# Patient Record
Sex: Male | Born: 1960
Health system: Southern US, Community
[De-identification: ages and names within clinical notes are randomized; demographics above are authoritative.]

## PROBLEM LIST (undated history)

## (undated) DIAGNOSIS — G4733 Obstructive sleep apnea (adult) (pediatric): Secondary | ICD-10-CM

## (undated) DIAGNOSIS — Z9889 Other specified postprocedural states: Secondary | ICD-10-CM

## (undated) DIAGNOSIS — L97509 Non-pressure chronic ulcer of other part of unspecified foot with unspecified severity: Secondary | ICD-10-CM

## (undated) DIAGNOSIS — I10 Essential (primary) hypertension: Secondary | ICD-10-CM

## (undated) DIAGNOSIS — M2040 Other hammer toe(s) (acquired), unspecified foot: Secondary | ICD-10-CM

## (undated) DIAGNOSIS — E119 Type 2 diabetes mellitus without complications: Secondary | ICD-10-CM

## (undated) DIAGNOSIS — I509 Heart failure, unspecified: Secondary | ICD-10-CM

## (undated) DIAGNOSIS — F1210 Cannabis abuse, uncomplicated: Secondary | ICD-10-CM

## (undated) DIAGNOSIS — R0609 Other forms of dyspnea: Secondary | ICD-10-CM

## (undated) DIAGNOSIS — G5603 Carpal tunnel syndrome, bilateral upper limbs: Secondary | ICD-10-CM

## (undated) DIAGNOSIS — K5792 Diverticulitis of intestine, part unspecified, without perforation or abscess without bleeding: Secondary | ICD-10-CM

## (undated) DIAGNOSIS — R748 Abnormal levels of other serum enzymes: Secondary | ICD-10-CM

## (undated) DIAGNOSIS — K219 Gastro-esophageal reflux disease without esophagitis: Secondary | ICD-10-CM

## (undated) DIAGNOSIS — F10930 Alcohol use, unspecified with withdrawal, uncomplicated: Secondary | ICD-10-CM

## (undated) DIAGNOSIS — R0600 Dyspnea, unspecified: Secondary | ICD-10-CM

## (undated) DIAGNOSIS — K760 Fatty (change of) liver, not elsewhere classified: Secondary | ICD-10-CM

## (undated) DIAGNOSIS — E785 Hyperlipidemia, unspecified: Secondary | ICD-10-CM

## (undated) DIAGNOSIS — M1990 Unspecified osteoarthritis, unspecified site: Secondary | ICD-10-CM

## (undated) DIAGNOSIS — M25559 Pain in unspecified hip: Secondary | ICD-10-CM

## (undated) DIAGNOSIS — R112 Nausea with vomiting, unspecified: Secondary | ICD-10-CM

## (undated) DIAGNOSIS — R600 Localized edema: Secondary | ICD-10-CM

## (undated) DIAGNOSIS — F1020 Alcohol dependence, uncomplicated: Secondary | ICD-10-CM

## (undated) DIAGNOSIS — G709 Myoneural disorder, unspecified: Secondary | ICD-10-CM

## (undated) DIAGNOSIS — Z8719 Personal history of other diseases of the digestive system: Secondary | ICD-10-CM

## (undated) HISTORY — DX: Essential (primary) hypertension: I10

## (undated) HISTORY — PX: COLONOSCOPY: SHX174

## (undated) HISTORY — DX: Other specified postprocedural states: Z98.890

## (undated) HISTORY — DX: Personal history of other diseases of the digestive system: Z87.19

## (undated) HISTORY — DX: Diverticulitis of intestine, part unspecified, without perforation or abscess without bleeding: K57.92

## (undated) HISTORY — PX: TOTAL KNEE ARTHROPLASTY: SHX125

## (undated) HISTORY — DX: Unspecified osteoarthritis, unspecified site: M19.90

## (undated) HISTORY — PX: JOINT REPLACEMENT: SHX530

---

## 2012-10-18 HISTORY — PX: BICEPS TENDON REPAIR: SHX566

## 2013-09-17 HISTORY — PX: TOE AMPUTATION: SHX809

## 2013-11-11 DIAGNOSIS — Q6410 Exstrophy of urinary bladder, unspecified: Secondary | ICD-10-CM | POA: Insufficient documentation

## 2013-11-11 DIAGNOSIS — M2040 Other hammer toe(s) (acquired), unspecified foot: Secondary | ICD-10-CM | POA: Insufficient documentation

## 2014-10-18 DIAGNOSIS — Z9889 Other specified postprocedural states: Secondary | ICD-10-CM

## 2014-10-18 DIAGNOSIS — Z8719 Personal history of other diseases of the digestive system: Secondary | ICD-10-CM

## 2014-10-18 HISTORY — DX: Personal history of other diseases of the digestive system: Z98.890

## 2014-10-18 HISTORY — DX: Personal history of other diseases of the digestive system: Z87.19

## 2014-10-18 HISTORY — PX: HERNIA REPAIR: SHX51

## 2016-02-06 DIAGNOSIS — M79642 Pain in left hand: Secondary | ICD-10-CM | POA: Diagnosis not present

## 2016-05-21 DIAGNOSIS — M792 Neuralgia and neuritis, unspecified: Secondary | ICD-10-CM | POA: Diagnosis not present

## 2016-05-21 DIAGNOSIS — L97512 Non-pressure chronic ulcer of other part of right foot with fat layer exposed: Secondary | ICD-10-CM | POA: Diagnosis not present

## 2016-05-21 DIAGNOSIS — M2011 Hallux valgus (acquired), right foot: Secondary | ICD-10-CM | POA: Diagnosis not present

## 2016-05-26 DIAGNOSIS — L97519 Non-pressure chronic ulcer of other part of right foot with unspecified severity: Secondary | ICD-10-CM | POA: Diagnosis not present

## 2016-06-17 DIAGNOSIS — M2011 Hallux valgus (acquired), right foot: Secondary | ICD-10-CM | POA: Diagnosis not present

## 2016-06-17 DIAGNOSIS — L97512 Non-pressure chronic ulcer of other part of right foot with fat layer exposed: Secondary | ICD-10-CM | POA: Diagnosis not present

## 2016-07-05 DIAGNOSIS — L97511 Non-pressure chronic ulcer of other part of right foot limited to breakdown of skin: Secondary | ICD-10-CM | POA: Diagnosis not present

## 2016-11-23 ENCOUNTER — Encounter: Payer: Self-pay | Admitting: Internal Medicine

## 2016-11-23 ENCOUNTER — Ambulatory Visit (INDEPENDENT_AMBULATORY_CARE_PROVIDER_SITE_OTHER): Payer: BLUE CROSS/BLUE SHIELD | Admitting: Internal Medicine

## 2016-11-23 ENCOUNTER — Other Ambulatory Visit (INDEPENDENT_AMBULATORY_CARE_PROVIDER_SITE_OTHER): Payer: BLUE CROSS/BLUE SHIELD

## 2016-11-23 VITALS — BP 158/104 | HR 77 | Temp 98.0°F | Resp 18 | Ht 73.0 in | Wt 304.0 lb

## 2016-11-23 DIAGNOSIS — IMO0002 Reserved for concepts with insufficient information to code with codable children: Secondary | ICD-10-CM

## 2016-11-23 DIAGNOSIS — G629 Polyneuropathy, unspecified: Secondary | ICD-10-CM | POA: Diagnosis not present

## 2016-11-23 DIAGNOSIS — R200 Anesthesia of skin: Secondary | ICD-10-CM

## 2016-11-23 DIAGNOSIS — Z89421 Acquired absence of other right toe(s): Secondary | ICD-10-CM | POA: Diagnosis not present

## 2016-11-23 DIAGNOSIS — Z23 Encounter for immunization: Secondary | ICD-10-CM

## 2016-11-23 DIAGNOSIS — R202 Paresthesia of skin: Secondary | ICD-10-CM

## 2016-11-23 DIAGNOSIS — G4733 Obstructive sleep apnea (adult) (pediatric): Secondary | ICD-10-CM

## 2016-11-23 DIAGNOSIS — I10 Essential (primary) hypertension: Secondary | ICD-10-CM

## 2016-11-23 DIAGNOSIS — Z8739 Personal history of other diseases of the musculoskeletal system and connective tissue: Secondary | ICD-10-CM | POA: Insufficient documentation

## 2016-11-23 LAB — COMPREHENSIVE METABOLIC PANEL
ALBUMIN: 4.2 g/dL (ref 3.5–5.2)
ALT: 126 U/L — ABNORMAL HIGH (ref 0–53)
AST: 126 U/L — ABNORMAL HIGH (ref 0–37)
Alkaline Phosphatase: 106 U/L (ref 39–117)
BUN: 10 mg/dL (ref 6–23)
CALCIUM: 10 mg/dL (ref 8.4–10.5)
CO2: 28 mEq/L (ref 19–32)
Chloride: 104 mEq/L (ref 96–112)
Creatinine, Ser: 0.86 mg/dL (ref 0.40–1.50)
GFR: 98.01 mL/min (ref >60.00)
Glucose, Bld: 143 mg/dL — ABNORMAL HIGH (ref 70–99)
POTASSIUM: 4.1 meq/L (ref 3.5–5.1)
Sodium: 138 mEq/L (ref 135–145)
TOTAL PROTEIN: 8.2 g/dL (ref 6.0–8.3)
Total Bilirubin: 1 mg/dL (ref 0.2–1.2)

## 2016-11-23 LAB — CBC WITH DIFFERENTIAL/PLATELET
Basophils Absolute: 0.1 10*3/uL (ref 0.0–0.1)
Basophils Relative: 0.9 % (ref 0.0–3.0)
EOS PCT: 1.2 % (ref 0.0–5.0)
Eosinophils Absolute: 0.1 10*3/uL (ref 0.0–0.7)
HCT: 48.5 % (ref 39.0–52.0)
HEMOGLOBIN: 16.8 g/dL (ref 13.0–17.0)
Lymphocytes Relative: 20.5 % (ref 12.0–46.0)
Lymphs Abs: 1.6 10*3/uL (ref 0.7–4.0)
MCHC: 34.7 g/dL (ref 30.0–36.0)
MCV: 105.3 fl — AB (ref 78.0–100.0)
MONOS PCT: 10.4 % (ref 3.0–12.0)
Monocytes Absolute: 0.8 10*3/uL (ref 0.1–1.0)
Neutro Abs: 5.3 10*3/uL (ref 1.4–7.7)
Neutrophils Relative %: 67 % (ref 43.0–77.0)
Platelets: 281 10*3/uL (ref 150.0–400.0)
RBC: 4.61 Mil/uL (ref 4.22–5.81)
RDW: 13.9 % (ref 11.5–15.5)
WBC: 7.9 10*3/uL (ref 4.0–10.5)

## 2016-11-23 LAB — VITAMIN D 25 HYDROXY (VIT D DEFICIENCY, FRACTURES): VITD: 16.46 ng/mL — AB (ref 30.00–100.00)

## 2016-11-23 LAB — LIPID PANEL
Cholesterol: 255 mg/dL — ABNORMAL HIGH (ref 0–200)
HDL: 49.7 mg/dL (ref >39.00)
LDL Cholesterol: 177 mg/dL — ABNORMAL HIGH (ref 0–99)
NONHDL: 204.93
TRIGLYCERIDES: 142 mg/dL (ref 0.0–149.0)
Total CHOL/HDL Ratio: 5
VLDL: 28.4 mg/dL (ref 0.0–40.0)

## 2016-11-23 LAB — HEMOGLOBIN A1C: HEMOGLOBIN A1C: 6.4 % (ref 4.6–6.5)

## 2016-11-23 LAB — VITAMIN B12: Vitamin B-12: 356 pg/mL (ref 211–911)

## 2016-11-23 LAB — TSH: TSH: 3.28 u[IU]/mL (ref 0.35–4.50)

## 2016-11-23 NOTE — Assessment & Plan Note (Addendum)
Stressed regular exercise and weight loss Check a1c and basic labs

## 2016-11-23 NOTE — Progress Notes (Signed)
Pre visit review using our clinic review tool, if applicable. No additional management support is needed unless otherwise documented below in the visit note. 

## 2016-11-23 NOTE — Progress Notes (Signed)
Subjective:    Patient ID: [name], male    DOB: 10/02/1961, 56 y.o.   MRN: [id number]  HPI He is here to establish with a new pcp.    Numbness/tingling in left hand:  It started two years ago.  His prior pcp thought it was carpal tunnel.  He has weakness in his first two first fingers.  The numbness has worsened and now goes up his arm. He did have an EMG 1-2 years ago diagnosed with CTS, but he thinks it is coming from his neck.  If he lays down at night it is worse.    Weight gain, obese:  Since July he has gained 50 lbs.  He is less active and has not been eating well.   Hypertension: He is taking his medication daily. He is compliant with a low sodium diet.  He has had leg edema for two months.  He denies chest pain, palpitations, shortness of breath and regular headaches. He is not exercising regularly.  He does not monitor his blood pressure at home.     Medications and allergies reviewed with patient and updated if appropriate.  Patient Active Problem List   Diagnosis Date Noted  . Hypertension 11/23/2016  . Neuropathy (HCC) 11/23/2016  . Toe amputation status, right (HCC) 11/23/2016    No current outpatient prescriptions on file prior to visit.   No current facility-administered medications on file prior to visit.     Past Medical History:  Diagnosis Date  . Arthritis   . Diverticulitis   . Hypertension     Past Surgical History:  Procedure Laterality Date  . HERNIA REPAIR    . JOINT REPLACEMENT      Social History   Social History  . Marital status: Divorced    Spouse name: N/A  . Number of children: N/A  . Years of education: N/A   Social History Main Topics  . Smoking status: Former Games developermoker  . Smokeless tobacco: Never Used  . Alcohol use Yes  . Drug use: No  . Sexual activity: Not Asked   Other Topics Concern  . None   Social History Narrative  . None    History reviewed. No pertinent family history.  Review of Systems    Constitutional: Negative for chills and fever.  HENT: Positive for postnasal drip (in morning).   Eyes: Negative for visual disturbance.  Respiratory: Positive for cough (in morning from PND). Negative for shortness of breath and wheezing.   Cardiovascular: Positive for leg swelling (daily for a couple of months). Negative for chest pain and palpitations.  Gastrointestinal: Negative for abdominal pain, blood in stool, constipation, diarrhea and nausea.       Occ gerd  Endocrine: Negative for polydipsia and polyuria.  Genitourinary: Negative for dysuria and hematuria.  Musculoskeletal: Positive for back pain.  Neurological: Positive for weakness (left hand) and numbness (b/l feet, left hand). Negative for dizziness, light-headedness and headaches.  Psychiatric/Behavioral: Positive for dysphoric mood (from not working). The patient is not nervous/anxious.        Objective:   Vitals:   11/23/16 1028  BP: (!) 158/104  Pulse: 77  Resp: 18  Temp: 98 F (36.7 C)   Filed Weights   11/23/16 1028  Weight: (!) 304 lb (137.9 kg)   Body mass index is 40.11 kg/m.  Wt Readings from Last 3 Encounters:  11/23/16 (!) 304 lb (137.9 kg)     Physical Exam Constitutional: He appears well-developed and well-nourished.  No distress.  HENT:  Head: Normocephalic and atraumatic.  Right Ear: External ear normal.  Left Ear: External ear normal.  Mouth/Throat: Oropharynx is clear and moist.  Normal ear canals and TM b/l  Eyes: Conjunctivae and EOM are normal.  Neck: Neck supple. No tracheal deviation present. No thyromegaly present.  No carotid bruit  Cardiovascular: Normal rate, regular rhythm, normal heart sounds and intact distal pulses.   No murmur heard. Pulmonary/Chest: Effort normal and breath sounds normal. No respiratory distress. He has no wheezes. He has no rales.  Abdominal: Soft. Obese, ventral hernia. He exhibits no distension. There is no tenderness.  Genitourinary: deferred   Musculoskeletal: He exhibits 1+ pitting b/l le edema.  Lymphadenopathy:    He has no cervical adenopathy.  Skin: Skin is warm and dry. He is not diaphoretic.  Psychiatric: He has a normal mood and affect. His behavior is normal.         Assessment & Plan:   Colonoscopy 2016  See Problem List for Assessment and Plan of chronic medical problems.

## 2016-11-23 NOTE — Assessment & Plan Note (Signed)
Stressed weight loss Discussed concerns of untreated OSA

## 2016-11-23 NOTE — Assessment & Plan Note (Signed)
Elevated today - he does not check at home  ? Controlled Start monitoring at home Work on increasing exercise, weight loss Low sodium diet Check labs

## 2016-11-23 NOTE — Assessment & Plan Note (Signed)
Check basic labs and B12 Work on weight loss

## 2016-11-23 NOTE — Patient Instructions (Addendum)
  Test(s) ordered today. Your results will be released to MyChart (or called to you) after review, usually within 72hours after test completion. If any changes need to be made, you will be notified at that same time.   Tetanus immunization administered today.   Medications reviewed and updated.  No changes recommended at this time.   A referral was ordered for orthopedics  Please followup in 1 months

## 2016-11-23 NOTE — Assessment & Plan Note (Signed)
Has been diagnosed with CTS by EMG - he feels it is likely from his neck  Will refer to ortho - most likely he has CTS, ? Some degree of cervical radiculopathy

## 2016-11-25 ENCOUNTER — Other Ambulatory Visit: Payer: Self-pay | Admitting: Internal Medicine

## 2016-11-25 DIAGNOSIS — E7800 Pure hypercholesterolemia, unspecified: Secondary | ICD-10-CM

## 2016-11-25 DIAGNOSIS — R748 Abnormal levels of other serum enzymes: Secondary | ICD-10-CM | POA: Insufficient documentation

## 2016-11-25 DIAGNOSIS — E785 Hyperlipidemia, unspecified: Secondary | ICD-10-CM | POA: Insufficient documentation

## 2016-11-25 DIAGNOSIS — R7303 Prediabetes: Secondary | ICD-10-CM | POA: Insufficient documentation

## 2016-11-25 DIAGNOSIS — E119 Type 2 diabetes mellitus without complications: Secondary | ICD-10-CM

## 2016-12-02 ENCOUNTER — Ambulatory Visit (INDEPENDENT_AMBULATORY_CARE_PROVIDER_SITE_OTHER): Payer: BLUE CROSS/BLUE SHIELD | Admitting: Orthopaedic Surgery

## 2016-12-02 ENCOUNTER — Encounter (INDEPENDENT_AMBULATORY_CARE_PROVIDER_SITE_OTHER): Payer: Self-pay | Admitting: Orthopaedic Surgery

## 2016-12-02 ENCOUNTER — Ambulatory Visit (INDEPENDENT_AMBULATORY_CARE_PROVIDER_SITE_OTHER): Payer: BLUE CROSS/BLUE SHIELD

## 2016-12-02 ENCOUNTER — Ambulatory Visit (INDEPENDENT_AMBULATORY_CARE_PROVIDER_SITE_OTHER): Payer: Self-pay

## 2016-12-02 DIAGNOSIS — M79641 Pain in right hand: Secondary | ICD-10-CM | POA: Diagnosis not present

## 2016-12-02 DIAGNOSIS — M79642 Pain in left hand: Secondary | ICD-10-CM | POA: Diagnosis not present

## 2016-12-02 NOTE — Progress Notes (Signed)
Office Visit Note   Patient: [name]           Date of Birth: 07/07/1961           MRN: [id number] Visit Date: 12/02/2016              Requested by: Pincus SanesStacy J Burns, MD [address] [place], [place] [id number] PCP: Pincus SanesStacy J Burns, MD   Assessment & Plan: Visit Diagnoses:  1. Bilateral hand pain     Plan: Nerve conduction study with Dr. Alvester MorinNewton to assess severity. We will have him follow-up afterwards to discuss treatment options.  Follow-Up Instructions: Return in about 7 days (around 12/09/2016).   Orders:  Orders Placed This Encounter  Procedures  . XR Hand Complete Left  . XR Hand Complete Right  . Ambulatory referral to Physical Medicine Rehab   No orders of the defined types were placed in this encounter.     Procedures: No procedures performed   Clinical Data: No additional findings.   Subjective: Chief Complaint  Patient presents with  . Left Hand - Pain, Tingling, Numbness  . Right Hand - Pain, Numbness, Tingling    Patient is a 56 year old gentleman with 2 year history of bilateral hand pain and numbness tingling burning that has been previously diagnosed with carpal tunnel syndrome by nerve conduction studies. He has had injections with good relief. He has not gotten any relief from wearing braces at night. The pain is getting worse. He starting to get numbness in his left hand and decreased protective sensation. The pain radiates up the arm. Symptoms are worse with repetitive use of the hand. He is a Investment banker, operationalchef.    Review of Systems  Constitutional: Negative.   All other systems reviewed and are negative.    Objective: Vital Signs: There were no vitals taken for this visit.  Physical Exam  Constitutional: He is oriented to person, place, and time. He appears well-developed and well-nourished.  HENT:  Head: Normocephalic and atraumatic.  Eyes: Pupils are equal, round, and reactive to light.  Neck: Neck supple.  Pulmonary/Chest: Effort normal.    Abdominal: Soft.  Musculoskeletal: Normal range of motion.  Neurological: He is alert and oriented to person, place, and time.  Skin: Skin is warm.  Psychiatric: He has a normal mood and affect. His behavior is normal. Judgment and thought content normal.  Nursing note and vitals reviewed.   Ortho Exam Exam by hand shows no muscular atrophy. He does have positive carpal tunnel tunnel compressive signs. He does have a burn to his left index finger which she said he has decreased sensation with. Specialty Comments:  No specialty comments available.  Imaging: Xr Hand Complete Left  Result Date: 12/02/2016 No acute findings.  Mild OA of thumb CMC joint  Xr Hand Complete Right  Result Date: 12/02/2016 No acute findings.  Mild OA of thumb CMC joint    PMFS History: Patient Active Problem List   Diagnosis Date Noted  . Hyperlipidemia 11/25/2016  . Elevated liver enzymes 11/25/2016  . Prediabetes 11/25/2016  . Hypertension 11/23/2016  . Neuropathy (HCC) 11/23/2016  . Toe amputation status, right (HCC) 11/23/2016  . Morbid obesity (HCC) 11/23/2016  . Numbness and tingling in left hand 11/23/2016  . OSA (obstructive sleep apnea) 11/23/2016   Past Medical History:  Diagnosis Date  . Arthritis   . Diverticulitis    portion of colon removed  . History of ventral hernia repair 2016  . Hypertension  Family History  Problem Relation Age of Onset  . Adopted: Yes  . Family history unknown: Yes    Past Surgical History:  Procedure Laterality Date  . HERNIA REPAIR  2016   ventral  . JOINT REPLACEMENT     b/l knees    Social History   Occupational History  . Not on file.   Social History Main Topics  . Smoking status: Former Games developer  . Smokeless tobacco: Never Used  . Alcohol use Yes     Comment: 10 / week  . Drug use: No  . Sexual activity: Not on file

## 2016-12-06 ENCOUNTER — Ambulatory Visit (INDEPENDENT_AMBULATORY_CARE_PROVIDER_SITE_OTHER): Payer: Self-pay | Admitting: Orthopaedic Surgery

## 2016-12-07 ENCOUNTER — Encounter: Payer: Self-pay | Admitting: Internal Medicine

## 2016-12-08 ENCOUNTER — Telehealth (INDEPENDENT_AMBULATORY_CARE_PROVIDER_SITE_OTHER): Payer: Self-pay | Admitting: Physical Medicine and Rehabilitation

## 2016-12-08 ENCOUNTER — Encounter (INDEPENDENT_AMBULATORY_CARE_PROVIDER_SITE_OTHER): Payer: BLUE CROSS/BLUE SHIELD | Admitting: Physical Medicine and Rehabilitation

## 2016-12-08 NOTE — Telephone Encounter (Signed)
Patient called and said he forgot his appointment for today at 3:15. He was wondering if he could schedule another appointment in the next few days. [phone]

## 2016-12-09 NOTE — Telephone Encounter (Signed)
Rescheduled for 2/26 at 1430.

## 2016-12-09 NOTE — Telephone Encounter (Signed)
Called patient and left a message for him to call back to reschedule.

## 2016-12-13 ENCOUNTER — Encounter (INDEPENDENT_AMBULATORY_CARE_PROVIDER_SITE_OTHER): Payer: Self-pay | Admitting: Physical Medicine and Rehabilitation

## 2016-12-13 ENCOUNTER — Ambulatory Visit (INDEPENDENT_AMBULATORY_CARE_PROVIDER_SITE_OTHER): Payer: BLUE CROSS/BLUE SHIELD | Admitting: Orthopaedic Surgery

## 2016-12-13 ENCOUNTER — Ambulatory Visit (INDEPENDENT_AMBULATORY_CARE_PROVIDER_SITE_OTHER): Payer: BLUE CROSS/BLUE SHIELD | Admitting: Physical Medicine and Rehabilitation

## 2016-12-13 DIAGNOSIS — R202 Paresthesia of skin: Secondary | ICD-10-CM

## 2016-12-13 NOTE — Progress Notes (Signed)
[name] - 56 y.o. male MRN [id number]  Date of birth: Feb 20, 1961  Office Visit Note: Visit Date: 12/13/2016 PCP: [name], MD Referred by: [name], MD  Subjective: Chief Complaint  Patient presents with  . Right Hand - Numbness  . Left Hand - Numbness   HPI: Mr. Khim is a 56 year old left-hand dominant woman with chronic worsening persistent pain numbness and tingling in both hands. He gets a lot of numbness and pain that will go up from the hand to the elbow particularly on the left. His left thumb and index finger is constantly numb at this point. He has difficulty picking up small objects and manipulating buttons etc. he states his symptoms probably started about a year ago and has been getting progressively worse. He has tried hand splints before and anti-inflammatories and pain medication without relief. He feels like he did have prior electrodiagnostic studies several years ago but does not have those studies on hand. He does carry a diagnosis of poly-peripheral neuropathy but does not have diabetes. He actually reports having to have a toe amputated because of nonhealing ulcer.   ROS Otherwise per HPI.  Assessment & Plan: Visit Diagnoses:  1. Paresthesia of skin     Plan: No additional findings.  Impression: The above electrodiagnostic study is ABNORMAL and reveals evidence of:  1.  A severe left median nerve entrapment at the wrist (carpal tunnel syndrome) affecting sensory and motor components. The lesion is characterized by sensory and motor demyelination with evidence of significant axonal injury. **Even with appropriate decompression residual symptoms may persist.  2.  A moderate to severe right median nerve entrapment at the wrist (carpal tunnel syndrome) affecting sensory and motor components.There is no significant electrodiagnostic evidence of any other focal nerve entrapment, brachial plexopathy or cervical radiculopathy.    Recommendations: 1.   Follow-up with referring physician. 2.  Continue current management of symptoms. 3.  Suggest surgical evaluation.   Meds & Orders: No orders of the defined types were placed in this encounter.   Orders Placed This Encounter  Procedures  . NCV with EMG (electromyography)    Follow-up: Return for Scheduled follow up with Dr. Roda Shutters.   Procedures: No procedures performed  EMG & NCV Findings: Evaluation of the left median motor nerve showed prolonged distal onset latency (17.3 ms) and reduced amplitude (0.4 mV).  The right median motor nerve showed prolonged distal onset latency (9.0 ms) and decreased conduction velocity (Elbow-Wrist, 44 m/s).  The left median (across palm) sensory nerve showed no response (Wrist) and prolonged distal peak latency (Palm, 7.5 ms).  The right median (across palm) sensory nerve showed prolonged distal peak latency (Wrist, 9.8 ms), reduced amplitude (1.5 V), and prolonged distal peak latency (Palm, 5.1 ms).  The left ulnar sensory and the right ulnar sensory nerves showed reduced amplitude (L11.1, R5.6 V).  All remaining nerves (as indicated in the following tables) were within normal limits.  Left vs. Right side comparison data for the median motor nerve indicates abnormal L-R latency difference (8.3 ms) and abnormal L-R amplitude difference (94.4 %).  The ulnar motor nerve indicates abnormal L-R amplitude difference (31.4 %) and abnormal L-R velocity difference (A Elbow-B Elbow, 22 m/s).  All remaining left vs. right side differences were within normal limits.    Needle evaluation of the left abductor pollicis brevis muscle showed decreased insertional activity, moderately increased spontaneous activity, decreased motor unit amplitude, and diminished recruitment.  All remaining muscles (as indicated in  the following table) showed no evidence of electrical instability.    Impression: The above electrodiagnostic study is ABNORMAL and reveals evidence of:  1.  A severe  left median nerve entrapment at the wrist (carpal tunnel syndrome) affecting sensory and motor components. The lesion is characterized by sensory and motor demyelination with evidence of significant axonal injury. **Even with appropriate decompression residual symptoms may persist.  2.  A moderate to severe right median nerve entrapment at the wrist (carpal tunnel syndrome) affecting sensory and motor components.There is no significant electrodiagnostic evidence of any other focal nerve entrapment, brachial plexopathy or cervical radiculopathy.    Recommendations: 1.  Follow-up with referring physician. 2.  Continue current management of symptoms. 3.  Suggest surgical evaluation.   Nerve Conduction Studies Anti Sensory Summary Table   Stim Site NR Peak (ms) Norm Peak (ms) P-T Amp (V) Norm P-T Amp Site1 Site2 Delta-P (ms) Dist (cm) Vel (m/s) Norm Vel (m/s)  Left Median Acr Palm Anti Sensory (2nd Digit)  32.6C  Wrist *NR  <3.6  >10 Wrist Palm  0.0    Palm    *7.5 <2.0 18.3         Right Median Acr Palm Anti Sensory (2nd Digit)  31.7C  Wrist    *9.8 <3.6 *1.5 >10 Wrist Palm 4.7 0.0    Palm    *5.1 <2.0 23.7         Left Radial Anti Sensory (Base 1st Digit)  32.1C  Wrist    2.1 <3.1 16.7  Wrist Base 1st Digit 2.1 0.0    Right Radial Anti Sensory (Base 1st Digit)  32.4C  Wrist    2.2 <3.1 15.5  Wrist Base 1st Digit 2.2 0.0    Left Ulnar Anti Sensory (5th Digit)  32.6C  Wrist    3.4 <3.7 *11.1 >15.0 Wrist 5th Digit 3.4 14.0 41 >38  Right Ulnar Anti Sensory (5th Digit)  31.8C  Wrist    3.3 <3.7 *5.6 >15.0 Wrist 5th Digit 3.3 14.0 42 >38   Motor Summary Table   Stim Site NR Onset (ms) Norm Onset (ms) O-P Amp (mV) Norm O-P Amp Site1 Site2 Delta-0 (ms) Dist (cm) Vel (m/s) Norm Vel (m/s)  Left Median Motor (Abd Poll Brev)  32C  Wrist    *17.3 <4.2 *0.4 >5 Elbow Wrist 7.4 0.0  >50  Elbow    24.7  0.2         Right Median Motor (Abd Poll Brev)  32.3C  Wrist    *9.0 <4.2 7.2 >5 Elbow  Wrist 5.4 24.0 *44 >50  Elbow    14.4  4.6         Left Ulnar Motor (Abd Dig Min)  31.9C  Wrist    2.8 <4.2 8.6 >3 B Elbow Wrist 4.3 25.0 58 >53  B Elbow    7.1  8.2  A Elbow B Elbow 1.7 12.0 71 >53  A Elbow    8.8  8.0         Right Ulnar Motor (Abd Dig Min)  32.1C  Wrist    3.4 <4.2 5.9 >3 B Elbow Wrist 4.5 26.0 58 >53  B Elbow    7.9  6.6  A Elbow B Elbow 1.5 14.0 93 >53  A Elbow    9.4  8.1          EMG   Side Muscle Nerve Root Ins Act Fibs Psw Amp Dur Poly Recrt Int [name] Bible Comment  Left Abd [place]  Brev Median C8-T1 *Decr *2+ *2+ *Decr Nml 0 *Reduced Nml   Left 1stDorInt Ulnar C8-T1 Nml Nml Nml Nml Nml 0 Nml Nml   Left PronatorTeres Median C6-7 Nml Nml Nml Nml Nml 0 Nml Nml   Left Biceps Musculocut C5-6 Nml Nml Nml Nml Nml 0 Nml Nml   Left Deltoid Axillary C5-6 Nml Nml Nml Nml Nml 0 Nml Nml     Nerve Conduction Studies Anti Sensory Left/Right Comparison   Stim Site L Lat (ms) R Lat (ms) L-R Lat (ms) L Amp (V) R Amp (V) L-R Amp (%) Site1 Site2 L Vel (m/s) R Vel (m/s) L-R Vel (m/s)  Median Acr Palm Anti Sensory (2nd Digit)  32.6C  Wrist  *9.8   *1.5  Wrist Palm     Palm *7.5 *5.1 2.4 18.3 23.7 22.8       Radial Anti Sensory (Base 1st Digit)  32.1C  Wrist 2.1 2.2 0.1 16.7 15.5 7.2 Wrist Base 1st Digit     Ulnar Anti Sensory (5th Digit)  32.6C  Wrist 3.4 3.3 0.1 *11.1 *5.6 49.5 Wrist 5th Digit 41 42 1   Motor Left/Right Comparison   Stim Site L Lat (ms) R Lat (ms) L-R Lat (ms) L Amp (mV) R Amp (mV) L-R Amp (%) Site1 Site2 L Vel (m/s) R Vel (m/s) L-R Vel (m/s)  Median Motor (Abd Poll Brev)  32C  Wrist *17.3 *9.0 *8.3 *0.4 7.2 *94.4 Elbow Wrist  *44   Elbow 24.7 14.4 10.3 0.2 4.6 95.7       Ulnar Motor (Abd Dig Min)  31.9C  Wrist 2.8 3.4 0.6 8.6 5.9 *31.4 B Elbow Wrist 58 58 0  B Elbow 7.1 7.9 0.8 8.2 6.6 19.5 A Elbow B Elbow 71 93 *22  A Elbow 8.8 9.4 0.6 8.0 8.1 1.2             Clinical History: No specialty comments available.  He reports that he has quit  smoking. He has never used smokeless tobacco.   Recent Labs  11/23/16 1118  HGBA1C 6.4    Objective:  VS:  HT:    WT:   BMI:     BP:   HR: bpm  TEMP: ( )  RESP:  Physical Exam  Musculoskeletal:  Morbidly obese large individual with observed osteoarthritic changes in both hands as well as mild flattening of the right APB with atrophy of the left APB without atrophy of the FDI or hand intrinsics. There is no swelling, color changes, allodynia or dystrophic changes. There is 5 out of 5 strength in the bilateral wrist extension, finger abduction and long finger flexion. There is decreased sensation to light touch in a median nerve distribution left more than right. There is a negative Hoffmann's test bilaterally.    Ortho Exam Imaging: [place] Abdomen Limited Ruq  Result Date: 12/14/2016 CLINICAL DATA:  Elevated liver enzymes EXAM: US ABDOMEN LIMITED - RIGHT UPPER QUADRANT COMPARISON:  None. FINDINGS: Gallbladder: No gallstones or wall thickening visualized. No pericholecystic fluid. No sonographic Murphy sign noted by sonographer. Common bile duct: Diameter: 4 mm. No intrahepatic or extrahepatic biliary duct dilatation evident. Liver: No focal lesion identified. Liver echogenicity is increased diffusely. IMPRESSION: Diffuse increase in liver echogenicity, a finding most likely due to hepatic steatosis. While no focal liver lesions are evident, it must be cautioned that sensitivity of ultrasound for detection of focal liver lesions is diminished in this circumstance. Study otherwise unremarkable. Electronically Signed   By: Bretta BangWilliam  Woodruff  III M.D.   On: 12/14/2016 11:05    Past Medical/Family/Surgical/Social History: Medications & Allergies reviewed per EMR Patient Active Problem List   Diagnosis Date Noted  . Hyperlipidemia 11/25/2016  . Elevated liver enzymes 11/25/2016  . Prediabetes 11/25/2016  . Hypertension 11/23/2016  . Neuropathy (HCC) 11/23/2016  . Toe amputation status, right  (HCC) 11/23/2016  . Morbid obesity (HCC) 11/23/2016  . Numbness and tingling in left hand 11/23/2016  . OSA (obstructive sleep apnea) 11/23/2016   Past Medical History:  Diagnosis Date  . Arthritis   . Diverticulitis    portion of colon removed  . History of ventral hernia repair 2016  . Hypertension    Family History  Problem Relation Age of Onset  . Adopted: Yes  . Family history unknown: Yes   Past Surgical History:  Procedure Laterality Date  . HERNIA REPAIR  2016   ventral  . JOINT REPLACEMENT     b/l knees    Social History   Occupational History  . Not on file.   Social History Main Topics  . Smoking status: Former Games developer  . Smokeless tobacco: Never Used  . Alcohol use Yes     Comment: 10 / week  . Drug use: No  . Sexual activity: Not on file

## 2016-12-14 ENCOUNTER — Ambulatory Visit
Admission: RE | Admit: 2016-12-14 | Discharge: 2016-12-14 | Disposition: A | Discharge status: Home / Self Care | Payer: BLUE CROSS/BLUE SHIELD | Source: Ambulatory Visit | Attending: Internal Medicine | Admitting: Internal Medicine

## 2016-12-14 ENCOUNTER — Encounter (INDEPENDENT_AMBULATORY_CARE_PROVIDER_SITE_OTHER): Payer: BLUE CROSS/BLUE SHIELD | Admitting: Physical Medicine and Rehabilitation

## 2016-12-14 ENCOUNTER — Encounter: Payer: Self-pay | Admitting: Internal Medicine

## 2016-12-14 DIAGNOSIS — R748 Abnormal levels of other serum enzymes: Secondary | ICD-10-CM

## 2016-12-14 DIAGNOSIS — K760 Fatty (change of) liver, not elsewhere classified: Secondary | ICD-10-CM | POA: Insufficient documentation

## 2016-12-14 DIAGNOSIS — R945 Abnormal results of liver function studies: Secondary | ICD-10-CM | POA: Diagnosis not present

## 2016-12-14 NOTE — Procedures (Signed)
EMG & NCV Findings: Evaluation of the left median motor nerve showed prolonged distal onset latency (17.3 ms) and reduced amplitude (0.4 mV).  The right median motor nerve showed prolonged distal onset latency (9.0 ms) and decreased conduction velocity (Elbow-Wrist, 44 m/s).  The left median (across palm) sensory nerve showed no response (Wrist) and prolonged distal peak latency (Palm, 7.5 ms).  The right median (across palm) sensory nerve showed prolonged distal peak latency (Wrist, 9.8 ms), reduced amplitude (1.5 V), and prolonged distal peak latency (Palm, 5.1 ms).  The left ulnar sensory and the right ulnar sensory nerves showed reduced amplitude (L11.1, R5.6 V).  All remaining nerves (as indicated in the following tables) were within normal limits.  Left vs. Right side comparison data for the median motor nerve indicates abnormal L-R latency difference (8.3 ms) and abnormal L-R amplitude difference (94.4 %).  The ulnar motor nerve indicates abnormal L-R amplitude difference (31.4 %) and abnormal L-R velocity difference (A Elbow-B Elbow, 22 m/s).  All remaining left vs. right side differences were within normal limits.    Needle evaluation of the left abductor pollicis brevis muscle showed decreased insertional activity, moderately increased spontaneous activity, decreased motor unit amplitude, and diminished recruitment.  All remaining muscles (as indicated in the following table) showed no evidence of electrical instability.    Impression: The above electrodiagnostic study is ABNORMAL and reveals evidence of:  1.  A severe left median nerve entrapment at the wrist (carpal tunnel syndrome) affecting sensory and motor components. The lesion is characterized by sensory and motor demyelination with evidence of significant axonal injury. **Even with appropriate decompression residual symptoms may persist.  2.  A moderate to severe right median nerve entrapment at the wrist (carpal tunnel syndrome)  affecting sensory and motor components.There is no significant electrodiagnostic evidence of any other focal nerve entrapment, brachial plexopathy or cervical radiculopathy.    Recommendations: 1.  Follow-up with referring physician. 2.  Continue current management of symptoms. 3.  Suggest surgical evaluation.   Nerve Conduction Studies Anti Sensory Summary Table   Stim Site NR Peak (ms) Norm Peak (ms) P-T Amp (V) Norm P-T Amp Site1 Site2 Delta-P (ms) Dist (cm) Vel (m/s) Norm Vel (m/s)  Left Median Acr Palm Anti Sensory (2nd Digit)  32.6C  Wrist *NR  <3.6  >10 Wrist Palm  0.0    Palm    *7.5 <2.0 18.3         Right Median Acr Palm Anti Sensory (2nd Digit)  31.7C  Wrist    *9.8 <3.6 *1.5 >10 Wrist Palm 4.7 0.0    Palm    *5.1 <2.0 23.7         Left Radial Anti Sensory (Base 1st Digit)  32.1C  Wrist    2.1 <3.1 16.7  Wrist Base 1st Digit 2.1 0.0    Right Radial Anti Sensory (Base 1st Digit)  32.4C  Wrist    2.2 <3.1 15.5  Wrist Base 1st Digit 2.2 0.0    Left Ulnar Anti Sensory (5th Digit)  32.6C  Wrist    3.4 <3.7 *11.1 >15.0 Wrist 5th Digit 3.4 14.0 41 >38  Right Ulnar Anti Sensory (5th Digit)  31.8C  Wrist    3.3 <3.7 *5.6 >15.0 Wrist 5th Digit 3.3 14.0 42 >38   Motor Summary Table   Stim Site NR Onset (ms) Norm Onset (ms) O-P Amp (mV) Norm O-P Amp Site1 Site2 Delta-0 (ms) Dist (cm) Vel (m/s) Norm Vel (m/s)  Left Median Motor (Abd  Poll Brev)  32C  Wrist    *17.3 <4.2 *0.4 >5 Elbow Wrist 7.4 0.0  >50  Elbow    24.7  0.2         Right Median Motor (Abd Poll Brev)  32.3C  Wrist    *9.0 <4.2 7.2 >5 Elbow Wrist 5.4 24.0 *44 >50  Elbow    14.4  4.6         Left Ulnar Motor (Abd Dig Min)  31.9C  Wrist    2.8 <4.2 8.6 >3 B Elbow Wrist 4.3 25.0 58 >53  B Elbow    7.1  8.2  A Elbow B Elbow 1.7 12.0 71 >53  A Elbow    8.8  8.0         Right Ulnar Motor (Abd Dig Min)  32.1C  Wrist    3.4 <4.2 5.9 >3 B Elbow Wrist 4.5 26.0 58 >53  B Elbow    7.9  6.6  A Elbow B Elbow 1.5 14.0  93 >53  A Elbow    9.4  8.1          EMG   Side Muscle Nerve Root Ins Act Fibs Psw Amp Dur Poly Recrt Int [name] BiblePat Comment  Left Abd Poll Brev Median C8-T1 *Decr *2+ *2+ *Decr Nml 0 *Reduced Nml   Left 1stDorInt Ulnar C8-T1 Nml Nml Nml Nml Nml 0 Nml Nml   Left PronatorTeres Median C6-7 Nml Nml Nml Nml Nml 0 Nml Nml   Left Biceps Musculocut C5-6 Nml Nml Nml Nml Nml 0 Nml Nml   Left Deltoid Axillary C5-6 Nml Nml Nml Nml Nml 0 Nml Nml     Nerve Conduction Studies Anti Sensory Left/Right Comparison   Stim Site L Lat (ms) R Lat (ms) L-R Lat (ms) L Amp (V) R Amp (V) L-R Amp (%) Site1 Site2 L Vel (m/s) R Vel (m/s) L-R Vel (m/s)  Median Acr Palm Anti Sensory (2nd Digit)  32.6C  Wrist  *9.8   *1.5  Wrist Palm     Palm *7.5 *5.1 2.4 18.3 23.7 22.8       Radial Anti Sensory (Base 1st Digit)  32.1C  Wrist 2.1 2.2 0.1 16.7 15.5 7.2 Wrist Base 1st Digit     Ulnar Anti Sensory (5th Digit)  32.6C  Wrist 3.4 3.3 0.1 *11.1 *5.6 49.5 Wrist 5th Digit 41 42 1   Motor Left/Right Comparison   Stim Site L Lat (ms) R Lat (ms) L-R Lat (ms) L Amp (mV) R Amp (mV) L-R Amp (%) Site1 Site2 L Vel (m/s) R Vel (m/s) L-R Vel (m/s)  Median Motor (Abd Poll Brev)  32C  Wrist *17.3 *9.0 *8.3 *0.4 7.2 *94.4 Elbow Wrist  *44   Elbow 24.7 14.4 10.3 0.2 4.6 95.7       Ulnar Motor (Abd Dig Min)  31.9C  Wrist 2.8 3.4 0.6 8.6 5.9 *31.4 B Elbow Wrist 58 58 0  B Elbow 7.1 7.9 0.8 8.2 6.6 19.5 A Elbow B Elbow 71 93 *22  A Elbow 8.8 9.4 0.6 8.0 8.1 1.2

## 2016-12-15 ENCOUNTER — Encounter: Payer: Self-pay | Admitting: Internal Medicine

## 2016-12-15 DIAGNOSIS — G5603 Carpal tunnel syndrome, bilateral upper limbs: Secondary | ICD-10-CM | POA: Insufficient documentation

## 2016-12-16 ENCOUNTER — Encounter (INDEPENDENT_AMBULATORY_CARE_PROVIDER_SITE_OTHER): Payer: BLUE CROSS/BLUE SHIELD | Admitting: Physical Medicine and Rehabilitation

## 2016-12-20 ENCOUNTER — Ambulatory Visit (INDEPENDENT_AMBULATORY_CARE_PROVIDER_SITE_OTHER): Payer: BLUE CROSS/BLUE SHIELD | Admitting: Orthopaedic Surgery

## 2016-12-20 ENCOUNTER — Encounter (INDEPENDENT_AMBULATORY_CARE_PROVIDER_SITE_OTHER): Payer: Self-pay | Admitting: Orthopaedic Surgery

## 2016-12-20 DIAGNOSIS — G5601 Carpal tunnel syndrome, right upper limb: Secondary | ICD-10-CM

## 2016-12-20 DIAGNOSIS — G5602 Carpal tunnel syndrome, left upper limb: Secondary | ICD-10-CM

## 2016-12-20 NOTE — Progress Notes (Signed)
Office Visit Note   Patient: [name]           Date of Birth: 12/15/1960           MRN: [id number] Visit Date: 12/20/2016              Requested by: Pincus SanesStacy J Burns, MD [address] [place], [place] [id number] PCP: Pincus SanesStacy J Burns, MD   Assessment & Plan: Visit Diagnoses: No diagnosis found.  Plan: Patient had bilateral severe carpal tunnel syndrome with clinical manifestation. At this point I recommended surgical release. We discussed risks benefits alternatives to surgery and he wished to proceed. He'll give us a call. I do want his foot infection to be completely treated before we can proceed with surgery. he understands this and he will give us a call.  Follow-Up Instructions: No Follow-up on file.   Orders:  No orders of the defined types were placed in this encounter.  No orders of the defined types were placed in this encounter.     Procedures: No procedures performed   Clinical Data: No additional findings.   Subjective: Chief Complaint  Patient presents with  . Right Hand - Pain  . Left Hand - Pain    Patient comes back today for review of his nerve conduction studies. He is currently unemployed. His left hand is worse. Nerve conduction studies showed bilateral severe carpal tunnel syndrome. He currently has a blister on his foot. He does have peripheral neuropathy. But he does not have diabetes.    Review of Systems  Constitutional: Negative.   All other systems reviewed and are negative.    Objective: Vital Signs: There were no vitals taken for this visit.  Physical Exam  Constitutional: He is oriented to person, place, and time. He appears well-developed and well-nourished.  Pulmonary/Chest: Effort normal.  Abdominal: Soft.  Neurological: He is alert and oriented to person, place, and time.  Skin: Skin is warm.  Psychiatric: He has a normal mood and affect. His behavior is normal. Judgment and thought content normal.  Nursing note and vitals  reviewed.   Ortho Exam Exam of bilateral hand shows mild thenar flattening Specialty Comments:  No specialty comments available.  Imaging: No results found.   PMFS History: Patient Active Problem List   Diagnosis Date Noted  . Bilateral carpal tunnel syndrome 12/15/2016  . Fatty liver 12/14/2016  . Hyperlipidemia 11/25/2016  . Elevated liver enzymes 11/25/2016  . Prediabetes 11/25/2016  . Hypertension 11/23/2016  . Neuropathy (HCC) 11/23/2016  . Toe amputation status, right (HCC) 11/23/2016  . Morbid obesity (HCC) 11/23/2016  . Numbness and tingling in left hand 11/23/2016  . OSA (obstructive sleep apnea) 11/23/2016   Past Medical History:  Diagnosis Date  . Arthritis   . Diverticulitis    portion of colon removed  . History of ventral hernia repair 2016  . Hypertension     Family History  Problem Relation Age of Onset  . Adopted: Yes  . Family history unknown: Yes    Past Surgical History:  Procedure Laterality Date  . HERNIA REPAIR  2016   ventral  . JOINT REPLACEMENT     b/l knees    Social History   Occupational History  . Not on file.   Social History Main Topics  . Smoking status: Former Games developermoker  . Smokeless tobacco: Never Used  . Alcohol use Yes     Comment: 10 / week  . Drug use: No  . Sexual  activity: Not on file

## 2016-12-21 ENCOUNTER — Encounter: Payer: Self-pay | Admitting: Internal Medicine

## 2016-12-21 ENCOUNTER — Ambulatory Visit (INDEPENDENT_AMBULATORY_CARE_PROVIDER_SITE_OTHER): Payer: BLUE CROSS/BLUE SHIELD | Admitting: Internal Medicine

## 2016-12-21 VITALS — BP 148/88 | HR 78 | Temp 98.0°F | Resp 16 | Wt 302.0 lb

## 2016-12-21 DIAGNOSIS — G5603 Carpal tunnel syndrome, bilateral upper limbs: Secondary | ICD-10-CM

## 2016-12-21 DIAGNOSIS — R7303 Prediabetes: Secondary | ICD-10-CM

## 2016-12-21 DIAGNOSIS — I10 Essential (primary) hypertension: Secondary | ICD-10-CM

## 2016-12-21 DIAGNOSIS — L97519 Non-pressure chronic ulcer of other part of right foot with unspecified severity: Secondary | ICD-10-CM | POA: Diagnosis not present

## 2016-12-21 DIAGNOSIS — E7800 Pure hypercholesterolemia, unspecified: Secondary | ICD-10-CM | POA: Diagnosis not present

## 2016-12-21 DIAGNOSIS — L97429 Non-pressure chronic ulcer of left heel and midfoot with unspecified severity: Secondary | ICD-10-CM | POA: Insufficient documentation

## 2016-12-21 DIAGNOSIS — K760 Fatty (change of) liver, not elsewhere classified: Secondary | ICD-10-CM

## 2016-12-21 DIAGNOSIS — R748 Abnormal levels of other serum enzymes: Secondary | ICD-10-CM

## 2016-12-21 DIAGNOSIS — L97509 Non-pressure chronic ulcer of other part of unspecified foot with unspecified severity: Secondary | ICD-10-CM

## 2016-12-21 DIAGNOSIS — M25571 Pain in right ankle and joints of right foot: Secondary | ICD-10-CM | POA: Diagnosis not present

## 2016-12-21 DIAGNOSIS — L97512 Non-pressure chronic ulcer of other part of right foot with fat layer exposed: Secondary | ICD-10-CM | POA: Diagnosis not present

## 2016-12-21 MED ORDER — METOPROLOL TARTRATE 100 MG PO TABS: 100.0000 mg | ORAL_TABLET | Freq: Two times a day (BID) | ORAL | 3 refills | Status: DC

## 2016-12-21 MED ORDER — VITAMIN D 50 MCG (2000 UT) PO CAPS: 2000.0000 [IU] | ORAL_CAPSULE | Freq: Every day | ORAL | Status: DC

## 2016-12-21 NOTE — Assessment & Plan Note (Signed)
He will have surgery - will see orthopedics

## 2016-12-21 NOTE — Patient Instructions (Addendum)
Start exercise, work on weight loss, change your diet.   Medications reviewed and updated.  Changes include increasing metoprolol to 100 mg twice daily.   Your prescription(s) have been submitted to your pharmacy. Please take as directed and contact our office if you believe you are having problem(s) with the medication(s).   Please followup in 3 months

## 2016-12-21 NOTE — Assessment & Plan Note (Signed)
a1c 6.4% Stressed lifestyle changes, weight loss Follow up in 3 months

## 2016-12-21 NOTE — Assessment & Plan Note (Signed)
Stressed better diet and weight loss Will hold off on statin given elevated LFTs

## 2016-12-21 NOTE — Progress Notes (Signed)
Pre visit review using our clinic review tool, if applicable. No additional management support is needed unless otherwise documented below in the visit note. 

## 2016-12-21 NOTE — Progress Notes (Signed)
Subjective:    Patient ID: [name], male    DOB: 1961-06-21, 56 y.o.   MRN: [id number]  HPI The patient is here for follow up.  Hypertension: He is taking his medication daily. He is not compliant with a low sodium diet.  He denies chest pain, palpitations, shortness of breath and regular headaches. He is not exercising regularly.  He does not monitor his blood pressure at home.    elevated liver enzymes:  An Ultrasound showed fatty liver.  He is taking neutrogenics as a supplement.  He drinks 2-3 drinks a day.  He is not exercising.   Prediabetes:  He is not compliant with a low sugar/carbohydrate diet.  He is not exercising regularly.    Toe ulcer, right foot:  He was in [place] recently and was walking a lot.  He developed a blister on his right first toe and it is now an ulcer.   It looks like it is infected.  He denies pain due to his neuropathy in his legs, which is severe.  He has one of his toes amputated in the past due to infection.  He has an appointment at the foot center today.   Medications and allergies reviewed with patient and updated if appropriate.  Patient Active Problem List   Diagnosis Date Noted  . Bilateral carpal tunnel syndrome 12/15/2016  . Fatty liver 12/14/2016  . Hyperlipidemia 11/25/2016  . Elevated liver enzymes 11/25/2016  . Prediabetes 11/25/2016  . Hypertension 11/23/2016  . Neuropathy (HCC) 11/23/2016  . Toe amputation status, right (HCC) 11/23/2016  . Morbid obesity (HCC) 11/23/2016  . Numbness and tingling in left hand 11/23/2016  . OSA (obstructive sleep apnea) 11/23/2016    Current Outpatient Prescriptions on File Prior to Visit  Medication Sig Dispense Refill  . metoprolol (LOPRESSOR) 50 MG tablet Take 50 mg by mouth 2 (two) times daily.    . naproxen (NAPROSYN) 250 MG tablet Take 250 mg by mouth 2 (two) times daily with a meal.     No current facility-administered medications on file prior to visit.     Past Medical  History:  Diagnosis Date  . Arthritis   . Diverticulitis    portion of colon removed  . History of ventral hernia repair 2016  . Hypertension     Past Surgical History:  Procedure Laterality Date  . HERNIA REPAIR  2016   ventral  . JOINT REPLACEMENT     b/l knees     Social History   Social History  . Marital status: Divorced    Spouse name: N/A  . Number of children: N/A  . Years of education: N/A   Social History Main Topics  . Smoking status: Former Games developer  . Smokeless tobacco: Never Used  . Alcohol use Yes     Comment: 10 / week  . Drug use: No  . Sexual activity: Not Asked   Other Topics Concern  . None   Social History Narrative  . None    Family History  Problem Relation Age of Onset  . Adopted: Yes  . Family history unknown: Yes    Review of Systems  Constitutional: Negative for fever.  HENT: Positive for congestion (allergies) and postnasal drip (allergies).   Respiratory: Positive for cough (in am from allergies). Negative for shortness of breath and wheezing.   Cardiovascular: Positive for leg swelling (RLE). Negative for chest pain and palpitations.  Gastrointestinal: Negative for abdominal pain, blood in stool, constipation,  diarrhea and nausea.       No gerd  Neurological: Negative for light-headedness and headaches.       Objective:   Vitals:   12/21/16 1438  BP: (!) 148/88  Pulse: 78  Resp: 16  Temp: 98 F (36.7 C)   Wt Readings from Last 3 Encounters:  12/21/16 (!) 302 lb (137 kg)  11/23/16 (!) 304 lb (137.9 kg)   Body mass index is 39.84 kg/m.   Physical Exam    Constitutional: Appears well-developed and well-nourished. No distress.  HENT:  Head: Normocephalic and atraumatic.  Neck: Neck supple. No tracheal deviation present. No thyromegaly present.  No cervical lymphadenopathy Cardiovascular: Normal rate, regular rhythm and normal heart sounds.   No murmur heard. No carotid bruit .  1+ RLE edema,  Pulmonary/Chest:  Effort normal and breath sounds normal. No respiratory distress. No has no wheezes. No rales.  Skin: Skin is warm and dry. Not diaphoretic. right foot wrapped - will not look at it since he has an appt today at the foot center Psychiatric: Normal mood and affect. Behavior is normal.      Assessment & Plan:    See Problem List for Assessment and Plan of chronic medical problems.

## 2016-12-21 NOTE — Assessment & Plan Note (Addendum)
Moderately elevated LFTs Stressed weight loss: Exercise , Decrease portions Stop neutrogenics Decreased alcohol intake  Discussed risk of cirrhosis Recheck lfts in 2-4 weeks

## 2016-12-21 NOTE — Assessment & Plan Note (Addendum)
Stop neugenics Work on weight loss Decrease alcohol intake Recheck in 2-3 weeks

## 2016-12-21 NOTE — Assessment & Plan Note (Signed)
Stressed weight loss - discussed at length

## 2016-12-21 NOTE — Assessment & Plan Note (Addendum)
Walked a lot and got a blister No pain related to decreased sensation He has been soaking it twice a day or wrapping it twice a day Bloody discharge, foul smell, likely infected Has appt with foot center today H/o prior toe amputation due to infection

## 2016-12-21 NOTE — Assessment & Plan Note (Signed)
BP elevated today Increase metoprolol to 100 mg twice daily Follow up in 3 months Monitor BP at home

## 2016-12-28 DIAGNOSIS — L97512 Non-pressure chronic ulcer of other part of right foot with fat layer exposed: Secondary | ICD-10-CM | POA: Diagnosis not present

## 2017-02-04 DIAGNOSIS — G5601 Carpal tunnel syndrome, right upper limb: Secondary | ICD-10-CM | POA: Diagnosis not present

## 2017-02-04 DIAGNOSIS — G5602 Carpal tunnel syndrome, left upper limb: Secondary | ICD-10-CM | POA: Diagnosis not present

## 2017-02-18 ENCOUNTER — Ambulatory Visit (INDEPENDENT_AMBULATORY_CARE_PROVIDER_SITE_OTHER): Payer: BLUE CROSS/BLUE SHIELD | Admitting: Orthopaedic Surgery

## 2017-02-18 DIAGNOSIS — G5602 Carpal tunnel syndrome, left upper limb: Secondary | ICD-10-CM

## 2017-02-18 NOTE — Progress Notes (Signed)
Patient is 2 weeks status post left carpal tunnel release. He is doing well. He still has a little bit of pain. He incision is healed pretty much. The sutures were removed. I did want him to put Neosporin on the incision with a Band-Aid daily until the incisions completely healed over. He does endorse some numbness in his thumb and index finger. The pain is significantly improved. I did tell him that because he had severe carpal tunnel syndrome with axonal injury and demyelination he may not get full recovery and the numbness may take months before it resolves or could even be permanent. I will like to check back with him again in about 4 weeks. Follow-up with me at that time.

## 2017-03-18 ENCOUNTER — Ambulatory Visit (INDEPENDENT_AMBULATORY_CARE_PROVIDER_SITE_OTHER): Payer: BLUE CROSS/BLUE SHIELD | Admitting: Orthopaedic Surgery

## 2017-03-23 ENCOUNTER — Ambulatory Visit: Payer: BLUE CROSS/BLUE SHIELD | Admitting: Internal Medicine

## 2017-05-06 DIAGNOSIS — G5601 Carpal tunnel syndrome, right upper limb: Secondary | ICD-10-CM | POA: Diagnosis not present

## 2017-05-17 ENCOUNTER — Ambulatory Visit (INDEPENDENT_AMBULATORY_CARE_PROVIDER_SITE_OTHER): Payer: BLUE CROSS/BLUE SHIELD | Admitting: Orthopaedic Surgery

## 2017-05-17 DIAGNOSIS — G5601 Carpal tunnel syndrome, right upper limb: Secondary | ICD-10-CM

## 2017-05-17 NOTE — Progress Notes (Signed)
[name] is 2 weeks status post right carpal tunnel release. He is doing well. No complaints. Incision is healed. Sutures were removed. Neosporin to the incision until completely healed. No heavy lifting for 2 more weeks. Follow-up in 4 weeks for recheck.

## 2017-06-03 DIAGNOSIS — M2011 Hallux valgus (acquired), right foot: Secondary | ICD-10-CM | POA: Diagnosis not present

## 2017-06-03 DIAGNOSIS — M89371 Hypertrophy of bone, right ankle and foot: Secondary | ICD-10-CM | POA: Diagnosis not present

## 2017-06-03 DIAGNOSIS — L97519 Non-pressure chronic ulcer of other part of right foot with unspecified severity: Secondary | ICD-10-CM | POA: Diagnosis not present

## 2017-06-03 DIAGNOSIS — L97512 Non-pressure chronic ulcer of other part of right foot with fat layer exposed: Secondary | ICD-10-CM | POA: Diagnosis not present

## 2017-06-03 DIAGNOSIS — M24571 Contracture, right ankle: Secondary | ICD-10-CM | POA: Diagnosis not present

## 2017-06-09 DIAGNOSIS — L97512 Non-pressure chronic ulcer of other part of right foot with fat layer exposed: Secondary | ICD-10-CM | POA: Diagnosis not present

## 2017-06-14 ENCOUNTER — Ambulatory Visit (INDEPENDENT_AMBULATORY_CARE_PROVIDER_SITE_OTHER): Payer: BLUE CROSS/BLUE SHIELD | Admitting: Orthopaedic Surgery

## 2017-06-14 ENCOUNTER — Institutional Professional Consult (permissible substitution): Payer: BLUE CROSS/BLUE SHIELD | Admitting: Internal Medicine

## 2017-07-08 DIAGNOSIS — M2011 Hallux valgus (acquired), right foot: Secondary | ICD-10-CM | POA: Diagnosis not present

## 2017-07-08 DIAGNOSIS — L97512 Non-pressure chronic ulcer of other part of right foot with fat layer exposed: Secondary | ICD-10-CM | POA: Diagnosis not present

## 2017-07-25 DIAGNOSIS — Z79899 Other long term (current) drug therapy: Secondary | ICD-10-CM | POA: Diagnosis not present

## 2017-07-25 DIAGNOSIS — E119 Type 2 diabetes mellitus without complications: Secondary | ICD-10-CM | POA: Diagnosis not present

## 2017-07-25 DIAGNOSIS — L97512 Non-pressure chronic ulcer of other part of right foot with fat layer exposed: Secondary | ICD-10-CM | POA: Diagnosis not present

## 2017-08-04 ENCOUNTER — Ambulatory Visit (INDEPENDENT_AMBULATORY_CARE_PROVIDER_SITE_OTHER): Payer: BLUE CROSS/BLUE SHIELD

## 2017-08-04 ENCOUNTER — Ambulatory Visit (INDEPENDENT_AMBULATORY_CARE_PROVIDER_SITE_OTHER): Payer: BLUE CROSS/BLUE SHIELD | Admitting: Orthopaedic Surgery

## 2017-08-04 ENCOUNTER — Encounter (INDEPENDENT_AMBULATORY_CARE_PROVIDER_SITE_OTHER): Payer: Self-pay | Admitting: Orthopaedic Surgery

## 2017-08-04 DIAGNOSIS — M25561 Pain in right knee: Secondary | ICD-10-CM | POA: Diagnosis not present

## 2017-08-04 DIAGNOSIS — G8929 Other chronic pain: Secondary | ICD-10-CM

## 2017-08-04 DIAGNOSIS — M25551 Pain in right hip: Secondary | ICD-10-CM

## 2017-08-04 DIAGNOSIS — Z96651 Presence of right artificial knee joint: Secondary | ICD-10-CM | POA: Diagnosis not present

## 2017-08-04 NOTE — Progress Notes (Signed)
Office Visit Note   Patient: [name]           Date of Birth: 04/19/1961           MRN: [id number] Visit Date: 08/04/2017              Requested by: [name], [name] [address] [place], [place] [address] PCP: [name], [name]   Assessment & Plan: Visit Diagnoses:  1. Chronic pain of right knee   2. Pain in right hip   3. Presence of right artificial knee joint     Plan: We will obtain a bone scan of the right knee to rule out loosening. X-rays look okay from my standpoint. For his right hip he does have end-stage bone-on-bone degenerative joint disease. He would like to try a cortisone injection since he has had relief from a previous one for his left hip in the past. I will see him back in a couple weeks. Total face to face encounter time was greater than 25 minutes and over half of this time was spent in counseling and/or coordination of care.  Follow-Up Instructions: Return in about 2 weeks (around 08/18/2017).   Orders:  Orders Placed This Encounter  Procedures  . XR KNEE 3 VIEW RIGHT  . XR HIP UNILAT W OR W/O PELVIS 2-3 VIEWS RIGHT  . NM Bone Scan 3 Phase Lower Extremity  . Ambulatory referral to Physical Medicine Rehab   No orders of the defined types were placed in this encounter.     Procedures: No procedures performed   Clinical Data: No additional findings.   Subjective: Chief Complaint  Patient presents with  . Right Knee - Pain  . Right Hip - Pain    [name] comes in today with a new complaint of right knee and hip pain. He is status post right total knee replacement. He has had a couple months this is with the knee felt like it buckles. He's also complaining of right groin and thigh pain with radiation into the medial knee. This is worse with activity and worse at night. Denies any numbness and tingling or radicular symptoms.    Review of Systems  Constitutional: Negative.   All other systems reviewed and are  negative.    Objective: Vital Signs: There were no vitals taken for this visit.  Physical Exam  Constitutional: He is oriented to person, place, and time. He appears well-developed and well-nourished.  Pulmonary/Chest: Effort normal.  Abdominal: Soft.  Neurological: He is alert and oriented to person, place, and time.  Skin: Skin is warm.  Psychiatric: He has a normal mood and affect. His behavior is normal. Judgment and thought content normal.  Nursing note and vitals reviewed.   Ortho Exam Right knee exam shows a fully healed surgical scar without any evidence of infection. There is no swelling. There is no joint effusion. Patellar tracking is normal. Collaterals are grossly intact.  Right hip exam shows mild discomfort with rotation of the hip. Positive Stinchfield sign. Lateral hip is nontender. Specialty Comments:  No specialty comments available.  Imaging: Xr Hip Unilat W Or W/o Pelvis 2-3 Views Right  Result Date: 08/04/2017 End-stage degenerative joint disease of bilateral hips  Xr Knee 3 View Right  Result Date: 08/04/2017 Stable bilateral total knee replacements    PMFS History: Patient Active Problem List   Diagnosis Date Noted  . Foot ulcer (HCC) 12/21/2016  . Bilateral carpal tunnel syndrome 12/15/2016  . Fatty  liver 12/14/2016  . Hyperlipidemia 11/25/2016  . Elevated liver enzymes 11/25/2016  . Prediabetes 11/25/2016  . Hypertension 11/23/2016  . Neuropathy 11/23/2016  . Toe amputation status, right (HCC) 11/23/2016  . Morbid obesity (HCC) 11/23/2016  . OSA (obstructive sleep apnea) 11/23/2016   Past Medical History:  Diagnosis Date  . Arthritis   . Diverticulitis    portion of colon removed  . History of ventral hernia repair 2016  . Hypertension     Family History  Problem Relation Age of Onset  . Adopted: Yes  . Family history unknown: Yes    Past Surgical History:  Procedure Laterality Date  . HERNIA REPAIR  2016   ventral  .  JOINT REPLACEMENT     b/l knees    Social History   Occupational History  . Not on file.   Social History Main Topics  . Smoking status: Former Games developer  . Smokeless tobacco: Never Used  . Alcohol use Yes     Comment: 2-3 a night, some night nothing  . Drug use: No  . Sexual activity: Not on file

## 2017-08-05 ENCOUNTER — Telehealth (INDEPENDENT_AMBULATORY_CARE_PROVIDER_SITE_OTHER): Payer: Self-pay | Admitting: Orthopaedic Surgery

## 2017-08-05 NOTE — Telephone Encounter (Signed)
Patient called stating that he has been taking ibuprofen for his hip pain and he was wondering if he could be prescribed something stronger to help with the pain. CB # [phone]

## 2017-08-08 ENCOUNTER — Telehealth (INDEPENDENT_AMBULATORY_CARE_PROVIDER_SITE_OTHER): Payer: Self-pay | Admitting: *Deleted

## 2017-08-08 ENCOUNTER — Ambulatory Visit (INDEPENDENT_AMBULATORY_CARE_PROVIDER_SITE_OTHER): Payer: BLUE CROSS/BLUE SHIELD | Admitting: Orthopaedic Surgery

## 2017-08-08 MED ORDER — DICLOFENAC SODIUM 75 MG PO TBEC: DELAYED_RELEASE_TABLET | ORAL | 0 refills | Status: DC

## 2017-08-08 NOTE — Telephone Encounter (Signed)
Diclofenac 75 mg twice daily as needed #60

## 2017-08-08 NOTE — Telephone Encounter (Signed)
Pt has [facility]t [facility] BONE SCAN on Mon Oct 29 at 11am for injection and 2pm for the scan. I called and left message on pt vm to return my call for appt information.

## 2017-08-08 NOTE — Telephone Encounter (Signed)
Please advise 

## 2017-08-08 NOTE — Telephone Encounter (Signed)
Called patient to let him know Rx sent to [facility].

## 2017-08-08 NOTE — Addendum Note (Signed)
Addended by: Albertina ParrGARCIA, Ailsa Mireles on: 08/08/2017 04:28 PM   Modules accepted: Orders

## 2017-08-09 ENCOUNTER — Telehealth (INDEPENDENT_AMBULATORY_CARE_PROVIDER_SITE_OTHER): Payer: Self-pay | Admitting: *Deleted

## 2017-08-09 NOTE — Telephone Encounter (Signed)
Scheduled for Friday at 1000

## 2017-08-09 NOTE — Telephone Encounter (Signed)
Pt called inquiring about his cortizone injection that was mentioned at last ov, he states that he was under the impression that he was going to be scheduled to have the cortizone injection for his hip done by [facility] as well as having a bone scan (already scheduled). Pt is suppose to be going to a concert this Saturday and its a standing room only and was hoping that he could get the injection before Friday. I informed him that Dr. Alvester MorinNewton is booked out for 2 weeks and that it would be impossible to get scheduled by Friday this week. Pt would like a call back as to what the plans are.

## 2017-08-09 NOTE — Telephone Encounter (Signed)
Ok sure, let's see if [facility] can fit him in.  Thanks.

## 2017-08-09 NOTE — Telephone Encounter (Signed)
thanks

## 2017-08-09 NOTE — Telephone Encounter (Signed)
See message below °

## 2017-08-12 ENCOUNTER — Encounter (INDEPENDENT_AMBULATORY_CARE_PROVIDER_SITE_OTHER): Payer: Self-pay | Admitting: Physical Medicine and Rehabilitation

## 2017-08-12 ENCOUNTER — Ambulatory Visit (INDEPENDENT_AMBULATORY_CARE_PROVIDER_SITE_OTHER): Payer: Self-pay

## 2017-08-12 ENCOUNTER — Ambulatory Visit (INDEPENDENT_AMBULATORY_CARE_PROVIDER_SITE_OTHER): Payer: BLUE CROSS/BLUE SHIELD | Admitting: Physical Medicine and Rehabilitation

## 2017-08-12 DIAGNOSIS — M25551 Pain in right hip: Secondary | ICD-10-CM

## 2017-08-12 DIAGNOSIS — M25552 Pain in left hip: Secondary | ICD-10-CM | POA: Diagnosis not present

## 2017-08-12 MED ORDER — TRIAMCINOLONE ACETONIDE 40 MG/ML IJ SUSP
80.0000 mg | INTRAMUSCULAR | Status: AC | PRN
  Administered 2017-08-12: 80 mg via INTRA_ARTICULAR

## 2017-08-12 MED ORDER — BUPIVACAINE HCL 0.5 % IJ SOLN
3.0000 mL | INTRAMUSCULAR | Status: AC | PRN
  Administered 2017-08-12: 3 mL via INTRA_ARTICULAR

## 2017-08-12 NOTE — Progress Notes (Signed)
[name] - 56 y.o. male MRN [id number]  Date of birth: 07/21/1961  Office Visit Note: Visit Date: 08/12/2017 PCP: [name], MD Referred by: [name], MD  Subjective: Chief Complaint  Patient presents with  . Right Hip - Pain   HPI: [name] is a 56 year old gentleman that I saw in February electrodiagnostic studies of his hands.  He comes in today for diagnostic and hopefully therapeutic right anesthetic hip arthrogram.  Dr. Roda Shutters has been following him of the patient does have significant end-stage arthritis of both hips.  He has right hip and groin pain particularly with walking.    ROS Otherwise per HPI.  Assessment & Plan: Visit Diagnoses:  1. Pain in right hip   2. Pain in left hip     Plan: Findings:  Diagnostic and hopefully therapeutic anesthetic hip arthrogram.  Patient did get relief during the anesthetic phase of the injection.    Meds & Orders: No orders of the defined types were placed in this encounter.   Orders Placed This Encounter  Procedures  . Large Joint Injection/Arthrocentesis  . XR C-ARM NO REPORT    Follow-up: Return in about 2 weeks (around 08/26/2017) for Dr. Roda Shutters.   Procedures: Large Joint Inj Date/Time: 08/12/2017 10:36 AM Performed by: Tyrell Antonio Authorized by: Tyrell Antonio   Consent Given by:  Patient Site marked: the procedure site was marked   Timeout: prior to procedure the correct patient, procedure, and site was verified   Indications:  Pain and diagnostic evaluation Location:  Hip Site:  R hip joint Prep: patient was prepped and draped in usual sterile fashion   Needle Size:  22 G Approach:  Anterior Ultrasound Guidance: No   Fluoroscopic Guidance: No   Arthrogram: Yes   Medications:  80 mg triamcinolone acetonide 40 MG/ML; 3 mL bupivacaine 0.5 % Aspiration Attempted: Yes   Patient tolerance:  Patient tolerated the procedure well with no immediate complications  Arthrogram demonstrated excellent flow  of contrast throughout the joint surface without extravasation or obvious defect.  The patient had relief of symptoms during the anesthetic phase of the injection.     No notes on file   Clinical History: XR HIP UNILAT W OR W/O PELVIS 2-3 VIEWS RIGHT 08/04/2017 11:31 AM [id number]  Read By: Tarry Kos, MD   End-stage degenerative joint disease of bilateral hips Signed By:  Tarry Kos, MD on 08/04/2017 12:01 PM  He reports that he has quit smoking. He has never used smokeless tobacco.   Recent Labs  11/23/16 1118  HGBA1C 6.4    Objective:  VS:  HT:    WT:   BMI:     BP:   HR: bpm  TEMP: ( )  RESP:  Physical Exam  Musculoskeletal:  Patient ambulates without aid.  He does have pain with hip rotation at end ranges.    Ortho Exam Imaging: Xr C-arm No Report  Result Date: 08/12/2017 Please see Notes or Procedures tab for imaging impression.   Past Medical/Family/Surgical/Social History: Medications & Allergies reviewed per EMR Patient Active Problem List   Diagnosis Date Noted  . Foot ulcer (HCC) 12/21/2016  . Bilateral carpal tunnel syndrome 12/15/2016  . Fatty liver 12/14/2016  . Hyperlipidemia 11/25/2016  . Elevated liver enzymes 11/25/2016  . Prediabetes 11/25/2016  . Hypertension 11/23/2016  . Neuropathy 11/23/2016  . Toe amputation status, right (HCC) 11/23/2016  . Morbid obesity (HCC) 11/23/2016  . OSA (obstructive sleep apnea) 11/23/2016  Past Medical History:  Diagnosis Date  . Arthritis   . Diverticulitis    portion of colon removed  . History of ventral hernia repair 2016  . Hypertension    Family History  Problem Relation Age of Onset  . Adopted: Yes  . Family history unknown: Yes   Past Surgical History:  Procedure Laterality Date  . HERNIA REPAIR  2016   ventral  . JOINT REPLACEMENT     b/l knees    Social History   Occupational History  . Not on file.   Social History Main Topics  . Smoking status: Former Games developermoker  .  Smokeless tobacco: Never Used  . Alcohol use Yes     Comment: 2-3 a night, some night nothing  . Drug use: No  . Sexual activity: Not on file

## 2017-08-12 NOTE — Progress Notes (Deleted)
Right hip pain. Pain is worse with walking. Pain is in the groin and upper thigh.

## 2017-08-12 NOTE — Patient Instructions (Signed)

## 2017-08-15 ENCOUNTER — Encounter (HOSPITAL_COMMUNITY): Payer: BLUE CROSS/BLUE SHIELD

## 2017-08-22 ENCOUNTER — Encounter (HOSPITAL_COMMUNITY)
Admission: RE | Admit: 2017-08-22 | Discharge: 2017-08-22 | Disposition: A | Discharge status: Home / Self Care | Payer: BLUE CROSS/BLUE SHIELD | Source: Ambulatory Visit | Attending: Orthopaedic Surgery | Admitting: Orthopaedic Surgery

## 2017-08-22 DIAGNOSIS — G8929 Other chronic pain: Secondary | ICD-10-CM | POA: Diagnosis not present

## 2017-08-22 DIAGNOSIS — M25561 Pain in right knee: Secondary | ICD-10-CM | POA: Insufficient documentation

## 2017-08-22 DIAGNOSIS — Z96651 Presence of right artificial knee joint: Secondary | ICD-10-CM | POA: Diagnosis not present

## 2017-08-22 DIAGNOSIS — R948 Abnormal results of function studies of other organs and systems: Secondary | ICD-10-CM | POA: Diagnosis not present

## 2017-08-22 MED ORDER — TECHNETIUM TC 99M MEDRONATE IV KIT
18.8000 | PACK | Freq: Once | INTRAVENOUS | Status: AC | PRN
  Administered 2017-08-22: 18.8 via INTRAVENOUS

## 2017-08-25 ENCOUNTER — Ambulatory Visit (INDEPENDENT_AMBULATORY_CARE_PROVIDER_SITE_OTHER): Payer: BLUE CROSS/BLUE SHIELD | Admitting: Orthopaedic Surgery

## 2017-08-25 DIAGNOSIS — Z96651 Presence of right artificial knee joint: Secondary | ICD-10-CM | POA: Diagnosis not present

## 2017-08-25 DIAGNOSIS — M1611 Unilateral primary osteoarthritis, right hip: Secondary | ICD-10-CM

## 2017-08-25 NOTE — Progress Notes (Signed)
Office Visit Note   Patient: [name]           Date of Birth: 01/24/1961           MRN: [id number] Visit Date: 08/25/2017              Requested by: Pincus SanesBurns, Stacy J, MD [address] [place], [place] [address] PCP: Pincus SanesBurns, Stacy J, MD   Assessment & Plan: Visit Diagnoses:  1. Presence of right artificial knee joint   2. Primary osteoarthritis of right hip     Plan: We discussed in terms of his hips that he will likely need a hip replacement however he currently has an open wound of his right foot that a podiatrist is treating.  He may get injections every 3-4 months in his right hip if he continues to get relief from them.  In terms of the right knee the bone scan was normal and did not show any evidence of loosening.  I think the clicking is more from the patellar component and the lateral osteophytes that are impinging.  This does not seem to be bothering him too badly is more of an annoyance.  I will see him back as needed.  He will let me know if he wants us to schedule another injection in a couple months.Total face to face encounter time was greater than 25 minutes and over half of this time was spent in counseling and/or coordination of care.   Follow-Up Instructions: Return if symptoms worsen or fail to improve.   Orders:  No orders of the defined types were placed in this encounter.  No orders of the defined types were placed in this encounter.     Procedures: No procedures performed   Clinical Data: No additional findings.   Subjective: No chief complaint on file.   [name] is here to follow-up on his bone scan.  He denies any changes in medical history.  He did have a right hip injection which helped about 60%.  He does have bone-on-bone arthritis of his right hip.    Review of Systems  Constitutional: Negative.   All other systems reviewed and are negative.    Objective: Vital Signs: There were no vitals taken for this visit.  Physical Exam    Constitutional: He is oriented to person, place, and time. He appears well-developed and well-nourished.  Pulmonary/Chest: Effort normal.  Abdominal: Soft.  Neurological: He is alert and oriented to person, place, and time.  Skin: Skin is warm.  Psychiatric: He has a normal mood and affect. His behavior is normal. Judgment and thought content normal.  Nursing note and vitals reviewed.   Ortho Exam Right hip exam is stable.  Right knee exam shows a fully healed surgical scar.  He does have clicking of the patellar component with flexion of the knee.  There is no subluxation of the patella. Specialty Comments:  No specialty comments available.  Imaging: No results found.   PMFS History: Patient Active Problem List   Diagnosis Date Noted  . Foot ulcer (HCC) 12/21/2016  . Bilateral carpal tunnel syndrome 12/15/2016  . Fatty liver 12/14/2016  . Hyperlipidemia 11/25/2016  . Elevated liver enzymes 11/25/2016  . Prediabetes 11/25/2016  . Hypertension 11/23/2016  . Neuropathy 11/23/2016  . Toe amputation status, right (HCC) 11/23/2016  . Morbid obesity (HCC) 11/23/2016  . OSA (obstructive sleep apnea) 11/23/2016   Past Medical History:  Diagnosis Date  . Arthritis   . Diverticulitis  portion of colon removed  . History of ventral hernia repair 2016  . Hypertension     Family History  Adopted: Yes  Family history unknown: Yes    Past Surgical History:  Procedure Laterality Date  . HERNIA REPAIR  2016   ventral  . JOINT REPLACEMENT     b/l knees    Social History   Occupational History  . Not on file  Tobacco Use  . Smoking status: Former Games developermoker  . Smokeless tobacco: Never Used  Substance and Sexual Activity  . Alcohol use: Yes    Comment: 2-3 a night, some night nothing  . Drug use: No  . Sexual activity: Not on file

## 2017-08-31 DIAGNOSIS — L97512 Non-pressure chronic ulcer of other part of right foot with fat layer exposed: Secondary | ICD-10-CM | POA: Diagnosis not present

## 2017-08-31 DIAGNOSIS — T8189XA Other complications of procedures, not elsewhere classified, initial encounter: Secondary | ICD-10-CM | POA: Diagnosis not present

## 2017-09-13 ENCOUNTER — Telehealth (INDEPENDENT_AMBULATORY_CARE_PROVIDER_SITE_OTHER): Payer: Self-pay | Admitting: Orthopaedic Surgery

## 2017-09-13 NOTE — Telephone Encounter (Signed)
Patient called needing Rx refilled (diclofenac) The number to contact patient is [phone]

## 2017-09-14 NOTE — Telephone Encounter (Signed)
Is this okay if so, how many?

## 2017-09-14 NOTE — Telephone Encounter (Signed)
Yes 60 tablets

## 2017-09-15 ENCOUNTER — Other Ambulatory Visit (INDEPENDENT_AMBULATORY_CARE_PROVIDER_SITE_OTHER): Payer: Self-pay

## 2017-09-15 ENCOUNTER — Telehealth (INDEPENDENT_AMBULATORY_CARE_PROVIDER_SITE_OTHER): Payer: Self-pay | Admitting: Orthopaedic Surgery

## 2017-09-15 DIAGNOSIS — M21611 Bunion of right foot: Secondary | ICD-10-CM | POA: Diagnosis not present

## 2017-09-15 DIAGNOSIS — L97512 Non-pressure chronic ulcer of other part of right foot with fat layer exposed: Secondary | ICD-10-CM | POA: Diagnosis not present

## 2017-09-15 MED ORDER — DICLOFENAC SODIUM 75 MG PO TBEC: DELAYED_RELEASE_TABLET | ORAL | 0 refills | Status: DC

## 2017-09-15 NOTE — Telephone Encounter (Signed)
Sent to the pharm 

## 2017-10-05 DIAGNOSIS — L97512 Non-pressure chronic ulcer of other part of right foot with fat layer exposed: Secondary | ICD-10-CM | POA: Diagnosis not present

## 2017-10-21 ENCOUNTER — Telehealth (INDEPENDENT_AMBULATORY_CARE_PROVIDER_SITE_OTHER): Payer: Self-pay | Admitting: Orthopaedic Surgery

## 2017-10-21 NOTE — Telephone Encounter (Signed)
Patient called wanting refill on Diclofenac 75mg  and something for pain. Having problems w/hips and feeling pretty bad.  Please call patient no name given for pain meds.requesting

## 2017-10-24 ENCOUNTER — Other Ambulatory Visit (INDEPENDENT_AMBULATORY_CARE_PROVIDER_SITE_OTHER): Payer: Self-pay

## 2017-10-24 MED ORDER — DICLOFENAC SODIUM 75 MG PO TBEC: DELAYED_RELEASE_TABLET | ORAL | 0 refills | Status: DC

## 2017-10-24 NOTE — Telephone Encounter (Signed)
Diclofenac sent to pharm

## 2017-10-24 NOTE — Telephone Encounter (Signed)
See message below °

## 2017-10-24 NOTE — Telephone Encounter (Signed)
Yes #30

## 2017-10-25 ENCOUNTER — Ambulatory Visit (INDEPENDENT_AMBULATORY_CARE_PROVIDER_SITE_OTHER): Payer: BLUE CROSS/BLUE SHIELD | Admitting: Orthopaedic Surgery

## 2017-10-31 ENCOUNTER — Ambulatory Visit (INDEPENDENT_AMBULATORY_CARE_PROVIDER_SITE_OTHER): Payer: BLUE CROSS/BLUE SHIELD | Admitting: Orthopaedic Surgery

## 2017-10-31 DIAGNOSIS — M1611 Unilateral primary osteoarthritis, right hip: Secondary | ICD-10-CM

## 2017-10-31 DIAGNOSIS — M1612 Unilateral primary osteoarthritis, left hip: Secondary | ICD-10-CM

## 2017-10-31 MED ORDER — HYDROCODONE-ACETAMINOPHEN 5-325 MG PO TABS: 1.0000 | ORAL_TABLET | Freq: Every day | ORAL | 0 refills | Status: DC | PRN

## 2017-10-31 NOTE — Progress Notes (Signed)
   Office Visit Note   Patient: [name]           Date of Birth: 05/21/1961           MRN: [id number] Visit Date: 10/31/2017              Requested by: Pincus SanesBurns, Stacy J, MD [address] [place], [place] [address] PCP: Pincus SanesBurns, Stacy J, MD   Assessment & Plan: Visit Diagnoses:  1. Primary osteoarthritis of right hip   2. Primary osteoarthritis of left hip     Plan: Impression is 57 year old gentleman with end-stage degenerative joint disease bilateral hips.  We will set him up with another cortisone injection.  Small prescription for hydrocodone was prescribed today.  Follow-up as needed.  He states that his foot wound is healing well.  He understands that this would have to be fully healed before we can proceed with total hip replacement.  Follow-Up Instructions: Return if symptoms worsen or fail to improve.   Orders:  No orders of the defined types were placed in this encounter.  Meds ordered this encounter  Medications  . HYDROcodone-acetaminophen (NORCO) 5-325 MG tablet    Sig: Take 1 tablet by mouth daily as needed.    Dispense:  14 tablet    Refill:  0      Procedures: No procedures performed   Clinical Data: No additional findings.   Subjective: No chief complaint on file.   Patient follows up today for bilateral hip osteoarthritis.  He would like another injection.  He is currently not hip replacement.  He is trying to lose some weight first.    Review of Systems   Objective: Vital Signs: There were no vitals taken for this visit.  Physical Exam  Ortho Exam Bilateral hip exam is stable. Specialty Comments:  No specialty comments available.  Imaging: No results found.   PMFS History: Patient Active Problem List   Diagnosis Date Noted  . Foot ulcer (HCC) 12/21/2016  . Bilateral carpal tunnel syndrome 12/15/2016  . Fatty liver 12/14/2016  . Hyperlipidemia 11/25/2016  . Elevated liver enzymes 11/25/2016  . Prediabetes 11/25/2016  .  Hypertension 11/23/2016  . Neuropathy 11/23/2016  . Toe amputation status, right (HCC) 11/23/2016  . Morbid obesity (HCC) 11/23/2016  . OSA (obstructive sleep apnea) 11/23/2016   Past Medical History:  Diagnosis Date  . Arthritis   . Diverticulitis    portion of colon removed  . History of ventral hernia repair 2016  . Hypertension     Family History  Adopted: Yes  Family history unknown: Yes    Past Surgical History:  Procedure Laterality Date  . HERNIA REPAIR  2016   ventral  . JOINT REPLACEMENT     b/l knees    Social History   Occupational History  . Not on file  Tobacco Use  . Smoking status: Former Games developermoker  . Smokeless tobacco: Never Used  Substance and Sexual Activity  . Alcohol use: Yes    Comment: 2-3 a night, some night nothing  . Drug use: No  . Sexual activity: Not on file

## 2017-11-04 ENCOUNTER — Ambulatory Visit (INDEPENDENT_AMBULATORY_CARE_PROVIDER_SITE_OTHER): Payer: BLUE CROSS/BLUE SHIELD | Admitting: Physical Medicine and Rehabilitation

## 2017-11-04 ENCOUNTER — Encounter (INDEPENDENT_AMBULATORY_CARE_PROVIDER_SITE_OTHER): Payer: Self-pay | Admitting: Physical Medicine and Rehabilitation

## 2017-11-04 ENCOUNTER — Ambulatory Visit (INDEPENDENT_AMBULATORY_CARE_PROVIDER_SITE_OTHER): Payer: BLUE CROSS/BLUE SHIELD

## 2017-11-04 DIAGNOSIS — M25551 Pain in right hip: Secondary | ICD-10-CM

## 2017-11-04 NOTE — Progress Notes (Signed)
[name] - 57 y.o. male MRN [id number]  Date of birth: 02/12/1961  Office Visit Note: Visit Date: 11/04/2017 PCP: Pincus SanesBurns, Stacy J, MD Referred by: Pincus SanesBurns, Stacy J, MD  Subjective: Chief Complaint  Patient presents with  . Right Hip - Pain   HPI: [name] is a 57 year old gentleman followed by Dr. Roda ShuttersXu for his bilateral hip osteoarthritis right more than left.  Prior hip injection did give him good relief and so we are going to repeat this today.  He is looking at some point getting this replaced.  He does have painful range of motion of the right hip with pain in the hip and groin.    ROS Otherwise per HPI.  Assessment & Plan: Visit Diagnoses:  1. Pain in right hip     Plan: Findings:  Right hip intra-articular injection with fluoroscopic guidance.  Patient did have relief during the anesthetic phase of the injection.    Meds & Orders: No orders of the defined types were placed in this encounter.   Orders Placed This Encounter  Procedures  . Large Joint Inj: R hip joint  . XR C-ARM NO REPORT    Follow-up: Return if symptoms worsen or fail to improve.   Procedures: Large Joint Inj: R hip joint on 11/04/2017 8:53 AM Indications: diagnostic evaluation and pain Details: 22 G 3.5 in needle, fluoroscopy-guided anterior approach  Arthrogram: No  Medications: 80 mg triamcinolone acetonide 40 MG/ML; 3 mL bupivacaine 0.5 % Outcome: tolerated well, no immediate complications  There was excellent flow of contrast producing a partial arthrogram of the hip. The patient did have relief of symptoms during the anesthetic phase of the injection. Procedure, treatment alternatives, risks and benefits explained, specific risks discussed. Consent was given by the patient. Immediately prior to procedure a time out was called to verify the correct patient, procedure, equipment, support staff and site/side marked as required. Patient was prepped and draped in the usual sterile fashion.      No notes on file   Clinical History: XR HIP UNILAT W OR W/O PELVIS 2-3 VIEWS RIGHT 08/04/2017 11:31 AM [phone]  Read By: Tarry KosXu, Naiping M, MD   End-stage degenerative joint disease of bilateral hips Signed By:  Tarry KosXu, Naiping M, MD on 08/04/2017 12:01 PM  He reports that he has quit smoking. he has never used smokeless tobacco.  Recent Labs    11/23/16 1118  HGBA1C 6.4    Objective:  VS:  HT:    WT:   BMI:     BP:   HR: bpm  TEMP: ( )  RESP:  Physical Exam  Ortho Exam Imaging: No results found.  Past Medical/Family/Surgical/Social History: Medications & Allergies reviewed per EMR Patient Active Problem List   Diagnosis Date Noted  . Foot ulcer (HCC) 12/21/2016  . Bilateral carpal tunnel syndrome 12/15/2016  . Fatty liver 12/14/2016  . Hyperlipidemia 11/25/2016  . Elevated liver enzymes 11/25/2016  . Prediabetes 11/25/2016  . Hypertension 11/23/2016  . Neuropathy 11/23/2016  . Toe amputation status, right (HCC) 11/23/2016  . Morbid obesity (HCC) 11/23/2016  . OSA (obstructive sleep apnea) 11/23/2016   Past Medical History:  Diagnosis Date  . Arthritis   . Diverticulitis    portion of colon removed  . History of ventral hernia repair 2016  . Hypertension    Family History  Adopted: Yes  Family history unknown: Yes   Past Surgical History:  Procedure Laterality Date  . HERNIA REPAIR  2016   ventral  .  JOINT REPLACEMENT     b/l knees    Social History   Occupational History  . Not on file  Tobacco Use  . Smoking status: Former Games developer  . Smokeless tobacco: Never Used  Substance and Sexual Activity  . Alcohol use: Yes    Comment: 2-3 a night, some night nothing  . Drug use: No  . Sexual activity: Not on file

## 2017-11-04 NOTE — Progress Notes (Deleted)
Bilateral hip pain. Right is worse today. Groin pain. Pain is there with "general life." starts hurting with any movement, gets better through the day.

## 2017-11-04 NOTE — Patient Instructions (Signed)

## 2017-11-10 ENCOUNTER — Telehealth (INDEPENDENT_AMBULATORY_CARE_PROVIDER_SITE_OTHER): Payer: Self-pay | Admitting: Orthopaedic Surgery

## 2017-11-10 NOTE — Telephone Encounter (Signed)
Patient called needing Rx refilled (Diclofenac)75mg . Patient also need Rx refilled (Hydrocodone)  The number to contact patient is [phone].

## 2017-11-10 NOTE — Telephone Encounter (Signed)
Refill #30 of diclofenac.  Can't do hydrocodone though.

## 2017-11-10 NOTE — Telephone Encounter (Signed)
Please advise 

## 2017-11-11 ENCOUNTER — Telehealth (INDEPENDENT_AMBULATORY_CARE_PROVIDER_SITE_OTHER): Payer: Self-pay

## 2017-11-11 MED ORDER — DICLOFENAC SODIUM 75 MG PO TBEC: DELAYED_RELEASE_TABLET | ORAL | 0 refills | Status: DC

## 2017-11-11 NOTE — Telephone Encounter (Signed)
See previous message I sent you as well, please advise?

## 2017-11-11 NOTE — Telephone Encounter (Signed)
Patient would like a call concerning why Rx for Hydrocodone could not be refilled.  Cb# is [phone].  Please advise.  Thank you.

## 2017-11-11 NOTE — Telephone Encounter (Signed)
I'm confused.  Didn't I approve the Rx in the previous message?

## 2017-11-11 NOTE — Telephone Encounter (Signed)
Ok #15

## 2017-11-11 NOTE — Telephone Encounter (Signed)
Patient states that the right hip injection did not help at all, pain got worse.  Has injection left hip Monday 11/14/17.  He is to followup with you on 11/15/17.  He just wanted a few pills to get him through.  He asked the reason for denial of refill? He says last Tuesday he could not even walk, just wanted something to get him through the weekend.

## 2017-11-11 NOTE — Telephone Encounter (Signed)
IC patient and advised per Dr Roda ShuttersXu.  I sent Diclofenac to pharm for patient.

## 2017-11-14 ENCOUNTER — Ambulatory Visit (INDEPENDENT_AMBULATORY_CARE_PROVIDER_SITE_OTHER): Payer: BLUE CROSS/BLUE SHIELD

## 2017-11-14 ENCOUNTER — Ambulatory Visit (INDEPENDENT_AMBULATORY_CARE_PROVIDER_SITE_OTHER): Payer: BLUE CROSS/BLUE SHIELD | Admitting: Physical Medicine and Rehabilitation

## 2017-11-14 ENCOUNTER — Encounter (INDEPENDENT_AMBULATORY_CARE_PROVIDER_SITE_OTHER): Payer: Self-pay | Admitting: Physical Medicine and Rehabilitation

## 2017-11-14 DIAGNOSIS — M25551 Pain in right hip: Secondary | ICD-10-CM

## 2017-11-14 DIAGNOSIS — M25552 Pain in left hip: Secondary | ICD-10-CM

## 2017-11-14 DIAGNOSIS — M1612 Unilateral primary osteoarthritis, left hip: Secondary | ICD-10-CM

## 2017-11-14 DIAGNOSIS — M1611 Unilateral primary osteoarthritis, right hip: Secondary | ICD-10-CM

## 2017-11-14 MED ORDER — HYDROCODONE-ACETAMINOPHEN 5-325 MG PO TABS: 1.0000 | ORAL_TABLET | Freq: Every day | ORAL | 0 refills | Status: DC | PRN

## 2017-11-14 NOTE — Progress Notes (Deleted)
Patient is here today for left hip pain. Groin pain. Some pain down leg to knee. States right side has always been worse. Did not have a lot of relief with right side hip injection.

## 2017-11-14 NOTE — Patient Instructions (Signed)

## 2017-11-14 NOTE — Telephone Encounter (Signed)
IC patient and LM that Rx ready at front desk for pickup

## 2017-11-14 NOTE — Progress Notes (Signed)
[name] - 57 y.o. male MRN [id number]  Date of birth: 11-11-1960  Office Visit Note: Visit Date: 11/14/2017 PCP: [name], MD Referred by: [name], MD  Subjective: No chief complaint on file.  HPI: [name] is a 57 year old gentleman that comes in today for planned possible left intra-articular hip injection with fluoroscopic guidance.  He reports that the right sided injection performed on 11/04/2017 helped during the anesthetic phase and may be for a few days and then really has not helped very much at all.  He is very frustrated at this point because of the hip pain.  He reports concurrent ulcer on his right foot which seems not to want to heal.  He is seeing a podiatrist.  He does have a follow up with Dr. Roda Shutters tomorrow.  We discussed at length his condition.  He really is not having that much in the way of left hip pain or if he is it is really mass by his severe pain on the right.  His pain is right hip and groin worse with movement and ambulating.  He still ambulates without a cane.  He denies any radicular complaints or new trauma.    Review of Systems  Musculoskeletal: Positive for joint pain.  All other systems reviewed and are negative.  Otherwise per HPI.  Assessment & Plan: Visit Diagnoses:  1. Pain in left hip   2. Pain in right hip   3. Unilateral primary osteoarthritis, left hip   4. Unilateral primary osteoarthritis, right hip     Plan: Findings:  Patient has bilateral hip osteoarthritis more severe on the right and more pain on the right.  Last hip injection did not seem to help for very long.  Prior injections were more beneficial.  I did review the fluoroscopic images and it does show good flow of contrast into the hip joint.  He did get relief during the anesthetic phase.  He is going to talk to Dr. Roda Shutters tomorrow.  I told him I would discuss his case with Dr. Roda Shutters.  One avenue may be to have Dr. Lajoyce Corners evaluate his foot or Dr. Roda Shutters to evaluate his foot for  this healing ulcer if that is what standing in the way of having hip replacement.  Obviously they will discuss this.  We decided not to complete the left-sided hip injection today.  Depending on their plan we could repeat the right hip injection at some point down the road and just see if he gets better relief as an interim.  Unfortunately is no really other avenue of injection treatment.  Radiofrequency ablation of the hip joint is something that is being done but I have not far taken on the task to do that.  I think the data is somewhat limited.    Meds & Orders: No orders of the defined types were placed in this encounter.   No orders of the defined types were placed in this encounter.   Follow-up: Return for Dr. Roda Shutters as scheduled.   Procedures: No procedures performed  No notes on file   Clinical History: XR HIP UNILAT W OR W/O PELVIS 2-3 VIEWS RIGHT 08/04/2017 11:31 AM [id number]  Read By: Tarry Kos, MD   End-stage degenerative joint disease of bilateral hips Signed By:  Tarry Kos, MD on 08/04/2017 12:01 PM  He reports that he has quit smoking. he has never used smokeless tobacco.  Recent Labs    11/23/16 1118  HGBA1C  6.4    Objective:  VS:  HT:    WT:   BMI:     BP:   HR: bpm  TEMP: ( )  RESP:  Physical Exam  Musculoskeletal:  Patient ambulates without aid with an antalgic gait to the left.  He has significant pain with hip rotation on the right.    Ortho Exam Imaging: No results found.  Past Medical/Family/Surgical/Social History: Medications & Allergies reviewed per EMR Patient Active Problem List   Diagnosis Date Noted  . Foot ulcer (HCC) 12/21/2016  . Bilateral carpal tunnel syndrome 12/15/2016  . Fatty liver 12/14/2016  . Hyperlipidemia 11/25/2016  . Elevated liver enzymes 11/25/2016  . Prediabetes 11/25/2016  . Hypertension 11/23/2016  . Neuropathy 11/23/2016  . Toe amputation status, right (HCC) 11/23/2016  . Morbid obesity (HCC)  11/23/2016  . OSA (obstructive sleep apnea) 11/23/2016   Past Medical History:  Diagnosis Date  . Arthritis   . Diverticulitis    portion of colon removed  . History of ventral hernia repair 2016  . Hypertension    Family History  Adopted: Yes  Family history unknown: Yes   Past Surgical History:  Procedure Laterality Date  . HERNIA REPAIR  2016   ventral  . JOINT REPLACEMENT     b/l knees    Social History   Occupational History  . Not on file  Tobacco Use  . Smoking status: Former Games developermoker  . Smokeless tobacco: Never Used  Substance and Sexual Activity  . Alcohol use: Yes    Comment: 2-3 a night, some night nothing  . Drug use: No  . Sexual activity: Not on file

## 2017-11-15 ENCOUNTER — Telehealth (INDEPENDENT_AMBULATORY_CARE_PROVIDER_SITE_OTHER): Payer: Self-pay | Admitting: Orthopaedic Surgery

## 2017-11-15 ENCOUNTER — Encounter (INDEPENDENT_AMBULATORY_CARE_PROVIDER_SITE_OTHER): Payer: Self-pay | Admitting: Physical Medicine and Rehabilitation

## 2017-11-15 ENCOUNTER — Ambulatory Visit (INDEPENDENT_AMBULATORY_CARE_PROVIDER_SITE_OTHER): Payer: BLUE CROSS/BLUE SHIELD | Admitting: Orthopaedic Surgery

## 2017-11-15 ENCOUNTER — Ambulatory Visit (INDEPENDENT_AMBULATORY_CARE_PROVIDER_SITE_OTHER): Payer: BLUE CROSS/BLUE SHIELD

## 2017-11-15 DIAGNOSIS — M898X5 Other specified disorders of bone, thigh: Secondary | ICD-10-CM | POA: Diagnosis not present

## 2017-11-15 DIAGNOSIS — M1611 Unilateral primary osteoarthritis, right hip: Secondary | ICD-10-CM

## 2017-11-15 MED ORDER — DULOXETINE HCL 20 MG PO CPEP: 20.0000 mg | ORAL_CAPSULE | Freq: Every day | ORAL | 3 refills | Status: DC

## 2017-11-15 MED ORDER — TRIAMCINOLONE ACETONIDE 40 MG/ML IJ SUSP
80.0000 mg | INTRAMUSCULAR | Status: AC | PRN
  Administered 2017-11-04: 80 mg via INTRA_ARTICULAR

## 2017-11-15 MED ORDER — BUPIVACAINE HCL 0.5 % IJ SOLN
3.0000 mL | INTRAMUSCULAR | Status: AC | PRN
  Administered 2017-11-04: 3 mL via INTRA_ARTICULAR

## 2017-11-15 NOTE — Telephone Encounter (Signed)
Ok to do temporary for 2 months

## 2017-11-15 NOTE — Progress Notes (Signed)
Office Visit Note   Patient: [name]           Date of Birth: 08-Sep-1961           MRN: [id number] Visit Date: 11/15/2017              Requested by: [name], MD [address] [place], [place] [address] PCP: [name], MD   Assessment & Plan: Visit Diagnoses:  1. Pain in right femur   2. Primary osteoarthritis of right hip     Plan: Impression is a 57 year old gentleman with advanced degenerative joint disease right hip and dystrophic pain with resultant depression.  I prescribed low-dose Cymbalta to see if this will help with his pain.  Handicap placard was given today.  Patient is scheduled to see Dr. Lajoyce Corners tomorrow regarding his foot ulcer.  He will call Dr. Alvester Morin back when he is ready for another cortisone injection.  Follow-Up Instructions: Return if symptoms worsen or fail to improve.   Orders:  Orders Placed This Encounter  Procedures  . XR FEMUR, MIN 2 VIEWS RIGHT   Meds ordered this encounter  Medications  . DULoxetine (CYMBALTA) 20 MG capsule    Sig: Take 1 capsule (20 mg total) by mouth daily.    Dispense:  30 capsule    Refill:  3      Procedures: No procedures performed   Clinical Data: No additional findings.   Subjective: Chief Complaint  Patient presents with  . Right Leg - Injury, Pain    S/p fall 11/14/2017     Patient comes in today status post fall yesterday.  He has right leg pain.  He is also complaining of severe right hip pain.  He is ambulating with crutches.    Review of Systems  Constitutional: Negative.   All other systems reviewed and are negative.    Objective: Vital Signs: There were no vitals taken for this visit.  Physical Exam  Constitutional: He is oriented to person, place, and time. He appears well-developed and well-nourished.  Pulmonary/Chest: Effort normal.  Abdominal: Soft.  Neurological: He is alert and oriented to person, place, and time.  Skin: Skin is warm.  Psychiatric: He has a normal  mood and affect. His behavior is normal. Judgment and thought content normal.  Nursing note and vitals reviewed.   Ortho Exam Right lower extremity exam shows no focal tenderness.  He has painful range of motion of the hip. Specialty Comments:  No specialty comments available.  Imaging: Xr Femur, Min 2 Views Right  Result Date: 11/15/2017 No acute or structural abnormalities.  Advanced degenerative joint disease right hip    PMFS History: Patient Active Problem List   Diagnosis Date Noted  . Foot ulcer (HCC) 12/21/2016  . Bilateral carpal tunnel syndrome 12/15/2016  . Fatty liver 12/14/2016  . Hyperlipidemia 11/25/2016  . Elevated liver enzymes 11/25/2016  . Prediabetes 11/25/2016  . Hypertension 11/23/2016  . Neuropathy 11/23/2016  . Toe amputation status, right (HCC) 11/23/2016  . Morbid obesity (HCC) 11/23/2016  . OSA (obstructive sleep apnea) 11/23/2016   Past Medical History:  Diagnosis Date  . Arthritis   . Diverticulitis    portion of colon removed  . History of ventral hernia repair 2016  . Hypertension     Family History  Adopted: Yes  Family history unknown: Yes    Past Surgical History:  Procedure Laterality Date  . HERNIA REPAIR  2016   ventral  . JOINT REPLACEMENT  b/l knees    Social History   Occupational History  . Not on file  Tobacco Use  . Smoking status: Former Games developermoker  . Smokeless tobacco: Never Used  Substance and Sexual Activity  . Alcohol use: Yes    Comment: 2-3 a night, some night nothing  . Drug use: No  . Sexual activity: Not on file

## 2017-11-15 NOTE — Telephone Encounter (Signed)
Handicap form ready for pick up here in our office. Called patient to advise . States he has scheduled appt tomorrow with Dr Lajoyce Cornersuda and will pick up then.

## 2017-11-15 NOTE — Telephone Encounter (Signed)
Is this okay?

## 2017-11-15 NOTE — Telephone Encounter (Signed)
Patient called back stating that he is needing a handicap plaque and also he is wanting to know what exercises does he need to do for his hip until he is scheduled for his surgery.  CB#[phone].  Thank you.

## 2017-11-16 ENCOUNTER — Encounter (INDEPENDENT_AMBULATORY_CARE_PROVIDER_SITE_OTHER): Payer: Self-pay | Admitting: Orthopedic Surgery

## 2017-11-16 ENCOUNTER — Ambulatory Visit (INDEPENDENT_AMBULATORY_CARE_PROVIDER_SITE_OTHER): Payer: Self-pay

## 2017-11-16 ENCOUNTER — Ambulatory Visit (INDEPENDENT_AMBULATORY_CARE_PROVIDER_SITE_OTHER): Payer: BLUE CROSS/BLUE SHIELD | Admitting: Family

## 2017-11-16 DIAGNOSIS — E11621 Type 2 diabetes mellitus with foot ulcer: Secondary | ICD-10-CM | POA: Diagnosis not present

## 2017-11-16 DIAGNOSIS — L97413 Non-pressure chronic ulcer of right heel and midfoot with necrosis of muscle: Secondary | ICD-10-CM | POA: Diagnosis not present

## 2017-11-17 NOTE — Progress Notes (Signed)
Office Visit Note   Patient: [name]           Date of Birth: 01/11/1961           MRN: [id number] Visit Date: 11/16/2017              Requested by: Pincus SanesBurns, Stacy J, MD [address] [place], [place] [address] PCP: Pincus SanesBurns, Stacy J, MD  No chief complaint on file.     HPI: The patient is a 57 year old gentleman seen today in referral from Dr. Roda ShuttersXu for evaluation of diabetic foot ulcer on right foot. This has been on going for a couple years. States has been following with Podiatry on [address]. Has been having serial debridements. Last MRI of foot was over a year ago.   Does daily Santyl dressings. Does have offloading shoe. Is not wearing today.   Feels the foot has an odor similar to that of previous osteomyelitis, has had previous toe amputations.  Assessment & Plan: Visit Diagnoses:  1. Diabetic ulcer of right midfoot associated with type 2 diabetes mellitus, with necrosis of muscle (HCC)     Plan: due to chronic presence and nonhealing. of ulceration concerned for osteomyelitis. Radiographs of right foot were inconclusive for osteomyelitis. Will proceed with MRI of right foot. Requested he resume his offloading Darco shoe. Pack the wound open with antibacterial ointment and gauze. Stop the santyl as has no exudative or fibrinous tissue.   Follow-Up Instructions: No Follow-up on file.   Ortho Exam  Patient is alert, oriented, no adenopathy, well-dressed, normal affect, normal respiratory effort. On examination of right foot has ulcer beneath 1st MT head has open ulcer with surrounding maceration and callus. Ulcer is 6 mm in diameter and 8 mm deep. Does not probe to bone. Bleeding granulation. Have pared with 10 blade knife back to viable tissue. No surrounding erythema or odor. No sign of infection.   Imaging: No results found. No images are attached to the encounter.  Labs: Lab Results  Component Value Date   HGBA1C 6.4 11/23/2016     @LABSALLVALUES (HGBA1)@  There is no height or weight on file to calculate BMI.  Orders:  Orders Placed This Encounter  Procedures  . XR Foot 2 Views Right  . MR Foot Right w/o contrast   No orders of the defined types were placed in this encounter.    Procedures: No procedures performed  Clinical Data: No additional findings.  ROS:  All other systems negative, except as noted in the HPI. Review of Systems  Constitutional: Negative for chills and fever.  Skin: Positive for wound. Negative for color change.    Objective: Vital Signs: There were no vitals taken for this visit.  Specialty Comments:  No specialty comments available.  PMFS History: Patient Active Problem List   Diagnosis Date Noted  . Diabetic ulcer of right midfoot associated with type 2 diabetes mellitus, with necrosis of muscle (HCC) 11/16/2017  . Foot ulcer (HCC) 12/21/2016  . Bilateral carpal tunnel syndrome 12/15/2016  . Fatty liver 12/14/2016  . Hyperlipidemia 11/25/2016  . Elevated liver enzymes 11/25/2016  . Prediabetes 11/25/2016  . Hypertension 11/23/2016  . Neuropathy 11/23/2016  . Toe amputation status, right (HCC) 11/23/2016  . Morbid obesity (HCC) 11/23/2016  . OSA (obstructive sleep apnea) 11/23/2016   Past Medical History:  Diagnosis Date  . Arthritis   . Diverticulitis    portion of colon removed  . History of ventral hernia repair 2016  .  Hypertension     Family History  Adopted: Yes  Family history unknown: Yes    Past Surgical History:  Procedure Laterality Date  . HERNIA REPAIR  2016   ventral  . JOINT REPLACEMENT     b/l knees    Social History   Occupational History  . Not on file  Tobacco Use  . Smoking status: Former Games developer  . Smokeless tobacco: Never Used  Substance and Sexual Activity  . Alcohol use: Yes    Comment: 2-3 a night, some night nothing  . Drug use: No  . Sexual activity: Not on file

## 2017-11-24 NOTE — Telephone Encounter (Signed)
Pt called wanting to know what exercises does he need to complete pertaining  to his hip

## 2017-11-24 NOTE — Telephone Encounter (Signed)
Please give him the hip conditioning exercises.  Thanks.

## 2017-11-25 ENCOUNTER — Other Ambulatory Visit (INDEPENDENT_AMBULATORY_CARE_PROVIDER_SITE_OTHER): Payer: Self-pay | Admitting: Family

## 2017-11-25 ENCOUNTER — Telehealth (INDEPENDENT_AMBULATORY_CARE_PROVIDER_SITE_OTHER): Payer: Self-pay | Admitting: Orthopaedic Surgery

## 2017-11-25 MED ORDER — DICLOFENAC SODIUM 75 MG PO TBEC: DELAYED_RELEASE_TABLET | ORAL | 0 refills | Status: DC

## 2017-11-25 NOTE — Telephone Encounter (Signed)
Patient come into the clinic to check on his refill on his Diclofenac.  I did not see a request.  Patient would like a refill on Diclofenac.  Patient uses [facility] on [address]  CB#[phone].

## 2017-11-25 NOTE — Telephone Encounter (Signed)
Xu patient, can you advise on refill?

## 2017-11-25 NOTE — Telephone Encounter (Signed)
Home exercises are ready for pick up at the front desk.  Called patient no answer.  LMOM with details.

## 2017-11-30 ENCOUNTER — Other Ambulatory Visit: Payer: BLUE CROSS/BLUE SHIELD

## 2017-12-02 ENCOUNTER — Telehealth (INDEPENDENT_AMBULATORY_CARE_PROVIDER_SITE_OTHER): Payer: Self-pay | Admitting: Orthopaedic Surgery

## 2017-12-02 NOTE — Telephone Encounter (Signed)
Patient checking status of disability form left for Dr. Roda ShuttersXu. Please advise # [phone]

## 2017-12-05 DIAGNOSIS — L97509 Non-pressure chronic ulcer of other part of unspecified foot with unspecified severity: Secondary | ICD-10-CM | POA: Diagnosis not present

## 2017-12-05 DIAGNOSIS — G589 Mononeuropathy, unspecified: Secondary | ICD-10-CM | POA: Diagnosis not present

## 2017-12-05 DIAGNOSIS — M79671 Pain in right foot: Secondary | ICD-10-CM | POA: Diagnosis not present

## 2017-12-05 NOTE — Telephone Encounter (Signed)
Tried to call  Patient no answer. LMOM to return call.  Form is not ready. Dr Roda ShuttersXu is out of the office and will be back on Thursday but do not guarantee that it will be ready Thursday.  Forms usually have to be sent to [facility] to be filled out.

## 2017-12-05 NOTE — Telephone Encounter (Signed)
We also need to know for what body part are we filling this form out for.

## 2017-12-06 NOTE — Telephone Encounter (Signed)
I called patient again. Advised him idk if Dr Roda ShuttersXu will be able to fill out forms since he has not had Surgery. I advised him he will be back on Thursday and will ask him then.    He also had other questions/concerns.  -Patient will be having surgery in his foot next week. (ulcer in foot) and will possibly delay having Hip SU.  -He would like to get Rf's on Hydrocodone & Cymbalta. -Would like to know if its okay for him to get (R) Hip injection with Dr Alvester MorinNewton prior to foot surgery?

## 2017-12-06 NOTE — Telephone Encounter (Signed)
Ok for right hip injection as long as it's 6 weeks or longer from hip surgery.  We will need to check with xu on the hydrocodone and cymbalta

## 2017-12-07 NOTE — Telephone Encounter (Signed)
He can have refill of both #20 of norco but let him know that this has to be the last narcotic pain med Rx.  He should be able to get it from [facility] after his foot surgery.  Her can have cymbalta #30 with 3 refills.

## 2017-12-08 ENCOUNTER — Telehealth (INDEPENDENT_AMBULATORY_CARE_PROVIDER_SITE_OTHER): Payer: Self-pay

## 2017-12-08 ENCOUNTER — Other Ambulatory Visit (INDEPENDENT_AMBULATORY_CARE_PROVIDER_SITE_OTHER): Payer: Self-pay

## 2017-12-08 MED ORDER — DULOXETINE HCL 20 MG PO CPEP: 20.0000 mg | ORAL_CAPSULE | Freq: Every day | ORAL | 3 refills | Status: DC

## 2017-12-08 MED ORDER — HYDROCODONE-ACETAMINOPHEN 5-325 MG PO TABS: 1.0000 | ORAL_TABLET | Freq: Every day | ORAL | 0 refills | Status: DC | PRN

## 2017-12-08 NOTE — Telephone Encounter (Signed)
Rx and forms ready for pick up at the front desk.

## 2017-12-08 NOTE — Telephone Encounter (Signed)
See below

## 2017-12-08 NOTE — Telephone Encounter (Signed)
Please advise. We have no open appointments. Not sure who is doing his foot surgery.

## 2017-12-08 NOTE — Telephone Encounter (Signed)
Patient would like right hip injection. He is having foot surgery on Monday. Wanted to come in before his SU Date.  Last right hip injection was given 11/04/17 by Dr Alvester MorinNewton. Okay to get another inj? Per Mardella LaymanLindsey its okay to get injection---"Ok for right hip injection as long as it's 6 weeks or longer from hip surgery."

## 2017-12-08 NOTE — Telephone Encounter (Signed)
When would it be appropriate to get injection so that I can let patient know.

## 2017-12-08 NOTE — Telephone Encounter (Signed)
I don't think it is wise to do injection that close to foot surgery

## 2017-12-12 ENCOUNTER — Ambulatory Visit (INDEPENDENT_AMBULATORY_CARE_PROVIDER_SITE_OTHER): Payer: BLUE CROSS/BLUE SHIELD | Admitting: Orthopaedic Surgery

## 2017-12-12 DIAGNOSIS — G4733 Obstructive sleep apnea (adult) (pediatric): Secondary | ICD-10-CM | POA: Diagnosis not present

## 2017-12-12 DIAGNOSIS — I10 Essential (primary) hypertension: Secondary | ICD-10-CM | POA: Diagnosis not present

## 2017-12-12 DIAGNOSIS — M2041 Other hammer toe(s) (acquired), right foot: Secondary | ICD-10-CM | POA: Diagnosis not present

## 2017-12-12 DIAGNOSIS — M79671 Pain in right foot: Secondary | ICD-10-CM | POA: Diagnosis not present

## 2017-12-12 DIAGNOSIS — Z8614 Personal history of Methicillin resistant Staphylococcus aureus infection: Secondary | ICD-10-CM | POA: Diagnosis not present

## 2017-12-12 DIAGNOSIS — L97519 Non-pressure chronic ulcer of other part of right foot with unspecified severity: Secondary | ICD-10-CM | POA: Diagnosis not present

## 2017-12-12 DIAGNOSIS — Z888 Allergy status to other drugs, medicaments and biological substances status: Secondary | ICD-10-CM | POA: Diagnosis not present

## 2017-12-12 DIAGNOSIS — Z89429 Acquired absence of other toe(s), unspecified side: Secondary | ICD-10-CM | POA: Diagnosis not present

## 2017-12-12 DIAGNOSIS — Z96653 Presence of artificial knee joint, bilateral: Secondary | ICD-10-CM | POA: Diagnosis not present

## 2017-12-12 DIAGNOSIS — Q6410 Exstrophy of urinary bladder, unspecified: Secondary | ICD-10-CM | POA: Diagnosis not present

## 2017-12-12 DIAGNOSIS — E669 Obesity, unspecified: Secondary | ICD-10-CM | POA: Diagnosis not present

## 2017-12-12 DIAGNOSIS — Z87891 Personal history of nicotine dependence: Secondary | ICD-10-CM | POA: Diagnosis not present

## 2017-12-12 DIAGNOSIS — G589 Mononeuropathy, unspecified: Secondary | ICD-10-CM | POA: Diagnosis not present

## 2017-12-12 DIAGNOSIS — Z79899 Other long term (current) drug therapy: Secondary | ICD-10-CM | POA: Diagnosis not present

## 2017-12-12 DIAGNOSIS — Z9049 Acquired absence of other specified parts of digestive tract: Secondary | ICD-10-CM | POA: Diagnosis not present

## 2017-12-12 DIAGNOSIS — L97509 Non-pressure chronic ulcer of other part of unspecified foot with unspecified severity: Secondary | ICD-10-CM | POA: Diagnosis not present

## 2017-12-12 DIAGNOSIS — Z6838 Body mass index (BMI) 38.0-38.9, adult: Secondary | ICD-10-CM | POA: Diagnosis not present

## 2017-12-14 ENCOUNTER — Other Ambulatory Visit: Payer: BLUE CROSS/BLUE SHIELD

## 2017-12-23 ENCOUNTER — Other Ambulatory Visit (INDEPENDENT_AMBULATORY_CARE_PROVIDER_SITE_OTHER): Payer: Self-pay | Admitting: Family

## 2017-12-23 DIAGNOSIS — M79671 Pain in right foot: Secondary | ICD-10-CM | POA: Diagnosis not present

## 2017-12-26 NOTE — Telephone Encounter (Signed)
This is a [name] pt.  

## 2017-12-27 ENCOUNTER — Other Ambulatory Visit (INDEPENDENT_AMBULATORY_CARE_PROVIDER_SITE_OTHER): Payer: Self-pay | Admitting: Family

## 2017-12-28 NOTE — Telephone Encounter (Signed)
xu pt 

## 2018-01-04 ENCOUNTER — Telehealth (INDEPENDENT_AMBULATORY_CARE_PROVIDER_SITE_OTHER): Payer: Self-pay | Admitting: Orthopaedic Surgery

## 2018-01-04 NOTE — Telephone Encounter (Signed)
1year

## 2018-01-04 NOTE — Telephone Encounter (Signed)
Handicap parking application pt needs new form

## 2018-01-04 NOTE — Telephone Encounter (Signed)
Please advise. If so, for how long?  

## 2018-01-04 NOTE — Telephone Encounter (Signed)
Called patient to let him know form ready for pick up. No answer LMOM

## 2018-01-17 ENCOUNTER — Ambulatory Visit (INDEPENDENT_AMBULATORY_CARE_PROVIDER_SITE_OTHER): Payer: BLUE CROSS/BLUE SHIELD | Admitting: Orthopaedic Surgery

## 2018-01-17 ENCOUNTER — Ambulatory Visit (INDEPENDENT_AMBULATORY_CARE_PROVIDER_SITE_OTHER): Payer: BLUE CROSS/BLUE SHIELD | Admitting: Physical Medicine and Rehabilitation

## 2018-01-17 DIAGNOSIS — M25551 Pain in right hip: Secondary | ICD-10-CM

## 2018-01-17 MED ORDER — HYDROCODONE-ACETAMINOPHEN 5-325 MG PO TABS: 1.0000 | ORAL_TABLET | Freq: Two times a day (BID) | ORAL | 0 refills | Status: DC | PRN

## 2018-01-17 NOTE — Progress Notes (Signed)
Office Visit Note   Patient: [name]           Date of Birth: [birth year]           MRN: [id number] Visit Date: 01/17/2018              Requested by: [name], MD [address] [place], [place] [address] PCP: [name], MD   Assessment & Plan: Visit Diagnoses:  1. Pain in right hip     Plan: Impression is severe right hip degenerative joint disease in the setting of chronic pain syndrome and depression.  Prescription for Norco was provided today.  I told the patient that I cannot do chronic pain management and therefore we will refer him to a chronic pain management clinic.  I think once he is recovered from his foot surgery and he is ambulating better we can go ahead and schedule hip replacement.  He is having significant difficulties with ADLs and chronic pain related to the right hip.  He is unsafe to ambulate with crutches therefore I gave him a prescription for a Rollator. Total face to face encounter time was greater than 25 minutes and over half of this time was spent in counseling and/or coordination of care.  Follow-Up Instructions: Return if symptoms worsen or fail to improve.   Orders:  Orders Placed This Encounter  Procedures  . Ambulatory referral to Pain Clinic   Meds ordered this encounter  Medications  . HYDROcodone-acetaminophen (NORCO) 5-325 MG tablet    Sig: Take 1-2 tablets by mouth 2 (two) times daily as needed.    Dispense:  30 tablet    Refill:  0      Procedures: No procedures performed   Clinical Data: No additional findings.   Subjective: Chief Complaint  Patient presents with  . Right Hip - Pain    Patient is a 57 year old gentleman comes in with severe right hip pain.  He was scheduled for a right hip injection with Dr. Alvester Morin but he was unable to make this appointment he recently had left foot surgery.  He is having a lot of generalized pain also.  He is also having issues with depression.  He is having a lot of difficulty  with ambulation.  He is falling frequently.   Review of Systems  Constitutional: Negative.   All other systems reviewed and are negative.    Objective: Vital Signs: There were no vitals taken for this visit.  Physical Exam  Constitutional: He is oriented to person, place, and time. He appears well-developed and well-nourished.  Pulmonary/Chest: Effort normal.  Abdominal: Soft.  Neurological: He is alert and oriented to person, place, and time.  Skin: Skin is warm.  Psychiatric: He has a normal mood and affect. His behavior is normal. Judgment and thought content normal.  Nursing note and vitals reviewed.   Ortho Exam Right hip exam is stable. Specialty Comments:  No specialty comments available.  Imaging: No results found.   PMFS History: Patient Active Problem List   Diagnosis Date Noted  . Diabetic ulcer of right midfoot associated with type 2 diabetes mellitus, with necrosis of muscle (HCC) 11/16/2017  . Foot ulcer (HCC) 12/21/2016  . Bilateral carpal tunnel syndrome 12/15/2016  . Fatty liver 12/14/2016  . Hyperlipidemia 11/25/2016  . Elevated liver enzymes 11/25/2016  . Prediabetes 11/25/2016  . Hypertension 11/23/2016  . Neuropathy 11/23/2016  . Toe amputation status, right (HCC) 11/23/2016  . Morbid obesity (HCC) 11/23/2016  .  OSA (obstructive sleep apnea) 11/23/2016   Past Medical History:  Diagnosis Date  . Arthritis   . Diverticulitis    portion of colon removed  . History of ventral hernia repair 2016  . Hypertension     Family History  Adopted: Yes  Family history unknown: Yes    Past Surgical History:  Procedure Laterality Date  . HERNIA REPAIR  2016   ventral  . JOINT REPLACEMENT     b/l knees    Social History   Occupational History  . Not on file  Tobacco Use  . Smoking status: Former Games developermoker  . Smokeless tobacco: Never Used  Substance and Sexual Activity  . Alcohol use: Yes    Comment: 2-3 a night, some night nothing  . Drug  use: No  . Sexual activity: Not on file

## 2018-01-18 ENCOUNTER — Telehealth (INDEPENDENT_AMBULATORY_CARE_PROVIDER_SITE_OTHER): Payer: Self-pay | Admitting: Orthopaedic Surgery

## 2018-01-18 DIAGNOSIS — Z89421 Acquired absence of other right toe(s): Secondary | ICD-10-CM | POA: Diagnosis not present

## 2018-01-18 DIAGNOSIS — Z96653 Presence of artificial knee joint, bilateral: Secondary | ICD-10-CM | POA: Diagnosis not present

## 2018-01-18 DIAGNOSIS — M1991 Primary osteoarthritis, unspecified site: Secondary | ICD-10-CM | POA: Diagnosis not present

## 2018-01-18 DIAGNOSIS — I10 Essential (primary) hypertension: Secondary | ICD-10-CM | POA: Diagnosis not present

## 2018-01-18 DIAGNOSIS — E1140 Type 2 diabetes mellitus with diabetic neuropathy, unspecified: Secondary | ICD-10-CM | POA: Diagnosis not present

## 2018-01-18 DIAGNOSIS — Z4781 Encounter for orthopedic aftercare following surgical amputation: Secondary | ICD-10-CM | POA: Diagnosis not present

## 2018-01-18 DIAGNOSIS — G4733 Obstructive sleep apnea (adult) (pediatric): Secondary | ICD-10-CM | POA: Diagnosis not present

## 2018-01-18 DIAGNOSIS — G5603 Carpal tunnel syndrome, bilateral upper limbs: Secondary | ICD-10-CM | POA: Diagnosis not present

## 2018-01-18 DIAGNOSIS — Z6836 Body mass index (BMI) 36.0-36.9, adult: Secondary | ICD-10-CM | POA: Diagnosis not present

## 2018-01-18 DIAGNOSIS — Z9181 History of falling: Secondary | ICD-10-CM | POA: Diagnosis not present

## 2018-01-18 DIAGNOSIS — Z87891 Personal history of nicotine dependence: Secondary | ICD-10-CM | POA: Diagnosis not present

## 2018-01-18 NOTE — Telephone Encounter (Signed)
Erik-PT/[facility] Requesting PT 2 times a week for 4 weeks. Need  Verbal ok.  Please call him to advise

## 2018-01-19 NOTE — Telephone Encounter (Signed)
Do you have the CB NUMBER?

## 2018-01-19 NOTE — Telephone Encounter (Signed)
Okay on orders. 

## 2018-01-19 NOTE — Telephone Encounter (Signed)
yes

## 2018-01-20 ENCOUNTER — Telehealth (INDEPENDENT_AMBULATORY_CARE_PROVIDER_SITE_OTHER): Payer: Self-pay | Admitting: Orthopaedic Surgery

## 2018-01-20 NOTE — Telephone Encounter (Signed)
yes

## 2018-01-20 NOTE — Telephone Encounter (Signed)
Rolm GalaErik, PT with [facility] called to [facility]  [facility] a week for 4 weeks for PT.  He also needs an OT evaluation.  CB#[phone].  Thank you.

## 2018-01-20 NOTE — Telephone Encounter (Signed)
Is this ok?

## 2018-01-20 NOTE — Telephone Encounter (Signed)
IC verbal given.  

## 2018-01-23 DIAGNOSIS — L97509 Non-pressure chronic ulcer of other part of unspecified foot with unspecified severity: Secondary | ICD-10-CM | POA: Diagnosis not present

## 2018-01-23 DIAGNOSIS — G589 Mononeuropathy, unspecified: Secondary | ICD-10-CM | POA: Diagnosis not present

## 2018-01-23 DIAGNOSIS — L97309 Non-pressure chronic ulcer of unspecified ankle with unspecified severity: Secondary | ICD-10-CM | POA: Diagnosis not present

## 2018-01-23 DIAGNOSIS — Z4789 Encounter for other orthopedic aftercare: Secondary | ICD-10-CM | POA: Diagnosis not present

## 2018-01-26 DIAGNOSIS — G5603 Carpal tunnel syndrome, bilateral upper limbs: Secondary | ICD-10-CM | POA: Diagnosis not present

## 2018-01-26 DIAGNOSIS — Z96653 Presence of artificial knee joint, bilateral: Secondary | ICD-10-CM | POA: Diagnosis not present

## 2018-01-26 DIAGNOSIS — M1991 Primary osteoarthritis, unspecified site: Secondary | ICD-10-CM | POA: Diagnosis not present

## 2018-01-26 DIAGNOSIS — G4733 Obstructive sleep apnea (adult) (pediatric): Secondary | ICD-10-CM | POA: Diagnosis not present

## 2018-01-26 DIAGNOSIS — Z9181 History of falling: Secondary | ICD-10-CM | POA: Diagnosis not present

## 2018-01-26 DIAGNOSIS — I10 Essential (primary) hypertension: Secondary | ICD-10-CM | POA: Diagnosis not present

## 2018-01-26 DIAGNOSIS — Z87891 Personal history of nicotine dependence: Secondary | ICD-10-CM | POA: Diagnosis not present

## 2018-01-26 DIAGNOSIS — Z6836 Body mass index (BMI) 36.0-36.9, adult: Secondary | ICD-10-CM | POA: Diagnosis not present

## 2018-01-26 DIAGNOSIS — E1140 Type 2 diabetes mellitus with diabetic neuropathy, unspecified: Secondary | ICD-10-CM | POA: Diagnosis not present

## 2018-01-26 DIAGNOSIS — Z4781 Encounter for orthopedic aftercare following surgical amputation: Secondary | ICD-10-CM | POA: Diagnosis not present

## 2018-01-26 DIAGNOSIS — Z89421 Acquired absence of other right toe(s): Secondary | ICD-10-CM | POA: Diagnosis not present

## 2018-01-27 DIAGNOSIS — Z96653 Presence of artificial knee joint, bilateral: Secondary | ICD-10-CM | POA: Diagnosis not present

## 2018-01-27 DIAGNOSIS — E1140 Type 2 diabetes mellitus with diabetic neuropathy, unspecified: Secondary | ICD-10-CM | POA: Diagnosis not present

## 2018-01-27 DIAGNOSIS — Z9181 History of falling: Secondary | ICD-10-CM | POA: Diagnosis not present

## 2018-01-27 DIAGNOSIS — G5603 Carpal tunnel syndrome, bilateral upper limbs: Secondary | ICD-10-CM | POA: Diagnosis not present

## 2018-01-27 DIAGNOSIS — Z6836 Body mass index (BMI) 36.0-36.9, adult: Secondary | ICD-10-CM | POA: Diagnosis not present

## 2018-01-27 DIAGNOSIS — G4733 Obstructive sleep apnea (adult) (pediatric): Secondary | ICD-10-CM | POA: Diagnosis not present

## 2018-01-27 DIAGNOSIS — Z87891 Personal history of nicotine dependence: Secondary | ICD-10-CM | POA: Diagnosis not present

## 2018-01-27 DIAGNOSIS — Z89421 Acquired absence of other right toe(s): Secondary | ICD-10-CM | POA: Diagnosis not present

## 2018-01-27 DIAGNOSIS — I10 Essential (primary) hypertension: Secondary | ICD-10-CM | POA: Diagnosis not present

## 2018-01-27 DIAGNOSIS — M1991 Primary osteoarthritis, unspecified site: Secondary | ICD-10-CM | POA: Diagnosis not present

## 2018-01-27 DIAGNOSIS — Z4781 Encounter for orthopedic aftercare following surgical amputation: Secondary | ICD-10-CM | POA: Diagnosis not present

## 2018-01-28 DIAGNOSIS — M1612 Unilateral primary osteoarthritis, left hip: Secondary | ICD-10-CM | POA: Diagnosis not present

## 2018-01-28 DIAGNOSIS — M1611 Unilateral primary osteoarthritis, right hip: Secondary | ICD-10-CM | POA: Diagnosis not present

## 2018-01-30 NOTE — Progress Notes (Signed)
Subjective:    Patient ID: [name], male    DOB: 10-Mar-1961, 57 y.o.   MRN: [id number]  HPI  Medications and allergies reviewed with patient and updated if appropriate.  Patient Active Problem List   Diagnosis Date Noted  . Diabetic ulcer of right midfoot associated with type 2 diabetes mellitus, with necrosis of muscle (HCC) 11/16/2017  . Foot ulcer (HCC) 12/21/2016  . Bilateral carpal tunnel syndrome 12/15/2016  . Fatty liver 12/14/2016  . Hyperlipidemia 11/25/2016  . Elevated liver enzymes 11/25/2016  . Prediabetes 11/25/2016  . Hypertension 11/23/2016  . Neuropathy 11/23/2016  . Toe amputation status, right (HCC) 11/23/2016  . Morbid obesity (HCC) 11/23/2016  . OSA (obstructive sleep apnea) 11/23/2016    Current Outpatient Medications on File Prior to Visit  Medication Sig Dispense Refill  . Cholecalciferol (VITAMIN D) 2000 units CAPS Take 1 capsule (2,000 Units total) by mouth daily. 30 capsule   . diclofenac (VOLTAREN) 75 MG EC tablet 1 TAB PO twice daily as needed 30 tablet 0  . diclofenac (VOLTAREN) 75 MG EC tablet 1 TAB PO twice daily as needed 60 tablet 0  . diclofenac (VOLTAREN) 75 MG EC tablet TAKE 1 TABLET BY MOUTH TWICE A DAY AS NEEDED 60 tablet 0  . diclofenac (VOLTAREN) 75 MG EC tablet TAKE 1 TABLET BY MOUTH TWICE A DAY AS NEEDED 60 tablet 0  . DULoxetine (CYMBALTA) 20 MG capsule Take 1 capsule (20 mg total) by mouth daily. 30 capsule 3  . HYDROcodone-acetaminophen (NORCO) 5-325 MG tablet Take 1 tablet by mouth daily as needed. 20 tablet 0  . HYDROcodone-acetaminophen (NORCO) 5-325 MG tablet Take 1-2 tablets by mouth 2 (two) times daily as needed. 30 tablet 0  . metoprolol (LOPRESSOR) 100 MG tablet Take 1 tablet (100 mg total) by mouth 2 (two) times daily. 180 tablet 3  . naproxen (NAPROSYN) 250 MG tablet Take 250 mg by mouth 2 (two) times daily with a meal.     No current facility-administered medications on file prior to visit.     Past Medical  History:  Diagnosis Date  . Arthritis   . Diverticulitis    portion of colon removed  . History of ventral hernia repair 2016  . Hypertension     Past Surgical History:  Procedure Laterality Date  . HERNIA REPAIR  2016   ventral  . JOINT REPLACEMENT     b/l knees     Social History   Socioeconomic History  . Marital status: Divorced    Spouse name: Not on file  . Number of children: Not on file  . Years of education: Not on file  . Highest education level: Not on file  Occupational History  . Not on file  Social Needs  . Financial resource strain: Not on file  . Food insecurity:    Worry: Not on file    Inability: Not on file  . Transportation needs:    Medical: Not on file    Non-medical: Not on file  Tobacco Use  . Smoking status: Former Games developer  . Smokeless tobacco: Never Used  Substance and Sexual Activity  . Alcohol use: Yes    Comment: 2-3 a night, some night nothing  . Drug use: No  . Sexual activity: Not on file  Lifestyle  . Physical activity:    Days per week: Not on file    Minutes per session: Not on file  . Stress: Not on file  Relationships  .  Social connections:    Talks on phone: Not on file    Gets together: Not on file    Attends religious service: Not on file    Active member of club or organization: Not on file    Attends meetings of clubs or organizations: Not on file    Relationship status: Not on file  Other Topics Concern  . Not on file  Social History Narrative  . Not on file    Family History  Adopted: Yes  Family history unknown: Yes    Review of Systems     Objective:  There were no vitals filed for this visit. BP Readings from Last 3 Encounters:  12/21/16 (!) 148/88  11/23/16 (!) 158/104   Wt Readings from Last 3 Encounters:  12/21/16 (!) 302 lb (137 kg)  11/23/16 (!) 304 lb (137.9 kg)   There is no height or weight on file to calculate BMI.   Physical Exam         Assessment & Plan:    See Problem  List for Assessment and Plan of chronic medical problems.   This encounter was created in error - please disregard.

## 2018-01-31 ENCOUNTER — Encounter: Payer: BLUE CROSS/BLUE SHIELD | Admitting: Internal Medicine

## 2018-01-31 DIAGNOSIS — Z96653 Presence of artificial knee joint, bilateral: Secondary | ICD-10-CM | POA: Diagnosis not present

## 2018-01-31 DIAGNOSIS — Z6836 Body mass index (BMI) 36.0-36.9, adult: Secondary | ICD-10-CM | POA: Diagnosis not present

## 2018-01-31 DIAGNOSIS — Z87891 Personal history of nicotine dependence: Secondary | ICD-10-CM | POA: Diagnosis not present

## 2018-01-31 DIAGNOSIS — Z89421 Acquired absence of other right toe(s): Secondary | ICD-10-CM | POA: Diagnosis not present

## 2018-01-31 DIAGNOSIS — Z4781 Encounter for orthopedic aftercare following surgical amputation: Secondary | ICD-10-CM | POA: Diagnosis not present

## 2018-01-31 DIAGNOSIS — G4733 Obstructive sleep apnea (adult) (pediatric): Secondary | ICD-10-CM | POA: Diagnosis not present

## 2018-01-31 DIAGNOSIS — Z9181 History of falling: Secondary | ICD-10-CM | POA: Diagnosis not present

## 2018-01-31 DIAGNOSIS — E1140 Type 2 diabetes mellitus with diabetic neuropathy, unspecified: Secondary | ICD-10-CM | POA: Diagnosis not present

## 2018-01-31 DIAGNOSIS — G5603 Carpal tunnel syndrome, bilateral upper limbs: Secondary | ICD-10-CM | POA: Diagnosis not present

## 2018-01-31 DIAGNOSIS — I10 Essential (primary) hypertension: Secondary | ICD-10-CM | POA: Diagnosis not present

## 2018-01-31 DIAGNOSIS — M1991 Primary osteoarthritis, unspecified site: Secondary | ICD-10-CM | POA: Diagnosis not present

## 2018-02-01 ENCOUNTER — Encounter: Payer: Self-pay | Admitting: Family

## 2018-02-01 ENCOUNTER — Ambulatory Visit: Payer: BLUE CROSS/BLUE SHIELD | Admitting: Family

## 2018-02-01 ENCOUNTER — Other Ambulatory Visit (INDEPENDENT_AMBULATORY_CARE_PROVIDER_SITE_OTHER): Payer: BLUE CROSS/BLUE SHIELD

## 2018-02-01 ENCOUNTER — Other Ambulatory Visit: Payer: Self-pay | Admitting: Family

## 2018-02-01 VITALS — BP 140/86 | HR 83 | Temp 98.5°F | Ht 73.0 in | Wt 298.0 lb

## 2018-02-01 DIAGNOSIS — Z13220 Encounter for screening for lipoid disorders: Secondary | ICD-10-CM

## 2018-02-01 DIAGNOSIS — Z125 Encounter for screening for malignant neoplasm of prostate: Secondary | ICD-10-CM

## 2018-02-01 DIAGNOSIS — M1611 Unilateral primary osteoarthritis, right hip: Secondary | ICD-10-CM

## 2018-02-01 DIAGNOSIS — R7303 Prediabetes: Secondary | ICD-10-CM

## 2018-02-01 DIAGNOSIS — R899 Unspecified abnormal finding in specimens from other organs, systems and tissues: Secondary | ICD-10-CM

## 2018-02-01 DIAGNOSIS — E559 Vitamin D deficiency, unspecified: Secondary | ICD-10-CM | POA: Diagnosis not present

## 2018-02-01 LAB — COMPREHENSIVE METABOLIC PANEL
ALT: 54 U/L — AB (ref 0–53)
AST: 65 U/L — AB (ref 0–37)
Albumin: 3.7 g/dL (ref 3.5–5.2)
Alkaline Phosphatase: 135 U/L — ABNORMAL HIGH (ref 39–117)
BUN: 11 mg/dL (ref 6–23)
CO2: 24 meq/L (ref 19–32)
CREATININE: 0.66 mg/dL (ref 0.40–1.50)
Calcium: 9.2 mg/dL (ref 8.4–10.5)
Chloride: 102 mEq/L (ref 96–112)
GFR: 132.45 mL/min (ref >60.00)
GLUCOSE: 130 mg/dL — AB (ref 70–99)
Potassium: 4 mEq/L (ref 3.5–5.1)
Sodium: 137 mEq/L (ref 135–145)
Total Bilirubin: 1.2 mg/dL (ref 0.2–1.2)
Total Protein: 6.8 g/dL (ref 6.0–8.3)

## 2018-02-01 LAB — CBC WITH DIFFERENTIAL/PLATELET
BASOS PCT: 0.8 % (ref 0.0–3.0)
Basophils Absolute: 0.1 10*3/uL (ref 0.0–0.1)
EOS PCT: 0.9 % (ref 0.0–5.0)
Eosinophils Absolute: 0.1 10*3/uL (ref 0.0–0.7)
HCT: 39.6 % (ref 39.0–52.0)
HEMOGLOBIN: 13.9 g/dL (ref 13.0–17.0)
Lymphocytes Relative: 17.4 % (ref 12.0–46.0)
Lymphs Abs: 1.1 10*3/uL (ref 0.7–4.0)
MCHC: 35.1 g/dL (ref 30.0–36.0)
MCV: 108.5 fl — AB (ref 78.0–100.0)
Monocytes Absolute: 0.6 10*3/uL (ref 0.1–1.0)
Monocytes Relative: 9.2 % (ref 3.0–12.0)
Neutro Abs: 4.7 10*3/uL (ref 1.4–7.7)
Neutrophils Relative %: 71.7 % (ref 43.0–77.0)
Platelets: 258 10*3/uL (ref 150.0–400.0)
RBC: 3.65 Mil/uL — ABNORMAL LOW (ref 4.22–5.81)
RDW: 15.3 % (ref 11.5–15.5)
WBC: 6.6 10*3/uL (ref 4.0–10.5)

## 2018-02-01 LAB — LIPID PANEL
CHOL/HDL RATIO: 5
Cholesterol: 197 mg/dL (ref 0–200)
HDL: 36.9 mg/dL — AB (ref >39.00)
LDL Cholesterol: 133 mg/dL — ABNORMAL HIGH (ref 0–99)
NonHDL: 160.03
TRIGLYCERIDES: 135 mg/dL (ref 0.0–149.0)
VLDL: 27 mg/dL (ref 0.0–40.0)

## 2018-02-01 LAB — C-REACTIVE PROTEIN: CRP: 1.7 mg/dL (ref 0.5–20.0)

## 2018-02-01 LAB — SEDIMENTATION RATE: Sed Rate: 37 mm/hr — ABNORMAL HIGH (ref 0–20)

## 2018-02-01 LAB — HEMOGLOBIN A1C: Hgb A1c MFr Bld: 5.8 % (ref 4.6–6.5)

## 2018-02-01 LAB — PSA: PSA: 0.21 ng/mL (ref 0.10–4.00)

## 2018-02-01 LAB — VITAMIN D 25 HYDROXY (VIT D DEFICIENCY, FRACTURES): VITD: 13.08 ng/mL — ABNORMAL LOW (ref 30.00–100.00)

## 2018-02-01 MED ORDER — VITAMIN D (ERGOCALCIFEROL) 1.25 MG (50000 UNIT) PO CAPS: 50000.0000 [IU] | ORAL_CAPSULE | ORAL | 0 refills | Status: DC

## 2018-02-01 NOTE — Progress Notes (Signed)
[name] is a 57 y.o. male with the following history as recorded in EpicCare:  Patient Active Problem List   Diagnosis Date Noted  . Diabetic ulcer of right midfoot associated with type 2 diabetes mellitus, with necrosis of muscle ([place]) 11/16/2017  . Foot ulcer ([place]) 12/21/2016  . Bilateral carpal tunnel syndrome 12/15/2016  . Fatty liver 12/14/2016  . Hyperlipidemia 11/25/2016  . Elevated liver enzymes 11/25/2016  . Prediabetes 11/25/2016  . Hypertension 11/23/2016  . Neuropathy 11/23/2016  . Toe amputation status, right ([place]) 11/23/2016  . Morbid obesity ([place]) 11/23/2016  . OSA (obstructive sleep apnea) 11/23/2016    Current Outpatient Medications  Medication Sig Dispense Refill  . DULoxetine (CYMBALTA) 20 MG capsule Take 1 capsule (20 mg total) by mouth daily. 30 capsule 3  . HYDROcodone-acetaminophen (NORCO) 10-325 MG tablet TAKE 1 TO 2 TABLETS BY MOUTH TWICE A DAY AS NEEDED  0  . metoprolol (LOPRESSOR) 100 MG tablet Take 1 tablet (100 mg total) by mouth 2 (two) times daily. 180 tablet 3  . naproxen (NAPROSYN) 250 MG tablet Take 250 mg by mouth 2 (two) times daily with a meal.     No current facility-administered medications for this visit.     Allergies: Claritin [loratadine]  Past Medical History:  Diagnosis Date  . Arthritis   . Diverticulitis    portion of colon removed  . History of ventral hernia repair 2016  . Hypertension     Past Surgical History:  Procedure Laterality Date  . HERNIA REPAIR  2016   ventral  . JOINT REPLACEMENT     b/l knees     Family History  Adopted: Yes  Family history unknown: Yes    Social History   Tobacco Use  . Smoking status: Former Research scientist (life sciences)  . Smokeless tobacco: Never Used  Substance Use Topics  . Alcohol use: Yes    Comment: 2-3 a night, some night nothing    Subjective:  Patient is requesting labs to get clearance for upcoming right hip replacement; orthopedist is requesting evaluation of his blood sugar/ last  Hgba1c before surgery can be scheduled; overdue for yearly CPE with PCP- will get that re-scheduled soon; notes that pain in right hip is very severe- just ready to get his surgery scheduled; Denies any chest pain, shortness of breath, blurred vision or headache.     Objective:  Vitals:   02/01/18 0818  BP: 140/86  Pulse: 83  Temp: 98.5 F (36.9 C)  TempSrc: Oral  SpO2: 95%  Weight: 298 lb (135.2 kg)  Height: 6' 1" (1.854 m)    General: Well developed, well nourished, in no acute distress  Skin : Warm and dry.  Head: Normocephalic and atraumatic  Lungs: Respirations unlabored; clear to auscultation bilaterally without wheeze, rales, rhonchi  CVS exam: normal rate and regular rhythm.  Neurologic: Alert and oriented; speech intact; face symmetrical; in wheelchair; CNII-XII intact without focal deficit  Assessment:  1. Primary osteoarthritis of right hip   2. Pre-diabetes   3. Vitamin D deficiency   4. Lipid screening   5. Prostate cancer screening     Plan:  Labs updated as requested; will forward to Dr. Gladstone Lighter at [facility] at [phone]; schedule yearly follow-up with his PCP as well.   No follow-ups on file.  Orders Placed This Encounter  Procedures  . CBC w/Diff    Standing Status:   Future    Standing Expiration Date:   02/01/2019  . Sed Rate (ESR)  Standing Status:   Future    Standing Expiration Date:   02/01/2019  . C-reactive protein    Standing Status:   Future    Standing Expiration Date:   02/01/2019  . Comp Met (CMET)    Standing Status:   Future    Standing Expiration Date:   02/01/2019  . HgB A1c    Standing Status:   Future    Standing Expiration Date:   02/01/2019  . Vitamin D (25 hydroxy)    Standing Status:   Future    Standing Expiration Date:   02/01/2019  . Lipid panel    Standing Status:   Future    Standing Expiration Date:   02/02/2019  . PSA    Standing Status:   Future    Standing Expiration Date:   02/01/2019    Requested  Prescriptions    No prescriptions requested or ordered in this encounter

## 2018-02-04 DIAGNOSIS — M25551 Pain in right hip: Secondary | ICD-10-CM | POA: Diagnosis not present

## 2018-02-10 DIAGNOSIS — M1612 Unilateral primary osteoarthritis, left hip: Secondary | ICD-10-CM | POA: Diagnosis not present

## 2018-02-13 DIAGNOSIS — M25551 Pain in right hip: Secondary | ICD-10-CM | POA: Diagnosis not present

## 2018-02-13 DIAGNOSIS — L97528 Non-pressure chronic ulcer of other part of left foot with other specified severity: Secondary | ICD-10-CM | POA: Diagnosis not present

## 2018-02-13 DIAGNOSIS — M1611 Unilateral primary osteoarthritis, right hip: Secondary | ICD-10-CM | POA: Diagnosis not present

## 2018-02-16 ENCOUNTER — Other Ambulatory Visit: Payer: Self-pay

## 2018-02-16 ENCOUNTER — Inpatient Hospital Stay (HOSPITAL_COMMUNITY)
Admission: EM | Admit: 2018-02-16 | Discharge: 2018-02-20 | DRG: 920 | Disposition: A | Discharge status: Home / Self Care | Payer: BLUE CROSS/BLUE SHIELD | Attending: Internal Medicine | Admitting: Internal Medicine

## 2018-02-16 ENCOUNTER — Emergency Department (HOSPITAL_COMMUNITY): Payer: BLUE CROSS/BLUE SHIELD

## 2018-02-16 ENCOUNTER — Encounter (HOSPITAL_COMMUNITY): Payer: Self-pay

## 2018-02-16 DIAGNOSIS — Z79899 Other long term (current) drug therapy: Secondary | ICD-10-CM

## 2018-02-16 DIAGNOSIS — L03119 Cellulitis of unspecified part of limb: Secondary | ICD-10-CM

## 2018-02-16 DIAGNOSIS — R197 Diarrhea, unspecified: Secondary | ICD-10-CM | POA: Diagnosis present

## 2018-02-16 DIAGNOSIS — M19071 Primary osteoarthritis, right ankle and foot: Secondary | ICD-10-CM | POA: Diagnosis present

## 2018-02-16 DIAGNOSIS — S99921A Unspecified injury of right foot, initial encounter: Secondary | ICD-10-CM | POA: Diagnosis not present

## 2018-02-16 DIAGNOSIS — L089 Local infection of the skin and subcutaneous tissue, unspecified: Secondary | ICD-10-CM | POA: Diagnosis not present

## 2018-02-16 DIAGNOSIS — L97529 Non-pressure chronic ulcer of other part of left foot with unspecified severity: Secondary | ICD-10-CM | POA: Diagnosis not present

## 2018-02-16 DIAGNOSIS — R7303 Prediabetes: Secondary | ICD-10-CM | POA: Diagnosis present

## 2018-02-16 DIAGNOSIS — T8131XA Disruption of external operation (surgical) wound, not elsewhere classified, initial encounter: Principal | ICD-10-CM | POA: Diagnosis present

## 2018-02-16 DIAGNOSIS — E872 Acidosis, unspecified: Secondary | ICD-10-CM

## 2018-02-16 DIAGNOSIS — Z87891 Personal history of nicotine dependence: Secondary | ICD-10-CM

## 2018-02-16 DIAGNOSIS — M87851 Other osteonecrosis, right femur: Secondary | ICD-10-CM | POA: Diagnosis present

## 2018-02-16 DIAGNOSIS — L03116 Cellulitis of left lower limb: Secondary | ICD-10-CM | POA: Diagnosis not present

## 2018-02-16 DIAGNOSIS — Z89421 Acquired absence of other right toe(s): Secondary | ICD-10-CM | POA: Diagnosis not present

## 2018-02-16 DIAGNOSIS — E11628 Type 2 diabetes mellitus with other skin complications: Secondary | ICD-10-CM | POA: Diagnosis present

## 2018-02-16 DIAGNOSIS — M659 Synovitis and tenosynovitis, unspecified: Secondary | ICD-10-CM | POA: Diagnosis not present

## 2018-02-16 DIAGNOSIS — Z96653 Presence of artificial knee joint, bilateral: Secondary | ICD-10-CM | POA: Diagnosis present

## 2018-02-16 DIAGNOSIS — G4733 Obstructive sleep apnea (adult) (pediatric): Secondary | ICD-10-CM | POA: Diagnosis present

## 2018-02-16 DIAGNOSIS — R740 Nonspecific elevation of levels of transaminase and lactic acid dehydrogenase [LDH]: Secondary | ICD-10-CM | POA: Diagnosis present

## 2018-02-16 DIAGNOSIS — L303 Infective dermatitis: Secondary | ICD-10-CM | POA: Diagnosis present

## 2018-02-16 DIAGNOSIS — I10 Essential (primary) hypertension: Secondary | ICD-10-CM | POA: Diagnosis present

## 2018-02-16 DIAGNOSIS — E119 Type 2 diabetes mellitus without complications: Secondary | ICD-10-CM

## 2018-02-16 DIAGNOSIS — W06XXXA Fall from bed, initial encounter: Secondary | ICD-10-CM | POA: Diagnosis present

## 2018-02-16 DIAGNOSIS — S91114A Laceration without foreign body of right lesser toe(s) without damage to nail, initial encounter: Secondary | ICD-10-CM | POA: Diagnosis not present

## 2018-02-16 DIAGNOSIS — I878 Other specified disorders of veins: Secondary | ICD-10-CM | POA: Diagnosis present

## 2018-02-16 DIAGNOSIS — E11621 Type 2 diabetes mellitus with foot ulcer: Secondary | ICD-10-CM | POA: Diagnosis not present

## 2018-02-16 DIAGNOSIS — Z6841 Body Mass Index (BMI) 40.0 and over, adult: Secondary | ICD-10-CM | POA: Diagnosis not present

## 2018-02-16 DIAGNOSIS — F1029 Alcohol dependence with unspecified alcohol-induced disorder: Secondary | ICD-10-CM | POA: Diagnosis not present

## 2018-02-16 DIAGNOSIS — F1020 Alcohol dependence, uncomplicated: Secondary | ICD-10-CM | POA: Diagnosis not present

## 2018-02-16 DIAGNOSIS — F1210 Cannabis abuse, uncomplicated: Secondary | ICD-10-CM | POA: Diagnosis not present

## 2018-02-16 DIAGNOSIS — E1142 Type 2 diabetes mellitus with diabetic polyneuropathy: Secondary | ICD-10-CM | POA: Diagnosis present

## 2018-02-16 DIAGNOSIS — L03031 Cellulitis of right toe: Secondary | ICD-10-CM | POA: Diagnosis present

## 2018-02-16 DIAGNOSIS — L03115 Cellulitis of right lower limb: Secondary | ICD-10-CM | POA: Diagnosis not present

## 2018-02-16 DIAGNOSIS — S91301A Unspecified open wound, right foot, initial encounter: Secondary | ICD-10-CM | POA: Diagnosis not present

## 2018-02-16 DIAGNOSIS — L0390 Cellulitis, unspecified: Secondary | ICD-10-CM | POA: Diagnosis not present

## 2018-02-16 DIAGNOSIS — R600 Localized edema: Secondary | ICD-10-CM | POA: Diagnosis not present

## 2018-02-16 HISTORY — DX: Pain in unspecified hip: M25.559

## 2018-02-16 LAB — CBC WITH DIFFERENTIAL/PLATELET
BASOS PCT: 0 %
Basophils Absolute: 0 10*3/uL (ref 0.0–0.1)
Eosinophils Absolute: 0 10*3/uL (ref 0.0–0.7)
Eosinophils Relative: 0 %
HEMATOCRIT: 37.4 % — AB (ref 39.0–52.0)
HEMOGLOBIN: 13.1 g/dL (ref 13.0–17.0)
LYMPHS PCT: 21 %
Lymphs Abs: 1.3 10*3/uL (ref 0.7–4.0)
MCH: 37.4 pg — ABNORMAL HIGH (ref 26.0–34.0)
MCHC: 35 g/dL (ref 30.0–36.0)
MCV: 106.9 fL — AB (ref 78.0–100.0)
MONO ABS: 0.6 10*3/uL (ref 0.1–1.0)
MONOS PCT: 10 %
NEUTROS ABS: 4.2 10*3/uL (ref 1.7–7.7)
NEUTROS PCT: 69 %
Platelets: 241 10*3/uL (ref 150–400)
RBC: 3.5 MIL/uL — ABNORMAL LOW (ref 4.22–5.81)
RDW: 14.1 % (ref 11.5–15.5)
WBC: 6.1 10*3/uL (ref 4.0–10.5)

## 2018-02-16 LAB — COMPREHENSIVE METABOLIC PANEL
ALBUMIN: 3.4 g/dL — AB (ref 3.5–5.0)
ALK PHOS: 127 U/L — AB (ref 38–126)
ALT: 52 U/L (ref 17–63)
AST: 66 U/L — ABNORMAL HIGH (ref 15–41)
Anion gap: 13 (ref 5–15)
BILIRUBIN TOTAL: 0.8 mg/dL (ref 0.3–1.2)
BUN: 7 mg/dL (ref 6–20)
CALCIUM: 9.8 mg/dL (ref 8.9–10.3)
CO2: 23 mmol/L (ref 22–32)
Chloride: 104 mmol/L (ref 101–111)
Creatinine, Ser: 0.65 mg/dL (ref 0.61–1.24)
GFR calc Af Amer: >60 mL/min (ref >60)
GLUCOSE: 127 mg/dL — AB (ref 65–99)
POTASSIUM: 4.1 mmol/L (ref 3.5–5.1)
Sodium: 140 mmol/L (ref 135–145)
TOTAL PROTEIN: 7.5 g/dL (ref 6.5–8.1)

## 2018-02-16 LAB — I-STAT CG4 LACTIC ACID, ED
LACTIC ACID, VENOUS: 3.44 mmol/L — AB (ref 0.5–1.9)
Lactic Acid, Venous: 2.11 mmol/L (ref 0.5–1.9)

## 2018-02-16 MED ORDER — SODIUM CHLORIDE 0.9 % IV BOLUS
1000.0000 mL | Freq: Once | INTRAVENOUS | Status: AC
  Administered 2018-02-16: 1000 mL via INTRAVENOUS

## 2018-02-16 MED ORDER — VANCOMYCIN HCL 10 G IV SOLR
2250.0000 mg | Freq: Once | INTRAVENOUS | Status: AC
  Administered 2018-02-16: 2250 mg via INTRAVENOUS
  Filled 2018-02-16: qty 2250

## 2018-02-16 MED ORDER — LIDOCAINE HCL (PF) 1 % IJ SOLN
5.0000 mL | Freq: Once | INTRAMUSCULAR | Status: AC
  Administered 2018-02-16: 5 mL via INTRADERMAL
  Filled 2018-02-16: qty 30

## 2018-02-16 MED ORDER — OXYCODONE HCL 5 MG PO TABS
10.0000 mg | ORAL_TABLET | Freq: Once | ORAL | Status: AC
  Administered 2018-02-16: 10 mg via ORAL
  Filled 2018-02-16: qty 2

## 2018-02-16 MED ORDER — PIPERACILLIN-TAZOBACTAM 3.375 G IVPB 30 MIN
3.3750 g | Freq: Once | INTRAVENOUS | Status: AC
  Administered 2018-02-16: 3.375 g via INTRAVENOUS
  Filled 2018-02-16: qty 50

## 2018-02-16 NOTE — ED Triage Notes (Signed)
Pt reports that he had surgery on a R sided hammertoe about a month ago. Today he fell out of bed and now the wound has reopened and is bleeding. He also reports a fall yesterday. He denies head injury or LOC with either. He reports chronic bilateral hip pain, but states that the neuropathy in his feet has decreased the pain there. A&Ox4. Ambulatory with maximum assistance.

## 2018-02-16 NOTE — ED Notes (Signed)
Patient transported to X-ray 

## 2018-02-16 NOTE — ED Notes (Signed)
Notified EDP,Schlossman,MD. Pt. I-stat CG$ Lactic acid results 2.11 and RN,Jake made aware.

## 2018-02-16 NOTE — H&P (Addendum)
History and Physical    [name] ZOX:[id number] DOB: 01/11/1961 DOA: 02/16/2018  PCP: [name], MD Consultants:  Darrelyn Hillock - orthopedics Patient coming from:  Home - lives with girlfriend; NOK: girlfriend, [phone]  Chief Complaint:  Foot wound  HPI: [name] is a 57 y.o. male with medical history significant of HTN, diverticulitis, DM, and OA presenting with wound dehiscence from remote foot surgery.   Patient was sitting on the edge of his bed after not sleeping the night prior.  He fell asleep and hit the floor and his toe started bleeding.  This happened about  1240 pm today.  He didn't sleep because of OSA not on CPAP and bad hip pain.  His B LE are swollen and red for about 3-4 days.  No fevers, + chills.  No sick contacts.  He has been going to Dr. Roda Shutters for his feet and then changed back to Dr. Frutoso Schatz with [facility] in [place] - he did the last operation, right foot second mallet toe correction and lateral sesamoidectomy on 2/28.  It has been healing well until now.  They also removed on ulcer on the ball of his foot and this has healed really nicely.  He had his third toe amputated in the past as well.  He was seen in clinic by Dr. Veda Canning earlier this week.  He has avascular necrosis of the right hip and a left diabetic foot ulcer.  He will need right THR but the left foot wound has to be healed completely first.  He was referred to wound care.   ED Course:  Concern for sepsis secondary to cellulitis.  Larey Seat out of bed, laceration to foot.  Frequent falls, generalized weakness.  Increasing redness of B LE x 3 days with increasing swelling with chills.  Afebrile.  Lactate 3.4, diabetic foot infection protocol.    Review of Systems: As per HPI; otherwise review of systems reviewed and negative.   Ambulatory Status:  Ambulates with a walker  Past Medical History:  Diagnosis Date  . Arthritis   . Diabetes (HCC)    recently diagnosed, "borderline"  . Diverticulitis      portion of colon removed  . Hip pain   . History of ventral hernia repair 2016  . Hypertension     Past Surgical History:  Procedure Laterality Date  . HERNIA REPAIR  2016   ventral  . JOINT REPLACEMENT     b/l knees     Social History   Socioeconomic History  . Marital status: Divorced    Spouse name: Not on file  . Number of children: Not on file  . Years of education: Not on file  . Highest education level: Not on file  Occupational History  . Occupation: unemployed, Press photographer for disability  Social Needs  . Financial resource strain: Not on file  . Food insecurity:    Worry: Not on file    Inability: Not on file  . Transportation needs:    Medical: Not on file    Non-medical: Not on file  Tobacco Use  . Smoking status: Former Games developer  . Smokeless tobacco: Never Used  Substance and Sexual Activity  . Alcohol use: Yes    Comment: 2-3 a night,  the most ever 6+ in a night - he is down from 1/2 gallon every 2 days, now 1/2 gallon every 4 days  . Drug use: Yes    Frequency: 3.0 times per week    Types: Marijuana  .  Sexual activity: Not on file  Lifestyle  . Physical activity:    Days per week: Not on file    Minutes per session: Not on file  . Stress: Not on file  Relationships  . Social connections:    Talks on phone: Not on file    Gets together: Not on file    Attends religious service: Not on file    Active member of club or organization: Not on file    Attends meetings of clubs or organizations: Not on file    Relationship status: Not on file  . Intimate partner violence:    Fear of current or ex partner: Not on file    Emotionally abused: Not on file    Physically abused: Not on file    Forced sexual activity: Not on file  Other Topics Concern  . Not on file  Social History Narrative  . Not on file    Allergies  Allergen Reactions  . Claritin [Loratadine]     SOB    Family History  Adopted: Yes  Family history unknown: Yes    Prior to  Admission medications   Medication Sig Start Date End Date Taking? Authorizing Provider  Docusate Sodium (STOOL SOFTENER) 100 MG capsule Take 100 mg by mouth 2 (two) times daily as needed for constipation.   Yes [provider]  DULoxetine (CYMBALTA) 20 MG capsule Take 1 capsule (20 mg total) by mouth daily. 12/08/17  Yes Tarry Kos, MD  HYDROcodone-acetaminophen (NORCO) 10-325 MG tablet TAKE 1 TABLET BY MOUTH TWICE A DAY AS NEEDED FOR PAIN 01/17/18  Yes [provider]  metoprolol (LOPRESSOR) 100 MG tablet Take 1 tablet (100 mg total) by mouth 2 (two) times daily. 12/21/16  Yes Burns, Bobette Mo, MD  naproxen (NAPROSYN) 250 MG tablet Take 250 mg by mouth 2 (two) times daily with a meal.   Yes [provider]  Vitamin D, Ergocalciferol, (DRISDOL) 50000 units CAPS capsule Take 1 capsule (50,000 Units total) by mouth every 7 (seven) days for 12 doses. Patient not taking: Reported on 02/16/2018 02/01/18 04/20/18  Olive Bass, FNP    Physical Exam: Vitals:   02/16/18 1817 02/16/18 2028 02/16/18 2100 02/16/18 2200  BP: (!) 156/83 (!) 156/66 (!) 147/65 (!) 152/79  Pulse: (!) 50 79 81 81  Resp: 20 (!) 22  (!) 21  Temp:  98.1 F (36.7 C)    TempSrc:  Oral    SpO2: 91% 96% 97% 93%     General:  Appears calm and comfortable and is NAD; somewhat disheveled Eyes:  PERRL, EOMI, normal lids, iris ENT:  grossly normal hearing, lips & tongue, mmm Neck:  no LAD, masses or thyromegaly Cardiovascular:  RRR, no m/r/g. No LE edema.  Respiratory:   CTA bilaterally with no wheezes/rales/rhonchi.  Normal respiratory effort. Abdomen:  soft, NT, ND, NABS Skin: He has chronic skin changes consistent with venous stasis with dermatitis of the LE.  He does not have clear evidence of cellulitis although his B feet are quite erythematous (it is just unusual to have B LE cellulitis).  He does have a left distal 3rd toe ulceration as well as apparent fungal changes on his primarily right  great toe.  There is a laceration on the undersurface of his right 2nd toe - his toe is not actively bleeding but it also does not appear to be sutured closed in this difficult position.          Musculoskeletal:  grossly normal tone BUE/BLE, good ROM, no bony abnormality Psychiatric:  grossly normal mood and affect, speech fluent and appropriate, AOx3 Neurologic:  CN 2-12 grossly intact, moves all extremities in coordinated fashion, sensation intact    Radiological Exams on Admission: Dg Foot Complete Right  Result Date: 02/16/2018 CLINICAL DATA:  Toe surgery with open wound and bleeding, status post fall EXAM: RIGHT FOOT COMPLETE - 3+ VIEW COMPARISON:  11/16/2017 FINDINGS: Possible small intra-articular fracture lateral base of the first distal phalanx. Marked hallux valgus deformity at the first MTP with bony proliferative changes at the head of the first metatarsal and associated marked arthritis at the first MTP joint. Prior resection of the third digit at the level of the mid metatarsal. Chronic appearing deformity at the second proximal phalanx and head of the metatarsal. Small erosion at the head of the fifth proximal phalanx but similar appearance compared to prior radiograph. Large plantar calcaneal spur. Diffuse soft tissue swelling. IMPRESSION: 1. Possible small intra-articular fracture lateral base of the first distal phalanx 2. Chronic appearing deformities of the second proximal phalanx and the distal metatarsal with evidence of prior third digit resection at the level of the mid metatarsal. 3. Marked hallux valgus deformity at the first MTP joint with associated marked arthritis. Electronically Signed   By: Jasmine Pang M.D.   On: 02/16/2018 19:20    EKG: not done   Labs on Admission: I have personally reviewed the available labs and imaging studies at the time of the admission.  Pertinent labs:   Glucose 127 AP 127 Lactate 3.44 WBC 6.0 A1c 5.8 on  4/17  Assessment/Plan Principal Problem:   Diabetic foot infection (HCC) Active Problems:   Hypertension   OSA (obstructive sleep apnea)   Prediabetes   Alcohol dependence syndrome (HCC)   Marijuana abuse   Lower extremity wound infection -While initially called as cellulitis, this appears to be more consistent with venous stasis with dermatitis, tinea pedis overlay, and poor wound healing -The ulceration on his left toe was not as concerning as the remodeling changes and the wound dehiscence on the right and this raises concern for osteomyelitis -He appears to be at increased long-term risk for need for amputation - I have discussed this with the patient -Will admit, Med Surg -Antibiotics with Rocephin, Flagyl per diabetic foot ulcer order set; he had MSSA growth in 2/19 and so will not add Vanc at this time -With negative X-ray, will need MRI to assess for osteomyelitis -Likely needs consult by orthopedics or podiatry tomorrow but will wait for MRI results -Diabetic foot infection order set utilized, including orders for CM, SW, DM coordinator, wound care, and nutrition consult. -Goal would be for glucose <150 to facilitate wound healing. -Patient should be on bed rest, non-weight bearing. -Excellent BP control is needed  -He does not appear to have sepsis criteria and so the elevated lactate is not thought to be related to sepsis.  It is improved and we will continue to trend it.  DM -Patient with reasonably good recent A1c, but he has peripheral neuropathy and he has the habitus of a diabetic -He is not on medications at this time -Will check accuchecks and cover with moderate-scale SSI (no nighttime coverage)  HTN -Continue Lopressor -Suboptimal BP control thus far in the ER - would consider addition of an ACE if ongoing need arises  Alcohol dependence -Patient with chronic ETOH dependence -Denies h/o withdrawal including seizures but he appears to be at increased risk for  complications of withdrawal including seizures, DTs -CIWA protocol -Elevated LFTs are likely related to alcoholism  Marijuana abuse -Cessation encouraged; this should be encouraged on an ongoing basis -UDS ordered  OSA -He was previously diagnosed with OSA and unable to tolerate a full face mask -Will try nasal prongs CPAP and use autopap for now   DVT prophylaxis:   Lovenox  Code Status:  Full - confirmed with patient/family Family Communication:  Girlfriend present throughout evaluation  Disposition Plan:  Home once clinically improved Consults called: CM/SW/Nutrition/DM coordinator/wound care  Admission status: Admit - It is my clinical opinion that admission to INPATIENT is reasonable and necessary because of the expectation that this patient will require hospital care that crosses at least 2 midnights to treat this condition based on the medical complexity of the problems presented.  Given the aforementioned information, the predictability of an adverse outcome is felt to be significant.    Jonah Blue MD [facility]  If note is complete, please contact covering daytime or nighttime physician. [email or web address] Password [facility]  02/16/2018, 10:42 PM

## 2018-02-16 NOTE — ED Notes (Signed)
Notified EDP,Schlossman,MD., pt. I-stat CG4 Lactic acid 3.44 and RN,[place] made aware.

## 2018-02-16 NOTE — Progress Notes (Addendum)
Pharmacy note  A consult was received from an ED physician for vancomycin and Zosyn per pharmacy dosing.   The patient's profile has been reviewed for ht/wt/allergies/indication/available labs.    A one time order has been placed for vancomycin 2250 mg IV x1 and Zosyn 3.375 gr IV x1.  Further antibiotics/pharmacy consults should be ordered by admitting physician if indicated.                       Thank you,   Adalberto Cole, PharmD, BCPS Pager [phone] 02/16/2018 7:18 PM

## 2018-02-16 NOTE — ED Provider Notes (Signed)
[facility]-EMERGENCY DEPT Provider Note   CSN: [id number] Arrival date & time: 02/16/18  1552     History   Chief Complaint Chief Complaint  Patient presents with  . Wound Dehiscence  . Fall    HPI [name] is a 57 y.o. male.  HPI  57 year old male with a history of diabetes, hypertension, obesity and OSA presents with concern for fall, foot laceration, increasing erythema of the bilateral lower extremities, and chills.  Wife reports that he has been falling more.  Today he was sleeping in bed, rolled out of bed hitting his right foot.  He previously had surgery on that foot, but sustained a laceration to the right is totally different location.  He denies hitting his head, headache, nausea, vomiting.  Reports for the last 3 days or so he has had increasing redness in his bilateral lower extremities and swelling.  He previously been admitted for cellulitis for similar symptoms.  He has had chills over the last few days.  No known fever.  Denies any cough, urinary symptoms or other concerns.  Past Medical History:  Diagnosis Date  . Arthritis   . Diabetes (HCC)    recently diagnosed, "borderline"  . Diverticulitis    portion of colon removed  . Hip pain   . History of ventral hernia repair 2016  . Hypertension     Patient Active Problem List   Diagnosis Date Noted  . Diabetic foot infection (HCC) 02/16/2018  . Alcohol dependence syndrome (HCC) 02/16/2018  . Marijuana abuse 02/16/2018  . Diabetic ulcer of right midfoot associated with type 2 diabetes mellitus, with necrosis of muscle (HCC) 11/16/2017  . Foot ulcer (HCC) 12/21/2016  . Bilateral carpal tunnel syndrome 12/15/2016  . Fatty liver 12/14/2016  . Hyperlipidemia 11/25/2016  . Elevated liver enzymes 11/25/2016  . Prediabetes 11/25/2016  . Hypertension 11/23/2016  . Neuropathy 11/23/2016  . Toe amputation status, right (HCC) 11/23/2016  . Morbid obesity (HCC) 11/23/2016  . OSA  (obstructive sleep apnea) 11/23/2016    Past Surgical History:  Procedure Laterality Date  . HERNIA REPAIR  2016   ventral  . JOINT REPLACEMENT     b/l knees         Home Medications    Prior to Admission medications   Medication Sig Start Date End Date Taking? Authorizing Provider  Docusate Sodium (STOOL SOFTENER) 100 MG capsule Take 100 mg by mouth 2 (two) times daily as needed for constipation.   Yes [provider]  DULoxetine (CYMBALTA) 20 MG capsule Take 1 capsule (20 mg total) by mouth daily. 12/08/17  Yes Tarry Kos, MD  HYDROcodone-acetaminophen (NORCO) 10-325 MG tablet TAKE 1 TABLET BY MOUTH TWICE A DAY AS NEEDED FOR PAIN 01/17/18  Yes [provider]  metoprolol (LOPRESSOR) 100 MG tablet Take 1 tablet (100 mg total) by mouth 2 (two) times daily. 12/21/16  Yes Burns, Bobette Mo, MD  naproxen (NAPROSYN) 250 MG tablet Take 250 mg by mouth 2 (two) times daily with a meal.   Yes [provider]    Family History Family History  Adopted: Yes  Family history unknown: Yes    Social History Social History   Tobacco Use  . Smoking status: Former Games developer  . Smokeless tobacco: Never Used  Substance Use Topics  . Alcohol use: Yes    Comment: 2-3 a night,  the most ever 6+ in a night - he is down from 1/2 gallon every 2 days, now 1/2  gallon every 4 days  . Drug use: Yes    Frequency: 3.0 times per week    Types: Marijuana     Allergies   Claritin [loratadine]   Review of Systems Review of Systems  Constitutional: Positive for chills and fatigue. Negative for fever.  HENT: Negative for sore throat.   Eyes: Negative for visual disturbance.  Respiratory: Negative for shortness of breath.   Cardiovascular: Negative for chest pain.  Gastrointestinal: Negative for abdominal pain, nausea and vomiting.  Genitourinary: Negative for difficulty urinating.  Musculoskeletal: Positive for arthralgias. Negative for back pain and neck stiffness.  Skin:  Positive for rash.  Neurological: Negative for syncope and headaches.     Physical Exam Updated Vital Signs BP (!) 153/91 (BP Location: Left Arm)   Pulse 73   Temp 98 F (36.7 C) (Oral)   Resp 20   Ht 6\' 1"  (1.854 m)   Wt (!) 147.4 kg (324 lb 15.3 oz)   SpO2 96%   BMI 42.87 kg/m   Physical Exam  Constitutional: He is oriented to person, place, and time. He appears well-developed and well-nourished. No distress.  HENT:  Head: Normocephalic and atraumatic.  Eyes: Conjunctivae and EOM are normal.  Neck: Normal range of motion.  Cardiovascular: Normal rate, regular rhythm, normal heart sounds and intact distal pulses. Exam reveals no gallop and no friction rub.  No murmur heard. Pulmonary/Chest: Effort normal and breath sounds normal. No respiratory distress. He has no wheezes. He has no rales.  Abdominal: Soft. He exhibits no distension. There is no tenderness. There is no guarding.  Musculoskeletal: He exhibits no edema.  Neurological: He is alert and oriented to person, place, and time.  Skin: Skin is warm and dry. He is not diaphoretic.  Bilateral lower ext edema and erythema Laceration plantar side right foot. 4th toe  Nursing note and vitals reviewed.    ED Treatments / Results  Labs (all labs ordered are listed, but only abnormal results are displayed) Labs Reviewed  CBC WITH DIFFERENTIAL/PLATELET - Abnormal; Notable for the following components:      Result Value   RBC 3.50 (*)    HCT 37.4 (*)    MCV 106.9 (*)    MCH 37.4 (*)    All other components within normal limits  COMPREHENSIVE METABOLIC PANEL - Abnormal; Notable for the following components:   Glucose, Bld 127 (*)    Albumin 3.4 (*)    AST 66 (*)    Alkaline Phosphatase 127 (*)    All other components within normal limits  LACTIC ACID, PLASMA - Abnormal; Notable for the following components:   Lactic Acid, Venous 2.4 (*)    All other components within normal limits  LACTIC ACID, PLASMA - Abnormal;  Notable for the following components:   Lactic Acid, Venous 2.1 (*)    All other components within normal limits  SEDIMENTATION RATE - Abnormal; Notable for the following components:   Sed Rate 29 (*)    All other components within normal limits  BASIC METABOLIC PANEL - Abnormal; Notable for the following components:   Glucose, Bld 123 (*)    All other components within normal limits  CBC - Abnormal; Notable for the following components:   RBC 3.41 (*)    Hemoglobin 12.5 (*)    HCT 36.8 (*)    MCV 107.9 (*)    MCH 36.7 (*)    All other components within normal limits  RAPID URINE DRUG SCREEN, HOSP PERFORMED -  Abnormal; Notable for the following components:   Opiates POSITIVE (*)    Tetrahydrocannabinol POSITIVE (*)    All other components within normal limits  GLUCOSE, CAPILLARY - Abnormal; Notable for the following components:   Glucose-Capillary 127 (*)    All other components within normal limits  GLUCOSE, CAPILLARY - Abnormal; Notable for the following components:   Glucose-Capillary 119 (*)    All other components within normal limits  GLUCOSE, CAPILLARY - Abnormal; Notable for the following components:   Glucose-Capillary 110 (*)    All other components within normal limits  GLUCOSE, CAPILLARY - Abnormal; Notable for the following components:   Glucose-Capillary 121 (*)    All other components within normal limits  GLUCOSE, CAPILLARY - Abnormal; Notable for the following components:   Glucose-Capillary 108 (*)    All other components within normal limits  GLUCOSE, CAPILLARY - Abnormal; Notable for the following components:   Glucose-Capillary 121 (*)    All other components within normal limits  I-STAT CG4 LACTIC ACID, ED - Abnormal; Notable for the following components:   Lactic Acid, Venous 3.44 (*)    All other components within normal limits  I-STAT CG4 LACTIC ACID, ED - Abnormal; Notable for the following components:   Lactic Acid, Venous 2.11 (*)    All other  components within normal limits  CULTURE, BLOOD (ROUTINE X 2)  CULTURE, BLOOD (ROUTINE X 2)  C-REACTIVE PROTEIN  PREALBUMIN  HIV ANTIBODY (ROUTINE TESTING)  URIC ACID    EKG None  Radiology Mri Right Foot With And Without Contrast  Result Date: 02/17/2018 CLINICAL DATA:  Bilateral lower extremity swelling. History of diabetes. Evaluate for osteomyelitis. EXAM: MRI OF THE RIGHT FOREFOOT WITHOUT AND WITH CONTRAST TECHNIQUE: Multiplanar, multisequence MR imaging of the right forefoot was performed before and after the administration of intravenous contrast. CONTRAST:  20mL MULTIHANCE GADOBENATE DIMEGLUMINE 529 MG/ML IV SOLN COMPARISON:  Right foot x-rays from yesterday. FINDINGS: Bones/Joint/Cartilage No acute fracture or dislocation. Severe joint space narrowing and cartilage loss at the first MTP joint and first metatarsal sesamoidal joints. Small juxtacortical erosions of the first metatarsal head. Mild edema within the first metatarsal head. Prominent synovial enhancement of the first MTP joint. No joint effusion. Severe hallux valgus deformity. Prior amputation of the third toe to the level of the mid metatarsal. Chronic fracture deformity of the distal second metatarsal with secondary moderate osteoarthritis at the second MTP joint. Ligaments The toe collateral ligaments are intact. The Lisfranc ligament is intact. Muscles and Tendons Severe atrophy of the intrinsic muscles of the forefoot, consistent with diabetic muscle changes. Thickening and increased intermediate signal of the mid to distal flexor hallucis longus tendon with mild tendon sheath enhancement. Soft tissues Prominent soft tissue swelling over the dorsal foot without enhancement. No drainable fluid collection. IMPRESSION: 1. No evidence of osteomyelitis. 2. Small juxtacortical erosions of the first metatarsal head with mild first MTP joint synovial enhancement and tendinosis and tenosynovitis of the mid to distal flexor hallucis  longus tendon. Findings are nonspecific, but suspicious for gout or other inflammatory arthropathy. Infection is thought to be less likely given lack of joint effusion and surrounding soft tissue inflammatory changes. 3. Severe first MTP joint osteoarthritis and hallux valgus deformity. 4. Moderate nonenhancing soft tissue swelling over the dorsal foot, favored to reflect venous insufficiency. Electronically Signed   By: Obie Dredge M.D.   On: 02/17/2018 21:27   Dg Foot Complete Right  Result Date: 02/16/2018 CLINICAL DATA:  Toe surgery with open  wound and bleeding, status post fall EXAM: RIGHT FOOT COMPLETE - 3+ VIEW COMPARISON:  11/16/2017 FINDINGS: Possible small intra-articular fracture lateral base of the first distal phalanx. Marked hallux valgus deformity at the first MTP with bony proliferative changes at the head of the first metatarsal and associated marked arthritis at the first MTP joint. Prior resection of the third digit at the level of the mid metatarsal. Chronic appearing deformity at the second proximal phalanx and head of the metatarsal. Small erosion at the head of the fifth proximal phalanx but similar appearance compared to prior radiograph. Large plantar calcaneal spur. Diffuse soft tissue swelling. IMPRESSION: 1. Possible small intra-articular fracture lateral base of the first distal phalanx 2. Chronic appearing deformities of the second proximal phalanx and the distal metatarsal with evidence of prior third digit resection at the level of the mid metatarsal. 3. Marked hallux valgus deformity at the first MTP joint with associated marked arthritis. Electronically Signed   By: Jasmine Pang M.D.   On: 02/16/2018 19:20    Procedures Procedures (including critical care time)  Medications Ordered in ED Medications  DULoxetine (CYMBALTA) DR capsule 20 mg (20 mg Oral Given 02/18/18 1016)  HYDROcodone-acetaminophen (NORCO) 10-325 MG per tablet 1 tablet (1 tablet Oral Given 02/18/18 0750)   metoprolol tartrate (LOPRESSOR) tablet 100 mg (100 mg Oral Given 02/18/18 1015)  naproxen (NAPROSYN) tablet 250 mg (250 mg Oral Given 02/18/18 0750)  enoxaparin (LOVENOX) injection 40 mg (40 mg Subcutaneous Given 02/18/18 1016)  acetaminophen (TYLENOL) tablet 650 mg (has no administration in time range)    Or  acetaminophen (TYLENOL) suppository 650 mg (has no administration in time range)  docusate sodium (COLACE) capsule 100 mg (100 mg Oral Given 02/18/18 1016)  ondansetron (ZOFRAN) tablet 4 mg (has no administration in time range)    Or  ondansetron (ZOFRAN) injection 4 mg (has no administration in time range)  insulin aspart (novoLOG) injection 0-15 Units (0 Units Subcutaneous Not Given 02/17/18 1610)  cefTRIAXone (ROCEPHIN) 2 g in sodium chloride 0.9 % 100 mL IVPB (0 g Intravenous Stopped 02/18/18 0558)    And  metroNIDAZOLE (FLAGYL) IVPB 500 mg (0 mg Intravenous Stopped 02/18/18 0628)  LORazepam (ATIVAN) tablet 1 mg (1 mg Oral Given 02/17/18 2115)    Or  LORazepam (ATIVAN) injection 1 mg ( Intravenous See Alternative 02/17/18 2115)  thiamine (VITAMIN B-1) tablet 100 mg (100 mg Oral Given 02/18/18 1016)    Or  thiamine (B-1) injection 100 mg ( Intravenous See Alternative 02/18/18 1016)  folic acid (FOLVITE) tablet 1 mg (1 mg Oral Given 02/18/18 1016)  multivitamin with minerals tablet 1 tablet (1 tablet Oral Given 02/18/18 1015)  0.9 %  sodium chloride infusion ( Intravenous New Bag/Given 02/18/18 0128)  magnesium hydroxide (MILK OF MAGNESIA) suspension 15 mL (has no administration in time range)  collagenase (SANTYL) ointment ( Topical Given 02/17/18 1556)  nutrition supplement (JUVEN) (JUVEN) powder packet 1 packet (1 packet Oral Given 02/18/18 1015)  colchicine tablet 0.6 mg (has no administration in time range)  sodium chloride 0.9 % bolus 1,000 mL (0 mLs Intravenous Stopped 02/16/18 2129)  vancomycin (VANCOCIN) 2,250 mg in sodium chloride 0.9 % 500 mL IVPB (0 mg Intravenous Stopped 02/16/18 2225)  lidocaine  (PF) (XYLOCAINE) 1 % injection 5 mL (5 mLs Intradermal Given 02/16/18 1950)  sodium chloride 0.9 % bolus 1,000 mL (0 mLs Intravenous Stopped 02/16/18 2129)  piperacillin-tazobactam (ZOSYN) IVPB 3.375 g (0 g Intravenous Stopped 02/16/18 2255)  oxyCODONE (Oxy IR/ROXICODONE) immediate  release tablet 10 mg (10 mg Oral Given 02/16/18 2222)  gadobenate dimeglumine (MULTIHANCE) injection 20 mL (20 mLs Intravenous Contrast Given 02/17/18 1955)     Initial Impression / Assessment and Plan / ED Course  I have reviewed the triage vital signs and the nursing notes.  Pertinent labs & imaging results that were available during my care of the patient were reviewed by me and considered in my medical decision making (see chart for details).      57 year old male with a history of diabetes, hypertension, obesity and OSA presents with concern for fall, foot laceration, increasing erythema of the bilateral lower extremities, and chills. Fall with foot injury and no other signs of injury. Lac repair limited by patient's edema, and repaired per PA note.  XR shows possible first distal phalanx fracture.    Labs show elevated lactic acid, in setting of cellulitis and chills, concern for possible sepsis.  Given IV fluids, vanc, zosyn and will admit for further care.   Final Clinical Impressions(s) / ED Diagnoses   Final diagnoses:  Cellulitis of lower extremity, unspecified laterality  Lactic acidosis    ED Discharge Orders    None       Alvira Monday, MD 02/18/18 1155

## 2018-02-16 NOTE — ED Notes (Signed)
Patient coming from [facility]-states recent foot surgery-states he fell out of bed this am-states wound has dehisced

## 2018-02-16 NOTE — ED Provider Notes (Signed)
LACERATION REPAIR Performed by: Langston Masker Authorized by: Langston Masker Consent: Verbal consent obtained. Risks and benefits: risks, benefits and alternatives were discussed Consent given by: patient Patient identity confirmed: provided demographic data Prepped and Draped in normal sterile fashion Wound explored  Laceration Location: 2nd toe crease Laceration Length: 1cm  No Foreign Bodies seen or palpated  Anesthesia: local infiltration  Local anesthetic: lidocaine  Anesthetic total: 2 ml  Irrigation method: syringe Amount of cleaning: standard  Skin closure: simple  Number of sutures: 3  Technique: interupted  Patient tolerance: Patient tolerated the procedure well with no immediate complications.   [name] 02/16/18 2009    Alvira Monday, MD 02/18/18 1155

## 2018-02-17 ENCOUNTER — Inpatient Hospital Stay (HOSPITAL_COMMUNITY): Payer: BLUE CROSS/BLUE SHIELD

## 2018-02-17 ENCOUNTER — Ambulatory Visit: Payer: BLUE CROSS/BLUE SHIELD | Admitting: Physician Assistant

## 2018-02-17 DIAGNOSIS — F1029 Alcohol dependence with unspecified alcohol-induced disorder: Secondary | ICD-10-CM

## 2018-02-17 DIAGNOSIS — I10 Essential (primary) hypertension: Secondary | ICD-10-CM

## 2018-02-17 DIAGNOSIS — E11628 Type 2 diabetes mellitus with other skin complications: Secondary | ICD-10-CM

## 2018-02-17 DIAGNOSIS — G4733 Obstructive sleep apnea (adult) (pediatric): Secondary | ICD-10-CM

## 2018-02-17 DIAGNOSIS — L089 Local infection of the skin and subcutaneous tissue, unspecified: Secondary | ICD-10-CM

## 2018-02-17 DIAGNOSIS — L0390 Cellulitis, unspecified: Secondary | ICD-10-CM

## 2018-02-17 DIAGNOSIS — R7303 Prediabetes: Secondary | ICD-10-CM

## 2018-02-17 DIAGNOSIS — F1210 Cannabis abuse, uncomplicated: Secondary | ICD-10-CM

## 2018-02-17 LAB — BASIC METABOLIC PANEL
Anion gap: 11 (ref 5–15)
BUN: 8 mg/dL (ref 6–20)
CALCIUM: 9 mg/dL (ref 8.9–10.3)
CO2: 24 mmol/L (ref 22–32)
CREATININE: 0.69 mg/dL (ref 0.61–1.24)
Chloride: 103 mmol/L (ref 101–111)
GFR calc Af Amer: >60 mL/min (ref >60)
GFR calc non Af Amer: >60 mL/min (ref >60)
GLUCOSE: 123 mg/dL — AB (ref 65–99)
POTASSIUM: 3.9 mmol/L (ref 3.5–5.1)
Sodium: 138 mmol/L (ref 135–145)

## 2018-02-17 LAB — RAPID URINE DRUG SCREEN, HOSP PERFORMED
AMPHETAMINES: NOT DETECTED
BARBITURATES: NOT DETECTED
Benzodiazepines: NOT DETECTED
COCAINE: NOT DETECTED
OPIATES: POSITIVE — AB
Tetrahydrocannabinol: POSITIVE — AB

## 2018-02-17 LAB — GLUCOSE, CAPILLARY
GLUCOSE-CAPILLARY: 110 mg/dL — AB (ref 65–99)
GLUCOSE-CAPILLARY: 121 mg/dL — AB (ref 65–99)
Glucose-Capillary: 127 mg/dL — ABNORMAL HIGH (ref 65–99)
Glucose-Capillary: 119 mg/dL — ABNORMAL HIGH (ref 65–99)

## 2018-02-17 LAB — CBC
HEMATOCRIT: 36.8 % — AB (ref 39.0–52.0)
Hemoglobin: 12.5 g/dL — ABNORMAL LOW (ref 13.0–17.0)
MCH: 36.7 pg — ABNORMAL HIGH (ref 26.0–34.0)
MCHC: 34 g/dL (ref 30.0–36.0)
MCV: 107.9 fL — ABNORMAL HIGH (ref 78.0–100.0)
PLATELETS: 212 10*3/uL (ref 150–400)
RBC: 3.41 MIL/uL — ABNORMAL LOW (ref 4.22–5.81)
RDW: 14.3 % (ref 11.5–15.5)
WBC: 5.7 10*3/uL (ref 4.0–10.5)

## 2018-02-17 LAB — C-REACTIVE PROTEIN: CRP: 0.9 mg/dL (ref <1.0)

## 2018-02-17 LAB — HIV ANTIBODY (ROUTINE TESTING W REFLEX): HIV SCREEN 4TH GENERATION: NONREACTIVE

## 2018-02-17 LAB — LACTIC ACID, PLASMA
Lactic Acid, Venous: 2.4 mmol/L (ref 0.5–1.9)
Lactic Acid, Venous: 2.1 mmol/L (ref 0.5–1.9)

## 2018-02-17 LAB — PREALBUMIN: Prealbumin: 19.9 mg/dL (ref 18–38)

## 2018-02-17 LAB — SEDIMENTATION RATE: SED RATE: 29 mm/h — AB (ref 0–16)

## 2018-02-17 MED ORDER — ENOXAPARIN SODIUM 40 MG/0.4ML ~~LOC~~ SOLN
40.0000 mg | SUBCUTANEOUS | Status: DC
  Administered 2018-02-17 – 2018-02-20 (×4): 40 mg via SUBCUTANEOUS
  Filled 2018-02-17 (×4): qty 0.4

## 2018-02-17 MED ORDER — ONDANSETRON HCL 4 MG PO TABS: 4.0000 mg | ORAL_TABLET | Freq: Four times a day (QID) | ORAL | Status: DC | PRN

## 2018-02-17 MED ORDER — JUVEN PO PACK
1.0000 | PACK | Freq: Two times a day (BID) | ORAL | Status: DC
  Administered 2018-02-17 – 2018-02-20 (×4): 1 via ORAL
  Filled 2018-02-17 (×7): qty 1

## 2018-02-17 MED ORDER — ONDANSETRON HCL 4 MG/2ML IJ SOLN
4.0000 mg | Freq: Four times a day (QID) | INTRAMUSCULAR | Status: DC | PRN
  Administered 2018-02-18 – 2018-02-19 (×3): 4 mg via INTRAVENOUS
  Filled 2018-02-17 (×3): qty 2

## 2018-02-17 MED ORDER — METOPROLOL TARTRATE 50 MG PO TABS
100.0000 mg | ORAL_TABLET | Freq: Two times a day (BID) | ORAL | Status: DC
  Administered 2018-02-17 – 2018-02-20 (×8): 100 mg via ORAL
  Filled 2018-02-17 (×3): qty 2
  Filled 2018-02-17: qty 4
  Filled 2018-02-17 (×2): qty 2
  Filled 2018-02-17: qty 4
  Filled 2018-02-17: qty 2

## 2018-02-17 MED ORDER — INSULIN ASPART 100 UNIT/ML ~~LOC~~ SOLN
0.0000 [IU] | Freq: Three times a day (TID) | SUBCUTANEOUS | Status: DC
  Administered 2018-02-17 – 2018-02-18 (×2): 2 [IU] via SUBCUTANEOUS

## 2018-02-17 MED ORDER — NAPROXEN 500 MG PO TABS
250.0000 mg | ORAL_TABLET | Freq: Two times a day (BID) | ORAL | Status: DC
  Administered 2018-02-17 – 2018-02-20 (×7): 250 mg via ORAL
  Filled 2018-02-17 (×7): qty 1

## 2018-02-17 MED ORDER — GADOBENATE DIMEGLUMINE 529 MG/ML IV SOLN
20.0000 mL | Freq: Once | INTRAVENOUS | Status: AC | PRN
  Administered 2018-02-17: 20 mL via INTRAVENOUS

## 2018-02-17 MED ORDER — MAGNESIUM HYDROXIDE 400 MG/5ML PO SUSP
15.0000 mL | Freq: Every day | ORAL | Status: DC | PRN
  Filled 2018-02-17: qty 30

## 2018-02-17 MED ORDER — SODIUM CHLORIDE 0.9 % IV SOLN
INTRAVENOUS | Status: DC
  Administered 2018-02-17 – 2018-02-19 (×3): via INTRAVENOUS

## 2018-02-17 MED ORDER — ADULT MULTIVITAMIN W/MINERALS CH
1.0000 | ORAL_TABLET | Freq: Every day | ORAL | Status: DC
  Administered 2018-02-17 – 2018-02-20 (×4): 1 via ORAL
  Filled 2018-02-17 (×4): qty 1

## 2018-02-17 MED ORDER — DOCUSATE SODIUM 100 MG PO CAPS
100.0000 mg | ORAL_CAPSULE | Freq: Two times a day (BID) | ORAL | Status: DC
  Administered 2018-02-17 – 2018-02-20 (×6): 100 mg via ORAL
  Filled 2018-02-17 (×6): qty 1

## 2018-02-17 MED ORDER — ACETAMINOPHEN 650 MG RE SUPP: 650.0000 mg | Freq: Four times a day (QID) | RECTAL | Status: DC | PRN

## 2018-02-17 MED ORDER — DULOXETINE HCL 20 MG PO CPEP
20.0000 mg | ORAL_CAPSULE | Freq: Every day | ORAL | Status: DC
  Administered 2018-02-17 – 2018-02-20 (×4): 20 mg via ORAL
  Filled 2018-02-17 (×4): qty 1

## 2018-02-17 MED ORDER — HYDROCODONE-ACETAMINOPHEN 10-325 MG PO TABS
1.0000 | ORAL_TABLET | Freq: Four times a day (QID) | ORAL | Status: DC | PRN
  Administered 2018-02-17 – 2018-02-20 (×13): 1 via ORAL
  Filled 2018-02-17 (×13): qty 1

## 2018-02-17 MED ORDER — ACETAMINOPHEN 325 MG PO TABS: 650.0000 mg | ORAL_TABLET | Freq: Four times a day (QID) | ORAL | Status: DC | PRN

## 2018-02-17 MED ORDER — LORAZEPAM 1 MG PO TABS
1.0000 mg | ORAL_TABLET | Freq: Four times a day (QID) | ORAL | Status: AC | PRN
  Administered 2018-02-17: 1 mg via ORAL
  Filled 2018-02-17 (×2): qty 1

## 2018-02-17 MED ORDER — SODIUM CHLORIDE 0.9 % IV SOLN
2.0000 g | Freq: Every day | INTRAVENOUS | Status: DC
  Administered 2018-02-17 – 2018-02-19 (×3): 2 g via INTRAVENOUS
  Filled 2018-02-17 (×3): qty 2

## 2018-02-17 MED ORDER — VITAMIN B-1 100 MG PO TABS
100.0000 mg | ORAL_TABLET | Freq: Every day | ORAL | Status: DC
  Administered 2018-02-17 – 2018-02-20 (×4): 100 mg via ORAL
  Filled 2018-02-17 (×4): qty 1

## 2018-02-17 MED ORDER — METRONIDAZOLE IN NACL 5-0.79 MG/ML-% IV SOLN
500.0000 mg | Freq: Three times a day (TID) | INTRAVENOUS | Status: DC
  Administered 2018-02-17 – 2018-02-19 (×7): 500 mg via INTRAVENOUS
  Filled 2018-02-17 (×7): qty 100

## 2018-02-17 MED ORDER — THIAMINE HCL 100 MG/ML IJ SOLN: 100.0000 mg | Freq: Every day | INTRAMUSCULAR | Status: DC

## 2018-02-17 MED ORDER — LACTATED RINGERS IV SOLN: INTRAVENOUS | Status: DC

## 2018-02-17 MED ORDER — COLLAGENASE 250 UNIT/GM EX OINT
TOPICAL_OINTMENT | Freq: Every day | CUTANEOUS | Status: DC
Start: 2018-02-17 — End: 2018-02-20
  Administered 2018-02-17 – 2018-02-19 (×3): via TOPICAL
  Filled 2018-02-17: qty 90

## 2018-02-17 MED ORDER — FOLIC ACID 1 MG PO TABS
1.0000 mg | ORAL_TABLET | Freq: Every day | ORAL | Status: DC
  Administered 2018-02-17 – 2018-02-20 (×4): 1 mg via ORAL
  Filled 2018-02-17 (×4): qty 1

## 2018-02-17 MED ORDER — LORAZEPAM 2 MG/ML IJ SOLN: 1.0000 mg | Freq: Four times a day (QID) | INTRAMUSCULAR | Status: AC | PRN

## 2018-02-17 NOTE — Progress Notes (Signed)
PROGRESS NOTE    [name]  NWG:[id number] DOB: Mar 03, 1961 DOA: 02/16/2018 PCP: Binnie Rail, MD   Brief Narrative:  HPI on 02/16/2018 by Dr. Karmen Bongo [name] is a 57 y.o. male with medical history significant of HTN, diverticulitis, DM, and OA presenting with wound dehiscence from remote foot surgery.   Patient was sitting on the edge of his bed after not sleeping the night prior.  He fell asleep and hit the floor and his toe started bleeding.  This happened about  1240 pm today.  He didn't sleep because of OSA not on CPAP and bad hip pain.  His B LE are swollen and red for about 3-4 days.  No fevers, + chills.  No sick contacts.  He has been going to Dr. Erlinda Hong for his feet and then changed back to Dr. Derrek Gu with [facility] in [place] - he did the last operation, right foot second mallet toe correction and lateral sesamoidectomy on 2/28.  It has been healing well until now.  They also removed on ulcer on the ball of his foot and this has healed really nicely.  He had his third toe amputated in the past as well.  He was seen in clinic by Dr. Delfino Lovett earlier this week.  He has avascular necrosis of the right hip and a left diabetic foot ulcer.  He will need right THR but the left foot wound has to be healed completely first.  He was referred to wound care.  Assessment & Plan   Lower extremity wound infection -Initially referred to as cellulitis however appears to be bilateral and consistent with more venous stasis with dermatitis -Patient has held with orthopedics in the past for his right hip -Xray of the right foot showed possible small intra-articular fracture lateral base of the first distal phalanx.  Chronic appearing deformities of the second proximal phalanx of distant metatarsal with evidence of prior third digit resection.  Arthritis of the first MTP joint -CRP 0.9, ESR 29 -Pending blood cultures -Pending ABI -Pending MRI to assess for osteomyelitis -Placed on Rocephin  and Flagyl (in February 2019, patient was noted to have MSSA growth)  Diabetes mellitus, type II -Hemoglobin A1c 5.8 on 02/01/2018 -currently on no home medications -Continue insulin sliding scale CBG monitoring  Essential hypertension -Continue metoprolol  Alcohol dependence -Continue CIWA protocol -Monitor for withdrawals  Elevated AST -Suspect secondary to alcoholism -Continue to monitor  Marijuana abuse -Counseled  Obstructive sleep apnea -Continue CPAP nightly  DVT Prophylaxis  lovenox  Code Status: Full  Family Communication: None at bedside  Disposition Plan: admitted, pending workup  Consultants None  Procedures  ABI  Antibiotics   Anti-infectives (From admission, onward)   Start     Dose/Rate Route Frequency Ordered Stop   02/17/18 0400  metroNIDAZOLE (FLAGYL) IVPB 500 mg     500 mg 100 mL/hr over 60 Minutes Intravenous Every 8 hours 02/17/18 0227     02/17/18 0300  cefTRIAXone (ROCEPHIN) 2 g in sodium chloride 0.9 % 100 mL IVPB     2 g 200 mL/hr over 30 Minutes Intravenous Daily 02/17/18 0227     02/16/18 2100  piperacillin-tazobactam (ZOSYN) IVPB 3.375 g     3.375 g 100 mL/hr over 30 Minutes Intravenous  Once 02/16/18 2049 02/16/18 2255   02/16/18 1900  vancomycin (VANCOCIN) 2,250 mg in sodium chloride 0.9 % 500 mL IVPB     2,250 mg 250 mL/hr over 120 Minutes Intravenous  Once 02/16/18 1849 02/16/18  2225      Subjective:   [name] seen and examined today. Feels pain has improved. Feels redness has also improved. Feels he needs to have a bowel movement, but would like to use the commode. Denies current chest pain, shortness of breath, abdominal pain, N/V/d/C.   Objective:   Vitals:   02/17/18 0201 02/17/18 0233 02/17/18 0512 02/17/18 1045  BP:  (!) 156/79 (!) 150/88   Pulse:  76 73   Resp:  (!) 22 20   Temp:  98 F (36.7 C) 98.2 F (36.8 C)   TempSrc:  Oral Oral   SpO2: 95% 98% 96%   Weight:  (!) 146.5 kg (322 lb 15.6 oz)  (!)  147.4 kg (324 lb 15.3 oz)  Height:  _0  (1.854 m)      Intake/Output Summary (Last 24 hours) at 02/17/2018 1301 Last data filed at 02/17/2018 0900 Gross per 24 hour  Intake 483.33 ml  Output 900 ml  Net -416.67 ml   Filed Weights   02/17/18 0233 02/17/18 1045  Weight: (!) 146.5 kg (322 lb 15.6 oz) (!) 147.4 kg (324 lb 15.3 oz)    Exam  General: Well developed, well nourished, NAD, appears stated age  HEENT: NCAT, mucous membranes moist.   Neck: Supple  Cardiovascular: S1 S2 auscultated, no rubs, murmurs or gallops. Regular rate and rhythm.  Respiratory: Clear to auscultation bilaterally with equal chest rise  Abdomen: Soft, obese, nontender, nondistended, + bowel sounds  Extremities: warm dry without cyanosis clubbing. Erythema on LE B/L with edema, poor healing, venous stasis skin changes. Left r3rd toe ulceration, laceration of the right 2nd toe  Neuro: AAOx3, nonfocal  Psych: Appropriate mood and affect   Data Reviewed: I have personally reviewed following labs and imaging studies  CBC: Recent Labs  Lab 02/16/18 1958 02/17/18 0304  WBC 6.1 5.7  NEUTROABS 4.2  --   HGB 13.1 12.5*  HCT 37.4* 36.8*  MCV 106.9* 107.9*  PLT 241 710   Basic Metabolic Panel: Recent Labs  Lab 02/16/18 1958 02/17/18 0304  NA 140 138  K 4.1 3.9  CL 104 103  CO2 23 24  GLUCOSE 127* 123*  BUN 7 8  CREATININE 0.65 0.69  CALCIUM 9.8 9.0   GFR: Estimated Creatinine Clearance: 155.9 mL/min (by C-G formula based on SCr of 0.69 mg/dL). Liver Function Tests: Recent Labs  Lab 02/16/18 1958  AST 66*  ALT 52  ALKPHOS 127*  BILITOT 0.8  PROT 7.5  ALBUMIN 3.4*   No results for input(s): LIPASE, AMYLASE in the last 168 hours. No results for input(s): AMMONIA in the last 168 hours. Coagulation Profile: No results for input(s): INR, PROTIME in the last 168 hours. Cardiac Enzymes: No results for input(s): CKTOTAL, CKMB, CKMBINDEX, TROPONINI in the last 168 hours. BNP (last 3  results) No results for input(s): PROBNP in the last 8760 hours. HbA1C: No results for input(s): HGBA1C in the last 72 hours. CBG: Recent Labs  Lab 02/17/18 0741 02/17/18 1152  GLUCAP 127* 119*   Lipid Profile: No results for input(s): CHOL, HDL, LDLCALC, TRIG, CHOLHDL, LDLDIRECT in the last 72 hours. Thyroid Function Tests: No results for input(s): TSH, T4TOTAL, FREET4, T3FREE, THYROIDAB in the last 72 hours. Anemia Panel: No results for input(s): VITAMINB12, FOLATE, FERRITIN, TIBC, IRON, RETICCTPCT in the last 72 hours. Urine analysis: No results found for: COLORURINE, APPEARANCEUR, LABSPEC, PHURINE, GLUCOSEU, HGBUR, BILIRUBINUR, KETONESUR, PROTEINUR, UROBILINOGEN, NITRITE, LEUKOCYTESUR Sepsis Labs: _1 (procalcitonin:4,lacticidven:4)  )No results found for  this or any previous visit (from the past 240 hour(s)).    Radiology Studies: Dg Foot Complete Right  Result Date: 02/16/2018 CLINICAL DATA:  Toe surgery with open wound and bleeding, status post fall EXAM: RIGHT FOOT COMPLETE - 3+ VIEW COMPARISON:  11/16/2017 FINDINGS: Possible small intra-articular fracture lateral base of the first distal phalanx. Marked hallux valgus deformity at the first MTP with bony proliferative changes at the head of the first metatarsal and associated marked arthritis at the first MTP joint. Prior resection of the third digit at the level of the mid metatarsal. Chronic appearing deformity at the second proximal phalanx and head of the metatarsal. Small erosion at the head of the fifth proximal phalanx but similar appearance compared to prior radiograph. Large plantar calcaneal spur. Diffuse soft tissue swelling. IMPRESSION: 1. Possible small intra-articular fracture lateral base of the first distal phalanx 2. Chronic appearing deformities of the second proximal phalanx and the distal metatarsal with evidence of prior third digit resection at the level of the mid metatarsal. 3. Marked hallux valgus  deformity at the first MTP joint with associated marked arthritis. Electronically Signed   By: Donavan Foil M.D.   On: 02/16/2018 19:20     Scheduled Meds: . collagenase   Topical Daily  . docusate sodium  100 mg Oral BID  . DULoxetine  20 mg Oral Daily  . enoxaparin (LOVENOX) injection  40 mg Subcutaneous Q24H  . folic acid  1 mg Oral Daily  . insulin aspart  0-15 Units Subcutaneous TID WC  . metoprolol tartrate  100 mg Oral BID  . multivitamin with minerals  1 tablet Oral Daily  . naproxen  250 mg Oral BID WC  . nutrition supplement (JUVEN)  1 packet Oral BID BM  . thiamine  100 mg Oral Daily   Or  . thiamine  100 mg Intravenous Daily   Continuous Infusions: . sodium chloride 50 mL/hr at 02/17/18 0600  . cefTRIAXone (ROCEPHIN)  IV Stopped (02/17/18 0555)   And  . metronidazole Stopped (02/17/18 0431)     LOS: 1 day   Time Spent in minutes   45 minutes  Camrynn Mcclintic D.O. on 02/17/2018 at 1:01 PM  Between 7am to 7pm - Pager - [phone]  After 7pm go to [email or web address] - password [email or web address]  And look for the night coverage person covering for me after hours  [facility] Office  [phone]

## 2018-02-17 NOTE — Progress Notes (Signed)
Bilateral ABIs and TBIs completed - Preliminary results - Bilateral ABIs indicate normal arteial flow with normal triphasic waveforms throughout. Bilateral TBIs are normal. [employer], RVS 02/17/2018 8:49 AM

## 2018-02-17 NOTE — Consult Note (Signed)
WOC Nurse wound consult note Reason for Consult: Dehiscence of surgical wound to right foot second mallet toe correction.  Opened after a stumble from bed.   Chronic neuropathic wounds to left foot.  Will continue enzymatic debridement  Wound type:surgical and neuropathic Pressure Injury POA: NA Measurement: Right second toe- horizontal incision with 3 sutures.  Moist and weeping serosanguinous effluent.  Will add Xeroform gauze.  LEft dorsal foot:  1 cm x 1.5cm scabbed lesion LEft third toe is edematous and erythematous with 0.5 cm denuded skin in center.  Wound bed: Ruddy red to left foot wounds Drainage (amount, consistency, odor) minimal serosanguinous Periwound:edema erythema Dressing procedure/placement/frequency: Cleanse wound to left dorsal foot and third toe with NS.  Apply Santyl to wound bed.  Cover with dry dressing and tape.  Right second toe dehiscence"  Cleanse all toes with soap and water and pat dry.  Apply Xeroform gauze to suture line.  Cover with dry dressing.  CHange daily.  Will not follow at this time.  Please re-consult if needed.  Maple Hudson RN BSN CWON Pager [phone]

## 2018-02-17 NOTE — Progress Notes (Signed)
Results for SLYVESTER, LATONA (MRN [id number]) as of 02/17/2018 13:35  Ref. Range 02/17/2018 07:41 02/17/2018 11:52  Glucose-Capillary Latest Ref Range: 65 - 99 mg/dL 409 (H) 811 (H)  Spoke with patient about his HgbA1C of 5.8%.  States that he had been diagnosed with prediabetes. Not on any medications for diabetes. States that he does not eat much during the day.  He does drink a lot of Gatorade, some water, but no sodas. Suggested that he cut back on drinking the Gatorade and trying to drink more water. If he needs another drink to try using sugar free drinks. Watch CHO intake and amounts of food eaten, increase foods with fiber.  Given general information on general diabetes care. States that he does have neuropathy in both feet. Reviewed foot care. Follow up with PCP at discharge.   Will continue to monitor blood sugars while in the hospital.   Smith Mince RN BSN CDE Diabetes Coordinator Pager: [phone]  8am-5pm

## 2018-02-17 NOTE — Care Management Note (Signed)
Case Management Note  Patient Details  Name: [name] MRN: [id number] Date of Birth: April 15, 1961  Subjective/Objective:       57 yo admitted with Diabetic foot infection             Action/Plan: Pt from home with significant other. He states he has been dressing his foot with his girlfriend's help for awhile now and he doesn't think he will need a home health RN when he discharges. Pt also states that with his insurance he doesn't have any trouble getting his medications.  Expected Discharge Date:                  Expected Discharge Plan:  Home/Self Care  In-House Referral:     Discharge planning Services  CM Consult  Post Acute Care Choice:    Choice offered to:  Patient  DME Arranged:    DME Agency:     HH Arranged:    HH Agency:     Status of Service:  In process, will continue to follow  If discussed at Long Length of Stay Meetings, dates discussed:    Additional CommentsBartholome Bill, RN 02/17/2018, 1:19 PM  [phone]

## 2018-02-17 NOTE — Progress Notes (Signed)
LCSW consulted for access to meds.   RNCM can assist with access to meds. RNCM is consulted for patient.   LCSW notified RNCM of patient's needs.   LCSW signing off.  Beulah Gandy Appleton Long CSW [phone]

## 2018-02-17 NOTE — ED Notes (Signed)
ED TO INPATIENT HANDOFF REPORT  Name/Age/Gender [name] 57 y.o. male  Code Status   Home/SNF/Other Home  Chief Complaint fall/wound dehis  Level of Care/Admitting Diagnosis ED Disposition    ED Disposition Condition [facility] Area: [place] [[id number]]  Level of Care: Med-Surg [16]  Diagnosis: Diabetic foot infection [facility]) [[id number]]  Admitting Physician: Karmen Bongo [[id number]]  Attending Physician: Karmen Bongo [[id number]]  Estimated length of stay: 3 - 4 days  Certification:: I certify this patient will need inpatient services for at least 2 midnights  PT Class (Do Not Modify): Inpatient [101]  PT Acc Code (Do Not Modify): Private [1]       Medical History Past Medical History:  Diagnosis Date  . Arthritis   . Diabetes ([place])    recently diagnosed, "borderline"  . Diverticulitis    portion of colon removed  . Hip pain   . History of ventral hernia repair 2016  . Hypertension     Allergies Allergies  Allergen Reactions  . Claritin [Loratadine]     SOB    IV Location/Drains/Wounds Patient Lines/Drains/Airways Status   Active Line/Drains/Airways    None          Labs/Imaging Results for orders placed or performed during the hospital encounter of 02/16/18 (from the past 48 hour(s))  CBC with Differential     Status: Abnormal   Collection Time: 02/16/18  7:58 PM  Result Value Ref Range   WBC 6.1 4.0 - 10.5 K/uL   RBC 3.50 (L) 4.22 - 5.81 MIL/uL   Hemoglobin 13.1 13.0 - 17.0 g/dL   HCT 37.4 (L) 39.0 - 52.0 %   MCV 106.9 (H) 78.0 - 100.0 fL   MCH 37.4 (H) 26.0 - 34.0 pg   MCHC 35.0 30.0 - 36.0 g/dL   RDW 14.1 11.5 - 15.5 %   Platelets 241 150 - 400 K/uL   Neutrophils Relative % 69 %   Neutro Abs 4.2 1.7 - 7.7 K/uL   Lymphocytes Relative 21 %   Lymphs Abs 1.3 0.7 - 4.0 K/uL   Monocytes Relative 10 %   Monocytes Absolute 0.6 0.1 - 1.0 K/uL   Eosinophils Relative 0 %   Eosinophils Absolute 0.0 0.0 - 0.7 K/uL    Basophils Relative 0 %   Basophils Absolute 0.0 0.0 - 0.1 K/uL    Comment: Performed at [facility], [place] [address] [place], [place] [address]  Comprehensive metabolic panel     Status: Abnormal   Collection Time: 02/16/18  7:58 PM  Result Value Ref Range   Sodium 140 135 - 145 mmol/L   Potassium 4.1 3.5 - 5.1 mmol/L   Chloride 104 101 - 111 mmol/L   CO2 23 22 - 32 mmol/L   Glucose, Bld 127 (H) 65 - 99 mg/dL   BUN 7 6 - 20 mg/dL   Creatinine, Ser 0.65 0.61 - 1.24 mg/dL   Calcium 9.8 8.9 - 10.3 mg/dL   Total Protein 7.5 6.5 - 8.1 g/dL   Albumin 3.4 (L) 3.5 - 5.0 g/dL   AST 66 (H) 15 - 41 U/L   ALT 52 17 - 63 U/L   Alkaline Phosphatase 127 (H) 38 - 126 U/L   Total Bilirubin 0.8 0.3 - 1.2 mg/dL   GFR calc non Af Amer >60 >60 mL/min   GFR calc Af Amer >60 >60 mL/min    Comment: (NOTE) The eGFR has been calculated using the CKD EPI  equation. This calculation has not been validated in all clinical situations. eGFR's persistently <60 mL/min signify possible Chronic Kidney Disease.    Anion gap 13 5 - 15    Comment: Performed at [facility], [place] [address], [place], [place] [address]  I-Stat CG4 Lactic Acid, ED     Status: Abnormal   Collection Time: 02/16/18  8:07 PM  Result Value Ref Range   Lactic Acid, Venous 3.44 (HH) 0.5 - 1.9 mmol/L   Comment NOTIFIED PHYSICIAN   I-Stat CG4 Lactic Acid, ED     Status: Abnormal   Collection Time: 02/16/18 10:21 PM  Result Value Ref Range   Lactic Acid, Venous 2.11 (HH) 0.5 - 1.9 mmol/L   Comment NOTIFIED PHYSICIAN    Dg Foot Complete Right  Result Date: 02/16/2018 CLINICAL DATA:  Toe surgery with open wound and bleeding, status post fall EXAM: RIGHT FOOT COMPLETE - 3+ VIEW COMPARISON:  11/16/2017 FINDINGS: Possible small intra-articular fracture lateral base of the first distal phalanx. Marked hallux valgus deformity at the first MTP with bony proliferative changes at the head of the first metatarsal  and associated marked arthritis at the first MTP joint. Prior resection of the third digit at the level of the mid metatarsal. Chronic appearing deformity at the second proximal phalanx and head of the metatarsal. Small erosion at the head of the fifth proximal phalanx but similar appearance compared to prior radiograph. Large plantar calcaneal spur. Diffuse soft tissue swelling. IMPRESSION: 1. Possible small intra-articular fracture lateral base of the first distal phalanx 2. Chronic appearing deformities of the second proximal phalanx and the distal metatarsal with evidence of prior third digit resection at the level of the mid metatarsal. 3. Marked hallux valgus deformity at the first MTP joint with associated marked arthritis. Electronically Signed   By: Donavan Foil M.D.   On: 02/16/2018 19:20    Pending Labs Unresulted Labs (From admission, onward)   Start     Ordered   02/16/18 2238  Lactic acid, plasma  STAT Now then every 3 hours,   R     02/16/18 2249   02/16/18 1843  Blood culture (routine x 2)  BLOOD CULTURE X 2,   STAT     02/16/18 1844   Signed and Held  Sedimentation rate  Once,   R     Signed and Held   Signed and Held  C-reactive protein  Once,   R     Signed and Held   Signed and Held  Prealbumin  Once,   R     Signed and Held   Signed and Held  Creatinine, serum  (enoxaparin (LOVENOX)    CrCl >/= 30 ml/min)  Weekly,   R    Comments:  while on enoxaparin therapy    Signed and Held   Signed and Held  HIV antibody (Routine Testing)  Once,   R     Signed and Held   Signed and Occupational hygienist morning,   R     Signed and Held   Signed and Held  CBC  Tomorrow morning,   R     Signed and Held   Signed and Held  Urine rapid drug screen (hosp performed)  STAT,   R     Signed and Held      Vitals/Pain Today's Vitals   02/16/18 2200 02/17/18 0030 02/17/18 0100 02/17/18 0118  BP: (!) 152/79 (!) 162/99 (!) 171/75  Pulse: 81 79 78   Resp: (!) 21  (!) 21    Temp:      TempSrc:      SpO2: 93% 97% 94%   PainSc:    6     Isolation Precautions No active isolations  Medications Medications  metoprolol tartrate (LOPRESSOR) tablet 100 mg (has no administration in time range)  sodium chloride 0.9 % bolus 1,000 mL (0 mLs Intravenous Stopped 02/16/18 2129)  vancomycin (VANCOCIN) 2,250 mg in sodium chloride 0.9 % 500 mL IVPB (0 mg Intravenous Stopped 02/16/18 2225)  lidocaine (PF) (XYLOCAINE) 1 % injection 5 mL (5 mLs Intradermal Given 02/16/18 1950)  sodium chloride 0.9 % bolus 1,000 mL (0 mLs Intravenous Stopped 02/16/18 2129)  piperacillin-tazobactam (ZOSYN) IVPB 3.375 g (0 g Intravenous Stopped 02/16/18 2255)  oxyCODONE (Oxy IR/ROXICODONE) immediate release tablet 10 mg (10 mg Oral Given 02/16/18 2222)    Mobility non-ambulatory

## 2018-02-17 NOTE — Progress Notes (Signed)
CRITICAL VALUE STICKER  CRITICAL VALUE: Lactic Acid 2.4  RECEIVER (on-site recipient of call): [name]   DATE & TIME NOTIFIED: 02/17/18 0357  MESSENGER (representative from lab): Jory Sims  MD NOTIFIED: Rana Snare, MD  TIME OF NOTIFICATION: 0400  RESPONSE: no new orders

## 2018-02-17 NOTE — Progress Notes (Signed)
Initial Nutrition Assessment  DOCUMENTATION CODES:   Morbid obesity  INTERVENTION:   Juven Fruit Punch BID, each serving provides 95kcal and 2.5g of protein (amino acids glutamine and arginine)  NUTRITION DIAGNOSIS:   Increased nutrient needs related to wound healing as evidenced by estimated needs.  GOAL:   Patient will meet greater than or equal to 90% of their needs  MONITOR:   PO intake, Supplement acceptance, Weight trends, Labs, Skin  REASON FOR ASSESSMENT:   Consult Wound healing  ASSESSMENT:   Patient with PMH significant for HTN, diverticulitis, DM, s/p third toe amputation, and OA presenting with left toe wound dehiscence from remote foot surgery.     Spoke with pt at bedside. Reports having loss in appetite starting three weeks ago due to ongoing constipation. States he was afraid to eat because of the pain he felt. During this time period he would eat one meal that consisted of high protein food items like chicken, fish, and beef. Pt states he continues to drink vodka daily. Typically he would consume one 1/2 gallon of vodka every two days which has now decreased to a 1/2 gallon every 4-5 days. Pt also smokes cigarettes off and on. Discussed the impact alcohol has on nutrition status and how smoking impacts wound healing. Discussed the importance of protein intake for wound healing. Pt amendable to Juven this stay.   Pt endorses a UBW of 298 lb, the last time being at that weight 2 weeks ago at the doctors office. Records indicate pt weighed 298 lb 02/01/18 and 324 lb this admission. Nutrition-Focused physical exam completed. Fluid accumulation may be masking dry weight changes.   Medications reviewed and include: folic acid, MVI with minerals, thiamine, IV abx Labs reviewed.   NUTRITION - FOCUSED PHYSICAL EXAM:    Most Recent Value  Orbital Region  No depletion  Upper Arm Region  No depletion  Thoracic and Lumbar Region  Unable to assess  Buccal Region  No  depletion  Temple Region  No depletion  Clavicle Bone Region  No depletion  Clavicle and Acromion Bone Region  No depletion  Scapular Bone Region  Unable to assess  Dorsal Hand  No depletion  Patellar Region  No depletion  Anterior Thigh Region  No depletion  Posterior Calf Region  No depletion  Edema (RD Assessment)  Moderate     Diet Order:   Diet Order           Diet Carb Modified Fluid consistency: Thin; Room service appropriate? Yes  Diet effective now          EDUCATION NEEDS:   Education needs have been addressed  Skin:  Skin Assessment: Skin Integrity Issues: Skin Integrity Issues:: Diabetic Ulcer Diabetic Ulcer: left foot  Last BM:  02/16/18  Height:   Ht Readings from Last 1 Encounters:  02/17/18  (1.854 m)    Weight:   Wt Readings from Last 1 Encounters:  02/17/18 (!) 324 lb 15.3 oz (147.4 kg)    Ideal Body Weight:  83.6 kg  BMI:  Body mass index is 42.87 kg/m.  Estimated Nutritional Needs:   Kcal:  2350-2550 kcal  Protein:  120-130 g/day  Fluid:  >2.3 L/day    Vanessa Kick RD, LDN Clinical Nutrition Pager # - [phone]

## 2018-02-18 LAB — GLUCOSE, CAPILLARY
GLUCOSE-CAPILLARY: 108 mg/dL — AB (ref 65–99)
Glucose-Capillary: 121 mg/dL — ABNORMAL HIGH (ref 65–99)
Glucose-Capillary: 93 mg/dL (ref 65–99)
Glucose-Capillary: 92 mg/dL (ref 65–99)

## 2018-02-18 LAB — URIC ACID: Uric Acid, Serum: 7.2 mg/dL (ref 4.4–7.6)

## 2018-02-18 MED ORDER — COLCHICINE 0.6 MG PO TABS
1.2000 mg | ORAL_TABLET | Freq: Once | ORAL | Status: AC
  Administered 2018-02-18: 1.2 mg via ORAL
  Filled 2018-02-18: qty 2

## 2018-02-18 MED ORDER — COLCHICINE 0.6 MG PO TABS
0.6000 mg | ORAL_TABLET | Freq: Once | ORAL | Status: AC
  Administered 2018-02-18: 0.6 mg via ORAL
  Filled 2018-02-18: qty 1

## 2018-02-18 MED ORDER — COLCHICINE 0.6 MG PO TABS: 0.6000 mg | ORAL_TABLET | Freq: Two times a day (BID) | ORAL | Status: DC

## 2018-02-18 NOTE — Progress Notes (Signed)
Spoke to patient and he stated that he might try CPAP tonight with a nasal mask.  He stated that if/when he got ready to wear machine he will call this Clinical research associate.

## 2018-02-18 NOTE — Progress Notes (Signed)
PROGRESS NOTE    Montague Corella  KVQ:[id number] DOB: 25-Aug-1961 DOA: 02/16/2018 PCP: Binnie Rail, MD   Brief Narrative:  HPI on 02/16/2018 by Dr. Karmen Bongo Kirin Pastorino is a 57 y.o. male with medical history significant of HTN, diverticulitis, DM, and OA presenting with wound dehiscence from remote foot surgery.   Patient was sitting on the edge of his bed after not sleeping the night prior.  He fell asleep and hit the floor and his toe started bleeding.  This happened about  1240 pm today.  He didn't sleep because of OSA not on CPAP and bad hip pain.  His B LE are swollen and red for about 3-4 days.  No fevers, + chills.  No sick contacts.  He has been going to Dr. Erlinda Hong for his feet and then changed back to Dr. Derrek Gu with [facility] in [place] - he did the last operation, right foot second mallet toe correction and lateral sesamoidectomy on 2/28.  It has been healing well until now.  They also removed on ulcer on the ball of his foot and this has healed really nicely.  He had his third toe amputated in the past as well.  He was seen in clinic by Dr. Delfino Lovett earlier this week.  He has avascular necrosis of the right hip and a left diabetic foot ulcer.  He will need right THR but the left foot wound has to be healed completely first.  He was referred to wound care.  Assessment & Plan   Lower extremity wound infection -Initially referred to as cellulitis however appears to be bilateral and consistent with more venous stasis with dermatitis -Patient has held with orthopedics in the past for his right hip -Xray of the right foot showed possible small intra-articular fracture lateral base of the first distal phalanx.  Chronic appearing deformities of the second proximal phalanx of distant metatarsal with evidence of prior third digit resection.  Arthritis of the first MTP joint -CRP 0.9, ESR 29 -Pending blood cultures -ABI R 1.04, L 1.13 -MRI right foot: No evidence of osteomyelitis.  Small  juxtacortical erosions of the first metatarsal head with first mild MTP joint synovial enhancement tendinosis, tenosynovitis of the mid to distal flexor hallux longus tendon.  Suspicious for gout or other inflammatory arthropathy.  Severe first MTP joint osteoarthritis.  Severe first MTP joint osteoarthritis and hallux valgus deformity -Placed on Rocephin and Flagyl (in February 2019, patient was noted to have MSSA growth) -will obtain uric acid level and give dose of colchicine -discuss with ortho- agreed with colchicine and outpt follow up  Diabetes mellitus, type II -Hemoglobin A1c 5.8 on 02/01/2018 -currently on no home medications -Continue insulin sliding scale CBG monitoring -CBGs appear to be well controlled  Essential hypertension -Continue metoprolol  Alcohol dependence -Continue CIWA protocol -Monitor for withdrawals  Elevated AST -Suspect secondary to alcoholism -Continue to monitor  Marijuana abuse -Counseled  Obstructive sleep apnea -Continue CPAP nightly  DVT Prophylaxis  lovenox  Code Status: Full  Family Communication: None at bedside  Disposition Plan: admitted, [facility] home when pain improved  Consultants None  Procedures  ABI  Antibiotics   Anti-infectives (From admission, onward)   Start     Dose/Rate Route Frequency Ordered Stop   02/17/18 0400  metroNIDAZOLE (FLAGYL) IVPB 500 mg     500 mg 100 mL/hr over 60 Minutes Intravenous Every 8 hours 02/17/18 0227     02/17/18 0300  cefTRIAXone (ROCEPHIN) 2 g in sodium chloride  0.9 % 100 mL IVPB     2 g 200 mL/hr over 30 Minutes Intravenous Daily 02/17/18 0227     02/16/18 2100  piperacillin-tazobactam (ZOSYN) IVPB 3.375 g     3.375 g 100 mL/hr over 30 Minutes Intravenous  Once 02/16/18 2049 02/16/18 2255   02/16/18 1900  vancomycin (VANCOCIN) 2,250 mg in sodium chloride 0.9 % 500 mL IVPB     2,250 mg 250 mL/hr over 120 Minutes Intravenous  Once 02/16/18 1849 02/16/18 2225      Subjective:    [name] seen and examined today.  Feels pain and redness have improved.  Denies current chest pain, shortness of breath, abdominal pain, nausea vomiting, diarrhea constipation.  Does complain of right hip pain.  Objective:   Vitals:   02/17/18 1045 02/17/18 1434 02/17/18 2123 02/18/18 0529  BP:  (!) 164/82 (!) 154/78 (!) 153/91  Pulse:  66 71 73  Resp:  18 20 20   Temp:  97.8 F (36.6 C) 97.8 F (36.6 C) 98 F (36.7 C)  TempSrc:  Oral Oral Oral  SpO2:  95% 94% 96%  Weight: (!) 147.4 kg (324 lb 15.3 oz)     Height:        Intake/Output Summary (Last 24 hours) at 02/18/2018 1216 Last data filed at 02/18/2018 4680 Gross per 24 hour  Intake 2223.33 ml  Output 2150 ml  Net 73.33 ml   Filed Weights   02/17/18 0233 02/17/18 1045  Weight: (!) 146.5 kg (322 lb 15.6 oz) (!) 147.4 kg (324 lb 15.3 oz)   Exam  General: Well developed, well nourished, NAD, appears stated age  HEENT: NCAT, mucous membranes moist.   Neck: Supple  Cardiovascular: S1 S2 auscultated, RRR, no murmur  Respiratory: Clear to auscultation bilaterally with equal chest rise  Abdomen: Soft, nontender, nondistended, + bowel sounds  Extremities: warm dry without cyanosis clubbing.  Venous stasis skin changes on lower extremities.  Left third toe ulcerations.  Laceration on the right second toe.  Mild erythema noted on right lower extremity, improving.  Neuro: AAOx3, nonfocal  Psych: appropriate mood and affect, pleasant   Data Reviewed: I have personally reviewed following labs and imaging studies  CBC: Recent Labs  Lab 02/16/18 1958 02/17/18 0304  WBC 6.1 5.7  NEUTROABS 4.2  --   HGB 13.1 12.5*  HCT 37.4* 36.8*  MCV 106.9* 107.9*  PLT 241 321   Basic Metabolic Panel: Recent Labs  Lab 02/16/18 1958 02/17/18 0304  NA 140 138  K 4.1 3.9  CL 104 103  CO2 23 24  GLUCOSE 127* 123*  BUN 7 8  CREATININE 0.65 0.69  CALCIUM 9.8 9.0   GFR: Estimated Creatinine Clearance: 155.9 mL/min (by  C-G formula based on SCr of 0.69 mg/dL). Liver Function Tests: Recent Labs  Lab 02/16/18 1958  AST 66*  ALT 52  ALKPHOS 127*  BILITOT 0.8  PROT 7.5  ALBUMIN 3.4*   No results for input(s): LIPASE, AMYLASE in the last 168 hours. No results for input(s): AMMONIA in the last 168 hours. Coagulation Profile: No results for input(s): INR, PROTIME in the last 168 hours. Cardiac Enzymes: No results for input(s): CKTOTAL, CKMB, CKMBINDEX, TROPONINI in the last 168 hours. BNP (last 3 results) No results for input(s): PROBNP in the last 8760 hours. HbA1C: No results for input(s): HGBA1C in the last 72 hours. CBG: Recent Labs  Lab 02/17/18 1152 02/17/18 1605 02/17/18 2126 02/18/18 0730 02/18/18 1142  GLUCAP 119* 110* 121* 108*  121*   Lipid Profile: No results for input(s): CHOL, HDL, LDLCALC, TRIG, CHOLHDL, LDLDIRECT in the last 72 hours. Thyroid Function Tests: No results for input(s): TSH, T4TOTAL, FREET4, T3FREE, THYROIDAB in the last 72 hours. Anemia Panel: No results for input(s): VITAMINB12, FOLATE, FERRITIN, TIBC, IRON, RETICCTPCT in the last 72 hours. Urine analysis: No results found for: COLORURINE, APPEARANCEUR, LABSPEC, PHURINE, GLUCOSEU, HGBUR, BILIRUBINUR, KETONESUR, PROTEINUR, UROBILINOGEN, NITRITE, LEUKOCYTESUR Sepsis Labs: @LABRCNTIP (procalcitonin:4,lacticidven:4)  ) Recent Results (from the past 240 hour(s))  Blood culture (routine x 2)     Status: None (Preliminary result)   Collection Time: 02/16/18  8:01 PM  Result Value Ref Range Status   Specimen Description BLOOD RIGHT ANTECUBITAL  Final   Special Requests   Final    BOTTLES DRAWN AEROBIC AND ANAEROBIC Blood Culture adequate volume   Culture   Final    NO GROWTH < 24 HOURS Performed at [facility], [address] [place], [place] [address]    Report Status PENDING  Incomplete      Radiology Studies: Mri Right Foot With And Without Contrast  Result Date: 02/17/2018 CLINICAL DATA:   Bilateral lower extremity swelling. History of diabetes. Evaluate for osteomyelitis. EXAM: MRI OF THE RIGHT FOREFOOT WITHOUT AND WITH CONTRAST TECHNIQUE: Multiplanar, multisequence MR imaging of the right forefoot was performed before and after the administration of intravenous contrast. CONTRAST:  72m MULTIHANCE GADOBENATE DIMEGLUMINE 529 MG/ML IV SOLN COMPARISON:  Right foot x-rays from yesterday. FINDINGS: Bones/Joint/Cartilage No acute fracture or dislocation. Severe joint space narrowing and cartilage loss at the first MTP joint and first metatarsal sesamoidal joints. Small juxtacortical erosions of the first metatarsal head. Mild edema within the first metatarsal head. Prominent synovial enhancement of the first MTP joint. No joint effusion. Severe hallux valgus deformity. Prior amputation of the third toe to the level of the mid metatarsal. Chronic fracture deformity of the distal second metatarsal with secondary moderate osteoarthritis at the second MTP joint. Ligaments The toe collateral ligaments are intact. The Lisfranc ligament is intact. Muscles and Tendons Severe atrophy of the intrinsic muscles of the forefoot, consistent with diabetic muscle changes. Thickening and increased intermediate signal of the mid to distal flexor hallucis longus tendon with mild tendon sheath enhancement. Soft tissues Prominent soft tissue swelling over the dorsal foot without enhancement. No drainable fluid collection. IMPRESSION: 1. No evidence of osteomyelitis. 2. Small juxtacortical erosions of the first metatarsal head with mild first MTP joint synovial enhancement and tendinosis and tenosynovitis of the mid to distal flexor hallucis longus tendon. Findings are nonspecific, but suspicious for gout or other inflammatory arthropathy. Infection is thought to be less likely given lack of joint effusion and surrounding soft tissue inflammatory changes. 3. Severe first MTP joint osteoarthritis and hallux valgus deformity. 4.  Moderate nonenhancing soft tissue swelling over the dorsal foot, favored to reflect venous insufficiency. Electronically Signed   By: WTitus DubinM.D.   On: 02/17/2018 21:27   Dg Foot Complete Right  Result Date: 02/16/2018 CLINICAL DATA:  Toe surgery with open wound and bleeding, status post fall EXAM: RIGHT FOOT COMPLETE - 3+ VIEW COMPARISON:  11/16/2017 FINDINGS: Possible small intra-articular fracture lateral base of the first distal phalanx. Marked hallux valgus deformity at the first MTP with bony proliferative changes at the head of the first metatarsal and associated marked arthritis at the first MTP joint. Prior resection of the third digit at the level of the mid metatarsal. Chronic appearing deformity at the second proximal phalanx and  head of the metatarsal. Small erosion at the head of the fifth proximal phalanx but similar appearance compared to prior radiograph. Large plantar calcaneal spur. Diffuse soft tissue swelling. IMPRESSION: 1. Possible small intra-articular fracture lateral base of the first distal phalanx 2. Chronic appearing deformities of the second proximal phalanx and the distal metatarsal with evidence of prior third digit resection at the level of the mid metatarsal. 3. Marked hallux valgus deformity at the first MTP joint with associated marked arthritis. Electronically Signed   By: Donavan Foil M.D.   On: 02/16/2018 19:20     Scheduled Meds: . colchicine  0.6 mg Oral BID  . collagenase   Topical Daily  . docusate sodium  100 mg Oral BID  . DULoxetine  20 mg Oral Daily  . enoxaparin (LOVENOX) injection  40 mg Subcutaneous Q24H  . folic acid  1 mg Oral Daily  . insulin aspart  0-15 Units Subcutaneous TID WC  . metoprolol tartrate  100 mg Oral BID  . multivitamin with minerals  1 tablet Oral Daily  . naproxen  250 mg Oral BID WC  . nutrition supplement (JUVEN)  1 packet Oral BID BM  . thiamine  100 mg Oral Daily   Or  . thiamine  100 mg Intravenous Daily    Continuous Infusions: . sodium chloride 50 mL/hr at 02/18/18 0128  . cefTRIAXone (ROCEPHIN)  IV Stopped (02/18/18 0052)   And  . metronidazole Stopped (02/18/18 5910)     LOS: 2 days   Time Spent in minutes   45 minutes  Leaman Abe D.O. on 02/18/2018 at 12:16 PM  Between 7am to 7pm - Pager - [phone]  After 7pm go to [email or web address] - password [email or web address]  And look for the night coverage person covering for me after hours  [facility] Office  [phone]

## 2018-02-18 NOTE — Progress Notes (Signed)
Offered CPAP to patient and he stated that he could not tolerate it.  Requested patient to call if he changes his mind.

## 2018-02-19 LAB — GLUCOSE, CAPILLARY
GLUCOSE-CAPILLARY: 113 mg/dL — AB (ref 65–99)
GLUCOSE-CAPILLARY: 108 mg/dL — AB (ref 65–99)
Glucose-Capillary: 116 mg/dL — ABNORMAL HIGH (ref 65–99)
Glucose-Capillary: 110 mg/dL — ABNORMAL HIGH (ref 65–99)

## 2018-02-19 MED ORDER — LOPERAMIDE HCL 2 MG PO CAPS
2.0000 mg | ORAL_CAPSULE | ORAL | Status: DC | PRN
  Administered 2018-02-19 (×3): 2 mg via ORAL
  Filled 2018-02-19 (×3): qty 1

## 2018-02-19 MED ORDER — SACCHAROMYCES BOULARDII 250 MG PO CAPS
250.0000 mg | ORAL_CAPSULE | Freq: Two times a day (BID) | ORAL | Status: DC
  Administered 2018-02-19 – 2018-02-20 (×3): 250 mg via ORAL
  Filled 2018-02-19 (×3): qty 1

## 2018-02-19 MED ORDER — AMOXICILLIN-POT CLAVULANATE 875-125 MG PO TABS
1.0000 | ORAL_TABLET | Freq: Two times a day (BID) | ORAL | Status: DC
  Administered 2018-02-19 – 2018-02-20 (×3): 1 via ORAL
  Filled 2018-02-19 (×3): qty 1

## 2018-02-19 NOTE — Progress Notes (Signed)
Pt has declined use of CPAP QHS and requested RT to remove machine from room.  Pt states that he's tried it before and just can't tolerate it.  RT to monitor and assess as needed.

## 2018-02-19 NOTE — Progress Notes (Addendum)
PROGRESS NOTE    [name]  WUX:[id number] DOB: 02-20-1961 DOA: 02/16/2018 PCP: Binnie Rail, MD   Brief Narrative:  HPI on 02/16/2018 by Dr. Karmen Bongo Dionisios [name] is a 57 y.o. male with medical history significant of HTN, diverticulitis, DM, and OA presenting with wound dehiscence from remote foot surgery.   Patient was sitting on the edge of his bed after not sleeping the night prior.  He fell asleep and hit the floor and his toe started bleeding.  This happened about  1240 pm today.  He didn't sleep because of OSA not on CPAP and bad hip pain.  His B LE are swollen and red for about 3-4 days.  No fevers, + chills.  No sick contacts.  He has been going to Dr. Erlinda Hong for his feet and then changed back to Dr. Derrek Gu with [facility] in [place] - he did the last operation, right foot second mallet toe correction and lateral sesamoidectomy on 2/28.  It has been healing well until now.  They also removed on ulcer on the ball of his foot and this has healed really nicely.  He had his third toe amputated in the past as well.  He was seen in clinic by Dr. Delfino Lovett earlier this week.  He has avascular necrosis of the right hip and a left diabetic foot ulcer.  He will need right THR but the left foot wound has to be healed completely first.  He was referred to wound care.  Interim history  Admitted for LE infection. MRI showed possible gout. Given colchicine, and currently on antibiotics. Slowly improving.   Assessment & Plan   Lower extremity wound infection -Initially referred to as cellulitis however appears to be bilateral and consistent with more venous stasis with dermatitis -Patient has held with orthopedics in the past for his right hip -Xray of the right foot showed possible small intra-articular fracture lateral base of the first distal phalanx.  Chronic appearing deformities of the second proximal phalanx of distant metatarsal with evidence of prior third digit resection.  Arthritis  of the first MTP joint -CRP 0.9, ESR 29 -Blood cultures show no growth -ABI R 1.04, L 1.13 -MRI right foot: No evidence of osteomyelitis.  Small juxtacortical erosions of the first metatarsal head with first mild MTP joint synovial enhancement tendinosis, tenosynovitis of the mid to distal flexor hallux longus tendon.  Suspicious for gout or other inflammatory arthropathy.  Severe first MTP joint osteoarthritis.  Severe first MTP joint osteoarthritis and hallux valgus deformity -Placed on Rocephin and Flagyl (in February 2019, patient was noted to have MSSA growth) -discuss with ortho- agreed with colchicine and outpt follow up -Uric acid 7.2 -given colchicine 1.44m followed by 0.680mone hour later -pain and erythema in RLE improving -Currently on ceftriaxone and flagyl, will transition patient to oral antibiotics- augmentin  Diabetes mellitus, type II -Hemoglobin A1c 5.8 on 02/01/2018 -currently on no home medications -Continue insulin sliding scale CBG monitoring -CBGs appear to be well controlled  Essential hypertension -Continue metoprolol  Alcohol dependence -Continue CIWA protocol -Monitor for withdrawals  Elevated AST -Suspect secondary to alcoholism -Continue to monitor  Marijuana abuse -Counseled  Obstructive sleep apnea -Continue CPAP nightly  Diarrhea -suspect secondary to antibiotics vs colchicine, will give imodium and start on probiotic -currently no leukocytosis and afebrile  DVT Prophylaxis  lovenox  Code Status: Full  Family Communication: None at bedside  Disposition Plan: admitted, [facility] when pain improved- suspect on 5/6  Consultants  None  Procedures  ABI  Antibiotics   Anti-infectives (From admission, onward)   Start     Dose/Rate Route Frequency Ordered Stop   02/17/18 0400  metroNIDAZOLE (FLAGYL) IVPB 500 mg     500 mg 100 mL/hr over 60 Minutes Intravenous Every 8 hours 02/17/18 0227     02/17/18 0300  cefTRIAXone (ROCEPHIN) 2 g in  sodium chloride 0.9 % 100 mL IVPB     2 g 200 mL/hr over 30 Minutes Intravenous Daily 02/17/18 0227     02/16/18 2100  piperacillin-tazobactam (ZOSYN) IVPB 3.375 g     3.375 g 100 mL/hr over 30 Minutes Intravenous  Once 02/16/18 2049 02/16/18 2255   02/16/18 1900  vancomycin (VANCOCIN) 2,250 mg in sodium chloride 0.9 % 500 mL IVPB     2,250 mg 250 mL/hr over 120 Minutes Intravenous  Once 02/16/18 1849 02/16/18 2225      Subjective:   [name] seen and examined today.  Feels pain is improved as well as redness in the right lower extremity.  Currently experiencing some diarrhea.  Complains of hip pain.  Denies current chest pain, shortness of breath, abdominal pain, nausea or vomiting, dizziness or headache.  Objective:   Vitals:   02/18/18 2005 02/18/18 2348 02/19/18 0503 02/19/18 0553  BP:  (!) 157/74 (!) 156/130 140/85  Pulse:  74 67   Resp:   17   Temp:   97.7 F (36.5 C)   TempSrc:   Oral   SpO2: 92%  92%   Weight:      Height:        Intake/Output Summary (Last 24 hours) at 02/19/2018 1106 Last data filed at 02/19/2018 0713 Gross per 24 hour  Intake 480 ml  Output 1900 ml  Net -1420 ml   Filed Weights   02/17/18 0233 02/17/18 1045  Weight: (!) 146.5 kg (322 lb 15.6 oz) (!) 147.4 kg (324 lb 15.3 oz)   Exam  General: Well developed, well nourished, NAD, appears stated age  HEENT: NCAT, mucous membranes moist.   Neck: Supple  Cardiovascular: S1 S2 auscultated, no rubs, murmurs or gallops. Regular rate and rhythm.  Respiratory: Clear to auscultation bilaterally with equal chest rise  Abdomen: Soft, obese, nontender, nondistended, + bowel sounds  Extremities: warm dry without cyanosis clubbing.  Venous stasis skin changes on lower extremities.  Left third toe ulceration, laceration on right second toe.  Mild erythema on the right lower extremity, currently improving  Neuro: AAOx3, nonfocal  Psych: Normal affect and demeanor with intact judgement and  insight    Data Reviewed: I have personally reviewed following labs and imaging studies  CBC: Recent Labs  Lab 02/16/18 1958 02/17/18 0304  WBC 6.1 5.7  NEUTROABS 4.2  --   HGB 13.1 12.5*  HCT 37.4* 36.8*  MCV 106.9* 107.9*  PLT 241 790   Basic Metabolic Panel: Recent Labs  Lab 02/16/18 1958 02/17/18 0304  NA 140 138  K 4.1 3.9  CL 104 103  CO2 23 24  GLUCOSE 127* 123*  BUN 7 8  CREATININE 0.65 0.69  CALCIUM 9.8 9.0   GFR: Estimated Creatinine Clearance: 155.9 mL/min (by C-G formula based on SCr of 0.69 mg/dL). Liver Function Tests: Recent Labs  Lab 02/16/18 1958  AST 66*  ALT 52  ALKPHOS 127*  BILITOT 0.8  PROT 7.5  ALBUMIN 3.4*   No results for input(s): LIPASE, AMYLASE in the last 168 hours. No results for input(s): AMMONIA in the last  168 hours. Coagulation Profile: No results for input(s): INR, PROTIME in the last 168 hours. Cardiac Enzymes: No results for input(s): CKTOTAL, CKMB, CKMBINDEX, TROPONINI in the last 168 hours. BNP (last 3 results) No results for input(s): PROBNP in the last 8760 hours. HbA1C: No results for input(s): HGBA1C in the last 72 hours. CBG: Recent Labs  Lab 02/18/18 0730 02/18/18 1142 02/18/18 1705 02/18/18 2010 02/19/18 0738  GLUCAP 108* 121* 93 92 116*   Lipid Profile: No results for input(s): CHOL, HDL, LDLCALC, TRIG, CHOLHDL, LDLDIRECT in the last 72 hours. Thyroid Function Tests: No results for input(s): TSH, T4TOTAL, FREET4, T3FREE, THYROIDAB in the last 72 hours. Anemia Panel: No results for input(s): VITAMINB12, FOLATE, FERRITIN, TIBC, IRON, RETICCTPCT in the last 72 hours. Urine analysis: No results found for: COLORURINE, APPEARANCEUR, [place], [place], GLUCOSEU, HGBUR, BILIRUBINUR, KETONESUR, PROTEINUR, UROBILINOGEN, NITRITE, LEUKOCYTESUR Sepsis Labs: _0 (procalcitonin:4,lacticidven:4)  ) Recent Results (from the past 240 hour(s))  Blood culture (routine x 2)     Status: None (Preliminary  result)   Collection Time: 02/16/18  8:01 PM  Result Value Ref Range Status   Specimen Description BLOOD RIGHT ANTECUBITAL  Final   Special Requests   Final    BOTTLES DRAWN AEROBIC AND ANAEROBIC Blood Culture adequate volume   Culture   Final    NO GROWTH 2 DAYS Performed at [facility], [address], [place], [place] [address]    Report Status PENDING  Incomplete  Blood culture (routine x 2)     Status: None (Preliminary result)   Collection Time: 02/16/18 10:14 PM  Result Value Ref Range Status   Specimen Description   Final    BLOOD RIGHT HAND Performed at [facility] [address] [place], [place] [address]    Special Requests   Final    BOTTLES DRAWN AEROBIC AND ANAEROBIC Blood Culture adequate volume Performed at [facility] [address], [place],  [address]    Culture   Final    NO GROWTH 1 DAY Performed at [facility], [place] [address], [place],  [address]    Report Status PENDING  Incomplete      Radiology Studies: Mri Right Foot With And Without Contrast  Result Date: 02/17/2018 CLINICAL DATA:  Bilateral lower extremity swelling. History of diabetes. Evaluate for osteomyelitis. EXAM: MRI OF THE RIGHT FOREFOOT WITHOUT AND WITH CONTRAST TECHNIQUE: Multiplanar, multisequence MR imaging of the right forefoot was performed before and after the administration of intravenous contrast. CONTRAST:  26m MULTIHANCE GADOBENATE DIMEGLUMINE 529 MG/ML IV SOLN COMPARISON:  Right foot x-rays from yesterday. FINDINGS: Bones/Joint/Cartilage No acute fracture or dislocation. Severe joint space narrowing and cartilage loss at the first MTP joint and first metatarsal sesamoidal joints. Small juxtacortical erosions of the first metatarsal head. Mild edema within the first metatarsal head. Prominent synovial enhancement of the first MTP joint. No joint effusion. Severe hallux valgus deformity. Prior amputation of the third  toe to the level of the mid metatarsal. Chronic fracture deformity of the distal second metatarsal with secondary moderate osteoarthritis at the second MTP joint. Ligaments The toe collateral ligaments are intact. The Lisfranc ligament is intact. Muscles and Tendons Severe atrophy of the intrinsic muscles of the forefoot, consistent with diabetic muscle changes. Thickening and increased intermediate signal of the mid to distal flexor hallucis longus tendon with mild tendon sheath enhancement. Soft tissues Prominent soft tissue swelling over the dorsal foot without enhancement. No drainable fluid collection. IMPRESSION: 1.  No evidence of osteomyelitis. 2. Small juxtacortical erosions of the first metatarsal head with mild first MTP joint synovial enhancement and tendinosis and tenosynovitis of the mid to distal flexor hallucis longus tendon. Findings are nonspecific, but suspicious for gout or other inflammatory arthropathy. Infection is thought to be less likely given lack of joint effusion and surrounding soft tissue inflammatory changes. 3. Severe first MTP joint osteoarthritis and hallux valgus deformity. 4. Moderate nonenhancing soft tissue swelling over the dorsal foot, favored to reflect venous insufficiency. Electronically Signed   By: Titus Dubin M.D.   On: 02/17/2018 21:27     Scheduled Meds: . collagenase   Topical Daily  . docusate sodium  100 mg Oral BID  . DULoxetine  20 mg Oral Daily  . enoxaparin (LOVENOX) injection  40 mg Subcutaneous Q24H  . folic acid  1 mg Oral Daily  . insulin aspart  0-15 Units Subcutaneous TID WC  . metoprolol tartrate  100 mg Oral BID  . multivitamin with minerals  1 tablet Oral Daily  . naproxen  250 mg Oral BID WC  . nutrition supplement (JUVEN)  1 packet Oral BID BM  . thiamine  100 mg Oral Daily   Or  . thiamine  100 mg Intravenous Daily   Continuous Infusions: . sodium chloride 50 mL/hr at 02/18/18 0128  . cefTRIAXone (ROCEPHIN)  IV Stopped  (02/19/18 2119)   And  . metronidazole Stopped (02/19/18 0719)     LOS: 3 days   Time Spent in minutes   30 minutes  Rosamaria Donn D.O. on 02/19/2018 at 11:06 AM  Between 7am to 7pm - Pager - [phone]  After 7pm go to [email or web address] - password [email or web address]  And look for the night coverage person covering for me after hours  [facility] Office  [phone]

## 2018-02-20 LAB — BASIC METABOLIC PANEL
Anion gap: 12 (ref 5–15)
BUN: 9 mg/dL (ref 6–20)
CHLORIDE: 102 mmol/L (ref 101–111)
CO2: 23 mmol/L (ref 22–32)
CREATININE: 0.75 mg/dL (ref 0.61–1.24)
Calcium: 9.1 mg/dL (ref 8.9–10.3)
GFR calc non Af Amer: >60 mL/min (ref >60)
Glucose, Bld: 97 mg/dL (ref 65–99)
Potassium: 3.6 mmol/L (ref 3.5–5.1)
Sodium: 137 mmol/L (ref 135–145)

## 2018-02-20 LAB — GLUCOSE, CAPILLARY
Glucose-Capillary: 87 mg/dL (ref 65–99)
Glucose-Capillary: 86 mg/dL (ref 65–99)

## 2018-02-20 MED ORDER — HYDROCODONE-ACETAMINOPHEN 10-325 MG PO TABS: 1.0000 | ORAL_TABLET | Freq: Four times a day (QID) | ORAL | 0 refills | Status: DC | PRN

## 2018-02-20 MED ORDER — FOLIC ACID 1 MG PO TABS: 1.0000 mg | ORAL_TABLET | Freq: Every day | ORAL | 0 refills | Status: DC

## 2018-02-20 MED ORDER — ADULT MULTIVITAMIN W/MINERALS CH: 1.0000 | ORAL_TABLET | Freq: Every day | ORAL | 0 refills | Status: DC

## 2018-02-20 MED ORDER — THIAMINE HCL 100 MG PO TABS: 100.0000 mg | ORAL_TABLET | Freq: Every day | ORAL | 0 refills | Status: DC

## 2018-02-20 MED ORDER — SACCHAROMYCES BOULARDII 250 MG PO CAPS: 250.0000 mg | ORAL_CAPSULE | Freq: Two times a day (BID) | ORAL | 0 refills | Status: DC

## 2018-02-20 MED ORDER — AMOXICILLIN-POT CLAVULANATE 875-125 MG PO TABS: 1.0000 | ORAL_TABLET | Freq: Two times a day (BID) | ORAL | 0 refills | Status: DC

## 2018-02-20 MED ORDER — COLLAGENASE 250 UNIT/GM EX OINT: TOPICAL_OINTMENT | Freq: Every day | CUTANEOUS | 0 refills | Status: DC

## 2018-02-20 MED ORDER — JUVEN PO PACK: 1.0000 | PACK | Freq: Two times a day (BID) | ORAL | 0 refills | Status: DC

## 2018-02-20 MED ORDER — LOPERAMIDE HCL 2 MG PO CAPS: 2.0000 mg | ORAL_CAPSULE | ORAL | 0 refills | Status: DC | PRN

## 2018-02-20 NOTE — Progress Notes (Addendum)
Discharge instructions explained to patient and his girlfriend. Prescription to patient, Patient states understanding instructions. Discharge via wheelchair. Girlfriend taking him home. No diarrhea today.

## 2018-02-20 NOTE — Discharge Summary (Signed)
Physician Discharge Summary  [name] YPP:[id number] DOB: 04-24-1961 DOA: 02/16/2018  PCP: Binnie Rail, MD  Admit date: 02/16/2018 Discharge date: 02/20/2018  Time spent: 45 minutes  Recommendations for Outpatient Follow-up:  Patient will be discharged to home.  Patient will need to follow up with primary care provider within one week of discharge, repeat CMP.  Follow up with orthopedics in one week. Patient should continue medications as prescribed.  Patient should follow a heart healthy/carb modified diet.   Discharge Diagnoses:  Lower extremity wound infection Diabetes mellitus, type II Essential hypertension Alcohol dependence Elevated AST Marijuana abuse Obstructive sleep apnea Diarrhea  Discharge Condition: stable  Diet recommendation: heart healthy/carb modified  Filed Weights   02/17/18 0233 02/17/18 1045  Weight: (!) 146.5 kg (322 lb 15.6 oz) (!) 147.4 kg (324 lb 15.3 oz)    History of present illness:  on 02/16/2018 by Dr. Ruben Reason Milleris a 57 y.o.malewith medical history significant ofHTN, diverticulitis, DM, and OA presenting with wound dehiscencefrom remote foot surgery.Patient was sitting onthe edge of his bed after not sleeping the night prior. He fell asleep and hit the floor and his toe started bleeding. This happened about 1240 pmtoday. He didn't sleep because of OSA not on CPAP and bad hip pain. His B LE are swollen and red for about 3-4 days. No fevers, + chills. No sick contacts. He has been going to Dr. Erlinda Hong for his feet and then changed back to Dr. Barrington [place] in [place] - he did the last operation, right foot second mallet toe correction and lateral sesamoidectomyon 2/28. It has been healing well until now. They also removed on ulcer on the ball of his foot and this has healed really nicely.He had his third toe amputated in the past as well.  He was seen in clinic by Dr. Delfino Lovett earlier this week. He has  avascular necrosis of the right hip and a left diabetic foot ulcer. He will need right THR but the left foot wound has to be healed completely first. He was referred to wound care.  Hospital Course:  Lower extremity wound infection -Initially referred to as cellulitis however appears to be bilateral and consistent with more venous stasis with dermatitis -Patient has held with orthopedics in the past for his right hip -Xray of the right foot showed possible small intra-articular fracture lateral base of the first distal phalanx.  Chronic appearing deformities of the second proximal phalanx of distant metatarsal with evidence of prior third digit resection.  Arthritis of the first MTP joint -CRP 0.9, ESR 29 -Blood cultures show no growth -ABI R 1.04, L 1.13 -MRI right foot: No evidence of osteomyelitis.  Small juxtacortical erosions of the first metatarsal head with first mild MTP joint synovial enhancement tendinosis, tenosynovitis of the mid to distal flexor hallux longus tendon.  Suspicious for gout or other inflammatory arthropathy.  Severe first MTP joint osteoarthritis.  Severe first MTP joint osteoarthritis and hallux valgus deformity -Placed on Rocephin and Flagyl (in February 2019, patient was noted to have MSSA growth) -discuss with ortho- agreed with colchicine and outpt follow up -Uric acid 7.2 -given colchicine 1.29m followed by 0.671mone hour later (if this is truly gout, patient may need long term proph with Allopurinol- to be discussed with PCP or ortho) -pain and erythema in RLE improving -Was on ceftriaxone and flagyl, transitioned to Augmentin -Follow up orthopedics   Diabetes mellitus, type II -Hemoglobin A1c 5.8 on 02/01/2018 -currently on no home medications  Essential hypertension -Continue metoprolol  Alcohol dependence -Continue CIWA protocol -no withdrawal symptoms  Elevated AST -Suspect secondary to alcoholism, 66 -repeat CMP in one week  Marijuana  abuse -Counseled  Obstructive sleep apnea -Continue CPAP nightly  Diarrhea -suspect secondary to antibiotics vs colchicine, will give imodium and start on probiotic -currently no leukocytosis and afebrile  Procedures: ABI  Consultations: Ortho, via phone  Discharge Exam: Vitals:   02/19/18 1943 02/20/18 0449  BP: (!) 148/80 (!) 148/87  Pulse: 70 69  Resp: 20 16  Temp: 97.7 F (36.5 C) 97.6 F (36.4 C)  SpO2: 91% 92%     General: Well developed, well nourished, NAD, appears stated age  HEENT: NCAT, mucous membranes moist.  Cardiovascular: S1 S2 auscultated, no rubs, murmurs or gallops. Regular rate and rhythm.  Respiratory: Clear to auscultation bilaterally with equal chest rise  Abdomen: Soft, obese, nontender, nondistended, + bowel sounds  Extremities: warm dry without cyanosis clubbing.  Venous stasis skin changes on lower extremity.  Dressings in place on B/L feet  Mild erythema on right lower extremity improving  Neuro: AAOx3, nonfocal  Psych: Normal affect and demeanor  Discharge Instructions Discharge Instructions    Discharge instructions   Complete by:  As directed    Patient will be discharged to home.  Patient will need to follow up with primary care provider within one week of discharge, repeat CMP.  Follow up with orthopedics in one week. Patient should continue medications as prescribed.  Patient should follow a heart healthy/carb modified diet.     Allergies as of 02/20/2018      Reactions   Claritin [loratadine]    SOB      Medication List    TAKE these medications   amoxicillin-clavulanate 875-125 MG tablet Commonly known as:  AUGMENTIN Take 1 tablet by mouth every 12 (twelve) hours for 7 days.   collagenase ointment Commonly known as:  SANTYL Apply topically daily. Cleanse wound to left dorsal foot and third toe with NS.  Apply Santyl to wound bed.  Cover with dry dressing and tape.  Right second toe dehiscence"  Cleanse all toes  with soap and water and pat dry.  Apply Xeroform gauze to suture line.  Cover with dry dressing.  CHange daily   DULoxetine 20 MG capsule Commonly known as:  CYMBALTA Take 1 capsule (20 mg total) by mouth daily.   folic acid 1 MG tablet Commonly known as:  FOLVITE Take 1 tablet (1 mg total) by mouth daily.   HYDROcodone-acetaminophen 10-325 MG tablet Commonly known as:  NORCO Take 1 tablet by mouth every 6 (six) hours as needed for severe pain. What changed:    when to take this  reasons to take this  additional instructions   loperamide 2 MG capsule Commonly known as:  IMODIUM Take 1 capsule (2 mg total) by mouth as needed for diarrhea or loose stools.   metoprolol tartrate 100 MG tablet Commonly known as:  LOPRESSOR Take 1 tablet (100 mg total) by mouth 2 (two) times daily.   multivitamin with minerals Tabs tablet Take 1 tablet by mouth daily.   naproxen 250 MG tablet Commonly known as:  NAPROSYN Take 250 mg by mouth 2 (two) times daily with a meal.   nutrition supplement (JUVEN) Pack Take 1 packet by mouth 2 (two) times daily between meals.   saccharomyces boulardii 250 MG capsule Commonly known as:  FLORASTOR Take 1 capsule (250 mg total) by mouth 2 (two) times daily.  STOOL SOFTENER 100 MG capsule Generic drug:  Docusate Sodium Take 100 mg by mouth 2 (two) times daily as needed for constipation.   thiamine 100 MG tablet Take 1 tablet (100 mg total) by mouth daily.      Allergies  Allergen Reactions  . Claritin [Loratadine]     SOB   Follow-up Information    Binnie Rail, MD. Schedule an appointment as soon as possible for a visit in 1 week(s).   Specialty:  Internal Medicine Why:  Hospital follow up Contact information: [place] [address] [phone]        Rod Can, MD. Schedule an appointment as soon as possible for a visit in 1 week(s).   Specialty:  Orthopedic Surgery Why:  Hospital follow up, hip pain Contact  information: [address] [place] [address] [phone]            The results of significant diagnostics from this hospitalization (including imaging, microbiology, ancillary and laboratory) are listed below for reference.    Significant Diagnostic Studies: Mri Right Foot With And Without Contrast  Result Date: 02/17/2018 CLINICAL DATA:  Bilateral lower extremity swelling. History of diabetes. Evaluate for osteomyelitis. EXAM: MRI OF THE RIGHT FOREFOOT WITHOUT AND WITH CONTRAST TECHNIQUE: Multiplanar, multisequence MR imaging of the right forefoot was performed before and after the administration of intravenous contrast. CONTRAST:  20m MULTIHANCE GADOBENATE DIMEGLUMINE 529 MG/ML IV SOLN COMPARISON:  Right foot x-rays from yesterday. FINDINGS: Bones/Joint/Cartilage No acute fracture or dislocation. Severe joint space narrowing and cartilage loss at the first MTP joint and first metatarsal sesamoidal joints. Small juxtacortical erosions of the first metatarsal head. Mild edema within the first metatarsal head. Prominent synovial enhancement of the first MTP joint. No joint effusion. Severe hallux valgus deformity. Prior amputation of the third toe to the level of the mid metatarsal. Chronic fracture deformity of the distal second metatarsal with secondary moderate osteoarthritis at the second MTP joint. Ligaments The toe collateral ligaments are intact. The Lisfranc ligament is intact. Muscles and Tendons Severe atrophy of the intrinsic muscles of the forefoot, consistent with diabetic muscle changes. Thickening and increased intermediate signal of the mid to distal flexor hallucis longus tendon with mild tendon sheath enhancement. Soft tissues Prominent soft tissue swelling over the dorsal foot without enhancement. No drainable fluid collection. IMPRESSION: 1. No evidence of osteomyelitis. 2. Small juxtacortical erosions of the first metatarsal head with mild first MTP joint  synovial enhancement and tendinosis and tenosynovitis of the mid to distal flexor hallucis longus tendon. Findings are nonspecific, but suspicious for gout or other inflammatory arthropathy. Infection is thought to be less likely given lack of joint effusion and surrounding soft tissue inflammatory changes. 3. Severe first MTP joint osteoarthritis and hallux valgus deformity. 4. Moderate nonenhancing soft tissue swelling over the dorsal foot, favored to reflect venous insufficiency. Electronically Signed   By: WTitus DubinM.D.   On: 02/17/2018 21:27   Dg Foot Complete Right  Result Date: 02/16/2018 CLINICAL DATA:  Toe surgery with open wound and bleeding, status post fall EXAM: RIGHT FOOT COMPLETE - 3+ VIEW COMPARISON:  11/16/2017 FINDINGS: Possible small intra-articular fracture lateral base of the first distal phalanx. Marked hallux valgus deformity at the first MTP with bony proliferative changes at the head of the first metatarsal and associated marked arthritis at the first MTP joint. Prior resection of the third digit at the level of the mid metatarsal. Chronic appearing deformity at the second proximal  phalanx and head of the metatarsal. Small erosion at the head of the fifth proximal phalanx but similar appearance compared to prior radiograph. Large plantar calcaneal spur. Diffuse soft tissue swelling. IMPRESSION: 1. Possible small intra-articular fracture lateral base of the first distal phalanx 2. Chronic appearing deformities of the second proximal phalanx and the distal metatarsal with evidence of prior third digit resection at the level of the mid metatarsal. 3. Marked hallux valgus deformity at the first MTP joint with associated marked arthritis. Electronically Signed   By: Donavan Foil M.D.   On: 02/16/2018 19:20    Microbiology: Recent Results (from the past 240 hour(s))  Blood culture (routine x 2)     Status: None (Preliminary result)   Collection Time: 02/16/18  8:01 PM  Result  Value Ref Range Status   Specimen Description BLOOD RIGHT ANTECUBITAL  Final   Special Requests   Final    BOTTLES DRAWN AEROBIC AND ANAEROBIC Blood Culture adequate volume   Culture   Final    NO GROWTH 3 DAYS Performed at [facility], [address] [place], [place] [address]    Report Status PENDING  Incomplete  Blood culture (routine x 2)     Status: None (Preliminary result)   Collection Time: 02/16/18 10:14 PM  Result Value Ref Range Status   Specimen Description   Final    BLOOD RIGHT HAND Performed at [facility] [address] [place], [place] [address]    Special Requests   Final    BOTTLES DRAWN AEROBIC AND ANAEROBIC Blood Culture adequate volume Performed at [facility] [address], [place], [place] [address]    Culture   Final    NO GROWTH 2 DAYS Performed at [facility] [address] [place], [place] [address]    Report Status PENDING  Incomplete     Labs: Basic Metabolic Panel: Recent Labs  Lab 02/16/18 1958 02/17/18 0304 02/20/18 0424  NA 140 138 137  K 4.1 3.9 3.6  CL 104 103 102  CO2 23 24 23   GLUCOSE 127* 123* 97  BUN 7 8 9   CREATININE 0.65 0.69 0.75  CALCIUM 9.8 9.0 9.1   Liver Function Tests: Recent Labs  Lab 02/16/18 1958  AST 66*  ALT 52  ALKPHOS 127*  BILITOT 0.8  PROT 7.5  ALBUMIN 3.4*   No results for input(s): LIPASE, AMYLASE in the last 168 hours. No results for input(s): AMMONIA in the last 168 hours. CBC: Recent Labs  Lab 02/16/18 1958 02/17/18 0304  WBC 6.1 5.7  NEUTROABS 4.2  --   HGB 13.1 12.5*  HCT 37.4* 36.8*  MCV 106.9* 107.9*  PLT 241 212   Cardiac Enzymes: No results for input(s): CKTOTAL, CKMB, CKMBINDEX, TROPONINI in the last 168 hours. BNP: BNP (last 3 results) No results for input(s): BNP in the last 8760 hours.  ProBNP (last 3 results) No results for input(s): PROBNP in the last 8760 hours.  CBG: Recent Labs  Lab 02/19/18 0738  02/19/18 1154 02/19/18 1709 02/19/18 2150 02/20/18 0729  GLUCAP 116* 110* 113* 108* 87       Signed:  Hazeline Charnley  [facility] 02/20/2018, 10:43 AM

## 2018-02-20 NOTE — Discharge Instructions (Signed)

## 2018-02-21 LAB — CULTURE, BLOOD (ROUTINE X 2)
CULTURE: NO GROWTH
Special Requests: ADEQUATE

## 2018-02-22 LAB — CULTURE, BLOOD (ROUTINE X 2)
CULTURE: NO GROWTH
Special Requests: ADEQUATE

## 2018-02-26 NOTE — Progress Notes (Signed)
Subjective:    Patient ID: [name], male    DOB: 04-14-1961, 57 y.o.   MRN: [id number]  HPI The patient is here for follow up.  Hospital 5/2-5/6.  He was admitted for wound dehiscence from remote foot surgery.  He hit the toe and it started bleeding.  He was on the edge of the bed fell asleep and hit the floor - he did not sleep that night because he is not on cpap and has OSA and had hip pain.   His legs had been swollen and red for a few days. The right leg was worse than the left leg. He denies fevers, but had chills.  He had foot surgery on 2/28 (second mallet toe correction and lateral sesamoidectomy) and it had been healing well.  He will being having a right THR for avascular necrosis, but needs the foot to heal first.    Lower extremity wound infection:  Initially thought to be cellulitis but likely chronic venous statis with dermatitis MRI of foot showed no osteo, but there was osteoarthritis, tenosynovitis and possible gout. Initially on rocephin, flagyl Given colchicine for possible gout - received 2 doses Pain and redness in RLE improved Transitioned to Augmentin, which he completed Since being home he has seen mild swelling in right lower leg, no erythema He is unable to see the wound and is unsure how it looks - 3 sutures were placed 10 days ago  Left third toe ulcer; He also has an ulcer on the third toe on his left toe -it looks better, but has not healed  Diabetes a1c 5.8 Diet controlled  Hypertension: Continued on metoprolol  Alcohol dependence: No complications/withdrawl symptoms  Elevated AST Possibly secondary to alcoholism Repeat cmp  OSA: Intolerant of cpap  Will refer to pulm   Diarrhea: Thought to be related to antibiotics and/ or colchicine No leukocytosis, afebrile Given imodium Started on a probiotic No diarrhea at home - starting to developing constipation  Leg edema: He is unsure why he has leg edema - it has gotten worse since  being home He is sedentary and has not elevated his legs    Medications and allergies reviewed with patient and updated if appropriate.  Patient Active Problem List   Diagnosis Date Noted  . Diabetic foot infection (HCC) 02/16/2018  . Alcohol dependence syndrome (HCC) 02/16/2018  . Marijuana abuse 02/16/2018  . Diabetic ulcer of right midfoot associated with type 2 diabetes mellitus, with necrosis of muscle (HCC) 11/16/2017  . Foot ulcer (HCC) 12/21/2016  . Bilateral carpal tunnel syndrome 12/15/2016  . Fatty liver 12/14/2016  . Hyperlipidemia 11/25/2016  . Elevated liver enzymes 11/25/2016  . Prediabetes 11/25/2016  . Hypertension 11/23/2016  . Neuropathy 11/23/2016  . Toe amputation status, right (HCC) 11/23/2016  . Morbid obesity (HCC) 11/23/2016  . OSA (obstructive sleep apnea) 11/23/2016    Current Outpatient Medications on File Prior to Visit  Medication Sig Dispense Refill  . collagenase (SANTYL) ointment Apply topically daily. Cleanse wound to left dorsal foot and third toe with NS.  Apply Santyl to wound bed.  Cover with dry dressing and tape.  Right second toe dehiscence"  Cleanse all toes with soap and water and pat dry.  Apply Xeroform gauze to suture line.  Cover with dry dressing.  CHange daily 15 g 0  . Docusate Sodium (STOOL SOFTENER) 100 MG capsule Take 100 mg by mouth 2 (two) times daily as needed for constipation.    . DULoxetine (  CYMBALTA) 20 MG capsule Take 1 capsule (20 mg total) by mouth daily. 30 capsule 3  . folic acid (FOLVITE) 1 MG tablet Take 1 tablet (1 mg total) by mouth daily. 30 tablet 0  . HYDROcodone-acetaminophen (NORCO) 10-325 MG tablet Take 1 tablet by mouth every 6 (six) hours as needed for severe pain. 20 tablet 0  . loperamide (IMODIUM) 2 MG capsule Take 1 capsule (2 mg total) by mouth as needed for diarrhea or loose stools. 30 capsule 0  . metoprolol (LOPRESSOR) 100 MG tablet Take 1 tablet (100 mg total) by mouth 2 (two) times daily. 180  tablet 3  . Multiple Vitamin (MULTIVITAMIN WITH MINERALS) TABS tablet Take 1 tablet by mouth daily. 30 tablet 0  . naproxen (NAPROSYN) 250 MG tablet Take 250 mg by mouth 2 (two) times daily with a meal.    . nutrition supplement, JUVEN, (JUVEN) PACK Take 1 packet by mouth 2 (two) times daily between meals. 60 packet 0  . saccharomyces boulardii (FLORASTOR) 250 MG capsule Take 1 capsule (250 mg total) by mouth 2 (two) times daily. 30 capsule 0  . thiamine 100 MG tablet Take 1 tablet (100 mg total) by mouth daily. 30 tablet 0   No current facility-administered medications on file prior to visit.     Past Medical History:  Diagnosis Date  . Arthritis   . Diabetes (HCC)    recently diagnosed, "borderline"  . Diverticulitis    portion of colon removed  . Hip pain   . History of ventral hernia repair 2016  . Hypertension     Past Surgical History:  Procedure Laterality Date  . HERNIA REPAIR  2016   ventral  . JOINT REPLACEMENT     b/l knees     Social History   Socioeconomic History  . Marital status: Divorced    Spouse name: Not on file  . Number of children: Not on file  . Years of education: Not on file  . Highest education level: Not on file  Occupational History  . Occupation: unemployed, Press photographer for disability  Social Needs  . Financial resource strain: Not on file  . Food insecurity:    Worry: Not on file    Inability: Not on file  . Transportation needs:    Medical: Not on file    Non-medical: Not on file  Tobacco Use  . Smoking status: Former Games developer  . Smokeless tobacco: Never Used  Substance and Sexual Activity  . Alcohol use: Yes    Comment: 2-3 a night,  the most ever 6+ in a night - he is down from 1/2 gallon every 2 days, now 1/2 gallon every 4 days  . Drug use: Yes    Frequency: 3.0 times per week    Types: Marijuana  . Sexual activity: Not on file  Lifestyle  . Physical activity:    Days per week: Not on file    Minutes per session: Not on file    . Stress: Not on file  Relationships  . Social connections:    Talks on phone: Not on file    Gets together: Not on file    Attends religious service: Not on file    Active member of club or organization: Not on file    Attends meetings of clubs or organizations: Not on file    Relationship status: Not on file  Other Topics Concern  . Not on file  Social History Narrative  . Not on file  Family History  Adopted: Yes  Family history unknown: Yes    Review of Systems  Constitutional: Negative for chills and fever.  Respiratory: Positive for cough (in am - allergies). Negative for shortness of breath and wheezing.   Cardiovascular: Positive for leg swelling (mild in ankles). Negative for chest pain and palpitations.  Gastrointestinal: Negative for abdominal pain, diarrhea and nausea.  Skin: Positive for wound. Negative for color change.  Neurological: Negative for light-headedness and headaches.       Objective:   Vitals:   02/27/18 1357  BP: (!) 144/90  Pulse: 69  Resp: 16  Temp: 97.9 F (36.6 C)  SpO2: 93%   BP Readings from Last 3 Encounters:  02/27/18 (!) 144/90  02/20/18 (!) 173/77  02/01/18 140/86   Wt Readings from Last 3 Encounters:  02/27/18 299 lb (135.6 kg)  02/17/18 (!) 324 lb 15.3 oz (147.4 kg)  02/01/18 298 lb (135.2 kg)   Body mass index is 39.45 kg/m.   Physical Exam    Constitutional: Appears well-developed and well-nourished. No distress.  HENT:  Head: Normocephalic and atraumatic.  Neck: Neck supple. No tracheal deviation present. No thyromegaly present.  No cervical lymphadenopathy Cardiovascular: Normal rate, regular rhythm and normal heart sounds.   No murmur heard. No carotid bruit .  2 + pitting edema b/l LE Pulmonary/Chest: Effort normal and breath sounds normal. No respiratory distress. No has no wheezes. No rales.  Skin: Skin is warm and dry. Not diaphoretic. No LE erythema.  Bluish discoloration in b/l feet R > L; right  second toe with opening - three sutures removed, no discharge - partially healed; b/l onychomycosis; distal left third toe ulcer - no active discharge Psychiatric: Normal mood and affect. Behavior is normal.      Assessment & Plan:    See Problem List for Assessment and Plan of chronic medical problems.

## 2018-02-27 ENCOUNTER — Encounter (HOSPITAL_BASED_OUTPATIENT_CLINIC_OR_DEPARTMENT_OTHER): Payer: BLUE CROSS/BLUE SHIELD

## 2018-02-27 ENCOUNTER — Ambulatory Visit: Payer: BLUE CROSS/BLUE SHIELD | Admitting: Internal Medicine

## 2018-02-27 ENCOUNTER — Encounter: Payer: Self-pay | Admitting: Internal Medicine

## 2018-02-27 VITALS — BP 144/90 | HR 69 | Temp 97.9°F | Resp 16 | Wt 299.0 lb

## 2018-02-27 DIAGNOSIS — R600 Localized edema: Secondary | ICD-10-CM | POA: Diagnosis not present

## 2018-02-27 DIAGNOSIS — L089 Local infection of the skin and subcutaneous tissue, unspecified: Secondary | ICD-10-CM

## 2018-02-27 DIAGNOSIS — L97519 Non-pressure chronic ulcer of other part of right foot with unspecified severity: Secondary | ICD-10-CM | POA: Diagnosis not present

## 2018-02-27 DIAGNOSIS — I10 Essential (primary) hypertension: Secondary | ICD-10-CM | POA: Diagnosis not present

## 2018-02-27 DIAGNOSIS — G4733 Obstructive sleep apnea (adult) (pediatric): Secondary | ICD-10-CM | POA: Diagnosis not present

## 2018-02-27 DIAGNOSIS — G629 Polyneuropathy, unspecified: Secondary | ICD-10-CM

## 2018-02-27 DIAGNOSIS — E11628 Type 2 diabetes mellitus with other skin complications: Secondary | ICD-10-CM

## 2018-02-27 DIAGNOSIS — R748 Abnormal levels of other serum enzymes: Secondary | ICD-10-CM

## 2018-02-27 MED ORDER — FUROSEMIDE 20 MG PO TABS: 20.0000 mg | ORAL_TABLET | Freq: Every day | ORAL | 3 refills | Status: DC

## 2018-02-27 NOTE — Assessment & Plan Note (Addendum)
Intolerant of cpap Not using anything Refer to pulmonary for options

## 2018-02-27 NOTE — Assessment & Plan Note (Signed)
Likely multifactorial Start lasix 20 mg daily F/u in one week Will check cmp then

## 2018-02-27 NOTE — Patient Instructions (Addendum)
   Medications reviewed and updated.  Lasix 20 mg daily for leg swelling.     A referral was ordered for pulmonary and podiatry.   Please followup in 1 week

## 2018-02-27 NOTE — Assessment & Plan Note (Signed)
B/l feet -  Right second plantar surface - sutures removed.  Not completed healed, no obvious infection Left third toe ulcer Refer to podiatry

## 2018-02-27 NOTE — Assessment & Plan Note (Signed)
Significant decreased sensation in b/l feet Referred to podiatry - needs routine care

## 2018-02-27 NOTE — Assessment & Plan Note (Addendum)
Possibly related to alcohol use, obesity cmp next week

## 2018-02-27 NOTE — Assessment & Plan Note (Signed)
Continue metoprolol Start lasix for leg edema F/u in one week

## 2018-02-27 NOTE — Assessment & Plan Note (Signed)
B/l feet -  Right second plantar surface - sutures removed.  Not completed healed, no obvious infection Left third toe ulcer Refer to podiatry  Follow up in one week

## 2018-03-06 ENCOUNTER — Telehealth: Payer: Self-pay | Admitting: Internal Medicine

## 2018-03-06 NOTE — Telephone Encounter (Signed)
Copied from CRM [id number]. Topic: Quick Communication - See Telephone Encounter >> Mar 06, 2018  1:06 PM Arlyss Gandy, NT wrote: CRM for notification. See Telephone encounter for: 03/06/18. Pt wants to see if he can have something called in for his bilateral hip pain that he has seen Dr. Lawerance Bach for? [id number] [place], [place] - [address] [phone] (Phone) [phone] (Fax). If not able to pt would like instructions on what he could do for the pain.

## 2018-03-06 NOTE — Telephone Encounter (Signed)
Spoke with pt to inform.  

## 2018-03-06 NOTE — Telephone Encounter (Signed)
He is seeing orthopedics for this and should ask them for pain medication.

## 2018-03-07 ENCOUNTER — Ambulatory Visit: Payer: BLUE CROSS/BLUE SHIELD | Admitting: Internal Medicine

## 2018-03-14 ENCOUNTER — Encounter: Payer: Self-pay | Admitting: Podiatry

## 2018-03-14 ENCOUNTER — Ambulatory Visit (INDEPENDENT_AMBULATORY_CARE_PROVIDER_SITE_OTHER): Payer: BLUE CROSS/BLUE SHIELD

## 2018-03-14 ENCOUNTER — Ambulatory Visit: Payer: BLUE CROSS/BLUE SHIELD | Admitting: Podiatry

## 2018-03-14 VITALS — BP 153/87 | HR 76 | Temp 98.1°F | Resp 18

## 2018-03-14 DIAGNOSIS — L97529 Non-pressure chronic ulcer of other part of left foot with unspecified severity: Secondary | ICD-10-CM | POA: Diagnosis not present

## 2018-03-14 DIAGNOSIS — M2041 Other hammer toe(s) (acquired), right foot: Secondary | ICD-10-CM | POA: Diagnosis not present

## 2018-03-14 DIAGNOSIS — L84 Corns and callosities: Secondary | ICD-10-CM | POA: Diagnosis not present

## 2018-03-14 DIAGNOSIS — M2042 Other hammer toe(s) (acquired), left foot: Secondary | ICD-10-CM | POA: Diagnosis not present

## 2018-03-14 DIAGNOSIS — G629 Polyneuropathy, unspecified: Secondary | ICD-10-CM | POA: Diagnosis not present

## 2018-03-14 MED ORDER — AMOXICILLIN-POT CLAVULANATE 875-125 MG PO TABS: 1.0000 | ORAL_TABLET | Freq: Two times a day (BID) | ORAL | 0 refills | Status: DC

## 2018-03-14 MED ORDER — MUPIROCIN 2 % EX OINT: 1.0000 "application " | TOPICAL_OINTMENT | Freq: Two times a day (BID) | CUTANEOUS | 2 refills | Status: DC

## 2018-03-14 NOTE — Progress Notes (Signed)
Subjective:    Patient ID: [name], male    DOB: 1961-02-15, 57 y.o.   MRN: [id number]  HPI  [name] presents to the office today for concerns of wounds to both of his feet. He has a history of seeing other doctors for wounds to his feet. Recently he was admitted to the hospital for a wound on the right foot. He states he also has a wound on the left foot which has been present for some time. He has noticed the toe on the left (third toe) has been swollen and red. He is currently not on any antibiotics. He does not relate any purulence from the ulcerations. He is trying to be scheduled for hip surgery but unfortunately he cannot do this until the wounds have healed. He states the wounds are not healing as quickly as he would like. He does get intermittent swelling to his legs. He is currently concerned on the wounds.  Review of Systems  All other systems reviewed and are negative.  Past Medical History:  Diagnosis Date  . Arthritis   . Diabetes (HCC)    recently diagnosed, "borderline"  . Diverticulitis    portion of colon removed  . Hip pain   . History of ventral hernia repair 2016  . Hypertension     Past Surgical History:  Procedure Laterality Date  . HERNIA REPAIR  2016   ventral  . JOINT REPLACEMENT     b/l knees      Current Outpatient Medications:  .  amoxicillin-clavulanate (AUGMENTIN) 875-125 MG tablet, Take 1 tablet by mouth 2 (two) times daily., Disp: 20 tablet, Rfl: 0 .  collagenase (SANTYL) ointment, Apply topically daily. Cleanse wound to left dorsal foot and third toe with NS.  Apply Santyl to wound bed.  Cover with dry dressing and tape.  Right second toe dehiscence"  Cleanse all toes with soap and water and pat dry.  Apply Xeroform gauze to suture line.  Cover with dry dressing.  CHange daily, Disp: 15 g, Rfl: 0 .  Docusate Sodium (STOOL SOFTENER) 100 MG capsule, Take 100 mg by mouth 2 (two) times daily as needed for constipation., Disp: , Rfl:  .   DULoxetine (CYMBALTA) 20 MG capsule, Take 1 capsule (20 mg total) by mouth daily., Disp: 30 capsule, Rfl: 3 .  folic acid (FOLVITE) 1 MG tablet, Take 1 tablet (1 mg total) by mouth daily., Disp: 30 tablet, Rfl: 0 .  furosemide (LASIX) 20 MG tablet, Take 1 tablet (20 mg total) by mouth daily., Disp: 30 tablet, Rfl: 3 .  loperamide (IMODIUM) 2 MG capsule, Take 1 capsule (2 mg total) by mouth as needed for diarrhea or loose stools., Disp: 30 capsule, Rfl: 0 .  metoprolol (LOPRESSOR) 100 MG tablet, Take 1 tablet (100 mg total) by mouth 2 (two) times daily., Disp: 180 tablet, Rfl: 3 .  Multiple Vitamin (MULTIVITAMIN WITH MINERALS) TABS tablet, Take 1 tablet by mouth daily., Disp: 30 tablet, Rfl: 0 .  mupirocin ointment (BACTROBAN) 2 %, Apply 1 application topically 2 (two) times daily., Disp: 30 g, Rfl: 2 .  naproxen (NAPROSYN) 250 MG tablet, Take 250 mg by mouth 2 (two) times daily with a meal., Disp: , Rfl:  .  nutrition supplement, JUVEN, (JUVEN) PACK, Take 1 packet by mouth 2 (two) times daily between meals., Disp: 60 packet, Rfl: 0 .  saccharomyces boulardii (FLORASTOR) 250 MG capsule, Take 1 capsule (250 mg total) by mouth 2 (two) times daily., Disp: 30  capsule, Rfl: 0 .  thiamine 100 MG tablet, Take 1 tablet (100 mg total) by mouth daily., Disp: 30 tablet, Rfl: 0 .  traMADol (ULTRAM) 50 MG tablet, tramadol 50 mg tablet  Take 1 tablet(s) EVERY 6 HOURS as needed for hip pain., Disp: , Rfl:   Allergies  Allergen Reactions  . Claritin [Loratadine]     SOB         Objective:   Physical Exam  General: AAO x3, NAD  Dermatological: On the right foot submetatarsal one is a hyperkeratotic lesion.  Upon debridement there is no underlying ulceration or any signs of infection at this area.  On the plantar aspect the right third toe on the sulcus of the PIPJ is a skin fissure with a granular wound base.  There is no probing to bone there is no exposed tendon.  There is minimal edema to the toe there  is minimal surrounding erythema but there is no ascending cellulitis.  There is no fluctuation or crepitation or any malodor.  The left third toe there is edema and erythema to the entire toe to the level of the MPJ but there is no ascending cellulitis.  No fluctuation or crepitation.  There is a granular wound base over the distal aspect of the toe without any probing, undermining or tunneling.  Vascular: Dorsalis Pedis artery and Posterior Tibial artery pedal pulses are 2/4 bilateral with immedate capillary fill time.  Chronic venous insufficiency is present bilaterally there is no pain with calf compression, swelling, warmth, erythema.   Neruologic: Sensation decreased with Dorann Ou monofilament.  Musculoskeletal: Hammertoe contractures present.  Muscular strength 5/5 in all groups tested bilateral.     Assessment & Plan:  57 year old male with bilateral ulcerations, left third toe cellulitis; hyperkeratotic lesion right foot -Treatment options discussed including all alternatives, risks, and complications -Etiology of symptoms were discussed -X-rays were obtained and reviewed with the patient.  X-rays were obtained of left foot.  No definitive evidence of acute fracture or stress fracture there is no evidence of osteomyelitis.  No soft tissue emphysema is present. -Arterial studies were performed in the hospital. -I debrided the hyperkeratotic lesion right foot without any complications or bleeding.  Submetatarsal 1 ulceration appears to be healed. -Ulceration continues to the plantar aspect of the right toe as well as the distal portion left third toe. Continue Santyl.  In regards to the left foot we do need to offload this morning to take the pressure off of it.  Surgical shoe with offloading pad was dispensed.  Given the cellulitis in the left third toe will restart Augmentin.  He has appoint with wound care center but unfortunately had to cancel this.  The wounds are healing are made  improvements I see him will refer back to the wound care center. -Monitor for any clinical signs or symptoms of infection and directed to call the office immediately should any occur or go to the ER. -RTC 1 week or sooner if needed  Vivi Barrack DPM

## 2018-03-14 NOTE — Progress Notes (Signed)
Subjective:    Patient ID: [name], male    DOB: May 13, 1961, 57 y.o.   MRN: [id number]  HPI The patient is here for follow up.       Hypertension: He is taking his medication daily. He is compliant with a low sodium diet.  He denies chest pain, palpitations and regular headaches. He is not exercising regularly.  He does not monitor his blood pressure at home.    Hyperlipidemia: He is not on any medication. He is compliant with a low fat/cholesterol diet. He is not exercising regularly. He denies myalgias.   Leg edema:  We started lasix 20 mg daily 2 weeks ago.  He is urinating a lot, but also drinks a lot.  He thinks the swelling may be a little better but he still has a lot of swelling.   Toe ulcers:  He saw podiatry.  He was placed on augmentin.  The left toe ulcer was debrided.  He has a follow up in one week. He is wearing foot boot to offset the pressure on his foot.  He denies fever/chills.   Medications and allergies reviewed with patient and updated if appropriate.  Patient Active Problem List   Diagnosis Date Noted  . Bilateral leg edema 02/27/2018  . Diabetic foot infection (HCC) 02/16/2018  . Alcohol dependence syndrome (HCC) 02/16/2018  . Marijuana abuse 02/16/2018  . Diabetic ulcer of right midfoot associated with type 2 diabetes mellitus, with necrosis of muscle (HCC) 11/16/2017  . Foot ulcer (HCC) 12/21/2016  . Bilateral carpal tunnel syndrome 12/15/2016  . Fatty liver 12/14/2016  . Hyperlipidemia 11/25/2016  . Elevated liver enzymes 11/25/2016  . Prediabetes 11/25/2016  . Hypertension 11/23/2016  . Neuropathy 11/23/2016  . Toe amputation status, right (HCC) 11/23/2016  . Morbid obesity (HCC) 11/23/2016  . OSA (obstructive sleep apnea) 11/23/2016  . Other hammer toe (acquired) 11/11/2013  . Exstrophy of bladder 11/11/2013    Current Outpatient Medications on File Prior to Visit  Medication Sig Dispense Refill  . amoxicillin-clavulanate (AUGMENTIN)  875-125 MG tablet Take 1 tablet by mouth 2 (two) times daily. 20 tablet 0  . collagenase (SANTYL) ointment Apply topically daily. Cleanse wound to left dorsal foot and third toe with NS.  Apply Santyl to wound bed.  Cover with dry dressing and tape.  Right second toe dehiscence"  Cleanse all toes with soap and water and pat dry.  Apply Xeroform gauze to suture line.  Cover with dry dressing.  CHange daily 15 g 0  . Docusate Sodium (STOOL SOFTENER) 100 MG capsule Take 100 mg by mouth 2 (two) times daily as needed for constipation.    . DULoxetine (CYMBALTA) 20 MG capsule Take 1 capsule (20 mg total) by mouth daily. 30 capsule 3  . folic acid (FOLVITE) 1 MG tablet Take 1 tablet (1 mg total) by mouth daily. 30 tablet 0  . furosemide (LASIX) 20 MG tablet Take 1 tablet (20 mg total) by mouth daily. 30 tablet 3  . loperamide (IMODIUM) 2 MG capsule Take 1 capsule (2 mg total) by mouth as needed for diarrhea or loose stools. 30 capsule 0  . metoprolol (LOPRESSOR) 100 MG tablet Take 1 tablet (100 mg total) by mouth 2 (two) times daily. 180 tablet 3  . Multiple Vitamin (MULTIVITAMIN WITH MINERALS) TABS tablet Take 1 tablet by mouth daily. 30 tablet 0  . mupirocin ointment (BACTROBAN) 2 % Apply 1 application topically 2 (two) times daily. 30 g 2  .  naproxen (NAPROSYN) 250 MG tablet Take 250 mg by mouth 2 (two) times daily with a meal.    . nutrition supplement, JUVEN, (JUVEN) PACK Take 1 packet by mouth 2 (two) times daily between meals. 60 packet 0  . saccharomyces boulardii (FLORASTOR) 250 MG capsule Take 1 capsule (250 mg total) by mouth 2 (two) times daily. 30 capsule 0  . thiamine 100 MG tablet Take 1 tablet (100 mg total) by mouth daily. 30 tablet 0  . traMADol (ULTRAM) 50 MG tablet tramadol 50 mg tablet  Take 1 tablet(s) EVERY 6 HOURS as needed for hip pain.     No current facility-administered medications on file prior to visit.     Past Medical History:  Diagnosis Date  . Arthritis   . Diabetes  (HCC)    recently diagnosed, "borderline"  . Diverticulitis    portion of colon removed  . Hip pain   . History of ventral hernia repair 2016  . Hypertension     Past Surgical History:  Procedure Laterality Date  . HERNIA REPAIR  2016   ventral  . JOINT REPLACEMENT     b/l knees     Social History   Socioeconomic History  . Marital status: Divorced    Spouse name: Not on file  . Number of children: Not on file  . Years of education: Not on file  . Highest education level: Not on file  Occupational History  . Occupation: unemployed, Press photographer for disability  Social Needs  . Financial resource strain: Not on file  . Food insecurity:    Worry: Not on file    Inability: Not on file  . Transportation needs:    Medical: Not on file    Non-medical: Not on file  Tobacco Use  . Smoking status: Former Games developer  . Smokeless tobacco: Never Used  Substance and Sexual Activity  . Alcohol use: Yes    Comment: 2-3 a night,  the most ever 6+ in a night - he is down from 1/2 gallon every 2 days, now 1/2 gallon every 4 days  . Drug use: Yes    Frequency: 3.0 times per week    Types: Marijuana  . Sexual activity: Not on file  Lifestyle  . Physical activity:    Days per week: Not on file    Minutes per session: Not on file  . Stress: Not on file  Relationships  . Social connections:    Talks on phone: Not on file    Gets together: Not on file    Attends religious service: Not on file    Active member of club or organization: Not on file    Attends meetings of clubs or organizations: Not on file    Relationship status: Not on file  Other Topics Concern  . Not on file  Social History Narrative  . Not on file    Family History  Adopted: Yes  Family history unknown: Yes    Review of Systems  Constitutional: Negative for chills and fever.  Respiratory: Positive for shortness of breath (sometimes with exertion). Negative for cough and wheezing.   Cardiovascular: Positive for  leg swelling. Negative for chest pain and palpitations.  Musculoskeletal: Positive for arthralgias.  Neurological: Negative for light-headedness and headaches.       Objective:   Vitals:   03/15/18 1519  BP: (!) 140/92  Pulse: 73  Resp: 16  Temp: 98.1 F (36.7 C)  SpO2: 97%   BP Readings from  Last 3 Encounters:  03/15/18 (!) 140/92  03/14/18 (!) 153/87  02/27/18 (!) 144/90   Wt Readings from Last 3 Encounters:  02/27/18 299 lb (135.6 kg)  02/17/18 (!) 324 lb 15.3 oz (147.4 kg)  02/01/18 298 lb (135.2 kg)   There is no height or weight on file to calculate BMI.   Physical Exam    Constitutional: Appears well-developed and well-nourished. No distress.  HENT:  Head: Normocephalic and atraumatic.  Neck: Neck supple. No tracheal deviation present. No thyromegaly present.  No cervical lymphadenopathy Cardiovascular: Normal rate, regular rhythm and normal heart sounds.   No murmur heard. No carotid bruit .  1 + b/l LE edema Pulmonary/Chest: Effort normal and breath sounds normal. No respiratory distress. No has no wheezes. No rales.  Skin: Skin is warm and dry. Not diaphoretic.  Psychiatric: Normal mood and affect. Behavior is normal.      Assessment & Plan:    See Problem List for Assessment and Plan of chronic medical problems.

## 2018-03-14 NOTE — Patient Instructions (Addendum)
  Test(s) ordered today. Your results will be released to MyChart (or called to you) after review, usually within 72hours after test completion. If any changes need to be made, you will be notified at that same time.   Medications reviewed and updated.  Changes include increasing lasix to 40 mg daily.  Your prescription(s) have been submitted to your pharmacy. Please take as directed and contact our office if you believe you are having problem(s) with the medication(s).   Please followup in 4 months

## 2018-03-15 ENCOUNTER — Encounter: Payer: Self-pay | Admitting: Internal Medicine

## 2018-03-15 ENCOUNTER — Other Ambulatory Visit (INDEPENDENT_AMBULATORY_CARE_PROVIDER_SITE_OTHER): Payer: BLUE CROSS/BLUE SHIELD

## 2018-03-15 ENCOUNTER — Ambulatory Visit: Payer: BLUE CROSS/BLUE SHIELD | Admitting: Internal Medicine

## 2018-03-15 VITALS — BP 140/92 | HR 73 | Temp 98.1°F | Resp 16

## 2018-03-15 DIAGNOSIS — I10 Essential (primary) hypertension: Secondary | ICD-10-CM | POA: Diagnosis not present

## 2018-03-15 DIAGNOSIS — E782 Mixed hyperlipidemia: Secondary | ICD-10-CM | POA: Diagnosis not present

## 2018-03-15 DIAGNOSIS — R600 Localized edema: Secondary | ICD-10-CM

## 2018-03-15 DIAGNOSIS — R7303 Prediabetes: Secondary | ICD-10-CM

## 2018-03-15 LAB — COMPREHENSIVE METABOLIC PANEL
ALBUMIN: 4 g/dL (ref 3.5–5.2)
ALK PHOS: 108 U/L (ref 39–117)
ALT: 36 U/L (ref 0–53)
AST: 34 U/L (ref 0–37)
BUN: 15 mg/dL (ref 6–23)
CALCIUM: 10.2 mg/dL (ref 8.4–10.5)
CHLORIDE: 98 meq/L (ref 96–112)
CO2: 27 mEq/L (ref 19–32)
CREATININE: 0.7 mg/dL (ref 0.40–1.50)
GFR: 123.7 mL/min (ref >60.00)
Glucose, Bld: 148 mg/dL — ABNORMAL HIGH (ref 70–99)
POTASSIUM: 3.7 meq/L (ref 3.5–5.1)
SODIUM: 137 meq/L (ref 135–145)
Total Bilirubin: 1.2 mg/dL (ref 0.2–1.2)
Total Protein: 8 g/dL (ref 6.0–8.3)

## 2018-03-15 LAB — HEMOGLOBIN A1C: HEMOGLOBIN A1C: 5.6 % (ref 4.6–6.5)

## 2018-03-15 MED ORDER — FUROSEMIDE 20 MG PO TABS: 40.0000 mg | ORAL_TABLET | Freq: Every day | ORAL | 3 refills | Status: DC

## 2018-03-15 NOTE — Assessment & Plan Note (Signed)
Check lipid panel  Need to consider statin Regular exercise and healthy diet encouraged

## 2018-03-15 NOTE — Assessment & Plan Note (Addendum)
Still with significant edema - improved Legs currently wrapped which is helping He is elevating his legs when able Low sodium diet Will increase lasix to 40 mg daily cmp today Discussed getting an echo - he would like to hold off for now

## 2018-03-15 NOTE — Assessment & Plan Note (Signed)
Sugars in prediabetic range Low sugar/carb diet Unable to exercise Ideally lose weight - but that probably will not happen until after hip surgery Check a1c

## 2018-03-15 NOTE — Assessment & Plan Note (Signed)
Not ideally controlled Will be increasing lasix If still elevated may need to add low dose ACE-I or ARB cmp

## 2018-03-16 ENCOUNTER — Encounter: Payer: Self-pay | Admitting: Internal Medicine

## 2018-03-20 ENCOUNTER — Other Ambulatory Visit: Payer: Self-pay | Admitting: Internal Medicine

## 2018-03-23 ENCOUNTER — Ambulatory Visit: Payer: BLUE CROSS/BLUE SHIELD | Admitting: Podiatry

## 2018-03-23 DIAGNOSIS — L97529 Non-pressure chronic ulcer of other part of left foot with unspecified severity: Secondary | ICD-10-CM | POA: Diagnosis not present

## 2018-03-23 DIAGNOSIS — L97511 Non-pressure chronic ulcer of other part of right foot limited to breakdown of skin: Secondary | ICD-10-CM | POA: Diagnosis not present

## 2018-03-23 MED ORDER — AMOXICILLIN-POT CLAVULANATE 875-125 MG PO TABS: 1.0000 | ORAL_TABLET | Freq: Two times a day (BID) | ORAL | 0 refills | Status: DC

## 2018-03-27 NOTE — Progress Notes (Signed)
Subjective: 57 year old male presents the office today for follow-up evaluation of wounds to both of his feet.  He is still on the Augmentin.  He did not start the medication for a day or so after last saw him.  He has been putting Santyl ointment on the wounds daily.  He has been trying to offload the areas much as possible.  Denies any increase in redness, drainage or swelling redness of the feet looks better. Denies any systemic complaints such as fevers, chills, nausea, vomiting. No acute changes since last appointment, and no other complaints at this time.   Objective: AAO x3, NAD-presents today with his wife. DP/PT pulses palpable bilaterally, CRT less than 3 seconds Protective sensation decreased with Simms Weinstein monofilament On the right foot the sulcus of the third toe the fissure appears to be almost completely healed at this time there is no edema, erythema, drainage or pus there is no clinical signs of infection. The distal portion of the left third toe continues to be a granular with small amount of fibrotic tissue ulceration but appears to be more superficial and almost starting to have a scab overlying the area.  There is decrease edema and erythema to the toe.  There is no fluctuation or crepitation is no malodor.  No ascending cellulitis. No other open lesions or pre-ulcerative lesions.  No pain with calf compression, swelling, warmth, erythema  Assessment: Resolving infection left third toe with healing ulcerations bilaterally  Plan: -All treatment options discussed with the patient including all alternatives, risks, complications.  -Although the infection appears to be improving, continue antibiotics.  Refilled Augmentin today.  Continue with daily dressing changes as well as offloading at all times.  The right foot ulceration patient was completely healed the left side is doing better.  We will continue to monitor closely. -Monitor for any clinical signs or symptoms of  infection and directed to call the office immediately should any occur or go to the ER. -Patient encouraged to call the office with any questions, concerns, change in symptoms.   [name] DPM

## 2018-04-02 ENCOUNTER — Emergency Department (HOSPITAL_COMMUNITY)
Admission: EM | Admit: 2018-04-02 | Discharge: 2018-04-02 | Disposition: A | Discharge status: Home / Self Care | Payer: BLUE CROSS/BLUE SHIELD | Attending: Emergency Medicine | Admitting: Emergency Medicine

## 2018-04-02 ENCOUNTER — Other Ambulatory Visit: Payer: Self-pay

## 2018-04-02 ENCOUNTER — Encounter (HOSPITAL_COMMUNITY): Payer: Self-pay | Admitting: Emergency Medicine

## 2018-04-02 DIAGNOSIS — W109XXA Fall (on) (from) unspecified stairs and steps, initial encounter: Secondary | ICD-10-CM | POA: Insufficient documentation

## 2018-04-02 DIAGNOSIS — W19XXXA Unspecified fall, initial encounter: Secondary | ICD-10-CM | POA: Diagnosis not present

## 2018-04-02 DIAGNOSIS — S0121XA Laceration without foreign body of nose, initial encounter: Secondary | ICD-10-CM | POA: Diagnosis not present

## 2018-04-02 DIAGNOSIS — F1210 Cannabis abuse, uncomplicated: Secondary | ICD-10-CM | POA: Insufficient documentation

## 2018-04-02 DIAGNOSIS — Y999 Unspecified external cause status: Secondary | ICD-10-CM | POA: Diagnosis not present

## 2018-04-02 DIAGNOSIS — Y9301 Activity, walking, marching and hiking: Secondary | ICD-10-CM | POA: Diagnosis not present

## 2018-04-02 DIAGNOSIS — M25552 Pain in left hip: Secondary | ICD-10-CM | POA: Insufficient documentation

## 2018-04-02 DIAGNOSIS — I10 Essential (primary) hypertension: Secondary | ICD-10-CM | POA: Diagnosis not present

## 2018-04-02 DIAGNOSIS — Z79899 Other long term (current) drug therapy: Secondary | ICD-10-CM | POA: Diagnosis not present

## 2018-04-02 DIAGNOSIS — M25551 Pain in right hip: Secondary | ICD-10-CM | POA: Diagnosis not present

## 2018-04-02 DIAGNOSIS — R58 Hemorrhage, not elsewhere classified: Secondary | ICD-10-CM | POA: Diagnosis not present

## 2018-04-02 DIAGNOSIS — Y92009 Unspecified place in unspecified non-institutional (private) residence as the place of occurrence of the external cause: Secondary | ICD-10-CM | POA: Diagnosis not present

## 2018-04-02 MED ORDER — LIDOCAINE-EPINEPHRINE (PF) 2 %-1:200000 IJ SOLN
INTRAMUSCULAR | Status: AC
  Administered 2018-04-02: 20 mL
  Filled 2018-04-02: qty 20

## 2018-04-02 MED ORDER — LIDOCAINE-EPINEPHRINE 1 %-1:100000 IJ SOLN
20.0000 mL | Freq: Once | INTRAMUSCULAR | Status: DC
  Filled 2018-04-02: qty 20

## 2018-04-02 MED ORDER — LIDOCAINE-EPINEPHRINE 2 %-1:100000 IJ SOLN
20.0000 mL | Freq: Once | INTRAMUSCULAR | Status: DC
  Filled 2018-04-02: qty 20

## 2018-04-02 NOTE — ED Triage Notes (Addendum)
1.5cm laceration on bridge of nose. Pt stated that he fell backward into the glass door. He was walking down the stairs at his front door and missed the second step. The back of his head struck the glass panel. Glass broke and struck his face, cutting there bridge of his nose. . Pt denies LOC. No swelling or injury to back of head. Pt is alert, oriented and cooperative. Wife at bedside. Pt reports bilateral hip pain.Currently has ulcers on his feet. Is concerned about possible injury to feet or hips due to neuropathy.Must use assistive devises to walk at all time. Pt stated that he has a shot of liquor this morning

## 2018-04-02 NOTE — ED Provider Notes (Signed)
[facility]-EMERGENCY DEPT Provider Note   CSN: [id number] Arrival date & time: 04/02/18  1233     History   Chief Complaint Chief Complaint  Patient presents with  . Fall  . Laceration    HPI Jaleil Renwick is a 57 y.o. male.  57 yo M with a chief complaint of a fall.  The patient has bilateral foot ulcers that are thought to be diabetic.  He has been walking with a walker in size but uses crutches outside because he can get his walker out the door.  As he was going down the front steps he lost his balance and fell to the ground.  Struck the back of his head onto the glass part of the door.  Denies loss of consciousness denies vomiting denies confusion denies alcohol use.  Is a lack to the front of his nose.  Has some worsening pain some bilateral hips.  Struck his tailbone.  The history is provided by the patient.  Fall  This is a new problem. The current episode started 2 days ago. The problem occurs constantly. The problem has not changed since onset.Pertinent negatives include no chest pain, no abdominal pain, no headaches and no shortness of breath. Nothing aggravates the symptoms. Nothing relieves the symptoms. He has tried nothing for the symptoms. The treatment provided no relief.  Laceration      Past Medical History:  Diagnosis Date  . Arthritis   . Diabetes (HCC)    recently diagnosed, "borderline"  . Diverticulitis    portion of colon removed  . Hip pain   . History of ventral hernia repair 2016  . Hypertension     Patient Active Problem List   Diagnosis Date Noted  . Bilateral leg edema 02/27/2018  . Diabetic foot infection (HCC) 02/16/2018  . Alcohol dependence syndrome (HCC) 02/16/2018  . Marijuana abuse 02/16/2018  . Diabetic ulcer of right midfoot associated with type 2 diabetes mellitus, with necrosis of muscle (HCC) 11/16/2017  . Foot ulcer (HCC) 12/21/2016  . Bilateral carpal tunnel syndrome 12/15/2016  . Fatty liver 12/14/2016    . Hyperlipidemia 11/25/2016  . Elevated liver enzymes 11/25/2016  . Prediabetes 11/25/2016  . Hypertension 11/23/2016  . Neuropathy 11/23/2016  . Toe amputation status, right (HCC) 11/23/2016  . Morbid obesity (HCC) 11/23/2016  . OSA (obstructive sleep apnea) 11/23/2016  . Other hammer toe (acquired) 11/11/2013  . Exstrophy of bladder 11/11/2013    Past Surgical History:  Procedure Laterality Date  . HERNIA REPAIR  2016   ventral  . JOINT REPLACEMENT     b/l knees         Home Medications    Prior to Admission medications   Medication Sig Start Date End Date Taking? Authorizing Provider  amoxicillin-clavulanate (AUGMENTIN) 875-125 MG tablet Take 1 tablet by mouth 2 (two) times daily. 03/23/18  Yes Vivi Barrack, DPM  collagenase (SANTYL) ointment Apply topically daily. Cleanse wound to left dorsal foot and third toe with NS.  Apply Santyl to wound bed.  Cover with dry dressing and tape.  Right second toe dehiscence"  Cleanse all toes with soap and water and pat dry.  Apply Xeroform gauze to suture line.  Cover with dry dressing.  CHange daily 02/20/18  Yes Mikhail, [place], DO  DULoxetine (CYMBALTA) 20 MG capsule Take 1 capsule (20 mg total) by mouth daily. 12/08/17  Yes Tarry Kos, MD  folic acid (FOLVITE) 1 MG tablet Take 1 tablet (1 mg total)  by mouth daily. 02/20/18  Yes Mikhail, Maryann, DO  furosemide (LASIX) 20 MG tablet Take 2 tablets (40 mg total) by mouth daily. 03/15/18  Yes Burns, Bobette MoStacy J, MD  metoprolol tartrate (LOPRESSOR) 100 MG tablet TAKE 1 TABLET (100 MG TOTAL) BY MOUTH 2 (TWO) TIMES DAILY. 03/20/18  Yes Burns, Bobette MoStacy J, MD  Multiple Vitamin (MULTIVITAMIN WITH MINERALS) TABS tablet Take 1 tablet by mouth daily. 02/20/18  Yes Mikhail, Maryann, DO  naproxen (NAPROSYN) 250 MG tablet Take 250 mg by mouth 2 (two) times daily with a meal.   Yes [provider]  nutrition supplement, JUVEN, (JUVEN) PACK Take 1 packet by mouth 2 (two) times daily between meals.  02/20/18  Yes Mikhail, [place], DO  saccharomyces boulardii (FLORASTOR) 250 MG capsule Take 1 capsule (250 mg total) by mouth 2 (two) times daily. 02/20/18  Yes Mikhail, [place], DO  thiamine 100 MG tablet Take 1 tablet (100 mg total) by mouth daily. 02/20/18  Yes Mikhail, Nita SellsMaryann, DO  traMADol (ULTRAM) 50 MG tablet tramadol 50 mg tablet  Take 1 tablet(s) EVERY 6 HOURS as needed for hip pain.   Yes [provider]  Docusate Sodium (STOOL SOFTENER) 100 MG capsule Take 100 mg by mouth 2 (two) times daily as needed for constipation.    [provider]  loperamide (IMODIUM) 2 MG capsule Take 1 capsule (2 mg total) by mouth as needed for diarrhea or loose stools. 02/20/18   Mikhail, Nita SellsMaryann, DO  mupirocin ointment (BACTROBAN) 2 % Apply 1 application topically 2 (two) times daily. Patient not taking: Reported on 04/02/2018 03/14/18   Vivi BarrackWagoner, Matthew R, DPM    Family History Family History  Adopted: Yes  Family history unknown: Yes    Social History Social History   Tobacco Use  . Smoking status: Former Games developermoker  . Smokeless tobacco: Never Used  Substance Use Topics  . Alcohol use: Yes    Comment: 1-2 a night  . Drug use: Yes    Frequency: 3.0 times per week    Types: Marijuana     Allergies   Claritin [loratadine]   Review of Systems Review of Systems  Constitutional: Negative for chills and fever.  HENT: Negative for congestion and facial swelling.   Eyes: Negative for discharge and visual disturbance.  Respiratory: Negative for shortness of breath.   Cardiovascular: Negative for chest pain and palpitations.  Gastrointestinal: Negative for abdominal pain, diarrhea and vomiting.  Musculoskeletal: Positive for arthralgias and myalgias.  Skin: Positive for wound. Negative for color change and rash.  Neurological: Negative for tremors, syncope and headaches.  Psychiatric/Behavioral: Negative for confusion and dysphoric mood.     Physical Exam Updated Vital Signs BP  131/77 (BP Location: Left Arm)   Pulse 72   Temp 97.9 F (36.6 C) (Oral)   Resp 20   Ht 6\' 1"  (1.854 m)   Wt 131.5 kg (290 lb)   SpO2 98%   BMI 38.26 kg/m   Physical Exam  Constitutional: He is oriented to person, place, and time. He appears well-developed and well-nourished.  HENT:  Head: Normocephalic.  Lac to bridge of nose with capillary bleeding.  No noted blood to the naris bilaterally.    Eyes: Pupils are equal, round, and reactive to light. EOM are normal.  Neck: Normal range of motion. Neck supple. No JVD present.  Cardiovascular: Normal rate and regular rhythm. Exam reveals no gallop and no friction rub.  No murmur heard. Pulmonary/Chest: No respiratory distress. He has no wheezes.  Abdominal: He exhibits no distension and no mass. There is no tenderness. There is no rebound and no guarding.  Musculoskeletal: Normal range of motion.  Neurological: He is alert and oriented to person, place, and time.  Skin: No rash noted. No pallor.  Psychiatric: He has a normal mood and affect. His behavior is normal.  Nursing note and vitals reviewed.    ED Treatments / Results  Labs (all labs ordered are listed, but only abnormal results are displayed) Labs Reviewed - No data to display  EKG None  Radiology No results found.  Procedures .Marland KitchenLaceration Repair Date/Time: 04/02/2018 3:25 PM Performed by: Melene Plan, DO Authorized by: Melene Plan, DO   Consent:    Consent obtained:  Verbal   Consent given by:  Patient and parent   Risks discussed:  Pain, poor cosmetic result and poor wound healing   Alternatives discussed:  No treatment, delayed treatment and observation Anesthesia (see MAR for exact dosages):    Anesthesia method:  Local infiltration   Local anesthetic:  Lidocaine 2% WITH epi Laceration details:    Location:  Face   Face location:  Nose   Length (cm):  2 Repair type:    Repair type:  Complex Pre-procedure details:    Preparation:  Patient was prepped  and draped in usual sterile fashion Exploration:    Limited defect created (wound extended): yes     Hemostasis achieved with:  Epinephrine and direct pressure   Wound exploration: entire depth of wound probed and visualized     Contaminated: no   Treatment:    Area cleansed with:  Saline   Amount of cleaning:  Standard   Irrigation solution:  Sterile saline   Irrigation volume:  5   Irrigation method:  Syringe   Visualized foreign bodies/material removed: no     Debridement:  None   Undermining:  Minimal   Scar revision: no   Skin repair:    Repair method:  Sutures   Suture size:  5-0   Suture material:  Fast-absorbing gut   Suture technique:  Simple interrupted   Number of sutures:  3 Approximation:    Approximation:  Close Post-procedure details:    Dressing:  Open (no dressing)   Patient tolerance of procedure:  Tolerated well, no immediate complications   (including critical care time)  Medications Ordered in ED Medications  lidocaine-EPINEPHrine (XYLOCAINE W/EPI) 2 %-1:100000 (with pres) injection 20 mL (has no administration in time range)     Initial Impression / Assessment and Plan / ED Course  I have reviewed the triage vital signs and the nursing notes.  Pertinent labs & imaging results that were available during my care of the patient were reviewed by me and considered in my medical decision making (see chart for details).     57 yo M with a chief complaint of a fall.  Patient lost his balance.  [place] head CT negative.  He does have a laceration to the bridge of the nose which I repaired at bedside.  Complaining of worsening pain to bilateral hips but no significant tenderness on my exam.  Offered imaging which he is declined.  Tetanus last updated February of last year.  Sutured.  D/c home.   3:24 PM:  I have discussed the diagnosis/risks/treatment options with the patient and family and believe the pt to be eligible for discharge home to follow-up with  PCP. We also discussed returning to the ED immediately if new or worsening sx occur.  We discussed the sx which are most concerning (e.g., sudden worsening pain, fever, inability to tolerate by mouth) that necessitate immediate return. Medications administered to the patient during their visit and any new prescriptions provided to the patient are listed below.  Medications given during this visit Medications  lidocaine-EPINEPHrine (XYLOCAINE W/EPI) 2 %-1:100000 (with pres) injection 20 mL ( Infiltration Canceled Entry 04/02/18 1447)  lidocaine-EPINEPHrine (XYLOCAINE W/EPI) 2 %-1:200000 (PF) injection (20 mLs  Given 04/02/18 1459)      The patient appears reasonably screen and/or stabilized for discharge and I doubt any other medical condition or other [facility] requiring further screening, evaluation, or treatment in the ED at this time prior to discharge.    Final Clinical Impressions(s) / ED Diagnoses   Final diagnoses:  None    ED Discharge Orders    None       Melene Plan, DO 04/02/18 1526

## 2018-04-02 NOTE — ED Provider Notes (Signed)
MSE was initiated and I personally evaluated the patient and placed orders (if any) at  1:34 PM on April 02, 2018. [name] is a 57 y.o. obese male with hx of arthritis, DM, Diverticulitis, Bilateral hip pain with plans for bilateral hip replacement after ulcers on feet heal and HTN  who presents to the ED via EMS with multiple contusions and laceration. Pt staes that he fell backward into a glass door. He was walking down the stairs at his front door and missed the second step. The back of his head struck the glass panel. The glass broke and fell on his face, cutting there bridge of his nose. . Pt denies LOC. No swelling or injury to back of head. Pt is alert, oriented and cooperative. Wife at bedside. Pt reports bilateral hip pain that has worsened with the fall, bilateral foot pain after his feet got caught and turned under him during the fall. Is concerned about possible injury to feet, hips hips and nose. Patient has neuropathy and has difficulty knowing when his feet are badly injured. Patient reports that he must use assistive devises to walk at all time.  ROS: skin: laceration to bridge of nose  M/S: bilateral hip pain, bilateral foot pain  Physical Exam:  BP 131/77 (BP Location: Left Arm)   Pulse 72   Temp 97.9 F (36.6 C) (Oral)   Resp 20   Ht 6\' 1"  (1.854 m)   Wt 131.5 kg (290 lb)   SpO2 98%   BMI 38.26 kg/m    Gen: obese w/m No distress, appears uncomfortable   Neuro: Awake and Alert  Skin: laceration nose that is bleeding   HENT: PERL, good occular movement,facial laceration, TM's normal, no dental injury, throat normal. Nasal mucosa right side swollen and erythema noted, small amount of blood right nares.   M/S: patient unable to stand due to pain bilateral hips and feet.     Will move patient to Acute Room where he can be undressed and evaluated for multiple complaints s/p fall.   Initiation of care has begun. The patient has been counseled on the process, plan, and  necessity for staying for the completion/evaluation, and the remainder of the medical screening examination    The patient appears stable so that the remainder of the MSE may be completed by another provider.   [name], [name], [place]NP 04/02/18 1348    [name], Anthony, MD 04/03/18 [phone]

## 2018-04-02 NOTE — ED Triage Notes (Signed)
Per [facility] pt from home where had mechanical fall into glass door that broke and has laceration to nose. Vitals: 172/90, 64HR, CBg 115

## 2018-04-02 NOTE — Discharge Instructions (Signed)
Return for redness, drainage fever.  Return for vomiting, confusion, one sided weakness.  Otherwise follow up with your PCP in about a week for recheck.

## 2018-04-06 ENCOUNTER — Ambulatory Visit: Payer: BLUE CROSS/BLUE SHIELD | Admitting: Podiatry

## 2018-04-06 ENCOUNTER — Encounter: Payer: Self-pay | Admitting: Podiatry

## 2018-04-06 DIAGNOSIS — M2041 Other hammer toe(s) (acquired), right foot: Secondary | ICD-10-CM

## 2018-04-06 DIAGNOSIS — L03032 Cellulitis of left toe: Secondary | ICD-10-CM

## 2018-04-06 DIAGNOSIS — M2042 Other hammer toe(s) (acquired), left foot: Secondary | ICD-10-CM

## 2018-04-06 DIAGNOSIS — G629 Polyneuropathy, unspecified: Secondary | ICD-10-CM

## 2018-04-06 DIAGNOSIS — L97529 Non-pressure chronic ulcer of other part of left foot with unspecified severity: Secondary | ICD-10-CM

## 2018-04-06 MED ORDER — CLINDAMYCIN HCL 300 MG PO CAPS: 300.0000 mg | ORAL_CAPSULE | Freq: Three times a day (TID) | ORAL | 0 refills | Status: DC

## 2018-04-07 MED ORDER — MEDIHONEY WOUND/BURN DRESSING EX GEL: 1.0000 "application " | Freq: Every day | CUTANEOUS | 0 refills | Status: DC

## 2018-04-07 NOTE — Progress Notes (Signed)
Subjective: 57 year old male presents the office today for follow-up evaluation of wounds to both of his feet.  He states he is scheduled to finish Augmentin tomorrow.  He is eager to be cleared in order to get his hip surgery.  The right foot is done well and his wife states that the area is healed.  Still has a wound on the left foot on the third toe but his wife thinks that the wound is looking much better.  Did not change the bandage in 2 days.  Still some swelling or redness to the distal portion of the toes of the report.  Denies any drainage or pus.  No red streaks.  He denies any fevers, chills, nausea, vomiting.  Denies any calf pain, chest pain, shortness of breath.  Objective: AAO x3, NAD-presents today with his wife. DP/PT pulses palpable bilaterally, CRT less than 3 seconds Protective sensation decreased with Simms Weinstein monofilament On the right foot the sulcus of the third toe the fissure which appears to be healed.  There is no drainage or pus there is no edema, erythema, increase in warmth. On the left third toe distally is a hyperkeratotic lesion with a central granular wound.  After debridement today the wound measures 0.5 x 0.  3 cm.  Is superficial and starting to scab over.  There is no probing, undermining or tunneling.  There is still some edema erythema to the distal portion of the toe.  There is no ascending cellulitis.  There is no fluctuation or crepitation.  There is no increase in warmth to the foot.  No other open lesions or pre-ulcerative lesions.  No pain with calf compression, swelling, warmth, erythema  Assessment: Resolving infection left third toe with healing ulcerations bilaterally  Plan: -All treatment options discussed with the patient including all alternatives, risks, complications.  -Sharply debrided the wound in the left third toe without any complications or bleeding to remove all nonviable tissue down to healthy, granular tissue.  The wound is more  superficial and starting to scab over.  I applied MediHony to the wound today and also order this.  Continue offloading.  Is causing some irritation with a surgical shoe to the anterior ankle but he has a surgical boot at home and I would like for him to wear this.  He has been on Augmentin for some time and there is still some erythema to the toe.  And switch and put him on clindamycin which she had before the any issues. -We will see him back in the next 2 weeks.  He is eager to be cleared for hip surgery.  Unfortunately I will not be in the office at that time saw him follow-up with one the other physicians for wound evaluation.  If no signs of infection the wound is healed he can be cleared for hip surgery.  Return in about 10 days (around 04/16/2018).  Vivi BarrackMatthew R Tyja Gortney DPM

## 2018-04-10 ENCOUNTER — Telehealth: Payer: Self-pay | Admitting: Internal Medicine

## 2018-04-10 NOTE — Telephone Encounter (Signed)
Copied from CRM [id number]. Topic: Inquiry >> Apr 10, 2018  1:29 PM Windy KalataMichael, Taylor L, NT wrote: Reason for CRM: patient states his girlfriend lives with him. He states they both got diarrhea and stomach aches on 04/08/18. He states that his son is a retired Librarian, academicdoctor and he is afraid it could be cdiff. He would like to know should he be concerned and come in? He feels they have both got better. He states he has not had a BM today. His last BM was yesterday 04/09/18 evening. Patient would like a call back from Dr. Lawerance BachBurns or her nurse.

## 2018-04-10 NOTE — Telephone Encounter (Signed)
Spoke with pt to inform to stay on a bland diet, plenty of fluids, and probiotic. Pt states he is feeling better with minor symptoms right now. Advised to call back if symptoms got worse

## 2018-04-19 ENCOUNTER — Ambulatory Visit (INDEPENDENT_AMBULATORY_CARE_PROVIDER_SITE_OTHER): Payer: BLUE CROSS/BLUE SHIELD | Admitting: Podiatry

## 2018-04-19 DIAGNOSIS — L97511 Non-pressure chronic ulcer of other part of right foot limited to breakdown of skin: Secondary | ICD-10-CM

## 2018-04-21 ENCOUNTER — Telehealth: Payer: Self-pay

## 2018-04-21 NOTE — Telephone Encounter (Signed)
Call patient and let him know he needs to come in and have his Vitamin D 2 rechecked before refill can be given to see what his V2 is after being on supplement for 12 weeks.

## 2018-04-24 ENCOUNTER — Institutional Professional Consult (permissible substitution): Payer: BLUE CROSS/BLUE SHIELD | Admitting: Pulmonary Disease

## 2018-05-01 ENCOUNTER — Encounter: Payer: Self-pay | Admitting: Podiatry

## 2018-05-01 ENCOUNTER — Ambulatory Visit (INDEPENDENT_AMBULATORY_CARE_PROVIDER_SITE_OTHER): Payer: BLUE CROSS/BLUE SHIELD | Admitting: Podiatry

## 2018-05-01 DIAGNOSIS — M2041 Other hammer toe(s) (acquired), right foot: Secondary | ICD-10-CM

## 2018-05-01 DIAGNOSIS — M2042 Other hammer toe(s) (acquired), left foot: Secondary | ICD-10-CM

## 2018-05-01 DIAGNOSIS — L97529 Non-pressure chronic ulcer of other part of left foot with unspecified severity: Secondary | ICD-10-CM | POA: Diagnosis not present

## 2018-05-02 NOTE — Progress Notes (Signed)
Subjective: 57 year old male presents the office today for follow-up evaluation of wounds to both of his feet.  His wife states that the wounds are done well and they feel that both wounds of healed completely.  Denies any drainage or pus or any redness or swelling.  He states he has been off his feet more.  He has no new concerns today.  He denies any systemic complaints including fevers, chills, nausea, vomiting. No calf pain, chest pain,shortness of breath.    Objective: AAO x3, NAD-presents today with his wife. DP/PT pulses palpable bilaterally, CRT less than 3 seconds Protective sensation decreased with Simms Weinstein monofilament On the right foot the sulcus of the third toe the fissure which appears to be healed.  There is no drainage or pus there is no edema, erythema, increase in warmth. On the left third toe distally is a hyperkeratotic lesion and apparent debridement appears that the wound is healed only a small scab remains.  There is no drainage or pus and there is decrease edema there is no erythema to the toe.  There is no fluctuation or crepitation or any malodor.  Overall the wound appears to healed at this time.  No other open lesion identified. No other open lesions or pre-ulcerative lesions.  No pain with calf compression, swelling, warmth, erythema      THESE PICTURES WERE BEFORE DEBRIDEMENT     Assessment: Heel ulcerations bilaterally  Plan: -All treatment options discussed with the patient including all alternatives, risks, complications.  -I debrided the hyperkeratotic tissue of the left toe without any complication appears that the wound is healed.  There is no wound on the right foot.  I will continue to monitor the area very closely at this time there is no signs of infection the wounds appear to be healed to continue to monitor for any recurrence.  At this point we will see him back as needed.  Encouraged to call any questions or concerns any changes of the  symptoms.  Vivi BarrackMatthew R Jeydan Barner DPM

## 2018-05-07 NOTE — Progress Notes (Signed)
  Subjective:  Patient ID: [name], male    DOB: 06/25/1961,  MRN: [id number]  No chief complaint on file.  [age] y.o. male returns for wound care. Believes the wound to be looking much better.  Denies drainage.  Denies swelling denies redness.  Denies nausea vomiting fever chills.  Has been using clindamycin medihoney and tramadol.. Denies N/V/F/Ch.  Objective:  There were no vitals filed for this visit. General AA&O x3. Normal mood and affect.  Vascular Foot warm to touch.  Neurologic Sensation grossly diminished.  Dermatologic (Wound) Wound Location: Left third toe Wound Measurement: 0.2x0.2 Wound Base: Granular/Healthy Peri-wound: Calloused Exudate: None: wound tissue dry  Wound progress: Improved since last check.  Orthopedic: No pain to palpation either foot.   Assessment & Plan:  Patient was evaluated and treated and all questions answered.  Ulcer left third toe -Debridement as below. -Dressed with medihoney, DSD. -Follow-up with Dr. Ardelle AntonWagoner in 2 weeks for recheck  Return in about 2 weeks (around 05/03/2018) for Wound Care.

## 2018-05-09 DIAGNOSIS — M87051 Idiopathic aseptic necrosis of right femur: Secondary | ICD-10-CM | POA: Diagnosis not present

## 2018-05-09 DIAGNOSIS — M79672 Pain in left foot: Secondary | ICD-10-CM | POA: Diagnosis not present

## 2018-05-09 DIAGNOSIS — L97528 Non-pressure chronic ulcer of other part of left foot with other specified severity: Secondary | ICD-10-CM | POA: Diagnosis not present

## 2018-05-22 DIAGNOSIS — L97524 Non-pressure chronic ulcer of other part of left foot with necrosis of bone: Secondary | ICD-10-CM | POA: Diagnosis not present

## 2018-05-22 DIAGNOSIS — E11621 Type 2 diabetes mellitus with foot ulcer: Secondary | ICD-10-CM | POA: Diagnosis not present

## 2018-05-23 ENCOUNTER — Other Ambulatory Visit: Payer: Self-pay

## 2018-05-23 ENCOUNTER — Other Ambulatory Visit (HOSPITAL_COMMUNITY): Payer: Self-pay | Admitting: Orthopedic Surgery

## 2018-05-23 ENCOUNTER — Encounter (HOSPITAL_BASED_OUTPATIENT_CLINIC_OR_DEPARTMENT_OTHER): Payer: Self-pay | Admitting: *Deleted

## 2018-05-23 NOTE — Progress Notes (Signed)
   05/23/18 1641  OBSTRUCTIVE SLEEP APNEA  Have you ever been diagnosed with sleep apnea through a sleep study? Yes  If yes, do you have and use a CPAP or BPAP machine every night? 0  Do you snore loudly (loud enough to be heard through closed doors)?  1  Do you often feel tired, fatigued, or sleepy during the daytime (such as falling asleep during driving or talking to someone)? 0  Has anyone observed you stop breathing during your sleep? 0  Do you have, or are you being treated for high blood pressure? 1  BMI more than 35 kg/m2? 1  Age > 50 (1-yes) 1  Neck circumference greater than:Male 16 inches or larger, Male 17inches or larger? 0  Male Gender (Yes=1) 1  Obstructive Sleep Apnea Score 5  Score 5 or greater  Results sent to PCP (dr Kennyth Arnoldstacy burns)

## 2018-05-25 ENCOUNTER — Ambulatory Visit (HOSPITAL_BASED_OUTPATIENT_CLINIC_OR_DEPARTMENT_OTHER): Payer: BLUE CROSS/BLUE SHIELD | Admitting: Certified Registered"

## 2018-05-25 ENCOUNTER — Encounter (HOSPITAL_BASED_OUTPATIENT_CLINIC_OR_DEPARTMENT_OTHER)
Admission: RE | Disposition: A | Discharge status: Home / Self Care | Payer: Self-pay | Source: Ambulatory Visit | Attending: Orthopedic Surgery

## 2018-05-25 ENCOUNTER — Encounter (HOSPITAL_BASED_OUTPATIENT_CLINIC_OR_DEPARTMENT_OTHER): Payer: Self-pay | Admitting: Certified Registered"

## 2018-05-25 ENCOUNTER — Ambulatory Visit (HOSPITAL_BASED_OUTPATIENT_CLINIC_OR_DEPARTMENT_OTHER)
Admission: RE | Admit: 2018-05-25 | Discharge: 2018-05-25 | Disposition: A | Discharge status: Home / Self Care | Payer: BLUE CROSS/BLUE SHIELD | Source: Ambulatory Visit | Attending: Orthopedic Surgery | Admitting: Orthopedic Surgery

## 2018-05-25 DIAGNOSIS — G4733 Obstructive sleep apnea (adult) (pediatric): Secondary | ICD-10-CM | POA: Diagnosis not present

## 2018-05-25 DIAGNOSIS — M869 Osteomyelitis, unspecified: Secondary | ICD-10-CM | POA: Diagnosis not present

## 2018-05-25 DIAGNOSIS — M86672 Other chronic osteomyelitis, left ankle and foot: Secondary | ICD-10-CM | POA: Diagnosis not present

## 2018-05-25 DIAGNOSIS — M160 Bilateral primary osteoarthritis of hip: Secondary | ICD-10-CM | POA: Diagnosis not present

## 2018-05-25 DIAGNOSIS — Z888 Allergy status to other drugs, medicaments and biological substances status: Secondary | ICD-10-CM | POA: Diagnosis not present

## 2018-05-25 DIAGNOSIS — E785 Hyperlipidemia, unspecified: Secondary | ICD-10-CM | POA: Diagnosis not present

## 2018-05-25 DIAGNOSIS — I10 Essential (primary) hypertension: Secondary | ICD-10-CM | POA: Insufficient documentation

## 2018-05-25 DIAGNOSIS — M868X7 Other osteomyelitis, ankle and foot: Secondary | ICD-10-CM | POA: Insufficient documentation

## 2018-05-25 DIAGNOSIS — L97529 Non-pressure chronic ulcer of other part of left foot with unspecified severity: Secondary | ICD-10-CM | POA: Insufficient documentation

## 2018-05-25 DIAGNOSIS — E1169 Type 2 diabetes mellitus with other specified complication: Secondary | ICD-10-CM | POA: Insufficient documentation

## 2018-05-25 DIAGNOSIS — E11621 Type 2 diabetes mellitus with foot ulcer: Secondary | ICD-10-CM | POA: Insufficient documentation

## 2018-05-25 DIAGNOSIS — M19042 Primary osteoarthritis, left hand: Secondary | ICD-10-CM | POA: Diagnosis not present

## 2018-05-25 DIAGNOSIS — Z87891 Personal history of nicotine dependence: Secondary | ICD-10-CM | POA: Diagnosis not present

## 2018-05-25 DIAGNOSIS — Z79899 Other long term (current) drug therapy: Secondary | ICD-10-CM | POA: Insufficient documentation

## 2018-05-25 DIAGNOSIS — M19041 Primary osteoarthritis, right hand: Secondary | ICD-10-CM | POA: Insufficient documentation

## 2018-05-25 DIAGNOSIS — Z8719 Personal history of other diseases of the digestive system: Secondary | ICD-10-CM | POA: Insufficient documentation

## 2018-05-25 DIAGNOSIS — Z96653 Presence of artificial knee joint, bilateral: Secondary | ICD-10-CM | POA: Insufficient documentation

## 2018-05-25 DIAGNOSIS — E1142 Type 2 diabetes mellitus with diabetic polyneuropathy: Secondary | ICD-10-CM | POA: Diagnosis not present

## 2018-05-25 DIAGNOSIS — L97524 Non-pressure chronic ulcer of other part of left foot with necrosis of bone: Secondary | ICD-10-CM

## 2018-05-25 HISTORY — PX: AMPUTATION TOE: SHX6595

## 2018-05-25 HISTORY — DX: Myoneural disorder, unspecified: G70.9

## 2018-05-25 LAB — POCT I-STAT, CHEM 8
BUN: 15 mg/dL (ref 6–20)
CHLORIDE: 98 mmol/L (ref 98–111)
Calcium, Ion: 1.06 mmol/L — ABNORMAL LOW (ref 1.15–1.40)
Creatinine, Ser: 0.8 mg/dL (ref 0.61–1.24)
GLUCOSE: 131 mg/dL — AB (ref 70–99)
HEMATOCRIT: 41 % (ref 39.0–52.0)
HEMOGLOBIN: 13.9 g/dL (ref 13.0–17.0)
Potassium: 3.8 mmol/L (ref 3.5–5.1)
SODIUM: 135 mmol/L (ref 135–145)
TCO2: 24 mmol/L (ref 22–32)

## 2018-05-25 SURGERY — AMPUTATION, TOE
Anesthesia: Monitor Anesthesia Care | Site: Foot | Laterality: Left

## 2018-05-25 MED ORDER — PROPOFOL 500 MG/50ML IV EMUL
INTRAVENOUS | Status: DC | PRN
  Administered 2018-05-25: 10075 ug/kg/min via INTRAVENOUS

## 2018-05-25 MED ORDER — OXYCODONE HCL 5 MG PO TABS: 5.0000 mg | ORAL_TABLET | Freq: Once | ORAL | Status: DC | PRN

## 2018-05-25 MED ORDER — BUPIVACAINE-EPINEPHRINE 0.5% -1:200000 IJ SOLN
INTRAMUSCULAR | Status: DC | PRN
  Administered 2018-05-25: 10 mL

## 2018-05-25 MED ORDER — FENTANYL CITRATE (PF) 100 MCG/2ML IJ SOLN
50.0000 ug | INTRAMUSCULAR | Status: DC | PRN
  Administered 2018-05-25: 100 ug via INTRAVENOUS

## 2018-05-25 MED ORDER — MIDAZOLAM HCL 2 MG/2ML IJ SOLN
1.0000 mg | INTRAMUSCULAR | Status: DC | PRN
  Administered 2018-05-25: 2 mg via INTRAVENOUS

## 2018-05-25 MED ORDER — ONDANSETRON HCL 4 MG/2ML IJ SOLN
INTRAMUSCULAR | Status: DC | PRN
  Administered 2018-05-25: 4 mg via INTRAVENOUS

## 2018-05-25 MED ORDER — OXYCODONE HCL 5 MG/5ML PO SOLN: 5.0000 mg | Freq: Once | ORAL | Status: DC | PRN

## 2018-05-25 MED ORDER — MIDAZOLAM HCL 2 MG/2ML IJ SOLN
INTRAMUSCULAR | Status: AC
  Filled 2018-05-25: qty 2

## 2018-05-25 MED ORDER — LACTATED RINGERS IV SOLN
INTRAVENOUS | Status: DC
  Administered 2018-05-25: 14:00:00 via INTRAVENOUS

## 2018-05-25 MED ORDER — CEFAZOLIN SODIUM-DEXTROSE 2-4 GM/100ML-% IV SOLN
2.0000 g | INTRAVENOUS | Status: AC
  Administered 2018-05-25: 3 g via INTRAVENOUS

## 2018-05-25 MED ORDER — CEFAZOLIN SODIUM-DEXTROSE 2-4 GM/100ML-% IV SOLN
INTRAVENOUS | Status: AC
  Filled 2018-05-25: qty 200

## 2018-05-25 MED ORDER — FENTANYL CITRATE (PF) 100 MCG/2ML IJ SOLN: 25.0000 ug | INTRAMUSCULAR | Status: DC | PRN

## 2018-05-25 MED ORDER — SCOPOLAMINE 1 MG/3DAYS TD PT72: 1.0000 | MEDICATED_PATCH | Freq: Once | TRANSDERMAL | Status: DC | PRN

## 2018-05-25 MED ORDER — CHLORHEXIDINE GLUCONATE 4 % EX LIQD: 60.0000 mL | Freq: Once | CUTANEOUS | Status: DC

## 2018-05-25 MED ORDER — SODIUM CHLORIDE 0.9 % IV SOLN: INTRAVENOUS | Status: DC

## 2018-05-25 MED ORDER — FENTANYL CITRATE (PF) 100 MCG/2ML IJ SOLN
INTRAMUSCULAR | Status: AC
  Filled 2018-05-25: qty 2

## 2018-05-25 SURGICAL SUPPLY — 56 items
BLADE AVERAGE 25X9 (BLADE) IMPLANT
BLADE OSC/SAG .038X5.5 CUT EDG (BLADE) IMPLANT
BLADE SURG 10 STRL SS (BLADE) IMPLANT
BLADE SURG 15 STRL LF DISP TIS (BLADE) ×1 IMPLANT
BLADE SURG 15 STRL SS (BLADE) ×1
BNDG COHESIVE 4X5 TAN STRL (GAUZE/BANDAGES/DRESSINGS) ×2 IMPLANT
BNDG CONFORM 3 STRL LF (GAUZE/BANDAGES/DRESSINGS) IMPLANT
BNDG ESMARK 4X9 LF (GAUZE/BANDAGES/DRESSINGS) ×2 IMPLANT
CHLORAPREP W/TINT 26ML (MISCELLANEOUS) ×2 IMPLANT
COVER BACK TABLE 60X90IN (DRAPES) ×2 IMPLANT
CUFF TOURNIQUET SINGLE 24IN (TOURNIQUET CUFF) IMPLANT
DECANTER SPIKE VIAL GLASS SM (MISCELLANEOUS) IMPLANT
DRAPE EXTREMITY T 121X128X90 (DRAPE) ×2 IMPLANT
DRAPE SURG 17X23 STRL (DRAPES) ×2 IMPLANT
DRAPE U-SHAPE 47X51 STRL (DRAPES) ×2 IMPLANT
DRSG EMULSION OIL 3X3 NADH (GAUZE/BANDAGES/DRESSINGS) IMPLANT
DRSG MEPITEL 4X7.2 (GAUZE/BANDAGES/DRESSINGS) ×2 IMPLANT
DRSG PAD ABDOMINAL 8X10 ST (GAUZE/BANDAGES/DRESSINGS) IMPLANT
ELECT REM PT RETURN 9FT ADLT (ELECTROSURGICAL) ×2
ELECTRODE REM PT RTRN 9FT ADLT (ELECTROSURGICAL) ×1 IMPLANT
GAUZE SPONGE 4X4 12PLY STRL (GAUZE/BANDAGES/DRESSINGS) ×2 IMPLANT
GLOVE BIO SURGEON STRL SZ8 (GLOVE) ×2 IMPLANT
GLOVE BIOGEL PI IND STRL 8 (GLOVE) ×2 IMPLANT
GLOVE BIOGEL PI INDICATOR 8 (GLOVE) ×2
GLOVE ECLIPSE 8.0 STRL XLNG CF (GLOVE) ×2 IMPLANT
GOWN STRL REUS W/ TWL LRG LVL3 (GOWN DISPOSABLE) ×1 IMPLANT
GOWN STRL REUS W/ TWL XL LVL3 (GOWN DISPOSABLE) ×2 IMPLANT
GOWN STRL REUS W/TWL LRG LVL3 (GOWN DISPOSABLE) ×1
GOWN STRL REUS W/TWL XL LVL3 (GOWN DISPOSABLE) ×2
NDL SAFETY ECLIPSE 18X1.5 (NEEDLE) IMPLANT
NEEDLE HYPO 18GX1.5 SHARP (NEEDLE)
NEEDLE HYPO 25X1 1.5 SAFETY (NEEDLE) IMPLANT
NS IRRIG 1000ML POUR BTL (IV SOLUTION) ×2 IMPLANT
PACK BASIN DAY SURGERY FS (CUSTOM PROCEDURE TRAY) ×2 IMPLANT
PAD CAST 4YDX4 CTTN HI CHSV (CAST SUPPLIES) ×1 IMPLANT
PADDING CAST COTTON 4X4 STRL (CAST SUPPLIES) ×1
PENCIL BUTTON HOLSTER BLD 10FT (ELECTRODE) ×2 IMPLANT
SANITIZER HAND PURELL 535ML FO (MISCELLANEOUS) ×2 IMPLANT
SHEET MEDIUM DRAPE 40X70 STRL (DRAPES) ×2 IMPLANT
SLEEVE SCD COMPRESS KNEE MED (MISCELLANEOUS) ×2 IMPLANT
SPONGE LAP 18X18 RF (DISPOSABLE) ×2 IMPLANT
STOCKINETTE 6  STRL (DRAPES) ×1
STOCKINETTE 6 STRL (DRAPES) ×1 IMPLANT
SUCTION FRAZIER HANDLE 10FR (MISCELLANEOUS)
SUCTION TUBE FRAZIER 10FR DISP (MISCELLANEOUS) IMPLANT
SUT ETHILON 2 0 FS 18 (SUTURE) IMPLANT
SUT ETHILON 2 0 FSLX (SUTURE) IMPLANT
SUT ETHILON 3 0 PS 1 (SUTURE) IMPLANT
SUT MNCRL AB 3-0 PS2 18 (SUTURE) IMPLANT
SWAB COLLECTION DEVICE MRSA (MISCELLANEOUS) IMPLANT
SWAB CULTURE ESWAB REG 1ML (MISCELLANEOUS) IMPLANT
SYR BULB 3OZ (MISCELLANEOUS) ×2 IMPLANT
SYR CONTROL 10ML LL (SYRINGE) IMPLANT
TOWEL GREEN STERILE FF (TOWEL DISPOSABLE) ×2 IMPLANT
TUBE CONNECTING 20X1/4 (TUBING) IMPLANT
UNDERPAD 30X30 (UNDERPADS AND DIAPERS) ×2 IMPLANT

## 2018-05-25 NOTE — Anesthesia Preprocedure Evaluation (Addendum)
Anesthesia Evaluation  Patient identified by MRN, date of birth, ID band Patient awake    Reviewed: Allergy & Precautions, NPO status , Patient's Chart, lab work & pertinent test results  Airway Mallampati: II  TM Distance: >3 FB Neck ROM: Full    Dental no notable dental hx.    Pulmonary sleep apnea , former smoker,    Pulmonary exam normal breath sounds clear to auscultation       Cardiovascular hypertension, Pt. on home beta blockers and Pt. on medications Normal cardiovascular exam Rhythm:Regular Rate:Normal     Neuro/Psych PSYCHIATRIC DISORDERS  Neuromuscular disease    GI/Hepatic negative GI ROS, Neg liver ROS,   Endo/Other  diabetes  Renal/GU negative Renal ROS     Musculoskeletal negative musculoskeletal ROS (+) Arthritis ,   Abdominal   Peds  Hematology negative hematology ROS (+)   Anesthesia Other Findings   Reproductive/Obstetrics negative OB ROS                             Anesthesia Physical Anesthesia Plan  ASA: II  Anesthesia Plan: MAC   Post-op Pain Management:  Regional for Post-op pain   Induction: Intravenous  PONV Risk Score and Plan:   Airway Management Planned: Simple Face Mask  Additional Equipment:   Intra-op Plan:   Post-operative Plan:   Informed Consent: I have reviewed the patients History and Physical, chart, labs and discussed the procedure including the risks, benefits and alternatives for the proposed anesthesia with the patient or authorized representative who has indicated his/her understanding and acceptance.   Dental advisory given  Plan Discussed with: CRNA  Anesthesia Plan Comments:         Anesthesia Quick Evaluation

## 2018-05-25 NOTE — H&P (Signed)
[name] is an 57 y.o. male.   Chief Complaint: Left third toe infection HPI: The patient is a 57 year old male with a history of diabetes.  He has a diabetic ulcer at the tip of his third toe for the last several months.  An MRI reveals osteomyelitis at the distal phalanx.  He presents now for amputation of the third toe.  Past Medical History:  Diagnosis Date  . Arthritis    hips, hands  . Diverticulitis    portion of colon removed  . Hip pain   . History of ventral hernia repair 2016  . Hypertension   . Neuromuscular disorder (HCC)    peripheral neuropathy  . Pre-diabetes   . Sleep apnea    has OSA-not used CPAP 2-3 yrs    Past Surgical History:  Procedure Laterality Date  . HERNIA REPAIR  2016   ventral  . JOINT REPLACEMENT     b/l knees     Family History  Adopted: Yes  Family history unknown: Yes   Social History:  reports that he has quit smoking. He has never used smokeless tobacco. He reports that he drinks alcohol. He reports that he has current or past drug history. Drug: Marijuana. Frequency: 3.00 times per week.  Allergies:  Allergies  Allergen Reactions  . Claritin [Loratadine]     SOB    Medications Prior to Admission  Medication Sig Dispense Refill  . DULoxetine (CYMBALTA) 20 MG capsule Take 1 capsule (20 mg total) by mouth daily. 30 capsule 3  . folic acid (FOLVITE) 1 MG tablet Take 1 tablet (1 mg total) by mouth daily. 30 tablet 0  . furosemide (LASIX) 20 MG tablet Take 2 tablets (40 mg total) by mouth daily. 60 tablet 3  . metoprolol tartrate (LOPRESSOR) 100 MG tablet TAKE 1 TABLET (100 MG TOTAL) BY MOUTH 2 (TWO) TIMES DAILY. 180 tablet 3  . Multiple Vitamin (MULTIVITAMIN WITH MINERALS) TABS tablet Take 1 tablet by mouth daily. 30 tablet 0  . saccharomyces boulardii (FLORASTOR) 250 MG capsule Take 1 capsule (250 mg total) by mouth 2 (two) times daily. 30 capsule 0  . thiamine 100 MG tablet Take 1 tablet (100 mg total) by mouth daily. 30 tablet 0   . traMADol (ULTRAM) 50 MG tablet tramadol 50 mg tablet  Take 1 tablet(s) EVERY 6 HOURS as needed for hip pain.    . Wound Dressings (MEDIHONEY WOUND/BURN DRESSING) GEL Apply 1 application topically daily. 1 Tube 0  . collagenase (SANTYL) ointment Apply topically daily. Cleanse wound to left dorsal foot and third toe with NS.  Apply Santyl to wound bed.  Cover with dry dressing and tape.  Right second toe dehiscence"  Cleanse all toes with soap and water and pat dry.  Apply Xeroform gauze to suture line.  Cover with dry dressing.  CHange daily 15 g 0  . Docusate Sodium (STOOL SOFTENER) 100 MG capsule Take 100 mg by mouth 2 (two) times daily as needed for constipation.      Results for orders placed or performed during the hospital encounter of 05/25/18 (from the past 48 hour(s))  I-STAT, chem 8     Status: Abnormal   Collection Time: 05/25/18  1:12 PM  Result Value Ref Range   Sodium 135 135 - 145 mmol/L   Potassium 3.8 3.5 - 5.1 mmol/L   Chloride 98 98 - 111 mmol/L   BUN 15 6 - 20 mg/dL   Creatinine, Ser 1.61 0.61 - 1.24 mg/dL  Glucose, Bld 131 (H) 70 - 99 mg/dL   Calcium, Ion 1.611.06 (L) 1.15 - 1.40 mmol/L   TCO2 24 22 - 32 mmol/L   Hemoglobin 13.9 13.0 - 17.0 g/dL   HCT 09.641.0 04.539.0 - 40.952.0 %   No results found.  ROS no recent fever, chills, nausea, vomiting or changes in his appetite  Pulse (!) 59, temperature (!) 97.5 F (36.4 C), temperature source Oral, height 6\' 1"  (1.854 m), weight 127 kg. Physical Exam  Well-nourished well-developed man in no apparent distress.  Alert and oriented x4.  Mood and affect are normal.  Extraocular motions are intact.  Respirations are unlabored.  Gait is antalgic bilaterally.  The left foot third toe has an ulcer at the tip.  Skin is otherwise healthy and intact.  Diminished sensibility to light touch at the forefoot.  No lymphadenopathy.  Brisk capillary refill of the toes.  5 out of 5 strength in plantar flexion and dorsi flexion of the ankle and  toes.  Assessment/Plan Left third toe chronic diabetic ulcer with osteomyelitis -to the operating room for left third toe amputation.  The risks and benefits of the alternative treatment options have been discussed in detail.  The patient wishes to proceed with surgery and specifically understands risks of bleeding, infection, nerve damage, blood clots, need for additional surgery, amputation and death.   Toni ArthursHEWITT, [name] Chaudoin, MD 05/25/2018, 1:52 PM

## 2018-05-25 NOTE — Discharge Instructions (Addendum)
Toni ArthursJohn Hewitt, MD [facility]  Please read the following information regarding your care after surgery.  Medications  You only need a prescription for the narcotic pain medicine (ex. oxycodone, Percocet, Norco).  All of the other medicines listed below are available over the counter. X Aleve 2 pills twice a day for the first 3 days after surgery. X acetominophen (Tylenol) 650 mg every 4-6 hours as you need for minor to moderate pain  Weight Bearing X Bear weight only on your operated foot in the post-op shoe.  Cast / Splint / Dressing X Keep your splint, cast or dressing clean and dry.  Dont put anything (coat hanger, pencil, etc) down inside of it.  If it gets damp, use a hair dryer on the cool setting to dry it.  If it gets soaked, call the office to schedule an appointment for a cast change. After your dressing, cast or splint is removed; you may shower, but do not soak or scrub the wound.  Allow the water to run over it, and then gently pat it dry.  Swelling It is normal for you to have swelling where you had surgery.  To reduce swelling and pain, keep your toes above your nose for at least 3 days after surgery.  It may be necessary to keep your foot or leg elevated for several weeks.  If it hurts, it should be elevated.  Follow Up Call my office at [phone] when you are discharged from the hospital or surgery center to schedule an appointment to be seen two weeks after surgery.  Call my office at [phone] if you develop a fever >101.5 F, nausea, vomiting, bleeding from the surgical site or severe pain.     Monitored Anesthesia Care, Care After These instructions provide you with information about caring for yourself after your procedure. Your health care provider may also give you more specific instructions. Your treatment has been planned according to current medical practices, but problems sometimes occur. Call your health care provider if you have any problems  or questions after your procedure. What can I expect after the procedure? After your procedure, it is common to:  Feel sleepy for several hours.  Feel clumsy and have poor balance for several hours.  Feel forgetful about what happened after the procedure.  Have poor judgment for several hours.  Feel nauseous or vomit.  Have a sore throat if you had a breathing tube during the procedure.  Follow these instructions at home: For at least 24 hours after the procedure:   Do not: ? Participate in activities in which you could fall or become injured. ? Drive. ? Use heavy machinery. ? Drink alcohol. ? Take sleeping pills or medicines that cause drowsiness. ? Make important decisions or sign legal documents. ? Take care of children on your own.  Rest. Eating and drinking  Follow the diet that is recommended by your health care provider.  If you vomit, drink water, juice, or soup when you can drink without vomiting.  Make sure you have little or no nausea before eating solid foods. General instructions  Have a responsible adult stay with you until you are awake and alert.  Take over-the-counter and prescription medicines only as told by your health care provider.  If you smoke, do not smoke without supervision.  Keep all follow-up visits as told by your health care provider. This is important. Contact a health care provider if:  You keep feeling nauseous or you keep vomiting.  You  feel light-headed.  You develop a rash.  You have a fever. Get help right away if:  You have trouble breathing. This information is not intended to replace advice given to you by your health care provider. Make sure you discuss any questions you have with your health care provider. Document Released: 01/25/2016 Document Revised: 05/26/2016 Document Reviewed: 01/25/2016 Elsevier Interactive Patient Education  [employer].

## 2018-05-25 NOTE — Anesthesia Postprocedure Evaluation (Addendum)
Anesthesia Post Note  Patient: [name]  Procedure(s) Performed: AMPUTATION TOE left 3rd (Left Foot)     Patient location during evaluation: PACU Anesthesia Type: MAC Level of consciousness: awake and alert Pain management: pain level controlled Vital Signs Assessment: post-procedure vital signs reviewed and stable Respiratory status: spontaneous breathing Cardiovascular status: stable Anesthetic complications: no    Last Vitals:  Vitals:   05/25/18 1506 05/25/18 1522  BP:  (!) 170/97  Pulse:  65  Resp:  16  Temp:  36.7 C  SpO2: 98% 100%    Last Pain:  Vitals:   05/25/18 1522  TempSrc:   PainSc: 0-No pain                 Nolon Nations

## 2018-05-25 NOTE — Op Note (Signed)
05/25/2018  2:39 PM  PATIENT:  [name]  57 y.o. male  PRE-OPERATIVE DIAGNOSIS:  left 3rd toe osteomyelitis  POST-OPERATIVE DIAGNOSIS:  left 3rd toe osteomyelitis  Procedure(s): Left third toe amputation through the PIP joint  SURGEON:  Wylene Simmer, MD  ASSISTANT: None  ANESTHESIA:   Local, MAC  EBL:  minimal   TOURNIQUET: Approximately 10 minutes with an ankle Esmarch  COMPLICATIONS:  None apparent  DISPOSITION:  Extubated, awake and stable to recovery.  INDICATION FOR PROCEDURE: The patient is a 57 year old male with past medical history significant for diabetes.  He has a diabetic ulcer at the third toe with osteomyelitis at the distal phalanx.  He presents now for amputation of his left third toe.  The risks and benefits of the alternative treatment options have been discussed in detail.  The patient wishes to proceed with surgery and specifically understands risks of bleeding, infection, nerve damage, blood clots, need for additional surgery, amputation and death.  PROCEDURE IN DETAIL:  After pre operative consent was obtained, and the correct operative site was identified, the patient was brought to the operating room and placed supine on the OR table.  Anesthesia was administered.  Pre-operative antibiotics were administered.  A surgical timeout was taken.  A metatarsal block was performed with half percent Marcaine with epinephrine.  The left foot was then prepped and draped in standard sterile fashion.  The foot was exsanguinated and a 4 inch Esmarch tourniquet wrapped around the ankle.  A fishmouth incision was marked on the toe proximal from the area of the wound.  The incision was made and dissection was carried down to the level of the bone.  Subperiosteal dissection was then carried proximally to the level of the PIP joint.  The toe was disarticulated through the PIP joint.  Wound was irrigated copiously.  The neurovascular bundles were cauterized.  The incision was closed  with horizontal mattress sutures of 3-0 nylon.  Sterile dressings were applied followed by compression wrap.  Tourniquet was released after application of the dressings.  The patient was awakened from anesthesia and transported to the recovery room in stable condition.  FOLLOW UP PLAN: Weightbearing as tolerated in a flat postop shoe.  Follow-up in the office in 2 weeks for suture removal.

## 2018-05-25 NOTE — Transfer of Care (Signed)
Immediate Anesthesia Transfer of Care Note  Patient: [name]  Procedure(s) Performed: AMPUTATION TOE left 3rd (Left Foot)  Patient Location: PACU  Anesthesia Type:MAC  Level of Consciousness: awake, alert , oriented and patient cooperative  Airway & Oxygen Therapy: Patient Spontanous Breathing and Patient connected to face mask oxygen  Post-op Assessment: Report given to RN and Post -op Vital signs reviewed and stable  Post vital signs: Reviewed and stable  Last Vitals:  Vitals Value Taken Time  BP    Temp    Pulse 63 05/25/2018  2:43 PM  Resp    SpO2 99 % 05/25/2018  2:43 PM  Vitals shown include unvalidated device data.  Last Pain:  Vitals:   05/25/18 1319  TempSrc: Oral  PainSc: 8          Complications: No apparent anesthesia complications

## 2018-05-25 NOTE — Anesthesia Procedure Notes (Signed)
Procedure Name: MAC Date/Time: 05/25/2018 2:12 PM Performed by: Signe Colt, CRNA Pre-anesthesia Checklist: Patient identified, Emergency Drugs available, Suction available, Patient being monitored and Timeout performed Patient Re-evaluated:Patient Re-evaluated prior to induction Oxygen Delivery Method: Simple face mask

## 2018-05-26 ENCOUNTER — Encounter (HOSPITAL_BASED_OUTPATIENT_CLINIC_OR_DEPARTMENT_OTHER): Payer: Self-pay | Admitting: Orthopedic Surgery

## 2018-06-22 ENCOUNTER — Telehealth: Payer: Self-pay | Admitting: Internal Medicine

## 2018-06-22 NOTE — Telephone Encounter (Signed)
[name] dropped off Preoperative Risk Eval form from [facility] for completion.   Please fax completed form to [facility] Attn: Halford Decamp at [phone].  Form placed in Dr. Lawerance Bach box for pick up & completion.

## 2018-06-23 NOTE — Telephone Encounter (Signed)
LVM for pt to call back to set up a CPE with Dr. Lawerance Bach to have his pre op formed completed.

## 2018-06-25 ENCOUNTER — Other Ambulatory Visit (INDEPENDENT_AMBULATORY_CARE_PROVIDER_SITE_OTHER): Payer: Self-pay | Admitting: Orthopaedic Surgery

## 2018-06-26 ENCOUNTER — Encounter (HOSPITAL_BASED_OUTPATIENT_CLINIC_OR_DEPARTMENT_OTHER): Payer: Self-pay | Admitting: *Deleted

## 2018-06-26 ENCOUNTER — Other Ambulatory Visit (HOSPITAL_COMMUNITY): Payer: Self-pay | Admitting: Orthopedic Surgery

## 2018-06-26 ENCOUNTER — Other Ambulatory Visit: Payer: Self-pay

## 2018-06-28 ENCOUNTER — Encounter (HOSPITAL_BASED_OUTPATIENT_CLINIC_OR_DEPARTMENT_OTHER)
Admission: RE | Disposition: A | Discharge status: Home / Self Care | Payer: Self-pay | Source: Ambulatory Visit | Attending: Orthopedic Surgery

## 2018-06-28 ENCOUNTER — Ambulatory Visit (HOSPITAL_BASED_OUTPATIENT_CLINIC_OR_DEPARTMENT_OTHER): Payer: BLUE CROSS/BLUE SHIELD | Admitting: Anesthesiology

## 2018-06-28 ENCOUNTER — Ambulatory Visit (HOSPITAL_BASED_OUTPATIENT_CLINIC_OR_DEPARTMENT_OTHER)
Admission: RE | Admit: 2018-06-28 | Discharge: 2018-06-28 | Disposition: A | Discharge status: Home / Self Care | Payer: BLUE CROSS/BLUE SHIELD | Source: Ambulatory Visit | Attending: Orthopedic Surgery | Admitting: Orthopedic Surgery

## 2018-06-28 DIAGNOSIS — Z888 Allergy status to other drugs, medicaments and biological substances status: Secondary | ICD-10-CM | POA: Insufficient documentation

## 2018-06-28 DIAGNOSIS — Z79899 Other long term (current) drug therapy: Secondary | ICD-10-CM | POA: Insufficient documentation

## 2018-06-28 DIAGNOSIS — Y835 Amputation of limb(s) as the cause of abnormal reaction of the patient, or of later complication, without mention of misadventure at the time of the procedure: Secondary | ICD-10-CM | POA: Diagnosis not present

## 2018-06-28 DIAGNOSIS — G4733 Obstructive sleep apnea (adult) (pediatric): Secondary | ICD-10-CM | POA: Diagnosis not present

## 2018-06-28 DIAGNOSIS — M869 Osteomyelitis, unspecified: Secondary | ICD-10-CM | POA: Diagnosis not present

## 2018-06-28 DIAGNOSIS — Z87891 Personal history of nicotine dependence: Secondary | ICD-10-CM | POA: Diagnosis not present

## 2018-06-28 DIAGNOSIS — E1140 Type 2 diabetes mellitus with diabetic neuropathy, unspecified: Secondary | ICD-10-CM | POA: Insufficient documentation

## 2018-06-28 DIAGNOSIS — M19041 Primary osteoarthritis, right hand: Secondary | ICD-10-CM | POA: Insufficient documentation

## 2018-06-28 DIAGNOSIS — M19042 Primary osteoarthritis, left hand: Secondary | ICD-10-CM | POA: Insufficient documentation

## 2018-06-28 DIAGNOSIS — T8130XA Disruption of wound, unspecified, initial encounter: Secondary | ICD-10-CM | POA: Diagnosis not present

## 2018-06-28 DIAGNOSIS — I10 Essential (primary) hypertension: Secondary | ICD-10-CM | POA: Diagnosis not present

## 2018-06-28 DIAGNOSIS — T8781 Dehiscence of amputation stump: Secondary | ICD-10-CM | POA: Insufficient documentation

## 2018-06-28 DIAGNOSIS — E1169 Type 2 diabetes mellitus with other specified complication: Secondary | ICD-10-CM | POA: Diagnosis not present

## 2018-06-28 DIAGNOSIS — M160 Bilateral primary osteoarthritis of hip: Secondary | ICD-10-CM | POA: Insufficient documentation

## 2018-06-28 DIAGNOSIS — T8131XA Disruption of external operation (surgical) wound, not elsewhere classified, initial encounter: Secondary | ICD-10-CM | POA: Diagnosis not present

## 2018-06-28 DIAGNOSIS — T8131XD Disruption of external operation (surgical) wound, not elsewhere classified, subsequent encounter: Secondary | ICD-10-CM

## 2018-06-28 HISTORY — PX: AMPUTATION TOE: SHX6595

## 2018-06-28 LAB — POCT I-STAT, CHEM 8
BUN: 5 mg/dL — AB (ref 6–20)
CREATININE: 0.7 mg/dL (ref 0.61–1.24)
Calcium, Ion: 1.01 mmol/L — ABNORMAL LOW (ref 1.15–1.40)
Chloride: 97 mmol/L — ABNORMAL LOW (ref 98–111)
Glucose, Bld: 152 mg/dL — ABNORMAL HIGH (ref 70–99)
HEMATOCRIT: 39 % (ref 39.0–52.0)
Hemoglobin: 13.3 g/dL (ref 13.0–17.0)
POTASSIUM: 3.5 mmol/L (ref 3.5–5.1)
Sodium: 136 mmol/L (ref 135–145)
TCO2: 24 mmol/L (ref 22–32)

## 2018-06-28 SURGERY — AMPUTATION, TOE
Anesthesia: Monitor Anesthesia Care | Site: Foot | Laterality: Left

## 2018-06-28 MED ORDER — METOCLOPRAMIDE HCL 5 MG/ML IJ SOLN: 10.0000 mg | Freq: Once | INTRAMUSCULAR | Status: DC | PRN

## 2018-06-28 MED ORDER — LIDOCAINE 2% (20 MG/ML) 5 ML SYRINGE
INTRAMUSCULAR | Status: DC | PRN
  Administered 2018-06-28: 100 mg via INTRAVENOUS

## 2018-06-28 MED ORDER — SCOPOLAMINE 1 MG/3DAYS TD PT72: 1.0000 | MEDICATED_PATCH | Freq: Once | TRANSDERMAL | Status: DC | PRN

## 2018-06-28 MED ORDER — FENTANYL CITRATE (PF) 100 MCG/2ML IJ SOLN: 25.0000 ug | INTRAMUSCULAR | Status: DC | PRN

## 2018-06-28 MED ORDER — LACTATED RINGERS IV SOLN
INTRAVENOUS | Status: DC
  Administered 2018-06-28: 13:00:00 via INTRAVENOUS

## 2018-06-28 MED ORDER — ONDANSETRON HCL 4 MG/2ML IJ SOLN
INTRAMUSCULAR | Status: DC | PRN
  Administered 2018-06-28: 4 mg via INTRAVENOUS

## 2018-06-28 MED ORDER — CEFAZOLIN SODIUM-DEXTROSE 2-4 GM/100ML-% IV SOLN
INTRAVENOUS | Status: AC
  Filled 2018-06-28: qty 200

## 2018-06-28 MED ORDER — MEPERIDINE HCL 25 MG/ML IJ SOLN: 6.2500 mg | INTRAMUSCULAR | Status: DC | PRN

## 2018-06-28 MED ORDER — FENTANYL CITRATE (PF) 100 MCG/2ML IJ SOLN
INTRAMUSCULAR | Status: AC
  Filled 2018-06-28: qty 2

## 2018-06-28 MED ORDER — ONDANSETRON HCL 4 MG/2ML IJ SOLN
INTRAMUSCULAR | Status: AC
  Filled 2018-06-28: qty 2

## 2018-06-28 MED ORDER — PROPOFOL 10 MG/ML IV BOLUS
INTRAVENOUS | Status: AC
  Filled 2018-06-28: qty 20

## 2018-06-28 MED ORDER — LACTATED RINGERS IV SOLN: INTRAVENOUS | Status: DC

## 2018-06-28 MED ORDER — SODIUM CHLORIDE 0.9 % IV SOLN: INTRAVENOUS | Status: DC

## 2018-06-28 MED ORDER — FENTANYL CITRATE (PF) 100 MCG/2ML IJ SOLN
50.0000 ug | INTRAMUSCULAR | Status: DC | PRN
  Administered 2018-06-28: 100 ug via INTRAVENOUS

## 2018-06-28 MED ORDER — BUPIVACAINE-EPINEPHRINE 0.5% -1:200000 IJ SOLN
INTRAMUSCULAR | Status: DC | PRN
  Administered 2018-06-28: 16 mL

## 2018-06-28 MED ORDER — PROPOFOL 500 MG/50ML IV EMUL
INTRAVENOUS | Status: DC | PRN
  Administered 2018-06-28: 100 ug/kg/min via INTRAVENOUS

## 2018-06-28 MED ORDER — HYDROCODONE-ACETAMINOPHEN 5-325 MG PO TABS: 1.0000 | ORAL_TABLET | Freq: Four times a day (QID) | ORAL | 0 refills | Status: DC | PRN

## 2018-06-28 MED ORDER — DEXAMETHASONE SODIUM PHOSPHATE 10 MG/ML IJ SOLN
INTRAMUSCULAR | Status: AC
  Filled 2018-06-28: qty 1

## 2018-06-28 MED ORDER — MIDAZOLAM HCL 2 MG/2ML IJ SOLN
INTRAMUSCULAR | Status: AC
  Filled 2018-06-28: qty 2

## 2018-06-28 MED ORDER — MIDAZOLAM HCL 2 MG/2ML IJ SOLN
1.0000 mg | INTRAMUSCULAR | Status: DC | PRN
  Administered 2018-06-28: 2 mg via INTRAVENOUS

## 2018-06-28 MED ORDER — CEFAZOLIN SODIUM-DEXTROSE 2-4 GM/100ML-% IV SOLN
2.0000 g | INTRAVENOUS | Status: AC
  Administered 2018-06-28: 3 g via INTRAVENOUS

## 2018-06-28 MED ORDER — LIDOCAINE 2% (20 MG/ML) 5 ML SYRINGE
INTRAMUSCULAR | Status: AC
  Filled 2018-06-28: qty 5

## 2018-06-28 MED ORDER — VANCOMYCIN HCL 500 MG IV SOLR
INTRAVENOUS | Status: DC | PRN
  Administered 2018-06-28: 500 mg via TOPICAL

## 2018-06-28 MED ORDER — CHLORHEXIDINE GLUCONATE 4 % EX LIQD: 60.0000 mL | Freq: Once | CUTANEOUS | Status: DC

## 2018-06-28 SURGICAL SUPPLY — 56 items
BLADE AVERAGE 25X9 (BLADE) IMPLANT
BLADE OSC/SAG .038X5.5 CUT EDG (BLADE) IMPLANT
BLADE SURG 10 STRL SS (BLADE) IMPLANT
BLADE SURG 15 STRL LF DISP TIS (BLADE) ×1 IMPLANT
BLADE SURG 15 STRL SS (BLADE) ×1
BNDG COHESIVE 4X5 TAN STRL (GAUZE/BANDAGES/DRESSINGS) ×2 IMPLANT
BNDG CONFORM 3 STRL LF (GAUZE/BANDAGES/DRESSINGS) IMPLANT
BNDG ESMARK 4X9 LF (GAUZE/BANDAGES/DRESSINGS) ×2 IMPLANT
CHLORAPREP W/TINT 26ML (MISCELLANEOUS) ×2 IMPLANT
COVER BACK TABLE 60X90IN (DRAPES) ×2 IMPLANT
CUFF TOURNIQUET SINGLE 24IN (TOURNIQUET CUFF) IMPLANT
DECANTER SPIKE VIAL GLASS SM (MISCELLANEOUS) IMPLANT
DRAPE EXTREMITY T 121X128X90 (DRAPE) ×2 IMPLANT
DRAPE SURG 17X23 STRL (DRAPES) ×2 IMPLANT
DRAPE U-SHAPE 47X51 STRL (DRAPES) ×2 IMPLANT
DRSG EMULSION OIL 3X3 NADH (GAUZE/BANDAGES/DRESSINGS) IMPLANT
DRSG MEPITEL 4X7.2 (GAUZE/BANDAGES/DRESSINGS) ×2 IMPLANT
DRSG PAD ABDOMINAL 8X10 ST (GAUZE/BANDAGES/DRESSINGS) IMPLANT
ELECT REM PT RETURN 9FT ADLT (ELECTROSURGICAL) ×2
ELECTRODE REM PT RTRN 9FT ADLT (ELECTROSURGICAL) ×1 IMPLANT
GAUZE SPONGE 4X4 12PLY STRL (GAUZE/BANDAGES/DRESSINGS) ×2 IMPLANT
GLOVE BIO SURGEON STRL SZ8 (GLOVE) ×2 IMPLANT
GLOVE BIOGEL PI IND STRL 8 (GLOVE) ×2 IMPLANT
GLOVE BIOGEL PI INDICATOR 8 (GLOVE) ×2
GLOVE ECLIPSE 8.0 STRL XLNG CF (GLOVE) ×2 IMPLANT
GOWN STRL REUS W/ TWL LRG LVL3 (GOWN DISPOSABLE) ×1 IMPLANT
GOWN STRL REUS W/ TWL XL LVL3 (GOWN DISPOSABLE) ×2 IMPLANT
GOWN STRL REUS W/TWL LRG LVL3 (GOWN DISPOSABLE) ×1
GOWN STRL REUS W/TWL XL LVL3 (GOWN DISPOSABLE) ×2
NDL SAFETY ECLIPSE 18X1.5 (NEEDLE) IMPLANT
NEEDLE HYPO 18GX1.5 SHARP (NEEDLE)
NEEDLE HYPO 25X1 1.5 SAFETY (NEEDLE) IMPLANT
NS IRRIG 1000ML POUR BTL (IV SOLUTION) ×2 IMPLANT
PACK BASIN DAY SURGERY FS (CUSTOM PROCEDURE TRAY) ×2 IMPLANT
PAD CAST 4YDX4 CTTN HI CHSV (CAST SUPPLIES) ×1 IMPLANT
PADDING CAST COTTON 4X4 STRL (CAST SUPPLIES) ×1
PENCIL BUTTON HOLSTER BLD 10FT (ELECTRODE) ×2 IMPLANT
SANITIZER HAND PURELL 535ML FO (MISCELLANEOUS) ×2 IMPLANT
SHEET MEDIUM DRAPE 40X70 STRL (DRAPES) ×2 IMPLANT
SLEEVE SCD COMPRESS KNEE MED (MISCELLANEOUS) ×2 IMPLANT
SPONGE LAP 18X18 RF (DISPOSABLE) ×2 IMPLANT
STOCKINETTE 6  STRL (DRAPES) ×1
STOCKINETTE 6 STRL (DRAPES) ×1 IMPLANT
SUCTION FRAZIER HANDLE 10FR (MISCELLANEOUS)
SUCTION TUBE FRAZIER 10FR DISP (MISCELLANEOUS) IMPLANT
SUT ETHILON 2 0 FS 18 (SUTURE) IMPLANT
SUT ETHILON 2 0 FSLX (SUTURE) IMPLANT
SUT ETHILON 3 0 PS 1 (SUTURE) IMPLANT
SUT MNCRL AB 3-0 PS2 18 (SUTURE) IMPLANT
SWAB COLLECTION DEVICE MRSA (MISCELLANEOUS) IMPLANT
SWAB CULTURE ESWAB REG 1ML (MISCELLANEOUS) IMPLANT
SYR BULB 3OZ (MISCELLANEOUS) ×2 IMPLANT
SYR CONTROL 10ML LL (SYRINGE) IMPLANT
TOWEL GREEN STERILE FF (TOWEL DISPOSABLE) ×2 IMPLANT
TUBE CONNECTING 20X1/4 (TUBING) IMPLANT
UNDERPAD 30X30 (UNDERPADS AND DIAPERS) ×2 IMPLANT

## 2018-06-28 NOTE — H&P (Signed)
[name] is an 57 y.o. male.   Chief Complaint: Left foot wound dehiscence HPI: The patient is a 57 year old male with a past medical history significant for diabetes.  He underwent third toe amputation at the PIP joint approximately 4 weeks ago.  He presented to my office 2 days ago.  He was found to have dehiscence of the wound with exposed bone.  He presents now for revision amputation.  Past Medical History:  Diagnosis Date  . Arthritis    hips, hands  . Diverticulitis    portion of colon removed  . Hip pain   . History of ventral hernia repair 2016  . Hypertension   . Neuromuscular disorder (HCC)    peripheral neuropathy  . Pre-diabetes   . Sleep apnea    has OSA-not used CPAP 2-3 yrs    Past Surgical History:  Procedure Laterality Date  . AMPUTATION TOE Left 05/25/2018   Procedure: AMPUTATION TOE left 3rd;  Surgeon: Toni Arthurs, MD;  Location: [facility];  Service: Orthopedics;  Laterality: Left;  , to follow  . HERNIA REPAIR  2016   ventral  . JOINT REPLACEMENT     b/l knees     Family History  Adopted: Yes  Family history unknown: Yes   Social History:  reports that he has quit smoking. He has never used smokeless tobacco. He reports that he drinks alcohol. He reports that he has current or past drug history. Drug: Marijuana. Frequency: 3.00 times per week.  Allergies:  Allergies  Allergen Reactions  . Claritin [Loratadine]     SOB    Medications Prior to Admission  Medication Sig Dispense Refill  . folic acid (FOLVITE) 1 MG tablet Take 1 tablet (1 mg total) by mouth daily. 30 tablet 0  . furosemide (LASIX) 20 MG tablet Take 2 tablets (40 mg total) by mouth daily. 60 tablet 3  . metoprolol tartrate (LOPRESSOR) 100 MG tablet TAKE 1 TABLET (100 MG TOTAL) BY MOUTH 2 (TWO) TIMES DAILY. 180 tablet 3  . Multiple Vitamin (MULTIVITAMIN WITH MINERALS) TABS tablet Take 1 tablet by mouth daily. 30 tablet 0  . saccharomyces boulardii (FLORASTOR) 250  MG capsule Take 1 capsule (250 mg total) by mouth 2 (two) times daily. 30 capsule 0  . thiamine 100 MG tablet Take 1 tablet (100 mg total) by mouth daily. 30 tablet 0  . traMADol (ULTRAM) 50 MG tablet tramadol 50 mg tablet  Take 1 tablet(s) EVERY 6 HOURS as needed for hip pain.    . Wound Dressings (MEDIHONEY WOUND/BURN DRESSING) GEL Apply 1 application topically daily. 1 Tube 0  . Docusate Sodium (STOOL SOFTENER) 100 MG capsule Take 100 mg by mouth 2 (two) times daily as needed for constipation.    . DULoxetine (CYMBALTA) 20 MG capsule TAKE ONE CAPSULE BY MOUTH EVERY DAY 30 capsule 3    Results for orders placed or performed during the hospital encounter of 06/28/18 (from the past 48 hour(s))  I-STAT, chem 8     Status: Abnormal   Collection Time: 06/28/18 11:23 AM  Result Value Ref Range   Sodium 136 135 - 145 mmol/L   Potassium 3.5 3.5 - 5.1 mmol/L   Chloride 97 (L) 98 - 111 mmol/L   BUN 5 (L) 6 - 20 mg/dL   Creatinine, Ser 1.61 0.61 - 1.24 mg/dL   Glucose, Bld 096 (H) 70 - 99 mg/dL   Calcium, Ion 0.45 (L) 1.15 - 1.40 mmol/L   TCO2 24  22 - 32 mmol/L   Hemoglobin 13.3 13.0 - 17.0 g/dL   HCT 63.8 17.7 - 11.6 %   No results found.  ROS no recent fever, chills, nausea, vomiting or changes in his appetite  Blood pressure 135/69, pulse 74, temperature 98.4 F (36.9 C), temperature source Oral, height 6\' 1"  (1.854 m), weight 124.7 kg. Physical Exam  Well-nourished well-developed overweight man in no apparent distress.  Alert and oriented x4.  Mood and affect are normal.  Extraocular motions are intact.  Respirations are unlabored.  Gait is flatfoot flatfoot on the left in a postop shoe with antalgia bilaterally.  Third toe amputation site is open.  Exposed proximal phalanx is noted.  Mild erythema around the toe.  Brisk capillary refill at the toes.  5 out of 5 strength in plantar flexion and dorsiflexion of the ankle.  No lymphadenopathy or lymphangitis.  No drainage at the  wound.  Assessment/Plan Left third toe wound dehiscence status post amputation -to the operating room today for revision to a ray amputation at the third metatarsal.  The risks and benefits of the alternative treatment options have been discussed in detail.  The patient wishes to proceed with surgery and specifically understands risks of bleeding, infection, nerve damage, blood clots, need for additional surgery, amputation and death.   Toni Arthurs, MD 2018/07/10, 12:36 PM

## 2018-06-28 NOTE — Anesthesia Postprocedure Evaluation (Signed)
Anesthesia Post Note  Patient: [name]  Procedure(s) Performed: Left foot revision 3rd toe amputation including 3rd metatarsal (Left Foot)     Patient location during evaluation: PACU Anesthesia Type: MAC Level of consciousness: awake and alert Pain management: pain level controlled Vital Signs Assessment: post-procedure vital signs reviewed and stable Respiratory status: spontaneous breathing, nonlabored ventilation, respiratory function stable and patient connected to nasal cannula oxygen Cardiovascular status: stable and blood pressure returned to baseline Postop Assessment: no apparent nausea or vomiting Anesthetic complications: no    Last Vitals:  Vitals:   06/28/18 1445 06/28/18 1536  BP: 125/78 (!) 155/74  Pulse: 72 81  Resp: 13 16  Temp:  36.8 C  SpO2: 97% 99%    Last Pain:  Vitals:   06/28/18 1536  TempSrc:   PainSc: 0-No pain                 Montez Hageman

## 2018-06-28 NOTE — Anesthesia Preprocedure Evaluation (Signed)
Anesthesia Evaluation  Patient identified by MRN, date of birth, ID band Patient awake    Reviewed: Allergy & Precautions, NPO status , Patient's Chart, lab work & pertinent test results  Airway Mallampati: II  TM Distance: >3 FB Neck ROM: Full    Dental no notable dental hx.    Pulmonary sleep apnea , former smoker,    Pulmonary exam normal breath sounds clear to auscultation       Cardiovascular hypertension, Pt. on medications Normal cardiovascular exam Rhythm:Regular Rate:Normal     Neuro/Psych neuropathy negative psych ROS   GI/Hepatic negative GI ROS, Neg liver ROS,   Endo/Other  diabetes  Renal/GU negative Renal ROS  negative genitourinary   Musculoskeletal negative musculoskeletal ROS (+)   Abdominal   Peds negative pediatric ROS (+)  Hematology negative hematology ROS (+)   Anesthesia Other Findings   Reproductive/Obstetrics negative OB ROS                             Anesthesia Physical Anesthesia Plan  ASA: III  Anesthesia Plan: MAC   Post-op Pain Management:    Induction: Intravenous  PONV Risk Score and Plan: 1 and Ondansetron and Treatment may vary due to age or medical condition  Airway Management Planned: Simple Face Mask  Additional Equipment:   Intra-op Plan:   Post-operative Plan:   Informed Consent: I have reviewed the patients History and Physical, chart, labs and discussed the procedure including the risks, benefits and alternatives for the proposed anesthesia with the patient or authorized representative who has indicated his/her understanding and acceptance.   Dental advisory given  Plan Discussed with: CRNA  Anesthesia Plan Comments:         Anesthesia Quick Evaluation

## 2018-06-28 NOTE — Transfer of Care (Signed)
Immediate Anesthesia Transfer of Care Note  Patient: [name]  Procedure(s) Performed: Left foot revision 3rd toe amputation including 3rd metatarsal (Left Foot)  Patient Location: PACU  Anesthesia Type:MAC  Level of Consciousness: awake, alert  and oriented  Airway & Oxygen Therapy: Patient Spontanous Breathing and Patient connected to face mask oxygen  Post-op Assessment: Report given to RN and Post -op Vital signs reviewed and stable  Post vital signs: Reviewed and stable  Last Vitals:  Vitals Value Taken Time  BP 137/71 06/28/2018  1:30 PM  Temp    Pulse 73 06/28/2018  1:31 PM  Resp 24 06/28/2018  1:31 PM  SpO2 100 % 06/28/2018  1:31 PM  Vitals shown include unvalidated device data.  Last Pain:  Vitals:   06/28/18 1109  TempSrc: Oral  PainSc: 10-Worst pain ever         Complications: No apparent anesthesia complications

## 2018-06-28 NOTE — Op Note (Signed)
06/28/2018  1:29 PM  PATIENT:  [name]  57 y.o. male  PRE-OPERATIVE DIAGNOSIS:  Dehiscence of surgical wound left foot 3rd toe and osteomylitis  POST-OPERATIVE DIAGNOSIS:  same  Procedure(s):  Left foot 3rd ray amputation  SURGEON:  Wylene Simmer, MD  ASSISTANT: none  ANESTHESIA:   Local, mac  EBL:  minimal   TOURNIQUET:  approx 15 min with ankle esmarch  COMPLICATIONS:  None apparent  DISPOSITION:  Extubated, awake and stable to recovery.  INDICATION FOR PROCEDURE: The patient is a 57 year old male with a past medical history significant for diabetes.  He is 4 weeks out now from left third toe amputation for a chronic ulcer.  2 days ago he presented to clinic with dehiscence of the wound and exposed proximal phalanx.  He presents now for revision of the toe amputation to a third ray amputation.  The risks and benefits of the alternative treatment options have been discussed in detail.  The patient wishes to proceed with surgery and specifically understands risks of bleeding, infection, nerve damage, blood clots, need for additional surgery, amputation and death.  PROCEDURE IN DETAIL:  After pre operative consent was obtained, and the correct operative site was identified, the patient was brought to the operating room and placed supine on the OR table.  Anesthesia was administered.  Pre-operative antibiotics were administered.  A surgical timeout was taken.  The left lower externally was prepped and draped in standard sterile fashion.  A metatarsal block was performed with half percent Marcaine with epinephrine.  The foot was exsanguinated and a 4 inch Esmarch tourniquet wrapped around the ankle.  A racquet style incision was made around the base of the toe.  Dissection was carried down through the subcutaneous tissues.  All of the necrotic soft tissue at the amputation site was excised.  The toe was disarticulated through the MTP joint and passed off the field.  The metatarsal was  exposed.  An oscillating saw was used to cut through the metatarsal shaft.  The distal portion of the metatarsal was removed.  Neurovascular bundles were cauterized.  The wound was irrigated copiously.  All of the remaining soft tissue appeared healthy with no evidence of infection.  Vancomycin powder was sprinkled in the wound.  2-0 nylon sutures were used to close the skin incision.  Sterile dressings were applied followed by a compression wrap.  The tourniquet was released after application of the dressings.  The patient was awakened from anesthesia and transported to the recovery room in stable condition.   FOLLOW UP PLAN: Weightbearing as tolerated in a flat postop shoe.  Follow-up in the office in 2 weeks for wound check.  Suture removal will likely be delayed until 6 weeks postop.

## 2018-06-28 NOTE — Discharge Instructions (Addendum)
Toni Arthurs, MD [facility]  Please read the following information regarding your care after surgery.  Medications  You only need a prescription for the narcotic pain medicine (ex. oxycodone, Percocet, Norco).  All of the other medicines listed below are available over the counter. X Aleve 2 pills twice a day for the first 3 days after surgery. X Norco as prescribed for severe pain  Narcotic pain medicine (ex. oxycodone, Percocet, Vicodin) will cause constipation.  To prevent this problem, take the following medicines while you are taking any pain medicine. X docusate sodium (Colace) 100 mg twice a day X senna (Senokot) 2 tablets twice a day  Weight Bearing X Bear weight only on your operated foot in the post-op shoe.   Cast / Splint / Dressing X Keep your splint, cast or dressing clean and dry.  Dont put anything (coat hanger, pencil, etc) down inside of it.  If it gets damp, use a hair dryer on the cool setting to dry it.  If it gets soaked, call the office to schedule an appointment for a cast change.  After your dressing, cast or splint is removed; you may shower, but do not soak or scrub the wound.  Allow the water to run over it, and then gently pat it dry.  Swelling It is normal for you to have swelling where you had surgery.  To reduce swelling and pain, keep your toes above your nose for at least 3 days after surgery.  It may be necessary to keep your foot or leg elevated for several weeks.  If it hurts, it should be elevated.  Follow Up Call my office at [phone] when you are discharged from the hospital or surgery center to schedule an appointment to be seen two weeks after surgery.  Call my office at [phone] if you develop a fever >101.5 F, nausea, vomiting, bleeding from the surgical site or severe pain.        Post Anesthesia Home Care Instructions  Activity: Get plenty of rest for the remainder of the day. A responsible individual must stay  with you for 24 hours following the procedure.  For the next 24 hours, DO NOT: -Drive a car -Advertising copywriter -Drink alcoholic beverages -Take any medication unless instructed by your physician -Make any legal decisions or sign important papers.  Meals: Start with liquid foods such as gelatin or soup. Progress to regular foods as tolerated. Avoid greasy, spicy, heavy foods. If nausea and/or vomiting occur, drink only clear liquids until the nausea and/or vomiting subsides. Call your physician if vomiting continues.  Special Instructions/Symptoms: Your throat may feel dry or sore from the anesthesia or the breathing tube placed in your throat during surgery. If this causes discomfort, gargle with warm salt water. The discomfort should disappear within 24 hours.  If you had a scopolamine patch placed behind your ear for the management of post- operative nausea and/or vomiting:  1. The medication in the patch is effective for 72 hours, after which it should be removed.  Wrap patch in a tissue and discard in the trash. Wash hands thoroughly with soap and water. 2. You may remove the patch earlier than 72 hours if you experience unpleasant side effects which may include dry mouth, dizziness or visual disturbances. 3. Avoid touching the patch. Wash your hands with soap and water after contact with the patch.

## 2018-06-29 ENCOUNTER — Encounter (HOSPITAL_BASED_OUTPATIENT_CLINIC_OR_DEPARTMENT_OTHER): Payer: Self-pay | Admitting: Orthopedic Surgery

## 2018-06-29 NOTE — Progress Notes (Signed)
Subjective:    Patient ID: [name], male    DOB: 07-23-1961, 57 y.o.   MRN: [id number]  HPI The patient is here for follow up and for pre-op visit.  He is here on the request of Dr. Linna Caprice with the [facility] for preoperative risk evaluation for right total hip replacement planned for 07/13/2018.  He will have spinal anesthesia.  He is failed conservative treatments and surgery is the only option.  He has had surgery recently and surgeries in the past and has not had any issues with anesthesia.  He does not have a history of bleeding problems or blood clots.  Hypertension: He is taking his medication daily.  On occasion he has taken to metoprolol.  He does not monitor his blood pressure at home, but has been elevated on occasion at other doctor's offices.  He is compliant with a low sodium diet.  He occasionally has some shortness of breath with exertion, but at this time because of the hip pain and his foot issues is not able to exercise.  He uses a walker at home.  He does have chronic leg edema that is controlled.  He denies chest pain, palpitations,and regular headaches.     Prediabetes:  He is compliant with a low sugar/carbohydrate diet.  He is not currently able to exercise because of his chronic hip pain and foot issues/surgeries.   Leg edema:  He is taking 20-40 mg of lasix daily.  He occasionally skips a day of Lasix because of the difficulty going to the bathroom so much.  Toe ulcer, left foot:  He had a third toe amputation at the PIP joint one month ago.  He had dehiscence of the wound with exposed bone and had a revision of the amputation two days ago - ray amputation at the thrid metatarsal.     Medications and allergies reviewed with patient and updated if appropriate.  Patient Active Problem List   Diagnosis Date Noted  . Bilateral leg edema 02/27/2018  . Diabetic foot infection (HCC) 02/16/2018  . Alcohol dependence syndrome (HCC) 02/16/2018  . Marijuana abuse  02/16/2018  . Diabetic ulcer of right midfoot associated with type 2 diabetes mellitus, with necrosis of muscle (HCC) 11/16/2017  . Foot ulcer (HCC) 12/21/2016  . Bilateral carpal tunnel syndrome 12/15/2016  . Fatty liver 12/14/2016  . Hyperlipidemia 11/25/2016  . Elevated liver enzymes 11/25/2016  . Prediabetes 11/25/2016  . Hypertension 11/23/2016  . Neuropathy 11/23/2016  . Toe amputation status, right (HCC) 11/23/2016  . Morbid obesity (HCC) 11/23/2016  . OSA (obstructive sleep apnea) 11/23/2016  . Other hammer toe (acquired) 11/11/2013  . Exstrophy of bladder 11/11/2013    Current Outpatient Medications on File Prior to Visit  Medication Sig Dispense Refill  . cephALEXin (KEFLEX) 500 MG capsule Take 500 mg by mouth 4 (four) times daily.    Tery Sanfilippo Sodium (STOOL SOFTENER) 100 MG capsule Take 100 mg by mouth 2 (two) times daily as needed for constipation.    . DULoxetine (CYMBALTA) 20 MG capsule TAKE ONE CAPSULE BY MOUTH EVERY DAY 30 capsule 3  . folic acid (FOLVITE) 1 MG tablet Take 1 tablet (1 mg total) by mouth daily. 30 tablet 0  . furosemide (LASIX) 20 MG tablet Take 2 tablets (40 mg total) by mouth daily. 60 tablet 3  . HYDROcodone-acetaminophen (NORCO/VICODIN) 5-325 MG tablet Take 1 tablet by mouth every 6 (six) hours as needed for severe pain. 10 tablet 0  .  loperamide (IMODIUM) 2 MG capsule Take by mouth as needed for diarrhea or loose stools.    . metoprolol tartrate (LOPRESSOR) 100 MG tablet TAKE 1 TABLET (100 MG TOTAL) BY MOUTH 2 (TWO) TIMES DAILY. 180 tablet 3  . Multiple Vitamin (MULTIVITAMIN WITH MINERALS) TABS tablet Take 1 tablet by mouth daily. 30 tablet 0  . saccharomyces boulardii (FLORASTOR) 250 MG capsule Take 1 capsule (250 mg total) by mouth 2 (two) times daily. 30 capsule 0  . thiamine 100 MG tablet Take 1 tablet (100 mg total) by mouth daily. 30 tablet 0  . traMADol (ULTRAM) 50 MG tablet tramadol 50 mg tablet  Take 1 tablet(s) EVERY 6 HOURS as needed for  hip pain.     No current facility-administered medications on file prior to visit.     Past Medical History:  Diagnosis Date  . Arthritis    hips, hands  . Diverticulitis    portion of colon removed  . Hip pain   . History of ventral hernia repair 2016  . Hypertension   . Neuromuscular disorder (HCC)    peripheral neuropathy  . Pre-diabetes   . Sleep apnea    has OSA-not used CPAP 2-3 yrs    Past Surgical History:  Procedure Laterality Date  . AMPUTATION TOE Left 05/25/2018   Procedure: AMPUTATION TOE left 3rd;  Surgeon: Toni ArthursHewitt, John, MD;  Location: [facility];  Service: Orthopedics;  Laterality: Left;  60min, to follow  . AMPUTATION TOE Left 06/28/2018   Procedure: Left foot revision 3rd toe amputation including 3rd metatarsal;  Surgeon: Toni ArthursHewitt, John, MD;  Location: [facility];  Service: Orthopedics;  Laterality: Left;  60min  . HERNIA REPAIR  2016   ventral  . JOINT REPLACEMENT     b/l knees     Social History   Socioeconomic History  . Marital status: Divorced    Spouse name: Not on file  . Number of children: Not on file  . Years of education: Not on file  . Highest education level: Not on file  Occupational History  . Occupation: unemployed, Press photographerfiling for disability  Social Needs  . Financial resource strain: Not on file  . Food insecurity:    Worry: Not on file    Inability: Not on file  . Transportation needs:    Medical: Not on file    Non-medical: Not on file  Tobacco Use  . Smoking status: Former Games developermoker  . Smokeless tobacco: Never Used  Substance and Sexual Activity  . Alcohol use: Yes    Comment: 2 liquor drinks /night  . Drug use: Yes    Frequency: 3.0 times per week    Types: Marijuana    Comment: occ last smoked 3-4 mos ago  . Sexual activity: Not on file  Lifestyle  . Physical activity:    Days per week: Not on file    Minutes per session: Not on file  . Stress: Not on file  Relationships  . Social connections:     Talks on phone: Not on file    Gets together: Not on file    Attends religious service: Not on file    Active member of club or organization: Not on file    Attends meetings of clubs or organizations: Not on file    Relationship status: Not on file  Other Topics Concern  . Not on file  Social History Narrative  . Not on file    Family History  Adopted:  Yes  Family history unknown: Yes    Review of Systems  Constitutional: Positive for chills (couple of days). Negative for fever.  Eyes: Positive for visual disturbance (blurry vision when he first wakes up - the goes away).  Respiratory: Positive for cough (in morning only - chronic) and shortness of breath (At times with exertion). Negative for wheezing.   Cardiovascular: Positive for leg swelling. Negative for chest pain and palpitations.  Gastrointestinal: Positive for constipation (occ) and diarrhea (occ). Negative for abdominal pain, blood in stool and nausea.  Genitourinary: Negative for difficulty urinating, dysuria and hematuria.  Neurological: Positive for light-headedness (after sitting for a while and gets up - transient). Negative for headaches.       Objective:   Vitals:   06/30/18 1541  BP: 134/82  Pulse: 73  Resp: 18  Temp: 99 F (37.2 C)  SpO2: 95%   BP Readings from Last 3 Encounters:  06/30/18 134/82  06/28/18 (!) 155/74  05/25/18 (!) 170/97   Wt Readings from Last 3 Encounters:  06/28/18 275 lb (124.7 kg)  05/25/18 280 lb (127 kg)  04/02/18 290 lb (131.5 kg)   Body mass index is 36.28 kg/m.   Physical Exam    Constitutional: Appears well-developed and well-nourished. No distress.  HENT:  Head: Normocephalic and atraumatic.  Neck: Neck supple. No tracheal deviation present. No thyromegaly present.  No cervical lymphadenopathy Cardiovascular: Normal rate, regular rhythm and normal heart sounds.   No murmur heard. No carotid bruit .  Both legs wrapped and unable to evaluate for  edema Pulmonary/Chest: Effort normal and breath sounds normal. No respiratory distress. No has no wheezes. No rales. Abdomen: Soft, nontender, nondistended, obese Skin: Skin is warm and dry. Not diaphoretic.  Psychiatric: Normal mood and affect. Behavior is normal.    EKG: 05/25/2018: Normal sinus rhythm at 65 bpm, normal EKG  Assessment & Plan:    See Problem List for Assessment and Plan of chronic medical problems.

## 2018-06-30 ENCOUNTER — Other Ambulatory Visit (INDEPENDENT_AMBULATORY_CARE_PROVIDER_SITE_OTHER): Payer: BLUE CROSS/BLUE SHIELD

## 2018-06-30 ENCOUNTER — Ambulatory Visit: Payer: BLUE CROSS/BLUE SHIELD | Admitting: Internal Medicine

## 2018-06-30 ENCOUNTER — Encounter: Payer: Self-pay | Admitting: Internal Medicine

## 2018-06-30 VITALS — BP 134/82 | HR 73 | Temp 99.0°F | Resp 18 | Ht 73.0 in

## 2018-06-30 DIAGNOSIS — I10 Essential (primary) hypertension: Secondary | ICD-10-CM | POA: Diagnosis not present

## 2018-06-30 DIAGNOSIS — S98131A Complete traumatic amputation of one right lesser toe, initial encounter: Secondary | ICD-10-CM

## 2018-06-30 DIAGNOSIS — R600 Localized edema: Secondary | ICD-10-CM | POA: Diagnosis not present

## 2018-06-30 DIAGNOSIS — Z01818 Encounter for other preprocedural examination: Secondary | ICD-10-CM | POA: Diagnosis not present

## 2018-06-30 DIAGNOSIS — IMO0002 Reserved for concepts with insufficient information to code with codable children: Secondary | ICD-10-CM

## 2018-06-30 DIAGNOSIS — R7303 Prediabetes: Secondary | ICD-10-CM | POA: Diagnosis not present

## 2018-06-30 LAB — COMPREHENSIVE METABOLIC PANEL
ALT: 61 U/L — AB (ref 0–53)
AST: 66 U/L — AB (ref 0–37)
Albumin: 3.7 g/dL (ref 3.5–5.2)
Alkaline Phosphatase: 121 U/L — ABNORMAL HIGH (ref 39–117)
BILIRUBIN TOTAL: 1 mg/dL (ref 0.2–1.2)
BUN: 8 mg/dL (ref 6–23)
CHLORIDE: 97 meq/L (ref 96–112)
CO2: 24 meq/L (ref 19–32)
CREATININE: 0.64 mg/dL (ref 0.40–1.50)
Calcium: 8.8 mg/dL (ref 8.4–10.5)
GFR: 137.03 mL/min (ref >60.00)
GLUCOSE: 113 mg/dL — AB (ref 70–99)
Potassium: 3.1 mEq/L — ABNORMAL LOW (ref 3.5–5.1)
SODIUM: 137 meq/L (ref 135–145)
Total Protein: 7.3 g/dL (ref 6.0–8.3)

## 2018-06-30 LAB — HEMOGLOBIN A1C: HEMOGLOBIN A1C: 5.8 % (ref 4.6–6.5)

## 2018-06-30 MED ORDER — LOSARTAN POTASSIUM 50 MG PO TABS: 50.0000 mg | ORAL_TABLET | Freq: Every day | ORAL | 3 refills | Status: DC

## 2018-06-30 MED ORDER — METFORMIN HCL 500 MG PO TABS: 500.0000 mg | ORAL_TABLET | Freq: Two times a day (BID) | ORAL | 3 refills | Status: DC

## 2018-06-30 NOTE — Assessment & Plan Note (Signed)
His A1c here has never been in the diabetic range so I still classify him as a prediabetic He does have insulin resistance Will start metformin preventatively Will check A1c, CMP today

## 2018-06-30 NOTE — Patient Instructions (Addendum)
  Test(s) ordered today. Your results will be released to MyChart (or called to you) after review, usually within 72hours after test completion. If any changes need to be made, you will be notified at that same time.   Medications reviewed and updated.  Changes include starting metformin 500 mg twice a day.  We will also start losartan for your BP.   Monitor your BP at home.   Your prescription(s) have been submitted to your pharmacy. Please take as directed and contact our office if you believe you are having problem(s) with the medication(s).    Please followup in 6 months

## 2018-06-30 NOTE — Assessment & Plan Note (Signed)
At the request of Dr. Linna CapriceSwinteck from the [facility] for proposed right hip replacements with spinal anesthesia, 07/13/2018 He does not have any concerning symptoms of cardiac disease or pulmonary disease.  He does have some shortness of breath with exertion, which is related to deconditioning and sedentary lifestyle Normal EKG last month Consider him low risk

## 2018-06-30 NOTE — Assessment & Plan Note (Signed)
Blood pressure variable He will get a cuff and start monitoring at home Continue metoprolol 100 mg twice daily Start losartan 50 mg daily Also on Lasix for leg edema CMP today Follow-up in 6 months, sooner if needed

## 2018-06-30 NOTE — Assessment & Plan Note (Signed)
Continue Lasix 20-40 mg daily, okay to give occasionally skip a day CMP

## 2018-07-01 ENCOUNTER — Encounter: Payer: Self-pay | Admitting: Internal Medicine

## 2018-07-08 ENCOUNTER — Other Ambulatory Visit: Payer: Self-pay | Admitting: Internal Medicine

## 2018-07-26 ENCOUNTER — Other Ambulatory Visit: Payer: Self-pay | Admitting: Internal Medicine

## 2018-08-08 ENCOUNTER — Ambulatory Visit: Payer: Self-pay | Admitting: Orthopedic Surgery

## 2018-08-08 DIAGNOSIS — M87051 Idiopathic aseptic necrosis of right femur: Secondary | ICD-10-CM | POA: Diagnosis not present

## 2018-08-08 NOTE — H&P (Signed)
TOTAL HIP ADMISSION H&P  Patient is admitted for right total hip arthroplasty.  Subjective:  Chief Complaint: right hip pain  HPI: [name], 57 y.o. male, has a history of pain and functional disability in the right hip(s) due to AVN and patient has failed non-surgical conservative treatments for greater than 12 weeks to include NSAID's and/or analgesics, flexibility and strengthening excercises, use of assistive devices, weight reduction as appropriate and activity modification.  Onset of symptoms was abrupt starting 3 years ago with rapidlly worsening course since that time.The patient noted no past surgery on the right hip(s).  Patient currently rates pain in the right hip at 10 out of 10 with activity. Patient has night pain, worsening of pain with activity and weight bearing, trendelenberg gait, pain that interfers with activities of daily living, pain with passive range of motion and crepitus. Patient has evidence of subchondral cysts, subchondral sclerosis, periarticular osteophytes, joint subluxation and joint space narrowing by imaging studies. This condition presents safety issues increasing the risk of falls.  There is no current active infection.  Patient Active Problem List   Diagnosis Date Noted  . Preoperative examination 06/30/2018  . Bilateral leg edema 02/27/2018  . Diabetic foot infection (HCC) 02/16/2018  . Alcohol dependence syndrome (HCC) 02/16/2018  . Marijuana abuse 02/16/2018  . Diabetic ulcer of right midfoot associated with type 2 diabetes mellitus, with necrosis of muscle (HCC) 11/16/2017  . Foot ulcer (HCC) 12/21/2016  . Bilateral carpal tunnel syndrome 12/15/2016  . Fatty liver 12/14/2016  . Hyperlipidemia 11/25/2016  . Elevated liver enzymes 11/25/2016  . Prediabetes 11/25/2016  . Hypertension 11/23/2016  . Neuropathy 11/23/2016  . Toe amputation status, right 11/23/2016  . Morbid obesity (HCC) 11/23/2016  . OSA (obstructive sleep apnea) 11/23/2016  .  Other hammer toe (acquired) 11/11/2013  . Exstrophy of bladder 11/11/2013   Past Medical History:  Diagnosis Date  . Arthritis    hips, hands  . Diverticulitis    portion of colon removed  . Hip pain   . History of ventral hernia repair 2016  . Hypertension   . Neuromuscular disorder (HCC)    peripheral neuropathy  . Pre-diabetes   . Sleep apnea    has OSA-not used CPAP 2-3 yrs    Past Surgical History:  Procedure Laterality Date  . AMPUTATION TOE Left 05/25/2018   Procedure: AMPUTATION TOE left 3rd;  Surgeon: Hewitt, John, MD;  Location: [facility];  Service: Orthopedics;  Laterality: Left;  60min, to follow  . AMPUTATION TOE Left 06/28/2018   Procedure: Left foot revision 3rd toe amputation including 3rd metatarsal;  Surgeon: Hewitt, John, MD;  Location: [facility];  Service: Orthopedics;  Laterality: Left;  60min  . HERNIA REPAIR  2016   ventral  . JOINT REPLACEMENT     b/l knees     Current Outpatient Medications  Medication Sig Dispense Refill Last Dose  . acetaminophen (TYLENOL) 650 MG CR tablet Take 1,300-1,950 mg by mouth every 8 (eight) hours as needed for pain.     . calcium carbonate (TUMS - DOSED IN MG ELEMENTAL CALCIUM) 500 MG chewable tablet Chew 2 tablets by mouth daily as needed for indigestion or heartburn.     . diphenhydrAMINE (BENADRYL) 25 MG tablet Take 25 mg by mouth at bedtime as needed for sleep.     . Docusate Sodium (STOOL SOFTENER) 100 MG capsule Take 100 mg by mouth 2 (two) times daily as needed for constipation.   Taking  .   DULoxetine (CYMBALTA) 20 MG capsule TAKE ONE CAPSULE BY MOUTH EVERY DAY (Patient not taking: No sig reported) 30 capsule 3 Not Taking at Unknown time  . folic acid (FOLVITE) 1 MG tablet Take 1 tablet (1 mg total) by mouth daily. (Patient not taking: Reported on 08/07/2018) 30 tablet 0 Not Taking at Unknown time  . furosemide (LASIX) 20 MG tablet Take 2 tablets (40 mg total) by mouth daily. 180 tablet 1    . HYDROcodone-acetaminophen (NORCO/VICODIN) 5-325 MG tablet Take 1 tablet by mouth every 6 (six) hours as needed for severe pain. (Patient not taking: Reported on 08/07/2018) 10 tablet 0 Completed Course at Unknown time  . losartan (COZAAR) 50 MG tablet Take 1 tablet (50 mg total) by mouth daily. 90 tablet 3   . Menthol, Topical Analgesic, (BLUE-EMU MAXIMUM STRENGTH EX) Apply 1 application topically 2 (two) times daily as needed (for pain).     . metFORMIN (GLUCOPHAGE) 500 MG tablet Take 1 tablet (500 mg total) by mouth 2 (two) times daily with a meal. 180 tablet 3   . metoprolol tartrate (LOPRESSOR) 100 MG tablet TAKE 1 TABLET (100 MG TOTAL) BY MOUTH 2 (TWO) TIMES DAILY. 180 tablet 3 Taking  . Multiple Vitamin (MULTIVITAMIN WITH MINERALS) TABS tablet Take 1 tablet by mouth daily. (Patient not taking: Reported on 08/07/2018) 30 tablet 0 Not Taking at Unknown time  . OVER THE COUNTER MEDICATION Apply 1 application topically daily. Hemp Extract Cream     . saccharomyces boulardii (FLORASTOR) 250 MG capsule Take 1 capsule (250 mg total) by mouth 2 (two) times daily. (Patient not taking: Reported on 08/07/2018) 30 capsule 0 Not Taking at Unknown time  . thiamine 100 MG tablet Take 1 tablet (100 mg total) by mouth daily. (Patient not taking: Reported on 08/07/2018) 30 tablet 0 Not Taking at Unknown time  . traMADol (ULTRAM) 50 MG tablet Take 50 mg by mouth every 6 (six) hours as needed for moderate pain.    Taking   No current facility-administered medications for this visit.    Allergies  Allergen Reactions  . Claritin [Loratadine] Shortness Of Breath    Social History   Tobacco Use  . Smoking status: Former Smoker  . Smokeless tobacco: Never Used  Substance Use Topics  . Alcohol use: Yes    Comment: 2 liquor drinks /night    Family History  Adopted: Yes  Family history unknown: Yes     Review of Systems  Constitutional: Positive for chills.  HENT: Negative.   Eyes: Negative.    Respiratory: Negative.   Cardiovascular: Negative.   Gastrointestinal: Negative.   Genitourinary: Negative.   Musculoskeletal: Negative.   Skin: Negative.   Neurological: Positive for dizziness.  Endo/Heme/Allergies: Negative.   Psychiatric/Behavioral: Negative.     Objective:  Physical Exam  Vitals reviewed. Constitutional: He is oriented to person, place, and time. He appears well-developed.  HENT:  Head: Normocephalic and atraumatic.  Eyes: Pupils are equal, round, and reactive to light. Conjunctivae and EOM are normal.  Neck: Normal range of motion. Neck supple.  Cardiovascular: Normal rate, regular rhythm and intact distal pulses.  Respiratory: Effort normal. No respiratory distress.  GI: Soft. He exhibits no distension.  Genitourinary:  Genitourinary Comments: deferred  Musculoskeletal:       Right hip: He exhibits decreased range of motion, decreased strength, bony tenderness and crepitus.  Neurological: He is alert and oriented to person, place, and time. He has normal reflexes.  Skin: Skin is warm and dry.    Psychiatric: He has a normal mood and affect. His behavior is normal. Judgment and thought content normal.    Vital signs in last 24 hours: @VSRANGES@  Labs:   Estimated body mass index is 36.28 kg/m as calculated from the following:   Height as of 06/30/18: 6' 1" (1.854 m).   Weight as of 06/28/18: 124.7 kg.   Imaging Review Plain radiographs demonstrate severe degenerative joint disease of the right hip(s). The bone quality appears to be poor for age and reported activity level.    Preoperative templating of the joint replacement has been completed, documented, and submitted to the Operating Room personnel in order to optimize intra-operative equipment management.     Assessment/Plan:  AVN with collapse, right hip(s)  The patient history, physical examination, clinical judgement of the provider and imaging studies are consistent with end stage  degenerative joint disease of the right hip(s) and total hip arthroplasty is deemed medically necessary. The treatment options including medical management, injection therapy, arthroscopy and arthroplasty were discussed at length. The risks and benefits of total hip arthroplasty were presented and reviewed. The risks due to aseptic loosening, infection, stiffness, dislocation/subluxation,  thromboembolic complications and other imponderables were discussed.  The patient acknowledged the explanation, agreed to proceed with the plan and consent was signed. Patient is being admitted for inpatient treatment for surgery, pain control, PT, OT, prophylactic antibiotics, VTE prophylaxis, progressive ambulation and ADL's and discharge planning.The patient is planning to be discharged home with home health services 

## 2018-08-08 NOTE — H&P (View-Only) (Signed)
TOTAL HIP ADMISSION H&P  Patient is admitted for right total hip arthroplasty.  Subjective:  Chief Complaint: right hip pain  HPI: [name], 57 y.o. male, has a history of pain and functional disability in the right hip(s) due to AVN and patient has failed non-surgical conservative treatments for greater than 12 weeks to include NSAID's and/or analgesics, flexibility and strengthening excercises, use of assistive devices, weight reduction as appropriate and activity modification.  Onset of symptoms was abrupt starting 3 years ago with rapidlly worsening course since that time.The patient noted no past surgery on the right hip(s).  Patient currently rates pain in the right hip at 10 out of 10 with activity. Patient has night pain, worsening of pain with activity and weight bearing, trendelenberg gait, pain that interfers with activities of daily living, pain with passive range of motion and crepitus. Patient has evidence of subchondral cysts, subchondral sclerosis, periarticular osteophytes, joint subluxation and joint space narrowing by imaging studies. This condition presents safety issues increasing the risk of falls.  There is no current active infection.  Patient Active Problem List   Diagnosis Date Noted  . Preoperative examination 06/30/2018  . Bilateral leg edema 02/27/2018  . Diabetic foot infection (HCC) 02/16/2018  . Alcohol dependence syndrome (HCC) 02/16/2018  . Marijuana abuse 02/16/2018  . Diabetic ulcer of right midfoot associated with type 2 diabetes mellitus, with necrosis of muscle (HCC) 11/16/2017  . Foot ulcer (HCC) 12/21/2016  . Bilateral carpal tunnel syndrome 12/15/2016  . Fatty liver 12/14/2016  . Hyperlipidemia 11/25/2016  . Elevated liver enzymes 11/25/2016  . Prediabetes 11/25/2016  . Hypertension 11/23/2016  . Neuropathy 11/23/2016  . Toe amputation status, right 11/23/2016  . Morbid obesity (HCC) 11/23/2016  . OSA (obstructive sleep apnea) 11/23/2016  .  Other hammer toe (acquired) 11/11/2013  . Exstrophy of bladder 11/11/2013   Past Medical History:  Diagnosis Date  . Arthritis    hips, hands  . Diverticulitis    portion of colon removed  . Hip pain   . History of ventral hernia repair 2016  . Hypertension   . Neuromuscular disorder (HCC)    peripheral neuropathy  . Pre-diabetes   . Sleep apnea    has OSA-not used CPAP 2-3 yrs    Past Surgical History:  Procedure Laterality Date  . AMPUTATION TOE Left 05/25/2018   Procedure: AMPUTATION TOE left 3rd;  Surgeon: Toni Arthurs, MD;  Location: [facility];  Service: Orthopedics;  Laterality: Left;  , to follow  . AMPUTATION TOE Left 06/28/2018   Procedure: Left foot revision 3rd toe amputation including 3rd metatarsal;  Surgeon: Toni Arthurs, MD;  Location:  SURGERY CENTER;  Service: Orthopedics;  Laterality: Left;   . HERNIA REPAIR  2016   ventral  . JOINT REPLACEMENT     b/l knees     Current Outpatient Medications  Medication Sig Dispense Refill Last Dose  . acetaminophen (TYLENOL) 650 MG CR tablet Take 1,300-1,950 mg by mouth every 8 (eight) hours as needed for pain.     . calcium carbonate (TUMS - DOSED IN MG ELEMENTAL CALCIUM) 500 MG chewable tablet Chew 2 tablets by mouth daily as needed for indigestion or heartburn.     . diphenhydrAMINE (BENADRYL) 25 MG tablet Take 25 mg by mouth at bedtime as needed for sleep.     Tery Sanfilippo Sodium (STOOL SOFTENER) 100 MG capsule Take 100 mg by mouth 2 (two) times daily as needed for constipation.   Taking  .  DULoxetine (CYMBALTA) 20 MG capsule TAKE ONE CAPSULE BY MOUTH EVERY DAY (Patient not taking: No sig reported) 30 capsule 3 Not Taking at Unknown time  . folic acid (FOLVITE) 1 MG tablet Take 1 tablet (1 mg total) by mouth daily. (Patient not taking: Reported on 08/07/2018) 30 tablet 0 Not Taking at Unknown time  . furosemide (LASIX) 20 MG tablet Take 2 tablets (40 mg total) by mouth daily. 180 tablet 1    . HYDROcodone-acetaminophen (NORCO/VICODIN) 5-325 MG tablet Take 1 tablet by mouth every 6 (six) hours as needed for severe pain. (Patient not taking: Reported on 08/07/2018) 10 tablet 0 Completed Course at Unknown time  . losartan (COZAAR) 50 MG tablet Take 1 tablet (50 mg total) by mouth daily. 90 tablet 3   . Menthol, Topical Analgesic, (BLUE-EMU MAXIMUM STRENGTH EX) Apply 1 application topically 2 (two) times daily as needed (for pain).     . metFORMIN (GLUCOPHAGE) 500 MG tablet Take 1 tablet (500 mg total) by mouth 2 (two) times daily with a meal. 180 tablet 3   . metoprolol tartrate (LOPRESSOR) 100 MG tablet TAKE 1 TABLET (100 MG TOTAL) BY MOUTH 2 (TWO) TIMES DAILY. 180 tablet 3 Taking  . Multiple Vitamin (MULTIVITAMIN WITH MINERALS) TABS tablet Take 1 tablet by mouth daily. (Patient not taking: Reported on 08/07/2018) 30 tablet 0 Not Taking at Unknown time  . OVER THE COUNTER MEDICATION Apply 1 application topically daily. Hemp Extract Cream     . saccharomyces boulardii (FLORASTOR) 250 MG capsule Take 1 capsule (250 mg total) by mouth 2 (two) times daily. (Patient not taking: Reported on 08/07/2018) 30 capsule 0 Not Taking at Unknown time  . thiamine 100 MG tablet Take 1 tablet (100 mg total) by mouth daily. (Patient not taking: Reported on 08/07/2018) 30 tablet 0 Not Taking at Unknown time  . traMADol (ULTRAM) 50 MG tablet Take 50 mg by mouth every 6 (six) hours as needed for moderate pain.    Taking   No current facility-administered medications for this visit.    Allergies  Allergen Reactions  . Claritin [Loratadine] Shortness Of Breath    Social History   Tobacco Use  . Smoking status: Former Games developer  . Smokeless tobacco: Never Used  Substance Use Topics  . Alcohol use: Yes    Comment: 2 liquor drinks /night    Family History  Adopted: Yes  Family history unknown: Yes     Review of Systems  Constitutional: Positive for chills.  HENT: Negative.   Eyes: Negative.    Respiratory: Negative.   Cardiovascular: Negative.   Gastrointestinal: Negative.   Genitourinary: Negative.   Musculoskeletal: Negative.   Skin: Negative.   Neurological: Positive for dizziness.  Endo/Heme/Allergies: Negative.   Psychiatric/Behavioral: Negative.     Objective:  Physical Exam  Vitals reviewed. Constitutional: He is oriented to person, place, and time. He appears well-developed.  HENT:  Head: Normocephalic and atraumatic.  Eyes: Pupils are equal, round, and reactive to light. Conjunctivae and EOM are normal.  Neck: Normal range of motion. Neck supple.  Cardiovascular: Normal rate, regular rhythm and intact distal pulses.  Respiratory: Effort normal. No respiratory distress.  GI: Soft. He exhibits no distension.  Genitourinary:  Genitourinary Comments: deferred  Musculoskeletal:       Right hip: He exhibits decreased range of motion, decreased strength, bony tenderness and crepitus.  Neurological: He is alert and oriented to person, place, and time. He has normal reflexes.  Skin: Skin is warm and dry.  Psychiatric: He has a normal mood and affect. His behavior is normal. Judgment and thought content normal.    Vital signs in last 24 hours: @VSRANGES @  Labs:   Estimated body mass index is 36.28 kg/m as calculated from the following:   Height as of 06/30/18: 6\' 1"  (1.854 m).   Weight as of 06/28/18: 124.7 kg.   Imaging Review Plain radiographs demonstrate severe degenerative joint disease of the right hip(s). The bone quality appears to be poor for age and reported activity level.    Preoperative templating of the joint replacement has been completed, documented, and submitted to the Operating Room personnel in order to optimize intra-operative equipment management.     Assessment/Plan:  AVN with collapse, right hip(s)  The patient history, physical examination, clinical judgement of the provider and imaging studies are consistent with end stage  degenerative joint disease of the right hip(s) and total hip arthroplasty is deemed medically necessary. The treatment options including medical management, injection therapy, arthroscopy and arthroplasty were discussed at length. The risks and benefits of total hip arthroplasty were presented and reviewed. The risks due to aseptic loosening, infection, stiffness, dislocation/subluxation,  thromboembolic complications and other imponderables were discussed.  The patient acknowledged the explanation, agreed to proceed with the plan and consent was signed. Patient is being admitted for inpatient treatment for surgery, pain control, PT, OT, prophylactic antibiotics, VTE prophylaxis, progressive ambulation and ADL's and discharge planning.The patient is planning to be discharged home with home health services

## 2018-08-10 ENCOUNTER — Encounter (HOSPITAL_COMMUNITY): Payer: Self-pay

## 2018-08-10 NOTE — Pre-Procedure Instructions (Signed)
The following are in epic and chart: Last office visit note and clearance dr. Lawerance Bach 06/30/2018 EKG 05/25/2018 Hgb A1C 06/30/2018

## 2018-08-10 NOTE — Patient Instructions (Signed)
Your procedure is scheduled on: Wednesday, Oct. 30, 2019   Surgery Time:  1:30PM-4:03PM   Report to [facility] Main  Entrance    Report to admitting at 11:00 AM   Call this number if you have problems the morning of surgery [phone]   Do not eat food or drink liquids :After Midnight.   Brush your teeth the morning of surgery.   Do NOT smoke after Midnight   Take these medicines the morning of surgery with A SIP OF WATER: Metoprolol  DO NOT TAKE ANY DIABETIC MEDICATIONS DAY OF YOUR SURGERY                               You may not have any metal on your body including jewelry, and body piercings             Do not wear lotions, powders, perfumes/cologne, or deodorant                           Men may shave face and neck.   Do not bring valuables to the hospital.  IS NOT             RESPONSIBLE   FOR VALUABLES.   Contacts, dentures or bridgework may not be worn into surgery.   Leave suitcase in the car. After surgery it may be brought to your room.   Special Instructions: Bring a copy of your healthcare power of attorney and living will documents         the day of surgery if you haven't scanned them in before.              Please read over the following fact sheets you were given:  [facility] - Preparing for Surgery Before surgery, you can play an important role.  Because skin is not sterile, your skin needs to be as free of germs as possible.  You can reduce the number of germs on your skin by washing with CHG (chlorahexidine gluconate) soap before surgery.  CHG is an antiseptic cleaner which kills germs and bonds with the skin to continue killing germs even after washing. Please DO NOT use if you have an allergy to CHG or antibacterial soaps.  If your skin becomes reddened/irritated stop using the CHG and inform your nurse when you arrive at Short Stay. Do not shave (including legs and underarms) for at least 48 hours prior to the first CHG  shower.  You may shave your face/neck.  Please follow these instructions carefully:  1.  Shower with CHG Soap the night before surgery and the  morning of surgery.  2.  If you choose to wash your hair, wash your hair first as usual with your normal  shampoo.  3.  After you shampoo, rinse your hair and body thoroughly to remove the shampoo.                             4.  Use CHG as you would any other liquid soap.  You can apply chg directly to the skin and wash.  Gently with a scrungie or clean washcloth.  5.  Apply the CHG Soap to your body ONLY FROM THE NECK DOWN.   Do   not use on face/ open  Wound or open sores. Avoid contact with eyes, ears mouth and   genitals (private parts).                       Wash face,  Genitals (private parts) with your normal soap.             6.  Wash thoroughly, paying special attention to the area where your    surgery  will be performed.  7.  Thoroughly rinse your body with warm water from the neck down.  8.  DO NOT shower/wash with your normal soap after using and rinsing off the CHG Soap.                9.  Pat yourself dry with a clean towel.            10.  Wear clean pajamas.            11.  Place clean sheets on your bed the night of your first shower and do not  sleep with pets. Day of Surgery : Do not apply any lotions/deodorants the morning of surgery.  Please wear clean clothes to the hospital/surgery center.  FAILURE TO FOLLOW THESE INSTRUCTIONS MAY RESULT IN THE CANCELLATION OF YOUR SURGERY  PATIENT SIGNATURE_________________________________  NURSE SIGNATURE__________________________________  ________________________________________________________________________   [name]  An incentive spirometer is a tool that can help keep your lungs clear and active. This tool measures how well you are filling your lungs with each breath. Taking long deep breaths may help reverse or decrease the chance of  developing breathing (pulmonary) problems (especially infection) following:  A long period of time when you are unable to move or be active. BEFORE THE PROCEDURE   If the spirometer includes an indicator to show your best effort, your nurse or respiratory therapist will set it to a desired goal.  If possible, sit up straight or lean slightly forward. Try not to slouch.  Hold the incentive spirometer in an upright position. INSTRUCTIONS FOR USE  1. Sit on the edge of your bed if possible, or sit up as far as you can in bed or on a chair. 2. Hold the incentive spirometer in an upright position. 3. Breathe out normally. 4. Place the mouthpiece in your mouth and seal your lips tightly around it. 5. Breathe in slowly and as deeply as possible, raising the piston or the ball toward the top of the column. 6. Hold your breath for 3-5 seconds or for as long as possible. Allow the piston or ball to fall to the bottom of the column. 7. Remove the mouthpiece from your mouth and breathe out normally. 8. Rest for a few seconds and repeat Steps 1 through 7 at least 10 times every 1-2 hours when you are awake. Take your time and take a few normal breaths between deep breaths. 9. The spirometer may include an indicator to show your best effort. Use the indicator as a goal to work toward during each repetition. 10. After each set of 10 deep breaths, practice coughing to be sure your lungs are clear. If you have an incision (the cut made at the time of surgery), support your incision when coughing by placing a pillow or rolled up towels firmly against it. Once you are able to get out of bed, walk around indoors and cough well. You may stop using the incentive spirometer when instructed by your caregiver.  RISKS AND COMPLICATIONS  Take your time  so you do not get dizzy or light-headed.  If you are in pain, you may need to take or ask for pain medication before doing incentive spirometry. It is harder to take a  deep breath if you are having pain. AFTER USE  Rest and breathe slowly and easily.  It can be helpful to keep track of a log of your progress. Your caregiver can provide you with a simple table to help with this. If you are using the spirometer at home, follow these instructions: [place] IF:   You are having difficultly using the spirometer.  You have trouble using the spirometer as often as instructed.  Your pain medication is not giving enough relief while using the spirometer.  You develop fever of 100.5 F (38.1 C) or higher. SEEK IMMEDIATE MEDICAL CARE IF:   You cough up bloody sputum that had not been present before.  You develop fever of 102 F (38.9 C) or greater.  You develop worsening pain at or near the incision site. MAKE SURE YOU:   Understand these instructions.  Will watch your condition.  Will get help right away if you are not doing well or get worse. Document Released: 02/14/2007 Document Revised: 12/27/2011 Document Reviewed: 04/17/2007 Exit[employer] Patient Information 2014 ExitCare, [place].   ________________________________________________________________________  WHAT IS A BLOOD TRANSFUSION? Blood Transfusion Information  A transfusion is the replacement of blood or some of its parts. Blood is made up of multiple cells which provide different functions.  Red blood cells carry oxygen and are used for blood loss replacement.  White blood cells fight against infection.  Platelets control bleeding.  Plasma helps clot blood.  Other blood products are available for specialized needs, such as hemophilia or other clotting disorders. BEFORE THE TRANSFUSION  Who gives blood for transfusions?   Healthy volunteers who are fully evaluated to make sure their blood is safe. This is blood bank blood. Transfusion therapy is the safest it has ever been in the practice of medicine. Before blood is taken from a donor, a complete history is taken to make  sure that person has no history of diseases nor engages in risky social behavior (examples are intravenous drug use or sexual activity with multiple partners). The donor's travel history is screened to minimize risk of transmitting infections, such as malaria. The donated blood is tested for signs of infectious diseases, such as HIV and hepatitis. The blood is then tested to be sure it is compatible with you in order to minimize the chance of a transfusion reaction. If you or a relative donates blood, this is often done in anticipation of surgery and is not appropriate for emergency situations. It takes many days to process the donated blood. RISKS AND COMPLICATIONS Although transfusion therapy is very safe and saves many lives, the main dangers of transfusion include:   Getting an infectious disease.  Developing a transfusion reaction. This is an allergic reaction to something in the blood you were given. Every precaution is taken to prevent this. The decision to have a blood transfusion has been considered carefully by your caregiver before blood is given. Blood is not given unless the benefits outweigh the risks. AFTER THE TRANSFUSION  Right after receiving a blood transfusion, you will usually feel much better and more energetic. This is especially true if your red blood cells have gotten low (anemic). The transfusion raises the level of the red blood cells which carry oxygen, and this usually causes an energy increase.  The  nurse administering the transfusion will monitor you carefully for complications. HOME CARE INSTRUCTIONS  No special instructions are needed after a transfusion. You may find your energy is better. Speak with your caregiver about any limitations on activity for underlying diseases you may have. SEEK MEDICAL CARE IF:   Your condition is not improving after your transfusion.  You develop redness or irritation at the intravenous (IV) site. SEEK IMMEDIATE MEDICAL CARE IF:   Any of the following symptoms occur over the next 12 hours:  Shaking chills.  You have a temperature by mouth above 102 F (38.9 C), not controlled by medicine.  Chest, back, or muscle pain.  People around you feel you are not acting correctly or are confused.  Shortness of breath or difficulty breathing.  Dizziness and fainting.  You get a rash or develop hives.  You have a decrease in urine output.  Your urine turns a dark color or changes to pink, red, or brown. Any of the following symptoms occur over the next 10 days:  You have a temperature by mouth above 102 F (38.9 C), not controlled by medicine.  Shortness of breath.  Weakness after normal activity.  The white part of the eye turns yellow (jaundice).  You have a decrease in the amount of urine or are urinating less often.  Your urine turns a dark color or changes to pink, red, or brown. Document Released: 10/01/2000 Document Revised: 12/27/2011 Document Reviewed: 05/20/2008 [employer] Patient Information 2014 [place], [place].  _______________________________________________________________________

## 2018-08-11 ENCOUNTER — Encounter (HOSPITAL_COMMUNITY): Payer: Self-pay

## 2018-08-11 ENCOUNTER — Other Ambulatory Visit: Payer: Self-pay

## 2018-08-11 ENCOUNTER — Encounter (HOSPITAL_COMMUNITY)
Admission: RE | Admit: 2018-08-11 | Discharge: 2018-08-11 | Disposition: A | Discharge status: Home / Self Care | Payer: BLUE CROSS/BLUE SHIELD | Source: Ambulatory Visit | Attending: Orthopedic Surgery | Admitting: Orthopedic Surgery

## 2018-08-11 DIAGNOSIS — Z01812 Encounter for preprocedural laboratory examination: Secondary | ICD-10-CM | POA: Diagnosis not present

## 2018-08-11 DIAGNOSIS — M879 Osteonecrosis, unspecified: Secondary | ICD-10-CM | POA: Insufficient documentation

## 2018-08-11 HISTORY — DX: Cannabis abuse, uncomplicated: F12.10

## 2018-08-11 HISTORY — DX: Morbid (severe) obesity due to excess calories: E66.01

## 2018-08-11 HISTORY — DX: Fatty (change of) liver, not elsewhere classified: K76.0

## 2018-08-11 HISTORY — DX: Other hammer toe(s) (acquired), unspecified foot: M20.40

## 2018-08-11 HISTORY — DX: Abnormal levels of other serum enzymes: R74.8

## 2018-08-11 HISTORY — DX: Other forms of dyspnea: R06.09

## 2018-08-11 HISTORY — DX: Gastro-esophageal reflux disease without esophagitis: K21.9

## 2018-08-11 HISTORY — DX: Hyperlipidemia, unspecified: E78.5

## 2018-08-11 HISTORY — DX: Non-pressure chronic ulcer of other part of unspecified foot with unspecified severity: L97.509

## 2018-08-11 HISTORY — DX: Carpal tunnel syndrome, bilateral upper limbs: G56.03

## 2018-08-11 HISTORY — DX: Obstructive sleep apnea (adult) (pediatric): G47.33

## 2018-08-11 HISTORY — DX: Nausea with vomiting, unspecified: R11.2

## 2018-08-11 HISTORY — DX: Other specified postprocedural states: Z98.890

## 2018-08-11 HISTORY — DX: Alcohol dependence, uncomplicated: F10.20

## 2018-08-11 HISTORY — DX: Dyspnea, unspecified: R06.00

## 2018-08-11 HISTORY — DX: Localized edema: R60.0

## 2018-08-11 LAB — CBC
HCT: 35.8 % — ABNORMAL LOW (ref 39.0–52.0)
Hemoglobin: 12.1 g/dL — ABNORMAL LOW (ref 13.0–17.0)
MCH: 34.9 pg — ABNORMAL HIGH (ref 26.0–34.0)
MCHC: 33.8 g/dL (ref 30.0–36.0)
MCV: 103.2 fL — AB (ref 80.0–100.0)
Platelets: 302 10*3/uL (ref 150–400)
RBC: 3.47 MIL/uL — ABNORMAL LOW (ref 4.22–5.81)
RDW: 13.1 % (ref 11.5–15.5)
WBC: 6 10*3/uL (ref 4.0–10.5)
nRBC: 0 % (ref 0.0–0.2)

## 2018-08-11 LAB — BASIC METABOLIC PANEL
Anion gap: 12 (ref 5–15)
BUN: 9 mg/dL (ref 6–20)
CALCIUM: 9.1 mg/dL (ref 8.9–10.3)
CO2: 24 mmol/L (ref 22–32)
CREATININE: 0.8 mg/dL (ref 0.61–1.24)
Chloride: 101 mmol/L (ref 98–111)
GFR calc Af Amer: >60 mL/min (ref >60)
GFR calc non Af Amer: >60 mL/min (ref >60)
GLUCOSE: 155 mg/dL — AB (ref 70–99)
Potassium: 3.9 mmol/L (ref 3.5–5.1)
Sodium: 137 mmol/L (ref 135–145)

## 2018-08-11 LAB — SURGICAL PCR SCREEN
MRSA, PCR: NEGATIVE
Staphylococcus aureus: NEGATIVE

## 2018-08-11 LAB — GLUCOSE, CAPILLARY: Glucose-Capillary: 169 mg/dL — ABNORMAL HIGH (ref 70–99)

## 2018-08-12 LAB — ABO/RH: ABO/RH(D): B POS

## 2018-08-15 MED ORDER — DEXTROSE 5 % IV SOLN
3.0000 g | INTRAVENOUS | Status: AC
  Administered 2018-08-16: 3 g via INTRAVENOUS
  Filled 2018-08-15: qty 3

## 2018-08-15 NOTE — Pre-Procedure Instructions (Signed)
CBC and BMP results 1025/19 sent to Dr. Linna Caprice via epic.

## 2018-08-16 ENCOUNTER — Encounter (HOSPITAL_COMMUNITY): Payer: Self-pay | Admitting: *Deleted

## 2018-08-16 ENCOUNTER — Inpatient Hospital Stay (HOSPITAL_COMMUNITY): Payer: BLUE CROSS/BLUE SHIELD

## 2018-08-16 ENCOUNTER — Other Ambulatory Visit: Payer: Self-pay

## 2018-08-16 ENCOUNTER — Inpatient Hospital Stay (HOSPITAL_COMMUNITY)
Admission: RE | Admit: 2018-08-16 | Discharge: 2018-08-18 | DRG: 470 | Disposition: A | Discharge status: Home Health | Payer: BLUE CROSS/BLUE SHIELD | Attending: Orthopedic Surgery | Admitting: Orthopedic Surgery

## 2018-08-16 ENCOUNTER — Encounter (HOSPITAL_COMMUNITY)
Admission: RE | Disposition: A | Discharge status: Home Health | Payer: Self-pay | Source: Home / Self Care | Attending: Orthopedic Surgery

## 2018-08-16 ENCOUNTER — Inpatient Hospital Stay (HOSPITAL_COMMUNITY): Payer: BLUE CROSS/BLUE SHIELD | Admitting: Anesthesiology

## 2018-08-16 DIAGNOSIS — Z87891 Personal history of nicotine dependence: Secondary | ICD-10-CM | POA: Diagnosis not present

## 2018-08-16 DIAGNOSIS — Z888 Allergy status to other drugs, medicaments and biological substances status: Secondary | ICD-10-CM | POA: Diagnosis not present

## 2018-08-16 DIAGNOSIS — G4733 Obstructive sleep apnea (adult) (pediatric): Secondary | ICD-10-CM | POA: Diagnosis present

## 2018-08-16 DIAGNOSIS — M87851 Other osteonecrosis, right femur: Secondary | ICD-10-CM | POA: Diagnosis not present

## 2018-08-16 DIAGNOSIS — K7460 Unspecified cirrhosis of liver: Secondary | ICD-10-CM | POA: Diagnosis present

## 2018-08-16 DIAGNOSIS — Z96641 Presence of right artificial hip joint: Secondary | ICD-10-CM | POA: Diagnosis not present

## 2018-08-16 DIAGNOSIS — Z419 Encounter for procedure for purposes other than remedying health state, unspecified: Secondary | ICD-10-CM

## 2018-08-16 DIAGNOSIS — M19041 Primary osteoarthritis, right hand: Secondary | ICD-10-CM | POA: Diagnosis not present

## 2018-08-16 DIAGNOSIS — Z6836 Body mass index (BMI) 36.0-36.9, adult: Secondary | ICD-10-CM

## 2018-08-16 DIAGNOSIS — M25551 Pain in right hip: Secondary | ICD-10-CM | POA: Diagnosis present

## 2018-08-16 DIAGNOSIS — K760 Fatty (change of) liver, not elsewhere classified: Secondary | ICD-10-CM | POA: Diagnosis not present

## 2018-08-16 DIAGNOSIS — M87051 Idiopathic aseptic necrosis of right femur: Secondary | ICD-10-CM

## 2018-08-16 DIAGNOSIS — E119 Type 2 diabetes mellitus without complications: Secondary | ICD-10-CM | POA: Diagnosis not present

## 2018-08-16 DIAGNOSIS — Z79899 Other long term (current) drug therapy: Secondary | ICD-10-CM

## 2018-08-16 DIAGNOSIS — I10 Essential (primary) hypertension: Secondary | ICD-10-CM | POA: Diagnosis present

## 2018-08-16 DIAGNOSIS — Z79891 Long term (current) use of opiate analgesic: Secondary | ICD-10-CM | POA: Diagnosis not present

## 2018-08-16 DIAGNOSIS — Z471 Aftercare following joint replacement surgery: Secondary | ICD-10-CM | POA: Diagnosis not present

## 2018-08-16 DIAGNOSIS — Z89422 Acquired absence of other left toe(s): Secondary | ICD-10-CM

## 2018-08-16 DIAGNOSIS — M19042 Primary osteoarthritis, left hand: Secondary | ICD-10-CM | POA: Diagnosis not present

## 2018-08-16 DIAGNOSIS — E785 Hyperlipidemia, unspecified: Secondary | ICD-10-CM | POA: Diagnosis present

## 2018-08-16 DIAGNOSIS — K219 Gastro-esophageal reflux disease without esophagitis: Secondary | ICD-10-CM | POA: Diagnosis not present

## 2018-08-16 DIAGNOSIS — Z7984 Long term (current) use of oral hypoglycemic drugs: Secondary | ICD-10-CM | POA: Diagnosis not present

## 2018-08-16 DIAGNOSIS — M160 Bilateral primary osteoarthritis of hip: Secondary | ICD-10-CM | POA: Diagnosis not present

## 2018-08-16 DIAGNOSIS — Z09 Encounter for follow-up examination after completed treatment for conditions other than malignant neoplasm: Secondary | ICD-10-CM

## 2018-08-16 HISTORY — PX: TOTAL HIP ARTHROPLASTY: SHX124

## 2018-08-16 LAB — TYPE AND SCREEN
ABO/RH(D): B POS
Antibody Screen: NEGATIVE

## 2018-08-16 LAB — GLUCOSE, CAPILLARY
GLUCOSE-CAPILLARY: 116 mg/dL — AB (ref 70–99)
GLUCOSE-CAPILLARY: 98 mg/dL (ref 70–99)
Glucose-Capillary: 101 mg/dL — ABNORMAL HIGH (ref 70–99)

## 2018-08-16 SURGERY — ARTHROPLASTY, HIP, TOTAL, ANTERIOR APPROACH
Anesthesia: Spinal | Laterality: Right

## 2018-08-16 MED ORDER — CHLORHEXIDINE GLUCONATE 4 % EX LIQD: 60.0000 mL | Freq: Once | CUTANEOUS | Status: DC

## 2018-08-16 MED ORDER — FENTANYL CITRATE (PF) 100 MCG/2ML IJ SOLN
INTRAMUSCULAR | Status: AC
  Filled 2018-08-16: qty 4

## 2018-08-16 MED ORDER — METOPROLOL TARTRATE 50 MG PO TABS
100.0000 mg | ORAL_TABLET | Freq: Two times a day (BID) | ORAL | Status: DC
  Administered 2018-08-17 – 2018-08-18 (×3): 100 mg via ORAL
  Filled 2018-08-16 (×4): qty 2

## 2018-08-16 MED ORDER — DIPHENHYDRAMINE HCL 12.5 MG/5ML PO ELIX: 12.5000 mg | ORAL_SOLUTION | ORAL | Status: DC | PRN

## 2018-08-16 MED ORDER — FENTANYL CITRATE (PF) 100 MCG/2ML IJ SOLN
INTRAMUSCULAR | Status: DC | PRN
  Administered 2018-08-16: 25 ug via INTRAVENOUS
  Administered 2018-08-16: 50 ug via INTRAVENOUS
  Administered 2018-08-16 (×3): 25 ug via INTRAVENOUS

## 2018-08-16 MED ORDER — KETOROLAC TROMETHAMINE 30 MG/ML IJ SOLN
INTRAMUSCULAR | Status: AC
  Filled 2018-08-16: qty 1

## 2018-08-16 MED ORDER — KETOROLAC TROMETHAMINE 15 MG/ML IJ SOLN
15.0000 mg | Freq: Four times a day (QID) | INTRAMUSCULAR | Status: AC
  Administered 2018-08-17 (×4): 15 mg via INTRAVENOUS
  Filled 2018-08-16 (×4): qty 1

## 2018-08-16 MED ORDER — POVIDONE-IODINE 10 % EX SWAB
2.0000 "application " | Freq: Once | CUTANEOUS | Status: AC
  Administered 2018-08-16: 2 via TOPICAL

## 2018-08-16 MED ORDER — MIDAZOLAM HCL 5 MG/5ML IJ SOLN
INTRAMUSCULAR | Status: DC | PRN
  Administered 2018-08-16: .5 mg via INTRAVENOUS
  Administered 2018-08-16: .25 mg via INTRAVENOUS
  Administered 2018-08-16 (×2): 1 mg via INTRAVENOUS
  Administered 2018-08-16 (×5): .25 mg via INTRAVENOUS

## 2018-08-16 MED ORDER — SODIUM CHLORIDE 0.9 % IJ SOLN
INTRAMUSCULAR | Status: AC
  Filled 2018-08-16: qty 50

## 2018-08-16 MED ORDER — DEXAMETHASONE SODIUM PHOSPHATE 10 MG/ML IJ SOLN
10.0000 mg | Freq: Once | INTRAMUSCULAR | Status: AC
  Administered 2018-08-17: 10 mg via INTRAVENOUS
  Filled 2018-08-16: qty 1

## 2018-08-16 MED ORDER — SODIUM CHLORIDE 0.9 % IV SOLN
INTRAVENOUS | Status: DC | PRN
  Administered 2018-08-16: 160 ug/min via INTRAVENOUS

## 2018-08-16 MED ORDER — ASPIRIN 81 MG PO CHEW
81.0000 mg | CHEWABLE_TABLET | Freq: Two times a day (BID) | ORAL | Status: DC
  Administered 2018-08-16 – 2018-08-18 (×4): 81 mg via ORAL
  Filled 2018-08-16 (×4): qty 1

## 2018-08-16 MED ORDER — ALBUMIN HUMAN 5 % IV SOLN
INTRAVENOUS | Status: DC | PRN
  Administered 2018-08-16 (×2): via INTRAVENOUS

## 2018-08-16 MED ORDER — FENTANYL CITRATE (PF) 100 MCG/2ML IJ SOLN
25.0000 ug | INTRAMUSCULAR | Status: DC | PRN
  Administered 2018-08-16: 50 ug via INTRAVENOUS

## 2018-08-16 MED ORDER — ISOPROPYL ALCOHOL 70 % SOLN
Status: DC | PRN
  Administered 2018-08-16: 1 via TOPICAL

## 2018-08-16 MED ORDER — HYDROCODONE-ACETAMINOPHEN 5-325 MG PO TABS
1.0000 | ORAL_TABLET | ORAL | Status: DC | PRN
  Administered 2018-08-17 – 2018-08-18 (×7): 2 via ORAL
  Filled 2018-08-16 (×7): qty 2

## 2018-08-16 MED ORDER — KETOROLAC TROMETHAMINE 30 MG/ML IJ SOLN
INTRAMUSCULAR | Status: DC | PRN
  Administered 2018-08-16: 30 mg via INTRA_ARTICULAR

## 2018-08-16 MED ORDER — SODIUM CHLORIDE 0.9 % IV SOLN: INTRAVENOUS | Status: DC

## 2018-08-16 MED ORDER — BUPIVACAINE IN DEXTROSE 0.75-8.25 % IT SOLN
INTRATHECAL | Status: DC | PRN
  Administered 2018-08-16: 1.6 mL via INTRATHECAL

## 2018-08-16 MED ORDER — ONDANSETRON HCL 4 MG/2ML IJ SOLN
INTRAMUSCULAR | Status: DC | PRN
  Administered 2018-08-16: 4 mg via INTRAVENOUS

## 2018-08-16 MED ORDER — METOPROLOL TARTRATE 50 MG PO TABS
100.0000 mg | ORAL_TABLET | Freq: Once | ORAL | Status: AC
  Administered 2018-08-16: 100 mg via ORAL
  Filled 2018-08-16: qty 2

## 2018-08-16 MED ORDER — PROPOFOL 10 MG/ML IV BOLUS
INTRAVENOUS | Status: AC
  Filled 2018-08-16: qty 60

## 2018-08-16 MED ORDER — BUPIVACAINE-EPINEPHRINE 0.25% -1:200000 IJ SOLN
INTRAMUSCULAR | Status: DC | PRN
  Administered 2018-08-16: 30 mL

## 2018-08-16 MED ORDER — METHOCARBAMOL 500 MG IVPB - SIMPLE MED
INTRAVENOUS | Status: AC
  Administered 2018-08-16: 500 mg
  Filled 2018-08-16: qty 50

## 2018-08-16 MED ORDER — PROPOFOL 500 MG/50ML IV EMUL
INTRAVENOUS | Status: DC | PRN
  Administered 2018-08-16: 150 ug/kg/min via INTRAVENOUS

## 2018-08-16 MED ORDER — BUPIVACAINE HCL (PF) 0.25 % IJ SOLN
INTRAMUSCULAR | Status: AC
  Filled 2018-08-16: qty 30

## 2018-08-16 MED ORDER — CALCIUM CARBONATE ANTACID 500 MG PO CHEW: 2.0000 | CHEWABLE_TABLET | Freq: Every day | ORAL | Status: DC | PRN

## 2018-08-16 MED ORDER — CEFAZOLIN SODIUM-DEXTROSE 2-4 GM/100ML-% IV SOLN
2.0000 g | Freq: Four times a day (QID) | INTRAVENOUS | Status: AC
  Administered 2018-08-16 – 2018-08-17 (×2): 2 g via INTRAVENOUS
  Filled 2018-08-16 (×2): qty 100

## 2018-08-16 MED ORDER — METFORMIN HCL 500 MG PO TABS
500.0000 mg | ORAL_TABLET | Freq: Two times a day (BID) | ORAL | Status: DC
  Administered 2018-08-17 – 2018-08-18 (×3): 500 mg via ORAL
  Filled 2018-08-16 (×3): qty 1

## 2018-08-16 MED ORDER — MORPHINE SULFATE (PF) 2 MG/ML IV SOLN: 0.5000 mg | INTRAVENOUS | Status: DC | PRN

## 2018-08-16 MED ORDER — FENTANYL CITRATE (PF) 100 MCG/2ML IJ SOLN
INTRAMUSCULAR | Status: AC
  Filled 2018-08-16: qty 2

## 2018-08-16 MED ORDER — ACETAMINOPHEN 325 MG PO TABS: 325.0000 mg | ORAL_TABLET | Freq: Four times a day (QID) | ORAL | Status: DC | PRN

## 2018-08-16 MED ORDER — HYDROCODONE-ACETAMINOPHEN 7.5-325 MG PO TABS
1.0000 | ORAL_TABLET | ORAL | Status: DC | PRN
  Administered 2018-08-16 – 2018-08-18 (×2): 2 via ORAL
  Filled 2018-08-16 (×2): qty 2

## 2018-08-16 MED ORDER — ALBUMIN HUMAN 5 % IV SOLN
INTRAVENOUS | Status: AC
  Filled 2018-08-16: qty 500

## 2018-08-16 MED ORDER — SODIUM CHLORIDE 0.9 % IV SOLN
INTRAVENOUS | Status: DC
  Administered 2018-08-16: 19:00:00 via INTRAVENOUS

## 2018-08-16 MED ORDER — SENNA 8.6 MG PO TABS
1.0000 | ORAL_TABLET | Freq: Two times a day (BID) | ORAL | Status: DC
  Administered 2018-08-16 – 2018-08-18 (×4): 8.6 mg via ORAL
  Filled 2018-08-16 (×4): qty 1

## 2018-08-16 MED ORDER — ACETAMINOPHEN 10 MG/ML IV SOLN
1000.0000 mg | INTRAVENOUS | Status: AC
  Administered 2018-08-16: 1000 mg via INTRAVENOUS
  Filled 2018-08-16: qty 100

## 2018-08-16 MED ORDER — POLYETHYLENE GLYCOL 3350 17 G PO PACK: 17.0000 g | PACK | Freq: Every day | ORAL | Status: DC | PRN

## 2018-08-16 MED ORDER — LOSARTAN POTASSIUM 50 MG PO TABS
50.0000 mg | ORAL_TABLET | Freq: Every day | ORAL | Status: DC
  Administered 2018-08-17 – 2018-08-18 (×2): 50 mg via ORAL
  Filled 2018-08-16 (×2): qty 1

## 2018-08-16 MED ORDER — PHENYLEPHRINE HCL 10 MG/ML IJ SOLN
INTRAMUSCULAR | Status: AC
  Filled 2018-08-16: qty 1

## 2018-08-16 MED ORDER — LACTATED RINGERS IV SOLN
INTRAVENOUS | Status: DC
  Administered 2018-08-16 (×3): via INTRAVENOUS

## 2018-08-16 MED ORDER — MENTHOL 3 MG MT LOZG: 1.0000 | LOZENGE | OROMUCOSAL | Status: DC | PRN

## 2018-08-16 MED ORDER — FUROSEMIDE 40 MG PO TABS
40.0000 mg | ORAL_TABLET | Freq: Every day | ORAL | Status: DC
  Administered 2018-08-17 – 2018-08-18 (×2): 40 mg via ORAL
  Filled 2018-08-16 (×2): qty 1

## 2018-08-16 MED ORDER — INSULIN ASPART 100 UNIT/ML ~~LOC~~ SOLN
0.0000 [IU] | Freq: Three times a day (TID) | SUBCUTANEOUS | Status: DC
  Administered 2018-08-17 (×2): 5 [IU] via SUBCUTANEOUS
  Administered 2018-08-18: 2 [IU] via SUBCUTANEOUS

## 2018-08-16 MED ORDER — PHENOL 1.4 % MT LIQD
1.0000 | OROMUCOSAL | Status: DC | PRN
  Filled 2018-08-16: qty 177

## 2018-08-16 MED ORDER — MIDAZOLAM HCL 2 MG/2ML IJ SOLN
INTRAMUSCULAR | Status: AC
  Filled 2018-08-16: qty 2

## 2018-08-16 MED ORDER — DEXMEDETOMIDINE HCL IN NACL 200 MCG/50ML IV SOLN
INTRAVENOUS | Status: DC | PRN
  Administered 2018-08-16 (×9): 4 ug via INTRAVENOUS

## 2018-08-16 MED ORDER — ALUM & MAG HYDROXIDE-SIMETH 200-200-20 MG/5ML PO SUSP: 30.0000 mL | ORAL | Status: DC | PRN

## 2018-08-16 MED ORDER — SODIUM CHLORIDE 0.9 % IR SOLN
Status: DC | PRN
  Administered 2018-08-16: 4000 mL

## 2018-08-16 MED ORDER — METHOCARBAMOL 500 MG PO TABS
500.0000 mg | ORAL_TABLET | Freq: Four times a day (QID) | ORAL | Status: DC | PRN
  Administered 2018-08-17 (×2): 500 mg via ORAL
  Filled 2018-08-16 (×2): qty 1

## 2018-08-16 MED ORDER — METHOCARBAMOL 500 MG IVPB - SIMPLE MED
500.0000 mg | Freq: Four times a day (QID) | INTRAVENOUS | Status: DC | PRN
  Administered 2018-08-16: 500 mg via INTRAVENOUS
  Filled 2018-08-16: qty 50

## 2018-08-16 MED ORDER — ONDANSETRON HCL 4 MG/2ML IJ SOLN: 4.0000 mg | Freq: Four times a day (QID) | INTRAMUSCULAR | Status: DC | PRN

## 2018-08-16 MED ORDER — TRANEXAMIC ACID-NACL 1000-0.7 MG/100ML-% IV SOLN
1000.0000 mg | INTRAVENOUS | Status: AC
  Administered 2018-08-16: 1000 mg via INTRAVENOUS
  Filled 2018-08-16: qty 100

## 2018-08-16 MED ORDER — METOCLOPRAMIDE HCL 5 MG/ML IJ SOLN: 5.0000 mg | Freq: Three times a day (TID) | INTRAMUSCULAR | Status: DC | PRN

## 2018-08-16 MED ORDER — DIPHENHYDRAMINE HCL 25 MG PO CAPS
25.0000 mg | ORAL_CAPSULE | Freq: Every evening | ORAL | Status: DC | PRN
  Administered 2018-08-18: 25 mg via ORAL
  Filled 2018-08-16: qty 1

## 2018-08-16 MED ORDER — ONDANSETRON HCL 4 MG PO TABS: 4.0000 mg | ORAL_TABLET | Freq: Four times a day (QID) | ORAL | Status: DC | PRN

## 2018-08-16 MED ORDER — METOCLOPRAMIDE HCL 5 MG PO TABS: 5.0000 mg | ORAL_TABLET | Freq: Three times a day (TID) | ORAL | Status: DC | PRN

## 2018-08-16 MED ORDER — SODIUM CHLORIDE 0.9 % IJ SOLN
INTRAMUSCULAR | Status: DC | PRN
  Administered 2018-08-16: 30 mL

## 2018-08-16 MED ORDER — PHENYLEPHRINE HCL 10 MG/ML IJ SOLN
INTRAMUSCULAR | Status: DC | PRN
  Administered 2018-08-16: 40 ug via INTRAVENOUS

## 2018-08-16 MED ORDER — PROPOFOL 10 MG/ML IV BOLUS
INTRAVENOUS | Status: AC
  Filled 2018-08-16: qty 80

## 2018-08-16 MED ORDER — DOCUSATE SODIUM 100 MG PO CAPS
100.0000 mg | ORAL_CAPSULE | Freq: Two times a day (BID) | ORAL | Status: DC
  Administered 2018-08-16 – 2018-08-18 (×4): 100 mg via ORAL
  Filled 2018-08-16 (×4): qty 1

## 2018-08-16 SURGICAL SUPPLY — 49 items
BAG DECANTER FOR FLEXI CONT (MISCELLANEOUS) IMPLANT
BAG ZIPLOCK 12X15 (MISCELLANEOUS) IMPLANT
CHLORAPREP W/TINT 26ML (MISCELLANEOUS) ×2 IMPLANT
CLOTH BEACON ORANGE TIMEOUT ST (SAFETY) ×2 IMPLANT
COVER PERINEAL POST (MISCELLANEOUS) ×2 IMPLANT
COVER SURGICAL LIGHT HANDLE (MISCELLANEOUS) ×2 IMPLANT
COVER WAND RF STERILE (DRAPES) ×2 IMPLANT
CUP ACETAB 58MM ×2 IMPLANT
DECANTER SPIKE VIAL GLASS SM (MISCELLANEOUS) ×2 IMPLANT
DERMABOND ADVANCED (GAUZE/BANDAGES/DRESSINGS) ×2
DERMABOND ADVANCED .7 DNX12 (GAUZE/BANDAGES/DRESSINGS) ×2 IMPLANT
DRAPE SHEET LG 3/4 BI-LAMINATE (DRAPES) ×6 IMPLANT
DRAPE STERI IOBAN 125X83 (DRAPES) ×2 IMPLANT
DRAPE U-SHAPE 47X51 STRL (DRAPES) ×4 IMPLANT
DRSG AQUACEL AG ADV 3.5X10 (GAUZE/BANDAGES/DRESSINGS) ×2 IMPLANT
ELECT PENCIL ROCKER SW 15FT (MISCELLANEOUS) ×2 IMPLANT
ELECT REM PT RETURN 15FT ADLT (MISCELLANEOUS) ×2 IMPLANT
GAUZE SPONGE 4X4 12PLY STRL (GAUZE/BANDAGES/DRESSINGS) ×2 IMPLANT
GLOVE BIO SURGEON STRL SZ8.5 (GLOVE) ×20 IMPLANT
GLOVE BIOGEL PI IND STRL 8.5 (GLOVE) ×1 IMPLANT
GLOVE BIOGEL PI INDICATOR 8.5 (GLOVE) ×1
GOWN SPEC L3 XXLG W/TWL (GOWN DISPOSABLE) ×8 IMPLANT
HANDPIECE INTERPULSE COAX TIP (DISPOSABLE) ×1
HEAD CERAMIC 36 PLUS 8.5 12 14 (Hips) ×2 IMPLANT
HEAD CERAMIC 36 PLUS5 (Hips) IMPLANT
HOLDER FOLEY CATH W/STRAP (MISCELLANEOUS) ×2 IMPLANT
HOOD PEEL AWAY FLYTE STAYCOOL (MISCELLANEOUS) ×8 IMPLANT
LINER NEUTRAL 36X58 PLUS4 ×2 IMPLANT
MARKER SKIN DUAL TIP RULER LAB (MISCELLANEOUS) ×2 IMPLANT
NEEDLE SPNL 18GX3.5 QUINCKE PK (NEEDLE) ×2 IMPLANT
PACK ANTERIOR HIP CUSTOM (KITS) ×2 IMPLANT
SAW OSC TIP CART 19.5X105X1.3 (SAW) ×2 IMPLANT
SCREW 6.5MMX30MM (Screw) ×4 IMPLANT
SEALER BIPOLAR AQUA 6.0 (INSTRUMENTS) ×2 IMPLANT
SET HNDPC FAN SPRY TIP SCT (DISPOSABLE) ×1 IMPLANT
STEM TRI LOC BPS SZ8 W GRIPTON ×1 IMPLANT
SUT ETHIBOND NAB CT1 #1 30IN (SUTURE) ×4 IMPLANT
SUT MNCRL AB 3-0 PS2 18 (SUTURE) ×2 IMPLANT
SUT MON AB 2-0 CT1 36 (SUTURE) ×2 IMPLANT
SUT STRATAFIX PDO 1 14 VIOLET (SUTURE) ×1
SUT STRATFX PDO 1 14 VIOLET (SUTURE) ×1
SUT VIC AB 2-0 CT1 27 (SUTURE) ×2
SUT VIC AB 2-0 CT1 TAPERPNT 27 (SUTURE) ×2 IMPLANT
SUTURE STRATFX PDO 1 14 VIOLET (SUTURE) ×1 IMPLANT
SYR 50ML LL SCALE MARK (SYRINGE) ×2 IMPLANT
TRAY FOLEY MTR SLVR 16FR STAT (SET/KITS/TRAYS/PACK) IMPLANT
TRI LOC BPS SZ8 W GRIPTON ×2 IMPLANT
WATER STERILE IRR 1000ML POUR (IV SOLUTION) ×2 IMPLANT
YANKAUER SUCT BULB TIP 10FT TU (MISCELLANEOUS) ×2 IMPLANT

## 2018-08-16 NOTE — Plan of Care (Signed)
Plan of care discussed.   

## 2018-08-16 NOTE — Anesthesia Preprocedure Evaluation (Addendum)
Anesthesia Evaluation  Patient identified by MRN, date of birth, ID band Patient awake    Reviewed: Allergy & Precautions, NPO status , Patient's Chart, lab work & pertinent test results, reviewed documented beta blocker date and time   History of Anesthesia Complications (+) PONV  Airway Mallampati: I  TM Distance: >3 FB Neck ROM: Full    Dental no notable dental hx. (+) Teeth Intact, Dental Advisory Given   Pulmonary sleep apnea (does not use CPAP) , former smoker,    Pulmonary exam normal breath sounds clear to auscultation       Cardiovascular hypertension, Pt. on medications and Pt. on home beta blockers negative cardio ROS Normal cardiovascular exam Rhythm:Regular Rate:Normal     Neuro/Psych negative neurological ROS  negative psych ROS   GI/Hepatic GERD  ,(+) Cirrhosis       ,   Endo/Other  diabetes, Type 2, Oral Hypoglycemic Agents  Renal/GU negative Renal ROS  negative genitourinary   Musculoskeletal  (+) Arthritis , Osteoarthritis,    Abdominal   Peds negative pediatric ROS (+)  Hematology negative hematology ROS (+)   Anesthesia Other Findings Avascular necrosis right hip  Reproductive/Obstetrics negative OB ROS                            Anesthesia Physical Anesthesia Plan  ASA: III  Anesthesia Plan: Spinal   Post-op Pain Management:    Induction: Intravenous  PONV Risk Score and Plan: 2 and Ondansetron and Midazolam  Airway Management Planned: Natural Airway and Simple Face Mask  Additional Equipment:   Intra-op Plan:   Post-operative Plan:   Informed Consent: I have reviewed the patients History and Physical, chart, labs and discussed the procedure including the risks, benefits and alternatives for the proposed anesthesia with the patient or authorized representative who has indicated his/her understanding and acceptance.   Dental advisory given  Plan  Discussed with: CRNA  Anesthesia Plan Comments:         Anesthesia Quick Evaluation

## 2018-08-16 NOTE — Anesthesia Procedure Notes (Signed)
Spinal  Patient location during procedure: OR Start time: 08/16/2018 2:44 PM End time: 08/16/2018 2:54 PM Staffing Anesthesiologist: Elmer Picker, MD Performed: anesthesiologist  Preanesthetic Checklist Completed: patient identified, surgical consent, pre-op evaluation, timeout performed, IV checked, risks and benefits discussed and monitors and equipment checked Spinal Block Patient position: sitting Prep: site prepped and draped and DuraPrep Patient monitoring: cardiac monitor, continuous pulse ox and blood pressure Approach: midline Location: L3-4 Injection technique: single-shot Needle Needle type: Pencan  Needle gauge: 22 G Needle length: 9 cm Assessment Sensory level: T6 Additional Notes Functioning IV was confirmed and monitors were applied. Sterile prep and drape, including hand hygiene and sterile gloves were used. The patient was positioned and the spine was prepped. The skin was anesthetized with lidocaine.  Free flow of clear CSF was obtained prior to injecting local anesthetic into the CSF.  The spinal needle aspirated freely following injection.  The needle was carefully withdrawn.  The patient tolerated the procedure well.

## 2018-08-16 NOTE — Anesthesia Postprocedure Evaluation (Signed)
Anesthesia Post Note  Patient: [name]  Procedure(s) Performed: TOTAL HIP ARTHROPLASTY ANTERIOR APPROACH (Right )     Patient location during evaluation: PACU Anesthesia Type: Spinal Level of consciousness: oriented and awake and alert Pain management: pain level controlled Vital Signs Assessment: post-procedure vital signs reviewed and stable Respiratory status: spontaneous breathing, respiratory function stable and patient connected to nasal cannula oxygen Cardiovascular status: blood pressure returned to baseline and stable Postop Assessment: no headache, no backache, no apparent nausea or vomiting and spinal receding Anesthetic complications: no    Last Vitals:  Vitals:   08/16/18 1845 08/16/18 1858  BP: 108/61 111/67  Pulse: 64 62  Resp: 15   Temp: (!) 36.3 C 36.5 C  SpO2: 100% 100%    Last Pain:  Vitals:   08/16/18 1858  TempSrc: Oral  PainSc:                  Ryan P Ellender

## 2018-08-16 NOTE — Discharge Instructions (Signed)
°Dr. Marcellus Pulliam °Joint Replacement Specialist °[facility] °[address] °[place], [place] [address] °[phone] ° ° °TOTAL HIP REPLACEMENT POSTOPERATIVE DIRECTIONS ° ° ° °Hip Rehabilitation, Guidelines Following Surgery  ° °WEIGHT BEARING °Weight bearing as tolerated with assist device (walker, cane, etc) as directed, use it as long as suggested by your surgeon or therapist, typically at least 4-6 weeks. ° °The results of a hip operation are greatly improved after range of motion and muscle strengthening exercises. Follow all safety measures which are given to protect your hip. If any of these exercises cause increased pain or swelling in your joint, decrease the amount until you are comfortable again. Then slowly increase the exercises. Call your caregiver if you have problems or questions.  ° °HOME CARE INSTRUCTIONS  °Most of the following instructions are designed to prevent the dislocation of your new hip.  °Remove items at home which could result in a fall. This includes throw rugs or furniture in walking pathways.  °Continue medications as instructed at time of discharge. °· You may have some home medications which will be placed on hold until you complete the course of blood thinner medication. °· You may start showering once you are discharged home. Do not remove your dressing. °Do not put on socks or shoes without following the instructions of your caregivers.   °Sit on chairs with arms. Use the chair arms to help push yourself up when arising.  °Arrange for the use of a toilet seat elevator so you are not sitting low.  °· Walk with walker as instructed.  °You may resume a sexual relationship in one month or when given the OK by your caregiver.  °Use walker as long as suggested by your caregivers.  °You may put full weight on your legs and walk as much as is comfortable. °Avoid periods of inactivity such as sitting longer than an hour when not asleep. This helps prevent  blood clots.  °You may return to work once you are cleared by your surgeon.  °Do not drive a car for 6 weeks or until released by your surgeon.  °Do not drive while taking narcotics.  °Wear elastic stockings for two weeks following surgery during the day but you may remove then at night.  °Make sure you keep all of your appointments after your operation with all of your doctors and caregivers. You should call the office at the above phone number and make an appointment for approximately two weeks after the date of your surgery. °Please pick up a stool softener and laxative for home use as long as you are requiring pain medications. °· ICE to the affected hip every three hours for 30 minutes at a time and then as needed for pain and swelling. Continue to use ice on the hip for pain and swelling from surgery. You may notice swelling that will progress down to the foot and ankle.  This is normal after surgery.  Elevate the leg when you are not up walking on it.   °It is important for you to complete the blood thinner medication as prescribed by your doctor. °· Continue to use the breathing machine which will help keep your temperature down.  It is common for your temperature to cycle up and down following surgery, especially at night when you are not up moving around and exerting yourself.  The breathing machine keeps your lungs expanded and your temperature down. ° °RANGE OF MOTION AND STRENGTHENING EXERCISES  °These exercises are   designed to help you keep full movement of your hip joint. Follow your caregiver's or physical therapist's instructions. Perform all exercises about fifteen times, three times per day or as directed. Exercise both hips, even if you have had only one joint replacement. These exercises can be done on a training (exercise) mat, on the floor, on a table or on a bed. Use whatever works the best and is most comfortable for you. Use music or television while you are exercising so that the exercises  are a pleasant break in your day. This will make your life better with the exercises acting as a break in routine you can look forward to.  °Lying on your back, slowly slide your foot toward your buttocks, raising your knee up off the floor. Then slowly slide your foot back down until your leg is straight again.  °Lying on your back spread your legs as far apart as you can without causing discomfort.  °Lying on your side, raise your upper leg and foot straight up from the floor as far as is comfortable. Slowly lower the leg and repeat.  °Lying on your back, tighten up the muscle in the front of your thigh (quadriceps muscles). You can do this by keeping your leg straight and trying to raise your heel off the floor. This helps strengthen the largest muscle supporting your knee.  °Lying on your back, tighten up the muscles of your buttocks both with the legs straight and with the knee bent at a comfortable angle while keeping your heel on the floor.  ° °SKILLED REHAB INSTRUCTIONS: °If the patient is transferred to a skilled rehab facility following release from the hospital, a list of the current medications will be sent to the facility for the patient to continue.  When discharged from the skilled rehab facility, please have the facility set up the patient's Home Health Physical Therapy prior to being released. Also, the skilled facility will be responsible for providing the patient with their medications at time of release from the facility to include their pain medication and their blood thinner medication. If the patient is still at the rehab facility at time of the two week follow up appointment, the skilled rehab facility will also need to assist the patient in arranging follow up appointment in our office and any transportation needs. ° °MAKE SURE YOU:  °Understand these instructions.  °Will watch your condition.  °Will get help right away if you are not doing well or get worse. ° °Pick up stool softner and  laxative for home use following surgery while on pain medications. °Do not remove your dressing. °The dressing is waterproof--it is OK to take showers. °Continue to use ice for pain and swelling after surgery. °Do not use any lotions or creams on the incision until instructed by your surgeon. °Total Hip Protocol. ° ° °

## 2018-08-16 NOTE — Op Note (Signed)
OPERATIVE REPORT  SURGEON: Samson Frederic, MD   ASSISTANT: [name], PA-C. Lynford Citizen, RNFA.  PREOPERATIVE DIAGNOSIS: Right hip avascular necrosis with femoral head collapse and acetabular bone loss.   POSTOPERATIVE DIAGNOSIS: Right hip avascular necrosis with femoral head collapse and acetabular bone loss.  PROCEDURE: Right total hip arthroplasty, anterior approach.   IMPLANTS: DePuy Tri Lock stem, size 8, hi offset. DePuy Pinnacle Cup, size 58 mm. DePuy Altrx liner, size 36 by 58 mm, +4 neutral. DePuy Biolox ceramic head ball, size 36 + 8.5 mm. 6.5 mm cancellous bone screw x2.  ANESTHESIA:  Spinal  ESTIMATED BLOOD LOSS:-700 mL    ANTIBIOTICS: 3 g Ancef.  DRAINS: None.  COMPLICATIONS: None.   CONDITION: PACU - hemodynamically stable.   BRIEF CLINICAL NOTE: [name] is a 57 y.o. male with a long-standing history of Right hip avascular necrosis. After failing conservative management, the patient was indicated for total hip arthroplasty. The risks, benefits, and alternatives to the procedure were explained, and the patient elected to proceed.  PROCEDURE IN DETAIL: Surgical site was marked by myself in the pre-op holding area. Once inside the operating room, spinal anesthesia was obtained, and a foley catheter was inserted. The patient was then positioned on the Hana table. All bony prominences were well padded. The hip was prepped and draped in the normal sterile surgical fashion. A time-out was called verifying side and site of surgery. The patient received IV antibiotics within 60 minutes of beginning the procedure.  The direct anterior approach to the hip was performed through the Hueter interval. Lateral femoral circumflex vessels were treated with the Auqumantys. The anterior capsule was exposed and an inverted T capsulotomy was made.The femoral neck cut was made to the level of the templated cut. A corkscrew was placed into the head and the head was  removed. The femoral head was found to be collapsed. The head was passed to the back table and was measured.  Acetabular exposure was achieved, and the pulvinar and labrum were excised. The acetabulum was filled with scar tissue, which I debulked with electrocautery. Sequential reaming of the acetabulum was then performed up to a size 57 mm reamer. A 58 mm cup was then opened and impacted into place at approximately 40 degrees of abduction and 20 degrees of anteversion. I elected to augment the already acceptable press fit fixation with 6.5 mm cancellous bone screws x2. The final polyethylene liner was impacted into place and acetabular osteophytes were removed.   I then gained femoral exposure taking care to protect the abductors and greater trochanter. This was performed using standard external rotation, extension, and adduction. The capsule was peeled off the inner aspect of the greater trochanter, taking care to preserve the short external rotators. A cookie cutter was used to enter the femoral canal, and then the femoral canal finder was placed. Sequential broaching was performed up to a size 8. Calcar planer was used on the femoral neck remnant. I placed a hi offset neck and a trial head ball. The hip was reduced. Leg lengths and offset were checked fluoroscopically. The hip was dislocated and trial components were removed. The final implants were placed, and the hip was reduced.  Fluoroscopy was used to confirm component position and leg lengths. At 90 degrees of external rotation and full extension, the hip was stable to an anterior directed force.  The wound was copiously irrigated with normal saline using pulse lavage. Marcaine solution was injected into the periarticular soft tissue. The wound  was closed in layers using #1 Vicryl and V-Loc for the fascia, 2-0 Vicryl for the subcutaneous fat, 2-0 Monocryl for the deep dermal layer, and staples and Dermabond for the skin. Once the  glue was fully dried, an Aquacell Ag dressing was applied. The patient was transported to the recovery room in stable condition. Sponge, needle, and instrument counts were correct at the end of the case x2. The patient tolerated the procedure well and there were no known complications.  Please note that a surgical assistant was a medical necessity for this procedure to perform it in a safe and expeditious manner. Assistant was necessary to provide appropriate retraction of vital neurovascular structures, to prevent femoral fracture, and to allow for anatomic placement of the prosthesis.

## 2018-08-16 NOTE — Transfer of Care (Signed)
Immediate Anesthesia Transfer of Care Note  Patient: [name]  Procedure(s) Performed: TOTAL HIP ARTHROPLASTY ANTERIOR APPROACH (Right )  Patient Location: PACU  Anesthesia Type:MAC and Spinal  Level of Consciousness: awake, alert , oriented and patient cooperative  Airway & Oxygen Therapy: Patient Spontanous Breathing and Patient connected to face mask oxygen  Post-op Assessment: Report given to RN and Post -op Vital signs reviewed and stable  Post vital signs: Reviewed and stable  Last Vitals:  Vitals Value Taken Time  BP 115/73 08/16/2018  6:09 PM  Temp    Pulse 65 08/16/2018  6:12 PM  Resp 19 08/16/2018  6:12 PM  SpO2 100 % 08/16/2018  6:12 PM  Vitals shown include unvalidated device data.  Last Pain:  Vitals:   08/16/18 1119  TempSrc: Oral  PainSc: 3       Patients Stated Pain Goal: 4 (10/19/70 5366)  Complications: No apparent anesthesia complications

## 2018-08-16 NOTE — Interval H&P Note (Signed)
History and Physical Interval Note:  08/16/2018 1:28 PM  [name]  has presented today for surgery, with the diagnosis of Avascular necrosis right hip  The various methods of treatment have been discussed with the patient and family. After consideration of risks, benefits and other options for treatment, the patient has consented to  Procedure(s): TOTAL HIP ARTHROPLASTY ANTERIOR APPROACH (Right) as a surgical intervention .  The patient's history has been reviewed, patient examined, no change in status, stable for surgery.  I have reviewed the patient's chart and labs.  Questions were answered to the patient's satisfaction.     [name]

## 2018-08-17 LAB — CBC
HCT: 27.3 % — ABNORMAL LOW (ref 39.0–52.0)
HEMOGLOBIN: 8.7 g/dL — AB (ref 13.0–17.0)
MCH: 33.9 pg (ref 26.0–34.0)
MCHC: 31.9 g/dL (ref 30.0–36.0)
MCV: 106.2 fL — ABNORMAL HIGH (ref 80.0–100.0)
NRBC: 0 % (ref 0.0–0.2)
PLATELETS: 224 10*3/uL (ref 150–400)
RBC: 2.57 MIL/uL — AB (ref 4.22–5.81)
RDW: 13.2 % (ref 11.5–15.5)
WBC: 6.3 10*3/uL (ref 4.0–10.5)

## 2018-08-17 LAB — BASIC METABOLIC PANEL
Anion gap: 10 (ref 5–15)
BUN: 10 mg/dL (ref 6–20)
CHLORIDE: 104 mmol/L (ref 98–111)
CO2: 22 mmol/L (ref 22–32)
Calcium: 8 mg/dL — ABNORMAL LOW (ref 8.9–10.3)
Creatinine, Ser: 0.78 mg/dL (ref 0.61–1.24)
Glucose, Bld: 104 mg/dL — ABNORMAL HIGH (ref 70–99)
POTASSIUM: 4.1 mmol/L (ref 3.5–5.1)
SODIUM: 136 mmol/L (ref 135–145)

## 2018-08-17 LAB — GLUCOSE, CAPILLARY
GLUCOSE-CAPILLARY: 105 mg/dL — AB (ref 70–99)
GLUCOSE-CAPILLARY: 169 mg/dL — AB (ref 70–99)
Glucose-Capillary: 212 mg/dL — ABNORMAL HIGH (ref 70–99)
Glucose-Capillary: 212 mg/dL — ABNORMAL HIGH (ref 70–99)

## 2018-08-17 MED ORDER — HYDROCODONE-ACETAMINOPHEN 5-325 MG PO TABS: 1.0000 | ORAL_TABLET | Freq: Four times a day (QID) | ORAL | 0 refills | Status: DC | PRN

## 2018-08-17 MED ORDER — SENNA 8.6 MG PO TABS: 1.0000 | ORAL_TABLET | Freq: Two times a day (BID) | ORAL | 0 refills | Status: DC

## 2018-08-17 MED ORDER — ONDANSETRON HCL 4 MG PO TABS: 4.0000 mg | ORAL_TABLET | Freq: Four times a day (QID) | ORAL | 0 refills | Status: DC | PRN

## 2018-08-17 MED ORDER — ASPIRIN 81 MG PO CHEW: 81.0000 mg | CHEWABLE_TABLET | Freq: Two times a day (BID) | ORAL | 1 refills | Status: DC

## 2018-08-17 MED ORDER — CALCIUM CARBONATE ANTACID 500 MG PO CHEW: 2.0000 | CHEWABLE_TABLET | Freq: Every day | ORAL | Status: DC | PRN

## 2018-08-17 NOTE — Evaluation (Signed)
Physical Therapy Evaluation Patient Details Name: [name] MRN: [id number] DOB: 22-Jul-1961 Today's Date: 08/17/2018   History of Present Illness  Pt s/p R THR and with hx of Bil TKR, multiple toe amputations and OA changes non-op hip  Clinical Impression  Pt s/p R THR and presents with decreased R LE strength/ROM, post op pain, limited ROM L hip and obesity limiting functional mobility.  Pt should progress to dc home with family assist.    Follow Up Recommendations Follow surgeon's recommendation for DC plan and follow-up therapies    Equipment Recommendations  Rolling walker with 5" wheels    Recommendations for Other Services OT consult     Precautions / Restrictions Precautions Precautions: Fall Restrictions Weight Bearing Restrictions: No Other Position/Activity Restrictions: WBAT      Mobility  Bed Mobility Overal bed mobility: Needs Assistance Bed Mobility: Supine to Sit     Supine to sit: +2 for physical assistance;+2 for safety/equipment;Min assist;Mod assist     General bed mobility comments: Increased time with cues for sequence and assist to manage R LE and trunk  Transfers Overall transfer level: Needs assistance   Transfers: Sit to/from Stand Sit to Stand: Min assist;Mod assist;+2 physical assistance;+2 safety/equipment;From elevated surface         General transfer comment: cues for LE management and use of UEs to self assist  Ambulation/Gait Ambulation/Gait assistance: Min assist;+2 safety/equipment Gait Distance (Feet): 20 Feet Assistive device: Rolling walker (2 wheeled) Gait Pattern/deviations: Step-to pattern;Shuffle;Trunk flexed;Decreased step length - right;Decreased step length - left Gait velocity: decr   General Gait Details: Cues for sequence, posture and position from [employer]            Wheelchair Mobility    Modified Rankin (Stroke Patients Only)       Balance Overall balance assessment: Mild deficits  observed, not formally tested                                           Pertinent Vitals/Pain Pain Assessment: 0-10 Pain Score: 3  Pain Location: R hip Pain Descriptors / Indicators: Aching;Sore Pain Intervention(s): Limited activity within patient's tolerance;Monitored during session;Premedicated before session;Ice applied    Home Living Family/patient expects to be discharged to:: Private residence Living Arrangements: Spouse/significant other Available Help at Discharge: Family Type of Home: House Home Access: Stairs to enter Entrance Stairs-Rails: Right Entrance Stairs-Number of Steps: 2 Home Layout: One level Home Equipment: Walker - 4 wheels;Cane - single point;Crutches;Bedside commode      Prior Function Level of Independence: Needs assistance   Gait / Transfers Assistance Needed: RW vs cane vs crutches  ADL's / Homemaking Assistance Needed: assist with lower body dressing        Hand Dominance        Extremity/Trunk Assessment   Upper Extremity Assessment Upper Extremity Assessment: Overall WFL for tasks assessed    Lower Extremity Assessment Lower Extremity Assessment: RLE deficits/detail;LLE deficits/detail RLE Deficits / Details: Strength at hip 2/5 with AAROM at hip to 75 flex and 10 abd LLE Deficits / Details: Arthritic changes at hip with ROM limited all planes     Cervical / Trunk Assessment Cervical / Trunk Assessment: Normal  Communication   Communication: No difficulties  Cognition Arousal/Alertness: Awake/alert Behavior During Therapy: WFL for tasks assessed/performed Overall Cognitive Status: Within Functional Limits for tasks assessed  General Comments      Exercises Total Joint Exercises Ankle Circles/Pumps: AROM;Both;20 reps;Supine Quad Sets: AROM;Both;10 reps;Supine Heel Slides: AAROM;Right;20 reps;Supine Hip ABduction/ADduction: AAROM;Right;15 reps;Supine    Assessment/Plan    PT Assessment Patient needs continued PT services  PT Problem List Decreased strength;Decreased range of motion;Decreased activity tolerance;Decreased mobility;Decreased knowledge of use of DME;Pain;Obesity       PT Treatment Interventions DME instruction;Gait training;Stair training;Functional mobility training;Therapeutic activities;Therapeutic exercise;Patient/family education    PT Goals (Current goals can be found in the Care Plan section)  Acute Rehab PT Goals Patient Stated Goal: Regain IND PT Goal Formulation: With patient Time For Goal Achievement: 08/24/18 Potential to Achieve Goals: Good    Frequency 7X/week   Barriers to discharge        Co-evaluation               AM-PAC PT "6 Clicks" Daily Activity  Outcome Measure Difficulty turning over in bed (including adjusting bedclothes, sheets and blankets)?: Unable Difficulty moving from lying on back to sitting on the side of the bed? : Unable Difficulty sitting down on and standing up from a chair with arms (e.g., wheelchair, bedside commode, etc,.)?: Unable Help needed moving to and from a bed to chair (including a wheelchair)?: A Lot Help needed walking in hospital room?: A Little Help needed climbing 3-5 steps with a railing? : Total 6 Click Score: 9    End of Session Equipment Utilized During Treatment: Gait belt Activity Tolerance: Patient tolerated treatment well Patient left: in chair;with call bell/phone within reach;with family/visitor present Nurse Communication: Mobility status PT Visit Diagnosis: Difficulty in walking, not elsewhere classified (R26.2)    Time: 1610-9604 PT Time Calculation (min) (ACUTE ONLY): 30 min   Charges:   PT Evaluation $PT Eval Low Complexity: 1 Low PT Treatments $Therapeutic Exercise: 8-22 mins        [name] PT Acute Rehabilitation Services Pager [phone] Office [phone]   Lititia Sen 08/17/2018, 1:39 PM

## 2018-08-17 NOTE — Discharge Summary (Signed)
Physician Discharge Summary  Patient ID: [name] MRN: [id number] DOB/AGE: Jun 20, 1961 57 y.o.  Admit date: 08/16/2018 Discharge date: 08/18/2018  Admission Diagnoses:  Avascular necrosis of hip, right [facility])  Discharge Diagnoses:  Principal Problem:   Avascular necrosis of hip, right [facility])   Past Medical History:  Diagnosis Date  . Alcohol dependence (HCC)   . Arthritis    hips, hands  . Bilateral carpal tunnel syndrome   . Bilateral leg edema    Chronic  . Diverticulitis    portion of colon removed  . DOE (dyspnea on exertion)    occ  . Elevated liver enzymes   . Exstrophy of bladder 10/2013   pt unaware of this diagnosis  . Fatty liver   . GERD (gastroesophageal reflux disease)    occ  . Hammer toe   . Hip pain   . History of ventral hernia repair 2016   x2  . Hyperlipidemia    pt unsure  . Hypertension   . Marijuana abuse   . Morbid obesity (HCC)   . Neuromuscular disorder (HCC)    peripheral neuropathy  . OSA (obstructive sleep apnea)    has OSA-not used CPAP 2-3 yrs  . PONV (postoperative nausea and vomiting)   . Pre-diabetes   . Toe ulcer (HCC)    left    Surgeries: Procedure(s): TOTAL HIP ARTHROPLASTY ANTERIOR APPROACH on 08/16/2018   Consultants (if any):   Discharged Condition: Improved  Hospital Course: [name] is an 57 y.o. male who was admitted 08/16/2018 with a diagnosis of Avascular necrosis of hip, right (HCC) and went to the operating room on 08/16/2018 and underwent the above named procedures.    He was given perioperative antibiotics:  Anti-infectives (From admission, onward)   Start     Dose/Rate Route Frequency Ordered Stop   08/16/18 2000  ceFAZolin (ANCEF) IVPB 2g/100 mL premix     2 g 200 mL/hr over 30 Minutes Intravenous Every 6 hours 08/16/18 1854 08/17/18 0300   08/16/18 0600  ceFAZolin (ANCEF) 3 g in dextrose 5 % 50 mL IVPB     3 g 100 mL/hr over 30 Minutes Intravenous 30 min pre-op 08/15/18 1405 08/16/18 1446     .  He was given sequential compression devices, early ambulation, and ASA for DVT prophylaxis.  He benefited maximally from the hospital stay and there were no complications.    Recent vital signs:  Vitals:   08/18/18 0822 08/18/18 1234  BP: 128/74 (!) 113/95  Pulse: 71 81  Resp:  16  Temp:  98.1 F (36.7 C)  SpO2:  97%    Recent laboratory studies:  Lab Results  Component Value Date   HGB 7.6 (L) 08/18/2018   HGB 8.7 (L) 08/17/2018   HGB 12.1 (L) 08/11/2018   Lab Results  Component Value Date   WBC 8.9 08/18/2018   PLT 214 08/18/2018   No results found for: INR Lab Results  Component Value Date   NA 136 08/17/2018   K 4.1 08/17/2018   CL 104 08/17/2018   CO2 22 08/17/2018   BUN 10 08/17/2018   CREATININE 0.78 08/17/2018   GLUCOSE 104 (H) 08/17/2018    Discharge Medications:   Allergies as of 08/18/2018      Reactions   Claritin [loratadine] Shortness Of Breath      Medication List    STOP taking these medications   acetaminophen 650 MG CR tablet Commonly known as:  TYLENOL   BLUE-EMU MAXIMUM STRENGTH  EX   traMADol 50 MG tablet Commonly known as:  ULTRAM     TAKE these medications   aspirin 81 MG chewable tablet Chew 1 tablet (81 mg total) by mouth 2 (two) times daily.   calcium carbonate 500 MG chewable tablet Commonly known as:  TUMS - dosed in mg elemental calcium Chew 2 tablets by mouth daily as needed for indigestion or heartburn.   diphenhydrAMINE 25 MG tablet Commonly known as:  BENADRYL Take 25 mg by mouth at bedtime as needed for sleep.   DULoxetine 20 MG capsule Commonly known as:  CYMBALTA TAKE ONE CAPSULE BY MOUTH EVERY DAY   folic acid 1 MG tablet Commonly known as:  FOLVITE Take 1 tablet (1 mg total) by mouth daily.   furosemide 20 MG tablet Commonly known as:  LASIX Take 2 tablets (40 mg total) by mouth daily.   HYDROcodone-acetaminophen 5-325 MG tablet Commonly known as:  NORCO/VICODIN Take 1-2 tablets by mouth  every 6 (six) hours as needed for moderate pain. What changed:    how much to take  reasons to take this   losartan 50 MG tablet Commonly known as:  COZAAR Take 1 tablet (50 mg total) by mouth daily.   metFORMIN 500 MG tablet Commonly known as:  GLUCOPHAGE Take 1 tablet (500 mg total) by mouth 2 (two) times daily with a meal.   metoprolol tartrate 100 MG tablet Commonly known as:  LOPRESSOR TAKE 1 TABLET (100 MG TOTAL) BY MOUTH 2 (TWO) TIMES DAILY.   multivitamin with minerals Tabs tablet Take 1 tablet by mouth daily.   ondansetron 4 MG tablet Commonly known as:  ZOFRAN Take 1 tablet (4 mg total) by mouth every 6 (six) hours as needed for nausea.   OVER THE COUNTER MEDICATION Apply 1 application topically daily. Hemp Extract Cream   saccharomyces boulardii 250 MG capsule Commonly known as:  FLORASTOR Take 1 capsule (250 mg total) by mouth 2 (two) times daily.   senna 8.6 MG Tabs tablet Commonly known as:  SENOKOT Take 1 tablet (8.6 mg total) by mouth 2 (two) times daily.   STOOL SOFTENER 100 MG capsule Generic drug:  Docusate Sodium Take 100 mg by mouth 2 (two) times daily as needed for constipation.   thiamine 100 MG tablet Take 1 tablet (100 mg total) by mouth daily.            Durable Medical Equipment  (From admission, onward)         Start     Ordered   08/17/18 1120  For home use only DME Walker wide  Once    Question:  Patient needs a walker to treat with the following condition  Answer:  History of orthopedic surgery   08/17/18 1120          Diagnostic Studies: Dg Pelvis Portable  Result Date: 08/16/2018 CLINICAL DATA:  Postop right anterior approach hip replacement. EXAM: PORTABLE PELVIS 1-2 VIEWS COMPARISON:  Intraoperative study from earlier on the same day. FINDINGS: Right uncemented total hip arthroplasty with postop soft tissue emphysema. No malalignment or hardware failure. Marked flattening of the weight-bearing portion left femoral  head consistent with advanced changes of AVN. Surgical tacks from prior hernia repair noted projecting the left lower quadrant. IMPRESSION: No complicating features status post right hip arthroplasty. AVN of the left femoral head with flattening. Electronically Signed   By: Tollie Eth M.D.   On: 08/16/2018 18:54   Dg C-arm 1-60 Min-no Report  Result  Date: 08/16/2018 Fluoroscopy was utilized by the requesting physician.  No radiographic interpretation.   Dg Hip Operative Unilat W Or W/o Pelvis Right  Result Date: 08/16/2018 CLINICAL DATA:  Right hip replacement EXAM: OPERATIVE RIGHT HIP (WITH PELVIS IF PERFORMED) 12 VIEWS TECHNIQUE: Fluoroscopic spot image(s) were submitted for interpretation post-operatively. COMPARISON:  11/15/2017 radiographs FINDINGS: 18 seconds of fluoroscopic time and 4.61 mGy of radiation dosage utilized for intraoperative images acquired during right hip arthroplasty. A total uncemented right hip arthroplasty is noted at the conclusion of this study without any injury is identified. Flattened appearance of the native left femoral head consistent advanced changes of avascular necrosis are also included on some of the images. IMPRESSION: 1. Fluoroscopic time utilized for right hip arthroplasty. No immediate intraoperative complications noted. 2. AVN of the native left hip with marked flattening of weight-bearing portion left femoral head. Electronically Signed   By: Tollie Eth M.D.   On: 08/16/2018 18:56    Disposition: Discharge disposition: 01-Home or Self Care       Discharge Instructions    Call MD / Call 911   Complete by:  As directed    If you experience chest pain or shortness of breath, CALL 911 and be transported to the hospital emergency room.  If you develope a fever above 101 F, pus (white drainage) or increased drainage or redness at the wound, or calf pain, call your surgeon's office.   Constipation Prevention   Complete by:  As directed    Drink  plenty of fluids.  Prune juice may be helpful.  You may use a stool softener, such as Colace (over the counter) 100 mg twice a day.  Use MiraLax (over the counter) for constipation as needed.   Diet - low sodium heart healthy   Complete by:  As directed    Driving restrictions   Complete by:  As directed    No driving for 6 weeks   Increase activity slowly as tolerated   Complete by:  As directed    Lifting restrictions   Complete by:  As directed    No lifting for 6 weeks   TED hose   Complete by:  As directed    Use stockings (TED hose) for 2 weeks on both leg(s).  You may remove them at night for sleeping.      Follow-up Information    Merna Baldi, Arlys John, MD. Schedule an appointment as soon as possible for a visit in 2 weeks.   Specialty:  Orthopedic Surgery Why:  For wound re-check, For suture removal Contact information: [address] [place] [address] [phone]        [facility] Follow up.   Specialty:  Home Health Services Why:  physical therapy Contact information: [address] [place] [address] [phone]            Signed: Iline Oven [place] Skalicky 08/18/2018, 1:11 PM

## 2018-08-17 NOTE — Care Management Note (Signed)
Case Management Note  Patient Details  Name: [name] MRN: [id number] Date of Birth: 08/18/1961  Subjective/Objective:       Discharge planning, spoke with patient and spouse at bedside. [facility] chosen [facility] for [name] PT, evaluate and treat.   Action/Plan: Contacted [facility] for referral. They have accepted. Needs wide RW, contacted [facility] to deliver to room. [phone]             Expected Discharge Date:  08/17/18               Expected Discharge Plan:  Home w Home Health Services  In-House Referral:  NA  Discharge planning Services  CM Consult  Post Acute Care Choice:  Home Health, Durable Medical Equipment Choice offered to:  Patient  DME Arranged:  [name] wide DME Agency:  [facility]  HH Arranged:  PT [facility] Agency:     Status of Service:  Completed, signed off  If discussed at Long Length of Stay Meetings, dates discussed:    Additional Comments:  Alexis Goodell, RN 08/17/2018, 11:18 AM

## 2018-08-17 NOTE — Progress Notes (Signed)
Physical Therapy Treatment Patient Details Name: [name] MRN: [id number] DOB: September 18, 1961 Today's Date: 08/17/2018    History of Present Illness Pt s/p R THR and with hx of Bil TKR, multiple toe amputations and OA changes non-op hip    PT Comments    Pt progressing well with mobility including in/out of bed and increased ambulation in halls.   Follow Up Recommendations  Follow surgeon's recommendation for DC plan and follow-up therapies     Equipment Recommendations  Rolling walker with 5" wheels(wide RW please)    Recommendations for Other Services OT consult     Precautions / Restrictions Precautions Precautions: Fall Restrictions Weight Bearing Restrictions: No Other Position/Activity Restrictions: WBAT    Mobility  Bed Mobility Overal bed mobility: Needs Assistance Bed Mobility: Supine to Sit;Sit to Supine     Supine to sit: Min assist Sit to supine: Min assist;Mod assist   General bed mobility comments: Increased time with cues for sequence and assist to manage R LE   Transfers Overall transfer level: Needs assistance Equipment used: Rolling walker (2 wheeled) Transfers: Sit to/from Stand Sit to Stand: Min assist;Mod assist;From elevated surface         General transfer comment: cues for LE management and use of UEs to self assist  Ambulation/Gait Ambulation/Gait assistance: Min assist Gait Distance (Feet): 74 Feet Assistive device: Rolling walker (2 wheeled) Gait Pattern/deviations: Step-to pattern;Shuffle;Trunk flexed;Decreased step length - right;Decreased step length - left Gait velocity: decr   General Gait Details: Cues for sequence, posture and position from [employer]             Wheelchair Mobility    Modified Rankin (Stroke Patients Only)       Balance Overall balance assessment: Mild deficits observed, not formally tested                                          Cognition Arousal/Alertness:  Awake/alert Behavior During Therapy: WFL for tasks assessed/performed Overall Cognitive Status: Within Functional Limits for tasks assessed                                        Exercises Total Joint Exercises Ankle Circles/Pumps: AROM;Both;20 reps;Supine Quad Sets: AROM;Both;10 reps;Supine Heel Slides: AAROM;Right;20 reps;Supine Hip ABduction/ADduction: AAROM;Right;15 reps;Supine    General Comments        Pertinent Vitals/Pain Pain Assessment: 0-10 Pain Score: 4  Pain Location: R hip Pain Descriptors / Indicators: Aching;Sore Pain Intervention(s): Limited activity within patient's tolerance;Monitored during session;Premedicated before session;Ice applied    Home Living Family/patient expects to be discharged to:: Private residence Living Arrangements: Spouse/significant other Available Help at Discharge: Family Type of Home: House Home Access: Stairs to enter Entrance Stairs-Rails: Right Home Layout: One level Home Equipment: Environmental consultant - 4 wheels;Cane - single point;Crutches;Bedside commode      Prior Function Level of Independence: Needs assistance  Gait / Transfers Assistance Needed: RW vs cane vs crutches ADL's / Homemaking Assistance Needed: assist with lower body dressing     PT Goals (current goals can now be found in the care plan section) Acute Rehab PT Goals Patient Stated Goal: Regain IND PT Goal Formulation: With patient Time For Goal Achievement: 08/24/18 Potential to Achieve Goals: Good Progress towards PT goals: Progressing toward goals  Frequency    7X/week      PT Plan Current plan remains appropriate    Co-evaluation              AM-PAC PT "6 Clicks" Daily Activity  Outcome Measure  Difficulty turning over in bed (including adjusting bedclothes, sheets and blankets)?: Unable Difficulty moving from lying on back to sitting on the side of the bed? : Unable Difficulty sitting down on and standing up from a chair  with arms (e.g., wheelchair, bedside commode, etc,.)?: Unable Help needed moving to and from a bed to chair (including a wheelchair)?: A Little Help needed walking in hospital room?: A Little Help needed climbing 3-5 steps with a railing? : Total 6 Click Score: 10    End of Session Equipment Utilized During Treatment: Gait belt Activity Tolerance: Patient tolerated treatment well Patient left: with call bell/phone within reach;with family/visitor present;in bed Nurse Communication: Mobility status PT Visit Diagnosis: Difficulty in walking, not elsewhere classified (R26.2)     Time: 1430-1500 PT Time Calculation (min) (ACUTE ONLY): 30 min  Charges:  $Gait Training: 23-37 mins $Therapeutic Exercise: 8-22 mins                     [name] PT Acute Rehabilitation Services Pager [phone] Office [phone]     [name] 08/17/2018, 3:43 PM

## 2018-08-17 NOTE — Progress Notes (Addendum)
    Subjective:  Patient reports pain as mild to moderate.  Denies N/V/CP/SOB. No c/o.  Objective:   VITALS:   Vitals:   08/16/18 2202 08/17/18 0134 08/17/18 0550 08/17/18 0934  BP: 92/63 (!) 151/69 126/69 138/80  Pulse: 71 79 76 86  Resp: 16 18 16 18   Temp: 97.9 F (36.6 C) 98.1 F (36.7 C) 97.6 F (36.4 C) 97.9 F (36.6 C)  TempSrc: Axillary Oral Oral Oral  SpO2: 98% 100% 100% 97%  Weight:      Height:        NAD ABD soft Sensation intact distally Intact pulses distally Dorsiflexion/Plantar flexion intact Incision: dressing C/D/I Compartment soft   Lab Results  Component Value Date   WBC 6.3 08/17/2018   HGB 8.7 (L) 08/17/2018   HCT 27.3 (L) 08/17/2018   MCV 106.2 (H) 08/17/2018   PLT 224 08/17/2018   BMET    Component Value Date/Time   NA 136 08/17/2018 0514   K 4.1 08/17/2018 0514   CL 104 08/17/2018 0514   CO2 22 08/17/2018 0514   GLUCOSE 104 (H) 08/17/2018 0514   BUN 10 08/17/2018 0514   CREATININE 0.78 08/17/2018 0514   CALCIUM 8.0 (L) 08/17/2018 0514   GFRNONAA >60 08/17/2018 0514   GFRAA >60 08/17/2018 0514   Anticipated LOS equal to or greater than 2 midnights due to - Age 57 and older with one or more of the following:  - Obesity  - Expected need for hospital services (PT, OT, Nursing) required for safe  discharge  - Anticipated need for postoperative skilled nursing care or inpatient rehab  - Active co-morbidities: Diabetes OR   - Unanticipated findings during/Post Surgery: None  - Patient is a high risk of re-admission due to: None    Assessment/Plan: 1 Day Post-Op   Principal Problem:   Avascular necrosis of hip, right (HCC)   WBAT with walker DVT ppx: Aspirin, SCDs, TEDS PO pain control PT/OT Dispo: D/C home tomorrow with HEP   Iline Oven Arel Tippen 08/17/2018, 10:30 AM   Samson Frederic, MD Cell: [phone] [facility] is now [facility]  [place] [address], [address], [place], [place]  [address] Phone: [phone] [email or web address] Facebook  [employer]

## 2018-08-18 ENCOUNTER — Encounter (HOSPITAL_COMMUNITY): Payer: Self-pay | Admitting: Orthopedic Surgery

## 2018-08-18 LAB — CBC
HCT: 23.7 % — ABNORMAL LOW (ref 39.0–52.0)
Hemoglobin: 7.6 g/dL — ABNORMAL LOW (ref 13.0–17.0)
MCH: 33.6 pg (ref 26.0–34.0)
MCHC: 32.1 g/dL (ref 30.0–36.0)
MCV: 104.9 fL — ABNORMAL HIGH (ref 80.0–100.0)
NRBC: 0 % (ref 0.0–0.2)
Platelets: 214 10*3/uL (ref 150–400)
RBC: 2.26 MIL/uL — ABNORMAL LOW (ref 4.22–5.81)
RDW: 13.2 % (ref 11.5–15.5)
WBC: 8.9 10*3/uL (ref 4.0–10.5)

## 2018-08-18 LAB — GLUCOSE, CAPILLARY
GLUCOSE-CAPILLARY: 139 mg/dL — AB (ref 70–99)
Glucose-Capillary: 111 mg/dL — ABNORMAL HIGH (ref 70–99)

## 2018-08-18 NOTE — Evaluation (Signed)
Occupational Therapy Evaluation Patient Details Name: [name] MRN: [id number] DOB: 1960/12/11 Today's Date: 08/18/2018    History of Present Illness Pt s/p R THR and with hx of Bil TKR, multiple toe amputations and OA changes non-op hip   Clinical Impression   This 57 year old man was admitted for the above sx.  All education was completed this session. No further OT is needed at this time    Follow Up Recommendations  Supervision/Assistance - 24 hour    Equipment Recommendations  None recommended by OT    Recommendations for Other Services       Precautions / Restrictions Precautions Precautions: Fall Restrictions Weight Bearing Restrictions: No Other Position/Activity Restrictions: WBAT      Mobility Bed Mobility         Supine to sit: Min guard Sit to supine: Min assist   General bed mobility comments: light assist for leg  Transfers   Equipment used: Rolling walker (2 wheeled)   Sit to Stand: Min guard;From elevated surface         General transfer comment: for safety    Balance                                           ADL either performed or assessed with clinical judgement   ADL Overall ADL's : Needs assistance/impaired Eating/Feeding: Independent   Grooming: Wash/dry hands;Wash/dry face;Supervision/safety;Standing   Upper Body Bathing: Supervision/ safety;Standing   Lower Body Bathing: Moderate assistance;Sit to/from stand   Upper Body Dressing : Minimal assistance;Standing   Lower Body Dressing: Maximal assistance;Sit to/from stand   Toilet Transfer: Min guard;Ambulation;RW(back to bed)   Toileting- Clothing Manipulation and Hygiene: Minimal assistance;Sit to/from stand         General ADL Comments: ambulated to bathroom, stood at sink and completed adl. Educated on shower sequence. He has been washing outside of shower with girlfriend's assistance. Educated on sidestepping through tight spaces, safety:   bathmat and rubber backed rug outside of shower     Vision         Perception     Praxis      Pertinent Vitals/Pain Pain Score: 4  Pain Location: R hip Pain Descriptors / Indicators: Aching;Sore Pain Intervention(s): Limited activity within patient's tolerance;Monitored during session;Premedicated before session;Repositioned     Hand Dominance     Extremity/Trunk Assessment             Communication Communication Communication: No difficulties   Cognition Arousal/Alertness: Awake/alert Behavior During Therapy: WFL for tasks assessed/performed Overall Cognitive Status: Within Functional Limits for tasks assessed                                     General Comments       Exercises     Shoulder Instructions      Home Living Family/patient expects to be discharged to:: Private residence Living Arrangements: Spouse/significant other Available Help at Discharge: Family               Bathroom Shower/Tub: Walk-in Psychologist, prison and probation services: Standard     Home Equipment: Environmental consultant - 4 wheels;Cane - single point;Crutches;Bedside commode(AE kit)          Prior Functioning/Environment      ADL's / Homemaking Assistance Needed: assist with bathing/LE dressing:  girlfriend   Has AE            OT Problem List:        OT Treatment/Interventions:      OT Goals(Current goals can be found in the care plan section) Acute Rehab OT Goals Patient Stated Goal: Regain IND OT Goal Formulation: All assessment and education complete, DC therapy  OT Frequency:     Barriers to D/C:            Co-evaluation              AM-PAC PT "6 Clicks" Daily Activity     Outcome Measure Help from another person eating meals?: None Help from another person taking care of personal grooming?: A Little Help from another person toileting, which includes using toliet, bedpan, or urinal?: A Little Help from another person bathing (including washing,  rinsing, drying)?: A Lot Help from another person to put on and taking off regular upper body clothing?: A Little Help from another person to put on and taking off regular lower body clothing?: A Lot 6 Click Score: 17   End of Session    Activity Tolerance: Patient tolerated treatment well Patient left: in bed;with call bell/phone within reach  OT Visit Diagnosis: Pain Pain - Right/Left: Right Pain - part of body: Hip                Time: 3094-0768 OT Time Calculation (min): 29 min Charges:  OT General Charges $OT Visit: 1 Visit OT Evaluation $OT Eval Low Complexity: 1 Low OT Treatments $Self Care/Home Management : 8-22 mins  Lesle Chris, OTR/L Acute Rehabilitation Services [phone] [facility] pager [phone] office 08/18/2018  [place] 08/18/2018, 10:31 AM

## 2018-08-18 NOTE — Progress Notes (Signed)
Physical Therapy Treatment Patient Details Name: [name] MRN: [id number] DOB: 1961-04-19 Today's Date: 08/18/2018    History of Present Illness Pt s/p R THR and with hx of Bil TKR, multiple toe amputations and OA changes non-op hip    PT Comments    Pt progressing steadily with mobility.  Will consider stairs this pm.   Follow Up Recommendations  Follow surgeon's recommendation for DC plan and follow-up therapies     Equipment Recommendations  Rolling walker with 5" wheels    Recommendations for Other Services OT consult     Precautions / Restrictions Precautions Precautions: Fall Restrictions Weight Bearing Restrictions: No Other Position/Activity Restrictions: WBAT    Mobility  Bed Mobility Overal bed mobility: Needs Assistance Bed Mobility: Supine to Sit     Supine to sit: Supervision Sit to supine: Min assist   General bed mobility comments: increased time but no physical assist required  Transfers Overall transfer level: Needs assistance Equipment used: Rolling walker (2 wheeled) Transfers: Sit to/from Stand Sit to Stand: Min guard;From elevated surface         General transfer comment: for safety  Ambulation/Gait Ambulation/Gait assistance: Min guard Gait Distance (Feet): 111 Feet Assistive device: Rolling walker (2 wheeled) Gait Pattern/deviations: Shuffle;Trunk flexed;Decreased step length - right;Decreased step length - left;Step-to pattern;Step-through pattern Gait velocity: decr   General Gait Details: Cues for sequence, posture and position from [employer] Mobility    Modified Rankin (Stroke Patients Only)       Balance Overall balance assessment: Mild deficits observed, not formally tested                                          Cognition Arousal/Alertness: Awake/alert Behavior During Therapy: WFL for tasks assessed/performed Overall Cognitive Status: Within Functional  Limits for tasks assessed                                        Exercises Total Joint Exercises Ankle Circles/Pumps: AROM;Both;20 reps;Supine Quad Sets: AROM;Both;10 reps;Supine Heel Slides: AAROM;Right;20 reps;Supine Hip ABduction/ADduction: AAROM;Right;15 reps;Supine    General Comments        Pertinent Vitals/Pain Pain Assessment: 0-10 Pain Score: 4  Pain Location: R hip Pain Descriptors / Indicators: Aching;Sore Pain Intervention(s): Limited activity within patient's tolerance;Monitored during session;Premedicated before session;Ice applied    Home Living Family/patient expects to be discharged to:: Private residence Living Arrangements: Spouse/significant other Available Help at Discharge: Family         Home Equipment: [name] - 4 wheels;Cane - single point;Crutches;Bedside commode(AE kit)      Prior Function      ADL's / Homemaking Assistance Needed: assist with bathing/LE dressing:  girlfriend   Has AE     PT Goals (current goals can now be found in the care plan section) Acute Rehab PT Goals Patient Stated Goal: Regain IND PT Goal Formulation: With patient Time For Goal Achievement: 08/24/18 Potential to Achieve Goals: Good Progress towards PT goals: Progressing toward goals    Frequency    7X/week      PT Plan Current plan remains appropriate    Co-evaluation              AM-PAC PT "6 Clicks"  Daily Activity  Outcome Measure  Difficulty turning over in bed (including adjusting bedclothes, sheets and blankets)?: A Lot Difficulty moving from lying on back to sitting on the side of the bed? : A Lot Difficulty sitting down on and standing up from a chair with arms (e.g., wheelchair, bedside commode, etc,.)?: A Lot Help needed moving to and from a bed to chair (including a wheelchair)?: A Little Help needed walking in hospital room?: A Little Help needed climbing 3-5 steps with a railing? : A Lot 6 Click Score: 14    End  of Session Equipment Utilized During Treatment: Gait belt Activity Tolerance: Patient tolerated treatment well Patient left: with call bell/phone within reach;in chair Nurse Communication: Mobility status PT Visit Diagnosis: Difficulty in walking, not elsewhere classified (R26.2)     Time: 0762-2633 PT Time Calculation (min) (ACUTE ONLY): 31 min  Charges:  $Gait Training: 8-22 mins $Therapeutic Exercise: 8-22 mins                     [place] Pager [phone] Office [phone]    Navid Lenzen 08/18/2018, 12:27 PM

## 2018-08-18 NOTE — Progress Notes (Signed)
The pt was provided with d/c instructions and prescriptions. After discussing the pt's plan of care upon d/c home, the pt reported no further questions or concerns.  

## 2018-08-18 NOTE — Progress Notes (Signed)
Physical Therapy Treatment Patient Details Name: [name] MRN: [id number] DOB: 12-16-1960 Today's Date: 08/18/2018    History of Present Illness Pt s/p R THR and with hx of Bil TKR, multiple toe amputations and OA changes non-op hip    PT Comments    Pt reviewed car transfers and stair and stoop with written instruction provided.     Follow Up Recommendations  Follow surgeon's recommendation for DC plan and follow-up therapies     Equipment Recommendations  Rolling walker with 5" wheels    Recommendations for Other Services OT consult     Precautions / Restrictions Precautions Precautions: Fall Restrictions Weight Bearing Restrictions: No Other Position/Activity Restrictions: WBAT    Mobility  Bed Mobility Overal bed mobility: Needs Assistance Bed Mobility: Supine to Sit;Sit to Supine     Supine to sit: Supervision Sit to supine: Min guard   General bed mobility comments: increased time but no physical assist required  Transfers Overall transfer level: Needs assistance Equipment used: Rolling walker (2 wheeled) Transfers: Sit to/from Stand Sit to Stand: Supervision;From elevated surface         General transfer comment: for safety  Ambulation/Gait Ambulation/Gait assistance: Min guard;Supervision Gait Distance (Feet): 60 Feet Assistive device: Rolling walker (2 wheeled) Gait Pattern/deviations: Shuffle;Trunk flexed;Decreased step length - right;Decreased step length - left;Step-to pattern;Step-through pattern Gait velocity: decr   General Gait Details: min cues for sequence, posture and position from [employer] Stairs: Yes Stairs assistance: Min assist Stair Management: One rail Right;No rails;Step to pattern;Forwards;With walker;With crutches Number of Stairs: 3 General stair comments: 2 stairs with rail and crutch; single step with RW.  Cues for sequence and foot/crutch/RW placement   Wheelchair Mobility    Modified Rankin (Stroke  Patients Only)       Balance Overall balance assessment: Mild deficits observed, not formally tested                                          Cognition Arousal/Alertness: Awake/alert Behavior During Therapy: WFL for tasks assessed/performed Overall Cognitive Status: Within Functional Limits for tasks assessed                                        Exercises Total Joint Exercises Ankle Circles/Pumps: AROM;Both;20 reps;Supine Quad Sets: AROM;Both;10 reps;Supine Heel Slides: AAROM;Right;20 reps;Supine Hip ABduction/ADduction: AAROM;Right;15 reps;Supine    General Comments        Pertinent Vitals/Pain Pain Assessment: 0-10 Pain Score: 4  Pain Location: R hip Pain Descriptors / Indicators: Aching;Sore Pain Intervention(s): Limited activity within patient's tolerance;Monitored during session;Premedicated before session    Home Living                      Prior Function            PT Goals (current goals can now be found in the care plan section) Acute Rehab PT Goals Patient Stated Goal: Regain IND PT Goal Formulation: With patient Time For Goal Achievement: 08/24/18 Potential to Achieve Goals: Good Progress towards PT goals: Progressing toward goals    Frequency    7X/week      PT Plan Current plan remains appropriate    Co-evaluation              AM-PAC PT "6  Clicks" Daily Activity  Outcome Measure  Difficulty turning over in bed (including adjusting bedclothes, sheets and blankets)?: A Lot Difficulty moving from lying on back to sitting on the side of the bed? : A Lot Difficulty sitting down on and standing up from a chair with arms (e.g., wheelchair, bedside commode, etc,.)?: A Lot Help needed moving to and from a bed to chair (including a wheelchair)?: A Little Help needed walking in hospital room?: A Little Help needed climbing 3-5 steps with a railing? : A Little 6 Click Score: 15    End of Session  Equipment Utilized During Treatment: Gait belt Activity Tolerance: Patient tolerated treatment well Patient left: with call bell/phone within reach;in bed Nurse Communication: Mobility status PT Visit Diagnosis: Difficulty in walking, not elsewhere classified (R26.2)     Time: 1610-9604 PT Time Calculation (min) (ACUTE ONLY): 35 min  Charges:  $Gait Training: 8-22 mins $Therapeutic Exercise: 8-22 mins $Therapeutic Activity: 8-22 mins                     [name] PT Acute Rehabilitation Services Pager [phone] Office [phone]    Krisi Azua 08/18/2018, 2:12 PM

## 2018-08-18 NOTE — Progress Notes (Signed)
    Subjective:  Patient reports pain as mild to moderate.  Denies N/V/CP/SOB. No c/o.  Objective:   VITALS:   Vitals:   08/18/18 0215 08/18/18 0623 08/18/18 0822 08/18/18 1234  BP: 128/76 134/77 128/74 (!) 113/95  Pulse: 74 67 71 81  Resp: 16 20  16   Temp: 98.2 F (36.8 C) 97.6 F (36.4 C)  98.1 F (36.7 C)  TempSrc: Oral Oral  Oral  SpO2: 96% 96%  97%  Weight:      Height:        NAD ABD soft Sensation intact distally Intact pulses distally Dorsiflexion/Plantar flexion intact Incision: dressing C/D/I Compartment soft   Lab Results  Component Value Date   WBC 8.9 08/18/2018   HGB 7.6 (L) 08/18/2018   HCT 23.7 (L) 08/18/2018   MCV 104.9 (H) 08/18/2018   PLT 214 08/18/2018   BMET    Component Value Date/Time   NA 136 08/17/2018 0514   K 4.1 08/17/2018 0514   CL 104 08/17/2018 0514   CO2 22 08/17/2018 0514   GLUCOSE 104 (H) 08/17/2018 0514   BUN 10 08/17/2018 0514   CREATININE 0.78 08/17/2018 0514   CALCIUM 8.0 (L) 08/17/2018 0514   GFRNONAA >60 08/17/2018 0514   GFRAA >60 08/17/2018 0514   Anticipated LOS equal to or greater than 2 midnights due to - Age 58 and older with one or more of the following:  - Obesity  - Expected need for hospital services (PT, OT, Nursing) required for safe  discharge  - Anticipated need for postoperative skilled nursing care or inpatient rehab  - Active co-morbidities: Diabetes OR   - Unanticipated findings during/Post Surgery: None  - Patient is a high risk of re-admission due to: None    Assessment/Plan: 2 Days Post-Op   Principal Problem:   Avascular necrosis of hip, right (HCC)   WBAT with walker DVT ppx: Aspirin, SCDs, TEDS PO pain control PT/OT Dispo: D/C home with HEP   Iline Oven Sharol Croghan 08/18/2018, 1:09 PM   Samson Frederic, MD Cell: [phone] [facility] is now [facility]  [place] [address], [address], [place], [place] [address] Phone:  [phone] [email or web address] Facebook  [employer]

## 2018-08-21 DIAGNOSIS — G629 Polyneuropathy, unspecified: Secondary | ICD-10-CM | POA: Diagnosis not present

## 2018-08-21 DIAGNOSIS — Z87891 Personal history of nicotine dependence: Secondary | ICD-10-CM | POA: Diagnosis not present

## 2018-08-21 DIAGNOSIS — F1210 Cannabis abuse, uncomplicated: Secondary | ICD-10-CM | POA: Diagnosis not present

## 2018-08-21 DIAGNOSIS — Z96641 Presence of right artificial hip joint: Secondary | ICD-10-CM | POA: Diagnosis not present

## 2018-08-21 DIAGNOSIS — I10 Essential (primary) hypertension: Secondary | ICD-10-CM | POA: Diagnosis not present

## 2018-08-21 DIAGNOSIS — Z471 Aftercare following joint replacement surgery: Secondary | ICD-10-CM | POA: Diagnosis not present

## 2018-08-21 DIAGNOSIS — Z6836 Body mass index (BMI) 36.0-36.9, adult: Secondary | ICD-10-CM | POA: Diagnosis not present

## 2018-08-21 DIAGNOSIS — Z89421 Acquired absence of other right toe(s): Secondary | ICD-10-CM | POA: Diagnosis not present

## 2018-08-21 DIAGNOSIS — E785 Hyperlipidemia, unspecified: Secondary | ICD-10-CM | POA: Diagnosis not present

## 2018-08-21 DIAGNOSIS — Z96653 Presence of artificial knee joint, bilateral: Secondary | ICD-10-CM | POA: Diagnosis not present

## 2018-08-21 DIAGNOSIS — K760 Fatty (change of) liver, not elsewhere classified: Secondary | ICD-10-CM | POA: Diagnosis not present

## 2018-08-24 DIAGNOSIS — Z6836 Body mass index (BMI) 36.0-36.9, adult: Secondary | ICD-10-CM | POA: Diagnosis not present

## 2018-08-24 DIAGNOSIS — Z471 Aftercare following joint replacement surgery: Secondary | ICD-10-CM | POA: Diagnosis not present

## 2018-08-24 DIAGNOSIS — E785 Hyperlipidemia, unspecified: Secondary | ICD-10-CM | POA: Diagnosis not present

## 2018-08-24 DIAGNOSIS — Z96641 Presence of right artificial hip joint: Secondary | ICD-10-CM | POA: Diagnosis not present

## 2018-08-24 DIAGNOSIS — Z89421 Acquired absence of other right toe(s): Secondary | ICD-10-CM | POA: Diagnosis not present

## 2018-08-24 DIAGNOSIS — G629 Polyneuropathy, unspecified: Secondary | ICD-10-CM | POA: Diagnosis not present

## 2018-08-24 DIAGNOSIS — Z96653 Presence of artificial knee joint, bilateral: Secondary | ICD-10-CM | POA: Diagnosis not present

## 2018-08-24 DIAGNOSIS — Z87891 Personal history of nicotine dependence: Secondary | ICD-10-CM | POA: Diagnosis not present

## 2018-08-24 DIAGNOSIS — F1210 Cannabis abuse, uncomplicated: Secondary | ICD-10-CM | POA: Diagnosis not present

## 2018-08-24 DIAGNOSIS — I10 Essential (primary) hypertension: Secondary | ICD-10-CM | POA: Diagnosis not present

## 2018-08-24 DIAGNOSIS — K760 Fatty (change of) liver, not elsewhere classified: Secondary | ICD-10-CM | POA: Diagnosis not present

## 2018-08-25 DIAGNOSIS — Z87891 Personal history of nicotine dependence: Secondary | ICD-10-CM | POA: Diagnosis not present

## 2018-08-25 DIAGNOSIS — F1210 Cannabis abuse, uncomplicated: Secondary | ICD-10-CM | POA: Diagnosis not present

## 2018-08-25 DIAGNOSIS — E785 Hyperlipidemia, unspecified: Secondary | ICD-10-CM | POA: Diagnosis not present

## 2018-08-25 DIAGNOSIS — Z96641 Presence of right artificial hip joint: Secondary | ICD-10-CM | POA: Diagnosis not present

## 2018-08-25 DIAGNOSIS — Z471 Aftercare following joint replacement surgery: Secondary | ICD-10-CM | POA: Diagnosis not present

## 2018-08-25 DIAGNOSIS — K760 Fatty (change of) liver, not elsewhere classified: Secondary | ICD-10-CM | POA: Diagnosis not present

## 2018-08-25 DIAGNOSIS — G629 Polyneuropathy, unspecified: Secondary | ICD-10-CM | POA: Diagnosis not present

## 2018-08-25 DIAGNOSIS — I10 Essential (primary) hypertension: Secondary | ICD-10-CM | POA: Diagnosis not present

## 2018-08-25 DIAGNOSIS — Z96653 Presence of artificial knee joint, bilateral: Secondary | ICD-10-CM | POA: Diagnosis not present

## 2018-08-25 DIAGNOSIS — Z6836 Body mass index (BMI) 36.0-36.9, adult: Secondary | ICD-10-CM | POA: Diagnosis not present

## 2018-08-25 DIAGNOSIS — Z89421 Acquired absence of other right toe(s): Secondary | ICD-10-CM | POA: Diagnosis not present

## 2018-08-30 DIAGNOSIS — K760 Fatty (change of) liver, not elsewhere classified: Secondary | ICD-10-CM | POA: Diagnosis not present

## 2018-08-30 DIAGNOSIS — Z87891 Personal history of nicotine dependence: Secondary | ICD-10-CM | POA: Diagnosis not present

## 2018-08-30 DIAGNOSIS — I10 Essential (primary) hypertension: Secondary | ICD-10-CM | POA: Diagnosis not present

## 2018-08-30 DIAGNOSIS — G629 Polyneuropathy, unspecified: Secondary | ICD-10-CM | POA: Diagnosis not present

## 2018-08-30 DIAGNOSIS — Z471 Aftercare following joint replacement surgery: Secondary | ICD-10-CM | POA: Diagnosis not present

## 2018-08-30 DIAGNOSIS — Z6836 Body mass index (BMI) 36.0-36.9, adult: Secondary | ICD-10-CM | POA: Diagnosis not present

## 2018-08-30 DIAGNOSIS — Z89421 Acquired absence of other right toe(s): Secondary | ICD-10-CM | POA: Diagnosis not present

## 2018-08-30 DIAGNOSIS — E785 Hyperlipidemia, unspecified: Secondary | ICD-10-CM | POA: Diagnosis not present

## 2018-08-30 DIAGNOSIS — Z96653 Presence of artificial knee joint, bilateral: Secondary | ICD-10-CM | POA: Diagnosis not present

## 2018-08-30 DIAGNOSIS — Z96641 Presence of right artificial hip joint: Secondary | ICD-10-CM | POA: Diagnosis not present

## 2018-08-30 DIAGNOSIS — F1210 Cannabis abuse, uncomplicated: Secondary | ICD-10-CM | POA: Diagnosis not present

## 2018-09-01 ENCOUNTER — Ambulatory Visit (HOSPITAL_COMMUNITY): Payer: BLUE CROSS/BLUE SHIELD

## 2018-09-01 ENCOUNTER — Encounter (HOSPITAL_COMMUNITY): Payer: Self-pay | Admitting: *Deleted

## 2018-09-01 ENCOUNTER — Ambulatory Visit (HOSPITAL_COMMUNITY): Payer: BLUE CROSS/BLUE SHIELD | Admitting: Anesthesiology

## 2018-09-01 ENCOUNTER — Encounter (HOSPITAL_COMMUNITY)
Admission: RE | Disposition: A | Discharge status: Home / Self Care | Payer: Self-pay | Source: Other Acute Inpatient Hospital | Attending: Orthopedic Surgery

## 2018-09-01 ENCOUNTER — Other Ambulatory Visit: Payer: Self-pay

## 2018-09-01 ENCOUNTER — Ambulatory Visit (HOSPITAL_COMMUNITY)
Admission: RE | Admit: 2018-09-01 | Discharge: 2018-09-01 | Disposition: A | Discharge status: Home / Self Care | Payer: BLUE CROSS/BLUE SHIELD | Source: Other Acute Inpatient Hospital | Attending: Orthopedic Surgery | Admitting: Orthopedic Surgery

## 2018-09-01 DIAGNOSIS — Z96653 Presence of artificial knee joint, bilateral: Secondary | ICD-10-CM | POA: Diagnosis not present

## 2018-09-01 DIAGNOSIS — Z7984 Long term (current) use of oral hypoglycemic drugs: Secondary | ICD-10-CM | POA: Diagnosis not present

## 2018-09-01 DIAGNOSIS — G5603 Carpal tunnel syndrome, bilateral upper limbs: Secondary | ICD-10-CM | POA: Diagnosis not present

## 2018-09-01 DIAGNOSIS — Z79899 Other long term (current) drug therapy: Secondary | ICD-10-CM | POA: Diagnosis not present

## 2018-09-01 DIAGNOSIS — Z89422 Acquired absence of other left toe(s): Secondary | ICD-10-CM | POA: Diagnosis not present

## 2018-09-01 DIAGNOSIS — T84020A Dislocation of internal right hip prosthesis, initial encounter: Secondary | ICD-10-CM | POA: Insufficient documentation

## 2018-09-01 DIAGNOSIS — M87051 Idiopathic aseptic necrosis of right femur: Secondary | ICD-10-CM | POA: Diagnosis not present

## 2018-09-01 DIAGNOSIS — K219 Gastro-esophageal reflux disease without esophagitis: Secondary | ICD-10-CM | POA: Insufficient documentation

## 2018-09-01 DIAGNOSIS — Z96641 Presence of right artificial hip joint: Secondary | ICD-10-CM | POA: Diagnosis not present

## 2018-09-01 DIAGNOSIS — K760 Fatty (change of) liver, not elsewhere classified: Secondary | ICD-10-CM | POA: Insufficient documentation

## 2018-09-01 DIAGNOSIS — Z419 Encounter for procedure for purposes other than remedying health state, unspecified: Secondary | ICD-10-CM

## 2018-09-01 DIAGNOSIS — Z9049 Acquired absence of other specified parts of digestive tract: Secondary | ICD-10-CM | POA: Diagnosis not present

## 2018-09-01 DIAGNOSIS — E785 Hyperlipidemia, unspecified: Secondary | ICD-10-CM | POA: Diagnosis not present

## 2018-09-01 DIAGNOSIS — M1990 Unspecified osteoarthritis, unspecified site: Secondary | ICD-10-CM | POA: Insufficient documentation

## 2018-09-01 DIAGNOSIS — Z7982 Long term (current) use of aspirin: Secondary | ICD-10-CM | POA: Insufficient documentation

## 2018-09-01 DIAGNOSIS — E1140 Type 2 diabetes mellitus with diabetic neuropathy, unspecified: Secondary | ICD-10-CM | POA: Diagnosis not present

## 2018-09-01 DIAGNOSIS — I10 Essential (primary) hypertension: Secondary | ICD-10-CM | POA: Diagnosis not present

## 2018-09-01 DIAGNOSIS — G4733 Obstructive sleep apnea (adult) (pediatric): Secondary | ICD-10-CM | POA: Insufficient documentation

## 2018-09-01 DIAGNOSIS — D649 Anemia, unspecified: Secondary | ICD-10-CM | POA: Insufficient documentation

## 2018-09-01 DIAGNOSIS — Z87891 Personal history of nicotine dependence: Secondary | ICD-10-CM | POA: Insufficient documentation

## 2018-09-01 DIAGNOSIS — X58XXXA Exposure to other specified factors, initial encounter: Secondary | ICD-10-CM | POA: Diagnosis not present

## 2018-09-01 DIAGNOSIS — Z471 Aftercare following joint replacement surgery: Secondary | ICD-10-CM | POA: Diagnosis not present

## 2018-09-01 HISTORY — DX: Type 2 diabetes mellitus without complications: E11.9

## 2018-09-01 HISTORY — PX: HIP CLOSED REDUCTION: SHX983

## 2018-09-01 LAB — CBC
HCT: 29 % — ABNORMAL LOW (ref 39.0–52.0)
Hemoglobin: 9.2 g/dL — ABNORMAL LOW (ref 13.0–17.0)
MCH: 33.1 pg (ref 26.0–34.0)
MCHC: 31.7 g/dL (ref 30.0–36.0)
MCV: 104.3 fL — AB (ref 80.0–100.0)
Platelets: 321 10*3/uL (ref 150–400)
RBC: 2.78 MIL/uL — ABNORMAL LOW (ref 4.22–5.81)
RDW: 14.9 % (ref 11.5–15.5)
WBC: 5 10*3/uL (ref 4.0–10.5)
nRBC: 0 % (ref 0.0–0.2)

## 2018-09-01 LAB — BASIC METABOLIC PANEL
Anion gap: 11 (ref 5–15)
BUN: 9 mg/dL (ref 6–20)
CO2: 27 mmol/L (ref 22–32)
Calcium: 9 mg/dL (ref 8.9–10.3)
Chloride: 99 mmol/L (ref 98–111)
Creatinine, Ser: 0.92 mg/dL (ref 0.61–1.24)
GFR calc Af Amer: >60 mL/min (ref >60)
Glucose, Bld: 146 mg/dL — ABNORMAL HIGH (ref 70–99)
POTASSIUM: 3.5 mmol/L (ref 3.5–5.1)
Sodium: 137 mmol/L (ref 135–145)

## 2018-09-01 LAB — GLUCOSE, CAPILLARY
GLUCOSE-CAPILLARY: 147 mg/dL — AB (ref 70–99)
Glucose-Capillary: 120 mg/dL — ABNORMAL HIGH (ref 70–99)

## 2018-09-01 SURGERY — CLOSED REDUCTION, HIP
Anesthesia: General | Site: Hip | Laterality: Right

## 2018-09-01 MED ORDER — ACETAMINOPHEN 10 MG/ML IV SOLN: 1000.0000 mg | Freq: Once | INTRAVENOUS | Status: DC | PRN

## 2018-09-01 MED ORDER — POVIDONE-IODINE 10 % EX SWAB
2.0000 "application " | Freq: Once | CUTANEOUS | Status: AC
  Administered 2018-09-01: 2 via TOPICAL

## 2018-09-01 MED ORDER — MIDAZOLAM HCL 2 MG/2ML IJ SOLN
INTRAMUSCULAR | Status: AC
  Filled 2018-09-01: qty 2

## 2018-09-01 MED ORDER — LACTATED RINGERS IV SOLN
INTRAVENOUS | Status: DC
  Administered 2018-09-01: 11:00:00 via INTRAVENOUS

## 2018-09-01 MED ORDER — FENTANYL CITRATE (PF) 100 MCG/2ML IJ SOLN
INTRAMUSCULAR | Status: AC
  Filled 2018-09-01: qty 2

## 2018-09-01 MED ORDER — PROPOFOL 10 MG/ML IV BOLUS
INTRAVENOUS | Status: AC
  Filled 2018-09-01: qty 20

## 2018-09-01 MED ORDER — FENTANYL CITRATE (PF) 100 MCG/2ML IJ SOLN
50.0000 ug | INTRAMUSCULAR | Status: DC | PRN
  Administered 2018-09-01: 50 ug via INTRAVENOUS
  Administered 2018-09-01: 100 ug via INTRAVENOUS
  Filled 2018-09-01: qty 2

## 2018-09-01 MED ORDER — HYDROCODONE-ACETAMINOPHEN 7.5-325 MG PO TABS: 1.0000 | ORAL_TABLET | Freq: Once | ORAL | Status: DC | PRN

## 2018-09-01 MED ORDER — LIDOCAINE 2% (20 MG/ML) 5 ML SYRINGE
INTRAMUSCULAR | Status: AC
  Filled 2018-09-01: qty 5

## 2018-09-01 MED ORDER — MIDAZOLAM HCL 5 MG/5ML IJ SOLN
INTRAMUSCULAR | Status: DC | PRN
  Administered 2018-09-01: 2 mg via INTRAVENOUS

## 2018-09-01 MED ORDER — ONDANSETRON HCL 4 MG/2ML IJ SOLN
INTRAMUSCULAR | Status: AC
  Filled 2018-09-01: qty 2

## 2018-09-01 MED ORDER — LIDOCAINE 2% (20 MG/ML) 5 ML SYRINGE
INTRAMUSCULAR | Status: DC | PRN
  Administered 2018-09-01: 100 mg via INTRAVENOUS

## 2018-09-01 MED ORDER — MEPERIDINE HCL 50 MG/ML IJ SOLN: 6.2500 mg | INTRAMUSCULAR | Status: DC | PRN

## 2018-09-01 MED ORDER — ONDANSETRON HCL 4 MG/2ML IJ SOLN
INTRAMUSCULAR | Status: DC | PRN
  Administered 2018-09-01: 4 mg via INTRAVENOUS

## 2018-09-01 MED ORDER — PROMETHAZINE HCL 25 MG/ML IJ SOLN: 6.2500 mg | INTRAMUSCULAR | Status: DC | PRN

## 2018-09-01 MED ORDER — SUCCINYLCHOLINE CHLORIDE 20 MG/ML IJ SOLN
INTRAMUSCULAR | Status: DC | PRN
  Administered 2018-09-01 (×2): 100 mg via INTRAVENOUS

## 2018-09-01 MED ORDER — LACTATED RINGERS IV SOLN
INTRAVENOUS | Status: DC
  Administered 2018-09-01: 10000 mL via INTRAVENOUS

## 2018-09-01 MED ORDER — HYDROMORPHONE HCL 1 MG/ML IJ SOLN: 0.2500 mg | INTRAMUSCULAR | Status: DC | PRN

## 2018-09-01 MED ORDER — FENTANYL CITRATE (PF) 100 MCG/2ML IJ SOLN
INTRAMUSCULAR | Status: DC | PRN
  Administered 2018-09-01 (×3): 50 ug via INTRAVENOUS

## 2018-09-01 MED ORDER — PROPOFOL 10 MG/ML IV BOLUS
INTRAVENOUS | Status: DC | PRN
  Administered 2018-09-01: 200 mg via INTRAVENOUS

## 2018-09-01 MED ORDER — CHLORHEXIDINE GLUCONATE 4 % EX LIQD: 60.0000 mL | Freq: Once | CUTANEOUS | Status: DC

## 2018-09-01 SURGICAL SUPPLY — 2 items
PAD ABD 8X10 STRL (GAUZE/BANDAGES/DRESSINGS) ×2 IMPLANT
TAPE CLOTH SURG 6X10 WHT LF (GAUZE/BANDAGES/DRESSINGS) ×2 IMPLANT

## 2018-09-01 NOTE — H&P (Signed)
ORTHOPAEDIC H&P  REQUESTING PHYSICIAN: Yolonda Kida, MD  PCP:  [name], MD  Chief Complaint: Right hip pain  HPI: [name] is a 57 y.o. male who complains of right hip pain and inability to bear weight following acute onset of that pain 4 days prior to today.  He states that he noted that the right leg was also externally rotated.  He presented to my office today to see his surgeon Dr. Linna Caprice.  They noted on radiographs that he did have an anterior dislocation of his total hip arthroplasty.  He is now 2 weeks status post that surgery.  He denies any fevers, night sweats or chills.  Does endorse a little bit of paresthesia in the right leg but does have peripheral neuropathy at baseline.  Past Medical History:  Diagnosis Date  . Alcohol dependence (HCC)   . Arthritis    hips, hands  . Bilateral carpal tunnel syndrome   . Bilateral leg edema    Chronic  . Diabetes mellitus without complication (HCC)   . Diverticulitis    portion of colon removed  . DOE (dyspnea on exertion)    occ  . Elevated liver enzymes   . Exstrophy of bladder 10/2013   pt unaware of this diagnosis  . Fatty liver   . GERD (gastroesophageal reflux disease)    occ  . Hammer toe   . Hip pain   . History of ventral hernia repair 2016   x2  . Hyperlipidemia    pt unsure  . Hypertension   . Marijuana abuse   . Morbid obesity (HCC)   . Neuromuscular disorder (HCC)    peripheral neuropathy  . OSA (obstructive sleep apnea)    has OSA-not used CPAP 2-3 yrs  . PONV (postoperative nausea and vomiting)   . Pre-diabetes   . Toe ulcer (HCC)    left   Past Surgical History:  Procedure Laterality Date  . AMPUTATION TOE Left 05/25/2018   Procedure: AMPUTATION TOE left 3rd;  Surgeon: Toni Arthurs, MD;  Location: [facility];  Service: Orthopedics;  Laterality: Left;  , to follow  . AMPUTATION TOE Left 06/28/2018   Procedure: Left foot revision 3rd toe amputation including  3rd metatarsal;  Surgeon: Toni Arthurs, MD;  Location: [facility];  Service: Orthopedics;  Laterality: Left;   . BICEPS TENDON REPAIR Left 2014   Partial  . COLONOSCOPY    . HERNIA REPAIR  2016   ventral  . JOINT REPLACEMENT     b/l knees   . TOE AMPUTATION Right 09/2013  . TOTAL HIP ARTHROPLASTY Right 08/16/2018   Procedure: TOTAL HIP ARTHROPLASTY ANTERIOR APPROACH;  Surgeon: Samson Frederic, MD;  Location: [facility];  Service: Orthopedics;  Laterality: Right;  . TOTAL KNEE ARTHROPLASTY     bilat   Social History   Socioeconomic History  . Marital status: Divorced    Spouse name: Not on file  . Number of children: Not on file  . Years of education: Not on file  . Highest education level: Not on file  Occupational History  . Occupation: unemployed, Press photographer for disability  Social Needs  . Financial resource strain: Not on file  . Food insecurity:    Worry: Not on file    Inability: Not on file  . Transportation needs:    Medical: Not on file    Non-medical: Not on file  Tobacco Use  . Smoking status: Former Games developer  .  Smokeless tobacco: Never Used  Substance and Sexual Activity  . Alcohol use: Yes    Comment: 2 liquor drinks /night  . Drug use: Yes    Frequency: 3.0 times per week    Types: Marijuana    Comment: couple times per week  . Sexual activity: Not on file  Lifestyle  . Physical activity:    Days per week: Not on file    Minutes per session: Not on file  . Stress: Not on file  Relationships  . Social connections:    Talks on phone: Not on file    Gets together: Not on file    Attends religious service: Not on file    Active member of club or organization: Not on file    Attends meetings of clubs or organizations: Not on file    Relationship status: Not on file  Other Topics Concern  . Not on file  Social History Narrative  . Not on file   Family History  Adopted: Yes  Family history unknown: Yes   Allergies  Allergen Reactions    . Claritin [Loratadine] Shortness Of Breath   Prior to Admission medications   Medication Sig Start Date End Date Taking? Authorizing Provider  aspirin 81 MG chewable tablet Chew 1 tablet (81 mg total) by mouth 2 (two) times daily. 08/17/18  Yes Swinteck, Arlys John, MD  calcium carbonate (TUMS - DOSED IN MG ELEMENTAL CALCIUM) 500 MG chewable tablet Chew 2 tablets by mouth daily as needed for indigestion or heartburn.   Yes [provider]  diphenhydrAMINE (BENADRYL) 25 MG tablet Take 25 mg by mouth at bedtime as needed for sleep.   Yes [provider]  Docusate Sodium (STOOL SOFTENER) 100 MG capsule Take 100 mg by mouth 2 (two) times daily as needed for constipation.   Yes [provider]  furosemide (LASIX) 20 MG tablet Take 2 tablets (40 mg total) by mouth daily. 07/10/18  Yes Burns, Bobette Mo, MD  HYDROcodone-acetaminophen (NORCO/VICODIN) 5-325 MG tablet Take 1-2 tablets by mouth every 6 (six) hours as needed for moderate pain. 08/17/18  Yes Swinteck, Arlys John, MD  losartan (COZAAR) 50 MG tablet Take 1 tablet (50 mg total) by mouth daily. 06/30/18  Yes Burns, Bobette Mo, MD  metFORMIN (GLUCOPHAGE) 500 MG tablet Take 1 tablet (500 mg total) by mouth 2 (two) times daily with a meal. 06/30/18  Yes Burns, Bobette Mo, MD  metoprolol tartrate (LOPRESSOR) 100 MG tablet TAKE 1 TABLET (100 MG TOTAL) BY MOUTH 2 (TWO) TIMES DAILY. 03/20/18  Yes Burns, Bobette Mo, MD  OVER THE COUNTER MEDICATION Apply 1 application topically daily. Hemp Extract Cream   Yes [provider]  DULoxetine (CYMBALTA) 20 MG capsule TAKE ONE CAPSULE BY MOUTH EVERY DAY Patient not taking: No sig reported 06/26/18   Tarry Kos, MD  folic acid (FOLVITE) 1 MG tablet Take 1 tablet (1 mg total) by mouth daily. Patient not taking: Reported on 08/07/2018 02/20/18   Edsel Petrin, DO  Multiple Vitamin (MULTIVITAMIN WITH MINERALS) TABS tablet Take 1 tablet by mouth daily. Patient not taking: Reported on 08/07/2018 02/20/18    Edsel Petrin, DO  ondansetron (ZOFRAN) 4 MG tablet Take 1 tablet (4 mg total) by mouth every 6 (six) hours as needed for nausea. Patient not taking: Reported on 09/01/2018 08/17/18   Samson Frederic, MD  saccharomyces boulardii (FLORASTOR) 250 MG capsule Take 1 capsule (250 mg total) by mouth 2 (two) times daily. Patient not taking: Reported on 08/07/2018  02/20/18   Mikhail, Nita SellsMaryann, DO  senna (SENOKOT) 8.6 MG TABS tablet Take 1 tablet (8.6 mg total) by mouth 2 (two) times daily. Patient not taking: Reported on 09/01/2018 08/17/18   Samson FredericSwinteck, Brian, MD  thiamine 100 MG tablet Take 1 tablet (100 mg total) by mouth daily. Patient not taking: Reported on 08/07/2018 02/20/18   Edsel PetrinMikhail, Maryann, DO   No results found.  Positive ROS: All other systems have been reviewed and were otherwise negative with the exception of those mentioned in the HPI and as above.  Physical Exam: General: Alert, no acute distress Cardiovascular: No pedal edema Respiratory: No cyanosis, no use of accessory musculature GI: No organomegaly, abdomen is soft and non-tender Skin: No lesions in the area of chief complaint Neurologic: Sensation intact distally Psychiatric: Patient is competent for consent with normal mood and affect Lymphatic: No axillary or cervical lymphadenopathy    Assessment: Right total hip arthroplasty dislocation, closed.  Plan: -Plan for closed reduction in the operating theater with general anesthetic and muscle relaxation. -We discussed the risk, benefits, and indications this procedure he has provided informed consent to proceed.    Yolonda KidaJason Patrick , MD Cell [phone]    09/01/2018 1:32 PM

## 2018-09-01 NOTE — Op Note (Signed)
Date of Surgery: 09/01/2018  INDICATIONS: [name] is a 57 y.o.-year-old male with a right closed dislocation of total hip arthroplasty.;  The patient did consent to the procedure after discussion of the risks and benefits.  PREOPERATIVE DIAGNOSIS: Right total hip arthroplasty dislocation  POSTOPERATIVE DIAGNOSIS: Same.  PROCEDURE: Closed reduction of right total hip with general anesthesia  SURGEON: Maryan RuedJason P , M.D.  ASSIST: None.  ANESTHESIA:  general  IV FLUIDS AND URINE: See anesthesia.  ESTIMATED BLOOD LOSS: 0 mL.  IMPLANTS: None  DRAINS: None  COMPLICATIONS: None.  DESCRIPTION OF PROCEDURE: The patient was brought to the operating room and placed supine on the operating table.  The patient had been signed prior to the procedure and this was documented. The patient had the anesthesia placed by the anesthesiologist.  A time-out was performed to confirm that this was the correct patient, site, side and location.  Preoperative antibiotics were not given given the closed procedure.  Likewise no sterile prep and drape was performed.  Once the patient had general anesthesia placed by the anesthesiologist we began a closed reduction maneuver.  Utilizing in-line traction as well as flexion and internal rotation the hip was able to be pulled into place with a palpable reduction clunk.  Utilizing portable x-ray we obtained an AP and lateral radiography of the right hip which was independently interpreted by myself.  This confirmed concentric reduction of the right total hip arthroplasty.  We did range the hip with full flexion extension internal rotation and external rotation with no instability noted.  Patient maintained palpable pulses throughout the procedure.  Patient was awakened from his anesthetic with no complications.  There were no procedural complications.  POSTOPERATIVE PLAN:  [name] will be discharged home from PACU.  He will be weightbearing as tolerated with hip  precautions reiterated.  He will follow-up with Dr. Linna CapriceSwinteck in 3 to 4 weeks with new x-rays.  1

## 2018-09-01 NOTE — Brief Op Note (Signed)
09/01/2018  2:18 PM  PATIENT:  [name]  57 y.o. male  PRE-OPERATIVE DIAGNOSIS:  Right total hip dislocation  POST-OPERATIVE DIAGNOSIS:  Right total hip dislocation  PROCEDURE:  Procedure(s): CLOSED REDUCTION HIP (Right)  SURGEON:  Surgeon(s) and Role:    * Yolonda Kidaogers, Jason Patrick, MD - Primary    * Kathryne HitchBlackman, Christopher Y, MD - Assisting  PHYSICIAN ASSISTANT:   ASSISTANTS: none   ANESTHESIA:   general  EBL:  0  BLOOD ADMINISTERED:none  DRAINS: none   LOCAL MEDICATIONS USED:  NONE  SPECIMEN:  No Specimen  DISPOSITION OF SPECIMEN:  N/A  COUNTS:  YES  TOURNIQUET:  * No tourniquets in log *  DICTATION: .Note written in EPIC  PLAN OF CARE: Discharge to home after PACU  PATIENT DISPOSITION:  PACU - hemodynamically stable.   Delay start of Pharmacological VTE agent (>24hrs) due to surgical blood loss or risk of bleeding: not applicable

## 2018-09-01 NOTE — Transfer of Care (Signed)
Immediate Anesthesia Transfer of Care Note  Patient: [name]  Procedure(s) Performed: CLOSED REDUCTION HIP (Right Hip)  Patient Location: PACU  Anesthesia Type:General  Level of Consciousness: awake, alert  and oriented  Airway & Oxygen Therapy: Patient Spontanous Breathing and Patient connected to face mask oxygen  Post-op Assessment: Report given to RN and Post -op Vital signs reviewed and stable  Post vital signs: Reviewed and stable  Last Vitals:  Vitals Value Taken Time  BP 146/89 09/01/2018  2:15 PM  Temp    Pulse 84 09/01/2018  2:17 PM  Resp 19 09/01/2018  2:17 PM  SpO2 100 % 09/01/2018  2:17 PM  Vitals shown include unvalidated device data.  Last Pain:  Vitals:   09/01/18 1321  TempSrc:   PainSc: 5       Patients Stated Pain Goal: 4 (09/01/18 1321)  Complications: No apparent anesthesia complications

## 2018-09-01 NOTE — Anesthesia Procedure Notes (Signed)
Procedure Name: LMA Insertion Date/Time: 09/01/2018 1:44 PM Performed by: Orest DikesPeters, Augustina Braddock J, CRNA Pre-anesthesia Checklist: Patient identified, Emergency Drugs available, Suction available and Patient being monitored Patient Re-evaluated:Patient Re-evaluated prior to induction Oxygen Delivery Method: Circle system utilized Preoxygenation: Pre-oxygenation with 100% oxygen Induction Type: IV induction Ventilation: Oral airway inserted - appropriate to patient size LMA: LMA inserted LMA Size: 4.0 Number of attempts: 1 Placement Confirmation: positive ETCO2 and breath sounds checked- equal and bilateral Tube secured with: Tape Dental Injury: Teeth and Oropharynx as per pre-operative assessment

## 2018-09-01 NOTE — Anesthesia Preprocedure Evaluation (Addendum)
Anesthesia Evaluation  Patient identified by MRN, date of birth, ID band Patient awake    Reviewed: Allergy & Precautions, NPO status , Patient's Chart, lab work & pertinent test results  History of Anesthesia Complications (+) PONV  Airway Mallampati: II  TM Distance: >3 FB Neck ROM: Full    Dental no notable dental hx. (+) Teeth Intact, Dental Advisory Given   Pulmonary sleep apnea , former smoker,    Pulmonary exam normal breath sounds clear to auscultation       Cardiovascular hypertension, Pt. on medications and Pt. on home beta blockers + DOE  Normal cardiovascular exam Rhythm:Regular Rate:Normal  05/25/18 EKG- SR   Neuro/Psych  Neuromuscular disease    GI/Hepatic GERD  ,(+) Cirrhosis       ,   Endo/Other  diabetes, Type 2  Renal/GU      Musculoskeletal  (+) Arthritis , AVN   Abdominal (+) + obese,   Peds  Hematology  (+) anemia ,   Anesthesia Other Findings   Reproductive/Obstetrics                            Lab Results  Component Value Date   WBC 8.9 08/18/2018   HGB 7.6 (L) 08/18/2018   HCT 23.7 (L) 08/18/2018   MCV 104.9 (H) 08/18/2018   PLT 214 08/18/2018    Anesthesia Physical Anesthesia Plan  ASA: IV  Anesthesia Plan: General   Post-op Pain Management:    Induction: Intravenous  PONV Risk Score and Plan: 3 and TIVA, Ondansetron, Dexamethasone, Treatment may vary due to age or medical condition and Scopolamine patch - Pre-op  Airway Management Planned: Mask and LMA  Additional Equipment:   Intra-op Plan:   Post-operative Plan:   Informed Consent: I have reviewed the patients History and Physical, chart, labs and discussed the procedure including the risks, benefits and alternatives for the proposed anesthesia with the patient or authorized representative who has indicated his/her understanding and acceptance.   Dental advisory given  Plan Discussed  with: CRNA  Anesthesia Plan Comments:         Anesthesia Quick Evaluation

## 2018-09-01 NOTE — Discharge Instructions (Signed)
General Anesthesia, Adult, Care After °These instructions provide you with information about caring for yourself after your procedure. Your health care provider may also give you more specific instructions. Your treatment has been planned according to current medical practices, but problems sometimes occur. Call your health care provider if you have any problems or questions after your procedure. °What can I expect after the procedure? °After the procedure, it is common to have: °· Vomiting. °· A sore throat. °· Mental slowness. ° °It is common to feel: °· Nauseous. °· Cold or shivery. °· Sleepy. °· Tired. °· Sore or achy, even in parts of your body where you did not have surgery. ° °Follow these instructions at home: °For at least 24 hours after the procedure: °· Do not: °? Participate in activities where you could fall or become injured. °? Drive. °? Use heavy machinery. °? Drink alcohol. °? Take sleeping pills or medicines that cause drowsiness. °? Make important decisions or sign legal documents. °? Take care of children on your own. °· Rest. °Eating and drinking °· If you vomit, drink water, juice, or soup when you can drink without vomiting. °· Drink enough fluid to keep your urine clear or pale yellow. °· Make sure you have little or no nausea before eating solid foods. °· Follow the diet recommended by your health care provider. °General instructions °· Have a responsible adult stay with you until you are awake and alert. °· Return to your normal activities as told by your health care provider. Ask your health care provider what activities are safe for you. °· Take over-the-counter and prescription medicines only as told by your health care provider. °· If you smoke, do not smoke without supervision. °· Keep all follow-up visits as told by your health care provider. This is important. °Contact a health care provider if: °· You continue to have nausea or vomiting at home, and medicines are not helpful. °· You  cannot drink fluids or start eating again. °· You cannot urinate after 8-12 hours. °· You develop a skin rash. °· You have fever. °· You have increasing redness at the site of your procedure. °Get help right away if: °· You have difficulty breathing. °· You have chest pain. °· You have unexpected bleeding. °· You feel that you are having a life-threatening or urgent problem. °This information is not intended to replace advice given to you by your health care provider. Make sure you discuss any questions you have with your health care provider. °Document Released: 01/10/2001 Document Revised: 03/08/2016 Document Reviewed: 09/18/2015 °Elsevier Interactive Patient Education © 2018 [employer] ° °

## 2018-09-04 ENCOUNTER — Encounter (HOSPITAL_COMMUNITY): Payer: Self-pay | Admitting: Orthopedic Surgery

## 2018-09-04 NOTE — Anesthesia Postprocedure Evaluation (Signed)
Anesthesia Post Note  Patient: [name]  Procedure(s) Performed: CLOSED REDUCTION HIP (Right Hip)     Patient location during evaluation: PACU Anesthesia Type: General Level of consciousness: awake and alert Pain management: pain level controlled Vital Signs Assessment: post-procedure vital signs reviewed and stable Respiratory status: spontaneous breathing, nonlabored ventilation, respiratory function stable and patient connected to nasal cannula oxygen Cardiovascular status: blood pressure returned to baseline and stable Postop Assessment: no apparent nausea or vomiting Anesthetic complications: no    Last Vitals:  Vitals:   09/01/18 1506 09/01/18 1520  BP: 140/79 131/89  Pulse: 93 96  Resp: 18 16  Temp: 36.9 C 36.7 C  SpO2: 98% 99%    Last Pain:  Vitals:   09/01/18 1520  TempSrc:   PainSc: 0-No pain                 Trevor IhaStephen A Kyrra Prada

## 2018-09-08 DIAGNOSIS — Z471 Aftercare following joint replacement surgery: Secondary | ICD-10-CM | POA: Diagnosis not present

## 2018-09-08 DIAGNOSIS — Z96641 Presence of right artificial hip joint: Secondary | ICD-10-CM | POA: Diagnosis not present

## 2018-10-02 ENCOUNTER — Encounter (HOSPITAL_BASED_OUTPATIENT_CLINIC_OR_DEPARTMENT_OTHER): Payer: BLUE CROSS/BLUE SHIELD | Attending: Internal Medicine

## 2018-10-13 ENCOUNTER — Ambulatory Visit: Payer: Self-pay | Admitting: Orthopedic Surgery

## 2018-10-17 ENCOUNTER — Ambulatory Visit: Payer: Self-pay | Admitting: Orthopedic Surgery

## 2018-10-19 ENCOUNTER — Telehealth: Payer: Self-pay

## 2018-10-19 NOTE — Telephone Encounter (Signed)
Ortho requires that pt come in to have medical clearance. Certain labs have to be updated. Please have him set up appointment before surgical clearance form can be completed.

## 2018-10-19 NOTE — Telephone Encounter (Signed)
Copied from CRM [id number]. Topic: Appointment Scheduling - Scheduling Inquiry for Clinic >> Oct 17, 2018 11:54 AM Morphies, Hermine Messick wrote: Called patient and left message for patient to call back to schedule a surgical clearance appointment so that [facility] form can be completed prior to January 8th surgery. Patient will need to be worked in. Please transfer call to the office. Form has been given to [employer] >> Oct 17, 2018  2:07 PM Crist Infante wrote: Pt states his surgery was moved to 11/02/2018. Pt had surgery 10/30 on right hip, and now having surgery on left hip.  Does not understand why he needs clearance Pt calling back at 2:08 pm and office is closed.

## 2018-10-19 NOTE — Telephone Encounter (Signed)
Called patient back and left message with below information. Patient needs to schedule an appointment with Dr Lawerance Bach so that this form can be completed.

## 2018-10-20 ENCOUNTER — Ambulatory Visit: Payer: Self-pay | Admitting: Orthopedic Surgery

## 2018-10-20 NOTE — H&P (Signed)
TOTAL HIP ADMISSION H&P  Patient is admitted for left total hip arthroplasty.  Subjective:  Chief Complaint: left hip pain  HPI: [name], 58 y.o. male, has a history of pain and functional disability in the left hip(s) due to AVN and patient has failed non-surgical conservative treatments for greater than 12 weeks to include NSAID's and/or analgesics, flexibility and strengthening excercises, use of assistive devices, weight reduction as appropriate and activity modification.  Onset of symptoms was gradual starting 5 years ago with gradually worsening course since that time.The patient noted no past surgery on the left hip(s).  Patient currently rates pain in the left hip at 10 out of 10 with activity. Patient has night pain, worsening of pain with activity and weight bearing, trendelenberg gait, pain that interfers with activities of daily living, pain with passive range of motion and crepitus. Patient has evidence of subchondral cysts, subchondral sclerosis, periarticular osteophytes, joint subluxation and joint space narrowing by imaging studies. This condition presents safety issues increasing the risk of falls. This patient has had avascular necrosis of the hip.  There is no current active infection.  Patient Active Problem List   Diagnosis Date Noted  . Avascular necrosis of hip, right (HCC) 08/16/2018  . Preoperative examination 06/30/2018  . Bilateral leg edema 02/27/2018  . Diabetic foot infection (HCC) 02/16/2018  . Alcohol dependence syndrome (HCC) 02/16/2018  . Marijuana abuse 02/16/2018  . Diabetic ulcer of right midfoot associated with type 2 diabetes mellitus, with necrosis of muscle (HCC) 11/16/2017  . Foot ulcer (HCC) 12/21/2016  . Bilateral carpal tunnel syndrome 12/15/2016  . Fatty liver 12/14/2016  . Hyperlipidemia 11/25/2016  . Elevated liver enzymes 11/25/2016  . Prediabetes 11/25/2016  . Hypertension 11/23/2016  . Neuropathy 11/23/2016  . Toe amputation status,  right 11/23/2016  . Morbid obesity (HCC) 11/23/2016  . OSA (obstructive sleep apnea) 11/23/2016  . Other hammer toe (acquired) 11/11/2013  . Exstrophy of bladder 11/11/2013   Past Medical History:  Diagnosis Date  . Alcohol dependence (HCC)   . Arthritis    hips, hands  . Bilateral carpal tunnel syndrome   . Bilateral leg edema    Chronic  . Diabetes mellitus without complication (HCC)   . Diverticulitis    portion of colon removed  . DOE (dyspnea on exertion)    occ  . Elevated liver enzymes   . Exstrophy of bladder 10/2013   pt unaware of this diagnosis  . Fatty liver   . GERD (gastroesophageal reflux disease)    occ  . Hammer toe   . Hip pain   . History of ventral hernia repair 2016   x2  . Hyperlipidemia    pt unsure  . Hypertension   . Marijuana abuse   . Morbid obesity (HCC)   . Neuromuscular disorder (HCC)    peripheral neuropathy  . OSA (obstructive sleep apnea)    has OSA-not used CPAP 2-3 yrs  . PONV (postoperative nausea and vomiting)   . Pre-diabetes   . Toe ulcer (HCC)    left    Past Surgical History:  Procedure Laterality Date  . AMPUTATION TOE Left 05/25/2018   Procedure: AMPUTATION TOE left 3rd;  Surgeon: Toni Arthurs, MD;  Location: [facility];  Service: Orthopedics;  Laterality: Left;  , to follow  . AMPUTATION TOE Left 06/28/2018   Procedure: Left foot revision 3rd toe amputation including 3rd metatarsal;  Surgeon: Toni Arthurs, MD;  Location: [facility];  Service: Orthopedics;  Laterality: Left;  60min  . BICEPS TENDON REPAIR Left 2014   Partial  . COLONOSCOPY    . HERNIA REPAIR  2016   ventral  . HIP CLOSED REDUCTION Right 09/01/2018   Procedure: CLOSED REDUCTION HIP;  Surgeon: Yolonda Kidaogers, Jason Patrick, MD;  Location: [facility];  Service: Orthopedics;  Laterality: Right;  . JOINT REPLACEMENT     b/l knees   . TOE AMPUTATION Right 09/2013  . TOTAL HIP ARTHROPLASTY Right 08/16/2018   Procedure: TOTAL HIP  ARTHROPLASTY ANTERIOR APPROACH;  Surgeon: Samson FredericSwinteck, Joshoa Shawler, MD;  Location: [facility];  Service: Orthopedics;  Laterality: Right;  . TOTAL KNEE ARTHROPLASTY     bilat    Current Outpatient Medications  Medication Sig Dispense Refill Last Dose  . aspirin 81 MG chewable tablet Chew 1 tablet (81 mg total) by mouth 2 (two) times daily. 60 tablet 1 08/31/2018 at Unknown time  . calcium carbonate (TUMS - DOSED IN MG ELEMENTAL CALCIUM) 500 MG chewable tablet Chew 2 tablets by mouth daily as needed for indigestion or heartburn.   08/31/2018 at Unknown time  . diphenhydrAMINE (BENADRYL) 25 MG tablet Take 25 mg by mouth at bedtime as needed for sleep.   08/31/2018 at Unknown time  . Docusate Sodium (STOOL SOFTENER) 100 MG capsule Take 100 mg by mouth 2 (two) times daily as needed for constipation.   Past Month at Unknown time  . DULoxetine (CYMBALTA) 20 MG capsule TAKE ONE CAPSULE BY MOUTH EVERY DAY (Patient not taking: No sig reported) 30 capsule 3 Not Taking at Unknown time  . folic acid (FOLVITE) 1 MG tablet Take 1 tablet (1 mg total) by mouth daily. (Patient not taking: Reported on 08/07/2018) 30 tablet 0 Not Taking at Unknown time  . furosemide (LASIX) 20 MG tablet Take 2 tablets (40 mg total) by mouth daily. 180 tablet 1 08/31/2018 at Unknown time  . HYDROcodone-acetaminophen (NORCO/VICODIN) 5-325 MG tablet Take 1-2 tablets by mouth every 6 (six) hours as needed for moderate pain. 50 tablet 0 09/01/2018 at 0800  . losartan (COZAAR) 50 MG tablet Take 1 tablet (50 mg total) by mouth daily. 90 tablet 3 08/31/2018 at Unknown time  . metFORMIN (GLUCOPHAGE) 500 MG tablet Take 1 tablet (500 mg total) by mouth 2 (two) times daily with a meal. 180 tablet 3 08/31/2018 at Unknown time  . metoprolol tartrate (LOPRESSOR) 100 MG tablet TAKE 1 TABLET (100 MG TOTAL) BY MOUTH 2 (TWO) TIMES DAILY. 180 tablet 3 08/31/2018 at 1900  . Multiple Vitamin (MULTIVITAMIN WITH MINERALS) TABS tablet Take 1 tablet by mouth daily.  (Patient not taking: Reported on 08/07/2018) 30 tablet 0 Not Taking at Unknown time  . ondansetron (ZOFRAN) 4 MG tablet Take 1 tablet (4 mg total) by mouth every 6 (six) hours as needed for nausea. (Patient not taking: Reported on 09/01/2018) 20 tablet 0 Not Taking at Unknown time  . OVER THE COUNTER MEDICATION Apply 1 application topically daily. Hemp Extract Cream   Past Month at Unknown time  . saccharomyces boulardii (FLORASTOR) 250 MG capsule Take 1 capsule (250 mg total) by mouth 2 (two) times daily. (Patient not taking: Reported on 08/07/2018) 30 capsule 0 Not Taking at Unknown time  . senna (SENOKOT) 8.6 MG TABS tablet Take 1 tablet (8.6 mg total) by mouth 2 (two) times daily. (Patient not taking: Reported on 09/01/2018) 120 each 0 Not Taking at Unknown time  . thiamine 100 MG tablet Take 1 tablet (100 mg total) by mouth daily. (Patient not  taking: Reported on 08/07/2018) 30 tablet 0 Not Taking at Unknown time   No current facility-administered medications for this visit.    Allergies  Allergen Reactions  . Claritin [Loratadine] Shortness Of Breath    Social History   Tobacco Use  . Smoking status: Former Games developer  . Smokeless tobacco: Never Used  Substance Use Topics  . Alcohol use: Yes    Comment: 2 liquor drinks /night    Family History  Adopted: Yes  Family history unknown: Yes     Review of Systems  Constitutional: Positive for chills.  HENT: Negative.   Eyes: Negative.   Respiratory: Negative.   Cardiovascular: Negative.   Gastrointestinal: Negative.   Genitourinary: Negative.   Musculoskeletal: Positive for joint pain.  Skin: Negative.   Neurological: Negative.   Endo/Heme/Allergies: Negative.   Psychiatric/Behavioral: Negative.     Objective:  Physical Exam  Vitals reviewed. Constitutional: He is oriented to person, place, and time. He appears well-developed and well-nourished.  HENT:  Head: Normocephalic and atraumatic.  Eyes: Pupils are equal, round,  and reactive to light. Conjunctivae and EOM are normal.  Neck: Normal range of motion. Neck supple.  Cardiovascular: Normal rate, regular rhythm and intact distal pulses.  Respiratory: Effort normal. No respiratory distress.  GI: Soft. He exhibits no distension.  Genitourinary:    Genitourinary Comments: deferred   Musculoskeletal:     Left hip: He exhibits decreased range of motion and bony tenderness.       Legs:  Neurological: He is alert and oriented to person, place, and time. He has normal reflexes.  Skin: Skin is dry.  Psychiatric: He has a normal mood and affect. His behavior is normal. Judgment and thought content normal.    Vital signs in last 24 hours: @VSRANGES @  Labs:   Estimated body mass index is 36.94 kg/m as calculated from the following:   Height as of 08/16/18: 6\' 1"  (1.854 m).   Weight as of 08/16/18: 127 kg.   Imaging Review Plain radiographs demonstrate severe degenerative joint disease of the left hip(s). The bone quality appears to be adequate for age and reported activity level.    Preoperative templating of the joint replacement has been completed, documented, and submitted to the Operating Room personnel in order to optimize intra-operative equipment management.     Assessment/Plan:  AVN, left hip(s)  The patient history, physical examination, clinical judgement of the provider and imaging studies are consistent with end stage degenerative joint disease of the left hip(s) and total hip arthroplasty is deemed medically necessary. The treatment options including medical management, injection therapy, arthroscopy and arthroplasty were discussed at length. The risks and benefits of total hip arthroplasty were presented and reviewed. The risks due to aseptic loosening, infection, stiffness, dislocation/subluxation,  thromboembolic complications and other imponderables were discussed.  The patient acknowledged the explanation, agreed to proceed with the  plan and consent was signed. Patient is being admitted for inpatient treatment for surgery, pain control, PT, OT, prophylactic antibiotics, VTE prophylaxis, progressive ambulation and ADL's and discharge planning.The patient is planning to be discharged home with HEP

## 2018-10-20 NOTE — H&P (View-Only) (Signed)
TOTAL HIP ADMISSION H&P  Patient is admitted for left total hip arthroplasty.  Subjective:  Chief Complaint: left hip pain  HPI: [name], 57 y.o. male, has a history of pain and functional disability in the left hip(s) due to AVN and patient has failed non-surgical conservative treatments for greater than 12 weeks to include NSAID's and/or analgesics, flexibility and strengthening excercises, use of assistive devices, weight reduction as appropriate and activity modification.  Onset of symptoms was gradual starting 5 years ago with gradually worsening course since that time.The patient noted no past surgery on the left hip(s).  Patient currently rates pain in the left hip at 10 out of 10 with activity. Patient has night pain, worsening of pain with activity and weight bearing, trendelenberg gait, pain that interfers with activities of daily living, pain with passive range of motion and crepitus. Patient has evidence of subchondral cysts, subchondral sclerosis, periarticular osteophytes, joint subluxation and joint space narrowing by imaging studies. This condition presents safety issues increasing the risk of falls. This patient has had avascular necrosis of the hip.  There is no current active infection.  Patient Active Problem List   Diagnosis Date Noted  . Avascular necrosis of hip, right (HCC) 08/16/2018  . Preoperative examination 06/30/2018  . Bilateral leg edema 02/27/2018  . Diabetic foot infection (HCC) 02/16/2018  . Alcohol dependence syndrome (HCC) 02/16/2018  . Marijuana abuse 02/16/2018  . Diabetic ulcer of right midfoot associated with type 2 diabetes mellitus, with necrosis of muscle (HCC) 11/16/2017  . Foot ulcer (HCC) 12/21/2016  . Bilateral carpal tunnel syndrome 12/15/2016  . Fatty liver 12/14/2016  . Hyperlipidemia 11/25/2016  . Elevated liver enzymes 11/25/2016  . Prediabetes 11/25/2016  . Hypertension 11/23/2016  . Neuropathy 11/23/2016  . Toe amputation status,  right 11/23/2016  . Morbid obesity (HCC) 11/23/2016  . OSA (obstructive sleep apnea) 11/23/2016  . Other hammer toe (acquired) 11/11/2013  . Exstrophy of bladder 11/11/2013   Past Medical History:  Diagnosis Date  . Alcohol dependence (HCC)   . Arthritis    hips, hands  . Bilateral carpal tunnel syndrome   . Bilateral leg edema    Chronic  . Diabetes mellitus without complication (HCC)   . Diverticulitis    portion of colon removed  . DOE (dyspnea on exertion)    occ  . Elevated liver enzymes   . Exstrophy of bladder 10/2013   pt unaware of this diagnosis  . Fatty liver   . GERD (gastroesophageal reflux disease)    occ  . Hammer toe   . Hip pain   . History of ventral hernia repair 2016   x2  . Hyperlipidemia    pt unsure  . Hypertension   . Marijuana abuse   . Morbid obesity (HCC)   . Neuromuscular disorder (HCC)    peripheral neuropathy  . OSA (obstructive sleep apnea)    has OSA-not used CPAP 2-3 yrs  . PONV (postoperative nausea and vomiting)   . Pre-diabetes   . Toe ulcer (HCC)    left    Past Surgical History:  Procedure Laterality Date  . AMPUTATION TOE Left 05/25/2018   Procedure: AMPUTATION TOE left 3rd;  Surgeon: Hewitt, John, MD;  Location: [facility];  Service: Orthopedics;  Laterality: Left;  60min, to follow  . AMPUTATION TOE Left 06/28/2018   Procedure: Left foot revision 3rd toe amputation including 3rd metatarsal;  Surgeon: Hewitt, John, MD;  Location: [facility];  Service: Orthopedics;    Laterality: Left;  60min  . BICEPS TENDON REPAIR Left 2014   Partial  . COLONOSCOPY    . HERNIA REPAIR  2016   ventral  . HIP CLOSED REDUCTION Right 09/01/2018   Procedure: CLOSED REDUCTION HIP;  Surgeon: Rogers, Jason Patrick, MD;  Location: [facility];  Service: Orthopedics;  Laterality: Right;  . JOINT REPLACEMENT     b/l knees   . TOE AMPUTATION Right 09/2013  . TOTAL HIP ARTHROPLASTY Right 08/16/2018   Procedure: TOTAL HIP  ARTHROPLASTY ANTERIOR APPROACH;  Surgeon: Chelli Yerkes, MD;  Location: [facility];  Service: Orthopedics;  Laterality: Right;  . TOTAL KNEE ARTHROPLASTY     bilat    Current Outpatient Medications  Medication Sig Dispense Refill Last Dose  . aspirin 81 MG chewable tablet Chew 1 tablet (81 mg total) by mouth 2 (two) times daily. 60 tablet 1 08/31/2018 at Unknown time  . calcium carbonate (TUMS - DOSED IN MG ELEMENTAL CALCIUM) 500 MG chewable tablet Chew 2 tablets by mouth daily as needed for indigestion or heartburn.   08/31/2018 at Unknown time  . diphenhydrAMINE (BENADRYL) 25 MG tablet Take 25 mg by mouth at bedtime as needed for sleep.   08/31/2018 at Unknown time  . Docusate Sodium (STOOL SOFTENER) 100 MG capsule Take 100 mg by mouth 2 (two) times daily as needed for constipation.   Past Month at Unknown time  . DULoxetine (CYMBALTA) 20 MG capsule TAKE ONE CAPSULE BY MOUTH EVERY DAY (Patient not taking: No sig reported) 30 capsule 3 Not Taking at Unknown time  . folic acid (FOLVITE) 1 MG tablet Take 1 tablet (1 mg total) by mouth daily. (Patient not taking: Reported on 08/07/2018) 30 tablet 0 Not Taking at Unknown time  . furosemide (LASIX) 20 MG tablet Take 2 tablets (40 mg total) by mouth daily. 180 tablet 1 08/31/2018 at Unknown time  . HYDROcodone-acetaminophen (NORCO/VICODIN) 5-325 MG tablet Take 1-2 tablets by mouth every 6 (six) hours as needed for moderate pain. 50 tablet 0 09/01/2018 at 0800  . losartan (COZAAR) 50 MG tablet Take 1 tablet (50 mg total) by mouth daily. 90 tablet 3 08/31/2018 at Unknown time  . metFORMIN (GLUCOPHAGE) 500 MG tablet Take 1 tablet (500 mg total) by mouth 2 (two) times daily with a meal. 180 tablet 3 08/31/2018 at Unknown time  . metoprolol tartrate (LOPRESSOR) 100 MG tablet TAKE 1 TABLET (100 MG TOTAL) BY MOUTH 2 (TWO) TIMES DAILY. 180 tablet 3 08/31/2018 at 1900  . Multiple Vitamin (MULTIVITAMIN WITH MINERALS) TABS tablet Take 1 tablet by mouth daily.  (Patient not taking: Reported on 08/07/2018) 30 tablet 0 Not Taking at Unknown time  . ondansetron (ZOFRAN) 4 MG tablet Take 1 tablet (4 mg total) by mouth every 6 (six) hours as needed for nausea. (Patient not taking: Reported on 09/01/2018) 20 tablet 0 Not Taking at Unknown time  . OVER THE COUNTER MEDICATION Apply 1 application topically daily. Hemp Extract Cream   Past Month at Unknown time  . saccharomyces boulardii (FLORASTOR) 250 MG capsule Take 1 capsule (250 mg total) by mouth 2 (two) times daily. (Patient not taking: Reported on 08/07/2018) 30 capsule 0 Not Taking at Unknown time  . senna (SENOKOT) 8.6 MG TABS tablet Take 1 tablet (8.6 mg total) by mouth 2 (two) times daily. (Patient not taking: Reported on 09/01/2018) 120 each 0 Not Taking at Unknown time  . thiamine 100 MG tablet Take 1 tablet (100 mg total) by mouth daily. (Patient not   taking: Reported on 08/07/2018) 30 tablet 0 Not Taking at Unknown time   No current facility-administered medications for this visit.    Allergies  Allergen Reactions  . Claritin [Loratadine] Shortness Of Breath    Social History   Tobacco Use  . Smoking status: Former Games developer  . Smokeless tobacco: Never Used  Substance Use Topics  . Alcohol use: Yes    Comment: 2 liquor drinks /night    Family History  Adopted: Yes  Family history unknown: Yes     Review of Systems  Constitutional: Positive for chills.  HENT: Negative.   Eyes: Negative.   Respiratory: Negative.   Cardiovascular: Negative.   Gastrointestinal: Negative.   Genitourinary: Negative.   Musculoskeletal: Positive for joint pain.  Skin: Negative.   Neurological: Negative.   Endo/Heme/Allergies: Negative.   Psychiatric/Behavioral: Negative.     Objective:  Physical Exam  Vitals reviewed. Constitutional: He is oriented to person, place, and time. He appears well-developed and well-nourished.  HENT:  Head: Normocephalic and atraumatic.  Eyes: Pupils are equal, round,  and reactive to light. Conjunctivae and EOM are normal.  Neck: Normal range of motion. Neck supple.  Cardiovascular: Normal rate, regular rhythm and intact distal pulses.  Respiratory: Effort normal. No respiratory distress.  GI: Soft. He exhibits no distension.  Genitourinary:    Genitourinary Comments: deferred   Musculoskeletal:     Left hip: He exhibits decreased range of motion and bony tenderness.       Legs:  Neurological: He is alert and oriented to person, place, and time. He has normal reflexes.  Skin: Skin is dry.  Psychiatric: He has a normal mood and affect. His behavior is normal. Judgment and thought content normal.    Vital signs in last 24 hours: @VSRANGES @  Labs:   Estimated body mass index is 36.94 kg/m as calculated from the following:   Height as of 08/16/18: 6\' 1"  (1.854 m).   Weight as of 08/16/18: 127 kg.   Imaging Review Plain radiographs demonstrate severe degenerative joint disease of the left hip(s). The bone quality appears to be adequate for age and reported activity level.    Preoperative templating of the joint replacement has been completed, documented, and submitted to the Operating Room personnel in order to optimize intra-operative equipment management.     Assessment/Plan:  AVN, left hip(s)  The patient history, physical examination, clinical judgement of the provider and imaging studies are consistent with end stage degenerative joint disease of the left hip(s) and total hip arthroplasty is deemed medically necessary. The treatment options including medical management, injection therapy, arthroscopy and arthroplasty were discussed at length. The risks and benefits of total hip arthroplasty were presented and reviewed. The risks due to aseptic loosening, infection, stiffness, dislocation/subluxation,  thromboembolic complications and other imponderables were discussed.  The patient acknowledged the explanation, agreed to proceed with the  plan and consent was signed. Patient is being admitted for inpatient treatment for surgery, pain control, PT, OT, prophylactic antibiotics, VTE prophylaxis, progressive ambulation and ADL's and discharge planning.The patient is planning to be discharged home with HEP

## 2018-10-27 ENCOUNTER — Other Ambulatory Visit: Payer: Self-pay

## 2018-10-27 ENCOUNTER — Encounter (HOSPITAL_COMMUNITY)
Admission: RE | Admit: 2018-10-27 | Discharge: 2018-10-27 | Disposition: A | Discharge status: Home / Self Care | Payer: BLUE CROSS/BLUE SHIELD | Source: Ambulatory Visit | Attending: Orthopedic Surgery | Admitting: Orthopedic Surgery

## 2018-10-27 ENCOUNTER — Encounter (HOSPITAL_COMMUNITY): Payer: Self-pay

## 2018-10-27 DIAGNOSIS — M87052 Idiopathic aseptic necrosis of left femur: Secondary | ICD-10-CM | POA: Diagnosis not present

## 2018-10-27 DIAGNOSIS — Z7984 Long term (current) use of oral hypoglycemic drugs: Secondary | ICD-10-CM | POA: Diagnosis not present

## 2018-10-27 DIAGNOSIS — Z87891 Personal history of nicotine dependence: Secondary | ICD-10-CM | POA: Diagnosis not present

## 2018-10-27 DIAGNOSIS — Z79899 Other long term (current) drug therapy: Secondary | ICD-10-CM | POA: Diagnosis not present

## 2018-10-27 DIAGNOSIS — Z01812 Encounter for preprocedural laboratory examination: Secondary | ICD-10-CM | POA: Diagnosis not present

## 2018-10-27 DIAGNOSIS — K219 Gastro-esophageal reflux disease without esophagitis: Secondary | ICD-10-CM | POA: Diagnosis not present

## 2018-10-27 DIAGNOSIS — E1142 Type 2 diabetes mellitus with diabetic polyneuropathy: Secondary | ICD-10-CM | POA: Insufficient documentation

## 2018-10-27 DIAGNOSIS — I10 Essential (primary) hypertension: Secondary | ICD-10-CM | POA: Diagnosis not present

## 2018-10-27 DIAGNOSIS — G4733 Obstructive sleep apnea (adult) (pediatric): Secondary | ICD-10-CM | POA: Diagnosis not present

## 2018-10-27 LAB — CBC
HCT: 35.8 % — ABNORMAL LOW (ref 39.0–52.0)
Hemoglobin: 11.9 g/dL — ABNORMAL LOW (ref 13.0–17.0)
MCH: 34.4 pg — ABNORMAL HIGH (ref 26.0–34.0)
MCHC: 33.2 g/dL (ref 30.0–36.0)
MCV: 103.5 fL — ABNORMAL HIGH (ref 80.0–100.0)
PLATELETS: 220 10*3/uL (ref 150–400)
RBC: 3.46 MIL/uL — ABNORMAL LOW (ref 4.22–5.81)
RDW: 14.3 % (ref 11.5–15.5)
WBC: 7.4 10*3/uL (ref 4.0–10.5)
nRBC: 0 % (ref 0.0–0.2)

## 2018-10-27 LAB — HEMOGLOBIN A1C
Hgb A1c MFr Bld: 5.6 % (ref 4.8–5.6)
Mean Plasma Glucose: 114.02 mg/dL

## 2018-10-27 LAB — SURGICAL PCR SCREEN
MRSA, PCR: NEGATIVE
Staphylococcus aureus: NEGATIVE

## 2018-10-27 LAB — COMPREHENSIVE METABOLIC PANEL
ALK PHOS: 98 U/L (ref 38–126)
ALT: 37 U/L (ref 0–44)
AST: 56 U/L — ABNORMAL HIGH (ref 15–41)
Albumin: 3.9 g/dL (ref 3.5–5.0)
Anion gap: 18 — ABNORMAL HIGH (ref 5–15)
BUN: 25 mg/dL — ABNORMAL HIGH (ref 6–20)
CO2: 28 mmol/L (ref 22–32)
CREATININE: 1.93 mg/dL — AB (ref 0.61–1.24)
Calcium: 9.4 mg/dL (ref 8.9–10.3)
Chloride: 86 mmol/L — ABNORMAL LOW (ref 98–111)
GFR calc Af Amer: 44 mL/min — ABNORMAL LOW (ref >60)
GFR calc non Af Amer: 38 mL/min — ABNORMAL LOW (ref >60)
Glucose, Bld: 126 mg/dL — ABNORMAL HIGH (ref 70–99)
Potassium: 3.3 mmol/L — ABNORMAL LOW (ref 3.5–5.1)
Sodium: 132 mmol/L — ABNORMAL LOW (ref 135–145)
TOTAL PROTEIN: 7.6 g/dL (ref 6.5–8.1)
Total Bilirubin: 1.4 mg/dL — ABNORMAL HIGH (ref 0.3–1.2)

## 2018-10-27 LAB — GLUCOSE, CAPILLARY: Glucose-Capillary: 113 mg/dL — ABNORMAL HIGH (ref 70–99)

## 2018-10-27 NOTE — Patient Instructions (Signed)
[name]  10/27/2018   Your procedure is scheduled on: 11-02-18  [facility]  Report to admitting at 530 AM    Call this number if you have problems the morning of surgery [phone]   Remember: Do not eat food or drink liquids :After Midnight. BRUSH YOUR TEETH MORNING OF SURGERY AND RINSE YOUR MOUTH OUT, NO CHEWING GUM CANDY OR MINTS.  How to Manage Your Diabetes Before and After Surgery  Why is it important to control my blood sugar before and after surgery? . Improving blood sugar levels before and after surgery helps healing and can limit problems. . A way of improving blood sugar control is eating a healthy diet by: o  Eating less sugar and carbohydrates o  Increasing activity/exercise o  Talking with your doctor about reaching your blood sugar goals . High blood sugars (greater than 180 mg/dL) can raise your risk of infections and slow your recovery, so you will need to focus on controlling your diabetes during the weeks before surgery. . Make sure that the doctor who takes care of your diabetes knows about your planned surgery including the date and location.  How do I manage my blood sugar before surgery? . Check your blood sugar at least 4 times a day, starting 2 days before surgery, to make sure that the level is not too high or low. o Check your blood sugar the morning of your surgery when you wake up and every 2 hours until you get to the Short Stay unit. . If your blood sugar is less than 70 mg/dL, you will need to treat for low blood sugar: o Do not take insulin. o Treat a low blood sugar (less than 70 mg/dL) with  cup of clear juice (cranberry or apple), 4 glucose tablets, OR glucose gel. o Recheck blood sugar in 15 minutes after treatment (to make sure it is greater than 70 mg/dL). If your blood sugar is not greater than 70 mg/dL on recheck, call [phone] for further instructions. . Report your blood sugar to the  short stay nurse when you get to Short Stay.  . If you are admitted to the hospital after surgery: o Your blood sugar will be checked by the staff and you will probably be given insulin after surgery (instead of oral diabetes medicines) to make sure you have good blood sugar levels. o The goal for blood sugar control after surgery is 80-180 mg/dL.   WHAT DO I DO ABOUT MY DIABETES MEDICATION?  Marland Kitchen. Do not take oral diabetes medicines (pills) the morning of surgery. .  .  the day before  BEFORE SURGERY, take your metformin as usual.       THE MORNING OF SURGERY do not take metformin     Reviewed and Endorsed by [facility] Patient Education Committee, August 2015   Take these medicines the morning of surgery with A SIP OF WATER: metoprolol tartrate DO NOT TAKE ANY DIABETIC MEDICATIONS DAY OF YOUR SURGERY                               You may not have any metal on your body including hair pins and              piercings  Do not wear jewelry, make-up, lotions, powders or perfumes, deodorant  Do not wear nail polish.  Do not shave  48 hours prior to surgery.              Men may shave face and neck.   Do not bring valuables to the hospital. [name] IS NOT             RESPONSIBLE   FOR VALUABLES.  Contacts, dentures or bridgework may not be worn into surgery.  Leave suitcase in the car. After surgery it may be brought to your room.                 Please read over the following fact sheets you were given: _____________________________________________________________________             [facility] - Preparing for Surgery Before surgery, you can play an important role.  Because skin is not sterile, your skin needs to be as free of germs as possible.  You can reduce the number of germs on your skin by washing with CHG (chlorahexidine gluconate) soap before surgery.  CHG is an antiseptic cleaner which kills germs and bonds with the skin to continue killing germs even after  washing. Please DO NOT use if you have an allergy to CHG or antibacterial soaps.  If your skin becomes reddened/irritated stop using the CHG and inform your nurse when you arrive at Short Stay. Do not shave (including legs and underarms) for at least 48 hours prior to the first CHG shower.  You may shave your face/neck. Please follow these instructions carefully:  1.  Shower with CHG Soap the night before surgery and the  morning of Surgery.  2.  If you choose to wash your hair, wash your hair first as usual with your  normal  shampoo.  3.  After you shampoo, rinse your hair and body thoroughly to remove the  shampoo.                           4.  Use CHG as you would any other liquid soap.  You can apply chg directly  to the skin and wash                       Gently with a scrungie or clean washcloth.  5.  Apply the CHG Soap to your body ONLY FROM THE NECK DOWN.   Do not use on face/ open                           Wound or open sores. Avoid contact with eyes, ears mouth and genitals (private parts).                       Wash face,  Genitals (private parts) with your normal soap.             6.  Wash thoroughly, paying special attention to the area where your surgery  will be performed.  7.  Thoroughly rinse your body with warm water from the neck down.  8.  DO NOT shower/wash with your normal soap after using and rinsing off  the CHG Soap.                9.  Pat yourself dry with a clean towel.            10.  Wear clean pajamas.  11.  Place clean sheets on your bed the night of your first shower and do not  sleep with pets. Day of Surgery : Do not apply any lotions/deodorants the morning of surgery.  Please wear clean clothes to the hospital/surgery center.  FAILURE TO FOLLOW THESE INSTRUCTIONS MAY RESULT IN THE CANCELLATION OF YOUR SURGERY PATIENT SIGNATURE_________________________________  NURSE  SIGNATURE__________________________________  ________________________________________________________________________   [name]  An incentive spirometer is a tool that can help keep your lungs clear and active. This tool measures how well you are filling your lungs with each breath. Taking long deep breaths may help reverse or decrease the chance of developing breathing (pulmonary) problems (especially infection) following:  A long period of time when you are unable to move or be active. BEFORE THE PROCEDURE   If the spirometer includes an indicator to show your [place] effort, your nurse or respiratory therapist will set it to a desired goal.  If possible, sit up straight or lean slightly forward. Try not to slouch.  Hold the incentive spirometer in an upright position. INSTRUCTIONS FOR USE  1. Sit on the edge of your bed if possible, or sit up as far as you can in bed or on a chair. 2. Hold the incentive spirometer in an upright position. 3. Breathe out normally. 4. Place the mouthpiece in your mouth and seal your lips tightly around it. 5. Breathe in slowly and as deeply as possible, raising the piston or the ball toward the top of the column. 6. Hold your breath for 3-5 seconds or for as long as possible. Allow the piston or ball to fall to the bottom of the column. 7. Remove the mouthpiece from your mouth and breathe out normally. 8. Rest for a few seconds and repeat Steps 1 through 7 at least 10 times every 1-2 hours when you are awake. Take your time and take a few normal breaths between deep breaths. 9. The spirometer may include an indicator to show your [place] effort. Use the indicator as a goal to work toward during each repetition. 10. After each set of 10 deep breaths, practice coughing to be sure your lungs are clear. If you have an incision (the cut made at the time of surgery), support your incision when coughing by placing a pillow or rolled up towels firmly  against it. Once you are able to get out of bed, walk around indoors and cough well. You may stop using the incentive spirometer when instructed by your caregiver.  RISKS AND COMPLICATIONS  Take your time so you do not get dizzy or light-headed.  If you are in pain, you may need to take or ask for pain medication before doing incentive spirometry. It is harder to take a deep breath if you are having pain. AFTER USE  Rest and breathe slowly and easily.  It can be helpful to keep track of a log of your progress. Your caregiver can provide you with a simple table to help with this. If you are using the spirometer at home, follow these instructions: [place] IF:   You are having difficultly using the spirometer.  You have trouble using the spirometer as often as instructed.  Your pain medication is not giving enough relief while using the spirometer.  You develop fever of 100.5 F (38.1 C) or higher. SEEK IMMEDIATE MEDICAL CARE IF:   You cough up bloody sputum that had not been present before.  You develop fever of 102 F (38.9 C)  or greater.  You develop worsening pain at or near the incision site. MAKE SURE YOU:   Understand these instructions.  Will watch your condition.  Will get help right away if you are not doing well or get worse. Document Released: 02/14/2007 Document Revised: 12/27/2011 Document Reviewed: 04/17/2007 ExitCare Patient Information 2014 Exit[employer], [place].   ________________________________________________________________________  WHAT IS A BLOOD TRANSFUSION? Blood Transfusion Information  A transfusion is the replacement of blood or some of its parts. Blood is made up of multiple cells which provide different functions.  Red blood cells carry oxygen and are used for blood loss replacement.  White blood cells fight against infection.  Platelets control bleeding.  Plasma helps clot blood.  Other blood products are available for  specialized needs, such as hemophilia or other clotting disorders. BEFORE THE TRANSFUSION  Who gives blood for transfusions?   Healthy volunteers who are fully evaluated to make sure their blood is safe. This is blood bank blood. Transfusion therapy is the safest it has ever been in the practice of medicine. Before blood is taken from a donor, a complete history is taken to make sure that person has no history of diseases nor engages in risky social behavior (examples are intravenous drug use or sexual activity with multiple partners). The donor's travel history is screened to minimize risk of transmitting infections, such as malaria. The donated blood is tested for signs of infectious diseases, such as HIV and hepatitis. The blood is then tested to be sure it is compatible with you in order to minimize the chance of a transfusion reaction. If you or a relative donates blood, this is often done in anticipation of surgery and is not appropriate for emergency situations. It takes many days to process the donated blood. RISKS AND COMPLICATIONS Although transfusion therapy is very safe and saves many lives, the main dangers of transfusion include:   Getting an infectious disease.  Developing a transfusion reaction. This is an allergic reaction to something in the blood you were given. Every precaution is taken to prevent this. The decision to have a blood transfusion has been considered carefully by your caregiver before blood is given. Blood is not given unless the benefits outweigh the risks. AFTER THE TRANSFUSION  Right after receiving a blood transfusion, you will usually feel much better and more energetic. This is especially true if your red blood cells have gotten low (anemic). The transfusion raises the level of the red blood cells which carry oxygen, and this usually causes an energy increase.  The nurse administering the transfusion will monitor you carefully for complications. HOME CARE  INSTRUCTIONS  No special instructions are needed after a transfusion. You may find your energy is better. Speak with your caregiver about any limitations on activity for underlying diseases you may have. SEEK MEDICAL CARE IF:   Your condition is not improving after your transfusion.  You develop redness or irritation at the intravenous (IV) site. SEEK IMMEDIATE MEDICAL CARE IF:  Any of the following symptoms occur over the next 12 hours:  Shaking chills.  You have a temperature by mouth above 102 F (38.9 C), not controlled by medicine.  Chest, back, or muscle pain.  People around you feel you are not acting correctly or are confused.  Shortness of breath or difficulty breathing.  Dizziness and fainting.  You get a rash or develop hives.  You have a decrease in urine output.  Your urine turns a dark color or changes to pink, red,  or brown. Any of the following symptoms occur over the next 10 days:  You have a temperature by mouth above 102 F (38.9 C), not controlled by medicine.  Shortness of breath.  Weakness after normal activity.  The white part of the eye turns yellow (jaundice).  You have a decrease in the amount of urine or are urinating less often.  Your urine turns a dark color or changes to pink, red, or brown. Document Released: 10/01/2000 Document Revised: 12/27/2011 Document Reviewed: 05/20/2008 [facility] Patient Information 2014 [place], [place].  _______________________________________________________________________

## 2018-10-27 NOTE — Progress Notes (Addendum)
ekg 8-8--19 epic

## 2018-10-29 NOTE — Progress Notes (Signed)
Subjective:    Patient ID: [name] BienCraig Dominik, male    DOB: 01/26/1961, 58 y.o.   MRN: [id number]  HPI He is here for pre-operative clearance at the request of Dr Linna CapriceSwinteck for left total hip arthroplasty scheduled for 11/02/2018. He had his right hip replaced last fall and did well.  He has failed conservative treatment of the left hip osteoarthritis and is eager to have surgery.    He denies any personal or family history of problems with anesthesia or bleeding/blood clot problems.    He is not exercising regularly due to his hip pain and recent right hip replacement dislocation.  With his daily activities he denies chest pain, palpitations, SOB and lightheadedness.    Leg edema; when I last saw him he was taking 20-40 mg of lasix daily. In the past few months he increased his dose - he takes two lasix in the  morning and 1 at night.  Sometimes he takes one twice daily.    He feels like his ears are clogged and everything is muffled.  His girlfriend washed his hair the other day and he got water in his ears and since then they have been muffled, clogged and he can not hear as well.  He denies fever, chills, sinus pain and dizziness.      Medications and allergies reviewed with patient and updated if appropriate.  Patient Active Problem List   Diagnosis Date Noted  . Preop examination 10/30/2018  . Avascular necrosis of hip, right (HCC) 08/16/2018  . Bilateral leg edema 02/27/2018  . Diabetic foot infection (HCC) 02/16/2018  . Alcohol dependence syndrome (HCC) 02/16/2018  . Marijuana abuse 02/16/2018  . Diabetic ulcer of right midfoot associated with type 2 diabetes mellitus, with necrosis of muscle (HCC) 11/16/2017  . Foot ulcer (HCC) 12/21/2016  . Bilateral carpal tunnel syndrome 12/15/2016  . Fatty liver 12/14/2016  . Hyperlipidemia 11/25/2016  . Elevated liver enzymes 11/25/2016  . Diabetes (HCC) 11/25/2016  . Hypertension 11/23/2016  . Neuropathy 11/23/2016  . Toe amputation  status, right 11/23/2016  . Morbid obesity (HCC) 11/23/2016  . OSA (obstructive sleep apnea) 11/23/2016  . Other hammer toe (acquired) 11/11/2013  . Exstrophy of bladder 11/11/2013    Current Outpatient Medications on File Prior to Visit  Medication Sig Dispense Refill  . aspirin 81 MG chewable tablet Chew 1 tablet (81 mg total) by mouth 2 (two) times daily. 60 tablet 1  . calcium carbonate (TUMS - DOSED IN MG ELEMENTAL CALCIUM) 500 MG chewable tablet Chew 2 tablets by mouth daily as needed for indigestion or heartburn.    . diphenhydrAMINE (BENADRYL) 25 MG tablet Take 25 mg by mouth at bedtime as needed for allergies or sleep.     . folic acid (FOLVITE) 1 MG tablet Take 1 tablet (1 mg total) by mouth daily. 30 tablet 0  . furosemide (LASIX) 20 MG tablet Take 2 tablets (40 mg total) by mouth daily. 180 tablet 1  . losartan (COZAAR) 50 MG tablet Take 1 tablet (50 mg total) by mouth daily. 90 tablet 3  . metFORMIN (GLUCOPHAGE) 500 MG tablet Take 1 tablet (500 mg total) by mouth 2 (two) times daily with a meal. 180 tablet 3  . metoprolol tartrate (LOPRESSOR) 100 MG tablet TAKE 1 TABLET (100 MG TOTAL) BY MOUTH 2 (TWO) TIMES DAILY. 180 tablet 3  . Multiple Vitamin (MULTIVITAMIN WITH MINERALS) TABS tablet Take 1 tablet by mouth daily. 30 tablet 0  . senna (SENOKOT) 8.6  MG TABS tablet Take 1 tablet (8.6 mg total) by mouth 2 (two) times daily. 120 each 0  . thiamine 100 MG tablet Take 1 tablet (100 mg total) by mouth daily. 30 tablet 0  . traMADol (ULTRAM) 50 MG tablet Take 50 mg by mouth 2 (two) times daily.     No current facility-administered medications on file prior to visit.     Past Medical History:  Diagnosis Date  . Alcohol dependence (HCC)   . Arthritis    hips, hands  . Bilateral carpal tunnel syndrome   . Bilateral leg edema    Chronic  . Diabetes mellitus without complication (HCC)    type 2  . Diverticulitis    portion of colon removed  . DOE (dyspnea on exertion)    occ    . Elevated liver enzymes   . Fatty liver   . GERD (gastroesophageal reflux disease)    occ  . Hammer toe   . Hip pain   . History of ventral hernia repair 2016   x2  . Hyperlipidemia    pt unsure  . Hypertension   . Marijuana abuse   . Morbid obesity (HCC)   . Neuromuscular disorder (HCC)    peripheral neuropathy feet and few fingers  . OSA (obstructive sleep apnea)    has OSA-not used CPAP 2-3 yrs could not tolerate cpap  . PONV (postoperative nausea and vomiting)   . Toe ulcer (HCC)    left healed    Past Surgical History:  Procedure Laterality Date  . AMPUTATION TOE Left 05/25/2018   Procedure: AMPUTATION TOE left 3rd;  Surgeon: Toni Arthurs, MD;  Location: [facility];  Service: Orthopedics;  Laterality: Left;  , to follow  . AMPUTATION TOE Left 06/28/2018   Procedure: Left foot revision 3rd toe amputation including 3rd metatarsal;  Surgeon: Toni Arthurs, MD;  Location: [facility];  Service: Orthopedics;  Laterality: Left;   . BICEPS TENDON REPAIR Left 2014   Partial  . COLONOSCOPY    . HERNIA REPAIR  2016   ventral  . HIP CLOSED REDUCTION Right 09/01/2018   Procedure: CLOSED REDUCTION HIP;  Surgeon: Yolonda Kida, MD;  Location: [facility];  Service: Orthopedics;  Laterality: Right;  . JOINT REPLACEMENT     b/l knees   . TOE AMPUTATION Right 09/2013  . TOTAL HIP ARTHROPLASTY Right 08/16/2018   Procedure: TOTAL HIP ARTHROPLASTY ANTERIOR APPROACH;  Surgeon: Samson Frederic, MD;  Location: [facility];  Service: Orthopedics;  Laterality: Right;  . TOTAL KNEE ARTHROPLASTY     bilat    Social History   Socioeconomic History  . Marital status: Divorced    Spouse name: Not on file  . Number of children: Not on file  . Years of education: Not on file  . Highest education level: Not on file  Occupational History  . Occupation: unemployed, Press photographer for disability  Social Needs  . Financial resource strain: Not on file  . Food  insecurity:    Worry: Not on file    Inability: Not on file  . Transportation needs:    Medical: Not on file    Non-medical: Not on file  Tobacco Use  . Smoking status: Former Smoker    Packs/day: 0.25    Years: 10.00    Pack years: 2.50    Types: Cigarettes  . Smokeless tobacco: Never Used  . Tobacco comment: quit 2018  Substance and Sexual Activity  . Alcohol  use: Yes    Comment: 2 liquor drinks /night  . Drug use: Yes    Frequency: 3.0 times per week    Types: Marijuana    Comment: couple times per week  . Sexual activity: Not on file  Lifestyle  . Physical activity:    Days per week: Not on file    Minutes per session: Not on file  . Stress: Not on file  Relationships  . Social connections:    Talks on phone: Not on file    Gets together: Not on file    Attends religious service: Not on file    Active member of club or organization: Not on file    Attends meetings of clubs or organizations: Not on file    Relationship status: Not on file  Other Topics Concern  . Not on file  Social History Narrative  . Not on file    Family History  Adopted: Yes  Family history unknown: Yes    Review of Systems  Constitutional: Negative for chills and fever.  HENT: Positive for hearing loss. Negative for congestion, ear pain (clogged, muffled), sinus pain and sore throat.   Eyes: Negative for visual disturbance.  Respiratory: Negative for cough, shortness of breath and wheezing.   Cardiovascular: Positive for leg swelling. Negative for chest pain and palpitations.  Gastrointestinal: Negative for abdominal pain, blood in stool, constipation, diarrhea and nausea.       Occ GERD  Genitourinary: Negative for dysuria and hematuria.  Neurological: Negative for dizziness, light-headedness and headaches.  Psychiatric/Behavioral: Positive for dysphoric mood. The patient is not nervous/anxious.        Objective:   Vitals:   10/30/18 0827  BP: 112/62  Pulse: 80  Resp: 16    Temp: 98.4 F (36.9 C)  SpO2: 96%   Filed Weights   Body mass index is 36.94 kg/m.  Wt Readings from Last 3 Encounters:  08/16/18 280 lb (127 kg)  06/28/18 275 lb (124.7 kg)  05/25/18 280 lb (127 kg)     Physical Exam Constitutional: He appears well-developed and well-nourished. No distress.  HENT:  Head: Normocephalic and atraumatic.  Right Ear: External ear normal.  Left Ear: External ear normal.  Mouth/Throat: Oropharynx is clear and moist.  Eyes: Conjunctivae and EOM are normal.  Neck: Neck supple. No tracheal deviation present. No thyromegaly present.  No carotid bruit  Cardiovascular: Normal rate, regular rhythm, normal heart sounds and intact distal pulses.  No murmur heard.  2+ pitting edema B/L lower extremities with chronic skin changes  Pulmonary/Chest: Effort normal and breath sounds normal. No respiratory distress. He has no wheezes. He has no rales.  Abdominal: Soft. He exhibits no distension. There is no tenderness.  Lymphadenopathy:   He has no cervical adenopathy.  Skin: Skin is warm and dry. He is not diaphoretic.  Psychiatric: He has a normal mood and affect. His behavior is normal.    PRE-PROCEDURE EXAM: Left TM cannot be visualized due to total occlusion/impaction of the ear canal. PROCEDURE INDICATION: remove wax to visualize ear drum, relieve discomfort/clogged sensation and improve hearing CONSENT:  Verbal  PROCEDURE NOTE:   LEFT EAR:  The CMA used a metal wax curette under direct vision with an otoscope to free the wax bolus from the ear wall and then successfully removed a small bit of wax. The ear was then irrigated with warm water to remove most of the wax. POST- PROCEDURE EXAM: TM partially visualized and found to have no  erythema.  Some wax remained in the canal but he did not want to continue with the procedure due to discomfort.  His hearing was improved.  otc ear wax removal drops were recommended.        Assessment & Plan:     See  Problem List for Assessment and Plan of chronic medical problems.

## 2018-10-30 ENCOUNTER — Encounter: Payer: Self-pay | Admitting: Internal Medicine

## 2018-10-30 ENCOUNTER — Telehealth: Payer: Self-pay | Admitting: Internal Medicine

## 2018-10-30 ENCOUNTER — Other Ambulatory Visit (INDEPENDENT_AMBULATORY_CARE_PROVIDER_SITE_OTHER): Payer: Medicare Other

## 2018-10-30 ENCOUNTER — Ambulatory Visit (INDEPENDENT_AMBULATORY_CARE_PROVIDER_SITE_OTHER): Payer: Medicare Other | Admitting: Internal Medicine

## 2018-10-30 VITALS — BP 112/62 | HR 80 | Temp 98.4°F | Resp 16 | Ht 73.0 in

## 2018-10-30 DIAGNOSIS — I10 Essential (primary) hypertension: Secondary | ICD-10-CM

## 2018-10-30 DIAGNOSIS — R944 Abnormal results of kidney function studies: Secondary | ICD-10-CM

## 2018-10-30 DIAGNOSIS — H9193 Unspecified hearing loss, bilateral: Secondary | ICD-10-CM | POA: Diagnosis not present

## 2018-10-30 DIAGNOSIS — E1142 Type 2 diabetes mellitus with diabetic polyneuropathy: Secondary | ICD-10-CM

## 2018-10-30 DIAGNOSIS — Z01818 Encounter for other preprocedural examination: Secondary | ICD-10-CM

## 2018-10-30 LAB — BASIC METABOLIC PANEL
BUN: 23 mg/dL (ref 6–23)
CALCIUM: 10 mg/dL (ref 8.4–10.5)
CO2: 27 mEq/L (ref 19–32)
Chloride: 87 mEq/L — ABNORMAL LOW (ref 96–112)
Creatinine, Ser: 2.26 mg/dL — ABNORMAL HIGH (ref 0.40–1.50)
GFR: 31.92 mL/min — ABNORMAL LOW (ref >60.00)
Glucose, Bld: 118 mg/dL — ABNORMAL HIGH (ref 70–99)
Potassium: 3.1 mEq/L — ABNORMAL LOW (ref 3.5–5.1)
Sodium: 131 mEq/L — ABNORMAL LOW (ref 135–145)

## 2018-10-30 MED ORDER — POTASSIUM CHLORIDE CRYS ER 20 MEQ PO TBCR: 20.0000 meq | EXTENDED_RELEASE_TABLET | Freq: Every day | ORAL | 3 refills | Status: DC

## 2018-10-30 MED ORDER — DULOXETINE HCL 20 MG PO CPEP: 20.0000 mg | ORAL_CAPSULE | Freq: Every day | ORAL | 3 refills | Status: DC

## 2018-10-30 MED ORDER — AMLODIPINE BESYLATE 5 MG PO TABS: 5.0000 mg | ORAL_TABLET | Freq: Every day | ORAL | 5 refills | Status: DC

## 2018-10-30 NOTE — Patient Instructions (Addendum)
  Tests ordered today. Your results will be released to MyChart (or called to you) after review, usually within 72hours after test completion. If any changes need to be made, you will be notified at that same time.   Medications reviewed and updated.  Changes include :   Restart the cymbalta.  Hold metformin until after surgery.  Take lasix (water pill) 40 mg in the morning - do not take any in the afternoon.    Your prescription(s) have been submitted to your pharmacy. Please take as directed and contact our office if you believe you are having problem(s) with the medication(s).   Please followup in  6 months

## 2018-10-30 NOTE — Progress Notes (Signed)
Anesthesia Chart Review   Case:  [id number] Date/Time:  11/02/18 0715   Procedure:  TOTAL HIP ARTHROPLASTY ANTERIOR APPROACH (Left )   Anesthesia type:  Spinal   Pre-op diagnosis:  Avascular necrosis left hip   Location:  [facility]   Surgeon:  Samson Frederic, MD      DISCUSSION: 57 yo former smoker (2.5 pack years) with h/o PONV, HTN, alcohol dependence, marijuana abuse, GERD, DM II, HLD, OSA (cannot tolerate CPAP), peripheral neuropathy scheduled for above surgery on 11/02/18 with Dr. Samson Frederic.   He was last seen by PCP, Dr. [name], 10/30/2018.  PCP reports HTN well controlled with medication.  Per Dr. Lawerance Bach note labs from PST visit on 10/27/18 reviewed.  She reports elevated creatinine and GFR most likely due to the patient increasing his lasix dose on his own over the last few months.  Instructions given to decrease dose.  She also reports pt has not been drinking as much fluids recently. Will recheck DOS. Dr. Lawerance Bach reports, "Ok to proceed with surgery, may need BMP repeated, but will not hold up surgery-will send note to Dr. Linna Caprice."  Pt can proceed with planned procedure barring acute status change.  VS: BP (!) 103/55   Pulse 66   Temp (!) 36.3 C (Oral)   Ht 6\' 1"  (1.854 m) Comment: weight day of surgery unable to stand  SpO2 94%   BMI 36.94 kg/m   PROVIDERS: [name], MD is PCP   LABS: Labs reviewed: Repeat Istat 8 DOS (all labs ordered are listed, but only abnormal results are displayed)  Labs Reviewed  CBC - Abnormal; Notable for the following components:      Result Value   RBC 3.46 (*)    Hemoglobin 11.9 (*)    HCT 35.8 (*)    MCV 103.5 (*)    MCH 34.4 (*)    All other components within normal limits  GLUCOSE, CAPILLARY - Abnormal; Notable for the following components:   Glucose-Capillary 113 (*)    All other components within normal limits  COMPREHENSIVE METABOLIC PANEL - Abnormal; Notable for the following components:   Sodium 132 (*)     Potassium 3.3 (*)    Chloride 86 (*)    Glucose, Bld 126 (*)    BUN 25 (*)    Creatinine, Ser 1.93 (*)    AST 56 (*)    Total Bilirubin 1.4 (*)    GFR calc non Af Amer 38 (*)    GFR calc Af Amer 44 (*)    Anion gap 18 (*)    All other components within normal limits  SURGICAL PCR SCREEN  HEMOGLOBIN A1C  TYPE AND SCREEN     IMAGES: Portable Pelvis 08/16/18 FINDINGS: Right uncemented total hip arthroplasty with postop soft tissue emphysema. No malalignment or hardware failure. Marked flattening of the weight-bearing portion left femoral head consistent with advanced changes of AVN. Surgical tacks from prior hernia repair noted projecting the left lower quadrant.  IMPRESSION: No complicating features status post right hip arthroplasty. AVN of the left femoral head with flattening.  EKG: 05/25/18 Rate 65 bpm Normal sinus rhythm Normal ECG  CV:  Past Medical History:  Diagnosis Date  . Alcohol dependence (HCC)   . Arthritis    hips, hands  . Bilateral carpal tunnel syndrome   . Bilateral leg edema    Chronic  . Diabetes mellitus without complication (HCC)    type 2  . Diverticulitis  portion of colon removed  . DOE (dyspnea on exertion)    occ  . Elevated liver enzymes   . Fatty liver   . GERD (gastroesophageal reflux disease)    occ  . Hammer toe   . Hip pain   . History of ventral hernia repair 2016   x2  . Hyperlipidemia    pt unsure  . Hypertension   . Marijuana abuse   . Morbid obesity (HCC)   . Neuromuscular disorder (HCC)    peripheral neuropathy feet and few fingers  . OSA (obstructive sleep apnea)    has OSA-not used CPAP 2-3 yrs could not tolerate cpap  . PONV (postoperative nausea and vomiting)   . Toe ulcer (HCC)    left healed    Past Surgical History:  Procedure Laterality Date  . AMPUTATION TOE Left 05/25/2018   Procedure: AMPUTATION TOE left 3rd;  Surgeon: Toni Arthurs, MD;  Location: [facility];  Service:  Orthopedics;  Laterality: Left;  , to follow  . AMPUTATION TOE Left 06/28/2018   Procedure: Left foot revision 3rd toe amputation including 3rd metatarsal;  Surgeon: Toni Arthurs, MD;  Location: [facility];  Service: Orthopedics;  Laterality: Left;   . BICEPS TENDON REPAIR Left 2014   Partial  . COLONOSCOPY    . HERNIA REPAIR  2016   ventral  . HIP CLOSED REDUCTION Right 09/01/2018   Procedure: CLOSED REDUCTION HIP;  Surgeon: Yolonda Kida, MD;  Location: [facility];  Service: Orthopedics;  Laterality: Right;  . JOINT REPLACEMENT     b/l knees   . TOE AMPUTATION Right 09/2013  . TOTAL HIP ARTHROPLASTY Right 08/16/2018   Procedure: TOTAL HIP ARTHROPLASTY ANTERIOR APPROACH;  Surgeon: Samson Frederic, MD;  Location: [facility];  Service: Orthopedics;  Laterality: Right;  . TOTAL KNEE ARTHROPLASTY     bilat    MEDICATIONS: . aspirin 81 MG chewable tablet  . calcium carbonate (TUMS - DOSED IN MG ELEMENTAL CALCIUM) 500 MG chewable tablet  . diphenhydrAMINE (BENADRYL) 25 MG tablet  . DULoxetine (CYMBALTA) 20 MG capsule  . folic acid (FOLVITE) 1 MG tablet  . furosemide (LASIX) 20 MG tablet  . losartan (COZAAR) 50 MG tablet  . metFORMIN (GLUCOPHAGE) 500 MG tablet  . metoprolol tartrate (LOPRESSOR) 100 MG tablet  . Multiple Vitamin (MULTIVITAMIN WITH MINERALS) TABS tablet  . senna (SENOKOT) 8.6 MG TABS tablet  . thiamine 100 MG tablet  . traMADol (ULTRAM) 50 MG tablet   No current facility-administered medications for this encounter.      Janey Genta [facility] Pre-Surgical Testing [phone] 10/30/18 10:35 AM

## 2018-10-30 NOTE — Assessment & Plan Note (Signed)
On metformin - a1c 5.6% Will hold metformin for now given dec GFR - will restart after surgery

## 2018-10-30 NOTE — Assessment & Plan Note (Signed)
Reviewed pre-op labs - decreased GFR - likely related to increased lasix which he did on his own at some point over the past few months - decrease lasix to 40 mg daily, bmp today, stressed low sodium diet, elevate legs Hold metformin for a few days - restart after surgery Increase fluids - he has not been drinking as much

## 2018-10-30 NOTE — Anesthesia Preprocedure Evaluation (Addendum)
Anesthesia Evaluation  Patient identified by MRN, date of birth, ID band Patient awake    Reviewed: Allergy & Precautions, NPO status , Patient's Chart, lab work & pertinent test results, reviewed documented beta blocker date and time   History of Anesthesia Complications (+) PONV and history of anesthetic complications  Airway Mallampati: III  TM Distance: >3 FB Neck ROM: Full    Dental  (+) Chipped,    Pulmonary sleep apnea , former smoker,    Pulmonary exam normal breath sounds clear to auscultation       Cardiovascular hypertension, Pt. on medications and Pt. on home beta blockers Normal cardiovascular exam Rhythm:Regular Rate:Normal  ECG: NSR, rate 65   Neuro/Psych PSYCHIATRIC DISORDERS negative neurological ROS     GI/Hepatic (+)     substance abuse  ,   Endo/Other  diabetes, Oral Hypoglycemic Agents  Renal/GU CRFRenal disease     Musculoskeletal  (+) Arthritis ,   Abdominal   Peds  Hematology  (+) anemia , HLD   Anesthesia Other Findings Avascular necrosis left hip  Reproductive/Obstetrics                          Anesthesia Physical Anesthesia Plan  ASA: III  Anesthesia Plan: Spinal   Post-op Pain Management:    Induction:   PONV Risk Score and Plan: 2 and Ondansetron, Propofol infusion and Treatment may vary due to age or medical condition  Airway Management Planned: Natural Airway  Additional Equipment:   Intra-op Plan:   Post-operative Plan:   Informed Consent: I have reviewed the patients History and Physical, chart, labs and discussed the procedure including the risks, benefits and alternatives for the proposed anesthesia with the patient or authorized representative who has indicated his/her understanding and acceptance.     Dental advisory given  Plan Discussed with: CRNA  Anesthesia Plan Comments: (Reviewed PST note 10/27/2018, Jodell Cipro, PA-C.   Istat 8 DOS  Multi-modal: spinal and acetaminophen )      Anesthesia Quick Evaluation

## 2018-10-30 NOTE — Assessment & Plan Note (Signed)
Left ear with impacted cerumen causing clogged sensation and decreased hearing Right ear clear - possibly some ETD  Ear lavage of left ear - see procedure note

## 2018-10-30 NOTE — Assessment & Plan Note (Addendum)
Pre - op for [facility] scheduled for 1/16 Reviewed pre-op labs - decreased GFR - likely related to increased lasix which he did on his own at some point over the past few months - decrease lasix to 40 mg daily, bmp today, stressed low sodium diet, elevate legs Hold metformin for a few days - restart after surgery Increase fluids - he has not been drinking as much  Ok to proceed with surgery - may need BMP repeated, but will not hold up surgery - will send note to Dr Linna Caprice

## 2018-10-30 NOTE — Telephone Encounter (Signed)
Spoke with pt and let him know response below. Expressed understanding. Did state that he will try to get here on wed for repeat lab draw but may not have transportation.

## 2018-10-30 NOTE — Assessment & Plan Note (Signed)
BP well controlled Current regimen effective and well tolerated Continue current medications at current doses bmp 

## 2018-10-30 NOTE — Telephone Encounter (Signed)
Kidney function slightly worse than Friday   Hold losartan until after surgery.    Start amlodipine 5 mg daily ( to keep BP controlled)  Start potassium 20 meq daily.      Repeat bmp on Wednesday.      New medications sent and BMP ordered

## 2018-10-31 ENCOUNTER — Telehealth: Payer: Self-pay

## 2018-10-31 NOTE — Telephone Encounter (Signed)
Copied from CRM [id number]. Topic: General - Other >> Oct 31, 2018  3:39 PM Elliot Gault wrote: Relation to pt: self  Call back number: [phone]   Reason for call:  Patient has upcoming surgery and was last seen 10/30/2018 by Dr. Lawerance Bach, patient states he's a little confused regarding what medication were d/c and what new medications should he be starting, please advise

## 2018-10-31 NOTE — Telephone Encounter (Signed)
Spoke with pt to clarify directions with BP medications.

## 2018-11-01 MED ORDER — DEXTROSE 5 % IV SOLN
3.0000 g | INTRAVENOUS | Status: DC
  Filled 2018-11-01: qty 3000

## 2018-11-01 MED ORDER — DEXTROSE 5 % IV SOLN
3.0000 g | INTRAVENOUS | Status: AC
  Administered 2018-11-02: 3 g via INTRAVENOUS
  Filled 2018-11-01: qty 3
  Filled 2018-11-01: qty 3000

## 2018-11-02 ENCOUNTER — Inpatient Hospital Stay (HOSPITAL_COMMUNITY): Payer: BLUE CROSS/BLUE SHIELD

## 2018-11-02 ENCOUNTER — Encounter (HOSPITAL_COMMUNITY)
Admission: RE | Disposition: A | Discharge status: Home / Self Care | Payer: Self-pay | Source: Home / Self Care | Attending: Orthopedic Surgery

## 2018-11-02 ENCOUNTER — Encounter (HOSPITAL_COMMUNITY): Payer: Self-pay | Admitting: Emergency Medicine

## 2018-11-02 ENCOUNTER — Inpatient Hospital Stay (HOSPITAL_COMMUNITY): Payer: BLUE CROSS/BLUE SHIELD | Admitting: Physician Assistant

## 2018-11-02 ENCOUNTER — Other Ambulatory Visit: Payer: Self-pay

## 2018-11-02 ENCOUNTER — Inpatient Hospital Stay (HOSPITAL_COMMUNITY)
Admission: RE | Admit: 2018-11-02 | Discharge: 2018-11-03 | DRG: 470 | Disposition: A | Discharge status: Home / Self Care | Payer: BLUE CROSS/BLUE SHIELD | Attending: Orthopedic Surgery | Admitting: Orthopedic Surgery

## 2018-11-02 DIAGNOSIS — M25752 Osteophyte, left hip: Secondary | ICD-10-CM | POA: Diagnosis present

## 2018-11-02 DIAGNOSIS — Z96642 Presence of left artificial hip joint: Secondary | ICD-10-CM | POA: Diagnosis present

## 2018-11-02 DIAGNOSIS — I10 Essential (primary) hypertension: Secondary | ICD-10-CM | POA: Diagnosis not present

## 2018-11-02 DIAGNOSIS — Z96653 Presence of artificial knee joint, bilateral: Secondary | ICD-10-CM | POA: Diagnosis present

## 2018-11-02 DIAGNOSIS — Z79899 Other long term (current) drug therapy: Secondary | ICD-10-CM | POA: Diagnosis not present

## 2018-11-02 DIAGNOSIS — Z888 Allergy status to other drugs, medicaments and biological substances status: Secondary | ICD-10-CM | POA: Diagnosis not present

## 2018-11-02 DIAGNOSIS — G4733 Obstructive sleep apnea (adult) (pediatric): Secondary | ICD-10-CM | POA: Diagnosis present

## 2018-11-02 DIAGNOSIS — Z9049 Acquired absence of other specified parts of digestive tract: Secondary | ICD-10-CM | POA: Diagnosis not present

## 2018-11-02 DIAGNOSIS — M87852 Other osteonecrosis, left femur: Secondary | ICD-10-CM | POA: Diagnosis not present

## 2018-11-02 DIAGNOSIS — K760 Fatty (change of) liver, not elsewhere classified: Secondary | ICD-10-CM | POA: Diagnosis not present

## 2018-11-02 DIAGNOSIS — Z7984 Long term (current) use of oral hypoglycemic drugs: Secondary | ICD-10-CM

## 2018-11-02 DIAGNOSIS — M1612 Unilateral primary osteoarthritis, left hip: Secondary | ICD-10-CM | POA: Diagnosis not present

## 2018-11-02 DIAGNOSIS — E11628 Type 2 diabetes mellitus with other skin complications: Secondary | ICD-10-CM | POA: Diagnosis not present

## 2018-11-02 DIAGNOSIS — E785 Hyperlipidemia, unspecified: Secondary | ICD-10-CM | POA: Diagnosis not present

## 2018-11-02 DIAGNOSIS — M87052 Idiopathic aseptic necrosis of left femur: Secondary | ICD-10-CM | POA: Diagnosis not present

## 2018-11-02 DIAGNOSIS — Z471 Aftercare following joint replacement surgery: Secondary | ICD-10-CM | POA: Diagnosis not present

## 2018-11-02 DIAGNOSIS — Z7982 Long term (current) use of aspirin: Secondary | ICD-10-CM

## 2018-11-02 DIAGNOSIS — Z87891 Personal history of nicotine dependence: Secondary | ICD-10-CM

## 2018-11-02 DIAGNOSIS — Z89422 Acquired absence of other left toe(s): Secondary | ICD-10-CM

## 2018-11-02 DIAGNOSIS — E1142 Type 2 diabetes mellitus with diabetic polyneuropathy: Secondary | ICD-10-CM | POA: Diagnosis present

## 2018-11-02 DIAGNOSIS — Z09 Encounter for follow-up examination after completed treatment for conditions other than malignant neoplasm: Secondary | ICD-10-CM

## 2018-11-02 DIAGNOSIS — Z419 Encounter for procedure for purposes other than remedying health state, unspecified: Secondary | ICD-10-CM

## 2018-11-02 HISTORY — PX: TOTAL HIP ARTHROPLASTY: SHX124

## 2018-11-02 LAB — GLUCOSE, CAPILLARY
GLUCOSE-CAPILLARY: 284 mg/dL — AB (ref 70–99)
GLUCOSE-CAPILLARY: 152 mg/dL — AB (ref 70–99)
Glucose-Capillary: 133 mg/dL — ABNORMAL HIGH (ref 70–99)
Glucose-Capillary: 154 mg/dL — ABNORMAL HIGH (ref 70–99)

## 2018-11-02 LAB — POCT I-STAT 4, (NA,K, GLUC, HGB,HCT)
Glucose, Bld: 149 mg/dL — ABNORMAL HIGH (ref 70–99)
HCT: 32 % — ABNORMAL LOW (ref 39.0–52.0)
Hemoglobin: 10.9 g/dL — ABNORMAL LOW (ref 13.0–17.0)
Potassium: 3.3 mmol/L — ABNORMAL LOW (ref 3.5–5.1)
Sodium: 134 mmol/L — ABNORMAL LOW (ref 135–145)

## 2018-11-02 LAB — TYPE AND SCREEN
ABO/RH(D): B POS
Antibody Screen: NEGATIVE

## 2018-11-02 SURGERY — ARTHROPLASTY, HIP, TOTAL, ANTERIOR APPROACH
Anesthesia: Spinal | Laterality: Left

## 2018-11-02 MED ORDER — VITAMIN B-1 100 MG PO TABS
100.0000 mg | ORAL_TABLET | Freq: Every day | ORAL | Status: DC
  Administered 2018-11-02 – 2018-11-03 (×2): 100 mg via ORAL
  Filled 2018-11-02 (×2): qty 1

## 2018-11-02 MED ORDER — INSULIN ASPART 100 UNIT/ML ~~LOC~~ SOLN: 0.0000 [IU] | Freq: Every day | SUBCUTANEOUS | Status: DC

## 2018-11-02 MED ORDER — HYDROCODONE-ACETAMINOPHEN 5-325 MG PO TABS
1.0000 | ORAL_TABLET | ORAL | Status: DC | PRN
  Administered 2018-11-02: 1 via ORAL
  Filled 2018-11-02: qty 1

## 2018-11-02 MED ORDER — KETOROLAC TROMETHAMINE 30 MG/ML IJ SOLN
INTRAMUSCULAR | Status: DC | PRN
  Administered 2018-11-02: 30 mg

## 2018-11-02 MED ORDER — ACETAMINOPHEN 10 MG/ML IV SOLN: 1000.0000 mg | INTRAVENOUS | Status: DC

## 2018-11-02 MED ORDER — 0.9 % SODIUM CHLORIDE (POUR BTL) OPTIME
TOPICAL | Status: DC | PRN
  Administered 2018-11-02: 1000 mL

## 2018-11-02 MED ORDER — LACTATED RINGERS IV SOLN
INTRAVENOUS | Status: DC | PRN
  Administered 2018-11-02 (×3): via INTRAVENOUS

## 2018-11-02 MED ORDER — CHLORHEXIDINE GLUCONATE 4 % EX LIQD: 60.0000 mL | Freq: Once | CUTANEOUS | Status: DC

## 2018-11-02 MED ORDER — SODIUM CHLORIDE (PF) 0.9 % IJ SOLN
INTRAMUSCULAR | Status: DC | PRN
  Administered 2018-11-02: 30 mL

## 2018-11-02 MED ORDER — PROPOFOL 10 MG/ML IV BOLUS
INTRAVENOUS | Status: AC
  Filled 2018-11-02: qty 60

## 2018-11-02 MED ORDER — BUPIVACAINE-EPINEPHRINE 0.25% -1:200000 IJ SOLN
INTRAMUSCULAR | Status: DC | PRN
  Administered 2018-11-02: 30 mL

## 2018-11-02 MED ORDER — WATER FOR IRRIGATION, STERILE IR SOLN
Status: DC | PRN
  Administered 2018-11-02: 1000 mL

## 2018-11-02 MED ORDER — MIDAZOLAM HCL 2 MG/2ML IJ SOLN
INTRAMUSCULAR | Status: AC
  Filled 2018-11-02: qty 2

## 2018-11-02 MED ORDER — DEXAMETHASONE SODIUM PHOSPHATE 10 MG/ML IJ SOLN
INTRAMUSCULAR | Status: DC | PRN
  Administered 2018-11-02: 4 mg via INTRAVENOUS

## 2018-11-02 MED ORDER — LORAZEPAM 1 MG PO TABS: 1.0000 mg | ORAL_TABLET | Freq: Four times a day (QID) | ORAL | Status: DC | PRN

## 2018-11-02 MED ORDER — ISOPROPYL ALCOHOL 70 % SOLN
Status: DC | PRN
  Administered 2018-11-02: 1 via TOPICAL

## 2018-11-02 MED ORDER — PHENYLEPHRINE HCL-NACL 10-0.9 MG/250ML-% IV SOLN
INTRAVENOUS | Status: AC
  Filled 2018-11-02: qty 250

## 2018-11-02 MED ORDER — PHENYLEPHRINE 40 MCG/ML (10ML) SYRINGE FOR IV PUSH (FOR BLOOD PRESSURE SUPPORT)
PREFILLED_SYRINGE | INTRAVENOUS | Status: DC | PRN
  Administered 2018-11-02: 80 ug via INTRAVENOUS

## 2018-11-02 MED ORDER — ACETAMINOPHEN 500 MG PO TABS
ORAL_TABLET | ORAL | Status: AC
  Filled 2018-11-02: qty 2

## 2018-11-02 MED ORDER — PROPOFOL 500 MG/50ML IV EMUL
INTRAVENOUS | Status: DC | PRN
  Administered 2018-11-02: 150 ug/kg/min via INTRAVENOUS

## 2018-11-02 MED ORDER — LORAZEPAM 1 MG PO TABS: 0.0000 mg | ORAL_TABLET | Freq: Two times a day (BID) | ORAL | Status: DC

## 2018-11-02 MED ORDER — METHOCARBAMOL 500 MG IVPB - SIMPLE MED
500.0000 mg | Freq: Four times a day (QID) | INTRAVENOUS | Status: DC | PRN
  Filled 2018-11-02: qty 50

## 2018-11-02 MED ORDER — SODIUM CHLORIDE 0.9 % IR SOLN
Status: DC | PRN
  Administered 2018-11-02: 1000 mL

## 2018-11-02 MED ORDER — BUPIVACAINE-EPINEPHRINE (PF) 0.25% -1:200000 IJ SOLN
INTRAMUSCULAR | Status: AC
  Filled 2018-11-02: qty 30

## 2018-11-02 MED ORDER — POVIDONE-IODINE 10 % EX SWAB
2.0000 "application " | Freq: Once | CUTANEOUS | Status: AC
  Administered 2018-11-02: 2 via TOPICAL

## 2018-11-02 MED ORDER — MENTHOL 3 MG MT LOZG: 1.0000 | LOZENGE | OROMUCOSAL | Status: DC | PRN

## 2018-11-02 MED ORDER — POTASSIUM CHLORIDE CRYS ER 20 MEQ PO TBCR
20.0000 meq | EXTENDED_RELEASE_TABLET | Freq: Every day | ORAL | Status: DC
  Administered 2018-11-02 – 2018-11-03 (×2): 20 meq via ORAL
  Filled 2018-11-02 (×2): qty 1

## 2018-11-02 MED ORDER — LORAZEPAM 1 MG PO TABS: 0.0000 mg | ORAL_TABLET | Freq: Four times a day (QID) | ORAL | Status: DC

## 2018-11-02 MED ORDER — INSULIN ASPART 100 UNIT/ML ~~LOC~~ SOLN
0.0000 [IU] | Freq: Three times a day (TID) | SUBCUTANEOUS | Status: DC
  Administered 2018-11-02: 3 [IU] via SUBCUTANEOUS
  Administered 2018-11-02: 8 [IU] via SUBCUTANEOUS
  Administered 2018-11-03: 2 [IU] via SUBCUTANEOUS

## 2018-11-02 MED ORDER — ROPIVACAINE HCL 7.5 MG/ML IJ SOLN: INTRAMUSCULAR | Status: DC | PRN

## 2018-11-02 MED ORDER — KETOROLAC TROMETHAMINE 30 MG/ML IJ SOLN
INTRAMUSCULAR | Status: AC
  Filled 2018-11-02: qty 1

## 2018-11-02 MED ORDER — LIDOCAINE HCL (CARDIAC) PF 100 MG/5ML IV SOSY
PREFILLED_SYRINGE | INTRAVENOUS | Status: DC | PRN
  Administered 2018-11-02: 60 mg via INTRAVENOUS

## 2018-11-02 MED ORDER — ONDANSETRON HCL 4 MG/2ML IJ SOLN
INTRAMUSCULAR | Status: DC | PRN
  Administered 2018-11-02: 4 mg via INTRAVENOUS

## 2018-11-02 MED ORDER — MORPHINE SULFATE (PF) 2 MG/ML IV SOLN: 0.5000 mg | INTRAVENOUS | Status: DC | PRN

## 2018-11-02 MED ORDER — TRANEXAMIC ACID-NACL 1000-0.7 MG/100ML-% IV SOLN
1000.0000 mg | INTRAVENOUS | Status: AC
  Administered 2018-11-02: 1000 mg via INTRAVENOUS
  Filled 2018-11-02: qty 100

## 2018-11-02 MED ORDER — FENTANYL CITRATE (PF) 100 MCG/2ML IJ SOLN
INTRAMUSCULAR | Status: AC
  Filled 2018-11-02: qty 2

## 2018-11-02 MED ORDER — ALUM & MAG HYDROXIDE-SIMETH 200-200-20 MG/5ML PO SUSP: 30.0000 mL | ORAL | Status: DC | PRN

## 2018-11-02 MED ORDER — FENTANYL CITRATE (PF) 100 MCG/2ML IJ SOLN
INTRAMUSCULAR | Status: DC | PRN
  Administered 2018-11-02: 100 ug via INTRAVENOUS

## 2018-11-02 MED ORDER — SODIUM CHLORIDE 0.9 % IV SOLN
INTRAVENOUS | Status: DC
  Administered 2018-11-02: 06:00:00 via INTRAVENOUS

## 2018-11-02 MED ORDER — DOCUSATE SODIUM 100 MG PO CAPS
100.0000 mg | ORAL_CAPSULE | Freq: Two times a day (BID) | ORAL | Status: DC
  Administered 2018-11-02 – 2018-11-03 (×2): 100 mg via ORAL
  Filled 2018-11-02 (×2): qty 1

## 2018-11-02 MED ORDER — SODIUM CHLORIDE 0.9 % IR SOLN
Status: DC | PRN
  Administered 2018-11-02: 3000 mL

## 2018-11-02 MED ORDER — CEFAZOLIN SODIUM-DEXTROSE 2-4 GM/100ML-% IV SOLN
2.0000 g | Freq: Four times a day (QID) | INTRAVENOUS | Status: AC
  Administered 2018-11-02 (×2): 2 g via INTRAVENOUS
  Filled 2018-11-02 (×2): qty 100

## 2018-11-02 MED ORDER — ASPIRIN 81 MG PO CHEW
81.0000 mg | CHEWABLE_TABLET | Freq: Two times a day (BID) | ORAL | Status: DC
  Administered 2018-11-02 – 2018-11-03 (×2): 81 mg via ORAL
  Filled 2018-11-02 (×2): qty 1

## 2018-11-02 MED ORDER — METOPROLOL TARTRATE 50 MG PO TABS
100.0000 mg | ORAL_TABLET | Freq: Once | ORAL | Status: AC
  Administered 2018-11-02: 100 mg via ORAL
  Filled 2018-11-02: qty 2

## 2018-11-02 MED ORDER — ONDANSETRON HCL 4 MG PO TABS: 4.0000 mg | ORAL_TABLET | Freq: Four times a day (QID) | ORAL | Status: DC | PRN

## 2018-11-02 MED ORDER — PHENOL 1.4 % MT LIQD: 1.0000 | OROMUCOSAL | Status: DC | PRN

## 2018-11-02 MED ORDER — SODIUM CHLORIDE (PF) 0.9 % IJ SOLN
INTRAMUSCULAR | Status: AC
  Filled 2018-11-02: qty 50

## 2018-11-02 MED ORDER — FUROSEMIDE 40 MG PO TABS
40.0000 mg | ORAL_TABLET | Freq: Every day | ORAL | Status: DC
  Administered 2018-11-03: 40 mg via ORAL
  Filled 2018-11-02 (×2): qty 1

## 2018-11-02 MED ORDER — POLYETHYLENE GLYCOL 3350 17 G PO PACK: 17.0000 g | PACK | Freq: Every day | ORAL | Status: DC | PRN

## 2018-11-02 MED ORDER — METOCLOPRAMIDE HCL 5 MG/ML IJ SOLN: 5.0000 mg | Freq: Three times a day (TID) | INTRAMUSCULAR | Status: DC | PRN

## 2018-11-02 MED ORDER — PROPOFOL 10 MG/ML IV BOLUS
INTRAVENOUS | Status: AC
  Filled 2018-11-02: qty 80

## 2018-11-02 MED ORDER — ONDANSETRON HCL 4 MG/2ML IJ SOLN: 4.0000 mg | Freq: Once | INTRAMUSCULAR | Status: DC | PRN

## 2018-11-02 MED ORDER — ADULT MULTIVITAMIN W/MINERALS CH
1.0000 | ORAL_TABLET | Freq: Every day | ORAL | Status: DC
  Administered 2018-11-02 – 2018-11-03 (×2): 1 via ORAL
  Filled 2018-11-02 (×2): qty 1

## 2018-11-02 MED ORDER — ONDANSETRON HCL 4 MG/2ML IJ SOLN: 4.0000 mg | Freq: Four times a day (QID) | INTRAMUSCULAR | Status: DC | PRN

## 2018-11-02 MED ORDER — DULOXETINE HCL 20 MG PO CPEP
20.0000 mg | ORAL_CAPSULE | Freq: Every day | ORAL | Status: DC
  Administered 2018-11-02 – 2018-11-03 (×2): 20 mg via ORAL
  Filled 2018-11-02 (×2): qty 1

## 2018-11-02 MED ORDER — SODIUM CHLORIDE 0.9 % IV SOLN
INTRAVENOUS | Status: DC | PRN
  Administered 2018-11-02: 50 ug/min via INTRAVENOUS

## 2018-11-02 MED ORDER — DEXAMETHASONE SODIUM PHOSPHATE 10 MG/ML IJ SOLN
10.0000 mg | Freq: Once | INTRAMUSCULAR | Status: AC
  Administered 2018-11-03: 10 mg via INTRAVENOUS
  Filled 2018-11-02: qty 1

## 2018-11-02 MED ORDER — SENNA 8.6 MG PO TABS
1.0000 | ORAL_TABLET | Freq: Two times a day (BID) | ORAL | Status: DC
  Administered 2018-11-03: 8.6 mg via ORAL
  Filled 2018-11-02: qty 1

## 2018-11-02 MED ORDER — CALCIUM CARBONATE ANTACID 500 MG PO CHEW: 2.0000 | CHEWABLE_TABLET | Freq: Every day | ORAL | Status: DC | PRN

## 2018-11-02 MED ORDER — METOCLOPRAMIDE HCL 5 MG PO TABS: 5.0000 mg | ORAL_TABLET | Freq: Three times a day (TID) | ORAL | Status: DC | PRN

## 2018-11-02 MED ORDER — BUPIVACAINE IN DEXTROSE 0.75-8.25 % IT SOLN
INTRATHECAL | Status: DC | PRN
  Administered 2018-11-02: 2 mL via INTRATHECAL

## 2018-11-02 MED ORDER — ACETAMINOPHEN 10 MG/ML IV SOLN
1000.0000 mg | INTRAVENOUS | Status: AC
  Administered 2018-11-02: 1000 mg via INTRAVENOUS
  Filled 2018-11-02: qty 100

## 2018-11-02 MED ORDER — AMLODIPINE BESYLATE 5 MG PO TABS
5.0000 mg | ORAL_TABLET | Freq: Every day | ORAL | Status: DC
  Administered 2018-11-03: 5 mg via ORAL
  Filled 2018-11-02 (×2): qty 1

## 2018-11-02 MED ORDER — ENSURE ENLIVE PO LIQD: 237.0000 mL | Freq: Two times a day (BID) | ORAL | Status: DC

## 2018-11-02 MED ORDER — SODIUM CHLORIDE 0.9 % IV SOLN
INTRAVENOUS | Status: DC
  Administered 2018-11-02 – 2018-11-03 (×2): via INTRAVENOUS

## 2018-11-02 MED ORDER — HYDROCODONE-ACETAMINOPHEN 7.5-325 MG PO TABS
1.0000 | ORAL_TABLET | ORAL | Status: DC | PRN
  Administered 2018-11-02: 1 via ORAL
  Administered 2018-11-02 – 2018-11-03 (×4): 2 via ORAL
  Filled 2018-11-02 (×3): qty 2
  Filled 2018-11-02: qty 1
  Filled 2018-11-02: qty 2

## 2018-11-02 MED ORDER — LORAZEPAM 2 MG/ML IJ SOLN: 1.0000 mg | Freq: Four times a day (QID) | INTRAMUSCULAR | Status: DC | PRN

## 2018-11-02 MED ORDER — MIDAZOLAM HCL 5 MG/5ML IJ SOLN
INTRAMUSCULAR | Status: DC | PRN
  Administered 2018-11-02: 2 mg via INTRAVENOUS

## 2018-11-02 MED ORDER — METFORMIN HCL 500 MG PO TABS: 500.0000 mg | ORAL_TABLET | Freq: Two times a day (BID) | ORAL | Status: DC

## 2018-11-02 MED ORDER — METHOCARBAMOL 500 MG PO TABS
500.0000 mg | ORAL_TABLET | Freq: Four times a day (QID) | ORAL | Status: DC | PRN
  Administered 2018-11-02 – 2018-11-03 (×2): 500 mg via ORAL
  Filled 2018-11-02 (×2): qty 1

## 2018-11-02 MED ORDER — FENTANYL CITRATE (PF) 100 MCG/2ML IJ SOLN: 25.0000 ug | INTRAMUSCULAR | Status: DC | PRN

## 2018-11-02 MED ORDER — PROPOFOL 10 MG/ML IV BOLUS
INTRAVENOUS | Status: DC | PRN
  Administered 2018-11-02: 40 mg via INTRAVENOUS

## 2018-11-02 MED ORDER — FOLIC ACID 1 MG PO TABS
1.0000 mg | ORAL_TABLET | Freq: Every day | ORAL | Status: DC
  Administered 2018-11-02 – 2018-11-03 (×2): 1 mg via ORAL
  Filled 2018-11-02 (×2): qty 1

## 2018-11-02 MED ORDER — ACETAMINOPHEN 500 MG PO TABS: 1000.0000 mg | ORAL_TABLET | Freq: Once | ORAL | Status: DC

## 2018-11-02 MED ORDER — ACETAMINOPHEN 325 MG PO TABS: 325.0000 mg | ORAL_TABLET | Freq: Four times a day (QID) | ORAL | Status: DC | PRN

## 2018-11-02 MED ORDER — METOPROLOL TARTRATE 50 MG PO TABS
100.0000 mg | ORAL_TABLET | Freq: Two times a day (BID) | ORAL | Status: DC
  Administered 2018-11-02 – 2018-11-03 (×2): 100 mg via ORAL
  Filled 2018-11-02 (×3): qty 2

## 2018-11-02 MED ORDER — THIAMINE HCL 100 MG/ML IJ SOLN
100.0000 mg | Freq: Every day | INTRAMUSCULAR | Status: DC
  Filled 2018-11-02 (×2): qty 1

## 2018-11-02 MED ORDER — DIPHENHYDRAMINE HCL 12.5 MG/5ML PO ELIX: 12.5000 mg | ORAL_SOLUTION | ORAL | Status: DC | PRN

## 2018-11-02 SURGICAL SUPPLY — 52 items
BAG DECANTER FOR FLEXI CONT (MISCELLANEOUS) IMPLANT
BAG ZIPLOCK 12X15 (MISCELLANEOUS) IMPLANT
BLADE SURG SZ10 CARB STEEL (BLADE) ×4 IMPLANT
CHLORAPREP W/TINT 26ML (MISCELLANEOUS) ×2 IMPLANT
CLOTH BEACON ORANGE TIMEOUT ST (SAFETY) ×2 IMPLANT
COVER PERINEAL POST (MISCELLANEOUS) ×2 IMPLANT
COVER SURGICAL LIGHT HANDLE (MISCELLANEOUS) ×2 IMPLANT
COVER WAND RF STERILE (DRAPES) ×1 IMPLANT
CUP ACET PNNCL SECTR W/GRIP 56 (Hips) IMPLANT
DECANTER SPIKE VIAL GLASS SM (MISCELLANEOUS) ×2 IMPLANT
DERMABOND ADVANCED (GAUZE/BANDAGES/DRESSINGS) ×2
DERMABOND ADVANCED .7 DNX12 (GAUZE/BANDAGES/DRESSINGS) ×2 IMPLANT
DRAPE IMP U-DRAPE 54X76 (DRAPES) ×2 IMPLANT
DRAPE SHEET LG 3/4 BI-LAMINATE (DRAPES) ×6 IMPLANT
DRAPE STERI IOBAN 125X83 (DRAPES) ×2 IMPLANT
DRAPE U-SHAPE 47X51 STRL (DRAPES) ×4 IMPLANT
DRSG AQUACEL AG ADV 3.5X10 (GAUZE/BANDAGES/DRESSINGS) ×2 IMPLANT
ELECT PENCIL ROCKER SW 15FT (MISCELLANEOUS) ×2 IMPLANT
ELECT REM PT RETURN 15FT ADLT (MISCELLANEOUS) ×2 IMPLANT
GAUZE SPONGE 4X4 12PLY STRL (GAUZE/BANDAGES/DRESSINGS) ×2 IMPLANT
GLOVE BIO SURGEON STRL SZ8.5 (GLOVE) ×4 IMPLANT
GLOVE BIOGEL PI IND STRL 8.5 (GLOVE) ×1 IMPLANT
GLOVE BIOGEL PI INDICATOR 8.5 (GLOVE) ×1
GOWN SPEC L3 XXLG W/TWL (GOWN DISPOSABLE) ×2 IMPLANT
HANDPIECE INTERPULSE COAX TIP (DISPOSABLE) ×1
HEAD CERAMIC 36 PLUS5 (Hips) ×1 IMPLANT
HOLDER FOLEY CATH W/STRAP (MISCELLANEOUS) ×2 IMPLANT
HOOD PEEL AWAY FLYTE STAYCOOL (MISCELLANEOUS) ×8 IMPLANT
MARKER SKIN DUAL TIP RULER LAB (MISCELLANEOUS) ×2 IMPLANT
NDL SPNL 18GX3.5 QUINCKE PK (NEEDLE) ×1 IMPLANT
NEEDLE SPNL 18GX3.5 QUINCKE PK (NEEDLE) ×2 IMPLANT
PACK ANTERIOR HIP CUSTOM (KITS) ×2 IMPLANT
PINN SECTOR W/GRIP ACE CUP 56 (Hips) ×2 IMPLANT
PINNACLE ALTRX PLUS 4 N 36X56 (Hips) ×1 IMPLANT
SAW OSC TIP CART 19.5X105X1.3 (SAW) ×2 IMPLANT
SEALER BIPOLAR AQUA 6.0 (INSTRUMENTS) ×2 IMPLANT
SET HNDPC FAN SPRY TIP SCT (DISPOSABLE) ×1 IMPLANT
STEM TRI LOC BPS SZ7 W GRIPTON (Hips) IMPLANT
SUT ETHIBOND NAB CT1 #1 30IN (SUTURE) ×4 IMPLANT
SUT MNCRL AB 3-0 PS2 18 (SUTURE) ×2 IMPLANT
SUT MNCRL AB 4-0 PS2 18 (SUTURE) ×2 IMPLANT
SUT MON AB 2-0 CT1 36 (SUTURE) ×4 IMPLANT
SUT STRATAFIX PDO 1 14 VIOLET (SUTURE) ×1
SUT STRATFX PDO 1 14 VIOLET (SUTURE) ×1
SUT VIC AB 2-0 CT1 27 (SUTURE) ×1
SUT VIC AB 2-0 CT1 TAPERPNT 27 (SUTURE) ×1 IMPLANT
SUTURE STRATFX PDO 1 14 VIOLET (SUTURE) ×1 IMPLANT
SYR 50ML LL SCALE MARK (SYRINGE) ×2 IMPLANT
TRAY FOLEY MTR SLVR 16FR STAT (SET/KITS/TRAYS/PACK) ×1 IMPLANT
TRI LOC BPS SZ 7 W GRIPTON (Hips) ×2 IMPLANT
WATER STERILE IRR 1000ML POUR (IV SOLUTION) ×2 IMPLANT
YANKAUER SUCT BULB TIP 10FT TU (MISCELLANEOUS) ×2 IMPLANT

## 2018-11-02 NOTE — Discharge Instructions (Signed)
°Dr. Acea Yagi °Joint Replacement Specialist °[facility] °[address] °[place], [place] [address] °[phone] ° ° °TOTAL HIP REPLACEMENT POSTOPERATIVE DIRECTIONS ° ° ° °Hip Rehabilitation, Guidelines Following Surgery  ° °WEIGHT BEARING °Weight bearing as tolerated with assist device (walker, cane, etc) as directed, use it as long as suggested by your surgeon or therapist, typically at least 4-6 weeks. ° °The results of a hip operation are greatly improved after range of motion and muscle strengthening exercises. Follow all safety measures which are given to protect your hip. If any of these exercises cause increased pain or swelling in your joint, decrease the amount until you are comfortable again. Then slowly increase the exercises. Call your caregiver if you have problems or questions.  ° °HOME CARE INSTRUCTIONS  °Most of the following instructions are designed to prevent the dislocation of your new hip.  °Remove items at home which could result in a fall. This includes throw rugs or furniture in walking pathways.  °Continue medications as instructed at time of discharge. °· You may have some home medications which will be placed on hold until you complete the course of blood thinner medication. °· You may start showering once you are discharged home. Do not remove your dressing. °Do not put on socks or shoes without following the instructions of your caregivers.   °Sit on chairs with arms. Use the chair arms to help push yourself up when arising.  °Arrange for the use of a toilet seat elevator so you are not sitting low.  °· Walk with walker as instructed.  °You may resume a sexual relationship in one month or when given the OK by your caregiver.  °Use walker as long as suggested by your caregivers.  °You may put full weight on your legs and walk as much as is comfortable. °Avoid periods of inactivity such as sitting longer than an hour when not asleep. This helps prevent  blood clots.  °You may return to work once you are cleared by your surgeon.  °Do not drive a car for 6 weeks or until released by your surgeon.  °Do not drive while taking narcotics.  °Wear elastic stockings for two weeks following surgery during the day but you may remove then at night.  °Make sure you keep all of your appointments after your operation with all of your doctors and caregivers. You should call the office at the above phone number and make an appointment for approximately two weeks after the date of your surgery. °Please pick up a stool softener and laxative for home use as long as you are requiring pain medications. °· ICE to the affected hip every three hours for 30 minutes at a time and then as needed for pain and swelling. Continue to use ice on the hip for pain and swelling from surgery. You may notice swelling that will progress down to the foot and ankle.  This is normal after surgery.  Elevate the leg when you are not up walking on it.   °It is important for you to complete the blood thinner medication as prescribed by your doctor. °· Continue to use the breathing machine which will help keep your temperature down.  It is common for your temperature to cycle up and down following surgery, especially at night when you are not up moving around and exerting yourself.  The breathing machine keeps your lungs expanded and your temperature down. ° °RANGE OF MOTION AND STRENGTHENING EXERCISES  °These exercises are   designed to help you keep full movement of your hip joint. Follow your caregiver's or physical therapist's instructions. Perform all exercises about fifteen times, three times per day or as directed. Exercise both hips, even if you have had only one joint replacement. These exercises can be done on a training (exercise) mat, on the floor, on a table or on a bed. Use whatever works the best and is most comfortable for you. Use music or television while you are exercising so that the exercises  are a pleasant break in your day. This will make your life better with the exercises acting as a break in routine you can look forward to.  °Lying on your back, slowly slide your foot toward your buttocks, raising your knee up off the floor. Then slowly slide your foot back down until your leg is straight again.  °Lying on your back spread your legs as far apart as you can without causing discomfort.  °Lying on your side, raise your upper leg and foot straight up from the floor as far as is comfortable. Slowly lower the leg and repeat.  °Lying on your back, tighten up the muscle in the front of your thigh (quadriceps muscles). You can do this by keeping your leg straight and trying to raise your heel off the floor. This helps strengthen the largest muscle supporting your knee.  °Lying on your back, tighten up the muscles of your buttocks both with the legs straight and with the knee bent at a comfortable angle while keeping your heel on the floor.  ° °SKILLED REHAB INSTRUCTIONS: °If the patient is transferred to a skilled rehab facility following release from the hospital, a list of the current medications will be sent to the facility for the patient to continue.  When discharged from the skilled rehab facility, please have the facility set up the patient's Home Health Physical Therapy prior to being released. Also, the skilled facility will be responsible for providing the patient with their medications at time of release from the facility to include their pain medication and their blood thinner medication. If the patient is still at the rehab facility at time of the two week follow up appointment, the skilled rehab facility will also need to assist the patient in arranging follow up appointment in our office and any transportation needs. ° °MAKE SURE YOU:  °Understand these instructions.  °Will watch your condition.  °Will get help right away if you are not doing well or get worse. ° °Pick up stool softner and  laxative for home use following surgery while on pain medications. °Do not remove your dressing. °The dressing is waterproof--it is OK to take showers. °Continue to use ice for pain and swelling after surgery. °Do not use any lotions or creams on the incision until instructed by your surgeon. °Total Hip Protocol. ° ° °

## 2018-11-02 NOTE — Plan of Care (Signed)

## 2018-11-02 NOTE — Anesthesia Procedure Notes (Signed)
Spinal  Patient location during procedure: OR Start time: 11/02/2018 7:35 AM End time: 11/02/2018 7:45 AM Staffing Anesthesiologist: Leonides Grills, MD Performed: anesthesiologist  Preanesthetic Checklist Completed: patient identified, surgical consent, pre-op evaluation, timeout performed, IV checked, risks and benefits discussed and monitors and equipment checked Spinal Block Patient position: sitting Prep: DuraPrep Patient monitoring: cardiac monitor, continuous pulse ox and blood pressure Approach: midline Location: L4-5 Injection technique: single-shot Needle Needle type: Pencan  Needle gauge: 24 G Needle length: 9 cm Assessment Sensory level: T10 Additional Notes Functioning IV was confirmed and monitors were applied. Sterile prep and drape, including hand hygiene and sterile gloves were used. The patient was positioned and the spine was prepped. The skin was anesthetized with lidocaine.  Free flow of clear CSF was obtained prior to injecting local anesthetic into the CSF.  The spinal needle aspirated freely following injection.  The needle was carefully withdrawn.  The patient tolerated the procedure well.

## 2018-11-02 NOTE — Evaluation (Signed)
Physical Therapy Evaluation Patient Details Name: [name] MRN: [id number] DOB: 12-03-1960 Today's Date: 11/02/2018   History of Present Illness  58 yo male s/p L DA-THA on 11/02/18. Pt with R DA-THA in October 2019, with subsequent dislocation and closed reduction November 2019. PMH includes avascular necrosis of bilateral hips, LE edema, DMII with multiple toe amputations and neuropathy, alcohol dependence with fatty liver, marijuana abuse, obesity, bil TKR.  Clinical Impression   Pt presents with L hip pain, LE weakness, increased time and effort to perform mobility tasks, and decreased tolerance for ambulation due to antalgic gait. Pt to benefit from acute PT to address deficits. Pt ambulated 65 ft with RW with min guard assist, verbal cuing provided throughout. Pt educated on ankle pumps (20/hour) to perform this afternoon/evening to increase circulation, to pt's tolerance and limited by pain. PT to progress mobility as tolerated, and will continue to follow acutely.        Follow Up Recommendations Follow surgeon's recommendation for DC plan and follow-up therapies;Supervision for mobility/OOB(HEP; pt expressing interest in OPPT after a few weeks to "build muscles back up in my legs")    Equipment Recommendations  None recommended by PT    Recommendations for Other Services       Precautions / Restrictions Precautions Precautions: Fall Restrictions Weight Bearing Restrictions: No Other Position/Activity Restrictions: WBAT       Mobility  Bed Mobility Overal bed mobility: Needs Assistance Bed Mobility: Supine to Sit     Supine to sit: Min guard;HOB elevated     General bed mobility comments: Min guard for safety. Verbal cuing for sequencing to EOB, increased time.   Transfers Overall transfer level: Needs assistance Equipment used: Rolling walker (2 wheeled) Transfers: Sit to/from Stand Sit to Stand: Min guard;From elevated surface         General transfer  comment: Min guard for safety. Verbal cuing for hand placement.   Ambulation/Gait Ambulation/Gait assistance: Min guard;+2 safety/equipment(chair follow) Gait Distance (Feet): 65 Feet Assistive device: Rolling walker (2 wheeled) Gait Pattern/deviations: Step-to pattern;Decreased stance time - left;Decreased weight shift to left;Antalgic Gait velocity: decr    General Gait Details: Min guard for safety. Verbal cuing for placement in RW, turning, sequencing. Increasing antalgic gait with further distance walked.   Stairs            Wheelchair Mobility    Modified Rankin (Stroke Patients Only)       Balance Overall balance assessment: Mild deficits observed, not formally tested                                           Pertinent Vitals/Pain Pain Assessment: 0-10 Pain Score: 2  Pain Location: L hip  Pain Descriptors / Indicators: Aching Pain Intervention(s): Limited activity within patient's tolerance;Repositioned;Ice applied;Monitored during session    Home Living Family/patient expects to be discharged to:: Private residence Living Arrangements: Spouse/significant other Available Help at Discharge: Family;Available PRN/intermittently Type of Home: House Home Access: Stairs to enter Entrance Stairs-Rails: Doctor, general practice of Steps: 2 Home Layout: One level Home Equipment: Walker - 4 wheels;Crutches;Bedside commode;Walker - standard;Walker - 2 wheels      Prior Function Level of Independence: Needs assistance   Gait / Transfers Assistance Needed: used crutches to do steps   ADL's / Homemaking Assistance Needed: Pt was doing ADLs "for the most part", getting assist with socks and shoes  from girlfriend        Hand Dominance   Dominant Hand: Left    Extremity/Trunk Assessment   Upper Extremity Assessment Upper Extremity Assessment: Overall WFL for tasks assessed    Lower Extremity Assessment Lower Extremity Assessment:  Generalized weakness;LLE deficits/detail(Pt reports he has not been moving/ambulating much since R hip dislocation and reduction because MD told him to "take it easy") LLE Deficits / Details: suspected post-surgical hip weakness; able to perform ankle pumps, quad set, heel slide with assist     Cervical / Trunk Assessment Cervical / Trunk Assessment: Normal  Communication   Communication: No difficulties  Cognition Arousal/Alertness: Awake/alert Behavior During Therapy: WFL for tasks assessed/performed Overall Cognitive Status: Within Functional Limits for tasks assessed                                        General Comments      Exercises     Assessment/Plan    PT Assessment Patient needs continued PT services  PT Problem List Decreased strength;Pain;Decreased activity tolerance;Decreased knowledge of use of DME;Decreased balance;Decreased mobility       PT Treatment Interventions DME instruction;Therapeutic activities;Gait training;Therapeutic exercise;Patient/family education;Stair training;Balance training;Functional mobility training    PT Goals (Current goals can be found in the Care Plan section)  Acute Rehab PT Goals Patient Stated Goal: walk without pain  PT Goal Formulation: With patient Time For Goal Achievement: 11/09/18 Potential to Achieve Goals: Good    Frequency 7X/week   Barriers to discharge        Co-evaluation               AM-PAC PT "6 Clicks" Mobility  Outcome Measure Help needed turning from your back to your side while in a flat bed without using bedrails?: A Little Help needed moving from lying on your back to sitting on the side of a flat bed without using bedrails?: A Little Help needed moving to and from a bed to a chair (including a wheelchair)?: A Little Help needed standing up from a chair using your arms (e.g., wheelchair or bedside chair)?: A Little Help needed to walk in hospital room?: A Little Help needed  climbing 3-5 steps with a railing? : A Lot 6 Click Score: 17    End of Session Equipment Utilized During Treatment: Gait belt Activity Tolerance: Patient tolerated treatment well;Patient limited by pain Patient left: in chair;with chair alarm set;with call bell/phone within reach;with family/visitor present;with SCD's reapplied Nurse Communication: Other (comment)(RN in another pt's room ) PT Visit Diagnosis: Difficulty in walking, not elsewhere classified (R26.2);Other abnormalities of gait and mobility (R26.89)    Time: 2035-5974 PT Time Calculation (min) (ACUTE ONLY): 22 min   Charges:   PT Evaluation $PT Eval Low Complexity: 1 Low         Sakari Alkhatib Terrial Rhodes, PT Acute Rehabilitation Services Pager [phone]  Office [phone]   Areli Jowett D Despina Hidden 11/02/2018, 5:41 PM

## 2018-11-02 NOTE — Transfer of Care (Signed)
Immediate Anesthesia Transfer of Care Note  Patient: [name]  Procedure(s) Performed: TOTAL HIP ARTHROPLASTY ANTERIOR APPROACH (Left )  Patient Location: PACU  Anesthesia Type:Spinal  Level of Consciousness: awake, alert , oriented and patient cooperative  Airway & Oxygen Therapy: Patient Spontanous Breathing and Patient connected to face mask oxygen  Post-op Assessment: Report given to RN, Post -op Vital signs reviewed and stable and Patient moving all extremities  Post vital signs: Reviewed and stable  Last Vitals:  Vitals Value Taken Time  BP 99/73 11/02/2018 10:25 AM  Temp    Pulse 64 11/02/2018 10:30 AM  Resp 19 11/02/2018 10:30 AM  SpO2 100 % 11/02/2018 10:30 AM  Vitals shown include unvalidated device data.  Last Pain:  Vitals:   11/02/18 0621  TempSrc:   PainSc: 0-No pain      Patients Stated Pain Goal: 4 (11/02/18 [id number])  Complications: No apparent anesthesia complications

## 2018-11-02 NOTE — Interval H&P Note (Signed)
History and Physical Interval Note:  11/02/2018 7:12 AM  [name]  has presented today for surgery, with the diagnosis of Avascular necrosis left hip  The various methods of treatment have been discussed with the patient and family. After consideration of risks, benefits and other options for treatment, the patient has consented to  Procedure(s): TOTAL HIP ARTHROPLASTY ANTERIOR APPROACH (Left) as a surgical intervention .  The patient's history has been reviewed, patient examined, no change in status, stable for surgery.  I have reviewed the patient's chart and labs.  Questions were answered to the patient's satisfaction.     Iline Oven Taler Kushner

## 2018-11-02 NOTE — Anesthesia Postprocedure Evaluation (Signed)
Anesthesia Post Note  Patient: Duwan Blaney  Procedure(s) Performed: TOTAL HIP ARTHROPLASTY ANTERIOR APPROACH (Left )     Patient location during evaluation: PACU Anesthesia Type: Spinal Level of consciousness: oriented and awake and alert Pain management: pain level controlled Vital Signs Assessment: post-procedure vital signs reviewed and stable Respiratory status: spontaneous breathing, respiratory function stable and patient connected to nasal cannula oxygen Cardiovascular status: blood pressure returned to baseline and stable Postop Assessment: no headache, no backache, no apparent nausea or vomiting and spinal receding Anesthetic complications: no    Last Vitals:  Vitals:   11/02/18 1337 11/02/18 1432  BP: 124/73 (!) 145/90  Pulse: 68 72  Resp: 16   Temp: (!) 36.3 C (!) 36.3 C  SpO2: 99% 98%    Last Pain:  Vitals:   11/02/18 1432  TempSrc: Oral  PainSc:                  Ryan P Ellender

## 2018-11-02 NOTE — Op Note (Signed)
OPERATIVE REPORT  SURGEON: Rod Can, MD   ASSISTANT: Nehemiah Massed, PA-C.  PREOPERATIVE DIAGNOSIS: Left hip avascular necrosis.   POSTOPERATIVE DIAGNOSIS: Left hip avascular necrosis.   PROCEDURE: Left total hip arthroplasty, anterior approach.   IMPLANTS: DePuy Tri Lock stem, size 7, hi offset. DePuy Pinnacle Cup, size 56 mm. DePuy Altrx liner, size 36 by 56 mm, +4 neutral. DePuy Biolox ceramic head ball, size 36 + 5 mm.  ANESTHESIA:  MAC and Spinal  ESTIMATED BLOOD LOSS:-300 mL    ANTIBIOTICS: 3 g Ancef.  DRAINS: None.  COMPLICATIONS: None.   CONDITION: PACU - hemodynamically stable.   BRIEF CLINICAL NOTE: [name] is a 58 y.o. male with a long-standing history of Left hip arthritis. After failing conservative management, the patient was indicated for total hip arthroplasty. The risks, benefits, and alternatives to the procedure were explained, and the patient elected to proceed.  PROCEDURE IN DETAIL: Surgical site was marked by myself in the pre-op holding area. Once inside the operating room, spinal anesthesia was obtained, and a foley catheter was inserted. The patient was then positioned on the Hana table. All bony prominences were well padded. The hip was prepped and draped in the normal sterile surgical fashion. A time-out was called verifying side and site of surgery. The patient received IV antibiotics within 60 minutes of beginning the procedure.  The direct anterior approach to the hip was performed through the Hueter interval. Lateral femoral circumflex vessels were treated with the Auqumantys. The anterior capsule was exposed and an inverted T capsulotomy was made.The femoral neck cut was made to the level of the templated cut. A corkscrew was placed into the head and the head was removed. The femoral head was found to have eburnated bone. The head was passed to the back table and was measured.  Acetabular exposure was achieved, and the  pulvinar and labrum were excised. Sequential reaming of the acetabulum was then performed up to a size 55 mm reamer. A 56 mm cup was then opened and impacted into place at approximately 40 degrees of abduction and 20 degrees of anteversion. The final polyethylene liner was impacted into place and acetabular osteophytes were removed.   I then gained femoral exposure taking care to protect the abductors and greater trochanter. This was performed using standard external rotation, extension, and adduction. The capsule was peeled off the inner aspect of the greater trochanter, taking care to preserve the short external rotators. A cookie cutter was used to enter the femoral canal, and then the femoral canal finder was placed. Sequential broaching was performed up to a size 7. Calcar planer was used on the femoral neck remnant. I placed a hi offset neck and a trial head ball. The hip was reduced. Leg lengths and offset were checked fluoroscopically. The hip was dislocated and trial components were removed. The final implants were placed, and the hip was reduced.  Fluoroscopy was used to confirm component position and leg lengths. At 90 degrees of external rotation and full extension, the hip was stable to an anterior directed force.  The wound was copiously irrigated with normal saline using pulse lavage. Marcaine solution was injected into the periarticular soft tissue. The wound was closed in layers using #1 Vicryl and V-Loc for the fascia, 2-0 Vicryl for the subcutaneous fat, 2-0 Monocryl for the deep dermal layer, and staples plus Dermabond for the skin. Once the glue was fully dried, an Aquacell Ag dressing was applied. The patient was transported to the recovery  room in stable condition. Sponge, needle, and instrument counts were correct at the end of the case x2. The patient tolerated the procedure well and there were no known complications.  Please note that a surgical assistant was a  medical necessity for this procedure to perform it in a safe and expeditious manner. Assistant was necessary to provide appropriate retraction of vital neurovascular structures, to prevent femoral fracture, and to allow for anatomic placement of the prosthesis.

## 2018-11-03 ENCOUNTER — Encounter (HOSPITAL_COMMUNITY): Payer: Self-pay | Admitting: Orthopedic Surgery

## 2018-11-03 ENCOUNTER — Other Ambulatory Visit: Payer: Self-pay

## 2018-11-03 LAB — BASIC METABOLIC PANEL
Anion gap: 11 (ref 5–15)
BUN: 16 mg/dL (ref 6–20)
CALCIUM: 8.6 mg/dL — AB (ref 8.9–10.3)
CO2: 26 mmol/L (ref 22–32)
Chloride: 98 mmol/L (ref 98–111)
Creatinine, Ser: 1.28 mg/dL — ABNORMAL HIGH (ref 0.61–1.24)
GFR calc Af Amer: >60 mL/min (ref >60)
GFR calc non Af Amer: >60 mL/min (ref >60)
GLUCOSE: 154 mg/dL — AB (ref 70–99)
Potassium: 4.2 mmol/L (ref 3.5–5.1)
Sodium: 135 mmol/L (ref 135–145)

## 2018-11-03 LAB — CBC
HCT: 26.8 % — ABNORMAL LOW (ref 39.0–52.0)
Hemoglobin: 8.8 g/dL — ABNORMAL LOW (ref 13.0–17.0)
MCH: 34.5 pg — ABNORMAL HIGH (ref 26.0–34.0)
MCHC: 32.8 g/dL (ref 30.0–36.0)
MCV: 105.1 fL — ABNORMAL HIGH (ref 80.0–100.0)
Platelets: 179 10*3/uL (ref 150–400)
RBC: 2.55 MIL/uL — ABNORMAL LOW (ref 4.22–5.81)
RDW: 14 % (ref 11.5–15.5)
WBC: 7.4 10*3/uL (ref 4.0–10.5)
nRBC: 0 % (ref 0.0–0.2)

## 2018-11-03 LAB — GLUCOSE, CAPILLARY
GLUCOSE-CAPILLARY: 141 mg/dL — AB (ref 70–99)
Glucose-Capillary: 143 mg/dL — ABNORMAL HIGH (ref 70–99)

## 2018-11-03 MED ORDER — DOCUSATE SODIUM 100 MG PO CAPS: 100.0000 mg | ORAL_CAPSULE | Freq: Two times a day (BID) | ORAL | 1 refills | Status: DC

## 2018-11-03 MED ORDER — ONDANSETRON HCL 4 MG PO TABS: 4.0000 mg | ORAL_TABLET | Freq: Four times a day (QID) | ORAL | 0 refills | Status: DC | PRN

## 2018-11-03 MED ORDER — ASPIRIN 81 MG PO CHEW: 81.0000 mg | CHEWABLE_TABLET | Freq: Two times a day (BID) | ORAL | 1 refills | Status: DC

## 2018-11-03 MED ORDER — HYDROCODONE-ACETAMINOPHEN 5-325 MG PO TABS: 1.0000 | ORAL_TABLET | Freq: Four times a day (QID) | ORAL | 0 refills | Status: DC | PRN

## 2018-11-03 NOTE — Progress Notes (Signed)
    Subjective:  Patient reports pain as mild to moderate.  Denies N/V/CP/SOB. No c/o.  Objective:   VITALS:   Vitals:   11/02/18 1944 11/02/18 2114 11/03/18 0152 11/03/18 0553  BP: (!) 141/89 122/70 (!) 143/80 134/73  Pulse: 86 85 72 66  Resp:  16 14 15   Temp:  97.9 F (36.6 C) 97.7 F (36.5 C) 98 F (36.7 C)  TempSrc:  Oral Oral Oral  SpO2: 94% 97% 97% 97%  Weight:      Height:        NAD ABD soft Sensation intact distally Intact pulses distally Dorsiflexion/Plantar flexion intact Incision: dressing C/D/I Compartment soft   Lab Results  Component Value Date   WBC 7.4 11/03/2018   HGB 8.8 (L) 11/03/2018   HCT 26.8 (L) 11/03/2018   MCV 105.1 (H) 11/03/2018   PLT 179 11/03/2018   BMET    Component Value Date/Time   NA 135 11/03/2018 0342   K 4.2 11/03/2018 0342   CL 98 11/03/2018 0342   CO2 26 11/03/2018 0342   GLUCOSE 154 (H) 11/03/2018 0342   BUN 16 11/03/2018 0342   CREATININE 1.28 (H) 11/03/2018 0342   CALCIUM 8.6 (L) 11/03/2018 0342   GFRNONAA >60 11/03/2018 0342   GFRAA >60 11/03/2018 0342     Assessment/Plan: 1 Day Post-Op   Principal Problem:   Avascular necrosis of hip, left (HCC)   WBAT with walker DVT ppx: Aspirin, SCDs, TEDS PO pain control PT/OT Dispo: D/C home today with HEP   Iline Oven Kiela Shisler 11/03/2018, 7:45 AM   Samson Frederic, MD Cell: [phone] [facility] is now [facility]  [place] [address], [place], [place] [address] Phone: [phone] [email or web address] Facebook  [employer]

## 2018-11-03 NOTE — Care Management Note (Signed)
Case Management Note  Patient Details  Name: [name] MRN: [id number] Date of Birth: 23-Sep-1961  Subjective/Objective: AVN L hip. From home. D/c home w/HEP. No further CM needs.                   Action/Plan:d/c home.   Expected Discharge Date:  11/03/18               Expected Discharge Plan:  Home/Self Care  In-House Referral:     Discharge planning Services  CM Consult  Post Acute Care Choice:    Choice offered to:     DME Arranged:    DME Agency:     HH Arranged:    HH Agency:     Status of Service:  Completed, signed off  If discussed at [employer] of Stay Meetings, dates discussed:    Additional Comments:  Lanier Clam, RN 11/03/2018, 9:59 AM

## 2018-11-03 NOTE — Progress Notes (Signed)
Physical Therapy Treatment Patient Details Name: [name] MRN: [id number] DOB: 08-17-1961 Today's Date: 11/03/2018    History of Present Illness 58 yo male s/p L DA-THA on 11/02/18. Pt with R DA-THA in October 2019, with subsequent dislocation and closed reduction November 2019. PMH includes avascular necrosis of bilateral hips, LE edema, DMII with multiple toe amputations and neuropathy, alcohol dependence with fatty liver, marijuana abuse, obesity, bil TKR.    PT Comments    Pt progressing well with mobility and remains familiar with limitations and follow up home therex from recent R THR.  Pt reviewed car transfers, stairs and home therex program.  Pt states he has all handouts from R THR last October.   Follow Up Recommendations  Follow surgeon's recommendation for DC plan and follow-up therapies;Supervision for mobility/OOB     Equipment Recommendations  None recommended by PT    Recommendations for Other Services       Precautions / Restrictions Precautions Precautions: Fall Restrictions Weight Bearing Restrictions: No Other Position/Activity Restrictions: WBAT     Mobility  Bed Mobility Overal bed mobility: Needs Assistance Bed Mobility: Supine to Sit;Sit to Supine     Supine to sit: Min guard Sit to supine: Min guard   General bed mobility comments: Min guard for safety. Verbal cuing for sequencing to EOB, increased time.   Transfers Overall transfer level: Needs assistance Equipment used: Rolling walker (2 wheeled) Transfers: Sit to/from Stand Sit to Stand: Min guard;Supervision         General transfer comment: Min guard for safety. Verbal cuing for hand placement.   Ambulation/Gait Ambulation/Gait assistance: Min guard;Supervision Gait Distance (Feet): 140 Feet Assistive device: Rolling walker (2 wheeled) Gait Pattern/deviations: Decreased stance time - left;Decreased weight shift to left;Antalgic;Step-to pattern;Step-through pattern Gait  velocity: decr    General Gait Details: cues for posture, position from RW and initial sequence   Stairs Stairs: Yes Stairs assistance: Min guard Stair Management: One rail Right;Step to pattern;Forwards;With crutches Number of Stairs: 5 General stair comments: min cues for sequence and foot/crutch placement   Wheelchair Mobility    Modified Rankin (Stroke Patients Only)       Balance Overall balance assessment: Mild deficits observed, not formally tested                                          Cognition Arousal/Alertness: Awake/alert Behavior During Therapy: WFL for tasks assessed/performed Overall Cognitive Status: Within Functional Limits for tasks assessed                                        Exercises Total Joint Exercises Ankle Circles/Pumps: AROM;Both;20 reps;Supine Quad Sets: AROM;Both;10 reps;Supine Heel Slides: AAROM;Left;20 reps;Supine Hip ABduction/ADduction: AAROM;Left;15 reps;Supine Long Arc Quad: AROM;Left;10 reps;Seated    General Comments        Pertinent Vitals/Pain Pain Assessment: 0-10 Pain Score: 4  Pain Location: L hip  Pain Descriptors / Indicators: Aching Pain Intervention(s): Limited activity within patient's tolerance;Monitored during session;Premedicated before session;Ice applied    Home Living                      Prior Function            PT Goals (current goals can now be found in the care plan section) Acute  Rehab PT Goals Patient Stated Goal: walk without pain  PT Goal Formulation: With patient Time For Goal Achievement: 11/09/18 Potential to Achieve Goals: Good Progress towards PT goals: Progressing toward goals    Frequency    7X/week      PT Plan Current plan remains appropriate    Co-evaluation              AM-PAC PT "6 Clicks" Mobility   Outcome Measure  Help needed turning from your back to your side while in a flat bed without using bedrails?: A  Little Help needed moving from lying on your back to sitting on the side of a flat bed without using bedrails?: A Little Help needed moving to and from a bed to a chair (including a wheelchair)?: A Little Help needed standing up from a chair using your arms (e.g., wheelchair or bedside chair)?: A Little Help needed to walk in hospital room?: A Little Help needed climbing 3-5 steps with a railing? : A Little 6 Click Score: 18    End of Session Equipment Utilized During Treatment: Gait belt Activity Tolerance: Patient tolerated treatment well;Patient limited by pain Patient left: in bed;with call bell/phone within reach;with bed alarm set Nurse Communication: Mobility status PT Visit Diagnosis: Difficulty in walking, not elsewhere classified (R26.2);Other abnormalities of gait and mobility (R26.89)     Time: 9892-1194 PT Time Calculation (min) (ACUTE ONLY): 43 min  Charges:  $Gait Training: 8-22 mins $Therapeutic Exercise: 8-22 mins $Therapeutic Activity: 8-22 mins                     [name] PT Acute Rehabilitation Services Pager [phone] Office [phone]    [name] 11/03/2018, 12:58 PM

## 2018-11-03 NOTE — Plan of Care (Signed)
  Problem: Education: Goal: Knowledge of General Education information will improve Description: Including pain rating scale, medication(s)/side effects and non-pharmacologic comfort measures Outcome: Progressing   Problem: Health Behavior/Discharge Planning: Goal: Ability to manage health-related needs will improve Outcome: Progressing   Problem: Clinical Measurements: Goal: Ability to maintain clinical measurements within normal limits will improve Outcome: Progressing Goal: Will remain free from infection Outcome: Progressing Goal: Diagnostic test results will improve Outcome: Progressing Goal: Respiratory complications will improve Outcome: Progressing Goal: Cardiovascular complication will be avoided Outcome: Progressing   Problem: Activity: Goal: Risk for activity intolerance will decrease Outcome: Progressing   Problem: Nutrition: Goal: Adequate nutrition will be maintained Outcome: Progressing   Problem: Coping: Goal: Level of anxiety will decrease Outcome: Progressing   Problem: Elimination: Goal: Will not experience complications related to bowel motility Outcome: Progressing Goal: Will not experience complications related to urinary retention Outcome: Progressing   Problem: Pain Managment: Goal: General experience of comfort will improve Outcome: Progressing   Problem: Safety: Goal: Ability to remain free from injury will improve Outcome: Progressing   Problem: Skin Integrity: Goal: Risk for impaired skin integrity will decrease Outcome: Progressing   Problem: Education: Goal: Knowledge of the prescribed therapeutic regimen will improve Outcome: Progressing Goal: Understanding of discharge needs will improve Outcome: Progressing Goal: Individualized Educational Video(s) Outcome: Progressing   Problem: Activity: Goal: Ability to avoid complications of mobility impairment will improve Outcome: Progressing Goal: Ability to tolerate increased  activity will improve Outcome: Progressing   Problem: Clinical Measurements: Goal: Postoperative complications will be avoided or minimized Outcome: Progressing   Problem: Pain Management: Goal: Pain level will decrease with appropriate interventions Outcome: Progressing   Problem: Skin Integrity: Goal: Will show signs of wound healing Outcome: Progressing   

## 2018-11-03 NOTE — Discharge Summary (Signed)
Physician Discharge Summary  Patient ID: [name] MRN: [id number] DOB/AGE: December 13, 1960 58 y.o.  Admit date: 11/02/2018 Discharge date: 11/03/2018  Admission Diagnoses:  Avascular necrosis of hip, left [facility])  Discharge Diagnoses:  Principal Problem:   Avascular necrosis of hip, left [facility])   Past Medical History:  Diagnosis Date  . Alcohol dependence (HCC)   . Arthritis    hips, hands  . Bilateral carpal tunnel syndrome   . Bilateral leg edema    Chronic  . Diabetes mellitus without complication (HCC)    type 2  . Diverticulitis    portion of colon removed  . DOE (dyspnea on exertion)    occ  . Elevated liver enzymes   . Fatty liver   . GERD (gastroesophageal reflux disease)    occ  . Hammer toe   . Hip pain   . History of ventral hernia repair 2016   x2  . Hyperlipidemia    pt unsure  . Hypertension   . Marijuana abuse   . Morbid obesity (HCC)   . Neuromuscular disorder (HCC)    peripheral neuropathy feet and few fingers  . OSA (obstructive sleep apnea)    has OSA-not used CPAP 2-3 yrs could not tolerate cpap  . PONV (postoperative nausea and vomiting)   . Toe ulcer (HCC)    left healed    Surgeries: Procedure(s): TOTAL HIP ARTHROPLASTY ANTERIOR APPROACH on 11/02/2018   Consultants (if any):   Discharged Condition: Improved  Hospital Course: [name] is an 58 y.o. male who was admitted 11/02/2018 with a diagnosis of Avascular necrosis of hip, left (HCC) and went to the operating room on 11/02/2018 and underwent the above named procedures.    He was given perioperative antibiotics:  Anti-infectives (From admission, onward)   Start     Dose/Rate Route Frequency Ordered Stop   11/02/18 1400  ceFAZolin (ANCEF) IVPB 2g/100 mL premix     2 g 200 mL/hr over 30 Minutes Intravenous Every 6 hours 11/02/18 1154 11/02/18 2154   11/02/18 0600  ceFAZolin (ANCEF) 3 g in dextrose 5 % 50 mL IVPB  Status:  Discontinued     3 g 100 mL/hr over 30 Minutes Intravenous  On call to O.R. 11/01/18 0272 11/01/18 0729   11/02/18 0600  ceFAZolin (ANCEF) 3 g in dextrose 5 % 50 mL IVPB     3 g 100 mL/hr over 30 Minutes Intravenous On call to O.R. 11/01/18 5366 11/02/18 0752   11/01/18 0730  ceFAZolin (ANCEF) 3 g in dextrose 5 % 50 mL IVPB  Status:  Discontinued     3 g 100 mL/hr over 30 Minutes Intravenous On call to O.R. 11/01/18 4403 11/01/18 4742    .  He was given sequential compression devices, early ambulation, and ASA for DVT prophylaxis.  He benefited maximally from the hospital stay and there were no complications.    Recent vital signs:  Vitals:   11/03/18 0553 11/03/18 0922  BP: 134/73 120/73  Pulse: 66 74  Resp: 15 18  Temp: 98 F (36.7 C) 98 F (36.7 C)  SpO2: 97% 92%    Recent laboratory studies:  Lab Results  Component Value Date   HGB 8.8 (L) 11/03/2018   HGB 10.9 (L) 11/02/2018   HGB 11.9 (L) 10/27/2018   Lab Results  Component Value Date   WBC 7.4 11/03/2018   PLT 179 11/03/2018   No results found for: INR Lab Results  Component Value Date   NA 135  11/03/2018   K 4.2 11/03/2018   CL 98 11/03/2018   CO2 26 11/03/2018   BUN 16 11/03/2018   CREATININE 1.28 (H) 11/03/2018   GLUCOSE 154 (H) 11/03/2018    Discharge Medications:   Allergies as of 11/03/2018      Reactions   Claritin [loratadine] Shortness Of Breath, Anxiety      Medication List    STOP taking these medications   traMADol 50 MG tablet Commonly known as:  ULTRAM   TYLENOL PO     TAKE these medications   amLODipine 5 MG tablet Commonly known as:  NORVASC Take 1 tablet (5 mg total) by mouth daily.   aspirin 81 MG chewable tablet Chew 1 tablet (81 mg total) by mouth 2 (two) times daily.   calcium carbonate 500 MG chewable tablet Commonly known as:  TUMS - dosed in mg elemental calcium Chew 2 tablets by mouth daily as needed for indigestion or heartburn.   diphenhydrAMINE 25 MG tablet Commonly known as:  BENADRYL Take 25 mg by mouth at  bedtime as needed for allergies or sleep.   docusate sodium 100 MG capsule Commonly known as:  COLACE Take 1 capsule (100 mg total) by mouth 2 (two) times daily.   DULoxetine 20 MG capsule Commonly known as:  CYMBALTA Take 1 capsule (20 mg total) by mouth daily.   folic acid 1 MG tablet Commonly known as:  FOLVITE Take 1 tablet (1 mg total) by mouth daily.   furosemide 20 MG tablet Commonly known as:  LASIX Take 2 tablets (40 mg total) by mouth daily.   HYDROcodone-acetaminophen 5-325 MG tablet Commonly known as:  NORCO/VICODIN Take 1-2 tablets by mouth every 6 (six) hours as needed for moderate pain (pain score 4-6).   metFORMIN 500 MG tablet Commonly known as:  GLUCOPHAGE Take 1 tablet (500 mg total) by mouth 2 (two) times daily with a meal.   metoprolol tartrate 100 MG tablet Commonly known as:  LOPRESSOR TAKE 1 TABLET (100 MG TOTAL) BY MOUTH 2 (TWO) TIMES DAILY.   multivitamin with minerals Tabs tablet Take 1 tablet by mouth daily.   ondansetron 4 MG tablet Commonly known as:  ZOFRAN Take 1 tablet (4 mg total) by mouth every 6 (six) hours as needed for nausea.   potassium chloride SA 20 MEQ tablet Commonly known as:  K-DUR,KLOR-CON Take 1 tablet (20 mEq total) by mouth daily.   senna 8.6 MG Tabs tablet Commonly known as:  SENOKOT Take 1 tablet (8.6 mg total) by mouth 2 (two) times daily.   thiamine 100 MG tablet Take 1 tablet (100 mg total) by mouth daily.       Diagnostic Studies: Dg Pelvis Portable  Result Date: 11/02/2018 CLINICAL DATA:  Postop left hip replacement EXAM: PORTABLE PELVIS 1-2 VIEWS COMPARISON:  Pelvis radiography from earlier today FINDINGS: Located bilateral total hip arthroplasty. No evidence of periprosthetic fracture. IMPRESSION: Unremarkable bilateral total hip arthroplasty. Electronically Signed   By: Marnee SpringJonathon  Watts M.D.   On: 11/02/2018 11:08   Dg C-arm 1-60 Min-no Report  Result Date: 11/02/2018 Fluoroscopy was utilized by the  requesting physician.  No radiographic interpretation.   Dg Hip Operative Unilat With Pelvis Left  Result Date: 11/02/2018 CLINICAL DATA:  Left hip replacement EXAM: OPERATIVE LEFT HIP (WITH PELVIS IF PERFORMED) four VIEWS TECHNIQUE: Fluoroscopic spot image(s) were submitted for interpretation post-operatively. Fourteen seconds of fluoroscopic time COMPARISON:  None. FINDINGS: There is been interval placement of the left hip replacement without malalignment.  IMPRESSION: Interval placement left hip replacement without malalignment. Electronically Signed   By: Sherian ReinWei-Chen  Lin M.D.   On: 11/02/2018 09:49    Disposition:    Discharge Instructions    Call MD / Call 911   Complete by:  As directed    If you experience chest pain or shortness of breath, CALL 911 and be transported to the hospital emergency room.  If you develope a fever above 101 F, pus (white drainage) or increased drainage or redness at the wound, or calf pain, call your surgeon's office.   Constipation Prevention   Complete by:  As directed    Drink plenty of fluids.  Prune juice may be helpful.  You may use a stool softener, such as Colace (over the counter) 100 mg twice a day.  Use MiraLax (over the counter) for constipation as needed.   Diet - low sodium heart healthy   Complete by:  As directed    Driving restrictions   Complete by:  As directed    No driving for 6 weeks   Increase activity slowly as tolerated   Complete by:  As directed    Lifting restrictions   Complete by:  As directed    No lifting for 6 weeks   TED hose   Complete by:  As directed    Use stockings (TED hose) for 2 weeks on both leg(s).  You may remove them at night for sleeping.      Follow-up Information    Josalyn Dettmann, Arlys JohnBrian, MD. Schedule an appointment as soon as possible for a visit in 2 weeks.   Specialty:  Orthopedic Surgery Why:  For wound re-check Contact information: [address] [place]  [id number] [phone]            Signed: Iline OvenBrian J Issiac Jamar 11/04/2018, 9:41 PM

## 2018-11-17 DIAGNOSIS — Z471 Aftercare following joint replacement surgery: Secondary | ICD-10-CM | POA: Diagnosis not present

## 2018-11-17 DIAGNOSIS — Z96642 Presence of left artificial hip joint: Secondary | ICD-10-CM | POA: Diagnosis not present

## 2018-12-15 DIAGNOSIS — Z96642 Presence of left artificial hip joint: Secondary | ICD-10-CM | POA: Diagnosis not present

## 2018-12-15 DIAGNOSIS — Z471 Aftercare following joint replacement surgery: Secondary | ICD-10-CM | POA: Diagnosis not present

## 2018-12-25 ENCOUNTER — Other Ambulatory Visit: Payer: Self-pay | Admitting: Internal Medicine

## 2018-12-29 ENCOUNTER — Ambulatory Visit: Payer: BLUE CROSS/BLUE SHIELD | Admitting: Internal Medicine

## 2019-01-12 ENCOUNTER — Telehealth: Payer: Medicare Other | Admitting: Nurse Practitioner

## 2019-01-12 DIAGNOSIS — L03119 Cellulitis of unspecified part of limb: Secondary | ICD-10-CM | POA: Diagnosis not present

## 2019-01-12 MED ORDER — SULFAMETHOXAZOLE-TRIMETHOPRIM 800-160 MG PO TABS: 1.0000 | ORAL_TABLET | Freq: Two times a day (BID) | ORAL | 0 refills | Status: DC

## 2019-01-12 NOTE — Progress Notes (Signed)
E Visit for Cellulitis  We are sorry that you are not feeling well. Here is how we plan to help!  Based on what you shared with me it looks like you have cellulitis.  Cellulitis looks like areas of skin redness, swelling, and warmth; it develops as a result of bacteria entering under the skin. Little red spots and/or bleeding can be seen in skin, and tiny surface sacs containing fluid can occur. Fever can be present. Cellulitis is almost always on one side of a body, and the lower limbs are the most common site of involvement.   I have prescribed:  Bactrim DS 1 tablet by mouth BID for 7 days  HOME CARE:  . Take your medications as ordered and take all of them, even if the skin irritation appears to be healing.   GET HELP RIGHT AWAY IF:  . Symptoms that don't begin to go away within 48 hours. . Severe redness persists or worsens . If the area turns color, spreads or swells. . If it blisters and opens, develops yellow-brown crust or bleeds. . You develop a fever or chills. . If the pain increases or becomes unbearable.  . Are unable to keep fluids and food down.  MAKE SURE YOU    Understand these instructions.  Will watch your condition.  Will get help right away if you are not doing well or get worse.  Thank you for choosing an e-visit. Your e-visit answers were reviewed by a board certified advanced clinical practitioner to complete your personal care plan. Depending upon the condition, your plan could have included both over the counter or prescription medications. Please review your pharmacy choice. Make sure the pharmacy is open so you can pick up prescription now. If there is a problem, you may contact your provider through MyChart messaging and have the prescription routed to another pharmacy. Your safety is important to us. If you have drug allergies check your prescription carefully.  For the next 24 hours you can use MyChart to ask questions about today's visit, request a  non-urgent call back, or ask for a work or school excuse. You will get an email in the next two days asking about your experience. I hope that your e-visit has been valuable and will speed your recovery.  5 minutes spent reviewing and documenting in chart.  

## 2019-01-22 ENCOUNTER — Other Ambulatory Visit: Payer: Self-pay | Admitting: Internal Medicine

## 2019-01-23 ENCOUNTER — Other Ambulatory Visit: Payer: Self-pay

## 2019-01-24 ENCOUNTER — Telehealth: Payer: Self-pay | Admitting: Nurse Practitioner

## 2019-01-24 ENCOUNTER — Encounter: Payer: Self-pay | Admitting: Internal Medicine

## 2019-01-24 ENCOUNTER — Ambulatory Visit (INDEPENDENT_AMBULATORY_CARE_PROVIDER_SITE_OTHER): Payer: BLUE CROSS/BLUE SHIELD | Admitting: Internal Medicine

## 2019-01-24 DIAGNOSIS — L03119 Cellulitis of unspecified part of limb: Secondary | ICD-10-CM

## 2019-01-24 DIAGNOSIS — R600 Localized edema: Secondary | ICD-10-CM

## 2019-01-24 DIAGNOSIS — E1142 Type 2 diabetes mellitus with diabetic polyneuropathy: Secondary | ICD-10-CM | POA: Diagnosis not present

## 2019-01-24 DIAGNOSIS — L0390 Cellulitis, unspecified: Secondary | ICD-10-CM | POA: Insufficient documentation

## 2019-01-24 MED ORDER — DOXYCYCLINE HYCLATE 100 MG PO TABS: 100.0000 mg | ORAL_TABLET | Freq: Two times a day (BID) | ORAL | 0 refills | Status: DC

## 2019-01-24 NOTE — Progress Notes (Addendum)
Virtual Visit via Video Note  I connected with [name] on 01/24/19 at 12:00 PM EDT by a video enabled telemedicine application and verified that I am speaking with the correct person using two identifiers.   I discussed the limitations of evaluation and management by telemedicine and the availability of in person appointments. The patient expressed understanding and agreed to proceed.  The patient is currently at home and I am in the office.  His girlfriend is with him and helps provide some of the history.  No referring provider.    History of Present Illness: This is an acute visit for unresolved leg swelling/cellulitis.  He did an ED visit on 01/12/2019 and was diagnosed with cellulitis and prescribed Bactrim DS twice daily for 7 days.  His symptoms had started about 7 days prior.  After finishing the antibiotic he still had swelling and redness in the thighs and swelling and more significant redness in the calfs.  The Bactrim did seem to help.  The redness in the thighs is not consistent it tends to more come and go.  He does feel that his thighs are approximately one third larger than usual.  He initially had some weeping in the lower legs, but that has stopped after taking the Bactrim and they have scabbed over.Marland Kitchen.  He still has significant swelling in the calves.  He denies any fevers or chills.  He does have a dry cough, which he feels is from allergies.  He denies wheezing, shortness of breath, chest pain and palpitations.  He feels he has been compliant with a low-sodium diet.  He is sitting in a recliner and elevates his legs when sitting.  He states he is probably sitting 90% of the day and is not very active.  A couple of weeks ago he did decrease his furosemide from 40 mg daily to 20 mg daily.  He states he did this because he was concerned it was negatively affecting his kidney function.  Last night he did take 40 mg, but he does not think that the swelling changed much.  He  has not checked his sugars recently.   Social History   Socioeconomic History  . Marital status: Divorced    Spouse name: Not on file  . Number of children: Not on file  . Years of education: Not on file  . Highest education level: Not on file  Occupational History  . Occupation: unemployed, Press photographerfiling for disability  Social Needs  . Financial resource strain: Not on file  . Food insecurity:    Worry: Not on file    Inability: Not on file  . Transportation needs:    Medical: Not on file    Non-medical: Not on file  Tobacco Use  . Smoking status: Former Smoker    Packs/day: 0.25    Years: 10.00    Pack years: 2.50    Types: Cigarettes  . Smokeless tobacco: Never Used  . Tobacco comment: quit 2018  Substance and Sexual Activity  . Alcohol use: Yes    Comment: 2 liquor drinks /night  . Drug use: Yes    Frequency: 3.0 times per week    Types: Marijuana    Comment: couple times per week  . Sexual activity: Not on file  Lifestyle  . Physical activity:    Days per week: Not on file    Minutes per session: Not on file  . Stress: Not on file  Relationships  . Social connections:  Talks on phone: Not on file    Gets together: Not on file    Attends religious service: Not on file    Active member of club or organization: Not on file    Attends meetings of clubs or organizations: Not on file    Relationship status: Not on file  Other Topics Concern  . Not on file  Social History Narrative  . Not on file      Observations/Objective: Appears well-in no acute distress.  His girlfriend showed me his legs.  Her thumbprint did indent in his posterior mid left upper leg.  There is some mild erythema in his posterior upper legs.  His calves are swollen, there is a deeper shade of erythema, which she states does feel warm. There is scabbing skin and dry skin in the bilateral lower extremities.  There are a couple of open areas that are very difficult to distinguish via video, but  appear to be very shallow ulcers without discharge.   Assessment and Plan:  See Problem List for Assessment and Plan of chronic medical problems.   Follow Up Instructions:    I discussed the assessment and treatment plan with the patient. The patient was provided an opportunity to ask questions and all were answered. The patient agreed with the plan and demonstrated an understanding of the instructions.   The patient was advised to call back or seek an in-person evaluation if the symptoms worsen or if the condition fails to improve as anticipated.   He will update me early next week.  [name], MD

## 2019-01-24 NOTE — Assessment & Plan Note (Signed)
He does appear to have cellulitis-it is difficult to see clearly through the video, but this may involve both legs Discussed that part of his erythema is likely related to the increased swelling as well Discussed the importance of taking the antibiotic as well is increasing his furosemide dose back up to 40 mg daily Start doxycycline twice daily x10 days For the couple of areas that are open/shallow ulcers they can apply an antibacterial ointment and keep them covered  They will update me early next week, sooner if there is no improvement or any worsening

## 2019-01-24 NOTE — Assessment & Plan Note (Signed)
Increased bilateral lower extremity edema He has increased swelling in his legs This may correlate with him self decreasing his furosemide dose from 40 mg daily to 20 mg daily because of concern regarding his kidney function Advised that he needs 40 mg daily and he needs to take that every morning.  Ideally probably needs a higher dose, but he is not for that Continue low-sodium diet Continue elevating legs when sitting Encouraged him to move around more during the day He will let me know if the swelling does not improve

## 2019-01-24 NOTE — Telephone Encounter (Signed)
Patient will cintact PCP

## 2019-01-24 NOTE — Assessment & Plan Note (Signed)
Lab Results  Component Value Date   HGBA1C 5.6 10/27/2018   His A1c was 5.6% a couple of months ago and his sugars have been controlled, but stressed the importance of monitoring his sugars at home He will continue his current medications and start checking his sugars

## 2019-01-25 ENCOUNTER — Ambulatory Visit: Payer: BLUE CROSS/BLUE SHIELD | Admitting: Internal Medicine

## 2019-01-29 ENCOUNTER — Emergency Department (HOSPITAL_BASED_OUTPATIENT_CLINIC_OR_DEPARTMENT_OTHER): Payer: BLUE CROSS/BLUE SHIELD

## 2019-01-29 ENCOUNTER — Encounter (HOSPITAL_COMMUNITY): Payer: Self-pay | Admitting: Emergency Medicine

## 2019-01-29 ENCOUNTER — Inpatient Hospital Stay (HOSPITAL_COMMUNITY)
Admission: EM | Admit: 2019-01-29 | Discharge: 2019-02-02 | DRG: 292 | Disposition: A | Discharge status: Home / Self Care | Payer: BLUE CROSS/BLUE SHIELD | Attending: Internal Medicine | Admitting: Internal Medicine

## 2019-01-29 ENCOUNTER — Other Ambulatory Visit: Payer: Self-pay

## 2019-01-29 DIAGNOSIS — E876 Hypokalemia: Secondary | ICD-10-CM | POA: Diagnosis not present

## 2019-01-29 DIAGNOSIS — L03119 Cellulitis of unspecified part of limb: Secondary | ICD-10-CM

## 2019-01-29 DIAGNOSIS — E1169 Type 2 diabetes mellitus with other specified complication: Secondary | ICD-10-CM | POA: Diagnosis present

## 2019-01-29 DIAGNOSIS — E785 Hyperlipidemia, unspecified: Secondary | ICD-10-CM

## 2019-01-29 DIAGNOSIS — I10 Essential (primary) hypertension: Secondary | ICD-10-CM | POA: Diagnosis not present

## 2019-01-29 DIAGNOSIS — T461X6A Underdosing of calcium-channel blockers, initial encounter: Secondary | ICD-10-CM | POA: Diagnosis present

## 2019-01-29 DIAGNOSIS — I878 Other specified disorders of veins: Secondary | ICD-10-CM | POA: Diagnosis present

## 2019-01-29 DIAGNOSIS — K219 Gastro-esophageal reflux disease without esophagitis: Secondary | ICD-10-CM | POA: Diagnosis present

## 2019-01-29 DIAGNOSIS — L538 Other specified erythematous conditions: Secondary | ICD-10-CM | POA: Diagnosis not present

## 2019-01-29 DIAGNOSIS — R609 Edema, unspecified: Secondary | ICD-10-CM

## 2019-01-29 DIAGNOSIS — G4733 Obstructive sleep apnea (adult) (pediatric): Secondary | ICD-10-CM | POA: Diagnosis present

## 2019-01-29 DIAGNOSIS — Z79899 Other long term (current) drug therapy: Secondary | ICD-10-CM

## 2019-01-29 DIAGNOSIS — L97321 Non-pressure chronic ulcer of left ankle limited to breakdown of skin: Secondary | ICD-10-CM | POA: Diagnosis present

## 2019-01-29 DIAGNOSIS — T447X6A Underdosing of beta-adrenoreceptor antagonists, initial encounter: Secondary | ICD-10-CM | POA: Diagnosis present

## 2019-01-29 DIAGNOSIS — Z7289 Other problems related to lifestyle: Secondary | ICD-10-CM

## 2019-01-29 DIAGNOSIS — I5033 Acute on chronic diastolic (congestive) heart failure: Secondary | ICD-10-CM | POA: Diagnosis not present

## 2019-01-29 DIAGNOSIS — Z91128 Patient's intentional underdosing of medication regimen for other reason: Secondary | ICD-10-CM | POA: Diagnosis not present

## 2019-01-29 DIAGNOSIS — Z6836 Body mass index (BMI) 36.0-36.9, adult: Secondary | ICD-10-CM | POA: Diagnosis not present

## 2019-01-29 DIAGNOSIS — F329 Major depressive disorder, single episode, unspecified: Secondary | ICD-10-CM | POA: Diagnosis not present

## 2019-01-29 DIAGNOSIS — Z7982 Long term (current) use of aspirin: Secondary | ICD-10-CM

## 2019-01-29 DIAGNOSIS — R197 Diarrhea, unspecified: Secondary | ICD-10-CM | POA: Diagnosis not present

## 2019-01-29 DIAGNOSIS — E1142 Type 2 diabetes mellitus with diabetic polyneuropathy: Secondary | ICD-10-CM | POA: Diagnosis present

## 2019-01-29 DIAGNOSIS — L03116 Cellulitis of left lower limb: Secondary | ICD-10-CM | POA: Diagnosis not present

## 2019-01-29 DIAGNOSIS — Z8614 Personal history of Methicillin resistant Staphylococcus aureus infection: Secondary | ICD-10-CM | POA: Diagnosis not present

## 2019-01-29 DIAGNOSIS — Z8639 Personal history of other endocrine, nutritional and metabolic disease: Secondary | ICD-10-CM

## 2019-01-29 DIAGNOSIS — T383X6A Underdosing of insulin and oral hypoglycemic [antidiabetic] drugs, initial encounter: Secondary | ICD-10-CM | POA: Diagnosis not present

## 2019-01-29 DIAGNOSIS — E11622 Type 2 diabetes mellitus with other skin ulcer: Secondary | ICD-10-CM | POA: Diagnosis present

## 2019-01-29 DIAGNOSIS — F419 Anxiety disorder, unspecified: Secondary | ICD-10-CM | POA: Diagnosis present

## 2019-01-29 DIAGNOSIS — L97911 Non-pressure chronic ulcer of unspecified part of right lower leg limited to breakdown of skin: Secondary | ICD-10-CM | POA: Diagnosis present

## 2019-01-29 DIAGNOSIS — L98491 Non-pressure chronic ulcer of skin of other sites limited to breakdown of skin: Secondary | ICD-10-CM | POA: Diagnosis not present

## 2019-01-29 DIAGNOSIS — Z9049 Acquired absence of other specified parts of digestive tract: Secondary | ICD-10-CM

## 2019-01-29 DIAGNOSIS — Z96653 Presence of artificial knee joint, bilateral: Secondary | ICD-10-CM | POA: Diagnosis present

## 2019-01-29 DIAGNOSIS — R740 Nonspecific elevation of levels of transaminase and lactic acid dehydrogenase [LDH]: Secondary | ICD-10-CM | POA: Diagnosis present

## 2019-01-29 DIAGNOSIS — A419 Sepsis, unspecified organism: Secondary | ICD-10-CM

## 2019-01-29 DIAGNOSIS — I872 Venous insufficiency (chronic) (peripheral): Secondary | ICD-10-CM | POA: Diagnosis not present

## 2019-01-29 DIAGNOSIS — R231 Pallor: Secondary | ICD-10-CM | POA: Diagnosis present

## 2019-01-29 DIAGNOSIS — I110 Hypertensive heart disease with heart failure: Secondary | ICD-10-CM | POA: Diagnosis not present

## 2019-01-29 DIAGNOSIS — Z8719 Personal history of other diseases of the digestive system: Secondary | ICD-10-CM

## 2019-01-29 DIAGNOSIS — L0390 Cellulitis, unspecified: Secondary | ICD-10-CM | POA: Diagnosis present

## 2019-01-29 DIAGNOSIS — Z87891 Personal history of nicotine dependence: Secondary | ICD-10-CM

## 2019-01-29 DIAGNOSIS — Z7984 Long term (current) use of oral hypoglycemic drugs: Secondary | ICD-10-CM

## 2019-01-29 DIAGNOSIS — Z96643 Presence of artificial hip joint, bilateral: Secondary | ICD-10-CM | POA: Diagnosis present

## 2019-01-29 DIAGNOSIS — Z89422 Acquired absence of other left toe(s): Secondary | ICD-10-CM

## 2019-01-29 DIAGNOSIS — Z888 Allergy status to other drugs, medicaments and biological substances status: Secondary | ICD-10-CM

## 2019-01-29 LAB — BASIC METABOLIC PANEL
Anion gap: 18 — ABNORMAL HIGH (ref 5–15)
BUN: <5 mg/dL — ABNORMAL LOW (ref 6–20)
CO2: 21 mmol/L — ABNORMAL LOW (ref 22–32)
Calcium: 7.8 mg/dL — ABNORMAL LOW (ref 8.9–10.3)
Chloride: 96 mmol/L — ABNORMAL LOW (ref 98–111)
Creatinine, Ser: 0.76 mg/dL (ref 0.61–1.24)
GFR calc Af Amer: >60 mL/min (ref >60)
GFR calc non Af Amer: >60 mL/min (ref >60)
Glucose, Bld: 137 mg/dL — ABNORMAL HIGH (ref 70–99)
Potassium: 2.4 mmol/L — CL (ref 3.5–5.1)
Sodium: 135 mmol/L (ref 135–145)

## 2019-01-29 LAB — CBC WITH DIFFERENTIAL/PLATELET
Abs Immature Granulocytes: 0.07 10*3/uL (ref 0.00–0.07)
Basophils Absolute: 0.1 10*3/uL (ref 0.0–0.1)
Basophils Relative: 1 %
Eosinophils Absolute: 0 10*3/uL (ref 0.0–0.5)
Eosinophils Relative: 1 %
HCT: 31.6 % — ABNORMAL LOW (ref 39.0–52.0)
Hemoglobin: 10.8 g/dL — ABNORMAL LOW (ref 13.0–17.0)
Immature Granulocytes: 1 %
Lymphocytes Relative: 16 %
Lymphs Abs: 1.3 10*3/uL (ref 0.7–4.0)
MCH: 39.6 pg — ABNORMAL HIGH (ref 26.0–34.0)
MCHC: 34.2 g/dL (ref 30.0–36.0)
MCV: 115.8 fL — ABNORMAL HIGH (ref 80.0–100.0)
Monocytes Absolute: 0.9 10*3/uL (ref 0.1–1.0)
Monocytes Relative: 10 %
Neutro Abs: 5.8 10*3/uL (ref 1.7–7.7)
Neutrophils Relative %: 71 %
Platelets: 207 10*3/uL (ref 150–400)
RBC: 2.73 MIL/uL — ABNORMAL LOW (ref 4.22–5.81)
RDW: 16.3 % — ABNORMAL HIGH (ref 11.5–15.5)
WBC: 8.2 10*3/uL (ref 4.0–10.5)
nRBC: 0 % (ref 0.0–0.2)

## 2019-01-29 LAB — LACTIC ACID, PLASMA
Lactic Acid, Venous: 7.2 mmol/L (ref 0.5–1.9)
Lactic Acid, Venous: 7.5 mmol/L (ref 0.5–1.9)
Lactic Acid, Venous: 6.8 mmol/L (ref 0.5–1.9)

## 2019-01-29 MED ORDER — METOPROLOL TARTRATE 50 MG PO TABS
100.0000 mg | ORAL_TABLET | Freq: Two times a day (BID) | ORAL | Status: DC
  Administered 2019-01-29 – 2019-02-02 (×8): 100 mg via ORAL
  Filled 2019-01-29 (×8): qty 2

## 2019-01-29 MED ORDER — AMLODIPINE BESYLATE 5 MG PO TABS
5.0000 mg | ORAL_TABLET | Freq: Every day | ORAL | Status: DC
  Administered 2019-01-29: 5 mg via ORAL
  Filled 2019-01-29 (×2): qty 1

## 2019-01-29 MED ORDER — POTASSIUM CHLORIDE CRYS ER 20 MEQ PO TBCR
40.0000 meq | EXTENDED_RELEASE_TABLET | Freq: Once | ORAL | Status: AC
  Administered 2019-01-29: 40 meq via ORAL
  Filled 2019-01-29: qty 2

## 2019-01-29 MED ORDER — PIPERACILLIN-TAZOBACTAM 3.375 G IVPB 30 MIN
3.3750 g | Freq: Once | INTRAVENOUS | Status: AC
  Administered 2019-01-29: 3.375 g via INTRAVENOUS
  Filled 2019-01-29: qty 50

## 2019-01-29 MED ORDER — ADULT MULTIVITAMIN W/MINERALS CH
1.0000 | ORAL_TABLET | Freq: Every day | ORAL | Status: DC
  Administered 2019-01-29 – 2019-02-02 (×5): 1 via ORAL
  Filled 2019-01-29 (×5): qty 1

## 2019-01-29 MED ORDER — ONDANSETRON HCL 4 MG/2ML IJ SOLN: 4.0000 mg | Freq: Four times a day (QID) | INTRAMUSCULAR | Status: DC | PRN

## 2019-01-29 MED ORDER — ZOLPIDEM TARTRATE 5 MG PO TABS
5.0000 mg | ORAL_TABLET | Freq: Every evening | ORAL | Status: DC | PRN
  Administered 2019-01-29 – 2019-01-31 (×3): 5 mg via ORAL
  Filled 2019-01-29 (×3): qty 1

## 2019-01-29 MED ORDER — SODIUM CHLORIDE 0.9 % IV BOLUS (SEPSIS)
2000.0000 mL | Freq: Once | INTRAVENOUS | Status: AC
  Administered 2019-01-29: 2000 mL via INTRAVENOUS

## 2019-01-29 MED ORDER — SENNA 8.6 MG PO TABS
1.0000 | ORAL_TABLET | Freq: Two times a day (BID) | ORAL | Status: DC
  Administered 2019-01-30 – 2019-02-01 (×2): 8.6 mg via ORAL
  Filled 2019-01-29 (×6): qty 1

## 2019-01-29 MED ORDER — POTASSIUM CHLORIDE CRYS ER 20 MEQ PO TBCR
40.0000 meq | EXTENDED_RELEASE_TABLET | ORAL | Status: AC
  Administered 2019-01-29 – 2019-01-30 (×2): 40 meq via ORAL
  Filled 2019-01-29 (×2): qty 2

## 2019-01-29 MED ORDER — SODIUM CHLORIDE 0.9 % IV BOLUS (SEPSIS)
500.0000 mL | Freq: Once | INTRAVENOUS | Status: AC
  Administered 2019-01-29: 500 mL via INTRAVENOUS

## 2019-01-29 MED ORDER — VANCOMYCIN HCL IN DEXTROSE 1-5 GM/200ML-% IV SOLN: 1000.0000 mg | Freq: Once | INTRAVENOUS | Status: DC

## 2019-01-29 MED ORDER — VANCOMYCIN HCL 10 G IV SOLR
1500.0000 mg | Freq: Once | INTRAVENOUS | Status: AC
  Administered 2019-01-29: 1500 mg via INTRAVENOUS
  Filled 2019-01-29: qty 1500

## 2019-01-29 MED ORDER — FUROSEMIDE 10 MG/ML IJ SOLN
60.0000 mg | Freq: Two times a day (BID) | INTRAMUSCULAR | Status: DC
  Administered 2019-01-29 – 2019-02-02 (×8): 60 mg via INTRAVENOUS
  Filled 2019-01-29 (×8): qty 6

## 2019-01-29 MED ORDER — DULOXETINE HCL 20 MG PO CPEP
20.0000 mg | ORAL_CAPSULE | ORAL | Status: DC
  Administered 2019-01-29 – 2019-02-02 (×3): 20 mg via ORAL
  Filled 2019-01-29 (×3): qty 1

## 2019-01-29 MED ORDER — DOCUSATE SODIUM 100 MG PO CAPS: 100.0000 mg | ORAL_CAPSULE | Freq: Every day | ORAL | Status: DC | PRN

## 2019-01-29 MED ORDER — DIPHENHYDRAMINE HCL 25 MG PO CAPS
25.0000 mg | ORAL_CAPSULE | Freq: Every day | ORAL | Status: DC
  Administered 2019-01-29 – 2019-02-02 (×5): 25 mg via ORAL
  Filled 2019-01-29 (×5): qty 1

## 2019-01-29 MED ORDER — ASPIRIN 81 MG PO CHEW
81.0000 mg | CHEWABLE_TABLET | Freq: Two times a day (BID) | ORAL | Status: DC
  Administered 2019-01-29 – 2019-02-02 (×8): 81 mg via ORAL
  Filled 2019-01-29 (×8): qty 1

## 2019-01-29 MED ORDER — SODIUM CHLORIDE 0.9 % IV BOLUS (SEPSIS)
1800.0000 mL | Freq: Once | INTRAVENOUS | Status: AC
  Administered 2019-01-29: 1800 mL via INTRAVENOUS

## 2019-01-29 MED ORDER — SODIUM CHLORIDE 0.9 % IV BOLUS (SEPSIS): 1000.0000 mL | Freq: Once | INTRAVENOUS | Status: DC

## 2019-01-29 MED ORDER — ACETAMINOPHEN 325 MG PO TABS: 650.0000 mg | ORAL_TABLET | Freq: Four times a day (QID) | ORAL | Status: DC | PRN

## 2019-01-29 MED ORDER — ONDANSETRON HCL 4 MG PO TABS: 4.0000 mg | ORAL_TABLET | Freq: Four times a day (QID) | ORAL | Status: DC | PRN

## 2019-01-29 MED ORDER — VANCOMYCIN HCL 10 G IV SOLR
1500.0000 mg | Freq: Two times a day (BID) | INTRAVENOUS | Status: DC
  Administered 2019-01-30: 1500 mg via INTRAVENOUS
  Filled 2019-01-29: qty 1500

## 2019-01-29 MED ORDER — ENOXAPARIN SODIUM 60 MG/0.6ML ~~LOC~~ SOLN
60.0000 mg | SUBCUTANEOUS | Status: DC
  Administered 2019-01-29 – 2019-02-01 (×4): 60 mg via SUBCUTANEOUS
  Filled 2019-01-29 (×4): qty 0.6

## 2019-01-29 MED ORDER — ACETAMINOPHEN 650 MG RE SUPP: 650.0000 mg | Freq: Four times a day (QID) | RECTAL | Status: DC | PRN

## 2019-01-29 NOTE — ED Notes (Signed)
ED TO INPATIENT HANDOFF REPORT  Name/Age/Gender [name] 58 y.o. male  Code Status Code Status History    Date Active Date Inactive Code Status Order ID Comments User Context   11/02/2018 1154 11/03/2018 1749 Full Code [id number]  Samson Frederic, MD Inpatient   08/16/2018 1854 08/18/2018 1827 Full Code [id number]  Samson Frederic, MD Inpatient   02/17/2018 0227 02/20/2018 1717 Full Code [id number]  Jonah Blue, MD Inpatient      Home/SNF/Other Home  Chief Complaint Swelling in leg  Level of Care/Admitting Diagnosis ED Disposition    ED Disposition Condition Comment   Admit  Hospital Area: [facility] [[id number]]  Level of Care: Med-Surg [16]  Diagnosis: Cellulitis [[id number]]  Admitting Physician: Coralie Keens [[id number]]  Attending Physician: Coralie Keens [[id number]]  Estimated length of stay: 3 - 4 days  Certification:: I certify this patient will need inpatient services for at least 2 midnights  PT Class (Do Not Modify): Inpatient [101]  PT Acc Code (Do Not Modify): Private [1]       Medical History Past Medical History:  Diagnosis Date  . Alcohol dependence (HCC)   . Arthritis    hips, hands  . Bilateral carpal tunnel syndrome   . Bilateral leg edema    Chronic  . Diabetes mellitus without complication (HCC)    type 2  . Diverticulitis    portion of colon removed  . DOE (dyspnea on exertion)    occ  . Elevated liver enzymes   . Fatty liver   . GERD (gastroesophageal reflux disease)    occ  . Hammer toe   . Hip pain   . History of ventral hernia repair 2016   x2  . Hyperlipidemia    pt unsure  . Hypertension   . Marijuana abuse   . Morbid obesity (HCC)   . Neuromuscular disorder (HCC)    peripheral neuropathy feet and few fingers  . OSA (obstructive sleep apnea)    has OSA-not used CPAP 2-3 yrs could not tolerate cpap  . PONV (postoperative nausea and vomiting)   . Toe ulcer (HCC)    left healed     Allergies Allergies  Allergen Reactions  . Claritin [Loratadine] Shortness Of Breath and Anxiety    IV Location/Drains/Wounds Patient Lines/Drains/Airways Status   Active Line/Drains/Airways    Name:   Placement date:   Placement time:   Site:   Days:   Peripheral IV 01/29/19 Right Antecubital   01/29/19    1628    Antecubital   less than 1   Incision (Closed) 11/02/18 Hip Left   11/02/18    0938     88          Labs/Imaging Results for orders placed or performed during the hospital encounter of 01/29/19 (from the past 48 hour(s))  Lactic acid, plasma     Status: Abnormal   Collection Time: 01/29/19  4:14 PM  Result Value Ref Range   Lactic Acid, Venous 7.2 (HH) 0.5 - 1.9 mmol/L    Comment: CRITICAL RESULT CALLED TO, READ BACK BY AND VERIFIED WITH: JESSEE,B. RN  ON 04.13.2020 BY COHEN,K Performed at [facility], [address], [place], [place] [address]   CBC with Differential     Status: Abnormal   Collection Time: 01/29/19  4:14 PM  Result Value Ref Range   WBC 8.2 4.0 - 10.5 K/uL   RBC 2.73 (L) 4.22 - 5.81 MIL/uL   Hemoglobin 10.8 (L)  13.0 - 17.0 g/dL   HCT 64.3 (L) 83.8 - 18.4 %   MCV 115.8 (H) 80.0 - 100.0 fL   MCH 39.6 (H) 26.0 - 34.0 pg   MCHC 34.2 30.0 - 36.0 g/dL   RDW 03.7 (H) 54.3 - 60.6 %   Platelets 207 150 - 400 K/uL   nRBC 0.0 0.0 - 0.2 %   Neutrophils Relative % 71 %   Neutro Abs 5.8 1.7 - 7.7 K/uL   Lymphocytes Relative 16 %   Lymphs Abs 1.3 0.7 - 4.0 K/uL   Monocytes Relative 10 %   Monocytes Absolute 0.9 0.1 - 1.0 K/uL   Eosinophils Relative 1 %   Eosinophils Absolute 0.0 0.0 - 0.5 K/uL   Basophils Relative 1 %   Basophils Absolute 0.1 0.0 - 0.1 K/uL   RBC Morphology MORPHOLOGY UNREMARKABLE    Immature Granulocytes 1 %   Abs Immature Granulocytes 0.07 0.00 - 0.07 K/uL    Comment: Performed at [facility], [address], [place], [place] [address]  Basic metabolic panel     Status: Abnormal    Collection Time: 01/29/19  4:14 PM  Result Value Ref Range   Sodium 135 135 - 145 mmol/L   Potassium 2.4 (LL) 3.5 - 5.1 mmol/L    Comment: CRITICAL RESULT CALLED TO, READ BACK BY AND VERIFIED WITH: JESSEE,B. RN @1701  ON 04.13.2020 BY COHEN,K    Chloride 96 (L) 98 - 111 mmol/L   CO2 21 (L) 22 - 32 mmol/L   Glucose, Bld 137 (H) 70 - 99 mg/dL   BUN <5 (L) 6 - 20 mg/dL   Creatinine, Ser 0.35 0.61 - 1.24 mg/dL   Calcium 7.8 (L) 8.9 - 10.3 mg/dL   GFR calc non Af Amer >60 >60 mL/min   GFR calc Af Amer >60 >60 mL/min   Anion gap 18 (H) 5 - 15    Comment: Performed at [facility], [address] [place], [place] [address]   Vas [place] Lower Extremity Venous (dvt) (only [facility])  Result Date: 01/29/2019  Lower Venous Study Indications: Edema, Erythema, and Open wounds, weeping.  Limitations: Body habitus, open wound and edema, skin texture. Comparison Study: No prior LEV on file Performing Technologist: Sherren Kerns RVS  Examination Guidelines: A complete evaluation includes B-mode imaging, spectral Doppler, color Doppler, and power Doppler as needed of all accessible portions of each vessel. Bilateral testing is considered an integral part of a complete examination. Limited examinations for reoccurring indications may be performed as noted.  Right Venous Findings: +---------+---------------+---------+-----------+----------+--------------+          CompressibilityPhasicitySpontaneityPropertiesSummary        +---------+---------------+---------+-----------+----------+--------------+ CFV      Full                                                        +---------+---------------+---------+-----------+----------+--------------+ SFJ      Full                                                        +---------+---------------+---------+-----------+----------+--------------+ FV Prox  Full                                                         +---------+---------------+---------+-----------+----------+--------------+  FV Mid   Full                                                        +---------+---------------+---------+-----------+----------+--------------+ FV DistalFull                                                        +---------+---------------+---------+-----------+----------+--------------+ PFV      Full                                                        +---------+---------------+---------+-----------+----------+--------------+ POP      Full                                                        +---------+---------------+---------+-----------+----------+--------------+ PTV                                                   Not visualized +---------+---------------+---------+-----------+----------+--------------+ PERO                                                  Not visualized +---------+---------------+---------+-----------+----------+--------------+ GSV      Full                                                        +---------+---------------+---------+-----------+----------+--------------+  Right Technical Findings: Not visualized segments include Posterior tibial and peroneal veins.  Left Venous Findings: +---------+---------------+---------+-----------+----------+--------------+          CompressibilityPhasicitySpontaneityPropertiesSummary        +---------+---------------+---------+-----------+----------+--------------+ CFV      Full           Yes      Yes                                 +---------+---------------+---------+-----------+----------+--------------+ SFJ      Full                                                        +---------+---------------+---------+-----------+----------+--------------+ FV Prox  Full                                                        +---------+---------------+---------+-----------+----------+--------------+  FV  Mid                  Yes      Yes                  visualized     +---------+---------------+---------+-----------+----------+--------------+ FV Distal               Yes      Yes                  visualized     +---------+---------------+---------+-----------+----------+--------------+ PFV      Full                                                        +---------+---------------+---------+-----------+----------+--------------+ POP      Full                                                        +---------+---------------+---------+-----------+----------+--------------+ PTV                                                   Not visualized +---------+---------------+---------+-----------+----------+--------------+ PERO                                                  Not visualized +---------+---------------+---------+-----------+----------+--------------+ GSV      Full                                                        +---------+---------------+---------+-----------+----------+--------------+  Left Technical Findings: Not visualized segments include posterior tibial and peroneal veins.   Summary: Right: There is no evidence of deep vein thrombosis in the lower extremity. However, portions of this examination were limited- see technologist comments above. Left: There is no evidence of deep vein thrombosis in the lower extremity. However, portions of this examination were limited- see technologist comments above.  *See table(s) above for measurements and observations. Electronically signed by Sherald Hess MD on 01/29/2019 at 5:18:51 PM.    Final     Pending Labs Unresulted Labs (From admission, onward)    Start     Ordered   01/29/19 1550  Culture, blood (routine x 2)  BLOOD CULTURE X 2,   STAT     01/29/19 1550   01/29/19 1550  Lactic acid, plasma  Now then every 2 hours,   STAT     01/29/19 1550   Signed and Held  CBC  (enoxaparin (LOVENOX)    CrCl  >/= 30 ml/min)  Once,   R    Comments:  Baseline for enoxaparin therapy IF NOT ALREADY DRAWN.  Notify MD if PLT < 100 K.    Signed and Held   Signed and  Held  Creatinine, serum  (enoxaparin (LOVENOX)    CrCl >/= 30 ml/min)  Once,   R    Comments:  Baseline for enoxaparin therapy IF NOT ALREADY DRAWN.    Signed and Held   Signed and Held  Creatinine, serum  (enoxaparin (LOVENOX)    CrCl >/= 30 ml/min)  Weekly,   R    Comments:  while on enoxaparin therapy    Signed and Held   Signed and Held  Basic metabolic panel  Tomorrow morning,   R     Signed and Held   Signed and Held  CBC  Tomorrow morning,   R     Signed and Held          Vitals/Pain Today's Vitals   01/29/19 1540 01/29/19 1541 01/29/19 1542  BP:   (!) 144/67  Pulse:   84  Resp:   18  Temp:   97.9 F (36.6 C)  TempSrc:   Oral  SpO2:   98%  Weight:  280 lb (127 kg)   Height:  6\' 1"  (1.854 m)   PainSc: 3       Isolation Precautions No active isolations  Medications Medications  vancomycin (VANCOCIN) 1,500 mg in sodium chloride 0.9 % 500 mL IVPB (1,500 mg Intravenous New Bag/Given 01/29/19 1818)  piperacillin-tazobactam (ZOSYN) IVPB 3.375 g (0 g Intravenous Stopped 01/29/19 1806)  potassium chloride SA (K-DUR,KLOR-CON) CR tablet 40 mEq (40 mEq Oral Given 01/29/19 1725)  sodium chloride 0.9 % bolus 2,000 mL (2,000 mLs Intravenous New Bag/Given 01/29/19 1721)  sodium chloride 0.9 % bolus 1,800 mL (1,800 mLs Intravenous New Bag/Given 01/29/19 1809)    Mobility walks with person assist

## 2019-01-29 NOTE — ED Provider Notes (Addendum)
[facility]-EMERGENCY DEPT Provider Note   CSN: [id number] Arrival date & time: 01/29/19  1533    History   Chief Complaint Chief Complaint  Patient presents with   Leg Swelling   open wounds    HPI [name] is a 58 y.o. male.     58 year old obese male with past medical history of diabetes, hypertension, hyperlipidemia who presents emergency department bilateral lower extremity edema.  Patient reports that this is been going on for several months.  However, reports that the last 2 weeks or so he has had worsening symptoms.  Reports that he has been on a course of Bactrim followed by a course of doxycycline.  Reports that he is on about day 5 out of day 10 of doxycycline.  Reports that the redness in the lower extremities "comes and goes".  Denies any fevers or chills.  Reports a history of venous stasis.  Reports that he was supposed be on 40 mg of Lasix but cut himself down to 20 mg after he found out his kidney function was mildly elevated sometime back.  Reports that the last 5 days he has increased back up to 40 mg a day.  Patient reports that his father is a retired Development worker, communityphysician and was concerned for sepsis so he told him to come to the emergency department.  Patient admits to pharmacy staff that he has noncompliant with his medication and takes his diabetes medications every other day and his blood pressure medications every other day in attempt to protect his kidneys.     Past Medical History:  Diagnosis Date   Alcohol dependence (HCC)    Arthritis    hips, hands   Bilateral carpal tunnel syndrome    Bilateral leg edema    Chronic   Diabetes mellitus without complication (HCC)    type 2   Diverticulitis    portion of colon removed   DOE (dyspnea on exertion)    occ   Elevated liver enzymes    Fatty liver    GERD (gastroesophageal reflux disease)    occ   Hammer toe    Hip pain    History of ventral hernia repair 2016   x2    Hyperlipidemia    pt unsure   Hypertension    Marijuana abuse    Morbid obesity (HCC)    Neuromuscular disorder (HCC)    peripheral neuropathy feet and few fingers   OSA (obstructive sleep apnea)    has OSA-not used CPAP 2-3 yrs could not tolerate cpap   PONV (postoperative nausea and vomiting)    Toe ulcer (HCC)    left healed    Patient Active Problem List   Diagnosis Date Noted   Type 2 diabetes mellitus with hyperlipidemia (HCC) 01/29/2019   Cellulitis 01/24/2019   Avascular necrosis of hip, left (HCC) 11/02/2018   Decreased hearing of both ears 10/30/2018   Decreased GFR 10/30/2018   Avascular necrosis of hip, right (HCC) 08/16/2018   Bilateral leg edema 02/27/2018   Diabetic foot infection (HCC) 02/16/2018   Alcohol dependence syndrome (HCC) 02/16/2018   Marijuana abuse 02/16/2018   Diabetic ulcer of right midfoot associated with type 2 diabetes mellitus, with necrosis of muscle (HCC) 11/16/2017   Foot ulcer (HCC) 12/21/2016   Bilateral carpal tunnel syndrome 12/15/2016   Fatty liver 12/14/2016   Hyperlipidemia 11/25/2016   Elevated liver enzymes 11/25/2016   Diabetes (HCC) 11/25/2016   Hypertension 11/23/2016   Neuropathy 11/23/2016   Toe  amputation status, right 11/23/2016   Morbid obesity (HCC) 11/23/2016   OSA (obstructive sleep apnea) 11/23/2016   Other hammer toe (acquired) 11/11/2013   Exstrophy of bladder 11/11/2013    Past Surgical History:  Procedure Laterality Date   AMPUTATION TOE Left 05/25/2018   Procedure: AMPUTATION TOE left 3rd;  Surgeon: Toni Arthurs, MD;  Location: [facility];  Service: Orthopedics;  Laterality: Left;  , to follow   AMPUTATION TOE Left 06/28/2018   Procedure: Left foot revision 3rd toe amputation including 3rd metatarsal;  Surgeon: Toni Arthurs, MD;  Location:  SURGERY CENTER;  Service: Orthopedics;  Laterality: Left;    BICEPS TENDON REPAIR Left 2014   Partial    COLONOSCOPY     HERNIA REPAIR  2016   ventral   HIP CLOSED REDUCTION Right 09/01/2018   Procedure: CLOSED REDUCTION HIP;  Surgeon: Yolonda Kida, MD;  Location: [facility];  Service: Orthopedics;  Laterality: Right;   JOINT REPLACEMENT     b/l knees    TOE AMPUTATION Right 09/2013   TOTAL HIP ARTHROPLASTY Right 08/16/2018   Procedure: TOTAL HIP ARTHROPLASTY ANTERIOR APPROACH;  Surgeon: Samson Frederic, MD;  Location: [facility];  Service: Orthopedics;  Laterality: Right;   TOTAL HIP ARTHROPLASTY Left 11/02/2018   Procedure: TOTAL HIP ARTHROPLASTY ANTERIOR APPROACH;  Surgeon: Samson Frederic, MD;  Location: [facility];  Service: Orthopedics;  Laterality: Left;   TOTAL KNEE ARTHROPLASTY     bilat        Home Medications    Prior to Admission medications   Medication Sig Start Date End Date Taking? Authorizing Provider  amLODipine (NORVASC) 5 MG tablet Take 1 tablet (5 mg total) by mouth daily. 10/30/18  Yes Burns, Bobette Mo, MD  aspirin 81 MG chewable tablet Chew 1 tablet (81 mg total) by mouth 2 (two) times daily. 11/03/18  Yes Swinteck, Arlys John, MD  calcium carbonate (TUMS - DOSED IN MG ELEMENTAL CALCIUM) 500 MG chewable tablet Chew 2 tablets by mouth daily as needed for indigestion or heartburn.   Yes [provider]  diphenhydrAMINE (BENADRYL ALLERGY) 25 MG tablet Take 25 mg by mouth daily.   Yes [provider]  docusate sodium (COLACE) 100 MG capsule Take 1 capsule (100 mg total) by mouth 2 (two) times daily. Patient taking differently: Take 100 mg by mouth daily as needed for mild constipation.  11/03/18  Yes Swinteck, Arlys John, MD  Docusate Sodium 100 MG capsule Take 100 mg by mouth daily. 11/03/18  Yes [provider]  doxycycline (VIBRA-TABS) 100 MG tablet Take 1 tablet (100 mg total) by mouth 2 (two) times daily. 01/24/19  Yes Burns, Bobette Mo, MD  DULoxetine (CYMBALTA) 20 MG capsule Take 1 capsule (20 mg total) by mouth daily. Patient taking differently:  Take 20 mg by mouth every other day.  10/30/18  Yes Burns, Bobette Mo, MD  furosemide (LASIX) 20 MG tablet TAKE 2 TABLETS BY MOUTH EVERY DAY 12/25/18  Yes Burns, Bobette Mo, MD  KLOR-CON M20 20 MEQ tablet TAKE 1 TABLET BY MOUTH EVERY DAY 01/22/19  Yes Burns, Bobette Mo, MD  metFORMIN (GLUCOPHAGE) 500 MG tablet Take 1 tablet (500 mg total) by mouth 2 (two) times daily with a meal. 06/30/18  Yes Burns, Bobette Mo, MD  metoprolol tartrate (LOPRESSOR) 100 MG tablet TAKE 1 TABLET (100 MG TOTAL) BY MOUTH 2 (TWO) TIMES DAILY. Patient taking differently: Take 100 mg by mouth See admin instructions. "twice daily every other day" 03/20/18  Yes  [name], MD  Multiple Vitamin (MULTIVITAMIN WITH MINERALS) TABS tablet Take 1 tablet by mouth daily. 02/20/18  Yes Mikhail, Nita Sells, DO  senna (SENOKOT) 8.6 MG TABS tablet Take 1 tablet (8.6 mg total) by mouth 2 (two) times daily. 08/17/18  Yes Swinteck, Arlys John, MD  folic acid (FOLVITE) 1 MG tablet Take 1 tablet (1 mg total) by mouth daily. Patient not taking: Reported on 01/29/2019 02/20/18   Edsel Petrin, DO  ondansetron (ZOFRAN) 4 MG tablet Take 1 tablet (4 mg total) by mouth every 6 (six) hours as needed for nausea. Patient not taking: Reported on 01/29/2019 11/03/18   Samson Frederic, MD  thiamine 100 MG tablet Take 1 tablet (100 mg total) by mouth daily. Patient not taking: Reported on 01/29/2019 02/20/18   Edsel Petrin, DO    Family History Family History  Adopted: Yes  Family history unknown: Yes    Social History Social History   Tobacco Use   Smoking status: Former Smoker    Packs/day: 0.25    Years: 10.00    Pack years: 2.50    Types: Cigarettes   Smokeless tobacco: Never Used   Tobacco comment: quit 2018  Substance Use Topics   Alcohol use: Yes    Comment: 2-3 drinks 4-5 nights a week, liquor   Drug use: Yes    Frequency: 1.0 times per week    Types: Marijuana    Comment: "4 times a month, maybe"     Allergies   Claritin  [loratadine]   Review of Systems Review of Systems  Constitutional: Negative for chills and fever.  HENT: Negative for ear pain and sore throat.   Eyes: Negative for pain and visual disturbance.  Respiratory: Negative for cough and shortness of breath.   Cardiovascular: Positive for leg swelling. Negative for chest pain and palpitations.  Gastrointestinal: Negative for abdominal pain, nausea and vomiting.  Genitourinary: Negative for dysuria and hematuria.  Musculoskeletal: Negative for arthralgias and back pain.  Skin: Negative for color change and rash.  Neurological: Negative for seizures and syncope.  All other systems reviewed and are negative.    Physical Exam Updated Vital Signs BP 139/78 (BP Location: Left Arm)    Pulse 85    Temp 98.8 F (37.1 C) (Oral)    Resp 20    Ht 6\' 1"  (1.854 m)    Wt 127 kg    SpO2 97%    BMI 36.94 kg/m   Physical Exam Vitals signs and nursing note reviewed.  Constitutional:      Appearance: He is well-developed.  HENT:     Head: Normocephalic and atraumatic.     Mouth/Throat:     Pharynx: Oropharynx is clear.  Eyes:     Conjunctiva/sclera: Conjunctivae normal.  Neck:     Musculoskeletal: Neck supple.  Cardiovascular:     Rate and Rhythm: Normal rate and regular rhythm.     Heart sounds: No murmur.  Pulmonary:     Effort: Pulmonary effort is normal. No respiratory distress.     Breath sounds: Normal breath sounds.  Abdominal:     Palpations: Abdomen is soft.     Tenderness: There is no abdominal tenderness.  Musculoskeletal:     Right lower leg: Edema present.     Left lower leg: Edema present.     Comments: Bilateral lower extremity edema 4+ pitting equal.  Patient has erythema throughout bilateral lower extremities with scaling of the skin and some superficial ulcers.  Skin:  General: Skin is warm and dry.  Neurological:     Mental Status: He is alert.  Psychiatric:        Mood and Affect: Mood normal.           ED  Treatments / Results  Labs (all labs ordered are listed, but only abnormal results are displayed) Labs Reviewed  LACTIC ACID, PLASMA - Abnormal; Notable for the following components:      Result Value   Lactic Acid, Venous 7.2 (*)    All other components within normal limits  LACTIC ACID, PLASMA - Abnormal; Notable for the following components:   Lactic Acid, Venous 7.5 (*)    All other components within normal limits  CBC WITH DIFFERENTIAL/PLATELET - Abnormal; Notable for the following components:   RBC 2.73 (*)    Hemoglobin 10.8 (*)    HCT 31.6 (*)    MCV 115.8 (*)    MCH 39.6 (*)    RDW 16.3 (*)    All other components within normal limits  BASIC METABOLIC PANEL - Abnormal; Notable for the following components:   Potassium 2.4 (*)    Chloride 96 (*)    CO2 21 (*)    Glucose, Bld 137 (*)    BUN <5 (*)    Calcium 7.8 (*)    Anion gap 18 (*)    All other components within normal limits  CULTURE, BLOOD (ROUTINE X 2)  CULTURE, BLOOD (ROUTINE X 2)  BASIC METABOLIC PANEL  CBC  LACTIC ACID, PLASMA    EKG None  Radiology Vas [place] Lower Extremity Venous (dvt) (only [facility])  Result Date: 01/29/2019  Lower Venous Study Indications: Edema, Erythema, and Open wounds, weeping.  Limitations: Body habitus, open wound and edema, skin texture. Comparison Study: No prior LEV on file Performing Technologist: Sherren Kerns RVS  Examination Guidelines: A complete evaluation includes B-mode imaging, spectral Doppler, color Doppler, and power Doppler as needed of all accessible portions of each vessel. Bilateral testing is considered an integral part of a complete examination. Limited examinations for reoccurring indications may be performed as noted.  Right Venous Findings: +---------+---------------+---------+-----------+----------+--------------+            Compressibility Phasicity Spontaneity Properties Summary          +---------+---------------+---------+-----------+----------+--------------+  CFV       Full                                                             +---------+---------------+---------+-----------+----------+--------------+  SFJ       Full                                                             +---------+---------------+---------+-----------+----------+--------------+  FV Prox   Full                                                             +---------+---------------+---------+-----------+----------+--------------+  FV Mid    Full                                                             +---------+---------------+---------+-----------+----------+--------------+  FV Distal Full                                                             +---------+---------------+---------+-----------+----------+--------------+  PFV       Full                                                             +---------+---------------+---------+-----------+----------+--------------+  POP       Full                                                             +---------+---------------+---------+-----------+----------+--------------+  PTV                                                        Not visualized  +---------+---------------+---------+-----------+----------+--------------+  PERO                                                       Not visualized  +---------+---------------+---------+-----------+----------+--------------+  GSV       Full                                                             +---------+---------------+---------+-----------+----------+--------------+  Right Technical Findings: Not visualized segments include Posterior tibial and peroneal veins.  Left Venous Findings: +---------+---------------+---------+-----------+----------+--------------+            Compressibility Phasicity Spontaneity Properties Summary         +---------+---------------+---------+-----------+----------+--------------+  CFV        Full            Yes       Yes                                    +---------+---------------+---------+-----------+----------+--------------+  SFJ       Full                                                             +---------+---------------+---------+-----------+----------+--------------+  FV Prox   Full                                                             +---------+---------------+---------+-----------+----------+--------------+  FV Mid                    Yes       Yes                    visualized      +---------+---------------+---------+-----------+----------+--------------+  FV Distal                 Yes       Yes                    visualized      +---------+---------------+---------+-----------+----------+--------------+  PFV       Full                                                             +---------+---------------+---------+-----------+----------+--------------+  POP       Full                                                             +---------+---------------+---------+-----------+----------+--------------+  PTV                                                        Not visualized  +---------+---------------+---------+-----------+----------+--------------+  PERO                                                       Not visualized  +---------+---------------+---------+-----------+----------+--------------+  GSV       Full                                                             +---------+---------------+---------+-----------+----------+--------------+  Left Technical Findings: Not visualized segments include posterior tibial and peroneal veins.   Summary: Right: There is no evidence of deep vein thrombosis in the lower extremity. However, portions of this examination were limited- see technologist comments above. Left: There is no evidence of deep vein thrombosis in the lower extremity. However, portions of this examination were limited- see technologist comments above.  *See  table(s) above for measurements and observations. Electronically signed by Sherald Hess MD on 01/29/2019 at 5:18:51 PM.    Final     Procedures Procedures (  including critical care time)  Medications Ordered in ED Medications  aspirin chewable tablet 81 mg (81 mg Oral Given 01/29/19 2108)  amLODipine (NORVASC) tablet 5 mg (5 mg Oral Given 01/29/19 2108)  metoprolol tartrate (LOPRESSOR) tablet 100 mg (100 mg Oral Given 01/29/19 2108)  DULoxetine (CYMBALTA) DR capsule 20 mg (20 mg Oral Given 01/29/19 2112)  docusate sodium (COLACE) capsule 100 mg (has no administration in time range)  senna (SENOKOT) tablet 8.6 mg (8.6 mg Oral Not Given 01/29/19 2108)  multivitamin with minerals tablet 1 tablet (1 tablet Oral Given 01/29/19 2108)  diphenhydrAMINE (BENADRYL) capsule 25 mg (25 mg Oral Given 01/29/19 2108)  enoxaparin (LOVENOX) injection 60 mg (60 mg Subcutaneous Given 01/29/19 2109)  acetaminophen (TYLENOL) tablet 650 mg (has no administration in time range)    Or  acetaminophen (TYLENOL) suppository 650 mg (has no administration in time range)  ondansetron (ZOFRAN) tablet 4 mg (has no administration in time range)    Or  ondansetron (ZOFRAN) injection 4 mg (has no administration in time range)  potassium chloride SA (K-DUR,KLOR-CON) CR tablet 40 mEq (40 mEq Oral Given 01/29/19 2108)  furosemide (LASIX) injection 60 mg (60 mg Intravenous Given 01/29/19 2108)  sodium chloride 0.9 % bolus 500 mL (500 mLs Intravenous New Bag/Given 01/29/19 2113)  vancomycin (VANCOCIN) 1,500 mg in sodium chloride 0.9 % 500 mL IVPB (has no administration in time range)  zolpidem (AMBIEN) tablet 5 mg (5 mg Oral Given 01/29/19 2146)  vancomycin (VANCOCIN) 1,500 mg in sodium chloride 0.9 % 500 mL IVPB (1,500 mg Intravenous New Bag/Given 01/29/19 1818)  piperacillin-tazobactam (ZOSYN) IVPB 3.375 g (0 g Intravenous Stopped 01/29/19 1806)  potassium chloride SA (K-DUR,KLOR-CON) CR tablet 40 mEq (40 mEq Oral Given 01/29/19 1725)   sodium chloride 0.9 % bolus 2,000 mL (2,000 mLs Intravenous New Bag/Given 01/29/19 1721)  sodium chloride 0.9 % bolus 1,800 mL (1,800 mLs Intravenous New Bag/Given 01/29/19 1809)     Initial Impression / Assessment and Plan / ED Course  I have reviewed the triage vital signs and the nursing notes.  Pertinent labs & imaging results that were available during my care of the patient were reviewed by me and considered in my medical decision making (see chart for details).  Clinical Course as of Jan 28 2210  Mon Jan 29, 2019  1703 Patient lactic acid 7.2.  Initiate sepsis protocol.   [KM]  1726 Patient to be admitted for sepsis due to BLE cellulitis.    [KM]    Clinical Course User Index [KM] Arlyn Dunning, PA-C      CRITICAL CARE Performed by: Arlyn Dunning   Total critical care time:  minutes  Critical care time was exclusive of separately billable procedures and treating other patients.  Critical care was necessary to treat or prevent imminent or life-threatening deterioration.  Critical care was time spent personally by me on the following activities: development of treatment plan with patient and/or surrogate as well as nursing, discussions with consultants, evaluation of patient's response to treatment, examination of patient, obtaining history from patient or surrogate, ordering and performing treatments and interventions, ordering and review of laboratory studies, ordering and review of radiographic studies, pulse oximetry and re-evaluation of patient's condition.    Final Clinical Impressions(s) / ED Diagnoses   Final diagnoses:  Sepsis without acute organ dysfunction, due to unspecified organism [facility])  Cellulitis of lower extremity, unspecified laterality    ED Discharge Orders    None  Jeral Pinch 01/29/19 2211    [name], MD 02/01/19 0959    Arlyn Dunning, PA-C 02/02/19 1610    [name], MD 02/02/19 [phone]

## 2019-01-29 NOTE — ED Triage Notes (Signed)
Patient c/o bilateral leg swelling ( upper and lower ) x 2 weeks. Patient has redness on the lower extremities and has open wounds that are weeping. Patient denies any fever.

## 2019-01-29 NOTE — H&P (Signed)
History and Physical    [name] SKA:[id number] DOB: 1961/05/04 DOA: 01/29/2019  PCP: [name] Sanes, MD   Patient coming from: Home  Chief Complaint: Lower extremity erythema and edema.   HPI: [name] is a 58 y.o. male with medical history significant of hypertension, type 2 diabetes mellitus, and obesity, who presents with bilateral lower extremity painful edema.  He reports 3 weeks of worsening lower extremity edema, moderate to severe in intensity, worse while standing, improved while laying or elevating his legs, associated with bilateral erythema and local tenderness, a long with local oozing blisters.  He was seen by his primary care provider who prescribed antibiotic therapy March 27 he completed 10 days therapy with no improvement, was advised to double his furosemide dose and his antibiotic was changed. He has followed this recommendations for the last 48 hours, with no further improvement of his symptoms.  Unfortunately due to coronavirus pandemic he was unable to see his primary care provider in person and he was referred to the emergency room for further evaluation.  Patient denies any fever, or dyspnea.  No chest pain or angina.  He had MRSA skin infection in the past in his feet and resulted in toes amputations.   ED Course: Patient was noted to have bilateral lower extremity erythema, he was diagnosed with bilateral cellulitis, received IV antibiotic therapy and referred for admission for evaluation.  Patient failed outpatient therapy.  Review of Systems:  1. General: No fevers, no chills, no weight gain or weight loss 2. ENT: No runny nose or sore throat, no hearing disturbances 3. Pulmonary: No dyspnea, cough, wheezing, or hemoptysis 4. Cardiovascular: No angina, claudication, positive lower extremity edema.  5. Gastrointestinal: No nausea or vomiting, positive diarrhea. 6. Hematology: No easy bruisability or frequent infections 7. Urology: No dysuria, hematuria or  increased urinary frequency 8. Dermatology: lower extremity rashes, tender.  9. Neurology: No seizures or paresthesias 10. Musculoskeletal: No joint pain or deformities  Past Medical History:  Diagnosis Date   Alcohol dependence (HCC)    Arthritis    hips, hands   Bilateral carpal tunnel syndrome    Bilateral leg edema    Chronic   Diabetes mellitus without complication (HCC)    type 2   Diverticulitis    portion of colon removed   DOE (dyspnea on exertion)    occ   Elevated liver enzymes    Fatty liver    GERD (gastroesophageal reflux disease)    occ   Hammer toe    Hip pain    History of ventral hernia repair 2016   x2   Hyperlipidemia    pt unsure   Hypertension    Marijuana abuse    Morbid obesity (HCC)    Neuromuscular disorder (HCC)    peripheral neuropathy feet and few fingers   OSA (obstructive sleep apnea)    has OSA-not used CPAP 2-3 yrs could not tolerate cpap   PONV (postoperative nausea and vomiting)    Toe ulcer (HCC)    left healed    Past Surgical History:  Procedure Laterality Date   AMPUTATION TOE Left 05/25/2018   Procedure: AMPUTATION TOE left 3rd;  Surgeon: [name] Arthurs, MD;  Location: [facility];  Service: Orthopedics;  Laterality: Left;  , to follow   AMPUTATION TOE Left 06/28/2018   Procedure: Left foot revision 3rd toe amputation including 3rd metatarsal;  Surgeon: [name] Arthurs, MD;  Location: [facility];  Service: Orthopedics;  Laterality:  Left;    BICEPS TENDON REPAIR Left 2014   Partial   COLONOSCOPY     HERNIA REPAIR  2016   ventral   HIP CLOSED REDUCTION Right 09/01/2018   Procedure: CLOSED REDUCTION HIP;  Surgeon: [name] Kida, MD;  Location: [facility];  Service: Orthopedics;  Laterality: Right;   JOINT REPLACEMENT     b/l knees    TOE AMPUTATION Right 09/2013   TOTAL HIP ARTHROPLASTY Right 08/16/2018   Procedure: TOTAL HIP ARTHROPLASTY ANTERIOR APPROACH;   Surgeon: [name] Frederic, MD;  Location: [facility];  Service: Orthopedics;  Laterality: Right;   TOTAL HIP ARTHROPLASTY Left 11/02/2018   Procedure: TOTAL HIP ARTHROPLASTY ANTERIOR APPROACH;  Surgeon: [name] Frederic, MD;  Location: [facility];  Service: Orthopedics;  Laterality: Left;   TOTAL KNEE ARTHROPLASTY     bilat     reports that he has quit smoking. His smoking use included cigarettes. He has a 2.50 pack-year smoking history. He has never used smokeless tobacco. He reports current alcohol use. He reports current drug use. Frequency: 1.00 time per week. Drug: Marijuana.  Allergies  Allergen Reactions   Claritin [Loratadine] Shortness Of Breath and Anxiety    Family History  Adopted: Yes  Family history unknown: Yes     Prior to Admission medications   Medication Sig Start Date End Date Taking? Authorizing Provider  amLODipine (NORVASC) 5 MG tablet Take 1 tablet (5 mg total) by mouth daily. 10/30/18  Yes [name], [name] Mo, MD  aspirin 81 MG chewable tablet Chew 1 tablet (81 mg total) by mouth 2 (two) times daily. 11/03/18  Yes [name], [name] John, MD  calcium carbonate (TUMS - DOSED IN MG ELEMENTAL CALCIUM) 500 MG chewable tablet Chew 2 tablets by mouth daily as needed for indigestion or heartburn.   Yes [provider]  diphenhydrAMINE (BENADRYL ALLERGY) 25 MG tablet Take 25 mg by mouth daily.   Yes [provider]  docusate sodium (COLACE) 100 MG capsule Take 1 capsule (100 mg total) by mouth 2 (two) times daily. Patient taking differently: Take 100 mg by mouth daily as needed for mild constipation.  11/03/18  Yes [name], [name] John, MD  Docusate Sodium 100 MG capsule Take 100 mg by mouth daily. 11/03/18  Yes [provider]  doxycycline (VIBRA-TABS) 100 MG tablet Take 1 tablet (100 mg total) by mouth 2 (two) times daily. 01/24/19  Yes [name], [name] Mo, MD  DULoxetine (CYMBALTA) 20 MG capsule Take 1 capsule (20 mg total) by mouth daily. Patient taking differently: Take 20  mg by mouth every other day.  10/30/18  Yes [name], [name] Mo, MD  furosemide (LASIX) 20 MG tablet TAKE 2 TABLETS BY MOUTH EVERY DAY 12/25/18  Yes [name], [name] Mo, MD  KLOR-CON M20 20 MEQ tablet TAKE 1 TABLET BY MOUTH EVERY DAY 01/22/19  Yes [name], [name] Mo, MD  metFORMIN (GLUCOPHAGE) 500 MG tablet Take 1 tablet (500 mg total) by mouth 2 (two) times daily with a meal. 06/30/18  Yes [name], [name] Mo, MD  metoprolol tartrate (LOPRESSOR) 100 MG tablet TAKE 1 TABLET (100 MG TOTAL) BY MOUTH 2 (TWO) TIMES DAILY. Patient taking differently: Take 100 mg by mouth See admin instructions. "twice daily every other day" 03/20/18  Yes [name], [name] Mo, MD  Multiple Vitamin (MULTIVITAMIN WITH MINERALS) TABS tablet Take 1 tablet by mouth daily. 02/20/18  Yes Mikhail, Nita Sells, DO  senna (SENOKOT) 8.6 MG TABS tablet Take 1 tablet (8.6 mg total) by mouth 2 (two) times daily. 08/17/18  Yes [name], [name] John, MD  folic acid (FOLVITE) 1 MG tablet Take 1 tablet (1 mg total) by mouth daily. Patient not taking: Reported on 01/29/2019 02/20/18   Edsel Petrin, DO  ondansetron (ZOFRAN) 4 MG tablet Take 1 tablet (4 mg total) by mouth every 6 (six) hours as needed for nausea. Patient not taking: Reported on 01/29/2019 11/03/18   [name] Frederic, MD  thiamine 100 MG tablet Take 1 tablet (100 mg total) by mouth daily. Patient not taking: Reported on 01/29/2019 02/20/18   Edsel Petrin, DO    Physical Exam: Vitals:   01/29/19 1541 01/29/19 1542  BP:  (!) 144/67  Pulse:  84  Resp:  18  Temp:  97.9 F (36.6 C)  TempSrc:  Oral  SpO2:  98%  Weight: 127 kg   Height:  (1.854 m)     Vitals:   01/29/19 1541 01/29/19 1542  BP:  (!) 144/67  Pulse:  84  Resp:  18  Temp:  97.9 F (36.6 C)  TempSrc:  Oral  SpO2:  98%  Weight: 127 kg   Height:  (1.854 m)    General:  Deconditioned  Neurology: Awake and alert, non focal Head and Neck. Head normocephalic. Neck supple with no adenopathy or thyromegaly.   E ENT: mild pallor, no  icterus, oral mucosa moist Cardiovascular: No JVD. S1-S2 present, rhythmic, no gallops, rubs, or murmurs. Positive non pitting lower extremity edema ++++. Pulmonary: positive breath sounds bilaterally, adequate air movement, no wheezing, rhonchi or rales. Gastrointestinal. Abdomen protuberant with no organomegaly, non tender, no rebound or guarding Skin. Bilateral lower extremity blanching erythema, no frank skin ulcerations, no purulence, some ruptured blisters.  Musculoskeletal: some toes missing.     Right leg    Left leg.   Labs on Admission: I have personally reviewed following labs and imaging studies  CBC: Recent Labs  Lab 01/29/19 1614  WBC 8.2  NEUTROABS PENDING  HGB 10.8*  HCT 31.6*  MCV 115.8*  PLT 207   Basic Metabolic Panel: Recent Labs  Lab 01/29/19 1614  NA 135  K 2.4*  CL 96*  CO2 21*  GLUCOSE 137*  BUN <5*  CREATININE 0.76  CALCIUM 7.8*   GFR: Estimated Creatinine Clearance: 142.2 mL/min (by C-G formula based on SCr of 0.76 mg/dL). Liver Function Tests: No results for input(s): AST, ALT, ALKPHOS, BILITOT, PROT, ALBUMIN in the last 168 hours. No results for input(s): LIPASE, AMYLASE in the last 168 hours. No results for input(s): AMMONIA in the last 168 hours. Coagulation Profile: No results for input(s): INR, PROTIME in the last 168 hours. Cardiac Enzymes: No results for input(s): CKTOTAL, CKMB, CKMBINDEX, TROPONINI in the last 168 hours. BNP (last 3 results) No results for input(s): PROBNP in the last 8760 hours. HbA1C: No results for input(s): HGBA1C in the last 72 hours. CBG: No results for input(s): GLUCAP in the last 168 hours. Lipid Profile: No results for input(s): CHOL, HDL, LDLCALC, TRIG, CHOLHDL, LDLDIRECT in the last 72 hours. Thyroid Function Tests: No results for input(s): TSH, T4TOTAL, FREET4, T3FREE, THYROIDAB in the last 72 hours. Anemia Panel: No results for input(s): VITAMINB12, FOLATE, FERRITIN, TIBC, IRON, RETICCTPCT in  the last 72 hours. Urine analysis: No results found for: COLORURINE, APPEARANCEUR, LABSPEC, PHURINE, GLUCOSEU, HGBUR, BILIRUBINUR, KETONESUR, PROTEINUR, UROBILINOGEN, NITRITE, LEUKOCYTESUR  Radiological Exams on Admission: Vas [place] Lower Extremity Venous (dvt) (only [facility])  Result Date: 01/29/2019  Lower Venous Study Indications: Edema, Erythema, and Open wounds, weeping.  Limitations: Body habitus, open wound and edema, skin texture. Comparison Study: No prior LEV on file Performing Technologist: Sherren Kerns RVS  Examination Guidelines: A complete evaluation includes B-mode imaging, spectral Doppler, color Doppler, and power Doppler as needed of all accessible portions of each vessel. Bilateral testing is considered an integral part of a complete examination. Limited examinations for reoccurring indications may be performed as noted.  Right Venous Findings: +---------+---------------+---------+-----------+----------+--------------+            Compressibility Phasicity Spontaneity Properties Summary         +---------+---------------+---------+-----------+----------+--------------+  CFV       Full                                                             +---------+---------------+---------+-----------+----------+--------------+  SFJ       Full                                                             +---------+---------------+---------+-----------+----------+--------------+  FV Prox   Full                                                             +---------+---------------+---------+-----------+----------+--------------+  FV Mid    Full                                                             +---------+---------------+---------+-----------+----------+--------------+  FV Distal Full                                                             +---------+---------------+---------+-----------+----------+--------------+  PFV       Full                                                              +---------+---------------+---------+-----------+----------+--------------+  POP       Full                                                             +---------+---------------+---------+-----------+----------+--------------+  PTV  Not visualized  +---------+---------------+---------+-----------+----------+--------------+  PERO                                                       Not visualized  +---------+---------------+---------+-----------+----------+--------------+  GSV       Full                                                             +---------+---------------+---------+-----------+----------+--------------+  Right Technical Findings: Not visualized segments include Posterior tibial and peroneal veins.  Left Venous Findings: +---------+---------------+---------+-----------+----------+--------------+            Compressibility Phasicity Spontaneity Properties Summary         +---------+---------------+---------+-----------+----------+--------------+  CFV       Full            Yes       Yes                                    +---------+---------------+---------+-----------+----------+--------------+  SFJ       Full                                                             +---------+---------------+---------+-----------+----------+--------------+  FV Prox   Full                                                             +---------+---------------+---------+-----------+----------+--------------+  FV Mid                    Yes       Yes                    visualized      +---------+---------------+---------+-----------+----------+--------------+  FV Distal                 Yes       Yes                    visualized      +---------+---------------+---------+-----------+----------+--------------+  PFV       Full                                                             +---------+---------------+---------+-----------+----------+--------------+  POP        Full                                                             +---------+---------------+---------+-----------+----------+--------------+  PTV                                                        Not visualized  +---------+---------------+---------+-----------+----------+--------------+  PERO                                                       Not visualized  +---------+---------------+---------+-----------+----------+--------------+  GSV       Full                                                             +---------+---------------+---------+-----------+----------+--------------+  Left Technical Findings: Not visualized segments include posterior tibial and peroneal veins.   Summary: Right: There is no evidence of deep vein thrombosis in the lower extremity. However, portions of this examination were limited- see technologist comments above. Left: There is no evidence of deep vein thrombosis in the lower extremity. However, portions of this examination were limited- see technologist comments above.  *See table(s) above for measurements and observations. Electronically signed by Sherald Hess MD on 01/29/2019 at 5:18:51 PM.    Final     EKG: Independently reviewed.  82 bpm, normal PR, normal QRS, normal axis, sinus rhythm, long QTc 571, poor R wave progression, no significant ST segment or T wave changes  Assessment/Plan Active Problems:   Hypertension   Morbid obesity (HCC)   Cellulitis   Type 2 diabetes mellitus with hyperlipidemia (HCC)  58 year old male who has hypertension, type 2 diabetes mellitus and obesity who presents with 3 weeks of worsening lower extremity erythematous edema, it has been refractive to outpatient therapy to an increased dose of furosemide and oral antibiotics.  Symptoms tend to be worse when standing, patient denied any fevers, angina or dyspnea.  On his initial physical examination his blood pressure is 144/67, temperature 97.9, heart rate 84, respirate 18,  oxygen saturation 98% on room, he has mild pallor, his lungs are clear to auscultation bilaterally, heart S1-S2 present rhythmic, abdomen protuberant nontender, +4+ lower extremity edema, positive blanching erythema bilaterally, no purulence.  No increased local temperature.  Sodium 135, potassium 2.4, chloride 96, bicarb 21, glucose 137, BUN less than 5, creatinine 0.76, white count 8.2, hemoglobin 10.8, hematocrit 31.6, platelets 207.  Ultrasonography of the lower extremities negative for deep vein thrombosis.  Patient will be admitted to the hospital with working diagnosis of worsening bilateral extremity edema, rule out persistent cellulitis, not vascular insufficiency, heart failure.   1.  Lower extremity edema/ rule out bilateral cellulitis.  Patient will be admitted to the medical ward, will continue antibiotic therapy with IV vancomycin for now, he does have symptoms that may suggest venous insufficiency, due to blanching erythema.  He also does have risk factors for heart failure.  Will add diuretic therapy with IV furosemide, 60 mg intravenously twice daily, follow-up response.  If his edema and erythema improves with diuresis, will encourage to stop antibiotics.  Check echocardiogram.  2.  Hypokalemia.  Continue K correction with  potassium chloride orally, patient had 40 mEq in the emergency department while at another 80 mEq divided into doses.  Follow-up kidney function in the morning.  3.  Hypertension.  Continue blood pressure control with amlodipine and metoprolol.  4.  Depression.  Continue duloxetine.  5.  Obesity.  Will get patient's weight calculate his BMI.   DVT prophylaxis:  Enoxaparin  Code Status: full  Family Communication: no family at the bedside   Disposition Plan: Med-Surg   Consults called: none   Admission status: Inpatient.     Verbena Boeding Annett Gulaaniel Jessenya Berdan MD [facility]   01/29/2019, 5:29 PM

## 2019-01-29 NOTE — ED Notes (Signed)
Date and time results received: 01/29/19 1704 (use smartphrase ".now" to insert current time)  Test: K+ 2.4 Critical Value: Lactic acid 7.2  Name of Provider Notified: Ronnie Doss  Orders Received? Or Actions Taken?: reported K+ 2.4, lactic acid 7.2 to [employer].

## 2019-01-29 NOTE — ED Notes (Signed)
US at bedside

## 2019-01-29 NOTE — Progress Notes (Signed)
Pharmacy Antibiotic Note  [name] is a 58 y.o. male admitted on 01/29/2019 with with medical history significant of hypertension, type 2 diabetes mellitus, and obesity, who presents with bilateral lower extremity painful edema.  Pharmacy has been consulted for vancomycin dosing for cellulitis.  Plan: vanc 1500mg  IV x 1 in ED then 1500mg  q12h (AUC 472.6, Scr 0.8, Cmin 11.8) Follow renal function and clinical course vanc peak and trough as indicated  Height: 6\' 1"  (185.4 cm) Weight: 280 lb (127 kg) IBW/kg (Calculated) : 79.9  Temp (24hrs), Avg:98.4 F (36.9 C), Min:97.9 F (36.6 C), Max:98.8 F (37.1 C)  Recent Labs  Lab 01/29/19 1614 01/29/19 1919  WBC 8.2  --   CREATININE 0.76  --   LATICACIDVEN 7.2* 7.5*    Estimated Creatinine Clearance: 142.2 mL/min (by C-G formula based on SCr of 0.76 mg/dL).    Allergies  Allergen Reactions  . Claritin [Loratadine] Shortness Of Breath and Anxiety    Antimicrobials this admission: 4/13 zosyn >> 4/13 vanc >>  Dose adjustments this admission:   Microbiology results: 4/13 BCx:    Thank you for allowing pharmacy to be a part of this patient's care.  Arley Phenix RPh 01/29/2019, 8:34 PM Pager [phone]

## 2019-01-29 NOTE — ED Notes (Signed)
RN on the floor will call back after report

## 2019-01-29 NOTE — ED Notes (Signed)
Pt called out for restroom assistance for a bowel movement. Provided bedside commode. Call light is within reach and pt is advised to call out when he is finished.

## 2019-01-29 NOTE — Progress Notes (Signed)
VASCULAR LAB PRELIMINARY  PRELIMINARY  PRELIMINARY  PRELIMINARY  Bilateral lower extremity venous duplex completed.    Preliminary report:  See CV Proc for results.  [name] Alto, PA, and Dr. Criss Alvine report  [name], [name], RVT 01/29/2019, 4:46 PM    \

## 2019-01-30 ENCOUNTER — Other Ambulatory Visit: Payer: Self-pay

## 2019-01-30 ENCOUNTER — Inpatient Hospital Stay (HOSPITAL_COMMUNITY): Payer: BLUE CROSS/BLUE SHIELD

## 2019-01-30 DIAGNOSIS — I5033 Acute on chronic diastolic (congestive) heart failure: Secondary | ICD-10-CM | POA: Diagnosis present

## 2019-01-30 LAB — LACTIC ACID, PLASMA: Lactic Acid, Venous: 3 mmol/L (ref 0.5–1.9)

## 2019-01-30 LAB — ECHOCARDIOGRAM COMPLETE
Height: 73 in
Weight: 4480 oz

## 2019-01-30 LAB — CBC
HCT: 30.9 % — ABNORMAL LOW (ref 39.0–52.0)
Hemoglobin: 10.2 g/dL — ABNORMAL LOW (ref 13.0–17.0)
MCH: 38.1 pg — ABNORMAL HIGH (ref 26.0–34.0)
MCHC: 33 g/dL (ref 30.0–36.0)
MCV: 115.3 fL — ABNORMAL HIGH (ref 80.0–100.0)
Platelets: 179 10*3/uL (ref 150–400)
RBC: 2.68 MIL/uL — ABNORMAL LOW (ref 4.22–5.81)
RDW: 16.5 % — ABNORMAL HIGH (ref 11.5–15.5)
WBC: 6.8 10*3/uL (ref 4.0–10.5)
nRBC: 0 % (ref 0.0–0.2)

## 2019-01-30 LAB — GLUCOSE, CAPILLARY
Glucose-Capillary: 114 mg/dL — ABNORMAL HIGH (ref 70–99)
Glucose-Capillary: 132 mg/dL — ABNORMAL HIGH (ref 70–99)
Glucose-Capillary: 127 mg/dL — ABNORMAL HIGH (ref 70–99)
Glucose-Capillary: 114 mg/dL — ABNORMAL HIGH (ref 70–99)
Glucose-Capillary: 114 mg/dL — ABNORMAL HIGH (ref 70–99)

## 2019-01-30 LAB — BASIC METABOLIC PANEL
Anion gap: 13 (ref 5–15)
BUN: <5 mg/dL — ABNORMAL LOW (ref 6–20)
CO2: 23 mmol/L (ref 22–32)
Calcium: 7.2 mg/dL — ABNORMAL LOW (ref 8.9–10.3)
Chloride: 101 mmol/L (ref 98–111)
Creatinine, Ser: 0.68 mg/dL (ref 0.61–1.24)
GFR calc Af Amer: >60 mL/min (ref >60)
GFR calc non Af Amer: >60 mL/min (ref >60)
Glucose, Bld: 124 mg/dL — ABNORMAL HIGH (ref 70–99)
Potassium: 2.4 mmol/L — CL (ref 3.5–5.1)
Sodium: 137 mmol/L (ref 135–145)

## 2019-01-30 LAB — MAGNESIUM: Magnesium: 1.1 mg/dL — ABNORMAL LOW (ref 1.7–2.4)

## 2019-01-30 MED ORDER — LORAZEPAM 0.5 MG PO TABS
0.5000 mg | ORAL_TABLET | Freq: Once | ORAL | Status: AC
  Administered 2019-01-30: 0.5 mg via ORAL
  Filled 2019-01-30: qty 1

## 2019-01-30 MED ORDER — INSULIN ASPART 100 UNIT/ML ~~LOC~~ SOLN
0.0000 [IU] | Freq: Three times a day (TID) | SUBCUTANEOUS | Status: DC
  Administered 2019-01-31: 1 [IU] via SUBCUTANEOUS

## 2019-01-30 MED ORDER — HYDROCERIN EX CREA
TOPICAL_CREAM | Freq: Every day | CUTANEOUS | Status: DC
  Administered 2019-01-30 – 2019-02-02 (×4): via TOPICAL
  Filled 2019-01-30: qty 113

## 2019-01-30 MED ORDER — MAGNESIUM SULFATE 2 GM/50ML IV SOLN
2.0000 g | Freq: Once | INTRAVENOUS | Status: AC
  Administered 2019-01-30: 13:00:00 2 g via INTRAVENOUS
  Filled 2019-01-30: qty 50

## 2019-01-30 MED ORDER — ALPRAZOLAM 0.5 MG PO TABS
0.5000 mg | ORAL_TABLET | Freq: Three times a day (TID) | ORAL | Status: DC | PRN
  Administered 2019-01-30 – 2019-02-01 (×3): 0.5 mg via ORAL
  Filled 2019-01-30 (×3): qty 1

## 2019-01-30 MED ORDER — POTASSIUM CHLORIDE CRYS ER 20 MEQ PO TBCR
40.0000 meq | EXTENDED_RELEASE_TABLET | ORAL | Status: AC
  Administered 2019-01-30 (×3): 40 meq via ORAL
  Filled 2019-01-30 (×3): qty 2

## 2019-01-30 MED ORDER — LOPERAMIDE HCL 2 MG PO CAPS
2.0000 mg | ORAL_CAPSULE | Freq: Once | ORAL | Status: AC
  Administered 2019-01-30: 22:00:00 2 mg via ORAL
  Filled 2019-01-30: qty 1

## 2019-01-30 MED ORDER — SODIUM CHLORIDE 0.9 % IV BOLUS (SEPSIS)
500.0000 mL | Freq: Once | INTRAVENOUS | Status: AC
  Administered 2019-01-30: 500 mL via INTRAVENOUS

## 2019-01-30 NOTE — Progress Notes (Addendum)
PROGRESS NOTE    [name]  PPI:[id number] DOB: 21-Jan-1961 DOA: 01/29/2019 PCP: [name], MD    Brief Narrative:  58 year old male who has hypertension, type 2 diabetes mellitus and obesity who presents with 3 weeks of worsening lower extremity erythematous edema, it has been refractive to outpatient therapy to an increased dose of furosemide and oral antibiotics.  Symptoms tend to be worse when standing, patient denied any fevers, angina or dyspnea.  On his initial physical examination his blood pressure is 144/67, temperature 97.9, heart rate 84, respirate 18, oxygen saturation 98% on room, he has mild pallor, his lungs are clear to auscultation bilaterally, heart S1-S2 present rhythmic, abdomen protuberant nontender, +4+ lower extremity edema, positive blanching erythema bilaterally, no purulence.  No increased local temperature.  Sodium 135, potassium 2.4, chloride 96, bicarb 21, glucose 137, BUN less than 5, creatinine 0.76, white count 8.2, hemoglobin 10.8, hematocrit 31.6, platelets 207.  Ultrasonography of the lower extremities negative for deep vein thrombosis.  Patient will be admitted to the hospital with working diagnosis of worsening bilateral extremity edema, rule out persistent cellulitis, not vascular insufficiency, heart failure   Assessment & Plan:   Active Problems:   Hypertension   Morbid obesity (HCC)   Cellulitis   Type 2 diabetes mellitus with hyperlipidemia (HCC)   Acute on chronic diastolic CHF (congestive heart failure) (HCC)  1. Suspected diastolic heart failure/ acute on chronic. His symptoms have improved with diuresis but not back to normal, urine output over last 24 H 2,200 ml, will continue aggressive diuresis with furosemide 60 mg IV q12. Follow on echocardiogram. Blood pressure controlled with metoprolol 100 mg bid.   2.  Hypokalemia and hypomagnesemia.  Persistent hypokalemia, K down to 2,4, will continue aggressive K correction with po Kcl 120  meq in 3 divided doses. Renal function preserved with serum cr at 0.68. Serum bicarbonate at 23.   3.  Hypertension.  Will continue blood pressure control with metoprolol. Will discontinue amlodipine for now, that can exacerbate lower extremity edema.   4.  Depression. On duloxetine, will add as needed alprazolam for anxiety and ambien at night for sleep.   5.  Obesity.  BMI is 36,9, will need outpatient follow up.   6. Bilateral lower extremity wounds/ no deep cellulitis. Clinically predominately superficial, will discontinue IV antibiotic therapy for now and will consult the wound care team for further recommendations. Noted elevated lactic acid, likely B lactic acid. No clinical signs of sepsis. Will monitor patient off IV antibiotic therapy. He did have oral antibiotic therapy with MRSA coverage as outpatient with no change in his symptoms.   7. T2DM. Will continue glucose cover and monitoring with insulin sliding scale, hold on metformin for now. Fasting glucose this am 124.    DVT prophylaxis: enoxaparin   Code Status:  full Family Communication: no family at the bedside Disposition Plan/ discharge barriers: pending clinical improvement.   Body mass index is 36.94 kg/m. Malnutrition Type:      Malnutrition Characteristics:      Nutrition Interventions:     RN Pressure Injury Documentation:     Consultants:     Procedures:     Antimicrobials:       Subjective: Patient feeling better, improved lower extremity edema, but still not yet back to baseline. Positive diarrhea, no nausea or vomiting. No dyspnea.   Objective: Vitals:   01/29/19 1541 01/29/19 1542 01/29/19 2023 01/30/19 0428  BP:  (!) 144/67 139/78 110/71  Pulse:  84 85 85  Resp:  18 20 20   Temp:  97.9 F (36.6 C) 98.8 F (37.1 C) 99.4 F (37.4 C)  TempSrc:  Oral Oral Oral  SpO2:  98% 97% 97%  Weight: 127 kg     Height: 6\' 1"  (1.854 m)       Intake/Output Summary (Last 24 hours) at  01/30/2019 1042 Last data filed at 01/30/2019 1014 Gross per 24 hour  Intake 240 ml  Output 2850 ml  Net -2610 ml   Filed Weights   01/29/19 1541  Weight: 127 kg    Examination:   General: deconditioned  Neurology: Awake and alert, non focal  E ENT: mild pallor, no icterus, oral mucosa moist Cardiovascular: No JVD. S1-S2 present, rhythmic, no gallops, rubs, or murmurs. ++++ pitting lower extremity edema. Pulmonary: vesicular breath sounds bilaterally, adequate air movement, no wheezing, rhonchi or rales. Gastrointestinal. Abdomen protuberant with no organomegaly, non tender, no rebound or guarding Skin. Blanching erythema, no purulence.  Musculoskeletal: no joint deformities     Data Reviewed: I have personally reviewed following labs and imaging studies  CBC: Recent Labs  Lab 01/29/19 1614 01/30/19 0419  WBC 8.2 6.8  NEUTROABS 5.8  --   HGB 10.8* 10.2*  HCT 31.6* 30.9*  MCV 115.8* 115.3*  PLT 207 179   Basic Metabolic Panel: Recent Labs  Lab 01/29/19 1614 01/30/19 0419  NA 135 137  K 2.4* 2.4*  CL 96* 101  CO2 21* 23  GLUCOSE 137* 124*  BUN <5* <5*  CREATININE 0.76 0.68  CALCIUM 7.8* 7.2*  MG  --  1.1*   GFR: Estimated Creatinine Clearance: 142.2 mL/min (by C-G formula based on SCr of 0.68 mg/dL). Liver Function Tests: No results for input(s): AST, ALT, ALKPHOS, BILITOT, PROT, ALBUMIN in the last 168 hours. No results for input(s): LIPASE, AMYLASE in the last 168 hours. No results for input(s): AMMONIA in the last 168 hours. Coagulation Profile: No results for input(s): INR, PROTIME in the last 168 hours. Cardiac Enzymes: No results for input(s): CKTOTAL, CKMB, CKMBINDEX, TROPONINI in the last 168 hours. BNP (last 3 results) No results for input(s): PROBNP in the last 8760 hours. HbA1C: No results for input(s): HGBA1C in the last 72 hours. CBG: Recent Labs  Lab 01/30/19 0736  GLUCAP 132*   Lipid Profile: No results for input(s): CHOL, HDL,  LDLCALC, TRIG, CHOLHDL, LDLDIRECT in the last 72 hours. Thyroid Function Tests: No results for input(s): TSH, T4TOTAL, FREET4, T3FREE, THYROIDAB in the last 72 hours. Anemia Panel: No results for input(s): VITAMINB12, FOLATE, FERRITIN, TIBC, IRON, RETICCTPCT in the last 72 hours.    Radiology Studies: I have reviewed all of the imaging during this hospital visit personally     Scheduled Meds: . amLODipine  5 mg Oral Daily  . aspirin  81 mg Oral BID  . diphenhydrAMINE  25 mg Oral Daily  . DULoxetine  20 mg Oral QODAY  . enoxaparin (LOVENOX) injection  60 mg Subcutaneous Q24H  . furosemide  60 mg Intravenous Q12H  . metoprolol tartrate  100 mg Oral BID  . multivitamin with minerals  1 tablet Oral Daily  . potassium chloride  40 mEq Oral Q4H  . senna  1 tablet Oral BID   Continuous Infusions: . vancomycin 1,500 mg (01/30/19 0523)     LOS: 1 day        Bronc Brosseau Annett Gulaaniel Maalle Starrett, MD

## 2019-01-30 NOTE — Progress Notes (Signed)
  Echocardiogram 2D Echocardiogram has been performed.  Leta Jungling M 01/30/2019, 9:43 AM

## 2019-01-30 NOTE — Progress Notes (Signed)
Nutrition Brief Note  Patient identified on the Malnutrition Screening Tool (MST) Report  Patient's weight fluctuations most likely related to fluid, takes Lasix at home. Pt is eating. No wounds, just cellulitis per WOC note.  Wt Readings from Last 15 Encounters:  01/29/19 127 kg  11/02/18 118.8 kg  08/16/18 127 kg  06/28/18 124.7 kg  05/25/18 127 kg  04/02/18 131.5 kg  02/27/18 135.6 kg  02/17/18 (!) 147.4 kg  02/01/18 135.2 kg  12/21/16 (!) 137 kg  11/23/16 (!) 137.9 kg    Body mass index is 36.94 kg/m. Patient meets criteria for obesity based on current BMI.   Current diet order is Heart Healthy/CHO modified . Labs and medications reviewed.   No nutrition interventions warranted at this time. If nutrition issues arise, please consult RD.   Tilda Franco, MS, RD, LDN [facility] Inpatient Clinical Dietitian Pager: [phone] After Hours Pager: [phone]

## 2019-01-30 NOTE — Consult Note (Addendum)
WOC Nurse wound consult note Reason for Consult: Consult requested for bilat leg cellulitis.  Reviewed the progress notes and photos in the EMR.  Pt has generalized edema and erythremia, and dry scabbed partial thickness patchy areas of wounds to left outer ankle. Pt is on systemic antibiotics to treat cellulitis. Dressing procedure/placement/frequency: Eucerin cream to promote moist healing of dry skin.  Xeroform gauze to promote healing of scabbed area to left outer ankle. Topical treatment orders provided for staff nurses to perform daily. Please re-consult if further assistance is needed.  Thank-you,  Cammie Mcgee MSN, RN, CWOCN, [place], CNS [phone]

## 2019-01-30 NOTE — Progress Notes (Signed)
CRITICAL VALUE ALERT  Critical Value:  Lactic acid 3.0  Date & Time Notied:  01/30/19 ,0900  Provider Notified: Dr Ella Jubilee  Orders Received/Actions taken: yes

## 2019-01-31 LAB — BASIC METABOLIC PANEL
Anion gap: 12 (ref 5–15)
BUN: <5 mg/dL — ABNORMAL LOW (ref 6–20)
CO2: 24 mmol/L (ref 22–32)
Calcium: 7.1 mg/dL — ABNORMAL LOW (ref 8.9–10.3)
Chloride: 102 mmol/L (ref 98–111)
Creatinine, Ser: 0.61 mg/dL (ref 0.61–1.24)
GFR calc Af Amer: >60 mL/min (ref >60)
GFR calc non Af Amer: >60 mL/min (ref >60)
Glucose, Bld: 101 mg/dL — ABNORMAL HIGH (ref 70–99)
Potassium: 3 mmol/L — ABNORMAL LOW (ref 3.5–5.1)
Sodium: 138 mmol/L (ref 135–145)

## 2019-01-31 LAB — CBC WITH DIFFERENTIAL/PLATELET
Abs Immature Granulocytes: 0.06 10*3/uL (ref 0.00–0.07)
Basophils Absolute: 0 10*3/uL (ref 0.0–0.1)
Basophils Relative: 1 %
Eosinophils Absolute: 0 10*3/uL (ref 0.0–0.5)
Eosinophils Relative: 1 %
HCT: 31.7 % — ABNORMAL LOW (ref 39.0–52.0)
Hemoglobin: 10.3 g/dL — ABNORMAL LOW (ref 13.0–17.0)
Immature Granulocytes: 1 %
Lymphocytes Relative: 24 %
Lymphs Abs: 1.5 10*3/uL (ref 0.7–4.0)
MCH: 39 pg — ABNORMAL HIGH (ref 26.0–34.0)
MCHC: 32.5 g/dL (ref 30.0–36.0)
MCV: 120.1 fL — ABNORMAL HIGH (ref 80.0–100.0)
Monocytes Absolute: 0.6 10*3/uL (ref 0.1–1.0)
Monocytes Relative: 10 %
Neutro Abs: 4.2 10*3/uL (ref 1.7–7.7)
Neutrophils Relative %: 63 %
Platelets: 126 10*3/uL — ABNORMAL LOW (ref 150–400)
RBC: 2.64 MIL/uL — ABNORMAL LOW (ref 4.22–5.81)
RDW: 17 % — ABNORMAL HIGH (ref 11.5–15.5)
WBC: 6.5 10*3/uL (ref 4.0–10.5)
nRBC: 0.3 % — ABNORMAL HIGH (ref 0.0–0.2)

## 2019-01-31 LAB — GLUCOSE, CAPILLARY
Glucose-Capillary: 92 mg/dL (ref 70–99)
Glucose-Capillary: 135 mg/dL — ABNORMAL HIGH (ref 70–99)
Glucose-Capillary: 105 mg/dL — ABNORMAL HIGH (ref 70–99)
Glucose-Capillary: 120 mg/dL — ABNORMAL HIGH (ref 70–99)

## 2019-01-31 LAB — MAGNESIUM: Magnesium: 1.3 mg/dL — ABNORMAL LOW (ref 1.7–2.4)

## 2019-01-31 MED ORDER — POTASSIUM CHLORIDE CRYS ER 20 MEQ PO TBCR
40.0000 meq | EXTENDED_RELEASE_TABLET | ORAL | Status: AC
  Administered 2019-01-31 (×3): 40 meq via ORAL
  Filled 2019-01-31 (×3): qty 2

## 2019-01-31 MED ORDER — MAGNESIUM SULFATE 2 GM/50ML IV SOLN
2.0000 g | Freq: Once | INTRAVENOUS | Status: AC
  Administered 2019-01-31: 2 g via INTRAVENOUS
  Filled 2019-01-31: qty 50

## 2019-01-31 MED ORDER — SODIUM CHLORIDE 0.9 % IV SOLN
INTRAVENOUS | Status: DC | PRN
  Administered 2019-01-31: 250 mL via INTRAVENOUS

## 2019-01-31 NOTE — Progress Notes (Signed)
PROGRESS NOTE    [name]  WGN:[id number] DOB: 07-Dec-1960 DOA: 01/29/2019 PCP: [name], MD    Brief Narrative:   58 year old male who has hypertension, type 2 diabetes mellitus and obesity who presents with 3 weeks of worsening lower extremity erythematous edema, it has been refractive to outpatient therapyto anincreased dose of furosemide and oralantibiotics.Symptoms tend to be worse when standing,patient denied any fevers, angina or dyspnea.   On his initial physical examination his blood pressure is 144/67, temperature 97.9, heart rate 84, respirate 18, oxygen saturation 98% on room,he has mild pallor, his lungs are clear to auscultation bilaterally, heart S1-S2 present rhythmic, abdomen protuberant nontender, +4+ lower extremity edema, positive blanching erythema bilaterally, no purulence. No increased local temperature. Sodium 135, potassium 2.4, chloride 96, bicarb 21, glucose 137, BUN less than 5, creatinine 0.76, white count 8.2, hemoglobin 10.8, hematocrit 31.6, platelets 207.Ultrasonography of the lower extremities negative for deep vein thrombosis.  Patient will be admitted to the hospital with working diagnosis of worsening bilateral extremity edema,rule out persistent cellulitis, not vascular insufficiency, heart failure  Assessment & Plan:   Active Problems:   Hypertension   Morbid obesity (HCC)   Cellulitis   Type 2 diabetes mellitus with hyperlipidemia (HCC)   Acute on chronic diastolic CHF (congestive heart failure) (HCC)   Acute on chronic diastolic congestive heart failure Patient presenting with progressive lower extremity edema with bilateral erythematous changes thought to be initially secondary to cellulitis.  Patient has been progressing despite increased dose of furosemide and oral antibiotics outpatient.  Echocardiogram 01/30/2019 notable for EF 60-65% with diastolic dysfunction.  Bilateral lower extremity duplex ultrasound negative for DVT.   Patient is afebrile without leukocytosis. --net negative 1.295L past 24hrs; net negative 3.4 L during hospitalization --Continue metoprolol 100 mg p.o. twice daily --Continue diuresis with furosemide 60 mg IV every 12 hours --2 g sodium restricted diet --Daily weights, strict I's and O's  Hypokalemia and hypomagnesemia. Persistent hypokalemia, K down to 3.0; likely from continued IV diuresis  --continue aggressive potassium replacement with 120 meq in 3 divided doses.  --Daily BMP with magnesium  Hypertension. --Amlodipine discontinued as can exacerbate lower extremity edema --Continue metoprolol 100 mg p.o. twice daily; BP well controlled --Continue to monitor BP closely  Depression --Continue duloxetine, --added as needed alprazolam for anxiety and ambien at night for sleep.   Obesity BMI is 36,9, will need outpatient follow up.   Bilateral lower extremity wounds/ no deep cellulitis.  Clinically predominately superficial, discontinued IV antibiotic therapy. Noted elevated lactic acid, likely B lactic acid. No clinical signs of sepsis. --Wound care consulted for further recommendations, appreciate assistance --continue to monitor patient off IV antibiotic therapy; as he did have oral antibiotic therapy with MRSA coverage as outpatient with no change in his symptoms.   T2DM On metformin outpatient.  --Hold oral hypoglycemics while inpatient --Continue insulin sliding scale for coverage   DVT prophylaxis: Lovenox Code Status: Full code Family Communication: None Disposition Plan: Anticipate discharge home in 1-2 days; PT eval pending for discharge needs   Consultants:   None  Procedures:   Bilateral venous duplex ultrasound: Summary: Right: There is no evidence of deep vein thrombosis in the lower extremity. However, portions of this examination were limited- see technologist comments above. Left: There is no evidence of deep vein thrombosis in the lower  extremity. However, portions of this examination were limited- see technologist comments above.  Transthoracic echocardiogram 01/30/2019: IMPRESSIONS 1. The left ventricle has normal systolic function with  an ejection fraction of 60-65%. The cavity size was normal. Left ventricular diastolic Doppler parameters are consistent with impaired relaxation.  2. The right ventricle has normal systolic function. The cavity was normal.  3. The mitral valve is grossly normal.  4. The tricuspid valve is grossly normal.  5. The aortic valve was not well visualized. Mild thickening of the aortic valve.  6. Extremely limited; pt declined definity; normal LV systolic function; cannot R/O wall motion abnormality; mild diastolic dysfunction.    Antimicrobials:   Vancomycin 4/13 - 4/14  Zosyn 4/13 -4/14   Subjective: Patient seen and examined at bedside, lower extremity edema improving.  Continues with difficulty on ambulation.  No other complaints at this time.  Denies headache, no visual changes, no chest pain, no palpitations, no nausea/vomiting/diarrhea, no fever/chills/night sweats, no abdominal pain, no issues with bowel/bladder function, no paresthesias.  No acute events overnight per nursing staff.  Objective: Vitals:   01/30/19 0428 01/30/19 1537 01/30/19 2118 01/31/19 0606  BP: 110/71 124/69 122/79 116/66  Pulse: 85 75 87 77  Resp: 20 18 18 18   Temp: 99.4 F (37.4 C) 98.1 F (36.7 C) 98 F (36.7 C) 98.1 F (36.7 C)  TempSrc: Oral Oral Oral Oral  SpO2: 97% 93% 96% 92%  Weight:      Height:        Intake/Output Summary (Last 24 hours) at 01/31/2019 1259 Last data filed at 01/31/2019 0953 Gross per 24 hour  Intake 355 ml  Output 500 ml  Net -145 ml   Filed Weights   01/29/19 1541  Weight: 127 kg    Examination:  General exam: Appears calm and comfortable  Respiratory system: Clear to auscultation. Respiratory effort normal. Cardiovascular system: S1 & S2 heard, RRR. No JVD,  murmurs, rubs, gallops or clicks. No pedal edema. Gastrointestinal system: Abdomen is nondistended, soft and nontender. No organomegaly or masses felt. Normal bowel sounds heard. Central nervous system: Alert and oriented. No focal neurological deficits. Extremities: Symmetric 5 x 5 power. Skin: 2+ pitting edema bilateral lower extremities up to knee, with surrounding circumferential erythema and mild weeping Psychiatry: Judgement and insight appear normal. Mood & affect appropriate.     Data Reviewed: I have personally reviewed following labs and imaging studies  CBC: Recent Labs  Lab 01/29/19 1614 01/30/19 0419 01/31/19 0623  WBC 8.2 6.8 6.5  NEUTROABS 5.8  --  4.2  HGB 10.8* 10.2* 10.3*  HCT 31.6* 30.9* 31.7*  MCV 115.8* 115.3* 120.1*  PLT 207 179 126*   Basic Metabolic Panel: Recent Labs  Lab 01/29/19 1614 01/30/19 0419 01/31/19 0623  NA 135 137 138  K 2.4* 2.4* 3.0*  CL 96* 101 102  CO2 21* 23 24  GLUCOSE 137* 124* 101*  BUN <5* <5* <5*  CREATININE 0.76 0.68 0.61  CALCIUM 7.8* 7.2* 7.1*  MG  --  1.1* 1.3*   GFR: Estimated Creatinine Clearance: 142.2 mL/min (by C-G formula based on SCr of 0.61 mg/dL). Liver Function Tests: No results for input(s): AST, ALT, ALKPHOS, BILITOT, PROT, ALBUMIN in the last 168 hours. No results for input(s): LIPASE, AMYLASE in the last 168 hours. No results for input(s): AMMONIA in the last 168 hours. Coagulation Profile: No results for input(s): INR, PROTIME in the last 168 hours. Cardiac Enzymes: No results for input(s): CKTOTAL, CKMB, CKMBINDEX, TROPONINI in the last 168 hours. BNP (last 3 results) No results for input(s): PROBNP in the last 8760 hours. HbA1C: No results for input(s): HGBA1C in the  last 72 hours. CBG: Recent Labs  Lab 01/30/19 1151 01/30/19 1803 01/30/19 2115 01/31/19 0807 01/31/19 1141  GLUCAP 127* 114* 114* 92 135*   Lipid Profile: No results for input(s): CHOL, HDL, LDLCALC, TRIG, CHOLHDL,  LDLDIRECT in the last 72 hours. Thyroid Function Tests: No results for input(s): TSH, T4TOTAL, FREET4, T3FREE, THYROIDAB in the last 72 hours. Anemia Panel: No results for input(s): VITAMINB12, FOLATE, FERRITIN, TIBC, IRON, RETICCTPCT in the last 72 hours. Sepsis Labs: Recent Labs  Lab 01/29/19 1614 01/29/19 1919 01/29/19 2321 01/30/19 0757  LATICACIDVEN 7.2* 7.5* 6.8* 3.0*    Recent Results (from the past 240 hour(s))  Culture, blood (routine x 2)     Status: None (Preliminary result)   Collection Time: 01/29/19  4:14 PM  Result Value Ref Range Status   Specimen Description   Final    BLOOD RIGHT HAND Performed at [facility], [address] [place], [place] [address]    Special Requests   Final    BOTTLES DRAWN AEROBIC AND ANAEROBIC Blood Culture adequate volume Performed at [facility], [address] [place], [place] [address]    Culture   Final    NO GROWTH 2 DAYS Performed at [facility], [address] [place], [place] [address]    Report Status PENDING  Incomplete  Culture, blood (routine x 2)     Status: None (Preliminary result)   Collection Time: 01/29/19  4:14 PM  Result Value Ref Range Status   Specimen Description   Final    RIGHT ANTECUBITAL Performed at [facility], [address] [place], [place] [address]    Special Requests   Final    BOTTLES DRAWN AEROBIC AND ANAEROBIC Blood Culture adequate volume Performed at [facility], [address], [place], [place] [address]    Culture   Final    NO GROWTH 2 DAYS Performed at [facility], [address] [place], [place] [address]    Report Status PENDING  Incomplete         Radiology Studies: Vas [place] Lower Extremity Venous (dvt) (only [facility])  Result Date: 01/29/2019  Lower Venous Study Indications: Edema, Erythema, and Open wounds, weeping.  Limitations: Body habitus, open wound and edema, skin  texture. Comparison Study: No prior LEV on file Performing Technologist: Sherren Kerns RVS  Examination Guidelines: A complete evaluation includes B-mode imaging, spectral Doppler, color Doppler, and power Doppler as needed of all accessible portions of each vessel. Bilateral testing is considered an integral part of a complete examination. Limited examinations for reoccurring indications may be performed as noted.  Right Venous Findings: +---------+---------------+---------+-----------+----------+--------------+            Compressibility Phasicity Spontaneity Properties Summary         +---------+---------------+---------+-----------+----------+--------------+  CFV       Full                                                             +---------+---------------+---------+-----------+----------+--------------+  SFJ       Full                                                             +---------+---------------+---------+-----------+----------+--------------+  FV Prox   Full                                                             +---------+---------------+---------+-----------+----------+--------------+  FV Mid    Full                                                             +---------+---------------+---------+-----------+----------+--------------+  FV Distal Full                                                             +---------+---------------+---------+-----------+----------+--------------+  PFV       Full                                                             +---------+---------------+---------+-----------+----------+--------------+  POP       Full                                                             +---------+---------------+---------+-----------+----------+--------------+  PTV                                                        Not visualized  +---------+---------------+---------+-----------+----------+--------------+  PERO                                                        Not visualized  +---------+---------------+---------+-----------+----------+--------------+  GSV       Full                                                             +---------+---------------+---------+-----------+----------+--------------+  Right Technical Findings: Not visualized segments include Posterior tibial and peroneal veins.  Left Venous Findings: +---------+---------------+---------+-----------+----------+--------------+            Compressibility Phasicity Spontaneity Properties Summary         +---------+---------------+---------+-----------+----------+--------------+  CFV       Full            Yes  Yes                                    +---------+---------------+---------+-----------+----------+--------------+  SFJ       Full                                                             +---------+---------------+---------+-----------+----------+--------------+  FV Prox   Full                                                             +---------+---------------+---------+-----------+----------+--------------+  FV Mid                    Yes       Yes                    visualized      +---------+---------------+---------+-----------+----------+--------------+  FV Distal                 Yes       Yes                    visualized      +---------+---------------+---------+-----------+----------+--------------+  PFV       Full                                                             +---------+---------------+---------+-----------+----------+--------------+  POP       Full                                                             +---------+---------------+---------+-----------+----------+--------------+  PTV                                                        Not visualized  +---------+---------------+---------+-----------+----------+--------------+  PERO                                                       Not visualized   +---------+---------------+---------+-----------+----------+--------------+  GSV       Full                                                             +---------+---------------+---------+-----------+----------+--------------+  Left Technical Findings: Not visualized segments include posterior tibial and peroneal veins.   Summary: Right: There is no evidence of deep vein thrombosis in the lower extremity. However, portions of this examination were limited- see technologist comments above. Left: There is no evidence of deep vein thrombosis in the lower extremity. However, portions of this examination were limited- see technologist comments above.  *See table(s) above for measurements and observations. Electronically signed by Sherald Hess MD on 01/29/2019 at 5:18:51 PM.    Final         Scheduled Meds:  aspirin  81 mg Oral BID   diphenhydrAMINE  25 mg Oral Daily   DULoxetine  20 mg Oral QODAY   enoxaparin (LOVENOX) injection  60 mg Subcutaneous Q24H   furosemide  60 mg Intravenous Q12H   hydrocerin   Topical Daily   insulin aspart  0-9 Units Subcutaneous TID WC   metoprolol tartrate  100 mg Oral BID   multivitamin with minerals  1 tablet Oral Daily   potassium chloride  40 mEq Oral Q4H   senna  1 tablet Oral BID   Continuous Infusions:  sodium chloride 250 mL (01/31/19 0836)     LOS: 2 days    Time spent: 30 minutes    Alvira Philips [place], DO [facility] Pager [phone]  If 7PM-7AM, please contact night-coverage [email or web address] Password [facility] 01/31/2019, 12:59 PM

## 2019-02-01 LAB — BASIC METABOLIC PANEL
Anion gap: 11 (ref 5–15)
BUN: 5 mg/dL — ABNORMAL LOW (ref 6–20)
CO2: 27 mmol/L (ref 22–32)
Calcium: 7.1 mg/dL — ABNORMAL LOW (ref 8.9–10.3)
Chloride: 100 mmol/L (ref 98–111)
Creatinine, Ser: 0.7 mg/dL (ref 0.61–1.24)
GFR calc Af Amer: >60 mL/min (ref >60)
GFR calc non Af Amer: >60 mL/min (ref >60)
Glucose, Bld: 90 mg/dL (ref 70–99)
Potassium: 2.8 mmol/L — ABNORMAL LOW (ref 3.5–5.1)
Sodium: 138 mmol/L (ref 135–145)

## 2019-02-01 LAB — GLUCOSE, CAPILLARY
Glucose-Capillary: 123 mg/dL — ABNORMAL HIGH (ref 70–99)
Glucose-Capillary: 97 mg/dL (ref 70–99)
Glucose-Capillary: 106 mg/dL — ABNORMAL HIGH (ref 70–99)
Glucose-Capillary: 100 mg/dL — ABNORMAL HIGH (ref 70–99)

## 2019-02-01 LAB — MAGNESIUM: Magnesium: 1.4 mg/dL — ABNORMAL LOW (ref 1.7–2.4)

## 2019-02-01 MED ORDER — TEMAZEPAM 7.5 MG PO CAPS
7.5000 mg | ORAL_CAPSULE | Freq: Every evening | ORAL | Status: DC | PRN
  Administered 2019-02-01: 7.5 mg via ORAL
  Filled 2019-02-01: qty 1

## 2019-02-01 MED ORDER — POTASSIUM CHLORIDE CRYS ER 20 MEQ PO TBCR
40.0000 meq | EXTENDED_RELEASE_TABLET | ORAL | Status: AC
  Administered 2019-02-01 (×4): 40 meq via ORAL
  Filled 2019-02-01 (×4): qty 2

## 2019-02-01 MED ORDER — MAGNESIUM SULFATE 2 GM/50ML IV SOLN
2.0000 g | INTRAVENOUS | Status: AC
  Administered 2019-02-01 (×2): 2 g via INTRAVENOUS
  Filled 2019-02-01 (×2): qty 50

## 2019-02-01 NOTE — Evaluation (Signed)
Physical Therapy Evaluation Patient Details Name: [name] MRN: [id number] DOB: February 07, 1961 Today's Date: 02/01/2019   History of Present Illness  Pt admitted with SOB and LE edema 2* acute on chronic CHF.  Pt with hx of bilat TKR and bilat THR and DM  Clinical Impression  Pt admitted as above and presenting with functional mobility limitations 2* limited endurance, generalized weakness and mild ambulatory balance deficits.  Pt should progress to dc home with assist of family.    Follow Up Recommendations No PT follow up    Equipment Recommendations  None recommended by PT    Recommendations for Other Services       Precautions / Restrictions Restrictions Weight Bearing Restrictions: No      Mobility  Bed Mobility Overal bed mobility: Modified Independent Bed Mobility: Supine to Sit     Supine to sit: Modified independent (Device/Increase time)     General bed mobility comments: Pt unassisted to sit EOB  Transfers Overall transfer level: Needs assistance Equipment used: Rolling walker (2 wheeled) Transfers: Sit to/from Stand Sit to Stand: Min guard         General transfer comment: cues for transition position and use of UEs  Ambulation/Gait Ambulation/Gait assistance: Min guard Gait Distance (Feet): 200 Feet Assistive device: Rolling walker (2 wheeled) Gait Pattern/deviations: Step-through pattern;Decreased step length - right;Decreased step length - left;Shuffle;Trunk flexed     General Gait Details: min cues for posture, pace and position from [employer]            Wheelchair Mobility    Modified Rankin (Stroke Patients Only)       Balance Overall balance assessment: Mild deficits observed, not formally tested                                           Pertinent Vitals/Pain Pain Assessment: No/denies pain    Home Living Family/patient expects to be discharged to:: Private residence Living Arrangements:  Spouse/significant other Available Help at Discharge: Family;Available PRN/intermittently Type of Home: House Home Access: Stairs to enter Entrance Stairs-Rails: Right;Left Entrance Stairs-Number of Steps: 2 Home Layout: Able to live on main level with bedroom/bathroom;Two level Home Equipment: Walker - 4 wheels;Crutches;Bedside commode;Walker - standard;Walker - 2 wheels      Prior Function Level of Independence: Independent with assistive device(s)         Comments: Pt using rollator prior to admit     Hand Dominance   Dominant Hand: Left    Extremity/Trunk Assessment   Upper Extremity Assessment Upper Extremity Assessment: Generalized weakness    Lower Extremity Assessment Lower Extremity Assessment: Generalized weakness       Communication   Communication: No difficulties  Cognition Arousal/Alertness: Awake/alert Behavior During Therapy: WFL for tasks assessed/performed Overall Cognitive Status: Within Functional Limits for tasks assessed                                        General Comments      Exercises     Assessment/Plan    PT Assessment Patient needs continued PT services  PT Problem List Decreased strength;Decreased activity tolerance;Decreased balance;Decreased mobility;Decreased knowledge of use of DME;Obesity       PT Treatment Interventions DME instruction;Gait training;Stair training;Functional mobility training;Therapeutic activities;Therapeutic exercise;Patient/family education    PT  Goals (Current goals can be found in the Care Plan section)  Acute Rehab PT Goals Patient Stated Goal: Regain IND PT Goal Formulation: With patient Time For Goal Achievement: 02/15/19 Potential to Achieve Goals: Good    Frequency Min 3X/week   Barriers to discharge        Co-evaluation               AM-PAC PT "6 Clicks" Mobility  Outcome Measure Help needed turning from your back to your side while in a flat bed without  using bedrails?: None Help needed moving from lying on your back to sitting on the side of a flat bed without using bedrails?: None Help needed moving to and from a bed to a chair (including a wheelchair)?: A Little Help needed standing up from a chair using your arms (e.g., wheelchair or bedside chair)?: A Little Help needed to walk in hospital room?: A Little Help needed climbing 3-5 steps with a railing? : A Little 6 Click Score: 20    End of Session   Activity Tolerance: Patient tolerated treatment well Patient left: in chair;with call bell/phone within reach;with nursing/sitter in room Nurse Communication: Mobility status PT Visit Diagnosis: Difficulty in walking, not elsewhere classified (R26.2);Muscle weakness (generalized) (M62.81)    Time: [id number] PT Time Calculation (min) (ACUTE ONLY): 16 min   Charges:   PT Evaluation $PT Eval Low Complexity: 1 Low          Mauro KaufmannHunter Ilyana Manuele PT Acute Rehabilitation Services Pager [phone] Office [phone]   Kassem Kibbe 02/01/2019, 12:57 PM

## 2019-02-01 NOTE — Progress Notes (Signed)
Pt had 7 beats of y complex svt. Rn assessed pt at bedside, pt asymptomatic and sleeping.  Provider notified.

## 2019-02-01 NOTE — Progress Notes (Signed)
PROGRESS NOTE    [name]  CXF:[id number] DOB: 1960-10-25 DOA: 01/29/2019 PCP: [name], MD    Brief Narrative:   58 year old male who has hypertension, type 2 diabetes mellitus and obesity who presents with 3 weeks of worsening lower extremity erythematous edema, it has been refractive to outpatient therapyto anincreased dose of furosemide and oralantibiotics.Symptoms tend to be worse when standing,patient denied any fevers, angina or dyspnea.   On his initial physical examination his blood pressure is 144/67, temperature 97.9, heart rate 84, respirate 18, oxygen saturation 98% on room,he has mild pallor, his lungs are clear to auscultation bilaterally, heart S1-S2 present rhythmic, abdomen protuberant nontender, +4+ lower extremity edema, positive blanching erythema bilaterally, no purulence. No increased local temperature. Sodium 135, potassium 2.4, chloride 96, bicarb 21, glucose 137, BUN less than 5, creatinine 0.76, white count 8.2, hemoglobin 10.8, hematocrit 31.6, platelets 207.Ultrasonography of the lower extremities negative for deep vein thrombosis.  Patient will be admitted to the hospital with working diagnosis of worsening bilateral extremity edema,rule out persistent cellulitis, not vascular insufficiency, heart failure  Assessment & Plan:   Active Problems:   Hypertension   Morbid obesity (HCC)   Cellulitis   Type 2 diabetes mellitus with hyperlipidemia (HCC)   Acute on chronic diastolic CHF (congestive heart failure) (HCC)   Acute on chronic diastolic congestive heart failure Patient presenting with progressive lower extremity edema with bilateral erythematous changes thought to be initially secondary to cellulitis.  Patient has been progressing despite increased dose of furosemide and oral antibiotics outpatient.  Echocardiogram 01/30/2019 notable for EF 60-65% with diastolic dysfunction.  Bilateral lower extremity duplex ultrasound negative for DVT.   Patient is afebrile without leukocytosis. --net negative past 24hrs; net negative 3.7 L during hospitalization --Continue metoprolol 100 mg p.o. twice daily --Continue diuresis with furosemide 60 mg IV every 12 hours --2 g sodium restricted diet --Daily weights, strict I's and O's  Hypokalemia and hypomagnesemia. Persistent hypokalemia, K down to 2.8; Mg 1.4; likely from continued IV diuresis  --continue aggressive potassium replacement with 160 meq in 4 divided doses.  --replace magnesium with 2g IV x 2 doses --Daily BMP with magnesium  Hypertension. --Amlodipine discontinued as can exacerbate lower extremity edema --Continue metoprolol 100 mg p.o. twice daily; BP well controlled --Continue to monitor BP closely  Depression --Continue duloxetine --added as needed alprazolam for anxiety and ambien at night for sleep.   Obesity BMI is 36,9, will need outpatient follow up.   Bilateral lower extremity wounds/ no deep cellulitis.  Clinically predominately superficial, discontinued IV antibiotic therapy. Noted elevated lactic acid, likely B lactic acid. No clinical signs of sepsis. --Wound care consulted for further recommendations, appreciate assistance --continue to monitor patient off IV antibiotic therapy; as he did have oral antibiotic therapy with MRSA coverage as outpatient with no change in his symptoms.   T2DM On metformin outpatient.  --Hold oral hypoglycemics while inpatient --Continue insulin sliding scale for coverage   DVT prophylaxis: Lovenox Code Status: Full code Family Communication: None Disposition Plan: Anticipate discharge home in 1-2 days; PT eval pending for discharge needs   Consultants:   None  Procedures:   Bilateral venous duplex ultrasound: Summary: Right: There is no evidence of deep vein thrombosis in the lower extremity. However, portions of this examination were limited- see technologist comments above. Left: There is no  evidence of deep vein thrombosis in the lower extremity. However, portions of this examination were limited- see technologist comments above.  Transthoracic echocardiogram 01/30/2019:  IMPRESSIONS 1. The left ventricle has normal systolic function with an ejection fraction of 60-65%. The cavity size was normal. Left ventricular diastolic Doppler parameters are consistent with impaired relaxation.  2. The right ventricle has normal systolic function. The cavity was normal.  3. The mitral valve is grossly normal.  4. The tricuspid valve is grossly normal.  5. The aortic valve was not well visualized. Mild thickening of the aortic valve.  6. Extremely limited; pt declined definity; normal LV systolic function; cannot R/O wall motion abnormality; mild diastolic dysfunction.    Antimicrobials:   Vancomycin 4/13 - 4/14  Zosyn 4/13 -4/14   Subjective: Patient seen and examined at bedside, lower extremity edema improving.  Concerned about bilateral erythema, discussed with him once again this is related to his venous stasis.  Requesting sleep aid at night.  Denies headache, no visual changes, no chest pain, no palpitations, no nausea/vomiting/diarrhea, no fever/chills/night sweats, no abdominal pain, no issues with bowel/bladder function, no paresthesias.  No acute events overnight per nursing staff.  Objective: Vitals:   01/31/19 0606 01/31/19 1337 01/31/19 2045 02/01/19 0619  BP: 116/66 (!) 114/57 119/68 (!) 123/49  Pulse: 77 71 76 74  Resp: 18 18 19 17   Temp: 98.1 F (36.7 C) 98.7 F (37.1 C) 98.4 F (36.9 C) 98.6 F (37 C)  TempSrc: Oral Oral Oral Oral  SpO2: 92% 95% 96% 92%  Weight:      Height:        Intake/Output Summary (Last 24 hours) at 02/01/2019 1153 Last data filed at 02/01/2019 0945 Gross per 24 hour  Intake 493.47 ml  Output 900 ml  Net -406.53 ml   Filed Weights   01/29/19 1541  Weight: 127 kg    Examination:  General exam: Appears calm and comfortable   Respiratory system: Clear to auscultation. Respiratory effort normal. Cardiovascular system: S1 & S2 heard, RRR. No JVD, murmurs, rubs, gallops or clicks. No pedal edema. Gastrointestinal system: Abdomen is nondistended, soft and nontender. No organomegaly or masses felt. Normal bowel sounds heard. Central nervous system: Alert and oriented. No focal neurological deficits. Extremities: Symmetric 5 x 5 power. Skin: 2+ pitting edema bilateral lower extremities up to knee, with surrounding circumferential erythema and mild weeping Psychiatry: Judgement and insight appear normal. Mood & affect appropriate.     Data Reviewed: I have personally reviewed following labs and imaging studies  CBC: Recent Labs  Lab 01/29/19 1614 01/30/19 0419 01/31/19 0623  WBC 8.2 6.8 6.5  NEUTROABS 5.8  --  4.2  HGB 10.8* 10.2* 10.3*  HCT 31.6* 30.9* 31.7*  MCV 115.8* 115.3* 120.1*  PLT 207 179 126*   Basic Metabolic Panel: Recent Labs  Lab 01/29/19 1614 01/30/19 0419 01/31/19 0623 02/01/19 0526  NA 135 137 138 138  K 2.4* 2.4* 3.0* 2.8*  CL 96* 101 102 100  CO2 21* 23 24 27   GLUCOSE 137* 124* 101* 90  BUN <5* <5* <5* 5*  CREATININE 0.76 0.68 0.61 0.70  CALCIUM 7.8* 7.2* 7.1* 7.1*  MG  --  1.1* 1.3* 1.4*   GFR: Estimated Creatinine Clearance: 142.2 mL/min (by C-G formula based on SCr of 0.7 mg/dL). Liver Function Tests: No results for input(s): AST, ALT, ALKPHOS, BILITOT, PROT, ALBUMIN in the last 168 hours. No results for input(s): LIPASE, AMYLASE in the last 168 hours. No results for input(s): AMMONIA in the last 168 hours. Coagulation Profile: No results for input(s): INR, PROTIME in the last 168 hours. Cardiac Enzymes: No results  for input(s): CKTOTAL, CKMB, CKMBINDEX, TROPONINI in the last 168 hours. BNP (last 3 results) No results for input(s): PROBNP in the last 8760 hours. HbA1C: No results for input(s): HGBA1C in the last 72 hours. CBG: Recent Labs  Lab 01/31/19 1141  01/31/19 1645 01/31/19 2042 02/01/19 0952 02/01/19 1138  GLUCAP 135* 105* 120* 123* 97   Lipid Profile: No results for input(s): CHOL, HDL, LDLCALC, TRIG, CHOLHDL, LDLDIRECT in the last 72 hours. Thyroid Function Tests: No results for input(s): TSH, T4TOTAL, FREET4, T3FREE, THYROIDAB in the last 72 hours. Anemia Panel: No results for input(s): VITAMINB12, FOLATE, FERRITIN, TIBC, IRON, RETICCTPCT in the last 72 hours. Sepsis Labs: Recent Labs  Lab 01/29/19 1614 01/29/19 1919 01/29/19 2321 01/30/19 0757  LATICACIDVEN 7.2* 7.5* 6.8* 3.0*    Recent Results (from the past 240 hour(s))  Culture, blood (routine x 2)     Status: None (Preliminary result)   Collection Time: 01/29/19  4:14 PM  Result Value Ref Range Status   Specimen Description   Final    BLOOD RIGHT HAND Performed at [facility], [address], [place], [place] [address]    Special Requests   Final    BOTTLES DRAWN AEROBIC AND ANAEROBIC Blood Culture adequate volume Performed at [facility], [address] [place], [place] [address]    Culture   Final    NO GROWTH 3 DAYS Performed at [facility], [address] [place], [place] [address]    Report Status PENDING  Incomplete  Culture, blood (routine x 2)     Status: None (Preliminary result)   Collection Time: 01/29/19  4:14 PM  Result Value Ref Range Status   Specimen Description   Final    RIGHT ANTECUBITAL Performed at [facility], [address] [place], [place] [address]    Special Requests   Final    BOTTLES DRAWN AEROBIC AND ANAEROBIC Blood Culture adequate volume Performed at [facility], [address], [place], [place] [address]    Culture   Final    NO GROWTH 3 DAYS Performed at [facility], [address], [place], [place] [address]    Report Status PENDING  Incomplete         Radiology Studies: No results found.      Scheduled  Meds: . aspirin  81 mg Oral BID  . diphenhydrAMINE  25 mg Oral Daily  . DULoxetine  20 mg Oral QODAY  . enoxaparin (LOVENOX) injection  60 mg Subcutaneous Q24H  . furosemide  60 mg Intravenous Q12H  . hydrocerin   Topical Daily  . insulin aspart  0-9 Units Subcutaneous TID WC  . metoprolol tartrate  100 mg Oral BID  . multivitamin with minerals  1 tablet Oral Daily  . potassium chloride  40 mEq Oral Q4H  . senna  1 tablet Oral BID   Continuous Infusions: . sodium chloride Stopped (01/31/19 1100)  . magnesium sulfate 1 - 4 g bolus IVPB 2 g (02/01/19 0900)     LOS: 3 days    Time spent: 30 minutes    Alvira Philips [place], DO [facility] Pager [phone]  If 7PM-7AM, please contact night-coverage [email or web address] Password [facility] 02/01/2019, 11:53 AM

## 2019-02-01 NOTE — Progress Notes (Signed)
Physical Therapy Treatment Patient Details Name: [name] Mineau MRN: [id number] DOB: 11/11/1960 Today's Date: 02/01/2019    History of Present Illness Pt admitted with SOB and LE edema 2* acute on chronic CHF.  Pt with hx of bilat TKR and bilat THR and DM    PT Comments    Improving activity tolerance.  Pt hopeful for dc home tomorrow.   Follow Up Recommendations  No PT follow up     Equipment Recommendations  None recommended by PT    Recommendations for Other Services       Precautions / Restrictions Restrictions Weight Bearing Restrictions: No    Mobility  Bed Mobility Overal bed mobility: Modified Independent Bed Mobility: Supine to Sit     Supine to sit: Modified independent (Device/Increase time)     General bed mobility comments: Pt unassisted to sit EOB  Transfers Overall transfer level: Needs assistance Equipment used: Rolling walker (2 wheeled) Transfers: Sit to/from Stand Sit to Stand: Supervision         General transfer comment: cues for transition position and use of UEs  Ambulation/Gait Ambulation/Gait assistance: Min guard;Supervision Gait Distance (Feet): 320 Feet Assistive device: Rolling walker (2 wheeled) Gait Pattern/deviations: Step-through pattern;Decreased step length - right;Decreased step length - left;Shuffle;Trunk flexed     General Gait Details: min cues for posture, pace and position from [employer]   Stairs             Wheelchair Mobility    Modified Rankin (Stroke Patients Only)       Balance Overall balance assessment: Mild deficits observed, not formally tested                                          Cognition Arousal/Alertness: Awake/alert Behavior During Therapy: WFL for tasks assessed/performed Overall Cognitive Status: Within Functional Limits for tasks assessed                                        Exercises      General Comments        Pertinent Vitals/Pain  Pain Assessment: No/denies pain    Home Living                      Prior Function            PT Goals (current goals can now be found in the care plan section) Acute Rehab PT Goals Patient Stated Goal: Regain IND PT Goal Formulation: With patient Time For Goal Achievement: 02/15/19 Potential to Achieve Goals: Good Progress towards PT goals: Progressing toward goals    Frequency    Min 3X/week      PT Plan Current plan remains appropriate    Co-evaluation              AM-PAC PT "6 Clicks" Mobility   Outcome Measure  Help needed turning from your back to your side while in a flat bed without using bedrails?: None Help needed moving from lying on your back to sitting on the side of a flat bed without using bedrails?: None Help needed moving to and from a bed to a chair (including a wheelchair)?: None Help needed standing up from a chair using your arms (e.g., wheelchair or bedside chair)?: A Little Help needed to walk  in hospital room?: A Little Help needed climbing 3-5 steps with a railing? : A Little 6 Click Score: 21    End of Session   Activity Tolerance: Patient tolerated treatment well Patient left: Other (comment)(sitting EOB) Nurse Communication: Mobility status PT Visit Diagnosis: Difficulty in walking, not elsewhere classified (R26.2);Muscle weakness (generalized) (M62.81)     Time: 3832-9191 PT Time Calculation (min) (ACUTE ONLY): 15 min  Charges:  $Gait Training: 8-22 mins                     [name] PT Acute Rehabilitation Services Pager [phone] Office [phone]    Allee Busk 02/01/2019, 4:57 PM

## 2019-02-02 LAB — BASIC METABOLIC PANEL
Anion gap: 11 (ref 5–15)
BUN: 6 mg/dL (ref 6–20)
CO2: 26 mmol/L (ref 22–32)
Calcium: 7 mg/dL — ABNORMAL LOW (ref 8.9–10.3)
Chloride: 100 mmol/L (ref 98–111)
Creatinine, Ser: 0.75 mg/dL (ref 0.61–1.24)
GFR calc Af Amer: >60 mL/min (ref >60)
GFR calc non Af Amer: >60 mL/min (ref >60)
Glucose, Bld: 86 mg/dL (ref 70–99)
Potassium: 3.6 mmol/L (ref 3.5–5.1)
Sodium: 137 mmol/L (ref 135–145)

## 2019-02-02 LAB — CBC
HCT: 29.3 % — ABNORMAL LOW (ref 39.0–52.0)
Hemoglobin: 9.7 g/dL — ABNORMAL LOW (ref 13.0–17.0)
MCH: 39.3 pg — ABNORMAL HIGH (ref 26.0–34.0)
MCHC: 33.1 g/dL (ref 30.0–36.0)
MCV: 118.6 fL — ABNORMAL HIGH (ref 80.0–100.0)
Platelets: 154 10*3/uL (ref 150–400)
RBC: 2.47 MIL/uL — ABNORMAL LOW (ref 4.22–5.81)
RDW: 17.4 % — ABNORMAL HIGH (ref 11.5–15.5)
WBC: 6.5 10*3/uL (ref 4.0–10.5)
nRBC: 0 % (ref 0.0–0.2)

## 2019-02-02 LAB — GLUCOSE, CAPILLARY
Glucose-Capillary: 72 mg/dL (ref 70–99)
Glucose-Capillary: 106 mg/dL — ABNORMAL HIGH (ref 70–99)

## 2019-02-02 LAB — MAGNESIUM: Magnesium: 1.8 mg/dL (ref 1.7–2.4)

## 2019-02-02 MED ORDER — POTASSIUM CHLORIDE CRYS ER 20 MEQ PO TBCR: 40.0000 meq | EXTENDED_RELEASE_TABLET | Freq: Two times a day (BID) | ORAL | 0 refills | Status: DC

## 2019-02-02 MED ORDER — FUROSEMIDE 40 MG PO TABS: 40.0000 mg | ORAL_TABLET | Freq: Two times a day (BID) | ORAL | 0 refills | Status: DC

## 2019-02-02 MED ORDER — POTASSIUM CHLORIDE CRYS ER 20 MEQ PO TBCR
40.0000 meq | EXTENDED_RELEASE_TABLET | Freq: Once | ORAL | Status: AC
  Administered 2019-02-02: 09:00:00 40 meq via ORAL
  Filled 2019-02-02: qty 2

## 2019-02-02 NOTE — Discharge Instructions (Signed)
Preventing Heart Failure Heart failure is a condition in which the heart has trouble pumping blood. This may mean that the heart cannot pump enough blood out to the body, or that the heart does not fill up with enough blood. Either of those problems can lead to symptoms such as fatigue, trouble breathing, and swelling throughout the body. This is a common medical condition that affects not only the heart, but the entire body. Making certain nutrition and lifestyle changes can help you prevent heart failure and avoid serious health problems. What nutrition changes can be made?   If you are overweight or obese, reduce how many calories you eat each day so that you lose weight. Work with your health care provider or a diet and nutrition specialist (dietitian) to determine how many calories you need each day.  Eat foods that are low in salt (sodium). Avoid adding extra salt to foods.  Eat a well-balanced diet that includes a lot of: ? Fresh fruits and vegetables. ? Whole grains. ? Lean meats. ? Beans. ? Fat-free or low-fat dairy products.  Avoid foods that contain a lot of: ? Trans fats. ? Saturated fats. ? Sugar. ? Cholesterol. What lifestyle changes can be made?   Do not use any products that contain nicotine or tobacco, such as cigarettes and e-cigarettes. If you need help quitting or reducing how much you smoke, ask your health care provider.  Stop using alcohol, or limit alcohol intake to no more than 1 drink a day for nonpregnant women and 2 drinks a day for men. One drink equals 12 oz of beer, 5 oz of wine, or 1 oz of hard liquor.  Exercise for at least 150 minutes each week, or as much as told by your health care provider. ? Do moderate-intensity exercise, such as brisk walking, bicycling, or water aerobics. ? Ask your health care provider which activities are safe for you.  See a health care provider regularly for screening and wellness checks. Know your heart health  indicators, such as: ? Blood pressure. ? Cholesterol levels. ? Blood sugar (glucose) levels. ? Weight and BMI.  If you have diabetes, manage your condition and follow your treatment plan as instructed.  Try to get 7-9 hours of sleep each night. To help with sleep: ? Keep your bedroom cool and dark. ? Do not eat a heavy meal during the hour before you go to bed. ? Do not drink alcohol or caffeinated drinks before bed. ? Avoid screen time before bedtime. This means avoiding television, computers, tablets, and cell phones.  Find ways to relax and manage stress. These may include: ? Breathing exercises. ? Meditation. ? Yoga. ? Listening to music. Why are these changes important?  A well-balanced diet with the appropriate amount of calories can keep your body weight at a healthy level, which reduces strain on your heart.  A low-sodium diet can help keep your blood pressure in a normal range and keep your blood vessels working properly.  Quitting smoking and limiting alcohol intake can reduce harmful effects that these substances have on your heart and blood vessels.  Regular exercise can keep your heart strong so it can pump blood normally.  Managing diabetes helps your blood circulate and can help you maintain a healthy weight.  Managing stress helps to reduce the risk of high blood pressure and heart problems. What can happen if changes are not made? Heart failure can cause very serious problems that may get worse over time, such as:  Extreme fatigue during normal physical activities.  Shortness of breath or trouble breathing.  Swelling in your abdomen, legs, ankles, feet, or neck.  Fluid buildup throughout the body.  Weight gain.  Cough.  Frequent urination. What can I do to lower my risk? You may be able to lower your risk of heart failure by:  Losing weight or keeping your weight under control.  Working with your health care provider to manage  your: ? Cholesterol. ? Blood pressure. ? Diabetes, if this applies.  Eating a healthy diet.  Exercising regularly.  Avoiding unhealthy habits, such as smoking, drinking, or using drugs.  Getting plenty of sleep.  Managing your stress. How is this treated? Heart failure cannot be cured except by heart transplant, but treatment can help to improve your quality of life. Treatment may include:  Medicines to help: ? Lower blood pressure. ? Remove excess sodium from your body. ? Relax blood vessels. ? Improve heart function. ? Control other symptoms of heart failure.  Surgery to open blocked coronary arteries or repair damaged heart valves.  Implantation of a biventricular pacemaker to improve heart muscle function (cardiac resynchronization therapy). This device paces both the right ventricle and left ventricle.  Implantation of a device to treat serious abnormal heart rhythms (implantable cardioverter defibrillator, ICD).  Implantation of a mechanical heart pump to improve the pumping ability of your heart (left ventricular assist device, LVAD).  Heart transplant. This treatment is considered for certain people who do not improve with other treatments. Where to find more information  National Heart, Lung, and Blood Institute: PowderCalcium.fr  Centers for Disease Control and Prevention: https://www.franklin-jones.com/  [facility]: https://www.bailey.com/  American Heart Association: 1234567890.jsp Contact a health care provider if:  You have rapid weight gain.  You have increasing shortness of breath that is unusual for you.  You tire easily, or you are unable to participate in your usual activities.  You cough more than normal, especially with physical activity.  You have any swelling or  more swelling in areas such as your hands, feet, ankles, or abdomen. Summary  Heart failure can be prevented by making changes to your diet and your lifestyle.  It is important to eat a healthy diet, manage your weight, exercise regularly, manage stress, avoid drugs and alcohol, and keep your cholesterol and blood pressure under control.  Heart failure can cause very serious problems over time. This information is not intended to replace advice given to you by your health care provider. Make sure you discuss any questions you have with your health care provider. Document Released: 05/25/2016 Document Revised: 12/21/2016 Document Reviewed: 05/25/2016 Elsevier Interactive Patient Education  2019 [employer] Heart Failure Eating Plan Heart failure, also called congestive heart failure, occurs when your heart does not pump blood well enough to meet your body's needs for oxygen-rich blood. Heart failure is a long-term (chronic) condition. Living with heart failure can be challenging. However, following your health care provider's instructions about a healthy lifestyle and working with a diet and nutrition specialist (dietitian) to choose the right foods may help to improve your symptoms. What are tips for following this plan? General guidelines  Do not eat more than 2,300 mg of salt (sodium) a day. The amount of sodium that is recommended for you may be lower, depending on your condition.  Maintain a healthy body weight as directed. Ask your health care provider what a healthy weight is for you. ? Check your weight every day. ? Work with your  health care provider and dietitian to make a plan that is right for you to lose weight or maintain your current weight.  Limit how much fluid you drink. Ask your health care provider or dietitian how much fluid you can have each day.  Limit or avoid alcohol as told by your health care provider or dietitian. Reading food labels  Check food labels for the  amount of sodium per serving. Choose foods that have less than 140 mg (milligrams) of sodium in each serving.  Check food labels for the number of calories per serving. This is important if you need to limit your daily calorie intake to lose weight.  Check food labels for the serving size. If you eat more than one serving, you will be eating more sodium and calories than what is listed on the label.  Look for foods that are labeled as "sodium-free," "very low sodium," or "low sodium." ? Foods labeled as "reduced sodium" or "lightly salted" may still have more sodium than what is recommended for you. Cooking  Avoid adding salt when cooking. Ask your health care provider or dietitian before using salt substitutes.  Season food with salt-free seasonings, spices, or herbs. Check the label of seasoning mixes to make sure they do not contain salt.  Cook with heart-healthy oils, such as olive, canola, soybean, or sunflower oil.  Do not fry foods. Cook foods using low-fat methods, such as baking, boiling, grilling, and broiling.  Limit unhealthy fats when cooking by: ? Removing the skin from poultry, such as chicken. ? Removing all visible fats from meats. ? Skimming the fat off from stews, soups, and gravies before serving them. Meal planning   Limit your intake of: ? Processed, canned, or pre-packaged foods. ? Foods that are high in trans fat, such as fried foods. ? Sweets, desserts, sugary drinks, and other foods with added sugar. ? Full-fat dairy products, such as whole milk.  Eat a balanced diet that includes: ? 4-5 servings of fruit each day and 4-5 servings of vegetables each day. At each meal, try to fill half of your plate with fruits and vegetables. ? Up to 6-8 servings of whole grains each day. ? Up to 2 servings of lean meat, poultry, or fish each day. One serving of meat is equal to 3 oz. This is about the same size as a deck of cards. ? 2 servings of low-fat dairy each  day. ? Heart-healthy fats. Healthy fats called omega-3 fatty acids are found in foods such as flaxseed and cold-water fish like sardines, salmon, and mackerel.  Aim to eat 25-35 g (grams) of fiber a day. Foods that are high in fiber include apples, broccoli, carrots, beans, peas, and whole grains.  Do not add salt or condiments that contain salt (such as soy sauce) to foods before eating.  When eating at a restaurant, ask that your food be prepared with less salt or no salt, if possible.  Try to eat 2 or more vegetarian meals each week.  Eat more home-cooked food and eat less restaurant, buffet, and fast food. Recommended foods The items listed may not be a complete list. Talk with your dietitian about what dietary choices are best for you. Grains Bread with less than 80 mg of sodium per slice. Whole-wheat pasta, quinoa, and brown rice. Oats and oatmeal. Barley. Millet. Grits and cream of wheat. Whole-grain and whole-wheat cold cereal. Vegetables All fresh vegetables. Vegetables that are frozen without sauce or added salt. Low-sodium or sodium-free  canned vegetables. Fruits All fresh, frozen, and canned fruits. Dried fruits, such as raisins, prunes, and cranberries. Meats and other protein foods Lean cuts of meat. Skinless chicken and [place]. Fish with high omega-3 fatty acids, such as salmon, sardines, and other cold-water fishes. Eggs. Dried beans, peas, and edamame. Unsalted nuts and nut butters. Dairy Low-fat or nonfat (skim) milk and dried milk. Rice milk, soy milk, and almond milk. Low-fat or nonfat yogurt. Small amounts of reduced-sodium block cheese. Low-sodium cottage cheese. Fats and oils Olive, canola, soybean, flaxseed, or sunflower oil. Avocado. Sweets and desserts Apple sauce. Granola bars. Sugar-free pudding and gelatin. Frozen fruit bars. Seasoning and other foods Fresh and dried herbs. Lemon or lime juice. Vinegar. Low-sodium ketchup. Salt-free marinades, salad  dressings, sauces, and seasonings. Foods to avoid The items listed may not be a complete list. Talk with your dietitian about what dietary choices are best for you. Grains Bread with more than 80 mg of sodium per slice. Hot or cold cereal with more than 140 mg sodium per serving. Salted pretzels and crackers. Pre-packaged breadcrumbs. Bagels, croissants, and biscuits. Vegetables Canned vegetables. Frozen vegetables with sauce or seasonings. Creamed vegetables. [place] fries. Onion rings. Pickled vegetables and sauerkraut. Fruits Fruits that are dried with sodium-containing preservatives. Meats and other protein foods Ribs and chicken wings. Bacon, ham, pepperoni, bologna, salami, and packaged luncheon meats. Hot dogs, bratwurst, and sausage. Canned meat. Smoked meat and fish. Salted nuts and seeds. Dairy Whole milk, half-and-half, and cream. Buttermilk. Processed cheese, cheese spreads, and cheese curds. Regular cottage cheese. Feta cheese. Shredded cheese. String cheese. Fats and oils Butter, lard, shortening, ghee, and bacon fat. Canned and packaged gravies. Seasoning and other foods Onion salt, garlic salt, table salt, and sea salt. Marinades. Regular salad dressings. Relishes, pickles, and olives. Meat flavorings and tenderizers, and bouillon cubes. Horseradish, ketchup, and mustard. Worcestershire sauce. Teriyaki sauce, soy sauce (including reduced sodium). Hot sauce and Tabasco sauce. Steak sauce, fish sauce, oyster sauce, and cocktail sauce. Taco seasonings. Barbecue sauce. Tartar sauce. Summary  A heart failure eating plan includes changes that limit your intake of sodium and unhealthy fat, and it may help you lose weight or maintain a healthy weight. Your health care provider may also recommend limiting how much fluid you drink.  Most people with heart failure should eat no more than 2,300 mg of salt (sodium) a day. The amount of sodium that is recommended for you may be lower, depending  on your condition.  Contact your health care provider or dietitian before making any major changes to your diet. This information is not intended to replace advice given to you by your health care provider. Make sure you discuss any questions you have with your health care provider. Document Released: 02/18/2017 Document Revised: 02/18/2017 Document Reviewed: 02/18/2017 Elsevier Interactive Patient Education  2019 [employer] Inc. Chronic Venous Insufficiency Chronic venous insufficiency, also called venous stasis, is a condition that prevents blood from being pumped effectively through the veins in your legs. Blood may no longer be pumped effectively from the legs back to the heart. This condition can range from mild to severe. With proper treatment, you should be able to continue with an active life. What are the causes? Chronic venous insufficiency occurs when the vein walls become stretched, weakened, or damaged, or when valves within the vein are damaged. Some common causes of this include:  High blood pressure inside the veins (venous hypertension).  Increased blood pressure in the leg veins from long periods of  sitting or standing.  A blood clot that blocks blood flow in a vein (deep vein thrombosis, DVT).  Inflammation of a vein (phlebitis) that causes a blood clot to form.  Tumors in the pelvis that cause blood to back up. What increases the risk? The following factors may make you more likely to develop this condition:  Having a family history of this condition.  Obesity.  Pregnancy.  Living without enough physical activity or exercise (sedentary lifestyle).  Smoking.  Having a job that requires long periods of standing or sitting in one place.  Being a certain age. Women in their 37s and 59s and men in their 60s are more likely to develop this condition. What are the signs or symptoms? Symptoms of this condition include:  Veins that are enlarged, bulging, or twisted  (varicose veins).  Skin breakdown or ulcers.  Reddened or discolored skin on the front of the leg.  Brown, smooth, tight, and painful skin just above the ankle, usually on the inside of the leg (lipodermatosclerosis).  Swelling. How is this diagnosed? This condition may be diagnosed based on:  Your medical history.  A physical exam.  Tests, such as: ? A procedure that creates an image of a blood vessel and nearby organs and provides information about blood flow through the blood vessel (duplex ultrasound). ? A procedure that tests blood flow (plethysmography). ? A procedure to look at the veins using X-ray and dye (venogram). How is this treated? The goals of treatment are to help you return to an active life and to minimize pain or disability. Treatment depends on the severity of your condition, and it may include:  Wearing compression stockings. These can help relieve symptoms and help prevent your condition from getting worse. However, they do not cure the condition.  Sclerotherapy. This is a procedure involving an injection of a material that dissolves damaged veins.  Surgery. This may involve: ? Removing a diseased vein (vein stripping). ? Cutting off blood flow through the vein (laser ablation surgery). ? Repairing a valve. Follow these instructions at home:      Wear compression stockings as told by your health care provider. These stockings help to prevent blood clots and reduce swelling in your legs.  Take over-the-counter and prescription medicines only as told by your health care provider.  Stay active by exercising, walking, or doing different activities. Ask your health care provider what activities are safe for you and how much exercise you need.  Drink enough fluid to keep your urine clear or pale yellow.  Do not use any products that contain nicotine or tobacco, such as cigarettes and e-cigarettes. If you need help quitting, ask your health care  provider.  Keep all follow-up visits as told by your health care provider. This is important. Contact a health care provider if:  You have redness, swelling, or more pain in the affected area.  You see a red streak or line that extends up or down from the affected area.  You have skin breakdown or a loss of skin in the affected area, even if the breakdown is small.  You get an injury in the affected area. Get help right away if:  You get an injury and an open wound in the affected area.  You have severe pain that does not get better with medicine.  You have sudden numbness or weakness in the foot or ankle below the affected area, or you have trouble moving your foot or ankle.  You  have a fever and you have worse or persistent symptoms.  You have chest pain.  You have shortness of breath. Summary  Chronic venous insufficiency, also called venous stasis, is a condition that prevents blood from being pumped effectively through the veins in your legs.  Chronic venous insufficiency occurs when the vein walls become stretched, weakened, or damaged, or when valves within the vein are damaged.  Treatment for this condition depends on how severe your condition is, and it may involve wearing compression stockings or having a procedure.  Make sure you stay active by exercising, walking, or doing different activities. Ask your health care provider what activities are safe for you and how much exercise you need. This information is not intended to replace advice given to you by your health care provider. Make sure you discuss any questions you have with your health care provider. Document Released: 02/07/2007 Document Revised: 08/23/2016 Document Reviewed: 08/23/2016 Elsevier Interactive Patient Education  2019 [employer] Inc. Heart Failure  Heart failure means your heart has trouble pumping blood. This makes it hard for your body to work well. Heart failure is usually a long-term (chronic)  condition. You must take good care of yourself and follow your treatment plan from your doctor. Follow these instructions at home: Medicines  Take over-the-counter and prescription medicines only as told by your doctor. ? Do not stop taking your medicine unless your doctor told you to do that. ? Do not skip any doses. ? Refill your prescriptions before you run out of medicine. You need your medicines every day. Eating and drinking   Eat heart-healthy foods. Talk with a diet and nutrition specialist (dietitian) to make an eating plan.  Choose foods that: ? Have no trans fat. ? Are low in saturated fat and cholesterol.  Choose healthy foods, like: ? Fresh or frozen fruits and vegetables. ? Fish. ? Low-fat (lean) meats. ? Legumes (like beans, peas, and lentils). ? Fat-free or low-fat dairy products. ? Whole-grain foods. ? High-fiber foods.  Limit salt (sodium) if told by your doctor. Ask your nutrition specialist to recommend heart-healthy seasonings.  Cook in healthy ways instead of frying. Healthy ways of cooking include: ? Roasting. ? Grilling. ? Broiling. ? Baking. ? Poaching. ? Steaming. ? Stir-frying.  Limit how much fluid you drink, if told by your doctor. Lifestyle  Do not smoke or use chewing tobacco. Do not use nicotine gum or patches before talking to your doctor.  Limit alcohol intake to no more than 1 drink a day for non-pregnant women and 2 drinks a day for men. One drink equals 12 oz of beer, 5 oz of wine, or 1 oz of hard liquor. ? Tell your doctor if you drink alcohol many times a week. ? Talk with your doctor about whether any alcohol is safe for you. ? You should stop drinking alcohol: ? If your heart has been damaged by alcohol. ? You have very bad heart failure.  Do not use illegal drugs.  Lose weight if told by your doctor.  Do moderate physical activity if told by your doctor. Ask your doctor what activities are safe for you if: ? You are of  older age (elderly). ? You have very bad heart failure. Keep track of important information  Weigh yourself every day. ? Weigh yourself every morning after you pee (urinate) and before breakfast. ? Wear the same amount of clothing each time. ? Write down your daily weight. Give your record to your doctor.  Check  and write down your blood pressure as told by your doctor.  Check your pulse as told by your doctor. Dealing with heat and cold  If the weather is very hot: ? Avoid activity that takes a lot of energy. ? Use air conditioning or fans, or find a cooler place. ? Avoid caffeine. ? Avoid alcohol. ? Wear clothing that is loose-fitting, lightweight, and light-colored.  If the weather is very cold: ? Avoid activity that takes a lot of energy. ? Layer your clothes. ? Wear mittens or gloves, a hat, and a scarf when you go outside. ? Avoid alcohol. General instructions  Manage other conditions that you have as told by your doctor.  Learn to manage stress. If you need help, ask your doctor.  Plan rest periods for when you get tired.  Get education and support as needed.  Get rehab (rehabilitation) to help you stay independent and to help with everyday tasks.  Stay up to date with shots (immunizations), especially pneumococcal and flu (influenza) shots.  Keep all follow-up visits as told by your doctor. This is important. Contact a doctor if:  You gain weight quickly.  You are more short of breath than normal.  You cannot do your normal activities.  You tire easily.  You cough more than normal, especially with activity.  You have any or more puffiness (swelling) in areas such as your hands, feet, ankles, or belly (abdomen).  You cannot sleep because it is hard to breathe.  You feel like your heart is beating fast (palpitations).  You get dizzy or light-headed when you stand up. Get help right away if:  You have trouble breathing.  You or someone else notices  a change in your awareness. This could be trouble staying awake or trouble concentrating.  You have chest pain or discomfort.  You pass out (faint). Summary  Heart failure means your heart has trouble pumping blood.  Make sure you refill your prescriptions before you run out of medicine. You need your medicines every day.  Keep records of your weight and blood pressure to give to your doctor.  Contact a doctor if you gain weight quickly. This information is not intended to replace advice given to you by your health care provider. Make sure you discuss any questions you have with your health care provider. Document Released: 07/13/2008 Document Revised: 06/28/2018 Document Reviewed: 10/26/2016 Elsevier Interactive Patient Education  2019 [employer].

## 2019-02-02 NOTE — Discharge Summary (Signed)
Physician Discharge Summary  [name] IRJ:[id number] DOB: 04-05-1961 DOA: 01/29/2019  PCP: [name] Sanes, MD  Admit date: 01/29/2019 Discharge date: 02/02/2019  Admitted From: Home Disposition: Home  Recommendations for Outpatient Follow-up:  1. Follow up with PCP in 1-2 weeks 2. Please obtain BMP with magnesium in 1-2 weeks  Home Health: No Equipment/Devices: None  Discharge Condition: Stable CODE STATUS: Full code Diet recommendation: Heart healthy, 2 g sodium restricted  History of present illness:  58 year old male who has hypertension, type 2 diabetes mellitus and obesity who presents with 3 weeks of worsening lower extremity erythematous edema, it has been refractive to outpatient therapyto anincreased dose of furosemide and oralantibiotics.Symptoms tend to be worse when standing,patient denied any fevers, angina or dyspnea.   On his initial physical examination his blood pressure is 144/67, temperature 97.9, heart rate 84, respirate 18, oxygen saturation 98% on room,he has mild pallor, his lungs are clear to auscultation bilaterally, heart S1-S2 present rhythmic, abdomen protuberant nontender, +4+ lower extremity edema, positive blanching erythema bilaterally, no purulence. No increased local temperature. Sodium 135, potassium 2.4, chloride 96, bicarb 21, glucose 137, BUN less than 5, creatinine 0.76, white count 8.2, hemoglobin 10.8, hematocrit 31.6, platelets 207.Ultrasonography of the lower extremities negative for deep vein thrombosis.  Patient will be admitted to the hospital with working diagnosis of worsening bilateral extremity edema,rule out persistent cellulitis, not vascular insufficiency, heart failure  Hospital course:  Acute on chronic diastolic congestive heart failure Patient presenting with progressive lower extremity edema with bilateral erythematous changes thought to be initially secondary to cellulitis.  Patient has been progressing despite  increased dose of furosemide and oral antibiotics outpatient.  Echocardiogram 01/30/2019 notable for EF 60-65% with diastolic dysfunction.  Bilateral lower extremity duplex ultrasound negative for DVT. Patient is afebrile without leukocytosis.  Continued antibiotics as this is not related to cellulitis but excess fluid with venous insufficiency.  Patient was started on aggressive diuresis with furosemide 60 mg IV every 12 hours and started on a 2 g sodium restricted diet.  Patient diuresed well with a net negative of 5.4 L during the hospitalization.  Patient was continued on his metoprolol 100 mg p.o. twice daily.  Patient will discharge home on increased home dose of furosemide 40 mg p.o. twice daily with 40 mEq of potassium supplementation daily.  Patient instructed to maintain daily weights with a log to bring to next PCP visit.  Recommend repeat BMP with magnesium level at visit.  Hypokalemiaand hypomagnesemia Patient's potassium and magnesium was monitored during hospitalization and repleted as needed.  Etiology was likely from aggressive IV diuresis.  Patient will discharge home with increased potassium supplementation of 40 mEq daily.  Recommend PCP repeat BMP with magnesium in next visit.  Essential hypertension Patient's home amlodipine was discontinued as can exacerbate lower extremity edema.  He was continued on metoprolol 100 mg p.o. twice daily.    Depression Continue duloxetine  Obesity BMI is 36,9; discussed with patient needs aggressive weight loss measures as this complicates all facets of care.  Bilateral lower extremity wounds/ no deep cellulitis.  Clinically predominately superficial, discontinued IV antibiotic therapy. Noted elevated lactic acid, likely B lactic acid. No clinical signs of sepsis.  Wound care was consulted with no further recommendations.  Patient was seen by physical therapy with no other recommendations.  His antibiotics were discontinued as his bilateral  lower extremities erythema secondary to volume overload/venous stasis.  T2DM Continue metformin  Discharge Diagnoses:  Active Problems:   Hypertension  Morbid obesity (HCC)   Type 2 diabetes mellitus with hyperlipidemia (HCC)   Acute on chronic diastolic CHF (congestive heart failure) [facility])    Discharge Instructions  Discharge Instructions    (HEART FAILURE PATIENTS) Call MD:  Anytime you have any of the following symptoms: 1) 3 pound weight gain in 24 hours or 5 pounds in 1 week 2) shortness of breath, with or without a dry hacking cough 3) swelling in the hands, feet or stomach 4) if you have to sleep on extra pillows at night in order to breathe.   Complete by:  As directed    Call MD for:  persistant dizziness or light-headedness   Complete by:  As directed    Call MD for:  persistant nausea and vomiting   Complete by:  As directed    Call MD for:  redness, tenderness, or signs of infection (pain, swelling, redness, odor or green/yellow discharge around incision site)   Complete by:  As directed    Call MD for:  temperature >100.4   Complete by:  As directed    Diet - low sodium heart healthy   Complete by:  As directed    Diet - low sodium heart healthy   Complete by:  As directed    Heart Failure patients record your daily weight using the same scale at the same time of day   Complete by:  As directed    Increase activity slowly   Complete by:  As directed    Increase activity slowly   Complete by:  As directed    STOP any activity that causes chest pain, shortness of breath, dizziness, sweating, or exessive weakness   Complete by:  As directed      Allergies as of 02/02/2019      Reactions   Claritin [loratadine] Shortness Of Breath, Anxiety      Medication List    STOP taking these medications   amLODipine 5 MG tablet Commonly known as:  NORVASC   doxycycline 100 MG tablet Commonly known as:  VIBRA-TABS   folic acid 1 MG tablet Commonly known as:   FOLVITE   thiamine 100 MG tablet     TAKE these medications   aspirin 81 MG chewable tablet Chew 1 tablet (81 mg total) by mouth 2 (two) times daily.   Benadryl Allergy 25 MG tablet Generic drug:  diphenhydrAMINE Take 25 mg by mouth daily.   calcium carbonate 500 MG chewable tablet Commonly known as:  TUMS - dosed in mg elemental calcium Chew 2 tablets by mouth daily as needed for indigestion or heartburn.   docusate sodium 100 MG capsule Commonly known as:  COLACE Take 1 capsule (100 mg total) by mouth 2 (two) times daily. What changed:    when to take this  reasons to take this   Docusate Sodium 100 MG capsule Take 100 mg by mouth daily. What changed:  Another medication with the same name was changed. Make sure you understand how and when to take each.   DULoxetine 20 MG capsule Commonly known as:  CYMBALTA Take 1 capsule (20 mg total) by mouth daily. What changed:  when to take this   furosemide 40 MG tablet Commonly known as:  Lasix Take 1 tablet (40 mg total) by mouth 2 (two) times daily for 30 days. What changed:    medication strength  when to take this   metFORMIN 500 MG tablet Commonly known as:  GLUCOPHAGE Take 1 tablet (500 mg  total) by mouth 2 (two) times daily with a meal.   metoprolol tartrate 100 MG tablet Commonly known as:  LOPRESSOR TAKE 1 TABLET (100 MG TOTAL) BY MOUTH 2 (TWO) TIMES DAILY. What changed:    when to take this  additional instructions   multivitamin with minerals Tabs tablet Take 1 tablet by mouth daily.   ondansetron 4 MG tablet Commonly known as:  ZOFRAN Take 1 tablet (4 mg total) by mouth every 6 (six) hours as needed for nausea.   potassium chloride SA 20 MEQ tablet Commonly known as:  Klor-Con M20 Take 2 tablets (40 mEq total) by mouth 2 (two) times daily. What changed:    how much to take  when to take this   senna 8.6 MG Tabs tablet Commonly known as:  SENOKOT Take 1 tablet (8.6 mg total) by mouth 2  (two) times daily.      Follow-up Information    [name] Sanes, MD. Schedule an appointment as soon as possible for a visit in 1 week(s).   Specialty:  Internal Medicine Contact information: [address] [place] [address] [phone]          Allergies  Allergen Reactions  . Claritin [Loratadine] Shortness Of Breath and Anxiety    Consultations:  none   Procedures/Studies: Vas [place] Lower Extremity Venous (dvt) (only [facility])  Result Date: 01/29/2019  Lower Venous Study Indications: Edema, Erythema, and Open wounds, weeping.  Limitations: Body habitus, open wound and edema, skin texture. Comparison Study: No prior LEV on file Performing Technologist: Sherren Kerns RVS  Examination Guidelines: A complete evaluation includes B-mode imaging, spectral Doppler, color Doppler, and power Doppler as needed of all accessible portions of each vessel. Bilateral testing is considered an integral part of a complete examination. Limited examinations for reoccurring indications may be performed as noted.  Right Venous Findings: +---------+---------------+---------+-----------+----------+--------------+          CompressibilityPhasicitySpontaneityPropertiesSummary        +---------+---------------+---------+-----------+----------+--------------+ CFV      Full                                                        +---------+---------------+---------+-----------+----------+--------------+ SFJ      Full                                                        +---------+---------------+---------+-----------+----------+--------------+ FV Prox  Full                                                        +---------+---------------+---------+-----------+----------+--------------+ FV Mid   Full                                                        +---------+---------------+---------+-----------+----------+--------------+ FV DistalFull                                                         +---------+---------------+---------+-----------+----------+--------------+  PFV      Full                                                        +---------+---------------+---------+-----------+----------+--------------+ POP      Full                                                        +---------+---------------+---------+-----------+----------+--------------+ PTV                                                   Not visualized +---------+---------------+---------+-----------+----------+--------------+ PERO                                                  Not visualized +---------+---------------+---------+-----------+----------+--------------+ GSV      Full                                                        +---------+---------------+---------+-----------+----------+--------------+  Right Technical Findings: Not visualized segments include Posterior tibial and peroneal veins.  Left Venous Findings: +---------+---------------+---------+-----------+----------+--------------+          CompressibilityPhasicitySpontaneityPropertiesSummary        +---------+---------------+---------+-----------+----------+--------------+ CFV      Full           Yes      Yes                                 +---------+---------------+---------+-----------+----------+--------------+ SFJ      Full                                                        +---------+---------------+---------+-----------+----------+--------------+ FV Prox  Full                                                        +---------+---------------+---------+-----------+----------+--------------+ FV Mid                  Yes      Yes                  visualized     +---------+---------------+---------+-----------+----------+--------------+ FV Distal               Yes      Yes  visualized      +---------+---------------+---------+-----------+----------+--------------+ PFV      Full                                                        +---------+---------------+---------+-----------+----------+--------------+ POP      Full                                                        +---------+---------------+---------+-----------+----------+--------------+ PTV                                                   Not visualized +---------+---------------+---------+-----------+----------+--------------+ PERO                                                  Not visualized +---------+---------------+---------+-----------+----------+--------------+ GSV      Full                                                        +---------+---------------+---------+-----------+----------+--------------+  Left Technical Findings: Not visualized segments include posterior tibial and peroneal veins.   Summary: Right: There is no evidence of deep vein thrombosis in the lower extremity. However, portions of this examination were limited- see technologist comments above. Left: There is no evidence of deep vein thrombosis in the lower extremity. However, portions of this examination were limited- see technologist comments above.  *See table(s) above for measurements and observations. Electronically signed by Sherald Hess MD on 01/29/2019 at 5:18:51 PM.    Final      Bilateral venous duplex ultrasound: Summary: Right: There is no evidence of deep vein thrombosis in the lower extremity. However, portions of this examination were limited- see technologist comments above. Left: There is no evidence of deep vein thrombosis in the lower extremity. However, portions of this examination were limited- see technologist comments above.  Transthoracic echocardiogram 01/30/2019: IMPRESSIONS 1. The left ventricle has normal systolic function with an ejection fraction of 60-65%. The cavity size was  normal. Left ventricular diastolic Doppler parameters are consistent with impaired relaxation. 2. The right ventricle has normal systolic function. The cavity was normal. 3. The mitral valve is grossly normal. 4. The tricuspid valve is grossly normal. 5. The aortic valve was not well visualized. Mild thickening of the aortic valve. 6. Extremely limited; pt declined definity; normal LV systolic function; cannot R/O wall motion abnormality; mild diastolic dysfunction.  Subjective: Patient seen and examined at bedside, resting comfortably.  Continues with good diuresis with IV Lasix.  Net negative 5.4 L since admission, net negative 1.6 L past 24 hours.  Oxygenating well on room air.  Electrolytes stable.  Ready for discharge home.  No other complaints at this time.  No acute concerns overnight per  nursing staff.   Discharge Exam: Vitals:   02/01/19 2108 02/02/19 0620  BP: (!) 131/55 (!) 99/47  Pulse: 76 72  Resp: 18 17  Temp: 98.9 F (37.2 C) 98.1 F (36.7 C)  SpO2: 97% 95%   Vitals:   02/01/19 0619 02/01/19 1333 02/01/19 2108 02/02/19 0620  BP: (!) 123/49 (!) 114/48 (!) 131/55 (!) 99/47  Pulse: 74 67 76 72  Resp: 17 20 18 17   Temp: 98.6 F (37 C) 98.1 F (36.7 C) 98.9 F (37.2 C) 98.1 F (36.7 C)  TempSrc: Oral Oral Oral Oral  SpO2: 92% 95% 97% 95%  Weight:      Height:        General: Pt is alert, awake, not in acute distress Cardiovascular: RRR, S1/S2 +, no rubs, no gallops Respiratory: CTA bilaterally, no wheezing, no rhonchi Abdominal: Soft, protuberant abdomen, NT, ND, bowel sounds + Extremities: 2+ pitting edema bilateral lower extremities up to knee, with surrounding circumferential erythema and mild weeping    The results of significant diagnostics from this hospitalization (including imaging, microbiology, ancillary and laboratory) are listed below for reference.     Microbiology: Recent Results (from the past 240 hour(s))  Culture, blood (routine x 2)      Status: None (Preliminary result)   Collection Time: 01/29/19  4:14 PM  Result Value Ref Range Status   Specimen Description   Final    BLOOD RIGHT HAND Performed at [facility], [address], [place], [place] [address]    Special Requests   Final    BOTTLES DRAWN AEROBIC AND ANAEROBIC Blood Culture adequate volume Performed at [facility], [address], [place], [place] [address]    Culture   Final    NO GROWTH 4 DAYS Performed at [facility], [address], [place], [place] [address]    Report Status PENDING  Incomplete  Culture, blood (routine x 2)     Status: None (Preliminary result)   Collection Time: 01/29/19  4:14 PM  Result Value Ref Range Status   Specimen Description   Final    RIGHT ANTECUBITAL Performed at [facility], [address], [place], [place] [address]    Special Requests   Final    BOTTLES DRAWN AEROBIC AND ANAEROBIC Blood Culture adequate volume Performed at [facility], [address] [place], [place] [address]    Culture   Final    NO GROWTH 4 DAYS Performed at [facility], [address], [place], [place] [address]    Report Status PENDING  Incomplete     Labs: BNP (last 3 results) No results for input(s): BNP in the last 8760 hours. Basic Metabolic Panel: Recent Labs  Lab 01/29/19 1614 01/30/19 0419 01/31/19 0623 02/01/19 0526 02/02/19 0551  NA 135 137 138 138 137  K 2.4* 2.4* 3.0* 2.8* 3.6  CL 96* 101 102 100 100  CO2 21* 23 24 27 26   GLUCOSE 137* 124* 101* 90 86  BUN <5* <5* <5* 5* 6  CREATININE 0.76 0.68 0.61 0.70 0.75  CALCIUM 7.8* 7.2* 7.1* 7.1* 7.0*  MG  --  1.1* 1.3* 1.4* 1.8   Liver Function Tests: No results for input(s): AST, ALT, ALKPHOS, BILITOT, PROT, ALBUMIN in the last 168 hours. No results for input(s): LIPASE, AMYLASE in the last 168 hours. No results for input(s): AMMONIA in the last 168 hours. CBC: Recent Labs   Lab 01/29/19 1614 01/30/19  [id number] 01/31/19 0623 02/02/19 0551  WBC 8.2 6.8 6.5 6.5  NEUTROABS 5.8  --  4.2  --   HGB 10.8* 10.2* 10.3* 9.7*  HCT 31.6* 30.9* 31.7* 29.3*  MCV 115.8* 115.3* 120.1* 118.6*  PLT 207 179 126* 154   Cardiac Enzymes: No results for input(s): CKTOTAL, CKMB, CKMBINDEX, TROPONINI in the last 168 hours. BNP: Invalid input(s): POCBNP CBG: Recent Labs  Lab 02/01/19 0952 02/01/19 1138 02/01/19 1708 02/01/19 2131 02/02/19 0820  GLUCAP 123* 97 106* 100* 72   D-Dimer No results for input(s): DDIMER in the last 72 hours. Hgb A1c No results for input(s): HGBA1C in the last 72 hours. Lipid Profile No results for input(s): CHOL, HDL, LDLCALC, TRIG, CHOLHDL, LDLDIRECT in the last 72 hours. Thyroid function studies No results for input(s): TSH, T4TOTAL, T3FREE, THYROIDAB in the last 72 hours.  Invalid input(s): FREET3 Anemia work up No results for input(s): VITAMINB12, FOLATE, FERRITIN, TIBC, IRON, RETICCTPCT in the last 72 hours. Urinalysis No results found for: COLORURINE, APPEARANCEUR, LABSPEC, PHURINE, GLUCOSEU, HGBUR, BILIRUBINUR, KETONESUR, PROTEINUR, UROBILINOGEN, NITRITE, LEUKOCYTESUR Sepsis Labs Invalid input(s): PROCALCITONIN,  WBC,  LACTICIDVEN Microbiology Recent Results (from the past 240 hour(s))  Culture, blood (routine x 2)     Status: None (Preliminary result)   Collection Time: 01/29/19  4:14 PM  Result Value Ref Range Status   Specimen Description   Final    BLOOD RIGHT HAND Performed at [facility], [address] [place], [place] [address]    Special Requests   Final    BOTTLES DRAWN AEROBIC AND ANAEROBIC Blood Culture adequate volume Performed at [facility], [address] [place], [place] [address]    Culture   Final    NO GROWTH 4 DAYS Performed at [facility], [address] [place], [place] [address]    Report Status PENDING  Incomplete  Culture, blood (routine x 2)      Status: None (Preliminary result)   Collection Time: 01/29/19  4:14 PM  Result Value Ref Range Status   Specimen Description   Final    RIGHT ANTECUBITAL Performed at [facility], [address] [place], [place] [address]    Special Requests   Final    BOTTLES DRAWN AEROBIC AND ANAEROBIC Blood Culture adequate volume Performed at [facility], [address] [place], [place] [address]    Culture   Final    NO GROWTH 4 DAYS Performed at [facility], [address] [place], [place] [address]    Report Status PENDING  Incomplete     Time coordinating discharge: Over 30 minutes  SIGNED:   Alvira PhilipsEric J [place], DO  [facility] 02/02/2019, 9:30 AM

## 2019-02-03 LAB — CULTURE, BLOOD (ROUTINE X 2)
Culture: NO GROWTH
Culture: NO GROWTH
Special Requests: ADEQUATE
Special Requests: ADEQUATE

## 2019-02-05 ENCOUNTER — Other Ambulatory Visit: Payer: Self-pay | Admitting: *Deleted

## 2019-02-05 ENCOUNTER — Telehealth: Payer: Self-pay

## 2019-02-05 ENCOUNTER — Other Ambulatory Visit: Payer: Self-pay

## 2019-02-05 DIAGNOSIS — I5033 Acute on chronic diastolic (congestive) heart failure: Secondary | ICD-10-CM

## 2019-02-05 NOTE — Telephone Encounter (Signed)
LVM for pt to call back to set up an appointment with Dr. Lawerance Bach today for a hospital follow up.

## 2019-02-05 NOTE — Patient Outreach (Signed)
[facility]) Care Management  02/05/2019  [name] June 24, 1961 [id number]   Referral received from hospital liaison as member was recently discharged after being admitted with sepsis, cellulitis, and heart failure complications.  Primary MD office will complete transition of care assessment.  Per chart, he also has history of hypertension, diabetes, alcohol dependency, and hyperlipidemia.  Call placed to member for telephone assessment, no answer.  HIPAA compliant voice message left, unsuccessful outreach letter sent.  Will follow up within the next 3-4 business days.  Kemper Durie, [place], MSN [facility] Care Management  [name] Manager [phone]

## 2019-02-05 NOTE — Progress Notes (Signed)
Subjective:    Patient ID: [name], male    DOB: 08-Jun-1961, 58 y.o.   MRN: [id number]  HPI The patient is here for follow up from the hospital.  Admitted  01/29/19 - 02/02/19  Recommendations for Outpatient Follow-up:  1. Follow up with PCP in 1-2 weeks 2. Please obtain BMP with magnesium in 1-2 weeks   He went to the ED for three weeks of worsening b/l leg edema non responsive to antibiotics and increased lasix as an outpatient.  The swelling was improved with laying or elevating his legs.  He had b/l erythema and tenderness.  He had oozing blisters.  He had no fever, dyspnea or chest pain.    ED:  He had 4+ pitting edema with erythemaHe was diagnosed with b/l cellulitis and was started on IV antibiotics.  US of the LE showed no DVT.     Acute on chronic diastolic CHF: Progressive edema thought to be initially due to cellulitis [place] of b/l LE neg for DVT Echo 01/30/19:  EF 60-65% with diastolic dysfunction No elevated WBC - antibiotics discontinued Related to excess fluid with venous insufficiency Had aggressive diuresis w lasix  60 mg IV Q 12 Sodium restricted diet Negative 5.4 L fluid balance D/c'd on increased lasix dose of 40 mg BID with 40 mEq of K Advised daily weights Needs repeated BMP, Mg  Hypokalemia, hypomagnesemia: Both repleted Low K likely from aggressive IV diuresis  Hypertension: Amlodipine d/c'd due to potentially increasing edema Continued on metoprolol 100 mg BID  Depression: Continued on cymbalta  Obesity: BMI 36.9  = advised weight loss  B/L LE wounds/ no deep cellulitis: Superficial wounds, IV antibiotics d/c'd No signs of sepsis Wound care consulted w/o further recommendations  Patient seen by PT w/o recommendations  Diabetes: Continued on Metformin  Acute on chronic diastolic CHF, fluid overload:  He is taking his medication daily as prescribed.  He has been compliant with a low sodium diet.  The swelling has decreased a little  since being discharged.  90% of the wheeping wounds have dried up.  He still has a lot of leg swelling.  He denies fever or chills and SOB.  He is exercising minimally. He just got a scale and will start weighing himself daily.      Hypertension: He is taking his medication daily. His amlodipine was d/c'd due to the swelling.  He has been compliant with a low sodium diet.  He denies chest pain, palpitations, shortness of breath and regular headaches.    Diabetes: He is taking his medication daily as prescribed. He is compliant with a diabetic diet.  He monitors his sugars and they have been very well controlled.      Medications and allergies reviewed with patient and updated if appropriate.  Patient Active Problem List   Diagnosis Date Noted  . Acute on chronic diastolic CHF (congestive heart failure) (HCC) 01/30/2019  . Type 2 diabetes mellitus with hyperlipidemia (HCC) 01/29/2019  . Avascular necrosis of hip, left (HCC) 11/02/2018  . Decreased hearing of both ears 10/30/2018  . Decreased GFR 10/30/2018  . Avascular necrosis of hip, right (HCC) 08/16/2018  . Bilateral leg edema 02/27/2018  . Diabetic foot infection (HCC) 02/16/2018  . Alcohol dependence syndrome (HCC) 02/16/2018  . Marijuana abuse 02/16/2018  . Diabetic ulcer of right midfoot associated with type 2 diabetes mellitus, with necrosis of muscle (HCC) 11/16/2017  . Foot ulcer (HCC) 12/21/2016  . Bilateral carpal tunnel syndrome 12/15/2016  .  Fatty liver 12/14/2016  . Hyperlipidemia 11/25/2016  . Elevated liver enzymes 11/25/2016  . Diabetes (HCC) 11/25/2016  . Hypertension 11/23/2016  . Neuropathy 11/23/2016  . Toe amputation status, right 11/23/2016  . Morbid obesity (HCC) 11/23/2016  . OSA (obstructive sleep apnea) 11/23/2016  . Other hammer toe (acquired) 11/11/2013  . Exstrophy of bladder 11/11/2013    Current Outpatient Medications on File Prior to Visit  Medication Sig Dispense Refill  . aspirin 81 MG  chewable tablet Chew 1 tablet (81 mg total) by mouth 2 (two) times daily. 60 tablet 1  . calcium carbonate (TUMS - DOSED IN MG ELEMENTAL CALCIUM) 500 MG chewable tablet Chew 2 tablets by mouth daily as needed for indigestion or heartburn.    . diphenhydrAMINE (BENADRYL ALLERGY) 25 MG tablet Take 25 mg by mouth daily.    Marland Kitchen docusate sodium (COLACE) 100 MG capsule Take 1 capsule (100 mg total) by mouth 2 (two) times daily. (Patient taking differently: Take 100 mg by mouth daily as needed for mild constipation. ) 60 capsule 1  . Docusate Sodium 100 MG capsule Take 100 mg by mouth daily.    . DULoxetine (CYMBALTA) 20 MG capsule Take 1 capsule (20 mg total) by mouth daily. (Patient taking differently: Take 20 mg by mouth every other day. ) 30 capsule 3  . furosemide (LASIX) 40 MG tablet Take 1 tablet (40 mg total) by mouth 2 (two) times daily for 30 days. 60 tablet 0  . metFORMIN (GLUCOPHAGE) 500 MG tablet Take 1 tablet (500 mg total) by mouth 2 (two) times daily with a meal. 180 tablet 3  . metoprolol tartrate (LOPRESSOR) 100 MG tablet TAKE 1 TABLET (100 MG TOTAL) BY MOUTH 2 (TWO) TIMES DAILY. (Patient taking differently: Take 100 mg by mouth See admin instructions. "twice daily every other day") 180 tablet 3  . Multiple Vitamin (MULTIVITAMIN WITH MINERALS) TABS tablet Take 1 tablet by mouth daily. 30 tablet 0  . ondansetron (ZOFRAN) 4 MG tablet Take 1 tablet (4 mg total) by mouth every 6 (six) hours as needed for nausea. 20 tablet 0  . potassium chloride SA (KLOR-CON M20) 20 MEQ tablet Take 2 tablets (40 mEq total) by mouth 2 (two) times daily. 60 tablet 0  . senna (SENOKOT) 8.6 MG TABS tablet Take 1 tablet (8.6 mg total) by mouth 2 (two) times daily. 120 each 0   No current facility-administered medications on file prior to visit.     Past Medical History:  Diagnosis Date  . Alcohol dependence (HCC)   . Arthritis    hips, hands  . Bilateral carpal tunnel syndrome   . Bilateral leg edema     Chronic  . Diabetes mellitus without complication (HCC)    type 2  . Diverticulitis    portion of colon removed  . DOE (dyspnea on exertion)    occ  . Elevated liver enzymes   . Fatty liver   . GERD (gastroesophageal reflux disease)    occ  . Hammer toe   . Hip pain   . History of ventral hernia repair 2016   x2  . Hyperlipidemia    pt unsure  . Hypertension   . Marijuana abuse   . Morbid obesity (HCC)   . Neuromuscular disorder (HCC)    peripheral neuropathy feet and few fingers  . OSA (obstructive sleep apnea)    has OSA-not used CPAP 2-3 yrs could not tolerate cpap  . PONV (postoperative nausea and vomiting)   .  Toe ulcer (HCC)    left healed    Past Surgical History:  Procedure Laterality Date  . AMPUTATION TOE Left 05/25/2018   Procedure: AMPUTATION TOE left 3rd;  Surgeon: Toni ArthursHewitt, John, MD;  Location: [facility];  Service: Orthopedics;  Laterality: Left;  60min, to follow  . AMPUTATION TOE Left 06/28/2018   Procedure: Left foot revision 3rd toe amputation including 3rd metatarsal;  Surgeon: Toni ArthursHewitt, John, MD;  Location: [facility];  Service: Orthopedics;  Laterality: Left;  60min  . BICEPS TENDON REPAIR Left 2014   Partial  . COLONOSCOPY    . HERNIA REPAIR  2016   ventral  . HIP CLOSED REDUCTION Right 09/01/2018   Procedure: CLOSED REDUCTION HIP;  Surgeon: Yolonda Kidaogers, Jason Patrick, MD;  Location: [facility];  Service: Orthopedics;  Laterality: Right;  . JOINT REPLACEMENT     b/l knees   . TOE AMPUTATION Right 09/2013  . TOTAL HIP ARTHROPLASTY Right 08/16/2018   Procedure: TOTAL HIP ARTHROPLASTY ANTERIOR APPROACH;  Surgeon: Samson FredericSwinteck, Brian, MD;  Location: [facility];  Service: Orthopedics;  Laterality: Right;  . TOTAL HIP ARTHROPLASTY Left 11/02/2018   Procedure: TOTAL HIP ARTHROPLASTY ANTERIOR APPROACH;  Surgeon: Samson FredericSwinteck, Brian, MD;  Location: [facility];  Service: Orthopedics;  Laterality: Left;  . TOTAL KNEE ARTHROPLASTY     bilat    Social  History   Socioeconomic History  . Marital status: Divorced    Spouse name: Not on file  . Number of children: Not on file  . Years of education: Not on file  . Highest education level: Not on file  Occupational History  . Occupation: unemployed, Press photographerfiling for disability  Social Needs  . Financial resource strain: Not on file  . Food insecurity:    Worry: Not on file    Inability: Not on file  . Transportation needs:    Medical: Not on file    Non-medical: Not on file  Tobacco Use  . Smoking status: Former Smoker    Packs/day: 0.25    Years: 10.00    Pack years: 2.50    Types: Cigarettes  . Smokeless tobacco: Never Used  . Tobacco comment: quit 2018  Substance and Sexual Activity  . Alcohol use: Yes    Comment: 2-3 drinks 4-5 nights a week, liquor  . Drug use: Yes    Frequency: 1.0 times per week    Types: Marijuana    Comment: "4 times a month, maybe"  . Sexual activity: Not on file  Lifestyle  . Physical activity:    Days per week: Not on file    Minutes per session: Not on file  . Stress: Not on file  Relationships  . Social connections:    Talks on phone: Not on file    Gets together: Not on file    Attends religious service: Not on file    Active member of club or organization: Not on file    Attends meetings of clubs or organizations: Not on file    Relationship status: Not on file  Other Topics Concern  . Not on file  Social History Narrative  . Not on file    Family History  Adopted: Yes  Family history unknown: Yes    Review of Systems  Constitutional: Negative for chills and fever.  Respiratory: Positive for cough (occ - allergy related). Negative for shortness of breath and wheezing.   Cardiovascular: Positive for leg swelling. Negative for chest pain and palpitations.  Gastrointestinal: Negative  for blood in stool (no black stool).  Endocrine: Positive for cold intolerance.  Genitourinary: Negative for hematuria.  Neurological: Positive for  headaches (occ). Negative for light-headedness.       Objective:   Vitals:   02/06/19 1413  BP: 140/78  Pulse: 71  Resp: 18  Temp: 97.7 F (36.5 C)  SpO2: 95%   BP Readings from Last 3 Encounters:  02/06/19 140/78  02/02/19 (!) 99/47  11/03/18 120/73   Wt Readings from Last 3 Encounters:  02/06/19 (!) 335 lb (152 kg)  01/29/19 280 lb (127 kg)  11/02/18 262 lb (118.8 kg)   Body mass index is 44.2 kg/m.   Physical Exam    Constitutional: Appears well-developed and well-nourished. No distress.  HENT:  Head: Normocephalic and atraumatic.  Neck: Neck supple. No tracheal deviation present. No thyromegaly present.  No cervical lymphadenopathy Cardiovascular: Normal rate, regular rhythm and normal heart sounds.   No murmur heard. No carotid bruit .  2 + b/l LE pitting edema, no open ulcer/wounds  - several closed bubbles in the skin that he states were weeping when he went into the hospital Pulmonary/Chest: Effort normal and breath sounds normal. No respiratory distress. No has no wheezes. No rales.  Skin: Skin is warm and dry. Not diaphoretic.  Very mild erythema b/l LE from swelling/ venous stasis  Psychiatric: Normal mood and affect. Behavior is normal.    ECHOCARDIOGRAM COMPLETE   ECHOCARDIOGRAM REPORT       Patient Name:   PEYSON GALIZA Date of Exam: 01/30/2019 Medical Rec #:  [id number]    Height:       73.0 in Accession #:    [id number]   Weight:       280.0 lb Date of Birth:  10/02/1961    BSA:          2.48 m Patient Age:    57 years     BP:           110/71 mmHg Patient Gender: M            HR:           90 bpm. Exam Location:  Inpatient    Procedure: 2D Echo  Indications:    CHF-Acute Diastolic 428.31 / I50.31   History:        Patient has no prior history of Echocardiogram examinations.                 Risk Factors: Hypertension, Diabetes and Dyslipidemia. Bilateral                 lower extremity edema. Cellulitis.   Sonographer:    Leta Jungling RDCS  Referring Phys: [id number] [facility] ARRIEN    Sonographer Comments: Technically difficult study due to poor echo windows and patient is morbidly obese. Patient refused the use of definity, beacause he wasn't feeling well. IMPRESSIONS   1. The left ventricle has normal systolic function with an ejection fraction of 60-65%. The cavity size was normal. Left ventricular diastolic Doppler parameters are consistent with impaired relaxation.  2. The right ventricle has normal systolic function. The cavity was normal.  3. The mitral valve is grossly normal.  4. The tricuspid valve is grossly normal.  5. The aortic valve was not well visualized. Mild thickening of the aortic valve.  6. Extremely limited; pt declined definity; normal LV systolic function; cannot R/O wall motion abnormality; mild diastolic dysfunction.  FINDINGS  Left Ventricle: The left ventricle has  normal systolic function, with an ejection fraction of 60-65%. The cavity size was normal. There is no increase in left ventricular wall thickness. Left ventricular diastolic Doppler parameters are consistent with  impaired relaxation. Right Ventricle: The right ventricle has normal systolic function. The cavity was normal. Left Atrium: Left atrial size was normal in size. Right Atrium: Right atrial size was normal in size.   Pericardium: There is no evidence of pericardial effusion. Mitral Valve: The mitral valve is grossly normal. Mitral valve regurgitation is not visualized by color flow Doppler. Tricuspid Valve: The tricuspid valve is grossly normal. Tricuspid valve regurgitation was not visualized by color flow Doppler. Aortic Valve: The aortic valve was not well visualized Mild thickening of the aortic valve. Aortic valve regurgitation was not visualized by color flow Doppler. Pulmonic Valve: The pulmonic valve was not well visualized. Pulmonic valve regurgitation was not assessed by color flow Doppler. Venous: The inferior  vena cava was not well visualized.   LEFT VENTRICLE PLAX 2D LVOT diam:     2.50 cm   Diastology LVOT Area:     4.91 cm  LV e' lateral:   10.10 cm/s                          LV E/e' lateral: 8.2                          LV e' medial:    8.38 cm/s                          LV E/e' medial:  9.8    RIGHT VENTRICLE RV S prime:     20.90 cm/s  LEFT ATRIUM             Index LA Vol (A2C):   55.3 ml 22.28 ml/m LA Vol (A4C):   88.5 ml 35.65 ml/m LA Biplane Vol: 72.0 ml 29.00 ml/m  AORTIC VALVE LVOT Vmax:   96.20 cm/s LVOT Vmean:  60.800 cm/s LVOT VTI:    0.160 m  MITRAL VALVE MV Area (PHT): 3.12 cm MV PHT:        70.47 msec MV Decel Time: 243 msec MV E velocity: 82.50 cm/s MV A velocity: 86.80 cm/s MV E/A ratio:  0.95    Olga Millers MD Electronically signed by Olga Millers MD Signature Date/Time: 01/30/2019/11:09:49 AM      Final     Assessment & Plan:    See Problem List for Assessment and Plan of chronic medical problems.

## 2019-02-06 ENCOUNTER — Encounter: Payer: Self-pay | Admitting: Internal Medicine

## 2019-02-06 ENCOUNTER — Other Ambulatory Visit (INDEPENDENT_AMBULATORY_CARE_PROVIDER_SITE_OTHER): Payer: BLUE CROSS/BLUE SHIELD

## 2019-02-06 ENCOUNTER — Ambulatory Visit: Payer: BLUE CROSS/BLUE SHIELD | Admitting: Internal Medicine

## 2019-02-06 ENCOUNTER — Other Ambulatory Visit: Payer: Self-pay

## 2019-02-06 VITALS — BP 140/78 | HR 71 | Temp 97.7°F | Resp 18 | Ht 73.0 in | Wt 335.0 lb

## 2019-02-06 DIAGNOSIS — I10 Essential (primary) hypertension: Secondary | ICD-10-CM

## 2019-02-06 DIAGNOSIS — D649 Anemia, unspecified: Secondary | ICD-10-CM

## 2019-02-06 DIAGNOSIS — I5033 Acute on chronic diastolic (congestive) heart failure: Secondary | ICD-10-CM | POA: Diagnosis not present

## 2019-02-06 DIAGNOSIS — E1142 Type 2 diabetes mellitus with diabetic polyneuropathy: Secondary | ICD-10-CM | POA: Diagnosis not present

## 2019-02-06 DIAGNOSIS — R6889 Other general symptoms and signs: Secondary | ICD-10-CM

## 2019-02-06 DIAGNOSIS — F1029 Alcohol dependence with unspecified alcohol-induced disorder: Secondary | ICD-10-CM

## 2019-02-06 DIAGNOSIS — E038 Other specified hypothyroidism: Secondary | ICD-10-CM

## 2019-02-06 DIAGNOSIS — K760 Fatty (change of) liver, not elsewhere classified: Secondary | ICD-10-CM

## 2019-02-06 LAB — CBC WITH DIFFERENTIAL/PLATELET
Basophils Absolute: 0 10*3/uL (ref 0.0–0.1)
Basophils Relative: 0.6 % (ref 0.0–3.0)
Eosinophils Absolute: 0.1 10*3/uL (ref 0.0–0.7)
Eosinophils Relative: 0.7 % (ref 0.0–5.0)
HCT: 33 % — ABNORMAL LOW (ref 39.0–52.0)
Hemoglobin: 11.2 g/dL — ABNORMAL LOW (ref 13.0–17.0)
Lymphocytes Relative: 18.5 % (ref 12.0–46.0)
Lymphs Abs: 1.5 10*3/uL (ref 0.7–4.0)
MCHC: 33.9 g/dL (ref 30.0–36.0)
MCV: 117.5 fl — ABNORMAL HIGH (ref 78.0–100.0)
Monocytes Absolute: 0.8 10*3/uL (ref 0.1–1.0)
Monocytes Relative: 10.7 % (ref 3.0–12.0)
Neutro Abs: 5.5 10*3/uL (ref 1.4–7.7)
Neutrophils Relative %: 69.5 % (ref 43.0–77.0)
Platelets: 240 10*3/uL (ref 150.0–400.0)
RBC: 2.81 Mil/uL — ABNORMAL LOW (ref 4.22–5.81)
RDW: 16.5 % — ABNORMAL HIGH (ref 11.5–15.5)
WBC: 7.9 10*3/uL (ref 4.0–10.5)

## 2019-02-06 LAB — COMPREHENSIVE METABOLIC PANEL
ALT: 56 U/L — ABNORMAL HIGH (ref 0–53)
AST: 170 U/L — ABNORMAL HIGH (ref 0–37)
Albumin: 2.9 g/dL — ABNORMAL LOW (ref 3.5–5.2)
Alkaline Phosphatase: 394 U/L — ABNORMAL HIGH (ref 39–117)
BUN: 9 mg/dL (ref 6–23)
CO2: 26 mEq/L (ref 19–32)
Calcium: 9.6 mg/dL (ref 8.4–10.5)
Chloride: 98 mEq/L (ref 96–112)
Creatinine, Ser: 0.95 mg/dL (ref 0.40–1.50)
GFR: 81.56 mL/min (ref >60.00)
Glucose, Bld: 131 mg/dL — ABNORMAL HIGH (ref 70–99)
Potassium: 3.9 mEq/L (ref 3.5–5.1)
Sodium: 136 mEq/L (ref 135–145)
Total Bilirubin: 6 mg/dL — ABNORMAL HIGH (ref 0.2–1.2)
Total Protein: 7.3 g/dL (ref 6.0–8.3)

## 2019-02-06 LAB — TSH: TSH: 9.61 u[IU]/mL — ABNORMAL HIGH (ref 0.35–4.50)

## 2019-02-06 LAB — HEMOGLOBIN A1C: Hgb A1c MFr Bld: 4.7 % (ref 4.6–6.5)

## 2019-02-06 MED ORDER — DULOXETINE HCL 20 MG PO CPEP: 20.0000 mg | ORAL_CAPSULE | Freq: Every day | ORAL | 1 refills | Status: DC

## 2019-02-06 NOTE — Assessment & Plan Note (Signed)
Anemia in the hospital that did get worse Recheck cbc, iron panel

## 2019-02-06 NOTE — Assessment & Plan Note (Signed)
Amlodipine d/c'd in hospital due to edema Continue metoprolol Will monitor BP Continue low sodium diet Increase exercise as tolerated Work on weight loss May need a second medication

## 2019-02-06 NOTE — Assessment & Plan Note (Addendum)
Reviewed Echo Some improvement in legs, but still 2 + edema, no SOB Continue lasix and potassium at current dose Check cmp Start weighing daily Continue low sodium diet Encourage more exercise and weight loss Elevate legs when sitting Stressed no alcohol - he was drinking 4 drinks a night prior to going into the hospital

## 2019-02-06 NOTE — Assessment & Plan Note (Signed)
No longer drinking Was drinking 4 drinks prior to going into the hospital - nothing since then Stressed the importance of not drinking any alcohol

## 2019-02-06 NOTE — Patient Instructions (Signed)
  Tests ordered today. Your results will be released to MyChart (or called to you) after review, usually within 72hours after test completion. If any changes need to be made, you will be notified at that same time.  Medications reviewed and updated.  Changes include :   None   Your prescription(s) have been submitted to your pharmacy. Please take as directed and contact our office if you believe you are having problem(s) with the medication(s).    Please followup in 3 months

## 2019-02-06 NOTE — Assessment & Plan Note (Signed)
a1c has always been low - on metformin -  Check a1c

## 2019-02-06 NOTE — Assessment & Plan Note (Signed)
Stressed the importance of not drinking any alcohol due to having a fatty liver and increased risk of cirrhosis He is not currently drinking Work on weight loss

## 2019-02-06 NOTE — Assessment & Plan Note (Signed)
?   Related to anemia - check cbc, iron panel - no evidence of bleeding Will check tsh

## 2019-02-07 LAB — IRON,TIBC AND FERRITIN PANEL
%SAT: 40 % (calc) (ref 20–48)
Ferritin: 259 ng/mL (ref 38–380)
Iron: 82 ug/dL (ref 50–180)
TIBC: 207 mcg/dL (calc) — ABNORMAL LOW (ref 250–425)

## 2019-02-07 MED ORDER — LEVOTHYROXINE SODIUM 25 MCG PO TABS: 25.0000 ug | ORAL_TABLET | Freq: Every day | ORAL | 1 refills | Status: DC

## 2019-02-08 ENCOUNTER — Other Ambulatory Visit: Payer: Self-pay | Admitting: *Deleted

## 2019-02-08 NOTE — Patient Outreach (Signed)
[facility]) Care Management  02/08/2019  Juanalberto Abadi [birth year] [id number]   Second attempt made to contact member for telephone assessment, no answer.  HIPAA compliant voice message left.  Will make third attempt within the next 3-4 business days.  Kemper Durie, [place], MSN [facility] Care Management  [facility] Manager [phone]

## 2019-02-13 ENCOUNTER — Other Ambulatory Visit: Payer: Self-pay | Admitting: *Deleted

## 2019-02-13 NOTE — Patient Outreach (Signed)
[facility]) Care Management  02/13/2019  Jahn Maros 1961/02/14 [id number]   Third attempt made to contact member for telephone assessment post hospital discharge.  No answer, HIPAA compliant voice message left.  Will await call back, if no response by 5/4, will close case due to inability to establish contact.  Kemper Durie, [place], MSN [facility] Care Management  [facility] Manager [phone]

## 2019-02-21 ENCOUNTER — Other Ambulatory Visit: Payer: Self-pay | Admitting: *Deleted

## 2019-02-21 NOTE — Patient Outreach (Signed)
[facility]) Care Management  02/21/2019  Tonny Boisseau 1961/07/29 [id number]   No response from member after multiple unsuccessful outreach attempts and letter sent.  Will close case at this time due to inability to maintain contact.  Will notify member and primary MD of case closure.   Kemper Durie, [place], MSN [facility] Care Management  [facility] Manager [phone]

## 2019-03-13 ENCOUNTER — Telehealth: Payer: Self-pay | Admitting: Internal Medicine

## 2019-03-13 MED ORDER — POTASSIUM CHLORIDE CRYS ER 20 MEQ PO TBCR: 40.0000 meq | EXTENDED_RELEASE_TABLET | Freq: Two times a day (BID) | ORAL | 0 refills | Status: DC

## 2019-03-13 NOTE — Telephone Encounter (Signed)
Continue dose from hosp - 40 meq twice daily

## 2019-03-13 NOTE — Telephone Encounter (Signed)
Copied from CRM [id number]. Topic: Quick Communication - Rx Refill/Question >> Mar 13, 2019 12:39 PM Maia Petties wrote: Medication: potassium chloride SA (KLOR-CON M20) 20 MEQ tablet - pt was ordered 2 in the morning and 2 in the evening from the hospital so he is out of medication. Is he to continue 2 am and 2 pm or go back to 1 daily? Please call to advise  Has the patient contacted their pharmacy? Yes - cannot fill  Preferred Pharmacy (with phone number or street name): [id number] [place], [place] - [address] [phone] (Phone) [phone] (Fax)  pharmacy.

## 2019-03-13 NOTE — Telephone Encounter (Signed)
LVM letting pt know.  

## 2019-03-13 NOTE — Telephone Encounter (Signed)
Looks like when [employer] last prescribed in April potassium was increased. Do you want him to stay on the same dose?

## 2019-03-22 ENCOUNTER — Encounter: Payer: Self-pay | Admitting: Internal Medicine

## 2019-03-22 DIAGNOSIS — R5381 Other malaise: Secondary | ICD-10-CM

## 2019-04-02 ENCOUNTER — Other Ambulatory Visit: Payer: Self-pay

## 2019-04-02 MED ORDER — FUROSEMIDE 40 MG PO TABS: 40.0000 mg | ORAL_TABLET | Freq: Two times a day (BID) | ORAL | 0 refills | Status: DC

## 2019-04-02 NOTE — Telephone Encounter (Signed)
Does patient need to continue with furosemide 40 mg daily?

## 2019-04-03 ENCOUNTER — Other Ambulatory Visit: Payer: Self-pay | Admitting: Internal Medicine

## 2019-04-05 ENCOUNTER — Encounter: Payer: Self-pay | Admitting: Physical Therapy

## 2019-04-05 ENCOUNTER — Ambulatory Visit: Payer: BLUE CROSS/BLUE SHIELD | Attending: Internal Medicine | Admitting: Physical Therapy

## 2019-04-05 ENCOUNTER — Other Ambulatory Visit: Payer: Self-pay

## 2019-04-05 DIAGNOSIS — R29898 Other symptoms and signs involving the musculoskeletal system: Secondary | ICD-10-CM | POA: Diagnosis not present

## 2019-04-05 DIAGNOSIS — R262 Difficulty in walking, not elsewhere classified: Secondary | ICD-10-CM

## 2019-04-05 DIAGNOSIS — M6281 Muscle weakness (generalized): Secondary | ICD-10-CM

## 2019-04-05 NOTE — Therapy (Signed)
[place], [place], [address] Phone: [phone]   Fax:  [phone]  Physical Therapy Evaluation  Patient Details  Name: [name] MRN: [id number] Date of Birth: 1961-03-23 Referring Provider (PT): Dr Billey Gosling   Encounter Date: 04/05/2019  PT End of Session - 04/05/19 0836    Visit Number  1    Number of Visits  12    Date for PT Re-Evaluation  05/17/19    Authorization Type  BCBS - pt out of network, he is aware    PT Start Time  0835    PT Stop Time  0924    PT Time Calculation (min)  49 min    Activity Tolerance  Patient limited by fatigue       Past Medical History:  Diagnosis Date  . Alcohol dependence ([place])   . Arthritis    hips, hands  . Bilateral carpal tunnel syndrome   . Bilateral leg edema    Chronic  . Diabetes mellitus without complication ([place])    type 2  . Diverticulitis    portion of colon removed  . DOE (dyspnea on exertion)    occ  . Elevated liver enzymes   . Fatty liver   . GERD (gastroesophageal reflux disease)    occ  . Hammer toe   . Hip pain   . History of ventral hernia repair 2016   x2  . Hyperlipidemia    pt unsure  . Hypertension   . Marijuana abuse   . Morbid obesity ([place])   . Neuromuscular disorder ([place])    peripheral neuropathy feet and few fingers  . OSA (obstructive sleep apnea)    has OSA-not used CPAP 2-3 yrs could not tolerate cpap  . PONV (postoperative nausea and vomiting)   . Toe ulcer ([place])    left healed    Past Surgical History:  Procedure Laterality Date  . AMPUTATION TOE Left 05/25/2018   Procedure: AMPUTATION TOE left 3rd;  Surgeon: Wylene Simmer, MD;  Location: [facility];  Service: Orthopedics;  Laterality: Left;  20min, to follow  . AMPUTATION TOE Left 06/28/2018   Procedure: Left foot revision 3rd toe amputation including 3rd metatarsal;  Surgeon: Wylene Simmer, MD;  Location: [facility];  Service:  Orthopedics;  Laterality: Left;  39min  . BICEPS TENDON REPAIR Left 2014   Partial  . COLONOSCOPY    . HERNIA REPAIR  2016   ventral  . HIP CLOSED REDUCTION Right 09/01/2018   Procedure: CLOSED REDUCTION HIP;  Surgeon: Nicholes Stairs, MD;  [facility];  Service: Orthopedics;  Laterality: Right;  . JOINT REPLACEMENT     b/l knees   . TOE AMPUTATION Right 09/2013  . TOTAL HIP ARTHROPLASTY Right 08/16/2018   Procedure: TOTAL HIP ARTHROPLASTY ANTERIOR APPROACH;  Surgeon: Rod Can, MD;  [facility];  Service: Orthopedics;  Laterality: Right;  . TOTAL HIP ARTHROPLASTY Left 11/02/2018   Procedure: TOTAL HIP ARTHROPLASTY ANTERIOR APPROACH;  Surgeon: Rod Can, MD;  [facility];  Service: Orthopedics;  Laterality: Left;  . TOTAL KNEE ARTHROPLASTY     bilat    There were no vitals filed for this visit.   Subjective Assessment - 04/05/19 0837    Subjective  Pt reports that he developed LBP and hip issues last August and has been sedintary since then.  He has put on weight and become very sedentary.  He is concerned about loss of  muscle tone.  Currently using a rollator since his hip replacements last year. Reports having some difficulty with Rt shoulder recently    Pertinent History  bilat TKA and THA, DM, LE edema,HTN    How long can you walk comfortably?  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  No/denies         [facility] - 04/05/19 0001      Assessment   Medical Diagnosis  Deconditioning    Referring Provider (PT)  Dr Cheryll CockayneStacy Burns    Onset Date/Surgical Date  05/18/18    Next MD Visit  PRN    Prior Therapy  nothing recently      Precautions   Precautions  None      Balance Screen   Has the patient fallen in the past 6 months  No    Has the patient had a decrease in activity level because of a  fear of falling?   Yes    Is the patient reluctant to leave their home because of a fear of falling?   Yes      Home Environment   Living Environment  Private residence    Living Arrangements  Spouse/significant other    Home Access  Stairs to enter   3, doing ok with railing   Home Layout  Two level   has not been able to go upstairs     Prior Function   Level of Independence  Independent   assist with socks/shoes   Vocation  Unemployed      Cognition   Overall Cognitive Status  Within Functional Limits for tasks assessed      Sensation   Proprioception  Impaired by gross assessment   bilat feet     ROM / Strength   AROM / PROM / Strength  AROM;Strength      AROM   AROM Assessment Site  Ankle;Hip    Right/Left Hip  --   bilat extension 10 degrees.    Right/Left Ankle  --   DF Lt 10, Rt 8     Strength   Overall Strength Comments  shoulder ER Rt 3-/5, Lt 4+/5    Strength Assessment Site  Hip;Knee;Ankle    Right/Left Hip  --   grossly 4-/5   Right/Left Knee  --   grossly 4+/5   Right/Left Ankle  --   grossly 4-/5     Transfers   Transfers  Sit to Stand    Sit to Stand  6: Modified independent (Device/Increase time)   has to brace legs against chair   Five time sit to stand comments   22 sec, with SOB      6 Minute Walk- Baseline   6 Minute Walk- Baseline  yes    HR (bpm)  78    02 Sat (%RA)  96 %      6 Minute walk- Post Test   6 Minute Walk Post Test  yes    HR (bpm)  93    02 Sat (%RA)  96 %    Perceived Rate of Exertion ([place])  17- Very hard      6 minute walk test results    Aerobic Endurance Distance Walked  410  Objective measurements completed on examination: See above findings.      [facility] Adult PT Treatment/Exercise - 04/05/19 0001      Exercises   Exercises  Other Exercises    Other Exercises   10 reps sit to stand, bilat shoulder ER with red band. , instruction in walking program in the house using music for time  walked.              PT Education - 04/05/19 0925    Education Details  HEP and POC    Person(s) Educated  Patient    Methods  Explanation;Demonstration;Verbal cues;Handout    Comprehension  Returned demonstration;Verbalized understanding          PT Long Term Goals - 04/05/19 1032      PT LONG TERM GOAL #1   Title  I with advanced HEP ( 05/17/2019)    Time  6    Period  Weeks    Status  New    Target Date  05/17/19      PT LONG TERM GOAL #2   Title  improve 5x sit to stand =/< 10 sec ( 05/17/2019)    Time  6    Period  Weeks    Status  New    Target Date  05/17/19      PT LONG TERM GOAL #3   Title  improve 6' walk test to be age appropriate [id number]' ( 05/17/2019)    Time  6    Period  Weeks    Status  New    Target Date  05/17/19      PT LONG TERM GOAL #4   Title  increase bilat hip strength =/> 5-/5 to assist with standing tolerance ( 05/17/2019)    Time  6    Period  Weeks    Status  New    Target Date  05/17/19      PT LONG TERM GOAL #5   Title  report =/> 75% improvement in fear of falling ( 05/17/2019)    Time  6    Period  Weeks    Status  New    Target Date  05/17/19             Plan - 04/05/19 0927    Clinical Impression Statement  58 yo male referred to PT for weakness and deconditioning. He has had bilat knees and hips replaced, the last hip in January of this year.  He has been very limited with activity and not had PT due to the coronavirus and places not being open.  He is weak in bilat LE's and has developed Rt shoulder pain most likely from using walker and taking a lot of weight through his UE's.  He is morbidly obese and has multple medical dx that will impact progress.  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  He would benefit from PT to improve strength and activity tolerance.   Currenly pain is not a limiting factor however fear of falling is.    Personal Factors and Comorbidities  Past/Current  Experience;Comorbidity 3+;Fitness    Examination-Activity Limitations  Bed Mobility;Squat;Stairs;Dressing;Transfers;Locomotion Level    Examination-Participation Restrictions  Community Activity;Driving    Stability/Clinical Decision Making  Evolving/Moderate complexity    Clinical Decision Making  Moderate    Rehab Potential  Good    PT Frequency  2x / week    PT Duration  6 weeks    PT  Treatment/Interventions  Functional mobility training;Patient/family education;Therapeutic activities;Moist Heat;Ultrasound;Cryotherapy;Electrical Stimulation;Gait training;Neuromuscular re-education;Manual techniques;Dry needling;Therapeutic exercise    PT Next Visit Plan  progress strengthening, balance and activity tolerance    Consulted and Agree with Plan of Care  Patient       Patient will benefit from skilled therapeutic intervention in order to improve the following deficits and impairments:  Abnormal gait, Decreased range of motion, Difficulty walking, Obesity, Impaired UE functional use, Decreased balance, Decreased strength, Increased edema  Visit Diagnosis: 1. Muscle weakness (generalized)   2. Difficulty in walking, not elsewhere classified   3. Other symptoms and signs involving the musculoskeletal system        Problem List Patient Active Problem List   Diagnosis Date Noted  . Anemia 02/06/2019  . Cold intolerance 02/06/2019  . Acute on chronic diastolic CHF (congestive heart failure) (HCC) 01/30/2019  . Type 2 diabetes mellitus with hyperlipidemia (HCC) 01/29/2019  . Avascular necrosis of hip, left (HCC) 11/02/2018  . Decreased hearing of both ears 10/30/2018  . Avascular necrosis of hip, right (HCC) 08/16/2018  . Bilateral leg edema 02/27/2018  . Diabetic foot infection (HCC) 02/16/2018  . Alcohol dependence syndrome (HCC) 02/16/2018  . Marijuana abuse 02/16/2018  . Diabetic ulcer of right midfoot associated with type 2 diabetes mellitus, with necrosis of muscle (HCC)  11/16/2017  . Foot ulcer (HCC) 12/21/2016  . Bilateral carpal tunnel syndrome 12/15/2016  . Fatty liver 12/14/2016  . Hyperlipidemia 11/25/2016  . Elevated liver enzymes 11/25/2016  . Diabetes (HCC) 11/25/2016  . Hypertension 11/23/2016  . Neuropathy 11/23/2016  . Toe amputation status, right 11/23/2016  . Morbid obesity (HCC) 11/23/2016  . OSA (obstructive sleep apnea) 11/23/2016  . Other hammer toe (acquired) 11/11/2013  . Exstrophy of bladder 11/11/2013    Roderic ScarceSusan [name] PT  04/05/2019, 10:38 AM  [facility] [address] [place], [place], [address] Phone: [phone]   Fax:  [phone]  Name: Fanny BienCraig Saari MRN: [id number] Date of Birth: 10/14/1961

## 2019-04-05 NOTE — Patient Instructions (Signed)
Access Code: [email or web address]  URL: [email or web address]  Date: 04/05/2019  Prepared by: Jeral Pinch   Exercises  Sit to Stand - 10-15 reps - 3x daily  Shoulder External Rotation and Scapular Retraction with Resistance - 10 reps - 3 sets - 1-2x daily  Patient Education  walking program

## 2019-04-09 ENCOUNTER — Encounter: Payer: Self-pay | Admitting: Physical Therapy

## 2019-04-09 ENCOUNTER — Ambulatory Visit: Payer: BLUE CROSS/BLUE SHIELD | Admitting: Physical Therapy

## 2019-04-09 ENCOUNTER — Other Ambulatory Visit: Payer: Self-pay

## 2019-04-09 DIAGNOSIS — R262 Difficulty in walking, not elsewhere classified: Secondary | ICD-10-CM

## 2019-04-09 DIAGNOSIS — M6281 Muscle weakness (generalized): Secondary | ICD-10-CM

## 2019-04-09 DIAGNOSIS — R29898 Other symptoms and signs involving the musculoskeletal system: Secondary | ICD-10-CM | POA: Diagnosis not present

## 2019-04-09 NOTE — Therapy (Addendum)
[place], [place], [address] Phone: [phone]   Fax:  [phone]  Physical Therapy Treatment/Discharge  Patient Details  Name: [name] MRN: [id number] Date of Birth: 09-27-1961 Referring Provider (PT): Dr Billey Gosling   Encounter Date: 04/09/2019  PT End of Session - 04/09/19 1322    Visit Number  2    Number of Visits  12    Date for PT Re-Evaluation  05/17/19    Authorization Type  BCBS - pt out of network, he is aware    PT Start Time  1234    PT Stop Time  1320    PT Time Calculation (min)  46 min    Activity Tolerance  Patient tolerated treatment well;Patient limited by fatigue    Behavior During Therapy  [facility] for tasks assessed/performed       Past Medical History:  Diagnosis Date  . Alcohol dependence ([place])   . Arthritis    hips, hands  . Bilateral carpal tunnel syndrome   . Bilateral leg edema    Chronic  . Diabetes mellitus without complication ([place])    type 2  . Diverticulitis    portion of colon removed  . DOE (dyspnea on exertion)    occ  . Elevated liver enzymes   . Fatty liver   . GERD (gastroesophageal reflux disease)    occ  . Hammer toe   . Hip pain   . History of ventral hernia repair 2016   x2  . Hyperlipidemia    pt unsure  . Hypertension   . Marijuana abuse   . Morbid obesity ([place])   . Neuromuscular disorder ([place])    peripheral neuropathy feet and few fingers  . OSA (obstructive sleep apnea)    has OSA-not used CPAP 2-3 yrs could not tolerate cpap  . PONV (postoperative nausea and vomiting)   . Toe ulcer ([place])    left healed    Past Surgical History:  Procedure Laterality Date  . AMPUTATION TOE Left 05/25/2018   Procedure: AMPUTATION TOE left 3rd;  Surgeon: Wylene Simmer, MD;  Location: [facility];  Service: Orthopedics;  Laterality: Left;  12mn, to follow  . AMPUTATION TOE Left 06/28/2018   Procedure: Left foot revision 3rd toe  amputation including 3rd metatarsal;  Surgeon: HWylene Simmer MD;  [place]  Service: Orthopedics;  Laterality: Left;  648m  . BICEPS TENDON REPAIR Left 2014   Partial  . COLONOSCOPY    . HERNIA REPAIR  2016   ventral  . HIP CLOSED REDUCTION Right 09/01/2018   Procedure: CLOSED REDUCTION HIP;  Surgeon: RoNicholes StairsMD;  [facility];  Service: Orthopedics;  Laterality: Right;  . JOINT REPLACEMENT     b/l knees   . TOE AMPUTATION Right 09/2013  . TOTAL HIP ARTHROPLASTY Right 08/16/2018   Procedure: TOTAL HIP ARTHROPLASTY ANTERIOR APPROACH;  Surgeon: SwRod CanMD;  [facility];  Service: Orthopedics;  Laterality: Right;  . TOTAL HIP ARTHROPLASTY Left 11/02/2018   Procedure: TOTAL HIP ARTHROPLASTY ANTERIOR APPROACH;  Surgeon: SwRod CanMD;  [facility];  Service: Orthopedics;  Laterality: Left;  . TOTAL KNEE ARTHROPLASTY     bilat    There were no vitals filed for this visit.  Subjective Assessment - 04/09/19 1327    Subjective  Did not sleep well last night. doing my walking and might try walking to 2 songs. Hard time scheduling as his  girlfriend drives him and he has to work around her schedule.                       [facility] Adult PT Treatment/Exercise - 04/09/19 0001      Exercises   Exercises  Knee/Hip      Knee/Hip Exercises: Stretches   Passive Hamstring Stretch Limitations  seated EOB      Knee/Hip Exercises: Standing   Other Standing Knee Exercises  glut sets      Knee/Hip Exercises: Seated   Long Arc Quad  Both;15 reps;2 sets    Long Arc Quad Limitations  iso adduction on ball    Heel Slides Limitations  heel slides on wash cloths, alternating feet    Other Seated Knee/Hip Exercises  isometric ER pressing into hands    Other Seated Knee/Hip Exercises  fwd hinge with reach holding wand             PT Education - 04/09/19 1325    Education Details  leg wrapping, rationale for  exercise, shoes & options- begin with podiatrist    Person(s) Educated  Patient    Methods  Explanation;Demonstration;Tactile cues;Verbal cues;Handout    Comprehension  Verbalized understanding;Returned demonstration;Verbal cues required;Tactile cues required;Need further instruction          PT Long Term Goals - 04/05/19 1032      PT LONG TERM GOAL #1   Title  I with advanced HEP ( 05/17/2019)    Time  6    Period  Weeks    Status  New    Target Date  05/17/19      PT LONG TERM GOAL #2   Title  improve 5x sit to stand =/< 10 sec ( 05/17/2019)    Time  6    Period  Weeks    Status  New    Target Date  05/17/19      PT LONG TERM GOAL #3   Title  improve 6' walk test to be age appropriate 78' ( 05/17/2019)    Time  6    Period  Weeks    Status  New    Target Date  05/17/19      PT LONG TERM GOAL #4   Title  increase bilat hip strength =/> 5-/5 to assist with standing tolerance ( 05/17/2019)    Time  6    Period  Weeks    Status  New    Target Date  05/17/19      PT LONG TERM GOAL #5   Title  report =/> 75% improvement in fear of falling ( 05/17/2019)    Time  6    Period  Weeks    Status  New    Target Date  05/17/19            Plan - 04/09/19 1323    Clinical Impression Statement  Good tolerance to exercises with monitored rest breaks required. Pt was able to stand from mat mid-session with minAx1 but required ModAx2 at end of session to stand. Pt asked about wrapping of legs- discussed contacting PCP that this would not be a problem with known heart issues and would need to go to clinic at [name].    PT Treatment/Interventions  Functional mobility training;Patient/family education;Therapeutic activities;Moist Heat;Ultrasound;Cryotherapy;Electrical Stimulation;Gait training;Neuromuscular re-education;Manual techniques;Dry needling;Therapeutic exercise    PT Next Visit Plan  progress strengthening, balance and activity tolerance    PT Home Exercise Plan  GHJ ER  red, walk x1 song, alt heel slides, LAQ with ADD, stand glut sets, seated hip hinge;    Consulted and Agree with Plan of Care  Patient       Patient will benefit from skilled therapeutic intervention in order to improve the following deficits and impairments:  Abnormal gait, Decreased range of motion, Difficulty walking, Obesity, Impaired UE functional use, Decreased balance, Decreased strength, Increased edema  Visit Diagnosis: 1. Muscle weakness (generalized)   2. Difficulty in walking, not elsewhere classified   3. Other symptoms and signs involving the musculoskeletal system        Problem List Patient Active Problem List   Diagnosis Date Noted  . Anemia 02/06/2019  . Cold intolerance 02/06/2019  . Acute on chronic diastolic CHF (congestive heart failure) ([place]) 01/30/2019  . Type 2 diabetes mellitus with hyperlipidemia ([place]) 01/29/2019  . Avascular necrosis of hip, left ([place]) 11/02/2018  . Decreased hearing of both ears 10/30/2018  . Avascular necrosis of hip, right ([place]) 08/16/2018  . Bilateral leg edema 02/27/2018  . Diabetic foot infection ([place]) 02/16/2018  . Alcohol dependence syndrome ([place]) 02/16/2018  . Marijuana abuse 02/16/2018  . Diabetic ulcer of right midfoot associated with type 2 diabetes mellitus, with necrosis of muscle ([place]) 11/16/2017  . Foot ulcer ([place]) 12/21/2016  . Bilateral carpal tunnel syndrome 12/15/2016  . Fatty liver 12/14/2016  . Hyperlipidemia 11/25/2016  . Elevated liver enzymes 11/25/2016  . Diabetes ([place]) 11/25/2016  . Hypertension 11/23/2016  . Neuropathy 11/23/2016  . Toe amputation status, right 11/23/2016  . Morbid obesity ([place]) 11/23/2016  . OSA (obstructive sleep apnea) 11/23/2016  . Other hammer toe (acquired) 11/11/2013  . Exstrophy of bladder 11/11/2013   Zipporah Finamore C. Tifani Dack PT, DPT 04/09/19 1:28 PM   [facility] [address] [place], [place], [address] Phone:  [phone]   Fax:  [phone]  Name: Jarett Dralle MRN: [id number] Date of Birth: 1961-07-30  PHYSICAL THERAPY DISCHARGE SUMMARY  Visits from Start of Care: 2  Current functional level related to goals / functional outcomes: See above   Remaining deficits: See above   Education / Equipment: Anatomy of condition, POC, HEP, exercise form/rationale  Plan: Patient agrees to discharge.  Patient goals were not met. Patient is being discharged due to not returning since the last visit.  ?????    Transportation difficulties.  Jerine Surles C. Theone Bowell PT, DPT 06/19/19 5:21 PM

## 2019-04-13 ENCOUNTER — Encounter

## 2019-04-19 ENCOUNTER — Other Ambulatory Visit: Payer: Self-pay | Admitting: Internal Medicine

## 2019-04-19 ENCOUNTER — Encounter: Payer: Self-pay | Admitting: Internal Medicine

## 2019-04-19 NOTE — Telephone Encounter (Signed)
Patient does have SOB when moving around.  He is refusing to go to Urgent Care.  He wants to wait to see Dr. Quay Burow.  I have scheduled pt for 7/6 w/ Burns.

## 2019-04-22 DIAGNOSIS — E039 Hypothyroidism, unspecified: Secondary | ICD-10-CM | POA: Insufficient documentation

## 2019-04-22 DIAGNOSIS — R0609 Other forms of dyspnea: Secondary | ICD-10-CM | POA: Insufficient documentation

## 2019-04-22 DIAGNOSIS — R0600 Dyspnea, unspecified: Secondary | ICD-10-CM | POA: Insufficient documentation

## 2019-04-22 NOTE — Patient Instructions (Addendum)
  Tests ordered today. Your results will be released to [facility] (or called to you) after review, usually within 72hours after test completion. If any changes need to be made, you will be notified at that same time.   Medications reviewed and updated.  Changes include :  Stop furosemide and start torsemide.    Your prescription(s) have been submitted to your pharmacy. Please take as directed and contact our office if you believe you are having problem(s) with the medication(s).

## 2019-04-22 NOTE — Progress Notes (Signed)
Subjective:    Patient ID: [name], male    DOB: Mar 02, 1961, 58 y.o.   MRN: [id number]  HPI The patient is here for an acute visit.  Chronic diastolic heart failure, fluid build up, SOB with exertion, edema:  He is retaining a lot of fluid in his legs, stomach, and hands sometimes.  He is having increased SOB.  He thinks this started 2-3 weeks ago.  He is taking his lasix as prescribed - he takes 40 mg twice daily.  He urinates often but only a small amount.  He is compliant with a low sodium diet.  He has had a cough in the morning and in the evening, but feels this is related to allergies.  He denies any wheezing, chest pain or palpitations.  He denies fevers.   Alcohol abuse: He is drinking a lot of alcohol - he states two drinks every other day, but his girlfriend states he drinks much more.  He thinks he will need to go into rehab in order to quit completely-he is tried at home unsuccessfully.      Medications and allergies reviewed with patient and updated if appropriate.  Patient Active Problem List   Diagnosis Date Noted  . DOE (dyspnea on exertion) 04/22/2019  . Hypothyroidism 04/22/2019  . Anemia 02/06/2019  . Cold intolerance 02/06/2019  . Acute on chronic diastolic CHF (congestive heart failure) ([place]) 01/30/2019  . Type 2 diabetes mellitus with hyperlipidemia ([place]) 01/29/2019  . Avascular necrosis of hip, left ([place]) 11/02/2018  . Decreased hearing of both ears 10/30/2018  . Avascular necrosis of hip, right ([place]) 08/16/2018  . Bilateral leg edema 02/27/2018  . Diabetic foot infection ([place]) 02/16/2018  . Alcohol dependence syndrome ([place]) 02/16/2018  . Marijuana abuse 02/16/2018  . Diabetic ulcer of right midfoot associated with type 2 diabetes mellitus, with necrosis of muscle ([place]) 11/16/2017  . Foot ulcer ([place]) 12/21/2016  . Bilateral carpal tunnel syndrome 12/15/2016  . Fatty liver 12/14/2016  . Hyperlipidemia 11/25/2016  . Elevated liver enzymes  11/25/2016  . Diabetes ([place]) 11/25/2016  . Hypertension 11/23/2016  . Neuropathy 11/23/2016  . Toe amputation status, right 11/23/2016  . Morbid obesity ([place]) 11/23/2016  . OSA (obstructive sleep apnea) 11/23/2016  . Other hammer toe (acquired) 11/11/2013  . Exstrophy of bladder 11/11/2013    Current Outpatient Medications on File Prior to Visit  Medication Sig Dispense Refill  . aspirin 81 MG chewable tablet Chew 1 tablet (81 mg total) by mouth 2 (two) times daily. 60 tablet 1  . calcium carbonate (TUMS - DOSED IN MG ELEMENTAL CALCIUM) 500 MG chewable tablet Chew 2 tablets by mouth daily as needed for indigestion or heartburn.    . diphenhydrAMINE (BENADRYL ALLERGY) 25 MG tablet Take 25 mg by mouth daily.    Marland Kitchen docusate sodium (COLACE) 100 MG capsule Take 1 capsule (100 mg total) by mouth 2 (two) times daily. (Patient taking differently: Take 100 mg by mouth daily as needed for mild constipation. ) 60 capsule 1  . DULoxetine (CYMBALTA) 20 MG capsule Take 1 capsule (20 mg total) by mouth daily. 90 capsule 1  . levothyroxine (SYNTHROID) 25 MCG tablet TAKE 1 TABLET (25 MCG TOTAL) BY MOUTH DAILY BEFORE BREAKFAST. 30 tablet 0  . metoprolol tartrate (LOPRESSOR) 100 MG tablet TAKE 1 TABLET (100 MG TOTAL) BY MOUTH 2 (TWO) TIMES DAILY. (Patient taking differently: Take 100 mg by mouth See admin instructions. "twice daily every other day") 180 tablet 3  .  Multiple Vitamin (MULTIVITAMIN WITH MINERALS) TABS tablet Take 1 tablet by mouth daily. 30 tablet 0  . potassium chloride SA (KLOR-CON M20) 20 MEQ tablet Take 2 tablets (40 mEq total) by mouth 2 (two) times daily. 360 tablet 0  . senna (SENOKOT) 8.6 MG TABS tablet Take 1 tablet (8.6 mg total) by mouth 2 (two) times daily. 120 each 0   No current facility-administered medications on file prior to visit.     Past Medical History:  Diagnosis Date  . Alcohol dependence (HCC)   . Arthritis    hips, hands  . Bilateral carpal tunnel syndrome   .  Bilateral leg edema    Chronic  . Diabetes mellitus without complication (HCC)    type 2  . Diverticulitis    portion of colon removed  . DOE (dyspnea on exertion)    occ  . Elevated liver enzymes   . Fatty liver   . GERD (gastroesophageal reflux disease)    occ  . Hammer toe   . Hip pain   . History of ventral hernia repair 2016   x2  . Hyperlipidemia    pt unsure  . Hypertension   . Marijuana abuse   . Morbid obesity (HCC)   . Neuromuscular disorder (HCC)    peripheral neuropathy feet and few fingers  . OSA (obstructive sleep apnea)    has OSA-not used CPAP 2-3 yrs could not tolerate cpap  . PONV (postoperative nausea and vomiting)   . Toe ulcer (HCC)    left healed    Past Surgical History:  Procedure Laterality Date  . AMPUTATION TOE Left 05/25/2018   Procedure: AMPUTATION TOE left 3rd;  Surgeon: Toni ArthursHewitt, John, MD;  Location: [facility];  Service: Orthopedics;  Laterality: Left;  60min, to follow  . AMPUTATION TOE Left 06/28/2018   Procedure: Left foot revision 3rd toe amputation including 3rd metatarsal;  Surgeon: Toni ArthursHewitt, John, MD;  Location: [facility];  Service: Orthopedics;  Laterality: Left;  60min  . BICEPS TENDON REPAIR Left 2014   Partial  . COLONOSCOPY    . HERNIA REPAIR  2016   ventral  . HIP CLOSED REDUCTION Right 09/01/2018   Procedure: CLOSED REDUCTION HIP;  Surgeon: Yolonda Kidaogers, Jason Patrick, MD;  Location: [facility];  Service: Orthopedics;  Laterality: Right;  . JOINT REPLACEMENT     b/l knees   . TOE AMPUTATION Right 09/2013  . TOTAL HIP ARTHROPLASTY Right 08/16/2018   Procedure: TOTAL HIP ARTHROPLASTY ANTERIOR APPROACH;  Surgeon: Samson FredericSwinteck, Brian, MD;  Location: [facility];  Service: Orthopedics;  Laterality: Right;  . TOTAL HIP ARTHROPLASTY Left 11/02/2018   Procedure: TOTAL HIP ARTHROPLASTY ANTERIOR APPROACH;  Surgeon: Samson FredericSwinteck, Brian, MD;  Location: [facility];  Service: Orthopedics;  Laterality: Left;  . TOTAL KNEE ARTHROPLASTY      bilat    Social History   Socioeconomic History  . Marital status: Divorced    Spouse name: Not on file  . Number of children: Not on file  . Years of education: Not on file  . Highest education level: Not on file  Occupational History  . Occupation: unemployed, Press photographerfiling for disability  Social Needs  . Financial resource strain: Not on file  . Food insecurity    Worry: Not on file    Inability: Not on file  . Transportation needs    Medical: Not on file    Non-medical: Not on file  Tobacco Use  . Smoking status: Former Smoker  Packs/day: 0.25    Years: 10.00    Pack years: 2.50    Types: Cigarettes  . Smokeless tobacco: Never Used  . Tobacco comment: quit 2018  Substance and Sexual Activity  . Alcohol use: Yes    Comment: 2-3 drinks 4-5 nights a week, liquor  . Drug use: Yes    Frequency: 1.0 times per week    Types: Marijuana    Comment: "4 times a month, maybe"  . Sexual activity: Not on file  Lifestyle  . Physical activity    Days per week: Not on file    Minutes per session: Not on file  . Stress: Not on file  Relationships  . Social Musicianconnections    Talks on phone: Not on file    Gets together: Not on file    Attends religious service: Not on file    Active member of club or organization: Not on file    Attends meetings of clubs or organizations: Not on file    Relationship status: Not on file  Other Topics Concern  . Not on file  Social History Narrative  . Not on file    Family History  Adopted: Yes  Family history unknown: Yes    Review of Systems  Constitutional: Negative for chills and fever.  Respiratory: Positive for cough (in morning and at night - ? allergies - PND) and shortness of breath. Negative for wheezing.   Cardiovascular: Positive for leg swelling. Negative for chest pain and palpitations.  Gastrointestinal: Positive for abdominal distention and diarrhea (occ). Negative for abdominal pain and nausea.  Endocrine: Positive for cold  intolerance.  Neurological: Negative for light-headedness and headaches.       Objective:   Vitals:   04/23/19 1004  BP: (!) 144/74  Pulse: 85  Resp: 18  Temp: 98.6 F (37 C)  SpO2: 97%   BP Readings from Last 3 Encounters:  04/23/19 (!) 144/74  02/06/19 140/78  02/02/19 (!) 99/47   Wt Readings from Last 3 Encounters:  04/23/19 (!) 362 lb (164.2 kg)  02/06/19 (!) 335 lb (152 kg)  01/29/19 280 lb (127 kg)   Body mass index is 47.76 kg/m.   Physical Exam    Constitutional: Appears well-developed and well-nourished.  Short of breath with minimal exertion.  HENT:  Head: Normocephalic and atraumatic.  Neck: Neck supple. No tracheal deviation present. No thyromegaly present.  No cervical lymphadenopathy Cardiovascular: Normal rate, regular rhythm and normal heart sounds.  No murmur heard. No carotid bruit .  3+ bilateral lower extremity pitting edema Pulmonary/Chest: Effort normal and breath sounds normal. No respiratory distress. No has no wheezes.  Minimal bibasilar rales.  Abdomen: Distended-firm-appears to be fluid Skin: Skin is warm and dry. Not diaphoretic.  Psychiatric: Normal mood and affect. Behavior is normal.       Assessment & Plan:    See Problem List for Assessment and Plan of chronic medical problems.

## 2019-04-23 ENCOUNTER — Telehealth: Payer: Self-pay | Admitting: Internal Medicine

## 2019-04-23 ENCOUNTER — Other Ambulatory Visit: Payer: Self-pay

## 2019-04-23 ENCOUNTER — Other Ambulatory Visit (INDEPENDENT_AMBULATORY_CARE_PROVIDER_SITE_OTHER): Payer: Medicare Other

## 2019-04-23 ENCOUNTER — Encounter: Payer: Self-pay | Admitting: Internal Medicine

## 2019-04-23 ENCOUNTER — Ambulatory Visit (INDEPENDENT_AMBULATORY_CARE_PROVIDER_SITE_OTHER): Payer: Medicare Other | Admitting: Internal Medicine

## 2019-04-23 VITALS — BP 144/74 | HR 85 | Temp 98.6°F | Resp 18 | Ht 73.0 in | Wt 362.0 lb

## 2019-04-23 DIAGNOSIS — I5033 Acute on chronic diastolic (congestive) heart failure: Secondary | ICD-10-CM

## 2019-04-23 DIAGNOSIS — R0609 Other forms of dyspnea: Secondary | ICD-10-CM

## 2019-04-23 DIAGNOSIS — R600 Localized edema: Secondary | ICD-10-CM | POA: Diagnosis not present

## 2019-04-23 DIAGNOSIS — R0600 Dyspnea, unspecified: Secondary | ICD-10-CM

## 2019-04-23 DIAGNOSIS — E1142 Type 2 diabetes mellitus with diabetic polyneuropathy: Secondary | ICD-10-CM

## 2019-04-23 DIAGNOSIS — E039 Hypothyroidism, unspecified: Secondary | ICD-10-CM

## 2019-04-23 DIAGNOSIS — F1029 Alcohol dependence with unspecified alcohol-induced disorder: Secondary | ICD-10-CM

## 2019-04-23 LAB — CBC WITH DIFFERENTIAL/PLATELET
Basophils Absolute: 0.2 10*3/uL — ABNORMAL HIGH (ref 0.0–0.1)
Basophils Relative: 1.5 % (ref 0.0–3.0)
Eosinophils Absolute: 0.1 10*3/uL (ref 0.0–0.7)
Eosinophils Relative: 0.9 % (ref 0.0–5.0)
HCT: 33.8 % — ABNORMAL LOW (ref 39.0–52.0)
Hemoglobin: 11.4 g/dL — ABNORMAL LOW (ref 13.0–17.0)
Lymphocytes Relative: 15.9 % (ref 12.0–46.0)
Lymphs Abs: 1.7 10*3/uL (ref 0.7–4.0)
MCHC: 33.7 g/dL (ref 30.0–36.0)
MCV: 101.7 fl — ABNORMAL HIGH (ref 78.0–100.0)
Monocytes Absolute: 1 10*3/uL (ref 0.1–1.0)
Monocytes Relative: 9.9 % (ref 3.0–12.0)
Neutro Abs: 7.5 10*3/uL (ref 1.4–7.7)
Neutrophils Relative %: 71.8 % (ref 43.0–77.0)
Platelets: 277 10*3/uL (ref 150.0–400.0)
RBC: 3.33 Mil/uL — ABNORMAL LOW (ref 4.22–5.81)
RDW: 16.2 % — ABNORMAL HIGH (ref 11.5–15.5)
WBC: 10.5 10*3/uL (ref 4.0–10.5)

## 2019-04-23 LAB — COMPREHENSIVE METABOLIC PANEL
ALT: 13 U/L (ref 0–53)
AST: 38 U/L — ABNORMAL HIGH (ref 0–37)
Albumin: 3 g/dL — ABNORMAL LOW (ref 3.5–5.2)
Alkaline Phosphatase: 226 U/L — ABNORMAL HIGH (ref 39–117)
BUN: 6 mg/dL (ref 6–23)
CO2: 25 mEq/L (ref 19–32)
Calcium: 8.5 mg/dL (ref 8.4–10.5)
Chloride: 96 mEq/L (ref 96–112)
Creatinine, Ser: 0.71 mg/dL (ref 0.40–1.50)
GFR: 114.05 mL/min (ref >60.00)
Glucose, Bld: 121 mg/dL — ABNORMAL HIGH (ref 70–99)
Potassium: 2.8 mEq/L — CL (ref 3.5–5.1)
Sodium: 136 mEq/L (ref 135–145)
Total Bilirubin: 3.1 mg/dL — ABNORMAL HIGH (ref 0.2–1.2)
Total Protein: 7.5 g/dL (ref 6.0–8.3)

## 2019-04-23 LAB — BRAIN NATRIURETIC PEPTIDE: Pro B Natriuretic peptide (BNP): 505 pg/mL — ABNORMAL HIGH (ref 0.0–100.0)

## 2019-04-23 LAB — TSH: TSH: 6.49 u[IU]/mL — ABNORMAL HIGH (ref 0.35–4.50)

## 2019-04-23 LAB — HEMOGLOBIN A1C: Hgb A1c MFr Bld: 5 % (ref 4.6–6.5)

## 2019-04-23 MED ORDER — TORSEMIDE 20 MG PO TABS: 20.0000 mg | ORAL_TABLET | Freq: Two times a day (BID) | ORAL | 0 refills | Status: DC

## 2019-04-23 MED ORDER — LEVOTHYROXINE SODIUM 50 MCG PO TABS: 50.0000 ug | ORAL_TABLET | Freq: Every day | ORAL | 0 refills | Status: DC

## 2019-04-23 NOTE — Telephone Encounter (Signed)
Lab called to report a critical lab: Potassium- 2.8

## 2019-04-23 NOTE — Telephone Encounter (Signed)
Thyroid level is still low-increase levothyroxine to 50 mcg daily.  Potassium is low.  Make sure he is taking the potassium-they are large pills.  If he is taking them daily then we will need to increase the dose, if not then he has to start taking them-low potassium can cause heart problems.  His kidney function is normal.  Liver test slightly elevated.  His anemia is mild and stable.  Sugars still in normal range.

## 2019-04-23 NOTE — Telephone Encounter (Signed)
Pt aware of results. States he takes 2 of the potassium in the morning and 2 in the evening. Please advise on changes to be made. New rx for levothyroxine sent to pharmacy.

## 2019-04-23 NOTE — Assessment & Plan Note (Signed)
Related to acute on chronic diastolic heart failure Plan as above

## 2019-04-23 NOTE — Assessment & Plan Note (Signed)
Related to acute on chronic HF Plan as above

## 2019-04-23 NOTE — Assessment & Plan Note (Signed)
Stressed alcohol abstinence - I agree with him after attempts at home to taper down his alcohol that he likely needs rehab or to be hospitalized - he plans on going to the hospital later this week to be diuresed and will need an alcohol withdrawal protocol

## 2019-04-23 NOTE — Telephone Encounter (Signed)
Increase to 2 pills three times a day

## 2019-04-23 NOTE — Assessment & Plan Note (Signed)
Still experiencing cold intolerance TSH was elevated last time was checked Will recheck TSH and adjust medication

## 2019-04-23 NOTE — Assessment & Plan Note (Signed)
Has never had an a1c > 6.5%   No longer on medication Check a1c

## 2019-04-23 NOTE — Assessment & Plan Note (Signed)
He is significantly fluid overloaded-he states this started 2-3 weeks ago He has abdominal distention, intermittent hand swelling and 3+ lower extremity edema We discussed options any does want to be admitted to be diuresed and to be on an alcohol withdrawal protocol cannot go into the hospital for a few days because of animals at home-he will make arrangements and most likely go to the emergency room later this week We will check CMP, CBC, BNP Frusemide not effective-we will change to torsemide 20 mg twice daily Continue potassium Advised him to call with any questions or concerns

## 2019-04-24 ENCOUNTER — Inpatient Hospital Stay (HOSPITAL_COMMUNITY)
Admission: EM | Admit: 2019-04-24 | Discharge: 2019-04-27 | DRG: 641 | Disposition: A | Discharge status: Home / Self Care | Payer: BLUE CROSS/BLUE SHIELD | Attending: Internal Medicine | Admitting: Internal Medicine

## 2019-04-24 ENCOUNTER — Other Ambulatory Visit: Payer: Self-pay

## 2019-04-24 ENCOUNTER — Emergency Department (HOSPITAL_COMMUNITY): Payer: BLUE CROSS/BLUE SHIELD

## 2019-04-24 ENCOUNTER — Encounter (HOSPITAL_COMMUNITY): Payer: Self-pay

## 2019-04-24 DIAGNOSIS — Z96643 Presence of artificial hip joint, bilateral: Secondary | ICD-10-CM | POA: Diagnosis present

## 2019-04-24 DIAGNOSIS — I5033 Acute on chronic diastolic (congestive) heart failure: Secondary | ICD-10-CM | POA: Diagnosis not present

## 2019-04-24 DIAGNOSIS — T501X5A Adverse effect of loop [high-ceiling] diuretics, initial encounter: Secondary | ICD-10-CM | POA: Diagnosis present

## 2019-04-24 DIAGNOSIS — I10 Essential (primary) hypertension: Secondary | ICD-10-CM | POA: Diagnosis present

## 2019-04-24 DIAGNOSIS — E1142 Type 2 diabetes mellitus with diabetic polyneuropathy: Secondary | ICD-10-CM | POA: Diagnosis present

## 2019-04-24 DIAGNOSIS — Z96653 Presence of artificial knee joint, bilateral: Secondary | ICD-10-CM | POA: Diagnosis present

## 2019-04-24 DIAGNOSIS — I890 Lymphedema, not elsewhere classified: Secondary | ICD-10-CM | POA: Diagnosis present

## 2019-04-24 DIAGNOSIS — Z87891 Personal history of nicotine dependence: Secondary | ICD-10-CM

## 2019-04-24 DIAGNOSIS — K219 Gastro-esophageal reflux disease without esophagitis: Secondary | ICD-10-CM | POA: Diagnosis present

## 2019-04-24 DIAGNOSIS — I272 Pulmonary hypertension, unspecified: Secondary | ICD-10-CM | POA: Diagnosis present

## 2019-04-24 DIAGNOSIS — I350 Nonrheumatic aortic (valve) stenosis: Secondary | ICD-10-CM | POA: Diagnosis present

## 2019-04-24 DIAGNOSIS — Z20828 Contact with and (suspected) exposure to other viral communicable diseases: Secondary | ICD-10-CM | POA: Diagnosis not present

## 2019-04-24 DIAGNOSIS — K761 Chronic passive congestion of liver: Secondary | ICD-10-CM | POA: Diagnosis present

## 2019-04-24 DIAGNOSIS — E119 Type 2 diabetes mellitus without complications: Secondary | ICD-10-CM

## 2019-04-24 DIAGNOSIS — K7030 Alcoholic cirrhosis of liver without ascites: Secondary | ICD-10-CM

## 2019-04-24 DIAGNOSIS — E8770 Fluid overload, unspecified: Principal | ICD-10-CM | POA: Diagnosis present

## 2019-04-24 DIAGNOSIS — E11621 Type 2 diabetes mellitus with foot ulcer: Secondary | ICD-10-CM | POA: Diagnosis present

## 2019-04-24 DIAGNOSIS — R109 Unspecified abdominal pain: Secondary | ICD-10-CM

## 2019-04-24 DIAGNOSIS — F1020 Alcohol dependence, uncomplicated: Secondary | ICD-10-CM

## 2019-04-24 DIAGNOSIS — F1099 Alcohol use, unspecified with unspecified alcohol-induced disorder: Secondary | ICD-10-CM | POA: Diagnosis not present

## 2019-04-24 DIAGNOSIS — E662 Morbid (severe) obesity with alveolar hypoventilation: Secondary | ICD-10-CM

## 2019-04-24 DIAGNOSIS — R0600 Dyspnea, unspecified: Secondary | ICD-10-CM | POA: Diagnosis present

## 2019-04-24 DIAGNOSIS — F329 Major depressive disorder, single episode, unspecified: Secondary | ICD-10-CM | POA: Diagnosis present

## 2019-04-24 DIAGNOSIS — F32A Depression, unspecified: Secondary | ICD-10-CM | POA: Diagnosis present

## 2019-04-24 DIAGNOSIS — Z89422 Acquired absence of other left toe(s): Secondary | ICD-10-CM

## 2019-04-24 DIAGNOSIS — L97529 Non-pressure chronic ulcer of other part of left foot with unspecified severity: Secondary | ICD-10-CM | POA: Diagnosis present

## 2019-04-24 DIAGNOSIS — R0609 Other forms of dyspnea: Secondary | ICD-10-CM | POA: Diagnosis present

## 2019-04-24 DIAGNOSIS — I110 Hypertensive heart disease with heart failure: Secondary | ICD-10-CM | POA: Diagnosis not present

## 2019-04-24 DIAGNOSIS — E785 Hyperlipidemia, unspecified: Secondary | ICD-10-CM | POA: Diagnosis present

## 2019-04-24 DIAGNOSIS — Z6841 Body Mass Index (BMI) 40.0 and over, adult: Secondary | ICD-10-CM

## 2019-04-24 DIAGNOSIS — Z7982 Long term (current) use of aspirin: Secondary | ICD-10-CM

## 2019-04-24 DIAGNOSIS — I517 Cardiomegaly: Secondary | ICD-10-CM | POA: Diagnosis not present

## 2019-04-24 DIAGNOSIS — L97429 Non-pressure chronic ulcer of left heel and midfoot with unspecified severity: Secondary | ICD-10-CM | POA: Diagnosis present

## 2019-04-24 DIAGNOSIS — E039 Hypothyroidism, unspecified: Secondary | ICD-10-CM | POA: Diagnosis present

## 2019-04-24 DIAGNOSIS — G4733 Obstructive sleep apnea (adult) (pediatric): Secondary | ICD-10-CM | POA: Diagnosis present

## 2019-04-24 DIAGNOSIS — E876 Hypokalemia: Secondary | ICD-10-CM | POA: Diagnosis present

## 2019-04-24 LAB — BASIC METABOLIC PANEL
Anion gap: 12 (ref 5–15)
BUN: <5 mg/dL — ABNORMAL LOW (ref 6–20)
CO2: 26 mmol/L (ref 22–32)
Calcium: 8.3 mg/dL — ABNORMAL LOW (ref 8.9–10.3)
Chloride: 97 mmol/L — ABNORMAL LOW (ref 98–111)
Creatinine, Ser: 0.57 mg/dL — ABNORMAL LOW (ref 0.61–1.24)
GFR calc Af Amer: >60 mL/min (ref >60)
GFR calc non Af Amer: >60 mL/min (ref >60)
Glucose, Bld: 124 mg/dL — ABNORMAL HIGH (ref 70–99)
Potassium: 2.7 mmol/L — CL (ref 3.5–5.1)
Sodium: 135 mmol/L (ref 135–145)

## 2019-04-24 LAB — BRAIN NATRIURETIC PEPTIDE: B Natriuretic Peptide: 165.3 pg/mL — ABNORMAL HIGH (ref 0.0–100.0)

## 2019-04-24 LAB — CBC
HCT: 34.7 % — ABNORMAL LOW (ref 39.0–52.0)
Hemoglobin: 11 g/dL — ABNORMAL LOW (ref 13.0–17.0)
MCH: 33.2 pg (ref 26.0–34.0)
MCHC: 31.7 g/dL (ref 30.0–36.0)
MCV: 104.8 fL — ABNORMAL HIGH (ref 80.0–100.0)
Platelets: 270 10*3/uL (ref 150–400)
RBC: 3.31 MIL/uL — ABNORMAL LOW (ref 4.22–5.81)
RDW: 15.3 % (ref 11.5–15.5)
WBC: 9 10*3/uL (ref 4.0–10.5)
nRBC: 0 % (ref 0.0–0.2)

## 2019-04-24 MED ORDER — SODIUM CHLORIDE 0.9% FLUSH: 3.0000 mL | Freq: Once | INTRAVENOUS | Status: DC

## 2019-04-24 MED ORDER — LORAZEPAM 1 MG PO TABS
0.0000 mg | ORAL_TABLET | Freq: Four times a day (QID) | ORAL | Status: AC
  Administered 2019-04-26: 1 mg via ORAL
  Filled 2019-04-24: qty 1

## 2019-04-24 MED ORDER — LORAZEPAM 2 MG/ML IJ SOLN: 0.0000 mg | Freq: Two times a day (BID) | INTRAMUSCULAR | Status: DC

## 2019-04-24 MED ORDER — POTASSIUM CHLORIDE CRYS ER 20 MEQ PO TBCR
40.0000 meq | EXTENDED_RELEASE_TABLET | Freq: Once | ORAL | Status: AC
  Administered 2019-04-25: 40 meq via ORAL
  Filled 2019-04-24: qty 2

## 2019-04-24 MED ORDER — LORAZEPAM 2 MG/ML IJ SOLN
0.0000 mg | Freq: Four times a day (QID) | INTRAMUSCULAR | Status: AC
  Administered 2019-04-25 – 2019-04-26 (×2): 1 mg via INTRAVENOUS
  Filled 2019-04-24 (×2): qty 1

## 2019-04-24 MED ORDER — POTASSIUM CHLORIDE 10 MEQ/100ML IV SOLN
10.0000 meq | INTRAVENOUS | Status: AC
  Administered 2019-04-25 (×2): 10 meq via INTRAVENOUS
  Filled 2019-04-24 (×2): qty 100

## 2019-04-24 MED ORDER — THIAMINE HCL 100 MG/ML IJ SOLN: 100.0000 mg | Freq: Every day | INTRAMUSCULAR | Status: DC

## 2019-04-24 MED ORDER — VITAMIN B-1 100 MG PO TABS
100.0000 mg | ORAL_TABLET | Freq: Every day | ORAL | Status: DC
  Administered 2019-04-25 – 2019-04-27 (×3): 100 mg via ORAL
  Filled 2019-04-24 (×3): qty 1

## 2019-04-24 MED ORDER — FUROSEMIDE 10 MG/ML IJ SOLN
40.0000 mg | Freq: Once | INTRAMUSCULAR | Status: AC
  Administered 2019-04-25: 40 mg via INTRAVENOUS
  Filled 2019-04-24: qty 4

## 2019-04-24 MED ORDER — LORAZEPAM 1 MG PO TABS
0.0000 mg | ORAL_TABLET | Freq: Two times a day (BID) | ORAL | Status: DC
  Administered 2019-04-27: 1 mg via ORAL
  Filled 2019-04-24: qty 1

## 2019-04-24 NOTE — ED Triage Notes (Signed)
Upon reassessment pt states that he's afraid he may start going through DT's, he said his doctor was suppose to send something over about that, he states that he last drank last night and feels like he's starting to shake

## 2019-04-24 NOTE — ED Triage Notes (Signed)
Pt sent by PCP (Dr Quay Burow) . Pt states she has fluid through his legs and abdomen. Hx of CHF. Swelling in abd for 6 mos

## 2019-04-24 NOTE — ED Provider Notes (Signed)
[facility] DEPT Provider Note   CSN: [id number] Arrival date & time: 04/24/19  1453    History   Chief Complaint Chief Complaint  Patient presents with  . Leg Swelling    HPI [name] is a 58 y.o. male with a history of history of chronic diastolic heart failure, alcohol dependence, chronic bilateral lower extremity edema, morbid obesity, and bilateral hip pain s/p bilateral total hip arthroplasty who presents to the emergency department with a chief complaint of abdominal swelling.  The patient endorses constant, worsening abdominal swelling over the last few weeks.  He was seen by his PCP yesterday and was found that his weight had increased from 152 kg to 165.2 kg from April 21 to July 6 spite the patient taking 40 mg of Lasix twice daily.  He also reports associated leg swelling to the level of his thighs, but reports that this is chronic.  He reports an associated nonproductive cough and shortness of breath.  No chest pain, nausea, vomiting, diarrhea, fever, chills, abdominal pain, urinary symptoms, or weakness.  He was seen yesterday by his PCP and the dosage of his home diuretics was increased, but he is on no significant improvement.  He also reports that he drinks approximately 1/2 gallon of liquor per week.  He denies a history of DTs, seizures from withdrawal.  He thinks his last time he was able to go more than a couple days without drinking was when he had his left hip replacement in February.       The history is provided by the patient. No language interpreter was used.    Past Medical History:  Diagnosis Date  . Alcohol dependence ([place])   . Arthritis    hips, hands  . Bilateral carpal tunnel syndrome   . Bilateral leg edema    Chronic  . Diabetes mellitus without complication ([facility])    type 2  . Diverticulitis    portion of colon removed  . DOE (dyspnea on exertion)    occ  . Elevated liver enzymes   . Fatty liver   .  GERD (gastroesophageal reflux disease)    occ  . Hammer toe   . Hip pain   . History of ventral hernia repair 2016   x2  . Hyperlipidemia    pt unsure  . Hypertension   . Marijuana abuse   . Morbid obesity ([place])   . Neuromuscular disorder ([place])    peripheral neuropathy feet and few fingers  . OSA (obstructive sleep apnea)    has OSA-not used CPAP 2-3 yrs could not tolerate cpap  . PONV (postoperative nausea and vomiting)   . Toe ulcer ([place])    left healed    Patient Active Problem List   Diagnosis Date Noted  . Depression 04/25/2019  . Acute on chronic diastolic (congestive) heart failure ([place]) 04/25/2019  . Diabetes mellitus without complication ([place]) 76/72/0947  . Severe alcohol use disorder ([place])   . DOE (dyspnea on exertion) 04/22/2019  . Hypothyroidism 04/22/2019  . Anemia 02/06/2019  . Cold intolerance 02/06/2019  . Acute on chronic diastolic CHF (congestive heart failure) ([place]) 01/30/2019  . Type 2 diabetes mellitus with hyperlipidemia ([place]) 01/29/2019  . Avascular necrosis of hip, left ([place]) 11/02/2018  . Decreased hearing of both ears 10/30/2018  . Avascular necrosis of hip, right ([place]) 08/16/2018  . Bilateral leg edema 02/27/2018  . Diabetic foot infection ([place]) 02/16/2018  . Alcohol dependence syndrome ([place]) 02/16/2018  . Marijuana  abuse 02/16/2018  . Diabetic ulcer of right midfoot associated with type 2 diabetes mellitus, with necrosis of muscle (HCC) 11/16/2017  . Foot ulcer (H[facility]) 12/21/2016  . Bilateral carpal tunnel syndrome 12/15/2016  . Fatty liver 12/14/2016  . Hyperlipidemia 11/25/2016  . Elevated liver enzymes 11/25/2016  . Diabetes (H[facility]) 11/25/2016  . Hypertension 11/23/2016  . Neuropathy 11/23/2016  . Toe amputation status, right 11/23/2016  . Morbid obesity ([facility]) 11/23/2016  . OSA (obstructive sleep apnea) 11/23/2016  . Other hammer toe (acquired) 11/11/2013  . Exstrophy of bladder 11/11/2013    Past Surgical History:  Procedure  Laterality Date  . AMPUTATION TOE Left 05/25/2018   Procedure: AMPUTATION TOE left 3rd;  Surgeon: Toni ArthursHewitt, John, MD;  Location: [facility];  Service: Orthopedics;  Laterality: Left;  60min, to follow  . AMPUTATION TOE Left 06/28/2018   Procedure: Left foot revision 3rd toe amputation including 3rd metatarsal;  Surgeon: Toni ArthursHewitt, John, MD;  Location: [facility];  Service: Orthopedics;  Laterality: Left;  60min  . BICEPS TENDON REPAIR Left 2014   Partial  . COLONOSCOPY    . HERNIA REPAIR  2016   ventral  . HIP CLOSED REDUCTION Right 09/01/2018   Procedure: CLOSED REDUCTION HIP;  Surgeon: Yolonda Kidaogers, Jason Patrick, MD;  Location: [facility];  Service: Orthopedics;  Laterality: Right;  . JOINT REPLACEMENT     b/l knees   . TOE AMPUTATION Right 09/2013  . TOTAL HIP ARTHROPLASTY Right 08/16/2018   Procedure: TOTAL HIP ARTHROPLASTY ANTERIOR APPROACH;  Surgeon: Samson FredericSwinteck, Brian, MD;  Location: [facility];  Service: Orthopedics;  Laterality: Right;  . TOTAL HIP ARTHROPLASTY Left 11/02/2018   Procedure: TOTAL HIP ARTHROPLASTY ANTERIOR APPROACH;  Surgeon: Samson FredericSwinteck, Brian, MD;  Location: [facility];  Service: Orthopedics;  Laterality: Left;  . TOTAL KNEE ARTHROPLASTY     bilat        Home Medications    Prior to Admission medications   Medication Sig Start Date End Date Taking? Authorizing Provider  calcium carbonate (TUMS - DOSED IN MG ELEMENTAL CALCIUM) 500 MG chewable tablet Chew 2 tablets by mouth at bedtime as needed for indigestion or heartburn.    Yes [provider]  diphenhydrAMINE (BENADRYL ALLERGY) 25 MG tablet Take 25 mg by mouth at bedtime as needed for allergies.    Yes [provider]  docusate sodium (COLACE) 100 MG capsule Take 1 capsule (100 mg total) by mouth 2 (two) times daily. Patient taking differently: Take 100 mg by mouth daily.  11/03/18  Yes Swinteck, Arlys JohnBrian, MD  DULoxetine (CYMBALTA) 20 MG capsule Take 1 capsule (20 mg total) by mouth daily.  02/06/19  Yes Burns, Bobette MoStacy J, MD  furosemide (LASIX) 40 MG tablet Take 40 mg by mouth 2 (two) times daily.   Yes [provider]  levothyroxine (SYNTHROID) 50 MCG tablet Take 1 tablet (50 mcg total) by mouth daily. 04/23/19  Yes Burns, Bobette MoStacy J, MD  metoprolol tartrate (LOPRESSOR) 100 MG tablet TAKE 1 TABLET (100 MG TOTAL) BY MOUTH 2 (TWO) TIMES DAILY. 03/20/18  Yes Burns, Bobette MoStacy J, MD  Multiple Vitamin (MULTIVITAMIN WITH MINERALS) TABS tablet Take 1 tablet by mouth daily. 02/20/18  Yes Mikhail, Nita SellsMaryann, DO  potassium chloride SA (KLOR-CON M20) 20 MEQ tablet Take 2 tablets (40 mEq total) by mouth 2 (two) times daily. Patient taking differently: Take 40 mEq by mouth 3 (three) times daily.  03/13/19  Yes Burns, Bobette MoStacy J, MD  senna (SENOKOT) 8.6 MG TABS tablet Take 1  tablet (8.6 mg total) by mouth 2 (two) times daily. Patient taking differently: Take 1 tablet by mouth daily as needed for mild constipation.  08/17/18  Yes Swinteck, Arlys JohnBrian, MD  torsemide (DEMADEX) 20 MG tablet Take 1 tablet (20 mg total) by mouth 2 (two) times daily. 04/23/19  Yes Burns, Bobette MoStacy J, MD  aspirin 81 MG chewable tablet Chew 1 tablet (81 mg total) by mouth 2 (two) times daily. Patient not taking: Reported on 04/24/2019 11/03/18   Samson FredericSwinteck, Brian, MD    Family History Family History  Adopted: Yes  Family history unknown: Yes    Social History Social History   Tobacco Use  . Smoking status: Former Smoker    Packs/day: 0.25    Years: 10.00    Pack years: 2.50    Types: Cigarettes  . Smokeless tobacco: Never Used  . Tobacco comment: quit 2018  Substance Use Topics  . Alcohol use: Yes    Comment: 2-3 drinks 4-5 nights a week, liquor  . Drug use: Yes    Frequency: 1.0 times per week    Types: Marijuana    Comment: "4 times a month, maybe"     Allergies   Claritin [loratadine]   Review of Systems Review of Systems  Constitutional: Positive for unexpected weight change. Negative for appetite change, diaphoresis  and fever.  Respiratory: Positive for cough and shortness of breath.   Cardiovascular: Positive for leg swelling. Negative for chest pain.  Gastrointestinal: Positive for abdominal distention. Negative for abdominal pain, diarrhea, nausea and vomiting.  Genitourinary: Negative for dysuria.  Musculoskeletal: Negative for back pain.  Skin: Negative for rash.  Allergic/Immunologic: Negative for immunocompromised state.  Neurological: Negative for dizziness, syncope, weakness, light-headedness and headaches.  Psychiatric/Behavioral: Negative for agitation, confusion, hallucinations, self-injury and suicidal ideas.     Physical Exam Updated Vital Signs BP 129/76 (BP Location: Left Arm)   Pulse 93   Temp 98.1 F (36.7 C) (Oral)   Resp 20   Ht 6\' 1"  (1.854 m)   Wt (!) 162.6 kg   SpO2 96%   BMI 47.29 kg/m   Physical Exam Vitals signs and nursing note reviewed.  Constitutional:      Appearance: He is well-developed.  HENT:     Head: Normocephalic.  Eyes:     Conjunctiva/sclera: Conjunctivae normal.  Neck:     Musculoskeletal: Neck supple.  Cardiovascular:     Rate and Rhythm: Normal rate and regular rhythm.     Heart sounds: No murmur.  Pulmonary:     Effort: Pulmonary effort is normal.     Comments: Intermittent crackles in the bilateral bases.  Exam is somewhat limited secondary to body habitus.  No increased work of breathing. Abdominal:     General: There is distension.     Palpations: Abdomen is soft.     Comments: Abdomen is very distended and firm.  Positive fluid wave.  No focal tenderness throughout the abdomen.  Negative Murphy sign.  Normoactive bowel sounds.  Musculoskeletal:     Comments: 3+ pitting edema to the bilateral legs extending to the thighs  Skin:    General: Skin is warm and dry.  Neurological:     Mental Status: He is alert.     Comments: No tremor  Psychiatric:        Behavior: Behavior normal.      ED Treatments / Results  Labs (all labs  ordered are listed, but only abnormal results are displayed) Labs Reviewed  BASIC METABOLIC  PANEL - Abnormal; Notable for the following components:      Result Value   Potassium 2.7 (*)    Chloride 97 (*)    Glucose, Bld 124 (*)    BUN <5 (*)    Creatinine, Ser 0.57 (*)    Calcium 8.3 (*)    All other components within normal limits  CBC - Abnormal; Notable for the following components:   RBC 3.31 (*)    Hemoglobin 11.0 (*)    HCT 34.7 (*)    MCV 104.8 (*)    All other components within normal limits  BRAIN NATRIURETIC PEPTIDE - Abnormal; Notable for the following components:   B Natriuretic Peptide 165.3 (*)    All other components within normal limits  HEPATIC FUNCTION PANEL - Abnormal; Notable for the following components:   Albumin 2.5 (*)    AST 42 (*)    Alkaline Phosphatase 185 (*)    Total Bilirubin 2.9 (*)    Bilirubin, Direct 1.2 (*)    Indirect Bilirubin 1.7 (*)    All other components within normal limits  BASIC METABOLIC PANEL - Abnormal; Notable for the following components:   Potassium 2.8 (*)    CO2 21 (*)    Glucose, Bld 111 (*)    BUN <5 (*)    Calcium 8.0 (*)    Anion gap 17 (*)    All other components within normal limits  SARS CORONAVIRUS 2 (HOSPITAL ORDER, PERFORMED IN [facility])  MAGNESIUM  HIV ANTIBODY (ROUTINE TESTING W REFLEX)  CBG MONITORING, ED    EKG None  Radiology Dg Chest 2 View  Result Date: 04/24/2019 CLINICAL DATA:  Swelling of the legs in abdomen. History of congestive heart failure. EXAM: CHEST - 2 VIEW COMPARISON:  None. FINDINGS: Borderline cardiomegaly. Pulmonary venous hypertension without frank edema. No effusions. No focal bone lesion. Ordinary mild degenerative changes affect the spine. IMPRESSION: Borderline cardiomegaly. Pulmonary venous hypertension without frank edema or effusions. Electronically Signed   By: Paulina FusiMark  Shogry M.D.   On: 04/24/2019 17:29    Procedures Procedures (including critical care time)   Medications Ordered in ED Medications  sodium chloride flush (NS) 0.9 % injection 3 mL (3 mLs Intravenous Not Given 04/24/19 2154)  LORazepam (ATIVAN) injection 0-4 mg (0 mg Intravenous Hold 04/25/19 0648)    Or  LORazepam (ATIVAN) tablet 0-4 mg ( Oral See Alternative 04/25/19 0648)  LORazepam (ATIVAN) injection 0-4 mg (has no administration in time range)    Or  LORazepam (ATIVAN) tablet 0-4 mg (has no administration in time range)  thiamine (VITAMIN B-1) tablet 100 mg (has no administration in time range)    Or  thiamine (B-1) injection 100 mg (has no administration in time range)  metoprolol tartrate (LOPRESSOR) tablet 100 mg (has no administration in time range)  DULoxetine (CYMBALTA) DR capsule 20 mg (has no administration in time range)  levothyroxine (SYNTHROID) tablet 50 mcg (50 mcg Oral Given 04/25/19 0646)  calcium carbonate (TUMS - dosed in mg elemental calcium) chewable tablet 400 mg of elemental calcium (has no administration in time range)  docusate sodium (COLACE) capsule 100 mg (has no administration in time range)  senna (SENOKOT) tablet 8.6 mg (has no administration in time range)  multivitamin with minerals tablet 1 tablet (has no administration in time range)  diphenhydrAMINE (BENADRYL) capsule 25 mg (25 mg Oral Given 04/25/19 0354)  sodium chloride flush (NS) 0.9 % injection 3 mL (has no administration in time range)  sodium chloride flush (NS) 0.9 % injection 3 mL (has no administration in time range)  0.9 %  sodium chloride infusion (has no administration in time range)  ondansetron (ZOFRAN) injection 4 mg (has no administration in time range)  enoxaparin (LOVENOX) injection 40 mg (has no administration in time range)  aspirin EC tablet 81 mg (has no administration in time range)  hydrALAZINE (APRESOLINE) injection 5 mg (has no administration in time range)  lisinopril (ZESTRIL) tablet 5 mg (has no administration in time range)  potassium chloride SA (K-DUR) CR tablet 40  mEq (has no administration in time range)  furosemide (LASIX) injection 60 mg (has no administration in time range)  furosemide (LASIX) injection 40 mg (40 mg Intravenous Given 04/25/19 0024)  potassium chloride 10 mEq in 100 mL IVPB ( Intravenous Stopped 04/25/19 0529)  potassium chloride SA (K-DUR) CR tablet 40 mEq (40 mEq Oral Given 04/25/19 0038)  magnesium sulfate IVPB 2 g 50 mL ( Intravenous Stopped 04/25/19 0428)  potassium chloride SA (K-DUR) CR tablet 40 mEq (40 mEq Oral Given 04/25/19 0404)     Initial Impression / Assessment and Plan / ED Course  I have reviewed the triage vital signs and the nursing notes.  Pertinent labs & imaging results that were available during my care of the patient were reviewed by me and considered in my medical decision making (see chart for details).        58 year old male with a history of history of chronic diastolic heart failure, alcohol dependence, chronic bilateral lower extremity edema, morbid obesity, and bilateral hip pain s/p bilateral total hip arthroplasty presenting with worsening anasarca over the last 2 to 3 weeks accompanied by cough and shortness of breath.  No constitutional symptoms.  Patient has had a significant weight gain over the last 6 months.  He was seen by his PCP yesterday and his home diuretics dosing was increased without improvement in his symptoms.  The patient also reports alcohol dependence and drinks 1/2 gallon of liquor weekly.  No history of rehab or complicated withdrawal.  CIWA protocol has been ordered.  Patient was not tremulous on my exam.  He is not hypoxic and is without tachycardia.  He is otherwise hemodynamically stable.  Chest x-ray with borderline cardiomegaly and pulmonary venous hypertension without frank edema or effusions.  Labs are notable for potassium of 2.7.  IV and oral potassium have been ordered.  BNP is 165.  Hepatic function panel and COVID test are pending.  The patient was also given 40 mg of Lasix in  the ER.  Lower suspicion for his symptoms secondary to liver cirrhosis or failure.  Given failure with oral Lasix, the patient would benefit from admission for diuresis.  Spoke with Dr. Clyde Lundborg from the hospitalist team who will admit. The patient appears reasonably stabilized for admission considering the current resources, flow, and capabilities available in the ED at this time, and I doubt any other [facility] requiring further screening and/or treatment in the ED prior to admission.   Final Clinical Impressions(s) / ED Diagnoses   Final diagnoses:  Acute on chronic diastolic congestive heart failure (HCC)  Severe alcohol use disorder [facility])    ED Discharge Orders    None       Bobbyjo Marulanda A, PA-C 04/25/19 0910    Derwood Kaplan, MD 04/25/19 2352

## 2019-04-24 NOTE — Telephone Encounter (Signed)
LVM letting pt know.  

## 2019-04-25 ENCOUNTER — Encounter: Payer: Self-pay | Admitting: Internal Medicine

## 2019-04-25 ENCOUNTER — Inpatient Hospital Stay (HOSPITAL_COMMUNITY): Payer: BLUE CROSS/BLUE SHIELD

## 2019-04-25 DIAGNOSIS — I5033 Acute on chronic diastolic (congestive) heart failure: Secondary | ICD-10-CM

## 2019-04-25 DIAGNOSIS — E119 Type 2 diabetes mellitus without complications: Secondary | ICD-10-CM | POA: Diagnosis not present

## 2019-04-25 DIAGNOSIS — I10 Essential (primary) hypertension: Secondary | ICD-10-CM

## 2019-04-25 DIAGNOSIS — I272 Pulmonary hypertension, unspecified: Secondary | ICD-10-CM | POA: Diagnosis present

## 2019-04-25 DIAGNOSIS — E876 Hypokalemia: Secondary | ICD-10-CM

## 2019-04-25 DIAGNOSIS — F32A Depression, unspecified: Secondary | ICD-10-CM | POA: Diagnosis present

## 2019-04-25 DIAGNOSIS — I350 Nonrheumatic aortic (valve) stenosis: Secondary | ICD-10-CM | POA: Diagnosis present

## 2019-04-25 DIAGNOSIS — I890 Lymphedema, not elsewhere classified: Secondary | ICD-10-CM | POA: Diagnosis present

## 2019-04-25 DIAGNOSIS — R0609 Other forms of dyspnea: Secondary | ICD-10-CM | POA: Diagnosis not present

## 2019-04-25 DIAGNOSIS — K761 Chronic passive congestion of liver: Secondary | ICD-10-CM | POA: Diagnosis present

## 2019-04-25 DIAGNOSIS — E662 Morbid (severe) obesity with alveolar hypoventilation: Secondary | ICD-10-CM | POA: Diagnosis not present

## 2019-04-25 DIAGNOSIS — F1020 Alcohol dependence, uncomplicated: Secondary | ICD-10-CM | POA: Diagnosis present

## 2019-04-25 DIAGNOSIS — R0989 Other specified symptoms and signs involving the circulatory and respiratory systems: Secondary | ICD-10-CM | POA: Diagnosis not present

## 2019-04-25 DIAGNOSIS — K7030 Alcoholic cirrhosis of liver without ascites: Secondary | ICD-10-CM | POA: Diagnosis present

## 2019-04-25 DIAGNOSIS — Z96643 Presence of artificial hip joint, bilateral: Secondary | ICD-10-CM | POA: Diagnosis present

## 2019-04-25 DIAGNOSIS — E11621 Type 2 diabetes mellitus with foot ulcer: Secondary | ICD-10-CM | POA: Diagnosis present

## 2019-04-25 DIAGNOSIS — K219 Gastro-esophageal reflux disease without esophagitis: Secondary | ICD-10-CM | POA: Diagnosis present

## 2019-04-25 DIAGNOSIS — E785 Hyperlipidemia, unspecified: Secondary | ICD-10-CM | POA: Diagnosis present

## 2019-04-25 DIAGNOSIS — E039 Hypothyroidism, unspecified: Secondary | ICD-10-CM

## 2019-04-25 DIAGNOSIS — L97529 Non-pressure chronic ulcer of other part of left foot with unspecified severity: Secondary | ICD-10-CM | POA: Diagnosis present

## 2019-04-25 DIAGNOSIS — K7460 Unspecified cirrhosis of liver: Secondary | ICD-10-CM | POA: Diagnosis not present

## 2019-04-25 DIAGNOSIS — Z87891 Personal history of nicotine dependence: Secondary | ICD-10-CM | POA: Diagnosis not present

## 2019-04-25 DIAGNOSIS — F329 Major depressive disorder, single episode, unspecified: Secondary | ICD-10-CM | POA: Diagnosis present

## 2019-04-25 DIAGNOSIS — F1029 Alcohol dependence with unspecified alcohol-induced disorder: Secondary | ICD-10-CM

## 2019-04-25 DIAGNOSIS — T501X5A Adverse effect of loop [high-ceiling] diuretics, initial encounter: Secondary | ICD-10-CM | POA: Diagnosis present

## 2019-04-25 DIAGNOSIS — Z20828 Contact with and (suspected) exposure to other viral communicable diseases: Secondary | ICD-10-CM | POA: Diagnosis not present

## 2019-04-25 DIAGNOSIS — L0390 Cellulitis, unspecified: Secondary | ICD-10-CM | POA: Diagnosis not present

## 2019-04-25 DIAGNOSIS — Z96653 Presence of artificial knee joint, bilateral: Secondary | ICD-10-CM | POA: Diagnosis present

## 2019-04-25 DIAGNOSIS — E1142 Type 2 diabetes mellitus with diabetic polyneuropathy: Secondary | ICD-10-CM | POA: Diagnosis present

## 2019-04-25 DIAGNOSIS — E8770 Fluid overload, unspecified: Secondary | ICD-10-CM | POA: Diagnosis not present

## 2019-04-25 DIAGNOSIS — Z89422 Acquired absence of other left toe(s): Secondary | ICD-10-CM | POA: Diagnosis not present

## 2019-04-25 DIAGNOSIS — Z6841 Body Mass Index (BMI) 40.0 and over, adult: Secondary | ICD-10-CM | POA: Diagnosis not present

## 2019-04-25 DIAGNOSIS — R161 Splenomegaly, not elsewhere classified: Secondary | ICD-10-CM | POA: Diagnosis not present

## 2019-04-25 LAB — BASIC METABOLIC PANEL
Anion gap: 17 — ABNORMAL HIGH (ref 5–15)
BUN: <5 mg/dL — ABNORMAL LOW (ref 6–20)
CO2: 21 mmol/L — ABNORMAL LOW (ref 22–32)
Calcium: 8 mg/dL — ABNORMAL LOW (ref 8.9–10.3)
Chloride: 100 mmol/L (ref 98–111)
Creatinine, Ser: 0.63 mg/dL (ref 0.61–1.24)
GFR calc Af Amer: >60 mL/min (ref >60)
GFR calc non Af Amer: >60 mL/min (ref >60)
Glucose, Bld: 111 mg/dL — ABNORMAL HIGH (ref 70–99)
Potassium: 2.8 mmol/L — ABNORMAL LOW (ref 3.5–5.1)
Sodium: 138 mmol/L (ref 135–145)

## 2019-04-25 LAB — MAGNESIUM: Magnesium: 1.9 mg/dL (ref 1.7–2.4)

## 2019-04-25 LAB — HEPATIC FUNCTION PANEL
ALT: 16 U/L (ref 0–44)
AST: 42 U/L — ABNORMAL HIGH (ref 15–41)
Albumin: 2.5 g/dL — ABNORMAL LOW (ref 3.5–5.0)
Alkaline Phosphatase: 185 U/L — ABNORMAL HIGH (ref 38–126)
Bilirubin, Direct: 1.2 mg/dL — ABNORMAL HIGH (ref 0.0–0.2)
Indirect Bilirubin: 1.7 mg/dL — ABNORMAL HIGH (ref 0.3–0.9)
Total Bilirubin: 2.9 mg/dL — ABNORMAL HIGH (ref 0.3–1.2)
Total Protein: 7 g/dL (ref 6.5–8.1)

## 2019-04-25 LAB — ECHOCARDIOGRAM COMPLETE
Height: 73 in
Weight: 5735.49 oz

## 2019-04-25 LAB — SARS CORONAVIRUS 2 BY RT PCR (HOSPITAL ORDER, PERFORMED IN ~~LOC~~ HOSPITAL LAB): SARS Coronavirus 2: NEGATIVE

## 2019-04-25 LAB — HIV ANTIBODY (ROUTINE TESTING W REFLEX): HIV Screen 4th Generation wRfx: NONREACTIVE

## 2019-04-25 MED ORDER — ASPIRIN EC 81 MG PO TBEC
81.0000 mg | DELAYED_RELEASE_TABLET | Freq: Every day | ORAL | Status: DC
  Administered 2019-04-25 – 2019-04-27 (×3): 81 mg via ORAL
  Filled 2019-04-25 (×3): qty 1

## 2019-04-25 MED ORDER — SODIUM CHLORIDE 0.9 % IV SOLN: 250.0000 mL | INTRAVENOUS | Status: DC | PRN

## 2019-04-25 MED ORDER — DULOXETINE HCL 20 MG PO CPEP
20.0000 mg | ORAL_CAPSULE | Freq: Every day | ORAL | Status: DC
  Administered 2019-04-25 – 2019-04-27 (×3): 20 mg via ORAL
  Filled 2019-04-25 (×3): qty 1

## 2019-04-25 MED ORDER — SODIUM CHLORIDE 0.9% FLUSH: 3.0000 mL | INTRAVENOUS | Status: DC | PRN

## 2019-04-25 MED ORDER — ENOXAPARIN SODIUM 40 MG/0.4ML ~~LOC~~ SOLN
40.0000 mg | Freq: Every day | SUBCUTANEOUS | Status: DC
  Administered 2019-04-25: 40 mg via SUBCUTANEOUS
  Filled 2019-04-25 (×2): qty 0.4

## 2019-04-25 MED ORDER — FUROSEMIDE 10 MG/ML IJ SOLN
60.0000 mg | Freq: Three times a day (TID) | INTRAMUSCULAR | Status: DC
  Administered 2019-04-25 – 2019-04-27 (×6): 60 mg via INTRAVENOUS
  Filled 2019-04-25 (×6): qty 6

## 2019-04-25 MED ORDER — FUROSEMIDE 10 MG/ML IJ SOLN
60.0000 mg | Freq: Two times a day (BID) | INTRAMUSCULAR | Status: DC
  Administered 2019-04-25: 60 mg via INTRAVENOUS
  Filled 2019-04-25: qty 6

## 2019-04-25 MED ORDER — ONDANSETRON HCL 4 MG/2ML IJ SOLN: 4.0000 mg | Freq: Four times a day (QID) | INTRAMUSCULAR | Status: DC | PRN

## 2019-04-25 MED ORDER — POTASSIUM CHLORIDE CRYS ER 20 MEQ PO TBCR
40.0000 meq | EXTENDED_RELEASE_TABLET | Freq: Once | ORAL | Status: AC
  Administered 2019-04-25: 40 meq via ORAL
  Filled 2019-04-25: qty 2

## 2019-04-25 MED ORDER — LEVOTHYROXINE SODIUM 50 MCG PO TABS
50.0000 ug | ORAL_TABLET | Freq: Every day | ORAL | Status: DC
  Administered 2019-04-25 – 2019-04-27 (×3): 50 ug via ORAL
  Filled 2019-04-25 (×3): qty 1

## 2019-04-25 MED ORDER — DIPHENHYDRAMINE HCL 25 MG PO CAPS
25.0000 mg | ORAL_CAPSULE | Freq: Every evening | ORAL | Status: DC | PRN
  Administered 2019-04-25: 25 mg via ORAL
  Filled 2019-04-25 (×3): qty 1

## 2019-04-25 MED ORDER — MAGNESIUM SULFATE 2 GM/50ML IV SOLN
2.0000 g | Freq: Once | INTRAVENOUS | Status: AC
  Administered 2019-04-25: 2 g via INTRAVENOUS
  Filled 2019-04-25: qty 50

## 2019-04-25 MED ORDER — LISINOPRIL 5 MG PO TABS: 2.5000 mg | ORAL_TABLET | Freq: Every day | ORAL | Status: DC

## 2019-04-25 MED ORDER — ADULT MULTIVITAMIN W/MINERALS CH
1.0000 | ORAL_TABLET | Freq: Every day | ORAL | Status: DC
  Administered 2019-04-25 – 2019-04-27 (×3): 1 via ORAL
  Filled 2019-04-25 (×3): qty 1

## 2019-04-25 MED ORDER — LISINOPRIL 5 MG PO TABS
5.0000 mg | ORAL_TABLET | Freq: Every day | ORAL | Status: DC
  Administered 2019-04-25 – 2019-04-27 (×3): 5 mg via ORAL
  Filled 2019-04-25 (×3): qty 1

## 2019-04-25 MED ORDER — SODIUM CHLORIDE 0.9% FLUSH
3.0000 mL | Freq: Two times a day (BID) | INTRAVENOUS | Status: DC
  Administered 2019-04-25 – 2019-04-27 (×5): 3 mL via INTRAVENOUS

## 2019-04-25 MED ORDER — SENNA 8.6 MG PO TABS: 1.0000 | ORAL_TABLET | Freq: Every day | ORAL | Status: DC | PRN

## 2019-04-25 MED ORDER — DOCUSATE SODIUM 100 MG PO CAPS
100.0000 mg | ORAL_CAPSULE | Freq: Every day | ORAL | Status: DC
  Administered 2019-04-25 – 2019-04-27 (×3): 100 mg via ORAL
  Filled 2019-04-25 (×3): qty 1

## 2019-04-25 MED ORDER — HYDRALAZINE HCL 20 MG/ML IJ SOLN: 5.0000 mg | INTRAMUSCULAR | Status: DC | PRN

## 2019-04-25 MED ORDER — ENOXAPARIN SODIUM 80 MG/0.8ML ~~LOC~~ SOLN
80.0000 mg | Freq: Every day | SUBCUTANEOUS | Status: DC
  Administered 2019-04-26: 80 mg via SUBCUTANEOUS
  Filled 2019-04-25 (×2): qty 0.8

## 2019-04-25 MED ORDER — METOPROLOL TARTRATE 50 MG PO TABS
100.0000 mg | ORAL_TABLET | Freq: Two times a day (BID) | ORAL | Status: DC
  Administered 2019-04-25 – 2019-04-27 (×5): 100 mg via ORAL
  Filled 2019-04-25 (×5): qty 2

## 2019-04-25 MED ORDER — CALCIUM CARBONATE ANTACID 500 MG PO CHEW: 2.0000 | CHEWABLE_TABLET | Freq: Every evening | ORAL | Status: DC | PRN

## 2019-04-25 NOTE — Progress Notes (Signed)
Pt's O2 Sats 94% and above on RA while awake. However, when pt is asleep O2 Sats frequently drop to low 70s on RA. 3L/[place] applied when sleeping and O2 Sats continue to drop to upper 80s at times. MD Rizwan aware. Pt did not want to ambulate in hallway d/t SOB, but did ambulate a couple of circles in his room and stood for several minutes. O2 Sats did not go below 91% RA.

## 2019-04-25 NOTE — H&P (Signed)
History and Physical    [name] BienCraig [name] ZOX:[id number] DOB: 10/15/1961 DOA: 04/24/2019  Referring MD/NP/PA:   PCP: Pincus SanesBurns, Stacy J, MD   Patient coming from:  The patient is coming from home.  At baseline, pt is independent for most of ADL.        Chief Complaint: Shortness of breath, leg edema, weight gain  HPI: [name] is a 58 y.o. male with medical history significant of hypertension, hyperlipidemia, diet-controlled diabetes, dCHF, OSA not on CPAP, morbid obesity, alcohol abuse, GERD, hypothyroidism, depression, who presents with shortness of breath, leg edema and weight gain.  Patient states that he has gained more than 60 pounds in the past 3 months.  He has worsening leg edema and edema in the abdomen.  He developed shortness of breath.  He has mild dry cough, but no fever or chills.  No chest pain.  Patient denies nausea, vomiting, diarrhea, abdominal pain, symptoms of UTI or unilateral weakness.  Patient states that he was seen by his PCP yesterday, who increased dosage of his diuretics.  Currently patient is taking Lasix 40 mg twice daily and torsemide 20 mg twice daily, but no significant improvement.  ED Course: pt was found to have BNP 165, negative COVID-19 test, potassium 2.7, renal function normal, temperature normal, blood pressure 134/74, heart rate 91, oxygen saturation 94 to 98% on room air.  Chest x-ray showed cardiomegaly with pulmonary vascular hypertension.  Patient is admitted to telemetry bed as inpatient.  Review of Systems:   General: no fevers, chills, has body weight gain, has fatigue HEENT: no blurry vision, hearing changes or sore throat Respiratory: has dyspnea, coughing, no wheezing CV: no chest pain, no palpitations GI: no nausea, vomiting, abdominal pain, diarrhea, constipation GU: no dysuria, burning on urination, increased urinary frequency, hematuria  Ext: has leg edema Neuro: no unilateral weakness, numbness, or tingling, no vision change or hearing  loss Skin: no rash, no skin tear. MSK: No muscle spasm, no deformity, no limitation of range of movement in spin Heme: No easy bruising.  Travel history: No recent long distant travel.  Allergy:  Allergies  Allergen Reactions  . Claritin [Loratadine] Shortness Of Breath and Anxiety    Past Medical History:  Diagnosis Date  . Alcohol dependence (HCC)   . Arthritis    hips, hands  . Bilateral carpal tunnel syndrome   . Bilateral leg edema    Chronic  . Diabetes mellitus without complication (HCC)    type 2  . Diverticulitis    portion of colon removed  . DOE (dyspnea on exertion)    occ  . Elevated liver enzymes   . Fatty liver   . GERD (gastroesophageal reflux disease)    occ  . Hammer toe   . Hip pain   . History of ventral hernia repair 2016   x2  . Hyperlipidemia    pt unsure  . Hypertension   . Marijuana abuse   . Morbid obesity (HCC)   . Neuromuscular disorder (HCC)    peripheral neuropathy feet and few fingers  . OSA (obstructive sleep apnea)    has OSA-not used CPAP 2-3 yrs could not tolerate cpap  . PONV (postoperative nausea and vomiting)   . Toe ulcer (HCC)    left healed    Past Surgical History:  Procedure Laterality Date  . AMPUTATION TOE Left 05/25/2018   Procedure: AMPUTATION TOE left 3rd;  Surgeon: Toni ArthursHewitt, John, MD;  Location: [facility];  Service: Orthopedics;  Laterality: Left;  24min, to follow  . AMPUTATION TOE Left 06/28/2018   Procedure: Left foot revision 3rd toe amputation including 3rd metatarsal;  Surgeon: Wylene Simmer, MD;  Location: [place];  Service: Orthopedics;  Laterality: Left;  40min  . BICEPS TENDON REPAIR Left 2014   Partial  . COLONOSCOPY    . HERNIA REPAIR  2016   ventral  . HIP CLOSED REDUCTION Right 09/01/2018   Procedure: CLOSED REDUCTION HIP;  Surgeon: Nicholes Stairs, MD;  Location: [facility];  Service: Orthopedics;  Laterality: Right;  . JOINT REPLACEMENT     b/l knees   . TOE  AMPUTATION Right 09/2013  . TOTAL HIP ARTHROPLASTY Right 08/16/2018   Procedure: TOTAL HIP ARTHROPLASTY ANTERIOR APPROACH;  Surgeon: Rod Can, MD;  Location: [facility];  Service: Orthopedics;  Laterality: Right;  . TOTAL HIP ARTHROPLASTY Left 11/02/2018   Procedure: TOTAL HIP ARTHROPLASTY ANTERIOR APPROACH;  Surgeon: Rod Can, MD;  Location: [facility];  Service: Orthopedics;  Laterality: Left;  . TOTAL KNEE ARTHROPLASTY     bilat    Social History:  reports that he has quit smoking. His smoking use included cigarettes. He has a 2.50 pack-year smoking history. He has never used smokeless tobacco. He reports current alcohol use. He reports current drug use. Frequency: 1.00 time per week. Drug: Marijuana.  Family History:  Family History  Adopted: Yes  Family history unknown: Yes     Prior to Admission medications   Medication Sig Start Date End Date Taking? Authorizing Provider  calcium carbonate (TUMS - DOSED IN MG ELEMENTAL CALCIUM) 500 MG chewable tablet Chew 2 tablets by mouth at bedtime as needed for indigestion or heartburn.    Yes [provider]  diphenhydrAMINE (BENADRYL ALLERGY) 25 MG tablet Take 25 mg by mouth at bedtime as needed for allergies.    Yes [provider]  docusate sodium (COLACE) 100 MG capsule Take 1 capsule (100 mg total) by mouth 2 (two) times daily. Patient taking differently: Take 100 mg by mouth daily.  11/03/18  Yes Swinteck, Aaron Edelman, MD  DULoxetine (CYMBALTA) 20 MG capsule Take 1 capsule (20 mg total) by mouth daily. 02/06/19  Yes Burns, Claudina Lick, MD  furosemide (LASIX) 40 MG tablet Take 40 mg by mouth 2 (two) times daily.   Yes [provider]  levothyroxine (SYNTHROID) 50 MCG tablet Take 1 tablet (50 mcg total) by mouth daily. 04/23/19  Yes Burns, Claudina Lick, MD  metoprolol tartrate (LOPRESSOR) 100 MG tablet TAKE 1 TABLET (100 MG TOTAL) BY MOUTH 2 (TWO) TIMES DAILY. 03/20/18  Yes Burns, Claudina Lick, MD  Multiple Vitamin (MULTIVITAMIN WITH  MINERALS) TABS tablet Take 1 tablet by mouth daily. 02/20/18  Yes Mikhail, Velta Addison, DO  potassium chloride SA (KLOR-CON M20) 20 MEQ tablet Take 2 tablets (40 mEq total) by mouth 2 (two) times daily. Patient taking differently: Take 40 mEq by mouth 3 (three) times daily.  03/13/19  Yes Burns, Claudina Lick, MD  senna (SENOKOT) 8.6 MG TABS tablet Take 1 tablet (8.6 mg total) by mouth 2 (two) times daily. Patient taking differently: Take 1 tablet by mouth daily as needed for mild constipation.  08/17/18  Yes Swinteck, Aaron Edelman, MD  torsemide (DEMADEX) 20 MG tablet Take 1 tablet (20 mg total) by mouth 2 (two) times daily. 04/23/19  Yes Burns, Claudina Lick, MD  aspirin 81 MG chewable tablet Chew 1 tablet (81 mg total) by mouth 2 (two) times daily. Patient not taking: Reported on 04/24/2019  11/03/18   Samson FredericSwinteck, Brian, MD    Physical Exam: Vitals:   04/24/19 2230 04/24/19 2355 04/25/19 0000 04/25/19 0301  BP: 123/67 134/74 128/78 129/76  Pulse: 88 91 88 93  Resp: 18  (!) 26 20  Temp:    98.1 F (36.7 C)  TempSrc:    Oral  SpO2: 98%  99% 96%  Weight:    (!) 162.6 kg  Height:    6\' 1"  (1.854 m)   General: Not in acute distress. Mildly tremulous HEENT:       Eyes: PERRL, EOMI, no scleral icterus.       ENT: No discharge from the ears and nose, no pharynx injection, no tonsillar enlargement.        Neck: Difficult to assess JVD due to morbid obesity.  no bruit, no mass felt. Heme: No neck lymph node enlargement. Cardiac: S1/S2, RRR, No murmurs, No gallops or rubs. Respiratory: Good air movement bilaterally. No rales, wheezing, rhonchi or rubs. GI: Soft, nondistended, nontender, no rebound pain, no organomegaly, BS present. GU: No hematuria Ext: 3+ pitting leg edema bilaterally.  Has chronic venous insufficiency change in both legs.  2+DP/PT pulse bilaterally. Musculoskeletal: No joint deformities, No joint redness or warmth, no limitation of ROM in spin. Skin: No rashes.  Neuro: Alert, oriented X3, cranial nerves  II-XII grossly intact, moves all extremities normally.  Psych: Patient is not psychotic, no suicidal or hemocidal ideation.  Labs on Admission: I have personally reviewed following labs and imaging studies  CBC: Recent Labs  Lab 04/23/19 1048 04/24/19 2009  WBC 10.5 9.0  NEUTROABS 7.5  --   HGB 11.4* 11.0*  HCT 33.8* 34.7*  MCV 101.7* 104.8*  PLT 277.0 270   Basic Metabolic Panel: Recent Labs  Lab 04/23/19 1048 04/24/19 2009 04/25/19 0454  NA 136 135  --   K 2.8* 2.7*  --   CL 96 97*  --   CO2 25 26  --   GLUCOSE 121* 124*  --   BUN 6 <5*  --   CREATININE 0.71 0.57*  --   CALCIUM 8.5 8.3*  --   MG  --   --  1.9   GFR: Estimated Creatinine Clearance: 162.8 mL/min (A) (by C-G formula based on SCr of 0.57 mg/dL (L)). Liver Function Tests: Recent Labs  Lab 04/23/19 1048 04/25/19 0454  AST 38* 42*  ALT 13 16  ALKPHOS 226* 185*  BILITOT 3.1* 2.9*  PROT 7.5 7.0  ALBUMIN 3.0* 2.5*   No results for input(s): LIPASE, AMYLASE in the last 168 hours. No results for input(s): AMMONIA in the last 168 hours. Coagulation Profile: No results for input(s): INR, PROTIME in the last 168 hours. Cardiac Enzymes: No results for input(s): CKTOTAL, CKMB, CKMBINDEX, TROPONINI in the last 168 hours. BNP (last 3 results) Recent Labs    04/23/19 1048  PROBNP 505.0*   HbA1C: Recent Labs    04/23/19 1048  HGBA1C 5.0   CBG: No results for input(s): GLUCAP in the last 168 hours. Lipid Profile: No results for input(s): CHOL, HDL, LDLCALC, TRIG, CHOLHDL, LDLDIRECT in the last 72 hours. Thyroid Function Tests: Recent Labs    04/23/19 1048  TSH 6.49*   Anemia Panel: No results for input(s): VITAMINB12, FOLATE, FERRITIN, TIBC, IRON, RETICCTPCT in the last 72 hours. Urine analysis: No results found for: COLORURINE, APPEARANCEUR, LABSPEC, PHURINE, GLUCOSEU, HGBUR, BILIRUBINUR, KETONESUR, PROTEINUR, UROBILINOGEN, NITRITE, LEUKOCYTESUR Sepsis Labs:  @LABRCNTIP (procalcitonin:4,lacticidven:4) ) Recent Results (from the past 240 hour(s))  SARS Coronavirus 2 (CEPHEID - Performed in [facility] hospital lab), Hosp Order     Status: None   Collection Time: 04/25/19  1:18 AM   Specimen: Nasopharyngeal Swab  Result Value Ref Range Status   SARS Coronavirus 2 NEGATIVE NEGATIVE Final    Comment: (NOTE) If result is NEGATIVE SARS-CoV-2 target nucleic acids are NOT DETECTED. The SARS-CoV-2 RNA is generally detectable in upper and lower  respiratory specimens during the acute phase of infection. The lowest  concentration of SARS-CoV-2 viral copies this assay can detect is 250  copies / mL. A negative result does not preclude SARS-CoV-2 infection  and should not be used as the sole basis for treatment or other  patient management decisions.  A negative result may occur with  improper specimen collection / handling, submission of specimen other  than nasopharyngeal swab, presence of viral mutation(s) within the  areas targeted by this assay, and inadequate number of viral copies  (<250 copies / mL). A negative result must be combined with clinical  observations, patient history, and epidemiological information. If result is POSITIVE SARS-CoV-2 target nucleic acids are DETECTED. The SARS-CoV-2 RNA is generally detectable in upper and lower  respiratory specimens dur ing the acute phase of infection.  Positive  results are indicative of active infection with SARS-CoV-2.  Clinical  correlation with patient history and other diagnostic information is  necessary to determine patient infection status.  Positive results do  not rule out bacterial infection or co-infection with other viruses. If result is PRESUMPTIVE POSTIVE SARS-CoV-2 nucleic acids MAY BE PRESENT.   A presumptive positive result was obtained on the submitted specimen  and confirmed on repeat testing.  While 2019 novel coronavirus  (SARS-CoV-2) nucleic acids may be present in the  submitted sample  additional confirmatory testing may be necessary for epidemiological  and / or clinical management purposes  to differentiate between  SARS-CoV-2 and other Sarbecovirus currently known to infect humans.  If clinically indicated additional testing with an alternate test  methodology [phone]) is advised. The SARS-CoV-2 RNA is generally  detectable in upper and lower respiratory sp ecimens during the acute  phase of infection. The expected result is Negative. Fact Sheet for Patients:  BoilerBrush.com.cy Fact Sheet for Healthcare Providers: https://pope.com/ This test is not yet approved or cleared by the [place] FDA and has been authorized for detection and/or diagnosis of SARS-CoV-2 by FDA under an Emergency Use Authorization (EUA).  This EUA will remain in effect (meaning this test can be used) for the duration of the COVID-19 declaration under Section 564(b)(1) of the Act, 21 U.S.C. section 360bbb-3(b)(1), unless the authorization is terminated or revoked sooner. Performed at [facility], [address] [place], [place] [address]      Radiological Exams on Admission: Dg Chest 2 View  Result Date: 04/24/2019 CLINICAL DATA:  Swelling of the legs in abdomen. History of congestive heart failure. EXAM: CHEST - 2 VIEW COMPARISON:  None. FINDINGS: Borderline cardiomegaly. Pulmonary venous hypertension without frank edema. No effusions. No focal bone lesion. Ordinary mild degenerative changes affect the spine. IMPRESSION: Borderline cardiomegaly. Pulmonary venous hypertension without frank edema or effusions. Electronically Signed   By: Paulina Fusi M.D.   On: 04/24/2019 17:29     EKG:  Not done in ED, will get one.   Assessment/Plan Principal Problem:   Acute on chronic diastolic CHF (congestive heart failure) (HCC) Active Problems:   Hypertension   Alcohol dependence syndrome (HCC)    Hypothyroidism  Depression   Diabetes mellitus without complication (HCC)   Acute on chronic diastolic CHF (congestive heart failure) [facility](HCC): Patient has elevated BNP 163, 3+ leg edema, shortness of breath, chest x-ray showed cardiomegaly with pulmonary venous hypertension, consistent with CHF exacerbation.  -will admit to tele bed as inpt. -Lasix 60 mg bid by IV (pt received 40 mg of IV lasix in ED) -2d echo -Daily weights -strict I/O's -Low salt diet -Fluid restriction -start lisinopril 5 mg daily  HTN:  -lisinopril, metoprolol -Patient is on IV Lasix -IV hydralazine prn  Alcohol dependence syndrome (HCC): -CiWA protocol  Hypothyroidism: -Synthroid  Depression:  -Cymbalta,  Diet-controlled diabetes wihout complications: A1c 5.0 on 04/23/2019, well controlled. -Check CBG qAM   Inpatient status:  # Patient requires inpatient status due to high intensity of service, high risk for further deterioration and high frequency of surveillance required.  I certify that at the point of admission it is my clinical judgment that the patient will require inpatient hospital care spanning beyond 2 midnights from the point of admission.  . This patient has multiple chronic comorbidities including hypertension, hyperlipidemia, diet-controlled diabetes, dCHF, OSA not on CPAP, morbid obesity, alcohol abuse, GERD, hypothyroidism, depression, who presents with shortness of breath, leg edema and weight gain.Now patient has presenting with CHF exacerbation with more than 60 pounds weight gain. . The worrisome physical exam findings include 3+ leg edema . The initial radiographic and laboratory data are worrisome because of elevated BNP, pulmonary venous hypertension . Current medical needs: please see my assessment and plan . Predictability of an adverse outcome (risk): Patient has multiple comorbidities, now presents with CHF exacerbation.  Patient has more than 60 pounds of weight gain recently.   Will need at least 2 days to diurese patient.  Patient will need to be treated in hospital for at least 2 days.     DVT ppx: SQ Lovenox Code Status: Full code Family Communication: None at bed side.   Disposition Plan:  Anticipate discharge back to previous home environment Consults called:  none Admission status:  Inpatient/tele         Date of Service 04/25/2019    Lorretta HarpXilin Tomislav Micale [facility]   If 7PM-7AM, please contact night-coverage [email or web address] Password [email or web address] 04/25/2019, 7:06 AM

## 2019-04-25 NOTE — Plan of Care (Signed)
Plan of care discussed.   

## 2019-04-25 NOTE — Progress Notes (Signed)
PROGRESS NOTE    [name] BienCraig Loor   ZOX:[id number]  DOB: 10/01/1961  DOA: 04/24/2019 PCP: Pincus SanesBurns, Stacy J, MD   Brief Narrative:  [name]  is a 58 y.o. male with medical history significant of hypertension, hyperlipidemia, diet-controlled diabetes, dCHF, OSA not on CPAP, morbid obesity, alcohol abuse, GERD, hypothyroidism, depression, who presents with shortness of breath, leg edema and weight gain.  Patient states that he has gained more than 60 pounds in the past 3 months.  He has worsening leg edema and edema in the abdomen.  He developed shortness of breath.  He has been taking Lasix 40 mg BID but tells me he drinks a lot of water every day.    Subjective: No complaints today. Feels about the same as yesterday.     Assessment & Plan:   Principal Problem:   Acute on chronic diastolic CHF (congestive heart failure) - increase lasix to 60 mg TID - f/u on ECHO - he claims he has gained 60 lb in the past few months  Active Problems: Hypokalemia - K 2.8 today and was the same 2 days ago - due to Lasix - cont to replace aggressively - Mg is 1.9  Elevated LFTs - ? Passive hepatic congestion in addition to alcohol abuse - follow while   Severe sleep apnea - he sent back his CPAP machine a couple of years ago as he could not tolerated - pulse ox drops into the 70s when he sleeps    Hypertension - Lisinopril, Lasix, Metoprolol    Alcohol dependence  - CIWA scale and ativan ordered for possible withdrawal    Hypothyroidism - cont Synthroid    Depression - Cymbalta    Diabetes mellitus without complication   - patient states he has been taken off of medications by his PCP as his sugars have been controlled  Neuropathy - states he barely has feeling in his feet  Time spent in minutes:  35 DVT prophylaxis:  Lovenox Code Status: Full code Family Communication:  Disposition Plan: continue diuresis Consultants:   none Procedures:   ECHO report pending  Antimicrobials:  Anti-infectives (From admission, onward)   None       Objective: Vitals:   04/24/19 2355 04/25/19 0000 04/25/19 0301 04/25/19 1044  BP: 134/74 128/78 129/76 119/75  Pulse: 91 88 93 96  Resp:  (!) 26 20 18   Temp:   98.1 F (36.7 C) 97.8 F (36.6 C)  TempSrc:   Oral Oral  SpO2:  99% 96% 94%  Weight:   (!) 162.6 kg   Height:   6\' 1"  (1.854 m)     Intake/Output Summary (Last 24 hours) at 04/25/2019 1312 Last data filed at 04/25/2019 1035 Gross per 24 hour  Intake 1221.13 ml  Output 1100 ml  Net 121.13 ml   Filed Weights   04/24/19 1530 04/25/19 0301  Weight: (!) 136.6 kg (!) 162.6 kg    Examination: General exam: Appears comfortable  HEENT: PERRLA, oral mucosa moist, no sclera icterus or thrush Respiratory system: Clear to auscultation. Respiratory effort normal. Cardiovascular system: S1 & S2 heard, RRR.   Gastrointestinal system: Abdomen soft, obese, non-tender, nondistended. Normal bowel sounds. Central nervous system: Alert and oriented. No focal neurological deficits. Extremities: No cyanosis, clubbing - has severe pedal edema Skin: No rashes or ulcers Psychiatry:  Mood & affect appropriate.     Data Reviewed: I have personally reviewed following labs and imaging studies  CBC: Recent Labs  Lab 04/23/19 1048 04/24/19 2009  WBC 10.5 9.0  NEUTROABS 7.5  --   HGB 11.4* 11.0*  HCT 33.8* 34.7*  MCV 101.7* 104.8*  PLT 277.0 270   Basic Metabolic Panel: Recent Labs  Lab 04/23/19 1048 04/24/19 2009 04/25/19 0454  NA 136 135 138  K 2.8* 2.7* 2.8*  CL 96 97* 100  CO2 25 26 21*  GLUCOSE 121* 124* 111*  BUN 6 <5* <5*  CREATININE 0.71 0.57* 0.63  CALCIUM 8.5 8.3* 8.0*  MG  --   --  1.9   GFR: Estimated Creatinine Clearance: 162.8 mL/min (by C-G formula based on SCr of 0.63 mg/dL). Liver Function Tests: Recent Labs  Lab 04/23/19 1048 04/25/19 0454  AST 38* 42*  ALT 13 16  ALKPHOS 226* 185*  BILITOT 3.1* 2.9*  PROT 7.5 7.0   ALBUMIN 3.0* 2.5*   No results for input(s): LIPASE, AMYLASE in the last 168 hours. No results for input(s): AMMONIA in the last 168 hours. Coagulation Profile: No results for input(s): INR, PROTIME in the last 168 hours. Cardiac Enzymes: No results for input(s): CKTOTAL, CKMB, CKMBINDEX, TROPONINI in the last 168 hours. BNP (last 3 results) Recent Labs    04/23/19 1048  PROBNP 505.0*   HbA1C: Recent Labs    04/23/19 1048  HGBA1C 5.0   CBG: No results for input(s): GLUCAP in the last 168 hours. Lipid Profile: No results for input(s): CHOL, HDL, LDLCALC, TRIG, CHOLHDL, LDLDIRECT in the last 72 hours. Thyroid Function Tests: Recent Labs    04/23/19 1048  TSH 6.49*   Anemia Panel: No results for input(s): VITAMINB12, FOLATE, FERRITIN, TIBC, IRON, RETICCTPCT in the last 72 hours. Urine analysis: No results found for: COLORURINE, APPEARANCEUR, LABSPEC, PHURINE, GLUCOSEU, HGBUR, BILIRUBINUR, KETONESUR, PROTEINUR, UROBILINOGEN, NITRITE, LEUKOCYTESUR Sepsis Labs: @LABRCNTIP (procalcitonin:4,lacticidven:4) ) Recent Results (from the past 240 hour(s))  SARS Coronavirus 2 (CEPHEID - Performed in [facility] hospital lab), Hosp Order     Status: None   Collection Time: 04/25/19  1:18 AM   Specimen: Nasopharyngeal Swab  Result Value Ref Range Status   SARS Coronavirus 2 NEGATIVE NEGATIVE Final    Comment: (NOTE) If result is NEGATIVE SARS-CoV-2 target nucleic acids are NOT DETECTED. The SARS-CoV-2 RNA is generally detectable in upper and lower  respiratory specimens during the acute phase of infection. The lowest  concentration of SARS-CoV-2 viral copies this assay can detect is 250  copies / mL. A negative result does not preclude SARS-CoV-2 infection  and should not be used as the sole basis for treatment or other  patient management decisions.  A negative result may occur with  improper specimen collection / handling, submission of specimen other  than nasopharyngeal swab,  presence of viral mutation(s) within the  areas targeted by this assay, and inadequate number of viral copies  (<250 copies / mL). A negative result must be combined with clinical  observations, patient history, and epidemiological information. If result is POSITIVE SARS-CoV-2 target nucleic acids are DETECTED. The SARS-CoV-2 RNA is generally detectable in upper and lower  respiratory specimens dur ing the acute phase of infection.  Positive  results are indicative of active infection with SARS-CoV-2.  Clinical  correlation with patient history and other diagnostic information is  necessary to determine patient infection status.  Positive results do  not rule out bacterial infection or co-infection with other viruses. If result is PRESUMPTIVE POSTIVE SARS-CoV-2 nucleic acids MAY BE PRESENT.   A presumptive positive result was obtained on the submitted specimen  and confirmed on repeat  testing.  While 2019 novel coronavirus  (SARS-CoV-2) nucleic acids may be present in the submitted sample  additional confirmatory testing may be necessary for epidemiological  and / or clinical management purposes  to differentiate between  SARS-CoV-2 and other Sarbecovirus currently known to infect humans.  If clinically indicated additional testing with an alternate test  methodology [phone]) is advised. The SARS-CoV-2 RNA is generally  detectable in upper and lower respiratory sp ecimens during the acute  phase of infection. The expected result is Negative. Fact Sheet for Patients:  StrictlyIdeas.no Fact Sheet for Healthcare Providers: BankingDealers.co.za This test is not yet approved or cleared by the [place] FDA and has been authorized for detection and/or diagnosis of SARS-CoV-2 by FDA under an Emergency Use Authorization (EUA).  This EUA will remain in effect (meaning this test can be used) for the duration of the COVID-19 declaration under  Section 564(b)(1) of the Act, 21 U.S.C. section 360bbb-3(b)(1), unless the authorization is terminated or revoked sooner. Performed at [facility], [address], [place], [place] [address]          Radiology Studies: Dg Chest 2 View  Result Date: 04/24/2019 CLINICAL DATA:  Swelling of the legs in abdomen. History of congestive heart failure. EXAM: CHEST - 2 VIEW COMPARISON:  None. FINDINGS: Borderline cardiomegaly. Pulmonary venous hypertension without frank edema. No effusions. No focal bone lesion. Ordinary mild degenerative changes affect the spine. IMPRESSION: Borderline cardiomegaly. Pulmonary venous hypertension without frank edema or effusions. Electronically Signed   By: Nelson Chimes M.D.   On: 04/24/2019 17:29      Scheduled Meds: . aspirin EC  81 mg Oral Daily  . docusate sodium  100 mg Oral Daily  . DULoxetine  20 mg Oral Daily  . enoxaparin (LOVENOX) injection  40 mg Subcutaneous Daily  . furosemide  60 mg Intravenous Q8H  . levothyroxine  50 mcg Oral Daily  . lisinopril  5 mg Oral Daily  . LORazepam  0-4 mg Intravenous Q6H   Or  . LORazepam  0-4 mg Oral Q6H  . [START ON 04/27/2019] LORazepam  0-4 mg Intravenous Q12H   Or  . [START ON 04/27/2019] LORazepam  0-4 mg Oral Q12H  . metoprolol tartrate  100 mg Oral BID  . multivitamin with minerals  1 tablet Oral Daily  . sodium chloride flush  3 mL Intravenous Once  . sodium chloride flush  3 mL Intravenous Q12H  . thiamine  100 mg Oral Daily   Or  . thiamine  100 mg Intravenous Daily   Continuous Infusions: . sodium chloride       LOS: 0 days      Debbe Odea, MD [facility] Pager: [email or web address] Password [email or web address] 04/25/2019, 1:12 PM

## 2019-04-25 NOTE — Progress Notes (Signed)
Assumed care from previous RN. Agree with earlier assessment. Continue to monitor. Jesi Jurgens A Skylah Delauter, RN 

## 2019-04-25 NOTE — Progress Notes (Signed)
  Echocardiogram 2D Echocardiogram has been performed.  [name] 04/25/2019, 9:21 AM

## 2019-04-26 ENCOUNTER — Inpatient Hospital Stay (HOSPITAL_COMMUNITY): Payer: BLUE CROSS/BLUE SHIELD

## 2019-04-26 DIAGNOSIS — L0390 Cellulitis, unspecified: Secondary | ICD-10-CM

## 2019-04-26 DIAGNOSIS — R0989 Other specified symptoms and signs involving the circulatory and respiratory systems: Secondary | ICD-10-CM

## 2019-04-26 LAB — BASIC METABOLIC PANEL
Anion gap: 12 (ref 5–15)
BUN: <5 mg/dL — ABNORMAL LOW (ref 6–20)
CO2: 26 mmol/L (ref 22–32)
Calcium: 7.9 mg/dL — ABNORMAL LOW (ref 8.9–10.3)
Chloride: 99 mmol/L (ref 98–111)
Creatinine, Ser: 0.66 mg/dL (ref 0.61–1.24)
GFR calc Af Amer: >60 mL/min (ref >60)
GFR calc non Af Amer: >60 mL/min (ref >60)
Glucose, Bld: 107 mg/dL — ABNORMAL HIGH (ref 70–99)
Potassium: 2.7 mmol/L — CL (ref 3.5–5.1)
Sodium: 137 mmol/L (ref 135–145)

## 2019-04-26 LAB — VITAMIN B12: Vitamin B-12: 442 pg/mL (ref 180–914)

## 2019-04-26 LAB — MAGNESIUM: Magnesium: 1.8 mg/dL (ref 1.7–2.4)

## 2019-04-26 MED ORDER — POTASSIUM CHLORIDE 10 MEQ/100ML IV SOLN
10.0000 meq | INTRAVENOUS | Status: AC
  Administered 2019-04-26 (×4): 10 meq via INTRAVENOUS
  Filled 2019-04-26 (×4): qty 100

## 2019-04-26 MED ORDER — POTASSIUM CHLORIDE CRYS ER 20 MEQ PO TBCR
40.0000 meq | EXTENDED_RELEASE_TABLET | ORAL | Status: AC
  Administered 2019-04-26 (×2): 40 meq via ORAL
  Filled 2019-04-26 (×2): qty 2

## 2019-04-26 NOTE — Progress Notes (Deleted)
CRITICAL VALUE ALERT  Critical Value:  K+ 2.7  Date & Time Notied:  04/26/19 @ 0520  Provider Notified: Tylene Fantasia  Orders Received/Actions taken: Awaiting further orders.

## 2019-04-26 NOTE — Progress Notes (Signed)
CRITICAL VALUE ALERT  Critical Value:  K 2.7  Date & Time Notied:  04/26/19 @ 0530  Provider Notified: Tylene Fantasia  Orders Received/Actions taken: Awaiting further orders.

## 2019-04-26 NOTE — Consult Note (Signed)
[place] Nurse wound consult note Reason for Consult: Edema to bilateral lower legs.  Wears ace bandages and Darco shoes on both feet for diabetic polyneuropathy.  Is sleepy and minimally participative today.  Wound type: Edema/CHF exacerbation Pressure Injury POA: NA Measurement: Left lateral foot near heel with 2 cm ruptured blister with peeling epithelium.  LInear abrasion to right anterior lower leg from wraps. Wound OFH:QRFXJ red Drainage (amount, consistency, odor) none noted Periwound:generalized edema and chronic skin changes.  Dressing procedure/placement/frequency: Cleanse legs with soap and water and pat dry. Ortho tech to apply [employer] to both legs and change weekly on Thursday.  Bedside RN to remove prior to discharge home and patient will resume Ace wraps.  Will not follow at this time.  Please re-consult if needed.  Domenic Moras MSN, RN, FNP-BC CWON Wound, Ostomy, Continence Nurse Pager [phone]

## 2019-04-26 NOTE — Plan of Care (Signed)
  Problem: Education: Goal: Knowledge of General Education information will improve Description: Including pain rating scale, medication(s)/side effects and non-pharmacologic comfort measures Outcome: Progressing   Problem: Cardiac: Goal: Ability to achieve and maintain adequate cardiopulmonary perfusion will improve Outcome: Progressing   Problem: Health Behavior/Discharge Planning: Goal: Ability to manage health-related needs will improve Outcome: Progressing   Problem: Clinical Measurements: Goal: Will remain free from infection Outcome: Progressing

## 2019-04-26 NOTE — Progress Notes (Signed)
PROGRESS NOTE    [name]   ZOX:[id number]  DOB: 11/19/1960  DOA: 04/24/2019 PCP: Pincus SanesBurns, Stacy J, MD   Brief Narrative:  [name]  is a 58 y.o. male with medical history significant of hypertension, hyperlipidemia, diet-controlled diabetes, dCHF, OSA not on CPAP, morbid obesity, alcohol abuse, GERD, hypothyroidism, depression, who presents with shortness of breath, leg edema and weight gain.  Patient states that he has gained more than 60 pounds in the past 3 months.  He has worsening leg edema and edema in the abdomen.  He developed shortness of breath.  He has been taking Lasix 40 mg BID but tells me he drinks a lot of water every day.    Subjective: No complaints today.     Assessment & Plan:   Principal Problem:   Acute on chronic diastolic CHF (congestive heart failure) - he claims he has gained 60 lb in the past few months - increased lasix to 60 mg TID- continue to diurese- he insists his abdomen is filled with fluid as well- may have cirrhosis from life long drinking- will check an abdominal ultrasound -   ECHO reviewed- no significant change from previous- see full report below   Active Problems: Ulcer on left foot - wound care to evaluate- poor pedal pulse- will get ABI to check blood flow- he has had 2 prior amputations due to nonhealing wounds  Hypokalemia - K 2.7 today   - due to Lasix - cont to replace aggressively - Mg is 1.8 today  Elevated LFTs - ? Passive hepatic congestion in addition to alcohol abuse - follow    Severe sleep apnea - he sent back his CPAP machine a couple of years ago as he could not tolerated - I have had a long discussion with him today about having another sleep study done - pulse ox drops into the 70s when he sleeps- will check nocturnal pulse ox tonight to see if O2 via nasal cannula is sufficient for now    Hypertension - Lisinopril, Lasix, Metoprolol    Alcohol dependence  - 1/2 gallon of vodka in 10 days now but in  the past 1/2 gallon every 4 days - CIWA scale and ativan ordered for possible withdrawal- he admits to being very anxious today    Hypothyroidism - cont Synthroid    Depression - Cymbalta    Diabetes mellitus without complication   - patient states he has been taken off of medications by his PCP as his sugars have been controlled  Neuropathy - states he barely has feeling in his feet  Time spent in minutes:  35 DVT prophylaxis:  Lovenox Code Status: Full code Family Communication:  Disposition Plan: continue diuresis Consultants:   none Procedures:   ECHO  1. The left ventricle has normal systolic function with an ejection fraction of 60-65%. The cavity size was mildly dilated. Left ventricular diastolic parameters were normal. No evidence of left ventricular regional wall motion abnormalities.  2. The right ventricle has normal systolic function. The cavity was normal. There is no increase in right ventricular wall thickness.  3. Left atrial size was not well visualized.  4. The aortic valve is tricuspid. Moderate sclerosis of the aortic valve. Antimicrobials:  Anti-infectives (From admission, onward)   None       Objective: Vitals:   04/25/19 1800 04/25/19 2055 04/26/19 0334 04/26/19 0402  BP: 132/90 115/68  (!) 104/54  Pulse: 90 71  63  Resp: 18 16  18  Temp: 98.9 F (37.2 C) 98.4 F (36.9 C)  97.6 F (36.4 C)  TempSrc: Axillary Oral  Oral  SpO2: 96% 94%  94%  Weight:   (!) 158.7 kg   Height:        Intake/Output Summary (Last 24 hours) at 04/26/2019 0102 Last data filed at 04/26/2019 0846 Gross per 24 hour  Intake 653 ml  Output 2800 ml  Net -2147 ml   Filed Weights   04/24/19 1530 04/25/19 0301 04/26/19 0334  Weight: (!) 136.6 kg (!) 162.6 kg (!) 158.7 kg    Examination: General exam: Appears comfortable  HEENT: PERRLA, oral mucosa moist, no sclera icterus or thrush Respiratory system: Clear to auscultation. Respiratory effort normal.  Cardiovascular system: S1 & S2 heard,  No murmurs  Gastrointestinal system: Abdomen soft, non-tender, nondistended. Normal bowel sounds   Central nervous system: Alert and oriented. No focal neurological deficits. Extremities: No cyanosis, clubbing or edema- edema is improving Skin: No rashes - ulcer on left heel draining clear fluid, foul smelling Psychiatry:  Mood & affect appropriate.     Data Reviewed: I have personally reviewed following labs and imaging studies  CBC: Recent Labs  Lab 04/23/19 1048 04/24/19 2009  WBC 10.5 9.0  NEUTROABS 7.5  --   HGB 11.4* 11.0*  HCT 33.8* 34.7*  MCV 101.7* 104.8*  PLT 277.0 725   Basic Metabolic Panel: Recent Labs  Lab 04/23/19 1048 04/24/19 2009 04/25/19 0454 04/26/19 0445  NA 136 135 138 137  K 2.8* 2.7* 2.8* 2.7*  CL 96 97* 100 99  CO2 25 26 21* 26  GLUCOSE 121* 124* 111* 107*  BUN 6 <5* <5* <5*  CREATININE 0.71 0.57* 0.63 0.66  CALCIUM 8.5 8.3* 8.0* 7.9*  MG  --   --  1.9 1.8   GFR: Estimated Creatinine Clearance: 160.5 mL/min (by C-G formula based on SCr of 0.66 mg/dL). Liver Function Tests: Recent Labs  Lab 04/23/19 1048 04/25/19 0454  AST 38* 42*  ALT 13 16  ALKPHOS 226* 185*  BILITOT 3.1* 2.9*  PROT 7.5 7.0  ALBUMIN 3.0* 2.5*   No results for input(s): LIPASE, AMYLASE in the last 168 hours. No results for input(s): AMMONIA in the last 168 hours. Coagulation Profile: No results for input(s): INR, PROTIME in the last 168 hours. Cardiac Enzymes: No results for input(s): CKTOTAL, CKMB, CKMBINDEX, TROPONINI in the last 168 hours. BNP (last 3 results) Recent Labs    04/23/19 1048  PROBNP 505.0*   HbA1C: Recent Labs    04/23/19 1048  HGBA1C 5.0   CBG: No results for input(s): GLUCAP in the last 168 hours. Lipid Profile: No results for input(s): CHOL, HDL, LDLCALC, TRIG, CHOLHDL, LDLDIRECT in the last 72 hours. Thyroid Function Tests: Recent Labs    04/23/19 1048  TSH 6.49*   Anemia Panel: No  results for input(s): VITAMINB12, FOLATE, FERRITIN, TIBC, IRON, RETICCTPCT in the last 72 hours. Urine analysis: No results found for: COLORURINE, APPEARANCEUR, LABSPEC, PHURINE, GLUCOSEU, HGBUR, BILIRUBINUR, KETONESUR, PROTEINUR, UROBILINOGEN, NITRITE, LEUKOCYTESUR Sepsis Labs: @LABRCNTIP (procalcitonin:4,lacticidven:4) ) Recent Results (from the past 240 hour(s))  SARS Coronavirus 2 (CEPHEID - Performed in [facility] hospital lab), Hosp Order     Status: None   Collection Time: 04/25/19  1:18 AM   Specimen: Nasopharyngeal Swab  Result Value Ref Range Status   SARS Coronavirus 2 NEGATIVE NEGATIVE Final    Comment: (NOTE) If result is NEGATIVE SARS-CoV-2 target nucleic acids are NOT DETECTED. The SARS-CoV-2 RNA is generally detectable  in upper and lower  respiratory specimens during the acute phase of infection. The lowest  concentration of SARS-CoV-2 viral copies this assay can detect is 250  copies / mL. A negative result does not preclude SARS-CoV-2 infection  and should not be used as the sole basis for treatment or other  patient management decisions.  A negative result may occur with  improper specimen collection / handling, submission of specimen other  than nasopharyngeal swab, presence of viral mutation(s) within the  areas targeted by this assay, and inadequate number of viral copies  (<250 copies / mL). A negative result must be combined with clinical  observations, patient history, and epidemiological information. If result is POSITIVE SARS-CoV-2 target nucleic acids are DETECTED. The SARS-CoV-2 RNA is generally detectable in upper and lower  respiratory specimens dur ing the acute phase of infection.  Positive  results are indicative of active infection with SARS-CoV-2.  Clinical  correlation with patient history and other diagnostic information is  necessary to determine patient infection status.  Positive results do  not rule out bacterial infection or co-infection  with other viruses. If result is PRESUMPTIVE POSTIVE SARS-CoV-2 nucleic acids MAY BE PRESENT.   A presumptive positive result was obtained on the submitted specimen  and confirmed on repeat testing.  While 2019 novel coronavirus  (SARS-CoV-2) nucleic acids may be present in the submitted sample  additional confirmatory testing may be necessary for epidemiological  and / or clinical management purposes  to differentiate between  SARS-CoV-2 and other Sarbecovirus currently known to infect humans.  If clinically indicated additional testing with an alternate test  methodology [phone] is advised. The SARS-CoV-2 RNA is generally  detectable in upper and lower respiratory sp ecimens during the acute  phase of infection. The expected result is Negative. Fact Sheet for Patients:  BoilerBrush.com.cyhttps://www.fda.gov/media/136312/download Fact Sheet for Healthcare Providers: https://pope.com/https://www.fda.gov/media/136313/download This test is not yet approved or cleared by the [place] FDA and has been authorized for detection and/or diagnosis of SARS-CoV-2 by FDA under an Emergency Use Authorization (EUA).  This EUA will remain in effect (meaning this test can be used) for the duration of the COVID-19 declaration under Section 564(b)(1) of the Act, 21 U.S.C. section 360bbb-3(b)(1), unless the authorization is terminated or revoked sooner. Performed at [facility], [address] [place], [place] [address]          Radiology Studies: Dg Chest 2 View  Result Date: 04/24/2019 CLINICAL DATA:  Swelling of the legs in abdomen. History of congestive heart failure. EXAM: CHEST - 2 VIEW COMPARISON:  None. FINDINGS: Borderline cardiomegaly. Pulmonary venous hypertension without frank edema. No effusions. No focal bone lesion. Ordinary mild degenerative changes affect the spine. IMPRESSION: Borderline cardiomegaly. Pulmonary venous hypertension without frank edema or effusions. Electronically Signed    By: Paulina FusiMark  Shogry M.D.   On: 04/24/2019 17:29      Scheduled Meds: . aspirin EC  81 mg Oral Daily  . docusate sodium  100 mg Oral Daily  . DULoxetine  20 mg Oral Daily  . enoxaparin (LOVENOX) injection  80 mg Subcutaneous Daily  . furosemide  60 mg Intravenous Q8H  . levothyroxine  50 mcg Oral Daily  . lisinopril  5 mg Oral Daily  . LORazepam  0-4 mg Intravenous Q6H   Or  . LORazepam  0-4 mg Oral Q6H  . [START ON 04/27/2019] LORazepam  0-4 mg Intravenous Q12H   Or  . [START ON 04/27/2019] LORazepam  0-4 mg Oral  Q12H  . metoprolol tartrate  100 mg Oral BID  . multivitamin with minerals  1 tablet Oral Daily  . potassium chloride  40 mEq Oral Q4H  . sodium chloride flush  3 mL Intravenous Once  . sodium chloride flush  3 mL Intravenous Q12H  . thiamine  100 mg Oral Daily   Or  . thiamine  100 mg Intravenous Daily   Continuous Infusions: . sodium chloride    . potassium chloride       LOS: 1 day      Calvert CantorSaima Suhailah Kwan, MD [facility] Pager: [email or web address] Password [email or web address] 04/26/2019, 9:37 AM

## 2019-04-26 NOTE — Progress Notes (Signed)
ABI has been completed.   Preliminary results in CV Proc.   Abram Sander 04/26/2019 2:03 PM

## 2019-04-27 DIAGNOSIS — E662 Morbid (severe) obesity with alveolar hypoventilation: Secondary | ICD-10-CM

## 2019-04-27 DIAGNOSIS — R0609 Other forms of dyspnea: Secondary | ICD-10-CM

## 2019-04-27 DIAGNOSIS — L97519 Non-pressure chronic ulcer of other part of right foot with unspecified severity: Secondary | ICD-10-CM

## 2019-04-27 DIAGNOSIS — G4733 Obstructive sleep apnea (adult) (pediatric): Secondary | ICD-10-CM

## 2019-04-27 DIAGNOSIS — R600 Localized edema: Secondary | ICD-10-CM

## 2019-04-27 DIAGNOSIS — K7030 Alcoholic cirrhosis of liver without ascites: Secondary | ICD-10-CM

## 2019-04-27 DIAGNOSIS — E8770 Fluid overload, unspecified: Principal | ICD-10-CM

## 2019-04-27 LAB — BASIC METABOLIC PANEL
Anion gap: 11 (ref 5–15)
BUN: 6 mg/dL (ref 6–20)
CO2: 26 mmol/L (ref 22–32)
Calcium: 7.8 mg/dL — ABNORMAL LOW (ref 8.9–10.3)
Chloride: 101 mmol/L (ref 98–111)
Creatinine, Ser: 0.67 mg/dL (ref 0.61–1.24)
GFR calc Af Amer: >60 mL/min (ref >60)
GFR calc non Af Amer: >60 mL/min (ref >60)
Glucose, Bld: 91 mg/dL (ref 70–99)
Potassium: 3.1 mmol/L — ABNORMAL LOW (ref 3.5–5.1)
Sodium: 138 mmol/L (ref 135–145)

## 2019-04-27 LAB — FOLATE RBC
Folate, Hemolysate: 331 ng/mL
Folate, RBC: 1068 ng/mL (ref >498)
Hematocrit: 31 % — ABNORMAL LOW (ref 37.5–51.0)

## 2019-04-27 MED ORDER — POTASSIUM CHLORIDE CRYS ER 20 MEQ PO TBCR
40.0000 meq | EXTENDED_RELEASE_TABLET | ORAL | Status: AC
  Administered 2019-04-27 (×2): 40 meq via ORAL
  Filled 2019-04-27 (×2): qty 2

## 2019-04-27 MED ORDER — THIAMINE HCL 100 MG PO TABS: 100.0000 mg | ORAL_TABLET | Freq: Every day | ORAL | 0 refills | Status: DC

## 2019-04-27 MED ORDER — POTASSIUM CHLORIDE CRYS ER 20 MEQ PO TBCR: 40.0000 meq | EXTENDED_RELEASE_TABLET | Freq: Three times a day (TID) | ORAL | 0 refills | Status: DC

## 2019-04-27 MED ORDER — POTASSIUM CHLORIDE ER 20 MEQ PO TBCR: 40.0000 meq | EXTENDED_RELEASE_TABLET | Freq: Every day | ORAL | 0 refills | Status: DC

## 2019-04-27 NOTE — Progress Notes (Signed)
Per report from night RN- patient's O2 sats dropped to 85% while sleeping without the O2. Patient pulls it out of his nose. O2 sats maintained above 90% with O2 at 2L in place.

## 2019-04-27 NOTE — Discharge Summary (Addendum)
Physician Discharge Summary  [name] ZOX:[id number] DOB: Apr 22, 1961 DOA: 04/24/2019  PCP: [name], MD  Admit date: 04/24/2019 Discharge date: 04/27/2019  Admitted From: home Disposition:  home   Recommendations for Outpatient Follow-up:  1. Recommend a sleep study to be repeated as outpt  2. Needs aggressive weight loss 3. A potassium level should be checked in 1 wk 4. Needs to be encouraged to stop drinking alcohol 5. Needs referral to a lymphedema clinic  Home Health:  ordered     Discharge Condition:  stable   CODE STATUS:  Full code   Consultations:  none    Discharge Diagnoses:  Principal Problem:   Fluid overload Active Problems:  Alcoholic cirrhosis of liver without ascites Lymphedema    Morbid obesity (HCC)   OHS/ OSA (obstructive sleep apnea)   Foot ulcer (HCC)   DOE (dyspnea on exertion)   Hypothyroidism   Depression   Severe alcohol use disorder (HCC)   Diabetes mellitus without complication (HCC)    Brief Summary: [name] is a 58 y.o.malewith multiple medical problems including super morbid obesity,chronic alcohol use, hypertension, hyperlipidemia, diet-controlled diabetes,dCHF, OSA not on CPAP,  GERD, hypothyroidism, depression, who presents with shortness of breath, leg edema and weight gain.  Patient states that he has gained more than 60 pounds in the past 3 months. He states has worsening leg edema and edema in the abdomen. He developed shortness of breath as well and thus came into the hospital.He has been taking Lasix 40 mg BID and another small while pill but tells me he drinks a lot of water every day.  His CXR did not show pulmonary edema and he was not hypoxic.   Hospital Course:  Fluid overload - he claims he has gained 60 lb in the past few months - as his legs and abdomen were swollen, I increased lasix to 60 mg TID  - he insists his abdomen is filled with fluid as well and I suspected that he may have cirrhosis from  life long drinking-   I obtained an abdominal ultrasound yesterday- no ascites was found and thus, if any was present, he likely diuresed it off with the Lasix -   ECHO reviewed- no significant change from previous - he has no systolic or diastolic CHF -  see full report below - his legs have been wrapped in Unna boots- he is advised to wear the UNNA boots until his ulcer heals and subsequently return to ACE wraps - I have not increased the doses of his Lasix or Demadex but have advised him to limit his fluid intake to 1400 cc/hr and to weight himself daily   Active Problems: Ulcer on left foot- lymphedema of legs and feet - this is present on his heel and seems to have been a fluid filled blister which ruptured - wound care has evaluated it and recommended UNNA boots which were placed yesterday - with ACE wraps and IV Lasix, his pedal edema has resolved - we need to ensure that he has meticulous wound care to prevent another foot infection and save him from further amputations - Pedal pulses were difficult to palpate and ABIs were ordered- he is found to have normal blood flow to legs and feet.   Insensinate Neuropathy - states he barely has feeling in his feet- need close follow up of wound  Hypokalemia - due to Lasix -  replacing  - K is 3.1 today- I have increased his outpt KCL from 40  BID to TID- he will need a K+ level checked in 1 wk.   Elevated LFTs - ? Passive hepatic congestion in addition to current alcohol abuse in setting of cirrhosis of the liver - have been trending down  Cirrhosis of the liver- new diagnosis - noted on ultrasound of the liver performed yesterday  Morbid obesity - I have encouraged him to lose weight- he admits that his dyspnea on exertion is likely due to him being overweight   Obesity hypoventilation/ OSA - he sent back his CPAP machine a couple of years ago as he could not tolerate it - I have had a long discussion with him today about having  another sleep study done - pulse ox drops into the 80s when he ambulates and when he sleeps -I have ordered continurous O2 for now but  still strongly recommend he use a CPAP and lose weight  Alcohol dependence  - 1/2 gallon of vodka in 10 days now but in the past 1/2 gallon every 4 days - he has been drinking daily since he was in college - CIWA scale and ativan ordered for possible withdrawal- he has had only mild withdrawal symptoms - I have had a  conversation with him in regards to stopping drinking especially in light of his underlying cirrhosis which we have discovered during this hospital stay- social work consulted for resources to help him quit    Hypothyroidism - cont Synthroid    Hypertension - Lisinopril, Lasix, Metoprolol    Depression - Cymbalta    Diabetes mellitus without complication   - patient states he has been taken off of medications by his PCP as his sugars have been controlled  Procedures:   ECHO  1. The left ventricle has normal systolic function with an ejection fraction of 60-65%. The cavity size was mildly dilated. Left ventricular diastolic parameters were normal. No evidence of left ventricular regional wall motion abnormalities. 2. The right ventricle has normal systolic function. The cavity was normal. There is no increase in right ventricular wall thickness. 3. Left atrial size was not well visualized. 4. The aortic valve is tricuspid. Moderate sclerosis of the aortic valve.   Discharge Exam: Vitals:   04/26/19 2143 04/27/19 0542  BP: 113/63 110/66  Pulse: 72 67  Resp:  20  Temp:  98.1 F (36.7 C)  SpO2:  96%   Vitals:   04/26/19 2046 04/26/19 2143 04/27/19 0542 04/27/19 0634  BP: 99/61 113/63 110/66   Pulse: 74 72 67   Resp: 20  20   Temp: 97.9 F (36.6 C)  98.1 F (36.7 C)   TempSrc: Oral  Oral   SpO2: 96%  96%   Weight:    (!) 163.9 kg  Height:        General: Pt is alert, awake, not in acute distress Cardiovascular:  RRR, S1/S2 +, no rubs, no gallops Respiratory: CTA bilaterally, no wheezing, no rhonchi Abdominal: morbidly obese, Soft, NT, ND, bowel sounds + Extremities: no edema, no cyanosis Skin: ulcer present on outer aspect of left heel   Discharge Instructions  Discharge Instructions    Diet - low sodium heart healthy   Complete by: As directed    Try not to drink more than 1400 cc of fluid a day.   Increase activity slowly   Complete by: As directed      Allergies as of 04/27/2019      Reactions   Claritin [loratadine] Shortness Of Breath, Anxiety  Medication List    TAKE these medications   aspirin 81 MG chewable tablet Chew 1 tablet (81 mg total) by mouth 2 (two) times daily.   Benadryl Allergy 25 MG tablet Generic drug: diphenhydrAMINE Take 25 mg by mouth at bedtime as needed for allergies.   calcium carbonate 500 MG chewable tablet Commonly known as: TUMS - dosed in mg elemental calcium Chew 2 tablets by mouth at bedtime as needed for indigestion or heartburn.   docusate sodium 100 MG capsule Commonly known as: COLACE Take 1 capsule (100 mg total) by mouth 2 (two) times daily. What changed: when to take this   DULoxetine 20 MG capsule Commonly known as: CYMBALTA Take 1 capsule (20 mg total) by mouth daily.   furosemide 40 MG tablet Commonly known as: LASIX Take 40 mg by mouth 2 (two) times daily.   levothyroxine 50 MCG tablet Commonly known as: SYNTHROID Take 1 tablet (50 mcg total) by mouth daily.   metoprolol tartrate 100 MG tablet Commonly known as: LOPRESSOR TAKE 1 TABLET (100 MG TOTAL) BY MOUTH 2 (TWO) TIMES DAILY.   multivitamin with minerals Tabs tablet Take 1 tablet by mouth daily.   Potassium Chloride ER 20 MEQ Tbcr Take 40 mEq by mouth daily.   potassium chloride SA 20 MEQ tablet Commonly known as: Klor-Con M20 Take 2 tablets (40 mEq total) by mouth 2 (two) times daily. What changed: when to take this   senna 8.6 MG Tabs tablet Commonly  known as: SENOKOT Take 1 tablet (8.6 mg total) by mouth 2 (two) times daily. What changed:   when to take this  reasons to take this   thiamine 100 MG tablet Take 1 tablet (100 mg total) by mouth daily.   torsemide 20 MG tablet Commonly known as: DEMADEX Take 1 tablet (20 mg total) by mouth 2 (two) times daily.            Durable Medical Equipment  (From admission, onward)         Start     Ordered   04/27/19 0909  For home use only DME oxygen  Once    Question Answer Comment  Length of Need 6 Months   Mode or (Route) Nasal cannula   Liters per Minute 2   Frequency Only at night (stationary unit needed)   Oxygen conserving device No   Oxygen delivery system Gas      04/27/19 0908         Follow-up Information    Pincus SanesBurns, Stacy J, MD Follow up in 1 week(s).   Specialty: Internal Medicine Contact information: [address] [place] [address] [phone]          Allergies  Allergen Reactions  . Claritin [Loratadine] Shortness Of Breath and Anxiety     Procedures/Studies:    Dg Chest 2 View  Result Date: 04/24/2019 CLINICAL DATA:  Swelling of the legs in abdomen. History of congestive heart failure. EXAM: CHEST - 2 VIEW COMPARISON:  None. FINDINGS: Borderline cardiomegaly. Pulmonary venous hypertension without frank edema. No effusions. No focal bone lesion. Ordinary mild degenerative changes affect the spine. IMPRESSION: Borderline cardiomegaly. Pulmonary venous hypertension without frank edema or effusions. Electronically Signed   By: Paulina FusiMark  Shogry M.D.   On: 04/24/2019 17:29   [place] Abdomen Complete  Result Date: 04/26/2019 CLINICAL DATA:  58 year old presenting with abdominal distention and generalized abdominal pain. EXAM: ABDOMEN ULTRASOUND COMPLETE COMPARISON:  12/14/2016. FINDINGS: Gallbladder: Contracted with marked wall thickening up to approximately  8 mm. No shadowing gallstones. Negative sonographic Murphy's sign according to the ultrasound  technologist. Common bile duct: Diameter: Approximately 7 mm centrally. Obscured distally by duodenal bowel gas. Liver: Contracted liver with markedly irregular contour and heterogeneous echotexture diffusely. No visible hepatic parenchymal masses. Portal vein is patent on color Doppler imaging with normal direction of blood flow towards the liver. IVC: Not visualized due to extensive midline bowel gas. Pancreas: Not visualized due to extensive midline bowel gas. Spleen: Moderate splenomegaly with a volume of approximately 686 mL. No focal splenic parenchymal abnormalities. Right Kidney: Length: Approximately 12.7 cm. No hydronephrosis. Well-preserved cortex. No shadowing calculi. Normal parenchymal echotexture. No focal parenchymal abnormality. Left Kidney: Length: Approximately 12.3 cm. No hydronephrosis. Well-preserved cortex. No shadowing calculi. Normal parenchymal echotexture. No focal parenchymal abnormality. Abdominal aorta: Not visualized due to extensive midline bowel gas. Other findings: Small amount of ascites in the RIGHT UPPER QUADRANT, LEFT UPPER QUADRANT and RIGHT LOWER QUADRANT. IMPRESSION: 1. Hepatic cirrhosis. No visible hepatic parenchymal masses. Patent portal vein with normal hepatopetal flow. 2. Marked gallbladder wall thickening without evidence of cholelithiasis. This may be due to severe hepatocellular disease. Chronic acalculous cholecystitis could have a similar appearance. 3. Moderate splenomegaly. 4. The midline structures (parenchyma risk, IVC and abdominal aorta) were obscured by bowel gas and are therefore not evaluated. Electronically Signed   By: Hulan Saas M.D.   On: 04/26/2019 18:07   Vas [place] Vanice Sarah With/wo Tbi  Result Date: 04/26/2019 LOWER EXTREMITY DOPPLER STUDY Indications: Ulceration, and Poor pulses. High Risk Factors: Hypertension, hyperlipidemia, Diabetes.  Comparison Study: Prior study done 02/17/18 Performing Technologist: Blanch Media RVS  Examination Guidelines: A  complete evaluation includes at minimum, Doppler waveform signals and systolic blood pressure reading at the level of bilateral brachial, anterior tibial, and posterior tibial arteries, when vessel segments are accessible. Bilateral testing is considered an integral part of a complete examination. Photoelectric Plethysmograph (PPG) waveforms and toe systolic pressure readings are included as required and additional duplex testing as needed. Limited examinations for reoccurring indications may be performed as noted.  ABI Findings: +--------+------------------+-----+---------+--------+ Right   Rt Pressure (mmHg)IndexWaveform Comment  +--------+------------------+-----+---------+--------+ [id number]                                      +--------+------------------+-----+---------+--------+ PTA     165               1.13 triphasic         +--------+------------------+-----+---------+--------+ DP      166               1.14 triphasic         +--------+------------------+-----+---------+--------+ +--------+------------------+-----+---------+-------+ Left    Lt Pressure (mmHg)IndexWaveform Comment +--------+------------------+-----+---------+-------+ [id number]                                     +--------+------------------+-----+---------+-------+ PTA     156               1.07 triphasic        +--------+------------------+-----+---------+-------+ DP      156               1.07 triphasic        +--------+------------------+-----+---------+-------+ +-------+-----------+-----------+------------+------------+ ABI/TBIToday's ABIToday's TBIPrevious ABIPrevious TBI +-------+-----------+-----------+------------+------------+ Right  1.14                                           +-------+-----------+-----------+------------+------------+  Left   1.07                                           +-------+-----------+-----------+------------+------------+  Summary:  Right: Resting right ankle-brachial index is within normal range. No evidence of significant right lower extremity arterial disease. Left: Resting left ankle-brachial index is within normal range. No evidence of significant left lower extremity arterial disease.  *See table(s) above for measurements and observations.  Electronically signed by Servando Snare MD on 04/26/2019 at 5:06:49 PM.   Final      The results of significant diagnostics from this hospitalization (including imaging, microbiology, ancillary and laboratory) are listed below for reference.     Microbiology: Recent Results (from the past 240 hour(s))  SARS Coronavirus 2 (CEPHEID - Performed in [place] hospital lab), Hosp Order     Status: None   Collection Time: 04/25/19  1:18 AM   Specimen: Nasopharyngeal Swab  Result Value Ref Range Status   SARS Coronavirus 2 NEGATIVE NEGATIVE Final    Comment: (NOTE) If result is NEGATIVE SARS-CoV-2 target nucleic acids are NOT DETECTED. The SARS-CoV-2 RNA is generally detectable in upper and lower  respiratory specimens during the acute phase of infection. The lowest  concentration of SARS-CoV-2 viral copies this assay can detect is 250  copies / mL. A negative result does not preclude SARS-CoV-2 infection  and should not be used as the sole basis for treatment or other  patient management decisions.  A negative result may occur with  improper specimen collection / handling, submission of specimen other  than nasopharyngeal swab, presence of viral mutation(s) within the  areas targeted by this assay, and inadequate number of viral copies  (<250 copies / mL). A negative result must be combined with clinical  observations, patient history, and epidemiological information. If result is POSITIVE SARS-CoV-2 target nucleic acids are DETECTED. The SARS-CoV-2 RNA is generally detectable in upper and lower  respiratory specimens dur ing the acute phase of infection.  Positive  results are  indicative of active infection with SARS-CoV-2.  Clinical  correlation with patient history and other diagnostic information is  necessary to determine patient infection status.  Positive results do  not rule out bacterial infection or co-infection with other viruses. If result is PRESUMPTIVE POSTIVE SARS-CoV-2 nucleic acids MAY BE PRESENT.   A presumptive positive result was obtained on the submitted specimen  and confirmed on repeat testing.  While 2019 novel coronavirus  (SARS-CoV-2) nucleic acids may be present in the submitted sample  additional confirmatory testing may be necessary for epidemiological  and / or clinical management purposes  to differentiate between  SARS-CoV-2 and other Sarbecovirus currently known to infect humans.  If clinically indicated additional testing with an alternate test  methodology [phone]) is advised. The SARS-CoV-2 RNA is generally  detectable in upper and lower respiratory sp ecimens during the acute  phase of infection. The expected result is Negative. Fact Sheet for Patients:  StrictlyIdeas.no Fact Sheet for Healthcare Providers: BankingDealers.co.za This test is not yet approved or cleared by the [place] FDA and has been authorized for detection and/or diagnosis of SARS-CoV-2 by FDA under an Emergency Use Authorization (EUA).  This EUA will remain in effect (meaning this test can be used) for the duration of the COVID-19 declaration under Section 564(b)(1) of the Act, 21 U.S.C. section 360bbb-3(b)(1), unless the authorization is terminated  or revoked sooner. Performed at [facility], [address] [place], [place] [address]      Labs: BNP (last 3 results) Recent Labs    04/24/19 2009  BNP 165.3*   Basic Metabolic Panel: Recent Labs  Lab 04/23/19 1048 04/24/19 2009 04/25/19 0454 04/26/19 0445 04/27/19 0346  NA 136 135 138 137 138  K 2.8* 2.7* 2.8* 2.7*  3.1*  CL 96 97* 100 99 101  CO2 25 26 21* 26 26  GLUCOSE 121* 124* 111* 107* 91  BUN 6 <5* <5* <5* 6  CREATININE 0.71 0.57* 0.63 0.66 0.67  CALCIUM 8.5 8.3* 8.0* 7.9* 7.8*  MG  --   --  1.9 1.8  --    Liver Function Tests: Recent Labs  Lab 04/23/19 1048 04/25/19 0454  AST 38* 42*  ALT 13 16  ALKPHOS 226* 185*  BILITOT 3.1* 2.9*  PROT 7.5 7.0  ALBUMIN 3.0* 2.5*   No results for input(s): LIPASE, AMYLASE in the last 168 hours. No results for input(s): AMMONIA in the last 168 hours. CBC: Recent Labs  Lab 04/23/19 1048 04/24/19 2009  WBC 10.5 9.0  NEUTROABS 7.5  --   HGB 11.4* 11.0*  HCT 33.8* 34.7*  MCV 101.7* 104.8*  PLT 277.0 270   Cardiac Enzymes: No results for input(s): CKTOTAL, CKMB, CKMBINDEX, TROPONINI in the last 168 hours. BNP: Invalid input(s): POCBNP CBG: No results for input(s): GLUCAP in the last 168 hours. D-Dimer No results for input(s): DDIMER in the last 72 hours. Hgb A1c No results for input(s): HGBA1C in the last 72 hours. Lipid Profile No results for input(s): CHOL, HDL, LDLCALC, TRIG, CHOLHDL, LDLDIRECT in the last 72 hours. Thyroid function studies No results for input(s): TSH, T4TOTAL, T3FREE, THYROIDAB in the last 72 hours.  Invalid input(s): FREET3 Anemia work up Recent Labs    04/26/19 1007  VITAMINB12 442   Urinalysis No results found for: COLORURINE, APPEARANCEUR, LABSPEC, PHURINE, GLUCOSEU, HGBUR, BILIRUBINUR, KETONESUR, PROTEINUR, UROBILINOGEN, NITRITE, LEUKOCYTESUR Sepsis Labs Invalid input(s): PROCALCITONIN,  WBC,  LACTICIDVEN Microbiology Recent Results (from the past 240 hour(s))  SARS Coronavirus 2 (CEPHEID - Performed in [facility] hospital lab), Hosp Order     Status: None   Collection Time: 04/25/19  1:18 AM   Specimen: Nasopharyngeal Swab  Result Value Ref Range Status   SARS Coronavirus 2 NEGATIVE NEGATIVE Final    Comment: (NOTE) If result is NEGATIVE SARS-CoV-2 target nucleic acids are NOT DETECTED. The  SARS-CoV-2 RNA is generally detectable in upper and lower  respiratory specimens during the acute phase of infection. The lowest  concentration of SARS-CoV-2 viral copies this assay can detect is 250  copies / mL. A negative result does not preclude SARS-CoV-2 infection  and should not be used as the sole basis for treatment or other  patient management decisions.  A negative result may occur with  improper specimen collection / handling, submission of specimen other  than nasopharyngeal swab, presence of viral mutation(s) within the  areas targeted by this assay, and inadequate number of viral copies  (<250 copies / mL). A negative result must be combined with clinical  observations, patient history, and epidemiological information. If result is POSITIVE SARS-CoV-2 target nucleic acids are DETECTED. The SARS-CoV-2 RNA is generally detectable in upper and lower  respiratory specimens dur ing the acute phase of infection.  Positive  results are indicative of active infection with SARS-CoV-2.  Clinical  correlation with patient history and other diagnostic information is  necessary to determine patient infection status.  Positive results do  not rule out bacterial infection or co-infection with other viruses. If result is PRESUMPTIVE POSTIVE SARS-CoV-2 nucleic acids MAY BE PRESENT.   A presumptive positive result was obtained on the submitted specimen  and confirmed on repeat testing.  While 2019 novel coronavirus  (SARS-CoV-2) nucleic acids may be present in the submitted sample  additional confirmatory testing may be necessary for epidemiological  and / or clinical management purposes  to differentiate between  SARS-CoV-2 and other Sarbecovirus currently known to infect humans.  If clinically indicated additional testing with an alternate test  methodology [id number]) is advised. The SARS-CoV-2 RNA is generally  detectable in upper and lower respiratory sp ecimens during the acute   phase of infection. The expected result is Negative. Fact Sheet for Patients:  BoilerBrush.com.cyhttps://www.fda.gov/media/136312/download Fact Sheet for Healthcare Providers: https://pope.com/https://www.fda.gov/media/136313/download This test is not yet approved or cleared by the [place] FDA and has been authorized for detection and/or diagnosis of SARS-CoV-2 by FDA under an Emergency Use Authorization (EUA).  This EUA will remain in effect (meaning this test can be used) for the duration of the COVID-19 declaration under Section 564(b)(1) of the Act, 21 U.S.C. section 360bbb-3(b)(1), unless the authorization is terminated or revoked sooner. Performed at [facility], [address] [place], [place] [address]      Time coordinating discharge in minutes: 65  SIGNED:   Calvert CantorSaima Rayvin Abid, MD  [facility] 04/27/2019, 9:14 AM Pager   If 7PM-7AM, please contact night-coverage [email or web address] Password [email or web address]

## 2019-04-27 NOTE — Progress Notes (Signed)
SATURATION QUALIFICATIONS: (This note is used to comply with regulatory documentation for home oxygen)  Patient Saturations on Room Air at Rest = 91%  Patient Saturations on Room Air while Ambulating = 85%  Patient Saturations on 2 Liters of oxygen while Ambulating = 93%  Please briefly explain why patient needs home oxygen: Desats with ambulation and while sleeping.

## 2019-04-27 NOTE — Consult Note (Signed)
   [facility] CM Inpatient Consult   04/27/2019  [name] August 21, 1961 [id number]  Patient screened for potential [facility] Care Management services due to unplanned readmission risk score of 28% and 3 hospitalizations within the past 6 months.   Spoke with [name] by telephone and explained [facility] for chronic disease management and community support. Referral had been placed in May 2020 for community follow up but outreaches had been unsuccessful. Patient verbalizes that he consents for community follow up when he transitions back home.  Referral will be placed for [facility] CM complex case management follow up. Of note, [facility] Care Management services does not replace or interfere with any services that are arranged by inpatient case management or social work.  Netta Cedars, MSN, [facility] Liaison Nurse Mobile Phone [phone]  Toll free office [phone]

## 2019-04-27 NOTE — Discharge Instructions (Signed)
Please try to lose weight. It will help your shortness of breath.  Do not drink more than 1400 cc of fluids per day. Weigh yourself daily and try not to gain fluid weight.  Try to stop drinking alcohol. You have developed Cirrhosis of the liver which is a condition that can kill you if you do not stop drinking.  You will need another sleep study. It is important to wear a CPAP to make sure that your oxygen level does not drop overnight.   You were cared for by a hospitalist during your hospital stay. If you have any questions about your discharge medications or the care you received while you were in the hospital after you are discharged, you can call the unit and asked to speak with the hospitalist on call if the hospitalist that took care of you is not available. Once you are discharged, your primary care physician will handle any further medical issues.   Please note that NO REFILLS for any discharge medications will be authorized once you are discharged, as it is imperative that you return to your primary care physician (or establish a relationship with a primary care physician if you do not have one) for your aftercare needs so that they can reassess your need for medications and monitor your lab values.  Please take all your medications with you for your next visit with your Primary MD. Please ask your Primary MD to get all Hospital records sent to his/her office. Please request your Primary MD to go over all hospital test results at the follow up.   If you experience worsening of your admission symptoms, develop shortness of breath, chest pain, suicidal or homicidal thoughts or a life threatening emergency, you must seek medical attention immediately by calling 911 or calling your MD.   Bonita QuinYou must read the complete instructions/literature along with all the possible adverse reactions/side effects for all the medicines you take including new medications that have been prescribed to you. Take new  medicines after you have completely understood and accpet all the possible adverse reactions/side effects.    Do not drive when taking pain medications or sedatives.     Do not take more than prescribed Pain, Sleep and Anxiety Medications   If you have smoked or chewed Tobacco in the last 2 yrs please stop. Stop any regular alcohol  and or recreational drug use.   Wear Seat belts while driving.  Cirrhosis  Cirrhosis is long-term (chronic) liver injury. The liver is the body's largest internal organ, and it performs many functions. It converts food into energy, removes toxic material from the blood, makes important proteins, and absorbs necessary vitamins from food. In cirrhosis, healthy liver cells are replaced by scar tissue. This prevents blood from flowing through the liver, making it difficult for the liver to function. Scarring of the liver cannot be reversed, but treatment can prevent it from getting worse. What are the causes? Common causes of this condition are hepatitis C and long-term alcohol abuse. Other causes include:  Nonalcoholic fatty liver disease. This happens when fat is deposited in the liver by causes other than alcohol.  Hepatitis B infection.  Autoimmune hepatitis. In this condition, the body's defense system (immune system) mistakenly attacks the liver cells, causing irritation and swelling (inflammation).  Diseases that cause blockage of ducts inside the liver.  Inherited liver diseases, such as hemochromatosis. This is one of the most common inherited liver diseases. In this disease, deposits of iron collect in the  liver and other organs.  Reactions to certain long-term medicines, such as amiodarone, a heart medicine.  Parasitic infections. These include schistosomiasis, which is caused by a flatworm.  Long-term contact to certain toxins. These toxins include certain organic solvents, such as toluene and chloroform. What increases the risk? You are more  likely to develop this condition if:  You have certain types of viral hepatitis.  You abuse alcohol, especially if you are male.  You are overweight.  You share needles.  You have unprotected sex with someone who has viral hepatitis. What are the signs or symptoms? You may not have any signs and symptoms at first. Symptoms may not develop until the damage to your liver starts to get worse. Early symptoms may include:  Weakness and tiredness (fatigue).  Changes in sleep patterns or having trouble sleeping.  Itchiness.  Tenderness in the right-upper part of your abdomen.  Weight loss and muscle loss.  Nausea.  Loss of appetite.  Appearance of tiny blood vessels under the skin. Later symptoms may include:  Fatigue or weakness that is getting worse.  Yellow skin and eyes (jaundice).  Buildup of fluid in the abdomen (ascites). You may notice that your clothes are tight around your waist.  Weight gain.  Swelling of the feet and ankles (edema).  Trouble breathing.  Easy bruising and bleeding.  Vomiting blood.  Black or bloody stool.  Mental confusion. How is this diagnosed? Your health care provider may suspect cirrhosis based on your symptoms and medical history, especially if you have other medical conditions or a history of alcohol abuse. Your health care provider will do a physical exam to feel your liver and to check for signs of cirrhosis. He or she may perform other tests, including:  Blood tests to check: ? For hepatitis B or C. ? Kidney function. ? Liver function.  Imaging tests such as: ? MRI or CT scan to look for changes seen in advanced cirrhosis. ? Ultrasound to see if normal liver tissue is being replaced by scar tissue.  A procedure in which a long needle is used to take a sample of liver tissue to be checked in a lab (biopsy). Liver biopsy can confirm the diagnosis of cirrhosis. How is this treated? Treatment for this condition depends on  how damaged your liver is and what caused the damage. It may include treating the symptoms of cirrhosis, or treating the underlying causes in order to slow the damage. Treatment may include:  Making lifestyle changes, such as: ? Eating a healthy diet. You may need to work with your health care provider or a diet and nutrition specialist (dietitian) to develop an eating plan. ? Restricting salt intake. ? Maintaining a healthy weight. ? Not abusing drugs or alcohol.  Taking medicines to: ? Treat liver infections or other infections. ? Control itching. ? Reduce fluid buildup. ? Reduce certain blood toxins. ? Reduce risk of bleeding from enlarged blood vessels in the stomach or esophagus (varices).  Liver transplant. In this procedure, a liver from a donor is used to replace your diseased liver. This is done if cirrhosis has caused liver failure. Other treatments and procedures may be done depending on the problems that you get from cirrhosis. Common problems include liver-related kidney failure (hepatorenal syndrome). Follow these instructions at home:   Take medicines only as told by your health care provider. Do not use medicines that are toxic to your liver. Ask your health care provider before taking any new medicines, including  over-the-counter medicines.  Rest as needed.  Eat a well-balanced diet. Ask your health care provider or dietitian for more information.  Limit your salt or water intake, if your health care provider asks you to do this.  Do not drink alcohol. This is especially important if you are taking acetaminophen.  Keep all follow-up visits as told by your health care provider. This is important. Contact a health care provider if you:  Have fatigue or weakness that is getting worse.  Develop swelling of the hands, feet, legs, or face.  Have a fever.  Develop loss of appetite.  Have nausea or vomiting.  Develop jaundice.  Develop easy bruising or  bleeding. Get help right away if you:  Vomit bright red blood or a material that looks like coffee grounds.  Have blood in your stools.  Notice that your stools appear black and tarry.  Become confused.  Have chest pain or trouble breathing. Summary  Cirrhosis is chronic liver injury. Liver damage cannot be reversed. Common causes are hepatitis C and long-term alcohol abuse.  Tests used to diagnose cirrhosis include blood tests, imaging tests, and liver biopsy.  Treatment for this condition involves treating the underlying cause. Avoid alcohol, drugs, salt, and medicines that may damage your liver.  Contact your health care provider if you develop ascites, edema, jaundice, fever, nausea or vomiting, easy bruising or bleeding, or worsening fatigue. This information is not intended to replace advice given to you by your health care provider. Make sure you discuss any questions you have with your health care provider. Document Released: 10/04/2005 Document Revised: 01/24/2019 Document Reviewed: 08/24/2017 Elsevier Patient Education  2020 [employer]vier Inc. Alcoholic Liver Disease  Alcoholic liver disease is liver damage that is caused by drinking a lot of alcohol for a long time. If you have this disease, you must stop drinking alcohol. Follow these instructions at home:   Do not drink alcohol. Follow your treatment plan. Work with your doctor if you need help.  Think about joining an alcohol support group.  Take over-the-counter and prescription medicines only as told by your doctor. These include vitamins.  Do not use medicines or eat foods that have alcohol in them, unless your doctor says that it is safe.  Follow instructions from your doctor about eating a healthy diet.  Keep all follow-up visits as told by your doctor. This is important. Contact a doctor if:  You get a fever.  Your skin starts to look more yellow, pale, or dark.  You get headaches. Get help right away  if:  You throw up (vomit) blood.  You have bright red blood in your poop (stool).  Your poop looks black or looks like tar.  You have trouble: ? Thinking. ? Walking. ? Balancing. ? Breathing. Summary  Alcoholic liver disease is liver damage that is caused by drinking a lot of alcohol for a long time.  If you have this disease, you must stop drinking alcohol. Follow your treatment plan, and work with your doctor as needed.  Think about joining an alcohol support group. This information is not intended to replace advice given to you by your health care provider. Make sure you discuss any questions you have with your health care provider. Document Released: 08/01/2009 Document Revised: 01/23/2019 Document Reviewed: 06/24/2017 Elsevier Patient Education  2020 [employer]

## 2019-04-27 NOTE — Progress Notes (Signed)
Patient is stable for discharge. Discharge instructions and medications have been reviewed with the patient and all questions answered. AVS and prescriptions given to patient. Reviewed oxygen equipment with the patient and his girlfriend [name].

## 2019-04-27 NOTE — TOC Initial Note (Signed)
Transition of Care [facility]) - Initial/Assessment Note    Patient Details  Name: [name] MRN: [id number] Date of Birth: Nov 07, 1960  Transition of Care [facility]) CM/SW Contact:    Nila Nephew, LCSW Phone Number: [phone] 04/27/2019, 1:47 PM  Clinical Narrative:    Pt admitted with fluid overload, from home where he resides with his significant other. Pt reports that he was going to OP therapy PTA and will be able to return on august 1st due to insurance, denies any other therapy needs. CSW and pt discussed alcohol use and pt reports over last few months use has been increasing, stress and boredom contributing. States he has been experiencing anxiety and mild withdrawal symptoms but reports last use 2 weeks ago. Reports alcohol use has been problematic for him over the years and that he and SO are looking into either OP therapy or residential treatment for him once he returns home from this hospitalization. Pt declines any community resources, citing familiarity with options and working with his insurance.  Spoke with pt's SO as well, she mentions they are looking into [facility] for etoh treatment, and CSW provided her with other options as well.  Home O2 requirement, [employer] to deliver portable tank to room prior to DC and SO calling agency to set up home delivery time.                Expected Discharge Plan: Home/Self Care Barriers to Discharge: No Barriers Identified   Patient Goals and CMS Choice Patient states their goals for this hospitalization and ongoing recovery are:: "ready to get home" CMS Medicare.gov Compare Post Acute Care list provided to:: Patient Choice offered to / list presented to : NA  Expected Discharge Plan and Services Expected Discharge Plan: Home/Self Care In-house Referral: Clinical Social Work Discharge Planning Services: CM Consult Post Acute Care Choice: Durable Medical Equipment Living arrangements for the past 2 months: Single Family  Home Expected Discharge Date: 04/27/19               DME Arranged: Oxygen DME Agency: [employer] Date DME Agency Contacted: 04/27/19 Time DME Agency Contacted: 1000 Representative spoke with at DME Agency: Zack            Prior Living Arrangements/Services Living arrangements for the past 2 months: [place] Lives with:: Significant Other Patient language and need for interpreter reviewed:: No Do you feel safe going back to the place where you live?: Yes      Need for Family Participation in Patient Care: No (Comment) Care giver support system in place?: Yes (comment)(SO)   Criminal Activity/Legal Involvement Pertinent to Current Situation/Hospitalization: No - Comment as needed  Activities of Daily Living Home Assistive Devices/Equipment: Blood pressure cuff, Scales, Eyeglasses, CBG Meter, Raised toilet seat with rails ADL Screening (condition at time of admission) Patient's cognitive ability adequate to safely complete daily activities?: Yes Is the patient deaf or have difficulty hearing?: No Does the patient have difficulty seeing, even when wearing glasses/contacts?: No Does the patient have difficulty concentrating, remembering, or making decisions?: No Patient able to express need for assistance with ADLs?: Yes Does the patient have difficulty dressing or bathing?: Yes Independently performs ADLs?: Yes (appropriate for developmental age) Does the patient have difficulty walking or climbing stairs?: Yes Weakness of Legs: Both Weakness of Arms/Hands: None  Permission Sought/Granted                  Emotional Assessment Appearance:: Appears stated age Attitude/Demeanor/Rapport: Engaged Affect (  typically observed): Accepting, Adaptable Orientation: : Oriented to Self, Oriented to Place, Oriented to  Time, Oriented to Situation Alcohol / Substance Use: Alcohol Use Psych Involvement: No (comment)  Admission diagnosis:  Acute on chronic diastolic  congestive heart failure (HCC) [I50.33] Severe alcohol use disorder (HCC) [F10.20] Patient Active Problem List   Diagnosis Date Noted  . Alcoholic cirrhosis of liver without ascites (HCC) 04/27/2019  . Obesity hypoventilation syndrome (HCC) 04/27/2019  . Depression 04/25/2019  . Acute on chronic diastolic (congestive) heart failure (HCC) 04/25/2019  . Diabetes mellitus without complication (HCC) 04/25/2019  . Severe alcohol use disorder (HCC)   . DOE (dyspnea on exertion) 04/22/2019  . Hypothyroidism 04/22/2019  . Anemia 02/06/2019  . Cold intolerance 02/06/2019  . Acute on chronic diastolic CHF (congestive heart failure) (HCC) 01/30/2019  . Type 2 diabetes mellitus with hyperlipidemia (HCC) 01/29/2019  . Avascular necrosis of hip, left (HCC) 11/02/2018  . Decreased hearing of both ears 10/30/2018  . Avascular necrosis of hip, right (HCC) 08/16/2018  . Bilateral leg edema 02/27/2018  . Diabetic foot infection (HCC) 02/16/2018  . Marijuana abuse 02/16/2018  . Diabetic ulcer of right midfoot associated with type 2 diabetes mellitus, with necrosis of muscle (HCC) 11/16/2017  . Foot ulcer (HCC) 12/21/2016  . Bilateral carpal tunnel syndrome 12/15/2016  . Fatty liver 12/14/2016  . Hyperlipidemia 11/25/2016  . Elevated liver enzymes 11/25/2016  . Diabetes (HCC) 11/25/2016  . Hypertension 11/23/2016  . Neuropathy 11/23/2016  . Toe amputation status, right 11/23/2016  . Morbid obesity (HCC) 11/23/2016  . OSA (obstructive sleep apnea) 11/23/2016  . Other hammer toe (acquired) 11/11/2013  . Exstrophy of bladder 11/11/2013   PCP:  Pincus SanesBurns, Stacy J, MD Pharmacy:   [facility] [phone][id number] [address]- [place], [place] - [id number] [place] [id number] Phone: [phone] Fax: [phone]     Social Determinants of Health (SDOH) Interventions    Readmission Risk Interventions No flowsheet data found.

## 2019-04-27 NOTE — Evaluation (Signed)
Physical Therapy Evaluation Patient Details Name: [name] MRN: [id number] DOB: 06-Apr-1961 Today's Date: [name]   History of Present Illness  [name]  is a 58 y.o. male with medical history significant of hypertension, hyperlipidemia, diet-controlled diabetes, dCHF, OSA not on CPAP, morbid obesity, alcohol abuse, GERD, hypothyroidism, depression, who presents with shortness of breath, leg edema and weight gain.  Clinical Impression  [name] admitted with above diagnosis. [name] currently with functional limitations due to the deficits listed below (see [name] Problem List). [name] will benefit from skilled [name] to increase their independence and safety with mobility to allow discharge to the venue listed below.  [name] de-sat to 85% on room air with 40' gait in room with RW.  [name] was receiving outpatient [name] prior to admission and recommend continuing with this.     Follow Up Recommendations Outpatient [name]    Equipment Recommendations  None recommended by [name]    Recommendations for Other Services       Precautions / Restrictions Precautions Precaution Comments: decreased sensation and skin integrity B feet Restrictions Weight Bearing Restrictions: No Other Position/Activity Restrictions: wears Darco shoes      Mobility  Bed Mobility Overal bed mobility: Needs Assistance Bed Mobility: Supine to Sit     Supine to sit: Min guard;HOB elevated     General bed mobility comments: HOB elevated and heavy use of rail, but no physical A needed  Transfers Overall transfer level: Needs assistance Equipment used: Rolling walker (2 wheeled) Transfers: Sit to/from Stand Sit to Stand: Min assist         General transfer comment: MIN A given to secure RW. [name] has 1 had on RW and 1 on bed and was fearful RW would move. No physical A given to him, but elevated surface. Uses lift chair at home.  Ambulation/Gait Ambulation/Gait assistance: Min guard Gait Distance (Feet): 40 Feet Assistive device:  Rolling walker (2 wheeled) Gait Pattern/deviations: Decreased step length - right;Decreased step length - left;Wide base of support     General Gait Details: AMb 4' in room with his RW and on Room Air. 2/4 dyspnea at end of gait. o2 85% after gait. returned to 91 within30 seconds, but then dropped into mid 80s for 30 seconds then returned to 92% and appeared to stabilize  Stairs            Wheelchair Mobility    Modified Rankin (Stroke Patients Only)       Balance Overall balance assessment: Mild deficits observed, not formally tested                                           Pertinent Vitals/Pain Pain Assessment: 0-10 Pain Score: 8  Pain Location: headache Pain Descriptors / Indicators: Headache Pain Intervention(s): Limited activity within patient's tolerance;Monitored during session    [name] expects to be discharged to:: Private residence Living Arrangements: Spouse/significant other Available Help at Discharge: Family;Available PRN/intermittently Type of Home: House Home Access: Stairs to enter Entrance Stairs-Rails: Psychiatric nurse of Steps: 3 Home Layout: Able to live on main level with bedroom/bathroom;Two level Home Equipment: Walker - 4 wheels;Crutches;Bedside commode;Walker - standard;Walker - 2 wheels      Prior Function Level of Independence: Independent with assistive device(s)         Comments: Amb with rollator and sleeps in lift chair     Hand Dominance  Dominant Hand: Right    Extremity/Trunk Assessment   Upper Extremity Assessment Upper Extremity Assessment: Overall WFL for tasks assessed    Lower Extremity Assessment Lower Extremity Assessment: RLE deficits/detail;LLE deficits/detail;Generalized weakness RLE Sensation: history of peripheral neuropathy LLE Sensation: history of peripheral neuropathy       Communication   Communication: No difficulties  Cognition  Arousal/Alertness: Awake/alert Behavior During Therapy: WFL for tasks assessed/performed Overall Cognitive Status: Within Functional Limits for tasks assessed                                        General Comments General comments (skin integrity, edema, etc.): decreased sensation in feet    Exercises     Assessment/Plan    [name] Assessment Patient needs continued [name] services  [name] Problem List Decreased strength;Decreased activity tolerance;Decreased balance;Decreased mobility;Decreased skin integrity       [name] Treatment Interventions DME instruction;Gait training;Stair training;Functional mobility training;Balance training;Therapeutic exercise;Therapeutic activities;Patient/family education    [name] Goals (Current goals can be found in the Care Plan section)  Acute Rehab [name] Goals Patient Stated Goal: get stronger [name] Goal Formulation: With patient Time For Goal Achievement: 05/11/19 Potential to Achieve Goals: Good    Frequency Min 3X/week   Barriers to discharge Inaccessible home environment stairs to enter home    Co-evaluation               AM-PAC [name] "6 Clicks" Mobility  Outcome Measure Help needed turning from your back to your side while in a flat bed without using bedrails?: A Little Help needed moving from lying on your back to sitting on the side of a flat bed without using bedrails?: A Little Help needed moving to and from a bed to a chair (including a wheelchair)?: A Little Help needed standing up from a chair using your arms (e.g., wheelchair or bedside chair)?: A Little Help needed to walk in hospital room?: A Little Help needed climbing 3-5 steps with a railing? : A Little 6 Click Score: 18    End of Session Equipment Utilized During Treatment: Gait belt Activity Tolerance: Patient tolerated treatment well Patient left: in bed;Other (comment)(sitting EOB eating breakfast) Nurse Communication: Mobility status [name] Visit Diagnosis: Difficulty  in walking, not elsewhere classified (R26.2)    Time: [id number] [name] Time Calculation (min) (ACUTE ONLY): 22 min   Charges:   [name] Evaluation $[name] Eval Low Complexity: 1 Low          [name], [name]   [name] [name], 9:41 AM

## 2019-04-30 ENCOUNTER — Other Ambulatory Visit: Payer: Self-pay | Admitting: *Deleted

## 2019-04-30 ENCOUNTER — Ambulatory Visit: Payer: BLUE CROSS/BLUE SHIELD | Admitting: Internal Medicine

## 2019-04-30 NOTE — Patient Outreach (Signed)
[facility]) Care Management  04/30/2019  [name] 1961-10-02 [id number]    Referral received 04/27/2019 Initial Outreach 04/30/2019 Provider's office to completed the transition of care  RN attempted outreach call today however unsuccessful. RN able to leave a HIPAA approved voice message requesting a call back. Will further engage at that time for pending [facility] services.   Will scheduled another outreach call this week.   [name], RN Care Management Coordinator [place] Office [phone]

## 2019-05-01 ENCOUNTER — Other Ambulatory Visit: Payer: Self-pay | Admitting: *Deleted

## 2019-05-01 NOTE — Patient Outreach (Signed)
[facility]) Care Management  05/01/2019  Phyllis Whitefield [birth year] [id number]  Telephone Assessment (2nd attempt)  RN received a voice message on yesterday after the initial outreach attempt to call this pt. The response requested another call back from this RN case manager. RN attempted the 2nd outreach call this morning however unsuccessful. RN able to leave another HIPAA approved voice message requesting a call back.  Will scheduled one additional call over the next week for pending services with [facility].   [name], RN Care Management Coordinator [place] Office [phone]

## 2019-05-03 NOTE — Patient Instructions (Addendum)
  Tests ordered today. Your results will be released to [place] (or called to you) after review.  If any changes need to be made, you will be notified at that same time.    Medications reviewed and updated.  Changes include :   none  A referral was ordered for a home health nurse.   A referral was ordered for vascular surgery/lymphema clinic  Please followup in 2-3 months

## 2019-05-03 NOTE — Progress Notes (Signed)
Subjective:    Patient ID: [name], male    DOB: 06/23/1961, 58 y.o.   MRN: [id number]  HPI The patient is here for follow up from the hospital.   Admitted 04/24/2019-04/27/2019 for fluid overload  Recommendations for Outpatient Follow-up:  1. Recommend a sleep study to be repeated as outpt  2. Needs aggressive weight loss 3. A potassium level should be checked in 1 wk 4. Needs to be encouraged to stop drinking alcohol 5. Needs referral to a lymphedema clinic  He had gained more than 60 pounds in the past 3 months.  He had significant lower extremity edema and abdominal edema.   He was seen by me the day before he went into the hospital.  He was very fluid overloaded and we discussed hospitalization.  I changed his furosemide to torsemide to see if that helped a little bit more, but there is no improvement.  He went to the ED with complaints of shortness of breath, leg edema and weight gain.  His shortness of breath was getting worse.  Lasix 40 mg twice daily was not helping.  The torsemide did not seem to help as well.  ED: Chest x-ray showed pulmonary edema.  He was not hypoxic.  BNP was 165, COVID negative, potassium 2.7.  Renal function normal.  Hemodynamically stable.  Oxygen saturation 94-98% on room air.   Fluid overload: His Lasix was increased to 60 mg 3 times daily He felt his abdomen was full of fluid-abdominal ultrasound showed no ascites, but cirrhosis Echo showed no significant change-no systolic or diastolic CHF Legs wrapped in Unna boots-he was advised to wear these until his ulcer heals and then return to Ace wraps Advised limiting fluid intake to 1400 cc/h and to weigh daily He was discharged on Lasix 40 mg twice daily and he is urinating a lot. He is taking his potassium as prescribed. He states he is weighing daily and that his weight has been stable. No change in leg swelling or fluid status.  He is still wearing Unna boots and thought someone was going to  call him regarding his wound. He does feel less short of breath than when he went into the hospital, but still feels short of breath with ambulating.  Ulcer of left foot: Fluid-filled blister left heel, which ruptured-wound care recommended Unna boots, which were placed in the hospital Pedal edema resolved with IV Lasix and legs wrapped ABIs were normal He is still wearing Unna boots. He was expecting someone to call regarding the heel ulcer. He denies any fevers or chills.  Neuropathy: Significantly decreased sensation of feet Monitor for wounds  Hypokalemia: Due to Lasix Potassium replaced Need to recheck potassium He is taking his potassium daily as prescribed   Elevated LFTs: Likely related to hepatic congestion from fluid overload in addition to alcoholic abuse Ultrasounds show cirrhosis of the liver-this is a new diagnosis LFTs did improve   Morbid obesity: Weight loss was stressed, which he understands he needs to lose the weight  Obesity hypoventilation, OSA: He did not tolerate CPAP a couple of years ago and sent back his machine It was stressed to him the importance of using his CPAP and treating his sleep apnea-needs another sleep study Pulse ox dropped into the 80s when he ambulates and when sleeping Was on continuous oxygen in the hospital  Alcohol dependence Drinking excessive alcohol daily Ativan ordered for possible withdrawal-he only had mild withdrawal symptoms He states since being home from the  hospital he has not had any alcohol. He is thinking about going into rehab for his alcohol dependence-he is trying to find the right place  Hypothyroidism: Started on Synthroid, continue  Hypertension: Lisinopril, Lasix and metoprolol. He is taking his medication daily as prescribed  Depression: Cymbalta  Prediabetes: Last A1c 5.0 Diet controlled His sugars have never reached the diabetic range It is thought that his ulcer was diabetic, but his sugars  have never been in the diabetic range.  He does have neuropathy which is likely related to his prolonged alcohol abuse.         Medications and allergies reviewed with patient and updated if appropriate.  Patient Active Problem List   Diagnosis Date Noted  . Alcoholic cirrhosis of liver without ascites ([place]) 04/27/2019  . Obesity hypoventilation syndrome ([place]) 04/27/2019  . Depression 04/25/2019  . Acute on chronic diastolic (congestive) heart failure ([place]) 04/25/2019  . Severe alcohol use disorder ([place])   . DOE (dyspnea on exertion) 04/22/2019  . Hypothyroidism 04/22/2019  . Anemia 02/06/2019  . Cold intolerance 02/06/2019  . Acute on chronic diastolic CHF (congestive heart failure) ([place]) 01/30/2019  . Avascular necrosis of hip, left ([place]) 11/02/2018  . Decreased hearing of both ears 10/30/2018  . Avascular necrosis of hip, right ([place]) 08/16/2018  . Bilateral leg edema 02/27/2018  . Marijuana abuse 02/16/2018  . Diabetic ulcer of right midfoot associated with type 2 diabetes mellitus, with necrosis of muscle ([place]) 11/16/2017  . Foot ulcer ([place]) 12/21/2016  . Bilateral carpal tunnel syndrome 12/15/2016  . Fatty liver 12/14/2016  . Hyperlipidemia 11/25/2016  . Elevated liver enzymes 11/25/2016  . Diabetes ([place]) 11/25/2016  . Hypertension 11/23/2016  . Neuropathy 11/23/2016  . Toe amputation status, right 11/23/2016  . Morbid obesity ([place]) 11/23/2016  . OSA (obstructive sleep apnea) 11/23/2016  . Other hammer toe (acquired) 11/11/2013  . Exstrophy of bladder 11/11/2013    Current Outpatient Medications on File Prior to Visit  Medication Sig Dispense Refill  . aspirin 81 MG chewable tablet Chew 1 tablet (81 mg total) by mouth 2 (two) times daily. 60 tablet 1  . calcium carbonate (TUMS - DOSED IN MG ELEMENTAL CALCIUM) 500 MG chewable tablet Chew 2 tablets by mouth at bedtime as needed for indigestion or heartburn.     . diphenhydrAMINE (BENADRYL ALLERGY) 25 MG tablet Take 25  mg by mouth at bedtime as needed for allergies.     Marland Kitchen docusate sodium (COLACE) 100 MG capsule Take 1 capsule (100 mg total) by mouth 2 (two) times daily. (Patient taking differently: Take 100 mg by mouth daily. ) 60 capsule 1  . DULoxetine (CYMBALTA) 20 MG capsule Take 1 capsule (20 mg total) by mouth daily. 90 capsule 1  . furosemide (LASIX) 40 MG tablet Take 40 mg by mouth 2 (two) times daily.    Marland Kitchen levothyroxine (SYNTHROID) 50 MCG tablet Take 1 tablet (50 mcg total) by mouth daily. 90 tablet 0  . metoprolol tartrate (LOPRESSOR) 100 MG tablet TAKE 1 TABLET (100 MG TOTAL) BY MOUTH 2 (TWO) TIMES DAILY. 180 tablet 3  . Multiple Vitamin (MULTIVITAMIN WITH MINERALS) TABS tablet Take 1 tablet by mouth daily. 30 tablet 0  . potassium chloride SA (KLOR-CON M20) 20 MEQ tablet Take 2 tablets (40 mEq total) by mouth 3 (three) times daily. 120 tablet 0  . senna (SENOKOT) 8.6 MG TABS tablet Take 1 tablet (8.6 mg total) by mouth 2 (two) times daily. (Patient taking differently: Take 1 tablet by  mouth daily as needed for mild constipation. ) 120 each 0  . thiamine 100 MG tablet Take 1 tablet (100 mg total) by mouth daily. 30 tablet 0   No current facility-administered medications on file prior to visit.     Past Medical History:  Diagnosis Date  . Alcohol dependence (HCC)   . Arthritis    hips, hands  . Bilateral carpal tunnel syndrome   . Bilateral leg edema    Chronic  . Diabetes mellitus without complication (HCC)    type 2  . Diverticulitis    portion of colon removed  . DOE (dyspnea on exertion)    occ  . Elevated liver enzymes   . Fatty liver   . GERD (gastroesophageal reflux disease)    occ  . Hammer toe   . Hip pain   . History of ventral hernia repair 2016   x2  . Hyperlipidemia    pt unsure  . Hypertension   . Marijuana abuse   . Morbid obesity (HCC)   . Neuromuscular disorder (HCC)    peripheral neuropathy feet and few fingers  . OSA (obstructive sleep apnea)    has OSA-not  used CPAP 2-3 yrs could not tolerate cpap  . PONV (postoperative nausea and vomiting)   . Toe ulcer (HCC)    left healed    Past Surgical History:  Procedure Laterality Date  . AMPUTATION TOE Left 05/25/2018   Procedure: AMPUTATION TOE left 3rd;  Surgeon: Toni ArthursHewitt, John, MD;  Location: [facility];  Service: Orthopedics;  Laterality: Left;  60min, to follow  . AMPUTATION TOE Left 06/28/2018   Procedure: Left foot revision 3rd toe amputation including 3rd metatarsal;  Surgeon: Toni ArthursHewitt, John, MD;  Location: [facility];  Service: Orthopedics;  Laterality: Left;  60min  . BICEPS TENDON REPAIR Left 2014   Partial  . COLONOSCOPY    . HERNIA REPAIR  2016   ventral  . HIP CLOSED REDUCTION Right 09/01/2018   Procedure: CLOSED REDUCTION HIP;  Surgeon: Yolonda Kidaogers, Jason Patrick, MD;  Location: [facility];  Service: Orthopedics;  Laterality: Right;  . JOINT REPLACEMENT     b/l knees   . TOE AMPUTATION Right 09/2013  . TOTAL HIP ARTHROPLASTY Right 08/16/2018   Procedure: TOTAL HIP ARTHROPLASTY ANTERIOR APPROACH;  Surgeon: Samson FredericSwinteck, Brian, MD;  Location: [facility];  Service: Orthopedics;  Laterality: Right;  . TOTAL HIP ARTHROPLASTY Left 11/02/2018   Procedure: TOTAL HIP ARTHROPLASTY ANTERIOR APPROACH;  Surgeon: Samson FredericSwinteck, Brian, MD;  Location: [facility];  Service: Orthopedics;  Laterality: Left;  . TOTAL KNEE ARTHROPLASTY     bilat    Social History   Socioeconomic History  . Marital status: Divorced    Spouse name: Not on file  . Number of children: Not on file  . Years of education: Not on file  . Highest education level: Not on file  Occupational History  . Occupation: unemployed, Press photographerfiling for disability  Social Needs  . Financial resource strain: Not on file  . Food insecurity    Worry: Not on file    Inability: Not on file  . Transportation needs    Medical: Not on file    Non-medical: Not on file  Tobacco Use  . Smoking status: Former Smoker    Packs/day: 0.25     Years: 10.00    Pack years: 2.50    Types: Cigarettes  . Smokeless tobacco: Never Used  . Tobacco comment: quit 2018  Substance and Sexual  Activity  . Alcohol use: Yes    Comment: 2-3 drinks 4-5 nights a week, liquor  . Drug use: Yes    Frequency: 1.0 times per week    Types: Marijuana    Comment: "4 times a month, maybe"  . Sexual activity: Not on file  Lifestyle  . Physical activity    Days per week: Not on file    Minutes per session: Not on file  . Stress: Not on file  Relationships  . Social Musicianconnections    Talks on phone: Not on file    Gets together: Not on file    Attends religious service: Not on file    Active member of club or organization: Not on file    Attends meetings of clubs or organizations: Not on file    Relationship status: Not on file  Other Topics Concern  . Not on file  Social History Narrative  . Not on file    Family History  Adopted: Yes  Family history unknown: Yes    Review of Systems  Constitutional: Negative for chills and fever.  Respiratory: Positive for shortness of breath. Negative for cough and wheezing.   Cardiovascular: Positive for leg swelling. Negative for chest pain and palpitations.  Gastrointestinal: Negative for abdominal pain and nausea.  Endocrine: Positive for cold intolerance.  Neurological: Negative for light-headedness and headaches.       Objective:   Vitals:   05/04/19 1313  BP: 122/80  Pulse: 70  Temp: 98.2 F (36.8 C)  SpO2: 95%   BP Readings from Last 3 Encounters:  05/04/19 122/80  04/27/19 111/65  04/23/19 (!) 144/74   Wt Readings from Last 3 Encounters:  05/04/19 (!) 338 lb 4 oz (153.4 kg)  04/27/19 (!) 349 lb 9.6 oz (158.6 kg)  04/23/19 (!) 362 lb (164.2 kg)   Body mass index is 44.63 kg/m.   Physical Exam    Constitutional: Appears well-developed and well-nourished. No distress.  HENT:  Head: Normocephalic and atraumatic.  Neck: Neck supple. No tracheal deviation present. No  thyromegaly present.  No cervical lymphadenopathy Cardiovascular: Normal rate, regular rhythm and normal heart sounds.  No murmur heard. No carotid bruit .  Wearing Unna boots.  Pitting edema still evident throughout lower extremities including posterior upper leg  Pulmonary/Chest: Effort normal and breath sounds normal. No respiratory distress. No has no wheezes. No rales. Abdomen: Obese, firm, nontender Skin: Skin is warm and dry. Not diaphoretic.  Chronic skin changes bilateral lower extremities secondary to edema Psychiatric: Slightly depressed mood and affect. Behavior is normal.      Assessment & Plan:    See Problem List for Assessment and Plan of chronic medical problems.

## 2019-05-04 ENCOUNTER — Telehealth: Payer: Self-pay | Admitting: Internal Medicine

## 2019-05-04 ENCOUNTER — Other Ambulatory Visit (INDEPENDENT_AMBULATORY_CARE_PROVIDER_SITE_OTHER): Payer: Medicare Other

## 2019-05-04 ENCOUNTER — Ambulatory Visit (INDEPENDENT_AMBULATORY_CARE_PROVIDER_SITE_OTHER): Payer: Medicare Other | Admitting: Internal Medicine

## 2019-05-04 ENCOUNTER — Other Ambulatory Visit: Payer: Self-pay

## 2019-05-04 ENCOUNTER — Encounter: Payer: Self-pay | Admitting: Internal Medicine

## 2019-05-04 VITALS — BP 122/80 | HR 70 | Temp 98.2°F | Ht 73.0 in | Wt 338.2 lb

## 2019-05-04 DIAGNOSIS — F1020 Alcohol dependence, uncomplicated: Secondary | ICD-10-CM

## 2019-05-04 DIAGNOSIS — I10 Essential (primary) hypertension: Secondary | ICD-10-CM

## 2019-05-04 DIAGNOSIS — G629 Polyneuropathy, unspecified: Secondary | ICD-10-CM

## 2019-05-04 DIAGNOSIS — E1142 Type 2 diabetes mellitus with diabetic polyneuropathy: Secondary | ICD-10-CM | POA: Diagnosis not present

## 2019-05-04 DIAGNOSIS — R7303 Prediabetes: Secondary | ICD-10-CM | POA: Diagnosis not present

## 2019-05-04 DIAGNOSIS — I890 Lymphedema, not elsewhere classified: Secondary | ICD-10-CM | POA: Diagnosis not present

## 2019-05-04 DIAGNOSIS — Z23 Encounter for immunization: Secondary | ICD-10-CM

## 2019-05-04 DIAGNOSIS — E039 Hypothyroidism, unspecified: Secondary | ICD-10-CM

## 2019-05-04 DIAGNOSIS — L97429 Non-pressure chronic ulcer of left heel and midfoot with unspecified severity: Secondary | ICD-10-CM

## 2019-05-04 DIAGNOSIS — Z1159 Encounter for screening for other viral diseases: Secondary | ICD-10-CM

## 2019-05-04 DIAGNOSIS — K7030 Alcoholic cirrhosis of liver without ascites: Secondary | ICD-10-CM

## 2019-05-04 DIAGNOSIS — F3289 Other specified depressive episodes: Secondary | ICD-10-CM

## 2019-05-04 LAB — COMPREHENSIVE METABOLIC PANEL
ALT: 15 U/L (ref 0–53)
AST: 45 U/L — ABNORMAL HIGH (ref 0–37)
Albumin: 3.2 g/dL — ABNORMAL LOW (ref 3.5–5.2)
Alkaline Phosphatase: 203 U/L — ABNORMAL HIGH (ref 39–117)
BUN: 9 mg/dL (ref 6–23)
CO2: 27 mEq/L (ref 19–32)
Calcium: 9.2 mg/dL (ref 8.4–10.5)
Chloride: 99 mEq/L (ref 96–112)
Creatinine, Ser: 0.72 mg/dL (ref 0.40–1.50)
GFR: 112.21 mL/min (ref >60.00)
Glucose, Bld: 105 mg/dL — ABNORMAL HIGH (ref 70–99)
Potassium: 3.9 mEq/L (ref 3.5–5.1)
Sodium: 136 mEq/L (ref 135–145)
Total Bilirubin: 2.2 mg/dL — ABNORMAL HIGH (ref 0.2–1.2)
Total Protein: 8.3 g/dL (ref 6.0–8.3)

## 2019-05-04 NOTE — Assessment & Plan Note (Signed)
Ulcer left heel Currently on Unna boots Will request home health nurse to come and continue Unna boots until ulcer healed and then transition to Ace wrap's Also refer to vascular/lymphedema clinic

## 2019-05-04 NOTE — Assessment & Plan Note (Signed)
Continue Cymbalta.

## 2019-05-04 NOTE — Assessment & Plan Note (Signed)
Has not had any alcohol since he left the hospital Looking to go into rehab to help prevent further alcohol use We will check hepatitis C antibody, hepatitis B antibody/antigen Should start immunization for hepatitis A/B Stressed the importance of weight loss

## 2019-05-04 NOTE — Assessment & Plan Note (Signed)
Neuropathy likely related to alcohol abuse, he was told it was related to his knee surgeries which could be contributing And high risk of wounds-has been referred to podiatry and should be following with him routinely

## 2019-05-04 NOTE — Assessment & Plan Note (Signed)
Sugars have never reached the diabetic range We will continue to monitor A1c Not on any medication

## 2019-05-04 NOTE — Assessment & Plan Note (Signed)
Blood pressure well-controlled Continue current medications CMP 

## 2019-05-04 NOTE — Telephone Encounter (Signed)
Spoke to [employer]. She recommended - I would wait. He should clean the area that are needed for now and wait until home health comes out to change bandages etc. Called patient and informed.   Copied from [place]. Topic: General - Other >> May 04, 2019  4:27 PM Mcneil, Ja-Kwan wrote: Reason for CRM: Pt stated he would like to know if it is ok for him to remove the wraps that are on his legs so he can take a shower. Pt requests call back.

## 2019-05-04 NOTE — Assessment & Plan Note (Signed)
Continue levothyroxine Will check TSH at his next appointment

## 2019-05-04 NOTE — Assessment & Plan Note (Signed)
He has not drank any alcohol since leaving the hospital He is looking to go into rehab to prevent further use of alcohol He did very well in the hospital but he does have cirrhosis

## 2019-05-04 NOTE — Assessment & Plan Note (Signed)
Has lymphedema And Unna boots Will request home health nurse to help with Unna boots until heel ulcer is healed-then can transition to Ace wrap's We will also refer to vascular/lymphedema clinic Will adjust Lasix renal CMP

## 2019-05-06 ENCOUNTER — Other Ambulatory Visit: Payer: Self-pay | Admitting: Internal Medicine

## 2019-05-06 ENCOUNTER — Encounter: Payer: Self-pay | Admitting: Internal Medicine

## 2019-05-07 ENCOUNTER — Encounter: Payer: Self-pay | Admitting: Internal Medicine

## 2019-05-07 ENCOUNTER — Other Ambulatory Visit: Payer: Self-pay | Admitting: *Deleted

## 2019-05-07 LAB — HEPATITIS B SURFACE ANTIGEN: Hepatitis B Surface Ag: NONREACTIVE

## 2019-05-07 LAB — HEPATITIS C ANTIBODY
Hepatitis C Ab: NONREACTIVE
SIGNAL TO CUT-OFF: 0.12 (ref <1.00)

## 2019-05-07 LAB — HEPATITIS B SURFACE ANTIBODY,QUALITATIVE: Hep B S Ab: NONREACTIVE

## 2019-05-07 NOTE — Patient Outreach (Signed)
[facility]) Care Management  05/07/2019  [name] February 11, 1961 [id number]    RN attempted the 3rd outreach to pt however remains unsuccessful. RN able to leave a HIPAA approved voice message requesting a call back. Note no response to previous outreach letter earlier in this month.   Will close case at the end of this week per policy.  [name], RN Care Management Coordinator [place] Office [phone]

## 2019-05-07 NOTE — Telephone Encounter (Signed)
Please advise on refill and sig 

## 2019-05-08 ENCOUNTER — Encounter: Payer: Self-pay | Admitting: Internal Medicine

## 2019-05-09 DIAGNOSIS — I509 Heart failure, unspecified: Secondary | ICD-10-CM | POA: Diagnosis not present

## 2019-05-09 DIAGNOSIS — G4733 Obstructive sleep apnea (adult) (pediatric): Secondary | ICD-10-CM | POA: Diagnosis not present

## 2019-05-11 ENCOUNTER — Other Ambulatory Visit: Payer: Self-pay | Admitting: *Deleted

## 2019-05-11 NOTE — Patient Outreach (Signed)
[facility]) Care Management  05/11/2019  [name] 06-08-1961 [id number]   Case Closure  Several attempts to reach this pt however unsuccessful. No response to the outreach letters. Per police will close this case and notify the provider of pt's disposition with [facility] services.  [name], RN Care Management Coordinator [place] Office [phone]

## 2019-05-14 NOTE — Telephone Encounter (Signed)
It was actually sent to [facility] and they are going to attempt to contact pt again.

## 2019-05-16 ENCOUNTER — Encounter: Payer: Self-pay | Admitting: Internal Medicine

## 2019-05-19 ENCOUNTER — Other Ambulatory Visit: Payer: Self-pay | Admitting: Internal Medicine

## 2019-05-21 DIAGNOSIS — L97221 Non-pressure chronic ulcer of left calf limited to breakdown of skin: Secondary | ICD-10-CM | POA: Diagnosis not present

## 2019-05-21 DIAGNOSIS — L97211 Non-pressure chronic ulcer of right calf limited to breakdown of skin: Secondary | ICD-10-CM | POA: Diagnosis not present

## 2019-05-21 DIAGNOSIS — I890 Lymphedema, not elsewhere classified: Secondary | ICD-10-CM | POA: Diagnosis not present

## 2019-05-25 DIAGNOSIS — L97211 Non-pressure chronic ulcer of right calf limited to breakdown of skin: Secondary | ICD-10-CM | POA: Diagnosis not present

## 2019-05-25 DIAGNOSIS — L97221 Non-pressure chronic ulcer of left calf limited to breakdown of skin: Secondary | ICD-10-CM | POA: Diagnosis not present

## 2019-05-29 NOTE — Telephone Encounter (Signed)
Do you want pt to stay on furosemide over the the torsemide?

## 2019-05-29 NOTE — Telephone Encounter (Signed)
Pt called to follow up on Torsemide 20 MG / Vitamin B 100MG . He would like to know if Dr. Quay Burow wants him to continue taking these medications or not. Please advise.

## 2019-05-29 NOTE — Telephone Encounter (Signed)
He should stay on the furosemide.  If the swelling is not controlled then the medication can be adjusted.  He should also continue the B vitamin.

## 2019-05-30 DIAGNOSIS — L97221 Non-pressure chronic ulcer of left calf limited to breakdown of skin: Secondary | ICD-10-CM | POA: Diagnosis not present

## 2019-05-30 DIAGNOSIS — L97211 Non-pressure chronic ulcer of right calf limited to breakdown of skin: Secondary | ICD-10-CM | POA: Diagnosis not present

## 2019-05-30 MED ORDER — FUROSEMIDE 40 MG PO TABS: 40.0000 mg | ORAL_TABLET | Freq: Two times a day (BID) | ORAL | 1 refills | Status: DC

## 2019-05-30 MED ORDER — THIAMINE HCL 100 MG PO TABS: 100.0000 mg | ORAL_TABLET | Freq: Every day | ORAL | 1 refills | Status: DC

## 2019-05-30 NOTE — Telephone Encounter (Signed)
Pt aware of response below. Resent meds to pharmacy. Pt asked if something can be given to him to help with sleep. He has stopped drinking and he is up all night. Please advise. Ps. Legs are doing much better since going to the vein and vascular clinic

## 2019-05-30 NOTE — Telephone Encounter (Signed)
LVM for pt to call back in regards.  

## 2019-05-30 NOTE — Addendum Note (Signed)
Addended by: Delice Bison E on: 05/30/2019 01:16 PM   Modules accepted: Orders

## 2019-05-31 MED ORDER — TRAZODONE HCL 50 MG PO TABS: 50.0000 mg | ORAL_TABLET | Freq: Every evening | ORAL | 1 refills | Status: DC | PRN

## 2019-05-31 NOTE — Addendum Note (Signed)
Addended by: Binnie Rail on: 05/31/2019 07:41 AM   Modules accepted: Orders

## 2019-05-31 NOTE — Telephone Encounter (Signed)
Left detailed message informing pt of below.  

## 2019-05-31 NOTE — Telephone Encounter (Signed)
Most sleep medication have the potential to negatively affect memory when used long term.  The safest sleep medication is trazodone - we can try that and if it does not help we can consider other options.   [facility]

## 2019-06-04 DIAGNOSIS — L97221 Non-pressure chronic ulcer of left calf limited to breakdown of skin: Secondary | ICD-10-CM | POA: Diagnosis not present

## 2019-06-04 DIAGNOSIS — L97211 Non-pressure chronic ulcer of right calf limited to breakdown of skin: Secondary | ICD-10-CM | POA: Diagnosis not present

## 2019-06-08 ENCOUNTER — Encounter: Payer: Self-pay | Admitting: Internal Medicine

## 2019-06-08 DIAGNOSIS — L97221 Non-pressure chronic ulcer of left calf limited to breakdown of skin: Secondary | ICD-10-CM | POA: Diagnosis not present

## 2019-06-08 DIAGNOSIS — L97211 Non-pressure chronic ulcer of right calf limited to breakdown of skin: Secondary | ICD-10-CM | POA: Diagnosis not present

## 2019-06-09 DIAGNOSIS — G4733 Obstructive sleep apnea (adult) (pediatric): Secondary | ICD-10-CM | POA: Diagnosis not present

## 2019-06-09 DIAGNOSIS — I509 Heart failure, unspecified: Secondary | ICD-10-CM | POA: Diagnosis not present

## 2019-06-15 DIAGNOSIS — L97211 Non-pressure chronic ulcer of right calf limited to breakdown of skin: Secondary | ICD-10-CM | POA: Diagnosis not present

## 2019-06-15 DIAGNOSIS — I8312 Varicose veins of left lower extremity with inflammation: Secondary | ICD-10-CM | POA: Diagnosis not present

## 2019-06-15 DIAGNOSIS — L97221 Non-pressure chronic ulcer of left calf limited to breakdown of skin: Secondary | ICD-10-CM | POA: Diagnosis not present

## 2019-06-15 DIAGNOSIS — I8311 Varicose veins of right lower extremity with inflammation: Secondary | ICD-10-CM | POA: Diagnosis not present

## 2019-06-25 ENCOUNTER — Other Ambulatory Visit: Payer: Self-pay | Admitting: Internal Medicine

## 2019-07-02 ENCOUNTER — Telehealth: Payer: Self-pay | Admitting: Internal Medicine

## 2019-07-02 ENCOUNTER — Other Ambulatory Visit: Payer: Self-pay | Admitting: Internal Medicine

## 2019-07-02 MED ORDER — FUROSEMIDE 40 MG PO TABS: 40.0000 mg | ORAL_TABLET | Freq: Two times a day (BID) | ORAL | 0 refills | Status: DC

## 2019-07-02 NOTE — Telephone Encounter (Signed)
Requested Prescriptions  Pending Prescriptions Disp Refills  . furosemide (LASIX) 40 MG tablet 180 tablet 0    Sig: Take 1 tablet (40 mg total) by mouth 2 (two) times daily.     Cardiovascular:  Diuretics - Loop Passed - 07/02/2019  4:20 PM      Passed - K in normal range and within 360 days    Potassium  Date Value Ref Range Status  05/04/2019 3.9 3.5 - 5.1 mEq/L Final         Passed - Ca in normal range and within 360 days    Calcium  Date Value Ref Range Status  05/04/2019 9.2 8.4 - 10.5 mg/dL Final         Passed - Na in normal range and within 360 days    Sodium  Date Value Ref Range Status  05/04/2019 136 135 - 145 mEq/L Final         Passed - Cr in normal range and within 360 days    Creatinine, Ser  Date Value Ref Range Status  05/04/2019 0.72 0.40 - 1.50 mg/dL Final         Passed - Last BP in normal range    BP Readings from Last 1 Encounters:  05/04/19 122/80         Passed - Valid encounter within last 6 months    Recent Outpatient Visits          1 month ago Type 2 diabetes mellitus with diabetic polyneuropathy, without long-term current use of insulin ([place])   [place], Claudina Lick, MD   2 months ago Bilateral leg edema   [place], Claudina Lick, MD   4 months ago Type 2 diabetes mellitus with diabetic polyneuropathy, without long-term current use of insulin ([place])   [place], Claudina Lick, MD   5 months ago Bilateral leg edema   [place], Claudina Lick, MD   8 months ago Preop examination   [facility] [place], Claudina Lick, MD

## 2019-07-02 NOTE — Telephone Encounter (Signed)
Copied from [place] [id number]. Topic: Quick Communication - Rx Refill/Question >> Jul 02, 2019  4:11 PM Izola Price, [place] A wrote: Medication: furosemide (LASIX) 40 MG tablet (Patient stated that his has a weeks supply left and no refills)  Has the patient contacted their pharmacy? Yes (Agent: If no, request that the patient contact the pharmacy for the refill.) (Agent: If yes, when and what did the pharmacy advise?)Contact PCP  Preferred Pharmacy (with phone number or street name): [id number] [address], [place] [phone] (Phone) [phone] (Fax)    Agent: Please be advised that RX refills may take up to 3 business days. We ask that you follow-up with your pharmacy.

## 2019-07-02 NOTE — Telephone Encounter (Signed)
Copied from [place] [id number]. Topic: General - Other >> Jul 02, 2019  4:14 PM Rainey Pines A wrote: Patient would like a callback from nurse in regards to him continuing to take the vitamin b1

## 2019-07-03 DIAGNOSIS — L97221 Non-pressure chronic ulcer of left calf limited to breakdown of skin: Secondary | ICD-10-CM | POA: Diagnosis not present

## 2019-07-03 NOTE — Telephone Encounter (Signed)
He should ideally take it for a few more months.  With the amount of alcohol he was drinking it causes B1 deficiency and it will take a few months to replete this.

## 2019-07-03 NOTE — Telephone Encounter (Signed)
Do you want pt to continue taking the B1?

## 2019-07-04 NOTE — Telephone Encounter (Signed)
LVM letting pt know.  

## 2019-07-09 ENCOUNTER — Other Ambulatory Visit: Payer: Self-pay | Admitting: Internal Medicine

## 2019-07-09 NOTE — Telephone Encounter (Signed)
Metformin was d/c'd 04/23/19. Ok to Rf?

## 2019-07-10 ENCOUNTER — Other Ambulatory Visit: Payer: Self-pay | Admitting: Internal Medicine

## 2019-07-10 DIAGNOSIS — I509 Heart failure, unspecified: Secondary | ICD-10-CM | POA: Diagnosis not present

## 2019-07-10 DIAGNOSIS — G4733 Obstructive sleep apnea (adult) (pediatric): Secondary | ICD-10-CM | POA: Diagnosis not present

## 2019-07-11 DIAGNOSIS — L97221 Non-pressure chronic ulcer of left calf limited to breakdown of skin: Secondary | ICD-10-CM | POA: Diagnosis not present

## 2019-07-13 ENCOUNTER — Other Ambulatory Visit: Payer: Self-pay | Admitting: Internal Medicine

## 2019-07-13 ENCOUNTER — Telehealth: Payer: Self-pay | Admitting: Internal Medicine

## 2019-07-13 MED ORDER — POTASSIUM CHLORIDE CRYS ER 20 MEQ PO TBCR: 40.0000 meq | EXTENDED_RELEASE_TABLET | Freq: Three times a day (TID) | ORAL | 0 refills | Status: DC

## 2019-07-13 NOTE — Telephone Encounter (Signed)
Pt is requesting a refill on potassium chloride SA (KLOR-CON M20) 20 MEQ tablet    Pharmacy:  [id number] [address], [place] (Phone) [phone] (Fax)

## 2019-07-13 NOTE — Telephone Encounter (Signed)
Pt says that he was recently Dx with Neuropathy and edema, pt says that his hands feel cold, pt would like to know if there is something that PCP could Rx to help?    CB: [phone]

## 2019-07-13 NOTE — Telephone Encounter (Signed)
Requested Prescriptions  Pending Prescriptions Disp Refills  . potassium chloride SA (KLOR-CON M20) 20 MEQ tablet 180 tablet 0    Sig: Take 2 tablets (40 mEq total) by mouth 3 (three) times daily.     Endocrinology:  Minerals - Potassium Supplementation Passed - 07/13/2019  4:02 PM      Passed - K in normal range and within 360 days    Potassium  Date Value Ref Range Status  05/04/2019 3.9 3.5 - 5.1 mEq/L Final         Passed - Cr in normal range and within 360 days    Creatinine, Ser  Date Value Ref Range Status  05/04/2019 0.72 0.40 - 1.50 mg/dL Final         Passed - Valid encounter within last 12 months    Recent Outpatient Visits          2 months ago Type 2 diabetes mellitus with diabetic polyneuropathy, without long-term current use of insulin ([place])   [place], MD   2 months ago Bilateral leg edema   [place], Claudina Lick, MD   5 months ago Type 2 diabetes mellitus with diabetic polyneuropathy, without long-term current use of insulin ([place])   [place], MD   5 months ago Bilateral leg edema   [place], Claudina Lick, MD   8 months ago Preop examination   [place], Claudina Lick, MD

## 2019-07-15 NOTE — Telephone Encounter (Signed)
Needs an appt

## 2019-07-16 NOTE — Telephone Encounter (Signed)
Left pt vm to call back to schedule. 

## 2019-07-16 NOTE — Telephone Encounter (Signed)
Will you schedule visit or pt please. Thank you!

## 2019-07-17 DIAGNOSIS — L97221 Non-pressure chronic ulcer of left calf limited to breakdown of skin: Secondary | ICD-10-CM | POA: Diagnosis not present

## 2019-07-17 NOTE — Telephone Encounter (Signed)
Sent message through mychart

## 2019-07-24 DIAGNOSIS — L97221 Non-pressure chronic ulcer of left calf limited to breakdown of skin: Secondary | ICD-10-CM | POA: Diagnosis not present

## 2019-07-30 ENCOUNTER — Encounter: Payer: Self-pay | Admitting: Internal Medicine

## 2019-07-31 ENCOUNTER — Other Ambulatory Visit: Payer: Self-pay

## 2019-07-31 DIAGNOSIS — L97221 Non-pressure chronic ulcer of left calf limited to breakdown of skin: Secondary | ICD-10-CM | POA: Diagnosis not present

## 2019-07-31 MED ORDER — DULOXETINE HCL 20 MG PO CPEP: 20.0000 mg | ORAL_CAPSULE | Freq: Every day | ORAL | 0 refills | Status: DC

## 2019-08-05 ENCOUNTER — Other Ambulatory Visit: Payer: Self-pay | Admitting: Internal Medicine

## 2019-08-07 DIAGNOSIS — L97221 Non-pressure chronic ulcer of left calf limited to breakdown of skin: Secondary | ICD-10-CM | POA: Diagnosis not present

## 2019-08-09 DIAGNOSIS — I509 Heart failure, unspecified: Secondary | ICD-10-CM | POA: Diagnosis not present

## 2019-08-09 DIAGNOSIS — G4733 Obstructive sleep apnea (adult) (pediatric): Secondary | ICD-10-CM | POA: Diagnosis not present

## 2019-08-14 DIAGNOSIS — L97221 Non-pressure chronic ulcer of left calf limited to breakdown of skin: Secondary | ICD-10-CM | POA: Diagnosis not present

## 2019-08-21 DIAGNOSIS — L97221 Non-pressure chronic ulcer of left calf limited to breakdown of skin: Secondary | ICD-10-CM | POA: Diagnosis not present

## 2019-08-28 DIAGNOSIS — L97221 Non-pressure chronic ulcer of left calf limited to breakdown of skin: Secondary | ICD-10-CM | POA: Diagnosis not present

## 2019-09-02 ENCOUNTER — Other Ambulatory Visit: Payer: Self-pay | Admitting: Internal Medicine

## 2019-09-07 ENCOUNTER — Other Ambulatory Visit: Payer: Self-pay | Admitting: Internal Medicine

## 2019-09-07 ENCOUNTER — Telehealth: Payer: Self-pay

## 2019-09-07 MED ORDER — FUROSEMIDE 40 MG PO TABS: 40.0000 mg | ORAL_TABLET | Freq: Two times a day (BID) | ORAL | 0 refills | Status: DC

## 2019-09-07 NOTE — Telephone Encounter (Signed)
Copied from [place] [phone]. Topic: General - Other >> Sep 07, 2019  1:44 PM Leward Quan A wrote: Reason for CRM: Patient called to say that he was not aware that Dr Quay Burow had changed his Rx for furosemide (LASIX) 40 MG tablet  and was still taking original dose then he also spilled the bottle and pills went down the sink. Patient say that he have 5 pills left and is asking Dr Quay Burow to send an Rx to [id number] [place], [place] [phone] (Phone) [phone] (Fax) so that he can have enough pills maybe about 30 pills that will last till his refill is due on 10/01/2019. Please call patient with questions. Ph# [phone]

## 2019-09-07 NOTE — Telephone Encounter (Signed)
Pt needs follow up appointment. Sending response through [employer].

## 2019-09-09 DIAGNOSIS — G4733 Obstructive sleep apnea (adult) (pediatric): Secondary | ICD-10-CM | POA: Diagnosis not present

## 2019-09-09 DIAGNOSIS — I509 Heart failure, unspecified: Secondary | ICD-10-CM | POA: Diagnosis not present

## 2019-09-30 ENCOUNTER — Other Ambulatory Visit: Payer: Self-pay | Admitting: Internal Medicine

## 2019-10-01 ENCOUNTER — Other Ambulatory Visit: Payer: Self-pay | Admitting: Internal Medicine

## 2019-10-02 ENCOUNTER — Other Ambulatory Visit: Payer: Self-pay | Admitting: Internal Medicine

## 2019-10-06 ENCOUNTER — Other Ambulatory Visit: Payer: Self-pay | Admitting: Internal Medicine

## 2019-10-14 ENCOUNTER — Encounter: Payer: Self-pay | Admitting: Internal Medicine

## 2019-10-15 ENCOUNTER — Inpatient Hospital Stay (HOSPITAL_COMMUNITY)
Admission: EM | Admit: 2019-10-15 | Discharge: 2019-10-18 | DRG: 872 | Disposition: A | Discharge status: Home / Self Care | Payer: Medicare Other | Attending: Internal Medicine | Admitting: Internal Medicine

## 2019-10-15 ENCOUNTER — Encounter (HOSPITAL_COMMUNITY): Payer: Self-pay | Admitting: Emergency Medicine

## 2019-10-15 ENCOUNTER — Inpatient Hospital Stay (HOSPITAL_COMMUNITY): Payer: Medicare Other

## 2019-10-15 ENCOUNTER — Other Ambulatory Visit: Payer: Self-pay

## 2019-10-15 DIAGNOSIS — E662 Morbid (severe) obesity with alveolar hypoventilation: Secondary | ICD-10-CM | POA: Diagnosis present

## 2019-10-15 DIAGNOSIS — L97519 Non-pressure chronic ulcer of other part of right foot with unspecified severity: Secondary | ICD-10-CM | POA: Diagnosis present

## 2019-10-15 DIAGNOSIS — F1020 Alcohol dependence, uncomplicated: Secondary | ICD-10-CM | POA: Diagnosis present

## 2019-10-15 DIAGNOSIS — E785 Hyperlipidemia, unspecified: Secondary | ICD-10-CM | POA: Diagnosis present

## 2019-10-15 DIAGNOSIS — R2241 Localized swelling, mass and lump, right lower limb: Secondary | ICD-10-CM

## 2019-10-15 DIAGNOSIS — I10 Essential (primary) hypertension: Secondary | ICD-10-CM | POA: Diagnosis not present

## 2019-10-15 DIAGNOSIS — L97509 Non-pressure chronic ulcer of other part of unspecified foot with unspecified severity: Secondary | ICD-10-CM

## 2019-10-15 DIAGNOSIS — E782 Mixed hyperlipidemia: Secondary | ICD-10-CM

## 2019-10-15 DIAGNOSIS — L03115 Cellulitis of right lower limb: Secondary | ICD-10-CM | POA: Diagnosis present

## 2019-10-15 DIAGNOSIS — Z89422 Acquired absence of other left toe(s): Secondary | ICD-10-CM

## 2019-10-15 DIAGNOSIS — R600 Localized edema: Secondary | ICD-10-CM | POA: Diagnosis not present

## 2019-10-15 DIAGNOSIS — K7030 Alcoholic cirrhosis of liver without ascites: Secondary | ICD-10-CM

## 2019-10-15 DIAGNOSIS — M869 Osteomyelitis, unspecified: Secondary | ICD-10-CM | POA: Diagnosis present

## 2019-10-15 DIAGNOSIS — F1210 Cannabis abuse, uncomplicated: Secondary | ICD-10-CM | POA: Diagnosis present

## 2019-10-15 DIAGNOSIS — K219 Gastro-esophageal reflux disease without esophagitis: Secondary | ICD-10-CM | POA: Diagnosis present

## 2019-10-15 DIAGNOSIS — K760 Fatty (change of) liver, not elsewhere classified: Secondary | ICD-10-CM | POA: Diagnosis present

## 2019-10-15 DIAGNOSIS — Z79899 Other long term (current) drug therapy: Secondary | ICD-10-CM | POA: Diagnosis not present

## 2019-10-15 DIAGNOSIS — M879 Osteonecrosis, unspecified: Secondary | ICD-10-CM | POA: Diagnosis present

## 2019-10-15 DIAGNOSIS — Z79890 Hormone replacement therapy (postmenopausal): Secondary | ICD-10-CM | POA: Diagnosis not present

## 2019-10-15 DIAGNOSIS — E871 Hypo-osmolality and hyponatremia: Secondary | ICD-10-CM | POA: Diagnosis present

## 2019-10-15 DIAGNOSIS — L03119 Cellulitis of unspecified part of limb: Secondary | ICD-10-CM | POA: Diagnosis not present

## 2019-10-15 DIAGNOSIS — Z87891 Personal history of nicotine dependence: Secondary | ICD-10-CM

## 2019-10-15 DIAGNOSIS — L0390 Cellulitis, unspecified: Secondary | ICD-10-CM

## 2019-10-15 DIAGNOSIS — E11621 Type 2 diabetes mellitus with foot ulcer: Secondary | ICD-10-CM | POA: Diagnosis present

## 2019-10-15 DIAGNOSIS — G4733 Obstructive sleep apnea (adult) (pediatric): Secondary | ICD-10-CM | POA: Diagnosis not present

## 2019-10-15 DIAGNOSIS — I5032 Chronic diastolic (congestive) heart failure: Secondary | ICD-10-CM | POA: Diagnosis present

## 2019-10-15 DIAGNOSIS — I110 Hypertensive heart disease with heart failure: Secondary | ICD-10-CM | POA: Diagnosis present

## 2019-10-15 DIAGNOSIS — L02415 Cutaneous abscess of right lower limb: Secondary | ICD-10-CM | POA: Diagnosis present

## 2019-10-15 DIAGNOSIS — Z20828 Contact with and (suspected) exposure to other viral communicable diseases: Secondary | ICD-10-CM | POA: Diagnosis present

## 2019-10-15 DIAGNOSIS — G629 Polyneuropathy, unspecified: Secondary | ICD-10-CM

## 2019-10-15 DIAGNOSIS — L97502 Non-pressure chronic ulcer of other part of unspecified foot with fat layer exposed: Secondary | ICD-10-CM

## 2019-10-15 DIAGNOSIS — Z96653 Presence of artificial knee joint, bilateral: Secondary | ICD-10-CM | POA: Diagnosis present

## 2019-10-15 DIAGNOSIS — A419 Sepsis, unspecified organism: Principal | ICD-10-CM | POA: Diagnosis present

## 2019-10-15 DIAGNOSIS — E1140 Type 2 diabetes mellitus with diabetic neuropathy, unspecified: Secondary | ICD-10-CM | POA: Diagnosis present

## 2019-10-15 DIAGNOSIS — L089 Local infection of the skin and subcutaneous tissue, unspecified: Secondary | ICD-10-CM | POA: Diagnosis not present

## 2019-10-15 DIAGNOSIS — Z96643 Presence of artificial hip joint, bilateral: Secondary | ICD-10-CM | POA: Diagnosis present

## 2019-10-15 DIAGNOSIS — S91339A Puncture wound without foreign body, unspecified foot, initial encounter: Secondary | ICD-10-CM

## 2019-10-15 DIAGNOSIS — E11628 Type 2 diabetes mellitus with other skin complications: Secondary | ICD-10-CM | POA: Diagnosis not present

## 2019-10-15 DIAGNOSIS — E1169 Type 2 diabetes mellitus with other specified complication: Secondary | ICD-10-CM | POA: Diagnosis present

## 2019-10-15 DIAGNOSIS — D638 Anemia in other chronic diseases classified elsewhere: Secondary | ICD-10-CM | POA: Diagnosis present

## 2019-10-15 DIAGNOSIS — M7989 Other specified soft tissue disorders: Secondary | ICD-10-CM | POA: Diagnosis present

## 2019-10-15 DIAGNOSIS — E039 Hypothyroidism, unspecified: Secondary | ICD-10-CM | POA: Diagnosis present

## 2019-10-15 DIAGNOSIS — I872 Venous insufficiency (chronic) (peripheral): Secondary | ICD-10-CM | POA: Diagnosis present

## 2019-10-15 DIAGNOSIS — Z7982 Long term (current) use of aspirin: Secondary | ICD-10-CM | POA: Diagnosis not present

## 2019-10-15 DIAGNOSIS — L97529 Non-pressure chronic ulcer of other part of left foot with unspecified severity: Secondary | ICD-10-CM | POA: Diagnosis present

## 2019-10-15 DIAGNOSIS — I5042 Chronic combined systolic (congestive) and diastolic (congestive) heart failure: Secondary | ICD-10-CM

## 2019-10-15 DIAGNOSIS — R6510 Systemic inflammatory response syndrome (SIRS) of non-infectious origin without acute organ dysfunction: Secondary | ICD-10-CM

## 2019-10-15 LAB — CBC WITH DIFFERENTIAL/PLATELET
Abs Immature Granulocytes: 0.05 10*3/uL (ref 0.00–0.07)
Basophils Absolute: 0.1 10*3/uL (ref 0.0–0.1)
Basophils Relative: 0 %
Eosinophils Absolute: 0.1 10*3/uL (ref 0.0–0.5)
Eosinophils Relative: 1 %
HCT: 36.2 % — ABNORMAL LOW (ref 39.0–52.0)
Hemoglobin: 11.9 g/dL — ABNORMAL LOW (ref 13.0–17.0)
Immature Granulocytes: 0 %
Lymphocytes Relative: 12 %
Lymphs Abs: 1.4 10*3/uL (ref 0.7–4.0)
MCH: 30.4 pg (ref 26.0–34.0)
MCHC: 32.9 g/dL (ref 30.0–36.0)
MCV: 92.6 fL (ref 80.0–100.0)
Monocytes Absolute: 0.7 10*3/uL (ref 0.1–1.0)
Monocytes Relative: 5 %
Neutro Abs: 9.9 10*3/uL — ABNORMAL HIGH (ref 1.7–7.7)
Neutrophils Relative %: 82 %
Platelets: 266 10*3/uL (ref 150–400)
RBC: 3.91 MIL/uL — ABNORMAL LOW (ref 4.22–5.81)
RDW: 14.3 % (ref 11.5–15.5)
WBC: 12.1 10*3/uL — ABNORMAL HIGH (ref 4.0–10.5)
nRBC: 0 % (ref 0.0–0.2)

## 2019-10-15 LAB — COMPREHENSIVE METABOLIC PANEL
ALT: 23 U/L (ref 0–44)
AST: 24 U/L (ref 15–41)
Albumin: 2.9 g/dL — ABNORMAL LOW (ref 3.5–5.0)
Alkaline Phosphatase: 182 U/L — ABNORMAL HIGH (ref 38–126)
Anion gap: 13 (ref 5–15)
BUN: 11 mg/dL (ref 6–20)
CO2: 25 mmol/L (ref 22–32)
Calcium: 8.8 mg/dL — ABNORMAL LOW (ref 8.9–10.3)
Chloride: 95 mmol/L — ABNORMAL LOW (ref 98–111)
Creatinine, Ser: 0.79 mg/dL (ref 0.61–1.24)
GFR calc Af Amer: >60 mL/min (ref >60)
GFR calc non Af Amer: >60 mL/min (ref >60)
Glucose, Bld: 123 mg/dL — ABNORMAL HIGH (ref 70–99)
Potassium: 3.5 mmol/L (ref 3.5–5.1)
Sodium: 133 mmol/L — ABNORMAL LOW (ref 135–145)
Total Bilirubin: 1.4 mg/dL — ABNORMAL HIGH (ref 0.3–1.2)
Total Protein: 8.2 g/dL — ABNORMAL HIGH (ref 6.5–8.1)

## 2019-10-15 LAB — SARS CORONAVIRUS 2 (TAT 6-24 HRS): SARS Coronavirus 2: NEGATIVE

## 2019-10-15 LAB — LACTIC ACID, PLASMA: Lactic Acid, Venous: 1.4 mmol/L (ref 0.5–1.9)

## 2019-10-15 MED ORDER — SODIUM CHLORIDE 0.9 % IV SOLN
2.0000 g | Freq: Once | INTRAVENOUS | Status: AC
  Administered 2019-10-15: 2 g via INTRAVENOUS
  Filled 2019-10-15: qty 20

## 2019-10-15 MED ORDER — FUROSEMIDE 40 MG PO TABS
40.0000 mg | ORAL_TABLET | Freq: Two times a day (BID) | ORAL | Status: DC
  Administered 2019-10-15: 40 mg via ORAL
  Filled 2019-10-15: qty 1

## 2019-10-15 MED ORDER — ONDANSETRON HCL 4 MG PO TABS: 4.0000 mg | ORAL_TABLET | Freq: Four times a day (QID) | ORAL | Status: DC | PRN

## 2019-10-15 MED ORDER — VANCOMYCIN HCL 10 G IV SOLR
2500.0000 mg | Freq: Once | INTRAVENOUS | Status: AC
  Administered 2019-10-15: 2500 mg via INTRAVENOUS
  Filled 2019-10-15: qty 2000

## 2019-10-15 MED ORDER — DIPHENHYDRAMINE HCL 25 MG PO CAPS: 25.0000 mg | ORAL_CAPSULE | Freq: Every evening | ORAL | Status: DC | PRN

## 2019-10-15 MED ORDER — POTASSIUM CHLORIDE CRYS ER 20 MEQ PO TBCR
40.0000 meq | EXTENDED_RELEASE_TABLET | Freq: Two times a day (BID) | ORAL | Status: DC
  Administered 2019-10-15 – 2019-10-16 (×2): 40 meq via ORAL
  Filled 2019-10-15 (×2): qty 2

## 2019-10-15 MED ORDER — ENOXAPARIN SODIUM 40 MG/0.4ML ~~LOC~~ SOLN
40.0000 mg | SUBCUTANEOUS | Status: DC
  Administered 2019-10-15 – 2019-10-17 (×3): 40 mg via SUBCUTANEOUS
  Filled 2019-10-15 (×3): qty 0.4

## 2019-10-15 MED ORDER — ASPIRIN 81 MG PO CHEW: 81.0000 mg | CHEWABLE_TABLET | Freq: Two times a day (BID) | ORAL | Status: DC

## 2019-10-15 MED ORDER — ADULT MULTIVITAMIN W/MINERALS CH
1.0000 | ORAL_TABLET | Freq: Every day | ORAL | Status: DC
  Administered 2019-10-15 – 2019-10-18 (×4): 1 via ORAL
  Filled 2019-10-15 (×4): qty 1

## 2019-10-15 MED ORDER — SODIUM CHLORIDE 0.9 % IV SOLN
3.0000 g | Freq: Four times a day (QID) | INTRAVENOUS | Status: DC
  Administered 2019-10-15 – 2019-10-18 (×10): 3 g via INTRAVENOUS
  Filled 2019-10-15 (×7): qty 3
  Filled 2019-10-15: qty 8
  Filled 2019-10-15 (×3): qty 3
  Filled 2019-10-15: qty 8

## 2019-10-15 MED ORDER — ONDANSETRON HCL 4 MG/2ML IJ SOLN: 4.0000 mg | Freq: Four times a day (QID) | INTRAMUSCULAR | Status: DC | PRN

## 2019-10-15 MED ORDER — LOPERAMIDE HCL 2 MG PO CAPS
2.0000 mg | ORAL_CAPSULE | Freq: Once | ORAL | Status: AC
  Administered 2019-10-15: 2 mg via ORAL
  Filled 2019-10-15: qty 1

## 2019-10-15 MED ORDER — DOCUSATE SODIUM 100 MG PO CAPS
100.0000 mg | ORAL_CAPSULE | Freq: Two times a day (BID) | ORAL | Status: DC
  Administered 2019-10-16 – 2019-10-18 (×3): 100 mg via ORAL
  Filled 2019-10-15 (×6): qty 1

## 2019-10-15 MED ORDER — FUROSEMIDE 10 MG/ML IJ SOLN
20.0000 mg | Freq: Two times a day (BID) | INTRAMUSCULAR | Status: DC
  Administered 2019-10-15 – 2019-10-16 (×2): 20 mg via INTRAVENOUS
  Filled 2019-10-15 (×2): qty 2

## 2019-10-15 MED ORDER — THIAMINE HCL 100 MG PO TABS
100.0000 mg | ORAL_TABLET | Freq: Every day | ORAL | Status: DC
  Administered 2019-10-16 – 2019-10-18 (×3): 100 mg via ORAL
  Filled 2019-10-15 (×3): qty 1

## 2019-10-15 MED ORDER — VANCOMYCIN HCL IN DEXTROSE 1-5 GM/200ML-% IV SOLN: 1000.0000 mg | Freq: Once | INTRAVENOUS | Status: DC

## 2019-10-15 MED ORDER — SODIUM CHLORIDE 0.9 % IV BOLUS
1000.0000 mL | Freq: Once | INTRAVENOUS | Status: AC
  Administered 2019-10-15: 1000 mL via INTRAVENOUS

## 2019-10-15 MED ORDER — DULOXETINE HCL 20 MG PO CPEP
20.0000 mg | ORAL_CAPSULE | Freq: Every day | ORAL | Status: DC
  Administered 2019-10-16 – 2019-10-18 (×3): 20 mg via ORAL
  Filled 2019-10-15 (×3): qty 1

## 2019-10-15 MED ORDER — LEVOTHYROXINE SODIUM 50 MCG PO TABS
50.0000 ug | ORAL_TABLET | Freq: Every day | ORAL | Status: DC
  Administered 2019-10-16 – 2019-10-18 (×3): 50 ug via ORAL
  Filled 2019-10-15 (×3): qty 1

## 2019-10-15 MED ORDER — METOPROLOL TARTRATE 25 MG PO TABS
50.0000 mg | ORAL_TABLET | Freq: Two times a day (BID) | ORAL | Status: DC
  Administered 2019-10-15 – 2019-10-18 (×6): 50 mg via ORAL
  Filled 2019-10-15 (×6): qty 2

## 2019-10-15 MED ORDER — TRAZODONE HCL 50 MG PO TABS
50.0000 mg | ORAL_TABLET | Freq: Every day | ORAL | Status: DC
  Administered 2019-10-15 – 2019-10-17 (×3): 50 mg via ORAL
  Filled 2019-10-15 (×3): qty 1

## 2019-10-15 NOTE — Progress Notes (Signed)
Received report from ED Nurse.

## 2019-10-15 NOTE — ED Notes (Signed)
Patient transported to X-ray 

## 2019-10-15 NOTE — Progress Notes (Signed)
A consult was received from an ED physician for vancomycin per pharmacy dosing.  The patient's profile has been reviewed for ht/wt/allergies/indication/available labs.   A one time order has been placed for vancomycin 2500 mg.    Further antibiotics/pharmacy consults should be ordered by admitting physician if indicated.                       Thank you, Eudelia Bunch, Pharm.D [phone] 10/15/2019 2:34 PM

## 2019-10-15 NOTE — ED Notes (Signed)
Pt denied able to urinate at this time.

## 2019-10-15 NOTE — ED Provider Notes (Signed)
[facility]-EMERGENCY DEPT Provider Note   CSN: [id number] Arrival date & time: 10/15/19  1339     History Chief Complaint  Patient presents with  . Foot Pain    [name] is a 58 y.o. male.  Patient with hx dm, presents with new/worsening ulcers to plantar aspect bilateral feet, with purulent drainage, foot redness/swelling, and area of redness/soreness extending to proximal right leg. Symptoms gradual onset, moderate, persistent, worse over course of past week.denies pain to area noting hx significant neuropathy to bil feet/legs.  States hx same, and prior admissions, wound debridement, iv abx, and amputation for similar symptoms. No fevers. No vomiting. Mild general weakness/malaise.   The history is provided by the patient.  Foot Pain Pertinent negatives include no chest pain, no abdominal pain, no headaches and no shortness of breath.       Past Medical History:  Diagnosis Date  . Alcohol dependence (HCC)   . Arthritis    hips, hands  . Bilateral carpal tunnel syndrome   . Bilateral leg edema    Chronic  . Diabetes mellitus without complication (HCC)    type 2  . Diverticulitis    portion of colon removed  . DOE (dyspnea on exertion)    occ  . Elevated liver enzymes   . Fatty liver   . GERD (gastroesophageal reflux disease)    occ  . Hammer toe   . Hip pain   . History of ventral hernia repair 2016   x2  . Hyperlipidemia    pt unsure  . Hypertension   . Marijuana abuse   . Morbid obesity (HCC)   . Neuromuscular disorder (HCC)    peripheral neuropathy feet and few fingers  . OSA (obstructive sleep apnea)    has OSA-not used CPAP 2-3 yrs could not tolerate cpap  . PONV (postoperative nausea and vomiting)   . Toe ulcer (HCC)    left healed    Patient Active Problem List   Diagnosis Date Noted  . Lymphedema 05/04/2019  . Alcoholic cirrhosis of liver without ascites (HCC) 04/27/2019  . Obesity hypoventilation syndrome (HCC)  04/27/2019  . Depression 04/25/2019  . Severe alcohol use disorder (HCC)   . DOE (dyspnea on exertion) 04/22/2019  . Hypothyroidism 04/22/2019  . Anemia 02/06/2019  . Cold intolerance 02/06/2019  . Acute on chronic diastolic CHF (congestive heart failure) (HCC) 01/30/2019  . Avascular necrosis of hip, left (HCC) 11/02/2018  . Decreased hearing of both ears 10/30/2018  . Avascular necrosis of hip, right (HCC) 08/16/2018  . Bilateral leg edema 02/27/2018  . Marijuana abuse 02/16/2018  . Ulcer of left heel (HCC) 12/21/2016  . Bilateral carpal tunnel syndrome 12/15/2016  . Fatty liver 12/14/2016  . Hyperlipidemia 11/25/2016  . Elevated liver enzymes 11/25/2016  . Prediabetes 11/25/2016  . Hypertension 11/23/2016  . Neuropathy 11/23/2016  . Toe amputation status, right 11/23/2016  . Morbid obesity (HCC) 11/23/2016  . OSA (obstructive sleep apnea) 11/23/2016  . Other hammer toe (acquired) 11/11/2013  . Exstrophy of bladder 11/11/2013    Past Surgical History:  Procedure Laterality Date  . AMPUTATION TOE Left 05/25/2018   Procedure: AMPUTATION TOE left 3rd;  Surgeon: Toni ArthursHewitt, John, MD;  Location: [facility];  Service: Orthopedics;  Laterality: Left;  60min, to follow  . AMPUTATION TOE Left 06/28/2018   Procedure: Left foot revision 3rd toe amputation including 3rd metatarsal;  Surgeon: Toni ArthursHewitt, John, MD;  Location: [facility];  Service: Orthopedics;  Laterality: Left;   . BICEPS TENDON REPAIR Left 2014   Partial  . COLONOSCOPY    . HERNIA REPAIR  2016   ventral  . HIP CLOSED REDUCTION Right 09/01/2018   Procedure: CLOSED REDUCTION HIP;  Surgeon: Yolonda Kida, MD;  Location: [facility];  Service: Orthopedics;  Laterality: Right;  . JOINT REPLACEMENT     b/l knees   . TOE AMPUTATION Right 09/2013  . TOTAL HIP ARTHROPLASTY Right 08/16/2018   Procedure: TOTAL HIP ARTHROPLASTY ANTERIOR APPROACH;  Surgeon: Samson Frederic, MD;  Location: [facility];   Service: Orthopedics;  Laterality: Right;  . TOTAL HIP ARTHROPLASTY Left 11/02/2018   Procedure: TOTAL HIP ARTHROPLASTY ANTERIOR APPROACH;  Surgeon: Samson Frederic, MD;  Location: [facility];  Service: Orthopedics;  Laterality: Left;  . TOTAL KNEE ARTHROPLASTY     bilat       Family History  Adopted: Yes  Family history unknown: Yes    Social History   Tobacco Use  . Smoking status: Former Smoker    Packs/day: 0.25    Years: 10.00    Pack years: 2.50    Types: Cigarettes  . Smokeless tobacco: Never Used  . Tobacco comment: quit 2018  Substance Use Topics  . Alcohol use: Yes    Comment: 2-3 drinks 4-5 nights a week, liquor  . Drug use: Yes    Frequency: 1.0 times per week    Types: Marijuana    Comment: "4 times a month, maybe"    Home Medications Prior to Admission medications   Medication Sig Start Date End Date Taking? Authorizing Provider  aspirin 81 MG chewable tablet Chew 1 tablet (81 mg total) by mouth 2 (two) times daily. 11/03/18   Swinteck, Arlys John, MD  calcium carbonate (TUMS - DOSED IN MG ELEMENTAL CALCIUM) 500 MG chewable tablet Chew 2 tablets by mouth at bedtime as needed for indigestion or heartburn.     [provider]  diphenhydrAMINE (BENADRYL ALLERGY) 25 MG tablet Take 25 mg by mouth at bedtime as needed for allergies.     [provider]  docusate sodium (COLACE) 100 MG capsule Take 1 capsule (100 mg total) by mouth 2 (two) times daily. Patient taking differently: Take 100 mg by mouth daily.  11/03/18   Swinteck, Arlys John, MD  DULoxetine (CYMBALTA) 20 MG capsule Take 1 capsule (20 mg total) by mouth daily. 07/31/19   [name], MD  furosemide (LASIX) 40 MG tablet TAKE 1 TABLET (40 MG TOTAL) BY MOUTH 2 (TWO) TIMES DAILY. 10/01/19   Burns, Bobette Mo, MD  KLOR-CON M20 20 MEQ tablet TAKE 2 TABLETS (40 MEQ TOTAL) BY MOUTH 3 (THREE) TIMES DAILY. 10/01/19   [name], MD  levothyroxine (SYNTHROID) 50 MCG tablet Take 1 tablet (50 mcg total) by  mouth daily. Need office visit for more refills. 10/02/19   [name], MD  metoprolol tartrate (LOPRESSOR) 100 MG tablet TAKE 1 TABLET (100 MG TOTAL) BY MOUTH 2 (TWO) TIMES DAILY. 03/20/18   [name], MD  Multiple Vitamin (MULTIVITAMIN WITH MINERALS) TABS tablet Take 1 tablet by mouth daily. 02/20/18   Mikhail, Nita Sells, DO  senna (SENOKOT) 8.6 MG TABS tablet Take 1 tablet (8.6 mg total) by mouth 2 (two) times daily. Patient taking differently: Take 1 tablet by mouth daily as needed for mild constipation.  08/17/18   Swinteck, Arlys John, MD  thiamine 100 MG tablet TAKE 1 TABLET BY MOUTH EVERY DAY 10/08/19   [name], MD  traZODone (DESYREL) 50 MG tablet TAKE 1-2 TABLETS (50-100 MG TOTAL) BY MOUTH AT BEDTIME AS NEEDED FOR SLEEP. 06/25/19   [name], MD    Allergies    Claritin [loratadine]  Review of Systems   Review of Systems  Constitutional: Negative for fever.  HENT: Negative for sore throat.   Eyes: Negative for redness.  Respiratory: Negative for shortness of breath.   Cardiovascular: Negative for chest pain.  Gastrointestinal: Negative for abdominal pain.  Endocrine: Negative for polyuria.  Genitourinary: Negative for flank pain.  Musculoskeletal: Negative for back pain and neck pain.  Skin: Positive for wound.  Neurological: Negative for headaches.  Hematological: Does not bruise/bleed easily.  Psychiatric/Behavioral: Negative for confusion.    Physical Exam Updated Vital Signs BP (!) 150/92 (BP Location: Left Arm)   Pulse (!) 116   Temp 98.4 F (36.9 C) (Oral)   Resp 18   Ht 1.854 m ( )   Wt 113.4 kg   SpO2 99%   BMI 32.98 kg/m   Physical Exam Vitals and nursing note reviewed.  Constitutional:      Appearance: Normal appearance. He is well-developed.  HENT:     Head: Atraumatic.     Nose: Nose normal.     Mouth/Throat:     Mouth: Mucous membranes are moist.     Pharynx: Oropharynx is clear.  Eyes:     General: No scleral icterus.     Conjunctiva/sclera: Conjunctivae normal.  Neck:     Trachea: No tracheal deviation.  Cardiovascular:     Rate and Rhythm: Regular rhythm. Tachycardia present.     Pulses: Normal pulses.     Heart sounds: Normal heart sounds. No murmur. No friction rub. No gallop.   Pulmonary:     Effort: Pulmonary effort is normal. No accessory muscle usage or respiratory distress.     Breath sounds: Normal breath sounds.  Abdominal:     General: Bowel sounds are normal. There is no distension.     Palpations: Abdomen is soft.     Tenderness: There is no abdominal tenderness. There is no guarding.  Genitourinary:    Comments: No cva tenderness. Musculoskeletal:     Cervical back: Normal range of motion and neck supple. No rigidity.     Comments: Bilateral foot swelling. Open wounds/ulcers to plantar aspect bil feet. On left foot, approximately 4 cm diameter, on right irregular, 2 by 4 cm, w some purulent drainage. Feet w swelling, erythema c/w cellulitis. Distal pulse palp. No crepitus. Area of erythema c/w cellulitis/lymphangitis proximal right leg/thigh area.   Skin:    General: Skin is warm and dry.     Findings: No rash.  Neurological:     Mental Status: He is alert.     Comments: Alert, speech clear.   Psychiatric:        Mood and Affect: Mood normal.           ED Results / Procedures / Treatments   Labs (all labs ordered are listed, but only abnormal results are displayed) Results for orders placed or performed during the hospital encounter of 10/15/19  Comprehensive metabolic panel  Result Value Ref Range   Sodium 133 (L) 135 - 145 mmol/L   Potassium 3.5 3.5 - 5.1 mmol/L   Chloride 95 (L) 98 - 111 mmol/L   CO2 25 22 - 32 mmol/L   Glucose, Bld 123 (H) 70 - 99 mg/dL   BUN 11 6 - 20 mg/dL   Creatinine, Ser  0.79 0.61 - 1.24 mg/dL   Calcium 8.8 (L) 8.9 - 10.3 mg/dL   Total Protein 8.2 (H) 6.5 - 8.1 g/dL   Albumin 2.9 (L) 3.5 - 5.0 g/dL   AST 24 15 - 41 U/L   ALT 23 0 - 44 U/L    Alkaline Phosphatase 182 (H) 38 - 126 U/L   Total Bilirubin 1.4 (H) 0.3 - 1.2 mg/dL   GFR calc non Af Amer >60 >60 mL/min   GFR calc Af Amer >60 >60 mL/min   Anion gap 13 5 - 15  CBC with Differential  Result Value Ref Range   WBC 12.1 (H) 4.0 - 10.5 K/uL   RBC 3.91 (L) 4.22 - 5.81 MIL/uL   Hemoglobin 11.9 (L) 13.0 - 17.0 g/dL   HCT 36.2 (L) 39.0 - 52.0 %   MCV 92.6 80.0 - 100.0 fL   MCH 30.4 26.0 - 34.0 pg   MCHC 32.9 30.0 - 36.0 g/dL   RDW 14.3 11.5 - 15.5 %   Platelets 266 150 - 400 K/uL   nRBC 0.0 0.0 - 0.2 %   Neutrophils Relative % 82 %   Neutro Abs 9.9 (H) 1.7 - 7.7 K/uL   Lymphocytes Relative 12 %   Lymphs Abs 1.4 0.7 - 4.0 K/uL   Monocytes Relative 5 %   Monocytes Absolute 0.7 0.1 - 1.0 K/uL   Eosinophils Relative 1 %   Eosinophils Absolute 0.1 0.0 - 0.5 K/uL   Basophils Relative 0 %   Basophils Absolute 0.1 0.0 - 0.1 K/uL   Immature Granulocytes 0 %   Abs Immature Granulocytes 0.05 0.00 - 0.07 K/uL   EKG None  Radiology No results found.  Procedures Procedures (including critical care time)  Medications Ordered in ED Medications  vancomycin (VANCOCIN) IVPB 1000 mg/200 mL premix (has no administration in time range)  cefTRIAXone (ROCEPHIN) 2 g in sodium chloride 0.9 % 100 mL IVPB (has no administration in time range)    ED Course  I have reviewed the triage vital signs and the nursing notes.  Pertinent labs & imaging results that were available during my care of the patient were reviewed by me and considered in my medical decision making (see chart for details).    MDM Rules/Calculators/A&P                      Iv ns bolus. Labs sent.   Reviewed nursing notes and prior charts for additional history.   Labs reviewed/interpreted by me - wbc elevated. Lactate and blood cultures added to labs.   Iv antibiotics given, rocephin and vanc.   Hospitalists consulted for admission.    Final Clinical Impression(s) / ED Diagnoses Final diagnoses:  None      Rx / DC Orders ED Discharge Orders    None       Lajean Saver, MD 10/15/19 1445

## 2019-10-15 NOTE — ED Triage Notes (Signed)
Pt complaint of ulcers to bottom of feet; doctor requested him come here for IV antibiotics.

## 2019-10-15 NOTE — H&P (Signed)
History and Physical    DOA: 10/15/2019  PCP: [name], MD  Patient coming from: home  Chief Complaint: Worsening leg wounds  HPI: [name] is a 58 y.o. male with history h/o HTN,, hyperlipidemia, diastolic CHF, chronic bilateral leg edema, peripheral neuropathy with chronic foot ulcers, liver cirrhosis, GERD, arthritis, polysubstance abuse (alcohol, marijuana), OSA not on CPAP but uses 2 L O2 at night, hypothyroidism presents with complaints of worsening foot ulcers with increasing size/purulent drainage.  Patient had previously seen podiatry (Dr. Edgar Frisk. Samuella Cota in 2019) but most recently been following [facility]-Dr. Ardyth Gal for [facility] boots/wound care.  According to patient sometime around Thanksgiving his legs appeared to improve with reduce swelling as well as near complete healing of the left foot ulcer.  During that time, he only had a "scab" on the plantar aspect of right foot.  He was apparently released from the clinic and advised to come back if he has any worsening swellings.  He was using compression boots at home alternating in each leg.  He noted recurrence of foot wounds and worsening over the last week with purulent drainage refractory to local wound care that he was doing himself with Santyl.  He presented to [facility] today again and was directed to come to the ED for IV antibiotics.  He also reports noting a small painful swelling along the right thigh-in his words "the size of a nickel" few days back which progressed to surrounding erythema.  Of note, patient was admitted in July of this year for acute diastolic CHF and had ABIs done at that time revealing normal blood flow to LE.  He denies any fever or chills. ED course: Afebrile, pulse 116, blood pressure 150/92, respiratory rate 18, O2 sat 99% on room air.  Labs show WBC 12.1, hemoglobin 11.9, hematocrit 36.2, platelets 266, sodium 133, potassium 3.5, chloride 95, bicarb 25, BUN 11,  creatinine 0.79, calcium 8.8, blood glucose 123.  While in ED patient received 1 L of IV fluid bolus, 1 dose of ceftriaxone/vancomycin and hospitalist service consulted for admission.  Covid screen pending at this time.   Review of Systems: As per HPI otherwise 10 point review of systems negative.    Past Medical History:  Diagnosis Date  . Alcohol dependence (HCC)   . Arthritis    hips, hands  . Bilateral carpal tunnel syndrome   . Bilateral leg edema    Chronic  . Diabetes mellitus without complication (HCC)    type 2  . Diverticulitis    portion of colon removed  . DOE (dyspnea on exertion)    occ  . Elevated liver enzymes   . Fatty liver   . GERD (gastroesophageal reflux disease)    occ  . Hammer toe   . Hip pain   . History of ventral hernia repair 2016   x2  . Hyperlipidemia    pt unsure  . Hypertension   . Marijuana abuse   . Morbid obesity (HCC)   . Neuromuscular disorder (HCC)    peripheral neuropathy feet and few fingers  . OSA (obstructive sleep apnea)    has OSA-not used CPAP 2-3 yrs could not tolerate cpap  . PONV (postoperative nausea and vomiting)   . Toe ulcer (HCC)    left healed    Past Surgical History:  Procedure Laterality Date  . AMPUTATION TOE Left 05/25/2018   Procedure: AMPUTATION TOE left 3rd;  Surgeon: Toni Arthurs, MD;  Location: [facility];  Service: Orthopedics;  Laterality: Left;  60min, to follow  . AMPUTATION TOE Left 06/28/2018   Procedure: Left foot revision 3rd toe amputation including 3rd metatarsal;  Surgeon: Toni ArthursHewitt, John, MD;  Location: [facility];  Service: Orthopedics;  Laterality: Left;  60min  . BICEPS TENDON REPAIR Left 2014   Partial  . COLONOSCOPY    . HERNIA REPAIR  2016   ventral  . HIP CLOSED REDUCTION Right 09/01/2018   Procedure: CLOSED REDUCTION HIP;  Surgeon: Yolonda Kidaogers, Jason Patrick, MD;  Location: [facility];  Service: Orthopedics;  Laterality: Right;  . JOINT REPLACEMENT     b/l knees     . TOE AMPUTATION Right 09/2013  . TOTAL HIP ARTHROPLASTY Right 08/16/2018   Procedure: TOTAL HIP ARTHROPLASTY ANTERIOR APPROACH;  Surgeon: Samson FredericSwinteck, Brian, MD;  Location: [facility];  Service: Orthopedics;  Laterality: Right;  . TOTAL HIP ARTHROPLASTY Left 11/02/2018   Procedure: TOTAL HIP ARTHROPLASTY ANTERIOR APPROACH;  Surgeon: Samson FredericSwinteck, Brian, MD;  Location: [facility];  Service: Orthopedics;  Laterality: Left;  . TOTAL KNEE ARTHROPLASTY     bilat    Social history:  reports that he has quit smoking. His smoking use included cigarettes. He has a 2.50 pack-year smoking history. He has never used smokeless tobacco. He reports current alcohol use. He reports current drug use. Frequency: 1.00 time per week. Drug: Marijuana.   Allergies  Allergen Reactions  . Claritin [Loratadine] Shortness Of Breath and Anxiety    Family History  Adopted: Yes  Family history unknown: Yes      Prior to Admission medications   Medication Sig Start Date End Date Taking? Authorizing Provider  aspirin 81 MG chewable tablet Chew 1 tablet (81 mg total) by mouth 2 (two) times daily. 11/03/18   Swinteck, Arlys JohnBrian, MD  calcium carbonate (TUMS - DOSED IN MG ELEMENTAL CALCIUM) 500 MG chewable tablet Chew 2 tablets by mouth at bedtime as needed for indigestion or heartburn.     [provider]  diphenhydrAMINE (BENADRYL ALLERGY) 25 MG tablet Take 25 mg by mouth at bedtime as needed for allergies.     [provider]  docusate sodium (COLACE) 100 MG capsule Take 1 capsule (100 mg total) by mouth 2 (two) times daily. Patient taking differently: Take 100 mg by mouth daily.  11/03/18   Swinteck, Arlys JohnBrian, MD  DULoxetine (CYMBALTA) 20 MG capsule Take 1 capsule (20 mg total) by mouth daily. 07/31/19   Pincus SanesBurns, Stacy J, MD  furosemide (LASIX) 40 MG tablet TAKE 1 TABLET (40 MG TOTAL) BY MOUTH 2 (TWO) TIMES DAILY. 10/01/19   Burns, Bobette MoStacy J, MD  KLOR-CON M20 20 MEQ tablet TAKE 2 TABLETS (40 MEQ TOTAL) BY MOUTH 3 (THREE)  TIMES DAILY. 10/01/19   Pincus SanesBurns, Stacy J, MD  levothyroxine (SYNTHROID) 50 MCG tablet Take 1 tablet (50 mcg total) by mouth daily. Need office visit for more refills. 10/02/19   Pincus SanesBurns, Stacy J, MD  metoprolol tartrate (LOPRESSOR) 100 MG tablet TAKE 1 TABLET (100 MG TOTAL) BY MOUTH 2 (TWO) TIMES DAILY. 03/20/18   Pincus SanesBurns, Stacy J, MD  Multiple Vitamin (MULTIVITAMIN WITH MINERALS) TABS tablet Take 1 tablet by mouth daily. 02/20/18   Mikhail, Nita SellsMaryann, DO  senna (SENOKOT) 8.6 MG TABS tablet Take 1 tablet (8.6 mg total) by mouth 2 (two) times daily. Patient taking differently: Take 1 tablet by mouth daily as needed for mild constipation.  08/17/18   Swinteck, Arlys JohnBrian, MD  thiamine 100 MG tablet TAKE 1 TABLET  BY MOUTH EVERY DAY 10/08/19   Binnie Rail, MD  traZODone (DESYREL) 50 MG tablet TAKE 1-2 TABLETS (50-100 MG TOTAL) BY MOUTH AT BEDTIME AS NEEDED FOR SLEEP. 06/25/19   Binnie Rail, MD    Physical Exam: Vitals:   10/15/19 1346 10/15/19 1347  BP: (!) 150/92   Pulse: (!) 116   Resp: 18   Temp: 98.4 F (36.9 C)   TempSrc: Oral   SpO2: 99%   Weight:  113.4 kg  Height:  6\' 1"  (1.854 m)    Constitutional: NAD, calm, comfortable Eyes: PERRL, lids and conjunctivae normal ENMT: Mucous membranes are moist. Posterior pharynx clear of any exudate or lesions.Normal dentition.  Neck: normal, supple, no masses, no thyromegaly Respiratory: clear to auscultation bilaterally, no wheezing, no crackles. Normal respiratory effort. No accessory muscle use.  Cardiovascular: Regular rate and rhythm, no murmurs / rubs / gallops. No extremity edema. 2+ pedal pulses. No carotid bruits.  Abdomen: no tenderness, no masses palpated. No hepatosplenomegaly. Bowel sounds positive.  Musculoskeletal: S/p right toe amputation, foot ulcers as shown below.  Bilateral lower leg/feet swelling left greater than right with trace to 1+ pitting edema. Neurologic: CN 2-12 grossly intact. Sensation intact, DTR normal. Strength 5/5 in all  4.  Psychiatric: Normal judgment and insight. Alert and oriented x 3. Normal mood.  SKIN/catheters: Area of redness along lower thirds of both lower extremities, although appears to be warm to touch on the left lower extremity.  He also has area of redness with central induration along the right thigh with tenderness as shown below, chronic appearing foot ulcer on left side 4 by 3 cm, purulent drainage with acute on chronic worsening of her right foot ulcer 2 x 4 cm noted.             Labs on Admission: I have personally reviewed following labs and imaging studies  CBC: Recent Labs  Lab 10/15/19 1354  WBC 12.1*  NEUTROABS 9.9*  HGB 11.9*  HCT 36.2*  MCV 92.6  PLT 160   Basic Metabolic Panel: Recent Labs  Lab 10/15/19 1354  NA 133*  K 3.5  CL 95*  CO2 25  GLUCOSE 123*  BUN 11  CREATININE 0.79  CALCIUM 8.8*   GFR: Estimated Creatinine Clearance: 132.8 mL/min (by C-G formula based on SCr of 0.79 mg/dL). Liver Function Tests: Recent Labs  Lab 10/15/19 1354  AST 24  ALT 23  ALKPHOS 182*  BILITOT 1.4*  PROT 8.2*  ALBUMIN 2.9*   No results for input(s): LIPASE, AMYLASE in the last 168 hours. No results for input(s): AMMONIA in the last 168 hours. Coagulation Profile: No results for input(s): INR, PROTIME in the last 168 hours. Cardiac Enzymes: No results for input(s): CKTOTAL, CKMB, CKMBINDEX, TROPONINI in the last 168 hours. BNP (last 3 results) Recent Labs    04/23/19 1048  PROBNP 505.0*   HbA1C: No results for input(s): HGBA1C in the last 72 hours. CBG: No results for input(s): GLUCAP in the last 168 hours. Lipid Profile: No results for input(s): CHOL, HDL, LDLCALC, TRIG, CHOLHDL, LDLDIRECT in the last 72 hours. Thyroid Function Tests: No results for input(s): TSH, T4TOTAL, FREET4, T3FREE, THYROIDAB in the last 72 hours. Anemia Panel: No results for input(s): VITAMINB12, FOLATE, FERRITIN, TIBC, IRON, RETICCTPCT in the last 72 hours. Urine  analysis: No results found for: COLORURINE, APPEARANCEUR, LABSPEC, PHURINE, GLUCOSEU, HGBUR, BILIRUBINUR, KETONESUR, PROTEINUR, UROBILINOGEN, NITRITE, LEUKOCYTESUR  Radiological Exams on Admission: Personally reviewed  No results found.  EKG: Pending     Assessment and Plan:   Principal Problem:   Cellulitis Active Problems:   SIRS (systemic inflammatory response syndrome) (HCC)   Chronic foot ulcer (HCC)   Hypertension   Neuropathy   Morbid obesity (HCC)   OSA (obstructive sleep apnea)   Hyperlipidemia   Marijuana abuse   Bilateral leg edema   Hypothyroidism   Severe alcohol use disorder (HCC)   Alcoholic cirrhosis of liver without ascites (HCC)   Obesity hypoventilation syndrome (HCC)   Chronic diastolic CHF (congestive heart failure) (HCC)    1.  Right lower extremity cellulitis with associated SIRS: Patient has tachycardia, mild leukocytosis but afebrile, normotensive and with normal lactate level.  Redness could be related to thrombophlebitis in the setting of problem #2 versus developing abscess.  Blood cultures/wound cultures from foot wounds to be sent.  Will not continue IV fluids and concern for exacerbating diastolic CHF/leg edema.  Will admit with IV Unasyn and adjust antibiotics as culture data available.  Obtain Doppler to rule out DVT/soft tissue abscess.  2.  Acute worsening of chronic foot ulcers: Likely neuropathic wounds with poor healing in the setting of hypoalbuminemia and chronic leg edema.Patient reports enlarging foot wounds and noted to have drainage from right foot ulcer: Will consult wound care/VVS for now- may consult  podiatry in a.m if needed.--patient previously seen by orthopedics in January/2020 for problem #6 and by Dr. Edgar Frisk. Samuella Cota in 2019 for foot ulcerations.  X-ray of bilateral feet ordered  3.  Mild hyponatremia: Hypervolemic hyponatremia in the setting of chronic diastolic CHF versus diuretic induced intravascular  depletion/dehydration--Should improve with IV hydration that he received in the ED.  Labs in a.m.  4. Chronic leg edema with stasis dermatitis: Patient reports chronic leg swellings which improved transiently with Unna boots, now recurrent.  He has on and off erythema during any given time of the day--suggestive of stasis dermatitis.  He also has chronic diastolic CHF and liver cirrhosis which might be contributing to leg edema.  Resume Lasix with potassium supplementation--will change to IV.  Monitor renal function   5.  Polysubstance abuse: Patient states he was a heavy drinker in the past but since his last hospitalization, he has drastically cut down alcohol use.  Only drinks 1-2 beers on holidays (last 2 drinks were on Thanksgiving and Christmas day).  Resume multivitamin, thiamine.  No signs of acute withdrawal.?  On cannabidiol p.o. as outpatient.  Urine drug screen ordered.  6.  Avascular necrosis of left hip: S/p total hip arthroplasty by orthopedics in January 2020.  7. Alcoholic liver Cirrhosis- cirrhosis noted on ultrasound in July admission, likely related to chronic alcohol use--used to drink vodka heavily at that time.  Hypoalbuminemia/leg edema likely related to liver disease.  8.Insensinate Neuropathy- states he barely has feeling in his feet-resume home meds, likely venous stasis along with neuropathy contributing to chronic foot ulcers.  9. Hypothyroidism- cont Synthroid  10. Hypertension- Lisinopril, Lasix, Metoprolol  11.  Chronic diastolic CHF: Patient was admitted in July for acute CHF exacerbation.  He takes Lasix 40 twice daily with potassium supplementation as outpatient.  Will resume home regimen.  12. Morbid obesity- I have encouraged him to lose weight- he admits that his dyspnea on exertion is likely due to him being overweight   13.  Obstructive sleep apnea: Does not use CPAP anymore.  Was advised to repeat sleep study in last admission but states his PCP is  working on a referral.  Meanwhile  he has been set up with home O2 2 L to be used at night through his PCP.  14. ?  PreDiabetes : apparently was told he didn't need meds by PCP. Last HgbA1c in 04/2019 was 5.0. BG 123 on BMP this afternoon.   15. Protein calorie malnutrition: Albumin level at 2.9.  Nutrition consult for protein supplementation which will be needed for adequate wound healing   DVT prophylaxis: Lovenox  COVID screen: Pending  Code Status: Full code.Health care proxy would be his brother Parminder Cupples  Patient/Family Communication: Discussed with patient and all questions answered to satisfaction.  Consults called: Wound care, vein and vascular on-call (may need to consult podiatry in a.m.) Admission status :I certify that at the point of admission it is my clinical judgment that the patient will require inpatient hospital care spanning beyond 2 midnights from the point of admission due to high intensity of service and high frequency of surveillance required.Inpatient status is judged to be reasonable and necessary in order to provide the required intensity of service to ensure the patient's safety. The patient's presenting symptoms, physical exam findings, and initial radiographic and laboratory data in the context of their chronic comorbidities is felt to place them at high risk for further clinical deterioration. The following factors support the patient status of inpatient : Cellulitis/infected foot wounds requiring IV antibiotics and possibly surgical wound debridement Expected LOS: 3 to 4 days    Alessandra Bevels MD [facility] Pager [phone]  If 7PM-7AM, please contact night-coverage [email or web address] Password [email or web address]  10/15/2019, 4:08 PM

## 2019-10-16 ENCOUNTER — Inpatient Hospital Stay (HOSPITAL_COMMUNITY): Payer: Medicare Other

## 2019-10-16 DIAGNOSIS — E662 Morbid (severe) obesity with alveolar hypoventilation: Secondary | ICD-10-CM

## 2019-10-16 LAB — COMPREHENSIVE METABOLIC PANEL
ALT: 18 U/L (ref 0–44)
AST: 19 U/L (ref 15–41)
Albumin: 2.7 g/dL — ABNORMAL LOW (ref 3.5–5.0)
Alkaline Phosphatase: 164 U/L — ABNORMAL HIGH (ref 38–126)
Anion gap: 12 (ref 5–15)
BUN: 10 mg/dL (ref 6–20)
CO2: 27 mmol/L (ref 22–32)
Calcium: 8.9 mg/dL (ref 8.9–10.3)
Chloride: 99 mmol/L (ref 98–111)
Creatinine, Ser: 0.74 mg/dL (ref 0.61–1.24)
GFR calc Af Amer: >60 mL/min (ref >60)
GFR calc non Af Amer: >60 mL/min (ref >60)
Glucose, Bld: 92 mg/dL (ref 70–99)
Potassium: 3.4 mmol/L — ABNORMAL LOW (ref 3.5–5.1)
Sodium: 138 mmol/L (ref 135–145)
Total Bilirubin: 1 mg/dL (ref 0.3–1.2)
Total Protein: 7.6 g/dL (ref 6.5–8.1)

## 2019-10-16 LAB — RAPID URINE DRUG SCREEN, HOSP PERFORMED
Amphetamines: NOT DETECTED
Barbiturates: NOT DETECTED
Benzodiazepines: NOT DETECTED
Cocaine: NOT DETECTED
Opiates: NOT DETECTED
Tetrahydrocannabinol: NOT DETECTED

## 2019-10-16 LAB — CBC
HCT: 33.7 % — ABNORMAL LOW (ref 39.0–52.0)
Hemoglobin: 10.8 g/dL — ABNORMAL LOW (ref 13.0–17.0)
MCH: 30.1 pg (ref 26.0–34.0)
MCHC: 32 g/dL (ref 30.0–36.0)
MCV: 93.9 fL (ref 80.0–100.0)
Platelets: 240 10*3/uL (ref 150–400)
RBC: 3.59 MIL/uL — ABNORMAL LOW (ref 4.22–5.81)
RDW: 14.4 % (ref 11.5–15.5)
WBC: 7.5 10*3/uL (ref 4.0–10.5)
nRBC: 0 % (ref 0.0–0.2)

## 2019-10-16 MED ORDER — PRO-STAT SUGAR FREE PO LIQD
30.0000 mL | Freq: Two times a day (BID) | ORAL | Status: DC
  Administered 2019-10-16 – 2019-10-18 (×5): 30 mL via ORAL
  Filled 2019-10-16 (×5): qty 30

## 2019-10-16 MED ORDER — POTASSIUM CHLORIDE CRYS ER 20 MEQ PO TBCR
20.0000 meq | EXTENDED_RELEASE_TABLET | Freq: Two times a day (BID) | ORAL | Status: DC
  Administered 2019-10-16 – 2019-10-18 (×4): 20 meq via ORAL
  Filled 2019-10-16 (×4): qty 1

## 2019-10-16 MED ORDER — FUROSEMIDE 40 MG PO TABS
40.0000 mg | ORAL_TABLET | Freq: Two times a day (BID) | ORAL | Status: DC
  Administered 2019-10-17 – 2019-10-18 (×3): 40 mg via ORAL
  Filled 2019-10-16 (×3): qty 1

## 2019-10-16 MED ORDER — JUVEN PO PACK
1.0000 | PACK | Freq: Two times a day (BID) | ORAL | Status: DC
  Administered 2019-10-16 – 2019-10-17 (×4): 1 via ORAL
  Filled 2019-10-16 (×6): qty 1

## 2019-10-16 NOTE — Progress Notes (Addendum)
PROGRESS NOTE    [name]  ZOX:[id number] DOB: 07/18/1961 DOA: 10/15/2019 PCP: Pincus SanesBurns, Stacy J, MD      Brief Narrative:  [name] is a 58 y.o. M with dCHF, DM diet controlled with neuropathy, HTN, OSA on nocturnal oxygen not CPAP, MO, chronic venous insufficiency, former alcohol use, and hypothyroidism who presented with worsening foot ulcers.  Patient is chronic venous insufficiency, followed by [facility] for Unna boots, and wound care.  Patient's chronic left foot ulcer had improved dramatically by around 1 month ago, but over the last several weeks, [name] has been on his feet more often, and noticed a recurrence of the foot wounds, purulent drainage.  This progressed until the day of admission, was more painful, with extensive redness spreading on the legs.  In the ER, afebrile, heart rate 116, WBC 12 K.  [name] was started on Unasyn in the hospital service were asked to evaluate for admission.       Assessment & Plan:  Diabetic foot ulcers Abnormal left foot radiograph Radiographs of the feet showed no findings on the right, but "small erosion at the first tarsometatarsal joint with slight irregularity at the base of the fifth metatarsal. Osteomyelitis cannot be excluded."  ABIs earlier this year normal. -Continue Unasyn -Obtain MRI foot -WOC consult -Nutrition consult -Compression hose when cellulitis resolved   Right thigh lump Redness in the right thigh is spreading around a firm painful lump in the anterior thigh. Abscess?   -Obtain US soft tissue  Chronic diastolic CHF Swelling appears improved. -Stop IV Lasix -Resume home oral Lasix -Continue K  Venous insufficiency -Compression hose when cellulitis resolved  Diabetes with neuropathy Glucoses well controlled with diet. -Check hemoglobin A1c -Continue Cymbalta  Hypertension Blood pressure well controlled -Continue metoprolol  Sleep apnea -Oxygen at night  Alcohol use Quit  completely 9 months ago -Continue thiamine and multivitamin -Continue trazodone at night  Hypothyroidism -Continue levothyroxine  Anemia, normocytic Likely from chronic disease        MDM and disposition: The below labs and imaging reports were reviewed and summarized above.  Medication management as above.  The patient was admitted with diabetic foot infection.  This appears to be improving with IV antibiotics, but the radiograph raises the question of osteomyelitis.  Pending the MRI findings, if there is signs of osteomyelitis, I will consult orthopedics.  If not, will likely continue IV antibiotics for 24-48 more hours, then discharge home with Augmentin and close vascular vein follow-up.   Barriers to discharge: MRI results       DVT prophylaxis: Lovenox Code Status: Full Family Communication:     Consultants:     Procedures:   12/29 MRI left foot  Antimicrobials:   Vancomycin and ceftriaxone x112/28  Unasyn 12/28>>   Subjective: Feeling well, redness is improving.  No fever.  No confusion.  No orthopnea or dyspnea.  Objective: Vitals:   10/15/19 1849 10/15/19 2306 10/16/19 0616 10/16/19 1440  BP: (!) 143/80 111/70 124/70 131/78  Pulse: 90 86 83 77  Resp: 16 20 20 17   Temp: 98.9 F (37.2 C) 98.1 F (36.7 C) 98 F (36.7 C) (!) 97.5 F (36.4 C)  TempSrc: Oral Oral Oral Oral  SpO2: 100% 96% 95% 99%  Weight:      Height:        Intake/Output Summary (Last 24 hours) at 10/16/2019 1502 Last data filed at 10/16/2019 1300 Gross per 24 hour  Intake 1750 ml  Output 2200 ml  Net -450 ml   Filed Weights   10/15/19 1347  Weight: 113.4 kg    Examination: General appearance: Well-nourished adult male, alert and in no acute distress.   HEENT: Anicteric, conjunctiva pink, lids and lashes normal. No nasal deformity, discharge, epistaxis.  Lips moist, dentition normal, oropharynx moist, no oral lesions.   Skin: Warm and dry.  No jaundice.  There is  venous stasis change to bilateral lower extremities, the left ankle is swollen and cellulitic, and the right groin has lymphangitis, which she reports is improving and fading.  The left foot has a 2 cm x 2 cm ulcer with several centimeters of surrounding discoloration.  The right plantar foot has a laceration, which appears somewhat deep.  See previous pictures. Cardiac: RRR, nl S1-S2, no murmurs appreciated.  Capillary refill is brisk.  JVP normal.  Trace left ankle LE edema.  Radial pulses 2+ and symmetric. Respiratory: Normal respiratory rate and rhythm.  CTAB without rales or wheezes. Abdomen: Abdomen soft.  No TTP or guarding. No ascites, distension, hepatosplenomegaly.   MSK: Swelling of the left ankle, [name] has had toe amputated on the left foot. Neuro: Awake and alert.  EOMI, moves all extremities. Speech fluent.    Psych: Sensorium intact and responding to questions, attention normal. Affect normal.  Judgment and insight appear normal.    Data Reviewed: I have personally reviewed following labs and imaging studies:  CBC: Recent Labs  Lab 10/15/19 1354 10/16/19 0507  WBC 12.1* 7.5  NEUTROABS 9.9*  --   HGB 11.9* 10.8*  HCT 36.2* 33.7*  MCV 92.6 93.9  PLT 266 240   Basic Metabolic Panel: Recent Labs  Lab 10/15/19 1354 10/16/19 0507  NA 133* 138  K 3.5 3.4*  CL 95* 99  CO2 25 27  GLUCOSE 123* 92  BUN 11 10  CREATININE 0.79 0.74  CALCIUM 8.8* 8.9   GFR: Estimated Creatinine Clearance: 132.8 mL/min (by C-G formula based on SCr of 0.74 mg/dL). Liver Function Tests: Recent Labs  Lab 10/15/19 1354 10/16/19 0507  AST 24 19  ALT 23 18  ALKPHOS 182* 164*  BILITOT 1.4* 1.0  PROT 8.2* 7.6  ALBUMIN 2.9* 2.7*   No results for input(s): LIPASE, AMYLASE in the last 168 hours. No results for input(s): AMMONIA in the last 168 hours. Coagulation Profile: No results for input(s): INR, PROTIME in the last 168 hours. Cardiac Enzymes: No results for input(s): CKTOTAL, CKMB,  CKMBINDEX, TROPONINI in the last 168 hours. BNP (last 3 results) Recent Labs    04/23/19 1048  PROBNP 505.0*   HbA1C: No results for input(s): HGBA1C in the last 72 hours. CBG: No results for input(s): GLUCAP in the last 168 hours. Lipid Profile: No results for input(s): CHOL, HDL, LDLCALC, TRIG, CHOLHDL, LDLDIRECT in the last 72 hours. Thyroid Function Tests: No results for input(s): TSH, T4TOTAL, FREET4, T3FREE, THYROIDAB in the last 72 hours. Anemia Panel: No results for input(s): VITAMINB12, FOLATE, FERRITIN, TIBC, IRON, RETICCTPCT in the last 72 hours. Urine analysis: No results found for: COLORURINE, APPEARANCEUR, LABSPEC, PHURINE, GLUCOSEU, HGBUR, BILIRUBINUR, KETONESUR, PROTEINUR, UROBILINOGEN, NITRITE, LEUKOCYTESUR Sepsis Labs: @LABRCNTIP (procalcitonin:4,lacticacidven:4)  ) Recent Results (from the past 240 hour(s))  Blood culture (routine x 2)     Status: None (Preliminary result)   Collection Time: 10/15/19  3:16 PM   Specimen: BLOOD RIGHT HAND  Result Value Ref Range Status   Specimen Description   Final    BLOOD RIGHT HAND Performed at [facility], [address] [place], [place] [address]    Special Requests   Final    BOTTLES DRAWN AEROBIC AND ANAEROBIC Blood Culture adequate volume Performed at [facility] [address], [place], [place] [address]    Culture   Final    NO GROWTH < 24 HOURS Performed at [facility] [address] [place], [place] [address]    Report Status PENDING  Incomplete  Blood culture (routine x 2)     Status: None (Preliminary result)   Collection Time: 10/15/19  3:16 PM   Specimen: BLOOD LEFT HAND  Result Value Ref Range Status   Specimen Description   Final    BLOOD LEFT HAND Performed at [facility] [address], [place], [place] [address]    Special Requests   Final    BOTTLES DRAWN AEROBIC AND ANAEROBIC Blood Culture adequate volume Performed at  [facility] [address], [place], [place] [address]    Culture   Final    NO GROWTH < 24 HOURS Performed at [facility] [address], [place], [place] [address]    Report Status PENDING  Incomplete  SARS CORONAVIRUS 2 (TAT 6-24 HRS) Nasopharyngeal Nasopharyngeal Swab     Status: None   Collection Time: 10/15/19  3:16 PM   Specimen: Nasopharyngeal Swab  Result Value Ref Range Status   SARS Coronavirus 2 NEGATIVE NEGATIVE Final    Comment: (NOTE) SARS-CoV-2 target nucleic acids are NOT DETECTED. The SARS-CoV-2 RNA is generally detectable in upper and lower respiratory specimens during the acute phase of infection. Negative results do not preclude SARS-CoV-2 infection, do not rule out co-infections with other pathogens, and should not be used as the sole basis for treatment or other patient management decisions. Negative results must be combined with clinical observations, patient history, and epidemiological information. The expected result is Negative. Fact Sheet for Patients: SugarRoll.be Fact Sheet for Healthcare Providers: https://www.woods-mathews.com/ This test is not yet approved or cleared by the [place] FDA and  has been authorized for detection and/or diagnosis of SARS-CoV-2 by FDA under an Emergency Use Authorization (EUA). This EUA will remain  in effect (meaning this test can be used) for the duration of the COVID-19 declaration under Section 56 4(b)(1) of the Act, 21 U.S.C. section 360bbb-3(b)(1), unless the authorization is terminated or revoked sooner. Performed at [facility], [place] [address], [place], [place] [address]   Aerobic Culture (superficial specimen)     Status: None (Preliminary result)   Collection Time: 10/15/19  4:10 PM   Specimen: Wound  Result Value Ref Range Status   Specimen Description WOUND RIGHT FOOT  Final   Special Requests NONE  Final   Gram Stain NO  WBC SEEN NO ORGANISMS SEEN   Final   Culture   Final    TOO YOUNG TO READ Performed at [facility], [address] [place], [place] [address]    Report Status PENDING  Incomplete         Radiology Studies: DG Foot 2 Views Left  Result Date: 10/15/2019 CLINICAL DATA:  Bilateral painful ulcers. EXAM: LEFT FOOT - 2 VIEW COMPARISON:  Mar 14, 2008 FINDINGS: The patient is status post prior amputation through the third metatarsal. There are advanced degenerative changes at the first metatarsophalangeal joint. There is a small erosion at the first tarsometatarsal joint there is slight irregularity at the base of the fifth metatarsal. There is no  acute displaced fracture or dislocation. There is a moderate-sized plantar calcaneal spur. There is extensive soft tissue swelling about the forefoot. There is a plantar ulcer overlying the midfoot at the level of the metatarsal bases. IMPRESSION: 1. There is a plantar ulcer overlying the midfoot at the level of the metatarsal bases. 2. There is a small erosion at the first tarsometatarsal joint with slight irregularity at the base of the fifth metatarsal. Osteomyelitis cannot be excluded. 3. Soft tissue swelling of the forefoot. Electronically Signed   By: Katherine Mantle M.D.   On: 10/15/2019 18:37   DG Foot 2 Views Right  Result Date: 10/15/2019 CLINICAL DATA:  Plantar ulcers EXAM: RIGHT FOOT - 2 VIEW COMPARISON:  Feb 16, 2018 FINDINGS: The patient is status post amputation through the third metatarsal. There are advanced degenerative changes of the first metatarsophalangeal joint as well as the second metatarsophalangeal joint. There is a moderate-sized plantar calcaneal spur. There is mild soft tissue swelling about the forefoot. There is no acute displaced fracture. No dislocation. There is a likely old healed fracture of the proximal phalanx of the second digit. IMPRESSION: 1. No acute osseous abnormality. 2. Chronic findings as above. 3.  Mild soft tissue swelling about the forefoot. Electronically Signed   By: Katherine Mantle M.D.   On: 10/15/2019 18:39        Scheduled Meds: . docusate sodium  100 mg Oral BID  . DULoxetine  20 mg Oral Daily  . enoxaparin (LOVENOX) injection  40 mg Subcutaneous Q24H  . feeding supplement (PRO-STAT SUGAR FREE 64)  30 mL Oral BID  . furosemide  20 mg Intravenous BID  . levothyroxine  50 mcg Oral Q0600  . metoprolol tartrate  50 mg Oral BID  . multivitamin with minerals  1 tablet Oral Daily  . nutrition supplement (JUVEN)  1 packet Oral BID BM  . potassium chloride SA  40 mEq Oral BID  . thiamine  100 mg Oral Daily  . traZODone  50 mg Oral QHS   Continuous Infusions: . ampicillin-sulbactam (UNASYN) IV 3 g (10/16/19 1305)     LOS: 1 day    Time spent: 25    Alberteen Sam, MD [facility] 10/16/2019, 3:02 PM     Please page though AMION or Epic secure chat:  For [employer], Higher education careers adviser

## 2019-10-16 NOTE — Consult Note (Signed)
[facility] Nurse Consult Note: Reason for Consult:Paitent with history of lymphedema and diabetes presents with bilateral plantar foot ulcers.  Cellulitis to bilateral lower legs as well with noted erythema and tenderness. Appointment with Dr Aleda Grana revealed that he has worn unna boots and was ordered pneumatic compression in 05/2019.  Unclear if he has been able to comply with this.  Presently the left foot ulcer appears more significant than the right and an MRI is ordered this AM. Osteomyelitis is present in left foot. I will order topical wound care now but suspect orthopedics will be involved and that will supercede my recommendations  Wound type:Neuropathic Pressure Injury POA: Yes Wound FBP:ZWCH pink nongranulating with eschar present and callous present circumferentially.  Drainage (amount, consistency, odor) minimal serosanguinous  Periwound:Callous to periwound bilaterally Dressing procedure/placement/frequency:Cleanse wounds to bilateral plantar feet with NS and pat dry. Apply Aquacel Ag to wound bed. COver with dry dressing and kerlix.  Change Tuesday/Thursday/Saturday.  Will defer to orthopedics if consulted.  Once cellulitis/tenderness stabilizes, will consider compression for lower legs.    [facility] team will remain involved if further consultation is needed.   Domenic Moras MSN, RN, FNP-BC CWON Wound, Ostomy, Continence Nurse Pager [phone]

## 2019-10-16 NOTE — Progress Notes (Signed)
Initial Nutrition Assessment  DOCUMENTATION CODES:   Obesity unspecified  INTERVENTION:   -Prostat liquid protein PO 30 ml BID with meals, each supplement provides 100 kcal, 15 grams protein. -Juven Fruit Punch BID, each serving provides 95kcal and 2.5g of protein (amino acids glutamine and arginine)  NUTRITION DIAGNOSIS:   Increased nutrient needs related to wound healing as evidenced by estimated needs.  GOAL:   Patient will meet greater than or equal to 90% of their needs  MONITOR:   PO intake, Supplement acceptance, Labs, Weight trends, Skin, I & O's  REASON FOR ASSESSMENT:   Consult Assessment of nutrition requirement/status  ASSESSMENT:   58 y.o. male with history h/o HTN,, hyperlipidemia, diastolic CHF, chronic bilateral leg edema, peripheral neuropathy with chronic foot ulcers, liver cirrhosis, GERD, arthritis, polysubstance abuse (alcohol, marijuana), OSA not on CPAP but uses 2 L O2 at night, hypothyroidism presents with complaints of worsening foot ulcers with increasing size/purulent drainage.  Patient currently consuming 100% of meals at this time. Pt with increased needs from bilateral foot ulcers r/t lymphedema and diabetes.  RD will order Juven and Prostat supplements for additional protein to aid in wound healing.   Per weight records, pt has lost 29 lbs since 4/13 (10% wt loss x 8.5 months, insignificant for time frame).  Per nursing documentation, pt with moderate BLE edema.   I/Os: -690 ml since admit UOP: 2.2L x 24 hrs  Medications: IV Lasix, Multivitamin with minerals daily, KLOR-CON, Thiamine tablet Labs reviewed: Low K  NUTRITION - FOCUSED PHYSICAL EXAM:  Working remotely.  Diet Order:   Diet Order            Diet Heart Room service appropriate? Yes; Fluid consistency: Thin  Diet effective now              EDUCATION NEEDS:   No education needs have been identified at this time  Skin:  Skin Assessment: Skin Integrity Issues: Skin  Integrity Issues:: Diabetic Ulcer Diabetic Ulcer: right and left foot  Last BM:  12/28 -type 7  Height:   Ht Readings from Last 1 Encounters:  10/15/19 6\' 1"  (1.854 m)    Weight:   Wt Readings from Last 1 Encounters:  10/15/19 113.4 kg    Ideal Body Weight:  83.6 kg  BMI:  Body mass index is 32.98 kg/m.  Estimated Nutritional Needs:   Kcal:  6948-5462  Protein:  110-120g  Fluid:  2L/day  Clayton Bibles, MS, RD, LDN Inpatient Clinical Dietitian Pager: [phone] After Hours Pager: [phone]

## 2019-10-17 ENCOUNTER — Inpatient Hospital Stay (HOSPITAL_COMMUNITY): Payer: Medicare Other

## 2019-10-17 DIAGNOSIS — L03115 Cellulitis of right lower limb: Secondary | ICD-10-CM

## 2019-10-17 DIAGNOSIS — E11628 Type 2 diabetes mellitus with other skin complications: Secondary | ICD-10-CM

## 2019-10-17 DIAGNOSIS — L089 Local infection of the skin and subcutaneous tissue, unspecified: Secondary | ICD-10-CM

## 2019-10-17 LAB — COMPREHENSIVE METABOLIC PANEL
ALT: 19 U/L (ref 0–44)
AST: 21 U/L (ref 15–41)
Albumin: 2.7 g/dL — ABNORMAL LOW (ref 3.5–5.0)
Alkaline Phosphatase: 197 U/L — ABNORMAL HIGH (ref 38–126)
Anion gap: 10 (ref 5–15)
BUN: 18 mg/dL (ref 6–20)
CO2: 26 mmol/L (ref 22–32)
Calcium: 9.1 mg/dL (ref 8.9–10.3)
Chloride: 101 mmol/L (ref 98–111)
Creatinine, Ser: 0.76 mg/dL (ref 0.61–1.24)
GFR calc Af Amer: >60 mL/min (ref >60)
GFR calc non Af Amer: >60 mL/min (ref >60)
Glucose, Bld: 145 mg/dL — ABNORMAL HIGH (ref 70–99)
Potassium: 3.9 mmol/L (ref 3.5–5.1)
Sodium: 137 mmol/L (ref 135–145)
Total Bilirubin: 0.7 mg/dL (ref 0.3–1.2)
Total Protein: 7.5 g/dL (ref 6.5–8.1)

## 2019-10-17 LAB — CBC
HCT: 36 % — ABNORMAL LOW (ref 39.0–52.0)
Hemoglobin: 11.4 g/dL — ABNORMAL LOW (ref 13.0–17.0)
MCH: 30.2 pg (ref 26.0–34.0)
MCHC: 31.7 g/dL (ref 30.0–36.0)
MCV: 95.5 fL (ref 80.0–100.0)
Platelets: 291 10*3/uL (ref 150–400)
RBC: 3.77 MIL/uL — ABNORMAL LOW (ref 4.22–5.81)
RDW: 14.1 % (ref 11.5–15.5)
WBC: 7.4 10*3/uL (ref 4.0–10.5)
nRBC: 0 % (ref 0.0–0.2)

## 2019-10-17 LAB — HEMOGLOBIN A1C
Hgb A1c MFr Bld: 5.3 % (ref 4.8–5.6)
Mean Plasma Glucose: 105.41 mg/dL

## 2019-10-17 MED ORDER — SACCHAROMYCES BOULARDII 250 MG PO CAPS
250.0000 mg | ORAL_CAPSULE | Freq: Two times a day (BID) | ORAL | Status: DC
  Administered 2019-10-17 – 2019-10-18 (×2): 250 mg via ORAL
  Filled 2019-10-17 (×2): qty 1

## 2019-10-17 NOTE — Consult Note (Signed)
   [facility] CM Inpatient Consult   10/17/2019  [name] [birth year] [id number]     Patient screened for potential needs of [facility] Care Management services related to 23% high risk score for unplanned readmission and hospitalization under his Medicare/ [employer] ACO plan.  Patient had several outreaches from [facility], however, unsuccessful due to inability to establish contact with him.   Chart review and MD brief narrative reveals as: 59 y.o.Mwith dCHF, DM diet controlled with neuropathy, HTN,OSA on nocturnal oxygen not CPAP, MO, chronic venous insufficiency, former alcohol use, and hypothyroidism, who presented with worsening foot ulcers. (Cellulitis- Diabetic foot ulcers).  His primary care provider is Dr. Billey Gosling with [place], listed to provide transition of care follow-up.  Current discharge disposition: Uncertain at this time.   Plan: Will continue to follow progress, needs and recommendations for disposition with Inpatient transition of care team.  Of note, [facility] Care Management services does not replace or interfere with any services that are arranged by inpatient case management or social work.   For questions and referral, please contact:  Maryetta Shafer A. Ruford Dudzinski, BSN, RN-BC [facility] Liaison Cell: [phone]

## 2019-10-17 NOTE — Progress Notes (Signed)
PROGRESS NOTE    [name]  CHE:[id number] DOB: 18-Mar-1961 DOA: 10/15/2019 PCP: [name], MD    Brief Narrative:  58 y.o. M with dCHF, DM diet controlled with neuropathy, HTN, OSA on nocturnal oxygen not CPAP, MO, chronic venous insufficiency, former alcohol use, and hypothyroidism who presented with worsening foot ulcers.  Patient is chronic venous insufficiency, followed by [facility] for Unna boots, and wound care.  Patient's chronic left foot ulcer had improved dramatically by around 1 month ago, but over the last several weeks, he has been on his feet more often, and noticed a recurrence of the foot wounds, purulent drainage.  This progressed until the day of admission, was more painful, with extensive redness spreading on the legs.  In the ER, afebrile, heart rate 116, WBC 12 K.  He was started on Unasyn in the hospital service were asked to evaluate for admission.  Assessment & Plan:   Principal Problem:   Cellulitis Active Problems:   Hypertension   Neuropathy   Morbid obesity (HCC)   OSA (obstructive sleep apnea)   Hyperlipidemia   Marijuana abuse   Bilateral leg edema   Hypothyroidism   Severe alcohol use disorder (HCC)   Alcoholic cirrhosis of liver without ascites (HCC)   Obesity hypoventilation syndrome (HCC)   Chronic diastolic CHF (congestive heart failure) (HCC)   Chronic foot ulcer (HCC)   SIRS (systemic inflammatory response syndrome) (HCC)  Diabetic foot ulcers Abnormal left foot radiograph Radiographs of the feet showed no findings on the right, but "small erosion at the first tarsometatarsal joint with slight irregularity at the base of the fifth metatarsal. Osteomyelitis cannot be excluded."  ABIs earlier this year normal. -Pt continued on Unasyn -MRI foot personally reviewed with findings consistent with edema secondary to degenerative disease and stress change rather than osteo -WOC consulted and is following -Nutrition  consulted -Compression hose when cellulitis resolved  Right thigh lump Redness in the right thigh is spreading around a firm painful lump in the anterior thigh. -[place] of thigh personally reviewed. Findings suggesting 3cm abscess noted -Have discussed case with General Surgery, appreciate recs. Recommendation to continue current abx regimen and re-evaluate in AM. If no significant change, then consideration for I/D  Chronic diastolic CHF Swelling appears improved. -Recently stopped IV Lasix and resumed home oral Lasix -Continue potassium as tolerated  Venous insufficiency -Compression hose when cellulitis resolved  Diabetes with neuropathy Glucoses well controlled with diet. -A1c noted to be 5.3 -Continue Cymbalta  Hypertension Blood pressure well controlled -Continue metoprolol as tolerated  Sleep apnea -continue with Oxygen at night  Alcohol use Quit completely 9 months ago -Continue thiamine and multivitamin -Continue trazodone at night  Hypothyroidism -Continue levothyroxine as tolerated  Anemia, normocytic Likely from chronic disease  DVT prophylaxis: Lovenox subq Code Status: Full Family Communication: Pt in room, family not at bedside Disposition Plan: Uncertain at this time  Consultants:   General Surgery  Procedures:     Antimicrobials: Anti-infectives (From admission, onward)   Start     Dose/Rate Route Frequency Ordered Stop   10/15/19 1800  Ampicillin-Sulbactam (UNASYN) 3 g in sodium chloride 0.9 % 100 mL IVPB     3 g 200 mL/hr over 30 Minutes Intravenous Every 6 hours 10/15/19 1625     10/15/19 1500  vancomycin (VANCOCIN) 2,500 mg in sodium chloride 0.9 % 500 mL IVPB     2,500 mg 250 mL/hr over 120 Minutes Intravenous  Once 10/15/19 1433 10/15/19 1812  10/15/19 1445  vancomycin (VANCOCIN) IVPB 1000 mg/200 mL premix  Status:  Discontinued     1,000 mg 200 mL/hr over 60 Minutes Intravenous  Once 10/15/19 1431 10/15/19 1433   10/15/19  1445  cefTRIAXone (ROCEPHIN) 2 g in sodium chloride 0.9 % 100 mL IVPB     2 g 200 mL/hr over 30 Minutes Intravenous  Once 10/15/19 1431 10/15/19 1611       Subjective: Reports R thigh erythema seems improved  Objective: Vitals:   10/16/19 0616 10/16/19 1440 10/16/19 2144 10/17/19 0548  BP: 124/70 131/78 (!) 142/76 131/71  Pulse: 83 77 81 79  Resp: Temp: 98 F (36.7 C) (!) 97.5 F (36.4 C) 98.2 F (36.8 C) 98.2 F (36.8 C)  TempSrc: Oral Oral Oral Oral  SpO2: 95% 99% 99% 94%  Weight:      Height:        Intake/Output Summary (Last 24 hours) at 10/17/2019 1523 Last data filed at 10/17/2019 1459 Gross per 24 hour  Intake 1120 ml  Output 2675 ml  Net -1555 ml   Filed Weights   10/15/19 1347  Weight: 113.4 kg    Examination:  General exam: Appears calm and comfortable  Respiratory system: Clear to auscultation. Respiratory effort normal. Cardiovascular system: S1 & S2 heard, Regular Gastrointestinal system: Abdomen is nondistended, soft and nontender. No organomegaly or masses felt. Normal bowel sounds heard. Central nervous system: Alert and oriented. No focal neurological deficits. Extremities: Symmetric 5 x 5 power. Skin: No rashes, R thigh erythema Psychiatry: Judgement and insight appear normal. Mood & affect appropriate.   Data Reviewed: I have personally reviewed following labs and imaging studies  CBC: Recent Labs  Lab 10/15/19 1354 10/16/19 0507 10/17/19 0529  WBC 12.1* 7.5 7.4  NEUTROABS 9.9*  --   --   HGB 11.9* 10.8* 11.4*  HCT 36.2* 33.7* 36.0*  MCV 92.6 93.9 95.5  PLT 266 240 291   Basic Metabolic Panel: Recent Labs  Lab 10/15/19 1354 10/16/19 0507 10/17/19 0529  NA 133* 138 137  K 3.5 3.4* 3.9  CL 95* 99 101  CO2 GLUCOSE 123* 92 145*  BUN CREATININE 0.79 0.74 0.76  CALCIUM 8.8* 8.9 9.1   GFR: Estimated Creatinine Clearance: 132.8 mL/min (by C-G formula based on SCr of 0.76 mg/dL). Liver  Function Tests: Recent Labs  Lab 10/15/19 1354 10/16/19 0507 10/17/19 0529  AST ALT ALKPHOS 182* 164* 197*  BILITOT 1.4* 1.0 0.7  PROT 8.2* 7.6 7.5  ALBUMIN 2.9* 2.7* 2.7*   No results for input(s): LIPASE, AMYLASE in the last 168 hours. No results for input(s): AMMONIA in the last 168 hours. Coagulation Profile: No results for input(s): INR, PROTIME in the last 168 hours. Cardiac Enzymes: No results for input(s): CKTOTAL, CKMB, CKMBINDEX, TROPONINI in the last 168 hours. BNP (last 3 results) Recent Labs    04/23/19 1048  PROBNP 505.0*   HbA1C: Recent Labs    10/17/19 0529  HGBA1C 5.3   CBG: No results for input(s): GLUCAP in the last 168 hours. Lipid Profile: No results for input(s): CHOL, HDL, LDLCALC, TRIG, CHOLHDL, LDLDIRECT in the last 72 hours. Thyroid Function Tests: No results for input(s): TSH, T4TOTAL, FREET4, T3FREE, THYROIDAB in the last 72 hours. Anemia Panel: No results for input(s): VITAMINB12, FOLATE, FERRITIN, TIBC, IRON, RETICCTPCT in the last 72 hours. Sepsis Labs: Recent Labs  Lab  10/15/19 1431  LATICACIDVEN 1.4    Recent Results (from the past 240 hour(s))  Blood culture (routine x 2)     Status: None (Preliminary result)   Collection Time: 10/15/19  3:16 PM   Specimen: BLOOD RIGHT HAND  Result Value Ref Range Status   Specimen Description   Final    BLOOD RIGHT HAND Performed at [facility], [address] [place], [place] [address]    Special Requests   Final    BOTTLES DRAWN AEROBIC AND ANAEROBIC Blood Culture adequate volume Performed at [facility], [address] [place], [place] [address]    Culture   Final    NO GROWTH 2 DAYS Performed at [facility], [address] [place], [place] [address]    Report Status PENDING  Incomplete  Blood culture (routine x 2)     Status: None (Preliminary result)   Collection Time: 10/15/19  3:16 PM   Specimen: BLOOD LEFT  HAND  Result Value Ref Range Status   Specimen Description   Final    BLOOD LEFT HAND Performed at [facility], [address] [place], [place] [address]    Special Requests   Final    BOTTLES DRAWN AEROBIC AND ANAEROBIC Blood Culture adequate volume Performed at [facility], [address] [place], [place] [address]    Culture   Final    NO GROWTH 2 DAYS Performed at [facility], [address] [place], [place] [address]    Report Status PENDING  Incomplete  SARS CORONAVIRUS 2 (TAT 6-24 HRS) Nasopharyngeal Nasopharyngeal Swab     Status: None   Collection Time: 10/15/19  3:16 PM   Specimen: Nasopharyngeal Swab  Result Value Ref Range Status   SARS Coronavirus 2 NEGATIVE NEGATIVE Final    Comment: (NOTE) SARS-CoV-2 target nucleic acids are NOT DETECTED. The SARS-CoV-2 RNA is generally detectable in upper and lower respiratory specimens during the acute phase of infection. Negative results do not preclude SARS-CoV-2 infection, do not rule out co-infections with other pathogens, and should not be used as the sole basis for treatment or other patient management decisions. Negative results must be combined with clinical observations, patient history, and epidemiological information. The expected result is Negative. Fact Sheet for Patients: HairSlick.nohttps://www.fda.gov/media/138098/download Fact Sheet for Healthcare Providers: quierodirigir.comhttps://www.fda.gov/media/138095/download This test is not yet approved or cleared by the [place] FDA and  has been authorized for detection and/or diagnosis of SARS-CoV-2 by FDA under an Emergency Use Authorization (EUA). This EUA will remain  in effect (meaning this test can be used) for the duration of the COVID-19 declaration under Section 56 4(b)(1) of the Act, 21 U.S.C. section 360bbb-3(b)(1), unless the authorization is terminated or revoked sooner. Performed at [facility], [address]  [place], [place] [address]   Aerobic Culture (superficial specimen)     Status: None (Preliminary result)   Collection Time: 10/15/19  4:10 PM   Specimen: Wound  Result Value Ref Range Status   Specimen Description WOUND RIGHT FOOT  Final   Special Requests NONE  Final   Gram Stain NO WBC SEEN NO ORGANISMS SEEN   Final   Culture   Final    CULTURE REINCUBATED FOR BETTER GROWTH Performed at [facility], [address] [place], [place] [address]    Report Status PENDING  Incomplete     Radiology Studies: MR FOOT LEFT WO CONTRAST  Result Date:  10/16/2019 CLINICAL DATA:  Patient with skin ulcerations on the left foot. Question osteomyelitis. EXAM: MRI OF THE LEFT FOOT WITHOUT CONTRAST TECHNIQUE: Multiplanar, multisequence MR imaging of the left foot was performed. No intravenous contrast was administered. COMPARISON:  Plain films left foot 10/15/2019. FINDINGS: Bones/Joint/Cartilage A small focus of mild marrow edema is seen in the dorsal margin of the medial cuneiform at its articulation with the navicular. There is low level edema about the second through fifth tarsometatarsal joints. Degenerative disease is present about these joints with joint space narrowing and osteophytosis. The patient has a hallux valgus deformity with joint space narrowing and osteophytosis at the first MTP joint and a small subchondral cyst in the first metatarsal head. Bone marrow signal is otherwise unremarkable. The patient is status post amputation at the level of the distal diaphysis of the third metatarsal. The first and second toes are crossing. Ligaments Intact. Muscles and Tendons No intramuscular fluid collection. There is atrophy of intrinsic musculature the foot. Soft tissues Subcutaneous edema is seen over the dorsum of the foot. Tiny synovial or ganglion cysts are seen over the dorsal margins of the talonavicular joint and articulation of the navicular and middle cuneiform. Soft tissues are otherwise  unremarkable. IMPRESSION: Low level marrow edema about the second through fifth TMT joints and dorsal margin of the medial cuneiform at its articulation with the navicular has an appearance most consistent with edema secondary to degenerative disease and stress change rather than osteomyelitis. Hallux valgus and first MTP osteoarthritis. Subcutaneous edema over the dorsum of the foot could be due to dependent change or cellulitis. No abscess is identified. Electronically Signed   By: Inge Rise M.D.   On: 10/16/2019 10:57   DG Foot 2 Views Left  Result Date: 10/15/2019 CLINICAL DATA:  Bilateral painful ulcers. EXAM: LEFT FOOT - 2 VIEW COMPARISON:  Mar 14, 2008 FINDINGS: The patient is status post prior amputation through the third metatarsal. There are advanced degenerative changes at the first metatarsophalangeal joint. There is a small erosion at the first tarsometatarsal joint there is slight irregularity at the base of the fifth metatarsal. There is no acute displaced fracture or dislocation. There is a moderate-sized plantar calcaneal spur. There is extensive soft tissue swelling about the forefoot. There is a plantar ulcer overlying the midfoot at the level of the metatarsal bases. IMPRESSION: 1. There is a plantar ulcer overlying the midfoot at the level of the metatarsal bases. 2. There is a small erosion at the first tarsometatarsal joint with slight irregularity at the base of the fifth metatarsal. Osteomyelitis cannot be excluded. 3. Soft tissue swelling of the forefoot. Electronically Signed   By: Constance Holster M.D.   On: 10/15/2019 18:37   DG Foot 2 Views Right  Result Date: 10/15/2019 CLINICAL DATA:  Plantar ulcers EXAM: RIGHT FOOT - 2 VIEW COMPARISON:  Feb 16, 2018 FINDINGS: The patient is status post amputation through the third metatarsal. There are advanced degenerative changes of the first metatarsophalangeal joint as well as the second metatarsophalangeal joint. There is a  moderate-sized plantar calcaneal spur. There is mild soft tissue swelling about the forefoot. There is no acute displaced fracture. No dislocation. There is a likely old healed fracture of the proximal phalanx of the second digit. IMPRESSION: 1. No acute osseous abnormality. 2. Chronic findings as above. 3. Mild soft tissue swelling about the forefoot. Electronically Signed   By: Constance Holster M.D.   On: 10/15/2019 18:39   [place] RT LOWER EXTREM  LTD SOFT TISSUE NON VASCULAR  Result Date: 10/17/2019 CLINICAL DATA:  Lump of thigh on the right EXAM: ULTRASOUND RIGHT LOWER EXTREMITY LIMITED TECHNIQUE: Ultrasound examination of the lower extremity soft tissues was performed in the area of clinical concern. COMPARISON:  None. FINDINGS: Imaging performed over the right thigh. Generalized subcutaneous edema with bands of reticulation between the fat lobules. Along the deep subcutaneous and muscular layers is a hypoechoic avascular mildly complex collection which measures up to 3 cm. IMPRESSION: Deep subcutaneous collection measuring up to 3 cm with regional subcutaneous edema. Assuming there is regional cellulitis findings are most suggestive of abscess. Electronically Signed   By: Marnee Spring M.D.   On: 10/17/2019 08:04    Scheduled Meds: . docusate sodium  100 mg Oral BID  . DULoxetine  20 mg Oral Daily  . enoxaparin (LOVENOX) injection  40 mg Subcutaneous Q24H  . feeding supplement (PRO-STAT SUGAR FREE 64)  30 mL Oral BID  . furosemide  40 mg Oral BID  . levothyroxine  50 mcg Oral Q0600  . metoprolol tartrate  50 mg Oral BID  . multivitamin with minerals  1 tablet Oral Daily  . nutrition supplement (JUVEN)  1 packet Oral BID BM  . potassium chloride SA  20 mEq Oral BID  . thiamine  100 mg Oral Daily  . traZODone  50 mg Oral QHS   Continuous Infusions: . ampicillin-sulbactam (UNASYN) IV 3 g (10/17/19 1456)     LOS: 2 days   Rickey Barbara, MD [facility] Pager On Amion  If  7PM-7AM, please contact night-coverage 10/17/2019, 3:23 PM

## 2019-10-17 NOTE — Consult Note (Signed)
[facility] Consult/Admission Note  [name] 10-30-1960  [id number].    Requesting Provider: Dr. Rhona Leavens Chief Complaint/Reason for Consult: R thigh cellulitis/abscess  HPI:  Pt is a 58 yo male with a h/o dCHF, DM diet controlled with neuropathy, HTN, OSA on nocturnal oxygen not CPAP, MO, chronic venous insufficiency, former alcohol use, and hypothyroidism who presented to the ED with worsening foot ulcers. Patient states a few days before Christmas, he noticed mild pain and redness to his R inner/top of thigh. This progressively worsened to more redness and pain. He states the redness is much improved from yesterday. He is having less pain since admission. A few weeks back he noticed a nickel size bump on his R thigh that resolved in a few days. No injury or boil noted to this area. US showed deep subcutaneous collection measuring up to 3 cm with regional subcutaneous edema. We were asked to see.   ROS:  Review of Systems  Constitutional: Negative for chills, diaphoresis and fever.  HENT: Negative for sore throat.   Respiratory: Negative for cough and shortness of breath.   Cardiovascular: Negative for chest pain.  Gastrointestinal: Negative for abdominal pain, blood in stool, constipation, diarrhea, nausea and vomiting.  Genitourinary: Negative for dysuria.  Skin: Negative for rash.       + for cellulitis of R upper thigh and foot ulcers  Neurological: Negative for dizziness and loss of consciousness.  All other systems reviewed and are negative.    Family History  Adopted: Yes  Family history unknown: Yes    Past Medical History:  Diagnosis Date  . Alcohol dependence (HCC)   . Arthritis    hips, hands  . Bilateral carpal tunnel syndrome   . Bilateral leg edema    Chronic  . Diabetes mellitus without complication (HCC)    type 2  . Diverticulitis    portion of colon removed  . DOE (dyspnea on exertion)    occ  . Elevated liver enzymes   . Fatty liver    . GERD (gastroesophageal reflux disease)    occ  . Hammer toe   . Hip pain   . History of ventral hernia repair 2016   x2  . Hyperlipidemia    pt unsure  . Hypertension   . Marijuana abuse   . Morbid obesity (HCC)   . Neuromuscular disorder (HCC)    peripheral neuropathy feet and few fingers  . OSA (obstructive sleep apnea)    has OSA-not used CPAP 2-3 yrs could not tolerate cpap  . PONV (postoperative nausea and vomiting)   . Toe ulcer (HCC)    left healed    Past Surgical History:  Procedure Laterality Date  . AMPUTATION TOE Left 05/25/2018   Procedure: AMPUTATION TOE left 3rd;  Surgeon: Toni Arthurs, MD;  Location: [facility];  Service: Orthopedics;  Laterality: Left;  , to follow  . AMPUTATION TOE Left 06/28/2018   Procedure: Left foot revision 3rd toe amputation including 3rd metatarsal;  Surgeon: Toni Arthurs, MD;  Location: [facility];  Service: Orthopedics;  Laterality: Left;   . BICEPS TENDON REPAIR Left 2014   Partial  . COLONOSCOPY    . HERNIA REPAIR  2016   ventral  . HIP CLOSED REDUCTION Right 09/01/2018   Procedure: CLOSED REDUCTION HIP;  Surgeon: Yolonda Kida, MD;  Location: [facility];  Service: Orthopedics;  Laterality: Right;  . JOINT REPLACEMENT     b/l knees   .  TOE AMPUTATION Right 09/2013  . TOTAL HIP ARTHROPLASTY Right 08/16/2018   Procedure: TOTAL HIP ARTHROPLASTY ANTERIOR APPROACH;  Surgeon: Samson Frederic, MD;  Location: [facility];  Service: Orthopedics;  Laterality: Right;  . TOTAL HIP ARTHROPLASTY Left 11/02/2018   Procedure: TOTAL HIP ARTHROPLASTY ANTERIOR APPROACH;  Surgeon: Samson Frederic, MD;  Location: [facility];  Service: Orthopedics;  Laterality: Left;  . TOTAL KNEE ARTHROPLASTY     bilat    Social History:  reports that he has quit smoking. His smoking use included cigarettes. He has a 2.50 pack-year smoking history. He has never used smokeless tobacco. He reports current alcohol use. He reports  current drug use. Frequency: 1.00 time per week. Drug: Marijuana.  Allergies:  Allergies  Allergen Reactions  . Claritin [Loratadine] Shortness Of Breath and Anxiety    Medications Prior to Admission  Medication Sig Dispense Refill  . CANNABIDIOL PO Take 1 capsule by mouth 2 (two) times daily.    . diphenhydrAMINE (BENADRYL ALLERGY) 25 MG tablet Take 25 mg by mouth at bedtime as needed for allergies.     . DULoxetine (CYMBALTA) 20 MG capsule Take 1 capsule (20 mg total) by mouth daily. 90 capsule 0  . furosemide (LASIX) 40 MG tablet TAKE 1 TABLET (40 MG TOTAL) BY MOUTH 2 (TWO) TIMES DAILY. (Patient taking differently: Take 40 mg by mouth 2 (two) times daily. ) 60 tablet 0  . KLOR-CON M20 20 MEQ tablet TAKE 2 TABLETS (40 MEQ TOTAL) BY MOUTH 3 (THREE) TIMES DAILY. (Patient taking differently: Take 40 mEq by mouth 2 (two) times daily. ) 180 tablet 0  . levothyroxine (SYNTHROID) 50 MCG tablet Take 1 tablet (50 mcg total) by mouth daily. Need office visit for more refills. 30 tablet 0  . loperamide (IMODIUM A-D) 2 MG tablet Take 2 mg by mouth 4 (four) times daily as needed for diarrhea or loose stools.    . Multiple Vitamin (MULTIVITAMIN WITH MINERALS) TABS tablet Take 1 tablet by mouth daily. 30 tablet 0  . senna (SENOKOT) 8.6 MG TABS tablet Take 1 tablet (8.6 mg total) by mouth 2 (two) times daily. (Patient taking differently: Take 1 tablet by mouth daily as needed for mild constipation. ) 120 each 0  . thiamine 100 MG tablet TAKE 1 TABLET BY MOUTH EVERY DAY 100 tablet 0  . traZODone (DESYREL) 50 MG tablet TAKE 1-2 TABLETS (50-100 MG TOTAL) BY MOUTH AT BEDTIME AS NEEDED FOR SLEEP. (Patient taking differently: Take 50 mg by mouth at bedtime. ) 180 tablet 1  . aspirin 81 MG chewable tablet Chew 1 tablet (81 mg total) by mouth 2 (two) times daily. (Patient not taking: Reported on 10/15/2019) 60 tablet 1  . docusate sodium (COLACE) 100 MG capsule Take 1 capsule (100 mg total) by mouth 2 (two) times  daily. (Patient not taking: Reported on 10/15/2019) 60 capsule 1  . metoprolol tartrate (LOPRESSOR) 100 MG tablet TAKE 1 TABLET (100 MG TOTAL) BY MOUTH 2 (TWO) TIMES DAILY. (Patient not taking: Reported on 10/15/2019) 180 tablet 3    Blood pressure 131/71, pulse 79, temperature 98.2 F (36.8 C), temperature source Oral, resp. rate 18, height  (1.854 m), weight 113.4 kg, SpO2 94 %.  Physical Exam Vitals and nursing note reviewed.  Constitutional:      General: He is not in acute distress.    Appearance: Normal appearance. He is obese. He is not ill-appearing, toxic-appearing or diaphoretic.  HENT:     Head: Normocephalic and atraumatic.  Nose: Nose normal.     Mouth/Throat:     Lips: Pink.     Mouth: Mucous membranes are moist.     Pharynx: Oropharynx is clear.  Eyes:     General: Lids are normal.     Conjunctiva/sclera: Conjunctivae normal.     Pupils: Pupils are equal, round, and reactive to light.  Cardiovascular:     Rate and Rhythm: Normal rate.     Pulses:          Dorsalis pedis pulses are 2+ on the right side and 2+ on the left side.  Pulmonary:     Effort: Pulmonary effort is normal. No tachypnea or bradypnea.  Musculoskeletal:        General: Normal range of motion.     Cervical back: Full passive range of motion without pain and normal range of motion.     Right lower leg: No edema.     Left lower leg: Edema present.  Skin:    General: Skin is warm and dry.     Comments: venous stasis change to BLE, edema to LLE, ankle and down. Erythema to R medial and top of upper thigh with large area of induration roughly 8x8cm with very mild TTP. No area of fluctuance noted. Erythema appears improved when compared to photos taken yesterday.   Neurological:     Mental Status: He is alert and oriented to person, place, and time.  Psychiatric:        Mood and Affect: Mood normal.        Behavior: Behavior normal.     Results for orders placed or performed during the  hospital encounter of 10/15/19 (from the past 48 hour(s))  Lactic acid     Status: None   Collection Time: 10/15/19  2:31 PM  Result Value Ref Range   Lactic Acid, Venous 1.4 0.5 - 1.9 mmol/L    Comment: Performed at [facility], [place] [address], [place], [place] [address]  Blood culture (routine x 2)     Status: None (Preliminary result)   Collection Time: 10/15/19  3:16 PM   Specimen: BLOOD RIGHT HAND  Result Value Ref Range   Specimen Description      BLOOD RIGHT HAND Performed at [facility] [address], [place], [place] [address]    Special Requests      BOTTLES DRAWN AEROBIC AND ANAEROBIC Blood Culture adequate volume Performed at [facility] [address] [place], [place] [address]    Culture      NO GROWTH 2 DAYS Performed at [facility], [place] [address], [place], [place] [address]    Report Status PENDING   Blood culture (routine x 2)     Status: None (Preliminary result)   Collection Time: 10/15/19  3:16 PM   Specimen: BLOOD LEFT HAND  Result Value Ref Range   Specimen Description      BLOOD LEFT HAND Performed at [facility] [address] [place], [place] [address]    Special Requests      BOTTLES DRAWN AEROBIC AND ANAEROBIC Blood Culture adequate volume Performed at [facility] [address], [place], [place] [address]    Culture      NO GROWTH 2 DAYS Performed at [facility], [place] [address], [place], [place] [address]    Report Status PENDING   SARS CORONAVIRUS 2 (TAT 6-24 HRS) Nasopharyngeal Nasopharyngeal Swab  Status: None   Collection Time: 10/15/19  3:16 PM   Specimen: Nasopharyngeal Swab  Result Value Ref Range   SARS Coronavirus 2 NEGATIVE NEGATIVE    Comment: (NOTE) SARS-CoV-2 target nucleic acids are NOT DETECTED. The SARS-CoV-2 RNA is generally detectable in upper and lower respiratory specimens during the acute phase of  infection. Negative results do not preclude SARS-CoV-2 infection, do not rule out co-infections with other pathogens, and should not be used as the sole basis for treatment or other patient management decisions. Negative results must be combined with clinical observations, patient history, and epidemiological information. The expected result is Negative. Fact Sheet for Patients: HairSlick.no Fact Sheet for Healthcare Providers: quierodirigir.com This test is not yet approved or cleared by the [place] FDA and  has been authorized for detection and/or diagnosis of SARS-CoV-2 by FDA under an Emergency Use Authorization (EUA). This EUA will remain  in effect (meaning this test can be used) for the duration of the COVID-19 declaration under Section 56 4(b)(1) of the Act, 21 U.S.C. section 360bbb-3(b)(1), unless the authorization is terminated or revoked sooner. Performed at [facility], [address], [place], [place] [address]   Aerobic Culture (superficial specimen)     Status: None (Preliminary result)   Collection Time: 10/15/19  4:10 PM   Specimen: Wound  Result Value Ref Range   Specimen Description WOUND RIGHT FOOT    Special Requests NONE    Gram Stain NO WBC SEEN NO ORGANISMS SEEN     Culture      CULTURE REINCUBATED FOR BETTER GROWTH Performed at [facility], [address], [place], [place] [address]    Report Status PENDING   Urine rapid drug screen (hosp performed)     Status: None   Collection Time: 10/15/19  4:18 PM  Result Value Ref Range   Opiates NONE DETECTED NONE DETECTED   Cocaine NONE DETECTED NONE DETECTED   Benzodiazepines NONE DETECTED NONE DETECTED   Amphetamines NONE DETECTED NONE DETECTED   Tetrahydrocannabinol NONE DETECTED NONE DETECTED   Barbiturates NONE DETECTED NONE DETECTED    Comment: (NOTE) DRUG SCREEN FOR MEDICAL PURPOSES ONLY.  IF CONFIRMATION IS NEEDED FOR ANY  PURPOSE, NOTIFY LAB WITHIN 5 DAYS. LOWEST DETECTABLE LIMITS FOR URINE DRUG SCREEN Drug Class                     Cutoff (ng/mL) Amphetamine and metabolites    1000 Barbiturate and metabolites    200 Benzodiazepine                 200 Tricyclics and metabolites     300 Opiates and metabolites        300 Cocaine and metabolites        300 THC                            50 Performed at [facility], [address], [place], [place] [address]   Comprehensive metabolic panel     Status: Abnormal   Collection Time: 10/16/19  5:07 AM  Result Value Ref Range   Sodium 138 135 - 145 mmol/L   Potassium 3.4 (L) 3.5 - 5.1 mmol/L   Chloride 99 98 - 111 mmol/L   CO2 27 22 - 32 mmol/L   Glucose, Bld 92 70 - 99 mg/dL   BUN 10 6 - 20 mg/dL   Creatinine, Ser 5.42 0.61 -  1.24 mg/dL   Calcium 8.9 8.9 - 82.9 mg/dL   Total Protein 7.6 6.5 - 8.1 g/dL   Albumin 2.7 (L) 3.5 - 5.0 g/dL   AST 19 15 - 41 U/L   ALT 18 0 - 44 U/L   Alkaline Phosphatase 164 (H) 38 - 126 U/L   Total Bilirubin 1.0 0.3 - 1.2 mg/dL   GFR calc non Af Amer >60 >60 mL/min   GFR calc Af Amer >60 >60 mL/min   Anion gap 12 5 - 15    Comment: Performed at [facility], [address], [place], [place] [address]  CBC     Status: Abnormal   Collection Time: 10/16/19  5:07 AM  Result Value Ref Range   WBC 7.5 4.0 - 10.5 K/uL   RBC 3.59 (L) 4.22 - 5.81 MIL/uL   Hemoglobin 10.8 (L) 13.0 - 17.0 g/dL   HCT 08.6 (L) 57.8 - 46.9 %   MCV 93.9 80.0 - 100.0 fL   MCH 30.1 26.0 - 34.0 pg   MCHC 32.0 30.0 - 36.0 g/dL   RDW 62.9 52.8 - 41.3 %   Platelets 240 150 - 400 K/uL   nRBC 0.0 0.0 - 0.2 %    Comment: Performed at [facility], [address], [place], [place] [address]  Hemoglobin A1c     Status: None   Collection Time: 10/17/19  5:29 AM  Result Value Ref Range   Hgb A1c MFr Bld 5.3 4.8 - 5.6 %    Comment: (NOTE) Pre diabetes:          5.7%-6.4% Diabetes:               >6.4% Glycemic control for   <7.0% adults with diabetes    Mean Plasma Glucose 105.41 mg/dL    Comment: Performed at [facility], [address], [place], [place] [address]  CBC     Status: Abnormal   Collection Time: 10/17/19  5:29 AM  Result Value Ref Range   WBC 7.4 4.0 - 10.5 K/uL   RBC 3.77 (L) 4.22 - 5.81 MIL/uL   Hemoglobin 11.4 (L) 13.0 - 17.0 g/dL   HCT 36.6 (L) 44.0 - 34.7 %   MCV 95.5 80.0 - 100.0 fL   MCH 30.2 26.0 - 34.0 pg   MCHC 31.7 30.0 - 36.0 g/dL   RDW 42.5 95.6 - 38.7 %   Platelets 291 150 - 400 K/uL   nRBC 0.0 0.0 - 0.2 %    Comment: Performed at [facility], [address], [place], [place] [address]  Comprehensive metabolic panel     Status: Abnormal   Collection Time: 10/17/19  5:29 AM  Result Value Ref Range   Sodium 137 135 - 145 mmol/L   Potassium 3.9 3.5 - 5.1 mmol/L   Chloride 101 98 - 111 mmol/L   CO2 26 22 - 32 mmol/L   Glucose, Bld 145 (H) 70 - 99 mg/dL   BUN 18 6 - 20 mg/dL   Creatinine, Ser 2.95 0.61 - 1.24 mg/dL   Calcium 9.1 8.9 - 18.8 mg/dL   Total Protein 7.5 6.5 - 8.1 g/dL   Albumin 2.7 (L) 3.5 - 5.0 g/dL   AST 21 15 - 41 U/L   ALT 19 0 - 44 U/L   Alkaline Phosphatase 197 (H) 38 - 126 U/L   Total Bilirubin 0.7 0.3 - 1.2 mg/dL   GFR calc non Af Amer >60 >  60 mL/min   GFR calc Af Amer >60 >60 mL/min   Anion gap 10 5 - 15    Comment: Performed at [facility], [address] [place], [place] [address]   MR FOOT LEFT WO CONTRAST  Result Date: 10/16/2019 CLINICAL DATA:  Patient with skin ulcerations on the left foot. Question osteomyelitis. EXAM: MRI OF THE LEFT FOOT WITHOUT CONTRAST TECHNIQUE: Multiplanar, multisequence MR imaging of the left foot was performed. No intravenous contrast was administered. COMPARISON:  Plain films left foot 10/15/2019. FINDINGS: Bones/Joint/Cartilage A small focus of mild marrow edema is seen in the dorsal margin of the medial cuneiform at its articulation with  the navicular. There is low level edema about the second through fifth tarsometatarsal joints. Degenerative disease is present about these joints with joint space narrowing and osteophytosis. The patient has a hallux valgus deformity with joint space narrowing and osteophytosis at the first MTP joint and a small subchondral cyst in the first metatarsal head. Bone marrow signal is otherwise unremarkable. The patient is status post amputation at the level of the distal diaphysis of the third metatarsal. The first and second toes are crossing. Ligaments Intact. Muscles and Tendons No intramuscular fluid collection. There is atrophy of intrinsic musculature the foot. Soft tissues Subcutaneous edema is seen over the dorsum of the foot. Tiny synovial or ganglion cysts are seen over the dorsal margins of the talonavicular joint and articulation of the navicular and middle cuneiform. Soft tissues are otherwise unremarkable. IMPRESSION: Low level marrow edema about the second through fifth TMT joints and dorsal margin of the medial cuneiform at its articulation with the navicular has an appearance most consistent with edema secondary to degenerative disease and stress change rather than osteomyelitis. Hallux valgus and first MTP osteoarthritis. Subcutaneous edema over the dorsum of the foot could be due to dependent change or cellulitis. No abscess is identified. Electronically Signed   By: Drusilla Kanner M.D.   On: 10/16/2019 10:57   DG Foot 2 Views Left  Result Date: 10/15/2019 CLINICAL DATA:  Bilateral painful ulcers. EXAM: LEFT FOOT - 2 VIEW COMPARISON:  Mar 14, 2008 FINDINGS: The patient is status post prior amputation through the third metatarsal. There are advanced degenerative changes at the first metatarsophalangeal joint. There is a small erosion at the first tarsometatarsal joint there is slight irregularity at the base of the fifth metatarsal. There is no acute displaced fracture or dislocation. There is a  moderate-sized plantar calcaneal spur. There is extensive soft tissue swelling about the forefoot. There is a plantar ulcer overlying the midfoot at the level of the metatarsal bases. IMPRESSION: 1. There is a plantar ulcer overlying the midfoot at the level of the metatarsal bases. 2. There is a small erosion at the first tarsometatarsal joint with slight irregularity at the base of the fifth metatarsal. Osteomyelitis cannot be excluded. 3. Soft tissue swelling of the forefoot. Electronically Signed   By: Katherine Mantle M.D.   On: 10/15/2019 18:37   DG Foot 2 Views Right  Result Date: 10/15/2019 CLINICAL DATA:  Plantar ulcers EXAM: RIGHT FOOT - 2 VIEW COMPARISON:  Feb 16, 2018 FINDINGS: The patient is status post amputation through the third metatarsal. There are advanced degenerative changes of the first metatarsophalangeal joint as well as the second metatarsophalangeal joint. There is a moderate-sized plantar calcaneal spur. There is mild soft tissue swelling about the forefoot. There is no acute displaced fracture. No dislocation. There is a likely old healed fracture of  the proximal phalanx of the second digit. IMPRESSION: 1. No acute osseous abnormality. 2. Chronic findings as above. 3. Mild soft tissue swelling about the forefoot. Electronically Signed   By: Katherine Mantlehristopher  Green M.D.   On: 10/15/2019 18:39   US RT LOWER EXTREM LTD SOFT TISSUE NON VASCULAR  Result Date: 10/17/2019 CLINICAL DATA:  Lump of thigh on the right EXAM: ULTRASOUND RIGHT LOWER EXTREMITY LIMITED TECHNIQUE: Ultrasound examination of the lower extremity soft tissues was performed in the area of clinical concern. COMPARISON:  None. FINDINGS: Imaging performed over the right thigh. Generalized subcutaneous edema with bands of reticulation between the fat lobules. Along the deep subcutaneous and muscular layers is a hypoechoic avascular mildly complex collection which measures up to 3 cm. IMPRESSION: Deep subcutaneous collection  measuring up to 3 cm with regional subcutaneous edema. Assuming there is regional cellulitis findings are most suggestive of abscess. Electronically Signed   By: Marnee SpringJonathon  Watts M.D.   On: 10/17/2019 08:04      Assessment/Plan Principal Problem:   Cellulitis Active Problems:   Hypertension   Neuropathy   Morbid obesity (HCC)   OSA (obstructive sleep apnea)   Hyperlipidemia   Marijuana abuse   Bilateral leg edema   Hypothyroidism   Severe alcohol use disorder (HCC)   Alcoholic cirrhosis of liver without ascites (HCC)   Obesity hypoventilation syndrome (HCC)   Chronic diastolic CHF (congestive heart failure) (HCC)   Chronic foot ulcer (HCC)   SIRS (systemic inflammatory response syndrome) (HCC)  R thigh cellulitis and abscess - I could not appreciate an abscess on exam but there was a large area of induration. Because this area seems to be improved on antibiotics I would recommend continuing antibiotics and see if this abscess seen on US presents itself. Possibly more difficult to find in the large area of induration.  - we will evaluate again tomorrow - please page us with any changes in the interim    Jerre SimonJessica L Estefana Taylor, [facility] 10/17/2019, 2:13 PM Please see amion for pager for the following: M, T, W, & Friday 7:00am - 4:30pm Thursdays 7:00am -11:30am

## 2019-10-17 NOTE — Consult Note (Signed)
[facility] Nurse wound follow up Wound type:infectious plantar foot neuropathic wounds, bilaterally.  Negative for osteomyelitis per MRI findings.  MD notes indicate likely treatment course is antibiotics 24-48 hours then discharge home.  Patient would like to initiate compression he has at home with removable compression garments. He also has pneumatic compression pumps that he rarely uses.  Discussed the wounds calloused perimeter and need for paring of this devitalized tissue.  Discussed consult to wound care center.  He has been seen at foot center but likes the idea of wound center and possibly hyperbarics if he is an ideal candidate.  His father is a physician and states he thinks it would help.  While in-house, we will continue Aquacel Ag to wounds.  After discharge, he will resume his compression and would like to follow up with wound center.  Will not follow at this time.  Please re-consult if needed.  Domenic Moras MSN, RN, FNP-BC [facility] Wound, Ostomy, Continence Nurse Pager [phone]

## 2019-10-18 MED ORDER — AMOXICILLIN-POT CLAVULANATE 875-125 MG PO TABS: 1.0000 | ORAL_TABLET | Freq: Two times a day (BID) | ORAL | 0 refills | Status: AC

## 2019-10-18 MED ORDER — SACCHAROMYCES BOULARDII 250 MG PO CAPS: 250.0000 mg | ORAL_CAPSULE | Freq: Two times a day (BID) | ORAL | 0 refills | Status: AC

## 2019-10-18 MED ORDER — METOPROLOL TARTRATE 50 MG PO TABS: 50.0000 mg | ORAL_TABLET | Freq: Two times a day (BID) | ORAL | 0 refills | Status: DC

## 2019-10-18 NOTE — Care Management Important Message (Signed)
Important Message  Patient Details IM Letter given to Marney Doctor RN Case Manager to present to the Patient Name: [name] MRN: [id number] Date of Birth: 1961/07/27   Medicare Important Message Given:  Yes     Kerin Salen 10/18/2019, 10:00 AM

## 2019-10-18 NOTE — Progress Notes (Signed)
Subjective/Chief Complaint: Feels better   Objective: Vital signs in last 24 hours: Temp:  [97.5 F (36.4 C)-98.5 F (36.9 C)] 97.5 F (36.4 C) (12/31 0526) Pulse Rate:  [72-84] 72 (12/31 0526) Resp:  [20] 20 (12/30 1558) BP: (121-158)/(76-86) 158/86 (12/31 0526) SpO2:  [93 %-96 %] 94 % (12/31 0526) Last BM Date: 10/15/19  Intake/Output from previous day: 12/30 0701 - 12/31 0700 In: 1840 [P.O.:1440; IV Piggyback:400] Out: 3925 [Urine:3925] Intake/Output this shift: No intake/output data recorded.  right thigh cellulitis much improved, no real fluctuance or anything I can see to drain, not tender  Lab Results:  Recent Labs    10/16/19 0507 10/17/19 0529  WBC 7.5 7.4  HGB 10.8* 11.4*  HCT 33.7* 36.0*  PLT 240 291   BMET Recent Labs    10/16/19 0507 10/17/19 0529  NA 138 137  K 3.4* 3.9  CL 99 101  CO2 27 26  GLUCOSE 92 145*  BUN 10 18  CREATININE 0.74 0.76  CALCIUM 8.9 9.1   PT/INR No results for input(s): LABPROT, INR in the last 72 hours. ABG No results for input(s): PHART, HCO3 in the last 72 hours.  Invalid input(s): PCO2, PO2  Studies/Results: MR FOOT LEFT WO CONTRAST  Result Date: 10/16/2019 CLINICAL DATA:  Patient with skin ulcerations on the left foot. Question osteomyelitis. EXAM: MRI OF THE LEFT FOOT WITHOUT CONTRAST TECHNIQUE: Multiplanar, multisequence MR imaging of the left foot was performed. No intravenous contrast was administered. COMPARISON:  Plain films left foot 10/15/2019. FINDINGS: Bones/Joint/Cartilage A small focus of mild marrow edema is seen in the dorsal margin of the medial cuneiform at its articulation with the navicular. There is low level edema about the second through fifth tarsometatarsal joints. Degenerative disease is present about these joints with joint space narrowing and osteophytosis. The patient has a hallux valgus deformity with joint space narrowing and osteophytosis at the first MTP joint and a small  subchondral cyst in the first metatarsal head. Bone marrow signal is otherwise unremarkable. The patient is status post amputation at the level of the distal diaphysis of the third metatarsal. The first and second toes are crossing. Ligaments Intact. Muscles and Tendons No intramuscular fluid collection. There is atrophy of intrinsic musculature the foot. Soft tissues Subcutaneous edema is seen over the dorsum of the foot. Tiny synovial or ganglion cysts are seen over the dorsal margins of the talonavicular joint and articulation of the navicular and middle cuneiform. Soft tissues are otherwise unremarkable. IMPRESSION: Low level marrow edema about the second through fifth TMT joints and dorsal margin of the medial cuneiform at its articulation with the navicular has an appearance most consistent with edema secondary to degenerative disease and stress change rather than osteomyelitis. Hallux valgus and first MTP osteoarthritis. Subcutaneous edema over the dorsum of the foot could be due to dependent change or cellulitis. No abscess is identified. Electronically Signed   By: Drusilla Kanner M.D.   On: 10/16/2019 10:57   [place] RT LOWER EXTREM LTD SOFT TISSUE NON VASCULAR  Result Date: 10/17/2019 CLINICAL DATA:  Lump of thigh on the right EXAM: ULTRASOUND RIGHT LOWER EXTREMITY LIMITED TECHNIQUE: Ultrasound examination of the lower extremity soft tissues was performed in the area of clinical concern. COMPARISON:  None. FINDINGS: Imaging performed over the right thigh. Generalized subcutaneous edema with bands of reticulation between the fat lobules. Along the deep subcutaneous and muscular layers is a hypoechoic avascular mildly complex collection which measures up to 3 cm. IMPRESSION: Deep  subcutaneous collection measuring up to 3 cm with regional subcutaneous edema. Assuming there is regional cellulitis findings are most suggestive of abscess. Electronically Signed   By: Monte Fantasia M.D.   On: 10/17/2019 08:04     Anti-infectives: Anti-infectives (From admission, onward)   Start     Dose/Rate Route Frequency Ordered Stop   10/15/19 1800  Ampicillin-Sulbactam (UNASYN) 3 g in sodium chloride 0.9 % 100 mL IVPB     3 g 200 mL/hr over 30 Minutes Intravenous Every 6 hours 10/15/19 1625     10/15/19 1500  vancomycin (VANCOCIN) 2,500 mg in sodium chloride 0.9 % 500 mL IVPB     2,500 mg 250 mL/hr over 120 Minutes Intravenous  Once 10/15/19 1433 10/15/19 1812   10/15/19 1445  vancomycin (VANCOCIN) IVPB 1000 mg/200 mL premix  Status:  Discontinued     1,000 mg 200 mL/hr over 60 Minutes Intravenous  Once 10/15/19 1431 10/15/19 1433   10/15/19 1445  cefTRIAXone (ROCEPHIN) 2 g in sodium chloride 0.9 % 100 mL IVPB     2 g 200 mL/hr over 30 Minutes Intravenous  Once 10/15/19 1431 10/15/19 1611      Assessment/Plan: Right thigh cellulitis -I dont really see much to drain and he is much better today -I think reasonable to send home per medicine on oral abx -discussed if not better or worsens may need something   Rolm Bookbinder 10/18/2019

## 2019-10-18 NOTE — Discharge Summary (Addendum)
Physician Discharge Summary  [name] EAV:[id number] DOB: 1961/01/10 DOA: 10/15/2019  PCP: [name], MD  Admit date: 10/15/2019 Discharge date: 10/18/2019  Admitted From: Home Disposition:  Home  Recommendations for Outpatient Follow-up:  1. Follow up with PCP in 1-2 weeks 2. Follow up with General Surgery as needed  Discharge Condition:Stable CODE STATUS:Full Diet recommendation: Diabetic   Brief/Interim Summary: 58 y.o.Mwith dCHF, DM diet controlled with neuropathy, HTN,OSA on nocturnal oxygen not CPAP, MO, chronic venous insufficiency, former alcohol use, and hypothyroidism who presented with worsening foot ulcers.  Patient is chronic venous insufficiency, followed by [facility] for Unna boots, and wound care. Patient's chronic left foot ulcer had improved dramatically by around 1 month ago, but over the last several weeks, he has been on his feet more often, and noticed a recurrence of the foot wounds, purulent drainage. This progressed until the day of admission, was more painful, with extensive redness spreading on the legs.  In the ER, afebrile, heart rate 116, WBC 12 K.He was started on Unasyn in the hospital service were asked to evaluate for admission.  Discharge Diagnoses:  Principal Problem:   Cellulitis Active Problems:   Hypertension   Neuropathy   Morbid obesity (HCC)   OSA (obstructive sleep apnea)   Hyperlipidemia   Marijuana abuse   Bilateral leg edema   Hypothyroidism   Severe alcohol use disorder (HCC)   Alcoholic cirrhosis of liver without ascites (HCC)   Obesity hypoventilation syndrome (HCC)   Chronic diastolic CHF (congestive heart failure) (HCC)   Chronic foot ulcer (HCC)   SIRS (systemic inflammatory response syndrome) (HCC)  Diabetic foot ulcers Abnormal left foot radiograph Radiographs of the feet showed no findings on the right, but "small erosion at the first tarsometatarsal joint with slight irregularity  at the base of the fifth metatarsal. Osteomyelitis cannot be excluded."  ABIs earlier this year normal. -Pt continued onUnasyn -MRI foot personally reviewed with findings consistent with edema secondary to degenerative disease and stress change rather than osteo -WOC consulted, recs appreciated -Nutrition consulted -Compression hose when cellulitis resolved --Will prescribe augmentin to complete total 10 days of abx  Right thigh lump Redness in the right thigh is spreading around a firm painful lump in the anterior thigh. -[place] of thigh personally reviewed. Findings suggesting 3cm abscess noted -Have discussed case with General Surgery, appreciate recs.  -Area of erythema improved with antibiotics. -Per General Surgery, recommendation to continue current abx regimen  Chronic diastolic CHF -Swelling appears improved. -Recently stopped IV Lasix and resumed home oral Lasix -Continuepotassium as tolerated  Venous insufficiency -Compression hose when cellulitis resolved  Diabetes with neuropathy Glucoses well controlled with diet. -A1c noted to be 5.3 -Continue Cymbalta as tolerated  Hypertension -Blood pressure well controlled -Continue metoprolol as tolerated  Sleep apnea -continue with Oxygen at night  Alcohol use Quit completely 9 months ago -Continue thiamine and multivitamin -Continue trazodone at night  Hypothyroidism -Continue levothyroxine as tolerated  Anemia, normocytic Likely from chronic disease  Discharge Instructions   Allergies as of 10/18/2019      Reactions   Claritin [loratadine] Shortness Of Breath, Anxiety      Medication List    STOP taking these medications   aspirin 81 MG chewable tablet   senna 8.6 MG Tabs tablet Commonly known as: SENOKOT     TAKE these medications   amoxicillin-clavulanate 875-125 MG tablet Commonly known as: Augmentin Take 1 tablet by mouth every 12 (twelve) hours for 7 days.  Benadryl Allergy 25  MG tablet Generic drug: diphenhydrAMINE Take 25 mg by mouth at bedtime as needed for allergies.   CANNABIDIOL PO Take 1 capsule by mouth 2 (two) times daily.   docusate sodium 100 MG capsule Commonly known as: COLACE Take 1 capsule (100 mg total) by mouth 2 (two) times daily.   DULoxetine 20 MG capsule Commonly known as: CYMBALTA Take 1 capsule (20 mg total) by mouth daily.   furosemide 40 MG tablet Commonly known as: LASIX TAKE 1 TABLET (40 MG TOTAL) BY MOUTH 2 (TWO) TIMES DAILY. What changed: See the new instructions.   Klor-Con M20 20 MEQ tablet Generic drug: potassium chloride SA TAKE 2 TABLETS (40 MEQ TOTAL) BY MOUTH 3 (THREE) TIMES DAILY. What changed: See the new instructions.   levothyroxine 50 MCG tablet Commonly known as: SYNTHROID Take 1 tablet (50 mcg total) by mouth daily. Need office visit for more refills.   loperamide 2 MG tablet Commonly known as: IMODIUM A-D Take 2 mg by mouth 4 (four) times daily as needed for diarrhea or loose stools.   metoprolol tartrate 50 MG tablet Commonly known as: LOPRESSOR Take 1 tablet (50 mg total) by mouth 2 (two) times daily. What changed:   medication strength  how much to take   multivitamin with minerals Tabs tablet Take 1 tablet by mouth daily.   saccharomyces boulardii 250 MG capsule Commonly known as: FLORASTOR Take 1 capsule (250 mg total) by mouth 2 (two) times daily for 14 days.   thiamine 100 MG tablet TAKE 1 TABLET BY MOUTH EVERY DAY   traZODone 50 MG tablet Commonly known as: DESYREL TAKE 1-2 TABLETS (50-100 MG TOTAL) BY MOUTH AT BEDTIME AS NEEDED FOR SLEEP. What changed:   how much to take  when to take this       Allergies  Allergen Reactions  . Claritin [Loratadine] Shortness Of Breath and Anxiety    Consultations:  General Surgery  Procedures/Studies: MR FOOT LEFT WO CONTRAST  Result Date: 10/16/2019 CLINICAL DATA:  Patient with skin ulcerations on the left foot. Question  osteomyelitis. EXAM: MRI OF THE LEFT FOOT WITHOUT CONTRAST TECHNIQUE: Multiplanar, multisequence MR imaging of the left foot was performed. No intravenous contrast was administered. COMPARISON:  Plain films left foot 10/15/2019. FINDINGS: Bones/Joint/Cartilage A small focus of mild marrow edema is seen in the dorsal margin of the medial cuneiform at its articulation with the navicular. There is low level edema about the second through fifth tarsometatarsal joints. Degenerative disease is present about these joints with joint space narrowing and osteophytosis. The patient has a hallux valgus deformity with joint space narrowing and osteophytosis at the first MTP joint and a small subchondral cyst in the first metatarsal head. Bone marrow signal is otherwise unremarkable. The patient is status post amputation at the level of the distal diaphysis of the third metatarsal. The first and second toes are crossing. Ligaments Intact. Muscles and Tendons No intramuscular fluid collection. There is atrophy of intrinsic musculature the foot. Soft tissues Subcutaneous edema is seen over the dorsum of the foot. Tiny synovial or ganglion cysts are seen over the dorsal margins of the talonavicular joint and articulation of the navicular and middle cuneiform. Soft tissues are otherwise unremarkable. IMPRESSION: Low level marrow edema about the second through fifth TMT joints and dorsal margin of the medial cuneiform at its articulation with the navicular has an appearance most consistent with edema secondary to degenerative disease and stress change rather than osteomyelitis. Hallux valgus and  first MTP osteoarthritis. Subcutaneous edema over the dorsum of the foot could be due to dependent change or cellulitis. No abscess is identified. Electronically Signed   By: Inge Rise M.D.   On: 10/16/2019 10:57   DG Foot 2 Views Left  Result Date: 10/15/2019 CLINICAL DATA:  Bilateral painful ulcers. EXAM: LEFT FOOT - 2 VIEW  COMPARISON:  Mar 14, 2008 FINDINGS: The patient is status post prior amputation through the third metatarsal. There are advanced degenerative changes at the first metatarsophalangeal joint. There is a small erosion at the first tarsometatarsal joint there is slight irregularity at the base of the fifth metatarsal. There is no acute displaced fracture or dislocation. There is a moderate-sized plantar calcaneal spur. There is extensive soft tissue swelling about the forefoot. There is a plantar ulcer overlying the midfoot at the level of the metatarsal bases. IMPRESSION: 1. There is a plantar ulcer overlying the midfoot at the level of the metatarsal bases. 2. There is a small erosion at the first tarsometatarsal joint with slight irregularity at the base of the fifth metatarsal. Osteomyelitis cannot be excluded. 3. Soft tissue swelling of the forefoot. Electronically Signed   By: Constance Holster M.D.   On: 10/15/2019 18:37   DG Foot 2 Views Right  Result Date: 10/15/2019 CLINICAL DATA:  Plantar ulcers EXAM: RIGHT FOOT - 2 VIEW COMPARISON:  Feb 16, 2018 FINDINGS: The patient is status post amputation through the third metatarsal. There are advanced degenerative changes of the first metatarsophalangeal joint as well as the second metatarsophalangeal joint. There is a moderate-sized plantar calcaneal spur. There is mild soft tissue swelling about the forefoot. There is no acute displaced fracture. No dislocation. There is a likely old healed fracture of the proximal phalanx of the second digit. IMPRESSION: 1. No acute osseous abnormality. 2. Chronic findings as above. 3. Mild soft tissue swelling about the forefoot. Electronically Signed   By: Constance Holster M.D.   On: 10/15/2019 18:39   [place] RT LOWER EXTREM LTD SOFT TISSUE NON VASCULAR  Result Date: 10/17/2019 CLINICAL DATA:  Lump of thigh on the right EXAM: ULTRASOUND RIGHT LOWER EXTREMITY LIMITED TECHNIQUE: Ultrasound examination of the lower extremity  soft tissues was performed in the area of clinical concern. COMPARISON:  None. FINDINGS: Imaging performed over the right thigh. Generalized subcutaneous edema with bands of reticulation between the fat lobules. Along the deep subcutaneous and muscular layers is a hypoechoic avascular mildly complex collection which measures up to 3 cm. IMPRESSION: Deep subcutaneous collection measuring up to 3 cm with regional subcutaneous edema. Assuming there is regional cellulitis findings are most suggestive of abscess. Electronically Signed   By: Monte Fantasia M.D.   On: 10/17/2019 08:04     Subjective: Eager to go home  Discharge Exam: Vitals:   10/17/19 2112 10/18/19 0526  BP: 130/77 (!) 158/86  Pulse: 82 72  Resp:    Temp: 98 F (36.7 C) (!) 97.5 F (36.4 C)  SpO2: 93% 94%   Vitals:   10/17/19 0548 10/17/19 1558 10/17/19 2112 10/18/19 0526  BP: 131/71 121/76 130/77 (!) 158/86  Pulse: 79 84 82 72  Resp: 18 20    Temp: 98.2 F (36.8 C) 98.5 F (36.9 C) 98 F (36.7 C) (!) 97.5 F (36.4 C)  TempSrc: Oral Oral Oral Oral  SpO2: 94% 96% 93% 94%  Weight:      Height:        General: Pt is alert, awake, not in acute distress Cardiovascular:  RRR, S1/S2 +, no rubs, no gallops Respiratory: CTA bilaterally, no wheezing, no rhonchi Abdominal: Soft, NT, ND, bowel sounds + Extremities: no edema, no cyanosis   The results of significant diagnostics from this hospitalization (including imaging, microbiology, ancillary and laboratory) are listed below for reference.     Microbiology: Recent Results (from the past 240 hour(s))  Blood culture (routine x 2)     Status: None (Preliminary result)   Collection Time: 10/15/19  3:16 PM   Specimen: BLOOD RIGHT HAND  Result Value Ref Range Status   Specimen Description   Final    BLOOD RIGHT HAND Performed at [facility], [address], [place], [place] [address]    Special Requests   Final    BOTTLES DRAWN AEROBIC AND  ANAEROBIC Blood Culture adequate volume Performed at [facility], [address], [place], [place] [address]    Culture   Final    NO GROWTH 2 DAYS Performed at [facility], [address] [place], [place] [address]    Report Status PENDING  Incomplete  Blood culture (routine x 2)     Status: None (Preliminary result)   Collection Time: 10/15/19  3:16 PM   Specimen: BLOOD LEFT HAND  Result Value Ref Range Status   Specimen Description   Final    BLOOD LEFT HAND Performed at [facility], [address], [place], [place] [address]    Special Requests   Final    BOTTLES DRAWN AEROBIC AND ANAEROBIC Blood Culture adequate volume Performed at [facility], [address], [place], [place] [address]    Culture   Final    NO GROWTH 2 DAYS Performed at [facility], [address], [place], [place] [address]    Report Status PENDING  Incomplete  SARS CORONAVIRUS 2 (TAT 6-24 HRS) Nasopharyngeal Nasopharyngeal Swab     Status: None   Collection Time: 10/15/19  3:16 PM   Specimen: Nasopharyngeal Swab  Result Value Ref Range Status   SARS Coronavirus 2 NEGATIVE NEGATIVE Final    Comment: (NOTE) SARS-CoV-2 target nucleic acids are NOT DETECTED. The SARS-CoV-2 RNA is generally detectable in upper and lower respiratory specimens during the acute phase of infection. Negative results do not preclude SARS-CoV-2 infection, do not rule out co-infections with other pathogens, and should not be used as the sole basis for treatment or other patient management decisions. Negative results must be combined with clinical observations, patient history, and epidemiological information. The expected result is Negative. Fact Sheet for Patients: HairSlick.no Fact Sheet for Healthcare Providers: quierodirigir.com This test is not yet approved or cleared by the [place] FDA and   has been authorized for detection and/or diagnosis of SARS-CoV-2 by FDA under an Emergency Use Authorization (EUA). This EUA will remain  in effect (meaning this test can be used) for the duration of the COVID-19 declaration under Section 56 4(b)(1) of the Act, 21 U.S.C. section 360bbb-3(b)(1), unless the authorization is terminated or revoked sooner. Performed at [facility], [address], [place], [place] [address]   Aerobic Culture (superficial specimen)     Status: None (Preliminary result)   Collection Time: 10/15/19  4:10 PM   Specimen: Wound  Result Value Ref Range Status   Specimen Description WOUND RIGHT FOOT  Final   Special Requests NONE  Final   Gram Stain NO WBC SEEN NO ORGANISMS SEEN   Final   Culture  Final    CULTURE REINCUBATED FOR BETTER GROWTH Performed at [facility], [address] [place], [place] [address]    Report Status PENDING  Incomplete     Labs: BNP (last 3 results) Recent Labs    04/24/19 2009  BNP 165.3*   Basic Metabolic Panel: Recent Labs  Lab 10/15/19 1354 10/16/19 0507 10/17/19 0529  NA 133* 138 137  K 3.5 3.4* 3.9  CL 95* 99 101  CO2 GLUCOSE 123* 92 145*  BUN CREATININE 0.79 0.74 0.76  CALCIUM 8.8* 8.9 9.1   Liver Function Tests: Recent Labs  Lab 10/15/19 1354 10/16/19 0507 10/17/19 0529  AST ALT ALKPHOS 182* 164* 197*  BILITOT 1.4* 1.0 0.7  PROT 8.2* 7.6 7.5  ALBUMIN 2.9* 2.7* 2.7*   No results for input(s): LIPASE, AMYLASE in the last 168 hours. No results for input(s): AMMONIA in the last 168 hours. CBC: Recent Labs  Lab 10/15/19 1354 10/16/19 0507 10/17/19 0529  WBC 12.1* 7.5 7.4  NEUTROABS 9.9*  --   --   HGB 11.9* 10.8* 11.4*  HCT 36.2* 33.7* 36.0*  MCV 92.6 93.9 95.5  PLT 266 240 291   Cardiac Enzymes: No results for input(s): CKTOTAL, CKMB, CKMBINDEX, TROPONINI in the last 168 hours. BNP: Invalid input(s): POCBNP CBG: No results for  input(s): GLUCAP in the last 168 hours. D-Dimer No results for input(s): DDIMER in the last 72 hours. Hgb A1c Recent Labs    10/17/19 0529  HGBA1C 5.3   Lipid Profile No results for input(s): CHOL, HDL, LDLCALC, TRIG, CHOLHDL, LDLDIRECT in the last 72 hours. Thyroid function studies No results for input(s): TSH, T4TOTAL, T3FREE, THYROIDAB in the last 72 hours.  Invalid input(s): FREET3 Anemia work up No results for input(s): VITAMINB12, FOLATE, FERRITIN, TIBC, IRON, RETICCTPCT in the last 72 hours. Urinalysis No results found for: COLORURINE, APPEARANCEUR, LABSPEC, PHURINE, GLUCOSEU, HGBUR, BILIRUBINUR, KETONESUR, PROTEINUR, UROBILINOGEN, NITRITE, LEUKOCYTESUR Sepsis Labs Invalid input(s): PROCALCITONIN,  WBC,  LACTICIDVEN Microbiology Recent Results (from the past 240 hour(s))  Blood culture (routine x 2)     Status: None (Preliminary result)   Collection Time: 10/15/19  3:16 PM   Specimen: BLOOD RIGHT HAND  Result Value Ref Range Status   Specimen Description   Final    BLOOD RIGHT HAND Performed at [facility], [address], [place], [place] [address]    Special Requests   Final    BOTTLES DRAWN AEROBIC AND ANAEROBIC Blood Culture adequate volume Performed at [facility], [address], [place], [place] [address]    Culture   Final    NO GROWTH 2 DAYS Performed at [facility], [address], [place], [place] [address]    Report Status PENDING  Incomplete  Blood culture (routine x 2)     Status: None (Preliminary result)   Collection Time: 10/15/19  3:16 PM   Specimen: BLOOD LEFT HAND  Result Value Ref Range Status   Specimen Description   Final    BLOOD LEFT HAND Performed at [facility], [address], [place], [place] [address]    Special Requests   Final    BOTTLES DRAWN AEROBIC AND ANAEROBIC Blood Culture adequate volume Performed at [facility], [address],  [place], [place] [address]    Culture   Final    NO GROWTH 2 DAYS  Performed at [facility], [address], [place], [place] [address]    Report Status PENDING  Incomplete  SARS CORONAVIRUS 2 (TAT 6-24 HRS) Nasopharyngeal Nasopharyngeal Swab     Status: None   Collection Time: 10/15/19  3:16 PM   Specimen: Nasopharyngeal Swab  Result Value Ref Range Status   SARS Coronavirus 2 NEGATIVE NEGATIVE Final    Comment: (NOTE) SARS-CoV-2 target nucleic acids are NOT DETECTED. The SARS-CoV-2 RNA is generally detectable in upper and lower respiratory specimens during the acute phase of infection. Negative results do not preclude SARS-CoV-2 infection, do not rule out co-infections with other pathogens, and should not be used as the sole basis for treatment or other patient management decisions. Negative results must be combined with clinical observations, patient history, and epidemiological information. The expected result is Negative. Fact Sheet for Patients: HairSlick.no Fact Sheet for Healthcare Providers: quierodirigir.com This test is not yet approved or cleared by the [place] FDA and  has been authorized for detection and/or diagnosis of SARS-CoV-2 by FDA under an Emergency Use Authorization (EUA). This EUA will remain  in effect (meaning this test can be used) for the duration of the COVID-19 declaration under Section 56 4(b)(1) of the Act, 21 U.S.C. section 360bbb-3(b)(1), unless the authorization is terminated or revoked sooner. Performed at [facility], [address] [place], [place] [address]   Aerobic Culture (superficial specimen)     Status: None (Preliminary result)   Collection Time: 10/15/19  4:10 PM   Specimen: Wound  Result Value Ref Range Status   Specimen Description WOUND RIGHT FOOT  Final   Special Requests NONE  Final   Gram Stain NO WBC SEEN NO ORGANISMS SEEN   Final   Culture   Final     CULTURE REINCUBATED FOR BETTER GROWTH Performed at [facility], [address] [place], [place] [address]    Report Status PENDING  Incomplete   Time spent: 30 min  SIGNED:   Rickey Barbara, MD  [facility] 10/18/2019, 10:44 AM  If 7PM-7AM, please contact night-coverage

## 2019-10-20 LAB — AEROBIC CULTURE W GRAM STAIN (SUPERFICIAL SPECIMEN): Gram Stain: NONE SEEN

## 2019-10-20 LAB — CULTURE, BLOOD (ROUTINE X 2)
Culture: NO GROWTH
Culture: NO GROWTH
Special Requests: ADEQUATE
Special Requests: ADEQUATE

## 2019-10-22 ENCOUNTER — Telehealth: Payer: Self-pay | Admitting: *Deleted

## 2019-10-22 NOTE — Telephone Encounter (Signed)
Transition Care Management Follow-up Telephone Call   Date discharged? 10/18/19   How have you been since you were released from the hospital? Pt states he is doing fine   Do you understand why you were in the hospital? YES   Do you understand the discharge instructions? YES   Where were you discharged to? Home   Items Reviewed:  Medications reviewed: YES, HE states he is still taking the antibiotic that was rx'd  Allergies reviewed: YES  Dietary changes reviewed: YES, diabetic diet  Referrals reviewed: YES, he states he have appt set up w/wound clinic 11/08/19   Functional Questionnaire:   Activities of Daily Living (ADLs):   He states he are independent in the following: ambulation, bathing and hygiene, feeding, continence, grooming, toileting and dressing States he doesn't require assistance but sometimes has when he feel unsteady he has a cane   Any transportation issues/concerns?: NO   Any patient concerns? NO   Confirmed importance and date/time of follow-up visits scheduled YES, appt 10/29/19  Provider Appointment booked with Dr. Lawerance Bach  Confirmed with patient if condition begins to worsen call PCP or go to the ER.  Patient was given the office number and encouraged to call back with question or concerns.  : YES

## 2019-10-23 NOTE — Consult Note (Signed)
   [facility] CM Inpatient Consult   10/23/2019  Pape Parson [birth year] [id number]    Follow-Up Note:   Patient's discharge disposition was determined and he discharged home on 10/18/19 prior to speaking to him.   Called patient to follow-up for potential need of [facility], music services under his Medicare/ [employer] plan.  Attempted to call patient on phone number listed in Epic butno response. HIPAA compliant voice message left with request for a return call.     Primary Care Provider isDr. [name] with  at [facility], listed to provide transition of care follow-up (noted TOC follow-up done on 10/21/19). PCP appointment scheduled on 10/29/19.  Patient will benefit from [facility] General callsto follow-up post discharge.  Please place a [facility] Care Management consult as appropriate.   For questions and additional information, please call:   Akemi Overholser A. Shae Augello, BSN, RN-BC [facility] Liaison Cell: [phone]

## 2019-10-26 ENCOUNTER — Other Ambulatory Visit: Payer: Self-pay | Admitting: Internal Medicine

## 2019-10-27 ENCOUNTER — Other Ambulatory Visit: Payer: Self-pay | Admitting: Internal Medicine

## 2019-10-28 DIAGNOSIS — G621 Alcoholic polyneuropathy: Secondary | ICD-10-CM | POA: Insufficient documentation

## 2019-10-28 NOTE — Patient Instructions (Addendum)
   Medications reviewed and updated.  Changes include :   none     Please followup in 4 months   

## 2019-10-28 NOTE — Progress Notes (Signed)
Subjective:    Patient ID: [name], male    DOB: 1961/01/05, 59 y.o.   MRN: [id number]  HPI The patient is here for follow up from the hospital .   Admitted 10/15/19-10/18/19 for worsening foot ulcers.   He has chronic venous insufficiency and has a chronic left foot ulcer.  Over the past several weeks he was on his feet more and had recurrence of foot wounds with purulent drainage.  They continued to get worse - they were more painful and he had extensive redness spreading on the legs.   On admission his HR was 116, WBC 12.  He was started on unasyn.  Chronic foot ulcers secondary to chronic venous insufficiency and alcoholic neuropathy: Left foot imaging: small erosion at first MTJ and slight irregularity at base of fifth metatarsal.  Osteomyelitis can not be ruled out ABI early 2020 normal On Unasyn MRI  - no osteomyelitis WOC consulted Nutrition consulted Restart compression hose when cellulitis resolved augmentin on d/c to complete 10 days, which she has completed  Right thigh lump Redness in right thigh spreading around firm painful lump in R ant thigh [place] of thigh - 3 cm abscess Discussed general surgery Erythema improved with antibiotics   Chronic diastolic CHF Swelling improved IV lasix in hospital given, d/c'd on home oral dose Oral potassium  Venous insufficiency Compression hose when cellulitic resolved  Alcoholic neuropathy - not diabetic, depression cymbalta  Hypertension BP controlled, stable  Sleep apnea On oxygen at night, continue  Alcohol use Quit 9 months ago, did drink in on his birthday, Christmas and new years and then stopped again Continue thiamine, MVI Trazodone at night  Hypothyroidism Levothyroxine  Anemia, normocytic Likely from chronic disease   Chronic foot ulcers secondary to venous insufficiency and alcoholic peripheral neuropathy: He is going to the wound care in two days.  He is changing the bandages daily.  There  is still discharge from them, but he does think they have improved some.  He denies pain secondary to the neuropathy.  He has not had any fevers or chills.  He did complete the antibiotics.  He states he is trying to eat healthy.  Chronic diastolic CHF: He received IV Lasix while in hospital.  He is currently taking his furosemide twice daily as prescribed and his potassium.  He feels his leg swelling is controlled.  On occasion he will take an extra dose, which does help if he has extra swelling.  Alcoholism, alcoholic peripheral neuropathy: He did drink on the special occasions over the past few months, but is not drinking continuously.  He is not currently drinking any alcohol and understands how important it is for him to not drink any alcohol.  He has significant decrease sensation in his lower extremities.  Hypertension,  Hypothyroidism, depression: He is taking his medications daily as prescribed.  Abscess right thigh with cellulitis: He did have an ultrasound while in the hospital that confirmed an abscess.  With completing antibiotics the redness has resolved and the size of the abscess has significantly improved.  He denies any pain.   Medications and allergies reviewed with patient and updated if appropriate.  Patient Active Problem List   Diagnosis Date Noted  . Alcoholic peripheral neuropathy (HCC) 10/28/2019  . Chronic diastolic CHF (congestive heart failure) (HCC) 10/15/2019  . Chronic foot ulcer (HCC) 10/15/2019  . SIRS (systemic inflammatory response syndrome) (HCC) 10/15/2019  . Lymphedema 05/04/2019  . Alcoholic cirrhosis of liver without ascites (HCC)  04/27/2019  . Obesity hypoventilation syndrome (HCC) 04/27/2019  . Depression 04/25/2019  . Severe alcohol use disorder (HCC)   . DOE (dyspnea on exertion) 04/22/2019  . Hypothyroidism 04/22/2019  . Anemia 02/06/2019  . Cold intolerance 02/06/2019  . Acute on chronic diastolic CHF (congestive heart failure) (HCC)  01/30/2019  . Cellulitis 01/24/2019  . Avascular necrosis of hip, left (HCC) 11/02/2018  . Decreased hearing of both ears 10/30/2018  . Avascular necrosis of hip, right (HCC) 08/16/2018  . Bilateral leg edema 02/27/2018  . Marijuana abuse 02/16/2018  . Ulcer of left heel (HCC) 12/21/2016  . Bilateral carpal tunnel syndrome 12/15/2016  . Fatty liver 12/14/2016  . Hyperlipidemia 11/25/2016  . Elevated liver enzymes 11/25/2016  . Prediabetes 11/25/2016  . Hypertension 11/23/2016  . Neuropathy 11/23/2016  . Toe amputation status, right 11/23/2016  . Morbid obesity (HCC) 11/23/2016  . OSA (obstructive sleep apnea) 11/23/2016  . Other hammer toe (acquired) 11/11/2013  . Exstrophy of bladder 11/11/2013    Current Outpatient Medications on File Prior to Visit  Medication Sig Dispense Refill  . CANNABIDIOL PO Take 1 capsule by mouth 2 (two) times daily.    . diphenhydrAMINE (BENADRYL ALLERGY) 25 MG tablet Take 25 mg by mouth at bedtime as needed for allergies.     Marland Kitchen docusate sodium (COLACE) 100 MG capsule Take 1 capsule (100 mg total) by mouth 2 (two) times daily. 60 capsule 1  . KLOR-CON M20 20 MEQ tablet TAKE 2 TABLETS (40 MEQ TOTAL) BY MOUTH 3 (THREE) TIMES DAILY. 180 tablet 1  . levothyroxine (SYNTHROID) 50 MCG tablet TAKE 1 TABLET (50 MCG TOTAL) BY MOUTH DAILY. NEED OFFICE VISIT FOR MORE REFILLS. 30 tablet 0  . loperamide (IMODIUM A-D) 2 MG tablet Take 2 mg by mouth 4 (four) times daily as needed for diarrhea or loose stools.    . metoprolol tartrate (LOPRESSOR) 50 MG tablet Take 1 tablet (50 mg total) by mouth 2 (two) times daily. 60 tablet 0  . Multiple Vitamin (MULTIVITAMIN WITH MINERALS) TABS tablet Take 1 tablet by mouth daily. 30 tablet 0  . saccharomyces boulardii (FLORASTOR) 250 MG capsule Take 1 capsule (250 mg total) by mouth 2 (two) times daily for 14 days. 28 capsule 0  . thiamine 100 MG tablet TAKE 1 TABLET BY MOUTH EVERY DAY 100 tablet 0  . traZODone (DESYREL) 50 MG tablet  TAKE 1-2 TABLETS (50-100 MG TOTAL) BY MOUTH AT BEDTIME AS NEEDED FOR SLEEP. (Patient taking differently: Take 50 mg by mouth at bedtime. ) 180 tablet 1  . TURMERIC PO Take by mouth.     No current facility-administered medications on file prior to visit.    Past Medical History:  Diagnosis Date  . Alcohol dependence (HCC)   . Arthritis    hips, hands  . Bilateral carpal tunnel syndrome   . Bilateral leg edema    Chronic  . Diabetes mellitus without complication (HCC)    type 2  . Diverticulitis    portion of colon removed  . DOE (dyspnea on exertion)    occ  . Elevated liver enzymes   . Fatty liver   . GERD (gastroesophageal reflux disease)    occ  . Hammer toe   . Hip pain   . History of ventral hernia repair 2016   x2  . Hyperlipidemia    pt unsure  . Hypertension   . Marijuana abuse   . Morbid obesity (HCC)   . Neuromuscular disorder (HCC)  peripheral neuropathy feet and few fingers  . OSA (obstructive sleep apnea)    has OSA-not used CPAP 2-3 yrs could not tolerate cpap  . PONV (postoperative nausea and vomiting)   . Toe ulcer (HCC)    left healed    Past Surgical History:  Procedure Laterality Date  . AMPUTATION TOE Left 05/25/2018   Procedure: AMPUTATION TOE left 3rd;  Surgeon: Toni Arthurs, MD;  Location: [facility];  Service: Orthopedics;  Laterality: Left;  , to follow  . AMPUTATION TOE Left 06/28/2018   Procedure: Left foot revision 3rd toe amputation including 3rd metatarsal;  Surgeon: Toni Arthurs, MD;  Location: [facility];  Service: Orthopedics;  Laterality: Left;   . BICEPS TENDON REPAIR Left 2014   Partial  . COLONOSCOPY    . HERNIA REPAIR  2016   ventral  . HIP CLOSED REDUCTION Right 09/01/2018   Procedure: CLOSED REDUCTION HIP;  Surgeon: Yolonda Kida, MD;  Location: [facility];  Service: Orthopedics;  Laterality: Right;  . JOINT REPLACEMENT     b/l knees   . TOE AMPUTATION Right 09/2013  . TOTAL  HIP ARTHROPLASTY Right 08/16/2018   Procedure: TOTAL HIP ARTHROPLASTY ANTERIOR APPROACH;  Surgeon: Samson Frederic, MD;  Location: [facility];  Service: Orthopedics;  Laterality: Right;  . TOTAL HIP ARTHROPLASTY Left 11/02/2018   Procedure: TOTAL HIP ARTHROPLASTY ANTERIOR APPROACH;  Surgeon: Samson Frederic, MD;  Location: [facility];  Service: Orthopedics;  Laterality: Left;  . TOTAL KNEE ARTHROPLASTY     bilat    Social History   Socioeconomic History  . Marital status: Divorced    Spouse name: Not on file  . Number of children: Not on file  . Years of education: Not on file  . Highest education level: Not on file  Occupational History  . Occupation: unemployed, Press photographer for disability  Tobacco Use  . Smoking status: Former Smoker    Packs/day: 0.25    Years: 10.00    Pack years: 2.50    Types: Cigarettes  . Smokeless tobacco: Never Used  . Tobacco comment: quit 2018  Substance and Sexual Activity  . Alcohol use: Yes    Comment: 2-3 drinks 4-5 nights a week, liquor  . Drug use: Yes    Frequency: 1.0 times per week    Types: Marijuana    Comment: "4 times a month, maybe"  . Sexual activity: Not on file  Other Topics Concern  . Not on file  Social History Narrative  . Not on file   Social Determinants of Health   Financial Resource Strain:   . Difficulty of Paying Living Expenses: Not on file  Food Insecurity:   . Worried About Programme researcher, broadcasting/film/video in the Last Year: Not on file  . Ran Out of Food in the Last Year: Not on file  Transportation Needs:   . Lack of Transportation (Medical): Not on file  . Lack of Transportation (Non-Medical): Not on file  Physical Activity:   . Days of Exercise per Week: Not on file  . Minutes of Exercise per Session: Not on file  Stress:   . Feeling of Stress : Not on file  Social Connections:   . Frequency of Communication with Friends and Family: Not on file  . Frequency of Social Gatherings with Friends and Family: Not on file  . Attends  Religious Services: Not on file  . Active Member of Clubs or Organizations: Not on file  . Attends  Club or Organization Meetings: Not on file  . Marital Status: Not on file    Family History  Adopted: Yes  Family history unknown: Yes    Review of Systems  Constitutional: Negative for chills and fever.  Respiratory: Negative for cough, shortness of breath and wheezing.   Cardiovascular: Positive for leg swelling. Negative for chest pain and palpitations.  Musculoskeletal: Positive for arthralgias.  Skin: Positive for wound.  Neurological: Negative for light-headedness and headaches.       Objective:   Vitals:   10/29/19 1538  BP: 134/70  Pulse: 80  Resp: 18  Temp: 98.7 F (37.1 C)  SpO2: 98%   BP Readings from Last 3 Encounters:  10/29/19 134/70  10/18/19 131/76  05/04/19 122/80   Wt Readings from Last 3 Encounters:  10/29/19 264 lb (119.7 kg)  10/15/19 250 lb (113.4 kg)  05/04/19 (!) 338 lb 4 oz (153.4 kg)   Body mass index is 34.83 kg/m.   Physical Exam    Constitutional: Appears well-developed and well-nourished. No distress.  HENT:  Head: Normocephalic and atraumatic.  Neck: Neck supple. No tracheal deviation present. No thyromegaly present.  No cervical lymphadenopathy Cardiovascular: Normal rate, regular rhythm and normal heart sounds.  No murmur heard. No carotid bruit .  Mild bilateral lower extremity edema Pulmonary/Chest: Effort normal and breath sounds normal. No respiratory distress. No has no wheezes. No rales. Abdomen: Obese, soft, nontender Skin: Skin is warm and dry. Not diaphoretic.  Deferred looking ulcers-he just change the bandages and will see wound care in 2 days.  Mild generalized erythema bilateral lower extremities that appears to be chronic in nature tangerine sized mass right anterior mid thigh that is nontender, no fluctuance without overlying erythema Psychiatric: Normal mood and affect. Behavior is normal.    Lab Results    Component Value Date   WBC 7.4 10/17/2019   HGB 11.4 (L) 10/17/2019   HCT 36.0 (L) 10/17/2019   PLT 291 10/17/2019   GLUCOSE 145 (H) 10/17/2019   CHOL 197 02/01/2018   TRIG 135.0 02/01/2018   HDL 36.90 (L) 02/01/2018   LDLCALC 133 (H) 02/01/2018   ALT 19 10/17/2019   AST 21 10/17/2019   NA 137 10/17/2019   K 3.9 10/17/2019   CL 101 10/17/2019   CREATININE 0.76 10/17/2019   BUN 18 10/17/2019   CO2 26 10/17/2019   TSH 6.49 (H) 04/23/2019   PSA 0.21 02/01/2018   HGBA1C 5.3 10/17/2019    [place] RT LOWER EXTREM LTD SOFT TISSUE NON VASCULAR CLINICAL DATA:  Lump of thigh on the right  EXAM: ULTRASOUND RIGHT LOWER EXTREMITY LIMITED  TECHNIQUE: Ultrasound examination of the lower extremity soft tissues was performed in the area of clinical concern.  COMPARISON:  None.  FINDINGS: Imaging performed over the right thigh. Generalized subcutaneous edema with bands of reticulation between the fat lobules. Along the deep subcutaneous and muscular layers is a hypoechoic avascular mildly complex collection which measures up to 3 cm.  IMPRESSION: Deep subcutaneous collection measuring up to 3 cm with regional subcutaneous edema. Assuming there is regional cellulitis findings are most suggestive of abscess.  Electronically Signed   By: Monte Fantasia M.D.   On: 10/17/2019 08:04   Assessment & Plan:    See Problem List for Assessment and Plan of chronic medical problems.    This visit occurred during the SARS-CoV-2 public health emergency.  Safety protocols were in place, including screening questions prior to the visit, additional usage of staff  PPE, and extensive cleaning of exam room while observing appropriate contact time as indicated for disinfecting solutions.  '

## 2019-10-29 ENCOUNTER — Encounter: Payer: Self-pay | Admitting: Internal Medicine

## 2019-10-29 ENCOUNTER — Ambulatory Visit (INDEPENDENT_AMBULATORY_CARE_PROVIDER_SITE_OTHER): Payer: Medicare (Managed Care) | Admitting: Internal Medicine

## 2019-10-29 ENCOUNTER — Other Ambulatory Visit: Payer: Self-pay

## 2019-10-29 VITALS — BP 134/70 | HR 80 | Temp 98.7°F | Resp 18 | Ht 73.0 in | Wt 264.0 lb

## 2019-10-29 DIAGNOSIS — F3289 Other specified depressive episodes: Secondary | ICD-10-CM

## 2019-10-29 DIAGNOSIS — E039 Hypothyroidism, unspecified: Secondary | ICD-10-CM | POA: Diagnosis not present

## 2019-10-29 DIAGNOSIS — I10 Essential (primary) hypertension: Secondary | ICD-10-CM

## 2019-10-29 DIAGNOSIS — R7303 Prediabetes: Secondary | ICD-10-CM

## 2019-10-29 DIAGNOSIS — G621 Alcoholic polyneuropathy: Secondary | ICD-10-CM

## 2019-10-29 DIAGNOSIS — L97502 Non-pressure chronic ulcer of other part of unspecified foot with fat layer exposed: Secondary | ICD-10-CM

## 2019-10-29 DIAGNOSIS — G629 Polyneuropathy, unspecified: Secondary | ICD-10-CM

## 2019-10-29 DIAGNOSIS — I5033 Acute on chronic diastolic (congestive) heart failure: Secondary | ICD-10-CM

## 2019-10-29 DIAGNOSIS — L03119 Cellulitis of unspecified part of limb: Secondary | ICD-10-CM

## 2019-10-29 MED ORDER — FUROSEMIDE 40 MG PO TABS: ORAL_TABLET | ORAL | 1 refills | Status: DC

## 2019-10-29 MED ORDER — DULOXETINE HCL 20 MG PO CPEP: 20.0000 mg | ORAL_CAPSULE | Freq: Every day | ORAL | 1 refills | Status: DC

## 2019-10-29 NOTE — Assessment & Plan Note (Signed)
Received IV fluids in the hospital and now on oral furosemide twice daily with potassium Appears to be well compensated today Continue furosemide at current dose.  Okay to take an extra furosemide if needed for extra swelling

## 2019-10-29 NOTE — Assessment & Plan Note (Signed)
Chronic, stable. Continue levothyroxine at current dose. 

## 2019-10-29 NOTE — Assessment & Plan Note (Signed)
Sugars have remained in the prediabetic range-he has never been in the diabetic range Stable We will continue to monitor every 6 months

## 2019-10-29 NOTE — Assessment & Plan Note (Signed)
Chronic, stable Continue Cymbalta 20 mg daily

## 2019-10-29 NOTE — Assessment & Plan Note (Signed)
Had cellulitis and abscess right anterior thigh Much improved with antibiotics, but still has palpable mass in right anterior thigh-nontender Surgery was consulted while in the hospital He will monitor closely and if this becomes red, starts to get larger or any tenderness he will need to start antibiotics and possibly see surgery

## 2019-10-29 NOTE — Assessment & Plan Note (Signed)
Bilateral chronic foot ulcers that worsened over the past month or so, which led him to go to the hospital Was following with vascular, but improved and then worsened Will be establishing with the wound care center in 2 days He will continue to change the bandages once a day until he sees them and then they will manage

## 2019-10-29 NOTE — Assessment & Plan Note (Signed)
Chronic Well-controlled today Continue current medications at current doses

## 2019-10-29 NOTE — Assessment & Plan Note (Signed)
Chronic peripheral neuropathy, likely secondary to history of alcohol abuse In the past few months he has drink on occasion, but is not drinking consistently.  He is not currently drinking and stressed abstinence Continue thiamine, multivitamin

## 2019-10-31 ENCOUNTER — Other Ambulatory Visit: Payer: Self-pay

## 2019-10-31 ENCOUNTER — Encounter (HOSPITAL_BASED_OUTPATIENT_CLINIC_OR_DEPARTMENT_OTHER): Payer: Medicare (Managed Care) | Attending: Physician Assistant | Admitting: Physician Assistant

## 2019-10-31 DIAGNOSIS — L97512 Non-pressure chronic ulcer of other part of right foot with fat layer exposed: Secondary | ICD-10-CM | POA: Insufficient documentation

## 2019-10-31 DIAGNOSIS — Z888 Allergy status to other drugs, medicaments and biological substances status: Secondary | ICD-10-CM | POA: Insufficient documentation

## 2019-10-31 DIAGNOSIS — L97522 Non-pressure chronic ulcer of other part of left foot with fat layer exposed: Secondary | ICD-10-CM | POA: Diagnosis present

## 2019-10-31 DIAGNOSIS — E11621 Type 2 diabetes mellitus with foot ulcer: Secondary | ICD-10-CM | POA: Diagnosis not present

## 2019-10-31 DIAGNOSIS — M1990 Unspecified osteoarthritis, unspecified site: Secondary | ICD-10-CM | POA: Diagnosis not present

## 2019-10-31 DIAGNOSIS — L84 Corns and callosities: Secondary | ICD-10-CM | POA: Insufficient documentation

## 2019-10-31 DIAGNOSIS — E1140 Type 2 diabetes mellitus with diabetic neuropathy, unspecified: Secondary | ICD-10-CM | POA: Diagnosis not present

## 2019-10-31 DIAGNOSIS — I872 Venous insufficiency (chronic) (peripheral): Secondary | ICD-10-CM | POA: Insufficient documentation

## 2019-10-31 DIAGNOSIS — Z87891 Personal history of nicotine dependence: Secondary | ICD-10-CM | POA: Insufficient documentation

## 2019-10-31 DIAGNOSIS — K7460 Unspecified cirrhosis of liver: Secondary | ICD-10-CM | POA: Insufficient documentation

## 2019-10-31 DIAGNOSIS — E11622 Type 2 diabetes mellitus with other skin ulcer: Secondary | ICD-10-CM | POA: Insufficient documentation

## 2019-10-31 DIAGNOSIS — I5042 Chronic combined systolic (congestive) and diastolic (congestive) heart failure: Secondary | ICD-10-CM | POA: Diagnosis not present

## 2019-10-31 DIAGNOSIS — G4730 Sleep apnea, unspecified: Secondary | ICD-10-CM | POA: Diagnosis not present

## 2019-10-31 DIAGNOSIS — I110 Hypertensive heart disease with heart failure: Secondary | ICD-10-CM | POA: Insufficient documentation

## 2019-10-31 DIAGNOSIS — E039 Hypothyroidism, unspecified: Secondary | ICD-10-CM | POA: Insufficient documentation

## 2019-11-01 NOTE — Progress Notes (Signed)
ZYGMUND, PASSERO ([id number]) Visit Report for 10/31/2019 Chief Complaint Document Details Patient Name: Date of Service: [name], [name] 10/31/2019 2:45 PM Medical Record [id number] Patient Account Number: [id number] Date of Birth/Sex: Treating RN: [birth year] (59 y.o. [name] Primary Care Provider: [name] Other Clinician: Referring Provider: Treating Provider/Extender:Stone III, Dayton Martes, Stacy Weeks in Treatment: 0 Information Obtained from: Patient Chief Complaint Bilateral foot diabetic ulcers Electronic Signature(s) Signed: 10/31/2019 4:29:14 PM By: Lenda Kelp PA-C Previous Signature: 10/31/2019 4:26:39 PM Version By: Lenda Kelp PA-C Entered By: Lenda Kelp on 10/31/2019 16:29:14 -------------------------------------------------------------------------------- Debridement Details Patient Name: Date of Service: [name], [name] 10/31/2019 2:45 PM Medical Record [id number] Patient Account Number: [id number] Date of Birth/Sex: Treating RN: 26-Jul-1961 (59 y.o. [name] Primary Care Provider: [name] Other Clinician: Referring Provider: Treating Provider/Extender:Stone III, Dayton Martes, Stacy Weeks in Treatment: 0 Debridement Performed for Wound #2 Right Metatarsal head second Assessment: Performed By: Physician Lenda Kelp, PA Debridement Type: Debridement Severity of Tissue Pre Fat layer exposed Debridement: Level of Consciousness (Pre- Awake and Alert procedure): Pre-procedure Verification/Time Out Taken: Yes - 16:34 Start Time: 16:35 Pain Control: Lidocaine 4% Topical Solution Total Area Debrided (L x W): 3.5 (cm) x 3 (cm) = 10.5 (cm) Tissue and other material Viable, Non-Viable, Callus, Slough, Subcutaneous, Skin: Epidermis, Slough debrided: Level: Skin/Subcutaneous Tissue Debridement Description: Excisional Instrument: Curette Bleeding: Minimum Hemostasis Achieved: Pressure End Time: 16:42 Procedural Pain:  0 Post Procedural Pain: 0 Response to Treatment: Procedure was tolerated well Level of Consciousness Awake and Alert (Post-procedure): Post Debridement Measurements of Total Wound Length: (cm) 2 Width: (cm) 1.4 Depth: (cm) 1.2 Volume: (cm) 2.639 Character of Wound/Ulcer Post Improved Debridement: Severity of Tissue Post Debridement: Fat layer exposed Post Procedure Diagnosis Same as Pre-procedure Electronic Signature(s) Signed: 10/31/2019 5:55:19 PM By: Lenda Kelp PA-C Signed: 10/31/2019 6:34:17 PM By: Zenaida Deed RN, BSN Entered By: Zenaida Deed on 10/31/2019 16:40:41 -------------------------------------------------------------------------------- Debridement Details Patient Name: Date of Service: [name], [name] 10/31/2019 2:45 PM Medical Record [id number] Patient Account Number: [id number] Date of Birth/Sex: Treating RN: 03/08/1961 (59 y.o. [name] Primary Care Provider: [name] Other Clinician: Referring Provider: Treating Provider/Extender:Stone III, Dayton Martes, Stacy Weeks in Treatment: 0 Debridement Performed for Wound #1 Left,Plantar Foot Assessment: Performed By: Physician Lenda Kelp, PA Debridement Type: Debridement Severity of Tissue Pre Fat layer exposed Debridement: Level of Consciousness (Pre- Awake and Alert procedure): Pre-procedure Verification/Time Out Taken: Yes - 16:34 Start Time: 16:35 Pain Control: Lidocaine 4% Topical Solution Total Area Debrided (L x W): 3.8 (cm) x 4 (cm) = 15.2 (cm) Tissue and other material Viable, Non-Viable, Callus, Slough, Subcutaneous, Skin: Epidermis, Slough Viable, Non-Viable, Callus, Slough, Subcutaneous, Skin: Epidermis, Slough debrided: Level: Skin/Subcutaneous Tissue Debridement Description: Excisional Instrument: Curette Bleeding: Minimum Hemostasis Achieved: Pressure End Time: 16:42 Procedural Pain: 0 Post Procedural Pain: 0 Response to Treatment: Procedure was  tolerated well Level of Consciousness Awake and Alert (Post-procedure): Post Debridement Measurements of Total Wound Length: (cm) 3 Width: (cm) 3.8 Depth: (cm) 0.7 Volume: (cm) 6.267 Character of Wound/Ulcer Post Improved Debridement: Severity of Tissue Post Debridement: Fat layer exposed Post Procedure Diagnosis Same as Pre-procedure Electronic Signature(s) Signed: 10/31/2019 5:55:19 PM By: Lenda Kelp PA-C Signed: 10/31/2019 6:34:17 PM By: Zenaida Deed RN, BSN Entered By: Zenaida Deed on 10/31/2019 16:49:45 -------------------------------------------------------------------------------- HPI Details Patient Name: Date of Service: [name], [name] 10/31/2019 2:45 PM Medical Record [id number] Patient Account Number: [id number] Date of Birth/Sex: Treating RN: 1960-11-10 (59 y.o. [name] Primary  Care Provider: Billey Gosling Other Clinician: Referring Provider: Treating Provider/Extender:Stone III, Elby Showers, Stacy Weeks in Treatment: 0 History of Present Illness HPI Description: 10/31/2019 upon evaluation today patient appears to be doing somewhat poorly in regard to his bilateral plantar feet. He has wounds that he tells me have been present since 2012 intermittently off and on. Most recently this has been open for at least the past 6 months to a year. He has been trying to treat this in different ways using Santyl along with various other dressings including Medihoney and even at one point Xeroform. Nothing really has seem to get this completely closed. He was recently in the hospital for cellulitis of his leg subsequently he did have x-rays as well as MRIs that showed negative for any signs of osteomyelitis in regard to the wounds on his feet. Fortunately there is no signs of systemic infection at this time. No fevers, chills, nausea, vomiting, or diarrhea. Patient has previously used Darco offloading shoes as far as frontal floaters as well as postop  surgical shoes. He has never been in a total contact cast that may be something we need to strongly consider here. Patient's most recent hemoglobin A1c 1 month ago was 5.3 seems to be very well controlled which is great. Subsequently he has seen vascular as well as podiatry. His ABIs are 1.07 on the left and 1.14 on the right he seems to be doing well he does have chronic venous stasis. Electronic Signature(s) Signed: 10/31/2019 4:54:15 PM By: Worthy Keeler PA-C Previous Signature: 10/31/2019 4:53:32 PM Version By: Worthy Keeler PA-C Entered By: Worthy Keeler on 10/31/2019 16:54:15 -------------------------------------------------------------------------------- Physical Exam Details Patient Name: Date of Service: [name], [name] 10/31/2019 2:45 PM Medical Record [id number] Patient Account Number: [id number] Date of Birth/Sex: Treating RN: March 23, 1961 (59 y.o. [name] Primary Care Provider: Billey Gosling Other Clinician: Referring Provider: Treating Provider/Extender:Stone III, Elby Showers, Stacy Weeks in Treatment: 0 Constitutional sitting or standing blood pressure is within target range for patient.. pulse regular and within target range for patient.Marland Kitchen respirations regular, non-labored and within target range for patient.Marland Kitchen temperature within target range for patient.. Well-nourished and well-hydrated in no acute distress. Eyes conjunctiva clear no eyelid edema noted. pupils equal round and reactive to light and accommodation. Ears, Nose, Mouth, and Throat no gross abnormality of ear auricles or external auditory canals. normal hearing noted during conversation. mucus membranes moist. Respiratory normal breathing without difficulty. Cardiovascular 2+ dorsalis pedis/posterior tibialis pulses. no clubbing, cyanosis, significant edema, <3 sec cap refill. Gastrointestinal (GI) soft, non-tender, non-distended, +BS. no ventral hernia noted. Musculoskeletal normal gait  and posture. no significant deformity or arthritic changes, no loss or range of motion, no clubbing. Psychiatric this patient is able to make decisions and demonstrates good insight into disease process. Alert and Oriented x 3. pleasant and cooperative. Notes Upon inspection patient's wounds today did require sharp debridement to clear away slough and necrotic debris from the surface of the wound as well as significant callus surrounding. I did undertake extensive debridements on both feet post debridement they did appear to be doing much better. Fortunately he seems to have excellent blood flow in fact he actually bled more than I expected during the course of the debridement and following. We were able to achieve hemostasis however with pressure and Surgicel. Electronic Signature(s) Signed: 10/31/2019 5:41:39 PM By: Worthy Keeler PA-C Entered By: Worthy Keeler on 10/31/2019 17:41:39 -------------------------------------------------------------------------------- Physician Orders Details Patient Name: Date of Service: [name]  10/31/2019 2:45 PM Medical Record [id number] Patient Account Number: [id number] Date of Birth/Sex: Treating RN: Oct 28, 1960 (59 y.o. [name] Primary Care Provider: [name] Other Clinician: Referring Provider: Treating Provider/Extender:Stone III, Dayton Martes, Stacy Weeks in Treatment: 0 Verbal / Phone Orders: No Diagnosis Coding ICD-10 Coding Code Description E11.622 Type 2 diabetes mellitus with other skin ulcer L97.522 Non-pressure chronic ulcer of other part of left foot with fat layer exposed L97.512 Non-pressure chronic ulcer of other part of right foot with fat layer exposed I10 Essential (primary) hypertension I50.42 Chronic combined systolic (congestive) and diastolic (congestive) heart failure I87.2 Venous insufficiency (chronic) (peripheral) Follow-up Appointments Return Appointment in 1 week. - plan for cast next  week Dressing Change Frequency Wound #1 Left,Plantar Foot Change Dressing every other day. Wound #2 Right Metatarsal head second Change Dressing every other day. Wound Cleansing Wound #1 Left,Plantar Foot May shower and wash wound with soap and water. Wound #2 Right Metatarsal head second May shower and wash wound with soap and water. Primary Wound Dressing Wound #1 Left,Plantar Foot Calcium Alginate with Silver Wound #2 Right Metatarsal head second Calcium Alginate with Silver Secondary Dressing Wound #1 Left,Plantar Foot Kerlix/Rolled Gauze Dry Gauze Drawtex - cut to fit inside wound margins Wound #2 Right Metatarsal head second Kerlix/Rolled Gauze Dry Gauze Drawtex - cut to fit inside wound margins Edema Control Patient to wear own compression stockings - daily Avoid standing for long periods of time Elevate legs to the level of the heart or above for 30 minutes daily and/or when sitting, a frequency of: Off-Loading Open toe surgical shoe to: - left foot Wedge shoe to: - front offloading shoe to right foot Electronic Signature(s) Signed: 10/31/2019 5:55:19 PM By: Lenda Kelp PA-C Signed: 10/31/2019 6:34:17 PM By: Zenaida Deed RN, BSN Entered By: Zenaida Deed on 10/31/2019 16:48:02 -------------------------------------------------------------------------------- Problem List Details Patient Name: Date of Service: [name], [name] 10/31/2019 2:45 PM Medical Record [id number] Patient Account Number: [id number] Date of Birth/Sex: Treating RN: [birth year] (59 y.o. [name] Primary Care Provider: [name] Other Clinician: Referring Provider: Treating Provider/Extender:Stone III, Dayton Martes, Stacy Weeks in Treatment: 0 Active Problems ICD-10 Evaluated Encounter Code Description Active Date Today Diagnosis E11.622 Type 2 diabetes mellitus with other skin ulcer 10/31/2019 No Yes L97.522 Non-pressure chronic ulcer of other part of left foot  10/31/2019 No Yes with fat layer exposed L97.512 Non-pressure chronic ulcer of other part of right foot 10/31/2019 No Yes with fat layer exposed I10 Essential (primary) hypertension 10/31/2019 No Yes I50.42 Chronic combined systolic (congestive) and diastolic 10/31/2019 No Yes (congestive) heart failure I87.2 Venous insufficiency (chronic) (peripheral) 10/31/2019 No Yes Inactive Problems Resolved Problems Electronic Signature(s) Signed: 10/31/2019 4:31:46 PM By: Lenda Kelp PA-C Entered By: Lenda Kelp on 10/31/2019 16:31:46 -------------------------------------------------------------------------------- Progress Note Details Patient Name: Date of Service: [name], [name] 10/31/2019 2:45 PM Medical Record [id number] Patient Account Number: [id number] Date of Birth/Sex: Treating RN: 09/07/1961 (59 y.o. [name] Primary Care Provider: [name] Other Clinician: Referring Provider: Treating Provider/Extender:Stone III, Dayton Martes, Stacy Weeks in Treatment: 0 Subjective Chief Complaint Information obtained from Patient Bilateral foot diabetic ulcers History of Present Illness (HPI) 10/31/2019 upon evaluation today patient appears to be doing somewhat poorly in regard to his bilateral plantar feet. He has wounds that he tells me have been present since 2012 intermittently off and on. Most recently this has been open for at least the past 6 months to a year. He has been trying to treat this in different  ways using Santyl along with various other dressings including Medihoney and even at one point Xeroform. Nothing really has seem to get this completely closed. He was recently in the hospital for cellulitis of his leg subsequently he did have x-rays as well as MRIs that showed negative for any signs of osteomyelitis in regard to the wounds on his feet. Fortunately there is no signs of systemic infection at this time. No fevers, chills, nausea, vomiting, or diarrhea.  Patient has previously used Darco offloading shoes as far as frontal floaters as well as postop surgical shoes. He has never been in a total contact cast that may be something we need to strongly consider here. Patient's most recent hemoglobin A1c 1 month ago was 5.3 seems to be very well controlled which is great. Subsequently he has seen vascular as well as podiatry. His ABIs are 1.07 on the left and 1.14 on the right he seems to be doing well he does have chronic venous stasis. Patient History Information obtained from Patient. Allergies Claritin (Severity: Moderate, Reaction: anxiety) Family History Unknown History. Social History Former smoker, Marital Status - Divorced, Alcohol Use - Never, Drug Use - Current History - Marijuana, Caffeine Use - Moderate. Medical History Hematologic/Lymphatic Patient has history of Anemia Respiratory Patient has history of Sleep Apnea Denies history of Asthma, Chronic Obstructive Pulmonary Disease (COPD) Cardiovascular Patient has history of Congestive Heart Failure, Hypertension, Peripheral Venous Disease Gastrointestinal Patient has history of Cirrhosis Denies history of Crohnoos Endocrine Patient has history of Type II Diabetes Musculoskeletal Patient has history of Osteoarthritis Neurologic Patient has history of Neuropathy Patient is treated with Controlled Diet. Blood sugar is not tested. Medical And Surgical History Notes Gastrointestinal Hx ETOH abuse Endocrine Hypothyroidism Review of Systems (ROS) Constitutional Symptoms (General Health) Denies complaints or symptoms of Fatigue, Fever, Chills, Marked Weight Change. Eyes Complains or has symptoms of Glasses / Contacts - glasses. Denies complaints or symptoms of Dry Eyes, Vision Changes. Ear/Nose/Mouth/Throat Denies complaints or symptoms of Chronic sinus problems or rhinitis. Gastrointestinal Denies complaints or symptoms of Frequent diarrhea, Nausea,  Vomiting. Genitourinary Denies complaints or symptoms of Frequent urination. Integumentary (Skin) Complains or has symptoms of Wounds - wounds on both feet. Psychiatric Denies complaints or symptoms of Claustrophobia, Suicidal. Objective Constitutional sitting or standing blood pressure is within target range for patient.. pulse regular and within target range for patient.Marland Kitchen respirations regular, non-labored and within target range for patient.Marland Kitchen temperature within target range for patient.. Well-nourished and well-hydrated in no acute distress. Vitals Time Taken: 3:40 PM, Height: 73 in, Source: Stated, Weight: 260 lbs, Source: Stated, BMI: 34.3, Temperature: 98.4 F, Pulse: 96 bpm, Respiratory Rate: 18 breaths/min, Blood Pressure: 133/64 mmHg. Eyes conjunctiva clear no eyelid edema noted. pupils equal round and reactive to light and accommodation. Ears, Nose, Mouth, and Throat no gross abnormality of ear auricles or external auditory canals. normal hearing noted during conversation. mucus membranes moist. Respiratory normal breathing without difficulty. Cardiovascular 2+ dorsalis pedis/posterior tibialis pulses. no clubbing, cyanosis, significant edema, Gastrointestinal (GI) soft, non-tender, non-distended, +BS. no ventral hernia noted. Musculoskeletal normal gait and posture. no significant deformity or arthritic changes, no loss or range of motion, no clubbing. Psychiatric this patient is able to make decisions and demonstrates good insight into disease process. Alert and Oriented x 3. pleasant and cooperative. General Notes: Upon inspection patient's wounds today did require sharp debridement to clear away slough and necrotic debris from the surface of the wound as well as significant callus surrounding. I did undertake  extensive debridements on both feet post debridement they did appear to be doing much better. Fortunately he seems to have excellent blood flow in fact he actually  bled more than I expected during the course of the debridement and following. We were able to achieve hemostasis however with pressure and Surgicel. Integumentary (Hair, Skin) Wound #1 status is Open. Original cause of wound was Gradually Appeared. The wound is located on the Left,Plantar Foot. The wound measures 3cm length x 3.8cm width x 0.7cm depth; 8.954cm^2 area and 6.267cm^3 volume. There is Fat Layer (Subcutaneous Tissue) Exposed exposed. There is no tunneling or undermining noted. There is a medium amount of serosanguineous drainage noted. The wound margin is flat and intact. There is medium (34-66%) pink granulation within the wound bed. There is a medium (34-66%) amount of necrotic tissue within the wound bed including Adherent Slough. Wound #2 status is Open. Original cause of wound was Gradually Appeared. The wound is located on the Right Metatarsal head second. The wound measures 2cm length x 1.4cm width x 1.2cm depth; 2.199cm^2 area and 2.639cm^3 volume. There is Fat Layer (Subcutaneous Tissue) Exposed exposed. There is no tunneling or undermining noted. There is a medium amount of serosanguineous drainage noted. The wound margin is flat and intact. There is medium (34-66%) granulation within the wound bed. There is a medium (34-66%) amount of necrotic tissue within the wound bed including Adherent Slough. Assessment Active Problems ICD-10 Type 2 diabetes mellitus with other skin ulcer Non-pressure chronic ulcer of other part of left foot with fat layer exposed Non-pressure chronic ulcer of other part of right foot with fat layer exposed Essential (primary) hypertension Chronic combined systolic (congestive) and diastolic (congestive) heart failure Venous insufficiency (chronic) (peripheral) Procedures Wound #1 Pre-procedure diagnosis of Wound #1 is a Diabetic Wound/Ulcer of the Lower Extremity located on the Left,Plantar Foot .Severity of Tissue Pre Debridement is: Fat layer  exposed. There was a Excisional Skin/Subcutaneous Tissue Debridement with a total area of 15.2 sq cm performed by Lenda KelpStone III, Kenlee Maler, PA. With the following instrument(s): Curette to remove Viable and Non-Viable tissue/material. Material removed includes Callus, Subcutaneous Tissue, Slough, and Skin: Epidermis after achieving pain control using Lidocaine 4% Topical Solution. No specimens were taken. A time out was conducted at 16:34, prior to the start of the procedure. A Minimum amount of bleeding was controlled with Pressure. The procedure was tolerated well with a pain level of 0 throughout and a pain level of 0 following the procedure. Post Debridement Measurements: 3cm length x 3.8cm width x 0.7cm depth; 6.267cm^3 volume. Character of Wound/Ulcer Post Debridement is improved. Severity of Tissue Post Debridement is: Fat layer exposed. Post procedure Diagnosis Wound #1: Same as Pre-Procedure Wound #2 Pre-procedure diagnosis of Wound #2 is a Diabetic Wound/Ulcer of the Lower Extremity located on the Right Metatarsal head second .Severity of Tissue Pre Debridement is: Fat layer exposed. There was a Excisional Skin/Subcutaneous Tissue Debridement with a total area of 10.5 sq cm performed by Lenda KelpStone III, Ivyanna Sibert, PA. With the following instrument(s): Curette to remove Viable and Non-Viable tissue/material. Material removed includes Callus, Subcutaneous Tissue, Slough, and Skin: Epidermis after achieving pain control using Lidocaine 4% Topical Solution. No specimens were taken. A time out was conducted at 16:34, prior to the start of the procedure. A Minimum amount of bleeding was controlled with Pressure. The procedure was tolerated well with a pain level of 0 throughout and a pain level of 0 following the procedure. Post Debridement Measurements: 2cm length x 1.4cm  width x 1.2cm depth; 2.639cm^3 volume. Character of Wound/Ulcer Post Debridement is improved. Severity of Tissue Post Debridement is: Fat  layer exposed. Post procedure Diagnosis Wound #2: Same as Pre-Procedure Plan Follow-up Appointments: Return Appointment in 1 week. - plan for cast next week Dressing Change Frequency: Wound #1 Left,Plantar Foot: Change Dressing every other day. Wound #2 Right Metatarsal head second: Change Dressing every other day. Wound Cleansing: Wound #1 Left,Plantar Foot: May shower and wash wound with soap and water. Wound #2 Right Metatarsal head second: May shower and wash wound with soap and water. Primary Wound Dressing: Wound #1 Left,Plantar Foot: Calcium Alginate with Silver Wound #2 Right Metatarsal head second: Calcium Alginate with Silver Secondary Dressing: Wound #1 Left,Plantar Foot: Kerlix/Rolled Gauze Dry Gauze Drawtex - cut to fit inside wound margins Wound #2 Right Metatarsal head second: Kerlix/Rolled Gauze Dry Gauze Drawtex - cut to fit inside wound margins Edema Control: Patient to wear own compression stockings - daily Avoid standing for long periods of time Elevate legs to the level of the heart or above for 30 minutes daily and/or when sitting, a frequency of: Off-Loading: Open toe surgical shoe to: - left foot Wedge shoe to: - front offloading shoe to right foot 1. My suggestion at this time is good to be that we go ahead and initiate treatment with a silver alginate dressing I think this will be appropriate. 2. I am also going to recommend that we go ahead and have the patient change this dressing every other day we will use drawtex to fit in the wound margins followed by con form and ABD pads in order to pad the area. I do think postop surgical shoes would probably be ideal for the patient really what he needs is a total contact cast although he wants to wait till next week may be start this on the left foot. 3. I am going to suggest at this point the patient attempt to avoid walking unless he absolutely has to I think less pressure he gets to the area better  off in general he will do. He is in agreement with attempting this. We will see patient back for reevaluation in 1 week here in the clinic. If anything worsens or changes patient will contact our office for additional recommendations. Electronic Signature(s) Signed: 10/31/2019 5:42:43 PM By: Lenda Kelp PA-C Entered By: Lenda Kelp on 10/31/2019 17:42:42 -------------------------------------------------------------------------------- HxROS Details Patient Name: Date of Service: [name], [name] 10/31/2019 2:45 PM Medical Record [id number] Patient Account Number: [id number] Date of Birth/Sex: Treating RN: Apr 10, 1961 (59 y.o. [name] Primary Care Provider: [name] Other Clinician: Referring Provider: Treating Provider/Extender:Stone III, Dayton Martes, Stacy Weeks in Treatment: 0 Information Obtained From Patient Constitutional Symptoms (General Health) Complaints and Symptoms: Negative for: Fatigue; Fever; Chills; Marked Weight Change Eyes Complaints and Symptoms: Positive for: Glasses / Contacts - glasses Negative for: Dry Eyes; Vision Changes Ear/Nose/Mouth/Throat Complaints and Symptoms: Negative for: Chronic sinus problems or rhinitis Gastrointestinal Complaints and Symptoms: Negative for: Frequent diarrhea; Nausea; Vomiting Medical History: Positive for: Cirrhosis Negative for: Crohns Past Medical History Notes: Hx ETOH abuse Genitourinary Complaints and Symptoms: Negative for: Frequent urination Integumentary (Skin) Complaints and Symptoms: Positive for: Wounds - wounds on both feet Psychiatric Complaints and Symptoms: Negative for: Claustrophobia; Suicidal Hematologic/Lymphatic Medical History: Positive for: Anemia Respiratory Medical History: Positive for: Sleep Apnea Negative for: Asthma; Chronic Obstructive Pulmonary Disease (COPD) Cardiovascular Medical History: Positive for: Congestive Heart Failure; Hypertension; Peripheral  Venous Disease Endocrine Medical History: Positive  for: Type II Diabetes Past Medical History Notes: Hypothyroidism Time with diabetes: 2017 Treated with: Diet Blood sugar tested every day: No Immunological Musculoskeletal Medical History: Positive for: Osteoarthritis Neurologic Medical History: Positive for: Neuropathy Oncologic Immunizations Pneumococcal Vaccine: Received Pneumococcal Vaccination: No Implantable Devices None Family and Social History Unknown History: Yes; Former smoker; Marital Status - Divorced; Alcohol Use: Never; Drug Use: Current History - Marijuana; Caffeine Use: Moderate; Financial Concerns: No; Food, Clothing or Shelter Needs: No; Support System Lacking: No; Transportation Concerns: No Electronic Signature(s) Signed: 10/31/2019 5:55:19 PM By: Lenda KelpStone III, Lashauna Arpin PA-C Signed: 11/01/2019 6:31:26 PM By: Zandra AbtsLynch, Shatara RN, BSN Entered By: Zandra AbtsLynch, Shatara on 10/31/2019 15:59:53 -------------------------------------------------------------------------------- SuperBill Details Patient Name: Date of Service: [name] BienMILLER, [name] 10/31/2019 Medical Record [id number] Patient Account Number: [id number] Date of Birth/Sex: Treating RN: 03/04/1961 (59 y.o. Damaris SchoonerM) Boehlein, Linda Primary Care Provider: Cheryll CockayneBurns, Stacy Other Clinician: Referring Provider: Treating Provider/Extender:Stone III, Dayton MartesHoyt Burns, Stacy Weeks in Treatment: 0 Diagnosis Coding ICD-10 Codes Code Description E11.622 Type 2 diabetes mellitus with other skin ulcer L97.522 Non-pressure chronic ulcer of other part of left foot with fat layer exposed L97.512 Non-pressure chronic ulcer of other part of right foot with fat layer exposed I10 Essential (primary) hypertension I50.42 Chronic combined systolic (congestive) and diastolic (congestive) heart failure I87.2 Venous insufficiency (chronic) (peripheral) Facility Procedures CPT4 Code Description: 9147829576100138 99213 - WOUND CARE VISIT-LEV 3 EST PT Modifier:  25 Quantity: 1 CPT4 Code Description: 6213086536100012 11042 - DEB SUBQ TISSUE 20 SQ CM/< ICD-10 Diagnosis Description L97.522 Non-pressure chronic ulcer of other part of left foot with L97.512 Non-pressure chronic ulcer of other part of right foot wit Modifier: fat layer h fat layer Quantity: 1 exposed exposed CPT4 Code Description: 7846962936100018 11045 - DEB SUBQ TISS EA ADDL 20CM ICD-10 Diagnosis Description L97.522 Non-pressure chronic ulcer of other part of left foot with L97.512 Non-pressure chronic ulcer of other part of right foot wit Modifier: fat layer h fat layer Quantity: 1 exposed exposed Physician Procedures CPT4 Code Description: 52841326770416 99213 - WC PHYS LEVEL 3 - EST PT ICD-10 Diagnosis Description E11.622 Type 2 diabetes mellitus with other skin ulcer L97.522 Non-pressure chronic ulcer of other part of left foot wit L97.512 Non-pressure chronic ulcer of  other part of right foot wi I10 Essential (primary) hypertension Modifier: 25 h fat layer exp th fat layer ex Quantity: 1 osed posed CPT4 Code Description: [id number] 11042 - WC PHYS SUBQ TISS 20 SQ CM ICD-10 Diagnosis Description L97.522 Non-pressure chronic ulcer of other part of left foot with fa L97.512 Non-pressure chronic ulcer of other part of right foot with f Modifier: t layer exposed at layer expose Quantity: 1 d CPT4 Code Description: [id number] 11045 - WC PHYS SUBQ TISS EA ADDL 20 CM ICD-10 Diagnosis Description L97.522 Non-pressure chronic ulcer of other part of left foot with fa L97.512 Non-pressure chronic ulcer of other part of right foot with f Modifier: t layer exposed at layer expose Quantity: 1 d Electronic Signature(s) Signed: 10/31/2019 5:43:44 PM By: Lenda KelpStone III, Desi Carby PA-C Entered By: Lenda KelpStone III, Soriah Leeman on 10/31/2019 17:43:44

## 2019-11-01 NOTE — Progress Notes (Addendum)
[name], [name] ([id number]) Visit Report for 10/31/2019 Allergy List Details Patient Name: Date of Service: [name], [name] 10/31/2019 2:45 PM Medical Record [id number] Patient Account Number: [id number] Date of Birth/Sex: Treating RN: [birth year] (58 y.o. Male) [name] Primary Care Leighanne Adolph: Billey Gosling Other Clinician: Referring Maresha Anastos: Treating Render Marley/Extender:Stone III, Elby Showers, Stacy Weeks in Treatment: 0 Allergies Active Allergies Claritin Reaction: anxiety Severity: Moderate Allergy Notes Electronic Signature(s) Signed: 11/01/2019 6:31:26 PM By: [name] RN, BSN Entered By: [name] on 10/31/2019 15:51:49 -------------------------------------------------------------------------------- [facility] Details Patient Name: Date of Service: [name], [name] 10/31/2019 2:45 PM Medical Record [id number] Patient Account Number: [id number] Date of Birth/Sex: Treating RN: Jan 01, 1961 (58 y.o. Male) [name] Primary Care Dannica Bickham: Billey Gosling Other Clinician: Referring Lacy Sofia: Treating Jaysten Essner/Extender:Stone III, Elby Showers, Stacy Weeks in Treatment: 0 Visit Information Patient Arrived: Ambulatory Arrival Time: 15:35 Accompanied By: alone Transfer Assistance: None Patient Identification Verified: Yes Secondary Verification Process Yes Completed: Patient Requires Transmission-Based No Precautions: Patient Has Alerts: Yes Patient Alerts: L ABI 1.07 (04/2019) R ABI 1.14 (04/2019) Electronic Signature(s) Signed: 11/01/2019 6:31:26 PM By: [name] RN, BSN Entered By: [name] on 10/31/2019 15:50:07 -------------------------------------------------------------------------------- Clinic Level of Care Assessment Details Patient Name: Date of Service: [name], [name] 10/31/2019 2:45 PM Medical Record [id number] Patient Account Number: [id number] Date of Birth/Sex: Treating RN: 1961-02-05 (58 y.o. Male) Baruch Gouty Primary Care Arneda Sappington: Billey Gosling Other Clinician: Referring Norely Schlick: Treating Brandis Matsuura/Extender:Stone III, Elby Showers, Stacy Weeks in Treatment: 0 Clinic Level of Care Assessment Items TOOL 1 Quantity Score []  - Use when EandM and Procedure is performed on INITIAL visit 0 ASSESSMENTS - Nursing Assessment / Reassessment X - General Physical Exam (combine w/ comprehensive assessment (listed just below) 1 20 when performed on new pt. evals) X - Comprehensive Assessment (HX, ROS, Risk Assessments, Wounds Hx, etc.) 1 25 ASSESSMENTS - Wound and Skin Assessment / Reassessment []  - Dermatologic / Skin Assessment (not related to wound area) 0 ASSESSMENTS - Ostomy and/or Continence Assessment and Care []  - Incontinence Assessment and Management 0 []  - Ostomy Care Assessment and Management (repouching, etc.) 0 PROCESS - Coordination of Care X - Simple Patient / Family Education for ongoing care 1 15 []  - Complex (extensive) Patient / Family Education for ongoing care 0 X - Staff obtains Programmer, systems, Records, Test Results / Process Orders 1 10 []  - Staff telephones HHA, Nursing Homes / Clarify orders / etc 0 []  - Routine Transfer to another Facility (non-emergent condition) 0 []  - Routine Hospital Admission (non-emergent condition) 0 X - New Admissions / Biomedical engineer / Ordering NPWT, Apligraf, etc. 1 15 []  - Emergency Hospital Admission (emergent condition) 0 PROCESS - Special Needs []  - Pediatric / Minor Patient Management 0 []  - Isolation Patient Management 0 []  - Hearing / Language / Visual special needs 0 []  - Assessment of Community assistance (transportation, D/C planning, etc.) 0 []  - Additional assistance / Altered mentation 0 []  - Support Surface(s) Assessment (bed, cushion, seat, etc.) 0 INTERVENTIONS - Miscellaneous []  - External ear exam 0 []  - Patient Transfer (multiple staff / Civil Service fast streamer / Similar devices) 0 []  - Simple Staple / Suture removal (25 or less)  0 []  - Complex Staple / Suture removal (26 or more) 0 []  - Hypo/Hyperglycemic Management (do not check if billed separately) 0 X - Ankle / Brachial Index (ABI) - do not check if billed separately 1 15 Has the patient been seen at the hospital within the last three years: Yes Total Score: 100 Level  Of Care: New/Established - Level 3 Electronic Signature(s) Signed: 10/31/2019 6:34:17 PM By: Zenaida Deed RN, BSN Entered By: Zenaida Deed on 10/31/2019 16:53:10 -------------------------------------------------------------------------------- Encounter Discharge Information Details Patient Name: Date of Service: [name], [name] 10/31/2019 2:45 PM Medical Record [id number] Patient Account Number: [id number] Date of Birth/Sex: Treating RN: September 06, 1961 (58 y.o. Male) Yevonne Pax Primary Care Zalika Tieszen: [name] Other Clinician: Referring Diamond Martucci: Treating Flo Berroa/Extender:Stone III, Dayton Martes, Stacy Weeks in Treatment: 0 Encounter Discharge Information Items Post Procedure Vitals Discharge Condition: Stable Temperature (F): 98.4 Ambulatory Status: Ambulatory Pulse (bpm): 96 Discharge Destination: Home Respiratory Rate (breaths/min): 18 Transportation: Private Auto Blood Pressure (mmHg): 133/64 Accompanied By: self Schedule Follow-up Appointment: Yes Clinical Summary of Care: Patient Declined Electronic Signature(s) Signed: 10/31/2019 6:22:51 PM By: Yevonne Pax RN Entered By: Yevonne Pax on 10/31/2019 17:22:32 -------------------------------------------------------------------------------- Lower Extremity Assessment Details Patient Name: Date of Service: [name], [name] 10/31/2019 2:45 PM Medical Record [id number] Patient Account Number: [id number] Date of Birth/Sex: Treating RN: [birth year] (58 y.o. Male) Zandra Abts Primary Care Dayton[place] Retana: [name] Other Clinician: Referring Roshonda Sperl: Treating Aniyah Nobis/Extender:Stone III, [place] Martes, Stacy Weeks in  Treatment: 0 Edema Assessment Assessed: [Left: No] [Right: No] Edema: [Left: Yes] [Right: Yes] Calf Left: Right: Point of Measurement: 30 cm From Medial Instep 45 cm 41 cm Ankle Left: Right: Point of Measurement: 11 cm From Medial Instep 28.8 cm 27.6 cm Vascular Assessment Pulses: Dorsalis Pedis Palpable: [Left:Yes] [Right:Yes] Electronic Signature(s) Signed: 11/01/2019 6:31:26 PM By: Zandra Abts RN, BSN Entered By: Zandra Abts on 10/31/2019 16:08:25 -------------------------------------------------------------------------------- Multi-Disciplinary Care Plan Details Patient Name: Date of Service: [name], [name] 10/31/2019 2:45 PM Medical Record [id number] Patient Account Number: [id number] Date of Birth/Sex: Treating RN: 18-Jan-1961 (58 y.o. Male) Zenaida Deed Primary Care Teale Goodgame: [name] Other Clinician: Referring Jerie Basford: Treating [place] Mckeough/Extender:Stone III, Dayton Martes, Stacy Weeks in Treatment: 0 Active Inactive Nutrition Nursing Diagnoses: Potential for alteratiion in Nutrition/Potential for imbalanced nutrition Goals: Patient/caregiver will maintain therapeutic glucose control Date Initiated: 10/31/2019 Target Resolution Date: 11/28/2019 Goal Status: Active Interventions: Assess HgA1c results as ordered upon admission and as needed Provide education on elevated blood sugars and impact on wound healing Treatment Activities: Patient referred to Primary Care Physician for further nutritional evaluation : 10/31/2019 Notes: Peripheral Neuropathy Nursing Diagnoses: Knowledge deficit related to disease process and management of peripheral neurovascular dysfunction Goals: Patient/caregiver will verbalize understanding of disease process and disease management Date Initiated: 10/31/2019 Target Resolution Date: 11/28/2019 Goal Status: Active Interventions: Assess signs and symptoms of neuropathy upon admission and as needed Provide education on  Management of Neuropathy and Related Ulcers Treatment Activities: Patient referred for customized footwear/offloading : 10/31/2019 Notes: Wound/Skin Impairment Nursing Diagnoses: Impaired tissue integrity Knowledge deficit related to ulceration/compromised skin integrity Goals: Patient/caregiver will verbalize understanding of skin care regimen Date Initiated: 10/31/2019 Target Resolution Date: 12/26/2019 Goal Status: Active Ulcer/skin breakdown will have a volume reduction of 30% by week 4 Date Initiated: 10/31/2019 Target Resolution Date: 11/28/2019 Goal Status: Active Interventions: Assess patient/caregiver ability to obtain necessary supplies Assess patient/caregiver ability to perform ulcer/skin care regimen upon admission and as needed Assess ulceration(s) every visit Provide education on ulcer and skin care Treatment Activities: Skin care regimen initiated : 10/31/2019 Topical wound management initiated : 10/31/2019 Notes: Electronic Signature(s) Signed: 10/31/2019 6:34:17 PM By: Zenaida Deed RN, BSN Entered By: Zenaida Deed on 10/31/2019 16:36:52 -------------------------------------------------------------------------------- Pain Assessment Details Patient Name: Date of Service: [name], [name] 10/31/2019 2:45 PM Medical Record [id number] Patient Account Number: [id number] Date of Birth/Sex: Treating RN: [birth year] (58 y.o. Male) Zandra Abts  Primary Care Shelbylynn Walczyk: [name] Other Clinician: Referring Maxxon Schwanke: Treating Maryclaire Stoecker/Extender:Stone III, Dayton Martes, Stacy Weeks in Treatment: 0 Active Problems Location of Pain Severity and Description of Pain Patient Has Paino No Site Locations Pain Management and Medication Current Pain Management: Electronic Signature(s) Signed: 11/01/2019 6:31:26 PM By: Zandra Abts RN, BSN Entered By: Zandra Abts on 10/31/2019  16:13:17 -------------------------------------------------------------------------------- Patient/Caregiver Education Details Patient Name: [name] 1/13/2021andnbsp2:45 Date of Service: PM Medical Record [id number] Number: Patient Account Number: [id number] Treating RN: 06-21-1961 (58 y.o. Zenaida Deed Date of Birth/Gender: Male) Other Clinician: Primary Care Treating Burns, Darlin Coco Physician: Physician/Extender: Referring Physician: Delrae Alfred in Treatment: 0 Education Assessment Education Provided To: Patient Education Topics Provided Elevated Blood Sugar/ Impact on Healing: Handouts: Elevated Blood Sugars: How Do They Affect Wound Healing Methods: Explain/Verbal, Printed Responses: Reinforcements needed, State content correctly Offloading: Handouts: What is Offloadingo Methods: Explain/Verbal, Printed Responses: Reinforcements needed, State content correctly Wound/Skin Impairment: Handouts: Caring for Your Ulcer, Skin Care Do's and Dont's Methods: Explain/Verbal Responses: Reinforcements needed, State content correctly Electronic Signature(s) Signed: 10/31/2019 6:34:17 PM By: Zenaida Deed RN, BSN Entered By: Zenaida Deed on 10/31/2019 16:37:36 -------------------------------------------------------------------------------- Wound Assessment Details Patient Name: Date of Service: [name], [name] 10/31/2019 2:45 PM Medical Record [id number] Patient Account Number: [id number] Date of Birth/Sex: Treating RN: [birth year] (58 y.o. Male) Zandra Abts Primary Care Kaladin Noseworthy: [name] Other Clinician: Referring Jacklyn Branan: Treating Mishka Stegemann/Extender:Stone III, [place] Martes, Stacy Weeks in Treatment: 0 Wound Status Wound Number: 1 Primary Diabetic Wound/Ulcer of the Lower Extremity Etiology: Wound Location: Left Foot - Plantar Wound Open Wounding Event: Gradually Appeared Status: Date Acquired: 02/16/2019 Comorbid Anemia, Sleep  Apnea, Congestive Heart Weeks Of Treatment: 0 History: Failure, Hypertension, Peripheral Venous Clustered Wound: No Disease, Cirrhosis , Type II Diabetes, Osteoarthritis, Neuropathy Photos Wound Measurements Length: (cm) 3 % Reduction in Area Width: (cm) 3.8 % Reduction in Volu Depth: (cm) 0.7 Epithelialization: Area: (cm) 8.954 Tunneling: Volume: (cm) 6.267 Undermining: Wound Description Classification: Grade 2 Foul Odor After Cl Wound Margin: Flat and Intact Slough/Fibrino Exudate Amount: Medium Exudate Type: Serosanguineous Exudate Color: red, brown Wound Bed Granulation Amount: Medium (34-66%) E Granulation Quality: Pink Fascia Exposed: Necrotic Amount: Medium (34-66%) Fat Layer (Subcutan Necrotic Quality: Adherent Slough Tendon Exposed: Muscle Exposed: Joint Exposed: Bone Exposed: eansing: No Yes xposed Structure No eous Tissue) Exposed: Yes No No No No : 0% me: 0% None No No Treatment Notes Wound #1 (Left, Plantar Foot) 1. Cleanse With Wound Cleanser Soap and water 3. Primary Dressing Applied Calcium Alginate Ag 4. Secondary Dressing Dry Gauze Roll Gauze Drawtex Notes tape. netting Electronic Signature(s) Signed: 11/05/2019 6:02:32 PM By: Zandra Abts RN, BSN Signed: 11/06/2019 4:37:08 PM By: Benjaman Kindler EMT/HBOT Previous Signature: 11/01/2019 6:31:26 PM Version By: Zandra Abts RN, BSN Entered By: Benjaman Kindler on 11/05/2019 15:50:36 -------------------------------------------------------------------------------- Wound Assessment Details Patient Name: Date of Service: [name], [name] 10/31/2019 2:45 PM Medical Record [id number] Patient Account Number: [id number] Date of Birth/Sex: Treating RN: [birth year] (58 y.o. Male) Zandra Abts Primary Care Mayerly Kaman: [name] Other Clinician: Referring Jajuan Skoog: Treating Nicklos Gaxiola/Extender:Stone III, [place] Martes, Stacy Weeks in Treatment: 0 Wound Status Wound Number: 2 Primary  Diabetic Wound/Ulcer of the Lower Extremity Etiology: Wound Location: Right Metatarsal head second Wound Open Wounding Event: Gradually Appeared Status: Date Acquired: 02/16/2019 Comorbid Anemia, Sleep Apnea, Congestive Heart Weeks Of Treatment: 0 History: Failure, Hypertension, Peripheral Venous Clustered Wound: No Disease, Cirrhosis , Type II Diabetes, Osteoarthritis, Neuropathy Photos Wound Measurements Length: (cm) 2 Width: (cm) 1.4 Depth: (cm)  1.2 Area: (cm) 2.199 Volume: (cm) 2.639 Wound Description Classification: Grade 2 Wound Margin: Flat and Intact Exudate Amount: Medium Exudate Type: Serosanguineous Exudate Color: red, brown Wound Bed Granulation Amount: Medium (34-66%) Necrotic Amount: Medium (34-66%) Necrotic Quality: Adherent Slough After Cleansing: No rino Yes Exposed Structure sed: No Subcutaneous Tissue) Exposed: Yes sed: No sed: No ed: No d: No % Reduction in Area: 0% % Reduction in Volume: 0% Epithelialization: None Tunneling: No Undermining: No Foul Odor Slough/Fib Fascia Expo Fat Layer ( Tendon Expo Muscle Expo Joint Expos Bone Expose Treatment Notes Wound #2 (Right Metatarsal head second) 1. Cleanse With Wound Cleanser Soap and water 3. Primary Dressing Applied Calcium Alginate Ag 4. Secondary Dressing Dry Gauze Roll Gauze Drawtex Notes tape. netting Electronic Signature(s) Signed: 11/05/2019 6:02:32 PM By: Zandra Abts RN, BSN Signed: 11/06/2019 4:37:08 PM By: Benjaman Kindler EMT/HBOT Previous Signature: 11/01/2019 6:31:26 PM Version By: Zandra Abts RN, BSN Entered By: Benjaman Kindler on 11/05/2019 15:50:59 -------------------------------------------------------------------------------- Vitals Details Patient Name: Date of Service: [name], [name] 10/31/2019 2:45 PM Medical Record [id number] Patient Account Number: [id number] Date of Birth/Sex: Treating RN: [birth year] (58 y.o. Male) Zandra Abts Primary Care  Shaniya Tashiro: [name] Other Clinician: Referring Arijana Narayan: Treating Sharde Gover/Extender:Stone III, Dayton Martes, Stacy Weeks in Treatment: 0 Vital Signs Time Taken: 15:40 Temperature (F): 98.4 Height (in): 73 Pulse (bpm): 96 Source: Stated Respiratory Rate (breaths/min): 18 Weight (lbs): 260 Blood Pressure (mmHg): 133/64 Source: Stated Reference Range: 80 - 120 mg / dl Body Mass Index (BMI): 34.3 Electronic Signature(s) Signed: 11/01/2019 6:31:26 PM By: Zandra Abts RN, BSN Entered By: Zandra Abts on 10/31/2019 15:51:45

## 2019-11-01 NOTE — Progress Notes (Signed)
MAIJOR, HORNIG ([id number]) Visit Report for 10/31/2019 Abuse/Suicide Risk Screen Details Patient Name: Date of Service: [name], [name] 10/31/2019 2:45 PM Medical Record [id number] Patient Account Number: [id number] Date of Birth/Sex: Treating RN: December 30, 1960 (59 y.o. [name] Primary Care Nickole Adamek: Billey Gosling Other Clinician: Referring Daymen Hassebrock: Treating Shavaun Osterloh/Extender:Stone III, Elby Showers, Stacy Weeks in Treatment: 0 Abuse/Suicide Risk Screen Items Answer ABUSE RISK SCREEN: Has anyone close to you tried to hurt or harm you recentlyo No Do you feel uncomfortable with anyone in your familyo No Has anyone forced you do things that you didnt want to doo No Electronic Signature(s) Signed: 11/01/2019 6:31:26 PM By: [name] RN, BSN Entered By: [name] on 10/31/2019 15:59:58 -------------------------------------------------------------------------------- Activities of Daily Living Details Patient Name: Date of Service: [name], [name] 10/31/2019 2:45 PM Medical Record [id number] Patient Account Number: [id number] Date of Birth/Sex: Treating RN: Sep 04, 1961 (59 y.o. [name] Primary Care Kota Ciancio: Billey Gosling Other Clinician: Referring Jahshua Bonito: Treating Malaysha Arlen/Extender:Stone III, Elby Showers, Stacy Weeks in Treatment: 0 Activities of Daily Living Items Answer Activities of Daily Living (Please select one for each item) Drive Automobile Completely Able Take Medications Completely Able Use Telephone Completely Able Care for Appearance Completely Able Use Toilet Completely Able Bath / Shower Completely Able Dress Self Completely Able Feed Self Completely Able Walk Completely Able Get In / Out Bed Completely Able Housework Completely Able Prepare Meals Completely [place] for Self Completely Able Electronic Signature(s) Signed: 11/01/2019 6:31:26 PM By: [name] RN, BSN Entered By: [name] on  10/31/2019 16:00:14 -------------------------------------------------------------------------------- Education Screening Details Patient Name: Date of Service: [name], [name] 10/31/2019 2:45 PM Medical Record [id number] Patient Account Number: [id number] Date of Birth/Sex: Treating RN: 11-02-1960 (59 y.o. [name] Primary Care Enis Riecke: Billey Gosling Other Clinician: Referring Jotham Ahn: Treating Malva Diesing/Extender:Stone III, Elby Showers, Nadine Counts in Treatment: 0 Primary Learner Assessed: Patient Learning Preferences/Education Level/Primary Language Learning Preference: Explanation, Demonstration, Printed Material Highest Education Level: College or Above Preferred Language: English Cognitive Barrier Language Barrier: No Translator Needed: No Memory Deficit: No Emotional Barrier: No Cultural/Religious Beliefs Affecting Medical Care: No Physical Barrier Impaired Vision: No Impaired Hearing: No Decreased Hand dexterity: No Knowledge/Comprehension Knowledge Level: High Comprehension Level: High Ability to understand written High instructions: Ability to understand verbal High instructions: Motivation Anxiety Level: Calm Cooperation: Cooperative Education Importance: Acknowledges Need Interest in Health Problems: Asks Questions Perception: Coherent Willingness to Engage in Self- High Management Activities: Readiness to Engage in Self- High Management Activities: Electronic Signature(s) Signed: 11/01/2019 6:31:26 PM By: [name] RN, BSN Entered By: [name] on 10/31/2019 16:00:31 -------------------------------------------------------------------------------- Fall Risk Assessment Details Patient Name: Date of Service: [name], [name] 10/31/2019 2:45 PM Medical Record [id number] Patient Account Number: [id number] Date of Birth/Sex: Treating RN: 06-30-1961 (59 y.o. [name] Primary Care Charline Hoskinson: Billey Gosling Other  Clinician: Referring Qamar Aughenbaugh: Treating Anaisabel Pederson/Extender:Stone III, Elby Showers, Stacy Weeks in Treatment: 0 Fall Risk Assessment Items Have you had 2 or more falls in the last 12 monthso 0 No Have you had any fall that resulted in injury in the last 12 monthso 0 No FALLS RISK SCREEN History of falling - immediate or within 3 months 0 No Secondary diagnosis (Do you have 2 or more medical diagnoseso) 15 Yes Ambulatory aid None/bed rest/wheelchair/nurse 0 Yes Crutches/cane/walker 0 No Furniture 0 No Intravenous therapy Access/Saline/Heparin Lock 0 No Weak (short steps with or without shuffle, stooped but able to lift head 0 No while walking, may seek support from furniture) Impaired (short  steps with shuffle, may have difficulty arising from chair, 0 No head down, impaired balance) Mental Status Oriented to own ability 0 Yes Overestimates or forgets limitations 0 No Risk Level: Low Risk Score: 15 Electronic Signature(s) Signed: 11/01/2019 6:31:26 PM By: Zandra Abts RN, BSN Entered By: Zandra Abts on 10/31/2019 16:00:50 -------------------------------------------------------------------------------- Foot Assessment Details Patient Name: Date of Service: [name], [name] 10/31/2019 2:45 PM Medical Record [id number] Patient Account Number: [id number] Date of Birth/Sex: Treating RN: 08-27-1961 (59 y.o. [name] Primary Care Claudeen Leason: [name] Other Clinician: Referring Nikesha Kwasny: Treating Breonna Gafford/Extender:Stone III, Dayton Martes, Stacy Weeks in Treatment: 0 Foot Assessment Items Site Locations + = Sensation present, - = Sensation absent, C = Callus, U = Ulcer R = Redness, W = Warmth, M = Maceration, PU = Pre-ulcerative lesion F = Fissure, S = Swelling, D = Dryness Assessment Right: Left: Other Deformity: No No Prior Foot Ulcer: No No Prior Amputation: No No Charcot Joint: No No Ambulatory Status: Ambulatory Without Help Gait: Steady Electronic  Signature(s) Signed: 11/01/2019 6:31:26 PM By: Zandra Abts RN, BSN Entered By: Zandra Abts on 10/31/2019 16:02:33 -------------------------------------------------------------------------------- Nutrition Risk Screening Details Patient Name: Date of Service: [name], [name] 10/31/2019 2:45 PM Medical Record [id number] Patient Account Number: [id number] Date of Birth/Sex: Treating RN: 30-Jun-1961 (59 y.o. [name] Primary Care Earma Nicolaou: [name] Other Clinician: Referring Muzamil Harker: Treating Shedrick Sarli/Extender:Stone III, Dayton Martes, Stacy Weeks in Treatment: 0 Height (in): 73 Weight (lbs): 260 Body Mass Index (BMI): 34.3 Nutrition Risk Screening Items Score Screening NUTRITION RISK SCREEN: I have an illness or condition that made me change the kind and/or 2 Yes amount of food I eat I eat fewer than two meals per day 0 No I eat few fruits and vegetables, or milk products 0 No I have three or more drinks of beer, liquor or wine almost every day 0 No I have tooth or mouth problems that make it hard for me to eat 0 No I don't always have enough money to buy the food I need 0 No I eat alone most of the time 0 No I take three or more different prescribed or over-the-counter drugs a day 1 Yes 0 No Without wanting to, I have lost or gained 10 pounds in the last six months I am not always physically able to shop, cook and/or feed myself 0 No Nutrition Protocols Good Risk Protocol Provide education on Moderate Risk Protocol 0 nutrition High Risk Proctocol Risk Level: Moderate Risk Score: 3 Electronic Signature(s) Signed: 11/01/2019 6:31:26 PM By: Zandra Abts RN, BSN Entered By: Zandra Abts on 10/31/2019 16:01:20

## 2019-11-07 ENCOUNTER — Encounter (HOSPITAL_BASED_OUTPATIENT_CLINIC_OR_DEPARTMENT_OTHER): Payer: Medicare (Managed Care) | Admitting: Physician Assistant

## 2019-11-07 ENCOUNTER — Other Ambulatory Visit: Payer: Self-pay

## 2019-11-07 DIAGNOSIS — E11621 Type 2 diabetes mellitus with foot ulcer: Secondary | ICD-10-CM | POA: Diagnosis not present

## 2019-11-07 NOTE — Progress Notes (Addendum)
[name] ([id number]) Visit Report for 11/07/2019 Arrival Information Details Patient Name: Date of Service: [name], [name] 11/07/2019 12:30 PM Medical Record [id number] Patient Account Number: [id number] Date of Birth/Sex: Treating RN: June 29, 1961 (59 y.o. [name] Primary Care Marquist Binstock: Billey Gosling Other Clinician: Referring Mickie Kozikowski: Treating Weda Baumgarner/Extender:Stone III, Elby Showers, Stacy Weeks in Treatment: 1 Visit Information History Since Last Visit Added or deleted any medications: No Patient Arrived: [name] Any new allergies or adverse reactions: No Arrival Time: 13:02 Had a fall or experienced change in No Accompanied By: self activities of daily living that may affect Transfer Assistance: None risk of falls: Patient Identification Verified: Yes Signs or symptoms of abuse/neglect since last No Secondary Verification Process Yes visito Completed: Hospitalized since last visit: No Patient Requires Transmission-Based No Implantable device outside of the clinic excluding No Precautions: cellular tissue based products placed in the center Patient Has Alerts: Yes since last visit: Patient Alerts: L ABI 1.07 Has Dressing in Place as Prescribed: Yes (04/2019) Pain Present Now: No R ABI 1.14 (04/2019) Electronic Signature(s) Signed: 11/07/2019 6:03:18 PM By: Baruch Gouty RN, BSN Entered By: Baruch Gouty on 11/07/2019 13:03:26 -------------------------------------------------------------------------------- Encounter Discharge Information Details Patient Name: Date of Service: [name] 11/07/2019 12:30 PM Medical Record [id number] Patient Account Number: [id number] Date of Birth/Sex: Treating RN: Feb 26, 1961 (59 y.o. [name] Primary Care Jamerion Cabello: Billey Gosling Other Clinician: Referring Daylan Boggess: Treating Navah Grondin/Extender:Stone III, Elby Showers, Stacy Weeks in Treatment: 1 Encounter Discharge Information Items Post Procedure  Vitals Discharge Condition: Stable Temperature (F): 98.5 Ambulatory Status: Ambulatory Pulse (bpm): 74 Discharge Destination: Home Respiratory Rate (breaths/min): 18 Transportation: Private Auto Blood Pressure (mmHg): 145/81 Accompanied By: self Schedule Follow-up Appointment: Yes Clinical Summary of Care: Patient Declined Electronic Signature(s) Signed: 11/07/2019 5:42:11 PM By: Carlene Coria RN Entered By: Carlene Coria on 11/07/2019 14:30:46 -------------------------------------------------------------------------------- Lower Extremity Assessment Details Patient Name: Date of Service: [name] 11/07/2019 12:30 PM Medical Record [id number] Patient Account Number: [id number] Date of Birth/Sex: Treating RN: 1961/01/29 (59 y.o. [name] Primary Care Tremel Setters: Billey Gosling Other Clinician: Referring Chellsie Gomer: Treating Javarion Douty/Extender:Stone III, Elby Showers, Stacy Weeks in Treatment: 1 Edema Assessment Assessed: [Left: No] [Right: No] Edema: [Left: Yes] [Right: Yes] Calf Left: Right: Point of Measurement: 30 cm From Medial Instep 45 cm 42 cm Ankle Left: Right: Point of Measurement: 11 cm From Medial Instep 28.7 cm 27.5 cm Vascular Assessment Pulses: Dorsalis Pedis Palpable: [Left:Yes] [Right:Yes] Electronic Signature(s) Signed: 11/07/2019 6:03:18 PM By: Baruch Gouty RN, BSN Entered By: Baruch Gouty on 11/07/2019 13:10:02 -------------------------------------------------------------------------------- Multi-Disciplinary Care Plan Details Patient Name: Date of Service: [name] 11/07/2019 12:30 PM Medical Record [id number] Patient Account Number: [id number] Date of Birth/Sex: Treating RN: [birth year] (59 y.o. [name] Primary Care Dorothea Yow: Other Clinician: Billey Gosling Referring Mushka Laconte: Treating Loana Salvaggio/Extender:Stone III, Elby Showers, Stacy Weeks in Treatment: 1 Active Inactive Nutrition Nursing Diagnoses: Potential for  alteratiion in Nutrition/Potential for imbalanced nutrition Goals: Patient/caregiver will maintain therapeutic glucose control Date Initiated: 10/31/2019 Target Resolution Date: 11/28/2019 Goal Status: Active Interventions: Assess HgA1c results as ordered upon admission and as needed Provide education on elevated blood sugars and impact on wound healing Treatment Activities: Patient referred to Primary Care Physician for further nutritional evaluation : 10/31/2019 Notes: Peripheral Neuropathy Nursing Diagnoses: Knowledge deficit related to disease process and management of peripheral neurovascular dysfunction Goals: Patient/caregiver will verbalize understanding of disease process and disease management Date Initiated: 10/31/2019 Target Resolution Date: 11/28/2019 Goal Status: Active Interventions: Assess signs and symptoms of neuropathy upon admission and as  needed Provide education on Management of Neuropathy and Related Ulcers Treatment Activities: Patient referred for customized footwear/offloading : 10/31/2019 Notes: Wound/Skin Impairment Nursing Diagnoses: Impaired tissue integrity Knowledge deficit related to ulceration/compromised skin integrity Goals: Patient/caregiver will verbalize understanding of skin care regimen Date Initiated: 10/31/2019 Target Resolution Date: 12/26/2019 Goal Status: Active Ulcer/skin breakdown will have a volume reduction of 30% by week 4 Date Initiated: 10/31/2019 Target Resolution Date: 11/28/2019 Goal Status: Active Interventions: Assess patient/caregiver ability to obtain necessary supplies Assess patient/caregiver ability to perform ulcer/skin care regimen upon admission and as needed Assess ulceration(s) every visit Provide education on ulcer and skin care Treatment Activities: Skin care regimen initiated : 10/31/2019 Topical wound management initiated : 10/31/2019 Notes: Electronic Signature(s) Signed: 11/07/2019 6:03:18 PM By: Zenaida Deed RN, BSN Entered By: Zenaida Deed on 11/07/2019 13:45:06 -------------------------------------------------------------------------------- Pain Assessment Details Patient Name: Date of Service: KALOB, BERGEN 11/07/2019 12:30 PM Medical Record [id number] Patient Account Number: [id number] Date of Birth/Sex: Treating RN: 17-Feb-1961 (59 y.o. [name] Primary Care Dorris Pierre: [name] Other Clinician: Referring Shakeem Stern: Treating Alayasia Breeding/Extender:Stone III, Dayton Martes, Stacy Weeks in Treatment: 1 Active Problems Location of Pain Severity and Description of Pain Patient Has Paino No Site Locations Rate the pain. Current Pain Level: 0 Pain Management and Medication Current Pain Management: Electronic Signature(s) Signed: 11/07/2019 6:03:18 PM By: Zenaida Deed RN, BSN Entered By: Zenaida Deed on 11/07/2019 13:04:34 -------------------------------------------------------------------------------- Patient/Caregiver Education Details Patient Name: [name] 1/20/2021andnbsp12:30 Date of Service: PM Medical Record [id number] Number: Patient Account Number: [id number] Treating RN: 1961/03/05 (59 y.o. Zenaida Deed Date of Birth/Gender: M) Other Clinician: Primary Care Physician: [name] Treating Lenda Kelp Referring Physician: Physician/Extender: Delrae Alfred in Treatment: 1 Education Assessment Education Provided To: Patient Education Topics Provided Offloading: Methods: Explain/Verbal Responses: Reinforcements needed, State content correctly Wound/Skin Impairment: Methods: Explain/Verbal Responses: Reinforcements needed, State content correctly Electronic Signature(s) Signed: 11/07/2019 6:03:18 PM By: Zenaida Deed RN, BSN Entered By: Zenaida Deed on 11/07/2019 13:46:25 -------------------------------------------------------------------------------- Wound Assessment Details Patient Name: Date of Service: GUILLERMO, NEHRING 11/07/2019 12:30 PM Medical Record [id number] Patient Account Number: [id number] Date of Birth/Sex: Treating RN: Feb 28, 1961 (59 y.o. [name] Primary Care Lonette Stevison: [name] Other Clinician: Referring Iyonnah Ferrante: Treating Ferry Matthis/Extender:Stone III, Dayton Martes, Stacy Weeks in Treatment: 1 Wound Status Wound Number: 1 Primary Diabetic Wound/Ulcer of the Lower Extremity Etiology: Wound Location: Left Foot - Plantar Wound Open Wounding Event: Gradually Appeared Status: Status: Date Acquired: 02/16/2019 Comorbid Anemia, Sleep Apnea, Congestive Heart Weeks Of Treatment: 1 History: Failure, Hypertension, Peripheral Venous Clustered Wound: No Disease, Cirrhosis , Type II Diabetes, Osteoarthritis, Neuropathy Photos Wound Measurements Length: (cm) 3.6 % Reduc Width: (cm) 3.7 % Reduc Depth: (cm) 0.6 Epithel Area: (cm) 10.462 Tunnel Volume: (cm) 6.277 Underm Wound Description Classification: Grade 2 Wound Margin: Flat and Intact Exudate Amount: Medium Exudate Type: Serosanguineous Exudate Color: red, brown Wound Bed Granulation Amount: Medium (34-66%) Granulation Quality: Pink Necrotic Amount: Medium (34-66%) Necrotic Quality: Adherent Slough Foul Odor After Cleansing: No Slough/Fibrino Yes Exposed Structure Fascia Exposed: No Fat Layer (Subcutaneous Tissue) Exposed: Yes Tendon Exposed: No Muscle Exposed: No Joint Exposed: No Bone Exposed: No tion in Area: -16.8% tion in Volume: -0.2% ialization: None ing: No ining: No Treatment Notes Wound #1 (Left, Plantar Foot) 1. Cleanse With Wound Cleanser Soap and water 3. Primary Dressing Applied Calcium Alginate Ag Other primary dressing (specifiy in notes) 4. Secondary Dressing Dry Gauze Foam 5. Secured With Tape 7. Footwear/Offloading device applied Total Contact Cast Notes foam around  great toe and second toe, drawtex over calcium alginate with silver . netting Electronic  Signature(s) Signed: 11/08/2019 4:35:12 PM By: Benjaman Kindler EMT/HBOT Signed: 11/08/2019 5:58:49 PM By: Zenaida Deed RN, BSN Previous Signature: 11/07/2019 6:03:18 PM Version By: Zenaida Deed RN, BSN Entered By: Benjaman Kindler on 11/08/2019 14:54:33 -------------------------------------------------------------------------------- Wound Assessment Details Patient Name: Date of Service: CELESTINE, BOUGIE 11/07/2019 12:30 PM Medical Record [id number] Patient Account Number: [id number] Date of Birth/Sex: Treating RN: 25-Oct-1960 (59 y.o. [name] Primary Care Oris Calmes: [name] Other Clinician: Referring Ercie Eliasen: Treating Tova Vater/Extender:Stone III, Dayton Martes, Stacy Weeks in Treatment: 1 Wound Status Wound Number: 2 Primary Diabetic Wound/Ulcer of the Lower Extremity Etiology: Wound Location: Right Metatarsal head second Wound Open Wounding Event: Gradually Appeared Status: Date Acquired: 02/16/2019 Comorbid Anemia, Sleep Apnea, Congestive Heart Weeks Of Treatment: 1 History: Failure, Hypertension, Peripheral Venous Clustered Wound: No Disease, Cirrhosis , Type II Diabetes, Osteoarthritis, Neuropathy Photos Wound Measurements Length: (cm) 2.2 Width: (cm) 1.5 Depth: (cm) 0.9 Area: (cm) 2.592 Volume: (cm) 2.333 % Reduction in Area: -17.9% % Reduction in Volume: 11.6% Epithelialization: Small (1-33%) Tunneling: No Undermining: Yes Starting Position (o'clock): 10 Ending Position (o'clock): 1 Maximum Distance: (cm) 0.4 Wound Description Classification: Grade 2 Wound Margin: Flat and Intact Exudate Amount: Medium Exudate Type: Serosanguineous Exudate Color: red, brown Wound Bed Granulation Amount: Medium (34-66%) Granulation Quality: Pink Necrotic Amount: Medium (34-66%) Necrotic Quality: Adherent Slough Foul Odor After Cleansing: No Slough/Fibrino Yes Exposed Structure Fascia Exposed: No Fat Layer (Subcutaneous Tissue) Exposed: Yes Tendon  Exposed: No Muscle Exposed: No Joint Exposed: No Bone Exposed: No Treatment Notes Wound #2 (Right Metatarsal head second) 1. Cleanse With Wound Cleanser Soap and water 3. Primary Dressing Applied Calcium Alginate Ag Other primary dressing (specifiy in notes) 4. Secondary Dressing Dry Gauze Roll Gauze 5. Secured With Tape Notes drawtex, [employer] social research officer) Signed: 11/08/2019 4:35:12 PM By: Benjaman Kindler EMT/HBOT Signed: 11/08/2019 5:58:49 PM By: Zenaida Deed RN, BSN Previous Signature: 11/07/2019 6:03:18 PM Version By: Zenaida Deed RN, BSN Entered By: Benjaman Kindler on 11/08/2019 14:54:16 -------------------------------------------------------------------------------- Vitals Details Patient Name: Date of Service: ARUSH, GATLIFF 11/07/2019 12:30 PM Medical Record [id number] Patient Account Number: [id number] Date of Birth/Sex: Treating RN: Oct 07, 1961 (59 y.o. [name] Primary Care Jonny Longino: [name] Other Clinician: Referring Melondy Blanchard: Treating Nayah Lukens/Extender:Stone III, Dayton Martes, Stacy Weeks in Treatment: 1 Vital Signs Time Taken: 13:03 Temperature (F): 98.5 Height (in): 73 Pulse (bpm): 74 Source: Stated Respiratory Rate (breaths/min): 18 Weight (lbs): 260 Blood Pressure (mmHg): 145/81 Source: Stated Reference Range: 80 - 120 mg / dl Body Mass Index (BMI): 34.3 Electronic Signature(s) Signed: 11/07/2019 6:03:18 PM By: Zenaida Deed RN, BSN Entered By: Zenaida Deed on 11/07/2019 13:04:06

## 2019-11-07 NOTE — Progress Notes (Addendum)
[name] ([id number]) Visit Report for 11/07/2019 Chief Complaint Document Details Patient Name: Date of Service: [name], [name] 11/07/2019 12:30 PM Medical Record [id number] Patient Account Number: [id number] Date of Birth/Sex: Treating RN: 07/03/1961 (59 y.o. M) Primary Care Provider: [name] Other Clinician: Referring Provider: Treating Provider/Extender:Stone III, Dayton Martes, Stacy Weeks in Treatment: 1 Information Obtained from: Patient Chief Complaint Bilateral foot diabetic ulcers Electronic Signature(s) Signed: 11/07/2019 1:11:44 PM By: Lenda Kelp PA-C Entered By: Lenda Kelp on 11/07/2019 13:11:44 -------------------------------------------------------------------------------- Debridement Details Patient Name: Date of Service: [name], [name] 11/07/2019 12:30 PM Medical Record [id number] Patient Account Number: [id number] Date of Birth/Sex: 05-23-1961 (59 y.o. M) Treating RN: Zenaida Deed Primary Care Provider: [name] Other Clinician: Referring Provider: Treating Provider/Extender:Stone III, Dayton Martes, Stacy Weeks in Treatment: 1 Debridement Performed for Wound #2 Right Metatarsal head second Assessment: Performed By: Physician Lenda Kelp, PA Debridement Type: Debridement Severity of Tissue Pre Fat layer exposed Debridement: Level of Consciousness (Pre- Awake and Alert procedure): Pre-procedure Verification/Time Out Taken: Yes - 13:48 Start Time: 13:49 Pain Control: Lidocaine 5% topical ointment Total Area Debrided (L x W): 2.5 (cm) x 1.6 (cm) = 4 (cm) Tissue and other material Non-Viable, Callus, Skin: Epidermis debrided: Level: Skin/Epidermis Debridement Description: Selective/Open Wound Instrument: Curette Bleeding: Minimum Hemostasis Achieved: Pressure End Time: 13:50 Procedural Pain: 0 Post Procedural Pain: 0 Response to Treatment: Procedure was tolerated well Level of Consciousness Awake and  Alert (Post-procedure): Post Debridement Measurements of Total Wound Length: (cm) 2.5 Width: (cm) 1.6 Depth: (cm) 0.9 Volume: (cm) 2.827 Character of Wound/Ulcer Post Improved Debridement: Severity of Tissue Post Debridement: Fat layer exposed Post Procedure Diagnosis Same as Pre-procedure Electronic Signature(s) Signed: 11/07/2019 6:03:18 PM By: Zenaida Deed RN, BSN Signed: 11/07/2019 7:03:16 PM By: Lenda Kelp PA-C Entered By: Zenaida Deed on 11/07/2019 13:54:00 -------------------------------------------------------------------------------- HPI Details Patient Name: Date of Service: [name], [name] 11/07/2019 12:30 PM Medical Record [id number] Patient Account Number: [id number] Date of Birth/Sex: Treating RN: 11-23-1960 (59 y.o. M) Primary Care Provider: [name] Other Clinician: Referring Provider: Treating Provider/Extender:Stone III, Dayton Martes, Stacy Weeks in Treatment: 1 History of Present Illness HPI Description: 10/31/2019 upon evaluation today patient appears to be doing somewhat poorly in regard to his bilateral plantar feet. He has wounds that he tells me have been present since 2012 intermittently off and on. Most recently this has been open for at least the past 6 months to a year. He has been trying to treat this in different ways using Santyl along with various other dressings including Medihoney and even at one point Xeroform. Nothing really has seem to get this completely closed. He was recently in the hospital for cellulitis of his leg subsequently he did have x-rays as well as MRIs that showed negative for any signs of osteomyelitis in regard to the wounds on his feet. Fortunately there is no signs of systemic infection at this time. No fevers, chills, nausea, vomiting, or diarrhea. Patient has previously used Darco offloading shoes as far as frontal floaters as well as postop surgical shoes. He has never been in a total contact cast that may  be something we need to strongly consider here. Patient's most recent hemoglobin A1c 1 month ago was 5.3 seems to be very well controlled which is great. Subsequently he has seen vascular as well as podiatry. His ABIs are 1.07 on the left and 1.14 on the right he seems to be doing well he does have chronic venous stasis. 11/07/2019 upon evaluation today patient  appears to be doing well with regard to his wounds all things considered. I do not see any severe worsening he still has some callus buildup on the right more than the left he notes that he has been probably more active than he should as far as walking is concerned is just very hard to not be active. He knows he needs to be more careful in this regard however. He is willing to give the cast a try at this point although he notes that he is a little nervous about this just with regard to balance although he will be very careful and obviously if he has any trouble he knows to contact the office and let me know. Electronic Signature(s) Signed: 11/07/2019 1:54:46 PM By: Lenda KelpStone III, Laiana Fratus PA-C Entered By: Lenda KelpStone III, Zamariya Neal on 11/07/2019 13:54:46 -------------------------------------------------------------------------------- Physical Exam Details Patient Name: Date of Service: [name], [name] 11/07/2019 12:30 PM Medical Record [id number] Patient Account Number: [id number] Date of Birth/Sex: Treating RN: 01/30/1961 (59 y.o. M) Primary Care Provider: Cheryll CockayneBurns, Stacy Other Clinician: Referring Provider: Treating Provider/Extender:Stone III, Dayton MartesHoyt Burns, Stacy Weeks in Treatment: 1 Constitutional Well-nourished and well-hydrated in no acute distress. Respiratory normal breathing without difficulty. Psychiatric this patient is able to make decisions and demonstrates good insight into disease process. Alert and Oriented x 3. pleasant and cooperative. Notes Patient's wound bed actually showed signs of good granulation at this time. Fortunately  there is no signs of active infection which is excellent as well he did have some callus noted on the right plantar foot that did require sharp debridement this was just mainly debriding away callus no slough and no surface of the wound or subcutaneous tissue were debrided away today. Subsequently we then did proceed to apply the total contact cast of the left foot the right foot was the one which was debrided. Post to we did pad between the first and second toes of the left foot in order to prevent anything from pressing or rubbing causing an issue here because of how they overlap. Also I did leave some extra room as well when applying the cast so that hopefully this will not cause any pressure or rubbing which would not be good. Patient tolerated the application of the total contact cast without complication. Electronic Signature(s) Signed: 11/07/2019 1:55:58 PM By: Lenda KelpStone III, Keanna Tugwell PA-C Entered By: Lenda KelpStone III, Elmina Hendel on 11/07/2019 13:55:57 -------------------------------------------------------------------------------- Physician Orders Details Patient Name: Date of Service: [name], [name] 11/07/2019 12:30 PM Medical Record [id number] Patient Account Number: [id number] Date of Birth/Sex: Treating RN: 08/08/1961 (59 y.o. Damaris SchoonerM) Boehlein, Linda Primary Care Provider: Cheryll CockayneBurns, Stacy Other Clinician: Referring Provider: Treating Provider/Extender:Stone III, Dayton MartesHoyt Burns, Duanne LimerickStacy Weeks in Treatment: 1 Verbal / Phone Orders: No Diagnosis Coding ICD-10 Coding Code Description E11.622 Type 2 diabetes mellitus with other skin ulcer L97.522 Non-pressure chronic ulcer of other part of left foot with fat layer exposed L97.512 Non-pressure chronic ulcer of other part of right foot with fat layer exposed I10 Essential (primary) hypertension I50.42 Chronic combined systolic (congestive) and diastolic (congestive) heart failure I87.2 Venous insufficiency (chronic) (peripheral) Follow-up Appointments Return  Appointment in 1 week. - Wed Return Appointment in: - Friday for initial cast change Dressing Change Frequency Wound #1 Left,Plantar Foot Do not change entire dressing for one week. Wound #2 Right Metatarsal head second Change Dressing every other day. Wound Cleansing Wound #1 Left,Plantar Foot May shower and wash wound with soap and water. Wound #2 Right Metatarsal head second May shower with protection. - use cast protector to  shower Primary Wound Dressing Wound #1 Left,Plantar Foot Calcium Alginate with Silver Wound #2 Right Metatarsal head second Calcium Alginate with Silver Secondary Dressing Wound #1 Left,Plantar Foot Foam Dry Gauze - secure with tape Drawtex - cut to fit inside wound margins Wound #2 Right Metatarsal head second Kerlix/Rolled Gauze Dry Gauze Drawtex - cut to fit inside wound margins Edema Control Patient to wear own compression stockings - daily Avoid standing for long periods of time Elevate legs to the level of the heart or above for 30 minutes daily and/or when sitting, a frequency of: Off-Loading Total Contact Cast to Left Lower Extremity - foam padding between left great and 2nd toe Open toe surgical shoe to: - right foot daily Electronic Signature(s) Signed: 11/07/2019 6:03:18 PM By: Zenaida DeedBoehlein, Linda RN, BSN Signed: 11/07/2019 7:03:16 PM By: Lenda KelpStone III, Talin Rozeboom PA-C Entered By: Zenaida DeedBoehlein, Linda on 11/07/2019 13:51:50 -------------------------------------------------------------------------------- Problem List Details Patient Name: Date of Service: [name], [name] 11/07/2019 12:30 PM Medical Record [id number] Patient Account Number: [id number] Date of Birth/Sex: Treating RN: 08/22/1961 (59 y.o. M) Primary Care Provider: Cheryll CockayneBurns, Stacy Other Clinician: Referring Provider: Treating Provider/Extender:Stone III, Dayton MartesHoyt Burns, Stacy Weeks in Treatment: 1 Active Problems ICD-10 Evaluated Encounter Code Description Active Date Today  Diagnosis E11.622 Type 2 diabetes mellitus with other skin ulcer 10/31/2019 No Yes L97.522 Non-pressure chronic ulcer of other part of left foot 10/31/2019 No Yes with fat layer exposed L97.512 Non-pressure chronic ulcer of other part of right foot 10/31/2019 No Yes with fat layer exposed I10 Essential (primary) hypertension 10/31/2019 No Yes I50.42 Chronic combined systolic (congestive) and diastolic 10/31/2019 No Yes (congestive) heart failure I87.2 Venous insufficiency (chronic) (peripheral) 10/31/2019 No Yes Inactive Problems Resolved Problems Electronic Signature(s) Signed: 11/07/2019 1:09:58 PM By: Lenda KelpStone III, Trenee Igoe PA-C Entered By: Lenda KelpStone III, Amila Callies on 11/07/2019 13:09:58 -------------------------------------------------------------------------------- Progress Note Details Patient Name: Date of Service: [name], [name] 11/07/2019 12:30 PM Medical Record [id number] Patient Account Number: [id number] Date of Birth/Sex: Treating RN: 05/07/1961 (59 y.o. M) Primary Care Provider: Cheryll CockayneBurns, Stacy Other Clinician: Referring Provider: Treating Provider/Extender:Stone III, Dayton MartesHoyt Burns, Stacy Weeks in Treatment: 1 Subjective Chief Complaint Information obtained from Patient Bilateral foot diabetic ulcers History of Present Illness (HPI) 10/31/2019 upon evaluation today patient appears to be doing somewhat poorly in regard to his bilateral plantar feet. He has wounds that he tells me have been present since 2012 intermittently off and on. Most recently this has been open for at least the past 6 months to a year. He has been trying to treat this in different ways using Santyl along with various other dressings including Medihoney and even at one point Xeroform. Nothing really has seem to get this completely closed. He was recently in the hospital for cellulitis of his leg subsequently he did have x-rays as well as MRIs that showed negative for any signs of osteomyelitis in regard to the wounds on  his feet. Fortunately there is no signs of systemic infection at this time. No fevers, chills, nausea, vomiting, or diarrhea. Patient has previously used Darco offloading shoes as far as frontal floaters as well as postop surgical shoes. He has never been in a total contact cast that may be something we need to strongly consider here. Patient's most recent hemoglobin A1c 1 month ago was 5.3 seems to be very well controlled which is great. Subsequently he has seen vascular as well as podiatry. His ABIs are 1.07 on the left and 1.14 on the right he seems to be doing well he does have chronic  venous stasis. 11/07/2019 upon evaluation today patient appears to be doing well with regard to his wounds all things considered. I do not see any severe worsening he still has some callus buildup on the right more than the left he notes that he has been probably more active than he should as far as walking is concerned is just very hard to not be active. He knows he needs to be more careful in this regard however. He is willing to give the cast a try at this point although he notes that he is a little nervous about this just with regard to balance although he will be very careful and obviously if he has any trouble he knows to contact the office and let me know. Objective Constitutional Well-nourished and well-hydrated in no acute distress. Vitals Time Taken: 1:03 PM, Height: 73 in, Source: Stated, Weight: 260 lbs, Source: Stated, BMI: 34.3, Temperature: 98.5 F, Pulse: 74 bpm, Respiratory Rate: 18 breaths/min, Blood Pressure: 145/81 mmHg. Respiratory normal breathing without difficulty. Psychiatric this patient is able to make decisions and demonstrates good insight into disease process. Alert and Oriented x 3. pleasant and cooperative. General Notes: Patient's wound bed actually showed signs of good granulation at this time. Fortunately there is no signs of active infection which is excellent as well he  did have some callus noted on the right plantar foot that did require sharp debridement this was just mainly debriding away callus no slough and no surface of the wound or subcutaneous tissue were debrided away today. Subsequently we then did proceed to apply the total contact cast of the left foot the right foot was the one which was debrided. Post to we did pad between the first and second toes of the left foot in order to prevent anything from pressing or rubbing causing an issue here because of how they overlap. Also I did leave some extra room as well when applying the cast so that hopefully this will not cause any pressure or rubbing which would not be good. Patient tolerated the application of the total contact cast without complication. Integumentary (Hair, Skin) Wound #1 status is Open. Original cause of wound was Gradually Appeared. The wound is located on the Left,Plantar Foot. The wound measures 3.6cm length x 3.7cm width x 0.6cm depth; 10.462cm^2 area and 6.277cm^3 volume. There is Fat Layer (Subcutaneous Tissue) Exposed exposed. There is no tunneling or undermining noted. There is a medium amount of serosanguineous drainage noted. The wound margin is flat and intact. There is medium (34-66%) pink granulation within the wound bed. There is a medium (34-66%) amount of necrotic tissue within the wound bed including Adherent Slough. Wound #2 status is Open. Original cause of wound was Gradually Appeared. The wound is located on the Right Metatarsal head second. The wound measures 2.2cm length x 1.5cm width x 0.9cm depth; 2.592cm^2 area and 2.333cm^3 volume. There is Fat Layer (Subcutaneous Tissue) Exposed exposed. There is no tunneling noted, however, there is undermining starting at 10:00 and ending at 1:00 with a maximum distance of 0.4cm. There is a medium amount of serosanguineous drainage noted. The wound margin is flat and intact. There is medium (34-66%) pink granulation within  the wound bed. There is a medium (34-66%) amount of necrotic tissue within the wound bed including Adherent Slough. Assessment Active Problems ICD-10 Type 2 diabetes mellitus with other skin ulcer Non-pressure chronic ulcer of other part of left foot with fat layer exposed Non-pressure chronic ulcer of other part of right  foot with fat layer exposed Essential (primary) hypertension Chronic combined systolic (congestive) and diastolic (congestive) heart failure Venous insufficiency (chronic) (peripheral) Procedures Wound #2 Pre-procedure diagnosis of Wound #2 is a Diabetic Wound/Ulcer of the Lower Extremity located on the Right Metatarsal head second .Severity of Tissue Pre Debridement is: Fat layer exposed. There was a Selective/Open Wound Skin/Epidermis Debridement with a total area of 4 sq cm performed by Worthy Keeler, PA. With the following instrument(s): Curette to remove Non-Viable tissue/material. Material removed includes Callus and Skin: Epidermis and after achieving pain control using Lidocaine 5% topical ointment. No specimens were taken. A time out was conducted at 13:48, prior to the start of the procedure. A Minimum amount of bleeding was controlled with Pressure. The procedure was tolerated well with a pain level of 0 throughout and a pain level of 0 following the procedure. Post Debridement Measurements: 2.5cm length x 1.6cm width x 0.9cm depth; 2.827cm^3 volume. Character of Wound/Ulcer Post Debridement is improved. Severity of Tissue Post Debridement is: Fat layer exposed. Post procedure Diagnosis Wound #2: Same as Pre-Procedure Wound #1 Pre-procedure diagnosis of Wound #1 is a Diabetic Wound/Ulcer of the Lower Extremity located on the Left,Plantar Foot . There was a Total Contact Cast Procedure by Worthy Keeler, PA. Post procedure Diagnosis Wound #1: Same as Pre-Procedure Plan Follow-up Appointments: Return Appointment in 1 week. - Wed Return Appointment in: -  Friday for initial cast change Dressing Change Frequency: Wound #1 Left,Plantar Foot: Do not change entire dressing for one week. Wound #2 Right Metatarsal head second: Change Dressing every other day. Wound Cleansing: Wound #1 Left,Plantar Foot: May shower and wash wound with soap and water. Wound #2 Right Metatarsal head second: May shower with protection. - use cast protector to shower Primary Wound Dressing: Wound #1 Left,Plantar Foot: Calcium Alginate with Silver Wound #2 Right Metatarsal head second: Calcium Alginate with Silver Secondary Dressing: Wound #1 Left,Plantar Foot: Foam Dry Gauze - secure with tape Drawtex - cut to fit inside wound margins Wound #2 Right Metatarsal head second: Kerlix/Rolled Gauze Dry Gauze Drawtex - cut to fit inside wound margins Edema Control: Patient to wear own compression stockings - daily Avoid standing for long periods of time Elevate legs to the level of the heart or above for 30 minutes daily and/or when sitting, a frequency of: Off-Loading: Total Contact Cast to Left Lower Extremity - foam padding between left great and 2nd toe Open toe surgical shoe to: - right foot daily 1. My suggestion at this time is good to be that we go ahead and initiate the above wound care measures for the next week which includes continuing with the silver alginate to both wound locations. We will continue with using the bedroom shoes on his right we are going to use a total contact cast on the left. This was applied today by myself. 2. I am also going to suggest that we go ahead and continue with aggressive offloading which is also get a mean that since he has a wound on the right foot as well as continue to take it easy not ambulating too much and avoiding any walking that does not necessarily have to be undertaken. The patient voiced understanding. We will see patient back for reevaluation in 1 week here in the clinic. If anything worsens or changes  patient will contact our office for additional recommendations. Electronic Signature(s) Signed: 11/07/2019 1:57:19 PM By: Worthy Keeler PA-C Entered By: Worthy Keeler on 11/07/2019 13:57:19 -------------------------------------------------------------------------------- Total  Contact Cast Details Patient Name: Date of Service: [name], [name] 11/07/2019 12:30 PM Medical Record [id number] Patient Account Number: [id number] Date of Birth/Sex: 10/13/1961 (59 y.o. M) Treating RN: Zenaida Deed Primary Care Provider: [name] Other Clinician: Referring Provider: Treating Provider/Extender:Stone III, Dayton Martes, Stacy Weeks in Treatment: 1 Total Contact Cast Applied for Wound Assessment: Wound #1 Left,Plantar Foot Performed By: Physician Lenda Kelp, PA Post Procedure Diagnosis Same as Pre-procedure Electronic Signature(s) Signed: 11/07/2019 6:03:18 PM By: Zenaida Deed RN, BSN Signed: 11/07/2019 7:03:16 PM By: Lenda Kelp PA-C Entered By: Zenaida Deed on 11/07/2019 13:52:18 -------------------------------------------------------------------------------- SuperBill Details Patient Name: Date of Service: [name], [name] 11/07/2019 Medical Record [id number] Patient Account Number: [id number] Date of Birth/Sex: Treating RN: 1960-12-05 (59 y.o. [name] Primary Care Provider: [name] Other Clinician: Referring Provider: Treating Provider/Extender:Stone III, Dayton Martes, Stacy Weeks in Treatment: 1 Diagnosis Coding ICD-10 Codes Code Description E11.622 Type 2 diabetes mellitus with other skin ulcer L97.522 Non-pressure chronic ulcer of other part of left foot with fat layer exposed L97.512 Non-pressure chronic ulcer of other part of right foot with fat layer exposed I10 Essential (primary) hypertension I50.42 Chronic combined systolic (congestive) and diastolic (congestive) heart failure I87.2 Venous insufficiency (chronic) (peripheral) Facility  Procedures CPT4 Code Description: [id number] 97597 - DEBRIDE WOUND 1ST 20 SQ CM OR < ICD-10 Diagnosis Description L97.512 Non-pressure chronic ulcer of other part of right foot with Modifier: fat layer ex Quantity: 1 posed CPT4 Code Description: [id number] - APPLY TOTAL CONTACT LEG CAST ICD-10 Diagnosis Description L97.522 Non-pressure chronic ulcer of other part of left foot with Modifier: 59 fat layer exp Quantity: 1 osed Physician Procedures CPT4 Code Description: [id number] 97597 - WC PHYS DEBR WO ANESTH 20 SQ CM ICD-10 Diagnosis Description L97.512 Non-pressure chronic ulcer of other part of right foot with Modifier: fat layer ex Quantity: 1 posed CPT4 Code Description: [id number] - WC PHYS APPLY TOTAL CONTACT CAST ICD-10 Diagnosis Description L97.522 Non-pressure chronic ulcer of other part of left foot with Modifier: 59 fat layer exp Quantity: 1 osed Electronic Signature(s) Signed: 11/07/2019 1:57:41 PM By: Lenda Kelp PA-C Entered By: Lenda Kelp on 11/07/2019 13:57:41

## 2019-11-09 ENCOUNTER — Encounter (HOSPITAL_BASED_OUTPATIENT_CLINIC_OR_DEPARTMENT_OTHER): Payer: Medicare (Managed Care) | Admitting: Internal Medicine

## 2019-11-09 ENCOUNTER — Other Ambulatory Visit: Payer: Self-pay

## 2019-11-09 DIAGNOSIS — E11621 Type 2 diabetes mellitus with foot ulcer: Secondary | ICD-10-CM | POA: Diagnosis not present

## 2019-11-09 NOTE — Progress Notes (Signed)
[name], [name] ([id number]) Visit Report for 11/09/2019 HPI Details Patient Name: Date of Service: [name], [name] 11/09/2019 12:30 PM Medical Record [id number] Patient Account Number: [id number] Date of Birth/Sex: Treating RN: 08-23-1961 (59 y.o. M) Primary Care Provider: [name] Other Clinician: Referring Provider: Treating Provider/Extender:Shemika Robbs, Zebedee Iba, Duanne Limerick in Treatment: 1 History of Present Illness HPI Description: 10/31/2019 upon evaluation today patient appears to be doing somewhat poorly in regard to his bilateral plantar feet. He has wounds that he tells me have been present since 2012 intermittently off and on. Most recently this has been open for at least the past 6 months to a year. He has been trying to treat this in different ways using Santyl along with various other dressings including Medihoney and even at one point Xeroform. Nothing really has seem to get this completely closed. He was recently in the hospital for cellulitis of his leg subsequently he did have x-rays as well as MRIs that showed negative for any signs of osteomyelitis in regard to the wounds on his feet. Fortunately there is no signs of systemic infection at this time. No fevers, chills, nausea, vomiting, or diarrhea. Patient has previously used Darco offloading shoes as far as frontal floaters as well as postop surgical shoes. He has never been in a total contact cast that may be something we need to strongly consider here. Patient's most recent hemoglobin A1c 1 month ago was 5.3 seems to be very well controlled which is great. Subsequently he has seen vascular as well as podiatry. His ABIs are 1.07 on the left and 1.14 on the right he seems to be doing well he does have chronic venous stasis. 11/07/2019 upon evaluation today patient appears to be doing well with regard to his wounds all things considered. I do not see any severe worsening he still has some callus buildup on the right  more than the left he notes that he has been probably more active than he should as far as walking is concerned is just very hard to not be active. He knows he needs to be more careful in this regard however. He is willing to give the cast a try at this point although he notes that he is a little nervous about this just with regard to balance although he will be very careful and obviously if he has any trouble he knows to contact the office and let me know. 1/22; patient is in for his obligatory first total contact cast change. Our intake nurse reported a very large amount of drainage which is spelled out over to the surrounding skin. Has bilateral diabetic foot wounds. He has Charcot feet. We have been using silver alginate on his wounds. Electronic Signature(s) Signed: 11/09/2019 5:37:40 PM By: Baltazar Najjar MD Entered By: Baltazar Najjar on 11/09/2019 14:01:46 -------------------------------------------------------------------------------- Physical Exam Details Patient Name: Date of Service: [name], [name] 11/09/2019 12:30 PM Medical Record [id number] Patient Account Number: [id number] Date of Birth/Sex: Treating RN: 01-19-1961 (59 y.o. M) Primary Care Provider: [name] Other Clinician: Referring Provider: Treating Provider/Extender:Arion Morgan, Zebedee Iba, Duanne Limerick in Treatment: 1 Constitutional Patient is hypertensive.. Pulse regular and within target range for patient.Marland Kitchen Respirations regular, non-labored and within target range.. Temperature is normal and within the target range for the patient.Marland Kitchen Appears in no distress. Notes Wound exam; the patient has decent looking granulation bilaterally. On the left where the total contact cast was some beginnings of callus around the edges also on the right but nothing really looks ominous here. Certainly no  evidence of infection. There is no palpable bone. After some consideration I elected to replace the total contact cast which  was done in the standard fashion. All efforts were made to except the drainage on the left. Electronic Signature(s) Signed: 11/09/2019 5:37:40 PM By: Baltazar Najjar MD Entered By: Baltazar Najjar on 11/09/2019 14:03:08 -------------------------------------------------------------------------------- Physician Orders Details Patient Name: Date of Service: [name], [name] 11/09/2019 12:30 PM Medical Record [id number] Patient Account Number: [id number] Date of Birth/Sex: Treating RN: 25-Dec-1960 (59 y.o. Katherina Right Primary Care Provider: [name] Other Clinician: Referring Provider: Treating Provider/Extender:Hal Norrington, Zebedee Iba, Duanne Limerick in Treatment: 1 Verbal / Phone Orders: No Diagnosis Coding ICD-10 Coding Code Description E11.622 Type 2 diabetes mellitus with other skin ulcer L97.522 Non-pressure chronic ulcer of other part of left foot with fat layer exposed L97.512 Non-pressure chronic ulcer of other part of right foot with fat layer exposed I10 Essential (primary) hypertension I50.42 Chronic combined systolic (congestive) and diastolic (congestive) heart failure I87.2 Venous insufficiency (chronic) (peripheral) Follow-up Appointments Return Appointment in 1 week. - Wed Return Appointment in: - Friday for initial cast change Dressing Change Frequency Wound #1 Left,Plantar Foot Do not change entire dressing for one week. Wound #2 Right Metatarsal head second Change Dressing every other day. Wound Cleansing Wound #1 Left,Plantar Foot May shower with protection. - cast protector Wound #2 Right Metatarsal head second May shower and wash wound with soap and water. Primary Wound Dressing Wound #1 Left,Plantar Foot Calcium Alginate with Silver - cut to fit wound bed Wound #2 Right Metatarsal head second Calcium Alginate with Silver Secondary Dressing Wound #1 Left,Plantar Foot ABD pad Drawtex - cut to fit inside wound margins Wound #2 Right Metatarsal  head second Kerlix/Rolled Gauze Dry Gauze Drawtex - cut to fit inside wound margins Edema Control Patient to wear own compression stockings - daily Avoid standing for long periods of time Elevate legs to the level of the heart or above for 30 minutes daily and/or when sitting, a frequency of: Off-Loading Total Contact Cast to Left Lower Extremity - foam padding between left great and 2nd toe Open toe surgical shoe to: - right foot daily Electronic Signature(s) Signed: 11/09/2019 5:37:40 PM By: Baltazar Najjar MD Signed: 11/09/2019 5:39:40 PM By: Cherylin Mylar Entered By: Cherylin Mylar on 11/09/2019 13:20:46 -------------------------------------------------------------------------------- Problem List Details Patient Name: Date of Service: [name], [name] 11/09/2019 12:30 PM Medical Record [id number] Patient Account Number: [id number] Date of Birth/Sex: Treating RN: 1961-03-27 (59 y.o. Katherina Right Primary Care Provider: [name] Other Clinician: Referring Provider: Treating Provider/Extender:Caeleb Batalla, Zebedee Iba, Duanne Limerick in Treatment: 1 Active Problems ICD-10 Evaluated Encounter Code Description Active Date Today Diagnosis E11.622 Type 2 diabetes mellitus with other skin ulcer 10/31/2019 No Yes L97.522 Non-pressure chronic ulcer of other part of left foot 10/31/2019 No Yes with fat layer exposed L97.512 Non-pressure chronic ulcer of other part of right foot 10/31/2019 No Yes with fat layer exposed I10 Essential (primary) hypertension 10/31/2019 No Yes I50.42 Chronic combined systolic (congestive) and diastolic 10/31/2019 No Yes (congestive) heart failure I87.2 Venous insufficiency (chronic) (peripheral) 10/31/2019 No Yes Inactive Problems Resolved Problems Electronic Signature(s) Signed: 11/09/2019 5:37:40 PM By: Baltazar Najjar MD Entered By: Baltazar Najjar on 11/09/2019  14:00:21 -------------------------------------------------------------------------------- Progress Note Details Patient Name: Date of Service: [name], [name] 11/09/2019 12:30 PM Medical Record [id number] Patient Account Number: [id number] Date of Birth/Sex: Treating RN: May 08, 1961 (59 y.o. M) Primary Care Provider: [name] Other Clinician: Referring Provider: Treating Provider/Extender:Krystan Northrop, Zebedee Iba, Duanne Limerick in Treatment: 1 Subjective History  of Present Illness (HPI) 10/31/2019 upon evaluation today patient appears to be doing somewhat poorly in regard to his bilateral plantar feet. He has wounds that he tells me have been present since 2012 intermittently off and on. Most recently this has been open for at least the past 6 months to a year. He has been trying to treat this in different ways using Santyl along with various other dressings including Medihoney and even at one point Xeroform. Nothing really has seem to get this completely closed. He was recently in the hospital for cellulitis of his leg subsequently he did have x-rays as well as MRIs that showed negative for any signs of osteomyelitis in regard to the wounds on his feet. Fortunately there is no signs of systemic infection at this time. No fevers, chills, nausea, vomiting, or diarrhea. Patient has previously used Darco offloading shoes as far as frontal floaters as well as postop surgical shoes. He has never been in a total contact cast that may be something we need to strongly consider here. Patient's most recent hemoglobin A1c 1 month ago was 5.3 seems to be very well controlled which is great. Subsequently he has seen vascular as well as podiatry. His ABIs are 1.07 on the left and 1.14 on the right he seems to be doing well he does have chronic venous stasis. 11/07/2019 upon evaluation today patient appears to be doing well with regard to his wounds all things considered. I do not see any severe  worsening he still has some callus buildup on the right more than the left he notes that he has been probably more active than he should as far as walking is concerned is just very hard to not be active. He knows he needs to be more careful in this regard however. He is willing to give the cast a try at this point although he notes that he is a little nervous about this just with regard to balance although he will be very careful and obviously if he has any trouble he knows to contact the office and let me know. 1/22; patient is in for his obligatory first total contact cast change. Our intake nurse reported a very large amount of drainage which is spelled out over to the surrounding skin. Has bilateral diabetic foot wounds. He has Charcot feet. We have been using silver alginate on his wounds. Objective Constitutional Patient is hypertensive.. Pulse regular and within target range for patient.Marland Kitchen Respirations regular, non-labored and within target range.. Temperature is normal and within the target range for the patient.Marland Kitchen Appears in no distress. Vitals Time Taken: 12:53 PM, Height: 73 in, Weight: 260 lbs, BMI: 34.3, Temperature: 98.2 F, Pulse: 78 bpm, Respiratory Rate: 18 breaths/min, Blood Pressure: 145/73 mmHg. General Notes: Wound exam; the patient has decent looking granulation bilaterally. On the left where the total contact cast was some beginnings of callus around the edges also on the right but nothing really looks ominous here. Certainly no evidence of infection. There is no palpable bone. After some consideration I elected to replace the total contact cast which was done in the standard fashion. All efforts were made to except the drainage on the left. Integumentary (Hair, Skin) Wound #1 status is Open. Original cause of wound was Gradually Appeared. The wound is located on the [place]. The wound measures 3.5cm length x 4.1cm width x 0.4cm depth; 11.27cm^2 area and  4.508cm^3 volume. There is Fat Layer (Subcutaneous Tissue) Exposed exposed. There is no tunneling or undermining  noted. There is a large amount of serosanguineous drainage noted. The wound margin is flat and intact. There is medium (34-66%) pink granulation within the wound bed. There is a medium (34-66%) amount of necrotic tissue within the wound bed including Adherent Slough. General Notes: maceration to periwound Wound #2 status is Open. Original cause of wound was Gradually Appeared. The wound is located on the Right Metatarsal head second. The wound measures 2cm length x 2.5cm width x 1cm depth; 3.927cm^2 area and 3.927cm^3 volume. There is Fat Layer (Subcutaneous Tissue) Exposed exposed. There is no tunneling or undermining noted. There is a medium amount of serosanguineous drainage noted. The wound margin is flat and intact. There is medium (34-66%) pink granulation within the wound bed. There is a medium (34-66%) amount of necrotic tissue within the wound bed including Adherent Slough. Assessment Active Problems ICD-10 Type 2 diabetes mellitus with other skin ulcer Non-pressure chronic ulcer of other part of left foot with fat layer exposed Non-pressure chronic ulcer of other part of right foot with fat layer exposed Essential (primary) hypertension Chronic combined systolic (congestive) and diastolic (congestive) heart failure Venous insufficiency (chronic) (peripheral) Procedures Wound #1 Pre-procedure diagnosis of Wound #1 is a Diabetic Wound/Ulcer of the Lower Extremity located on the Left,Plantar Foot . There was a Total Contact Cast Procedure by Maxwell Caulobson, Dshawn Mcnay G., MD. Post procedure Diagnosis Wound #1: Same as Pre-Procedure Plan Follow-up Appointments: Return Appointment in 1 week. - Wed Return Appointment in: - Friday for initial cast change Dressing Change Frequency: Wound #1 Left,Plantar Foot: Do not change entire dressing for one week. Wound #2 Right Metatarsal  head second: Change Dressing every other day. Wound Cleansing: Wound #1 Left,Plantar Foot: May shower with protection. - cast protector Wound #2 Right Metatarsal head second: May shower and wash wound with soap and water. Primary Wound Dressing: Wound #1 Left,Plantar Foot: Calcium Alginate with Silver - cut to fit wound bed Wound #2 Right Metatarsal head second: Calcium Alginate with Silver Secondary Dressing: Wound #1 Left,Plantar Foot: ABD pad Drawtex - cut to fit inside wound margins Wound #2 Right Metatarsal head second: Kerlix/Rolled Gauze Dry Gauze Drawtex - cut to fit inside wound margins Edema Control: Patient to wear own compression stockings - daily Avoid standing for long periods of time Elevate legs to the level of the heart or above for 30 minutes daily and/or when sitting, a frequency of: Off-Loading: Total Contact Cast to Left Lower Extremity - foam padding between left great and 2nd toe Open toe surgical shoe to: - right foot daily 1. On the left we continued with silver alginate with drawtex cut out to form in the wound bed layer by drawtex and an ABD 2. To make an incision to put the total contact cast back on I was comforted somewhat by the fact that he will be back on Wednesday next week less than the standard week. This should give us an opportunity to make the distinction about whether the drainage is going to be excessive to remain on all week. In that event we could consider a wound VAC under the total contact cast. 3. Otherwise his wounds look fairly good Electronic Signature(s) Signed: 11/09/2019 5:37:40 PM By: Baltazar Najjarobson, Giara Mcgaughey MD Entered By: Baltazar Najjarobson, Tarita Deshmukh on 11/09/2019 14:04:29 -------------------------------------------------------------------------------- Total Contact Cast Details Patient Name: Date of Service: [name], [name] 11/09/2019 12:30 PM Medical Record [id number] Patient Account Number: [id number] Date of Birth/Sex: 11/09/1960 (59 y.o.  M) Treating RN: Primary Care Provider: Cheryll CockayneBurns, Stacy Other Clinician: Referring Provider: Treating  Provider/Extender:Asherah Lavoy, Zebedee Iba, Stacy Weeks in Treatment: 1 Total Contact Cast Applied for Wound Assessment: Wound #1 Left,Plantar Foot Performed By: Physician Maxwell Caul., MD Post Procedure Diagnosis Same as Pre-procedure Electronic Signature(s) Signed: 11/09/2019 5:37:40 PM By: Baltazar Najjar MD Entered By: Baltazar Najjar on 11/09/2019 14:05:05 -------------------------------------------------------------------------------- SuperBill Details Patient Name: Date of Service: [name], [name] 11/09/2019 Medical Record [id number] Patient Account Number: [id number] Date of Birth/Sex: Treating RN: 11/05/1960 (59 y.o. M) Primary Care Provider: [name] Other Clinician: Referring Provider: Treating Provider/Extender:Davied Nocito, Zebedee Iba, Duanne Limerick in Treatment: 1 Diagnosis Coding ICD-10 Codes Code Description E11.622 Type 2 diabetes mellitus with other skin ulcer L97.522 Non-pressure chronic ulcer of other part of left foot with fat layer exposed L97.512 Non-pressure chronic ulcer of other part of right foot with fat layer exposed I10 Essential (primary) hypertension I50.42 Chronic combined systolic (congestive) and diastolic (congestive) heart failure I87.2 Venous insufficiency (chronic) (peripheral) Facility Procedures CPT4 Code Description: [id number] - APPLY TOTAL CONTACT LEG CAST ICD-10 Diagnosis Description L97.522 Non-pressure chronic ulcer of other part of left foot with Modifier: fat layer exp Quantity: 1 osed Physician Procedures CPT4 Code Description: [id number] - WC PHYS APPLY TOTAL CONTACT CAST ICD-10 Diagnosis Description L97.522 Non-pressure chronic ulcer of other part of left foot with Modifier: fat layer exp Quantity: 1 osed Electronic Signature(s) Signed: 11/09/2019 5:37:40 PM By: Baltazar Najjar MD Entered By: Baltazar Najjar on  11/09/2019 14:04:51

## 2019-11-12 NOTE — Progress Notes (Signed)
[name], [name] ([id number]) Visit Report for 11/09/2019 Arrival Information Details Patient Name: Date of Service: [name], [name] 11/09/2019 12:30 PM Medical Record [id number] Patient Account Number: [id number] Date of Birth/Sex: Treating RN: September 03, 1961 (59 y.o. Judie Petit) Yevonne Pax Primary Care Chelisa Hennen: [name] Other Clinician: Referring Elvy Mclarty: Treating Tenleigh Byer/Extender:Robson, Zebedee Iba, Duanne Limerick in Treatment: 1 Visit Information History Since Last Visit All ordered tests and consults were No Patient Arrived: Cane completed: Arrival Time: 12:50 Added or deleted any medications: No Accompanied By: self Any new allergies or adverse reactions: No Transfer Assistance: None Had a fall or experienced change in No Patient Identification Verified: Yes activities of daily living that may affect Secondary Verification Process Yes risk of falls: Completed: Signs or symptoms of abuse/neglect since No Patient Requires Transmission-Based No last visito Precautions: Hospitalized since last visit: No Patient Has Alerts: Yes Implantable device outside of the clinic No Patient Alerts: L ABI 1.07 excluding (04/2019) cellular tissue based products placed in R ABI 1.14 the center (04/2019) since last visit: Has Dressing in Place as Prescribed: Yes Has Footwear/Offloading in Place as Yes Prescribed: Right: Total Contact Cast Pain Present Now: No Electronic Signature(s) Signed: 11/12/2019 6:01:30 PM By: Yevonne Pax RN Entered By: Yevonne Pax on 11/09/2019 12:53:44 -------------------------------------------------------------------------------- Encounter Discharge Information Details Patient Name: Date of Service: [name], [name] 11/09/2019 12:30 PM Medical Record [id number] Patient Account Number: [id number] Date of Birth/Sex: Treating RN: 1961-09-11 (59 y.o. [name] Primary Care Marshea Wisher: [name] Other Clinician: Referring Helaman Mecca: Treating  Lorene Samaan/Extender:Robson, Zebedee Iba, Duanne Limerick in Treatment: 1 Encounter Discharge Information Items Discharge Condition: Stable Ambulatory Status: Cane Discharge Destination: Home Transportation: Private Auto Accompanied By: self Schedule Follow-up Appointment: Yes Clinical Summary of Care: Electronic Signature(s) Signed: 11/09/2019 5:44:09 PM By: Shawn Stall Entered By: Shawn Stall on 11/09/2019 14:03:06 -------------------------------------------------------------------------------- Lower Extremity Assessment Details Patient Name: Date of Service: [name], [name] 11/09/2019 12:30 PM Medical Record [id number] Patient Account Number: [id number] Date of Birth/Sex: Treating RN: Apr 24, 1961 (59 y.o. [name] Primary Care Vearl Aitken: [name] Other Clinician: Referring Virlee Stroschein: Treating Cayetano Mikita/Extender:Robson, Zebedee Iba, Stacy Weeks in Treatment: 1 Edema Assessment Assessed: [Left: No] [Right: No] Edema: [Left: Yes] [Right: Yes] Calf Left: Right: Point of Measurement: 30 cm From Medial Instep 45 cm 42 cm Ankle Left: Right: Point of Measurement: 11 cm From Medial Instep 28.7 cm 27.5 cm Electronic Signature(s) Signed: 11/12/2019 6:01:30 PM By: Yevonne Pax RN Entered By: Yevonne Pax on 11/09/2019 13:05:48 -------------------------------------------------------------------------------- Multi Wound Chart Details Patient Name: Date of Service: [name], [name] 11/09/2019 12:30 PM Medical Record [id number] Patient Account Number: [id number] Date of Birth/Sex: Treating RN: 05/25/1961 (59 y.o. 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 Lower Extremity] [2:Lower Extremity] Comorbid History: [1:Anemia, Sleep Apnea, Congestive Heart Failure, Congestive Heart Failure, Hypertension, Peripheral Hypertension, Peripheral Venous Disease, Cirrhosis , Venous Disease, Cirrhosis , Type II Diabetes, Osteoarthritis, Neuropathy  Osteoarthritis, Neuropathy] [2:Anemia, Sleep Apnea, Type II Diabetes,] [N/A:N/A] Date Acquired: [1:02/16/2019] [2:02/16/2019] [N/A:N/A] Weeks of Treatment: [1:1] [2:1] [N/A:N/A] Wound Status: [1:Open] [2:Open] [N/A:N/A] Measurements L x W x D 3.5x4.1x0.4 [2:2x2.5x1] [N/A:N/A] (cm) Area (cm) : [1:11.27] [2:3.927] [N/A:N/A] Volume (cm) : [  1:4.508] [2:3.927] [N/A:N/A] % Reduction in Area: [1:-25.90%] [2:-78.60%] [N/A:N/A] % Reduction in Volume: 28.10% [2:-48.80%] [N/A:N/A] Classification: [1:Grade 2] [2:Grade 2] [N/A:N/A] Exudate Amount: [1:Large] [2:Medium] [N/A:N/A] Exudate Type: [1:Serosanguineous] [2:Serosanguineous] [N/A:N/A] Exudate Color: [1:red, brown] [2:red, brown] [N/A:N/A] Wound Margin: [1:Flat and Intact] [2:Flat and Intact] [N/A:N/A] Granulation Amount: [1:Medium (34-66%)] [2:Medium (34-66%)] [N/A:N/A] Granulation Quality: [1:Pink] [2:Pink] [N/A:N/A] Necrotic Amount: [1:Medium (34-66%)] [2:Medium (34-66%)] [N/A:N/A] Exposed Structures: [1:Fat Layer (Subcutaneous Fat Layer (Subcutaneous N/A Tissue) Exposed: Yes Fascia: No Tendon: No Muscle: No Joint: No Bone: No] [2:Tissue) Exposed: Yes Fascia: No Tendon: No Muscle: No Joint: No Bone: No] Epithelialization: [1:None] [2:Small (1-33%)] [N/A:N/A] Assessment Notes: [1:maceration to periwound N/A] [2:N/A] [N/A:N/A N/A] Treatment Notes Electronic Signature(s) Signed: 11/09/2019 5:37:40 PM By: Linton Ham  MD Entered By: Linton Ham on 11/09/2019 14:00:39 -------------------------------------------------------------------------------- Multi-Disciplinary Care Plan Details Patient Name: Date of Service: FERRIS, FIELDEN 11/09/2019 12:30 PM Medical Record [id number] Patient Account Number: [id number] Date of Birth/Sex: Treating RN: 02-12-1961 (59 y.o. [name] Primary Care Tegh Franek: Billey Gosling Other Clinician: Referring Jamarco Zaldivar: Treating Kaitland Lewellyn/Extender:Robson, Baird Lyons, Nadine Counts in Treatment: 1 Active Inactive Nutrition Nursing Diagnoses: Potential for alteratiion in Nutrition/Potential for imbalanced nutrition Goals: Patient/caregiver will maintain therapeutic glucose control Date Initiated: 10/31/2019 Target Resolution Date: 11/28/2019 Goal Status: Active Interventions: Assess HgA1c results as ordered upon admission and as needed Provide education on elevated blood sugars and impact on wound healing Treatment Activities: Patient referred to Primary Care Physician for further nutritional evaluation : 10/31/2019 Notes: Peripheral Neuropathy Nursing Diagnoses: Knowledge deficit related to disease process and management of peripheral neurovascular dysfunction Goals: Patient/caregiver will verbalize understanding of disease process and disease management Date Initiated: 10/31/2019 Target Resolution Date: 11/28/2019 Goal Status: Active Interventions: Assess signs and symptoms of neuropathy upon admission and as needed Provide education on Management of Neuropathy and Related Ulcers Treatment Activities: Patient referred for customized footwear/offloading : 10/31/2019 Notes: Wound/Skin Impairment Nursing Diagnoses: Impaired tissue integrity Knowledge deficit related to ulceration/compromised skin integrity Goals: Patient/caregiver will verbalize understanding of skin care regimen Date Initiated: 10/31/2019 Target Resolution Date: 12/26/2019 Goal  Status: Active Ulcer/skin breakdown will have a volume reduction of 30% by week 4 Date Initiated: 10/31/2019 Target Resolution Date: 11/28/2019 Goal Status: Active Interventions: Assess patient/caregiver ability to obtain necessary supplies Assess patient/caregiver ability to perform ulcer/skin care regimen upon admission and as needed Assess ulceration(s) every visit Provide education on ulcer and skin care Treatment Activities: Skin care regimen initiated : 10/31/2019 Topical wound management initiated : 10/31/2019 Notes: Electronic Signature(s) Signed: 11/09/2019 5:39:40 PM By: Kela Millin Entered By: Kela Millin on 11/09/2019 12:57:41 -------------------------------------------------------------------------------- Pain Assessment Details Patient Name: Date of Service: WILTON, THRALL 11/09/2019 12:30 PM Medical Record [id number] Patient Account Number: [id number] Date of Birth/Sex: Treating RN: 1960-11-06 (58 y.o. [name] Primary Care Jett Kulzer: Billey Gosling Other Clinician: Referring Giavanni Zeitlin: Treating Alixandria Friedt/Extender:Robson, Baird Lyons, Nadine Counts in Treatment: 1 Active Problems Location of Pain Severity and Description of Pain Patient Has Paino No Site Locations Pain Management and Medication Current Pain Management: Electronic Signature(s) Signed: 11/12/2019 6:01:30 PM By: Carlene Coria RN Entered By: Carlene Coria on 11/09/2019 12:55:13 -------------------------------------------------------------------------------- Patient/Caregiver Education Details Patient Name: Date of Service: ALEXI, GEIBEL 1/22/2021andnbsp12:30 PM Medical Record [id number] Patient Account Number: [id number] Date of Birth/Gender: Jun 10, 1961 (58 y.o. M) Treating RN: Kela Millin Primary Care Physician: Billey Gosling Other Clinician: Referring Physician: Treating Physician/Extender:Robson, Baird Lyons, Nadine Counts in Treatment: 1 Education  Assessment Education Provided To: Patient Education Topics Provided Elevated Blood Sugar/ Impact on Healing: Methods: Explain/Verbal Responses:  State content correctly Peripheral Neuropathy: Methods: Explain/Verbal Responses: State content correctly Wound/Skin Impairment: Methods: Explain/Verbal Responses: State content correctly Electronic Signature(s) Signed: 11/09/2019 5:39:40 PM By: Cherylin Mylar Entered By: Cherylin Mylar on 11/09/2019 12:58:02 -------------------------------------------------------------------------------- Wound Assessment Details Patient Name: Date of Service: JAYLUN, FLEENER 11/09/2019 12:30 PM Medical Record [id number] Patient Account Number: [id number] Date of Birth/Sex: Treating RN: July 11, 1961 (59 y.o. M) Primary Care Marsi Turvey: [name] Other Clinician: Referring Samah Lapiana: Treating Myiah Petkus/Extender:Robson, Zebedee Iba, Duanne Limerick in Treatment: 1 Wound Status Wound Number: 1 Primary Diabetic Wound/Ulcer of the Lower Extremity Etiology: Wound Location: Left Foot - Plantar Wound Open Wounding Event: Gradually Appeared Status: Date Acquired: 02/16/2019 Comorbid Anemia, Sleep Apnea, Congestive Heart Weeks Of Treatment: 1 History: Failure, Hypertension, Peripheral Venous Clustered Wound: No Disease, Cirrhosis , Type II Diabetes, Osteoarthritis, Neuropathy Photos Wound Measurements Length: (cm) 3.5 Width: (cm) 4.1 Depth: (cm) 0.4 Area: (cm) 11.27 Volume: (cm) 4.508 Wound Description Classification: Grade 2 Wound Margin: Flat and Intact Exudate Amount: Large Exudate Type: Serosanguineous Exudate Color: red, brown Wound Bed Granulation Amount: Medium (34-66%) Granulation Quality: Pink Necrotic Amount: Medium (34-66%) Necrotic Quality: Adherent Slough fter Cleansing: No ino Yes Exposed Structure sed: No Subcutaneous Tissue) Exposed: Yes sed: No sed: No Exposed: No xposed: No % Reduction in Area: -25.9% %  Reduction in Volume: 28.1% Epithelialization: None Tunneling: No Undermining: No Foul Odor A Slough/Fibr Fascia Expo Fat Layer ( Tendon Expo Muscle Expo Joint Bone E Assessment Notes maceration to periwound Treatment Notes Wound #1 (Left, Plantar Foot) 1. Cleanse With Wound Cleanser Soap and water 3. Primary Dressing Applied Calcium Alginate Ag 4. Secondary Dressing ABD Pad Drawtex 5. Secured With Medipore tape 7. Footwear/Offloading device applied Total Contact Cast Notes foam around great toe and second toe. TCC applied by MD. Electronic Signature(s) Signed: 11/12/2019 4:40:42 PM By: Benjaman Kindler EMT/HBOT Entered By: Benjaman Kindler on 11/12/2019 14:52:18 -------------------------------------------------------------------------------- Wound Assessment Details Patient Name: Date of Service: MANSOUR, BALBOA 11/09/2019 12:30 PM Medical Record [id number] Patient Account Number: [id number] Date of Birth/Sex: Treating RN: 16-Mar-1961 (59 y.o. M) Primary Care Adrienna Karis: [name] Other Clinician: Referring Auburn Hester: Treating Zanasia Hickson/Extender:Robson, Zebedee Iba, Duanne Limerick in Treatment: 1 Wound Status Wound Number: 2 Primary Diabetic Wound/Ulcer of the Lower Extremity Etiology: Wound Location: Right Metatarsal head second Wound Open Wounding Event: Gradually Appeared Status: Date Acquired: 02/16/2019 Comorbid Anemia, Sleep Apnea, Congestive Heart Weeks Of Treatment: 1 History: Failure, Hypertension, Peripheral Venous Clustered Wound: No Disease, Cirrhosis , Type II Diabetes, Osteoarthritis, Neuropathy Photos Wound Measurements Length: (cm) 2 Width: (cm) 2.5 Depth: (cm) 1 Area: (cm) 3.927 Volume: (cm) 3.927 Wound Description Classification: Grade 2 Wound Margin: Flat and Intact Exudate Amount: Medium Exudate Type: Serosanguineous Exudate Color: red, brown Wound Bed Granulation Amount: Medium (34-66%) Granulation Quality: Pink Necrotic  Amount: Medium (34-66%) Necrotic Quality: Adherent Slough fter Cleansing: No ino Yes Exposed Structure ed: No ubcutaneous Tissue) Exposed: Yes ed: No ed: No d: No : No % Reduction in Area: -78.6% % Reduction in Volume: -48.8% Epithelialization: Small (1-33%) Tunneling: No Undermining: No Foul Odor A Slough/Fibr Fascia Expos Fat Layer (S Tendon Expos Muscle Expos Joint Expose Bone Exposed Treatment Notes Wound #2 (Right Metatarsal head second) 1. Cleanse With Wound Cleanser 3. Primary Dressing Applied Calcium Alginate Ag 4. Secondary Dressing Dry Gauze Roll Gauze Drawtex 5. Secured With [employer]) Signed: 11/12/2019 4:40:42 PM By: Benjaman Kindler EMT/HBOT Entered By: Benjaman Kindler on 11/12/2019 14:52:38 -------------------------------------------------------------------------------- Vitals Details Patient Name: Date of Service: CURVIN, HUNGER 11/09/2019 12:30 PM Medical Record  [id number] Patient Account Number: [id number] Date of Birth/Sex: Treating RN: 20-Feb-1961 (58 y.o. Judie Petit) Yevonne Pax Primary Care Alejos Reinhardt: [name] Other Clinician: Referring Shante Archambeault: Treating Martin Belling/Extender:Robson, Zebedee Iba, Duanne Limerick in Treatment: 1 Vital Signs Time Taken: 12:53 Temperature (F): 98.2 Height (in): 73 Pulse (bpm): 78 Weight (lbs): 260 Respiratory Rate (breaths/min): 18 Body Mass Index (BMI): 34.3 Blood Pressure (mmHg): 145/73 Reference Range: 80 - 120 mg / dl Electronic Signature(s) Signed: 11/12/2019 6:01:30 PM By: Yevonne Pax RN Entered By: Yevonne Pax on 11/09/2019 12:55:07

## 2019-11-14 ENCOUNTER — Encounter (HOSPITAL_BASED_OUTPATIENT_CLINIC_OR_DEPARTMENT_OTHER): Payer: Medicare (Managed Care) | Admitting: Physician Assistant

## 2019-11-14 ENCOUNTER — Other Ambulatory Visit: Payer: Self-pay

## 2019-11-14 DIAGNOSIS — E11621 Type 2 diabetes mellitus with foot ulcer: Secondary | ICD-10-CM | POA: Diagnosis not present

## 2019-11-14 NOTE — Progress Notes (Addendum)
[name], Stefanos ([id number]) Visit Report for 11/14/2019 Chief Complaint Document Details Patient Name: Date of Service: [name], [name] 11/14/2019 3:30 PM Medical Record [id number] Patient Account Number: [id number] Date of Birth/Sex: Treating RN: 01/19/1961 (59 y.o. M) Primary Care Provider: Cheryll CockayneBurns, Stacy Other Clinician: Referring Provider: Treating Provider/Extender:Stone III, Dayton MartesHoyt Burns, Stacy Weeks in Treatment: 2 Information Obtained from: Patient Chief Complaint Bilateral foot diabetic ulcers Electronic Signature(s) Signed: 11/14/2019 3:47:26 PM By: Lenda KelpStone III, Ayra Hodgdon PA-C Entered By: Lenda KelpStone III, Daaron Dimarco on 11/14/2019 15:47:25 -------------------------------------------------------------------------------- Debridement Details Patient Name: Date of Service: [name], [name] 11/14/2019 3:30 PM Medical Record [id number] Patient Account Number: [id number] Date of Birth/Sex: Treating RN: 09/19/1961 (59 y.o. Damaris SchoonerM) Boehlein, Linda Primary Care Provider: Cheryll CockayneBurns, Stacy Other Clinician: Referring Provider: Treating Provider/Extender:Stone III, Dayton MartesHoyt Burns, Stacy Weeks in Treatment: 2 Debridement Performed for Wound #2 Right Metatarsal head second Assessment: Performed By: Physician Lenda KelpStone III, Yuri Fana, PA Debridement Type: Debridement Severity of Tissue Pre Fat layer exposed Debridement: Level of Consciousness (Pre- Awake and Alert procedure): Pre-procedure Verification/Time Out Taken: Yes - 16:50 Start Time: 16:54 Pain Control: Other : benzocaine 20% spray Total Area Debrided (L x W): 2.6 (cm) x 1.7 (cm) = 4.42 (cm) Tissue and other material Viable, Non-Viable, Callus, Slough, Subcutaneous, Slough debrided: Level: Skin/Subcutaneous Tissue Debridement Description: Excisional Instrument: Curette Bleeding: Minimum Hemostasis Achieved: Pressure End Time: 17:01 Procedural Pain: 0 Post Procedural Pain: 0 Response to Treatment: Procedure was tolerated well Level of Consciousness  Awake and Alert (Post-procedure): Post Debridement Measurements of Total Wound Length: (cm) 2.6 Width: (cm) 1.7 Depth: (cm) 0.6 Volume: (cm) 2.083 Character of Wound/Ulcer Post Improved Debridement: Severity of Tissue Post Debridement: Fat layer exposed Post Procedure Diagnosis Same as Pre-procedure Electronic Signature(s) Signed: 11/14/2019 6:10:09 PM By: Lenda KelpStone III, Rondel Episcopo PA-C Signed: 11/14/2019 6:17:53 PM By: Zenaida DeedBoehlein, Linda RN, BSN Entered By: Zenaida DeedBoehlein, Linda on 11/14/2019 17:04:02 -------------------------------------------------------------------------------- Debridement Details Patient Name: Date of Service: [name], [name] 11/14/2019 3:30 PM Medical Record [id number] Patient Account Number: [id number] Date of Birth/Sex: Treating RN: 12/01/1960 (59 y.o. Damaris SchoonerM) Boehlein, Linda Primary Care Provider: Cheryll CockayneBurns, Stacy Other Clinician: Referring Provider: Treating Provider/Extender:Stone III, Dayton MartesHoyt Burns, Stacy Weeks in Treatment: 2 Debridement Performed for Wound #1 Left,Plantar Foot Assessment: Performed By: Physician Lenda KelpStone III, Reon Hunley, PA Debridement Type: Debridement Severity of Tissue Pre Fat layer exposed Debridement: Level of Consciousness (Pre- Awake and Alert procedure): Pre-procedure Verification/Time Out Taken: Yes - 16:50 Start Time: 17:02 Pain Control: Other : benzocaine 20% spray Total Area Debrided (L x W): 3.3 (cm) x 3.5 (cm) = 11.55 (cm) Tissue and other material Viable, Non-Viable, Callus, Slough, Subcutaneous, Slough Viable, Non-Viable, Callus, Slough, Subcutaneous, Slough debrided: Level: Skin/Subcutaneous Tissue Debridement Description: Excisional Instrument: Curette Bleeding: Minimum Hemostasis Achieved: Silver Nitrate End Time: 17:07 Procedural Pain: 0 Post Procedural Pain: 0 Response to Treatment: Procedure was tolerated well Level of Consciousness Awake and Alert (Post-procedure): Post Debridement Measurements of Total Wound Length: (cm)  3.3 Width: (cm) 3.5 Depth: (cm) 0.3 Volume: (cm) 2.721 Character of Wound/Ulcer Post Improved Debridement: Severity of Tissue Post Debridement: Fat layer exposed Post Procedure Diagnosis Same as Pre-procedure Electronic Signature(s) Signed: 11/14/2019 6:10:09 PM By: Lenda KelpStone III, Sri Clegg PA-C Signed: 11/14/2019 6:17:53 PM By: Zenaida DeedBoehlein, Linda RN, BSN Entered By: Zenaida DeedBoehlein, Linda on 11/14/2019 17:10:23 -------------------------------------------------------------------------------- HPI Details Patient Name: Date of Service: [name], [name] 11/14/2019 3:30 PM Medical Record [id number] Patient Account Number: [id number] Date of Birth/Sex: Treating RN: 01/30/1961 (59 y.o. M) Primary Care Provider: Cheryll CockayneBurns, Stacy Other Clinician: Referring Provider: Treating Provider/Extender:Stone III, Dayton MartesHoyt Burns, Stacy Weeks in Treatment: 2  History of Present Illness HPI Description: 10/31/2019 upon evaluation today patient appears to be doing somewhat poorly in regard to his bilateral plantar feet. He has wounds that he tells me have been present since 2012 intermittently off and on. Most recently this has been open for at least the past 6 months to a year. He has been trying to treat this in different ways using Santyl along with various other dressings including Medihoney and even at one point Xeroform. Nothing really has seem to get this completely closed. He was recently in the hospital for cellulitis of his leg subsequently he did have x-rays as well as MRIs that showed negative for any signs of osteomyelitis in regard to the wounds on his feet. Fortunately there is no signs of systemic infection at this time. No fevers, chills, nausea, vomiting, or diarrhea. Patient has previously used Darco offloading shoes as far as frontal floaters as well as postop surgical shoes. He has never been in a total contact cast that may be something we need to strongly consider here. Patient's most recent hemoglobin A1c  1 month ago was 5.3 seems to be very well controlled which is great. Subsequently he has seen vascular as well as podiatry. His ABIs are 1.07 on the left and 1.14 on the right he seems to be doing well he does have chronic venous stasis. 11/07/2019 upon evaluation today patient appears to be doing well with regard to his wounds all things considered. I do not see any severe worsening he still has some callus buildup on the right more than the left he notes that he has been probably more active than he should as far as walking is concerned is just very hard to not be active. He knows he needs to be more careful in this regard however. He is willing to give the cast a try at this point although he notes that he is a little nervous about this just with regard to balance although he will be very careful and obviously if he has any trouble he knows to contact the office and let me know. 1/22; patient is in for his obligatory first total contact cast change. Our intake nurse reported a very large amount of drainage which is spelled out over to the surrounding skin. Has bilateral diabetic foot wounds. He has Charcot feet. We have been using silver alginate on his wounds. 11/14/2019 on evaluation today patient is actually seeming to make good improvement in regard to his bilateral plantar foot wounds. We have been using a cast on the left side and on the right side he has been using dressings he is changing up his own accord. With that being said he tells me that he is also not walking as much just due to how unsteady he feels. He takes it easy when he does have to walk and when he does not have to walk he is resting. This is probably help in his right foot as well has the left foot which is actually measuring better. In fact both are measuring better. Overall I am very pleased with how things seem to be progressing. The patient does have some odor on the left foot this does have me concerned about the  possibility of infection, and actually probably go ahead and put him on antibiotics today as well as utilizing a continuation of the cast on the left foot I think that will be fine we probably just need to bring him in sooner to change this  not last a whole week. Electronic Signature(s) Signed: 11/14/2019 5:15:52 PM By: Lenda Kelp PA-C Entered By: Lenda Kelp on 11/14/2019 17:15:52 -------------------------------------------------------------------------------- Paring/cutting of benign hyperkeratotic lesion >4 Details Patient Name: Date of Service: [name], [name] 11/14/2019 3:30 PM Medical Record [id number] Patient Account Number: [id number] Date of Birth/Sex: Treating RN: [birth year] (59 y.o. [name] Primary Care Provider: [name] Other Clinician: Referring Provider: Treating Provider/Extender:Stone III, Dayton Martes, Duanne Limerick in Treatment: 2 Procedure Performed for: Non-Wound Location Performed By: Physician Lenda Kelp, PA Post Procedure Diagnosis Same as Pre-procedure Notes right great toe using #3 disposable curette Electronic Signature(s) Signed: 11/14/2019 6:10:09 PM By: Lenda Kelp PA-C Signed: 11/14/2019 6:17:53 PM By: Zenaida Deed RN, BSN Entered By: Zenaida Deed on 11/14/2019 17:10:06 -------------------------------------------------------------------------------- Physical Exam Details Patient Name: Date of Service: [name], [name] 11/14/2019 3:30 PM Medical Record [id number] Patient Account Number: [id number] Date of Birth/Sex: Treating RN: 1961/10/10 (59 y.o. M) Primary Care Provider: [name] Other Clinician: Referring Provider: Treating Provider/Extender:Stone III, Dayton Martes, Stacy Weeks in Treatment: 2 Constitutional Well-nourished and well-hydrated in no acute distress. Respiratory normal breathing without difficulty. Psychiatric this patient is able to make decisions and demonstrates good insight into  disease process. Alert and Oriented x 3. pleasant and cooperative. Notes Upon inspection today patient's wound actually showed signs at both locations of improvement dramatically in my opinion. Fortunately there is no signs of systemic infection at this point which is good news. With that being said I am concerned about local infection on the left plantar foot. After a sufficient and good debridement I did actually culture from the base of the wound today. The patient tolerated this without complication and post debridement wound bed appears to be doing better. I did have to use a little bit of silver nitrate over a portion of the wound to achieve hemostasis. With that being said overall I feel like things are in general doing much better. In regard to the right plantar foot I did remove a little bit of slough and necrotic tissue from the surface of the wound along with cleaning away some of the callus around the edges of the wound he tolerated both without complication. Electronic Signature(s) Signed: 11/14/2019 5:17:13 PM By: Lenda Kelp PA-C Entered By: Lenda Kelp on 11/14/2019 17:17:12 -------------------------------------------------------------------------------- Physician Orders Details Patient Name: Date of Service: [name], [name] 11/14/2019 3:30 PM Medical Record [id number] Patient Account Number: [id number] Date of Birth/Sex: Treating RN: [birth year] (59 y.o. [name] Primary Care Provider: [name] Other Clinician: Referring Provider: Treating Provider/Extender:Stone III, Dayton Martes, Duanne Limerick in Treatment: 2 Verbal / Phone Orders: No Diagnosis Coding ICD-10 Coding Code Description E11.622 Type 2 diabetes mellitus with other skin ulcer L97.522 Non-pressure chronic ulcer of other part of left foot with fat layer exposed L97.512 Non-pressure chronic ulcer of other part of right foot with fat layer exposed I10 Essential (primary)  hypertension I50.42 Chronic combined systolic (congestive) and diastolic (congestive) heart failure I87.2 Venous insufficiency (chronic) (peripheral) Follow-up Appointments Return Appointment in 1 week. - Wed Return Appointment in: - Friday for cast change Dressing Change Frequency Wound #1 Left,Plantar Foot Do not change entire dressing for one week. Wound #2 Right Metatarsal head second Change Dressing every other day. Skin Barriers/Peri-Wound Care Wound #1 Left,Plantar Foot Barrier cream - to periwound Wound Cleansing Wound #1 Left,Plantar Foot May shower with protection. - cast protector Wound #2 Right Metatarsal head second May shower and wash wound with soap and water. Primary Wound  Dressing Wound #1 Left,Plantar Foot Calcium Alginate with Silver - cut to fit wound bed Wound #2 Right Metatarsal head second Calcium Alginate with Silver - pack into wound bed Secondary Dressing Wound #1 Left,Plantar Foot ABD pad Drawtex - cut to fit inside wound margins Carboflex Wound #2 Right Metatarsal head second Kerlix/Rolled Gauze Dry Gauze Other: - felt callous pad Edema Control Patient to wear own compression stockings - daily to right leg Avoid standing for long periods of time Elevate legs to the level of the heart or above for 30 minutes daily and/or when sitting, a frequency of: Off-Loading Total Contact Cast to Left Lower Extremity - foam padding between left great and 2nd toe Open toe surgical shoe to: - right foot daily Laboratory Bacteria identified in Unspecified specimen by Anaerobe culture (MICRO) - left plantar foot LOINC Code: 635-3 Convenience Name: Anerobic culture Patient Medications Allergies: Claritin Notifications Medication Indication Start End Bactrim DS 11/14/2019 DOSE 1 - oral 800 mg-160 mg tablet - 1 tablet oral taken 2 times a day for 14 days. Do not take potassium while on this antibiotic Electronic Signature(s) Signed: 11/14/2019 5:18:56 PM By:  Lenda Kelp PA-C Entered By: Lenda Kelp on 11/14/2019 17:18:56 -------------------------------------------------------------------------------- Problem List Details Patient Name: Date of Service: [name], [name] 11/14/2019 3:30 PM Medical Record [id number] Patient Account Number: [id number] Date of Birth/Sex: Treating RN: [birth year] (59 y.o. M) Primary Care Provider: [name] Other Clinician: Referring Provider: Treating Provider/Extender:Stone III, Dayton Martes, Stacy Weeks in Treatment: 2 Active Problems ICD-10 Evaluated Encounter Code Description Active Date Today Diagnosis E11.622 Type 2 diabetes mellitus with other skin ulcer 10/31/2019 No Yes L97.522 Non-pressure chronic ulcer of other part of left foot 10/31/2019 No Yes with fat layer exposed L97.512 Non-pressure chronic ulcer of other part of right foot 10/31/2019 No Yes with fat layer exposed I10 Essential (primary) hypertension 10/31/2019 No Yes I50.42 Chronic combined systolic (congestive) and diastolic 10/31/2019 No Yes (congestive) heart failure I87.2 Venous insufficiency (chronic) (peripheral) 10/31/2019 No Yes L84 Corns and callosities 11/14/2019 No Yes Inactive Problems Resolved Problems Electronic Signature(s) Signed: 11/14/2019 5:20:47 PM By: Lenda Kelp PA-C Previous Signature: 11/14/2019 3:47:20 PM Version By: Lenda Kelp PA-C Entered By: Lenda Kelp on 11/14/2019 17:20:46 -------------------------------------------------------------------------------- Progress Note Details Patient Name: Date of Service: [name], [name] 11/14/2019 3:30 PM Medical Record [id number] Patient Account Number: [id number] Date of Birth/Sex: Treating RN: [birth year] (59 y.o. M) Primary Care Provider: [name] Other Clinician: Referring Provider: Treating Provider/Extender:Stone III, Dayton Martes, Stacy Weeks in Treatment: 2 Subjective Chief Complaint Information obtained from Patient Bilateral foot  diabetic ulcers History of Present Illness (HPI) 10/31/2019 upon evaluation today patient appears to be doing somewhat poorly in regard to his bilateral plantar feet. He has wounds that he tells me have been present since 2012 intermittently off and on. Most recently this has been open for at least the past 6 months to a year. He has been trying to treat this in different ways using Santyl along with various other dressings including Medihoney and even at one point Xeroform. Nothing really has seem to get this completely closed. He was recently in the hospital for cellulitis of his leg subsequently he did have x-rays as well as MRIs that showed negative for any signs of osteomyelitis in regard to the wounds on his feet. Fortunately there is no signs of systemic infection at this time. No fevers, chills, nausea, vomiting, or diarrhea. Patient has previously used Darco offloading shoes as far as frontal floaters  as well as postop surgical shoes. He has never been in a total contact cast that may be something we need to strongly consider here. Patient's most recent hemoglobin A1c 1 month ago was 5.3 seems to be very well controlled which is great. Subsequently he has seen vascular as well as podiatry. His ABIs are 1.07 on the left and 1.14 on the right he seems to be doing well he does have chronic venous stasis. 11/07/2019 upon evaluation today patient appears to be doing well with regard to his wounds all things considered. I do not see any severe worsening he still has some callus buildup on the right more than the left he notes that he has been probably more active than he should as far as walking is concerned is just very hard to not be active. He knows he needs to be more careful in this regard however. He is willing to give the cast a try at this point although he notes that he is a little nervous about this just with regard to balance although he will be very careful and obviously if he has any  trouble he knows to contact the office and let me know. 1/22; patient is in for his obligatory first total contact cast change. Our intake nurse reported a very large amount of drainage which is spelled out over to the surrounding skin. Has bilateral diabetic foot wounds. He has Charcot feet. We have been using silver alginate on his wounds. 11/14/2019 on evaluation today patient is actually seeming to make good improvement in regard to his bilateral plantar foot wounds. We have been using a cast on the left side and on the right side he has been using dressings he is changing up his own accord. With that being said he tells me that he is also not walking as much just due to how unsteady he feels. He takes it easy when he does have to walk and when he does not have to walk he is resting. This is probably help in his right foot as well has the left foot which is actually measuring better. In fact both are measuring better. Overall I am very pleased with how things seem to be progressing. The patient does have some odor on the left foot this does have me concerned about the possibility of infection, and actually probably go ahead and put him on antibiotics today as well as utilizing a continuation of the cast on the left foot I think that will be fine we probably just need to bring him in sooner to change this not last a whole week. Objective Constitutional Well-nourished and well-hydrated in no acute distress. Vitals Time Taken: 3:54 PM, Height: 73 in, Weight: 260 lbs, BMI: 34.3, Temperature: 98.9 F, Pulse: 102 bpm, Respiratory Rate: 20 breaths/min, Blood Pressure: 145/78 mmHg. Respiratory normal breathing without difficulty. Psychiatric this patient is able to make decisions and demonstrates good insight into disease process. Alert and Oriented x 3. pleasant and cooperative. General Notes: Upon inspection today patient's wound actually showed signs at both locations of  improvement dramatically in my opinion. Fortunately there is no signs of systemic infection at this point which is good news. With that being said I am concerned about local infection on the left plantar foot. After a sufficient and good debridement I did actually culture from the base of the wound today. The patient tolerated this without complication and post debridement wound bed appears to be doing better. I did have to use  a little bit of silver nitrate over a portion of the wound to achieve hemostasis. With that being said overall I feel like things are in general doing much better. In regard to the right plantar foot I did remove a little bit of slough and necrotic tissue from the surface of the wound along with cleaning away some of the callus around the edges of the wound he tolerated both without complication. Integumentary (Hair, Skin) Wound #1 status is Open. Original cause of wound was Gradually Appeared. The wound is located on the Trinity. The wound measures 3.3cm length x 3.5cm width x 0.3cm depth; 9.071cm^2 area and 2.721cm^3 volume. There is Fat Layer (Subcutaneous Tissue) Exposed exposed. There is no tunneling or undermining noted. There is a large amount of serosanguineous drainage noted. The wound margin is flat and intact. There is medium (34-66%) pink granulation within the wound bed. There is a medium (34-66%) amount of necrotic tissue within the wound bed including Adherent Slough. General Notes: macerated periwound. Wound #2 status is Open. Original cause of wound was Gradually Appeared. The wound is located on the Right Metatarsal head second. The wound measures 2.6cm length x 1.7cm width x 0.6cm depth; 3.471cm^2 area and 2.083cm^3 volume. There is Fat Layer (Subcutaneous Tissue) Exposed exposed. There is no tunneling or undermining noted. There is a medium amount of serosanguineous drainage noted. The wound margin is flat and intact. There is large (67-100%)  pink granulation within the wound bed. There is a small (1-33%) amount of necrotic tissue within the wound bed including Adherent Slough. Assessment Active Problems ICD-10 Type 2 diabetes mellitus with other skin ulcer Non-pressure chronic ulcer of other part of left foot with fat layer exposed Non-pressure chronic ulcer of other part of right foot with fat layer exposed Essential (primary) hypertension Chronic combined systolic (congestive) and diastolic (congestive) heart failure Venous insufficiency (chronic) (peripheral) Corns and callosities Procedures Wound #1 Pre-procedure diagnosis of Wound #1 is a Diabetic Wound/Ulcer of the Lower Extremity located on the Left,Plantar Foot .Severity of Tissue Pre Debridement is: Fat layer exposed. There was a Excisional Skin/Subcutaneous Tissue Debridement with a total area of 11.55 sq cm performed by Worthy Keeler, PA. With the following instrument(s): Curette to remove Viable and Non-Viable tissue/material. Material removed includes Callus, Subcutaneous Tissue, and Slough after achieving pain control using Other (benzocaine 20% spray). No specimens were taken. A time out was conducted at 16:50, prior to the start of the procedure. A Minimum amount of bleeding was controlled with Silver Nitrate. The procedure was tolerated well with a pain level of 0 throughout and a pain level of 0 following the procedure. Post Debridement Measurements: 3.3cm length x 3.5cm width x 0.3cm depth; 2.721cm^3 volume. Character of Wound/Ulcer Post Debridement is improved. Severity of Tissue Post Debridement is: Fat layer exposed. Post procedure Diagnosis Wound #1: Same as Pre-Procedure Pre-procedure diagnosis of Wound #1 is a Diabetic Wound/Ulcer of the Lower Extremity located on the Left,Plantar Foot . There was a Total Contact Cast Procedure by Worthy Keeler, PA. Post procedure Diagnosis Wound #1: Same as Pre-Procedure Wound #2 Pre-procedure diagnosis of Wound  #2 is a Diabetic Wound/Ulcer of the Lower Extremity located on the Right Metatarsal head second .Severity of Tissue Pre Debridement is: Fat layer exposed. There was a Excisional Skin/Subcutaneous Tissue Debridement with a total area of 4.42 sq cm performed by Worthy Keeler, PA. With the following instrument(s): Curette to remove Viable and Non-Viable tissue/material. Material removed includes Callus, Subcutaneous Tissue,  and Slough after achieving pain control using Other (benzocaine 20% spray). No specimens were taken. A time out was conducted at 16:50, prior to the start of the procedure. A Minimum amount of bleeding was controlled with Pressure. The procedure was tolerated well with a pain level of 0 throughout and a pain level of 0 following the procedure. Post Debridement Measurements: 2.6cm length x 1.7cm width x 0.6cm depth; 2.083cm^3 volume. Character of Wound/Ulcer Post Debridement is improved. Severity of Tissue Post Debridement is: Fat layer exposed. Post procedure Diagnosis Wound #2: Same as Pre-Procedure A Paring/cutting of benign hyperkeratotic lesion >4 procedure was performed. by Lenda Kelp, PA. Post procedure Diagnosis Wound #: Same as Pre-Procedure Notes: right great toe using #3 disposable curette Plan Follow-up Appointments: Return Appointment in 1 week. - Wed Return Appointment in: - Friday for cast change Dressing Change Frequency: Wound #1 Left,Plantar Foot: Do not change entire dressing for one week. Wound #2 Right Metatarsal head second: Change Dressing every other day. Skin Barriers/Peri-Wound Care: Wound #1 Left,Plantar Foot: Barrier cream - to periwound Wound Cleansing: Wound #1 Left,Plantar Foot: May shower with protection. - cast protector Wound #2 Right Metatarsal head second: May shower and wash wound with soap and water. Primary Wound Dressing: Wound #1 Left,Plantar Foot: Calcium Alginate with Silver - cut to fit wound bed Wound #2 Right  Metatarsal head second: Calcium Alginate with Silver - pack into wound bed Secondary Dressing: Wound #1 Left,Plantar Foot: ABD pad Drawtex - cut to fit inside wound margins Carboflex Wound #2 Right Metatarsal head second: Kerlix/Rolled Gauze Dry Gauze Other: - felt callous pad Edema Control: Patient to wear own compression stockings - daily to right leg Avoid standing for long periods of time Elevate legs to the level of the heart or above for 30 minutes daily and/or when sitting, a frequency of: Off-Loading: Total Contact Cast to Left Lower Extremity - foam padding between left great and 2nd toe Open toe surgical shoe to: - right foot daily Laboratory ordered were: Anerobic culture - left plantar foot The following medication(s) was prescribed: Bactrim DS oral 800 mg-160 mg tablet 1 1 tablet oral taken 2 times a day for 14 days. Do not take potassium while on this antibiotic starting 11/14/2019 1. I am going to go ahead and recommend that we initiate treatment with a continuation of the total contact cast on the left foot. We will however bring him back on Friday to change this out due to the odor and drainage I do not want to keep it on the whole week and being Wednesday today there really was not a great time to bring him in but I think with the debridement being today he is more likely draining over the next 2 days. He is in agreement with that plan. 2. I am also going to go ahead and place the patient on Bactrim DS for the next 14 days. We will see what the culture shows and make any adjustments as necessary. 3. I will go ahead and recommend that we continue with the silver alginate dressing to both wound locations. The patient is in agreement with that plan. We will use a little bit of zinc around the periwound underneath the cast. This can be done on the right plantar foot as well. 4. We will also use some CarboFlex in the cast to try to help with odor control at this time. 5.  The patient did have a callus buildup on the right medial great toe. I  actually did perform callus paring here to clear this way there was no wound underlying and no bleeding occurred with the callus paring. He tolerated this excellent. We will see patient back for reevaluation in 1 week here in the clinic. If anything worsens or changes patient will contact our office for additional recommendations. Electronic Signature(s) Signed: 11/14/2019 5:21:38 PM By: Lenda Kelp PA-C Previous Signature: 11/14/2019 5:20:02 PM Version By: Lenda Kelp PA-C Entered By: Lenda Kelp on 11/14/2019 17:21:38 -------------------------------------------------------------------------------- Total Contact Cast Details Patient Name: Date of Service: NISHAAN, STANKE 11/14/2019 3:30 PM Medical Record [id number] Patient Account Number: [id number] Date of Birth/Sex: Treating RN: [birth year] (59 y.o. [name] Primary Care Provider: [name] Other Clinician: Referring Provider: Treating Provider/Extender:Stone III, Dayton Martes, Duanne Limerick in Treatment: 2 Total Contact Cast Applied for Wound Assessment: Wound #1 Left,Plantar Foot Performed By: Physician Lenda Kelp, PA Post Procedure Diagnosis Same as Pre-procedure Electronic Signature(s) Signed: 11/14/2019 6:10:09 PM By: Lenda Kelp PA-C Signed: 11/14/2019 6:17:53 PM By: Zenaida Deed RN, BSN Entered By: Zenaida Deed on 11/14/2019 16:57:44 -------------------------------------------------------------------------------- SuperBill Details Patient Name: Date of Service: EZEQUIEL, MACAULEY 11/14/2019 Medical Record [id number] Patient Account Number: [id number] Date of Birth/Sex: Treating RN: [birth year] (59 y.o. 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 I10 Essential (primary) hypertension I50.42 Chronic combined systolic (congestive) and diastolic (congestive) heart failure I87.2 Venous insufficiency (chronic) (peripheral) L84 Corns and callosities Facility Procedures CPT4 Code Description: 33295188 11042 - DEB SUBQ TISSUE 20 SQ CM/< ICD-10 Diagnosis Description L97.522 Non-pressure chronic ulcer of other part of left foot with L97.512 Non-pressure chronic ulcer of other part of right foot wit Modifier: fat layer expos h fat layer expo Quantity: 1 ed sed CPT4 Code Description: 41660630 11055 - PARE BENIGN LES; SGL ICD-10 Diagnosis Description L84 Corns and callosities Modifier: 59 Quantity: 1 Physician Procedures CPT4 Code Description: 1601093 99214 - WC PHYS LEVEL 4 - EST PT ICD-10 Diagnosis Description E11.622 Type 2 diabetes mellitus with other skin ulcer L97.522 Non-pressure chronic ulcer of other part of left foot wi L97.512 Non-pressure chronic ulcer of  other part of right foot w I10 Essential (primary) hypertension Modifier: 25 th fat layer ex ith fat layer e Quantity: 1 posed xposed CPT4 Code Description: 2355732 11042 - WC PHYS SUBQ TISS 20 SQ CM ICD-10 Diagnosis Description L97.522 Non-pressure chronic ulcer of other part of left foot with L97.512 Non-pressure chronic ulcer of other part of right foot with Modifier: fat layer expos fat layer expo Quantity: 1 ed sed CPT4 Code Description: 2025427 11055 - WC PHYS PARE BENIGN LES; SGL ICD-10 Diagnosis Description L84 Corns and callosities Modifier: 59 Quantity: 1 Electronic Signature(s) Signed: 11/14/2019 5:21:55 PM By: Lenda Kelp PA-C Previous Signature: 11/14/2019 5:20:28 PM Version By: Lenda Kelp PA-C Entered By: Lenda Kelp on  11/14/2019 17:21:55

## 2019-11-14 NOTE — Progress Notes (Addendum)
[name], [name] ([id number]) Visit Report for 11/14/2019 Arrival Information Details Patient Name: Date of Service: [name], [name] 11/14/2019 3:30 PM Medical Record [id number] Patient Account Number: [id number] Date of Birth/Sex: Treating RN: 10/28/1960 (59 y.o. [name] Primary Care Nicklaus Alviar: [name] Other Clinician: Referring Cyan Moultrie: Treating Hillery Zachman/Extender:Stone III, Dayton Martes, Stacy Weeks in Treatment: 2 Visit Information History Since Last Visit Added or deleted any medications: No Patient Arrived: [name] Any new allergies or adverse reactions: No Arrival Time: 15:53 Had a fall or experienced change in No Accompanied By: alone activities of daily living that may affect Transfer Assistance: None risk of falls: Patient Identification Verified: Yes Signs or symptoms of abuse/neglect No Secondary Verification Process Yes since last visito Completed: Hospitalized since last visit: No Patient Requires Transmission-Based No Implantable device outside of the clinic No Precautions: excluding Patient Has Alerts: Yes cellular tissue based products placed in Patient Alerts: L ABI 1.07 the center (04/2019) since last visit: R ABI 1.14 Has Dressing in Place as Prescribed: Yes (04/2019) Has Footwear/Offloading in Place as Yes Prescribed: Left: Total Contact Cast Pain Present Now: Yes Electronic Signature(s) Signed: 11/14/2019 5:26:35 PM By: Shawn Stall Entered By: Shawn Stall on 11/14/2019 15:54:10 -------------------------------------------------------------------------------- Lower Extremity Assessment Details Patient Name: Date of Service: [name], [name] 11/14/2019 3:30 PM Medical Record [id number] Patient Account Number: [id number] Date of Birth/Sex: Treating RN: 02-Sep-1961 (59 y.o. [name] Primary Care Kaysan Peixoto: [name] Other Clinician: Referring Edmundo Tedesco: Treating Shourya Macpherson/Extender:Stone III, Dayton Martes, Stacy Weeks in  Treatment: 2 Edema Assessment Assessed: [Left: Yes] [Right: Yes] Edema: [Left: Yes] [Right: Yes] Calf Left: Right: Point of Measurement: 30 cm From Medial Instep 43.5 cm 41 cm Ankle Left: Right: Point of Measurement: 11 cm From Medial Instep 23 cm 28 cm Vascular Assessment Pulses: Dorsalis Pedis Palpable: [Left:Yes] [Right:Yes] Electronic Signature(s) Signed: 11/14/2019 5:26:35 PM By: Shawn Stall Entered By: Shawn Stall on 11/14/2019 16:06:14 -------------------------------------------------------------------------------- Multi-Disciplinary Care Plan Details Patient Name: Date of Service: [name], [name] 11/14/2019 3:30 PM Medical Record [id number] Patient Account Number: [id number] Date of Birth/Sex: Treating RN: 01-Mar-1961 (59 y.o. [name] Primary Care Tian Mcmurtrey: [name] Other Clinician: Referring Marcene Laskowski: Treating Canisha Issac/Extender:Stone III, Dayton Martes, Stacy Weeks in Treatment: 2 Active Inactive Nutrition Nursing Diagnoses: Potential for alteratiion in Nutrition/Potential for imbalanced nutrition Goals: Patient/caregiver will maintain therapeutic glucose control Date Initiated: 10/31/2019 Target Resolution Date: 11/28/2019 Goal Status: Active Interventions: Assess HgA1c results as ordered upon admission and as needed Provide education on elevated blood sugars and impact on wound healing Treatment Activities: Patient referred to Primary Care Physician for further nutritional evaluation : 10/31/2019 Notes: Peripheral Neuropathy Nursing Diagnoses: Knowledge deficit related to disease process and management of peripheral neurovascular dysfunction Goals: Patient/caregiver will verbalize understanding of disease process and disease management Date Initiated: 10/31/2019 Target Resolution Date: 11/28/2019 Goal Status: Active Interventions: Assess signs and symptoms of neuropathy upon admission and as needed Provide education on Management of  Neuropathy and Related Ulcers Treatment Activities: Patient referred for customized footwear/offloading : 10/31/2019 Notes: Wound/Skin Impairment Nursing Diagnoses: Impaired tissue integrity Knowledge deficit related to ulceration/compromised skin integrity Goals: Patient/caregiver will verbalize understanding of skin care regimen Date Initiated: 10/31/2019 Target Resolution Date: 12/26/2019 Goal Status: Active Ulcer/skin breakdown will have a volume reduction of 30% by week 4 Date Initiated: 10/31/2019 Target Resolution Date: 11/28/2019 Goal Status: Active Interventions: Assess patient/caregiver ability to obtain necessary supplies Assess patient/caregiver ability to perform ulcer/skin care regimen upon admission and as needed Assess ulceration(s) every visit Provide education on ulcer and skin care Treatment  Activities: Skin care regimen initiated : 10/31/2019 Topical wound management initiated : 10/31/2019 Notes: Electronic Signature(s) Signed: 11/14/2019 6:17:53 PM By: Baruch Gouty RN, BSN Entered By: Baruch Gouty on 11/14/2019 16:56:37 -------------------------------------------------------------------------------- Pain Assessment Details Patient Name: Date of Service: [name], [name] 11/14/2019 3:30 PM Medical Record [id number] Patient Account Number: [id number] Date of Birth/Sex: Treating RN: 1960/12/29 (59 y.o. [name] Primary Care Pope Brunty: Billey Gosling Other Clinician: Referring Kataleya Zaugg: Treating Abimbola Aki/Extender:Stone III, Elby Showers, Stacy Weeks in Treatment: 2 Active Problems Location of Pain Severity and Description of Pain Patient Has Paino Yes Site Locations Pain Location: Generalized Pain Rate the pain. Current Pain Level: 6 Worst Pain Level: 10 Least Pain Level: 0 Tolerable Pain Level: 8 Character of Pain Describe the Pain: Aching, Heavy Pain Management and Medication Current Pain Management: Medication: No Cold Application:  No Rest: No Massage: No Activity: No T.E.N.S.: No Heat Application: No Leg drop or elevation: No Is the Current Pain Management Adequate: Adequate How does your wound impact your activities of daily livingo Sleep: No Bathing: No Appetite: No Relationship With Others: No Bladder Continence: No Emotions: No Bowel Continence: No Work: No Toileting: No Drive: No Dressing: No Hobbies: No Electronic Signature(s) Signed: 11/14/2019 5:26:35 PM By: Deon Pilling Entered By: Deon Pilling on 11/14/2019 15:54:35 -------------------------------------------------------------------------------- Patient/Caregiver Education Details Patient Name: Date of Service: [name], [name] 1/27/2021andnbsp3:30 PM Medical Record [id number] Patient Account Number: [id number] Date of Birth/Gender: [birth year] (58 y.o. M) Treating RN: Baruch Gouty Primary Care Physician: Billey Gosling Other Clinician: Referring Physician: Treating Physician/Extender:Stone III, Elby Showers, Nadine Counts in Treatment: 2 Education Assessment Education Provided To: Patient Education Topics Provided Infection: Methods: Explain/Verbal Responses: Reinforcements needed, State content correctly Offloading: Methods: Explain/Verbal Responses: Reinforcements needed, State content correctly Wound/Skin Impairment: Methods: Explain/Verbal Responses: Reinforcements needed, State content correctly Electronic Signature(s) Signed: 11/14/2019 6:17:53 PM By: Baruch Gouty RN, BSN Entered By: Baruch Gouty on 11/14/2019 16:57:22 -------------------------------------------------------------------------------- Wound Assessment Details Patient Name: Date of Service: [name], [name] 11/14/2019 3:30 PM Medical Record [id number] Patient Account Number: [id number] Date of Birth/Sex: Treating RN: 05-05-1961 (59 y.o. M) Primary Care Suzzane Quilter: Billey Gosling Other Clinician: Referring Aryssa Rosamond: Treating Mardella Nuckles/Extender:Stone  III, Elby Showers, Stacy Weeks in Treatment: 2 Wound Status Wound Number: 1 Primary Diabetic Wound/Ulcer of the Lower Extremity Etiology: Wound Location: Left Foot - Plantar Wound Open Wounding Event: Gradually Appeared Status: Date Acquired: 02/16/2019 ComorbidAnemia, Sleep Apnea, Congestive Heart Weeks Of Treatment: 2 Weeks Of Treatment: 2 History: Failure, Hypertension, Peripheral Venous Clustered Wound: No Disease, Cirrhosis , Type II Diabetes, Osteoarthritis, Neuropathy Photos Wound Measurements Length: (cm) 3.3 Width: (cm) 3.5 Depth: (cm) 0.3 Area: (cm) 9.071 Volume: (cm) 2.721 Wound Description Classification: Grade 2 Wound Margin: Flat and Intact Exudate Amount: Large Exudate Type: Serosanguineous Exudate Color: red, brown Wound Bed Granulation Amount: Medium (34-66%) Granulation Quality: Pink Necrotic Amount: Medium (34-66%) Necrotic Quality: Adherent Slough After Cleansing: No brino Yes Exposed Structure osed: No (Subcutaneous Tissue) Exposed: Yes osed: No osed: No sed: No ed: No % Reduction in Area: -1.3% % Reduction in Volume: 56.6% Epithelialization: Small (1-33%) Tunneling: No Undermining: No Foul Odor Slough/Fi Fascia Exp Fat Layer Tendon Exp Muscle Exp Joint Expo Bone Expos Assessment Notes macerated periwound. Electronic Signature(s) Signed: 11/22/2019 4:25:19 PM By: Mikeal Hawthorne EMT/HBOT Previous Signature: 11/14/2019 5:26:35 PM Version By: Deon Pilling Entered By: Mikeal Hawthorne on 11/22/2019 11:45:23 -------------------------------------------------------------------------------- Wound Assessment Details Patient Name: Date of Service: [name], [name] 11/14/2019 3:30 PM Medical Record [id number] Patient Account Number: [id number] Date of Birth/Sex: Treating RN: 09/03/1961 (59 y.o.  M) Primary Care Mercedez Boule: [name] Other Clinician: Referring Danel Studzinski: Treating Harpreet Pompey/Extender:Stone III, Dayton Martes, Stacy Weeks in  Treatment: 2 Wound Status Wound Number: 2 Primary Diabetic Wound/Ulcer of the Lower Extremity Etiology: Wound Location: Right Metatarsal head second Wound Open Wounding Event: Gradually Appeared Status: Date Acquired: 02/16/2019 Comorbid Anemia, Sleep Apnea, Congestive Heart Weeks Of Treatment: 2 History: Failure, Hypertension, Peripheral Venous Clustered Wound: No Disease, Cirrhosis , Type II Diabetes, Osteoarthritis, Neuropathy Photos Wound Measurements Length: (cm) 2.6 % Reductio Width: (cm) 1.7 % Reductio Depth: (cm) 0.6 Epithelial Area: (cm) 3.471 Tunneling Volume: (cm) 2.083 Undermini Wound Description Classification: Grade 2 Wound Margin: Flat and Intact Exudate Amount: Medium Exudate Type: Serosanguineous Exudate Color: red, brown Wound Bed Granulation Amount: Large (67-100%) Granulation Quality: Pink Necrotic Amount: Small (1-33%) Necrotic Quality: Adherent Slough Foul Odor After Cleansing: No Slough/Fibrino Yes Exposed Structure Fascia Exposed: No Fat Layer (Subcutaneous Tissue) Exposed: Yes Tendon Exposed: No Muscle Exposed: No Joint Exposed: No Bone Exposed: No n in Area: -57.8% n in Volume: 21.1% ization: Small (1-33%) : No ng: No Electronic Signature(s) Signed: 11/22/2019 4:25:19 PM By: Benjaman Kindler EMT/HBOT Previous Signature: 11/14/2019 5:26:35 PM Version By: Shawn Stall Entered By: Benjaman Kindler on 11/22/2019 11:45:44 -------------------------------------------------------------------------------- Vitals Details Patient Name: Date of Service: [name], [name] 11/14/2019 3:30 PM Medical Record [id number] Patient Account Number: [id number] Date of Birth/Sex: Treating RN: 1961-07-15 (59 y.o. [name] Primary Care Elon Eoff: [name] Other Clinician: Referring Hildagard Sobecki: Treating Reginaldo Hazard/Extender:Stone III, Dayton Martes, Stacy Weeks in Treatment: 2 Vital Signs Time Taken: 15:54 Temperature (F): 98.9 Height (in): 73 Pulse  (bpm): 102 Weight (lbs): 260 Respiratory Rate (breaths/min): 20 Body Mass Index (BMI): 34.3 Blood Pressure (mmHg): 145/78 Reference Range: 80 - 120 mg / dl Electronic Signature(s) Signed: 11/14/2019 5:26:35 PM By: Shawn Stall Entered By: Shawn Stall on 11/14/2019 15:55:07

## 2019-11-15 ENCOUNTER — Other Ambulatory Visit (HOSPITAL_COMMUNITY)
Admission: RE | Admit: 2019-11-15 | Discharge: 2019-11-15 | Disposition: A | Discharge status: Home / Self Care | Payer: Medicare (Managed Care) | Source: Other Acute Inpatient Hospital | Attending: Physician Assistant | Admitting: Physician Assistant

## 2019-11-15 DIAGNOSIS — L089 Local infection of the skin and subcutaneous tissue, unspecified: Secondary | ICD-10-CM | POA: Insufficient documentation

## 2019-11-16 ENCOUNTER — Encounter (HOSPITAL_BASED_OUTPATIENT_CLINIC_OR_DEPARTMENT_OTHER): Payer: Medicare (Managed Care) | Attending: Internal Medicine | Admitting: Internal Medicine

## 2019-11-16 ENCOUNTER — Other Ambulatory Visit: Payer: Self-pay

## 2019-11-16 DIAGNOSIS — I5042 Chronic combined systolic (congestive) and diastolic (congestive) heart failure: Secondary | ICD-10-CM | POA: Diagnosis not present

## 2019-11-16 DIAGNOSIS — I872 Venous insufficiency (chronic) (peripheral): Secondary | ICD-10-CM | POA: Diagnosis not present

## 2019-11-16 DIAGNOSIS — E1140 Type 2 diabetes mellitus with diabetic neuropathy, unspecified: Secondary | ICD-10-CM | POA: Insufficient documentation

## 2019-11-16 DIAGNOSIS — K7460 Unspecified cirrhosis of liver: Secondary | ICD-10-CM | POA: Insufficient documentation

## 2019-11-16 DIAGNOSIS — L97522 Non-pressure chronic ulcer of other part of left foot with fat layer exposed: Secondary | ICD-10-CM | POA: Insufficient documentation

## 2019-11-16 DIAGNOSIS — E11621 Type 2 diabetes mellitus with foot ulcer: Secondary | ICD-10-CM | POA: Insufficient documentation

## 2019-11-16 DIAGNOSIS — M1990 Unspecified osteoarthritis, unspecified site: Secondary | ICD-10-CM | POA: Insufficient documentation

## 2019-11-16 DIAGNOSIS — I110 Hypertensive heart disease with heart failure: Secondary | ICD-10-CM | POA: Diagnosis not present

## 2019-11-16 DIAGNOSIS — D649 Anemia, unspecified: Secondary | ICD-10-CM | POA: Diagnosis not present

## 2019-11-16 DIAGNOSIS — L97512 Non-pressure chronic ulcer of other part of right foot with fat layer exposed: Secondary | ICD-10-CM | POA: Insufficient documentation

## 2019-11-16 DIAGNOSIS — G4730 Sleep apnea, unspecified: Secondary | ICD-10-CM | POA: Insufficient documentation

## 2019-11-16 DIAGNOSIS — L84 Corns and callosities: Secondary | ICD-10-CM | POA: Diagnosis not present

## 2019-11-16 NOTE — Progress Notes (Signed)
ROONEY, SWAILS ([id number]) Visit Report for 11/16/2019 HPI Details Patient Name: Date of Service: THALES, KNIPPLE 11/16/2019 2:15 PM Medical Record [id number] Patient Account Number: [id number] Date of Birth/Sex: Treating RN: 1961/07/02 (59 y.o. M) Primary Care Provider: Billey Gosling Other Clinician: Referring Provider: Treating Provider/Extender:Shailen Thielen, Baird Lyons, Nadine Counts in Treatment: 2 History of Present Illness HPI Description: 10/31/2019 upon evaluation today patient appears to be doing somewhat poorly in regard to his bilateral plantar feet. He has wounds that he tells me have been present since 2012 intermittently off and on. Most recently this has been open for at least the past 6 months to a year. He has been trying to treat this in different ways using Santyl along with various other dressings including Medihoney and even at one point Xeroform. Nothing really has seem to get this completely closed. He was recently in the hospital for cellulitis of his leg subsequently he did have x-rays as well as MRIs that showed negative for any signs of osteomyelitis in regard to the wounds on his feet. Fortunately there is no signs of systemic infection at this time. No fevers, chills, nausea, vomiting, or diarrhea. Patient has previously used Darco offloading shoes as far as frontal floaters as well as postop surgical shoes. He has never been in a total contact cast that may be something we need to strongly consider here. Patient's most recent hemoglobin A1c 1 month ago was 5.3 seems to be very well controlled which is great. Subsequently he has seen vascular as well as podiatry. His ABIs are 1.07 on the left and 1.14 on the right he seems to be doing well he does have chronic venous stasis. 11/07/2019 upon evaluation today patient appears to be doing well with regard to his wounds all things considered. I do not see any severe worsening he still has some callus buildup on the right  more than the left he notes that he has been probably more active than he should as far as walking is concerned is just very hard to not be active. He knows he needs to be more careful in this regard however. He is willing to give the cast a try at this point although he notes that he is a little nervous about this just with regard to balance although he will be very careful and obviously if he has any trouble he knows to contact the office and let me know. 1/22; patient is in for his obligatory first total contact cast change. Our intake nurse reported a very large amount of drainage which is spelled out over to the surrounding skin. Has bilateral diabetic foot wounds. He has Charcot feet. We have been using silver alginate on his wounds. 11/14/2019 on evaluation today patient is actually seeming to make good improvement in regard to his bilateral plantar foot wounds. We have been using a cast on the left side and on the right side he has been using dressings he is changing up his own accord. With that being said he tells me that he is also not walking as much just due to how unsteady he feels. He takes it easy when he does have to walk and when he does not have to walk he is resting. This is probably help in his right foot as well has the left foot which is actually measuring better. In fact both are measuring better. Overall I am very pleased with how things seem to be progressing. The patient does have some odor on the left foot  this does have me concerned about the possibility of infection, and actually probably go ahead and put him on antibiotics today as well as utilizing a continuation of the cast on the left foot I think that will be fine we probably just need to bring him in sooner to change this not last a whole week. 1/29; we brought the patient back today for a total contact cast change on the left out of concern for excessive drainage. We are using drawtex over the wound as the primary  dressing Electronic Signature(s) Signed: 11/16/2019 5:55:42 PM By: Baltazar Najjar MD Entered By: Baltazar Najjar on 11/16/2019 16:17:49 -------------------------------------------------------------------------------- Physical Exam Details Patient Name: Date of Service: RC, AMISON 11/16/2019 2:15 PM Medical Record [id number] Patient Account Number: [id number] Date of Birth/Sex: Treating RN: [birth year] (58 y.o. M) Primary Care Provider: [name] Other Clinician: Referring Provider: Treating Provider/Extender:Charina Fons, Zebedee Iba, Duanne Limerick in Treatment: 2 Constitutional Sitting or standing Blood Pressure is within target range for patient.. Pulse regular and within target range for patient.Marland Kitchen Respirations regular, non-labored and within target range.. Temperature is normal and within the target range for the patient.Marland Kitchen Appears in no distress. Notes Wound exam; the area on the left lateral foot looked quite good. There is no evidence of excessive drainage at the moment no skin maceration. The wound was redressed and a total contact cast applied in the standard fashion. No debridement was required The area on the right foot also looks stable although it has some depth. No change in the dressing here I did Electronic Signature(s) Signed: 11/16/2019 5:55:42 PM By: Baltazar Najjar MD Entered By: Baltazar Najjar on 11/16/2019 16:18:59 -------------------------------------------------------------------------------- Physician Orders Details Patient Name: Date of Service: CHARLES, ANDRINGA 11/16/2019 2:15 PM Medical Record [id number] Patient Account Number: [id number] Date of Birth/Sex: Treating RN: 1961-08-26 (59 y.o. Katherina Right Primary Care Provider: [name] Other Clinician: Referring Provider: Treating Provider/Extender:Alexxus Sobh, Zebedee Iba, Duanne Limerick in Treatment: 2 Verbal / Phone Orders: No Diagnosis Coding Follow-up Appointments Return Appointment  in 1 week. - Wed Return Appointment in: - Friday for cast change Dressing Change Frequency Wound #1 Left,Plantar Foot Do not change entire dressing for one week. Wound #2 Right Metatarsal head second Change Dressing every other day. Skin Barriers/Peri-Wound Care Wound #1 Left,Plantar Foot Barrier cream - to periwound Wound Cleansing Wound #1 Left,Plantar Foot May shower with protection. - cast protector Wound #2 Right Metatarsal head second May shower and wash wound with soap and water. Primary Wound Dressing Wound #1 Left,Plantar Foot Calcium Alginate with Silver - cut to fit wound bed Wound #2 Right Metatarsal head second Calcium Alginate with Silver - pack into wound bed Secondary Dressing Wound #1 Left,Plantar Foot ABD pad Drawtex - cut to fit inside wound margins Carboflex Wound #2 Right Metatarsal head second Kerlix/Rolled Gauze Dry Gauze Other: - felt callous pad Edema Control Patient to wear own compression stockings - daily to right leg Avoid standing for long periods of time Elevate legs to the level of the heart or above for 30 minutes daily and/or when sitting, a frequency of: Off-Loading Total Contact Cast to Left Lower Extremity - foam padding between left great and 2nd toe Open toe surgical shoe to: - right foot daily Electronic Signature(s) Signed: 11/16/2019 5:49:40 PM By: Cherylin Mylar Signed: 11/16/2019 5:55:42 PM By: Baltazar Najjar MD Entered By: Cherylin Mylar on 11/16/2019 15:37:21 -------------------------------------------------------------------------------- Problem List Details Patient Name: Date of Service: KAYMAN, SNUFFER 11/16/2019 2:15 PM Medical Record [id number] Patient Account Number: [id number] Date of Birth/Sex:  Treating RN: 02/25/1961 (59 y.o. M) Primary Care Provider: Cheryll CockayneBurns, Stacy Other Clinician: Referring Provider: Treating Provider/Extender:Phu Record, Zebedee IbaMichael Burns, Duanne LimerickStacy Weeks in Treatment: 2 Active  Problems ICD-10 Evaluated Encounter Code Description Active Date Today Diagnosis E11.622 Type 2 diabetes mellitus with other skin ulcer 10/31/2019 No Yes L97.522 Non-pressure chronic ulcer of other part of left foot 10/31/2019 No Yes with fat layer exposed L97.512 Non-pressure chronic ulcer of other part of right foot 10/31/2019 No Yes with fat layer exposed I10 Essential (primary) hypertension 10/31/2019 No Yes I50.42 Chronic combined systolic (congestive) and diastolic 10/31/2019 No Yes (congestive) heart failure I87.2 Venous insufficiency (chronic) (peripheral) 10/31/2019 No Yes L84 Corns and callosities 11/14/2019 No Yes Inactive Problems Resolved Problems Electronic Signature(s) Signed: 11/16/2019 5:55:42 PM By: Baltazar Najjarobson, Jeremi Losito MD Entered By: Baltazar Najjarobson, Tameah Mihalko on 11/16/2019 16:17:04 -------------------------------------------------------------------------------- Progress Note Details Patient Name: Date of Service: Fanny BienMILLER, Chael 11/16/2019 2:15 PM Medical Record [id number] Patient Account Number: [id number] Date of Birth/Sex: Treating RN: 01/13/1961 (59 y.o. M) Primary Care Provider: Cheryll CockayneBurns, Stacy Other Clinician: Referring Provider: Treating Provider/Extender:Zurii Hewes, Zebedee IbaMichael Burns, Duanne LimerickStacy Weeks in Treatment: 2 Subjective History of Present Illness (HPI) 10/31/2019 upon evaluation today patient appears to be doing somewhat poorly in regard to his bilateral plantar feet. He has wounds that he tells me have been present since 2012 intermittently off and on. Most recently this has been open for at least the past 6 months to a year. He has been trying to treat this in different ways using Santyl along with various other dressings including Medihoney and even at one point Xeroform. Nothing really has seem to get this completely closed. He was recently in the hospital for cellulitis of his leg subsequently he did have x-rays as well as MRIs that showed negative for any signs of  osteomyelitis in regard to the wounds on his feet. Fortunately there is no signs of systemic infection at this time. No fevers, chills, nausea, vomiting, or diarrhea. Patient has previously used Darco offloading shoes as far as frontal floaters as well as postop surgical shoes. He has never been in a total contact cast that may be something we need to strongly consider here. Patient's most recent hemoglobin A1c 1 month ago was 5.3 seems to be very well controlled which is great. Subsequently he has seen vascular as well as podiatry. His ABIs are 1.07 on the left and 1.14 on the right he seems to be doing well he does have chronic venous stasis. 11/07/2019 upon evaluation today patient appears to be doing well with regard to his wounds all things considered. I do not see any severe worsening he still has some callus buildup on the right more than the left he notes that he has been probably more active than he should as far as walking is concerned is just very hard to not be active. He knows he needs to be more careful in this regard however. He is willing to give the cast a try at this point although he notes that he is a little nervous about this just with regard to balance although he will be very careful and obviously if he has any trouble he knows to contact the office and let me know. 1/22; patient is in for his obligatory first total contact cast change. Our intake nurse reported a very large amount of drainage which is spelled out over to the surrounding skin. Has bilateral diabetic foot wounds. He has Charcot feet. We have been using silver alginate on his wounds. 11/14/2019 on evaluation today  patient is actually seeming to make good improvement in regard to his bilateral plantar foot wounds. We have been using a cast on the left side and on the right side he has been using dressings he is changing up his own accord. With that being said he tells me that he is also not walking as much just  due to how unsteady he feels. He takes it easy when he does have to walk and when he does not have to walk he is resting. This is probably help in his right foot as well has the left foot which is actually measuring better. In fact both are measuring better. Overall I am very pleased with how things seem to be progressing. The patient does have some odor on the left foot this does have me concerned about the possibility of infection, and actually probably go ahead and put him on antibiotics today as well as utilizing a continuation of the cast on the left foot I think that will be fine we probably just need to bring him in sooner to change this not last a whole week. 1/29; we brought the patient back today for a total contact cast change on the left out of concern for excessive drainage. We are using drawtex over the wound as the primary dressing Objective Constitutional Sitting or standing Blood Pressure is within target range for patient.. Pulse regular and within target range for patient.Marland Kitchen Respirations regular, non-labored and within target range.. Temperature is normal and within the target range for the patient.Marland Kitchen Appears in no distress. Vitals Time Taken: 2:59 PM, Height: 73 in, Weight: 260 lbs, BMI: 34.3, Temperature: 98.2 F, Pulse: 86 bpm, Respiratory Rate: 18 breaths/min, Blood Pressure: 124/83 mmHg. General Notes: Wound exam; the area on the left lateral foot looked quite good. There is no evidence of excessive drainage at the moment no skin maceration. The wound was redressed and a total contact cast applied in the standard fashion. No debridement was required ooThe area on the right foot also looks stable although it has some depth. No change in the dressing here I did Integumentary (Hair, Skin) Wound #1 status is Open. Original cause of wound was Gradually Appeared. The wound is located on the Left,Plantar Foot. The wound measures 3cm length x 3.5cm width x 0.2cm depth; 8.247cm^2  area and 1.649cm^3 volume. There is Fat Layer (Subcutaneous Tissue) Exposed exposed. There is no tunneling or undermining noted. There is a large amount of serosanguineous drainage noted. The wound margin is flat and intact. There is medium (34-66%) pink granulation within the wound bed. There is a medium (34-66%) amount of necrotic tissue within the wound bed including Adherent Slough. Wound #2 status is Open. Original cause of wound was Gradually Appeared. The wound is located on the Right Metatarsal head second. The wound measures 2.3cm length x 1.3cm width x 1cm depth; 2.348cm^2 area and 2.348cm^3 volume. There is Fat Layer (Subcutaneous Tissue) Exposed exposed. There is no tunneling or undermining noted. There is a medium amount of serosanguineous drainage noted. The wound margin is flat and intact. There is large (67-100%) pink granulation within the wound bed. There is a small (1-33%) amount of necrotic tissue within the wound bed including Adherent Slough. Assessment Active Problems ICD-10 Type 2 diabetes mellitus with other skin ulcer Non-pressure chronic ulcer of other part of left foot with fat layer exposed Non-pressure chronic ulcer of other part of right foot with fat layer exposed Essential (primary) hypertension Chronic combined systolic (congestive) and diastolic (  congestive) heart failure Venous insufficiency (chronic) (peripheral) Corns and callosities Procedures Wound #1 Pre-procedure diagnosis of Wound #1 is a Diabetic Wound/Ulcer of the Lower Extremity located on the Left,Plantar Foot . There was a Total Contact Cast Procedure by Maxwell Caul., MD. Post procedure Diagnosis Wound #1: Same as Pre-Procedure Plan Follow-up Appointments: Return Appointment in 1 week. - Wed Return Appointment in: - Friday for cast change Dressing Change Frequency: Wound #1 Left,Plantar Foot: Do not change entire dressing for one week. Wound #2 Right Metatarsal head  second: Change Dressing every other day. Skin Barriers/Peri-Wound Care: Wound #1 Left,Plantar Foot: Barrier cream - to periwound Wound Cleansing: Wound #1 Left,Plantar Foot: May shower with protection. - cast protector Wound #2 Right Metatarsal head second: May shower and wash wound with soap and water. Primary Wound Dressing: Wound #1 Left,Plantar Foot: Calcium Alginate with Silver - cut to fit wound bed Wound #2 Right Metatarsal head second: Calcium Alginate with Silver - pack into wound bed Secondary Dressing: Wound #1 Left,Plantar Foot: ABD pad Drawtex - cut to fit inside wound margins Carboflex Wound #2 Right Metatarsal head second: Kerlix/Rolled Gauze Dry Gauze Other: - felt callous pad Edema Control: Patient to wear own compression stockings - daily to right leg Avoid standing for long periods of time Elevate legs to the level of the heart or above for 30 minutes daily and/or when sitting, a frequency of: Off-Loading: Total Contact Cast to Left Lower Extremity - foam padding between left great and 2nd toe Open toe surgical shoe to: - right foot daily 1. Silver alginate/drawtex total contact cast reapplied 2. We have reapplied silver alginate-based dressing on the right as well Electronic Signature(s) Signed: 11/16/2019 5:55:42 PM By: Baltazar Najjar MD Entered By: Baltazar Najjar on 11/16/2019 16:20:33 -------------------------------------------------------------------------------- Total Contact Cast Details Patient Name: Date of Service: STEPHANE, NIEMANN 11/16/2019 2:15 PM Medical Record [id number] Patient Account Number: [id number] Date of Birth/Sex: Treating RN: 08-19-1961 (59 y.o. M) Primary Care Provider: [name] Other Clinician: Referring Provider: Treating Provider/Extender:Modell Fendrick, Zebedee Iba, Duanne Limerick in Treatment: 2 Total Contact Cast Applied for Wound Assessment: Wound #1 Left,Plantar Foot Performed By: Physician Maxwell Caul., MD Post  Procedure Diagnosis Same as Pre-procedure Electronic Signature(s) Signed: 11/16/2019 5:55:42 PM By: Baltazar Najjar MD Entered By: Baltazar Najjar on 11/16/2019 16:17:19 -------------------------------------------------------------------------------- SuperBill Details Patient Name: Date of Service: NEO, YEPIZ 11/16/2019 Medical Record [id number] Patient Account Number: [id number] Date of Birth/Sex: Treating RN: 01/24/1961 (59 y.o. Katherina Right Primary Care Provider: [name] Other Clinician: Referring Provider: Treating Provider/Extender:Dannel Rafter, Zebedee Iba, Duanne Limerick in Treatment: 2 Diagnosis Coding ICD-10 Codes Code Description E11.622 Type 2 diabetes mellitus with other skin ulcer L97.522 Non-pressure chronic ulcer of other part of left foot with fat layer exposed L97.512 Non-pressure chronic ulcer of other part of right foot with fat layer exposed I10 Essential (primary) hypertension I50.42 Chronic combined systolic (congestive) and diastolic (congestive) heart failure I87.2 Venous insufficiency (chronic) (peripheral) L84 Corns and callosities Facility Procedures CPT4 Code Description: [id number] - APPLY TOTAL CONTACT LEG CAST ICD-10 Diagnosis Description L97.522 Non-pressure chronic ulcer of other part of left foot with Modifier: fat layer exp Quantity: 1 osed Physician Procedures CPT4 Code Description: [id number] - WC PHYS APPLY TOTAL CONTACT CAST ICD-10 Diagnosis Description L97.522 Non-pressure chronic ulcer of other part of left foot with Modifier: fat layer exp Quantity: 1 osed Electronic Signature(s) Signed: 11/16/2019 5:55:42 PM By: Baltazar Najjar MD Entered By: Baltazar Najjar on 11/16/2019 16:20:43

## 2019-11-18 LAB — AEROBIC CULTURE W GRAM STAIN (SUPERFICIAL SPECIMEN): Gram Stain: NONE SEEN

## 2019-11-20 NOTE — Progress Notes (Signed)
[name], [name] ([id number]) Visit Report for 11/16/2019 Arrival Information Details Patient Name: Date of Service: [name], [name] 11/16/2019 2:15 PM Medical Record [id number] Patient Account Number: [id number] Date of Birth/Sex: Treating RN: Mar 04, 1961 (58 y.o. [name]) Yevonne Pax Primary Care Daniell Paradise: [name] Other Clinician: Referring Heidy Mccubbin: Treating Tempie Gibeault/Extender:Robson, Zebedee Iba, Duanne Limerick in Treatment: 2 Visit Information History Since Last Visit All ordered tests and consults were completed: No Patient Arrived: Ambulatory Added or deleted any medications: No Arrival Time: 14:58 Any new allergies or adverse reactions: No Accompanied By: self Had a fall or experienced change in No Transfer Assistance: None activities of daily living that may affect Patient Identification Verified: Yes risk of falls: Secondary Verification Process Yes Signs or symptoms of abuse/neglect since last No Completed: visito Patient Requires Transmission-Based No Hospitalized since last visit: No Precautions: Implantable device outside of the clinic excluding No Patient Has Alerts: Yes cellular tissue based products placed in the center Patient Alerts: L ABI 1.07 since last visit: (04/2019) Has Dressing in Place as Prescribed: Yes R ABI 1.14 Has Compression in Place as Prescribed: Yes (04/2019) Pain Present Now: No Electronic Signature(s) Signed: 11/20/2019 5:23:18 PM By: Yevonne Pax RN Entered By: Yevonne Pax on 11/16/2019 14:59:18 -------------------------------------------------------------------------------- Lower Extremity Assessment Details Patient Name: Date of Service: [name], [name] 11/16/2019 2:15 PM Medical Record [id number] Patient Account Number: [id number] Date of Birth/Sex: Treating RN: 1961/03/05 (58 y.o. [name] Primary Care Cherine Drumgoole: [name] Other Clinician: Referring Arlie Posch: Treating Dominick Zertuche/Extender:Robson, Zebedee Iba,  Duanne Limerick in Treatment: 2 Edema Assessment Assessed: [Left: No] [[name]: No] Edema: [Left: Yes] [[name]: Yes] Calf Left: [name]: Point of Measurement: 30 cm From Medial Instep 43.5 cm 41 cm Ankle Left: [name]: Point of Measurement: 11 cm From Medial Instep 23 cm 28 cm Electronic Signature(s) Signed: 11/20/2019 5:23:18 PM By: Yevonne Pax RN Entered By: Yevonne Pax on 11/16/2019 15:00:32 -------------------------------------------------------------------------------- Multi Wound Chart Details Patient Name: Date of Service: [name], [name] 11/16/2019 2:15 PM Medical Record [id number] Patient Account Number: [id number] Date of Birth/Sex: Treating RN: 01-12-1961 (59 y.o. 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] [2:Lower Extremity] Comorbid History: [1:Anemia, Sleep Apnea, Congestive Heart Failure, Congestive Heart Failure, Hypertension, Peripheral Hypertension, Peripheral Venous Disease, Cirrhosis , Venous Disease, Cirrhosis , Type II Diabetes, Osteoarthritis, Neuropathy  Osteoarthritis, Neuropathy] [2:Anemia, Sleep Apnea, Type II Diabetes,] [N/A:N/A] Date Acquired: [1:02/16/2019] [2:02/16/2019] [N/A:N/A] Weeks of Treatment: [1:2] [2:2] [N/A:N/A] Wound Status: [1:Open] [2:Open] [N/A:N/A] Measurements L x W x D 3x3.5x0.2 [2:2.3x1.3x1] [N/A:N/A] (cm) Area (cm) : [1:8.247] [2:2.348] [N/A:N/A] Volume (cm) :  [1:1.649] [2:2.348] [N/A:N/A] % Reduction in Area: [1:7.90%] [2:-6.80%] [N/A:N/A] % Reduction in Volume: [1:73.70%] [2:11.00%] [N/A:N/A] Classification: [1:Grade 2] [2:Grade 2] [N/A:N/A] Exudate Amount: [1:Large] [2:Medium] [N/A:N/A] Exudate Type: [1:Serosanguineous] [2:Serosanguineous] [N/A:N/A] Exudate Color: [1:red, brown] [2:red, brown] [N/A:N/A] Wound Margin: [1:Flat and Intact] [2:Flat and Intact] [N/A:N/A] Granulation Amount: [1:Medium (34-66%)] [2:Large (67-100%)] [N/A:N/A] Granulation Quality: [1:Pink] [2:Pink] [N/A:N/A] Necrotic Amount: [1:Medium (34-66%)] [2:Small (1-33%)] [N/A:N/A] Exposed Structures: [1:Fat Layer (Subcutaneous Tissue) Exposed: Yes Fascia: No Tendon: No Muscle: No Joint: No Bone: No] [2:Fat Layer (Subcutaneous Tissue) Exposed: Yes Fascia: No Tendon: No Muscle: No Joint:  No Bone: No] [N/A:N/A] Epithelialization: [1:Small (1-33%) Total Contact Cast] [2:Small (1-33%) N/A] [N/A:N/A N/A] Treatment Notes Electronic Signature(s) Signed: 11/16/2019 5:55:42 PM By: Baltazar Najjar MD Entered By: Baltazar Najjar on 11/16/2019 16:17:10 -------------------------------------------------------------------------------- Multi-Disciplinary Care Plan Details Patient Name: Date of Service: [name], [name] 11/16/2019 2:15 PM Medical Record [id number] Patient Account Number: [id number] Date of Birth/Sex: Treating RN: [birth year] (59 y.o. [name] Primary Care Brynnly Bonet: [name] Other Clinician: Referring Mete Purdum: Treating Kerrigan Gombos/Extender:Robson, Zebedee Iba, Duanne Limerick in Treatment: 2 Active Inactive Nutrition Nursing Diagnoses: Potential for alteratiion in Nutrition/Potential for imbalanced nutrition Goals: Patient/caregiver will maintain therapeutic glucose control Date Initiated: 10/31/2019 Target Resolution Date: 11/28/2019 Goal Status: Active Interventions: Assess HgA1c results as ordered upon admission and as needed Provide education on  elevated blood sugars and impact on wound healing Treatment Activities: Patient referred to Primary Care Physician for further nutritional evaluation : 10/31/2019 Notes: Peripheral Neuropathy Nursing Diagnoses: Knowledge deficit related to disease process and management of peripheral neurovascular dysfunction Goals: Patient/caregiver will verbalize understanding of disease process and disease management Date Initiated: 10/31/2019 Target Resolution Date: 11/28/2019 Goal Status: Active Interventions: Assess signs and symptoms of neuropathy upon admission and as needed Provide education on Management of Neuropathy and Related Ulcers Treatment Activities: Patient referred for customized footwear/offloading : 10/31/2019 Notes: Wound/Skin Impairment Nursing Diagnoses: Impaired tissue integrity Knowledge deficit related to ulceration/compromised skin integrity Goals: Patient/caregiver will verbalize understanding of skin care regimen Date Initiated: 10/31/2019 Target Resolution Date: 12/26/2019 Goal Status: Active Ulcer/skin breakdown will have a volume reduction of 30% by week 4 Date Initiated: 10/31/2019 Target Resolution Date: 11/28/2019 Goal Status: Active Interventions: Assess patient/caregiver ability to obtain necessary supplies Assess patient/caregiver ability to perform ulcer/skin care regimen upon admission and as needed Assess ulceration(s) every visit Provide education on ulcer and skin care Treatment Activities: Skin care regimen initiated : 10/31/2019 Topical wound management initiated : 10/31/2019 Notes: Electronic Signature(s) Signed: 11/16/2019 5:49:40 PM By: Cherylin Mylar Entered By: Cherylin Mylar on 11/16/2019 15:48:17 -------------------------------------------------------------------------------- Pain Assessment Details Patient Name: Date of Service: [name], [name] 11/16/2019 2:15 PM Medical Record [id number] Patient Account Number: [id number] Date of  Birth/Sex: Treating RN: 1961/01/07 (58 y.o. [name] Primary Care Tiffaney Heimann: [name] Other Clinician: Referring Gladyce Mcray: Treating Esparanza Krider/Extender:Robson, Zebedee Iba, Duanne Limerick in Treatment: 2 Active Problems Location of Pain Severity and Description of Pain Patient Has Paino No Site Locations Pain Management and Medication Current Pain Management: Electronic Signature(s) Signed: 11/20/2019 5:23:18 PM By: Yevonne Pax RN Entered By: Yevonne Pax on 11/16/2019 15:00:22 -------------------------------------------------------------------------------- Patient/Caregiver Education Details Patient Name: Date of Service: [name], [name] 1/29/2021andnbsp2:15 PM Medical Record [phone] Patient Account Number: [id number] Date of Birth/Gender: 1960-12-24 (58 y.o. M) Treating RN: Cherylin Mylar Primary Care Physician: [name] Other Clinician: Referring Physician: Treating Physician/Extender:Robson, Zebedee Iba, Duanne Limerick in Treatment: 2 Education Assessment Education Provided To: Patient Education Topics Provided Elevated Blood Sugar/ Impact on Healing: Methods: Explain/Verbal Responses: State content correctly Peripheral Neuropathy: Methods: Explain/Verbal Responses: State content correctly Wound/Skin Impairment: Methods: Explain/Verbal Responses: State content correctly Electronic Signature(s) Signed: 11/16/2019 5:49:40 PM By: Cherylin Mylar Entered By: Cherylin Mylar on 11/16/2019 15:48:35 -------------------------------------------------------------------------------- Wound Assessment Details Patient Name: Date of Service: [name], [name] 11/16/2019 2:15 PM Medical Record [id number] Patient Account Number: [id number] Date of Birth/Sex: Treating RN: 1961/01/04 (58 y.o. [name] Primary Care Palmer Fahrner: [name] Other Clinician: Referring Cutberto Winfree: Treating Dua Mehler/Extender:Robson, Zebedee Iba, Stacy Weeks in Treatment:  2 Wound Status Wound Number: 1 Primary Diabetic Wound/Ulcer of the Lower Extremity Etiology: Wound Location: Left Foot -  Plantar Wound Open Wounding Event: Gradually Appeared Status: Date Acquired: 02/16/2019 Comorbid Anemia, Sleep Apnea, Congestive Heart Weeks Of Treatment: 2 History: Failure, Hypertension, Peripheral Venous Clustered Wound: No Disease, Cirrhosis , Type II Diabetes, Osteoarthritis, Neuropathy Photos Wound Measurements Length: (cm) 3 Width: (cm) 3.5 Depth: (cm) 0.2 Area: (cm) 8.247 Volume: (cm) 1.649 Wound Description Classification: Grade 2 Wound Margin: Flat and Intact Exudate Amount: Large Exudate Type: Serosanguineous Exudate Color: red, brown Wound Bed Granulation Amount: Medium (34-66%) Granulation Quality: Pink Necrotic Amount: Medium (34-66%) Necrotic Quality: Adherent Slough After Cleansing: No rino Yes Exposed Structure sed: No Subcutaneous Tissue) Exposed: Yes sed: No sed: No ed: No d: No % Reduction in Area: 7.9% % Reduction in Volume: 73.7% Epithelialization: Small (1-33%) Tunneling: No Undermining: No Foul Odor Slough/Fib Fascia Expo Fat Layer ( Tendon Expo Muscle Expo Joint Expos Bone Expose Electronic Signature(s) Signed: 11/20/2019 5:19:10 PM By: Mikeal Hawthorne EMT/HBOT Signed: 11/20/2019 5:23:18 PM By: Carlene Coria RN Entered By: Mikeal Hawthorne on 11/20/2019 14:30:56 -------------------------------------------------------------------------------- Wound Assessment Details Patient Name: Date of Service: [name], [name] 11/16/2019 2:15 PM Medical Record [id number] Patient Account Number: [id number] Date of Birth/Sex: Treating RN: [birth year] (58 y.o. Jerilynn Mages) Carlene Coria Primary Care Bryelle Spiewak: Billey Gosling Other Clinician: Referring Rayola Everhart: Treating Zully Frane/Extender:Robson, Baird Lyons, Nadine Counts in Treatment: 2 Wound Status Wound Number: 2 Primary Diabetic Wound/Ulcer of the Lower Extremity Etiology: Wound  Location: [name] Metatarsal head second Wound Open Wounding Event: Gradually Appeared Status: Date Acquired: 02/16/2019 Comorbid Anemia, Sleep Apnea, Congestive Heart Weeks Of Treatment: 2 History: Failure, Hypertension, Peripheral Venous Clustered Wound: No Disease, Cirrhosis , Type II Diabetes, Osteoarthritis, Neuropathy Photos Wound Measurements Length: (cm) 2.3 Width: (cm) 1.3 Depth: (cm) 1 Area: (cm) 2.348 Volume: (cm) 2.348 Wound Description Classification: Grade 2 Wound Margin: Flat and Intact Exudate Amount: Medium Exudate Type: Serosanguineous Exudate Color: red, brown Wound Bed Granulation Amount: Large (67-100%) Granulation Quality: Pink Necrotic Amount: Small (1-33%) Necrotic Quality: Adherent Slough fter Cleansing: No ino Yes Exposed Structure ed: No ubcutaneous Tissue) Exposed: Yes ed: No ed: No d: No : No % Reduction in Area: -6.8% % Reduction in Volume: 11% Epithelialization: Small (1-33%) Tunneling: No Undermining: No Foul Odor A Slough/Fibr Fascia Expos Fat Layer (S Tendon Expos Muscle Expos Joint Expose Bone Exposed Electronic Signature(s) Signed: 11/20/2019 5:19:10 PM By: Mikeal Hawthorne EMT/HBOT Signed: 11/20/2019 5:23:18 PM By: Carlene Coria RN Entered By: Mikeal Hawthorne on 11/20/2019 14:30:35 -------------------------------------------------------------------------------- Vitals Details Patient Name: Date of Service: [name], [name] 11/16/2019 2:15 PM Medical Record [id number] Patient Account Number: [id number] Date of Birth/Sex: Treating RN: [birth year] (58 y.o. Jerilynn Mages) Dolores Lory, Morey Hummingbird Primary Care Alice Burnside: Billey Gosling Other Clinician: Referring Tarrence Enck: Treating Dragan Tamburrino/Extender:Robson, Baird Lyons, Nadine Counts in Treatment: 2 Vital Signs Time Taken: 14:59 Temperature (F): 98.2 Height (in): 73 Pulse (bpm): 86 Weight (lbs): 260 Respiratory Rate (breaths/min): 18 Body Mass Index (BMI): 34.3 Blood Pressure (mmHg):  124/83 Reference Range: 80 - 120 mg / dl Electronic Signature(s) Signed: 11/20/2019 5:23:18 PM By: Carlene Coria RN Entered By: Carlene Coria on 11/16/2019 15:00:13

## 2019-11-21 ENCOUNTER — Encounter (HOSPITAL_BASED_OUTPATIENT_CLINIC_OR_DEPARTMENT_OTHER): Payer: Medicare (Managed Care) | Admitting: Physician Assistant

## 2019-11-21 ENCOUNTER — Other Ambulatory Visit: Payer: Self-pay

## 2019-11-21 DIAGNOSIS — I5042 Chronic combined systolic (congestive) and diastolic (congestive) heart failure: Secondary | ICD-10-CM | POA: Insufficient documentation

## 2019-11-21 DIAGNOSIS — E1151 Type 2 diabetes mellitus with diabetic peripheral angiopathy without gangrene: Secondary | ICD-10-CM | POA: Insufficient documentation

## 2019-11-21 DIAGNOSIS — L97512 Non-pressure chronic ulcer of other part of right foot with fat layer exposed: Secondary | ICD-10-CM | POA: Diagnosis not present

## 2019-11-21 DIAGNOSIS — E11622 Type 2 diabetes mellitus with other skin ulcer: Secondary | ICD-10-CM | POA: Diagnosis not present

## 2019-11-21 DIAGNOSIS — E11621 Type 2 diabetes mellitus with foot ulcer: Secondary | ICD-10-CM | POA: Insufficient documentation

## 2019-11-21 DIAGNOSIS — I872 Venous insufficiency (chronic) (peripheral): Secondary | ICD-10-CM | POA: Insufficient documentation

## 2019-11-21 DIAGNOSIS — L84 Corns and callosities: Secondary | ICD-10-CM | POA: Diagnosis not present

## 2019-11-21 DIAGNOSIS — G4730 Sleep apnea, unspecified: Secondary | ICD-10-CM | POA: Insufficient documentation

## 2019-11-21 DIAGNOSIS — E1140 Type 2 diabetes mellitus with diabetic neuropathy, unspecified: Secondary | ICD-10-CM | POA: Diagnosis not present

## 2019-11-21 DIAGNOSIS — M1990 Unspecified osteoarthritis, unspecified site: Secondary | ICD-10-CM | POA: Insufficient documentation

## 2019-11-21 DIAGNOSIS — K7460 Unspecified cirrhosis of liver: Secondary | ICD-10-CM | POA: Insufficient documentation

## 2019-11-21 DIAGNOSIS — I110 Hypertensive heart disease with heart failure: Secondary | ICD-10-CM | POA: Insufficient documentation

## 2019-11-21 DIAGNOSIS — D649 Anemia, unspecified: Secondary | ICD-10-CM | POA: Insufficient documentation

## 2019-11-21 DIAGNOSIS — L97522 Non-pressure chronic ulcer of other part of left foot with fat layer exposed: Secondary | ICD-10-CM | POA: Diagnosis not present

## 2019-11-21 NOTE — Progress Notes (Addendum)
HEITH, HAIGLER ([id number]) Visit Report for 11/21/2019 Chief Complaint Document Details Patient Name: Date of Service: [name], [name] 11/21/2019 2:30 PM Medical Record [id number] Patient Account Number: [id number] Date of Birth/Sex: Treating RN: 1961-03-29 (59 y.o. M) Primary Care Provider: [name] Other Clinician: Referring Provider: Treating Provider/Extender:Stone III, Dayton Martes, Stacy Weeks in Treatment: 3 Information Obtained from: Patient Chief Complaint Bilateral foot diabetic ulcers Electronic Signature(s) Signed: 11/21/2019 3:53:18 PM By: Lenda Kelp PA-C Entered By: Lenda Kelp on 11/21/2019 15:53:18 -------------------------------------------------------------------------------- Debridement Details Patient Name: Date of Service: [name], [name] 11/21/2019 2:30 PM Medical Record [id number] Patient Account Number: [id number] Date of Birth/Sex: Treating RN: 1960-12-01 (59 y.o. [name] Primary Care Provider: [name] Other Clinician: Referring Provider: Treating Provider/Extender:Stone III, Dayton Martes, Stacy Weeks in Treatment: 3 Debridement Performed for Wound #2 Right Metatarsal head second Assessment: Performed By: Physician Lenda Kelp, PA Debridement Type: Debridement Severity of Tissue Pre Fat layer exposed Debridement: Level of Consciousness (Pre- Awake and Alert procedure): Pre-procedure Verification/Time Out Taken: Yes - 16:00 Start Time: 16:02 Pain Control: Lidocaine 4% Topical Solution Total Area Debrided (L x W): 3 (cm) x 2 (cm) = 6 (cm) Tissue and other material Viable, Non-Viable, Callus, Slough, Subcutaneous, Slough debrided: Level: Skin/Subcutaneous Tissue Debridement Description: Excisional Instrument: Curette Bleeding: Minimum Hemostasis Achieved: Pressure End Time: 16:03 Procedural Pain: 0 Post Procedural Pain: 0 Response to Treatment: Procedure was tolerated well Level of Consciousness Awake and  Alert (Post-procedure): Post Debridement Measurements of Total Wound Length: (cm) 2.4 Width: (cm) 1.9 Depth: (cm) 0.8 Volume: (cm) 2.865 Character of Wound/Ulcer Post Improved Debridement: Severity of Tissue Post Debridement: Fat layer exposed Post Procedure Diagnosis Same as Pre-procedure Electronic Signature(s) Signed: 11/21/2019 6:36:45 PM By: Zenaida Deed RN, BSN Signed: 11/21/2019 7:52:58 PM By: Lenda Kelp PA-C Entered By: Zenaida Deed on 11/21/2019 16:04:09 -------------------------------------------------------------------------------- HPI Details Patient Name: Date of Service: [name], [name] 11/21/2019 2:30 PM Medical Record [id number] Patient Account Number: [id number] Date of Birth/Sex: Treating RN: 01-Jan-1961 (59 y.o. M) Primary Care Provider: [name] Other Clinician: Referring Provider: Treating Provider/Extender:Stone III, Dayton Martes, Stacy Weeks in Treatment: 3 History of Present Illness HPI Description: 10/31/2019 upon evaluation today patient appears to be doing somewhat poorly in regard to his bilateral plantar feet. He has wounds that he tells me have been present since 2012 intermittently off and on. Most recently this has been open for at least the past 6 months to a year. He has been trying to treat this in different ways using Santyl along with various other dressings including Medihoney and even at one point Xeroform. Nothing really has seem to get this completely closed. He was recently in the hospital for cellulitis of his leg subsequently he did have x-rays as well as MRIs that showed negative for any signs of osteomyelitis in regard to the wounds on his feet. Fortunately there is no signs of systemic infection at this time. No fevers, chills, nausea, vomiting, or diarrhea. Patient has previously used Darco offloading shoes as far as frontal floaters as well as postop surgical shoes. He has never been in a total contact cast that may be  something we need to strongly consider here. Patient's most recent hemoglobin A1c 1 month ago was 5.3 seems to be very well controlled which is great. Subsequently he has seen vascular as well as podiatry. His ABIs are 1.07 on the left and 1.14 on the right he seems to be doing well he does have chronic venous stasis. 11/07/2019 upon evaluation  today patient appears to be doing well with regard to his wounds all things considered. I do not see any severe worsening he still has some callus buildup on the right more than the left he notes that he has been probably more active than he should as far as walking is concerned is just very hard to not be active. He knows he needs to be more careful in this regard however. He is willing to give the cast a try at this point although he notes that he is a little nervous about this just with regard to balance although he will be very careful and obviously if he has any trouble he knows to contact the office and let me know. 1/22; patient is in for his obligatory first total contact cast change. Our intake nurse reported a very large amount of drainage which is spelled out over to the surrounding skin. Has bilateral diabetic foot wounds. He has Charcot feet. We have been using silver alginate on his wounds. 11/14/2019 on evaluation today patient is actually seeming to make good improvement in regard to his bilateral plantar foot wounds. We have been using a cast on the left side and on the right side he has been using dressings he is changing up his own accord. With that being said he tells me that he is also not walking as much just due to how unsteady he feels. He takes it easy when he does have to walk and when he does not have to walk he is resting. This is probably help in his right foot as well has the left foot which is actually measuring better. In fact both are measuring better. Overall I am very pleased with how things seem to be progressing. The  patient does have some odor on the left foot this does have me concerned about the possibility of infection, and actually probably go ahead and put him on antibiotics today as well as utilizing a continuation of the cast on the left foot I think that will be fine we probably just need to bring him in sooner to change this not last a whole week. 1/29; we brought the patient back today for a total contact cast change on the left out of concern for excessive drainage. We are using drawtex over the wound as the primary dressing 11/21/2019 on evaluation today patient appears to be doing well with regard to his left plantar foot. In fact both foot ulcers actually seem to be doing pretty well. Nonetheless he is having a lot of drainage on the left at this time and again we did obtain a wound culture did show positive for Staph aureus that was reviewed by myself today as well. Nonetheless he is on Bactrim which was shown to be sensitive that should be helping in this regard. Fortunately there is no signs of infection systemically at this point. Electronic Signature(s) Signed: 11/21/2019 4:08:36 PM By: Lenda Kelp PA-C Entered By: Lenda Kelp on 11/21/2019 16:08:36 -------------------------------------------------------------------------------- Physical Exam Details Patient Name: Date of Service: [name], [name] 11/21/2019 2:30 PM Medical Record [id number] Patient Account Number: [id number] Date of Birth/Sex: Treating RN: [birth year] (59 y.o. M) Primary Care Provider: [name] Other Clinician: Referring Provider: Treating Provider/Extender:Stone III, Dayton Martes, Stacy Weeks in Treatment: 3 Constitutional Well-nourished and well-hydrated in no acute distress. Respiratory normal breathing without difficulty. Psychiatric this patient is able to make decisions and demonstrates good insight into disease process. Alert and Oriented x 3. pleasant and cooperative. Notes  Patient's wound bed  currently showed signs of good granulation at this time. On the left I was able to mechanically debride away the slough with saline and gauze. On the right I did have to perform some sharp debridement to remove some callus and necrotic debris from the surface of the wound which he tolerated today without complication post debridement the wound bed appears to be doing much better. We did subsequently reapply total contact cast to the left lower extremity today which I applied myself. Electronic Signature(s) Signed: 11/21/2019 4:09:08 PM By: Lenda KelpStone III, Cordarryl Monrreal PA-C Entered By: Lenda KelpStone III, Sada Mazzoni on 11/21/2019 16:09:08 -------------------------------------------------------------------------------- Physician Orders Details Patient Name: Date of Service: [name], Tay[place] 11/21/2019 2:30 PM Medical Record [id number] Patient Account Number: [id number] Date of Birth/Sex: Treating RN: 02/03/1961 (59 y.o. Damaris SchoonerM) Boehlein, Linda Primary Care Provider: Cheryll CockayneBurns, Stacy Other Clinician: Referring Provider: Treating Provider/Extender:Stone III, Dayton MartesHoyt Burns, Duanne LimerickStacy Weeks in Treatment: 3 Verbal / Phone Orders: No Diagnosis Coding ICD-10 Coding Code Description E11.622 Type 2 diabetes mellitus with other skin ulcer L97.522 Non-pressure chronic ulcer of other part of left foot with fat layer exposed L97.512 Non-pressure chronic ulcer of other part of right foot with fat layer exposed I10 Essential (primary) hypertension I50.42 Chronic combined systolic (congestive) and diastolic (congestive) heart failure I87.2 Venous insufficiency (chronic) (peripheral) L84 Corns and callosities Follow-up Appointments Return Appointment in 1 week. - Wed Return Appointment in: - Friday for cast change Dressing Change Frequency Wound #1 Left,Plantar Foot Do not change entire dressing for one week. Wound #2 Right Metatarsal head second Change Dressing every other day. Skin Barriers/Peri-Wound Care Wound #1 Left,Plantar  Foot Barrier cream - to periwound Wound Cleansing Wound #1 Left,Plantar Foot May shower with protection. - cast protector Wound #2 Right Metatarsal head second May shower and wash wound with soap and water. Primary Wound Dressing Wound #1 Left,Plantar Foot Hydrofera Blue - classic cut to fit inside wound margins Wound #2 Right Metatarsal head second Calcium Alginate with Silver - pack into wound bed Secondary Dressing Wound #1 Left,Plantar Foot ABD pad Drawtex - cut to fit inside wound margins Carboflex Wound #2 Right Metatarsal head second Kerlix/Rolled Gauze Dry Gauze Other: - felt callous pad Edema Control Patient to wear own compression stockings - daily to right leg Avoid standing for long periods of time Elevate legs to the level of the heart or above for 30 minutes daily and/or when sitting, a frequency of: Off-Loading Total Contact Cast to Left Lower Extremity - foam padding between left great and 2nd toe Open toe surgical shoe to: - right foot daily Electronic Signature(s) Signed: 11/21/2019 6:36:45 PM By: Zenaida DeedBoehlein, Linda RN, BSN Signed: 11/21/2019 7:52:58 PM By: Lenda KelpStone III, Lisa Milian PA-C Entered By: Zenaida DeedBoehlein, Linda on 11/21/2019 16:07:43 -------------------------------------------------------------------------------- Problem List Details Patient Name: Date of Service: [name], [name] 11/21/2019 2:30 PM Medical Record [id number] Patient Account Number: [id number] Date of Birth/Sex: Treating RN: 03/31/1961 (59 y.o. M) Primary Care Provider: Cheryll CockayneBurns, Stacy Other Clinician: Referring Provider: Treating Provider/Extender:Stone III, Dayton MartesHoyt Burns, Stacy Weeks in Treatment: 3 Active Problems ICD-10 Evaluated Encounter Code Description Active Date Today Diagnosis E11.622 Type 2 diabetes mellitus with other skin ulcer 10/31/2019 No Yes L97.522 Non-pressure chronic ulcer of other part of left foot 10/31/2019 No Yes with fat layer exposed L97.512 Non-pressure chronic ulcer of  other part of right foot 10/31/2019 No Yes with fat layer exposed I10 Essential (primary) hypertension 10/31/2019 No Yes I50.42 Chronic combined systolic (congestive) and diastolic 10/31/2019 No Yes (congestive) heart failure I87.2 Venous insufficiency (chronic) (  peripheral) 10/31/2019 No Yes L84 Corns and callosities 11/14/2019 No Yes Inactive Problems Resolved Problems Electronic Signature(s) Signed: 11/21/2019 3:53:09 PM By: Lenda KelpStone III, Jayah Balthazar PA-C Entered By: Lenda KelpStone III, Suresh Audi on 11/21/2019 15:53:08 -------------------------------------------------------------------------------- Progress Note Details Patient Name: Date of Service: [name], [name] 11/21/2019 2:30 PM Medical Record [id number] Patient Account Number: [id number] Date of Birth/Sex: Treating RN: 03/04/1961 (59 y.o. M) Primary Care Provider: Cheryll CockayneBurns, Stacy Other Clinician: Referring Provider: Treating Provider/Extender:Stone III, Dayton MartesHoyt Burns, Stacy Weeks in Treatment: 3 Subjective Chief Complaint Information obtained from Patient Bilateral foot diabetic ulcers History of Present Illness (HPI) 10/31/2019 upon evaluation today patient appears to be doing somewhat poorly in regard to his bilateral plantar feet. He has wounds that he tells me have been present since 2012 intermittently off and on. Most recently this has been open for at least the past 6 months to a year. He has been trying to treat this in different ways using Santyl along with various other dressings including Medihoney and even at one point Xeroform. Nothing really has seem to get this completely closed. He was recently in the hospital for cellulitis of his leg subsequently he did have x-rays as well as MRIs that showed negative for any signs of osteomyelitis in regard to the wounds on his feet. Fortunately there is no signs of systemic infection at this time. No fevers, chills, nausea, vomiting, or diarrhea. Patient has previously used Darco offloading shoes as far  as frontal floaters as well as postop surgical shoes. He has never been in a total contact cast that may be something we need to strongly consider here. Patient's most recent hemoglobin A1c 1 month ago was 5.3 seems to be very well controlled which is great. Subsequently he has seen vascular as well as podiatry. His ABIs are 1.07 on the left and 1.14 on the right he seems to be doing well he does have chronic venous stasis. 11/07/2019 upon evaluation today patient appears to be doing well with regard to his wounds all things considered. I do not see any severe worsening he still has some callus buildup on the right more than the left he notes that he has been probably more active than he should as far as walking is concerned is just very hard to not be active. He knows he needs to be more careful in this regard however. He is willing to give the cast a try at this point although he notes that he is a little nervous about this just with regard to balance although he will be very careful and obviously if he has any trouble he knows to contact the office and let me know. 1/22; patient is in for his obligatory first total contact cast change. Our intake nurse reported a very large amount of drainage which is spelled out over to the surrounding skin. Has bilateral diabetic foot wounds. He has Charcot feet. We have been using silver alginate on his wounds. 11/14/2019 on evaluation today patient is actually seeming to make good improvement in regard to his bilateral plantar foot wounds. We have been using a cast on the left side and on the right side he has been using dressings he is changing up his own accord. With that being said he tells me that he is also not walking as much just due to how unsteady he feels. He takes it easy when he does have to walk and when he does not have to walk he is resting. This is probably help in his right  foot as well has the left foot which is actually measuring better. In  fact both are measuring better. Overall I am very pleased with how things seem to be progressing. The patient does have some odor on the left foot this does have me concerned about the possibility of infection, and actually probably go ahead and put him on antibiotics today as well as utilizing a continuation of the cast on the left foot I think that will be fine we probably just need to bring him in sooner to change this not last a whole week. 1/29; we brought the patient back today for a total contact cast change on the left out of concern for excessive drainage. We are using drawtex over the wound as the primary dressing 11/21/2019 on evaluation today patient appears to be doing well with regard to his left plantar foot. In fact both foot ulcers actually seem to be doing pretty well. Nonetheless he is having a lot of drainage on the left at this time and again we did obtain a wound culture did show positive for Staph aureus that was reviewed by myself today as well. Nonetheless he is on Bactrim which was shown to be sensitive that should be helping in this regard. Fortunately there is no signs of infection systemically at this point. Objective Constitutional Well-nourished and well-hydrated in no acute distress. Vitals Time Taken: 3:17 PM, Height: 73 in, Weight: 260 lbs, BMI: 34.3, Temperature: 97.7 F, Pulse: 90 bpm, Respiratory Rate: 18 breaths/min, Blood Pressure: 133/68 mmHg. Respiratory normal breathing without difficulty. Psychiatric this patient is able to make decisions and demonstrates good insight into disease process. Alert and Oriented x 3. pleasant and cooperative. General Notes: Patient's wound bed currently showed signs of good granulation at this time. On the left I was able to mechanically debride away the slough with saline and gauze. On the right I did have to perform some sharp debridement to remove some callus and necrotic debris from the surface of the wound which he  tolerated today without complication post debridement the wound bed appears to be doing much better. We did subsequently reapply total contact cast to the left lower extremity today which I applied myself. Integumentary (Hair, Skin) Wound #1 status is Open. Original cause of wound was Gradually Appeared. The wound is located on the [place]. The wound measures 3cm length x 3.2cm width x 0.1cm depth; 7.54cm^2 area and 0.754cm^3 volume. There is Fat Layer (Subcutaneous Tissue) Exposed exposed. There is no tunneling or undermining noted. There is a large amount of serosanguineous drainage noted. The wound margin is flat and intact. There is large (67- 100%) pink granulation within the wound bed. There is a small (1-33%) amount of necrotic tissue within the wound bed including Adherent Slough. Wound #2 status is Open. Original cause of wound was Gradually Appeared. The wound is located on the Right Metatarsal head second. The wound measures 2.4cm length x 1.9cm width x 0.8cm depth; 3.581cm^2 area and 2.865cm^3 volume. There is Fat Layer (Subcutaneous Tissue) Exposed exposed. There is no tunneling or undermining noted. There is a medium amount of serosanguineous drainage noted. The wound margin is flat and intact. There is large (67-100%) pink granulation within the wound bed. There is a small (1-33%) amount of necrotic tissue within the wound bed including Adherent Slough. Assessment Active Problems ICD-10 Type 2 diabetes mellitus with other skin ulcer Non-pressure chronic ulcer of other part of left foot with fat layer exposed Non-pressure chronic ulcer of other  part of right foot with fat layer exposed Essential (primary) hypertension Chronic combined systolic (congestive) and diastolic (congestive) heart failure Venous insufficiency (chronic) (peripheral) Corns and callosities Procedures Wound #2 Pre-procedure diagnosis of Wound #2 is a Diabetic Wound/Ulcer of the Lower Extremity  located on the Right Metatarsal head second .Severity of Tissue Pre Debridement is: Fat layer exposed. There was a Excisional Skin/Subcutaneous Tissue Debridement with a total area of 6 sq cm performed by Lenda Kelp, PA. With the following instrument(s): Curette to remove Viable and Non-Viable tissue/material. Material removed includes Callus, Subcutaneous Tissue, and Slough after achieving pain control using Lidocaine 4% Topical Solution. No specimens were taken. A time out was conducted at 16:00, prior to the start of the procedure. A Minimum amount of bleeding was controlled with Pressure. The procedure was tolerated well with a pain level of 0 throughout and a pain level of 0 following the procedure. Post Debridement Measurements: 2.4cm length x 1.9cm width x 0.8cm depth; 2.865cm^3 volume. Character of Wound/Ulcer Post Debridement is improved. Severity of Tissue Post Debridement is: Fat layer exposed. Post procedure Diagnosis Wound #2: Same as Pre-Procedure Wound #1 Pre-procedure diagnosis of Wound #1 is a Diabetic Wound/Ulcer of the Lower Extremity located on the Left,Plantar Foot . There was a Total Contact Cast Procedure by Lenda Kelp, PA. Post procedure Diagnosis Wound #1: Same as Pre-Procedure Plan Follow-up Appointments: Return Appointment in 1 week. - Wed Return Appointment in: - Friday for cast change Dressing Change Frequency: Wound #1 Left,Plantar Foot: Do not change entire dressing for one week. Wound #2 Right Metatarsal head second: Change Dressing every other day. Skin Barriers/Peri-Wound Care: Wound #1 Left,Plantar Foot: Barrier cream - to periwound Wound Cleansing: Wound #1 Left,Plantar Foot: May shower with protection. - cast protector Wound #2 Right Metatarsal head second: May shower and wash wound with soap and water. Primary Wound Dressing: Wound #1 Left,Plantar Foot: Hydrofera Blue - classic cut to fit inside wound margins Wound #2 Right  Metatarsal head second: Calcium Alginate with Silver - pack into wound bed Secondary Dressing: Wound #1 Left,Plantar Foot: ABD pad Drawtex - cut to fit inside wound margins Carboflex Wound #2 Right Metatarsal head second: Kerlix/Rolled Gauze Dry Gauze Other: - felt callous pad Edema Control: Patient to wear own compression stockings - daily to right leg Avoid standing for long periods of time Elevate legs to the level of the heart or above for 30 minutes daily and/or when sitting, a frequency of: Off-Loading: Total Contact Cast to Left Lower Extremity - foam padding between left great and 2nd toe Open toe surgical shoe to: - right foot daily 1. My suggestion currently is can be that we continue with the current wound care measures which includes a silver alginate dressing to the right plantar foot he seems to be doing well in this regard. 2. I would recommend we switch to [facility] on the left foot and we will continue with the cast that seems to be doing well at this time. We will continue with the other absorptive dressings including the CarboFlex as well as drawtex at this point. 3. I am going to look into a snap VAC for the left foot underneath the cast this could potentially be beneficial for him. 4. I am also going to suggest that the patient continue to take it easy as far as walking in general this will help the right foot as well. The left foot seems to be making excellent progress in my opinion. We will  see patient back for reevaluation in 1 week here in the clinic. If anything worsens or changes patient will contact our office for additional recommendations. To see him on Friday to change out his cast due to how much this is still draining at this point. Electronic Signature(s) Signed: 11/21/2019 4:10:54 PM By: Lenda Kelp PA-C Entered By: Lenda Kelp on 11/21/2019 16:10:53 -------------------------------------------------------------------------------- Total  Contact Cast Details Patient Name: Date of Service: [name], [name] 11/21/2019 2:30 PM Medical Record [id number] Patient Account Number: [id number] Date of Birth/Sex: Treating RN: May 07, 1961 (59 y.o. [name] Primary Care Provider: [name] Other Clinician: Referring Provider: Treating Provider/Extender:Stone III, Dayton Martes, Duanne Limerick in Treatment: 3 Total Contact Cast Applied for Wound Assessment: Wound #1 Left,Plantar Foot Performed By: Physician Lenda Kelp, PA Post Procedure Diagnosis Same as Pre-procedure Electronic Signature(s) Signed: 11/21/2019 6:36:45 PM By: Zenaida Deed RN, BSN Signed: 11/21/2019 7:52:58 PM By: Lenda Kelp PA-C Entered By: Zenaida Deed on 11/21/2019 15:59:41 -------------------------------------------------------------------------------- SuperBill Details Patient Name: Date of Service: [name], [name] 11/21/2019 Medical Record [id number] Patient Account Number: [id number] Date of Birth/Sex: Treating RN: [birth year] (59 y.o. 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 I10 Essential (primary) hypertension I50.42 Chronic combined systolic (congestive) and diastolic (congestive) heart failure I87.2 Venous insufficiency (chronic) (peripheral) L84 Corns and callosities Facility Procedures CPT4 Code Description: 61443154 11042 - DEB SUBQ TISSUE 20 SQ CM/< ICD-10 Diagnosis Description L97.512 Non-pressure chronic ulcer of other part of right foot wi Modifier: th fat layer ex Quantity: 1 posed CPT4 Code Description: [id number] 29445 -  APPLY TOTAL CONTACT LEG CAST ICD-10 Diagnosis Description L97.522 Non-pressure chronic ulcer of other part of left foot wit Modifier: 59 h fat layer exp Quantity: 1 osed Physician Procedures CPT4 Code Description: 5093267 11042 - WC PHYS SUBQ TISS 20 SQ CM ICD-10 Diagnosis Description L97.512 Non-pressure chronic ulcer of other part of right foot with Modifier: fat layer exp Quantity: 1 osed CPT4 Code Description: 1245809 29445 - WC PHYS APPLY TOTAL CONTACT CAST ICD-10 Diagnosis Description L97.522 Non-pressure chronic ulcer of other part of left foot with Modifier: 59 fat layer expo Quantity: 1 sed Electronic Signature(s) Signed: 11/21/2019 4:11:05 PM By: Lenda Kelp PA-C Entered By: Lenda Kelp on 11/21/2019 16:11:04

## 2019-11-23 ENCOUNTER — Encounter (HOSPITAL_BASED_OUTPATIENT_CLINIC_OR_DEPARTMENT_OTHER): Payer: Medicare (Managed Care) | Admitting: Internal Medicine

## 2019-11-23 ENCOUNTER — Other Ambulatory Visit: Payer: Self-pay

## 2019-11-23 DIAGNOSIS — E11622 Type 2 diabetes mellitus with other skin ulcer: Secondary | ICD-10-CM | POA: Diagnosis not present

## 2019-11-23 NOTE — Progress Notes (Signed)
[name] ([id number]) Visit Report for 11/23/2019 HPI Details Patient Name: Date of Service: [name], [name] 11/23/2019 2:45 PM Medical Record [id number] Patient Account Number: [id number] Date of Birth/Sex: Treating RN: 24-Jun-1961 (59 y.o. M) Primary Care Provider: [name] Other Clinician: Referring Provider: Treating Provider/Extender:Sophia Cubero, Zebedee Iba, Duanne Limerick in Treatment: 3 History of Present Illness HPI Description: 10/31/2019 upon evaluation today patient appears to be doing somewhat poorly in regard to his bilateral plantar feet. He has wounds that he tells me have been present since 2012 intermittently off and on. Most recently this has been open for at least the past 6 months to a year. He has been trying to treat this in different ways using Santyl along with various other dressings including Medihoney and even at one point Xeroform. Nothing really has seem to get this completely closed. He was recently in the hospital for cellulitis of his leg subsequently he did have x-rays as well as MRIs that showed negative for any signs of osteomyelitis in regard to the wounds on his feet. Fortunately there is no signs of systemic infection at this time. No fevers, chills, nausea, vomiting, or diarrhea. Patient has previously used Darco offloading shoes as far as frontal floaters as well as postop surgical shoes. He has never been in a total contact cast that may be something we need to strongly consider here. Patient's most recent hemoglobin A1c 1 month ago was 5.3 seems to be very well controlled which is great. Subsequently he has seen vascular as well as podiatry. His ABIs are 1.07 on the left and 1.14 on the right he seems to be doing well he does have chronic venous stasis. 11/07/2019 upon evaluation today patient appears to be doing well with regard to his wounds all things considered. I do not see any severe worsening he still has some callus buildup on the right  more than the left he notes that he has been probably more active than he should as far as walking is concerned is just very hard to not be active. He knows he needs to be more careful in this regard however. He is willing to give the cast a try at this point although he notes that he is a little nervous about this just with regard to balance although he will be very careful and obviously if he has any trouble he knows to contact the office and let me know. 1/22; patient is in for his obligatory first total contact cast change. Our intake nurse reported a very large amount of drainage which is spelled out over to the surrounding skin. Has bilateral diabetic foot wounds. He has Charcot feet. We have been using silver alginate on his wounds. 11/14/2019 on evaluation today patient is actually seeming to make good improvement in regard to his bilateral plantar foot wounds. We have been using a cast on the left side and on the right side he has been using dressings he is changing up his own accord. With that being said he tells me that he is also not walking as much just due to how unsteady he feels. He takes it easy when he does have to walk and when he does not have to walk he is resting. This is probably help in his right foot as well has the left foot which is actually measuring better. In fact both are measuring better. Overall I am very pleased with how things seem to be progressing. The patient does have some odor on the left foot  this does have me concerned about the possibility of infection, and actually probably go ahead and put him on antibiotics today as well as utilizing a continuation of the cast on the left foot I think that will be fine we probably just need to bring him in sooner to change this not last a whole week. 1/29; we brought the patient back today for a total contact cast change on the left out of concern for excessive drainage. We are using drawtex over the wound as the primary  dressing 11/21/2019 on evaluation today patient appears to be doing well with regard to his left plantar foot. In fact both foot ulcers actually seem to be doing pretty well. Nonetheless he is having a lot of drainage on the left at this time and again we did obtain a wound culture did show positive for Staph aureus that was reviewed by myself today as well. Nonetheless he is on Bactrim which was shown to be sensitive that should be helping in this regard. Fortunately there is no signs of infection systemically at this point. 2/5; back in clinic today for a total contact cast change apparently secondary to very significant drainage. Still using drawtex Electronic Signature(s) Signed: 11/23/2019 5:45:44 PM By: Baltazar Najjar MD Entered By: Baltazar Najjar on 11/23/2019 17:05:25 -------------------------------------------------------------------------------- Physical Exam Details Patient Name: Date of Service: [name], [name] 11/23/2019 2:45 PM Medical Record [id number] Patient Account Number: [id number] Date of Birth/Sex: Treating RN: 02-27-1961 (59 y.o. M) Primary Care Provider: [name] Other Clinician: Referring Provider: Treating Provider/Extender:Kinzlie Harney, Zebedee Iba, Duanne Limerick in Treatment: 3 Constitutional Patient is hypertensive.. Pulse regular and within target range for patient.Marland Kitchen Respirations regular, non-labored and within target range.. Temperature is normal and within the target range for the patient.Marland Kitchen Appears in no distress. Notes Wound exam; I did not actually look at the wound today. Total contact cast was changed in the standard fashion. No complications Electronic Signature(s) Signed: 11/23/2019 5:45:44 PM By: Baltazar Najjar MD Entered By: Baltazar Najjar on 11/23/2019 17:06:00 -------------------------------------------------------------------------------- Physician Orders Details Patient Name: Date of Service: [name], [name] 11/23/2019 2:45 PM Medical Record  [id number] Patient Account Number: [id number] Date of Birth/Sex: Treating RN: 12-Feb-1961 (59 y.o. Katherina Right Primary Care Provider: [name] Other Clinician: Referring Provider: Treating Provider/Extender:Illona Bulman, Zebedee Iba, Duanne Limerick in Treatment: 3 Verbal / Phone Orders: No Diagnosis Coding ICD-10 Coding Code Description E11.622 Type 2 diabetes mellitus with other skin ulcer L97.522 Non-pressure chronic ulcer of other part of left foot with fat layer exposed L97.512 Non-pressure chronic ulcer of other part of right foot with fat layer exposed I10 Essential (primary) hypertension I50.42 Chronic combined systolic (congestive) and diastolic (congestive) heart failure I87.2 Venous insufficiency (chronic) (peripheral) L84 Corns and callosities Follow-up Appointments Return Appointment in 1 week. - Wed Dressing Change Frequency Wound #1 Left,Plantar Foot Do not change entire dressing for one week. Wound #2 Right Metatarsal head second Change Dressing every other day. Skin Barriers/Peri-Wound Care Wound #1 Left,Plantar Foot Barrier cream - to periwound Wound Cleansing Wound #1 Left,Plantar Foot May shower with protection. - cast protector Wound #2 Right Metatarsal head second May shower and wash wound with soap and water. Primary Wound Dressing Wound #1 Left,Plantar Foot Hydrofera Blue - classic cut to fit inside wound margins Wound #2 Right Metatarsal head second Calcium Alginate with Silver - pack into wound bed Secondary Dressing Wound #1 Left,Plantar Foot ABD pad Drawtex - cut to fit inside wound margins, 2 layers Carboflex Wound #2 Right Metatarsal head second Kerlix/Rolled Gauze  Dry Gauze Other: - felt callous pad Edema Control Patient to wear own compression stockings - daily to right leg Avoid standing for long periods of time Elevate legs to the level of the heart or above for 30 minutes daily and/or when sitting, a  frequency of: Off-Loading Total Contact Cast to Left Lower Extremity - foam padding between left great and 2nd toe Open toe surgical shoe to: - right foot daily Electronic Signature(s) Signed: 11/23/2019 5:45:44 PM By: Baltazar Najjar MD Signed: 11/23/2019 5:49:59 PM By: Cherylin Mylar Entered By: Cherylin Mylar on 11/23/2019 15:32:12 -------------------------------------------------------------------------------- Problem List Details Patient Name: Date of Service: [name], [name] 11/23/2019 2:45 PM Medical Record [id number] Patient Account Number: [id number] Date of Birth/Sex: Treating RN: 1961-05-15 (59 y.o. Katherina Right Primary Care Provider: [name] Other Clinician: Referring Provider: Treating Provider/Extender:Cortlyn Cannell, Zebedee Iba, Duanne Limerick in Treatment: 3 Active Problems ICD-10 Evaluated Encounter Code Description Active Date Today Diagnosis E11.622 Type 2 diabetes mellitus with other skin ulcer 10/31/2019 No Yes L97.522 Non-pressure chronic ulcer of other part of left foot 10/31/2019 No Yes with fat layer exposed L97.512 Non-pressure chronic ulcer of other part of right foot 10/31/2019 No Yes with fat layer exposed I10 Essential (primary) hypertension 10/31/2019 No Yes I50.42 Chronic combined systolic (congestive) and diastolic 10/31/2019 No Yes (congestive) heart failure I87.2 Venous insufficiency (chronic) (peripheral) 10/31/2019 No Yes L84 Corns and callosities 11/14/2019 No Yes Inactive Problems Resolved Problems Electronic Signature(s) Signed: 11/23/2019 5:45:44 PM By: Baltazar Najjar MD Entered By: Baltazar Najjar on 11/23/2019 17:04:30 -------------------------------------------------------------------------------- Progress Note Details Patient Name: Date of Service: [name], [name] 11/23/2019 2:45 PM Medical Record [id number] Patient Account Number: [id number] Date of Birth/Sex: Treating RN: 10/21/1960 (59 y.o. M) Primary Care Provider:  [name] Other Clinician: Referring Provider: Treating Provider/Extender:Isaiahs Chancy, Zebedee Iba, Duanne Limerick in Treatment: 3 Subjective History of Present Illness (HPI) 10/31/2019 upon evaluation today patient appears to be doing somewhat poorly in regard to his bilateral plantar feet. He has wounds that he tells me have been present since 2012 intermittently off and on. Most recently this has been open for at least the past 6 months to a year. He has been trying to treat this in different ways using Santyl along with various other dressings including Medihoney and even at one point Xeroform. Nothing really has seem to get this completely closed. He was recently in the hospital for cellulitis of his leg subsequently he did have x-rays as well as MRIs that showed negative for any signs of osteomyelitis in regard to the wounds on his feet. Fortunately there is no signs of systemic infection at this time. No fevers, chills, nausea, vomiting, or diarrhea. Patient has previously used Darco offloading shoes as far as frontal floaters as well as postop surgical shoes. He has never been in a total contact cast that may be something we need to strongly consider here. Patient's most recent hemoglobin A1c 1 month ago was 5.3 seems to be very well controlled which is great. Subsequently he has seen vascular as well as podiatry. His ABIs are 1.07 on the left and 1.14 on the right he seems to be doing well he does have chronic venous stasis. 11/07/2019 upon evaluation today patient appears to be doing well with regard to his wounds all things considered. I do not see any severe worsening he still has some callus buildup on the right more than the left he notes that he has been probably more active than he should as far as walking is concerned is  just very hard to not be active. He knows he needs to be more careful in this regard however. He is willing to give the cast a try at this point although he notes  that he is a little nervous about this just with regard to balance although he will be very careful and obviously if he has any trouble he knows to contact the office and let me know. 1/22; patient is in for his obligatory first total contact cast change. Our intake nurse reported a very large amount of drainage which is spelled out over to the surrounding skin. Has bilateral diabetic foot wounds. He has Charcot feet. We have been using silver alginate on his wounds. 11/14/2019 on evaluation today patient is actually seeming to make good improvement in regard to his bilateral plantar foot wounds. We have been using a cast on the left side and on the right side he has been using dressings he is changing up his own accord. With that being said he tells me that he is also not walking as much just due to how unsteady he feels. He takes it easy when he does have to walk and when he does not have to walk he is resting. This is probably help in his right foot as well has the left foot which is actually measuring better. In fact both are measuring better. Overall I am very pleased with how things seem to be progressing. The patient does have some odor on the left foot this does have me concerned about the possibility of infection, and actually probably go ahead and put him on antibiotics today as well as utilizing a continuation of the cast on the left foot I think that will be fine we probably just need to bring him in sooner to change this not last a whole week. 1/29; we brought the patient back today for a total contact cast change on the left out of concern for excessive drainage. We are using drawtex over the wound as the primary dressing 11/21/2019 on evaluation today patient appears to be doing well with regard to his left plantar foot. In fact both foot ulcers actually seem to be doing pretty well. Nonetheless he is having a lot of drainage on the left at this time and again we did obtain a wound  culture did show positive for Staph aureus that was reviewed by myself today as well. Nonetheless he is on Bactrim which was shown to be sensitive that should be helping in this regard. Fortunately there is no signs of infection systemically at this point. 2/5; back in clinic today for a total contact cast change apparently secondary to very significant drainage. Still using drawtex Objective Constitutional Patient is hypertensive.. Pulse regular and within target range for patient.Marland Kitchen Respirations regular, non-labored and within target range.. Temperature is normal and within the target range for the patient.Marland Kitchen Appears in no distress. Vitals Time Taken: 2:48 PM, Height: 73 in, Weight: 260 lbs, BMI: 34.3, Temperature: 98.2 F, Pulse: 85 bpm, Respiratory Rate: 20 breaths/min, Blood Pressure: 152/77 mmHg. General Notes: Wound exam; I did not actually look at the wound today. Total contact cast was changed in the standard fashion. No complications Integumentary (Hair, Skin) Wound #1 status is Open. Original cause of wound was Gradually Appeared. The wound is located on the Left,Plantar Foot. The wound measures 3cm length x 3.2cm width x 0.1cm depth; 7.54cm^2 area and 0.754cm^3 volume. There is Fat Layer (Subcutaneous Tissue) Exposed exposed. There is no tunneling or  undermining noted. There is a large amount of serosanguineous drainage noted. The wound margin is flat and intact. There is large (67- 100%) pink granulation within the wound bed. There is a small (1-33%) amount of necrotic tissue within the wound bed including Adherent Slough. Assessment Active Problems ICD-10 Type 2 diabetes mellitus with other skin ulcer Non-pressure chronic ulcer of other part of left foot with fat layer exposed Non-pressure chronic ulcer of other part of right foot with fat layer exposed Essential (primary) hypertension Chronic combined systolic (congestive) and diastolic (congestive) heart failure Venous  insufficiency (chronic) (peripheral) Corns and callosities Procedures Wound #1 Pre-procedure diagnosis of Wound #1 is a Diabetic Wound/Ulcer of the Lower Extremity located on the Left,Plantar Foot . There was a Total Contact Cast Procedure by Ricard Dillon., MD. Post procedure Diagnosis Wound #1: Same as Pre-Procedure Plan Follow-up Appointments: Return Appointment in 1 week. - Wed Dressing Change Frequency: Wound #1 Left,Plantar Foot: Do not change entire dressing for one week. Wound #2 Right Metatarsal head second: Change Dressing every other day. Skin Barriers/Peri-Wound Care: Wound #1 Left,Plantar Foot: Barrier cream - to periwound Wound Cleansing: Wound #1 Left,Plantar Foot: May shower with protection. - cast protector Wound #2 Right Metatarsal head second: May shower and wash wound with soap and water. Primary Wound Dressing: Wound #1 Left,Plantar Foot: Hydrofera Blue - classic cut to fit inside wound margins Wound #2 Right Metatarsal head second: Calcium Alginate with Silver - pack into wound bed Secondary Dressing: Wound #1 Left,Plantar Foot: ABD pad Drawtex - cut to fit inside wound margins, 2 layers Carboflex Wound #2 Right Metatarsal head second: Kerlix/Rolled Gauze Dry Gauze Other: - felt callous pad Edema Control: Patient to wear own compression stockings - daily to right leg Avoid standing for long periods of time Elevate legs to the level of the heart or above for 30 minutes daily and/or when sitting, a frequency of: Off-Loading: Total Contact Cast to Left Lower Extremity - foam padding between left great and 2nd toe Open toe surgical shoe to: - right foot daily 1. Hydrofera Blue as the primary dressing with silver alginate packing 2. I replace the total contact cast Electronic Signature(s) Signed: 11/23/2019 5:45:44 PM By: Linton Ham MD Entered By: Linton Ham on 11/23/2019  17:06:47 -------------------------------------------------------------------------------- Total Contact Cast Details Patient Name: Date of Service: [name], [name] 11/23/2019 2:45 PM Medical Record [id number] Patient Account Number: [id number] Date of Birth/Sex: Treating RN: 1960/12/02 (59 y.o. M) Primary Care Provider: Billey Gosling Other Clinician: Referring Provider: Treating Provider/Extender:Maddyx Wieck, Baird Lyons, Nadine Counts in Treatment: 3 Total Contact Cast Applied for Wound Assessment: Wound #1 Left,Plantar Foot Performed By: Physician Ricard Dillon., MD Post Procedure Diagnosis Same as Pre-procedure Electronic Signature(s) Signed: 11/23/2019 5:45:44 PM By: Linton Ham MD Entered By: Linton Ham on 11/23/2019 17:04:45 -------------------------------------------------------------------------------- SuperBill Details Patient Name: Date of Service: [name], [name] 11/23/2019 Medical Record [id number] Patient Account Number: [id number] Date of Birth/Sex: Treating RN: [birth year] (59 y.o. [name] Primary Care Provider: Billey Gosling Other Clinician: Referring Provider: Treating Provider/Extender:Burris Matherne, Baird Lyons, Nadine Counts in Treatment: 3 Diagnosis Coding ICD-10 Codes Code Description E11.622 Type 2 diabetes mellitus with other skin ulcer L97.522 Non-pressure chronic ulcer of other part of left foot with fat layer exposed L97.512 Non-pressure chronic ulcer of other part of right foot with fat layer exposed I10 Essential (primary) hypertension I50.42 Chronic combined systolic (congestive) and diastolic (congestive) heart failure I87.2 Venous insufficiency (chronic) (peripheral) L84 Corns and callosities Facility Procedures CPT4 Code Description: [id number] 29445 -  APPLY TOTAL CONTACT LEG CAST ICD-10 Diagnosis Description L97.522 Non-pressure chronic ulcer of other part of left foot with Modifier: fat layer exp Quantity: 1 osed Physician  Procedures Electronic Signature(s) Signed: 11/23/2019 5:45:44 PM By: Baltazar Najjarobson, Daviona Herbert MD Entered By: Baltazar Najjarobson, Natsuko Kelsay on 11/23/2019 17:06:58

## 2019-11-23 NOTE — Progress Notes (Signed)
[name], [name] ([id number]) Visit Report for 11/23/2019 Arrival Information Details Patient Name: Date of Service: [name], [name] 11/23/2019 2:45 PM Medical Record [id number] Patient Account Number: [id number] Date of Birth/Sex: Treating RN: 1961/03/15 (59 y.o. Judie Petit) Yevonne Pax Primary Care Teiara Baria: [name] Other Clinician: Referring Janine Reller: Treating Hutch Rhett/Extender:Robson, Zebedee Iba, Duanne Limerick in Treatment: 3 Visit Information History Since Last Visit All ordered tests and consults were No Patient Arrived: Ambulatory completed: Arrival Time: 14:47 Added or deleted any medications: No Accompanied By: girlfriend Any new allergies or adverse reactions: No Transfer Assistance: None Had a fall or experienced change in No Patient Identification Verified: Yes activities of daily living that may affect Patient Requires Transmission-Based No risk of falls: Precautions: Signs or symptoms of abuse/neglect since No Patient Has Alerts: Yes last visito Patient Alerts: L ABI 1.07 Hospitalized since last visit: No (04/2019) Implantable device outside of the clinic No R ABI 1.14 excluding (04/2019) cellular tissue based products placed in the center since last visit: Has Dressing in Place as Prescribed: Yes Has Footwear/Offloading in Place as Yes Prescribed: Left: Total Contact Cast Pain Present Now: No Electronic Signature(s) Signed: 11/23/2019 5:25:54 PM By: Yevonne Pax RN Entered By: Yevonne Pax on 11/23/2019 14:48:53 -------------------------------------------------------------------------------- Lower Extremity Assessment Details Patient Name: Date of Service: [name], [name] 11/23/2019 2:45 PM Medical Record [id number] Patient Account Number: [id number] Date of Birth/Sex: Treating RN: [birth year] (59 y.o. [name] Primary Care Kaycen Whitworth: [name] Other Clinician: Referring Joud Ingwersen: Treating Deago Burruss/Extender:Robson, Zebedee Iba, Duanne Limerick in  Treatment: 3 Edema Assessment Assessed: [Left: No] [Right: No] Edema: [Left: Yes] [Right: Yes] Calf Left: Right: Point of Measurement: 30 cm From Medial Instep 38.5 cm 41 cm Ankle Left: Right: Point of Measurement: 11 cm From Medial Instep 25 cm 29.2 cm Electronic Signature(s) Signed: 11/23/2019 5:25:54 PM By: Yevonne Pax RN Entered By: Yevonne Pax on 11/23/2019 15:19:31 -------------------------------------------------------------------------------- Multi Wound Chart Details Patient Name: Date of Service: [name], [name] 11/23/2019 2:45 PM Medical Record [id number] Patient Account Number: [id number] Date of Birth/Sex: Treating RN: [birth year] (59 y.o. M) Primary Care Lakesia Dahle: [name] Other Clinician: Referring Jaryd Drew: Treating Polk Minor/Extender:Robson, Zebedee Iba, Duanne Limerick in Treatment: 3 Vital Signs Height(in): 73 Pulse(bpm): 85 Weight(lbs): 260 Blood Pressure(mmHg): 152/77 Body Mass Index(BMI): 34 Temperature(F): 98.2 Respiratory 20 Rate(breaths/min): Photos: [1:No Photos] [N/A:N/A] Wound Location: [1:Left Foot - Plantar] [N/A:N/A] Wounding Event: [1:Gradually Appeared] [N/A:N/A] Primary Etiology: [1:Diabetic Wound/Ulcer of the N/A Lower Extremity] Comorbid History: [1:Anemia, Sleep Apnea, Congestive Heart Failure, Hypertension, Peripheral Venous Disease, Cirrhosis , Type II Diabetes, Osteoarthritis, Neuropathy] [N/A:N/A] Date Acquired: [1:02/16/2019] [N/A:N/A] Weeks of Treatment: [1:3] [N/A:N/A] Wound Status: [1:Open] [N/A:N/A] Measurements L x W x D [1:3x3.2x0.1] [N/A:N/A] (cm) Area (cm) : [1:7.54] [N/A:N/A] Volume (cm) : [1:0.754] [N/A:N/A] % Reduction in Area: [1:15.80%] [N/A:N/A] % Reduction in Volume: [1:88.00%] [N/A:N/A] Classification: [1:Grade 2] [N/A:N/A] Exudate Amount: [1:Large] [N/A:N/A] Exudate Type: [1:Serosanguineous] [N/A:N/A] Exudate Color: [1:red, brown] [N/A:N/A] Wound Margin: [1:Flat and Intact] [N/A:N/A] Granulation  Amount: [1:Large (67-100%)] [N/A:N/A] Granulation Quality: [1:Pink] [N/A:N/A] Necrotic Amount: [1:Small (1-33%)] [N/A:N/A] Exposed Structures: [1:Fat Layer (Subcutaneous Tissue) Exposed: Yes Fascia: No Tendon: No Muscle: No Joint: No Bone: No] [N/A:N/A] Epithelialization: [1:Small (1-33%) Total Contact Cast] [N/A:N/A N/A] Treatment Notes Electronic Signature(s) Signed: 11/23/2019 5:45:44 PM By: Baltazar Najjar MD Entered By: Baltazar Najjar on 11/23/2019 17:04:38 -------------------------------------------------------------------------------- Multi-Disciplinary Care Plan Details Patient Name: Date of Service: [name], [name] 11/23/2019 2:45 PM Medical Record [id number] Patient Account Number: [id number] Date of Birth/Sex: Treating RN: Jan 21, 1961 (59 y.o. Katherina Right Primary Care Johnni Wunschel: [name] Other  Clinician: Referring Jonte Shiller: Treating Margaretta Chittum/Extender:Robson, Baird Lyons, Nadine Counts in Treatment: 3 Active Inactive Nutrition Nursing Diagnoses: Potential for alteratiion in Nutrition/Potential for imbalanced nutrition Goals: Patient/caregiver will maintain therapeutic glucose control Date Initiated: 10/31/2019 Target Resolution Date: 11/28/2019 Goal Status: Active Interventions: Assess HgA1c results as ordered upon admission and as needed Provide education on elevated blood sugars and impact on wound healing Treatment Activities: Patient referred to Primary Care Physician for further nutritional evaluation : 10/31/2019 Notes: Peripheral Neuropathy Nursing Diagnoses: Knowledge deficit related to disease process and management of peripheral neurovascular dysfunction Goals: Patient/caregiver will verbalize understanding of disease process and disease management Date Initiated: 10/31/2019 Target Resolution Date: 11/28/2019 Goal Status: Active Interventions: Assess signs and symptoms of neuropathy upon admission and as needed Provide education on Management  of Neuropathy and Related Ulcers Treatment Activities: Patient referred for customized footwear/offloading : 10/31/2019 Notes: Wound/Skin Impairment Nursing Diagnoses: Impaired tissue integrity Knowledge deficit related to ulceration/compromised skin integrity Goals: Patient/caregiver will verbalize understanding of skin care regimen Date Initiated: 10/31/2019 Target Resolution Date: 12/26/2019 Goal Status: Active Ulcer/skin breakdown will have a volume reduction of 30% by week 4 Date Initiated: 10/31/2019 Target Resolution Date: 11/28/2019 Goal Status: Active Interventions: Assess patient/caregiver ability to obtain necessary supplies Assess patient/caregiver ability to perform ulcer/skin care regimen upon admission and as needed Assess ulceration(s) every visit Provide education on ulcer and skin care Treatment Activities: Skin care regimen initiated : 10/31/2019 Topical wound management initiated : 10/31/2019 Notes: Electronic Signature(s) Signed: 11/23/2019 5:49:59 PM By: Kela Millin Entered By: Kela Millin on 11/23/2019 15:32:23 -------------------------------------------------------------------------------- Pain Assessment Details Patient Name: Date of Service: NIHAR, KLUS 11/23/2019 2:45 PM Medical Record [id number] Patient Account Number: [id number] Date of Birth/Sex: Treating RN: [birth year] (59 y.o. [name] Primary Care Rachel Samples: Billey Gosling Other Clinician: Referring Alban Marucci: Treating Sinahi Knights/Extender:Robson, Baird Lyons, Nadine Counts in Treatment: 3 Active Problems Location of Pain Severity and Description of Pain Patient Has Paino No Site Locations Pain Management and Medication Current Pain Management: Electronic Signature(s) Signed: 11/23/2019 5:25:54 PM By: Carlene Coria RN Entered By: Carlene Coria on 11/23/2019 14:49:52 -------------------------------------------------------------------------------- Patient/Caregiver Education  Details Patient Name: Date of Service: AENGUS, SAUCEDA 2/5/2021andnbsp2:45 PM Medical Record [id number] Patient Account Number: [id number] Date of Birth/Gender: [birth year] (59 y.o. M) Treating RN: Kela Millin Primary Care Physician: Billey Gosling Other Clinician: Referring Physician: Treating Physician/Extender:Robson, Baird Lyons, Nadine Counts in Treatment: 3 Education Assessment Education Provided To: Patient Education Topics Provided Elevated Blood Sugar/ Impact on Healing: Methods: Explain/Verbal Responses: State content correctly Peripheral Neuropathy: Methods: Explain/Verbal Responses: State content correctly Wound/Skin Impairment: Methods: Explain/Verbal Responses: State content correctly Electronic Signature(s) Signed: 11/23/2019 5:49:59 PM By: Kela Millin Entered By: Kela Millin on 11/23/2019 15:34:45 -------------------------------------------------------------------------------- Wound Assessment Details Patient Name: Date of Service: LAVERT, MATOUSEK 11/23/2019 2:45 PM Medical Record [id number] Patient Account Number: [id number] Date of Birth/Sex: Treating RN: 19-Jun-1961 (59 y.o. Jerilynn Mages) Carlene Coria Primary Care Avraham Benish: Billey Gosling Other Clinician: Referring Indi Willhite: Treating Mekaila Tarnow/Extender:Robson, Baird Lyons, Nadine Counts in Treatment: 3 Wound Status Wound Number: 1 Primary Diabetic Wound/Ulcer of the Lower Extremity Etiology: Wound Location: Left Foot - Plantar Wound Open Wounding Event: Gradually Appeared Status: Date Acquired: 02/16/2019 Comorbid Anemia, Sleep Apnea, Congestive Heart Weeks Of Treatment: 3 History: Failure, Hypertension, Peripheral Venous Clustered Wound: No Disease, Cirrhosis , Type II Diabetes, Osteoarthritis, Neuropathy Wound Measurements Length: (cm) 3 % Re Width: (cm) 3.2 % Re Depth: (cm) 0.1 Epit Area: (cm) 7.54 Tun Volume: (cm) 0.754 Und Wound Description Classification: Grade 2 Fou Wound  Margin: Flat and Intact  Slo Exudate Amount: Large Exudate Type: Serosanguineous Exudate Color: red, brown Wound Bed Granulation Amount: Large (67-100%) Granulation Quality: Pink Fascia Expo Necrotic Amount: Small (1-33%) Fat Layer ( Necrotic Quality: Adherent Slough Tendon Expo Muscle Expo Joint Expos Bone Expose l Odor After Cleansing: No ugh/Fibrino Yes Exposed Structure sed: No Subcutaneous Tissue) Exposed: Yes sed: No sed: No ed: No d: No duction in Area: 15.8% duction in Volume: 88% helialization: Small (1-33%) neling: No ermining: No Electronic Signature(s) Signed: 11/23/2019 5:25:54 PM By: Yevonne Pax RN Entered By: Yevonne Pax on 11/23/2019 15:20:09 -------------------------------------------------------------------------------- Vitals Details Patient Name: Date of Service: TYWONE, BEMBENEK 11/23/2019 2:45 PM Medical Record [id number] Patient Account Number: [id number] Date of Birth/Sex: Treating RN: [birth year] (59 y.o. Judie Petit) Yevonne Pax Primary Care Aijalon Demuro: [name] Other Clinician: Referring Cailan General: Treating Renetta Suman/Extender:Robson, Zebedee Iba, Duanne Limerick in Treatment: 3 Vital Signs Time Taken: 14:48 Temperature (F): 98.2 Height (in): 73 Pulse (bpm): 85 Weight (lbs): 260 Respiratory Rate (breaths/min): 20 Body Mass Index (BMI): 34.3 Blood Pressure (mmHg): 152/77 Reference Range: 80 - 120 mg / dl Electronic Signature(s) Signed: 11/23/2019 5:25:54 PM By: Yevonne Pax RN Entered By: Yevonne Pax on 11/23/2019 14:49:43

## 2019-11-27 NOTE — Progress Notes (Signed)
[name], [name] ([id number]) Visit Report for 11/21/2019 Arrival Information Details Patient Name: Date of Service: [name], [name] 11/21/2019 2:30 PM Medical Record [id number] Patient Account Number: [id number] Date of Birth/Sex: Treating RN: [birth year] (59 y.o. [name] Primary Care Quinisha Mould: [name] Other Clinician: Referring Ionna Avis: Treating Essynce Munsch/Extender:Stone III, Dayton Martes, Stacy Weeks in Treatment: 3 Visit Information History Since Last Visit Added or deleted any medications: No Patient Arrived: [name] Any new allergies or adverse reactions: No Arrival Time: 15:15 Had a fall or experienced change in No Accompanied By: alone activities of daily living that may affect Transfer Assistance: None risk of falls: Patient Identification Verified: Yes Signs or symptoms of abuse/neglect No Secondary Verification Process Yes since last visito Completed: Hospitalized since last visit: No Patient Requires Transmission-Based No Implantable device outside of the clinic No Precautions: excluding Patient Has Alerts: Yes cellular tissue based products placed in Patient Alerts: L ABI 1.07 the center (04/2019) since last visit: R ABI 1.14 Has Dressing in Place as Prescribed: Yes (04/2019) Has Compression in Place as Prescribed: No Has Footwear/Offloading in Place as Yes Prescribed: Left: Total Contact Cast Pain Present Now: No Electronic Signature(s) Signed: 11/27/2019 5:30:42 PM By: Zandra Abts RN, BSN Entered By: Zandra Abts on 11/21/2019 15:15:31 -------------------------------------------------------------------------------- Lower Extremity Assessment Details Patient Name: Date of Service: [name], [name] 11/21/2019 2:30 PM Medical Record [id number] Patient Account Number: [id number] Date of Birth/Sex: Treating RN: [birth year] (59 y.o. [name] Primary Care Leanah Kolander: [name] Other Clinician: Referring Makaelah Cranfield: Treating  Ilianna Bown/Extender:Stone III, Dayton Martes, Stacy Weeks in Treatment: 3 Edema Assessment Assessed: [Left: No] [Right: No] Edema: [Left: Yes] [Right: Yes] Calf Left: Right: Point of Measurement: 30 cm From Medial Instep 38.5 cm 41 cm Ankle Left: Right: Point of Measurement: 11 cm From Medial Instep 25 cm 29.2 cm Vascular Assessment Pulses: Dorsalis Pedis Palpable: [Left:Yes] [Right:Yes] Electronic Signature(s) Signed: 11/27/2019 5:30:42 PM By: Zandra Abts RN, BSN Entered By: Zandra Abts on 11/21/2019 15:33:47 -------------------------------------------------------------------------------- Multi-Disciplinary Care Plan Details Patient Name: Date of Service: [name], [name] 11/21/2019 2:30 PM Medical Record [id number] Patient Account Number: [id number] Date of Birth/Sex: Treating RN: Aug 17, 1961 (59 y.o. [name] Primary Care Lorrene Graef: [name] Other Clinician: Referring Gurman Ashland: Treating Emelia Sandoval/Extender:Stone III, Dayton Martes, Stacy Weeks in Treatment: 3 Active Inactive Nutrition Nursing Diagnoses: Potential for alteratiion in Nutrition/Potential for imbalanced nutrition Goals: Patient/caregiver will maintain therapeutic glucose control Date Initiated: 10/31/2019 Target Resolution Date: 11/28/2019 Goal Status: Active Interventions: Assess HgA1c results as ordered upon admission and as needed Provide education on elevated blood sugars and impact on wound healing Treatment Activities: Patient referred to Primary Care Physician for further nutritional evaluation : 10/31/2019 Notes: Peripheral Neuropathy Nursing Diagnoses: Knowledge deficit related to disease process and management of peripheral neurovascular dysfunction Goals: Patient/caregiver will verbalize understanding of disease process and disease management Date Initiated: 10/31/2019 Target Resolution Date: 11/28/2019 Goal Status: Active Interventions: Assess signs and symptoms of neuropathy upon  admission and as needed Provide education on Management of Neuropathy and Related Ulcers Treatment Activities: Patient referred for customized footwear/offloading : 10/31/2019 Notes: Wound/Skin Impairment Nursing Diagnoses: Impaired tissue integrity Knowledge deficit related to ulceration/compromised skin integrity Goals: Patient/caregiver will verbalize understanding of skin care regimen Date Initiated: 10/31/2019 Target Resolution Date: 12/26/2019 Goal Status: Active Ulcer/skin breakdown will have a volume reduction of 30% by week 4 Date Initiated: 10/31/2019 Target Resolution Date: 11/28/2019 Goal Status: Active Interventions: Assess patient/caregiver ability to obtain necessary supplies Assess patient/caregiver ability to perform ulcer/skin care regimen upon admission and as needed Assess  ulceration(s) every visit Provide education on ulcer and skin care Treatment Activities: Skin care regimen initiated : 10/31/2019 Topical wound management initiated : 10/31/2019 Notes: Electronic Signature(s) Signed: 11/21/2019 6:36:45 PM By: Baruch Gouty RN, BSN Entered By: Baruch Gouty on 11/21/2019 15:56:51 -------------------------------------------------------------------------------- Pain Assessment Details Patient Name: Date of Service: [name], [name] 11/21/2019 2:30 PM Medical Record [id number] Patient Account Number: [id number] Date of Birth/Sex: Treating RN: 05-10-1961 (59 y.o. [name] Primary Care Alexea Blase: Billey Gosling Other Clinician: Referring Lupe Handley: Treating Rihan Schueler/Extender:Stone III, Elby Showers, Stacy Weeks in Treatment: 3 Active Problems Location of Pain Severity and Description of Pain Patient Has Paino No Site Locations Pain Management and Medication Current Pain Management: Electronic Signature(s) Signed: 11/27/2019 5:30:42 PM By: [name] RN, BSN Entered By: [name] on 11/21/2019  15:17:45 -------------------------------------------------------------------------------- Patient/Caregiver Education Details Patient Name: Date of Service: [name], [name] 2/3/2021andnbsp2:30 PM Medical Record [id number] Patient Account Number: [id number] Date of Birth/Gender: Jul 07, 1961 (58 y.o. M) Treating RN: Baruch Gouty Primary Care Physician: Billey Gosling Other Clinician: Referring Physician: Treating Physician/Extender:Stone III, Elby Showers, Nadine Counts in Treatment: 3 Education Assessment Education Provided To: Patient Education Topics Provided Offloading: Handouts: What is Offloadingo Methods: Explain/Verbal, Printed Responses: Reinforcements needed, State content correctly Wound/Skin Impairment: Methods: Explain/Verbal Responses: Reinforcements needed, State content correctly Electronic Signature(s) Signed: 11/21/2019 6:36:45 PM By: Baruch Gouty RN, BSN Entered By: Baruch Gouty on 11/21/2019 15:57:33 -------------------------------------------------------------------------------- Wound Assessment Details Patient Name: Date of Service: [name], [name] 11/21/2019 2:30 PM Medical Record [id number] Patient Account Number: [id number] Date of Birth/Sex: Treating RN: 1961-05-07 (59 y.o. M) Primary Care Akia Montalban: Billey Gosling Other Clinician: Referring Madysen Faircloth: Treating Anibal Quinby/Extender:Stone III, Elby Showers, Stacy Weeks in Treatment: 3 Wound Status Wound Number: 1 Primary Diabetic Wound/Ulcer of the Lower Extremity Etiology: Wound Location: Left Foot - Plantar Wound Open Wounding Event: Gradually Appeared Status: Date Acquired: 02/16/2019 Comorbid Anemia, Sleep Apnea, Congestive Heart Weeks Of Treatment: 3 History: Failure, Hypertension, Peripheral Venous Clustered Wound: No Disease, Cirrhosis , Type II Diabetes, Osteoarthritis, Neuropathy Photos Wound Measurements Length: (cm) 3 Width: (cm) 3.2 Depth: (cm) 0.1 Area: (cm) 7.54 Volume: (cm)  0.754 Wound Description Classification: Grade 2 Wound Margin: Flat and Intact Exudate Amount: Large Exudate Type: Serosanguineous Exudate Color: red, brown Wound Bed Granulation Amount: Large (67-100%) Granulation Quality: Pink Necrotic Amount: Small (1-33%) Necrotic Quality: Adherent Slough fter Cleansing: No ino Yes Exposed Structure ed: No ubcutaneous Tissue) Exposed: Yes ed: No ed: No d: No : No % Reduction in Area: 15.8% % Reduction in Volume: 88% Epithelialization: Small (1-33%) Tunneling: No Undermining: No Foul Odor A Slough/Fibr Fascia Expos Fat Layer (S Tendon Expos Muscle Expos Joint Expose Bone Exposed Electronic Signature(s) Signed: 11/22/2019 4:25:19 PM By: Mikeal Hawthorne EMT/HBOT Entered By: Mikeal Hawthorne on 11/22/2019 15:47:50 -------------------------------------------------------------------------------- Wound Assessment Details Patient Name: Date of Service: [name], [name] 11/21/2019 2:30 PM Medical Record [id number] Patient Account Number: [id number] Date of Birth/Sex: Treating RN: November 28, 1960 (60 y.o. M) Primary Care Yevette Knust: Billey Gosling Other Clinician: Referring Ilyas Lipsitz: Treating Aviona Martenson/Extender:Stone III, Elby Showers, Stacy Weeks in Treatment: 3 Wound Status Wound Number: 2 Primary Diabetic Wound/Ulcer of the Lower Extremity Etiology: Wound Location: Right Metatarsal head second Wound Open Wounding Event: Gradually Appeared Status: Date Acquired: 02/16/2019 Comorbid Anemia, Sleep Apnea, Congestive Heart Weeks Of Treatment: 3 History: Failure, Hypertension, Peripheral Venous Clustered Wound: No Disease, Cirrhosis , Type II Diabetes, Osteoarthritis, Neuropathy Photos Wound Measurements Length: (cm) 2.4 Width: (cm) 1.9 Depth: (cm) 0.8 Area: (cm) 3.581 Volume: (cm) 2.865 Wound Description Classification: Grade 2 Wound Margin: Flat and  Intact Exudate Amount: Medium Exudate Type: Serosanguineous Exudate Color: red,  brown Wound Bed Granulation Amount: Large (67-100%) Granulation Quality: Pink Necrotic Amount: Small (1-33%) Necrotic Quality: Adherent Slough r After Cleansing: No ibrino Yes Exposed Structure ed: No ubcutaneous Tissue) Exposed: Yes ed: No ed: No d: No : No % Reduction in Area: -62.8% % Reduction in Volume: -8.6% Epithelialization: Small (1-33%) Tunneling: No Undermining: No Foul Odo Slough/F Fascia Expos Fat Layer (S Tendon Expos Muscle Expos Joint Expose Bone Exposed Electronic Signature(s) Signed: 11/22/2019 4:25:19 PM By: Benjaman Kindler EMT/HBOT Entered By: Benjaman Kindler on 11/22/2019 15:47:34 -------------------------------------------------------------------------------- Vitals Details Patient Name: Date of Service: [name], [name] 11/21/2019 2:30 PM Medical Record [id number] Patient Account Number: [id number] Date of Birth/Sex: Treating RN: [birth year] (59 y.o. [name] Primary Care Tyanna Hach: [name] Other Clinician: Referring Clorine Swing: Treating Kavontae Pritchard/Extender:Stone III, Dayton Martes, Stacy Weeks in Treatment: 3 Vital Signs Time Taken: 15:17 Temperature (F): 97.7 Height (in): 73 Pulse (bpm): 90 Weight (lbs): 260 Respiratory Rate (breaths/min): 18 Body Mass Index (BMI): 34.3 Blood Pressure (mmHg): 133/68 Reference Range: 80 - 120 mg / dl Electronic Signature(s) Signed: 11/27/2019 5:30:42 PM By: Zandra Abts RN, BSN Entered By: Zandra Abts on 11/21/2019 15:17:40

## 2019-11-28 ENCOUNTER — Encounter (HOSPITAL_BASED_OUTPATIENT_CLINIC_OR_DEPARTMENT_OTHER): Payer: Medicare (Managed Care) | Attending: Physician Assistant | Admitting: Physician Assistant

## 2019-11-28 ENCOUNTER — Other Ambulatory Visit: Payer: Self-pay

## 2019-11-28 DIAGNOSIS — E11622 Type 2 diabetes mellitus with other skin ulcer: Secondary | ICD-10-CM | POA: Diagnosis not present

## 2019-11-28 NOTE — Progress Notes (Addendum)
[name] ([id number]) Visit Report for 11/28/2019 Chief Complaint Document Details Patient Name: Date of Service: [name], [name] 11/28/2019 2:30 PM Medical Record [id number] Patient Account Number: [id number] Date of Birth/Sex: Treating RN: Jan 21, 1961 (59 y.o. [name] Primary Care Provider: Billey Gosling Other Clinician: Referring Provider: Treating Provider/Extender:Stone III, Elby Showers, Nadine Counts in Treatment: 4 Information Obtained from: Patient Chief Complaint Bilateral foot diabetic ulcers Electronic Signature(s) Signed: 11/28/2019 3:10:30 PM By: Worthy Keeler PA-C Entered By: Worthy Keeler on 11/28/2019 15:10:30 -------------------------------------------------------------------------------- Debridement Details Patient Name: Date of Service: [name], [name] 11/28/2019 2:30 PM Medical Record [id number] Patient Account Number: [id number] Date of Birth/Sex: Treating RN: January 01, 1961 (59 y.o. [name] Primary Care Provider: Billey Gosling Other Clinician: Referring Provider: Treating Provider/Extender:Stone III, Elby Showers, Stacy Weeks in Treatment: 4 Debridement Performed for Wound #2 Right Metatarsal head second Assessment: Performed By: Physician Worthy Keeler, PA Debridement Type: Debridement Severity of Tissue Pre Fat layer exposed Debridement: Level of Consciousness (Pre- Awake and Alert procedure): Pre-procedure Verification/Time Out Taken: Yes - 15:40 Start Time: 15:41 Pain Control: Lidocaine 5% topical ointment Total Area Debrided (L x W): 3 (cm) x 2.4 (cm) = 7.2 (cm) Tissue and other material Viable, Non-Viable, Callus, Slough, Subcutaneous, Skin: Epidermis, Slough debrided: Level: Skin/Subcutaneous Tissue Debridement Description: Excisional Instrument: Curette Bleeding: Minimum Hemostasis Achieved: Pressure Procedural Pain: 0 Post Procedural Pain: 0 Response to Treatment: Procedure was tolerated well Level of  Consciousness Awake and Alert (Post-procedure): Post Debridement Measurements of Total Wound Length: (cm) 2.3 Width: (cm) 1.9 Depth: (cm) 1 Volume: (cm) 3.432 Character of Wound/Ulcer Post Improved Debridement: Severity of Tissue Post Debridement: Fat layer exposed Post Procedure Diagnosis Same as Pre-procedure Electronic Signature(s) Signed: 11/28/2019 5:39:41 PM By: Baruch Gouty RN, BSN Signed: 11/28/2019 6:32:46 PM By: Worthy Keeler PA-C Entered By: Baruch Gouty on 11/28/2019 15:44:57 -------------------------------------------------------------------------------- HPI Details Patient Name: Date of Service: [name], [name] 11/28/2019 2:30 PM Medical Record [id number] Patient Account Number: [id number] Date of Birth/Sex: Treating RN: 17-Aug-1961 (59 y.o. [name] Primary Care Provider: Billey Gosling Other Clinician: Referring Provider: Treating Provider/Extender:Stone III, Elby Showers, Stacy Weeks in Treatment: 4 History of Present Illness HPI Description: 10/31/2019 upon evaluation today patient appears to be doing somewhat poorly in regard to his bilateral plantar feet. He has wounds that he tells me have been present since 2012 intermittently off and on. Most recently this has been open for at least the past 6 months to a year. He has been trying to treat this in different ways using Santyl along with various other dressings including Medihoney and even at one point Xeroform. Nothing really has seem to get this completely closed. He was recently in the hospital for cellulitis of his leg subsequently he did have x-rays as well as MRIs that showed negative for any signs of osteomyelitis in regard to the wounds on his feet. Fortunately there is no signs of systemic infection at this time. No fevers, chills, nausea, vomiting, or diarrhea. Patient has previously used Darco offloading shoes as far as frontal floaters as well as postop surgical shoes. He has never  been in a total contact cast that may be something we need to strongly consider here. Patient's most recent hemoglobin A1c 1 month ago was 5.3 seems to be very well controlled which is great. Subsequently he has seen vascular as well as podiatry. His ABIs are 1.07 on the left and 1.14 on the right he seems to be doing well he does have chronic venous stasis.  11/07/2019 upon evaluation today patient appears to be doing well with regard to his wounds all things considered. I do not see any severe worsening he still has some callus buildup on the right more than the left he notes that he has been probably more active than he should as far as walking is concerned is just very hard to not be active. He knows he needs to be more careful in this regard however. He is willing to give the cast a try at this point although he notes that he is a little nervous about this just with regard to balance although he will be very careful and obviously if he has any trouble he knows to contact the office and let me know. 1/22; patient is in for his obligatory first total contact cast change. Our intake nurse reported a very large amount of drainage which is spelled out over to the surrounding skin. Has bilateral diabetic foot wounds. He has Charcot feet. We have been using silver alginate on his wounds. 11/14/2019 on evaluation today patient is actually seeming to make good improvement in regard to his bilateral plantar foot wounds. We have been using a cast on the left side and on the right side he has been using dressings he is changing up his own accord. With that being said he tells me that he is also not walking as much just due to how unsteady he feels. He takes it easy when he does have to walk and when he does not have to walk he is resting. This is probably help in his right foot as well has the left foot which is actually measuring better. In fact both are measuring better. Overall I am very pleased with how  things seem to be progressing. The patient does have some odor on the left foot this does have me concerned about the possibility of infection, and actually probably go ahead and put him on antibiotics today as well as utilizing a continuation of the cast on the left foot I think that will be fine we probably just need to bring him in sooner to change this not last a whole week. 1/29; we brought the patient back today for a total contact cast change on the left out of concern for excessive drainage. We are using drawtex over the wound as the primary dressing 11/21/2019 on evaluation today patient appears to be doing well with regard to his left plantar foot. In fact both foot ulcers actually seem to be doing pretty well. Nonetheless he is having a lot of drainage on the left at this time and again we did obtain a wound culture did show positive for Staph aureus that was reviewed by myself today as well. Nonetheless he is on Bactrim which was shown to be sensitive that should be helping in this regard. Fortunately there is no signs of infection systemically at this point. 2/5; back in clinic today for a total contact cast change apparently secondary to very significant drainage. Still using drawtex 11/28/2019 upon evaluation today patient appears to be doing well with regard to his wounds. The right foot is doing okay as measured about the same in my opinion. The left foot is actually showing signs of significant improvement is measuring smaller there is a lot of hyper granulation likely due to the continued drainage at this point. We did obtain approval for a snap VAC I think that is good to be appropriate for him and will likely help this tremendously  underneath the cast. He is definitely in agreement with proceeding with such. Electronic Signature(s) Signed: 11/28/2019 3:48:53 PM By: Lenda Kelp PA-C Entered By: Lenda Kelp on 11/28/2019  15:48:53 -------------------------------------------------------------------------------- Chemical Cauterization Details Patient Name: Date of Service: KINGSTEN, ENFIELD 11/28/2019 2:30 PM Medical Record [id number] Patient Account Number: [id number] Date of Birth/Sex: Treating RN: 1961-07-10 (59 y.o. [name] Primary Care Provider: [name] Other Clinician: Referring Provider: Treating Provider/Extender:Stone III, Dayton Martes, Duanne Limerick in Treatment: 4 Procedure Performed for: Wound #1 Left,Plantar Foot Performed By: Physician Lenda Kelp, PA Post Procedure Diagnosis Same as Pre-procedure Notes using silver nitrate stick Electronic Signature(s) Signed: 11/28/2019 5:39:41 PM By: Zenaida Deed RN, BSN Signed: 11/28/2019 6:32:46 PM By: Lenda Kelp PA-C Entered By: Zenaida Deed on 11/28/2019 15:43:11 -------------------------------------------------------------------------------- Physical Exam Details Patient Name: Date of Service: [name], [name] 11/28/2019 2:30 PM Medical Record [id number] Patient Account Number: [id number] Date of Birth/Sex: Treating RN: 05-Sep-1961 (59 y.o. [name] Primary Care Provider: [name] Other Clinician: Referring Provider: Treating Provider/Extender:Stone III, Dayton Martes, Stacy Weeks in Treatment: 4 Constitutional Well-nourished and well-hydrated in no acute distress. Respiratory normal breathing without difficulty. Psychiatric this patient is able to make decisions and demonstrates good insight into disease process. Alert and Oriented x 3. pleasant and cooperative. Notes Patient's wounds currently did require chemical cauterization in regard to the left plantar foot. Subsequently in regard to the right foot I did have to perform sharp debridement clear away from the necrotic debris which the patient tolerated without complication. Post debridement the wound bed appears to be doing much better which is  great news. Overall I am pleased with how things seem to be progressing. Electronic Signature(s) Signed: 11/28/2019 3:49:22 PM By: Lenda Kelp PA-C Entered By: Lenda Kelp on 11/28/2019 15:49:22 -------------------------------------------------------------------------------- Physician Orders Details Patient Name: Date of Service: EZIO, WIECK 11/28/2019 2:30 PM Medical Record [id number] Patient Account Number: [id number] Date of Birth/Sex: Treating RN: Mar 10, 1961 (59 y.o. [name] Primary Care Provider: [name] Other Clinician: Referring Provider: Treating Provider/Extender:Stone III, Dayton Martes, Duanne Limerick in Treatment: 4 Verbal / Phone Orders: No Diagnosis Coding ICD-10 Coding Code Description E11.622 Type 2 diabetes mellitus with other skin ulcer L97.522 Non-pressure chronic ulcer of other part of left foot with fat layer exposed L97.512 Non-pressure chronic ulcer of other part of right foot with fat layer exposed I10 Essential (primary) hypertension I50.42 Chronic combined systolic (congestive) and diastolic (congestive) heart failure I87.2 Venous insufficiency (chronic) (peripheral) L84 Corns and callosities Follow-up Appointments Return Appointment in 1 week. - Wed Dressing Change Frequency Wound #1 Left,Plantar Foot Do not change entire dressing for one week. Wound #2 Right Metatarsal head second Change Dressing every other day. Skin Barriers/Peri-Wound Care Wound #1 Left,Plantar Foot Skin Prep Wound Cleansing Wound #1 Left,Plantar Foot May shower with protection. - cast protector Wound #2 Right Metatarsal head second May shower and wash wound with soap and water. Primary Wound Dressing Wound #2 Right Metatarsal head second Calcium Alginate with Silver - pack into wound bed Secondary Dressing Wound #2 Right Metatarsal head second Kerlix/Rolled Gauze Dry Gauze Other: - felt callous pad Negative Presssure Wound Therapy Wound #1  Left,Plantar Foot SNAP Vac to wound continuously at 126mm/hg pressure Edema Control Patient to wear own compression stockings - daily to right leg Avoid standing for long periods of time Elevate legs to the level of the heart or above for 30 minutes daily and/or when sitting, a frequency of: Off-Loading Total Contact Cast to  Left Lower Extremity - foam padding between left great and 2nd toe Open toe surgical shoe to: - right foot daily Electronic Signature(s) Signed: 11/28/2019 5:39:41 PM By: Zenaida DeedBoehlein, Linda RN, BSN Signed: 11/28/2019 6:32:46 PM By: Lenda KelpStone III, Hoyt PA-C Entered By: Zenaida DeedBoehlein, Linda on 11/28/2019 15:47:28 -------------------------------------------------------------------------------- Problem List Details Patient Name: Date of Service: [name], Kallan 11/28/2019 2:30 PM Medical Record [id number] Patient Account Number: [id number] Date of Birth/Sex: Treating RN: 08/02/1961 (59 y.o. Damaris SchoonerM) Boehlein, Linda Primary Care Provider: Cheryll CockayneBurns, Stacy Other Clinician: Referring Provider: Treating Provider/Extender:Stone III, Dayton MartesHoyt Burns, Stacy Weeks in Treatment: 4 Active Problems ICD-10 Evaluated Encounter Code Description Active Date Today Diagnosis E11.622 Type 2 diabetes mellitus with other skin ulcer 10/31/2019 No Yes L97.522 Non-pressure chronic ulcer of other part of left foot 10/31/2019 No Yes with fat layer exposed L97.512 Non-pressure chronic ulcer of other part of right foot 10/31/2019 No Yes with fat layer exposed I10 Essential (primary) hypertension 10/31/2019 No Yes I50.42 Chronic combined systolic (congestive) and diastolic 10/31/2019 No Yes (congestive) heart failure I87.2 Venous insufficiency (chronic) (peripheral) 10/31/2019 No Yes L84 Corns and callosities 11/14/2019 No Yes Inactive Problems Resolved Problems Electronic Signature(s) Signed: 11/28/2019 3:10:24 PM By: Lenda KelpStone III, Hoyt PA-C Entered By: Lenda KelpStone III, Hoyt on 11/28/2019  15:10:23 -------------------------------------------------------------------------------- Progress Note Details Patient Name: Date of Service: [name], Evertte 11/28/2019 2:30 PM Medical Record [id number] Patient Account Number: [id number] Date of Birth/Sex: Treating RN: 11/29/1960 (59 y.o. Damaris SchoonerM) Boehlein, Linda Primary Care Provider: Cheryll CockayneBurns, Stacy Other Clinician: Referring Provider: Treating Provider/Extender:Stone III, Dayton MartesHoyt Burns, Stacy Weeks in Treatment: 4 Subjective Chief Complaint Information obtained from Patient Bilateral foot diabetic ulcers History of Present Illness (HPI) 10/31/2019 upon evaluation today patient appears to be doing somewhat poorly in regard to his bilateral plantar feet. He has wounds that he tells me have been present since 2012 intermittently off and on. Most recently this has been open for at least the past 6 months to a year. He has been trying to treat this in different ways using Santyl along with various other dressings including Medihoney and even at one point Xeroform. Nothing really has seem to get this completely closed. He was recently in the hospital for cellulitis of his leg subsequently he did have x-rays as well as MRIs that showed negative for any signs of osteomyelitis in regard to the wounds on his feet. Fortunately there is no signs of systemic infection at this time. No fevers, chills, nausea, vomiting, or diarrhea. Patient has previously used Darco offloading shoes as far as frontal floaters as well as postop surgical shoes. He has never been in a total contact cast that may be something we need to strongly consider here. Patient's most recent hemoglobin A1c 1 month ago was 5.3 seems to be very well controlled which is great. Subsequently he has seen vascular as well as podiatry. His ABIs are 1.07 on the left and 1.14 on the right he seems to be doing well he does have chronic venous stasis. 11/07/2019 upon evaluation today patient appears  to be doing well with regard to his wounds all things considered. I do not see any severe worsening he still has some callus buildup on the right more than the left he notes that he has been probably more active than he should as far as walking is concerned is just very hard to not be active. He knows he needs to be more careful in this regard however. He is willing to give the cast a try at this point although he  notes that he is a little nervous about this just with regard to balance although he will be very careful and obviously if he has any trouble he knows to contact the office and let me know. 1/22; patient is in for his obligatory first total contact cast change. Our intake nurse reported a very large amount of drainage which is spelled out over to the surrounding skin. Has bilateral diabetic foot wounds. He has Charcot feet. We have been using silver alginate on his wounds. 11/14/2019 on evaluation today patient is actually seeming to make good improvement in regard to his bilateral plantar foot wounds. We have been using a cast on the left side and on the right side he has been using dressings he is changing up his own accord. With that being said he tells me that he is also not walking as much just due to how unsteady he feels. He takes it easy when he does have to walk and when he does not have to walk he is resting. This is probably help in his right foot as well has the left foot which is actually measuring better. In fact both are measuring better. Overall I am very pleased with how things seem to be progressing. The patient does have some odor on the left foot this does have me concerned about the possibility of infection, and actually probably go ahead and put him on antibiotics today as well as utilizing a continuation of the cast on the left foot I think that will be fine we probably just need to bring him in sooner to change this not last a whole week. 1/29; we brought the  patient back today for a total contact cast change on the left out of concern for excessive drainage. We are using drawtex over the wound as the primary dressing 11/21/2019 on evaluation today patient appears to be doing well with regard to his left plantar foot. In fact both foot ulcers actually seem to be doing pretty well. Nonetheless he is having a lot of drainage on the left at this time and again we did obtain a wound culture did show positive for Staph aureus that was reviewed by myself today as well. Nonetheless he is on Bactrim which was shown to be sensitive that should be helping in this regard. Fortunately there is no signs of infection systemically at this point. 2/5; back in clinic today for a total contact cast change apparently secondary to very significant drainage. Still using drawtex 11/28/2019 upon evaluation today patient appears to be doing well with regard to his wounds. The right foot is doing okay as measured about the same in my opinion. The left foot is actually showing signs of significant improvement is measuring smaller there is a lot of hyper granulation likely due to the continued drainage at this point. We did obtain approval for a snap VAC I think that is good to be appropriate for him and will likely help this tremendously underneath the cast. He is definitely in agreement with proceeding with such. Objective Constitutional Well-nourished and well-hydrated in no acute distress. Vitals Time Taken: 3:02 PM, Height: 73 in, Weight: 260 lbs, BMI: 34.3, Temperature: 97.7 F, Pulse: 76 bpm, Respiratory Rate: 20 breaths/min, Blood Pressure: 135/68 mmHg. Respiratory normal breathing without difficulty. Psychiatric this patient is able to make decisions and demonstrates good insight into disease process. Alert and Oriented x 3. pleasant and cooperative. General Notes: Patient's wounds currently did require chemical cauterization in regard to the left  plantar  foot. Subsequently in regard to the right foot I did have to perform sharp debridement clear away from the necrotic debris which the patient tolerated without complication. Post debridement the wound bed appears to be doing much better which is great news. Overall I am pleased with how things seem to be progressing. Integumentary (Hair, Skin) Wound #1 status is Open. Original cause of wound was Gradually Appeared. The wound is located on the Left,Plantar Foot. The wound measures 2.5cm length x 2.8cm width x 0.1cm depth; 5.498cm^2 area and 0.55cm^3 volume. There is Fat Layer (Subcutaneous Tissue) Exposed exposed. There is no tunneling or undermining noted. There is a large amount of serosanguineous drainage noted. The wound margin is flat and intact. There is large (67- 100%) pink granulation within the wound bed. There is a small (1-33%) amount of necrotic tissue within the wound bed including Adherent Slough. Wound #2 status is Open. Original cause of wound was Gradually Appeared. The wound is located on the Right Metatarsal head second. The wound measures 2.3cm length x 1.9cm width x 1cm depth; 3.432cm^2 area and 3.432cm^3 volume. There is Fat Layer (Subcutaneous Tissue) Exposed exposed. There is no tunneling noted, however, there is undermining starting at 3:00 and ending at 6:00 with a maximum distance of 0.5cm. There is a medium amount of serosanguineous drainage noted. The wound margin is flat and intact. There is large (67-100%) pink granulation within the wound bed. There is a small (1-33%) amount of necrotic tissue within the wound bed including Adherent Slough. 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 There was a Excisional Skin/Subcutaneous Tissue Debridement with a total area of 7.2 sq cm performed by Lenda Kelp, PA. With the following instrument(s): Curette to remove Viable and Non-Viable tissue/material. Material removed includes Callus, Subcutaneous Tissue, Slough, and Skin: Epidermis after achieving pain control using Lidocaine 5% topical ointment. No specimens were taken. A time out was conducted at 15:40, prior to the start of the procedure. A Minimum amount of bleeding was controlled with Pressure. The procedure was tolerated well with a pain level of 0 throughout and a pain level of 0 following the procedure. Post Debridement Measurements: 2.3cm length x 1.9cm width x 1cm depth; 3.432cm^3 volume. Character of Wound/Ulcer Post Debridement is improved. Severity of Tissue Post Debridement is: Fat layer exposed. Post procedure Diagnosis Wound #2: Same as Pre-Procedure Wound #1 Pre-procedure diagnosis of Wound #1 is a Diabetic Wound/Ulcer of the Lower Extremity located on the Left,Plantar Foot . There was a Total Contact Cast Procedure by Lenda Kelp, PA. Post procedure Diagnosis Wound #1: Same as Pre-Procedure Pre-procedure diagnosis of Wound #1 is a Diabetic Wound/Ulcer of the Lower Extremity located on the Left,Plantar Foot . An Chemical Cauterization procedure was performed by Lenda Kelp, PA. Post procedure Diagnosis Wound #1: Same as Pre-Procedure Notes: using silver nitrate stick Plan Follow-up Appointments: Return Appointment in 1 week. - Wed Dressing Change Frequency: Wound #1 Left,Plantar Foot: Do not change entire dressing for one week. Wound #2 Right  Metatarsal head second: Change Dressing every other day. Skin Barriers/Peri-Wound Care: Wound #1 Left,Plantar Foot: Skin Prep Wound Cleansing: Wound #1 Left,Plantar Foot: May shower with protection. - cast protector Wound #2 Right Metatarsal head second: May shower and wash wound with soap and water. Primary Wound Dressing: Wound #2 Right Metatarsal head second: Calcium Alginate with Silver - pack into wound bed Secondary Dressing: Wound #2 Right Metatarsal head second: Kerlix/Rolled Gauze Dry Gauze Other: - felt callous pad Negative Presssure Wound Therapy: Wound #1 Left,Plantar Foot: SNAP Vac to wound continuously at 15725mm/hg pressure Edema Control: Patient to wear own compression stockings - daily to right leg Avoid standing for long periods of time Elevate legs to the level of the heart or above for 30 minutes daily and/or when sitting, a frequency of: Off-Loading: Total Contact Cast to Left Lower Extremity - foam padding between left great and 2nd toe Open toe surgical shoe to: - right foot daily 1. My suggestion at this time is good to be that we go and continue with the current wound care measures specifically with regard to the total contact cast for the left foot he has done extremely well with this. We will add the snap VAC underneath the cast in order to manage the drainage. 2. I would recommend that we continue with the silver alginate dressing packed into the wound bed of the right plantar foot. The patient is in agreement with that plan. 3. I did perform chemical cauterization with silver nitrate of the hyper granular tissue of the left plantar foot prior to application of the snap VAC to try to help out with more appropriate healing and epithelization as well. We will see patient back for reevaluation in 1 week here in the clinic. If anything worsens or changes patient will contact our office for additional recommendations. Electronic Signature(s) Signed:  11/28/2019 3:50:22 PM By: Lenda KelpStone III, Hoyt PA-C Entered By: Lenda KelpStone III, Hoyt on 11/28/2019 15:50:22 -------------------------------------------------------------------------------- Total Contact Cast Details Patient Name: Date of Service: [name], [name] 11/28/2019 2:30 PM Medical Record [id number] Patient Account Number: [id number] Date of Birth/Sex: Treating RN: 06/21/1961 (59 y.o. Damaris SchoonerM) Boehlein, Linda Primary Care Provider: Cheryll CockayneBurns, Stacy Other Clinician: Referring Provider: Treating Provider/Extender:Stone III, Dayton MartesHoyt Burns, Duanne LimerickStacy Weeks in Treatment: 4 Total Contact Cast Applied for Wound Assessment: Wound #1 Left,Plantar Foot Performed By: Physician Lenda KelpStone III, Hoyt, PA Post Procedure Diagnosis Same as Pre-procedure Electronic Signature(s) Signed: 11/28/2019 5:39:41 PM By: Zenaida DeedBoehlein, Linda RN, BSN Signed: 11/28/2019 6:32:46 PM By: Lenda KelpStone III, Hoyt PA-C Entered By: Zenaida DeedBoehlein, Linda on 11/28/2019 15:40:31 -------------------------------------------------------------------------------- SuperBill Details Patient Name: Date of Service: [name], [name] 11/28/2019 Medical Record [id number] Patient Account Number: [id number] Date of Birth/Sex: Treating RN: 09/10/1961 (59 y.o. 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 I10 Essential (primary) hypertension I50.42 Chronic combined systolic (congestive) and diastolic (congestive) heart failure I87.2 Venous insufficiency (chronic) (peripheral) L84 Corns and callosities Facility Procedures CPT4 Code Description: [id number] 11042 - DEB SUBQ TISSUE 20 SQ  CM/< ICD-10 Diagnosis Description L97.512 Non-pressure chronic ulcer of other part of right foot wit Modifier: h fat layer ex Quantity: 1 posed CPT4 Code Description: 9528413276100313 97607 NEG PRESS WND TX <=50 SQ CM Modifier:  59 Quantity: 1 CPT4 Code Description: [id number] 29445 - APPLY TOTAL CONTACT LEG CAST ICD-10 Diagnosis Description L97.522 Non-pressure chronic ulcer of other part of left foot with Modifier: 59 fat layer exp Quantity: 1 osed CPT4 Code Description: 54098119 17250 - CHEM CAUT GRANULATION TISS ICD-10 Diagnosis Description L97.522 Non-pressure chronic ulcer of other part of left foot with Modifier: 59 fat layer exp Quantity: 1 osed Physician Procedures CPT4 Code Description: [id number] 11042 - WC PHYS SUBQ TISS 20 SQ CM ICD-10 Diagnosis Description L97.512 Non-pressure chronic ulcer of other part of right foot wit Modifier: h fat layer ex Quantity: 1 posed CPT4 Code Description: [id number] 57846 - WC PHYS APPLY TOTAL CONTACT CAST ICD-10 Diagnosis Description L97.522 Non-pressure chronic ulcer of other part of left foot with Modifier: 59 fat layer exp Quantity: 1 osed CPT4 Code Description: 9629528 17250 - WC PHYS CHEM CAUT GRAN TISSUE ICD-10 Diagnosis Description L97.522 Non-pressure chronic ulcer of other part of left foot with Modifier: 59 fat layer exp Quantity: 1 osed Electronic Signature(s) Signed: 12/03/2019 6:14:31 PM By: Zandra Abts RN, BSN Signed: 12/06/2019 12:37:44 PM By: Lenda Kelp PA-C Previous Signature: 11/28/2019 4:09:51 PM Version By: Lenda Kelp PA-C Entered By: Zandra Abts on 11/29/2019 09:06:58

## 2019-11-30 ENCOUNTER — Other Ambulatory Visit: Payer: Self-pay

## 2019-11-30 ENCOUNTER — Encounter (HOSPITAL_BASED_OUTPATIENT_CLINIC_OR_DEPARTMENT_OTHER): Payer: Medicare (Managed Care) | Admitting: Internal Medicine

## 2019-11-30 DIAGNOSIS — E11622 Type 2 diabetes mellitus with other skin ulcer: Secondary | ICD-10-CM | POA: Diagnosis not present

## 2019-12-03 ENCOUNTER — Encounter (HOSPITAL_BASED_OUTPATIENT_CLINIC_OR_DEPARTMENT_OTHER): Payer: Medicare (Managed Care) | Admitting: Internal Medicine

## 2019-12-03 ENCOUNTER — Other Ambulatory Visit: Payer: Self-pay

## 2019-12-03 DIAGNOSIS — E11622 Type 2 diabetes mellitus with other skin ulcer: Secondary | ICD-10-CM | POA: Diagnosis not present

## 2019-12-03 NOTE — Progress Notes (Signed)
[name], [name] ([id number]) Visit Report for 12/03/2019 HPI Details Patient Name: Date of Service: [name], [name] 12/03/2019 1:45 PM Medical Record [id number] Patient Account Number: [id number] Date of Birth/Sex: Treating RN: 07/08/1961 (59 y.o. [name] Primary Care Provider: Cheryll [name] Other Clinician: Referring Provider: Treating Provider/Extender:[name], [name], [name] in Treatment: 4 History of Present Illness HPI Description: 10/31/2019 upon evaluation today patient appears to be doing somewhat poorly in regard to his bilateral plantar feet. He has wounds that he tells me have been present since 2012 intermittently off and on. Most recently this has been open for at least the past 6 months to a year. He has been trying to treat this in different ways using Santyl along with various other dressings including Medihoney and even at one point Xeroform. Nothing really has seem to get this completely closed. He was recently in the hospital for cellulitis of his leg subsequently he did have x-rays as well as MRIs that showed negative for any signs of osteomyelitis in regard to the wounds on his feet. Fortunately there is no signs of systemic infection at this time. No fevers, chills, nausea, vomiting, or diarrhea. Patient has previously used Darco offloading shoes as far as frontal floaters as well as postop surgical shoes. He has never been in a total contact cast that may be something we need to strongly consider here. Patient's most recent hemoglobin A1c 1 month ago was 5.3 seems to be very well controlled which is great. Subsequently he has seen vascular as well as podiatry. His ABIs are 1.07 on the left and 1.14 on the right he seems to be doing well he does have chronic venous stasis. 11/07/2019 upon evaluation today patient appears to be doing well with regard to his wounds all things considered. I do not see any severe worsening he still has some callus buildup  on the right more than the left he notes that he has been probably more active than he should as far as walking is concerned is just very hard to not be active. He knows he needs to be more careful in this regard however. He is willing to give the cast a try at this point although he notes that he is a little nervous about this just with regard to balance although he will be very careful and obviously if he has any trouble he knows to contact the office and let me know. 1/22; patient is in for his obligatory first total contact cast change. Our intake nurse reported a very large amount of drainage which is spelled out over to the surrounding skin. Has bilateral diabetic foot wounds. He has Charcot feet. We have been using silver alginate on his wounds. 11/14/2019 on evaluation today patient is actually seeming to make good improvement in regard to his bilateral plantar foot wounds. We have been using a cast on the left side and on the right side he has been using dressings he is changing up his own accord. With that being said he tells me that he is also not walking as much just due to how unsteady he feels. He takes it easy when he does have to walk and when he does not have to walk he is resting. This is probably help in his right foot as well has the left foot which is actually measuring better. In fact both are measuring better. Overall I am very pleased with how things seem to be progressing. The patient does have some odor on the  left foot this does have me concerned about the possibility of infection, and actually probably go ahead and put him on antibiotics today as well as utilizing a continuation of the cast on the left foot I think that will be fine we probably just need to bring him in sooner to change this not last a whole week. 1/29; we brought the patient back today for a total contact cast change on the left out of concern for excessive drainage. We are using drawtex over the wound  as the primary dressing 11/21/2019 on evaluation today patient appears to be doing well with regard to his left plantar foot. In fact both foot ulcers actually seem to be doing pretty well. Nonetheless he is having a lot of drainage on the left at this time and again we did obtain a wound culture did show positive for Staph aureus that was reviewed by myself today as well. Nonetheless he is on Bactrim which was shown to be sensitive that should be helping in this regard. Fortunately there is no signs of infection systemically at this point. 2/5; back in clinic today for a total contact cast change apparently secondary to very significant drainage. Still using drawtex 11/28/2019 upon evaluation today patient appears to be doing well with regard to his wounds. The right foot is doing okay as measured about the same in my opinion. The left foot is actually showing signs of significant improvement is measuring smaller there is a lot of hyper granulation likely due to the continued drainage at this point. We did obtain approval for a snap VAC I think that is good to be appropriate for him and will likely help this tremendously underneath the cast. He is definitely in agreement with proceeding with such. 2/12; patient came in today after his snap VAC lost suction. Brought in to see one of our nurses. The dressing was replaced and then we put the cast back on and rehooked up the snap VAC. Apparently his wound looked very good per our intake nurse. 2/15; again we replaced the cast on Friday. By Saturday the snap VAC and light suction. He called this morning he comes in acutely. The wounds look fine however the VAC is not functioning. We replaced the cast using silver alginate as the primary dressing backed with Kerramax. The snap VAC was not replaced Electronic Signature(s) Signed: 12/03/2019 6:16:05 PM By: [name] Najjar MD Entered By: [name] on 12/03/2019  15:32:48 -------------------------------------------------------------------------------- Physical Exam Details Patient Name: Date of Service: [name], [name] 12/03/2019 1:45 PM Medical Record [id number] Patient Account Number: [id number] Date of Birth/Sex: Treating RN: 06/18/1961 (59 y.o. [name] Primary Care Provider: Cheryll [name] Other Clinician: Referring Provider: Treating Provider/Extender:Laniqua Torrens, [name], [name] in Treatment: 4 Notes Wound exam; our intake nurse noted that the wound looked good but the VAC was nonfunctional. We replaced the total contact cast in the standard manner with silver alginate as the primary dressing Electronic Signature(s) Signed: 12/03/2019 6:16:05 PM By: [name] Najjar MD Entered By: [name] on 12/03/2019 15:33:46 -------------------------------------------------------------------------------- Physician Orders Details Patient Name: Date of Service: [name], [name] 12/03/2019 1:45 PM Medical Record [id number] Patient Account Number: [id number] Date of Birth/Sex: Treating RN: 12-06-1960 (59 y.o. [name] Primary Care Provider: Cheryll [name] Other Clinician: Referring Provider: Treating Provider/Extender:Frieda Arnall, [name], [name] in Treatment: 4 Verbal / Phone Orders: No Diagnosis Coding ICD-10 Coding Code Description E11.622 Type 2 diabetes mellitus with other skin ulcer L97.522 Non-pressure chronic ulcer of other part of left  foot with fat layer exposed L97.512 Non-pressure chronic ulcer of other part of right foot with fat layer exposed I10 Essential (primary) hypertension I50.42 Chronic combined systolic (congestive) and diastolic (congestive) heart failure I87.2 Venous insufficiency (chronic) (peripheral) L84 Corns and callosities Follow-up Appointments Return Appointment in: - Keep appt for Wednesday 2/17 Dressing Change Frequency Wound #1 Left,Plantar Foot Do not change entire  dressing for one week. Wound #2 Right Metatarsal head second Change Dressing every other day. Skin Barriers/Peri-Wound Care Wound #1 Left,Plantar Foot Skin Prep Wound Cleansing Wound #1 Left,Plantar Foot May shower with protection. - cast protector Wound #2 Right Metatarsal head second May shower and wash wound with soap and water. Primary Wound Dressing Wound #1 Left,Plantar Foot Calcium Alginate with Silver Wound #2 Right Metatarsal head second Calcium Alginate with Silver - pack into wound bed Secondary Dressing Wound #2 Right Metatarsal head second Kerlix/Rolled Gauze Dry Gauze Other: - felt callous pad Wound #1 Left,Plantar Foot Zetuvit or Kerramax Carboflex Negative Presssure Wound Therapy Wound #1 Left,Plantar Foot SNAP Vac to wound continuously at 170mm/hg pressure - Hold SNAP until seen on Wednesday Edema Control Patient to wear own compression stockings - daily to right leg Avoid standing for long periods of time Elevate legs to the level of the heart or above for 30 minutes daily and/or when sitting, a frequency of: Off-Loading Total Contact Cast to Left Lower Extremity - foam padding between left great and 2nd toe Open toe surgical shoe to: - right foot daily Electronic Signature(s) Signed: 12/03/2019 6:14:31 PM By: [name] RN, BSN Signed: 12/03/2019 6:16:05 PM By: Linton Ham MD Entered By: [name] on 12/03/2019 14:33:09 -------------------------------------------------------------------------------- Problem List Details Patient Name: Date of Service: [name], [name] 12/03/2019 1:45 PM Medical Record [id number] Patient Account Number: [id number] Date of Birth/Sex: Treating RN: 1961/03/27 (59 y.o. [name] Primary Care Provider: Billey Gosling Other Clinician: Referring Provider: Treating Provider/Extender:Ilija Maxim, Baird Lyons, Nadine Counts in Treatment: 4 Active Problems ICD-10 Evaluated Encounter Code Description Active  Date Today Diagnosis E11.622 Type 2 diabetes mellitus with other skin ulcer 10/31/2019 No Yes L97.522 Non-pressure chronic ulcer of other part of left foot 10/31/2019 No Yes with fat layer exposed L97.512 Non-pressure chronic ulcer of other part of right foot 10/31/2019 No Yes with fat layer exposed I10 Essential (primary) hypertension 10/31/2019 No Yes I50.42 Chronic combined systolic (congestive) and diastolic 01/08/5572 No Yes (congestive) heart failure I87.2 Venous insufficiency (chronic) (peripheral) 10/31/2019 No Yes L84 Corns and callosities 11/14/2019 No Yes Inactive Problems Resolved Problems Electronic Signature(s) Signed: 12/03/2019 6:16:05 PM By: Linton Ham MD Entered By: Linton Ham on 12/03/2019 15:31:25 -------------------------------------------------------------------------------- Progress Note Details Patient Name: Date of Service: [name], [name] 12/03/2019 1:45 PM Medical Record [id number] Patient Account Number: [id number] Date of Birth/Sex: Treating RN: [birth year] (59 y.o. [name] Primary Care Provider: Billey Gosling Other Clinician: Referring Provider: Treating Provider/Extender:Daymien Goth, Baird Lyons, Nadine Counts in Treatment: 4 Subjective History of Present Illness (HPI) 10/31/2019 upon evaluation today patient appears to be doing somewhat poorly in regard to his bilateral plantar feet. He has wounds that he tells me have been present since 2012 intermittently off and on. Most recently this has been open for at least the past 6 months to a year. He has been trying to treat this in different ways using Santyl along with various other dressings including Medihoney and even at one point Xeroform. Nothing really has seem to get this completely closed. He was recently in the hospital for cellulitis of his leg subsequently he  did have x-rays as well as MRIs that showed negative for any signs of osteomyelitis in regard to the wounds on his feet.  Fortunately there is no signs of systemic infection at this time. No fevers, chills, nausea, vomiting, or diarrhea. Patient has previously used Darco offloading shoes as far as frontal floaters as well as postop surgical shoes. He has never been in a total contact cast that may be something we need to strongly consider here. Patient's most recent hemoglobin A1c 1 month ago was 5.3 seems to be very well controlled which is great. Subsequently he has seen vascular as well as podiatry. His ABIs are 1.07 on the left and 1.14 on the right he seems to be doing well he does have chronic venous stasis. 11/07/2019 upon evaluation today patient appears to be doing well with regard to his wounds all things considered. I do not see any severe worsening he still has some callus buildup on the right more than the left he notes that he has been probably more active than he should as far as walking is concerned is just very hard to not be active. He knows he needs to be more careful in this regard however. He is willing to give the cast a try at this point although he notes that he is a little nervous about this just with regard to balance although he will be very careful and obviously if he has any trouble he knows to contact the office and let me know. 1/22; patient is in for his obligatory first total contact cast change. Our intake nurse reported a very large amount of drainage which is spelled out over to the surrounding skin. Has bilateral diabetic foot wounds. He has Charcot feet. We have been using silver alginate on his wounds. 11/14/2019 on evaluation today patient is actually seeming to make good improvement in regard to his bilateral plantar foot wounds. We have been using a cast on the left side and on the right side he has been using dressings he is changing up his own accord. With that being said he tells me that he is also not walking as much just due to how unsteady he feels. He takes it easy when  he does have to walk and when he does not have to walk he is resting. This is probably help in his right foot as well has the left foot which is actually measuring better. In fact both are measuring better. Overall I am very pleased with how things seem to be progressing. The patient does have some odor on the left foot this does have me concerned about the possibility of infection, and actually probably go ahead and put him on antibiotics today as well as utilizing a continuation of the cast on the left foot I think that will be fine we probably just need to bring him in sooner to change this not last a whole week. 1/29; we brought the patient back today for a total contact cast change on the left out of concern for excessive drainage. We are using drawtex over the wound as the primary dressing 11/21/2019 on evaluation today patient appears to be doing well with regard to his left plantar foot. In fact both foot ulcers actually seem to be doing pretty well. Nonetheless he is having a lot of drainage on the left at this time and again we did obtain a wound culture did show positive for Staph aureus that was reviewed by myself today as well.  Nonetheless he is on Bactrim which was shown to be sensitive that should be helping in this regard. Fortunately there is no signs of infection systemically at this point. 2/5; back in clinic today for a total contact cast change apparently secondary to very significant drainage. Still using drawtex 11/28/2019 upon evaluation today patient appears to be doing well with regard to his wounds. The right foot is doing okay as measured about the same in my opinion. The left foot is actually showing signs of significant improvement is measuring smaller there is a lot of hyper granulation likely due to the continued drainage at this point. We did obtain approval for a snap VAC I think that is good to be appropriate for him and will likely help this tremendously underneath  the cast. He is definitely in agreement with proceeding with such. 2/12; patient came in today after his snap VAC lost suction. Brought in to see one of our nurses. The dressing was replaced and then we put the cast back on and rehooked up the snap VAC. Apparently his wound looked very good per our intake nurse. 2/15; again we replaced the cast on Friday. By Saturday the snap VAC and light suction. He called this morning he comes in acutely. The wounds look fine however the VAC is not functioning. We replaced the cast using silver alginate as the primary dressing backed with Kerramax. The snap VAC was not replaced Objective Constitutional Vitals Time Taken: 2:00 PM, Height: 73 in, Weight: 260 lbs, BMI: 34.3, Temperature: 98.1 F, Pulse: 82 bpm, Respiratory Rate: 18 breaths/min, Blood Pressure: 166/83 mmHg. Integumentary (Hair, Skin) Wound #1 status is Open. Original cause of wound was Gradually Appeared. The wound is located on the Left,Plantar Foot. The wound measures 2.5cm length x 2.8cm width x 0.1cm depth; 5.498cm^2 area and 0.55cm^3 volume. There is Fat Layer (Subcutaneous Tissue) Exposed exposed. There is no tunneling or undermining noted. There is a medium amount of serosanguineous drainage noted. The wound margin is flat and intact. There is large (67-100%) pink, pale granulation within the wound bed. There is no necrotic tissue within the wound bed. Wound #2 status is Open. Original cause of wound was Gradually Appeared. The wound is located on the Right Metatarsal head second. The wound measures 2.3cm length x 1.9cm width x 1cm depth; 3.432cm^2 area and 3.432cm^3 volume. There is Fat Layer (Subcutaneous Tissue) Exposed exposed. There is no tunneling or undermining noted. There is a medium amount of serosanguineous drainage noted. The wound margin is flat and intact. There is large (67-100%) pink granulation within the wound bed. There is a small (1-33%) amount of necrotic tissue  within the wound bed including Adherent Slough. 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 There was a Total Contact Cast Procedure by Maxwell Caul., MD. Post procedure Diagnosis Wound #1: Same as Pre-Procedure Plan Follow-up Appointments: Return Appointment in: - Keep appt for Wednesday 2/17 Dressing Change Frequency: Wound #1 Left,Plantar Foot: Do not change entire dressing for one week. Wound #2 Right Metatarsal head second: Change Dressing every other day. Skin Barriers/Peri-Wound Care:  Wound #1 Left,Plantar Foot: Skin Prep Wound Cleansing: Wound #1 Left,Plantar Foot: May shower with protection. - cast protector Wound #2 Right Metatarsal head second: May shower and wash wound with soap and water. Primary Wound Dressing: Wound #1 Left,Plantar Foot: Calcium Alginate with Silver Wound #2 Right Metatarsal head second: Calcium Alginate with Silver - pack into wound bed Secondary Dressing: Wound #2 Right Metatarsal head second: Kerlix/Rolled Gauze Dry Gauze Other: - felt callous pad Wound #1 Left,Plantar Foot: Zetuvit or Kerramax Carboflex Negative Presssure Wound Therapy: Wound #1 Left,Plantar Foot: SNAP Vac to wound continuously at 19325mm/hg pressure - Hold SNAP until seen on Wednesday Edema Control: Patient to wear own compression stockings - daily to right leg Avoid standing for long periods of time Elevate legs to the level of the heart or above for 30  minutes daily and/or when sitting, a frequency of: Off-Loading: Total Contact Cast to Left Lower Extremity - foam padding between left great and 2nd toe Open toe surgical shoe to: - right foot daily 1. Silver alginate packed with Kerramax. Total contact cast replaced 2. The patient will be back in follow-up with his usual. Team on Wednesday. It does not seem that the snap VAC is able to hold in this situation Electronic Signature(s) Signed: 12/03/2019 6:16:05 PM By: [name] Najjarobson, Engelbert Sevin MD Entered By: [name] Najjarobson, Dorna Mallet on 12/03/2019 15:34:42 -------------------------------------------------------------------------------- Total Contact Cast Details Patient Name: Date of Service: [name], [name] 12/03/2019 1:45 PM Medical Record [id number] Patient Account Number: [id number] Date of Birth/Sex: Treating RN: 01/01/1961 (59 y.o. [name] KollerM) Lynch, Shatara Primary Care Provider: Cheryll CockayneBurns, Stacy Other Clinician: Referring Provider: Treating Provider/Extender:Tannor Pyon, [name] IbaMichael Burns, [name] LimerickStacy Weeks in Treatment: 4 Total Contact Cast Applied for Wound Assessment: Wound #1 Left,Plantar Foot Performed By: Physician Maxwell Caulobson, Kaileena Obi G., MD Post Procedure Diagnosis Same as Pre-procedure Electronic Signature(s) Signed: 12/03/2019 6:14:31 PM By: Zandra AbtsLynch, Shatara RN, BSN Signed: 12/03/2019 6:16:05 PM By: [name] Najjarobson, Pahola Dimmitt MD Entered By: Zandra AbtsLynch, Shatara on 12/03/2019 15:14:24 -------------------------------------------------------------------------------- SuperBill Details Patient Name: Date of Service: [name], [name] 12/03/2019 Medical Record [id number] Patient Account Number: [id number] Date of Birth/Sex: Treating RN: 09/22/1961 (59 y.o. [name] KollerM) Lynch, Shatara Primary Care Provider: Cheryll CockayneBurns, Stacy Other Clinician: Referring Provider: Treating Provider/Extender:Darielle Hancher, [name] IbaMichael Burns, [name] LimerickStacy Weeks in Treatment: 4 Diagnosis Coding ICD-10 Codes Code Description E11.622 Type 2 diabetes mellitus with other skin ulcer L97.522  Non-pressure chronic ulcer of other part of left foot with fat layer exposed L97.512 Non-pressure chronic ulcer of other part of right foot with fat layer exposed I10 Essential (primary) hypertension I50.42 Chronic combined systolic (congestive) and diastolic (congestive) heart failure I87.2 Venous insufficiency (chronic) (peripheral) L84 Corns and callosities Facility Procedures CPT4 Code Description: [id number] - APPLY TOTAL CONTACT LEG CAST ICD-10 Diagnosis Description L97.522 Non-pressure chronic ulcer of other part of left foot with Modifier: fat layer exp Quantity: 1 osed Physician Procedures CPT4 Code Description: [id number] - WC PHYS APPLY TOTAL CONTACT CAST ICD-10 Diagnosis Description L97.522 Non-pressure chronic ulcer of other part of left foot with Modifier: fat layer exp Quantity: 1 osed Electronic Signature(s) Signed: 12/03/2019 6:16:05 PM By: [name] Najjarobson, Romeo Zielinski MD Entered By: [name] Najjarobson, Benuel Ly on 12/03/2019 15:35:19

## 2019-12-03 NOTE — Progress Notes (Signed)
[name] ([id number]) Visit Report for 12/03/2019 Arrival Information Details Patient Name: Date of Service: [name], [name] 12/03/2019 1:45 PM Medical Record [id number] Patient Account Number: [id number] Date of Birth/Sex: Treating RN: 1961/07/01 (59 y.o. [name] Primary Care [name]: Billey Gosling Other Clinician: Referring Waris Rodger: Treating Alyssabeth Bruster/Extender:Robson, Baird Lyons, Nadine Counts in Treatment: 4 Visit Information History Since Last Visit Added or deleted any medications: No Patient Arrived: [name] Any new allergies or adverse reactions: No Arrival Time: 14:10 Had a fall or experienced change in No Accompanied By: self activities of daily living that may affect Transfer Assistance: None risk of falls: Patient Identification Verified: Yes Signs or symptoms of abuse/neglect since No Secondary Verification Process Yes last visito Completed: Hospitalized since last visit: No Patient Requires Transmission-Based No Implantable device outside of the clinic No Precautions: excluding Patient Has Alerts: Yes cellular tissue based products placed in Patient Alerts: L ABI 1.07 the center (04/2019) since last visit: R ABI 1.14 Has Dressing in Place as Prescribed: Yes (04/2019) Has Footwear/Offloading in Place as Yes Prescribed: Left: Total Contact Cast Pain Present Now: No Electronic Signature(s) Signed: 12/03/2019 6:12:41 PM By: Kela Millin Entered By: Kela Millin on 12/03/2019 14:50:10 -------------------------------------------------------------------------------- Encounter Discharge Information Details Patient Name: Date of Service: [name], [name] 12/03/2019 1:45 PM Medical Record [id number] Patient Account Number: [id number] Date of Birth/Sex: Treating RN: [birth year] (59 y.o. [name] Primary Care Joyanne Eddinger: Billey Gosling Other Clinician: Referring Adilenne Ashworth: Treating Whitman Meinhardt/Extender:Robson, Baird Lyons,  Nadine Counts in Treatment: 4 Encounter Discharge Information Items Discharge Condition: Stable Ambulatory Status: Ambulatory Discharge Destination: Home Transportation: Private Auto Accompanied By: self Schedule Follow-up Appointment: Yes Clinical Summary of Care: Patient Declined Electronic Signature(s) Signed: 12/03/2019 6:12:41 PM By: Kela Millin Entered By: Kela Millin on 12/03/2019 15:26:48 -------------------------------------------------------------------------------- Lower Extremity Assessment Details Patient Name: Date of Service: [name] 12/03/2019 1:45 PM Medical Record [id number] Patient Account Number: [id number] Date of Birth/Sex: Treating RN: 1961-03-30 (59 y.o. [name] Primary Care Zahniya Zellars: Billey Gosling Other Clinician: Referring Burnette Valenti: Treating Teyanna Thielman/Extender:Robson, Baird Lyons, Nadine Counts in Treatment: 4 Edema Assessment Assessed: [Left: No] [Right: No] Edema: [Left: Ye] [Right: s] Calf Left: Right: Point of Measurement: 30 cm From Medial Instep 38.5 cm cm Ankle Left: Right: Point of Measurement: 11 cm From Medial Instep 25 cm cm Vascular Assessment Pulses: Dorsalis Pedis Palpable: [Left:Yes] Electronic Signature(s) Signed: 12/03/2019 6:12:41 PM By: Kela Millin Entered By: Kela Millin on 12/03/2019 14:14:25 -------------------------------------------------------------------------------- Multi Wound Chart Details Patient Name: Date of Service: [name], [name] 12/03/2019 1:45 PM Medical Record [id number] Patient Account Number: [id number] Date of Birth/Sex: Treating RN: [birth year] (59 y.o. 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] [2:Lower Extremity] Comorbid History: [1:Anemia, Sleep Apnea, Congestive Heart Failure, Congestive Heart Failure, Hypertension, Peripheral Hypertension, Peripheral Venous Disease, Cirrhosis , Venous Disease, Cirrhosis , Type II Diabetes, Osteoarthritis, Neuropathy  Osteoarthritis, Neuropathy] [2:Anemia, Sleep Apnea, Type II Diabetes,] [N/A:N/A] Date Acquired: [1:02/16/2019] [2:02/16/2019] [N/A:N/A] Weeks of Treatment: [1:4] [2:4] [N/A:N/A] Wound Status: [1:Open] [2:Open] [N/A:N/A] Measurements L x W x D 2.5x2.8x0.1 [2:2.3x1.9x1] [N/A:N/A] (cm) Area (cm) : [1:5.498] [2:3.432] [N/A:N/A] Volume (cm) : [1:0.55] [  2:3.432] [N/A:N/A] % Reduction in Area: [1:38.60%] [2:-56.10%] [N/A:N/A] % Reduction in Volume: 91.20% [2:-30.00%] [N/A:N/A] Classification: [1:Grade 2] [2:Grade 2] [N/A:N/A] Exudate Amount: [1:Medium] [2:Medium] [N/A:N/A] Exudate Type: [1:Serosanguineous] [2:Serosanguineous] [N/A:N/A] Exudate Color: [1:red, brown] [2:red, brown] [N/A:N/A] Wound Margin: [1:Flat and Intact] [2:Flat and Intact] [N/A:N/A] Granulation Amount: [1:Large (67-100%)] [2:Large (67-100%)] [N/A:N/A] Granulation Quality: [1:Pink, Pale] [2:Pink] [N/A:N/A] Necrotic Amount: [1:None Present (0%)] [2:Small (1-33%)] [N/A:N/A] Exposed Structures: [1:Fat Layer (Subcutaneous Fat Layer (Subcutaneous N/A Tissue) Exposed: Yes Fascia: No Tendon: No Muscle: No Joint: No Bone: No] [2:Tissue) Exposed: Yes Fascia: No Tendon: No Muscle: No Joint: No Bone: No] Epithelialization: [1:Small (1-33%) Total Contact Cast] [2:Small (1-33%) N/A] [N/A:N/A] Treatment Notes Wound #1 (Left, Plantar Foot) 1.  Cleanse With Wound Cleanser Soap and water 2. Periwound Care Barrier cream 3. Primary Dressing Applied Calcium Alginate Ag 4. Secondary Dressing ABD Pad Kerramax/Xtrasorb 5. Secured With Tape 7. Footwear/Offloading device applied Total Contact Cast Electronic Signature(s) Signed: 12/03/2019 6:14:31 PM By: Zandra Abts RN, BSN Signed: 12/03/2019 6:16:05 PM By: Baltazar Najjar MD Entered By: Baltazar Najjar on 12/03/2019 15:31:34 -------------------------------------------------------------------------------- Multi-Disciplinary Care Plan Details Patient Name: Date of Service: [name], [name] 12/03/2019 1:45 PM Medical Record [id number] Patient Account Number: [id number] Date of Birth/Sex: Treating RN: 18-Nov-1960 (59 y.o. [name] Primary Care Seanne Chirico: [name] Other Clinician: Referring Vollie Brunty: Treating Dillon Livermore/Extender:Robson, Zebedee Iba, Duanne Limerick in Treatment: 4 Active Inactive Nutrition Nursing Diagnoses: Potential for alteratiion in Nutrition/Potential for imbalanced nutrition Goals: Patient/caregiver will maintain therapeutic glucose control Date Initiated: 10/31/2019 Target Resolution Date: 12/26/2019 Goal Status: Active Interventions: Assess HgA1c results as ordered upon admission and as needed Provide education on elevated blood sugars and impact on wound healing Treatment Activities: Patient referred to Primary Care Physician for further nutritional evaluation : 10/31/2019 Notes: Wound/Skin Impairment Nursing Diagnoses: Impaired tissue integrity Knowledge deficit related to ulceration/compromised skin integrity Goals: Patient/caregiver will verbalize understanding of skin care regimen Date Initiated: 10/31/2019 Target Resolution Date: 12/26/2019 Goal Status: Active Ulcer/skin breakdown will have a volume reduction of 30% by week 4 Date Initiated: 10/31/2019 Date Inactivated: 11/28/2019 Target Resolution Date: 11/28/2019 Unmet Reason:  left Goal Status: Unmet improving, right unable to offload Ulcer/skin breakdown will have a volume reduction of 50% by week 8 Date Initiated: 11/28/2019 Target Resolution Date: 12/26/2019 Goal Status: Active Interventions: Assess patient/caregiver ability to obtain necessary supplies Assess patient/caregiver ability to perform ulcer/skin care regimen upon admission and as needed Assess ulceration(s) every visit Provide education on ulcer and skin care Treatment Activities: Skin care regimen initiated : 10/31/2019 Topical wound management initiated : 10/31/2019 Notes: Electronic Signature(s) Signed: 12/03/2019 6:14:31 PM By: Zandra Abts RN, BSN Entered By: Zandra Abts on 12/03/2019 15:16:29 -------------------------------------------------------------------------------- Pain Assessment Details Patient Name: Date of Service: [name], [name] 12/03/2019 1:45 PM Medical Record [id number] Patient Account Number: [id number] Date of Birth/Sex: Treating RN: 10/27/1960 (59 y.o. Katherina Right Primary Care Samyak Sackmann: [name] Other Clinician: Referring Yanely Mast: Treating Luz Mares/Extender:Robson, Zebedee Iba, Duanne Limerick in Treatment: 4 Active Problems Location of Pain Severity and Description of Pain Patient Has Paino No Site Locations Pain Management and Medication Current Pain Management: Electronic Signature(s) Signed: 12/03/2019 6:12:41 PM By: Cherylin Mylar Entered By: Cherylin Mylar on 12/03/2019 14:14:18 -------------------------------------------------------------------------------- Patient/Caregiver Education Details Patient Name: Date of Service: MIKLO, AKEN 2/15/2021andnbsp1:45 PM Medical Record [phone] Patient Account Number: [id number] Date of Birth/Gender: 1960/12/22 (58 y.o. M) Treating RN: Zandra Abts Primary Care Physician: [name] Other Clinician: Referring Physician: Treating Physician/Extender:Robson, Zebedee Iba,  Duanne Limerick in Treatment: 4 Education Assessment Education Provided  To: Patient Education Topics Provided Wound/Skin Impairment: Methods: Explain/Verbal Responses: State content correctly Electronic Signature(s) Signed: 12/03/2019 6:14:31 PM By: Zandra Abts RN, BSN Entered By: Zandra Abts on 12/03/2019 15:16:45 -------------------------------------------------------------------------------- Wound Assessment Details Patient Name: Date of Service: [name], [name] 12/03/2019 1:45 PM Medical Record [id number] Patient Account Number: [id number] Date of Birth/Sex: Treating RN: 03-24-1961 (59 y.o. Katherina Right Primary Care Glorious Flicker: [name] Other Clinician: Referring Gurjit Loconte: Treating Gottlieb Zuercher/Extender:Robson, Zebedee Iba, Duanne Limerick in Treatment: 4 Wound Status Wound Number: 1 Primary Diabetic Wound/Ulcer of the Lower Extremity Etiology: Wound Location: Left Foot - Plantar Wound Open Wounding Event: Gradually Appeared Status: Date Acquired: 02/16/2019 Comorbid Anemia, Sleep Apnea, Congestive Heart Weeks Of Treatment: 4 History: Failure, Hypertension, Peripheral Venous Clustered Wound: No Disease, Cirrhosis , Type II Diabetes, Osteoarthritis, Neuropathy Wound Measurements Length: (cm) 2.5 % Reduction in Are Width: (cm) 2.8 % Reduction in Vol Depth: (cm) 0.1 Epithelialization: Area: (cm) 5.498 Tunneling: Volume: (cm) 0.55 Undermining: Wound Description Classification: Grade 2 Foul Odor After Cl Wound Margin: Flat and Intact Slough/Fibrino Exudate Amount: Medium Exudate Type: Serosanguineous Exudate Color: red, brown Wound Bed Granulation Amount: Large (67-100%) E Granulation Quality: Pink, Pale Fascia Exposed: Necrotic Amount: None Present (0%) Fat Layer (Subcuta Tendon Exposed: Muscle Exposed: Joint Exposed: Bone Exposed: eansing: No Yes xposed Structure No neous Tissue) Exposed: Yes No No No No a: 38.6% ume: 91.2% Small  (1-33%) No No Treatment Notes Wound #1 (Left, Plantar Foot) 1. Cleanse With Wound Cleanser Soap and water 2. Periwound Care Barrier cream 3. Primary Dressing Applied Calcium Alginate Ag 4. Secondary Dressing ABD Pad Kerramax/Xtrasorb 5. Secured With Tape 7. Footwear/Offloading device applied Total Contact Cast Electronic Signature(s) Signed: 12/03/2019 6:12:41 PM By: Cherylin Mylar Entered By: Cherylin Mylar on 12/03/2019 14:15:44 -------------------------------------------------------------------------------- Wound Assessment Details Patient Name: Date of Service: [name], [name] 12/03/2019 1:45 PM Medical Record [id number] Patient Account Number: [id number] Date of Birth/Sex: Treating RN: Jul 30, 1961 (59 y.o. Katherina Right Primary Care Reshonda Koerber: [name] Other Clinician: Referring Braley Luckenbaugh: Treating Breea Loncar/Extender:Robson, Zebedee Iba, Duanne Limerick in Treatment: 4 Wound Status Wound Number: 2 Primary Diabetic Wound/Ulcer of the Lower Extremity Etiology: Wound Location: Right Metatarsal head second Wound Open Wounding Event: Gradually Appeared Status: Date Acquired: 02/16/2019 Comorbid Anemia, Sleep Apnea, Congestive Heart Weeks Of Treatment: 4 History: Failure, Hypertension, Peripheral Venous Clustered Wound: No Disease, Cirrhosis , Type II Diabetes, Osteoarthritis, Neuropathy Wound Measurements Length: (cm) 2.3 % Reduction Width: (cm) 1.9 % Reduction Depth: (cm) 1 Epithelializ Area: (cm) 3.432 Tunneling: Volume: (cm) 3.432 Undermining Wound Description Classification: Grade 2 Wound Margin: Flat and Intact Exudate Amount: Medium Exudate Type: Serosanguineous Exudate Color: red, brown Wound Bed Granulation Amount: Large (67-100%) Granulation Quality: Pink Necrotic Amount: Small (1-33%) Necrotic Quality: Adherent Slough Foul Odor After Cleansing: No Slough/Fibrino Yes Exposed Structure Fascia Exposed: No Fat Layer  (Subcutaneous Tissue) Exposed: Yes Tendon Exposed: No Muscle Exposed: No Joint Exposed: No Bone Exposed: No in Area: -56.1% in Volume: -30% ation: Small (1-33%) No : No Electronic Signature(s) Signed: 12/03/2019 6:12:41 PM By: Cherylin Mylar Entered By: Cherylin Mylar on 12/03/2019 14:15:52 -------------------------------------------------------------------------------- Vitals Details Patient Name: Date of Service: [name], [name] 12/03/2019 1:45 PM Medical Record [id number] Patient Account Number: [id number] Date of Birth/Sex: Treating RN: 20-Jan-1961 (59 y.o. Katherina Right Primary Care Kennadee Walthour: [name] Other Clinician: Referring Roselene Gray: Treating Jearl Soto/Extender:Robson, Zebedee Iba, Duanne Limerick in Treatment: 4 Vital Signs Time Taken: 14:00 Temperature (F): 98.1 Height (in): 73 Pulse (bpm): 82 Weight (lbs): 260 Respiratory Rate (breaths/min): 18 Body Mass Index (BMI):  34.3 Blood Pressure (mmHg): 166/83 Reference Range: 80 - 120 mg / dl Electronic Signature(s) Signed: 12/03/2019 6:12:41 PM By: Cherylin Mylar Entered By: Cherylin Mylar on 12/03/2019 14:14:02

## 2019-12-03 NOTE — Progress Notes (Signed)
[name], [name] ([id number]) Visit Report for 11/30/2019 HPI Details Patient Name: Date of Service: [name], [name] 11/30/2019 4:00 PM Medical Record [id number] Patient Account Number: [id number] Date of Birth/Sex: Treating RN: [birth year] (59 y.o. M) Primary Care Provider: [name] Other Clinician: Referring Provider: Treating Provider/Extender:Anguel Delapena, Zebedee Iba, Duanne Limerick in Treatment: 4 History of Present Illness HPI Description: 10/31/2019 upon evaluation today patient appears to be doing somewhat poorly in regard to his bilateral plantar feet. He has wounds that he tells me have been present since 2012 intermittently off and on. Most recently this has been open for at least the past 6 months to a year. He has been trying to treat this in different ways using Santyl along with various other dressings including Medihoney and even at one point Xeroform. Nothing really has seem to get this completely closed. He was recently in the hospital for cellulitis of his leg subsequently he did have x-rays as well as MRIs that showed negative for any signs of osteomyelitis in regard to the wounds on his feet. Fortunately there is no signs of systemic infection at this time. No fevers, chills, nausea, vomiting, or diarrhea. Patient has previously used Darco offloading shoes as far as frontal floaters as well as postop surgical shoes. He has never been in a total contact cast that may be something we need to strongly consider here. Patient's most recent hemoglobin A1c 1 month ago was 5.3 seems to be very well controlled which is great. Subsequently he has seen vascular as well as podiatry. His ABIs are 1.07 on the left and 1.14 on the right he seems to be doing well he does have chronic venous stasis. 11/07/2019 upon evaluation today patient appears to be doing well with regard to his wounds all things considered. I do not see any severe worsening he still has some callus buildup on the right  more than the left he notes that he has been probably more active than he should as far as walking is concerned is just very hard to not be active. He knows he needs to be more careful in this regard however. He is willing to give the cast a try at this point although he notes that he is a little nervous about this just with regard to balance although he will be very careful and obviously if he has any trouble he knows to contact the office and let me know. 1/22; patient is in for his obligatory first total contact cast change. Our intake nurse reported a very large amount of drainage which is spelled out over to the surrounding skin. Has bilateral diabetic foot wounds. He has Charcot feet. We have been using silver alginate on his wounds. 11/14/2019 on evaluation today patient is actually seeming to make good improvement in regard to his bilateral plantar foot wounds. We have been using a cast on the left side and on the right side he has been using dressings he is changing up his own accord. With that being said he tells me that he is also not walking as much just due to how unsteady he feels. He takes it easy when he does have to walk and when he does not have to walk he is resting. This is probably help in his right foot as well has the left foot which is actually measuring better. In fact both are measuring better. Overall I am very pleased with how things seem to be progressing. The patient does have some odor on the left foot  this does have me concerned about the possibility of infection, and actually probably go ahead and put him on antibiotics today as well as utilizing a continuation of the cast on the left foot I think that will be fine we probably just need to bring him in sooner to change this not last a whole week. 1/29; we brought the patient back today for a total contact cast change on the left out of concern for excessive drainage. We are using drawtex over the wound as the primary  dressing 11/21/2019 on evaluation today patient appears to be doing well with regard to his left plantar foot. In fact both foot ulcers actually seem to be doing pretty well. Nonetheless he is having a lot of drainage on the left at this time and again we did obtain a wound culture did show positive for Staph aureus that was reviewed by myself today as well. Nonetheless he is on Bactrim which was shown to be sensitive that should be helping in this regard. Fortunately there is no signs of infection systemically at this point. 2/5; back in clinic today for a total contact cast change apparently secondary to very significant drainage. Still using drawtex 11/28/2019 upon evaluation today patient appears to be doing well with regard to his wounds. The right foot is doing okay as measured about the same in my opinion. The left foot is actually showing signs of significant improvement is measuring smaller there is a lot of hyper granulation likely due to the continued drainage at this point. We did obtain approval for a snap VAC I think that is good to be appropriate for him and will likely help this tremendously underneath the cast. He is definitely in agreement with proceeding with such. 2/12; patient came in today after his snap VAC lost suction. Brought in to see one of our nurses. The dressing was replaced and then we put the cast back on and rehooked up the snap VAC. Apparently his wound looked very good per our intake nurse. Electronic Signature(s) Signed: 11/30/2019 5:45:04 PM By: Baltazar Najjar MD Entered By: Baltazar Najjar on 11/30/2019 17:05:11 -------------------------------------------------------------------------------- Physical Exam Details Patient Name: Date of Service: [name], [name] 11/30/2019 4:00 PM Medical Record [id number] Patient Account Number: [id number] Date of Birth/Sex: Treating RN: 1960-10-20 (59 y.o. M) Primary Care Provider: [name] Other  Clinician: Referring Provider: Treating Provider/Extender:Orli Degrave, Zebedee Iba, Duanne Limerick in Treatment: 4 Notes Wound exam; I did not review the wound today however per our nurse intake things look very good. Total contact cast was replaced after the dressing and snap VAC was redone Electronic Signature(s) Signed: 11/30/2019 5:45:04 PM By: Baltazar Najjar MD Entered By: Baltazar Najjar on 11/30/2019 17:05:39 -------------------------------------------------------------------------------- Physician Orders Details Patient Name: Date of Service: ROYALTY, REINHARDT 11/30/2019 4:00 PM Medical Record [id number] Patient Account Number: [id number] Date of Birth/Sex: Treating RN: 1961-06-08 (59 y.o. Katherina Right Primary Care Provider: [name] Other Clinician: Referring Provider: Treating Provider/Extender:Dereona Kolodny, Zebedee Iba, Duanne Limerick in Treatment: 4 Verbal / Phone Orders: No Diagnosis Coding ICD-10 Coding Code Description E11.622 Type 2 diabetes mellitus with other skin ulcer L97.522 Non-pressure chronic ulcer of other part of left foot with fat layer exposed L97.512 Non-pressure chronic ulcer of other part of right foot with fat layer exposed I10 Essential (primary) hypertension I50.42 Chronic combined systolic (congestive) and diastolic (congestive) heart failure I87.2 Venous insufficiency (chronic) (peripheral) L84 Corns and callosities Follow-up Appointments Return Appointment in 1 week. - Wed Dressing Change Frequency Wound #1 Left,Plantar Foot Do not  change entire dressing for one week. Wound #2 Right Metatarsal head second Change Dressing every other day. Skin Barriers/Peri-Wound Care Wound #1 Left,Plantar Foot Skin Prep Wound Cleansing Wound #1 Left,Plantar Foot May shower with protection. - cast protector Wound #2 Right Metatarsal head second May shower and wash wound with soap and water. Primary Wound Dressing Wound #2 Right Metatarsal head  second Calcium Alginate with Silver - pack into wound bed Secondary Dressing Wound #2 Right Metatarsal head second Kerlix/Rolled Gauze Dry Gauze Other: - felt callous pad Negative Presssure Wound Therapy Wound #1 Left,Plantar Foot SNAP Vac to wound continuously at 119mm/hg pressure Edema Control Patient to wear own compression stockings - daily to right leg Avoid standing for long periods of time Elevate legs to the level of the heart or above for 30 minutes daily and/or when sitting, a frequency of: Off-Loading Total Contact Cast to Left Lower Extremity - foam padding between left great and 2nd toe Open toe surgical shoe to: - right foot daily Electronic Signature(s) Signed: 11/30/2019 5:45:04 PM By: Linton Ham MD Signed: 12/03/2019 6:12:41 PM By: Kela Millin Entered By: Kela Millin on 11/30/2019 17:01:36 -------------------------------------------------------------------------------- Problem List Details Patient Name: Date of Service: RAYMONT, ANDREONI 11/30/2019 4:00 PM Medical Record [id number] Patient Account Number: [id number] Date of Birth/Sex: Treating RN: [birth year] (59 y.o. [name] Primary Care Provider: Billey Gosling Other Clinician: Referring Provider: Treating Provider/Extender:Gerlean Cid, Baird Lyons, Nadine Counts in Treatment: 4 Active Problems ICD-10 Evaluated Encounter Code Description Active Date Today Diagnosis E11.622 Type 2 diabetes mellitus with other skin ulcer 10/31/2019 No Yes L97.522 Non-pressure chronic ulcer of other part of left foot 10/31/2019 No Yes with fat layer exposed L97.512 Non-pressure chronic ulcer of other part of right foot 10/31/2019 No Yes with fat layer exposed I10 Essential (primary) hypertension 10/31/2019 No Yes I50.42 Chronic combined systolic (congestive) and diastolic 07/01/7828 No Yes (congestive) heart failure I87.2 Venous insufficiency (chronic) (peripheral) 10/31/2019 No Yes L84 Corns and  callosities 11/14/2019 No Yes Inactive Problems Resolved Problems Electronic Signature(s) Signed: 11/30/2019 5:45:04 PM By: Linton Ham MD Entered By: Linton Ham on 11/30/2019 17:04:02 -------------------------------------------------------------------------------- Progress Note Details Patient Name: Date of Service: TYQUARIUS, PAGLIA 11/30/2019 4:00 PM Medical Record [id number] Patient Account Number: [id number] Date of Birth/Sex: Treating RN: 31-Jul-1961 (59 y.o. M) Primary Care Provider: Billey Gosling Other Clinician: Referring Provider: Treating Provider/Extender:Loan Oguin, Baird Lyons, Nadine Counts in Treatment: 4 Subjective History of Present Illness (HPI) 10/31/2019 upon evaluation today patient appears to be doing somewhat poorly in regard to his bilateral plantar feet. He has wounds that he tells me have been present since 2012 intermittently off and on. Most recently this has been open for at least the past 6 months to a year. He has been trying to treat this in different ways using Santyl along with various other dressings including Medihoney and even at one point Xeroform. Nothing really has seem to get this completely closed. He was recently in the hospital for cellulitis of his leg subsequently he did have x-rays as well as MRIs that showed negative for any signs of osteomyelitis in regard to the wounds on his feet. Fortunately there is no signs of systemic infection at this time. No fevers, chills, nausea, vomiting, or diarrhea. Patient has previously used Darco offloading shoes as far as frontal floaters as well as postop surgical shoes. He has never been in a total contact cast that may be something we need to strongly consider here. Patient's most recent hemoglobin A1c 1 month ago was 5.3 seems  to be very well controlled which is great. Subsequently he has seen vascular as well as podiatry. His ABIs are 1.07 on the left and 1.14 on the right he seems to be doing  well he does have chronic venous stasis. 11/07/2019 upon evaluation today patient appears to be doing well with regard to his wounds all things considered. I do not see any severe worsening he still has some callus buildup on the right more than the left he notes that he has been probably more active than he should as far as walking is concerned is just very hard to not be active. He knows he needs to be more careful in this regard however. He is willing to give the cast a try at this point although he notes that he is a little nervous about this just with regard to balance although he will be very careful and obviously if he has any trouble he knows to contact the office and let me know. 1/22; patient is in for his obligatory first total contact cast change. Our intake nurse reported a very large amount of drainage which is spelled out over to the surrounding skin. Has bilateral diabetic foot wounds. He has Charcot feet. We have been using silver alginate on his wounds. 11/14/2019 on evaluation today patient is actually seeming to make good improvement in regard to his bilateral plantar foot wounds. We have been using a cast on the left side and on the right side he has been using dressings he is changing up his own accord. With that being said he tells me that he is also not walking as much just due to how unsteady he feels. He takes it easy when he does have to walk and when he does not have to walk he is resting. This is probably help in his right foot as well has the left foot which is actually measuring better. In fact both are measuring better. Overall I am very pleased with how things seem to be progressing. The patient does have some odor on the left foot this does have me concerned about the possibility of infection, and actually probably go ahead and put him on antibiotics today as well as utilizing a continuation of the cast on the left foot I think that will be fine we probably just  need to bring him in sooner to change this not last a whole week. 1/29; we brought the patient back today for a total contact cast change on the left out of concern for excessive drainage. We are using drawtex over the wound as the primary dressing 11/21/2019 on evaluation today patient appears to be doing well with regard to his left plantar foot. In fact both foot ulcers actually seem to be doing pretty well. Nonetheless he is having a lot of drainage on the left at this time and again we did obtain a wound culture did show positive for Staph aureus that was reviewed by myself today as well. Nonetheless he is on Bactrim which was shown to be sensitive that should be helping in this regard. Fortunately there is no signs of infection systemically at this point. 2/5; back in clinic today for a total contact cast change apparently secondary to very significant drainage. Still using drawtex 11/28/2019 upon evaluation today patient appears to be doing well with regard to his wounds. The right foot is doing okay as measured about the same in my opinion. The left foot is actually showing signs of significant improvement is  measuring smaller there is a lot of hyper granulation likely due to the continued drainage at this point. We did obtain approval for a snap VAC I think that is good to be appropriate for him and will likely help this tremendously underneath the cast. He is definitely in agreement with proceeding with such. 2/12; patient came in today after his snap VAC lost suction. Brought in to see one of our nurses. The dressing was replaced and then we put the cast back on and rehooked up the snap VAC. Apparently his wound looked very good per our intake nurse. Objective Constitutional Vitals Time Taken: 4:53 PM, Height: 73 in, Source: Stated, Weight: 260 lbs, Source: Stated, BMI: 34.3, Temperature: 98.2 F, Pulse: 92 bpm, Respiratory Rate: 18 breaths/min, Blood Pressure: 129/73  mmHg. Integumentary (Hair, Skin) Wound #1 status is Open. Original cause of wound was Gradually Appeared. The wound is located on the Left,Plantar Foot. The wound measures 2.5cm length x 2.8cm width x 0.1cm depth; 5.498cm^2 area and 0.55cm^3 volume. There is Fat Layer (Subcutaneous Tissue) Exposed exposed. There is no tunneling or undermining noted. There is a medium amount of serosanguineous drainage noted. The wound margin is flat and intact. There is large (67-100%) pink, pale granulation within the wound bed. There is no necrotic tissue within the wound bed. 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 There was a Total Contact Cast Procedure by Maxwell Caul., MD. Post procedure Diagnosis Wound #1: Same as Pre-Procedure Plan Follow-up Appointments: Return Appointment in 1 week. - Wed Dressing Change Frequency: Wound #1 Left,Plantar Foot: Do not change entire dressing for one week. Wound #2 Right Metatarsal head second: Change Dressing every other day. Skin Barriers/Peri-Wound Care: Wound #1 Left,Plantar Foot: Skin Prep Wound Cleansing: Wound #1 Left,Plantar Foot: May shower with protection. - cast protector Wound #2 Right Metatarsal head second: May shower and wash wound with soap and water. Primary Wound Dressing: Wound #2 Right Metatarsal head second: Calcium Alginate with Silver - pack into wound bed Secondary Dressing: Wound #2 Right Metatarsal head  second: Kerlix/Rolled Gauze Dry Gauze Other: - felt callous pad Negative Presssure Wound Therapy: Wound #1 Left,Plantar Foot: SNAP Vac to wound continuously at 146mm/hg pressure Edema Control: Patient to wear own compression stockings - daily to right leg Avoid standing for long periods of time Elevate legs to the level of the heart or above for 30 minutes daily and/or when sitting, a frequency of: Off-Loading: Total Contact Cast to Left Lower Extremity - foam padding between left great and 2nd toe Open toe surgical shoe to: - right foot daily 1. Total contact cast replaced in standard fashion 2. Wound dressing and snap VAC also replaced secondary to loss of suction and the [facility] Electronic Signature(s) Signed: 11/30/2019 5:45:04 PM By: Baltazar Najjar MD Entered By: Baltazar Najjar on 11/30/2019 17:06:24 -------------------------------------------------------------------------------- Total Contact Cast Details Patient Name: Date of Service: CRIST, KRUSZKA 11/30/2019 4:00 PM Medical Record [id number] Patient Account Number: [id number] Date of Birth/Sex: Treating RN: [birth year] (59 y.o. M) Primary Care Provider: [name] Other Clinician: Referring Provider: Treating Provider/Extender:Roza Creamer, Zebedee Iba,  Stacy Weeks in Treatment: 4 Total Contact Cast Applied for Wound Assessment: Wound #1 Left,Plantar Foot Performed By: Physician Maxwell Caul., MD Post Procedure Diagnosis Same as Pre-procedure Electronic Signature(s) Signed: 11/30/2019 5:45:04 PM By: Baltazar Najjar MD Entered By: Baltazar Najjar on 11/30/2019 17:04:20 -------------------------------------------------------------------------------- SuperBill Details Patient Name: Date of Service: KARSTEN, HOWRY 11/30/2019 Medical Record [id number] Patient Account Number: [id number] Date of Birth/Sex: Treating RN: [birth year] (59 y.o. Katherina Right Primary Care Provider: [name] Other  Clinician: Referring Provider: Treating Provider/Extender:Afrika Brick, Zebedee Iba, Duanne Limerick in Treatment: 4 Diagnosis Coding ICD-10 Codes Code Description E11.622 Type 2 diabetes mellitus with other skin ulcer L97.522 Non-pressure chronic ulcer of other part of left foot with fat layer exposed L97.512 Non-pressure chronic ulcer of other part of right foot with fat layer exposed I10 Essential (primary) hypertension I50.42 Chronic combined systolic (congestive) and diastolic (congestive) heart failure I87.2 Venous insufficiency (chronic) (peripheral) L84 Corns and callosities Facility Procedures CPT4 Code Description: [id number] - APPLY TOTAL CONTACT LEG CAST ICD-10 Diagnosis Description L97.522 Non-pressure chronic ulcer of other part of left foot with Modifier: fat layer exp Quantity: 1 osed CPT4 Code Description: [id number] NEG PRESS WND TX <=50 SQ CM Modifier: 59 Quantity: 1 Physician Procedures Electronic Signature(s) Signed: 11/30/2019 5:30:09 PM By: Zenaida Deed RN, BSN Signed: 11/30/2019 5:45:04 PM By: Baltazar Najjar MD Entered By: Zenaida Deed on 11/30/2019 17:08:02

## 2019-12-03 NOTE — Progress Notes (Signed)
[name], [name] ([id number]) Visit Report for 11/30/2019 Arrival Information Details Patient Name: Date of Service: [name], [name] 11/30/2019 4:00 PM Medical Record [id number] Patient Account Number: [id number] Date of Birth/Sex: Treating RN: 10-01-1961 (59 y.o. [name] Primary Care [name]: [name] Other Clinician: Referring [name]: Treating [name]:[name]: 4 Visit Information History Since Last Visit Added or deleted any medications: No Patient Arrived: Ambulatory Any new allergies or adverse reactions: No Arrival Time: 16:51 Had a fall or experienced change in No Accompanied By: self activities of daily living that may affect Transfer Assistance: None risk of falls: Patient Identification Verified: Yes Signs or symptoms of abuse/neglect since No Secondary Verification Process Yes last visito Completed: Hospitalized since last visit: No Patient Requires Transmission-Based No Implantable device outside of the clinic No Precautions: excluding Patient Has Alerts: Yes cellular tissue based products placed in Patient Alerts: L ABI 1.07 the center (04/2019) since last visit: R ABI 1.14 Has Dressing in Place as Prescribed: Yes (04/2019) Has Footwear/Offloading in Place as Yes Prescribed: Left: Total Contact Cast Pain Present Now: No Electronic Signature(s) Signed: 11/30/2019 5:30:09 PM By: [name] Gouty RN, BSN Entered By: [name] on 11/30/2019 16:52:42 -------------------------------------------------------------------------------- Encounter Discharge Information Details Patient Name: Date of Service: [name], [name] 11/30/2019 4:00 PM Medical Record [id number] Patient Account Number: [id number] Date of Birth/Sex: Treating RN: [birth year] (59 y.o. [name] Primary Care [name]: [name] Other Clinician: Referring Sharisa Toves: Treating [name]:Robson,  Baird Lyons, [name]: 4 Encounter Discharge Information Items Discharge Condition: Stable Ambulatory Status: Ambulatory Discharge Destination: Home Transportation: Private Auto Accompanied By: self Schedule Follow-up Appointment: Yes Clinical Summary of Care: Patient Declined Electronic Signature(s) Signed: 11/30/2019 5:30:09 PM By: [name] Gouty RN, BSN Entered By: [name] on 11/30/2019 17:15:41 -------------------------------------------------------------------------------- Lower Extremity Assessment Details Patient Name: Date of Service: [name] 11/30/2019 4:00 PM Medical Record [id number] Patient Account Number: [id number] Date of Birth/Sex: Treating RN: 1960/11/04 (59 y.o. [name] Primary Care [name]: [name] Other Clinician: Referring Roston Grunewald: Treating Shemiah Rosch/Extender:[name]: 4 Edema Assessment Assessed: [Left: No] [Right: No] Edema: [Left: Ye] [Right: s] Calf Left: Right: Point of Measurement: 30 cm From Medial Instep 38.5 cm cm Ankle Left: Right: Point of Measurement: 11 cm From Medial Instep 25 cm cm Vascular Assessment Pulses: Dorsalis Pedis Palpable: [Left:Yes] Electronic Signature(s) Signed: 11/30/2019 5:30:09 PM By: [name] Gouty RN, BSN Entered By: [name] on 11/30/2019 16:53:58 -------------------------------------------------------------------------------- Multi Wound Chart Details Patient Name: Date of Service: [name], [name] 11/30/2019 4:00 PM Medical Record [id number] Patient Account Number: [id number] Date of Birth/Sex: Treating RN: August 25, 1961 (59 y.o. M) Primary Care [name]: [name] Other Clinician: Referring [name]: Treating [name]:[name]: 4 Vital Signs Height(in): 73 Pulse(bpm): 50 Weight(lbs): 260 Blood Pressure(mmHg): 129/73 Body Mass Index(BMI):  34 Temperature(F): 98.2 Respiratory 18 Rate(breaths/min): Photos: [1:No Photos] [N/A:N/A] Wound Location: [1:Left Foot - Plantar] [N/A:N/A] Wounding Event: [1:Gradually Appeared] [N/A:N/A] Primary Etiology: [1:Diabetic Wound/Ulcer of the N/A Lower Extremity] Comorbid History: [1:Anemia, Sleep Apnea, Congestive Heart Failure, Hypertension, Peripheral Venous Disease, Cirrhosis , Type II Diabetes, Osteoarthritis, Neuropathy] [N/A:N/A] Date Acquired: [1:02/16/2019] [N/A:N/A] Weeks of [name]: [1:4] [N/A:N/A] Wound Status: [1:Open] [N/A:N/A] Measurements L x W x D 2.5x2.8x0.1 [N/A:N/A] (cm) Area (cm) : [1:5.498] [N/A:N/A] Volume (cm) : [1:0.55] [N/A:N/A] % Reduction in Area: [1:38.60%] [N/A:N/A] % Reduction in Volume: 91.20% [N/A:N/A] Classification: [1:Grade 2] [N/A:N/A] Exudate Amount: [1:Medium] [N/A:N/A] Exudate Type: [1:Serosanguineous] [N/A:N/A] Exudate Color: [1:red, brown] [N/A:N/A] Wound Margin: [1:Flat  and Intact] [N/A:N/A] Granulation Amount: [1:Large (67-100%)] [N/A:N/A] Granulation Quality: [1:Pink, Pale] [N/A:N/A] Necrotic Amount: [1:None Present (0%)] [N/A:N/A] Exposed Structures: [1:Fat Layer (Subcutaneous N/A Tissue) Exposed: Yes Fascia: No Tendon: No Muscle: No Joint: No Bone: No] Epithelialization: [1:Small (1-33%) Total Contact Cast] [N/A:N/A] [name] Notes Electronic Signature(s) Signed: 11/30/2019 5:45:04 PM By: [name] Najjar MD Entered By: [name] on 11/30/2019 17:04:09 -------------------------------------------------------------------------------- Multi-Disciplinary Care Plan Details Patient Name: Date of Service: [name], [name] 11/30/2019 4:00 PM Medical Record [id number] Patient Account Number: [id number] Date of Birth/Sex: Treating RN: [birth year] (59 y.o. Katherina Right Primary Care [name]: [name] Other Clinician: Referring [name]: Treating [name]:Robson, [name], [name] in [name]:  4 Active Inactive Nutrition Nursing Diagnoses: Potential for alteratiion in Nutrition/Potential for imbalanced nutrition Goals: Patient/caregiver will maintain therapeutic glucose control Date Initiated: 10/31/2019 Target Resolution Date: 12/26/2019 Goal Status: Active Interventions: Assess HgA1c results as ordered upon admission and as needed Provide education on elevated blood sugars and impact on wound healing [name] Activities: Patient referred to Primary Care Physician for further nutritional evaluation : 10/31/2019 Notes: Wound/Skin Impairment Nursing Diagnoses: Impaired tissue integrity Knowledge deficit related to ulceration/compromised skin integrity Goals: Patient/caregiver will verbalize understanding of skin care regimen Date Initiated: 10/31/2019 Target Resolution Date: 12/26/2019 Goal Status: Active Ulcer/skin breakdown will have a volume reduction of 30% by week 4 Date Initiated: 10/31/2019 Date Inactivated: 11/28/2019 Target Resolution Date: 11/28/2019 Unmet Reason: left Goal Status: Unmet improving, right unable to offload Ulcer/skin breakdown will have a volume reduction of 50% by week 8 Date Initiated: 11/28/2019 Target Resolution Date: 12/26/2019 Goal Status: Active Interventions: Assess patient/caregiver ability to obtain necessary supplies Assess patient/caregiver ability to perform ulcer/skin care regimen upon admission and as needed Assess ulceration(s) every visit Provide education on ulcer and skin care [name] Activities: Skin care regimen initiated : 10/31/2019 Topical wound management initiated : 10/31/2019 Notes: Electronic Signature(s) Signed: 12/03/2019 6:12:41 PM By: Cherylin Mylar Entered By: Cherylin Mylar on 11/30/2019 17:02:38 -------------------------------------------------------------------------------- Negative Pressure Wound Therapy Maintenance (NPWT) Details Patient Name: Date of Service: [name], [name] 11/30/2019 4:00  PM Medical Record [id number] Patient Account Number: [id number] Date of Birth/Sex: Treating RN: [birth year] (59 y.o. [name] Primary Care Johnmatthew Solorio: [name] Other Clinician: Referring Shyheim Tanney: Treating Greggory Safranek/Extender:Robson, [name], [name] in [name]: 4 NPWT Maintenance Performed for: Wound #1 Left, Plantar Foot Performed By: Zenaida Deed, RN Type: Other Coverage Size (sq cm): 7 Pressure Type: Constant Pressure Setting: 125 mmHG Drain Type: None Sponge/Dressing Type: Foam, Green Date Initiated: 11/28/2019 Dressing Removed: No Quantity of Sponges/Gauze Removed: 1 Canister Changed: No Dressing Reapplied: No Quantity of Sponges/Gauze Inserted: 1 Respones To [name]: good Days On NPWT: 3 Post Procedure Diagnosis Same as Pre-procedure Notes SNAP Electronic Signature(s) Signed: 11/30/2019 5:30:09 PM By: Zenaida Deed RN, BSN Entered By: Zenaida Deed on 11/30/2019 17:05:18 -------------------------------------------------------------------------------- Pain Assessment Details Patient Name: Date of Service: [name], [name] 11/30/2019 4:00 PM Medical Record [id number] Patient Account Number: [id number] Date of Birth/Sex: Treating RN: [birth year] (59 y.o. [name] Primary Care Jaleen Grupp: [name] Other Clinician: Referring Miachel Nardelli: Treating Kamrynn Melott/Extender:Robson, [name], [name] in [name]: 4 Active Problems Location of Pain Severity and Description of Pain Patient Has Paino No Site Locations Rate the pain. Current Pain Level: 0 Pain Management and Medication Current Pain Management: Electronic Signature(s) Signed: 11/30/2019 5:30:09 PM By: Zenaida Deed RN, BSN Entered By: Zenaida Deed on 11/30/2019 16:53:46 -------------------------------------------------------------------------------- Patient/Caregiver Education Details Patient Name: Date of Service: [name], [name] 2/12/2021andnbsp4:00  PM Medical Record [id number] Patient Account Number: [id number] Date  of Birth/Gender: 11/21/1960 (59 y.o. M) Treating RN: Cherylin Mylar Primary Care Physician: [name] Other Clinician: Referring Physician: Treating Physician/Extender:Robson, [name], [name] in [name]: 4 Education Assessment Education Provided To: Patient Education Topics Provided Elevated Blood Sugar/ Impact on Healing: Methods: Explain/Verbal Responses: State content correctly Wound/Skin Impairment: Methods: Explain/Verbal Responses: State content correctly Electronic Signature(s) Signed: 12/03/2019 6:12:41 PM By: Cherylin Mylar Entered By: Cherylin Mylar on 11/30/2019 17:02:52 -------------------------------------------------------------------------------- Wound Assessment Details Patient Name: Date of Service: [name], [name] 11/30/2019 4:00 PM Medical Record [id number] Patient Account Number: [id number] Date of Birth/Sex: Treating RN: 15-Apr-1961 (59 y.o. [name] Primary Care Jerel Sardina: [name] Other Clinician: Referring Diva Lemberger: Treating Lerline Valdivia/Extender:Robson, [name], [name] in [name]: 4 Wound Status Wound Number: 1 Primary Diabetic Wound/Ulcer of the Lower Extremity Etiology: Wound Location: Left Foot - Plantar Wound Open Wounding Event: Gradually Appeared Status: Date Acquired: 02/16/2019 Comorbid Anemia, Sleep Apnea, Congestive Heart Weeks Of [name]: 4 History: Failure, Hypertension, Peripheral Venous Clustered Wound: No Disease, Cirrhosis , Type II Diabetes, Osteoarthritis, Neuropathy Wound Measurements Length: (cm) 2.5 Width: (cm) 2.8 Depth: (cm) 0.1 Area: (cm) 5.498 Volume: (cm) 0.55 Wound Description Classification: Grade 2 Wound Margin: Flat and Intact Exudate Amount: Medium Exudate Type: Serosanguineous Exudate Color: red, brown Wound Bed Granulation Amount: Large (67-100%) Granulation Quality: Pink,  Pale Necrotic Amount: None Present (0%) Foul Odor After Cleansing: No Slough/Fibrino Yes Exposed Structure Fascia Exposed: N Fat Layer (Subcutaneous Tissue) Exposed: Y Tendon Exposed: N Muscle Exposed: N Joint Exposed: N Bone Exposed: N % Reduction in Area: 38.6% % Reduction in Volume: 91.2% Epithelialization: Small (1-33%) Tunneling: No Undermining: No o es o o o o Psychologist, prison and probation services) Signed: 11/30/2019 5:30:09 PM By: Zenaida Deed RN, BSN Entered By: Zenaida Deed on 11/30/2019 16:54:34 -------------------------------------------------------------------------------- Vitals Details Patient Name: Date of Service: [name], [name] 11/30/2019 4:00 PM Medical Record [id number] Patient Account Number: [id number] Date of Birth/Sex: Treating RN: [birth year] (59 y.o. [name] Primary Care Mardell Cragg: [name] Other Clinician: Referring Everton Bertha: Treating Lucella Pommier/Extender:Robson, [name], [name] in [name]: 4 Vital Signs Time Taken: 16:53 Temperature (F): 98.2 Height (in): 73 Pulse (bpm): 92 Source: Stated Respiratory Rate (breaths/min): 18 Weight (lbs): 260 Blood Pressure (mmHg): 129/73 Source: Stated Reference Range: 80 - 120 mg / dl Body Mass Index (BMI): 34.3 Electronic Signature(s) Signed: 11/30/2019 5:30:09 PM By: Zenaida Deed RN, BSN Entered By: Zenaida Deed on 11/30/2019 16:53:37

## 2019-12-05 ENCOUNTER — Other Ambulatory Visit: Payer: Self-pay

## 2019-12-05 ENCOUNTER — Encounter (HOSPITAL_BASED_OUTPATIENT_CLINIC_OR_DEPARTMENT_OTHER): Payer: Medicare (Managed Care) | Admitting: Physician Assistant

## 2019-12-05 DIAGNOSIS — E11622 Type 2 diabetes mellitus with other skin ulcer: Secondary | ICD-10-CM | POA: Diagnosis not present

## 2019-12-05 NOTE — Progress Notes (Addendum)
[name], [name] ([id number]) Visit Report for 12/05/2019 Chief Complaint Document Details Patient Name: Date of Service: TATSUYA, OKRAY 12/05/2019 2:30 PM Medical Record [id number] Patient Account Number: [id number] Date of Birth/Sex: Treating RN: 1961-03-05 (59 y.o. [name] Primary Care Provider: [name] Other Clinician: Referring Provider: Treating Provider/Extender:Stone III, Dayton Martes, Duanne Limerick in Treatment: 5 Information Obtained from: Patient Chief Complaint Bilateral foot diabetic ulcers Electronic Signature(s) Signed: 12/05/2019 3:51:32 PM By: Lenda Kelp PA-C Entered By: Lenda Kelp on 12/05/2019 15:51:32 -------------------------------------------------------------------------------- HPI Details Patient Name: Date of Service: [name], [name] 12/05/2019 2:30 PM Medical Record [id number] Patient Account Number: [id number] Date of Birth/Sex: Treating RN: 15-Nov-1960 (59 y.o. [name] Primary Care Provider: [name] Other Clinician: Referring Provider: Treating Provider/Extender:Stone III, Dayton Martes, Stacy Weeks in Treatment: 5 History of Present Illness HPI Description: 10/31/2019 upon evaluation today patient appears to be doing somewhat poorly in regard to his bilateral plantar feet. He has wounds that he tells me have been present since 2012 intermittently off and on. Most recently this has been open for at least the past 6 months to a year. He has been trying to treat this in different ways using Santyl along with various other dressings including Medihoney and even at one point Xeroform. Nothing really has seem to get this completely closed. He was recently in the hospital for cellulitis of his leg subsequently he did have x-rays as well as MRIs that showed negative for any signs of osteomyelitis in regard to the wounds on his feet. Fortunately there is no signs of systemic infection at this time. No fevers, chills, nausea,  vomiting, or diarrhea. Patient has previously used Darco offloading shoes as far as frontal floaters as well as postop surgical shoes. He has never been in a total contact cast that may be something we need to strongly consider here. Patient's most recent hemoglobin A1c 1 month ago was 5.3 seems to be very well controlled which is great. Subsequently he has seen vascular as well as podiatry. His ABIs are 1.07 on the left and 1.14 on the right he seems to be doing well he does have chronic venous stasis. 11/07/2019 upon evaluation today patient appears to be doing well with regard to his wounds all things considered. I do not see any severe worsening he still has some callus buildup on the right more than the left he notes that he has been probably more active than he should as far as walking is concerned is just very hard to not be active. He knows he needs to be more careful in this regard however. He is willing to give the cast a try at this point although he notes that he is a little nervous about this just with regard to balance although he will be very careful and obviously if he has any trouble he knows to contact the office and let me know. 1/22; patient is in for his obligatory first total contact cast change. Our intake nurse reported a very large amount of drainage which is spelled out over to the surrounding skin. Has bilateral diabetic foot wounds. He has Charcot feet. We have been using silver alginate on his wounds. 11/14/2019 on evaluation today patient is actually seeming to make good improvement in regard to his bilateral plantar foot wounds. We have been using a cast on the left side and on the right side he has been using dressings he is changing up his own accord. With that being said he tells  me that he is also not walking as much just due to how unsteady he feels. He takes it easy when he does have to walk and when he does not have to walk he is resting. This is probably help  in his right foot as well has the left foot which is actually measuring better. In fact both are measuring better. Overall I am very pleased with how things seem to be progressing. The patient does have some odor on the left foot this does have me concerned about the possibility of infection, and actually probably go ahead and put him on antibiotics today as well as utilizing a continuation of the cast on the left foot I think that will be fine we probably just need to bring him in sooner to change this not last a whole week. 1/29; we brought the patient back today for a total contact cast change on the left out of concern for excessive drainage. We are using drawtex over the wound as the primary dressing 11/21/2019 on evaluation today patient appears to be doing well with regard to his left plantar foot. In fact both foot ulcers actually seem to be doing pretty well. Nonetheless he is having a lot of drainage on the left at this time and again we did obtain a wound culture did show positive for Staph aureus that was reviewed by myself today as well. Nonetheless he is on Bactrim which was shown to be sensitive that should be helping in this regard. Fortunately there is no signs of infection systemically at this point. 2/5; back in clinic today for a total contact cast change apparently secondary to very significant drainage. Still using drawtex 11/28/2019 upon evaluation today patient appears to be doing well with regard to his wounds. The right foot is doing okay as measured about the same in my opinion. The left foot is actually showing signs of significant improvement is measuring smaller there is a lot of hyper granulation likely due to the continued drainage at this point. We did obtain approval for a snap VAC I think that is good to be appropriate for him and will likely help this tremendously underneath the cast. He is definitely in agreement with proceeding with such. 2/12; patient came in  today after his snap VAC lost suction. Brought in to see one of our nurses. The dressing was replaced and then we put the cast back on and rehooked up the snap VAC. Apparently his wound looked very good per our intake nurse. 2/15; again we replaced the cast on Friday. By Saturday the snap VAC and light suction. He called this morning he comes in acutely. The wounds look fine however the VAC is not functioning. We replaced the cast using silver alginate as the primary dressing backed with Kerramax. The snap VAC was not replaced 12/05/2019 upon evaluation today patient appears to be doing better in regard to his left plantar foot ulcer. Fortunately there is no signs of active infection at this time. Unfortunately he is continuing to have issues with the right foot he is really not making any progress here things seem to be somewhat stagnant to be honest. The depth has increased but that is due to me having debrided the wound in the past based on what I am seeing. Electronic Signature(s) Signed: 12/05/2019 4:07:26 PM By: Lenda Kelp PA-C Entered By: Lenda Kelp on 12/05/2019 16:07:26 -------------------------------------------------------------------------------- Physical Exam Details Patient Name: Date of Service: [name], [name] 12/05/2019 2:30 PM Medical Record  [id number] Patient Account Number: [id number] Date of Birth/Sex: Treating RN: 04-Dec-1960 (59 y.o. [name] Primary Care Provider: [name] Other Clinician: Referring Provider: Treating Provider/Extender:Stone III, Dayton Martes, Stacy Weeks in Treatment: 5 Constitutional Well-nourished and well-hydrated in no acute distress. Respiratory normal breathing without difficulty. Psychiatric this patient is able to make decisions and demonstrates good insight into disease process. Alert and Oriented x 3. pleasant and cooperative. Notes Patient's wound bed currently on the left showed some signs of hyper  granulation not as much as previous and I do feel like he is making good progress I am very pleased in this regard. With that being said on the right foot he still appears to be doing about the same I am really not see any signs of active infection at this time which is good news no fevers, chills, nausea, vomiting, or diarrhea. I think he may potentially benefit from reGranix that we could use on the right plantar foot. Electronic Signature(s) Signed: 12/05/2019 4:08:01 PM By: Lenda Kelp PA-C Entered By: Lenda Kelp on 12/05/2019 16:08:01 -------------------------------------------------------------------------------- Physician Orders Details Patient Name: Date of Service: [name], [name] 12/05/2019 2:30 PM Medical Record [id number] Patient Account Number: [id number] Date of Birth/Sex: Treating RN: Feb 21, 1961 (59 y.o. 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 Dressing Change Frequency Wound #1 Left,Plantar Foot Do not change entire dressing for one week. Wound #2 Right Metatarsal head second Change Dressing every other day. Skin Barriers/Peri-Wound Care Wound #1 Left,Plantar Foot Skin Prep Wound Cleansing Wound #1 Left,Plantar Foot May shower with protection. - cast protector Wound #2 Right  Metatarsal head second May shower and wash wound with soap and water. Primary Wound Dressing Wound #1 Left,Plantar Foot Hydrofera Blue - classic moistened with saline Wound #2 Right Metatarsal head second Calcium Alginate with Silver - pack into wound bed to fill space Secondary Dressing Wound #1 Left,Plantar Foot Zetuvit or Kerramax Drawtex Carboflex Wound #2 Right Metatarsal head second Kerlix/Rolled Gauze Dry Gauze Other: - felt callous pad Edema Control Patient to wear own compression stockings - daily to right leg Avoid standing for long periods of time Elevate legs to the level of the heart or above for 30 minutes daily and/or when sitting, a frequency of: Off-Loading Total Contact Cast to Left Lower Extremity - foam padding between left great and 2nd toe Open toe surgical shoe to: - right foot daily Patient Medications Allergies: Claritin Notifications Medication Indication Start End Regranex 12/05/2019 DOSE topical 0.01 % gel - gel topical Apply nickel thick to the wound bed and then cover with a dressing as directed in clinic daily Electronic Signature(s) Signed: 12/05/2019 4:12:24 PM By: Lenda Kelp PA-C Entered By: Lenda Kelp on 12/05/2019 16:12:23 -------------------------------------------------------------------------------- Problem List Details Patient Name: Date of Service: [name], [name] 12/05/2019 2:30 PM Medical Record [id number] Patient Account Number: [id number] Date of Birth/Sex: Treating RN: [birth year] (59 y.o. [name] Primary Care Provider: [name] Other Clinician: Referring Provider: Treating  Provider/Extender:Stone III, Elby Showers, Stacy Weeks in Treatment: 5 Active Problems ICD-10 Evaluated Encounter Code Description Active Date Today Diagnosis E11.622 Type 2 diabetes mellitus with other skin ulcer 10/31/2019 No Yes L97.522 Non-pressure chronic ulcer of other part of left foot 10/31/2019 No Yes with fat layer  exposed L97.512 Non-pressure chronic ulcer of other part of right foot 10/31/2019 No Yes with fat layer exposed I10 Essential (primary) hypertension 10/31/2019 No Yes I50.42 Chronic combined systolic (congestive) and diastolic 1/63/8466 No Yes (congestive) heart failure I87.2 Venous insufficiency (chronic) (peripheral) 10/31/2019 No Yes L84 Corns and callosities 11/14/2019 No Yes Inactive Problems Resolved Problems Electronic Signature(s) Signed: 12/05/2019 3:51:20 PM By: Worthy Keeler PA-C Entered By: Worthy Keeler on 12/05/2019 15:51:20 -------------------------------------------------------------------------------- Progress Note Details Patient Name: Date of Service: [name], [name] 12/05/2019 2:30 PM Medical Record [id number] Patient Account Number: [id number] Date of Birth/Sex: Treating RN: 1961/06/09 (59 y.o. [name] Primary Care Provider: Billey Gosling Other Clinician: Referring Provider: Treating Provider/Extender:Stone III, Elby Showers, Stacy Weeks in Treatment: 5 Subjective Chief Complaint Information obtained from Patient Bilateral foot diabetic ulcers History of Present Illness (HPI) 10/31/2019 upon evaluation today patient appears to be doing somewhat poorly in regard to his bilateral plantar feet. He has wounds that he tells me have been present since 2012 intermittently off and on. Most recently this has been open for at least the past 6 months to a year. He has been trying to treat this in different ways using Santyl along with various other dressings including Medihoney and even at one point Xeroform. Nothing really has seem to get this completely closed. He was recently in the hospital for cellulitis of his leg subsequently he did have x-rays as well as MRIs that showed negative for any signs of osteomyelitis in regard to the wounds on his feet. Fortunately there is no signs of systemic infection at this time. No fevers, chills, nausea, vomiting, or  diarrhea. Patient has previously used Darco offloading shoes as far as frontal floaters as well as postop surgical shoes. He has never been in a total contact cast that may be something we need to strongly consider here. Patient's most recent hemoglobin A1c 1 month ago was 5.3 seems to be very well controlled which is great. Subsequently he has seen vascular as well as podiatry. His ABIs are 1.07 on the left and 1.14 on the right he seems to be doing well he does have chronic venous stasis. 11/07/2019 upon evaluation today patient appears to be doing well with regard to his wounds all things considered. I do not see any severe worsening he still has some callus buildup on the right more than the left he notes that he has been probably more active than he should as far as walking is concerned is just very hard to not be active. He knows he needs to be more careful in this regard however. He is willing to give the cast a try at this point although he notes that he is a little nervous about this just with regard to balance although he will be very careful and obviously if he has any trouble he knows to contact the office and let me know. 1/22; patient is in for his obligatory first total contact cast change. Our intake nurse reported a very large amount of drainage which is spelled out over to the surrounding skin. Has bilateral diabetic foot wounds. He has Charcot feet. We have been using silver alginate on his wounds. 11/14/2019 on evaluation today  patient is actually seeming to make good improvement in regard to his bilateral plantar foot wounds. We have been using a cast on the left side and on the right side he has been using dressings he is changing up his own accord. With that being said he tells me that he is also not walking as much just due to how unsteady he feels. He takes it easy when he does have to walk and when he does not have to walk he is resting. This is probably help in his right  foot as well has the left foot which is actually measuring better. In fact both are measuring better. Overall I am very pleased with how things seem to be progressing. The patient does have some odor on the left foot this does have me concerned about the possibility of infection, and actually probably go ahead and put him on antibiotics today as well as utilizing a continuation of the cast on the left foot I think that will be fine we probably just need to bring him in sooner to change this not last a whole week. 1/29; we brought the patient back today for a total contact cast change on the left out of concern for excessive drainage. We are using drawtex over the wound as the primary dressing 11/21/2019 on evaluation today patient appears to be doing well with regard to his left plantar foot. In fact both foot ulcers actually seem to be doing pretty well. Nonetheless he is having a lot of drainage on the left at this time and again we did obtain a wound culture did show positive for Staph aureus that was reviewed by myself today as well. Nonetheless he is on Bactrim which was shown to be sensitive that should be helping in this regard. Fortunately there is no signs of infection systemically at this point. 2/5; back in clinic today for a total contact cast change apparently secondary to very significant drainage. Still using drawtex 11/28/2019 upon evaluation today patient appears to be doing well with regard to his wounds. The right foot is doing okay as measured about the same in my opinion. The left foot is actually showing signs of significant improvement is measuring smaller there is a lot of hyper granulation likely due to the continued drainage at this point. We did obtain approval for a snap VAC I think that is good to be appropriate for him and will likely help this tremendously underneath the cast. He is definitely in agreement with proceeding with such. 2/12; patient came in today after his  snap VAC lost suction. Brought in to see one of our nurses. The dressing was replaced and then we put the cast back on and rehooked up the snap VAC. Apparently his wound looked very good per our intake nurse. 2/15; again we replaced the cast on Friday. By Saturday the snap VAC and light suction. He called this morning he comes in acutely. The wounds look fine however the VAC is not functioning. We replaced the cast using silver alginate as the primary dressing backed with Kerramax. The snap VAC was not replaced 12/05/2019 upon evaluation today patient appears to be doing better in regard to his left plantar foot ulcer. Fortunately there is no signs of active infection at this time. Unfortunately he is continuing to have issues with the right foot he is really not making any progress here things seem to be somewhat stagnant to be honest. The depth has increased but that is due to  me having debrided the wound in the past based on what I am seeing. Objective Constitutional Well-nourished and well-hydrated in no acute distress. Vitals Time Taken: 3:10 PM, Height: 73 in, Weight: 260 lbs, BMI: 34.3, Temperature: 98.5 F, Pulse: 116 bpm, Respiratory Rate: 20 breaths/min, Blood Pressure: 148/83 mmHg. Respiratory normal breathing without difficulty. Psychiatric this patient is able to make decisions and demonstrates good insight into disease process. Alert and Oriented x 3. pleasant and cooperative. General Notes: Patient's wound bed currently on the left showed some signs of hyper granulation not as much as previous and I do feel like he is making good progress I am very pleased in this regard. With that being said on the right foot he still appears to be doing about the same I am really not see any signs of active infection at this time which is good news no fevers, chills, nausea, vomiting, or diarrhea. I think he may potentially benefit from reGranix that we could use on the right plantar  foot. Integumentary (Hair, Skin) Wound #1 status is Open. Original cause of wound was Gradually Appeared. The wound is located on the Left,Plantar Foot. The wound measures 2.5cm length x 2.6cm width x 0.1cm depth; 5.105cm^2 area and 0.511cm^3 volume. There is Fat Layer (Subcutaneous Tissue) Exposed exposed. There is no tunneling or undermining noted. There is a medium amount of serosanguineous drainage noted. The wound margin is flat and intact. There is large (67-100%) pink, pale granulation within the wound bed. There is no necrotic tissue within the wound bed. Wound #2 status is Open. Original cause of wound was Gradually Appeared. The wound is located on the Right Metatarsal head second. The wound measures 2.3cm length x 1.8cm width x 1.3cm depth; 3.252cm^2 area and 4.227cm^3 volume. There is Fat Layer (Subcutaneous Tissue) Exposed exposed. There is no tunneling or undermining noted. There is a medium amount of serosanguineous drainage noted. The wound margin is flat and intact. There is large (67-100%) red, pink granulation within the wound bed. There is no necrotic tissue within the wound bed. 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 There was a Total Contact Cast Procedure by Lenda Kelp, PA. Post procedure Diagnosis Wound #1: Same as Pre-Procedure Plan Follow-up Appointments: Return Appointment in 1 week. Dressing Change Frequency: Wound #1 Left,Plantar Foot: Do not  change entire dressing for one week. Wound #2 Right Metatarsal head second: Change Dressing every other day. Skin Barriers/Peri-Wound Care: Wound #1 Left,Plantar Foot: Skin Prep Wound Cleansing: Wound #1 Left,Plantar Foot: May shower with protection. - cast protector Wound #2 Right Metatarsal head second: May shower and wash wound with soap and water. Primary Wound Dressing: Wound #1 Left,Plantar Foot: Hydrofera Blue - classic moistened with saline Wound #2 Right Metatarsal head second: Calcium Alginate with Silver - pack into wound bed to fill space Secondary Dressing: Wound #1 Left,Plantar Foot: Zetuvit or Kerramax Drawtex Carboflex Wound #2 Right Metatarsal head second: Kerlix/Rolled Gauze Dry Gauze Other: - felt callous pad Edema Control: Patient to wear own compression stockings - daily to right leg Avoid standing for long periods  of time Elevate legs to the level of the heart or above for 30 minutes daily and/or when sitting, a frequency of: Off-Loading: Total Contact Cast to Left Lower Extremity - foam padding between left great and 2nd toe Open toe surgical shoe to: - right foot daily The following medication(s) was prescribed: Regranex topical 0.01 % gel gel topical Apply nickel thick to the wound bed and then cover with a dressing as directed in clinic daily starting 12/05/2019 1. I would recommend currently that we [facility] and total contact cast for the left plantar foot ulcer he seems to be doing well in that regard. 2. I would recommend as well that with regard to the right plantar foot we try Regranex to see if this can be beneficial for him. The patient is definitely in agreement with that plan. Otherwise in the interim he will continue with the current wound care measures. We will see patient back for reevaluation in 1 week here in the clinic. If anything worsens or changes patient will contact our office for additional  recommendations. Electronic Signature(s) Signed: 12/05/2019 4:12:41 PM By: Lenda KelpStone III, Hoyt PA-C Entered By: Lenda KelpStone III, Hoyt on 12/05/2019 16:12:40 -------------------------------------------------------------------------------- Total Contact Cast Details Patient Name: Date of Service: [name] BienMILLER, [name] 12/05/2019 2:30 PM Medical Record [id number] Patient Account Number: [id number] Date of Birth/Sex: Treating RN: 09/30/1961 (59 y.o. Damaris SchoonerM) Boehlein, Linda Primary Care Provider: Cheryll CockayneBurns, Stacy Other Clinician: Referring Provider: Treating Provider/Extender:Stone III, Dayton MartesHoyt Burns, Duanne LimerickStacy Weeks in Treatment: 5 Total Contact Cast Applied for Wound Assessment: Wound #1 Left,Plantar Foot Performed By: Physician Lenda KelpStone III, Hoyt, PA Post Procedure Diagnosis Same as Pre-procedure Electronic Signature(s) Signed: 12/06/2019 12:37:21 PM By: Lenda KelpStone III, Hoyt PA-C Signed: 12/07/2019 6:14:01 PM By: Zenaida DeedBoehlein, Linda RN, BSN Entered By: Zenaida DeedBoehlein, Linda on 12/05/2019 15:58:27 -------------------------------------------------------------------------------- SuperBill Details Patient Name: Date of Service: [name] BienMILLER, [name] 12/05/2019 Medical Record [id number] Patient Account Number: [id number] Date of Birth/Sex: Treating RN: 05/21/1961 (59 y.o. 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 I10 Essential (primary) hypertension I50.42 Chronic combined systolic (congestive) and diastolic (congestive) heart failure I87.2 Venous insufficiency (chronic) (peripheral) L84 Corns and callosities Facility Procedures CPT4 Code  Description: [id number] 29445 - APPLY TOTAL CONTACT LEG CAST ICD-10 Diagnosis Description L97.522 Non-pressure chronic ulcer of other part of left foot with Modifier: fat layer expo Quantity: 1 sed Physician Procedures CPT4 Code Description: [id number] 99214 - WC PHYS LEVEL 4 - EST PT ICD-10 Diagnosis Description E11.622 Type 2 diabetes mellitus with other skin ulcer L97.522 Non-pressure chronic ulcer of other part of left foot wit L97.512 Non-pressure chronic ulcer of  other part of right foot wi I10 Essential (primary) hypertension Modifier: 25 h fat layer exp th fat layer ex Quantity: 1 osed posed CPT4 Code Description: [id number] 29445 - WC PHYS APPLY TOTAL CONTACT CAST ICD-10 Diagnosis Description L97.522 Non-pressure chronic ulcer of other part of left foot with f Modifier: at layer expose Quantity: 1 d Electronic Signature(s) Signed: 12/05/2019 4:13:08 PM By: Lenda KelpStone III, Hoyt PA-C Entered By: Lenda KelpStone III, Hoyt on 12/05/2019 16:13:08

## 2019-12-07 NOTE — Progress Notes (Signed)
[name], [name] ([id number]) Visit Report for 12/05/2019 Arrival Information Details Patient Name: Date of Service: [name], [name] 12/05/2019 2:30 PM Medical Record [id number] Patient Account Number: [id number] Date of Birth/Sex: Treating RN: 1961/07/02 (59 y.o. [name] Primary Care Ellagrace Yoshida: [name] Other Clinician: Referring Krystale Rinkenberger: Treating Glenda Kunst/Extender:Stone III, Dayton Martes, Stacy Weeks in Treatment: 5 Visit Information History Since Last Visit Added or deleted any medications: No Patient Arrived: [name] Any new allergies or adverse reactions: No Arrival Time: 15:10 Had a fall or experienced change in No Accompanied By: self activities of daily living that may affect Transfer Assistance: None risk of falls: Patient Identification Verified: Yes Signs or symptoms of abuse/neglect No Secondary Verification Process Yes since last visito Completed: Hospitalized since last visit: No Patient Requires Transmission-Based No Implantable device outside of the clinic No Precautions: excluding Patient Has Alerts: Yes cellular tissue based products placed in Patient Alerts: L ABI 1.07 the center (04/2019) since last visit: R ABI 1.14 Has Dressing in Place as Prescribed: Yes (04/2019) Has Footwear/Offloading in Place as Yes Prescribed: Left: Total Contact Cast Pain Present Now: No Electronic Signature(s) Signed: 12/05/2019 5:44:15 PM By: Shawn Stall Entered By: Shawn Stall on 12/05/2019 15:10:39 -------------------------------------------------------------------------------- Encounter Discharge Information Details Patient Name: Date of Service: [name], [name] 12/05/2019 2:30 PM Medical Record [id number] Patient Account Number: [id number] Date of Birth/Sex: Treating RN: Sep 28, 1961 (58 y.o. [name] Primary Care Fia Hebert: [name] Other Clinician: Referring Jerry Clyne: Treating Geraline Halberstadt/Extender:Stone III, Dayton Martes, Stacy Weeks in  Treatment: 5 Encounter Discharge Information Items Discharge Condition: Stable Ambulatory Status: Ambulatory Discharge Destination: Home Transportation: Private Auto Accompanied By: self Schedule Follow-up Appointment: Yes Clinical Summary of Care: Patient Declined Electronic Signature(s) Signed: 12/05/2019 5:45:19 PM By: Yevonne Pax RN Entered By: Yevonne Pax on 12/05/2019 16:39:53 -------------------------------------------------------------------------------- Lower Extremity Assessment Details Patient Name: Date of Service: [name], [name] 12/05/2019 2:30 PM Medical Record [id number] Patient Account Number: [id number] Date of Birth/Sex: Treating RN: 06-25-1961 (59 y.o. [name] Primary Care Brnadon Eoff: [name] Other Clinician: Referring Salah Nakamura: Treating Han Vejar/Extender:Stone III, Dayton Martes, Stacy Weeks in Treatment: 5 Edema Assessment Assessed: [Left: Yes] [Right: Yes] Edema: [Left: Yes] [Right: Yes] Calf Left: Right: Point of Measurement: 30 cm From Medial Instep 39 cm 41 cm Ankle Left: Right: Point of Measurement: 11 cm From Medial Instep 25 cm 29 cm Vascular Assessment Pulses: Dorsalis Pedis Palpable: [Left:Yes] [Right:Yes] Electronic Signature(s) Signed: 12/05/2019 5:44:15 PM By: Shawn Stall Entered By: Shawn Stall on 12/05/2019 15:18:55 -------------------------------------------------------------------------------- Multi-Disciplinary Care Plan Details Patient Name: Date of Service: [name], [name] 12/05/2019 2:30 PM Medical Record [id number] Patient Account Number: [id number] Date of Birth/Sex: Treating RN: April 11, 1961 (59 y.o. [name] Primary Care Toddy Boyd: [name] Other Clinician: Referring Ioana Louks: Treating Manasseh Pittsley/Extender:Stone III, Dayton Martes, Stacy Weeks in Treatment: 5 Active Inactive Nutrition Nursing Diagnoses: Potential for alteratiion in Nutrition/Potential for imbalanced  nutrition Goals: Patient/caregiver will maintain therapeutic glucose control Date Initiated: 10/31/2019 Target Resolution Date: 12/26/2019 Goal Status: Active Interventions: Assess HgA1c results as ordered upon admission and as needed Provide education on elevated blood sugars and impact on wound healing Treatment Activities: Patient referred to Primary Care Physician for further nutritional evaluation : 10/31/2019 Notes: Wound/Skin Impairment Nursing Diagnoses: Impaired tissue integrity Knowledge deficit related to ulceration/compromised skin integrity Goals: Patient/caregiver will verbalize understanding of skin care regimen Date Initiated: 10/31/2019 Target Resolution Date: 12/26/2019 Goal Status: Active Ulcer/skin breakdown will have a volume reduction of 30% by week 4 Date Initiated: 10/31/2019 Date Inactivated: 11/28/2019 Target Resolution Date: 11/28/2019 Unmet Reason: left  Goal Status: Unmet improving, right unable to offload Ulcer/skin breakdown will have a volume reduction of 50% by week 8 Date Initiated: 11/28/2019 Target Resolution Date: 12/26/2019 Goal Status: Active Interventions: Assess patient/caregiver ability to obtain necessary supplies Assess patient/caregiver ability to perform ulcer/skin care regimen upon admission and as needed Assess ulceration(s) every visit Provide education on ulcer and skin care Treatment Activities: Skin care regimen initiated : 10/31/2019 Topical wound management initiated : 10/31/2019 Notes: Electronic Signature(s) Signed: 12/07/2019 6:14:01 PM By: Baruch Gouty RN, BSN Entered By: Baruch Gouty on 12/05/2019 15:56:28 -------------------------------------------------------------------------------- Pain Assessment Details Patient Name: Date of Service: [name], [name] 12/05/2019 2:30 PM Medical Record [id number] Patient Account Number: [id number] Date of Birth/Sex: Treating RN: [birth year] (59 y.o. [name] Primary Care  Honest Vanleer: Billey Gosling Other Clinician: Referring Patrece Tallie: Treating Lawerence Dery/Extender:Stone III, Elby Showers, Stacy Weeks in Treatment: 5 Active Problems Location of Pain Severity and Description of Pain Patient Has Paino No Site Locations Rate the pain. Current Pain Level: 0 Pain Management and Medication Current Pain Management: Medication: No Cold Application: No Rest: No Massage: No Activity: No T.E.N.S.: No Heat Application: No Leg drop or elevation: No Is the Current Pain Management Adequate: Adequate How does your wound impact your activities of daily livingo Sleep: No Bathing: No Appetite: No Relationship With Others: No Bladder Continence: No Emotions: No Bowel Continence: No Work: No Toileting: No Drive: No Dressing: No Hobbies: No Electronic Signature(s) Signed: 12/05/2019 5:44:15 PM By: Deon Pilling Entered By: Deon Pilling on 12/05/2019 15:10:48 -------------------------------------------------------------------------------- Patient/Caregiver Education Details Patient Name: Date of Service: [name], [name] 2/17/2021andnbsp2:30 PM Medical Record [id number] Patient Account Number: [id number] Date of Birth/Gender: Feb 26, 1961 (58 y.o. M) Treating RN: Baruch Gouty Primary Care Physician: Billey Gosling Other Clinician: Referring Physician: Treating Physician/Extender:Stone III, Elby Showers, Nadine Counts in Treatment: 5 Education Assessment Education Provided To: Patient Education Topics Provided Elevated Blood Sugar/ Impact on Healing: Methods: Explain/Verbal Responses: Reinforcements needed, State content correctly Offloading: Methods: Explain/Verbal Responses: Reinforcements needed, State content correctly Wound/Skin Impairment: Methods: Explain/Verbal Responses: Reinforcements needed, State content correctly Electronic Signature(s) Signed: 12/07/2019 6:14:01 PM By: Baruch Gouty RN, BSN Entered By: Baruch Gouty on 12/05/2019  15:56:58 -------------------------------------------------------------------------------- Wound Assessment Details Patient Name: Date of Service: [name], [name] 12/05/2019 2:30 PM Medical Record [id number] Patient Account Number: [id number] Date of Birth/Sex: Treating RN: [birth year] (59 y.o. [name] Primary Care Savas Elvin: Other Clinician: Billey Gosling Referring Eevee Borbon: Treating Reigna Ruperto/Extender:Stone III, Elby Showers, Stacy Weeks in Treatment: 5 Wound Status Wound Number: 1 Primary Diabetic Wound/Ulcer of the Lower Extremity Etiology: Wound Location: Left Foot - Plantar Wound Open Wounding Event: Gradually Appeared Status: Date Acquired: 02/16/2019 Comorbid Anemia, Sleep Apnea, Congestive Heart Weeks Of Treatment: 5 History: Failure, Hypertension, Peripheral Venous Clustered Wound: No Disease, Cirrhosis , Type II Diabetes, Osteoarthritis, Neuropathy Photos Wound Measurements Length: (cm) 2.5 Width: (cm) 2.6 Depth: (cm) 0.1 Area: (cm) 5.105 Volume: (cm) 0.511 Wound Description Classification: Grade 2 Wound Margin: Flat and Intact Exudate Amount: Medium Exudate Type: Serosanguineous Exudate Color: red, brown Wound Bed Granulation Amount: Large (67-100%) Granulation Quality: Pink, Pale Necrotic Amount: None Present (0%) Cleansing: No Yes Exposed Structure No taneous Tissue) Exposed: Yes No No No No % Reduction in Area: 43% % Reduction in Volume: 91.8% Epithelialization: Small (1-33%) Tunneling: No Undermining: No Foul Odor After Slough/Fibrino Fascia Exposed: Fat Layer (Subcu Tendon Exposed: Muscle Exposed: Joint Exposed: Bone Exposed: Treatment Notes Wound #1 (Left, Plantar Foot) 1. Cleanse With Wound Cleanser 3. Primary Dressing Applied Hydrofera Blue 4. Secondary Dressing ABD Pad  Dry Gauze Roll Gauze Drawtex 7. Footwear/Offloading device applied Total Contact Cast Electronic Signature(s) Signed: 12/07/2019 5:06:31 PM By:  Benjaman Kindler EMT/HBOT Signed: 12/07/2019 6:28:18 PM By: Shawn Stall Previous Signature: 12/05/2019 5:44:15 PM Version By: Shawn Stall Entered By: Benjaman Kindler on 12/07/2019 13:21:09 -------------------------------------------------------------------------------- Wound Assessment Details Patient Name: Date of Service: [name], [name] 12/05/2019 2:30 PM Medical Record [id number] Patient Account Number: [id number] Date of Birth/Sex: Treating RN: Nov 04, 1960 (59 y.o. [name] Primary Care Chenell Lozon: [name] Other Clinician: Referring Baylin Cabal: Treating Adaliz Dobis/Extender:Stone III, Dayton Martes, Stacy Weeks in Treatment: 5 Wound Status Wound Number: 2 Primary Diabetic Wound/Ulcer of the Lower Extremity Etiology: Wound Location: Right Metatarsal head second Wound Open Wounding Event: Gradually Appeared Status: Date Acquired: 02/16/2019 Comorbid Anemia, Sleep Apnea, Congestive Heart Weeks Of Treatment: 5 History: Failure, Hypertension, Peripheral Venous Clustered Wound: No Disease, Cirrhosis , Type II Diabetes, Osteoarthritis, Neuropathy Photos Wound Measurements Length: (cm) 2.3 % Reductio Width: (cm) 1.8 % Reductio Depth: (cm) 1.3 Epithelial Area: (cm) 3.252 Tunneling Volume: (cm) 4.227 Undermini Wound Description Classification: Grade 2 Wound Margin: Flat and Intact Exudate Amount: Medium Exudate Type: Serosanguineous Exudate Color: red, brown Wound Bed Granulation Amount: Large (67-100%) Granulation Quality: Red, Pink Necrotic Amount: None Present (0%) Foul Odor After Cleansing: No Slough/Fibrino No Exposed Structure Fascia Exposed: No Fat Layer (Subcutaneous Tissue) Exposed: Yes Tendon Exposed: No Muscle Exposed: No Joint Exposed: No Bone Exposed: No n in Area: -47.9% n in Volume: -60.2% ization: Small (1-33%) : No ng: No Treatment Notes Wound #2 (Right Metatarsal head second) 1. Cleanse With Wound Cleanser 3. Primary Dressing  Applied Calcium Alginate Ag 4. Secondary Dressing ABD Pad Dry Gauze Roll Gauze Drawtex 5. Secured With Secretary/administrator) Signed: 12/07/2019 5:06:31 PM By: Benjaman Kindler EMT/HBOT Signed: 12/07/2019 6:28:18 PM By: Shawn Stall Previous Signature: 12/05/2019 5:44:15 PM Version By: Shawn Stall Entered By: Benjaman Kindler on 12/07/2019 13:20:44 -------------------------------------------------------------------------------- Vitals Details Patient Name: Date of Service: [name], [name] 12/05/2019 2:30 PM Medical Record [id number] Patient Account Number: [id number] Date of Birth/Sex: Treating RN: 05-25-1961 (59 y.o. [name] Primary Care Suzette Flagler: [name] Other Clinician: Referring Elley Harp: Treating Helem Reesor/Extender:Stone III, Dayton Martes, Stacy Weeks in Treatment: 5 Vital Signs Time Taken: 15:10 Temperature (F): 98.5 Height (in): 73 Pulse (bpm): 116 Weight (lbs): 260 Respiratory Rate (breaths/min): 20 Body Mass Index (BMI): 34.3 Blood Pressure (mmHg): 148/83 Reference Range: 80 - 120 mg / dl Electronic Signature(s) Signed: 12/05/2019 5:44:15 PM By: Shawn Stall Entered By: Shawn Stall on 12/05/2019 15:12:18

## 2019-12-12 ENCOUNTER — Encounter (HOSPITAL_BASED_OUTPATIENT_CLINIC_OR_DEPARTMENT_OTHER): Payer: Medicare (Managed Care) | Admitting: Physician Assistant

## 2019-12-12 ENCOUNTER — Other Ambulatory Visit: Payer: Self-pay

## 2019-12-12 DIAGNOSIS — E11622 Type 2 diabetes mellitus with other skin ulcer: Secondary | ICD-10-CM | POA: Diagnosis not present

## 2019-12-12 NOTE — Progress Notes (Addendum)
[name], [name] ([id number]) Visit Report for 12/12/2019 Chief Complaint Document Details Patient Name: Date of Service: [name], [name] 12/12/2019 2:45 PM Medical Record [id number] Patient Account Number: [id number] Date of Birth/Sex: Treating RN: 1960-11-20 (59 y.o. [name] Primary Care Provider: [name] Other Clinician: Referring Provider: Treating Provider/Extender:Stone III, Dayton Martes, Duanne Limerick in Treatment: 6 Information Obtained from: Patient Chief Complaint Bilateral foot diabetic ulcers Electronic Signature(s) Signed: 12/12/2019 3:24:12 PM By: Lenda Kelp PA-C Entered By: Lenda Kelp on 12/12/2019 15:24:11 -------------------------------------------------------------------------------- HPI Details Patient Name: Date of Service: [name], [name] 12/12/2019 2:45 PM Medical Record [id number] Patient Account Number: [id number] Date of Birth/Sex: Treating RN: 1961-03-09 (59 y.o. [name] Primary Care Provider: [name] Other Clinician: Referring Provider: Treating Provider/Extender:Stone III, Dayton Martes, Stacy Weeks in Treatment: 6 History of Present Illness HPI Description: 10/31/2019 upon evaluation today patient appears to be doing somewhat poorly in regard to his bilateral plantar feet. He has wounds that he tells me have been present since 2012 intermittently off and on. Most recently this has been open for at least the past 6 months to a year. He has been trying to treat this in different ways using Santyl along with various other dressings including Medihoney and even at one point Xeroform. Nothing really has seem to get this completely closed. He was recently in the hospital for cellulitis of his leg subsequently he did have x-rays as well as MRIs that showed negative for any signs of osteomyelitis in regard to the wounds on his feet. Fortunately there is no signs of systemic infection at this time. No fevers, chills, nausea,  vomiting, or diarrhea. Patient has previously used Darco offloading shoes as far as frontal floaters as well as postop surgical shoes. He has never been in a total contact cast that may be something we need to strongly consider here. Patient's most recent hemoglobin A1c 1 month ago was 5.3 seems to be very well controlled which is great. Subsequently he has seen vascular as well as podiatry. His ABIs are 1.07 on the left and 1.14 on the right he seems to be doing well he does have chronic venous stasis. 11/07/2019 upon evaluation today patient appears to be doing well with regard to his wounds all things considered. I do not see any severe worsening he still has some callus buildup on the right more than the left he notes that he has been probably more active than he should as far as walking is concerned is just very hard to not be active. He knows he needs to be more careful in this regard however. He is willing to give the cast a try at this point although he notes that he is a little nervous about this just with regard to balance although he will be very careful and obviously if he has any trouble he knows to contact the office and let me know. 1/22; patient is in for his obligatory first total contact cast change. Our intake nurse reported a very large amount of drainage which is spelled out over to the surrounding skin. Has bilateral diabetic foot wounds. He has Charcot feet. We have been using silver alginate on his wounds. 11/14/2019 on evaluation today patient is actually seeming to make good improvement in regard to his bilateral plantar foot wounds. We have been using a cast on the left side and on the right side he has been using dressings he is changing up his own accord. With that being said he tells  me that he is also not walking as much just due to how unsteady he feels. He takes it easy when he does have to walk and when he does not have to walk he is resting. This is probably help  in his right foot as well has the left foot which is actually measuring better. In fact both are measuring better. Overall I am very pleased with how things seem to be progressing. The patient does have some odor on the left foot this does have me concerned about the possibility of infection, and actually probably go ahead and put him on antibiotics today as well as utilizing a continuation of the cast on the left foot I think that will be fine we probably just need to bring him in sooner to change this not last a whole week. 1/29; we brought the patient back today for a total contact cast change on the left out of concern for excessive drainage. We are using drawtex over the wound as the primary dressing 11/21/2019 on evaluation today patient appears to be doing well with regard to his left plantar foot. In fact both foot ulcers actually seem to be doing pretty well. Nonetheless he is having a lot of drainage on the left at this time and again we did obtain a wound culture did show positive for Staph aureus that was reviewed by myself today as well. Nonetheless he is on Bactrim which was shown to be sensitive that should be helping in this regard. Fortunately there is no signs of infection systemically at this point. 2/5; back in clinic today for a total contact cast change apparently secondary to very significant drainage. Still using drawtex 11/28/2019 upon evaluation today patient appears to be doing well with regard to his wounds. The right foot is doing okay as measured about the same in my opinion. The left foot is actually showing signs of significant improvement is measuring smaller there is a lot of hyper granulation likely due to the continued drainage at this point. We did obtain approval for a snap VAC I think that is good to be appropriate for him and will likely help this tremendously underneath the cast. He is definitely in agreement with proceeding with such. 2/12; patient came in  today after his snap VAC lost suction. Brought in to see one of our nurses. The dressing was replaced and then we put the cast back on and rehooked up the snap VAC. Apparently his wound looked very good per our intake nurse. 2/15; again we replaced the cast on Friday. By Saturday the snap VAC and light suction. He called this morning he comes in acutely. The wounds look fine however the VAC is not functioning. We replaced the cast using silver alginate as the primary dressing backed with Kerramax. The snap VAC was not replaced 12/05/2019 upon evaluation today patient appears to be doing better in regard to his left plantar foot ulcer. Fortunately there is no signs of active infection at this time. Unfortunately he is continuing to have issues with the right foot he is really not making any progress here things seem to be somewhat stagnant to be honest. The depth has increased but that is due to me having debrided the wound in the past based on what I am seeing. 12/12/2019 on evaluation today patient appears to be doing more poorly in regard to the left lower extremity. He has some erythema spreading up the side of his foot I am concerned about infection again  at this point. Unfortunately he has been seeing improvement with a total contact cast but I do not think we should put that on today. On his right plantar foot he continues to have significant drainage this is actually measuring deeper I really do not feel like you are making any progress whatsoever. I have prescribed Granix for him unfortunately his insurance apparently was going to cost him a $500 co-pay. Electronic Signature(s) Signed: 12/12/2019 4:42:46 PM By: Lenda KelpStone III, Jerel Sardina PA-C Entered By: Lenda KelpStone III, Welda Azzarello on 12/12/2019 16:42:45 -------------------------------------------------------------------------------- Physical Exam Details Patient Name: Date of Service: [name] BienMILLER, [name] 12/12/2019 2:45 PM Medical Record [id number] Patient  Account Number: [id number] Date of Birth/Sex: Treating RN: 10/28/1960 ([age] y.o. [name] SchoonerM) [name], Linda Primary Care Provider: Cheryll CockayneBurns, Stacy Other Clinician: Referring Provider: Treating Provider/Extender:Stone III, Dayton MartesHoyt Burns, Stacy Weeks in Treatment: 6 Constitutional Well-nourished and well-hydrated in no acute distress. Respiratory normal breathing without difficulty. Psychiatric this patient is able to make decisions and demonstrates good insight into disease process. Alert and Oriented x 3. pleasant and cooperative. Notes Patient's wound currently on the left foot appears to show signs of infection I did obtain a culture today in order to further evaluate at this point. The right foot just seems to be measuring more deeply he has had an x-ray previously did not show obvious signs of osteomyelitis my suggestion however is that we go ahead and look into doing a MRI of his right foot he has had one of the left but not the right. In general I do not think we can put the total contact cast back on today as far as the left foot is concerned despite the fact is doing better I am concerned about this potentially worsening within the cast for we can keep an eye on this. Electronic Signature(s) Signed: 12/12/2019 4:45:01 PM By: Lenda KelpStone III, Katheleen Stella PA-C Entered By: Lenda KelpStone III, Sayed Apostol on 12/12/2019 16:45:01 -------------------------------------------------------------------------------- Physician Orders Details Patient Name: Date of Service: [name] BienMILLER, [name] 12/12/2019 2:45 PM Medical Record [id number] Patient Account Number: [id number] Date of Birth/Sex: Treating RN: 09/14/1961 (59 y.o. [name] SchoonerM) [name], Linda Primary Care Provider: Cheryll CockayneBurns, Stacy Other Clinician: Referring Provider: Treating Provider/Extender:Stone III, Dayton MartesHoyt Burns, Duanne LimerickStacy Weeks in Treatment: 6 Verbal / Phone Orders: No Diagnosis Coding ICD-10 Coding Code Description E11.622 Type 2 diabetes mellitus with other skin ulcer L97.522  Non-pressure chronic ulcer of other part of left foot with fat layer exposed L97.512 Non-pressure chronic ulcer of other part of right foot with fat layer exposed I10 Essential (primary) hypertension I50.42 Chronic combined systolic (congestive) and diastolic (congestive) heart failure I87.2 Venous insufficiency (chronic) (peripheral) L84 Corns and callosities Follow-up Appointments Return Appointment in 1 week. Dressing Change Frequency Wound #1 Left,Plantar Foot Change dressing every day. Wound #2 Right Metatarsal head second Change Dressing every other day. Skin Barriers/Peri-Wound Care Wound #1 Left,Plantar Foot Skin Prep Wound Cleansing Wound #1 Left,Plantar Foot May shower and wash wound with soap and water. Wound #2 Right Metatarsal head second May shower and wash wound with soap and water. Primary Wound Dressing Wound #1 Left,Plantar Foot Hydrofera Blue - classic moistened with saline Wound #2 Right Metatarsal head second Calcium Alginate with Silver - pack into wound bed to fill space Secondary Dressing Wound #1 Left,Plantar Foot Zetuvit or Kerramax Drawtex Carboflex Wound #2 Right Metatarsal head second Kerlix/Rolled Gauze Dry Gauze Other: - felt callous pad Edema Control Patient to wear own compression stockings - daily to right leg Avoid standing for long periods of time Elevate legs to the level of  the heart or above for 30 minutes daily and/or when sitting, a frequency of: Off-Loading Open toe surgical shoe to: - both feet Laboratory Bacteria identified in Unspecified specimen by Anaerobe culture (MICRO) - left foot LOINC Code: 635-3 Convenience Name: Anerobic culture Radiology MRI, lower extremity with/without contrast right foot - diabetic foot ulcer right plantar foot CPT - (ICD10 L97.512 - Non-pressure chronic ulcer of other part of right foot with fat layer exposed) Patient Medications Allergies: Claritin Notifications Medication Indication Start  End doxycycline hyclate 12/12/2019 DOSE 1 - oral 100 mg capsule - 1 capsule oral taken 2 times a day for 14 days. Do not take this medication with a multivitamin Electronic Signature(s) Signed: 12/12/2019 4:47:46 PM By: Lenda Kelp PA-C Entered By: Lenda Kelp on 12/12/2019 16:47:45 -------------------------------------------------------------------------------- Prescription 12/12/2019 Patient Name: [name] Provider: Lenda Kelp PA Date of Birth: [birth year] NPI#: [id number] Sex: M DEA#: [id number] Phone #: [phone] License #: Patient Address: Eligha Bridegroom [facility] [address] [place], [place] [address] [address] [place], [place] [address] [phone] Allergies Claritin Reaction: anxiety Severity: Moderate Provider's Orders MRI, lower extremity with/without contrast right foot - ICD10: L97.512 - diabetic foot ulcer right plantar foot CPT Signature(s): Date(s): Electronic Signature(s) Signed: 12/12/2019 5:55:18 PM By: Lenda Kelp PA-C Entered By: Lenda Kelp on 12/12/2019 16:47:47 --------------------------------------------------------------------------------  Problem List Details Patient Name: Date of Service: [name], [name] 12/12/2019 2:45 PM Medical Record [id number] Patient Account Number: [id number] Date of Birth/Sex: Treating RN: [birth year] (59 y.o. [name] Primary Care Provider: [name] Other Clinician: Referring Provider: Treating Provider/Extender:Stone III, Dayton Martes, Stacy Weeks in Treatment: 6 Active Problems ICD-10 Evaluated Encounter Code Description Active Date Today Diagnosis E11.622 Type 2 diabetes mellitus with other skin ulcer 10/31/2019 No Yes L97.522 Non-pressure chronic ulcer of other part of left foot 10/31/2019 No Yes with fat layer exposed L97.512 Non-pressure chronic ulcer of other part of right foot 10/31/2019 No Yes with fat layer exposed I10  Essential (primary) hypertension 10/31/2019 No Yes I50.42 Chronic combined systolic (congestive) and diastolic 10/31/2019 No Yes (congestive) heart failure I87.2 Venous insufficiency (chronic) (peripheral) 10/31/2019 No Yes L84 Corns and callosities 11/14/2019 No Yes Inactive Problems Resolved Problems Electronic Signature(s) Signed: 12/12/2019 3:23:47 PM By: Lenda Kelp PA-C Entered By: Lenda Kelp on 12/12/2019 15:23:46 -------------------------------------------------------------------------------- Progress Note Details Patient Name: Date of Service: [name], [name] 12/12/2019 2:45 PM Medical Record [id number] Patient Account Number: [id number] Date of Birth/Sex: Treating RN: 01/05/1961 (59 y.o. [name] Primary Care Provider: [name] Other Clinician: Referring Provider: Treating Provider/Extender:Stone III, Dayton Martes, Stacy Weeks in Treatment: 6 Subjective Chief Complaint Information obtained from Patient Bilateral foot diabetic ulcers History of Present Illness (HPI) 10/31/2019 upon evaluation today patient appears to be doing somewhat poorly in regard to his bilateral plantar feet. He has wounds that he tells me have been present since 2012 intermittently off and on. Most recently this has been open for at least the past 6 months to a year. He has been trying to treat this in different ways using Santyl along with various other dressings including Medihoney and even at one point Xeroform. Nothing really has seem to get this completely closed. He was recently in the hospital for cellulitis of his leg subsequently he did have x-rays as well as MRIs that showed negative for any signs of osteomyelitis in regard to the wounds on his feet. Fortunately there is no signs of systemic infection at  this time. No fevers, chills, nausea, vomiting, or diarrhea. Patient has previously used Darco offloading shoes as far as frontal floaters as well as postop surgical  shoes. He has never been in a total contact cast that may be something we need to strongly consider here. Patient's most recent hemoglobin A1c 1 month ago was 5.3 seems to be very well controlled which is great. Subsequently he has seen vascular as well as podiatry. His ABIs are 1.07 on the left and 1.14 on the right he seems to be doing well he does have chronic venous stasis. 11/07/2019 upon evaluation today patient appears to be doing well with regard to his wounds all things considered. I do not see any severe worsening he still has some callus buildup on the right more than the left he notes that he has been probably more active than he should as far as walking is concerned is just very hard to not be active. He knows he needs to be more careful in this regard however. He is willing to give the cast a try at this point although he notes that he is a little nervous about this just with regard to balance although he will be very careful and obviously if he has any trouble he knows to contact the office and let me know. 1/22; patient is in for his obligatory first total contact cast change. Our intake nurse reported a very large amount of drainage which is spelled out over to the surrounding skin. Has bilateral diabetic foot wounds. He has Charcot feet. We have been using silver alginate on his wounds. 11/14/2019 on evaluation today patient is actually seeming to make good improvement in regard to his bilateral plantar foot wounds. We have been using a cast on the left side and on the right side he has been using dressings he is changing up his own accord. With that being said he tells me that he is also not walking as much just due to how unsteady he feels. He takes it easy when he does have to walk and when he does not have to walk he is resting. This is probably help in his right foot as well has the left foot which is actually measuring better. In fact both are measuring better. Overall I am  very pleased with how things seem to be progressing. The patient does have some odor on the left foot this does have me concerned about the possibility of infection, and actually probably go ahead and put him on antibiotics today as well as utilizing a continuation of the cast on the left foot I think that will be fine we probably just need to bring him in sooner to change this not last a whole week. 1/29; we brought the patient back today for a total contact cast change on the left out of concern for excessive drainage. We are using drawtex over the wound as the primary dressing 11/21/2019 on evaluation today patient appears to be doing well with regard to his left plantar foot. In fact both foot ulcers actually seem to be doing pretty well. Nonetheless he is having a lot of drainage on the left at this time and again we did obtain a wound culture did show positive for Staph aureus that was reviewed by myself today as well. Nonetheless he is on Bactrim which was shown to be sensitive that should be helping in this regard. Fortunately there is no signs of infection systemically at this point. 2/5; back in  clinic today for a total contact cast change apparently secondary to very significant drainage. Still using drawtex 11/28/2019 upon evaluation today patient appears to be doing well with regard to his wounds. The right foot is doing okay as measured about the same in my opinion. The left foot is actually showing signs of significant improvement is measuring smaller there is a lot of hyper granulation likely due to the continued drainage at this point. We did obtain approval for a snap VAC I think that is good to be appropriate for him and will likely help this tremendously underneath the cast. He is definitely in agreement with proceeding with such. 2/12; patient came in today after his snap VAC lost suction. Brought in to see one of our nurses. The dressing was replaced and then we put the cast  back on and rehooked up the snap VAC. Apparently his wound looked very good per our intake nurse. 2/15; again we replaced the cast on Friday. By Saturday the snap VAC and light suction. He called this morning he comes in acutely. The wounds look fine however the VAC is not functioning. We replaced the cast using silver alginate as the primary dressing backed with Kerramax. The snap VAC was not replaced 12/05/2019 upon evaluation today patient appears to be doing better in regard to his left plantar foot ulcer. Fortunately there is no signs of active infection at this time. Unfortunately he is continuing to have issues with the right foot he is really not making any progress here things seem to be somewhat stagnant to be honest. The depth has increased but that is due to me having debrided the wound in the past based on what I am seeing. 12/12/2019 on evaluation today patient appears to be doing more poorly in regard to the left lower extremity. He has some erythema spreading up the side of his foot I am concerned about infection again at this point. Unfortunately he has been seeing improvement with a total contact cast but I do not think we should put that on today. On his right plantar foot he continues to have significant drainage this is actually measuring deeper I really do not feel like you are making any progress whatsoever. I have prescribed Granix for him unfortunately his insurance apparently was going to cost him a $500 co-pay. Objective Constitutional Well-nourished and well-hydrated in no acute distress. Vitals Time Taken: 3:25 PM, Height: 73 in, Weight: 260 lbs, BMI: 34.3, Temperature: 98.5 F, Pulse: 101 bpm, Respiratory Rate: 18 breaths/min, Blood Pressure: 134/77 mmHg. Respiratory normal breathing without difficulty. Psychiatric this patient is able to make decisions and demonstrates good insight into disease process. Alert and Oriented x 3. pleasant and cooperative. General  Notes: Patient's wound currently on the left foot appears to show signs of infection I did obtain a culture today in order to further evaluate at this point. The right foot just seems to be measuring more deeply he has had an x-ray previously did not show obvious signs of osteomyelitis my suggestion however is that we go ahead and look into doing a MRI of his right foot he has had one of the left but not the right. In general I do not think we can put the total contact cast back on today as far as the left foot is concerned despite the fact is doing better I am concerned about this potentially worsening within the cast for we can keep an eye on this. Integumentary (Hair, Skin) Wound #1 status is  Open. Original cause of wound was Gradually Appeared. The wound is located on the Left,Plantar Foot. The wound measures 2.7cm length x 2.6cm width x 0.2cm depth; 5.513cm^2 area and 1.103cm^3 volume. There is Fat Layer (Subcutaneous Tissue) Exposed exposed. There is no tunneling or undermining noted. There is a large amount of purulent drainage noted. The wound margin is flat and intact. There is large (67-100%) pink granulation within the wound bed. There is a small (1-33%) amount of necrotic tissue within the wound bed including Adherent Slough. Wound #2 status is Open. Original cause of wound was Gradually Appeared. The wound is located on the Right Metatarsal head second. The wound measures 2.4cm length x 2cm width x 1.5cm depth; 3.77cm^2 area and 5.655cm^3 volume. There is Fat Layer (Subcutaneous Tissue) Exposed exposed. There is no tunneling or undermining noted. There is a medium amount of serosanguineous drainage noted. The wound margin is flat and intact. There is large (67-100%) pink granulation within the wound bed. There is a small (1-33%) amount of necrotic tissue within the wound bed including Adherent Slough. Assessment Active Problems ICD-10 Type 2 diabetes mellitus with other skin  ulcer Non-pressure chronic ulcer of other part of left foot with fat layer exposed Non-pressure chronic ulcer of other part of right foot with fat layer exposed Essential (primary) hypertension Chronic combined systolic (congestive) and diastolic (congestive) heart failure Venous insufficiency (chronic) (peripheral) Corns and callosities Plan Follow-up Appointments: Return Appointment in 1 week. Dressing Change Frequency: Wound #1 Left,Plantar Foot: Change dressing every day. Wound #2 Right Metatarsal head second: Change Dressing every other day. Skin Barriers/Peri-Wound Care: Wound #1 Left,Plantar Foot: Skin Prep Wound Cleansing: Wound #1 Left,Plantar Foot: May shower and wash wound with soap and water. Wound #2 Right Metatarsal head second: May shower and wash wound with soap and water. Primary Wound Dressing: Wound #1 Left,Plantar Foot: Hydrofera Blue - classic moistened with saline Wound #2 Right Metatarsal head second: Calcium Alginate with Silver - pack into wound bed to fill space Secondary Dressing: Wound #1 Left,Plantar Foot: Zetuvit or Kerramax Drawtex Carboflex Wound #2 Right Metatarsal head second: Kerlix/Rolled Gauze Dry Gauze Other: - felt callous pad Edema Control: Patient to wear own compression stockings - daily to right leg Avoid standing for long periods of time Elevate legs to the level of the heart or above for 30 minutes daily and/or when sitting, a frequency of: Off-Loading: Open toe surgical shoe to: - both feet Laboratory ordered were: Anerobic culture - left foot Radiology ordered were: MRI, lower extremity with/without contrast right foot - diabetic foot ulcer right plantar foot CPT The following medication(s) was prescribed: doxycycline hyclate oral 100 mg capsule 1 1 capsule oral taken 2 times a day for 14 days. Do not take this medication with a multivitamin starting 12/12/2019 1. I would recommend currently that we hold the total contact  cast for this week on the left foot patient is in agreement with this plan. I am going to send in a prescription as well for doxycycline for him and a wound culture was obtained today. 2. Also going to suggest that we go ahead and continue with the [facility] dressing for the left lower extremity as I still feel like the hyper granular tissue will respond best to this currently. The patient again is in agreement with that plan. 3. With regard to the right lower extremity we will continue with silver alginate for the time being. He is in agreement with that plan. 4. I do think he  needs to attempt as much as possible to try to keep pressure off of the wounds as much as he can. He does have a wheelchair at home if he can use this to do so I think that will actually make an improvement overall in his symptoms as well. Obviously the less pressure the better chance he has getting these areas to heal. We will see patient back for reevaluation in 1 week here in the clinic. If anything worsens or changes patient will contact our office for additional recommendations. Electronic Signature(s) Signed: 12/12/2019 4:47:54 PM By: Lenda Kelp PA-C Entered By: Lenda Kelp on 12/12/2019 16:47:53 -------------------------------------------------------------------------------- SuperBill Details Patient Name: Date of Service: [name], [name] 12/12/2019 Medical Record [id number] Patient Account Number: [id number] Date of Birth/Sex: Treating RN: [birth year] (59 y.o. [name] Primary Care Provider: [name] Other Clinician: Referring Provider: Treating Provider/Extender:Stone III, Dayton Martes, Stacy Weeks in Treatment: 6 Diagnosis Coding ICD-10 Codes Code Description E11.622 Type 2 diabetes mellitus with other skin ulcer L97.522 Non-pressure chronic ulcer of other part of left foot with fat layer exposed L97.512 Non-pressure chronic ulcer of other part of right foot with fat layer  exposed I10 Essential (primary) hypertension I50.42 Chronic combined systolic (congestive) and diastolic (congestive) heart failure I87.2 Venous insufficiency (chronic) (peripheral) L84 Corns and callosities Facility Procedures CPT4 Code: 41638453 Description: 99214 - WOUND CARE VISIT-LEV 4 EST PT Modifier: Quantity: 1 Physician Procedures CPT4 Code Description: 6468032 99214 - WC PHYS LEVEL 4 - EST PT ICD-10 Diagnosis Description E11.622 Type 2 diabetes mellitus with other skin ulcer L97.522 Non-pressure chronic ulcer of other part of left foot w L97.512 Non-pressure chronic ulcer of  other part of right foot I10 Essential (primary) hypertension Modifier: ith fat layer ex with fat layer e Quantity: 1 posed xposed Electronic Signature(s) Signed: 12/12/2019 4:48:08 PM By: Lenda Kelp PA-C Entered By: Lenda Kelp on 12/12/2019 16:48:07

## 2019-12-13 ENCOUNTER — Other Ambulatory Visit (HOSPITAL_COMMUNITY): Payer: Self-pay | Admitting: Physician Assistant

## 2019-12-13 ENCOUNTER — Other Ambulatory Visit: Payer: Self-pay | Admitting: Physician Assistant

## 2019-12-13 DIAGNOSIS — L97512 Non-pressure chronic ulcer of other part of right foot with fat layer exposed: Secondary | ICD-10-CM

## 2019-12-15 LAB — AEROBIC CULTURE W GRAM STAIN (SUPERFICIAL SPECIMEN)
Culture: NORMAL
Gram Stain: NONE SEEN

## 2019-12-18 NOTE — Progress Notes (Signed)
[name], [name] ([id number]) Visit Report for 12/12/2019 Arrival Information Details Patient Name: Date of Service: [name], [name] 12/12/2019 2:45 PM Medical Record [id number] Patient Account Number: [id number] Date of Birth/Sex: Treating RN: 06/12/1961 (59 y.o. [name] Primary Care Dori Devino: Billey Gosling Other Clinician: Referring Nathin Saran: Treating Janie Strothman/Extender:Stone III, Elby Showers, Stacy Weeks in Treatment: 6 Visit Information History Since Last Visit Added or deleted any medications: No Patient Arrived: [name] Any new allergies or adverse reactions: No Arrival Time: 15:23 Had a fall or experienced change in No Accompanied By: alone activities of daily living that may affect Transfer Assistance: None risk of falls: Patient Identification Verified: Yes Signs or symptoms of abuse/neglect No Secondary Verification Process Yes since last visito Completed: Hospitalized since last visit: No Patient Requires Transmission-Based No Implantable device outside of the clinic No Precautions: excluding Patient Has Alerts: Yes cellular tissue based products placed in Patient Alerts: L ABI 1.07 the center (04/2019) since last visit: R ABI 1.14 Has Dressing in Place as Prescribed: Yes (04/2019) Has Footwear/Offloading in Place as Yes Prescribed: Left: Total Contact Cast Pain Present Now: No Electronic Signature(s) Signed: 12/13/2019 8:56:01 AM By: [name] RN, BSN Entered By: [name] on 12/12/2019 15:25:27 -------------------------------------------------------------------------------- Clinic Level of Care Assessment Details Patient Name: Date of Service: [name], [name] 12/12/2019 2:45 PM Medical Record [id number] Patient Account Number: [id number] Date of Birth/Sex: Treating RN: Jun 13, 1961 (59 y.o. [name] Primary Care Ashly Yepez: Billey Gosling Other Clinician: Referring Anacarolina Evelyn: Treating Raul Torrance/Extender:Stone III, Elby Showers,  Stacy Weeks in Treatment: 6 Clinic Level of Care Assessment Items TOOL 4 Quantity Score []  - Use when only an EandM is performed on FOLLOW-UP visit 0 ASSESSMENTS - Nursing Assessment / Reassessment X - Reassessment of Co-morbidities (includes updates in patient status) 1 10 X - Reassessment of Adherence to Treatment Plan 1 5 ASSESSMENTS - Wound and Skin Assessment / Reassessment []  - Simple Wound Assessment / Reassessment - one wound 0 X - Complex Wound Assessment / Reassessment - multiple wounds 2 5 []  - Dermatologic / Skin Assessment (not related to wound area) 0 ASSESSMENTS - Focused Assessment []  - Circumferential Edema Measurements - multi extremities 0 []  - Nutritional Assessment / Counseling / Intervention 0 X - Lower Extremity Assessment (monofilament, tuning fork, pulses) 1 5 []  - Peripheral Arterial Disease Assessment (using hand held doppler) 0 ASSESSMENTS - Ostomy and/or Continence Assessment and Care []  - Incontinence Assessment and Management 0 []  - Ostomy Care Assessment and Management (repouching, etc.) 0 PROCESS - Coordination of Care X - Simple Patient / Family Education for ongoing care 1 15 []  - Complex (extensive) Patient / Family Education for ongoing care 0 X - Staff obtains Programmer, systems, Records, Test Results / Process Orders 1 10 []  - Staff telephones HHA, Nursing Homes / Clarify orders / etc 0 []  - Routine Transfer to another Facility (non-emergent condition) 0 []  - Routine Hospital Admission (non-emergent condition) 0 []  - New Admissions / Biomedical engineer / Ordering NPWT, Apligraf, etc. 0 []  - Emergency Hospital Admission (emergent condition) 0 X - Simple Discharge Coordination 1 10 []  - Complex (extensive) Discharge Coordination 0 PROCESS - Special Needs []  - Pediatric / Minor Patient Management 0 []  - Isolation Patient Management 0 []  - Hearing / Language / Visual special needs 0 []  - Assessment of Community assistance (transportation, D/C  planning, etc.) 0 []  - Additional assistance / Altered mentation 0 []  - Support Surface(s) Assessment (bed, cushion, seat, etc.) 0 INTERVENTIONS - Wound Cleansing / Measurement []  - Simple  Wound Cleansing - one wound 0 X - Complex Wound Cleansing - multiple wounds 2 5 X - Wound Imaging (photographs - any number of wounds) 1 5 []  - Wound Tracing (instead of photographs) 0 []  - Simple Wound Measurement - one wound 0 X - Complex Wound Measurement - multiple wounds 2 5 INTERVENTIONS - Wound Dressings X - Small Wound Dressing one or multiple wounds 2 10 []  - Medium Wound Dressing one or multiple wounds 0 []  - Large Wound Dressing one or multiple wounds 0 X - Application of Medications - topical 1 5 []  - Application of Medications - injection 0 INTERVENTIONS - Miscellaneous []  - External ear exam 0 X - Specimen Collection (cultures, biopsies, blood, body fluids, etc.) 1 5 X - Specimen(s) / Culture(s) sent or taken to Lab for analysis 1 5 []  - Patient Transfer (multiple staff / / Similar devices) 0 []  - Simple Staple / Suture removal (25 or less) 0 []  - Complex Staple / Suture removal (26 or more) 0 []  - Hypo / Hyperglycemic Management (close monitor of Blood Glucose) 0 []  - Ankle / Brachial Index (ABI) - do not check if billed separately 0 X - Vital Signs 1 5 Has the patient been seen at the hospital within the last three years: Yes Total Score: 130 Level Of Care: New/Established - Level 4 Electronic Signature(s) Signed: 12/12/2019 6:08:21 PM By: RN, BSN Entered By: on 12/12/2019 16:43:08 -------------------------------------------------------------------------------- Encounter Discharge Information Details Patient Name: Date of Service: [name], [name] 12/12/2019 2:45 PM Medical Record Nurse, adult Patient Account Number: Date of Birth/Sex: Treating RN: 1961/06/15 (58 y.o. Primary Care Opie Maclaughlin: 12/14/2019 Other Clinician: Referring Malissia Rabbani: Treating Kion Huntsberry/Extender:Stone III, Zenaida Deed, Stacy Weeks in Treatment: 6 Encounter Discharge Information Items Discharge Condition: Stable Ambulatory Status: Cane Discharge Destination: Home Transportation: Private Auto Accompanied By: self Schedule Follow-up Appointment: Yes Clinical Summary of Care: Patient Declined Electronic Signature(s) Signed: 12/18/2019 9:14:02 AM By: 12/14/2019 RN Entered By: [name] on 12/12/2019 17:23:20 -------------------------------------------------------------------------------- Lower Extremity Assessment Details Patient Name: Date of Service: [name], [name] 12/12/2019 2:45 PM Medical Record 09/19/1961 Patient Account Number: [id number] Date of Birth/Sex: Treating RN: 12-26-1960 (59 y.o. Dayton Martes Primary Care Evelin Cake: 02/17/2020 Other Clinician: Referring Jaelynn Currier: Treating Oron Westrup/Extender:Stone III, Yevonne Pax, Stacy Weeks in Treatment: 6 Edema Assessment Assessed: [Left: No] [Right: No] Edema: [Left: Yes] [Right: Yes] Calf Left: Right: Point of Measurement: 30 cm From Medial Instep 37 cm 39 cm Ankle Left: Right: Point of Measurement: 11 cm From Medial Instep 25 cm 31.4 cm Vascular Assessment Pulses: Dorsalis Pedis Palpable: [Left:Yes] [Right:Yes] Electronic Signature(s) Signed: 12/13/2019 8:56:01 AM By: 12/14/2019 RN, BSN Entered By: [name] on 12/12/2019 15:40:46 -------------------------------------------------------------------------------- Multi-Disciplinary Care Plan Details Patient Name: Date of Service: [name], [name] 12/12/2019 2:45 PM Medical Record 09/19/1961 Patient Account Number: [id number] Date of Birth/Sex: Treating RN: [birth year] (59 y.o. Dayton Martes Primary Care Everlee Quakenbush: 12/15/2019 Other Clinician: Referring Leelyn Jasinski: Treating Jaquise Faux/Extender:Stone III, Zandra Abts, Stacy Weeks in Treatment: 6 Active  Inactive Nutrition Nursing Diagnoses: Potential for alteratiion in Nutrition/Potential for imbalanced nutrition Goals: Patient/caregiver will maintain therapeutic glucose control Date Initiated: 10/31/2019 Target Resolution Date: 12/26/2019 Goal Status: Active Interventions: Assess HgA1c results as ordered upon admission and as needed Provide education on elevated blood sugars and impact on wound healing Treatment Activities: Patient referred to Primary Care Physician for further nutritional evaluation : 10/31/2019 Notes: Wound/Skin Impairment Nursing Diagnoses: Impaired tissue integrity Knowledge deficit  related to ulceration/compromised skin integrity Goals: Patient/caregiver will verbalize understanding of skin care regimen Date Initiated: 10/31/2019 Target Resolution Date: 12/26/2019 Goal Status: Active Ulcer/skin breakdown will have a volume reduction of 30% by week 4 Date Initiated: 10/31/2019 Date Inactivated: 11/28/2019 Target Resolution Date: 11/28/2019 Unmet Reason: left Goal Status: Unmet improving, right unable to offload Ulcer/skin breakdown will have a volume reduction of 50% by week 8 Date Initiated: 11/28/2019 Target Resolution Date: 12/26/2019 Goal Status: Active Interventions: Assess patient/caregiver ability to obtain necessary supplies Assess patient/caregiver ability to perform ulcer/skin care regimen upon admission and as needed Assess ulceration(s) every visit Provide education on ulcer and skin care Treatment Activities: Skin care regimen initiated : 10/31/2019 Topical wound management initiated : 10/31/2019 Notes: Electronic Signature(s) Signed: 12/12/2019 6:08:21 PM By: Zenaida Deed RN, BSN Entered By: Zenaida Deed on 12/12/2019 16:32:01 -------------------------------------------------------------------------------- Pain Assessment Details Patient Name: Date of Service: [name], [name] 12/12/2019 2:45 PM Medical Record [id number] Patient  Account Number: [id number] Date of Birth/Sex: Treating RN: 1960-10-30 (59 y.o. [name] Primary Care Iaan Oregel: [name] Other Clinician: Referring Mahathi Pokorney: Treating Cailin Gebel/Extender:Stone III, Dayton Martes, Stacy Weeks in Treatment: 6 Active Problems Location of Pain Severity and Description of Pain Patient Has Paino No Site Locations Pain Management and Medication Current Pain Management: Electronic Signature(s) Signed: 12/13/2019 8:56:01 AM By: Zandra Abts RN, BSN Entered By: Zandra Abts on 12/12/2019 15:25:58 -------------------------------------------------------------------------------- Patient/Caregiver Education Details Patient Name: Date of Service: [name], [name] 2/24/2021andnbsp2:45 PM Medical Record [id number] Patient Account Number: [id number] Date of Birth/Gender: 1961-06-13 (58 y.o. M) Treating RN: Zenaida Deed Primary Care Physician: [name] Other Clinician: Referring Physician: Treating Physician/Extender:Stone III, Dayton Martes, Duanne Limerick in Treatment: 6 Education Assessment Education Provided To: Patient Education Topics Provided Elevated Blood Sugar/ Impact on Healing: Methods: Explain/Verbal Responses: Reinforcements needed, State content correctly Offloading: Methods: Explain/Verbal Responses: Reinforcements needed, State content correctly Wound/Skin Impairment: Methods: Explain/Verbal Responses: Reinforcements needed, State content correctly Electronic Signature(s) Signed: 12/12/2019 6:08:21 PM By: Zenaida Deed RN, BSN Entered By: Zenaida Deed on 12/12/2019 16:32:39 -------------------------------------------------------------------------------- Wound Assessment Details Patient Name: Date of Service: [name], [name] 12/12/2019 2:45 PM Medical Record [id number] Patient Account Number: [id number] Date of Birth/Sex: Treating RN: January 02, 1961 (59 y.o. [name] Primary Care Deoni Cosey: Other  Clinician: [name] Referring Tamar Lipscomb: Treating Elowen Debruyn/Extender:Stone III, Dayton Martes, Stacy Weeks in Treatment: 6 Wound Status Wound Number: 1 Primary Diabetic Wound/Ulcer of the Lower Extremity Etiology: Wound Location: Left Foot - Plantar Wound Open Wounding Event: Gradually Appeared Status: Date Acquired: 02/16/2019 Comorbid Anemia, Sleep Apnea, Congestive Heart Weeks Of Treatment: 6 History: Failure, Hypertension, Peripheral Venous Clustered Wound: No Disease, Cirrhosis , Type II Diabetes, Osteoarthritis, Neuropathy Photos Wound Measurements Length: (cm) 2.7 Width: (cm) 2.6 Depth: (cm) 0.2 Area: (cm) 5.513 Volume: (cm) 1.103 Wound Description Classification: Grade 2 Wound Margin: Flat and Intact Exudate Amount: Large Exudate Type: Purulent Exudate Color: yellow, brown, green Wound Bed Granulation Amount: Large (67-100%) Granulation Quality: Pink Necrotic Amount: Small (1-33%) Necrotic Quality: Adherent Slough Cleansing: No Yes Exposed Structure No utaneous Tissue) Exposed: Yes No No No No % Reduction in Area: 38.4% % Reduction in Volume: 82.4% Epithelialization: Small (1-33%) Tunneling: No Undermining: No Foul Odor After Slough/Fibrino Fascia Exposed: Fat Layer (Subc Tendon Exposed: Muscle Exposed: Joint Exposed: Bone Exposed: Treatment Notes Wound #1 (Left, Plantar Foot) 1. Cleanse With Wound Cleanser 3. Primary Dressing Applied Hydrofera Blue 4. Secondary Dressing Dry Gauze Roll Gauze Drawtex 5. Secured With Mining engineer) Signed: 12/13/2019 5:25:06 PM By: Benjaman Kindler EMT/HBOT Signed:  12/13/2019 5:28:31 PM By: Zenaida Deed RN, BSN Previous Signature: 12/13/2019 8:56:01 AM Version By: Zandra Abts RN, BSN Entered By: Benjaman Kindler on 12/13/2019 15:40:59 -------------------------------------------------------------------------------- Wound Assessment Details Patient Name: Date of  Service: [name], [name] 12/12/2019 2:45 PM Medical Record [id number] Patient Account Number: [id number] Date of Birth/Sex: Treating RN: 09-Jun-1961 (59 y.o. [name] Primary Care Laressa Bolinger: [name] Other Clinician: Referring Nur Rabold: Treating Shivan Hodes/Extender:Stone III, Dayton Martes, Stacy Weeks in Treatment: 6 Wound Status Wound Number: 2 Primary Diabetic Wound/Ulcer of the Lower Extremity Etiology: Wound Location: Right Metatarsal head second Wound Open Wounding Event: Gradually Appeared Status: Date Acquired: 02/16/2019 Comorbid Anemia, Sleep Apnea, Congestive Heart Weeks Of Treatment: 6 History: Failure, Hypertension, Peripheral Venous Clustered Wound: No Disease, Cirrhosis , Type II Diabetes, Osteoarthritis, Neuropathy Photos Wound Measurements Length: (cm) 2.4 % Reduction Width: (cm) 2 % Reduction Depth: (cm) 1.5 Epitheliali Area: (cm) 3.77 Tunneling: Volume: (cm) 5.655 Underminin Wound Description Classification: Grade 2 Wound Margin: Flat and Intact Exudate Amount: Medium Exudate Type: Serosanguineous Exudate Color: red, brown Wound Bed Granulation Amount: Large (67-100%) Granulation Quality: Pink Necrotic Amount: Small (1-33%) Necrotic Quality: Adherent Slough Foul Odor After Cleansing: No Slough/Fibrino No Exposed Structure Fascia Exposed: No Fat Layer (Subcutaneous Tissue) Exposed: Yes Tendon Exposed: No Muscle Exposed: No Joint Exposed: No Bone Exposed: No in Area: -71.4% in Volume: -114.3% zation: Small (1-33%) No g: No Treatment Notes Wound #2 (Right Metatarsal head second) 1. Cleanse With Wound Cleanser 3. Primary Dressing Applied Calcium Alginate Ag 4. Secondary Dressing ABD Pad Dry Gauze Roll Gauze Electronic Signature(s) Signed: 12/13/2019 5:25:06 PM By: Benjaman Kindler EMT/HBOT Signed: 12/13/2019 5:28:31 PM By: Zenaida Deed RN, BSN Previous Signature: 12/13/2019 8:56:01 AM Version By: Zandra Abts RN,  BSN Entered By: Benjaman Kindler on 12/13/2019 15:40:22 -------------------------------------------------------------------------------- Vitals Details Patient Name: Date of Service: [name], [name] 12/12/2019 2:45 PM Medical Record [id number] Patient Account Number: [id number] Date of Birth/Sex: Treating RN: 1961-08-20 (58 y.o. [name] Primary Care Future Yeldell: [name] Other Clinician: Referring Davan Nawabi: Treating Noreen Mackintosh/Extender:Stone III, Dayton Martes, Stacy Weeks in Treatment: 6 Vital Signs Time Taken: 15:25 Temperature (F): 98.5 Height (in): 73 Pulse (bpm): 101 Weight (lbs): 260 Respiratory Rate (breaths/min): 18 Body Mass Index (BMI): 34.3 Blood Pressure (mmHg): 134/77 Reference Range: 80 - 120 mg / dl Electronic Signature(s) Signed: 12/13/2019 8:56:01 AM By: Zandra Abts RN, BSN Entered By: Zandra Abts on 12/12/2019 15:25:51

## 2019-12-19 ENCOUNTER — Other Ambulatory Visit: Payer: Self-pay

## 2019-12-19 ENCOUNTER — Encounter (HOSPITAL_BASED_OUTPATIENT_CLINIC_OR_DEPARTMENT_OTHER): Payer: Medicare (Managed Care) | Attending: Physician Assistant | Admitting: Physician Assistant

## 2019-12-19 DIAGNOSIS — G4730 Sleep apnea, unspecified: Secondary | ICD-10-CM | POA: Diagnosis not present

## 2019-12-19 DIAGNOSIS — I5042 Chronic combined systolic (congestive) and diastolic (congestive) heart failure: Secondary | ICD-10-CM | POA: Diagnosis not present

## 2019-12-19 DIAGNOSIS — D649 Anemia, unspecified: Secondary | ICD-10-CM | POA: Insufficient documentation

## 2019-12-19 DIAGNOSIS — I110 Hypertensive heart disease with heart failure: Secondary | ICD-10-CM | POA: Diagnosis not present

## 2019-12-19 DIAGNOSIS — I872 Venous insufficiency (chronic) (peripheral): Secondary | ICD-10-CM | POA: Diagnosis not present

## 2019-12-19 DIAGNOSIS — L97522 Non-pressure chronic ulcer of other part of left foot with fat layer exposed: Secondary | ICD-10-CM | POA: Diagnosis present

## 2019-12-19 DIAGNOSIS — L97512 Non-pressure chronic ulcer of other part of right foot with fat layer exposed: Secondary | ICD-10-CM | POA: Insufficient documentation

## 2019-12-19 DIAGNOSIS — E11621 Type 2 diabetes mellitus with foot ulcer: Secondary | ICD-10-CM | POA: Insufficient documentation

## 2019-12-19 DIAGNOSIS — L84 Corns and callosities: Secondary | ICD-10-CM | POA: Diagnosis not present

## 2019-12-19 DIAGNOSIS — M1990 Unspecified osteoarthritis, unspecified site: Secondary | ICD-10-CM | POA: Diagnosis not present

## 2019-12-19 DIAGNOSIS — E1140 Type 2 diabetes mellitus with diabetic neuropathy, unspecified: Secondary | ICD-10-CM | POA: Diagnosis not present

## 2019-12-19 NOTE — Progress Notes (Addendum)
[name], [name] ([id number]) Visit Report for 12/19/2019 Chief Complaint Document Details Patient Name: Date of Service: [name], [name] 12/19/2019 1:00 PM Medical Record [id number] Patient Account Number: [id number] Date of Birth/Sex: Treating RN: October 14, 1961 (59 y.o. [name] Primary Care Provider: [name] Other Clinician: Referring Provider: Treating Provider/Extender:Stone III, Dayton Martes, Stacy Weeks in Treatment: 7 Information Obtained from: Patient Chief Complaint Bilateral foot diabetic ulcers Electronic Signature(s) Signed: 12/19/2019 2:25:04 PM By: Lenda Kelp PA-C Entered By: Lenda Kelp on 12/19/2019 14:25:03 -------------------------------------------------------------------------------- Debridement Details Patient Name: Date of Service: [name], [name] 12/19/2019 1:00 PM Medical Record [id number] Patient Account Number: [id number] Date of Birth/Sex: Treating RN: 12-10-1960 (59 y.o. [name] Primary Care Provider: [name] Other Clinician: Referring Provider: Treating Provider/Extender:Stone III, Dayton Martes, Stacy Weeks in Treatment: 7 Debridement Performed for Wound #2 Right Metatarsal head second Assessment: Performed By: Physician Lenda Kelp, PA Debridement Type: Debridement Severity of Tissue Pre Fat layer exposed Debridement: Level of Consciousness (Pre- Awake and Alert procedure): Pre-procedure Verification/Time Out Taken: Yes - 13:58 Start Time: 13:58 Total Area Debrided (L x W): 2.6 (cm) x 2 (cm) = 5.2 (cm) Tissue and other material Viable, Non-Viable, Callus, Slough, Subcutaneous, Biofilm, Slough debrided: Level: Skin/Subcutaneous Tissue Debridement Description: Excisional Instrument: Curette Bleeding: Minimum Hemostasis Achieved: Pressure End Time: 14:01 Procedural Pain: 0 Post Procedural Pain: 0 Response to Treatment: Procedure was tolerated well Level of Consciousness Awake and  Alert (Post-procedure): Post Debridement Measurements of Total Wound Length: (cm) 2.6 Width: (cm) 2 Depth: (cm) 1.2 Volume: (cm) 4.901 Character of Wound/Ulcer Post Improved Debridement: Severity of Tissue Post Debridement: Fat layer exposed Post Procedure Diagnosis Same as Pre-procedure Electronic Signature(s) Signed: 12/19/2019 4:54:37 PM By: Lenda Kelp PA-C Signed: 12/19/2019 5:54:02 PM By: Zandra Abts RN, BSN Entered By: Zandra Abts on 12/19/2019 14:01:26 -------------------------------------------------------------------------------- HPI Details Patient Name: Date of Service: [name], MCGLONE 12/19/2019 1:00 PM Medical Record [id number] Patient Account Number: [id number] Date of Birth/Sex: Treating RN: 1961/08/19 (59 y.o. [name] Primary Care Provider: [name] Other Clinician: Referring Provider: Treating Provider/Extender:Stone III, Dayton Martes, Stacy Weeks in Treatment: 7 History of Present Illness HPI Description: 10/31/2019 upon evaluation today patient appears to be doing somewhat poorly in regard to his bilateral plantar feet. He has wounds that he tells me have been present since 2012 intermittently off and on. Most recently this has been open for at least the past 6 months to a year. He has been trying to treat this in different ways using Santyl along with various other dressings including Medihoney and even at one point Xeroform. Nothing really has seem to get this completely closed. He was recently in the hospital for cellulitis of his leg subsequently he did have x-rays as well as MRIs that showed negative for any signs of osteomyelitis in regard to the wounds on his feet. Fortunately there is no signs of systemic infection at this time. No fevers, chills, nausea, vomiting, or diarrhea. Patient has previously used Darco offloading shoes as far as frontal floaters as well as postop surgical shoes. He has never been in a total contact cast  that may be something we need to strongly consider here. Patient's most recent hemoglobin A1c 1 month ago was 5.3 seems to be very well controlled which is great. Subsequently he has seen vascular as well as podiatry. His ABIs are 1.07 on the left and 1.14 on the right he seems to be doing well he does have chronic venous stasis. 11/07/2019 upon evaluation today  patient appears to be doing well with regard to his wounds all things considered. I do not see any severe worsening he still has some callus buildup on the right more than the left he notes that he has been probably more active than he should as far as walking is concerned is just very hard to not be active. He knows he needs to be more careful in this regard however. He is willing to give the cast a try at this point although he notes that he is a little nervous about this just with regard to balance although he will be very careful and obviously if he has any trouble he knows to contact the office and let me know. 1/22; patient is in for his obligatory first total contact cast change. Our intake nurse reported a very large amount of drainage which is spelled out over to the surrounding skin. Has bilateral diabetic foot wounds. He has Charcot feet. We have been using silver alginate on his wounds. 11/14/2019 on evaluation today patient is actually seeming to make good improvement in regard to his bilateral plantar foot wounds. We have been using a cast on the left side and on the right side he has been using dressings he is changing up his own accord. With that being said he tells me that he is also not walking as much just due to how unsteady he feels. He takes it easy when he does have to walk and when he does not have to walk he is resting. This is probably help in his right foot as well has the left foot which is actually measuring better. In fact both are measuring better. Overall I am very pleased with how things seem to be  progressing. The patient does have some odor on the left foot this does have me concerned about the possibility of infection, and actually probably go ahead and put him on antibiotics today as well as utilizing a continuation of the cast on the left foot I think that will be fine we probably just need to bring him in sooner to change this not last a whole week. 1/29; we brought the patient back today for a total contact cast change on the left out of concern for excessive drainage. We are using drawtex over the wound as the primary dressing 11/21/2019 on evaluation today patient appears to be doing well with regard to his left plantar foot. In fact both foot ulcers actually seem to be doing pretty well. Nonetheless he is having a lot of drainage on the left at this time and again we did obtain a wound culture did show positive for Staph aureus that was reviewed by myself today as well. Nonetheless he is on Bactrim which was shown to be sensitive that should be helping in this regard. Fortunately there is no signs of infection systemically at this point. 2/5; back in clinic today for a total contact cast change apparently secondary to very significant drainage. Still using drawtex 11/28/2019 upon evaluation today patient appears to be doing well with regard to his wounds. The right foot is doing okay as measured about the same in my opinion. The left foot is actually showing signs of significant improvement is measuring smaller there is a lot of hyper granulation likely due to the continued drainage at this point. We did obtain approval for a snap VAC I think that is good to be appropriate for him and will likely help this tremendously underneath the cast. He  is definitely in agreement with proceeding with such. 2/12; patient came in today after his snap VAC lost suction. Brought in to see one of our nurses. The dressing was replaced and then we put the cast back on and rehooked up the snap VAC.  Apparently his wound looked very good per our intake nurse. 2/15; again we replaced the cast on Friday. By Saturday the snap VAC and light suction. He called this morning he comes in acutely. The wounds look fine however the VAC is not functioning. We replaced the cast using silver alginate as the primary dressing backed with Kerramax. The snap VAC was not replaced 12/05/2019 upon evaluation today patient appears to be doing better in regard to his left plantar foot ulcer. Fortunately there is no signs of active infection at this time. Unfortunately he is continuing to have issues with the right foot he is really not making any progress here things seem to be somewhat stagnant to be honest. The depth has increased but that is due to me having debrided the wound in the past based on what I am seeing. 12/12/2019 on evaluation today patient appears to be doing more poorly in regard to the left lower extremity. He has some erythema spreading up the side of his foot I am concerned about infection again at this point. Unfortunately he has been seeing improvement with a total contact cast but I do not think we should put that on today. On his right plantar foot he continues to have significant drainage this is actually measuring deeper I really do not feel like you are making any progress whatsoever. I have prescribed Granix for him unfortunately his insurance apparently was going to cost him a $500 co-pay. 12/19/2019 upon evaluation today patient actually appears to be doing better in regard to both wounds. With that being said he actually did get the reGranix which he had to pay $500 for. With that being said it does look like that he is actually made some improvement based on what I am seeing at this point with the reGranix. Obviously if he is going to continue this we are going to do something about trying to get him some help in covering the cost. Electronic Signature(s) Signed: 12/26/2019 5:29:37 PM  By: Lenda Kelp PA-C Signed: 01/30/2020 9:05:01 AM By: Zandra Abts RN, BSN Previous Signature: 12/19/2019 3:48:39 PM Version By: Lenda Kelp PA-C Entered By: Zandra Abts on 12/26/2019 08:50:01 -------------------------------------------------------------------------------- Physical Exam Details Patient Name: Date of Service: [name], [name] 12/19/2019 1:00 PM Medical Record [id number] Patient Account Number: [id number] Date of Birth/Sex: Treating RN: 1961-05-02 (59 y.o. [name] Primary Care Provider: [name] Other Clinician: Referring Provider: Treating Provider/Extender:Stone III, Dayton Martes, Stacy Weeks in Treatment: 7 Constitutional Well-nourished and well-hydrated in no acute distress. Respiratory normal breathing without difficulty. Psychiatric this patient is able to make decisions and demonstrates good insight into disease process. Alert and Oriented x 3. pleasant and cooperative. Notes Patient's wounds actually are showing signs of excellent improvement based on what I am seeing at this point. There does not appear to be any evidence of active infection which is also good news. Overall he really does not want to go back into the cast I completely understand and he really did not have the best experience in the world to be honest. He seems to have done very well and is taking it very easy over the past week. The reGranix also seems to be beneficial to be perfectly honest.  Electronic Signature(s) Signed: 12/19/2019 3:49:11 PM By: Lenda Kelp PA-C Entered By: Lenda Kelp on 12/19/2019 15:49:11 -------------------------------------------------------------------------------- Physician Orders Details Patient Name: Date of Service: KEYMON, MCELROY 12/19/2019 1:00 PM Medical Record [id number] Patient Account Number: [id number] Date of Birth/Sex: Treating RN: [birth year] (59 y.o. [name] Primary Care Provider: [name] Other  Clinician: Referring Provider: Treating Provider/Extender:Stone III, Dayton Martes, Stacy Weeks in Treatment: 7 Verbal / Phone Orders: No Diagnosis Coding Follow-up Appointments Return Appointment in 1 week. Dressing Change Frequency Wound #1 Left,Plantar Foot Change dressing every day. Wound #2 Right Metatarsal head second Change dressing every day. Wound Cleansing Wound #1 Left,Plantar Foot May shower and wash wound with soap and water. Wound #2 Right Metatarsal head second May shower and wash wound with soap and water. Primary Wound Dressing Wound #1 Left,Plantar Foot Hydrofera Blue - Patient to apply Regranex at home Wound #2 Right Metatarsal head second Hydrofera Blue - Patient to apply Regranex at home Secondary Dressing Wound #1 Left,Plantar Foot Kerlix/Rolled Gauze Dry Gauze ABD pad Wound #2 Right Metatarsal head second Kerlix/Rolled Gauze Dry Gauze Other: - felt callous pad Edema Control Patient to wear own compression stockings - daily to right leg Avoid standing for long periods of time Elevate legs to the level of the heart or above for 30 minutes daily and/or when sitting, a frequency of: Off-Loading Open toe surgical shoe to: - both feet Electronic Signature(s) Signed: 12/19/2019 4:54:37 PM By: Lenda Kelp PA-C Signed: 12/19/2019 5:54:02 PM By: Zandra Abts RN, BSN Entered By: Zandra Abts on 12/19/2019 14:00:22 -------------------------------------------------------------------------------- Problem List Details Patient Name: Date of Service: [name], [name] 12/19/2019 1:00 PM Medical Record [id number] Patient Account Number: [id number] Date of Birth/Sex: Treating RN: 1960-12-30 (59 y.o. [name] Primary Care Provider: [name] Other Clinician: Referring Provider: Treating Provider/Extender:Stone III, Dayton Martes, Stacy Weeks in Treatment: 7 Active Problems ICD-10 Evaluated Encounter Code Description Active Date Today  Diagnosis E11.622 Type 2 diabetes mellitus with other skin ulcer 10/31/2019 No Yes L97.522 Non-pressure chronic ulcer of other part of left foot 10/31/2019 No Yes with fat layer exposed L97.512 Non-pressure chronic ulcer of other part of right foot 10/31/2019 No Yes with fat layer exposed I10 Essential (primary) hypertension 10/31/2019 No Yes I50.42 Chronic combined systolic (congestive) and diastolic 10/31/2019 No Yes (congestive) heart failure I87.2 Venous insufficiency (chronic) (peripheral) 10/31/2019 No Yes L84 Corns and callosities 11/14/2019 No Yes Inactive Problems Resolved Problems Electronic Signature(s) Signed: 12/19/2019 2:24:58 PM By: Lenda Kelp PA-C Entered By: Lenda Kelp on 12/19/2019 14:24:56 -------------------------------------------------------------------------------- Progress Note Details Patient Name: Date of Service: [name], [name] 12/19/2019 1:00 PM Medical Record [id number] Patient Account Number: [id number] Date of Birth/Sex: Treating RN: September 15, 1961 (59 y.o. [name] Primary Care Provider: [name] Other Clinician: Referring Provider: Treating Provider/Extender:Stone III, Dayton Martes, Stacy Weeks in Treatment: 7 Subjective Chief Complaint Information obtained from Patient Bilateral foot diabetic ulcers History of Present Illness (HPI) 10/31/2019 upon evaluation today patient appears to be doing somewhat poorly in regard to his bilateral plantar feet. He has wounds that he tells me have been present since 2012 intermittently off and on. Most recently this has been open for at least the past 6 months to a year. He has been trying to treat this in different ways using Santyl along with various other dressings including Medihoney and even at one point Xeroform. Nothing really has seem to get this completely closed. He was recently in the hospital for cellulitis of his leg subsequently  he did have x-rays as well as MRIs that showed negative  for any signs of osteomyelitis in regard to the wounds on his feet. Fortunately there is no signs of systemic infection at this time. No fevers, chills, nausea, vomiting, or diarrhea. Patient has previously used Darco offloading shoes as far as frontal floaters as well as postop surgical shoes. He has never been in a total contact cast that may be something we need to strongly consider here. Patient's most recent hemoglobin A1c 1 month ago was 5.3 seems to be very well controlled which is great. Subsequently he has seen vascular as well as podiatry. His ABIs are 1.07 on the left and 1.14 on the right he seems to be doing well he does have chronic venous stasis. 11/07/2019 upon evaluation today patient appears to be doing well with regard to his wounds all things considered. I do not see any severe worsening he still has some callus buildup on the right more than the left he notes that he has been probably more active than he should as far as walking is concerned is just very hard to not be active. He knows he needs to be more careful in this regard however. He is willing to give the cast a try at this point although he notes that he is a little nervous about this just with regard to balance although he will be very careful and obviously if he has any trouble he knows to contact the office and let me know. 1/22; patient is in for his obligatory first total contact cast change. Our intake nurse reported a very large amount of drainage which is spelled out over to the surrounding skin. Has bilateral diabetic foot wounds. He has Charcot feet. We have been using silver alginate on his wounds. 11/14/2019 on evaluation today patient is actually seeming to make good improvement in regard to his bilateral plantar foot wounds. We have been using a cast on the left side and on the right side he has been using dressings he is changing up his own accord. With that being said he tells me that he is also not  walking as much just due to how unsteady he feels. He takes it easy when he does have to walk and when he does not have to walk he is resting. This is probably help in his right foot as well has the left foot which is actually measuring better. In fact both are measuring better. Overall I am very pleased with how things seem to be progressing. The patient does have some odor on the left foot this does have me concerned about the possibility of infection, and actually probably go ahead and put him on antibiotics today as well as utilizing a continuation of the cast on the left foot I think that will be fine we probably just need to bring him in sooner to change this not last a whole week. 1/29; we brought the patient back today for a total contact cast change on the left out of concern for excessive drainage. We are using drawtex over the wound as the primary dressing 11/21/2019 on evaluation today patient appears to be doing well with regard to his left plantar foot. In fact both foot ulcers actually seem to be doing pretty well. Nonetheless he is having a lot of drainage on the left at this time and again we did obtain a wound culture did show positive for Staph aureus that was reviewed by myself today as  well. Nonetheless he is on Bactrim which was shown to be sensitive that should be helping in this regard. Fortunately there is no signs of infection systemically at this point. 2/5; back in clinic today for a total contact cast change apparently secondary to very significant drainage. Still using drawtex 11/28/2019 upon evaluation today patient appears to be doing well with regard to his wounds. The right foot is doing okay as measured about the same in my opinion. The left foot is actually showing signs of significant improvement is measuring smaller there is a lot of hyper granulation likely due to the continued drainage at this point. We did obtain approval for a snap VAC I think that is good to  be appropriate for him and will likely help this tremendously underneath the cast. He is definitely in agreement with proceeding with such. 2/12; patient came in today after his snap VAC lost suction. Brought in to see one of our nurses. The dressing was replaced and then we put the cast back on and rehooked up the snap VAC. Apparently his wound looked very good per our intake nurse. 2/15; again we replaced the cast on Friday. By Saturday the snap VAC and light suction. He called this morning he comes in acutely. The wounds look fine however the VAC is not functioning. We replaced the cast using silver alginate as the primary dressing backed with Kerramax. The snap VAC was not replaced 12/05/2019 upon evaluation today patient appears to be doing better in regard to his left plantar foot ulcer. Fortunately there is no signs of active infection at this time. Unfortunately he is continuing to have issues with the right foot he is really not making any progress here things seem to be somewhat stagnant to be honest. The depth has increased but that is due to me having debrided the wound in the past based on what I am seeing. 12/12/2019 on evaluation today patient appears to be doing more poorly in regard to the left lower extremity. He has some erythema spreading up the side of his foot I am concerned about infection again at this point. Unfortunately he has been seeing improvement with a total contact cast but I do not think we should put that on today. On his right plantar foot he continues to have significant drainage this is actually measuring deeper I really do not feel like you are making any progress whatsoever. I have prescribed Granix for him unfortunately his insurance apparently was going to cost him a $500 co-pay. 12/19/2019 upon evaluation today patient actually appears to be doing better in regard to both wounds. With that being said he actually did get the reGranix which he had to pay $500  for. With that being said it does look like that he is actually made some improvement based on what I am seeing at this point with the reGranix. Obviously if he is going to continue this we are going to do something about trying to get him some help in covering the cost. Objective Constitutional Well-nourished and well-hydrated in no acute distress. Vitals Time Taken: 1:30 PM, Height: 73 in, Weight: 260 lbs, BMI: 34.3, Temperature: 98.1 F, Pulse: 108 bpm, Respiratory Rate: 18 breaths/min, Blood Pressure: 147/85 mmHg. Respiratory normal breathing without difficulty. Psychiatric this patient is able to make decisions and demonstrates good insight into disease process. Alert and Oriented x 3. pleasant and cooperative. General Notes: Patient's wounds actually are showing signs of excellent improvement based on what I am seeing at  this point. There does not appear to be any evidence of active infection which is also good news. Overall he really does not want to go back into the cast I completely understand and he really did not have the best experience in the world to be honest. He seems to have done very well and is taking it very easy over the past week. The reGranix also seems to be beneficial to be perfectly honest. Integumentary (Hair, Skin) Wound #1 status is Open. Original cause of wound was Gradually Appeared. The wound is located on the Left,Plantar Foot. The wound measures 2.2cm length x 2.5cm width x 0.1cm depth; 4.32cm^2 area and 0.432cm^3 volume. There is Fat Layer (Subcutaneous Tissue) Exposed exposed. There is no tunneling or undermining noted. There is a medium amount of serosanguineous drainage noted. The wound margin is flat and intact. There is large (67-100%) pink granulation within the wound bed. There is no necrotic tissue within the wound bed. Wound #2 status is Open. Original cause of wound was Gradually Appeared. The wound is located on the Right Metatarsal head second.  The wound measures 2.6cm length x 2cm width x 1.2cm depth; 4.084cm^2 area and 4.901cm^3 volume. There is Fat Layer (Subcutaneous Tissue) Exposed exposed. There is no tunneling or undermining noted. There is a medium amount of serosanguineous drainage noted. The wound margin is flat and intact. There is large (67-100%) pink granulation within the wound bed. There is no necrotic tissue within the wound bed. 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 There was a Excisional Skin/Subcutaneous Tissue Debridement with a total area of 5.2 sq cm performed by Lenda KelpStone III, Seidy Labreck, PA. With the following instrument(s): Curette to remove Viable and Non-Viable tissue/material. Material removed includes Callus, Subcutaneous Tissue, Slough, and Biofilm. No specimens were taken. A time out was conducted at 13:58, prior to the start of the procedure. A Minimum amount of bleeding was controlled with Pressure. The procedure was tolerated well with a pain level of 0 throughout and a pain level of 0 following the procedure. Post Debridement Measurements: 2.6cm length x 2cm width x 1.2cm depth; 4.901cm^3 volume. Character of Wound/Ulcer Post Debridement is improved. Severity of Tissue Post Debridement is: Fat layer exposed. Post procedure Diagnosis Wound #2: Same as Pre-Procedure Plan Follow-up Appointments: Return Appointment in 1  week. Dressing Change Frequency: Wound #1 Left,Plantar Foot: Change dressing every day. Wound #2 Right Metatarsal head second: Change dressing every day. Wound Cleansing: Wound #1 Left,Plantar Foot: May shower and wash wound with soap and water. Wound #2 Right Metatarsal head second: May shower and wash wound with soap and water. Primary Wound Dressing: Wound #1 Left,Plantar Foot: Hydrofera Blue - Patient to apply Regranex at home Wound #2 Right Metatarsal head second: Hydrofera Blue - Patient to apply Regranex at home Secondary Dressing: Wound #1 Left,Plantar Foot: Kerlix/Rolled Gauze Dry Gauze ABD pad Wound #2 Right Metatarsal head second: Kerlix/Rolled Gauze Dry Gauze Other: - felt callous pad Edema Control: Patient to wear own compression stockings - daily to right leg  Avoid standing for long periods of time Elevate legs to the level of the heart or above for 30 minutes daily and/or when sitting, a frequency of: Off-Loading: Open toe surgical shoe to: - both feet 1. My suggestion at this time is going to be that we go ahead and continue with the [employer] [facility] [employer] dressing. I think we use this at both locations and he can use some of the Regranex underneath this. 2. I am also going to suggest that he avoid standing for long periods of time or walking excessively. 3. He is to use the postop offloading shoes as well. 4. With regard to the MRI for his right foot we are actually awaiting the scheduling of this currently. Hopefully that will be done shortly. We will see patient back for reevaluation in 1 week here in the clinic. If anything worsens or changes patient will contact our office for additional recommendations. Electronic Signature(s) Signed: 12/26/2019 5:29:37 PM By: Lenda KelpStone III, Maddoxx Burkitt PA-C Signed: 01/30/2020 9:05:01 AM By: Zandra AbtsLynch, Shatara RN, BSN Previous Signature: 12/19/2019 3:50:16 PM Version By: Lenda KelpStone III, Treshun Wold PA-C Entered By: Zandra AbtsLynch, Shatara on 12/26/2019  08:50:12 -------------------------------------------------------------------------------- SuperBill Details Patient Name: Date of Service: [name], [name] 12/19/2019 Medical Record [id number] Patient Account Number: [id number] Date of Birth/Sex: Treating RN: 04/27/1961 (59 y.o. [name] KollerM) Lynch, Shatara Primary Care Provider: Cheryll CockayneBurns, Stacy Other Clinician: Referring Provider: Treating Provider/Extender:Stone III, Dayton MartesHoyt Burns, Stacy Weeks in Treatment: 7 Diagnosis Coding ICD-10 Codes Code Description E11.622 Type 2 diabetes mellitus with other skin ulcer L97.522 Non-pressure chronic ulcer of other part of left foot with fat layer exposed L97.512 Non-pressure chronic ulcer of other part of right foot with fat layer exposed I10 Essential (primary) hypertension I50.42 Chronic combined systolic (congestive) and diastolic (congestive) heart failure I87.2 Venous insufficiency (chronic) (peripheral) L84 Corns and callosities Facility Procedures CPT4 Code Description: 9147829536100012 11042 - DEB SUBQ TISSUE 20 SQ CM/< ICD-10 Diagnosis Description L97.522 Non-pressure chronic ulcer of other part of left foot wi Modifier: th fat layer ex Quantity: 1 posed Physician Procedures CPT4 Code Description: 62130866770168 11042 - WC PHYS SUBQ TISS 20 SQ CM ICD-10 Diagnosis Description L97.522 Non-pressure chronic ulcer of other part of left foot wi Modifier: th fat layer exp Quantity: 1 osed Electronic Signature(s) Signed: 12/19/2019 3:51:18 PM By: Lenda KelpStone III, Fedrick Cefalu PA-C Entered By: Lenda KelpStone III, Sharlyn Odonnel on 12/19/2019 15:51:18

## 2019-12-19 NOTE — Progress Notes (Addendum)
[name], [name] ([id number]) Visit Report for 12/19/2019 Arrival Information Details Patient Name: Date of Service: [name], [name] 12/19/2019 1:00 PM Medical Record [id number] Patient Account Number: [id number] Date of Birth/Sex: Treating RN: [birth year] (59 y.o. [name] Primary Care Terease Marcotte: [name] Other Clinician: Referring Saachi Zale: Treating Jolita Haefner/Extender:Stone III, Dayton Martes, Stacy Weeks in Treatment: 7 Visit Information History Since Last Visit Added or deleted any No Patient Arrived: Cane medications: Arrival Time: 13:25 Any new allergies or adverse No Accompanied By: self reactions: Transfer Assistance: None Had a fall or experienced change No Patient Identification Verified: Yes in Secondary Verification Process Yes activities of daily living that may Completed: affect Patient Requires Transmission-Based No risk of falls: Precautions: Signs or symptoms of No Patient Has Alerts: Yes abuse/neglect since last visito Patient Alerts: L ABI 1.07 Hospitalized since last visit: No (04/2019) Implantable device outside of the No R ABI 1.14 clinic excluding (04/2019) cellular tissue based products placed in the center since last visit: Has Dressing in Place as Yes Prescribed: Has Footwear/Offloading in Place Yes as Prescribed: Left: Surgical Shoe with Pressure Relief Insole Right: Surgical Shoe with Pressure Relief Insole Pain Present Now: No Electronic Signature(s) Signed: 12/19/2019 5:43:51 PM By: Shawn Stall Entered By: Shawn Stall on 12/19/2019 13:37:58 -------------------------------------------------------------------------------- Encounter Discharge Information Details Patient Name: Date of Service: [name], [name] 12/19/2019 1:00 PM Medical Record [id number] Patient Account Number: [id number] Date of Birth/Sex: Treating RN: 10-03-1961 (58 y.o. Judie Petit) Yevonne Pax Primary Care Morganna Styles: [name] Other Clinician: Referring  Alizaya Oshea: Treating Dennisse Swader/Extender:Stone III, Dayton Martes, Stacy Weeks in Treatment: 7 Encounter Discharge Information Items Post Procedure Vitals Discharge Condition: Stable Temperature (F): 98.1 Ambulatory Status: Cane Pulse (bpm): 108 Discharge Destination: Home Respiratory Rate (breaths/min): 18 Transportation: Private Auto Blood Pressure (mmHg): 147/85 Accompanied By: self Schedule Follow-up Appointment: Yes Clinical Summary of Care: Patient Declined Electronic Signature(s) Signed: 12/19/2019 5:16:33 PM By: Yevonne Pax RN Entered By: Yevonne Pax on 12/19/2019 14:27:33 -------------------------------------------------------------------------------- Lower Extremity Assessment Details Patient Name: Date of Service: [name], [name] 12/19/2019 1:00 PM Medical Record [id number] Patient Account Number: [id number] Date of Birth/Sex: Treating RN: 1960-12-24 (59 y.o. [name] Primary Care Jigar Zielke: [name] Other Clinician: Referring Kitara Hebb: Treating Toben Acuna/Extender:Stone III, Dayton Martes, Stacy Weeks in Treatment: 7 Edema Assessment Assessed: [Left: Yes] [Right: Yes] Edema: [Left: Yes] [Right: Yes] Calf Left: Right: Point of Measurement: 30 cm From Medial Instep 37 cm 40 cm Ankle Left: Right: Point of Measurement: 11 cm From Medial Instep 27 cm 29 cm Electronic Signature(s) Signed: 12/19/2019 5:43:51 PM By: Shawn Stall Entered By: Shawn Stall on 12/19/2019 13:38:42 -------------------------------------------------------------------------------- Multi-Disciplinary Care Plan Details Patient Name: Date of Service: [name], [name] 12/19/2019 1:00 PM Medical Record [id number] Patient Account Number: [id number] Date of Birth/Sex: Treating RN: 1961-07-01 (59 y.o. [name] Primary Care Emmitte Surgeon: [name] Other Clinician: Referring Mykal Batiz: Treating Khristina Janota/Extender:Stone III, Dayton Martes, Stacy Weeks in Treatment: 7 Active  Inactive Nutrition Nursing Diagnoses: Potential for alteratiion in Nutrition/Potential for imbalanced nutrition Goals: Patient/caregiver will maintain therapeutic glucose control Date Initiated: 10/31/2019 Target Resolution Date: 12/26/2019 Goal Status: Active Interventions: Assess HgA1c results as ordered upon admission and as needed Provide education on elevated blood sugars and impact on wound healing Treatment Activities: Patient referred to Primary Care Physician for further nutritional evaluation : 10/31/2019 Notes: Wound/Skin Impairment Nursing Diagnoses: Impaired tissue integrity Knowledge deficit related to ulceration/compromised skin integrity Goals: Patient/caregiver will verbalize understanding of skin care regimen Date Initiated: 10/31/2019 Target Resolution Date: 12/26/2019 Goal Status: Active Ulcer/skin breakdown will have a volume  reduction of 30% by week 4 Date Initiated: 10/31/2019 Date Inactivated: 11/28/2019 Target Resolution Date: 11/28/2019 Unmet Reason: left Goal Status: Unmet improving, right unable to offload Ulcer/skin breakdown will have a volume reduction of 50% by week 8 Date Initiated: 11/28/2019 Target Resolution Date: 12/26/2019 Goal Status: Active Interventions: Assess patient/caregiver ability to obtain necessary supplies Assess patient/caregiver ability to perform ulcer/skin care regimen upon admission and as needed Assess ulceration(s) every visit Provide education on ulcer and skin care Treatment Activities: Skin care regimen initiated : 10/31/2019 Topical wound management initiated : 10/31/2019 Notes: Electronic Signature(s) Signed: 12/19/2019 5:54:02 PM By: [name] RN, BSN Entered By: [name] on 12/19/2019 13:47:12 -------------------------------------------------------------------------------- Pain Assessment Details Patient Name: Date of Service: [name], [name] 12/19/2019 1:00 PM Medical Record [id number] Patient Account  Number: [id number] Date of Birth/Sex: Treating RN: Dec 08, 1960 (59 y.o. [name] Primary Care Dorlisa Savino: Billey Gosling Other Clinician: Referring Latika Kronick: Treating Shanon Becvar/Extender:Stone III, Elby Showers, Stacy Weeks in Treatment: 7 Active Problems Location of Pain Severity and Description of Pain Patient Has Paino No Site Locations Rate the pain. Current Pain Level: 0 Pain Management and Medication Current Pain Management: Medication: No Cold Application: No Rest: No Massage: No Activity: No T.E.N.S.: No Heat Application: No Leg drop or elevation: No Is the Current Pain Management Adequate: Adequate How does your wound impact your activities of daily livingo Sleep: No Bathing: No Appetite: No Relationship With Others: No Bladder Continence: No Emotions: No Bowel Continence: No Work: No Toileting: No Drive: No Dressing: No Hobbies: No Electronic Signature(s) Signed: 12/19/2019 5:43:51 PM By: Deon Pilling Entered By: Deon Pilling on 12/19/2019 13:38:25 -------------------------------------------------------------------------------- Patient/Caregiver Education Details Patient Name: Date of Service: [name], [name] 3/3/2021andnbsp1:00 PM Medical Record [id number] Patient Account Number: [id number] Date of Birth/Gender: [birth year] (58 y.o. M) Treating RN: [name] Primary Care Physician: Billey Gosling Other Clinician: Referring Physician: Treating Physician/Extender:Stone III, Elby Showers, Nadine Counts in Treatment: 7 Education Assessment Education Provided To: Patient Education Topics Provided Wound/Skin Impairment: Methods: Explain/Verbal Responses: State content correctly Electronic Signature(s) Signed: 12/19/2019 5:54:02 PM By: [name] RN, BSN Entered By: [name] on 12/19/2019 13:47:24 -------------------------------------------------------------------------------- Wound Assessment Details Patient Name: Date of Service: [name], [name] 12/19/2019 1:00 PM Medical Record [id number] Patient Account Number: [id number] Date of Birth/Sex: Treating RN: [birth year] (59 y.o. [name] Primary Care Seymore Brodowski: Billey Gosling Other Clinician: Referring Tracen [name]: Treating Nyanna Heideman/Extender:Stone III, Elby Showers, Stacy Weeks in Treatment: 7 Wound Status Wound Number: 1 Primary Diabetic Wound/Ulcer of the Lower Extremity Etiology: Wound Location: Left Foot - Plantar Wound Open Wounding Event: Gradually Appeared Status: Date Acquired: 02/16/2019 Comorbid Anemia, Sleep Apnea, Congestive Heart Weeks Of Treatment: 7 History: Failure, Hypertension, Peripheral Venous Clustered Wound: No Disease, Cirrhosis , Type II Diabetes, Osteoarthritis, Neuropathy Photos Wound Measurements Length: (cm) 2.2 Width: (cm) 2.5 Depth: (cm) 0.1 Area: (cm) 4.32 Volume: (cm) 0.432 Wound Description Classification: Grade 2 Wound Margin: Flat and Intact Exudate Amount: Medium Exudate Type: Serosanguineous Exudate Color: red, brown Wound Bed Granulation Amount: Large (67-100%) Granulation Quality: Pink Necrotic Amount: None Present (0%) After Cleansing: No brino No Exposed Structure osed: No (Subcutaneous Tissue) Exposed: Yes osed: No osed: No sed: No ed: No % Reduction in Area: 51.8% % Reduction in Volume: 93.1% Epithelialization: Small (1-33%) Tunneling: No Undermining: No Foul Odor Slough/Fi Fascia Exp Fat Layer Tendon Exp Muscle Exp Joint Expo Bone Expos Treatment Notes Wound #1 (Left, Plantar Foot) 1. Cleanse With Wound Cleanser Soap and water 3. Primary Dressing Applied Hydrofera Blue 4. Secondary Dressing ABD  Pad Dry Gauze Roll Gauze 5. Secured With Secretary/administrator) Signed: 12/21/2019 4:01:29 PM By: Benjaman Kindler EMT/HBOT Signed: 12/24/2019 5:59:01 PM By: Zandra Abts RN, BSN Previous Signature: 12/19/2019 5:43:51 PM Version By: Shawn Stall Previous Signature: 12/19/2019 5:43:51 PM  Version By: Shawn Stall Entered By: Benjaman Kindler on 12/21/2019 11:28:40 -------------------------------------------------------------------------------- Wound Assessment Details Patient Name: Date of Service: [name], [name] 12/19/2019 1:00 PM Medical Record [id number] Patient Account Number: [id number] Date of Birth/Sex: Treating RN: 1961-03-08 (59 y.o. [name] Primary Care Kinnedy Mongiello: [name] Other Clinician: Referring Cornelious Bartolucci: Treating Letti Towell/Extender:Stone III, Dayton Martes, Stacy Weeks in Treatment: 7 Wound Status Wound Number: 2 Primary Diabetic Wound/Ulcer of the Lower Extremity Etiology: Wound Location: Right Metatarsal head second Wound Open Wounding Event: Gradually Appeared Status: Date Acquired: 02/16/2019 Comorbid Anemia, Sleep Apnea, Congestive Heart Weeks Of Treatment: 7 History: Failure, Hypertension, Peripheral Venous Clustered Wound: No Disease, Cirrhosis , Type II Diabetes, Osteoarthritis, Neuropathy Photos Wound Measurements Length: (cm) 2.6 % Reductio Width: (cm) 2 % Reductio Depth: (cm) 1.2 Epithelial Area: (cm) 4.084 Tunneling Volume: (cm) 4.901 Undermini Wound Description Classification: Grade 2 Wound Margin: Flat and Intact Exudate Amount: Medium Exudate Type: Serosanguineous Exudate Color: red, brown Wound Bed Granulation Amount: Large (67-100%) Granulation Quality: Pink Necrotic Amount: None Present (0%) Foul Odor After Cleansing: No Slough/Fibrino No Exposed Structure Fascia Exposed: No Fat Layer (Subcutaneous Tissue) Exposed: Yes Tendon Exposed: No Muscle Exposed: No Joint Exposed: No Bone Exposed: No n in Area: -85.7% n in Volume: -85.7% ization: Small (1-33%) : No ng: No Treatment Notes Wound #2 (Right Metatarsal head second) 1. Cleanse With Wound Cleanser Soap and water 3. Primary Dressing Applied Hydrofera Blue 4. Secondary Dressing ABD Pad Dry Gauze Roll Gauze 5. Secured With Music therapist) Signed: 12/21/2019 4:01:29 PM By: Benjaman Kindler EMT/HBOT Signed: 12/24/2019 5:59:01 PM By: Zandra Abts RN, BSN Previous Signature: 12/19/2019 5:43:51 PM Version By: Shawn Stall Entered By: Benjaman Kindler on 12/21/2019 11:21:40 -------------------------------------------------------------------------------- Vitals Details Patient Name: Date of Service: [name], [name] 12/19/2019 1:00 PM Medical Record [id number] Patient Account Number: [id number] Date of Birth/Sex: Treating RN: 06/05/1961 (59 y.o. [name] Primary Care Jaydn [name]: [name] Other Clinician: Referring Jizelle Conkey: Treating Beckam Abdulaziz/Extender:Stone III, Dayton Martes, Stacy Weeks in Treatment: 7 Vital Signs Time Taken: 13:30 Temperature (F): 98.1 Height (in): 73 Pulse (bpm): 108 Weight (lbs): 260 Respiratory Rate (breaths/min): 18 Body Mass Index (BMI): 34.3 Blood Pressure (mmHg): 147/85 Reference Range: 80 - 120 mg / dl Electronic Signature(s) Signed: 12/19/2019 5:43:51 PM By: Shawn Stall Entered By: Shawn Stall on 12/19/2019 13:38:17

## 2019-12-26 ENCOUNTER — Other Ambulatory Visit: Payer: Self-pay

## 2019-12-26 ENCOUNTER — Encounter (HOSPITAL_BASED_OUTPATIENT_CLINIC_OR_DEPARTMENT_OTHER): Payer: Medicare (Managed Care) | Admitting: Physician Assistant

## 2019-12-26 DIAGNOSIS — E11621 Type 2 diabetes mellitus with foot ulcer: Secondary | ICD-10-CM | POA: Diagnosis not present

## 2019-12-26 NOTE — Progress Notes (Addendum)
GERREN, HOFFMEIER ([id number]) Visit Report for 12/26/2019 Chief Complaint Document Details Patient Name: Date of Service: [name], [name] 12/26/2019 1:00 PM Medical Record [id number] Patient Account Number: [id number] Date of Birth/Sex: Treating RN: 12-31-1960 (59 y.o. [name] Primary Care Provider: Billey Gosling Other Clinician: Referring Provider: Treating Provider/Extender:Stone III, Elby Showers, Nadine Counts in Treatment: 8 Information Obtained from: Patient Chief Complaint Bilateral foot diabetic ulcers Electronic Signature(s) Signed: 12/26/2019 1:59:54 PM By: Worthy Keeler PA-C Entered By: Worthy Keeler on 12/26/2019 13:59:54 -------------------------------------------------------------------------------- Debridement Details Patient Name: Date of Service: [name], [name] 12/26/2019 1:00 PM Medical Record [id number] Patient Account Number: [id number] Date of Birth/Sex: Treating RN: 1961/09/10 (59 y.o. [name] Primary Care Provider: Billey Gosling Other Clinician: Referring Provider: Treating Provider/Extender:Stone III, Elby Showers, Stacy Weeks in Treatment: 8 Debridement Performed for Wound #2 Right Metatarsal head second Assessment: Performed By: Physician Worthy Keeler, PA Debridement Type: Debridement Severity of Tissue Pre Fat layer exposed Debridement: Level of Consciousness (Pre- Awake and Alert procedure): Pre-procedure Verification/Time Out Taken: Yes - 13:50 Start Time: 13:52 Total Area Debrided (L x W): 2.8 (cm) x 2 (cm) = 5.6 (cm) Tissue and other material Non-Viable, Callus, Skin: Epidermis debrided: Level: Skin/Epidermis Debridement Description: Selective/Open Wound Instrument: Curette Bleeding: None End Time: 13:54 Procedural Pain: 0 Post Procedural Pain: 0 Response to Treatment: Procedure was tolerated well Level of Consciousness Awake and Alert (Post-procedure): Post Debridement Measurements of Total Wound Length:  (cm) 2.8 Width: (cm) 2 Depth: (cm) 1.6 Volume: (cm) 7.037 Character of Wound/Ulcer Post Improved Debridement: Severity of Tissue Post Debridement: Fat layer exposed Post Procedure Diagnosis Same as Pre-procedure Electronic Signature(s) Signed: 12/26/2019 5:29:37 PM By: Worthy Keeler PA-C Signed: 12/26/2019 5:43:44 PM By: Baruch Gouty RN, BSN Entered By: Baruch Gouty on 12/26/2019 13:54:23 -------------------------------------------------------------------------------- HPI Details Patient Name: Date of Service: [name], [name] 12/26/2019 1:00 PM Medical Record [id number] Patient Account Number: [id number] Date of Birth/Sex: Treating RN: 01-02-1961 (59 y.o. [name] Primary Care Provider: Billey Gosling Other Clinician: Referring Provider: Treating Provider/Extender:Stone III, Elby Showers, Stacy Weeks in Treatment: 8 History of Present Illness HPI Description: 10/31/2019 upon evaluation today patient appears to be doing somewhat poorly in regard to his bilateral plantar feet. He has wounds that he tells me have been present since 2012 intermittently off and on. Most recently this has been open for at least the past 6 months to a year. He has been trying to treat this in different ways using Santyl along with various other dressings including Medihoney and even at one point Xeroform. Nothing really has seem to get this completely closed. He was recently in the hospital for cellulitis of his leg subsequently he did have x-rays as well as MRIs that showed negative for any signs of osteomyelitis in regard to the wounds on his feet. Fortunately there is no signs of systemic infection at this time. No fevers, chills, nausea, vomiting, or diarrhea. Patient has previously used Darco offloading shoes as far as frontal floaters as well as postop surgical shoes. He has never been in a total contact cast that may be something we need to strongly consider here. Patient's most  recent hemoglobin A1c 1 month ago was 5.3 seems to be very well controlled which is great. Subsequently he has seen vascular as well as podiatry. His ABIs are 1.07 on the left and 1.14 on the right he seems to be doing well he does have chronic venous stasis. 11/07/2019 upon evaluation today patient appears to be doing well  with regard to his wounds all things considered. I do not see any severe worsening he still has some callus buildup on the right more than the left he notes that he has been probably more active than he should as far as walking is concerned is just very hard to not be active. He knows he needs to be more careful in this regard however. He is willing to give the cast a try at this point although he notes that he is a little nervous about this just with regard to balance although he will be very careful and obviously if he has any trouble he knows to contact the office and let me know. 1/22; patient is in for his obligatory first total contact cast change. Our intake nurse reported a very large amount of drainage which is spelled out over to the surrounding skin. Has bilateral diabetic foot wounds. He has Charcot feet. We have been using silver alginate on his wounds. 11/14/2019 on evaluation today patient is actually seeming to make good improvement in regard to his bilateral plantar foot wounds. We have been using a cast on the left side and on the right side he has been using dressings he is changing up his own accord. With that being said he tells me that he is also not walking as much just due to how unsteady he feels. He takes it easy when he does have to walk and when he does not have to walk he is resting. This is probably help in his right foot as well has the left foot which is actually measuring better. In fact both are measuring better. Overall I am very pleased with how things seem to be progressing. The patient does have some odor on the left foot this does have me  concerned about the possibility of infection, and actually probably go ahead and put him on antibiotics today as well as utilizing a continuation of the cast on the left foot I think that will be fine we probably just need to bring him in sooner to change this not last a whole week. 1/29; we brought the patient back today for a total contact cast change on the left out of concern for excessive drainage. We are using drawtex over the wound as the primary dressing 11/21/2019 on evaluation today patient appears to be doing well with regard to his left plantar foot. In fact both foot ulcers actually seem to be doing pretty well. Nonetheless he is having a lot of drainage on the left at this time and again we did obtain a wound culture did show positive for Staph aureus that was reviewed by myself today as well. Nonetheless he is on Bactrim which was shown to be sensitive that should be helping in this regard. Fortunately there is no signs of infection systemically at this point. 2/5; back in clinic today for a total contact cast change apparently secondary to very significant drainage. Still using drawtex 11/28/2019 upon evaluation today patient appears to be doing well with regard to his wounds. The right foot is doing okay as measured about the same in my opinion. The left foot is actually showing signs of significant improvement is measuring smaller there is a lot of hyper granulation likely due to the continued drainage at this point. We did obtain approval for a snap VAC I think that is good to be appropriate for him and will likely help this tremendously underneath the cast. He is definitely in agreement with proceeding  with such. 2/12; patient came in today after his snap VAC lost suction. Brought in to see one of our nurses. The dressing was replaced and then we put the cast back on and rehooked up the snap VAC. Apparently his wound looked very good per our intake nurse. 2/15; again we replaced  the cast on Friday. By Saturday the snap VAC and light suction. He called this morning he comes in acutely. The wounds look fine however the VAC is not functioning. We replaced the cast using silver alginate as the primary dressing backed with Kerramax. The snap VAC was not replaced 12/05/2019 upon evaluation today patient appears to be doing better in regard to his left plantar foot ulcer. Fortunately there is no signs of active infection at this time. Unfortunately he is continuing to have issues with the right foot he is really not making any progress here things seem to be somewhat stagnant to be honest. The depth has increased but that is due to me having debrided the wound in the past based on what I am seeing. 12/12/2019 on evaluation today patient appears to be doing more poorly in regard to the left lower extremity. He has some erythema spreading up the side of his foot I am concerned about infection again at this point. Unfortunately he has been seeing improvement with a total contact cast but I do not think we should put that on today. On his right plantar foot he continues to have significant drainage this is actually measuring deeper I really do not feel like you are making any progress whatsoever. I have prescribed Granix for him unfortunately his insurance apparently was going to cost him a $500 co-pay. 12/19/2019 upon evaluation today patient actually appears to be doing better in regard to both wounds. With that being said he actually did get the reGranix which he had to pay $500 for. With that being said it does look like that he is actually made some improvement based on what I am seeing at this point with the reGranix. Obviously if he is going to continue this we are going to do something about trying to get him some help in covering the cost. 12/26/2019 on evaluation today patient appears to be doing really much better even compared to last week. Overall the wound seems to be much  better even compared to last week and last week was better than the week before. Since has been using the reGranix his symptoms have improved significantly. With that being said the issue right now is simply that this is a very expensive medication for him the first dose cost him $500. Upon inspection patient's wound bed actually is however dramatically improved compared to before he started this 2 weeks ago. Electronic Signature(s) Signed: 12/26/2019 2:00:34 PM By: Lenda Kelp PA-C Entered By: Lenda Kelp on 12/26/2019 14:00:34 -------------------------------------------------------------------------------- Physical Exam Details Patient Name: Date of Service: [name], [name] 12/26/2019 1:00 PM Medical Record [id number] Patient Account Number: [id number] Date of Birth/Sex: Treating RN: 01-May-1961 (59 y.o. [name] Primary Care Provider: [name] Other Clinician: Referring Provider: Treating Provider/Extender:Stone III, Dayton Martes, Stacy Weeks in Treatment: 8 Constitutional Well-nourished and well-hydrated in no acute distress. Respiratory normal breathing without difficulty. Psychiatric this patient is able to make decisions and demonstrates good insight into disease process. Alert and Oriented x 3. pleasant and cooperative. Notes Upon inspection today patient's wound bed actually did not require any sharp debridement in the central portion of either wound. There was some  callus buildup around the edges of the right plantar wound although I was able to debride away the callus today no other further debridement was necessary the wound seems to be doing quite well to be perfectly honest. Electronic Signature(s) Signed: 12/26/2019 2:01:24 PM By: Lenda Kelp PA-C Entered By: Lenda Kelp on 12/26/2019 14:01:24 -------------------------------------------------------------------------------- Physician Orders Details Patient Name: Date of Service: [name], [name] 12/26/2019 1:00 PM Medical Record [id number] Patient Account Number: [id number] Date of Birth/Sex: Treating RN: July 14, 1961 (59 y.o. [name] Primary Care Provider: [name] Other Clinician: Referring Provider: Treating Provider/Extender:Stone III, Dayton Martes, Stacy Weeks in Treatment: 8 Verbal / Phone Orders: No Diagnosis Coding ICD-10 Coding Code Description E11.622 Type 2 diabetes mellitus with other skin ulcer L97.522 Non-pressure chronic ulcer of other part of left foot with fat layer exposed L97.512 Non-pressure chronic ulcer of other part of right foot with fat layer exposed I10 Essential (primary) hypertension I50.42 Chronic combined systolic (congestive) and diastolic (congestive) heart failure I87.2 Venous insufficiency (chronic) (peripheral) L84 Corns and callosities Follow-up Appointments Return Appointment in 1 week. Dressing Change Frequency Wound #1 Left,Plantar Foot Change dressing every day. Wound #2 Right Metatarsal head second Change dressing every day. Wound Cleansing Wound #1 Left,Plantar Foot May shower and wash wound with soap and water. Wound #2 Right Metatarsal head second May shower and wash wound with soap and water. Primary Wound Dressing Wound #1 Left,Plantar Foot Hydrofera Blue Other: - regranex apply in the evening and remove in the morning Wound #2 Right Metatarsal head second Hydrofera Blue Other: - regranex apply in the evening and remove in the morning Secondary Dressing Wound #1 Left,Plantar Foot Kerlix/Rolled Gauze Dry Gauze ABD pad Wound #2 Right Metatarsal head second Kerlix/Rolled Gauze Dry Gauze Other: - felt callous pad Edema Control Patient to wear own compression stockings - daily to right leg Avoid standing for long periods of time Elevate legs to the level of the heart or above for 30 minutes daily and/or when sitting, a frequency of: Off-Loading Open toe surgical shoe to: - both  feet Electronic Signature(s) Signed: 12/26/2019 5:29:37 PM By: Lenda Kelp PA-C Signed: 12/26/2019 5:43:44 PM By: Zenaida Deed RN, BSN Entered By: Zenaida Deed on 12/26/2019 13:56:16 -------------------------------------------------------------------------------- Problem List Details Patient Name: Date of Service: [name], [name] 12/26/2019 1:00 PM Medical Record [id number] Patient Account Number: [id number] Date of Birth/Sex: Treating RN: 02-05-1961 (59 y.o. [name] Primary Care Provider: [name] Other Clinician: Referring Provider: Treating Provider/Extender:Stone III, Dayton Martes, Stacy Weeks in Treatment: 8 Active Problems ICD-10 Evaluated Encounter Code Description Active Date Today Diagnosis E11.622 Type 2 diabetes mellitus with other skin ulcer 10/31/2019 No Yes L97.522 Non-pressure chronic ulcer of other part of left foot 10/31/2019 No Yes with fat layer exposed L97.512 Non-pressure chronic ulcer of other part of right foot 10/31/2019 No Yes with fat layer exposed I10 Essential (primary) hypertension 10/31/2019 No Yes I50.42 Chronic combined systolic (congestive) and diastolic 10/31/2019 No Yes (congestive) heart failure I87.2 Venous insufficiency (chronic) (peripheral) 10/31/2019 No Yes L84 Corns and callosities 11/14/2019 No Yes Inactive Problems Resolved Problems Electronic Signature(s) Signed: 12/26/2019 1:29:59 PM By: Lenda Kelp PA-C Entered By: Lenda Kelp on 12/26/2019 13:29:58 -------------------------------------------------------------------------------- Progress Note Details Patient Name: Date of Service: [name], [name] 12/26/2019 1:00 PM Medical Record [id number] Patient Account Number: [id number] Date of Birth/Sex: Treating RN: 1961-08-02 (59 y.o. [name] Primary Care Provider: [name] Other Clinician: Referring Provider: Treating Provider/Extender:Stone III, Dayton Martes, Stacy Weeks in Treatment:  8 Subjective Chief Complaint Information obtained from Patient Bilateral foot diabetic ulcers History of Present Illness (HPI) 10/31/2019 upon evaluation today patient appears to be doing somewhat poorly in regard to his bilateral plantar feet. He has wounds that he tells me have been present since 2012 intermittently off and on. Most recently this has been open for at least the past 6 months to a year. He has been trying to treat this in different ways using Santyl along with various other dressings including Medihoney and even at one point Xeroform. Nothing really has seem to get this completely closed. He was recently in the hospital for cellulitis of his leg subsequently he did have x-rays as well as MRIs that showed negative for any signs of osteomyelitis in regard to the wounds on his feet. Fortunately there is no signs of systemic infection at this time. No fevers, chills, nausea, vomiting, or diarrhea. Patient has previously used Darco offloading shoes as far as frontal floaters as well as postop surgical shoes. He has never been in a total contact cast that may be something we need to strongly consider here. Patient's most recent hemoglobin A1c 1 month ago was 5.3 seems to be very well controlled which is great. Subsequently he has seen vascular as well as podiatry. His ABIs are 1.07 on the left and 1.14 on the right he seems to be doing well he does have chronic venous stasis. 11/07/2019 upon evaluation today patient appears to be doing well with regard to his wounds all things considered. I do not see any severe worsening he still has some callus buildup on the right more than the left he notes that he has been probably more active than he should as far as walking is concerned is just very hard to not be active. He knows he needs to be more careful in this regard however. He is willing to give the cast a try at this point although he notes that he is a little nervous about this just  with regard to balance although he will be very careful and obviously if he has any trouble he knows to contact the office and let me know. 1/22; patient is in for his obligatory first total contact cast change. Our intake nurse reported a very large amount of drainage which is spelled out over to the surrounding skin. Has bilateral diabetic foot wounds. He has Charcot feet. We have been using silver alginate on his wounds. 11/14/2019 on evaluation today patient is actually seeming to make good improvement in regard to his bilateral plantar foot wounds. We have been using a cast on the left side and on the right side he has been using dressings he is changing up his own accord. With that being said he tells me that he is also not walking as much just due to how unsteady he feels. He takes it easy when he does have to walk and when he does not have to walk he is resting. This is probably help in his right foot as well has the left foot which is actually measuring better. In fact both are measuring better. Overall I am very pleased with how things seem to be progressing. The patient does have some odor on the left foot this does have me concerned about the possibility of infection, and actually probably go ahead and put him on antibiotics today as well as utilizing a continuation of the cast on the left foot I think that will be fine we probably  just need to bring him in sooner to change this not last a whole week. 1/29; we brought the patient back today for a total contact cast change on the left out of concern for excessive drainage. We are using drawtex over the wound as the primary dressing 11/21/2019 on evaluation today patient appears to be doing well with regard to his left plantar foot. In fact both foot ulcers actually seem to be doing pretty well. Nonetheless he is having a lot of drainage on the left at this time and again we did obtain a wound culture did show positive for Staph aureus that  was reviewed by myself today as well. Nonetheless he is on Bactrim which was shown to be sensitive that should be helping in this regard. Fortunately there is no signs of infection systemically at this point. 2/5; back in clinic today for a total contact cast change apparently secondary to very significant drainage. Still using drawtex 11/28/2019 upon evaluation today patient appears to be doing well with regard to his wounds. The right foot is doing okay as measured about the same in my opinion. The left foot is actually showing signs of significant improvement is measuring smaller there is a lot of hyper granulation likely due to the continued drainage at this point. We did obtain approval for a snap VAC I think that is good to be appropriate for him and will likely help this tremendously underneath the cast. He is definitely in agreement with proceeding with such. 2/12; patient came in today after his snap VAC lost suction. Brought in to see one of our nurses. The dressing was replaced and then we put the cast back on and rehooked up the snap VAC. Apparently his wound looked very good per our intake nurse. 2/15; again we replaced the cast on Friday. By Saturday the snap VAC and light suction. He called this morning he comes in acutely. The wounds look fine however the VAC is not functioning. We replaced the cast using silver alginate as the primary dressing backed with Kerramax. The snap VAC was not replaced 12/05/2019 upon evaluation today patient appears to be doing better in regard to his left plantar foot ulcer. Fortunately there is no signs of active infection at this time. Unfortunately he is continuing to have issues with the right foot he is really not making any progress here things seem to be somewhat stagnant to be honest. The depth has increased but that is due to me having debrided the wound in the past based on what I am seeing. 12/12/2019 on evaluation today patient appears to be  doing more poorly in regard to the left lower extremity. He has some erythema spreading up the side of his foot I am concerned about infection again at this point. Unfortunately he has been seeing improvement with a total contact cast but I do not think we should put that on today. On his right plantar foot he continues to have significant drainage this is actually measuring deeper I really do not feel like you are making any progress whatsoever. I have prescribed Granix for him unfortunately his insurance apparently was going to cost him a $500 co-pay. 12/19/2019 upon evaluation today patient actually appears to be doing better in regard to both wounds. With that being said he actually did get the reGranix which he had to pay $500 for. With that being said it does look like that he is actually made some improvement based on what I am seeing at  this point with the reGranix. Obviously if he is going to continue this we are going to do something about trying to get him some help in covering the cost. 12/26/2019 on evaluation today patient appears to be doing really much better even compared to last week. Overall the wound seems to be much better even compared to last week and last week was better than the week before. Since has been using the reGranix his symptoms have improved significantly. With that being said the issue right now is simply that this is a very expensive medication for him the first dose cost him $500. Upon inspection patient's wound bed actually is however dramatically improved compared to before he started this 2 weeks ago. Objective Constitutional Well-nourished and well-hydrated in no acute distress. Vitals Time Taken: 1:05 PM, Height: 73 in, Weight: 260 lbs, BMI: 34.3, Temperature: 98.1 F, Pulse: 103 bpm, Respiratory Rate: 18 breaths/min, Blood Pressure: 159/86 mmHg. Respiratory normal breathing without difficulty. Psychiatric this patient is able to make decisions and  demonstrates good insight into disease process. Alert and Oriented x 3. pleasant and cooperative. General Notes: Upon inspection today patient's wound bed actually did not require any sharp debridement in the central portion of either wound. There was some callus buildup around the edges of the right plantar wound although I was able to debride away the callus today no other further debridement was necessary the wound seems to be doing quite well to be perfectly honest. Integumentary (Hair, Skin) Wound #1 status is Open. Original cause of wound was Gradually Appeared. The wound is located on the Left,Plantar Foot. The wound measures 2cm length x 2.1cm width x 0.1cm depth; 3.299cm^2 area and 0.33cm^3 volume. There is Fat Layer (Subcutaneous Tissue) Exposed exposed. There is no tunneling or undermining noted. There is a medium amount of serosanguineous drainage noted. The wound margin is flat and intact. There is large (67-100%) red, pink granulation within the wound bed. There is no necrotic tissue within the wound bed. Wound #2 status is Open. Original cause of wound was Gradually Appeared. The wound is located on the Right Metatarsal head second. The wound measures 2.6cm length x 2cm width x 1.6cm depth; 4.084cm^2 area and 6.535cm^3 volume. There is Fat Layer (Subcutaneous Tissue) Exposed exposed. There is no tunneling noted, however, there is undermining starting at 3:00 and ending at 6:00 with a maximum distance of 0.2cm. There is a medium amount of serosanguineous drainage noted. The wound margin is flat and intact. There is large (67-100%) pink granulation within the wound bed. There is no necrotic tissue within the wound bed. Assessment Active Problems ICD-10 Type 2 diabetes mellitus with other skin ulcer Non-pressure chronic ulcer of other part of left foot with fat layer exposed Non-pressure chronic ulcer of other part of right foot with fat layer exposed Essential (primary)  hypertension Chronic combined systolic (congestive) and diastolic (congestive) heart failure Venous insufficiency (chronic) (peripheral) Corns and callosities Procedures Wound #2 Pre-procedure diagnosis of Wound #2 is a Diabetic Wound/Ulcer of the Lower Extremity located on the Right Metatarsal head second .Severity of Tissue Pre Debridement is: Fat layer exposed. There was a Selective/Open Wound Skin/Epidermis Debridement with a total area of 5.6 sq cm performed by Lenda KelpStone III, Welda Azzarello, PA. With the following instrument(s): Curette to remove Non-Viable tissue/material. Material removed includes Callus and Skin: Epidermis and. No specimens were taken. A time out was conducted at 13:50, prior to the start of the procedure. There was no bleeding. The procedure was tolerated well  with a pain level of 0 throughout and a pain level of 0 following the procedure. Post Debridement Measurements: 2.8cm length x 2cm width x 1.6cm depth; 7.037cm^3 volume. Character of Wound/Ulcer Post Debridement is improved. Severity of Tissue Post Debridement is: Fat layer exposed. Post procedure Diagnosis Wound #2: Same as Pre-Procedure Plan Follow-up Appointments: Return Appointment in 1 week. Dressing Change Frequency: Wound #1 Left,Plantar Foot: Change dressing every day. Wound #2 Right Metatarsal head second: Change dressing every day. Wound Cleansing: Wound #1 Left,Plantar Foot: May shower and wash wound with soap and water. Wound #2 Right Metatarsal head second: May shower and wash wound with soap and water. Primary Wound Dressing: Wound #1 Left,Plantar Foot: Hydrofera Blue Other: - regranex apply in the evening and remove in the morning Wound #2 Right Metatarsal head second: Hydrofera Blue Other: - regranex apply in the evening and remove in the morning Secondary Dressing: Wound #1 Left,Plantar Foot: Kerlix/Rolled Gauze Dry Gauze ABD pad Wound #2 Right Metatarsal head second: Kerlix/Rolled  Gauze Dry Gauze Other: - felt callous pad Edema Control: Patient to wear own compression stockings - daily to right leg Avoid standing for long periods of time Elevate legs to the level of the heart or above for 30 minutes daily and/or when sitting, a frequency of: Off-Loading: Open toe surgical shoe to: - both feet 1. I would recommend at this time if at all possible patient continue with the reGranix. With that being said I do think that the sooner we can get this approved for formulary coverage the better and again I do believe this is something that is somewhat replaceable as far as the functioning benefit to the patient. They are also in the long- term same the insurance company monitoring. 2. I am recommending continue with appropriate offloading he is doing everything he needs to do in that regard in my opinion. 3. I am also can recommend the patient continue to monitor for any signs of infection the right now honestly he is doing much better. We will see patient back for reevaluation in 1 week here in the clinic. If anything worsens or changes patient will contact our office for additional recommendations. Electronic Signature(s) Signed: 12/26/2019 2:01:52 PM By: Lenda Kelp PA-C Entered By: Lenda Kelp on 12/26/2019 14:01:52 -------------------------------------------------------------------------------- SuperBill Details Patient Name: Date of Service: [name], [name] 12/26/2019 Medical Record [id number] Patient Account Number: [id number] Date of Birth/Sex: Treating RN: 1961/10/18 (59 y.o. [name] Primary Care Provider: [name] Other Clinician: Referring Provider: Treating Provider/Extender:Stone III, Dayton Martes, Stacy Weeks in Treatment: 8 Diagnosis Coding ICD-10 Codes Code Description E11.622 Type 2 diabetes mellitus with other skin ulcer L97.522 Non-pressure chronic ulcer of other part of left foot with fat layer exposed L97.512  Non-pressure chronic ulcer of other part of right foot with fat layer exposed I10 Essential (primary) hypertension I50.42 Chronic combined systolic (congestive) and diastolic (congestive) heart failure I87.2 Venous insufficiency (chronic) (peripheral) L84 Corns and callosities Facility Procedures CPT4 Code Description: [id number] - DEBRIDE WOUND 1ST 20 SQ CM OR < ICD-10 Diagnosis Description L97.512 Non-pressure chronic ulcer of other part of right foot with Modifier: fat layer ex Quantity: 1 posed Physician Procedures CPT4 Code Description: [id number] 97597 - WC PHYS DEBR WO ANESTH 20 SQ CM ICD-10 Diagnosis Description L97.512 Non-pressure chronic ulcer of other part of right foot with Modifier: fat layer ex Quantity: 1 posed Electronic Signature(s) Signed: 12/26/2019 2:01:59 PM By: Lenda Kelp PA-C Entered By: Lenda Kelp on 12/26/2019 14:01:58

## 2019-12-27 ENCOUNTER — Telehealth: Payer: Self-pay | Admitting: Internal Medicine

## 2019-12-27 NOTE — Telephone Encounter (Signed)
    Patient calling to request approval for have ortho procedure at [facility], Dr Linna Caprice. Patient states his insurance company asked him to have Dr Lawerance Bach give approval. The patient states he is only have testing done (xray or scans)   Please call

## 2019-12-28 NOTE — Telephone Encounter (Signed)
Called pt and asked for form to be sent over for Dr. Lawerance Bach to look over for approval of MRI.

## 2019-12-29 ENCOUNTER — Other Ambulatory Visit: Payer: Self-pay | Admitting: Internal Medicine

## 2019-12-31 NOTE — Progress Notes (Signed)
[name], [name] ([id number]) Visit Report for 12/26/2019 Arrival Information Details Patient Name: Date of Service: [name], [name] 12/26/2019 1:00 PM Medical Record [id number] Patient Account Number: [id number] Date of Birth/Sex: Treating RN: 02-26-1961 (59 y.o. [name] Primary Care Shaqueena Mauceri: Billey Gosling Other Clinician: Referring Meggan Dhaliwal: Treating Elder Davidian/Extender:Stone III, Elby Showers, Stacy Weeks in Treatment: 8 Visit Information History Since Last Visit Added or deleted any medications: No Patient Arrived: [name] Any new allergies or adverse reactions: No Arrival Time: 13:03 Had a fall or experienced change in No Accompanied By: alone activities of daily living that may affect Transfer Assistance: None risk of falls: Patient Identification Verified: Yes Signs or symptoms of abuse/neglect since last No Secondary Verification Process Yes visito Completed: Hospitalized since last visit: No Patient Requires Transmission-Based No Implantable device outside of the clinic excluding No Precautions: cellular tissue based products placed in the center Patient Has Alerts: Yes since last visit: Patient Alerts: L ABI 1.07 Has Dressing in Place as Prescribed: Yes (04/2019) Pain Present Now: No R ABI 1.14 (04/2019) Electronic Signature(s) Signed: 12/31/2019 5:44:18 PM By: [name] RN, BSN Entered By: [name] on 12/26/2019 13:04:08 -------------------------------------------------------------------------------- Encounter Discharge Information Details Patient Name: Date of Service: [name], [name] 12/26/2019 1:00 PM Medical Record [id number] Patient Account Number: [id number] Date of Birth/Sex: Treating RN: Jan 17, 1961 (59 y.o. [name] Primary Care Media Pizzini: Billey Gosling Other Clinician: Referring Shantrell Placzek: Treating Michille Mcelrath/Extender:Stone III, Elby Showers, Stacy Weeks in Treatment: 8 Encounter Discharge Information Items Post Procedure  Vitals Discharge Condition: Stable Temperature (F): 98.1 Ambulatory Status: Cane Pulse (bpm): 103 Discharge Destination: Home Respiratory Rate (breaths/min): 18 Transportation: Private Auto Blood Pressure (mmHg): 159/86 Accompanied By: self Schedule Follow-up Appointment: Yes Clinical Summary of Care: Patient Declined Electronic Signature(s) Signed: 12/26/2019 5:35:23 PM By: Carlene Coria RN Entered By: Carlene Coria on 12/26/2019 14:09:43 -------------------------------------------------------------------------------- Lower Extremity Assessment Details Patient Name: Date of Service: [name], [name] 12/26/2019 1:00 PM Medical Record [id number] Patient Account Number: [id number] Date of Birth/Sex: Treating RN: 03-Feb-1961 (59 y.o. [name] Primary Care Shayne Deerman: Billey Gosling Other Clinician: Referring Ellon Marasco: Treating Willeen Novak/Extender:Stone III, Elby Showers, Stacy Weeks in Treatment: 8 Edema Assessment Assessed: [Left: No] [Right: No] Edema: [Left: Yes] [Right: Yes] Calf Left: Right: Point of Measurement: 30 cm From Medial Instep 38 cm 40 cm Ankle Left: Right: Point of Measurement: 11 cm From Medial Instep 24.5 cm 28.5 cm Vascular Assessment Pulses: Dorsalis Pedis Palpable: [Left:Yes] [Right:Yes] Electronic Signature(s) Signed: 12/31/2019 5:44:18 PM By: [name] RN, BSN Entered By: [name] on 12/26/2019 13:08:11 -------------------------------------------------------------------------------- Multi-Disciplinary Care Plan Details Patient Name: Date of Service: [name], [name] 12/26/2019 1:00 PM Medical Record [id number] Patient Account Number: [id number] Date of Birth/Sex: Treating RN: 29-Oct-1960 (59 y.o. [name] Primary Care Selestino Nila: Other Clinician: Billey Gosling Referring Gilberto Stanforth: Treating Hitesh Fouche/Extender:Stone III, Elby Showers, Stacy Weeks in Treatment: 8 Active Inactive Nutrition Nursing Diagnoses: Potential for  alteratiion in Nutrition/Potential for imbalanced nutrition Goals: Patient/caregiver will maintain therapeutic glucose control Date Initiated: 10/31/2019 Target Resolution Date: 01/23/2020 Goal Status: Active Interventions: Assess HgA1c results as ordered upon admission and as needed Provide education on elevated blood sugars and impact on wound healing Treatment Activities: Patient referred to Primary Care Physician for further nutritional evaluation : 10/31/2019 Notes: Wound/Skin Impairment Nursing Diagnoses: Impaired tissue integrity Knowledge deficit related to ulceration/compromised skin integrity Goals: Patient/caregiver will verbalize understanding of skin care regimen Date Initiated: 10/31/2019 Target Resolution Date: 01/23/2020 Goal Status: Active Ulcer/skin breakdown will have a volume reduction of 30% by week 4 Date Initiated: 10/31/2019  Date Inactivated: 11/28/2019 Target Resolution Date: 11/28/2019 Unmet Reason: left Goal Status: Unmet improving, right unable to offload Ulcer/skin breakdown will have a volume reduction of 50% by week 8 Date Initiated: 11/28/2019 Date Inactivated: 12/26/2019 Target Resolution Date: 12/26/2019 Goal Status: Met Ulcer/skin breakdown will have a volume reduction of 80% by week 12 Date Initiated: 12/26/2019 Target Resolution Date: 01/23/2020 Goal Status: Active Interventions: Assess patient/caregiver ability to obtain necessary supplies Assess patient/caregiver ability to perform ulcer/skin care regimen upon admission and as needed Assess ulceration(s) every visit Provide education on ulcer and skin care Treatment Activities: Skin care regimen initiated : 10/31/2019 Topical wound management initiated : 10/31/2019 Notes: Electronic Signature(s) Signed: 12/26/2019 5:43:44 PM By: Baruch Gouty RN, BSN Entered By: Baruch Gouty on 12/26/2019 13:49:58 -------------------------------------------------------------------------------- Pain Assessment  Details Patient Name: Date of Service: [name], [name] 12/26/2019 1:00 PM Medical Record [id number] Patient Account Number: [id number] Date of Birth/Sex: Treating RN: [birth year] (59 y.o. [name] Primary Care Carrson Lightcap: Billey Gosling Other Clinician: Referring Daxter Paule: Treating Tayquan Gassman/Extender:Stone III, Elby Showers, Stacy Weeks in Treatment: 8 Active Problems Location of Pain Severity and Description of Pain Patient Has Paino No Site Locations Pain Management and Medication Current Pain Management: Electronic Signature(s) Signed: 12/31/2019 5:44:18 PM By: [name] RN, BSN Entered By: [name] on 12/26/2019 06:23:76 -------------------------------------------------------------------------------- Patient/Caregiver Education Details Patient Name: Date of Service: [name], [name] 3/10/2021andnbsp1:00 PM Medical Record [id number] Patient Account Number: [id number] Date of Birth/Gender: [birth year] (58 y.o. M) Treating RN: Baruch Gouty Primary Care Physician: Billey Gosling Other Clinician: Referring Physician: Treating Physician/Extender:Stone III, Elby Showers, Nadine Counts in Treatment: 8 Education Assessment Education Provided To: Patient Education Topics Provided Offloading: Methods: Explain/Verbal Responses: Reinforcements needed, State content correctly Wound/Skin Impairment: Methods: Explain/Verbal Responses: Reinforcements needed, State content correctly Electronic Signature(s) Signed: 12/26/2019 5:43:44 PM By: Baruch Gouty RN, BSN Entered By: Baruch Gouty on 12/26/2019 13:51:28 -------------------------------------------------------------------------------- Wound Assessment Details Patient Name: Date of Service: [name], [name] 12/26/2019 1:00 PM Medical Record [id number] Patient Account Number: [id number] Date of Birth/Sex: Treating RN: [birth year] (59 y.o. [name] Primary Care Jeronda Don: Billey Gosling Other  Clinician: Referring Koben Daman: Treating Zoya Sprecher/Extender:Stone III, Elby Showers, Stacy Weeks in Treatment: 8 Wound Status Wound Number: 1 Primary Diabetic Wound/Ulcer of the Lower Extremity Etiology: Wound Location: Left Foot - Plantar Wound Open Wounding Event: Gradually Appeared Status: Date Acquired: 02/16/2019 Comorbid Anemia, Sleep Apnea, Congestive Heart Weeks Of Treatment: 8 History: Failure, Hypertension, Peripheral Venous Clustered Wound: No Disease, Cirrhosis , Type II Diabetes, Osteoarthritis, Neuropathy Photos Wound Measurements Length: (cm) 2 Width: (cm) 2.1 Depth: (cm) 0.1 Area: (cm) 3.299 Volume: (cm) 0.33 Wound Description Classification: Grade 2 Wound Margin: Flat and Intact Exudate Amount: Medium Exudate Type: Serosanguineous Exudate Color: red, brown Wound Bed Granulation Amount: Large (67-100%) Granulation Quality: Red, Pink Necrotic Amount: None Present (0%) ter Cleansing: No no No Exposed Structure d: No bcutaneous Tissue) Exposed: Yes d: No d: No : No No % Reduction in Area: 63.2% % Reduction in Volume: 94.7% Epithelialization: Small (1-33%) Tunneling: No Undermining: No Foul Odor Af Slough/Fibri Fascia Expose Fat Layer (Su Tendon Expose Muscle Expose Joint Exposed Bone Exposed: Treatment Notes Wound #1 (Left, Plantar Foot) 1. Cleanse With Wound Cleanser 3. Primary Dressing Applied Hydrofera Blue 4. Secondary Dressing Dry Gauze Roll Gauze 5. Secured With Recruitment consultant) Signed: 12/28/2019 4:47:30 PM By: Mikeal Hawthorne EMT/HBOT Signed: 12/28/2019 5:31:10 PM By: Baruch Gouty RN, BSN Entered By: Mikeal Hawthorne on 12/28/2019 14:57:17 -------------------------------------------------------------------------------- Wound Assessment Details Patient Name: Date of Service: [name], [name] 12/26/2019  1:00 PM Medical Record [id number] Patient Account Number: [id number] Date of Birth/Sex: Treating RN: 1960/12/01  (59 y.o. [name] Primary Care Ugo Thoma: Billey Gosling Other Clinician: Referring Thu Baggett: Treating Delmer Kowalski/Extender:Stone III, Elby Showers, Stacy Weeks in Treatment: 8 Wound Status Wound Number: 2 Primary Diabetic Wound/Ulcer of the Lower Extremity Etiology: Wound Location: Right Metatarsal head second Wound Open Wounding Event: Gradually Appeared Status: Date Acquired: 02/16/2019 Comorbid Anemia, Sleep Apnea, Congestive Heart Weeks Of Treatment: 8 History: Failure, Hypertension, Peripheral Venous Clustered Wound: No Disease, Cirrhosis , Type II Diabetes, Osteoarthritis, Neuropathy Photos Wound Measurements Length: (cm) 2.6 Width: (cm) 2 Depth: (cm) 1.6 Area: (cm) 4.084 Volume: (cm) 6.535 % Reduction in Area: -85.7% % Reduction in Volume: -147.6% Epithelialization: Small (1-33%) Tunneling: No Undermining: Yes Starting Position (o'clock): 3 Ending Position (o'clock): 6 Maximum Distance: (cm) 0.2 Wound Description Classification: Grade 2 Foul Odor Aft Wound Margin: Flat and Intact Slough/Fibrin Exudate Amount: Medium Exudate Type: Serosanguineous Exudate Color: red, brown Wound Bed Granulation Amount: Large (67-100%) Granulation Quality: Pink Fascia Exposed Necrotic Amount: None Present (0%) Fat Layer (Sub Tendon Exposed Muscle Exposed Joint Exposed: Bone Exposed: er Cleansing: No o No Exposed Structure : No cutaneous Tissue) Exposed: Yes : No : No No No Treatment Notes Wound #2 (Right Metatarsal head second) 1. Cleanse With Wound Cleanser 3. Primary Dressing Applied Hydrofera Blue 4. Secondary Dressing Dry Gauze Roll Gauze 5. Secured With Recruitment consultant) Signed: 12/28/2019 4:47:30 PM By: Mikeal Hawthorne EMT/HBOT Signed: 12/28/2019 5:31:10 PM By: Baruch Gouty RN, BSN Entered By: Mikeal Hawthorne on 12/28/2019 14:57:38 -------------------------------------------------------------------------------- Vitals Details Patient  Name: Date of Service: [name], [name] 12/26/2019 1:00 PM Medical Record [id number] Patient Account Number: [id number] Date of Birth/Sex: Treating RN: [birth year] (59 y.o. [name] Primary Care Pawel Soules: Billey Gosling Other Clinician: Referring Quinette Hentges: Treating Danaysia Rader/Extender:Stone III, Elby Showers, Stacy Weeks in Treatment: 8 Vital Signs Time Taken: 13:05 Temperature (F): 98.1 Height (in): 73 Pulse (bpm): 103 Weight (lbs): 260 Respiratory Rate (breaths/min): 18 Body Mass Index (BMI): 34.3 Blood Pressure (mmHg): 159/86 Reference Range: 80 - 120 mg / dl Electronic Signature(s) Signed: 12/31/2019 5:44:18 PM By: [name] RN, BSN Entered By: [name] on 12/26/2019 13:05:59

## 2020-01-02 ENCOUNTER — Telehealth: Payer: Self-pay | Admitting: Internal Medicine

## 2020-01-02 ENCOUNTER — Other Ambulatory Visit: Payer: Self-pay

## 2020-01-02 ENCOUNTER — Encounter (HOSPITAL_BASED_OUTPATIENT_CLINIC_OR_DEPARTMENT_OTHER): Payer: Medicare (Managed Care) | Admitting: Physician Assistant

## 2020-01-02 DIAGNOSIS — E11621 Type 2 diabetes mellitus with foot ulcer: Secondary | ICD-10-CM | POA: Diagnosis not present

## 2020-01-02 NOTE — Telephone Encounter (Signed)
New message:    1.Medication Requested: levothyroxine (SYNTHROID) 50 MCG tablet metoprolol tartrate (LOPRESSOR) 50 MG tablet(Expired)-not prescribed by Dr.Burns 2. Pharmacy (Name, Street, [place]): [id number] - [place], [place] - [address] 3. On Med List: yes  4. Last Visit with PCP: 10/29/19  5. Next visit date with PCP:02/25/20   Agent: Please be advised that RX refills may take up to 3 business days. We ask that you follow-up with your pharmacy.

## 2020-01-02 NOTE — Progress Notes (Addendum)
RC, AMISON ([id number]) Visit Report for 01/02/2020 Chief Complaint Document Details Patient Name: Date of Service: [name], [name] 01/02/2020 2:45 PM Medical Record [id number] Patient Account Number: [id number] Date of Birth/Sex: Treating RN: [birth year] (59 y.o. [name] Primary Care Provider: [name] Other Clinician: Referring Provider: Treating Provider/Extender:Stone III, Dayton Martes, Stacy Weeks in Treatment: 9 Information Obtained from: Patient Chief Complaint Bilateral foot diabetic ulcers Electronic Signature(s) Signed: 01/02/2020 2:47:38 PM By: Lenda Kelp PA-C Entered By: Lenda Kelp on 01/02/2020 14:47:38 -------------------------------------------------------------------------------- Debridement Details Patient Name: Date of Service: [name], [name] 01/02/2020 2:45 PM Medical Record [id number] Patient Account Number: [id number] Date of Birth/Sex: Treating RN: 1961-06-12 (59 y.o. [name] Primary Care Provider: [name] Other Clinician: Referring Provider: Treating Provider/Extender:Stone III, Dayton Martes, Stacy Weeks in Treatment: 9 Debridement Performed for Wound #1 Left,Plantar Foot Assessment: Performed By: Physician Lenda Kelp, PA Debridement Type: Debridement Severity of Tissue Pre Fat layer exposed Debridement: Level of Consciousness (Pre- Awake and Alert procedure): Pre-procedure Verification/Time Out Taken: Yes - 15:30 Start Time: 15:30 Pain Control: Other : benzocaine 20% sspray Total Area Debrided (L x W): 2.3 (cm) x 2.3 (cm) = 5.29 (cm) Tissue and other material Non-Viable, Callus, Skin: Epidermis debrided: Level: Skin/Epidermis Debridement Description: Selective/Open Wound Instrument: Curette Bleeding: None End Time: 15:34 Procedural Pain: 0 Post Procedural Pain: 0 Response to Treatment: Procedure was tolerated well Level of Consciousness Awake and Alert (Post-procedure): Post Debridement  Measurements of Total Wound Length: (cm) 1.9 Width: (cm) 1.8 Depth: (cm) 0.1 Volume: (cm) 0.269 Character of Wound/Ulcer Post Improved Debridement: Severity of Tissue Post Debridement: Fat layer exposed Post Procedure Diagnosis Same as Pre-procedure Electronic Signature(s) Signed: 01/02/2020 4:26:16 PM By: Lenda Kelp PA-C Signed: 01/02/2020 4:42:28 PM By: Zenaida Deed RN, BSN Entered By: Zenaida Deed on 01/02/2020 15:33:23 -------------------------------------------------------------------------------- Debridement Details Patient Name: Date of Service: [name], [name] 01/02/2020 2:45 PM Medical Record [id number] Patient Account Number: [id number] Date of Birth/Sex: Treating RN: 07-Jul-1961 (59 y.o. [name] Primary Care Provider: [name] Other Clinician: Referring Provider: Treating Provider/Extender:Stone III, Dayton Martes, Stacy Weeks in Treatment: 9 Debridement Performed for Wound #2 Right Metatarsal head second Assessment: Performed By: Physician Lenda Kelp, PA Debridement Type: Debridement Severity of Tissue Pre Fat layer exposed Debridement: Level of Consciousness (Pre- Awake and Alert procedure): Pre-procedure Verification/Time Out Taken: Yes - 15:30 Start Time: 15:30 Pain Control: Other : benzocaine 20% sspray Total Area Debrided (L x W): 2.4 (cm) x 1.8 (cm) = 4.32 (cm) Tissue and other material Non-Viable, Callus, Skin: Epidermis debrided: Level: Skin/Epidermis Debridement Description: Selective/Open Wound Instrument: Curette Bleeding: None End Time: 15:35 Procedural Pain: 0 Post Procedural Pain: 0 Response to Treatment: Procedure was tolerated well Level of Consciousness Awake and Alert (Post-procedure): Post Debridement Measurements of Total Wound Length: (cm) 2.4 Width: (cm) 1.8 Depth: (cm) 1.3 Volume: (cm) 4.411 Character of Wound/Ulcer Post Improved Debridement: Severity of Tissue Post Debridement: Fat layer  exposed Post Procedure Diagnosis Same as Pre-procedure Electronic Signature(s) Signed: 01/02/2020 4:26:16 PM By: Lenda Kelp PA-C Signed: 01/02/2020 4:42:28 PM By: Zenaida Deed RN, BSN Entered By: Zenaida Deed on 01/02/2020 15:34:59 -------------------------------------------------------------------------------- HPI Details Patient Name: Date of Service: [name], [name] 01/02/2020 2:45 PM Medical Record [id number] Patient Account Number: [id number] Date of Birth/Sex: Treating RN: 1961/03/27 (59 y.o. [name] Primary Care Provider: [name] Other Clinician: Referring Provider: Treating Provider/Extender:Stone III, Dayton Martes, Stacy Weeks in Treatment: 9 History of Present Illness HPI Description: 10/31/2019 upon evaluation today patient appears to  be doing somewhat poorly in regard to his bilateral plantar feet. He has wounds that he tells me have been present since 2012 intermittently off and on. Most recently this has been open for at least the past 6 months to a year. He has been trying to treat this in different ways using Santyl along with various other dressings including Medihoney and even at one point Xeroform. Nothing really has seem to get this completely closed. He was recently in the hospital for cellulitis of his leg subsequently he did have x-rays as well as MRIs that showed negative for any signs of osteomyelitis in regard to the wounds on his feet. Fortunately there is no signs of systemic infection at this time. No fevers, chills, nausea, vomiting, or diarrhea. Patient has previously used Darco offloading shoes as far as frontal floaters as well as postop surgical shoes. He has never been in a total contact cast that may be something we need to strongly consider here. Patient's most recent hemoglobin A1c 1 month ago was 5.3 seems to be very well controlled which is great. Subsequently he has seen vascular as well as podiatry. His ABIs are 1.07 on  the left and 1.14 on the right he seems to be doing well he does have chronic venous stasis. 11/07/2019 upon evaluation today patient appears to be doing well with regard to his wounds all things considered. I do not see any severe worsening he still has some callus buildup on the right more than the left he notes that he has been probably more active than he should as far as walking is concerned is just very hard to not be active. He knows he needs to be more careful in this regard however. He is willing to give the cast a try at this point although he notes that he is a little nervous about this just with regard to balance although he will be very careful and obviously if he has any trouble he knows to contact the office and let me know. 1/22; patient is in for his obligatory first total contact cast change. Our intake nurse reported a very large amount of drainage which is spelled out over to the surrounding skin. Has bilateral diabetic foot wounds. He has Charcot feet. We have been using silver alginate on his wounds. 11/14/2019 on evaluation today patient is actually seeming to make good improvement in regard to his bilateral plantar foot wounds. We have been using a cast on the left side and on the right side he has been using dressings he is changing up his own accord. With that being said he tells me that he is also not walking as much just due to how unsteady he feels. He takes it easy when he does have to walk and when he does not have to walk he is resting. This is probably help in his right foot as well has the left foot which is actually measuring better. In fact both are measuring better. Overall I am very pleased with how things seem to be progressing. The patient does have some odor on the left foot this does have me concerned about the possibility of infection, and actually probably go ahead and put him on antibiotics today as well as utilizing a continuation of the cast on the left  foot I think that will be fine we probably just need to bring him in sooner to change this not last a whole week. 1/29; we brought the patient back today for  a total contact cast change on the left out of concern for excessive drainage. We are using drawtex over the wound as the primary dressing 11/21/2019 on evaluation today patient appears to be doing well with regard to his left plantar foot. In fact both foot ulcers actually seem to be doing pretty well. Nonetheless he is having a lot of drainage on the left at this time and again we did obtain a wound culture did show positive for Staph aureus that was reviewed by myself today as well. Nonetheless he is on Bactrim which was shown to be sensitive that should be helping in this regard. Fortunately there is no signs of infection systemically at this point. 2/5; back in clinic today for a total contact cast change apparently secondary to very significant drainage. Still using drawtex 11/28/2019 upon evaluation today patient appears to be doing well with regard to his wounds. The right foot is doing okay as measured about the same in my opinion. The left foot is actually showing signs of significant improvement is measuring smaller there is a lot of hyper granulation likely due to the continued drainage at this point. We did obtain approval for a snap VAC I think that is good to be appropriate for him and will likely help this tremendously underneath the cast. He is definitely in agreement with proceeding with such. 2/12; patient came in today after his snap VAC lost suction. Brought in to see one of our nurses. The dressing was replaced and then we put the cast back on and rehooked up the snap VAC. Apparently his wound looked very good per our intake nurse. 2/15; again we replaced the cast on Friday. By Saturday the snap VAC and light suction. He called this morning he comes in acutely. The wounds look fine however the VAC is not functioning. We  replaced the cast using silver alginate as the primary dressing backed with Kerramax. The snap VAC was not replaced 12/05/2019 upon evaluation today patient appears to be doing better in regard to his left plantar foot ulcer. Fortunately there is no signs of active infection at this time. Unfortunately he is continuing to have issues with the right foot he is really not making any progress here things seem to be somewhat stagnant to be honest. The depth has increased but that is due to me having debrided the wound in the past based on what I am seeing. 12/12/2019 on evaluation today patient appears to be doing more poorly in regard to the left lower extremity. He has some erythema spreading up the side of his foot I am concerned about infection again at this point. Unfortunately he has been seeing improvement with a total contact cast but I do not think we should put that on today. On his right plantar foot he continues to have significant drainage this is actually measuring deeper I really do not feel like you are making any progress whatsoever. I have prescribed Granix for him unfortunately his insurance apparently was going to cost him a $500 co-pay. 12/19/2019 upon evaluation today patient actually appears to be doing better in regard to both wounds. With that being said he actually did get the reGranix which he had to pay $500 for. With that being said it does look like that he is actually made some improvement based on what I am seeing at this point with the reGranix. Obviously if he is going to continue this we are going to do something about trying to get him  some help in covering the cost. 12/26/2019 on evaluation today patient appears to be doing really much better even compared to last week. Overall the wound seems to be much better even compared to last week and last week was better than the week before. Since has been using the reGranix his symptoms have improved significantly. With that  being said the issue right now is simply that this is a very expensive medication for him the first dose cost him $500. Upon inspection patient's wound bed actually is however dramatically improved compared to before he started this 2 weeks ago. 01/02/2020 upon evaluation today patient appears to actually be doing well. He still had a little bit of reGranix left that has been using in small amounts he just been applying it every other day instead of every day in order to make it go longer. Overall we are still seeing excellent improvement he is measuring smaller looking better healthier tissue and everything seems to be pointing to this headed in the right direction. Fortunately there is no evidence of infection either which is also excellent news. He does have his MRI coming up within the next week. Electronic Signature(s) Signed: 01/02/2020 3:38:52 PM By: Lenda Kelp PA-C Entered By: Lenda Kelp on 01/02/2020 15:38:52 -------------------------------------------------------------------------------- Physical Exam Details Patient Name: Date of Service: PAXTON, BINNS 01/02/2020 2:45 PM Medical Record [id number] Patient Account Number: [id number] Date of Birth/Sex: Treating RN: 09-28-1961 (59 y.o. [name] Primary Care Provider: [name] Other Clinician: Referring Provider: Treating Provider/Extender:Stone III, Dayton Martes, Stacy Weeks in Treatment: 9 Constitutional Well-nourished and well-hydrated in no acute distress. Respiratory normal breathing without difficulty. Psychiatric this patient is able to make decisions and demonstrates good insight into disease process. Alert and Oriented x 3. pleasant and cooperative. Notes Patient's wound bed currently showed signs of good granulation at this time. Fortunately there is no evidence of active infection which is great news. No fevers, chills, nausea, vomiting, or diarrhea. I did perform some sharp debridement to  remove callus from around the edges of the wound otherwise there was no need for sharp debridement the central portion of the wound and appears to be doing wonderful. Electronic Signature(s) Signed: 01/02/2020 3:39:23 PM By: Lenda Kelp PA-C Entered By: Lenda Kelp on 01/02/2020 15:39:22 -------------------------------------------------------------------------------- Physician Orders Details Patient Name: Date of Service: WING, SCHOCH 01/02/2020 2:45 PM Medical Record [id number] Patient Account Number: [id number] Date of Birth/Sex: Treating RN: [birth year] (59 y.o. [name] Primary Care Provider: [name] Other Clinician: Referring Provider: Treating Provider/Extender:Stone III, Dayton Martes, Stacy Weeks in Treatment: 9 Verbal / Phone Orders: No Diagnosis Coding ICD-10 Coding Code Description E11.622 Type 2 diabetes mellitus with other skin ulcer L97.522 Non-pressure chronic ulcer of other part of left foot with fat layer exposed L97.512 Non-pressure chronic ulcer of other part of right foot with fat layer exposed I10 Essential (primary) hypertension I50.42 Chronic combined systolic (congestive) and diastolic (congestive) heart failure I87.2 Venous insufficiency (chronic) (peripheral) L84 Corns and callosities Follow-up Appointments Return Appointment in 1 week. Dressing Change Frequency Wound #1 Left,Plantar Foot Change dressing every day. Wound #2 Right Metatarsal head second Change dressing every day. Wound Cleansing Wound #1 Left,Plantar Foot May shower and wash wound with soap and water. Wound #2 Right Metatarsal head second May shower and wash wound with soap and water. Primary Wound Dressing Wound #1 Left,Plantar Foot Hydrofera Blue Other: - regranex apply in the evening and remove in the morning Wound #2 Right Metatarsal head second  Hydrofera Blue Other: - regranex apply in the evening and remove in the morning Secondary Dressing Wound  #1 Left,Plantar Foot Kerlix/Rolled Gauze Dry Gauze ABD pad Wound #2 Right Metatarsal head second Kerlix/Rolled Gauze Dry Gauze Other: - felt callous pad Edema Control Patient to wear own compression stockings - daily to both legs Avoid standing for long periods of time Elevate legs to the level of the heart or above for 30 minutes daily and/or when sitting, a frequency of: Off-Loading Open toe surgical shoe to: - both feet Patient Medications Allergies: Claritin Notifications Medication Indication Start End Regranex 01/02/2020 DOSE topical 0.01 % gel - gel topical applied to the wound bed of each of your feet daily as directed in the clinic under your dressings Electronic Signature(s) Signed: 01/02/2020 3:44:08 PM By: Lenda Kelp PA-C Entered By: Lenda Kelp on 01/02/2020 15:44:07 -------------------------------------------------------------------------------- Problem List Details Patient Name: Date of Service: CORNEILUS, HEGGIE 01/02/2020 2:45 PM Medical Record [id number] Patient Account Number: [id number] Date of Birth/Sex: Treating RN: 10-14-1961 (59 y.o. [name] Primary Care Provider: [name] Other Clinician: Referring Provider: Treating Provider/Extender:Stone III, Dayton Martes, Stacy Weeks in Treatment: 9 Active Problems ICD-10 Evaluated Encounter Code Description Active Date Today Diagnosis E11.622 Type 2 diabetes mellitus with other skin ulcer 10/31/2019 No Yes L97.522 Non-pressure chronic ulcer of other part of left foot 10/31/2019 No Yes with fat layer exposed L97.512 Non-pressure chronic ulcer of other part of right foot 10/31/2019 No Yes with fat layer exposed I10 Essential (primary) hypertension 10/31/2019 No Yes I50.42 Chronic combined systolic (congestive) and diastolic 10/31/2019 No Yes (congestive) heart failure I87.2 Venous insufficiency (chronic) (peripheral) 10/31/2019 No Yes L84 Corns and callosities 11/14/2019 No Yes Inactive  Problems Resolved Problems Electronic Signature(s) Signed: 01/02/2020 2:47:33 PM By: Lenda Kelp PA-C Entered By: Lenda Kelp on 01/02/2020 14:47:32 -------------------------------------------------------------------------------- Progress Note Details Patient Name: Date of Service: DUNCAN, ALEJANDRO 01/02/2020 2:45 PM Medical Record [id number] Patient Account Number: [id number] Date of Birth/Sex: Treating RN: [birth year] (59 y.o. [name] Primary Care Provider: [name] Other Clinician: Referring Provider: Treating Provider/Extender:Stone III, Dayton Martes, Stacy Weeks in Treatment: 9 Subjective Chief Complaint Information obtained from Patient Bilateral foot diabetic ulcers History of Present Illness (HPI) 10/31/2019 upon evaluation today patient appears to be doing somewhat poorly in regard to his bilateral plantar feet. He has wounds that he tells me have been present since 2012 intermittently off and on. Most recently this has been open for at least the past 6 months to a year. He has been trying to treat this in different ways using Santyl along with various other dressings including Medihoney and even at one point Xeroform. Nothing really has seem to get this completely closed. He was recently in the hospital for cellulitis of his leg subsequently he did have x-rays as well as MRIs that showed negative for any signs of osteomyelitis in regard to the wounds on his feet. Fortunately there is no signs of systemic infection at this time. No fevers, chills, nausea, vomiting, or diarrhea. Patient has previously used Darco offloading shoes as far as frontal floaters as well as postop surgical shoes. He has never been in a total contact cast that may be something we need to strongly consider here. Patient's most recent hemoglobin A1c 1 month ago was 5.3 seems to be very well controlled which is great. Subsequently he has seen vascular as well as podiatry. His ABIs are  1.07 on the left and 1.14 on the right he seems  to be doing well he does have chronic venous stasis. 11/07/2019 upon evaluation today patient appears to be doing well with regard to his wounds all things considered. I do not see any severe worsening he still has some callus buildup on the right more than the left he notes that he has been probably more active than he should as far as walking is concerned is just very hard to not be active. He knows he needs to be more careful in this regard however. He is willing to give the cast a try at this point although he notes that he is a little nervous about this just with regard to balance although he will be very careful and obviously if he has any trouble he knows to contact the office and let me know. 1/22; patient is in for his obligatory first total contact cast change. Our intake nurse reported a very large amount of drainage which is spelled out over to the surrounding skin. Has bilateral diabetic foot wounds. He has Charcot feet. We have been using silver alginate on his wounds. 11/14/2019 on evaluation today patient is actually seeming to make good improvement in regard to his bilateral plantar foot wounds. We have been using a cast on the left side and on the right side he has been using dressings he is changing up his own accord. With that being said he tells me that he is also not walking as much just due to how unsteady he feels. He takes it easy when he does have to walk and when he does not have to walk he is resting. This is probably help in his right foot as well has the left foot which is actually measuring better. In fact both are measuring better. Overall I am very pleased with how things seem to be progressing. The patient does have some odor on the left foot this does have me concerned about the possibility of infection, and actually probably go ahead and put him on antibiotics today as well as utilizing a continuation of the cast on  the left foot I think that will be fine we probably just need to bring him in sooner to change this not last a whole week. 1/29; we brought the patient back today for a total contact cast change on the left out of concern for excessive drainage. We are using drawtex over the wound as the primary dressing 11/21/2019 on evaluation today patient appears to be doing well with regard to his left plantar foot. In fact both foot ulcers actually seem to be doing pretty well. Nonetheless he is having a lot of drainage on the left at this time and again we did obtain a wound culture did show positive for Staph aureus that was reviewed by myself today as well. Nonetheless he is on Bactrim which was shown to be sensitive that should be helping in this regard. Fortunately there is no signs of infection systemically at this point. 2/5; back in clinic today for a total contact cast change apparently secondary to very significant drainage. Still using drawtex 11/28/2019 upon evaluation today patient appears to be doing well with regard to his wounds. The right foot is doing okay as measured about the same in my opinion. The left foot is actually showing signs of significant improvement is measuring smaller there is a lot of hyper granulation likely due to the continued drainage at this point. We did obtain approval for a snap VAC I think that is good to  be appropriate for him and will likely help this tremendously underneath the cast. He is definitely in agreement with proceeding with such. 2/12; patient came in today after his snap VAC lost suction. Brought in to see one of our nurses. The dressing was replaced and then we put the cast back on and rehooked up the snap VAC. Apparently his wound looked very good per our intake nurse. 2/15; again we replaced the cast on Friday. By Saturday the snap VAC and light suction. He called this morning he comes in acutely. The wounds look fine however the VAC is not  functioning. We replaced the cast using silver alginate as the primary dressing backed with Kerramax. The snap VAC was not replaced 12/05/2019 upon evaluation today patient appears to be doing better in regard to his left plantar foot ulcer. Fortunately there is no signs of active infection at this time. Unfortunately he is continuing to have issues with the right foot he is really not making any progress here things seem to be somewhat stagnant to be honest. The depth has increased but that is due to me having debrided the wound in the past based on what I am seeing. 12/12/2019 on evaluation today patient appears to be doing more poorly in regard to the left lower extremity. He has some erythema spreading up the side of his foot I am concerned about infection again at this point. Unfortunately he has been seeing improvement with a total contact cast but I do not think we should put that on today. On his right plantar foot he continues to have significant drainage this is actually measuring deeper I really do not feel like you are making any progress whatsoever. I have prescribed Granix for him unfortunately his insurance apparently was going to cost him a $500 co-pay. 12/19/2019 upon evaluation today patient actually appears to be doing better in regard to both wounds. With that being said he actually did get the reGranix which he had to pay $500 for. With that being said it does look like that he is actually made some improvement based on what I am seeing at this point with the reGranix. Obviously if he is going to continue this we are going to do something about trying to get him some help in covering the cost. 12/26/2019 on evaluation today patient appears to be doing really much better even compared to last week. Overall the wound seems to be much better even compared to last week and last week was better than the week before. Since has been using the reGranix his symptoms have improved  significantly. With that being said the issue right now is simply that this is a very expensive medication for him the first dose cost him $500. Upon inspection patient's wound bed actually is however dramatically improved compared to before he started this 2 weeks ago. 01/02/2020 upon evaluation today patient appears to actually be doing well. He still had a little bit of reGranix left that has been using in small amounts he just been applying it every other day instead of every day in order to make it go longer. Overall we are still seeing excellent improvement he is measuring smaller looking better healthier tissue and everything seems to be pointing to this headed in the right direction. Fortunately there is no evidence of infection either which is also excellent news. He does have his MRI coming up within the next week. Objective Constitutional Well-nourished and well-hydrated in no acute distress. Vitals Time Taken:  3:15 PM, Height: 73 in, Weight: 260 lbs, BMI: 34.3, Temperature: 97.9 F, Pulse: 105 bpm, Respiratory Rate: 18 breaths/min, Blood Pressure: 145/79 mmHg. Respiratory normal breathing without difficulty. Psychiatric this patient is able to make decisions and demonstrates good insight into disease process. Alert and Oriented x 3. pleasant and cooperative. General Notes: Patient's wound bed currently showed signs of good granulation at this time. Fortunately there is no evidence of active infection which is great news. No fevers, chills, nausea, vomiting, or diarrhea. I did perform some sharp debridement to remove callus from around the edges of the wound otherwise there was no need for sharp debridement the central portion of the wound and appears to be doing wonderful. Integumentary (Hair, Skin) Wound #1 status is Open. Original cause of wound was Gradually Appeared. The wound is located on the [place]. The wound measures 1.9cm length x 1.8cm width x 0.1cm depth;  2.686cm^2 area and 0.269cm^3 volume. There is Fat Layer (Subcutaneous Tissue) Exposed exposed. There is no tunneling or undermining noted. There is a medium amount of serosanguineous drainage noted. The wound margin is flat and intact. There is large (67-100%) red, pink granulation within the wound bed. There is no necrotic tissue within the wound bed. General Notes: callus periwound noted. Wound #2 status is Open. Original cause of wound was Gradually Appeared. The wound is located on the Right Metatarsal head second. The wound measures 2.4cm length x 1.8cm width x 1.3cm depth; 3.393cm^2 area and 4.411cm^3 volume. There is Fat Layer (Subcutaneous Tissue) Exposed exposed. There is no tunneling or undermining noted. There is a medium amount of serosanguineous drainage noted. The wound margin is flat and intact. There is large (67-100%) pink granulation within the wound bed. There is no necrotic tissue within the wound bed. Assessment Active Problems ICD-10 Type 2 diabetes mellitus with other skin ulcer Non-pressure chronic ulcer of other part of left foot with fat layer exposed Non-pressure chronic ulcer of other part of right foot with fat layer exposed Essential (primary) hypertension Chronic combined systolic (congestive) and diastolic (congestive) heart failure Venous insufficiency (chronic) (peripheral) Corns and callosities Procedures Wound #1 Pre-procedure diagnosis of Wound #1 is a Diabetic Wound/Ulcer of the Lower Extremity located on the Left,Plantar Foot .Severity of Tissue Pre Debridement is: Fat layer exposed. There was a Selective/Open Wound Skin/Epidermis Debridement with a total area of 5.29 sq cm performed by Worthy Keeler, PA. With the following instrument(s): Curette to remove Non-Viable tissue/material. Material removed includes Callus and Skin: Epidermis and after achieving pain control using Other (benzocaine 20% sspray). No specimens were taken. A time out  was conducted at 15:30, prior to the start of the procedure. There was no bleeding. The procedure was tolerated well with a pain level of 0 throughout and a pain level of 0 following the procedure. Post Debridement Measurements: 1.9cm length x 1.8cm width x 0.1cm depth; 0.269cm^3 volume. Character of Wound/Ulcer Post Debridement is improved. Severity of Tissue Post Debridement is: Fat layer exposed. Post procedure Diagnosis Wound #1: Same as Pre-Procedure Wound #2 Pre-procedure diagnosis of Wound #2 is a Diabetic Wound/Ulcer of the Lower Extremity located on the Right Metatarsal head second .Severity of Tissue Pre Debridement is: Fat layer exposed. There was a Selective/Open Wound Skin/Epidermis Debridement with a total area of 4.32 sq cm performed by Worthy Keeler, PA. With the following instrument(s): Curette to remove Non-Viable tissue/material. Material removed includes Callus and Skin: Epidermis and after achieving pain control using Other (benzocaine 20% sspray). No specimens  were taken. A time out was conducted at 15:30, prior to the start of the procedure. There was no bleeding. The procedure was tolerated well with a pain level of 0 throughout and a pain level of 0 following the procedure. Post Debridement Measurements: 2.4cm length x 1.8cm width x 1.3cm depth; 4.411cm^3 volume. Character of Wound/Ulcer Post Debridement is improved. Severity of Tissue Post Debridement is: Fat layer exposed. Post procedure Diagnosis Wound #2: Same as Pre-Procedure Plan Follow-up Appointments: Return Appointment in 1 week. Dressing Change Frequency: Wound #1 Left,Plantar Foot: Change dressing every day. Wound #2 Right Metatarsal head second: Change dressing every day. Wound Cleansing: Wound #1 Left,Plantar Foot: May shower and wash wound with soap and water. Wound #2 Right Metatarsal head second: May shower and wash wound with soap and water. Primary Wound Dressing: Wound #1 Left,Plantar  Foot: Hydrofera Blue Other: - regranex apply in the evening and remove in the morning Wound #2 Right Metatarsal head second: Hydrofera Blue Other: - regranex apply in the evening and remove in the morning Secondary Dressing: Wound #1 Left,Plantar Foot: Kerlix/Rolled Gauze Dry Gauze ABD pad Wound #2 Right Metatarsal head second: Kerlix/Rolled Gauze Dry Gauze Other: - felt callous pad Edema Control: Patient to wear own compression stockings - daily to both legs Avoid standing for long periods of time Elevate legs to the level of the heart or above for 30 minutes daily and/or when sitting, a frequency of: Off-Loading: Open toe surgical shoe to: - both feet The following medication(s) was prescribed: Regranex topical 0.01 % gel gel topical applied to the wound bed of each of your feet daily as directed in the clinic under your dressings starting 01/02/2020 1. Patient has been doing excellent with regard to the reGranix and his wounds. Overall I feel like this has been an excellent improvement for him with utilization of this medication and I am very pleased in that regard. 2. I am also can recommend that we continue to use the [facility] over top of this that seems to have done well as well. 3. I am also going to suggest the patient continue with the open toe surgical shoes to both feet for offloading that has done He is also trying to limit his walking overall I think he is doing excellent in all respects he is done everything I recommended reGranix has also been beneficial unfortunately is having trouble getting the insurance to cover this. We will see patient back for reevaluation in 1 week here in the clinic. If anything worsens or changes patient will contact our office for additional recommendations. Electronic Signature(s) Signed: 01/02/2020 3:44:26 PM By: Lenda Kelp PA-C Previous Signature: 01/02/2020 3:40:33 PM Version By: Lenda Kelp PA-C Entered By: Lenda Kelp on 01/02/2020 15:44:25 -------------------------------------------------------------------------------- SuperBill Details Patient Name: Date of Service: NATHANEAL, SOMMERS 01/02/2020 Medical Record [id number] Patient Account Number: [id number] Date of Birth/Sex: Treating RN: [birth year] (59 y.o. [name] Primary Care Provider: [name] Other Clinician: Referring Provider: Treating Provider/Extender:Stone III, Dayton Martes, Stacy Weeks in Treatment: 9 Diagnosis Coding ICD-10 Codes Code Description E11.622 Type 2 diabetes mellitus with other skin ulcer L97.522 Non-pressure chronic ulcer of other part of left foot with fat layer exposed L97.512 Non-pressure chronic ulcer of other part of right foot with fat layer exposed I10 Essential (primary) hypertension I50.42 Chronic combined systolic (congestive) and diastolic (congestive) heart failure I87.2 Venous insufficiency (chronic) (peripheral) L84 Corns and callosities Facility Procedures CPT4 Code Description: 21308657 97597 - DEBRIDE WOUND 1ST 20  SQ CM OR < ICD-10 Diagnosis Description L97.522 Non-pressure chronic ulcer of other part of left foot with L97.512 Non-pressure chronic ulcer of other part of right foot with Modifier: fat layer exp fat layer ex Quantity: 1 osed posed Physician Procedures CPT4 Code Description: [id number] - WC PHYS DEBR WO ANESTH 20 SQ CM ICD-10 Diagnosis Description L97.522 Non-pressure chronic ulcer of other part of left foot with L97.512 Non-pressure chronic ulcer of other part of right foot with Modifier: fat layer exp fat layer ex Quantity: 1 osed posed Electronic Signature(s) Signed: 01/02/2020 3:40:40 PM By: Lenda Kelp PA-C Entered By: Lenda Kelp on 01/02/2020 15:40:39

## 2020-01-03 MED ORDER — METOPROLOL TARTRATE 50 MG PO TABS: 50.0000 mg | ORAL_TABLET | Freq: Two times a day (BID) | ORAL | 0 refills | Status: DC

## 2020-01-03 MED ORDER — LEVOTHYROXINE SODIUM 50 MCG PO TABS: 50.0000 ug | ORAL_TABLET | Freq: Every day | ORAL | 0 refills | Status: DC

## 2020-01-03 NOTE — Telephone Encounter (Signed)
Rx sent 

## 2020-01-04 NOTE — Progress Notes (Signed)
GRAYLEN, NOBOA ([id number]) Visit Report for 01/02/2020 Arrival Information Details Patient Name: Date of Service: [name], [name] 01/02/2020 2:45 PM Medical Record [id number] Patient Account Number: [id number] Date of Birth/Sex: Treating RN: [birth year] (59 y.o. [name] Primary Care Josselyn Harkins: Billey Gosling Other Clinician: Referring Maleeyah Mccaughey: Treating Jessey Stehlin/Extender:Stone III, Elby Showers, Stacy Weeks in Treatment: 9 Visit Information History Since Last Visit Added or deleted any medications: No Patient Arrived: [name] Any new allergies or adverse reactions: No Arrival Time: 15:10 Had a fall or experienced change in No Accompanied By: self activities of daily living that may affect Transfer Assistance: None risk of falls: Patient Identification Verified: Yes Signs or symptoms of abuse/neglect since last No Secondary Verification Process Yes visito Completed: Hospitalized since last visit: No Patient Requires Transmission-Based No Implantable device outside of the clinic excluding No Precautions: cellular tissue based products placed in the center Patient Has Alerts: Yes since last visit: Patient Alerts: L ABI 1.07 Has Dressing in Place as Prescribed: Yes (04/2019) Pain Present Now: No R ABI 1.14 (04/2019) Electronic Signature(s) Signed: 01/02/2020 4:31:34 PM By: Deon Pilling Entered By: Deon Pilling on 01/02/2020 15:16:10 -------------------------------------------------------------------------------- Encounter Discharge Information Details Patient Name: Date of Service: DENSIL, OTTEY 01/02/2020 2:45 PM Medical Record [id number] Patient Account Number: [id number] Date of Birth/Sex: Treating RN: 03-Mar-1961 (58 y.o. [name] Primary Care Charlee Squibb: Billey Gosling Other Clinician: Referring Melani Brisbane: Treating Dream Nodal/Extender:Stone III, Elby Showers, Stacy Weeks in Treatment: 9 Encounter Discharge Information Items Post Procedure Vitals Discharge  Condition: Stable Temperature (F): 97.9 Ambulatory Status: Cane Pulse (bpm): 105 Discharge Destination: Home Respiratory Rate (breaths/min): 18 Transportation: Private Auto Blood Pressure (mmHg): 145/79 Accompanied By: self Schedule Follow-up Appointment: Yes Clinical Summary of Care: Patient Declined Electronic Signature(s) Signed: 01/04/2020 5:08:14 PM By: Carlene Coria RN Entered By: Carlene Coria on 01/02/2020 15:57:14 -------------------------------------------------------------------------------- Lower Extremity Assessment Details Patient Name: Date of Service: MAICOL, BOWLAND 01/02/2020 2:45 PM Medical Record [id number] Patient Account Number: [id number] Date of Birth/Sex: Treating RN: [birth year] (59 y.o. [name] Primary Care Ericah Scotto: Billey Gosling Other Clinician: Referring Kiana Hollar: Treating Dayquan Buys/Extender:Stone III, Elby Showers, Stacy Weeks in Treatment: 9 Edema Assessment Assessed: [Left: Yes] [Right: Yes] Edema: [Left: Yes] [Right: Yes] Calf Left: Right: Point of Measurement: 30 cm From Medial Instep 39.5 cm 41 cm Ankle Left: Right: Point of Measurement: 11 cm From Medial Instep 29.5 cm 30 cm Electronic Signature(s) Signed: 01/02/2020 4:31:34 PM By: Deon Pilling Entered By: Deon Pilling on 01/02/2020 15:20:37 -------------------------------------------------------------------------------- Multi-Disciplinary Care Plan Details Patient Name: Date of Service: EDAN, JUDAY 01/02/2020 2:45 PM Medical Record [id number] Patient Account Number: [id number] Date of Birth/Sex: Treating RN: 01-25-1961 (59 y.o. [name] Primary Care Janeane Cozart: Billey Gosling Other Clinician: Referring Nataliya Graig: Treating Masa Lubin/Extender:Stone III, Elby Showers, Stacy Weeks in Treatment: 9 Active Inactive Nutrition Nursing Diagnoses: Potential for alteratiion in Nutrition/Potential for imbalanced nutrition Goals: Patient/caregiver will maintain therapeutic  glucose control Date Initiated: 10/31/2019 Target Resolution Date: 01/23/2020 Goal Status: Active Interventions: Assess HgA1c results as ordered upon admission and as needed Provide education on elevated blood sugars and impact on wound healing Treatment Activities: Patient referred to Primary Care Physician for further nutritional evaluation : 10/31/2019 Notes: Wound/Skin Impairment Nursing Diagnoses: Impaired tissue integrity Knowledge deficit related to ulceration/compromised skin integrity Goals: Patient/caregiver will verbalize understanding of skin care regimen Date Initiated: 10/31/2019 Target Resolution Date: 01/23/2020 Goal Status: Active Ulcer/skin breakdown will have a volume reduction of 30% by week 4 Date Initiated: 10/31/2019 Date Inactivated: 11/28/2019 Target Resolution Date: 11/28/2019 Unmet Reason: left Goal Status:  Unmet improving, right unable to offload Ulcer/skin breakdown will have a volume reduction of 50% by week 8 Date Initiated: 11/28/2019 Date Inactivated: 12/26/2019 Target Resolution Date: 12/26/2019 Goal Status: Met Ulcer/skin breakdown will have a volume reduction of 80% by week 12 Date Initiated: 12/26/2019 Target Resolution Date: 01/23/2020 Goal Status: Active Interventions: Assess patient/caregiver ability to obtain necessary supplies Assess patient/caregiver ability to perform ulcer/skin care regimen upon admission and as needed Assess ulceration(s) every visit Provide education on ulcer and skin care Treatment Activities: Skin care regimen initiated : 10/31/2019 Topical wound management initiated : 10/31/2019 Notes: Electronic Signature(s) Signed: 01/02/2020 4:42:28 PM By: Baruch Gouty RN, BSN Entered By: Baruch Gouty on 01/02/2020 15:02:46 -------------------------------------------------------------------------------- Pain Assessment Details Patient Name: Date of Service: HANS, RUSHER 01/02/2020 2:45 PM Medical Record [id number] Patient  Account Number: [id number] Date of Birth/Sex: Treating RN: 12-26-1960 (59 y.o. [name] Primary Care Payslee Bateson: Billey Gosling Other Clinician: Referring Cassian Torelli: Treating Shoshanah Dapper/Extender:Stone III, Elby Showers, Stacy Weeks in Treatment: 9 Active Problems Location of Pain Severity and Description of Pain Patient Has Paino No Site Locations Rate the pain. Current Pain Level: 0 Pain Management and Medication Current Pain Management: Medication: No Cold Application: No Rest: No Massage: No Activity: No T.E.N.S.: No Heat Application: No Leg drop or elevation: No Is the Current Pain Management Adequate: Adequate How does your wound impact your activities of daily livingo Sleep: No Bathing: No Appetite: No Relationship With Others: No Bladder Continence: No Emotions: No Bowel Continence: No Work: No Toileting: No Drive: No Dressing: No Hobbies: No Electronic Signature(s) Signed: 01/02/2020 4:31:34 PM By: Deon Pilling Entered By: Deon Pilling on 01/02/2020 15:17:44 -------------------------------------------------------------------------------- Patient/Caregiver Education Details Patient Name: Date of Service: AVRY, ROEDL 3/17/2021andnbsp2:45 PM Medical Record [id number] Patient Account Number: [id number] Date of Birth/Gender: 09-20-1961 (58 y.o. M) Treating RN: Baruch Gouty Primary Care Physician: Billey Gosling Other Clinician: Referring Physician: Treating Physician/Extender:Stone III, Elby Showers, Nadine Counts in Treatment: 9 Education Assessment Education Provided To: Patient Education Topics Provided Elevated Blood Sugar/ Impact on Healing: Methods: Explain/Verbal Responses: Reinforcements needed, State content correctly Offloading: Methods: Explain/Verbal Responses: Reinforcements needed, State content correctly Wound/Skin Impairment: Methods: Explain/Verbal Responses: Reinforcements needed, State content correctly Electronic  Signature(s) Signed: 01/02/2020 4:42:28 PM By: Baruch Gouty RN, BSN Entered By: Baruch Gouty on 01/02/2020 15:03:48 -------------------------------------------------------------------------------- Wound Assessment Details Patient Name: Date of Service: ANDER, WAMSER 01/02/2020 2:45 PM Medical Record [id number] Patient Account Number: [id number] Date of Birth/Sex: Treating RN: [birth year] (59 y.o. [name] Primary Care Yasmina Chico: Billey Gosling Other Clinician: Referring Senia Even: Treating Anu Stagner/Extender:Stone III, Elby Showers, Stacy Weeks in Treatment: 9 Wound Status Wound Number: 1 Primary Diabetic Wound/Ulcer of the Lower Extremity Etiology: Wound Location: Left Foot - Plantar Wound Open Wounding Event: Gradually Appeared Status: Date Acquired: 02/16/2019 ComorbidAnemia, Sleep Apnea, Congestive Heart Weeks Of Treatment: 9 Weeks Of Treatment: 9 History: Failure, Hypertension, Peripheral Venous Clustered Wound: No Disease, Cirrhosis , Type II Diabetes, Osteoarthritis, Neuropathy Photos Photo Uploaded By: Mikeal Hawthorne on 01/03/2020 14:47:15 Wound Measurements Length: (cm) 1.9 Width: (cm) 1.8 Depth: (cm) 0.1 Area: (cm) 2.686 Volume: (cm) 0.269 Wound Description Classification: Grade 2 Wound Margin: Flat and Intact Exudate Amount: Medium Exudate Type: Serosanguineous Exudate Color: red, brown Wound Bed Granulation Amount: Large (67-100%) Granulation Quality: Red, Pink Necrotic Amount: None Present (0%) fter Cleansing: No ino No Exposed Structure sed: No Subcutaneous Tissue) Exposed: Yes sed: No sed: No ed: No d: No % Reduction in Area: 70% % Reduction in Volume: 95.7% Epithelialization: Small (1-33%) Tunneling: No Undermining:  No Foul Odor A Slough/Fibr Fascia Expo Fat Layer ( Tendon Expo Muscle Expo Joint Expos Bone Expose Assessment Notes callus periwound noted. Treatment Notes Wound #1 (Left, Plantar Foot) 1. Cleanse  With Wound Cleanser 3. Primary Dressing Applied Hydrofera Blue 4. Secondary Dressing Dry Gauze Roll Gauze 5. Secured With Recruitment consultant) Signed: 01/02/2020 4:31:34 PM By: Deon Pilling Entered By: Deon Pilling on 01/02/2020 15:26:09 -------------------------------------------------------------------------------- Wound Assessment Details Patient Name: Date of Service: ELFORD, EVILSIZER 01/02/2020 2:45 PM Medical Record [id number] Patient Account Number: [id number] Date of Birth/Sex: Treating RN: 1961/04/29 (59 y.o. [name] Primary Care Chitara Clonch: Billey Gosling Other Clinician: Referring Hazley Dezeeuw: Treating Brandonlee Navis/Extender:Stone III, Elby Showers, Stacy Weeks in Treatment: 9 Wound Status Wound Number: 2 Primary Diabetic Wound/Ulcer of the Lower Extremity Etiology: Wound Location: Right Metatarsal head second Wound Open Wounding Event: Gradually Appeared Status: Date Acquired: 02/16/2019 Comorbid Anemia, Sleep Apnea, Congestive Heart Weeks Of Treatment: 9 History: Failure, Hypertension, Peripheral Venous Clustered Wound: No Disease, Cirrhosis , Type II Diabetes, Osteoarthritis, Neuropathy Photos Photo Uploaded By: Mikeal Hawthorne on 01/03/2020 14:47:16 Wound Measurements Length: (cm) 2.4 Width: (cm) 1.8 Depth: (cm) 1.3 Area: (cm) 3.393 Volume: (cm) 4.411 Wound Description Classification: Grade 2 Wound Margin: Flat and Intact Exudate Amount: Medium Exudate Type: Serosanguineous Exudate Color: red, brown Wound Bed Granulation Amount: Large (67-100%) Granulation Quality: Pink Necrotic Amount: None Present (0%) Treatment Notes Wound #2 (Right Metatarsal head second) 1. Cleanse With Wound Cleanser 3. Primary Dressing Applied Hydrofera Blue 4. Secondary Dressing Dry Gauze Roll Gauze 5. Secured With Tape fter Cleansing: No ino No Exposed Structure ed: No ubcutaneous Tissue) Exposed: Yes ed: No ed: No d: No : No % Reduction in Area:  -54.3% % Reduction in Volume: -67.1% Epithelialization: Small (1-33%) Tunneling: No Undermining: No Foul Odor A Slough/Fibr Fascia Expos Fat Layer (S Tendon Expos Muscle Expos Joint Expose Bone Exposed Electronic Signature(s) Signed: 01/02/2020 4:31:34 PM By: Deon Pilling Entered By: Deon Pilling on 01/02/2020 15:25:05 -------------------------------------------------------------------------------- Vitals Details Patient Name: Date of Service: CLINE, DRAHEIM 01/02/2020 2:45 PM Medical Record [id number] Patient Account Number: [id number] Date of Birth/Sex: Treating RN: August 02, 1961 (59 y.o. [name] Primary Care Xayvier Vallez: Billey Gosling Other Clinician: Referring Jerrard Bradburn: Treating Chana Lindstrom/Extender:Stone III, Elby Showers, Stacy Weeks in Treatment: 9 Vital Signs Time Taken: 15:15 Temperature (F): 97.9 Height (in): 73 Pulse (bpm): 105 Weight (lbs): 260 Respiratory Rate (breaths/min): 18 Body Mass Index (BMI): 34.3 Blood Pressure (mmHg): 145/79 Reference Range: 80 - 120 mg / dl Electronic Signature(s) Signed: 01/02/2020 4:31:34 PM By: Deon Pilling Entered By: Deon Pilling on 01/02/2020 15:17:32

## 2020-01-07 ENCOUNTER — Ambulatory Visit (HOSPITAL_COMMUNITY): Payer: Medicare (Managed Care)

## 2020-01-07 ENCOUNTER — Encounter (HOSPITAL_COMMUNITY): Payer: Self-pay

## 2020-01-07 ENCOUNTER — Encounter: Payer: Self-pay | Admitting: Internal Medicine

## 2020-01-09 ENCOUNTER — Other Ambulatory Visit: Payer: Self-pay

## 2020-01-09 ENCOUNTER — Encounter (HOSPITAL_BASED_OUTPATIENT_CLINIC_OR_DEPARTMENT_OTHER): Payer: Medicare (Managed Care) | Admitting: Physician Assistant

## 2020-01-09 DIAGNOSIS — E11621 Type 2 diabetes mellitus with foot ulcer: Secondary | ICD-10-CM | POA: Diagnosis not present

## 2020-01-10 NOTE — Progress Notes (Addendum)
TAYON, PAREKH ([id number]) Visit Report for 01/09/2020 Chief Complaint Document Details Patient Name: Date of Service: [name], [name] 01/09/2020 1:00 PM Medical Record [id number] Patient Account Number: [id number] Date of Birth/Sex: Treating RN: 10-Jul-1961 (59 y.o. [name] Primary Care Provider: [name] Other Clinician: Referring Provider: Treating Provider/Extender:Stone III, Dayton Martes, Stacy Weeks in Treatment: 10 Information Obtained from: Patient Chief Complaint Bilateral foot diabetic ulcers Electronic Signature(s) Signed: 01/09/2020 2:04:16 PM By: Lenda Kelp PA-C Entered By: Lenda Kelp on 01/09/2020 14:04:15 -------------------------------------------------------------------------------- Problem List Details Patient Name: Date of Service: KURT, HOFFMEIER 01/09/2020 1:00 PM Medical Record [id number] Patient Account Number: [id number] Date of Birth/Sex: Treating RN: [birth year] (59 y.o. 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

## 2020-01-16 ENCOUNTER — Encounter (HOSPITAL_BASED_OUTPATIENT_CLINIC_OR_DEPARTMENT_OTHER): Payer: Medicare (Managed Care) | Admitting: Physician Assistant

## 2020-01-16 ENCOUNTER — Other Ambulatory Visit: Payer: Self-pay

## 2020-01-16 DIAGNOSIS — E11621 Type 2 diabetes mellitus with foot ulcer: Secondary | ICD-10-CM | POA: Diagnosis not present

## 2020-01-16 NOTE — Progress Notes (Signed)
[name], [name] ([id number]) Visit Report for 01/16/2020 Arrival Information Details Patient Name: Date of Service: [name], [name] 01/16/2020 1:00 PM Medical Record Number:[id number] Patient Account Number: [id number] Date of Birth/Sex: Treating RN: 02/28/1961 (58 y.o. M) Epps, Carrie Primary Care Provider: Burns, Stacy Other Clinician: Referring Provider: Treating Provider/Extender:Stone III, Hoyt Burns, Stacy Weeks in Treatment: 11 Visit Information History Since Last Visit All ordered tests and consults were completed: No Patient Arrived: Ambulatory Added or deleted any medications: No Arrival Time: 13:23 Any new allergies or adverse reactions: No Accompanied By: self Had a fall or experienced change in No Transfer Assistance: None activities of daily living that may affect Patient Identification Verified: Yes risk of falls: Secondary Verification Process Yes Signs or symptoms of abuse/neglect since last No Completed: visito Patient Requires Transmission-Based No Hospitalized since last visit: No Precautions: Implantable device outside of the clinic excluding No Patient Has Alerts: Yes cellular tissue based products placed in the center Patient Alerts: L ABI 1.07 since last visit: (04/2019) Has Dressing in Place as Prescribed: Yes R ABI 1.14 Has Compression in Place as Prescribed: Yes (04/2019) Pain Present Now: No Electronic Signature(s) Signed: 01/16/2020 5:40:01 PM By: Epps, Carrie RN Entered By: Epps, Carrie on 01/16/2020 13:24:12 -------------------------------------------------------------------------------- Encounter Discharge Information Details Patient Name: Date of Service: [name], [name] 01/16/2020 1:00 PM Medical Record Number:[id number] Patient Account Number: [id number] Date of Birth/Sex: Treating RN: 07/24/1961 (58 y.o. M) Epps, Carrie Primary Care Provider: Burns, Stacy Other Clinician: Referring Provider: Treating Provider/Extender:Stone III, Hoyt Burns,  Stacy Weeks in Treatment: 11 Encounter Discharge Information Items Post Procedure Vitals Discharge Condition: Stable Temperature (F): 98.6 Ambulatory Status: Ambulatory Pulse (bpm): 101 Discharge Destination: Home Respiratory Rate (breaths/min): 18 Transportation: Private Auto Blood Pressure (mmHg): 153/84 Accompanied By: self Schedule Follow-up Appointment: Yes Clinical Summary of Care: Patient Declined Electronic Signature(s) Signed: 01/16/2020 5:40:01 PM By: Epps, Carrie RN Entered By: Epps, Carrie on 01/16/2020 14:42:17 -------------------------------------------------------------------------------- Lower Extremity Assessment Details Patient Name: Date of Service: [name], [name] 01/16/2020 1:00 PM Medical Record Number:[id number] Patient Account Number: [id number] Date of Birth/Sex: Treating RN: 03/08/1961 (58 y.o. M) Epps, Carrie Primary Care Provider: Burns, Stacy Other Clinician: Referring Provider: Treating Provider/Extender:Stone III, Hoyt Burns, Stacy Weeks in Treatment: 11 Edema Assessment Assessed: [Left: No] [Right: No] Edema: [Left: Yes] [Right: Yes] Calf Left: Right: Point of Measurement: 30 cm From Medial Instep 42.5 cm 44 cm Ankle Left: Right: Point of Measurement: 11 cm From Medial Instep 29.5 cm 32 cm Electronic Signature(s) Signed: 01/16/2020 5:40:01 PM By: Epps, Carrie RN Entered By: Epps, Carrie on 01/16/2020 13:25:03 -------------------------------------------------------------------------------- Multi-Disciplinary Care Plan Details Patient Name: Date of Service: [name], [name] 01/16/2020 1:00 PM Medical Record Number:[id number] Patient Account Number: [id number] Date of Birth/Sex: Treating RN: 08/05/1961 (58 y.o. M) Boehlein, Linda Primary Care Provider: Burns, Stacy Other Clinician: Referring Provider: Treating Provider/Extender:Stone III, Hoyt Burns, Stacy Weeks in Treatment: 11 Active Inactive Nutrition Nursing Diagnoses: Potential for  alteratiion in Nutrition/Potential for imbalanced nutrition Goals: Patient/caregiver will maintain therapeutic glucose control Date Initiated: 10/31/2019 Target Resolution Date: 01/23/2020 Goal Status: Active Interventions: Assess HgA1c results as ordered upon admission and as needed Provide education on elevated blood sugars and impact on wound healing Treatment Activities: Patient referred to Primary Care Physician for further nutritional evaluation : 10/31/2019 Notes: Wound/Skin Impairment Nursing Diagnoses: Impaired tissue integrity Knowledge deficit related to ulceration/compromised skin integrity Goals: Patient/caregiver will verbalize understanding of skin care regimen Date Initiated: 10/31/2019 Target Resolution Date: 01/23/2020 Goal Status: Active Ulcer/skin breakdown will have a volume reduction of 30% by   week 4 Date Initiated: 10/31/2019 Date Inactivated: 11/28/2019 Target Resolution Date: 11/28/2019 Unmet Reason: left Goal Status: Unmet improving, right unable to offload Ulcer/skin breakdown will have a volume reduction of 50% by week 8 Date Initiated: 11/28/2019 Date Inactivated: 12/26/2019 Target Resolution Date: 12/26/2019 Goal Status: Met Ulcer/skin breakdown will have a volume reduction of 80% by week 12 Date Initiated: 12/26/2019 Target Resolution Date: 01/23/2020 Goal Status: Active Interventions: Assess patient/caregiver ability to obtain necessary supplies Assess patient/caregiver ability to perform ulcer/skin care regimen upon admission and as needed Assess ulceration(s) every visit Provide education on ulcer and skin care Treatment Activities: Skin care regimen initiated : 10/31/2019 Topical wound management initiated : 10/31/2019 Notes: Electronic Signature(s) Signed: 01/16/2020 6:50:57 PM By: Baruch Gouty RN, BSN Entered By: Baruch Gouty on 01/16/2020 14:10:17 -------------------------------------------------------------------------------- Pain Assessment  Details Patient Name: Date of Service: [name], [name] 01/16/2020 1:00 PM Medical Record [id number] Patient Account Number: [id number] Date of Birth/Sex: Treating RN: [birth year] (58 y.o. [name] Primary Care Nani Ingram: Billey Gosling Other Clinician: Referring Karlon Schlafer: Treating Demaree Liberto/Extender:Stone III, Elby Showers, Stacy Weeks in Treatment: 11 Active Problems Location of Pain Severity and Description of Pain Patient Has Paino No Site Locations Pain Management and Medication Current Pain Management: Electronic Signature(s) Signed: 01/16/2020 5:40:01 PM By: Carlene Coria RN Entered By: Carlene Coria on 01/16/2020 13:24:55 -------------------------------------------------------------------------------- Patient/Caregiver Education Details Patient Name: Date of Service: [name], [name] 3/31/2021andnbsp1:00 PM Medical Record [id number] Patient Account Number: [id number] Date of Birth/Gender: 13-May-1961 (58 y.o. M) Treating RN: Baruch Gouty Primary Care Physician: Billey Gosling Other Clinician: Referring Physician: Treating Physician/Extender:Stone III, Elby Showers, Stacy Weeks in Treatment: 11 Education Assessment Education Provided To: Patient Education Topics Provided Elevated Blood Sugar/ Impact on Healing: Methods: Explain/Verbal Responses: Reinforcements needed, State content correctly Wound/Skin Impairment: Methods: Explain/Verbal Responses: Reinforcements needed, State content correctly Electronic Signature(s) Signed: 01/16/2020 6:50:57 PM By: Baruch Gouty RN, BSN Entered By: Baruch Gouty on 01/16/2020 14:10:38 -------------------------------------------------------------------------------- Wound Assessment Details Patient Name: Date of Service: [name], [name] 01/16/2020 1:00 PM Medical Record [id number] Patient Account Number: [id number] Date of Birth/Sex: Treating RN: 1961/03/23 (58 y.o. Jerilynn Mages) Carlene Coria Primary Care Meleane Selinger: Billey Gosling Other Clinician: Referring Madgie Dhaliwal: Treating Tarisha Fader/Extender:Stone III, Elby Showers, Stacy Weeks in Treatment: 11 Wound Status Wound Number: 1 Primary Diabetic Wound/Ulcer of the Lower Extremity Etiology: Wound Location: Left Foot - Plantar Wound Open Wounding Event: Gradually Appeared Status: Date Acquired: 02/16/2019 Comorbid Anemia, Sleep Apnea, Congestive Heart Weeks Of Treatment: 11 History: Failure, Hypertension, Peripheral Venous Clustered Wound: No Disease, Cirrhosis , Type II Diabetes, Osteoarthritis, Neuropathy Wound Measurements Length: (cm) 1.6 Width: (cm) 2 Depth: (cm) 0.1 Area: (cm) 2.513 Volume: (cm) 0.251 Wound Description Classification: Grade 2 Wound Margin: Flat and Intact Exudate Amount: Medium Exudate Type: Serosanguineous Exudate Color: red, brown Wound Bed Granulation Amount: Large (67-100%) Granulation Quality: Pink Necrotic Amount: None Present (0%) After Cleansing: No brino No Exposed Structure ed: No ubcutaneous Tissue) Exposed: Yes ed: No ed: No d: No : No % Reduction in Area: 71.9% % Reduction in Volume: 96% Epithelialization: Small (1-33%) Tunneling: No Undermining: No Foul Odor Slough/Fi Fascia Expos Fat Layer (S Tendon Expos Muscle Expos Joint Expose Bone Exposed Treatment Notes Wound #1 (Left, Plantar Foot) 1. Cleanse With Wound Cleanser Soap and water 3. Primary Dressing Applied Endoform 4. Secondary Dressing Dry Gauze Roll Gauze 5. Secured With Tape Notes normal saline Electronic Signature(s) Signed: 01/16/2020 5:40:01 PM By: Carlene Coria RN Entered By: Carlene Coria on 01/16/2020 13:28:10 -------------------------------------------------------------------------------- Wound Assessment Details Patient Name: Date of Service:  [name], [name] 01/16/2020 1:00 PM Medical Record [id number] Patient Account Number: [id number] Date of Birth/Sex: Treating RN: March 21, 1961 (58 y.o. Jerilynn Mages) Carlene Coria Primary  Care Jacquees Gongora: Billey Gosling Other Clinician: Referring Naelani Lafrance: Treating Latravious Levitt/Extender:Stone III, Elby Showers, Stacy Weeks in Treatment: 11 Wound Status Wound Number: 2 Primary Diabetic Wound/Ulcer of the Lower Extremity Etiology: Wound Location: Right Metatarsal head second Wound Open Wounding Event: Gradually Appeared Status: Date Acquired: 02/16/2019 Comorbid Anemia, Sleep Apnea, Congestive Heart Weeks Of Treatment: 11 History: Failure, Hypertension, Peripheral Venous Clustered Wound: No Disease, Cirrhosis , Type II Diabetes, Osteoarthritis, Neuropathy Wound Measurements Length: (cm) 2.8 Width: (cm) 1.8 Depth: (cm) 1.7 Area: (cm) 3.958 Volume: (cm) 6.729 Wound Description Classification: Grade 2 Wound Margin: Flat and Intact Exudate Amount: Medium Exudate Type: Serosanguineous Exudate Color: red, brown Wound Bed Granulation Amount: Large (67-100%) Granulation Quality: Pink Necrotic Amount: Small (1-33%) Necrotic Quality: Adherent Slough fter Cleansing: No ino No Exposed Structure ed: No ubcutaneous Tissue) Exposed: Yes ed: No ed: No d: No : No % Reduction in Area: -80% % Reduction in Volume: -155% Epithelialization: Small (1-33%) Tunneling: No Undermining: No Foul Odor A Slough/Fibr Fascia Expos Fat Layer (S Tendon Expos Muscle Expos Joint Expose Bone Exposed Treatment Notes Wound #2 (Right Metatarsal head second) 1. Cleanse With Wound Cleanser Soap and water 3. Primary Dressing Applied Endoform 4. Secondary Dressing Dry Gauze Roll Gauze 5. Secured With Tape Notes normal saline Electronic Signature(s) Signed: 01/16/2020 5:40:01 PM By: Carlene Coria RN Entered By: Carlene Coria on 01/16/2020 13:28:25 -------------------------------------------------------------------------------- Vitals Details Patient Name: Date of Service: [name], [name] 01/16/2020 1:00 PM Medical Record [id number] Patient Account Number: [id number] Date of  Birth/Sex: Treating RN: 09-16-1961 (58 y.o. Jerilynn Mages) Carlene Coria Primary Care Ardie Dragoo: Billey Gosling Other Clinician: Referring Luisana Lutzke: Treating Jaquawn Saffran/Extender:Stone III, Elby Showers, Stacy Weeks in Treatment: 11 Vital Signs Time Taken: 13:24 Temperature (F): 98.6 Height (in): 73 Pulse (bpm): 101 Weight (lbs): 260 Respiratory Rate (breaths/min): 18 Body Mass Index (BMI): 34.3 Blood Pressure (mmHg): 153/84 Reference Range: 80 - 120 mg / dl Electronic Signature(s) Signed: 01/16/2020 5:40:01 PM By: Carlene Coria RN Entered By: Carlene Coria on 01/16/2020 13:24:44

## 2020-01-16 NOTE — Progress Notes (Addendum)
[name], [name] ([id number]) Visit Report for 01/16/2020 Chief Complaint Document Details Patient Name: Date of Service: [name], [name] 01/16/2020 1:00 PM Medical Record [id number] Patient Account Number: [id number] Date of Birth/Sex: Treating RN: 03-Oct-1961 (59 y.o. [name] Primary Care Provider: Cheryll [name] Other Clinician: Referring Provider: Treating Provider/Extender:[name] in Treatment: 11 Information Obtained from: Patient Chief Complaint Bilateral foot diabetic ulcers Electronic Signature(s) Signed: 01/16/2020 1:20:20 PM By: [name] Kelp PA-C Entered By: [name] on 01/16/2020 13:20:20 -------------------------------------------------------------------------------- Debridement Details Patient Name: Date of Service: [name], [name] 01/16/2020 1:00 PM Medical Record [id number] Patient Account Number: [id number] Date of Birth/Sex: Treating RN: 08-07-1961 (59 y.o. [name] Primary Care Provider: Cheryll [name] Other Clinician: Referring Provider: Treating Provider/Extender:[name] in Treatment: 11 Debridement Performed for Wound #2 Right Metatarsal head second Assessment: Performed By: Physician [name] Kelp, PA Debridement Type: Debridement Severity of Tissue Pre Fat layer exposed Debridement: Level of Consciousness (Pre- Awake and Alert procedure): Pre-procedure Verification/Time Out Taken: Yes - 14:10 Start Time: 14:12 Pain Control: Other : benzocaine 20% spray Total Area Debrided (L x W): 3 (cm) x 2 (cm) = 6 (cm) Tissue and other material Viable, Non-Viable, Callus, Slough, Subcutaneous, Skin: Epidermis, Slough debrided: Level: Skin/Subcutaneous Tissue Debridement Description: Excisional Instrument: Curette Bleeding: Minimum Hemostasis Achieved: Pressure End Time: 14:16 Procedural Pain: 0 Post Procedural Pain: 0 Response to Treatment: Procedure was tolerated  well Level of Consciousness Awake and Alert (Post-procedure): Post Debridement Measurements of Total Wound Length: (cm) 2.8 Width: (cm) 1.8 Depth: (cm) 1.7 Volume: (cm) 6.729 Character of Wound/Ulcer Post Improved Debridement: Severity of Tissue Post Debridement: Fat layer exposed Post Procedure Diagnosis Same as Pre-procedure Electronic Signature(s) Signed: 01/16/2020 6:50:57 PM By: [name] Deed RN, BSN Signed: 01/16/2020 10:08:08 PM By: [name] Kelp PA-C Entered By: [name] on 01/16/2020 14:13:57 -------------------------------------------------------------------------------- HPI Details Patient Name: Date of Service: [name], [name] 01/16/2020 1:00 PM Medical Record [id number] Patient Account Number: [id number] Date of Birth/Sex: Treating RN: 1960-12-09 (59 y.o. [name] Primary Care Provider: Cheryll [name] Other Clinician: Referring Provider: Treating Provider/Extender:[name] in Treatment: 11 History of Present Illness HPI Description: 10/31/2019 upon evaluation today patient appears to be doing somewhat poorly in regard to his bilateral plantar feet. He has wounds that he tells me have been present since 2012 intermittently off and on. Most recently this has been open for at least the past 6 months to a year. He has been trying to treat this in different ways using Santyl along with various other dressings including Medihoney and even at one point Xeroform. Nothing really has seem to get this completely closed. He was recently in the hospital for cellulitis of his leg subsequently he did have x-rays as well as MRIs that showed negative for any signs of osteomyelitis in regard to the wounds on his feet. Fortunately there is no signs of systemic infection at this time. No fevers, chills, nausea, vomiting, or diarrhea. Patient has previously used Darco offloading shoes as far as frontal floaters as well as postop  surgical shoes. He has never been in a total contact cast that may be something we need to strongly consider here. Patient's most recent hemoglobin A1c 1 month ago was 5.3 seems to be very well controlled which is great. Subsequently he has seen vascular as well as podiatry. His ABIs are 1.07 on the left and 1.14 on the right he seems to be doing well he does  have chronic venous stasis. 11/07/2019 upon evaluation today patient appears to be doing well with regard to his wounds all things considered. I do not see any severe worsening he still has some callus buildup on the right more than the left he notes that he has been probably more active than he should as far as walking is concerned is just very hard to not be active. He knows he needs to be more careful in this regard however. He is willing to give the cast a try at this point although he notes that he is a little nervous about this just with regard to balance although he will be very careful and obviously if he has any trouble he knows to contact the office and let me know. 1/22; patient is in for his obligatory first total contact cast change. Our intake nurse reported a very large amount of drainage which is spelled out over to the surrounding skin. Has bilateral diabetic foot wounds. He has Charcot feet. We have been using silver alginate on his wounds. 11/14/2019 on evaluation today patient is actually seeming to make good improvement in regard to his bilateral plantar foot wounds. We have been using a cast on the left side and on the right side he has been using dressings he is changing up his own accord. With that being said he tells me that he is also not walking as much just due to how unsteady he feels. He takes it easy when he does have to walk and when he does not have to walk he is resting. This is probably help in his right foot as well has the left foot which is actually measuring better. In fact both are measuring better.  Overall I am very pleased with how things seem to be progressing. The patient does have some odor on the left foot this does have me concerned about the possibility of infection, and actually probably go ahead and put him on antibiotics today as well as utilizing a continuation of the cast on the left foot I think that will be fine we probably just need to bring him in sooner to change this not last a whole week. 1/29; we brought the patient back today for a total contact cast change on the left out of concern for excessive drainage. We are using drawtex over the wound as the primary dressing 11/21/2019 on evaluation today patient appears to be doing well with regard to his left plantar foot. In fact both foot ulcers actually seem to be doing pretty well. Nonetheless he is having a lot of drainage on the left at this time and again we did obtain a wound culture did show positive for Staph aureus that was reviewed by myself today as well. Nonetheless he is on Bactrim which was shown to be sensitive that should be helping in this regard. Fortunately there is no signs of infection systemically at this point. 2/5; back in clinic today for a total contact cast change apparently secondary to very significant drainage. Still using drawtex 11/28/2019 upon evaluation today patient appears to be doing well with regard to his wounds. The right foot is doing okay as measured about the same in my opinion. The left foot is actually showing signs of significant improvement is measuring smaller there is a lot of hyper granulation likely due to the continued drainage at this point. We did obtain approval for a snap VAC I think that is good to be appropriate for him and will  likely help this tremendously underneath the cast. He is definitely in agreement with proceeding with such. 2/12; patient came in today after his snap VAC lost suction. Brought in to see one of our nurses. The dressing was replaced and then we put  the cast back on and rehooked up the snap VAC. Apparently his wound looked very good per our intake nurse. 2/15; again we replaced the cast on Friday. By Saturday the snap VAC and light suction. He called this morning he comes in acutely. The wounds look fine however the VAC is not functioning. We replaced the cast using silver alginate as the primary dressing backed with Kerramax. The snap VAC was not replaced 12/05/2019 upon evaluation today patient appears to be doing better in regard to his left plantar foot ulcer. Fortunately there is no signs of active infection at this time. Unfortunately he is continuing to have issues with the right foot he is really not making any progress here things seem to be somewhat stagnant to be honest. The depth has increased but that is due to me having debrided the wound in the past based on what I am seeing. 12/12/2019 on evaluation today patient appears to be doing more poorly in regard to the left lower extremity. He has some erythema spreading up the side of his foot I am concerned about infection again at this point. Unfortunately he has been seeing improvement with a total contact cast but I do not think we should put that on today. On his right plantar foot he continues to have significant drainage this is actually measuring deeper I really do not feel like you are making any progress whatsoever. I have prescribed Granix for him unfortunately his insurance apparently was going to cost him a $500 co-pay. 12/19/2019 upon evaluation today patient actually appears to be doing better in regard to both wounds. With that being said he actually did get the reGranix which he had to pay $500 for. With that being said it does look like that he is actually made some improvement based on what I am seeing at this point with the reGranix. Obviously if he is going to continue this we are going to do something about trying to get him some help in covering the cost. 12/26/2019  on evaluation today patient appears to be doing really much better even compared to last week. Overall the wound seems to be much better even compared to last week and last week was better than the week before. Since has been using the reGranix his symptoms have improved significantly. With that being said the issue right now is simply that this is a very expensive medication for him the first dose cost him $500. Upon inspection patient's wound bed actually is however dramatically improved compared to before he started this 2 [name] ago. 01/02/2020 upon evaluation today patient appears to actually be doing well. He still had a little bit of reGranix left that has been using in small amounts he just been applying it every other day instead of every day in order to make it go longer. Overall we are still seeing excellent improvement he is measuring smaller looking better healthier tissue and everything seems to be pointing to this headed in the right direction. Fortunately there is no evidence of infection either which is also excellent news. He does have his MRI coming up within the next week. 01/09/2020 upon evaluation today patient appears to be doing a little worse today compared to previous week's evaluation.  He is actually been out of the reGranix at this point. He has been trying to make the stretch out so he has been changing the dressings on a regular set schedule like he was previous. I think this has made a difference. Fortunately there is no signs of active infection at this time. No fevers, chills, nausea, vomiting, or diarrhea. 01/16/2020 upon evaluation today patient appears to be doing well with regard to his left plantar foot ulcer. The right plantar foot still shows some significant depth at this point. Fortunately there is no signs of active infection at this time. Electronic Signature(s) Signed: 01/16/2020 2:20:49 PM By: [name] Kelp PA-C Entered By: [name] on 01/16/2020  14:20:48 -------------------------------------------------------------------------------- Physical Exam Details Patient Name: Date of Service: [name], [name] 01/16/2020 1:00 PM Medical Record [id number] Patient Account Number: [id number] Date of Birth/Sex: Treating RN: 12-29-1960 (59 y.o. [name] Primary Care Provider: Cheryll [name] Other Clinician: Referring Provider: Treating Provider/Extender:[name] in Treatment: 11 Constitutional Well-nourished and well-hydrated in no acute distress. Respiratory normal breathing without difficulty. Psychiatric this patient is able to make decisions and demonstrates good insight into disease process. Alert and Oriented x 3. pleasant and cooperative. Notes Patient's wound bed currently showed signs of good granulation there does not appear to be any signs of active infection which is good news. I did have to perform some sharp debridement on the right plantar foot as there was some hyper granular and poorly granulated tissue. He did have some bleeding chemical cauterization with silver nitrate was used to stop bleeding post debridement. Electronic Signature(s) Signed: 01/16/2020 2:21:13 PM By: [name] Kelp PA-C Entered By: [name] on 01/16/2020 14:21:12 -------------------------------------------------------------------------------- Physician Orders Details Patient Name: Date of Service: [name], [name] 01/16/2020 1:00 PM Medical Record [id number] Patient Account Number: [id number] Date of Birth/Sex: Treating RN: [birth year] (59 y.o. [name] Primary Care Provider: Cheryll [name] Other Clinician: Referring Provider: Treating Provider/Extender:[name] III, Dayton Martes, Duanne Limerick in Treatment: 67 Verbal / Phone Orders: No Diagnosis Coding ICD-10 Coding Code Description E11.622 Type 2 diabetes mellitus with other skin ulcer L97.522 Non-pressure chronic ulcer of other part of left  foot with fat layer exposed L97.512 Non-pressure chronic ulcer of other part of right foot with fat layer exposed I10 Essential (primary) hypertension I50.42 Chronic combined systolic (congestive) and diastolic (congestive) heart failure I87.2 Venous insufficiency (chronic) (peripheral) L84 Corns and callosities Follow-up Appointments Return Appointment in 1 week. Dressing Change Frequency Wound #1 Left,Plantar Foot Change Dressing every other day. Wound #2 Right Metatarsal head second Change Dressing every other day. Wound Cleansing Wound #1 Left,Plantar Foot May shower and wash wound with soap and water. Wound #2 Right Metatarsal head second May shower and wash wound with soap and water. Primary Wound Dressing Wound #1 Left,Plantar Foot Endoform - moisten with saline Wound #2 Right Metatarsal head second Endoform - moisten with saline Secondary Dressing Wound #1 Left,Plantar Foot Kerlix/Rolled Gauze Dry Gauze ABD pad Wound #2 Right Metatarsal head second Kerlix/Rolled Gauze Dry Gauze Drawtex - cut to fit inside wound margins to hold endoform in place (may need 2 layers to fill space) Other: - felt callous pad Edema Control Patient to wear own compression stockings - daily to both legs Avoid standing for long periods of time Elevate legs to the level of the heart or above for 30 minutes daily and/or when sitting, a frequency of: Off-Loading Open toe surgical shoe to: - both feet Patient Medications Allergies: Claritin Notifications  Medication Indication Start End Regranex 01/25/2020 DOSE topical 0.01 % gel - gel topical applied to the wound bed of each of your feet daily as directed in the clinic under your dressings Electronic Signature(s) Signed: 01/25/2020 4:17:54 PM By: [name] KelpStone III, Makayla Lanter PA-C Previous Signature: 01/16/2020 6:50:57 PM Version By: [name] DeedBoehlein, Linda RN, BSN Previous Signature: 01/16/2020 10:08:08 PM Version By: [name] KelpStone III, Devinne Epstein PA-C Entered By: [name] KelpStone III,  Alby Schwabe on 01/25/2020 16:17:53 -------------------------------------------------------------------------------- Problem List Details Patient Name: Date of Service: [name] BienMILLER, [name] 01/16/2020 1:00 PM Medical Record [id number] Patient Account Number: [id number] Date of Birth/Sex: Treating RN: 03/16/1961 (59 y.o. [name] SchoonerM) Boehlein, Linda Primary Care Provider: Cheryll CockayneBurns, Stacy Other Clinician: Referring Provider: Treating Provider/Extender:[name] III, Dayton MartesHoyt Burns, Stacy [name] in Treatment: 11 Active Problems ICD-10 Evaluated Encounter Code Description Active Date Today Diagnosis E11.622 Type 2 diabetes mellitus with other skin ulcer 10/31/2019 No Yes L97.522 Non-pressure chronic ulcer of other part of left foot 10/31/2019 No Yes with fat layer exposed L97.512 Non-pressure chronic ulcer of other part of right foot 10/31/2019 No Yes with fat layer exposed I10 Essential (primary) hypertension 10/31/2019 No Yes I50.42 Chronic combined systolic (congestive) and diastolic 10/31/2019 No Yes (congestive) heart failure I87.2 Venous insufficiency (chronic) (peripheral) 10/31/2019 No Yes L84 Corns and callosities 11/14/2019 No Yes Inactive Problems Resolved Problems Electronic Signature(s) Signed: 01/16/2020 1:20:06 PM By: [name] KelpStone III, Vivion Romano PA-C Entered By: [name] KelpStone III, Akeiba Axelson on 01/16/2020 13:20:05 -------------------------------------------------------------------------------- Progress Note Details Patient Name: Date of Service: [name] BienMILLER, Adar 01/16/2020 1:00 PM Medical Record [id number] Patient Account Number: [id number] Date of Birth/Sex: Treating RN: 05/30/1961 (59 y.o. [name] SchoonerM) Boehlein, Linda Primary Care Provider: Cheryll CockayneBurns, Stacy Other Clinician: Referring Provider: Treating Provider/Extender:[name] III, Dayton MartesHoyt Burns, Stacy [name] in Treatment: 11 Subjective Chief Complaint Information obtained from Patient Bilateral foot diabetic ulcers History of Present Illness (HPI) 10/31/2019 upon evaluation today  patient appears to be doing somewhat poorly in regard to his bilateral plantar feet. He has wounds that he tells me have been present since 2012 intermittently off and on. Most recently this has been open for at least the past 6 months to a year. He has been trying to treat this in different ways using Santyl along with various other dressings including Medihoney and even at one point Xeroform. Nothing really has seem to get this completely closed. He was recently in the hospital for cellulitis of his leg subsequently he did have x-rays as well as MRIs that showed negative for any signs of osteomyelitis in regard to the wounds on his feet. Fortunately there is no signs of systemic infection at this time. No fevers, chills, nausea, vomiting, or diarrhea. Patient has previously used Darco offloading shoes as far as frontal floaters as well as postop surgical shoes. He has never been in a total contact cast that may be something we need to strongly consider here. Patient's most recent hemoglobin A1c 1 month ago was 5.3 seems to be very well controlled which is great. Subsequently he has seen vascular as well as podiatry. His ABIs are 1.07 on the left and 1.14 on the right he seems to be doing well he does have chronic venous stasis. 11/07/2019 upon evaluation today patient appears to be doing well with regard to his wounds all things considered. I do not see any severe worsening he still has some callus buildup on the right more than the left he notes that he has been probably more active than he should as far as walking is concerned is just very hard to not be active.  He knows he needs to be more careful in this regard however. He is willing to give the cast a try at this point although he notes that he is a little nervous about this just with regard to balance although he will be very careful and obviously if he has any trouble he knows to contact the office and let me know. 1/22; patient is in for  his obligatory first total contact cast change. Our intake nurse reported a very large amount of drainage which is spelled out over to the surrounding skin. Has bilateral diabetic foot wounds. He has Charcot feet. We have been using silver alginate on his wounds. 11/14/2019 on evaluation today patient is actually seeming to make good improvement in regard to his bilateral plantar foot wounds. We have been using a cast on the left side and on the right side he has been using dressings he is changing up his own accord. With that being said he tells me that he is also not walking as much just due to how unsteady he feels. He takes it easy when he does have to walk and when he does not have to walk he is resting. This is probably help in his right foot as well has the left foot which is actually measuring better. In fact both are measuring better. Overall I am very pleased with how things seem to be progressing. The patient does have some odor on the left foot this does have me concerned about the possibility of infection, and actually probably go ahead and put him on antibiotics today as well as utilizing a continuation of the cast on the left foot I think that will be fine we probably just need to bring him in sooner to change this not last a whole week. 1/29; we brought the patient back today for a total contact cast change on the left out of concern for excessive drainage. We are using drawtex over the wound as the primary dressing 11/21/2019 on evaluation today patient appears to be doing well with regard to his left plantar foot. In fact both foot ulcers actually seem to be doing pretty well. Nonetheless he is having a lot of drainage on the left at this time and again we did obtain a wound culture did show positive for Staph aureus that was reviewed by myself today as well. Nonetheless he is on Bactrim which was shown to be sensitive that should be helping in this regard. Fortunately there is no  signs of infection systemically at this point. 2/5; back in clinic today for a total contact cast change apparently secondary to very significant drainage. Still using drawtex 11/28/2019 upon evaluation today patient appears to be doing well with regard to his wounds. The right foot is doing okay as measured about the same in my opinion. The left foot is actually showing signs of significant improvement is measuring smaller there is a lot of hyper granulation likely due to the continued drainage at this point. We did obtain approval for a snap VAC I think that is good to be appropriate for him and will likely help this tremendously underneath the cast. He is definitely in agreement with proceeding with such. 2/12; patient came in today after his snap VAC lost suction. Brought in to see one of our nurses. The dressing was replaced and then we put the cast back on and rehooked up the snap VAC. Apparently his wound looked very good per our intake nurse. 2/15; again we replaced  the cast on Friday. By Saturday the snap VAC and light suction. He called this morning he comes in acutely. The wounds look fine however the VAC is not functioning. We replaced the cast using silver alginate as the primary dressing backed with Kerramax. The snap VAC was not replaced 12/05/2019 upon evaluation today patient appears to be doing better in regard to his left plantar foot ulcer. Fortunately there is no signs of active infection at this time. Unfortunately he is continuing to have issues with the right foot he is really not making any progress here things seem to be somewhat stagnant to be honest. The depth has increased but that is due to me having debrided the wound in the past based on what I am seeing. 12/12/2019 on evaluation today patient appears to be doing more poorly in regard to the left lower extremity. He has some erythema spreading up the side of his foot I am concerned about infection again at this point.  Unfortunately he has been seeing improvement with a total contact cast but I do not think we should put that on today. On his right plantar foot he continues to have significant drainage this is actually measuring deeper I really do not feel like you are making any progress whatsoever. I have prescribed Granix for him unfortunately his insurance apparently was going to cost him a $500 co-pay. 12/19/2019 upon evaluation today patient actually appears to be doing better in regard to both wounds. With that being said he actually did get the reGranix which he had to pay $500 for. With that being said it does look like that he is actually made some improvement based on what I am seeing at this point with the reGranix. Obviously if he is going to continue this we are going to do something about trying to get him some help in covering the cost. 12/26/2019 on evaluation today patient appears to be doing really much better even compared to last week. Overall the wound seems to be much better even compared to last week and last week was better than the week before. Since has been using the reGranix his symptoms have improved significantly. With that being said the issue right now is simply that this is a very expensive medication for him the first dose cost him $500. Upon inspection patient's wound bed actually is however dramatically improved compared to before he started this 2 [name] ago. 01/02/2020 upon evaluation today patient appears to actually be doing well. He still had a little bit of reGranix left that has been using in small amounts he just been applying it every other day instead of every day in order to make it go longer. Overall we are still seeing excellent improvement he is measuring smaller looking better healthier tissue and everything seems to be pointing to this headed in the right direction. Fortunately there is no evidence of infection either which is also excellent news. He does have his  MRI coming up within the next week. 01/09/2020 upon evaluation today patient appears to be doing a little worse today compared to previous week's evaluation. He is actually been out of the reGranix at this point. He has been trying to make the stretch out so he has been changing the dressings on a regular set schedule like he was previous. I think this has made a difference. Fortunately there is no signs of active infection at this time. No fevers, chills, nausea, vomiting, or diarrhea. 01/16/2020 upon evaluation today patient appears  to be doing well with regard to his left plantar foot ulcer. The right plantar foot still shows some significant depth at this point. Fortunately there is no signs of active infection at this time. Objective Constitutional Well-nourished and well-hydrated in no acute distress. Vitals Time Taken: 1:24 PM, Height: 73 in, Weight: 260 lbs, BMI: 34.3, Temperature: 98.6 F, Pulse: 101 bpm, Respiratory Rate: 18 breaths/min, Blood Pressure: 153/84 mmHg. Respiratory normal breathing without difficulty. Psychiatric this patient is able to make decisions and demonstrates good insight into disease process. Alert and Oriented x 3. pleasant and cooperative. General Notes: Patient's wound bed currently showed signs of good granulation there does not appear to be any signs of active infection which is good news. I did have to perform some sharp debridement on the right plantar foot as there was some hyper granular and poorly granulated tissue. He did have some bleeding chemical cauterization with silver nitrate was used to stop bleeding post debridement. Integumentary (Hair, Skin) Wound #1 status is Open. Original cause of wound was Gradually Appeared. The wound is located on the Left,Plantar Foot. The wound measures 1.6cm length x 2cm width x 0.1cm depth; 2.513cm^2 area and 0.251cm^3 volume. There is Fat Layer (Subcutaneous Tissue) Exposed exposed. There is no tunneling or  undermining noted. There is a medium amount of serosanguineous drainage noted. The wound margin is flat and intact. There is large (67-100%) pink granulation within the wound bed. There is no necrotic tissue within the wound bed. Wound #2 status is Open. Original cause of wound was Gradually Appeared. The wound is located on the Right Metatarsal head second. The wound measures 2.8cm length x 1.8cm width x 1.7cm depth; 3.958cm^2 area and 6.729cm^3 volume. There is Fat Layer (Subcutaneous Tissue) Exposed exposed. There is no tunneling or undermining noted. There is a medium amount of serosanguineous drainage noted. The wound margin is flat and intact. There is large (67-100%) pink granulation within the wound bed. There is a small (1-33%) amount of necrotic tissue within the wound bed including Adherent Slough. 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 There was a Excisional Skin/Subcutaneous Tissue Debridement with a total area of 6 sq cm performed by [name] Kelp, PA. With the following instrument(s): Curette to remove Viable and Non-Viable tissue/material. Material removed includes Callus, Subcutaneous Tissue, Slough, and Skin: Epidermis after achieving pain control using Other (benzocaine 20% spray). No specimens were taken. A time out was conducted at 14:10, prior to the start of the procedure. A Minimum amount of  bleeding was controlled with Pressure. The procedure was tolerated well with a pain level of 0 throughout and a pain level of 0 following the procedure. Post Debridement Measurements: 2.8cm length x 1.8cm width x 1.7cm depth; 6.729cm^3 volume. Character of Wound/Ulcer Post Debridement is improved. Severity of Tissue Post Debridement is: Fat layer exposed. Post procedure Diagnosis Wound #2: Same as Pre-Procedure Plan Follow-up Appointments: Return Appointment in 1 week. Dressing Change Frequency: Wound #1 Left,Plantar Foot: Change Dressing every other day. Wound #2 Right Metatarsal  head second: Change Dressing every other day. Wound Cleansing: Wound #1 Left,Plantar Foot: May shower and wash wound with soap and water. Wound #2 Right Metatarsal head second: May shower and wash wound with soap and water. Primary Wound Dressing: Wound #1 Left,Plantar Foot: Endoform - moisten with saline Wound #2 Right Metatarsal head second: Endoform - moisten with saline Secondary Dressing: Wound #1 Left,Plantar Foot: Kerlix/Rolled Gauze Dry Gauze ABD pad Wound #2 Right Metatarsal head second: Kerlix/Rolled Gauze Dry Gauze Drawtex - cut to fit inside wound margins to hold endoform in place (may need 2 layers to fill space) Other: - felt callous pad Edema Control: Patient to wear own compression stockings - daily to both legs Avoid standing for long periods of time Elevate legs to the level of the heart or above for 30 minutes daily and/or when sitting, a frequency of: Off-Loading: Open toe surgical shoe to: - both feet The following medication(s) was prescribed: Regranex topical 0.01 % gel gel topical applied to the wound bed of each of your feet daily as directed in the clinic under your dressings starting 01/25/2020 1. My suggestion at this time is can be that we go ahead and switch to endoform for both wounds to see if this will continue to help him. Unfortunately the reGranix was not covered  or at least not covered well by his insurance even after all the approval process. This was going to cost him $500. 2. I am also can recommend at this point that we go ahead and initiate treatment with continuation of the rolled gauze to secure everything in place. 3. He still needs to keep his feet protected as much as possible I recommend minimal walking to be honest at this point. We will see patient back for reevaluation in 1 week here in the clinic. If anything worsens or changes patient will contact our office for additional recommendations. Electronic Signature(s) Signed: 01/25/2020 4:18:05 PM By: [name] Kelp PA-C Previous Signature: 01/16/2020 2:22:04 PM Version By: [name] Kelp PA-C Entered By: [name] on 01/25/2020 16:18:05 -------------------------------------------------------------------------------- SuperBill Details Patient Name: Date of Service: [name], [name] 01/16/2020 Medical Record VHQION:[id number] Patient Account Number: [id number] Date of Birth/Sex: Treating RN: [birth year] (59 y.o. [name] Primary Care Provider: Cheryll [name] Other Clinician: Referring Provider: Treating Provider/Extender:[name] in Treatment: 11 Diagnosis Coding ICD-10 Codes Code Description E11.622 Type 2 diabetes mellitus with other skin ulcer L97.522 Non-pressure chronic ulcer of other part of left foot with fat layer exposed L97.512 Non-pressure chronic ulcer of other part of right foot with fat layer exposed I10 Essential (primary) hypertension I50.42 Chronic combined systolic (congestive) and diastolic (congestive) heart failure I87.2 Venous insufficiency (chronic) (peripheral) L84 Corns and callosities Facility Procedures CPT4 Code Description: 24401027 11042 - DEB SUBQ TISSUE 20 SQ CM/< ICD-10 Diagnosis Description L97.512 Non-pressure chronic ulcer of other part of right foot wi Modifier: th fat layer e Quantity: 1 xposed Physician  Procedures CPT4 Code Description: 2536644 11042 - WC PHYS SUBQ TISS 20 SQ CM ICD-10 Diagnosis Description L97.512 Non-pressure chronic ulcer of other part of right foot wi Modifier: th fat layer e Quantity: 1 xposed Electronic Signature(s) Signed: 01/16/2020 2:22:11 PM By: [name] Kelp PA-C Entered By: [name] on 01/16/2020 14:22:10

## 2020-01-17 ENCOUNTER — Ambulatory Visit: Payer: Medicare Other | Attending: Internal Medicine

## 2020-01-17 DIAGNOSIS — Z23 Encounter for immunization: Secondary | ICD-10-CM

## 2020-01-17 NOTE — Progress Notes (Signed)
   Covid-19 Vaccination Clinic  Name:  [name]    MRN: [id number] DOB: Sep 23, 1961  01/17/2020  [name] was observed post Covid-19 immunization for 15 minutes without incident. He was provided with Vaccine Information Sheet and instruction to access the V-Safe system.   [name] was instructed to call 911 with any severe reactions post vaccine: Marland Kitchen Difficulty breathing  . Swelling of face and throat  . A fast heartbeat  . A bad rash all over body  . Dizziness and weakness   Immunizations Administered    Name Date Dose VIS Date Route   Pfizer COVID-19 Vaccine 01/17/2020  2:54 PM 0.3 mL 09/28/2019 Intramuscular   Manufacturer: [employer], [employer]   Lot: [id number]   NDC: [address]-0

## 2020-01-19 ENCOUNTER — Other Ambulatory Visit: Payer: Self-pay | Admitting: Internal Medicine

## 2020-01-21 NOTE — Progress Notes (Signed)
Subjective:    Patient ID: [name], male    DOB: 1961-07-17, 59 y.o.   MRN: [id number]  HPI The patient is here for an acute visit.   Water in ears?:  He has been feeling a clogged sensation and pressure feeling in his left ear.  He assumed it was probably earwax and he did try to get it out at home using earwax removal drops and sticking things in his ear.  He was experiencing decreased hearing.   ? Left arm bite -he has what looks like several bites on his left arm only.  He was wondering if they were fleabites.  He states that his dog often is on his left arm.  He has several other marks on his arm and states that is from wrestling with the dog.  He has chronic pink papules on both arms and nose are chronic and unchanged.  He was more concerned about red marks on the left arm and even one on his face that do not itch, but are several dots in a row that could be from a flea.    Medications and allergies reviewed with patient and updated if appropriate.  Patient Active Problem List   Diagnosis Date Noted  . Alcoholic peripheral neuropathy (HCC) 10/28/2019  . Chronic diastolic CHF (congestive heart failure) (HCC) 10/15/2019  . Chronic foot ulcer (HCC) 10/15/2019  . SIRS (systemic inflammatory response syndrome) (HCC) 10/15/2019  . Lymphedema 05/04/2019  . Alcoholic cirrhosis of liver without ascites (HCC) 04/27/2019  . Obesity hypoventilation syndrome (HCC) 04/27/2019  . Depression 04/25/2019  . Severe alcohol use disorder (HCC)   . DOE (dyspnea on exertion) 04/22/2019  . Hypothyroidism 04/22/2019  . Anemia 02/06/2019  . Acute on chronic diastolic CHF (congestive heart failure) (HCC) 01/30/2019  . Cellulitis 01/24/2019  . Avascular necrosis of hip, left (HCC) 11/02/2018  . Decreased hearing of both ears 10/30/2018  . Avascular necrosis of hip, right (HCC) 08/16/2018  . Bilateral leg edema 02/27/2018  . Marijuana abuse 02/16/2018  . Ulcer of left heel (HCC) 12/21/2016  .  Bilateral carpal tunnel syndrome 12/15/2016  . Fatty liver 12/14/2016  . Hyperlipidemia 11/25/2016  . Elevated liver enzymes 11/25/2016  . Prediabetes 11/25/2016  . Hypertension 11/23/2016  . Toe amputation status, right 11/23/2016  . Morbid obesity (HCC) 11/23/2016  . OSA (obstructive sleep apnea) 11/23/2016  . Other hammer toe (acquired) 11/11/2013  . Exstrophy of bladder 11/11/2013    Current Outpatient Medications on File Prior to Visit  Medication Sig Dispense Refill  . diphenhydrAMINE (BENADRYL ALLERGY) 25 MG tablet Take 25 mg by mouth at bedtime as needed for allergies.     . DULoxetine (CYMBALTA) 20 MG capsule Take 1 capsule (20 mg total) by mouth daily. 90 capsule 1  . furosemide (LASIX) 40 MG tablet TAKE 1 TABLET (40 MG TOTAL) BY MOUTH 2 (TWO) TIMES DAILY. 180 tablet 1  . KLOR-CON M20 20 MEQ tablet TAKE 2 TABLETS (40 MEQ TOTAL) BY MOUTH 3 (THREE) TIMES DAILY. (Patient taking differently: 3 (three) times daily. ) 180 tablet 1  . levothyroxine (SYNTHROID) 50 MCG tablet Take 1 tablet (50 mcg total) by mouth daily. Need office visit for more refills. 30 tablet 0  . metoprolol tartrate (LOPRESSOR) 50 MG tablet Take 1 tablet (50 mg total) by mouth 2 (two) times daily. 60 tablet 0  . thiamine 100 MG tablet TAKE 1 TABLET BY MOUTH EVERY DAY 100 tablet 0  . traZODone (DESYREL) 50 MG tablet  TAKE 1-2 TABLETS (50-100 MG TOTAL) BY MOUTH AT BEDTIME AS NEEDED FOR SLEEP. 180 tablet 1  . TURMERIC PO Take by mouth.    . REGRANEX 0.01 % gel APPLY NICKEL THICK AMOUNT TO WOUND BED AND THEN COVER WITH A DRESSING AS DIRECTED IN CLINIC DAILY     No current facility-administered medications on file prior to visit.    Past Medical History:  Diagnosis Date  . Alcohol dependence ([place])   . Arthritis    hips, hands  . Bilateral carpal tunnel syndrome   . Bilateral leg edema    Chronic  . Diabetes mellitus without complication ([place])    type 2  . Diverticulitis    portion of colon removed  . DOE  (dyspnea on exertion)    occ  . Elevated liver enzymes   . Fatty liver   . GERD (gastroesophageal reflux disease)    occ  . Hammer toe   . Hip pain   . History of ventral hernia repair 2016   x2  . Hyperlipidemia    pt unsure  . Hypertension   . Marijuana abuse   . Morbid obesity ([place])   . Neuromuscular disorder ([place])    peripheral neuropathy feet and few fingers  . OSA (obstructive sleep apnea)    has OSA-not used CPAP 2-3 yrs could not tolerate cpap  . PONV (postoperative nausea and vomiting)   . Toe ulcer ([place])    left healed    Past Surgical History:  Procedure Laterality Date  . AMPUTATION TOE Left 05/25/2018   Procedure: AMPUTATION TOE left 3rd;  Surgeon: Wylene Simmer, MD;  Location: [facility];  Service: Orthopedics;  Laterality: Left;  33min, to follow  . AMPUTATION TOE Left 06/28/2018   Procedure: Left foot revision 3rd toe amputation including 3rd metatarsal;  Surgeon: Wylene Simmer, MD;  Location: [facility];  Service: Orthopedics;  Laterality: Left;  48min  . BICEPS TENDON REPAIR Left 2014   Partial  . COLONOSCOPY    . HERNIA REPAIR  2016   ventral  . HIP CLOSED REDUCTION Right 09/01/2018   Procedure: CLOSED REDUCTION HIP;  Surgeon: Nicholes Stairs, MD;  Location: [facility];  Service: Orthopedics;  Laterality: Right;  . JOINT REPLACEMENT     b/l knees   . TOE AMPUTATION Right 09/2013  . TOTAL HIP ARTHROPLASTY Right 08/16/2018   Procedure: TOTAL HIP ARTHROPLASTY ANTERIOR APPROACH;  Surgeon: Rod Can, MD;  Location: [facility];  Service: Orthopedics;  Laterality: Right;  . TOTAL HIP ARTHROPLASTY Left 11/02/2018   Procedure: TOTAL HIP ARTHROPLASTY ANTERIOR APPROACH;  Surgeon: Rod Can, MD;  Location: [facility];  Service: Orthopedics;  Laterality: Left;  . TOTAL KNEE ARTHROPLASTY     bilat    Social History   Socioeconomic History  . Marital status: Divorced    Spouse name: Not on file  . Number of children: Not on file    . Years of education: Not on file  . Highest education level: Not on file  Occupational History  . Occupation: unemployed, Hydrographic surveyor for disability  Tobacco Use  . Smoking status: Former Smoker    Packs/day: 0.25    Years: 10.00    Pack years: 2.50    Types: Cigarettes  . Smokeless tobacco: Never Used  . Tobacco comment: quit 2018  Substance and Sexual Activity  . Alcohol use: Yes    Comment: 2-3 drinks 4-5 nights a week, liquor  . Drug use: Yes  Frequency: 1.0 times per week    Types: Marijuana    Comment: "4 times a month, maybe"  . Sexual activity: Not on file  Other Topics Concern  . Not on file  Social History Narrative  . Not on file   Social Determinants of Health   Financial Resource Strain:   . Difficulty of Paying Living Expenses:   Food Insecurity:   . Worried About Programme researcher, broadcasting/film/video in the Last Year:   . Barista in the Last Year:   Transportation Needs:   . Freight forwarder (Medical):   Marland Kitchen Lack of Transportation (Non-Medical):   Physical Activity:   . Days of Exercise per Week:   . Minutes of Exercise per Session:   Stress:   . Feeling of Stress :   Social Connections:   . Frequency of Communication with Friends and Family:   . Frequency of Social Gatherings with Friends and Family:   . Attends Religious Services:   . Active Member of Clubs or Organizations:   . Attends Banker Meetings:   Marland Kitchen Marital Status:     Family History  Adopted: Yes  Family history unknown: Yes    Review of Systems  Constitutional: Negative for fever.  HENT: Positive for hearing loss. Negative for congestion, ear discharge, ear pain (Clogged sensation left ear), sinus pain and sore throat.   Skin: Positive for color change, rash and wound.       Objective:   Vitals:   01/22/20 1336  BP: (!) 158/92  Pulse: (!) 104  Resp: 16  Temp: 98.2 F (36.8 C)  SpO2: 95%   BP Readings from Last 3 Encounters:  01/22/20 (!) 158/92  10/29/19  134/70  10/18/19 131/76   Wt Readings from Last 3 Encounters:  01/22/20 283 lb (128.4 kg)  10/29/19 264 lb (119.7 kg)  10/15/19 250 lb (113.4 kg)   Body mass index is 37.34 kg/m.   Physical Exam Constitutional:      General: He is not in acute distress.    Appearance: Normal appearance. He is not ill-appearing.  HENT:     Head: Normocephalic and atraumatic.     Right Ear: Tympanic membrane, ear canal and external ear normal. There is no impacted cerumen.     Left Ear: Tympanic membrane, ear canal and external ear normal. There is no impacted cerumen.     Ears:     Comments: Left ear was cleaned out when I examined him.  Minimal irritation in left ear canal from ear lavage.  He states his symptoms have been relieved with removal of the wax Musculoskeletal:     Right lower leg: Edema (1+) present.     Left lower leg: Edema (Mild) present.  Skin:    General: Skin is warm and dry.     Comments: He has several lesions on both forearms-chronic lesions are pink in color, raised and unchanged.  There are several of them.  He has a few cuts, bruises on his left forearm, which he attributes to wrestling and playing with the dog.  He has a couple of areas of a few dots in her will that are nonblanching, dark erythema that he was concerned about being fleabites.  These are nontender to palpation  Neurological:     Mental Status: He is alert.            Assessment & Plan:    See Problem List for Assessment and Plan of  chronic medical problems.    This visit occurred during the SARS-CoV-2 public health emergency.  Safety protocols were in place, including screening questions prior to the visit, additional usage of staff PPE, and extensive cleaning of exam room while observing appropriate contact time as indicated for disinfecting solutions.

## 2020-01-21 NOTE — Patient Instructions (Addendum)
  Your ear was cleaned out.       Consider seeing a dermatologist.  I do not think you have flea bites.

## 2020-01-22 ENCOUNTER — Encounter: Payer: Self-pay | Admitting: Internal Medicine

## 2020-01-22 ENCOUNTER — Ambulatory Visit (INDEPENDENT_AMBULATORY_CARE_PROVIDER_SITE_OTHER): Payer: Medicare Other | Admitting: Internal Medicine

## 2020-01-22 ENCOUNTER — Other Ambulatory Visit: Payer: Self-pay

## 2020-01-22 DIAGNOSIS — L989 Disorder of the skin and subcutaneous tissue, unspecified: Secondary | ICD-10-CM | POA: Diagnosis not present

## 2020-01-22 DIAGNOSIS — H938X2 Other specified disorders of left ear: Secondary | ICD-10-CM | POA: Diagnosis not present

## 2020-01-22 NOTE — Assessment & Plan Note (Signed)
Acute on chronic He has several lesions on both forearms that are chronic-pink in color, raised-unsure if these are precancer or not.  I did advise that he see a dermatologist He has several other lesions on only left arm that are related to his dog.  Some are scratch marks and bruises-these are not tender and do not itch - these are not bites and likely related to trauma from the dog He will monitor them

## 2020-01-22 NOTE — Progress Notes (Signed)
Patient consent obtained. Irrigation with water and peroxide performed. Full view of tympanic membranes after procedure.  Patient tolerated procedure well. Left ear 

## 2020-01-22 NOTE — Assessment & Plan Note (Signed)
Acute Left ear clogged Related to excessive cerumen Ear lavage successful in removing the cerumen in relieving symptoms

## 2020-01-23 ENCOUNTER — Encounter (HOSPITAL_BASED_OUTPATIENT_CLINIC_OR_DEPARTMENT_OTHER): Payer: Medicare Other | Admitting: Physician Assistant

## 2020-01-30 ENCOUNTER — Other Ambulatory Visit (HOSPITAL_COMMUNITY): Payer: Self-pay | Admitting: Physician Assistant

## 2020-01-30 ENCOUNTER — Encounter (HOSPITAL_BASED_OUTPATIENT_CLINIC_OR_DEPARTMENT_OTHER): Payer: Medicare Other | Attending: Physician Assistant | Admitting: Physician Assistant

## 2020-01-30 ENCOUNTER — Other Ambulatory Visit: Payer: Self-pay

## 2020-01-30 DIAGNOSIS — E1140 Type 2 diabetes mellitus with diabetic neuropathy, unspecified: Secondary | ICD-10-CM | POA: Insufficient documentation

## 2020-01-30 DIAGNOSIS — E669 Obesity, unspecified: Secondary | ICD-10-CM | POA: Insufficient documentation

## 2020-01-30 DIAGNOSIS — K7460 Unspecified cirrhosis of liver: Secondary | ICD-10-CM | POA: Diagnosis not present

## 2020-01-30 DIAGNOSIS — L97512 Non-pressure chronic ulcer of other part of right foot with fat layer exposed: Secondary | ICD-10-CM

## 2020-01-30 DIAGNOSIS — I872 Venous insufficiency (chronic) (peripheral): Secondary | ICD-10-CM | POA: Diagnosis not present

## 2020-01-30 DIAGNOSIS — E11622 Type 2 diabetes mellitus with other skin ulcer: Secondary | ICD-10-CM | POA: Insufficient documentation

## 2020-01-30 DIAGNOSIS — E1151 Type 2 diabetes mellitus with diabetic peripheral angiopathy without gangrene: Secondary | ICD-10-CM | POA: Diagnosis not present

## 2020-01-30 DIAGNOSIS — I5042 Chronic combined systolic (congestive) and diastolic (congestive) heart failure: Secondary | ICD-10-CM | POA: Diagnosis not present

## 2020-01-30 DIAGNOSIS — L97522 Non-pressure chronic ulcer of other part of left foot with fat layer exposed: Secondary | ICD-10-CM | POA: Diagnosis not present

## 2020-01-30 DIAGNOSIS — L84 Corns and callosities: Secondary | ICD-10-CM | POA: Diagnosis not present

## 2020-01-30 DIAGNOSIS — D649 Anemia, unspecified: Secondary | ICD-10-CM | POA: Diagnosis not present

## 2020-01-30 DIAGNOSIS — I110 Hypertensive heart disease with heart failure: Secondary | ICD-10-CM | POA: Diagnosis not present

## 2020-01-30 DIAGNOSIS — M1990 Unspecified osteoarthritis, unspecified site: Secondary | ICD-10-CM | POA: Diagnosis not present

## 2020-01-30 DIAGNOSIS — Z6834 Body mass index (BMI) 34.0-34.9, adult: Secondary | ICD-10-CM | POA: Diagnosis not present

## 2020-01-30 NOTE — Progress Notes (Signed)
[name], [name] ([id number]) Visit Report for 11/28/2019 Arrival Information Details Patient Name: Date of Service: [name], [name] 11/28/2019 2:30 PM Medical Record [id number] Patient Account Number: [id number] Date of Birth/Sex: Treating RN: 1960/12/20 (59 y.o. [name] Primary Care Parilee Hally: [name] Other Clinician: Referring Laryssa Hassing: Treating Latanya Hemmer/Extender:Stone III, Dayton Martes, Stacy Weeks in Treatment: 4 Visit Information History Since Last Visit Added or deleted any medications: No Patient Arrived: [name] Any new allergies or adverse reactions: No Arrival Time: 14:59 Had a fall or experienced change in No Accompanied By: alone activities of daily living that may affect Transfer Assistance: None risk of falls: Patient Identification Verified: Yes Signs or symptoms of abuse/neglect No Secondary Verification Process Yes since last visito Completed: Hospitalized since last visit: No Patient Requires Transmission-Based No Implantable device outside of the clinic No Precautions: excluding Patient Has Alerts: Yes cellular tissue based products placed in Patient Alerts: L ABI 1.07 the center (04/2019) since last visit: R ABI 1.14 Has Dressing in Place as Prescribed: Yes (04/2019) Has Footwear/Offloading in Place as Yes Prescribed: Left: Total Contact Cast Pain Present Now: No Electronic Signature(s) Signed: 01/30/2020 9:23:26 AM By: Karl Ito Entered By: Karl Ito on 11/28/2019 15:02:54 -------------------------------------------------------------------------------- Encounter Discharge Information Details Patient Name: Date of Service: [name] 11/28/2019 2:30 PM Medical Record [id number] Patient Account Number: [id number] Date of Birth/Sex: Treating RN: 09-08-1961 (58 y.o. Judie Petit) Yevonne Pax Primary Care Timberlyn Pickford: [name] Other Clinician: Referring Mariajose Mow: Treating Juwann Sherk/Extender:Stone III, Dayton Martes, Stacy Weeks  in Treatment: 4 Encounter Discharge Information Items Post Procedure Vitals Discharge Condition: Stable Temperature (F): 97.7 Ambulatory Status: Ambulatory Pulse (bpm): 76 Discharge Destination: Home Respiratory Rate (breaths/min): 20 Transportation: Private Auto Blood Pressure (mmHg): 135/68 Accompanied By: self Schedule Follow-up Appointment: Yes Clinical Summary of Care: Patient Declined Electronic Signature(s) Signed: 11/28/2019 4:53:37 PM By: Yevonne Pax RN Entered By: Yevonne Pax on 11/28/2019 16:17:22 -------------------------------------------------------------------------------- Lower Extremity Assessment Details Patient Name: Date of Service: [name] 11/28/2019 2:30 PM Medical Record [id number] Patient Account Number: [id number] Date of Birth/Sex: Treating RN: 03-Mar-1961 (59 y.o. [name] Primary Care Nainoa Woldt: [name] Other Clinician: Referring Justinian Miano: Treating Staphanie Harbison/Extender:Stone III, Dayton Martes, Stacy Weeks in Treatment: 4 Edema Assessment Assessed: [Left: No] [Right: No] Edema: [Left: Yes] [Right: Yes] Calf Left: Right: Point of Measurement: 30 cm From Medial Instep 38.5 cm 41 cm Ankle Left: Right: Point of Measurement: 11 cm From Medial Instep 25 cm 29.2 cm Vascular Assessment Pulses: Dorsalis Pedis Palpable: [Left:Yes] [Right:Yes] Electronic Signature(s) Signed: 11/28/2019 5:39:41 PM By: Zenaida Deed RN, BSN Signed: 01/30/2020 9:23:26 AM By: Karl Ito Entered By: Karl Ito on 11/28/2019 15:22:12 -------------------------------------------------------------------------------- Multi-Disciplinary Care Plan Details Patient Name: Date of Service: [name] 11/28/2019 2:30 PM Medical Record [id number] Patient Account Number: [id number] Date of Birth/Sex: Treating RN: Mar 07, 1961 (59 y.o. [name] Primary Care Alliyah Roesler: [name] Other Clinician: Referring Feige Lowdermilk: Treating  Kerriann Kamphuis/Extender:Stone III, Dayton Martes, Stacy Weeks in Treatment: 4 Active Inactive Nutrition Nursing Diagnoses: Potential for alteratiion in Nutrition/Potential for imbalanced nutrition Goals: Patient/caregiver will maintain therapeutic glucose control Date Initiated: 10/31/2019 Target Resolution Date: 12/26/2019 Goal Status: Active Interventions: Assess HgA1c results as ordered upon admission and as needed Provide education on elevated blood sugars and impact on wound healing Treatment Activities: Patient referred to Primary Care Physician for further nutritional evaluation : 10/31/2019 Notes: Wound/Skin Impairment Nursing Diagnoses: Impaired tissue integrity Knowledge deficit related to ulceration/compromised skin integrity Goals: Patient/caregiver will verbalize understanding of skin care regimen Date Initiated: 10/31/2019 Target Resolution Date: 12/26/2019 Goal Status:  Active Ulcer/skin breakdown will have a volume reduction of 30% by week 4 Date Initiated: 10/31/2019 Date Inactivated: 11/28/2019 Target Resolution Date: 11/28/2019 Unmet Reason: left Goal Status: Unmet improving, right unable to offload Ulcer/skin breakdown will have a volume reduction of 50% by week 8 Date Initiated: 11/28/2019 Target Resolution Date: 12/26/2019 Goal Status: Active Interventions: Assess patient/caregiver ability to obtain necessary supplies Assess patient/caregiver ability to perform ulcer/skin care regimen upon admission and as needed Assess ulceration(s) every visit Provide education on ulcer and skin care Treatment Activities: Skin care regimen initiated : 10/31/2019 Topical wound management initiated : 10/31/2019 Notes: Electronic Signature(s) Signed: 11/28/2019 5:39:41 PM By: Baruch Gouty RN, BSN Entered By: Baruch Gouty on 11/28/2019 15:34:09 -------------------------------------------------------------------------------- Negative Pressure Wound Therapy Application (NPWT)  Details Patient Name: Date of Service: [name] 11/28/2019 2:30 PM Medical Record [id number] Patient Account Number: [id number] Date of Birth/Sex: Treating RN: Feb 22, 1961 (59 y.o. [name] Primary Care Pelham Hennick: Billey Gosling Other Clinician: Referring Jomar Denz: Treating Jaelyn Bourgoin/Extender:Stone III, Elby Showers, Stacy Weeks in Treatment: 4 NPWT Application Performed for: Wound #1 Left,Plantar Foot Additional Injuries Covered: No Performed By: Baruch Gouty, RN Type: Other Coverage Size (sq cm): 7 Pressure Type: Constant Pressure Setting: 125 mmHG Drain Type: None Quantity of Sponges/Gauze Inserted: 1 Sponge/Dressing Type: Foam, Green Date Initiated: 11/28/2019 Response to Treatment: good Post Procedure Diagnosis Same as Pre-procedure Notes SNAP VAC Electronic Signature(s) Signed: 11/28/2019 5:39:41 PM By: Baruch Gouty RN, BSN Entered By: Baruch Gouty on 11/28/2019 15:42:12 -------------------------------------------------------------------------------- Pain Assessment Details Patient Name: Date of Service: BIRCH, FARINO 11/28/2019 2:30 PM Medical Record [id number] Patient Account Number: [id number] Date of Birth/Sex: Treating RN: [birth year] (59 y.o. [name] Primary Care Sally-Ann Cutbirth: Billey Gosling Other Clinician: Referring Ciji Boston: Treating Eulice Rutledge/Extender:Stone III, Elby Showers, Stacy Weeks in Treatment: 4 Active Problems Location of Pain Severity and Description of Pain Patient Has Paino No Site Locations Pain Management and Medication Current Pain Management: Electronic Signature(s) Signed: 11/28/2019 5:39:41 PM By: Baruch Gouty RN, BSN Signed: 01/30/2020 9:23:26 AM By: Sandre Kitty Entered By: Sandre Kitty on 11/28/2019 15:03:22 -------------------------------------------------------------------------------- Patient/Caregiver Education Details Patient Name: Date of Service: KHADEN, GATER 2/10/2021andnbsp2:30  PM Medical Record [id number] Patient Account Number: [id number] Date of Birth/Gender: July 30, 1961 (59 y.o. M) Treating RN: Baruch Gouty Primary Care Physician: Billey Gosling Other Clinician: Referring Physician: Treating Physician/Extender:Stone III, Elby Showers, Nadine Counts in Treatment: 4 Education Assessment Education Provided To: Patient Education Topics Provided Offloading: Methods: Explain/Verbal Responses: Reinforcements needed, State content correctly Wound/Skin Impairment: Methods: Explain/Verbal Responses: Reinforcements needed, State content correctly Electronic Signature(s) Signed: 11/28/2019 5:39:41 PM By: Baruch Gouty RN, BSN Entered By: Baruch Gouty on 11/28/2019 15:34:34 -------------------------------------------------------------------------------- Wound Assessment Details Patient Name: Date of Service: YANIV, LAGE 11/28/2019 2:30 PM Medical Record [id number] Patient Account Number: [id number] Date of Birth/Sex: Treating RN: 1961-05-17 (59 y.o. [name] Primary Care Nnamdi Dacus: Billey Gosling Other Clinician: Referring Aurea Aronov: Treating Petar Mucci/Extender:Stone III, Elby Showers, Stacy Weeks in Treatment: 4 Wound Status Wound Number: 1 Primary Diabetic Wound/Ulcer of the Lower Extremity Etiology: Wound Location: Left Foot - Plantar Wound Open Wounding Event: Gradually Appeared Status: Date Acquired: 02/16/2019 Comorbid Anemia, Sleep Apnea, Congestive Heart Weeks Of Treatment: 4 History: Failure, Hypertension, Peripheral Venous Clustered Wound: No Disease, Cirrhosis , Type II Diabetes, Osteoarthritis, Neuropathy Photos Wound Measurements Length: (cm) 2.5 % Reduction Width: (cm) 2.8 % Reduction Depth: (cm) 0.1 Epitheliali Area: (cm) 5.498 Tunneling: Volume: (cm) 0.55 Underminin Wound Description Classification: Grade 2 Wound Margin: Flat and Intact Exudate Amount: Large Exudate Type: Serosanguineous Exudate Color: red,  brown Wound Bed Granulation Amount: Large (67-100%) Granulation Quality: Pink Necrotic Amount: Small (1-33%) Necrotic Quality: Adherent Slough Foul Odor After Cleansing: No Slough/Fibrino Yes Exposed Structure Fascia Exposed: No Fat Layer (Subcutaneous Tissue) Exposed: Yes Tendon Exposed: No Muscle Exposed: No Joint Exposed: No Bone Exposed: No in Area: 38.6% in Volume: 91.2% zation: Small (1-33%) No g: No Electronic Signature(s) Signed: 11/29/2019 4:30:47 PM By: Benjaman Kindler EMT/HBOT Signed: 11/30/2019 5:30:09 PM By: Zenaida Deed RN, BSN Previous Signature: 11/28/2019 5:39:41 PM Version By: Zenaida Deed RN, BSN Entered By: Benjaman Kindler on 11/29/2019 11:39:07 -------------------------------------------------------------------------------- Wound Assessment Details Patient Name: Date of Service: DAM, ASHRAF 11/28/2019 2:30 PM Medical Record [id number] Patient Account Number: [id number] Date of Birth/Sex: Treating RN: 1960-11-23 (59 y.o. [name] Primary Care Aliyana Dlugosz: [name] Other Clinician: Referring Zaion Hreha: Treating Elaf Clauson/Extender:Stone III, Dayton Martes, Stacy Weeks in Treatment: 4 Wound Status Wound Number: 2 Primary Diabetic Wound/Ulcer of the Lower Extremity Etiology: Wound Location: Right Metatarsal head second Wound Open Wounding Event: Gradually Appeared Status: Date Acquired: 02/16/2019 Comorbid Anemia, Sleep Apnea, Congestive Heart Weeks Of Treatment: 4 History: Failure, Hypertension, Peripheral Venous Clustered Wound: No Disease, Cirrhosis , Type II Diabetes, Osteoarthritis, Neuropathy Photos Wound Measurements Length: (cm) 2.3 Width: (cm) 1.9 Depth: (cm) 1 Area: (cm) 3.432 Volume: (cm) 3.432 % Reduction in Area: -56.1% % Reduction in Volume: -30% Epithelialization: Small (1-33%) Tunneling: No Undermining: Yes Starting Position (o'clock): 3 Ending Position (o'clock): 6 Maximum Distance: (cm) 0.5 Wound  Description Classification: Grade 2 Wound Margin: Flat and Intact Exudate Amount: Medium Exudate Type: Serosanguineous Exudate Color: red, brown Wound Bed Granulation Amount: Large (67-100%) Granulation Quality: Pink Necrotic Amount: Small (1-33%) Necrotic Quality: Adherent Slough Foul Odor After Cleansing: No Slough/Fibrino Yes Exposed Structure Fascia Exposed: No Fat Layer (Subcutaneous Tissue) Exposed: Yes Tendon Exposed: No Muscle Exposed: No Joint Exposed: No Bone Exposed: No Electronic Signature(s) Signed: 11/29/2019 4:30:47 PM By: Benjaman Kindler EMT/HBOT Signed: 11/30/2019 5:30:09 PM By: Zenaida Deed RN, BSN Previous Signature: 11/28/2019 5:39:41 PM Version By: Zenaida Deed RN, BSN Entered By: Benjaman Kindler on 11/29/2019 11:39:25 -------------------------------------------------------------------------------- Vitals Details Patient Name: Date of Service: RENNER, SEBALD 11/28/2019 2:30 PM Medical Record [id number] Patient Account Number: [id number] Date of Birth/Sex: Treating RN: 1960-12-17 (59 y.o. [name] Primary Care Salsabeel Gorelick: [name] Other Clinician: Referring Laurrie Toppin: Treating Idalys Konecny/Extender:Stone III, Dayton Martes, Stacy Weeks in Treatment: 4 Vital Signs Time Taken: 15:02 Temperature (F): 97.7 Height (in): 73 Pulse (bpm): 76 Weight (lbs): 260 Respiratory Rate (breaths/min): 20 Body Mass Index (BMI): 34.3 Blood Pressure (mmHg): 135/68 Reference Range: 80 - 120 mg / dl Electronic Signature(s) Signed: 01/30/2020 9:23:26 AM By: Karl Ito Entered By: Karl Ito on 11/28/2019 15:03:15

## 2020-01-30 NOTE — Progress Notes (Signed)
NORMAN, PIACENTINI ([id number]) Visit Report for 01/09/2020 Arrival Information Details Patient Name: Date of Service: EDU, ON 01/09/2020 1:00 PM Medical Record [id number] Patient Account Number: [id number] Date of Birth/Sex: Treating RN: [birth year] (59 y.o. [name] Primary Care Oluwadamilola Rosamond: Billey Gosling Other Clinician: Referring Hodan Wurtz: Treating Chi Garlow/Extender:Stone III, Elby Showers, Stacy Weeks in Treatment: 10 Visit Information History Since Last Visit Added or deleted any medications: No Patient Arrived: Ambulatory Any new allergies or adverse reactions: No Arrival Time: 13:17 Had a fall or experienced change in No Accompanied By: alone activities of daily living that may affect Transfer Assistance: None risk of falls: Patient Identification Verified: Yes Signs or symptoms of abuse/neglect since last No Secondary Verification Process Yes visito Completed: Hospitalized since last visit: No Patient Requires Transmission-Based No Implantable device outside of the clinic excluding No Precautions: cellular tissue based products placed in the center Patient Has Alerts: Yes since last visit: Patient Alerts: L ABI 1.07 Has Dressing in Place as Prescribed: Yes (04/2019) Pain Present Now: No R ABI 1.14 (04/2019) Electronic Signature(s) Signed: 01/30/2020 9:02:47 AM By: [name] RN, BSN Entered By: [name] on 01/09/2020 13:19:55 -------------------------------------------------------------------------------- Encounter Discharge Information Details Patient Name: Date of Service: JEREMAINE, MARAJ 01/09/2020 1:00 PM Medical Record [id number] Patient Account Number: [id number] Date of Birth/Sex: Treating RN: 06-02-1961 (58 y.o. Jerilynn Mages) Carlene Coria Primary Care Magaline Steinberg: Billey Gosling Other Clinician: Referring Alizza Sacra: Treating Lacara Dunsworth/Extender:Stone III, Elby Showers, Stacy Weeks in Treatment: 10 Encounter Discharge Information Items Post Procedure  Vitals Discharge Condition: Stable Temperature (F): 97.9 Ambulatory Status: Cane Pulse (bpm): 99 Discharge Destination: Home Respiratory Rate (breaths/min): 18 Transportation: Private Auto Blood Pressure (mmHg): 138/67 Accompanied By: self Schedule Follow-up Appointment: Yes Clinical Summary of Care: Patient Declined Electronic Signature(s) Signed: 01/09/2020 5:15:50 PM By: Carlene Coria RN Entered By: Carlene Coria on 01/09/2020 14:45:04 -------------------------------------------------------------------------------- Lower Extremity Assessment Details Patient Name: Date of Service: TC, KAPUSTA 01/09/2020 1:00 PM Medical Record [id number] Patient Account Number: [id number] Date of Birth/Sex: Treating RN: 07-Aug-1961 (59 y.o. [name] Primary Care Kee Drudge: Billey Gosling Other Clinician: Referring Raziya Aveni: Treating Simrat Kendrick/Extender:Stone III, Elby Showers, Stacy Weeks in Treatment: 10 Edema Assessment Assessed: [Left: No] [Right: No] Edema: [Left: Yes] [Right: Yes] Calf Left: Right: Point of Measurement: 30 cm From Medial Instep 42.5 cm 44 cm Ankle Left: Right: Point of Measurement: 11 cm From Medial Instep 29.5 cm 32 cm Vascular Assessment Pulses: Dorsalis Pedis Palpable: [Left:Yes] [Right:Yes] Electronic Signature(s) Signed: 01/30/2020 9:02:47 AM By: [name] RN, BSN Entered By: [name] on 01/09/2020 13:21:30 -------------------------------------------------------------------------------- Multi-Disciplinary Care Plan Details Patient Name: Date of Service: BARRIE, WALE 01/09/2020 1:00 PM Medical Record [id number] Patient Account Number: [id number] Date of Birth/Sex: Treating RN: 30-Jan-1961 (59 y.o. [name] Primary Care Willo Yoon: Other Clinician: Billey Gosling Referring Heran Campau: Treating Zahria Ding/Extender:Stone III, Elby Showers, Stacy Weeks in Treatment: 10 Active Inactive Nutrition Nursing Diagnoses: Potential for  alteratiion in Nutrition/Potential for imbalanced nutrition Goals: Patient/caregiver will maintain therapeutic glucose control Date Initiated: 10/31/2019 Target Resolution Date: 01/23/2020 Goal Status: Active Interventions: Assess HgA1c results as ordered upon admission and as needed Provide education on elevated blood sugars and impact on wound healing Treatment Activities: Patient referred to Primary Care Physician for further nutritional evaluation : 10/31/2019 Notes: Wound/Skin Impairment Nursing Diagnoses: Impaired tissue integrity Knowledge deficit related to ulceration/compromised skin integrity Goals: Patient/caregiver will verbalize understanding of skin care regimen Date Initiated: 10/31/2019 Target Resolution Date: 01/23/2020 Goal Status: Active Ulcer/skin breakdown will have a volume reduction of 30% by week 4 Date Initiated: 10/31/2019  Date Inactivated: 11/28/2019 Target Resolution Date: 11/28/2019 Unmet Reason: left Goal Status: Unmet improving, right unable to offload Ulcer/skin breakdown will have a volume reduction of 50% by week 8 Date Initiated: 11/28/2019 Date Inactivated: 12/26/2019 Target Resolution Date: 12/26/2019 Goal Status: Met Ulcer/skin breakdown will have a volume reduction of 80% by week 12 Date Initiated: 12/26/2019 Target Resolution Date: 01/23/2020 Goal Status: Active Interventions: Assess patient/caregiver ability to obtain necessary supplies Assess patient/caregiver ability to perform ulcer/skin care regimen upon admission and as needed Assess ulceration(s) every visit Provide education on ulcer and skin care Treatment Activities: Skin care regimen initiated : 10/31/2019 Topical wound management initiated : 10/31/2019 Notes: Electronic Signature(s) Signed: 01/09/2020 5:36:50 PM By: Baruch Gouty RN, BSN Entered By: Baruch Gouty on 01/09/2020 14:06:08 -------------------------------------------------------------------------------- Pain Assessment  Details Patient Name: Date of Service: AVIER, JECH 01/09/2020 1:00 PM Medical Record [id number] Patient Account Number: [id number] Date of Birth/Sex: Treating RN: [birth year] (59 y.o. [name] Primary Care Taelyn Nemes: Billey Gosling Other Clinician: Referring Ranee Peasley: Treating Dshaun Reppucci/Extender:Stone III, Elby Showers, Stacy Weeks in Treatment: 10 Active Problems Location of Pain Severity and Description of Pain Patient Has Paino No Site Locations Pain Management and Medication Current Pain Management: Electronic Signature(s) Signed: 01/30/2020 9:02:47 AM By: [name] RN, BSN Entered By: [name] on 01/09/2020 13:20:17 -------------------------------------------------------------------------------- Patient/Caregiver Education Details Patient Name: Date of Service: JAIDON, ELLERY 3/24/2021andnbsp1:00 PM Medical Record [phone] Patient Account Number: [id number] Date of Birth/Gender: 1961/05/01 (58 y.o. M) Treating RN: Baruch Gouty Primary Care Physician: Billey Gosling Other Clinician: Referring Physician: Treating Physician/Extender:Stone III, Elby Showers, Nadine Counts in Treatment: 10 Education Assessment Education Provided To: Patient Education Topics Provided Elevated Blood Sugar/ Impact on Healing: Methods: Explain/Verbal Responses: Reinforcements needed, State content correctly Offloading: Methods: Explain/Verbal Responses: Reinforcements needed, State content correctly Wound/Skin Impairment: Methods: Explain/Verbal Responses: Reinforcements needed, State content correctly Electronic Signature(s) Signed: 01/09/2020 5:36:50 PM By: Baruch Gouty RN, BSN Entered By: Baruch Gouty on 01/09/2020 14:07:04 -------------------------------------------------------------------------------- Wound Assessment Details Patient Name: Date of Service: EBER, FERRUFINO 01/09/2020 1:00 PM Medical Record [id number] Patient Account Number:  [id number] Date of Birth/Sex: Treating RN: 04-17-1961 (59 y.o. [name] Primary Care Dustina Scoggin: Billey Gosling Other Clinician: Referring Guilford Shannahan: Treating Wilford Merryfield/Extender:Stone III, Elby Showers, Stacy Weeks in Treatment: 10 Wound Status Wound Number: 1 Primary Diabetic Wound/Ulcer of the Lower Extremity Etiology: Wound Location: Left Foot - Plantar Wound Open Wounding Event: Gradually Appeared Status: Date Acquired: 02/16/2019 Comorbid Anemia, Sleep Apnea, Congestive Heart Weeks Of Treatment: 10 History: Failure, Hypertension, Peripheral Venous Clustered Wound: No Disease, Cirrhosis , Type II Diabetes, Osteoarthritis, Neuropathy Photos Photo Uploaded By: Mikeal Hawthorne on 01/10/2020 13:15:29 Wound Measurements Length: (cm) 2 % Reductio Width: (cm) 1.8 % Reductio Depth: (cm) 0.2 Epithelial Area: (cm) 2.827 Tunneling Volume: (cm) 0.565 Undermini Wound Description Classification: Grade 2 Wound Margin: Flat and Intact Exudate Amount: Medium Exudate Type: Serosanguineous Exudate Color: red, brown Wound Bed Granulation Amount: Large (67-100%) Granulation Quality: Pink Necrotic Amount: None Present (0%) Foul Odor After Cleansing: No Slough/Fibrino No Exposed Structure Fascia Exposed: No Fat Layer (Subcutaneous Tissue) Exposed: Yes Tendon Exposed: No Muscle Exposed: No Joint Exposed: No Bone Exposed: No n in Area: 68.4% n in Volume: 91% ization: Small (1-33%) : No ng: No Electronic Signature(s) Signed: 01/30/2020 9:02:47 AM By: [name] RN, BSN Entered By: [name] on 01/09/2020 13:29:52 -------------------------------------------------------------------------------- Wound Assessment Details Patient Name: Date of Service: TIERNAN, SUTO 01/09/2020 1:00 PM Medical Record [id number] Patient Account Number: [id number] Date of Birth/Sex: Treating RN: 1961-05-17 (59 y.o. M)  [name] Primary Care Mirl Hillery: Billey Gosling Other  Clinician: Referring Basem Yannuzzi: Treating Merary Garguilo/Extender:Stone III, Elby Showers, Stacy Weeks in Treatment: 10 Wound Status Wound Number: 2 Primary Diabetic Wound/Ulcer of the Lower Extremity Etiology: Wound Location: Right Metatarsal head second Wound Open Wounding Event: Gradually Appeared Status: Status: Date Acquired: 02/16/2019 Comorbid Anemia, Sleep Apnea, Congestive Heart Weeks Of Treatment: 10 History: Failure, Hypertension, Peripheral Venous Clustered Wound: No Disease, Cirrhosis , Type II Diabetes, Osteoarthritis, Neuropathy Photos Photo Uploaded By: Mikeal Hawthorne on 01/10/2020 13:15:30 Wound Measurements Length: (cm) 2.8 Width: (cm) 1.8 Depth: (cm) 1.1 Area: (cm) 3.958 Volume: (cm) 4.354 Wound Description Classification: Grade 2 Wound Margin: Flat and Intact Exudate Amount: Medium Exudate Type: Serosanguineous Exudate Color: red, brown Wound Bed Granulation Amount: Large (67-100%) Granulation Quality: Pink Necrotic Amount: Small (1-33%) Necrotic Quality: Adherent Slough fter Cleansing: No ino No Exposed Structure ed: No ubcutaneous Tissue) Exposed: Yes ed: No ed: No d: No : No % Reduction in Area: -80% % Reduction in Volume: -65% Epithelialization: Small (1-33%) Tunneling: No Undermining: No Foul Odor A Slough/Fibr Fascia Expos Fat Layer (S Tendon Expos Muscle Expos Joint Expose Bone Exposed Electronic Signature(s) Signed: 01/30/2020 9:02:47 AM By: [name] RN, BSN Entered By: [name] on 01/09/2020 13:29:30 -------------------------------------------------------------------------------- Vitals Details Patient Name: Date of Service: JAYDIEN, PANEPINTO 01/09/2020 1:00 PM Medical Record [id number] Patient Account Number: [id number] Date of Birth/Sex: Treating RN: 13-Jan-1961 (59 y.o. [name] Primary Care Kayann Maj: Billey Gosling Other Clinician: Referring Anatalia Kronk: Treating Ashyr Hedgepath/Extender:Stone III, Elby Showers,  Stacy Weeks in Treatment: 10 Vital Signs Time Taken: 13:19 Temperature (F): 97.9 Height (in): 73 Pulse (bpm): 99 Weight (lbs): 260 Respiratory Rate (breaths/min): 18 Body Mass Index (BMI): 34.3 Blood Pressure (mmHg): 138/67 Reference Range: 80 - 120 mg / dl Electronic Signature(s) Signed: 01/30/2020 9:02:47 AM By: [name] RN, BSN Entered By: [name] on 01/09/2020 13:20:12

## 2020-01-30 NOTE — Progress Notes (Addendum)
[name] ([id number]) Visit Report for 01/30/2020 Arrival Information Details Patient Name: Date of Service: [name], [name] 01/30/2020 1:00 PM Medical Record Number: [id number] Patient Account Number: [id number] Date of Birth/Sex: Treating RN: [birth year] (59 y.o. [name] Primary Care [name]: Billey Gosling Other Clinician: Referring [name]: Treating [name]/Extender: [name] in Treatment: 13 Visit Information History Since Last Visit Added or deleted any medications: No Patient Arrived: Ambulatory Any new allergies or adverse reactions: No Arrival Time: 13:17 Had a fall or experienced change in No Accompanied By: self activities of daily living that may affect Transfer Assistance: None risk of falls: Patient Identification Verified: Yes Signs or symptoms of abuse/neglect since last visito No Secondary Verification Process Completed: Yes Hospitalized since last visit: No Patient Requires Transmission-Based Precautions: No Implantable device outside of the clinic excluding No Patient Has Alerts: Yes cellular tissue based products placed in the center Patient Alerts: L ABI 1.07 (04/2019) since last visit: R ABI 1.14 (04/2019) Has Dressing in Place as Prescribed: Yes Pain Present Now: No Electronic Signature(s) Signed: 01/30/2020 2:20:25 PM By: [name] Entered By: [name] on 01/30/2020 13:17:41 -------------------------------------------------------------------------------- Complex / Palliative Patient Assessment Details Patient Name: Date of Service: [name], [name] IG 01/30/2020 1:00 PM Medical Record Number: [id number] Patient Account Number: [id number] Date of Birth/Sex: Treating RN: Mar 15, 1961 (59 y.o. [name] Primary Care [name]: Billey Gosling Other Clinician: Referring [name]: Treating [name]/Extender: [name] in Treatment: 13 Palliative Management Criteria Complex Wound  Management Criteria Patient has remarkable or complex co-morbidities requiring medications or treatments that extend wound healing times. Examples: Diabetes mellitus with chronic renal failure or end stage renal disease requiring dialysis Advanced or poorly controlled rheumatoid arthritis Diabetes mellitus and end stage chronic obstructive pulmonary disease Active cancer with current chemo- or radiation therapy Type 2 Diabetes, CHF, Cirrhosis, HTN, Sleep apnea Care Approach Wound Care Plan: Complex Wound Management Electronic Signature(s) Signed: 02/19/2020 6:06:55 PM By: Worthy Keeler PA-C Signed: 02/27/2020 2:26:07 PM By: [name] RN, BSN Entered By: [name] on 02/06/2020 09:53:32 -------------------------------------------------------------------------------- Lower Extremity Assessment Details Patient Name: Date of Service: [name] IG 01/30/2020 1:00 PM Medical Record Number: [id number] Patient Account Number: [id number] Date of Birth/Sex: Treating RN: 08-03-1961 (59 y.o. [name] Primary Care [name]: Billey Gosling Other Clinician: Referring [name]: Treating [name]/Extender: [name] Weeks in Treatment: 13 Edema Assessment Assessed: [Left: No] [Right: No] Edema: [Left: Yes] [Right: Yes] Calf Left: Right: Point of Measurement: 30 cm From Medial Instep 42.5 cm 44 cm Ankle Left: Right: Point of Measurement: 11 cm From Medial Instep 29.5 cm 32 cm Vascular Assessment Pulses: Dorsalis Pedis Palpable: [Left:Yes] [Right:Yes] Electronic Signature(s) Signed: 01/30/2020 5:43:43 PM By: [name] RN, BSN Entered By: [name] on 01/30/2020 13:44:57 -------------------------------------------------------------------------------- [place] Details Patient Name: Date of Service: [name] IG 01/30/2020 1:00 PM Medical Record Number: [id number] Patient Account Number: [id number] Date of Birth/Sex: Treating  RN: 07-27-1961 (59 y.o. [name] Primary Care [name]: Billey Gosling Other Clinician: Referring [name]: Treating [name]/Extender: [name] in Treatment: 13 Active Inactive Nutrition Nursing Diagnoses: Potential for alteratiion in Nutrition/Potential for imbalanced nutrition Goals: Patient/caregiver will maintain therapeutic glucose control Date Initiated: 10/31/2019 Target Resolution Date: 01/23/2020 Goal Status: Active Interventions: Assess HgA1c results as ordered upon admission and as needed Provide education on elevated blood sugars and impact on wound healing Treatment Activities: Patient referred to Primary Care Physician for further nutritional evaluation :  10/31/2019 Notes: Wound/Skin Impairment Nursing Diagnoses: Impaired tissue integrity Knowledge deficit related to ulceration/compromised skin integrity Goals: Patient/caregiver will verbalize understanding of skin care regimen Date Initiated: 10/31/2019 Target Resolution Date: 01/23/2020 Goal Status: Active Ulcer/skin breakdown will have a volume reduction of 30% by week 4 Date Initiated: 10/31/2019 Date Inactivated: 11/28/2019 Target Resolution Date: 11/28/2019 Unmet Reason: left improving, right Goal Status: Unmet unable to offload Ulcer/skin breakdown will have a volume reduction of 50% by week 8 Date Initiated: 11/28/2019 Date Inactivated: 12/26/2019 Target Resolution Date: 12/26/2019 Goal Status: Met Ulcer/skin breakdown will have a volume reduction of 80% by week 12 Date Initiated: 12/26/2019 Target Resolution Date: 01/23/2020 Goal Status: Active Interventions: Assess patient/caregiver ability to obtain necessary supplies Assess patient/caregiver ability to perform ulcer/skin care regimen upon admission and as needed Assess ulceration(s) every visit Provide education on ulcer and skin care Treatment Activities: Skin care regimen initiated : 10/31/2019 Topical wound  management initiated : 10/31/2019 Notes: Electronic Signature(s) Signed: 02/01/2020 4:17:05 PM By: Baruch Gouty RN, BSN Entered By: Baruch Gouty on 02/01/2020 16:12:33 -------------------------------------------------------------------------------- Pain Assessment Details Patient Name: Date of Service: [name] IG 01/30/2020 1:00 PM Medical Record Number: [id number] Patient Account Number: [id number] Date of Birth/Sex: Treating RN: 1961/02/01 (59 y.o. [name] Primary Care Sofija Antwi: Billey Gosling Other Clinician: Referring Naydeline Morace: Treating Jaydan Meidinger/Extender: [name] in Treatment: 13 Active Problems Location of Pain Severity and Description of Pain Patient Has Paino No Site Locations Pain Management and Medication Current Pain Management: Electronic Signature(s) Signed: 01/30/2020 2:20:25 PM By: [name] Signed: 01/30/2020 5:43:24 PM By: Baruch Gouty RN, BSN Entered By: [name] on 01/30/2020 13:18:03 -------------------------------------------------------------------------------- Patient/Caregiver Education Details Patient Name: Date of Service: [name], [name] 4/14/2021andnbsp1:00 PM Medical Record Number: [id number] Patient Account Number: [id number] Date of Birth/Gender: Treating RN: [birth year] (59 y.o. [name] Primary Care Physician: Billey Gosling Other Clinician: Referring Physician: Treating Physician/Extender: [name] in Treatment: 13 Education Assessment Education Provided To: Patient Education Topics Provided Offloading: Methods: Explain/Verbal Responses: Reinforcements needed, State content correctly Wound/Skin Impairment: Methods: Explain/Verbal Responses: Reinforcements needed, State content correctly Electronic Signature(s) Signed: 02/01/2020 4:17:05 PM By: Baruch Gouty RN, BSN Entered By: Baruch Gouty on 02/01/2020  16:12:54 -------------------------------------------------------------------------------- Wound Assessment Details Patient Name: Date of Service: [name] IG 01/30/2020 1:00 PM Medical Record Number: [id number] Patient Account Number: [id number] Date of Birth/Sex: Treating RN: [birth year] (59 y.o. [name] Primary Care Kegan Mckeithan: Billey Gosling Other Clinician: Referring Deklen Popelka: Treating Denys Salinger/Extender: [name] Weeks in Treatment: 13 Wound Status Wound Number: 1 Primary Diabetic Wound/Ulcer of the Lower Extremity Etiology: Wound Location: Left, Plantar Foot Wound Open Wounding Event: Gradually Appeared Status: Date Acquired: 02/16/2019 Comorbid Anemia, Sleep Apnea, Congestive Heart Failure, Hypertension, Weeks Of Treatment: 13 History: Peripheral Venous Disease, Cirrhosis , Type II Diabetes, Clustered Wound: No Osteoarthritis, Neuropathy Photos Photo Uploaded By: Mikeal Hawthorne on 02/04/2020 14:28:41 Wound Measurements Length: (cm) 1.2 Width: (cm) 1.5 Depth: (cm) 0.1 Area: (cm) 1.414 Volume: (cm) 0.141 % Reduction in Area: 84.2% % Reduction in Volume: 97.8% Epithelialization: Small (1-33%) Tunneling: No Undermining: No Wound Description Classification: Grade 2 Wound Margin: Flat and Intact Exudate Amount: Medium Exudate Type: Serosanguineous Exudate Color: red, brown Foul Odor After Cleansing: No Slough/Fibrino No Wound Bed Granulation Amount: Large (67-100%) Exposed Structure Granulation Quality: Pink Fascia Exposed: No Necrotic Amount: None Present (0%) Fat Layer (Subcutaneous Tissue) Exposed: Yes Tendon Exposed: No Muscle Exposed: No Joint Exposed: No Bone Exposed: No Electronic Signature(s)  Signed: 01/30/2020 5:43:24 PM By: Baruch Gouty RN, BSN Signed: 01/30/2020 5:43:43 PM By: [name] RN, BSN Entered By: [name] on 01/30/2020  13:45:14 -------------------------------------------------------------------------------- Wound Assessment Details Patient Name: Date of Service: [name], [name] IG 01/30/2020 1:00 PM Medical Record Number: [id number] Patient Account Number: [id number] Date of Birth/Sex: Treating RN: Oct 03, 1961 (59 y.o. [name] Primary Care Neymar Dowe: Billey Gosling Other Clinician: Referring Takeshia Wenk: Treating Emanuella Nickle/Extender: [name] Weeks in Treatment: 13 Wound Status Wound Number: 2 Primary Diabetic Wound/Ulcer of the Lower Extremity Etiology: Wound Location: Right Metatarsal head second Wound Open Wounding Event: Gradually Appeared Status: Date Acquired: 02/16/2019 Comorbid Anemia, Sleep Apnea, Congestive Heart Failure, Hypertension, Weeks Of Treatment: 13 History: Peripheral Venous Disease, Cirrhosis , Type II Diabetes, Clustered Wound: No Osteoarthritis, Neuropathy Photos Photo Uploaded By: Mikeal Hawthorne on 02/04/2020 14:28:41 Wound Measurements Length: (cm) 2.6 Width: (cm) 2 Depth: (cm) 1.6 Area: (cm) 4.084 Volume: (cm) 6.535 % Reduction in Area: -85.7% % Reduction in Volume: -147.6% Epithelialization: Small (1-33%) Tunneling: No Undermining: No Wound Description Classification: Grade 2 Wound Margin: Flat and Intact Exudate Amount: Medium Exudate Type: Serosanguineous Exudate Color: red, brown Foul Odor After Cleansing: No Slough/Fibrino No Wound Bed Granulation Amount: Large (67-100%) Exposed Structure Granulation Quality: Pink Fascia Exposed: No Necrotic Amount: Small (1-33%) Fat Layer (Subcutaneous Tissue) Exposed: Yes Necrotic Quality: Adherent Slough Tendon Exposed: No Muscle Exposed: No Joint Exposed: No Bone Exposed: No Electronic Signature(s) Signed: 01/30/2020 5:43:24 PM By: Baruch Gouty RN, BSN Signed: 01/30/2020 5:43:43 PM By: [name] RN, BSN Entered By: [name] on 01/30/2020  13:45:39 -------------------------------------------------------------------------------- [place] Details Patient Name: Date of Service: [name] IG 01/30/2020 1:00 PM Medical Record Number: [id number] Patient Account Number: [id number] Date of Birth/Sex: Treating RN: 1961-09-06 (59 y.o. [name] Primary Care Shuntia Exton: Billey Gosling Other Clinician: Referring Janin Kozlowski: Treating Harinder Romas/Extender: [name] in Treatment: 13 Vital Signs Time Taken: 13:17 Temperature (F): 98.4 Height (in): 73 Pulse (bpm): 83 Weight (lbs): 260 Respiratory Rate (breaths/min): 18 Body Mass Index (BMI): 34.3 Blood Pressure (mmHg): 169/89 Reference Range: 80 - 120 mg / dl Electronic Signature(s) Signed: 01/30/2020 2:20:25 PM By: [name] Entered By: [name] on 01/30/2020 13:17:57

## 2020-01-30 NOTE — Progress Notes (Addendum)
[name], [name] ([id number]) Visit Report for 01/30/2020 Chief Complaint Document Details Patient Name: Date of Service: [name], [name] 01/30/2020 1:00 PM Medical Record [id number] Patient Account Number: [id number] Date of Birth/Sex: Treating RN: Jun 13, 1961 (59 y.o. [name] Primary Care Provider: [name] Other Clinician: Referring Provider: Treating Provider/Extender:Stone III, Dayton Martes, Duanne Limerick in Treatment: 13 Information Obtained from: Patient Chief Complaint Bilateral foot diabetic ulcers Electronic Signature(s) Signed: 01/30/2020 1:35:52 PM By: Lenda Kelp PA-C Entered By: Lenda Kelp on 01/30/2020 13:35:52 -------------------------------------------------------------------------------- Debridement Details Patient Name: Date of Service: [name], [name] 01/30/2020 1:00 PM Medical Record [id number] Patient Account Number: [id number] Date of Birth/Sex: Treating RN: 06-12-1961 (59 y.o. [name] Primary Care Provider: [name] Other Clinician: Referring Provider: Treating Provider/Extender:Stone III, Dayton Martes, Stacy Weeks in Treatment: 13 Debridement Performed for Wound #2 Right Metatarsal head second Assessment: Performed By: Physician Lenda Kelp, PA Debridement Type: Debridement Severity of Tissue Pre Fat layer exposed Debridement: Level of Consciousness (Pre- Awake and Alert procedure): Pre-procedure Verification/Time Out Taken: Yes - 13:55 Start Time: 13:57 Pain Control: Other : benzocaine, 20% Total Area Debrided (L x W): 2.6 (cm) x 1 (cm) = 2.6 (cm) Tissue and other material Non-Viable, Callus, Skin: Epidermis debrided: Level: Skin/Epidermis Debridement Description: Selective/Open Wound Instrument: Curette Bleeding: Minimum Hemostasis Achieved: Pressure End Time: 14:03 Procedural Pain: 0 Post Procedural Pain: 0 Response to Treatment: Procedure was tolerated well Level of Consciousness Awake and  Alert (Post-procedure): Post Debridement Measurements of Total Wound Length: (cm) 2.6 Width: (cm) 2 Depth: (cm) 0.1 Volume: (cm) 0.408 Character of Wound/Ulcer Post Improved Debridement: Severity of Tissue Post Debridement: Fat layer exposed Post Procedure Diagnosis Same as Pre-procedure Electronic Signature(s) Signed: 01/30/2020 4:44:47 PM By: Lenda Kelp PA-C Signed: 01/30/2020 5:43:24 PM By: Zenaida Deed RN, BSN Entered By: Zenaida Deed on 01/30/2020 14:01:02 -------------------------------------------------------------------------------- Debridement Details Patient Name: Date of Service: [name], [name] 01/30/2020 1:00 PM Medical Record [id number] Patient Account Number: [id number] Date of Birth/Sex: Treating RN: Mar 27, 1961 (59 y.o. [name] Primary Care Provider: [name] Other Clinician: Referring Provider: Treating Provider/Extender:Stone III, Dayton Martes, Stacy Weeks in Treatment: 13 Debridement Performed for Wound #1 Left,Plantar Foot Assessment: Performed By: Physician Lenda Kelp, PA Debridement Type: Debridement Severity of Tissue Pre Fat layer exposed Debridement: Level of Consciousness (Pre- Awake and Alert procedure): Pre-procedure Verification/Time Out Taken: Yes - 13:55 Start Time: 13:57 Pain Control: Other : benzocaine, 20% Total Area Debrided (L x W): 1.2 (cm) x 1 (cm) = 1.2 (cm) Tissue and other material Non-Viable, Callus, Skin: Epidermis Non-Viable, Callus, Skin: Epidermis debrided: Level: Skin/Epidermis Debridement Description: Selective/Open Wound Instrument: Curette Bleeding: Minimum Hemostasis Achieved: Pressure End Time: 14:03 Procedural Pain: 0 Post Procedural Pain: 0 Response to Treatment: Procedure was tolerated well Level of Consciousness Awake and Alert (Post-procedure): Post Debridement Measurements of Total Wound Length: (cm) 1.2 Width: (cm) 1.5 Depth: (cm) 0.1 Volume: (cm) 0.141 Character  of Wound/Ulcer Post Improved Debridement: Severity of Tissue Post Debridement: Fat layer exposed Post Procedure Diagnosis Same as Pre-procedure Electronic Signature(s) Signed: 01/30/2020 4:44:47 PM By: Lenda Kelp PA-C Signed: 01/30/2020 5:43:24 PM By: Zenaida Deed RN, BSN Entered By: Zenaida Deed on 01/30/2020 14:02:00 -------------------------------------------------------------------------------- HPI Details Patient Name: Date of Service: [name], [name] 01/30/2020 1:00 PM Medical Record [id number] Patient Account Number: [id number] Date of Birth/Sex: Treating RN: 1961-03-07 (59 y.o. [name] Primary Care Provider: [name] Other Clinician: Referring Provider: Treating Provider/Extender:Stone III, Dayton Martes, Stacy Weeks in Treatment: 13 History of Present Illness HPI  Description: 10/31/2019 upon evaluation today patient appears to be doing somewhat poorly in regard to his bilateral plantar feet. He has wounds that he tells me have been present since 2012 intermittently off and on. Most recently this has been open for at least the past 6 months to a year. He has been trying to treat this in different ways using Santyl along with various other dressings including Medihoney and even at one point Xeroform. Nothing really has seem to get this completely closed. He was recently in the hospital for cellulitis of his leg subsequently he did have x-rays as well as MRIs that showed negative for any signs of osteomyelitis in regard to the wounds on his feet. Fortunately there is no signs of systemic infection at this time. No fevers, chills, nausea, vomiting, or diarrhea. Patient has previously used Darco offloading shoes as far as frontal floaters as well as postop surgical shoes. He has never been in a total contact cast that may be something we need to strongly consider here. Patient's most recent hemoglobin A1c 1 month ago was 5.3 seems to be very well controlled  which is great. Subsequently he has seen vascular as well as podiatry. His ABIs are 1.07 on the left and 1.14 on the right he seems to be doing well he does have chronic venous stasis. 11/07/2019 upon evaluation today patient appears to be doing well with regard to his wounds all things considered. I do not see any severe worsening he still has some callus buildup on the right more than the left he notes that he has been probably more active than he should as far as walking is concerned is just very hard to not be active. He knows he needs to be more careful in this regard however. He is willing to give the cast a try at this point although he notes that he is a little nervous about this just with regard to balance although he will be very careful and obviously if he has any trouble he knows to contact the office and let me know. 1/22; patient is in for his obligatory first total contact cast change. Our intake nurse reported a very large amount of drainage which is spelled out over to the surrounding skin. Has bilateral diabetic foot wounds. He has Charcot feet. We have been using silver alginate on his wounds. 11/14/2019 on evaluation today patient is actually seeming to make good improvement in regard to his bilateral plantar foot wounds. We have been using a cast on the left side and on the right side he has been using dressings he is changing up his own accord. With that being said he tells me that he is also not walking as much just due to how unsteady he feels. He takes it easy when he does have to walk and when he does not have to walk he is resting. This is probably help in his right foot as well has the left foot which is actually measuring better. In fact both are measuring better. Overall I am very pleased with how things seem to be progressing. The patient does have some odor on the left foot this does have me concerned about the possibility of infection, and actually probably go ahead  and put him on antibiotics today as well as utilizing a continuation of the cast on the left foot I think that will be fine we probably just need to bring him in sooner to change this not last a whole week.  1/29; we brought the patient back today for a total contact cast change on the left out of concern for excessive drainage. We are using drawtex over the wound as the primary dressing 11/21/2019 on evaluation today patient appears to be doing well with regard to his left plantar foot. In fact both foot ulcers actually seem to be doing pretty well. Nonetheless he is having a lot of drainage on the left at this time and again we did obtain a wound culture did show positive for Staph aureus that was reviewed by myself today as well. Nonetheless he is on Bactrim which was shown to be sensitive that should be helping in this regard. Fortunately there is no signs of infection systemically at this point. 2/5; back in clinic today for a total contact cast change apparently secondary to very significant drainage. Still using drawtex 11/28/2019 upon evaluation today patient appears to be doing well with regard to his wounds. The right foot is doing okay as measured about the same in my opinion. The left foot is actually showing signs of significant improvement is measuring smaller there is a lot of hyper granulation likely due to the continued drainage at this point. We did obtain approval for a snap VAC I think that is good to be appropriate for him and will likely help this tremendously underneath the cast. He is definitely in agreement with proceeding with such. 2/12; patient came in today after his snap VAC lost suction. Brought in to see one of our nurses. The dressing was replaced and then we put the cast back on and rehooked up the snap VAC. Apparently his wound looked very good per our intake nurse. 2/15; again we replaced the cast on Friday. By Saturday the snap VAC and light suction. He called this  morning he comes in acutely. The wounds look fine however the VAC is not functioning. We replaced the cast using silver alginate as the primary dressing backed with Kerramax. The snap VAC was not replaced 12/05/2019 upon evaluation today patient appears to be doing better in regard to his left plantar foot ulcer. Fortunately there is no signs of active infection at this time. Unfortunately he is continuing to have issues with the right foot he is really not making any progress here things seem to be somewhat stagnant to be honest. The depth has increased but that is due to me having debrided the wound in the past based on what I am seeing. 12/12/2019 on evaluation today patient appears to be doing more poorly in regard to the left lower extremity. He has some erythema spreading up the side of his foot I am concerned about infection again at this point. Unfortunately he has been seeing improvement with a total contact cast but I do not think we should put that on today. On his right plantar foot he continues to have significant drainage this is actually measuring deeper I really do not feel like you are making any progress whatsoever. I have prescribed Granix for him unfortunately his insurance apparently was going to cost him a $500 co-pay. 12/19/2019 upon evaluation today patient actually appears to be doing better in regard to both wounds. With that being said he actually did get the reGranix which he had to pay $500 for. With that being said it does look like that he is actually made some improvement based on what I am seeing at this point with the reGranix. Obviously if he is going to continue this we are going  to do something about trying to get him some help in covering the cost. 12/26/2019 on evaluation today patient appears to be doing really much better even compared to last week. Overall the wound seems to be much better even compared to last week and last week was better than the week  before. Since has been using the reGranix his symptoms have improved significantly. With that being said the issue right now is simply that this is a very expensive medication for him the first dose cost him $500. Upon inspection patient's wound bed actually is however dramatically improved compared to before he started this 2 weeks ago. 01/02/2020 upon evaluation today patient appears to actually be doing well. He still had a little bit of reGranix left that has been using in small amounts he just been applying it every other day instead of every day in order to make it go longer. Overall we are still seeing excellent improvement he is measuring smaller looking better healthier tissue and everything seems to be pointing to this headed in the right direction. Fortunately there is no evidence of infection either which is also excellent news. He does have his MRI coming up within the next week. 01/09/2020 upon evaluation today patient appears to be doing a little worse today compared to previous week's evaluation. He is actually been out of the reGranix at this point. He has been trying to make the stretch out so he has been changing the dressings on a regular set schedule like he was previous. I think this has made a difference. Fortunately there is no signs of active infection at this time. No fevers, chills, nausea, vomiting, or diarrhea. 01/16/2020 upon evaluation today patient appears to be doing well with regard to his left plantar foot ulcer. The right plantar foot still shows some significant depth at this point. Fortunately there is no signs of active infection at this time. 01/30/2020 upon evaluation today patient appears to be doing about the same in regard to his right plantar foot ulcer there is still some depth here and we had to wait till he actually switched over to his new insurance to get approval for the MRI under his new insurance plan. With that being said he now has switched as of  April 1. Fortunately there is no signs of active infection at this time. Overall in regard to his left foot ulcer this seems to be doing much better and I am actually very pleased with how things are going. With that being said it is not quite as much progress as we were seeing with the reGranix but at the same time he has had trouble getting this apparently there is been some hindrance here. I Ernie Hew try to actually send this to [facility] that was recommended by the drug rep to me. Electronic Signature(s) Signed: 01/30/2020 2:07:27 PM By: Lenda Kelp PA-C Entered By: Lenda Kelp on 01/30/2020 14:07:27 -------------------------------------------------------------------------------- Physical Exam Details Patient Name: Date of Service: [name], [name] 01/30/2020 1:00 PM Medical Record [id number] Patient Account Number: [id number] Date of Birth/Sex: Treating RN: [birth year] (59 y.o. [name] Primary Care Provider: [name] Other Clinician: Referring Provider: Treating Provider/Extender:Stone III, Dayton Martes, Stacy Weeks in Treatment: 13 Constitutional Well-nourished and well-hydrated in no acute distress. Respiratory normal breathing without difficulty. Psychiatric this patient is able to make decisions and demonstrates good insight into disease process. Alert and Oriented x 3. pleasant and cooperative. Notes Upon inspection today patient's wounds do appear to be doing  well with the standpoint of the wound bed which shows good granulation. He is making some progress on the left really not much change on the right. No sharp debridement was noted or needed rather of the wound beds although I did have to remove callus from around the edges of the wound bilaterally. Electronic Signature(s) Signed: 01/30/2020 2:07:48 PM By: Lenda KelpStone III, Ziyan Schoon PA-C Entered By: Lenda KelpStone III, Mikaelyn Arthurs on 01/30/2020  14:07:48 -------------------------------------------------------------------------------- Physician Orders Details Patient Name: Date of Service: [name], [name] 01/30/2020 1:00 PM Medical Record [id number] Patient Account Number: [id number] Date of Birth/Sex: Treating RN: 03/23/1961 (59 y.o. Damaris SchoonerM) Boehlein, Linda Primary Care Provider: Cheryll CockayneBurns, Stacy Other Clinician: Referring Provider: Treating Provider/Extender:Stone III, Dayton MartesHoyt Burns, Duanne LimerickStacy Weeks in Treatment: 13 Verbal / Phone Orders: No Diagnosis Coding ICD-10 Coding Code Description E11.622 Type 2 diabetes mellitus with other skin ulcer L97.522 Non-pressure chronic ulcer of other part of left foot with fat layer exposed L97.512 Non-pressure chronic ulcer of other part of right foot with fat layer exposed I10 Essential (primary) hypertension I50.42 Chronic combined systolic (congestive) and diastolic (congestive) heart failure I87.2 Venous insufficiency (chronic) (peripheral) L84 Corns and callosities Follow-up Appointments Return Appointment in 2 weeks. Dressing Change Frequency Wound #1 Left,Plantar Foot Change Dressing every other day. Wound #2 Right Metatarsal head second Change Dressing every other day. Wound Cleansing Wound #1 Left,Plantar Foot May shower and wash wound with soap and water. Wound #2 Right Metatarsal head second May shower and wash wound with soap and water. Primary Wound Dressing Wound #1 Left,Plantar Foot Endoform - moisten with saline Wound #2 Right Metatarsal head second Endoform - moisten with saline Secondary Dressing Wound #1 Left,Plantar Foot Kerlix/Rolled Gauze Dry Gauze ABD pad Wound #2 Right Metatarsal head second Kerlix/Rolled Gauze Dry Gauze Drawtex - cut to fit inside wound margins to hold endoform in place (may need 2 layers to fill space) Other: - felt callous pad Edema Control Patient to wear own compression stockings - daily to both legs Avoid standing for long periods of  time Elevate legs to the level of the heart or above for 30 minutes daily and/or when sitting, a frequency of: Off-Loading Open toe surgical shoe to: - both feet Electronic Signature(s) Signed: 01/30/2020 4:44:47 PM By: Lenda KelpStone III, Vivianne Carles PA-C Signed: 01/30/2020 5:43:24 PM By: Zenaida DeedBoehlein, Linda RN, BSN Entered By: Zenaida DeedBoehlein, Linda on 01/30/2020 14:03:54 -------------------------------------------------------------------------------- Problem List Details Patient Name: Date of Service: [name], [name] 01/30/2020 1:00 PM Medical Record [id number] Patient Account Number: [id number] Date of Birth/Sex: Treating RN: 01/21/1961 (59 y.o. Damaris SchoonerM) Boehlein, Linda Primary Care Provider: Cheryll CockayneBurns, Stacy Other Clinician: Referring Provider: Treating Provider/Extender:Stone III, Dayton MartesHoyt Burns, Stacy Weeks in Treatment: 13 Active Problems ICD-10 Evaluated Encounter Code Description Active Date Today Diagnosis E11.622 Type 2 diabetes mellitus with other skin ulcer 10/31/2019 No Yes L97.522 Non-pressure chronic ulcer of other part of left foot 10/31/2019 No Yes with fat layer exposed L97.512 Non-pressure chronic ulcer of other part of right foot 10/31/2019 No Yes with fat layer exposed I10 Essential (primary) hypertension 10/31/2019 No Yes I50.42 Chronic combined systolic (congestive) and diastolic 10/31/2019 No Yes (congestive) heart failure I87.2 Venous insufficiency (chronic) (peripheral) 10/31/2019 No Yes L84 Corns and callosities 11/14/2019 No Yes Inactive Problems Resolved Problems Electronic Signature(s) Signed: 01/30/2020 1:35:46 PM By: Lenda KelpStone III, Sosha Shepherd PA-C Entered By: Lenda KelpStone III, Enrigue Hashimi on 01/30/2020 13:35:45 -------------------------------------------------------------------------------- Progress Note Details Patient Name: Date of Service: [name], [name] 01/30/2020 1:00 PM Medical Record [id number] Patient Account Number: [id number] Date of Birth/Sex: Treating RN: 11/10/1960 (59 y.o. Bayard HuggerM) Boehlein,  [name] Primary Care Provider: [name] Other Clinician: Referring Provider: Treating Provider/Extender:Stone III, Dayton Martes, Duanne Limerick in Treatment: 13 Subjective Chief Complaint Information obtained from Patient Bilateral foot diabetic ulcers History of Present Illness (HPI) 10/31/2019 upon evaluation today patient appears to be doing somewhat poorly in regard to his bilateral plantar feet. He has wounds that he tells me have been present since 2012 intermittently off and on. Most recently this has been open for at least the past 6 months to a year. He has been trying to treat this in different ways using Santyl along with various other dressings including Medihoney and even at one point Xeroform. Nothing really has seem to get this completely closed. He was recently in the hospital for cellulitis of his leg subsequently he did have x-rays as well as MRIs that showed negative for any signs of osteomyelitis in regard to the wounds on his feet. Fortunately there is no signs of systemic infection at this time. No fevers, chills, nausea, vomiting, or diarrhea. Patient has previously used Darco offloading shoes as far as frontal floaters as well as postop surgical shoes. He has never been in a total contact cast that may be something we need to strongly consider here. Patient's most recent hemoglobin A1c 1 month ago was 5.3 seems to be very well controlled which is great. Subsequently he has seen vascular as well as podiatry. His ABIs are 1.07 on the left and 1.14 on the right he seems to be doing well he does have chronic venous stasis. 11/07/2019 upon evaluation today patient appears to be doing well with regard to his wounds all things considered. I do not see any severe worsening he still has some callus buildup on the right more than the left he notes that he has been probably more active than he should as far as walking is concerned is just very hard to not be active. He knows he  needs to be more careful in this regard however. He is willing to give the cast a try at this point although he notes that he is a little nervous about this just with regard to balance although he will be very careful and obviously if he has any trouble he knows to contact the office and let me know. 1/22; patient is in for his obligatory first total contact cast change. Our intake nurse reported a very large amount of drainage which is spelled out over to the surrounding skin. Has bilateral diabetic foot wounds. He has Charcot feet. We have been using silver alginate on his wounds. 11/14/2019 on evaluation today patient is actually seeming to make good improvement in regard to his bilateral plantar foot wounds. We have been using a cast on the left side and on the right side he has been using dressings he is changing up his own accord. With that being said he tells me that he is also not walking as much just due to how unsteady he feels. He takes it easy when he does have to walk and when he does not have to walk he is resting. This is probably help in his right foot as well has the left foot which is actually measuring better. In fact both are measuring better. Overall I am very pleased with how things seem to be progressing. The patient does have some odor on the left foot this does have me concerned about the possibility of infection, and actually probably go ahead and put him on antibiotics today as well  as utilizing a continuation of the cast on the left foot I think that will be fine we probably just need to bring him in sooner to change this not last a whole week. 1/29; we brought the patient back today for a total contact cast change on the left out of concern for excessive drainage. We are using drawtex over the wound as the primary dressing 11/21/2019 on evaluation today patient appears to be doing well with regard to his left plantar foot. In fact both foot ulcers actually seem to be doing  pretty well. Nonetheless he is having a lot of drainage on the left at this time and again we did obtain a wound culture did show positive for Staph aureus that was reviewed by myself today as well. Nonetheless he is on Bactrim which was shown to be sensitive that should be helping in this regard. Fortunately there is no signs of infection systemically at this point. 2/5; back in clinic today for a total contact cast change apparently secondary to very significant drainage. Still using drawtex 11/28/2019 upon evaluation today patient appears to be doing well with regard to his wounds. The right foot is doing okay as measured about the same in my opinion. The left foot is actually showing signs of significant improvement is measuring smaller there is a lot of hyper granulation likely due to the continued drainage at this point. We did obtain approval for a snap VAC I think that is good to be appropriate for him and will likely help this tremendously underneath the cast. He is definitely in agreement with proceeding with such. 2/12; patient came in today after his snap VAC lost suction. Brought in to see one of our nurses. The dressing was replaced and then we put the cast back on and rehooked up the snap VAC. Apparently his wound looked very good per our intake nurse. 2/15; again we replaced the cast on Friday. By Saturday the snap VAC and light suction. He called this morning he comes in acutely. The wounds look fine however the VAC is not functioning. We replaced the cast using silver alginate as the primary dressing backed with Kerramax. The snap VAC was not replaced 12/05/2019 upon evaluation today patient appears to be doing better in regard to his left plantar foot ulcer. Fortunately there is no signs of active infection at this time. Unfortunately he is continuing to have issues with the right foot he is really not making any progress here things seem to be somewhat stagnant to be honest. The  depth has increased but that is due to me having debrided the wound in the past based on what I am seeing. 12/12/2019 on evaluation today patient appears to be doing more poorly in regard to the left lower extremity. He has some erythema spreading up the side of his foot I am concerned about infection again at this point. Unfortunately he has been seeing improvement with a total contact cast but I do not think we should put that on today. On his right plantar foot he continues to have significant drainage this is actually measuring deeper I really do not feel like you are making any progress whatsoever. I have prescribed Granix for him unfortunately his insurance apparently was going to cost him a $500 co-pay. 12/19/2019 upon evaluation today patient actually appears to be doing better in regard to both wounds. With that being said he actually did get the reGranix which he had to pay $500 for. With that being  said it does look like that he is actually made some improvement based on what I am seeing at this point with the reGranix. Obviously if he is going to continue this we are going to do something about trying to get him some help in covering the cost. 12/26/2019 on evaluation today patient appears to be doing really much better even compared to last week. Overall the wound seems to be much better even compared to last week and last week was better than the week before. Since has been using the reGranix his symptoms have improved significantly. With that being said the issue right now is simply that this is a very expensive medication for him the first dose cost him $500. Upon inspection patient's wound bed actually is however dramatically improved compared to before he started this 2 weeks ago. 01/02/2020 upon evaluation today patient appears to actually be doing well. He still had a little bit of reGranix left that has been using in small amounts he just been applying it every other day instead of  every day in order to make it go longer. Overall we are still seeing excellent improvement he is measuring smaller looking better healthier tissue and everything seems to be pointing to this headed in the right direction. Fortunately there is no evidence of infection either which is also excellent news. He does have his MRI coming up within the next week. 01/09/2020 upon evaluation today patient appears to be doing a little worse today compared to previous week's evaluation. He is actually been out of the reGranix at this point. He has been trying to make the stretch out so he has been changing the dressings on a regular set schedule like he was previous. I think this has made a difference. Fortunately there is no signs of active infection at this time. No fevers, chills, nausea, vomiting, or diarrhea. 01/16/2020 upon evaluation today patient appears to be doing well with regard to his left plantar foot ulcer. The right plantar foot still shows some significant depth at this point. Fortunately there is no signs of active infection at this time. 01/30/2020 upon evaluation today patient appears to be doing about the same in regard to his right plantar foot ulcer there is still some depth here and we had to wait till he actually switched over to his new insurance to get approval for the MRI under his new insurance plan. With that being said he now has switched as of April 1. Fortunately there is no signs of active infection at this time. Overall in regard to his left foot ulcer this seems to be doing much better and I am actually very pleased with how things are going. With that being said it is not quite as much progress as we were seeing with the reGranix but at the same time he has had trouble getting this apparently there is been some hindrance here. I Ernie Hew try to actually send this to [facility] that was recommended by the drug rep to me. Objective Constitutional Well-nourished and  well-hydrated in no acute distress. Vitals Time Taken: 1:17 PM, Height: 73 in, Weight: 260 lbs, BMI: 34.3, Temperature: 98.4 F, Pulse: 83 bpm, Respiratory Rate: 18 breaths/min, Blood Pressure: 169/89 mmHg. Respiratory normal breathing without difficulty. Psychiatric this patient is able to make decisions and demonstrates good insight into disease process. Alert and Oriented x 3. pleasant and cooperative. General Notes: Upon inspection today patient's wounds do appear to be doing well with the standpoint of the  wound bed which shows good granulation. He is making some progress on the left really not much change on the right. No sharp debridement was noted or needed rather of the wound beds although I did have to remove callus from around the edges of the wound bilaterally. Integumentary (Hair, Skin) Wound #1 status is Open. Original cause of wound was Gradually Appeared. The wound is located on the [place]. The wound measures 1.2cm length x 1.5cm width x 0.1cm depth; 1.414cm^2 area and 0.141cm^3 volume. There is Fat Layer (Subcutaneous Tissue) Exposed exposed. There is no tunneling or undermining noted. There is a medium amount of serosanguineous drainage noted. The wound margin is flat and intact. There is large (67-100%) pink granulation within the wound bed. There is no necrotic tissue within the wound bed. Wound #2 status is Open. Original cause of wound was Gradually Appeared. The wound is located on the Right Metatarsal head second. The wound measures 2.6cm length x 2cm width x 1.6cm depth; 4.084cm^2 area and 6.535cm^3 volume. There is Fat Layer (Subcutaneous Tissue) Exposed exposed. There is no tunneling or undermining noted. There is a medium amount of serosanguineous drainage noted. The wound margin is flat and intact. There is large (67-100%) pink granulation within the wound bed. There is a small (1-33%) amount of necrotic tissue within the wound bed including Adherent  Slough. Assessment Active Problems ICD-10 Type 2 diabetes mellitus with other skin ulcer Non-pressure chronic ulcer of other part of left foot with fat layer exposed Non-pressure chronic ulcer of other part of right foot with fat layer exposed Essential (primary) hypertension Chronic combined systolic (congestive) and diastolic (congestive) heart failure Venous insufficiency (chronic) (peripheral) Corns and callosities Procedures Wound #1 Pre-procedure diagnosis of Wound #1 is a Diabetic Wound/Ulcer of the Lower Extremity located on the Left,Plantar Foot .Severity of Tissue Pre Debridement is: Fat layer exposed. There was a Selective/Open Wound Skin/Epidermis Debridement with a total area of 1.2 sq cm performed by Worthy Keeler, PA. With the following instrument(s): Curette to remove Non-Viable tissue/material. Material removed includes Callus and Skin: Epidermis and after achieving pain control using Other (benzocaine, 20%). No specimens were taken. A time out was conducted at 13:55, prior to the start of the procedure. A Minimum amount of bleeding was controlled with Pressure. The procedure was tolerated well with a pain level of 0 throughout and a pain level of 0 following the procedure. Post Debridement Measurements: 1.2cm length x 1.5cm width x 0.1cm depth; 0.141cm^3 volume. Character of Wound/Ulcer Post Debridement is improved. Severity of Tissue Post Debridement is: Fat layer exposed. Post procedure Diagnosis Wound #1: Same as Pre-Procedure Wound #2 Pre-procedure diagnosis of Wound #2 is a Diabetic Wound/Ulcer of the Lower Extremity located on the Right Metatarsal head second .Severity of Tissue Pre Debridement is: Fat layer exposed. There was a Selective/Open Wound Skin/Epidermis Debridement with a total area of 2.6 sq cm performed by Worthy Keeler, PA. With the following instrument(s): Curette to remove Non-Viable tissue/material. Material removed includes Callus and  Skin: Epidermis and after achieving pain control using Other (benzocaine, 20%). No specimens were taken. A time out was conducted at 13:55, prior to the start of the procedure. A Minimum amount of bleeding was controlled with Pressure. The procedure was tolerated well with a pain level of 0 throughout and a pain level of 0 following the procedure. Post Debridement Measurements: 2.6cm length x 2cm width x 0.1cm depth; 0.408cm^3 volume. Character of Wound/Ulcer Post Debridement is improved. Severity of Tissue  Post Debridement is: Fat layer exposed. Post procedure Diagnosis Wound #2: Same as Pre-Procedure Plan Follow-up Appointments: Return Appointment in 2 weeks. Dressing Change Frequency: Wound #1 Left,Plantar Foot: Change Dressing every other day. Wound #2 Right Metatarsal head second: Change Dressing every other day. Wound Cleansing: Wound #1 Left,Plantar Foot: May shower and wash wound with soap and water. Wound #2 Right Metatarsal head second: May shower and wash wound with soap and water. Primary Wound Dressing: Wound #1 Left,Plantar Foot: Endoform - moisten with saline Wound #2 Right Metatarsal head second: Endoform - moisten with saline Secondary Dressing: Wound #1 Left,Plantar Foot: Kerlix/Rolled Gauze Dry Gauze ABD pad Wound #2 Right Metatarsal head second: Kerlix/Rolled Gauze Dry Gauze Drawtex - cut to fit inside wound margins to hold endoform in place (may need 2 layers to fill space) Other: - felt callous pad Edema Control: Patient to wear own compression stockings - daily to both legs Avoid standing for long periods of time Elevate legs to the level of the heart or above for 30 minutes daily and/or when sitting, a frequency of: Off-Loading: Open toe surgical shoe to: - both feet 1. My suggestion currently is good to be that we go ahead and continue with the wound care measures as before which includes utilization of the endoform. That seems to have done decently  well for him. 2. I am also can recommend that we go ahead and continue to cover the area with an ABD pad and roll gauze to secure in place. 3. I am also going to suggest at this time that the patient continue with appropriate offloading in order to avoid any damage to his wounds. 4. We are going to go ahead and resubmit the authorization for his MRI of the right foot again this wound is much deeper than the left foot which seems to be doing better at this point. We will see patient back for reevaluation in 2 weeks here in the clinic. If anything worsens or changes patient will contact our office for additional recommendations. Electronic Signature(s) Signed: 01/30/2020 2:10:24 PM By: Lenda Kelp PA-C Entered By: Lenda Kelp on 01/30/2020 14:10:24 -------------------------------------------------------------------------------- SuperBill Details Patient Name: Date of Service: [name], [name] 01/30/2020 Medical Record [id number] Patient Account Number: [id number] Date of Birth/Sex: Treating RN: July 23, 1961 (59 y.o. [name] Primary Care Provider: [name] Other Clinician: Referring Provider: Treating Provider/Extender:Stone III, Dayton Martes, Stacy Weeks in Treatment: 13 Diagnosis Coding ICD-10 Codes Code Description E11.622 Type 2 diabetes mellitus with other skin ulcer L97.522 Non-pressure chronic ulcer of other part of left foot with fat layer exposed L97.512 Non-pressure chronic ulcer of other part of right foot with fat layer exposed I10 Essential (primary) hypertension I50.42 Chronic combined systolic (congestive) and diastolic (congestive) heart failure I87.2 Venous insufficiency (chronic) (peripheral) L84 Corns and callosities Facility Procedures CPT4 Code Description: [id number] - DEBRIDE WOUND 1ST 20 SQ CM OR < ICD-10 Diagnosis Description L97.522 Non-pressure chronic ulcer of other part of left foot with L97.512 Non-pressure chronic ulcer of other  part of right foot with Modifier: fat layer exp fat layer ex Quantity: 1 osed posed Physician Procedures CPT4 Code Description: [id number] - WC PHYS DEBR WO ANESTH 20 SQ CM ICD-10 Diagnosis Description L97.522 Non-pressure chronic ulcer of other part of left foot with L97.512 Non-pressure chronic ulcer of other part of right foot with Modifier: fat layer exp fat layer ex Quantity: 1 osed posed Electronic Signature(s) Signed: 01/30/2020 2:15:37 PM By: Lenda Kelp PA-C Entered By: Larina Bras  IIILeonard Schwartz on 01/30/2020 14:15:37

## 2020-02-05 ENCOUNTER — Telehealth: Payer: Self-pay | Admitting: Internal Medicine

## 2020-02-05 DIAGNOSIS — G4733 Obstructive sleep apnea (adult) (pediatric): Secondary | ICD-10-CM

## 2020-02-05 NOTE — Telephone Encounter (Signed)
New message:   Pt is calling to request a prescription for a portable oxygen machine for when he travels. Please advise.

## 2020-02-05 NOTE — Telephone Encounter (Signed)
    Patient calling to request portable oxygen be ordered. Patient states he also has [employer], molecular supplemental plan. (phone [phone])

## 2020-02-06 NOTE — Telephone Encounter (Signed)
Would this need an OV? 

## 2020-02-06 NOTE — Telephone Encounter (Signed)
Does he just use this at night?  Not sure what portable tank will last long enough.will likely need a visit for documentation to support need for insurance - may be best to see pulmonary which is better equipped to make sure he has the right tank

## 2020-02-07 ENCOUNTER — Other Ambulatory Visit: Payer: Self-pay | Admitting: Internal Medicine

## 2020-02-07 NOTE — Telephone Encounter (Signed)
Can you call his oxygen supplier and see if they have a portable tank that would fit his needs

## 2020-02-07 NOTE — Telephone Encounter (Signed)
Pt states he just used it at night. States that it was originallyprescribed by you. He just needs a portable concentrator that he is able to carry with him out of town.The one he has now is way too be to transport if needed.

## 2020-02-07 NOTE — Telephone Encounter (Signed)
He does not know who he gets his oxygen from.

## 2020-02-08 NOTE — Telephone Encounter (Signed)
Pt aware and expressed understanding.  

## 2020-02-08 NOTE — Telephone Encounter (Signed)
Referral ordered for pulmonary.  The oxygen was started when he was in the hospital last summer.  He likely needs to be evaluated if he still requires the oxygen, especially to get a different tank.  Ideally he should also discuss with pulmonary other options for the sleep apnea besides CPAP.  There are other options at this time.

## 2020-02-08 NOTE — Telephone Encounter (Signed)
Tried calling pt 2 times and could not hear him.

## 2020-02-11 ENCOUNTER — Ambulatory Visit: Payer: Medicare Other | Attending: Internal Medicine

## 2020-02-11 DIAGNOSIS — Z23 Encounter for immunization: Secondary | ICD-10-CM

## 2020-02-11 NOTE — Progress Notes (Signed)
   Covid-19 Vaccination Clinic  Name:  Jaqwon Manfred    MRN: [id number] DOB: June 05, 1961  02/11/2020  Mr. Lebarron was observed post Covid-19 immunization for 15 minutes without incident. He was provided with Vaccine Information Sheet and instruction to access the V-Safe system.   Mr. Prettyman was instructed to call 911 with any severe reactions post vaccine: Marland Kitchen Difficulty breathing  . Swelling of face and throat  . A fast heartbeat  . A bad rash all over body  . Dizziness and weakness   Immunizations Administered    Name Date Dose VIS Date Route   Pfizer COVID-19 Vaccine 02/11/2020  3:15 PM 0.3 mL 12/12/2018 Intramuscular   Manufacturer: [employer], [employer]   Lot: [id number]   NDC: [address]-9

## 2020-02-12 ENCOUNTER — Ambulatory Visit (HOSPITAL_COMMUNITY)
Admission: RE | Admit: 2020-02-12 | Discharge: 2020-02-12 | Disposition: A | Discharge status: Home / Self Care | Payer: Medicare Other | Source: Ambulatory Visit | Attending: Physician Assistant | Admitting: Physician Assistant

## 2020-02-12 ENCOUNTER — Other Ambulatory Visit: Payer: Self-pay

## 2020-02-12 DIAGNOSIS — L97512 Non-pressure chronic ulcer of other part of right foot with fat layer exposed: Secondary | ICD-10-CM

## 2020-02-12 MED ORDER — GADOBUTROL 1 MMOL/ML IV SOLN
10.0000 mL | Freq: Once | INTRAVENOUS | Status: AC | PRN
  Administered 2020-02-12: 10 mL via INTRAVENOUS

## 2020-02-13 ENCOUNTER — Encounter (HOSPITAL_BASED_OUTPATIENT_CLINIC_OR_DEPARTMENT_OTHER): Payer: Medicare Other | Admitting: Physician Assistant

## 2020-02-13 DIAGNOSIS — E11622 Type 2 diabetes mellitus with other skin ulcer: Secondary | ICD-10-CM | POA: Diagnosis not present

## 2020-02-13 NOTE — Progress Notes (Addendum)
[name], [name] ([id number]) Visit Report for 02/13/2020 Chief Complaint Document Details Patient Name: Date of Service: GILBERT, MANOLIS 02/13/2020 1:30 PM Medical Record Number: [id number] Patient Account Number: [id number] Date of Birth/Sex: Treating RN: Aug 30, 1961 (59 y.o. [name] Primary Care Provider: [name] Other Clinician: Referring Provider: Treating Provider/Extender: [name] in Treatment: 15 Information Obtained from: Patient Chief Complaint Bilateral foot diabetic ulcers Electronic Signature(s) Signed: 02/13/2020 1:42:45 PM By: Lenda Kelp PA-C Entered By: Lenda Kelp on 02/13/2020 13:42:45 -------------------------------------------------------------------------------- HPI Details Patient Name: Date of Service: [name], [name] 02/13/2020 1:30 PM Medical Record Number: [id number] Patient Account Number: [id number] Date of Birth/Sex: Treating RN: 09/26/1961 (59 y.o. [name] Primary Care Provider: [name] Other Clinician: Referring Provider: Treating Provider/Extender: [name] in Treatment: 15 History of Present Illness HPI Description: 10/31/2019 upon evaluation today patient appears to be doing somewhat poorly in regard to his bilateral plantar feet. He has wounds that he tells me have been present since 2012 intermittently off and on. Most recently this has been open for at least the past 6 months to a year. He has been trying to treat this in different ways using Santyl along with various other dressings including Medihoney and even at one point Xeroform. Nothing really has seem to get this completely closed. He was recently in the hospital for cellulitis of his leg subsequently he did have x-rays as well as MRIs that showed negative for any signs of osteomyelitis in regard to the wounds on his feet. Fortunately there is no signs of systemic infection at this time. No fevers, chills,  nausea, vomiting, or diarrhea. Patient has previously used Darco offloading shoes as far as frontal floaters as well as postop surgical shoes. He has never been in a total contact cast that may be something we need to strongly consider here. Patient's most recent hemoglobin A1c 1 month ago was 5.3 seems to be very well controlled which is great. Subsequently he has seen vascular as well as podiatry. His ABIs are 1.07 on the left and 1.14 on the right he seems to be doing well he does have chronic venous stasis. 11/07/2019 upon evaluation today patient appears to be doing well with regard to his wounds all things considered. I do not see any severe worsening he still has some callus buildup on the right more than the left he notes that he has been probably more active than he should as far as walking is concerned is just very hard to not be active. He knows he needs to be more careful in this regard however. He is willing to give the cast a try at this point although he notes that he is a little nervous about this just with regard to balance although he will be very careful and obviously if he has any trouble he knows to contact the office and let me know. 1/22; patient is in for his obligatory first total contact cast change. Our intake nurse reported a very large amount of drainage which is spelled out over to the surrounding skin. Has bilateral diabetic foot wounds. He has Charcot feet. We have been using silver alginate on his wounds. 11/14/2019 on evaluation today patient is actually seeming to make good improvement in regard to his bilateral plantar foot wounds. We have been using a cast on the left side and on the right side he has been using dressings he is changing up his own accord.  With that being said he tells me that he is also not walking as much just due to how unsteady he feels. He takes it easy when he does have to walk and when he does not have to walk he is resting. This is  probably help in his right foot as well has the left foot which is actually measuring better. In fact both are measuring better. Overall I am very pleased with how things seem to be progressing. The patient does have some odor on the left foot this does have me concerned about the possibility of infection, and actually probably go ahead and put him on antibiotics today as well as utilizing a continuation of the cast on the left foot I think that will be fine we probably just need to bring him in sooner to change this not last a whole week. 1/29; we brought the patient back today for a total contact cast change on the left out of concern for excessive drainage. We are using drawtex over the wound as the primary dressing 11/21/2019 on evaluation today patient appears to be doing well with regard to his left plantar foot. In fact both foot ulcers actually seem to be doing pretty well. Nonetheless he is having a lot of drainage on the left at this time and again we did obtain a wound culture did show positive for Staph aureus that was reviewed by myself today as well. Nonetheless he is on Bactrim which was shown to be sensitive that should be helping in this regard. Fortunately there is no signs of infection systemically at this point. 2/5; back in clinic today for a total contact cast change apparently secondary to very significant drainage. Still using drawtex 11/28/2019 upon evaluation today patient appears to be doing well with regard to his wounds. The right foot is doing okay as measured about the same in my opinion. The left foot is actually showing signs of significant improvement is measuring smaller there is a lot of hyper granulation likely due to the continued drainage at this point. We did obtain approval for a snap VAC I think that is good to be appropriate for him and will likely help this tremendously underneath the cast. He is definitely in agreement with proceeding with such. 2/12; patient  came in today after his snap VAC lost suction. Brought in to see one of our nurses. The dressing was replaced and then we put the cast back on and rehooked up the snap VAC. Apparently his wound looked very good per our intake nurse. 2/15; again we replaced the cast on Friday. By Saturday the snap VAC and light suction. He called this morning he comes in acutely. The wounds look fine however the VAC is not functioning. We replaced the cast using silver alginate as the primary dressing backed with Kerramax. The snap VAC was not replaced 12/05/2019 upon evaluation today patient appears to be doing better in regard to his left plantar foot ulcer. Fortunately there is no signs of active infection at this time. Unfortunately he is continuing to have issues with the right foot he is really not making any progress here things seem to be somewhat stagnant to be honest. The depth has increased but that is due to me having debrided the wound in the past based on what I am seeing. 12/12/2019 on evaluation today patient appears to be doing more poorly in regard to the left lower extremity. He has some erythema spreading up the side of his foot  I am concerned about infection again at this point. Unfortunately he has been seeing improvement with a total contact cast but I do not think we should put that on today. On his right plantar foot he continues to have significant drainage this is actually measuring deeper I really do not feel like you are making any progress whatsoever. I have prescribed Granix for him unfortunately his insurance apparently was going to cost him a $500 co-pay. 12/19/2019 upon evaluation today patient actually appears to be doing better in regard to both wounds. With that being said he actually did get the reGranix which he had to pay $500 for. With that being said it does look like that he is actually made some improvement based on what I am seeing at this point with the reGranix. Obviously if he  is going to continue this we are going to do something about trying to get him some help in covering the cost. 12/26/2019 on evaluation today patient appears to be doing really much better even compared to last week. Overall the wound seems to be much better even compared to last week and last week was better than the week before. Since has been using the reGranix his symptoms have improved significantly. With that being said the issue right now is simply that this is a very expensive medication for him the first dose cost him $500. Upon inspection patient's wound bed actually is however dramatically improved compared to before he started this 2 weeks ago. 01/02/2020 upon evaluation today patient appears to actually be doing well. He still had a little bit of reGranix left that has been using in small amounts he just been applying it every other day instead of every day in order to make it go longer. Overall we are still seeing excellent improvement he is measuring smaller looking better healthier tissue and everything seems to be pointing to this headed in the right direction. Fortunately there is no evidence of infection either which is also excellent news. He does have his MRI coming up within the next week. 01/09/2020 upon evaluation today patient appears to be doing a little worse today compared to previous week's evaluation. He is actually been out of the reGranix at this point. He has been trying to make the stretch out so he has been changing the dressings on a regular set schedule like he was previous. I think this has made a difference. Fortunately there is no signs of active infection at this time. No fevers, chills, nausea, vomiting, or diarrhea. 01/16/2020 upon evaluation today patient appears to be doing well with regard to his left plantar foot ulcer. The right plantar foot still shows some significant depth at this point. Fortunately there is no signs of active infection at this  time. 01/30/2020 upon evaluation today patient appears to be doing about the same in regard to his right plantar foot ulcer there is still some depth here and we had to wait till he actually switched over to his new insurance to get approval for the MRI under his new insurance plan. With that being said he now has switched as of April 1. Fortunately there is no signs of active infection at this time. Overall in regard to his left foot ulcer this seems to be doing much better and I am actually very pleased with how things are going. With that being said it is not quite as much progress as we were seeing with the reGranix but at the same time he  has had trouble getting this apparently there is been some hindrance here. I Ernie Hew try to actually send this to [facility] that was recommended by the drug rep to me. 02/13/20 upon evaluation today patient appears to be doing better in regard to his left foot ulcer this is great news. Unfortunately the right foot ulcer is not really significantly better at this time. There is no signs of active infection systemically though he did have his MRI which showed unfortunately he does have infection noted including an abscess in the foot. There is also marrow changes noted which are consistent with osteomyelitis based on the radiology review and interpretation. Unfortunately considering that the wound is not really making the progress that we will he would like to be seen I think that this is an indication that he may need some further referral both infectious disease as well as potentially to podiatrist to see if there is anything that can be done to help with the situation that were dealing with here. The left foot again is doing great. Electronic Signature(s) Signed: 02/13/2020 2:55:39 PM By: Lenda Kelp PA-C Entered By: Lenda Kelp on 02/13/2020 14:55:39 -------------------------------------------------------------------------------- Physical Exam  Details Patient Name: Date of Service: [name], [name] 02/13/2020 1:30 PM Medical Record Number: [id number] Patient Account Number: [id number] Date of Birth/Sex: Treating RN: [birth year] (59 y.o. [name] Primary Care Provider: [name] Other Clinician: Referring Provider: Treating Provider/Extender: Atlee Abide Weeks in Treatment: 15 Constitutional Obese and well-hydrated in no acute distress. Respiratory normal breathing without difficulty. Psychiatric this patient is able to make decisions and demonstrates good insight into disease process. Alert and Oriented x 3. pleasant and cooperative. Notes Patient's wounds currently on the left foot again is measuring smaller and looking much better and very pleased. Unfortunately on the right foot this is still quite deep and seems to be still giving him some trouble here as far as healing is concerned. He has made a little bit of progress but overall I am still concerned about the fact that there is an obvious abscess on MRI as well as changes in the bone marrow consistent with osteomyelitis with the extensive cellulitis noted as well. This is despite the fact the patient has been on antibiotic therapy multiple times since have been taking care of him. Electronic Signature(s) Signed: 02/13/2020 2:56:18 PM By: Lenda Kelp PA-C Entered By: Lenda Kelp on 02/13/2020 14:56:17 -------------------------------------------------------------------------------- Physician Orders Details Patient Name: Date of Service: JACK, BOLIO [name] 02/13/2020 1:30 PM Medical Record Number: [id number] Patient Account Number: [id number] Date of Birth/Sex: Treating RN: 1961-01-27 (59 y.o. [name] Primary Care Provider: [name] Other Clinician: Referring Provider: Treating Provider/Extender: [name] in Treatment: 15 Verbal / Phone Orders: No Diagnosis Coding ICD-10 Coding Code  Description E11.622 Type 2 diabetes mellitus with other skin ulcer L97.522 Non-pressure chronic ulcer of other part of left foot with fat layer exposed L97.512 Non-pressure chronic ulcer of other part of right foot with fat layer exposed I10 Essential (primary) hypertension I50.42 Chronic combined systolic (congestive) and diastolic (congestive) heart failure I87.2 Venous insufficiency (chronic) (peripheral) L84 Corns and callosities Follow-up Appointments Return Appointment in 2 weeks. Dressing Change Frequency Wound #1 Left,Plantar Foot Change Dressing every other day. Wound #2 Right Metatarsal head second Change Dressing every other day. Wound Cleansing Wound #1 Left,Plantar Foot May shower and wash wound with soap and water. Wound #2 Right Metatarsal head second May shower and wash  wound with soap and water. Primary Wound Dressing Wound #1 Left,Plantar Foot Endoform - moisten with saline Wound #2 Right Metatarsal head second Endoform - moisten with saline Secondary Dressing Wound #1 Left,Plantar Foot Kerlix/Rolled Gauze Dry Gauze ABD pad Wound #2 Right Metatarsal head second Kerlix/Rolled Gauze Dry Gauze Drawtex - cut to fit inside wound margins to hold endoform in place (may need 2 layers to fill space) Other: - felt callous pad Edema Control Patient to wear own compression stockings - daily to both legs Avoid standing for long periods of time Elevate legs to the level of the heart or above for 30 minutes daily and/or when sitting, a frequency of: Off-Loading Open toe surgical shoe to: - both feet Consults Infectious Disease - Osteomyelitis and abscess right foot, non healing wound on plantar foot - (ICD10 E11.622 - Type 2 diabetes mellitus with other skin ulcer) nkle) - Dr. Logan Bores - non healing diabetic foot ulcers, Osteomyelitis and abscess right foot - (ICD10 E11.622 - Type 2 Podiatry ([facility] diabetes mellitus with other skin ulcer) Patient  Medications llergies: Claritin A Notifications Medication Indication Start End 02/13/2020 Augmentin DOSE 1 - oral 875 mg-125 mg tablet - 1 tablet oral taken 2 times per day for 14 days Electronic Signature(s) Signed: 02/13/2020 2:58:49 PM By: Lenda Kelp PA-C Entered By: Lenda Kelp on 02/13/2020 14:58:48 Prescription 02/13/2020 -------------------------------------------------------------------------------- Darin Engels PA Patient Name: Provider: [birth year] [id number] Date of Birth: NPI#: [id number] Sex: DEA #: [phone] Phone #: License #: Edyth Gunnels [facility] Patient Address: [address] [place], [place] [address] [address] [place], [place] [address] [phone] Allergies Name Reaction Severity Claritin anxiety Moderate Provider's Orders Infectious Disease - ICD10: E11.622 - Osteomyelitis and abscess right foot, non healing wound on plantar foot Hand Signature: Date(s): Prescription 02/13/2020 Darin Engels PA Patient Name: Provider: 01-Jul-1961 [id number] Date of Birth: NPI#: Judie Petit [id number] Sex: DEA #: [phone] Phone #: License #: Eligha Bridegroom [facility] Patient Address: [phone] [facility] [address] [place], [place] [address] [address] [place], [place] [address] [phone] Allergies Name Reaction Severity Claritin anxiety Moderate Provider's Orders nkle) - ICD10: E11.622 - Dr. Logan Bores - non healing diabetic foot ulcers, Osteomyelitis and abscess right foot Podiatry ([facility] Hand Signature: Date(s): Electronic Signature(s) Signed: 02/15/2020 9:02:44 AM By: Lenda Kelp PA-C Entered By: Lenda Kelp on 02/13/2020 14:58:50 -------------------------------------------------------------------------------- Problem List Details Patient Name: Date of Service: LENWOOD, BALSAM [name] 02/13/2020 1:30 PM Medical Record Number:  [id number] Patient Account Number: [id number] Date of Birth/Sex: Treating RN: November 26, 1960 (59 y.o. [name] Primary Care Provider: [name] Other Clinician: Referring Provider: Treating Provider/Extender: [name] in Treatment: 15 Active Problems ICD-10 Encounter Code Description Active Date MDM Diagnosis E11.622 Type 2 diabetes mellitus with other skin ulcer 10/31/2019 No Yes L97.522 Non-pressure chronic ulcer of other part of left foot with fat layer exposed 10/31/2019 No Yes L97.512 Non-pressure chronic ulcer of other part of right foot with fat layer exposed 10/31/2019 No Yes I10 Essential (primary) hypertension 10/31/2019 No Yes I50.42 Chronic combined systolic (congestive) and diastolic (congestive) heart failure 10/31/2019 No Yes I87.2 Venous insufficiency (chronic) (peripheral) 10/31/2019 No Yes L84 Corns and callosities 11/14/2019 No Yes Inactive Problems Resolved Problems Electronic Signature(s) Signed: 02/13/2020 1:42:36 PM By: Lenda Kelp PA-C Entered By: Lenda Kelp on 02/13/2020 13:42:36 -------------------------------------------------------------------------------- Progress Note Details  Patient Name: Date of Service: [name], [name] 02/13/2020 1:30 PM Medical Record Number: [id number] Patient Account Number: [id number] Date of Birth/Sex: Treating RN: 10-19-1960 (59 y.o. [name] Primary Care Provider: [name] Other Clinician: Referring Provider: Treating Provider/Extender: [name] in Treatment: 15 Subjective Chief Complaint Information obtained from Patient Bilateral foot diabetic ulcers History of Present Illness (HPI) 10/31/2019 upon evaluation today patient appears to be doing somewhat poorly in regard to his bilateral plantar feet. He has wounds that he tells me have been present since 2012 intermittently off and on. Most recently this has been open for at least the past 6 months  to a year. He has been trying to treat this in different ways using Santyl along with various other dressings including Medihoney and even at one point Xeroform. Nothing really has seem to get this completely closed. He was recently in the hospital for cellulitis of his leg subsequently he did have x-rays as well as MRIs that showed negative for any signs of osteomyelitis in regard to the wounds on his feet. Fortunately there is no signs of systemic infection at this time. No fevers, chills, nausea, vomiting, or diarrhea. Patient has previously used Darco offloading shoes as far as frontal floaters as well as postop surgical shoes. He has never been in a total contact cast that may be something we need to strongly consider here. Patient's most recent hemoglobin A1c 1 month ago was 5.3 seems to be very well controlled which is great. Subsequently he has seen vascular as well as podiatry. His ABIs are 1.07 on the left and 1.14 on the right he seems to be doing well he does have chronic venous stasis. 11/07/2019 upon evaluation today patient appears to be doing well with regard to his wounds all things considered. I do not see any severe worsening he still has some callus buildup on the right more than the left he notes that he has been probably more active than he should as far as walking is concerned is just very hard to not be active. He knows he needs to be more careful in this regard however. He is willing to give the cast a try at this point although he notes that he is a little nervous about this just with regard to balance although he will be very careful and obviously if he has any trouble he knows to contact the office and let me know. 1/22; patient is in for his obligatory first total contact cast change. Our intake nurse reported a very large amount of drainage which is spelled out over to the surrounding skin. Has bilateral diabetic foot wounds. He has Charcot feet. We have been using silver  alginate on his wounds. 11/14/2019 on evaluation today patient is actually seeming to make good improvement in regard to his bilateral plantar foot wounds. We have been using a cast on the left side and on the right side he has been using dressings he is changing up his own accord. With that being said he tells me that he is also not walking as much just due to how unsteady he feels. He takes it easy when he does have to walk and when he does not have to walk he is resting. This is probably help in his right foot as well has the left foot which is actually measuring better. In fact both are measuring better. Overall I am very pleased with how things seem to be progressing. The patient  does have some odor on the left foot this does have me concerned about the possibility of infection, and actually probably go ahead and put him on antibiotics today as well as utilizing a continuation of the cast on the left foot I think that will be fine we probably just need to bring him in sooner to change this not last a whole week. 1/29; we brought the patient back today for a total contact cast change on the left out of concern for excessive drainage. We are using drawtex over the wound as the primary dressing 11/21/2019 on evaluation today patient appears to be doing well with regard to his left plantar foot. In fact both foot ulcers actually seem to be doing pretty well. Nonetheless he is having a lot of drainage on the left at this time and again we did obtain a wound culture did show positive for Staph aureus that was reviewed by myself today as well. Nonetheless he is on Bactrim which was shown to be sensitive that should be helping in this regard. Fortunately there is no signs of infection systemically at this point. 2/5; back in clinic today for a total contact cast change apparently secondary to very significant drainage. Still using drawtex 11/28/2019 upon evaluation today patient appears to be doing well with  regard to his wounds. The right foot is doing okay as measured about the same in my opinion. The left foot is actually showing signs of significant improvement is measuring smaller there is a lot of hyper granulation likely due to the continued drainage at this point. We did obtain approval for a snap VAC I think that is good to be appropriate for him and will likely help this tremendously underneath the cast. He is definitely in agreement with proceeding with such. 2/12; patient came in today after his snap VAC lost suction. Brought in to see one of our nurses. The dressing was replaced and then we put the cast back on and rehooked up the snap VAC. Apparently his wound looked very good per our intake nurse. 2/15; again we replaced the cast on Friday. By Saturday the snap VAC and light suction. He called this morning he comes in acutely. The wounds look fine however the VAC is not functioning. We replaced the cast using silver alginate as the primary dressing backed with Kerramax. The snap VAC was not replaced 12/05/2019 upon evaluation today patient appears to be doing better in regard to his left plantar foot ulcer. Fortunately there is no signs of active infection at this time. Unfortunately he is continuing to have issues with the right foot he is really not making any progress here things seem to be somewhat stagnant to be honest. The depth has increased but that is due to me having debrided the wound in the past based on what I am seeing. 12/12/2019 on evaluation today patient appears to be doing more poorly in regard to the left lower extremity. He has some erythema spreading up the side of his foot I am concerned about infection again at this point. Unfortunately he has been seeing improvement with a total contact cast but I do not think we should put that on today. On his right plantar foot he continues to have significant drainage this is actually measuring deeper I really do not feel like you  are making any progress whatsoever. I have prescribed Granix for him unfortunately his insurance apparently was going to cost him a $500 co-pay. 12/19/2019 upon evaluation today patient  actually appears to be doing better in regard to both wounds. With that being said he actually did get the reGranix which he had to pay $500 for. With that being said it does look like that he is actually made some improvement based on what I am seeing at this point with the reGranix. Obviously if he is going to continue this we are going to do something about trying to get him some help in covering the cost. 12/26/2019 on evaluation today patient appears to be doing really much better even compared to last week. Overall the wound seems to be much better even compared to last week and last week was better than the week before. Since has been using the reGranix his symptoms have improved significantly. With that being said the issue right now is simply that this is a very expensive medication for him the first dose cost him $500. Upon inspection patient's wound bed actually is however dramatically improved compared to before he started this 2 weeks ago. 01/02/2020 upon evaluation today patient appears to actually be doing well. He still had a little bit of reGranix left that has been using in small amounts he just been applying it every other day instead of every day in order to make it go longer. Overall we are still seeing excellent improvement he is measuring smaller looking better healthier tissue and everything seems to be pointing to this headed in the right direction. Fortunately there is no evidence of infection either which is also excellent news. He does have his MRI coming up within the next week. 01/09/2020 upon evaluation today patient appears to be doing a little worse today compared to previous week's evaluation. He is actually been out of the reGranix at this point. He has been trying to make the stretch out  so he has been changing the dressings on a regular set schedule like he was previous. I think this has made a difference. Fortunately there is no signs of active infection at this time. No fevers, chills, nausea, vomiting, or diarrhea. 01/16/2020 upon evaluation today patient appears to be doing well with regard to his left plantar foot ulcer. The right plantar foot still shows some significant depth at this point. Fortunately there is no signs of active infection at this time. 01/30/2020 upon evaluation today patient appears to be doing about the same in regard to his right plantar foot ulcer there is still some depth here and we had to wait till he actually switched over to his new insurance to get approval for the MRI under his new insurance plan. With that being said he now has switched as of April 1. Fortunately there is no signs of active infection at this time. Overall in regard to his left foot ulcer this seems to be doing much better and I am actually very pleased with how things are going. With that being said it is not quite as much progress as we were seeing with the reGranix but at the same time he has had trouble getting this apparently there is been some hindrance here. I Georgina Peer try to actually send this to [facility] that was recommended by the drug rep to me. 02/13/20 upon evaluation today patient appears to be doing better in regard to his left foot ulcer this is great news. Unfortunately the right foot ulcer is not really significantly better at this time. There is no signs of active infection systemically though he did have his MRI which showed unfortunately he  does have infection noted including an abscess in the foot. There is also marrow changes noted which are consistent with osteomyelitis based on the radiology review and interpretation. Unfortunately considering that the wound is not really making the progress that we will he would like to be seen I think that this is an  indication that he may need some further referral both infectious disease as well as potentially to podiatrist to see if there is anything that can be done to help with the situation that were dealing with here. The left foot again is doing great. Objective Constitutional Obese and well-hydrated in no acute distress. Vitals Time Taken: 1:43 PM, Height: 73 in, Weight: 260 lbs, BMI: 34.3, Temperature: 98.0 F, Pulse: 113 bpm, Respiratory Rate: 18 breaths/min, Blood Pressure: 139/99 mmHg. Respiratory normal breathing without difficulty. Psychiatric this patient is able to make decisions and demonstrates good insight into disease process. Alert and Oriented x 3. pleasant and cooperative. General Notes: Patient's wounds currently on the left foot again is measuring smaller and looking much better and very pleased. Unfortunately on the right foot this is still quite deep and seems to be still giving him some trouble here as far as healing is concerned. He has made a little bit of progress but overall I am still concerned about the fact that there is an obvious abscess on MRI as well as changes in the bone marrow consistent with osteomyelitis with the extensive cellulitis noted as well. This is despite the fact the patient has been on antibiotic therapy multiple times since have been taking care of him. Integumentary (Hair, Skin) Wound #1 status is Open. Original cause of wound was Gradually Appeared. The wound is located on the Left,Plantar Foot. The wound measures 1.1cm length x 1cm width x 0.1cm depth; 0.864cm^2 area and 0.086cm^3 volume. There is Fat Layer (Subcutaneous Tissue) Exposed exposed. There is no tunneling or undermining noted. There is a medium amount of serosanguineous drainage noted. The wound margin is flat and intact. There is large (67-100%) pink granulation within the wound bed. There is a small (1-33%) amount of necrotic tissue within the wound bed including Adherent  Slough. Wound #2 status is Open. Original cause of wound was Gradually Appeared. The wound is located on the Right Metatarsal head second. The wound measures 2.6cm length x 1.7cm width x 1.1cm depth; 3.471cm^2 area and 3.819cm^3 volume. There is Fat Layer (Subcutaneous Tissue) Exposed exposed. There is no tunneling noted, however, there is undermining starting at 4:00 and ending at 10:00 with a maximum distance of 0.7cm. There is a medium amount of serosanguineous drainage noted. The wound margin is well defined and not attached to the wound base. There is large (67-100%) pink granulation within the wound bed. There is a small (1-33%) amount of necrotic tissue within the wound bed including Adherent Slough. Assessment Active Problems ICD-10 Type 2 diabetes mellitus with other skin ulcer Non-pressure chronic ulcer of other part of left foot with fat layer exposed Non-pressure chronic ulcer of other part of right foot with fat layer exposed Essential (primary) hypertension Chronic combined systolic (congestive) and diastolic (congestive) heart failure Venous insufficiency (chronic) (peripheral) Corns and callosities Plan Follow-up Appointments: Return Appointment in 2 weeks. Dressing Change Frequency: Wound #1 Left,Plantar Foot: Change Dressing every other day. Wound #2 Right Metatarsal head second: Change Dressing every other day. Wound Cleansing: Wound #1 Left,Plantar Foot: May shower and wash wound with soap and water. Wound #2 Right Metatarsal head second: May shower and wash wound  with soap and water. Primary Wound Dressing: Wound #1 Left,Plantar Foot: Endoform - moisten with saline Wound #2 Right Metatarsal head second: Endoform - moisten with saline Secondary Dressing: Wound #1 Left,Plantar Foot: Kerlix/Rolled Gauze Dry Gauze ABD pad Wound #2 Right Metatarsal head second: Kerlix/Rolled Gauze Dry Gauze Drawtex - cut to fit inside wound margins to hold endoform in place  (may need 2 layers to fill space) Other: - felt callous pad Edema Control: Patient to wear own compression stockings - daily to both legs Avoid standing for long periods of time Elevate legs to the level of the heart or above for 30 minutes daily and/or when sitting, a frequency of: Off-Loading: Open toe surgical shoe to: - both feet Consults ordered were: Infectious Disease - Osteomyelitis and abscess right foot, non healing wound on plantar foot, Podiatry ([facility]) - Dr. Logan Bores - non healing diabetic foot ulcers, Osteomyelitis and abscess right foot The following medication(s) was prescribed: Augmentin oral 875 mg-125 mg tablet 1 1 tablet oral taken 2 times per day for 14 days starting 02/13/2020 1. My suggestion at this point is can be that we go ahead and make a referral to infectious disease due to the extensive nature of the infection in the left foot as well as the abscess and osteomyelitis noted on MRI. Until the patient can get in with infectious disease I am going to place him on Augmentin for the time being to start something as far as antibiotic therapy is concerned. 2. I am also can make referral to Dr. Gala Lewandowsky who is a local podiatrist to see if there is anything he thinks he could offer an aid to the patient as far as trying to get the wound to heal on the right plantar foot. 3. We will continue the meantime with endoform to both wounds at this point. 4. I am also going to suggest currently that the patient continue to try to offload and keep not walk on the feet any more than he absolutely has to. Obviously the more of this he can do the better. We will see patient back for reevaluation in 2 weeks here in the clinic. If anything worsens or changes patient will contact our office for additional recommendations. Electronic Signature(s) Signed: 02/13/2020 2:59:12 PM By: Lenda Kelp PA-C Previous Signature: 02/13/2020 2:57:13 PM Version By: Lenda Kelp  PA-C Entered By: Lenda Kelp on 02/13/2020 14:59:11 -------------------------------------------------------------------------------- SuperBill Details Patient Name: Date of Service: DANIELA, HERNAN [name] 02/13/2020 Medical Record Number: [id number] Patient Account Number: [id number] Date of Birth/Sex: Treating RN: 27-Dec-1960 (59 y.o. [name] Primary Care Provider: [name] Other Clinician: Referring Provider: Treating Provider/Extender: [name] in Treatment: 15 Diagnosis Coding ICD-10 Codes Code Description E11.622 Type 2 diabetes mellitus with other skin ulcer L97.522 Non-pressure chronic ulcer of other part of left foot with fat layer exposed L97.512 Non-pressure chronic ulcer of other part of right foot with fat layer exposed I10 Essential (primary) hypertension I50.42 Chronic combined systolic (congestive) and diastolic (congestive) heart failure I87.2 Venous insufficiency (chronic) (peripheral) L84 Corns and callosities Facility Procedures CPT4 Code: [id number] Description: 99213 - WOUND CARE VISIT-LEV 3 EST PT Modifier: Quantity: 1 Physician Procedures : CPT4 Code Description Modifier [id number] - WC PHYS LEVEL 4 - EST PT ICD-10 Diagnosis Description E11.622 Type 2 diabetes mellitus with other skin ulcer L97.522 Non-pressure chronic ulcer of other part of left foot with fat layer exposed L97.512  Non-pressure chronic ulcer of  other part of right foot with fat layer exposed I10 Essential (primary) hypertension Quantity: 1 Electronic Signature(s) Signed: 02/13/2020 5:55:37 PM By: Zandra Abts RN, BSN Signed: 02/15/2020 9:02:44 AM By: Lenda Kelp PA-C Previous Signature: 02/13/2020 2:59:24 PM Version By: Lenda Kelp PA-C Entered By: Zandra Abts on 02/13/2020 17:20:17

## 2020-02-14 ENCOUNTER — Telehealth: Payer: Self-pay

## 2020-02-14 NOTE — Telephone Encounter (Signed)
1.Medication Requested:levothyroxine (SYNTHROID) 50 MCG tablet  2. Pharmacy (Name, Street, City):[id number] - [place], [place] - [address]  3. On Med List: Yes   4. Last Visit with PCP:   5. Next visit date with PCP: 5.10.21    Agent: Please be advised that RX refills may take up to 3 business days. We ask that you follow-up with your pharmacy.

## 2020-02-18 NOTE — Progress Notes (Signed)
[name], [name] ([id number]) Visit Report for 02/13/2020 Arrival Information Details Patient Name: Date of Service: [name], [name] 02/13/2020 1:30 PM Medical Record Number: [id number] Patient Account Number: [id number] Date of Birth/Sex: Treating RN: [birth year] (59 y.o. [name] Primary Care [name]: [name] Other Clinician: Referring [name]: Treating [name]/Extender: [name] in Treatment: 15 Visit Information History Since Last Visit Added or deleted any medications: No Patient Arrived: Ambulatory Any new allergies or adverse reactions: No Arrival Time: 13:42 Had a fall or experienced change in No Accompanied By: alone activities of daily living that may affect Transfer Assistance: None risk of falls: Patient Identification Verified: Yes Signs or symptoms of abuse/neglect since last visito No Secondary Verification Process Completed: Yes Hospitalized since last visit: No Patient Requires Transmission-Based Precautions: No Implantable device outside of the clinic excluding No Patient Has Alerts: Yes cellular tissue based products placed in the center Patient Alerts: L ABI 1.07 (04/2019) since last visit: R ABI 1.14 (04/2019) Has Dressing in Place as Prescribed: Yes Pain Present Now: No Electronic Signature(s) Signed: 02/13/2020 5:55:37 PM By: [name] Hurst RN, BSN Entered By: [name] on 02/13/2020 13:43:06 -------------------------------------------------------------------------------- Clinic Level of Care Assessment Details Patient Name: Date of Service: [name], [name] IG 02/13/2020 1:30 PM Medical Record Number: [id number] Patient Account Number: [id number] Date of Birth/Sex: Treating RN: 1961-08-15 (59 y.o. [name] Primary Care [name]: [name] Other Clinician: Referring [name]: Treating [name]/Extender: [name] in Treatment: 15 Clinic Level of Care Assessment Items TOOL 4  Quantity Score X- 1 0 Use when only an EandM is performed on FOLLOW-UP visit ASSESSMENTS - Nursing Assessment / Reassessment X- 1 10 Reassessment of Co-morbidities (includes updates in patient status) X- 1 5 Reassessment of Adherence to Treatment Plan ASSESSMENTS - Wound and Skin A ssessment / Reassessment []  - 0 Simple Wound Assessment / Reassessment - one wound X- 2 5 Complex Wound Assessment / Reassessment - multiple wounds []  - 0 Dermatologic / Skin Assessment (not related to wound area) ASSESSMENTS - Focused Assessment []  - 0 Circumferential Edema Measurements - multi extremities []  - 0 Nutritional Assessment / Counseling / Intervention X- 1 5 Lower Extremity Assessment (monofilament, tuning fork, pulses) []  - 0 Peripheral Arterial Disease Assessment (using hand held doppler) ASSESSMENTS - Ostomy and/or Continence Assessment and Care []  - 0 Incontinence Assessment and Management []  - 0 Ostomy Care Assessment and Management (repouching, etc.) PROCESS - Coordination of Care X - Simple Patient / Family Education for ongoing care 1 15 []  - 0 Complex (extensive) Patient / Family Education for ongoing care X- 1 10 Staff obtains Programmer, systems, Records, T Results / Process Orders est []  - 0 Staff telephones HHA, Nursing Homes / Clarify orders / etc []  - 0 Routine Transfer to another Facility (non-emergent condition) []  - 0 Routine Hospital Admission (non-emergent condition) []  - 0 New Admissions / Biomedical engineer / Ordering NPWT Apligraf, etc. , []  - 0 Emergency Hospital Admission (emergent condition) X- 1 10 Simple Discharge Coordination []  - 0 Complex (extensive) Discharge Coordination PROCESS - Special Needs []  - 0 Pediatric / Minor Patient Management []  - 0 Isolation Patient Management []  - 0 Hearing / Language / Visual special needs []  - 0 Assessment of Community assistance (transportation, D/C planning, etc.) []  - 0 Additional assistance / Altered  mentation []  - 0 Support Surface(s) Assessment (bed, cushion, seat, etc.) INTERVENTIONS - Wound Cleansing / Measurement []  - 0 Simple Wound Cleansing - one wound X- 2  5 Complex Wound Cleansing - multiple wounds X- 1 5 Wound Imaging (photographs - any number of wounds) []  - 0 Wound Tracing (instead of photographs) []  - 0 Simple Wound Measurement - one wound X- 2 5 Complex Wound Measurement - multiple wounds INTERVENTIONS - Wound Dressings []  - 0 Small Wound Dressing one or multiple wounds X- 1 15 Medium Wound Dressing one or multiple wounds []  - 0 Large Wound Dressing one or multiple wounds X- 1 5 Application of Medications - topical []  - 0 Application of Medications - injection INTERVENTIONS - Miscellaneous []  - 0 External ear exam []  - 0 Specimen Collection (cultures, biopsies, blood, body fluids, etc.) []  - 0 Specimen(s) / Culture(s) sent or taken to Lab for analysis []  - 0 Patient Transfer (multiple staff / Civil Service fast streamer / Similar devices) []  - 0 Simple Staple / Suture removal (25 or less) []  - 0 Complex Staple / Suture removal (26 or more) []  - 0 Hypo / Hyperglycemic Management (close monitor of Blood Glucose) []  - 0 Ankle / Brachial Index (ABI) - do not check if billed separately X- 1 5 Vital Signs Has the patient been seen at the hospital within the last three years: Yes Total Score: 115 Level Of Care: New/Established - Level 3 Electronic Signature(s) Signed: 02/13/2020 5:55:37 PM By: [name] Hurst RN, BSN Entered By: [name] on 02/13/2020 17:20:04 -------------------------------------------------------------------------------- Encounter Discharge Information Details Patient Name: Date of Service: [name] IG 02/13/2020 1:30 PM Medical Record Number: [id number] Patient Account Number: [id number] Date of Birth/Sex: Treating RN: 29-Mar-1961 (58 y.o. [name] Primary Care [name]: [name] Other Clinician: Referring [name]: Treating  [name]/Extender: [name] Weeks in Treatment: 15 Encounter Discharge Information Items Discharge Condition: Stable Ambulatory Status: Ambulatory Discharge Destination: Home Transportation: Private Auto Accompanied By: self Schedule Follow-up Appointment: Yes Clinical Summary of Care: Patient Declined Electronic Signature(s) Signed: 02/18/2020 5:02:18 PM By: Carlene Coria RN Entered By: Carlene Coria on 02/13/2020 15:14:41 -------------------------------------------------------------------------------- Lower Extremity Assessment Details Patient Name: Date of Service: [name], [name] 02/13/2020 1:30 PM Medical Record Number: [id number] Patient Account Number: [id number] Date of Birth/Sex: Treating RN: January 15, 1961 (59 y.o. [name] Primary Care Neila Teem: [name] Other Clinician: Referring Giovanni Bath: Treating Trey Gulbranson/Extender: [name] Weeks in Treatment: 15 Edema Assessment Assessed: [Left: No] [Right: No] Edema: [Left: Yes] [Right: Yes] Calf Left: Right: Point of Measurement: 30 cm From Medial Instep 42.5 cm 44 cm Ankle Left: Right: Point of Measurement: 11 cm From Medial Instep 29.5 cm 32 cm Vascular Assessment Pulses: Dorsalis Pedis Palpable: [Left:Yes] [Right:Yes] Electronic Signature(s) Signed: 02/13/2020 5:55:37 PM By: [name] Hurst RN, BSN Entered By: [name] on 02/13/2020 13:44:00 -------------------------------------------------------------------------------- [place] Details Patient Name: Date of Service: [name] IG 02/13/2020 1:30 PM Medical Record Number: [id number] Patient Account Number: [id number] Date of Birth/Sex: Treating RN: [birth year] (59 y.o. [name] Primary Care Reka Wist: [name] Other Clinician: Referring Hesper Venturella: Treating Anayia Eugene/Extender: [name] in Treatment: 15 Active Inactive Wound/Skin Impairment Nursing  Diagnoses: Impaired tissue integrity Knowledge deficit related to ulceration/compromised skin integrity Goals: Patient/caregiver will verbalize understanding of skin care regimen Date Initiated: 10/31/2019 Target Resolution Date: 03/14/2020 Goal Status: Active Ulcer/skin breakdown will have a volume reduction of 30% by week 4 Date Initiated: 10/31/2019 Date Inactivated: 11/28/2019 Target Resolution Date: 11/28/2019 Unmet Reason: left improving, right Goal Status: Unmet unable to offload Ulcer/skin breakdown will have a volume reduction of 50% by week 8 Date Initiated: 11/28/2019  Date Inactivated: 12/26/2019 Target Resolution Date: 12/26/2019 Goal Status: Met Ulcer/skin breakdown will have a volume reduction of 80% by week 12 Date Initiated: 12/26/2019 Date Inactivated: 02/13/2020 Target Resolution Date: 01/23/2020 Goal Status: Unmet Unmet Reason: Osteomyelitis Interventions: Assess patient/caregiver ability to obtain necessary supplies Assess patient/caregiver ability to perform ulcer/skin care regimen upon admission and as needed Assess ulceration(s) every visit Provide education on ulcer and skin care Treatment Activities: Skin care regimen initiated : 10/31/2019 Topical wound management initiated : 10/31/2019 Notes: Electronic Signature(s) Signed: 02/13/2020 5:55:37 PM By: [name] Hurst RN, BSN Entered By: [name] on 02/13/2020 14:50:34 -------------------------------------------------------------------------------- Pain Assessment Details Patient Name: Date of Service: [name] IG 02/13/2020 1:30 PM Medical Record Number: [id number] Patient Account Number: [id number] Date of Birth/Sex: Treating RN: 1960-11-03 (59 y.o. [name] Primary Care Arnice Vanepps: [name] Other Clinician: Referring Ophia Shamoon: Treating Zethan Alfieri/Extender: [name] in Treatment: 15 Active Problems Location of Pain Severity and Description of Pain Patient Has  Paino No Site Locations Pain Management and Medication Current Pain Management: Electronic Signature(s) Signed: 02/13/2020 5:55:37 PM By: [name] Hurst RN, BSN Entered By: [name] on 02/13/2020 13:43:55 -------------------------------------------------------------------------------- Patient/Caregiver Education Details Patient Name: Date of Service: [name] 4/28/2021andnbsp1:30 PM Medical Record Number: [id number] Patient Account Number: [id number] Date of Birth/Gender: Treating RN: 08-Jun-1961 (59 y.o. [name] Primary Care Physician: [name] Other Clinician: Referring Physician: Treating Physician/Extender: [name] in Treatment: 15 Education Assessment Education Provided To: Patient Education Topics Provided Wound/Skin Impairment: Methods: Explain/Verbal Responses: State content correctly [employer]) Signed: 02/13/2020 5:55:37 PM By: [name] Hurst RN, BSN Entered By: [name] on 02/13/2020 14:50:45 -------------------------------------------------------------------------------- Wound Assessment Details Patient Name: Date of Service: [name] IG 02/13/2020 1:30 PM Medical Record Number: [id number] Patient Account Number: [id number] Date of Birth/Sex: Treating RN: [birth year] (59 y.o. [name] Primary Care Lesha Jager: [name] Other Clinician: Referring Amisha Pospisil: Treating Leanard Dimaio/Extender: [name] Weeks in Treatment: 15 Wound Status Wound Number: 1 Primary Diabetic Wound/Ulcer of the Lower Extremity Etiology: Wound Location: Left, Plantar Foot Wound Open Wounding Event: Gradually Appeared Status: Date Acquired: 02/16/2019 Comorbid Anemia, Sleep Apnea, Congestive Heart Failure, Hypertension, Weeks Of Treatment: 15 History: Peripheral Venous Disease, Cirrhosis , Type II Diabetes, Clustered Wound: No Osteoarthritis, Neuropathy Photos Photo Uploaded By: Mikeal Hawthorne on 02/14/2020 14:29:56 Wound Measurements Length: (cm) 1.1 % Redu Width: (cm) 1 % Redu Depth: (cm) 0.1 Epithe Area: (cm) 0.864 [place] Volume: (cm) 0.086 Under ction in Area: 90.4% ction in Volume: 98.6% lialization: Small (1-33%) ling: No mining: No Wound Description Classification: Grade 2 Foul Wound Margin: Flat and Intact Sloug Exudate Amount: Medium Exudate Type: Serosanguineous Exudate Color: red, brown Odor After Cleansing: No h/Fibrino No Wound Bed Granulation Amount: Large (67-100%) Exposed Structure Granulation Quality: Pink Fascia Exposed: No Necrotic Amount: Small (1-33%) Fat Layer (Subcutaneous Tissue) Exposed: Yes Necrotic Quality: Adherent Slough Tendon Exposed: No Muscle Exposed: No Joint Exposed: No Bone Exposed: No Treatment Notes Wound #1 (Left, Plantar Foot) 1. Cleanse With Wound Cleanser Soap and water 3. Primary Dressing Applied Endoform Hydrogel or K-Y Jelly 4. Secondary Dressing Dry Gauze Roll Gauze 5. Secured With Recruitment consultant) Signed: 02/13/2020 5:55:37 PM By: [name] Hurst RN, BSN Entered By: [name] on 02/13/2020 13:50:47 -------------------------------------------------------------------------------- Wound Assessment Details Patient Name: Date of Service: [name], [name] IG 02/13/2020 1:30 PM Medical Record Number: [id number] Patient Account Number: [id number] Date of Birth/Sex: Treating RN: 1960/12/30 (59 y.o. [name] Primary  Care Ismahan Lippman: [name] Other Clinician: Referring Clyde Upshaw: Treating Francis Yardley/Extender: [name] Weeks in Treatment: 15 Wound Status Wound Number: 2 Primary Diabetic Wound/Ulcer of the Lower Extremity Etiology: Wound Location: Right Metatarsal head second Wound Open Wounding Event: Gradually Appeared Status: Date Acquired: 02/16/2019 Comorbid Anemia, Sleep Apnea, Congestive Heart Failure, Hypertension, Weeks Of Treatment: 15 History:  Peripheral Venous Disease, Cirrhosis , Type II Diabetes, Clustered Wound: No Osteoarthritis, Neuropathy Photos Photo Uploaded By: Mikeal Hawthorne on 02/14/2020 14:28:31 Wound Measurements Length: (cm) 2.6 Width: (cm) 1.7 Depth: (cm) 1.1 Area: (cm) 3.471 Volume: (cm) 3.819 % Reduction in Area: -57.8% % Reduction in Volume: -44.7% Epithelialization: Small (1-33%) Tunneling: No Undermining: Yes Starting Position (o'clock): 4 Ending Position (o'clock): 10 Maximum Distance: (cm) 0.7 Wound Description Classification: Grade 2 Wound Margin: Well defined, not attached Exudate Amount: Medium Exudate Type: Serosanguineous Exudate Color: red, brown Foul Odor After Cleansing: No Slough/Fibrino No Wound Bed Granulation Amount: Large (67-100%) Exposed Structure Granulation Quality: Pink Fascia Exposed: No Necrotic Amount: Small (1-33%) Fat Layer (Subcutaneous Tissue) Exposed: Yes Necrotic Quality: Adherent Slough Tendon Exposed: No Muscle Exposed: No Joint Exposed: No Bone Exposed: No Treatment Notes Wound #2 (Right Metatarsal head second) 1. Cleanse With Wound Cleanser Soap and water 3. Primary Dressing Applied Endoform Hydrogel or K-Y Jelly 4. Secondary Dressing Dry Gauze Roll Gauze 5. Secured With Recruitment consultant) Signed: 02/13/2020 5:55:37 PM By: [name] Hurst RN, BSN Entered By: [name] on 02/13/2020 13:51:19 -------------------------------------------------------------------------------- [place] Details Patient Name: Date of Service: [name] IG 02/13/2020 1:30 PM Medical Record Number: [id number] Patient Account Number: [id number] Date of Birth/Sex: Treating RN: 1961-08-26 (59 y.o. [name] Primary Care Leida Luton: [name] Other Clinician: Referring Akiera Allbaugh: Treating Indea Dearman/Extender: [name] Weeks in Treatment: 15 Vital Signs Time Taken: 13:43 Temperature (F): 98.0 Height (in): 73 Pulse (bpm):  113 Weight (lbs): 260 Respiratory Rate (breaths/min): 18 Body Mass Index (BMI): 34.3 Blood Pressure (mmHg): 139/99 Reference Range: 80 - 120 mg / dl Electronic Signature(s) Signed: 02/13/2020 5:55:37 PM By: [name] Hurst RN, BSN Entered By: [name] on 02/13/2020 13:43:24

## 2020-02-19 ENCOUNTER — Other Ambulatory Visit: Payer: Self-pay

## 2020-02-19 ENCOUNTER — Encounter: Payer: Self-pay | Admitting: Sports Medicine

## 2020-02-19 ENCOUNTER — Ambulatory Visit (INDEPENDENT_AMBULATORY_CARE_PROVIDER_SITE_OTHER): Payer: Medicare Other | Admitting: Sports Medicine

## 2020-02-19 VITALS — Temp 97.0°F

## 2020-02-19 DIAGNOSIS — L97512 Non-pressure chronic ulcer of other part of right foot with fat layer exposed: Secondary | ICD-10-CM

## 2020-02-19 DIAGNOSIS — M14679 Charcot's joint, unspecified ankle and foot: Secondary | ICD-10-CM

## 2020-02-19 DIAGNOSIS — L02611 Cutaneous abscess of right foot: Secondary | ICD-10-CM | POA: Diagnosis not present

## 2020-02-19 DIAGNOSIS — M86171 Other acute osteomyelitis, right ankle and foot: Secondary | ICD-10-CM

## 2020-02-19 DIAGNOSIS — L97529 Non-pressure chronic ulcer of other part of left foot with unspecified severity: Secondary | ICD-10-CM

## 2020-02-19 DIAGNOSIS — G629 Polyneuropathy, unspecified: Secondary | ICD-10-CM

## 2020-02-19 DIAGNOSIS — L03031 Cellulitis of right toe: Secondary | ICD-10-CM | POA: Diagnosis not present

## 2020-02-19 NOTE — Progress Notes (Signed)
Subjective: Tyronne Blann is a 59 y.o. male patient seen in office for evaluation of ulceration of the left and right foot. Patient denies a history of diabetes but does report that he has neuropathy.   Patient reports that the wound care center referred him for + changes on MRI that may require surgery. Patient is changing the dressing using endoform and duoderm at home and also going to wound care center weekly. Admits to chronic history of ulcers. Denies nausea/fever/vomiting/chills/night sweats/shortness of breath/pain. Patient has no other pedal complaints at this time.  Review of Systems  All other systems reviewed and are negative.   Meds in chart  Allergies: Loratidine   Objective: There were no vitals filed for this visit.  General: Patient is awake, alert, oriented x 3 and in no acute distress.  Dermatology: Skin is warm and dry bilateral with a full thickness ulceration present sub met 2 on right. Ulceration measures 3 cm x 3cm x 0.4cm. There is a keratotic border with a granular base. The ulceration does not  probe to bone. There is no malodor, no active drainage, + erythema, + edema. Ulceration plantar left midfoot measures less than 1cm with no acute signs of infection.    Vascular: Dorsalis Pedis pulse = 1/4 Bilateral,  Posterior Tibial pulse = 0/4 Bilateral difficult to palpate due to edema.  Capillary Fill Time < 5 seconds  Neurologic: Protective sensation absent bilateral.  Musculosketal: There is no pain with palpation to ulcerated areas. No pain with compression to calves bilateral. +Pes planus/charcot deformity, S/p 5 toe amputations 2016.  MRI in chart   Recent Labs    10/15/19 1610 11/15/19 1700 12/12/19 1635  GRAMSTAIN NO WBC SEEN NO ORGANISMS SEEN Performed at [facility] [address], [place], [place] [address]  NO WBC SEEN NO ORGANISMS SEEN Performed at [facility], [place] [address], [place], [place] [address]  NO WBC SEEN NO ORGANISMS  SEEN   LABORGA PROTEUS MIRABILIS  STAPHYLOCOCCUS AUREUS STAPHYLOCOCCUS AUREUS  --     Assessment and Plan:  Problem List Items Addressed This Visit      Other   Cellulitis    Other Visit Diagnoses    Ulcer of right foot with fat layer exposed ([place])    -  Primary   Ulcer of left foot, unspecified ulcer stage ([place])       Abscess of second toe of right foot       Charcot's joint of foot, unspecified laterality       Neuropathy       Osteomyelitis of foot, right, acute ([place])         -Examined patient and discussed the progression of the wound and treatment alternatives. -MRI reviewed from 1 week ago - Excisionally dedbrided ulceration at right to healthy bleeding borders removing nonviable tissue using a sterile chisel blade. Wound measures post debridement as above. Wound was debrided to the level of the dermis with viable wound base exposed to promote healing. Hemostasis was achieved with manuel pressure. Patient tolerated procedure well without any discomfort or anesthesia necessary for this wound debridement.  -Applied  dry sterile dressing and instructed patient to continue with daily dressings at home consisting of endoform as given by wound center bilateral -Continue with Augmentin -Continue with wound care follow up until time for surgery - Advised patient to go to the ER or return to office if the wound worsens or if constitutional symptoms are present. -Patient opt for surgical  management. Consent obtained for incision and drainage and removal of 2nd metatarsal head on right foot and possible application of graft (osiris) on right. Pre and Post op course explained. Risks, benefits, alternatives explained. No guarantees given or implied. Surgical booking slip submitted and provided patient with Surgical packet and info for [facility] day -H&P form given; patient will see PCP 5/11 -Patient to bring CAM boot/walking boot that he has  -Patient to return to office after surgery for follow  up care and evaluation or sooner if problems arise.  Landis Martins, DPM

## 2020-02-21 ENCOUNTER — Other Ambulatory Visit: Payer: Self-pay | Admitting: Internal Medicine

## 2020-02-24 NOTE — Patient Instructions (Addendum)
  Blood work was ordered.     Medications reviewed and updated.  Changes include :   none  Your prescription(s) have been submitted to your pharmacy. Please take as directed and contact our office if you believe you are having problem(s) with the medication(s).  A referral was ordered for  GI.      Someone will call you to schedule this.    Please followup in 6 months

## 2020-02-24 NOTE — Progress Notes (Signed)
Subjective:    Patient ID: [name], male    DOB: May 19, 1961, 59 y.o.   MRN: [id number]  HPI The patient is here for follow up of their chronic medical problems, including hypertension, HFpEF, alcoholic peripheral neuropathy, cirrhosis, hypothyroidism, insomnia, prediabetes, depression, lymphedema, morbid obesity.  Right foot ulcer:  He follows with podiatry and the wound center.  On 5/17 he will have metatarsal excision second toe on the right. He will also see ID.      He has had a few slip ups with alcohol, but he is doing better than he was.      Medications and allergies reviewed with patient and updated if appropriate.  Patient Active Problem List   Diagnosis Date Noted  . Clogged ear, left 01/22/2020  . Skin abnormality 01/22/2020  . Alcoholic peripheral neuropathy ([place]) 10/28/2019  . Chronic diastolic CHF (congestive heart failure) ([place]) 10/15/2019  . Chronic foot ulcer ([place]) 10/15/2019  . SIRS (systemic inflammatory response syndrome) ([place]) 10/15/2019  . Lymphedema 05/04/2019  . Alcoholic cirrhosis of liver without ascites ([place]) 04/27/2019  . Obesity hypoventilation syndrome ([place]) 04/27/2019  . Depression 04/25/2019  . Severe alcohol use disorder ([place])   . DOE (dyspnea on exertion) 04/22/2019  . Hypothyroidism 04/22/2019  . Anemia 02/06/2019  . Avascular necrosis of hip, left ([place]) 11/02/2018  . Decreased hearing of both ears 10/30/2018  . Avascular necrosis of hip, right ([place]) 08/16/2018  . Bilateral leg edema 02/27/2018  . Marijuana abuse 02/16/2018  . Ulcer of left heel ([place]) 12/21/2016  . Bilateral carpal tunnel syndrome 12/15/2016  . Fatty liver 12/14/2016  . Hyperlipidemia 11/25/2016  . Elevated liver enzymes 11/25/2016  . Prediabetes 11/25/2016  . Hypertension 11/23/2016  . Toe amputation status, right 11/23/2016  . Morbid obesity ([place]) 11/23/2016  . OSA (obstructive sleep apnea) 11/23/2016  . Other hammer toe (acquired) 11/11/2013  . Exstrophy  of bladder 11/11/2013      Past Surgical History:  Procedure Laterality Date  . AMPUTATION TOE Left 05/25/2018   Procedure: AMPUTATION TOE left 3rd;  Surgeon: Wylene Simmer, MD;  Location: [facility];  Service: Orthopedics;  Laterality: Left;  29min, to follow  . AMPUTATION TOE Left 06/28/2018   Procedure: Left foot revision 3rd toe amputation including 3rd metatarsal;  Surgeon: Wylene Simmer, MD;  Location: [facility];  Service: Orthopedics;  Laterality: Left;  2min  . BICEPS TENDON REPAIR Left 2014   Partial  . COLONOSCOPY    . HERNIA REPAIR  2016   ventral  . HIP CLOSED REDUCTION Right 09/01/2018   Procedure: CLOSED REDUCTION HIP;  Surgeon: Nicholes Stairs, MD;  Location[facility];  Service: Orthopedics;  Laterality: Right;  . JOINT REPLACEMENT     b/l knees   . TOE AMPUTATION Right 09/2013  . TOTAL HIP ARTHROPLASTY Right 08/16/2018   Procedure: TOTAL HIP ARTHROPLASTY ANTERIOR APPROACH;  Surgeon: Rod Can, MD;  Location[facility];  Service: Orthopedics;  Laterality: Right;  . TOTAL HIP ARTHROPLASTY Left 11/02/2018   Procedure: TOTAL HIP ARTHROPLASTY ANTERIOR APPROACH;  Surgeon: Rod Can, MD;  Location[facility];  Service: Orthopedics;  Laterality: Left;  . TOTAL KNEE ARTHROPLASTY     bilat    Social History   Socioeconomic History  . Marital status: Divorced    Spouse name: Not on file  . Number of children: Not on file  . Years of education: Not on file  . Highest education level: Not on file  Occupational  History  . Occupation: unemployed, Press photographer for disability  Tobacco Use  . Smoking status: Former Smoker    Packs/day: 0.25    Years: 10.00    Pack years: 2.50    Types: Cigarettes  . Smokeless tobacco: Never Used  . Tobacco comment: quit 2018  Substance and Sexual Activity  . Alcohol use: Yes    Comment: 2-3 drinks 4-5 nights a week, liquor  . Drug use: Yes    Frequency: 1.0 times per week    Types: Marijuana    Comment:  "4 times a month, maybe"  . Sexual activity: Not on file  Other Topics Concern  . Not on file  Social History Narrative  . Not on file   Social Determinants of Health   Financial Resource Strain:   . Difficulty of Paying Living Expenses:   Food Insecurity:   . Worried About Programme researcher, broadcasting/film/video in the Last Year:   . Barista in the Last Year:   Transportation Needs:   . Freight forwarder (Medical):   Marland Kitchen Lack of Transportation (Non-Medical):   Physical Activity:   . Days of Exercise per Week:   . Minutes of Exercise per Session:   Stress:   . Feeling of Stress :   Social Connections:   . Frequency of Communication with Friends and Family:   . Frequency of Social Gatherings with Friends and Family:   . Attends Religious Services:   . Active Member of Clubs or Organizations:   . Attends Banker Meetings:   Marland Kitchen Marital Status:     Family History  Adopted: Yes  Family history unknown: Yes    Review of Systems  Constitutional: Negative for chills and fever.  Respiratory: Positive for shortness of breath (first thing in the morning). Negative for cough and wheezing.   Cardiovascular: Positive for leg swelling. Negative for chest pain and palpitations.  Gastrointestinal: Positive for diarrhea (occ). Negative for abdominal pain, blood in stool, constipation and nausea.  Neurological: Negative for light-headedness and headaches.       Objective:   Vitals:   02/25/20 0959  BP: 130/80  Pulse: (!) 104  Resp: 18  Temp: 98.4 F (36.9 C)  SpO2: 96%   BP Readings from Last 3 Encounters:  02/25/20 130/80  02/25/20 130/80  01/22/20 (!) 158/92   Wt Readings from Last 3 Encounters:  02/25/20 291 lb (132 kg)  02/25/20 291 lb (132 kg)  01/22/20 283 lb (128.4 kg)   Body mass index is 38.39 kg/m.   Physical Exam    Constitutional: Appears well-developed and well-nourished. No distress.  HENT:  Head: Normocephalic and atraumatic.  Neck: Neck  supple. No tracheal deviation present. No thyromegaly present.  No cervical lymphadenopathy Cardiovascular: Normal rate, regular rhythm and normal heart sounds.   No murmur heard. No carotid bruit .  Mild b/l LE edema Pulmonary/Chest: Effort normal and breath sounds normal. No respiratory distress. No has no wheezes. No rales.  Skin: Skin is warm and dry. Not diaphoretic.  Psychiatric: Normal mood and affect. Behavior is normal.      Assessment & Plan:    See Problem List for Assessment and Plan of chronic medical problems.    This visit occurred during the SARS-CoV-2 public health emergency.  Safety protocols were in place, including screening questions prior to the visit, additional usage of staff PPE, and extensive cleaning of exam room while observing appropriate contact time as indicated for disinfecting solutions.

## 2020-02-25 ENCOUNTER — Other Ambulatory Visit: Payer: Self-pay

## 2020-02-25 ENCOUNTER — Ambulatory Visit (INDEPENDENT_AMBULATORY_CARE_PROVIDER_SITE_OTHER): Payer: Medicare Other | Admitting: Internal Medicine

## 2020-02-25 ENCOUNTER — Encounter: Payer: Self-pay | Admitting: Internal Medicine

## 2020-02-25 ENCOUNTER — Ambulatory Visit (INDEPENDENT_AMBULATORY_CARE_PROVIDER_SITE_OTHER): Payer: Medicare Other

## 2020-02-25 VITALS — BP 130/80 | HR 104 | Temp 98.4°F | Resp 18 | Ht 73.0 in | Wt 291.0 lb

## 2020-02-25 VITALS — BP 130/80 | HR 104 | Temp 98.4°F | Ht 73.0 in | Wt 291.0 lb

## 2020-02-25 DIAGNOSIS — E039 Hypothyroidism, unspecified: Secondary | ICD-10-CM | POA: Diagnosis not present

## 2020-02-25 DIAGNOSIS — K7030 Alcoholic cirrhosis of liver without ascites: Secondary | ICD-10-CM | POA: Diagnosis not present

## 2020-02-25 DIAGNOSIS — G621 Alcoholic polyneuropathy: Secondary | ICD-10-CM

## 2020-02-25 DIAGNOSIS — Z0000 Encounter for general adult medical examination without abnormal findings: Secondary | ICD-10-CM | POA: Diagnosis not present

## 2020-02-25 DIAGNOSIS — R7303 Prediabetes: Secondary | ICD-10-CM

## 2020-02-25 DIAGNOSIS — I890 Lymphedema, not elsewhere classified: Secondary | ICD-10-CM

## 2020-02-25 DIAGNOSIS — I5032 Chronic diastolic (congestive) heart failure: Secondary | ICD-10-CM | POA: Diagnosis not present

## 2020-02-25 DIAGNOSIS — I10 Essential (primary) hypertension: Secondary | ICD-10-CM | POA: Diagnosis not present

## 2020-02-25 DIAGNOSIS — F3289 Other specified depressive episodes: Secondary | ICD-10-CM

## 2020-02-25 LAB — CBC WITH DIFFERENTIAL/PLATELET
Basophils Absolute: 0 10*3/uL (ref 0.0–0.1)
Basophils Relative: 0.6 % (ref 0.0–3.0)
Eosinophils Absolute: 0 10*3/uL (ref 0.0–0.7)
Eosinophils Relative: 0.7 % (ref 0.0–5.0)
HCT: 39.7 % (ref 39.0–52.0)
Hemoglobin: 13.7 g/dL (ref 13.0–17.0)
Lymphocytes Relative: 34.2 % (ref 12.0–46.0)
Lymphs Abs: 2 10*3/uL (ref 0.7–4.0)
MCHC: 34.4 g/dL (ref 30.0–36.0)
MCV: 96.3 fl (ref 78.0–100.0)
Monocytes Absolute: 0.5 10*3/uL (ref 0.1–1.0)
Monocytes Relative: 7.9 % (ref 3.0–12.0)
Neutro Abs: 3.2 10*3/uL (ref 1.4–7.7)
Neutrophils Relative %: 56.6 % (ref 43.0–77.0)
Platelets: 217 10*3/uL (ref 150.0–400.0)
RBC: 4.12 Mil/uL — ABNORMAL LOW (ref 4.22–5.81)
RDW: 14.7 % (ref 11.5–15.5)
WBC: 5.7 10*3/uL (ref 4.0–10.5)

## 2020-02-25 LAB — COMPREHENSIVE METABOLIC PANEL
ALT: 32 U/L (ref 0–53)
AST: 42 U/L — ABNORMAL HIGH (ref 0–37)
Albumin: 4 g/dL (ref 3.5–5.2)
Alkaline Phosphatase: 180 U/L — ABNORMAL HIGH (ref 39–117)
BUN: 5 mg/dL — ABNORMAL LOW (ref 6–23)
CO2: 28 mEq/L (ref 19–32)
Calcium: 9.1 mg/dL (ref 8.4–10.5)
Chloride: 101 mEq/L (ref 96–112)
Creatinine, Ser: 0.65 mg/dL (ref 0.40–1.50)
GFR: 125.91 mL/min (ref >60.00)
Glucose, Bld: 139 mg/dL — ABNORMAL HIGH (ref 70–99)
Potassium: 3.5 mEq/L (ref 3.5–5.1)
Sodium: 137 mEq/L (ref 135–145)
Total Bilirubin: 0.8 mg/dL (ref 0.2–1.2)
Total Protein: 8.3 g/dL (ref 6.0–8.3)

## 2020-02-25 LAB — TSH: TSH: 2.26 u[IU]/mL (ref 0.35–4.50)

## 2020-02-25 LAB — HEMOGLOBIN A1C: Hgb A1c MFr Bld: 5.7 % (ref 4.6–6.5)

## 2020-02-25 NOTE — Assessment & Plan Note (Signed)
Chronic BP well controlled Current regimen effective and well tolerated Continue current medications at current doses cmp  

## 2020-02-25 NOTE — Assessment & Plan Note (Signed)
Chronic Somewhat controlled, stable - he feels the pandemic is a big source of his depression Continue current dose of medication

## 2020-02-25 NOTE — Assessment & Plan Note (Signed)
Chronic Controlled On lasix 40 mg BID Continue cmp

## 2020-02-25 NOTE — Assessment & Plan Note (Signed)
Chronic  Clinically euthyroid Check tsh  Titrate med dose if needed  

## 2020-02-25 NOTE — Assessment & Plan Note (Signed)
Chronic Still drinks some alcohol on occasion - stressed he can not drink any alcohol Stressed weight loss Will refer to GI Recommended hep A/B vaccination

## 2020-02-25 NOTE — Assessment & Plan Note (Signed)
Chronic euvolemic on exam Continue current medications

## 2020-02-25 NOTE — Progress Notes (Signed)
Subjective:   [name] is a 59 y.o. male who presents for an Initial Medicare Annual Wellness Visit.  Review of Systems  No ROS. Medicare Wellness Visit. Additional risk factors are reflected in social history. Cardiac Risk Factors include: advanced age (>79men, >31 women);dyslipidemia;male gender;hypertension;obesity (BMI >30kg/m2)  Sleep Patterns: No sleep issues, feels rested on waking and sleeps 8 hours nightly; gets up 1-2 times to void. Home Safety/Smoke Alarms: Feels safe in home; uses home alarm. Smoke alarms in place. Living environment: 2-story home. Lives with significant other, no needs for DME, good support system. Seat Belt Safety/Bike Helmet: Wears seat belt.   Objective:    Today's Vitals   02/25/20 1001 02/25/20 1002  BP: 130/80   Pulse: (!) 104   Temp: 98.4 F (36.9 C)   SpO2: 96%   Weight: 291 lb (132 kg)   Height: 6\' 1"  (1.854 m)   PainSc: 6  6    Body mass index is 38.39 kg/m.  Advanced Directives 02/25/2020 10/15/2019 10/15/2019 04/24/2019 04/05/2019 02/02/2019 01/29/2019  Does Patient Have a Medical Advance Directive? No No No No No No No  Would patient like information on creating a medical advance directive? No - Patient declined No - Patient declined - Yes (ED - Information included in AVS) No - Patient declined No - Patient declined No - Patient declined    Allergies (verified) Claritin [loratadine]   History: Past Medical History:  Diagnosis Date  . Alcohol dependence ([place])   . Arthritis    hips, hands  . Bilateral carpal tunnel syndrome   . Bilateral leg edema    Chronic  . Diabetes mellitus without complication ([place])    type 2  . Diverticulitis    portion of colon removed  . DOE (dyspnea on exertion)    occ  . Elevated liver enzymes   . Fatty liver   . GERD (gastroesophageal reflux disease)    occ  . Hammer toe   . Hip pain   . History of ventral hernia repair 2016   x2  . Hyperlipidemia    pt unsure  . Hypertension   .  Marijuana abuse   . Morbid obesity ([place])   . Neuromuscular disorder ([place])    peripheral neuropathy feet and few fingers  . OSA (obstructive sleep apnea)    has OSA-not used CPAP 2-3 yrs could not tolerate cpap  . PONV (postoperative nausea and vomiting)   . Toe ulcer ([place])    left healed   Past Surgical History:  Procedure Laterality Date  . AMPUTATION TOE Left 05/25/2018   Procedure: AMPUTATION TOE left 3rd;  Surgeon: Wylene Simmer, MD;  Location: [facility];  Service: Orthopedics;  Laterality: Left;  41min, to follow  . AMPUTATION TOE Left 06/28/2018   Procedure: Left foot revision 3rd toe amputation including 3rd metatarsal;  Surgeon: Wylene Simmer, MD;  Location: [facility];  Service: Orthopedics;  Laterality: Left;  27min  . BICEPS TENDON REPAIR Left 2014   Partial  . COLONOSCOPY    . HERNIA REPAIR  2016   ventral  . HIP CLOSED REDUCTION Right 09/01/2018   Procedure: CLOSED REDUCTION HIP;  Surgeon: Nicholes Stairs, MD;  [facility];  Service: Orthopedics;  Laterality: Right;  . JOINT REPLACEMENT     b/l knees   . TOE AMPUTATION Right 09/2013  . TOTAL HIP ARTHROPLASTY Right 08/16/2018   Procedure: TOTAL HIP ARTHROPLASTY ANTERIOR APPROACH;  Surgeon: Rod Can, MD;  Location:  [facility];  Service: Orthopedics;  Laterality: Right;  . TOTAL HIP ARTHROPLASTY Left 11/02/2018   Procedure: TOTAL HIP ARTHROPLASTY ANTERIOR APPROACH;  Surgeon: Samson Frederic, MD;  [facility];  Service: Orthopedics;  Laterality: Left;  . TOTAL KNEE ARTHROPLASTY     bilat   Family History  Adopted: Yes  Family history unknown: Yes   Social History   Socioeconomic History  . Marital status: Divorced    Spouse name: Not on file  . Number of children: Not on file  . Years of education: Not on file  . Highest education level: Not on file  Occupational History  . Occupation: unemployed, Press photographer for disability  Tobacco Use  . Smoking status: Former Smoker     Packs/day: 0.25    Years: 10.00    Pack years: 2.50    Types: Cigarettes  . Smokeless tobacco: Never Used  . Tobacco comment: quit 2018  Substance and Sexual Activity  . Alcohol use: Yes    Comment: 2-3 drinks 4-5 nights a week, liquor  . Drug use: Yes    Frequency: 1.0 times per week    Types: Marijuana    Comment: "4 times a month, maybe"  . Sexual activity: Not on file  Other Topics Concern  . Not on file  Social History Narrative  . Not on file   Social Determinants of Health   Financial Resource Strain:   . Difficulty of Paying Living Expenses:   Food Insecurity:   . Worried About Programme researcher, broadcasting/film/video in the Last Year:   . Barista in the Last Year:   Transportation Needs:   . Freight forwarder (Medical):   Marland Kitchen Lack of Transportation (Non-Medical):   Physical Activity:   . Days of Exercise per Week:   . Minutes of Exercise per Session:   Stress:   . Feeling of Stress :   Social Connections:   . Frequency of Communication with Friends and Family:   . Frequency of Social Gatherings with Friends and Family:   . Attends Religious Services:   . Active Member of Clubs or Organizations:   . Attends Banker Meetings:   Marland Kitchen Marital Status:    Tobacco Counseling Counseling given: No Comment: quit 2018   Clinical Intake:  Pre-visit preparation completed: Yes  Pain : 0-10 Pain Score: 6  Pain Type: Chronic pain Pain Location: Knee Pain Orientation: Right, Left Pain Radiating Towards: bilateral hips, knees and feet pain Pain Descriptors / Indicators: Discomfort Pain Onset: More than a month ago Pain Frequency: Constant     BMI - recorded: 38.4 Nutritional Status: BMI > 30  Obese Nutritional Risks: None Diabetes: No(prediabetes)  How often do you need to have someone help you when you read instructions, pamphlets, or other written materials from your doctor or pharmacy?: 1 - Never What is the last grade level you completed in school?:  Bachelor's Degree, Culinary Degree  Interpreter Needed?: No  Information entered by :: Ger Ringenberg N. Primrose Oler, LPN  Activities of Daily Living In your present state of health, do you have any difficulty performing the following activities: 02/25/2020 10/15/2019  Hearing? N N  Vision? N N  Difficulty concentrating or making decisions? N N  Walking or climbing stairs? Malvin Johns  Comment stays on main level of house secondary to foot ulcer  Dressing or bathing? N N  Doing errands, shopping? N N  Preparing Food and eating ? N -  Using the Toilet? N -  In the past six months, have you accidently leaked urine? N -  Do you have problems with loss of bowel control? N -  Managing your Medications? N -  Managing your Finances? N -  Housekeeping or managing your Housekeeping? N -  Some recent data might be hidden     Immunizations and Health Maintenance Immunization History  Administered Date(s) Administered  . PFIZER SARS-COV-2 Vaccination 01/17/2020, 02/11/2020  . Pneumococcal Polysaccharide-23 05/04/2019  . Tdap 11/23/2016   There are no preventive care reminders to display for this patient.  Patient Care Team: [name], MD as PCP - General (Internal Medicine)  Indicate any recent Medical Services you may have received from other than [facility] providers in the past year (date may be approximate).    Assessment:   This is a routine wellness examination for [place].  Hearing/Vision screen No exam data present  Dietary issues and exercise activities discussed: Current Exercise Habits: The patient does not participate in regular exercise at present, Exercise limited by: orthopedic condition(s);Other - see comments(neuropathy in feet)  Goals    . Client understands the importance of follow-up with providers by attending scheduled visits    . Patient Stated     Would like to lose 40-50 pounds.      Depression Screen PHQ 2/9 Scores 02/25/2020 05/04/2019 02/01/2018  PHQ - 2 Score 0 0 0     Fall Risk Fall Risk  02/25/2020 04/23/2019 02/06/2019  Falls in the past year? 0 1 1  Number falls in past yr: 0 1 1  Injury with Fall? 0 1 -  Risk for fall due to : Orthopedic patient - -  Follow up Falls evaluation completed;Education provided - -    Is the patient's home free of loose throw rugs in walkways, pet beds, electrical cords, etc?   yes      Grab bars in the bathroom? no      Handrails on the stairs?   yes      Adequate lighting?   yes     6CIT Screen 02/25/2020  What Year? 0 points  What month? 0 points  What time? 0 points  Count back from 20 0 points  Months in reverse 0 points  Repeat phrase 0 points  Total Score 0    Screening Tests Health Maintenance  Topic Date Due  . INFLUENZA VACCINE  05/18/2020  . COLONOSCOPY  10/18/2021  . TETANUS/TDAP  11/23/2026  . COVID-19 Vaccine  Completed  . Hepatitis C Screening  Completed  . HIV Screening  Completed    Qualifies for Shingles Vaccine?  declined  Cancer Screenings: Lung: Low Dose CT Chest recommended if Age 21-80 years, 30 pack-year currently smoking OR have quit w/in 15years. Patient does not qualify. Colorectal: declined      Plan:     Reviewed health maintenance screenings with patient today and relevant education, vaccines, and/or referrals were provided.    Continue doing brain stimulating activities (puzzles, reading, adult coloring books, staying active) to keep memory sharp.    Continue to eat heart healthy diet (full of fruits, vegetables, whole grains, lean protein, water--limit salt, fat, and sugar intake) and increase physical activity as tolerated.  I have personally reviewed and noted the following in the patient's chart:   . Medical and social history . Use of alcohol, tobacco or illicit drugs  . Current medications and supplements . Functional ability and status . Nutritional status . Physical activity . Advanced directives . List of  other physicians . Hospitalizations,  surgeries, and ER visits in previous 12 months . Vitals . Screenings to include cognitive, depression, and falls . Referrals and appointments  In addition, I have reviewed and discussed with patient certain preventive protocols, quality metrics, and best practice recommendations. A written personalized care plan for preventive services as well as general preventive health recommendations were provided to patient.    Anila Bojarski N. Reymundo Poll, LPN   1/61/0960 Nurse Health Advisor

## 2020-02-25 NOTE — Progress Notes (Signed)
Subjective:   [name] is a 59 y.o. male who presents for an Initial Medicare Annual Wellness Visit.  Review of Systems  No ROS. Medicare Wellness Visit. Additional risk factors are reflected in social history. Cardiac Risk Factors include: advanced age (>42men, >40 women);dyslipidemia;male gender;hypertension;obesity (BMI >30kg/m2)  Sleep Patterns: No sleep issues, feels rested on waking and sleeps 8 hours nightly; gets up 1-2 times to void. Home Safety/Smoke Alarms: Feels safe in home; uses home alarm. Smoke alarms in place. Living environment: 2-story home. Lives with significant other, no needs for DME, good support system. Seat Belt Safety/Bike Helmet: Wears seat belt.   Objective:    Today's Vitals   02/25/20 1001 02/25/20 1002  BP: 130/80   Pulse: (!) 104   Temp: 98.4 F (36.9 C)   SpO2: 96%   Weight: 291 lb (132 kg)   Height: 6\' 1"  (1.854 m)   PainSc: 6  6    Body mass index is 38.39 kg/m.  Advanced Directives 02/25/2020 10/15/2019 10/15/2019 04/24/2019 04/05/2019 02/02/2019 01/29/2019  Does Patient Have a Medical Advance Directive? No No No No No No No  Would patient like information on creating a medical advance directive? No - Patient declined No - Patient declined - Yes (ED - Information included in AVS) No - Patient declined No - Patient declined No - Patient declined    Current Medications (verified)  Allergies (verified) Claritin [loratadine]   History: Past Medical History:  Diagnosis Date  . Alcohol dependence (HCC)   . Arthritis    hips, hands  . Bilateral carpal tunnel syndrome   . Bilateral leg edema    Chronic  . Diabetes mellitus without complication (HCC)    type 2  . Diverticulitis    portion of colon removed  . DOE (dyspnea on exertion)    occ  . Elevated liver enzymes   . Fatty liver   . GERD (gastroesophageal reflux disease)    occ  . Hammer toe   . Hip pain   . History of ventral hernia repair 2016   x2  . Hyperlipidemia    pt unsure  . Hypertension   . Marijuana abuse   . Morbid obesity (HCC)   . Neuromuscular disorder (HCC)    peripheral neuropathy feet and few fingers  . OSA (obstructive sleep apnea)    has OSA-not used CPAP 2-3 yrs could not tolerate cpap  . PONV (postoperative nausea and vomiting)   . Toe ulcer (HCC)    left healed   Past Surgical History:  Procedure Laterality Date  . AMPUTATION TOE Left 05/25/2018   Procedure: AMPUTATION TOE left 3rd;  Surgeon: 07/25/2018, MD;  Location: [facility];  Service: Orthopedics;  Laterality: Left;  Toni Arthurs, to follow  . AMPUTATION TOE Left 06/28/2018   Procedure: Left foot revision 3rd toe amputation including 3rd metatarsal;  Surgeon: 08/28/2018, MD;  Location: [facility];  Service: Orthopedics;  Laterality: Left;  Toni Arthurs  . BICEPS TENDON REPAIR Left 2014   Partial  . COLONOSCOPY    . HERNIA REPAIR  2016   ventral  . HIP CLOSED REDUCTION Right 09/01/2018   Procedure: CLOSED REDUCTION HIP;  Surgeon: 09/03/2018, MD;  Location: [facility];  Service: Orthopedics;  Laterality: Right;  . JOINT REPLACEMENT     b/l knees   . TOE AMPUTATION Right 09/2013  . TOTAL HIP ARTHROPLASTY Right 08/16/2018   Procedure: TOTAL HIP ARTHROPLASTY ANTERIOR APPROACH;  Surgeon: 08/18/2018,  MD;  Location: [facility];  Service: Orthopedics;  Laterality: Right;  . TOTAL HIP ARTHROPLASTY Left 11/02/2018   Procedure: TOTAL HIP ARTHROPLASTY ANTERIOR APPROACH;  Surgeon: Samson Frederic, MD;  Location: [facility];  Service: Orthopedics;  Laterality: Left;  . TOTAL KNEE ARTHROPLASTY     bilat   Family History  Adopted: Yes  Family history unknown: Yes   Social History   Socioeconomic History  . Marital status: Divorced    Spouse name: Not on file  . Number of children: Not on file  . Years of education: Not on file  . Highest education level: Not on file  Occupational History  . Occupation: unemployed, Press photographer for disability  Tobacco Use  .  Smoking status: Former Smoker    Packs/day: 0.25    Years: 10.00    Pack years: 2.50    Types: Cigarettes  . Smokeless tobacco: Never Used  . Tobacco comment: quit 2018  Substance and Sexual Activity  . Alcohol use: Yes    Comment: 2-3 drinks 4-5 nights a week, liquor  . Drug use: Yes    Frequency: 1.0 times per week    Types: Marijuana    Comment: "4 times a month, maybe"  . Sexual activity: Not on file  Other Topics Concern  . Not on file  Social History Narrative  . Not on file   Social Determinants of Health   Financial Resource Strain:   . Difficulty of Paying Living Expenses:   Food Insecurity:   . Worried About Programme researcher, broadcasting/film/video in the Last Year:   . Barista in the Last Year:   Transportation Needs:   . Freight forwarder (Medical):   Marland Kitchen Lack of Transportation (Non-Medical):   Physical Activity:   . Days of Exercise per Week:   . Minutes of Exercise per Session:   Stress:   . Feeling of Stress :   Social Connections:   . Frequency of Communication with Friends and Family:   . Frequency of Social Gatherings with Friends and Family:   . Attends Religious Services:   . Active Member of Clubs or Organizations:   . Attends Banker Meetings:   Marland Kitchen Marital Status:    Tobacco Counseling Counseling given: No Comment: quit 2018   Clinical Intake:  Pre-visit preparation completed: Yes  Pain : 0-10 Pain Score: 6  Pain Type: Chronic pain Pain Location: Knee Pain Orientation: Right, Left Pain Radiating Towards: bilateral hips, knees and feet pain Pain Descriptors / Indicators: Discomfort Pain Onset: More than a month ago Pain Frequency: Constant     BMI - recorded: 38.4 Nutritional Status: BMI > 30  Obese Nutritional Risks: None Diabetes: No(prediabetes)  How often do you need to have someone help you when you read instructions, pamphlets, or other written materials from your doctor or pharmacy?: 1 - Never What is the last grade  level you completed in school?: Bachelor's Degree, Culinary Degree  Interpreter Needed?: No  Information entered by :: Shenika N. Hatfield, LPN  Activities of Daily Living In your present state of health, do you have any difficulty performing the following activities: 02/25/2020 10/15/2019  Hearing? N N  Vision? N N  Difficulty concentrating or making decisions? N N  Walking or climbing stairs? Malvin Johns  Comment stays on main level of house secondary to foot ulcer  Dressing or bathing? N N  Doing errands, shopping? N N  Preparing Food and eating ? N -  Using  the Toilet? N -  In the past six months, have you accidently leaked urine? N -  Do you have problems with loss of bowel control? N -  Managing your Medications? N -  Managing your Finances? N -  Housekeeping or managing your Housekeeping? N -  Some recent data might be hidden     Immunizations and Health Maintenance Immunization History  Administered Date(s) Administered  . PFIZER SARS-COV-2 Vaccination 01/17/2020, 02/11/2020  . Pneumococcal Polysaccharide-23 05/04/2019  . Tdap 11/23/2016   There are no preventive care reminders to display for this patient.  Patient Care Team: [name], MD as PCP - General (Internal Medicine)  Indicate any recent Medical Services you may have received from other than [facility] providers in the past year (date may be approximate).    Assessment:   This is a routine wellness examination for [place].  Hearing/Vision screen No exam data present  Dietary issues and exercise activities discussed: Current Exercise Habits: The patient does not participate in regular exercise at present, Exercise limited by: orthopedic condition(s);Other - see comments(neuropathy in feet)  Goals    . Client understands the importance of follow-up with providers by attending scheduled visits    . Patient Stated     Would like to lose 40-50 pounds.      Depression Screen PHQ 2/9 Scores 02/25/2020 05/04/2019  02/01/2018  PHQ - 2 Score 0 0 0    Fall Risk Fall Risk  02/25/2020 04/23/2019 02/06/2019  Falls in the past year? 0 1 1  Number falls in past yr: 0 1 1  Injury with Fall? 0 1 -  Risk for fall due to : Orthopedic patient - -  Follow up Falls evaluation completed;Education provided - -    Is the patient's home free of loose throw rugs in walkways, pet beds, electrical cords, etc?   yes      Grab bars in the bathroom? no      Handrails on the stairs?   yes      Adequate lighting?   yes     6CIT Screen 02/25/2020  What Year? 0 points  What month? 0 points  What time? 0 points  Count back from 20 0 points  Months in reverse 0 points  Repeat phrase 0 points  Total Score 0    Screening Tests Health Maintenance  Topic Date Due  . INFLUENZA VACCINE  05/18/2020  . COLONOSCOPY  10/18/2021  . TETANUS/TDAP  11/23/2026  . COVID-19 Vaccine  Completed  . Hepatitis C Screening  Completed  . HIV Screening  Completed    Qualifies for Shingles Vaccine?  declined  Cancer Screenings: Lung: Low Dose CT Chest recommended if Age 32-80 years, 30 pack-year currently smoking OR have quit w/in 15years. Patient does not qualify. Colorectal: declined      Plan:     Reviewed health maintenance screenings with patient today and relevant education, vaccines, and/or referrals were provided.    Continue doing brain stimulating activities (puzzles, reading, adult coloring books, staying active) to keep memory sharp.    Continue to eat heart healthy diet (full of fruits, vegetables, whole grains, lean protein, water--limit salt, fat, and sugar intake) and increase physical activity as tolerated.  I have personally reviewed and noted the following in the patient's chart:   . Medical and social history . Use of alcohol, tobacco or illicit drugs  . Current medications and supplements . Functional ability and status . Nutritional status . Physical activity .  Advanced directives . List of other  physicians . Hospitalizations, surgeries, and ER visits in previous 12 months . Vitals . Screenings to include cognitive, depression, and falls . Referrals and appointments  In addition, I have reviewed and discussed with patient certain preventive protocols, quality metrics, and best practice recommendations. A written personalized care plan for preventive services as well as general preventive health recommendations were provided to patient.    Shenika N. Lowell Guitar, [place]   [id number] Nurse Health Advisor

## 2020-02-25 NOTE — Assessment & Plan Note (Signed)
Chronic With foot ulcer on right Stressed avoiding all alcohol Following with podiatry, wound center

## 2020-02-25 NOTE — Assessment & Plan Note (Signed)
Chronic Check a1c Low sugar / carb diet Stressed regular exercise  

## 2020-02-26 ENCOUNTER — Other Ambulatory Visit: Payer: Self-pay

## 2020-02-26 ENCOUNTER — Encounter: Payer: Self-pay | Admitting: Internal Medicine

## 2020-02-26 ENCOUNTER — Encounter (HOSPITAL_BASED_OUTPATIENT_CLINIC_OR_DEPARTMENT_OTHER): Payer: Self-pay | Admitting: Sports Medicine

## 2020-02-26 NOTE — Telephone Encounter (Signed)
Patient is calling to see if you received the surgical clearance form.  I do not have this form, Do you? Requesting a call back to inform if we do have it.

## 2020-02-26 NOTE — Telephone Encounter (Signed)
   Patient requesting surgical clearance form be faxed back to [facility]/ [place] at [phone]

## 2020-02-26 NOTE — Progress Notes (Signed)
Chart reviewed by Dr. Clemens Catholic, ok to proceed as planned with surgery at [facility].

## 2020-02-27 ENCOUNTER — Encounter: Payer: Self-pay | Admitting: Internal Medicine

## 2020-02-27 ENCOUNTER — Encounter (HOSPITAL_BASED_OUTPATIENT_CLINIC_OR_DEPARTMENT_OTHER): Payer: Medicare Other | Admitting: Physician Assistant

## 2020-02-27 ENCOUNTER — Ambulatory Visit (INDEPENDENT_AMBULATORY_CARE_PROVIDER_SITE_OTHER): Payer: Medicare Other | Admitting: Internal Medicine

## 2020-02-27 DIAGNOSIS — M86171 Other acute osteomyelitis, right ankle and foot: Secondary | ICD-10-CM | POA: Insufficient documentation

## 2020-02-27 NOTE — Progress Notes (Signed)
[facility] for Infectious Disease      Reason for Consult: osteomylelitis and abscess right foot    Referring Physician: Jeri Cos PA    Patient ID: [name], male    DOB: 11/12/1960, 59 y.o.   MRN: [id number]  HPI:   Here for evaluation of right foot osteo on MRI with abscess.  Going for definitive treatment with Dr. Cannon Kettle next week with amputation and debridement.  On Augmentin.  No concerns.  Has had amputations before.  Has had osteomyelitis and prolonged IV therapy.     Past Medical History:  Diagnosis Date  . Alcohol dependence ([place])   . Arthritis    hips, hands  . Bilateral carpal tunnel syndrome   . Bilateral leg edema    Chronic  . Diabetes mellitus without complication ([place])    type 2  . Diverticulitis    portion of colon removed  . DOE (dyspnea on exertion)    occ  . Elevated liver enzymes   . Fatty liver   . GERD (gastroesophageal reflux disease)    occ  . Hammer toe   . Hip pain   . History of ventral hernia repair 2016   x2  . Hyperlipidemia    pt unsure  . Hypertension   . Marijuana abuse   . Morbid obesity ([place])   . Neuromuscular disorder ([place])    peripheral neuropathy feet and few fingers  . OSA (obstructive sleep apnea)    has OSA-not used CPAP 2-3 yrs could not tolerate cpap  . PONV (postoperative nausea and vomiting)   . Toe ulcer ([place])    left healed    Prior to Admission medications   Medication Sig Start Date End Date Taking? Authorizing Provider  amLODipine (NORVASC) 5 MG tablet amlodipine 5 mg tablet  TAKE 1 TABLET BY MOUTH EVERY DAY    [provider]  amoxicillin-clavulanate (AUGMENTIN) 875-125 MG tablet Take 1 tablet by mouth 2 (two) times daily. 02/13/20   [provider]  calcium carbonate (OS-CAL) 1250 (500 Ca) MG chewable tablet calcium carbonate 200 mg calcium (500 mg) chewable tablet   2 s by oral route.    [provider]  diphenhydrAMINE (BENADRYL ALLERGY) 25 MG tablet Take 25 mg by  mouth at bedtime as needed for allergies.     [provider]  DULoxetine (CYMBALTA) 20 MG capsule Take 1 capsule (20 mg total) by mouth daily. 10/29/19   Binnie Rail, MD  furosemide (LASIX) 40 MG tablet TAKE 1 TABLET (40 MG TOTAL) BY MOUTH 2 (TWO) TIMES DAILY. 10/29/19   Burns, Claudina Lick, MD  KLOR-CON M20 20 MEQ tablet TAKE 2 TABLETS (40 MEQ TOTAL) BY MOUTH 3 (THREE) TIMES DAILY. Patient taking differently: 3 (three) times daily.  10/26/19   Binnie Rail, MD  levothyroxine (SYNTHROID) 50 MCG tablet Take 1 tablet (50 mcg total) by mouth daily. Need office visit for more refills. 01/03/20   Burns, Claudina Lick, MD  REGRANEX 0.01 % gel APPLY NICKEL THICK AMOUNT TO WOUND BED AND THEN COVER WITH A DRESSING AS DIRECTED IN CLINIC DAILY 12/13/19   [provider]  saccharomyces boulardii (FLORASTOR) 250 MG capsule Digest Probiotic (S.boulardii) 250 mg capsule  TAKE 1 CAPSULE (250 MG TOTAL) BY MOUTH 2 (TWO) TIMES DAILY FOR 14 DAYS.    [provider]  thiamine 100 MG tablet TAKE 1 TABLET BY MOUTH EVERY DAY 01/21/20   Binnie Rail, MD  traZODone (DESYREL) 50 MG  tablet TAKE 1-2 TABLETS (50-100 MG TOTAL) BY MOUTH AT BEDTIME AS NEEDED FOR SLEEP. 12/31/19   [name], MD  TURMERIC PO Take by mouth.    [provider]    Allergies  Allergen Reactions  . Claritin [Loratadine] Shortness Of Breath and Anxiety    Social History   Tobacco Use  . Smoking status: Former Smoker    Packs/day: 0.25    Years: 10.00    Pack years: 2.50    Types: Cigarettes  . Smokeless tobacco: Never Used  . Tobacco comment: quit 2018  Substance Use Topics  . Alcohol use: Yes    Comment: 2-3 drinks 4-5 nights a week, liquor  . Drug use: Yes    Frequency: 1.0 times per week    Types: Marijuana    Comment: "4 times a month, maybe"    Family History  Adopted: Yes  Family history unknown: Yes    Review of Systems  Constitutional: negative for fevers and chills Gastrointestinal: negative  for nausea and diarrhea All other systems reviewed and are negative    Constitutional: in no apparent distress There were no vitals filed for this visit. EYES: anicteric Musculoskeletal: right foot with open ulcer, 4 toes, no cellulitis Skin: negatives: chronic lower ext skin changes  Labs: Lab Results  Component Value Date   WBC 5.7 02/25/2020   HGB 13.7 02/25/2020   HCT 39.7 02/25/2020   MCV 96.3 02/25/2020   PLT 217.0 02/25/2020    Lab Results  Component Value Date   CREATININE 0.65 02/25/2020   BUN 5 (L) 02/25/2020   NA 137 02/25/2020   K 3.5 02/25/2020   CL 101 02/25/2020   CO2 28 02/25/2020    Lab Results  Component Value Date   ALT 32 02/25/2020   AST 42 (H) 02/25/2020   ALKPHOS 180 (H) 02/25/2020   BILITOT 0.8 02/25/2020     Assessment: osteomyelitis with abscess. For surgical debridment and amputation with source control.  No role for prolonged antibiotics.  Can take Augmentin post op for 2-3 days.    Plan: 1) surgical management. rtc as needed

## 2020-02-28 ENCOUNTER — Other Ambulatory Visit (HOSPITAL_COMMUNITY)
Admission: RE | Admit: 2020-02-28 | Discharge: 2020-02-28 | Disposition: A | Discharge status: Home / Self Care | Payer: Medicare Other | Source: Ambulatory Visit | Attending: Sports Medicine | Admitting: Sports Medicine

## 2020-02-28 DIAGNOSIS — Z01812 Encounter for preprocedural laboratory examination: Secondary | ICD-10-CM | POA: Diagnosis present

## 2020-02-28 DIAGNOSIS — Z20822 Contact with and (suspected) exposure to covid-19: Secondary | ICD-10-CM | POA: Insufficient documentation

## 2020-02-28 LAB — SARS CORONAVIRUS 2 (TAT 6-24 HRS): SARS Coronavirus 2: NEGATIVE

## 2020-03-03 ENCOUNTER — Ambulatory Visit (HOSPITAL_BASED_OUTPATIENT_CLINIC_OR_DEPARTMENT_OTHER): Payer: Medicare Other | Admitting: Anesthesiology

## 2020-03-03 ENCOUNTER — Other Ambulatory Visit: Payer: Self-pay

## 2020-03-03 ENCOUNTER — Encounter (HOSPITAL_COMMUNITY)
Admission: RE | Disposition: A | Discharge status: Home / Self Care | Payer: Self-pay | Source: Home / Self Care | Attending: Sports Medicine

## 2020-03-03 ENCOUNTER — Encounter (HOSPITAL_BASED_OUTPATIENT_CLINIC_OR_DEPARTMENT_OTHER): Payer: Self-pay | Admitting: Sports Medicine

## 2020-03-03 ENCOUNTER — Inpatient Hospital Stay: Admit: 2020-03-03 | Payer: Medicare Other | Admitting: Sports Medicine

## 2020-03-03 ENCOUNTER — Inpatient Hospital Stay (HOSPITAL_BASED_OUTPATIENT_CLINIC_OR_DEPARTMENT_OTHER)
Admission: RE | Admit: 2020-03-03 | Discharge: 2020-03-04 | DRG: 617 | Disposition: A | Discharge status: Home / Self Care | Payer: Medicare Other | Attending: Internal Medicine | Admitting: Internal Medicine

## 2020-03-03 ENCOUNTER — Ambulatory Visit (HOSPITAL_COMMUNITY): Payer: Medicare Other | Admitting: Anesthesiology

## 2020-03-03 ENCOUNTER — Telehealth: Payer: Self-pay | Admitting: *Deleted

## 2020-03-03 DIAGNOSIS — K7030 Alcoholic cirrhosis of liver without ascites: Secondary | ICD-10-CM | POA: Diagnosis not present

## 2020-03-03 DIAGNOSIS — D649 Anemia, unspecified: Secondary | ICD-10-CM | POA: Diagnosis present

## 2020-03-03 DIAGNOSIS — Z79890 Hormone replacement therapy (postmenopausal): Secondary | ICD-10-CM

## 2020-03-03 DIAGNOSIS — E11621 Type 2 diabetes mellitus with foot ulcer: Secondary | ICD-10-CM | POA: Diagnosis present

## 2020-03-03 DIAGNOSIS — M86171 Other acute osteomyelitis, right ankle and foot: Secondary | ICD-10-CM | POA: Diagnosis present

## 2020-03-03 DIAGNOSIS — E11610 Type 2 diabetes mellitus with diabetic neuropathic arthropathy: Secondary | ICD-10-CM | POA: Diagnosis not present

## 2020-03-03 DIAGNOSIS — Z89422 Acquired absence of other left toe(s): Secondary | ICD-10-CM

## 2020-03-03 DIAGNOSIS — M96830 Postprocedural hemorrhage and hematoma of a musculoskeletal structure following a musculoskeletal system procedure: Secondary | ICD-10-CM | POA: Diagnosis not present

## 2020-03-03 DIAGNOSIS — M21541 Acquired clubfoot, right foot: Secondary | ICD-10-CM

## 2020-03-03 DIAGNOSIS — D62 Acute posthemorrhagic anemia: Secondary | ICD-10-CM | POA: Diagnosis not present

## 2020-03-03 DIAGNOSIS — E662 Morbid (severe) obesity with alveolar hypoventilation: Secondary | ICD-10-CM

## 2020-03-03 DIAGNOSIS — G4733 Obstructive sleep apnea (adult) (pediatric): Secondary | ICD-10-CM | POA: Diagnosis present

## 2020-03-03 DIAGNOSIS — L97519 Non-pressure chronic ulcer of other part of right foot with unspecified severity: Secondary | ICD-10-CM | POA: Diagnosis present

## 2020-03-03 DIAGNOSIS — F1210 Cannabis abuse, uncomplicated: Secondary | ICD-10-CM | POA: Diagnosis not present

## 2020-03-03 DIAGNOSIS — I5042 Chronic combined systolic (congestive) and diastolic (congestive) heart failure: Secondary | ICD-10-CM | POA: Diagnosis present

## 2020-03-03 DIAGNOSIS — E1142 Type 2 diabetes mellitus with diabetic polyneuropathy: Secondary | ICD-10-CM | POA: Diagnosis not present

## 2020-03-03 DIAGNOSIS — Z96643 Presence of artificial hip joint, bilateral: Secondary | ICD-10-CM | POA: Diagnosis not present

## 2020-03-03 DIAGNOSIS — M19042 Primary osteoarthritis, left hand: Secondary | ICD-10-CM | POA: Diagnosis present

## 2020-03-03 DIAGNOSIS — F1010 Alcohol abuse, uncomplicated: Secondary | ICD-10-CM | POA: Diagnosis present

## 2020-03-03 DIAGNOSIS — I10 Essential (primary) hypertension: Secondary | ICD-10-CM | POA: Diagnosis present

## 2020-03-03 DIAGNOSIS — E1169 Type 2 diabetes mellitus with other specified complication: Principal | ICD-10-CM | POA: Diagnosis present

## 2020-03-03 DIAGNOSIS — I110 Hypertensive heart disease with heart failure: Secondary | ICD-10-CM | POA: Diagnosis not present

## 2020-03-03 DIAGNOSIS — E785 Hyperlipidemia, unspecified: Secondary | ICD-10-CM | POA: Diagnosis present

## 2020-03-03 DIAGNOSIS — Z8739 Personal history of other diseases of the musculoskeletal system and connective tissue: Secondary | ICD-10-CM | POA: Diagnosis present

## 2020-03-03 DIAGNOSIS — S98131A Complete traumatic amputation of one right lesser toe, initial encounter: Secondary | ICD-10-CM | POA: Diagnosis not present

## 2020-03-03 DIAGNOSIS — E782 Mixed hyperlipidemia: Secondary | ICD-10-CM

## 2020-03-03 DIAGNOSIS — Z79899 Other long term (current) drug therapy: Secondary | ICD-10-CM

## 2020-03-03 DIAGNOSIS — I5032 Chronic diastolic (congestive) heart failure: Secondary | ICD-10-CM | POA: Diagnosis present

## 2020-03-03 DIAGNOSIS — F329 Major depressive disorder, single episode, unspecified: Secondary | ICD-10-CM | POA: Diagnosis present

## 2020-03-03 DIAGNOSIS — M19041 Primary osteoarthritis, right hand: Secondary | ICD-10-CM | POA: Diagnosis not present

## 2020-03-03 DIAGNOSIS — Z9981 Dependence on supplemental oxygen: Secondary | ICD-10-CM | POA: Diagnosis not present

## 2020-03-03 DIAGNOSIS — Z6838 Body mass index (BMI) 38.0-38.9, adult: Secondary | ICD-10-CM

## 2020-03-03 DIAGNOSIS — R58 Hemorrhage, not elsewhere classified: Secondary | ICD-10-CM | POA: Diagnosis present

## 2020-03-03 DIAGNOSIS — Y838 Other surgical procedures as the cause of abnormal reaction of the patient, or of later complication, without mention of misadventure at the time of the procedure: Secondary | ICD-10-CM | POA: Diagnosis not present

## 2020-03-03 DIAGNOSIS — M869 Osteomyelitis, unspecified: Secondary | ICD-10-CM

## 2020-03-03 DIAGNOSIS — Z87891 Personal history of nicotine dependence: Secondary | ICD-10-CM

## 2020-03-03 DIAGNOSIS — Z20822 Contact with and (suspected) exposure to covid-19: Secondary | ICD-10-CM | POA: Diagnosis present

## 2020-03-03 DIAGNOSIS — R7303 Prediabetes: Secondary | ICD-10-CM | POA: Diagnosis present

## 2020-03-03 DIAGNOSIS — E039 Hypothyroidism, unspecified: Secondary | ICD-10-CM | POA: Diagnosis not present

## 2020-03-03 HISTORY — PX: METATARSAL HEAD EXCISION: SHX5027

## 2020-03-03 HISTORY — PX: GRAFT APPLICATION: SHX6696

## 2020-03-03 HISTORY — PX: INCISION AND DRAINAGE OF WOUND: SHX1803

## 2020-03-03 LAB — POCT I-STAT, CHEM 8
BUN: 8 mg/dL (ref 6–20)
Calcium, Ion: 1.15 mmol/L (ref 1.15–1.40)
Chloride: 100 mmol/L (ref 98–111)
Creatinine, Ser: 0.6 mg/dL — ABNORMAL LOW (ref 0.61–1.24)
Glucose, Bld: 141 mg/dL — ABNORMAL HIGH (ref 70–99)
HCT: 32 % — ABNORMAL LOW (ref 39.0–52.0)
Hemoglobin: 10.9 g/dL — ABNORMAL LOW (ref 13.0–17.0)
Potassium: 3.6 mmol/L (ref 3.5–5.1)
Sodium: 140 mmol/L (ref 135–145)
TCO2: 28 mmol/L (ref 22–32)

## 2020-03-03 LAB — CBC WITH DIFFERENTIAL/PLATELET
Abs Immature Granulocytes: 0.02 10*3/uL (ref 0.00–0.07)
Basophils Absolute: 0 10*3/uL (ref 0.0–0.1)
Basophils Relative: 0 %
Eosinophils Absolute: 0 10*3/uL (ref 0.0–0.5)
Eosinophils Relative: 1 %
HCT: 32.3 % — ABNORMAL LOW (ref 39.0–52.0)
Hemoglobin: 10.8 g/dL — ABNORMAL LOW (ref 13.0–17.0)
Immature Granulocytes: 0 %
Lymphocytes Relative: 18 %
Lymphs Abs: 1.1 10*3/uL (ref 0.7–4.0)
MCH: 32.9 pg (ref 26.0–34.0)
MCHC: 33.4 g/dL (ref 30.0–36.0)
MCV: 98.5 fL (ref 80.0–100.0)
Monocytes Absolute: 0.6 10*3/uL (ref 0.1–1.0)
Monocytes Relative: 9 %
Neutro Abs: 4.2 10*3/uL (ref 1.7–7.7)
Neutrophils Relative %: 72 %
Platelets: 155 10*3/uL (ref 150–400)
RBC: 3.28 MIL/uL — ABNORMAL LOW (ref 4.22–5.81)
RDW: 14.3 % (ref 11.5–15.5)
WBC: 6 10*3/uL (ref 4.0–10.5)
nRBC: 0 % (ref 0.0–0.2)

## 2020-03-03 LAB — COMPREHENSIVE METABOLIC PANEL
ALT: 36 U/L (ref 0–44)
AST: 52 U/L — ABNORMAL HIGH (ref 15–41)
Albumin: 2.8 g/dL — ABNORMAL LOW (ref 3.5–5.0)
Alkaline Phosphatase: 133 U/L — ABNORMAL HIGH (ref 38–126)
Anion gap: 10 (ref 5–15)
BUN: 8 mg/dL (ref 6–20)
CO2: 26 mmol/L (ref 22–32)
Calcium: 8.5 mg/dL — ABNORMAL LOW (ref 8.9–10.3)
Chloride: 104 mmol/L (ref 98–111)
Creatinine, Ser: 0.75 mg/dL (ref 0.61–1.24)
GFR calc Af Amer: >60 mL/min (ref >60)
GFR calc non Af Amer: >60 mL/min (ref >60)
Glucose, Bld: 131 mg/dL — ABNORMAL HIGH (ref 70–99)
Potassium: 4.4 mmol/L (ref 3.5–5.1)
Sodium: 140 mmol/L (ref 135–145)
Total Bilirubin: 1.7 mg/dL — ABNORMAL HIGH (ref 0.3–1.2)
Total Protein: 6.9 g/dL (ref 6.5–8.1)

## 2020-03-03 LAB — GLUCOSE, CAPILLARY
Glucose-Capillary: 131 mg/dL — ABNORMAL HIGH (ref 70–99)
Glucose-Capillary: 149 mg/dL — ABNORMAL HIGH (ref 70–99)

## 2020-03-03 LAB — SARS CORONAVIRUS 2 BY RT PCR (HOSPITAL ORDER, PERFORMED IN ~~LOC~~ HOSPITAL LAB): SARS Coronavirus 2: NEGATIVE

## 2020-03-03 SURGERY — EXCISION, METATARSAL BONE, HEAD
Anesthesia: Monitor Anesthesia Care | Site: Toe | Laterality: Right

## 2020-03-03 MED ORDER — TRAZODONE HCL 50 MG PO TABS
50.0000 mg | ORAL_TABLET | Freq: Every evening | ORAL | Status: DC | PRN
  Administered 2020-03-03: 100 mg via ORAL
  Filled 2020-03-03: qty 2

## 2020-03-03 MED ORDER — SODIUM CHLORIDE 0.9% FLUSH: 3.0000 mL | INTRAVENOUS | Status: DC | PRN

## 2020-03-03 MED ORDER — OXYCODONE HCL 5 MG/5ML PO SOLN: 5.0000 mg | Freq: Once | ORAL | Status: DC | PRN

## 2020-03-03 MED ORDER — HEMOSTATIC AGENTS (NO CHARGE) OPTIME
TOPICAL | Status: DC | PRN
  Administered 2020-03-03 (×2): 1

## 2020-03-03 MED ORDER — FUROSEMIDE 40 MG PO TABS
40.0000 mg | ORAL_TABLET | Freq: Two times a day (BID) | ORAL | Status: DC
  Administered 2020-03-04: 40 mg via ORAL
  Filled 2020-03-03: qty 1

## 2020-03-03 MED ORDER — DULOXETINE HCL 20 MG PO CPEP
20.0000 mg | ORAL_CAPSULE | Freq: Every day | ORAL | Status: DC
  Administered 2020-03-04: 20 mg via ORAL
  Filled 2020-03-03: qty 1

## 2020-03-03 MED ORDER — HYDROCODONE-ACETAMINOPHEN 10-325 MG PO TABS: 1.0000 | ORAL_TABLET | Freq: Four times a day (QID) | ORAL | 0 refills | Status: AC | PRN

## 2020-03-03 MED ORDER — 0.9 % SODIUM CHLORIDE (POUR BTL) OPTIME
TOPICAL | Status: DC | PRN
  Administered 2020-03-03: 1000 mL

## 2020-03-03 MED ORDER — ACETAMINOPHEN 500 MG PO TABS
ORAL_TABLET | ORAL | Status: AC
  Filled 2020-03-03: qty 1

## 2020-03-03 MED ORDER — CHLORHEXIDINE GLUCONATE CLOTH 2 % EX PADS: 6.0000 | MEDICATED_PAD | Freq: Once | CUTANEOUS | Status: DC

## 2020-03-03 MED ORDER — LIDOCAINE 2% (20 MG/ML) 5 ML SYRINGE
INTRAMUSCULAR | Status: DC | PRN
  Administered 2020-03-03: 60 mg via INTRAVENOUS

## 2020-03-03 MED ORDER — SODIUM CHLORIDE 0.9 % IV SOLN: 250.0000 mL | INTRAVENOUS | Status: DC | PRN

## 2020-03-03 MED ORDER — HYDROMORPHONE HCL 1 MG/ML IJ SOLN: 0.2500 mg | INTRAMUSCULAR | Status: DC | PRN

## 2020-03-03 MED ORDER — ONDANSETRON HCL 4 MG/2ML IJ SOLN
INTRAMUSCULAR | Status: AC
  Filled 2020-03-03: qty 2

## 2020-03-03 MED ORDER — LIDOCAINE HCL 1 % IJ SOLN
INTRAMUSCULAR | Status: DC | PRN
  Administered 2020-03-03: 10 mL via INTRAMUSCULAR

## 2020-03-03 MED ORDER — OXYCODONE HCL 5 MG PO TABS: 5.0000 mg | ORAL_TABLET | Freq: Once | ORAL | Status: DC | PRN

## 2020-03-03 MED ORDER — ONDANSETRON HCL 4 MG PO TABS: 4.0000 mg | ORAL_TABLET | Freq: Four times a day (QID) | ORAL | Status: DC | PRN

## 2020-03-03 MED ORDER — ROCURONIUM BROMIDE 10 MG/ML (PF) SYRINGE
PREFILLED_SYRINGE | INTRAVENOUS | Status: AC
  Filled 2020-03-03: qty 10

## 2020-03-03 MED ORDER — ONDANSETRON HCL 4 MG/2ML IJ SOLN
INTRAMUSCULAR | Status: AC
  Filled 2020-03-03: qty 4

## 2020-03-03 MED ORDER — PROMETHAZINE HCL 50 MG PO TABS
50.0000 mg | ORAL_TABLET | Freq: Four times a day (QID) | ORAL | 0 refills | Status: DC | PRN
Start: 2020-03-03 — End: 2020-11-24

## 2020-03-03 MED ORDER — FENTANYL CITRATE (PF) 100 MCG/2ML IJ SOLN
INTRAMUSCULAR | Status: AC
  Filled 2020-03-03: qty 2

## 2020-03-03 MED ORDER — SODIUM CHLORIDE 0.9% FLUSH
3.0000 mL | Freq: Two times a day (BID) | INTRAVENOUS | Status: DC
  Administered 2020-03-04: 3 mL via INTRAVENOUS

## 2020-03-03 MED ORDER — MIDAZOLAM HCL 5 MG/5ML IJ SOLN
INTRAMUSCULAR | Status: DC | PRN
  Administered 2020-03-03: 2 mg via INTRAVENOUS

## 2020-03-03 MED ORDER — DOCUSATE SODIUM 100 MG PO CAPS
100.0000 mg | ORAL_CAPSULE | Freq: Two times a day (BID) | ORAL | Status: DC
  Administered 2020-03-04: 100 mg via ORAL
  Filled 2020-03-03: qty 1

## 2020-03-03 MED ORDER — BUPIVACAINE HCL 0.25 % IJ SOLN
INTRAMUSCULAR | Status: DC | PRN
  Administered 2020-03-03: 5 mL

## 2020-03-03 MED ORDER — MIDAZOLAM HCL 2 MG/2ML IJ SOLN
INTRAMUSCULAR | Status: AC
  Filled 2020-03-03: qty 2

## 2020-03-03 MED ORDER — LACTATED RINGERS IV SOLN: INTRAVENOUS | Status: DC | PRN

## 2020-03-03 MED ORDER — ACETAMINOPHEN 325 MG PO TABS: 650.0000 mg | ORAL_TABLET | Freq: Four times a day (QID) | ORAL | Status: DC | PRN

## 2020-03-03 MED ORDER — ACETAMINOPHEN 650 MG RE SUPP: 650.0000 mg | Freq: Four times a day (QID) | RECTAL | Status: DC | PRN

## 2020-03-03 MED ORDER — SODIUM CHLORIDE 0.9% FLUSH
3.0000 mL | Freq: Two times a day (BID) | INTRAVENOUS | Status: DC
  Administered 2020-03-04 (×2): 3 mL via INTRAVENOUS

## 2020-03-03 MED ORDER — FENTANYL CITRATE (PF) 100 MCG/2ML IJ SOLN
INTRAMUSCULAR | Status: DC | PRN
  Administered 2020-03-03: 50 ug via INTRAVENOUS

## 2020-03-03 MED ORDER — LIDOCAINE HCL (CARDIAC) PF 100 MG/5ML IV SOSY
PREFILLED_SYRINGE | INTRAVENOUS | Status: DC | PRN
  Administered 2020-03-03: 40 mg via INTRAVENOUS

## 2020-03-03 MED ORDER — DOCUSATE SODIUM 100 MG PO CAPS
100.0000 mg | ORAL_CAPSULE | Freq: Every day | ORAL | 2 refills | Status: DC | PRN
Start: 2020-03-03 — End: 2020-10-26

## 2020-03-03 MED ORDER — PROPOFOL 500 MG/50ML IV EMUL
INTRAVENOUS | Status: DC | PRN
  Administered 2020-03-03: 75 ug/kg/min via INTRAVENOUS

## 2020-03-03 MED ORDER — ONDANSETRON HCL 4 MG/2ML IJ SOLN
INTRAMUSCULAR | Status: DC | PRN
  Administered 2020-03-03: 4 mg via INTRAVENOUS

## 2020-03-03 MED ORDER — ONDANSETRON HCL 4 MG/2ML IJ SOLN: 4.0000 mg | Freq: Four times a day (QID) | INTRAMUSCULAR | Status: DC | PRN

## 2020-03-03 MED ORDER — ONDANSETRON HCL 4 MG/2ML IJ SOLN: 4.0000 mg | Freq: Once | INTRAMUSCULAR | Status: DC | PRN

## 2020-03-03 MED ORDER — HYDROCODONE-ACETAMINOPHEN 5-325 MG PO TABS: 1.0000 | ORAL_TABLET | ORAL | Status: DC | PRN

## 2020-03-03 MED ORDER — LIDOCAINE HCL (PF) 1 % IJ SOLN
INTRAMUSCULAR | Status: DC | PRN
  Administered 2020-03-03: 5 mL

## 2020-03-03 MED ORDER — KETOROLAC TROMETHAMINE 30 MG/ML IJ SOLN: 15.0000 mg | Freq: Once | INTRAMUSCULAR | Status: DC | PRN

## 2020-03-03 MED ORDER — INSULIN ASPART 100 UNIT/ML ~~LOC~~ SOLN: 0.0000 [IU] | Freq: Three times a day (TID) | SUBCUTANEOUS | Status: DC

## 2020-03-03 MED ORDER — SILVER NITRATE-POT NITRATE 75-25 % EX MISC
CUTANEOUS | Status: AC
  Filled 2020-03-03: qty 10

## 2020-03-03 MED ORDER — CEFAZOLIN SODIUM-DEXTROSE 2-4 GM/100ML-% IV SOLN
INTRAVENOUS | Status: AC
  Filled 2020-03-03: qty 100

## 2020-03-03 MED ORDER — ACETAMINOPHEN 500 MG PO TABS
1000.0000 mg | ORAL_TABLET | Freq: Once | ORAL | Status: AC
  Administered 2020-03-03: 1000 mg via ORAL

## 2020-03-03 MED ORDER — LEVOTHYROXINE SODIUM 50 MCG PO TABS
50.0000 ug | ORAL_TABLET | Freq: Every day | ORAL | Status: DC
  Administered 2020-03-04: 50 ug via ORAL
  Filled 2020-03-03: qty 1

## 2020-03-03 MED ORDER — ONDANSETRON HCL 4 MG/2ML IJ SOLN
4.0000 mg | Freq: Once | INTRAMUSCULAR | Status: AC | PRN
  Administered 2020-03-03: 4 mg via INTRAVENOUS

## 2020-03-03 MED ORDER — SODIUM CHLORIDE 0.9 % IV SOLN: INTRAVENOUS | Status: DC | PRN

## 2020-03-03 MED ORDER — INSULIN ASPART 100 UNIT/ML ~~LOC~~ SOLN: 0.0000 [IU] | Freq: Every day | SUBCUTANEOUS | Status: DC

## 2020-03-03 MED ORDER — FENTANYL CITRATE (PF) 100 MCG/2ML IJ SOLN
INTRAMUSCULAR | Status: DC | PRN
  Administered 2020-03-03: 100 ug via INTRAVENOUS

## 2020-03-03 MED ORDER — DEXTROSE 5 % IV SOLN
3.0000 g | INTRAVENOUS | Status: AC
  Administered 2020-03-03: 3 g via INTRAVENOUS

## 2020-03-03 MED ORDER — FENTANYL CITRATE (PF) 250 MCG/5ML IJ SOLN
INTRAMUSCULAR | Status: AC
  Filled 2020-03-03: qty 5

## 2020-03-03 MED ORDER — CEFAZOLIN SODIUM-DEXTROSE 1-4 GM/50ML-% IV SOLN
INTRAVENOUS | Status: AC
  Filled 2020-03-03: qty 50

## 2020-03-03 MED ORDER — LACTATED RINGERS IV SOLN: INTRAVENOUS | Status: DC

## 2020-03-03 SURGICAL SUPPLY — 66 items
BLADE AVERAGE 25X9 (BLADE) ×3 IMPLANT
BLADE SURG 15 STRL LF DISP TIS (BLADE) ×4 IMPLANT
BLADE SURG 15 STRL SS (BLADE) ×2
BNDG COHESIVE 3X5 TAN STRL LF (GAUZE/BANDAGES/DRESSINGS) IMPLANT
BNDG CONFORM 2 STRL LF (GAUZE/BANDAGES/DRESSINGS) ×3 IMPLANT
BNDG ELASTIC 3X5.8 VLCR STR LF (GAUZE/BANDAGES/DRESSINGS) ×3 IMPLANT
BNDG ESMARK 4X9 LF (GAUZE/BANDAGES/DRESSINGS) ×3 IMPLANT
BNDG GAUZE ELAST 4 BULKY (GAUZE/BANDAGES/DRESSINGS) ×6 IMPLANT
BOOT STEPPER DURA XLG (SOFTGOODS) ×3 IMPLANT
CAUTERY EYE LOW TEMP 1300F FIN (OPHTHALMIC RELATED) ×3 IMPLANT
COVER BACK TABLE 60X90IN (DRAPES) ×3 IMPLANT
COVER WAND RF STERILE (DRAPES) IMPLANT
CUFF TOURN SGL QUICK 18X4 (TOURNIQUET CUFF) IMPLANT
CUFF TOURN SGL QUICK 24 (TOURNIQUET CUFF) ×1
CUFF TRNQT CYL 24X4X16.5-23 (TOURNIQUET CUFF) ×2 IMPLANT
DRAPE EXTREMITY T 121X128X90 (DISPOSABLE) ×3 IMPLANT
DRAPE IMP U-DRAPE 54X76 (DRAPES) ×6 IMPLANT
DRAPE OEC MINIVIEW 54X84 (DRAPES) ×3 IMPLANT
DRAPE SURG 17X23 STRL (DRAPES) ×3 IMPLANT
DRAPE U-SHAPE 47X51 STRL (DRAPES) ×3 IMPLANT
DRSG EMULSION OIL 3X3 NADH (GAUZE/BANDAGES/DRESSINGS) ×3 IMPLANT
DRSG PAD ABDOMINAL 8X10 ST (GAUZE/BANDAGES/DRESSINGS) ×6 IMPLANT
DURAPREP 26ML APPLICATOR (WOUND CARE) ×3 IMPLANT
ELECT REM PT RETURN 9FT ADLT (ELECTROSURGICAL) ×3
ELECTRODE REM PT RTRN 9FT ADLT (ELECTROSURGICAL) ×2 IMPLANT
GAUZE 4X4 16PLY RFD (DISPOSABLE) IMPLANT
GAUZE SPONGE 4X4 12PLY STRL (GAUZE/BANDAGES/DRESSINGS) ×9 IMPLANT
GAUZE XEROFORM 1X8 LF (GAUZE/BANDAGES/DRESSINGS) IMPLANT
GLOVE BIO SURGEON STRL SZ 6.5 (GLOVE) ×3 IMPLANT
GLOVE BIOGEL PI IND STRL 6.5 (GLOVE) ×4 IMPLANT
GLOVE BIOGEL PI INDICATOR 6.5 (GLOVE) ×2
GOWN STRL REUS W/ TWL LRG LVL3 (GOWN DISPOSABLE) ×4 IMPLANT
GOWN STRL REUS W/TWL LRG LVL3 (GOWN DISPOSABLE) ×2
HEMOSTAT ARISTA ABSORB 3G PWDR (HEMOSTASIS) ×3 IMPLANT
HEMOSTAT SURGICEL 2X14 (HEMOSTASIS) ×3 IMPLANT
IMPL STRAVIX 3X6 (Tissue) ×2 IMPLANT
IMPLANT STRAVIX 3X6 (Tissue) ×3 IMPLANT
NDL SAFETY ECLIPSE 18X1.5 (NEEDLE) IMPLANT
NEEDLE HYPO 18GX1.5 SHARP (NEEDLE)
NEEDLE HYPO 25X1 1.5 SAFETY (NEEDLE) ×6 IMPLANT
NS IRRIG 1000ML POUR BTL (IV SOLUTION) IMPLANT
PADDING CAST ABS 4INX4YD NS (CAST SUPPLIES) ×1
PADDING CAST ABS COTTON 4X4 ST (CAST SUPPLIES) ×2 IMPLANT
PENCIL SMOKE EVACUATOR (MISCELLANEOUS) ×3 IMPLANT
SET BASIN DAY SURGERY F.S. (CUSTOM PROCEDURE TRAY) ×3 IMPLANT
SLEEVE SCD COMPRESS KNEE MED (MISCELLANEOUS) ×3 IMPLANT
SPONGE LAP 4X18 RFD (DISPOSABLE) ×6 IMPLANT
SPONGE SURGIFOAM ABS GEL 100 (HEMOSTASIS) ×6 IMPLANT
STOCKINETTE 6  STRL (DRAPES) ×1
STOCKINETTE 6 STRL (DRAPES) ×2 IMPLANT
STOCKINETTE SYNTHETIC 4 NONSTR (MISCELLANEOUS) ×3 IMPLANT
STRIP SUTURE WOUND CLOSURE 1/2 (MISCELLANEOUS) IMPLANT
SUT ETHILON 3 0 PS 1 (SUTURE) ×3 IMPLANT
SUT ETHILON 4 0 PS 2 18 (SUTURE) IMPLANT
SUT MERSILENE 2.0 SH NDLE (SUTURE) ×3 IMPLANT
SUT MNCRL AB 3-0 PS2 18 (SUTURE) ×3 IMPLANT
SUT MNCRL AB 4-0 PS2 18 (SUTURE) IMPLANT
SUT MON AB 5-0 PS2 18 (SUTURE) ×3 IMPLANT
SUT VIC AB 3-0 FS2 27 (SUTURE) ×3 IMPLANT
SUT VIC AB 3-0 SH 27 (SUTURE) ×1
SUT VIC AB 3-0 SH 27X BRD (SUTURE) ×2 IMPLANT
SUT VICRYL 4-0 PS2 18IN ABS (SUTURE) ×3 IMPLANT
SYR 10ML LL (SYRINGE) ×6 IMPLANT
SYR BULB EAR ULCER 3OZ GRN STR (SYRINGE) ×3 IMPLANT
TOWEL GREEN STERILE FF (TOWEL DISPOSABLE) ×3 IMPLANT
UNDERPAD 30X36 HEAVY ABSORB (UNDERPADS AND DIAPERS) ×3 IMPLANT

## 2020-03-03 SURGICAL SUPPLY — 39 items
BLADE SURG 10 STRL SS (BLADE) IMPLANT
BNDG COHESIVE 4X5 TAN STRL (GAUZE/BANDAGES/DRESSINGS) ×2 IMPLANT
BNDG ELASTIC 4X5.8 VLCR STR LF (GAUZE/BANDAGES/DRESSINGS) ×2 IMPLANT
BNDG ESMARK 4X9 LF (GAUZE/BANDAGES/DRESSINGS) IMPLANT
BNDG GAUZE ELAST 4 BULKY (GAUZE/BANDAGES/DRESSINGS) ×2 IMPLANT
COVER SURGICAL LIGHT HANDLE (MISCELLANEOUS) ×2 IMPLANT
COVER WAND RF STERILE (DRAPES) ×2 IMPLANT
CUFF TOURN SGL QUICK 18X4 (TOURNIQUET CUFF) ×2 IMPLANT
DRSG EMULSION OIL 3X3 NADH (GAUZE/BANDAGES/DRESSINGS) ×2 IMPLANT
DRSG PAD ABDOMINAL 8X10 ST (GAUZE/BANDAGES/DRESSINGS) ×2 IMPLANT
DURAPREP 26ML APPLICATOR (WOUND CARE) ×2 IMPLANT
GAUZE SPONGE 4X4 12PLY STRL (GAUZE/BANDAGES/DRESSINGS) ×2 IMPLANT
GAUZE SPONGE 4X4 12PLY STRL LF (GAUZE/BANDAGES/DRESSINGS) ×2 IMPLANT
GLOVE BIO SURGEON STRL SZ 6.5 (GLOVE) ×2 IMPLANT
GLOVE BIO SURGEON STRL SZ7.5 (GLOVE) ×2 IMPLANT
GLOVE BIOGEL PI IND STRL 7.5 (GLOVE) ×1 IMPLANT
GLOVE BIOGEL PI INDICATOR 7.5 (GLOVE) ×1
GLOVE INDICATOR 6.5 STRL GRN (GLOVE) ×2 IMPLANT
GOWN STRL REUS W/ TWL LRG LVL3 (GOWN DISPOSABLE) ×2 IMPLANT
GOWN STRL REUS W/TWL LRG LVL3 (GOWN DISPOSABLE) ×2
HEMOSTAT SURGICEL 2X14 (HEMOSTASIS) ×2 IMPLANT
KIT BASIN OR (CUSTOM PROCEDURE TRAY) ×2 IMPLANT
KIT TURNOVER KIT B (KITS) ×2 IMPLANT
NEEDLE HYPO 25GX1X1/2 BEV (NEEDLE) ×2 IMPLANT
NS IRRIG 1000ML POUR BTL (IV SOLUTION) ×2 IMPLANT
PACK ORTHO EXTREMITY (CUSTOM PROCEDURE TRAY) ×2 IMPLANT
PAD ABD 8X10 STRL (GAUZE/BANDAGES/DRESSINGS) ×2 IMPLANT
PAD ARMBOARD 7.5X6 YLW CONV (MISCELLANEOUS) ×4 IMPLANT
PENCIL BUTTON HOLSTER BLD 10FT (ELECTRODE) ×2 IMPLANT
SPONGE SURGIFOAM ABS GEL 100C (HEMOSTASIS) ×2 IMPLANT
SUT BONE WAX W31G (SUTURE) ×2 IMPLANT
SUT PROLENE 3 0 PS 2 (SUTURE) ×4 IMPLANT
SUT VIC AB 2-0 SH 27 (SUTURE) ×1
SUT VIC AB 2-0 SH 27XBRD (SUTURE) ×1 IMPLANT
SUT VIC AB 3-0 54X BRD REEL (SUTURE) ×2 IMPLANT
SUT VIC AB 3-0 BRD 54 (SUTURE) ×2
SUT VICRYL 0 TIES 12 18 (SUTURE) ×2 IMPLANT
SYR CONTROL 10ML LL (SYRINGE) ×2 IMPLANT
TUBE CONNECTING 12X1/4 (SUCTIONS) ×2 IMPLANT

## 2020-03-03 NOTE — Progress Notes (Addendum)
Dr. Marylene Land at bedside to assess the incisional bleeding.

## 2020-03-03 NOTE — Anesthesia Preprocedure Evaluation (Signed)
Anesthesia Evaluation  Patient identified by MRN, date of birth, ID band Patient awake    Reviewed: Allergy & Precautions, NPO status , Patient's Chart, lab work & pertinent test results  History of Anesthesia Complications Negative for: history of anesthetic complications  Airway Mallampati: III  TM Distance: >3 FB Neck ROM: Full    Dental  (+) Chipped, Dental Advisory Given,    Pulmonary sleep apnea , former smoker,  Does not tolerate CPAP, has not used in 2-3years- now using oxygen at night via [place] 2LPM Former light smoker- no inhalers   Pulmonary exam normal breath sounds clear to auscultation       Cardiovascular hypertension, +CHF and + DOE  Normal cardiovascular exam Rhythm:Regular Rate:Normal  Last echo 04/2019: 1. The left ventricle has normal systolic function with an ejection  fraction of 60-65%. The cavity size was mildly dilated. Left ventricular  diastolic parameters were normal. No evidence of left ventricular regional  wall motion abnormalities.  2. The right ventricle has normal systolic function. The cavity was  normal. There is no increase in right ventricular wall thickness.  3. Left atrial size was not well visualized.  4. The aortic valve is tricuspid. Moderate sclerosis of the aortic valve.  Was taken off BP meds by PCP about a month ago because BP was ok in office- HTN to 150/92 in preop   Neuro/Psych PSYCHIATRIC DISORDERS Depression Peripheral neuropathy- B/L feet, some fingers     GI/Hepatic GERD  Controlled,(+)     substance abuse  alcohol use and marijuana use, Hx diverticulitis s/p parital colectomy    Endo/Other  diabetes, Well Controlled, Type 2, Oral Hypoglycemic AgentsHypothyroidism Last a1c 5.7 Obesity BMI 38  Renal/GU CRFRenal disease  negative genitourinary   Musculoskeletal  (+) Arthritis , Osteoarthritis,  Abscess right foot, osteomyelitis    Abdominal (+) + obese,   Peds   Hematology  (+) anemia , HLD   Anesthesia Other Findings   Reproductive/Obstetrics negative OB ROS                             Anesthesia Physical Anesthesia Plan  ASA: III  Anesthesia Plan: MAC   Post-op Pain Management:    Induction:   PONV Risk Score and Plan: 2 and Propofol infusion and TIVA  Airway Management Planned: Natural Airway and Simple Face Mask  Additional Equipment: None  Intra-op Plan:   Post-operative Plan:   Informed Consent: I have reviewed the patients History and Physical, chart, labs and discussed the procedure including the risks, benefits and alternatives for the proposed anesthesia with the patient or authorized representative who has indicated his/her understanding and acceptance.       Plan Discussed with: CRNA  Anesthesia Plan Comments: (MAC and local by surgeon, pt has no sensation in operative foot. Will check H/H and give blood if necessary, d/w patient.)        Anesthesia Quick Evaluation

## 2020-03-03 NOTE — Op Note (Signed)
DATE: 03-03-20  SURGEON: Landis Martins, DPM  ASSISTANT: Celesta Gentile, DPM (assisted with retraction, suction, and denitrifying bleeding vessels during the procedure)   PREOPERATIVE DIAGNOSIS:Excessive bleeding from surgical site, right foot  S/p Right 2nd metatarsal head resection and plantar wound debridement with placement of graft earlier this AM  POSTOPERATIVE DIAGNOSIS: Same  PROCEDURE PERFORMED: Right foot irrigation and cauterization of bleeding blood vessels with placement of surgicel hemostatic agent (foam and mesh) in wound bed  HEMOSTASIS:  Right ankle tourniquet  ESTIMATED BLOOD LOSS: 100cc during case with at least 200cc prior to OR for a total of 300cc blood lost since AM   ANESTHESIA:  MAC with local 10cc of 1:1 mixture 1% lidocaine and 0.5% marcaine plain  COMPLICATIONS:  None.  INDICATIONS FOR PROCEDURE:  This patient is a pleasant 59 y.o. Pre/diabetic with neuropathy male who was transferred from [facility] day to main for excessive bleeding that would not stop with bedside attempt to cauterize and clamp off bleeders.  PROCEDURE IN DETAIL:  Under mild sedation the patient was brought into the operating room and placed on operating table in supine position.  A well-padded right pneumatic ankle tourniquet was placed about the patient's right ankle.After adequate anesthesia the right foot was then prepped and draped in the normal sterile manner.  Attention was directed to the right foot at the dorsal second metatarsophalangeal joint.  Previous sutures were removed and a significant hematoma was noted below the suture line after evacuation of a hematoma with suction bleeders were cauterized using Bovie.  Following the previous 2nd metatarsal head resection site was inspected and bone wax was placed over the canal of the bone to also stop bleeding. Attention was then directed plantarly where Stravix Graft was intact at the periphery and central wound bed was re-open to evacuate  hematoma at graft site. Using a 15 blade the plantar graft was incised draining hematoma from the site. Bleeders were cauterized with bovie.  The wound was then reapproximated dorsally utilizing layered fashion technique of 2-0 Vicryl and 3-0 prolene.  Attention was then redirected to the plantar aspect of the right foot where surgicel was placed in the central wound bed. Tourniquet was deflated and no puslatile bleeding was noted.  The area then was dressed with Betadine-soaked gauze dorsally and Adaptic plantarly as well as sterile compressive dressing consisting of 4 x 4's and Kling and Coban and ace wrap.  The pneumatic ankle tourniquet was released.  A prompt hyperemic response was noted to all digits of the right foot following a period of postoperative monitoring.  The patient tolerated the procedure well.  The patient was transferred from the OR to the recovery room.  Vital signs were stable, and neurovascular status was intact to all digits of the right foot. The patient to be admitted for Observation.   Patient has post op boot and instructions and meds previously sent to pharmacy from this AM.  Landis Martins, DPM

## 2020-03-03 NOTE — Brief Op Note (Signed)
03/03/2020  10:18 AM  PATIENT:  [name]  59 y.o. male  PRE-OPERATIVE DIAGNOSIS:  ABCESS RIGHT FOOT AND OSTEOMYLETITIS  POST-OPERATIVE DIAGNOSIS:  ABCESS RIGHT FOOT AND OSTEOMYLETITIS  PROCEDURE:  Procedure(s) with comments: METATARSAL HEAD EXCISION SECOND TOE RIGHT (Right) - MAC WITH LOCAL IRRIGATION AND DEBRIDEMENT WOUND (Right) GRAFT APPLICATION (Right)  SURGEON:  Surgeon(s) and Role:     Landis Martins, DPM - Primary  PHYSICIAN ASSISTANT:   ASSISTANTS: none   ANESTHESIA:   MAC  EBL:  50cc   BLOOD ADMINISTERED:none  DRAINS: none   LOCAL MEDICATIONS USED:  MARCAINE     SPECIMEN:  Source of Specimen:  right 2nd metatarsal head  DISPOSITION OF SPECIMEN:  PATHOLOGY  COUNTS:  YES  TOURNIQUET:   Total Tourniquet Time Documented: Calf (Right) - 54 minutes Total: Calf (Right) - 54 minutes   DICTATION: .Note written in EPIC  PLAN OF CARE: Discharge to home after PACU  PATIENT DISPOSITION:  PACU - hemodynamically stable.   Delay start of Pharmacological VTE agent (>24hrs) due to surgical blood loss or risk of bleeding: no

## 2020-03-03 NOTE — Anesthesia Procedure Notes (Signed)
Procedure Name: MAC Date/Time: 03/03/2020 9:15 AM Performed by: Signe Colt, CRNA Pre-anesthesia Checklist: Patient identified, Emergency Drugs available, Suction available, Patient being monitored and Timeout performed Patient Re-evaluated:Patient Re-evaluated prior to induction Oxygen Delivery Method: Simple face mask

## 2020-03-03 NOTE — Progress Notes (Addendum)
Dr. Marylene Land redressed the right lower extremity incision. After Dr. Marylene Land asked the patient to ambulate around the unit, the patient had continued incisional bleeding through his pressure dressing. Dr. Marylene Land removed the dressing again and attemped to stop the bleeding with the assistance of [facility] OR nurses. After multiple attempts to stop the bleeding, it was decided to transport the patient to [facility] for surgery by Dr. Marylene Land. Pressure dressing applied by Dr. Marylene Land for transport. [facility] notified. [place] OR notified. [name] (spouse) aware and at bedside.

## 2020-03-03 NOTE — OR Nursing (Signed)
1515:  OR RN requested by PACU staff to assist with dressing change and to assist with supplies while surgeon cauterized right foot with battery cautery.  OR RN in to assist, creating sterile field.  When dressing was removed, large amount of bleeding noted.  Surgeon requested suture, clamps, additional dressings, and tourniquet.  Tourniquet applied to lower right ankle.  Tourniquet inflated to at 1528 per surgeon order.  After surgeon had completed cauterization, irrigating wound, and no further bleeding was noted, tourniquet was released at 1551.  Arista, Gelfoam, and gauze applied.  Bleeding continued.  Additional pressure dressing applied and decision made to transport patient to [facility].  Patient stable without complaints.

## 2020-03-03 NOTE — Anesthesia Postprocedure Evaluation (Signed)
Anesthesia Post Note  Patient: [name]  Procedure(s) Performed: IRRIGATION OF TOE AND CAUTERIZATION OF BLEEDING TOE (Right Toe)     Patient location during evaluation: PACU Anesthesia Type: MAC Level of consciousness: awake and alert Pain management: pain level controlled Vital Signs Assessment: post-procedure vital signs reviewed and stable Respiratory status: spontaneous breathing, nonlabored ventilation, respiratory function stable and patient connected to nasal cannula oxygen Cardiovascular status: stable and blood pressure returned to baseline Postop Assessment: no apparent nausea or vomiting Anesthetic complications: no    Last Vitals:  Vitals:   03/03/20 2035 03/03/20 2110  BP: (!) 149/86 (!) 154/88  Pulse: 97 93  Resp: 19 18  Temp:    SpO2: 98% 95%    Last Pain:  Vitals:   03/03/20 2110  TempSrc:   PainSc: 0-No pain                 Catalina Gravel

## 2020-03-03 NOTE — Anesthesia Procedure Notes (Signed)
Procedure Name: MAC Date/Time: 03/03/2020 5:08 PM Performed by: Eligha Bridegroom, CRNA Pre-anesthesia Checklist: Patient identified, Emergency Drugs available, Suction available, Patient being monitored and Timeout performed Patient Re-evaluated:Patient Re-evaluated prior to induction Oxygen Delivery Method: Nasal cannula Preoxygenation: Pre-oxygenation with 100% oxygen Induction Type: IV induction

## 2020-03-03 NOTE — H&P (Signed)
H&P: Podiatry   TSV:[id number] Hauk 59 y.o. male  patient with a history of chronic right foot ulcer; Patient has tried multiple conservative treatments and opted for Surgical management. Patient had pre-op physical and clearance for removal of 2nd metatarsal head and wound debridement with placement of graft completed. Patient met this AM in pre-op holding area; informed consent reviewed and signed. All questions answered. Right foot/surgical site marked.  This am patient denies any other pedal complaints. Denies Nausea/fever/vomiting/chills/night sweats/overnight events. Confirms NPO since midnight.   Patient Active Problem List   Diagnosis Date Noted  . Osteomyelitis of ankle or foot, right, acute ([place]) 02/27/2020  . Clogged ear, left 01/22/2020  . Skin abnormality 01/22/2020  . Alcoholic peripheral neuropathy ([place]) 10/28/2019  . Chronic diastolic CHF (congestive heart failure) ([place]) 10/15/2019  . Chronic foot ulcer ([place]) 10/15/2019  . SIRS (systemic inflammatory response syndrome) ([place]) 10/15/2019  . Lymphedema 05/04/2019  . Alcoholic cirrhosis of liver without ascites ([place]) 04/27/2019  . Obesity hypoventilation syndrome ([place]) 04/27/2019  . Depression 04/25/2019  . Severe alcohol use disorder ([place])   . DOE (dyspnea on exertion) 04/22/2019  . Hypothyroidism 04/22/2019  . Anemia 02/06/2019  . Avascular necrosis of hip, left ([place]) 11/02/2018  . Decreased hearing of both ears 10/30/2018  . Avascular necrosis of hip, right ([place]) 08/16/2018  . Bilateral leg edema 02/27/2018  . Marijuana abuse 02/16/2018  . Ulcer of left heel ([place]) 12/21/2016  . Bilateral carpal tunnel syndrome 12/15/2016  . Fatty liver 12/14/2016  . Hyperlipidemia 11/25/2016  . Elevated liver enzymes 11/25/2016  . Prediabetes 11/25/2016  . Hypertension 11/23/2016  . Toe amputation status, right 11/23/2016  . Morbid obesity ([place]) 11/23/2016  . OSA (obstructive sleep apnea) 11/23/2016  . Other hammer toe (acquired)  11/11/2013  . Exstrophy of bladder 11/11/2013      Allergies  Allergen Reactions  . Claritin [Loratadine] Shortness Of Breath and Anxiety    Social History   Socioeconomic History  . Marital status: Divorced    Spouse name: Not on file  . Number of children: Not on file  . Years of education: Not on file  . Highest education level: Not on file  Occupational History  . Occupation: unemployed, Hydrographic surveyor for disability  Tobacco Use  . Smoking status: Former Smoker    Packs/day: 0.25    Years: 10.00    Pack years: 2.50    Types: Cigarettes  . Smokeless tobacco: Never Used  . Tobacco comment: quit 2018  Substance and Sexual Activity  . Alcohol use: Yes    Comment: 2-3 drinks 4-5 nights a week, liquor  . Drug use: Yes    Frequency: 1.0 times per week    Types: Marijuana    Comment: "4 times a month, maybe"  . Sexual activity: Not on file  Other Topics Concern  . Not on file  Social History Narrative  . Not on file   Social Determinants of Health   Financial Resource Strain:   . Difficulty of Paying Living Expenses:   Food Insecurity:   . Worried About Charity fundraiser in the Last Year:   . Arboriculturist in the Last Year:   Transportation Needs:   . Film/video editor (Medical):   Marland Kitchen Lack of Transportation (Non-Medical):   Physical Activity:   . Days of Exercise per Week:   . Minutes of Exercise per Session:   Stress:   . Feeling of Stress :   Social Connections:   .  Frequency of Communication with Friends and Family:   . Frequency of Social Gatherings with Friends and Family:   . Attends Religious Services:   . Active Member of Clubs or Organizations:   . Attends Archivist Meetings:   Marland Kitchen Marital Status:   Intimate Partner Violence:   . Fear of Current or Ex-Partner:   . Emotionally Abused:   Marland Kitchen Physically Abused:   . Sexually Abused:     Past Surgical History:  Procedure Laterality Date  . AMPUTATION TOE Left 05/25/2018   Procedure:  AMPUTATION TOE left 3rd;  Surgeon: Wylene Simmer, MD;  Location: [facility];  Service: Orthopedics;  Laterality: Left;  53mn, to follow  . AMPUTATION TOE Left 06/28/2018   Procedure: Left foot revision 3rd toe amputation including 3rd metatarsal;  Surgeon: HWylene Simmer MD;  [place] [facility]  Service: [place]opedics;  Laterality: Left;  680m  . BICEPS TENDON REPAIR Left 2014   Partial  . COLONOSCOPY    . HERNIA REPAIR  2016   ventral  . HIP CLOSED REDUCTION Right 09/01/2018   Procedure: CLOSED REDUCTION HIP;  Surgeon: RoNicholes StairsMD;  Location: [facility];  Service: Orthopedics;  Laterality: Right;  . JOINT REPLACEMENT     b/l knees   . TOE AMPUTATION Right 09/2013  . TOTAL HIP ARTHROPLASTY Right 08/16/2018   Procedure: TOTAL HIP ARTHROPLASTY ANTERIOR APPROACH;  Surgeon: SwRod CanMD;  Location: [facility];  Service: Orthopedics;  Laterality: Right;  . TOTAL HIP ARTHROPLASTY Left 11/02/2018   Procedure: TOTAL HIP ARTHROPLASTY ANTERIOR APPROACH;  Surgeon: SwRod CanMD;  Location: [facility];  Service: Orthopedics;  Laterality: Left;  . TOTAL KNEE ARTHROPLASTY     bilat    Family History  Adopted: Yes  Family history unknown: Yes     REVIEW OF SYSTEMS: Noncontributory   PHYSICAL EXAMINATION:  Today's Vitals   02/26/20 1550 03/03/20 0824  BP:  (!) 150/92  Pulse:  95  Resp:  20  Temp:  (!) 97.5 F (36.4 C)  TempSrc:  Tympanic  SpO2:  98%  Weight: 131.5 kg 130.7 kg  Height: _0  (1.854 m) _1  (1.854 m)  PainSc:  0-No pain    GENERAL: Well-developed, well-nourished, in no acute distress. Alert and cooperative.  LOWER EXTREMITY EXAM: Dressing today clean dry and intact to right foot  Previous exam findings  Dermatology: Skin is warm and dry bilateral with a full thickness ulceration present sub met 2 on right. Ulceration measures 3 cm x 3cm x 0.4cm. There is a keratotic border with a granular base. The ulceration does not   probe to bone. There is no malodor, no active drainage, + erythema, + edema. Ulceration plantar left midfoot measures less than 1cm with no acute signs of infection.    Vascular: Dorsalis Pedis pulse = 1/4 Bilateral,  Posterior Tibial pulse = 0/4 Bilateral difficult to palpate due to edema.  Capillary Fill Time < 5 seconds  Neurologic: Protective sensation absent bilateral.  Musculosketal: There is no pain with palpation to ulcerated areas. No pain with compression to calves bilateral. +Pes planus/charcot deformity, S/p 5 toe amputations 2016.  MRI in chart    ASSESSMENTS:  1.Right foot ulcer  2. Abscess and osteomyelitis right foot 2nd met head 3. Diabetic neuropathy 4. Charcot foot   PLAN OF CARE: Patient seen and evaluated 1. History and physical completed 2. Patient NPO since midnight 3. Previous Imaging reviewed  4. Consent for surgery explained  and obtained for right foot 2nd met head resection, wound debridement and placement of graft, risk and benefits explained; all questions answered and no guarantees granted. 5. Patient to undergo above surgical procedure 6. Case discussed with patient and to meet with significant other represented after the procedure 7. To resume all home meds post-procedure and to give prn pain meds, anti-nausea, and anti-constipation medications post op 8. Will continue to follow closely/ see post-operative in the office within 1 week.  Landis Martins, DPM

## 2020-03-03 NOTE — Op Note (Signed)
DATE: 03-03-20  SURGEON: Landis Martins, DPM  PREOPERATIVE DIAGNOSIS:  Right foot ulcer, history of abscess, history of osteomyelitis, prominent second metatarsal head, Charcot foot deformity  POSTOPERATIVE DIAGNOSIS: Same  PROCEDURE PERFORMED: Right foot incision and drainage with irrigation, second met head resection, placement of graft.  HEMOSTASIS:  Right ankle tourniquet  ESTIMATED BLOOD LOSS:  Minimal  ANESTHESIA:  MAC with local 20cc of 1:1 mixture 1% lidocaine and 0.5% marcaine plain  SPECIMENS:  Bone right foot sent to pathology  COMPLICATIONS:  None.  INDICATIONS FOR PROCEDURE:  This patient is a pleasant 59 y.o. Diabetic male who was referred to my office for surgical consult for abscess with bone infection as noted on MRI that was ordered by wound center.  Risks and complications include but are not limited to infection, recurrence of symptoms, pain, numbness, wound dehiscence, failure of graft, delayed healing, as well as need for future surgery/amputation.  No guarantees were given or applied.  All questions were answered to the patient's satisfaction, and the patient has consented to the above procedure.  All preoperative labs and H&P, medical clearances have been obtained and NPO status past midnight has been confirmed.  PROCEDURE IN DETAIL:  Under mild sedation the patient was brought into the operating room and placed on operating table in supine position.  A well-padded right pneumatic ankle tourniquet was placed about the patient's right ankle.  After adequate anesthesia and a local block consisting of 1:1 mixture of 1% lidocaine plain and 0.5% Marcaine plain in a local field block fashion the right foot was then scrubbed prepped and draped in the usual aseptic manner.  The right limb was then elevated and the tourniquet was inflated attention was directed to the right foot where there is of note a plantar ulceration that measures approximately 3 x 3 cm with a granular base  and exposed capsule of the second metatarsophalangeal joint.  Attention was redirected to the dorsal aspect of the foot where a linear incision was placed over the second metatarsal phalangeal joint care was taken to protect and retract all vital neurovascular structures once down to the level of the joint capsule from the dorsal aspect the capsule was then further reflected to reveal the second metatarsal head at his operative site then utilizing a sagittal saw the metatarsal head was transected to the level of the surgical neck and removed and sent to pathology for culture.  The second metatarsophalangeal joint was irrigated utilizing saline pulse lavage and any nonviable tissue was removed using a combination of sharp and blunt dissection of tenotomy scissor as well as 15 blade.  The extensor tendon to the second toe was noted to be viable without any signs of acute infection.  The wound was then reapproximated dorsally utilizing layered fashion technique of 2-0 Vicryl and 3-0 nylon.  Attention was then redirected to the plantar aspect of the right foot where the above-mentioned plantar ulceration was debrided removing all devitalized and nonviable tissue through the level of the deeper fatty tissue layers to the level of capsule revealing healthy bleeding margins after the wound was debrided the wound measured 4x 2.4x0.6cm.  Then utilizing Osiris Stravix graft was applied to the plantar aspect of the right foot and sutured in place utilizing 2-0 Vicryl sutures and staples after the graft was secured a 15 blade was used to stippled the graft to allow for any bleeding to flow through the graft to prevent hematoma or seroma.  The area then was dressed with [facility]  gauze dorsally and Adaptic and Steri-Strips plantarly as well as sterile compressive dressing consisting of 4 x 4's and Kling and [place] and ace wrap.  The pneumatic ankle tourniquet was released.  A prompt hyperemic response was noted to all  digits of the right foot following a period of postoperative monitoring.  The patient tolerated the procedure well.  The patient was transferred from the OR to the recovery room.  Vital signs were stable, and neurovascular status was intact to all digits of the right foot. The patient to be discharged home.   Post-operative prescriptions and instructions were written and given to the patient who will return to the office of Dr. Cannon Kettle in 1 week for continued post op care and management of this patient.  Landis Martins, DPM

## 2020-03-03 NOTE — Anesthesia Preprocedure Evaluation (Addendum)
Anesthesia Evaluation  Patient identified by MRN, date of birth, ID band Patient awake    Reviewed: Allergy & Precautions, NPO status , Patient's Chart, lab work & pertinent test results  History of Anesthesia Complications Negative for: history of anesthetic complications  Airway Mallampati: III  TM Distance: >3 FB Neck ROM: Full    Dental  (+) Chipped, Dental Advisory Given,    Pulmonary sleep apnea , former smoker,  Does not tolerate CPAP, has not used in 2-3years- now using oxygen at night via [place] 2LPM Former light smoker- no inhalers   Pulmonary exam normal breath sounds clear to auscultation       Cardiovascular hypertension, +CHF and + DOE  Normal cardiovascular exam Rhythm:Regular Rate:Normal  Last echo 04/2019: 1. The left ventricle has normal systolic function with an ejection  fraction of 60-65%. The cavity size was mildly dilated. Left ventricular  diastolic parameters were normal. No evidence of left ventricular regional  wall motion abnormalities.  2. The right ventricle has normal systolic function. The cavity was  normal. There is no increase in right ventricular wall thickness.  3. Left atrial size was not well visualized.  4. The aortic valve is tricuspid. Moderate sclerosis of the aortic valve.  Was taken off BP meds by PCP about a month ago because BP was ok in office- HTN to 150/92 in preop   Neuro/Psych PSYCHIATRIC DISORDERS Depression Peripheral neuropathy- B/L feet, some fingers     GI/Hepatic GERD  Controlled,(+)     substance abuse  alcohol use and marijuana use, Hx diverticulitis s/p parital colectomy    Endo/Other  diabetes, Well Controlled, Type 2, Oral Hypoglycemic AgentsHypothyroidism Last a1c 5.7 Obesity BMI 38  Renal/GU CRFRenal disease  negative genitourinary   Musculoskeletal  (+) Arthritis , Osteoarthritis,  Abscess right foot, osteomyelitis    Abdominal (+) + obese,    Peds  Hematology  (+) anemia , HLD   Anesthesia Other Findings   Reproductive/Obstetrics negative OB ROS                            Anesthesia Physical Anesthesia Plan  ASA: III  Anesthesia Plan: MAC   Post-op Pain Management:    Induction: Intravenous  PONV Risk Score and Plan: 2 and Propofol infusion and TIVA  Airway Management Planned: Natural Airway and Simple Face Mask  Additional Equipment: None  Intra-op Plan:   Post-operative Plan:   Informed Consent: I have reviewed the patients History and Physical, chart, labs and discussed the procedure including the risks, benefits and alternatives for the proposed anesthesia with the patient or authorized representative who has indicated his/her understanding and acceptance.     Dental advisory given  Plan Discussed with: CRNA  Anesthesia Plan Comments: (MAC with local by surgeon)       Anesthesia Quick Evaluation

## 2020-03-03 NOTE — Interval H&P Note (Signed)
Anesthesia H&P Update: History and Physical Exam reviewed; patient is OK for planned anesthetic and procedure. ? ?

## 2020-03-03 NOTE — Transfer of Care (Signed)
Immediate Anesthesia Transfer of Care Note  Patient: [name]  Procedure(s) Performed: METATARSAL HEAD EXCISION SECOND TOE RIGHT (Right Toe) IRRIGATION AND DEBRIDEMENT WOUND (Right Foot) GRAFT APPLICATION (Right Foot)  Patient Location: PACU  Anesthesia Type:MAC  Level of Consciousness: awake, alert , oriented and patient cooperative  Airway & Oxygen Therapy: Patient Spontanous Breathing and Patient connected to face mask oxygen  Post-op Assessment: Report given to RN and Post -op Vital signs reviewed and stable  Post vital signs: Reviewed and stable  Last Vitals:  Vitals Value Taken Time  BP 153/87 03/03/20 1009  Temp    Pulse 88 03/03/20 1013  Resp 21 03/03/20 1013  SpO2 100 % 03/03/20 1013  Vitals shown include unvalidated device data.  Last Pain:  Vitals:   03/03/20 0824  TempSrc: Tympanic  PainSc: 0-No pain         Complications: No apparent anesthesia complications

## 2020-03-03 NOTE — H&P (Signed)
[name] QPY:[id number] DOB: 07-12-1961 DOA: 03/03/2020     PCP: [name], MD   Outpatient Specialists:   [facility] Dr. Marylene Land   Patient coming from: home Lives  With SO    Chief Complaint:   peri operative bleeding HPI: [name] is a 59 y.o. male with medical history significant of prediabetes, osteomyelitis of Right metatarsal, HTN, HLD, OSA, diastolic CHF, diabetic neuropathy    Patient with osteomyelitis off his grade 2 on the right foot has been taking Augmentin for this as an outpatient.  Today he has undergone resection at the day clinic with Dr. Marylene Land.  There was noted to have significant bleeding there are after and he was brought into [facility] center.  Patient continued to have significant bleeding from the dressing and he was taken back to the OR where should he undergone cauterization.  At that time the bleeding have stopped. Repeat CBC showed that his hemoglobin went down from   13.7 to around 10.9 Of note patient has lost probably around 300 mL of blood for procedure but he also has received 3 L of IV fluids or so And stopped taking his Lasix a day before procedure. He denies any lower extremity swelling No shortness of breath Denies fever, chills, SOB  Reports have not drank in a few months  COVID 02/28/2020 neg Plan to follow up in the office with podiatry next Tuesday Patient has been seen by ID as an outpatient recommended no antibiotics.  We have operative intervention been definitive treatment   Infectious risk factors:  Reports none Has   been vaccinated against COVID 2nd was 10 days ago    in house  PCR testing  Pending  Lab Results  Component Value Date   SARSCOV2NAA NEGATIVE 02/28/2020   SARSCOV2NAA NEGATIVE 10/15/2019   SARSCOV2NAA NEGATIVE 04/25/2019   Regarding pertinent Chronic problems:     Hyperlipidemia -  Not on statins   HTN on not anymore on Norvasc or metoprolol   chronic CHF diastolic  - last echo 2020   ejection  fraction of 60-65% On lasix BID    pre DM 2 -  Lab Results  Component Value Date   HGBA1C 5.7 02/25/2020  diet controlled  Hypothyroidism:  Lab Results  Component Value Date   TSH 2.26 02/25/2020   on synthroid   Morbid obesity-   BMI Readings from Last 1 Encounters:  03/03/20 38.02 kg/m       OSA -on nocturnal oxygen, 2L     Hospitalist was called for admission for excessive bleeding during procedure  The following Work up has been ordered so far:  Orders Placed This Encounter  Procedures  . Hemoglobin and hematocrit, blood  . Glucose, capillary  . CBC with Differential/Platelet  . Comprehensive metabolic panel  . Protime-INR  . Diet NPO time specified  . SCD's  . Informed Consent Details: Physician/Practitioner Attestation; Transcribe to consent form and obtain patient signature  . Initiate Pre-op Protocol  . 6 CHG cloth bath night before surgery  . 6 CHG cloth bath AM of surgery  . Pre-admission testing diagnosis  . Labs per anesthesia  . Vital signs  . Pre-admission testing diagnosis  . Take all oral medications on usual schedule by mouth with sip of water, with the following exceptions:  Anticoagulants, Diuretics, ACE inhibitors, angiotensin receptor blockers, Herbal medications/supplements, NSAIDS, MAO inhibitors  . Discharge instructions  . Void  . May use Lidocaine 1% - 2% with  or without bicarb 8.4 % subcutaneously prior to IV start if patient not allergic to Lidocaine  . Apply ice to affected area  . Elevate extremity  . Apply cam walker  . Maintain cam walker  . Discharge  per PACU criteria  . Vital signs  . Cardiac monitoring  . Notify physician / Nurse Anesthetist  . May continue current IVF if bag empty and / or patient is nauseated and no further IV orders are written  . Consult to anesthesiology Reason for Consult? Right foot surgery  . Continuous pulse oximetry  . Oxygen therapy Mode or (Route): Nasal cannula; Keep 02  saturation: > 92%  . Pulse oximetry, continuous  . Oxygen therapy Mode or (Route): Nasal cannula; FiO2: OTHER SEE COMMENTS; Liters Per Minute: 2; Keep 02 saturation: greater than or equal to 92 %  . I-STAT, chem 8  . Insert and maintain IV line  . Admit to Inpatient (patient's expected length of stay will be greater than 2 midnights or inpatient only procedure)  . Transfer patient  . Discharge patient Discharge disposition: 01-Home or Self Care; Discharge patient date: 03/03/2020     Following Medications were ordered in ER: Medications  Chlorhexidine Gluconate Cloth 2 % PADS 6 each (has no administration in time range)    And  Chlorhexidine Gluconate Cloth 2 % PADS 6 each (has no administration in time range)  lactated ringers infusion ( Intravenous Anesthesia Volume Adjustment 03/03/20 0949)  oxyCODONE (Oxy IR/ROXICODONE) immediate release tablet 5 mg (has no administration in time range)    Or  oxyCODONE (ROXICODONE) 5 MG/5ML solution 5 mg (has no administration in time range)  HYDROmorphone (DILAUDID) injection 0.25-0.5 mg (has no administration in time range)  ondansetron (ZOFRAN) injection 4 mg (has no administration in time range)  ceFAZolin (ANCEF) 3 g in dextrose 5 % 50 mL IVPB (3 g Intravenous New Bag/Given 03/03/20 0855)  acetaminophen (TYLENOL) tablet 1,000 mg (1,000 mg Oral Given 03/03/20 0827)  ondansetron (ZOFRAN) injection 4 mg (4 mg Intravenous Given 03/03/20 1123)        Significant initial  Findings: Abnormal Labs Reviewed  GLUCOSE, CAPILLARY - Abnormal; Notable for the following components:      Result Value   Glucose-Capillary 131 (*)    All other components within normal limits  POCT I-STAT, CHEM 8 - Abnormal; Notable for the following components:   Creatinine, Ser 0.60 (*)    Glucose, Bld 141 (*)    Hemoglobin 10.9 (*)    HCT 32.0 (*)    All other components within normal limits     Otherwise labs showing:    Recent Labs  Lab 03/03/20 1721  NA 140  K  3.6  GLUCOSE 141*  BUN 8  CREATININE 0.60*    Cr  stable,  Up from baseline see below Lab Results  Component Value Date   CREATININE 0.60 (L) 03/03/2020   CREATININE 0.65 02/25/2020   CREATININE 0.76 10/17/2019    No results for input(s): AST, ALT, ALKPHOS, BILITOT, PROT, ALBUMIN in the last 168 hours. Lab Results  Component Value Date   CALCIUM 9.1 02/25/2020    WBC      Component Value Date/Time   WBC 5.7 02/25/2020 1116   ANC    Component Value Date/Time   NEUTROABS 3.2 02/25/2020 1116   ALC No components found for: LYMPHAB    Plt: Lab Results  Component Value Date   PLT 217.0 02/25/2020       HG/HCT  Down from baseline see below    Component Value Date/Time   HGB 10.9 (L) 03/03/2020 1721   HCT 32.0 (L) 03/03/2020 1721   HCT 31.0 (L) 04/26/2019 1007     ECG: not Ordered   DM  labs:  HbA1C: Recent Labs    04/23/19 1048 10/17/19 0529 02/25/20 1116  HGBA1C 5.0 5.3 5.7     CBG (last 3)  Recent Labs    03/03/20 1822  GLUCAP 131*       ED Triage Vitals  Enc Vitals Group     BP 03/03/20 0824 (!) 150/92     Pulse Rate 03/03/20 0824 95     Resp 03/03/20 0824 20     Temp 03/03/20 0824 (!) 97.5 F (36.4 C)     Temp Source 03/03/20 0824 Tympanic     SpO2 03/03/20 0824 98 %     Weight 02/26/20 1550 290 lb (131.5 kg)     Height 02/26/20 1550 6\' 1"  (1.854 m)     Head Circumference --      Peak Flow --      Pain Score 03/03/20 0824 0     Pain Loc --      Pain Edu? --      Excl. in GC? --   TMAX(24)@       Latest  Blood pressure (!) 155/91, pulse 84, temperature (!) 97 F (36.1 C), resp. rate 19, height 6\' 1"  (1.854 m), weight 130.7 kg, SpO2 95 %.    Review of Systems:    Pertinent positives include: tired  Constitutional:  No weight loss, night sweats, Fevers, chills, fatigue, weight loss  HEENT:  No headaches, Difficulty swallowing,Tooth/dental problems,Sore throat,  No sneezing, itching, ear ache, nasal congestion, post nasal  drip,  Cardio-vascular:  No chest pain, Orthopnea, PND, anasarca, dizziness, palpitations.no Bilateral lower extremity swelling  GI:  No heartburn, indigestion, abdominal pain, nausea, vomiting, diarrhea, change in bowel habits, loss of appetite, melena, blood in stool, hematemesis Resp:  no shortness of breath at rest. No dyspnea on exertion, No excess mucus, no productive cough, No non-productive cough, No coughing up of blood.No change in color of mucus.No wheezing. Skin:  no rash or lesions. No jaundice GU:  no dysuria, change in color of urine, no urgency or frequency. No straining to urinate.  No flank pain.  Musculoskeletal:  No joint pain or no joint swelling. No decreased range of motion. No back pain.  Psych:  No change in mood or affect. No depression or anxiety. No memory loss.  Neuro: no localizing neurological complaints, no tingling, no weakness, no double vision, no gait abnormality, no slurred speech, no confusion  All systems reviewed and apart from HOPI all are negative  Past Medical History:   Past Medical History:  Diagnosis Date  . Alcohol dependence (HCC)   . Arthritis    hips, hands  . Bilateral carpal tunnel syndrome   . Bilateral leg edema    Chronic  . Diabetes mellitus without complication (HCC)    type 2  . Diverticulitis    portion of colon removed  . DOE (dyspnea on exertion)    occ  . Elevated liver enzymes   . Fatty liver   . GERD (gastroesophageal reflux disease)    occ  . Hammer toe   . Hip pain   . History of ventral hernia repair 2016   x2  . Hyperlipidemia    pt unsure  . Hypertension   . Marijuana  abuse   . Morbid obesity (HCC)   . Neuromuscular disorder (HCC)    peripheral neuropathy feet and few fingers  . OSA (obstructive sleep apnea)    has OSA-not used CPAP 2-3 yrs could not tolerate cpap  . PONV (postoperative nausea and vomiting)   . Toe ulcer (HCC)    left healed      Past Surgical History:  Procedure Laterality  Date  . AMPUTATION TOE Left 05/25/2018   Procedure: AMPUTATION TOE left 3rd;  Surgeon: Toni Arthurs, MD;  Location: [facility];  Service: Orthopedics;  Laterality: Left;  , to follow  . AMPUTATION TOE Left 06/28/2018   Procedure: Left foot revision 3rd toe amputation including 3rd metatarsal;  Surgeon: Toni Arthurs, MD;  Location: [facility];  Service: Orthopedics;  Laterality: Left;   . BICEPS TENDON REPAIR Left 2014   Partial  . COLONOSCOPY    . HERNIA REPAIR  2016   ventral  . HIP CLOSED REDUCTION Right 09/01/2018   Procedure: CLOSED REDUCTION HIP;  Surgeon: Yolonda Kida, MD;  Location: [facility];  Service: Orthopedics;  Laterality: Right;  . JOINT REPLACEMENT     b/l knees   . TOE AMPUTATION Right 09/2013  . TOTAL HIP ARTHROPLASTY Right 08/16/2018   Procedure: TOTAL HIP ARTHROPLASTY ANTERIOR APPROACH;  Surgeon: Samson Frederic, MD;  Location: [facility];  Service: Orthopedics;  Laterality: Right;  . TOTAL HIP ARTHROPLASTY Left 11/02/2018   Procedure: TOTAL HIP ARTHROPLASTY ANTERIOR APPROACH;  Surgeon: Samson Frederic, MD;  Location: [facility];  Service: Orthopedics;  Laterality: Left;  . TOTAL KNEE ARTHROPLASTY     bilat    Social History:  Ambulatory  cane,      reports that he has quit smoking. His smoking use included cigarettes. He has a 2.50 pack-year smoking history. He has never used smokeless tobacco. He reports current alcohol use. He reports current drug use. Frequency: 1.00 time per week. Drug: Marijuana.   Family History:  adopted Family History  Adopted: Yes  Family history unknown: Yes    Allergies: Allergies  Allergen Reactions  . Claritin [Loratadine] Shortness Of Breath and Anxiety     Prior to Admission medications   Medication Sig Start Date End Date Taking? Authorizing Provider  amLODipine (NORVASC) 5 MG tablet amlodipine 5 mg tablet  TAKE 1 TABLET BY MOUTH EVERY DAY   Yes [provider]   amoxicillin-clavulanate (AUGMENTIN) 875-125 MG tablet Take 1 tablet by mouth 2 (two) times daily. 02/13/20  Yes [provider]  diphenhydrAMINE (BENADRYL ALLERGY) 25 MG tablet Take 25 mg by mouth at bedtime as needed for allergies.    Yes [provider]  DULoxetine (CYMBALTA) 20 MG capsule Take 1 capsule (20 mg total) by mouth daily. 10/29/19  Yes Burns, Bobette Mo, MD  furosemide (LASIX) 40 MG tablet TAKE 1 TABLET (40 MG TOTAL) BY MOUTH 2 (TWO) TIMES DAILY. 10/29/19  Yes Burns, Bobette Mo, MD  KLOR-CON M20 20 MEQ tablet TAKE 2 TABLETS (40 MEQ TOTAL) BY MOUTH 3 (THREE) TIMES DAILY. Patient taking differently: 3 (three) times daily.  10/26/19  Yes Burns, Bobette Mo, MD  levothyroxine (SYNTHROID) 50 MCG tablet Take 1 tablet (50 mcg total) by mouth daily. Need office visit for more refills. 01/03/20  Yes Burns, Bobette Mo, MD  saccharomyces boulardii (FLORASTOR) 250 MG capsule Digest Probiotic (S.boulardii) 250 mg capsule  TAKE 1 CAPSULE (250 MG TOTAL) BY MOUTH 2 (TWO) TIMES DAILY FOR 14 DAYS.  Yes [provider]  traZODone (DESYREL) 50 MG tablet TAKE 1-2 TABLETS (50-100 MG TOTAL) BY MOUTH AT BEDTIME AS NEEDED FOR SLEEP. 12/31/19  Yes Burns, Claudina Lick, MD  TURMERIC PO Take by mouth.   Yes [provider]  calcium carbonate (OS-CAL) 1250 (500 Ca) MG chewable tablet calcium carbonate 200 mg calcium (500 mg) chewable tablet   2  s by oral route.    [provider]  docusate sodium (COLACE) 100 MG capsule Take 1 capsule (100 mg total) by mouth daily as needed. 03/03/20 03/03/21  Landis Martins, DPM  HYDROcodone-acetaminophen (NORCO) 10-325 MG tablet Take 1 tablet by mouth every 6 (six) hours as needed for up to 7 days. 03/03/20 03/10/20  Landis Martins, DPM  promethazine (PHENERGAN) 50 MG tablet Take 1 tablet (50 mg total) by mouth every 6 (six) hours as needed for nausea or vomiting. 03/03/20   Stover, Titorya, DPM  REGRANEX 0.01 % gel APPLY NICKEL THICK AMOUNT TO WOUND BED AND  THEN COVER WITH A DRESSING AS DIRECTED IN CLINIC DAILY 12/13/19   [provider]  thiamine 100 MG tablet TAKE 1 TABLET BY MOUTH EVERY DAY 01/21/20   Binnie Rail, MD   Physical Exam: Blood pressure (!) 155/91, pulse 84, temperature (!) 97 F (36.1 C), resp. rate 19, height 6\' 1"  (1.854 m), weight 130.7 kg, SpO2 95 %. 1. General:  in No Acute distress    Chronically ill -appearing 2. Psychological: Alert and Oriented 3. Head/ENT:    Dry Mucous Membranes                          Head Non traumatic, neck supple                          Normal Dentition 4. SKIN: decreased Skin turgor,  Skin clean Dry and intact no rash 5. Heart: Regular rate and rhythm no Murmur, no Rub or gallop 6. Lungs:  no wheezes or crackles   7. Abdomen: Soft, non-tender, Non distended, periumbilical hernia  Obese bowel sounds present 8. Lower extremities: no clubbing, cyanosis, no edema right foot in dressings 9. Neurologically Grossly intact, moving all 4 extremities equally   10. MSK: Normal range of motion   All other LABS:     Recent Labs  Lab 03/03/20 1721  HGB 10.9*  HCT 32.0*     Recent Labs  Lab 03/03/20 1721  NA 140  K 3.6  CL 100  GLUCOSE 141*  BUN 8  CREATININE 0.60*     No results for input(s): AST, ALT, ALKPHOS, BILITOT, PROT, ALBUMIN in the last 168 hours.     Cultures:    Component Value Date/Time   SDES  12/12/2019 1635    WOUND LEFT PLANTAR FOOT Performed at [facility], [place] [address], [place], [place] [address]    SPECREQUEST  12/12/2019 1635    NONE Performed at [facility], [place] [address], [place], [place] [address]    CULT  12/12/2019 1635    RARE NORMAL SKIN FLORA Performed at [facility], [place] [address], [place], [place] [address]    REPTSTATUS 12/15/2019 FINAL 12/12/2019 1635     Radiological Exams on Admission: No results found.  Chart has been reviewed  Assessment/Plan   59 y.o. male with medical  history significant of prediabetes, osteomyelitis of Right metatarsal, HTN, HLD, OSA, diastolic CHF, diabetic  neuropathy     Admitted for excessive bleeding perioperatively  Present on Admission: . Acute postoperative anemia due to greater than expected blood loss -  CBC has stabilized continue to follow postop.  Resume Lasix when stable. Check INR as patient has history of alcohol abuse and cirrhosis  . Bleeding -improved after cauterization continue to monitor  . Hypertension -chronic stable has not been taking any of his medications recently as he has been taking off on them his blood pressure poorly improved  . Osteomyelitis due to type 2 diabetes mellitus (HCC) -undergone resection by podiatry currently stable continue treatment per them  . Morbid obesity (HCC) chronic stable continue to follow-up with primary care provider as an outpatient   . OSA (obstructive sleep apnea) on nocturnal oxygen will continue  . Hyperlipidemia no longer taking any medications  . Prediabetes hemoglobin A1c 5.7 monitor blood sugars diabetic diet  . Anemia monitor at this point no indication for transfusion   . Hypothyroidism - - Check TSH continue home medications at current dose  . Alcoholic cirrhosis of liver without ascites (HCC) -LFTs within normal limits check INR patient no longer drinks  . Obesity hypoventilation syndrome (HCC) -improving with weight loss   . Chronic diastolic CHF (congestive heart failure) (HCC) -stable monitor for any evidence of fluid overload resume your Lasix    Other plan as per orders.  DVT prophylaxis:  SCD   Code Status:  FULL CODE   as per patient   I had personally discussed CODE STATUS with patient   Family Communication:   Family not at  Bedside    Disposition Plan:     To home once workup is complete and patient is stable   Following barriers for discharge:                            Hg remain stable                             Pain controlled  with PO medications                                                        Will need consultants to evaluate patient prior to discharge                     Consults called: Podiatry    Admission status:  ED Disposition    None      Status is:obs     Obs     Level of care  medical floor    Precautions: admitted as  asymptomatic screening protocol    PPE: Used by the provider:   P100  eye Goggles,  Gloves    Deseray Daponte 03/04/2020, 12:10 AM    [facility]     after 2 AM please page floor coverage PA If 7AM-7PM, please contact the day team taking care of the patient using [email or web address]   Patient was evaluated in the context of the global COVID-19 pandemic, which necessitated consideration that the patient might be at risk for infection with the SARS-CoV-2 virus that causes COVID-19. Institutional protocols and algorithms that pertain to the evaluation of patients at risk for COVID-19 are in a  state of rapid change based on information released by regulatory bodies including the CDC and federal and state organizations. These policies and algorithms were followed during the patient's care.

## 2020-03-03 NOTE — Telephone Encounter (Signed)
[name], CMA states surgery center says pt is continuing to bleed and they are unable to contact Dr. Marylene Land. I spoke with [facility] and she states Dr. Marylene Land has returned to the day center.

## 2020-03-03 NOTE — Anesthesia Postprocedure Evaluation (Signed)
Anesthesia Post Note  Patient: [name]  Procedure(s) Performed: METATARSAL HEAD EXCISION SECOND TOE RIGHT (Right Toe) IRRIGATION AND DEBRIDEMENT WOUND (Right Foot) GRAFT APPLICATION (Right Foot)     Patient location during evaluation: PACU Anesthesia Type: MAC Level of consciousness: awake and alert Pain management: pain level controlled Vital Signs Assessment: post-procedure vital signs reviewed and stable Respiratory status: spontaneous breathing, nonlabored ventilation and respiratory function stable Cardiovascular status: blood pressure returned to baseline and stable Postop Assessment: no apparent nausea or vomiting Anesthetic complications: no    Last Vitals:  Vitals:   03/03/20 1009 03/03/20 1015  BP: (!) 153/87 (!) 143/76  Pulse: 93 91  Resp: 17 17  Temp:  (!) (P) 35.9 C  SpO2: 100% 100%    Last Pain:  Vitals:   03/03/20 0824  TempSrc: Tympanic  PainSc: 0-No pain                 Pervis Hocking

## 2020-03-03 NOTE — Transfer of Care (Signed)
Immediate Anesthesia Transfer of Care Note  Patient: [name]  Procedure(s) Performed: IRRIGATION OF TOE AND CAUTERIZATION OF BLEEDING TOE (Right Toe)  Patient Location: PACU  Anesthesia Type:MAC  Level of Consciousness: awake, alert  and oriented  Airway & Oxygen Therapy: Patient Spontanous Breathing and Patient connected to nasal cannula oxygen  Post-op Assessment: Report given to RN and Post -op Vital signs reviewed and stable  Post vital signs: Reviewed and stable  Last Vitals:  Vitals Value Taken Time  BP 136/68 03/03/20 1825  Temp 36.1 C 03/03/20 1825  Pulse 92 03/03/20 1827  Resp 23 03/03/20 1827  SpO2 97 % 03/03/20 1827  Vitals shown include unvalidated device data.  Last Pain:  Vitals:   03/03/20 1615  TempSrc:   PainSc: 0-No pain         Complications: No apparent anesthesia complications

## 2020-03-04 DIAGNOSIS — E1169 Type 2 diabetes mellitus with other specified complication: Secondary | ICD-10-CM | POA: Diagnosis not present

## 2020-03-04 DIAGNOSIS — D649 Anemia, unspecified: Secondary | ICD-10-CM | POA: Diagnosis not present

## 2020-03-04 DIAGNOSIS — M86171 Other acute osteomyelitis, right ankle and foot: Secondary | ICD-10-CM | POA: Diagnosis not present

## 2020-03-04 DIAGNOSIS — R58 Hemorrhage, not elsewhere classified: Secondary | ICD-10-CM

## 2020-03-04 DIAGNOSIS — M96830 Postprocedural hemorrhage and hematoma of a musculoskeletal structure following a musculoskeletal system procedure: Secondary | ICD-10-CM | POA: Diagnosis not present

## 2020-03-04 DIAGNOSIS — D62 Acute posthemorrhagic anemia: Secondary | ICD-10-CM | POA: Diagnosis not present

## 2020-03-04 DIAGNOSIS — K7030 Alcoholic cirrhosis of liver without ascites: Secondary | ICD-10-CM | POA: Diagnosis not present

## 2020-03-04 DIAGNOSIS — S98131A Complete traumatic amputation of one right lesser toe, initial encounter: Secondary | ICD-10-CM | POA: Diagnosis not present

## 2020-03-04 DIAGNOSIS — Z20822 Contact with and (suspected) exposure to covid-19: Secondary | ICD-10-CM | POA: Diagnosis not present

## 2020-03-04 LAB — CBC WITH DIFFERENTIAL/PLATELET
Abs Immature Granulocytes: 0.02 10*3/uL (ref 0.00–0.07)
Basophils Absolute: 0 10*3/uL (ref 0.0–0.1)
Basophils Relative: 1 %
Eosinophils Absolute: 0 10*3/uL (ref 0.0–0.5)
Eosinophils Relative: 1 %
HCT: 27.3 % — ABNORMAL LOW (ref 39.0–52.0)
Hemoglobin: 9.1 g/dL — ABNORMAL LOW (ref 13.0–17.0)
Immature Granulocytes: 1 %
Lymphocytes Relative: 25 %
Lymphs Abs: 1 10*3/uL (ref 0.7–4.0)
MCH: 33 pg (ref 26.0–34.0)
MCHC: 33.3 g/dL (ref 30.0–36.0)
MCV: 98.9 fL (ref 80.0–100.0)
Monocytes Absolute: 0.4 10*3/uL (ref 0.1–1.0)
Monocytes Relative: 11 %
Neutro Abs: 2.4 10*3/uL (ref 1.7–7.7)
Neutrophils Relative %: 61 %
Platelets: 123 10*3/uL — ABNORMAL LOW (ref 150–400)
RBC: 2.76 MIL/uL — ABNORMAL LOW (ref 4.22–5.81)
RDW: 14.4 % (ref 11.5–15.5)
WBC: 3.9 10*3/uL — ABNORMAL LOW (ref 4.0–10.5)
nRBC: 0 % (ref 0.0–0.2)

## 2020-03-04 LAB — GLUCOSE, CAPILLARY
Glucose-Capillary: 140 mg/dL — ABNORMAL HIGH (ref 70–99)
Glucose-Capillary: 140 mg/dL — ABNORMAL HIGH (ref 70–99)

## 2020-03-04 LAB — COMPREHENSIVE METABOLIC PANEL
ALT: 33 U/L (ref 0–44)
AST: 35 U/L (ref 15–41)
Albumin: 2.7 g/dL — ABNORMAL LOW (ref 3.5–5.0)
Alkaline Phosphatase: 114 U/L (ref 38–126)
Anion gap: 8 (ref 5–15)
BUN: 6 mg/dL (ref 6–20)
CO2: 27 mmol/L (ref 22–32)
Calcium: 8.4 mg/dL — ABNORMAL LOW (ref 8.9–10.3)
Chloride: 104 mmol/L (ref 98–111)
Creatinine, Ser: 0.77 mg/dL (ref 0.61–1.24)
GFR calc Af Amer: >60 mL/min (ref >60)
GFR calc non Af Amer: >60 mL/min (ref >60)
Glucose, Bld: 129 mg/dL — ABNORMAL HIGH (ref 70–99)
Potassium: 3.4 mmol/L — ABNORMAL LOW (ref 3.5–5.1)
Sodium: 139 mmol/L (ref 135–145)
Total Bilirubin: 1.7 mg/dL — ABNORMAL HIGH (ref 0.3–1.2)
Total Protein: 6.1 g/dL — ABNORMAL LOW (ref 6.5–8.1)

## 2020-03-04 LAB — PHOSPHORUS: Phosphorus: 3.3 mg/dL (ref 2.5–4.6)

## 2020-03-04 LAB — APTT: aPTT: 32 seconds (ref 24–36)

## 2020-03-04 LAB — TSH: TSH: 2.577 u[IU]/mL (ref 0.350–4.500)

## 2020-03-04 LAB — MAGNESIUM: Magnesium: 1.2 mg/dL — ABNORMAL LOW (ref 1.7–2.4)

## 2020-03-04 MED ORDER — MAGNESIUM SULFATE 4 GM/100ML IV SOLN
4.0000 g | Freq: Once | INTRAVENOUS | Status: AC
  Administered 2020-03-04: 4 g via INTRAVENOUS
  Filled 2020-03-04: qty 100

## 2020-03-04 MED ORDER — POTASSIUM CHLORIDE CRYS ER 20 MEQ PO TBCR
40.0000 meq | EXTENDED_RELEASE_TABLET | ORAL | Status: DC
  Administered 2020-03-04: 40 meq via ORAL
  Filled 2020-03-04: qty 2

## 2020-03-04 MED ORDER — AMOXICILLIN-POT CLAVULANATE 875-125 MG PO TABS
1.0000 | ORAL_TABLET | Freq: Two times a day (BID) | ORAL | 0 refills | Status: DC
Start: 2020-03-04 — End: 2020-03-09

## 2020-03-04 NOTE — Progress Notes (Signed)
Subjective: [name] is a 59 y.o. male patient seen at bedside, resting comfortably in no acute distress s/p day #1 right foot irrigation and cauterization of bleeding vessels right foot and status post right second metatarsal head resection with application of graft secondary to neuropathic ulcer with history of previous osteomyelitis. Patient denies pain at surgical site, denies calf pain, denies headache, chest pain, shortness of breath, nausea, vomitting, denies loss of appetite, denies any overnight problems.  No other issues noted.   Patient Active Problem List   Diagnosis Date Noted  . Amputated toe of right foot ([place]) 03/03/2020  . Bleeding 03/03/2020  . Acute postoperative anemia due to greater than expected blood loss 03/03/2020  . Osteomyelitis of ankle or foot, right, acute ([place]) 02/27/2020  . Clogged ear, left 01/22/2020  . Skin abnormality 01/22/2020  . Alcoholic peripheral neuropathy ([place]) 10/28/2019  . Chronic diastolic CHF (congestive heart failure) ([place]) 10/15/2019  . Chronic foot ulcer ([place]) 10/15/2019  . SIRS (systemic inflammatory response syndrome) ([place]) 10/15/2019  . Lymphedema 05/04/2019  . Alcoholic cirrhosis of liver without ascites ([place]) 04/27/2019  . Obesity hypoventilation syndrome ([place]) 04/27/2019  . Depression 04/25/2019  . Severe alcohol use disorder ([place])   . DOE (dyspnea on exertion) 04/22/2019  . Hypothyroidism 04/22/2019  . Anemia 02/06/2019  . Avascular necrosis of hip, left ([place]) 11/02/2018  . Decreased hearing of both ears 10/30/2018  . Avascular necrosis of hip, right ([place]) 08/16/2018  . Bilateral leg edema 02/27/2018  . Marijuana abuse 02/16/2018  . Ulcer of left heel ([place]) 12/21/2016  . Bilateral carpal tunnel syndrome 12/15/2016  . Fatty liver 12/14/2016  . Hyperlipidemia 11/25/2016  . Elevated liver enzymes 11/25/2016  . Prediabetes 11/25/2016  . Hypertension 11/23/2016  . Osteomyelitis due to type 2 diabetes mellitus ([place]) 11/23/2016   . Morbid obesity ([place]) 11/23/2016  . OSA (obstructive sleep apnea) 11/23/2016  . Other hammer toe (acquired) 11/11/2013  . Exstrophy of bladder 11/11/2013     Current Facility-Administered Medications:  .  0.9 %  sodium chloride infusion, 250 mL, Intravenous, PRN, Doutova, Anastassia, MD .  acetaminophen (TYLENOL) tablet 650 mg, 650 mg, Oral, Q6H PRN **OR** acetaminophen (TYLENOL) suppository 650 mg, 650 mg, Rectal, Q6H PRN, Doutova, Anastassia, MD .  6 CHG cloth bath night before surgery, , , Once **AND** 6 CHG cloth bath AM of surgery, , , Once **AND** Chlorhexidine Gluconate Cloth 2 % PADS 6 each, 6 each, Topical, Once **AND** Chlorhexidine Gluconate Cloth 2 % PADS 6 each, 6 each, Topical, Once, Doutova, Anastassia, MD .  docusate sodium (COLACE) capsule 100 mg, 100 mg, Oral, BID, Doutova, Anastassia, MD .  DULoxetine (CYMBALTA) DR capsule 20 mg, 20 mg, Oral, Daily, Doutova, Anastassia, MD .  furosemide (LASIX) tablet 40 mg, 40 mg, Oral, BID, Doutova, Anastassia, MD, 40 mg at 03/04/20 0546 .  HYDROcodone-acetaminophen (NORCO/VICODIN) 5-325 MG per tablet 1-2 tablet, 1-2 tablet, Oral, Q4H PRN, Doutova, Anastassia, MD .  insulin aspart (novoLOG) injection 0-15 Units, 0-15 Units, Subcutaneous, TID WC, Doutova, Anastassia, MD .  insulin aspart (novoLOG) injection 0-5 Units, 0-5 Units, Subcutaneous, QHS, Doutova, Anastassia, MD .  lactated ringers infusion, , Intravenous, Continuous, Toy Baker, MD, New Bag at 03/03/20 0846 .  levothyroxine (SYNTHROID) tablet 50 mcg, 50 mcg, Oral, Q0600, Toy Baker, MD, 50 mcg at 03/04/20 0546 .  ondansetron (ZOFRAN) tablet 4 mg, 4 mg, Oral, Q6H PRN **OR** ondansetron (ZOFRAN) injection 4 mg, 4 mg, Intravenous, Q6H PRN, Doutova, Anastassia, MD .  sodium chloride  flush (NS) 0.9 % injection 3 mL, 3 mL, Intravenous, Q12H, Doutova, Anastassia, MD, 3 mL at 03/04/20 0550 .  sodium chloride flush (NS) 0.9 % injection 3 mL, 3 mL, Intravenous, Q12H,  Doutova, Anastassia, MD, 3 mL at 03/04/20 0550 .  sodium chloride flush (NS) 0.9 % injection 3 mL, 3 mL, Intravenous, PRN, Doutova, Anastassia, MD .  traZODone (DESYREL) tablet 50-100 mg, 50-100 mg, Oral, QHS PRN, Doutova, Anastassia, MD, 100 mg at 03/03/20 2235  Allergies  Allergen Reactions  . Claritin [Loratadine] Shortness Of Breath and Anxiety     Objective: Today's Vitals   03/03/20 2300 03/04/20 0006 03/04/20 0519 03/04/20 0735  BP:  (!) 147/83 (!) 150/81 (!) 144/78  Pulse:  98 (!) 105 96  Resp:  16 16 17   Temp:  98.1 F (36.7 C) 97.6 F (36.4 C) 97.8 F (36.6 C)  TempSrc:  Oral Oral   SpO2:  98% 98% 98%  Weight:      Height:      PainSc: 0-No pain   0-No pain    General: No acute distress  Right lower extremity: Dressing to right foot clean, dry, intact. No strikethrough noted,No extensive redness or swelling beyond the margins of the dressings. No calf pain. Range of motion excluding surgical site within normal limits with no pain or crepitation.     Assessment and Plan:  Status post day 1 right foot surgery irrigation and cauterization of bleeding vessels  -Patient seen and evaluated at bedside -Dressing check performed -Chart reviewed -Advised patient to make sure to keep dressing clean, dry, and intact to right foot  -Advised patient to continue with limited weightbearing only to the heel for bedside and bathroom only with assistance from staff but to be very careful to not want him up on his foot excessively at this time to prevent against rebleeding -Continue with rest and elevation to assist with pain and edema control  -Per podiatry point of view patient is stable to discharge to home today and will continue with Augmentin antibiotic as well as as needed medications that have been sent to his pharmacy for postoperative pain including Norco, Phenergan, and Colace.  Patient has a follow-up to see me in office on next week and is aware to use his wheelchair to  limit any pressure to the right surgical foot and may wear cam boot to protect the foot with only pressure to the heel for transfer  , DPM

## 2020-03-04 NOTE — Discharge Summary (Signed)
Physician Discharge Summary  Taylor Levick TSV:[id number] DOB: 14-Dec-1960 DOA: 03/03/2020  PCP: [name], MD  Admit date: 03/03/2020 Discharge date: 03/04/2020  Admitted From: Home Disposition: Home  Recommendations for Outpatient Follow-up:  1. Follow up with PCP in 1-2 weeks 2. Please obtain BMP/CBC in one week your next doctors visit.  3. Continue previously prescribed Augmentin, pain medication and bowel regimen per podiatry 4. Follow-up outpatient podiatry at their discretion.   Discharge Condition: Stable CODE STATUS: Full code Diet recommendation: Heart healthy/diabetic  Brief/Interim Summary: 59 y.o. male with medical history significant of prediabetes, osteomyelitis of Right metatarsal, HTN, HLD, OSA, diastolic CHF, diabetic neuropathy diagnosed with osteomyelitis of the right foot outpatient was prescribed Augmentin.  He failed antibiotic therapy therefore admitted to the hospital for significant amount of bleeding.  The area was cauterized, debridement was performed with placement of a graft.  Hemoglobin remained stable thereafter.  Cleared for discharge the following day with outpatient follow-up.  Postop acute blood loss anemia/excessive bleeding from right foot metatarsal osteomyelitis Continue Augmentin and Augmentin per podiatry.  Status post cauterization and debridement of that area on 5/17.  Hemoglobin remained stable at this time.  Resume his home medications. Baseline hemoglobin around 12.  Due to postop blood loss this morning at 79.1 but There was evidence of bleeding anymore.  Hypertension Resume home meds  Obstructive sleep apnea Would recommend outpatient sleep study  Hypothyroidism Resume home regimen  Alcoholic cirrhosis Counseled to quit using this.  Currently appears to be compensated.   Discharge Diagnoses:  Active Problems:   Hypertension   Osteomyelitis due to type 2 diabetes mellitus (HCC)   Morbid obesity (HCC)   OSA (obstructive  sleep apnea)   Hyperlipidemia   Prediabetes   Anemia   Hypothyroidism   Alcoholic cirrhosis of liver without ascites (HCC)   Obesity hypoventilation syndrome (HCC)   Chronic diastolic CHF (congestive heart failure) (HCC)   Amputated toe of right foot (HCC)   Bleeding   Acute postoperative anemia due to greater than expected blood loss    Consultations:  Podiatry  Subjective: Tolerated surgery well.  No obvious evidence of bleeding at this time.  Surgical dressing in place.  Discharge Exam: Vitals:   03/04/20 0519 03/04/20 0735  BP: (!) 150/81 (!) 144/78  Pulse: (!) 105 96  Resp: 16 17  Temp: 97.6 F (36.4 C) 97.8 F (36.6 C)  SpO2: 98% 98%   Vitals:   03/03/20 2223 03/04/20 0006 03/04/20 0519 03/04/20 0735  BP: (!) 156/101 (!) 147/83 (!) 150/81 (!) 144/78  Pulse: (!) 110 98 (!) 105 96  Resp: 18 16 16 17   Temp: 98.2 F (36.8 C) 98.1 F (36.7 C) 97.6 F (36.4 C) 97.8 F (36.6 C)  TempSrc: Oral Oral Oral   SpO2: 99% 98% 98% 98%  Weight:      Height:        General: Pt is alert, awake, not in acute distress Cardiovascular: RRR, S1/S2 +, no rubs, no gallops Respiratory: CTA bilaterally, no wheezing, no rhonchi Abdominal: Soft, NT, ND, bowel sounds + Extremities: no edema, no cyanosis Right foot surgical dressing in place without evidence of bleeding.  Deformity of his left foot.  Discharge Instructions   Allergies as of 03/04/2020      Reactions   Claritin [loratadine] Shortness Of Breath, Anxiety      Medication List    TAKE these medications   amoxicillin-clavulanate 875-125 MG tablet Commonly known as: Augmentin Take 1 tablet by mouth  every 12 (twelve) hours for 10 days.   cholecalciferol 25 MCG (1000 UNIT) tablet Commonly known as: VITAMIN D3 Take 1,000 Units by mouth daily.   docusate sodium 100 MG capsule Commonly known as: Colace Take 1 capsule (100 mg total) by mouth daily as needed.   DULoxetine 20 MG capsule Commonly known as:  CYMBALTA Take 1 capsule (20 mg total) by mouth daily.   furosemide 40 MG tablet Commonly known as: LASIX TAKE 1 TABLET (40 MG TOTAL) BY MOUTH 2 (TWO) TIMES DAILY. What changed:   how much to take  how to take this  when to take this  additional instructions   HYDROcodone-acetaminophen 10-325 MG tablet Commonly known as: Norco Take 1 tablet by mouth every 6 (six) hours as needed for up to 7 days.   Klor-Con M20 20 MEQ tablet Generic drug: potassium chloride SA TAKE 2 TABLETS (40 MEQ TOTAL) BY MOUTH 3 (THREE) TIMES DAILY. What changed: See the new instructions.   levothyroxine 50 MCG tablet Commonly known as: SYNTHROID Take 1 tablet (50 mcg total) by mouth daily. Need office visit for more refills.   promethazine 50 MG tablet Commonly known as: PHENERGAN Take 1 tablet (50 mg total) by mouth every 6 (six) hours as needed for nausea or vomiting.   thiamine 100 MG tablet TAKE 1 TABLET BY MOUTH EVERY DAY   traZODone 50 MG tablet Commonly known as: DESYREL TAKE 1-2 TABLETS (50-100 MG TOTAL) BY MOUTH AT BEDTIME AS NEEDED FOR SLEEP.   TURMERIC PO Take 1 tablet by mouth daily.   vitamin B-12 100 MCG tablet Commonly known as: CYANOCOBALAMIN Take 100 mcg by mouth daily.      Follow-up Information    [name], MD. Schedule an appointment as soon as possible for a visit in 1 week(s).   Specialty: Internal Medicine Contact information: [address] [place] [address] [phone]          Allergies  Allergen Reactions  . Claritin [Loratadine] Shortness Of Breath and Anxiety    You were cared for by a hospitalist during your hospital stay. If you have any questions about your discharge medications or the care you received while you were in the hospital after you are discharged, you can call the unit and asked to speak with the hospitalist on call if the hospitalist that took care of you is not available. Once you are discharged, your primary care  physician will handle any further medical issues. Please note that no refills for any discharge medications will be authorized once you are discharged, as it is imperative that you return to your primary care physician (or establish a relationship with a primary care physician if you do not have one) for your aftercare needs so that they can reassess your need for medications and monitor your lab values.   Procedures/Studies: MR FOOT RIGHT W WO CONTRAST  Result Date: 02/13/2020 CLINICAL DATA:  Plantar foot ulcer. EXAM: MRI OF THE RIGHT FOREFOOT WITHOUT AND WITH CONTRAST TECHNIQUE: Multiplanar, multisequence MR imaging of the right forefoot was performed before and after the administration of intravenous contrast. CONTRAST:  71mL GADAVIST GADOBUTROL 1 MMOL/ML IV SOLN COMPARISON:  Right foot x-rays dated October 15, 2019. MRI right foot dated Feb 18, 2018. FINDINGS: Despite efforts by the technologist and patient, motion artifact is present on today's exam and could not be eliminated. This reduces exam sensitivity and specificity. Bones/Joint/Cartilage Abnormal marrow edema and enhancement with corresponding decreased T1 marrow signal involving the  proximal half of the fourth and fifth metatarsals, as well as the residual third metatarsal, cuboid, lateral cuneiform, and lateral aspect of the navicular. There is new subluxation of the fourth and fifth TMT joints. There is faint marrow edema in the second metatarsal head with relatively preserved T1 marrow signal Prior amputation of the third toe to the level of the mid metatarsal. Chronic fracture deformity of the distal second metatarsal with secondary moderate osteoarthritis at the second MTP joint. Unchanged hallux valgus deformity with severe first MTP joint osteoarthritis. No fracture or dislocation. Chronic juxta-articular erosions of the first metatarsal head again noted. Small first TMT joint effusion. Muscles and Tendons Flexor and extensor tendons are  intact. Mild enhancement of the second flexor tendon sheath. Unchanged severe atrophy of the intrinsic muscles of the forefoot. Soft tissue Plantar ulceration of the mid forefoot at the level of the second metatarsal head. 1.4 x 0.9 x 0.8 cm fluid collection plantar to the second metatarsal head. Prominent intermetatarsal soft tissue enhancement between the second and fourth metatarsals. Diffuse enhancing superficial soft tissue swelling of the forefoot. No soft tissue mass. IMPRESSION: 1. Plantar ulceration of the mid forefoot at the level of the second metatarsal head with underlying 1.4 x 0.9 x 0.8 cm abscess. Diffuse cellulitis of the foot with deep soft tissue infection in between the second and fourth metatarsals at the site of prior third metatarsal amputation. 2. Abnormal marrow signal and enhancement involving the proximal half of the fourth and fifth metatarsals, as well as the residual third metatarsal, cuboid, lateral cuneiform, and lateral aspect of the navicular. While these areas are somewhat removed from the plantar ulceration without discrete draining sinus tract, given the extensive soft tissue infection of the foot, appearance is suspicious for osteomyelitis. There is likely some underlying neuropathic arthropathy as well given new subluxation of the fourth and fifth TMT joints. 3. Unchanged hallux valgus and severe first MTP joint osteoarthritis. Electronically Signed   By: Obie DredgeWilliam T Derry M.D.   On: 02/13/2020 11:56      The results of significant diagnostics from this hospitalization (including imaging, microbiology, ancillary and laboratory) are listed below for reference.     Microbiology: Recent Results (from the past 240 hour(s))  SARS CORONAVIRUS 2 (TAT 6-24 HRS) Nasopharyngeal Nasopharyngeal Swab     Status: None   Collection Time: 02/28/20  1:48 PM   Specimen: Nasopharyngeal Swab  Result Value Ref Range Status   SARS Coronavirus 2 NEGATIVE NEGATIVE Final    Comment:  (NOTE) SARS-CoV-2 target nucleic acids are NOT DETECTED. The SARS-CoV-2 RNA is generally detectable in upper and lower respiratory specimens during the acute phase of infection. Negative results do not preclude SARS-CoV-2 infection, do not rule out co-infections with other pathogens, and should not be used as the sole basis for treatment or other patient management decisions. Negative results must be combined with clinical observations, patient history, and epidemiological information. The expected result is Negative. Fact Sheet for Patients: HairSlick.nohttps://www.fda.gov/media/138098/download Fact Sheet for Healthcare Providers: quierodirigir.comhttps://www.fda.gov/media/138095/download This test is not yet approved or cleared by the [place] FDA and  has been authorized for detection and/or diagnosis of SARS-CoV-2 by FDA under an Emergency Use Authorization (EUA). This EUA will remain  in effect (meaning this test can be used) for the duration of the COVID-19 declaration under Section 56 4(b)(1) of the Act, 21 U.S.C. section 360bbb-3(b)(1), unless the authorization is terminated or revoked sooner. Performed at [facility], [address] [place], [place] [id number]  SARS Coronavirus 2 by RT PCR (hospital order, performed in [facility] hospital lab) Nasopharyngeal Nasopharyngeal Swab     Status: None   Collection Time: 03/03/20  7:35 PM   Specimen: Nasopharyngeal Swab  Result Value Ref Range Status   SARS Coronavirus 2 NEGATIVE NEGATIVE Final    Comment: (NOTE) SARS-CoV-2 target nucleic acids are NOT DETECTED. The SARS-CoV-2 RNA is generally detectable in upper and lower respiratory specimens during the acute phase of infection. The lowest concentration of SARS-CoV-2 viral copies this assay can detect is 250 copies / mL. A negative result does not preclude SARS-CoV-2 infection and should not be used as the sole basis for treatment or other patient management decisions.  A negative result may  occur with improper specimen collection / handling, submission of specimen other than nasopharyngeal swab, presence of viral mutation(s) within the areas targeted by this assay, and inadequate number of viral copies (<250 copies / mL). A negative result must be combined with clinical observations, patient history, and epidemiological information. Fact Sheet for Patients:   StrictlyIdeas.no Fact Sheet for Healthcare Providers: BankingDealers.co.za This test is not yet approved or cleared  by the [place] FDA and has been authorized for detection and/or diagnosis of SARS-CoV-2 by FDA under an Emergency Use Authorization (EUA).  This EUA will remain in effect (meaning this test can be used) for the duration of the COVID-19 declaration under Section 564(b)(1) of the Act, 21 U.S.C. section 360bbb-3(b)(1), unless the authorization is terminated or revoked sooner. Performed at [facility], [place] [address] [place], [place] [address]      Labs: BNP (last 3 results) Recent Labs    04/24/19 2009  BNP 035.0*   Basic Metabolic Panel: Recent Labs  Lab 03/03/20 1721 03/03/20 1846 03/04/20 0541  NA 140 140 139  K 3.6 4.4 3.4*  CL 100 104 104  CO2  --  26 27  GLUCOSE 141* 131* 129*  BUN 8 8 6   CREATININE 0.60* 0.75 0.77  CALCIUM  --  8.5* 8.4*  MG  --   --  1.2*  PHOS  --   --  3.3   Liver Function Tests: Recent Labs  Lab 03/03/20 1846 03/04/20 0541  AST 52* 35  ALT 36 33  ALKPHOS 133* 114  BILITOT 1.7* 1.7*  PROT 6.9 6.1*  ALBUMIN 2.8* 2.7*   No results for input(s): LIPASE, AMYLASE in the last 168 hours. No results for input(s): AMMONIA in the last 168 hours. CBC: Recent Labs  Lab 03/03/20 1721 03/03/20 1846 03/04/20 0541  WBC  --  6.0 3.9*  NEUTROABS  --  4.2 2.4  HGB 10.9* 10.8* 9.1*  HCT 32.0* 32.3* 27.3*  MCV  --  98.5 98.9  PLT  --  155 123*   Cardiac Enzymes: No results for input(s): CKTOTAL,  CKMB, CKMBINDEX, TROPONINI in the last 168 hours. BNP: Invalid input(s): POCBNP CBG: Recent Labs  Lab 03/03/20 1822 03/03/20 2248 03/04/20 0739  GLUCAP 131* 149* 140*   D-Dimer No results for input(s): DDIMER in the last 72 hours. Hgb A1c No results for input(s): HGBA1C in the last 72 hours. Lipid Profile No results for input(s): CHOL, HDL, LDLCALC, TRIG, CHOLHDL, LDLDIRECT in the last 72 hours. Thyroid function studies Recent Labs    03/04/20 0541  TSH 2.577   Anemia work up No results for input(s): VITAMINB12, FOLATE, FERRITIN, TIBC, IRON, RETICCTPCT in the last 72 hours. Urinalysis No results found for: COLORURINE, APPEARANCEUR, [place], [place], [place],  HGBUR, BILIRUBINUR, KETONESUR, PROTEINUR, UROBILINOGEN, NITRITE, LEUKOCYTESUR Sepsis Labs Invalid input(s): PROCALCITONIN,  WBC,  LACTICIDVEN Microbiology Recent Results (from the past 240 hour(s))  SARS CORONAVIRUS 2 (TAT 6-24 HRS) Nasopharyngeal Nasopharyngeal Swab     Status: None   Collection Time: 02/28/20  1:48 PM   Specimen: Nasopharyngeal Swab  Result Value Ref Range Status   SARS Coronavirus 2 NEGATIVE NEGATIVE Final    Comment: (NOTE) SARS-CoV-2 target nucleic acids are NOT DETECTED. The SARS-CoV-2 RNA is generally detectable in upper and lower respiratory specimens during the acute phase of infection. Negative results do not preclude SARS-CoV-2 infection, do not rule out co-infections with other pathogens, and should not be used as the sole basis for treatment or other patient management decisions. Negative results must be combined with clinical observations, patient history, and epidemiological information. The expected result is Negative. Fact Sheet for Patients: HairSlick.no Fact Sheet for Healthcare Providers: quierodirigir.com This test is not yet approved or cleared by the [place] FDA and  has been authorized for detection and/or  diagnosis of SARS-CoV-2 by FDA under an Emergency Use Authorization (EUA). This EUA will remain  in effect (meaning this test can be used) for the duration of the COVID-19 declaration under Section 56 4(b)(1) of the Act, 21 U.S.C. section 360bbb-3(b)(1), unless the authorization is terminated or revoked sooner. Performed at [facility], [address] [place], [place] [address]   SARS Coronavirus 2 by RT PCR (hospital order, performed in [facility] hospital lab) Nasopharyngeal Nasopharyngeal Swab     Status: None   Collection Time: 03/03/20  7:35 PM   Specimen: Nasopharyngeal Swab  Result Value Ref Range Status   SARS Coronavirus 2 NEGATIVE NEGATIVE Final    Comment: (NOTE) SARS-CoV-2 target nucleic acids are NOT DETECTED. The SARS-CoV-2 RNA is generally detectable in upper and lower respiratory specimens during the acute phase of infection. The lowest concentration of SARS-CoV-2 viral copies this assay can detect is 250 copies / mL. A negative result does not preclude SARS-CoV-2 infection and should not be used as the sole basis for treatment or other patient management decisions.  A negative result may occur with improper specimen collection / handling, submission of specimen other than nasopharyngeal swab, presence of viral mutation(s) within the areas targeted by this assay, and inadequate number of viral copies (<250 copies / mL). A negative result must be combined with clinical observations, patient history, and epidemiological information. Fact Sheet for Patients:   BoilerBrush.com.cy Fact Sheet for Healthcare Providers: https://pope.com/ This test is not yet approved or cleared  by the [place] FDA and has been authorized for detection and/or diagnosis of SARS-CoV-2 by FDA under an Emergency Use Authorization (EUA).  This EUA will remain in effect (meaning this test can be used) for the duration of the COVID-19  declaration under Section 564(b)(1) of the Act, 21 U.S.C. section 360bbb-3(b)(1), unless the authorization is terminated or revoked sooner. Performed at [facility], [address], [place], [place] [address]      Time coordinating discharge:  I have spent 35 minutes face to face with the patient and on the ward discussing the patients care, assessment, plan and disposition with other care givers. >50% of the time was devoted counseling the patient about the risks and benefits of treatment/Discharge disposition and coordinating care.   SIGNED:   Dimple Nanas, MD  [facility] 03/04/2020, 10:48 AM   If 7PM-7AM, please contact night-coverage

## 2020-03-04 NOTE — Discharge Instructions (Signed)
Attached to paper chart   Post Anesthesia Home Care Instructions  Activity: Get plenty of rest for the remainder of the day. A responsible individual must stay with you for 24 hours following the procedure.  For the next 24 hours, DO NOT: -Drive a car -Advertising copywriter -Drink alcoholic beverages -Take any medication unless instructed by your physician -Make any legal decisions or sign important papers.  Meals: Start with liquid foods such as gelatin or soup. Progress to regular foods as tolerated. Avoid greasy, spicy, heavy foods. If nausea and/or vomiting occur, drink only clear liquids until the nausea and/or vomiting subsides. Call your physician if vomiting continues.  Special Instructions/Symptoms: Your throat may feel dry or sore from the anesthesia or the breathing tube placed in your throat during surgery. If this causes discomfort, gargle with warm salt water. The discomfort should disappear within 24 hours.  Call your surgeon if you experience:   1.  Fever over 101.0. 2.  Inability to urinate. 3.  Nausea and/or vomiting. 4.  Extreme swelling or bruising at the surgical site. 5.  Continued bleeding from the incision. 6.  Increased pain, redness or drainage from the incision. 7.  Problems related to your pain medication. 8.  Any problems and/or concerns  *May have Tylenol at 2:30pm today   keep dressing clean, dry, and intact to right foot  -Advised patient to continue with limited weightbearing only to the heel for bedside and bathroom only with assistance from staff but to be very careful to not want him up on his foot excessively at this time to prevent against rebleeding -Continue with rest and elevation to assist with pain and edema control  -Continue with Augmentin antibiotic as well as as needed medications that have been sent to his pharmacy for postoperative pain including Norco, Phenergan, and Colace.  Patient has a follow-up with Podaitry in office on next week  and is aware to use his wheelchair to limit any pressure to the right surgical foot and may wear cam boot to protect the foot with only pressure to the heel for transfer

## 2020-03-04 NOTE — Plan of Care (Signed)
  Problem: Education: Goal: Knowledge of General Education information will improve Description: Including pain rating scale, medication(s)/side effects and non-pharmacologic comfort measures Outcome: Progressing   Problem: Pain Managment: Goal: General experience of comfort will improve Outcome: Progressing   Problem: Safety: Goal: Ability to remain free from injury will improve Outcome: Progressing   

## 2020-03-04 NOTE — Care Management Obs Status (Signed)
MEDICARE OBSERVATION STATUS NOTIFICATION   Patient Details  Name: [name] MRN: [id number] Date of Birth: 1961/04/12   Medicare Observation Status Notification Given:  Yes    Janae Bridgeman, RN 03/04/2020, 1:23 PM

## 2020-03-04 NOTE — Plan of Care (Signed)
  Problem: Education: Goal: Knowledge of General Education information will improve Description: Including pain rating scale, medication(s)/side effects and non-pharmacologic comfort measures 03/04/2020 1029 by Loma Sousa, RN Outcome: Adequate for Discharge 03/04/2020 0746 by Loma Sousa, RN Outcome: Progressing   Problem: Health Behavior/Discharge Planning: Goal: Ability to manage health-related needs will improve Outcome: Adequate for Discharge   Problem: Clinical Measurements: Goal: Ability to maintain clinical measurements within normal limits will improve Outcome: Adequate for Discharge Goal: Will remain free from infection Outcome: Adequate for Discharge Goal: Diagnostic test results will improve Outcome: Adequate for Discharge Goal: Respiratory complications will improve Outcome: Adequate for Discharge Goal: Cardiovascular complication will be avoided Outcome: Adequate for Discharge   Problem: Activity: Goal: Risk for activity intolerance will decrease Outcome: Adequate for Discharge   Problem: Nutrition: Goal: Adequate nutrition will be maintained Outcome: Adequate for Discharge   Problem: Coping: Goal: Level of anxiety will decrease Outcome: Adequate for Discharge   Problem: Elimination: Goal: Will not experience complications related to bowel motility Outcome: Adequate for Discharge Goal: Will not experience complications related to urinary retention Outcome: Adequate for Discharge   Problem: Pain Managment: Goal: General experience of comfort will improve 03/04/2020 1029 by Loma Sousa, RN Outcome: Adequate for Discharge 03/04/2020 0746 by Loma Sousa, RN Outcome: Progressing   Problem: Safety: Goal: Ability to remain free from injury will improve 03/04/2020 1029 by Loma Sousa, RN Outcome: Adequate for Discharge 03/04/2020 0746 by Loma Sousa, RN Outcome: Progressing   Problem: Skin Integrity: Goal: Risk for  impaired skin integrity will decrease Outcome: Adequate for Discharge

## 2020-03-05 ENCOUNTER — Encounter: Payer: Self-pay | Admitting: *Deleted

## 2020-03-05 ENCOUNTER — Encounter (HOSPITAL_BASED_OUTPATIENT_CLINIC_OR_DEPARTMENT_OTHER): Payer: Medicare Other | Admitting: Physician Assistant

## 2020-03-05 LAB — HEMOGLOBIN A1C
Hgb A1c MFr Bld: 6 % — ABNORMAL HIGH (ref 4.8–5.6)
Mean Plasma Glucose: 126 mg/dL

## 2020-03-05 LAB — SURGICAL PATHOLOGY

## 2020-03-06 ENCOUNTER — Telehealth: Payer: Self-pay | Admitting: *Deleted

## 2020-03-06 NOTE — Telephone Encounter (Signed)
Pt was on TCM report admitted 03/04/20 for Postop acute blood loss amenia/excessive bleeding from (R) foot metatarsal osteomyelitis in which antibiotic therapy had failed. The area was cauterized, debridement was performed w/ graft placement. Pt D/C 03/04/20, and will f/u w/specialist Dr. Marylene Land 03/11/20 @ [facility].Marland KitchenRaechel Chute

## 2020-03-07 ENCOUNTER — Telehealth: Payer: Self-pay | Admitting: *Deleted

## 2020-03-07 NOTE — Telephone Encounter (Signed)
Yes I talked with him and reviewed the images. Told him to monitor if bleeding worsens to call office/on call service and to stay off foot as much as possible and to continue with rest, ice, elevation. -Dr. Kathie Rhodes

## 2020-03-07 NOTE — Telephone Encounter (Signed)
Patient is a post op patient (Dr. Marylene Land), having some bleeding of the surgery foot and was told if it bleeds to contact our office. He had sent pictures and wanted to verify that pictures have been received.  Please call.

## 2020-03-07 NOTE — Telephone Encounter (Signed)
I spoke with pt and he had noticed some bleeding at 1:00pm and called about 4:00pm. I asked pt if the gauze was wet at the area of blood and he states he reinforced the area with more gauze and got off the foot and it is better. I told pt that if it bleed through to mark the area and call again, but it was sterile and would probably harden to make a protective splint. Pt states he sent pictures to Dr. Wynema Birch phone because his girlfriend knows Dr. Marylene Land. I told pt I would message Dr. Marylene Land to check her phone.

## 2020-03-09 ENCOUNTER — Other Ambulatory Visit: Payer: Self-pay | Admitting: Sports Medicine

## 2020-03-09 MED ORDER — AMOXICILLIN-POT CLAVULANATE 875-125 MG PO TABS: 1.0000 | ORAL_TABLET | Freq: Two times a day (BID) | ORAL | 0 refills | Status: AC

## 2020-03-09 NOTE — Progress Notes (Signed)
Rx for Augmentin resent to patient pharmacy. He reports that he did not get the Rx that was sent on 5/18 by Dr. Nelson Chimes at discharge. Patient notified me of this on 5/22 at 921pm via text and checked with his pharmacy today and told me that his pharmacy did not have it. I sent the rx again on the patient's behalf. After I electronically sent the Rx I spoke to Fayrene Fearing, pharmacy staff at [facility] who confirmed receipt of Rx. -Dr. Marylene Land

## 2020-03-11 ENCOUNTER — Encounter: Payer: Self-pay | Admitting: Sports Medicine

## 2020-03-11 ENCOUNTER — Ambulatory Visit (INDEPENDENT_AMBULATORY_CARE_PROVIDER_SITE_OTHER): Payer: Medicare Other | Admitting: Sports Medicine

## 2020-03-11 ENCOUNTER — Ambulatory Visit (INDEPENDENT_AMBULATORY_CARE_PROVIDER_SITE_OTHER): Payer: Medicare Other

## 2020-03-11 ENCOUNTER — Other Ambulatory Visit: Payer: Self-pay

## 2020-03-11 VITALS — BP 124/70 | HR 86 | Temp 97.8°F

## 2020-03-11 DIAGNOSIS — G629 Polyneuropathy, unspecified: Secondary | ICD-10-CM

## 2020-03-11 DIAGNOSIS — L97512 Non-pressure chronic ulcer of other part of right foot with fat layer exposed: Secondary | ICD-10-CM

## 2020-03-11 DIAGNOSIS — M86171 Other acute osteomyelitis, right ankle and foot: Secondary | ICD-10-CM | POA: Diagnosis not present

## 2020-03-11 DIAGNOSIS — T148XXA Other injury of unspecified body region, initial encounter: Secondary | ICD-10-CM

## 2020-03-11 DIAGNOSIS — L03031 Cellulitis of right toe: Secondary | ICD-10-CM

## 2020-03-11 DIAGNOSIS — Z9889 Other specified postprocedural states: Secondary | ICD-10-CM

## 2020-03-11 DIAGNOSIS — T8130XA Disruption of wound, unspecified, initial encounter: Secondary | ICD-10-CM

## 2020-03-11 NOTE — Progress Notes (Addendum)
Subjective: [name] is a 59 y.o. male patient seen today in office for POV #1 (DOS 03-03-20), S/P right second metatarsal head removal and plantar wound debridement with application of graft with repeat return to the OR for cauterization of bleeding vessels. Patient denies pain at surgical site except the issue of bleeding a lot and having to reinforce on Friday with some odor with the bloody dressing, denies nausea, vomiting, fever, or chills.  Reports that he finally got his antibiotic on Sunday and started taking it and continuation of Augmentin like he was given prior by Dr. Leonor Liv.  Patient reports that he has some frustrations and concerns about how his care was handled at the surgical center prior to going to [facility] for the cauterization procedure reports that the nurses seem to not realize that he was bleeding until it was brought to the attention by his wife to the staff that he was bleeding. No other issues noted.   Patient Active Problem List   Diagnosis Date Noted  . Amputated toe of right foot (HCC) 03/03/2020  . Bleeding 03/03/2020  . Acute postoperative anemia due to greater than expected blood loss 03/03/2020  . Osteomyelitis of ankle or foot, right, acute (HCC) 02/27/2020  . Clogged ear, left 01/22/2020  . Skin abnormality 01/22/2020  . Alcoholic peripheral neuropathy (HCC) 10/28/2019  . Chronic diastolic CHF (congestive heart failure) (HCC) 10/15/2019  . Chronic foot ulcer (HCC) 10/15/2019  . SIRS (systemic inflammatory response syndrome) (HCC) 10/15/2019  . Lymphedema 05/04/2019  . Alcoholic cirrhosis of liver without ascites (HCC) 04/27/2019  . Obesity hypoventilation syndrome (HCC) 04/27/2019  . Depression 04/25/2019  . Severe alcohol use disorder (HCC)   . DOE (dyspnea on exertion) 04/22/2019  . Hypothyroidism 04/22/2019  . Anemia 02/06/2019  . Avascular necrosis of hip, left (HCC) 11/02/2018  . Decreased hearing of both ears 10/30/2018  . Avascular necrosis of hip,  right (HCC) 08/16/2018  . Bilateral leg edema 02/27/2018  . Marijuana abuse 02/16/2018  . Ulcer of left heel (HCC) 12/21/2016  . Bilateral carpal tunnel syndrome 12/15/2016  . Fatty liver 12/14/2016  . Hyperlipidemia 11/25/2016  . Elevated liver enzymes 11/25/2016  . Prediabetes 11/25/2016  . Hypertension 11/23/2016  . Osteomyelitis due to type 2 diabetes mellitus (HCC) 11/23/2016  . Morbid obesity (HCC) 11/23/2016  . OSA (obstructive sleep apnea) 11/23/2016  . Other hammer toe (acquired) 11/11/2013  . Exstrophy of bladder 11/11/2013    Current Outpatient Medications on File Prior to Visit  Medication Sig Dispense Refill  . amoxicillin-clavulanate (AUGMENTIN) 875-125 MG tablet Take 1 tablet by mouth every 12 (twelve) hours for 10 days. 20 tablet 0  . cholecalciferol (VITAMIN D3) 25 MCG (1000 UNIT) tablet Take 1,000 Units by mouth daily.    Marland Kitchen docusate sodium (COLACE) 100 MG capsule Take 1 capsule (100 mg total) by mouth daily as needed. 30 capsule 2  . DULoxetine (CYMBALTA) 20 MG capsule Take 1 capsule (20 mg total) by mouth daily. 90 capsule 1  . furosemide (LASIX) 40 MG tablet TAKE 1 TABLET (40 MG TOTAL) BY MOUTH 2 (TWO) TIMES DAILY. (Patient taking differently: Take 40 mg by mouth 2 (two) times daily. ) 180 tablet 1  . KLOR-CON M20 20 MEQ tablet TAKE 2 TABLETS (40 MEQ TOTAL) BY MOUTH 3 (THREE) TIMES DAILY. (Patient taking differently: Take 40 mEq by mouth 3 (three) times daily. ) 180 tablet 1  . levothyroxine (SYNTHROID) 50 MCG tablet Take 1 tablet (50 mcg total) by mouth daily. Need  office visit for more refills. 30 tablet 0  . promethazine (PHENERGAN) 50 MG tablet Take 1 tablet (50 mg total) by mouth every 6 (six) hours as needed for nausea or vomiting. 30 tablet 0  . thiamine 100 MG tablet TAKE 1 TABLET BY MOUTH EVERY DAY (Patient taking differently: Take 100 mg by mouth daily. ) 100 tablet 0  . traZODone (DESYREL) 50 MG tablet TAKE 1-2 TABLETS (50-100 MG TOTAL) BY MOUTH AT BEDTIME  AS NEEDED FOR SLEEP. 180 tablet 1  . TURMERIC PO Take 1 tablet by mouth daily.     . vitamin B-12 (CYANOCOBALAMIN) 100 MCG tablet Take 100 mcg by mouth daily.     No current facility-administered medications on file prior to visit.    Allergies  Allergen Reactions  . Claritin [Loratadine] Shortness Of Breath and Anxiety    Objective: There were no vitals filed for this visit.  General: No acute distress, AAOx3  Right foot: Sutures with gapping and hematoma with dehiscence at surgical site dorsally, plantar wound graft stapled in place plantarly with mild swelling to right foot, no erythema, no warmth, no drainage, no signs of infection noted, Capillary fill time <3 seconds in all digits, gross sensation present via light touch to right. No pain or crepitation with range of motion right foot.  No pain with calf compression.   Assessment and Plan:  Problem List Items Addressed This Visit    None    Visit Diagnoses    Ulcer of right foot with fat layer exposed ([place])    -  Primary   Relevant Orders   DG Foot Complete Right   Hematoma       Wound dehiscence       Cellulitis of toe of right foot       Osteomyelitis of foot, right, acute ([place])       Relevant Orders   DG Foot Complete Right   S/P foot surgery, right       Neuropathy           -Patient seen and evaluated -Xrays consistent with post op status -Applied surgifoam to dorsal aspect and adaptic and steristrips plantar and dry sterile dressing to surgical site right foot secured with coban and ACE wrap and stockinet  -Advised patient if there is bleeding to take picture, notify my office and re-inforce outer layers until he can get further instructions  -Rx home nurse to assist with dressing changes once weekly as above -Advised patient to continue with CAM boot and to limit activity  -Advised patient to continue with rest and elevation -Advised patient to continue with Augmentin -Will plan for post op wound care at next  office visit. In the meantime, patient to call office if any issues or problems arise.  Advised patient that he has access to his medical records if you elect obtain records from his time at [facility] can contact [place] medical record department to get these documents.  Landis Martins, DPM

## 2020-03-12 ENCOUNTER — Ambulatory Visit (HOSPITAL_BASED_OUTPATIENT_CLINIC_OR_DEPARTMENT_OTHER): Payer: Medicare Other | Admitting: Physician Assistant

## 2020-03-13 ENCOUNTER — Telehealth: Payer: Self-pay | Admitting: *Deleted

## 2020-03-13 NOTE — Telephone Encounter (Signed)
-----   Message from Asencion Islam, [place] sent at 03/11/2020  8:10 PM EDT ----- Regarding: Home nursing wound care Please contact a home nursing agency that can take patient this week to see him to change his right foot dressing this week on Friday on a once weekly schedule, apply surgifoam to dorsal foot wound/hematoma and adaptic to plantar ulcer (patient has a graft stapled in place) covered with dry dressing

## 2020-03-13 NOTE — Telephone Encounter (Signed)
Faxed required form, clinicals and demographics to [facility]. 

## 2020-03-14 ENCOUNTER — Telehealth: Payer: Self-pay | Admitting: *Deleted

## 2020-03-14 NOTE — Telephone Encounter (Signed)
I spoke with Marcelino Duster - [facility] and okayed requested orders.

## 2020-03-14 NOTE — Telephone Encounter (Signed)
[facility] - Marcelino Duster states they need verbal orders for skilled nursing wound care and instruction weekly and substitute collagen for surgifoam that they do not carry.

## 2020-03-14 NOTE — Telephone Encounter (Signed)
Thanks Dr. Jori Thrall!!! ?

## 2020-03-18 ENCOUNTER — Encounter: Payer: Self-pay | Admitting: Sports Medicine

## 2020-03-18 ENCOUNTER — Other Ambulatory Visit: Payer: Self-pay

## 2020-03-18 ENCOUNTER — Ambulatory Visit (INDEPENDENT_AMBULATORY_CARE_PROVIDER_SITE_OTHER): Payer: Medicare Other | Admitting: Sports Medicine

## 2020-03-18 VITALS — Temp 97.6°F

## 2020-03-18 DIAGNOSIS — T8130XA Disruption of wound, unspecified, initial encounter: Secondary | ICD-10-CM

## 2020-03-18 DIAGNOSIS — L03031 Cellulitis of right toe: Secondary | ICD-10-CM

## 2020-03-18 DIAGNOSIS — T148XXA Other injury of unspecified body region, initial encounter: Secondary | ICD-10-CM

## 2020-03-18 DIAGNOSIS — L97512 Non-pressure chronic ulcer of other part of right foot with fat layer exposed: Secondary | ICD-10-CM

## 2020-03-18 MED ORDER — SULFAMETHOXAZOLE-TRIMETHOPRIM 800-160 MG PO TABS: 1.0000 | ORAL_TABLET | Freq: Two times a day (BID) | ORAL | 0 refills | Status: DC

## 2020-03-18 NOTE — Progress Notes (Signed)
Subjective: [name] is a 59 y.o. male patient seen today in office for POV #2 (DOS 03-03-20), S/P right second metatarsal head removal and plantar wound debridement with application of graft with repeat return to the OR for cauterization of bleeding vessels. Patient admits that he is still concerned because he has not formed a hard scab and is still having issues with bleeding reports that on Sunday noticed some bleeding and then on Monday more bleeding and odor.  Patient denies nausea vomiting fever or chills but did have one episode this morning of feeling funny when he first woke up reports that he has 2-3 more days worth of amoxicillin left.  No other pedal complaints noted.  Patient Active Problem List   Diagnosis Date Noted  . Amputated toe of right foot (HCC) 03/03/2020  . Bleeding 03/03/2020  . Acute postoperative anemia due to greater than expected blood loss 03/03/2020  . Osteomyelitis of ankle or foot, right, acute (HCC) 02/27/2020  . Clogged ear, left 01/22/2020  . Skin abnormality 01/22/2020  . Alcoholic peripheral neuropathy (HCC) 10/28/2019  . Chronic diastolic CHF (congestive heart failure) (HCC) 10/15/2019  . Chronic foot ulcer (HCC) 10/15/2019  . SIRS (systemic inflammatory response syndrome) (HCC) 10/15/2019  . Lymphedema 05/04/2019  . Alcoholic cirrhosis of liver without ascites (HCC) 04/27/2019  . Obesity hypoventilation syndrome (HCC) 04/27/2019  . Depression 04/25/2019  . Severe alcohol use disorder (HCC)   . DOE (dyspnea on exertion) 04/22/2019  . Hypothyroidism 04/22/2019  . Anemia 02/06/2019  . Avascular necrosis of hip, left (HCC) 11/02/2018  . Decreased hearing of both ears 10/30/2018  . Avascular necrosis of hip, right (HCC) 08/16/2018  . Bilateral leg edema 02/27/2018  . Marijuana abuse 02/16/2018  . Ulcer of left heel (HCC) 12/21/2016  . Bilateral carpal tunnel syndrome 12/15/2016  . Fatty liver 12/14/2016  . Hyperlipidemia 11/25/2016  . Elevated  liver enzymes 11/25/2016  . Prediabetes 11/25/2016  . Hypertension 11/23/2016  . Osteomyelitis due to type 2 diabetes mellitus (HCC) 11/23/2016  . Morbid obesity (HCC) 11/23/2016  . OSA (obstructive sleep apnea) 11/23/2016  . Other hammer toe (acquired) 11/11/2013  . Exstrophy of bladder 11/11/2013    Current Outpatient Medications on File Prior to Visit  Medication Sig Dispense Refill  . amoxicillin-clavulanate (AUGMENTIN) 875-125 MG tablet Take 1 tablet by mouth every 12 (twelve) hours for 10 days. 20 tablet 0  . cholecalciferol (VITAMIN D3) 25 MCG (1000 UNIT) tablet Take 1,000 Units by mouth daily.    Marland Kitchen docusate sodium (COLACE) 100 MG capsule Take 1 capsule (100 mg total) by mouth daily as needed. 30 capsule 2  . DULoxetine (CYMBALTA) 20 MG capsule Take 1 capsule (20 mg total) by mouth daily. 90 capsule 1  . furosemide (LASIX) 40 MG tablet TAKE 1 TABLET (40 MG TOTAL) BY MOUTH 2 (TWO) TIMES DAILY. (Patient taking differently: Take 40 mg by mouth 2 (two) times daily. ) 180 tablet 1  . KLOR-CON M20 20 MEQ tablet TAKE 2 TABLETS (40 MEQ TOTAL) BY MOUTH 3 (THREE) TIMES DAILY. (Patient taking differently: Take 40 mEq by mouth 3 (three) times daily. ) 180 tablet 1  . levothyroxine (SYNTHROID) 50 MCG tablet Take 1 tablet (50 mcg total) by mouth daily. Need office visit for more refills. 30 tablet 0  . promethazine (PHENERGAN) 50 MG tablet Take 1 tablet (50 mg total) by mouth every 6 (six) hours as needed for nausea or vomiting. 30 tablet 0  . thiamine 100 MG tablet TAKE 1  TABLET BY MOUTH EVERY DAY (Patient taking differently: Take 100 mg by mouth daily. ) 100 tablet 0  . traZODone (DESYREL) 50 MG tablet TAKE 1-2 TABLETS (50-100 MG TOTAL) BY MOUTH AT BEDTIME AS NEEDED FOR SLEEP. 180 tablet 1  . TURMERIC PO Take 1 tablet by mouth daily.     . vitamin B-12 (CYANOCOBALAMIN) 100 MCG tablet Take 100 mcg by mouth daily.     No current facility-administered medications on file prior to visit.     Allergies  Allergen Reactions  . Claritin [Loratadine] Shortness Of Breath and Anxiety    Objective: There were no vitals filed for this visit.  General: No acute distress, AAOx3  Right foot: Dorsal foot wound and plantar wound with hematoma present dorsally wound that measures 3 x 1.5 x 0.6 cm with a soft hematoma in the wound bed the plantar wound however is 100% granular with no evidence of hematoma with mild surrounding macerated tissue that measures 3 x 2 cm appears to be getting smaller in size as compared to the dorsal wound the dorsal wound also has broken stitches and displays signs of dehiscence, there is malodor from the dorsal wound, minimal erythema, no warmth, ++ bloody drainage. No pain or crepitation with range of motion right foot or with palpation to wound.  No pain with calf compression.   Assessment and Plan:  Problem List Items Addressed This Visit    None    Visit Diagnoses    Ulcer of right foot with fat layer exposed ([place])    -  Primary   Hematoma       Wound dehiscence       Cellulitis of toe of right foot         -Patient seen and evaluated -Discussed with patient plan for wound care especially with soft hematoma at the dorsal wound advised patient due to the extensive nature of the hematoma skin edges cannot be glued or taped together also advised patient due to the active hematoma cannot suture because there is not enough good skin as well as with each entry point of the suture needle it will cause him to bleed which could make things continue to worsen or the sutures to fail -At this time I recommend continue with more aggressive wound care and recommend wound VAC for the dorsal wound on the right foot -Using a sterile tissue nipper and pickup debrided soft hematoma and applied silver nitrate sticks to the area then packed the dorsal wound with sterile gauze packing and surgifoam to dorsal aspect and adaptic plantar and dry sterile dressing to surgical site  right foot secured with coban and stockinet  -Advised patient to call office if there is any problems or to text my phone if there is any issues with excessive bleeding -Continue with Augmentin until completed and then to start Bactrim -Rx home nurse to assist with dressing changes once weekly and apply wound vac to the right dorsal foot as instructed -Advised patient to continue with CAM boot and to limit activity to prevent against areas continued to bleed -Advised patient to continue with rest and elevation -Return to office as scheduled on next week for continued wound care on the right and advised patient to continue with his current wound care on the left patient reports at this time he has held off on going to the wound center for the left since he is following the weekly for this acute right foot issue.  Landis Martins, DPM

## 2020-03-19 ENCOUNTER — Telehealth: Payer: Self-pay | Admitting: *Deleted

## 2020-03-19 NOTE — Telephone Encounter (Signed)
-----   Message from Asencion Islam, [place] sent at 03/18/2020  8:43 PM EDT ----- Regarding: [facility] wound orders [facility] to start wound VAC treatment to the right dorsal foot ulceration; apply once weekly on Fridays and he will come to my office on Tuesday for me to change the [facility] dressing pressure should be set at 100 mmHg and may cover the wound bed dorsally with a small amount of Adaptic before applying the sponge so that way the sponge will not stick to the hematoma in the wound bed of the right foot and also apply Adaptic to the bottom of the right foot. If there are any questions about this wound VAC care please call office.

## 2020-03-19 NOTE — Telephone Encounter (Signed)
Spoke with Brett Albino at [facility] and faxed the orders to the facility at [phone] and the phone number is [phone] option 2. Misty Stanley

## 2020-03-19 NOTE — Telephone Encounter (Signed)
Amber from [employer] is the physical therapist and would like for the patient to come in once a week for strengthening and work with him for about 8 weeks and the patient is un happy about the decline and Amber wanted to know as well is the patient non weight bearing on the right foot and per Dr Marylene Land the patient can do physical  therapy and the patient is limited to weight bearing with the cam boot and as long as it is not causing issues with the bleeding at the surgical foot and relayed the message to [facility] at [facility] and the phone number to her is [phone]. Misty Stanley

## 2020-03-20 ENCOUNTER — Telehealth: Payer: Self-pay | Admitting: Internal Medicine

## 2020-03-20 ENCOUNTER — Telehealth: Payer: Self-pay | Admitting: *Deleted

## 2020-03-20 NOTE — Telephone Encounter (Signed)
New message:    Pt is calling and states he needs a renewal for his bedside oxygen tank. He states Dr. Lawerance Bach was not the one who originally prescribed the oxygen it was a physician at the hospital. Pt states if he needs to make an appt to come in then he will. But pt also states he only has 30 days to give [facility] a Dr.'s name and phone number for them to reach out. Please advise.

## 2020-03-20 NOTE — Telephone Encounter (Signed)
Faxed Dr. Wynema Birch 03/18/2020 8:43pm orders to [facility].

## 2020-03-20 NOTE — Telephone Encounter (Signed)
-----   Message from Titorya Stover, DPM sent at 03/18/2020  8:43 PM EDT ----- Regarding: [facility] wound orders [facility] for [facility] to start wound VAC treatment to the right dorsal foot ulceration; apply once weekly on Fridays and he will come to my office on Tuesday for me to change the VAC dressing pressure should be set at 100 mmHg and may cover the wound bed dorsally with a small amount of Adaptic before applying the sponge so that way the sponge will not stick to the hematoma in the wound bed of the right foot and also apply Adaptic to the bottom of the right foot. If there are any questions about this wound VAC care please call office.  

## 2020-03-21 ENCOUNTER — Encounter: Payer: Self-pay | Admitting: Internal Medicine

## 2020-03-21 ENCOUNTER — Telehealth: Payer: Self-pay

## 2020-03-21 NOTE — Telephone Encounter (Signed)
Agree he can not do 6 min walk - I have advised he needs to see pulm

## 2020-03-21 NOTE — Telephone Encounter (Signed)
LVM letting pt know.  

## 2020-03-21 NOTE — Telephone Encounter (Signed)
New message    The patient has been in contact with [facility] and they advise you to have a 6 min walk monitor patient unable to do that at this time   S/p ulcer on the foot

## 2020-03-21 NOTE — Telephone Encounter (Signed)
F/u   Patient calling back with additional info  Dr. Marylene Land with [facility]

## 2020-03-21 NOTE — Telephone Encounter (Signed)
Does he need a referral to pulmonary?

## 2020-03-21 NOTE — Telephone Encounter (Signed)
Yes he should see pulm  - I did refer him in April for sleep apnea eval and they tried to call him to set something up but he never set up an appt......... let him know he should call them.  He can then get the different oxygen tanks he wants, etc.

## 2020-03-24 ENCOUNTER — Telehealth: Payer: Self-pay

## 2020-03-24 DIAGNOSIS — E662 Morbid (severe) obesity with alveolar hypoventilation: Secondary | ICD-10-CM

## 2020-03-24 DIAGNOSIS — I5032 Chronic diastolic (congestive) heart failure: Secondary | ICD-10-CM

## 2020-03-24 NOTE — Telephone Encounter (Signed)
Message left for patient today with Dr. Burns recommendations 

## 2020-03-24 NOTE — Telephone Encounter (Signed)
New message    The patient calling aware of the message from the CMA.   Asking for a call back to discuss what Pulmonary would do for him does not quite understand.    Please advise

## 2020-03-25 ENCOUNTER — Encounter: Payer: Self-pay | Admitting: Sports Medicine

## 2020-03-25 ENCOUNTER — Other Ambulatory Visit: Payer: Self-pay

## 2020-03-25 ENCOUNTER — Ambulatory Visit (INDEPENDENT_AMBULATORY_CARE_PROVIDER_SITE_OTHER): Payer: Medicare Other

## 2020-03-25 ENCOUNTER — Ambulatory Visit (INDEPENDENT_AMBULATORY_CARE_PROVIDER_SITE_OTHER): Payer: Medicare Other | Admitting: Sports Medicine

## 2020-03-25 ENCOUNTER — Encounter: Payer: Medicare Other | Admitting: Sports Medicine

## 2020-03-25 VITALS — Temp 97.3°F

## 2020-03-25 DIAGNOSIS — M86171 Other acute osteomyelitis, right ankle and foot: Secondary | ICD-10-CM

## 2020-03-25 DIAGNOSIS — L97512 Non-pressure chronic ulcer of other part of right foot with fat layer exposed: Secondary | ICD-10-CM

## 2020-03-25 DIAGNOSIS — L03031 Cellulitis of right toe: Secondary | ICD-10-CM

## 2020-03-25 DIAGNOSIS — T8130XA Disruption of wound, unspecified, initial encounter: Secondary | ICD-10-CM

## 2020-03-25 DIAGNOSIS — Z9889 Other specified postprocedural states: Secondary | ICD-10-CM

## 2020-03-25 DIAGNOSIS — T148XXA Other injury of unspecified body region, initial encounter: Secondary | ICD-10-CM

## 2020-03-25 DIAGNOSIS — G629 Polyneuropathy, unspecified: Secondary | ICD-10-CM

## 2020-03-25 NOTE — Telephone Encounter (Signed)
Emailed Efraim Kaufmann and [place] today with [facility] for DME referral.

## 2020-03-25 NOTE — Progress Notes (Signed)
Subjective: [name] is a 59 y.o. male patient seen today in office for POV #3 (DOS 03-03-20), S/P right second metatarsal head removal and plantar wound debridement with application of graft with repeat return to the OR for cauterization of bleeding vessels. Patient reports less bleeding and minimal bloody strike through.  Patient denies nausea vomiting fever or chills but reports that he thinks antibiotics is messing with belly and taste.  No other pedal complaints noted.  Patient Active Problem List   Diagnosis Date Noted  . Amputated toe of right foot (HCC) 03/03/2020  . Bleeding 03/03/2020  . Acute postoperative anemia due to greater than expected blood loss 03/03/2020  . Osteomyelitis of ankle or foot, right, acute (HCC) 02/27/2020  . Clogged ear, left 01/22/2020  . Skin abnormality 01/22/2020  . Alcoholic peripheral neuropathy (HCC) 10/28/2019  . Chronic diastolic CHF (congestive heart failure) (HCC) 10/15/2019  . Chronic foot ulcer (HCC) 10/15/2019  . SIRS (systemic inflammatory response syndrome) (HCC) 10/15/2019  . Lymphedema 05/04/2019  . Alcoholic cirrhosis of liver without ascites (HCC) 04/27/2019  . Obesity hypoventilation syndrome (HCC) 04/27/2019  . Depression 04/25/2019  . Severe alcohol use disorder (HCC)   . DOE (dyspnea on exertion) 04/22/2019  . Hypothyroidism 04/22/2019  . Anemia 02/06/2019  . Avascular necrosis of hip, left (HCC) 11/02/2018  . Decreased hearing of both ears 10/30/2018  . Avascular necrosis of hip, right (HCC) 08/16/2018  . Bilateral leg edema 02/27/2018  . Marijuana abuse 02/16/2018  . Ulcer of left heel (HCC) 12/21/2016  . Bilateral carpal tunnel syndrome 12/15/2016  . Fatty liver 12/14/2016  . Hyperlipidemia 11/25/2016  . Elevated liver enzymes 11/25/2016  . Prediabetes 11/25/2016  . Hypertension 11/23/2016  . Osteomyelitis due to type 2 diabetes mellitus (HCC) 11/23/2016  . Morbid obesity (HCC) 11/23/2016  . OSA (obstructive sleep  apnea) 11/23/2016  . Other hammer toe (acquired) 11/11/2013  . Exstrophy of bladder 11/11/2013    Current Outpatient Medications on File Prior to Visit  Medication Sig Dispense Refill  . cholecalciferol (VITAMIN D3) 25 MCG (1000 UNIT) tablet Take 1,000 Units by mouth daily.    Marland Kitchen docusate sodium (COLACE) 100 MG capsule Take 1 capsule (100 mg total) by mouth daily as needed. 30 capsule 2  . DULoxetine (CYMBALTA) 20 MG capsule Take 1 capsule (20 mg total) by mouth daily. 90 capsule 1  . furosemide (LASIX) 40 MG tablet TAKE 1 TABLET (40 MG TOTAL) BY MOUTH 2 (TWO) TIMES DAILY. (Patient taking differently: Take 40 mg by mouth 2 (two) times daily. ) 180 tablet 1  . KLOR-CON M20 20 MEQ tablet TAKE 2 TABLETS (40 MEQ TOTAL) BY MOUTH 3 (THREE) TIMES DAILY. (Patient taking differently: Take 40 mEq by mouth 3 (three) times daily. ) 180 tablet 1  . levothyroxine (SYNTHROID) 50 MCG tablet Take 1 tablet (50 mcg total) by mouth daily. Need office visit for more refills. 30 tablet 0  . promethazine (PHENERGAN) 50 MG tablet Take 1 tablet (50 mg total) by mouth every 6 (six) hours as needed for nausea or vomiting. 30 tablet 0  . sulfamethoxazole-trimethoprim (BACTRIM DS) 800-160 MG tablet Take 1 tablet by mouth 2 (two) times daily. 28 tablet 0  . thiamine 100 MG tablet TAKE 1 TABLET BY MOUTH EVERY DAY (Patient taking differently: Take 100 mg by mouth daily. ) 100 tablet 0  . traZODone (DESYREL) 50 MG tablet TAKE 1-2 TABLETS (50-100 MG TOTAL) BY MOUTH AT BEDTIME AS NEEDED FOR SLEEP. 180 tablet 1  .  TURMERIC PO Take 1 tablet by mouth daily.     . vitamin B-12 (CYANOCOBALAMIN) 100 MCG tablet Take 100 mcg by mouth daily.     No current facility-administered medications on file prior to visit.    Allergies  Allergen Reactions  . Claritin [Loratadine] Shortness Of Breath and Anxiety    Objective: There were no vitals filed for this visit.  General: No acute distress, AAOx3  Right foot: Dorsal foot wound and  plantar wound with hematoma present dorsally wound that measures 2.8 x 1.5 x 0.6 cm with a soft hematoma in the wound bed the plantar wound however is 100% granular with no evidence of hematoma with mild surrounding macerated tissue that measures 3.2 x 1.5 cm appears to be getting smaller in size as compared to the dorsal wound the dorsal wound also has broken stitches and displays signs of dehiscence, there is malodor from the dorsal wound, minimal erythema, no warmth, + bloody drainage. No pain or crepitation with range of motion right foot or with palpation to wound.  No pain with calf compression.   Assessment and Plan:  Problem List Items Addressed This Visit    None    Visit Diagnoses    Ulcer of right foot with fat layer exposed ([place])    -  Primary   Relevant Orders   DG Foot Complete Right (Completed)   Hematoma       Wound dehiscence       Cellulitis of toe of right foot       Osteomyelitis of foot, right, acute (HCC)       S/P foot surgery, right       Neuropathy         -Patient seen and evaluated -Discussed with patient plan for wound care -Using a sterile tissue nipper and pickup debrided soft hematoma and applied iodoform packing to the dorsal wound with sterile gauze packing and adaptic plantar and dry sterile dressing to surgical site right foot secured with coban and stockinet  -Continue with Bactrim until completed and advised yogurt or probiotic -Awaiting wound vac continue with home nursing on fridays to using packing until we can get wound vac for dorsal wound and to apply adaptic to plantar wound -Patient to start PT on thursday -Advised patient to continue with CAM boot and to limit activity to prevent against areas continued to bleed like before, patient presents to office in normal shoe because he had to drive -Advised patient to continue with rest and elevation like before -Return to office as scheduled on next week for continued wound care/VAC.   Landis Martins,  DPM

## 2020-03-25 NOTE — Telephone Encounter (Signed)
printed

## 2020-03-26 ENCOUNTER — Other Ambulatory Visit: Payer: Self-pay

## 2020-03-26 ENCOUNTER — Telehealth: Payer: Self-pay | Admitting: Internal Medicine

## 2020-03-26 ENCOUNTER — Other Ambulatory Visit: Payer: Self-pay | Admitting: Internal Medicine

## 2020-03-26 NOTE — Telephone Encounter (Signed)
I have advised him ideally he should get the recertification from pulmonary.  If he is not able to see pulmonary before the recertification as needed I can do it.

## 2020-03-26 NOTE — Telephone Encounter (Signed)
New message:    1.Medication Requested: KLOR-CON M20 20 MEQ tablet Metoprol 50 mg levothyroxine (SYNTHROID) 50 MCG tablet 2. Pharmacy (Name, Street, [place]): [id number] - [place], [place] - [address] 3. On Med List: Yes/No  4. Last Visit with PCP: 02/25/20  5. Next visit date with PCP: None   Pt states he is getting a call that he needs re-certification to see if he still needs the oxygen. Pt states he was prescribed this oxygen while in the hospital by Dr. Butler Denmark. He states this is coming from [facility] but they never told him who he needs to get this re-certification through. Please advise.   Agent: Please be advised that RX refills may take up to 3 business days. We ask that you follow-up with your pharmacy.

## 2020-03-27 ENCOUNTER — Encounter: Payer: Medicare Other | Admitting: Sports Medicine

## 2020-03-27 ENCOUNTER — Other Ambulatory Visit: Payer: Self-pay

## 2020-03-27 MED ORDER — LEVOTHYROXINE SODIUM 50 MCG PO TABS: 50.0000 ug | ORAL_TABLET | Freq: Every day | ORAL | 0 refills | Status: DC

## 2020-03-27 MED ORDER — POTASSIUM CHLORIDE CRYS ER 20 MEQ PO TBCR: EXTENDED_RELEASE_TABLET | ORAL | 1 refills | Status: DC

## 2020-03-27 NOTE — Telephone Encounter (Signed)
Called patient today but unable to leave a VM. VM is full. My-chart message sent to him with response.

## 2020-04-01 ENCOUNTER — Encounter: Payer: Medicare Other | Admitting: Sports Medicine

## 2020-04-01 ENCOUNTER — Other Ambulatory Visit: Payer: Self-pay

## 2020-04-03 ENCOUNTER — Telehealth: Payer: Self-pay | Admitting: *Deleted

## 2020-04-03 NOTE — Telephone Encounter (Signed)
Faxed Dr. Wynema Birch orders of 04/01/2020 6:15pm to [facility].

## 2020-04-03 NOTE — Telephone Encounter (Signed)
Dr. Wynema Birch orders of 04/01/2020 6:15pmLisa sent order to [facility] when she was covering for you or helping out.  Also send orders to see to they can go out tomorrow to do a dressing change since the patient left and did not stay today in office for dressing to be changed

## 2020-04-04 ENCOUNTER — Encounter: Payer: Self-pay | Admitting: Sports Medicine

## 2020-04-04 NOTE — Telephone Encounter (Signed)
I called [id number] - S. Earlene Plater and she requested pt's name and DOB emailed to her and she would check the status. Emailed pt information.

## 2020-04-07 NOTE — Telephone Encounter (Signed)
Unable to get fax to transmit to [phone], so S. Earlene Plater gave [phone] to fax and I faxed to the alternate fax number.

## 2020-04-08 ENCOUNTER — Encounter: Payer: Self-pay | Admitting: Sports Medicine

## 2020-04-08 ENCOUNTER — Ambulatory Visit (INDEPENDENT_AMBULATORY_CARE_PROVIDER_SITE_OTHER): Payer: Medicare Other | Admitting: Sports Medicine

## 2020-04-08 ENCOUNTER — Other Ambulatory Visit: Payer: Self-pay

## 2020-04-08 DIAGNOSIS — T148XXA Other injury of unspecified body region, initial encounter: Secondary | ICD-10-CM

## 2020-04-08 DIAGNOSIS — L03031 Cellulitis of right toe: Secondary | ICD-10-CM

## 2020-04-08 DIAGNOSIS — T8130XA Disruption of wound, unspecified, initial encounter: Secondary | ICD-10-CM

## 2020-04-08 DIAGNOSIS — G629 Polyneuropathy, unspecified: Secondary | ICD-10-CM

## 2020-04-08 DIAGNOSIS — L97512 Non-pressure chronic ulcer of other part of right foot with fat layer exposed: Secondary | ICD-10-CM

## 2020-04-08 DIAGNOSIS — Z9889 Other specified postprocedural states: Secondary | ICD-10-CM

## 2020-04-08 NOTE — Progress Notes (Signed)
Subjective: [name] is a 59 y.o. male patient seen today in office for POV #4 (DOS 03-03-20), S/P right second metatarsal head removal and plantar wound debridement with application of graft (stravix) with repeat return to the OR for cauterization of bleeding vessels. Patient reports improvement with less drainage and bleeding from dorsal wound.  Patient denies nausea vomiting fever or chills or any other consitutional symptoms at this time. Completed PO antibotics last week.  No other pedal complaints noted.  Patient Active Problem List   Diagnosis Date Noted  . Amputated toe of right foot ([place]) 03/03/2020  . Bleeding 03/03/2020  . Acute postoperative anemia due to greater than expected blood loss 03/03/2020  . Osteomyelitis of ankle or foot, right, acute ([place]) 02/27/2020  . Clogged ear, left 01/22/2020  . Skin abnormality 01/22/2020  . Alcoholic peripheral neuropathy ([place]) 10/28/2019  . Chronic diastolic CHF (congestive heart failure) ([place]) 10/15/2019  . Chronic foot ulcer ([place]) 10/15/2019  . SIRS (systemic inflammatory response syndrome) ([place]) 10/15/2019  . Lymphedema 05/04/2019  . Alcoholic cirrhosis of liver without ascites ([place]) 04/27/2019  . Obesity hypoventilation syndrome ([place]) 04/27/2019  . Depression 04/25/2019  . Severe alcohol use disorder ([place])   . DOE (dyspnea on exertion) 04/22/2019  . Hypothyroidism 04/22/2019  . Anemia 02/06/2019  . Avascular necrosis of hip, left ([place]) 11/02/2018  . Decreased hearing of both ears 10/30/2018  . Avascular necrosis of hip, right ([place]) 08/16/2018  . Bilateral leg edema 02/27/2018  . Marijuana abuse 02/16/2018  . Ulcer of left heel ([place]) 12/21/2016  . Bilateral carpal tunnel syndrome 12/15/2016  . Fatty liver 12/14/2016  . Hyperlipidemia 11/25/2016  . Elevated liver enzymes 11/25/2016  . Prediabetes 11/25/2016  . Hypertension 11/23/2016  . Osteomyelitis due to type 2 diabetes mellitus ([place]) 11/23/2016  . Morbid obesity ([place])  11/23/2016  . OSA (obstructive sleep apnea) 11/23/2016  . Other hammer toe (acquired) 11/11/2013  . Exstrophy of bladder 11/11/2013    Current Outpatient Medications on File Prior to Visit  Medication Sig Dispense Refill  . cholecalciferol (VITAMIN D3) 25 MCG (1000 UNIT) tablet Take 1,000 Units by mouth daily.    Marland Kitchen docusate sodium (COLACE) 100 MG capsule Take 1 capsule (100 mg total) by mouth daily as needed. 30 capsule 2  . DULoxetine (CYMBALTA) 20 MG capsule Take 1 capsule (20 mg total) by mouth daily. 90 capsule 1  . furosemide (LASIX) 40 MG tablet TAKE 1 TABLET (40 MG TOTAL) BY MOUTH 2 (TWO) TIMES DAILY. 60 tablet 0  . levothyroxine (SYNTHROID) 50 MCG tablet Take 1 tablet (50 mcg total) by mouth daily. Need office visit for more refills. 30 tablet 0  . potassium chloride SA (KLOR-CON M20) 20 MEQ tablet TAKE 2 TABLETS (40 MEQ TOTAL) BY MOUTH 3 (THREE) TIMES DAILY. 180 tablet 1  . promethazine (PHENERGAN) 50 MG tablet Take 1 tablet (50 mg total) by mouth every 6 (six) hours as needed for nausea or vomiting. 30 tablet 0  . sulfamethoxazole-trimethoprim (BACTRIM DS) 800-160 MG tablet Take 1 tablet by mouth 2 (two) times daily. 28 tablet 0  . thiamine 100 MG tablet TAKE 1 TABLET BY MOUTH EVERY DAY (Patient taking differently: Take 100 mg by mouth daily. ) 100 tablet 0  . traZODone (DESYREL) 50 MG tablet TAKE 1-2 TABLETS (50-100 MG TOTAL) BY MOUTH AT BEDTIME AS NEEDED FOR SLEEP. 180 tablet 1  . TURMERIC PO Take 1 tablet by mouth daily.     . vitamin B-12 (CYANOCOBALAMIN) 100 MCG tablet  Take 100 mcg by mouth daily.     No current facility-administered medications on file prior to visit.    Allergies  Allergen Reactions  . Claritin [Loratadine] Shortness Of Breath and Anxiety    Objective: There were no vitals filed for this visit.  General: No acute distress, AAOx3  Right foot: Dorsal foot wound and plantar wound with hematoma present dorsally wound that measures 2.8 x 1.5 x 1.2 cm with  fibroganular tissue in woundbed, resolved hematoma in the wound bed, the plantar wound however is 100% granular with no evidence of hematoma with mild no macerated tissue that measures 2 x 0.5 cm appears to be getting smaller in size as compared to last visit, there is much improved malodor from the dorsal wound, minimal erythema, no warmth, + bloody drainage, improving in nature. No pain or crepitation with range of motion right foot or with palpation to wound.  No pain with calf compression.   Assessment and Plan:  Problem List Items Addressed This Visit    None    Visit Diagnoses    Ulcer of right foot with fat layer exposed (HCC)    -  Primary   Wound dehiscence       Hematoma       Resolved   Cellulitis of toe of right foot       Resolved   S/P foot surgery, right       Neuropathy         -Patient seen and evaluated -Discussed with patient plan for wound care -Using a sterile tissue nipper and pickup debrided  Dorsal wound on right to healthy bleeding margins, hematoma has resolved and hemostasis was achieved with manual pressure to the dorsal wound site.  Patient tolerated debridement procedure well without need for anesthesia.  Then dressed area with iodoform packing and adaptic plantar and dry sterile dressing to surgical site right foot secured with coban and stockinet  -Patient will benefit from Oasis micromatrix for the dorsal wound; request to be sent to [employer] for this wound care product to be used in conjunction with wound VAC when patient gets it -Home nurse to continue with packing dorsally and Adaptic plantarly of the right foot until he gets the wound VAC -Continue with physical therapy to tolerance -Advised patient to continue with CAM boot and to limit activity to prevent worsening of right foot wound -Advised patient to continue with rest and elevation like before -Return to office as scheduled on next week for continued wound care.  Asencion Islam, DPM

## 2020-04-09 ENCOUNTER — Other Ambulatory Visit: Payer: Self-pay | Admitting: Internal Medicine

## 2020-04-10 ENCOUNTER — Telehealth: Payer: Self-pay | Admitting: *Deleted

## 2020-04-10 NOTE — Telephone Encounter (Signed)
In office?

## 2020-04-10 NOTE — Telephone Encounter (Signed)
I spoke with [name] - [name] and he asked locale of the application of the MicroPowder and I told him I would find out. [name] states MicroPowder for wounds is not currently approved to be use in [facility] OR. [name] states he would like to check on the reimbursement for in-office, but needed to know for certain application locale.

## 2020-04-10 NOTE — Telephone Encounter (Signed)
-----   Message from Asencion Islam, [place] sent at 04/08/2020  9:17 PM EDT ----- Regarding: Oasis Mirco powder for wound Contact Mark from [employer] to get this for patient's dorsal wound dehiscence site which measures 2.8x1.5x1.2 on right foot

## 2020-04-11 ENCOUNTER — Telehealth: Payer: Self-pay | Admitting: *Deleted

## 2020-04-11 NOTE — Telephone Encounter (Signed)
Faxed required IVR form, clinicals and demographics to [employer], and emailed to [place]. Bascom Levels.

## 2020-04-11 NOTE — Telephone Encounter (Signed)
Emailed location MicroPowder would be used and [employer].

## 2020-04-11 NOTE — Telephone Encounter (Addendum)
-----   Message from Asencion Islam, [place] sent at 04/10/2020  7:42 PM EDT ----- Regarding: Oasis change to [employer] Texted me and said that oasis cant be covered for in office use. Lets put in a IVR for grafix instead to see if we can get this approved for patient -Dr. Rosaland Lao from [employer] to get this for patient's dorsal wound dehiscence site which measures 2.8x1.5x1.2 on right foot

## 2020-04-14 ENCOUNTER — Telehealth: Payer: Self-pay | Admitting: Sports Medicine

## 2020-04-14 NOTE — Telephone Encounter (Signed)
Left message informing pt that at the very least bring the pump with the plug, the canister reservoir, and pads and film, but if he felt he should bring the entire box that would be fine.

## 2020-04-14 NOTE — Telephone Encounter (Signed)
Pt has wound vac delivery but not sure what to bring from the big box it came in he states it looks like a lot of duplicate items he just wanted to know what to bring please assist

## 2020-04-15 ENCOUNTER — Other Ambulatory Visit: Payer: Self-pay

## 2020-04-15 ENCOUNTER — Telehealth: Payer: Self-pay | Admitting: *Deleted

## 2020-04-15 ENCOUNTER — Encounter: Payer: Self-pay | Admitting: Sports Medicine

## 2020-04-15 ENCOUNTER — Ambulatory Visit (INDEPENDENT_AMBULATORY_CARE_PROVIDER_SITE_OTHER): Payer: Medicare Other | Admitting: Sports Medicine

## 2020-04-15 VITALS — Temp 98.0°F

## 2020-04-15 DIAGNOSIS — T148XXA Other injury of unspecified body region, initial encounter: Secondary | ICD-10-CM

## 2020-04-15 DIAGNOSIS — G629 Polyneuropathy, unspecified: Secondary | ICD-10-CM | POA: Diagnosis not present

## 2020-04-15 DIAGNOSIS — Z9889 Other specified postprocedural states: Secondary | ICD-10-CM

## 2020-04-15 DIAGNOSIS — T8130XA Disruption of wound, unspecified, initial encounter: Secondary | ICD-10-CM | POA: Diagnosis not present

## 2020-04-15 DIAGNOSIS — L97512 Non-pressure chronic ulcer of other part of right foot with fat layer exposed: Secondary | ICD-10-CM

## 2020-04-15 DIAGNOSIS — L03031 Cellulitis of right toe: Secondary | ICD-10-CM | POA: Diagnosis not present

## 2020-04-15 NOTE — Telephone Encounter (Signed)
[name] - [employer] will be present for the application of SnapVac.

## 2020-04-15 NOTE — Progress Notes (Signed)
Subjective: [name] is a 59 y.o. male patient seen today in office for POV #5 (DOS 03-03-20), S/P right second metatarsal head removal and plantar wound debridement with application of graft (stravix) with repeat return to the OR for cauterization of bleeding vessels. Patient reports that his wound VAC has been ordered and he has brought his supplies as well as he has been feeling like the dorsal wound has been healing up and doing much better less drainage no odor decreased redness swelling and warmth to the dorsal foot.  Patient denies nausea vomiting fever chills or any increase in pain or symptoms at this time.  No other pedal complaints noted.  Patient Active Problem List   Diagnosis Date Noted  . Amputated toe of right foot (HCC) 03/03/2020  . Bleeding 03/03/2020  . Acute postoperative anemia due to greater than expected blood loss 03/03/2020  . Osteomyelitis of ankle or foot, right, acute (HCC) 02/27/2020  . Clogged ear, left 01/22/2020  . Skin abnormality 01/22/2020  . Alcoholic peripheral neuropathy (HCC) 10/28/2019  . Chronic diastolic CHF (congestive heart failure) (HCC) 10/15/2019  . Chronic foot ulcer (HCC) 10/15/2019  . SIRS (systemic inflammatory response syndrome) (HCC) 10/15/2019  . Lymphedema 05/04/2019  . Alcoholic cirrhosis of liver without ascites (HCC) 04/27/2019  . Obesity hypoventilation syndrome (HCC) 04/27/2019  . Depression 04/25/2019  . Severe alcohol use disorder (HCC)   . DOE (dyspnea on exertion) 04/22/2019  . Hypothyroidism 04/22/2019  . Anemia 02/06/2019  . Avascular necrosis of hip, left (HCC) 11/02/2018  . Decreased hearing of both ears 10/30/2018  . Avascular necrosis of hip, right (HCC) 08/16/2018  . Bilateral leg edema 02/27/2018  . Marijuana abuse 02/16/2018  . Ulcer of left heel (HCC) 12/21/2016  . Bilateral carpal tunnel syndrome 12/15/2016  . Fatty liver 12/14/2016  . Hyperlipidemia 11/25/2016  . Elevated liver enzymes 11/25/2016  .  Prediabetes 11/25/2016  . Hypertension 11/23/2016  . Osteomyelitis due to type 2 diabetes mellitus (HCC) 11/23/2016  . Morbid obesity (HCC) 11/23/2016  . OSA (obstructive sleep apnea) 11/23/2016  . Other hammer toe (acquired) 11/11/2013  . Exstrophy of bladder 11/11/2013    Current Outpatient Medications on File Prior to Visit  Medication Sig Dispense Refill  . cholecalciferol (VITAMIN D3) 25 MCG (1000 UNIT) tablet Take 1,000 Units by mouth daily.    Marland Kitchen docusate sodium (COLACE) 100 MG capsule Take 1 capsule (100 mg total) by mouth daily as needed. 30 capsule 2  . DULoxetine (CYMBALTA) 20 MG capsule Take 1 capsule (20 mg total) by mouth daily. 90 capsule 1  . furosemide (LASIX) 40 MG tablet TAKE 1 TABLET (40 MG TOTAL) BY MOUTH 2 (TWO) TIMES DAILY. 60 tablet 0  . levothyroxine (SYNTHROID) 50 MCG tablet TAKE 1 TABLET (50 MCG TOTAL) BY MOUTH DAILY. NEED OFFICE VISIT FOR MORE REFILLS. 90 tablet 1  . potassium chloride SA (KLOR-CON M20) 20 MEQ tablet TAKE 2 TABLETS (40 MEQ TOTAL) BY MOUTH 3 (THREE) TIMES DAILY. 180 tablet 1  . promethazine (PHENERGAN) 50 MG tablet Take 1 tablet (50 mg total) by mouth every 6 (six) hours as needed for nausea or vomiting. 30 tablet 0  . thiamine 100 MG tablet TAKE 1 TABLET BY MOUTH EVERY DAY (Patient taking differently: Take 100 mg by mouth daily. ) 100 tablet 0  . traZODone (DESYREL) 50 MG tablet TAKE 1-2 TABLETS (50-100 MG TOTAL) BY MOUTH AT BEDTIME AS NEEDED FOR SLEEP. 180 tablet 1  . TURMERIC PO Take 1 tablet by mouth  daily.     . vitamin B-12 (CYANOCOBALAMIN) 100 MCG tablet Take 100 mcg by mouth daily.     No current facility-administered medications on file prior to visit.    Allergies  Allergen Reactions  . Claritin [Loratadine] Shortness Of Breath and Anxiety    Objective: There were no vitals filed for this visit.  General: No acute distress, AAOx3  Right foot: Dorsal foot wound at area of wound dehiscence and previous hematoma measures 2.5 x 1.5 x  2 cm probes to plantar wound with fibroganular tissue in woundbed, resolved hematoma in the wound bed, the plantar wound however is 100% granular with no evidence of hematoma with mild no macerated tissue that measures 2 x 0.3 cm appears to be getting smaller in size as compared to last visit, there is much improved malodor from the dorsal wound, minimal blanchable erythema, no warmth, + bloody drainage, + focal edema to second toe improving in nature. No pain or crepitation with range of motion right foot or with palpation to wound.  No pain with calf compression.   Assessment and Plan:  Problem List Items Addressed This Visit    None    Visit Diagnoses    Wound dehiscence    -  Primary   Ulcer of right foot with fat layer exposed (HCC)       Hematoma       Cellulitis of toe of right foot       S/P foot surgery, right       Neuropathy         -Patient seen and evaluated -Discussed with patient plan for wound care -Using a sterile tissue nipper mechanically debrided dorsal wound bed to healthy bleeding hemostasis was achieved with manual pressure no need for anesthetic for debridement procedure then applied Grafix PL 3 x 4 cm graft lot number [id number] serial #[id number] expiration March 20 623 part number [id number] use entire graft at right dorsal wound without waste secured with Adaptic and Steri-Strips and then dorsally applied wound VAC with black granular sponge covered with wound VAC dressing paper with seal intact device was turned on in operating at 125 mmHg continuously patient to keep dorsal wound VAC intact and to allow nursing to change as instructed to the plantar wound on the right foot applied Adaptic and Steri-Strips and dry dressing all secured with Ace wrap -Patient tolerated debridement procedure and application of graft and wound VAC well in office without need for anesthesia -Continue with home nursing with instructions as above -Continue with physical therapy to  tolerance -Advised patient to continue with CAM boot and to limit activity to prevent worsening of right foot wound however patient comes today in office and postoperative shoe -Advised patient to continue with rest and elevation like before to assist with pain swelling and edema control -Return to office as scheduled on next week for continued wound care/wound VAC dressing change at the dorsal aspect of the right foot.  Asencion Islam, DPM

## 2020-04-16 ENCOUNTER — Telehealth: Payer: Self-pay | Admitting: Sports Medicine

## 2020-04-16 NOTE — Telephone Encounter (Signed)
Patient called my personal cell and informed me that his wound VAC seal was broken and that the machine was alarming so he has unplugged the machine and turned it off.  Advised patient if he is able to to remove the [facility] paper and sponge and put a dry dressing on the foot but to be very careful not to disturb the graft site and to place down the clear/Adaptic layer VAC over the wound bed to prevent disturbing of the graft.  Office will reach out to [facility] to see if they can come out any sooner than Friday to reassess his foot since his wound VAC has malfunction to see if they can replace it with proper seal. -Dr. Marylene Land

## 2020-04-17 ENCOUNTER — Telehealth: Payer: Self-pay | Admitting: Sports Medicine

## 2020-04-17 NOTE — Telephone Encounter (Signed)
I spoke with [facility] - Lauren and informed of pt's need for earlier [facility] nurse visit than 04/18/2020 and she states fax orders to [phone] and they will get go on the orders.

## 2020-04-17 NOTE — Telephone Encounter (Signed)
-----   Message from Asencion Islam, [place] sent at 04/15/2020 10:34 PM EDT ----- Regarding: Home nursing orders Apply wound vac to dorsal wound, patient had a graft placed dorsally if Adaptic and Steri-Strips come off please were placed at the dorsal wound before applying the wound VAC sponge to the wound bed.  Wound VAC is to be changed on Thursdays and Saturdays and patient will follow-up in office on Tuesday for continued wound VAC dressing care.  On the plantar foot apply Adaptic and gauze dressing to protect previous plantar wound graft site that is now well incorporated

## 2020-04-17 NOTE — Telephone Encounter (Signed)
Community Message to [employer] that pt was established with [facility].

## 2020-04-17 NOTE — Telephone Encounter (Signed)
Faxed required form, SnapShot with clinicals and demographics, insurance Medicare to [facility].

## 2020-04-17 NOTE — Telephone Encounter (Signed)
Faxed orders to [facility]. 

## 2020-04-17 NOTE — Telephone Encounter (Signed)
Tiffany from [facility] stated that the pt is open to [facility] and can the orders be updated

## 2020-04-18 ENCOUNTER — Other Ambulatory Visit: Payer: Self-pay | Admitting: Internal Medicine

## 2020-04-23 ENCOUNTER — Ambulatory Visit (INDEPENDENT_AMBULATORY_CARE_PROVIDER_SITE_OTHER): Payer: Medicare Other | Admitting: Podiatry

## 2020-04-23 ENCOUNTER — Other Ambulatory Visit: Payer: Self-pay

## 2020-04-23 DIAGNOSIS — T8130XA Disruption of wound, unspecified, initial encounter: Secondary | ICD-10-CM

## 2020-04-23 DIAGNOSIS — L97512 Non-pressure chronic ulcer of other part of right foot with fat layer exposed: Secondary | ICD-10-CM

## 2020-04-24 ENCOUNTER — Encounter: Payer: Self-pay | Admitting: Podiatry

## 2020-04-24 NOTE — Progress Notes (Signed)
Subjective:  Patient ID: [name], male    DOB: Apr 28, 1961,  MRN: [id number]  Chief Complaint  Patient presents with  . Foot Pain     POV #7 DOS 03/03/20 METATARSAL HEAD RES 2ND RT, WOUND DEBRIDMENT W/GRAFT RT      59 y.o. male returns for post-op check.  Patient presents with a follow-up of listed above procedure without wound dehiscence and wound formation.  Patient has been doing wound VAC therapy to the area however patient had to remove the wound VAC as it got malfunction yesterday.  No clinical signs of infection noted.  He does not like the wound VAC.  He wanted to know if there is any other treatment options.  He is known to Dr. Marylene Land who performed the surgery.  He denies any other acute complaints.  There is been a little maceration present.  Review of Systems: Negative except as noted in the HPI. Denies N/V/F/Ch.  Past Medical History:  Diagnosis Date  . Alcohol dependence (HCC)   . Arthritis    hips, hands  . Bilateral carpal tunnel syndrome   . Bilateral leg edema    Chronic  . Diabetes mellitus without complication (HCC)    type 2  . Diverticulitis    portion of colon removed  . DOE (dyspnea on exertion)    occ  . Elevated liver enzymes   . Fatty liver   . GERD (gastroesophageal reflux disease)    occ  . Hammer toe   . Hip pain   . History of ventral hernia repair 2016   x2  . Hyperlipidemia    pt unsure  . Hypertension   . Marijuana abuse   . Morbid obesity (HCC)   . Neuromuscular disorder (HCC)    peripheral neuropathy feet and few fingers  . OSA (obstructive sleep apnea)    has OSA-not used CPAP 2-3 yrs could not tolerate cpap  . PONV (postoperative nausea and vomiting)   . Toe ulcer (HCC)    left healed    Current Outpatient Medications:  .  cholecalciferol (VITAMIN D3) 25 MCG (1000 UNIT) tablet, Take 1,000 Units by mouth daily., Disp: , Rfl:  .  docusate sodium (COLACE) 100 MG capsule, Take 1 capsule (100 mg total) by mouth daily as needed.,  Disp: 30 capsule, Rfl: 2 .  DULoxetine (CYMBALTA) 20 MG capsule, Take 1 capsule (20 mg total) by mouth daily., Disp: 90 capsule, Rfl: 1 .  furosemide (LASIX) 40 MG tablet, TAKE 1 TABLET (40 MG TOTAL) BY MOUTH 2 (TWO) TIMES DAILY., Disp: 60 tablet, Rfl: 0 .  levothyroxine (SYNTHROID) 50 MCG tablet, TAKE 1 TABLET (50 MCG TOTAL) BY MOUTH DAILY. NEED OFFICE VISIT FOR MORE REFILLS., Disp: 90 tablet, Rfl: 1 .  potassium chloride SA (KLOR-CON M20) 20 MEQ tablet, TAKE 2 TABLETS BY MOUTH 3 TIMES A DAY, Disp: 180 tablet, Rfl: 1 .  promethazine (PHENERGAN) 50 MG tablet, Take 1 tablet (50 mg total) by mouth every 6 (six) hours as needed for nausea or vomiting., Disp: 30 tablet, Rfl: 0 .  thiamine 100 MG tablet, TAKE 1 TABLET BY MOUTH EVERY DAY (Patient taking differently: Take 100 mg by mouth daily. ), Disp: 100 tablet, Rfl: 0 .  traZODone (DESYREL) 50 MG tablet, TAKE 1-2 TABLETS (50-100 MG TOTAL) BY MOUTH AT BEDTIME AS NEEDED FOR SLEEP., Disp: 180 tablet, Rfl: 1 .  TURMERIC PO, Take 1 tablet by mouth daily. , Disp: , Rfl:  .  vitamin B-12 (CYANOCOBALAMIN) 100 MCG  tablet, Take 100 mcg by mouth daily., Disp: , Rfl:   Social History   Tobacco Use  Smoking Status Former Smoker  . Packs/day: 0.25  . Years: 10.00  . Pack years: 2.50  . Types: Cigarettes  Smokeless Tobacco Never Used  Tobacco Comment   quit 2018    Allergies  Allergen Reactions  . Claritin [Loratadine] Shortness Of Breath and Anxiety   Objective:  There were no vitals filed for this visit. There is no height or weight on file to calculate BMI. Constitutional Well developed. Well nourished.  Vascular Foot warm and well perfused. Capillary refill normal to all digits.   Neurologic Normal speech. Oriented to person, place, and time. Epicritic sensation to light touch grossly present bilaterally.  Dermatologic  right dorsal foot wound secondary to dehiscence from previous surgery.  No clinical signs of infection noted.  Maceration  present.  No malodor present no purulent drainage noted.  Orthopedic: Tenderness to palpation noted about the surgical site.   Radiographs: None Assessment:   1. Wound dehiscence   2. Ulcer of right foot with fat layer exposed (HCC)    Plan:  Patient was evaluated and treated and all questions answered.  S/p foot surgery right -Progressing as expected post-operatively. -XR: None -WB Status: Weightbearing as tolerated in cam boot -Betadine wet-to-dry dressing was applied secondary to maceration.  I believe patient will benefit from [facility] holiday.  At the nursing will reapply the [facility] back on Friday.  Continue Betadine very wet-to-dry dressing changes daily until then.  Patient states understanding  No follow-ups on file.

## 2020-04-28 ENCOUNTER — Telehealth: Payer: Self-pay

## 2020-04-28 MED ORDER — DULOXETINE HCL 20 MG PO CPEP: 20.0000 mg | ORAL_CAPSULE | Freq: Every day | ORAL | 1 refills | Status: DC

## 2020-04-28 NOTE — Telephone Encounter (Signed)
1.Medication Requested:DULoxetine (CYMBALTA) 20 MG capsule  2. Pharmacy (Name, Street, City):[id number] , [place] - [address]  3. On Med List: Yes   4. Last Visit with PCP: 5.10.21  5. Next visit date with PCP: n/a   Agent: Please be advised that RX refills may take up to 3 business days. We ask that you follow-up with your pharmacy.

## 2020-05-06 ENCOUNTER — Telehealth: Payer: Self-pay | Admitting: *Deleted

## 2020-05-06 ENCOUNTER — Ambulatory Visit (INDEPENDENT_AMBULATORY_CARE_PROVIDER_SITE_OTHER): Payer: Medicare Other | Admitting: Sports Medicine

## 2020-05-06 ENCOUNTER — Other Ambulatory Visit: Payer: Self-pay

## 2020-05-06 ENCOUNTER — Encounter: Payer: Self-pay | Admitting: Sports Medicine

## 2020-05-06 DIAGNOSIS — G629 Polyneuropathy, unspecified: Secondary | ICD-10-CM | POA: Diagnosis not present

## 2020-05-06 DIAGNOSIS — L97512 Non-pressure chronic ulcer of other part of right foot with fat layer exposed: Secondary | ICD-10-CM

## 2020-05-06 DIAGNOSIS — Z9889 Other specified postprocedural states: Secondary | ICD-10-CM

## 2020-05-06 DIAGNOSIS — T8130XA Disruption of wound, unspecified, initial encounter: Secondary | ICD-10-CM

## 2020-05-06 DIAGNOSIS — T148XXA Other injury of unspecified body region, initial encounter: Secondary | ICD-10-CM

## 2020-05-06 NOTE — Telephone Encounter (Signed)
Faxed copy of Dr. Wynema Birch orders to [facility] and emailed instructions to [facility] and left message.

## 2020-05-06 NOTE — Progress Notes (Signed)
Subjective: [name] is a 59 y.o. male patient seen today in office for POV #7 (DOS 03-03-20), S/P right second metatarsal head removal and plantar wound debridement with application of graft (stravix) with repeat return to the OR for cauterization of bleeding vessels and application of graft in office 3 weeks ago. Patient reports that his wound VAC was not holding a seal so they have stopped it and his nurse has been helping him with applying some dry dressing to the area.  Patient reports that the bottom is looking good and has healed up as well as the top is doing much better.  No other pedal complaints noted.  Patient Active Problem List   Diagnosis Date Noted  . Amputated toe of right foot ([place]) 03/03/2020  . Bleeding 03/03/2020  . Acute postoperative anemia due to greater than expected blood loss 03/03/2020  . Osteomyelitis of ankle or foot, right, acute ([place]) 02/27/2020  . Clogged ear, left 01/22/2020  . Skin abnormality 01/22/2020  . Alcoholic peripheral neuropathy ([place]) 10/28/2019  . Chronic diastolic CHF (congestive heart failure) ([place]) 10/15/2019  . Chronic foot ulcer ([place]) 10/15/2019  . SIRS (systemic inflammatory response syndrome) ([place]) 10/15/2019  . Lymphedema 05/04/2019  . Alcoholic cirrhosis of liver without ascites ([place]) 04/27/2019  . Obesity hypoventilation syndrome ([place]) 04/27/2019  . Depression 04/25/2019  . Severe alcohol use disorder ([place])   . DOE (dyspnea on exertion) 04/22/2019  . Hypothyroidism 04/22/2019  . Anemia 02/06/2019  . Avascular necrosis of hip, left ([place]) 11/02/2018  . Decreased hearing of both ears 10/30/2018  . Avascular necrosis of hip, right ([place]) 08/16/2018  . Bilateral leg edema 02/27/2018  . Marijuana abuse 02/16/2018  . Ulcer of left heel ([place]) 12/21/2016  . Bilateral carpal tunnel syndrome 12/15/2016  . Fatty liver 12/14/2016  . Hyperlipidemia 11/25/2016  . Elevated liver enzymes 11/25/2016  . Prediabetes 11/25/2016  . Hypertension  11/23/2016  . Osteomyelitis due to type 2 diabetes mellitus ([place]) 11/23/2016  . Morbid obesity ([place]) 11/23/2016  . OSA (obstructive sleep apnea) 11/23/2016  . Other hammer toe (acquired) 11/11/2013  . Exstrophy of bladder 11/11/2013    Current Outpatient Medications on File Prior to Visit  Medication Sig Dispense Refill  . cholecalciferol (VITAMIN D3) 25 MCG (1000 UNIT) tablet Take 1,000 Units by mouth daily.    Marland Kitchen docusate sodium (COLACE) 100 MG capsule Take 1 capsule (100 mg total) by mouth daily as needed. 30 capsule 2  . DULoxetine (CYMBALTA) 20 MG capsule Take 1 capsule (20 mg total) by mouth daily. 90 capsule 1  . furosemide (LASIX) 40 MG tablet TAKE 1 TABLET (40 MG TOTAL) BY MOUTH 2 (TWO) TIMES DAILY. 60 tablet 0  . levothyroxine (SYNTHROID) 50 MCG tablet TAKE 1 TABLET (50 MCG TOTAL) BY MOUTH DAILY. NEED OFFICE VISIT FOR MORE REFILLS. 90 tablet 1  . potassium chloride SA (KLOR-CON M20) 20 MEQ tablet TAKE 2 TABLETS BY MOUTH 3 TIMES A DAY 180 tablet 1  . promethazine (PHENERGAN) 50 MG tablet Take 1 tablet (50 mg total) by mouth every 6 (six) hours as needed for nausea or vomiting. 30 tablet 0  . thiamine 100 MG tablet TAKE 1 TABLET BY MOUTH EVERY DAY (Patient taking differently: Take 100 mg by mouth daily. ) 100 tablet 0  . traZODone (DESYREL) 50 MG tablet TAKE 1-2 TABLETS (50-100 MG TOTAL) BY MOUTH AT BEDTIME AS NEEDED FOR SLEEP. 180 tablet 1  . TURMERIC PO Take 1 tablet by mouth daily.     Marland Kitchen  vitamin B-12 (CYANOCOBALAMIN) 100 MCG tablet Take 100 mcg by mouth daily.     No current facility-administered medications on file prior to visit.    Allergies  Allergen Reactions  . Claritin [Loratadine] Shortness Of Breath and Anxiety    Objective: There were no vitals filed for this visit.  General: No acute distress, AAOx3  Right foot: Dorsal foot wound at area of wound dehiscence and previous hematoma measures 1.5 x 0.6 x 0.2 cm with fibroganular tissue in woundbed, resolved hematoma  and resolve depth in the wound bed, previous plantar wound has healed over, no pain or crepitation with range of motion right foot or with palpation to wound.  No pain with calf compression.   Assessment and Plan:  Problem List Items Addressed This Visit    None    Visit Diagnoses    Ulcer of right foot with fat layer exposed ([place])    -  Primary   Wound dehiscence       Hematoma       S/P foot surgery, right       Neuropathy         -Patient seen and evaluated -Discussed with patient plan for wound care -Using a sterile dermal curette mechanically debrided dorsal wound bed to healthy bleeding hemostasis was achieved with manual pressure no need for anesthetic for debridement procedure then applied Grafix PL 3 x 4 cm graft lot number [id number] serial #[id number] expiration January 10 2022 part number [id number] used entire graft at right dorsal wound without waste secured with Adaptic and Steri-Strips and then dry dressing and to allow nursing to change using dry dressing to the right foot 1-2 times weekly with care not to disturb the graft site for Steri-Strips -Patient tolerated debridement procedure and application of graft  in office without need for anesthesia -Orders to be sent to [facility] to return wound VAC since we have discontinued it since patient has met goal of therapy -Continue with home nursing with instructions as above -Continue with physical therapy to tolerance like previous with limited ambulation with postoperative shoe -Advised patient to continue with rest and elevation like before to assist with pain swelling and edema control -Return to office as scheduled in 2 weeks for continued wound care/possible regraft at dorsal aspect of the right foot.  Landis Martins, DPM

## 2020-05-06 NOTE — Telephone Encounter (Signed)
-----   Message from Asencion Islam, [place] sent at 05/06/2020 12:38 PM EDT ----- Regarding: Home nursing orders and return wound VAC Please contact Shanesia from [facility] to return wound VAC please have her to call patient to give him further instruction on how to return the wound VAC wound VAC discontinued today due to completion of goals of therapy  Home nursing to apply dry dressing to the right foot once weekly.  Patient had graft placed at today's office visit and must keep the Steri-Strips and Adaptic in place over the graft site

## 2020-05-08 ENCOUNTER — Telehealth: Payer: Self-pay | Admitting: Pulmonary Disease

## 2020-05-08 NOTE — Telephone Encounter (Signed)
Patient does not want a consult he was unable to tolerate CPAP in past. Nothing further is needed.

## 2020-05-09 ENCOUNTER — Institutional Professional Consult (permissible substitution): Payer: Medicare Other | Admitting: Pulmonary Disease

## 2020-05-12 ENCOUNTER — Telehealth: Payer: Self-pay | Admitting: Sports Medicine

## 2020-05-12 NOTE — Telephone Encounter (Signed)
Val Will you call Tresa Endo with [facility] PT and give verbal orders to continue PT for Mr. Bagshaw Thanks Dr. Marylene Land

## 2020-05-12 NOTE — Telephone Encounter (Signed)
[name] with [facility] needs verbal orders to continue therapy

## 2020-05-13 NOTE — Telephone Encounter (Signed)
Left message informing [facility] of Dr. Wynema Birch 05/12/2020 6:19pm orders to continue PT.

## 2020-05-19 ENCOUNTER — Other Ambulatory Visit: Payer: Self-pay

## 2020-05-19 NOTE — Telephone Encounter (Signed)
New message   Asking the CMA to call him back   1.Medication Requested:furosemide (LASIX) 40 MG tablet  2. Pharmacy (Name, Street, City):[id number] - [place], [place] - [address]  3. On Med List: yes   4. Last Visit with PCP: 5.10.21  5. Next visit date with PCP: n/a   Agent: Please be advised that RX refills may take up to 3 business days. We ask that you follow-up with your pharmacy.

## 2020-05-20 ENCOUNTER — Encounter: Payer: Self-pay | Admitting: Internal Medicine

## 2020-05-21 NOTE — Telephone Encounter (Signed)
Left message for patient to return call to clinic.

## 2020-05-22 ENCOUNTER — Other Ambulatory Visit: Payer: Self-pay

## 2020-05-22 ENCOUNTER — Encounter: Payer: Self-pay | Admitting: Sports Medicine

## 2020-05-22 ENCOUNTER — Ambulatory Visit (INDEPENDENT_AMBULATORY_CARE_PROVIDER_SITE_OTHER): Payer: Medicare Other | Admitting: Sports Medicine

## 2020-05-22 DIAGNOSIS — Z9889 Other specified postprocedural states: Secondary | ICD-10-CM

## 2020-05-22 DIAGNOSIS — L97512 Non-pressure chronic ulcer of other part of right foot with fat layer exposed: Secondary | ICD-10-CM | POA: Diagnosis not present

## 2020-05-22 DIAGNOSIS — T8130XA Disruption of wound, unspecified, initial encounter: Secondary | ICD-10-CM

## 2020-05-22 DIAGNOSIS — G629 Polyneuropathy, unspecified: Secondary | ICD-10-CM

## 2020-05-22 NOTE — Patient Instructions (Signed)
May weightbear with PT  Wound care: on left apply medihoney and dry dressing daily To left apply oasis matrix and guaze or packing over the top and dry dressing daily

## 2020-05-22 NOTE — Progress Notes (Signed)
Subjective: [name] is a 59 y.o. male patient seen today in office for POV #8 (DOS 03-03-20), S/P right second metatarsal head removal and plantar wound debridement with application of graft (stravix) with repeat return to the OR for cauterization of bleeding vessels and application of graft in office 2 weeks ago. Patient reports that he is doing good but home nurse wanted me to check his left foot.  No other pedal complaints noted.  Patient Active Problem List   Diagnosis Date Noted  . Amputated toe of right foot (HCC) 03/03/2020  . Bleeding 03/03/2020  . Acute postoperative anemia due to greater than expected blood loss 03/03/2020  . Osteomyelitis of ankle or foot, right, acute (HCC) 02/27/2020  . Clogged ear, left 01/22/2020  . Skin abnormality 01/22/2020  . Alcoholic peripheral neuropathy (HCC) 10/28/2019  . Chronic diastolic CHF (congestive heart failure) (HCC) 10/15/2019  . Chronic foot ulcer (HCC) 10/15/2019  . SIRS (systemic inflammatory response syndrome) (HCC) 10/15/2019  . Lymphedema 05/04/2019  . Alcoholic cirrhosis of liver without ascites (HCC) 04/27/2019  . Obesity hypoventilation syndrome (HCC) 04/27/2019  . Depression 04/25/2019  . Severe alcohol use disorder (HCC)   . DOE (dyspnea on exertion) 04/22/2019  . Hypothyroidism 04/22/2019  . Anemia 02/06/2019  . Avascular necrosis of hip, left (HCC) 11/02/2018  . Decreased hearing of both ears 10/30/2018  . Avascular necrosis of hip, right (HCC) 08/16/2018  . Bilateral leg edema 02/27/2018  . Marijuana abuse 02/16/2018  . Ulcer of left heel (HCC) 12/21/2016  . Bilateral carpal tunnel syndrome 12/15/2016  . Fatty liver 12/14/2016  . Hyperlipidemia 11/25/2016  . Elevated liver enzymes 11/25/2016  . Prediabetes 11/25/2016  . Hypertension 11/23/2016  . Osteomyelitis due to type 2 diabetes mellitus (HCC) 11/23/2016  . Morbid obesity (HCC) 11/23/2016  . OSA (obstructive sleep apnea) 11/23/2016  . Other hammer toe  (acquired) 11/11/2013  . Exstrophy of bladder 11/11/2013    Current Outpatient Medications on File Prior to Visit  Medication Sig Dispense Refill  . cholecalciferol (VITAMIN D3) 25 MCG (1000 UNIT) tablet Take 1,000 Units by mouth daily.    Marland Kitchen docusate sodium (COLACE) 100 MG capsule Take 1 capsule (100 mg total) by mouth daily as needed. 30 capsule 2  . DULoxetine (CYMBALTA) 20 MG capsule Take 1 capsule (20 mg total) by mouth daily. 90 capsule 1  . furosemide (LASIX) 40 MG tablet TAKE 1 TABLET (40 MG TOTAL) BY MOUTH 2 (TWO) TIMES DAILY. 60 tablet 0  . levothyroxine (SYNTHROID) 50 MCG tablet TAKE 1 TABLET (50 MCG TOTAL) BY MOUTH DAILY. NEED OFFICE VISIT FOR MORE REFILLS. 90 tablet 1  . potassium chloride SA (KLOR-CON M20) 20 MEQ tablet TAKE 2 TABLETS BY MOUTH 3 TIMES A DAY 180 tablet 1  . promethazine (PHENERGAN) 50 MG tablet Take 1 tablet (50 mg total) by mouth every 6 (six) hours as needed for nausea or vomiting. 30 tablet 0  . thiamine 100 MG tablet TAKE 1 TABLET BY MOUTH EVERY DAY (Patient taking differently: Take 100 mg by mouth daily. ) 100 tablet 0  . traZODone (DESYREL) 50 MG tablet TAKE 1-2 TABLETS (50-100 MG TOTAL) BY MOUTH AT BEDTIME AS NEEDED FOR SLEEP. 180 tablet 1  . TURMERIC PO Take 1 tablet by mouth daily.     . vitamin B-12 (CYANOCOBALAMIN) 100 MCG tablet Take 100 mcg by mouth daily.     No current facility-administered medications on file prior to visit.    Allergies  Allergen Reactions  . Claritin [  Loratadine] Shortness Of Breath and Anxiety    Objective: There were no vitals filed for this visit.  General: No acute distress, AAOx3  Right foot: Dorsal foot wound at area of wound dehiscence and previous hematoma measures 0.8 x 0.5 x 0.3 cm with fibroganular tissue in woundbed, decreased depth in the wound bed, previous plantar wound has healed over, remains a scab, no pain or crepitation with range of motion right foot or with palpation to wound.  No pain with calf  compression.   Left foot: Plantar lateral foot ulcer with keratotic margins and granular base measures 1x0.8cm no drainage or malodor, no redness, no warmth, no other signs of infection. No pain to ulcerated area.. + charcot.  Assessment and Plan:  Problem List Items Addressed This Visit    None    Visit Diagnoses    Ulcer of right foot with fat layer exposed (HCC)    -  Primary   Wound dehiscence       S/P foot surgery, right       Neuropathy         -Patient seen and evaluated -Discussed with patient plan for wound care -Using a sterile dermal curette mechanically debrided dorsal wound bed and left plantar ulcer to healthy bleeding hemostasis was achieved with manual pressure no need for anesthetic for debridement procedure then applied medihoney and meplix boarder to left and oasis and wet to dry bolster guaze to the right secured with dry dressing and coban -Patient tolerated debridement procedure and dressing application without need for anesthesia  -Continue with home nursing with instructions as above -Continue with physical therapy to tolerance like previous, may now weightbear  -Advised patient to continue with rest and elevation like before to assist with pain swelling and edema control -Return to office as scheduled in 2-3 weeks for continued wound care.   Asencion Islam, DPM

## 2020-05-25 ENCOUNTER — Other Ambulatory Visit: Payer: Self-pay | Admitting: Internal Medicine

## 2020-05-28 ENCOUNTER — Encounter (HOSPITAL_COMMUNITY): Payer: Self-pay

## 2020-05-28 ENCOUNTER — Other Ambulatory Visit: Payer: Self-pay

## 2020-05-28 DIAGNOSIS — E1159 Type 2 diabetes mellitus with other circulatory complications: Secondary | ICD-10-CM | POA: Diagnosis not present

## 2020-05-28 DIAGNOSIS — R0602 Shortness of breath: Secondary | ICD-10-CM | POA: Insufficient documentation

## 2020-05-28 DIAGNOSIS — I5032 Chronic diastolic (congestive) heart failure: Secondary | ICD-10-CM | POA: Diagnosis not present

## 2020-05-28 DIAGNOSIS — F1020 Alcohol dependence, uncomplicated: Secondary | ICD-10-CM | POA: Insufficient documentation

## 2020-05-28 DIAGNOSIS — R072 Precordial pain: Secondary | ICD-10-CM | POA: Insufficient documentation

## 2020-05-28 DIAGNOSIS — R002 Palpitations: Secondary | ICD-10-CM | POA: Diagnosis not present

## 2020-05-28 DIAGNOSIS — F1210 Cannabis abuse, uncomplicated: Secondary | ICD-10-CM | POA: Insufficient documentation

## 2020-05-28 DIAGNOSIS — Z87891 Personal history of nicotine dependence: Secondary | ICD-10-CM | POA: Insufficient documentation

## 2020-05-28 DIAGNOSIS — E039 Hypothyroidism, unspecified: Secondary | ICD-10-CM | POA: Diagnosis not present

## 2020-05-28 DIAGNOSIS — Z20822 Contact with and (suspected) exposure to covid-19: Secondary | ICD-10-CM | POA: Diagnosis not present

## 2020-05-28 DIAGNOSIS — I110 Hypertensive heart disease with heart failure: Secondary | ICD-10-CM | POA: Insufficient documentation

## 2020-05-28 NOTE — ED Triage Notes (Addendum)
Arrived POV from home. Patient reports heart palpitations, SHOB,and alcoholism. Patient states he wants to quit drinking but consumes large amounts of Vodka daily. Patient's abdomen is round and distended

## 2020-05-29 ENCOUNTER — Emergency Department (HOSPITAL_COMMUNITY): Payer: Medicare Other

## 2020-05-29 ENCOUNTER — Encounter (HOSPITAL_COMMUNITY): Payer: Self-pay

## 2020-05-29 ENCOUNTER — Emergency Department (HOSPITAL_COMMUNITY)
Admission: EM | Admit: 2020-05-29 | Discharge: 2020-05-29 | Disposition: A | Discharge status: Home / Self Care | Payer: Medicare Other | Attending: Emergency Medicine | Admitting: Emergency Medicine

## 2020-05-29 ENCOUNTER — Telehealth: Payer: Self-pay | Admitting: *Deleted

## 2020-05-29 DIAGNOSIS — R072 Precordial pain: Secondary | ICD-10-CM

## 2020-05-29 DIAGNOSIS — R0602 Shortness of breath: Secondary | ICD-10-CM

## 2020-05-29 HISTORY — DX: Heart failure, unspecified: I50.9

## 2020-05-29 LAB — COMPREHENSIVE METABOLIC PANEL
ALT: 76 U/L — ABNORMAL HIGH (ref 0–44)
AST: 98 U/L — ABNORMAL HIGH (ref 15–41)
Albumin: 4.1 g/dL (ref 3.5–5.0)
Alkaline Phosphatase: 154 U/L — ABNORMAL HIGH (ref 38–126)
Anion gap: 17 — ABNORMAL HIGH (ref 5–15)
BUN: 11 mg/dL (ref 6–20)
CO2: 25 mmol/L (ref 22–32)
Calcium: 10.2 mg/dL (ref 8.9–10.3)
Chloride: 94 mmol/L — ABNORMAL LOW (ref 98–111)
Creatinine, Ser: 0.97 mg/dL (ref 0.61–1.24)
GFR calc Af Amer: >60 mL/min (ref >60)
GFR calc non Af Amer: >60 mL/min (ref >60)
Glucose, Bld: 152 mg/dL — ABNORMAL HIGH (ref 70–99)
Potassium: 3.4 mmol/L — ABNORMAL LOW (ref 3.5–5.1)
Sodium: 136 mmol/L (ref 135–145)
Total Bilirubin: 1.3 mg/dL — ABNORMAL HIGH (ref 0.3–1.2)
Total Protein: 8.3 g/dL — ABNORMAL HIGH (ref 6.5–8.1)

## 2020-05-29 LAB — ETHANOL: Alcohol, Ethyl (B): 225 mg/dL — ABNORMAL HIGH (ref <10)

## 2020-05-29 LAB — BRAIN NATRIURETIC PEPTIDE: B Natriuretic Peptide: 36.6 pg/mL (ref 0.0–100.0)

## 2020-05-29 LAB — CBC
HCT: 38 % — ABNORMAL LOW (ref 39.0–52.0)
Hemoglobin: 12.2 g/dL — ABNORMAL LOW (ref 13.0–17.0)
MCH: 27.2 pg (ref 26.0–34.0)
MCHC: 32.1 g/dL (ref 30.0–36.0)
MCV: 84.6 fL (ref 80.0–100.0)
Platelets: 199 10*3/uL (ref 150–400)
RBC: 4.49 MIL/uL (ref 4.22–5.81)
RDW: 17.5 % — ABNORMAL HIGH (ref 11.5–15.5)
WBC: 6.5 10*3/uL (ref 4.0–10.5)
nRBC: 0 % (ref 0.0–0.2)

## 2020-05-29 LAB — SARS CORONAVIRUS 2 BY RT PCR (HOSPITAL ORDER, PERFORMED IN ~~LOC~~ HOSPITAL LAB): SARS Coronavirus 2: NEGATIVE

## 2020-05-29 LAB — TROPONIN I (HIGH SENSITIVITY): Troponin I (High Sensitivity): 5 ng/L (ref <18)

## 2020-05-29 NOTE — Discharge Instructions (Signed)
You were seen in the emergency room today with chest discomfort with shortness of breath.  Your lab work is largely unremarkable.  Your chest x-ray is well-appearing and your Covid test is negative.  Your markers for heart attack were normal.  Please follow closely with your primary care doctor and remain compliant with your home medications.  Return to the emergency department with any new or suddenly worsening symptoms.

## 2020-05-29 NOTE — ED Provider Notes (Signed)
Emergency Department Provider Note   I have reviewed the triage vital signs and the nursing notes.   HISTORY  Chief Complaint Palpitations, Shortness of Breath, and Alcohol Intoxication   HPI [name] is a 59 y.o. male with PMH of alcohol dependence, CHF, diabetes, presents to the emergency department valuation of shortness of breath with heart palpitations and left-sided chest discomfort.  Patient presents from home and developed symptoms over the past couple of weeks.  He feels worse in the morning and symptoms seem to fade throughout the day.  He denies any fevers or shaking chills.  He does drink daily but is hoping to cut back on his drinking in the near future.  He denies any abdominal pain but does note some mild distention which is been present for months.  No hemoptysis.  No known sick contacts.  Symptoms not particularly worse with ambulation. No hematemesis or diarrhea.    Past Medical History:  Diagnosis Date  . Alcohol dependence ([place])   . Arthritis    hips, hands  . Bilateral carpal tunnel syndrome   . Bilateral leg edema    Chronic  . CHF (congestive heart failure) ([place])   . Diabetes mellitus without complication ([place])    type 2  . Diverticulitis    portion of colon removed  . DOE (dyspnea on exertion)    occ  . Elevated liver enzymes   . Fatty liver   . GERD (gastroesophageal reflux disease)    occ  . Hammer toe   . Hip pain   . History of ventral hernia repair 2016   x2  . Hyperlipidemia    pt unsure  . Hypertension   . Marijuana abuse   . Morbid obesity ([place])   . Neuromuscular disorder ([place])    peripheral neuropathy feet and few fingers  . OSA (obstructive sleep apnea)    has OSA-not used CPAP 2-3 yrs could not tolerate cpap  . PONV (postoperative nausea and vomiting)   . Toe ulcer ([place])    left healed    Patient Active Problem List   Diagnosis Date Noted  . Amputated toe of right foot ([place]) 03/03/2020  . Bleeding 03/03/2020  . Acute  postoperative anemia due to greater than expected blood loss 03/03/2020  . Osteomyelitis of ankle or foot, right, acute ([place]) 02/27/2020  . Clogged ear, left 01/22/2020  . Skin abnormality 01/22/2020  . Alcoholic peripheral neuropathy ([place]) 10/28/2019  . Chronic diastolic CHF (congestive heart failure) ([place]) 10/15/2019  . Chronic foot ulcer ([place]) 10/15/2019  . SIRS (systemic inflammatory response syndrome) ([place]) 10/15/2019  . Lymphedema 05/04/2019  . Alcoholic cirrhosis of liver without ascites ([place]) 04/27/2019  . Obesity hypoventilation syndrome ([place]) 04/27/2019  . Depression 04/25/2019  . Severe alcohol use disorder ([place])   . DOE (dyspnea on exertion) 04/22/2019  . Hypothyroidism 04/22/2019  . Anemia 02/06/2019  . Avascular necrosis of hip, left ([place]) 11/02/2018  . Decreased hearing of both ears 10/30/2018  . Avascular necrosis of hip, right ([place]) 08/16/2018  . Bilateral leg edema 02/27/2018  . Marijuana abuse 02/16/2018  . Ulcer of left heel ([place]) 12/21/2016  . Bilateral carpal tunnel syndrome 12/15/2016  . Fatty liver 12/14/2016  . Hyperlipidemia 11/25/2016  . Elevated liver enzymes 11/25/2016  . Prediabetes 11/25/2016  . Hypertension 11/23/2016  . Osteomyelitis due to type 2 diabetes mellitus ([place]) 11/23/2016  . Morbid obesity ([place]) 11/23/2016  . OSA (obstructive sleep apnea) 11/23/2016  . Other hammer toe (acquired) 11/11/2013  .  Exstrophy of bladder 11/11/2013    Past Surgical History:  Procedure Laterality Date  . AMPUTATION TOE Left 05/25/2018   Procedure: AMPUTATION TOE left 3rd;  Surgeon: Wylene Simmer, MD;  Location: [facility];  Service: Orthopedics;  Laterality: Left;  40mn, to follow  . AMPUTATION TOE Left 06/28/2018   Procedure: Left foot revision 3rd toe amputation including 3rd metatarsal;  Surgeon: HWylene Simmer MD;  [place]  Service: Orthopedics;  Laterality: Left;  682m  . BICEPS TENDON REPAIR Left 2014   Partial   . COLONOSCOPY    . GRAFT APPLICATION Right 02/17/69/4888 Procedure: GRAFT APPLICATION;  Surgeon: StLandis MartinsDPM;  [place] [facility] Service: [place];  Laterality: Right;  . HERNIA REPAIR  2016   ventral  . HIP CLOSED REDUCTION Right 09/01/2018   Procedure: CLOSED REDUCTION HIP;  Surgeon: RoNicholes StairsMD;  Location[facility];  Service: Orthopedics;  Laterality: Right;  . INCISION AND DRAINAGE OF WOUND Right 03/03/2020   Procedure: IRRIGATION AND DEBRIDEMENT WOUND;  Surgeon: StLandis MartinsDPM;  [place] [facility] Service: Podiatry;  Laterality: Right;  . JOINT REPLACEMENT     b/l knees   . METATARSAL HEAD EXCISION Right 03/03/2020   Procedure: IRRIGATION OF TOE AND CAUTERIZATION OF BLEEDING TOE;  Surgeon: StLandis MartinsDPM;  [place] [facility] Service: Podiatry;  Laterality: Right;  . METATARSAL HEAD EXCISION Right 03/03/2020   Procedure: METATARSAL HEAD EXCISION SECOND TOE RIGHT;  Surgeon: StLandis MartinsDPM;  [place] [facility] Service: Podiatry;  Laterality: Right;  MAC WITH LOCAL  . TOE AMPUTATION Right 09/2013  . TOTAL HIP ARTHROPLASTY Right 08/16/2018   Procedure: TOTAL HIP ARTHROPLASTY ANTERIOR APPROACH;  Surgeon: SwRod CanMD;  [facility];  Service: Orthopedics;  Laterality: Right;  . TOTAL HIP ARTHROPLASTY Left 11/02/2018   Procedure: TOTAL HIP ARTHROPLASTY ANTERIOR APPROACH;  Surgeon: SwRod CanMD;  [facility];  Service: Orthopedics;  Laterality: Left;  . TOTAL KNEE ARTHROPLASTY     bilat    Allergies Claritin [loratadine]  Family History  Adopted: Yes  Family history unknown: Yes    Social History Social History   Tobacco Use  . Smoking status: Former Smoker    Packs/day: 0.25    Years: 10.00    Pack years: 2.50    Types: Cigarettes  . Smokeless tobacco: Never Used  . Tobacco comment: quit 2018  Vaping Use  . Vaping Use: Never used  Substance Use Topics  .  Alcohol use: Not Currently    Comment: 2-3 drinks 4-5 nights a week, liquor  . Drug use: Yes    Frequency: 1.0 times per week    Types: Marijuana    Comment: "4 times a month, maybe"    Review of Systems  Constitutional: No fever/chills Eyes: No visual changes. ENT: No sore throat. Cardiovascular: Positive chest pain and palpitations.  Respiratory: Positive shortness of breath. Gastrointestinal: No abdominal pain.  No nausea, no vomiting.  No diarrhea.  No constipation. Genitourinary: Negative for dysuria. Musculoskeletal: Negative for back pain. Skin: Negative for rash. Neurological: Negative for headaches, focal weakness or numbness.  10-point ROS otherwise negative.  ____________________________________________   PHYSICAL EXAM:  VITAL SIGNS: ED Triage Vitals  Enc Vitals Group     BP 05/28/20 2351 (!) 181/113     Pulse Rate 05/28/20 2351 (!) 113     Resp 05/28/20 2351 (!) 24  Temp 05/28/20 2351 98.8 F (37.1 C)     Temp Source 05/28/20 2351 Oral     SpO2 05/28/20 2351 95 %     Weight 05/29/20 0003 280 lb (127 kg)     Height 05/29/20 0003 _0  (1.854 m)   Constitutional: Alert and oriented. Well appearing and in no acute distress. Eyes: Conjunctivae are normal.  Head: Atraumatic. Nose: No congestion/rhinnorhea. Mouth/Throat: Mucous membranes are moist.  Neck: No stridor.   Cardiovascular: Tachycardia. Good peripheral circulation. Grossly normal heart sounds.   Respiratory: Normal respiratory effort.  No retractions. Lungs CTAB. Gastrointestinal: Soft and nontender. No distention.  Musculoskeletal: No lower extremity tenderness nor edema. No gross deformities of extremities. Neurologic:  Normal speech and language. No gross focal neurologic deficits are appreciated.  Skin:  Skin is warm, dry and intact. No rash noted.   ____________________________________________   LABS (all labs ordered are listed, but only abnormal results are displayed)  Labs  Reviewed  CBC - Abnormal; Notable for the following components:      Result Value   Hemoglobin 12.2 (*)    HCT 38.0 (*)    RDW 17.5 (*)    All other components within normal limits  COMPREHENSIVE METABOLIC PANEL - Abnormal; Notable for the following components:   Potassium 3.4 (*)    Chloride 94 (*)    Glucose, Bld 152 (*)    Total Protein 8.3 (*)    AST 98 (*)    ALT 76 (*)    Alkaline Phosphatase 154 (*)    Total Bilirubin 1.3 (*)    Anion gap 17 (*)    All other components within normal limits  ETHANOL - Abnormal; Notable for the following components:   Alcohol, Ethyl (B) 225 (*)    All other components within normal limits  SARS CORONAVIRUS 2 BY RT PCR (HOSPITAL ORDER, [facility])  BRAIN NATRIURETIC PEPTIDE  TROPONIN I (HIGH SENSITIVITY)  TROPONIN I (HIGH SENSITIVITY)   ____________________________________________  EKG   EKG Interpretation  Date/Time:  Wednesday May 28 2020 23:51:52 EDT Ventricular Rate:  111 PR Interval:    QRS Duration: 99 QT Interval:  345 QTC Calculation: 469 R Axis:   46 Text Interpretation: Sinus tachycardia Low voltage, precordial leads Minimal ST depression, inferior leads 12 Lead; [place] No STEMI Confirmed by Nanda Quinton [phone]) on 05/29/2020 4:12:33 AM       ____________________________________________  RADIOLOGY  DG Chest Portable 1 View  Result Date: 05/29/2020 CLINICAL DATA:  Heart palpitations. EXAM: PORTABLE CHEST 1 VIEW COMPARISON:  Chest x-ray 04/24/2019 FINDINGS: The cardiac silhouette, mediastinal and hilar contours are within normal limits and stable. The lungs are clear. No pleural effusions. No pulmonary lesions. The bony thorax is intact. IMPRESSION: No acute cardiopulmonary findings. Electronically Signed   By: Marijo Sanes M.D.   On: 05/29/2020 05:51    ____________________________________________   PROCEDURES  Procedure(s) performed:   Procedures  None   ____________________________________________   INITIAL IMPRESSION / ASSESSMENT AND PLAN / ED COURSE  Pertinent labs & imaging results that were available during my care of the patient were reviewed by me and considered in my medical decision making (see chart for details).   Patient presents to the ED with CP, SOB, and palpitations. EKS with no acute ischemia. Considered PE but tachycardia improved without intervention here. Lower suspicion for this clinically. COVID negative. Troponin negative. Plan for close PCP follow up and ED return precautions.    ____________________________________________  FINAL  CLINICAL IMPRESSION(S) / ED DIAGNOSES  Final diagnoses:  Precordial chest pain  SOB (shortness of breath)    Note:  This document was prepared using Dragon voice recognition software and may include unintentional dictation errors.  Nanda Quinton, MD, [facility] Emergency Medicine    Dillon Mcreynolds, [facility], MD 05/30/20 [phone]

## 2020-05-29 NOTE — ED Notes (Signed)
Pulse ox while ambulating 98%RA

## 2020-05-29 NOTE — Telephone Encounter (Signed)
Val you can send [name] from [facility] an email and she can help [place] with this. Reason for return is: goal of therapy met. Thanks Dr. Chauncey Cruel

## 2020-05-29 NOTE — Telephone Encounter (Signed)
Pt states Dr. Marylene Land attempted use of the wound vac and he is trying to return it, [facility] states they need a letter from Dr. Marylene Land explaining the return and why it was not used.

## 2020-05-29 NOTE — ED Notes (Signed)
Patient c/o sob and left sided chest discomfort for the past 2 weeks that only bothers him when he wakes up in the morning then goes away.

## 2020-05-30 NOTE — Telephone Encounter (Signed)
Emailed reason for discontinuing therapy is goal of therapy met to [name] - 61[facility].

## 2020-06-04 ENCOUNTER — Telehealth: Payer: Self-pay | Admitting: Sports Medicine

## 2020-06-04 NOTE — Telephone Encounter (Signed)
He can weightbear in boot, if wound healed on the bottom of the right foot he can transition to normal shoe

## 2020-06-04 NOTE — Telephone Encounter (Signed)
Renee  From [facility] wants to know about weight bearing status and if pt still needs to wear boot

## 2020-06-06 ENCOUNTER — Encounter: Payer: Self-pay | Admitting: Sports Medicine

## 2020-06-06 NOTE — Telephone Encounter (Signed)
Patient calling to follow up on the return of his wound vac. Patient is still receiving bills from [facility] and would like for [place] to reach out to them to confirm the return. I do see where a previous email was sent from [place] to [place] on 05/30/20.  Patient gave a fax number to send documentation to for the return. [phone]

## 2020-06-06 NOTE — Telephone Encounter (Signed)
Sent letter to [facility] via fax

## 2020-06-12 ENCOUNTER — Encounter: Payer: Medicare Other | Admitting: Sports Medicine

## 2020-06-13 ENCOUNTER — Other Ambulatory Visit: Payer: Self-pay | Admitting: Internal Medicine

## 2020-06-17 ENCOUNTER — Encounter: Payer: Self-pay | Admitting: Internal Medicine

## 2020-06-17 DIAGNOSIS — E662 Morbid (severe) obesity with alveolar hypoventilation: Secondary | ICD-10-CM

## 2020-06-20 ENCOUNTER — Telehealth: Payer: Self-pay

## 2020-06-20 NOTE — Telephone Encounter (Signed)
Fax sent to [phone]

## 2020-08-06 ENCOUNTER — Other Ambulatory Visit: Payer: Self-pay | Admitting: Internal Medicine

## 2020-08-15 ENCOUNTER — Other Ambulatory Visit: Payer: Self-pay | Admitting: Internal Medicine

## 2020-08-15 ENCOUNTER — Telehealth: Payer: Self-pay | Admitting: Internal Medicine

## 2020-08-15 NOTE — Telephone Encounter (Signed)
Patient states he is out of medication. He says he will call back on Monday to schedule his November appointment  Requesting med refill be processed

## 2020-08-15 NOTE — Telephone Encounter (Signed)
Patient got a call from his pharmacy stating that they cant fill this medication for him until 09/03/20

## 2020-08-15 NOTE — Telephone Encounter (Signed)
Per office policy sent 30 day to local pharmacy until appt.../lmb  

## 2020-08-19 NOTE — Telephone Encounter (Signed)
Message left for patient. Last refill was sent on 08/15/20 so he should not be out of medication. If he has not picked up script form 08/15/20 he may do so. He needs to schedule his 6 month follow up appointment with Dr. Lawerance Bach per her last note.

## 2020-08-19 NOTE — Telephone Encounter (Signed)
Patient is calling again and wants to know why the medication can not be filled until 11/17. He states he out of medication.   Furoseminde 40mg 

## 2020-08-26 ENCOUNTER — Other Ambulatory Visit: Payer: Self-pay | Admitting: Internal Medicine

## 2020-09-19 ENCOUNTER — Ambulatory Visit: Payer: Medicare Other | Attending: Internal Medicine

## 2020-09-19 DIAGNOSIS — Z23 Encounter for immunization: Secondary | ICD-10-CM

## 2020-09-19 NOTE — Progress Notes (Signed)
   Covid-19 Vaccination Clinic  Name:  [name]    MRN: [id number] DOB: [birth year]  09/19/2020  [name] was observed post Covid-19 immunization for 15 minutes without incident. He was provided with Vaccine Information Sheet and instruction to access the V-Safe system.   [name] was instructed to call 911 with any severe reactions post vaccine: Marland Kitchen Difficulty breathing  . Swelling of face and throat  . A fast heartbeat  . A bad rash all over body  . Dizziness and weakness   Immunizations Administered    Name Date Dose VIS Date Route   Pfizer COVID-19 Vaccine 09/19/2020  4:40 PM 0.3 mL 08/06/2020 Intramuscular   Manufacturer: [employer], [employer]   Lot: [id number]   NDC: [address]-3

## 2020-09-26 ENCOUNTER — Other Ambulatory Visit: Payer: Self-pay | Admitting: Internal Medicine

## 2020-10-01 ENCOUNTER — Ambulatory Visit: Payer: Medicare Other | Admitting: Internal Medicine

## 2020-10-06 ENCOUNTER — Other Ambulatory Visit: Payer: Self-pay | Admitting: Internal Medicine

## 2020-10-22 ENCOUNTER — Other Ambulatory Visit: Payer: Self-pay | Admitting: Internal Medicine

## 2020-10-24 ENCOUNTER — Other Ambulatory Visit: Payer: Self-pay | Admitting: Sports Medicine

## 2020-10-24 ENCOUNTER — Other Ambulatory Visit: Payer: Self-pay | Admitting: Internal Medicine

## 2020-10-26 NOTE — Telephone Encounter (Signed)
Please advise 

## 2020-11-18 ENCOUNTER — Other Ambulatory Visit: Payer: Self-pay | Admitting: Internal Medicine

## 2020-11-24 ENCOUNTER — Ambulatory Visit (INDEPENDENT_AMBULATORY_CARE_PROVIDER_SITE_OTHER): Payer: Medicare Other | Admitting: Internal Medicine

## 2020-11-24 ENCOUNTER — Encounter: Payer: Self-pay | Admitting: Internal Medicine

## 2020-11-24 ENCOUNTER — Other Ambulatory Visit: Payer: Self-pay

## 2020-11-24 ENCOUNTER — Ambulatory Visit (INDEPENDENT_AMBULATORY_CARE_PROVIDER_SITE_OTHER): Payer: Medicare Other

## 2020-11-24 VITALS — BP 140/70 | HR 106 | Temp 98.1°F | Ht 73.0 in | Wt 300.6 lb

## 2020-11-24 DIAGNOSIS — R600 Localized edema: Secondary | ICD-10-CM

## 2020-11-24 DIAGNOSIS — K7030 Alcoholic cirrhosis of liver without ascites: Secondary | ICD-10-CM

## 2020-11-24 DIAGNOSIS — G621 Alcoholic polyneuropathy: Secondary | ICD-10-CM

## 2020-11-24 DIAGNOSIS — I5033 Acute on chronic diastolic (congestive) heart failure: Secondary | ICD-10-CM | POA: Diagnosis not present

## 2020-11-24 DIAGNOSIS — R0602 Shortness of breath: Secondary | ICD-10-CM | POA: Diagnosis not present

## 2020-11-24 DIAGNOSIS — E039 Hypothyroidism, unspecified: Secondary | ICD-10-CM | POA: Diagnosis not present

## 2020-11-24 DIAGNOSIS — I10 Essential (primary) hypertension: Secondary | ICD-10-CM | POA: Diagnosis not present

## 2020-11-24 DIAGNOSIS — R7303 Prediabetes: Secondary | ICD-10-CM | POA: Diagnosis not present

## 2020-11-24 DIAGNOSIS — R140 Abdominal distension (gaseous): Secondary | ICD-10-CM

## 2020-11-24 DIAGNOSIS — F1020 Alcohol dependence, uncomplicated: Secondary | ICD-10-CM

## 2020-11-24 LAB — CBC WITH DIFFERENTIAL/PLATELET
Basophils Absolute: 0 10*3/uL (ref 0.0–0.1)
Basophils Relative: 0.7 % (ref 0.0–3.0)
Eosinophils Absolute: 0 10*3/uL (ref 0.0–0.7)
Eosinophils Relative: 0.7 % (ref 0.0–5.0)
HCT: 38.6 % — ABNORMAL LOW (ref 39.0–52.0)
Hemoglobin: 13.2 g/dL (ref 13.0–17.0)
Lymphocytes Relative: 18.1 % (ref 12.0–46.0)
Lymphs Abs: 1.3 10*3/uL (ref 0.7–4.0)
MCHC: 34.3 g/dL (ref 30.0–36.0)
MCV: 93.6 fl (ref 78.0–100.0)
Monocytes Absolute: 0.7 10*3/uL (ref 0.1–1.0)
Monocytes Relative: 9.7 % (ref 3.0–12.0)
Neutro Abs: 5.2 10*3/uL (ref 1.4–7.7)
Neutrophils Relative %: 70.8 % (ref 43.0–77.0)
Platelets: 197 10*3/uL (ref 150.0–400.0)
RBC: 4.12 Mil/uL — ABNORMAL LOW (ref 4.22–5.81)
RDW: 17.2 % — ABNORMAL HIGH (ref 11.5–15.5)
WBC: 7.3 10*3/uL (ref 4.0–10.5)

## 2020-11-24 LAB — COMPREHENSIVE METABOLIC PANEL
ALT: 30 U/L (ref 0–53)
AST: 80 U/L — ABNORMAL HIGH (ref 0–37)
Albumin: 3.9 g/dL (ref 3.5–5.2)
Alkaline Phosphatase: 189 U/L — ABNORMAL HIGH (ref 39–117)
BUN: 6 mg/dL (ref 6–23)
CO2: 27 mEq/L (ref 19–32)
Calcium: 8.3 mg/dL — ABNORMAL LOW (ref 8.4–10.5)
Chloride: 88 mEq/L — ABNORMAL LOW (ref 96–112)
Creatinine, Ser: 0.66 mg/dL (ref 0.40–1.50)
GFR: 102.78 mL/min (ref >60.00)
Glucose, Bld: 123 mg/dL — ABNORMAL HIGH (ref 70–99)
Potassium: 2.9 mEq/L — ABNORMAL LOW (ref 3.5–5.1)
Sodium: 134 mEq/L — ABNORMAL LOW (ref 135–145)
Total Bilirubin: 1.5 mg/dL — ABNORMAL HIGH (ref 0.2–1.2)
Total Protein: 8.6 g/dL — ABNORMAL HIGH (ref 6.0–8.3)

## 2020-11-24 LAB — TSH: TSH: 2.62 u[IU]/mL (ref 0.35–4.50)

## 2020-11-24 LAB — HEMOGLOBIN A1C: Hgb A1c MFr Bld: 6.5 % (ref 4.6–6.5)

## 2020-11-24 LAB — BRAIN NATRIURETIC PEPTIDE: Pro B Natriuretic peptide (BNP): 26 pg/mL (ref 0.0–100.0)

## 2020-11-24 MED ORDER — LEVOTHYROXINE SODIUM 50 MCG PO TABS: 50.0000 ug | ORAL_TABLET | Freq: Every day | ORAL | 1 refills | Status: DC

## 2020-11-24 MED ORDER — FUROSEMIDE 40 MG PO TABS: 40.0000 mg | ORAL_TABLET | Freq: Two times a day (BID) | ORAL | 1 refills | Status: DC

## 2020-11-24 MED ORDER — GABAPENTIN 300 MG PO CAPS
ORAL_CAPSULE | ORAL | 3 refills | Status: DC
Start: 2020-11-24 — End: 2020-11-26

## 2020-11-24 NOTE — Assessment & Plan Note (Signed)
Chronic He has been experiencing abdominal distention/bloating and there is concern for ascites on exam He has been drinking heavily and has known cirrhosis CMP, CBC Abdominal ultrasound He understands the importance of alcohol cessation and wants to go to an inpatient rehab program

## 2020-11-24 NOTE — Assessment & Plan Note (Signed)
Chronic Gherghe controlled Mild edema bilaterally-not increased Continue Lasix twice daily-dose increased due to possible fluid overload/ascites

## 2020-11-24 NOTE — Assessment & Plan Note (Signed)
Chronic BP fairly controlled Continue metoprolol 50 mg twice daily and Lasix twice daily-increase lasix 80 mg in morning and 40 mg in afternoon cmp

## 2020-11-24 NOTE — Assessment & Plan Note (Signed)
Chronic Continue duloxetine 20 mg daily Start gabapentin 300 mg daily, and then twice daily, then 3 times daily-this will be for his peripheral neuropathy and may help with his alcoholism

## 2020-11-24 NOTE — Patient Instructions (Addendum)
  Blood work was ordered.  A chest x-ray was ordered.    An abdominal ultrasound was ordered   Medications changes include :   Increase lasix to 80 mg in am, and 40 mg in afternoon - we will adjust this further after the blood work gets back.  Start gabapentin.      Your prescription(s) have been submitted to your pharmacy. Please take as directed and contact our office if you believe you are having problem(s) with the medication(s).    Please followup in 1 month

## 2020-11-24 NOTE — Assessment & Plan Note (Signed)
Acute ?  Acute on chronic diastolic heart failure ?  Right-sided heart failure from untreated sleep apnea ?  Decompensation of liver cirrhosis He does have a history of diastolic CHF Check CMP, BNP, chest x-ray given shortness of breath Increase Lasix from 40 mg twice daily to 80 mg in the morning and 40 mg in the afternoon Further changes in medication depending on blood work and chest x-ray results

## 2020-11-24 NOTE — Assessment & Plan Note (Signed)
Chronic Check A1c 

## 2020-11-24 NOTE — Progress Notes (Signed)
Subjective:    Patient ID: [name], male    DOB: [birth year], 60 y.o.   MRN: [id number]  HPI The patient is here for an acute visit.  He is here with his girlfriend.  Swelling in abdomen -   He denies any swelling in his legs-he just has swelling in his abdomen.  He has drinking pretty heavy-this started back in October.  He typically drinks a large bottle vodka every 5-7 days.  He knows he needs to stop.  He did come to the realization that he needs to go to an inpatient program.  He is taking taking lasix 40 mg twice daily.  He has not been taking the thyroid medication.    Medications and allergies reviewed with patient and updated if appropriate.  Patient Active Problem List   Diagnosis Date Noted  . Amputated toe of right foot ([place]) 03/03/2020  . Osteomyelitis of ankle or foot, right, acute ([place]) 02/27/2020  . Alcoholic peripheral neuropathy ([place]) 10/28/2019  . Chronic diastolic CHF (congestive heart failure) ([place]) 10/15/2019  . Chronic foot ulcer ([place]) 10/15/2019  . Lymphedema 05/04/2019  . Alcoholic cirrhosis of liver without ascites ([place]) 04/27/2019  . Obesity hypoventilation syndrome ([employer]) 04/27/2019  . Depression 04/25/2019  . Severe alcohol use disorder ([place])   . DOE (dyspnea on exertion) 04/22/2019  . Hypothyroidism 04/22/2019  . Anemia 02/06/2019  . Acute on chronic diastolic CHF (congestive heart failure) ([place]) 01/30/2019  . Avascular necrosis of hip, left ([place]) 11/02/2018  . Decreased hearing of both ears 10/30/2018  . Avascular necrosis of hip, right ([place]) 08/16/2018  . Bilateral leg edema 02/27/2018  . Marijuana abuse 02/16/2018  . Ulcer of left heel ([place]) 12/21/2016  . Bilateral carpal tunnel syndrome 12/15/2016  . Hyperlipidemia 11/25/2016  . Prediabetes 11/25/2016  . Hypertension 11/23/2016  . Osteomyelitis due to type 2 diabetes mellitus ([place]) 11/23/2016  . Morbid obesity ([place]) 11/23/2016  . OSA (obstructive sleep apnea) 11/23/2016  . Other  hammer toe (acquired) 11/11/2013  . Exstrophy of bladder 11/11/2013    Current Outpatient Medications on File Prior to Visit  Medication Sig Dispense Refill  . docusate sodium (COLACE) 100 MG capsule TAKE 1 CAPSULE (100 MG TOTAL) BY MOUTH DAILY AS NEEDED. 30 capsule 2  . DULoxetine (CYMBALTA) 20 MG capsule TAKE 1 CAPSULE BY MOUTH EVERY DAY 90 capsule 1  . furosemide (LASIX) 40 MG tablet TAKE 1 TABLET BY MOUTH TWICE A DAY ANNUAL APPT DUE IN NOV MUST SEE PROVIDER FOR FUTURE REFILLS 60 tablet 0  . levothyroxine (SYNTHROID) 50 MCG tablet TAKE 1 TABLET (50 MCG TOTAL) BY MOUTH DAILY. NEED OFFICE VISIT FOR MORE REFILLS. 90 tablet 1  . metoprolol tartrate (LOPRESSOR) 50 MG tablet Take 50 mg by mouth 2 (two) times daily.    . potassium chloride SA (KLOR-CON M20) 20 MEQ tablet TAKE 2 TABLETS (40 MEQ TOTAL) BY MOUTH 3 (THREE) TIMES DAILY. 540 tablet 0  . traZODone (DESYREL) 50 MG tablet TAKE 1-2 TABLETS (50-100 MG TOTAL) BY MOUTH AT BEDTIME AS NEEDED FOR SLEEP. (Patient taking differently: Take 50 mg by mouth at bedtime as needed for sleep.) 180 tablet 1  . vitamin B-12 (CYANOCOBALAMIN) 100 MCG tablet Take 100 mcg by mouth daily.    . cholecalciferol (VITAMIN D3) 25 MCG (1000 UNIT) tablet Take 1,000 Units by mouth daily. (Patient not taking: Reported on 11/24/2020)    . TURMERIC PO Take 1 tablet by mouth daily.  (Patient not taking: Reported on 11/24/2020)  No current facility-administered medications on file prior to visit.    Past Medical History:  Diagnosis Date  . Alcohol dependence ([place])   . Arthritis    hips, hands  . Bilateral carpal tunnel syndrome   . Bilateral leg edema    Chronic  . CHF (congestive heart failure) ([place])   . Diabetes mellitus without complication ([place])    type 2  . Diverticulitis    portion of colon removed  . DOE (dyspnea on exertion)    occ  . Elevated liver enzymes   . Fatty liver   . GERD (gastroesophageal reflux disease)    occ  . Hammer toe   . Hip pain    . History of ventral hernia repair 2016   x2  . Hyperlipidemia    pt unsure  . Hypertension   . Marijuana abuse   . Morbid obesity ([place])   . Neuromuscular disorder ([place])    peripheral neuropathy feet and few fingers  . OSA (obstructive sleep apnea)    has OSA-not used CPAP 2-3 yrs could not tolerate cpap  . PONV (postoperative nausea and vomiting)   . Toe ulcer ([place])    left healed    Past Surgical History:  Procedure Laterality Date  . AMPUTATION TOE Left 05/25/2018   Procedure: AMPUTATION TOE left 3rd;  Surgeon: Wylene Simmer, MD;  Location: [facility];  Service: Orthopedics;  Laterality: Left;  5mn, to follow  . AMPUTATION TOE Left 06/28/2018   Procedure: Left foot revision 3rd toe amputation including 3rd metatarsal;  Surgeon: HWylene Simmer MD;  [facility]  Service: Orthopedics;  Laterality: Left;  639m  . BICEPS TENDON REPAIR Left 2014   Partial  . COLONOSCOPY    . GRAFT APPLICATION Right 02/18/65/5993 Procedure: GRAFT APPLICATION;  Surgeon: StLandis MartinsDPM;  [facility] Service: Podiatry;  Laterality: Right;  . HERNIA REPAIR  2016   ventral  . HIP CLOSED REDUCTION Right 09/01/2018   Procedure: CLOSED REDUCTION HIP;  Surgeon: RoNicholes StairsMD;  [facility];  Service: Orthopedics;  Laterality: Right;  . INCISION AND DRAINAGE OF WOUND Right 03/03/2020   Procedure: IRRIGATION AND DEBRIDEMENT WOUND;  Surgeon: StLandis MartinsDPM;  [facility] Service: Podiatry;  Laterality: Right;  . JOINT REPLACEMENT     b/l knees   . METATARSAL HEAD EXCISION Right 03/03/2020   Procedure: IRRIGATION OF TOE AND CAUTERIZATION OF BLEEDING TOE;  Surgeon: StLandis MartinsDPM;  [facility] Service: Podiatry;  Laterality: Right;  . METATARSAL HEAD EXCISION Right 03/03/2020   Procedure: METATARSAL HEAD EXCISION SECOND TOE RIGHT;  Surgeon: StLandis MartinsDPM;  [facility] Service: Podiatry;  Laterality: Right;  MAC WITH LOCAL  . TOE AMPUTATION Right 09/2013  . TOTAL HIP ARTHROPLASTY Right 08/16/2018   Procedure: TOTAL HIP ARTHROPLASTY ANTERIOR APPROACH;  Surgeon: SwRod CanMD;  [facility];  Service: Orthopedics;  Laterality: Right;  . TOTAL HIP ARTHROPLASTY Left 11/02/2018   Procedure: TOTAL HIP ARTHROPLASTY ANTERIOR APPROACH;  Surgeon: SwRod CanMD;  [facility];  Service: Orthopedics;  Laterality: Left;  . TOTAL KNEE ARTHROPLASTY     bilat    Social History   Socioeconomic History  . Marital status: Divorced    Spouse name: Not on file  . Number of children: Not on file  . Years of education: Not on file  . Highest education  level: Not on file  Occupational History  . Occupation: unemployed, Hydrographic surveyor for disability  Tobacco Use  . Smoking status: Former Smoker    Packs/day: 0.25    Years: 10.00    Pack years: 2.50    Types: Cigarettes  . Smokeless tobacco: Never Used  . Tobacco comment: quit 2018  Vaping Use  . Vaping Use: Never used  Substance and Sexual Activity  . Alcohol use: Not Currently    Comment: 2-3 drinks 4-5 nights a week, liquor  . Drug use: Yes    Frequency: 1.0 times per week    Types: Marijuana    Comment: "4 times a month, maybe"  . Sexual activity: Not on file  Other Topics Concern  . Not on file  Social History Narrative  . Not on file   Social Determinants of Health   Financial Resource Strain: Not on file  Food Insecurity: Not on file  Transportation Needs: Not on file  Physical Activity: Not on file  Stress: Not on file  Social Connections: Not on file    Family History  Adopted: Yes  Family history unknown: Yes    Review of Systems  Constitutional: Negative for fever.  Respiratory: Positive for cough (in morning - brings up mucus then ok for the day - last week it has been more during the day) and shortness of breath (in the morning and walking around). Negative  for wheezing.   Cardiovascular: Negative for chest pain, palpitations and leg swelling.  Gastrointestinal: Positive for abdominal distention (swelling), constipation and nausea (in morning). Negative for abdominal pain, blood in stool and diarrhea.       No gerd  Neurological: Negative for light-headedness and headaches.       Objective:   Vitals:   11/24/20 1301  BP: 140/70  Pulse: (!) 106  Temp: 98.1 F (36.7 C)  SpO2: 96%   BP Readings from Last 3 Encounters:  11/24/20 140/70  05/29/20 (!) 147/88  03/11/20 124/70   Wt Readings from Last 3 Encounters:  11/24/20 (!) 300 lb 9.6 oz (136.4 kg)  05/29/20 280 lb (127 kg)  03/03/20 288 lb 2.3 oz (130.7 kg)   Body mass index is 39.66 kg/m.   Physical Exam    Constitutional: Appears well-developed and well-nourished. No distress.  Head: Normocephalic and atraumatic.  Neck: Neck supple. No tracheal deviation present. No thyromegaly present.  No cervical lymphadenopathy Cardiovascular: Normal rate, regular rhythm and normal heart sounds.  No murmur heard. No carotid bruit .  Mild bilateral lower extremity minimally pitting edema Pulmonary/Chest: Effort normal and breath sounds normal. No respiratory distress. No has no wheezes. No rales. Abdomen: Obese, distended, soft, nontender, possible ascites with fluid shift Skin: Skin is warm and dry. Not diaphoretic.  Psychiatric: Normal mood and affect. Behavior is normal.       Assessment & Plan:    See Problem List for Assessment and Plan of chronic medical problems.    This visit occurred during the SARS-CoV-2 public health emergency.  Safety protocols were in place, including screening questions prior to the visit, additional usage of staff PPE, and extensive cleaning of exam room while observing appropriate contact time as indicated for disinfecting solutions.

## 2020-11-24 NOTE — Assessment & Plan Note (Signed)
Chronic Has not been taking his thyroid medication for approximately a couple of weeks We will check TSH Restart levothyroxine 50 mcg daily

## 2020-11-24 NOTE — Assessment & Plan Note (Signed)
Chronic Has been abusing alcohol intermittently over the years-drinking heavily since October of last year-approximately 1 large bottle of vodka every 5-7 days He understands that he needs to go into an inpatient rehab in order to quit and he will look into this

## 2020-11-25 ENCOUNTER — Other Ambulatory Visit: Payer: Self-pay | Admitting: Internal Medicine

## 2020-11-25 ENCOUNTER — Telehealth: Payer: Self-pay | Admitting: Internal Medicine

## 2020-11-25 NOTE — Telephone Encounter (Signed)
His potassium is low - he said he was taking it daily so he needs to continue, but take take an additional 2 pills.  Liver tests are slightly elevated.  Blood counts are ok.  No evidence of heart failure. Thyroid function is normal so for now hold off on taking thyroid medication - we will just monitor this.  Sugars now in diabetic range so he does have diabetes.

## 2020-11-25 NOTE — Telephone Encounter (Signed)
Spoke with patient and info given 

## 2020-11-26 ENCOUNTER — Telehealth: Payer: Self-pay | Admitting: Internal Medicine

## 2020-11-26 ENCOUNTER — Encounter: Payer: Self-pay | Admitting: Internal Medicine

## 2020-11-26 MED ORDER — GABAPENTIN 100 MG PO CAPS: 100.0000 mg | ORAL_CAPSULE | Freq: Three times a day (TID) | ORAL | 3 refills | Status: DC

## 2020-11-26 NOTE — Telephone Encounter (Signed)
Patient called and had some questions about gabapentin (NEURONTIN) 300 MG capsule. He said he took his first dose this morning and he said he is nauseous, tired, has aches. He said that he has been in bed all day because he just doesn't have the energy to do anything. Please advise.

## 2020-11-26 NOTE — Telephone Encounter (Signed)
lets try 100 mg three times a day - sent new rx

## 2020-11-26 NOTE — Telephone Encounter (Signed)
Spoke with patient today. 

## 2020-12-30 ENCOUNTER — Telehealth: Payer: Self-pay | Admitting: Internal Medicine

## 2020-12-30 NOTE — Telephone Encounter (Signed)
Called number given and left message to return phone call to office and provide fax number.

## 2020-12-30 NOTE — Telephone Encounter (Signed)
Patient calling, states when he was here on 02.07.22 he signed a medical records release for [place] to send his labs to [facility]. He just spoke with their office and they never received them.  [facility]- [phone]

## 2021-01-14 ENCOUNTER — Other Ambulatory Visit: Payer: Self-pay | Admitting: Internal Medicine

## 2021-01-15 ENCOUNTER — Telehealth: Payer: Self-pay | Admitting: Internal Medicine

## 2021-01-15 NOTE — Telephone Encounter (Signed)
Patient called and is needing clarification on how he is supposed to be taking furosemide (LASIX) 40 MG tablet. He thought he needed to be taking 2 tablets twice daily. He can be reached at [phone]

## 2021-01-16 MED ORDER — FUROSEMIDE 40 MG PO TABS: ORAL_TABLET | ORAL | 0 refills | Status: DC

## 2021-01-16 NOTE — Telephone Encounter (Signed)
Lets do 80 mg in am and 40 mg in afternoon.  He needs to take his potassium daily.  He is due for a f/u with me  rx sent

## 2021-01-16 NOTE — Telephone Encounter (Signed)
Message left for patient today 

## 2021-02-06 ENCOUNTER — Other Ambulatory Visit (HOSPITAL_COMMUNITY): Payer: Self-pay | Admitting: Orthopedic Surgery

## 2021-02-06 ENCOUNTER — Other Ambulatory Visit: Payer: Self-pay | Admitting: Orthopedic Surgery

## 2021-02-06 DIAGNOSIS — Z96642 Presence of left artificial hip joint: Secondary | ICD-10-CM

## 2021-02-19 ENCOUNTER — Encounter (HOSPITAL_COMMUNITY)
Admission: RE | Admit: 2021-02-19 | Discharge: 2021-02-19 | Disposition: A | Discharge status: Home / Self Care | Payer: Medicare Other | Source: Ambulatory Visit | Attending: Orthopedic Surgery | Admitting: Orthopedic Surgery

## 2021-02-19 ENCOUNTER — Other Ambulatory Visit: Payer: Self-pay

## 2021-02-19 DIAGNOSIS — Z96642 Presence of left artificial hip joint: Secondary | ICD-10-CM | POA: Diagnosis not present

## 2021-02-19 MED ORDER — TECHNETIUM TC 99M MEDRONATE IV KIT
21.5000 | PACK | Freq: Once | INTRAVENOUS | Status: AC
  Administered 2021-02-19: 21.5 via INTRAVENOUS

## 2021-02-26 ENCOUNTER — Telehealth: Payer: Self-pay | Admitting: Internal Medicine

## 2021-02-26 DIAGNOSIS — L97509 Non-pressure chronic ulcer of other part of unspecified foot with unspecified severity: Secondary | ICD-10-CM

## 2021-02-26 NOTE — Telephone Encounter (Signed)
Referral ordered for Dr Lajoyce Corners - orthopedics

## 2021-02-26 NOTE — Telephone Encounter (Signed)
   Patient requesting referral to foot specialist. He states he has an ulcer on bottom of foot. He does not want to be referred back to  [facility]   Please advise

## 2021-03-09 NOTE — Patient Instructions (Signed)
  Blood work was ordered.     Medications changes include :     Your prescription(s) have been submitted to your pharmacy. Please take as directed and contact our office if you believe you are having problem(s) with the medication(s).   A referral was ordered for        Someone from their office will call you to schedule an appointment.    Please followup in 6 months  

## 2021-03-09 NOTE — Progress Notes (Signed)
Subjective:    Patient ID: [name], male    DOB: Mar 17, 1961, 60 y.o.   MRN: [id number]  HPI The patient is here for follow up of their chronic medical problems, including htn, HFpEF, alcoholic peripheral neuropathy, cirrhosis, hypothyroidism, insomnia, prediabetes, depression, lymphedema, morbid obesity, right foot ulcer    Medications and allergies reviewed with patient and updated if appropriate.  Patient Active Problem List   Diagnosis Date Noted  . Amputated toe of right foot ([place]) 03/03/2020  . Alcoholic peripheral neuropathy ([place]) 10/28/2019  . Chronic diastolic CHF (congestive heart failure) ([place]) 10/15/2019  . Chronic foot ulcer ([place]) 10/15/2019  . Alcoholic cirrhosis of liver without ascites ([place]) 04/27/2019  . Obesity hypoventilation syndrome ([place]) 04/27/2019  . Depression 04/25/2019  . Severe alcohol use disorder ([place])   . DOE (dyspnea on exertion) 04/22/2019  . Hypothyroidism 04/22/2019  . Anemia 02/06/2019  . Acute on chronic diastolic CHF (congestive heart failure) ([place]) 01/30/2019  . Avascular necrosis of hip, left ([place]) 11/02/2018  . Decreased hearing of both ears 10/30/2018  . Avascular necrosis of hip, right ([place]) 08/16/2018  . Bilateral leg edema 02/27/2018  . Marijuana abuse 02/16/2018  . Ulcer of left heel ([place]) 12/21/2016  . Bilateral carpal tunnel syndrome 12/15/2016  . Hyperlipidemia 11/25/2016  . Prediabetes 11/25/2016  . Hypertension 11/23/2016  . History of osteomyelitis 11/23/2016  . Morbid obesity ([place]) 11/23/2016  . OSA (obstructive sleep apnea) 11/23/2016  . Other hammer toe (acquired) 11/11/2013  . Exstrophy of bladder 11/11/2013    Current Outpatient Medications on File Prior to Visit  Medication Sig Dispense Refill  . docusate sodium (COLACE) 100 MG capsule TAKE 1 CAPSULE (100 MG TOTAL) BY MOUTH DAILY AS NEEDED. 30 capsule 2  . DULoxetine (CYMBALTA) 20 MG capsule TAKE 1 CAPSULE BY MOUTH EVERY DAY 90 capsule 1  . furosemide  (LASIX) 40 MG tablet Take 80 mg in morning and 40 mg in afternoon 270 tablet 0  . gabapentin (NEURONTIN) 100 MG capsule Take 1 capsule (100 mg total) by mouth 3 (three) times daily. 90 capsule 3  . levothyroxine (SYNTHROID) 50 MCG tablet Take 1 tablet (50 mcg total) by mouth daily. 90 tablet 1  . metoprolol tartrate (LOPRESSOR) 50 MG tablet Take 50 mg by mouth 2 (two) times daily.    . potassium chloride SA (KLOR-CON M20) 20 MEQ tablet TAKE 2 TABLETS (40 MEQ TOTAL) BY MOUTH 3 (THREE) TIMES DAILY. 540 tablet 0  . traZODone (DESYREL) 50 MG tablet TAKE 1-2 TABLETS (50-100 MG TOTAL) BY MOUTH AT BEDTIME AS NEEDED FOR SLEEP. (Patient taking differently: Take 50 mg by mouth at bedtime as needed for sleep.) 180 tablet 1  . vitamin B-12 (CYANOCOBALAMIN) 100 MCG tablet Take 100 mcg by mouth daily.     No current facility-administered medications on file prior to visit.    Past Medical History:  Diagnosis Date  . Alcohol dependence ([place])   . Arthritis    hips, hands  . Bilateral carpal tunnel syndrome   . Bilateral leg edema    Chronic  . CHF (congestive heart failure) ([place])   . Diabetes mellitus without complication ([place])    type 2  . Diverticulitis    portion of colon removed  . DOE (dyspnea on exertion)    occ  . Elevated liver enzymes   . Fatty liver   . GERD (gastroesophageal reflux disease)    occ  . Hammer toe   . Hip pain   . History  of ventral hernia repair 2016   x2  . Hyperlipidemia    pt unsure  . Hypertension   . Marijuana abuse   . Morbid obesity ([place])   . Neuromuscular disorder ([place])    peripheral neuropathy feet and few fingers  . OSA (obstructive sleep apnea)    has OSA-not used CPAP 2-3 yrs could not tolerate cpap  . PONV (postoperative nausea and vomiting)   . Toe ulcer ([place])    left healed    Past Surgical History:  Procedure Laterality Date  . AMPUTATION TOE Left 05/25/2018   Procedure: AMPUTATION TOE left 3rd;  Surgeon: Wylene Simmer, MD;  Location: [facility];  Service: Orthopedics;  Laterality: Left;  37mn, to follow  . AMPUTATION TOE Left 06/28/2018   Procedure: Left foot revision 3rd toe amputation including 3rd metatarsal;  Surgeon: HWylene Simmer MD;  [facility]  Service: Orthopedics;  Laterality: Left;  655m  . BICEPS TENDON REPAIR Left 2014   Partial  . COLONOSCOPY    . GRAFT APPLICATION Right 02/24/59/4540 Procedure: GRAFT APPLICATION;  Surgeon: StLandis MartinsDPM;  [facility] Service: Podiatry;  Laterality: Right;  . HERNIA REPAIR  2016   ventral  . HIP CLOSED REDUCTION Right 09/01/2018   Procedure: CLOSED REDUCTION HIP;  Surgeon: RoNicholes StairsMD;  [facility];  Service: Orthopedics;  Laterality: Right;  . INCISION AND DRAINAGE OF WOUND Right 03/03/2020   Procedure: IRRIGATION AND DEBRIDEMENT WOUND;  Surgeon: StLandis MartinsDPM;  [facility] Service: Podiatry;  Laterality: Right;  . JOINT REPLACEMENT     b/l knees   . METATARSAL HEAD EXCISION Right 03/03/2020   Procedure: IRRIGATION OF TOE AND CAUTERIZATION OF BLEEDING TOE;  Surgeon: StLandis MartinsDPM;  [facility] Service: Podiatry;  Laterality: Right;  . METATARSAL HEAD EXCISION Right 03/03/2020   Procedure: METATARSAL HEAD EXCISION SECOND TOE RIGHT;  Surgeon: StLandis MartinsDPM;  [place] Service: Podiatry;  Laterality: Right;  MAC WITH LOCAL  . TOE AMPUTATION Right 09/2013  . TOTAL HIP ARTHROPLASTY Right 08/16/2018   Procedure: TOTAL HIP ARTHROPLASTY ANTERIOR APPROACH;  Surgeon: SwRod CanMD;  [facility];  Service: Orthopedics;  Laterality: Right;  . TOTAL HIP ARTHROPLASTY Left 11/02/2018   Procedure: TOTAL HIP ARTHROPLASTY ANTERIOR APPROACH;  Surgeon: SwRod CanMD;  [facility];  Service: Orthopedics;  Laterality: Left;  . TOTAL KNEE ARTHROPLASTY     bilat    Social History   Socioeconomic History  .  Marital status: Divorced    Spouse name: Not on file  . Number of children: Not on file  . Years of education: Not on file  . Highest education level: Not on file  Occupational History  . Occupation: unemployed, fiHydrographic surveyoror disability  Tobacco Use  . Smoking status: Former Smoker    Packs/day: 0.25    Years: 10.00    Pack years: 2.50    Types: Cigarettes  . Smokeless tobacco: Never Used  . Tobacco comment: quit 2018  Vaping Use  . Vaping Use: Never used  Substance and Sexual Activity  . Alcohol use: Not Currently    Comment: 2-3 drinks 4-5 nights a week, liquor  . Drug use: Yes    Frequency: 1.0 times per week    Types: Marijuana    Comment: "4 times a month, maybe"  . Sexual activity: Not on file  Other Topics Concern  . Not on file  Social History Narrative  . Not on file   Social Determinants of Health   Financial Resource Strain: Not on file  Food Insecurity: Not on file  Transportation Needs: Not on file  Physical Activity: Not on file  Stress: Not on file  Social Connections: Not on file    Family History  Adopted: Yes  Family history unknown: Yes    Review of Systems     Objective:  There were no vitals filed for this visit. BP Readings from Last 3 Encounters:  11/24/20 140/70  05/29/20 (!) 147/88  03/11/20 124/70   Wt Readings from Last 3 Encounters:  11/24/20 (!) 300 lb 9.6 oz (136.4 kg)  05/29/20 280 lb (127 kg)  03/03/20 288 lb 2.3 oz (130.7 kg)   There is no height or weight on file to calculate BMI.   Physical Exam    Constitutional: Appears well-developed and well-nourished. No distress.  HENT:  Head: Normocephalic and atraumatic.  Neck: Neck supple. No tracheal deviation present. No thyromegaly present.  No cervical lymphadenopathy Cardiovascular: Normal rate, regular rhythm and normal heart sounds.   No murmur heard. No carotid bruit .  No edema Pulmonary/Chest: Effort normal and breath sounds normal. No respiratory distress. No has  no wheezes. No rales.  Skin: Skin is warm and dry. Not diaphoretic.  Psychiatric: Normal mood and affect. Behavior is normal.      Assessment & Plan:    See Problem List for Assessment and Plan of chronic medical problems.    This visit occurred during the SARS-CoV-2 public health emergency.  Safety protocols were in place, including screening questions prior to the visit, additional usage of staff PPE, and extensive cleaning of exam room while observing appropriate contact time as indicated for disinfecting solutions.    This encounter was created in error - please disregard.

## 2021-03-10 ENCOUNTER — Encounter: Payer: Medicare Other | Admitting: Internal Medicine

## 2021-03-10 DIAGNOSIS — I5032 Chronic diastolic (congestive) heart failure: Secondary | ICD-10-CM

## 2021-03-10 DIAGNOSIS — K7030 Alcoholic cirrhosis of liver without ascites: Secondary | ICD-10-CM

## 2021-03-10 DIAGNOSIS — F1020 Alcohol dependence, uncomplicated: Secondary | ICD-10-CM

## 2021-03-10 DIAGNOSIS — E782 Mixed hyperlipidemia: Secondary | ICD-10-CM

## 2021-03-10 DIAGNOSIS — I10 Essential (primary) hypertension: Secondary | ICD-10-CM

## 2021-03-10 DIAGNOSIS — R7303 Prediabetes: Secondary | ICD-10-CM

## 2021-03-10 DIAGNOSIS — E039 Hypothyroidism, unspecified: Secondary | ICD-10-CM

## 2021-03-10 DIAGNOSIS — G621 Alcoholic polyneuropathy: Secondary | ICD-10-CM

## 2021-03-10 DIAGNOSIS — F3289 Other specified depressive episodes: Secondary | ICD-10-CM

## 2021-03-13 ENCOUNTER — Other Ambulatory Visit: Payer: Self-pay

## 2021-03-16 NOTE — Progress Notes (Signed)
Subjective:    Patient ID: [name], male    DOB: 05/10/1961, 60 y.o.   MRN: [id number]  HPI The patient is here for follow up of their chronic medical problems, including htn, HFpEF, alcoholic peripheral neuropathy, cirrhosis, hypothyroidism, insomnia, prediabetes, depression, lymphedema, morbid obesity, right foot ulcer   He has back pain - radiates to left hip and to mid thigh.  It is debilitating at times.  Dr swinteck did an xray.  Has MRI scheduled.   Has MRI scheduled for left foot ulcer.  He is following with Dr Doran Durand.  MRI scheduled for the 4th   He is drinking again and stopped all alcohol since Friday, 4 days ago.  He is doing Garment/textile technologist.  Taking clonidine, gabapentin and hydrxoxyine.  He was prescribed deterrent for drinking, but has not started.    He has neuropathy pain.    Medications and allergies reviewed with patient and updated if appropriate.  Patient Active Problem List   Diagnosis Date Noted  . Amputated toe of right foot ([place]) 03/03/2020  . Alcoholic peripheral neuropathy ([place]) 10/28/2019  . Chronic diastolic CHF (congestive heart failure) ([place]) 10/15/2019  . Chronic foot ulcer ([place]) 10/15/2019  . Alcoholic cirrhosis of liver without ascites ([place]) 04/27/2019  . Obesity hypoventilation syndrome ([place]) 04/27/2019  . Depression 04/25/2019  . Severe alcohol use disorder ([place])   . DOE (dyspnea on exertion) 04/22/2019  . Hypothyroidism 04/22/2019  . Anemia 02/06/2019  . Acute on chronic diastolic CHF (congestive heart failure) ([place]) 01/30/2019  . Avascular necrosis of hip, left ([place]) 11/02/2018  . Decreased hearing of both ears 10/30/2018  . Avascular necrosis of hip, right ([place]) 08/16/2018  . Bilateral leg edema 02/27/2018  . Marijuana abuse 02/16/2018  . Ulcer of left heel ([place]) 12/21/2016  . Bilateral carpal tunnel syndrome 12/15/2016  . Hyperlipidemia 11/25/2016  . Prediabetes 11/25/2016  . Hypertension 11/23/2016  . History of osteomyelitis  11/23/2016  . Morbid obesity ([place]) 11/23/2016  . OSA (obstructive sleep apnea) 11/23/2016  . Other hammer toe (acquired) 11/11/2013  . Exstrophy of bladder 11/11/2013    Current Outpatient Medications on File Prior to Visit  Medication Sig Dispense Refill  . cloNIDine (CATAPRES) 0.1 MG tablet Take 0.1 mg by mouth every 6 (six) hours as needed.    . docusate sodium (COLACE) 100 MG capsule TAKE 1 CAPSULE (100 MG TOTAL) BY MOUTH DAILY AS NEEDED. 30 capsule 2  . DULoxetine (CYMBALTA) 20 MG capsule TAKE 1 CAPSULE BY MOUTH EVERY DAY 90 capsule 1  . furosemide (LASIX) 40 MG tablet Take 80 mg in morning and 40 mg in afternoon 270 tablet 0  . gabapentin (NEURONTIN) 100 MG capsule Take 1 capsule (100 mg total) by mouth 3 (three) times daily. 90 capsule 3  . hydrOXYzine (ATARAX/VISTARIL) 25 MG tablet Take 25 mg by mouth every 6 (six) hours as needed.    Marland Kitchen levothyroxine (SYNTHROID) 50 MCG tablet Take 1 tablet (50 mcg total) by mouth daily. 90 tablet 1  . ondansetron (ZOFRAN-ODT) 4 MG disintegrating tablet SMARTSIG:1-2 Tablet(s) Sublingual Every 6-8 Hours PRN    . potassium chloride SA (KLOR-CON M20) 20 MEQ tablet TAKE 2 TABLETS (40 MEQ TOTAL) BY MOUTH 3 (THREE) TIMES DAILY. 540 tablet 0  . SANTYL ointment Apply topically daily.    . traZODone (DESYREL) 50 MG tablet TAKE 1-2 TABLETS (50-100 MG TOTAL) BY MOUTH AT BEDTIME AS NEEDED FOR SLEEP. (Patient taking differently: Take 50 mg by mouth at bedtime as needed  for sleep.) 180 tablet 1  . vitamin B-12 (CYANOCOBALAMIN) 100 MCG tablet Take 100 mcg by mouth daily.     No current facility-administered medications on file prior to visit.    Past Medical History:  Diagnosis Date  . Alcohol dependence ([place])   . Arthritis    hips, hands  . Bilateral carpal tunnel syndrome   . Bilateral leg edema    Chronic  . CHF (congestive heart failure) ([place])   . Diabetes mellitus without complication ([place])    type 2  . Diverticulitis    portion of colon removed  .  DOE (dyspnea on exertion)    occ  . Elevated liver enzymes   . Fatty liver   . GERD (gastroesophageal reflux disease)    occ  . Hammer toe   . Hip pain   . History of ventral hernia repair 2016   x2  . Hyperlipidemia    pt unsure  . Hypertension   . Marijuana abuse   . Morbid obesity ([place])   . Neuromuscular disorder ([name])    peripheral neuropathy feet and few fingers  . OSA (obstructive sleep apnea)    has OSA-not used CPAP 2-3 yrs could not tolerate cpap  . PONV (postoperative nausea and vomiting)   . Toe ulcer ([place])    left healed    Past Surgical History:  Procedure Laterality Date  . AMPUTATION TOE Left 05/25/2018   Procedure: AMPUTATION TOE left 3rd;  Surgeon: Wylene Simmer, MD;  Location: [facility];  Service: Orthopedics;  Laterality: Left;  32mn, to follow  . AMPUTATION TOE Left 06/28/2018   Procedure: Left foot revision 3rd toe amputation including 3rd metatarsal;  Surgeon: HWylene Simmer MD;  [facility]  Service: Orthopedics;  Laterality: Left;  657m  . BICEPS TENDON REPAIR Left 2014   Partial  . COLONOSCOPY    . GRAFT APPLICATION Right 5/0/93/2671 Procedure: GRAFT APPLICATION;  Surgeon: StLandis MartinsDPM;  [facility] Service: Podiatry;  Laterality: Right;  . HERNIA REPAIR  2016   ventral  . HIP CLOSED REDUCTION Right 09/01/2018   Procedure: CLOSED REDUCTION HIP;  Surgeon: RoNicholes StairsMD;  Location[facility];  Service: Orthopedics;  Laterality: Right;  . INCISION AND DRAINAGE OF WOUND Right 03/03/2020   Procedure: IRRIGATION AND DEBRIDEMENT WOUND;  Surgeon: StLandis MartinsDPM;  [facility] Service: Podiatry;  Laterality: Right;  . JOINT REPLACEMENT     b/l knees   . METATARSAL HEAD EXCISION Right 03/03/2020   Procedure: IRRIGATION OF TOE AND CAUTERIZATION OF BLEEDING TOE;  Surgeon: StLandis MartinsDPM;  [facility] Service: Podiatry;  Laterality: Right;   . METATARSAL HEAD EXCISION Right 03/03/2020   Procedure: METATARSAL HEAD EXCISION SECOND TOE RIGHT;  Surgeon: StLandis MartinsDPM;  [facility] Service: Podiatry;  Laterality: Right;  MAC WITH LOCAL  . TOE AMPUTATION Right 09/2013  . TOTAL HIP ARTHROPLASTY Right 08/16/2018   Procedure: TOTAL HIP ARTHROPLASTY ANTERIOR APPROACH;  Surgeon: SwRod CanMD;  [facility];  Service: Orthopedics;  Laterality: Right;  . TOTAL HIP ARTHROPLASTY Left 11/02/2018   Procedure: TOTAL HIP ARTHROPLASTY ANTERIOR APPROACH;  Surgeon: SwRod CanMD;  [facility];  Service: Orthopedics;  Laterality: Left;  . TOTAL KNEE ARTHROPLASTY     bilat    Social History   Socioeconomic History  . Marital status: Divorced    Spouse name:  Not on file  . Number of children: Not on file  . Years of education: Not on file  . Highest education level: Not on file  Occupational History  . Occupation: unemployed, Hydrographic surveyor for disability  Tobacco Use  . Smoking status: Former Smoker    Packs/day: 0.25    Years: 10.00    Pack years: 2.50    Types: Cigarettes  . Smokeless tobacco: Never Used  . Tobacco comment: quit 2018  Vaping Use  . Vaping Use: Never used  Substance and Sexual Activity  . Alcohol use: Not Currently    Comment: 2-3 drinks 4-5 nights a week, liquor  . Drug use: Yes    Frequency: 1.0 times per week    Types: Marijuana    Comment: "4 times a month, maybe"  . Sexual activity: Not on file  Other Topics Concern  . Not on file  Social History Narrative  . Not on file   Social Determinants of Health   Financial Resource Strain: Not on file  Food Insecurity: Not on file  Transportation Needs: Not on file  Physical Activity: Not on file  Stress: Not on file  Social Connections: Not on file    Family History  Adopted: Yes  Family history unknown: Yes    Review of Systems  Constitutional: Positive for chills (w/ detoxing). Negative for fever.   Respiratory: Negative for cough, shortness of breath and wheezing.   Cardiovascular: Positive for leg swelling (controlled). Negative for chest pain and palpitations.  Gastrointestinal: Positive for nausea (occ - ? from pills).       Rare gerd  Neurological: Negative for light-headedness and headaches.       Balance is off       Objective:   Vitals:   03/17/21 1503  BP: 120/76  Pulse: (!) 101  Temp: 98.5 F (36.9 C)  SpO2: 97%   BP Readings from Last 3 Encounters:  03/17/21 120/76  11/24/20 140/70  05/29/20 (!) 147/88   Wt Readings from Last 3 Encounters:  03/17/21 287 lb (130.2 kg)  11/24/20 (!) 300 lb 9.6 oz (136.4 kg)  05/29/20 280 lb (127 kg)   Body mass index is 37.87 kg/m.   Depression screen [facility] 2/9 03/17/2021 02/25/2020 02/25/2020 05/04/2019 02/01/2018  Decreased Interest 3 1 0 0 0  Down, Depressed, Hopeless 3 1 0 0 0  PHQ - 2 Score 6 2 0 0 0  Altered sleeping 3 2 - - -  Tired, decreased energy 2 1 - - -  Change in appetite 3 1 - - -  Feeling bad or failure about yourself  1 1 - - -  Trouble concentrating 2 0 - - -  Moving slowly or fidgety/restless 1 0 - - -  Suicidal thoughts 0 1 - - -  PHQ-9 Score 18 8 - - -  Difficult doing work/chores Extremely dIfficult Somewhat difficult - - -  Some recent data might be hidden    GAD 7 : Generalized Anxiety Score 03/17/2021 02/25/2020  Nervous, Anxious, on Edge 1 0  Control/stop worrying 2 0  Worry too much - different things 2 0  Trouble relaxing 1 1  Restless 0 0  Easily annoyed or irritable 2 1  Afraid - awful might happen 1 1  Total GAD 7 Score 9 3  Anxiety Difficulty Extremely difficult -      Physical Exam    Constitutional: Appears well-developed and well-nourished. No distress.  HENT:  Head: Normocephalic and atraumatic.  Ears: b/l ear canals with minimal cerumen, normal TM's b/l Neck: Neck supple. No tracheal deviation present. No thyromegaly present.  No cervical lymphadenopathy Cardiovascular:  Normal rate, regular rhythm and normal heart sounds.   No murmur heard. No carotid bruit .  Mild b/l LE edema Pulmonary/Chest: Effort normal and breath sounds normal. No respiratory distress. No has no wheezes. No rales.  Skin: Skin is warm and dry. Not diaphoretic.  Psychiatric: anxious and depressed mood and affect.  A little jittery. Behavior is normal.      Assessment & Plan:    See Problem List for Assessment and Plan of chronic medical problems.    This visit occurred during the SARS-CoV-2 public health emergency.  Safety protocols were in place, including screening questions prior to the visit, additional usage of staff PPE, and extensive cleaning of exam room while observing appropriate contact time as indicated for disinfecting solutions.

## 2021-03-17 ENCOUNTER — Ambulatory Visit (INDEPENDENT_AMBULATORY_CARE_PROVIDER_SITE_OTHER): Payer: Medicare Other | Admitting: Internal Medicine

## 2021-03-17 ENCOUNTER — Encounter: Payer: Self-pay | Admitting: Internal Medicine

## 2021-03-17 ENCOUNTER — Other Ambulatory Visit: Payer: Self-pay

## 2021-03-17 VITALS — BP 120/76 | HR 101 | Temp 98.5°F | Ht 73.0 in | Wt 287.0 lb

## 2021-03-17 DIAGNOSIS — E782 Mixed hyperlipidemia: Secondary | ICD-10-CM

## 2021-03-17 DIAGNOSIS — F3289 Other specified depressive episodes: Secondary | ICD-10-CM

## 2021-03-17 DIAGNOSIS — I5032 Chronic diastolic (congestive) heart failure: Secondary | ICD-10-CM

## 2021-03-17 DIAGNOSIS — E039 Hypothyroidism, unspecified: Secondary | ICD-10-CM

## 2021-03-17 DIAGNOSIS — I10 Essential (primary) hypertension: Secondary | ICD-10-CM | POA: Diagnosis not present

## 2021-03-17 DIAGNOSIS — R7303 Prediabetes: Secondary | ICD-10-CM

## 2021-03-17 DIAGNOSIS — K7030 Alcoholic cirrhosis of liver without ascites: Secondary | ICD-10-CM

## 2021-03-17 DIAGNOSIS — F1020 Alcohol dependence, uncomplicated: Secondary | ICD-10-CM

## 2021-03-17 DIAGNOSIS — G621 Alcoholic polyneuropathy: Secondary | ICD-10-CM

## 2021-03-17 DIAGNOSIS — F419 Anxiety disorder, unspecified: Secondary | ICD-10-CM | POA: Insufficient documentation

## 2021-03-17 DIAGNOSIS — R600 Localized edema: Secondary | ICD-10-CM

## 2021-03-17 DIAGNOSIS — L97429 Non-pressure chronic ulcer of left heel and midfoot with unspecified severity: Secondary | ICD-10-CM

## 2021-03-17 LAB — CBC WITH DIFFERENTIAL/PLATELET
Basophils Absolute: 0 10*3/uL (ref 0.0–0.1)
Basophils Relative: 0.7 % (ref 0.0–3.0)
Eosinophils Absolute: 0.1 10*3/uL (ref 0.0–0.7)
Eosinophils Relative: 0.9 % (ref 0.0–5.0)
HCT: 37.3 % — ABNORMAL LOW (ref 39.0–52.0)
Hemoglobin: 13 g/dL (ref 13.0–17.0)
Lymphocytes Relative: 23.4 % (ref 12.0–46.0)
Lymphs Abs: 1.6 10*3/uL (ref 0.7–4.0)
MCHC: 34.8 g/dL (ref 30.0–36.0)
MCV: 96.1 fl (ref 78.0–100.0)
Monocytes Absolute: 0.8 10*3/uL (ref 0.1–1.0)
Monocytes Relative: 11.5 % (ref 3.0–12.0)
Neutro Abs: 4.4 10*3/uL (ref 1.4–7.7)
Neutrophils Relative %: 63.5 % (ref 43.0–77.0)
Platelets: 168 10*3/uL (ref 150.0–400.0)
RBC: 3.88 Mil/uL — ABNORMAL LOW (ref 4.22–5.81)
RDW: 20.1 % — ABNORMAL HIGH (ref 11.5–15.5)
WBC: 6.9 10*3/uL (ref 4.0–10.5)

## 2021-03-17 LAB — TSH: TSH: 4.26 u[IU]/mL (ref 0.35–4.50)

## 2021-03-17 LAB — LIPID PANEL
Cholesterol: 168 mg/dL (ref 0–200)
HDL: 42.9 mg/dL (ref >39.00)
LDL Cholesterol: 103 mg/dL — ABNORMAL HIGH (ref 0–99)
NonHDL: 125.4
Total CHOL/HDL Ratio: 4
Triglycerides: 113 mg/dL (ref 0.0–149.0)
VLDL: 22.6 mg/dL (ref 0.0–40.0)

## 2021-03-17 LAB — COMPREHENSIVE METABOLIC PANEL
ALT: 14 U/L (ref 0–53)
AST: 27 U/L (ref 0–37)
Albumin: 3.9 g/dL (ref 3.5–5.2)
Alkaline Phosphatase: 120 U/L — ABNORMAL HIGH (ref 39–117)
BUN: 9 mg/dL (ref 6–23)
CO2: 28 mEq/L (ref 19–32)
Calcium: 9 mg/dL (ref 8.4–10.5)
Chloride: 96 mEq/L (ref 96–112)
Creatinine, Ser: 0.95 mg/dL (ref 0.40–1.50)
GFR: 87.52 mL/min (ref >60.00)
Glucose, Bld: 103 mg/dL — ABNORMAL HIGH (ref 70–99)
Potassium: 3.7 mEq/L (ref 3.5–5.1)
Sodium: 135 mEq/L (ref 135–145)
Total Bilirubin: 1.4 mg/dL — ABNORMAL HIGH (ref 0.2–1.2)
Total Protein: 8 g/dL (ref 6.0–8.3)

## 2021-03-17 LAB — HEMOGLOBIN A1C: Hgb A1c MFr Bld: 5.7 % (ref 4.6–6.5)

## 2021-03-17 NOTE — Assessment & Plan Note (Signed)
Chronic Euvolemic Continue Lasix 80 mg in morning and 40 mg in afternoon CMP

## 2021-03-17 NOTE — Assessment & Plan Note (Signed)
Chronic Has not taken medication in months We will check TSH and see if he needs it

## 2021-03-17 NOTE — Assessment & Plan Note (Signed)
Chronic Blood pressure well controlled Continue Lasix 80 mg in the morning, 40 mg in afternoon Also on clonidine 0.1 mg every 6 hours as needed, which may also be helping his blood pressure CMP

## 2021-03-17 NOTE — Assessment & Plan Note (Signed)
Chronic Per Dr Victorino Dike To have MRI

## 2021-03-17 NOTE — Assessment & Plan Note (Signed)
Chronic Currently in detox program-online Stressed continued alcohol cessation CMP

## 2021-03-17 NOTE — Assessment & Plan Note (Signed)
Chronic Check lipids With cirrhosis so will hold off on statin for now

## 2021-03-17 NOTE — Assessment & Plan Note (Signed)
Chronic With significant neuropathy Continue gabapentin-currently taking 100 mg 2-3 times a day Advised that he can increase this to 2 pills 2-3 times a day to see if that helps Stress alcohol cessation

## 2021-03-17 NOTE — Patient Instructions (Addendum)
    Blood work was ordered.      Medications changes include :   none     Please followup in 6 months  

## 2021-03-17 NOTE — Assessment & Plan Note (Signed)
Chronic PHQ9 score of 18 Currently on Cymbalta 20 mg daily and trazodone 50 mg at night Doing online alcohol detox with psychiatry - they have discussed adjusting medications but he has not wanted to - no changes today since he is following with them Most of his depression is from chronic pain from back, neuropathy and foot Continue above medications

## 2021-03-17 NOTE — Assessment & Plan Note (Signed)
New GAD7 score indicates anxiety - related to chronic pain, active alcohol detox Currently taking cymbalta 20 mg daily - will continue Seeing psychiatry and they have discussed adjusting medications, which he does not feel is necessary - they will monitor

## 2021-03-17 NOTE — Assessment & Plan Note (Signed)
Chronic Intermittent use Currently in online detox  Program Current medications helping with symptoms

## 2021-03-17 NOTE — Assessment & Plan Note (Signed)
Chronic Check a1c Low sugar / carb diet Stressed regular exercise  

## 2021-03-17 NOTE — Assessment & Plan Note (Signed)
Chronic Controlled Continue Lasix 80 mg in morning and 40 mg in afternoon CMP

## 2021-03-19 ENCOUNTER — Telehealth: Payer: Self-pay | Admitting: Internal Medicine

## 2021-03-19 NOTE — Telephone Encounter (Signed)
Patient returned call in regards to lab work

## 2021-03-20 NOTE — Telephone Encounter (Signed)
Spoke with patient today and results given. 

## 2021-03-31 ENCOUNTER — Telehealth: Payer: Self-pay | Admitting: Internal Medicine

## 2021-03-31 NOTE — Telephone Encounter (Signed)
LVM for pt to rtn my call to schedule AWV with [facility]. Please schedule if pt calls the office.  ?

## 2021-04-11 ENCOUNTER — Other Ambulatory Visit: Payer: Self-pay | Admitting: Internal Medicine

## 2021-04-16 ENCOUNTER — Other Ambulatory Visit: Payer: Self-pay | Admitting: Internal Medicine

## 2021-04-23 ENCOUNTER — Other Ambulatory Visit: Payer: Self-pay | Admitting: Internal Medicine

## 2021-04-24 ENCOUNTER — Institutional Professional Consult (permissible substitution): Payer: Medicare Other | Admitting: Plastic Surgery

## 2021-04-27 ENCOUNTER — Inpatient Hospital Stay (HOSPITAL_COMMUNITY): Payer: Medicare Other

## 2021-04-27 ENCOUNTER — Other Ambulatory Visit: Payer: Self-pay

## 2021-04-27 ENCOUNTER — Inpatient Hospital Stay (HOSPITAL_COMMUNITY)
Admission: EM | Admit: 2021-04-27 | Discharge: 2021-05-06 | DRG: 872 | Disposition: A | Discharge status: Skilled Nursing Facility | Payer: Medicare Other | Attending: Family Medicine | Admitting: Family Medicine

## 2021-04-27 ENCOUNTER — Encounter (HOSPITAL_COMMUNITY): Payer: Self-pay

## 2021-04-27 ENCOUNTER — Emergency Department (HOSPITAL_COMMUNITY): Payer: Medicare Other

## 2021-04-27 DIAGNOSIS — I10 Essential (primary) hypertension: Secondary | ICD-10-CM | POA: Diagnosis present

## 2021-04-27 DIAGNOSIS — E785 Hyperlipidemia, unspecified: Secondary | ICD-10-CM | POA: Diagnosis present

## 2021-04-27 DIAGNOSIS — R6520 Severe sepsis without septic shock: Secondary | ICD-10-CM | POA: Diagnosis present

## 2021-04-27 DIAGNOSIS — I5032 Chronic diastolic (congestive) heart failure: Secondary | ICD-10-CM | POA: Diagnosis present

## 2021-04-27 DIAGNOSIS — D649 Anemia, unspecified: Secondary | ICD-10-CM | POA: Diagnosis present

## 2021-04-27 DIAGNOSIS — G621 Alcoholic polyneuropathy: Secondary | ICD-10-CM | POA: Diagnosis present

## 2021-04-27 DIAGNOSIS — Z20822 Contact with and (suspected) exposure to covid-19: Secondary | ICD-10-CM | POA: Diagnosis present

## 2021-04-27 DIAGNOSIS — F1010 Alcohol abuse, uncomplicated: Secondary | ICD-10-CM | POA: Diagnosis present

## 2021-04-27 DIAGNOSIS — E1169 Type 2 diabetes mellitus with other specified complication: Secondary | ICD-10-CM | POA: Diagnosis present

## 2021-04-27 DIAGNOSIS — I890 Lymphedema, not elsewhere classified: Secondary | ICD-10-CM | POA: Diagnosis present

## 2021-04-27 DIAGNOSIS — I110 Hypertensive heart disease with heart failure: Secondary | ICD-10-CM | POA: Diagnosis present

## 2021-04-27 DIAGNOSIS — Z888 Allergy status to other drugs, medicaments and biological substances status: Secondary | ICD-10-CM

## 2021-04-27 DIAGNOSIS — Z87891 Personal history of nicotine dependence: Secondary | ICD-10-CM

## 2021-04-27 DIAGNOSIS — G4733 Obstructive sleep apnea (adult) (pediatric): Secondary | ICD-10-CM | POA: Diagnosis present

## 2021-04-27 DIAGNOSIS — K219 Gastro-esophageal reflux disease without esophagitis: Secondary | ICD-10-CM | POA: Diagnosis present

## 2021-04-27 DIAGNOSIS — L089 Local infection of the skin and subcutaneous tissue, unspecified: Secondary | ICD-10-CM

## 2021-04-27 DIAGNOSIS — E039 Hypothyroidism, unspecified: Secondary | ICD-10-CM | POA: Diagnosis present

## 2021-04-27 DIAGNOSIS — Z6836 Body mass index (BMI) 36.0-36.9, adult: Secondary | ICD-10-CM | POA: Diagnosis not present

## 2021-04-27 DIAGNOSIS — R7982 Elevated C-reactive protein (CRP): Secondary | ICD-10-CM | POA: Diagnosis present

## 2021-04-27 DIAGNOSIS — F10239 Alcohol dependence with withdrawal, unspecified: Secondary | ICD-10-CM | POA: Diagnosis present

## 2021-04-27 DIAGNOSIS — K760 Fatty (change of) liver, not elsewhere classified: Secondary | ICD-10-CM | POA: Diagnosis present

## 2021-04-27 DIAGNOSIS — E871 Hypo-osmolality and hyponatremia: Secondary | ICD-10-CM | POA: Diagnosis not present

## 2021-04-27 DIAGNOSIS — E872 Acidosis: Secondary | ICD-10-CM | POA: Diagnosis present

## 2021-04-27 DIAGNOSIS — R7303 Prediabetes: Secondary | ICD-10-CM | POA: Diagnosis not present

## 2021-04-27 DIAGNOSIS — E11621 Type 2 diabetes mellitus with foot ulcer: Secondary | ICD-10-CM | POA: Diagnosis present

## 2021-04-27 DIAGNOSIS — Z9119 Patient's noncompliance with other medical treatment and regimen: Secondary | ICD-10-CM

## 2021-04-27 DIAGNOSIS — F32A Depression, unspecified: Secondary | ICD-10-CM | POA: Diagnosis present

## 2021-04-27 DIAGNOSIS — M009 Pyogenic arthritis, unspecified: Secondary | ICD-10-CM | POA: Diagnosis present

## 2021-04-27 DIAGNOSIS — E11628 Type 2 diabetes mellitus with other skin complications: Secondary | ICD-10-CM | POA: Diagnosis present

## 2021-04-27 DIAGNOSIS — E1140 Type 2 diabetes mellitus with diabetic neuropathy, unspecified: Secondary | ICD-10-CM | POA: Diagnosis present

## 2021-04-27 DIAGNOSIS — L97529 Non-pressure chronic ulcer of other part of left foot with unspecified severity: Secondary | ICD-10-CM | POA: Diagnosis present

## 2021-04-27 DIAGNOSIS — Z01810 Encounter for preprocedural cardiovascular examination: Secondary | ICD-10-CM

## 2021-04-27 DIAGNOSIS — A419 Sepsis, unspecified organism: Secondary | ICD-10-CM | POA: Diagnosis present

## 2021-04-27 DIAGNOSIS — Z79899 Other long term (current) drug therapy: Secondary | ICD-10-CM

## 2021-04-27 DIAGNOSIS — L97426 Non-pressure chronic ulcer of left heel and midfoot with bone involvement without evidence of necrosis: Secondary | ICD-10-CM | POA: Diagnosis not present

## 2021-04-27 DIAGNOSIS — L97429 Non-pressure chronic ulcer of left heel and midfoot with unspecified severity: Secondary | ICD-10-CM | POA: Diagnosis present

## 2021-04-27 DIAGNOSIS — R609 Edema, unspecified: Secondary | ICD-10-CM | POA: Diagnosis not present

## 2021-04-27 DIAGNOSIS — K7030 Alcoholic cirrhosis of liver without ascites: Secondary | ICD-10-CM | POA: Diagnosis present

## 2021-04-27 DIAGNOSIS — M869 Osteomyelitis, unspecified: Secondary | ICD-10-CM | POA: Diagnosis present

## 2021-04-27 DIAGNOSIS — I70229 Atherosclerosis of native arteries of extremities with rest pain, unspecified extremity: Secondary | ICD-10-CM | POA: Diagnosis not present

## 2021-04-27 DIAGNOSIS — Z89422 Acquired absence of other left toe(s): Secondary | ICD-10-CM

## 2021-04-27 DIAGNOSIS — E782 Mixed hyperlipidemia: Secondary | ICD-10-CM | POA: Diagnosis not present

## 2021-04-27 DIAGNOSIS — Z96643 Presence of artificial hip joint, bilateral: Secondary | ICD-10-CM | POA: Diagnosis present

## 2021-04-27 DIAGNOSIS — L03116 Cellulitis of left lower limb: Secondary | ICD-10-CM | POA: Diagnosis present

## 2021-04-27 DIAGNOSIS — R682 Dry mouth, unspecified: Secondary | ICD-10-CM | POA: Diagnosis not present

## 2021-04-27 DIAGNOSIS — L0390 Cellulitis, unspecified: Secondary | ICD-10-CM | POA: Diagnosis not present

## 2021-04-27 DIAGNOSIS — E876 Hypokalemia: Secondary | ICD-10-CM | POA: Diagnosis present

## 2021-04-27 DIAGNOSIS — L538 Other specified erythematous conditions: Secondary | ICD-10-CM | POA: Diagnosis not present

## 2021-04-27 DIAGNOSIS — M659 Synovitis and tenosynovitis, unspecified: Secondary | ICD-10-CM | POA: Diagnosis present

## 2021-04-27 DIAGNOSIS — Z79890 Hormone replacement therapy (postmenopausal): Secondary | ICD-10-CM

## 2021-04-27 LAB — CBC WITH DIFFERENTIAL/PLATELET
Abs Immature Granulocytes: 1.14 10*3/uL — ABNORMAL HIGH (ref 0.00–0.07)
Basophils Absolute: 0 10*3/uL (ref 0.0–0.1)
Basophils Relative: 0 %
Eosinophils Absolute: 0 10*3/uL (ref 0.0–0.5)
Eosinophils Relative: 0 %
HCT: 34 % — ABNORMAL LOW (ref 39.0–52.0)
Hemoglobin: 11.6 g/dL — ABNORMAL LOW (ref 13.0–17.0)
Immature Granulocytes: 7 %
Lymphocytes Relative: 8 %
Lymphs Abs: 1.3 10*3/uL (ref 0.7–4.0)
MCH: 32.5 pg (ref 26.0–34.0)
MCHC: 34.1 g/dL (ref 30.0–36.0)
MCV: 95.2 fL (ref 80.0–100.0)
Monocytes Absolute: 1.3 10*3/uL — ABNORMAL HIGH (ref 0.1–1.0)
Monocytes Relative: 8 %
Neutro Abs: 12.2 10*3/uL — ABNORMAL HIGH (ref 1.7–7.7)
Neutrophils Relative %: 77 %
Platelets: 154 10*3/uL (ref 150–400)
RBC: 3.57 MIL/uL — ABNORMAL LOW (ref 4.22–5.81)
RDW: 15.6 % — ABNORMAL HIGH (ref 11.5–15.5)
WBC: 15.9 10*3/uL — ABNORMAL HIGH (ref 4.0–10.5)
nRBC: 0 % (ref 0.0–0.2)

## 2021-04-27 LAB — BASIC METABOLIC PANEL
Anion gap: 17 — ABNORMAL HIGH (ref 5–15)
BUN: 10 mg/dL (ref 6–20)
CO2: 23 mmol/L (ref 22–32)
Calcium: 8.4 mg/dL — ABNORMAL LOW (ref 8.9–10.3)
Chloride: 92 mmol/L — ABNORMAL LOW (ref 98–111)
Creatinine, Ser: 0.78 mg/dL (ref 0.61–1.24)
GFR, Estimated: >60 mL/min (ref >60)
Glucose, Bld: 156 mg/dL — ABNORMAL HIGH (ref 70–99)
Potassium: 3 mmol/L — ABNORMAL LOW (ref 3.5–5.1)
Sodium: 132 mmol/L — ABNORMAL LOW (ref 135–145)

## 2021-04-27 LAB — SEDIMENTATION RATE: Sed Rate: 85 mm/hr — ABNORMAL HIGH (ref 0–16)

## 2021-04-27 LAB — C-REACTIVE PROTEIN: CRP: 33.5 mg/dL — ABNORMAL HIGH (ref <1.0)

## 2021-04-27 MED ORDER — SODIUM CHLORIDE 0.9 % IV SOLN
2.0000 g | INTRAVENOUS | Status: DC
  Administered 2021-04-28: 2 g via INTRAVENOUS
  Filled 2021-04-27: qty 20

## 2021-04-27 MED ORDER — METRONIDAZOLE 500 MG/100ML IV SOLN
500.0000 mg | Freq: Three times a day (TID) | INTRAVENOUS | Status: DC
  Administered 2021-04-28 – 2021-04-29 (×5): 500 mg via INTRAVENOUS
  Filled 2021-04-27 (×5): qty 100

## 2021-04-27 MED ORDER — METRONIDAZOLE 500 MG/100ML IV SOLN: 500.0000 mg | Freq: Three times a day (TID) | INTRAVENOUS | Status: DC

## 2021-04-27 MED ORDER — OXYCODONE-ACETAMINOPHEN 5-325 MG PO TABS
1.0000 | ORAL_TABLET | Freq: Four times a day (QID) | ORAL | Status: DC | PRN
  Administered 2021-04-28: 1 via ORAL
  Filled 2021-04-27: qty 1

## 2021-04-27 MED ORDER — VANCOMYCIN HCL 2000 MG/400ML IV SOLN
2000.0000 mg | Freq: Once | INTRAVENOUS | Status: AC
  Administered 2021-04-28: 2000 mg via INTRAVENOUS
  Filled 2021-04-27: qty 400

## 2021-04-27 MED ORDER — INSULIN ASPART 100 UNIT/ML IJ SOLN
0.0000 [IU] | INTRAMUSCULAR | Status: DC
  Administered 2021-04-28 – 2021-04-30 (×3): 1 [IU] via SUBCUTANEOUS
  Filled 2021-04-27: qty 0.09

## 2021-04-27 MED ORDER — POTASSIUM CHLORIDE 10 MEQ/100ML IV SOLN
10.0000 meq | INTRAVENOUS | Status: AC
  Administered 2021-04-28 (×4): 10 meq via INTRAVENOUS
  Filled 2021-04-27 (×4): qty 100

## 2021-04-27 NOTE — ED Triage Notes (Addendum)
Pt states that he went to his doctors office today and they told him to come to the Emergency room for treatment of his left swollen foot. Pts left foot and leg is swollen and red. Foot is hot to touch. +4 pitting edema.

## 2021-04-27 NOTE — ED Provider Notes (Signed)
[facility] DEPT Provider Note   CSN: [id number] Arrival date & time: 04/27/21  1848     History Chief Complaint  Patient presents with   Left Foot Infection    [name] is a 60 y.o. male with a history of congestive heart failure, chronic lymphedema, type 2 diabetes with significant peripheral neuropathy, diabetic foot ulcers, presenting to emergency department concern for left foot infection.  The patient reports that he has been seen by his orthopedic specialist today in the office, there was concerned about the erythema involving his left foot.  He has a large diabetic foot wound on there and has had this for several weeks.  He changes the dressings every day.  He feels like the swelling in his leg has gotten worse, but says that it tends to come and go at different times.  He denies fevers or chills at home.  He reports he was told he needs to come to the hospital for "IV antibiotics and  HPI     Past Medical History:  Diagnosis Date   Alcohol dependence ([place])    Arthritis    hips, hands   Bilateral carpal tunnel syndrome    Bilateral leg edema    Chronic   CHF (congestive heart failure) ([place])    Diabetes mellitus without complication ([place])    type 2   Diverticulitis    portion of colon removed   DOE (dyspnea on exertion)    occ   Elevated liver enzymes    Fatty liver    GERD (gastroesophageal reflux disease)    occ   Hammer toe    Hip pain    History of ventral hernia repair 2016   x2   Hyperlipidemia    pt unsure   Hypertension    Marijuana abuse    Morbid obesity ([place])    Neuromuscular disorder ([place])    peripheral neuropathy feet and few fingers   OSA (obstructive sleep apnea)    has OSA-not used CPAP 2-3 yrs could not tolerate cpap   PONV (postoperative nausea and vomiting)    Toe ulcer ([place])    left healed    Patient Active Problem List   Diagnosis Date Noted   Alcohol abuse 04/28/2021   Diabetic foot ulcer ([place])  04/28/2021   Sepsis ([place]) 04/28/2021   Osteomyelitis ([place]) 04/27/2021   Anxiety 03/17/2021   Amputated toe of right foot ([place]) 38/88/2800   Alcoholic peripheral neuropathy ([place]) 10/28/2019   Chronic diastolic CHF (congestive heart failure) ([place]) 10/15/2019   Chronic foot ulcer ([place]) 34/91/7915   Alcoholic cirrhosis of liver without ascites ([place]) 04/27/2019   Obesity hypoventilation syndrome ([place]) 04/27/2019   Depression 04/25/2019   Severe alcohol use disorder ([place])    DOE (dyspnea on exertion) 04/22/2019   Hypothyroidism 04/22/2019   Anemia 02/06/2019   Acute on chronic diastolic CHF (congestive heart failure) ([place]) 01/30/2019   Avascular necrosis of hip, left ([place]) 11/02/2018   Decreased hearing of both ears 10/30/2018   Avascular necrosis of hip, right ([place]) 08/16/2018   Bilateral leg edema 02/27/2018   Marijuana abuse 02/16/2018   Ulcer of left heel ([place]) 12/21/2016   Bilateral carpal tunnel syndrome 12/15/2016   Hyperlipidemia 11/25/2016   Prediabetes 11/25/2016   Hypertension 11/23/2016   History of osteomyelitis 11/23/2016   Morbid obesity ([place]) 11/23/2016   OSA (obstructive sleep apnea) 11/23/2016   Other hammer toe (acquired) 11/11/2013   Exstrophy of bladder 11/11/2013    Past Surgical History:  Procedure Laterality Date   AMPUTATION TOE Left 05/25/2018   Procedure: AMPUTATION TOE left 3rd;  Surgeon: Wylene Simmer, MD;  Location: [facility];  Service: Orthopedics;  Laterality: Left;  64mn, to follow   AMPUTATION TOE Left 06/28/2018   Procedure: Left foot revision 3rd toe amputation including 3rd metatarsal;  Surgeon: HWylene Simmer MD;  [facility]  Service: Orthopedics;  Laterality: Left;  611m   BICEPS TENDON REPAIR Left 2014   Partial   COLONOSCOPY     GRAFT APPLICATION Right 02/17/22/5573 Procedure: GRAFT APPLICATION;  Surgeon: StLandis MartinsDPM;  [facility] Service: Podiatry;  Laterality: Right;    HERNIA REPAIR  2016   ventral   HIP CLOSED REDUCTION Right 09/01/2018   Procedure: CLOSED REDUCTION HIP;  Surgeon: RoNicholes StairsMD;  Location: [facility];  Service: Orthopedics;  Laterality: Right;   INCISION AND DRAINAGE OF WOUND Right 03/03/2020   Procedure: IRRIGATION AND DEBRIDEMENT WOUND;  Surgeon: StLandis MartinsDPM;  [facility] Service: Podiatry;  Laterality: Right;   JOINT REPLACEMENT     b/l knees    METATARSAL HEAD EXCISION Right 03/03/2020   Procedure: IRRIGATION OF TOE AND CAUTERIZATION OF BLEEDING TOE;  Surgeon: StLandis MartinsDPM;  [facility] Service: Podiatry;  Laterality: Right;   METATARSAL HEAD EXCISION Right 03/03/2020   Procedure: METATARSAL HEAD EXCISION SECOND TOE RIGHT;  Surgeon: StLandis MartinsDPM;  [facility] Service: [facility]iatry;  Laterality: Right;  MAC WITH LOCAL   TOE AMPUTATION Right 09/2013   TOTAL HIP ARTHROPLASTY Right 08/16/2018   Procedure: TOTAL HIP ARTHROPLASTY ANTERIOR APPROACH;  Surgeon: SwRod CanMD;  [facility];  Service: Orthopedics;  Laterality: Right;   TOTAL HIP ARTHROPLASTY Left 11/02/2018   Procedure: TOTAL HIP ARTHROPLASTY ANTERIOR APPROACH;  Surgeon: SwRod CanMD;  [facility];  Service: Orthopedics;  Laterality: Left;   TOTAL KNEE ARTHROPLASTY     bilat       Family History  Adopted: Yes  Family history unknown: Yes    Social History   Tobacco Use   Smoking status: Former    Packs/day: 0.25    Years: 10.00    Pack years: 2.50    Types: Cigarettes   Smokeless tobacco: Never   Tobacco comments:    quit 2018  Vaping Use   Vaping Use: Never used  Substance Use Topics   Alcohol use: Not Currently    Comment: 2-3 drinks 4-5 nights a week, liquor   Drug use: Yes    Frequency: 1.0 times per week    Types: Marijuana    Comment: "4 times a month, maybe"    Home Medications Prior to Admission medications   Medication Sig Start Date  End Date Taking? Authorizing Provider  acamprosate (CAMPRAL) 333 MG tablet Take 333 mg by mouth 2 (two) times daily. 04/01/21  Yes [provider]  cloNIDine (CATAPRES) 0.1 MG tablet Take 0.1 mg by mouth every 6 (six) hours as needed (tremors/withdrawal symptoms). 03/10/21  Yes [provider]  disulfiram (ANTABUSE) 250 MG tablet Take 250 mg by mouth daily. 03/10/21  Yes [provider]  docusate sodium (COLACE) 100 MG capsule TAKE 1 CAPSULE (100 MG TOTAL) BY MOUTH DAILY AS NEEDED. Patient taking differently: Take 100 mg by mouth daily as needed for mild constipation. 10/26/20 10/26/21 Yes Stover, Titorya, DPM  DULoxetine (CYMBALTA) 20 MG capsule TAKE 1 CAPSULE  BY MOUTH EVERY DAY Patient taking differently: Take 20 mg by mouth daily. 04/23/21  Yes Burns, Claudina Lick, MD  furosemide (LASIX) 40 MG tablet TAKE 1 TABLET BY MOUTH TWICE A DAY Patient taking differently: Take 40 mg by mouth 2 (two) times daily. 04/13/21  Yes Burns, Claudina Lick, MD  gabapentin (NEURONTIN) 100 MG capsule Take 1 capsule (100 mg total) by mouth 3 (three) times daily. 11/26/20  Yes Burns, Claudina Lick, MD  HEMP OIL-VANILLYL BUTYL ETHER EX Apply 1 application topically 2 (two) times daily as needed (arthritis pain).   Yes [provider]  hydrOXYzine (ATARAX/VISTARIL) 25 MG tablet Take 25 mg by mouth every 6 (six) hours as needed for anxiety. 03/10/21  Yes [provider]  levothyroxine (SYNTHROID) 50 MCG tablet Take 50 mcg by mouth daily before breakfast.   Yes [provider]  naltrexone (DEPADE) 50 MG tablet Take 50 mg by mouth daily.   Yes [provider]  ondansetron (ZOFRAN) 4 MG tablet Take 4 mg by mouth every 8 (eight) hours as needed for nausea or vomiting.   Yes [provider]  potassium chloride SA (KLOR-CON M20) 20 MEQ tablet TAKE 2 TABLETS (40 MEQ TOTAL) BY MOUTH 3 (THREE) TIMES DAILY. Patient taking differently: Take 60 mEq by mouth 2 (two) times daily. 11/25/20  Yes  Burns, Claudina Lick, MD  traZODone (DESYREL) 50 MG tablet TAKE 1-2 TABLETS (50-100 MG TOTAL) BY MOUTH AT BEDTIME AS NEEDED FOR SLEEP. Patient taking differently: Take 50 mg by mouth at bedtime as needed for sleep. 12/31/19  Yes Burns, Claudina Lick, MD  vitamin B-12 (CYANOCOBALAMIN) 100 MCG tablet Take 100 mcg by mouth daily.   Yes [provider]    Allergies    Claritin [loratadine]  Review of Systems   Review of Systems  Constitutional:  Negative for chills and fever.  Respiratory:  Negative for cough and shortness of breath.   Cardiovascular:  Positive for leg swelling. Negative for chest pain and palpitations.  Gastrointestinal:  Negative for abdominal pain and vomiting.  Musculoskeletal:  Positive for arthralgias and myalgias.  Skin:  Positive for color change, rash and wound.  Neurological:  Negative for syncope and headaches.  All other systems reviewed and are negative.  Physical Exam Updated Vital Signs BP (!) 146/72   Pulse (!) 118   Temp (!) 97.5 F (36.4 C) (Oral)   Resp 20   Ht _0  (1.854 m)   Wt 127 kg   SpO2 95%   BMI 36.94 kg/m   Physical Exam Constitutional:      General: He is not in acute distress. HENT:     Head: Normocephalic and atraumatic.  Eyes:     Conjunctiva/sclera: Conjunctivae normal.     Pupils: Pupils are equal, round, and reactive to light.  Cardiovascular:     Rate and Rhythm: Normal rate and regular rhythm.  Pulmonary:     Effort: Pulmonary effort is normal. No respiratory distress.     Comments: 92 % on room air Crackles bilaterally Abdominal:     General: There is no distension.     Tenderness: There is no abdominal tenderness.  Musculoskeletal:     Comments: Bilateral lymphedema of the lower extremities, L > R Diabetic foot ulcer as noted in photo - pink granulation tissue, no obvious purulence to wound, warmth of surrounding foot +4 edema of lower extremity, no crepitus on exam  Skin:    General: Skin is warm and dry.  Neurological:     General: No focal deficit present.     Mental Status: He is alert. Mental status is at baseline.     Comments: Chronic neuropathy in b/l lower extremities (unchanged)  Psychiatric:        Mood and Affect: Mood normal.        Behavior: Behavior normal.           ED Results / Procedures / Treatments   Labs (all labs ordered are listed, but only abnormal results are displayed) Labs Reviewed  BASIC METABOLIC PANEL - Abnormal; Notable for the following components:      Result Value   Sodium 132 (*)    Potassium 3.0 (*)    Chloride 92 (*)    Glucose, Bld 156 (*)    Calcium 8.4 (*)    Anion gap 17 (*)    All other components within normal limits  CBC WITH DIFFERENTIAL/PLATELET - Abnormal; Notable for the following components:   WBC 15.9 (*)    RBC 3.57 (*)    Hemoglobin 11.6 (*)    HCT 34.0 (*)    RDW 15.6 (*)    Neutro Abs 12.2 (*)    Monocytes Absolute 1.3 (*)    Abs Immature Granulocytes 1.14 (*)    All other components within normal limits  C-REACTIVE PROTEIN - Abnormal; Notable for the following components:   CRP 33.5 (*)    All other components within normal limits  SEDIMENTATION RATE - Abnormal; Notable for the following components:   Sed Rate 85 (*)    All other components within normal limits  LACTIC ACID, PLASMA - Abnormal; Notable for the following components:   Lactic Acid, Venous 4.0 (*)    All other components within normal limits  HEMOGLOBIN A1C - Abnormal; Notable for the following components:   Hgb A1c MFr Bld 6.6 (*)    All other components within normal limits  LACTIC ACID, PLASMA - Abnormal; Notable for the following components:   Lactic Acid, Venous 3.6 (*)    All other components within normal limits  LACTIC ACID, PLASMA - Abnormal; Notable for the following components:   Lactic Acid, Venous 3.6 (*)    All other components within normal limits  PREALBUMIN - Abnormal; Notable for the following components:   Prealbumin <5 (*)     All other components within normal limits  URINALYSIS, ROUTINE W REFLEX MICROSCOPIC - Abnormal; Notable for the following components:   Color, Urine AMBER (*)    Hgb urine dipstick SMALL (*)    All other components within normal limits  OSMOLALITY - Abnormal; Notable for the following components:   Osmolality 311 (*)    All other components within normal limits  MAGNESIUM - Abnormal; Notable for the following components:   Magnesium 1.3 (*)    All other components within normal limits  COMPREHENSIVE METABOLIC PANEL - Abnormal; Notable for the following components:   Sodium 132 (*)    Potassium 3.3 (*)    Chloride 93 (*)    Glucose, Bld 135 (*)    Calcium 8.2 (*)    Albumin 2.4 (*)    Alkaline Phosphatase 147 (*)    Total Bilirubin 3.7 (*)    All other components within normal limits  CK - Abnormal; Notable for the following components:   Total CK 26 (*)    All other components within normal limits  MAGNESIUM - Abnormal; Notable for the following components:   Magnesium 1.2 (*)  All other components within normal limits  PROTIME-INR - Abnormal; Notable for the following components:   Prothrombin Time 16.4 (*)    INR 1.3 (*)    All other components within normal limits  CBC WITH DIFFERENTIAL/PLATELET - Abnormal; Notable for the following components:   WBC 12.1 (*)    RBC 3.31 (*)    Hemoglobin 10.6 (*)    HCT 31.7 (*)    RDW 15.7 (*)    Platelets 147 (*)    Neutro Abs 9.8 (*)    Abs Immature Granulocytes 0.46 (*)    All other components within normal limits  COMPREHENSIVE METABOLIC PANEL - Abnormal; Notable for the following components:   Sodium 132 (*)    Potassium 3.3 (*)    Chloride 95 (*)    Glucose, Bld 127 (*)    Calcium 7.8 (*)    Albumin 2.3 (*)    Alkaline Phosphatase 150 (*)    Total Bilirubin 3.8 (*)    All other components within normal limits  CBG MONITORING, ED - Abnormal; Notable for the following components:   Glucose-Capillary 141 (*)    All  other components within normal limits  CBG MONITORING, ED - Abnormal; Notable for the following components:   Glucose-Capillary 132 (*)    All other components within normal limits  CBG MONITORING, ED - Abnormal; Notable for the following components:   Glucose-Capillary 133 (*)    All other components within normal limits  RESP PANEL BY RT-PCR (FLU A&B, COVID) ARPGX2  CULTURE, BLOOD (ROUTINE X 2)  CULTURE, BLOOD (ROUTINE X 2)  PROCALCITONIN  CREATININE, URINE, RANDOM  OSMOLALITY, URINE  SODIUM, URINE, RANDOM  CK  PHOSPHORUS  PHOSPHORUS  TSH  LACTIC ACID, PLASMA  HIV ANTIBODY (ROUTINE TESTING W REFLEX)    EKG None  Radiology DG CHEST PORT 1 VIEW  Result Date: 04/28/2021 CLINICAL DATA:  Diabetic foot infection, history of CHF, diabetes, hypertension, former smoker EXAM: PORTABLE CHEST 1 VIEW COMPARISON:  Radiograph 11/24/2020 FINDINGS: No consolidation, features of edema, pneumothorax, or effusion. Pulmonary vascularity is normally distributed. The cardiomediastinal contours are unremarkable for the portable technique. No acute osseous or soft tissue abnormality. IMPRESSION: No acute cardiopulmonary abnormality. Electronically Signed   By: Lovena Le M.D.   On: 04/28/2021 00:24   DG Foot Complete Left  Result Date: 04/27/2021 CLINICAL DATA:  Diabetic foot ulcers with soft tissue swelling erythema, initial encounter EXAM: LEFT FOOT - COMPLETE 3+ VIEW COMPARISON:  10/15/2019 FINDINGS: Generalized soft tissue swelling is noted about the ankle and foot. Changes of prior amputation in the midportion of the third metatarsal are seen. Large soft tissue ulcer is noted along the plantar aspect of the foot near the base of the metatarsals. Mottled density is noted within the proximal aspect of the fifth metatarsal suspicious for osteomyelitis. Degenerative changes of the first MTP joint are seen. No acute fracture is noted. IMPRESSION: Considerable soft tissue swelling with plantar diabetic  foot ulcer. Some mottled appearance to the base of the fifth metatarsal is seen which may represent some underlying osteomyelitis. Nonemergent MRI may be helpful for further evaluation. Electronically Signed   By: Inez Catalina M.D.   On: 04/27/2021 21:22    Procedures Procedures   Medications Ordered in ED Medications  metroNIDAZOLE (FLAGYL) IVPB 500 mg (0 mg Intravenous Stopped 04/28/21 0154)  oxyCODONE-acetaminophen (PERCOCET/ROXICET) 5-325 MG per tablet 1 tablet (has no administration in time range)  insulin aspart (novoLOG) injection 0-9 Units (0 Units Subcutaneous Not Given  04/28/21 0747)  gabapentin (NEURONTIN) capsule 100 mg (has no administration in time range)  hydrOXYzine (ATARAX/VISTARIL) tablet 25 mg (has no administration in time range)  traZODone (DESYREL) tablet 50 mg (has no administration in time range)  acetaminophen (TYLENOL) tablet 650 mg (has no administration in time range)    Or  acetaminophen (TYLENOL) suppository 650 mg (has no administration in time range)  HYDROcodone-acetaminophen (NORCO/VICODIN) 5-325 MG per tablet 1-2 tablet (has no administration in time range)  sodium chloride flush (NS) 0.9 % injection 3 mL (has no administration in time range)  sodium chloride flush (NS) 0.9 % injection 3 mL (has no administration in time range)  0.9 %  sodium chloride infusion (has no administration in time range)  LORazepam (ATIVAN) tablet 1-4 mg ( Oral See Alternative 04/28/21 0655)    Or  LORazepam (ATIVAN) injection 1-4 mg (1 mg Intravenous Given 04/28/21 0655)  thiamine tablet 100 mg (has no administration in time range)    Or  thiamine (B-1) injection 100 mg (has no administration in time range)  folic acid (FOLVITE) tablet 1 mg (has no administration in time range)  multivitamin with minerals tablet 1 tablet (has no administration in time range)  lactated ringers infusion ( Intravenous New Bag/Given 04/28/21 0407)  vancomycin (VANCOREADY) IVPB 1500 mg/300 mL (has no  administration in time range)  ceFEPIme (MAXIPIME) 2 g in sodium chloride 0.9 % 100 mL IVPB (2 g Intravenous New Bag/Given 04/28/21 0744)  levothyroxine (SYNTHROID) tablet 50 mcg (50 mcg Oral Given 04/28/21 0521)  magnesium sulfate IVPB 4 g 100 mL (4 g Intravenous New Bag/Given 04/28/21 0819)  potassium chloride 10 mEq in 100 mL IVPB (has no administration in time range)  vancomycin (VANCOREADY) IVPB 2000 mg/400 mL (0 mg Intravenous Stopped 04/28/21 0357)  potassium chloride 10 mEq in 100 mL IVPB (0 mEq Intravenous Stopped 04/28/21 0521)    ED Course  I have reviewed the triage vital signs and the nursing notes.  Pertinent labs & imaging results that were available during my care of the patient were reviewed by me and considered in my medical decision making (see chart for details).  Diabetic foot ulcer, left foot Patient sent in by orthopedist with concern for worsening infection/osteomyelitis and recommendation for IV antibiotics  WBC 15.9 on arrival, afebrile but tachycardic.  ESR elevated.  Xrays reviewed with concern for possible underlying bone infection.  Lactate pending.  IV antibiotics ordered, blood cx ordered - empiric coverage for osteomyelitis with MRSA coverage (he reports hx of MRSA infection in other leg). BP and vitals otherwise stable here Hx of CHF - chronic hypoxia, but no new oxygen requirement.  Doubt CHF exacerbation at this time  Plan for medical admission  Clinical Course as of 04/28/21 0941  Mon Apr 27, 2021  2125 IMPRESSION: Considerable soft tissue swelling with plantar diabetic foot ulcer. Some mottled appearance to the base of the fifth metatarsal is seen which may represent some underlying osteomyelitis. Nonemergent MRI may be helpful for further evaluation. [MT]  2244 Blood work is consistent with an infection, possible osteomyelitis.  Have ordered antibiotics and blood cultures.  The patient was updated in agreement with hospitalization.  Because of his  history of significant distal heart failure, I am holding off on fluids at this time.  BP normal - no evidence of shock. [MT]  2326 Admitted to hospitalist. [MT]    Clinical Course User Index [MT] Langston Masker Carola Rhine, MD    Final Clinical Impression(s) / ED Diagnoses  Final diagnoses:  Diabetic ulcer of left midfoot associated with type 2 diabetes mellitus, unspecified ulcer stage ([place])  Foot infection    Rx / DC Orders ED Discharge Orders     None        Wyvonnia Dusky, MD 04/28/21 [phone]

## 2021-04-27 NOTE — H&P (Addendum)
[name]:[id number] DOB: 12-15-1960 DOA: 04/27/2021     PCP: Binnie Rail, MD   Outpatient Specialists:   Podiatrist at [facility] Dr. Lyla Glassing [facility] Dr. Doran Durand  Patient arrived to ER on 04/27/21 at [place] Referred by Attending Trifan, Carola Rhine, MD   Patient coming from: home Lives     With family    Chief Complaint:   Chief Complaint  Patient presents with   Left Foot Infection    HPI: [name] is a 60 y.o. male with medical history significant of DM2, HTN, CHF, alcohol abuse, multiple joint due to placement, GERD, HLD, morbid obesity, OSA    Presented with left foot infection Has chronic infection of the left foot for years with un ulcer of the bottom of the foot He has been dressing changing every day.  Swelling has gotten worse. He has been seen in the office today and was sent to emergency department for IV antibiotics Now more swollen with swelling and redness travel up the leg Have seen a number of providers for it some local [facility] Tries not to walk on the foot Reports he drinks but has being trying to quit Reports history of alcohol withdrawal.   Has  been vaccinated against COVID  and boosted   Initial COVID TEST  NEGATIVE   Lab Results  Component Value Date   [place] 04/28/2021   [place] NEGATIVE 05/29/2020   [place] NEGATIVE 03/03/2020   [place] NEGATIVE 02/28/2020     Regarding pertinent Chronic problems:   Hyperlipidemia -  not on   statins Lipid Panel     Component Value Date/Time   CHOL 168 03/17/2021 1602   TRIG 113.0 03/17/2021 1602   HDL 42.90 03/17/2021 1602   CHOLHDL 4 03/17/2021 1602   VLDL 22.6 03/17/2021 1602   LDLCALC 103 (H) 03/17/2021 1602     HTN on clonidine not compliant   chronic CHF diastolic/ - last echo 4034 On Lasix    DM 2 -  Lab Results  Component Value Date   HGBA1C 5.7 03/17/2021   diet controlled  Hypothyroidism:  Lab Results   Component Value Date   TSH 4.26 03/17/2021   on synthroid   Morbid obesity-   BMI Readings from Last 1 Encounters:  04/27/21 36.94 kg/m      OSA  noncompliant with CPAP   While in ER: Ordered vancomycin and Flagyl ceftriaxone Left foot :Considerable soft tissue swelling with plantar diabetic foot ulcer. Some mottled appearance to the base of the fifth metatarsal is seen which may represent some underlying osteomyelitis. Nonemergent MRI may be helpful for further evaluation.    ED Triage Vitals  Enc Vitals Group     BP 04/27/21 2000 (!) 147/112     Pulse Rate 04/27/21 2000 (!) 128     Resp 04/27/21 2000 12     Temp 04/27/21 1950 98.8 F (37.1 C)     Temp Source 04/27/21 1950 Oral     SpO2 04/27/21 2000 93 %     Weight 04/27/21 1950 280 lb (127 kg)     Height 04/27/21 1950 _0  (1.854 m)     Head Circumference --      Peak Flow --      Pain Score 04/27/21 1950 6     Pain Loc --      Pain Edu? --      Excl. in [place]? --   VQQV(95)@  _________________________________________ Significant initial  Findings: Abnormal Labs Reviewed  BASIC METABOLIC PANEL - Abnormal; Notable for the following components:      Result Value   Sodium 132 (*)    Potassium 3.0 (*)    Chloride 92 (*)    Glucose, Bld 156 (*)    Calcium 8.4 (*)    Anion gap 17 (*)    All other components within normal limits  CBC WITH DIFFERENTIAL/PLATELET - Abnormal; Notable for the following components:   WBC 15.9 (*)    RBC 3.57 (*)    Hemoglobin 11.6 (*)    HCT 34.0 (*)    RDW 15.6 (*)    Neutro Abs 12.2 (*)    Monocytes Absolute 1.3 (*)    Abs Immature Granulocytes 1.14 (*)    All other components within normal limits  C-REACTIVE PROTEIN - Abnormal; Notable for the following components:   CRP 33.5 (*)    All other components within normal limits  SEDIMENTATION RATE - Abnormal; Notable for the following components:   Sed Rate 85 (*)    All other components within normal limits    ____________________________________________ Ordered Left foot possible osteomyelitis _________________________   ECG: Ordered Personally reviewed by me showing: HR : 115 Rhythm:  Sinus tachycardia    no evidence of ischemic changes QTC 476 ____________________ This patient meets SIRS Criteria and may be septic.   The recent clinical data is shown below. Vitals:   04/27/21 1950 04/27/21 2000 04/27/21 2155 04/27/21 2230  BP:  (!) 147/112 121/71 (!) 149/72  Pulse:  (!) 128 (!) 114 (!) 118  Resp:  _0 Temp: 98.8 F (37.1 C)     TempSrc: Oral     SpO2:  93% 91% 96%  Weight: 127 kg     Height: _1  (1.854 m)        WBC     Component Value Date/Time   WBC 15.9 (H) 04/27/2021 2130   LYMPHSABS 1.3 04/27/2021 2130   MONOABS 1.3 (H) 04/27/2021 2130   EOSABS 0.0 04/27/2021 2130   BASOSABS 0.0 04/27/2021 2130   Lactic Acid, Venous    Component Value Date/Time   LATICACIDVEN 4.0 (HH) 04/27/2021 2253   Procalcitonin  Ordered     Results for orders placed or performed during the hospital encounter of 04/27/21  Resp Panel by RT-PCR (Flu A&B, Covid) Nasopharyngeal Swab     Status: None   Collection Time: 04/28/21 12:00 AM   Specimen: Nasopharyngeal Swab; Nasopharyngeal(NP) swabs in vial transport medium  Result Value Ref Range Status   SARS Coronavirus 2 by RT PCR NEGATIVE NEGATIVE Final         Influenza A by PCR NEGATIVE NEGATIVE Final   Influenza B by PCR NEGATIVE NEGATIVE Final           _______________________________________________________ ER Provider Called: Orthopedics    Dr. Alvan Dame They Recommend admit to medicine to [facility] Dr. Sharol Given will see in the a.m. Will see in AM    _______________________________________________ Hospitalist was called for admission for osteomyelitis of left foot.  Wound infection and sepsis  The following Work up has been ordered so far:  Orders Placed This Encounter  Procedures   Blood Cultures x 2 sites   Resp Panel  by RT-PCR (Flu A&B, Covid) Nasopharyngeal Swab   DG Foot Complete Left   Basic metabolic panel   CBC with Differential   C-reactive protein   Sedimentation rate   Lactic acid  Initiate Carrier Fluid Protocol   vancomycin per pharmacy consult   Consult to hospitalist   Insert peripheral IV     Following Medications were ordered in ER: Medications  cefTRIAXone (ROCEPHIN) 2 g in sodium chloride 0.9 % 100 mL IVPB (has no administration in time range)  metroNIDAZOLE (FLAGYL) IVPB 500 mg (has no administration in time range)  vancomycin (VANCOREADY) IVPB 2000 mg/400 mL (has no administration in time range)  oxyCODONE-acetaminophen (PERCOCET/ROXICET) 5-325 MG per tablet 1 tablet (has no administration in time range)        Consult Orders  (From admission, onward)           Start     Ordered   04/27/21 2303  Consult to hospitalist  Once       Provider:  (Not yet assigned)  Question Answer Comment  Place call to: [facility]   Reason for Consult Admit      04/27/21 2302               OTHER Significant initial  Findings:  labs showing:    Recent Labs  Lab 04/27/21 2130  NA 132*  K 3.0*  CO2 23  GLUCOSE 156*  BUN 10  CREATININE 0.78  CALCIUM 8.4*    Cr  stable,    Lab Results  Component Value Date   CREATININE 0.78 04/27/2021   CREATININE 0.95 03/17/2021   CREATININE 0.66 11/24/2020    No results for input(s): AST, ALT, ALKPHOS, BILITOT, PROT, ALBUMIN in the last 168 hours. Lab Results  Component Value Date   CALCIUM 8.4 (L) 04/27/2021   PHOS 3.3 03/04/2020       Plt: Lab Results  Component Value Date   PLT 154 04/27/2021      COVID-19 Labs  Recent Labs    04/27/21 2130  CRP 33.5*    Lab Results  Component Value Date   SARSCOV2NAA NEGATIVE 05/29/2020   SARSCOV2NAA NEGATIVE 03/03/2020   SARSCOV2NAA NEGATIVE 02/28/2020   [place] NEGATIVE 10/15/2019        Recent Labs  Lab 04/27/21 2130  WBC 15.9*  NEUTROABS 12.2*   HGB 11.6*  HCT 34.0*  MCV 95.2  PLT 154    HG/HCT  stable,       Component Value Date/Time   HGB 11.6 (L) 04/27/2021 2130   HCT 34.0 (L) 04/27/2021 2130   HCT 31.0 (L) 04/26/2019 1007   MCV 95.2 04/27/2021 2130         BNP (last 3 results) Recent Labs    05/29/20 0018  BNP 36.6    DM  labs:  HbA1C: Recent Labs    11/24/20 1343 03/17/21 1602  HGBA1C 6.5 5.7       CBG (last 3)  No results for input(s): GLUCAP in the last 72 hours.        Cultures:    Component Value Date/Time   SDES  12/12/2019 1635    WOUND LEFT PLANTAR FOOT Performed at [facility], [place] [address], [place], [place] [address]    SPECREQUEST  12/12/2019 1635    NONE Performed at [facility], [place] [address] [place], [place] [address]    CULT  12/12/2019 1635    RARE NORMAL SKIN FLORA Performed at [facility], [place] [address], [place], [place] [address]    REPTSTATUS 12/15/2019 FINAL 12/12/2019 1635     Radiological Exams on Admission: DG Foot Complete Left  Result Date: 04/27/2021 CLINICAL DATA:  Diabetic foot ulcers with soft tissue swelling erythema, initial encounter EXAM: LEFT FOOT - COMPLETE 3+ VIEW COMPARISON:  10/15/2019 FINDINGS: Generalized soft tissue swelling is noted about the ankle and foot. Changes of prior amputation in the midportion of the third metatarsal are seen. Large soft tissue ulcer is noted along the plantar aspect of the foot near the base of the metatarsals. Mottled density is noted within the proximal aspect of the fifth metatarsal suspicious for osteomyelitis. Degenerative changes of the first MTP joint are seen. No acute fracture is noted. IMPRESSION: Considerable soft tissue swelling with plantar diabetic foot ulcer. Some mottled appearance to the base of the fifth metatarsal is seen which may represent some underlying osteomyelitis. Nonemergent MRI may be helpful for further evaluation. Electronically Signed   By:  Inez Catalina M.D.   On: 04/27/2021 21:22   _______________________________________________________________________________________________________ Latest  Blood pressure (!) 149/72, pulse (!) 118, temperature 98.8 F (37.1 C), temperature source Oral, resp. rate 17, height _0  (1.854 m), weight 127 kg, SpO2 96 %.   Review of Systems:    Pertinent positives include:  chills, fatigue,  Constitutional:  No weight loss, night sweats, Fevers, weight loss  HEENT:  No headaches, Difficulty swallowing,Tooth/dental problems,Sore throat,  No sneezing, itching, ear ache, nasal congestion, post nasal drip,  Cardio-vascular:  No chest pain, Orthopnea, PND, anasarca, dizziness, palpitations.no Bilateral lower extremity swelling  GI:  No heartburn, indigestion, abdominal pain, nausea, vomiting, diarrhea, change in bowel habits, loss of appetite, melena, blood in stool, hematemesis Resp:  no shortness of breath at rest. No dyspnea on exertion, No excess mucus, no productive cough, No non-productive cough, No coughing up of blood.No change in color of mucus.No wheezing. Skin:  no rash or lesions. No jaundice GU:  no dysuria, change in color of urine, no urgency or frequency. No straining to urinate.  No flank pain.  Musculoskeletal:  No joint pain or no joint swelling. No decreased range of motion. No back pain.  Psych:  No change in mood or affect. No depression or anxiety. No memory loss.  Neuro: no localizing neurological complaints, no tingling, no weakness, no double vision, no gait abnormality, no slurred speech, no confusion  All systems reviewed and apart from [place] all are negative _______________________________________________________________________________________________ Past Medical History:   Past Medical History:  Diagnosis Date   Alcohol dependence ([place])    Arthritis    hips, hands   Bilateral carpal tunnel syndrome    Bilateral leg edema    Chronic   CHF (congestive  heart failure) (HCC)    Diabetes mellitus without complication ([place])    type 2   Diverticulitis    portion of colon removed   DOE (dyspnea on exertion)    occ   Elevated liver enzymes    Fatty liver    GERD (gastroesophageal reflux disease)    occ   Hammer toe    Hip pain    History of ventral hernia repair 2016   x2   Hyperlipidemia    pt unsure   Hypertension    Marijuana abuse    Morbid obesity ([place])    Neuromuscular disorder ([place])    peripheral neuropathy feet and few fingers   OSA (obstructive sleep apnea)    has OSA-not used CPAP 2-3 yrs could not tolerate cpap   PONV (postoperative nausea and vomiting)    Toe ulcer ([place])    left healed      Past Surgical History:  Procedure Laterality Date  AMPUTATION TOE Left 05/25/2018   Procedure: AMPUTATION TOE left 3rd;  Surgeon: Wylene Simmer, MD;  Location: [place];  Service: Orthopedics;  Laterality: Left;  21mn, to follow   AMPUTATION TOE Left 06/28/2018   Procedure: Left foot revision 3rd toe amputation including 3rd metatarsal;  Surgeon: HWylene Simmer MD;  [place] [facility]  Service: Orthopedics;  Laterality: Left;  611m   BICEPS TENDON REPAIR Left 2014   Partial   COLONOSCOPY     GRAFT APPLICATION Right 02/17/53/6568 Procedure: GRAFT APPLICATION;  Surgeon: StLandis MartinsDPM;  [facility] Service: Podiatry;  Laterality: Right;   HERNIA REPAIR  2016   ventral   HIP CLOSED REDUCTION Right 09/01/2018   Procedure: CLOSED REDUCTION HIP;  Surgeon: RoNicholes StairsMD;  [facility];  Service: Orthopedics;  Laterality: Right;   INCISION AND DRAINAGE OF WOUND Right 03/03/2020   Procedure: IRRIGATION AND DEBRIDEMENT WOUND;  Surgeon: StLandis MartinsDPM;  [facility] Service: Podiatry;  Laterality: Right;   JOINT REPLACEMENT     b/l knees    METATARSAL HEAD EXCISION Right 03/03/2020   Procedure: IRRIGATION OF TOE AND  CAUTERIZATION OF BLEEDING TOE;  Surgeon: StLandis MartinsDPM;  [facility] Service: Podiatry;  Laterality: Right;   METATARSAL HEAD EXCISION Right 03/03/2020   Procedure: METATARSAL HEAD EXCISION SECOND TOE RIGHT;  Surgeon: StLandis MartinsDPM;  [place] [facility] Service: Podiatry;  Laterality: Right;  MAC WITH LOCAL   TOE AMPUTATION Right 09/2013   TOTAL HIP ARTHROPLASTY Right 08/16/2018   Procedure: TOTAL HIP ARTHROPLASTY ANTERIOR APPROACH;  Surgeon: SwRod CanMD;  [facility];  Service: Orthopedics;  Laterality: Right;   TOTAL HIP ARTHROPLASTY Left 11/02/2018   Procedure: TOTAL HIP ARTHROPLASTY ANTERIOR APPROACH;  Surgeon: SwRod CanMD;  [facility];  Service: Orthopedics;  Laterality: Left;   TOTAL KNEE ARTHROPLASTY     bilat    Social History:  Ambulatory   cane,      reports that he has quit smoking. His smoking use included cigarettes. He has a 2.50 pack-year smoking history. He has never used smokeless tobacco. He reports previous alcohol use. He reports current drug use. Frequency: 1.00 time per week. Drug: Marijuana.    Family History:   Family History  Adopted: Yes  Family history unknown: Yes   _________________________________________________________________________________________ Allergies: Allergies  Allergen Reactions   Claritin [Loratadine] Shortness Of Breath and Anxiety     Prior to Admission medications   Medication Sig Start Date End Date Taking? Authorizing Provider  acamprosate (CAMPRAL) 333 MG tablet Take 333 mg by mouth 2 (two) times daily. 04/01/21  Yes [provider]  cloNIDine (CATAPRES) 0.1 MG tablet Take 0.1 mg by mouth every 6 (six) hours as needed (tremors/withdrawal symptoms). 03/10/21  Yes [provider]  disulfiram (ANTABUSE) 250 MG tablet Take 250 mg by mouth daily. 03/10/21  Yes [provider]  docusate sodium (COLACE) 100 MG capsule TAKE 1 CAPSULE (100 MG TOTAL) BY  MOUTH DAILY AS NEEDED. Patient taking differently: Take 100 mg by mouth daily as needed for mild constipation. 10/26/20 10/26/21 Yes Stover, Titorya, DPM  DULoxetine (CYMBALTA) 20 MG capsule TAKE 1 CAPSULE BY MOUTH EVERY DAY Patient taking differently: Take 20 mg by mouth daily. 04/23/21  Yes Burns, StClaudina LickMD  furosemide (LASIX) 40 MG tablet TAKE 1 TABLET BY MOUTH TWICE A DAY Patient taking differently: Take  40 mg by mouth 2 (two) times daily. 04/13/21  Yes Burns, Claudina Lick, MD  gabapentin (NEURONTIN) 100 MG capsule Take 1 capsule (100 mg total) by mouth 3 (three) times daily. 11/26/20  Yes Burns, Claudina Lick, MD  HEMP OIL-VANILLYL BUTYL ETHER EX Apply 1 application topically 2 (two) times daily as needed (arthritis pain).   Yes [provider]  hydrOXYzine (ATARAX/VISTARIL) 25 MG tablet Take 25 mg by mouth every 6 (six) hours as needed for anxiety. 03/10/21  Yes [provider]  levothyroxine (SYNTHROID) 50 MCG tablet Take 50 mcg by mouth daily before breakfast.   Yes [provider]  naltrexone (DEPADE) 50 MG tablet Take 50 mg by mouth daily.   Yes [provider]  ondansetron (ZOFRAN) 4 MG tablet Take 4 mg by mouth every 8 (eight) hours as needed for nausea or vomiting.   Yes [provider]  potassium chloride SA (KLOR-CON M20) 20 MEQ tablet TAKE 2 TABLETS (40 MEQ TOTAL) BY MOUTH 3 (THREE) TIMES DAILY. Patient taking differently: Take 60 mEq by mouth 2 (two) times daily. 11/25/20  Yes Burns, Claudina Lick, MD  traZODone (DESYREL) 50 MG tablet TAKE 1-2 TABLETS (50-100 MG TOTAL) BY MOUTH AT BEDTIME AS NEEDED FOR SLEEP. Patient taking differently: Take 50 mg by mouth at bedtime as needed for sleep. 12/31/19  Yes Burns, Claudina Lick, MD  vitamin B-12 (CYANOCOBALAMIN) 100 MCG tablet Take 100 mcg by mouth daily.   Yes [provider]    ___________________________________________________________________________________________________ Physical Exam: Vitals with BMI  04/27/2021 04/27/2021 04/27/2021  Height - - -  Weight - - -  BMI - - -  Systolic 622 297 989  Diastolic 72 71 211  Pulse 118 114 128     1. General:  in No Acute distress    Chronically ill  -appearing 2. Psychological: Alert and  Oriented 3. Head/ENT:    Dry Mucous Membranes                          Head Non traumatic, neck supple                         Poor Dentition 4. SKIN: decreased Skin turgor,  Skin clean Dry and intact no rash erythema of left leg of ulcer on the bottom of the foot       5. Heart: Regular rate and rhythm no Murmur, no Rub or gallop 6. Lungs:  Clear to auscultation bilaterally, no wheezes or crackles   7. Abdomen: Soft,  non-tender, Non distended   obese bowel sounds present 8. Lower extremities: no clubbing, cyanosis, L>R edema 9. Neurologically Grossly intact, moving all 4 extremities equally   10. MSK: Normal range of motion     Chart has been reviewed  ______________________________________________________________________________________________  Assessment/Plan 60 y.o. male with medical history significant of DM2, HTN, CHF, alcohol abuse, multiple joint due to placement, GERD, HLD, morbid obesity, OSA    Admitted for osteomyelitis resulting in sepsis  Present on Admission:  Sepsis ([place]) -  -SIRS criteria met with  elevated white blood cell count,       Component Value Date/Time   WBC 15.9 (H) 04/27/2021 2130   LYMPHSABS 1.3 04/27/2021 2130   tachycardia     Today's Vitals   04/28/21 0000 04/28/21 0031 04/28/21 0100 04/28/21 0200  BP: 136/63 136/63 (!) 171/85 (!) 146/62  Pulse: (!) 115 (!) 115 (!) 112 (!)  112  Resp: _0 Temp:      TempSrc:      SpO2: 95% 95% 91% 93%  Weight:      Height:      PainSc:         -Most likely source being:  soft tissue infection,    Patient meeting criteria for Severe sepsis with    evidence of end organ damage/organ dysfunction such as    elevated lactic acid >2     Component Value  Date/Time   LATICACIDVEN 4.0 (HH) 04/27/2021 2253      - Obtain serial lactic acid and procalcitonin level.  - Initiated IV antibiotics   - await results of blood and urine culture  - Rehydrate aggressively       30cc/kg fluid   2:51 AM    Osteomyelitis (HCC) started on IV antibiotics including ceftriaxone and metronidazole and vancomycin Consulted for orthopedics Dr. Ihor Gully is aware of patient to transfer to [facility] to discuss case with Dr. Sharol Given may need operative intervention may need repeat MRI.  It is unclear if patient is interested in operative intervention at this time he would like to have further discussion with orthopedics   Ulcer of left heel ([place]) order ABIs antibiotic coverage evidence of osteomyelitis noted Given significant leg edema we will order Dopplers to rule out DVT   Alcohol abuse -ongoing order CIWA protocol Monitor for any signs of withdrawal   Anemia -mild chronic   Alcoholic peripheral neuropathy (HCC) -contributing to ulcer formation   Hyperlipidemia -chronic stable not on any medication    Hypertension -allow permissive hypertension given sepsis   Hypothyroidism - - Check TSH continue home medications at current dose   Morbid obesity ([place]) -chronic stable will need outpatient follow-up Nutritional consult   OSA (obstructive sleep apnea) -not compliant to CPAP   Prediabetes -  - Order Sensitive SSI     -  check TSH and HgA1C   Elevated lactic acid in the setting of sepsis but also could be significant elevated in the setting of liver disease   Diabetic foot ulcer ([place]) order ABIs IV antibiotics evidence of elevated CRP sed rate and plain imaging worrisome for osteomyelitis orthopedics consult   Alcoholic cirrhosis of liver without ascites ([place]) chronic encourage stopping alcohol abuse.  Last alcohol drink was yesterday.  Obtain c-Met albumin and INR   Other plan as per orders.  DVT prophylaxis:  SCD       Code Status:    Code Status:  Prior FULL CODE  as per patient   I had personally discussed CODE STATUS with patient      Family Communication:   Family not at  Bedside    Disposition Plan:   likely will need placement for rehabilitation                          Following barriers for discharge:                            Electrolytes corrected                               Anemia stable                             Pain controlled with  PO medications                               white count improving able to transition to PO antibiotics                             Will need to be able to tolerate PO                            Will likely need home health, home O2, set up                           Will need consultants to evaluate patient prior to discharge                      Would benefit from PT/OT eval prior to DC  Ordered                                       Diabetes care coordinator                   Transition of care consulted                   Nutrition    consulted                   Consults called: Orthopedics aware  Admission status:  ED Disposition     ED Disposition  [place]: [place] [[id number]]  Level of Care: Progressive [102]  Admit to Progressive based on following criteria: MULTISYSTEM THREATS such as stable sepsis, metabolic/electrolyte imbalance with or without encephalopathy that is responding to early treatment.  May admit patient to [facility] or [facility] if equivalent level of care is available:: No  Covid Evaluation: Asymptomatic Screening Protocol (No Symptoms)  Diagnosis: Osteomyelitis [facility]) [[id number]]  Admitting Physician: Toy Baker [[id number]]  Attending Physician: Toy Baker [[id number]]  Estimated length of stay: past midnight tomorrow  Certification:: I certify this patient will need inpatient services for at least 2 midnights             inpatient     I Expect 2 midnight stay secondary to  severity of patient's current illness need for inpatient interventions justified by the following:  hemodynamic instability despite optimal treatment (tachycardia  )  Severe lab/radiological/exam abnormalities including:   Possible osteomyelitis of left foot and extensive comorbidities including:  substance abuse     DM2   CHF    Morbid Obesity    That are currently affecting medical management.   I expect  patient to be hospitalized for 2 midnights requiring inpatient medical care.  Patient is at high risk for adverse outcome (such as loss of life or disability) if not treated.  Indication for inpatient stay as follows:    severe pain requiring acute inpatient management,  inability to maintain oral hydration    Need for operative/procedural  intervention    Need for IV antibiotics, IV fluids,     Level of care   progressive tele indefinitely please discontinue once patient no longer qualifies COVID-19 Labs  Lab Results  Component Value Date   [place] NEGATIVE 05/29/2020     Precautions: admitted as   Covid Negative    PPE: Used by the provider:   N95  eye Goggles,  Gloves    Lavera Vandermeer 04/27/2021, 2:43 AM    [facility]     after 2 AM please page floor coverage PA If 7AM-7PM, please contact the day team taking care of the patient using [email or web address]   Patient was evaluated in the context of the global COVID-19 pandemic, which necessitated consideration that the patient might be at risk for infection with the SARS-CoV-2 virus that causes COVID-19. Institutional protocols and algorithms that pertain to the evaluation of patients at risk for COVID-19 are in a state of rapid change based on information released by regulatory bodies including the CDC and federal and state organizations. These policies and algorithms were followed during the patient's care.

## 2021-04-27 NOTE — Progress Notes (Addendum)
Pharmacy Antibiotic Note  [name] is a 60 y.o. male admitted on 04/27/2021 with left swollen foot.  Pharmacy has been consulted to dose vancomycin and cefepime for suspected diabetic foot infection with high risk for MRSA.  Plan: Vancomycin 2gm IV x 1 then 1500 q12h (AUC 472.2, Scr 0.78) Cefepime 2gm IV q8h Flagyl 500mg  IV q8h per MD Follow renal function and clinical course  Height: 6\' 1"  (185.4 cm) Weight: 127 kg (280 lb) IBW/kg (Calculated) : 79.9  Temp (24hrs), Avg:98.8 F (37.1 C), Min:98.8 F (37.1 C), Max:98.8 F (37.1 C)  Recent Labs  Lab 04/27/21 2130  WBC 15.9*  CREATININE 0.78    Estimated Creatinine Clearance: 138.8 mL/min (by C-G formula based on SCr of 0.78 mg/dL).    Allergies  Allergen Reactions   Claritin [Loratadine] Shortness Of Breath and Anxiety    Antimicrobials this admission: Vanc 7/12 >> Flagyl 7/12 >> Cefepime 7/12 >>  Dose adjustments this admission:   Microbiology results: 7/11 BCx:    Thank you for allowing pharmacy to be a part of this patient's care.  9/12 RPh 04/27/2021, 10:54 PM

## 2021-04-28 ENCOUNTER — Inpatient Hospital Stay (HOSPITAL_COMMUNITY): Payer: Medicare Other

## 2021-04-28 DIAGNOSIS — R609 Edema, unspecified: Secondary | ICD-10-CM | POA: Diagnosis not present

## 2021-04-28 DIAGNOSIS — E11621 Type 2 diabetes mellitus with foot ulcer: Secondary | ICD-10-CM | POA: Diagnosis not present

## 2021-04-28 DIAGNOSIS — L97426 Non-pressure chronic ulcer of left heel and midfoot with bone involvement without evidence of necrosis: Secondary | ICD-10-CM | POA: Diagnosis not present

## 2021-04-28 DIAGNOSIS — L538 Other specified erythematous conditions: Secondary | ICD-10-CM

## 2021-04-28 DIAGNOSIS — A419 Sepsis, unspecified organism: Secondary | ICD-10-CM | POA: Diagnosis present

## 2021-04-28 DIAGNOSIS — F1010 Alcohol abuse, uncomplicated: Secondary | ICD-10-CM | POA: Diagnosis present

## 2021-04-28 DIAGNOSIS — R6520 Severe sepsis without septic shock: Secondary | ICD-10-CM | POA: Diagnosis not present

## 2021-04-28 LAB — CBC WITH DIFFERENTIAL/PLATELET
Abs Immature Granulocytes: 0.46 10*3/uL — ABNORMAL HIGH (ref 0.00–0.07)
Basophils Absolute: 0 10*3/uL (ref 0.0–0.1)
Basophils Relative: 0 %
Eosinophils Absolute: 0 10*3/uL (ref 0.0–0.5)
Eosinophils Relative: 0 %
HCT: 31.7 % — ABNORMAL LOW (ref 39.0–52.0)
Hemoglobin: 10.6 g/dL — ABNORMAL LOW (ref 13.0–17.0)
Immature Granulocytes: 4 %
Lymphocytes Relative: 8 %
Lymphs Abs: 0.9 10*3/uL (ref 0.7–4.0)
MCH: 32 pg (ref 26.0–34.0)
MCHC: 33.4 g/dL (ref 30.0–36.0)
MCV: 95.8 fL (ref 80.0–100.0)
Monocytes Absolute: 0.9 10*3/uL (ref 0.1–1.0)
Monocytes Relative: 7 %
Neutro Abs: 9.8 10*3/uL — ABNORMAL HIGH (ref 1.7–7.7)
Neutrophils Relative %: 81 %
Platelets: 147 10*3/uL — ABNORMAL LOW (ref 150–400)
RBC: 3.31 MIL/uL — ABNORMAL LOW (ref 4.22–5.81)
RDW: 15.7 % — ABNORMAL HIGH (ref 11.5–15.5)
WBC: 12.1 10*3/uL — ABNORMAL HIGH (ref 4.0–10.5)
nRBC: 0 % (ref 0.0–0.2)

## 2021-04-28 LAB — LACTIC ACID, PLASMA
Lactic Acid, Venous: 4 mmol/L (ref 0.5–1.9)
Lactic Acid, Venous: 3.6 mmol/L (ref 0.5–1.9)
Lactic Acid, Venous: 3.6 mmol/L (ref 0.5–1.9)
Lactic Acid, Venous: 2.3 mmol/L (ref 0.5–1.9)

## 2021-04-28 LAB — COMPREHENSIVE METABOLIC PANEL
ALT: 23 U/L (ref 0–44)
ALT: 22 U/L (ref 0–44)
AST: 39 U/L (ref 15–41)
AST: 35 U/L (ref 15–41)
Albumin: 2.4 g/dL — ABNORMAL LOW (ref 3.5–5.0)
Albumin: 2.3 g/dL — ABNORMAL LOW (ref 3.5–5.0)
Alkaline Phosphatase: 147 U/L — ABNORMAL HIGH (ref 38–126)
Alkaline Phosphatase: 150 U/L — ABNORMAL HIGH (ref 38–126)
Anion gap: 15 (ref 5–15)
Anion gap: 14 (ref 5–15)
BUN: 10 mg/dL (ref 6–20)
BUN: 10 mg/dL (ref 6–20)
CO2: 24 mmol/L (ref 22–32)
CO2: 23 mmol/L (ref 22–32)
Calcium: 8.2 mg/dL — ABNORMAL LOW (ref 8.9–10.3)
Calcium: 7.8 mg/dL — ABNORMAL LOW (ref 8.9–10.3)
Chloride: 93 mmol/L — ABNORMAL LOW (ref 98–111)
Chloride: 95 mmol/L — ABNORMAL LOW (ref 98–111)
Creatinine, Ser: 0.73 mg/dL (ref 0.61–1.24)
Creatinine, Ser: 0.71 mg/dL (ref 0.61–1.24)
GFR, Estimated: >60 mL/min (ref >60)
GFR, Estimated: >60 mL/min (ref >60)
Glucose, Bld: 135 mg/dL — ABNORMAL HIGH (ref 70–99)
Glucose, Bld: 127 mg/dL — ABNORMAL HIGH (ref 70–99)
Potassium: 3.3 mmol/L — ABNORMAL LOW (ref 3.5–5.1)
Potassium: 3.3 mmol/L — ABNORMAL LOW (ref 3.5–5.1)
Sodium: 132 mmol/L — ABNORMAL LOW (ref 135–145)
Sodium: 132 mmol/L — ABNORMAL LOW (ref 135–145)
Total Bilirubin: 3.7 mg/dL — ABNORMAL HIGH (ref 0.3–1.2)
Total Bilirubin: 3.8 mg/dL — ABNORMAL HIGH (ref 0.3–1.2)
Total Protein: 7.7 g/dL (ref 6.5–8.1)
Total Protein: 6.5 g/dL (ref 6.5–8.1)

## 2021-04-28 LAB — GLUCOSE, CAPILLARY
Glucose-Capillary: 123 mg/dL — ABNORMAL HIGH (ref 70–99)
Glucose-Capillary: 116 mg/dL — ABNORMAL HIGH (ref 70–99)

## 2021-04-28 LAB — URINALYSIS, ROUTINE W REFLEX MICROSCOPIC
Bacteria, UA: NONE SEEN
Bilirubin Urine: NEGATIVE
Glucose, UA: NEGATIVE mg/dL
Ketones, ur: NEGATIVE mg/dL
Leukocytes,Ua: NEGATIVE
Nitrite: NEGATIVE
Protein, ur: NEGATIVE mg/dL
Specific Gravity, Urine: 1.012 (ref 1.005–1.030)
pH: 6 (ref 5.0–8.0)

## 2021-04-28 LAB — PHOSPHORUS
Phosphorus: 3.2 mg/dL (ref 2.5–4.6)
Phosphorus: 3.1 mg/dL (ref 2.5–4.6)

## 2021-04-28 LAB — PROTIME-INR
INR: 1.3 — ABNORMAL HIGH (ref 0.8–1.2)
Prothrombin Time: 16.4 seconds — ABNORMAL HIGH (ref 11.4–15.2)

## 2021-04-28 LAB — PROCALCITONIN: Procalcitonin: 1.88 ng/mL

## 2021-04-28 LAB — CBG MONITORING, ED
Glucose-Capillary: 129 mg/dL — ABNORMAL HIGH (ref 70–99)
Glucose-Capillary: 132 mg/dL — ABNORMAL HIGH (ref 70–99)
Glucose-Capillary: 133 mg/dL — ABNORMAL HIGH (ref 70–99)
Glucose-Capillary: 141 mg/dL — ABNORMAL HIGH (ref 70–99)

## 2021-04-28 LAB — CREATININE, URINE, RANDOM: Creatinine, Urine: 134.11 mg/dL

## 2021-04-28 LAB — TSH: TSH: 0.787 u[IU]/mL (ref 0.350–4.500)

## 2021-04-28 LAB — HEMOGLOBIN A1C
Hgb A1c MFr Bld: 6.6 % — ABNORMAL HIGH (ref 4.8–5.6)
Mean Plasma Glucose: 142.72 mg/dL

## 2021-04-28 LAB — CK
Total CK: 26 U/L — ABNORMAL LOW (ref 49–397)
Total CK: 56 U/L (ref 49–397)

## 2021-04-28 LAB — RESP PANEL BY RT-PCR (FLU A&B, COVID) ARPGX2
Influenza A by PCR: NEGATIVE
Influenza B by PCR: NEGATIVE
SARS Coronavirus 2 by RT PCR: NEGATIVE

## 2021-04-28 LAB — HIV ANTIBODY (ROUTINE TESTING W REFLEX): HIV Screen 4th Generation wRfx: NONREACTIVE

## 2021-04-28 LAB — MAGNESIUM
Magnesium: 1.2 mg/dL — ABNORMAL LOW (ref 1.7–2.4)
Magnesium: 1.3 mg/dL — ABNORMAL LOW (ref 1.7–2.4)

## 2021-04-28 LAB — SODIUM, URINE, RANDOM: Sodium, Ur: <10 mmol/L

## 2021-04-28 LAB — PREALBUMIN: Prealbumin: <5 mg/dL — ABNORMAL LOW (ref 18–38)

## 2021-04-28 LAB — OSMOLALITY: Osmolality: 311 mOsm/kg — ABNORMAL HIGH (ref 275–295)

## 2021-04-28 LAB — OSMOLALITY, URINE: Osmolality, Ur: 379 mOsm/kg (ref 300–900)

## 2021-04-28 MED ORDER — SODIUM CHLORIDE 0.9% FLUSH
3.0000 mL | Freq: Two times a day (BID) | INTRAVENOUS | Status: DC
  Administered 2021-04-29 – 2021-05-06 (×8): 3 mL via INTRAVENOUS

## 2021-04-28 MED ORDER — LEVOTHYROXINE SODIUM 50 MCG PO TABS: 50.0000 ug | ORAL_TABLET | Freq: Every day | ORAL | Status: DC

## 2021-04-28 MED ORDER — SODIUM CHLORIDE 0.9 % IV SOLN
2.0000 g | Freq: Three times a day (TID) | INTRAVENOUS | Status: DC
  Administered 2021-04-28 – 2021-05-04 (×19): 2 g via INTRAVENOUS
  Filled 2021-04-28 (×19): qty 2

## 2021-04-28 MED ORDER — VANCOMYCIN HCL 1500 MG/300ML IV SOLN
1500.0000 mg | Freq: Two times a day (BID) | INTRAVENOUS | Status: DC
  Administered 2021-04-28 – 2021-04-30 (×5): 1500 mg via INTRAVENOUS
  Filled 2021-04-28 (×5): qty 300

## 2021-04-28 MED ORDER — SODIUM CHLORIDE 0.9 % IV SOLN: 250.0000 mL | INTRAVENOUS | Status: DC | PRN

## 2021-04-28 MED ORDER — TRAZODONE HCL 50 MG PO TABS: 50.0000 mg | ORAL_TABLET | Freq: Every evening | ORAL | Status: DC | PRN

## 2021-04-28 MED ORDER — MAGNESIUM SULFATE 4 GM/100ML IV SOLN
4.0000 g | Freq: Once | INTRAVENOUS | Status: AC
  Administered 2021-04-28: 4 g via INTRAVENOUS
  Filled 2021-04-28: qty 100

## 2021-04-28 MED ORDER — GABAPENTIN 100 MG PO CAPS
100.0000 mg | ORAL_CAPSULE | Freq: Three times a day (TID) | ORAL | Status: DC
  Administered 2021-04-28 – 2021-05-06 (×26): 100 mg via ORAL
  Filled 2021-04-28 (×26): qty 1

## 2021-04-28 MED ORDER — HYDROXYZINE HCL 25 MG PO TABS: 25.0000 mg | ORAL_TABLET | Freq: Four times a day (QID) | ORAL | Status: DC | PRN

## 2021-04-28 MED ORDER — LACTATED RINGERS IV SOLN: INTRAVENOUS | Status: DC

## 2021-04-28 MED ORDER — LORAZEPAM 2 MG/ML IJ SOLN
1.0000 mg | INTRAMUSCULAR | Status: DC | PRN
Start: 2021-04-28 — End: 2021-04-29
  Administered 2021-04-28 – 2021-04-29 (×3): 1 mg via INTRAVENOUS
  Filled 2021-04-28 (×3): qty 1

## 2021-04-28 MED ORDER — THIAMINE HCL 100 MG PO TABS
100.0000 mg | ORAL_TABLET | Freq: Every day | ORAL | Status: DC
  Administered 2021-04-29 – 2021-05-06 (×8): 100 mg via ORAL
  Filled 2021-04-28 (×8): qty 1

## 2021-04-28 MED ORDER — LEVOTHYROXINE SODIUM 50 MCG PO TABS
50.0000 ug | ORAL_TABLET | Freq: Every day | ORAL | Status: DC
  Administered 2021-04-28 – 2021-05-06 (×9): 50 ug via ORAL
  Filled 2021-04-28 (×9): qty 1

## 2021-04-28 MED ORDER — LABETALOL HCL 5 MG/ML IV SOLN: 10.0000 mg | INTRAVENOUS | Status: DC | PRN

## 2021-04-28 MED ORDER — ACETAMINOPHEN 325 MG PO TABS
650.0000 mg | ORAL_TABLET | Freq: Four times a day (QID) | ORAL | Status: DC | PRN
  Administered 2021-04-29 – 2021-04-30 (×2): 650 mg via ORAL
  Filled 2021-04-28 (×3): qty 2

## 2021-04-28 MED ORDER — ADULT MULTIVITAMIN W/MINERALS CH
1.0000 | ORAL_TABLET | Freq: Every day | ORAL | Status: DC
  Administered 2021-04-28 – 2021-05-06 (×9): 1 via ORAL
  Filled 2021-04-28 (×9): qty 1

## 2021-04-28 MED ORDER — LORAZEPAM 1 MG PO TABS
1.0000 mg | ORAL_TABLET | ORAL | Status: DC | PRN
  Administered 2021-04-28 (×2): 1 mg via ORAL
  Filled 2021-04-28 (×2): qty 1

## 2021-04-28 MED ORDER — SODIUM CHLORIDE 0.9% FLUSH: 3.0000 mL | INTRAVENOUS | Status: DC | PRN

## 2021-04-28 MED ORDER — HYDRALAZINE HCL 25 MG PO TABS: 25.0000 mg | ORAL_TABLET | ORAL | Status: DC | PRN

## 2021-04-28 MED ORDER — ACETAMINOPHEN 650 MG RE SUPP: 650.0000 mg | Freq: Four times a day (QID) | RECTAL | Status: DC | PRN

## 2021-04-28 MED ORDER — THIAMINE HCL 100 MG/ML IJ SOLN
100.0000 mg | Freq: Every day | INTRAMUSCULAR | Status: DC
  Administered 2021-04-28: 100 mg via INTRAVENOUS
  Filled 2021-04-28: qty 2

## 2021-04-28 MED ORDER — HYDROCODONE-ACETAMINOPHEN 5-325 MG PO TABS
1.0000 | ORAL_TABLET | ORAL | Status: DC | PRN
  Administered 2021-04-29: 1 via ORAL
  Filled 2021-04-28: qty 1

## 2021-04-28 MED ORDER — POTASSIUM CHLORIDE 10 MEQ/100ML IV SOLN
10.0000 meq | INTRAVENOUS | Status: AC
  Administered 2021-04-28 (×4): 10 meq via INTRAVENOUS
  Filled 2021-04-28 (×4): qty 100

## 2021-04-28 MED ORDER — FOLIC ACID 1 MG PO TABS
1.0000 mg | ORAL_TABLET | Freq: Every day | ORAL | Status: DC
  Administered 2021-04-28 – 2021-05-06 (×9): 1 mg via ORAL
  Filled 2021-04-28 (×9): qty 1

## 2021-04-28 NOTE — Progress Notes (Signed)
Left lower extremity venous duplex completed. Refer to "CV Proc" under chart review to view preliminary results.  04/28/2021 2:03 PM Eula Fried., MHA, RVT, RDCS, RDMS

## 2021-04-28 NOTE — Progress Notes (Signed)
Arrived via [facility] from [place].  Alert/Oriented.  CHG, CCMD notified.  Vital signs per protocol.

## 2021-04-28 NOTE — Progress Notes (Signed)
Progress Note    [name]   GYB:[id number]  DOB: 12/20/1960  DOA: 04/27/2021     1  PCP: [name] Sanes, MD  CC: Left leg wound/swelling  Hospital Course: [name] is a 60 yo male with PMH DM2, HTN, CHF, alcohol abuse, GERD, HLD, morbid obesity, OSA who presented with a worsening left foot infection.  He has had significant increase in the swelling in his right lower extremity. He has a history of left foot toe amputation a couple years ago he says due to bone infection at that time.  He understands possible bone infection in his heel at this time and is still hesitant towards amputation unless absolutely needed. He was started on vancomycin, cefepime, Flagyl on admission and orthopedic surgery was consulted. Left foot x-ray was obtained on admission which showed "considerable soft tissue swelling with plantar diabetic foot ulcer".   Interval History:  Mostly complaining of just dry mouth this morning.  States that his left leg has been this swollen in the past but this is the worst it has looked in quite a while he says.  ROS: Constitutional: negative for chills and fevers, Respiratory: negative for cough, Cardiovascular: negative for chest pain, and Gastrointestinal: negative for abdominal pain  Assessment & Plan: Severe sepsis Probable osteomyelitis -Leukocytosis, tachycardia, elevated lactic acid.  Source considered diabetic foot ulcer - Continue vancomycin, cefepime, Flagyl - Orthopedic surgery consulted.  Follow-up evaluation.  Patient understands possible need for amputation or some sort of surgical intervention  Alcohol abuse Alcoholic cirrhosis without ascites - Possible underreporting of consumption - Monitor on CIWA for signs of withdrawal  Chronic dCHF - last echo 04/2019 - EF 60-65%, normal diastology at that time - caution with IVF and monitor for volume overload  Hypertension -use labetalol or hydralazine PRN for now  Hypothyroidism -TSH normal -  Continue Synthroid  Morbid obesity Body mass index is 36.94 kg/m. -Contributing to multiple comorbidities  OSA -Nightly CPAP     Old records reviewed in assessment of this patient  Antimicrobials: Vanc 04/28/21 >> current Cefepime 04/28/21 >> current Flagyl 04/28/21 >> current  DVT prophylaxis: SCDs Start: 04/28/21 0452   Code Status:   Code Status: Full Code Family Communication:   Disposition Plan: Status is: Inpatient  Remains inpatient appropriate because:Ongoing diagnostic testing needed not appropriate for outpatient work up, IV treatments appropriate due to intensity of illness or inability to take PO, and Inpatient level of care appropriate due to severity of illness  Dispo: The patient is from: Home              Anticipated d/c is to: Home              Patient currently is not medically stable to d/c.   Difficult to place patient No      Risk of unplanned readmission score: Unplanned Admission- Pilot do not use: 23.12   Objective: Blood pressure (!) 159/73, pulse (!) 119, temperature (!) 97.5 F (36.4 C), temperature source Oral, resp. rate 18, height 6\' 1"  (1.854 m), weight 127 kg, SpO2 90 %.  Examination: General appearance: alert, cooperative, fatigued, and no distress Head: Normocephalic, without obvious abnormality, atraumatic Eyes:  EOMI Lungs: clear to auscultation bilaterally Heart: regular rate and rhythm and S1, S2 normal Abdomen:  obese, soft, NT, ND Extremities:  see media pics; very enlarged and erythematous LLE with bulla formation from swelling; large open wound/ulcer on plantar surface dripping seropurulent fluid; calor present Skin:  edematous lower extremities Neurologic:  decreased sensation in LE's  Consultants:  Ortho  Procedures:    Data Reviewed: I have personally reviewed following labs and imaging studies Results for orders placed or performed during the hospital encounter of 04/27/21 (from the past 24 hour(s))  Basic  metabolic panel     Status: Abnormal   Collection Time: 04/27/21  9:30 PM  Result Value Ref Range   Sodium 132 (L) 135 - 145 mmol/L   Potassium 3.0 (L) 3.5 - 5.1 mmol/L   Chloride 92 (L) 98 - 111 mmol/L   CO2 23 22 - 32 mmol/L   Glucose, Bld 156 (H) 70 - 99 mg/dL   BUN 10 6 - 20 mg/dL   Creatinine, Ser 1.610.78 0.61 - 1.24 mg/dL   Calcium 8.4 (L) 8.9 - 10.3 mg/dL   GFR, Estimated >09>60 >60>60 mL/min   Anion gap 17 (H) 5 - 15  CBC with Differential     Status: Abnormal   Collection Time: 04/27/21  9:30 PM  Result Value Ref Range   WBC 15.9 (H) 4.0 - 10.5 K/uL   RBC 3.57 (L) 4.22 - 5.81 MIL/uL   Hemoglobin 11.6 (L) 13.0 - 17.0 g/dL   HCT 45.434.0 (L) 09.839.0 - 11.952.0 %   MCV 95.2 80.0 - 100.0 fL   MCH 32.5 26.0 - 34.0 pg   MCHC 34.1 30.0 - 36.0 g/dL   RDW 14.715.6 (H) 82.911.5 - 56.215.5 %   Platelets 154 150 - 400 K/uL   nRBC 0.0 0.0 - 0.2 %   Neutrophils Relative % 77 %   Neutro Abs 12.2 (H) 1.7 - 7.7 K/uL   Lymphocytes Relative 8 %   Lymphs Abs 1.3 0.7 - 4.0 K/uL   Monocytes Relative 8 %   Monocytes Absolute 1.3 (H) 0.1 - 1.0 K/uL   Eosinophils Relative 0 %   Eosinophils Absolute 0.0 0.0 - 0.5 K/uL   Basophils Relative 0 %   Basophils Absolute 0.0 0.0 - 0.1 K/uL   WBC Morphology TOXIC GRANULATION    Immature Granulocytes 7 %   Abs Immature Granulocytes 1.14 (H) 0.00 - 0.07 K/uL  C-reactive protein     Status: Abnormal   Collection Time: 04/27/21  9:30 PM  Result Value Ref Range   CRP 33.5 (H) <1.0 mg/dL  Sedimentation rate     Status: Abnormal   Collection Time: 04/27/21  9:30 PM  Result Value Ref Range   Sed Rate 85 (H) 0 - 16 mm/hr  Lactic acid     Status: Abnormal   Collection Time: 04/27/21 10:53 PM  Result Value Ref Range   Lactic Acid, Venous 4.0 (HH) 0.5 - 1.9 mmol/L  Hemoglobin A1c     Status: Abnormal   Collection Time: 04/27/21 10:53 PM  Result Value Ref Range   Hgb A1c MFr Bld 6.6 (H) 4.8 - 5.6 %   Mean Plasma Glucose 142.72 mg/dL  Resp Panel by RT-PCR (Flu A&B, Covid)  Nasopharyngeal Swab     Status: None   Collection Time: 04/28/21 12:00 AM   Specimen: Nasopharyngeal Swab; Nasopharyngeal(NP) swabs in vial transport medium  Result Value Ref Range   SARS Coronavirus 2 by RT PCR NEGATIVE NEGATIVE   Influenza A by PCR NEGATIVE NEGATIVE   Influenza B by PCR NEGATIVE NEGATIVE  CBG monitoring, ED     Status: Abnormal   Collection Time: 04/28/21 12:06 AM  Result Value Ref Range   Glucose-Capillary 141 (H) 70 - 99 mg/dL  Urinalysis, Routine w reflex microscopic Urine, Clean  Catch     Status: Abnormal   Collection Time: 04/28/21  2:30 AM  Result Value Ref Range   Color, Urine AMBER (A) YELLOW   APPearance CLEAR CLEAR   Specific Gravity, Urine 1.012 1.005 - 1.030   pH 6.0 5.0 - 8.0   Glucose, UA NEGATIVE NEGATIVE mg/dL   Hgb urine dipstick SMALL (A) NEGATIVE   Bilirubin Urine NEGATIVE NEGATIVE   Ketones, ur NEGATIVE NEGATIVE mg/dL   Protein, ur NEGATIVE NEGATIVE mg/dL   Nitrite NEGATIVE NEGATIVE   Leukocytes,Ua NEGATIVE NEGATIVE   RBC / HPF 0-5 0 - 5 RBC/hpf   WBC, UA 0-5 0 - 5 WBC/hpf   Bacteria, UA NONE SEEN NONE SEEN   Squamous Epithelial / LPF 0-5 0 - 5   Mucus PRESENT    Hyaline Casts, UA PRESENT   Creatinine, urine, random     Status: None   Collection Time: 04/28/21  2:30 AM  Result Value Ref Range   Creatinine, Urine 134.11 mg/dL  Osmolality, urine     Status: None   Collection Time: 04/28/21  2:30 AM  Result Value Ref Range   Osmolality, Ur 379 300 - 900 mOsm/kg  Sodium, urine, random     Status: None   Collection Time: 04/28/21  2:30 AM  Result Value Ref Range   Sodium, Ur <10 mmol/L  Lactic acid, plasma     Status: Abnormal   Collection Time: 04/28/21  2:34 AM  Result Value Ref Range   Lactic Acid, Venous 3.6 (HH) 0.5 - 1.9 mmol/L  Comprehensive metabolic panel     Status: Abnormal   Collection Time: 04/28/21  2:39 AM  Result Value Ref Range   Sodium 132 (L) 135 - 145 mmol/L   Potassium 3.3 (L) 3.5 - 5.1 mmol/L   Chloride 93 (L)  98 - 111 mmol/L   CO2 24 22 - 32 mmol/L   Glucose, Bld 135 (H) 70 - 99 mg/dL   BUN 10 6 - 20 mg/dL   Creatinine, Ser 9.70 0.61 - 1.24 mg/dL   Calcium 8.2 (L) 8.9 - 10.3 mg/dL   Total Protein 7.7 6.5 - 8.1 g/dL   Albumin 2.4 (L) 3.5 - 5.0 g/dL   AST 39 15 - 41 U/L   ALT 23 0 - 44 U/L   Alkaline Phosphatase 147 (H) 38 - 126 U/L   Total Bilirubin 3.7 (H) 0.3 - 1.2 mg/dL   GFR, Estimated >26 >37 mL/min   Anion gap 15 5 - 15  CK     Status: Abnormal   Collection Time: 04/28/21  2:39 AM  Result Value Ref Range   Total CK 26 (L) 49 - 397 U/L  Magnesium     Status: Abnormal   Collection Time: 04/28/21  2:39 AM  Result Value Ref Range   Magnesium 1.2 (L) 1.7 - 2.4 mg/dL  Phosphorus     Status: None   Collection Time: 04/28/21  2:39 AM  Result Value Ref Range   Phosphorus 3.2 2.5 - 4.6 mg/dL  CBG monitoring, ED     Status: Abnormal   Collection Time: 04/28/21  3:56 AM  Result Value Ref Range   Glucose-Capillary 132 (H) 70 - 99 mg/dL  Lactic acid, plasma     Status: Abnormal   Collection Time: 04/28/21  5:06 AM  Result Value Ref Range   Lactic Acid, Venous 3.6 (HH) 0.5 - 1.9 mmol/L  Procalcitonin     Status: None   Collection Time: 04/28/21  5:06  AM  Result Value Ref Range   Procalcitonin 1.88 ng/mL  Prealbumin     Status: Abnormal   Collection Time: 04/28/21  5:06 AM  Result Value Ref Range   Prealbumin <5 (L) 18 - 38 mg/dL  Osmolality     Status: Abnormal   Collection Time: 04/28/21  5:06 AM  Result Value Ref Range   Osmolality 311 (H) 275 - 295 mOsm/kg  CK     Status: None   Collection Time: 04/28/21  5:06 AM  Result Value Ref Range   Total CK 56 49 - 397 U/L  Magnesium     Status: Abnormal   Collection Time: 04/28/21  5:06 AM  Result Value Ref Range   Magnesium 1.3 (L) 1.7 - 2.4 mg/dL  Phosphorus     Status: None   Collection Time: 04/28/21  5:06 AM  Result Value Ref Range   Phosphorus 3.1 2.5 - 4.6 mg/dL  Protime-INR     Status: Abnormal   Collection Time:  04/28/21  5:06 AM  Result Value Ref Range   Prothrombin Time 16.4 (H) 11.4 - 15.2 seconds   INR 1.3 (H) 0.8 - 1.2  CBC WITH DIFFERENTIAL     Status: Abnormal   Collection Time: 04/28/21  5:06 AM  Result Value Ref Range   WBC 12.1 (H) 4.0 - 10.5 K/uL   RBC 3.31 (L) 4.22 - 5.81 MIL/uL   Hemoglobin 10.6 (L) 13.0 - 17.0 g/dL   HCT 40.9 (L) 81.1 - 91.4 %   MCV 95.8 80.0 - 100.0 fL   MCH 32.0 26.0 - 34.0 pg   MCHC 33.4 30.0 - 36.0 g/dL   RDW 78.2 (H) 95.6 - 21.3 %   Platelets 147 (L) 150 - 400 K/uL   nRBC 0.0 0.0 - 0.2 %   Neutrophils Relative % 81 %   Neutro Abs 9.8 (H) 1.7 - 7.7 K/uL   Lymphocytes Relative 8 %   Lymphs Abs 0.9 0.7 - 4.0 K/uL   Monocytes Relative 7 %   Monocytes Absolute 0.9 0.1 - 1.0 K/uL   Eosinophils Relative 0 %   Eosinophils Absolute 0.0 0.0 - 0.5 K/uL   Basophils Relative 0 %   Basophils Absolute 0.0 0.0 - 0.1 K/uL   WBC Morphology TOXIC GRANULATION    Immature Granulocytes 4 %   Abs Immature Granulocytes 0.46 (H) 0.00 - 0.07 K/uL   Reactive, Benign Lymphocytes PRESENT   TSH     Status: None   Collection Time: 04/28/21  5:06 AM  Result Value Ref Range   TSH 0.787 0.350 - 4.500 uIU/mL  Comprehensive metabolic panel     Status: Abnormal   Collection Time: 04/28/21  5:06 AM  Result Value Ref Range   Sodium 132 (L) 135 - 145 mmol/L   Potassium 3.3 (L) 3.5 - 5.1 mmol/L   Chloride 95 (L) 98 - 111 mmol/L   CO2 23 22 - 32 mmol/L   Glucose, Bld 127 (H) 70 - 99 mg/dL   BUN 10 6 - 20 mg/dL   Creatinine, Ser 0.86 0.61 - 1.24 mg/dL   Calcium 7.8 (L) 8.9 - 10.3 mg/dL   Total Protein 6.5 6.5 - 8.1 g/dL   Albumin 2.3 (L) 3.5 - 5.0 g/dL   AST 35 15 - 41 U/L   ALT 22 0 - 44 U/L   Alkaline Phosphatase 150 (H) 38 - 126 U/L   Total Bilirubin 3.8 (H) 0.3 - 1.2 mg/dL   GFR, Estimated >57 >84  mL/min   Anion gap 14 5 - 15  CBG monitoring, ED     Status: Abnormal   Collection Time: 04/28/21  7:47 AM  Result Value Ref Range   Glucose-Capillary 133 (H) 70 - 99 mg/dL     Recent Results (from the past 240 hour(s))  Resp Panel by RT-PCR (Flu A&B, Covid) Nasopharyngeal Swab     Status: None   Collection Time: 04/28/21 12:00 AM   Specimen: Nasopharyngeal Swab; Nasopharyngeal(NP) swabs in vial transport medium  Result Value Ref Range Status   SARS Coronavirus 2 by RT PCR NEGATIVE NEGATIVE Final    Comment: (NOTE) SARS-CoV-2 target nucleic acids are NOT DETECTED.  The SARS-CoV-2 RNA is generally detectable in upper respiratory specimens during the acute phase of infection. The lowest concentration of SARS-CoV-2 viral copies this assay can detect is 138 copies/mL. A negative result does not preclude SARS-Cov-2 infection and should not be used as the sole basis for treatment or other patient management decisions. A negative result may occur with  improper specimen collection/handling, submission of specimen other than nasopharyngeal swab, presence of viral mutation(s) within the areas targeted by this assay, and inadequate number of viral copies(<138 copies/mL). A negative result must be combined with clinical observations, patient history, and epidemiological information. The expected result is Negative.  Fact Sheet for Patients:  BloggerCourse.com  Fact Sheet for Healthcare Providers:  SeriousBroker.it  This test is no t yet approved or cleared by the [place] FDA and  has been authorized for detection and/or diagnosis of SARS-CoV-2 by FDA under an Emergency Use Authorization (EUA). This EUA will remain  in effect (meaning this test can be used) for the duration of the COVID-19 declaration under Section 564(b)(1) of the Act, 21 U.S.C.section 360bbb-3(b)(1), unless the authorization is terminated  or revoked sooner.       Influenza A by PCR NEGATIVE NEGATIVE Final   Influenza B by PCR NEGATIVE NEGATIVE Final    Comment: (NOTE) The Xpert Xpress SARS-CoV-2/FLU/RSV plus assay is intended as an  aid in the diagnosis of influenza from Nasopharyngeal swab specimens and should not be used as a sole basis for treatment. Nasal washings and aspirates are unacceptable for Xpert Xpress SARS-CoV-2/FLU/RSV testing.  Fact Sheet for Patients: BloggerCourse.com  Fact Sheet for Healthcare Providers: SeriousBroker.it  This test is not yet approved or cleared by the [place] FDA and has been authorized for detection and/or diagnosis of SARS-CoV-2 by FDA under an Emergency Use Authorization (EUA). This EUA will remain in effect (meaning this test can be used) for the duration of the COVID-19 declaration under Section 564(b)(1) of the Act, 21 U.S.C. section 360bbb-3(b)(1), unless the authorization is terminated or revoked.  Performed at [facility], [address], [place], [place] [address]      Radiology Studies: DG CHEST PORT 1 VIEW  Result Date: 04/28/2021 CLINICAL DATA:  Diabetic foot infection, history of CHF, diabetes, hypertension, former smoker EXAM: PORTABLE CHEST 1 VIEW COMPARISON:  Radiograph 11/24/2020 FINDINGS: No consolidation, features of edema, pneumothorax, or effusion. Pulmonary vascularity is normally distributed. The cardiomediastinal contours are unremarkable for the portable technique. No acute osseous or soft tissue abnormality. IMPRESSION: No acute cardiopulmonary abnormality. Electronically Signed   By: Kreg Shropshire M.D.   On: 04/28/2021 00:24   DG Foot Complete Left  Result Date: 04/27/2021 CLINICAL DATA:  Diabetic foot ulcers with soft tissue swelling erythema, initial encounter EXAM: LEFT FOOT - COMPLETE 3+ VIEW COMPARISON:  10/15/2019 FINDINGS: Generalized soft tissue  swelling is noted about the ankle and foot. Changes of prior amputation in the midportion of the third metatarsal are seen. Large soft tissue ulcer is noted along the plantar aspect of the foot near the base of the metatarsals.  Mottled density is noted within the proximal aspect of the fifth metatarsal suspicious for osteomyelitis. Degenerative changes of the first MTP joint are seen. No acute fracture is noted. IMPRESSION: Considerable soft tissue swelling with plantar diabetic foot ulcer. Some mottled appearance to the base of the fifth metatarsal is seen which may represent some underlying osteomyelitis. Nonemergent MRI may be helpful for further evaluation. Electronically Signed   By: Alcide Clever M.D.   On: 04/27/2021 21:22   DG CHEST PORT 1 VIEW  Final Result    DG Foot Complete Left  Final Result    VAS [place] ABI WITH/WO TBI    (Results Pending)  VAS [place] LOWER EXTREMITY VENOUS (DVT)    (Results Pending)    Scheduled Meds:  folic acid  1 mg Oral Daily   gabapentin  100 mg Oral TID   insulin aspart  0-9 Units Subcutaneous Q4H   levothyroxine  50 mcg Oral Q0600   multivitamin with minerals  1 tablet Oral Daily   sodium chloride flush  3 mL Intravenous Q12H   thiamine  100 mg Oral Daily   Or   thiamine  100 mg Intravenous Daily   PRN Meds: sodium chloride, acetaminophen **OR** acetaminophen, HYDROcodone-acetaminophen, hydrOXYzine, LORazepam **OR** LORazepam, oxyCODONE-acetaminophen, sodium chloride flush, traZODone Continuous Infusions:  sodium chloride     ceFEPime (MAXIPIME) IV Stopped (04/28/21 1610)   lactated ringers 125 mL/hr at 04/28/21 0407   metronidazole 500 mg (04/28/21 1048)   potassium chloride 10 mEq (04/28/21 1047)   vancomycin       LOS: 1 day  Time spent: Greater than 50% of the 35 minute visit was spent in counseling/coordination of care for the patient as laid out in the A&P.   Lewie Chamber, MD [facility] 04/28/2021, 11:38 AM

## 2021-04-28 NOTE — Progress Notes (Signed)
Attempted ABI, however patient is being transferred to [facility]. We will attempt again at [facility] once pt is transferred, as schedule permits.  04/28/2021 3:20 PM Eula Fried., MHA, RVT, RDCS, RDMS

## 2021-04-28 NOTE — Consult Note (Signed)
Dr. Charlann Boxer received consult for evaluation of worsening left foot infection. Patient is well known by Dr. Victorino Dike, and has had prior surgery on this foot with him. Dr. Charlann Boxer discussed the case with Dr. Victorino Dike, who requests transfer to [facility] for evaluation and treatment.  Dr. Victorino Dike will see the patient in the AM to discuss treatment plan. Full consult note to follow tomorrow.   For now, continue IV abx. Continue pain management. NPO after midnight in case of surgical intervention tomorrow.  [name] Bible, PA-C Orthopedic Surgery [facility] [phone]

## 2021-04-28 NOTE — Plan of Care (Signed)
  Problem: Education: Goal: Knowledge of General Education information will improve Description: Including pain rating scale, medication(s)/side effects and non-pharmacologic comfort measures Outcome: Progressing   Problem: Health Behavior/Discharge Planning: Goal: Ability to manage health-related needs will improve Outcome: Progressing   Problem: Clinical Measurements: Goal: Ability to maintain clinical measurements within normal limits will improve Outcome: Progressing Goal: Will remain free from infection Outcome: Progressing Goal: Diagnostic test results will improve Outcome: Progressing Goal: Respiratory complications will improve Outcome: Progressing Goal: Cardiovascular complication will be avoided Outcome: Progressing   Problem: Pain Managment: Goal: General experience of comfort will improve Outcome: Progressing   Problem: Safety: Goal: Ability to remain free from injury will improve Outcome: Progressing   Problem: Metabolic: Goal: Ability to maintain appropriate glucose levels will improve Outcome: Progressing   Problem: Skin Integrity: Goal: Risk for impaired skin integrity will decrease Outcome: Progressing   Problem: Tissue Perfusion: Goal: Adequacy of tissue perfusion will improve Outcome: Progressing

## 2021-04-28 NOTE — Progress Notes (Signed)
OT Cancellation Note  Patient Details Name: [name] MRN: [id number] DOB: [birth year]   Cancelled Treatment:    Reason Eval/Treat Not Completed: Other (comment) Patient awaiting ortho consult this AM for foot ulcer, with possible transfer to [facility]. Will check back as schedule permits.  Marlyce Huge OT OT pager: [phone]   Carmelia Roller 04/28/2021, 7:16 AM

## 2021-04-28 NOTE — Progress Notes (Signed)
  PT Cancellation Note  Patient Details Name: Ranger Petrich MRN: [id number] DOB: 03-30-1961   Cancelled Treatment:    Reason Eval/Treat Not Completed: Patient not medically ready, orthopedics to assess for any surgical intervention.Will follow for PT readiness.   Rada Hay 04/28/2021, 7:17 AM   Blanchard Kelch PT Acute Rehabilitation Services Pager [phone] Office [phone]'

## 2021-04-28 NOTE — ED Notes (Signed)
[facility] called  

## 2021-04-29 ENCOUNTER — Inpatient Hospital Stay (HOSPITAL_COMMUNITY): Payer: Medicare Other

## 2021-04-29 DIAGNOSIS — I70229 Atherosclerosis of native arteries of extremities with rest pain, unspecified extremity: Secondary | ICD-10-CM

## 2021-04-29 DIAGNOSIS — F1010 Alcohol abuse, uncomplicated: Secondary | ICD-10-CM | POA: Diagnosis not present

## 2021-04-29 DIAGNOSIS — G621 Alcoholic polyneuropathy: Secondary | ICD-10-CM | POA: Diagnosis not present

## 2021-04-29 DIAGNOSIS — A419 Sepsis, unspecified organism: Secondary | ICD-10-CM | POA: Diagnosis not present

## 2021-04-29 DIAGNOSIS — D649 Anemia, unspecified: Secondary | ICD-10-CM

## 2021-04-29 DIAGNOSIS — L0390 Cellulitis, unspecified: Secondary | ICD-10-CM

## 2021-04-29 DIAGNOSIS — E11621 Type 2 diabetes mellitus with foot ulcer: Secondary | ICD-10-CM | POA: Diagnosis not present

## 2021-04-29 DIAGNOSIS — I10 Essential (primary) hypertension: Secondary | ICD-10-CM

## 2021-04-29 LAB — CBC WITH DIFFERENTIAL/PLATELET
Abs Immature Granulocytes: 0.06 10*3/uL (ref 0.00–0.07)
Basophils Absolute: 0 10*3/uL (ref 0.0–0.1)
Basophils Relative: 0 %
Eosinophils Absolute: 0 10*3/uL (ref 0.0–0.5)
Eosinophils Relative: 0 %
HCT: 28.8 % — ABNORMAL LOW (ref 39.0–52.0)
Hemoglobin: 9.9 g/dL — ABNORMAL LOW (ref 13.0–17.0)
Immature Granulocytes: 1 %
Lymphocytes Relative: 12 %
Lymphs Abs: 0.8 10*3/uL (ref 0.7–4.0)
MCH: 32.5 pg (ref 26.0–34.0)
MCHC: 34.4 g/dL (ref 30.0–36.0)
MCV: 94.4 fL (ref 80.0–100.0)
Monocytes Absolute: 0.6 10*3/uL (ref 0.1–1.0)
Monocytes Relative: 9 %
Neutro Abs: 5.2 10*3/uL (ref 1.7–7.7)
Neutrophils Relative %: 78 %
Platelets: 142 10*3/uL — ABNORMAL LOW (ref 150–400)
RBC: 3.05 MIL/uL — ABNORMAL LOW (ref 4.22–5.81)
RDW: 15.5 % (ref 11.5–15.5)
WBC: 6.6 10*3/uL (ref 4.0–10.5)
nRBC: 0 % (ref 0.0–0.2)

## 2021-04-29 LAB — GLUCOSE, CAPILLARY
Glucose-Capillary: 101 mg/dL — ABNORMAL HIGH (ref 70–99)
Glucose-Capillary: 102 mg/dL — ABNORMAL HIGH (ref 70–99)
Glucose-Capillary: 118 mg/dL — ABNORMAL HIGH (ref 70–99)
Glucose-Capillary: 133 mg/dL — ABNORMAL HIGH (ref 70–99)
Glucose-Capillary: 95 mg/dL (ref 70–99)

## 2021-04-29 LAB — BASIC METABOLIC PANEL
Anion gap: 11 (ref 5–15)
BUN: 9 mg/dL (ref 6–20)
CO2: 21 mmol/L — ABNORMAL LOW (ref 22–32)
Calcium: 7.7 mg/dL — ABNORMAL LOW (ref 8.9–10.3)
Chloride: 99 mmol/L (ref 98–111)
Creatinine, Ser: 0.75 mg/dL (ref 0.61–1.24)
GFR, Estimated: >60 mL/min (ref >60)
Glucose, Bld: 96 mg/dL (ref 70–99)
Potassium: 3.7 mmol/L (ref 3.5–5.1)
Sodium: 131 mmol/L — ABNORMAL LOW (ref 135–145)

## 2021-04-29 LAB — MAGNESIUM: Magnesium: 1.9 mg/dL (ref 1.7–2.4)

## 2021-04-29 LAB — LACTIC ACID, PLASMA: Lactic Acid, Venous: 1.2 mmol/L (ref 0.5–1.9)

## 2021-04-29 MED ORDER — HYDROXYZINE HCL 25 MG PO TABS: 25.0000 mg | ORAL_TABLET | Freq: Four times a day (QID) | ORAL | Status: DC | PRN

## 2021-04-29 MED ORDER — GLUCERNA SHAKE PO LIQD
237.0000 mL | Freq: Three times a day (TID) | ORAL | Status: DC
  Administered 2021-04-29 – 2021-05-06 (×10): 237 mL via ORAL

## 2021-04-29 MED ORDER — GADOBUTROL 1 MMOL/ML IV SOLN
10.0000 mL | Freq: Once | INTRAVENOUS | Status: AC | PRN
  Administered 2021-04-29: 10 mL via INTRAVENOUS

## 2021-04-29 MED ORDER — HYDROCODONE-ACETAMINOPHEN 5-325 MG PO TABS
1.0000 | ORAL_TABLET | ORAL | Status: DC | PRN
  Administered 2021-04-29 – 2021-05-06 (×19): 1 via ORAL
  Filled 2021-04-29 (×19): qty 1

## 2021-04-29 MED ORDER — DULOXETINE HCL 20 MG PO CPEP
20.0000 mg | ORAL_CAPSULE | Freq: Every day | ORAL | Status: DC
  Administered 2021-04-29 – 2021-05-06 (×8): 20 mg via ORAL
  Filled 2021-04-29 (×8): qty 1

## 2021-04-29 MED ORDER — LORAZEPAM 1 MG PO TABS
1.0000 mg | ORAL_TABLET | Freq: Four times a day (QID) | ORAL | Status: DC | PRN
  Administered 2021-04-29 – 2021-04-30 (×3): 1 mg via ORAL
  Filled 2021-04-29 (×3): qty 1

## 2021-04-29 NOTE — Progress Notes (Signed)
ABI has been completed.   Preliminary results in CV Proc.   Blanch Media 04/29/2021 3:04 PM

## 2021-04-29 NOTE — Progress Notes (Addendum)
PROGRESS NOTE    [name]  NWG:[id number] DOB: 18-Aug-1961 DOA: 04/27/2021 PCP: [name], MD    Brief Narrative:  Mr. Strout was admitted to the hospital with the working diagnosis of severe sepsis due to left diabetic foot infection, possible osteomyelitis.   60 year old male past medical history for type 2 diabetes mellitus, hypertension, heart failure, alcohol abuse, obesity and obstructive sleep apnea who presented with left foot infection.  Reported chronic left foot that lately developed worsening edema and expanding erythema.  On the day of hospitalization he was seen at the outpatient clinic and was referred to the hospital for IV antibiotics.  On his initial physical examination his blood pressure was 147/112, heart rate 128, respiratory rate 12, temperature 98.8, oxygen saturation 93%, his lungs were clear to auscultation bilaterally, heart S1 -S2, present, tachycardic, his abdomen was soft nontender, his left leg was edematous, positive distal lymphedema extending up to the mid leg, midfoot plantar deep ulcerated wound.  Sodium 132, potassium 3.0, chloride 92, bicarb 23, glucose 156, BUN 10, creatinine 0.78, lactic acid 4.0, white count 15.9, hemoglobin 11.6, hematocrit 34.0, platelets 154 SARS COVID-19 negative.  Urinalysis specific gravity 1.012, urine sodium less than 10, urine osmolality 379.  Left foot radiograph with considerable soft tissue swelling and plantar diabetic foot ulcer.  Mottled appearance of the base of the fifth metatarsal.  EKG 115 bpm, normal axis, normal intervals, sinus rhythm, no significant ST segment or T wave changes.  Assessment & Plan:   Principal Problem:   Severe sepsis (HCC) Active Problems:   Hypertension   Morbid obesity (HCC)   OSA (obstructive sleep apnea)   Hyperlipidemia   Prediabetes   Ulcer of left heel (HCC)   Anemia   Hypothyroidism   Alcoholic cirrhosis of liver without ascites (HCC)   Alcoholic peripheral neuropathy  (HCC)   Osteomyelitis (HCC)   Alcohol abuse   Diabetic foot ulcer (HCC)   Left foot diabetic foot infection/ cellulitis/ possible osteomyelitis, complicated with severe sepsis (end-organ damage elevated lactic acid) present on admission.  Persistent edema and erythema, no significant pain due to chronic diabetic neuropathy.  Wbc is 6,6, cultures with no growth.  Pending left foot MRI to rule out osteomyelitis.   Continue antibiotic therapy with vancomycin and cefepime. Ok to discontinue metronidazole for now.  Discontinue IV fluids.   Follow up on cell count and final orthopedics recommendations, patient may need amputation.  2. Alcohol abuse/ chronic alcoholic cirrhosis. Patient's last drink was July 1st. Has been getting counseling as outpatient. Clinically no frank signs of withdrawal.  Plan to continue as needed lorazepam 1 mg qid prn, hold on CIWA protocol for now to prevent high dose of lorazepam.  Continue vitamins   3. Chronic diastolic congested heart failure/ HTN No clinical signs of acute decompensation Continue blood pressure control.   4, Hypothyroid Continue levothyroxine.   5. Obesity class 2. OSA calculated BMI is 36,9. Continue Cpap at night.  Consult PT and Ot   6. T2DM. Continue glucose cover and monitoring with insulin sliding scale. Patient is tolerating po well. Fasting glucose this am is 96 mg/dl.    Patient continue to be at high risk for worsening sepsis   Status is: Inpatient  Remains inpatient appropriate because:Inpatient level of care appropriate due to severity of illness  Dispo: The patient is from: Home              Anticipated d/c is to: Home  Patient currently is not medically stable to d/c.   Difficult to place patient No   DVT prophylaxis: Enoxaparin   Code Status:   full  Family Communication:  No family at the bedside      Consultants:  Orthopedics    Antimicrobials:  Cefepime and vancomycin      Subjective: Patient continue to have left leg edema and erythema, no frank pain, no nausea or vomiting. No chest pain or dyspnea, decreased mobility. Feels somnolent after lorazepam.   Objective: Vitals:   04/28/21 1812 04/28/21 2312 04/29/21 0400 04/29/21 0805  BP: 129/70 130/69 (!) 106/52 114/63  Pulse: 98   (!) 102  Resp:  18 20 20   Temp: 98.2 F (36.8 C) 98.2 F (36.8 C) 98.3 F (36.8 C) 98.5 F (36.9 C)  TempSrc: Oral Oral Oral Oral  SpO2: 95% 100% 92% 94%  Weight:      Height:        Intake/Output Summary (Last 24 hours) at 04/29/2021 1032 Last data filed at 04/29/2021 0600 Gross per 24 hour  Intake 3571.81 ml  Output 650 ml  Net 2921.81 ml   Filed Weights   04/27/21 1950  Weight: 127 kg    Examination:   General: Not in pain or dyspnea, deconditioned  Neurology: Awake and alert, non focal. No tremors or anxiety E ENT: mild pallor, no icterus, oral mucosa moist Cardiovascular: No JVD. S1-S2 present, rhythmic, no gallops, rubs, or murmurs. Positive left lower extremity edema. Pulmonary: positive breath sounds bilaterally, adequate air movement, no wheezing, rhonchi or rales. Gastrointestinal. Abdomen protuberant and non tender Skin. Left foot with edema with erythema, increased local temperature.  Musculoskeletal: no joint deformities     Data Reviewed: I have personally reviewed following labs and imaging studies  CBC: Recent Labs  Lab 04/27/21 2130 04/28/21 0506 04/29/21 0630  WBC 15.9* 12.1* 6.6  NEUTROABS 12.2* 9.8* 5.2  HGB 11.6* 10.6* 9.9*  HCT 34.0* 31.7* 28.8*  MCV 95.2 95.8 94.4  PLT 154 147* 142*   Basic Metabolic Panel: Recent Labs  Lab 04/27/21 2130 04/28/21 0239 04/28/21 0506 04/29/21 0149  NA 132* 132* 132* 131*  K 3.0* 3.3* 3.3* 3.7  CL 92* 93* 95* 99  CO2 23 24 23  21*  GLUCOSE 156* 135* 127* 96  BUN 10 10 10 9   CREATININE 0.78 0.73 0.71 0.75  CALCIUM 8.4* 8.2* 7.8* 7.7*  MG  --  1.2* 1.3* 1.9  PHOS  --  3.2 3.1  --     GFR: Estimated Creatinine Clearance: 138.8 mL/min (by C-G formula based on SCr of 0.75 mg/dL). Liver Function Tests: Recent Labs  Lab 04/28/21 0239 04/28/21 0506  AST 39 35  ALT 23 22  ALKPHOS 147* 150*  BILITOT 3.7* 3.8*  PROT 7.7 6.5  ALBUMIN 2.4* 2.3*   No results for input(s): LIPASE, AMYLASE in the last 168 hours. No results for input(s): AMMONIA in the last 168 hours. Coagulation Profile: Recent Labs  Lab 04/28/21 0506  INR 1.3*   Cardiac Enzymes: Recent Labs  Lab 04/28/21 0239 04/28/21 0506  CKTOTAL 26* 56   BNP (last 3 results) Recent Labs    11/24/20 1343  PROBNP 26.0   HbA1C: Recent Labs    04/27/21 2253  HGBA1C 6.6*   CBG: Recent Labs  Lab 04/28/21 0747 04/28/21 1222 04/28/21 1951 04/28/21 2318 04/29/21 0448  GLUCAP 133* 129* 123* 116* 95   Lipid Profile: No results for input(s): CHOL, HDL, LDLCALC, TRIG, CHOLHDL, LDLDIRECT in  the last 72 hours. Thyroid Function Tests: Recent Labs    04/28/21 0506  TSH 0.787   Anemia Panel: No results for input(s): VITAMINB12, FOLATE, FERRITIN, TIBC, IRON, RETICCTPCT in the last 72 hours.    Radiology Studies: I have reviewed all of the imaging during this hospital visit personally     Scheduled Meds:  folic acid  1 mg Oral Daily   gabapentin  100 mg Oral TID   insulin aspart  0-9 Units Subcutaneous Q4H   levothyroxine  50 mcg Oral Q0600   multivitamin with minerals  1 tablet Oral Daily   sodium chloride flush  3 mL Intravenous Q12H   thiamine  100 mg Oral Daily   Or   thiamine  100 mg Intravenous Daily   Continuous Infusions:  sodium chloride     ceFEPime (MAXIPIME) IV 2 g (04/29/21 0047)   lactated ringers 125 mL/hr at 04/29/21 0649   metronidazole 500 mg (04/29/21 0502)   vancomycin 1,500 mg (04/29/21 0157)     LOS: 2 days        Nicolis Boody Annett Gula, MD

## 2021-04-29 NOTE — Progress Notes (Signed)
Report from MRI:  Large plantar ft ulceration, level of the TMT joint, 4x3.5 cm, Sinus track with fluid Extending to metatarsal base of the 5th, suspicious for acute osteomyelitis.  Kalman Jewels, RN

## 2021-04-29 NOTE — Progress Notes (Addendum)
OT Cancellation Note  Patient Details Name: Tamon Parkerson MRN: [id number] DOB: 1961/03/03   Cancelled Treatment:    Reason Eval/Treat Not Completed: Other (comment) Initiated OT eval this AM. However, per RN, pt about to be transported to MRI. Will follow-up again as schedule permits to complete OT eval.   Addendum: Re-attempted OT eval at 12:40PM though pt had just received lunch. Per RN, pt also leaving unit soon for another test. Will follow-up tomorrow for OT eval.  Lorre Munroe 04/29/2021, 8:11 AM

## 2021-04-29 NOTE — Progress Notes (Signed)
Initial Nutrition Assessment  DOCUMENTATION CODES:   Not applicable  INTERVENTION:   Glucerna Shake po TID, each supplement provides 220 kcal and 10 grams of protein MVI with minerals daily  NUTRITION DIAGNOSIS:   Increased nutrient needs related to wound healing as evidenced by estimated needs.  GOAL:   Patient will meet greater than or equal to 90% of their needs  MONITOR:   PO intake, Supplement acceptance, Weight trends, Labs, I & O's  REASON FOR ASSESSMENT:   Consult Wound healing  ASSESSMENT:   Patient with PMH significant for DM, HTN, CHF, ETOH use, GERD, HLD, and ventral hernia repair x2. Presents this admission with L foot nonhealing diabetic foot ulcer and cellulitis.  Patient denies decreased intake PTA. Typically consumes two meals with snacks daily. Meals consist of B- cereal with milk or PB toast and D- steak, vegetable, grain. Has snack of cheese with crackers and drinks 1-2 boost daily. Patient eager to eat this admission as he missed his first meal for MRI. Discussed the importance of protein intake to promote would healing. Patient willing to try Glucerna.   Patient endorses a UBW of 280 lb and denies recent weigh loss. Weight this admission looks to be stated, will need to obtain actual weight to assess for weight loss.   UOP: 650 ml x 24 hrs   Medications: SS novolog, thiamine Labs: Na 131 (L) CBG 95-123  NUTRITION - FOCUSED PHYSICAL EXAM:  Flowsheet Row Most Recent Value  Orbital Region No depletion  Upper Arm Region No depletion  Thoracic and Lumbar Region No depletion  Buccal Region No depletion  Temple Region No depletion  Clavicle Bone Region No depletion  Clavicle and Acromion Bone Region No depletion  Scapular Bone Region No depletion  Dorsal Hand No depletion  Patellar Region No depletion  Anterior Thigh Region No depletion  Posterior Calf Region No depletion  Edema (RD Assessment) Moderate  [LLE]  Hair Reviewed  Eyes Reviewed   Mouth Reviewed  Skin Reviewed  Nails Reviewed      Diet Order:   Diet Order             Diet Carb Modified Fluid consistency: Thin; Room service appropriate? Yes  Diet effective now                   EDUCATION NEEDS:   Not appropriate for education at this time  Skin:  Skin Assessment: Skin Integrity Issues: Skin Integrity Issues:: Diabetic Ulcer Diabetic Ulcer: L heel  Last BM:  PTA  Height:   Ht Readings from Last 1 Encounters:  04/27/21 6\' 1"  (1.854 m)    Weight:   Wt Readings from Last 1 Encounters:  04/27/21 127 kg    BMI:  Body mass index is 36.94 kg/m.  Estimated Nutritional Needs:   Kcal:  2300-2500 kcal  Protein:  115-130 grams  Fluid:  >/= 2 L/day  06/28/21 MS, RD, LDN, CNSC Clinical Nutrition Pager listed in [facility]

## 2021-04-29 NOTE — Consult Note (Signed)
Reason for Consult:left foot diabetic ulcer Referring Physician: Dr. Rosine Door is an 60 y.o. male.  HPI: 60 y/o male with PMh of diabetes c/b nonhealing diabetic foot ulcer and obesity is admitted for sepsis.  I'm asked to consult for management of the ulcer.  He was last seen in the office a few weeks ago.  MRI at that time showed no osteo deep to the ulcer or abscess.  He was offered total contact casting but declined.  Instead he elected use of a cam boot and attempt at nwb as much as possible.  Last week he reports increased swelling of the left foot and leg with increasing redness.  He also began experiencing f/c/n but no vomiting.  He recently has seen a podiatrist or orthopaedic surgeon at [facility].  He reports that the plan at that time was IV abx.  He is on cefipime and flagyl currently.  He says the leg is much less swollen and red now.  Past Medical History:  Diagnosis Date   Alcohol dependence ([place])    Arthritis    hips, hands   Bilateral carpal tunnel syndrome    Bilateral leg edema    Chronic   CHF (congestive heart failure) (HCC)    Diabetes mellitus without complication (HCC)    type 2   Diverticulitis    portion of colon removed   DOE (dyspnea on exertion)    occ   Elevated liver enzymes    Fatty liver    GERD (gastroesophageal reflux disease)    occ   Hammer toe    Hip pain    History of ventral hernia repair 2016   x2   Hyperlipidemia    pt unsure   Hypertension    Marijuana abuse    Morbid obesity ([place])    Neuromuscular disorder ([place])    peripheral neuropathy feet and few fingers   OSA (obstructive sleep apnea)    has OSA-not used CPAP 2-3 yrs could not tolerate cpap   PONV (postoperative nausea and vomiting)    Toe ulcer ([place])    left healed    Past Surgical History:  Procedure Laterality Date   AMPUTATION TOE Left 05/25/2018   Procedure: AMPUTATION TOE left 3rd;  Surgeon: Wylene [place], MD;  Location: [facility];   Service: Orthopedics;  Laterality: Left;  27mn, to follow   AMPUTATION TOE Left 06/28/2018   Procedure: Left foot revision 3rd toe amputation including 3rd metatarsal;  Surgeon: HWylene [place]mer MD;  [facility]  Service: Orthopedics;  Laterality: Left;  646m   BICEPS TENDON REPAIR Left 2014   Partial   COLONOSCOPY     GRAFT APPLICATION Right 02/16/60/0960 Procedure: GRAFT APPLICATION;  Surgeon: StLandis MartinsDPM;  [facility] Service: Podiatry;  Laterality: Right;   HERNIA REPAIR  2016   ventral   HIP CLOSED REDUCTION Right 09/01/2018   Procedure: CLOSED REDUCTION HIP;  Surgeon: RoNicholes StairsMD;  [facility];  Service: Orthopedics;  Laterality: Right;   INCISION AND DRAINAGE OF WOUND Right 03/03/2020   Procedure: IRRIGATION AND DEBRIDEMENT WOUND;  Surgeon: StLandis MartinsDPM;  [facility] Service: Podiatry;  Laterality: Right;   JOINT REPLACEMENT     b/l knees    METATARSAL HEAD EXCISION Right 03/03/2020   Procedure: IRRIGATION OF TOE AND CAUTERIZATION OF BLEEDING TOE;  Surgeon: StLandis MartinsDPM;  [facility] Service: Podiatry;  Laterality: Right;   METATARSAL HEAD EXCISION Right 03/03/2020   Procedure: METATARSAL HEAD EXCISION SECOND TOE RIGHT;  Surgeon: Landis Martins, DPM;  Location: [facility];  Service: Podiatry;  Laterality: Right;  MAC WITH LOCAL   TOE AMPUTATION Right 09/2013   TOTAL HIP ARTHROPLASTY Right 08/16/2018   Procedure: TOTAL HIP ARTHROPLASTY ANTERIOR APPROACH;  Surgeon: Rod Can, MD;  [facility];  Service: Orthopedics;  Laterality: Right;   TOTAL HIP ARTHROPLASTY Left 11/02/2018   Procedure: TOTAL HIP ARTHROPLASTY ANTERIOR APPROACH;  Surgeon: Rod Can, MD;  [facility];  Service: Orthopedics;  Laterality: Left;   TOTAL KNEE ARTHROPLASTY     bilat    Family History  Adopted: Yes  Family history unknown: Yes    Social History:   reports that he has quit smoking. His smoking use included cigarettes. He has a 2.50 pack-year smoking history. He has never used smokeless tobacco. He reports previous alcohol use. He reports current drug use. Frequency: 1.00 time per week. Drug: Marijuana.  Allergies:  Allergies  Allergen Reactions   Claritin [Loratadine] Shortness Of Breath and Anxiety    Medications: I have reviewed the patient's current medications.  Results for orders placed or performed during the hospital encounter of 04/27/21 (from the past 48 hour(s))  Basic metabolic panel     Status: Abnormal   Collection Time: 04/27/21  9:30 PM  Result Value Ref Range   Sodium 132 (L) 135 - 145 mmol/L   Potassium 3.0 (L) 3.5 - 5.1 mmol/L   Chloride 92 (L) 98 - 111 mmol/L   CO2 23 22 - 32 mmol/L   Glucose, Bld 156 (H) 70 - 99 mg/dL    Comment: Glucose reference range applies only to samples taken after fasting for at least 8 hours.   BUN 10 6 - 20 mg/dL   Creatinine, Ser 0.78 0.61 - 1.24 mg/dL   Calcium 8.4 (L) 8.9 - 10.3 mg/dL   GFR, Estimated >60 >60 mL/min    Comment: (NOTE) Calculated using the CKD-EPI Creatinine Equation (2021)    Anion gap 17 (H) 5 - 15    Comment: Performed at [facility], [place] [address] [place], [place] [address]  CBC with Differential     Status: Abnormal   Collection Time: 04/27/21  9:30 PM  Result Value Ref Range   WBC 15.9 (H) 4.0 - 10.5 K/uL   RBC 3.57 (L) 4.22 - 5.81 MIL/uL   Hemoglobin 11.6 (L) 13.0 - 17.0 g/dL   HCT 34.0 (L) 39.0 - 52.0 %   MCV 95.2 80.0 - 100.0 fL   MCH 32.5 26.0 - 34.0 pg   MCHC 34.1 30.0 - 36.0 g/dL   RDW 15.6 (H) 11.5 - 15.5 %   Platelets 154 150 - 400 K/uL   nRBC 0.0 0.0 - 0.2 %   Neutrophils Relative % 77 %   Neutro Abs 12.2 (H) 1.7 - 7.7 K/uL   Lymphocytes Relative 8 %   Lymphs Abs 1.3 0.7 - 4.0 K/uL   Monocytes Relative 8 %   Monocytes Absolute 1.3 (H) 0.1 - 1.0 K/uL   Eosinophils Relative 0 %   Eosinophils Absolute 0.0 0.0 -  0.5 K/uL   Basophils Relative 0 %   Basophils Absolute 0.0 0.0 - 0.1 K/uL   WBC Morphology TOXIC GRANULATION    Immature Granulocytes 7 %   Abs Immature Granulocytes 1.14 (H) 0.00 - 0.07 K/uL    Comment: Performed at [facility], [place] [address], [place], [place] [address]  C-reactive protein     Status: Abnormal   Collection Time: 04/27/21  9:30 PM  Result Value Ref Range   CRP 33.5 (H) <1.0 mg/dL    Comment: Performed at [facility], [place] [address], [place], [place] [address]  Sedimentation rate     Status: Abnormal   Collection Time: 04/27/21  9:30 PM  Result Value Ref Range   Sed Rate 85 (H) 0 - 16 mm/hr    Comment: Performed at [facility], [place] [address], [place], [place] [address]  Lactic acid     Status: Abnormal   Collection Time: 04/27/21 10:53 PM  Result Value Ref Range   Lactic Acid, Venous 4.0 (HH) 0.5 - 1.9 mmol/L    Comment: CRITICAL RESULT CALLED TO, READ BACK BY AND VERIFIED WITH: HAILEY, RN @ 1165 ON 04/28/21 Sandy Salaam Performed at [facility], [place] [address] [place], [place] [address]   Hemoglobin A1c     Status: Abnormal   Collection Time: 04/27/21 10:53 PM  Result Value Ref Range   Hgb A1c MFr Bld 6.6 (H) 4.8 - 5.6 %    Comment: (NOTE) Pre diabetes:          5.7%-6.4%  Diabetes:              >6.4%  Glycemic control for   <7.0% adults with diabetes    Mean Plasma Glucose 142.72 mg/dL    Comment: Performed at [place] [address], [place], [place] [address]  Resp Panel by RT-PCR (Flu A&B, Covid) Nasopharyngeal Swab     Status: None   Collection Time: 04/28/21 12:00 AM   Specimen: Nasopharyngeal Swab; Nasopharyngeal(NP) swabs in vial transport medium  Result Value Ref Range   SARS Coronavirus 2 by RT PCR NEGATIVE NEGATIVE    Comment: (NOTE) SARS-CoV-2 target nucleic acids are NOT DETECTED.  The SARS-CoV-2 RNA is generally detectable in upper  respiratory specimens during the acute phase of infection. The lowest concentration of SARS-CoV-2 viral copies this assay can detect is 138 copies/mL. A negative result does not preclude SARS-Cov-2 infection and should not be used as the sole basis for treatment or other patient management decisions. A negative result may occur with  improper specimen collection/handling, submission of specimen other than nasopharyngeal swab, presence of viral mutation(s) within the areas targeted by this assay, and inadequate number of viral copies(<138 copies/mL). A negative result must be combined with clinical observations, patient history, and epidemiological information. The expected result is Negative.  Fact Sheet for Patients:  EntrepreneurPulse.com.au  Fact Sheet for Healthcare Providers:  IncredibleEmployment.be  This test is no t yet approved or cleared by the [place] FDA and  has been authorized for detection and/or diagnosis of SARS-CoV-2 by FDA under an Emergency Use Authorization (EUA). This EUA will remain  in effect (meaning this test can be used) for the duration of the COVID-19 declaration under Section 564(b)(1) of the Act, 21 U.S.C.section 360bbb-3(b)(1), unless the authorization is terminated  or revoked sooner.       Influenza A by PCR NEGATIVE NEGATIVE   Influenza B by PCR NEGATIVE NEGATIVE    Comment: (NOTE) The Xpert Xpress SARS-CoV-2/FLU/RSV plus assay is intended as an aid in the diagnosis of influenza from Nasopharyngeal swab specimens and should not be used as a sole basis for treatment. Nasal washings and aspirates are unacceptable for Xpert Xpress SARS-CoV-2/FLU/RSV testing.  Fact Sheet  for Patients: EntrepreneurPulse.com.au  Fact Sheet for Healthcare Providers: IncredibleEmployment.be  This test is not yet approved or cleared by the [place] FDA and has been authorized for  detection and/or diagnosis of SARS-CoV-2 by FDA under an Emergency Use Authorization (EUA). This EUA will remain in effect (meaning this test can be used) for the duration of the COVID-19 declaration under Section 564(b)(1) of the Act, 21 U.S.C. section 360bbb-3(b)(1), unless the authorization is terminated or revoked.  Performed at [facility], [place] [address], [place], [place] [address]   CBG monitoring, ED     Status: Abnormal   Collection Time: 04/28/21 12:06 AM  Result Value Ref Range   Glucose-Capillary 141 (H) 70 - 99 mg/dL    Comment: Glucose reference range applies only to samples taken after fasting for at least 8 hours.  Urinalysis, Routine w reflex microscopic Urine, Clean Catch     Status: Abnormal   Collection Time: 04/28/21  2:30 AM  Result Value Ref Range   Color, Urine AMBER (A) YELLOW    Comment: BIOCHEMICALS MAY BE AFFECTED BY COLOR   APPearance CLEAR CLEAR   Specific Gravity, Urine 1.012 1.005 - 1.030   pH 6.0 5.0 - 8.0   Glucose, UA NEGATIVE NEGATIVE mg/dL   Hgb urine dipstick SMALL (A) NEGATIVE   Bilirubin Urine NEGATIVE NEGATIVE   Ketones, ur NEGATIVE NEGATIVE mg/dL   Protein, ur NEGATIVE NEGATIVE mg/dL   Nitrite NEGATIVE NEGATIVE   Leukocytes,Ua NEGATIVE NEGATIVE   RBC / HPF 0-5 0 - 5 RBC/hpf   WBC, UA 0-5 0 - 5 WBC/hpf   Bacteria, UA NONE SEEN NONE SEEN   Squamous Epithelial / LPF 0-5 0 - 5   Mucus PRESENT    Hyaline Casts, UA PRESENT     Comment: Performed at [facility], [place] [address], [place], [place] [address]  Creatinine, urine, random     Status: None   Collection Time: 04/28/21  2:30 AM  Result Value Ref Range   Creatinine, Urine 134.11 mg/dL    Comment: Performed at [facility], [place] [address] [place], [place] [address]  Osmolality, urine     Status: None   Collection Time: 04/28/21  2:30 AM  Result Value Ref Range   Osmolality, Ur 379 300 - 900 mOsm/kg    Comment: PERFORMED AT  [facility] Performed at [facility], [place] [address], [place], [place] [address]   Sodium, urine, random     Status: None   Collection Time: 04/28/21  2:30 AM  Result Value Ref Range   Sodium, Ur <10 mmol/L    Comment: Performed at [facility], [place] [address] [place], [place] [address]  Lactic acid, plasma     Status: Abnormal   Collection Time: 04/28/21  2:34 AM  Result Value Ref Range   Lactic Acid, Venous 3.6 (HH) 0.5 - 1.9 mmol/L    Comment: CRITICAL VALUE NOTED.  VALUE IS CONSISTENT WITH PREVIOUSLY REPORTED AND CALLED VALUE. Performed at [facility], [place] [address] [place], [place] [address]   Comprehensive metabolic panel     Status: Abnormal   Collection Time: 04/28/21  2:39 AM  Result Value Ref Range   Sodium 132 (L) 135 - 145 mmol/L   Potassium 3.3 (L) 3.5 - 5.1 mmol/L   Chloride 93 (L) 98 - 111 mmol/L   CO2 24 22 - 32 mmol/L   Glucose, Bld 135 (H) 70 - 99  mg/dL    Comment: Glucose reference range applies only to samples taken after fasting for at least 8 hours.   BUN 10 6 - 20 mg/dL   Creatinine, Ser 0.73 0.61 - 1.24 mg/dL   Calcium 8.2 (L) 8.9 - 10.3 mg/dL   Total Protein 7.7 6.5 - 8.1 g/dL   Albumin 2.4 (L) 3.5 - 5.0 g/dL   AST 39 15 - 41 U/L   ALT 23 0 - 44 U/L   Alkaline Phosphatase 147 (H) 38 - 126 U/L   Total Bilirubin 3.7 (H) 0.3 - 1.2 mg/dL   GFR, Estimated >60 >60 mL/min    Comment: (NOTE) Calculated using the CKD-EPI Creatinine Equation (2021)    Anion gap 15 5 - 15    Comment: Performed at [facility], [place] [address] [place], [place] [address]  CK     Status: Abnormal   Collection Time: 04/28/21  2:39 AM  Result Value Ref Range   Total CK 26 (L) 49 - 397 U/L    Comment: Performed at [facility], [place] [address] [place], [place] [address]  Magnesium     Status: Abnormal   Collection Time: 04/28/21  2:39 AM  Result Value Ref Range   Magnesium 1.2 (L) 1.7  - 2.4 mg/dL    Comment: Performed at [facility], [place] [address] [place], [place] [address]  Phosphorus     Status: None   Collection Time: 04/28/21  2:39 AM  Result Value Ref Range   Phosphorus 3.2 2.5 - 4.6 mg/dL    Comment: Performed at [facility], [place] [address] [place], [place] [address]  CBG monitoring, ED     Status: Abnormal   Collection Time: 04/28/21  3:56 AM  Result Value Ref Range   Glucose-Capillary 132 (H) 70 - 99 mg/dL    Comment: Glucose reference range applies only to samples taken after fasting for at least 8 hours.  Lactic acid, plasma     Status: Abnormal   Collection Time: 04/28/21  5:06 AM  Result Value Ref Range   Lactic Acid, Venous 3.6 (HH) 0.5 - 1.9 mmol/L    Comment: CRITICAL VALUE NOTED.  VALUE IS CONSISTENT WITH PREVIOUSLY REPORTED AND CALLED VALUE. Performed at [facility], [place] [address], [place], [place] [address]   Procalcitonin     Status: None   Collection Time: 04/28/21  5:06 AM  Result Value Ref Range   Procalcitonin 1.88 ng/mL    Comment:        Interpretation: PCT > 0.5 ng/mL and <= 2 ng/mL: Systemic infection (sepsis) is possible, but other conditions are known to elevate PCT as well. (NOTE)       Sepsis PCT Algorithm           Lower Respiratory Tract                                      Infection PCT Algorithm    ----------------------------     ----------------------------         PCT < 0.25 ng/mL                PCT < 0.10 ng/mL          Strongly encourage             Strongly discourage   discontinuation of antibiotics  initiation of antibiotics    ----------------------------     -----------------------------       PCT 0.25 - 0.50 ng/mL            PCT 0.10 - 0.25 ng/mL               OR       >80% decrease in PCT            Discourage initiation of                                            antibiotics      Encourage discontinuation           of antibiotics     ----------------------------     -----------------------------         PCT >= 0.50 ng/mL              PCT 0.26 - 0.50 ng/mL                AND       <80% decrease in PCT             Encourage initiation of                                             antibiotics       Encourage continuation           of antibiotics    ----------------------------     -----------------------------        PCT >= 0.50 ng/mL                  PCT > 0.50 ng/mL               AND         increase in PCT                  Strongly encourage                                      initiation of antibiotics    Strongly encourage escalation           of antibiotics                                     -----------------------------                                           PCT <= 0.25 ng/mL                                                 OR                                        >  80% decrease in PCT                                      Discontinue / Do not initiate                                             antibiotics  Performed at [place] [address] [place], [place] [address]   Prealbumin     Status: Abnormal   Collection Time: 04/28/21  5:06 AM  Result Value Ref Range   Prealbumin <5 (L) 18 - 38 mg/dL    Comment: Performed at [facility], [place] [address], [place], [place] [address]  Osmolality     Status: Abnormal   Collection Time: 04/28/21  5:06 AM  Result Value Ref Range   Osmolality 311 (H) 275 - 295 mOsm/kg    Comment: Performed at [place] [address] [name], [place] [address]  CK     Status: None   Collection Time: 04/28/21  5:06 AM  Result Value Ref Range   Total CK 56 49 - 397 U/L    Comment: Performed at [facility], [place] [address], [place], [place] [address]  Magnesium     Status: Abnormal   Collection Time: 04/28/21  5:06 AM  Result Value Ref Range   Magnesium 1.3 (L) 1.7 - 2.4 mg/dL    Comment: Performed at  [facility], [place] [address] [place], [place] [address]  Phosphorus     Status: None   Collection Time: 04/28/21  5:06 AM  Result Value Ref Range   Phosphorus 3.1 2.5 - 4.6 mg/dL    Comment: Performed at [facility], [place] [address], [place], [place] [address]  Protime-INR     Status: Abnormal   Collection Time: 04/28/21  5:06 AM  Result Value Ref Range   Prothrombin Time 16.4 (H) 11.4 - 15.2 seconds   INR 1.3 (H) 0.8 - 1.2    Comment: (NOTE) INR goal varies based on device and disease states. Performed at [facility], [place] [address] [place], [place] [address]   CBC WITH DIFFERENTIAL     Status: Abnormal   Collection Time: 04/28/21  5:06 AM  Result Value Ref Range   WBC 12.1 (H) 4.0 - 10.5 K/uL   RBC 3.31 (L) 4.22 - 5.81 MIL/uL   Hemoglobin 10.6 (L) 13.0 - 17.0 g/dL   HCT 31.7 (L) 39.0 - 52.0 %   MCV 95.8 80.0 - 100.0 fL   MCH 32.0 26.0 - 34.0 pg   MCHC 33.4 30.0 - 36.0 g/dL   RDW 15.7 (H) 11.5 - 15.5 %   Platelets 147 (L) 150 - 400 K/uL   nRBC 0.0 0.0 - 0.2 %   Neutrophils Relative % 81 %   Neutro Abs 9.8 (H) 1.7 - 7.7 K/uL   Lymphocytes Relative 8 %   Lymphs Abs 0.9 0.7 - 4.0 K/uL   Monocytes Relative 7 %   Monocytes Absolute 0.9 0.1 - 1.0 K/uL   Eosinophils Relative 0 %   Eosinophils Absolute 0.0 0.0 - 0.5 K/uL   Basophils Relative 0 %   Basophils Absolute 0.0 0.0 - 0.1 K/uL   WBC Morphology TOXIC GRANULATION  Immature Granulocytes 4 %   Abs Immature Granulocytes 0.46 (H) 0.00 - 0.07 K/uL   Reactive, Benign Lymphocytes PRESENT     Comment: Performed at [facility], [place] [address], [place], [place] [address]  TSH     Status: None   Collection Time: 04/28/21  5:06 AM  Result Value Ref Range   TSH 0.787 0.350 - 4.500 uIU/mL    Comment: Performed by a 3rd Generation assay with a functional sensitivity of <=0.01 uIU/mL. Performed at [facility], [place] [address],  [place], [place] [address]   Comprehensive metabolic panel     Status: Abnormal   Collection Time: 04/28/21  5:06 AM  Result Value Ref Range   Sodium 132 (L) 135 - 145 mmol/L   Potassium 3.3 (L) 3.5 - 5.1 mmol/L   Chloride 95 (L) 98 - 111 mmol/L   CO2 23 22 - 32 mmol/L   Glucose, Bld 127 (H) 70 - 99 mg/dL    Comment: Glucose reference range applies only to samples taken after fasting for at least 8 hours.   BUN 10 6 - 20 mg/dL   Creatinine, Ser 0.71 0.61 - 1.24 mg/dL   Calcium 7.8 (L) 8.9 - 10.3 mg/dL   Total Protein 6.5 6.5 - 8.1 g/dL   Albumin 2.3 (L) 3.5 - 5.0 g/dL   AST 35 15 - 41 U/L   ALT 22 0 - 44 U/L   Alkaline Phosphatase 150 (H) 38 - 126 U/L   Total Bilirubin 3.8 (H) 0.3 - 1.2 mg/dL   GFR, Estimated >60 >60 mL/min    Comment: (NOTE) Calculated using the CKD-EPI Creatinine Equation (2021)    Anion gap 14 5 - 15    Comment: Performed at [facility], [place] [address], [place], [place] [address]  HIV Antibody (routine testing w rflx)     Status: None   Collection Time: 04/28/21  5:29 AM  Result Value Ref Range   HIV Screen 4th Generation wRfx Non Reactive Non Reactive    Comment: Performed at [facility], [place] [address] [place], [place] [address]  CBG monitoring, ED     Status: Abnormal   Collection Time: 04/28/21  7:47 AM  Result Value Ref Range   Glucose-Capillary 133 (H) 70 - 99 mg/dL    Comment: Glucose reference range applies only to samples taken after fasting for at least 8 hours.  CBG monitoring, ED     Status: Abnormal   Collection Time: 04/28/21 12:22 PM  Result Value Ref Range   Glucose-Capillary 129 (H) 70 - 99 mg/dL    Comment: Glucose reference range applies only to samples taken after fasting for at least 8 hours.  Lactic acid, plasma     Status: Abnormal   Collection Time: 04/28/21  6:09 PM  Result Value Ref Range   Lactic Acid, Venous 2.3 (HH) 0.5 - 1.9 mmol/L    Comment: CRITICAL RESULT CALLED TO, READ BACK BY AND VERIFIED  WITH:  Donetta Potts, RN, 1909, 04/28/21, ADEDOKUNE  Performed at [facility], [place] [address], [place], [place] [address]   Glucose, capillary     Status: Abnormal   Collection Time: 04/28/21  7:51 PM  Result Value Ref Range   Glucose-Capillary 123 (H) 70 - 99 mg/dL    Comment: Glucose reference range applies only to samples taken after fasting for at least 8 hours.  Glucose, capillary     Status: Abnormal   Collection  Time: 04/28/21 11:18 PM  Result Value Ref Range   Glucose-Capillary 116 (H) 70 - 99 mg/dL    Comment: Glucose reference range applies only to samples taken after fasting for at least 8 hours.  Basic metabolic panel     Status: Abnormal   Collection Time: 04/29/21  1:49 AM  Result Value Ref Range   Sodium 131 (L) 135 - 145 mmol/L   Potassium 3.7 3.5 - 5.1 mmol/L   Chloride 99 98 - 111 mmol/L   CO2 21 (L) 22 - 32 mmol/L   Glucose, Bld 96 70 - 99 mg/dL    Comment: Glucose reference range applies only to samples taken after fasting for at least 8 hours.   BUN 9 6 - 20 mg/dL   Creatinine, Ser 0.75 0.61 - 1.24 mg/dL   Calcium 7.7 (L) 8.9 - 10.3 mg/dL   GFR, Estimated >60 >60 mL/min    Comment: (NOTE) Calculated using the CKD-EPI Creatinine Equation (2021)    Anion gap 11 5 - 15    Comment: Performed at [place] [address], [place], [place] [address]  Magnesium     Status: None   Collection Time: 04/29/21  1:49 AM  Result Value Ref Range   Magnesium 1.9 1.7 - 2.4 mg/dL    Comment: Performed at [place] [address] [place], [place] [address]  Glucose, capillary     Status: None   Collection Time: 04/29/21  4:48 AM  Result Value Ref Range   Glucose-Capillary 95 70 - 99 mg/dL    Comment: Glucose reference range applies only to samples taken after fasting for at least 8 hours.  CBC with Differential/Platelet     Status: Abnormal   Collection Time: 04/29/21  6:30 AM  Result Value Ref Range   WBC 6.6 4.0 - 10.5 K/uL   RBC 3.05 (L) 4.22  - 5.81 MIL/uL   Hemoglobin 9.9 (L) 13.0 - 17.0 g/dL   HCT 28.8 (L) 39.0 - 52.0 %   MCV 94.4 80.0 - 100.0 fL   MCH 32.5 26.0 - 34.0 pg   MCHC 34.4 30.0 - 36.0 g/dL   RDW 15.5 11.5 - 15.5 %   Platelets 142 (L) 150 - 400 K/uL   nRBC 0.0 0.0 - 0.2 %   Neutrophils Relative % 78 %   Neutro Abs 5.2 1.7 - 7.7 K/uL   Lymphocytes Relative 12 %   Lymphs Abs 0.8 0.7 - 4.0 K/uL   Monocytes Relative 9 %   Monocytes Absolute 0.6 0.1 - 1.0 K/uL   Eosinophils Relative 0 %   Eosinophils Absolute 0.0 0.0 - 0.5 K/uL   Basophils Relative 0 %   Basophils Absolute 0.0 0.0 - 0.1 K/uL   Immature Granulocytes 1 %   Abs Immature Granulocytes 0.06 0.00 - 0.07 K/uL    Comment: Performed at [facility], [address] [place],  [address]    DG CHEST PORT 1 VIEW  Result Date: 04/28/2021 CLINICAL DATA:  Diabetic foot infection, history of CHF, diabetes, hypertension, former smoker EXAM: PORTABLE CHEST 1 VIEW COMPARISON:  Radiograph 11/24/2020 FINDINGS: No consolidation, features of edema, pneumothorax, or effusion. Pulmonary vascularity is normally distributed. The cardiomediastinal contours are unremarkable for the portable technique. No acute osseous or soft tissue abnormality. IMPRESSION: No acute cardiopulmonary abnormality. Electronically Signed   By: Lovena Le M.D.   On: 04/28/2021 00:24   DG Foot Complete Left  Result Date: 04/27/2021 CLINICAL DATA:  Diabetic foot ulcers with soft tissue swelling erythema, initial encounter EXAM: LEFT FOOT - COMPLETE 3+ VIEW COMPARISON:  10/15/2019 FINDINGS: Generalized soft tissue swelling is noted about the ankle and foot. Changes of prior amputation in the midportion of the third metatarsal are seen. Large soft tissue ulcer is noted along the plantar aspect of the foot near the base of the metatarsals. Mottled density is noted within the proximal aspect of the fifth metatarsal suspicious for osteomyelitis. Degenerative changes of the first MTP joint are  seen. No acute fracture is noted. IMPRESSION: Considerable soft tissue swelling with plantar diabetic foot ulcer. Some mottled appearance to the base of the fifth metatarsal is seen which may represent some underlying osteomyelitis. Nonemergent MRI may be helpful for further evaluation. Electronically Signed   By: Inez Catalina M.D.   On: 04/27/2021 21:22   VAS [place] LOWER EXTREMITY VENOUS (DVT)  Result Date: 04/28/2021  Lower Venous DVT Study Patient Name:  MARTINO TOMPSON  Date of Exam:   04/28/2021 Medical Rec #: [id number]     Accession #:    [id number] Date of Birth: 1961/09/05     Patient Gender: M Patient Age:   59Y Exam Location:  [facility] Procedure:      VAS [place] LOWER EXTREMITY VENOUS (DVT) Referring Phys: Devola --------------------------------------------------------------------------------  Indications: Edema, and Erythema.  Limitations: Poor ultrasound/tissue interface, body habitus and open wound. Comparison Study: 01/29/2019 negative lower extremity venous duplex Performing Technologist: Maudry Mayhew MHA, RDMS, RVT, RDCS  Examination Guidelines: A complete evaluation includes B-mode imaging, spectral Doppler, color Doppler, and power Doppler as needed of all accessible portions of each vessel. Bilateral testing is considered an integral part of a complete examination. Limited examinations for reoccurring indications may be performed as noted. The reflux portion of the exam is performed with the patient in reverse Trendelenburg.  +-----+---------------+---------+-----------+----------+--------------+ RIGHTCompressibilityPhasicitySpontaneityPropertiesThrombus Aging +-----+---------------+---------+-----------+----------+--------------+ CFV  Full           Yes      Yes                                 +-----+---------------+---------+-----------+----------+--------------+   +---------+---------------+---------+-----------+----------+--------------+ LEFT      CompressibilityPhasicitySpontaneityPropertiesThrombus Aging +---------+---------------+---------+-----------+----------+--------------+ CFV      Full           Yes      Yes                                 +---------+---------------+---------+-----------+----------+--------------+ SFJ      Full                                                        +---------+---------------+---------+-----------+----------+--------------+ FV Prox  Full                                                        +---------+---------------+---------+-----------+----------+--------------+ FV Mid   Full                                                        +---------+---------------+---------+-----------+----------+--------------+  FV DistalFull                                                        +---------+---------------+---------+-----------+----------+--------------+ PFV      Full                                                        +---------+---------------+---------+-----------+----------+--------------+ POP      Full           Yes      Yes                                 +---------+---------------+---------+-----------+----------+--------------+ PTV      Full                                                        +---------+---------------+---------+-----------+----------+--------------+ PERO     Full                                                        +---------+---------------+---------+-----------+----------+--------------+     Summary: RIGHT: - No evidence of common femoral vein obstruction.  LEFT: - There is no evidence of deep vein thrombosis in the lower extremity.  - No cystic structure found in the popliteal fossa.  *See table(s) above for measurements and observations. Electronically signed by Ruta Hinds MD on 04/28/2021 at 5:10:10 PM.    Final     ROS:  + f/n/v.  + swelling and erythema at the left leg.  10 system review o/w neg. PE:  Blood  pressure (!) 106/52, pulse 98, temperature 98.3 F (36.8 C), temperature source Oral, resp. rate 20, height _0  (1.854 m), weight 127 kg, SpO2 92 %. Obese male in nad.  A and O.  EOMI.  Resp unlabored.  L LE with venous stasis changes at the leg.  Left leg and foot are swollen.  Plantar ulcer is approx 7 cm across and about 3 cm deep.  No exposed bone.  Dminished sens to LT at the dorsal and plantar foot.  No lympangiitis.  5/5 strength in pf and df of the ankle and toes.    Assessment/Plan: L foot nonhealing diabetic ulcer and cellulitis - once again I explained the nature of the condition in detail.  I believe MRI of the foot is necessary to eval for abscess or osteomyelitis.  Continue local wound care.  Nwb on the left foot.  Concur with IV abx.  Wylene Simmer 04/29/2021, 7:40 AM

## 2021-04-29 NOTE — Progress Notes (Signed)
PT Cancellation Note  Patient Details Name: [name] MRN: [id number] DOB: [birth year]   Cancelled Treatment:    Reason Eval/Treat Not Completed: Other (comment). Attempted to eval pt but he was up on [facility] for extended time. Tried again and was just back to bed and didn't want to get back up. Will continue attempts.    Angelina Ok [facility] 04/29/2021, 4:13 PM [name] PT Acute Rehabilitation Services Pager [phone] Office [phone]

## 2021-04-30 DIAGNOSIS — G621 Alcoholic polyneuropathy: Secondary | ICD-10-CM | POA: Diagnosis not present

## 2021-04-30 DIAGNOSIS — D649 Anemia, unspecified: Secondary | ICD-10-CM | POA: Diagnosis not present

## 2021-04-30 DIAGNOSIS — E11621 Type 2 diabetes mellitus with foot ulcer: Secondary | ICD-10-CM | POA: Diagnosis not present

## 2021-04-30 DIAGNOSIS — A419 Sepsis, unspecified organism: Secondary | ICD-10-CM | POA: Diagnosis not present

## 2021-04-30 LAB — CBC WITH DIFFERENTIAL/PLATELET
Abs Immature Granulocytes: 0.05 10*3/uL (ref 0.00–0.07)
Basophils Absolute: 0 10*3/uL (ref 0.0–0.1)
Basophils Relative: 1 %
Eosinophils Absolute: 0 10*3/uL (ref 0.0–0.5)
Eosinophils Relative: 1 %
HCT: 28.9 % — ABNORMAL LOW (ref 39.0–52.0)
Hemoglobin: 9.8 g/dL — ABNORMAL LOW (ref 13.0–17.0)
Immature Granulocytes: 1 %
Lymphocytes Relative: 16 %
Lymphs Abs: 0.9 10*3/uL (ref 0.7–4.0)
MCH: 32.8 pg (ref 26.0–34.0)
MCHC: 33.9 g/dL (ref 30.0–36.0)
MCV: 96.7 fL (ref 80.0–100.0)
Monocytes Absolute: 0.5 10*3/uL (ref 0.1–1.0)
Monocytes Relative: 9 %
Neutro Abs: 4.2 10*3/uL (ref 1.7–7.7)
Neutrophils Relative %: 72 %
Platelets: 140 10*3/uL — ABNORMAL LOW (ref 150–400)
RBC: 2.99 MIL/uL — ABNORMAL LOW (ref 4.22–5.81)
RDW: 15.6 % — ABNORMAL HIGH (ref 11.5–15.5)
WBC: 5.7 10*3/uL (ref 4.0–10.5)
nRBC: 0 % (ref 0.0–0.2)

## 2021-04-30 LAB — GLUCOSE, CAPILLARY
Glucose-Capillary: 101 mg/dL — ABNORMAL HIGH (ref 70–99)
Glucose-Capillary: 108 mg/dL — ABNORMAL HIGH (ref 70–99)
Glucose-Capillary: 118 mg/dL — ABNORMAL HIGH (ref 70–99)
Glucose-Capillary: 149 mg/dL — ABNORMAL HIGH (ref 70–99)
Glucose-Capillary: 81 mg/dL (ref 70–99)
Glucose-Capillary: 92 mg/dL (ref 70–99)

## 2021-04-30 LAB — BASIC METABOLIC PANEL
Anion gap: 8 (ref 5–15)
BUN: 8 mg/dL (ref 6–20)
CO2: 25 mmol/L (ref 22–32)
Calcium: 8.2 mg/dL — ABNORMAL LOW (ref 8.9–10.3)
Chloride: 99 mmol/L (ref 98–111)
Creatinine, Ser: 0.77 mg/dL (ref 0.61–1.24)
GFR, Estimated: >60 mL/min (ref >60)
Glucose, Bld: 87 mg/dL (ref 70–99)
Potassium: 3 mmol/L — ABNORMAL LOW (ref 3.5–5.1)
Sodium: 132 mmol/L — ABNORMAL LOW (ref 135–145)

## 2021-04-30 LAB — VANCOMYCIN, PEAK: Vancomycin Pk: 35 ug/mL (ref 30–40)

## 2021-04-30 LAB — VANCOMYCIN, TROUGH: Vancomycin Tr: 17 ug/mL (ref 15–20)

## 2021-04-30 MED ORDER — ADULT MULTIVITAMIN W/MINERALS CH: 1.0000 | ORAL_TABLET | Freq: Every day | ORAL | Status: DC

## 2021-04-30 MED ORDER — LORAZEPAM 1 MG PO TABS
1.0000 mg | ORAL_TABLET | ORAL | Status: DC | PRN
  Administered 2021-05-01: 1 mg via ORAL
  Filled 2021-04-30: qty 1

## 2021-04-30 MED ORDER — THIAMINE HCL 100 MG/ML IJ SOLN: 100.0000 mg | Freq: Every day | INTRAMUSCULAR | Status: DC

## 2021-04-30 MED ORDER — POTASSIUM CHLORIDE CRYS ER 20 MEQ PO TBCR
40.0000 meq | EXTENDED_RELEASE_TABLET | ORAL | Status: AC
  Administered 2021-04-30 (×2): 40 meq via ORAL
  Filled 2021-04-30 (×2): qty 2

## 2021-04-30 MED ORDER — THIAMINE HCL 100 MG PO TABS: 100.0000 mg | ORAL_TABLET | Freq: Every day | ORAL | Status: DC

## 2021-04-30 MED ORDER — FOLIC ACID 1 MG PO TABS: 1.0000 mg | ORAL_TABLET | Freq: Every day | ORAL | Status: DC

## 2021-04-30 MED ORDER — VANCOMYCIN HCL 1250 MG/250ML IV SOLN
1250.0000 mg | Freq: Two times a day (BID) | INTRAVENOUS | Status: DC
  Administered 2021-05-01 – 2021-05-06 (×12): 1250 mg via INTRAVENOUS
  Filled 2021-04-30 (×13): qty 250

## 2021-04-30 MED ORDER — LORAZEPAM 2 MG/ML IJ SOLN
1.0000 mg | INTRAMUSCULAR | Status: DC | PRN
  Administered 2021-04-30: 2 mg via INTRAVENOUS
  Filled 2021-04-30: qty 1

## 2021-04-30 NOTE — Progress Notes (Signed)
Mobility Specialist: Progress Note   04/30/21 1105  Mobility  Activity Transferred:  Chair to bed  Level of Assistance Minimal assist, patient does 75% or more  Assistive Device Front wheel walker  Distance Ambulated (ft) 2 ft  Mobility Out of bed to chair with meals  Mobility Response Tolerated fair  Mobility performed by Mobility specialist  $Mobility charge 1 Mobility   Pt assisted back to the bed per RN request. Upon entering room pt said that there was someone in the blankets in the chair in the corner because they have been moving while he was sitting in the recliner, RN present. Pt required minA to stand and was noncompliant with NWB precautions on his LLE. Pt is back in the bed with RN present in the room.   [facility] Shuntel Fishburn Mobility Specialist Mobility Specialist Phone: [phone]

## 2021-04-30 NOTE — Evaluation (Signed)
Physical Therapy Evaluation Patient Details Name: [name] MRN: [id number] DOB: 1961-05-09 Today's Date: 04/30/2021   History of Present Illness  [name] is a 60 y.o. male admitted for working diagnosis of severe sepsis due to L diabetic foot infection/ulcer. MRI shows possible early osteomyelitis, septic joint arthritis with infected tenosynovitis. Awaiting further orthopedic recommendations. PMH: HTN, obesity, HLD, prediabetes, alcoholic cirrhosis, alcohol abuse.  Clinical Impression  Pt presents to PT unable to maintain NWB on his LLE with OOB mobility. I do not think he will ever be able to amb and maintain NWB on LLE. Likely the best he can do is minimize the time he is putting weigh on that LLE. Will need w/c at home so he can [place] that to keep off of that foot. Will also work on scoot transfers to reduce the need to stand to get to w/c.     Follow Up Recommendations Home health PT    Equipment Recommendations  Wheelchair (measurements PT);Wheelchair cushion (measurements PT)    Recommendations for Other Services       Precautions / Restrictions Precautions Precautions: Fall Precaution Comments: L foot drainage Restrictions Weight Bearing Restrictions: Yes RLE Weight Bearing: Weight bearing as tolerated LLE Weight Bearing: Non weight bearing      Mobility  Bed Mobility Overal bed mobility: Modified Independent             General bed mobility comments: Pt up on BSC    Transfers Overall transfer level: Needs assistance Equipment used: Rolling walker (2 wheeled) Transfers: Sit to/from [employer] Sit to Stand: Min assist Stand pivot transfers: Min assist       General transfer comment: Assist for balance. Pt unable to maintain NWB on his LLE.  Ambulation/Gait Ambulation/Gait assistance: Min assist Gait Distance (Feet): 2 Feet Assistive device: Rolling walker (2 wheeled) Gait Pattern/deviations: Step-to pattern;Decreased step length -  right;Decreased step length - left Gait velocity: decr Gait velocity interpretation: <1.31 ft/sec, indicative of household ambulator General Gait Details: Assist for balance and support. Pt unable to maintain NWB so limited distance to a few steps  Stairs            Wheelchair Mobility    Modified Rankin (Stroke Patients Only)       Balance Overall balance assessment: Needs assistance Sitting-balance support: No upper extremity supported;Feet supported Sitting balance-Leahy Scale: Fair     Standing balance support: Single extremity supported;During functional activity Standing balance-Leahy Scale: Poor Standing balance comment: reliant on at least one UE support                             Pertinent Vitals/Pain Pain Assessment: Faces Faces Pain Scale: Hurts a little bit Pain Location: L foot Pain Descriptors / Indicators: Guarding Pain Intervention(s): Monitored during session    Home Living Family/patient expects to be discharged to:: Private residence Living Arrangements: Spouse/significant other Available Help at Discharge: Family Type of Home: House Home Access: Stairs to enter Entrance Stairs-Rails: Doctor, general practice of Steps: 3 Home Layout: Two level;Able to live on main level with bedroom/bathroom Home Equipment: Walker - 4 wheels;Shower seat      Prior Function Level of Independence: Independent with assistive device(s)         Comments: reports using Rollator for mobility though attempting to keep off of foot as much as possible. Was weightbearing with mobility. Able to complete ADLs though took increased time, sponge bathing and washing hair in  sink to avoid showers/getting L foot wet.     Hand Dominance   Dominant Hand: Right    Extremity/Trunk Assessment   Upper Extremity Assessment Upper Extremity Assessment: Defer to OT evaluation    Lower Extremity Assessment Lower Extremity Assessment: Generalized  weakness    Cervical / Trunk Assessment Cervical / Trunk Assessment: Normal  Communication   Communication: No difficulties  Cognition Arousal/Alertness: Awake/alert Behavior During Therapy: WFL for tasks assessed/performed Overall Cognitive Status: No family/caregiver present to determine baseline cognitive functioning                                 General Comments: Question if pt was hallucinating. He talked about people looking in at him from the other room.      General Comments General comments (skin integrity, edema, etc.): VSS on RA. Drainage noted on bottom of L foot - RN aware    Exercises     Assessment/Plan    PT Assessment Patient needs continued PT services  PT Problem List Decreased strength;Decreased balance;Decreased mobility;Decreased knowledge of precautions;Decreased knowledge of use of DME;Obesity       PT Treatment Interventions DME instruction;Gait training;Stair training;Functional mobility training;Therapeutic activities;Therapeutic exercise;Balance training;Patient/family education;Wheelchair mobility training    PT Goals (Current goals can be found in the Care Plan section)  Acute Rehab PT Goals Patient Stated Goal: figure out what is going on with foot; for foot to heal PT Goal Formulation: With patient Time For Goal Achievement: 05/14/21 Potential to Achieve Goals: Good    Frequency Min 3X/week   Barriers to discharge        Co-evaluation               AM-PAC PT "6 Clicks" Mobility  Outcome Measure Help needed turning from your back to your side while in a flat bed without using bedrails?: None Help needed moving from lying on your back to sitting on the side of a flat bed without using bedrails?: None Help needed moving to and from a bed to a chair (including a wheelchair)?: A Little Help needed standing up from a chair using your arms (e.g., wheelchair or bedside chair)?: A Little Help needed to walk in hospital  room?: A Little Help needed climbing 3-5 steps with a railing? : A Little 6 Click Score: 20    End of Session   Activity Tolerance: Patient tolerated treatment well Patient left: in chair;with call bell/phone within reach;with chair alarm set Nurse Communication: Mobility status PT Visit Diagnosis: Other abnormalities of gait and mobility (R26.89);Muscle weakness (generalized) (M62.81)    Time: 2683-4196 PT Time Calculation (min) (ACUTE ONLY): 14 min   Charges:   PT Evaluation $PT Eval Moderate Complexity: 1 Mod          [facility] PT Acute Rehabilitation Services Pager [phone] Office [phone]   Angelina Ok [facility] 04/30/2021, 11:23 AM

## 2021-04-30 NOTE — Progress Notes (Signed)
Pharmacy Antibiotic Note  [name] is a 60 y.o. male admitted on 04/27/2021 with left swollen foot.  Continues on Vancomycin  and Cefepime  Vancomycin levels drawn today - > VP 35 and VT 17 to give elevated AUC of 590  Plan: Decrease Vancomycin to 1250 mg iv Q 12 hours (predicted AUC of 490)  Cefepime 2gm IV q8h  Follow renal function and clinical course  Height: 6\' 1"  (185.4 cm) Weight: 127 kg (280 lb) IBW/kg (Calculated) : 79.9  Temp (24hrs), Avg:98 F (36.7 C), Min:97.5 F (36.4 C), Max:98.8 F (37.1 C)  Recent Labs  Lab 04/27/21 2130 04/27/21 2253 04/28/21 0234 04/28/21 0239 04/28/21 0506 04/28/21 1809 04/29/21 0149 04/29/21 0630 04/29/21 0757 04/30/21 0440 04/30/21 1227  WBC 15.9*  --   --   --  12.1*  --   --  6.6  --  5.7  --   CREATININE 0.78  --   --  0.73 0.71  --  0.75  --   --  0.77  --   LATICACIDVEN  --  4.0* 3.6*  --  3.6* 2.3*  --   --  1.2  --   --   VANCOTROUGH  --   --   --   --   --   --   --   --   --   --  17  VANCOPEAK  --   --   --   --   --   --   --   --   --  35  --      Estimated Creatinine Clearance: 138.8 mL/min (by C-G formula based on SCr of 0.77 mg/dL).    Thank you 05/02/21, PharmD  04/30/2021, 3:20 PM

## 2021-04-30 NOTE — Evaluation (Signed)
Occupational Therapy Evaluation Patient Details Name: [name] MRN: [id number] DOB: 05-01-1961 Today's Date: 04/30/2021    History of Present Illness [name] is a 60 y.o. male admitted for working diagnosis of severe sepsis due to L diabetic foot infection/ulcer. MRI shows possible early osteomyelitis, septic joint arthritis with infected tenosynovitis. Awaiting further orthopedic recommendations. PMH: HTN, obesity, HLD, prediabetes, alcoholic cirrhosis, alcohol abuse.   Clinical Impression   PTA, pt lives with spouse and reports ambulating with Rollator though tried to keep weight off of foot as much as possible. Pt reports Modified Independence with ADLs. Educated pt on NWB L LE recommendations from ortho with pt noncompliant, reporting that it is "impossible" to do so. Pt overall Min A for basic transfers though noted to be putting weight through L LE. Pt overall Independent for UB ADLs and up to Mod A for LB ADLs. Will continue to follow acutely with recommendation of HHOT follow-up at DC.     Follow Up Recommendations  Home health OT;Supervision - Intermittent    Equipment Recommendations  3 in 1 bedside commode;Wheelchair (measurements OT);Wheelchair cushion (measurements OT);Other (comment) (Rolling walker)    Recommendations for Other Services       Precautions / Restrictions Precautions Precautions: Fall Precaution Comments: L foot drainage Restrictions Weight Bearing Restrictions: Yes LLE Weight Bearing: Non weight bearing      Mobility Bed Mobility Overal bed mobility: Modified Independent             General bed mobility comments: increased time, use of bedrails    Transfers Overall transfer level: Needs assistance Equipment used: 1 person hand held assist Transfers: Sit to/from [employer] Sit to Stand: Min guard Stand pivot transfers: Min assist       General transfer comment: Min A via handheld assist. Planned to guide pt in  squat pivot to BSC. However, pt noncompliant with WB precautions and took steps to [facility]    Balance Overall balance assessment: Needs assistance Sitting-balance support: No upper extremity supported;Feet supported Sitting balance-Leahy Scale: Fair     Standing balance support: Single extremity supported;During functional activity Standing balance-Leahy Scale: Poor Standing balance comment: reliant on at least one UE support                           ADL either performed or assessed with clinical judgement   ADL Overall ADL's : Needs assistance/impaired Eating/Feeding: Independent;Sitting   Grooming: Independent;Sitting   Upper Body Bathing: Independent;Sitting   Lower Body Bathing: Minimal assistance;Sitting/lateral leans   Upper Body Dressing : Independent;Sitting   Lower Body Dressing: Moderate assistance;Sitting/lateral leans Lower Body Dressing Details (indicate cue type and reason): assist to don R sock sitting EOB. reports typically propping LE up on bed. Increased difficulty manuevering L foot Toilet Transfer: Minimal Chartered loss adjuster Details (indicate cue type and reason): Min A with handheld assist, poor adherence to NWB precautions Toileting- Clothing Manipulation and Hygiene: Minimal assistance;Sitting/lateral lean         General ADL Comments: Noted current recommendations are for NWB to L LE though pt reports this is "impossible" to attempt/maintain. Pt opts for WB to L foot despite education     Vision Patient Visual Report: No change from baseline Vision Assessment?: No apparent visual deficits     Perception     Praxis      Pertinent Vitals/Pain Pain Assessment: Faces Faces Pain Scale: Hurts a little bit Pain Location: L foot Pain Descriptors /  Indicators: Guarding Pain Intervention(s): Monitored during session;Limited activity within patient's tolerance     Hand Dominance Right   Extremity/Trunk Assessment  Upper Extremity Assessment Upper Extremity Assessment: Overall WFL for tasks assessed   Lower Extremity Assessment Lower Extremity Assessment: Defer to PT evaluation   Cervical / Trunk Assessment Cervical / Trunk Assessment: Normal   Communication Communication Communication: No difficulties   Cognition Arousal/Alertness: Awake/alert Behavior During Therapy: WFL for tasks assessed/performed Overall Cognitive Status: Within Functional Limits for tasks assessed                                 General Comments: A&Ox4, noncompliant with NWB precautions   General Comments  VSS on RA. Drainage noted on bottom of L foot - RN aware    Exercises     Shoulder Instructions      Home Living Family/patient expects to be discharged to:: Private residence Living Arrangements: Spouse/significant other Available Help at Discharge: Family Type of Home: House Home Access: Stairs to enter [employer] of Steps: 3 Entrance Stairs-Rails: Right;Left Home Layout: Two level;Able to live on main level with bedroom/bathroom     Bathroom Shower/Tub: Producer, television/film/video: Standard     Home Equipment: Environmental consultant - 4 wheels;Shower seat          Prior Functioning/Environment Level of Independence: Independent with assistive device(s)        Comments: reports using Rollator for mobility though attempting to keep off of foot as much as possible. Was weightbearing with mobility. Able to complete ADLs though took increased time, sponge bathing and washing hair in sink to avoid showers/getting L foot wet.        OT Problem List: Decreased activity tolerance;Impaired balance (sitting and/or standing);Decreased knowledge of precautions;Pain;Increased edema      OT Treatment/Interventions: Self-care/ADL training;Therapeutic exercise;Energy conservation;DME and/or AE instruction;Therapeutic activities;Patient/family education;Balance training    OT Goals(Current  goals can be found in the care plan section) Acute Rehab OT Goals Patient Stated Goal: figure out what is going on with foot; for foot to heal OT Goal Formulation: With patient Time For Goal Achievement: 05/14/21 Potential to Achieve Goals: Good  OT Frequency: Min 2X/week   Barriers to D/C:            Co-evaluation              AM-PAC OT "6 Clicks" Daily Activity     Outcome Measure Help from another person eating meals?: None Help from another person taking care of personal grooming?: None Help from another person toileting, which includes using toliet, bedpan, or urinal?: A Little Help from another person bathing (including washing, rinsing, drying)?: A Little Help from another person to put on and taking off regular upper body clothing?: None Help from another person to put on and taking off regular lower body clothing?: A Lot 6 Click Score: 20   End of Session Nurse Communication: Mobility status  Activity Tolerance: Patient tolerated treatment well Patient left: Other (comment);with call bell/phone within reach (on [facility] with call bell within reach as pt reported he needed some time on [facility]; pt instructed to notify when ready to get up from [facility])  OT Visit Diagnosis: Other abnormalities of gait and mobility (R26.89);Pain Pain - Right/Left: Left Pain - part of body: Ankle and joints of foot                Time: 4132-4401 OT  Time Calculation (min): 21 min Charges:  OT General Charges $OT Visit: 1 Visit OT Evaluation $OT Eval Moderate Complexity: 1 Mod  Bradd Canary, OTR/L Acute Rehab Services Office: [phone]   Lorre Munroe 04/30/2021, 10:08 AM

## 2021-04-30 NOTE — Progress Notes (Signed)
Subjective: [name] is still on IV antibiotics.  He is tolerating a regular diet.  His wife is at the bedside this evening.  He denies any pain in his foot.  They report that his swelling is improving some.   Objective: Vital signs in last 24 hours: Temp:  [97.5 F (36.4 C)-98.8 F (37.1 C)] 97.6 F (36.4 C) (07/14 1611) Pulse Rate:  [44-124] 95 (07/14 1611) Resp:  [18-20] 20 (07/14 1611) BP: (110-145)/(51-79) 110/60 (07/14 1611) SpO2:  [93 %-100 %] 98 % (07/14 1611)  Intake/Output from previous day: 07/13 0701 - 07/14 0700 In: 2082.2 [P.O.:1390; I.V.:192.2; IV Piggyback:500] Out: 1226 [Urine:1225; Stool:1] Intake/Output this shift: No intake/output data recorded.  Recent Labs    04/27/21 2130 04/28/21 0506 04/29/21 0630 04/30/21 0440  HGB 11.6* 10.6* 9.9* 9.8*   Recent Labs    04/29/21 0630 04/30/21 0440  WBC 6.6 5.7  RBC 3.05* 2.99*  HCT 28.8* 28.9*  PLT 142* 140*   Recent Labs    04/29/21 0149 04/30/21 0440  NA 131* 132*  K 3.7 3.0*  CL 99 99  CO2 21* 25  BUN 9 8  CREATININE 0.75 0.77  GLUCOSE 96 87  CALCIUM 7.7* 8.2*   Recent Labs    04/28/21 0506  INR 1.3*    Physical exam: Well-nourished well-developed man in no apparent distress.  Alert.  Extraocular motions are intact.  Respirations are unlabored.  The left lower extremity is swollen around the ankle.  There is minimal erythema.  The wound is dressed and dry.  MRI: I explained the MRI findings to the patient and his wife in detail.  He has osteomyelitis at the fifth metatarsal and reactive changes at the cuboid adjacent to the ulcer.  He also has a fluid collection around the ankle that appears to communicate with the ankle and is consistent with septic arthritis.  Assessment/Plan: Left foot diabetic ulcer and underlying osteomyelitis -I explained the nature of the MRI findings to the patient and his wife in detail.  We discussed treatment options and their relevant risks and benefits in  detail.  To date his ulcer has not healed despite extensive wound care, time and antibiotics.  He has developed infection around the ankle that led to sepsis requiring admission.  MRI shows osteomyelitis deep to the ulcer.  At this point I believe below-knee amputation is indicated to treat the chronic nonhealing ulcer and now extensive left lower extremity infection that threatens both limb and life.  I have explained the risks and benefits of the treatment options to the patient and his wife in detail.  They would like some time to think over these options before deciding.  I anticipate scheduling him for surgery early next week.  I will continue to follow.     Toni Arthurs 04/30/2021, 7:27 PM

## 2021-04-30 NOTE — Progress Notes (Signed)
PROGRESS NOTE    [name]  YSA:[id number] DOB: 10/04/1961 DOA: 04/27/2021 PCP: [name], MD    Brief Narrative:  [name] was admitted to the hospital with the working diagnosis of severe sepsis due to left diabetic foot infection, possible osteomyelitis.    60 year old male past medical history for type 2 diabetes mellitus, hypertension, heart failure, alcohol abuse, obesity and obstructive sleep apnea who presented with left foot infection.  Reported chronic left foot that lately developed worsening edema and expanding erythema.  On the day of hospitalization he was seen at the outpatient clinic and was referred to the hospital for IV antibiotics.  On his initial physical examination his blood pressure was 147/112, heart rate 128, respiratory rate 12, temperature 98.8, oxygen saturation 93%, his lungs were clear to auscultation bilaterally, heart S1 -S2, present, tachycardic, his abdomen was soft nontender, his left leg was edematous, positive distal lymphedema extending up to the mid leg, midfoot plantar deep ulcerated wound.   Sodium 132, potassium 3.0, chloride 92, bicarb 23, glucose 156, BUN 10, creatinine 0.78, lactic acid 4.0, white count 15.9, hemoglobin 11.6, hematocrit 34.0, platelets 154 SARS COVID-19 negative.   Urinalysis specific gravity 1.012, urine sodium less than 10, urine osmolality 379.   Left foot radiograph with considerable soft tissue swelling and plantar diabetic foot ulcer.  Mottled appearance of the base of the fifth metatarsal.   EKG 115 bpm, normal axis, normal intervals, sinus rhythm, no significant ST segment or T wave changes.   Patient placed on broad spectrum antibiotic therapy. Further workup with foot MRI showed large plantar ulceration with sinus tract underlying the 5th metatarsal. Suspected 5 th metatarsal osteomyelitis.  Left ankle with signs of septic arthritis with infected tenosynovitis.   Assessment & Plan:   Principal Problem:    Severe sepsis (HCC) Active Problems:   Hypertension   Morbid obesity (HCC)   OSA (obstructive sleep apnea)   Hyperlipidemia   Prediabetes   Ulcer of left heel (HCC)   Anemia   Hypothyroidism   Alcoholic cirrhosis of liver without ascites (HCC)   Alcoholic peripheral neuropathy (HCC)   Osteomyelitis (HCC)   Alcohol abuse   Diabetic foot ulcer (HCC)   Left foot diabetic foot infection/ cellulitis/ possible osteomyelitis, complicated with severe sepsis (end-organ damage elevated lactic acid) present on admission. MRI with signs of osteomyelitis of the 5th metatarsal and septic joint of the left ankle.  Wbc is 5,7 this am, he continue to be afebrile.   Antibiotic therapy with vancomycin and cefepime. Pending final recommendations from orthopedic surgery.    2. Alcohol abuse/ chronic alcoholic cirrhosis.  Today with worsening withdrawal symptoms.  Resumed lorazepam per CIWA protocol with good toleration. Continue close neurologic monitoring.  Thiamine and multivitamins.   3. Chronic diastolic congested heart failure/ HTN  Continue blood pressure monitoring, no signs of exacerbation.   4, Hypothyroid On levothyroxine.   5. Obesity class 2. OSA calculated BMI is 36,9. On Cpap at night. Consult PT and Ot   6. T2DM. Fasting glucose is 87, capillary 108, 118 and 81, patient with poor oral intake. Continue insulin sliding scale for glucose cover and monitoring  Diabetic neuropathy, continue with gabapentin.   7. Hypokalemia. Add Kcl 40 meq x 2 doses and follow up renal function in am.   8. Depression. Continue with duloxetine, trazodone and hydroxyzine.   Patient continue to be at high risk for worsening sepsis   Status is: Inpatient  Remains inpatient appropriate because:Inpatient level of  care appropriate due to severity of illness  Dispo: The patient is from: Home              Anticipated d/c is to: Home              Patient currently is not medically stable to d/c.    Difficult to place patient No   DVT prophylaxis: Enoxaparin   Code Status:   full  Family Communication:  I spoke with patient's girl friend at the bedside, we talked in detail about patient's condition, plan of care and prognosis and all questions were addressed.      Nutrition Status: Nutrition Problem: Increased nutrient needs Etiology: wound healing Signs/Symptoms: estimated needs Interventions: Glucerna shake     Skin Documentation:     Consultants:  Orthopedics   Antimicrobials:  Vancomycin  Cefepime     Subjective: Patient with worsening withdrawal symptoms, that have responded to lorazepam per protocol., poor oral intake, no nausea or vomiting,   Objective: Vitals:   04/30/21 1010 04/30/21 1108 04/30/21 1210 04/30/21 1611  BP: (!) 141/76 131/70 135/70 110/60  Pulse: (!) 44 99 95 95  Resp: 19  20 20   Temp: 97.8 F (36.6 C) (!) 97.5 F (36.4 C) 97.6 F (36.4 C) 97.6 F (36.4 C)  TempSrc: Oral Oral Oral Oral  SpO2: 100% 93% 95% 98%  Weight:      Height:        Intake/Output Summary (Last 24 hours) at 04/30/2021 1653 Last data filed at 04/30/2021 1613 Gross per 24 hour  Intake 1550 ml  Output 451 ml  Net 1099 ml   Filed Weights   04/27/21 1950  Weight: 127 kg    Examination:   General: Not in pain or dyspnea, deconditioned and ill looking appearing  Neurology: Awake and alert, non focal  E ENT: mild pallor, no icterus, oral mucosa moist Cardiovascular: No JVD. S1-S2 present, rhythmic, no gallops, rubs, or murmurs. Left  lower extremity edema, non pitting, dressing is in place.  Pulmonary: positive breath sounds bilaterally, adequate air movement, no wheezing, rhonchi or rales. Gastrointestinal. Abdomen soft and non tender Skin. No rashes Musculoskeletal: no joint deformities     Data Reviewed: I have personally reviewed following labs and imaging studies  CBC: Recent Labs  Lab 04/27/21 2130 04/28/21 0506 04/29/21 0630 04/30/21 0440   WBC 15.9* 12.1* 6.6 5.7  NEUTROABS 12.2* 9.8* 5.2 4.2  HGB 11.6* 10.6* 9.9* 9.8*  HCT 34.0* 31.7* 28.8* 28.9*  MCV 95.2 95.8 94.4 96.7  PLT 154 147* 142* 140*   Basic Metabolic Panel: Recent Labs  Lab 04/27/21 2130 04/28/21 0239 04/28/21 0506 04/29/21 0149 04/30/21 0440  NA 132* 132* 132* 131* 132*  K 3.0* 3.3* 3.3* 3.7 3.0*  CL 92* 93* 95* 99 99  CO2 23 24 23  21* 25  GLUCOSE 156* 135* 127* 96 87  BUN 10 10 10 9 8   CREATININE 0.78 0.73 0.71 0.75 0.77  CALCIUM 8.4* 8.2* 7.8* 7.7* 8.2*  MG  --  1.2* 1.3* 1.9  --   PHOS  --  3.2 3.1  --   --    GFR: Estimated Creatinine Clearance: 138.8 mL/min (by C-G formula based on SCr of 0.77 mg/dL). Liver Function Tests: Recent Labs  Lab 04/28/21 0239 04/28/21 0506  AST 39 35  ALT 23 22  ALKPHOS 147* 150*  BILITOT 3.7* 3.8*  PROT 7.7 6.5  ALBUMIN 2.4* 2.3*   No results for input(s): LIPASE, AMYLASE in the  last 168 hours. No results for input(s): AMMONIA in the last 168 hours. Coagulation Profile: Recent Labs  Lab 04/28/21 0506  INR 1.3*   Cardiac Enzymes: Recent Labs  Lab 04/28/21 0239 04/28/21 0506  CKTOTAL 26* 56   BNP (last 3 results) Recent Labs    11/24/20 1343  PROBNP 26.0   HbA1C: Recent Labs    04/27/21 2253  HGBA1C 6.6*   CBG: Recent Labs  Lab 04/29/21 2335 04/30/21 0435 04/30/21 0817 04/30/21 1126 04/30/21 1611  GLUCAP 118* 92 108* 118* 81   Lipid Profile: No results for input(s): CHOL, HDL, LDLCALC, TRIG, CHOLHDL, LDLDIRECT in the last 72 hours. Thyroid Function Tests: Recent Labs    04/28/21 0506  TSH 0.787   Anemia Panel: No results for input(s): VITAMINB12, FOLATE, FERRITIN, TIBC, IRON, RETICCTPCT in the last 72 hours.    Radiology Studies: I have reviewed all of the imaging during this hospital visit personally     Scheduled Meds:  DULoxetine  20 mg Oral Daily   feeding supplement (GLUCERNA SHAKE)  237 mL Oral TID BM   folic acid  1 mg Oral Daily   gabapentin  100  mg Oral TID   insulin aspart  0-9 Units Subcutaneous Q4H   levothyroxine  50 mcg Oral Q0600   multivitamin with minerals  1 tablet Oral Daily   sodium chloride flush  3 mL Intravenous Q12H   thiamine  100 mg Oral Daily   Continuous Infusions:  sodium chloride     ceFEPime (MAXIPIME) IV 2 g (04/30/21 0646)   [START ON 05/01/2021] vancomycin       LOS: 3 days        Shatara Stanek Annett Gula, MD

## 2021-05-01 DIAGNOSIS — K7030 Alcoholic cirrhosis of liver without ascites: Secondary | ICD-10-CM | POA: Diagnosis not present

## 2021-05-01 DIAGNOSIS — E039 Hypothyroidism, unspecified: Secondary | ICD-10-CM

## 2021-05-01 DIAGNOSIS — E782 Mixed hyperlipidemia: Secondary | ICD-10-CM

## 2021-05-01 DIAGNOSIS — D649 Anemia, unspecified: Secondary | ICD-10-CM | POA: Diagnosis not present

## 2021-05-01 DIAGNOSIS — A419 Sepsis, unspecified organism: Secondary | ICD-10-CM | POA: Diagnosis not present

## 2021-05-01 LAB — CBC WITH DIFFERENTIAL/PLATELET
Abs Immature Granulocytes: 0.04 10*3/uL (ref 0.00–0.07)
Basophils Absolute: 0 10*3/uL (ref 0.0–0.1)
Basophils Relative: 1 %
Eosinophils Absolute: 0 10*3/uL (ref 0.0–0.5)
Eosinophils Relative: 1 %
HCT: 29.6 % — ABNORMAL LOW (ref 39.0–52.0)
Hemoglobin: 10 g/dL — ABNORMAL LOW (ref 13.0–17.0)
Immature Granulocytes: 1 %
Lymphocytes Relative: 20 %
Lymphs Abs: 0.8 10*3/uL (ref 0.7–4.0)
MCH: 32.7 pg (ref 26.0–34.0)
MCHC: 33.8 g/dL (ref 30.0–36.0)
MCV: 96.7 fL (ref 80.0–100.0)
Monocytes Absolute: 0.4 10*3/uL (ref 0.1–1.0)
Monocytes Relative: 10 %
Neutro Abs: 2.7 10*3/uL (ref 1.7–7.7)
Neutrophils Relative %: 67 %
Platelets: 158 10*3/uL (ref 150–400)
RBC: 3.06 MIL/uL — ABNORMAL LOW (ref 4.22–5.81)
RDW: 15.7 % — ABNORMAL HIGH (ref 11.5–15.5)
WBC: 3.9 10*3/uL — ABNORMAL LOW (ref 4.0–10.5)
nRBC: 0 % (ref 0.0–0.2)

## 2021-05-01 LAB — BASIC METABOLIC PANEL
Anion gap: 5 (ref 5–15)
BUN: 7 mg/dL (ref 6–20)
CO2: 25 mmol/L (ref 22–32)
Calcium: 8.5 mg/dL — ABNORMAL LOW (ref 8.9–10.3)
Chloride: 104 mmol/L (ref 98–111)
Creatinine, Ser: 0.67 mg/dL (ref 0.61–1.24)
GFR, Estimated: >60 mL/min (ref >60)
Glucose, Bld: 84 mg/dL (ref 70–99)
Potassium: 3.3 mmol/L — ABNORMAL LOW (ref 3.5–5.1)
Sodium: 134 mmol/L — ABNORMAL LOW (ref 135–145)

## 2021-05-01 LAB — GLUCOSE, CAPILLARY
Glucose-Capillary: 81 mg/dL (ref 70–99)
Glucose-Capillary: 86 mg/dL (ref 70–99)
Glucose-Capillary: 114 mg/dL — ABNORMAL HIGH (ref 70–99)
Glucose-Capillary: 109 mg/dL — ABNORMAL HIGH (ref 70–99)
Glucose-Capillary: 85 mg/dL (ref 70–99)
Glucose-Capillary: 94 mg/dL (ref 70–99)

## 2021-05-01 MED ORDER — POTASSIUM CHLORIDE 20 MEQ PO PACK
40.0000 meq | PACK | ORAL | Status: AC
  Administered 2021-05-01 (×2): 40 meq via ORAL
  Filled 2021-05-01 (×2): qty 2

## 2021-05-01 NOTE — Progress Notes (Signed)
Subjective:     Patient denies any pain.  Dr. Victorino Dike discussed MRI results with patient yesterday.  BKA recommended.   Objective:   VITALS:  Temp:  [97.5 F (36.4 C)-98.8 F (37.1 C)] 97.8 F (36.6 C) (07/15 0349) Pulse Rate:  [44-117] 98 (07/14 2344) Resp:  [17-20] 17 (07/14 2344) BP: (110-142)/(60-96) 134/75 (07/15 0349) SpO2:  [93 %-100 %] 96 % (07/15 0349)  General: WDWN patient in NAD. Psych:  Appropriate mood and affect. Neuro:  A&O x 3, Moving all extremities, sensation intact to light touch HEENT:  EOMs intact Chest:  Even non-labored respirations Skin:  Dressing to L LE C/D/I, no rashes or lesions.  Malodorous aroma in the room. Extremities: warm/dry, moderate edema, erythema to Left LE  No lymphadenopathy. Pulses: Popliteus 2+ MSK:  ROM: EHL/FHL intact, MMT: able to perform quad set   LABS Recent Labs    04/29/21 0630 04/30/21 0440 05/01/21 0119  HGB 9.9* 9.8* 10.0*  WBC 6.6 5.7 3.9*  PLT 142* 140* 158   Recent Labs    04/30/21 0440 05/01/21 0119  NA 132* 134*  K 3.0* 3.3*  CL 99 104  CO2 25 25  BUN 8 7  CREATININE 0.77 0.67  GLUCOSE 87 84   No results for input(s): LABPT, INR in the last 72 hours.   Assessment/Plan:    Chronic L foot diabetic ulcer and osteomyelitis  Currently the patient is leaning toward trying 4-6 IV ABX Dr. Victorino Dike and I will tentatively plan to take the patient to the OR on Tuesday for L BKA We will check back on the patient on Monday to see what he decides.   Alfredo Martinez PA-C [facility] Office:  [phone]

## 2021-05-01 NOTE — Care Management Important Message (Signed)
Important Message  Patient Details  Name: [name] MRN: [id number] Date of Birth: [birth year]   Medicare Important Message Given:  Yes     Renie Ora 05/01/2021, 9:04 AM

## 2021-05-01 NOTE — Progress Notes (Signed)
PROGRESS NOTE    [name]  UEA:[id number] DOB: January 27, 1961 DOA: 04/27/2021 PCP: [name], MD    Brief Narrative:  [name] was admitted to the hospital with the working diagnosis of severe sepsis due to left diabetic foot infection, possible osteomyelitis.    60 year old male past medical history for type 2 diabetes mellitus, hypertension, heart failure, alcohol abuse, obesity and obstructive sleep apnea who presented with left foot infection.  Reported chronic left foot that lately developed worsening edema and expanding erythema.  On the day of hospitalization he was seen at the outpatient clinic and was referred to the hospital for IV antibiotics.  On his initial physical examination his blood pressure was 147/112, heart rate 128, respiratory rate 12, temperature 98.8, oxygen saturation 93%, his lungs were clear to auscultation bilaterally, heart S1 -S2, present, tachycardic, his abdomen was soft nontender, his left leg was edematous, positive distal lymphedema extending up to the mid leg, midfoot plantar deep ulcerated wound.   Sodium 132, potassium 3.0, chloride 92, bicarb 23, glucose 156, BUN 10, creatinine 0.78, lactic acid 4.0, white count 15.9, hemoglobin 11.6, hematocrit 34.0, platelets 154 SARS COVID-19 negative.   Urinalysis specific gravity 1.012, urine sodium less than 10, urine osmolality 379.   Left foot radiograph with considerable soft tissue swelling and plantar diabetic foot ulcer.  Mottled appearance of the base of the fifth metatarsal.   EKG 115 bpm, normal axis, normal intervals, sinus rhythm, no significant ST segment or T wave changes.   Patient placed on broad spectrum antibiotic therapy. Further workup with foot MRI showed large plantar ulceration with sinus tract underlying the 5th metatarsal. Suspected 5 th metatarsal osteomyelitis. Left ankle with signs of septic arthritis with infected tenosynovitis.    Orthopedics recommendation for below the knee  amputation.    Assessment & Plan:   Principal Problem:   Severe sepsis (HCC) Active Problems:   Hypertension   Morbid obesity (HCC)   OSA (obstructive sleep apnea)   Hyperlipidemia   Prediabetes   Ulcer of left heel (HCC)   Anemia   Hypothyroidism   Alcoholic cirrhosis of liver without ascites (HCC)   Alcoholic peripheral neuropathy (HCC)   Osteomyelitis (HCC)   Alcohol abuse   Diabetic foot ulcer (HCC)    Left foot diabetic foot infection/ cellulitis/ possible osteomyelitis, complicated with severe sepsis (end-organ damage elevated lactic acid) present on admission. MRI with signs of osteomyelitis of the 5th metatarsal and septic joint of the left ankle.  Today wbc is 3,9. Blood cultures with no growth.  Orthopedics has recommended below the knee amputation on the left lower extremity.    Continue with vancomycin and cefepime. I have explained to the patient in detail his condition, severe left foot infection including bone and ankle joint involvement. Risk of worsening infection, blood stream invasion and death.  He is not decided yet about surgery.    2. Alcohol abuse/ chronic alcoholic cirrhosis.  His mentation has improved today, able to sleep well last night. He had 2 mg IV lorazepam yesterday mid day.  Plan to continue with CIWA protocol and multivitamins.    3. Chronic diastolic congested heart failure/ HTN  No heart failure exacerbation, continue holding antihypertensive medications.   4, Hypothyroid Continue with levothyroxine.   5. Obesity class 2. OSA calculated BMI is 36,9. continue with Cpap at night. Patient will need home health services, continue with PT and OT.    6. T2DM. Glucose continue to be controlled, fasting glucose is 84  mg/dl.  On insulin sliding scale for glucose cover and monitoring For diabetic neuropathy, continue with gabapentin.   7. Hypokalemia/ hyponatremia. Stable renal function with serum cr at 0,67, K is 3,3 and Na 134, with  serum bicarbonate at 25. Add 80 meq KCL today in 2 dived doses.  Follow up renal function and electrolytes in am.    8. Depression. On duloxetine, trazodone and hydroxyzine.    Patient continue to be at high risk for worsening sepsis   Status is: Inpatient  Remains inpatient appropriate because:Inpatient level of care appropriate due to severity of illness  Dispo: The patient is from: Home              Anticipated d/c is to: Home              Patient currently is not medically stable to d/c.   Difficult to place patient No   DVT prophylaxis: Enoxaparin   Code Status:   full  Family Communication:  I spoke over the phone with the patient's girl friend about patient's  condition, plan of care, prognosis and all questions were addressed.      Nutrition Status: Nutrition Problem: Increased nutrient needs Etiology: wound healing Signs/Symptoms: estimated needs Interventions: Glucerna shake    Consultants:  Orthopedics   Antimicrobials:  Vancomycin and cefepime     Subjective: Patient with no nausea or vomiting, this am is more calm and awake. Continue to have left foot edema and erythema.   Objective: Vitals:   04/30/21 2016 04/30/21 2344 05/01/21 0349 05/01/21 0817  BP: (!) 127/96 (!) 142/70 134/75 (!) 146/72  Pulse: (!) 110 98  90  Resp:  17  18  Temp: 97.9 F (36.6 C) 97.6 F (36.4 C) 97.8 F (36.6 C) 97.8 F (36.6 C)  TempSrc: Oral Oral  Oral  SpO2: 93% 93% 96% 98%  Weight:      Height:        Intake/Output Summary (Last 24 hours) at 05/01/2021 0841 Last data filed at 05/01/2021 0824 Gross per 24 hour  Intake 400 ml  Output 1825 ml  Net -1425 ml   Filed Weights   04/27/21 1950  Weight: 127 kg    Examination:   General: Not in pain or dyspnea, deconditioned  Neurology: Awake and alert, non focal, no tremors  E ENT: no pallor, no icterus, oral mucosa moist Cardiovascular: No JVD. S1-S2 present, rhythmic, no gallops, rubs, or murmurs. No lower  extremity edema. Pulmonary: positive breath sounds bilaterally, adequate air movement, no wheezing, rhonchi or rales. Gastrointestinal. Abdomen soft and non tender Skin. Right foot with edema and erythema, dressing in place.  Musculoskeletal: no joint deformities   Data Reviewed: I have personally reviewed following labs and imaging studies  CBC: Recent Labs  Lab 04/27/21 2130 04/28/21 0506 04/29/21 0630 04/30/21 0440 05/01/21 0119  WBC 15.9* 12.1* 6.6 5.7 3.9*  NEUTROABS 12.2* 9.8* 5.2 4.2 2.7  HGB 11.6* 10.6* 9.9* 9.8* 10.0*  HCT 34.0* 31.7* 28.8* 28.9* 29.6*  MCV 95.2 95.8 94.4 96.7 96.7  PLT 154 147* 142* 140* 158   Basic Metabolic Panel: Recent Labs  Lab 04/28/21 0239 04/28/21 0506 04/29/21 0149 04/30/21 0440 05/01/21 0119  NA 132* 132* 131* 132* 134*  K 3.3* 3.3* 3.7 3.0* 3.3*  CL 93* 95* 99 99 104  CO2 24 23 21* 25 25  GLUCOSE 135* 127* 96 87 84  BUN 10 10 9 8 7   CREATININE 0.73 0.71 0.75 0.77 0.67  CALCIUM  8.2* 7.8* 7.7* 8.2* 8.5*  MG 1.2* 1.3* 1.9  --   --   PHOS 3.2 3.1  --   --   --    GFR: Estimated Creatinine Clearance: 138.8 mL/min (by C-G formula based on SCr of 0.67 mg/dL). Liver Function Tests: Recent Labs  Lab 04/28/21 0239 04/28/21 0506  AST 39 35  ALT 23 22  ALKPHOS 147* 150*  BILITOT 3.7* 3.8*  PROT 7.7 6.5  ALBUMIN 2.4* 2.3*   No results for input(s): LIPASE, AMYLASE in the last 168 hours. No results for input(s): AMMONIA in the last 168 hours. Coagulation Profile: Recent Labs  Lab 04/28/21 0506  INR 1.3*   Cardiac Enzymes: Recent Labs  Lab 04/28/21 0239 04/28/21 0506  CKTOTAL 26* 56   BNP (last 3 results) Recent Labs    11/24/20 1343  PROBNP 26.0   HbA1C: No results for input(s): HGBA1C in the last 72 hours. CBG: Recent Labs  Lab 04/30/21 1611 04/30/21 2016 04/30/21 2343 05/01/21 0347 05/01/21 0812  GLUCAP 81 149* 101* 81 86   Lipid Profile: No results for input(s): CHOL, HDL, LDLCALC, TRIG, CHOLHDL,  LDLDIRECT in the last 72 hours. Thyroid Function Tests: No results for input(s): TSH, T4TOTAL, FREET4, T3FREE, THYROIDAB in the last 72 hours. Anemia Panel: No results for input(s): VITAMINB12, FOLATE, FERRITIN, TIBC, IRON, RETICCTPCT in the last 72 hours.    Radiology Studies: I have reviewed all of the imaging during this hospital visit personally     Scheduled Meds:  DULoxetine  20 mg Oral Daily   feeding supplement (GLUCERNA SHAKE)  237 mL Oral TID BM   folic acid  1 mg Oral Daily   gabapentin  100 mg Oral TID   insulin aspart  0-9 Units Subcutaneous Q4H   levothyroxine  50 mcg Oral Q0600   multivitamin with minerals  1 tablet Oral Daily   sodium chloride flush  3 mL Intravenous Q12H   thiamine  100 mg Oral Daily   Continuous Infusions:  sodium chloride     ceFEPime (MAXIPIME) IV 2 g (05/01/21 0025)   vancomycin 1,250 mg (05/01/21 0149)     LOS: 4 days        Bartolo Montanye Annett Gula, MD

## 2021-05-02 DIAGNOSIS — G621 Alcoholic polyneuropathy: Secondary | ICD-10-CM | POA: Diagnosis not present

## 2021-05-02 DIAGNOSIS — F1010 Alcohol abuse, uncomplicated: Secondary | ICD-10-CM | POA: Diagnosis not present

## 2021-05-02 DIAGNOSIS — K7030 Alcoholic cirrhosis of liver without ascites: Secondary | ICD-10-CM | POA: Diagnosis not present

## 2021-05-02 DIAGNOSIS — A419 Sepsis, unspecified organism: Secondary | ICD-10-CM | POA: Diagnosis not present

## 2021-05-02 LAB — GLUCOSE, CAPILLARY
Glucose-Capillary: 88 mg/dL (ref 70–99)
Glucose-Capillary: 99 mg/dL (ref 70–99)
Glucose-Capillary: 82 mg/dL (ref 70–99)
Glucose-Capillary: 84 mg/dL (ref 70–99)
Glucose-Capillary: 131 mg/dL — ABNORMAL HIGH (ref 70–99)

## 2021-05-02 LAB — BASIC METABOLIC PANEL
Anion gap: 8 (ref 5–15)
BUN: 9 mg/dL (ref 6–20)
CO2: 21 mmol/L — ABNORMAL LOW (ref 22–32)
Calcium: 8.5 mg/dL — ABNORMAL LOW (ref 8.9–10.3)
Chloride: 107 mmol/L (ref 98–111)
Creatinine, Ser: 0.63 mg/dL (ref 0.61–1.24)
GFR, Estimated: >60 mL/min (ref >60)
Glucose, Bld: 99 mg/dL (ref 70–99)
Potassium: 3.8 mmol/L (ref 3.5–5.1)
Sodium: 136 mmol/L (ref 135–145)

## 2021-05-02 LAB — CBC WITH DIFFERENTIAL/PLATELET
Abs Immature Granulocytes: 0.06 10*3/uL (ref 0.00–0.07)
Basophils Absolute: 0 10*3/uL (ref 0.0–0.1)
Basophils Relative: 1 %
Eosinophils Absolute: 0 10*3/uL (ref 0.0–0.5)
Eosinophils Relative: 1 %
HCT: 30 % — ABNORMAL LOW (ref 39.0–52.0)
Hemoglobin: 9.9 g/dL — ABNORMAL LOW (ref 13.0–17.0)
Immature Granulocytes: 2 %
Lymphocytes Relative: 24 %
Lymphs Abs: 0.8 10*3/uL (ref 0.7–4.0)
MCH: 32.7 pg (ref 26.0–34.0)
MCHC: 33 g/dL (ref 30.0–36.0)
MCV: 99 fL (ref 80.0–100.0)
Monocytes Absolute: 0.4 10*3/uL (ref 0.1–1.0)
Monocytes Relative: 11 %
Neutro Abs: 2.1 10*3/uL (ref 1.7–7.7)
Neutrophils Relative %: 61 %
Platelets: 173 10*3/uL (ref 150–400)
RBC: 3.03 MIL/uL — ABNORMAL LOW (ref 4.22–5.81)
RDW: 16 % — ABNORMAL HIGH (ref 11.5–15.5)
WBC: 3.5 10*3/uL — ABNORMAL LOW (ref 4.0–10.5)
nRBC: 0 % (ref 0.0–0.2)

## 2021-05-02 LAB — MAGNESIUM: Magnesium: 1.7 mg/dL (ref 1.7–2.4)

## 2021-05-02 MED ORDER — POTASSIUM CHLORIDE CRYS ER 20 MEQ PO TBCR
40.0000 meq | EXTENDED_RELEASE_TABLET | Freq: Once | ORAL | Status: AC
  Administered 2021-05-02: 40 meq via ORAL
  Filled 2021-05-02: qty 2

## 2021-05-02 MED ORDER — MAGNESIUM SULFATE 2 GM/50ML IV SOLN
2.0000 g | Freq: Once | INTRAVENOUS | Status: AC
  Administered 2021-05-02: 2 g via INTRAVENOUS
  Filled 2021-05-02: qty 50

## 2021-05-02 MED ORDER — ENOXAPARIN SODIUM 60 MG/0.6ML IJ SOSY
60.0000 mg | PREFILLED_SYRINGE | INTRAMUSCULAR | Status: DC
  Administered 2021-05-02 – 2021-05-06 (×5): 60 mg via SUBCUTANEOUS
  Filled 2021-05-02 (×5): qty 0.6

## 2021-05-02 NOTE — Plan of Care (Signed)
  Problem: Health Behavior/Discharge Planning: Goal: Ability to manage health-related needs will improve Outcome: Not Progressing   

## 2021-05-02 NOTE — Progress Notes (Signed)
PROGRESS NOTE    [name]  ZOX:[id number] DOB: 09/11/1961 DOA: 04/27/2021 PCP: [name], MD    Brief Narrative:  [name] was admitted to the hospital with the working diagnosis of severe sepsis due to left diabetic foot infection, possible osteomyelitis.    60 year old male past medical history for type 2 diabetes mellitus, hypertension, heart failure, alcohol abuse, obesity and obstructive sleep apnea who presented with left foot infection.  Reported chronic left foot that lately developed worsening edema and expanding erythema.  On the day of hospitalization he was seen at the outpatient clinic and was referred to the hospital for IV antibiotics.  On his initial physical examination his blood pressure was 147/112, heart rate 128, respiratory rate 12, temperature 98.8, oxygen saturation 93%, his lungs were clear to auscultation bilaterally, heart S1 -S2, present, tachycardic, his abdomen was soft nontender, his left leg was edematous, positive distal lymphedema extending up to the mid leg, midfoot plantar deep ulcerated wound.   Sodium 132, potassium 3.0, chloride 92, bicarb 23, glucose 156, BUN 10, creatinine 0.78, lactic acid 4.0, white count 15.9, hemoglobin 11.6, hematocrit 34.0, platelets 154 SARS COVID-19 negative.   Urinalysis specific gravity 1.012, urine sodium less than 10, urine osmolality 379.   Left foot radiograph with considerable soft tissue swelling and plantar diabetic foot ulcer.  Mottled appearance of the base of the fifth metatarsal.   EKG 115 bpm, normal axis, normal intervals, sinus rhythm, no significant ST segment or T wave changes.   Patient placed on broad spectrum antibiotic therapy. Further workup with foot MRI showed large plantar ulceration with sinus tract underlying the 5th metatarsal. Suspected 5 th metatarsal osteomyelitis. Left ankle with signs of septic arthritis with infected tenosynovitis.    Orthopedics recommendation for below the knee  amputation.    I have explained to the patient in detail his condition, severe left foot infection including bone and ankle joint involvement. Risk of worsening infection, blood stream invasion and death. He is not decided yet about surgery.   Patient continue to decline surgical intervention and will prefer IV antibiotics for 4 to 6 weeks before considering amputation.    Assessment & Plan:   Principal Problem:   Severe sepsis (HCC) Active Problems:   Hypertension   Morbid obesity (HCC)   OSA (obstructive sleep apnea)   Hyperlipidemia   Prediabetes   Ulcer of left heel (HCC)   Anemia   Hypothyroidism   Alcoholic cirrhosis of liver without ascites (HCC)   Alcoholic peripheral neuropathy (HCC)   Osteomyelitis (HCC)   Alcohol abuse   Diabetic foot ulcer (HCC)     Left foot diabetic foot infection/ cellulitis/ possible osteomyelitis, complicated with severe sepsis (end-organ damage elevated lactic acid) present on admission. MRI with signs of osteomyelitis of the 5th metatarsal and septic joint of the left ankle.  WBC is 3,5. Blood cultures continue with no growth. Orthopedics has recommended below the knee amputation on the left lower extremity.   Continue with vancomycin and cefepime for the next 48 hrs, if continue to decline surgery and continue to hemodynamically stable will plan to order PICC line in preparation for outpatient antibiotic therapy. Will need ID consultation for antibiotic management as outpatient.   Patient is requesting to contact [name] in [place], for update on patient's condition.     2. Alcohol abuse/ chronic alcoholic cirrhosis. Today with no clinical sings of withdrawal. No tremors or agitation  Required 1 mg of lorazepam po yesterday. Continue CIWA per  protocol.    3. Chronic diastolic congested heart failure/ HTN  Stable with no signs of exacerbation.    4, Hypothyroid On Levothyroxine.   5. Obesity class 2. OSA  Calculated BMI  is 36,9. on Cpap at night.     6. T2DM. Continue with insulin sliding scale for glucose cover and monitoring Diabetic neuropathy, continue with gabapentin.   7. Hypokalemia/ hyponatremia.  Stable renal function with serum cr at 0,63, K is 3,8 and Mg at 1,7 with serum bicarbonate at 21. Add 40 meq Kcl and 2 g Mag sulfate  Follow up renal function in am.    8. Depression.  Stable with no confusion or agitation, continue with duloxetine, trazodone and hydroxyzine.      Patient continue to be at high risk for worsening sepsis   Status is: Inpatient  Remains inpatient appropriate because:IV treatments appropriate due to intensity of illness or inability to take PO and Inpatient level of care appropriate due to severity of illness  Dispo: The patient is from: Home              Anticipated d/c is to: Home              Patient currently is not medically stable to d/c.   Difficult to place patient No   DVT prophylaxis: Enoxaparin   Code Status:   full  Family Communication:  No family at the bedside      Nutrition Status: Nutrition Problem: Increased nutrient needs Etiology: wound healing Signs/Symptoms: estimated needs Interventions: Glucerna shake    Consultants:  Orthopedics    Antimicrobials:  Vancomycin and cefepime     Subjective: Patient with no significant leg pain, no nausea or vomiting, continue to have poor appetite. No tremors or anxiety   Objective: Vitals:   05/01/21 2028 05/01/21 2312 05/02/21 0836 05/02/21 1139  BP: 120/69 105/69 124/69 (!) 144/77  Pulse: 83 80 95   Resp: 18 17 18 16   Temp: 98.6 F (37 C) 98.6 F (37 C) 98.4 F (36.9 C)   TempSrc: Oral Oral Oral Oral  SpO2: 98% 98% 91% 97%  Weight:      Height:        Intake/Output Summary (Last 24 hours) at 05/02/2021 1142 Last data filed at 05/02/2021 0945 Gross per 24 hour  Intake 1253 ml  Output 875 ml  Net 378 ml   Filed Weights   04/27/21 1950  Weight: 127 kg    Examination:    General: Not in pain or dyspnea, deconditioned  Neurology: Awake and alert, non focal  E ENT: no pallor, no icterus, oral mucosa moist Cardiovascular: No JVD. S1-S2 present, rhythmic, no gallops, rubs, or murmurs. Right lower extremity edema, non pitting . Pulmonary: positive breath sounds bilaterally, adequate air movement, no wheezing, rhonchi or rales. Gastrointestinal. Abdomen protuberant, soft and non tender Skin. Dressing in place on right foot, erythema is extending up to the leg but not beyond the knee.  Musculoskeletal: no joint deformities     Data Reviewed: I have personally reviewed following labs and imaging studies  CBC: Recent Labs  Lab 04/28/21 0506 04/29/21 0630 04/30/21 0440 05/01/21 0119 05/02/21 0125  WBC 12.1* 6.6 5.7 3.9* 3.5*  NEUTROABS 9.8* 5.2 4.2 2.7 2.1  HGB 10.6* 9.9* 9.8* 10.0* 9.9*  HCT 31.7* 28.8* 28.9* 29.6* 30.0*  MCV 95.8 94.4 96.7 96.7 99.0  PLT 147* 142* 140* 158 173   Basic Metabolic Panel: Recent Labs  Lab 04/28/21 0239 04/28/21 0506  04/29/21 0149 04/30/21 0440 05/01/21 0119 05/02/21 0125  NA 132* 132* 131* 132* 134* 136  K 3.3* 3.3* 3.7 3.0* 3.3* 3.8  CL 93* 95* 99 99 104 107  CO2 24 23 21* 25 25 21*  GLUCOSE 135* 127* 96 87 84 99  BUN 10 10 9 8 7 9   CREATININE 0.73 0.71 0.75 0.77 0.67 0.63  CALCIUM 8.2* 7.8* 7.7* 8.2* 8.5* 8.5*  MG 1.2* 1.3* 1.9  --   --  1.7  PHOS 3.2 3.1  --   --   --   --    GFR: Estimated Creatinine Clearance: 138.8 mL/min (by C-G formula based on SCr of 0.63 mg/dL). Liver Function Tests: Recent Labs  Lab 04/28/21 0239 04/28/21 0506  AST 39 35  ALT 23 22  ALKPHOS 147* 150*  BILITOT 3.7* 3.8*  PROT 7.7 6.5  ALBUMIN 2.4* 2.3*   No results for input(s): LIPASE, AMYLASE in the last 168 hours. No results for input(s): AMMONIA in the last 168 hours. Coagulation Profile: Recent Labs  Lab 04/28/21 0506  INR 1.3*   Cardiac Enzymes: Recent Labs  Lab 04/28/21 0239 04/28/21 0506  CKTOTAL  26* 56   BNP (last 3 results) Recent Labs    11/24/20 1343  PROBNP 26.0   HbA1C: No results for input(s): HGBA1C in the last 72 hours. CBG: Recent Labs  Lab 05/01/21 1612 05/01/21 2027 05/01/21 2315 05/02/21 0403 05/02/21 0834  GLUCAP 109* 85 94 88 99   Lipid Profile: No results for input(s): CHOL, HDL, LDLCALC, TRIG, CHOLHDL, LDLDIRECT in the last 72 hours. Thyroid Function Tests: No results for input(s): TSH, T4TOTAL, FREET4, T3FREE, THYROIDAB in the last 72 hours. Anemia Panel: No results for input(s): VITAMINB12, FOLATE, FERRITIN, TIBC, IRON, RETICCTPCT in the last 72 hours.    Radiology Studies: I have reviewed all of the imaging during this hospital visit personally     Scheduled Meds:  DULoxetine  20 mg Oral Daily   enoxaparin (LOVENOX) injection  60 mg Subcutaneous Q24H   feeding supplement (GLUCERNA SHAKE)  237 mL Oral TID BM   folic acid  1 mg Oral Daily   gabapentin  100 mg Oral TID   insulin aspart  0-9 Units Subcutaneous Q4H   levothyroxine  50 mcg Oral Q0600   multivitamin with minerals  1 tablet Oral Daily   sodium chloride flush  3 mL Intravenous Q12H   thiamine  100 mg Oral Daily   Continuous Infusions:  sodium chloride     ceFEPime (MAXIPIME) IV 2 g (05/02/21 05/04/21)   vancomycin 1,250 mg (05/02/21 0224)     LOS: 5 days        [name] 05/04/21, MD

## 2021-05-03 DIAGNOSIS — G621 Alcoholic polyneuropathy: Secondary | ICD-10-CM | POA: Diagnosis not present

## 2021-05-03 DIAGNOSIS — D649 Anemia, unspecified: Secondary | ICD-10-CM | POA: Diagnosis not present

## 2021-05-03 DIAGNOSIS — A419 Sepsis, unspecified organism: Secondary | ICD-10-CM | POA: Diagnosis not present

## 2021-05-03 DIAGNOSIS — E11621 Type 2 diabetes mellitus with foot ulcer: Secondary | ICD-10-CM | POA: Diagnosis not present

## 2021-05-03 LAB — CBC WITH DIFFERENTIAL/PLATELET
Abs Immature Granulocytes: 0.18 10*3/uL — ABNORMAL HIGH (ref 0.00–0.07)
Basophils Absolute: 0 10*3/uL (ref 0.0–0.1)
Basophils Relative: 1 %
Eosinophils Absolute: 0.1 10*3/uL (ref 0.0–0.5)
Eosinophils Relative: 1 %
HCT: 32.9 % — ABNORMAL LOW (ref 39.0–52.0)
Hemoglobin: 10.9 g/dL — ABNORMAL LOW (ref 13.0–17.0)
Immature Granulocytes: 5 %
Lymphocytes Relative: 27 %
Lymphs Abs: 1 10*3/uL (ref 0.7–4.0)
MCH: 32.2 pg (ref 26.0–34.0)
MCHC: 33.1 g/dL (ref 30.0–36.0)
MCV: 97.1 fL (ref 80.0–100.0)
Monocytes Absolute: 0.5 10*3/uL (ref 0.1–1.0)
Monocytes Relative: 13 %
Neutro Abs: 1.9 10*3/uL (ref 1.7–7.7)
Neutrophils Relative %: 53 %
Platelets: 243 10*3/uL (ref 150–400)
RBC: 3.39 MIL/uL — ABNORMAL LOW (ref 4.22–5.81)
RDW: 15.9 % — ABNORMAL HIGH (ref 11.5–15.5)
WBC: 3.6 10*3/uL — ABNORMAL LOW (ref 4.0–10.5)
nRBC: 0 % (ref 0.0–0.2)

## 2021-05-03 LAB — BASIC METABOLIC PANEL
Anion gap: 7 (ref 5–15)
BUN: 8 mg/dL (ref 6–20)
CO2: 23 mmol/L (ref 22–32)
Calcium: 8.7 mg/dL — ABNORMAL LOW (ref 8.9–10.3)
Chloride: 104 mmol/L (ref 98–111)
Creatinine, Ser: 0.69 mg/dL (ref 0.61–1.24)
GFR, Estimated: >60 mL/min (ref >60)
Glucose, Bld: 88 mg/dL (ref 70–99)
Potassium: 3.8 mmol/L (ref 3.5–5.1)
Sodium: 134 mmol/L — ABNORMAL LOW (ref 135–145)

## 2021-05-03 LAB — GLUCOSE, CAPILLARY
Glucose-Capillary: 100 mg/dL — ABNORMAL HIGH (ref 70–99)
Glucose-Capillary: 101 mg/dL — ABNORMAL HIGH (ref 70–99)
Glucose-Capillary: 104 mg/dL — ABNORMAL HIGH (ref 70–99)
Glucose-Capillary: 107 mg/dL — ABNORMAL HIGH (ref 70–99)
Glucose-Capillary: 85 mg/dL (ref 70–99)
Glucose-Capillary: 88 mg/dL (ref 70–99)
Glucose-Capillary: 96 mg/dL (ref 70–99)

## 2021-05-03 LAB — CULTURE, BLOOD (ROUTINE X 2)
Culture: NO GROWTH
Culture: NO GROWTH
Special Requests: ADEQUATE

## 2021-05-03 LAB — VANCOMYCIN, TROUGH: Vancomycin Tr: 14 ug/mL — ABNORMAL LOW (ref 15–20)

## 2021-05-03 LAB — VANCOMYCIN, PEAK: Vancomycin Pk: 34 ug/mL (ref 30–40)

## 2021-05-03 MED ORDER — SENNOSIDES-DOCUSATE SODIUM 8.6-50 MG PO TABS
1.0000 | ORAL_TABLET | Freq: Two times a day (BID) | ORAL | Status: DC
  Administered 2021-05-03 – 2021-05-05 (×2): 1 via ORAL
  Filled 2021-05-03 (×5): qty 1

## 2021-05-03 MED ORDER — DOCUSATE SODIUM 100 MG PO CAPS: 100.0000 mg | ORAL_CAPSULE | Freq: Every day | ORAL | Status: DC

## 2021-05-03 MED ORDER — LORAZEPAM 1 MG PO TABS
1.0000 mg | ORAL_TABLET | ORAL | Status: DC | PRN
  Administered 2021-05-06: 1 mg via ORAL
  Filled 2021-05-03: qty 1

## 2021-05-03 NOTE — Progress Notes (Signed)
PROGRESS NOTE    [name]  FTD:[id number] DOB: February 04, 1961 DOA: 04/27/2021 PCP: [name] Sanes, MD    Brief Narrative:  [name] was admitted to the hospital with the working diagnosis of severe sepsis due to left diabetic foot infection, possible osteomyelitis.    60 year old male past medical history for type 2 diabetes mellitus, hypertension, heart failure, alcohol abuse, obesity and obstructive sleep apnea who presented with left foot infection.  Reported chronic left foot that lately developed worsening edema and expanding erythema.  On the day of hospitalization he was seen at the outpatient clinic and was referred to the hospital for IV antibiotics.  On his initial physical examination his blood pressure was 147/112, heart rate 128, respiratory rate 12, temperature 98.8, oxygen saturation 93%, his lungs were clear to auscultation bilaterally, heart S1 -S2, present, tachycardic, his abdomen was soft nontender, his left leg was edematous, positive distal lymphedema extending up to the mid leg, midfoot plantar deep ulcerated wound.   Sodium 132, potassium 3.0, chloride 92, bicarb 23, glucose 156, BUN 10, creatinine 0.78, lactic acid 4.0, white count 15.9, hemoglobin 11.6, hematocrit 34.0, platelets 154 SARS COVID-19 negative.   Urinalysis specific gravity 1.012, urine sodium less than 10, urine osmolality 379.   Left foot radiograph with considerable soft tissue swelling and plantar diabetic foot ulcer.  Mottled appearance of the base of the fifth metatarsal.   EKG 115 bpm, normal axis, normal intervals, sinus rhythm, no significant ST segment or T wave changes.   Patient placed on broad spectrum antibiotic therapy. Further workup with foot MRI showed large plantar ulceration with sinus tract underlying the 5th metatarsal. Suspected 5 th metatarsal osteomyelitis. Left ankle with signs of septic arthritis with infected tenosynovitis.    Orthopedics recommendation for below the knee  amputation.    I have explained to the patient in detail his condition, severe left foot infection including bone and ankle joint involvement. Risk of worsening infection, blood stream invasion and death. He is not decided yet about surgery.    Patient continue to decline surgical intervention and will prefer IV antibiotics for 4 to 6 weeks before considering amputation.      Assessment & Plan:   Principal Problem:   Severe sepsis (HCC) Active Problems:   Hypertension   Morbid obesity (HCC)   OSA (obstructive sleep apnea)   Hyperlipidemia   Prediabetes   Ulcer of left heel (HCC)   Anemia   Hypothyroidism   Alcoholic cirrhosis of liver without ascites (HCC)   Alcoholic peripheral neuropathy (HCC)   Osteomyelitis (HCC)   Alcohol abuse   Diabetic foot ulcer (HCC)    Left foot diabetic foot infection/ cellulitis/ possible osteomyelitis, complicated with severe sepsis (end-organ damage elevated lactic acid) present on admission. MRI with signs of osteomyelitis of the 5th metatarsal and septic joint of the left ankle.  Orthopedics has recommended below the knee amputation on the left lower extremity.  Wbc is 3,6, continue to be afebrile,   Antibiotic therapy with vancomycin and cefepime. Plan to place PICC line tomorrow and consult social services for SNF placement to complete antibiotic therapy. Will consult ID tomorrow for antibiotic follow up.   Follow with orthopedic recommendations.      2. Alcohol abuse/ chronic alcoholic cirrhosis.  no signs of alcohol withdrawal. He has been calm.    Plan to transition to as needed oral lorazepam for anxiety control.    3. Chronic diastolic congested heart failure/ HTN  No signs of exacerbation.  4, Hypothyroid On Levothyroxine.   5. Obesity class 2. OSA  Calculated BMI is 36,9. on Cpap at night.   6. T2DM. fasting glucose this am was 88. Continue wiith insulin sliding scale for glucose cover and monitoring For neuropathy,  continue with gabapentin.   7. Hypokalemia/ hyponatremia.  Electrolytes have been corrected, patient tolerating po well, with no nausea or vomiting. Continue to follow renal function during antibiotic therapy.    8. Depression.  No confusion or agitation, continue with duloxetine, trazodone and hydroxyzine.      Status is: Inpatient  Remains inpatient appropriate because:IV treatments appropriate due to intensity of illness or inability to take PO  Dispo: The patient is from: Home              Anticipated d/c is to: SNF              Patient currently is not medically stable to d/c.   Difficult to place patient No   DVT prophylaxis: Enoxaparin   Code Status:   full  Family Communication:  I spoke with patient's girlfriend at the bedside, we talked in detail about patient's condition, plan of care and prognosis and all questions were addressed.      Nutrition Status: Nutrition Problem: Increased nutrient needs Etiology: wound healing Signs/Symptoms: estimated needs Interventions: Glucerna shake     Skin Documentation:     Consultants:  Orthopedics    Antimicrobials:  Vancomycin and cefepime     Subjective: Patient continue to have edema on his left lower extremity, no nausea or vomiting, no chest pain or dyspnea, improved appetite, no tremors or anxiety   Objective: Vitals:   05/02/21 2340 05/03/21 0406 05/03/21 0700 05/03/21 1141  BP: 139/78 (!) 152/83 (!) 148/70 (!) 156/85  Pulse: 97 94 (!) 101 94  Resp: 17 18 20 18   Temp: 97.6 F (36.4 C) 98.3 F (36.8 C) 99 F (37.2 C) 98.3 F (36.8 C)  TempSrc: Oral Oral Oral Oral  SpO2: 98% 97% 90% 93%  Weight:      Height:        Intake/Output Summary (Last 24 hours) at 05/03/2021 1523 Last data filed at 05/03/2021 1142 Gross per 24 hour  Intake 673.03 ml  Output 2450 ml  Net -1776.97 ml   Filed Weights   04/27/21 1950  Weight: 127 kg    Examination:   General: Not in pain or dyspnea, deconditioned   Neurology: Awake and alert, non focal  E ENT: mild pallor, no icterus, oral mucosa moist Cardiovascular: No JVD. S1-S2 present, rhythmic, no gallops, rubs, or murmurs. Left lower extremity edema, non pitting  Pulmonary: positive breath sounds bilaterally, adequate air movement, no wheezing, rhonchi or rales. Gastrointestinal. Abdomen protuberant, soft and non tender Skin. Left foot with dressing in place with positive erythema.  Musculoskeletal: no joint deformities     Data Reviewed: I have personally reviewed following labs and imaging studies  CBC: Recent Labs  Lab 04/29/21 0630 04/30/21 0440 05/01/21 0119 05/02/21 0125 05/03/21 0509  WBC 6.6 5.7 3.9* 3.5* 3.6*  NEUTROABS 5.2 4.2 2.7 2.1 1.9  HGB 9.9* 9.8* 10.0* 9.9* 10.9*  HCT 28.8* 28.9* 29.6* 30.0* 32.9*  MCV 94.4 96.7 96.7 99.0 97.1  PLT 142* 140* 158 173 243   Basic Metabolic Panel: Recent Labs  Lab 04/28/21 0239 04/28/21 0506 04/29/21 0149 04/30/21 0440 05/01/21 0119 05/02/21 0125 05/03/21 0509  NA 132* 132* 131* 132* 134* 136 134*  K 3.3* 3.3* 3.7 3.0* 3.3* 3.8  3.8  CL 93* 95* 99 99 104 107 104  CO2 24 23 21* 25 25 21* 23  GLUCOSE 135* 127* 96 87 84 99 88  BUN 10 10 9 8 7 9 8   CREATININE 0.73 0.71 0.75 0.77 0.67 0.63 0.69  CALCIUM 8.2* 7.8* 7.7* 8.2* 8.5* 8.5* 8.7*  MG 1.2* 1.3* 1.9  --   --  1.7  --   PHOS 3.2 3.1  --   --   --   --   --    GFR: Estimated Creatinine Clearance: 138.8 mL/min (by C-G formula based on SCr of 0.69 mg/dL). Liver Function Tests: Recent Labs  Lab 04/28/21 0239 04/28/21 0506  AST 39 35  ALT 23 22  ALKPHOS 147* 150*  BILITOT 3.7* 3.8*  PROT 7.7 6.5  ALBUMIN 2.4* 2.3*   No results for input(s): LIPASE, AMYLASE in the last 168 hours. No results for input(s): AMMONIA in the last 168 hours. Coagulation Profile: Recent Labs  Lab 04/28/21 0506  INR 1.3*   Cardiac Enzymes: Recent Labs  Lab 04/28/21 0239 04/28/21 0506  CKTOTAL 26* 56   BNP (last 3  results) Recent Labs    11/24/20 1343  PROBNP 26.0   HbA1C: No results for input(s): HGBA1C in the last 72 hours. CBG: Recent Labs  Lab 05/02/21 2003 05/03/21 0007 05/03/21 0408 05/03/21 0734 05/03/21 1139  GLUCAP 131* 104* 85 100* 101*   Lipid Profile: No results for input(s): CHOL, HDL, LDLCALC, TRIG, CHOLHDL, LDLDIRECT in the last 72 hours. Thyroid Function Tests: No results for input(s): TSH, T4TOTAL, FREET4, T3FREE, THYROIDAB in the last 72 hours. Anemia Panel: No results for input(s): VITAMINB12, FOLATE, FERRITIN, TIBC, IRON, RETICCTPCT in the last 72 hours.    Radiology Studies: I have reviewed all of the imaging during this hospital visit personally     Scheduled Meds:  DULoxetine  20 mg Oral Daily   enoxaparin (LOVENOX) injection  60 mg Subcutaneous Q24H   feeding supplement (GLUCERNA SHAKE)  237 mL Oral TID BM   folic acid  1 mg Oral Daily   gabapentin  100 mg Oral TID   insulin aspart  0-9 Units Subcutaneous Q4H   levothyroxine  50 mcg Oral Q0600   multivitamin with minerals  1 tablet Oral Daily   sodium chloride flush  3 mL Intravenous Q12H   thiamine  100 mg Oral Daily   Continuous Infusions:  sodium chloride     ceFEPime (MAXIPIME) IV 2 g (05/03/21 0802)   vancomycin 1,250 mg (05/03/21 1429)     LOS: 6 days        Doy Taaffe 05/05/21, MD

## 2021-05-03 NOTE — Consult Note (Signed)
WOC Nurse Consult Note: Reason for Consult: Consulted for topical care guidance for left plantar foot wound. Patient has been seen in consultation with Orthopedics (Dr. Victorino Dike) and he is recommending amputation. Currently that procedure is planned for early this week, perhaps Tuesday, 7/19. Wound type: chronic, nonhealing neuropathic, full thickness Pressure Injury POA: N/A Wound bed: Beefy red, moist Drainage (amount, consistency, odor) small to moderate serous to light yellow Periwound: intact, dry, edematous, erythematous Dressing procedure/placement/frequency: Given the recommendation for surgery, I will proceed with conservative care orders for Nursing to minimize the risk of infection with foot as a portal of entry. Twice daily wound care using normal saline moistened gauze and topped with a dry dressing and secured with Kerlix roll gauze and paper tape are indicated.   WOC nursing team will not follow, but will remain available to this patient, the nursing and medical teams.  Please re-consult if needed. Thanks, Ladona Mow, MSN, RN, GNP, [facility]  Pager# [phone]

## 2021-05-03 NOTE — Progress Notes (Signed)
Pharmacy Antibiotic Note  [name] is a 60 y.o. male admitted on 04/27/2021 with left swollen foot.  Continues on Vancomycin  and Cefepime for diabetic foot infection/possible osteomyelitis.   Vancomycin levels drawn today - > VP 34 and VT 14 to give AUC of 603. Renal function stable, given osteo/septic joint, will continue at upper end of AUC since pt wanting OPAT vs. amputation. Recheck lvl later this week if renal function changes.  Plan: Continue Vancomycin 1250 mg IV Q 12 hours Cefepime 2gm IV q8h  Follow renal function, clinical course, and vanc levels as appropriate.   Height: 6\' 1"  (185.4 cm) Weight: 127 kg (280 lb) IBW/kg (Calculated) : 79.9  Temp (24hrs), Avg:98.3 F (36.8 C), Min:97.6 F (36.4 C), Max:99 F (37.2 C)  Recent Labs  Lab 04/27/21 2253 04/28/21 0234 04/28/21 0239 04/28/21 0506 04/28/21 1809 04/29/21 0149 04/29/21 0630 04/29/21 0757 04/30/21 0440 04/30/21 1227 05/01/21 0119 05/02/21 0125 05/03/21 0509 05/03/21 1324  WBC  --   --   --  12.1*  --   --  6.6  --  5.7  --  3.9* 3.5* 3.6*  --   CREATININE  --   --    < > 0.71  --  0.75  --   --  0.77  --  0.67 0.63 0.69  --   LATICACIDVEN 4.0* 3.6*  --  3.6* 2.3*  --   --  1.2  --   --   --   --   --   --   VANCOTROUGH  --   --   --   --   --   --   --   --   --  17  --   --   --  14*  VANCOPEAK  --   --   --   --   --   --   --   --  35  --   --   --  34  --    < > = values in this interval not displayed.     Estimated Creatinine Clearance: 138.8 mL/min (by C-G formula based on SCr of 0.69 mg/dL).    05/05/21, Pharm.D. PGY-1 Ambulatory Care Resident [phone] 05/03/2021 2:31 PM

## 2021-05-03 NOTE — Plan of Care (Signed)
  Problem: Health Behavior/Discharge Planning: Goal: Ability to manage health-related needs will improve Outcome: Not Progressing   

## 2021-05-04 ENCOUNTER — Inpatient Hospital Stay: Payer: Self-pay

## 2021-05-04 DIAGNOSIS — M869 Osteomyelitis, unspecified: Secondary | ICD-10-CM

## 2021-05-04 DIAGNOSIS — D649 Anemia, unspecified: Secondary | ICD-10-CM | POA: Diagnosis not present

## 2021-05-04 DIAGNOSIS — K7030 Alcoholic cirrhosis of liver without ascites: Secondary | ICD-10-CM

## 2021-05-04 DIAGNOSIS — E11621 Type 2 diabetes mellitus with foot ulcer: Secondary | ICD-10-CM | POA: Diagnosis not present

## 2021-05-04 DIAGNOSIS — G4733 Obstructive sleep apnea (adult) (pediatric): Secondary | ICD-10-CM

## 2021-05-04 DIAGNOSIS — G621 Alcoholic polyneuropathy: Secondary | ICD-10-CM

## 2021-05-04 DIAGNOSIS — L97426 Non-pressure chronic ulcer of left heel and midfoot with bone involvement without evidence of necrosis: Secondary | ICD-10-CM

## 2021-05-04 DIAGNOSIS — A419 Sepsis, unspecified organism: Principal | ICD-10-CM

## 2021-05-04 DIAGNOSIS — F1010 Alcohol abuse, uncomplicated: Secondary | ICD-10-CM | POA: Diagnosis not present

## 2021-05-04 DIAGNOSIS — R6520 Severe sepsis without septic shock: Secondary | ICD-10-CM

## 2021-05-04 DIAGNOSIS — L97429 Non-pressure chronic ulcer of left heel and midfoot with unspecified severity: Secondary | ICD-10-CM

## 2021-05-04 LAB — BASIC METABOLIC PANEL
Anion gap: 9 (ref 5–15)
BUN: 5 mg/dL — ABNORMAL LOW (ref 6–20)
CO2: 23 mmol/L (ref 22–32)
Calcium: 9.2 mg/dL (ref 8.9–10.3)
Chloride: 103 mmol/L (ref 98–111)
Creatinine, Ser: 0.73 mg/dL (ref 0.61–1.24)
GFR, Estimated: >60 mL/min (ref >60)
Glucose, Bld: 81 mg/dL (ref 70–99)
Potassium: 3.9 mmol/L (ref 3.5–5.1)
Sodium: 135 mmol/L (ref 135–145)

## 2021-05-04 LAB — CBC WITH DIFFERENTIAL/PLATELET
Abs Immature Granulocytes: 0.23 10*3/uL — ABNORMAL HIGH (ref 0.00–0.07)
Basophils Absolute: 0.1 10*3/uL (ref 0.0–0.1)
Basophils Relative: 1 %
Eosinophils Absolute: 0 10*3/uL (ref 0.0–0.5)
Eosinophils Relative: 1 %
HCT: 32.3 % — ABNORMAL LOW (ref 39.0–52.0)
Hemoglobin: 10.5 g/dL — ABNORMAL LOW (ref 13.0–17.0)
Immature Granulocytes: 5 %
Lymphocytes Relative: 25 %
Lymphs Abs: 1.1 10*3/uL (ref 0.7–4.0)
MCH: 31.8 pg (ref 26.0–34.0)
MCHC: 32.5 g/dL (ref 30.0–36.0)
MCV: 97.9 fL (ref 80.0–100.0)
Monocytes Absolute: 0.5 10*3/uL (ref 0.1–1.0)
Monocytes Relative: 11 %
Neutro Abs: 2.5 10*3/uL (ref 1.7–7.7)
Neutrophils Relative %: 57 %
Platelets: 264 10*3/uL (ref 150–400)
RBC: 3.3 MIL/uL — ABNORMAL LOW (ref 4.22–5.81)
RDW: 16 % — ABNORMAL HIGH (ref 11.5–15.5)
WBC: 4.4 10*3/uL (ref 4.0–10.5)
nRBC: 0 % (ref 0.0–0.2)

## 2021-05-04 LAB — GLUCOSE, CAPILLARY
Glucose-Capillary: 122 mg/dL — ABNORMAL HIGH (ref 70–99)
Glucose-Capillary: 124 mg/dL — ABNORMAL HIGH (ref 70–99)
Glucose-Capillary: 79 mg/dL (ref 70–99)
Glucose-Capillary: 79 mg/dL (ref 70–99)
Glucose-Capillary: 83 mg/dL (ref 70–99)
Glucose-Capillary: 90 mg/dL (ref 70–99)

## 2021-05-04 MED ORDER — SODIUM CHLORIDE 0.9 % IV SOLN
2.0000 g | INTRAVENOUS | Status: DC
  Filled 2021-05-04: qty 20

## 2021-05-04 MED ORDER — SODIUM CHLORIDE 0.9 % IV SOLN
1.0000 g | INTRAVENOUS | Status: DC
  Administered 2021-05-04 – 2021-05-05 (×2): 1000 mg via INTRAVENOUS
  Filled 2021-05-04 (×3): qty 1

## 2021-05-04 MED ORDER — METRONIDAZOLE 500 MG PO TABS: 500.0000 mg | ORAL_TABLET | Freq: Two times a day (BID) | ORAL | Status: DC

## 2021-05-04 MED ORDER — CHLORHEXIDINE GLUCONATE CLOTH 2 % EX PADS
6.0000 | MEDICATED_PAD | Freq: Every day | CUTANEOUS | Status: DC
  Administered 2021-05-04 – 2021-05-06 (×3): 6 via TOPICAL

## 2021-05-04 MED ORDER — SODIUM CHLORIDE 0.9% FLUSH: 10.0000 mL | INTRAVENOUS | Status: DC | PRN

## 2021-05-04 MED ORDER — SODIUM CHLORIDE 0.9% FLUSH
10.0000 mL | Freq: Two times a day (BID) | INTRAVENOUS | Status: DC
  Administered 2021-05-04 – 2021-05-05 (×2): 10 mL

## 2021-05-04 NOTE — Progress Notes (Signed)
Physical Therapy Treatment Patient Details Name: [name] MRN: [id number] DOB: 1960-12-30 Today's Date: 05/04/2021    History of Present Illness Gilmer Kaminsky is a 60 y.o. male admitted for working diagnosis of severe sepsis due to L diabetic foot infection/ulcer. MRI shows possible early osteomyelitis, septic joint arthritis with infected tenosynovitis. Awaiting further orthopedic recommendations. PMH: HTN, obesity, HLD, prediabetes, alcoholic cirrhosis, alcohol abuse.    PT Comments    PTA called back into room to assist RN with getting pt back to bed, pt girlfriend present also for caregiver instruction on transfer training techniques. Pt attempted standing transfer with +2 assist but unable to reach full upright posture without TTWB on LLE, so deferred stand pivot for safety due to LLE NWB noncompliance. Pt able to perform slide board transfer from chair>bed with minA to maintain NWB status. Pt moderately fatigued (5/10 modified RPE) after mobility tasks. Pt continues to benefit from PT services to progress toward functional mobility goals. Continue to recommend SNF.   Follow Up Recommendations  SNF;Supervision for mobility/OOB;Other (comment) (pt now interested in SNF, if pt instead chooses home then HHPT and max services including aide and HHRN if possible)     Equipment Recommendations  Wheelchair (measurements PT);Wheelchair cushion (measurements PT);Other (comment) (slide board, ramp (needs to be installed, pt has 3 STE))    Recommendations for Other Services       Precautions / Restrictions Precautions Precautions: Fall Precaution Comments: L heel non-healing ulcer with drainage Restrictions Weight Bearing Restrictions: Yes LLE Weight Bearing: Non weight bearing    Mobility  Bed Mobility Overal bed mobility: Modified Independent             General bed mobility comments: Flat HOB, no rail, increased time/effort    Transfers Overall transfer level: Needs  assistance Equipment used: Rolling walker (2 wheeled);Sliding board Transfers: Sit to/from [employer];Lateral/Scoot Transfers Sit to Stand: Mod assist;+2 physical assistance;From elevated surface Stand pivot transfers: Mod assist;+2 safety/equipment      Lateral/Scoot Transfers: With slide board;Min assist General transfer comment: STS from chair with RW, pt unable to maintain NWB on LLE while pivoting so returned to chair and used slide board to scoot back to bed with minA and assist only for SB placement and to keep LLE elevated for NWB  Ambulation/Gait                 Stairs             Wheelchair Mobility    Modified Rankin (Stroke Patients Only)       Balance Overall balance assessment: Needs assistance Sitting-balance support: No upper extremity supported;Feet supported Sitting balance-Leahy Scale: Good Sitting balance - Comments: static sitting and weight shifting without LOB for SB placement   Standing balance support: Single extremity supported;During functional activity Standing balance-Leahy Scale: Poor Standing balance comment: BUE support and noncompliance with NWB on LLE despite +2 assist                            Cognition Arousal/Alertness: Awake/alert Behavior During Therapy: WFL for tasks assessed/performed Overall Cognitive Status: No family/caregiver present to determine baseline cognitive functioning                                 General Comments: WFL cognitively for basic tasks/conversation, no evidence of hallucinating though variable awareness of functional abilities.  Exercises      General Comments General comments (skin integrity, edema, etc.): VSS on RA      Pertinent Vitals/Pain Pain Assessment: Faces Faces Pain Scale: Hurts a little bit Pain Location: L hip, sciatic nerve pain per pt Pain Descriptors / Indicators: Sore Pain Intervention(s): Monitored during  session;Repositioned    Home Living                      Prior Function            PT Goals (current goals can now be found in the care plan section) Acute Rehab PT Goals Patient Stated Goal: avoid surgery, for L foot to heal PT Goal Formulation: With patient Time For Goal Achievement: 05/14/21 Progress towards PT goals: Progressing toward goals    Frequency    Min 3X/week      PT Plan Current plan remains appropriate    Co-evaluation              AM-PAC PT "6 Clicks" Mobility   Outcome Measure  Help needed turning from your back to your side while in a flat bed without using bedrails?: None Help needed moving from lying on your back to sitting on the side of a flat bed without using bedrails?: None Help needed moving to and from a bed to a chair (including a wheelchair)?: A Little Help needed standing up from a chair using your arms (e.g., wheelchair or bedside chair)?: A Lot Help needed to walk in hospital room?: Total Help needed climbing 3-5 steps with a railing? : Total 6 Click Score: 15    End of Session Equipment Utilized During Treatment: Gait belt Activity Tolerance: Patient tolerated treatment well Patient left: in bed;with call bell/phone within reach;with bed alarm set;with family/visitor present Nurse Communication: Mobility status;Weight bearing status;Other (comment) (use slide board, pt wants to speak with case mgr about SNF vs HHPT) PT Visit Diagnosis: Other abnormalities of gait and mobility (R26.89);Muscle weakness (generalized) (M62.81)     Time: 1736-1750 PT Time Calculation (min) (ACUTE ONLY): 14 min  Charges:  $Therapeutic Activity: 8-22 mins                     Cedar Ditullio P., PTA Acute Rehabilitation Services Pager: [phone] Office: [phone]    [name] Palms 05/04/2021, 6:48 PM

## 2021-05-04 NOTE — Progress Notes (Signed)
MD: can this patient be switched to [facility] instead of q4?  Thank you, nursing

## 2021-05-04 NOTE — Progress Notes (Signed)
Occupational Therapy Treatment Patient Details Name: [name] MRN: [id number] DOB: 28-Aug-1961 Today's Date: 05/04/2021    History of present illness [name] is a 60 y.o. male admitted for working diagnosis of severe sepsis due to L diabetic foot infection/ulcer. MRI shows possible early osteomyelitis, septic joint arthritis with infected tenosynovitis. Awaiting further orthopedic recommendations. PMH: HTN, obesity, HLD, prediabetes, alcoholic cirrhosis, alcohol abuse.   OT comments  Pt seen for additional functional abilities assessment and DC planning assist. Pt reports decreased caregiver support at home, unsure how to manage ADLs/mobility if NWB L LE recommended, and now agreeable for SNF rehab. Collaborated with pt on DME needs for NWB status, home setup for ADLs and strategies for transfers. On eval, pt noncompliant with NWB L LE and reports currently self TDWB to L LE. However, pt declined any EOB/OOB attempts with OT this afternoon citing sciatic nerve pain (discussed with RN). Though on OT exit, pt requesting bedrail be put down to sit EOB for urinal use. Will update recs to SNF rehab and progress OOB activities within pt tolerance.   Follow Up Recommendations  SNF;Supervision - Intermittent    Equipment Recommendations  3 in 1 bedside commode;Wheelchair (measurements OT);Wheelchair cushion (measurements OT);Other (comment) (Rolling walker)    Recommendations for Other Services      Precautions / Restrictions Precautions Precautions: Fall Restrictions Weight Bearing Restrictions: Yes LLE Weight Bearing: Non weight bearing       Mobility Bed Mobility               General bed mobility comments: declined    Transfers                 General transfer comment: pt declined    Balance                                           ADL either performed or assessed with clinical judgement   ADL Overall ADL's : Needs assistance/impaired                                        General ADL Comments: Pt declined any OOB attempts with therapist but requested bedrail be put down so pt could sit EOB for urinal use after OT exit. Discussed home setup, functional abilities if pt serious about following NWB L LE recommendations, DME needs.     Vision   Vision Assessment?: No apparent visual deficits   Perception     Praxis      Cognition Arousal/Alertness: Awake/alert Behavior During Therapy: WFL for tasks assessed/performed Overall Cognitive Status: No family/caregiver present to determine baseline cognitive functioning                                 General Comments: WFL cognitively for basic tasks/conversation, no evidence of hallucinating though variable awareness of functional abilities and participation needs for therapy        Exercises     Shoulder Instructions       General Comments      Pertinent Vitals/ Pain       Pain Assessment: Faces Faces Pain Scale: Hurts a little bit Pain Location: L hip, sciatic nerve pain per pt Pain Descriptors / Indicators: Sore Pain  Intervention(s): Monitored during session;Patient requesting pain meds-RN notified  Home Living                                          Prior Functioning/Environment              Frequency  Min 2X/week        Progress Toward Goals  OT Goals(current goals can now be found in the care plan section)  Progress towards OT goals: OT to reassess next treatment  Acute Rehab OT Goals Patient Stated Goal: avoid surgery, for L foot to heal OT Goal Formulation: With patient Time For Goal Achievement: 05/14/21 Potential to Achieve Goals: Good ADL Goals Pt Will Perform Lower Body Bathing: sitting/lateral leans;sit to/from stand;with set-up Pt Will Perform Lower Body Dressing: with set-up;sitting/lateral leans;sit to/from stand Pt Will Transfer to Toilet: with set-up;stand pivot  transfer;squat pivot transfer;bedside commode  Plan Discharge plan needs to be updated    Co-evaluation                 AM-PAC OT "6 Clicks" Daily Activity     Outcome Measure   Help from another person eating meals?: None Help from another person taking care of personal grooming?: None Help from another person toileting, which includes using toliet, bedpan, or urinal?: A Lot Help from another person bathing (including washing, rinsing, drying)?: A Little Help from another person to put on and taking off regular upper body clothing?: None Help from another person to put on and taking off regular lower body clothing?: A Lot 6 Click Score: 19    End of Session    OT Visit Diagnosis: Other abnormalities of gait and mobility (R26.89);Pain Pain - Right/Left: Left Pain - part of body: Hip   Activity Tolerance Other (comment);Patient limited by pain (self limiting)   Patient Left in bed;with call bell/phone within reach   Nurse Communication Mobility status;Patient requests pain meds;Other (comment) (DC plans)        Time: 0737-1062 OT Time Calculation (min): 18 min  Charges: OT General Charges $OT Visit: 1 Visit OT Treatments $Therapeutic Activity: 8-22 mins  Bradd Canary, OTR/L Acute Rehab Services Office: [phone]    Lorre Munroe 05/04/2021, 3:22 PM

## 2021-05-04 NOTE — Progress Notes (Signed)
Physical Therapy Treatment Patient Details Name: [name] MRN: [id number] DOB: 05-18-1961 Today's Date: 05/04/2021    History of Present Illness [name] is a 60 y.o. male admitted for working diagnosis of severe sepsis due to L diabetic foot infection/ulcer. MRI shows possible early osteomyelitis, septic joint arthritis with infected tenosynovitis. Awaiting further orthopedic recommendations. PMH: HTN, obesity, HLD, prediabetes, alcoholic cirrhosis, alcohol abuse.    PT Comments    Pt received in supine, agreeable to therapy session and with good participation and tolerance for transfer training. Emphasis on NWB on LLE but pt unable to maintain compliance even with +2 assist so instead focus on slide board transfer training. Pt interested in short term post-acute rehab so discussed with Supervising PT and pt/RN, will update DC recs to SNF at this time.   Follow Up Recommendations  SNF;Supervision for mobility/OOB;Other (comment) (pt now interested in SNF, if pt instead chooses home then HHPT and max services including aide and HHRN if possible)     Equipment Recommendations  Wheelchair (measurements PT);Wheelchair cushion (measurements PT);Other (comment) (slide board, ramp (pt has 3 STE))    Recommendations for Other Services       Precautions / Restrictions Precautions Precautions: Fall Precaution Comments: L heel non-healing ulcer with drainage Restrictions Weight Bearing Restrictions: Yes LLE Weight Bearing: Non weight bearing    Mobility  Bed Mobility Overal bed mobility: Modified Independent             General bed mobility comments: Flat HOB, no rail, increased time/effort    Transfers Overall transfer level: Needs assistance Equipment used: Rolling walker (2 wheeled);Sliding board Transfers: Sit to/from [employer];Lateral/Scoot Transfers Sit to Stand: Mod assist;+2 physical assistance;From elevated surface Stand pivot transfers: Mod  assist;+2 safety/equipment      Lateral/Scoot Transfers: With slide board;Min assist General transfer comment: stand pivot from bed>chair with RW and poor compliance with LLE NWB, then from chair<>bed with SB and minA, remained up in chair        Balance Overall balance assessment: Needs assistance Sitting-balance support: No upper extremity supported;Feet supported Sitting balance-Leahy Scale: Good Sitting balance - Comments: static sitting and weight shifting without LOB for SB placement   Standing balance support: Single extremity supported;During functional activity Standing balance-Leahy Scale: Poor Standing balance comment: BUE support and noncompliance with NWB on LLE despite +2 assist          Cognition Arousal/Alertness: Awake/alert Behavior During Therapy: WFL for tasks assessed/performed Overall Cognitive Status: No family/caregiver present to determine baseline cognitive functioning  General Comments: WFL cognitively for basic tasks/conversation, no evidence of hallucinating though variable awareness of functional abilities.      Exercises  STS from EOB/chair heights for UE/LE strengthening, second slide board transfer as TE for further UE strengthening.    General Comments General comments (skin integrity, edema, etc.): VSS on RA      Pertinent Vitals/Pain Pain Assessment: Faces Faces Pain Scale: Hurts a little bit Pain Location: L hip, sciatic nerve pain per pt Pain Descriptors / Indicators: Sore Pain Intervention(s): Monitored during session;Repositioned                   PT Goals (current goals can now be found in the care plan section) Acute Rehab PT Goals Patient Stated Goal: avoid surgery, for L foot to heal PT Goal Formulation: With patient Time For Goal Achievement: 05/14/21 Progress towards PT goals: Progressing toward goals    Frequency    Min 3X/week  PT Plan Discharge plan needs to be updated;Equipment recommendations need  to be updated       AM-PAC PT "6 Clicks" Mobility   Outcome Measure  Help needed turning from your back to your side while in a flat bed without using bedrails?: None Help needed moving from lying on your back to sitting on the side of a flat bed without using bedrails?: None Help needed moving to and from a bed to a chair (including a wheelchair)?: A Little Help needed standing up from a chair using your arms (e.g., wheelchair or bedside chair)?: A Lot Help needed to walk in hospital room?: Total Help needed climbing 3-5 steps with a railing? : Total 6 Click Score: 15    End of Session Equipment Utilized During Treatment: Gait belt Activity Tolerance: Patient tolerated treatment well Patient left: in chair;with call bell/phone within reach;with chair alarm set;with family/visitor present Nurse Communication: Mobility status;Other (comment);Weight bearing status (pt and girlfriend want to speak with case mgr about SNF options, use slide board pt unable to maintain NWB even with +2 while standing) PT Visit Diagnosis: Other abnormalities of gait and mobility (R26.89);Muscle weakness (generalized) (M62.81)     Time: 4650-3546 PT Time Calculation (min) (ACUTE ONLY): 36 min  Charges:  $Therapeutic Exercise: 8-22 mins $Therapeutic Activity: 8-22 mins                     [name] Reifschneider P., PTA Acute Rehabilitation Services Pager: [phone] Office: [phone]    [name] Palms 05/04/2021, 6:28 PM

## 2021-05-04 NOTE — Consult Note (Signed)
[facility] for Infectious Disease    Date of Admission:  04/27/2021      Total days of antibiotics 7   Vancomycin + Cefepime   Reason for Consult: DFU / Osteomyelitis / septic arthritis L foot/ankle.   Referring Provider: Arrien Primary Care Provider: Binnie Rail, MD   Assessment: [name] is a 60 y.o. male with chronic neuropathic plantar ulceration to the left foot now here with severe cellulitis and lymphedema that has progressed quickly in the days leading up to admission. He had no systemic symptoms during the course of this illness. MRI reading is quite concerning for extensive deep infection involving tibiotalar bone/joint, cuboid and 5th metatarsal osteomyelitis. Discussed how amputation would be curative and help in the long run to preserve his quadruple prosthetic joints from getting infected, however he feels this should only be a last resort and plans to follow up with ortho team in [place] for continuity of care (he saw them the day of admission). He would like to continue with IV treatment to see if this can be cured / suppressed for the hope of limb salvage.  ABIs done 04/29/21 L 0.99, R 1.05  Given his ETOH history will avoid flagyl and change him to IV invanz and continue vancomycin. Unlikely we will get daptomycin approved for SNF setting. Explained that SNF placement will likely have impact on choice of antibiotics but we will plan an empiric broad regimen to cover for typical DFU pathogens. I don't feel strongly that pseudomonas needs to be included.  Will follow up on disposition for final OPAT recommendations but likely these will stand.    Plan: OPAT as outlined below Pending SNF discharge Diabetes control Offloading of foot Needs to decongest L foot lymphedema - would he be an unna boot candidate?  Would call [facility] back to since recs were under the assumption he was having surgery.     OPAT ORDERS:  Diagnosis: DFU/ septic arthritis and  osteomyelitis L foot/ankle   Culture Result: none done  Allergies  Allergen Reactions   Claritin [Loratadine] Shortness Of Breath and Anxiety     Discharge antibiotics to be given via PICC line:  Per pharmacy protocol Vancomycin  Aim for Vancomycin trough 15-20 or AUC 400-550 (unless otherwise indicated)  AND Ertapenem IV daily   Duration: 6 weeks    End Date: August 22  [place] Per Protocol with [employer] Use: Home health RN for IV administration and teaching, line care and labs.    Labs weekly while on IV antibiotics: _x_ CBC with differential _x_ BMP TWICE WEEKLY __ CMP __ CRP __ ESR _x_ Vancomycin trough __ CK   _x_ Please pull PIC at completion of IV antibiotics __ Please leave PIC in place until doctor has seen patient or been notified  Fax weekly labs to [phone]  Clinic Follow Up Appt: 8/03 @ 10:15 via telephone with Dr. Tommy Medal   Principal Problem:   Severe sepsis [facility]) Active Problems:   Hypertension   Morbid obesity ([place])   OSA (obstructive sleep apnea)   Hyperlipidemia   Prediabetes   Ulcer of left heel (HCC)   Anemia   Hypothyroidism   Alcoholic cirrhosis of liver without ascites (HCC)   Alcoholic peripheral neuropathy (HCC)   Osteomyelitis (HCC)   Alcohol abuse   Diabetic foot ulcer (HCC)    Chlorhexidine Gluconate Cloth  6 each Topical Daily   DULoxetine  20 mg Oral Daily  enoxaparin (LOVENOX) injection  60 mg Subcutaneous Q24H   feeding supplement (GLUCERNA SHAKE)  237 mL Oral TID BM   folic acid  1 mg Oral Daily   gabapentin  100 mg Oral TID   insulin aspart  0-9 Units Subcutaneous Q4H   levothyroxine  50 mcg Oral Q0600   metroNIDAZOLE  500 mg Oral Q12H   multivitamin with minerals  1 tablet Oral Daily   senna-docusate  1 tablet Oral BID   sodium chloride flush  10-40 mL Intracatheter Q12H   sodium chloride flush  3 mL Intravenous Q12H   thiamine  100 mg Oral Daily    HPI: [name] is a 60 y.o. male admitted  to the hospital for left foot infection of a chronic plantar ulcer.   PMHx included for neuropathy, diabetes type 2, HTN, CHF, ETOH use, multiple joint replacements (b/l knees and hips), obesity, OSA.   [name] says that this ulcer first first showed up about 2 years ago. He is completely insensate to bilateral feet from neuropathy. Had in the past been working with wound care clinic near [name] but not recently. Swelling to the foot has worsened and ascended to involve the ankle/lower shin. He was not having any systemic symptoms of illness/infection at home prior to hospitalization. He was seen for routine office visit 7/11 with [facility] recommended evaluation in ER for IV antibiotics and has been here since 7/11. They did not at the time of the visit recommend amputation given the severe swelling (cellulitis/lymphedema) immediately but wanted him to get started on IV antibiotics.   He came to [facility] later that evening and transferred to [facility] for ortho eval.  Orthopedic team here has seen him and recommended BKA with MRI read indicating bone marrow edema within the 5th metatarsal base concerning for acute osteomyelitis; also with signal abnormalities involving cuboid bone and large tibiotalar joint effusion with synovial enhancement. Large volume fluid collections at the posteromedial and posterolateral aspects of ankle that are contiguous with the tibiotalar joint fluid. This also extends to the lateral aspect of the Achilles tendon indicating tenosynovitis as well as septic arthritis. [name] would like to proceed with medical management for foot salvage attempt with IV antibiotics and had a PICC line placed prior to our arrival. He had a mild leukocytosis upon presentation that has resolved (actually a bit leukopenic for a few days 3.5 nadir). Afebrile throughout the hospitalization. Currently getting cefepime and vancomycin IV.   He has a h/o  bilateral knee and hip prosthesis that are currently not bothering him. Swelling has improved since admission on current antimicrobial therapy. He has not had any side effects to antibiotics he has received.  He prefers to keep care for orthopedics via [facility] team if possible. He is interested in getting set up for SNF to receive cares to help with IV treatment.  Lives with his girlfriend but she is not able to durably provide the care he needs as long as he needs.    Review of Systems: Review of Systems  Constitutional:  Negative for chills, diaphoresis, fever and malaise/fatigue.  Respiratory:  Negative for cough and shortness of breath.   Cardiovascular:  Positive for leg swelling. Negative for chest pain.  Gastrointestinal:  Negative for abdominal pain and diarrhea.  Genitourinary:  Negative for dysuria.  Musculoskeletal:  Negative for falls, joint pain and myalgias.  Skin:  Positive for rash (red swollen rasn from foot to knee on  the left).  Neurological:  Negative for focal weakness, weakness and headaches.    Past Medical History:  Diagnosis Date   Alcohol dependence ([place])    Arthritis    hips, hands   Bilateral carpal tunnel syndrome    Bilateral leg edema    Chronic   CHF (congestive heart failure) (HCC)    Diabetes mellitus without complication (HCC)    type 2   Diverticulitis    portion of colon removed   DOE (dyspnea on exertion)    occ   Elevated liver enzymes    Fatty liver    GERD (gastroesophageal reflux disease)    occ   Hammer toe    Hip pain    History of ventral hernia repair 2016   x2   Hyperlipidemia    pt unsure   Hypertension    Marijuana abuse    Morbid obesity ([place])    Neuromuscular disorder ([place])    peripheral neuropathy feet and few fingers   OSA (obstructive sleep apnea)    has OSA-not used CPAP 2-3 yrs could not tolerate cpap   PONV (postoperative nausea and vomiting)    Toe ulcer (HCC)    left healed    Social History   Tobacco  Use   Smoking status: Former    Packs/day: 0.25    Years: 10.00    Pack years: 2.50    Types: Cigarettes   Smokeless tobacco: Never   Tobacco comments:    quit 2018  Vaping Use   Vaping Use: Never used  Substance Use Topics   Alcohol use: Not Currently    Comment: 2-3 drinks 4-5 nights a week, liquor   Drug use: Yes    Frequency: 1.0 times per week    Types: Marijuana    Comment: "4 times a month, maybe"    Family History  Adopted: Yes  Family history unknown: Yes   Allergies  Allergen Reactions   Claritin [Loratadine] Shortness Of Breath and Anxiety    OBJECTIVE: Blood pressure (!) 146/86, pulse 98, temperature 97.8 F (36.6 C), temperature source Oral, resp. rate 18, height 6' 1"  (1.854 m), weight 127 kg, SpO2 95 %.   Physical Exam Vitals reviewed.  HENT:     Mouth/Throat:     Mouth: No oral lesions.     Dentition: Normal dentition. No dental caries.  Eyes:     General: No scleral icterus. Cardiovascular:     Rate and Rhythm: Normal rate and regular rhythm.     Heart sounds: Normal heart sounds.  Pulmonary:     Effort: Pulmonary effort is normal.     Breath sounds: Normal breath sounds.  Abdominal:     General: There is no distension.     Palpations: Abdomen is soft.     Tenderness: There is no abdominal tenderness.  Musculoskeletal:     Comments: LLE swollen from foot to upper shin. Foot wound is covered with dry dressing but viewable photos in media tab. He has pitting lymphedema to the left leg and blanchable erythema present though improved from prior imaging 1 week ago.  Chronic venous stasis dermatitis changes underlying bilateral legs.   Lymphadenopathy:     Cervical: No cervical adenopathy.  Skin:    General: Skin is warm and dry.     Findings: No rash.  Neurological:     Mental Status: He is alert and oriented to person, place, and time.   7/11 admission photos  Lab Results Lab Results  Component Value Date   WBC 4.4  05/04/2021   HGB 10.5 (L) 05/04/2021   HCT 32.3 (L) 05/04/2021   MCV 97.9 05/04/2021   PLT 264 05/04/2021    Lab Results  Component Value Date   CREATININE 0.73 05/04/2021   BUN 5 (L) 05/04/2021   NA 135 05/04/2021   K 3.9 05/04/2021   CL 103 05/04/2021   CO2 23 05/04/2021    Lab Results  Component Value Date   ALT 22 04/28/2021   AST 35 04/28/2021   ALKPHOS 150 (H) 04/28/2021   BILITOT 3.8 (H) 04/28/2021     Microbiology: Recent Results (from the past 240 hour(s))  Blood Cultures x 2 sites     Status: None   Collection Time: 04/27/21 10:24 PM   Specimen: BLOOD  Result Value Ref Range Status   Specimen Description   Final    BLOOD LEFT ARM Performed at [facility], [place] [address], [place], [place] [address]    Special Requests   Final    BOTTLES DRAWN AEROBIC AND ANAEROBIC Blood Culture results may not be optimal due to an inadequate volume of blood received in culture bottles Performed at [facility] [address] [place], [place] [address]    Culture   Final    NO GROWTH 5 DAYS Performed at [facility], [place] [address], [place], [place] [address]    Report Status 05/03/2021 FINAL  Final  Blood Cultures x 2 sites     Status: None   Collection Time: 04/27/21 10:53 PM   Specimen: BLOOD  Result Value Ref Range Status   Specimen Description   Final    BLOOD FINGER Performed at [facility] [address], [place], [place] [address]    Special Requests   Final    BOTTLES DRAWN AEROBIC AND ANAEROBIC Blood Culture adequate volume Performed at [facility] [address], [place], [place] [address]    Culture   Final    NO GROWTH 5 DAYS Performed at [facility], [place] [address], [place], [place] [address]    Report Status 05/03/2021 FINAL  Final  Resp Panel by RT-PCR (Flu A&B, Covid) Nasopharyngeal Swab     Status: None   Collection Time: 04/28/21 12:00 AM   Specimen:  Nasopharyngeal Swab; Nasopharyngeal(NP) swabs in vial transport medium  Result Value Ref Range Status   SARS Coronavirus 2 by RT PCR NEGATIVE NEGATIVE Final    Comment: (NOTE) SARS-CoV-2 target nucleic acids are NOT DETECTED.  The SARS-CoV-2 RNA is generally detectable in upper respiratory specimens during the acute phase of infection. The lowest concentration of SARS-CoV-2 viral copies this assay can detect is 138 copies/mL. A negative result does not preclude SARS-Cov-2 infection and should not be used as the sole basis for treatment or other patient management decisions. A negative result may occur with  improper specimen collection/handling, submission of specimen other than nasopharyngeal swab, presence of viral mutation(s) within the areas targeted by this assay, and inadequate number of viral copies(<138 copies/mL). A negative result must be combined with clinical observations, patient history, and epidemiological information. The expected result is Negative.  Fact Sheet for Patients:  EntrepreneurPulse.com.au  Fact Sheet for Healthcare Providers:  IncredibleEmployment.be  This test is no t yet approved or cleared by the [place] FDA and  has been authorized for detection and/or diagnosis of SARS-CoV-2 by FDA under an Emergency Use  Authorization (EUA). This EUA will remain  in effect (meaning this test can be used) for the duration of the COVID-19 declaration under Section 564(b)(1) of the Act, 21 U.S.C.section 360bbb-3(b)(1), unless the authorization is terminated  or revoked sooner.       Influenza A by PCR NEGATIVE NEGATIVE Final   Influenza B by PCR NEGATIVE NEGATIVE Final    Comment: (NOTE) The Xpert Xpress SARS-CoV-2/FLU/RSV plus assay is intended as an aid in the diagnosis of influenza from Nasopharyngeal swab specimens and should not be used as a sole basis for treatment. Nasal washings and aspirates are unacceptable for  Xpert Xpress SARS-CoV-2/FLU/RSV testing.  Fact Sheet for Patients: EntrepreneurPulse.com.au  Fact Sheet for Healthcare Providers: IncredibleEmployment.be  This test is not yet approved or cleared by the [place] FDA and has been authorized for detection and/or diagnosis of SARS-CoV-2 by FDA under an Emergency Use Authorization (EUA). This EUA will remain in effect (meaning this test can be used) for the duration of the COVID-19 declaration under Section 564(b)(1) of the Act, 21 U.S.C. section 360bbb-3(b)(1), unless the authorization is terminated or revoked.  Performed at [facility], [place] [address] [place], [place] [address]     Janene Madeira, MSN, NP-C [facility] for Infectious [place] Cell: [phone] Pager: [phone]  05/04/2021 12:51 PM

## 2021-05-04 NOTE — Progress Notes (Signed)
PROGRESS NOTE    [name]  YKZ:[id number] DOB: [birth year] DOA: 04/27/2021 PCP: [name], MD    Brief Narrative:  Mr. [name] was admitted to the hospital with the working diagnosis of severe sepsis due to left diabetic foot infection, possible osteomyelitis.    60 year old male past medical history for type 2 diabetes mellitus, hypertension, heart failure, alcohol abuse, obesity and obstructive sleep apnea who presented with left foot infection.  Reported chronic left foot that lately developed worsening edema and expanding erythema.  On the day of hospitalization he was seen at the outpatient clinic and was referred to the hospital for IV antibiotics.  On his initial physical examination his blood pressure was 147/112, heart rate 128, respiratory rate 12, temperature 98.8, oxygen saturation 93%, his lungs were clear to auscultation bilaterally, heart S1 -S2, present, tachycardic, his abdomen was soft nontender, his left leg was edematous, positive distal lymphedema extending up to the mid leg, midfoot plantar deep ulcerated wound.   Sodium 132, potassium 3.0, chloride 92, bicarb 23, glucose 156, BUN 10, creatinine 0.78, lactic acid 4.0, white count 15.9, hemoglobin 11.6, hematocrit 34.0, platelets 154 SARS COVID-19 negative.   Urinalysis specific gravity 1.012, urine sodium less than 10, urine osmolality 379.   Left foot radiograph with considerable soft tissue swelling and plantar diabetic foot ulcer.  Mottled appearance of the base of the fifth metatarsal.   EKG 115 bpm, normal axis, normal intervals, sinus rhythm, no significant ST segment or T wave changes.   Patient placed on broad spectrum antibiotic therapy. Further workup with foot MRI showed large plantar ulceration with sinus tract underlying the 5th metatarsal. Suspected 5 th metatarsal osteomyelitis. Left ankle with signs of septic arthritis with infected tenosynovitis.    Orthopedics recommendation for below the knee  amputation.    I have explained to the patient in detail his condition, severe left foot infection including bone and ankle joint involvement. Risk of worsening infection, blood stream invasion and death. He is not decided yet about surgery.    Patient continue to decline surgical intervention and will prefer IV antibiotics for 4 to 6 weeks before considering amputation.    Plan to order PICC line and consult ID for further antibiotic treatment.  TOC consult for SNF placement for IV antibiotics    Assessment & Plan:   Principal Problem:   Severe sepsis (HCC) Active Problems:   Hypertension   Morbid obesity (HCC)   OSA (obstructive sleep apnea)   Hyperlipidemia   Prediabetes   Ulcer of left heel (HCC)   Anemia   Hypothyroidism   Alcoholic cirrhosis of liver without ascites (HCC)   Alcoholic peripheral neuropathy (HCC)   Osteomyelitis (HCC)   Alcohol abuse   Diabetic foot ulcer (HCC)   Left foot diabetic foot infection/ cellulitis/ possible osteomyelitis, complicated with severe sepsis (end-organ damage elevated lactic acid) present on admission. MRI with signs of osteomyelitis of the 5th metatarsal and septic joint of the left ankle.  Orthopedics has recommended below the knee amputation on the left lower extremity.   Today's wbc is 4,4 Patient continue to decline surgery at this point in time.    Continue vancomycin and cefepime, for antibiotic therapy, will consult ID for further recommendations.  Order PICC line for long term outpatient antibiotic therapy.  Wound care as palliative measure.    2. Alcohol abuse/ chronic alcoholic cirrhosis.  no  further signs of alcohol withdrawal. Continue with as needed oral lorazepam.   3. Chronic diastolic congested  heart failure/ HTN  No clinical signs of exacerbation.    4, Hypothyroid Continue with Levothyroxine.   5. Obesity class 2. OSA  Calculated BMI is 36,9. on Cpap at night. Continue to encourage mobility, out of bed  to chair tid with meals.    6. T2DM. Glucose continue to be well controlled, continue with insulin scale. Diabetic neuropathy, on gabapentin.   7. Hypokalemia/ hyponatremia.  Renal function continue to be stable, electrolytes have normalized and patient is tolerating po well.    8. Depression.  No confusion or agitation. No duloxetine, trazodone and hydroxyzine.     Status is: Inpatient  Remains inpatient appropriate because:IV treatments appropriate due to intensity of illness or inability to take PO  Dispo: The patient is from: Home              Anticipated d/c is to: SNF              Patient currently is not medically stable to d/c.   Difficult to place patient No   DVT prophylaxis: Enoxaparin   Code Status:   full  Family Communication:  No family at the bedside      Nutrition Status: Nutrition Problem: Increased nutrient needs Etiology: wound healing Signs/Symptoms: estimated needs Interventions: Glucerna shake    Consultants:  Orthopedic surgery   Antimicrobials:  Vancomycin and cefepime     Subjective: Patient is feeling well, but not yet back to baseline, continue to have left lower leg edema, and decreased mobility, no tremors or anxiety, no nausea or vomiting.   Objective: Vitals:   05/03/21 2333 05/04/21 0340 05/04/21 0342 05/04/21 0836  BP: 135/64  (!) 151/85 (!) 146/86  Pulse: 96  100 98  Resp:   18 18  Temp: 98.6 F (37 C)  97.7 F (36.5 C) 97.8 F (36.6 C)  TempSrc: Oral Oral Oral Oral  SpO2: 98%  98% 95%  Weight:      Height:        Intake/Output Summary (Last 24 hours) at 05/04/2021 0941 Last data filed at 05/04/2021 0734 Gross per 24 hour  Intake 1123.67 ml  Output 2100 ml  Net -976.33 ml   Filed Weights   04/27/21 1950  Weight: 127 kg    Examination:   General: Not in pain or dyspnea, deconditioned  Neurology: Awake and alert, non focal  E ENT: no pallor, no icterus, oral mucosa moist Cardiovascular: No JVD. S1-S2  present, rhythmic, no gallops, rubs, or murmurs. Left leg with ++ non pitting edema. Pulmonary: vesicular breath sounds bilaterally, adequate air movement, no wheezing, rhonchi or rales. Gastrointestinal. Abdomen protuberant Skin. Erythema on the anterior aspect of left leg Musculoskeletal: no joint deformities     Data Reviewed: I have personally reviewed following labs and imaging studies  CBC: Recent Labs  Lab 04/30/21 0440 05/01/21 0119 05/02/21 0125 05/03/21 0509 05/04/21 0500  WBC 5.7 3.9* 3.5* 3.6* 4.4  NEUTROABS 4.2 2.7 2.1 1.9 2.5  HGB 9.8* 10.0* 9.9* 10.9* 10.5*  HCT 28.9* 29.6* 30.0* 32.9* 32.3*  MCV 96.7 96.7 99.0 97.1 97.9  PLT 140* 158 173 243 264   Basic Metabolic Panel: Recent Labs  Lab 04/28/21 0239 04/28/21 0506 04/29/21 0149 04/30/21 0440 05/01/21 0119 05/02/21 0125 05/03/21 0509 05/04/21 0500  NA 132* 132* 131* 132* 134* 136 134* 135  K 3.3* 3.3* 3.7 3.0* 3.3* 3.8 3.8 3.9  CL 93* 95* 99 99 104 107 104 103  CO2 24 23 21* 25 25 21* 23  23  GLUCOSE 135* 127* 96 87 84 99 88 81  BUN 10 10 9 8 7 9 8  5*  CREATININE 0.73 0.71 0.75 0.77 0.67 0.63 0.69 0.73  CALCIUM 8.2* 7.8* 7.7* 8.2* 8.5* 8.5* 8.7* 9.2  MG 1.2* 1.3* 1.9  --   --  1.7  --   --   PHOS 3.2 3.1  --   --   --   --   --   --    GFR: Estimated Creatinine Clearance: 138.8 mL/min (by C-G formula based on SCr of 0.73 mg/dL). Liver Function Tests: Recent Labs  Lab 04/28/21 0239 04/28/21 0506  AST 39 35  ALT 23 22  ALKPHOS 147* 150*  BILITOT 3.7* 3.8*  PROT 7.7 6.5  ALBUMIN 2.4* 2.3*   No results for input(s): LIPASE, AMYLASE in the last 168 hours. No results for input(s): AMMONIA in the last 168 hours. Coagulation Profile: Recent Labs  Lab 04/28/21 0506  INR 1.3*   Cardiac Enzymes: Recent Labs  Lab 04/28/21 0239 04/28/21 0506  CKTOTAL 26* 56   BNP (last 3 results) Recent Labs    11/24/20 1343  PROBNP 26.0   HbA1C: No results for input(s): HGBA1C in the last 72  hours. CBG: Recent Labs  Lab 05/03/21 1618 05/03/21 2011 05/03/21 2332 05/04/21 0323 05/04/21 0840  GLUCAP 107* 96 88 79 122*   Lipid Profile: No results for input(s): CHOL, HDL, LDLCALC, TRIG, CHOLHDL, LDLDIRECT in the last 72 hours. Thyroid Function Tests: No results for input(s): TSH, T4TOTAL, FREET4, T3FREE, THYROIDAB in the last 72 hours. Anemia Panel: No results for input(s): VITAMINB12, FOLATE, FERRITIN, TIBC, IRON, RETICCTPCT in the last 72 hours.    Radiology Studies: I have reviewed all of the imaging during this hospital visit personally     Scheduled Meds:  DULoxetine  20 mg Oral Daily   enoxaparin (LOVENOX) injection  60 mg Subcutaneous Q24H   feeding supplement (GLUCERNA SHAKE)  237 mL Oral TID BM   folic acid  1 mg Oral Daily   gabapentin  100 mg Oral TID   insulin aspart  0-9 Units Subcutaneous Q4H   levothyroxine  50 mcg Oral Q0600   multivitamin with minerals  1 tablet Oral Daily   senna-docusate  1 tablet Oral BID   sodium chloride flush  3 mL Intravenous Q12H   thiamine  100 mg Oral Daily   Continuous Infusions:  sodium chloride     ceFEPime (MAXIPIME) IV 2 g (05/04/21 0005)   vancomycin 1,250 mg (05/04/21 0133)     LOS: 7 days        Andrell Tallman 05/06/21, MD

## 2021-05-04 NOTE — Progress Notes (Signed)
Peripherally Inserted Central Catheter Placement  The IV Nurse has discussed with the patient and/or persons authorized to consent for the patient, the purpose of this procedure and the potential benefits and risks involved with this procedure.  The benefits include less needle sticks, lab draws from the catheter, and the patient may be discharged home with the catheter. Risks include, but not limited to, infection, bleeding, blood clot (thrombus formation), and puncture of an artery; nerve damage and irregular heartbeat and possibility to perform a PICC exchange if needed/ordered by physician.  Alternatives to this procedure were also discussed.  Bard Power PICC patient education guide, fact sheet on infection prevention and patient information card has been provided to patient /or left at bedside.    PICC Placement Documentation  PICC Single Lumen 05/04/21 Right Basilic 48 cm (Active)  Indication for Insertion or Continuance of Line Home intravenous therapies (PICC only) 05/04/21 1204  Exposed Catheter (cm) 1 cm 05/04/21 1204  Site Assessment Clean;Dry;Intact 05/04/21 1204  Line Status Flushed;Saline locked;Blood return noted 05/04/21 1204  Dressing Type Transparent;Securing device 05/04/21 1204  Dressing Status Clean;Dry;Intact 05/04/21 1204  Antimicrobial disc in place? Yes 05/04/21 1204  Safety Lock Not Applicable 05/04/21 1204  Dressing Change Due 05/11/21 05/04/21 1204       Romie Jumper 05/04/2021, 12:08 PM

## 2021-05-04 NOTE — NC FL2 (Signed)
[place] MEDICAID FL2 LEVEL OF CARE SCREENING TOOL     IDENTIFICATION  Patient Name: [name] Birthdate: 06-14-1961 Sex: male Admission Date (Current Location): 04/27/2021  [facility] and [place] Number:  Producer, television/film/video and Address:  [facility], [address] [place], [place] [address]      Provider Number: [id number]  Attending Physician Name and Address:  Coralie Keens  Relative Name and Phone Number:       Current Level of Care: Hospital Recommended Level of Care: Skilled Nursing Facility Prior Approval Number:    Date Approved/Denied:   PASRR Number: [id number]  Discharge Plan: SNF    Current Diagnoses: Patient Active Problem List   Diagnosis Date Noted   Alcohol abuse 04/28/2021   Diabetic foot ulcer (HCC) 04/28/2021   Severe sepsis (HCC) 04/28/2021   Osteomyelitis (HCC) 04/27/2021   Anxiety 03/17/2021   Amputated toe of right foot (HCC) 03/03/2020   Alcoholic peripheral neuropathy (HCC) 10/28/2019   Chronic diastolic CHF (congestive heart failure) (HCC) 10/15/2019   Chronic foot ulcer (HCC) 10/15/2019   Alcoholic cirrhosis of liver without ascites (HCC) 04/27/2019   Obesity hypoventilation syndrome (HCC) 04/27/2019   Depression 04/25/2019   Severe alcohol use disorder (HCC)    DOE (dyspnea on exertion) 04/22/2019   Hypothyroidism 04/22/2019   Anemia 02/06/2019   Acute on chronic diastolic CHF (congestive heart failure) (HCC) 01/30/2019   Avascular necrosis of hip, left (HCC) 11/02/2018   Decreased hearing of both ears 10/30/2018   Avascular necrosis of hip, right (HCC) 08/16/2018   Bilateral leg edema 02/27/2018   Marijuana abuse 02/16/2018   Ulcer of left heel (HCC) 12/21/2016   Bilateral carpal tunnel syndrome 12/15/2016   Hyperlipidemia 11/25/2016   Prediabetes 11/25/2016   Hypertension 11/23/2016   History of osteomyelitis 11/23/2016   Morbid obesity (HCC) 11/23/2016   OSA (obstructive sleep apnea)  11/23/2016   Other hammer toe (acquired) 11/11/2013   Exstrophy of bladder 11/11/2013    Orientation RESPIRATION BLADDER Height & Weight     Self, Time, Situation, Place  Normal Continent Weight: 280 lb (127 kg) Height:  6\' 1"  (185.4 cm)  BEHAVIORAL SYMPTOMS/MOOD NEUROLOGICAL BOWEL NUTRITION STATUS      Continent Diet (please see discharge sumnmary)  AMBULATORY STATUS COMMUNICATION OF NEEDS Skin   Supervision Verbally Other (Comment) (diabetic ulcer,heel left posterior)                       Personal Care Assistance Level of Assistance  Bathing, Feeding, Dressing Bathing Assistance: Limited assistance Feeding assistance: Independent Dressing Assistance: Limited assistance     Functional Limitations Info  Sight, Hearing, Speech Sight Info: Impaired Hearing Info: Adequate Speech Info: Adequate    SPECIAL CARE FACTORS FREQUENCY  PT (By licensed PT), OT (By licensed OT)     PT Frequency: 5x per week OT Frequency: 5x per week            Contractures Contractures Info: Not present    Additional Factors Info  Code Status, Allergies Code Status Info: FULL Allergies Info: Claritin           Current Medications (05/04/2021):  This is the current hospital active medication list Current Facility-Administered Medications  Medication Dose Route Frequency Provider Last Rate Last Admin   0.9 %  sodium chloride infusion  250 mL Intravenous PRN Doutova, Anastassia, MD       acetaminophen (TYLENOL) tablet 650 mg  650 mg Oral Q6H PRN  Therisa Doyne, MD   650 mg at 04/30/21 7902   Or   acetaminophen (TYLENOL) suppository 650 mg  650 mg Rectal Q6H PRN Therisa Doyne, MD       Chlorhexidine Gluconate Cloth 2 % PADS 6 each  6 each Topical Daily Arrien, York Ram, MD       DULoxetine (CYMBALTA) DR capsule 20 mg  20 mg Oral Daily Arrien, York Ram, MD   20 mg at 05/04/21 1018   enoxaparin (LOVENOX) injection 60 mg  60 mg Subcutaneous Q24H Coralie Keens, MD   60 mg at 05/03/21 1635   ertapenem (INVANZ) 1,000 mg in sodium chloride 0.9 % 100 mL IVPB  1 g Intravenous Q24H Daiva Eves, Lisette Grinder, MD       feeding supplement (GLUCERNA SHAKE) (GLUCERNA SHAKE) liquid 237 mL  237 mL Oral TID BM Arrien, York Ram, MD   237 mL at 05/04/21 1030   folic acid (FOLVITE) tablet 1 mg  1 mg Oral Daily Doutova, Anastassia, MD   1 mg at 05/04/21 1018   gabapentin (NEURONTIN) capsule 100 mg  100 mg Oral TID Therisa Doyne, MD   100 mg at 05/04/21 1018   HYDROcodone-acetaminophen (NORCO/VICODIN) 5-325 MG per tablet 1 tablet  1 tablet Oral Q4H PRN Arrien, York Ram, MD   1 tablet at 05/04/21 1019   hydrOXYzine (ATARAX/VISTARIL) tablet 25 mg  25 mg Oral Q6H PRN Arrien, York Ram, MD       insulin aspart (novoLOG) injection 0-9 Units  0-9 Units Subcutaneous Q4H Therisa Doyne, MD   1 Units at 04/30/21 2032   levothyroxine (SYNTHROID) tablet 50 mcg  50 mcg Oral Q0600 Therisa Doyne, MD   50 mcg at 05/04/21 0618   LORazepam (ATIVAN) tablet 1 mg  1 mg Oral Q4H PRN Arrien, York Ram, MD       multivitamin with minerals tablet 1 tablet  1 tablet Oral Daily Doutova, Anastassia, MD   1 tablet at 05/04/21 1018   senna-docusate (Senokot-S) tablet 1 tablet  1 tablet Oral BID Arrien, York Ram, MD   1 tablet at 05/03/21 2132   sodium chloride flush (NS) 0.9 % injection 10-40 mL  10-40 mL Intracatheter Q12H Arrien, York Ram, MD       sodium chloride flush (NS) 0.9 % injection 10-40 mL  10-40 mL Intracatheter PRN Arrien, York Ram, MD       sodium chloride flush (NS) 0.9 % injection 3 mL  3 mL Intravenous Q12H Doutova, Anastassia, MD   3 mL at 05/03/21 0914   sodium chloride flush (NS) 0.9 % injection 3 mL  3 mL Intravenous PRN Doutova, Jonny Ruiz, MD       thiamine tablet 100 mg  100 mg Oral Daily Doutova, Anastassia, MD   100 mg at 05/04/21 1018   traZODone (DESYREL) tablet 50 mg  50 mg Oral QHS PRN Therisa Doyne,  MD       vancomycin (VANCOREADY) IVPB 1250 mg/250 mL  1,250 mg Intravenous Q12H Coralie Keens, MD 166.7 mL/hr at 05/04/21 0133 1,250 mg at 05/04/21 0133     Discharge Medications: Please see discharge summary for a list of discharge medications.  Relevant Imaging Results:  Relevant Lab Results:   Additional Information SSN [id number]  Pfizer COVID-19 Vaccine 09/19/2020 , 02/11/2020 , 01/17/2020  Eduard Roux, LCSW

## 2021-05-04 NOTE — Progress Notes (Signed)
Dressing changed per order

## 2021-05-04 NOTE — Progress Notes (Signed)
Subjective:   Procedure(s) (LRB): AMPUTATION BELOW KNEE (Left)  Patient resting comfortably in bed.  Objective:   VITALS:  Temp:  [97.7 F (36.5 C)-98.6 F (37 C)] 97.8 F (36.6 C) (07/18 0836) Pulse Rate:  [80-100] 98 (07/18 0836) Resp:  [18-19] 18 (07/18 0836) BP: (121-156)/(64-88) 146/86 (07/18 0836) SpO2:  [93 %-98 %] 95 % (07/18 0836)  General: WDWN patient in NAD. Psych:  Appropriate mood and affect. Neuro:  A&O x 3, Moving all extremities, sensation intact to light touch HEENT:  EOMs intact Chest:  Even non-labored respirations Skin:  Dressing C/D/I, no rashes or lesions Extremities: warm/dry, mod edema to L LE,  mild diffuse erythema, no ecchymosis.  No lymphadenopathy. Pulses: Popliteus 2+ MSK:  ROM: EHL/FHL intact, MMT: able to perform quad set   LABS Recent Labs    05/02/21 0125 05/03/21 0509 05/04/21 0500  HGB 9.9* 10.9* 10.5*  WBC 3.5* 3.6* 4.4  PLT 173 243 264   Recent Labs    05/03/21 0509 05/04/21 0500  NA 134* 135  K 3.8 3.9  CL 104 103  CO2 23 23  BUN 8 5*  CREATININE 0.69 0.73  GLUCOSE 88 81   No results for input(s): LABPT, INR in the last 72 hours.   Assessment/Plan:   Procedure(s) (LRB): AMPUTATION BELOW KNEE (Left)  L foot osteomyelitis and septic ankle arthritis  Patient declines L BKA He elects for 4-6 weeks of IV ABX ABX per ID Patient reports that he will follow up in the outpatient setting with his orthopedic provider at [facility]. Ortho signing off  Lolly Mustache Office:  [phone]

## 2021-05-04 NOTE — Care Management Important Message (Signed)
Important Message  Patient Details  Name: [name] MRN: [id number] Date of Birth: 1961/09/14   Medicare Important Message Given:  Yes     Renie Ora 05/04/2021, 10:22 AM

## 2021-05-04 NOTE — TOC Initial Note (Addendum)
Transition of Care (TOC) - Initial/Assessment Note  Donn Pierini RN, BSN Transitions of Care Unit 4E- RN Case Manager See Treatment Team for direct phone #    Patient Details  Name: [name] MRN: [id number] Date of Birth: 24-Dec-1960  Transition of Care [facility]) CM/SW Contact:    Darrold Span, RN Phone Number: 05/04/2021, 2:10 PM  Clinical Narrative:                 Pt admitted from home with severe sepsis, declining BKA at this time and wants to do 4-6 wks of IV abx. PICC line has been placed.  CM in to speak with pt at bedside for transition of care plans/needs.  Discussed plan for IV abx, and home infusion needs- ID has reached out to [facility] for home abx needs- list provided to pt for [facility] choice to coordinate [facility] needs- Per CMS guidelines from medicare.gov website with star ratings (copy placed in shadow chart) - per pt he has used [facility] in the past- however during discussion of HH needs pt voiced that he thought he was going to a "rehab to be taken care of" as he does not have 24/7 care at home. He reports that he GF is in sales and travels a lot - sometimes being gone for days at a time with her job. He does not feel like he will be able to do for himself at home alone without assistance. Explained that PT/OT will need to come re-eval him and update recommendations before we can move forward with looking into rehab options. Pt agreeable to SNF search.  Will have PT/OT see pt for updated recommendations as to safest transition plan SNF vs home with [facility].   Have notified Pam with [facility] that plan may be for SNF- will update as plan confirmed.   Pt does confirm that he has RW, BSC, and cane at home, he would need w/c for home.   TOC to follow  1525- spoke with CSW, will start SNF work up and follow up with bed offers.   Expected Discharge Plan: Skilled Nursing Facility Barriers to Discharge: Continued Medical Work up, SNF Pending bed  offer   Patient Goals and CMS Choice Patient states their goals for this hospitalization and ongoing recovery are:: need to go to rehab for IV abx CMS Medicare.gov Compare Post Acute Care list provided to:: Patient Choice offered to / list presented to : Patient  Expected Discharge Plan and Services Expected Discharge Plan: Skilled Nursing Facility In-house Referral: Clinical Social Work Discharge Planning Services: CM Consult Post Acute Care Choice: Home Health, Durable Medical Equipment, Skilled Nursing Facility Living arrangements for the past 2 months: Single Family Home                           HH Arranged: IV Antibiotics HH Agency: [facility] Date HH Agency Contacted: 05/04/21      Prior Living Arrangements/Services Living arrangements for the past 2 months: Single Family Home Lives with:: Self Patient language and need for interpreter reviewed:: Yes Do you feel safe going back to the place where you live?: No   I need to go to rehab first before I go home  Need for Family Participation in Patient Care: Yes (Comment) Care giver support system in place?: No (comment) Current home services: DME (RW, BSC, cane) Criminal Activity/Legal Involvement Pertinent to Current Situation/Hospitalization: No - Comment as needed  Activities of Daily  Living Home Assistive Devices/Equipment: [name] (specify quad or straight) (single point cane) ADL Screening (condition at time of admission) Patient's cognitive ability adequate to safely complete daily activities?: Yes Is the patient deaf or have difficulty hearing?: No Does the patient have difficulty seeing, even when wearing glasses/contacts?: No Does the patient have difficulty concentrating, remembering, or making decisions?: No Patient able to express need for assistance with ADLs?: Yes Does the patient have difficulty dressing or bathing?: No Independently performs ADLs?: No Communication: Independent Dressing (OT):  Independent Grooming: Independent Feeding: Independent Bathing: Independent Toileting: Needs assistance Is this a change from baseline?: Change from baseline, expected to last >3days In/Out Bed: Needs assistance Is this a change from baseline?: Change from baseline, expected to last >3 days Walks in Home: Needs assistance Is this a change from baseline?: Change from baseline, expected to last >3 days Does the patient have difficulty walking or climbing stairs?: Yes (secondary to left swollen and painful foot) Weakness of Legs: Left Weakness of Arms/Hands: None  Permission Sought/Granted Permission sought to share information with : Facility Industrial/product designer granted to share information with : Yes, Verbal Permission Granted     Permission granted to share info w AGENCY: SNF        Emotional Assessment Appearance:: Appears stated age Attitude/Demeanor/Rapport: Engaged Affect (typically observed): Appropriate Orientation: : Oriented to Self, Oriented to Place, Oriented to  Time, Oriented to Situation Alcohol / Substance Use: Not Applicable Psych Involvement: No (comment)  Admission diagnosis:  Osteomyelitis (HCC) [M86.9] Preop cardiovascular exam [Z01.810] Foot infection [L08.9] Diabetic ulcer of left midfoot associated with type 2 diabetes mellitus, unspecified ulcer stage (HCC) [U04.540, L97.429] Patient Active Problem List   Diagnosis Date Noted   Alcohol abuse 04/28/2021   Diabetic foot ulcer (HCC) 04/28/2021   Severe sepsis (HCC) 04/28/2021   Osteomyelitis (HCC) 04/27/2021   Anxiety 03/17/2021   Amputated toe of right foot (HCC) 03/03/2020   Alcoholic peripheral neuropathy (HCC) 10/28/2019   Chronic diastolic CHF (congestive heart failure) (HCC) 10/15/2019   Chronic foot ulcer (HCC) 10/15/2019   Alcoholic cirrhosis of liver without ascites (HCC) 04/27/2019   Obesity hypoventilation syndrome (HCC) 04/27/2019   Depression 04/25/2019   Severe alcohol  use disorder (HCC)    DOE (dyspnea on exertion) 04/22/2019   Hypothyroidism 04/22/2019   Anemia 02/06/2019   Acute on chronic diastolic CHF (congestive heart failure) (HCC) 01/30/2019   Avascular necrosis of hip, left (HCC) 11/02/2018   Decreased hearing of both ears 10/30/2018   Avascular necrosis of hip, right (HCC) 08/16/2018   Bilateral leg edema 02/27/2018   Marijuana abuse 02/16/2018   Ulcer of left heel (HCC) 12/21/2016   Bilateral carpal tunnel syndrome 12/15/2016   Hyperlipidemia 11/25/2016   Prediabetes 11/25/2016   Hypertension 11/23/2016   History of osteomyelitis 11/23/2016   Morbid obesity (HCC) 11/23/2016   OSA (obstructive sleep apnea) 11/23/2016   Other hammer toe (acquired) 11/11/2013   Exstrophy of bladder 11/11/2013   PCP:  [name], MD Pharmacy:   [id number] [place], [place] - [address] [place] [address] Phone: [phone] Fax: [phone]     Social Determinants of Health (SDOH) Interventions    Readmission Risk Interventions Readmission Risk Prevention Plan 03/04/2020  Transportation Screening Complete  PCP or Specialist Appt within 3-5 Days Complete  HRI or Home Care Consult Complete  Social Work Consult for Recovery Care Planning/Counseling Complete  Palliative Care Screening Complete  Medication Review Oceanographer) Complete  Some  recent data might be hidden

## 2021-05-04 NOTE — Plan of Care (Signed)
  Problem: Clinical Measurements: Goal: Will remain free from infection Outcome: Progressing   Problem: Health Behavior/Discharge Planning: Goal: Ability to manage health-related needs will improve Outcome: Not Progressing   

## 2021-05-05 ENCOUNTER — Institutional Professional Consult (permissible substitution): Payer: Medicare Other | Admitting: Plastic Surgery

## 2021-05-05 ENCOUNTER — Encounter (HOSPITAL_COMMUNITY)
Admission: EM | Disposition: A | Discharge status: Skilled Nursing Facility | Payer: Self-pay | Source: Home / Self Care | Attending: Internal Medicine

## 2021-05-05 LAB — GLUCOSE, CAPILLARY
Glucose-Capillary: 82 mg/dL (ref 70–99)
Glucose-Capillary: 83 mg/dL (ref 70–99)
Glucose-Capillary: 119 mg/dL — ABNORMAL HIGH (ref 70–99)
Glucose-Capillary: 114 mg/dL — ABNORMAL HIGH (ref 70–99)
Glucose-Capillary: 124 mg/dL — ABNORMAL HIGH (ref 70–99)

## 2021-05-05 LAB — SARS CORONAVIRUS 2 (TAT 6-24 HRS): SARS Coronavirus 2: NEGATIVE

## 2021-05-05 SURGERY — AMPUTATION BELOW KNEE
Anesthesia: Choice | Site: Knee | Laterality: Left

## 2021-05-05 MED ORDER — INSULIN ASPART 100 UNIT/ML IJ SOLN: 0.0000 [IU] | Freq: Three times a day (TID) | INTRAMUSCULAR | Status: DC

## 2021-05-05 MED ORDER — ADULT MULTIVITAMIN W/MINERALS CH: 1.0000 | ORAL_TABLET | Freq: Every day | ORAL | 0 refills | Status: AC

## 2021-05-05 MED ORDER — DULOXETINE HCL 20 MG PO CPEP: 20.0000 mg | ORAL_CAPSULE | Freq: Every day | ORAL | Status: DC

## 2021-05-05 MED ORDER — COVID-19 MRNA VAC-TRIS(PFIZER) 30 MCG/0.3ML IM SUSP
0.3000 mL | Freq: Once | INTRAMUSCULAR | Status: AC
  Administered 2021-05-05: 0.3 mL via INTRAMUSCULAR
  Filled 2021-05-05: qty 0.3

## 2021-05-05 MED ORDER — POTASSIUM CHLORIDE CRYS ER 20 MEQ PO TBCR: 60.0000 meq | EXTENDED_RELEASE_TABLET | Freq: Two times a day (BID) | ORAL | 0 refills | Status: DC

## 2021-05-05 MED ORDER — SODIUM CHLORIDE 0.9 % IV SOLN: 1.0000 g | INTRAVENOUS | Status: AC

## 2021-05-05 MED ORDER — GLUCERNA SHAKE PO LIQD: 237.0000 mL | Freq: Three times a day (TID) | ORAL | 0 refills | Status: AC

## 2021-05-05 MED ORDER — VANCOMYCIN HCL 1250 MG/250ML IV SOLN: 1250.0000 mg | Freq: Two times a day (BID) | INTRAVENOUS | 1 refills | Status: AC

## 2021-05-05 NOTE — Discharge Summary (Addendum)
Physician Discharge Summary  [name] ZSW:[id number] DOB: 1961/08/16 DOA: 04/27/2021  PCP: [name], MD  Admit date: 04/27/2021 Discharge date: 05/05/2021  Admitted From: Home  Disposition:  SNF   Recommendations for Outpatient Follow-up and new medication changes:  Follow up with Dr. Lawerance Bach in 7 to 10 days.  Follow up with [facility] on 06/10/21  Continue antibiotic therapy with: vancomycin and ertapenem  Duration: 6 weeks    End Date: August 22   [facility] Care Per Protocol with [employer] Use: Home health RN for IV administration and teaching, line care and labs.     Labs weekly while on IV antibiotics: _x_ CBC with differential _x_ BMP TWICE WEEKLY _x_ Vancomycin trough     _x_ Please pull PIC at completion of IV antibiotics    Fax weekly labs to [phone]   Clinic Follow Up Appt: 8/03 @ 10:15 via telephone with Dr. Daiva Eves   Home Health: na   Equipment/Devices: na    Discharge Condition: stable  CODE STATUS: full  Diet recommendation:  heart healthy and diabetic prudent   Brief/Interim Summary: Mr. Hellmer was admitted to the hospital with the working diagnosis of severe sepsis due to left diabetic foot infection, 5th metatarsal osteomyelitis, left ankle septic joint. He has declined surgery and will bed discharge on IV antibiotic therap with close follow up as outpaitent.    60 year old male past medical history for type 2 diabetes mellitus, hypertension, heart failure, alcohol abuse, obesity and obstructive sleep apnea who presented with left foot infection.  Reported chronic left foot wound that lately developed worsening edema and expanding erythema.  On the day of hospitalization he was seen at the outpatient clinic and was referred to the hospital for IV antibiotics.  On his initial physical examination his blood pressure was 147/112, heart rate 128, respiratory rate 12, temperature 98.8, oxygen saturation 93%, his lungs were clear to auscultation  bilaterally, heart S1 -S2, present, tachycardic, his abdomen was soft nontender, his left leg was edematous, positive distal lymphedema extending up to the mid leg, midfoot plantar deep ulcerated wound.   Sodium 132, potassium 3.0, chloride 92, bicarb 23, glucose 156, BUN 10, creatinine 0.78, lactic acid 4.0, white count 15.9, hemoglobin 11.6, hematocrit 34.0, platelets 154 SARS COVID-19 negative.   Urinalysis specific gravity 1.012, urine sodium less than 10, urine osmolality 379.   Left foot radiograph with considerable soft tissue swelling and plantar diabetic foot ulcer.  Mottled appearance of the base of the fifth metatarsal.   EKG 115 bpm, normal axis, normal intervals, sinus rhythm, no significant ST segment or T wave changes.   Patient placed on broad spectrum antibiotic therapy. Further workup with foot MRI showed large plantar ulceration with sinus tract underlying the 5th metatarsal. Suspected 5 th metatarsal osteomyelitis. Left ankle with signs of septic arthritis with infected tenosynovitis.    Orthopedics recommendation for below the knee amputation.    I have explained to the patient in detail his condition, severe left foot infection including bone and ankle joint involvement. Risk of worsening infection, blood stream invasion and death.   Patient continue to decline surgical intervention and will prefer IV antibiotics for 4 to 6 weeks before considering amputation.    Patient had a PICC line placed and will continue antibiotic therapy IV until 08//22 with close follow up as outpatient.   Left diabetic foot infection, cellulitis, osteomyelitis, left ankle septic joint, complicated with severe sepsis.  Endorgan damage lactic acidosis.  Present on admission.  Patient was admitted to the progressive care unit, he received intravenous fluids and broad-spectrum antibiotic therapy. Further work-up with MRI which shows signs of osteomyelitis left fifth metatarsal bone and septic joint  at the left ankle. Orthopedics recommended surgery with below the knee amputation.  His ulcer has not healed despite extensive wound care and antibiotics.  This is threatening both his limb and his life.  He has been explained this in detail and he has decided not to have surgery at this point and continue with IV antibiotics.  His discharge white cell count is 4.4 Blood cultures are no growth in 5 days.  Continue antibiotic therapy with vancomycin and ertapenem as an outpatient. Follow-up with infectious disease and orthopedics.  Palliative wound care:  Given the recommendation for surgery,  proceed with conservative care orders for Nursing to minimize the risk of infection with foot as a portal of entry. Twice daily wound care using normal saline moistened gauze and topped with a dry dressing and secured with Kerlix roll gauze and paper tape are indicated.  2.  Alcohol abuse, chronic alcoholic cirrhosis, alcohol withdrawal.  Patient was placed on lorazepam per CIWA protocol with good toleration. His withdrawal symptoms resolved rapidly. Continue with thiamine and multivitamins.  3.  Chronic diastolic heart failure.  Hypertension.  Continue blood pressure control. No signs of heart failure exacerbation. Resume furosemide.   4.  Hypothyroidism.  Continue levothyroxine.  5.  Obesity class II.  Obstructive sleep apnea.  Calculated BMI 36.9. Continue CPAP.  6.  Type 2 diabetes mellitus.  His glucose remained stable, he was placed on insulin sliding scale for glucose coverage and monitoring.  7.  Hypokalemia.  Received potassium chloride for potassium correction. At his discharge sodium 135, potassium 3.9, chloride 103, bicarb 23, glucose 81, BUN 5, creatinine 0.73.  8.  Depression.  Continue duloxetine, trazodone and hydroxyzine   Discharge Diagnoses:  Principal Problem:   Severe sepsis (HCC) Active Problems:   Hypertension   Morbid obesity (HCC)   OSA (obstructive sleep  apnea)   Hyperlipidemia   Prediabetes   Ulcer of left heel (HCC)   Anemia   Hypothyroidism   Alcoholic cirrhosis of liver without ascites (HCC)   Alcoholic peripheral neuropathy (HCC)   Osteomyelitis (HCC)   Alcohol abuse   Diabetic foot ulcer (HCC)    Discharge Instructions   Allergies as of 05/05/2021       Reactions   Claritin [loratadine] Shortness Of Breath, Anxiety        Medication List     STOP taking these medications    cloNIDine 0.1 MG tablet Commonly known as: CATAPRES   disulfiram 250 MG tablet Commonly known as: ANTABUSE       TAKE these medications    acamprosate 333 MG tablet Commonly known as: CAMPRAL Take 333 mg by mouth 2 (two) times daily.   docusate sodium 100 MG capsule Commonly known as: COLACE TAKE 1 CAPSULE (100 MG TOTAL) BY MOUTH DAILY AS NEEDED.   DULoxetine 20 MG capsule Commonly known as: CYMBALTA Take 1 capsule (20 mg total) by mouth daily.   ertapenem 1,000 mg in sodium chloride 0.9 % 100 mL Inject 1,000 mg into the vein daily. Start taking on: May 06, 2021   feeding supplement (GLUCERNA SHAKE) Liqd Take 237 mLs by mouth 3 (three) times daily between meals.   furosemide 40 MG tablet Commonly known as: LASIX TAKE 1 TABLET BY MOUTH TWICE A DAY   gabapentin 100 MG capsule Commonly  known as: NEURONTIN Take 1 capsule (100 mg total) by mouth 3 (three) times daily.   HEMP OIL-VANILLYL BUTYL ETHER EX Apply 1 application topically 2 (two) times daily as needed (arthritis pain).   hydrOXYzine 25 MG tablet Commonly known as: ATARAX/VISTARIL Take 25 mg by mouth every 6 (six) hours as needed for anxiety.   levothyroxine 50 MCG tablet Commonly known as: SYNTHROID Take 50 mcg by mouth daily before breakfast.   multivitamin with minerals Tabs tablet Take 1 tablet by mouth daily. Start taking on: May 06, 2021   naltrexone 50 MG tablet Commonly known as: DEPADE Take 50 mg by mouth daily.   ondansetron 4 MG  tablet Commonly known as: ZOFRAN Take 4 mg by mouth every 8 (eight) hours as needed for nausea or vomiting.   potassium chloride SA 20 MEQ tablet Commonly known as: Klor-Con M20 Take 3 tablets (60 mEq total) by mouth 2 (two) times daily.   traZODone 50 MG tablet Commonly known as: DESYREL TAKE 1-2 TABLETS (50-100 MG TOTAL) BY MOUTH AT BEDTIME AS NEEDED FOR SLEEP.   vancomycin 1250 MG/250ML Soln Commonly known as: VANCOREADY Inject 250 mLs (1,250 mg total) into the vein every 12 (twelve) hours. Start taking on: May 06, 2021   vitamin B-12 100 MCG tablet Commonly known as: CYANOCOBALAMIN Take 100 mcg by mouth daily.        Allergies  Allergen Reactions   Claritin [Loratadine] Shortness Of Breath and Anxiety    Consultations: Orthopedics ID    Procedures/Studies: MR FOOT LEFT W WO CONTRAST  Result Date: 04/29/2021 CLINICAL DATA:  Diabetic foot ulcer EXAM: MRI OF THE LEFT FOOT WITHOUT AND WITH CONTRAST TECHNIQUE: Multiplanar, multisequence MR imaging of the left foot was performed both before and after administration of intravenous contrast. CONTRAST:  70mL GADAVIST GADOBUTROL 1 MMOL/ML IV SOLN COMPARISON:  X-ray 04/27/2021 FINDINGS: Technical note: Examination is significantly degraded by motion artifact. Best possible images were obtained and submitted for interpretation. Large plantar foot ulceration underlying the midfoot at the level of the TMT joints measuring approximately 4.3 x 3.5 cm in diameter. There is a enhancing sinus tract with fluid extending to the underlying fifth metatarsal base. Bone marrow edema within the fifth metatarsal base with intermediate T1 marrow signal is suspicious for early acute osteomyelitis (series 6 and 8, image 7). There is mild marrow edema within the fourth metatarsal base and cuboid bone without appreciable changes of the T1 signal, nonspecific and may be reactive. Degenerative changes of the left foot and ankle with prominent hallux valgus  deformity. Large tibiotalar joint effusion with synovial enhancement. There is also fluid within the posterior subtalar joint. Large volume tenosynovial fluid collections at the posteromedial and posterolateral aspects of the ankle, which appear contiguous with the tibiotalar joint fluid. A portion of fluid extends into the more superficial soft tissues along the lateral aspect of the Achilles tendon. Collections are all rim enhancing. Marked diffuse soft tissue swelling and edema. IMPRESSION: 1. Motion degraded exam. 2. Large plantar foot ulceration underlying the level of the TMT joints measuring approximately 4.3 x 3.5 cm in diameter. There is a sinus tract with fluid extending to the underlying fifth metatarsal base. 3. Bone marrow edema within the fifth metatarsal base with intermediate T1 marrow signal is suspicious for early acute osteomyelitis. Mild marrow edema within the fourth metatarsal base and cuboid bone without appreciable T1 signal changes, nonspecific and may be reactive. 4. Large tibiotalar joint effusion with synovial enhancement. Large volume tenosynovial fluid  collections at the posteromedial and posterolateral aspects of the ankle, which appear contiguous with the tibiotalar joint fluid. A portion of fluid extends into the more superficial soft tissues along the lateral aspect of the Achilles tendon. Findings are most suggestive of septic arthritis with infected tenosynovitis. These results will be called to the ordering clinician or representative by the Radiologist Assistant, and communication documented in the PACS or [employer]. Electronically Signed   By: Duanne Guess D.O.   On: 04/29/2021 13:40   DG CHEST PORT 1 VIEW  Result Date: 04/28/2021 CLINICAL DATA:  Diabetic foot infection, history of CHF, diabetes, hypertension, former smoker EXAM: PORTABLE CHEST 1 VIEW COMPARISON:  Radiograph 11/24/2020 FINDINGS: No consolidation, features of edema, pneumothorax, or effusion.  Pulmonary vascularity is normally distributed. The cardiomediastinal contours are unremarkable for the portable technique. No acute osseous or soft tissue abnormality. IMPRESSION: No acute cardiopulmonary abnormality. Electronically Signed   By: Kreg Shropshire M.D.   On: 04/28/2021 00:24   DG Foot Complete Left  Result Date: 04/27/2021 CLINICAL DATA:  Diabetic foot ulcers with soft tissue swelling erythema, initial encounter EXAM: LEFT FOOT - COMPLETE 3+ VIEW COMPARISON:  10/15/2019 FINDINGS: Generalized soft tissue swelling is noted about the ankle and foot. Changes of prior amputation in the midportion of the third metatarsal are seen. Large soft tissue ulcer is noted along the plantar aspect of the foot near the base of the metatarsals. Mottled density is noted within the proximal aspect of the fifth metatarsal suspicious for osteomyelitis. Degenerative changes of the first MTP joint are seen. No acute fracture is noted. IMPRESSION: Considerable soft tissue swelling with plantar diabetic foot ulcer. Some mottled appearance to the base of the fifth metatarsal is seen which may represent some underlying osteomyelitis. Nonemergent MRI may be helpful for further evaluation. Electronically Signed   By: Alcide Clever M.D.   On: 04/27/2021 21:22   VAS [place] ABI WITH/WO TBI  Result Date: 04/29/2021  LOWER EXTREMITY DOPPLER STUDY Patient Name:  [name]  Date of Exam:   04/29/2021 Medical Rec #: [id number]     Accession #:    [id number] Date of Birth: 12-29-1960     Patient Gender: M Patient Age:   78Y Exam Location:  [facility] Procedure:      VAS [place] ABI WITH/WO TBI Referring Phys: [id number] ANASTASSIA DOUTOVA --------------------------------------------------------------------------------  Indications: Rest pain, and ulceration. High Risk Factors: Hypertension, hyperlipidemia.  Comparison Study: 02/17/18 prior Performing Technologist: Argentina Ponder RVS  Examination Guidelines: A complete evaluation includes at  minimum, Doppler waveform signals and systolic blood pressure reading at the level of bilateral brachial, anterior tibial, and posterior tibial arteries, when vessel segments are accessible. Bilateral testing is considered an integral part of a complete examination. Photoelectric Plethysmograph (PPG) waveforms and toe systolic pressure readings are included as required and additional duplex testing as needed. Limited examinations for reoccurring indications may be performed as noted.  ABI Findings: +--------+------------------+-----+---------+--------+ Right   Rt Pressure (mmHg)IndexWaveform Comment  +--------+------------------+-----+---------+--------+ [id number]                    triphasic         +--------+------------------+-----+---------+--------+ PTA     158               1.05 triphasic         +--------+------------------+-----+---------+--------+ DP      149               0.99 triphasic         +--------+------------------+-----+---------+--------+ +--------+------------------+-----+---------+-------+  Left    Lt Pressure (mmHg)IndexWaveform Comment +--------+------------------+-----+---------+-------+ [id number]                    triphasic        +--------+------------------+-----+---------+-------+ PTA     149               0.99 triphasic        +--------+------------------+-----+---------+-------+ DP      141               0.94 triphasic        +--------+------------------+-----+---------+-------+ +-------+-----------+-----------+------------+------------+ ABI/TBIToday's ABIToday's TBIPrevious ABIPrevious TBI +-------+-----------+-----------+------------+------------+ Right  1.05                                           +-------+-----------+-----------+------------+------------+ Left   0.99                                           +-------+-----------+-----------+------------+------------+  Summary: Right: Resting right  ankle-brachial index is within normal range. No evidence of significant right lower extremity arterial disease. Left: Resting left ankle-brachial index is within normal range. No evidence of significant left lower extremity arterial disease.  *See table(s) above for measurements and observations.  Electronically signed by Heath Lark on 04/29/2021 at 7:22:34 PM.    Final    VAS [place] LOWER EXTREMITY VENOUS (DVT)  Result Date: 04/28/2021  Lower Venous DVT Study Patient Name:  [name]  Date of Exam:   04/28/2021 Medical Rec #: [id number]     Accession #:    [id number] Date of Birth: 09/18/1961     Patient Gender: M Patient Age:   59Y Exam [facility]  [facility] Procedure:      VAS [place] LOWER EXTREMITY VENOUS (DVT) Referring Phys: [id number] ANASTASSIA DOUTOVA --------------------------------------------------------------------------------  Indications: Edema, and Erythema.  Limitations: Poor ultrasound/tissue interface, body habitus and open wound. Comparison Study: 01/29/2019 negative lower extremity venous duplex Performing Technologist: Gertie Fey MHA, RDMS, RVT, RDCS  Examination Guidelines: A complete evaluation includes B-mode imaging, spectral Doppler, color Doppler, and power Doppler as needed of all accessible portions of each vessel. Bilateral testing is considered an integral part of a complete examination. Limited examinations for reoccurring indications may be performed as noted. The reflux portion of the exam is performed with the patient in reverse Trendelenburg.  +-----+---------------+---------+-----------+----------+--------------+ RIGHTCompressibilityPhasicitySpontaneityPropertiesThrombus Aging +-----+---------------+---------+-----------+----------+--------------+ CFV  Full           Yes      Yes                                 +-----+---------------+---------+-----------+----------+--------------+    +---------+---------------+---------+-----------+----------+--------------+ LEFT     CompressibilityPhasicitySpontaneityPropertiesThrombus Aging +---------+---------------+---------+-----------+----------+--------------+ CFV      Full           Yes      Yes                                 +---------+---------------+---------+-----------+----------+--------------+ SFJ      Full                                                        +---------+---------------+---------+-----------+----------+--------------+  FV Prox  Full                                                        +---------+---------------+---------+-----------+----------+--------------+ FV Mid   Full                                                        +---------+---------------+---------+-----------+----------+--------------+ FV DistalFull                                                        +---------+---------------+---------+-----------+----------+--------------+ PFV      Full                                                        +---------+---------------+---------+-----------+----------+--------------+ POP      Full           Yes      Yes                                 +---------+---------------+---------+-----------+----------+--------------+ PTV      Full                                                        +---------+---------------+---------+-----------+----------+--------------+ PERO     Full                                                        +---------+---------------+---------+-----------+----------+--------------+     Summary: RIGHT: - No evidence of common femoral vein obstruction.  LEFT: - There is no evidence of deep vein thrombosis in the lower extremity.  - No cystic structure found in the popliteal fossa.  *See table(s) above for measurements and observations. Electronically signed by Fabienne Brunsharles Fields MD on 04/28/2021 at 5:10:10 PM.    Final    US EKG SITE  RITE  Result Date: 05/04/2021 If Site Rite image not attached, placement could not be confirmed due to current cardiac rhythm.    Procedures: PICC line    Subjective: Patient is feeling well, no nausea or vomiting, no chest pain or dyspnea.   Discharge Exam: Vitals:   05/05/21 0800 05/05/21 1143  BP: 135/80 (!) 143/74  Pulse: 84 90  Resp: 18 19  Temp: 98.3 F (36.8 C) 97.9 F (36.6 C)  SpO2: 97% 96%   Vitals:   05/04/21 2358 05/05/21 0312 05/05/21 0800 05/05/21 1143  BP: 140/81 (!) 143/66 135/80 (!) 143/74  Pulse: 82 77  84 90  Resp: 16  18 19   Temp: 98 F (36.7 C) 98 F (36.7 C) 98.3 F (36.8 C) 97.9 F (36.6 C)  TempSrc: Oral Oral Oral Oral  SpO2: 96% 95% 97% 96%  Weight:      Height:        General: Not in pain or dyspnea.  Neurology: Awake and alert, non focal  E ENT: no pallor, no icterus, oral mucosa moist Cardiovascular: No JVD. S1-S2 present, rhythmic, no gallops, rubs, or murmurs. Left lower extremity edema, dressing in place. . Pulmonary: positive breath sounds bilaterally, with no wheezing, rhonchi or rales. Gastrointestinal. Abdomen protuberant, soft and non tender Skin. No rashes Musculoskeletal: no joint deformities   The results of significant diagnostics from this hospitalization (including imaging, microbiology, ancillary and laboratory) are listed below for reference.     Microbiology: Recent Results (from the past 240 hour(s))  Blood Cultures x 2 sites     Status: None   Collection Time: 04/27/21 10:24 PM   Specimen: BLOOD  Result Value Ref Range Status   Specimen Description   Final    BLOOD LEFT ARM Performed at [facility], [address], [place], [place]    Special Requests   Final    BOTTLES DRAWN AEROBIC AND ANAEROBIC Blood Culture results may not be optimal due to an inadequate volume of blood received in culture bottles Performed at [facility], [address], [place],  [place]    Culture   Final    NO GROWTH 5 DAYS Performed at [facility], [address], [place], [place]    Report Status 05/03/2021 FINAL  Final  Blood Cultures x 2 sites     Status: None   Collection Time: 04/27/21 10:53 PM   Specimen: BLOOD  Result Value Ref Range Status   Specimen Description   Final    BLOOD FINGER Performed at [facility], [address], [place], [place]    Special Requests   Final    BOTTLES DRAWN AEROBIC AND ANAEROBIC Blood Culture adequate volume Performed at [facility], [address] [place], [place]    Culture   Final    NO GROWTH 5 DAYS Performed at [facility], [address], [place], [place]    Report Status 05/03/2021 FINAL  Final  Resp Panel by RT-PCR (Flu A&B, Covid) Nasopharyngeal Swab     Status: None   Collection Time: 04/28/21 12:00 AM   Specimen: Nasopharyngeal Swab; Nasopharyngeal(NP) swabs in vial transport medium  Result Value Ref Range Status   SARS Coronavirus 2 by RT PCR NEGATIVE NEGATIVE Final    Comment: (NOTE) SARS-CoV-2 target nucleic acids are NOT DETECTED.  The SARS-CoV-2 RNA is generally detectable in upper respiratory specimens during the acute phase of infection. The lowest concentration of SARS-CoV-2 viral copies this assay can detect is 138 copies/mL. A negative result does not preclude SARS-Cov-2 infection and should not be used as the sole basis for treatment or other patient management decisions. A negative result may occur with  improper specimen collection/handling, submission of specimen other than nasopharyngeal swab, presence of viral mutation(s) within the areas targeted by this assay, and inadequate number of viral copies(<138 copies/mL). A negative result must be combined with clinical observations, patient history, and epidemiological information. The expected result is Negative.  Fact Sheet for Patients:   06/29/21  Fact Sheet for Healthcare  Providers:  SeriousBroker.it  This test is no t yet approved or cleared by the [place] and  has been authorized for detection and/or diagnosis of SARS-CoV-2 by FDA under an Emergency Use Authorization (EUA). This EUA will remain  in effect (meaning this test can be used) for the duration of the COVID-19 declaration under Section 564(b)(1) of the Act, 21 U.S.C.section 360bbb-3(b)(1), unless the authorization is terminated  or revoked sooner.       Influenza A by PCR NEGATIVE NEGATIVE Final   Influenza B by PCR NEGATIVE NEGATIVE Final    Comment: (NOTE) The Xpert Xpress SARS-CoV-2/FLU/RSV plus assay is intended as an aid in the diagnosis of influenza from Nasopharyngeal swab specimens and should not be used as a sole basis for treatment. Nasal washings and aspirates are unacceptable for Xpert Xpress SARS-CoV-2/FLU/RSV testing.  Fact Sheet for Patients: BloggerCourse.com  Fact Sheet for Healthcare Providers: SeriousBroker.it  This test is not yet approved or cleared by the [place] FDA and has been authorized for detection and/or diagnosis of SARS-CoV-2 by FDA under an Emergency Use Authorization (EUA). This EUA will remain in effect (meaning this test can be used) for the duration of the COVID-19 declaration under Section 564(b)(1) of the Act, 21 U.S.C. section 360bbb-3(b)(1), unless the authorization is terminated or revoked.  Performed at [facility], [address], [place], [place] [address]      Labs: BNP (last 3 results) Recent Labs    05/29/20 0018  BNP 36.6   Basic Metabolic Panel: Recent Labs  Lab 04/29/21 0149 04/30/21 0440 05/01/21 0119 05/02/21 0125 05/03/21 0509 05/04/21 0500  NA 131* 132* 134* 136 134* 135  K 3.7 3.0* 3.3* 3.8 3.8 3.9  CL 99 99 104 107 104 103  CO2  21* 25 25 21* 23 23  GLUCOSE 96 87 84 99 88 81  BUN 5*  CREATININE 0.75 0.77 0.67 0.63 0.69 0.73  CALCIUM 7.7* 8.2* 8.5* 8.5* 8.7* 9.2  MG 1.9  --   --  1.7  --   --    Liver Function Tests: No results for input(s): AST, ALT, ALKPHOS, BILITOT, PROT, ALBUMIN in the last 168 hours. No results for input(s): LIPASE, AMYLASE in the last 168 hours. No results for input(s): AMMONIA in the last 168 hours. CBC: Recent Labs  Lab 04/30/21 0440 05/01/21 0119 05/02/21 0125 05/03/21 0509 05/04/21 0500  WBC 5.7 3.9* 3.5* 3.6* 4.4  NEUTROABS 4.2 2.7 2.1 1.9 2.5  HGB 9.8* 10.0* 9.9* 10.9* 10.5*  HCT 28.9* 29.6* 30.0* 32.9* 32.3*  MCV 96.7 96.7 99.0 97.1 97.9  PLT 140* 158 173 243 264   Cardiac Enzymes: No results for input(s): CKTOTAL, CKMB, CKMBINDEX, TROPONINI in the last 168 hours. BNP: Invalid input(s): POCBNP CBG: Recent Labs  Lab 05/04/21 2350 05/05/21 0313 05/05/21 0806 05/05/21 1156 05/05/21 1550  GLUCAP 90 82 83 119* 114*   D-Dimer No results for input(s): DDIMER in the last 72 hours. Hgb A1c No results for input(s): HGBA1C in the last 72 hours. Lipid Profile No results for input(s): CHOL, HDL, LDLCALC, TRIG, CHOLHDL, LDLDIRECT in the last 72 hours. Thyroid function studies No results for input(s): TSH, T4TOTAL, T3FREE, THYROIDAB in the last 72 hours.  Invalid input(s): FREET3 Anemia work up No results for input(s): VITAMINB12, FOLATE, FERRITIN, TIBC, IRON, RETICCTPCT in the last 72 hours. Urinalysis    Component Value Date/Time   COLORURINE AMBER (A) 04/28/2021 0230   APPEARANCEUR CLEAR  04/28/2021 0230   LABSPEC 1.012 04/28/2021 0230   PHURINE 6.0 04/28/2021 0230   GLUCOSEU NEGATIVE 04/28/2021 0230   HGBUR SMALL (A) 04/28/2021 0230   BILIRUBINUR NEGATIVE 04/28/2021 0230   KETONESUR NEGATIVE 04/28/2021 0230   PROTEINUR NEGATIVE 04/28/2021 0230   NITRITE NEGATIVE 04/28/2021 0230   LEUKOCYTESUR NEGATIVE 04/28/2021 0230   Sepsis Labs Invalid  input(s): PROCALCITONIN,  WBC,  LACTICIDVEN Microbiology Recent Results (from the past 240 hour(s))  Blood Cultures x 2 sites     Status: None   Collection Time: 04/27/21 10:24 PM   Specimen: BLOOD  Result Value Ref Range Status   Specimen Description   Final    BLOOD LEFT ARM Performed at [facility], [address] [place], [place] [address]    Special Requests   Final    BOTTLES DRAWN AEROBIC AND ANAEROBIC Blood Culture results may not be optimal due to an inadequate volume of blood received in culture bottles Performed at [facility], [address] [place], [place] [address]    Culture   Final    NO GROWTH 5 DAYS Performed at [facility], [address], [place], [place] [address]    Report Status 05/03/2021 FINAL  Final  Blood Cultures x 2 sites     Status: None   Collection Time: 04/27/21 10:53 PM   Specimen: BLOOD  Result Value Ref Range Status   Specimen Description   Final    BLOOD FINGER Performed at [facility], [address] [place], [place] [address]    Special Requests   Final    BOTTLES DRAWN AEROBIC AND ANAEROBIC Blood Culture adequate volume Performed at [facility], [address] [place], [place] [address]    Culture   Final    NO GROWTH 5 DAYS Performed at [facility], [address], [place], [place] [address]    Report Status 05/03/2021 FINAL  Final  Resp Panel by RT-PCR (Flu A&B, Covid) Nasopharyngeal Swab     Status: None   Collection Time: 04/28/21 12:00 AM   Specimen: Nasopharyngeal Swab; Nasopharyngeal(NP) swabs in vial transport medium  Result Value Ref Range Status   SARS Coronavirus 2 by RT PCR NEGATIVE NEGATIVE Final    Comment: (NOTE) SARS-CoV-2 target nucleic acids are NOT DETECTED.  The SARS-CoV-2 RNA is generally detectable in upper respiratory specimens during the acute phase of infection. The lowest concentration of SARS-CoV-2 viral  copies this assay can detect is 138 copies/mL. A negative result does not preclude SARS-Cov-2 infection and should not be used as the sole basis for treatment or other patient management decisions. A negative result may occur with  improper specimen collection/handling, submission of specimen other than nasopharyngeal swab, presence of viral mutation(s) within the areas targeted by this assay, and inadequate number of viral copies(<138 copies/mL). A negative result must be combined with clinical observations, patient history, and epidemiological information. The expected result is Negative.  Fact Sheet for Patients:  BloggerCourse.com  Fact Sheet for Healthcare Providers:  SeriousBroker.it  This test is no t yet approved or cleared by the [place] FDA and  has been authorized for detection and/or diagnosis of SARS-CoV-2 by FDA under an Emergency Use Authorization (EUA). This EUA will remain  in effect (meaning this test can be used) for the duration of the COVID-19 declaration under Section 564(b)(1) of the Act, 21 U.S.C.section 360bbb-3(b)(1), unless the authorization is terminated  or revoked sooner.  Influenza A by PCR NEGATIVE NEGATIVE Final   Influenza B by PCR NEGATIVE NEGATIVE Final    Comment: (NOTE) The Xpert Xpress SARS-CoV-2/FLU/RSV plus assay is intended as an aid in the diagnosis of influenza from Nasopharyngeal swab specimens and should not be used as a sole basis for treatment. Nasal washings and aspirates are unacceptable for Xpert Xpress SARS-CoV-2/FLU/RSV testing.  Fact Sheet for Patients: BloggerCourse.com  Fact Sheet for Healthcare Providers: SeriousBroker.it  This test is not yet approved or cleared by the [place] FDA and has been authorized for detection and/or diagnosis of SARS-CoV-2 by FDA under an Emergency Use Authorization (EUA). This  EUA will remain in effect (meaning this test can be used) for the duration of the COVID-19 declaration under Section 564(b)(1) of the Act, 21 U.S.C. section 360bbb-3(b)(1), unless the authorization is terminated or revoked.  Performed at [facility], [address], [place], [place] [address]      Time coordinating discharge: 45 minutes  SIGNED:   Coralie Keens, MD  [facility] 05/05/2021, 4:23 PM

## 2021-05-05 NOTE — Progress Notes (Signed)
Physical Therapy Treatment Patient Details Name: [name] MRN: [id number] DOB: June 16, 1961 Today's Date: 05/05/2021    History of Present Illness [name] is a 60 y.o. male admitted for working diagnosis of severe sepsis due to L diabetic foot infection/ulcer. MRI shows possible early osteomyelitis, septic joint arthritis with infected tenosynovitis. Awaiting further orthopedic recommendations. PMH: HTN, obesity, HLD, prediabetes, alcoholic cirrhosis, alcohol abuse.    PT Comments    Pt received in supine, agreeable to therapy session and with good participation in slide board transfer training and seated exercises. Pt needing mod cues and min guard to minA for lateral scoot transfers, pt girlfriend also present to learn mobility techniques and encouraging. Fair carryover of instruction from previous session but does need safety cues still. Plan to bring bari wheelchair next session for UE exercises/hallway wheelchair mobility. Pt continues to benefit from PT services to progress toward functional mobility goals.    Follow Up Recommendations  SNF;Supervision for mobility/OOB;Other (comment) (pt now interested in SNF, if pt instead chooses home then HHPT and max services including aide and HHRN if possible)     Equipment Recommendations  Wheelchair (measurements PT);Wheelchair cushion (measurements PT);Other (comment) (slide board, ramp (needs to be installed, pt has 3 STE))    Recommendations for Other Services       Precautions / Restrictions Precautions Precautions: Fall Precaution Comments: L heel non-healing ulcer with drainage Restrictions Weight Bearing Restrictions: Yes LLE Weight Bearing: Non weight bearing    Mobility  Bed Mobility Overal bed mobility: Modified Independent             General bed mobility comments: Flat HOB, no rail, increased time/effort    Transfers Overall transfer level: Needs assistance Equipment used: Sliding board Transfers:  Lateral/Scoot Transfers          Lateral/Scoot Transfers: With slide board;Min assist General transfer comment: SB for bed>WC, then WC>drop arm chair with minA and assist only for SB placement and to keep LLE elevated for NWB, mod cues for technique still required.  Ambulation/Gait                 Stairs             Wheelchair Mobility    Modified Rankin (Stroke Patients Only)       Balance Overall balance assessment: Needs assistance Sitting-balance support: No upper extremity supported;Feet supported Sitting balance-Leahy Scale: Good Sitting balance - Comments: static sitting and weight shifting without LOB for SB placement                                    Cognition Arousal/Alertness: Awake/alert Behavior During Therapy: WFL for tasks assessed/performed Overall Cognitive Status: Within Functional Limits for tasks assessed                                 General Comments: WFL cognitively for basic tasks/conversation, pt with somewhat flat affect but participatory for all tasks given.      Exercises Other Exercises Other Exercises: chair push-ups UE only (legs reclined) x5 reps Other Exercises: reclined BLE AROM: ankle pumps, SLR, heel slides x5-10 reps ea    General Comments General comments (skin integrity, edema, etc.): VSS on RA      Pertinent Vitals/Pain Pain Assessment: Faces Faces Pain Scale: Hurts a little bit Pain Location: L anterior thigh, sciatic nerve pain per  pt Pain Descriptors / Indicators: Sore;Tingling ("electrical") Pain Intervention(s): Monitored during session;Repositioned    Home Living                      Prior Function            PT Goals (current goals can now be found in the care plan section) Acute Rehab PT Goals Patient Stated Goal: avoid surgery, for L foot to heal PT Goal Formulation: With patient Time For Goal Achievement: 05/14/21 Progress towards PT goals: Progressing  toward goals    Frequency    Min 3X/week      PT Plan Current plan remains appropriate    Co-evaluation              AM-PAC PT "6 Clicks" Mobility   Outcome Measure  Help needed turning from your back to your side while in a flat bed without using bedrails?: None Help needed moving from lying on your back to sitting on the side of a flat bed without using bedrails?: None Help needed moving to and from a bed to a chair (including a wheelchair)?: A Little Help needed standing up from a chair using your arms (e.g., wheelchair or bedside chair)?: A Lot Help needed to walk in hospital room?: Total Help needed climbing 3-5 steps with a railing? : Total 6 Click Score: 15    End of Session Equipment Utilized During Treatment: Gait belt Activity Tolerance: Patient tolerated treatment well Patient left: with call bell/phone within reach;in chair;with chair alarm set Nurse Communication: Mobility status;Weight bearing status;Other (comment) (use slide board) PT Visit Diagnosis: Other abnormalities of gait and mobility (R26.89);Muscle weakness (generalized) (M62.81)     Time: 1610-9604 PT Time Calculation (min) (ACUTE ONLY): 35 min  Charges:  $Therapeutic Exercise: 8-22 mins $Therapeutic Activity: 8-22 mins                     Tykisha Areola P., PTA Acute Rehabilitation Services Pager: [phone] Office: [phone]    [name] Palms 05/05/2021, 5:27 PM

## 2021-05-05 NOTE — TOC Progression Note (Signed)
Transition of Care [facility]) - Progression Note    Patient Details  Name: Zacary Bauer MRN: [id number] Date of Birth: Nov 19, 1960  Transition of Care [facility]) CM/SW [place], [place] Phone Number: 05/05/2021, 4:47 PM  Clinical Narrative:     CSW met with patient- he requested to call his significant other Missy Elgie Congo @ [phone] states she has discuss PT recommendations w/patient and they are both agreeable to short term rehab at [facility]. CSW explained his only bed offer is [facility]. Both are agreeable to placement at [facility].  CSW confirmed with [facility] bed offer. SNF requested patient receive booster shot before d/c to SNF. Patient states he is agreeable to receive booster shot.   RN & MD updated-   Thurmond Butts, MSW, LCSW Clinical Social Worker    Expected Discharge Plan: Skilled Nursing Facility Barriers to Discharge: Continued Medical Work up, SNF Pending bed offer  Expected Discharge Plan and Services Expected Discharge Plan: [place] In-house Referral: Clinical Social Work Discharge Planning Services: CM Consult Post Acute Care Choice: [place], [place], [place] Living arrangements for the past 2 months: Single Family Home                           HH Arranged: IV Antibiotics HH Agency: [facility] Date HH Agency Contacted: 05/04/21       Social Determinants of Health (SDOH) Interventions    Readmission Risk Interventions Readmission Risk Prevention Plan 03/04/2020  Transportation Screening Complete  PCP or Specialist Appt within 3-5 Days Complete  HRI or [place] Complete  Social Work Consult for [place] Planning/Counseling Complete  Palliative Care Screening Complete  Medication Review Press photographer) Complete  Some recent data might be hidden

## 2021-05-05 NOTE — TOC Progression Note (Signed)
Transition of Care [facility]) - Progression Note    Patient Details  Name: [name] MRN: [id number] Date of Birth: [birth year]  Transition of Care [facility]) CM/SW Contact  Eduard Roux, [place] Phone Number: 05/05/2021, 12:05 PM  Clinical Narrative:     CSW called patient -informed he only has one bed offer , which is [facility]. Patient states he will review facility and he has meeting today at 3pm. CSW will follow up after meeting.   Antony Blackbird, MSW, LCSW Clinical Social Worker     Expected Discharge Plan: Skilled Nursing Facility Barriers to Discharge: Continued Medical Work up, SNF Pending bed offer  Expected Discharge Plan and Services Expected Discharge Plan: Skilled Nursing Facility In-house Referral: Clinical Social Work Discharge Planning Services: CM Consult Post Acute Care Choice: Home Health, Durable Medical Equipment, Skilled Nursing Facility Living arrangements for the past 2 months: Single Family Home                           HH Arranged: IV Antibiotics HH Agency: [facility] Date HH Agency Contacted: 05/04/21       Social Determinants of Health (SDOH) Interventions    Readmission Risk Interventions Readmission Risk Prevention Plan 03/04/2020  Transportation Screening Complete  PCP or Specialist Appt within 3-5 Days Complete  HRI or Home Care Consult Complete  Social Work Consult for Recovery Care Planning/Counseling Complete  Palliative Care Screening Complete  Medication Review Oceanographer) Complete  Some recent data might be hidden

## 2021-05-05 NOTE — Progress Notes (Signed)
This chaplain responded spiritual care consult for notary services.  The chaplain understands the Pt. Advance Directive is filled out and he is waiting on a notary to complete the AD.   The chaplain informed the Pt. A notary will not be available this week. The chaplain informed the Pt. of how to obtain the services outside the hospital and scan into EMR at a later date.  The Pt. was appreciative of the visit.

## 2021-05-06 DIAGNOSIS — A419 Sepsis, unspecified organism: Secondary | ICD-10-CM | POA: Diagnosis not present

## 2021-05-06 DIAGNOSIS — R6520 Severe sepsis without septic shock: Secondary | ICD-10-CM | POA: Diagnosis not present

## 2021-05-06 LAB — GLUCOSE, CAPILLARY
Glucose-Capillary: 103 mg/dL — ABNORMAL HIGH (ref 70–99)
Glucose-Capillary: 95 mg/dL (ref 70–99)
Glucose-Capillary: 111 mg/dL — ABNORMAL HIGH (ref 70–99)

## 2021-05-06 MED ORDER — HEPARIN SOD (PORK) LOCK FLUSH 100 UNIT/ML IV SOLN
250.0000 [IU] | INTRAVENOUS | Status: AC | PRN
  Administered 2021-05-06: 250 [IU]
  Filled 2021-05-06: qty 2.5

## 2021-05-06 NOTE — Progress Notes (Signed)
Report called to [facility]. PICC line heparinized. All belongings sent with [facility]AR.  Versie Starks, RN

## 2021-05-06 NOTE — Progress Notes (Signed)
Occupational Therapy Treatment Patient Details Name: [name] MRN: [id number] DOB: Mar 02, 1961 Today's Date: 05/06/2021    History of present illness [name] is a 60 y.o. male admitted for working diagnosis of severe sepsis due to L diabetic foot infection/ulcer. MRI shows possible early osteomyelitis, septic joint arthritis with infected tenosynovitis. Awaiting further orthopedic recommendations. PMH: HTN, obesity, HLD, prediabetes, alcoholic cirrhosis, alcohol abuse.   OT comments  Pt admitted with above. Pt at this time was able to complete supine to sitting with modified independent, lateral scooting at EOB with min guard and sit to stand with min guard to cue pt about LLE NWB status. Pt currently with functional limitations due to the deficits listed below (see OT Problem List).  Pt will benefit from skilled OT to increase their safety and independence with ADL and functional mobility for ADL to facilitate discharge to venue listed below.    Follow Up Recommendations  SNF;Supervision - Intermittent    Equipment Recommendations  3 in 1 bedside commode;Wheelchair (measurements OT);Wheelchair cushion (measurements OT);Other (comment)    Recommendations for Other Services      Precautions / Restrictions Precautions Precautions: Fall Precaution Comments: L heel non-healing ulcer with drainage Restrictions Weight Bearing Restrictions: Yes RLE Weight Bearing: Weight bearing as tolerated LLE Weight Bearing: Non weight bearing       Mobility Bed Mobility Overal bed mobility: Modified Independent             General bed mobility comments: Flat HOB, no rail, increased time/effort    Transfers Overall transfer level: Needs assistance Equipment used: Rolling walker (2 wheeled) Transfers: Lateral/Scoot Transfers Sit to Stand: Min guard;From elevated surface        Lateral/Scoot Transfers: Min guard General transfer comment: Pt was able to slide to [facility] and sit to  stands to walker but cues on how to ensure NWB to LLE    Balance Overall balance assessment: Needs assistance Sitting-balance support: No upper extremity supported;Feet supported Sitting balance-Leahy Scale: Good                                     ADL either performed or assessed with clinical judgement   ADL Overall ADL's : Needs assistance/impaired Eating/Feeding: Independent;Sitting   Grooming: Independent;Sitting   Upper Body Bathing: Independent;Sitting   Lower Body Bathing: Minimal assistance;Sitting/lateral leans   Upper Body Dressing : Independent;Sitting   Lower Body Dressing: Minimal assistance;Sitting/lateral leans;Bed level;Cueing for safety;Cueing for sequencing                       Vision   Vision Assessment?: No apparent visual deficits   Perception     Praxis      Cognition Arousal/Alertness: Awake/alert Behavior During Therapy: WFL for tasks assessed/performed Overall Cognitive Status: Within Functional Limits for tasks assessed                                          Exercises     Shoulder Instructions       General Comments      Pertinent Vitals/ Pain       Pain Assessment: Faces Faces Pain Scale: Hurts a little bit Pain Location: L anterior thigh, sciatic nerve pain per pt Pain Descriptors / Indicators: Sore;Tingling Pain Intervention(s): Monitored during session  Home Living  Prior Functioning/Environment              Frequency  Min 2X/week        Progress Toward Goals  OT Goals(current goals can now be found in the care plan section)  Progress towards OT goals: Progressing toward goals  Acute Rehab OT Goals Patient Stated Goal: to go to rehab soon OT Goal Formulation: With patient Time For Goal Achievement: 05/14/21 Potential to Achieve Goals: Good ADL Goals Pt Will Perform Lower Body Bathing: sitting/lateral  leans;sit to/from stand;with set-up Pt Will Perform Lower Body Dressing: with set-up;sitting/lateral leans;sit to/from stand Pt Will Transfer to Toilet: with set-up;stand pivot transfer;squat pivot transfer;bedside commode  Plan Discharge plan needs to be updated    Co-evaluation                 AM-PAC OT "6 Clicks" Daily Activity     Outcome Measure   Help from another person eating meals?: None Help from another person taking care of personal grooming?: None Help from another person toileting, which includes using toliet, bedpan, or urinal?: A Little Help from another person bathing (including washing, rinsing, drying)?: A Lot Help from another person to put on and taking off regular upper body clothing?: None Help from another person to put on and taking off regular lower body clothing?: A Little 6 Click Score: 20    End of Session Equipment Utilized During Treatment: Gait belt;Rolling walker  OT Visit Diagnosis: Other abnormalities of gait and mobility (R26.89);Pain Pain - Right/Left: Left Pain - part of body: Hip   Activity Tolerance Patient tolerated treatment well   Patient Left in bed;with call bell/phone within reach   Nurse Communication          Time: 0737-1062 OT Time Calculation (min): 23 min  Charges: OT General Charges $OT Visit: 1 Visit OT Treatments $Self Care/Home Management : 23-37 mins  Alphia Moh OTR/L  Acute Rehab Services  [phone] office number [phone] pager number     Alphia Moh 05/06/2021, 10:49 AM

## 2021-05-06 NOTE — TOC Transition Note (Signed)
Transition of Care [facility]) - CM/SW Discharge Note   Patient Details  Name: [name] MRN: [id number] Date of Birth: 09-11-1961  Transition of Care [facility]) CM/SW Contact:  Eduard Roux, LCSW Phone Number: 05/06/2021, 1:32 PM   Clinical Narrative:     Patient will Discharge to: [facility]  Discharge Date: 05/06/2021 Family Notified: [name],friend Transport GB:TDVV @ 3pm  Per MD patient is ready for discharge. RN, patient, and facility notified of discharge. Discharge Summary sent to facility. RN given number for [phone], Room 130. Ambulance transport requested for patient.   Clinical Social Worker signing off.  Antony Blackbird, MSW, LCSW Clinical Social Worker    Final next level of care: Skilled Nursing Facility Barriers to Discharge: Barriers Resolved   Patient Goals and CMS Choice Patient states their goals for this hospitalization and ongoing recovery are:: need to go to rehab for IV abx CMS Medicare.gov Compare Post Acute Care list provided to:: Patient Choice offered to / list presented to : Patient  Discharge Placement              Patient chooses bed at: [facility] Patient to be transferred to facility by: [facility] Name of family member notified: Efraim Kaufmann, friend Patient and family notified of of transfer: 05/06/21  Discharge Plan and Services In-house Referral: Clinical Social Work Discharge Planning Services: CM Consult Post Acute Care Choice: Home Health, Durable Medical Equipment, Skilled Nursing Facility                    HH Arranged: IV Antibiotics HH Agency: [facility] Date HH Agency Contacted: 05/04/21      Social Determinants of Health (SDOH) Interventions     Readmission Risk Interventions Readmission Risk Prevention Plan 03/04/2020  Transportation Screening Complete  PCP or Specialist Appt within 3-5 Days Complete  HRI or Home Care Consult Complete  Social Work Consult for Recovery Care  Planning/Counseling Complete  Palliative Care Screening Complete  Medication Review Oceanographer) Complete  Some recent data might be hidden

## 2021-05-06 NOTE — Progress Notes (Signed)
Pharmacy Antibiotic Note  [name] is a 60 y.o. male admitted on 04/27/2021 with left swollen foot.  Continues on Vancomycin  and Cefepime for diabetic foot infection/possible osteomyelitis.   Vancomycin levels drawn today - > VP 34 and VT 14 to give AUC of 603. Renal function stable, given osteo/septic joint, will continue at upper end of AUC since pt wanting OPAT vs. amputation.  Pt has declined amputation so he will be vanc/ertapenem until 8/22 per ID. He will be discharged to SNF. Renal function stable.  Plan: Continue Vancomycin 1250 mg IV Q 12 hours Cefepime 2gm IV q8h Please get levels 7/22   Height: 6\' 1"  (185.4 cm) Weight: 127 kg (280 lb) IBW/kg (Calculated) : 79.9  Temp (24hrs), Avg:98.1 F (36.7 C), Min:97.9 F (36.6 C), Max:98.8 F (37.1 C)  Recent Labs  Lab 04/30/21 0440 04/30/21 1227 05/01/21 0119 05/02/21 0125 05/03/21 0509 05/03/21 1324 05/04/21 0500  WBC 5.7  --  3.9* 3.5* 3.6*  --  4.4  CREATININE 0.77  --  0.67 0.63 0.69  --  0.73  VANCOTROUGH  --  17  --   --   --  14*  --   VANCOPEAK 35  --   --   --  34  --   --      Estimated Creatinine Clearance: 138.8 mL/min (by C-G formula based on SCr of 0.73 mg/dL).    05/06/21, PharmD, BCIDP, AAHIVP, CPP Infectious Disease Pharmacist 05/06/2021 8:20 AM

## 2021-05-06 NOTE — Discharge Summary (Signed)
Physician Discharge Summary  [name] ZOX:[id number] DOB: October 26, 1960 DOA: 04/27/2021  PCP: [name], MD  Admit date: 04/27/2021 Discharge date: 05/06/2021  Admitted From: Home  Disposition:  SNF   Recommendations for Outpatient Follow-up and new medication changes:  Follow up with Dr. Lawerance Bach in 7 to 10 days.  Follow up with [facility] on 06/10/21  Continue antibiotic therapy with: vancomycin and ertapenem  Duration: 6 weeks    End Date: August 22   [facility] Care Per Protocol with [employer] Use: Home health RN for IV administration and teaching, line care and labs.     Labs weekly while on IV antibiotics: _x_ CBC with differential _x_ BMP TWICE WEEKLY _x_ Vancomycin trough     _x_ Please pull PIC at completion of IV antibiotics    Fax weekly labs to [phone]   Clinic Follow Up Appt: 8/03 @ 10:15 via telephone with Dr. Daiva Eves   Home Health: na   Equipment/Devices: na    Discharge Condition: stable  CODE STATUS: full  Diet recommendation:  heart healthy and diabetic prudent   Brief/Interim Summary: [name] was admitted to the hospital with the working diagnosis of severe sepsis due to left diabetic foot infection, 5th metatarsal osteomyelitis, left ankle septic joint. He has declined surgery and will bed discharge on IV antibiotic therap with close follow up as outpaitent.    60 year old male past medical history for type 2 diabetes mellitus, hypertension, heart failure, alcohol abuse, obesity and obstructive sleep apnea who presented with left foot infection.  Reported chronic left foot wound that lately developed worsening edema and expanding erythema.  On the day of hospitalization he was seen at the outpatient clinic and was referred to the hospital for IV antibiotics.  On his initial physical examination his blood pressure was 147/112, heart rate 128, respiratory rate 12, temperature 98.8, oxygen saturation 93%, his lungs were clear to auscultation  bilaterally, heart S1 -S2, present, tachycardic, his abdomen was soft nontender, his left leg was edematous, positive distal lymphedema extending up to the mid leg, midfoot plantar deep ulcerated wound.   Sodium 132, potassium 3.0, chloride 92, bicarb 23, glucose 156, BUN 10, creatinine 0.78, lactic acid 4.0, white count 15.9, hemoglobin 11.6, hematocrit 34.0, platelets 154 SARS COVID-19 negative.   Urinalysis specific gravity 1.012, urine sodium less than 10, urine osmolality 379.   Left foot radiograph with considerable soft tissue swelling and plantar diabetic foot ulcer.  Mottled appearance of the base of the fifth metatarsal.   EKG 115 bpm, normal axis, normal intervals, sinus rhythm, no significant ST segment or T wave changes.   Patient placed on broad spectrum antibiotic therapy. Further workup with foot MRI showed large plantar ulceration with sinus tract underlying the 5th metatarsal. Suspected 5 th metatarsal osteomyelitis. Left ankle with signs of septic arthritis with infected tenosynovitis.    Orthopedics recommendation for below the knee amputation.    I have explained to the patient in detail his condition, severe left foot infection including bone and ankle joint involvement. Risk of worsening infection, blood stream invasion and death.   Patient continue to decline surgical intervention and will prefer IV antibiotics for 4 to 6 weeks before considering amputation.    Patient had a PICC line placed and will continue antibiotic therapy IV until 08//22 with close follow up as outpatient.   Left diabetic foot infection, cellulitis, osteomyelitis, left ankle septic joint, complicated with severe sepsis.  Endorgan damage lactic acidosis.  Present on admission.  Patient was admitted to the progressive care unit, he received intravenous fluids and broad-spectrum antibiotic therapy. Further work-up with MRI which shows signs of osteomyelitis left fifth metatarsal bone and septic joint  at the left ankle. Orthopedics recommended surgery with below the knee amputation.  His ulcer has not healed despite extensive wound care and antibiotics.  This is threatening both his limb and his life.  He has been explained this in detail and he has decided not to have surgery at this point and continue with IV antibiotics.  His discharge white cell count is 4.4 Blood cultures are no growth in 5 days.  Continue antibiotic therapy with vancomycin and ertapenem as an outpatient. Follow-up with infectious disease and orthopedics.  Palliative wound care:  Given the recommendation for surgery,  proceed with conservative care orders for Nursing to minimize the risk of infection with foot as a portal of entry. Twice daily wound care using normal saline moistened gauze and topped with a dry dressing and secured with Kerlix roll gauze and paper tape are indicated.  2.  Alcohol abuse, chronic alcoholic cirrhosis, alcohol withdrawal.  Patient was placed on lorazepam per CIWA protocol with good toleration. His withdrawal symptoms resolved rapidly. Continue with thiamine and multivitamins.  3.  Chronic diastolic heart failure.  Hypertension.  Continue blood pressure control. No signs of heart failure exacerbation. Resume furosemide.   4.  Hypothyroidism.  Continue levothyroxine.  5.  Obesity class II.  Obstructive sleep apnea.  Calculated BMI 36.9. Continue CPAP.  6.  Type 2 diabetes mellitus.  His glucose remained stable, he was placed on insulin sliding scale for glucose coverage and monitoring.  7.  Hypokalemia.  Resolved.  8.  Depression.  Continue duloxetine, trazodone and hydroxyzine  Discharge Diagnoses:  Principal Problem:   Severe sepsis (HCC) Active Problems:   Hypertension   Morbid obesity (HCC)   OSA (obstructive sleep apnea)   Hyperlipidemia   Prediabetes   Ulcer of left heel (HCC)   Anemia   Hypothyroidism   Alcoholic cirrhosis of liver without ascites (HCC)    Alcoholic peripheral neuropathy (HCC)   Osteomyelitis (HCC)   Alcohol abuse   Diabetic foot ulcer (HCC)    Discharge Instructions   Allergies as of 05/06/2021       Reactions   Claritin [loratadine] Shortness Of Breath, Anxiety        Medication List     STOP taking these medications    cloNIDine 0.1 MG tablet Commonly known as: CATAPRES   disulfiram 250 MG tablet Commonly known as: ANTABUSE       TAKE these medications    acamprosate 333 MG tablet Commonly known as: CAMPRAL Take 333 mg by mouth 2 (two) times daily.   docusate sodium 100 MG capsule Commonly known as: COLACE TAKE 1 CAPSULE (100 MG TOTAL) BY MOUTH DAILY AS NEEDED.   DULoxetine 20 MG capsule Commonly known as: CYMBALTA Take 1 capsule (20 mg total) by mouth daily.   ertapenem 1,000 mg in sodium chloride 0.9 % 100 mL Inject 1,000 mg into the vein daily.   feeding supplement (GLUCERNA SHAKE) Liqd Take 237 mLs by mouth 3 (three) times daily between meals.   furosemide 40 MG tablet Commonly known as: LASIX TAKE 1 TABLET BY MOUTH TWICE A DAY   gabapentin 100 MG capsule Commonly known as: NEURONTIN Take 1 capsule (100 mg total) by mouth 3 (three) times daily.   HEMP OIL-VANILLYL BUTYL ETHER EX Apply 1 application topically 2 (two) times  daily as needed (arthritis pain).   hydrOXYzine 25 MG tablet Commonly known as: ATARAX/VISTARIL Take 25 mg by mouth every 6 (six) hours as needed for anxiety.   levothyroxine 50 MCG tablet Commonly known as: SYNTHROID Take 50 mcg by mouth daily before breakfast.   multivitamin with minerals Tabs tablet Take 1 tablet by mouth daily.   naltrexone 50 MG tablet Commonly known as: DEPADE Take 50 mg by mouth daily.   ondansetron 4 MG tablet Commonly known as: ZOFRAN Take 4 mg by mouth every 8 (eight) hours as needed for nausea or vomiting.   potassium chloride SA 20 MEQ tablet Commonly known as: Klor-Con M20 Take 3 tablets (60 mEq total) by mouth 2  (two) times daily.   traZODone 50 MG tablet Commonly known as: DESYREL TAKE 1-2 TABLETS (50-100 MG TOTAL) BY MOUTH AT BEDTIME AS NEEDED FOR SLEEP.   vancomycin 1250 MG/250ML Soln Commonly known as: VANCOREADY Inject 250 mLs (1,250 mg total) into the vein every 12 (twelve) hours.   vitamin B-12 100 MCG tablet Commonly known as: CYANOCOBALAMIN Take 100 mcg by mouth daily.        Follow-up Information     Caleb Popp Follow up on 06/10/2021.   Why: [id number] [place] 12:10 pm Contact information: [address] [place] [address] [phone]                Allergies  Allergen Reactions   Claritin [Loratadine] Shortness Of Breath and Anxiety    Consultations: Orthopedics ID    Procedures/Studies: MR FOOT LEFT W WO CONTRAST  Result Date: 04/29/2021 CLINICAL DATA:  Diabetic foot ulcer EXAM: MRI OF THE LEFT FOOT WITHOUT AND WITH CONTRAST TECHNIQUE: Multiplanar, multisequence MR imaging of the left foot was performed both before and after administration of intravenous contrast. CONTRAST:  10mL GADAVIST GADOBUTROL 1 MMOL/ML IV SOLN COMPARISON:  X-ray 04/27/2021 FINDINGS: Technical note: Examination is significantly degraded by motion artifact. Best possible images were obtained and submitted for interpretation. Large plantar foot ulceration underlying the midfoot at the level of the TMT joints measuring approximately 4.3 x 3.5 cm in diameter. There is a enhancing sinus tract with fluid extending to the underlying fifth metatarsal base. Bone marrow edema within the fifth metatarsal base with intermediate T1 marrow signal is suspicious for early acute osteomyelitis (series 6 and 8, image 7). There is mild marrow edema within the fourth metatarsal base and cuboid bone without appreciable changes of the T1 signal, nonspecific and may be reactive. Degenerative changes of the left foot and ankle with prominent hallux valgus deformity. Large tibiotalar  joint effusion with synovial enhancement. There is also fluid within the posterior subtalar joint. Large volume tenosynovial fluid collections at the posteromedial and posterolateral aspects of the ankle, which appear contiguous with the tibiotalar joint fluid. A portion of fluid extends into the more superficial soft tissues along the lateral aspect of the Achilles tendon. Collections are all rim enhancing. Marked diffuse soft tissue swelling and edema. IMPRESSION: 1. Motion degraded exam. 2. Large plantar foot ulceration underlying the level of the TMT joints measuring approximately 4.3 x 3.5 cm in diameter. There is a sinus tract with fluid extending to the underlying fifth metatarsal base. 3. Bone marrow edema within the fifth metatarsal base with intermediate T1 marrow signal is suspicious for early acute osteomyelitis. Mild marrow edema within the fourth metatarsal base and cuboid bone without appreciable T1 signal changes, nonspecific and may be reactive. 4. Large tibiotalar joint  effusion with synovial enhancement. Large volume tenosynovial fluid collections at the posteromedial and posterolateral aspects of the ankle, which appear contiguous with the tibiotalar joint fluid. A portion of fluid extends into the more superficial soft tissues along the lateral aspect of the Achilles tendon. Findings are most suggestive of septic arthritis with infected tenosynovitis. These results will be called to the ordering clinician or representative by the Radiologist Assistant, and communication documented in the PACS or [employer]. Electronically Signed   By: Duanne Guess D.O.   On: 04/29/2021 13:40   DG CHEST PORT 1 VIEW  Result Date: 04/28/2021 CLINICAL DATA:  Diabetic foot infection, history of CHF, diabetes, hypertension, former smoker EXAM: PORTABLE CHEST 1 VIEW COMPARISON:  Radiograph 11/24/2020 FINDINGS: No consolidation, features of edema, pneumothorax, or effusion. Pulmonary vascularity is  normally distributed. The cardiomediastinal contours are unremarkable for the portable technique. No acute osseous or soft tissue abnormality. IMPRESSION: No acute cardiopulmonary abnormality. Electronically Signed   By: Kreg Shropshire M.D.   On: 04/28/2021 00:24   DG Foot Complete Left  Result Date: 04/27/2021 CLINICAL DATA:  Diabetic foot ulcers with soft tissue swelling erythema, initial encounter EXAM: LEFT FOOT - COMPLETE 3+ VIEW COMPARISON:  10/15/2019 FINDINGS: Generalized soft tissue swelling is noted about the ankle and foot. Changes of prior amputation in the midportion of the third metatarsal are seen. Large soft tissue ulcer is noted along the plantar aspect of the foot near the base of the metatarsals. Mottled density is noted within the proximal aspect of the fifth metatarsal suspicious for osteomyelitis. Degenerative changes of the first MTP joint are seen. No acute fracture is noted. IMPRESSION: Considerable soft tissue swelling with plantar diabetic foot ulcer. Some mottled appearance to the base of the fifth metatarsal is seen which may represent some underlying osteomyelitis. Nonemergent MRI may be helpful for further evaluation. Electronically Signed   By: Alcide Clever M.D.   On: 04/27/2021 21:22   VAS [place] ABI WITH/WO TBI  Result Date: 04/29/2021  LOWER EXTREMITY DOPPLER STUDY Patient Name:  JAEQUAN PROPES  Date of Exam:   04/29/2021 Medical Rec #: [id number]     Accession #:    [id number] Date of Birth: 30-Dec-1960     Patient Gender: M Patient Age:   53Y Exam Location:  [facility] Procedure:      VAS [place] ABI WITH/WO TBI Referring Phys: [id number] ANASTASSIA DOUTOVA --------------------------------------------------------------------------------  Indications: Rest pain, and ulceration. High Risk Factors: Hypertension, hyperlipidemia.  Comparison Study: 02/17/18 prior Performing Technologist: Argentina Ponder RVS  Examination Guidelines: A complete evaluation includes at minimum, Doppler waveform  signals and systolic blood pressure reading at the level of bilateral brachial, anterior tibial, and posterior tibial arteries, when vessel segments are accessible. Bilateral testing is considered an integral part of a complete examination. Photoelectric Plethysmograph (PPG) waveforms and toe systolic pressure readings are included as required and additional duplex testing as needed. Limited examinations for reoccurring indications may be performed as noted.  ABI Findings: +--------+------------------+-----+---------+--------+ Right   Rt Pressure (mmHg)IndexWaveform Comment  +--------+------------------+-----+---------+--------+ [id number]                    triphasic         +--------+------------------+-----+---------+--------+ PTA     158               1.05 triphasic         +--------+------------------+-----+---------+--------+ DP      149  0.99 triphasic         +--------+------------------+-----+---------+--------+ +--------+------------------+-----+---------+-------+ Left    Lt Pressure (mmHg)IndexWaveform Comment +--------+------------------+-----+---------+-------+ [id number]                    triphasic        +--------+------------------+-----+---------+-------+ PTA     149               0.99 triphasic        +--------+------------------+-----+---------+-------+ DP      141               0.94 triphasic        +--------+------------------+-----+---------+-------+ +-------+-----------+-----------+------------+------------+ ABI/TBIToday's ABIToday's TBIPrevious ABIPrevious TBI +-------+-----------+-----------+------------+------------+ Right  1.05                                           +-------+-----------+-----------+------------+------------+ Left   0.99                                           +-------+-----------+-----------+------------+------------+  Summary: Right: Resting right ankle-brachial index is within normal  range. No evidence of significant right lower extremity arterial disease. Left: Resting left ankle-brachial index is within normal range. No evidence of significant left lower extremity arterial disease.  *See table(s) above for measurements and observations.  Electronically signed by Heath Lark on 04/29/2021 at 7:22:34 PM.    Final    VAS [place] LOWER EXTREMITY VENOUS (DVT)  Result Date: 04/28/2021  Lower Venous DVT Study Patient Name:  VICKI PASQUAL  Date of Exam:   04/28/2021 Medical Rec #: [id number]     Accession #:    [id number] Date of Birth: April 11, 1961     Patient Gender: M Patient Age:   59Y Exam Location:  [facility] Procedure:      VAS [place] LOWER EXTREMITY VENOUS (DVT) Referring Phys: [id number] ANASTASSIA DOUTOVA --------------------------------------------------------------------------------  Indications: Edema, and Erythema.  Limitations: Poor ultrasound/tissue interface, body habitus and open wound. Comparison Study: 01/29/2019 negative lower extremity venous duplex Performing Technologist: Gertie Fey MHA, RDMS, RVT, RDCS  Examination Guidelines: A complete evaluation includes B-mode imaging, spectral Doppler, color Doppler, and power Doppler as needed of all accessible portions of each vessel. Bilateral testing is considered an integral part of a complete examination. Limited examinations for reoccurring indications may be performed as noted. The reflux portion of the exam is performed with the patient in reverse Trendelenburg.  +-----+---------------+---------+-----------+----------+--------------+ RIGHTCompressibilityPhasicitySpontaneityPropertiesThrombus Aging +-----+---------------+---------+-----------+----------+--------------+ CFV  Full           Yes      Yes                                 +-----+---------------+---------+-----------+----------+--------------+   +---------+---------------+---------+-----------+----------+--------------+ LEFT      CompressibilityPhasicitySpontaneityPropertiesThrombus Aging +---------+---------------+---------+-----------+----------+--------------+ CFV      Full           Yes      Yes                                 +---------+---------------+---------+-----------+----------+--------------+ SFJ      Full                                                        +---------+---------------+---------+-----------+----------+--------------+  FV Prox  Full                                                        +---------+---------------+---------+-----------+----------+--------------+ FV Mid   Full                                                        +---------+---------------+---------+-----------+----------+--------------+ FV DistalFull                                                        +---------+---------------+---------+-----------+----------+--------------+ PFV      Full                                                        +---------+---------------+---------+-----------+----------+--------------+ POP      Full           Yes      Yes                                 +---------+---------------+---------+-----------+----------+--------------+ PTV      Full                                                        +---------+---------------+---------+-----------+----------+--------------+ PERO     Full                                                        +---------+---------------+---------+-----------+----------+--------------+     Summary: RIGHT: - No evidence of common femoral vein obstruction.  LEFT: - There is no evidence of deep vein thrombosis in the lower extremity.  - No cystic structure found in the popliteal fossa.  *See table(s) above for measurements and observations. Electronically signed by Fabienne Brunsharles Fields MD on 04/28/2021 at 5:10:10 PM.    Final    US EKG SITE RITE  Result Date: 05/04/2021 If Site Rite image not attached, placement could not  be confirmed due to current cardiac rhythm.    Procedures: PICC line    Subjective: Patient seen and examined.  He has no complaints.  He is aware of the plan to discharge him to SNF today.  Discharge Exam: Vitals:   05/06/21 0401 05/06/21 0748  BP: 115/72 133/69  Pulse: 83 83  Resp: 17 18  Temp: 98.8 F (37.1 C) 98 F (36.7 C)  SpO2: 98% 98%   Vitals:   05/05/21 2009 05/05/21 2350 05/06/21 0401 05/06/21 0748  BP: (!) 136/94 139/72  115/72 133/69  Pulse: 87 94 83 83  Resp: Temp: 98 F (36.7 C) 98 F (36.7 C) 98.8 F (37.1 C) 98 F (36.7 C)  TempSrc: Oral Oral Oral Oral  SpO2: 91% 96% 98% 98%  Weight:      Height:       General exam: Appears calm and comfortable  Respiratory system: Clear to auscultation. Respiratory effort normal. Cardiovascular system: S1 & S2 heard, RRR. No JVD, murmurs, rubs, gallops or clicks. No pedal edema. Gastrointestinal system: Abdomen is nondistended, soft and nontender. No organomegaly or masses felt. Normal bowel sounds heard. Central nervous system: Alert and oriented. No focal neurological deficits. Extremities: Dressing in the right foot. Skin: No rashes, lesions or ulcers.  Psychiatry: Judgement and insight appear normal. Mood & affect appropriate.  The results of significant diagnostics from this hospitalization (including imaging, microbiology, ancillary and laboratory) are listed below for reference.     Microbiology: Recent Results (from the past 240 hour(s))  Blood Cultures x 2 sites     Status: None   Collection Time: 04/27/21 10:24 PM   Specimen: BLOOD  Result Value Ref Range Status   Specimen Description   Final    BLOOD LEFT ARM Performed at [facility], [address], [place], [place] [address]    Special Requests   Final    BOTTLES DRAWN AEROBIC AND ANAEROBIC Blood Culture results may not be optimal due to an inadequate volume of blood received in culture bottles Performed at [facility], [address] [place], [place] [address]    Culture   Final    NO GROWTH 5 DAYS Performed at [facility], [address], [place], [place] [address]    Report Status 05/03/2021 FINAL  Final  Blood Cultures x 2 sites     Status: None   Collection Time: 04/27/21 10:53 PM   Specimen: BLOOD  Result Value Ref Range Status   Specimen Description   Final    BLOOD FINGER Performed at [facility], [address], [place], [place] [address]    Special Requests   Final    BOTTLES DRAWN AEROBIC AND ANAEROBIC Blood Culture adequate volume Performed at [facility], [address], [place], [place] [address]    Culture   Final    NO GROWTH 5 DAYS Performed at [facility], [address] [place], [place] [address]    Report Status 05/03/2021 FINAL  Final  Resp Panel by RT-PCR (Flu A&B, Covid) Nasopharyngeal Swab     Status: None   Collection Time: 04/28/21 12:00 AM   Specimen: Nasopharyngeal Swab; Nasopharyngeal(NP) swabs in vial transport medium  Result Value Ref Range Status   SARS Coronavirus 2 by RT PCR NEGATIVE NEGATIVE Final    Comment: (NOTE) SARS-CoV-2 target nucleic acids are NOT DETECTED.  The SARS-CoV-2 RNA is generally detectable in upper respiratory specimens during the acute phase of infection. The lowest concentration of SARS-CoV-2 viral copies this assay can detect is 138 copies/mL. A negative result does not preclude SARS-Cov-2 infection and should not be used as the sole basis for treatment or other patient management decisions. A negative result may occur with  improper specimen collection/handling, submission of specimen other than nasopharyngeal swab, presence of viral mutation(s) within the areas targeted by this assay, and inadequate number of viral copies(<138 copies/mL). A negative result must be combined with clinical observations, patient history, and epidemiological  information.  The expected result is Negative.  Fact Sheet for Patients:  BloggerCourse.com  Fact Sheet for Healthcare Providers:  SeriousBroker.it  This test is no t yet approved or cleared by the [place] FDA and  has been authorized for detection and/or diagnosis of SARS-CoV-2 by FDA under an Emergency Use Authorization (EUA). This EUA will remain  in effect (meaning this test can be used) for the duration of the COVID-19 declaration under Section 564(b)(1) of the Act, 21 U.S.C.section 360bbb-3(b)(1), unless the authorization is terminated  or revoked sooner.       Influenza A by PCR NEGATIVE NEGATIVE Final   Influenza B by PCR NEGATIVE NEGATIVE Final    Comment: (NOTE) The Xpert Xpress SARS-CoV-2/FLU/RSV plus assay is intended as an aid in the diagnosis of influenza from Nasopharyngeal swab specimens and should not be used as a sole basis for treatment. Nasal washings and aspirates are unacceptable for Xpert Xpress SARS-CoV-2/FLU/RSV testing.  Fact Sheet for Patients: BloggerCourse.com  Fact Sheet for Healthcare Providers: SeriousBroker.it  This test is not yet approved or cleared by the [place] FDA and has been authorized for detection and/or diagnosis of SARS-CoV-2 by FDA under an Emergency Use Authorization (EUA). This EUA will remain in effect (meaning this test can be used) for the duration of the COVID-19 declaration under Section 564(b)(1) of the Act, 21 U.S.C. section 360bbb-3(b)(1), unless the authorization is terminated or revoked.  Performed at [facility], [address], [place], [place] [address]   SARS CORONAVIRUS 2 (TAT 6-24 HRS) Nasopharyngeal Nasopharyngeal Swab     Status: None   Collection Time: 05/05/21 10:48 AM   Specimen: Nasopharyngeal Swab  Result Value Ref Range Status   SARS Coronavirus 2 NEGATIVE NEGATIVE Final    Comment:  (NOTE) SARS-CoV-2 target nucleic acids are NOT DETECTED.  The SARS-CoV-2 RNA is generally detectable in upper and lower respiratory specimens during the acute phase of infection. Negative results do not preclude SARS-CoV-2 infection, do not rule out co-infections with other pathogens, and should not be used as the sole basis for treatment or other patient management decisions. Negative results must be combined with clinical observations, patient history, and epidemiological information. The expected result is Negative.  Fact Sheet for Patients: HairSlick.no  Fact Sheet for Healthcare Providers: quierodirigir.com  This test is not yet approved or cleared by the [place] FDA and  has been authorized for detection and/or diagnosis of SARS-CoV-2 by FDA under an Emergency Use Authorization (EUA). This EUA will remain  in effect (meaning this test can be used) for the duration of the COVID-19 declaration under Se ction 564(b)(1) of the Act, 21 U.S.C. section 360bbb-3(b)(1), unless the authorization is terminated or revoked sooner.  Performed at [facility], [address], [place], [place] [address]      Labs: BNP (last 3 results) Recent Labs    05/29/20 0018  BNP 36.6    Basic Metabolic Panel: Recent Labs  Lab 04/30/21 0440 05/01/21 0119 05/02/21 0125 05/03/21 0509 05/04/21 0500  NA 132* 134* 136 134* 135  K 3.0* 3.3* 3.8 3.8 3.9  CL 99 104 107 104 103  CO2 25 25 21* 23 23  GLUCOSE 87 84 99 88 81  BUN 5*  CREATININE 0.77 0.67 0.63 0.69 0.73  CALCIUM 8.2* 8.5* 8.5* 8.7* 9.2  MG  --   --  1.7  --   --     Liver Function Tests: No results  for input(s): AST, ALT, ALKPHOS, BILITOT, PROT, ALBUMIN in the last 168 hours. No results for input(s): LIPASE, AMYLASE in the last 168 hours. No results for input(s): AMMONIA in the last 168 hours. CBC: Recent Labs  Lab 04/30/21 0440 05/01/21 0119  05/02/21 0125 05/03/21 0509 05/04/21 0500  WBC 5.7 3.9* 3.5* 3.6* 4.4  NEUTROABS 4.2 2.7 2.1 1.9 2.5  HGB 9.8* 10.0* 9.9* 10.9* 10.5*  HCT 28.9* 29.6* 30.0* 32.9* 32.3*  MCV 96.7 96.7 99.0 97.1 97.9  PLT 140* 158 173 243 264    Cardiac Enzymes: No results for input(s): CKTOTAL, CKMB, CKMBINDEX, TROPONINI in the last 168 hours. BNP: Invalid input(s): POCBNP CBG: Recent Labs  Lab 05/05/21 0806 05/05/21 1156 05/05/21 1550 05/05/21 2125 05/06/21 0620  GLUCAP 83 119* 114* 124* 103*    D-Dimer No results for input(s): DDIMER in the last 72 hours. Hgb A1c No results for input(s): HGBA1C in the last 72 hours. Lipid Profile No results for input(s): CHOL, HDL, LDLCALC, TRIG, CHOLHDL, LDLDIRECT in the last 72 hours. Thyroid function studies No results for input(s): TSH, T4TOTAL, T3FREE, THYROIDAB in the last 72 hours.  Invalid input(s): FREET3 Anemia work up No results for input(s): VITAMINB12, FOLATE, FERRITIN, TIBC, IRON, RETICCTPCT in the last 72 hours. Urinalysis    Component Value Date/Time   COLORURINE AMBER (A) 04/28/2021 0230   APPEARANCEUR CLEAR 04/28/2021 0230   LABSPEC 1.012 04/28/2021 0230   PHURINE 6.0 04/28/2021 0230   GLUCOSEU NEGATIVE 04/28/2021 0230   HGBUR SMALL (A) 04/28/2021 0230   BILIRUBINUR NEGATIVE 04/28/2021 0230   KETONESUR NEGATIVE 04/28/2021 0230   PROTEINUR NEGATIVE 04/28/2021 0230   NITRITE NEGATIVE 04/28/2021 0230   LEUKOCYTESUR NEGATIVE 04/28/2021 0230   Sepsis Labs Invalid input(s): PROCALCITONIN,  WBC,  LACTICIDVEN Microbiology Recent Results (from the past 240 hour(s))  Blood Cultures x 2 sites     Status: None   Collection Time: 04/27/21 10:24 PM   Specimen: BLOOD  Result Value Ref Range Status   Specimen Description   Final    BLOOD LEFT ARM Performed at [facility], [address] [place], [place] [address]    Special Requests   Final    BOTTLES DRAWN AEROBIC AND ANAEROBIC Blood Culture results may not  be optimal due to an inadequate volume of blood received in culture bottles Performed at [facility], [address] [place], [place] [address]    Culture   Final    NO GROWTH 5 DAYS Performed at [facility], [address] [place], [place] [address]    Report Status 05/03/2021 FINAL  Final  Blood Cultures x 2 sites     Status: None   Collection Time: 04/27/21 10:53 PM   Specimen: BLOOD  Result Value Ref Range Status   Specimen Description   Final    BLOOD FINGER Performed at [facility], [address] [place], [place] [address]    Special Requests   Final    BOTTLES DRAWN AEROBIC AND ANAEROBIC Blood Culture adequate volume Performed at [facility], [address], [place], [place] [address]    Culture   Final    NO GROWTH 5 DAYS Performed at [facility], [address], [place], [place] [address]    Report Status 05/03/2021 FINAL  Final  Resp Panel by RT-PCR (Flu A&B, Covid) Nasopharyngeal Swab     Status: None   Collection Time: 04/28/21 12:00 AM   Specimen: Nasopharyngeal  Swab; Nasopharyngeal(NP) swabs in vial transport medium  Result Value Ref Range Status   SARS Coronavirus 2 by RT PCR NEGATIVE NEGATIVE Final    Comment: (NOTE) SARS-CoV-2 target nucleic acids are NOT DETECTED.  The SARS-CoV-2 RNA is generally detectable in upper respiratory specimens during the acute phase of infection. The lowest concentration of SARS-CoV-2 viral copies this assay can detect is 138 copies/mL. A negative result does not preclude SARS-Cov-2 infection and should not be used as the sole basis for treatment or other patient management decisions. A negative result may occur with  improper specimen collection/handling, submission of specimen other than nasopharyngeal swab, presence of viral mutation(s) within the areas targeted by this assay, and inadequate number of viral copies(<138 copies/mL). A negative result  must be combined with clinical observations, patient history, and epidemiological information. The expected result is Negative.  Fact Sheet for Patients:  BloggerCourse.com  Fact Sheet for Healthcare Providers:  SeriousBroker.it  This test is no t yet approved or cleared by the [place] FDA and  has been authorized for detection and/or diagnosis of SARS-CoV-2 by FDA under an Emergency Use Authorization (EUA). This EUA will remain  in effect (meaning this test can be used) for the duration of the COVID-19 declaration under Section 564(b)(1) of the Act, 21 U.S.C.section 360bbb-3(b)(1), unless the authorization is terminated  or revoked sooner.       Influenza A by PCR NEGATIVE NEGATIVE Final   Influenza B by PCR NEGATIVE NEGATIVE Final    Comment: (NOTE) The Xpert Xpress SARS-CoV-2/FLU/RSV plus assay is intended as an aid in the diagnosis of influenza from Nasopharyngeal swab specimens and should not be used as a sole basis for treatment. Nasal washings and aspirates are unacceptable for Xpert Xpress SARS-CoV-2/FLU/RSV testing.  Fact Sheet for Patients: BloggerCourse.com  Fact Sheet for Healthcare Providers: SeriousBroker.it  This test is not yet approved or cleared by the [place] FDA and has been authorized for detection and/or diagnosis of SARS-CoV-2 by FDA under an Emergency Use Authorization (EUA). This EUA will remain in effect (meaning this test can be used) for the duration of the COVID-19 declaration under Section 564(b)(1) of the Act, 21 U.S.C. section 360bbb-3(b)(1), unless the authorization is terminated or revoked.  Performed at [facility], [address], [place], [place] [address]   SARS CORONAVIRUS 2 (TAT 6-24 HRS) Nasopharyngeal Nasopharyngeal Swab     Status: None   Collection Time: 05/05/21 10:48 AM   Specimen: Nasopharyngeal  Swab  Result Value Ref Range Status   SARS Coronavirus 2 NEGATIVE NEGATIVE Final    Comment: (NOTE) SARS-CoV-2 target nucleic acids are NOT DETECTED.  The SARS-CoV-2 RNA is generally detectable in upper and lower respiratory specimens during the acute phase of infection. Negative results do not preclude SARS-CoV-2 infection, do not rule out co-infections with other pathogens, and should not be used as the sole basis for treatment or other patient management decisions. Negative results must be combined with clinical observations, patient history, and epidemiological information. The expected result is Negative.  Fact Sheet for Patients: HairSlick.no  Fact Sheet for Healthcare Providers: quierodirigir.com  This test is not yet approved or cleared by the [place] FDA and  has been authorized for detection and/or diagnosis of SARS-CoV-2 by FDA under an Emergency Use Authorization (EUA). This EUA will remain  in effect (meaning this test can be used) for the duration of the COVID-19 declaration under Se ction 564(b)(1) of the Act, 21 U.S.C. section 360bbb-3(b)(1), unless the  authorization is terminated or revoked sooner.  Performed at [facility], [address] [place], [place] [address]      Time coordinating discharge: 45 minutes  SIGNED:   Hughie Closs, MD  [facility] 05/06/2021, 10:26 AM

## 2021-05-08 NOTE — Consult Note (Signed)
   [facility] CM Inpatient Consult   05/08/2021  Deuce Paternoster 03-Jan-1961 [id number]  Late entry for 05/07/21 1300  [facility] [[facility]]  Accountable Care Organization [ACO] Patient: Medicare   Will request patient to be followed by [facility] [[facility]] Care Management PAC with traditional Medicare for any known or needs for transitional care needs for returning to post facility care or complex disease management.  For questions or referrals, please contact:   Charlesetta Shanks, RN BSN CCM [facility]  [phone] business mobile phone Toll free office [phone]  Fax number: [phone] [email or web address]

## 2021-05-20 ENCOUNTER — Telehealth (INDEPENDENT_AMBULATORY_CARE_PROVIDER_SITE_OTHER): Payer: Medicare Other | Admitting: Infectious Disease

## 2021-05-20 ENCOUNTER — Other Ambulatory Visit: Payer: Self-pay

## 2021-05-20 ENCOUNTER — Encounter: Payer: Self-pay | Admitting: Infectious Disease

## 2021-05-20 DIAGNOSIS — G621 Alcoholic polyneuropathy: Secondary | ICD-10-CM

## 2021-05-20 DIAGNOSIS — M87051 Idiopathic aseptic necrosis of right femur: Secondary | ICD-10-CM | POA: Diagnosis not present

## 2021-05-20 DIAGNOSIS — M869 Osteomyelitis, unspecified: Secondary | ICD-10-CM

## 2021-05-20 DIAGNOSIS — M87052 Idiopathic aseptic necrosis of left femur: Secondary | ICD-10-CM

## 2021-05-20 DIAGNOSIS — L97502 Non-pressure chronic ulcer of other part of unspecified foot with fat layer exposed: Secondary | ICD-10-CM

## 2021-05-20 DIAGNOSIS — K7030 Alcoholic cirrhosis of liver without ascites: Secondary | ICD-10-CM

## 2021-05-20 DIAGNOSIS — S98131A Complete traumatic amputation of one right lesser toe, initial encounter: Secondary | ICD-10-CM

## 2021-05-20 DIAGNOSIS — L97429 Non-pressure chronic ulcer of left heel and midfoot with unspecified severity: Secondary | ICD-10-CM

## 2021-05-20 DIAGNOSIS — I5032 Chronic diastolic (congestive) heart failure: Secondary | ICD-10-CM

## 2021-05-20 DIAGNOSIS — F1020 Alcohol dependence, uncomplicated: Secondary | ICD-10-CM

## 2021-05-20 NOTE — Progress Notes (Signed)
Virtual Visit via Telephone Note  I connected with [name] on 05/20/21 at 10:15 AM EDT by telephone and verified that I am speaking with the correct person using two identifiers.  Location: Patient: Nursing Home Provider: [facility]   I discussed the limitations, risks, security and privacy concerns of performing an evaluation and management service by telephone and the availability of in person appointments. I also discussed with the patient that there may be a patient responsible charge related to this service. The patient expressed understanding and agreed to proceed.   History of Present Illness:  [name] is a 60 year old Caucasian man with a history of multiple medical problems including alcoholism peripheral vascular disease neuropathy, possibly due to B12 deficiency, lymphedema diabetes but never with a terribly high A1c now found to have osteomyelitis involving his tibia talar bone cuboid and fifth metatarsal.  Saw him in the hospital and at that time he was completely against a below the knee amputation which we feel strongly he would need for cure.  Absent cultures to guide therapy we placed him on vancomycin and ertapenem and he has been discharged to skilled nursing facility with plans for antibiotics to continue for a few more weeks into August.  I believe August 22nd his stop date based on Minh Pham's note.  He tells me he is receiving wound care at the nursing facility and they are performing debridements on the wound and that has been improving.  He is scheduled to see his orthopedic surgeon with [facility] in [place] on June 11, 2021.  I will have him see me before then and before he finishes his IV antibiotics with plans of placing him on oral antibiotics to hopefully still suppress the infection in his ankle.  He does not have much in the way of pain at the site.  He has an appointment to see his orthopedic surgeon  Past Medical History:  Diagnosis Date   Alcohol  dependence ([place])    Arthritis    hips, hands   Bilateral carpal tunnel syndrome    Bilateral leg edema    Chronic   CHF (congestive heart failure) ([place])    Diabetes mellitus without complication (HCC)    type 2   Diverticulitis    portion of colon removed   DOE (dyspnea on exertion)    occ   Elevated liver enzymes    Fatty liver    GERD (gastroesophageal reflux disease)    occ   Hammer toe    Hip pain    History of ventral hernia repair 2016   x2   Hyperlipidemia    pt unsure   Hypertension    Marijuana abuse    Morbid obesity ([place])    Neuromuscular disorder ([place])    peripheral neuropathy feet and few fingers   OSA (obstructive sleep apnea)    has OSA-not used CPAP 2-3 yrs could not tolerate cpap   PONV (postoperative nausea and vomiting)    Toe ulcer ([place])    left healed    Past Surgical History:  Procedure Laterality Date   AMPUTATION TOE Left 05/25/2018   Procedure: AMPUTATION TOE left 3rd;  Surgeon: Wylene Simmer, MD;  Location: [facility];  Service: Orthopedics;  Laterality: Left;  72mn, to follow   AMPUTATION TOE Left 06/28/2018   Procedure: Left foot revision 3rd toe amputation including 3rd metatarsal;  Surgeon: HWylene Simmer MD;  [place]  Service: Orthopedics;  Laterality: Left;  639m  BICEPS TENDON REPAIR Left 2014   Partial   COLONOSCOPY     GRAFT APPLICATION Right 01/14/761   Procedure: GRAFT APPLICATION;  Surgeon: Landis Martins, DPM;  Location: [facility];  Service: Podiatry;  Laterality: Right;   HERNIA REPAIR  2016   ventral   HIP CLOSED REDUCTION Right 09/01/2018   Procedure: CLOSED REDUCTION HIP;  Surgeon: Nicholes Stairs, MD;  Location: [facility];  Service: Orthopedics;  Laterality: Right;   INCISION AND DRAINAGE OF WOUND Right 03/03/2020   Procedure: IRRIGATION AND DEBRIDEMENT WOUND;  Surgeon: Landis Martins, DPM;  Location: [facility];  Service: Podiatry;  Laterality:  Right;   JOINT REPLACEMENT     b/l knees    METATARSAL HEAD EXCISION Right 03/03/2020   Procedure: IRRIGATION OF TOE AND CAUTERIZATION OF BLEEDING TOE;  Surgeon: Landis Martins, DPM;  Location: [facility];  Service: Podiatry;  Laterality: Right;   METATARSAL HEAD EXCISION Right 03/03/2020   Procedure: METATARSAL HEAD EXCISION SECOND TOE RIGHT;  Surgeon: Landis Martins, DPM;  Location: [place];  Service: Podiatry;  Laterality: Right;  MAC WITH LOCAL   TOE AMPUTATION Right 09/2013   TOTAL HIP ARTHROPLASTY Right 08/16/2018   Procedure: TOTAL HIP ARTHROPLASTY ANTERIOR APPROACH;  Surgeon: Rod Can, MD;  Location: [facility];  Service: Orthopedics;  Laterality: Right;   TOTAL HIP ARTHROPLASTY Left 11/02/2018   Procedure: TOTAL HIP ARTHROPLASTY ANTERIOR APPROACH;  Surgeon: Rod Can, MD;  Location[facility];  Service: Orthopedics;  Laterality: Left;   TOTAL KNEE ARTHROPLASTY     bilat    Family History  Adopted: Yes  Family history unknown: Yes      Social History   Socioeconomic History   Marital status: Divorced    Spouse name: Not on file   Number of children: Not on file   Years of education: Not on file   Highest education level: Not on file  Occupational History   Occupation: unemployed, filing for disability  Tobacco Use   Smoking status: Former    Packs/day: 0.25    Years: 10.00    Pack years: 2.50    Types: Cigarettes   Smokeless tobacco: Never   Tobacco comments:    quit 2018  Vaping Use   Vaping Use: Never used  Substance and Sexual Activity   Alcohol use: Not Currently    Comment: 2-3 drinks 4-5 nights a week, liquor   Drug use: Yes    Frequency: 1.0 times per week    Types: Marijuana    Comment: "4 times a month, maybe"   Sexual activity: Not on file  Other Topics Concern   Not on file  Social History Narrative   Not on file   Social Determinants of Health   Financial Resource Strain: Not on file  Food Insecurity: Not on file   Transportation Needs: Not on file  Physical Activity: Not on file  Stress: Not on file  Social Connections: Not on file    Allergies  Allergen Reactions   Claritin [Loratadine] Shortness Of Breath and Anxiety     Current Outpatient Medications:    acamprosate (CAMPRAL) 333 MG tablet, Take 333 mg by mouth 2 (two) times daily., Disp: , Rfl:    docusate sodium (COLACE) 100 MG capsule, TAKE 1 CAPSULE (100 MG TOTAL) BY MOUTH DAILY AS NEEDED., Disp: 30 capsule, Rfl: 2   DULoxetine (CYMBALTA) 20 MG capsule, Take 1 capsule (20 mg total) by mouth daily., Disp: , Rfl:  ertapenem 1,000 mg in sodium chloride 0.9 % 100 mL, Inject 1,000 mg into the vein daily., Disp: , Rfl:    feeding supplement, GLUCERNA SHAKE, (GLUCERNA SHAKE) LIQD, Take 237 mLs by mouth 3 (three) times daily between meals., Disp: 21330 mL, Rfl: 0   furosemide (LASIX) 40 MG tablet, TAKE 1 TABLET BY MOUTH TWICE A DAY, Disp: 180 tablet, Rfl: 1   gabapentin (NEURONTIN) 100 MG capsule, Take 1 capsule (100 mg total) by mouth 3 (three) times daily., Disp: 90 capsule, Rfl: 3   HEMP OIL-VANILLYL BUTYL ETHER EX, Apply 1 application topically 2 (two) times daily as needed (arthritis pain)., Disp: , Rfl:    hydrOXYzine (ATARAX/VISTARIL) 25 MG tablet, Take 25 mg by mouth every 6 (six) hours as needed for anxiety., Disp: , Rfl:    levothyroxine (SYNTHROID) 50 MCG tablet, Take 50 mcg by mouth daily before breakfast., Disp: , Rfl:    Multiple Vitamin (MULTIVITAMIN WITH MINERALS) TABS tablet, Take 1 tablet by mouth daily., Disp: 30 tablet, Rfl: 0   naltrexone (DEPADE) 50 MG tablet, Take 50 mg by mouth daily., Disp: , Rfl:    ondansetron (ZOFRAN) 4 MG tablet, Take 4 mg by mouth every 8 (eight) hours as needed for nausea or vomiting., Disp: , Rfl:    potassium chloride SA (KLOR-CON M20) 20 MEQ tablet, Take 3 tablets (60 mEq total) by mouth 2 (two) times daily., Disp: 180 tablet, Rfl: 0   traZODone (DESYREL) 50 MG tablet, TAKE 1-2 TABLETS (50-100 MG  TOTAL) BY MOUTH AT BEDTIME AS NEEDED FOR SLEEP., Disp: 180 tablet, Rfl: 1   vancomycin (VANCOREADY) 1250 MG/250ML SOLN, Inject 250 mLs (1,250 mg total) into the vein every 12 (twelve) hours., Disp: 15000 mL, Rfl: 1   vitamin B-12 (CYANOCOBALAMIN) 100 MCG tablet, Take 100 mcg by mouth daily., Disp: , Rfl:  Review of systems as above otherwise 12 point view systems negative   Observations/Objective:  Jarick appeared to be in good spirits over the phone today  Assessment and Plan:  Osteomyelitis involving ankle via talar bones cuboid and fifth metatarsal  Will complete his IV antibiotics and will see him before then then I will likely extend him with empiric oral antibiotics.  I have counseled him again that I think he is going to clearly need a below the knee amputation to affect cure but perhaps we can buy him some time with IV followed by oral antibiotics.  Neuropathy according to him is not due to diabetes and the make sense given his A1c has never been that high suspect it may have been due to B12 deficiency in the context of alcoholism.    Follow Up Instructions:    I discussed the assessment and treatment plan with the patient. The patient was provided an opportunity to ask questions and all were answered. The patient agreed with the plan and demonstrated an understanding of the instructions.   The patient was advised to call back or seek an in-person evaluation if the symptoms worsen or if the condition fails to improve as anticipated.  I provided 21 minutes of non-face-to-face time during this encounter.   Alcide Evener, MD

## 2021-05-27 ENCOUNTER — Other Ambulatory Visit: Payer: Self-pay | Admitting: *Deleted

## 2021-05-27 NOTE — Patient Outreach (Signed)
Member screened for potential [facility] chronic care management services. Member's PCP office has a [facility] embedded care coordination team.  Communication sent to [facility] SN[facility] SW to inquire about anticipated transition plans and potential  care coordination needs  Will continue to follow while member resides in SNF.   Raiford Noble, MSN, RN,BSN [facility] Post Acute Care Coordinator [phone] [facility]) [phone]  (Toll free office)

## 2021-05-29 ENCOUNTER — Ambulatory Visit (INDEPENDENT_AMBULATORY_CARE_PROVIDER_SITE_OTHER): Payer: Medicare Other | Admitting: Cardiovascular Disease

## 2021-05-29 ENCOUNTER — Encounter: Payer: Self-pay | Admitting: Cardiovascular Disease

## 2021-05-29 ENCOUNTER — Other Ambulatory Visit: Payer: Self-pay

## 2021-05-29 DIAGNOSIS — I70229 Atherosclerosis of native arteries of extremities with rest pain, unspecified extremity: Secondary | ICD-10-CM

## 2021-05-29 DIAGNOSIS — I5032 Chronic diastolic (congestive) heart failure: Secondary | ICD-10-CM | POA: Diagnosis not present

## 2021-05-29 NOTE — Patient Instructions (Signed)
Medication Instructions:  Your physician recommends that you continue on your current medications as directed. Please refer to the Current Medication list given to you today.  *If you need a refill on your cardiac medications before your next appointment, please call your pharmacy*  Testing/Procedures: Your physician has requested that you have a lower extremity arterial duplex. This test is an ultrasound of the arteries in the legs. It looks at arterial blood flow in the legs. Allow one hour for Lower Arterial scans. There are no restrictions or special instructions  Follow-Up: At [facility], you and your health needs are our priority.  As part of our continuing mission to provide you with exceptional heart care, we have created designated Provider Care Teams.  These Care Teams include your primary Cardiologist (physician) and Advanced Practice Providers (APPs -  Physician Assistants and Nurse Practitioners) who all work together to provide you with the care you need, when you need it.  We recommend signing up for the patient portal called "MyChart".  Sign up information is provided on this After Visit Summary.  MyChart is used to connect with patients for Virtual Visits (Telemedicine).  Patients are able to view lab/test results, encounter notes, upcoming appointments, etc.  Non-urgent messages can be sent to your provider as well.   To learn more about what you can do with MyChart, go to [email or web address].    Your next appointment:   3 month(s)  The format for your next appointment:   In Person  Provider:   Nanetta Batty, MD

## 2021-05-29 NOTE — Assessment & Plan Note (Signed)
Chronic diastolic heart failure on p.o. furosemide.  Echo performed 04/25/2019 revealed normal LV systolic function without mention of diastolic dysfunction.  He was admitted last month to the hospital for evaluation of his foot and for diuresis.  His edema appears markedly improved.

## 2021-05-29 NOTE — Assessment & Plan Note (Signed)
[name] has a chronic wound on the dorsal aspect of his left foot which he has had for the last 2 years.  He has seen various wound care experts along the way.  Recent ABIs performed 04/29/2021 were essentially normal with triphasic waveforms.  I am going to get lower extremity arterial Dopplers on him.  I think he would benefit from referral to a wound care center potentially for hyperbaric oxygen.  He apparently did have an MRI on 04/29/2021 that suggested osteomyelitis.  He has a PICC line in and getting parenteral antibiotics.

## 2021-05-29 NOTE — Progress Notes (Signed)
05/29/2021 [name]   Feb 14, 1961  [id number]  Primary Physician Lawerance Bach, Bobette Mo, MD Primary Cardiologist: Runell Gess MD Nicholes Calamity, [place]  HPI:  [name] is a 60 y.o. moderately overweight divorced Caucasian male father 3, grandfather 1 grandchild is accompanied by his significant other [name].  He was referred by Dr. Victorino Dike, orthopedic surgeon, for peripheral vascular evaluation as well as cardiovascular valuation.  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  He quit smoking 3 years ago.  He is not diabetic, hypertensive or hyperlipidemic.  There is no family history of heart disease.  Is never had a heart attack or stroke.  He has had multiple orthopedic issues status post bilateral hip and knee replacements.  He has had a nonhealing ulcer on the dorsal aspect of his left foot for the last 2 years.  He was recently admitted to [facility] where a work-up including MRI suggested osteomyelitis.  PICC line was placed for parenteral antibiotics.  Lower extremity arterial Dopplers were performed in our office revealing normal ABIs with triphasic waveforms.  Patient denies chest pain or shortness of breath.   Current Meds  Medication Sig   docusate sodium (COLACE) 100 MG capsule TAKE 1 CAPSULE (100 MG TOTAL) BY MOUTH DAILY AS NEEDED.   DULoxetine (CYMBALTA) 20 MG capsule Take 1 capsule (20 mg total) by mouth daily.   ertapenem 1,000 mg in sodium chloride 0.9 % 100 mL Inject 1,000 mg into the vein daily.   feeding supplement, GLUCERNA SHAKE, (GLUCERNA SHAKE) LIQD Take 237 mLs by mouth 3 (three) times daily between meals.   furosemide (LASIX) 40 MG tablet TAKE 1 TABLET BY MOUTH TWICE A DAY   gabapentin (NEURONTIN) 100 MG capsule Take 1 capsule (100 mg total) by mouth 3 (three) times daily.   HEMP OIL-VANILLYL BUTYL ETHER EX Apply 1 application topically 2 (two) times daily as needed (arthritis pain).   hydrOXYzine (ATARAX/VISTARIL) 25 MG tablet Take 25 mg  by mouth every 6 (six) hours as needed for anxiety.   levothyroxine (SYNTHROID) 50 MCG tablet Take 50 mcg by mouth daily before breakfast.   Multiple Vitamin (MULTIVITAMIN WITH MINERALS) TABS tablet Take 1 tablet by mouth daily.   naltrexone (DEPADE) 50 MG tablet Take 50 mg by mouth daily.   ondansetron (ZOFRAN) 4 MG tablet Take 4 mg by mouth every 8 (eight) hours as needed for nausea or vomiting.   potassium chloride SA (KLOR-CON M20) 20 MEQ tablet Take 3 tablets (60 mEq total) by mouth 2 (two) times daily.   traZODone (DESYREL) 50 MG tablet TAKE 1-2 TABLETS (50-100 MG TOTAL) BY MOUTH AT BEDTIME AS NEEDED FOR SLEEP.   vancomycin (VANCOREADY) 1250 MG/250ML SOLN Inject 250 mLs (1,250 mg total) into the vein every 12 (twelve) hours.   vitamin B-12 (CYANOCOBALAMIN) 100 MCG tablet Take 100 mcg by mouth daily.     Allergies  Allergen Reactions   Claritin [Loratadine] Shortness Of Breath and Anxiety    Social History   Socioeconomic History   Marital status: Divorced    Spouse name: Not on file   Number of children: Not on file   Years of education: Not on file   Highest education level: Not on file  Occupational History   Occupation: unemployed, filing for disability  Tobacco Use   Smoking status: Former    Packs/day: 0.25    Years: 10.00    Pack years: 2.50    Types: Cigarettes  Smokeless tobacco: Never   Tobacco comments:    quit 2018  Vaping Use   Vaping Use: Never used  Substance and Sexual Activity   Alcohol use: Not Currently    Comment: 2-3 drinks 4-5 nights a week, liquor   Drug use: Yes    Frequency: 1.0 times per week    Types: Marijuana    Comment: "4 times a month, maybe"   Sexual activity: Not on file  Other Topics Concern   Not on file  Social History Narrative   Not on file   Social Determinants of Health   Financial Resource Strain: Not on file  Food Insecurity: Not on file  Transportation Needs: Not on file  Physical Activity: Not on file  Stress:  Not on file  Social Connections: Not on file  Intimate Partner Violence: Not on file     Review of Systems: General: negative for chills, fever, night sweats or weight changes.  Cardiovascular: negative for chest pain, dyspnea on exertion, edema, orthopnea, palpitations, paroxysmal nocturnal dyspnea or shortness of breath Dermatological: negative for rash Respiratory: negative for cough or wheezing Urologic: negative for hematuria Abdominal: negative for nausea, vomiting, diarrhea, bright red blood per rectum, melena, or hematemesis Neurologic: negative for visual changes, syncope, or dizziness All other systems reviewed and are otherwise negative except as noted above.    Blood pressure 137/86, pulse 92, height 6\' 1"  (1.854 m), weight 271 lb (122.9 kg), SpO2 95 %.  General appearance: alert and no distress Neck: no adenopathy, no carotid bruit, no JVD, supple, symmetrical, trachea midline, and thyroid not enlarged, symmetric, no tenderness/mass/nodules Lungs: clear to auscultation bilaterally Heart: regular rate and rhythm, S1, S2 normal, no murmur, click, rub or gallop Extremities: 1+ edema on the left, no edema on the right with venous stasis changes Pulses: 2+ and symmetric Skin: Nonhealing ulcer dorsal aspect of left foot Neurologic: Grossly normal  EKG not performed today  ASSESSMENT AND PLAN:   Critical lower limb ischemia [facility]) Mr. [name] has a chronic wound on the dorsal aspect of his left foot which he has had for the last 2 years.  He has seen various wound care experts along the way.  Recent ABIs performed 04/29/2021 were essentially normal with triphasic waveforms.  I am going to get lower extremity arterial Dopplers on him.  I think he would benefit from referral to a wound care center potentially for hyperbaric oxygen.  He apparently did have an MRI on 04/29/2021 that suggested osteomyelitis.  He has a PICC line in and getting parenteral antibiotics.  Chronic diastolic  CHF (congestive heart failure) (HCC) Chronic diastolic heart failure on p.o. furosemide.  Echo performed 04/25/2019 revealed normal LV systolic function without mention of diastolic dysfunction.  He was admitted last month to the hospital for evaluation of his foot and for diuresis.  His edema appears markedly improved.     06/26/2019 MD FACP,FACC,FAHA, [facility] 05/29/2021 2:39 PM

## 2021-06-01 NOTE — Addendum Note (Signed)
Addended by: Johney Frame A on: 06/01/2021 08:29 AM   Modules accepted: Orders

## 2021-06-03 ENCOUNTER — Other Ambulatory Visit: Payer: Self-pay | Admitting: *Deleted

## 2021-06-03 NOTE — Patient Outreach (Signed)
[facility] Post-Acute Care Coordinator follow up. Mr. Torbert resides in [facility]. Screened for potential [facility] care coordination needs.   Telephone call made to [facility] SW who reports transition plan is to return home with significant other. Member's iv antibiotics finish up next week.   Will continue to follow for potential [facility] services. Member's PCP has a [facility] embedded care coordination team.    Raiford Noble, MSN, RN,BSN [facility] Post Acute Care Coordinator [phone] [facility]) [phone]  (Toll free office)

## 2021-06-04 ENCOUNTER — Encounter (HOSPITAL_BASED_OUTPATIENT_CLINIC_OR_DEPARTMENT_OTHER): Payer: Medicare Other | Attending: Internal Medicine | Admitting: Internal Medicine

## 2021-06-04 ENCOUNTER — Other Ambulatory Visit: Payer: Self-pay

## 2021-06-04 DIAGNOSIS — E11621 Type 2 diabetes mellitus with foot ulcer: Secondary | ICD-10-CM | POA: Insufficient documentation

## 2021-06-04 DIAGNOSIS — I110 Hypertensive heart disease with heart failure: Secondary | ICD-10-CM | POA: Diagnosis not present

## 2021-06-04 DIAGNOSIS — I509 Heart failure, unspecified: Secondary | ICD-10-CM | POA: Insufficient documentation

## 2021-06-04 DIAGNOSIS — K7460 Unspecified cirrhosis of liver: Secondary | ICD-10-CM | POA: Diagnosis not present

## 2021-06-04 DIAGNOSIS — E1140 Type 2 diabetes mellitus with diabetic neuropathy, unspecified: Secondary | ICD-10-CM | POA: Insufficient documentation

## 2021-06-04 NOTE — Progress Notes (Signed)
[name] ([id number]) Visit Report for 06/04/2021 Abuse/Suicide Risk Screen Details Patient Name: Date of Service: [name], [name] 06/04/2021 1:15 PM Medical Record Number: [id number] Patient Account Number: [id number] Date of Birth/Sex: Treating RN: 10/29/1960 (60 y.o. [name] Primary Care [name]: [name] Other Clinician: Referring [name]: Treating [name]/Extender: [name] in Treatment: 0 Abuse/Suicide Risk Screen Items Answer ABUSE RISK SCREEN: Has anyone close to you tried to hurt or harm you recentlyo No Do you feel uncomfortable with anyone in your familyo No Has anyone forced you do things that you didnt want to doo No Electronic Signature(s) Signed: 06/04/2021 6:20:17 PM By: [name] Abts RN, BSN Entered By: [name] on 06/04/2021 14:12:04 -------------------------------------------------------------------------------- Activities of Daily Living Details Patient Name: Date of Service: [name] 06/04/2021 1:15 PM Medical Record Number: [id number] Patient Account Number: [id number] Date of Birth/Sex: Treating RN: 1961-01-20 (60 y.o. [name] Primary Care [name]: [name] Other Clinician: Referring [name]: Treating [name]/Extender: [name] in Treatment: 0 Activities of Daily Living Items Answer Activities of Daily Living (Please select one for each item) Drive Automobile Not Able T Medications ake Completely Able Use T elephone Completely Able Care for Appearance Completely Able Use T oilet Completely Able Bath / Shower Completely Able Dress Self Completely Able Feed Self Completely Able Walk Completely Able Get In / Out Bed Completely Able Housework Need Assistance Prepare Meals Completely Able Handle Money Completely Able Shop for Self Need Assistance Electronic Signature(s) Signed: 06/04/2021 6:20:17 PM By: [name] Abts RN, BSN Entered By: [name]  on 06/04/2021 14:12:28 -------------------------------------------------------------------------------- Education Screening Details Patient Name: Date of Service: [name] 06/04/2021 1:15 PM Medical Record Number: [id number] Patient Account Number: [id number] Date of Birth/Sex: Treating RN: 17-Feb-1961 (60 y.o. [name] Primary Care Lerry Cordrey: [name] Other Clinician: Referring Governor Matos: Treating [name]/Extender: [name] in Treatment: 0 Primary Learner Assessed: Patient Learning Preferences/Education Level/Primary Language Learning Preference: Explanation, Demonstration, Printed Material Highest Education Level: College or Above Preferred Language: English Cognitive Barrier Language Barrier: No Translator Needed: No Memory Deficit: No Emotional Barrier: No Cultural/Religious Beliefs Affecting Medical Care: No Physical Barrier Impaired Vision: No Impaired Hearing: No Decreased Hand dexterity: No Knowledge/Comprehension Knowledge Level: High Comprehension Level: High Ability to understand written instructions: High Ability to understand verbal instructions: High Motivation Anxiety Level: Calm Cooperation: Cooperative Education Importance: Acknowledges Need Interest in Health Problems: Asks Questions Perception: Coherent Willingness to Engage in Self-Management High Activities: Readiness to Engage in Self-Management High Activities: Electronic Signature(s) Signed: 06/04/2021 6:20:17 PM By: [name] Abts RN, BSN Entered By: [name] on 06/04/2021 14:12:57 -------------------------------------------------------------------------------- Fall Risk Assessment Details Patient Name: Date of Service: [name] 06/04/2021 1:15 PM Medical Record Number: [id number] Patient Account Number: [id number] Date of Birth/Sex: Treating RN: [birth year] (60 y.o. [name] Primary Care Aijah Lattner: [name] Other  Clinician: Referring [name]: Treating [name]/Extender: [name] in Treatment: 0 Fall Risk Assessment Items Have you had 2 or more falls in the last 12 monthso 0 No Have you had any fall that resulted in injury in the last 12 monthso 0 No FALLS RISK SCREEN History of falling - immediate or within 3 months 0 No Secondary diagnosis (Do you have 2 or more medical diagnoseso) 15 Yes Ambulatory aid None/bed rest/wheelchair/nurse 0 No Crutches/cane/walker 15 Yes Furniture 0 No Intravenous therapy Access/Saline/Heparin Lock 0 No Gait/Transferring Normal/ bed rest/ wheelchair 0 No Weak (short steps with or without shuffle, stooped  but able to lift head while walking, may seek 0 No support from furniture) Impaired (short steps with shuffle, may have difficulty arising from chair, head down, impaired 0 No balance) Mental Status Oriented to own ability 0 Yes Electronic Signature(s) Signed: 06/04/2021 6:20:17 PM By: [name] Abts RN, BSN Entered By: [name] on 06/04/2021 14:13:12 -------------------------------------------------------------------------------- Foot Assessment Details Patient Name: Date of Service: [name] 06/04/2021 1:15 PM Medical Record Number: [id number] Patient Account Number: [id number] Date of Birth/Sex: Treating RN: 1961-07-06 (60 y.o. [name] Primary Care [name]: [name] Other Clinician: Referring [name]: Treating [name]/Extender: [name] in Treatment: 0 Foot Assessment Items Site Locations + = Sensation present, - = Sensation absent, C = Callus, U = Ulcer R = Redness, W = Warmth, M = Maceration, PU = Pre-ulcerative lesion F = Fissure, S = Swelling, D = Dryness Assessment Right: Left: Other Deformity: No No Prior Foot Ulcer: No No Prior Amputation: No No Charcot Joint: No No Ambulatory Status: Ambulatory With Help Gait: Steady Electronic Signature(s) Signed:  06/04/2021 6:20:17 PM By: [name] Abts RN, BSN Entered By: [name] on 06/04/2021 14:14:27 -------------------------------------------------------------------------------- Nutrition Risk Screening Details Patient Name: Date of Service: [name] 06/04/2021 1:15 PM Medical Record Number: [id number] Patient Account Number: [id number] Date of Birth/Sex: Treating RN: 1961-05-19 (60 y.o. [name] Primary Care Judson Tsan: [name] Other Clinician: Referring Nyjah Denio: Treating Yazmyn Valbuena/Extender: [name] in Treatment: 0 Height (in): 73 Weight (lbs): 270 Body Mass Index (BMI): 35.6 Nutrition Risk Screening Items Score Screening NUTRITION RISK SCREEN: I have an illness or condition that made me change the kind and/or amount of food I eat 2 Yes I eat fewer than two meals per day 0 No I eat few fruits and vegetables, or milk products 0 No I have three or more drinks of beer, liquor or wine almost every day 0 No I have tooth or mouth problems that make it hard for me to eat 0 No I don't always have enough money to buy the food I need 0 No I eat alone most of the time 0 No I take three or more different prescribed or over-the-counter drugs a day 1 Yes Without wanting to, I have lost or gained 10 pounds in the last six months 0 No I am not always physically able to shop, cook and/or feed myself 0 No Nutrition Protocols Good Risk Protocol Moderate Risk Protocol 0 Provide education on nutrition High Risk Proctocol Risk Level: Moderate Risk Score: 3 Electronic Signature(s) Signed: 06/04/2021 6:20:17 PM By: [name] Abts RN, BSN Entered By: [name] on 06/04/2021 14:13:22

## 2021-06-04 NOTE — Progress Notes (Signed)
Fanny BienMILLER, Lori ([id number]) Visit Report for 06/04/2021 Allergy List Details Patient Name: Date of Service: Benjaman KindlerMILLER, [name] 06/04/2021 1:15 PM Medical Record Number: [id number] Patient Account Number: [id number] Date of Birth/Sex: Treating RN: 08/07/1961 (60 y.o. Elizebeth KollerM) Lynch, Shatara Primary Care Catarina Huntley: Cheryll CockayneBurns, Stacy Other Clinician: Referring Liadan Guizar: Treating Catilyn Boggus/Extender: Ranell Patrickobson, Michael Burns, Stacy Weeks in Treatment: 0 Allergies Active Allergies Claritin Reaction: anxiety Severity: Moderate Allergy Notes Electronic Signature(s) Signed: 06/04/2021 6:20:17 PM By: Zandra AbtsLynch, Shatara RN, BSN Entered By: Zandra AbtsLynch, Shatara on 06/04/2021 14:04:19 -------------------------------------------------------------------------------- Arrival Information Details Patient Name: Date of Service: Lynden OxfordMILLER, [name] 06/04/2021 1:15 PM Medical Record Number: [id number] Patient Account Number: [id number] Date of Birth/Sex: Treating RN: 08/09/1961 (60 y.o. [name]) Deaton, Bobbi Primary Care Taelor Waymire: Cheryll CockayneBurns, Stacy Other Clinician: Referring Elizah Mierzwa: Treating Halee Glynn/Extender: Avie Echevariaobson, Michael Burns, Stacy Weeks in Treatment: 0 Visit Information Patient Arrived: Wheel Chair Arrival Time: 13:27 Accompanied By: wife Transfer Assistance: Manual Patient Identification Verified: Yes Secondary Verification Process Completed: Yes Patient Requires Transmission-Based Precautions: No Patient Has Alerts: Yes Patient Alerts: L ABI: 0.99 R ABI: 1.05 History Since Last Visit Added or deleted any medications: No Any new allergies or adverse reactions: No Had a fall or experienced change in activities of daily living that may affect risk of falls: No Signs or symptoms of abuse/neglect since last visito No Hospitalized since last visit: No Implantable device outside of the clinic excluding cellular tissue based products placed in the center since last visit: No Pain Present Now: No Electronic Signature(s) Signed:  06/04/2021 6:20:17 PM By: Zandra AbtsLynch, Shatara RN, BSN Entered By: Zandra AbtsLynch, Shatara on 06/04/2021 14:02:04 -------------------------------------------------------------------------------- Clinic Level of Care Assessment Details Patient Name: Date of Service: Lynden OxfordMILLER, [name] 06/04/2021 1:15 PM Medical Record Number: [id number] Patient Account Number: [id number] Date of Birth/Sex: Treating RN: 10/23/1960 (60 y.o. Elizebeth KollerM) Lynch, Shatara Primary Care Rennee Coyne: Cheryll CockayneBurns, Stacy Other Clinician: Referring Varina Hulon: Treating Dakwon Wenberg/Extender: Avie Echevariaobson, Michael Burns, Stacy Weeks in Treatment: 0 Clinic Level of Care Assessment Items TOOL 2 Quantity Score X- 1 0 Use when only an EandM is performed on the INITIAL visit ASSESSMENTS - Nursing Assessment / Reassessment X- 1 20 General Physical Exam (combine w/ comprehensive assessment (listed just below) when performed on new pt. evals) X- 1 25 Comprehensive Assessment (HX, ROS, Risk Assessments, Wounds Hx, etc.) ASSESSMENTS - Wound and Skin A ssessment / Reassessment X - Simple Wound Assessment / Reassessment - one wound 1 5 []  - 0 Complex Wound Assessment / Reassessment - multiple wounds []  - 0 Dermatologic / Skin Assessment (not related to wound area) ASSESSMENTS - Ostomy and/or Continence Assessment and Care []  - 0 Incontinence Assessment and Management []  - 0 Ostomy Care Assessment and Management (repouching, etc.) PROCESS - Coordination of Care X - Simple Patient / Family Education for ongoing care 1 15 []  - 0 Complex (extensive) Patient / Family Education for ongoing care X- 1 10 Staff obtains ChiropractorConsents, Records, T Results / Process Orders est []  - 0 Staff telephones HHA, Nursing Homes / Clarify orders / etc []  - 0 Routine Transfer to another Facility (non-emergent condition) []  - 0 Routine Hospital Admission (non-emergent condition) X- 1 15 New Admissions / Manufacturing engineernsurance Authorizations / Ordering NPWT Apligraf, etc. , []  - 0 Emergency Hospital  Admission (emergent condition) X- 1 10 Simple Discharge Coordination []  - 0 Complex (extensive) Discharge Coordination PROCESS - Special Needs []  - 0 Pediatric / Minor Patient Management []  - 0 Isolation Patient Management []  - 0 Hearing / Language / Visual special needs []  - 0 Assessment of  Community assistance (transportation, D/C planning, etc.)  - 0 Additional assistance / Altered mentation  - 0 Support Surface(s) Assessment (bed, cushion, seat, etc.) INTERVENTIONS - Wound Cleansing / Measurement X- 1 5 Wound Imaging (photographs - any number of wounds)  - 0 Wound Tracing (instead of photographs) X- 1 5 Simple Wound Measurement - one wound  - 0 Complex Wound Measurement - multiple wounds X- 1 5 Simple Wound Cleansing - one wound  - 0 Complex Wound Cleansing - multiple wounds INTERVENTIONS - Wound Dressings  - 0 Small Wound Dressing one or multiple wounds X- 1 15 Medium Wound Dressing one or multiple wounds  - 0 Large Wound Dressing one or multiple wounds  - 0 Application of Medications - injection INTERVENTIONS - Miscellaneous  - 0 External ear exam  - 0 Specimen Collection (cultures, biopsies, blood, body fluids, etc.)  - 0 Specimen(s) / Culture(s) sent or taken to Lab for analysis  - 0 Patient Transfer (multiple staff / Nurse, adult / Similar devices)  - 0 Simple Staple / Suture removal (25 or less)  - 0 Complex Staple / Suture removal (26 or more)  - 0 Hypo / Hyperglycemic Management (close monitor of Blood Glucose)  - 0 Ankle / Brachial Index (ABI) - do not check if billed separately Has the patient been seen at the hospital within the last three years: Yes Total Score: 130 Level Of Care: New/Established - Level 4 Electronic Signature(s) Signed: 06/04/2021 6:20:17 PM By: Zandra Abts RN, BSN Entered By: Zandra Abts on 06/04/2021  14:36:07 -------------------------------------------------------------------------------- Encounter Discharge Information Details Patient Name: Date of Service: [name] 06/04/2021 1:15 PM Medical Record Number: [id number] Patient Account Number: [id number] Date of Birth/Sex: Treating RN: Sep 04, 1961 (60 y.o. [name] Primary Care Shundra Wirsing: [name] Other Clinician: Referring Juel Ripley: Treating Debbrah Sampedro/Extender: Avie Echevaria in Treatment: 0 Encounter Discharge Information Items Discharge Condition: Stable Ambulatory Status: Wheelchair Discharge Destination: Home Transportation: Private Auto Accompanied By: spouse Schedule Follow-up Appointment: Yes Clinical Summary of Care: Patient Declined Electronic Signature(s) Signed: 06/04/2021 6:20:17 PM By: Zandra Abts RN, BSN Entered By: Zandra Abts on 06/04/2021 15:51:02 -------------------------------------------------------------------------------- Lower Extremity Assessment Details Patient Name: Date of Service: KYEL, PURK IG 06/04/2021 1:15 PM Medical Record Number: [id number] Patient Account Number: [id number] Date of Birth/Sex: Treating RN: [birth year] (60 y.o. [name] Primary Care Josslyn Ciolek: [name] Other Clinician: Referring Lyndia Bury: Treating Billye Pickerel/Extender: Avie Echevaria in Treatment: 0 Edema Assessment Assessed: [name]: No] [Right: No] Edema: [Left: Ye] [Right: s] Calf Left: Right: Point of Measurement: From Medial Instep 42 cm Ankle Left: Right: Point of Measurement: From Medial Instep 25 cm Knee To Floor Left: Right: From Medial Instep 44 cm Vascular Assessment Pulses: Dorsalis Pedis Palpable: [Left:Yes] Electronic Signature(s) Signed: 06/04/2021 6:20:17 PM By: Zandra Abts RN, BSN Entered By: Zandra Abts on 06/04/2021 14:14:52 -------------------------------------------------------------------------------- Multi Wound Chart  Details Patient Name: Date of Service: [name] 06/04/2021 1:15 PM Medical Record Number: [id number] Patient Account Number: [id number] Date of Birth/Sex: Treating RN: 22-Jun-1961 (60 y.o. [name] Primary Care Djuan Talton: [name] Other Clinician: Referring Aubrei Bouchie: Treating Mattheus Rauls/Extender: Avie Echevaria in Treatment: 0 Vital Signs Height(in): 73 Pulse(bpm): 90 Weight(lbs): 270 Blood Pressure(mmHg): 153/93 Body Mass Index(BMI): 36 Temperature(F): 97.7 Respiratory Rate(breaths/min): 16 Photos: [N/A:N/A] Left, Plantar Foot N/A N/A Wound Location: Gradually Appeared N/A N/A Wounding Event: Diabetic Wound/Ulcer of the Lower N/A N/A Primary Etiology: Extremity Anemia, Sleep Apnea, Congestive N/A N/A Comorbid History: Heart Failure, Hypertension,  Peripheral Venous Disease, Cirrhosis , Type II Diabetes, Osteoarthritis, Neuropathy 03/18/2021 N/A N/A Date Acquired: 0 N/A N/A Weeks of Treatment: Open N/A N/A Wound Status: 2.5x3.6x0.1 N/A N/A Measurements L x W x D (cm) 7.069 N/A N/A A (cm) : rea 0.707 N/A N/A Volume (cm) : 0.00% N/A N/A % Reduction in A rea: 0.00% N/A N/A % Reduction in Volume: Grade 3 N/A N/A Classification: MRI N/A N/A [address] Verification: Medium N/A N/A Exudate A mount: Serosanguineous N/A N/A Exudate Type: red, brown N/A N/A Exudate Color: Flat and Intact N/A N/A Wound Margin: Large (67-100%) N/A N/A Granulation A mount: Red N/A N/A Granulation Quality: None Present (0%) N/A N/A Necrotic A mount: Fat Layer (Subcutaneous Tissue): Yes N/A N/A Exposed Structures: Fascia: No Tendon: No Muscle: No Joint: No Bone: No None N/A N/A Epithelialization: Treatment Notes Electronic Signature(s) Signed: 06/04/2021 4:52:40 PM By: Baltazar Najjar MD Signed: 06/04/2021 6:11:30 PM By: Shawn Stall Entered By: Baltazar Najjar on 06/04/2021  14:50:08 -------------------------------------------------------------------------------- Multi-Disciplinary Care Plan Details Patient Name: Date of Service: KAYSEN, DEAL IG 06/04/2021 1:15 PM Medical Record Number: [id number] Patient Account Number: [id number] Date of Birth/Sex: Treating RN: November 20, 1960 (60 y.o. [name] Primary Care Kryslyn Helbig: [name] Other Clinician: Referring Jamonte Curfman: Treating Zakery Normington/Extender: Avie Echevaria in Treatment: 0 Multidisciplinary Care Plan reviewed with physician Active Inactive HBO Nursing Diagnoses: Anxiety related to feelings of confinement associated with the hyperbaric oxygen chamber Anxiety related to knowledge deficit of hyperbaric oxygen therapy and treatment procedures Discomfort related to temperature and humidity changes inside hyperbaric chamber Potential for barotraumas to ears, sinuses, teeth, and lungs or cerebral gas embolism related to changes in atmospheric pressure inside hyperbaric oxygen chamber Potential for oxygen toxicity seizures related to delivery of 100% oxygen at an increased atmospheric pressure Potential for pulmonary oxygen toxicity related to delivery of 100% oxygen at an increased atmospheric pressure Goals: Barotrauma will be prevented during HBO2 Date Initiated: 06/04/2021 Target Resolution Date: 08/14/2021 Goal Status: Active Patient and/or family will be able to state/discuss factors appropriate to the management of their disease process during treatment Date Initiated: 06/04/2021 Target Resolution Date: 08/14/2021 Goal Status: Active Patient will tolerate the hyperbaric oxygen therapy treatment Date Initiated: 06/04/2021 T arget Resolution Date: 08/14/2021 Goal Status: Active Patient will tolerate the internal climate of the chamber Date Initiated: 06/04/2021 T arget Resolution Date: 08/14/2021 Goal Status: Active Patient/caregiver will verbalize understanding of HBO goals,  rationale, procedures and potential hazards Date Initiated: 06/04/2021 T arget Resolution Date: 08/14/2021 Goal Status: Active Signs and symptoms of pulmonary oxygen toxicity will be recognized and promptly addressed Date Initiated: 06/04/2021 T arget Resolution Date: 08/14/2021 Goal Status: Active Signs and symptoms of seizure will be recognized and promptly addressed ; seizing patients will suffer no harm Date Initiated: 06/04/2021 T arget Resolution Date: 08/14/2021 Goal Status: Active Interventions: Administer a five (5) minute air break for patient if signs and symptoms of seizure appear and notify the hyperbaric physician Administer decongestants, per physician orders, prior to HBO2 Administer the correct therapeutic gas delivery based on the patients needs and limitations, per physician order Assess and provide for patients comfort related to the hyperbaric environment and equalization of middle ear Assess for signs and symptoms related to adverse events, including but not limited to confinement anxiety, pneumothorax, oxygen toxicity and baurotrauma Assess patient for any history of confinement anxiety Assess patient's knowledge and expectations regarding hyperbaric medicine and provide education related to the hyperbaric environment, goals of treatment and prevention of adverse events Implement  protocols to decrease risk of pneumothorax in high risk patients Notes: Abuse / Safety / Falls / Self Care Management Nursing Diagnoses: Potential for falls Potential for injury related to falls Goals: Patient will not experience any injury related to falls Date Initiated: 06/04/2021 Target Resolution Date: 07/03/2021 Goal Status: Active Patient/caregiver will verbalize/demonstrate measures taken to prevent injury and/or falls Date Initiated: 06/04/2021 Target Resolution Date: 07/03/2021 Goal Status: Active Interventions: Assess Activities of Daily Living upon admission and as  needed Assess fall risk on admission and as needed Assess: immobility, friction, shearing, incontinence upon admission and as needed Assess impairment of mobility on admission and as needed per policy Assess personal safety and home safety (as indicated) on admission and as needed Provide education on fall prevention Provide education on personal and home safety Notes: Nutrition Nursing Diagnoses: Impaired glucose control: actual or potential Potential for alteratiion in Nutrition/Potential for imbalanced nutrition Goals: Patient/caregiver agrees to and verbalizes understanding of need to use nutritional supplements and/or vitamins as prescribed Date Initiated: 06/04/2021 Target Resolution Date: 07/03/2021 Goal Status: Active Patient/caregiver will maintain therapeutic glucose control Date Initiated: 06/04/2021 Target Resolution Date: 07/03/2021 Goal Status: Active Interventions: Assess HgA1c results as ordered upon admission and as needed Assess patient nutrition upon admission and as needed per policy Provide education on elevated blood sugars and impact on wound healing Notes: Wound/Skin Impairment Nursing Diagnoses: Impaired tissue integrity Knowledge deficit related to ulceration/compromised skin integrity Goals: Patient/caregiver will verbalize understanding of skin care regimen Date Initiated: 06/04/2021 Target Resolution Date: 07/03/2021 Goal Status: Active Interventions: Assess patient/caregiver ability to obtain necessary supplies Assess patient/caregiver ability to perform ulcer/skin care regimen upon admission and as needed Assess ulceration(s) every visit Provide education on ulcer and skin care Notes: Electronic Signature(s) Signed: 06/04/2021 6:20:17 PM By: Zandra Abts RN, BSN Entered By: Zandra Abts on 06/04/2021 14:32:29 -------------------------------------------------------------------------------- Pain Assessment Details Patient Name: Date of  Service: LEJON, AFZAL IG 06/04/2021 1:15 PM Medical Record Number: [id number] Patient Account Number: [id number] Date of Birth/Sex: Treating RN: 11/16/1960 (60 y.o. [name] Primary Care Azilee Pirro: [name] Other Clinician: Referring Ermagene Saidi: Treating Katya Rolston/Extender: Avie Echevaria in Treatment: 0 Active Problems Location of Pain Severity and Description of Pain Patient Has Paino No Site Locations Pain Management and Medication Current Pain Management: Electronic Signature(s) Signed: 06/04/2021 6:20:17 PM By: Zandra Abts RN, BSN Entered By: Zandra Abts on 06/04/2021 14:15:03 -------------------------------------------------------------------------------- Patient/Caregiver Education Details Patient Name: Date of Service: Benjaman Kindler 8/18/2022andnbsp1:15 PM Medical Record Number: [id number] Patient Account Number: [id number] Date of Birth/Gender: Treating RN: 1961/06/12 (60 y.o. [name] Primary Care Physician: [name] Other Clinician: Referring Physician: Treating Physician/Extender: Avie Echevaria in Treatment: 0 Education Assessment Education Provided To: Patient Education Topics Provided Elevated Blood Sugar/ Impact on Healing: Methods: Explain/Verbal Responses: State content correctly Hyperbaric Oxygenation: Handouts: Hyperbaric Oxygen Methods: Explain/Verbal, Printed Responses: State content correctly Safety: Methods: Explain/Verbal Responses: State content correctly Wound/Skin Impairment: Handouts: Caring for Your Ulcer Methods: Explain/Verbal, Printed Responses: State content correctly Electronic Signature(s) Signed: 06/04/2021 6:20:17 PM By: Zandra Abts RN, BSN Entered By: Zandra Abts on 06/04/2021 14:34:47 -------------------------------------------------------------------------------- Wound Assessment Details Patient Name: Date of Service: [name] 06/04/2021 1:15  PM Medical Record Number: [id number] Patient Account Number: [id number] Date of Birth/Sex: Treating RN: 12-02-1960 (60 y.o. [name] Primary Care Bobbyjo Marulanda: [name] Other Clinician: Referring Joash Tony: Treating Caitlynn Ju/Extender: Avie Echevaria in Treatment: 0 Wound Status Wound Number: 3 Primary Diabetic Wound/Ulcer of the Lower Extremity  Etiology: Wound Location: Left, Plantar Foot Wound Open Wounding Event: Gradually Appeared Status: Date Acquired: 03/18/2021 Comorbid Anemia, Sleep Apnea, Congestive Heart Failure, Hypertension, Weeks Of Treatment: 0 History: Peripheral Venous Disease, Cirrhosis , Type II Diabetes, Clustered Wound: No Osteoarthritis, Neuropathy Photos Wound Measurements Length: (cm) 2.5 Width: (cm) 3.6 Depth: (cm) 0.1 Area: (cm) 7.069 Volume: (cm) 0.707 % Reduction in Area: 0% % Reduction in Volume: 0% Epithelialization: None Tunneling: No Undermining: No Wound Description Classification: Grade 3 Wagner Verification: MRI Wound Margin: Flat and Intact Exudate Amount: Medium Exudate Type: Serosanguineous Exudate Color: red, brown Foul Odor After Cleansing: No Slough/Fibrino No Wound Bed Granulation Amount: Large (67-100%) Exposed Structure Granulation Quality: Red Fascia Exposed: No Necrotic Amount: None Present (0%) Fat Layer (Subcutaneous Tissue) Exposed: Yes Tendon Exposed: No Muscle Exposed: No Joint Exposed: No Bone Exposed: No Treatment Notes Wound #3 (Foot) Wound Laterality: Plantar, Left Cleanser Soap and Water Discharge Instruction: May shower and wash wound with dial antibacterial soap and water prior to dressing change. Wound Cleanser Discharge Instruction: Cleanse the wound with wound cleanser prior to applying a clean dressing using gauze sponges, not tissue or cotton balls. Peri-Wound Care Topical Primary Dressing MediHoney Calcium Alginate Dressing 4x5 in Discharge Instruction: Apply to  wound bed as instructed Secondary Dressing Woven Gauze Sponge, Non-Sterile 4x4 in Discharge Instruction: Apply over primary dressing as directed. ABD Pad, 5x9 Discharge Instruction: Apply over primary dressing as directed. Secured With [employer], 4.5x3.1 (in/yd) Discharge Instruction: Secure with Kerlix as directed. 2M Medipore H Soft Cloth Surgical T ape, 2x2 (in/yd) Discharge Instruction: Secure dressing with tape as directed. Compression Wrap Compression Stockings Add-Ons Electronic Signature(s) Signed: 06/04/2021 6:11:30 PM By: Shawn Stall Signed: 06/04/2021 6:20:17 PM By: Zandra Abts RN, BSN Entered By: Zandra Abts on 06/04/2021 14:28:14 -------------------------------------------------------------------------------- Vitals Details Patient Name: Date of Service: AKILI, CORSETTI IG 06/04/2021 1:15 PM Medical Record Number: [id number] Patient Account Number: [id number] Date of Birth/Sex: Treating RN: [birth year] (60 y.o. [name] Primary Care Olney Monier: [name] Other Clinician: Referring Krissy Orebaugh: Treating Jivan Symanski/Extender: Avie Echevaria in Treatment: 0 Vital Signs Time Taken: 13:28 Temperature (F): 97.7 Height (in): 73 Pulse (bpm): 90 Source: Stated Respiratory Rate (breaths/min): 16 Weight (lbs): 270 Blood Pressure (mmHg): 153/93 Source: Stated Reference Range: 80 - 120 mg / dl Body Mass Index (BMI): 35.6 Electronic Signature(s) Signed: 06/04/2021 6:20:17 PM By: Zandra Abts RN, BSN Entered By: Zandra Abts on 06/04/2021 14:02:16

## 2021-06-04 NOTE — Progress Notes (Addendum)
[name], [name] ([id number]) Visit Report for 06/04/2021 HPI Details Patient Name: Date of Service: [name], [name] 06/04/2021 1:15 PM Medical Record Number: [id number] Patient Account Number: [id number] Date of Birth/Sex: Treating RN: 1960/11/13 (60 y.o. [name] Primary Care Provider: Billey Gosling Other Clinician: Referring Provider: Treating Provider/Extender: Genella Rife in Treatment: 0 History of Present Illness HPI Description: 10/31/2019 upon evaluation today patient appears to be doing somewhat poorly in regard to his bilateral plantar feet. He has wounds that he tells me have been present since 2012 intermittently off and on. Most recently this has been open for at least the past 6 months to a year. He has been trying to treat this in different ways using Santyl along with various other dressings including Medihoney and even at one point Xeroform. Nothing really has seem to get this completely closed. He was recently in the hospital for cellulitis of his leg subsequently he did have x-rays as well as MRIs that showed negative for any signs of osteomyelitis in regard to the wounds on his feet. Fortunately there is no signs of systemic infection at this time. No fevers, chills, nausea, vomiting, or diarrhea. Patient has previously used Darco offloading shoes as far as frontal floaters as well as postop surgical shoes. He has never been in a total contact cast that may be something we need to strongly consider here. Patient's most recent hemoglobin A1c 1 month ago was 5.3 seems to be very well controlled which is great. Subsequently he has seen vascular as well as podiatry. His ABIs are 1.07 on the left and 1.14 on the right he seems to be doing well he does have chronic venous stasis. 11/07/2019 upon evaluation today patient appears to be doing well with regard to his wounds all things considered. I do not see any severe worsening he still has some callus buildup  on the right more than the left he notes that he has been probably more active than he should as far as walking is concerned is just very hard to not be active. He knows he needs to be more careful in this regard however. He is willing to give the cast a try at this point although he notes that he is a little nervous about this just with regard to balance although he will be very careful and obviously if he has any trouble he knows to contact the office and let me know. 1/22; patient is in for his obligatory first total contact cast change. Our intake nurse reported a very large amount of drainage which is spelled out over to the surrounding skin. Has bilateral diabetic foot wounds. He has Charcot feet. We have been using silver alginate on his wounds. 11/14/2019 on evaluation today patient is actually seeming to make good improvement in regard to his bilateral plantar foot wounds. We have been using a cast on the left side and on the right side he has been using dressings he is changing up his own accord. With that being said he tells me that he is also not walking as much just due to how unsteady he feels. He takes it easy when he does have to walk and when he does not have to walk he is resting. This is probably help in his right foot as well has the left foot which is actually measuring better. In fact both are measuring better. Overall I am very pleased with how things seem to be progressing. The patient does have some  odor on the left foot this does have me concerned about the possibility of infection, and actually probably go ahead and put him on antibiotics today as well as utilizing a continuation of the cast on the left foot I think that will be fine we probably just need to bring him in sooner to change this not last a whole week. 1/29; we brought the patient back today for a total contact cast change on the left out of concern for excessive drainage. We are using drawtex over the wound as  the primary dressing 11/21/2019 on evaluation today patient appears to be doing well with regard to his left plantar foot. In fact both foot ulcers actually seem to be doing pretty well. Nonetheless he is having a lot of drainage on the left at this time and again we did obtain a wound culture did show positive for Staph aureus that was reviewed by myself today as well. Nonetheless he is on Bactrim which was shown to be sensitive that should be helping in this regard. Fortunately there is no signs of infection systemically at this point. 2/5; back in clinic today for a total contact cast change apparently secondary to very significant drainage. Still using drawtex 11/28/2019 upon evaluation today patient appears to be doing well with regard to his wounds. The right foot is doing okay as measured about the same in my opinion. The left foot is actually showing signs of significant improvement is measuring smaller there is a lot of hyper granulation likely due to the continued drainage at this point. We did obtain approval for a snap VAC I think that is good to be appropriate for him and will likely help this tremendously underneath the cast. He is definitely in agreement with proceeding with such. 2/12; patient came in today after his snap VAC lost suction. Brought in to see one of our nurses. The dressing was replaced and then we put the cast back on and rehooked up the snap VAC. Apparently his wound looked very good per our intake nurse. 2/15; again we replaced the cast on Friday. By Saturday the snap VAC and light suction. He called this morning he comes in acutely. The wounds look fine however the VAC is not functioning. We replaced the cast using silver alginate as the primary dressing backed with Kerramax. The snap VAC was not replaced 12/05/2019 upon evaluation today patient appears to be doing better in regard to his left plantar foot ulcer. Fortunately there is no signs of active infection at this  time. Unfortunately he is continuing to have issues with the right foot he is really not making any progress here things seem to be somewhat stagnant to be honest. The depth has increased but that is due to me having debrided the wound in the past based on what I am seeing. 12/12/2019 on evaluation today patient appears to be doing more poorly in regard to the left lower extremity. He has some erythema spreading up the side of his foot I am concerned about infection again at this point. Unfortunately he has been seeing improvement with a total contact cast but I do not think we should put that on today. On his right plantar foot he continues to have significant drainage this is actually measuring deeper I really do not feel like you are making any progress whatsoever. I have prescribed Granix for him unfortunately his insurance apparently was going to cost him a $500 co-pay. 12/19/2019 upon evaluation today patient actually appears to  be doing better in regard to both wounds. With that being said he actually did get the reGranix which he had to pay $500 for. With that being said it does look like that he is actually made some improvement based on what I am seeing at this point with the reGranix. Obviously if he is going to continue this we are going to do something about trying to get him some help in covering the cost. 12/26/2019 on evaluation today patient appears to be doing really much better even compared to last week. Overall the wound seems to be much better even compared to last week and last week was better than the week before. Since has been using the reGranix his symptoms have improved significantly. With that being said the issue right now is simply that this is a very expensive medication for him the first dose cost him $500. Upon inspection patient's wound bed actually is however dramatically improved compared to before he started this 2 weeks ago. 01/02/2020 upon evaluation today patient  appears to actually be doing well. He still had a little bit of reGranix left that has been using in small amounts he just been applying it every other day instead of every day in order to make it go longer. Overall we are still seeing excellent improvement he is measuring smaller looking better healthier tissue and everything seems to be pointing to this headed in the right direction. Fortunately there is no evidence of infection either which is also excellent news. He does have his MRI coming up within the next week. 01/09/2020 upon evaluation today patient appears to be doing a little worse today compared to previous week's evaluation. He is actually been out of the reGranix at this point. He has been trying to make the stretch out so he has been changing the dressings on a regular set schedule like he was previous. I think this has made a difference. Fortunately there is no signs of active infection at this time. No fevers, chills, nausea, vomiting, or diarrhea. 01/16/2020 upon evaluation today patient appears to be doing well with regard to his left plantar foot ulcer. The right plantar foot still shows some significant depth at this point. Fortunately there is no signs of active infection at this time. 01/30/2020 upon evaluation today patient appears to be doing about the same in regard to his right plantar foot ulcer there is still some depth here and we had to wait till he actually switched over to his new insurance to get approval for the MRI under his new insurance plan. With that being said he now has switched as of April 1. Fortunately there is no signs of active infection at this time. Overall in regard to his left foot ulcer this seems to be doing much better and I am actually very pleased with how things are going. With that being said it is not quite as much progress as we were seeing with the reGranix but at the same time he has had trouble getting this apparently there is been some  hindrance here. I Georgina Peer try to actually send this to [facility] that was recommended by the drug rep to me. 02/13/20 upon evaluation today patient appears to be doing better in regard to his left foot ulcer this is great news. Unfortunately the right foot ulcer is not really significantly better at this time. There is no signs of active infection systemically though he did have his MRI which showed unfortunately he does have  infection noted including an abscess in the foot. There is also marrow changes noted which are consistent with osteomyelitis based on the radiology review and interpretation. Unfortunately considering that the wound is not really making the progress that we will he would like to be seen I think that this is an indication that he may need some further referral both infectious disease as well as potentially to podiatrist to see if there is anything that can be done to help with the situation that were dealing with here. The left foot again is doing great. READMISSION 06/04/2021 This is a 60 year old man who was in the clinic in 2021 followed by Jeri Cos for areas on the right and left foot. He developed a left foot infection and was referred to ID. He left the clinic in a nonhealed state and was followed for a period of time [facility] Cannon Kettle. Apparently things really deteriorated in early July when he was admitted to hospital from 04/27/2021 through 05/06/2021 with sepsis secondary to a left foot infection. His blood cultures were negative. An MRI suggested fifth metatarsal osteomyelitis a left ankle septic joint. He was treated with vancomycin and ertapenem which he is still taking and may just about be finishing. He was seen by orthopedics and the patient adamantly refused to BKA. As far as I can tell he did not have the ankle aspirated I am not exactly sure what the issue was here. Since he has been discharged she is at [facility] for  rehabilitation. He was last seen by Dr. Drucilla Schmidt on 05/20/2021 he noted osteo of the tibial talar bone cuboid and fifth metatarsal which is even more extensive than what was suggested by the MRI. He is apparently going for a consultation with orthopedic surgery in [place] sometime next week. I received a call about this man 2 weeks ago from Dr. Gwenlyn Found who follows him for the possibility of PAD. ABIs I think done in the office showed a ABI on the right of 1.05 at the PTA and 0.99 at the PTA on the left. He had a DVT rule out in the left leg that was negative for the DVT. Past medical history is extensive and includes diastolic heart failure, right first metatarsal head ulcer in 2021, excision of the right second ray by Dr. Cannon Kettle on 03/13/2020, hypertension, hypothyroidism. Left total hip replacement, right total knee replacement, carpal tunnel syndrome, obstructive sleep apnea alcohol abuse with cirrhosis although the patient denies current alcohol intake. The patient does not think he is a diabetic however looking through [facility] I see 2 ABIs of this year that were greater than equal to 6.5 which by definition makes him diabetic. Nevertheless he is not on any treatment and does not check his blood sugars. Electronic Signature(s) Signed: 06/04/2021 4:52:40 PM By: Linton Ham MD Entered By: Linton Ham on 06/04/2021 15:03:34 -------------------------------------------------------------------------------- Physical Exam Details Patient Name: Date of Service: DARROW, BARREIRO IG 06/04/2021 1:15 PM Medical Record Number: [id number] Patient Account Number: [id number] Date of Birth/Sex: Treating RN: Mar 03, 1961 (60 y.o. [name] Primary Care Provider: Billey Gosling Other Clinician: Referring Provider: Treating Provider/Extender: Genella Rife in Treatment: 0 Constitutional Patient is hypertensive.. Pulse regular and within target range for patient.Marland Kitchen Respirations  regular, non-labored and within target range.. Temperature is normal and within the target range for the patient.Marland Kitchen Appears in no distress. Respiratory work of breathing is normal. Bilateral breath sounds are clear and equal in all lobes with  no wheezes, rales or rhonchi.. Cardiovascular Heart rhythm and rate regular, without murmur or gallop.. Pulses are easily palpable on the left at both the dorsalis pedis and posterior tibial. He has some degree of chronic venous insufficiency in the left leg. Musculoskeletal Surprisingly no obvious effusion in the left ankle. Neurological Markedly reduced sensation on the left foot. Notes Wound exam; the area in question is in the left mid plantar foot slightly to the lateral aspect. This is a clean surface wound looks healthy. There is no depth certainly no exposed bone no surrounding infection. He does have a Charcot foot which will make healing this somewhat difficult. Electronic Signature(s) Signed: 06/04/2021 4:52:40 PM By: Linton Ham MD Entered By: Linton Ham on 06/04/2021 15:05:21 -------------------------------------------------------------------------------- Physician Orders Details Patient Name: Date of Service: SAGAN, MASELLI IG 06/04/2021 1:15 PM Medical Record Number: [id number] Patient Account Number: [id number] Date of Birth/Sex: Treating RN: [birth year] (60 y.o. [name] Primary Care Provider: Billey Gosling Other Clinician: Referring Provider: Treating Provider/Extender: Genella Rife in Treatment: 0 Verbal / Phone Orders: No Diagnosis Coding Follow-up Appointments ppointment in 2 weeks. - with Dr. Dellia Nims Return A Bathing/ Shower/ Hygiene May shower and wash wound with soap and water. - prior to dressing change Edema Control - Lymphedema / SCD / Other Elevate legs to the level of the heart or above for 30 minutes daily and/or when sitting, a frequency of: - throughout the day Avoid standing for  long periods of time. Exercise regularly Moisturize legs daily. Hyperbaric Oxygen Therapy Wound #3 Left,Plantar Foot Evaluate for HBO Therapy Indication: - Chronic Refractory Osteomyelitis 2.5 ATA for 90 Minutes with 2 Five (5) Minute A Breaks ir Total Number of Treatments: - 40 One treatments per day (delivered Monday through Friday unless otherwise specified in Special Instructions below): Finger stick Blood Glucose Pre- and Post- HBOT Treatment. Follow Hyperbaric Oxygen Glycemia Protocol Afrin (Oxymetazoline HCL) 0.05% nasal spray - 1 spray in both nostrils daily as needed prior to HBO treatment for difficulty clearing ears Wound Treatment Wound #3 - Foot Wound Laterality: Plantar, Left Cleanser: Soap and Water 1 x Per Day Discharge Instructions: May shower and wash wound with dial antibacterial soap and water prior to dressing change. Cleanser: Wound Cleanser 1 x Per Day Discharge Instructions: Cleanse the wound with wound cleanser prior to applying a clean dressing using gauze sponges, not tissue or cotton balls. Prim Dressing: MediHoney Calcium Alginate Dressing 4x5 in 1 x Per Day ary Discharge Instructions: Apply to wound bed as instructed Secondary Dressing: Woven Gauze Sponge, Non-Sterile 4x4 in 1 x Per Day Discharge Instructions: Apply over primary dressing as directed. Secondary Dressing: ABD Pad, 5x9 1 x Per Day Discharge Instructions: Apply over primary dressing as directed. Secured With: [employer], 4.5x3.1 (in/yd) 1 x Per Day Discharge Instructions: Secure with Kerlix as directed. Secured With: 32M Medipore H Soft Cloth Surgical Tape, 2x2 (in/yd) 1 x Per Day Discharge Instructions: Secure dressing with tape as directed. GLYCEMIA INTERVENTIONS PROTOCOL PRE-HBO GLYCEMIA INTERVENTIONS ACTION INTERVENTION Obtain pre-HBO capillary blood glucose (ensure 1 physician order is in chart). A. Notify HBO physician and await physician orders. 2 If result is 70 mg/dl  or below: B. If the result meets the hospital definition of a critical result, follow hospital policy. A. Give patient an 8 ounce Glucerna Shake, an 8 ounce Ensure, or 8 ounces of a Glucerna/Ensure equivalent dietary supplement*. B. Wait 30 minutes. If result is 71 mg/dl to 130 mg/dl: C. Retest patients capillary  blood glucose (CBG). D. If result greater than or equal to 110 mg/dl, proceed with HBO. If result less than 110 mg/dl, notify HBO physician and consider holding HBO. If result is 131 mg/dl to 249 mg/dl: A. Proceed with HBO. A. Notify HBO physician and await physician orders. B. It is recommended to hold HBO and do If result is 250 mg/dl or greater: blood/urine ketone testing. C. If the result meets the hospital definition of a critical result, follow hospital policy. POST-HBO GLYCEMIA INTERVENTIONS ACTION INTERVENTION Obtain post HBO capillary blood glucose (ensure 1 physician order is in chart). A. Notify HBO physician and await physician orders. 2 If result is 70 mg/dl or below: B. If the result meets the hospital definition of a critical result, follow hospital policy. A. Give patient an 8 ounce Glucerna Shake, an 8 ounce Ensure, or 8 ounces of a Glucerna/Ensure equivalent dietary supplement*. B. Wait 15 minutes for symptoms of If result is 71 mg/dl to 100 mg/dl: hypoglycemia (i.e. nervousness, anxiety, sweating, chills, clamminess, irritability, confusion, tachycardia or dizziness). C. If patient asymptomatic, discharge patient. If patient symptomatic, repeat capillary blood glucose (CBG) and notify HBO physician. If result is 101 mg/dl to 249 mg/dl: A. Discharge patient. A. Notify HBO physician and await physician orders. B. It is recommended to do blood/urine ketone If result is 250 mg/dl or greater: testing. C. If the result meets the hospital definition of a critical result, follow hospital policy. *Juice or candies are NOT equivalent  products. If patient refuses the Glucerna or Ensure, please consult the hospital dietitian for an appropriate substitute. Electronic Signature(s) Signed: 06/16/2021 7:45:18 AM By: Linton Ham MD Previous Signature: 06/04/2021 4:52:40 PM Version By: Linton Ham MD Previous Signature: 06/04/2021 6:20:17 PM Version By: [name] RN, BSN Entered By: Linton Ham on 06/16/2021 07:45:18 -------------------------------------------------------------------------------- Problem List Details Patient Name: Date of Service: DONYELL, DING IG 06/04/2021 1:15 PM Medical Record Number: [id number] Patient Account Number: [id number] Date of Birth/Sex: Treating RN: Mar 13, 1961 (60 y.o. [name] Primary Care Provider: Billey Gosling Other Clinician: Referring Provider: Treating Provider/Extender: Genella Rife in Treatment: 0 Active Problems ICD-10 Encounter Code Description Active Date MDM Diagnosis E11.621 Type 2 diabetes mellitus with foot ulcer 06/04/2021 No Yes M14.672 Charcot's joint, left ankle and foot 06/04/2021 No Yes L97.528 Non-pressure chronic ulcer of other part of left foot with other specified 06/04/2021 No Yes severity M86.372 Chronic multifocal osteomyelitis, left ankle and foot 06/04/2021 No Yes Inactive Problems Resolved Problems Electronic Signature(s) Signed: 06/04/2021 4:52:40 PM By: Linton Ham MD Entered By: Linton Ham on 06/04/2021 14:50:00 -------------------------------------------------------------------------------- Progress Note Details Patient Name: Date of Service: [name] 06/04/2021 1:15 PM Medical Record Number: [id number] Patient Account Number: [id number] Date of Birth/Sex: Treating RN: 1960-12-02 (60 y.o. [name] Primary Care Provider: Billey Gosling Other Clinician: Referring Provider: Treating Provider/Extender: Genella Rife in Treatment: 0 Subjective History of Present Illness  (HPI) 10/31/2019 upon evaluation today patient appears to be doing somewhat poorly in regard to his bilateral plantar feet. He has wounds that he tells me have been present since 2012 intermittently off and on. Most recently this has been open for at least the past 6 months to a year. He has been trying to treat this in different ways using Santyl along with various other dressings including Medihoney and even at one point Xeroform. Nothing really has seem to get this completely closed. He was recently in the hospital for cellulitis of his leg subsequently he  did have x-rays as well as MRIs that showed negative for any signs of osteomyelitis in regard to the wounds on his feet. Fortunately there is no signs of systemic infection at this time. No fevers, chills, nausea, vomiting, or diarrhea. Patient has previously used Darco offloading shoes as far as frontal floaters as well as postop surgical shoes. He has never been in a total contact cast that may be something we need to strongly consider here. Patient's most recent hemoglobin A1c 1 month ago was 5.3 seems to be very well controlled which is great. Subsequently he has seen vascular as well as podiatry. His ABIs are 1.07 on the left and 1.14 on the right he seems to be doing well he does have chronic venous stasis. 11/07/2019 upon evaluation today patient appears to be doing well with regard to his wounds all things considered. I do not see any severe worsening he still has some callus buildup on the right more than the left he notes that he has been probably more active than he should as far as walking is concerned is just very hard to not be active. He knows he needs to be more careful in this regard however. He is willing to give the cast a try at this point although he notes that he is a little nervous about this just with regard to balance although he will be very careful and obviously if he has any trouble he knows to contact the office and  let me know. 1/22; patient is in for his obligatory first total contact cast change. Our intake nurse reported a very large amount of drainage which is spelled out over to the surrounding skin. Has bilateral diabetic foot wounds. He has Charcot feet. We have been using silver alginate on his wounds. 11/14/2019 on evaluation today patient is actually seeming to make good improvement in regard to his bilateral plantar foot wounds. We have been using a cast on the left side and on the right side he has been using dressings he is changing up his own accord. With that being said he tells me that he is also not walking as much just due to how unsteady he feels. He takes it easy when he does have to walk and when he does not have to walk he is resting. This is probably help in his right foot as well has the left foot which is actually measuring better. In fact both are measuring better. Overall I am very pleased with how things seem to be progressing. The patient does have some odor on the left foot this does have me concerned about the possibility of infection, and actually probably go ahead and put him on antibiotics today as well as utilizing a continuation of the cast on the left foot I think that will be fine we probably just need to bring him in sooner to change this not last a whole week. 1/29; we brought the patient back today for a total contact cast change on the left out of concern for excessive drainage. We are using drawtex over the wound as the primary dressing 11/21/2019 on evaluation today patient appears to be doing well with regard to his left plantar foot. In fact both foot ulcers actually seem to be doing pretty well. Nonetheless he is having a lot of drainage on the left at this time and again we did obtain a wound culture did show positive for Staph aureus that was reviewed by myself today as well. Nonetheless  he is on Bactrim which was shown to be sensitive that should be helping in this  regard. Fortunately there is no signs of infection systemically at this point. 2/5; back in clinic today for a total contact cast change apparently secondary to very significant drainage. Still using drawtex 11/28/2019 upon evaluation today patient appears to be doing well with regard to his wounds. The right foot is doing okay as measured about the same in my opinion. The left foot is actually showing signs of significant improvement is measuring smaller there is a lot of hyper granulation likely due to the continued drainage at this point. We did obtain approval for a snap VAC I think that is good to be appropriate for him and will likely help this tremendously underneath the cast. He is definitely in agreement with proceeding with such. 2/12; patient came in today after his snap VAC lost suction. Brought in to see one of our nurses. The dressing was replaced and then we put the cast back on and rehooked up the snap VAC. Apparently his wound looked very good per our intake nurse. 2/15; again we replaced the cast on Friday. By Saturday the snap VAC and light suction. He called this morning he comes in acutely. The wounds look fine however the VAC is not functioning. We replaced the cast using silver alginate as the primary dressing backed with Kerramax. The snap VAC was not replaced 12/05/2019 upon evaluation today patient appears to be doing better in regard to his left plantar foot ulcer. Fortunately there is no signs of active infection at this time. Unfortunately he is continuing to have issues with the right foot he is really not making any progress here things seem to be somewhat stagnant to be honest. The depth has increased but that is due to me having debrided the wound in the past based on what I am seeing. 12/12/2019 on evaluation today patient appears to be doing more poorly in regard to the left lower extremity. He has some erythema spreading up the side of his foot I am concerned about  infection again at this point. Unfortunately he has been seeing improvement with a total contact cast but I do not think we should put that on today. On his right plantar foot he continues to have significant drainage this is actually measuring deeper I really do not feel like you are making any progress whatsoever. I have prescribed Granix for him unfortunately his insurance apparently was going to cost him a $500 co-pay. 12/19/2019 upon evaluation today patient actually appears to be doing better in regard to both wounds. With that being said he actually did get the reGranix which he had to pay $500 for. With that being said it does look like that he is actually made some improvement based on what I am seeing at this point with the reGranix. Obviously if he is going to continue this we are going to do something about trying to get him some help in covering the cost. 12/26/2019 on evaluation today patient appears to be doing really much better even compared to last week. Overall the wound seems to be much better even compared to last week and last week was better than the week before. Since has been using the reGranix his symptoms have improved significantly. With that being said the issue right now is simply that this is a very expensive medication for him the first dose cost him $500. Upon inspection patient's wound bed actually is however dramatically  improved compared to before he started this 2 weeks ago. 01/02/2020 upon evaluation today patient appears to actually be doing well. He still had a little bit of reGranix left that has been using in small amounts he just been applying it every other day instead of every day in order to make it go longer. Overall we are still seeing excellent improvement he is measuring smaller looking better healthier tissue and everything seems to be pointing to this headed in the right direction. Fortunately there is no evidence of infection either which is also  excellent news. He does have his MRI coming up within the next week. 01/09/2020 upon evaluation today patient appears to be doing a little worse today compared to previous week's evaluation. He is actually been out of the reGranix at this point. He has been trying to make the stretch out so he has been changing the dressings on a regular set schedule like he was previous. I think this has made a difference. Fortunately there is no signs of active infection at this time. No fevers, chills, nausea, vomiting, or diarrhea. 01/16/2020 upon evaluation today patient appears to be doing well with regard to his left plantar foot ulcer. The right plantar foot still shows some significant depth at this point. Fortunately there is no signs of active infection at this time. 01/30/2020 upon evaluation today patient appears to be doing about the same in regard to his right plantar foot ulcer there is still some depth here and we had to wait till he actually switched over to his new insurance to get approval for the MRI under his new insurance plan. With that being said he now has switched as of April 1. Fortunately there is no signs of active infection at this time. Overall in regard to his left foot ulcer this seems to be doing much better and I am actually very pleased with how things are going. With that being said it is not quite as much progress as we were seeing with the reGranix but at the same time he has had trouble getting this apparently there is been some hindrance here. I Georgina Peer try to actually send this to [facility] that was recommended by the drug rep to me. 02/13/20 upon evaluation today patient appears to be doing better in regard to his left foot ulcer this is great news. Unfortunately the right foot ulcer is not really significantly better at this time. There is no signs of active infection systemically though he did have his MRI which showed unfortunately he does have infection noted including an  abscess in the foot. There is also marrow changes noted which are consistent with osteomyelitis based on the radiology review and interpretation. Unfortunately considering that the wound is not really making the progress that we will he would like to be seen I think that this is an indication that he may need some further referral both infectious disease as well as potentially to podiatrist to see if there is anything that can be done to help with the situation that were dealing with here. The left foot again is doing great. READMISSION 06/04/2021 This is a 59 year old man who was in the clinic in 2021 followed by Jeri Cos for areas on the right and left foot. He developed a left foot infection and was referred to ID. He left the clinic in a nonhealed state and was followed for a period of [facility] Cannon Kettle. Apparently things really deteriorated in early  July when he was admitted to hospital from 04/27/2021 through 05/06/2021 with sepsis secondary to a left foot infection. His blood cultures were negative. An MRI suggested fifth metatarsal osteomyelitis a left ankle septic joint. He was treated with vancomycin and ertapenem which he is still taking and may just about be finishing. He was seen by orthopedics and the patient adamantly refused to BKA. As far as I can tell he did not have the ankle aspirated I am not exactly sure what the issue was here. Since he has been discharged she is at [facility] for rehabilitation. He was last seen by Dr. Drucilla Schmidt on 05/20/2021 he noted osteo of the tibial talar bone cuboid and fifth metatarsal which is even more extensive than what was suggested by the MRI. He is apparently going for a consultation with orthopedic surgery in [place] sometime next week. I received a call about this man 2 weeks ago from Dr. Gwenlyn Found who follows him for the possibility of PAD. ABIs I think done in the office showed a ABI on the right of 1.05 at the PTA and  0.99 at the PTA on the left. He had a DVT rule out in the left leg that was negative for the DVT. Past medical history is extensive and includes diastolic heart failure, right first metatarsal head ulcer in 2021, excision of the right second ray by Dr. Cannon Kettle on 03/13/2020, hypertension, hypothyroidism. Left total hip replacement, right total knee replacement, carpal tunnel syndrome, obstructive sleep apnea alcohol abuse with cirrhosis although the patient denies current alcohol intake. The patient does not think he is a diabetic however looking through [place] link I see 2 ABIs of this year that were greater than equal to 6.5 which by definition makes him diabetic. Nevertheless he is not on any treatment and does not check his blood sugars. Patient History Information obtained from Patient. Allergies Claritin (Severity: Moderate, Reaction: anxiety) Family History Unknown History. Social History Former smoker, Marital Status - Divorced, Alcohol Use - Rarely - history of alcoholism, Drug Use - Current History - Marijuana, Caffeine Use - Moderate. Medical History Hematologic/Lymphatic Patient has history of Anemia Respiratory Patient has history of Sleep Apnea Denies history of Asthma, Chronic Obstructive Pulmonary Disease (COPD) Cardiovascular Patient has history of Congestive Heart Failure, Hypertension, Peripheral Venous Disease Gastrointestinal Patient has history of Cirrhosis Denies history of Crohnoos Endocrine Patient has history of Type II Diabetes Musculoskeletal Patient has history of Osteoarthritis Neurologic Patient has history of Neuropathy Medical A Surgical History Notes nd Gastrointestinal Hx ETOH abuse Endocrine Hypothyroidism Review of Systems (ROS) Constitutional Symptoms (General Health) Denies complaints or symptoms of Fatigue, Fever, Chills, Marked Weight Change. Eyes Denies complaints or symptoms of Dry Eyes, Vision Changes, Glasses /  Contacts. Ear/Nose/Mouth/Throat Denies complaints or symptoms of Chronic sinus problems or rhinitis. Integumentary (Skin) Complains or has symptoms of Wounds - wound on left foot. Musculoskeletal Denies complaints or symptoms of Muscle Pain, Muscle Weakness. Psychiatric Denies complaints or symptoms of Claustrophobia, Suicidal. Objective Constitutional Patient is hypertensive.. Pulse regular and within target range for patient.Marland Kitchen Respirations regular, non-labored and within target range.. Temperature is normal and within the target range for the patient.Marland Kitchen Appears in no distress. Vitals Time Taken: 1:28 PM, Height: 73 in, Source: Stated, Weight: 270 lbs, Source: Stated, BMI: 35.6, Temperature: 97.7 F, Pulse: 90 bpm, Respiratory Rate: 16 breaths/min, Blood Pressure: 153/93 mmHg. Respiratory work of breathing is normal. Bilateral breath sounds are clear and equal in all lobes with no wheezes, rales or  rhonchi.. Cardiovascular Heart rhythm and rate regular, without murmur or gallop.. Pulses are easily palpable on the left at both the dorsalis pedis and posterior tibial. He has some degree of chronic venous insufficiency in the left leg. Musculoskeletal Surprisingly no obvious effusion in the left ankle. Neurological Markedly reduced sensation on the left foot. General Notes: Wound exam; the area in question is in the left mid plantar foot slightly to the lateral aspect. This is a clean surface wound looks healthy. There is no depth certainly no exposed bone no surrounding infection. He does have a Charcot foot which will make healing this somewhat difficult. Integumentary (Hair, Skin) Wound #3 status is Open. Original cause of wound was Gradually Appeared. The date acquired was: 03/18/2021. The wound is located on the [place]. The wound measures 2.5cm length x 3.6cm width x 0.1cm depth; 7.069cm^2 area and 0.707cm^3 volume. There is Fat Layer (Subcutaneous Tissue) exposed. There is  no tunneling or undermining noted. There is a medium amount of serosanguineous drainage noted. The wound margin is flat and intact. There is large (67-100%) red granulation within the wound bed. There is no necrotic tissue within the wound bed. Assessment Active Problems ICD-10 Type 2 diabetes mellitus with foot ulcer Charcot's joint, left ankle and foot Non-pressure chronic ulcer of other part of left foot with other specified severity Chronic multifocal osteomyelitis, left ankle and foot HBO Evaluation Reason for HBO Patient has chronic refractory osteomyelitis in his left foot documented by MRI and supported by ongoing review by infectious disease. He is already refused an amputation of the left foot suggested by orthopedic. He has completed 6 weeks of IV antibiotics as of 8/21 and is now on a combination of oral antibiotics. His wound has not closed. He was initially admitted to hospital from 04/27/2021 through 05/06/2021 with osteomyelitis, left ankle septic joint and severe sepsis Site of CRO As documented by MRI he has bone marrow edema within the fifth metatarsal base. Infectious disease felt his osteomyelitis was more extensive involving the tibia talar bone and cuboid in addition to the fifth metatarsal Imaging studies to support CRO The patient had 2 MRIs of the left foot. The first was ordered by orthopedics. We do not have this report but we do have the results quoted by his orthopedic surgeon that did not show evidence of osteomyelitis in the wound area on his left foot. MRI on 04/29/2021 during his index hospitalization showed a large plantar foot ulceration with bone marrow edema within the fifth metatarsal base also noted to have a tibiotalar joint effusion Antibiotic outcome Patient is completing 6 weeks of vancomycin and ertapenem finishing on 06/07/2021. He will then transition to Augmentin and doxycycline as prescribed by Dr. Tommy Medal of infectious disease. Sepsis and overt  left foot infection have improved. However his wound has still not closed. Surgical resection outcome N/A MRI results The patient has osteomyelitis at the base of the fifth metatarsal. Probably also involvement of the tibiotalar area. All of this was new from her previous MRI that was done in June and interpreted by Dr. Doran Durand orthopedics] as having no evidence of osteomyelitis Labs reviewed Patient had elevated inflammatory markers on 04/27/2021 with a sedimentation rate of 85 and a C-reactive protein of 33.5. These have been repeated on 06/25/2021. Although the C-reactive protein is gone down at 1.4 it is still elevated. Sedimentation rate only down slightly at 70 Plan of care/Summary This is a patient with chronic refractory osteomyelitis as described. He has a  nonhealing wound on the lateral aspect of his left foot. He was referred to [place] by Dr. Gwenlyn Found of interventional cardiology for consideration of hyperbaric oxygen therapy. Indeed he is a candidate for hyperbaric oxygen therapy 2.5 atm with two 5-minute air breaks. 5 treatments per week for 40 treatments. The goal of therapy will be to eradicate the underlying osteomyelitis and allow for the best chance of healing the wound on his left foot. He has completed his IV antibiotics and will continue to follow with Dr. Tommy Medal. The wound on his left plantar foot has been refractory. His inflammatory markers are still elevated suggesting continued activity of the underlying bone infection. He is already refused an amputation offered by orthopedics. All issues with regards to the benefits and risks of hyperbarics were described to the patient and he wished to proceed Plan Follow-up Appointments: Return Appointment in 2 weeks. - with Dr. Dellia Nims Bathing/ Shower/ Hygiene: May shower and wash wound with soap and water. - prior to dressing change Edema Control - Lymphedema / SCD / Other: Elevate legs to the level of the heart or above for 30 minutes  daily and/or when sitting, a frequency of: - throughout the day Avoid standing for long periods of time. Exercise regularly Moisturize legs daily. Hyperbaric Oxygen Therapy: Wound #3 Left,Plantar Foot: Evaluate for HBO Therapy Indication: - Chronic Refractory Osteomyelitis 2.5 ATA for 90 Minutes with 2 Five (5) Minute Air Breaks T Number of Treatments: - 40 otal One treatments per day (delivered Monday through Friday unless otherwise specified in Special Instructions below): Finger stick Blood Glucose Pre- and Post- HBOT Treatment. Follow Hyperbaric Oxygen Glycemia Protocol Afrin (Oxymetazoline HCL) 0.05% nasal spray - 1 spray in both nostrils daily as needed prior to HBO treatment for difficulty clearing ears WOUND #3: - Foot Wound Laterality: Plantar, Left Cleanser: Soap and Water 1 x Per Day/ Discharge Instructions: May shower and wash wound with dial antibacterial soap and water prior to dressing change. Cleanser: Wound Cleanser 1 x Per Day/ Discharge Instructions: Cleanse the wound with wound cleanser prior to applying a clean dressing using gauze sponges, not tissue or cotton balls. Prim Dressing: MediHoney Calcium Alginate Dressing 4x5 in 1 x Per Day/ ary Discharge Instructions: Apply to wound bed as instructed Secondary Dressing: Woven Gauze Sponge, Non-Sterile 4x4 in 1 x Per Day/ Discharge Instructions: Apply over primary dressing as directed. Secondary Dressing: ABD Pad, 5x9 1 x Per Day/ Discharge Instructions: Apply over primary dressing as directed. Secured With: [employer], 4.5x3.1 (in/yd) 1 x Per Day/ Discharge Instructions: Secure with Kerlix as directed. Secured With: 69M Medipore H Soft Cloth Surgical T ape, 2x2 (in/yd) 1 x Per Day/ Discharge Instructions: Secure dressing with tape as directed. 1. I have continued with the Medihoney that he is currently getting at the nursing home as the primary dressing kerlix wrap. 2. I talked to him about the importance of  offloading this. He apparently has a scooter and crutches and is going to try to do this at home. He says he will be discharged from the nursing home next week [[facility]] 3. He had an aggressive infection in the bone in his left foot I think he would benefit from hyperbaric oxygen 2.5 atm with two 5-minute air breaks. Monday through Friday x40 treatments. Unfortunately patient does not have a supplement to a straight Medicare we will try to get him a figure for what he might owe out-of-pocket but he still seems motivated to move forward as  does his friend 4. He is completing his antibiotics IV and will apparently moved to oral antibiotics as directed by Dr. Drucilla Schmidt sees him tomorrow. 5. He refused an amputation in hospital truthfully looking at the foot now I do not see a reason for amputation at present. I told him that it is possible that the osteomyelitis may return and if that is the case then an amputation may be necessary. 6. Of his other medical problems he has cirrhosis of liver from alcohol. He is being discharged from the nursing home I counseled him against any alcohol intake. His partner also says she will stop drinking as well. He seems well motivated and I do not think this should preclude him from aggressive management of his wound and underlying osteomyelitis. I spent 38 minutes on review of this patient's past medical history, face-to-face evaluation and preparation of this record Electronic Signature(s) Signed: 06/25/2021 4:51:10 PM By: Linton Ham MD Previous Signature: 06/25/2021 4:50:00 PM Version By: Linton Ham MD Previous Signature: 06/16/2021 7:45:51 AM Version By: Linton Ham MD Previous Signature: 06/10/2021 12:52:53 PM Version By: Linton Ham MD Previous Signature: 06/04/2021 4:52:40 PM Version By: Linton Ham MD Entered By: Linton Ham on 06/25/2021  16:51:10 -------------------------------------------------------------------------------- [facility]xROS Details Patient Name: Date of Service: [name], [name] IG 06/04/2021 1:15 PM Medical Record Number: [id number] Patient Account Number: [id number] Date of Birth/Sex: Treating RN: [birth year] (60 y.o. [name] Primary Care Provider: Billey Gosling Other Clinician: Referring Provider: Treating Provider/Extender: Genella Rife in Treatment: 0 Information Obtained From Patient Constitutional Symptoms (General Health) Complaints and Symptoms: Negative for: Fatigue; Fever; Chills; Marked Weight Change Eyes Complaints and Symptoms: Negative for: Dry Eyes; Vision Changes; Glasses / Contacts Ear/Nose/Mouth/Throat Complaints and Symptoms: Negative for: Chronic sinus problems or rhinitis Integumentary (Skin) Complaints and Symptoms: Positive for: Wounds - wound on left foot Musculoskeletal Complaints and Symptoms: Negative for: Muscle Pain; Muscle Weakness Medical History: Positive for: Osteoarthritis Psychiatric Complaints and Symptoms: Negative for: Claustrophobia; Suicidal Hematologic/Lymphatic Medical History: Positive for: Anemia Respiratory Medical History: Positive for: Sleep Apnea Negative for: Asthma; Chronic Obstructive Pulmonary Disease (COPD) Cardiovascular Medical History: Positive for: Congestive Heart Failure; Hypertension; Peripheral Venous Disease Gastrointestinal Medical History: Positive for: Cirrhosis Negative for: Crohns Past Medical History Notes: Hx ETOH abuse Endocrine Medical History: Positive for: Type II Diabetes Past Medical History Notes: Hypothyroidism Time with diabetes: 2017 Treated with: Diet Blood sugar tested every day: No Immunological Neurologic Medical History: Positive for: Neuropathy Oncologic Immunizations Pneumococcal Vaccine: Received Pneumococcal Vaccination: No Implantable Devices None Family and  Social History Unknown History: Yes; Former smoker; Marital Status - Divorced; Alcohol Use: Rarely - history of alcoholism; Drug Use: Current History - Marijuana; Caffeine Use: Moderate; Financial Concerns: No; Food, Clothing or Shelter Needs: No; Support System Lacking: No; Transportation Concerns: No Engineer, maintenance) Signed: 06/04/2021 4:52:40 PM By: Linton Ham MD Signed: 06/04/2021 6:20:17 PM By: [name] RN, BSN Entered By: [name] on 06/04/2021 14:10:25 -------------------------------------------------------------------------------- [place] Details Patient Name: Date of Service: [name] 06/04/2021 Medical Record Number: [id number] Patient Account Number: [id number] Date of Birth/Sex: Treating RN: 02-Dec-1960 (60 y.o. [name] Primary Care Provider: Billey Gosling Other Clinician: Referring Provider: Treating Provider/Extender: Genella Rife in Treatment: 0 Diagnosis Coding ICD-10 Codes Code Description [phone] Type 2 diabetes mellitus with foot ulcer M14.672 Charcot's joint, left ankle and foot L97.528 Non-pressure chronic ulcer of other part of left foot with other specified severity M86.372 Chronic multifocal osteomyelitis, left ankle and foot Facility Procedures  CPT4 Code: [id number] Description: 84069 - WOUND CARE VISIT-LEV 4 EST PT Modifier: Quantity: 1 Physician Procedures : CPT4 Code Description Modifier [id number] - WC PHYS LEVEL 4 - EST PT ICD-10 Diagnosis Description E11.621 Type 2 diabetes mellitus with foot ulcer M14.672 Charcot's joint, left ankle and foot L97.528 Non-pressure chronic ulcer of other part of left  foot with other specified severity M86.372 Chronic multifocal osteomyelitis, left ankle and foot Quantity: 1 Electronic Signature(s) Signed: 06/04/2021 4:52:40 PM By: Linton Ham MD Signed: 06/04/2021 6:20:17 PM By: [name] RN, BSN Entered By: [name] on 06/04/2021 15:49:51

## 2021-06-05 ENCOUNTER — Ambulatory Visit (INDEPENDENT_AMBULATORY_CARE_PROVIDER_SITE_OTHER): Payer: Medicare Other | Admitting: Infectious Disease

## 2021-06-05 ENCOUNTER — Encounter: Payer: Self-pay | Admitting: Infectious Disease

## 2021-06-05 ENCOUNTER — Other Ambulatory Visit: Payer: Self-pay

## 2021-06-05 ENCOUNTER — Telehealth: Payer: Self-pay

## 2021-06-05 VITALS — BP 146/100 | HR 87 | Temp 97.9°F

## 2021-06-05 DIAGNOSIS — R6520 Severe sepsis without septic shock: Secondary | ICD-10-CM

## 2021-06-05 DIAGNOSIS — M869 Osteomyelitis, unspecified: Secondary | ICD-10-CM | POA: Diagnosis not present

## 2021-06-05 DIAGNOSIS — S98131A Complete traumatic amputation of one right lesser toe, initial encounter: Secondary | ICD-10-CM | POA: Diagnosis not present

## 2021-06-05 DIAGNOSIS — G621 Alcoholic polyneuropathy: Secondary | ICD-10-CM

## 2021-06-05 DIAGNOSIS — A419 Sepsis, unspecified organism: Secondary | ICD-10-CM | POA: Diagnosis not present

## 2021-06-05 DIAGNOSIS — I5032 Chronic diastolic (congestive) heart failure: Secondary | ICD-10-CM

## 2021-06-05 MED ORDER — AMOXICILLIN-POT CLAVULANATE 875-125 MG PO TABS: 1.0000 | ORAL_TABLET | Freq: Two times a day (BID) | ORAL | 11 refills | Status: DC

## 2021-06-05 MED ORDER — DOXYCYCLINE HYCLATE 100 MG PO TABS: 100.0000 mg | ORAL_TABLET | Freq: Two times a day (BID) | ORAL | 11 refills | Status: DC

## 2021-06-05 NOTE — Telephone Encounter (Signed)
Spoke to [place] at [facility], relayed that okay to pull PICC after last dose per Dr. Daiva Eves and that patient should be started on PO doxycycline and Augmentin once IV antibiotics are completed. Written orders were also sent with the patient.   Sandie Ano, RN

## 2021-06-05 NOTE — Progress Notes (Signed)
Subjective:  Chief complaint: foot pain   Patient ID: [name], male    DOB: Jan 29, 1961, 60 y.o.   MRN: [id number]  HPI 60 year old Caucasian man with a history of multiple medical problems including alcoholism peripheral vascular disease neuropathy, possibly due to B12 deficiency, lymphedema diabetes but never with a terribly high A1c now found to have osteomyelitis involving his tibia talar bone cuboid and fifth metatarsal.  Saw him in the hospital and at that time he was completely against a below the knee amputation which we feel strongly he would need for cure.  Absent cultures to guide therapy we placed him on vancomycin and ertapenem and he has been discharged to skilled nursing facility with plans for antibiotics to continue for a few more weeks into August.  I believe August 22nd his stop date based on Minh Pham's note.   He tells me he is receiving wound care at the nursing facility and they are performing debridements on the wound and that has been improving.   He is scheduled to see his orthopedic surgeon with [facility] [place] on June 11, 2021.  He returns to clinic today with his his girlfriend and they brought a picture of his wound taken yesterday.  He feels it is improved he still is not at all ready for below the knee amputation and wants to go with the option of chronic suppressive therapy.   Past Medical History:  Diagnosis Date   Alcohol dependence ([place])    Arthritis    hips, hands   Bilateral carpal tunnel syndrome    Bilateral leg edema    Chronic   CHF (congestive heart failure) (HCC)    Diabetes mellitus without complication (HCC)    type 2   Diverticulitis    portion of colon removed   DOE (dyspnea on exertion)    occ   Elevated liver enzymes    Fatty liver    GERD (gastroesophageal reflux disease)    occ   Hammer toe    Hip pain    History of ventral hernia repair 2016   x2   Hyperlipidemia    pt unsure   Hypertension     Marijuana abuse    Morbid obesity ([place])    Neuromuscular disorder ([place])    peripheral neuropathy feet and few fingers   OSA (obstructive sleep apnea)    has OSA-not used CPAP 2-3 yrs could not tolerate cpap   PONV (postoperative nausea and vomiting)    Toe ulcer ([place])    left healed    Past Surgical History:  Procedure Laterality Date   AMPUTATION TOE Left 05/25/2018   Procedure: AMPUTATION TOE left 3rd;  Surgeon: Wylene Simmer, MD;  Location: [facility];  Service: Orthopedics;  Laterality: Left;  35mn, to follow   AMPUTATION TOE Left 06/28/2018   Procedure: Left foot revision 3rd toe amputation including 3rd metatarsal;  Surgeon: HWylene Simmer MD;  [place]  Service: Orthopedics;  Laterality: Left;  677m   BICEPS TENDON REPAIR Left 2014   Partial   COLONOSCOPY     GRAFT APPLICATION Right 02/18/99/7622 Procedure: GRAFT APPLICATION;  Surgeon: StLandis MartinsDPM;  [facility] Service: Podiatry;  Laterality: Right;   HERNIA REPAIR  2016   ventral   HIP CLOSED REDUCTION Right 09/01/2018   Procedure: CLOSED REDUCTION HIP;  Surgeon: RoNicholes StairsMD;  [facility];  Service: Orthopedics;  Laterality: Right;  INCISION AND DRAINAGE OF WOUND Right 03/03/2020   Procedure: IRRIGATION AND DEBRIDEMENT WOUND;  Surgeon: Landis Martins, DPM;  Location: [facility];  Service: Podiatry;  Laterality: Right;   JOINT REPLACEMENT     b/l knees    METATARSAL HEAD EXCISION Right 03/03/2020   Procedure: IRRIGATION OF TOE AND CAUTERIZATION OF BLEEDING TOE;  Surgeon: Landis Martins, DPM;  Location: [facility];  Service: Podiatry;  Laterality: Right;   METATARSAL HEAD EXCISION Right 03/03/2020   Procedure: METATARSAL HEAD EXCISION SECOND TOE RIGHT;  Surgeon: Landis Martins, DPM;  Location: [facility];  Service: Podiatry;  Laterality: Right;  MAC WITH LOCAL   TOE AMPUTATION Right 09/2013   TOTAL HIP  ARTHROPLASTY Right 08/16/2018   Procedure: TOTAL HIP ARTHROPLASTY ANTERIOR APPROACH;  Surgeon: Rod Can, MD;  [facility];  Service: Orthopedics;  Laterality: Right;   TOTAL HIP ARTHROPLASTY Left 11/02/2018   Procedure: TOTAL HIP ARTHROPLASTY ANTERIOR APPROACH;  Surgeon: Rod Can, MD;  [facility];  Service: Orthopedics;  Laterality: Left;   TOTAL KNEE ARTHROPLASTY     bilat    Family History  Adopted: Yes  Family history unknown: Yes      Social History   Socioeconomic History   Marital status: Divorced    Spouse name: Not on file   Number of children: Not on file   Years of education: Not on file   Highest education level: Not on file  Occupational History   Occupation: unemployed, filing for disability  Tobacco Use   Smoking status: Former    Packs/day: 0.25    Years: 10.00    Pack years: 2.50    Types: Cigarettes   Smokeless tobacco: Never   Tobacco comments:    quit 2018  Vaping Use   Vaping Use: Never used  Substance and Sexual Activity   Alcohol use: Not Currently    Comment: 2-3 drinks 4-5 nights a week, liquor   Drug use: Yes    Frequency: 1.0 times per week    Types: Marijuana    Comment: "4 times a month, maybe"   Sexual activity: Not on file  Other Topics Concern   Not on file  Social History Narrative   Not on file   Social Determinants of Health   Financial Resource Strain: Not on file  Food Insecurity: Not on file  Transportation Needs: Not on file  Physical Activity: Not on file  Stress: Not on file  Social Connections: Not on file    Allergies  Allergen Reactions   Claritin [Loratadine] Shortness Of Breath and Anxiety     Current Outpatient Medications:    acamprosate (CAMPRAL) 333 MG tablet, Take 333 mg by mouth 2 (two) times daily., Disp: , Rfl:    amoxicillin-clavulanate (AUGMENTIN) 875-125 MG tablet, Take 1 tablet by mouth 2 (two) times daily., Disp: 60 tablet, Rfl: 11   docusate sodium (COLACE) 100 MG  capsule, TAKE 1 CAPSULE (100 MG TOTAL) BY MOUTH DAILY AS NEEDED., Disp: 30 capsule, Rfl: 2   doxycycline (VIBRA-TABS) 100 MG tablet, Take 1 tablet (100 mg total) by mouth 2 (two) times daily., Disp: 60 tablet, Rfl: 11   DULoxetine (CYMBALTA) 20 MG capsule, Take 1 capsule (20 mg total) by mouth daily., Disp: , Rfl:    ertapenem 1,000 mg in sodium chloride 0.9 % 100 mL, Inject 1,000 mg into the vein daily., Disp: , Rfl:    furosemide (LASIX) 40 MG tablet, TAKE 1 TABLET BY MOUTH TWICE A  DAY, Disp: 180 tablet, Rfl: 1   gabapentin (NEURONTIN) 100 MG capsule, Take 1 capsule (100 mg total) by mouth 3 (three) times daily., Disp: 90 capsule, Rfl: 3   HYDROcodone-acetaminophen (NORCO/VICODIN) 5-325 MG tablet, Take 1 tablet by mouth every 8 (eight) hours as needed for moderate pain., Disp: , Rfl:    levothyroxine (SYNTHROID) 50 MCG tablet, Take 50 mcg by mouth daily before breakfast., Disp: , Rfl:    Multiple Vitamin (MULTIVITAMIN WITH MINERALS) TABS tablet, Take 1 tablet by mouth daily., Disp: 30 tablet, Rfl: 0   naltrexone (DEPADE) 50 MG tablet, Take 50 mg by mouth daily., Disp: , Rfl:    naproxen sodium (ALEVE) 220 MG tablet, Take 220 mg by mouth every 12 (twelve) hours., Disp: , Rfl:    ondansetron (ZOFRAN) 4 MG tablet, Take 4 mg by mouth every 8 (eight) hours as needed for nausea or vomiting., Disp: , Rfl:    potassium chloride SA (KLOR-CON M20) 20 MEQ tablet, Take 3 tablets (60 mEq total) by mouth 2 (two) times daily., Disp: 180 tablet, Rfl: 0   vancomycin (VANCOREADY) 1250 MG/250ML SOLN, Inject 250 mLs (1,250 mg total) into the vein every 12 (twelve) hours., Disp: 15000 mL, Rfl: 1   vitamin B-12 (CYANOCOBALAMIN) 100 MCG tablet, Take 100 mcg by mouth daily., Disp: , Rfl:    HEMP OIL-VANILLYL BUTYL ETHER EX, Apply 1 application topically 2 (two) times daily as needed (arthritis pain)., Disp: , Rfl:    hydrOXYzine (ATARAX/VISTARIL) 25 MG tablet, Take 25 mg by mouth every 6 (six) hours as needed for  anxiety., Disp: , Rfl:    traZODone (DESYREL) 50 MG tablet, TAKE 1-2 TABLETS (50-100 MG TOTAL) BY MOUTH AT BEDTIME AS NEEDED FOR SLEEP., Disp: 180 tablet, Rfl: 1   Review of Systems  Constitutional:  Negative for activity change, appetite change, chills, diaphoresis, fatigue, fever and unexpected weight change.  HENT:  Negative for congestion, rhinorrhea, sinus pressure, sneezing, sore throat and trouble swallowing.   Eyes:  Negative for photophobia and visual disturbance.  Respiratory:  Negative for cough, chest tightness, shortness of breath, wheezing and stridor.   Cardiovascular:  Negative for chest pain, palpitations and leg swelling.  Gastrointestinal:  Negative for abdominal distention, abdominal pain, anal bleeding, blood in stool, constipation, diarrhea, nausea and vomiting.  Genitourinary:  Negative for difficulty urinating, dysuria, flank pain and hematuria.  Musculoskeletal:  Negative for arthralgias, back pain, gait problem, joint swelling and myalgias.  Skin:  Positive for wound. Negative for color change, pallor and rash.  Neurological:  Negative for dizziness, tremors, weakness, light-headedness and headaches.  Hematological:  Negative for adenopathy. Does not bruise/bleed easily.  Psychiatric/Behavioral:  Negative for agitation, behavioral problems, confusion, decreased concentration, dysphoric mood, sleep disturbance and suicidal ideas.       Objective:   Physical Exam Constitutional:      Appearance: He is well-developed.  HENT:     Head: Normocephalic and atraumatic.  Eyes:     Conjunctiva/sclera: Conjunctivae normal.  Cardiovascular:     Rate and Rhythm: Normal rate and regular rhythm.  Pulmonary:     Effort: Pulmonary effort is normal. No respiratory distress.     Breath sounds: No wheezing.  Abdominal:     General: There is no distension.     Palpations: Abdomen is soft.  Musculoskeletal:        General: Normal range of motion.     Cervical back: Normal  range of motion and neck supple.  Skin:  General: Skin is warm and dry.     Coloration: Skin is not pale.     Findings: No erythema or rash.  Neurological:     General: No focal deficit present.     Mental Status: He is alert and oriented to person, place, and time.  Psychiatric:        Mood and Affect: Mood normal.        Behavior: Behavior normal.        Thought Content: Thought content normal.        Judgment: Judgment normal.    Wound as pictured in photo taken by his girlfriend 06/05/2021:     PICC line 06/05/2021:       Assessment & Plan:   Osteomyelitis involving the ankle including the talar bones cuboid cuboid bones and fifth metatarsal  He will continue his IV ertapenem through this Sunday.  I have given orders for PICC line that then be discontinued.  I am prescribing Augmentin and doxycycline which she will take.  He will go see his surgeon for follow-up.  He is also going to be seeing Dr. Dellia Nims with wound care and going to try hyperbaric oxygen therapy.  Neuropathy which was the cause of this will put him at risk for further infections and still need to monitor closely for this.  Diabetes: A1c from May 2022 reviewed and was 5,7  CHF: need to make sure this is optmiized as well

## 2021-06-08 ENCOUNTER — Other Ambulatory Visit: Payer: Self-pay

## 2021-06-08 ENCOUNTER — Ambulatory Visit (HOSPITAL_COMMUNITY)
Admission: RE | Admit: 2021-06-08 | Discharge: 2021-06-08 | Disposition: A | Discharge status: Home / Self Care | Payer: Medicare Other | Source: Ambulatory Visit | Attending: Cardiology | Admitting: Cardiology

## 2021-06-08 DIAGNOSIS — I70222 Atherosclerosis of native arteries of extremities with rest pain, left leg: Secondary | ICD-10-CM | POA: Diagnosis not present

## 2021-06-08 DIAGNOSIS — I70229 Atherosclerosis of native arteries of extremities with rest pain, unspecified extremity: Secondary | ICD-10-CM | POA: Diagnosis not present

## 2021-06-10 ENCOUNTER — Other Ambulatory Visit: Payer: Self-pay | Admitting: *Deleted

## 2021-06-10 ENCOUNTER — Telehealth: Payer: Self-pay

## 2021-06-10 ENCOUNTER — Encounter (HOSPITAL_BASED_OUTPATIENT_CLINIC_OR_DEPARTMENT_OTHER): Payer: Medicare Other | Admitting: Internal Medicine

## 2021-06-10 NOTE — Telephone Encounter (Signed)
Bianca with [facility] called to clarify if patient is suppose to stop vanc and ertapenem on 8/21. Per Marena Chancy they only stopped the ertapenem on 8/21. She is also asking how long will patient be doing the oral antibiotics.  Please advise.

## 2021-06-10 NOTE — Patient Outreach (Signed)
[facility] Post- Acute Care Coordinator follow up. Member screened for potential [facility] care coordination needs.  [name] resides in [facility] SN[facility]. Telephone call made to member at [phone]. No answer. Left HIPAA compliant voicemail message to request return call.   Will follow up with [facility] SW regarding transition date/plans.   [name] PCP office has [facility] embedded chronic care coordination team.   Will plan outreach again to discuss potential [facility] needs.    Raiford Noble, MSN, RN,BSN [facility] Post Acute Care Coordinator [phone] [facility]) [phone]  (Toll free office)

## 2021-06-10 NOTE — Telephone Encounter (Signed)
Spoke with [name] with [facility], I advised her that the vancomycin can be stopped and picc can be pulled. I also advised her to start oral antibiotic after stopping, which patient will continue the oral antibiotics indefinitely per Dr. Daiva Eves. [name] T [name]

## 2021-06-12 ENCOUNTER — Other Ambulatory Visit: Payer: Self-pay | Admitting: *Deleted

## 2021-06-12 NOTE — Patient Outreach (Signed)
[facility] Post-Acute Care Coordinator follow up.  Mr. Barriere resides in [facility] SN[facility].   Update received from [facility] SN[facility] SW reporting Mr. Fruth will not transition home this week. Awaiting to hear if weight bearing status has changed. Ortho appointment was this week.   Writer will keep trying to reach member to discuss [facility] follow up. Mr. Degeorge goes to PCP office that embedded [facility] care coordination team.    Raiford Noble, MSN, RN,BSN [facility] Post Acute Care Coordinator [phone] [facility]) [phone]  (Toll free office)

## 2021-06-16 ENCOUNTER — Other Ambulatory Visit: Payer: Self-pay | Admitting: *Deleted

## 2021-06-16 DIAGNOSIS — I5032 Chronic diastolic (congestive) heart failure: Secondary | ICD-10-CM

## 2021-06-16 NOTE — Patient Outreach (Signed)
[facility] Post- Acute Care Coordinator follow up. Mr. Andonian resides in [facility] SN[facility]. He is slated to transition home on 06/17/21 with [facility].   Telephone call made to Mr. Mario [phone]. Patient identifiers confirmed. Mr. Abernethy confirms he is returning home tomorrow with his significant other. States he already as all the equipment needs at home. States his girlfriend travels for work a lot. However, he states she will be home for a little while to be sure he gets settled in.  Confirmed PCP is Dr. Lawerance Bach. Discussed that Dr. Lawerance Bach office has [facility] embedded care coordination team. Mr. Easler is agreeable to writer making a referral. Encouraged Mr. Shillingford to make a referral with Dr. Lawerance Bach as well for post SNF follow up. Mr. Swab states he will call to make appointment.   Unfortunately, telephone call was brief as Mr. Emmick cell phone reception was in and out.   Will make referral to [facility] embedded care coordination team. Mr. Brannock has multiple co-morbities to include DM, CHF, cellulitis, osteomyelitis, HTN, alcohol abuse. Per chart records BKA has been recommended but [facility] has declined surgery thus far.     Raiford Noble, MSN, RN,BSN [facility] Post Acute Care Coordinator [phone] [facility]) [phone]  (Toll free office)

## 2021-06-16 NOTE — Patient Outreach (Signed)
[name] resides in [facility] SN[facility]. Member screened for potential [facility] Care Management needs.   Second attempt made to reach [name] [phone] to discuss [facility] services. No answer. HIPAA compliant voicemail message left to request return call.   Also attempted to reach member's significant other/DPR  [name] [phone]. No answer. HIPAA compliant voicemail message left to request return call.   Raiford Noble, MSN, RN,BSN [facility] Post Acute Care Coordinator [phone] [facility]) [phone]  (Toll free office)

## 2021-06-16 NOTE — Patient Outreach (Signed)
[facility] Post- Acute Care Coordinator follow up.  Telephone call made to [facility]. Spoke with [place], SNF SW,  to confirm Mr. Hopfensperger contact numbers. Wilkie Aye also endorses member will transition to home tomorrow 06/17/21 with [facility] health  PT/OT/RN/aide services.   Discussed that writer has been attempting to reach Mr. Waterson and significant other/DPR.  Will plan outreach again later today.    Raiford Noble, MSN, RN,BSN [facility] Post Acute Care Coordinator [phone] [facility]) [phone]  (Toll free office)

## 2021-06-17 ENCOUNTER — Telehealth: Payer: Self-pay | Admitting: *Deleted

## 2021-06-17 NOTE — Chronic Care Management (AMB) (Signed)
  Chronic Care Management   Note  06/17/2021 Name: Izaah Westman MRN: [id number] DOB: 08-02-1961  Kemauri Musa is a 60 y.o. year old male who is a primary care patient of Burns, Claudina Lick, MD. I reached out to [name] by phone today in response to a referral sent by Mr. Vincen Bejar PCP, Dr. Quay Burow.      Mr. Mayeda was given information about Chronic Care Management services today including:  CCM service includes personalized support from designated clinical staff supervised by his physician, including individualized plan of care and coordination with other care providers 24/7 contact phone numbers for assistance for urgent and routine care needs. Service will only be billed when office clinical staff spend 20 minutes or more in a month to coordinate care. Only one practitioner may furnish and bill the service in a calendar month. The patient may stop CCM services at any time (effective at the end of the month) by phone call to the office staff. The patient will be responsible for cost sharing (co-pay) of up to 20% of the service fee (after annual deductible is met).  Patient agreed to services and verbal consent obtained.   Follow up plan: Telephone appointment with care management team member scheduled for:06/24/21  [facility]  Direct Dial: [phone]

## 2021-06-18 ENCOUNTER — Encounter (HOSPITAL_BASED_OUTPATIENT_CLINIC_OR_DEPARTMENT_OTHER): Payer: Medicare Other | Admitting: Internal Medicine

## 2021-06-20 ENCOUNTER — Other Ambulatory Visit: Payer: Self-pay | Admitting: Family Medicine

## 2021-06-20 ENCOUNTER — Telehealth: Payer: Self-pay | Admitting: Family Medicine

## 2021-06-20 MED ORDER — METOPROLOL TARTRATE 50 MG PO TABS
50.0000 mg | ORAL_TABLET | Freq: Two times a day (BID) | ORAL | 0 refills | Status: DC
Start: 2021-06-20 — End: 2021-09-05

## 2021-06-20 NOTE — Telephone Encounter (Signed)
On call note: pt just recently got home from [facility], getting IV abx for foot ulcer/cellulitis. HH nurse called b/c pt bp 140-150/110-120, HR 100-105. Not on bp med but does have hx of HTN. Will get lopressor 50mg  bid started.  Also, pt with instructions to take both hydrocodone and naltrexone, needs clarification. I instructed them to take naltrexone and do not take hydrocodone.  Will send fyi to PCP.  Signed:  , MD           06/20/2021

## 2021-06-23 ENCOUNTER — Telehealth: Payer: Self-pay | Admitting: Internal Medicine

## 2021-06-23 ENCOUNTER — Other Ambulatory Visit: Payer: Self-pay

## 2021-06-23 ENCOUNTER — Encounter (HOSPITAL_BASED_OUTPATIENT_CLINIC_OR_DEPARTMENT_OTHER): Payer: Medicare Other | Attending: Internal Medicine | Admitting: Internal Medicine

## 2021-06-23 DIAGNOSIS — I110 Hypertensive heart disease with heart failure: Secondary | ICD-10-CM | POA: Diagnosis not present

## 2021-06-23 DIAGNOSIS — L97528 Non-pressure chronic ulcer of other part of left foot with other specified severity: Secondary | ICD-10-CM | POA: Insufficient documentation

## 2021-06-23 DIAGNOSIS — M86372 Chronic multifocal osteomyelitis, left ankle and foot: Secondary | ICD-10-CM | POA: Diagnosis not present

## 2021-06-23 DIAGNOSIS — I5032 Chronic diastolic (congestive) heart failure: Secondary | ICD-10-CM | POA: Diagnosis not present

## 2021-06-23 DIAGNOSIS — E11621 Type 2 diabetes mellitus with foot ulcer: Secondary | ICD-10-CM | POA: Insufficient documentation

## 2021-06-23 NOTE — Telephone Encounter (Signed)
Ok for verbals   BP should be less than 140/90, ideally < 130/80 - call if higher than 140/90

## 2021-06-23 NOTE — Telephone Encounter (Signed)
[phone] Tresa Endo [place]   Verbal orders  Approval for once a week 1 wk 1, 2 wk, 2, 1 wk 1 Twice two prn visits for complications related to the wound  Wound care cleaning left ankle wound, left diabetic foot infection, saline, medi honey to wound, covering with a dry dressing and securing with an ACE wrap, daily wound care, patient able to perform on days nurse is not available   150/120 over the weekend, MD on call started him on lopressor twice daily, any bp reading that Dr. Lawerance Bach would like to be called for?

## 2021-06-23 NOTE — Progress Notes (Signed)
[name], SPACE ([id number]) Visit Report for 06/23/2021 Debridement Details Patient Name: Date of Service: [name], [name] 06/23/2021 12:30 PM Medical Record Number: [id number] Patient Account Number: [id number] Date of Birth/Sex: Treating RN: March 23, 1961 (60 y.o. [name], [name] Primary Care Provider: [name] Other Clinician: Referring Provider: Treating Provider/Extender: Avie Echevaria in Treatment: 2 Debridement Performed for Assessment: Wound #3 Left,Plantar Foot Performed By: Physician Maxwell Caul., MD Debridement Type: Debridement Severity of Tissue Pre Debridement: Fat layer exposed Level of Consciousness (Pre-procedure): Awake and Alert Pre-procedure Verification/Time Out Yes - 12:51 Taken: Start Time: 12:51 Pain Control: Lidocaine T Area Debrided (L x W): otal 2.2 (cm) x 2.9 (cm) = 6.38 (cm) Tissue and other material debrided: Viable, Non-Viable, Callus, Slough, Subcutaneous, Skin: Dermis , Skin: Epidermis, Slough Level: Skin/Subcutaneous Tissue Debridement Description: Excisional Instrument: Curette Bleeding: Minimum Hemostasis Achieved: Pressure End Time: 12:51 Procedural Pain: 0 Post Procedural Pain: 0 Response to Treatment: Procedure was tolerated well Level of Consciousness (Post- Awake and Alert procedure): Post Debridement Measurements of Total Wound Length: (cm) 2.2 Width: (cm) 2.9 Depth: (cm) 0.1 Volume: (cm) 0.501 Character of Wound/Ulcer Post Debridement: Improved Severity of Tissue Post Debridement: Fat layer exposed Post Procedure Diagnosis Same as Pre-procedure Electronic Signature(s) Signed: 06/23/2021 5:13:37 PM By: Baltazar Najjar MD Signed: 06/23/2021 5:22:11 PM By: Fonnie Mu RN Entered By: Baltazar Najjar on 06/23/2021 13:14:13 -------------------------------------------------------------------------------- HPI Details Patient Name: Date of Service: [name] 06/23/2021 12:30 PM Medical Record  Number: [id number] Patient Account Number: [id number] Date of Birth/Sex: Treating RN: 09/15/1961 (60 y.o. [name] Primary Care Provider: [name] Other Clinician: Referring Provider: Treating Provider/Extender: Avie Echevaria in Treatment: 2 History of Present Illness HPI Description: 10/31/2019 upon evaluation today patient appears to be doing somewhat poorly in regard to his bilateral plantar feet. He has wounds that he tells me have been present since 2012 intermittently off and on. Most recently this has been open for at least the past 6 months to a year. He has been trying to treat this in different ways using Santyl along with various other dressings including Medihoney and even at one point Xeroform. Nothing really has seem to get this completely closed. He was recently in the hospital for cellulitis of his leg subsequently he did have x-rays as well as MRIs that showed negative for any signs of osteomyelitis in regard to the wounds on his feet. Fortunately there is no signs of systemic infection at this time. No fevers, chills, nausea, vomiting, or diarrhea. Patient has previously used Darco offloading shoes as far as frontal floaters as well as postop surgical shoes. He has never been in a total contact cast that may be something we need to strongly consider here. Patient's most recent hemoglobin A1c 1 month ago was 5.3 seems to be very well controlled which is great. Subsequently he has seen vascular as well as podiatry. His ABIs are 1.07 on the left and 1.14 on the right he seems to be doing well he does have chronic venous stasis. 11/07/2019 upon evaluation today patient appears to be doing well with regard to his wounds all things considered. I do not see any severe worsening he still has some callus buildup on the right more than the left he notes that he has been probably more active than he should as far as walking is concerned is just very hard to  not be active. He knows he needs to be more careful in this regard however. He  is willing to give the cast a try at this point although he notes that he is a little nervous about this just with regard to balance although he will be very careful and obviously if he has any trouble he knows to contact the office and let me know. 1/22; patient is in for his obligatory first total contact cast change. Our intake nurse reported a very large amount of drainage which is spelled out over to the surrounding skin. Has bilateral diabetic foot wounds. He has Charcot feet. We have been using silver alginate on his wounds. 11/14/2019 on evaluation today patient is actually seeming to make good improvement in regard to his bilateral plantar foot wounds. We have been using a cast on the left side and on the right side he has been using dressings he is changing up his own accord. With that being said he tells me that he is also not walking as much just due to how unsteady he feels. He takes it easy when he does have to walk and when he does not have to walk he is resting. This is probably help in his right foot as well has the left foot which is actually measuring better. In fact both are measuring better. Overall I am very pleased with how things seem to be progressing. The patient does have some odor on the left foot this does have me concerned about the possibility of infection, and actually probably go ahead and put him on antibiotics today as well as utilizing a continuation of the cast on the left foot I think that will be fine we probably just need to bring him in sooner to change this not last a whole week. 1/29; we brought the patient back today for a total contact cast change on the left out of concern for excessive drainage. We are using drawtex over the wound as the primary dressing 11/21/2019 on evaluation today patient appears to be doing well with regard to his left plantar foot. In fact both foot ulcers  actually seem to be doing pretty well. Nonetheless he is having a lot of drainage on the left at this time and again we did obtain a wound culture did show positive for Staph aureus that was reviewed by myself today as well. Nonetheless he is on Bactrim which was shown to be sensitive that should be helping in this regard. Fortunately there is no signs of infection systemically at this point. 2/5; back in clinic today for a total contact cast change apparently secondary to very significant drainage. Still using drawtex 11/28/2019 upon evaluation today patient appears to be doing well with regard to his wounds. The right foot is doing okay as measured about the same in my opinion. The left foot is actually showing signs of significant improvement is measuring smaller there is a lot of hyper granulation likely due to the continued drainage at this point. We did obtain approval for a snap VAC I think that is good to be appropriate for him and will likely help this tremendously underneath the cast. He is definitely in agreement with proceeding with such. 2/12; patient came in today after his snap VAC lost suction. Brought in to see one of our nurses. The dressing was replaced and then we put the cast back on and rehooked up the snap VAC. Apparently his wound looked very good per our intake nurse. 2/15; again we replaced the cast on Friday. By Saturday the snap VAC and light suction. He called  this morning he comes in acutely. The wounds look fine however the VAC is not functioning. We replaced the cast using silver alginate as the primary dressing backed with Kerramax. The snap VAC was not replaced 12/05/2019 upon evaluation today patient appears to be doing better in regard to his left plantar foot ulcer. Fortunately there is no signs of active infection at this time. Unfortunately he is continuing to have issues with the right foot he is really not making any progress here things seem to be somewhat  stagnant to be honest. The depth has increased but that is due to me having debrided the wound in the past based on what I am seeing. 12/12/2019 on evaluation today patient appears to be doing more poorly in regard to the left lower extremity. He has some erythema spreading up the side of his foot I am concerned about infection again at this point. Unfortunately he has been seeing improvement with a total contact cast but I do not think we should put that on today. On his right plantar foot he continues to have significant drainage this is actually measuring deeper I really do not feel like you are making any progress whatsoever. I have prescribed Granix for him unfortunately his insurance apparently was going to cost him a $500 co-pay. 12/19/2019 upon evaluation today patient actually appears to be doing better in regard to both wounds. With that being said he actually did get the reGranix which he had to pay $500 for. With that being said it does look like that he is actually made some improvement based on what I am seeing at this point with the reGranix. Obviously if he is going to continue this we are going to do something about trying to get him some help in covering the cost. 12/26/2019 on evaluation today patient appears to be doing really much better even compared to last week. Overall the wound seems to be much better even compared to last week and last week was better than the week before. Since has been using the reGranix his symptoms have improved significantly. With that being said the issue right now is simply that this is a very expensive medication for him the first dose cost him $500. Upon inspection patient's wound bed actually is however dramatically improved compared to before he started this 2 weeks ago. 01/02/2020 upon evaluation today patient appears to actually be doing well. He still had a little bit of reGranix left that has been using in small amounts he just been applying it  every other day instead of every day in order to make it go longer. Overall we are still seeing excellent improvement he is measuring smaller looking better healthier tissue and everything seems to be pointing to this headed in the right direction. Fortunately there is no evidence of infection either which is also excellent news. He does have his MRI coming up within the next week. 01/09/2020 upon evaluation today patient appears to be doing a little worse today compared to previous week's evaluation. He is actually been out of the reGranix at this point. He has been trying to make the stretch out so he has been changing the dressings on a regular set schedule like he was previous. I think this has made a difference. Fortunately there is no signs of active infection at this time. No fevers, chills, nausea, vomiting, or diarrhea. 01/16/2020 upon evaluation today patient appears to be doing well with regard to his left plantar foot ulcer. The right  plantar foot still shows some significant depth at this point. Fortunately there is no signs of active infection at this time. 01/30/2020 upon evaluation today patient appears to be doing about the same in regard to his right plantar foot ulcer there is still some depth here and we had to wait till he actually switched over to his new insurance to get approval for the MRI under his new insurance plan. With that being said he now has switched as of April 1. Fortunately there is no signs of active infection at this time. Overall in regard to his left foot ulcer this seems to be doing much better and I am actually very pleased with how things are going. With that being said it is not quite as much progress as we were seeing with the reGranix but at the same time he has had trouble getting this apparently there is been some hindrance here. I Ernie Hew try to actually send this to [facility] that was recommended by the drug rep to me. 02/13/20 upon evaluation today  patient appears to be doing better in regard to his left foot ulcer this is great news. Unfortunately the right foot ulcer is not really significantly better at this time. There is no signs of active infection systemically though he did have his MRI which showed unfortunately he does have infection noted including an abscess in the foot. There is also marrow changes noted which are consistent with osteomyelitis based on the radiology review and interpretation. Unfortunately considering that the wound is not really making the progress that we will he would like to be seen I think that this is an indication that he may need some further referral both infectious disease as well as potentially to podiatrist to see if there is anything that can be done to help with the situation that were dealing with here. The left foot again is doing great. READMISSION 06/04/2021 This is a 60 year old man who was in the clinic in 2021 followed by Allen Derry for areas on the right and left foot. He developed a left foot infection and was referred to ID. He left the clinic in a nonhealed state and was followed for a period of [facility] Marylene Land. Apparently things really deteriorated in early July when he was admitted to hospital from 04/27/2021 through 05/06/2021 with sepsis secondary to a left foot infection. His blood cultures were negative. An MRI suggested fifth metatarsal osteomyelitis a left ankle septic joint. He was treated with vancomycin and ertapenem which he is still taking and may just about be finishing. He was seen by orthopedics and the patient adamantly refused to BKA. As far as I can tell he did not have the ankle aspirated I am not exactly sure what the issue was here. Since he has been discharged she is at [facility] health care for rehabilitation. He was last seen by Dr. Algis Liming on 05/20/2021 he noted osteo of the tibial talar bone cuboid and fifth metatarsal which is even more extensive  than what was suggested by the MRI. He is apparently going for a consultation with orthopedic surgery in [place] sometime next week. I received a call about this man 2 weeks ago from Dr. Allyson Sabal who follows him for the possibility of PAD. ABIs I think done in the office showed a ABI on the right of 1.05 at the PTA and 0.99 at the PTA on the left. He had a DVT rule out in the left leg that  was negative for the DVT. Past medical history is extensive and includes diastolic heart failure, right first metatarsal head ulcer in 2021, excision of the right second ray by Dr. Marylene Land on 03/13/2020, hypertension, hypothyroidism. Left total hip replacement, right total knee replacement, carpal tunnel syndrome, obstructive sleep apnea alcohol abuse with cirrhosis although the patient denies current alcohol intake. The patient does not think he is a diabetic however looking through [facility] I see 2 HgbAic's of this year that were greater than equal to 6.5 which by definition makes him diabetic. Nevertheless he is not on any treatment and does not check his blood sugars. The patient is now back home out of the nursing home. Saw Dr. Algis Liming last week he was taken off vancomycin and ertapenem on August 22 and now is on doxycycline on Augmentin. He also saw Dr. Weston Anna who is his orthopedic surgeon in [place] he recommended a [employer]	[place]. He has been using Medihoney. I have been having trouble getting hyperbarics approved through our prior authorization process. Even though he had a limb threatening infection in the left foot and probably the left ankle there glitches in how some of the reports are worded also some of the consultants. In any case I am going to repeat his sedimentation rate and C-reactive protein. I am generally not in favor of doing things like this as they really do not alter the plan of care from my point of view however I am going to need to demonstrate that these remain high in order to get  this through forhyperbaric treatment for chronic refractory osteomyelitis Electronic Signature(s) Signed: 06/23/2021 5:13:37 PM By: Baltazar Najjar MD Entered By: Baltazar Najjar on 06/23/2021 13:17:12 -------------------------------------------------------------------------------- Physical Exam Details Patient Name: Date of Service: [name] 06/23/2021 12:30 PM Medical Record Number: [id number] Patient Account Number: [id number] Date of Birth/Sex: Treating RN: [birth year] (60 y.o. [name] Primary Care Provider: [name] Other Clinician: Referring Provider: Treating Provider/Extender: Avie Echevaria in Treatment: 2 Constitutional Sitting or standing Blood Pressure is within target range for patient.. Pulse regular and within target range for patient.Marland Kitchen Respirations regular, non-labored and within target range.. Temperature is normal and within the target range for the patient.Marland Kitchen Appears in no distress. Notes Wound exam; mid part of the left plantar foot. This actually looks some better than the last time I saw this 2-1/2 weeks ago. Not much different in terms of dimensions however the surface of the wound looks better. There is no evidence of surrounding infection and certainly no exposed bone. I used a #5 curette to clean up some debris on the surface otherwise this looked quite good. Hemostasis with silver nitrate and a pressure dressing Electronic Signature(s) Signed: 06/23/2021 5:13:37 PM By: Baltazar Najjar MD Entered By: Baltazar Najjar on 06/23/2021 13:18:48 -------------------------------------------------------------------------------- Physician Orders Details Patient Name: Date of Service: [name], [name] [name] 06/23/2021 12:30 PM Medical Record Number: [id number] Patient Account Number: [id number] Date of Birth/Sex: Treating RN: September 23, 1961 (60 y.o. [name] Primary Care Provider: [name] Other Clinician: Referring Provider: Treating  Provider/Extender: Avie Echevaria in Treatment: 2 Verbal / Phone Orders: No Diagnosis Coding Follow-up Appointments ppointment in 2 weeks. - with Dr. Leanord Hawking Return A Bathing/ Shower/ Hygiene May shower and wash wound with soap and water. - prior to dressing change Edema Control - Lymphedema / SCD / Other Elevate legs to the level of the heart or above for 30 minutes daily and/or when sitting, a frequency of: -  throughout the day Avoid standing for long periods of time. Exercise regularly Moisturize legs daily. Hyperbaric Oxygen Therapy Wound #3 Left,Plantar Foot Evaluate for HBO Therapy Indication: - Chronic Refractory Osteomyelitis 2.5 ATA for 90 Minutes with 2 Five (5) Minute A Breaks ir Total Number of Treatments: - 40 One treatments per day (delivered Monday through Friday unless otherwise specified in Special Instructions below): Finger stick Blood Glucose Pre- and Post- HBOT Treatment. Follow Hyperbaric Oxygen Glycemia Protocol Afrin (Oxymetazoline HCL) 0.05% nasal spray - 1 spray in both nostrils daily as needed prior to HBO treatment for difficulty clearing ears Wound Treatment Wound #3 - Foot Wound Laterality: Plantar, Left Cleanser: Soap and Water 1 x Per Day Discharge Instructions: May shower and wash wound with dial antibacterial soap and water prior to dressing change. Cleanser: Wound Cleanser 1 x Per Day Discharge Instructions: Cleanse the wound with wound cleanser prior to applying a clean dressing using gauze sponges, not tissue or cotton balls. Prim Dressing: MediHoney Calcium Alginate Dressing 4x5 in 1 x Per Day ary Discharge Instructions: Apply to wound bed as instructed Secondary Dressing: Woven Gauze Sponge, Non-Sterile 4x4 in 1 x Per Day Discharge Instructions: Apply over primary dressing as directed. Secondary Dressing: ABD Pad, 5x9 1 x Per Day Discharge Instructions: Apply over primary dressing as directed. Secured With: [employer], 4.5x3.1 (in/yd) 1 x Per Day Discharge Instructions: Secure with Kerlix as directed. Secured With: 71M Medipore H Soft Cloth Surgical Tape, 2x2 (in/yd) 1 x Per Day Discharge Instructions: Secure dressing with tape as directed. Laboratory Erythrocyte sedimentation rate (HEM) LOINC Code: [id number] Convenience Name: Sed rate-method unspecified C reactive protein [Mass/volume] in Serum or Plasma (CHEM) LOINC Code: 1988-5 Convenience Name: C Reactive Protein in serum or plasma GLYCEMIA INTERVENTIONS PROTOCOL PRE-HBO GLYCEMIA INTERVENTIONS ACTION INTERVENTION Obtain pre-HBO capillary blood glucose (ensure 1 physician order is in chart). A. Notify HBO physician and await physician orders. 2 If result is 70 mg/dl or below: B. If the result meets the hospital definition of a critical result, follow hospital policy. A. Give patient an 8 ounce Glucerna Shake, an 8 ounce Ensure, or 8 ounces of a Glucerna/Ensure equivalent dietary supplement*. B. Wait 30 minutes. If result is 71 mg/dl to 161 mg/dl: C. Retest patients capillary blood glucose (CBG). D. If result greater than or equal to 110 mg/dl, proceed with HBO. If result less than 110 mg/dl, notify HBO physician and consider holding HBO. If result is 131 mg/dl to 096 mg/dl: A. Proceed with HBO. A. Notify HBO physician and await physician orders. B. It is recommended to hold HBO and do If result is 250 mg/dl or greater: blood/urine ketone testing. C. If the result meets the hospital definition of a critical result, follow hospital policy. POST-HBO GLYCEMIA INTERVENTIONS ACTION INTERVENTION Obtain post HBO capillary blood glucose (ensure 1 physician order is in chart). A. Notify HBO physician and await physician orders. 2 If result is 70 mg/dl or below: B. If the result meets the hospital definition of a critical result, follow hospital policy. A. Give patient an 8 ounce Glucerna Shake, an 8 ounce Ensure, or 8  ounces of a Glucerna/Ensure equivalent dietary supplement*. B. Wait 15 minutes for symptoms of If result is 71 mg/dl to 045 mg/dl: hypoglycemia (i.e. nervousness, anxiety, sweating, chills, clamminess, irritability, confusion, tachycardia or dizziness). C. If patient asymptomatic, discharge patient. If patient symptomatic, repeat capillary blood glucose (CBG) and notify HBO physician. If result is 101 mg/dl to 409 mg/dl: A. Discharge patient. A. Notify  HBO physician and await physician orders. B. It is recommended to do blood/urine ketone If result is 250 mg/dl or greater: testing. C. If the result meets the hospital definition of a critical result, follow hospital policy. *Juice or candies are NOT equivalent products. If patient refuses the Glucerna or Ensure, please consult the hospital dietitian for an appropriate substitute. Electronic Signature(s) Signed: 06/23/2021 5:13:37 PM By: Baltazar Najjar MD Signed: 06/23/2021 5:22:11 PM By: Fonnie Mu RN Entered By: Fonnie Mu on 06/23/2021 12:50:47 Prescription 06/23/2021 -------------------------------------------------------------------------------- [name] MD Patient Name: Provider: 1960/12/14 [id number] Date of Birth: NPI#: Judie Petit [id number] Sex: DEA #: [phone] [id number] Phone #: License #: Eligha Bridegroom [facility] Patient Address: [address] [place], [place] [address] [address] [place], [place] [address] [phone] Allergies Claritin Provider's Orders Erythrocyte sedimentation rate LOINC Code: 66440-3 Convenience Name: Sed rate-method unspecified Hand Signature: Date(s): Prescription 06/23/2021 [name] MD Patient Name: Provider: 1961-08-03 [id number] Date of Birth: NPI#: Judie Petit [id number] Sex: DEA #: [phone] Phone #: License #: Eligha Bridegroom [facility] Patient Address: [address] [place], [place] [address] [address] [place], [place] [address] [phone] Allergies Claritin Provider's Orders C reactive protein [Mass/volume] in Serum or Plasma LOINC Code: 1988-5 Convenience Name: C Reactive Protein in serum or plasma Hand Signature: Date(s): Electronic Signature(s) Signed: 06/23/2021 5:13:37 PM By: Baltazar Najjar MD Signed: 06/23/2021 5:22:11 PM By: Fonnie Mu RN Entered By: Fonnie Mu on 06/23/2021 12:50:48 -------------------------------------------------------------------------------- Problem List Details Patient Name: Date of Service: [name], [name] [name] 06/23/2021 12:30 PM Medical Record Number: [id number] Patient Account Number: [id number] Date of Birth/Sex: Treating RN: 21-May-1961 (60 y.o. [name] Primary Care Provider: [name] Other Clinician: Referring Provider: Treating Provider/Extender: Avie Echevaria in Treatment: 2 Active Problems ICD-10 Encounter Code Description Active Date MDM Diagnosis E11.621 Type 2 diabetes mellitus with foot ulcer 06/04/2021 No Yes M14.672 Charcot's joint, left ankle and foot 06/04/2021 No Yes L97.528 Non-pressure chronic ulcer of other part of left foot with other specified 06/04/2021 No Yes severity M86.372 Chronic multifocal osteomyelitis, left ankle and foot 06/04/2021 No Yes Inactive Problems Resolved Problems Electronic Signature(s) Signed: 06/23/2021 5:13:37 PM By: Baltazar Najjar MD Entered By: Baltazar Najjar on 06/23/2021 13:13:49 -------------------------------------------------------------------------------- Progress Note Details Patient Name: Date of Service: [name] 06/23/2021 12:30 PM Medical Record Number: [id number] Patient Account Number: [id number] Date of Birth/Sex: Treating RN: 19-Mar-1961 (59 y.o. [name] Primary Care Provider: [name] Other Clinician: Referring Provider: Treating Provider/Extender: Avie Echevaria in Treatment: 2 Subjective History of Present Illness (HPI) 10/31/2019 upon evaluation today patient appears to be doing somewhat poorly in regard to his bilateral plantar feet. He has wounds that he tells me have been present since 2012 intermittently off and on. Most recently this has been open for at least the past 6 months to a year. He has been trying to treat this in different ways using Santyl along with various other dressings including Medihoney and even at one point Xeroform. Nothing really has seem to get this completely closed. He was recently in the hospital for cellulitis of his leg subsequently he did have x-rays as well as MRIs that showed negative for any signs of osteomyelitis in regard to the wounds on his feet. Fortunately there is no signs of systemic infection at this time. No fevers, chills, nausea, vomiting, or diarrhea. Patient has previously used  Darco offloading shoes as far as frontal floaters as well as postop surgical shoes. He has never been in a total contact cast that may be something we need to strongly consider here. Patient's most recent hemoglobin A1c 1 month ago was 5.3 seems to be very well controlled which is great. Subsequently he has seen vascular as well as podiatry. His ABIs are 1.07 on the left and 1.14 on the right he seems to be doing well he does have chronic venous stasis. 11/07/2019 upon evaluation today patient appears to be doing well with regard to his wounds all things considered. I do not see any severe worsening he still has some callus buildup on the right more than the left he notes that he has been probably more active than he should as far as walking is concerned is just very hard to not be active. He knows he needs to be more careful in this regard however. He is willing to give the cast a try at this point although he notes that he is a little nervous about this just with regard to balance although he will be very  careful and obviously if he has any trouble he knows to contact the office and let me know. 1/22; patient is in for his obligatory first total contact cast change. Our intake nurse reported a very large amount of drainage which is spelled out over to the surrounding skin. Has bilateral diabetic foot wounds. He has Charcot feet. We have been using silver alginate on his wounds. 11/14/2019 on evaluation today patient is actually seeming to make good improvement in regard to his bilateral plantar foot wounds. We have been using a cast on the left side and on the right side he has been using dressings he is changing up his own accord. With that being said he tells me that he is also not walking as much just due to how unsteady he feels. He takes it easy when he does have to walk and when he does not have to walk he is resting. This is probably help in his right foot as well has the left foot which is actually measuring better. In fact both are measuring better. Overall I am very pleased with how things seem to be progressing. The patient does have some odor on the left foot this does have me concerned about the possibility of infection, and actually probably go ahead and put him on antibiotics today as well as utilizing a continuation of the cast on the left foot I think that will be fine we probably just need to bring him in sooner to change this not last a whole week. 1/29; we brought the patient back today for a total contact cast change on the left out of concern for excessive drainage. We are using drawtex over the wound as the primary dressing 11/21/2019 on evaluation today patient appears to be doing well with regard to his left plantar foot. In fact both foot ulcers actually seem to be doing pretty well. Nonetheless he is having a lot of drainage on the left at this time and again we did obtain a wound culture did show positive for Staph aureus that was reviewed by myself today as well. Nonetheless  he is on Bactrim which was shown to be sensitive that should be helping in this regard. Fortunately there is no signs of infection systemically at this point. 2/5; back in clinic today for a total contact cast change apparently secondary to very significant  drainage. Still using drawtex 11/28/2019 upon evaluation today patient appears to be doing well with regard to his wounds. The right foot is doing okay as measured about the same in my opinion. The left foot is actually showing signs of significant improvement is measuring smaller there is a lot of hyper granulation likely due to the continued drainage at this point. We did obtain approval for a snap VAC I think that is good to be appropriate for him and will likely help this tremendously underneath the cast. He is definitely in agreement with proceeding with such. 2/12; patient came in today after his snap VAC lost suction. Brought in to see one of our nurses. The dressing was replaced and then we put the cast back on and rehooked up the snap VAC. Apparently his wound looked very good per our intake nurse. 2/15; again we replaced the cast on Friday. By Saturday the snap VAC and light suction. He called this morning he comes in acutely. The wounds look fine however the VAC is not functioning. We replaced the cast using silver alginate as the primary dressing backed with Kerramax. The snap VAC was not replaced 12/05/2019 upon evaluation today patient appears to be doing better in regard to his left plantar foot ulcer. Fortunately there is no signs of active infection at this time. Unfortunately he is continuing to have issues with the right foot he is really not making any progress here things seem to be somewhat stagnant to be honest. The depth has increased but that is due to me having debrided the wound in the past based on what I am seeing. 12/12/2019 on evaluation today patient appears to be doing more poorly in regard to the left lower extremity.  He has some erythema spreading up the side of his foot I am concerned about infection again at this point. Unfortunately he has been seeing improvement with a total contact cast but I do not think we should put that on today. On his right plantar foot he continues to have significant drainage this is actually measuring deeper I really do not feel like you are making any progress whatsoever. I have prescribed Granix for him unfortunately his insurance apparently was going to cost him a $500 co-pay. 12/19/2019 upon evaluation today patient actually appears to be doing better in regard to both wounds. With that being said he actually did get the reGranix which he had to pay $500 for. With that being said it does look like that he is actually made some improvement based on what I am seeing at this point with the reGranix. Obviously if he is going to continue this we are going to do something about trying to get him some help in covering the cost. 12/26/2019 on evaluation today patient appears to be doing really much better even compared to last week. Overall the wound seems to be much better even compared to last week and last week was better than the week before. Since has been using the reGranix his symptoms have improved significantly. With that being said the issue right now is simply that this is a very expensive medication for him the first dose cost him $500. Upon inspection patient's wound bed actually is however dramatically improved compared to before he started this 2 weeks ago. 01/02/2020 upon evaluation today patient appears to actually be doing well. He still had a little bit of reGranix left that has been using in small amounts he just been applying it every other day  instead of every day in order to make it go longer. Overall we are still seeing excellent improvement he is measuring smaller looking better healthier tissue and everything seems to be pointing to this headed in the right direction.  Fortunately there is no evidence of infection either which is also excellent news. He does have his MRI coming up within the next week. 01/09/2020 upon evaluation today patient appears to be doing a little worse today compared to previous week's evaluation. He is actually been out of the reGranix at this point. He has been trying to make the stretch out so he has been changing the dressings on a regular set schedule like he was previous. I think this has made a difference. Fortunately there is no signs of active infection at this time. No fevers, chills, nausea, vomiting, or diarrhea. 01/16/2020 upon evaluation today patient appears to be doing well with regard to his left plantar foot ulcer. The right plantar foot still shows some significant depth at this point. Fortunately there is no signs of active infection at this time. 01/30/2020 upon evaluation today patient appears to be doing about the same in regard to his right plantar foot ulcer there is still some depth here and we had to wait till he actually switched over to his new insurance to get approval for the MRI under his new insurance plan. With that being said he now has switched as of April 1. Fortunately there is no signs of active infection at this time. Overall in regard to his left foot ulcer this seems to be doing much better and I am actually very pleased with how things are going. With that being said it is not quite as much progress as we were seeing with the reGranix but at the same time he has had trouble getting this apparently there is been some hindrance here. I Ernie Hew try to actually send this to [facility] that was recommended by the drug rep to me. 02/13/20 upon evaluation today patient appears to be doing better in regard to his left foot ulcer this is great news. Unfortunately the right foot ulcer is not really significantly better at this time. There is no signs of active infection systemically though he did have his MRI  which showed unfortunately he does have infection noted including an abscess in the foot. There is also marrow changes noted which are consistent with osteomyelitis based on the radiology review and interpretation. Unfortunately considering that the wound is not really making the progress that we will he would like to be seen I think that this is an indication that he may need some further referral both infectious disease as well as potentially to podiatrist to see if there is anything that can be done to help with the situation that were dealing with here. The left foot again is doing great. READMISSION 06/04/2021 This is a 60 year old man who was in the clinic in 2021 followed by Allen Derry for areas on the right and left foot. He developed a left foot infection and was referred to ID. He left the clinic in a nonhealed state and was followed for a period of [facility] Marylene Land. Apparently things really deteriorated in early July when he was admitted to hospital from 04/27/2021 through 05/06/2021 with sepsis secondary to a left foot infection. His blood cultures were negative. An MRI suggested fifth metatarsal osteomyelitis a left ankle septic joint. He was treated with vancomycin and ertapenem which he is  still taking and may just about be finishing. He was seen by orthopedics and the patient adamantly refused to BKA. As far as I can tell he did not have the ankle aspirated I am not exactly sure what the issue was here. Since he has been discharged she is at [facility] health care for rehabilitation. He was last seen by Dr. Algis Liming on 05/20/2021 he noted osteo of the tibial talar bone cuboid and fifth metatarsal which is even more extensive than what was suggested by the MRI. He is apparently going for a consultation with orthopedic surgery in [place] sometime next week. I received a call about this man 2 weeks ago from Dr. Allyson Sabal who follows him for the possibility of PAD. ABIs I  think done in the office showed a ABI on the right of 1.05 at the PTA and 0.99 at the PTA on the left. He had a DVT rule out in the left leg that was negative for the DVT. Past medical history is extensive and includes diastolic heart failure, right first metatarsal head ulcer in 2021, excision of the right second ray by Dr. Marylene Land on 03/13/2020, hypertension, hypothyroidism. Left total hip replacement, right total knee replacement, carpal tunnel syndrome, obstructive sleep apnea alcohol abuse with cirrhosis although the patient denies current alcohol intake. The patient does not think he is a diabetic however looking through [facility] link I see 2 HgbAic's of this year that were greater than equal to 6.5 which by definition makes him diabetic. Nevertheless he is not on any treatment and does not check his blood sugars. The patient is now back home out of the nursing home. Saw Dr. Algis Liming last week he was taken off vancomycin and ertapenem on August 22 and now is on doxycycline on Augmentin. He also saw Dr. Weston Anna who is his orthopedic surgeon in [place] he recommended a [employer]	[place]. He has been using Medihoney. I have been having trouble getting hyperbarics approved through our prior authorization process. Even though he had a limb threatening infection in the left foot and probably the left ankle there glitches in how some of the reports are worded also some of the consultants. In any case I am going to repeat his sedimentation rate and C-reactive protein. I am generally not in favor of doing things like this as they really do not alter the plan of care from my point of view however I am going to need to demonstrate that these remain high in order to get this through forhyperbaric treatment for chronic refractory osteomyelitis Objective Constitutional Sitting or standing Blood Pressure is within target range for patient.. Pulse regular and within target range for patient.Marland Kitchen Respirations regular,  non-labored and within target range.. Temperature is normal and within the target range for the patient.Marland Kitchen Appears in no distress. Vitals Time Taken: 12:41 PM, Height: 73 in, Weight: 270 lbs, BMI: 35.6, Temperature: 97.7 F, Pulse: 74 bpm, Respiratory Rate: 17 breaths/min, Blood Pressure: 124/83 mmHg. General Notes: Wound exam; mid part of the left plantar foot. This actually looks some better than the last time I saw this 2-1/2 weeks ago. Not much different in terms of dimensions however the surface of the wound looks better. There is no evidence of surrounding infection and certainly no exposed bone. I used a #5 curette to clean up some debris on the surface otherwise this looked quite good. Hemostasis with silver nitrate and a pressure dressing Integumentary (Hair, Skin) Wound #3 status is Open. Original cause of wound was Gradually  Appeared. The date acquired was: 03/18/2021. The wound has been in treatment 2 weeks. The wound is located on the Left,Plantar Foot. The wound measures 2.2cm length x 2.9cm width x 0.1cm depth; 5.011cm^2 area and 0.501cm^3 volume. There is Fat Layer (Subcutaneous Tissue) exposed. There is no tunneling or undermining noted. There is a medium amount of serosanguineous drainage noted. The wound margin is flat and intact. There is large (67-100%) red granulation within the wound bed. There is no necrotic tissue within the wound bed. Assessment Active Problems ICD-10 Type 2 diabetes mellitus with foot ulcer Charcot's joint, left ankle and foot Non-pressure chronic ulcer of other part of left foot with other specified severity Chronic multifocal osteomyelitis, left ankle and foot Procedures Wound #3 Pre-procedure diagnosis of Wound #3 is a Diabetic Wound/Ulcer of the Lower Extremity located on the Left,Plantar Foot .Severity of Tissue Pre Debridement is: Fat layer exposed. There was a Excisional Skin/Subcutaneous Tissue Debridement with a total area of 6.38 sq cm  performed by Maxwell Caul., MD. With the following instrument(s): Curette to remove Viable and Non-Viable tissue/material. Material removed includes Callus, Subcutaneous Tissue, Slough, Skin: Dermis, and Skin: Epidermis after achieving pain control using Lidocaine. No specimens were taken. A time out was conducted at 12:51, prior to the start of the procedure. A Minimum amount of bleeding was controlled with Pressure. The procedure was tolerated well with a pain level of 0 throughout and a pain level of 0 following the procedure. Post Debridement Measurements: 2.2cm length x 2.9cm width x 0.1cm depth; 0.501cm^3 volume. Character of Wound/Ulcer Post Debridement is improved. Severity of Tissue Post Debridement is: Fat layer exposed. Post procedure Diagnosis Wound #3: Same as Pre-Procedure Plan Follow-up Appointments: Return Appointment in 2 weeks. - with Dr. Leanord Hawking Bathing/ Shower/ Hygiene: May shower and wash wound with soap and water. - prior to dressing change Edema Control - Lymphedema / SCD / Other: Elevate legs to the level of the heart or above for 30 minutes daily and/or when sitting, a frequency of: - throughout the day Avoid standing for long periods of time. Exercise regularly Moisturize legs daily. Hyperbaric Oxygen Therapy: Wound #3 Left,Plantar Foot: Evaluate for HBO Therapy Indication: - Chronic Refractory Osteomyelitis 2.5 ATA for 90 Minutes with 2 Five (5) Minute Air Breaks T Number of Treatments: - 40 otal One treatments per day (delivered Monday through Friday unless otherwise specified in Special Instructions below): Finger stick Blood Glucose Pre- and Post- HBOT Treatment. Follow Hyperbaric Oxygen Glycemia Protocol Afrin (Oxymetazoline HCL) 0.05% nasal spray - 1 spray in both nostrils daily as needed prior to HBO treatment for difficulty clearing ears Laboratory ordered were: Sed rate -method unspecified, C Reactive Protein in serum or plasma WOUND #3: - Foot  Wound Laterality: Plantar, Left Cleanser: Soap and Water 1 x Per Day/ Discharge Instructions: May shower and wash wound with dial antibacterial soap and water prior to dressing change. Cleanser: Wound Cleanser 1 x Per Day/ Discharge Instructions: Cleanse the wound with wound cleanser prior to applying a clean dressing using gauze sponges, not tissue or cotton balls. Prim Dressing: MediHoney Calcium Alginate Dressing 4x5 in 1 x Per Day/ ary Discharge Instructions: Apply to wound bed as instructed Secondary Dressing: Woven Gauze Sponge, Non-Sterile 4x4 in 1 x Per Day/ Discharge Instructions: Apply over primary dressing as directed. Secondary Dressing: ABD Pad, 5x9 1 x Per Day/ Discharge Instructions: Apply over primary dressing as directed. Secured With: [employer], 4.5x3.1 (in/yd) 1 x Per Day/ Discharge Instructions: Secure with  Kerlix as directed. Secured With: 41M Medipore H Soft Cloth Surgical T ape, 2x2 (in/yd) 1 x Per Day/ Discharge Instructions: Secure dressing with tape as directed. 1. I am going to continue with the Medihoney for another week consider Hydrofera Blue next week. 2. I would recommend a total contact cast rather than the [facility] boot suggested by orthopedics Dr. Weston Anna 3. The patient is now on doxycycline and Augmentin after completing his IV antibiotics suggested by Dr. Zenaida Niece dam 4. This patient had a life-threatening infection on presentation with underlying osteomyelitis. An amputation was suggested at the time although the patient wanted to pursue medical therapy and still wishes to attempt to save the foot. Therefore I am still trying to get hyperbaric oxygen approved. 5. We will look towards putting a total contact cast on this next week Electronic Signature(s) Signed: 06/23/2021 5:13:37 PM By: Baltazar Najjar MD Entered By: Baltazar Najjar on 06/23/2021 13:21:01 -------------------------------------------------------------------------------- SuperBill  Details Patient Name: Date of Service: [name] 06/23/2021 Medical Record Number: [id number] Patient Account Number: [id number] Date of Birth/Sex: Treating RN: 22-Mar-1961 (60 y.o. [name], [name] Primary Care Provider: [name] Other Clinician: Referring Provider: Treating Provider/Extender: Avie Echevaria in Treatment: 2 Diagnosis Coding ICD-10 Codes Code Description [phone] Type 2 diabetes mellitus with foot ulcer M14.672 Charcot's joint, left ankle and foot L97.528 Non-pressure chronic ulcer of other part of left foot with other specified severity M86.372 Chronic multifocal osteomyelitis, left ankle and foot Facility Procedures CPT4 Code: [id number] Description: 11042 - DEB SUBQ TISSUE 20 SQ CM/< ICD-10 Diagnosis Description L97.528 Non-pressure chronic ulcer of other part of left foot with other specified seve Modifier: rity Quantity: 1 Physician Procedures : CPT4 Code Description Modifier [id number] 11042 - WC PHYS SUBQ TISS 20 SQ CM ICD-10 Diagnosis Description L97.528 Non-pressure chronic ulcer of other part of left foot with other specified severity Quantity: 1 Electronic Signature(s) Signed: 06/23/2021 5:13:37 PM By: Baltazar Najjar MD Entered By: Baltazar Najjar on 06/23/2021 13:21:11

## 2021-06-24 ENCOUNTER — Telehealth: Payer: Self-pay

## 2021-06-24 ENCOUNTER — Telehealth: Payer: Self-pay | Admitting: Internal Medicine

## 2021-06-24 ENCOUNTER — Ambulatory Visit (INDEPENDENT_AMBULATORY_CARE_PROVIDER_SITE_OTHER): Payer: Medicare Other | Admitting: *Deleted

## 2021-06-24 DIAGNOSIS — I5032 Chronic diastolic (congestive) heart failure: Secondary | ICD-10-CM

## 2021-06-24 DIAGNOSIS — R7303 Prediabetes: Secondary | ICD-10-CM

## 2021-06-24 NOTE — Telephone Encounter (Signed)
Verbals given to Kelly today. 

## 2021-06-24 NOTE — Telephone Encounter (Signed)
Please advise as Sharyl Nimrod with [facility] states pt had a Home Health OT eval and does not require any further assistance for OT at this time.  Sharyl Nimrod [phone].

## 2021-06-24 NOTE — Telephone Encounter (Signed)
[facility] FYI 06/20/2021  Caller states she is a [facility] nurse and is needing to let the doctor know about a patients blood pressure that is 150/120. His HR is 104. He just came home from [facility]. He was septic due to diabetic ulcer on his foot. He is getting IV antibiotics.They discharged him on Hydrocodone & Naltrexone. There is a warning to not use together. No symptoms  Call was sent to the ON-CALL provider. Dr.McGowen was notified and was going to call the [facility] nurse back to clarify medication.   Consult note is scanned into Media

## 2021-06-24 NOTE — Telephone Encounter (Signed)
Okay.  Not sure if he still getting PT or not and what they need advising on

## 2021-06-25 ENCOUNTER — Other Ambulatory Visit (HOSPITAL_COMMUNITY)
Admission: RE | Admit: 2021-06-25 | Discharge: 2021-06-25 | Disposition: A | Discharge status: Home / Self Care | Payer: Medicare Other | Source: Other Acute Inpatient Hospital | Attending: Internal Medicine | Admitting: Internal Medicine

## 2021-06-25 DIAGNOSIS — R700 Elevated erythrocyte sedimentation rate: Secondary | ICD-10-CM | POA: Insufficient documentation

## 2021-06-25 LAB — SEDIMENTATION RATE: Sed Rate: 70 mm/hr — ABNORMAL HIGH (ref 0–16)

## 2021-06-25 LAB — C-REACTIVE PROTEIN: CRP: 1.4 mg/dL — ABNORMAL HIGH (ref <1.0)

## 2021-06-25 NOTE — Progress Notes (Signed)
[name] ([id number]) Visit Report for 06/23/2021 Arrival Information Details Patient Name: Date of Service: [name] 06/23/2021 12:30 PM Medical Record Number: [id number] Patient Account Number: [id number] Date of Birth/Sex: Treating RN: Mar 04, 1961 (60 y.o. [name] Primary Care Lynia Landry: [name] Other Clinician: Referring Eurika Sandy: Treating Josimar Corning/Extender: Avie Echevaria in Treatment: 2 Visit Information History Since Last Visit Added or deleted any medications: No Patient Arrived: Wheel Chair Any new allergies or adverse reactions: No Arrival Time: 12:41 Had a fall or experienced change in No Accompanied By: wife activities of daily living that may affect Transfer Assistance: None risk of falls: Patient Identification Verified: Yes Signs or symptoms of abuse/neglect since last visito No Secondary Verification Process Completed: Yes Hospitalized since last visit: No Patient Requires Transmission-Based Precautions: No Implantable device outside of the clinic excluding No Patient Has Alerts: Yes cellular tissue based products placed in the center Patient Alerts: L ABI: 0.99 since last visit: R ABI: 1.05 Has Dressing in Place as Prescribed: Yes Pain Present Now: Yes Electronic Signature(s) Signed: 06/23/2021 5:22:11 PM By: Fonnie Mu RN Entered By: Fonnie Mu on 06/23/2021 12:41:27 -------------------------------------------------------------------------------- Encounter Discharge Information Details Patient Name: Date of Service: [name] 06/23/2021 12:30 PM Medical Record Number: [id number] Patient Account Number: [id number] Date of Birth/Sex: Treating RN: 08-Jun-1961 (60 y.o. [name] Primary Care Nico Syme: [name] Other Clinician: Referring Thane Age: Treating Citlalic Norlander/Extender: Avie Echevaria in Treatment: 2 Encounter Discharge Information Items Post Procedure  Vitals Discharge Condition: Stable Temperature (F): 97.4 Ambulatory Status: Wheelchair Pulse (bpm): 74 Discharge Destination: Home Respiratory Rate (breaths/min): 18 Transportation: Private Auto Blood Pressure (mmHg): 134/74 Accompanied By: wifey Schedule Follow-up Appointment: Yes Clinical Summary of Care: Electronic Signature(s) Signed: 06/23/2021 5:22:11 PM By: Fonnie Mu RN Entered By: Fonnie Mu on 06/23/2021 17:04:27 -------------------------------------------------------------------------------- Lower Extremity Assessment Details Patient Name: Date of Service: [name] [name] 06/23/2021 12:30 PM Medical Record Number: [id number] Patient Account Number: [id number] Date of Birth/Sex: Treating RN: 1961/04/16 (60 y.o. [name] Primary Care Alvy Alsop: [name] Other Clinician: Referring Amoni Scallan: Treating Devonia Farro/Extender: Avie Echevaria in Treatment: 2 Edema Assessment Assessed: [name]: Yes] [Right: No] Edema: [Left: Ye] [Right: s] Calf Left: Right: Point of Measurement: From Medial Instep 40 cm Ankle Left: Right: Point of Measurement: From Medial Instep 24 cm Vascular Assessment Pulses: Dorsalis Pedis Palpable: [Left:Yes] Posterior Tibial Palpable: [Left:Yes] Electronic Signature(s) Signed: 06/23/2021 5:22:11 PM By: Fonnie Mu RN Entered By: Fonnie Mu on 06/23/2021 12:43:02 -------------------------------------------------------------------------------- Multi Wound Chart Details Patient Name: Date of Service: [name] 06/23/2021 12:30 PM Medical Record Number: [id number] Patient Account Number: [id number] Date of Birth/Sex: Treating RN: [birth year] (60 y.o. Charlean Merl, Lauren Primary Care Kamla Skilton: [name] Other Clinician: Referring Adesuwa Osgood: Treating Champagne Paletta/Extender: Avie Echevaria in Treatment: 2 Vital Signs Height(in): 73 Pulse(bpm): 74 Weight(lbs): 270 Blood  Pressure(mmHg): 124/83 Body Mass Index(BMI): 36 Temperature(F): 97.7 Respiratory Rate(breaths/min): 17 Photos: [3:Left, Plantar Foot] [N/A:N/A N/A] Wound Location: [3:Gradually Appeared] [N/A:N/A] Wounding Event: [3:Diabetic Wound/Ulcer of the Lower] [N/A:N/A] Primary Etiology: [3:Extremity Anemia, Sleep Apnea, Congestive] [N/A:N/A] Comorbid History: [3:Heart Failure, Hypertension, Peripheral Venous Disease, Cirrhosis , Type II Diabetes, Osteoarthritis, Neuropathy 03/18/2021] [N/A:N/A] Date Acquired: [3:2] [N/A:N/A] Weeks of Treatment: [3:Open] [N/A:N/A] Wound Status: [3:2.2x2.9x0.1] [N/A:N/A] Measurements L x W x D (cm) [3:5.011] [N/A:N/A] A (cm) : rea [3:0.501] [N/A:N/A] Volume (cm) : [3:29.10%] [N/A:N/A] % Reduction in A [3:rea: 29.10%] [N/A:N/A] % Reduction in Volume: [3:Grade 3] [N/A:N/A] Classification: [3:Medium] [N/A:N/A] Exudate A mount: [  3:Serosanguineous] [N/A:N/A] Exudate Type: [3:red, brown] [N/A:N/A] Exudate Color: [3:Flat and Intact] [N/A:N/A] Wound Margin: [3:Large (67-100%)] [N/A:N/A] Granulation A mount: [3:Red] [N/A:N/A] Granulation Quality: [3:None Present (0%)] [N/A:N/A] Necrotic A mount: [3:Fat Layer (Subcutaneous Tissue): Yes N/A] Exposed Structures: [3:Fascia: No Tendon: No Muscle: No Joint: No Bone: No None] [N/A:N/A] Epithelialization: [3:Debridement - Excisional] [N/A:N/A] Debridement: Pre-procedure Verification/Time Out 12:51 [N/A:N/A] Taken: [3:Lidocaine] [N/A:N/A] Pain Control: [3:Callus, Subcutaneous, Slough] [N/A:N/A] Tissue Debrided: [3:Skin/Subcutaneous Tissue] [N/A:N/A] Level: [3:6.38] [N/A:N/A] Debridement A (sq cm): [3:rea Curette] [N/A:N/A] Instrument: [3:Minimum] [N/A:N/A] Bleeding: [3:Pressure] [N/A:N/A] Hemostasis A chieved: [3:0] [N/A:N/A] Procedural Pain: [3:0] [N/A:N/A] Post Procedural Pain: [3:Procedure was tolerated well] [N/A:N/A] Debridement Treatment Response: [3:2.2x2.9x0.1] [N/A:N/A] Post Debridement Measurements L x W x  D (cm) [3:0.501] [N/A:N/A] Post Debridement Volume: (cm) [3:Debridement] [N/A:N/A] Treatment Notes Electronic Signature(s) Signed: 06/23/2021 5:13:37 PM By: Baltazar Najjar MD Signed: 06/23/2021 5:22:11 PM By: Fonnie Mu RN Entered By: Baltazar Najjar on 06/23/2021 13:13:59 -------------------------------------------------------------------------------- Multi-Disciplinary Care Plan Details Patient Name: Date of Service: NAZIER, NEYHART [name] 06/23/2021 12:30 PM Medical Record Number: [id number] Patient Account Number: [id number] Date of Birth/Sex: Treating RN: 1960/12/13 (60 y.o. Charlean Merl, Lauren Primary Care Rameses Ou: [name] Other Clinician: Referring Sekai Nayak: Treating Deante Blough/Extender: Avie Echevaria in Treatment: 2 Multidisciplinary Care Plan reviewed with physician Active Inactive HBO Nursing Diagnoses: Anxiety related to feelings of confinement associated with the hyperbaric oxygen chamber Anxiety related to knowledge deficit of hyperbaric oxygen therapy and treatment procedures Discomfort related to temperature and humidity changes inside hyperbaric chamber Potential for barotraumas to ears, sinuses, teeth, and lungs or cerebral gas embolism related to changes in atmospheric pressure inside hyperbaric oxygen chamber Potential for oxygen toxicity seizures related to delivery of 100% oxygen at an increased atmospheric pressure Potential for pulmonary oxygen toxicity related to delivery of 100% oxygen at an increased atmospheric pressure Goals: Barotrauma will be prevented during HBO2 Date Initiated: 06/04/2021 T arget Resolution Date: 08/14/2021 Goal Status: Active Patient and/or family will be able to state/discuss factors appropriate to the management of their disease process during treatment Date Initiated: 06/04/2021 T arget Resolution Date: 08/14/2021 Goal Status: Active Patient will tolerate the hyperbaric oxygen therapy treatment Date  Initiated: 06/04/2021 T arget Resolution Date: 08/14/2021 Goal Status: Active Patient will tolerate the internal climate of the chamber Date Initiated: 06/04/2021 T arget Resolution Date: 08/14/2021 Goal Status: Active Patient/caregiver will verbalize understanding of HBO goals, rationale, procedures and potential hazards Date Initiated: 06/04/2021 T arget Resolution Date: 08/14/2021 Goal Status: Active Signs and symptoms of pulmonary oxygen toxicity will be recognized and promptly addressed Date Initiated: 06/04/2021 T arget Resolution Date: 08/14/2021 Goal Status: Active Signs and symptoms of seizure will be recognized and promptly addressed ; seizing patients will suffer no harm Date Initiated: 06/04/2021 T arget Resolution Date: 08/14/2021 Goal Status: Active Interventions: Administer a five (5) minute air break for patient if signs and symptoms of seizure appear and notify the hyperbaric physician Administer decongestants, per physician orders, prior to HBO2 Administer the correct therapeutic gas delivery based on the patients needs and limitations, per physician order Assess and provide for patients comfort related to the hyperbaric environment and equalization of middle ear Assess for signs and symptoms related to adverse events, including but not limited to confinement anxiety, pneumothorax, oxygen toxicity and baurotrauma Assess patient for any history of confinement anxiety Assess patient's knowledge and expectations regarding hyperbaric medicine and provide education related to the hyperbaric environment, goals of treatment and prevention of adverse events Implement protocols to decrease risk of pneumothorax  in high risk patients Notes: Abuse / Safety / Falls / Self Care Management Nursing Diagnoses: Potential for falls Potential for injury related to falls Goals: Patient will not experience any injury related to falls Date Initiated: 06/04/2021 Target Resolution Date:  07/04/2021 Goal Status: Active Patient/caregiver will verbalize/demonstrate measures taken to prevent injury and/or falls Date Initiated: 06/04/2021 Target Resolution Date: 07/04/2021 Goal Status: Active Interventions: Assess Activities of Daily Living upon admission and as needed Assess fall risk on admission and as needed Assess: immobility, friction, shearing, incontinence upon admission and as needed Assess impairment of mobility on admission and as needed per policy Assess personal safety and home safety (as indicated) on admission and as needed Provide education on fall prevention Provide education on personal and home safety Notes: Nutrition Nursing Diagnoses: Impaired glucose control: actual or potential Potential for alteratiion in Nutrition/Potential for imbalanced nutrition Goals: Patient/caregiver agrees to and verbalizes understanding of need to use nutritional supplements and/or vitamins as prescribed Date Initiated: 06/04/2021 Target Resolution Date: 07/04/2021 Goal Status: Active Patient/caregiver will maintain therapeutic glucose control Date Initiated: 06/04/2021 Target Resolution Date: 07/04/2021 Goal Status: Active Interventions: Assess HgA1c results as ordered upon admission and as needed Assess patient nutrition upon admission and as needed per policy Provide education on elevated blood sugars and impact on wound healing Notes: Wound/Skin Impairment Nursing Diagnoses: Impaired tissue integrity Knowledge deficit related to ulceration/compromised skin integrity Goals: Patient/caregiver will verbalize understanding of skin care regimen Date Initiated: 06/04/2021 Target Resolution Date: 07/04/2021 Goal Status: Active Interventions: Assess patient/caregiver ability to obtain necessary supplies Assess patient/caregiver ability to perform ulcer/skin care regimen upon admission and as needed Assess ulceration(s) every visit Provide education on ulcer and skin  care Notes: Electronic Signature(s) Signed: 06/23/2021 5:22:11 PM By: Fonnie MuBreedlove, Lauren RN Entered By: Fonnie MuBreedlove, Lauren on 06/23/2021 12:45:03 -------------------------------------------------------------------------------- Pain Assessment Details Patient Name: Date of Service: [name] OxfordMILLER, [name] 06/23/2021 12:30 PM Medical Record Number: [id number] Patient Account Number: [id number] Date of Birth/Sex: Treating RN: 05/22/1961 (60 y.o. [name] GrovesM) Breedlove, Lauren Primary Care Tevyn Codd: Cheryll CockayneBurns, Stacy Other Clinician: Referring Merle Cirelli: Treating Chee Kinslow/Extender: Avie Echevariaobson, Michael Burns, Stacy Weeks in Treatment: 2 Active Problems Location of Pain Severity and Description of Pain Patient Has Paino Yes Site Locations Pain Location: Pain Location: Generalized Pain, Pain in Ulcers With Dressing Change: Yes Duration of the Pain. Constant / Intermittento Intermittent Rate the pain. Current Pain Level: 5 Worst Pain Level: 10 Least Pain Level: 0 Tolerable Pain Level: 5 Character of Pain Describe the Pain: Aching Pain Management and Medication Current Pain Management: Medication: No Cold Application: No Rest: No Massage: No Activity: No T.E.N.S.: No Heat Application: No Leg drop or elevation: No Is the Current Pain Management Adequate: Adequate How does your wound impact your activities of daily livingo Sleep: No Bathing: No Appetite: No Relationship With Others: No Bladder Continence: No Emotions: No Bowel Continence: No Work: No Toileting: No Drive: No Dressing: No Hobbies: No Electronic Signature(s) Signed: 06/23/2021 5:22:11 PM By: Fonnie MuBreedlove, Lauren RN Entered By: Fonnie MuBreedlove, Lauren on 06/23/2021 12:42:15 -------------------------------------------------------------------------------- Patient/Caregiver Education Details Patient Name: Date of Service: [name] KindlerMILLER, [name] 9/6/2022andnbsp12:30 PM Medical Record Number: [id number] Patient Account Number: [id number] Date of  Birth/Gender: Treating RN: 05/12/1961 (60 y.o. [name] GrovesM) Breedlove, Lauren Primary Care Physician: Cheryll CockayneBurns, Stacy Other Clinician: Referring Physician: Treating Physician/Extender: Avie Echevariaobson, Michael Burns, Stacy Weeks in Treatment: 2 Education Assessment Education Provided To: Patient Education Topics Provided Elevated Blood Sugar/ Impact on Healing: Methods: Explain/Verbal Responses: Reinforcements needed Safety: Methods: Explain/Verbal Responses: Reinforcements needed Wound/Skin Impairment: Methods: Explain/Verbal Responses: Reinforcements  needed Electronic Signature(s) Signed: 06/23/2021 5:22:11 PM By: Fonnie Mu RN Entered By: Fonnie Mu on 06/23/2021 12:45:21 -------------------------------------------------------------------------------- Wound Assessment Details Patient Name: Date of Service: DAKARAI, MCGLOCKLIN [name] 06/23/2021 12:30 PM Medical Record Number: [id number] Patient Account Number: [id number] Date of Birth/Sex: Treating RN: 1960-12-10 (60 y.o. Charlean Merl, Lauren Primary Care Irini Leet: [name] Other Clinician: Referring Lexandra Rettke: Treating Ammar Moffatt/Extender: Avie Echevaria in Treatment: 2 Wound Status Wound Number: 3 Primary Diabetic Wound/Ulcer of the Lower Extremity Etiology: Wound Location: Left, Plantar Foot Wound Open Wounding Event: Gradually Appeared Status: Date Acquired: 03/18/2021 Comorbid Anemia, Sleep Apnea, Congestive Heart Failure, Hypertension, Weeks Of Treatment: 2 History: Peripheral Venous Disease, Cirrhosis , Type II Diabetes, Clustered Wound: No Osteoarthritis, Neuropathy Photos Wound Measurements Length: (cm) 2.2 Width: (cm) 2.9 Depth: (cm) 0.1 Area: (cm) 5.011 Volume: (cm) 0.501 % Reduction in Area: 29.1% % Reduction in Volume: 29.1% Epithelialization: None Tunneling: No Undermining: No Wound Description Classification: Grade 3 Wound Margin: Flat and Intact Exudate Amount: Medium Exudate Type:  Serosanguineous Exudate Color: red, brown Foul Odor After Cleansing: No Slough/Fibrino No Wound Bed Granulation Amount: Large (67-100%) Exposed Structure Granulation Quality: Red Fascia Exposed: No Necrotic Amount: None Present (0%) Fat Layer (Subcutaneous Tissue) Exposed: Yes Tendon Exposed: No Muscle Exposed: No Joint Exposed: No Bone Exposed: No Treatment Notes Wound #3 (Foot) Wound Laterality: Plantar, Left Cleanser Soap and Water Discharge Instruction: May shower and wash wound with dial antibacterial soap and water prior to dressing change. Wound Cleanser Discharge Instruction: Cleanse the wound with wound cleanser prior to applying a clean dressing using gauze sponges, not tissue or cotton balls. Peri-Wound Care Topical Primary Dressing MediHoney Calcium Alginate Dressing 4x5 in Discharge Instruction: Apply to wound bed as instructed Secondary Dressing Woven Gauze Sponge, Non-Sterile 4x4 in Discharge Instruction: Apply over primary dressing as directed. ABD Pad, 5x9 Discharge Instruction: Apply over primary dressing as directed. Secured With [employer], 4.5x3.1 (in/yd) Discharge Instruction: Secure with Kerlix as directed. 78M Medipore H Soft Cloth Surgical T ape, 2x2 (in/yd) Discharge Instruction: Secure dressing with tape as directed. Compression Wrap Compression Stockings Add-Ons Electronic Signature(s) Signed: 06/23/2021 5:22:11 PM By: Fonnie Mu RN Signed: 06/25/2021 11:10:38 AM By: Karl Ito Entered By: Karl Ito on 06/23/2021 12:45:02 -------------------------------------------------------------------------------- Vitals Details Patient Name: Date of Service: JEREMIYAH, CULLENS [name] 06/23/2021 12:30 PM Medical Record Number: [id number] Patient Account Number: [id number] Date of Birth/Sex: Treating RN: January 05, 1961 (60 y.o. Charlean Merl, Lauren Primary Care Teyton Pattillo: [name] Other Clinician: Referring Adlai Nieblas: Treating  Napoleon Monacelli/Extender: Avie Echevaria in Treatment: 2 Vital Signs Time Taken: 12:41 Temperature (F): 97.7 Height (in): 73 Pulse (bpm): 74 Weight (lbs): 270 Respiratory Rate (breaths/min): 17 Body Mass Index (BMI): 35.6 Blood Pressure (mmHg): 124/83 Reference Range: 80 - 120 mg / dl Electronic Signature(s) Signed: 06/23/2021 5:22:11 PM By: Fonnie Mu RN Entered By: Fonnie Mu on 06/23/2021 12:41:53

## 2021-06-25 NOTE — Chronic Care Management (AMB) (Signed)
Chronic Care Management   CCM RN Visit Note  06/25/2021 Name: [name] MRN: [id number] DOB: [birth year]  Subjective: [name] is a 60 y.o. year old male who is a primary care patient of Burns, Bobette Mo, MD. The care management team was consulted for assistance with disease management and care coordination needs.    Engaged with patient by telephone for initial visit in response to provider referral for case management and/or care coordination services.   Consent to Services:  The patient was given information about Chronic Care Management services, agreed to services, and gave verbal consent 06/17/21 prior to initiation of services.  Please see initial visit note for detailed documentation.  Patient agreed to services and verbal consent obtained.   Assessment: Review of patient past medical history, allergies, medications, health status, including review of consultants reports, laboratory and other test data, was performed as part of comprehensive evaluation and provision of chronic care management services.   SDOH (Social Determinants of Health) assessments and interventions performed:  SDOH Interventions    Flowsheet Row Most Recent Value  SDOH Interventions   Food Insecurity Interventions Intervention Not Indicated  Housing Interventions Intervention Not Indicated  Transportation Interventions Intervention Not Indicated  [patient's girlfriend [name] provides transportation while he is recuperating,  plans to resume driving self in future]       CCM Care Plan Allergies  Allergen Reactions   Claritin [Loratadine] Shortness Of Breath and Anxiety   Outpatient Encounter Medications as of 06/24/2021  Medication Sig Note   amoxicillin-clavulanate (AUGMENTIN) 875-125 MG tablet Take 1 tablet by mouth 2 (two) times daily.    doxycycline (VIBRA-TABS) 100 MG tablet Take 1 tablet (100 mg total) by mouth 2 (two) times daily.    acamprosate (CAMPRAL) 333 MG tablet Take 333 mg by mouth 2  (two) times daily.    docusate sodium (COLACE) 100 MG capsule TAKE 1 CAPSULE (100 MG TOTAL) BY MOUTH DAILY AS NEEDED.    DULoxetine (CYMBALTA) 20 MG capsule Take 1 capsule (20 mg total) by mouth daily.    furosemide (LASIX) 40 MG tablet TAKE 1 TABLET BY MOUTH TWICE A DAY    gabapentin (NEURONTIN) 100 MG capsule Take 1 capsule (100 mg total) by mouth 3 (three) times daily.    HEMP OIL-VANILLYL BUTYL ETHER EX Apply 1 application topically 2 (two) times daily as needed (arthritis pain).    HYDROcodone-acetaminophen (NORCO/VICODIN) 5-325 MG tablet Take 1 tablet by mouth every 8 (eight) hours as needed for moderate pain.    hydrOXYzine (ATARAX/VISTARIL) 25 MG tablet Take 25 mg by mouth every 6 (six) hours as needed for anxiety.    levothyroxine (SYNTHROID) 50 MCG tablet Take 50 mcg by mouth daily before breakfast.    metoprolol tartrate (LOPRESSOR) 50 MG tablet Take 1 tablet (50 mg total) by mouth 2 (two) times daily.    naltrexone (DEPADE) 50 MG tablet Take 50 mg by mouth daily. 04/27/2021: Ran out   naproxen sodium (ALEVE) 220 MG tablet Take 220 mg by mouth every 12 (twelve) hours.    ondansetron (ZOFRAN) 4 MG tablet Take 4 mg by mouth every 8 (eight) hours as needed for nausea or vomiting.    potassium chloride SA (KLOR-CON M20) 20 MEQ tablet Take 3 tablets (60 mEq total) by mouth 2 (two) times daily.    traZODone (DESYREL) 50 MG tablet TAKE 1-2 TABLETS (50-100 MG TOTAL) BY MOUTH AT BEDTIME AS NEEDED FOR SLEEP.    vitamin B-12 (CYANOCOBALAMIN) 100 MCG tablet Take 100 mcg  by mouth daily.    No facility-administered encounter medications on file as of 06/24/2021.   Patient Active Problem List   Diagnosis Date Noted   Critical lower limb ischemia (HCC) 05/29/2021   Alcohol abuse 04/28/2021   Diabetic foot ulcer (HCC) 04/28/2021   Severe sepsis (HCC) 04/28/2021   Osteomyelitis (HCC) 04/27/2021   Anxiety 03/17/2021   Amputated toe of right foot (HCC) 03/03/2020   Alcoholic peripheral neuropathy  (HCC) 10/28/2019   Chronic diastolic CHF (congestive heart failure) (HCC) 10/15/2019   Chronic foot ulcer (HCC) 10/15/2019   Alcoholic cirrhosis of liver without ascites (HCC) 04/27/2019   Obesity hypoventilation syndrome (HCC) 04/27/2019   Depression 04/25/2019   Severe alcohol use disorder (HCC)    DOE (dyspnea on exertion) 04/22/2019   Hypothyroidism 04/22/2019   Anemia 02/06/2019   Acute on chronic diastolic CHF (congestive heart failure) (HCC) 01/30/2019   Avascular necrosis of hip, left (HCC) 11/02/2018   Decreased hearing of both ears 10/30/2018   Avascular necrosis of hip, right (HCC) 08/16/2018   Bilateral leg edema 02/27/2018   Marijuana abuse 02/16/2018   Ulcer of left heel (HCC) 12/21/2016   Bilateral carpal tunnel syndrome 12/15/2016   Hyperlipidemia 11/25/2016   Prediabetes 11/25/2016   Hypertension 11/23/2016   History of osteomyelitis 11/23/2016   Morbid obesity (HCC) 11/23/2016   OSA (obstructive sleep apnea) 11/23/2016   Other hammer toe (acquired) 11/11/2013   Exstrophy of bladder 11/11/2013   Conditions to be addressed/monitored:  CHF and DMII  Care Plan : RN Care Manager PLan of Care  Updates made by Michaela Corner, RN since 06/25/2021 12:00 AM     Problem: Chronic Disease Management Needs   Priority: Medium     Long-Range Goal: Development of plan of care for long term chronic disease management   Start Date: 06/24/2021  Expected End Date: 06/24/2022  Priority: Medium  Note:   Current Barriers:  Chronic Disease Management support and education needs related to CHF, DMII, and pre- DM  RNCM Clinical Goal(s):  Patient will demonstrate ongoing health management independence CHF/ pre-DMII  through collaboration with RN Care manager, provider, and care team.   Interventions: 1:1 collaboration with primary care provider regarding development and update of comprehensive plan of care as evidenced by provider attestation and co-signature Inter-disciplinary  care team collaboration (see longitudinal plan of care) Evaluation of current treatment plan related to  self management and patient's adherence to plan as established by provider  Heart Failure Interventions:  (Status: New goal.) Basic overview and discussion of pathophysiology of Heart Failure reviewed; Reviewed role of diuretics in prevention of fluid overload and management of heart failure; Discussed the importance of keeping all appointments with provider; Screening for signs and symptoms of depression related to chronic disease state;  Assessed social determinant of health barriers;  Reviewed with patient recent hospital/ SNF/ rehabilitation visits Confirmed home health team [facility]) active: patient had nurse visit today for dressing of (L) foot; home health OT expected later today; PT also currently active Confirmed patient has no medication concerns: manages medications independently, uses pill box; continues to take oral antibiotics prescribed at time of PICC line/ IV antibiotics discontinuation Confirmed patient attended yesterday's wound clinic visit- reports he believes wound "healing well;" declines need for medication review today: home health nurse just left; OT expected at any time Discussed mobility after recent toe amputation: patient making progress, denies new/ recent falls; using assistive devices (walker, crutches, wheelchair) as needed; also has knee scooter Confirmed patient has  not been drinking ETOH: reports 56 days without alcohol- positive reinforcement/ encouragement to continue provided Assessed patient's baseline knowledge regarding CHF self-health management: patient reports a good general understanding of same; states he is unable to weight currently as his (L) foot wound recuperates; verbalizes plans to resume once wound healed; rationale for daily weights at home in setting of CHF discussed; patient reports they "pulled over 50 lbs of fluid off" while he was  hospitalized  Diabetes:  (Status: New goal.) Lab Results  Component Value Date   HGBA1C 6.6 (H) 04/27/2021  Assessed patient's understanding of diabetes/ A1-C goal: <6.5% Patient reports he has never been diagnosed formally with DM: reports "pre-DM;" confirms not on medication for DM Discussed plans with patient for ongoing care management follow up and provided patient with direct contact information for care management team;      Reviewed scheduled/upcoming provider appointments including: 06/30/21- wound clinic; 07/03/21- nurse visit/ foot wound; 07/15/21- ID provider; 09/04/21- cardiology provider;         Review of patient status, including review of consultants reports, relevant laboratory and other test results, and medications completed;        Patient Goals/Self-Care Activities: Patient will self administer medications as prescribed Patient will attend all scheduled provider appointments Patient will call pharmacy for medication refills Patient will call provider office for new concerns or questions Patient will continue to work with home health team Patient will continue to use assistive devices as indicated to prevent falls at home as his (L) foot wound heals Patient will continue to not consume alcohol      Plan: Telephone follow up appointment with care management team member scheduled for:  Wednesday, July 08, 2021 at 2:00 pm The patient has been provided with contact information for the care management team and has been advised to call with any health related questions or concerns  Caryl Pina, RN, BSN, CCRN [facility] RN Care Coordination- [facility] [place] [phone]: direct office [phone]: mobile

## 2021-06-25 NOTE — Telephone Encounter (Signed)
Spoke with Sharyl Nimrod and she has advised that PT went out for evaluation but she is not sure if they were able to accept him or not.

## 2021-06-25 NOTE — Patient Instructions (Signed)
Visit Information   [name], it was nice talking with you today.   I look forward to talking to you again for an update on Wednesday, July 08, 2021 at 2:00 pm- please be listening out for my call that day.  I will call as close to 2:00 pm as possible.   If you need to cancel or re-schedule our telephone visit, please call [phone] and one of our care guides will be happy to assist you.   I look forward to hearing about your progress.   Please don't hesitate to contact me if I can be of assistance to you before our next scheduled telephone appointment.   [name] Mckinney Tousey, RN, BSN, CCRN [facility] RN Care Coordination- [facility] [phone]: direct office [phone]: mobile   PATIENT GOALS:   Goals Addressed             This Visit's Progress    Patient Self-Care Activities   On track    Timeframe:  Long-Range Goal Priority:  Medium Start Date:         06/24/21                    Expected End Date:     06/24/22                  Patient will self administer medications as prescribed Patient will attend all scheduled provider appointments Patient will call pharmacy for medication refills Patient will call provider office for new concerns or questions Patient will continue to work with home health team Patient will continue to use assistive devices as indicated to prevent falls at home as his (L) foot wound heals Patient will continue to not consume alcohol       Toe Amputation, Care After This sheet gives you information about how to care for yourself after your procedure. Your health care provider may also give you more specific instructions. If you have problems or questions, contact your health care provider. What can I expect after the procedure? After the procedure, it is common to have some pain. Pain usually improves within a week. Follow these instructions at home: Medicines Take over-the-counter and prescription medicines only as  told by your health care provider. If you were prescribed an antibiotic medicine, take it as told by your health care provider. Do not stop taking the antibiotic even if you start to feel better. Ask your health care provider if the medicine prescribed to you: Requires you to avoid driving or using heavy machinery. Can cause constipation. You may need to take actions to prevent or treat constipation, such as: Drink enough fluid to keep your urine pale yellow. Take over-the-counter or prescription medicines. Eat foods that are high in fiber, such as beans, whole grains, and fresh fruits and vegetables. Limit foods that are high in fat and processed sugars, such as fried or sweet foods. Managing pain, stiffness, and swelling  If directed, put ice on the painful area. Put ice in a plastic bag. Place a towel between your skin and the bag. Leave the ice on for 20 minutes, 2-3 times a day. Move your other toes often to reduce stiffness and swelling. Raise (elevate) your foot above the level of your heart while you are sitting or lying down. Incision care   Follow instructions from your health care provider about how to take care of your incision. Make sure you: Wash your hands with soap and water before and   after you change your bandage (dressing). If soap and water are not available, use hand sanitizer. Change your dressing as told by your health care provider. Leave stitches (sutures), skin glue, or adhesive strips in place. These skin closures may need to stay in place for 2 weeks or longer. If adhesive strip edges start to loosen and curl up, you may trim the loose edges. Do not remove adhesive strips completely unless your health care provider tells you to do that. Check your incision area every day for signs of infection. Check for: More redness, swelling, or pain. Fluid or blood. Warmth. Pus or a bad smell. Bathing Do not take baths, swim, use a hot tub, or soak your foot until your  health care provider approves. Ask your health care provider if you may take showers. You may only be allowed to take sponge baths. If your dressing has been removed, you may wash your skin with warm water and soap. Activity Rest as told by your health care provider. Avoid sitting for a long time without moving. Get up to take short walks every 1-2 hours. This is important to improve blood flow and breathing. Ask for help if you feel weak or unsteady. If you have been given crutches, use them as told by your health care provider. Do exercises as told by your health care provider. Return to your normal activities as told by your health care provider. Ask your health care provider what activities are safe for you. General instructions Do not drive for 24 hours if you were given a sedative during your procedure. Do not use any products that contain nicotine or tobacco, such as cigarettes, e-cigarettes, and chewing tobacco. If you need help quitting, ask your health care provider. Ask your health care provider about wearing supportive shoes or using inserts to help with your balance when walking. If you have diabetes, keep your blood sugar under good control and check your feet daily for sores or open areas. Keep all follow-up visits as told by your health care provider. This is important. Contact a health care provider if: You have signs of infection at your incision, such as: Redness, swelling, or pain. Fluid or blood. Warmth. Pus or a bad smell. You have a fever or chills. Your dressing is soaked with blood. Your sutures tear or they separate. You have numbness or tingling in your toes or foot. Your foot is cool or pale, or it changes color. Your pain does not improve after you take your medicine. Get help right away if you have: Pain or swelling near your incision that gets worse or does not go away. Red streaks on your skin near your toes, foot, or leg. Pain in your calf or behind your  knee. Shortness of breath. Chest pain. Summary After the procedure, it is common to have some pain. Pain usually improves within a week. If you were prescribed an antibiotic medicine, take it as told by your health care provider. Do not stop taking the antibiotic even if you start to feel better. Change your dressing as told by your health care provider. Contact a health care provider if you have signs of infection. Keep all follow-up visits as told by your health care provider. This is important. This information is not intended to replace advice given to you by your health care provider. Make sure you discuss any questions you have with your health care provider. Document Revised: 01/24/2019 Document Reviewed: 09/26/2018 Elsevier Patient Education  2022 [employer]    Consent to CCM Services: [name] was given information about Chronic Care Management services including:  CCM service includes personalized support from designated clinical staff supervised by his physician, including individualized plan of care and coordination with other care providers 24/7 contact phone numbers for assistance for urgent and routine care needs. Service will only be billed when office clinical staff spend 20 minutes or more in a month to coordinate care. Only one practitioner may furnish and bill the service in a calendar month. The patient may stop CCM services at any time (effective at the end of the month) by phone call to the office staff. The patient will be responsible for cost sharing (co-pay) of up to 20% of the service fee (after annual deductible is met).  Patient agreed to services and verbal consent obtained.   Patient verbalizes understanding of instructions provided today and agrees to view in MyChart Telephone follow up appointment with care management team member scheduled for:  Wednesday, July 08, 2021 at 2:00 pm The patient has been provided with contact information for the care  management team and has been advised to call with any health related questions or concerns  [name] Rack, RN, BSN, [place] [phone]: direct office [phone]: mobile    CLINICAL CARE PLAN: Patient Care Plan: RN Care Manager PLan of Care     Problem Identified: Chronic Disease Management Needs   Priority: Medium     Long-Range Goal: Development of plan of care for long term chronic disease management   Start Date: 06/24/2021  Expected End Date: 06/24/2022  Priority: Medium  Note:   Current Barriers:  Chronic Disease Management support and education needs related to CHF, DMII, and pre- DM  RNCM Clinical Goal(s):  Patient will demonstrate ongoing health management independence CHF/ pre-DMII  through collaboration with RN Care manager, provider, and care team.   Interventions: 1:1 collaboration with primary care provider regarding development and update of comprehensive plan of care as evidenced by provider attestation and co-signature Inter-disciplinary care team collaboration (see longitudinal plan of care) Evaluation of current treatment plan related to  self management and patient's adherence to plan as established by provider  Heart Failure Interventions:  (Status: New goal.) Basic overview and discussion of pathophysiology of Heart Failure reviewed; Reviewed role of diuretics in prevention of fluid overload and management of heart failure; Discussed the importance of keeping all appointments with provider; Screening for signs and symptoms of depression related to chronic disease state;  Assessed social determinant of health barriers;  Reviewed with patient recent hospital/ SNF/ rehabilitation visits Confirmed home health team [facility]) active: patient had nurse visit today for dressing of (L) foot; home health OT expected later today; PT also currently active Confirmed patient has no medication  concerns: manages medications independently, uses pill box; continues to take oral antibiotics prescribed at time of PICC line/ IV antibiotics discontinuation Confirmed patient attended yesterday's wound clinic visit- reports he believes wound "healing well;" declines need for medication review today: home health nurse just left; OT expected at any time Discussed mobility after recent toe amputation: patient making progress, denies new/ recent falls; using assistive devices (walker, crutches, wheelchair) as needed; also has knee scooter Confirmed patient has not been drinking ETOH: reports 56 days without alcohol- positive reinforcement/ encouragement to continue provided Assessed patient's baseline knowledge regarding CHF self-health management: patient reports a good general understanding of same; states he is unable to weight currently as his (  L) foot wound recuperates; verbalizes plans to resume once wound healed; rationale for daily weights at home in setting of CHF discussed; patient reports they "pulled over 50 lbs of fluid off" while he was hospitalized  Diabetes:  (Status: New goal.) Lab Results  Component Value Date   HGBA1C 6.6 (H) 04/27/2021  Assessed patient's understanding of diabetes/ A1-C goal: <6.5% Patient reports he has never been diagnosed formally with DM: reports "pre-DM;" confirms not on medication for DM Discussed plans with patient for ongoing care management follow up and provided patient with direct contact information for care management team;      Reviewed scheduled/upcoming provider appointments including: 06/30/21- wound clinic; 07/03/21- nurse visit/ foot wound; 07/15/21- ID provider; 09/04/21- cardiology provider;         Review of patient status, including review of consultants reports, relevant laboratory and other test results, and medications completed;        Patient Goals/Self-Care Activities: Patient will self administer medications as prescribed Patient will  attend all scheduled provider appointments Patient will call pharmacy for medication refills Patient will call provider office for new concerns or questions Patient will continue to work with home health team Patient will continue to use assistive devices as indicated to prevent falls at home as his (L) foot wound heals Patient will continue to not consume alcohol

## 2021-06-30 ENCOUNTER — Encounter (HOSPITAL_BASED_OUTPATIENT_CLINIC_OR_DEPARTMENT_OTHER): Payer: Medicare Other | Admitting: Internal Medicine

## 2021-06-30 ENCOUNTER — Other Ambulatory Visit: Payer: Self-pay

## 2021-06-30 DIAGNOSIS — E11621 Type 2 diabetes mellitus with foot ulcer: Secondary | ICD-10-CM | POA: Diagnosis not present

## 2021-07-01 ENCOUNTER — Encounter (HOSPITAL_BASED_OUTPATIENT_CLINIC_OR_DEPARTMENT_OTHER): Payer: Medicare Other | Admitting: Internal Medicine

## 2021-07-01 NOTE — Progress Notes (Signed)
[name], [name] ([id number]) Visit Report for 06/30/2021 Debridement Details Patient Name: Date of Service: [name] 06/30/2021 8:45 A M Medical Record Number: [id number] Patient Account Number: [id number] Date of Birth/Sex: Treating RN: [birth year] (60 y.o. Charlean Merl, Lauren Primary Care Provider: [name] Other Clinician: Referring Provider: Treating Provider/Extender: Avie Echevaria in Treatment: 3 Debridement Performed for Assessment: Wound #3 Left,Plantar Foot Performed By: Physician Maxwell Caul., MD Debridement Type: Debridement Severity of Tissue Pre Debridement: Fat layer exposed Level of Consciousness (Pre-procedure): Awake and Alert Pre-procedure Verification/Time Out Yes - 09:17 Taken: Start Time: 09:18 Pain Control: Other : benzocaine 20% T Area Debrided (L x W): otal 1.9 (cm) x 2.9 (cm) = 5.51 (cm) Tissue and other material debrided: Non-Viable, Callus, Subcutaneous Level: Skin/Subcutaneous Tissue Debridement Description: Excisional Instrument: Curette Bleeding: Minimum Hemostasis Achieved: Pressure End Time: 09:22 Response to Treatment: Procedure was tolerated well Level of Consciousness (Post- Awake and Alert procedure): Post Debridement Measurements of Total Wound Length: (cm) 1.9 Width: (cm) 2.9 Depth: (cm) 0.3 Volume: (cm) 1.298 Character of Wound/Ulcer Post Debridement: Stable Severity of Tissue Post Debridement: Fat layer exposed Post Procedure Diagnosis Same as Pre-procedure Electronic Signature(s) Signed: 06/30/2021 5:15:03 PM By: Fonnie Mu RN Signed: 07/01/2021 3:40:47 PM By: Baltazar Najjar MD Entered By: Baltazar Najjar on 06/30/2021 09:34:51 -------------------------------------------------------------------------------- HPI Details Patient Name: Date of Service: [name] 06/30/2021 8:45 A M Medical Record Number: [id number] Patient Account Number: [id number] Date of Birth/Sex: Treating  RN: 20-Apr-1961 (60 y.o. [name] Primary Care Provider: [name] Other Clinician: Referring Provider: Treating Provider/Extender: Avie Echevaria in Treatment: 3 History of Present Illness HPI Description: 10/31/2019 upon evaluation today patient appears to be doing somewhat poorly in regard to his bilateral plantar feet. He has wounds that he tells me have been present since 2012 intermittently off and on. Most recently this has been open for at least the past 6 months to a year. He has been trying to treat this in different ways using Santyl along with various other dressings including Medihoney and even at one point Xeroform. Nothing really has seem to get this completely closed. He was recently in the hospital for cellulitis of his leg subsequently he did have x-rays as well as MRIs that showed negative for any signs of osteomyelitis in regard to the wounds on his feet. Fortunately there is no signs of systemic infection at this time. No fevers, chills, nausea, vomiting, or diarrhea. Patient has previously used Darco offloading shoes as far as frontal floaters as well as postop surgical shoes. He has never been in a total contact cast that may be something we need to strongly consider here. Patient's most recent hemoglobin A1c 1 month ago was 5.3 seems to be very well controlled which is great. Subsequently he has seen vascular as well as podiatry. His ABIs are 1.07 on the left and 1.14 on the right he seems to be doing well he does have chronic venous stasis. 11/07/2019 upon evaluation today patient appears to be doing well with regard to his wounds all things considered. I do not see any severe worsening he still has some callus buildup on the right more than the left he notes that he has been probably more active than he should as far as walking is concerned is just very hard to not be active. He knows he needs to be more careful in this regard however. He is  willing to give the cast a try at this  point although he notes that he is a little nervous about this just with regard to balance although he will be very careful and obviously if he has any trouble he knows to contact the office and let me know. 1/22; patient is in for his obligatory first total contact cast change. Our intake nurse reported a very large amount of drainage which is spelled out over to the surrounding skin. Has bilateral diabetic foot wounds. He has Charcot feet. We have been using silver alginate on his wounds. 11/14/2019 on evaluation today patient is actually seeming to make good improvement in regard to his bilateral plantar foot wounds. We have been using a cast on the left side and on the right side he has been using dressings he is changing up his own accord. With that being said he tells me that he is also not walking as much just due to how unsteady he feels. He takes it easy when he does have to walk and when he does not have to walk he is resting. This is probably help in his right foot as well has the left foot which is actually measuring better. In fact both are measuring better. Overall I am very pleased with how things seem to be progressing. The patient does have some odor on the left foot this does have me concerned about the possibility of infection, and actually probably go ahead and put him on antibiotics today as well as utilizing a continuation of the cast on the left foot I think that will be fine we probably just need to bring him in sooner to change this not last a whole week. 1/29; we brought the patient back today for a total contact cast change on the left out of concern for excessive drainage. We are using drawtex over the wound as the primary dressing 11/21/2019 on evaluation today patient appears to be doing well with regard to his left plantar foot. In fact both foot ulcers actually seem to be doing pretty well. Nonetheless he is having a lot of drainage  on the left at this time and again we did obtain a wound culture did show positive for Staph aureus that was reviewed by myself today as well. Nonetheless he is on Bactrim which was shown to be sensitive that should be helping in this regard. Fortunately there is no signs of infection systemically at this point. 2/5; back in clinic today for a total contact cast change apparently secondary to very significant drainage. Still using drawtex 11/28/2019 upon evaluation today patient appears to be doing well with regard to his wounds. The right foot is doing okay as measured about the same in my opinion. The left foot is actually showing signs of significant improvement is measuring smaller there is a lot of hyper granulation likely due to the continued drainage at this point. We did obtain approval for a snap VAC I think that is good to be appropriate for him and will likely help this tremendously underneath the cast. He is definitely in agreement with proceeding with such. 2/12; patient came in today after his snap VAC lost suction. Brought in to see one of our nurses. The dressing was replaced and then we put the cast back on and rehooked up the snap VAC. Apparently his wound looked very good per our intake nurse. 2/15; again we replaced the cast on Friday. By Saturday the snap VAC and light suction. He called this morning he comes in acutely. The wounds look fine  however the VAC is not functioning. We replaced the cast using silver alginate as the primary dressing backed with Kerramax. The snap VAC was not replaced 12/05/2019 upon evaluation today patient appears to be doing better in regard to his left plantar foot ulcer. Fortunately there is no signs of active infection at this time. Unfortunately he is continuing to have issues with the right foot he is really not making any progress here things seem to be somewhat stagnant to be honest. The depth has increased but that is due to me having debrided the  wound in the past based on what I am seeing. 12/12/2019 on evaluation today patient appears to be doing more poorly in regard to the left lower extremity. He has some erythema spreading up the side of his foot I am concerned about infection again at this point. Unfortunately he has been seeing improvement with a total contact cast but I do not think we should put that on today. On his right plantar foot he continues to have significant drainage this is actually measuring deeper I really do not feel like you are making any progress whatsoever. I have prescribed Granix for him unfortunately his insurance apparently was going to cost him a $500 co-pay. 12/19/2019 upon evaluation today patient actually appears to be doing better in regard to both wounds. With that being said he actually did get the reGranix which he had to pay $500 for. With that being said it does look like that he is actually made some improvement based on what I am seeing at this point with the reGranix. Obviously if he is going to continue this we are going to do something about trying to get him some help in covering the cost. 12/26/2019 on evaluation today patient appears to be doing really much better even compared to last week. Overall the wound seems to be much better even compared to last week and last week was better than the week before. Since has been using the reGranix his symptoms have improved significantly. With that being said the issue right now is simply that this is a very expensive medication for him the first dose cost him $500. Upon inspection patient's wound bed actually is however dramatically improved compared to before he started this 2 weeks ago. 01/02/2020 upon evaluation today patient appears to actually be doing well. He still had a little bit of reGranix left that has been using in small amounts he just been applying it every other day instead of every day in order to make it go longer. Overall we are still  seeing excellent improvement he is measuring smaller looking better healthier tissue and everything seems to be pointing to this headed in the right direction. Fortunately there is no evidence of infection either which is also excellent news. He does have his MRI coming up within the next week. 01/09/2020 upon evaluation today patient appears to be doing a little worse today compared to previous week's evaluation. He is actually been out of the reGranix at this point. He has been trying to make the stretch out so he has been changing the dressings on a regular set schedule like he was previous. I think this has made a difference. Fortunately there is no signs of active infection at this time. No fevers, chills, nausea, vomiting, or diarrhea. 01/16/2020 upon evaluation today patient appears to be doing well with regard to his left plantar foot ulcer. The right plantar foot still shows some significant depth at this point.  Fortunately there is no signs of active infection at this time. 01/30/2020 upon evaluation today patient appears to be doing about the same in regard to his right plantar foot ulcer there is still some depth here and we had to wait till he actually switched over to his new insurance to get approval for the MRI under his new insurance plan. With that being said he now has switched as of April 1. Fortunately there is no signs of active infection at this time. Overall in regard to his left foot ulcer this seems to be doing much better and I am actually very pleased with how things are going. With that being said it is not quite as much progress as we were seeing with the reGranix but at the same time he has had trouble getting this apparently there is been some hindrance here. I Ernie Hew try to actually send this to [facility] that was recommended by the drug rep to me. 02/13/20 upon evaluation today patient appears to be doing better in regard to his left foot ulcer this is great news.  Unfortunately the right foot ulcer is not really significantly better at this time. There is no signs of active infection systemically though he did have his MRI which showed unfortunately he does have infection noted including an abscess in the foot. There is also marrow changes noted which are consistent with osteomyelitis based on the radiology review and interpretation. Unfortunately considering that the wound is not really making the progress that we will he would like to be seen I think that this is an indication that he may need some further referral both infectious disease as well as potentially to podiatrist to see if there is anything that can be done to help with the situation that were dealing with here. The left foot again is doing great. READMISSION 06/04/2021 This is a 60 year old man who was in the clinic in 2021 followed by Allen Derry for areas on the right and left foot. He developed a left foot infection and was referred to ID. He left the clinic in a nonhealed state and was followed for a period of [facility] Marylene Land. Apparently things really deteriorated in early July when he was admitted to hospital from 04/27/2021 through 05/06/2021 with sepsis secondary to a left foot infection. His blood cultures were negative. An MRI suggested fifth metatarsal osteomyelitis a left ankle septic joint. He was treated with vancomycin and ertapenem which he is still taking and may just about be finishing. He was seen by orthopedics and the patient adamantly refused to BKA. As far as I can tell he did not have the ankle aspirated I am not exactly sure what the issue was here. Since he has been discharged she is at [facility] health care for rehabilitation. He was last seen by Dr. Algis Liming on 05/20/2021 he noted osteo of the tibial talar bone cuboid and fifth metatarsal which is even more extensive than what was suggested by the MRI. He is apparently going for a consultation with  orthopedic surgery in [place] sometime next week. I received a call about this man 2 weeks ago from Dr. Allyson Sabal who follows him for the possibility of PAD. ABIs I think done in the office showed a ABI on the right of 1.05 at the PTA and 0.99 at the PTA on the left. He had a DVT rule out in the left leg that was negative for the DVT. Past medical history is extensive  and includes diastolic heart failure, right first metatarsal head ulcer in 2021, excision of the right second ray by Dr. Marylene Land on 03/13/2020, hypertension, hypothyroidism. Left total hip replacement, right total knee replacement, carpal tunnel syndrome, obstructive sleep apnea alcohol abuse with cirrhosis although the patient denies current alcohol intake. The patient does not think he is a diabetic however looking through [place] link I see 2 HgbAic's of this year that were greater than equal to 6.5 which by definition makes him diabetic. Nevertheless he is not on any treatment and does not check his blood sugars. The patient is now back home out of the nursing home. Saw Dr. Algis Liming last week he was taken off vancomycin and ertapenem on August 22 and now is on doxycycline on Augmentin. He also saw Dr. Weston Anna who is his orthopedic surgeon in [place] he recommended a [employer]	[place]. He has been using Medihoney. 9/6I have been having trouble getting hyperbarics approved through our prior authorization process. Even though he had a limb threatening infection in the left foot and probably the left ankle there glitches in how some of the reports are worded also some of the consultants. In any case I am going to repeat his sedimentation rate and C-reactive protein. I am generally not in favor of doing things like this as they really do not alter the plan of care from my point of view however I am going to need to demonstrate that these remain high in order to get this through forhyperbaric treatment for chronic refractory osteomyelitis 9/13;  following this man for a wound on his left plantar foot in the setting of type 2 diabetes and Charcot deformity. He has underlying chronic refractory osteomyelitis. Follow-up sedimentation rate and C-reactive protein were both elevated but the C-reactive protein was down to 1.4, sedimentation rate at 70. Sedimentation rate was only slightly down from previous at 85. His wound is measuring slightly smaller. Electronic Signature(s) Signed: 07/01/2021 3:40:47 PM By: Baltazar Najjar MD Entered By: Baltazar Najjar on 06/30/2021 09:36:16 -------------------------------------------------------------------------------- Physical Exam Details Patient Name: Date of Service: XAN, INGRAHAM [name] 06/30/2021 8:45 A M Medical Record Number: [id number] Patient Account Number: [id number] Date of Birth/Sex: Treating RN: September 21, 1961 (60 y.o. [name] Primary Care Provider: [name] Other Clinician: Referring Provider: Treating Provider/Extender: Avie Echevaria in Treatment: 3 Constitutional Sitting or standing Blood Pressure is within target range for patient.. Pulse regular and within target range for patient.Marland Kitchen Respirations regular, non-labored and within target range.. Temperature is normal and within the target range for the patient.Marland Kitchen Appears in no distress. Cardiovascular Pedal pulses are palpable. Notes Wound exam; mid part of the left plantar foot. The wound still has thick skin and callus around and under illumination of fibrinous surface debris. I used a #5 curette to remove both of these hemostasis with a pressure dressing. Beyond this there is no clear evidence of infection Electronic Signature(s) Signed: 07/01/2021 3:40:47 PM By: Baltazar Najjar MD Entered By: Baltazar Najjar on 06/30/2021 09:37:10 -------------------------------------------------------------------------------- Physician Orders Details Patient Name: Date of Service: [name] 06/30/2021 8:45 A  M Medical Record Number: [id number] Patient Account Number: [id number] Date of Birth/Sex: Treating RN: May 02, 1961 (60 y.o. [name] Primary Care Provider: [name] Other Clinician: Referring Provider: Treating Provider/Extender: Avie Echevaria in Treatment: 3 Verbal / Phone Orders: No Diagnosis Coding ICD-10 Coding Code Description E11.621 Type 2 diabetes mellitus with foot ulcer M14.672 Charcot's joint, left ankle and foot L97.528 Non-pressure chronic ulcer of  other part of left foot with other specified severity M86.372 Chronic multifocal osteomyelitis, left ankle and foot Follow-up Appointments ppointment in 1 week. - Tuesday with Dr. Leanord Hawking, cast prep before HBO Return A Friday 07/10/21 for cast change Bathing/ Shower/ Hygiene May shower and wash wound with soap and water. - prior to dressing change Edema Control - Lymphedema / SCD / Other Elevate legs to the level of the heart or above for 30 minutes daily and/or when sitting, a frequency of: - throughout the day Avoid standing for long periods of time. Exercise regularly Moisturize legs daily. Additional Orders / Instructions Follow Nutritious Diet Hyperbaric Oxygen Therapy Wound #3 Left,Plantar Foot Evaluate for HBO Therapy - Starts 07/07/21 Indication: - Chronic Refractory Osteomyelitis 2.5 ATA for 90 Minutes with 2 Five (5) Minute A Breaks ir Total Number of Treatments: - 40 One treatments per day (delivered Monday through Friday unless otherwise specified in Special Instructions below): Finger stick Blood Glucose Pre- and Post- HBOT Treatment. Follow Hyperbaric Oxygen Glycemia Protocol Afrin (Oxymetazoline HCL) 0.05% nasal spray - 1 spray in both nostrils daily as needed prior to HBO treatment for difficulty clearing ears Wound Treatment Wound #3 - Foot Wound Laterality: Plantar, Left Cleanser: Soap and Water 1 x Per Day Discharge Instructions: May shower and wash wound with dial  antibacterial soap and water prior to dressing change. Cleanser: Wound Cleanser 1 x Per Day Discharge Instructions: Cleanse the wound with wound cleanser prior to applying a clean dressing using gauze sponges, not tissue or cotton balls. Prim Dressing: MediHoney Calcium Alginate Dressing 4x5 in 1 x Per Day ary Discharge Instructions: Apply to wound bed as instructed Secondary Dressing: Woven Gauze Sponge, Non-Sterile 4x4 in 1 x Per Day Discharge Instructions: Apply over primary dressing as directed. Secondary Dressing: ABD Pad, 5x9 1 x Per Day Discharge Instructions: Apply over primary dressing as directed. Secured With: [employer], 4.5x3.1 (in/yd) 1 x Per Day Discharge Instructions: Secure with Kerlix as directed. Secured With: 24M Medipore H Soft Cloth Surgical Tape, 2x2 (in/yd) 1 x Per Day Discharge Instructions: Secure dressing with tape as directed. GLYCEMIA INTERVENTIONS PROTOCOL PRE-HBO GLYCEMIA INTERVENTIONS ACTION INTERVENTION Obtain pre-HBO capillary blood glucose (ensure 1 physician order is in chart). A. Notify HBO physician and await physician orders. 2 If result is 70 mg/dl or below: B. If the result meets the hospital definition of a critical result, follow hospital policy. A. Give patient an 8 ounce Glucerna Shake, an 8 ounce Ensure, or 8 ounces of a Glucerna/Ensure equivalent dietary supplement*. B. Wait 30 minutes. If result is 71 mg/dl to 086 mg/dl: C. Retest patients capillary blood glucose (CBG). D. If result greater than or equal to 110 mg/dl, proceed with HBO. If result less than 110 mg/dl, notify HBO physician and consider holding HBO. If result is 131 mg/dl to 578 mg/dl: A. Proceed with HBO. A. Notify HBO physician and await physician orders. B. It is recommended to hold HBO and do If result is 250 mg/dl or greater: blood/urine ketone testing. C. If the result meets the hospital definition of a critical result, follow hospital  policy. POST-HBO GLYCEMIA INTERVENTIONS ACTION INTERVENTION Obtain post HBO capillary blood glucose (ensure 1 physician order is in chart). A. Notify HBO physician and await physician orders. 2 If result is 70 mg/dl or below: B. If the result meets the hospital definition of a critical result, follow hospital policy. A. Give patient an 8 ounce Glucerna Shake, an 8 ounce Ensure, or 8 ounces of a Glucerna/Ensure  equivalent dietary supplement*. B. Wait 15 minutes for symptoms of If result is 71 mg/dl to 161 mg/dl: hypoglycemia (i.e. nervousness, anxiety, sweating, chills, clamminess, irritability, confusion, tachycardia or dizziness). C. If patient asymptomatic, discharge patient. If patient symptomatic, repeat capillary blood glucose (CBG) and notify HBO physician. If result is 101 mg/dl to 096 mg/dl: A. Discharge patient. A. Notify HBO physician and await physician orders. B. It is recommended to do blood/urine ketone If result is 250 mg/dl or greater: testing. C. If the result meets the hospital definition of a critical result, follow hospital policy. *Juice or candies are NOT equivalent products. If patient refuses the Glucerna or Ensure, please consult the hospital dietitian for an appropriate substitute. Electronic Signature(s) Signed: 06/30/2021 4:34:23 PM By: [name] Signed: 07/01/2021 3:40:47 PM By: Baltazar Najjar MD Entered By: [name] on 06/30/2021 09:33:59 -------------------------------------------------------------------------------- Problem List Details Patient Name: Date of Service: RUCKER, PRIDGEON [name] 06/30/2021 8:45 A M Medical Record Number: [id number] Patient Account Number: [id number] Date of Birth/Sex: Treating RN: 04-15-1961 (60 y.o. [name] Primary Care Provider: [name] Other Clinician: Referring Provider: Treating Provider/Extender: Avie Echevaria in Treatment: 3 Active  Problems ICD-10 Encounter Code Description Active Date MDM Diagnosis E11.621 Type 2 diabetes mellitus with foot ulcer 06/04/2021 No Yes M14.672 Charcot's joint, left ankle and foot 06/04/2021 No Yes L97.528 Non-pressure chronic ulcer of other part of left foot with other specified 06/04/2021 No Yes severity M86.372 Chronic multifocal osteomyelitis, left ankle and foot 06/04/2021 No Yes Inactive Problems Resolved Problems Electronic Signature(s) Signed: 07/01/2021 3:40:47 PM By: Baltazar Najjar MD Entered By: Baltazar Najjar on 06/30/2021 09:34:31 -------------------------------------------------------------------------------- Progress Note Details Patient Name: Date of Service: [name] 06/30/2021 8:45 A M Medical Record Number: [id number] Patient Account Number: [id number] Date of Birth/Sex: Treating RN: 1960-12-17 (60 y.o. [name] Primary Care Provider: [name] Other Clinician: Referring Provider: Treating Provider/Extender: Avie Echevaria in Treatment: 3 Subjective History of Present Illness (HPI) 10/31/2019 upon evaluation today patient appears to be doing somewhat poorly in regard to his bilateral plantar feet. He has wounds that he tells me have been present since 2012 intermittently off and on. Most recently this has been open for at least the past 6 months to a year. He has been trying to treat this in different ways using Santyl along with various other dressings including Medihoney and even at one point Xeroform. Nothing really has seem to get this completely closed. He was recently in the hospital for cellulitis of his leg subsequently he did have x-rays as well as MRIs that showed negative for any signs of osteomyelitis in regard to the wounds on his feet. Fortunately there is no signs of systemic infection at this time. No fevers, chills, nausea, vomiting, or diarrhea. Patient has previously used Darco offloading shoes as far as  frontal floaters as well as postop surgical shoes. He has never been in a total contact cast that may be something we need to strongly consider here. Patient's most recent hemoglobin A1c 1 month ago was 5.3 seems to be very well controlled which is great. Subsequently he has seen vascular as well as podiatry. His ABIs are 1.07 on the left and 1.14 on the right he seems to be doing well he does have chronic venous stasis. 11/07/2019 upon evaluation today patient appears to be doing well with regard to his wounds all things considered. I do not see any severe worsening he still has some callus buildup on  the right more than the left he notes that he has been probably more active than he should as far as walking is concerned is just very hard to not be active. He knows he needs to be more careful in this regard however. He is willing to give the cast a try at this point although he notes that he is a little nervous about this just with regard to balance although he will be very careful and obviously if he has any trouble he knows to contact the office and let me know. 1/22; patient is in for his obligatory first total contact cast change. Our intake nurse reported a very large amount of drainage which is spelled out over to the surrounding skin. Has bilateral diabetic foot wounds. He has Charcot feet. We have been using silver alginate on his wounds. 11/14/2019 on evaluation today patient is actually seeming to make good improvement in regard to his bilateral plantar foot wounds. We have been using a cast on the left side and on the right side he has been using dressings he is changing up his own accord. With that being said he tells me that he is also not walking as much just due to how unsteady he feels. He takes it easy when he does have to walk and when he does not have to walk he is resting. This is probably help in his right foot as well has the left foot which is actually measuring better. In fact  both are measuring better. Overall I am very pleased with how things seem to be progressing. The patient does have some odor on the left foot this does have me concerned about the possibility of infection, and actually probably go ahead and put him on antibiotics today as well as utilizing a continuation of the cast on the left foot I think that will be fine we probably just need to bring him in sooner to change this not last a whole week. 1/29; we brought the patient back today for a total contact cast change on the left out of concern for excessive drainage. We are using drawtex over the wound as the primary dressing 11/21/2019 on evaluation today patient appears to be doing well with regard to his left plantar foot. In fact both foot ulcers actually seem to be doing pretty well. Nonetheless he is having a lot of drainage on the left at this time and again we did obtain a wound culture did show positive for Staph aureus that was reviewed by myself today as well. Nonetheless he is on Bactrim which was shown to be sensitive that should be helping in this regard. Fortunately there is no signs of infection systemically at this point. 2/5; back in clinic today for a total contact cast change apparently secondary to very significant drainage. Still using drawtex 11/28/2019 upon evaluation today patient appears to be doing well with regard to his wounds. The right foot is doing okay as measured about the same in my opinion. The left foot is actually showing signs of significant improvement is measuring smaller there is a lot of hyper granulation likely due to the continued drainage at this point. We did obtain approval for a snap VAC I think that is good to be appropriate for him and will likely help this tremendously underneath the cast. He is definitely in agreement with proceeding with such. 2/12; patient came in today after his snap VAC lost suction. Brought in to see one of our nurses. The  dressing was  replaced and then we put the cast back on and rehooked up the snap VAC. Apparently his wound looked very good per our intake nurse. 2/15; again we replaced the cast on Friday. By Saturday the snap VAC and light suction. He called this morning he comes in acutely. The wounds look fine however the VAC is not functioning. We replaced the cast using silver alginate as the primary dressing backed with Kerramax. The snap VAC was not replaced 12/05/2019 upon evaluation today patient appears to be doing better in regard to his left plantar foot ulcer. Fortunately there is no signs of active infection at this time. Unfortunately he is continuing to have issues with the right foot he is really not making any progress here things seem to be somewhat stagnant to be honest. The depth has increased but that is due to me having debrided the wound in the past based on what I am seeing. 12/12/2019 on evaluation today patient appears to be doing more poorly in regard to the left lower extremity. He has some erythema spreading up the side of his foot I am concerned about infection again at this point. Unfortunately he has been seeing improvement with a total contact cast but I do not think we should put that on today. On his right plantar foot he continues to have significant drainage this is actually measuring deeper I really do not feel like you are making any progress whatsoever. I have prescribed Granix for him unfortunately his insurance apparently was going to cost him a $500 co-pay. 12/19/2019 upon evaluation today patient actually appears to be doing better in regard to both wounds. With that being said he actually did get the reGranix which he had to pay $500 for. With that being said it does look like that he is actually made some improvement based on what I am seeing at this point with the reGranix. Obviously if he is going to continue this we are going to do something about trying to get him some help in covering  the cost. 12/26/2019 on evaluation today patient appears to be doing really much better even compared to last week. Overall the wound seems to be much better even compared to last week and last week was better than the week before. Since has been using the reGranix his symptoms have improved significantly. With that being said the issue right now is simply that this is a very expensive medication for him the first dose cost him $500. Upon inspection patient's wound bed actually is however dramatically improved compared to before he started this 2 weeks ago. 01/02/2020 upon evaluation today patient appears to actually be doing well. He still had a little bit of reGranix left that has been using in small amounts he just been applying it every other day instead of every day in order to make it go longer. Overall we are still seeing excellent improvement he is measuring smaller looking better healthier tissue and everything seems to be pointing to this headed in the right direction. Fortunately there is no evidence of infection either which is also excellent news. He does have his MRI coming up within the next week. 01/09/2020 upon evaluation today patient appears to be doing a little worse today compared to previous week's evaluation. He is actually been out of the reGranix at this point. He has been trying to make the stretch out so he has been changing the dressings on a regular set schedule like he was previous.  I think this has made a difference. Fortunately there is no signs of active infection at this time. No fevers, chills, nausea, vomiting, or diarrhea. 01/16/2020 upon evaluation today patient appears to be doing well with regard to his left plantar foot ulcer. The right plantar foot still shows some significant depth at this point. Fortunately there is no signs of active infection at this time. 01/30/2020 upon evaluation today patient appears to be doing about the same in regard to his right plantar  foot ulcer there is still some depth here and we had to wait till he actually switched over to his new insurance to get approval for the MRI under his new insurance plan. With that being said he now has switched as of April 1. Fortunately there is no signs of active infection at this time. Overall in regard to his left foot ulcer this seems to be doing much better and I am actually very pleased with how things are going. With that being said it is not quite as much progress as we were seeing with the reGranix but at the same time he has had trouble getting this apparently there is been some hindrance here. I Ernie Hew try to actually send this to [facility] that was recommended by the drug rep to me. 02/13/20 upon evaluation today patient appears to be doing better in regard to his left foot ulcer this is great news. Unfortunately the right foot ulcer is not really significantly better at this time. There is no signs of active infection systemically though he did have his MRI which showed unfortunately he does have infection noted including an abscess in the foot. There is also marrow changes noted which are consistent with osteomyelitis based on the radiology review and interpretation. Unfortunately considering that the wound is not really making the progress that we will he would like to be seen I think that this is an indication that he may need some further referral both infectious disease as well as potentially to podiatrist to see if there is anything that can be done to help with the situation that were dealing with here. The left foot again is doing great. READMISSION 06/04/2021 This is a 60 year old man who was in the clinic in 2021 followed by Allen Derry for areas on the right and left foot. He developed a left foot infection and was referred to ID. He left the clinic in a nonhealed state and was followed for a period of [facility] Marylene Land. Apparently things  really deteriorated in early July when he was admitted to hospital from 04/27/2021 through 05/06/2021 with sepsis secondary to a left foot infection. His blood cultures were negative. An MRI suggested fifth metatarsal osteomyelitis a left ankle septic joint. He was treated with vancomycin and ertapenem which he is still taking and may just about be finishing. He was seen by orthopedics and the patient adamantly refused to BKA. As far as I can tell he did not have the ankle aspirated I am not exactly sure what the issue was here. Since he has been discharged she is at [facility] health care for rehabilitation. He was last seen by Dr. Algis Liming on 05/20/2021 he noted osteo of the tibial talar bone cuboid and fifth metatarsal which is even more extensive than what was suggested by the MRI. He is apparently going for a consultation with orthopedic surgery in [place] sometime next week. I received a call about this man 2 weeks ago from Dr. Allyson Sabal  who follows him for the possibility of PAD. ABIs I think done in the office showed a ABI on the right of 1.05 at the PTA and 0.99 at the PTA on the left. He had a DVT rule out in the left leg that was negative for the DVT. Past medical history is extensive and includes diastolic heart failure, right first metatarsal head ulcer in 2021, excision of the right second ray by Dr. Marylene Land on 03/13/2020, hypertension, hypothyroidism. Left total hip replacement, right total knee replacement, carpal tunnel syndrome, obstructive sleep apnea alcohol abuse with cirrhosis although the patient denies current alcohol intake. The patient does not think he is a diabetic however looking through [place] link I see 2 HgbAic's of this year that were greater than equal to 6.5 which by definition makes him diabetic. Nevertheless he is not on any treatment and does not check his blood sugars. The patient is now back home out of the nursing home. Saw Dr. Algis Liming last week he was taken off  vancomycin and ertapenem on August 22 and now is on doxycycline on Augmentin. He also saw Dr. Weston Anna who is his orthopedic surgeon in [place] he recommended a [employer]	[place]. He has been using Medihoney. 9/6I have been having trouble getting hyperbarics approved through our prior authorization process. Even though he had a limb threatening infection in the left foot and probably the left ankle there glitches in how some of the reports are worded also some of the consultants. In any case I am going to repeat his sedimentation rate and C-reactive protein. I am generally not in favor of doing things like this as they really do not alter the plan of care from my point of view however I am going to need to demonstrate that these remain high in order to get this through forhyperbaric treatment for chronic refractory osteomyelitis 9/13; following this man for a wound on his left plantar foot in the setting of type 2 diabetes and Charcot deformity. He has underlying chronic refractory osteomyelitis. Follow-up sedimentation rate and C-reactive protein were both elevated but the C-reactive protein was down to 1.4, sedimentation rate at 70. Sedimentation rate was only slightly down from previous at 85. His wound is measuring slightly smaller. Objective Constitutional Sitting or standing Blood Pressure is within target range for patient.. Pulse regular and within target range for patient.Marland Kitchen Respirations regular, non-labored and within target range.. Temperature is normal and within the target range for the patient.Marland Kitchen Appears in no distress. Vitals Time Taken: 8:50 AM, Height: 73 in, Weight: 270 lbs, BMI: 35.6, Temperature: 98.0 F, Pulse: 112 bpm, Respiratory Rate: 17 breaths/min, Blood Pressure: 129/78 mmHg. Cardiovascular Pedal pulses are palpable. General Notes: Wound exam; mid part of the left plantar foot. The wound still has thick skin and callus around and under illumination of fibrinous surface debris.  I used a #5 curette to remove both of these hemostasis with a pressure dressing. Beyond this there is no clear evidence of infection Integumentary (Hair, Skin) Wound #3 status is Open. Original cause of wound was Gradually Appeared. The date acquired was: 03/18/2021. The wound has been in treatment 3 weeks. The wound is located on the Left,Plantar Foot. The wound measures 1.9cm length x 2.9cm width x 0.3cm depth; 4.328cm^2 area and 1.298cm^3 volume. There is Fat Layer (Subcutaneous Tissue) exposed. There is no tunneling or undermining noted. There is a medium amount of serosanguineous drainage noted. The wound margin is distinct with the outline attached to the wound base. There  is large (67-100%) red granulation within the wound bed. There is no necrotic tissue within the wound bed. General Notes: Calloused Periwound Assessment Active Problems ICD-10 Type 2 diabetes mellitus with foot ulcer Charcot's joint, left ankle and foot Non-pressure chronic ulcer of other part of left foot with other specified severity Chronic multifocal osteomyelitis, left ankle and foot Procedures Wound #3 Pre-procedure diagnosis of Wound #3 is a Diabetic Wound/Ulcer of the Lower Extremity located on the Left,Plantar Foot .Severity of Tissue Pre Debridement is: Fat layer exposed. There was a Excisional Skin/Subcutaneous Tissue Debridement with a total area of 5.51 sq cm performed by Maxwell Caul., MD. With the following instrument(s): Curette to remove Non-Viable tissue/material. Material removed includes Callus and Subcutaneous Tissue and after achieving pain control using Other (benzocaine 20%). No specimens were taken. A time out was conducted at 09:17, prior to the start of the procedure. A Minimum amount of bleeding was controlled with Pressure. The procedure was tolerated well. Post Debridement Measurements: 1.9cm length x 2.9cm width x 0.3cm depth; 1.298cm^3 volume. Character of Wound/Ulcer Post Debridement  is stable. Severity of Tissue Post Debridement is: Fat layer exposed. Post procedure Diagnosis Wound #3: Same as Pre-Procedure Plan Follow-up Appointments: Return Appointment in 1 week. - Tuesday with Dr. Leanord Hawking, cast prep before HBO Friday 07/10/21 for cast change Bathing/ Shower/ Hygiene: May shower and wash wound with soap and water. - prior to dressing change Edema Control - Lymphedema / SCD / Other: Elevate legs to the level of the heart or above for 30 minutes daily and/or when sitting, a frequency of: - throughout the day Avoid standing for long periods of time. Exercise regularly Moisturize legs daily. Additional Orders / Instructions: Follow Nutritious Diet Hyperbaric Oxygen Therapy: Wound #3 Left,Plantar Foot: Evaluate for HBO Therapy - Starts 07/07/21 Indication: - Chronic Refractory Osteomyelitis 2.5 ATA for 90 Minutes with 2 Five (5) Minute Air Breaks T Number of Treatments: - 40 otal One treatments per day (delivered Monday through Friday unless otherwise specified in Special Instructions below): Finger stick Blood Glucose Pre- and Post- HBOT Treatment. Follow Hyperbaric Oxygen Glycemia Protocol Afrin (Oxymetazoline HCL) 0.05% nasal spray - 1 spray in both nostrils daily as needed prior to HBO treatment for difficulty clearing ears WOUND #3: - Foot Wound Laterality: Plantar, Left Cleanser: Soap and Water 1 x Per Day/ Discharge Instructions: May shower and wash wound with dial antibacterial soap and water prior to dressing change. Cleanser: Wound Cleanser 1 x Per Day/ Discharge Instructions: Cleanse the wound with wound cleanser prior to applying a clean dressing using gauze sponges, not tissue or cotton balls. Prim Dressing: MediHoney Calcium Alginate Dressing 4x5 in 1 x Per Day/ ary Discharge Instructions: Apply to wound bed as instructed Secondary Dressing: Woven Gauze Sponge, Non-Sterile 4x4 in 1 x Per Day/ Discharge Instructions: Apply over primary dressing as  directed. Secondary Dressing: ABD Pad, 5x9 1 x Per Day/ Discharge Instructions: Apply over primary dressing as directed. Secured With: [employer], 4.5x3.1 (in/yd) 1 x Per Day/ Discharge Instructions: Secure with Kerlix as directed. Secured With: 48M Medipore H Soft Cloth Surgical T ape, 2x2 (in/yd) 1 x Per Day/ Discharge Instructions: Secure dressing with tape as directed. 1. We continued with the Medihoney 2. He has been approved for HBO wishes to start next Tuesday. 3. I will consider a total contact cast at that point if the surface of the wound appears to be adequately debrided 4. We used MolecuLight in a training exercise for our staff. This  was a negative study. However negative predictive accuracy is not that high although my clinical suspicion was also not that high. Certainly not enough to do cultures at that point. Electronic Signature(s) Signed: 07/01/2021 3:40:47 PM By: Baltazar Najjar MD Entered By: Baltazar Najjar on 06/30/2021 09:38:52 -------------------------------------------------------------------------------- SuperBill Details Patient Name: Date of Service: [name] 06/30/2021 Medical Record Number: [id number] Patient Account Number: [id number] Date of Birth/Sex: Treating RN: 1960-10-28 (60 y.o. [name] Primary Care Provider: [name] Other Clinician: Referring Provider: Treating Provider/Extender: Avie Echevaria in Treatment: 3 Diagnosis Coding ICD-10 Codes Code Description E11.621 Type 2 diabetes mellitus with foot ulcer M14.672 Charcot's joint, left ankle and foot L97.528 Non-pressure chronic ulcer of other part of left foot with other specified severity M86.372 Chronic multifocal osteomyelitis, left ankle and foot Facility Procedures CPT4 Code: [id number] Description: 11042 - DEB SUBQ TISSUE 20 SQ CM/< ICD-10 Diagnosis Description L97.528 Non-pressure chronic ulcer of other part of left foot with other specified  seve Modifier: rity Quantity: 1 Physician Procedures : CPT4 Code Description Modifier [id number] 11042 - WC PHYS SUBQ TISS 20 SQ CM ICD-10 Diagnosis Description L97.528 Non-pressure chronic ulcer of other part of left foot with other specified severity Quantity: 1 Electronic Signature(s) Signed: 07/01/2021 3:40:47 PM By: Baltazar Najjar MD Entered By: Baltazar Najjar on 06/30/2021 09:39:03

## 2021-07-01 NOTE — Progress Notes (Signed)
[name], [name] ([id number]) Visit Report for 06/30/2021 Arrival Information Details Patient Name: Date of Service: [name], [name] 06/30/2021 8:45 A M Medical Record Number: [id number] Patient Account Number: [id number] Date of Birth/Sex: Treating RN: April 15, 1961 (60 y.o. Charlean Merl, Lauren Primary Care Idil Maslanka: [name] Other Clinician: Referring Ardith Lewman: Treating Lynelle Weiler/Extender: Avie Echevaria in Treatment: 3 Visit Information History Since Last Visit Added or deleted any medications: No Patient Arrived: [name] Any new allergies or adverse reactions: No Arrival Time: 08:50 Had a fall or experienced change in No Accompanied By: wife activities of daily living that may affect Transfer Assistance: None risk of falls: Patient Identification Verified: Yes Signs or symptoms of abuse/neglect since last visito No Secondary Verification Process Completed: Yes Hospitalized since last visit: No Patient Requires Transmission-Based Precautions: No Implantable device outside of the clinic excluding No Patient Has Alerts: Yes cellular tissue based products placed in the center Patient Alerts: L ABI: 0.99 since last visit: R ABI: 1.05 Has Dressing in Place as Prescribed: Yes Pain Present Now: No Electronic Signature(s) Signed: 06/30/2021 10:48:45 AM By: Karl Ito Entered By: Karl Ito on 06/30/2021 08:50:49 -------------------------------------------------------------------------------- Encounter Discharge Information Details Patient Name: Date of Service: [name] 06/30/2021 8:45 A M Medical Record Number: [id number] Patient Account Number: [id number] Date of Birth/Sex: Treating RN: 15-Jun-1961 (60 y.o. [name] Primary Care Dea Bitting: [name] Other Clinician: Referring Shylo Dillenbeck: Treating Wave Calzada/Extender: Avie Echevaria in Treatment: 3 Encounter Discharge Information Items Post Procedure Vitals Discharge  Condition: Stable Temperature (F): 98 Ambulatory Status: Walker Pulse (bpm): 112 Discharge Destination: Home Respiratory Rate (breaths/min): 17 Transportation: Private Auto Blood Pressure (mmHg): 129/78 Accompanied By: spouse Schedule Follow-up Appointment: Yes Clinical Summary of Care: Provided on 06/30/2021 Form Type Recipient Paper Patient Patient Electronic Signature(s) Signed: 06/30/2021 4:34:23 PM By: [name] Entered By: [name] on 06/30/2021 09:51:15 -------------------------------------------------------------------------------- Lower Extremity Assessment Details Patient Name: Date of Service: [name], [name] 06/30/2021 8:45 A M Medical Record Number: [id number] Patient Account Number: [id number] Date of Birth/Sex: Treating RN: 12/24/1960 (60 y.o. [name] Primary Care Terrez Ander: [name] Other Clinician: Referring Jakiera Ehler: Treating Tyreon Frigon/Extender: Avie Echevaria in Treatment: 3 Edema Assessment Assessed: [name]: Yes] [Right: No] Edema: [Left: Ye] [Right: s] Calf Left: Right: Point of Measurement: From Medial Instep 41 cm Ankle Left: Right: Point of Measurement: From Medial Instep 24.6 cm Vascular Assessment Pulses: Dorsalis Pedis Palpable: [Left:Yes] Electronic Signature(s) Signed: 06/30/2021 4:34:23 PM By: [name] Entered By: [name] on 06/30/2021 09:00:47 -------------------------------------------------------------------------------- Multi Wound Chart Details Patient Name: Date of Service: [name] 06/30/2021 8:45 A M Medical Record Number: [id number] Patient Account Number: [id number] Date of Birth/Sex: Treating RN: August 07, 1961 (60 y.o. Charlean Merl, Lauren Primary Care Nikesha Kwasny: [name] Other Clinician: Referring Ezelle Surprenant: Treating Elektra Wartman/Extender: Avie Echevaria in Treatment: 3 Vital Signs Height(in): 73 Pulse(bpm): 112 Weight(lbs): 270 Blood  Pressure(mmHg): 129/78 Body Mass Index(BMI): 36 Temperature(F): 98.0 Respiratory Rate(breaths/min): 17 Photos: [3:Left, Plantar Foot] [N/A:N/A N/A] Wound Location: [3:Gradually Appeared] [N/A:N/A] Wounding Event: [3:Diabetic Wound/Ulcer of the Lower] [N/A:N/A] Primary Etiology: [3:Extremity Anemia, Sleep Apnea, Congestive] [N/A:N/A] Comorbid History: [3:Heart Failure, Hypertension, Peripheral Venous Disease, Cirrhosis , Type II Diabetes, Osteoarthritis, Neuropathy 03/18/2021] [N/A:N/A] Date Acquired: [3:3] [N/A:N/A] Weeks of Treatment: [3:Open] [N/A:N/A] Wound Status: [3:1.9x2.9x0.3] [N/A:N/A] Measurements L x W x D (cm) [3:4.328] [N/A:N/A] A (cm) : rea [3:1.298] [N/A:N/A] Volume (cm) : [3:38.80%] [N/A:N/A] % Reduction in A [3:rea: -83.60%] [N/A:N/A] % Reduction in Volume: [3:Grade 3] [N/A:N/A] Classification: [  3:Medium] [N/A:N/A] Exudate A mount: [3:Serosanguineous] [N/A:N/A] Exudate Type: [3:red, brown] [N/A:N/A] Exudate Color: [3:Distinct, outline attached] [N/A:N/A] Wound Margin: [3:Large (67-100%)] [N/A:N/A] Granulation A mount: [3:Red] [N/A:N/A] Granulation Quality: [3:None Present (0%)] [N/A:N/A] Necrotic A mount: [3:Fat Layer (Subcutaneous Tissue): Yes N/A] Exposed Structures: [3:Fascia: No Tendon: No Muscle: No Joint: No Bone: No None] [N/A:N/A] Epithelialization: [3:Debridement - Excisional] [N/A:N/A] Debridement: Pre-procedure Verification/Time Out 09:17 [N/A:N/A] Taken: [3:Other] [N/A:N/A] Pain Control: [3:Callus, Subcutaneous] [N/A:N/A] Tissue Debrided: [3:Skin/Subcutaneous Tissue] [N/A:N/A] Level: [3:5.51] [N/A:N/A] Debridement A (sq cm): [3:rea Curette] [N/A:N/A] Instrument: [3:Minimum] [N/A:N/A] Bleeding: [3:Pressure] [N/A:N/A] Hemostasis A chieved: [3:Procedure was tolerated well] [N/A:N/A] Debridement Treatment Response: [3:1.9x2.9x0.3] [N/A:N/A] Post Debridement Measurements L x W x D (cm) [3:1.298] [N/A:N/A] Post Debridement Volume: (cm) [3:Calloused  Periwound] [N/A:N/A] Assessment Notes: [3:Debridement] [N/A:N/A] Treatment Notes Electronic Signature(s) Signed: 06/30/2021 5:15:03 PM By: Fonnie Mu RN Signed: 07/01/2021 3:40:47 PM By: Baltazar Najjar MD Entered By: Baltazar Najjar on 06/30/2021 09:34:40 -------------------------------------------------------------------------------- Multi-Disciplinary Care Plan Details Patient Name: Date of Service: [name] 06/30/2021 8:45 A M Medical Record Number: [id number] Patient Account Number: [id number] Date of Birth/Sex: Treating RN: 11-Aug-1961 (60 y.o. [name] Primary Care Froilan Mclean: [name] Other Clinician: Referring Gabriel Paulding: Treating Heyward Douthit/Extender: Avie Echevaria in Treatment: 3 Multidisciplinary Care Plan reviewed with physician Active Inactive HBO Nursing Diagnoses: Anxiety related to feelings of confinement associated with the hyperbaric oxygen chamber Anxiety related to knowledge deficit of hyperbaric oxygen therapy and treatment procedures Discomfort related to temperature and humidity changes inside hyperbaric chamber Potential for barotraumas to ears, sinuses, teeth, and lungs or cerebral gas embolism related to changes in atmospheric pressure inside hyperbaric oxygen chamber Potential for oxygen toxicity seizures related to delivery of 100% oxygen at an increased atmospheric pressure Potential for pulmonary oxygen toxicity related to delivery of 100% oxygen at an increased atmospheric pressure Goals: Barotrauma will be prevented during HBO2 Date Initiated: 06/04/2021 T arget Resolution Date: 08/14/2021 Goal Status: Active Patient and/or family will be able to state/discuss factors appropriate to the management of their disease process during treatment Date Initiated: 06/04/2021 T arget Resolution Date: 08/14/2021 Goal Status: Active Patient will tolerate the hyperbaric oxygen therapy treatment Date Initiated: 06/04/2021 T  arget Resolution Date: 08/14/2021 Goal Status: Active Patient will tolerate the internal climate of the chamber Date Initiated: 06/04/2021 T arget Resolution Date: 08/14/2021 Goal Status: Active Patient/caregiver will verbalize understanding of HBO goals, rationale, procedures and potential hazards Date Initiated: 06/04/2021 T arget Resolution Date: 08/14/2021 Goal Status: Active Signs and symptoms of pulmonary oxygen toxicity will be recognized and promptly addressed Date Initiated: 06/04/2021 T arget Resolution Date: 08/14/2021 Goal Status: Active Signs and symptoms of seizure will be recognized and promptly addressed ; seizing patients will suffer no harm Date Initiated: 06/04/2021 T arget Resolution Date: 08/14/2021 Goal Status: Active Interventions: Administer a five (5) minute air break for patient if signs and symptoms of seizure appear and notify the hyperbaric physician Administer decongestants, per physician orders, prior to HBO2 Administer the correct therapeutic gas delivery based on the patients needs and limitations, per physician order Assess and provide for patients comfort related to the hyperbaric environment and equalization of middle ear Assess for signs and symptoms related to adverse events, including but not limited to confinement anxiety, pneumothorax, oxygen toxicity and baurotrauma Assess patient for any history of confinement anxiety Assess patient's knowledge and expectations regarding hyperbaric medicine and provide education related to the hyperbaric environment, goals of treatment and prevention of adverse events Implement protocols to decrease risk of  pneumothorax in high risk patients Notes: Abuse / Safety / Falls / Self Care Management Nursing Diagnoses: Potential for falls Potential for injury related to falls Goals: Patient will not experience any injury related to falls Date Initiated: 06/04/2021 Target Resolution Date: 07/04/2021 Goal Status:  Active Patient/caregiver will verbalize/demonstrate measures taken to prevent injury and/or falls Date Initiated: 06/04/2021 Target Resolution Date: 07/04/2021 Goal Status: Active Interventions: Assess Activities of Daily Living upon admission and as needed Assess fall risk on admission and as needed Assess: immobility, friction, shearing, incontinence upon admission and as needed Assess impairment of mobility on admission and as needed per policy Assess personal safety and home safety (as indicated) on admission and as needed Provide education on fall prevention Provide education on personal and home safety Notes: Nutrition Nursing Diagnoses: Impaired glucose control: actual or potential Potential for alteratiion in Nutrition/Potential for imbalanced nutrition Goals: Patient/caregiver agrees to and verbalizes understanding of need to use nutritional supplements and/or vitamins as prescribed Date Initiated: 06/04/2021 Target Resolution Date: 07/04/2021 Goal Status: Active Patient/caregiver will maintain therapeutic glucose control Date Initiated: 06/04/2021 Target Resolution Date: 07/04/2021 Goal Status: Active Interventions: Assess HgA1c results as ordered upon admission and as needed Assess patient nutrition upon admission and as needed per policy Provide education on elevated blood sugars and impact on wound healing Notes: Wound/Skin Impairment Nursing Diagnoses: Impaired tissue integrity Knowledge deficit related to ulceration/compromised skin integrity Goals: Patient/caregiver will verbalize understanding of skin care regimen Date Initiated: 06/04/2021 Target Resolution Date: 07/04/2021 Goal Status: Active Interventions: Assess patient/caregiver ability to obtain necessary supplies Assess patient/caregiver ability to perform ulcer/skin care regimen upon admission and as needed Assess ulceration(s) every visit Provide education on ulcer and skin care Notes: Electronic  Signature(s) Signed: 06/30/2021 4:34:23 PM By: [name] Entered By: [name] on 06/30/2021 08:57:19 -------------------------------------------------------------------------------- Pain Assessment Details Patient Name: Date of Service: [name], [name] [name] 06/30/2021 8:45 A M Medical Record Number: [id number] Patient Account Number: [id number] Date of Birth/Sex: Treating RN: 09-10-1961 (60 y.o. [name] Primary Care Shakara Tweedy: [name] Other Clinician: Referring Atonya Templer: Treating Samayah Novinger/Extender: Avie Echevaria in Treatment: 3 Active Problems Location of Pain Severity and Description of Pain Patient Has Paino No Site Locations Pain Management and Medication Current Pain Management: Electronic Signature(s) Signed: 06/30/2021 10:48:45 AM By: Karl Ito Signed: 06/30/2021 5:15:03 PM By: Fonnie Mu RN Entered By: Karl Ito on 06/30/2021 08:51:14 -------------------------------------------------------------------------------- Patient/Caregiver Education Details Patient Name: Date of Service: [name] 9/13/2022andnbsp8:45 A M Medical Record Number: [id number] Patient Account Number: [id number] Date of Birth/Gender: Treating RN: 1961/07/12 (60 y.o. [name] Primary Care Physician: [name] Other Clinician: Referring Physician: Treating Physician/Extender: Avie Echevaria in Treatment: 3 Education Assessment Education Provided To: Patient Education Topics Provided Elevated Blood Sugar/ Impact on Healing: Methods: Explain/Verbal Responses: State content correctly Wound/Skin Impairment: Methods: Demonstration, Explain/Verbal, Printed Responses: State content correctly Electronic Signature(s) Signed: 06/30/2021 4:34:23 PM By: [name] Entered By: [name] on 06/30/2021 08:58:05 -------------------------------------------------------------------------------- Wound  Assessment Details Patient Name: Date of Service: [name] 06/30/2021 8:45 A M Medical Record Number: [id number] Patient Account Number: [id number] Date of Birth/Sex: Treating RN: [birth year] (60 y.o. [name] Primary Care Shuaib Corsino: [name] Other Clinician: Referring Diallo Ponder: Treating Shamiracle Gorden/Extender: Avie Echevaria in Treatment: 3 Wound Status Wound Number: 3 Primary Diabetic Wound/Ulcer of the Lower Extremity Etiology: Wound Location: Left, Plantar Foot Wound Open Wounding Event: Gradually Appeared Status: Date Acquired: 03/18/2021 Comorbid Anemia, Sleep Apnea, Congestive Heart Failure,  Hypertension, Weeks Of Treatment: 3 History: Peripheral Venous Disease, Cirrhosis , Type II Diabetes, Clustered Wound: No Osteoarthritis, Neuropathy Photos Wound Measurements Length: (cm) 1.9 Width: (cm) 2.9 Depth: (cm) 0.3 Area: (cm) 4.328 Volume: (cm) 1.298 % Reduction in Area: 38.8% % Reduction in Volume: -83.6% Epithelialization: None Tunneling: No Undermining: No Wound Description Classification: Grade 3 Wound Margin: Distinct, outline attached Exudate Amount: Medium Exudate Type: Serosanguineous Exudate Color: red, brown Foul Odor After Cleansing: No Slough/Fibrino No Wound Bed Granulation Amount: Large (67-100%) Exposed Structure Granulation Quality: Red Fascia Exposed: No Necrotic Amount: None Present (0%) Fat Layer (Subcutaneous Tissue) Exposed: Yes Tendon Exposed: No Muscle Exposed: No Joint Exposed: No Bone Exposed: No Assessment Notes Calloused Periwound Treatment Notes Wound #3 (Foot) Wound Laterality: Plantar, Left Cleanser Soap and Water Discharge Instruction: May shower and wash wound with dial antibacterial soap and water prior to dressing change. Wound Cleanser Discharge Instruction: Cleanse the wound with wound cleanser prior to applying a clean dressing using gauze sponges, not tissue or cotton  balls. Peri-Wound Care Topical Primary Dressing MediHoney Calcium Alginate Dressing 4x5 in Discharge Instruction: Apply to wound bed as instructed Secondary Dressing Woven Gauze Sponge, Non-Sterile 4x4 in Discharge Instruction: Apply over primary dressing as directed. ABD Pad, 5x9 Discharge Instruction: Apply over primary dressing as directed. Secured With [employer], 4.5x3.1 (in/yd) Discharge Instruction: Secure with Kerlix as directed. 60M Medipore H Soft Cloth Surgical T ape, 2x2 (in/yd) Discharge Instruction: Secure dressing with tape as directed. Compression Wrap Compression Stockings Add-Ons Electronic Signature(s) Signed: 06/30/2021 3:39:58 PM By: [name] Entered By: [name] on 06/30/2021 15:39:58 -------------------------------------------------------------------------------- Vitals Details Patient Name: Date of Service: [name] 06/30/2021 8:45 A M Medical Record Number: [id number] Patient Account Number: [id number] Date of Birth/Sex: Treating RN: Dec 24, 1960 (60 y.o. Charlean Merl, Lauren Primary Care Rohith Fauth: [name] Other Clinician: Referring Tekela Garguilo: Treating Jeorgia Helming/Extender: Avie Echevaria in Treatment: 3 Vital Signs Time Taken: 08:50 Temperature (F): 98.0 Height (in): 73 Pulse (bpm): 112 Weight (lbs): 270 Respiratory Rate (breaths/min): 17 Body Mass Index (BMI): 35.6 Blood Pressure (mmHg): 129/78 Reference Range: 80 - 120 mg / dl Electronic Signature(s) Signed: 06/30/2021 10:48:45 AM By: Karl Ito Entered By: Karl Ito on 06/30/2021 08:51:08

## 2021-07-03 ENCOUNTER — Encounter (HOSPITAL_BASED_OUTPATIENT_CLINIC_OR_DEPARTMENT_OTHER): Payer: Medicare Other | Admitting: Internal Medicine

## 2021-07-07 ENCOUNTER — Encounter (HOSPITAL_BASED_OUTPATIENT_CLINIC_OR_DEPARTMENT_OTHER): Payer: Medicare Other | Admitting: Internal Medicine

## 2021-07-07 ENCOUNTER — Other Ambulatory Visit: Payer: Self-pay

## 2021-07-07 DIAGNOSIS — E11621 Type 2 diabetes mellitus with foot ulcer: Secondary | ICD-10-CM | POA: Diagnosis not present

## 2021-07-07 LAB — GLUCOSE, CAPILLARY
Glucose-Capillary: 191 mg/dL — ABNORMAL HIGH (ref 70–99)
Glucose-Capillary: 152 mg/dL — ABNORMAL HIGH (ref 70–99)

## 2021-07-07 NOTE — Progress Notes (Signed)
ZIA, KANNER ([id number]) Visit Report for 07/07/2021 [name] Details [name] Name: Date of Service: AMEDEO, DETWEILER 07/07/2021 12:30 PM Medical Record Number: [id number] [name] Account Number: [id number] Date of Birth/Sex: Treating RN: 09/05/1961 (60 y.o. Charlean Merl, [name] Primary Care Provider: [name] Other Clinician: Referring Provider: Treating Provider/Extender: Avie Echevaria in Treatment: 4 [name] Performed for Assessment: Wound #3 Left,Plantar Foot Performed By: Physician Maxwell Caul., MD [name] Type: [name] Severity of Tissue Pre [name]: Fat layer exposed Level of Consciousness (Pre-procedure): Awake and Alert Pre-procedure Verification/Time Out Yes - 13:49 Taken: Start Time: 13:50 T Area Debrided (L x W): otal 1.8 (cm) x 2.7 (cm) = 4.86 (cm) Tissue and other material debrided: Non-Viable, Callus, Skin: Dermis Level: Skin/Dermis [name] Description: Selective/Open Wound Instrument: Curette Bleeding: Minimum Hemostasis Achieved: Pressure End Time: 13:54 Response to Treatment: Procedure was tolerated well Level of Consciousness (Post- Awake and Alert procedure): Post [name] Measurements of Total Wound Length: (cm) 1.8 Width: (cm) 2.7 Depth: (cm) 0.2 Volume: (cm) 0.763 Character of Wound/Ulcer Post [name]: Stable Severity of Tissue Post [name]: Fat layer exposed Post Procedure Diagnosis Same as Pre-procedure Electronic Signature(s) Signed: 07/07/2021 4:30:53 PM By: Baltazar Najjar MD Signed: 07/07/2021 6:09:57 PM By: Fonnie Mu RN Entered By: Baltazar Najjar on 07/07/2021 14:02:59 -------------------------------------------------------------------------------- HPI Details [name] Name: Date of Service: [name] IG 07/07/2021 12:30 PM Medical Record Number: [id number] [name] Account Number: [id number] Date of Birth/Sex: Treating RN: 10-02-1961 (60 y.o. [name] Primary Care Provider: [name] Other Clinician: Referring Provider: Treating Provider/Extender: Avie Echevaria in Treatment: 4 History of Present Illness HPI Description: 10/31/2019 upon evaluation today [name] appears to be doing somewhat poorly in regard to his bilateral plantar feet. He has wounds that he tells me have been present since 2012 intermittently off and on. Most recently this has been open for at least the past 6 months to a year. He has been trying to treat this in different ways using Santyl along with various other dressings including Medihoney and even at one point Xeroform. Nothing really has seem to get this completely closed. He was recently in the hospital for cellulitis of his leg subsequently he did have x-rays as well as MRIs that showed negative for any signs of osteomyelitis in regard to the wounds on his feet. Fortunately there is no signs of systemic infection at this time. No fevers, chills, nausea, vomiting, or diarrhea. [name] has previously used Darco offloading shoes as far as frontal floaters as well as postop surgical shoes. He has never been in a total contact cast that may be something we need to strongly consider here. [name]'s most recent hemoglobin A1c 1 month ago was 5.3 seems to be very well controlled which is great. Subsequently he has seen vascular as well as podiatry. His ABIs are 1.07 on the left and 1.14 on the right he seems to be doing well he does have chronic venous stasis. 11/07/2019 upon evaluation today [name] appears to be doing well with regard to his wounds all things considered. I do not see any severe worsening he still has some callus buildup on the right more than the left he notes that he has been probably more active than he should as far as walking is concerned is just very hard to not be active. He knows he needs to be more careful in this regard however. He is willing to give the cast a try at  this point although he notes that he is  a little nervous about this just with regard to balance although he will be very careful and obviously if he has any trouble he knows to contact the office and let me know. 1/22; [name] is in for his obligatory first total contact cast change. Our intake nurse reported a very large amount of drainage which is spelled out over to the surrounding skin. Has bilateral diabetic foot wounds. He has Charcot feet. We have been using silver alginate on his wounds. 11/14/2019 on evaluation today [name] is actually seeming to make good improvement in regard to his bilateral plantar foot wounds. We have been using a cast on the left side and on the right side he has been using dressings he is changing up his own accord. With that being said he tells me that he is also not walking as much just due to how unsteady he feels. He takes it easy when he does have to walk and when he does not have to walk he is resting. This is probably help in his right foot as well has the left foot which is actually measuring better. In fact both are measuring better. Overall I am very pleased with how things seem to be progressing. The [name] does have some odor on the left foot this does have me concerned about the possibility of infection, and actually probably go ahead and put him on antibiotics today as well as utilizing a continuation of the cast on the left foot I think that will be fine we probably just need to bring him in sooner to change this not last a whole week. 1/29; we brought the [name] back today for a total contact cast change on the left out of concern for excessive drainage. We are using drawtex over the wound as the primary dressing 11/21/2019 on evaluation today [name] appears to be doing well with regard to his left plantar foot. In fact both foot ulcers actually seem to be doing pretty well. Nonetheless he is having a lot of drainage on the left at this time and again  we did obtain a wound culture did show positive for Staph aureus that was reviewed by myself today as well. Nonetheless he is on Bactrim which was shown to be sensitive that should be helping in this regard. Fortunately there is no signs of infection systemically at this point. 2/5; back in clinic today for a total contact cast change apparently secondary to very significant drainage. Still using drawtex 11/28/2019 upon evaluation today [name] appears to be doing well with regard to his wounds. The right foot is doing okay as measured about the same in my opinion. The left foot is actually showing signs of significant improvement is measuring smaller there is a lot of hyper granulation likely due to the continued drainage at this point. We did obtain approval for a snap VAC I think that is good to be appropriate for him and will likely help this tremendously underneath the cast. He is definitely in agreement with proceeding with such. 2/12; [name] came in today after his snap VAC lost suction. Brought in to see one of our nurses. The dressing was replaced and then we put the cast back on and rehooked up the snap VAC. Apparently his wound looked very good per our intake nurse. 2/15; again we replaced the cast on Friday. By Saturday the snap VAC and light suction. He called this morning he comes in acutely. The wounds look fine however the VAC is not functioning. We  replaced the cast using silver alginate as the primary dressing backed with Kerramax. The snap VAC was not replaced 12/05/2019 upon evaluation today [name] appears to be doing better in regard to his left plantar foot ulcer. Fortunately there is no signs of active infection at this time. Unfortunately he is continuing to have issues with the right foot he is really not making any progress here things seem to be somewhat stagnant to be honest. The depth has increased but that is due to me having debrided the wound in the past based on what I  am seeing. 12/12/2019 on evaluation today [name] appears to be doing more poorly in regard to the left lower extremity. He has some erythema spreading up the side of his foot I am concerned about infection again at this point. Unfortunately he has been seeing improvement with a total contact cast but I do not think we should put that on today. On his right plantar foot he continues to have significant drainage this is actually measuring deeper I really do not feel like you are making any progress whatsoever. I have prescribed Granix for him unfortunately his insurance apparently was going to cost him a $500 co-pay. 12/19/2019 upon evaluation today [name] actually appears to be doing better in regard to both wounds. With that being said he actually did get the reGranix which he had to pay $500 for. With that being said it does look like that he is actually made some improvement based on what I am seeing at this point with the reGranix. Obviously if he is going to continue this we are going to do something about trying to get him some help in covering the cost. 12/26/2019 on evaluation today [name] appears to be doing really much better even compared to last week. Overall the wound seems to be much better even compared to last week and last week was better than the week before. Since has been using the reGranix his symptoms have improved significantly. With that being said the issue right now is simply that this is a very expensive medication for him the first dose cost him $500. Upon inspection [name]'s wound bed actually is however dramatically improved compared to before he started this 2 weeks ago. 01/02/2020 upon evaluation today [name] appears to actually be doing well. He still had a little bit of reGranix left that has been using in small amounts he just been applying it every other day instead of every day in order to make it go longer. Overall we are still seeing excellent improvement he is  measuring smaller looking better healthier tissue and everything seems to be pointing to this headed in the right direction. Fortunately there is no evidence of infection either which is also excellent news. He does have his MRI coming up within the next week. 01/09/2020 upon evaluation today [name] appears to be doing a little worse today compared to previous week's evaluation. He is actually been out of the reGranix at this point. He has been trying to make the stretch out so he has been changing the dressings on a regular set schedule like he was previous. I think this has made a difference. Fortunately there is no signs of active infection at this time. No fevers, chills, nausea, vomiting, or diarrhea. 01/16/2020 upon evaluation today [name] appears to be doing well with regard to his left plantar foot ulcer. The right plantar foot still shows some significant depth at this point. Fortunately there is no signs of active  infection at this time. 01/30/2020 upon evaluation today [name] appears to be doing about the same in regard to his right plantar foot ulcer there is still some depth here and we had to wait till he actually switched over to his new insurance to get approval for the MRI under his new insurance plan. With that being said he now has switched as of April 1. Fortunately there is no signs of active infection at this time. Overall in regard to his left foot ulcer this seems to be doing much better and I am actually very pleased with how things are going. With that being said it is not quite as much progress as we were seeing with the reGranix but at the same time he has had trouble getting this apparently there is been some hindrance here. I Ernie Hew try to actually send this to [facility] that was recommended by the drug rep to me. 02/13/20 upon evaluation today [name] appears to be doing better in regard to his left foot ulcer this is great news. Unfortunately the right foot ulcer is  not really significantly better at this time. There is no signs of active infection systemically though he did have his MRI which showed unfortunately he does have infection noted including an abscess in the foot. There is also marrow changes noted which are consistent with osteomyelitis based on the radiology review and interpretation. Unfortunately considering that the wound is not really making the progress that we will he would like to be seen I think that this is an indication that he may need some further referral both infectious disease as well as potentially to podiatrist to see if there is anything that can be done to help with the situation that were dealing with here. The left foot again is doing great. READMISSION 06/04/2021 This is a 60 year old man who was in the clinic in 2021 followed by Allen Derry for areas on the right and left foot. He developed a left foot infection and was referred to ID. He left the clinic in a nonhealed state and was followed for a period of [facility] Marylene Land. Apparently things really deteriorated in early July when he was admitted to hospital from 04/27/2021 through 05/06/2021 with sepsis secondary to a left foot infection. His blood cultures were negative. An MRI suggested fifth metatarsal osteomyelitis a left ankle septic joint. He was treated with vancomycin and ertapenem which he is still taking and may just about be finishing. He was seen by orthopedics and the [name] adamantly refused to BKA. As far as I can tell he did not have the ankle aspirated I am not exactly sure what the issue was here. Since he has been discharged she is at [facility] health care for rehabilitation. He was last seen by Dr. Algis Liming on 05/20/2021 he noted osteo of the tibial talar bone cuboid and fifth metatarsal which is even more extensive than what was suggested by the MRI. He is apparently going for a consultation with orthopedic surgery in [place] sometime  next week. I received a call about this man 2 weeks ago from Dr. Allyson Sabal who follows him for the possibility of PAD. ABIs I think done in the office showed a ABI on the right of 1.05 at the PTA and 0.99 at the PTA on the left. He had a DVT rule out in the left leg that was negative for the DVT. Past medical history is extensive and includes diastolic heart failure, right first  metatarsal head ulcer in 2021, excision of the right second ray by Dr. Marylene Land on 03/13/2020, hypertension, hypothyroidism. Left total hip replacement, right total knee replacement, carpal tunnel syndrome, obstructive sleep apnea alcohol abuse with cirrhosis although the [name] denies current alcohol intake. The [name] does not think he is a diabetic however looking through [facility] link I see 2 HgbAic's of this year that were greater than equal to 6.5 which by definition makes him diabetic. Nevertheless he is not on any treatment and does not check his blood sugars. The [name] is now back home out of the nursing home. Saw Dr. Algis Liming last week he was taken off vancomycin and ertapenem on August 22 and now is on doxycycline on Augmentin. He also saw Dr. Weston Anna who is his orthopedic surgeon in [place] he recommended a [employer]	[place]. He has been using Medihoney. 9/6I have been having trouble getting hyperbarics approved through our prior authorization process. Even though he had a limb threatening infection in the left foot and probably the left ankle there glitches in how some of the reports are worded also some of the consultants. In any case I am going to repeat his sedimentation rate and C-reactive protein. I am generally not in favor of doing things like this as they really do not alter the plan of care from my point of view however I am going to need to demonstrate that these remain high in order to get this through forhyperbaric treatment for chronic refractory osteomyelitis 9/13; following this man for a wound on his left  plantar foot in the setting of type 2 diabetes and Charcot deformity. He has underlying chronic refractory osteomyelitis. Follow-up sedimentation rate and C-reactive protein were both elevated but the C-reactive protein was down to 1.4, sedimentation rate at 70. Sedimentation rate was only slightly down from previous at 85. His wound is measuring slightly smaller. 9/20; [name] started hyperbaric oxygen therapy today and tolerated treatment well. This is for the underlying osteomyelitis. He remains on antibiotics but thinks he is getting close to finishing. The wound on the plantar aspect of his foot is the other issue we are following here. He is using Medihoney The [name] has a Charcot foot in the setting of type 2 diabetes. He is going to need a total contact cast although his partner was away this week and we elected to delay this till next week Electronic Signature(s) Signed: 07/07/2021 4:30:53 PM By: Baltazar Najjar MD Entered By: Baltazar Najjar on 07/07/2021 14:05:10 -------------------------------------------------------------------------------- Physical Exam Details [name] Name: Date of Service: [name], [name] IG 07/07/2021 12:30 PM Medical Record Number: [id number] [name] Account Number: [id number] Date of Birth/Sex: Treating RN: 11-25-1960 (60 y.o. [name] Primary Care Provider: [name] Other Clinician: Referring Provider: Treating Provider/Extender: Avie Echevaria in Treatment: 4 Constitutional Sitting or standing Blood Pressure is within target range for [name].. Pulse regular and within target range for [name].Marland Kitchen Respirations regular, non-labored and within target range.. Temperature is normal and within the target range for the [name].Marland Kitchen Appears in no distress. Notes Wound exam; mid part of the left plantar foot. I removed thick skin and callus from around the wound circumference. There was no bleeding he has beginnings of  epithelialization. Surface of the wound under illumination look quite good. No evidence of surrounding infection. He has a notable Charcot deformity Electronic Signature(s) Signed: 07/07/2021 4:30:53 PM By: Baltazar Najjar MD Entered By: Baltazar Najjar on 07/07/2021 14:05:57 -------------------------------------------------------------------------------- Physician Orders Details [name] Name: Date of Service: Hyacinth Meeker,  [name] 07/07/2021 12:30 PM Medical Record Number: [id number] [name] Account Number: [id number] Date of Birth/Sex: Treating RN: 28-Mar-1961 (60 y.o. [name] Primary Care Provider: [name] Other Clinician: Referring Provider: Treating Provider/Extender: Avie Echevaria in Treatment: 4 Verbal / Phone Orders: No Diagnosis Coding ICD-10 Coding Code Description E11.621 Type 2 diabetes mellitus with foot ulcer M14.672 Charcot's joint, left ankle and foot L97.528 Non-pressure chronic ulcer of other part of left foot with other specified severity M86.372 Chronic multifocal osteomyelitis, left ankle and foot Follow-up Appointments ppointment in 1 week. - Tuesday with Dr. Leanord Hawking Return A Friday (07/10/21) for cast change Bathing/ Shower/ Hygiene May shower and wash wound with soap and water. - prior to dressing change Edema Control - Lymphedema / SCD / Other Elevate legs to the level of the heart or above for 30 minutes daily and/or when sitting, a frequency of: - throughout the day Avoid standing for long periods of time. Exercise regularly Moisturize legs daily. Additional Orders / Instructions Follow Nutritious Diet Hyperbaric Oxygen Therapy Wound #3 Left,Plantar Foot Evaluate for HBO Therapy - Starts 07/07/21 Indication: - Chronic Refractory Osteomyelitis 2.5 ATA for 90 Minutes with 2 Five (5) Minute A Breaks ir Total Number of Treatments: - 40 One treatments per day (delivered Monday through Friday unless otherwise specified in Special  Instructions below): Finger stick Blood Glucose Pre- and Post- HBOT Treatment. Follow Hyperbaric Oxygen Glycemia Protocol Afrin (Oxymetazoline HCL) 0.05% nasal spray - 1 spray in both nostrils daily as needed prior to HBO treatment for difficulty clearing ears Wound Treatment Wound #3 - Foot Wound Laterality: Plantar, Left Cleanser: Soap and Water 1 x Per Day Discharge Instructions: May shower and wash wound with dial antibacterial soap and water prior to dressing change. Cleanser: Wound Cleanser 1 x Per Day Discharge Instructions: Cleanse the wound with wound cleanser prior to applying a clean dressing using gauze sponges, not tissue or cotton balls. Prim Dressing: MediHoney Calcium Alginate Dressing 4x5 in 1 x Per Day ary Discharge Instructions: Apply to wound bed as instructed Secondary Dressing: Woven Gauze Sponge, Non-Sterile 4x4 in 1 x Per Day Discharge Instructions: Apply over primary dressing as directed. Secondary Dressing: ABD Pad, 5x9 1 x Per Day Discharge Instructions: Apply over primary dressing as directed. Secured With: [employer], 4.5x3.1 (in/yd) 1 x Per Day Discharge Instructions: Secure with Kerlix as directed. Secured With: 47M Medipore H Soft Cloth Surgical Tape, 2x2 (in/yd) 1 x Per Day Discharge Instructions: Secure dressing with tape as directed. GLYCEMIA INTERVENTIONS PROTOCOL PRE-HBO GLYCEMIA INTERVENTIONS ACTION INTERVENTION Obtain pre-HBO capillary blood glucose (ensure 1 physician order is in chart). A. Notify HBO physician and await physician orders. 2 If result is 70 mg/dl or below: B. If the result meets the hospital definition of a critical result, follow hospital policy. A. Give [name] an 8 ounce Glucerna Shake, an 8 ounce Ensure, or 8 ounces of a Glucerna/Ensure equivalent dietary supplement*. B. Wait 30 minutes. If result is 71 mg/dl to 144 mg/dl: C. Retest patients capillary blood glucose (CBG). D. If result greater than or equal  to 110 mg/dl, proceed with HBO. If result less than 110 mg/dl, notify HBO physician and consider holding HBO. If result is 131 mg/dl to 315 mg/dl: A. Proceed with HBO. A. Notify HBO physician and await physician orders. B. It is recommended to hold HBO and do If result is 250 mg/dl or greater: blood/urine ketone testing. C. If the result meets the hospital definition of a critical result,  follow hospital policy. POST-HBO GLYCEMIA INTERVENTIONS ACTION INTERVENTION Obtain post HBO capillary blood glucose (ensure 1 physician order is in chart). A. Notify HBO physician and await physician orders. 2 If result is 70 mg/dl or below: B. If the result meets the hospital definition of a critical result, follow hospital policy. A. Give [name] an 8 ounce Glucerna Shake, an 8 ounce Ensure, or 8 ounces of a Glucerna/Ensure equivalent dietary supplement*. B. Wait 15 minutes for symptoms of If result is 71 mg/dl to 161 mg/dl: hypoglycemia (i.e. nervousness, anxiety, sweating, chills, clamminess, irritability, confusion, tachycardia or dizziness). C. If [name] asymptomatic, discharge [name]. If [name] symptomatic, repeat capillary blood glucose (CBG) and notify HBO physician. If result is 101 mg/dl to 096 mg/dl: A. Discharge [name]. A. Notify HBO physician and await physician orders. B. It is recommended to do blood/urine ketone If result is 250 mg/dl or greater: testing. C. If the result meets the hospital definition of a critical result, follow hospital policy. *Juice or candies are NOT equivalent products. If [name] refuses the Glucerna or Ensure, please consult the hospital dietitian for an appropriate substitute. Electronic Signature(s) Signed: 07/07/2021 4:30:53 PM By: Baltazar Najjar MD Signed: 07/07/2021 5:09:48 PM By: [name] Entered By: [name] on 07/07/2021 13:56:43 -------------------------------------------------------------------------------- Problem  List Details [name] Name: Date of Service: [name], [name] IG 07/07/2021 12:30 PM Medical Record Number: [id number] [name] Account Number: [id number] Date of Birth/Sex: Treating RN: 1961-07-21 (60 y.o. [name] Primary Care Provider: [name] Other Clinician: Referring Provider: Treating Provider/Extender: Avie Echevaria in Treatment: 4 Active Problems ICD-10 Encounter Code Description Active Date MDM Diagnosis E11.621 Type 2 diabetes mellitus with foot ulcer 06/04/2021 No Yes M14.672 Charcot's joint, left ankle and foot 06/04/2021 No Yes L97.528 Non-pressure chronic ulcer of other part of left foot with other specified 06/04/2021 No Yes severity M86.372 Chronic multifocal osteomyelitis, left ankle and foot 06/04/2021 No Yes Inactive Problems Resolved Problems Electronic Signature(s) Signed: 07/07/2021 4:30:53 PM By: Baltazar Najjar MD Entered By: Baltazar Najjar on 07/07/2021 14:02:40 -------------------------------------------------------------------------------- Progress Note Details [name] Name: Date of Service: [name] IG 07/07/2021 12:30 PM Medical Record Number: [id number] [name] Account Number: [id number] Date of Birth/Sex: Treating RN: [birth year] (60 y.o. [name] Primary Care Provider: [name] Other Clinician: Referring Provider: Treating Provider/Extender: Avie Echevaria in Treatment: 4 Subjective History of Present Illness (HPI) 10/31/2019 upon evaluation today [name] appears to be doing somewhat poorly in regard to his bilateral plantar feet. He has wounds that he tells me have been present since 2012 intermittently off and on. Most recently this has been open for at least the past 6 months to a year. He has been trying to treat this in different ways using Santyl along with various other dressings including Medihoney and even at one point Xeroform. Nothing really has seem to get this completely  closed. He was recently in the hospital for cellulitis of his leg subsequently he did have x-rays as well as MRIs that showed negative for any signs of osteomyelitis in regard to the wounds on his feet. Fortunately there is no signs of systemic infection at this time. No fevers, chills, nausea, vomiting, or diarrhea. [name] has previously used Darco offloading shoes as far as frontal floaters as well as postop surgical shoes. He has never been in a total contact cast that may be something we need to strongly consider here. [name]'s most recent hemoglobin A1c 1 month ago was 5.3 seems to be very well controlled  which is great. Subsequently he has seen vascular as well as podiatry. His ABIs are 1.07 on the left and 1.14 on the right he seems to be doing well he does have chronic venous stasis. 11/07/2019 upon evaluation today [name] appears to be doing well with regard to his wounds all things considered. I do not see any severe worsening he still has some callus buildup on the right more than the left he notes that he has been probably more active than he should as far as walking is concerned is just very hard to not be active. He knows he needs to be more careful in this regard however. He is willing to give the cast a try at this point although he notes that he is a little nervous about this just with regard to balance although he will be very careful and obviously if he has any trouble he knows to contact the office and let me know. 1/22; [name] is in for his obligatory first total contact cast change. Our intake nurse reported a very large amount of drainage which is spelled out over to the surrounding skin. Has bilateral diabetic foot wounds. He has Charcot feet. We have been using silver alginate on his wounds. 11/14/2019 on evaluation today [name] is actually seeming to make good improvement in regard to his bilateral plantar foot wounds. We have been using a cast on the left side and on the  right side he has been using dressings he is changing up his own accord. With that being said he tells me that he is also not walking as much just due to how unsteady he feels. He takes it easy when he does have to walk and when he does not have to walk he is resting. This is probably help in his right foot as well has the left foot which is actually measuring better. In fact both are measuring better. Overall I am very pleased with how things seem to be progressing. The [name] does have some odor on the left foot this does have me concerned about the possibility of infection, and actually probably go ahead and put him on antibiotics today as well as utilizing a continuation of the cast on the left foot I think that will be fine we probably just need to bring him in sooner to change this not last a whole week. 1/29; we brought the [name] back today for a total contact cast change on the left out of concern for excessive drainage. We are using drawtex over the wound as the primary dressing 11/21/2019 on evaluation today [name] appears to be doing well with regard to his left plantar foot. In fact both foot ulcers actually seem to be doing pretty well. Nonetheless he is having a lot of drainage on the left at this time and again we did obtain a wound culture did show positive for Staph aureus that was reviewed by myself today as well. Nonetheless he is on Bactrim which was shown to be sensitive that should be helping in this regard. Fortunately there is no signs of infection systemically at this point. 2/5; back in clinic today for a total contact cast change apparently secondary to very significant drainage. Still using drawtex 11/28/2019 upon evaluation today [name] appears to be doing well with regard to his wounds. The right foot is doing okay as measured about the same in my opinion. The left foot is actually showing signs of significant improvement is measuring smaller there is a lot  of hyper  granulation likely due to the continued drainage at this point. We did obtain approval for a snap VAC I think that is good to be appropriate for him and will likely help this tremendously underneath the cast. He is definitely in agreement with proceeding with such. 2/12; [name] came in today after his snap VAC lost suction. Brought in to see one of our nurses. The dressing was replaced and then we put the cast back on and rehooked up the snap VAC. Apparently his wound looked very good per our intake nurse. 2/15; again we replaced the cast on Friday. By Saturday the snap VAC and light suction. He called this morning he comes in acutely. The wounds look fine however the VAC is not functioning. We replaced the cast using silver alginate as the primary dressing backed with Kerramax. The snap VAC was not replaced 12/05/2019 upon evaluation today [name] appears to be doing better in regard to his left plantar foot ulcer. Fortunately there is no signs of active infection at this time. Unfortunately he is continuing to have issues with the right foot he is really not making any progress here things seem to be somewhat stagnant to be honest. The depth has increased but that is due to me having debrided the wound in the past based on what I am seeing. 12/12/2019 on evaluation today [name] appears to be doing more poorly in regard to the left lower extremity. He has some erythema spreading up the side of his foot I am concerned about infection again at this point. Unfortunately he has been seeing improvement with a total contact cast but I do not think we should put that on today. On his right plantar foot he continues to have significant drainage this is actually measuring deeper I really do not feel like you are making any progress whatsoever. I have prescribed Granix for him unfortunately his insurance apparently was going to cost him a $500 co-pay. 12/19/2019 upon evaluation today [name] actually appears to  be doing better in regard to both wounds. With that being said he actually did get the reGranix which he had to pay $500 for. With that being said it does look like that he is actually made some improvement based on what I am seeing at this point with the reGranix. Obviously if he is going to continue this we are going to do something about trying to get him some help in covering the cost. 12/26/2019 on evaluation today [name] appears to be doing really much better even compared to last week. Overall the wound seems to be much better even compared to last week and last week was better than the week before. Since has been using the reGranix his symptoms have improved significantly. With that being said the issue right now is simply that this is a very expensive medication for him the first dose cost him $500. Upon inspection [name]'s wound bed actually is however dramatically improved compared to before he started this 2 weeks ago. 01/02/2020 upon evaluation today [name] appears to actually be doing well. He still had a little bit of reGranix left that has been using in small amounts he just been applying it every other day instead of every day in order to make it go longer. Overall we are still seeing excellent improvement he is measuring smaller looking better healthier tissue and everything seems to be pointing to this headed in the right direction. Fortunately there is no evidence of infection either which is  also excellent news. He does have his MRI coming up within the next week. 01/09/2020 upon evaluation today [name] appears to be doing a little worse today compared to previous week's evaluation. He is actually been out of the reGranix at this point. He has been trying to make the stretch out so he has been changing the dressings on a regular set schedule like he was previous. I think this has made a difference. Fortunately there is no signs of active infection at this time. No fevers, chills,  nausea, vomiting, or diarrhea. 01/16/2020 upon evaluation today [name] appears to be doing well with regard to his left plantar foot ulcer. The right plantar foot still shows some significant depth at this point. Fortunately there is no signs of active infection at this time. 01/30/2020 upon evaluation today [name] appears to be doing about the same in regard to his right plantar foot ulcer there is still some depth here and we had to wait till he actually switched over to his new insurance to get approval for the MRI under his new insurance plan. With that being said he now has switched as of April 1. Fortunately there is no signs of active infection at this time. Overall in regard to his left foot ulcer this seems to be doing much better and I am actually very pleased with how things are going. With that being said it is not quite as much progress as we were seeing with the reGranix but at the same time he has had trouble getting this apparently there is been some hindrance here. I Ernie Hew try to actually send this to [facility] that was recommended by the drug rep to me. 02/13/20 upon evaluation today [name] appears to be doing better in regard to his left foot ulcer this is great news. Unfortunately the right foot ulcer is not really significantly better at this time. There is no signs of active infection systemically though he did have his MRI which showed unfortunately he does have infection noted including an abscess in the foot. There is also marrow changes noted which are consistent with osteomyelitis based on the radiology review and interpretation. Unfortunately considering that the wound is not really making the progress that we will he would like to be seen I think that this is an indication that he may need some further referral both infectious disease as well as potentially to podiatrist to see if there is anything that can be done to help with the situation that were dealing with  here. The left foot again is doing great. READMISSION 06/04/2021 This is a 60 year old man who was in the clinic in 2021 followed by Allen Derry for areas on the right and left foot. He developed a left foot infection and was referred to ID. He left the clinic in a nonhealed state and was followed for a period of [facility] Marylene Land. Apparently things really deteriorated in early July when he was admitted to hospital from 04/27/2021 through 05/06/2021 with sepsis secondary to a left foot infection. His blood cultures were negative. An MRI suggested fifth metatarsal osteomyelitis a left ankle septic joint. He was treated with vancomycin and ertapenem which he is still taking and may just about be finishing. He was seen by orthopedics and the [name] adamantly refused to BKA. As far as I can tell he did not have the ankle aspirated I am not exactly sure what the issue was here. Since he has been discharged she  is at [facility] health care for rehabilitation. He was last seen by Dr. Algis Liming on 05/20/2021 he noted osteo of the tibial talar bone cuboid and fifth metatarsal which is even more extensive than what was suggested by the MRI. He is apparently going for a consultation with orthopedic surgery in [place] sometime next week. I received a call about this man 2 weeks ago from Dr. Allyson Sabal who follows him for the possibility of PAD. ABIs I think done in the office showed a ABI on the right of 1.05 at the PTA and 0.99 at the PTA on the left. He had a DVT rule out in the left leg that was negative for the DVT. Past medical history is extensive and includes diastolic heart failure, right first metatarsal head ulcer in 2021, excision of the right second ray by Dr. Marylene Land on 03/13/2020, hypertension, hypothyroidism. Left total hip replacement, right total knee replacement, carpal tunnel syndrome, obstructive sleep apnea alcohol abuse with cirrhosis although the [name] denies current alcohol  intake. The [name] does not think he is a diabetic however looking through [place] link I see 2 HgbAic's of this year that were greater than equal to 6.5 which by definition makes him diabetic. Nevertheless he is not on any treatment and does not check his blood sugars. The [name] is now back home out of the nursing home. Saw Dr. Algis Liming last week he was taken off vancomycin and ertapenem on August 22 and now is on doxycycline on Augmentin. He also saw Dr. Weston Anna who is his orthopedic surgeon in [place] he recommended a [employer]	[place]. He has been using Medihoney. 9/6I have been having trouble getting hyperbarics approved through our prior authorization process. Even though he had a limb threatening infection in the left foot and probably the left ankle there glitches in how some of the reports are worded also some of the consultants. In any case I am going to repeat his sedimentation rate and C-reactive protein. I am generally not in favor of doing things like this as they really do not alter the plan of care from my point of view however I am going to need to demonstrate that these remain high in order to get this through forhyperbaric treatment for chronic refractory osteomyelitis 9/13; following this man for a wound on his left plantar foot in the setting of type 2 diabetes and Charcot deformity. He has underlying chronic refractory osteomyelitis. Follow-up sedimentation rate and C-reactive protein were both elevated but the C-reactive protein was down to 1.4, sedimentation rate at 70. Sedimentation rate was only slightly down from previous at 85. His wound is measuring slightly smaller. 9/20; [name] started hyperbaric oxygen therapy today and tolerated treatment well. This is for the underlying osteomyelitis. He remains on antibiotics but thinks he is getting close to finishing. The wound on the plantar aspect of his foot is the other issue we are following here. He is using Medihoney The  [name] has a Charcot foot in the setting of type 2 diabetes. He is going to need a total contact cast although his partner was away this week and we elected to delay this till next week Objective Constitutional Sitting or standing Blood Pressure is within target range for [name].. Pulse regular and within target range for [name].Marland Kitchen Respirations regular, non-labored and within target range.. Temperature is normal and within the target range for the [name].Marland Kitchen Appears in no distress. Vitals Time Taken: 1:19 PM, Height: 73 in, Weight: 270 lbs, BMI: 35.6, Temperature: 97.9 F,  Pulse: 82 bpm, Respiratory Rate: 20 breaths/min, Blood Pressure: 112/71 mmHg, Capillary Blood Glucose: 152 mg/dl. General Notes: Wound exam; mid part of the left plantar foot. I removed thick skin and callus from around the wound circumference. There was no bleeding he has beginnings of epithelialization. Surface of the wound under illumination look quite good. No evidence of surrounding infection. He has a notable Charcot deformity Integumentary (Hair, Skin) Wound #3 status is Open. Original cause of wound was Gradually Appeared. The date acquired was: 03/18/2021. The wound has been in treatment 4 weeks. The wound is located on the Left,Plantar Foot. The wound measures 1.8cm length x 2.7cm width x 0.2cm depth; 3.817cm^2 area and 0.763cm^3 volume. There is Fat Layer (Subcutaneous Tissue) exposed. There is no tunneling or undermining noted. There is a medium amount of serosanguineous drainage noted. The wound margin is distinct with the outline attached to the wound base. There is large (67-100%) red granulation within the wound bed. There is no necrotic tissue within the wound bed. Assessment Active Problems ICD-10 Type 2 diabetes mellitus with foot ulcer Charcot's joint, left ankle and foot Non-pressure chronic ulcer of other part of left foot with other specified severity Chronic multifocal osteomyelitis, left ankle and  foot Procedures Wound #3 Pre-procedure diagnosis of Wound #3 is a Diabetic Wound/Ulcer of the Lower Extremity located on the Left,Plantar Foot .Severity of Tissue Pre [name] is: Fat layer exposed. There was a Selective/Open Wound Skin/Dermis [name] with a total area of 4.86 sq cm performed by Maxwell Caul., MD. With the following instrument(s): Curette to remove Non-Viable tissue/material. Material removed includes Callus and Skin: Dermis and. No specimens were taken. A time out was conducted at 13:49, prior to the start of the procedure. A Minimum amount of bleeding was controlled with Pressure. The procedure was tolerated well. Post [name] Measurements: 1.8cm length x 2.7cm width x 0.2cm depth; 0.763cm^3 volume. Character of Wound/Ulcer Post [name] is stable. Severity of Tissue Post [name] is: Fat layer exposed. Post procedure Diagnosis Wound #3: Same as Pre-Procedure Plan Follow-up Appointments: Return Appointment in 1 week. - Tuesday with Dr. Leanord Hawking Friday (07/10/21) for cast change Bathing/ Shower/ Hygiene: May shower and wash wound with soap and water. - prior to dressing change Edema Control - Lymphedema / SCD / Other: Elevate legs to the level of the heart or above for 30 minutes daily and/or when sitting, a frequency of: - throughout the day Avoid standing for long periods of time. Exercise regularly Moisturize legs daily. Additional Orders / Instructions: Follow Nutritious Diet Hyperbaric Oxygen Therapy: Wound #3 Left,Plantar Foot: Evaluate for HBO Therapy - Starts 07/07/21 Indication: - Chronic Refractory Osteomyelitis 2.5 ATA for 90 Minutes with 2 Five (5) Minute Air Breaks T Number of Treatments: - 40 otal One treatments per day (delivered Monday through Friday unless otherwise specified in Special Instructions below): Finger stick Blood Glucose Pre- and Post- HBOT Treatment. Follow Hyperbaric Oxygen Glycemia Protocol Afrin (Oxymetazoline HCL)  0.05% nasal spray - 1 spray in both nostrils daily as needed prior to HBO treatment for difficulty clearing ears WOUND #3: - Foot Wound Laterality: Plantar, Left Cleanser: Soap and Water 1 x Per Day/ Discharge Instructions: May shower and wash wound with dial antibacterial soap and water prior to dressing change. Cleanser: Wound Cleanser 1 x Per Day/ Discharge Instructions: Cleanse the wound with wound cleanser prior to applying a clean dressing using gauze sponges, not tissue or cotton balls. Prim Dressing: MediHoney Calcium Alginate Dressing 4x5 in 1 x Per Day/  ary Discharge Instructions: Apply to wound bed as instructed Secondary Dressing: Woven Gauze Sponge, Non-Sterile 4x4 in 1 x Per Day/ Discharge Instructions: Apply over primary dressing as directed. Secondary Dressing: ABD Pad, 5x9 1 x Per Day/ Discharge Instructions: Apply over primary dressing as directed. Secured With: [employer], 4.5x3.1 (in/yd) 1 x Per Day/ Discharge Instructions: Secure with Kerlix as directed. Secured With: 24M Medipore H Soft Cloth Surgical T ape, 2x2 (in/yd) 1 x Per Day/ Discharge Instructions: Secure dressing with tape as directed. 1. I am continuing with the Medihoney although I will probably change to [facility] when we put a total contact cast on next week. The surface of his wound looks a lot better than when I saw this last 2. I removed debris from around the wound circumference. After doing this there was some epithelialization. Electronic Signature(s) Signed: 07/07/2021 4:30:53 PM By: Baltazar Najjar MD Entered By: Baltazar Najjar on 07/07/2021 14:06:51 -------------------------------------------------------------------------------- SuperBill Details [name] Name: Date of Service: KENZIE, FLAKES IG 07/07/2021 Medical Record Number: [id number] [name] Account Number: [id number] Date of Birth/Sex: Treating RN: [birth year] (60 y.o. [name] Primary Care Provider: [name] Other  Clinician: Referring Provider: Treating Provider/Extender: Avie Echevaria in Treatment: 4 Diagnosis Coding ICD-10 Codes Code Description [phone] Type 2 diabetes mellitus with foot ulcer M14.672 Charcot's joint, left ankle and foot L97.528 Non-pressure chronic ulcer of other part of left foot with other specified severity M86.372 Chronic multifocal osteomyelitis, left ankle and foot Facility Procedures CPT4 Code: [id number] Description: [phone] - DEBRIDE WOUND 1ST 20 SQ CM OR < ICD-10 Diagnosis Description L97.528 Non-pressure chronic ulcer of other part of left foot with other specified severi Modifier: ty Quantity: 1 Physician Procedures : CPT4 Code Description Modifier [id number] - WC PHYS DEBR WO ANESTH 20 SQ CM ICD-10 Diagnosis Description L97.528 Non-pressure chronic ulcer of other part of left foot with other specified severity Quantity: 1 Electronic Signature(s) Signed: 07/07/2021 4:30:53 PM By: Baltazar Najjar MD Entered By: Baltazar Najjar on 07/07/2021 14:07:01

## 2021-07-07 NOTE — Progress Notes (Signed)
[name], [name] ([id number]) Visit Report for 07/07/2021 SuperBill Details Patient Name: Date of Service: [name], [name] 07/07/2021 Medical Record Number: [id number] Patient Account Number: [id number] Date of Birth/Sex: Treating RN: [birth year] (60 y.o. Charlean Merl, Lauren Primary Care Provider: [name] Other Clinician: Haywood Pao Referring Provider: Treating Provider/Extender: Avie Echevaria in Treatment: 4 Diagnosis Coding ICD-10 Codes Code Description [phone] Type 2 diabetes mellitus with foot ulcer M14.672 Charcot's joint, left ankle and foot L97.528 Non-pressure chronic ulcer of other part of left foot with other specified severity M86.372 Chronic multifocal osteomyelitis, left ankle and foot Facility Procedures CPT4 Code Description Modifier Quantity [id number]-(Facility Use Only) HBOT full body chamber, , 5 ICD-10 Diagnosis Description E11.621 Type 2 diabetes mellitus with foot ulcer M14.672 Charcot's joint, left ankle and foot L97.528 Non-pressure chronic ulcer of other part of left foot with other specified severity M86.372 Chronic multifocal osteomyelitis, left ankle and foot Physician Procedures Quantity CPT4 Code Description Modifier [id number] - WC PHYS HYPERBARIC OXYGEN THERAPY 1 ICD-10 Diagnosis Description E11.621 Type 2 diabetes mellitus with foot ulcer M14.672 Charcot's joint, left ankle and foot L97.528 Non-pressure chronic ulcer of other part of left foot with other specified severity M86.372 Chronic multifocal osteomyelitis, left ankle and foot Electronic Signature(s) Signed: 07/07/2021 2:00:59 PM By: Haywood Pao EMT Signed: 07/07/2021 4:30:53 PM By: Baltazar Najjar MD Entered By: Haywood Pao on 07/07/2021 13:45:15

## 2021-07-07 NOTE — Progress Notes (Signed)
MORITZ, LEVER ([id number]) Visit Report for 07/07/2021 Arrival Information Details Patient Name: Date of Service: [name], [name] 07/07/2021 10:00 A M Medical Record Number: [id number] Patient Account Number: [id number] Date of Birth/Sex: Treating RN: 12/31/1960 (60 y.o. [name], [name] Primary Care Amari Burnsworth: [name] Other Clinician: Haywood Pao Referring Eimy Plaza: Treating Starr Urias/Extender: Avie Echevaria in Treatment: 4 Visit Information History Since Last Visit All ordered tests and consults were completed: Yes Patient Arrived: [name] Added or deleted any medications: No Arrival Time: 10:09 Any new allergies or adverse reactions: No Accompanied By: self Had a fall or experienced change in No Transfer Assistance: None activities of daily living that may affect Patient Identification Verified: Yes risk of falls: Secondary Verification Process Completed: Yes Signs or symptoms of abuse/neglect since last visito No Patient Requires Transmission-Based Precautions: No Hospitalized since last visit: No Patient Has Alerts: Yes Implantable device outside of the clinic excluding No Patient Alerts: L ABI: 0.99 cellular tissue based products placed in the center R ABI: 1.05 since last visit: Has Dressing in Place as Prescribed: Yes Has Compression in Place as Prescribed: Yes Pain Present Now: No Electronic Signature(s) Signed: 07/07/2021 2:00:59 PM By: Haywood Pao EMT Entered By: Haywood Pao on 07/07/2021 11:49:33 -------------------------------------------------------------------------------- Encounter Discharge Information Details Patient Name: Date of Service: [name] 07/07/2021 10:00 A M Medical Record Number: [id number] Patient Account Number: [id number] Date of Birth/Sex: Treating RN: Oct 18, 1961 (60 y.o. [name] Primary Care Tanvir Hipple: [name] Other Clinician: Haywood Pao Referring Edd Reppert: Treating  Krysti Hickling/Extender: Avie Echevaria in Treatment: 4 Encounter Discharge Information Items Discharge Condition: Stable Ambulatory Status: Walker Discharge Destination: Other (Note Required) Transportation: Other Accompanied By: self Schedule Follow-up Appointment: No Clinical Summary of Care: Notes Wound Care Encounter in clinic after treatment. Electronic Signature(s) Signed: 07/07/2021 2:00:59 PM By: Haywood Pao EMT Entered By: Haywood Pao on 07/07/2021 13:57:46 -------------------------------------------------------------------------------- Patient/Caregiver Education Details Patient Name: Date of Service: [name], [name] 9/20/2022andnbsp10:00 A M Medical Record Number: [id number] Patient Account Number: [id number] Date of Birth/Gender: Treating RN: 05-26-1961 (60 y.o. [name] Primary Care Physician: [name] Other Clinician: Haywood Pao Referring Physician: Treating Physician/Extender: Avie Echevaria in Treatment: 4 Education Assessment Education Provided To: Patient Education Topics Provided Hyperbaric Oxygenation: Handouts: Hyperbaric Oxygen Methods: Explain/Verbal Responses: Return demonstration correctly Electronic Signature(s) Signed: 07/07/2021 2:00:59 PM By: Haywood Pao EMT Entered By: Haywood Pao on 07/07/2021 13:46:54 -------------------------------------------------------------------------------- Vitals Details Patient Name: Date of Service: [name] 07/07/2021 10:00 A M Medical Record Number: [id number] Patient Account Number: [id number] Date of Birth/Sex: Treating RN: 06-23-1961 (60 y.o. [name], [name] Primary Care Tacey Dimaggio: [name] Other Clinician: Haywood Pao Referring Mishka Stegemann: Treating Findlay Dagher/Extender: Avie Echevaria in Treatment: 4 Vital Signs Time Taken: 10:40 Temperature (F): 98.3 Height (in): 73 Pulse (bpm):  105 Weight (lbs): 270 Respiratory Rate (breaths/min): 22 Body Mass Index (BMI): 35.6 Blood Pressure (mmHg): 106/73 Capillary Blood Glucose (mg/dl): 109 Reference Range: 80 - 120 mg / dl Electronic Signature(s) Signed: 07/07/2021 2:00:59 PM By: Haywood Pao EMT Entered By: Haywood Pao on 07/07/2021 11:53:52

## 2021-07-07 NOTE — Progress Notes (Signed)
[name], [name] ([id number]) Visit Report for 07/07/2021 HBO Details Patient Name: Date of Service: [name], [name] 07/07/2021 10:00 A M Medical Record Number: [id number] Patient Account Number: [id number] Date of Birth/Sex: Treating RN: 1961-02-10 (60 y.o. Charlean Merl, Lauren Primary Care Jaeger Trueheart: [name] Other Clinician: Haywood Pao Referring Rohit Deloria: Treating Iziah Cates/Extender: Avie Echevaria in Treatment: 4 HBO Treatment Course Details Treatment Course Number: 1 Ordering Cleophas Yoak: Linard Millers Treatments Ordered: otal 40 HBO Treatment Start Date: 07/07/2021 HBO Indication: Chronic Refractory Osteomyelitis to Left,Plantar Foot HBO Treatment Details Treatment Number: 1 Patient Type: Outpatient Chamber Type: Monoplace Chamber Serial #: [id number] Treatment Protocol: 2.5 ATA with 90 minutes oxygen, with two 5 minute air breaks Treatment Details Compression Rate Down: 1.0 psi / minute De-Compression Rate Up: 1.0 psi / minute A breaks and breathing ir Compress Tx Pressure periods Decompress Decompress Begins Reached (leave unused spaces Begins Ends blank) Chamber Pressure (ATA 1 2.5 2.5 2.5 2.5 2.5 - - 2.5 1 ) Clock Time (24 hr) 10:53 11:15 11:45 11:50 12:20 12:25 - - 12:55 13:15 Treatment Length: 142 (minutes) Treatment Segments: 5 Vital Signs Capillary Blood Glucose Reference Range: 80 - 120 mg / dl HBO Diabetic Blood Glucose Intervention Range: <131 mg/dl or >782 mg/dl Time Vitals Blood Respiratory Capillary Blood Glucose Pulse Action Type: Pulse: Temperature: Taken: Pressure: Rate: Glucose (mg/dl): Meter #: Oximetry (%) Taken: Pre 10:40 106/73 105 22 98.3 191 Post 13:17 112/71 82 20 97.9 152 Treatment Response Treatment Toleration: Well Treatment Completion Status: Treatment Completed without Adverse Event Kelcy Laible Notes Patient's first hyperbaric treatment. On exam showed normal tympanic membranes although somewhat obscured by  cerumen. Respiratory and cardiac exams were normal. He tolerated his first treatment well. Also due for wound care evaluation today Physician HBO Attestation: I certify that I supervised this HBO treatment in accordance with Medicare guidelines. A trained emergency response team is readily available per Yes hospital policies and procedures. Continue HBOT as ordered. Yes Electronic Signature(s) Signed: 07/07/2021 4:30:53 PM By: Baltazar Najjar MD Previous Signature: 07/07/2021 2:00:59 PM Version By: Haywood Pao EMT Entered By: Baltazar Najjar on 07/07/2021 16:02:47 -------------------------------------------------------------------------------- HBO Safety Checklist Details Patient Name: Date of Service: [name] 07/07/2021 10:00 A M Medical Record Number: [id number] Patient Account Number: [id number] Date of Birth/Sex: Treating RN: 04-Feb-1961 (60 y.o. Charlean Merl, Lauren Primary Care Ritu Gagliardo: [name] Other Clinician: Haywood Pao Referring Miyana Mordecai: Treating Amair Shrout/Extender: Avie Echevaria in Treatment: 4 HBO Safety Checklist Items Safety Checklist Consent Form Signed Patient voided / foley secured and emptied When did you last eato 0930 Last dose of injectable or oral agent n/a Ostomy pouch emptied and vented if applicable NA All implantable devices assessed, documented and approved NA Intravenous access site secured and place NA Valuables secured Linens and cotton and cotton/polyester blend (less than 51% polyester) Personal oil-based products / skin lotions / body lotions removed Wigs or hairpieces removed NA Smoking or tobacco materials removed NA Books / newspapers / magazines / loose paper removed Cologne, aftershave, perfume and deodorant removed Jewelry removed (may wrap wedding band) Make-up removed NA Hair care products removed Battery operated devices (external) removed Heating patches and chemical warmers  removed Titanium eyewear removed NA Nail polish cured greater than 10 hours NA Casting material cured greater than 10 hours NA Hearing aids removed NA Loose dentures or partials removed NA Prosthetics have been removed NA Patient demonstrates correct use of air break device (if applicable) Patient concerns have been addressed Patient grounding bracelet on  and cord attached to chamber Specifics for Inpatients (complete in addition to above) Medication sheet sent with patient NA Intravenous medications needed or due during therapy sent with patient NA Drainage tubes (e.g. nasogastric tube or chest tube secured and vented) NA Endotracheal or Tracheotomy tube secured NA Cuff deflated of air and inflated with saline NA Airway suctioned NA Notes Paper version used prior to treatment start. Data entered afterward. Electronic Signature(s) Signed: 07/07/2021 2:00:59 PM By: Haywood Pao EMT Entered By: Haywood Pao on 07/07/2021 11:55:48

## 2021-07-07 NOTE — Progress Notes (Signed)
[name], [name] ([id number]) Visit Report for 07/07/2021 Arrival Information Details Patient Name: Date of Service: [name], [name] 07/07/2021 12:30 PM Medical Record Number: [id number] Patient Account Number: [id number] Date of Birth/Sex: Treating RN: [birth year] (60 y.o. [name] Primary Care [name]: [name] Other Clinician: Referring [name]: Treating [name]/Extender: [name] in Treatment: 4 Visit Information History Since Last Visit Added or deleted any medications: No Patient Arrived: [name] Any new allergies or adverse reactions: No Arrival Time: 13:42 Had a fall or experienced change in No Transfer Assistance: None activities of daily living that may affect Patient Identification Verified: Yes risk of falls: Secondary Verification Process Completed: Yes Signs or symptoms of abuse/neglect since last visito No Patient Requires Transmission-Based Precautions: No Hospitalized since last visit: No Patient Has Alerts: Yes Implantable device outside of the clinic excluding No Patient Alerts: L ABI: 0.99 cellular tissue based products placed in the center R ABI: 1.05 since last visit: Has Dressing in Place as Prescribed: Yes Pain Present Now: No Electronic Signature(s) Signed: 07/07/2021 5:09:48 PM By: Lorrin Jackson Entered By: Lorrin Jackson on 07/07/2021 13:42:36 -------------------------------------------------------------------------------- Encounter Discharge Information Details Patient Name: Date of Service: [name] 07/07/2021 12:30 PM Medical Record Number: [id number] Patient Account Number: [id number] Date of Birth/Sex: Treating RN: 05/04/1961 (60 y.o. [name] Primary Care [name]: [name] Other Clinician: Referring [name]: Treating [name]/Extender: [name] in Treatment: 4 Encounter Discharge Information Items Post Procedure Vitals Discharge Condition: Stable Temperature  (F): 97.9 Ambulatory Status: Walker Pulse (bpm): 82 Discharge Destination: Home Respiratory Rate (breaths/min): 20 Transportation: Private Auto Blood Pressure (mmHg): 112/71 Schedule Follow-up Appointment: Yes Clinical Summary of Care: Provided on 07/07/2021 Form Type Recipient Paper Patient Patient Electronic Signature(s) Signed: 07/07/2021 5:08:04 PM By: Lorrin Jackson Entered By: Lorrin Jackson on 07/07/2021 17:08:04 -------------------------------------------------------------------------------- Lower Extremity Assessment Details Patient Name: Date of Service: [name], [name] 07/07/2021 12:30 PM Medical Record Number: [id number] Patient Account Number: [id number] Date of Birth/Sex: Treating RN: Oct 17, 1961 (59 y.o. [name] Primary Care Chakita Mcgraw: [name] Other Clinician: Referring Matyas Baisley: Treating Sebastain Fishbaugh/Extender: [name] in Treatment: 4 Edema Assessment Assessed: Shirlyn Goltz: Yes] [Right: No] Edema: [Left: Ye] [Right: s] Calf Left: Right: Point of Measurement: 36 cm From Medial Instep 42 cm Ankle Left: Right: Point of Measurement: 13 cm From Medial Instep 25 cm Vascular Assessment Pulses: Dorsalis Pedis Palpable: [Left:Yes] Electronic Signature(s) Signed: 07/07/2021 5:09:48 PM By: Lorrin Jackson Entered By: Lorrin Jackson on 07/07/2021 13:44:12 -------------------------------------------------------------------------------- Multi Wound Chart Details Patient Name: Date of Service: [name] 07/07/2021 12:30 PM Medical Record Number: [id number] Patient Account Number: [id number] Date of Birth/Sex: Treating RN: 1961/08/03 (60 y.o. [name], [name] Primary Care [name]: [name] Other Clinician: Referring Pasqualina Colasurdo: Treating [name]/Extender: [name] in Treatment: 4 Vital Signs Height(in): 73 Capillary Blood Glucose(mg/dl): 152 Weight(lbs): 270 Pulse(bpm): 75 Body Mass Index(BMI):  36 Blood Pressure(mmHg): 112/71 Temperature(F): 97.9 Respiratory Rate(breaths/min): 20 Photos: [3:Left, Plantar Foot] [N/A:N/A N/A] Wound Location: [3:Gradually Appeared] [N/A:N/A] Wounding Event: [3:Diabetic Wound/Ulcer of the Lower] [N/A:N/A] Primary Etiology: [3:Extremity Anemia, Sleep Apnea, Congestive] [N/A:N/A] Comorbid History: [3:Heart Failure, Hypertension, Peripheral Venous Disease, Cirrhosis , Type II Diabetes, Osteoarthritis, Neuropathy 03/18/2021] [N/A:N/A] Date Acquired: [3:4] [N/A:N/A] Weeks of Treatment: [3:Open] [N/A:N/A] Wound Status: [3:1.8x2.7x0.2] [N/A:N/A] Measurements L x W x D (cm) [3:3.817] [N/A:N/A] A (cm) : rea [3:0.763] [N/A:N/A] Volume (cm) : [3:46.00%] [N/A:N/A] % Reduction in A [3:rea: -7.90%] [N/A:N/A] % Reduction in Volume: [3:Grade 3] [N/A:N/A] Classification: [3:Medium] [N/A:N/A]  Exudate A mount: [3:Serosanguineous] [N/A:N/A] Exudate Type: [3:red, brown] [N/A:N/A] Exudate Color: [3:Distinct, outline attached] [N/A:N/A] Wound Margin: [3:Large (67-100%)] [N/A:N/A] Granulation A mount: [3:Red] [N/A:N/A] Granulation Quality: [3:None Present (0%)] [N/A:N/A] Necrotic A mount: [3:Fat Layer (Subcutaneous Tissue): Yes N/A] Exposed Structures: [3:Fascia: No Tendon: No Muscle: No Joint: No Bone: No None] [N/A:N/A] Epithelialization: [3:Debridement - Selective/Open Wound N/A] Debridement: Pre-procedure Verification/Time Out 13:49 [N/A:N/A] Taken: [3:Callus] [N/A:N/A] Tissue Debrided: [3:Skin/Dermis] [N/A:N/A] Level: [3:4.86] [N/A:N/A] Debridement A (sq cm): [3:rea Curette] [N/A:N/A] Instrument: [3:Minimum] [N/A:N/A] Bleeding: [3:Pressure] [N/A:N/A] Hemostasis A chieved: [3:Procedure was tolerated well] [N/A:N/A] Debridement Treatment Response: [3:1.8x2.7x0.2] [N/A:N/A] Post Debridement Measurements L x W x D (cm) [3:0.763] [N/A:N/A] Post Debridement Volume: (cm) [3:Debridement] [N/A:N/A] Treatment Notes Electronic Signature(s) Signed: 07/07/2021  4:30:53 PM By: Linton Ham MD Signed: 07/07/2021 6:09:57 PM By: Rhae Hammock RN Entered By: Linton Ham on 07/07/2021 14:02:46 -------------------------------------------------------------------------------- Multi-Disciplinary Care Plan Details Patient Name: Date of Service: [name], [name] 07/07/2021 12:30 PM Medical Record Number: [id number] Patient Account Number: [id number] Date of Birth/Sex: Treating RN: October 25, 1960 (60 y.o. [name] Primary Care Shirle Provencal: [name] Other Clinician: Referring Carlton Buskey: Treating Taralynn Quiett/Extender: [name] in Treatment: 4 Multidisciplinary Care Plan reviewed with physician Active Inactive HBO Nursing Diagnoses: Anxiety related to feelings of confinement associated with the hyperbaric oxygen chamber Anxiety related to knowledge deficit of hyperbaric oxygen therapy and treatment procedures Discomfort related to temperature and humidity changes inside hyperbaric chamber Potential for barotraumas to ears, sinuses, teeth, and lungs or cerebral gas embolism related to changes in atmospheric pressure inside hyperbaric oxygen chamber Potential for oxygen toxicity seizures related to delivery of 100% oxygen at an increased atmospheric pressure Potential for pulmonary oxygen toxicity related to delivery of 100% oxygen at an increased atmospheric pressure Goals: Barotrauma will be prevented during HBO2 Date Initiated: 06/04/2021 T arget Resolution Date: 08/14/2021 Goal Status: Active Patient and/or family will be able to state/discuss factors appropriate to the management of their disease process during treatment Date Initiated: 06/04/2021 T arget Resolution Date: 08/14/2021 Goal Status: Active Patient will tolerate the hyperbaric oxygen therapy treatment Date Initiated: 06/04/2021 T arget Resolution Date: 08/14/2021 Goal Status: Active Patient will tolerate the internal climate of the chamber Date  Initiated: 06/04/2021 T arget Resolution Date: 08/14/2021 Goal Status: Active Patient/caregiver will verbalize understanding of HBO goals, rationale, procedures and potential hazards Date Initiated: 06/04/2021 T arget Resolution Date: 08/14/2021 Goal Status: Active Signs and symptoms of pulmonary oxygen toxicity will be recognized and promptly addressed Date Initiated: 06/04/2021 T arget Resolution Date: 08/14/2021 Goal Status: Active Signs and symptoms of seizure will be recognized and promptly addressed ; seizing patients will suffer no harm Date Initiated: 06/04/2021 T arget Resolution Date: 08/14/2021 Goal Status: Active Interventions: Administer a five (5) minute air break for patient if signs and symptoms of seizure appear and notify the hyperbaric physician Administer decongestants, per physician orders, prior to HBO2 Administer the correct therapeutic gas delivery based on the patients needs and limitations, per physician order Assess and provide for patients comfort related to the hyperbaric environment and equalization of middle ear Assess for signs and symptoms related to adverse events, including but not limited to confinement anxiety, pneumothorax, oxygen toxicity and baurotrauma Assess patient for any history of confinement anxiety Assess patient's knowledge and expectations regarding hyperbaric medicine and provide education related to the hyperbaric environment, goals of treatment and prevention of adverse events Implement protocols to decrease risk of pneumothorax in high risk patients Notes: Nutrition Nursing Diagnoses: Impaired glucose control: actual  or potential Potential for alteratiion in Nutrition/Potential for imbalanced nutrition Goals: Patient/caregiver agrees to and verbalizes understanding of need to use nutritional supplements and/or vitamins as prescribed Date Initiated: 06/04/2021 Date Inactivated: 07/07/2021 Target Resolution Date: 07/04/2021 Goal Status:  Met Patient/caregiver will maintain therapeutic glucose control Date Initiated: 06/04/2021 Target Resolution Date: 08/04/2021 Goal Status: Active Interventions: Assess HgA1c results as ordered upon admission and as needed Assess patient nutrition upon admission and as needed per policy Provide education on elevated blood sugars and impact on wound healing Notes: 07/07/21: Glucose control ongoing Wound/Skin Impairment Nursing Diagnoses: Impaired tissue integrity Knowledge deficit related to ulceration/compromised skin integrity Goals: Patient/caregiver will verbalize understanding of skin care regimen Date Initiated: 06/04/2021 Target Resolution Date: 08/04/2021 Goal Status: Active Interventions: Assess patient/caregiver ability to obtain necessary supplies Assess patient/caregiver ability to perform ulcer/skin care regimen upon admission and as needed Assess ulceration(s) every visit Provide education on ulcer and skin care Notes: Electronic Signature(s) Signed: 07/07/2021 5:09:48 PM By: Lorrin Jackson Entered By: Lorrin Jackson on 07/07/2021 13:45:24 -------------------------------------------------------------------------------- Pain Assessment Details Patient Name: Date of Service: [name], [name] 07/07/2021 12:30 PM Medical Record Number: [id number] Patient Account Number: [id number] Date of Birth/Sex: Treating RN: 1961-10-01 (60 y.o. [name] Primary Care Rozelia Catapano: [name] Other Clinician: Referring Kharma Sampsel: Treating Zelphia Glover/Extender: [name] in Treatment: 4 Active Problems Location of Pain Severity and Description of Pain Patient Has Paino No Site Locations Pain Management and Medication Current Pain Management: Electronic Signature(s) Signed: 07/07/2021 5:09:48 PM By: Lorrin Jackson Entered By: Lorrin Jackson on 07/07/2021  13:41:36 -------------------------------------------------------------------------------- Patient/Caregiver Education Details Patient Name: Date of Service: [name] 9/20/2022andnbsp12:30 PM Medical Record Number: [id number] Patient Account Number: [id number] Date of Birth/Gender: Treating RN: [birth year] (60 y.o. [name] Primary Care Physician: [name] Other Clinician: Referring Physician: Treating Physician/Extender: [name] in Treatment: 4 Education Assessment Education Provided To: Patient Education Topics Provided Elevated Blood Sugar/ Impact on Healing: Methods: Explain/Verbal Responses: State content correctly Wound/Skin Impairment: Methods: Explain/Verbal, Printed Responses: State content correctly Electronic Signature(s) Signed: 07/07/2021 5:09:48 PM By: Lorrin Jackson Entered By: Lorrin Jackson on 07/07/2021 13:45:48 -------------------------------------------------------------------------------- Wound Assessment Details Patient Name: Date of Service: [name], [name] 07/07/2021 12:30 PM Medical Record Number: [id number] Patient Account Number: [id number] Date of Birth/Sex: Treating RN: October 23, 1960 (60 y.o. [name] Primary Care Oliana Gowens: [name] Other Clinician: Referring Soleia Badolato: Treating Kashauna Celmer/Extender: [name] in Treatment: 4 Wound Status Wound Number: 3 Primary Diabetic Wound/Ulcer of the Lower Extremity Etiology: Wound Location: Left, Plantar Foot Wound Open Wounding Event: Gradually Appeared Status: Date Acquired: 03/18/2021 Comorbid Anemia, Sleep Apnea, Congestive Heart Failure, Hypertension, Weeks Of Treatment: 4 History: Peripheral Venous Disease, Cirrhosis , Type II Diabetes, Clustered Wound: No Osteoarthritis, Neuropathy Photos Wound Measurements Length: (cm) 1.8 Width: (cm) 2.7 Depth: (cm) 0.2 Area: (cm) 3.817 Volume: (cm) 0.763 % Reduction in Area:  46% % Reduction in Volume: -7.9% Epithelialization: None Tunneling: No Undermining: No Wound Description Classification: Grade 3 Wound Margin: Distinct, outline attached Exudate Amount: Medium Exudate Type: Serosanguineous Exudate Color: red, brown Foul Odor After Cleansing: No Slough/Fibrino No Wound Bed Granulation Amount: Large (67-100%) Exposed Structure Granulation Quality: Red Fascia Exposed: No Necrotic Amount: None Present (0%) Fat Layer (Subcutaneous Tissue) Exposed: Yes Tendon Exposed: No Muscle Exposed: No Joint Exposed: No Bone Exposed: No Treatment Notes Wound #3 (Foot) Wound Laterality: Plantar, Left Cleanser Soap and Water Discharge Instruction: May shower and wash wound with dial antibacterial soap and water prior  to dressing change. Wound Cleanser Discharge Instruction: Cleanse the wound with wound cleanser prior to applying a clean dressing using gauze sponges, not tissue or cotton balls. Peri-Wound Care Topical Primary Dressing MediHoney Calcium Alginate Dressing 4x5 in Discharge Instruction: Apply to wound bed as instructed Secondary Dressing Woven Gauze Sponge, Non-Sterile 4x4 in Discharge Instruction: Apply over primary dressing as directed. ABD Pad, 5x9 Discharge Instruction: Apply over primary dressing as directed. Secured With [employer], 4.5x3.1 (in/yd) Discharge Instruction: Secure with Kerlix as directed. 18M Medipore H Soft Cloth Surgical T ape, 2x2 (in/yd) Discharge Instruction: Secure dressing with tape as directed. Compression Wrap Compression Stockings Add-Ons Electronic Signature(s) Signed: 07/07/2021 5:09:48 PM By: Lorrin Jackson Signed: 07/07/2021 5:42:07 PM By: [name] RN, BSN Entered By: Lorrin Jackson on 07/07/2021 13:42:02 -------------------------------------------------------------------------------- Vitals Details Patient Name: Date of Service: [name], [name] 07/07/2021 12:30 PM Medical Record Number:  [id number] Patient Account Number: [id number] Date of Birth/Sex: Treating RN: 08-Jun-1961 (60 y.o. [name], [name] Primary Care Auguste Tebbetts: [name] Other Clinician: Referring Nuchem Grattan: Treating Haziel Molner/Extender: [name] in Treatment: 4 Vital Signs Time Taken: 13:19 Temperature (F): 97.9 Height (in): 73 Pulse (bpm): 82 Weight (lbs): 270 Respiratory Rate (breaths/min): 20 Body Mass Index (BMI): 35.6 Blood Pressure (mmHg): 112/71 Capillary Blood Glucose (mg/dl): 152 Reference Range: 80 - 120 mg / dl Electronic Signature(s) Signed: 07/07/2021 2:00:59 PM By: Donavan Burnet EMT Entered By: Donavan Burnet on 07/07/2021 13:20:39

## 2021-07-08 ENCOUNTER — Encounter (HOSPITAL_BASED_OUTPATIENT_CLINIC_OR_DEPARTMENT_OTHER): Payer: Medicare Other | Admitting: Physician Assistant

## 2021-07-08 ENCOUNTER — Ambulatory Visit: Payer: Medicare Other | Admitting: *Deleted

## 2021-07-08 DIAGNOSIS — R7303 Prediabetes: Secondary | ICD-10-CM

## 2021-07-08 DIAGNOSIS — E11621 Type 2 diabetes mellitus with foot ulcer: Secondary | ICD-10-CM | POA: Diagnosis not present

## 2021-07-08 DIAGNOSIS — I5032 Chronic diastolic (congestive) heart failure: Secondary | ICD-10-CM

## 2021-07-08 LAB — GLUCOSE, CAPILLARY
Glucose-Capillary: 155 mg/dL — ABNORMAL HIGH (ref 70–99)
Glucose-Capillary: 123 mg/dL — ABNORMAL HIGH (ref 70–99)

## 2021-07-08 NOTE — Chronic Care Management (AMB) (Signed)
Chronic Care Management   CCM RN Visit Note  07/08/2021 Name: [name] MRN: [id number] DOB: 1961-02-21  Subjective: [name] is a 60 y.o. year old male who is a primary care patient of Burns, Bobette Mo, MD. The care management team was consulted for assistance with disease management and care coordination needs.    Engaged with patient by telephone for follow up visit in response to provider referral for case management and/or care coordination services.   Consent to Services:  The patient was given information about Chronic Care Management services, agreed to services, and gave verbal consent prior to initiation of services.  Please see initial visit note for detailed documentation.  Patient agreed to services and verbal consent obtained.   Assessment: Review of patient past medical history, allergies, medications, health status, including review of consultants reports, laboratory and other test data, was performed as part of comprehensive evaluation and provision of chronic care management services.   CCM Care Plan Allergies  Allergen Reactions   Claritin [Loratadine] Shortness Of Breath and Anxiety   Outpatient Encounter Medications as of 07/08/2021  Medication Sig Note   acamprosate (CAMPRAL) 333 MG tablet Take 333 mg by mouth 2 (two) times daily.    amoxicillin-clavulanate (AUGMENTIN) 875-125 MG tablet Take 1 tablet by mouth 2 (two) times daily.    docusate sodium (COLACE) 100 MG capsule TAKE 1 CAPSULE (100 MG TOTAL) BY MOUTH DAILY AS NEEDED.    doxycycline (VIBRA-TABS) 100 MG tablet Take 1 tablet (100 mg total) by mouth 2 (two) times daily.    DULoxetine (CYMBALTA) 20 MG capsule Take 1 capsule (20 mg total) by mouth daily.    furosemide (LASIX) 40 MG tablet TAKE 1 TABLET BY MOUTH TWICE A DAY    gabapentin (NEURONTIN) 100 MG capsule Take 1 capsule (100 mg total) by mouth 3 (three) times daily.    HEMP OIL-VANILLYL BUTYL ETHER EX Apply 1 application topically 2 (two) times  daily as needed (arthritis pain).    HYDROcodone-acetaminophen (NORCO/VICODIN) 5-325 MG tablet Take 1 tablet by mouth every 8 (eight) hours as needed for moderate pain.    hydrOXYzine (ATARAX/VISTARIL) 25 MG tablet Take 25 mg by mouth every 6 (six) hours as needed for anxiety.    levothyroxine (SYNTHROID) 50 MCG tablet Take 50 mcg by mouth daily before breakfast.    metoprolol tartrate (LOPRESSOR) 50 MG tablet Take 1 tablet (50 mg total) by mouth 2 (two) times daily.    naltrexone (DEPADE) 50 MG tablet Take 50 mg by mouth daily. 04/27/2021: Ran out   naproxen sodium (ALEVE) 220 MG tablet Take 220 mg by mouth every 12 (twelve) hours.    ondansetron (ZOFRAN) 4 MG tablet Take 4 mg by mouth every 8 (eight) hours as needed for nausea or vomiting.    potassium chloride SA (KLOR-CON M20) 20 MEQ tablet Take 3 tablets (60 mEq total) by mouth 2 (two) times daily.    traZODone (DESYREL) 50 MG tablet TAKE 1-2 TABLETS (50-100 MG TOTAL) BY MOUTH AT BEDTIME AS NEEDED FOR SLEEP.    vitamin B-12 (CYANOCOBALAMIN) 100 MCG tablet Take 100 mcg by mouth daily.    No facility-administered encounter medications on file as of 07/08/2021.   Patient Active Problem List   Diagnosis Date Noted   Critical lower limb ischemia (HCC) 05/29/2021   Alcohol abuse 04/28/2021   Diabetic foot ulcer (HCC) 04/28/2021   Severe sepsis (HCC) 04/28/2021   Osteomyelitis (HCC) 04/27/2021   Anxiety 03/17/2021   Amputated toe of right  foot (HCC) 03/03/2020   Alcoholic peripheral neuropathy (HCC) 10/28/2019   Chronic diastolic CHF (congestive heart failure) (HCC) 10/15/2019   Chronic foot ulcer (HCC) 10/15/2019   Alcoholic cirrhosis of liver without ascites (HCC) 04/27/2019   Obesity hypoventilation syndrome (HCC) 04/27/2019   Depression 04/25/2019   Severe alcohol use disorder (HCC)    DOE (dyspnea on exertion) 04/22/2019   Hypothyroidism 04/22/2019   Anemia 02/06/2019   Acute on chronic diastolic CHF (congestive heart failure) (HCC)  01/30/2019   Avascular necrosis of hip, left (HCC) 11/02/2018   Decreased hearing of both ears 10/30/2018   Avascular necrosis of hip, right (HCC) 08/16/2018   Bilateral leg edema 02/27/2018   Marijuana abuse 02/16/2018   Ulcer of left heel (HCC) 12/21/2016   Bilateral carpal tunnel syndrome 12/15/2016   Hyperlipidemia 11/25/2016   Prediabetes 11/25/2016   Hypertension 11/23/2016   History of osteomyelitis 11/23/2016   Morbid obesity (HCC) 11/23/2016   OSA (obstructive sleep apnea) 11/23/2016   Other hammer toe (acquired) 11/11/2013   Exstrophy of bladder 11/11/2013   Conditions to be addressed/monitored:  CHF and DMII (pre-Diabetes)  Care Plan : RN Care Manager PLan of Care  Updates made by Michaela Corner, RN since 07/08/2021 12:00 AM     Problem: Chronic Disease Management Needs   Priority: Medium     Long-Range Goal: Development of plan of care for long term chronic disease management   Start Date: 06/24/2021  Expected End Date: 06/24/2022  Priority: Medium  Note:   Current Barriers:  Chronic Disease Management support and education needs related to CHF, DMII, and pre- DM  RNCM Clinical Goal(s):  Patient will demonstrate ongoing health management independence CHF/ pre-DMII  through collaboration with RN Care manager, provider, and care team.   Interventions: 1:1 collaboration with primary care provider regarding development and update of comprehensive plan of care as evidenced by provider attestation and co-signature Inter-disciplinary care team collaboration (see longitudinal plan of care) Evaluation of current treatment plan related to  self management and patient's adherence to plan as established by provider  Heart Failure Interventions:  (Status: Goal on track: YES.) Basic overview and discussion of pathophysiology of Heart Failure reviewed; Provided education on low sodium diet; Reviewed Heart Failure Action Plan in depth and provided written copy; Assessed need  for readable accurate scales in home; Provided education about placing scale on hard, flat surface; Discussed importance of daily weight and advised patient to weigh and record daily; Reviewed role of diuretics in prevention of fluid overload and management of heart failure; Discussed the importance of keeping all appointments with provider; Provided patient with education about the role of exercise in the management of heart failure; Confirmed no current clinical concerns: reiterated previously provided education around self-health management of CHF, action plan for signs/ symptoms yellow CHF zone Confirmed home health team continues visiting patient at home weekly for wound status monitoring  Discussed patient's management of back-hip/ leg pain/ discomfort: he believes this may be sciatica, possibly due to limitations of weight bearing: currently receiving acupuncture, has had one acupuncture treatment- states minimally helpful, but was told it may take several treatment sessions: encouraged to keep PCP informed of status, possible need for PCP office visit, possible referal to sports medicine clinic at [facility]: patient will consider Confirmed no changes to/ concerns with medications: patient continues to independently self- manage Confirmed continues not consuming alcohol: states "feels better;" positive reinforcement provided Discussed heart healthy, low sodium diet: patient continues to follow prescribed diet Discussed  with patient options around obtaining flu vaccine for upcoming flu season: he reports he has not taken flu vaccine and does not plan to take (personal preference) Initial assessment completed  Diabetes:  (Status: Goal on track: YES.) Lab Results  Component Value Date   HGBA1C 6.6 (H) 04/27/2021  Assessed patient's understanding of diabetes/ A1-C goal: <6.5%: reviewed recent A1-C trends with patient; provided education around correlation of A1-C values to average blood sugars at  home, over 77-month time period Patient reports he has never been diagnosed formally with DM: reports "pre-DM;" again confirms not on medication for DM: wonders if "Diabetes" should be on his history in MEDICAL RECORD NUMBERencouraged patient to discuss this with PCP Provided education to patient about basic DM disease process; Reviewed prescribed diet with patient low carb, low sugar/ heart healthy/ low salt; Counseled on importance of regular laboratory monitoring as prescribed;        Discussed plans with patient for ongoing care management follow up and provided patient with direct contact information for care management team;      Reviewed scheduled/upcoming provider appointments including: rotating/ regular- wound / hyperbaric clinic; 07/15/21- ID provider; 09/04/21- cardiology provider;         Review of patient status, including review of consultants reports, relevant laboratory and other test results, and medications completed;       Reviewed with patient recent wound clinic visits: he reports team obtains blood sugars at each visit: reports his blood sugar this morning was "143" post-prandial, pre-hyperbaric treatment Does not monitor/ record blood sugars at home Discussed/ provided education around long-term complications of DM with patient, need for routine regular annual eye exams and encouraged patient to obtain annual eye exam Mailed "Living Well with Diabetes" booklet to patient- encouraged him to periodically review for further discussion  Patient Goals/Self-Care Activities: Patient will self administer medications as prescribed Patient will attend all scheduled provider appointments Patient will call pharmacy for medication refills Patient will call provider office for new concerns or questions Patient will continue to work with home health team Patient will continue to use assistive devices as indicated to prevent falls at home as his (L) foot wound heals Patient will continue to not  consume alcohol Patient will continue to attend wound clinic visits Patient will begin monitoring and writing down on paper his weights at home, 3-4 times per week Patient will continue to follow a heart healthy, low salt, low-carbohydrate diet Patient will begin reviewing "Living Well with Diabetes" booklet      Plan: Telephone follow up appointment with care management team member scheduled for:  Tuesday, October 06, 2021 at 2:00 pm The patient has been provided with contact information for the care management team and has been advised to call with any health related questions or concerns  Caryl Pina, RN, BSN, CCRN Alumnus [facility] Clinic RN Care Coordination- [facility] [place] [phone]: direct office [phone]: mobile

## 2021-07-08 NOTE — Patient Instructions (Signed)
Visit Information  [name], it was nice talking with you today.   I look forward to talking to you again for an update on Tuesday, October 06, 2021 at 2:00 pm- please be listening out for my call that day.  I will call as close to 2:00 pm as possible.   If you need to cancel or re-schedule our telephone visit, please call [phone] and one of our care guides will be happy to assist you.   I look forward to hearing about your progress.   Please don't hesitate to contact me if I can be of assistance to you before our next scheduled telephone appointment.   [name] Pina, RN, BSN, CCRN [facility] RN Care Coordination- [facility] [phone]: direct office [phone]: mobile   PATIENT GOALS:  Goals Addressed             This Visit's Progress    Patient Self-Care Activities   On track    Timeframe:  Long-Range Goal Priority:  Medium Start Date:         06/24/21                    Expected End Date:     06/24/22                  Patient will self administer medications as prescribed Patient will attend all scheduled provider appointments Patient will call pharmacy for medication refills Patient will call provider office for new concerns or questions Patient will continue to work with home health team Patient will continue to use assistive devices as indicated to prevent falls at home as his (L) foot wound heals Patient will continue to not consume alcohol Patient will continue to attend wound clinic visits Patient will begin monitoring and writing down on paper his weights at home, 3-4 times per week Patient will continue to follow a heart healthy, low salt, low-carbohydrate diet Patient will begin reviewing "Living Well with Diabetes" booklet       Heart Failure Action Plan A heart failure action plan helps you understand what to do when you have symptoms of heart failure. Your action plan is a color-coded plan that lists the symptoms to watch  for and indicates what actions to take. If you have symptoms in the red zone, you need medical care right away. If you have symptoms in the yellow zone, you are having problems. If you have symptoms in the green zone, you are doing well. Follow the plan that was created by you and your health care provider. Review your plan each time you visit your health care provider. Red zone These signs and symptoms mean you should get medical help right away: You have trouble breathing when resting. You have a dry cough that is getting worse. You have swelling or pain in your legs or abdomen that is getting worse. You suddenly gain more than 2-3 lb (0.9-1.4 kg) in 24 hours, or more than 5 lb (2.3 kg) in a week. This amount may be more or less depending on your condition. You have trouble staying awake or you feel confused. You have chest pain. You do not have an appetite. You pass out. You have worsening sadness or depression. If you have any of these symptoms, call your local emergency services (911 in the [place] right away. Do not drive yourself to the hospital. Yellow zone These signs and symptoms mean your condition may be getting worse and you  should make some changes: You have trouble breathing when you are active, or you need to sleep with your head raised on extra pillows to help you breathe. You have swelling in your legs or abdomen. You gain 2-3 lb (0.9-1.4 kg) in 24 hours, or 5 lb (2.3 kg) in a week. This amount may be more or less depending on your condition. You get tired easily. You have trouble sleeping. You have a dry cough. If you have any of these symptoms: Contact your health care provider within the next day. Your health care provider may adjust your medicines. Green zone These signs mean you are doing well and can continue what you are doing: You do not have shortness of breath. You have very little swelling or no new swelling. Your weight is stable (no gain or loss). You  have a normal activity level. You do not have chest pain or any other new symptoms. Follow these instructions at home: Take over-the-counter and prescription medicines only as told by your health care provider. Weigh yourself daily. Your target weight is __________ lb (__________ kg). Call your health care provider if you gain more than __________ lb (__________ kg) in 24 hours, or more than __________ lb (__________ kg) in a week. Health care provider name: _____________________________________________________ Health care provider phone number: _____________________________________________________ Eat a heart-healthy diet. Work with a diet and nutrition specialist (dietitian) to create an eating plan that is best for you. Keep all follow-up visits. This is important. Where to find more information American Heart Association: www.heart.org Summary A heart failure action plan helps you understand what to do when you have symptoms of heart failure. Follow the action plan that was created by you and your health care provider. Get help right away if you have any symptoms in the red zone. This information is not intended to replace advice given to you by your health care provider. Make sure you discuss any questions you have with your health care provider. Document Revised: 05/19/2020 Document Reviewed: 05/19/2020 Elsevier Patient Education  2022 [employer].  The patient verbalized understanding of instructions, educational materials, and care plan provided today and agreed to receive a mailed copy of patient instructions, educational materials, and care plan Telephone follow up appointment with care management team member scheduled for:  Tuesday, October 06, 2021 at 2:00 pm The patient has been provided with contact information for the care management team and has been advised to call with any health related questions or concerns  [name] Pina, RN, BSN, CCRN [facility]umn[facility] RN  Care Coordination- [facility] [phone]: direct office [phone]: mobile

## 2021-07-09 ENCOUNTER — Encounter (HOSPITAL_BASED_OUTPATIENT_CLINIC_OR_DEPARTMENT_OTHER): Payer: Medicare Other | Admitting: Internal Medicine

## 2021-07-09 ENCOUNTER — Other Ambulatory Visit: Payer: Self-pay

## 2021-07-09 DIAGNOSIS — E11621 Type 2 diabetes mellitus with foot ulcer: Secondary | ICD-10-CM | POA: Diagnosis not present

## 2021-07-09 LAB — GLUCOSE, CAPILLARY
Glucose-Capillary: 149 mg/dL — ABNORMAL HIGH (ref 70–99)
Glucose-Capillary: 120 mg/dL — ABNORMAL HIGH (ref 70–99)

## 2021-07-09 NOTE — Progress Notes (Signed)
[name], [name] ([id number]) Visit Report for 07/08/2021 Arrival Information Details Patient Name: Date of Service: [name], [name] 07/08/2021 10:00 A M Medical Record Number: [id number] Patient Account Number: [id number] Date of Birth/Sex: Treating RN: 1961-06-12 (60 y.o. [name] Primary Care Jaselynn Tamas: [name] Other Clinician: Haywood Pao Referring Adalai Perl: Treating Carnetta Losada/Extender: [name] in Treatment: 4 Visit Information History Since Last Visit All ordered tests and consults were completed: Yes Patient Arrived: [name] Added or deleted any medications: No Arrival Time: 10:13 Any new allergies or adverse reactions: No Accompanied By: self Had a fall or experienced change in No Transfer Assistance: None activities of daily living that may affect Patient Identification Verified: Yes risk of falls: Secondary Verification Process Completed: Yes Signs or symptoms of abuse/neglect since last visito No Patient Requires Transmission-Based Precautions: No Hospitalized since last visit: No Patient Has Alerts: Yes Implantable device outside of the clinic excluding No Patient Alerts: L ABI: 0.99 cellular tissue based products placed in the center R ABI: 1.05 since last visit: Pain Present Now: No Electronic Signature(s) Signed: 07/09/2021 11:15:22 AM By: Haywood Pao EMT Entered By: Haywood Pao on 07/08/2021 13:22:37 -------------------------------------------------------------------------------- Encounter Discharge Information Details Patient Name: Date of Service: [name] 07/08/2021 10:00 A M Medical Record Number: [id number] Patient Account Number: [id number] Date of Birth/Sex: Treating RN: Apr 01, 1961 (60 y.o. [name] Primary Care Baraka Klatt: [name] Other Clinician: Haywood Pao Referring Jerah Esty: Treating Davari Lopes/Extender: [name] in Treatment: 4 Encounter  Discharge Information Items Discharge Condition: Stable Ambulatory Status: Walker Discharge Destination: Home Transportation: Private Auto Accompanied By: self Schedule Follow-up Appointment: No Clinical Summary of Care: Electronic Signature(s) Signed: 07/09/2021 11:15:22 AM By: Haywood Pao EMT Entered By: Haywood Pao on 07/08/2021 13:22:57 -------------------------------------------------------------------------------- Vitals Details Patient Name: Date of Service: [name] 07/08/2021 10:00 A M Medical Record Number: [id number] Patient Account Number: [id number] Date of Birth/Sex: Treating RN: 08/10/1961 (60 y.o. [name] Primary Care Janeisha Ryle: [name] Other Clinician: Haywood Pao Referring Pearlena Ow: Treating Wen Munford/Extender: [name] in Treatment: 4 Vital Signs Time Taken: 10:17 Temperature (F): 97.8 Height (in): 73 Pulse (bpm): 97 Weight (lbs): 270 Respiratory Rate (breaths/min): 16 Body Mass Index (BMI): 35.6 Blood Pressure (mmHg): 106/66 Capillary Blood Glucose (mg/dl): 979 Reference Range: 80 - 120 mg / dl Electronic Signature(s) Signed: 07/09/2021 11:15:22 AM By: Haywood Pao EMT Entered By: Haywood Pao on 07/08/2021 11:12:23

## 2021-07-09 NOTE — Progress Notes (Signed)
[name], [name] ([id number]) Visit Report for 07/08/2021 SuperBill Details Patient Name: Date of Service: [name], [name] 07/08/2021 Medical Record Number: [id number] Patient Account Number: [id number] Date of Birth/Sex: Treating RN: 1960/12/19 (60 y.o. [name] Primary Care Provider: [name] Other Clinician: Haywood Pao Referring Provider: Treating Provider/Extender: [name] in Treatment: 4 Diagnosis Coding ICD-10 Codes Code Description [phone] Type 2 diabetes mellitus with foot ulcer M14.672 Charcot's joint, left ankle and foot L97.528 Non-pressure chronic ulcer of other part of left foot with other specified severity M86.372 Chronic multifocal osteomyelitis, left ankle and foot Facility Procedures CPT4 Code Description Modifier Quantity 98264158 G0277-(Facility Use Only) HBOT full body chamber, , 5 ICD-10 Diagnosis Description E11.621 Type 2 diabetes mellitus with foot ulcer M14.672 Charcot's joint, left ankle and foot L97.528 Non-pressure chronic ulcer of other part of left foot with other specified severity M86.372 Chronic multifocal osteomyelitis, left ankle and foot Physician Procedures Quantity CPT4 Code Description Modifier [id number] 99183 - WC PHYS HYPERBARIC OXYGEN THERAPY 1 ICD-10 Diagnosis Description E11.621 Type 2 diabetes mellitus with foot ulcer M14.672 Charcot's joint, left ankle and foot L97.528 Non-pressure chronic ulcer of other part of left foot with other specified severity M86.372 Chronic multifocal osteomyelitis, left ankle and foot Electronic Signature(s) Signed: 07/09/2021 11:15:22 AM By: Haywood Pao EMT Signed: 07/09/2021 2:21:00 PM By: Lenda Kelp PA-C Entered By: Haywood Pao on 07/08/2021 13:22:24

## 2021-07-09 NOTE — Progress Notes (Signed)
SHAUNTE, WEISSINGER ([id number]) Visit Report for 07/09/2021 SuperBill Details Patient Name: Date of Service: TALHA, ISER 07/09/2021 Medical Record Number: [id number] Patient Account Number: [id number] Date of Birth/Sex: Treating RN: 10-09-1961 (61 y.o. [name] Primary Care Provider: [name] Other Clinician: Haywood Pao Referring Provider: Treating Provider/Extender: Avie Echevaria in Treatment: 5 Diagnosis Coding ICD-10 Codes Code Description [phone] Type 2 diabetes mellitus with foot ulcer M14.672 Charcot's joint, left ankle and foot L97.528 Non-pressure chronic ulcer of other part of left foot with other specified severity M86.372 Chronic multifocal osteomyelitis, left ankle and foot Facility Procedures CPT4 Code Description Modifier Quantity [id number] G0277-(Facility Use Only) HBOT full body chamber, , 4 ICD-10 Diagnosis Description E11.621 Type 2 diabetes mellitus with foot ulcer M14.672 Charcot's joint, left ankle and foot L97.528 Non-pressure chronic ulcer of other part of left foot with other specified severity M86.372 Chronic multifocal osteomyelitis, left ankle and foot Physician Procedures Quantity CPT4 Code Description Modifier [id number] 99183 - WC PHYS HYPERBARIC OXYGEN THERAPY 1 ICD-10 Diagnosis Description E11.621 Type 2 diabetes mellitus with foot ulcer M14.672 Charcot's joint, left ankle and foot L97.528 Non-pressure chronic ulcer of other part of left foot with other specified severity M86.372 Chronic multifocal osteomyelitis, left ankle and foot Electronic Signature(s) Signed: 07/09/2021 1:13:53 PM By: Haywood Pao EMT Signed: 07/09/2021 4:58:28 PM By: Baltazar Najjar MD Entered By: Haywood Pao on 07/09/2021 13:09:44

## 2021-07-09 NOTE — Progress Notes (Signed)
KASH, DAVIE ([id number]) Visit Report for 07/09/2021 Arrival Information Details Patient Name: Date of Service: NUSSEN, PULLIN 07/09/2021 10:00 A M Medical Record Number: [id number] Patient Account Number: [id number] Date of Birth/Sex: Treating RN: 19-Jul-1961 (60 y.o. [name], [name] Primary Care Hartleigh Edmonston: [name] Other Clinician: Haywood Pao Referring De Libman: Treating Meaghan Whistler/Extender: Avie Echevaria in Treatment: 5 Visit Information History Since Last Visit All ordered tests and consults were completed: Yes Patient Arrived: [name] Added or deleted any medications: No Arrival Time: 10:01 Any new allergies or adverse reactions: No Accompanied By: self Had a fall or experienced change in No Transfer Assistance: None activities of daily living that may affect Patient Identification Verified: Yes risk of falls: Secondary Verification Process Completed: Yes Signs or symptoms of abuse/neglect since last visito No Patient Requires Transmission-Based Precautions: No Hospitalized since last visit: No Patient Has Alerts: Yes Implantable device outside of the clinic excluding No Patient Alerts: L ABI: 0.99 cellular tissue based products placed in the center R ABI: 1.05 since last visit: Pain Present Now: No Electronic Signature(s) Signed: 07/09/2021 1:13:53 PM By: Haywood Pao EMT Entered By: Haywood Pao on 07/09/2021 13:10:27 -------------------------------------------------------------------------------- Encounter Discharge Information Details Patient Name: Date of Service: [name] 07/09/2021 10:00 A M Medical Record Number: [id number] Patient Account Number: [id number] Date of Birth/Sex: Treating RN: 05-02-1961 (60 y.o. [name] Primary Care Edy Belt: [name] Other Clinician: Haywood Pao Referring Undrea Shipes: Treating Annalisia Ingber/Extender: Avie Echevaria in Treatment: 5 Encounter Discharge  Information Items Discharge Condition: Stable Ambulatory Status: Walker Discharge Destination: Home Transportation: Private Auto Accompanied By: self Schedule Follow-up Appointment: No Clinical Summary of Care: Electronic Signature(s) Signed: 07/09/2021 1:13:53 PM By: Haywood Pao EMT Entered By: Haywood Pao on 07/09/2021 13:10:14 -------------------------------------------------------------------------------- Vitals Details Patient Name: Date of Service: [name] 07/09/2021 10:00 A M Medical Record Number: [id number] Patient Account Number: [id number] Date of Birth/Sex: Treating RN: 1961-02-21 (60 y.o. [name] Primary Care Gaetana Kawahara: [name] Other Clinician: Haywood Pao Referring Clemens Lachman: Treating Jamarr Treinen/Extender: Avie Echevaria in Treatment: 5 Vital Signs Time Taken: 10:16 Temperature (F): 97.7 Height (in): 73 Pulse (bpm): 107 Weight (lbs): 270 Respiratory Rate (breaths/min): 12 Body Mass Index (BMI): 35.6 Blood Pressure (mmHg): 129/79 Capillary Blood Glucose (mg/dl): 086 Reference Range: 80 - 120 mg / dl Electronic Signature(s) Signed: 07/09/2021 1:13:53 PM By: Haywood Pao EMT Entered By: Haywood Pao on 07/09/2021 11:29:05

## 2021-07-09 NOTE — Progress Notes (Signed)
GABRIELL, CASIMIR ([id number]) Visit Report for 07/09/2021 HBO Details Patient Name: Date of Service: AVELINO, HERREN 07/09/2021 10:00 A M Medical Record Number: [id number] Patient Account Number: [id number] Date of Birth/Sex: Treating RN: 1960/12/14 (60 y.o. [name] Primary Care [name] [name]: [name] Other Clinician: Haywood Pao Referring Abiel Antrim: Treating Jayan Raymundo/Extender: Avie Echevaria in Treatment: 5 HBO Treatment Course Details Treatment Course Number: 1 Ordering Aaniya Sterba: Baltazar Najjar T Treatments Ordered: otal 40 HBO Treatment Start Date: 07/07/2021 HBO Indication: Chronic Refractory Osteomyelitis to Left,Plantar Foot HBO Treatment Details Treatment Number: 3 Patient Type: Outpatient Chamber Type: Monoplace Chamber Serial #: [id number] Treatment Protocol: 2.5 ATA with 90 minutes oxygen, with two 5 minute air breaks Treatment Details Compression Rate Down: 1.5 psi / minute De-Compression Rate Up: 1.5 psi / minute A breaks and breathing ir Compress Tx Pressure periods Decompress Decompress Begins Reached (leave unused spaces Begins Ends blank) Chamber Pressure (ATA 1 2.5 2.5 2.5 2.5 2.5 - - 2.5 1 ) Clock Time (24 hr) 10:25 10:43 11:13 11:18 11:48 11:53 - - 12:23 12:38 Treatment Length: 133 (minutes) Treatment Segments: 4 Vital Signs Capillary Blood Glucose Reference Range: 80 - 120 mg / dl HBO Diabetic Blood Glucose Intervention Range: <131 mg/dl or >812 mg/dl Type: Time Vitals Blood Pulse: Respiratory Capillary Blood Glucose Pulse Action Temperature: Taken: Pressure: Rate: Glucose (mg/dl): Meter #: Oximetry (%) Taken: Pre 10:16 129/79 107 12 97.7 149 2 Post 12:42 125/63 76 14 97.3 120 2 discharge per protocol Treatment Response Treatment Toleration: Well Treatment Completion Status: Treatment Completed without Adverse Event Sadao Weyer Notes No concerns with treatment given Physician HBO Attestation: I certify that I supervised  this HBO treatment in accordance with Medicare guidelines. A trained emergency response team is readily available per Yes hospital policies and procedures. Continue HBOT as ordered. Yes Electronic Signature(s) Signed: 07/09/2021 4:58:28 PM By: Baltazar Najjar MD Previous Signature: 07/09/2021 1:13:53 PM Version By: Haywood Pao EMT Entered By: Baltazar Najjar on 07/09/2021 16:56:54 -------------------------------------------------------------------------------- HBO Safety Checklist Details Patient Name: Date of Service: [name] 07/09/2021 10:00 A M Medical Record Number: [id number] Patient Account Number: [id number] Date of Birth/Sex: Treating RN: [birth year] (60 y.o. [name], [name] Primary Care Jahmiya Guidotti: [name] Other Clinician: Haywood Pao Referring Zaide Mcclenahan: Treating Cashmere Dingley/Extender: Avie Echevaria in Treatment: 5 HBO Safety Checklist Items Safety Checklist Consent Form Signed Patient voided / foley secured and emptied When did you last eato 0900 Last dose of injectable or oral agent n/a Ostomy pouch emptied and vented if applicable NA All implantable devices assessed, documented and approved NA Intravenous access site secured and place NA Valuables secured Linens and cotton and cotton/polyester blend (less than 51% polyester) Personal oil-based products / skin lotions / body lotions removed Wigs or hairpieces removed NA Smoking or tobacco materials removed NA Books / newspapers / magazines / loose paper removed Cologne, aftershave, perfume and deodorant removed Jewelry removed (may wrap wedding band) Make-up removed NA Hair care products removed Hair Elastic Battery operated devices (external) removed Heating patches and chemical warmers removed Titanium eyewear removed NA Nail polish cured greater than 10 hours NA Casting material cured greater than 10 hours NA Hearing aids removed NA Loose dentures or partials  removed NA Prosthetics have been removed NA Patient demonstrates correct use of air break device (if applicable) Patient concerns have been addressed Patient grounding bracelet on and cord attached to chamber Specifics for Inpatients (complete in addition to above) Medication sheet sent with patient NA Intravenous medications needed  or due during therapy sent with patient NA Drainage tubes (e.g. nasogastric tube or chest tube secured and vented) NA Endotracheal or Tracheotomy tube secured NA Cuff deflated of air and inflated with saline NA Airway suctioned NA Notes Paper version of safety checklist used prior to treatment start. Data entered afterward. Electronic Signature(s) Signed: 07/09/2021 1:13:53 PM By: Haywood Pao EMT Entered By: Haywood Pao on 07/09/2021 13:13:15

## 2021-07-09 NOTE — Progress Notes (Signed)
BADER, STUBBLEFIELD ([id number]) Visit Report for 07/08/2021 HBO Details Patient Name: Date of Service: [name], [name] 07/08/2021 10:00 A M Medical Record Number: [id number] Patient Account Number: [id number] Date of Birth/Sex: Treating RN: 04-13-1961 (60 y.o. [name] Primary Care Sukhdeep Wieting: [name] Other Clinician: Haywood Pao Referring Renezmae Canlas: Treating Kelsie Kramp/Extender: [name] in Treatment: 4 HBO Treatment Course Details Treatment Course Number: 1 Ordering Catilyn Boggus: Baltazar Najjar T Treatments Ordered: otal 40 HBO Treatment Start Date: 07/07/2021 HBO Indication: Chronic Refractory Osteomyelitis to Left,Plantar Foot HBO Treatment Details Treatment Number: 2 Patient Type: Outpatient Chamber Type: Monoplace Chamber Serial #: [id number] Treatment Protocol: 2.5 ATA with 90 minutes oxygen, with two 5 minute air breaks Treatment Details Compression Rate Down: 1.0 psi / minute De-Compression Rate Up: 1.5 psi / minute A breaks and breathing ir Compress Tx Pressure periods Decompress Decompress Begins Reached (leave unused spaces Begins Ends blank) Chamber Pressure (ATA 1 2.5 2.5 2.5 2.5 2.5 - - 2.5 1 ) Clock Time (24 hr) 10:37 10:59 11:29 11:34 12:04 12:09 - - 12:39 12:54 Treatment Length: 137 (minutes) Treatment Segments: 5 Vital Signs Capillary Blood Glucose Reference Range: 80 - 120 mg / dl HBO Diabetic Blood Glucose Intervention Range: <131 mg/dl or >086 mg/dl Time Vitals Blood Respiratory Capillary Blood Glucose Pulse Action Type: Pulse: Temperature: Taken: Pressure: Rate: Glucose (mg/dl): Meter #: Oximetry (%) Taken: Pre 10:17 106/66 97 16 97.8 155 Post 12:58 112/67 78 16 97.6 123 Treatment Response Treatment Toleration: Well Treatment Completion Status: Treatment Completed without Adverse Event Electronic Signature(s) Signed: 07/09/2021 11:15:22 AM By: Haywood Pao EMT Signed: 07/09/2021 2:21:00 PM By: Lenda Kelp  PA-C Entered By: Haywood Pao on 07/08/2021 13:22:08 -------------------------------------------------------------------------------- HBO Safety Checklist Details Patient Name: Date of Service: [name] 07/08/2021 10:00 A M Medical Record Number: [id number] Patient Account Number: [id number] Date of Birth/Sex: Treating RN: 1961-09-22 (60 y.o. [name] Primary Care Cedra Villalon: [name] Other Clinician: Haywood Pao Referring Everly Rubalcava: Treating Alejandro Adcox/Extender: [name] in Treatment: 4 HBO Safety Checklist Items Safety Checklist Consent Form Signed Patient voided / foley secured and emptied When did you last eato 0900 Last dose of injectable or oral agent n/a Ostomy pouch emptied and vented if applicable NA All implantable devices assessed, documented and approved NA Intravenous access site secured and place NA Valuables secured Linens and cotton and cotton/polyester blend (less than 51% polyester) Personal oil-based products / skin lotions / body lotions removed Wigs or hairpieces removed NA Smoking or tobacco materials removed NA Books / newspapers / magazines / loose paper removed Cologne, aftershave, perfume and deodorant removed Jewelry removed (may wrap wedding band) NA Make-up removed NA Hair care products removed Battery operated devices (external) removed Heating patches and chemical warmers removed Titanium eyewear removed NA Nail polish cured greater than 10 hours NA Casting material cured greater than 10 hours NA Hearing aids removed NA Loose dentures or partials removed NA Prosthetics have been removed NA Patient demonstrates correct use of air break device (if applicable) Patient concerns have been addressed Patient grounding bracelet on and cord attached to chamber Specifics for Inpatients (complete in addition to above) Medication sheet sent with patient NA Intravenous medications needed or  due during therapy sent with patient NA Drainage tubes (e.g. nasogastric tube or chest tube secured and vented) NA Endotracheal or Tracheotomy tube secured NA Cuff deflated of air and inflated with saline NA Airway suctioned NA Notes Paper version used prior to treatment start. Data  entered afterward. Electronic Signature(s) Signed: 07/09/2021 11:15:22 AM By: Haywood Pao EMT Entered By: Haywood Pao on 07/08/2021 11:16:48

## 2021-07-10 ENCOUNTER — Encounter (HOSPITAL_BASED_OUTPATIENT_CLINIC_OR_DEPARTMENT_OTHER): Payer: Medicare Other | Admitting: Internal Medicine

## 2021-07-10 DIAGNOSIS — L97528 Non-pressure chronic ulcer of other part of left foot with other specified severity: Secondary | ICD-10-CM

## 2021-07-10 DIAGNOSIS — M14672 Charcot's joint, left ankle and foot: Secondary | ICD-10-CM

## 2021-07-10 DIAGNOSIS — M86372 Chronic multifocal osteomyelitis, left ankle and foot: Secondary | ICD-10-CM | POA: Diagnosis not present

## 2021-07-10 DIAGNOSIS — E11621 Type 2 diabetes mellitus with foot ulcer: Secondary | ICD-10-CM | POA: Diagnosis not present

## 2021-07-10 LAB — GLUCOSE, CAPILLARY
Glucose-Capillary: 131 mg/dL — ABNORMAL HIGH (ref 70–99)
Glucose-Capillary: 132 mg/dL — ABNORMAL HIGH (ref 70–99)

## 2021-07-12 ENCOUNTER — Other Ambulatory Visit: Payer: Self-pay | Admitting: Family Medicine

## 2021-07-13 ENCOUNTER — Encounter (HOSPITAL_BASED_OUTPATIENT_CLINIC_OR_DEPARTMENT_OTHER): Payer: Medicare Other | Admitting: Internal Medicine

## 2021-07-14 ENCOUNTER — Encounter (HOSPITAL_BASED_OUTPATIENT_CLINIC_OR_DEPARTMENT_OTHER): Payer: Medicare Other | Admitting: Internal Medicine

## 2021-07-14 ENCOUNTER — Other Ambulatory Visit: Payer: Self-pay

## 2021-07-14 DIAGNOSIS — E11621 Type 2 diabetes mellitus with foot ulcer: Secondary | ICD-10-CM | POA: Diagnosis not present

## 2021-07-14 LAB — GLUCOSE, CAPILLARY
Glucose-Capillary: 132 mg/dL — ABNORMAL HIGH (ref 70–99)
Glucose-Capillary: 134 mg/dL — ABNORMAL HIGH (ref 70–99)

## 2021-07-14 NOTE — Progress Notes (Signed)
[name], [name] ([id number]) Visit Report for 07/10/2021 HBO Details Patient Name: Date of Service: [name], [name] 07/10/2021 10:00 A M Medical Record Number: [id number] Patient Account Number: [id number] Date of Birth/Sex: Treating RN: 06/01/1961 (60 y.o. M) Primary Care [name]: [name] Other Clinician: Haywood [name] Referring [name]: Treating [name]/Extender: [name] in Treatment: 5 HBO Treatment Course Details Treatment Course Number: 1 Ordering [name]: [name] Treatments Ordered: otal 40 HBO Treatment Start Date: 07/07/2021 HBO Indication: Chronic Refractory Osteomyelitis to Left,Plantar Foot HBO Treatment Details Treatment Number: 4 Patient Type: Outpatient Chamber Type: Monoplace Chamber Serial #: [id number] Treatment Protocol: 2.5 ATA with 90 minutes oxygen, with two 5 minute air breaks Treatment Details Compression Rate Down: 1.5 psi / minute De-Compression Rate Up: 2.0 psi / minute A breaks and breathing ir Compress Tx Pressure periods Decompress Decompress Begins Reached (leave unused spaces Begins Ends blank) Chamber Pressure (ATA 1 2.5 2.5 2.5 2.5 2.5 - - 2.5 1 ) Clock Time (24 hr) 10:30 10:43 11:13 11:18 11:48 11:53 - - 12:23 12:34 Treatment Length: 124 (minutes) Treatment Segments: 4 Vital Signs Capillary Blood Glucose Reference Range: 80 - 120 mg / dl HBO Diabetic Blood Glucose Intervention Range: <131 mg/dl or >330 mg/dl Time Vitals Blood Respiratory Capillary Blood Glucose Pulse Action Type: Pulse: Temperature: Taken: Pressure: Rate: Glucose (mg/dl): Meter #: Oximetry (%) Taken: Pre 10:16 128/78 101 12 97.8 131 Post 12:40 141/82 86 14 97.6 132 Treatment Response Treatment Toleration: Well Treatment Completion Status: Treatment Completed without Adverse Event Physician HBO Attestation: I certify that I supervised this HBO treatment in accordance with Medicare guidelines. A trained emergency response  team is readily available per Yes hospital policies and procedures. Continue HBOT as ordered. Yes Electronic Signature(s) Signed: 07/14/2021 9:17:13 AM By: [name] Corwin DO Signed: 07/14/2021 12:11:19 PM By: [name] EMT Previous Signature: 07/10/2021 12:57:26 PM Version By: [name] Corwin DO Entered By: [name] on 07/10/2021 13:13:02 -------------------------------------------------------------------------------- HBO Safety Checklist Details Patient Name: Date of Service: [name] IG 07/10/2021 10:00 A M Medical Record Number: [id number] Patient Account Number: [id number] Date of Birth/Sex: Treating RN: 04/04/1961 (60 y.o. M) Primary Care Eddrick Dilone: [name] Other Clinician: Haywood [name] Referring Alleene Stoy: Treating Dandrea Medders/Extender: [name] in Treatment: 5 HBO Safety Checklist Items Safety Checklist Consent Form Signed Patient voided / foley secured and emptied When did you last eato 0915 Last dose of injectable or oral agent n/a Ostomy pouch emptied and vented if applicable NA All implantable devices assessed, documented and approved NA Intravenous access site secured and place NA Valuables secured Linens and cotton and cotton/polyester blend (less than 51% polyester) Personal oil-based products / skin lotions / body lotions removed Wigs or hairpieces removed NA Smoking or tobacco materials removed NA Books / newspapers / magazines / loose paper removed Cologne, aftershave, perfume and deodorant removed Jewelry removed (may wrap wedding band) Make-up removed NA Hair care products removed Hair Elastic Battery operated devices (external) removed Heating patches and chemical warmers removed Titanium eyewear removed NA Nail polish cured greater than 10 hours NA Casting material cured greater than 10 hours NA Hearing aids removed NA Loose dentures or partials removed NA Prosthetics have been  removed NA Patient demonstrates correct use of air break device (if applicable) Patient concerns have been addressed Patient grounding bracelet on and cord attached to chamber Specifics for Inpatients (complete in addition to above) Medication sheet sent with patient NA Intravenous medications needed or due during therapy sent with patient NA  Drainage tubes (e.g. nasogastric tube or chest tube secured and vented) NA Endotracheal or Tracheotomy tube secured NA Cuff deflated of air and inflated with saline NA Airway suctioned NA Notes Paper version used prior to treatment. Data entered afterward. Electronic Signature(s) Signed: 07/14/2021 12:11:19 PM By: [name] EMT Entered By: [name] on 07/10/2021 13:08:20

## 2021-07-14 NOTE — Progress Notes (Signed)
[name], [name] ([id number]) Visit Report for 07/14/2021 SuperBill Details Patient Name: Date of Service: [name], [name] 07/14/2021 Medical Record Number: [id number] Patient Account Number: [id number] Date of Birth/Sex: Treating RN: May 12, 1961 (60 y.o. Charlean Merl, Lauren Primary Care Provider: [name] Other Clinician: Haywood Pao Referring Provider: Treating Provider/Extender: Avie Echevaria in Treatment: 5 Diagnosis Coding ICD-10 Codes Code Description [phone] Type 2 diabetes mellitus with foot ulcer M14.672 Charcot's joint, left ankle and foot L97.528 Non-pressure chronic ulcer of other part of left foot with other specified severity M86.372 Chronic multifocal osteomyelitis, left ankle and foot Facility Procedures CPT4 Code Description Modifier Quantity [id number] G0277-(Facility Use Only) HBOT full body chamber, , 4 ICD-10 Diagnosis Description E11.621 Type 2 diabetes mellitus with foot ulcer M14.672 Charcot's joint, left ankle and foot L97.528 Non-pressure chronic ulcer of other part of left foot with other specified severity M86.372 Chronic multifocal osteomyelitis, left ankle and foot Physician Procedures Quantity CPT4 Code Description Modifier [id number] - WC PHYS HYPERBARIC OXYGEN THERAPY 1 ICD-10 Diagnosis Description E11.621 Type 2 diabetes mellitus with foot ulcer M14.672 Charcot's joint, left ankle and foot L97.528 Non-pressure chronic ulcer of other part of left foot with other specified severity M86.372 Chronic multifocal osteomyelitis, left ankle and foot Electronic Signature(s) Signed: 07/14/2021 12:11:19 PM By: Haywood Pao EMT Signed: 07/14/2021 4:21:38 PM By: Baltazar Najjar MD Entered By: Haywood Pao on 07/14/2021 11:38:29

## 2021-07-14 NOTE — Progress Notes (Signed)
[name], TENNIS ([id number]) Visit Report for 07/10/2021 Arrival Information Details Patient Name: Date of Service: [name], [name] 07/10/2021 10:00 A M Medical Record Number: [id number] Patient Account Number: [id number] Date of Birth/Sex: Treating RN: [birth year] [name] (60 y.o. M) Primary Care Halleigh Comes: [name] Other Clinician: Haywood Pao Referring Dmonte Maher: Treating Darnell Jeschke/Extender: [name] in Treatment: 5 Visit Information History Since Last Visit All ordered tests and consults were completed: Yes Patient Arrived: [name] Added or deleted any medications: No Arrival Time: 10:10 Any new allergies or adverse reactions: No Accompanied By: self Had a fall or experienced change in No Transfer Assistance: None activities of daily living that [name] affect Patient Identification Verified: Yes risk of falls: Secondary Verification Process Completed: Yes Signs or symptoms of abuse/neglect since last visito No Patient Requires Transmission-Based Precautions: No Hospitalized since last visit: No Patient Has Alerts: Yes Implantable device outside of the clinic excluding No Patient Alerts: L ABI: 0.99 cellular tissue based products placed in the center R ABI: 1.05 since last visit: Has Dressing in Place as Prescribed: Yes Pain Present Now: No Electronic Signature(s) Signed: 07/14/2021 12:11:19 PM By: Haywood Pao EMT Entered By: Haywood Pao on 07/10/2021 11:23:22 -------------------------------------------------------------------------------- Encounter Discharge Information Details Patient Name: Date of Service: [name] 07/10/2021 10:00 A M Medical Record Number: [id number] Patient Account Number: [id number] Date of Birth/Sex: Treating RN: [name][birth year] (60 y.o. M) Primary Care Ranata Laughery: [name] Other Clinician: Haywood Pao Referring Cameryn Chrisley: Treating Hafsa Lohn/Extender: [name] in Treatment:  5 Encounter Discharge Information Items Discharge Condition: Stable Ambulatory Status: Walker Discharge Destination: Home Transportation: Private Auto Accompanied By: self Schedule Follow-up Appointment: No Clinical Summary of Care: Electronic Signature(s) Signed: 07/14/2021 12:11:19 PM By: Haywood Pao EMT Entered By: Haywood Pao on 07/10/2021 13:07:54 -------------------------------------------------------------------------------- Vitals Details Patient Name: Date of Service: [name] 07/10/2021 10:00 A M Medical Record Number: [id number] Patient Account Number: [id number] Date of Birth/Sex: Treating RN: [name] (60 y.o. M) Primary Care Luke Rigsbee: [name] Other Clinician: Haywood Pao Referring Leahmarie Gasiorowski: Treating Junella Domke/Extender: [name] in Treatment: 5 Vital Signs Time Taken: 10:16 Temperature (F): 97.8 Height (in): 73 Pulse (bpm): 101 Weight (lbs): 270 Respiratory Rate (breaths/min): 12 Body Mass Index (BMI): 35.6 Blood Pressure (mmHg): 128/78 Capillary Blood Glucose (mg/dl): 498 Reference Range: 80 - 120 mg / dl Electronic Signature(s) Signed: 07/14/2021 12:11:19 PM By: Haywood Pao EMT Entered By: Haywood Pao on 07/10/2021 11:24:30

## 2021-07-15 ENCOUNTER — Ambulatory Visit: Payer: Medicare Other | Admitting: Infectious Disease

## 2021-07-15 ENCOUNTER — Encounter (HOSPITAL_BASED_OUTPATIENT_CLINIC_OR_DEPARTMENT_OTHER): Payer: Medicare Other | Admitting: Physician Assistant

## 2021-07-15 NOTE — Progress Notes (Signed)
[name], [name] ([id number]) Visit Report for 07/10/2021 SuperBill Details Patient Name: Date of Service: [name], [name] 07/10/2021 Medical Record Number: [id number] Patient Account Number: [id number] Date of Birth/Sex: Treating RN: 25-Feb-1961 (60 y.o. M) Primary Care Provider: [name] Other Clinician: Haywood Pao Referring Provider: Treating Provider/Extender: [name] in Treatment: 5 Diagnosis Coding ICD-10 Codes Code Description E11.621 Type 2 diabetes mellitus with foot ulcer M14.672 Charcot's joint, left ankle and foot L97.528 Non-pressure chronic ulcer of other part of left foot with other specified severity M86.372 Chronic multifocal osteomyelitis, left ankle and foot Facility Procedures CPT4 Code Description Modifier Quantity [id number] G0277-(Facility Use Only) HBOT full body chamber, , 4 ICD-10 Diagnosis Description E11.621 Type 2 diabetes mellitus with foot ulcer M14.672 Charcot's joint, left ankle and foot L97.528 Non-pressure chronic ulcer of other part of left foot with other specified severity M86.372 Chronic multifocal osteomyelitis, left ankle and foot Physician Procedures Quantity CPT4 Code Description Modifier [id number] - WC PHYS HYPERBARIC OXYGEN THERAPY 1 ICD-10 Diagnosis Description E11.621 Type 2 diabetes mellitus with foot ulcer M14.672 Charcot's joint, left ankle and foot L97.528 Non-pressure chronic ulcer of other part of left foot with other specified severity M86.372 Chronic multifocal osteomyelitis, left ankle and foot Electronic Signature(s) Signed: 07/15/2021 11:07:05 AM By: Haywood Pao EMT Signed: 07/15/2021 1:59:54 PM By: [name] DO Entered By: Haywood Pao on 07/15/2021 11:03:10

## 2021-07-15 NOTE — Progress Notes (Signed)
[name], [name] ([id number]) Visit Report for 07/14/2021 Debridement Details Patient Name: Date of Service: [name], [name] 07/14/2021 10:15 A M Medical Record Number: [id number] Patient Account Number: [id number] Date of Birth/Sex: Treating RN: 03-Mar-1961 (60 y.o. [name] Primary Care Provider: [name] Other Clinician: Referring Provider: Treating Provider/Extender: Avie Echevaria in Treatment: 5 Debridement Performed for Assessment: Wound #3 Left,Plantar Foot Performed By: Physician Maxwell Caul., MD Debridement Type: Debridement Severity of Tissue Pre Debridement: Fat layer exposed Level of Consciousness (Pre-procedure): Awake and Alert Pre-procedure Verification/Time Out Yes - 11:17 Taken: Start Time: 11:18 T Area Debrided (L x W): otal 1.8 (cm) x 2.4 (cm) = 4.32 (cm) Tissue and other material debrided: Non-Viable, Callus, Subcutaneous, Skin: Dermis Level: Skin/Subcutaneous Tissue Debridement Description: Excisional Instrument: Curette Bleeding: Moderate Hemostasis Achieved: Silver Nitrate End Time: 11:23 Response to Treatment: Procedure was tolerated well Level of Consciousness (Post- Awake and Alert procedure): Post Debridement Measurements of Total Wound Length: (cm) 1.8 Width: (cm) 2.4 Depth: (cm) 0.2 Volume: (cm) 0.679 Character of Wound/Ulcer Post Debridement: Stable Severity of Tissue Post Debridement: Fat layer exposed Post Procedure Diagnosis Same as Pre-procedure Electronic Signature(s) Signed: 07/14/2021 4:21:38 PM By: Baltazar Najjar MD Signed: 07/15/2021 5:21:18 PM By: Fonnie Mu RN Entered By: Baltazar Najjar on 07/14/2021 12:12:03 -------------------------------------------------------------------------------- HPI Details Patient Name: Date of Service: [name] 07/14/2021 10:15 A M Medical Record Number: [id number] Patient Account Number: [id number] Date of Birth/Sex: Treating RN: September 04, 1961 (60 y.o.  [name] Primary Care Provider: [name] Other Clinician: Referring Provider: Treating Provider/Extender: Avie Echevaria in Treatment: 5 History of Present Illness HPI Description: 10/31/2019 upon evaluation today patient appears to be doing somewhat poorly in regard to his bilateral plantar feet. He has wounds that he tells me have been present since 2012 intermittently off and on. Most recently this has been open for at least the past 6 months to a year. He has been trying to treat this in different ways using Santyl along with various other dressings including Medihoney and even at one point Xeroform. Nothing really has seem to get this completely closed. He was recently in the hospital for cellulitis of his leg subsequently he did have x-rays as well as MRIs that showed negative for any signs of osteomyelitis in regard to the wounds on his feet. Fortunately there is no signs of systemic infection at this time. No fevers, chills, nausea, vomiting, or diarrhea. Patient has previously used Darco offloading shoes as far as frontal floaters as well as postop surgical shoes. He has never been in a total contact cast that may be something we need to strongly consider here. Patient's most recent hemoglobin A1c 1 month ago was 5.3 seems to be very well controlled which is great. Subsequently he has seen vascular as well as podiatry. His ABIs are 1.07 on the left and 1.14 on the right he seems to be doing well he does have chronic venous stasis. 11/07/2019 upon evaluation today patient appears to be doing well with regard to his wounds all things considered. I do not see any severe worsening he still has some callus buildup on the right more than the left he notes that he has been probably more active than he should as far as walking is concerned is just very hard to not be active. He knows he needs to be more careful in this regard however. He is willing to give the  cast a try at this point although he  notes that he is a little nervous about this just with regard to balance although he will be very careful and obviously if he has any trouble he knows to contact the office and let me know. 1/22; patient is in for his obligatory first total contact cast change. Our intake nurse reported a very large amount of drainage which is spelled out over to the surrounding skin. Has bilateral diabetic foot wounds. He has Charcot feet. We have been using silver alginate on his wounds. 11/14/2019 on evaluation today patient is actually seeming to make good improvement in regard to his bilateral plantar foot wounds. We have been using a cast on the left side and on the right side he has been using dressings he is changing up his own accord. With that being said he tells me that he is also not walking as much just due to how unsteady he feels. He takes it easy when he does have to walk and when he does not have to walk he is resting. This is probably help in his right foot as well has the left foot which is actually measuring better. In fact both are measuring better. Overall I am very pleased with how things seem to be progressing. The patient does have some odor on the left foot this does have me concerned about the possibility of infection, and actually probably go ahead and put him on antibiotics today as well as utilizing a continuation of the cast on the left foot I think that will be fine we probably just need to bring him in sooner to change this not last a whole week. 1/29; we brought the patient back today for a total contact cast change on the left out of concern for excessive drainage. We are using drawtex over the wound as the primary dressing 11/21/2019 on evaluation today patient appears to be doing well with regard to his left plantar foot. In fact both foot ulcers actually seem to be doing pretty well. Nonetheless he is having a lot of drainage on the left at this  time and again we did obtain a wound culture did show positive for Staph aureus that was reviewed by myself today as well. Nonetheless he is on Bactrim which was shown to be sensitive that should be helping in this regard. Fortunately there is no signs of infection systemically at this point. 2/5; back in clinic today for a total contact cast change apparently secondary to very significant drainage. Still using drawtex 11/28/2019 upon evaluation today patient appears to be doing well with regard to his wounds. The right foot is doing okay as measured about the same in my opinion. The left foot is actually showing signs of significant improvement is measuring smaller there is a lot of hyper granulation likely due to the continued drainage at this point. We did obtain approval for a snap VAC I think that is good to be appropriate for him and will likely help this tremendously underneath the cast. He is definitely in agreement with proceeding with such. 2/12; patient came in today after his snap VAC lost suction. Brought in to see one of our nurses. The dressing was replaced and then we put the cast back on and rehooked up the snap VAC. Apparently his wound looked very good per our intake nurse. 2/15; again we replaced the cast on Friday. By Saturday the snap VAC and light suction. He called this morning he comes in acutely. The wounds look fine however the [employer]  is not functioning. We replaced the cast using silver alginate as the primary dressing backed with Kerramax. The snap VAC was not replaced 12/05/2019 upon evaluation today patient appears to be doing better in regard to his left plantar foot ulcer. Fortunately there is no signs of active infection at this time. Unfortunately he is continuing to have issues with the right foot he is really not making any progress here things seem to be somewhat stagnant to be honest. The depth has increased but that is due to me having debrided the wound in the past  based on what I am seeing. 12/12/2019 on evaluation today patient appears to be doing more poorly in regard to the left lower extremity. He has some erythema spreading up the side of his foot I am concerned about infection again at this point. Unfortunately he has been seeing improvement with a total contact cast but I do not think we should put that on today. On his right plantar foot he continues to have significant drainage this is actually measuring deeper I really do not feel like you are making any progress whatsoever. I have prescribed Granix for him unfortunately his insurance apparently was going to cost him a $500 co-pay. 12/19/2019 upon evaluation today patient actually appears to be doing better in regard to both wounds. With that being said he actually did get the reGranix which he had to pay $500 for. With that being said it does look like that he is actually made some improvement based on what I am seeing at this point with the reGranix. Obviously if he is going to continue this we are going to do something about trying to get him some help in covering the cost. 12/26/2019 on evaluation today patient appears to be doing really much better even compared to last week. Overall the wound seems to be much better even compared to last week and last week was better than the week before. Since has been using the reGranix his symptoms have improved significantly. With that being said the issue right now is simply that this is a very expensive medication for him the first dose cost him $500. Upon inspection patient's wound bed actually is however dramatically improved compared to before he started this 2 weeks ago. 01/02/2020 upon evaluation today patient appears to actually be doing well. He still had a little bit of reGranix left that has been using in small amounts he just been applying it every other day instead of every day in order to make it go longer. Overall we are still seeing excellent  improvement he is measuring smaller looking better healthier tissue and everything seems to be pointing to this headed in the right direction. Fortunately there is no evidence of infection either which is also excellent news. He does have his MRI coming up within the next week. 01/09/2020 upon evaluation today patient appears to be doing a little worse today compared to previous week's evaluation. He is actually been out of the reGranix at this point. He has been trying to make the stretch out so he has been changing the dressings on a regular set schedule like he was previous. I think this has made a difference. Fortunately there is no signs of active infection at this time. No fevers, chills, nausea, vomiting, or diarrhea. 01/16/2020 upon evaluation today patient appears to be doing well with regard to his left plantar foot ulcer. The right plantar foot still shows some significant depth at this point. Fortunately there is  no signs of active infection at this time. 01/30/2020 upon evaluation today patient appears to be doing about the same in regard to his right plantar foot ulcer there is still some depth here and we had to wait till he actually switched over to his new insurance to get approval for the MRI under his new insurance plan. With that being said he now has switched as of April 1. Fortunately there is no signs of active infection at this time. Overall in regard to his left foot ulcer this seems to be doing much better and I am actually very pleased with how things are going. With that being said it is not quite as much progress as we were seeing with the reGranix but at the same time he has had trouble getting this apparently there is been some hindrance here. I Ernie Hew try to actually send this to [facility] that was recommended by the drug rep to me. 02/13/20 upon evaluation today patient appears to be doing better in regard to his left foot ulcer this is great news. Unfortunately the  right foot ulcer is not really significantly better at this time. There is no signs of active infection systemically though he did have his MRI which showed unfortunately he does have infection noted including an abscess in the foot. There is also marrow changes noted which are consistent with osteomyelitis based on the radiology review and interpretation. Unfortunately considering that the wound is not really making the progress that we will he would like to be seen I think that this is an indication that he may need some further referral both infectious disease as well as potentially to podiatrist to see if there is anything that can be done to help with the situation that were dealing with here. The left foot again is doing great. READMISSION 06/04/2021 This is a 60 year old man who was in the clinic in 2021 followed by Allen Derry for areas on the right and left foot. He developed a left foot infection and was referred to ID. He left the clinic in a nonhealed state and was followed for a period of [facility] Marylene Land. Apparently things really deteriorated in early July when he was admitted to hospital from 04/27/2021 through 05/06/2021 with sepsis secondary to a left foot infection. His blood cultures were negative. An MRI suggested fifth metatarsal osteomyelitis a left ankle septic joint. He was treated with vancomycin and ertapenem which he is still taking and may just about be finishing. He was seen by orthopedics and the patient adamantly refused to BKA. As far as I can tell he did not have the ankle aspirated I am not exactly sure what the issue was here. Since he has been discharged she is at [facility] health care for rehabilitation. He was last seen by Dr. Algis Liming on 05/20/2021 he noted osteo of the tibial talar bone cuboid and fifth metatarsal which is even more extensive than what was suggested by the MRI. He is apparently going for a consultation with orthopedic surgery in  [place] sometime next week. I received a call about this man 2 weeks ago from Dr. Allyson Sabal who follows him for the possibility of PAD. ABIs I think done in the office showed a ABI on the right of 1.05 at the PTA and 0.99 at the PTA on the left. He had a DVT rule out in the left leg that was negative for the DVT. Past medical history is extensive and includes diastolic  heart failure, right first metatarsal head ulcer in 2021, excision of the right second ray by Dr. Marylene Land on 03/13/2020, hypertension, hypothyroidism. Left total hip replacement, right total knee replacement, carpal tunnel syndrome, obstructive sleep apnea alcohol abuse with cirrhosis although the patient denies current alcohol intake. The patient does not think he is a diabetic however looking through [facility] link I see 2 HgbAic's of this year that were greater than equal to 6.5 which by definition makes him diabetic. Nevertheless he is not on any treatment and does not check his blood sugars. The patient is now back home out of the nursing home. Saw Dr. Algis Liming last week he was taken off vancomycin and ertapenem on August 22 and now is on doxycycline on Augmentin. He also saw Dr. Weston Anna who is his orthopedic surgeon in [place] he recommended a [employer]	[place]. He has been using Medihoney. 9/6I have been having trouble getting hyperbarics approved through our prior authorization process. Even though he had a limb threatening infection in the left foot and probably the left ankle there glitches in how some of the reports are worded also some of the consultants. In any case I am going to repeat his sedimentation rate and C-reactive protein. I am generally not in favor of doing things like this as they really do not alter the plan of care from my point of view however I am going to need to demonstrate that these remain high in order to get this through forhyperbaric treatment for chronic refractory osteomyelitis 9/13; following this man for  a wound on his left plantar foot in the setting of type 2 diabetes and Charcot deformity. He has underlying chronic refractory osteomyelitis. Follow-up sedimentation rate and C-reactive protein were both elevated but the C-reactive protein was down to 1.4, sedimentation rate at 70. Sedimentation rate was only slightly down from previous at 85. His wound is measuring slightly smaller. 9/20; patient started hyperbaric oxygen therapy today and tolerated treatment well. This is for the underlying osteomyelitis. He remains on antibiotics but thinks he is getting close to finishing. The wound on the plantar aspect of his foot is the other issue we are following here. He is using Medihoney The patient has a Charcot foot in the setting of type 2 diabetes. He is going to need a total contact cast although his partner was away this week and we elected to delay this till next week 9/27; patient still tolerating hyperbaric oxygen well. Wound looked generally healthy not much depth under illumination still 100% covered in fibrinous debris. Raised callused edges around the wound he was prepared for a total contact cast. We have been using Medihoney Electronic Signature(s) Signed: 07/14/2021 4:21:38 PM By: Baltazar Najjar MD Entered By: Baltazar Najjar on 07/14/2021 12:13:33 -------------------------------------------------------------------------------- Physical Exam Details Patient Name: Date of Service: [name] 07/14/2021 10:15 A M Medical Record Number: [id number] Patient Account Number: [id number] Date of Birth/Sex: Treating RN: 1960/11/17 (60 y.o. [name] Primary Care Provider: [name] Other Clinician: Referring Provider: Treating Provider/Extender: Avie Echevaria in Treatment: 5 Constitutional Sitting or standing Blood Pressure is within target range for patient.. Pulse regular and within target range for patient.Marland Kitchen Respirations regular, non-labored  and within target range.. Temperature is normal and within the target range for the patient.Marland Kitchen Appears in no distress. Notes Wound exam; mid part of the left plantar foot. Under illumination and nonviable surface and thick skin and callus around the wound and aggressive debridement with a #5 curette to  remove the skin and callus give me a viable edge. Also very fibrinous surface over the wound also aggressively debrided hemostasis with direct pressure. Afterwards total contact cast applied in the standard fashion Electronic Signature(s) Signed: 07/14/2021 4:21:38 PM By: Baltazar Najjar MD Entered By: Baltazar Najjar on 07/14/2021 12:15:49 -------------------------------------------------------------------------------- Physician Orders Details Patient Name: Date of Service: [name] 07/14/2021 10:15 A M Medical Record Number: [id number] Patient Account Number: [id number] Date of Birth/Sex: Treating RN: October 25, 1960 (60 y.o. [name] Primary Care Provider: [name] Other Clinician: Referring Provider: Treating Provider/Extender: Avie Echevaria in Treatment: 5 Verbal / Phone Orders: No Diagnosis Coding ICD-10 Coding Code Description E11.621 Type 2 diabetes mellitus with foot ulcer M14.672 Charcot's joint, left ankle and foot L97.528 Non-pressure chronic ulcer of other part of left foot with other specified severity M86.372 Chronic multifocal osteomyelitis, left ankle and foot Follow-up Appointments ppointment in 1 week. - Tuesday (07/21/21) with Dr. Leanord Hawking Return A Friday (07/17/21) for cast change Bathing/ Shower/ Hygiene May shower and wash wound with soap and water. - prior to dressing change Edema Control - Lymphedema / SCD / Other Elevate legs to the level of the heart or above for 30 minutes daily and/or when sitting, a frequency of: - throughout the day Avoid standing for long periods of time. Exercise regularly Moisturize legs  daily. Off-Loading Total Contact Cast to Left Lower Extremity - Size 4 Additional Orders / Instructions Follow Nutritious Diet Hyperbaric Oxygen Therapy Wound #3 Left,Plantar Foot Evaluate for HBO Therapy - Starts 07/07/21 Indication: - Chronic Refractory Osteomyelitis 2.5 ATA for 90 Minutes with 2 Five (5) Minute A Breaks ir Total Number of Treatments: - 40 One treatments per day (delivered Monday through Friday unless otherwise specified in Special Instructions below): Finger stick Blood Glucose Pre- and Post- HBOT Treatment. Follow Hyperbaric Oxygen Glycemia Protocol Afrin (Oxymetazoline HCL) 0.05% nasal spray - 1 spray in both nostrils daily as needed prior to HBO treatment for difficulty clearing ears Wound Treatment Wound #3 - Foot Wound Laterality: Plantar, Left Cleanser: Soap and Water 1 x Per Day Discharge Instructions: May shower and wash wound with dial antibacterial soap and water prior to dressing change. Cleanser: Wound Cleanser 1 x Per Day Discharge Instructions: Cleanse the wound with wound cleanser prior to applying a clean dressing using gauze sponges, not tissue or cotton balls. Prim Dressing: Hydrofera Blue Ready Foam, 2.5 x2.5 in 1 x Per Day ary Discharge Instructions: Apply to wound bed as instructed Secondary Dressing: Woven Gauze Sponge, Non-Sterile 4x4 in 1 x Per Day Discharge Instructions: Apply over primary dressing as directed. Secondary Dressing: ABD Pad, 5x9 1 x Per Day Discharge Instructions: Apply over primary dressing as directed. Secured With: [employer], 4.5x3.1 (in/yd) 1 x Per Day Discharge Instructions: Secure with Kerlix as directed. Secured With: 17M Medipore H Soft Cloth Surgical Tape, 2x2 (in/yd) 1 x Per Day Discharge Instructions: Secure dressing with tape as directed. GLYCEMIA INTERVENTIONS PROTOCOL PRE-HBO GLYCEMIA INTERVENTIONS ACTION INTERVENTION Obtain pre-HBO capillary blood glucose (ensure 1 physician order is in  chart). A. Notify HBO physician and await physician orders. orders. 2 If result is 70 mg/dl or below: B. If the result meets the hospital definition of a critical result, follow hospital policy. A. Give patient an 8 ounce Glucerna Shake, an 8 ounce Ensure, or 8 ounces of a Glucerna/Ensure equivalent dietary supplement*. B. Wait 30 minutes. If result is 71 mg/dl to 147 mg/dl: C. Retest patients capillary blood glucose (CBG). D. If  result greater than or equal to 110 mg/dl, proceed with HBO. If result less than 110 mg/dl, notify HBO physician and consider holding HBO. If result is 131 mg/dl to 696 mg/dl: A. Proceed with HBO. A. Notify HBO physician and await physician orders. B. It is recommended to hold HBO and do If result is 250 mg/dl or greater: blood/urine ketone testing. C. If the result meets the hospital definition of a critical result, follow hospital policy. POST-HBO GLYCEMIA INTERVENTIONS ACTION INTERVENTION Obtain post HBO capillary blood glucose (ensure 1 physician order is in chart). A. Notify HBO physician and await physician orders. 2 If result is 70 mg/dl or below: B. If the result meets the hospital definition of a critical result, follow hospital policy. A. Give patient an 8 ounce Glucerna Shake, an 8 ounce Ensure, or 8 ounces of a Glucerna/Ensure equivalent dietary supplement*. B. Wait 15 minutes for symptoms of If result is 71 mg/dl to 295 mg/dl: hypoglycemia (i.e. nervousness, anxiety, sweating, chills, clamminess, irritability, confusion, tachycardia or dizziness). C. If patient asymptomatic, discharge patient. If patient symptomatic, repeat capillary blood glucose (CBG) and notify HBO physician. If result is 101 mg/dl to 284 mg/dl: A. Discharge patient. A. Notify HBO physician and await physician orders. B. It is recommended to do blood/urine ketone If result is 250 mg/dl or greater: testing. C. If the result meets the hospital  definition of a critical result, follow hospital policy. *Juice or candies are NOT equivalent products. If patient refuses the Glucerna or Ensure, please consult the hospital dietitian for an appropriate substitute. Electronic Signature(s) Signed: 07/14/2021 4:21:38 PM By: Baltazar Najjar MD Signed: 07/14/2021 4:47:08 PM By: [name] Entered By: [name] on 07/14/2021 11:52:27 -------------------------------------------------------------------------------- Problem List Details Patient Name: Date of Service: [name], [name] [name] 07/14/2021 10:15 A M Medical Record Number: [id number] Patient Account Number: [id number] Date of Birth/Sex: Treating RN: 1961-08-03 (60 y.o. [name] Primary Care Provider: [name] Other Clinician: Referring Provider: Treating Provider/Extender: Avie Echevaria in Treatment: 5 Active Problems ICD-10 Encounter Code Description Active Date MDM Diagnosis E11.621 Type 2 diabetes mellitus with foot ulcer 06/04/2021 No Yes M14.672 Charcot's joint, left ankle and foot 06/04/2021 No Yes L97.528 Non-pressure chronic ulcer of other part of left foot with other specified 06/04/2021 No Yes severity M86.372 Chronic multifocal osteomyelitis, left ankle and foot 06/04/2021 No Yes Inactive Problems Resolved Problems Electronic Signature(s) Signed: 07/14/2021 4:21:38 PM By: Baltazar Najjar MD Entered By: Baltazar Najjar on 07/14/2021 12:11:35 -------------------------------------------------------------------------------- Progress Note Details Patient Name: Date of Service: [name] 07/14/2021 10:15 A M Medical Record Number: [id number] Patient Account Number: [id number] Date of Birth/Sex: Treating RN: 09-May-1961 (60 y.o. [name] Primary Care Provider: [name] Other Clinician: Referring Provider: Treating Provider/Extender: Avie Echevaria in Treatment: 5 Subjective History of Present  Illness (HPI) 10/31/2019 upon evaluation today patient appears to be doing somewhat poorly in regard to his bilateral plantar feet. He has wounds that he tells me have been present since 2012 intermittently off and on. Most recently this has been open for at least the past 6 months to a year. He has been trying to treat this in different ways using Santyl along with various other dressings including Medihoney and even at one point Xeroform. Nothing really has seem to get this completely closed. He was recently in the hospital for cellulitis of his leg subsequently he did have x-rays as well as MRIs that showed negative for any signs of osteomyelitis in regard  to the wounds on his feet. Fortunately there is no signs of systemic infection at this time. No fevers, chills, nausea, vomiting, or diarrhea. Patient has previously used Darco offloading shoes as far as frontal floaters as well as postop surgical shoes. He has never been in a total contact cast that may be something we need to strongly consider here. Patient's most recent hemoglobin A1c 1 month ago was 5.3 seems to be very well controlled which is great. Subsequently he has seen vascular as well as podiatry. His ABIs are 1.07 on the left and 1.14 on the right he seems to be doing well he does have chronic venous stasis. 11/07/2019 upon evaluation today patient appears to be doing well with regard to his wounds all things considered. I do not see any severe worsening he still has some callus buildup on the right more than the left he notes that he has been probably more active than he should as far as walking is concerned is just very hard to not be active. He knows he needs to be more careful in this regard however. He is willing to give the cast a try at this point although he notes that he is a little nervous about this just with regard to balance although he will be very careful and obviously if he has any trouble he knows to contact the office  and let me know. 1/22; patient is in for his obligatory first total contact cast change. Our intake nurse reported a very large amount of drainage which is spelled out over to the surrounding skin. Has bilateral diabetic foot wounds. He has Charcot feet. We have been using silver alginate on his wounds. 11/14/2019 on evaluation today patient is actually seeming to make good improvement in regard to his bilateral plantar foot wounds. We have been using a cast on the left side and on the right side he has been using dressings he is changing up his own accord. With that being said he tells me that he is also not walking as much just due to how unsteady he feels. He takes it easy when he does have to walk and when he does not have to walk he is resting. This is probably help in his right foot as well has the left foot which is actually measuring better. In fact both are measuring better. Overall I am very pleased with how things seem to be progressing. The patient does have some odor on the left foot this does have me concerned about the possibility of infection, and actually probably go ahead and put him on antibiotics today as well as utilizing a continuation of the cast on the left foot I think that will be fine we probably just need to bring him in sooner to change this not last a whole week. 1/29; we brought the patient back today for a total contact cast change on the left out of concern for excessive drainage. We are using drawtex over the wound as the primary dressing 11/21/2019 on evaluation today patient appears to be doing well with regard to his left plantar foot. In fact both foot ulcers actually seem to be doing pretty well. Nonetheless he is having a lot of drainage on the left at this time and again we did obtain a wound culture did show positive for Staph aureus that was reviewed by myself today as well. Nonetheless he is on Bactrim which was shown to be sensitive that should be helping in  this  regard. Fortunately there is no signs of infection systemically at this point. 2/5; back in clinic today for a total contact cast change apparently secondary to very significant drainage. Still using drawtex 11/28/2019 upon evaluation today patient appears to be doing well with regard to his wounds. The right foot is doing okay as measured about the same in my opinion. The left foot is actually showing signs of significant improvement is measuring smaller there is a lot of hyper granulation likely due to the continued drainage at this point. We did obtain approval for a snap VAC I think that is good to be appropriate for him and will likely help this tremendously underneath the cast. He is definitely in agreement with proceeding with such. 2/12; patient came in today after his snap VAC lost suction. Brought in to see one of our nurses. The dressing was replaced and then we put the cast back on and rehooked up the snap VAC. Apparently his wound looked very good per our intake nurse. 2/15; again we replaced the cast on Friday. By Saturday the snap VAC and light suction. He called this morning he comes in acutely. The wounds look fine however the VAC is not functioning. We replaced the cast using silver alginate as the primary dressing backed with Kerramax. The snap VAC was not replaced 12/05/2019 upon evaluation today patient appears to be doing better in regard to his left plantar foot ulcer. Fortunately there is no signs of active infection at this time. Unfortunately he is continuing to have issues with the right foot he is really not making any progress here things seem to be somewhat stagnant to be honest. The depth has increased but that is due to me having debrided the wound in the past based on what I am seeing. 12/12/2019 on evaluation today patient appears to be doing more poorly in regard to the left lower extremity. He has some erythema spreading up the side of his foot I am concerned  about infection again at this point. Unfortunately he has been seeing improvement with a total contact cast but I do not think we should put that on today. On his right plantar foot he continues to have significant drainage this is actually measuring deeper I really do not feel like you are making any progress whatsoever. I have prescribed Granix for him unfortunately his insurance apparently was going to cost him a $500 co-pay. 12/19/2019 upon evaluation today patient actually appears to be doing better in regard to both wounds. With that being said he actually did get the reGranix which he had to pay $500 for. With that being said it does look like that he is actually made some improvement based on what I am seeing at this point with the reGranix. Obviously if he is going to continue this we are going to do something about trying to get him some help in covering the cost. 12/26/2019 on evaluation today patient appears to be doing really much better even compared to last week. Overall the wound seems to be much better even compared to last week and last week was better than the week before. Since has been using the reGranix his symptoms have improved significantly. With that being said the issue right now is simply that this is a very expensive medication for him the first dose cost him $500. Upon inspection patient's wound bed actually is however dramatically improved compared to before he started this 2 weeks ago. 01/02/2020 upon evaluation today patient appears to  actually be doing well. He still had a little bit of reGranix left that has been using in small amounts he just been applying it every other day instead of every day in order to make it go longer. Overall we are still seeing excellent improvement he is measuring smaller looking better healthier tissue and everything seems to be pointing to this headed in the right direction. Fortunately there is no evidence of infection either which is also  excellent news. He does have his MRI coming up within the next week. 01/09/2020 upon evaluation today patient appears to be doing a little worse today compared to previous week's evaluation. He is actually been out of the reGranix at this point. He has been trying to make the stretch out so he has been changing the dressings on a regular set schedule like he was previous. I think this has made a difference. Fortunately there is no signs of active infection at this time. No fevers, chills, nausea, vomiting, or diarrhea. 01/16/2020 upon evaluation today patient appears to be doing well with regard to his left plantar foot ulcer. The right plantar foot still shows some significant depth at this point. Fortunately there is no signs of active infection at this time. 01/30/2020 upon evaluation today patient appears to be doing about the same in regard to his right plantar foot ulcer there is still some depth here and we had to wait till he actually switched over to his new insurance to get approval for the MRI under his new insurance plan. With that being said he now has switched as of April 1. Fortunately there is no signs of active infection at this time. Overall in regard to his left foot ulcer this seems to be doing much better and I am actually very pleased with how things are going. With that being said it is not quite as much progress as we were seeing with the reGranix but at the same time he has had trouble getting this apparently there is been some hindrance here. I Ernie Hew try to actually send this to [facility] that was recommended by the drug rep to me. 02/13/20 upon evaluation today patient appears to be doing better in regard to his left foot ulcer this is great news. Unfortunately the right foot ulcer is not really significantly better at this time. There is no signs of active infection systemically though he did have his MRI which showed unfortunately he does have infection noted including an  abscess in the foot. There is also marrow changes noted which are consistent with osteomyelitis based on the radiology review and interpretation. Unfortunately considering that the wound is not really making the progress that we will he would like to be seen I think that this is an indication that he may need some further referral both infectious disease as well as potentially to podiatrist to see if there is anything that can be done to help with the situation that were dealing with here. The left foot again is doing great. READMISSION 06/04/2021 This is a 60 year old man who was in the clinic in 2021 followed by Allen Derry for areas on the right and left foot. He developed a left foot infection and was referred to ID. He left the clinic in a nonhealed state and was followed for a period of [facility] Marylene Land. Apparently things really deteriorated in early July when he was admitted to hospital from 04/27/2021 through 05/06/2021 with sepsis secondary to a left  foot infection. His blood cultures were negative. An MRI suggested fifth metatarsal osteomyelitis a left ankle septic joint. He was treated with vancomycin and ertapenem which he is still taking and may just about be finishing. He was seen by orthopedics and the patient adamantly refused to BKA. As far as I can tell he did not have the ankle aspirated I am not exactly sure what the issue was here. Since he has been discharged she is at [facility] health care for rehabilitation. He was last seen by Dr. Algis Liming on 05/20/2021 he noted osteo of the tibial talar bone cuboid and fifth metatarsal which is even more extensive than what was suggested by the MRI. He is apparently going for a consultation with orthopedic surgery in [place] sometime next week. I received a call about this man 2 weeks ago from Dr. Allyson Sabal who follows him for the possibility of PAD. ABIs I think done in the office showed a ABI on the right of 1.05 at the PTA and  0.99 at the PTA on the left. He had a DVT rule out in the left leg that was negative for the DVT. Past medical history is extensive and includes diastolic heart failure, right first metatarsal head ulcer in 2021, excision of the right second ray by Dr. Marylene Land on 03/13/2020, hypertension, hypothyroidism. Left total hip replacement, right total knee replacement, carpal tunnel syndrome, obstructive sleep apnea alcohol abuse with cirrhosis although the patient denies current alcohol intake. The patient does not think he is a diabetic however looking through  link I see 2 HgbAic's of this year that were greater than equal to 6.5 which by definition makes him diabetic. Nevertheless he is not on any treatment and does not check his blood sugars. The patient is now back home out of the nursing home. Saw Dr. Algis Liming last week he was taken off vancomycin and ertapenem on August 22 and now is on doxycycline on Augmentin. He also saw Dr. Weston Anna who is his orthopedic surgeon in [place] he recommended a [employer]	[place]. He has been using Medihoney. 9/6I have been having trouble getting hyperbarics approved through our prior authorization process. Even though he had a limb threatening infection in the left foot and probably the left ankle there glitches in how some of the reports are worded also some of the consultants. In any case I am going to repeat his sedimentation rate and C-reactive protein. I am generally not in favor of doing things like this as they really do not alter the plan of care from my point of view however I am going to need to demonstrate that these remain high in order to get this through forhyperbaric treatment for chronic refractory osteomyelitis 9/13; following this man for a wound on his left plantar foot in the setting of type 2 diabetes and Charcot deformity. He has underlying chronic refractory osteomyelitis. Follow-up sedimentation rate and C-reactive protein were both elevated but  the C-reactive protein was down to 1.4, sedimentation rate at 70. Sedimentation rate was only slightly down from previous at 85. His wound is measuring slightly smaller. 9/20; patient started hyperbaric oxygen therapy today and tolerated treatment well. This is for the underlying osteomyelitis. He remains on antibiotics but thinks he is getting close to finishing. The wound on the plantar aspect of his foot is the other issue we are following here. He is using Medihoney The patient has a Charcot foot in the setting of type 2 diabetes. He is going to need a total  contact cast although his partner was away this week and we elected to delay this till next week 9/27; patient still tolerating hyperbaric oxygen well. Wound looked generally healthy not much depth under illumination still 100% covered in fibrinous debris. Raised callused edges around the wound he was prepared for a total contact cast. We have been using Medihoney Objective Constitutional Sitting or standing Blood Pressure is within target range for patient.. Pulse regular and within target range for patient.Marland Kitchen Respirations regular, non-labored and within target range.. Temperature is normal and within the target range for the patient.Marland Kitchen Appears in no distress. Vitals Time Taken: 10:38 AM, Height: 73 in, Weight: 270 lbs, BMI: 35.6, Temperature: 97.5 F, Pulse: 90 bpm, Respiratory Rate: 16 breaths/min, Blood Pressure: 136/85 mmHg, Capillary Blood Glucose: 134 mg/dl. General Notes: Wound exam; mid part of the left plantar foot. Under illumination and nonviable surface and thick skin and callus around the wound and aggressive debridement with a #5 curette to remove the skin and callus give me a viable edge. Also very fibrinous surface over the wound also aggressively debrided hemostasis with direct pressure. Afterwards total contact cast applied in the standard fashion Integumentary (Hair, Skin) Wound #3 status is Open. Original cause of wound  was Gradually Appeared. The date acquired was: 03/18/2021. The wound has been in treatment 5 weeks. The wound is located on the Left,Plantar Foot. The wound measures 1.8cm length x 2.4cm width x 0.2cm depth; 3.393cm^2 area and 0.679cm^3 volume. There is Fat Layer (Subcutaneous Tissue) exposed. There is no tunneling or undermining noted. There is a medium amount of serosanguineous drainage noted. The wound margin is distinct with the outline attached to the wound base. There is large (67-100%) red granulation within the wound bed. There is no necrotic tissue within the wound bed. General Notes: calloused periwound Assessment Active Problems ICD-10 Type 2 diabetes mellitus with foot ulcer Charcot's joint, left ankle and foot Non-pressure chronic ulcer of other part of left foot with other specified severity Chronic multifocal osteomyelitis, left ankle and foot Procedures Wound #3 Pre-procedure diagnosis of Wound #3 is a Diabetic Wound/Ulcer of the Lower Extremity located on the Left,Plantar Foot .Severity of Tissue Pre Debridement is: Fat layer exposed. There was a Excisional Skin/Subcutaneous Tissue Debridement with a total area of 4.32 sq cm performed by Maxwell Caul., MD. With the following instrument(s): Curette to remove Non-Viable tissue/material. Material removed includes Callus, Subcutaneous Tissue, and Skin: Dermis. No specimens were taken. A time out was conducted at 11:17, prior to the start of the procedure. A Moderate amount of bleeding was controlled with Silver Nitrate. The procedure was tolerated well. Post Debridement Measurements: 1.8cm length x 2.4cm width x 0.2cm depth; 0.679cm^3 volume. Character of Wound/Ulcer Post Debridement is stable. Severity of Tissue Post Debridement is: Fat layer exposed. Post procedure Diagnosis Wound #3: Same as Pre-Procedure Pre-procedure diagnosis of Wound #3 is a Diabetic Wound/Ulcer of the Lower Extremity located on the Left,Plantar Foot .  There was a T Contact Cast otal Procedure by Maxwell Caul., MD. Post procedure Diagnosis Wound #3: Same as Pre-Procedure Plan Follow-up Appointments: Return Appointment in 1 week. - Tuesday (07/21/21) with Dr. Leanord Hawking Friday (07/17/21) for cast change Bathing/ Shower/ Hygiene: May shower and wash wound with soap and water. - prior to dressing change Edema Control - Lymphedema / SCD / Other: Elevate legs to the level of the heart or above for 30 minutes daily and/or when sitting, a frequency of: - throughout the day Avoid standing for long  periods of time. Exercise regularly Moisturize legs daily. Off-Loading: T Contact Cast to Left Lower Extremity - Size 4 otal Additional Orders / Instructions: Follow Nutritious Diet Hyperbaric Oxygen Therapy: Wound #3 Left,Plantar Foot: Evaluate for HBO Therapy - Starts 07/07/21 Indication: - Chronic Refractory Osteomyelitis 2.5 ATA for 90 Minutes with 2 Five (5) Minute Air Breaks T Number of Treatments: - 40 otal One treatments per day (delivered Monday through Friday unless otherwise specified in Special Instructions below): Finger stick Blood Glucose Pre- and Post- HBOT Treatment. Follow Hyperbaric Oxygen Glycemia Protocol Afrin (Oxymetazoline HCL) 0.05% nasal spray - 1 spray in both nostrils daily as needed prior to HBO treatment for difficulty clearing ears WOUND #3: - Foot Wound Laterality: Plantar, Left Cleanser: Soap and Water 1 x Per Day/ Discharge Instructions: May shower and wash wound with dial antibacterial soap and water prior to dressing change. Cleanser: Wound Cleanser 1 x Per Day/ Discharge Instructions: Cleanse the wound with wound cleanser prior to applying a clean dressing using gauze sponges, not tissue or cotton balls. Prim Dressing: Hydrofera Blue Ready Foam, 2.5 x2.5 in 1 x Per Day/ ary Discharge Instructions: Apply to wound bed as instructed Secondary Dressing: Woven Gauze Sponge, Non-Sterile 4x4 in 1 x Per  Day/ Discharge Instructions: Apply over primary dressing as directed. Secondary Dressing: ABD Pad, 5x9 1 x Per Day/ Discharge Instructions: Apply over primary dressing as directed. Secured With: [employer], 4.5x3.1 (in/yd) 1 x Per Day/ Discharge Instructions: Secure with Kerlix as directed. Secured With: 66M Medipore H Soft Cloth Surgical T ape, 2x2 (in/yd) 1 x Per Day/ Discharge Instructions: Secure dressing with tape as directed. 1. I change the primary dressing to Hydrofera Blue, gauze and ABD 2. T contact cast he will be back later in the week to have the obligatory first total contact cast change otal 3. Continue with HBO 4. Continues with Augmentin and doxycycline Electronic Signature(s) Signed: 07/14/2021 4:21:38 PM By: Baltazar Najjar MD Entered By: Baltazar Najjar on 07/14/2021 12:16:58 -------------------------------------------------------------------------------- Total Contact Cast Details Patient Name: Date of Service: [name], [name] [name] 07/14/2021 10:15 A M Medical Record Number: [id number] Patient Account Number: [id number] Date of Birth/Sex: Treating RN: 07/07/1961 (60 y.o. [name] Primary Care Provider: [name] Other Clinician: Referring Provider: Treating Provider/Extender: Avie Echevaria in Treatment: 5 T Contact Cast Applied for Wound Assessment: otal Wound #3 Left,Plantar Foot Performed By: Physician Maxwell Caul., MD Post Procedure Diagnosis Same as Pre-procedure Electronic Signature(s) Signed: 07/14/2021 4:21:38 PM By: Baltazar Najjar MD Signed: 07/14/2021 4:47:08 PM By: [name] Entered By: [name] on 07/14/2021 11:24:15 -------------------------------------------------------------------------------- SuperBill Details Patient Name: Date of Service: [name], [name] [name] 07/14/2021 Medical Record Number: [id number] Patient Account Number: [id number] Date of Birth/Sex: Treating RN: 1961/07/04 (60 y.o. [name] Primary Care Provider: [name] Other Clinician: Referring Provider: Treating Provider/Extender: Avie Echevaria in Treatment: 5 Diagnosis Coding ICD-10 Codes Code Description [phone] Type 2 diabetes mellitus with foot ulcer M14.672 Charcot's joint, left ankle and foot L97.528 Non-pressure chronic ulcer of other part of left foot with other specified severity M86.372 Chronic multifocal osteomyelitis, left ankle and foot Facility Procedures CPT4 Code: [id number] Description: 11042 - DEB SUBQ TISSUE 20 SQ CM/< ICD-10 Diagnosis Description L97.528 Non-pressure chronic ulcer of other part of left foot with other specified seve Modifier: rity Quantity: 1 Physician Procedures : CPT4 Code Description Modifier [id number] 11042 - WC PHYS SUBQ TISS 20 SQ CM ICD-10 Diagnosis Description L97.528 Non-pressure chronic ulcer of  other part of left foot with other specified severity Quantity: 1 Electronic Signature(s) Signed: 07/14/2021 4:21:38 PM By: Baltazar Najjar MD Previous Signature: 07/14/2021 11:49:17 AM Version By: [name] Entered By: Baltazar Najjar on 07/14/2021 12:17:13

## 2021-07-15 NOTE — Progress Notes (Signed)
[name], [name] ([id number]) Visit Report for 07/14/2021 HBO Details Patient Name: Date of Service: [name], [name] 07/14/2021 8:00 A M Medical Record Number: [id number] Patient Account Number: [id number] Date of Birth/Sex: Treating RN: 1961-04-23 (60 y.o. [name], [name] Primary Care [name]: [name] Other Clinician: Haywood [name] Referring [name]: Treating [name]/Extender: [name] in Treatment: 5 HBO Treatment Course Details Treatment Course Number: 1 Ordering [name]: [name] Treatments Ordered: otal 40 HBO Treatment Start Date: 07/07/2021 HBO Indication: Chronic Refractory Osteomyelitis to Left,Plantar Foot HBO Treatment Details Treatment Number: 5 Patient Type: Outpatient Chamber Type: Monoplace Chamber Serial #: [id number] Treatment Protocol: 2.5 ATA with 90 minutes oxygen, with two 5 minute air breaks Treatment Details Compression Rate Down: 1.5 psi / minute De-Compression Rate Up: 2.0 psi / minute A breaks and breathing ir Compress Tx Pressure periods Decompress Decompress Begins Reached (leave unused spaces Begins Ends blank) Chamber Pressure (ATA 1 2.5 2.5 2.5 2.5 2.5 - - 2.5 1 ) Clock Time (24 hr) 08:30 08:46 09:16 09:21 09:51 09:56 - - 10:26 10:37 Treatment Length: 127 (minutes) Treatment Segments: 4 Vital Signs Capillary Blood Glucose Reference Range: 80 - 120 mg / dl HBO Diabetic Blood Glucose Intervention Range: <131 mg/dl or >443 mg/dl Time Vitals Blood Respiratory Capillary Blood Glucose Pulse Action Type: Pulse: Temperature: Taken: Pressure: Rate: Glucose (mg/dl): Meter #: Oximetry (%) Taken: Pre 08:18 137/74 105 16 97.8 132 Post 10:38 136/85 90 16 97.5 134 Treatment Response Treatment Toleration: Well Treatment Completion Status: Treatment Completed without Adverse Event [name] Notes No concerns with treatment given. Patient was also seen for wound care review Physician HBO Attestation: I  certify that I supervised this HBO treatment in accordance with Medicare guidelines. A trained emergency response team is readily available per Yes hospital policies and procedures. Continue HBOT as ordered. Yes Electronic Signature(s) Signed: 07/14/2021 4:21:38 PM By: [name] Najjar MD Previous Signature: 07/14/2021 12:11:19 PM Version By: [name] EMT Entered By: [name] on 07/14/2021 16:19:58 -------------------------------------------------------------------------------- HBO Safety Checklist Details Patient Name: Date of Service: [name] 07/14/2021 8:00 A M Medical Record Number: [id number] Patient Account Number: [id number] Date of Birth/Sex: Treating RN: Aug 13, 1961 (60 y.o. [name], [name] Primary Care Menashe Kafer: [name] Other Clinician: Haywood [name] Referring [name]: Treating [name]/Extender: [name] in Treatment: 5 HBO Safety Checklist Items Safety Checklist Consent Form Signed Patient voided / foley secured and emptied When did you last eato 0700 Last dose of injectable or oral agent n/a Ostomy pouch emptied and vented if applicable NA All implantable devices assessed, documented and approved NA Intravenous access site secured and place NA Valuables secured Linens and cotton and cotton/polyester blend (less than 51% polyester) Personal oil-based products / skin lotions / body lotions removed Wigs or hairpieces removed Smoking or tobacco materials removed NA Books / newspapers / magazines / loose paper removed NA Cologne, aftershave, perfume and deodorant removed Jewelry removed (may wrap wedding band) Make-up removed NA Hair care products removed Hair Elastic Battery operated devices (external) removed Heating patches and chemical warmers removed Titanium eyewear removed NA Nail polish cured greater than 10 hours NA Casting material cured greater than 10 hours NA Hearing aids  removed NA Loose dentures or partials removed NA Prosthetics have been removed NA Patient demonstrates correct use of air break device (if applicable) Patient concerns have been addressed Patient grounding bracelet on and cord attached to chamber Specifics for Inpatients (complete in addition to above) Medication sheet sent with patient NA  Intravenous medications needed or due during therapy sent with patient NA Drainage tubes (e.g. nasogastric tube or chest tube secured and vented) NA Endotracheal or Tracheotomy tube secured NA Cuff deflated of air and inflated with saline NA Airway suctioned NA Notes Paper version of the safety checklist was used prior to treatment start. Data entered afterward. Electronic Signature(s) Signed: 07/15/2021 11:07:05 AM By: [name] EMT Entered By: [name] on 07/14/2021 12:12:50

## 2021-07-15 NOTE — Progress Notes (Signed)
[name], [name] ([id number]) Visit Report for 07/14/2021 Arrival Information Details Patient Name: Date of Service: [name], [name] 07/14/2021 10:15 A M Medical Record Number: [id number] Patient Account Number: [id number] Date of Birth/Sex: Treating RN: September 08, 1961 (60 y.o. [name] Primary Care Provider: Billey Gosling Other Clinician: Referring Provider: Treating Provider/Extender: Genella Rife in Treatment: 5 Visit Information History Since Last Visit Added or deleted any medications: No Patient Arrived: Ambulatory Any new allergies or adverse reactions: No Arrival Time: 11:06 Had a fall or experienced change in No Transfer Assistance: None activities of daily living that may affect Patient Identification Verified: Yes risk of falls: Secondary Verification Process Completed: Yes Signs or symptoms of abuse/neglect since last visito No Patient Requires Transmission-Based Precautions: No Hospitalized since last visit: No Patient Has Alerts: Yes Implantable device outside of the clinic excluding No Patient Alerts: L ABI: 0.99 cellular tissue based products placed in the center R ABI: 1.05 since last visit: Has Dressing in Place as Prescribed: Yes Pain Present Now: No Electronic Signature(s) Signed: 07/14/2021 4:47:08 PM By: Lorrin Jackson Entered By: Lorrin Jackson on 07/14/2021 11:07:04 -------------------------------------------------------------------------------- Encounter Discharge Information Details Patient Name: Date of Service: [name] 07/14/2021 10:15 A M Medical Record Number: [id number] Patient Account Number: [id number] Date of Birth/Sex: Treating RN: 1961/04/23 (60 y.o. [name] Primary Care Provider: Billey Gosling Other Clinician: Referring Provider: Treating Provider/Extender: Genella Rife in Treatment: 5 Encounter Discharge Information Items Post Procedure Vitals Discharge Condition:  Stable Temperature (F): 97.5 Ambulatory Status: Walker Pulse (bpm): 90 Discharge Destination: Home Respiratory Rate (breaths/min): 16 Transportation: Private Auto Blood Pressure (mmHg): 136/85 Schedule Follow-up Appointment: Yes Clinical Summary of Care: Provided on 07/14/2021 Form Type Recipient Paper Patient Patient Electronic Signature(s) Signed: 07/14/2021 11:53:34 AM By: Lorrin Jackson Entered By: Lorrin Jackson on 07/14/2021 11:53:34 -------------------------------------------------------------------------------- Lower Extremity Assessment Details Patient Name: Date of Service: [name], [name] 07/14/2021 10:15 A M Medical Record Number: [id number] Patient Account Number: [id number] Date of Birth/Sex: Treating RN: [birth year] (60 y.o. [name] Primary Care Provider: Billey Gosling Other Clinician: Referring Provider: Treating Provider/Extender: Genella Rife in Treatment: 5 Edema Assessment Assessed: Shirlyn Goltz: Yes] [Right: No] Edema: [Left: Ye] [Right: s] Calf Left: Right: Point of Measurement: 36 cm From Medial Instep 42.5 cm Ankle Left: Right: Point of Measurement: 13 cm From Medial Instep 26 cm Vascular Assessment Pulses: Dorsalis Pedis Palpable: [Left:Yes] Electronic Signature(s) Signed: 07/14/2021 4:47:08 PM By: Lorrin Jackson Entered By: Lorrin Jackson on 07/14/2021 11:09:53 -------------------------------------------------------------------------------- Multi Wound Chart Details Patient Name: Date of Service: [name] 07/14/2021 10:15 A M Medical Record Number: [id number] Patient Account Number: [id number] Date of Birth/Sex: Treating RN: 1960/11/30 (60 y.o. [name] Primary Care Provider: Billey Gosling Other Clinician: Referring Provider: Treating Provider/Extender: Genella Rife in Treatment: 5 Vital Signs Height(in): 73 Capillary Blood Glucose(mg/dl): 134 Weight(lbs): 270 Pulse(bpm):  60 Body Mass Index(BMI): 36 Blood Pressure(mmHg): 136/85 Temperature(F): 97.5 Respiratory Rate(breaths/min): 16 Photos: [3:Left, Plantar Foot] [N/A:N/A N/A] Wound Location: [3:Gradually Appeared] [N/A:N/A] Wounding Event: [3:Diabetic Wound/Ulcer of the Lower] [N/A:N/A] Primary Etiology: [3:Extremity Anemia, Sleep Apnea, Congestive] [N/A:N/A] Comorbid History: [3:Heart Failure, Hypertension, Peripheral Venous Disease, Cirrhosis , Type II Diabetes, Osteoarthritis, Neuropathy 03/18/2021] [N/A:N/A] Date Acquired: [3:5] [N/A:N/A] Weeks of Treatment: [3:Open] [N/A:N/A] Wound Status: [3:1.8x2.4x0.2] [N/A:N/A] Measurements L x W x D (cm) [3:3.393] [N/A:N/A] A (cm) : rea [3:0.679] [N/A:N/A] Volume (cm) : [3:52.00%] [N/A:N/A] % Reduction in A [3:rea: 4.00%] [N/A:N/A] % Reduction in Volume: [3:Grade 3] [  N/A:N/A] Classification: [3:Medium] [N/A:N/A] Exudate A mount: [3:Serosanguineous] [N/A:N/A] Exudate Type: [3:red, brown] [N/A:N/A] Exudate Color: [3:Distinct, outline attached] [N/A:N/A] Wound Margin: [3:Large (67-100%)] [N/A:N/A] Granulation A mount: [3:Red] [N/A:N/A] Granulation Quality: [3:None Present (0%)] [N/A:N/A] Necrotic A mount: [3:Fat Layer (Subcutaneous Tissue): Yes N/A] Exposed Structures: [3:Fascia: No Tendon: No Muscle: No Joint: No Bone: No Small (1-33%)] [N/A:N/A] Epithelialization: [3:Debridement - Excisional] [N/A:N/A] Debridement: Pre-procedure Verification/Time Out 11:17 [N/A:N/A] Taken: [3:Callus, Subcutaneous] [N/A:N/A] Tissue Debrided: [3:Skin/Subcutaneous Tissue] [N/A:N/A] Level: [3:4.32] [N/A:N/A] Debridement A (sq cm): [3:rea Curette] [N/A:N/A] Instrument: [3:Moderate] [N/A:N/A] Bleeding: [3:Silver Nitrate] [N/A:N/A] Hemostasis A chieved: [3:Procedure was tolerated well] [N/A:N/A] Debridement Treatment Response: [3:1.8x2.4x0.2] [N/A:N/A] Post Debridement Measurements L x W x D (cm) [3:0.679] [N/A:N/A] Post Debridement Volume: (cm) [3:calloused periwound]  [N/A:N/A] Assessment Notes: [3:Debridement] [N/A:N/A] Procedures Performed: [3:T Contact Cast otal] Treatment Notes Wound #3 (Foot) Wound Laterality: Plantar, Left Cleanser Soap and Water Discharge Instruction: May shower and wash wound with dial antibacterial soap and water prior to dressing change. Wound Cleanser Discharge Instruction: Cleanse the wound with wound cleanser prior to applying a clean dressing using gauze sponges, not tissue or cotton balls. Peri-Wound Care Topical Primary Dressing Hydrofera Blue Ready Foam, 2.5 x2.5 in Discharge Instruction: Apply to wound bed as instructed Secondary Dressing Woven Gauze Sponge, Non-Sterile 4x4 in Discharge Instruction: Apply over primary dressing as directed. ABD Pad, 5x9 Discharge Instruction: Apply over primary dressing as directed. Secured With [employer], 4.5x3.1 (in/yd) Discharge Instruction: Secure with Kerlix as directed. 28M Medipore H Soft Cloth Surgical T ape, 2x2 (in/yd) Discharge Instruction: Secure dressing with tape as directed. Compression Wrap Compression Stockings Add-Ons Electronic Signature(s) Signed: 07/14/2021 4:21:38 PM By: Linton Ham MD Signed: 07/15/2021 5:21:18 PM By: Rhae Hammock RN Entered By: Linton Ham on 07/14/2021 12:11:52 -------------------------------------------------------------------------------- Multi-Disciplinary Care Plan Details Patient Name: Date of Service: [name], [name] 07/14/2021 10:15 A M Medical Record Number: [id number] Patient Account Number: [id number] Date of Birth/Sex: Treating RN: [birth year] (61 y.o. [name] Primary Care Provider: Billey Gosling Other Clinician: Referring Provider: Treating Provider/Extender: Genella Rife in Treatment: 5 Multidisciplinary Care Plan reviewed with physician Active Inactive HBO Nursing Diagnoses: Anxiety related to feelings of confinement associated with the hyperbaric oxygen  chamber Anxiety related to knowledge deficit of hyperbaric oxygen therapy and treatment procedures Discomfort related to temperature and humidity changes inside hyperbaric chamber Potential for barotraumas to ears, sinuses, teeth, and lungs or cerebral gas embolism related to changes in atmospheric pressure inside hyperbaric oxygen chamber Potential for oxygen toxicity seizures related to delivery of 100% oxygen at an increased atmospheric pressure Potential for pulmonary oxygen toxicity related to delivery of 100% oxygen at an increased atmospheric pressure Goals: Barotrauma will be prevented during HBO2 Date Initiated: 06/04/2021 T arget Resolution Date: 08/14/2021 Goal Status: Active Patient and/or family will be able to state/discuss factors appropriate to the management of their disease process during treatment Date Initiated: 06/04/2021 T arget Resolution Date: 08/14/2021 Goal Status: Active Patient will tolerate the hyperbaric oxygen therapy treatment Date Initiated: 06/04/2021 T arget Resolution Date: 08/14/2021 Goal Status: Active Patient will tolerate the internal climate of the chamber Date Initiated: 06/04/2021 T arget Resolution Date: 08/14/2021 Goal Status: Active Patient/caregiver will verbalize understanding of HBO goals, rationale, procedures and potential hazards Date Initiated: 06/04/2021 T arget Resolution Date: 08/14/2021 Goal Status: Active Signs and symptoms of pulmonary oxygen toxicity will be recognized and promptly addressed Date Initiated: 06/04/2021 T arget Resolution Date: 08/14/2021 Goal Status: Active Signs and symptoms of seizure will be recognized  and promptly addressed ; seizing patients will suffer no harm Date Initiated: 06/04/2021 T arget Resolution Date: 08/14/2021 Goal Status: Active Interventions: Administer a five (5) minute air break for patient if signs and symptoms of seizure appear and notify the hyperbaric physician Administer  decongestants, per physician orders, prior to HBO2 Administer the correct therapeutic gas delivery based on the patients needs and limitations, per physician order Assess and provide for patients comfort related to the hyperbaric environment and equalization of middle ear Assess for signs and symptoms related to adverse events, including but not limited to confinement anxiety, pneumothorax, oxygen toxicity and baurotrauma Assess patient for any history of confinement anxiety Assess patient's knowledge and expectations regarding hyperbaric medicine and provide education related to the hyperbaric environment, goals of treatment and prevention of adverse events Implement protocols to decrease risk of pneumothorax in high risk patients Notes: Nutrition Nursing Diagnoses: Impaired glucose control: actual or potential Potential for alteratiion in Nutrition/Potential for imbalanced nutrition Goals: Patient/caregiver agrees to and verbalizes understanding of need to use nutritional supplements and/or vitamins as prescribed Date Initiated: 06/04/2021 Date Inactivated: 07/07/2021 Target Resolution Date: 07/04/2021 Goal Status: Met Patient/caregiver will maintain therapeutic glucose control Date Initiated: 06/04/2021 Target Resolution Date: 08/04/2021 Goal Status: Active Interventions: Assess HgA1c results as ordered upon admission and as needed Assess patient nutrition upon admission and as needed per policy Provide education on elevated blood sugars and impact on wound healing Notes: 07/07/21: Glucose control ongoing Wound/Skin Impairment Nursing Diagnoses: Impaired tissue integrity Knowledge deficit related to ulceration/compromised skin integrity Goals: Patient/caregiver will verbalize understanding of skin care regimen Date Initiated: 06/04/2021 Target Resolution Date: 08/04/2021 Goal Status: Active Interventions: Assess patient/caregiver ability to obtain necessary supplies Assess  patient/caregiver ability to perform ulcer/skin care regimen upon admission and as needed Assess ulceration(s) every visit Provide education on ulcer and skin care Notes: Electronic Signature(s) Signed: 07/14/2021 4:47:08 PM By: Lorrin Jackson Entered By: Lorrin Jackson on 07/14/2021 11:13:11 -------------------------------------------------------------------------------- Pain Assessment Details Patient Name: Date of Service: [name], [name] 07/14/2021 10:15 A M Medical Record Number: [id number] Patient Account Number: [id number] Date of Birth/Sex: Treating RN: 1961/05/30 (60 y.o. [name] Primary Care Provider: Billey Gosling Other Clinician: Referring Provider: Treating Provider/Extender: Genella Rife in Treatment: 5 Active Problems Location of Pain Severity and Description of Pain Patient Has Paino No Site Locations Pain Management and Medication Current Pain Management: Electronic Signature(s) Signed: 07/14/2021 4:47:08 PM By: Lorrin Jackson Entered By: Lorrin Jackson on 07/14/2021 11:07:14 -------------------------------------------------------------------------------- Patient/Caregiver Education Details Patient Name: Date of Service: Emelia Salisbury 9/27/2022andnbsp10:15 A M Medical Record Number: [id number] Patient Account Number: [id number] Date of Birth/Gender: Treating RN: 1961/08/31 (61 y.o. [name] Primary Care Physician: Billey Gosling Other Clinician: Referring Physician: Treating Physician/Extender: Genella Rife in Treatment: 5 Education Assessment Education Provided To: Patient Education Topics Provided Offloading: Methods: Explain/Verbal, Printed Responses: State content correctly Wound/Skin Impairment: Methods: Demonstration, Explain/Verbal, Printed Responses: State content correctly Electronic Signature(s) Signed: 07/14/2021 4:47:08 PM By: Lorrin Jackson Entered By: Lorrin Jackson on  07/14/2021 11:13:33 -------------------------------------------------------------------------------- Wound Assessment Details Patient Name: Date of Service: [name], [name] 07/14/2021 10:15 A M Medical Record Number: [id number] Patient Account Number: [id number] Date of Birth/Sex: Treating RN: November 06, 1960 (60 y.o. [name] Primary Care Provider: Billey Gosling Other Clinician: Referring Provider: Treating Provider/Extender: Genella Rife in Treatment: 5 Wound Status Wound Number: 3 Primary Diabetic Wound/Ulcer of the Lower Extremity Etiology: Wound Location: Left, Plantar Foot Wound Open Wounding Event: Gradually Appeared  Status: Date Acquired: 03/18/2021 Comorbid Anemia, Sleep Apnea, Congestive Heart Failure, Hypertension, Weeks Of Treatment: 5 History: Peripheral Venous Disease, Cirrhosis , Type II Diabetes, Clustered Wound: No Osteoarthritis, Neuropathy Photos Wound Measurements Length: (cm) 1.8 Width: (cm) 2.4 Depth: (cm) 0.2 Area: (cm) 3.393 Volume: (cm) 0.679 % Reduction in Area: 52% % Reduction in Volume: 4% Epithelialization: Small (1-33%) Tunneling: No Undermining: No Wound Description Classification: Grade 3 Wound Margin: Distinct, outline attached Exudate Amount: Medium Exudate Type: Serosanguineous Exudate Color: red, brown Foul Odor After Cleansing: No Slough/Fibrino No Wound Bed Granulation Amount: Large (67-100%) Exposed Structure Granulation Quality: Red Fascia Exposed: No Necrotic Amount: None Present (0%) Fat Layer (Subcutaneous Tissue) Exposed: Yes Tendon Exposed: No Muscle Exposed: No Joint Exposed: No Bone Exposed: No Assessment Notes calloused periwound Treatment Notes Wound #3 (Foot) Wound Laterality: Plantar, Left Cleanser Soap and Water Discharge Instruction: May shower and wash wound with dial antibacterial soap and water prior to dressing change. Wound Cleanser Discharge Instruction: Cleanse the wound  with wound cleanser prior to applying a clean dressing using gauze sponges, not tissue or cotton balls. Peri-Wound Care Topical Primary Dressing Hydrofera Blue Ready Foam, 2.5 x2.5 in Discharge Instruction: Apply to wound bed as instructed Secondary Dressing Woven Gauze Sponge, Non-Sterile 4x4 in Discharge Instruction: Apply over primary dressing as directed. ABD Pad, 5x9 Discharge Instruction: Apply over primary dressing as directed. Secured With [employer], 4.5x3.1 (in/yd) Discharge Instruction: Secure with Kerlix as directed. 73M Medipore H Soft Cloth Surgical T ape, 2x2 (in/yd) Discharge Instruction: Secure dressing with tape as directed. Compression Wrap Compression Stockings Add-Ons Electronic Signature(s) Signed: 07/14/2021 4:47:08 PM By: Lorrin Jackson Entered By: Lorrin Jackson on 07/14/2021 11:12:28 -------------------------------------------------------------------------------- Vitals Details Patient Name: Date of Service: [name] 07/14/2021 10:15 A M Medical Record Number: [id number] Patient Account Number: [id number] Date of Birth/Sex: Treating RN: 1961/07/08 (60 y.o. [name], Lauren Primary Care Provider: Billey Gosling Other Clinician: Donavan Burnet Referring Provider: Treating Provider/Extender: Genella Rife in Treatment: 5 Vital Signs Time Taken: 10:38 Temperature (F): 97.5 Height (in): 73 Pulse (bpm): 90 Weight (lbs): 270 Respiratory Rate (breaths/min): 16 Body Mass Index (BMI): 35.6 Blood Pressure (mmHg): 136/85 Capillary Blood Glucose (mg/dl): 134 Reference Range: 80 - 120 mg / dl Electronic Signature(s) Signed: 07/14/2021 12:11:19 PM By: Donavan Burnet EMT Entered By: Donavan Burnet on 07/14/2021 11:52:01

## 2021-07-15 NOTE — Progress Notes (Addendum)
[name], [name] ([id number]) Visit Report for 07/14/2021 Arrival Information Details Patient Name: Date of Service: [name], [name] 07/14/2021 8:00 A M Medical Record Number: [id number] Patient Account Number: [id number] Date of Birth/Sex: Treating RN: 1961/05/30 (60 y.o. [name], Lauren Primary Care Tyreque Finken: [name] Other Clinician: Haywood Pao Referring Sandie Swayze: Treating Yani Lal/Extender: Avie Echevaria in Treatment: 5 Visit Information History Since Last Visit All ordered tests and consults were completed: Yes Patient Arrived: [name] Added or deleted any medications: No Arrival Time: 08:06 Any new allergies or adverse reactions: No Accompanied By: self Had a fall or experienced change in No Transfer Assistance: None activities of daily living that may affect Patient Identification Verified: Yes risk of falls: Secondary Verification Process Completed: Yes Signs or symptoms of abuse/neglect since last visito No Patient Requires Transmission-Based Precautions: No Hospitalized since last visit: No Patient Has Alerts: Yes Implantable device outside of the clinic excluding No Patient Alerts: L ABI: 0.99 cellular tissue based products placed in the center R ABI: 1.05 since last visit: Pain Present Now: No Electronic Signature(s) Signed: 07/16/2021 12:09:10 PM By: Haywood Pao EMT Previous Signature: 07/15/2021 11:07:05 AM Version By: Haywood Pao EMT Entered By: Haywood Pao on 07/16/2021 11:30:24 -------------------------------------------------------------------------------- Encounter Discharge Information Details Patient Name: Date of Service: [name] 07/14/2021 8:00 A M Medical Record Number: [id number] Patient Account Number: [id number] Date of Birth/Sex: Treating RN: September 18, 1961 (60 y.o. [name], Lauren Primary Care Nachman Sundt: [name] Other Clinician: Haywood Pao Referring Abbrielle Batts: Treating  Teresina Bugaj/Extender: Avie Echevaria in Treatment: 5 Encounter Discharge Information Items Discharge Condition: Stable Ambulatory Status: Walker Discharge Destination: Home Transportation: Private Auto Accompanied By: self Schedule Follow-up Appointment: No Clinical Summary of Care: Electronic Signature(s) Signed: 07/14/2021 12:11:19 PM By: Haywood Pao EMT Entered By: Haywood Pao on 07/14/2021 11:39:00 -------------------------------------------------------------------------------- Vitals Details Patient Name: Date of Service: [name] 07/14/2021 8:00 A M Medical Record Number: [id number] Patient Account Number: [id number] Date of Birth/Sex: Treating RN: 1960/12/23 (60 y.o. [name], Lauren Primary Care Shavar Gorka: [name] Other Clinician: Haywood Pao Referring Roselie Cirigliano: Treating Bryer Gottsch/Extender: Avie Echevaria in Treatment: 5 Vital Signs Time Taken: 08:18 Temperature (F): 97.8 Height (in): 73 Pulse (bpm): 105 Weight (lbs): 270 Respiratory Rate (breaths/min): 16 Body Mass Index (BMI): 35.6 Blood Pressure (mmHg): 137/74 Capillary Blood Glucose (mg/dl): 903 Reference Range: 80 - 120 mg / dl Electronic Signature(s) Signed: 07/14/2021 12:11:19 PM By: Haywood Pao EMT Entered By: Haywood Pao on 07/14/2021 09:30:40

## 2021-07-16 ENCOUNTER — Other Ambulatory Visit: Payer: Self-pay

## 2021-07-16 ENCOUNTER — Encounter (HOSPITAL_BASED_OUTPATIENT_CLINIC_OR_DEPARTMENT_OTHER): Payer: Medicare Other | Admitting: Internal Medicine

## 2021-07-16 DIAGNOSIS — E11621 Type 2 diabetes mellitus with foot ulcer: Secondary | ICD-10-CM | POA: Diagnosis not present

## 2021-07-16 LAB — GLUCOSE, CAPILLARY
Glucose-Capillary: 148 mg/dL — ABNORMAL HIGH (ref 70–99)
Glucose-Capillary: 135 mg/dL — ABNORMAL HIGH (ref 70–99)

## 2021-07-16 NOTE — Progress Notes (Addendum)
KALEAB, FRASIER ([id number]) Visit Report for 07/16/2021 Arrival Information Details Patient Name: Date of Service: PEREGRINE, NOLT 07/16/2021 10:00 A M Medical Record Number: [id number] Patient Account Number: [id number] Date of Birth/Sex: Treating RN: January 16, 1961 (60 y.o. [name] Primary Care Farren Nelles: [name] Other Clinician: Haywood Pao Referring Maron Stanzione: Treating Miyonna Ormiston/Extender: Avie Echevaria in Treatment: 6 Visit Information History Since Last Visit All ordered tests and consults were completed: Yes Patient Arrived: [name] Added or deleted any medications: No Arrival Time: 10:12 Any new allergies or adverse reactions: No Accompanied By: self Had a fall or experienced change in No Transfer Assistance: None activities of daily living that may affect Patient Identification Verified: Yes risk of falls: Secondary Verification Process Completed: Yes Signs or symptoms of abuse/neglect since last No Patient Requires Transmission-Based Precautions: No visito Patient Has Alerts: Yes Hospitalized since last visit: No Implantable device outside of the clinic No excluding cellular tissue based products placed in the center since last visit: Has Dressing in Place as Prescribed: Yes Has Footwear/Offloading in Place as Yes Prescribed: Left: Removable Cast Walker/Walking Boot Pain Present Now: No Electronic Signature(s) Signed: 07/16/2021 12:09:10 PM By: Haywood Pao EMT Entered By: Haywood Pao on 07/16/2021 11:32:27 -------------------------------------------------------------------------------- Encounter Discharge Information Details Patient Name: Date of Service: [name] 07/16/2021 10:00 A M Medical Record Number: [id number] Patient Account Number: [id number] Date of Birth/Sex: Treating RN: 1961/07/05 (60 y.o. [name] Primary Care Ndeye Tenorio: [name] Other Clinician: Haywood Pao Referring  Gideon Burstein: Treating Jerzy Roepke/Extender: Avie Echevaria in Treatment: 6 Encounter Discharge Information Items Discharge Condition: Stable Ambulatory Status: Walker Discharge Destination: Home Transportation: Private Auto Accompanied By: self Schedule Follow-up Appointment: No Clinical Summary of Care: Electronic Signature(s) Signed: 07/16/2021 1:20:13 PM By: Haywood Pao EMT Entered By: Haywood Pao on 07/16/2021 13:14:24 -------------------------------------------------------------------------------- Vitals Details Patient Name: Date of Service: [name] 07/16/2021 10:00 A M Medical Record Number: [id number] Patient Account Number: [id number] Date of Birth/Sex: Treating RN: May 24, 1961 (60 y.o. [name] Primary Care Deborh Pense: [name] Other Clinician: Haywood Pao Referring Verdine Grenfell: Treating Drayton Tieu/Extender: Avie Echevaria in Treatment: 6 Vital Signs Time Taken: 10:17 Temperature (F): 97.6 Height (in): 73 Pulse (bpm): 104 Weight (lbs): 270 Respiratory Rate (breaths/min): 18 Body Mass Index (BMI): 35.6 Blood Pressure (mmHg): 138/76 Capillary Blood Glucose (mg/dl): 119 Reference Range: 80 - 120 mg / dl Electronic Signature(s) Signed: 07/16/2021 12:09:10 PM By: Haywood Pao EMT Entered By: Haywood Pao on 07/16/2021 11:37:11

## 2021-07-17 ENCOUNTER — Encounter (HOSPITAL_BASED_OUTPATIENT_CLINIC_OR_DEPARTMENT_OTHER): Payer: Medicare Other | Admitting: Internal Medicine

## 2021-07-17 DIAGNOSIS — I5032 Chronic diastolic (congestive) heart failure: Secondary | ICD-10-CM | POA: Diagnosis not present

## 2021-07-17 DIAGNOSIS — E11621 Type 2 diabetes mellitus with foot ulcer: Secondary | ICD-10-CM | POA: Diagnosis not present

## 2021-07-17 LAB — GLUCOSE, CAPILLARY
Glucose-Capillary: 163 mg/dL — ABNORMAL HIGH (ref 70–99)
Glucose-Capillary: 139 mg/dL — ABNORMAL HIGH (ref 70–99)

## 2021-07-17 NOTE — Progress Notes (Signed)
VENSON, FERENCZ ([id number]) Visit Report for 07/17/2021 HBO Details Patient Name: Date of Service: [name], [name] 07/17/2021 10:00 A M Medical Record Number: [id number] Patient Account Number: [id number] Date of Birth/Sex: Treating RN: 1961/03/01 (60 y.o. [name] Primary Care Aryeh Butterfield: [name] Other Clinician: Haywood Pao Referring Zakhia Seres: Treating Braylei Totino/Extender: Avie Echevaria in Treatment: 6 HBO Treatment Course Details Treatment Course Number: 1 Ordering [place] Winslow: Baltazar Najjar T Treatments Ordered: otal 40 HBO Treatment Start Date: 07/07/2021 HBO Indication: Chronic Refractory Osteomyelitis to Left,Plantar Foot HBO Treatment Details Treatment Number: 7 Patient Type: Outpatient Chamber Type: Monoplace Chamber Serial #: [id number] Treatment Protocol: 2.5 ATA with 90 minutes oxygen, with two 5 minute air breaks Treatment Details Compression Rate Down: 2.0 psi / minute De-Compression Rate Up: 2.5 psi / minute A breaks and breathing ir Compress Tx Pressure periods Decompress Decompress Begins Reached (leave unused spaces Begins Ends blank) Chamber Pressure (ATA 1 2.5 2.5 2.5 2.5 2.5 - - 2.5 1 ) Clock Time (24 hr) 10:40 10:50 11:20 11:25 11:55 12:00 - - 12:30 12:41 Treatment Length: 121 (minutes) Treatment Segments: 4 Vital Signs Capillary Blood Glucose Reference Range: 80 - 120 mg / dl HBO Diabetic Blood Glucose Intervention Range: <131 mg/dl or >782 mg/dl Time Vitals Blood Respiratory Capillary Blood Glucose Pulse Action Type: Pulse: Temperature: Taken: Pressure: Rate: Glucose (mg/dl): Meter #: Oximetry (%) Taken: Pre 10:24 119/81 102 20 97.8 163 2 Post 12:45 143/80 86 18 97.7 139 2 Treatment Response Treatment Toleration: Well Treatment Completion Status: Treatment Completed without Adverse Event Izamar Linden Notes No concerns with treatment given. Patient was also seen for total contact cast change Physician HBO  Attestation: I certify that I supervised this HBO treatment in accordance with Medicare guidelines. A trained emergency response team is readily available per Yes hospital policies and procedures. Continue HBOT as ordered. Yes Electronic Signature(s) Signed: 07/17/2021 3:46:44 PM By: Baltazar Najjar MD Previous Signature: 07/17/2021 1:23:07 PM Version By: Haywood Pao EMT Entered By: Baltazar Najjar on 07/17/2021 15:45:18 -------------------------------------------------------------------------------- HBO Safety Checklist Details Patient Name: Date of Service: [name] 07/17/2021 10:00 A M Medical Record Number: [id number] Patient Account Number: [id number] Date of Birth/Sex: Treating RN: April 14, 1961 (60 y.o. [name] Primary Care Deshonna Trnka: [name] Other Clinician: Haywood Pao Referring Pernell Dikes: Treating Khalid Lacko/Extender: Avie Echevaria in Treatment: 6 HBO Safety Checklist Items Safety Checklist Consent Form Signed Patient voided / foley secured and emptied NA When did you last eato 0900 Last dose of injectable or oral agent n/a Ostomy pouch emptied and vented if applicable NA All implantable devices assessed, documented and approved NA Intravenous access site secured and place NA Valuables secured Linens and cotton and cotton/polyester blend (less than 51% polyester) Personal oil-based products / skin lotions / body lotions removed Wigs or hairpieces removed NA Smoking or tobacco materials removed NA Books / newspapers / magazines / loose paper removed Cologne, aftershave, perfume and deodorant removed Jewelry removed (may wrap wedding band) Make-up removed NA Hair care products removed NA Battery operated devices (external) removed Heating patches and chemical warmers removed Titanium eyewear removed NA Nail polish cured greater than 10 hours NA Casting material cured greater than 10 hours Cast applied at last  Wound Care Encounter Hearing aids removed NA Loose dentures or partials removed NA Prosthetics have been removed NA Patient demonstrates correct use of air break device (if applicable) Patient concerns have been addressed Patient grounding bracelet on and cord attached to chamber Specifics for Inpatients (complete in  addition to above) Medication sheet sent with patient NA Intravenous medications needed or due during therapy sent with patient NA Drainage tubes (e.g. nasogastric tube or chest tube secured and vented) NA Endotracheal or Tracheotomy tube secured NA Cuff deflated of air and inflated with saline NA Airway suctioned NA Notes Paper version of safety checklist used prior to treatment. Data entered afterward. Electronic Signature(s) Signed: 07/17/2021 1:23:07 PM By: Haywood Pao EMT Entered By: Haywood Pao on 07/17/2021 12:57:08

## 2021-07-17 NOTE — Progress Notes (Signed)
GURPREET, MIKHAIL ([id number]) Visit Report for 07/17/2021 Arrival Information Details Patient Name: Date of Service: ANTINO, MAYABB 07/17/2021 12:30 PM Medical Record Number: [id number] Patient Account Number: [id number] Date of Birth/Sex: Treating RN: [birth year] (60 y.o. [name] Primary Care Ezell Melikian: Billey Gosling Other Clinician: Referring Yitzel Shasteen: Treating Deklan Minar/Extender: Genella Rife in Treatment: 6 Visit Information History Since Last Visit Added or deleted any medications: No Patient Arrived: Ambulatory Any new allergies or adverse reactions: No Arrival Time: 12:54 Had a fall or experienced change in No Accompanied By: self activities of daily living that may affect Transfer Assistance: None risk of falls: Patient Identification Verified: Yes Signs or symptoms of abuse/neglect since last visito No Secondary Verification Process Completed: Yes Hospitalized since last visit: No Patient Requires Transmission-Based Precautions: No Implantable device outside of the clinic excluding No Patient Has Alerts: Yes cellular tissue based products placed in the center since last visit: Has Dressing in Place as Prescribed: Yes Has Footwear/Offloading in Place as Prescribed: Yes Left: T Contact Cast otal Pain Present Now: No Electronic Signature(s) Signed: 07/17/2021 3:32:33 PM By: Deon Pilling RN, BSN Entered By: Deon Pilling on 07/17/2021 12:54:57 -------------------------------------------------------------------------------- Encounter Discharge Information Details Patient Name: Date of Service: [name] 07/17/2021 12:30 PM Medical Record Number: [id number] Patient Account Number: [id number] Date of Birth/Sex: Treating RN: 1960/11/21 (60 y.o. [name] Primary Care Raymonda Pell: Billey Gosling Other Clinician: Referring Zahid Carneiro: Treating Dylin Breeden/Extender: Genella Rife in Treatment: 6 Encounter Discharge Information  Items Discharge Condition: Stable Ambulatory Status: Ambulatory Discharge Destination: Home Transportation: Private Auto Accompanied By: self Schedule Follow-up Appointment: Yes Clinical Summary of Care: Patient Declined Electronic Signature(s) Signed: 07/17/2021 3:32:33 PM By: Deon Pilling RN, BSN Entered By: Deon Pilling on 07/17/2021 13:03:08 -------------------------------------------------------------------------------- Lower Extremity Assessment Details Patient Name: Date of Service: ARLESTER, KEEHAN IG 07/17/2021 12:30 PM Medical Record Number: [id number] Patient Account Number: [id number] Date of Birth/Sex: Treating RN: Dec 06, 1960 (60 y.o. [name] Primary Care Dajsha Massaro: Billey Gosling Other Clinician: Referring Abrey Bradway: Treating Javonni Macke/Extender: Genella Rife in Treatment: 6 Edema Assessment Assessed: Shirlyn Goltz: Yes] [Right: No] Edema: [Left: Ye] [Right: s] Calf Left: Right: Point of Measurement: 36 cm From Medial Instep 42.5 cm Ankle Left: Right: Point of Measurement: 13 cm From Medial Instep 26 cm Vascular Assessment Pulses: Dorsalis Pedis Palpable: [Left:Yes] Posterior Tibial Palpable: [Left:Yes] Electronic Signature(s) Signed: 07/17/2021 3:32:33 PM By: Deon Pilling RN, BSN Entered By: Deon Pilling on 07/17/2021 12:55:16 -------------------------------------------------------------------------------- Multi Wound Chart Details Patient Name: Date of Service: [name] 07/17/2021 12:30 PM Medical Record Number: [id number] Patient Account Number: [id number] Date of Birth/Sex: Treating RN: Sep 02, 1961 (60 y.o. [name] Primary Care Marcelle Hepner: Billey Gosling Other Clinician: Referring Torrez Renfroe: Treating Oliviarose Punch/Extender: Genella Rife in Treatment: 6 Vital Signs Height(in): 73 Capillary Blood Glucose(mg/dl): 139 Weight(lbs): 270 Pulse(bpm): 86 Body Mass Index(BMI): 36 Blood Pressure(mmHg):  143/80 Temperature(F): 97.7 Respiratory Rate(breaths/min): 18 Photos: [3:No Photos Left, Plantar Foot] [N/A:N/A N/A] Wound Location: [3:Gradually Appeared] [N/A:N/A] Wounding Event: [3:Diabetic Wound/Ulcer of the Lower] [N/A:N/A] Primary Etiology: [3:Extremity 03/18/2021] [N/A:N/A] Date Acquired: [3:6] [N/A:N/A] Weeks of Treatment: [3:Open] [N/A:N/A] Wound Status: [3:1.8x2.4x0.2] [N/A:N/A] Measurements L x W x D (cm) [3:3.393] [N/A:N/A] A (cm) : rea [3:0.679] [N/A:N/A] Volume (cm) : [3:52.00%] [N/A:N/A] % Reduction in A rea: [3:4.00%] [N/A:N/A] % Reduction in Volume: [3:Grade 3] [N/A:N/A] Classification: [3:Medium] [N/A:N/A] Exudate A mount: [3:Serosanguineous] [N/A:N/A] Exudate Type: [3:red, brown] [N/A:N/A] Exudate Color: [3:T Contact Cast otal] [N/A:N/A] Treatment Notes Wound #3 (Foot)  Wound Laterality: Plantar, Left Cleanser Soap and Water Discharge Instruction: May shower and wash wound with dial antibacterial soap and water prior to dressing change. Wound Cleanser Discharge Instruction: Cleanse the wound with wound cleanser prior to applying a clean dressing using gauze sponges, not tissue or cotton balls. Peri-Wound Care Topical Primary Dressing Hydrofera Blue Ready Foam, 2.5 x2.5 in Discharge Instruction: Apply to wound bed as instructed Secondary Dressing Woven Gauze Sponge, Non-Sterile 4x4 in Discharge Instruction: Apply over primary dressing as directed. ABD Pad, 5x9 Discharge Instruction: Apply over primary dressing as directed. Secured With [employer], 4.5x3.1 (in/yd) Discharge Instruction: Secure with Kerlix as directed. 40M Medipore H Soft Cloth Surgical T ape, 2x2 (in/yd) Discharge Instruction: Secure dressing with tape as directed. Compression Wrap Compression Stockings Add-Ons Electronic Signature(s) Signed: 07/17/2021 3:46:44 PM By: Linton Ham MD Signed: 07/17/2021 4:13:46 PM By: Baruch Gouty RN, BSN Entered By: Linton Ham on  07/17/2021 13:36:41 -------------------------------------------------------------------------------- Multi-Disciplinary Care Plan Details Patient Name: Date of Service: FLEMON, KELTY IG 07/17/2021 12:30 PM Medical Record Number: [id number] Patient Account Number: [id number] Date of Birth/Sex: Treating RN: 09-15-1961 (60 y.o. Lorette Ang, Meta.Reding Primary Care Ramonita Koenig: Billey Gosling Other Clinician: Referring Desean Heemstra: Treating Cailen Texeira/Extender: Genella Rife in Treatment: 6 Multidisciplinary Care Plan reviewed with physician Active Inactive HBO Nursing Diagnoses: Anxiety related to feelings of confinement associated with the hyperbaric oxygen chamber Anxiety related to knowledge deficit of hyperbaric oxygen therapy and treatment procedures Discomfort related to temperature and humidity changes inside hyperbaric chamber Potential for barotraumas to ears, sinuses, teeth, and lungs or cerebral gas embolism related to changes in atmospheric pressure inside hyperbaric oxygen chamber Potential for oxygen toxicity seizures related to delivery of 100% oxygen at an increased atmospheric pressure Potential for pulmonary oxygen toxicity related to delivery of 100% oxygen at an increased atmospheric pressure Goals: Barotrauma will be prevented during HBO2 Date Initiated: 06/04/2021 T arget Resolution Date: 08/14/2021 Goal Status: Active Patient and/or family will be able to state/discuss factors appropriate to the management of their disease process during treatment Date Initiated: 06/04/2021 T arget Resolution Date: 08/14/2021 Goal Status: Active Patient will tolerate the hyperbaric oxygen therapy treatment Date Initiated: 06/04/2021 T arget Resolution Date: 08/14/2021 Goal Status: Active Patient will tolerate the internal climate of the chamber Date Initiated: 06/04/2021 T arget Resolution Date: 08/14/2021 Goal Status: Active Patient/caregiver will verbalize understanding of  HBO goals, rationale, procedures and potential hazards Date Initiated: 06/04/2021 T arget Resolution Date: 08/14/2021 Goal Status: Active Signs and symptoms of pulmonary oxygen toxicity will be recognized and promptly addressed Date Initiated: 06/04/2021 T arget Resolution Date: 08/14/2021 Goal Status: Active Signs and symptoms of seizure will be recognized and promptly addressed ; seizing patients will suffer no harm Date Initiated: 06/04/2021 T arget Resolution Date: 08/14/2021 Goal Status: Active Interventions: Administer a five (5) minute air break for patient if signs and symptoms of seizure appear and notify the hyperbaric physician Administer decongestants, per physician orders, prior to HBO2 Administer the correct therapeutic gas delivery based on the patients needs and limitations, per physician order Assess and provide for patients comfort related to the hyperbaric environment and equalization of middle ear Assess for signs and symptoms related to adverse events, including but not limited to confinement anxiety, pneumothorax, oxygen toxicity and baurotrauma Assess patient for any history of confinement anxiety Assess patient's knowledge and expectations regarding hyperbaric medicine and provide education related to the hyperbaric environment, goals of treatment and prevention of adverse events Implement protocols to decrease risk of pneumothorax  in high risk patients Notes: Nutrition Nursing Diagnoses: Impaired glucose control: actual or potential Potential for alteratiion in Nutrition/Potential for imbalanced nutrition Goals: Patient/caregiver agrees to and verbalizes understanding of need to use nutritional supplements and/or vitamins as prescribed Date Initiated: 06/04/2021 Date Inactivated: 07/07/2021 Target Resolution Date: 07/04/2021 Goal Status: Met Patient/caregiver will maintain therapeutic glucose control Date Initiated: 06/04/2021 Target Resolution Date:  08/04/2021 Goal Status: Active Interventions: Assess HgA1c results as ordered upon admission and as needed Assess patient nutrition upon admission and as needed per policy Provide education on elevated blood sugars and impact on wound healing Notes: 07/07/21: Glucose control ongoing Wound/Skin Impairment Nursing Diagnoses: Impaired tissue integrity Knowledge deficit related to ulceration/compromised skin integrity Goals: Patient/caregiver will verbalize understanding of skin care regimen Date Initiated: 06/04/2021 Target Resolution Date: 08/04/2021 Goal Status: Active Interventions: Assess patient/caregiver ability to obtain necessary supplies Assess patient/caregiver ability to perform ulcer/skin care regimen upon admission and as needed Assess ulceration(s) every visit Provide education on ulcer and skin care Notes: Electronic Signature(s) Signed: 07/17/2021 3:32:33 PM By: Deon Pilling RN, BSN Entered By: Deon Pilling on 07/17/2021 13:02:02 -------------------------------------------------------------------------------- Pain Assessment Details Patient Name: Date of Service: [name] 07/17/2021 12:30 PM Medical Record Number: [id number] Patient Account Number: [id number] Date of Birth/Sex: Treating RN: 09-08-1961 (60 y.o. [name] Primary Care Reesa Gotschall: Billey Gosling Other Clinician: Referring Ammara Raj: Treating Trystyn Sitts/Extender: Genella Rife in Treatment: 6 Active Problems Location of Pain Severity and Description of Pain Patient Has Paino No Site Locations Pain Management and Medication Current Pain Management: Electronic Signature(s) Signed: 07/17/2021 3:32:33 PM By: Deon Pilling RN, BSN Entered By: Deon Pilling on 07/17/2021 12:55:08 -------------------------------------------------------------------------------- Patient/Caregiver Education Details Patient Name: Date of Service: Emelia Salisbury 9/30/2022andnbsp12:30  PM Medical Record Number: [id number] Patient Account Number: [id number] Date of Birth/Gender: Treating RN: 02-16-1961 (60 y.o. [name] Primary Care Physician: Billey Gosling Other Clinician: Referring Physician: Treating Physician/Extender: Genella Rife in Treatment: 6 Education Assessment Education Provided To: Patient Education Topics Provided Elevated Blood Sugar/ Impact on Healing: Methods: Explain/Verbal Responses: State content correctly Wound/Skin Impairment: Methods: Explain/Verbal Responses: State content correctly Electronic Signature(s) Signed: 07/17/2021 3:32:33 PM By: Deon Pilling RN, BSN Entered By: Deon Pilling on 07/17/2021 12:56:46 -------------------------------------------------------------------------------- Wound Assessment Details Patient Name: Date of Service: [name] 07/17/2021 12:30 PM Medical Record Number: [id number] Patient Account Number: [id number] Date of Birth/Sex: Treating RN: 04-Mar-1961 (60 y.o. [name] Primary Care Seda Kronberg: Billey Gosling Other Clinician: Referring Eleshia Wooley: Treating Anaria Kroner/Extender: Genella Rife in Treatment: 6 Wound Status Wound Number: 3 Primary Etiology: Diabetic Wound/Ulcer of the Lower Extremity Wound Location: Left, Plantar Foot Wound Status: Open Wounding Event: Gradually Appeared Date Acquired: 03/18/2021 Weeks Of Treatment: 6 Clustered Wound: No Wound Measurements Length: (cm) 1.8 Width: (cm) 2.4 Depth: (cm) 0.2 Area: (cm) 3.393 Volume: (cm) 0.679 % Reduction in Area: 52% % Reduction in Volume: 4% Wound Description Classification: Grade 3 Exudate Amount: Medium Exudate Type: Serosanguineous Exudate Color: red, brown Treatment Notes Wound #3 (Foot) Wound Laterality: Plantar, Left Cleanser Soap and Water Discharge Instruction: May shower and wash wound with dial antibacterial soap and water prior to dressing change. Wound  Cleanser Discharge Instruction: Cleanse the wound with wound cleanser prior to applying a clean dressing using gauze sponges, not tissue or cotton balls. Peri-Wound Care Topical Primary Dressing Hydrofera Blue Ready Foam, 2.5 x2.5 in Discharge Instruction: Apply to wound bed as instructed Secondary Dressing Woven Gauze Sponge, Non-Sterile 4x4 in Discharge Instruction: Apply  over primary dressing as directed. ABD Pad, 5x9 Discharge Instruction: Apply over primary dressing as directed. Secured With [employer], 4.5x3.1 (in/yd) Discharge Instruction: Secure with Kerlix as directed. 38M Medipore H Soft Cloth Surgical T ape, 2x2 (in/yd) Discharge Instruction: Secure dressing with tape as directed. Compression Wrap Compression Stockings Add-Ons Electronic Signature(s) Signed: 07/17/2021 3:32:33 PM By: Deon Pilling RN, BSN Entered By: Deon Pilling on 07/17/2021 12:55:43 -------------------------------------------------------------------------------- Vitals Details Patient Name: Date of Service: [name] 07/17/2021 12:30 PM Medical Record Number: [id number] Patient Account Number: [id number] Date of Birth/Sex: Treating RN: 11-Mar-1961 (60 y.o. [name] Primary Care Basilio Meadow: Billey Gosling Other Clinician: Donavan Burnet Referring Paulo Keimig: Treating Allice Garro/Extender: Genella Rife in Treatment: 6 Vital Signs Time Taken: 12:49 Temperature (F): 97.7 Height (in): 73 Pulse (bpm): 86 Weight (lbs): 270 Respiratory Rate (breaths/min): 18 Body Mass Index (BMI): 35.6 Blood Pressure (mmHg): 143/80 Capillary Blood Glucose (mg/dl): 139 Reference Range: 80 - 120 mg / dl Electronic Signature(s) Signed: 07/17/2021 1:23:07 PM By: Donavan Burnet EMT Entered By: Donavan Burnet on 07/17/2021 12:53:06

## 2021-07-17 NOTE — Progress Notes (Signed)
[name], [name] ([id number]) Visit Report for 07/16/2021 SuperBill Details Patient Name: Date of Service: [name], [name] 07/16/2021 Medical Record Number: [id number] Patient Account Number: [id number] Date of Birth/Sex: Treating RN: 07/27/1961 (60 y.o. [name] Primary Care Provider: [name] Other Clinician: Haywood Pao Referring Provider: Treating Provider/Extender: Avie Echevaria in Treatment: 6 Diagnosis Coding ICD-10 Codes Code Description [phone] Type 2 diabetes mellitus with foot ulcer M14.672 Charcot's joint, left ankle and foot L97.528 Non-pressure chronic ulcer of other part of left foot with other specified severity M86.372 Chronic multifocal osteomyelitis, left ankle and foot Facility Procedures CPT4 Code Description Modifier Quantity [id number] G0277-(Facility Use Only) HBOT full body chamber, , 4 ICD-10 Diagnosis Description E11.621 Type 2 diabetes mellitus with foot ulcer M14.672 Charcot's joint, left ankle and foot L97.528 Non-pressure chronic ulcer of other part of left foot with other specified severity M86.372 Chronic multifocal osteomyelitis, left ankle and foot Physician Procedures Quantity CPT4 Code Description Modifier [id number] - WC PHYS HYPERBARIC OXYGEN THERAPY 1 ICD-10 Diagnosis Description E11.621 Type 2 diabetes mellitus with foot ulcer M14.672 Charcot's joint, left ankle and foot L97.528 Non-pressure chronic ulcer of other part of left foot with other specified severity M86.372 Chronic multifocal osteomyelitis, left ankle and foot Electronic Signature(s) Signed: 07/16/2021 1:20:13 PM By: Haywood Pao EMT Signed: 07/17/2021 3:46:44 PM By: Baltazar Najjar MD Entered By: Haywood Pao on 07/16/2021 13:13:55

## 2021-07-17 NOTE — Progress Notes (Signed)
[name], [name] ([id number]) Visit Report for 07/16/2021 HBO Details Patient Name: Date of Service: [name], [name] 07/16/2021 10:00 A M Medical Record Number: [id number] Patient Account Number: [id number] Date of Birth/Sex: Treating RN: [birth year] (60 y.o. [name] Primary Care [name]: [name] Other Clinician: Haywood [name] Referring [name]: Treating [name]/Extender: [name] in Treatment: 6 HBO Treatment Course Details Treatment Course Number: 1 Ordering [name]: [name] T Treatments Ordered: otal 40 HBO Treatment Start Date: 07/07/2021 HBO Indication: Chronic Refractory Osteomyelitis to Left,Plantar Foot HBO Treatment Details Treatment Number: 6 Patient Type: Outpatient Chamber Type: Monoplace Chamber Serial #: [id number] Treatment Protocol: 2.5 ATA with 90 minutes oxygen, with two 5 minute air breaks Treatment Details Compression Rate Down: 2.0 psi / minute De-Compression Rate Up: 2.0 psi / minute A breaks and breathing ir Compress Tx Pressure periods Decompress Decompress Begins Reached (leave unused spaces Begins Ends blank) Chamber Pressure (ATA 1 2.5 2.5 2.5 2.5 2.5 - - 2.5 1 ) Clock Time (24 hr) 10:40 10:51 11:21 11:26 11:56 12:01 - - 12:31 12:42 Treatment Length: 122 (minutes) Treatment Segments: 4 Vital Signs Capillary Blood Glucose Reference Range: 80 - 120 mg / dl HBO Diabetic Blood Glucose Intervention Range: <131 mg/dl or >833 mg/dl Time Vitals Blood Respiratory Capillary Blood Glucose Pulse Action Type: Pulse: Temperature: Taken: Pressure: Rate: Glucose (mg/dl): Meter #: Oximetry (%) Taken: Pre 10:17 138/76 104 18 97.6 148 2 Post 12:48 140/82 88 18 97.8 135 2 Treatment Response Treatment Toleration: Well Treatment Completion Status: Treatment Completed without Adverse Event [name] Notes No concerns with treatment given Physician HBO Attestation: I certify that I supervised this HBO treatment in  accordance with Medicare guidelines. A trained emergency response team is readily available per Yes hospital policies and procedures. Continue HBOT as ordered. Yes Electronic Signature(s) Signed: 07/17/2021 3:46:44 PM By: [name] Najjar MD Previous Signature: 07/16/2021 1:20:13 PM Version By: [name] EMT Entered By: [name] on 07/16/2021 17:27:44 -------------------------------------------------------------------------------- HBO Safety Checklist Details Patient Name: Date of Service: [name] 07/16/2021 10:00 A M Medical Record Number: [id number] Patient Account Number: [id number] Date of Birth/Sex: Treating RN: [name] (60 y.o. [name], [name] Primary Care [name]: [name] Other Clinician: Haywood [name] Referring [name]: Treating [name]/Extender: [name] in Treatment: 6 HBO Safety Checklist Items Safety Checklist Consent Form Signed Patient voided / foley secured and emptied When did you last eato 0900 Last dose of injectable or oral agent n/a Ostomy pouch emptied and vented if applicable NA All implantable devices assessed, documented and approved NA Intravenous access site secured and place NA Valuables secured Linens and cotton and cotton/polyester blend (less than 51% polyester) Personal oil-based products / skin lotions / body lotions removed Wigs or hairpieces removed NA Smoking or tobacco materials removed NA Books / newspapers / magazines / loose paper removed Cologne, aftershave, perfume and deodorant removed Jewelry removed (may wrap wedding band) Make-up removed NA Hair care products removed NA Battery operated devices (external) removed Heating patches and chemical warmers removed Titanium eyewear removed NA Nail polish cured greater than 10 hours NA Casting material cured greater than 10 hours Hearing aids removed NA Loose dentures or partials removed NA Prosthetics have been  removed NA Patient demonstrates correct use of air break device (if applicable) Patient concerns have been addressed Patient grounding bracelet on and cord attached to chamber Specifics for Inpatients (complete in addition to above) Medication sheet sent with patient NA Intravenous medications needed or due during therapy sent  with patient NA Drainage tubes (e.g. nasogastric tube or chest tube secured and vented) NA Endotracheal or Tracheotomy tube secured NA Cuff deflated of air and inflated with saline NA Airway suctioned NA Notes Paper version of safety checklist used prior to treatment start. Data entered afterward. Electronic Signature(s) Signed: 07/16/2021 1:20:13 PM By: [name] EMT Entered By: [name] on 07/16/2021 13:11:04

## 2021-07-17 NOTE — Progress Notes (Signed)
[name], [name] ([id number]) Visit Report for 07/17/2021 SuperBill Details Patient Name: Date of Service: [name], [name] 07/17/2021 Medical Record Number: [id number] Patient Account Number: [id number] Date of Birth/Sex: Treating RN: 07-09-1961 (60 y.o. [name] Primary Care Provider: [name] Other Clinician: Haywood Pao Referring Provider: Treating Provider/Extender: Avie Echevaria in Treatment: 6 Diagnosis Coding ICD-10 Codes Code Description E11.621 Type 2 diabetes mellitus with foot ulcer M14.672 Charcot's joint, left ankle and foot L97.528 Non-pressure chronic ulcer of other part of left foot with other specified severity M86.372 Chronic multifocal osteomyelitis, left ankle and foot Facility Procedures CPT4 Code Description Modifier Quantity [id number] G0277-(Facility Use Only) HBOT full body chamber, , 4 ICD-10 Diagnosis Description E11.621 Type 2 diabetes mellitus with foot ulcer M14.672 Charcot's joint, left ankle and foot L97.528 Non-pressure chronic ulcer of other part of left foot with other specified severity M86.372 Chronic multifocal osteomyelitis, left ankle and foot Physician Procedures Quantity CPT4 Code Description Modifier [id number] - WC PHYS HYPERBARIC OXYGEN THERAPY 1 ICD-10 Diagnosis Description E11.621 Type 2 diabetes mellitus with foot ulcer M14.672 Charcot's joint, left ankle and foot L97.528 Non-pressure chronic ulcer of other part of left foot with other specified severity M86.372 Chronic multifocal osteomyelitis, left ankle and foot Electronic Signature(s) Signed: 07/17/2021 1:23:07 PM By: Haywood Pao EMT Signed: 07/17/2021 3:46:44 PM By: Baltazar Najjar MD Entered By: Haywood Pao on 07/17/2021 13:19:13

## 2021-07-17 NOTE — Progress Notes (Signed)
[name], [name] ([id number]) Visit Report for 07/17/2021 HPI Details Patient Name: Date of Service: [name] 07/17/2021 12:30 PM Medical Record Number: [id number] Patient Account Number: [id number] Date of Birth/Sex: Treating RN: [birth year] (60 y.o. [name] Primary Care Provider: Cheryll [name] Other Clinician: Referring Provider: Treating Provider/Extender: [name] in Treatment: 6 History of Present Illness HPI Description: 10/31/2019 upon evaluation today patient appears to be doing somewhat poorly in regard to his bilateral plantar feet. He has wounds that he tells me have been present since 2012 intermittently off and on. Most recently this has been open for at least the past 6 months to a year. He has been trying to treat this in different ways using Santyl along with various other dressings including Medihoney and even at one point Xeroform. Nothing really has seem to get this completely closed. He was recently in the hospital for cellulitis of his leg subsequently he did have x-rays as well as MRIs that showed negative for any signs of osteomyelitis in regard to the wounds on his feet. Fortunately there is no signs of systemic infection at this time. No fevers, chills, nausea, vomiting, or diarrhea. Patient has previously used Darco offloading shoes as far as frontal floaters as well as postop surgical shoes. He has never been in a total contact cast that may be something we need to strongly consider here. Patient's most recent hemoglobin A1c 1 month ago was 5.3 seems to be very well controlled which is great. Subsequently he has seen vascular as well as podiatry. His ABIs are 1.07 on the left and 1.14 on the right he seems to be doing well he does have chronic venous stasis. 11/07/2019 upon evaluation today patient appears to be doing well with regard to his wounds all things considered. I do not see any severe worsening he still has some callus  buildup on the right more than the left he notes that he has been probably more active than he should as far as walking is concerned is just very hard to not be active. He knows he needs to be more careful in this regard however. He is willing to give the cast a try at this point although he notes that he is a little nervous about this just with regard to balance although he will be very careful and obviously if he has any trouble he knows to contact the office and let me know. 1/22; patient is in for his obligatory first total contact cast change. Our intake nurse reported a very large amount of drainage which is spelled out over to the surrounding skin. Has bilateral diabetic foot wounds. He has Charcot feet. We have been using silver alginate on his wounds. 11/14/2019 on evaluation today patient is actually seeming to make good improvement in regard to his bilateral plantar foot wounds. We have been using a cast on the left side and on the right side he has been using dressings he is changing up his own accord. With that being said he tells me that he is also not walking as much just due to how unsteady he feels. He takes it easy when he does have to walk and when he does not have to walk he is resting. This is probably help in his right foot as well has the left foot which is actually measuring better. In fact both are measuring better. Overall I am very pleased with how things seem to be progressing. The patient does have some  odor on the left foot this does have me concerned about the possibility of infection, and actually probably go ahead and put him on antibiotics today as well as utilizing a continuation of the cast on the left foot I think that will be fine we probably just need to bring him in sooner to change this not last a whole week. 1/29; we brought the patient back today for a total contact cast change on the left out of concern for excessive drainage. We are using drawtex over  the wound as the primary dressing 11/21/2019 on evaluation today patient appears to be doing well with regard to his left plantar foot. In fact both foot ulcers actually seem to be doing pretty well. Nonetheless he is having a lot of drainage on the left at this time and again we did obtain a wound culture did show positive for Staph aureus that was reviewed by myself today as well. Nonetheless he is on Bactrim which was shown to be sensitive that should be helping in this regard. Fortunately there is no signs of infection systemically at this point. 2/5; back in clinic today for a total contact cast change apparently secondary to very significant drainage. Still using drawtex 11/28/2019 upon evaluation today patient appears to be doing well with regard to his wounds. The right foot is doing okay as measured about the same in my opinion. The left foot is actually showing signs of significant improvement is measuring smaller there is a lot of hyper granulation likely due to the continued drainage at this point. We did obtain approval for a snap VAC I think that is good to be appropriate for him and will likely help this tremendously underneath the cast. He is definitely in agreement with proceeding with such. 2/12; patient came in today after his snap VAC lost suction. Brought in to see one of our nurses. The dressing was replaced and then we put the cast back on and rehooked up the snap VAC. Apparently his wound looked very good per our intake nurse. 2/15; again we replaced the cast on Friday. By Saturday the snap VAC and light suction. He called this morning he comes in acutely. The wounds look fine however the VAC is not functioning. We replaced the cast using silver alginate as the primary dressing backed with Kerramax. The snap VAC was not replaced 12/05/2019 upon evaluation today patient appears to be doing better in regard to his left plantar foot ulcer. Fortunately there is no signs of active  infection at this time. Unfortunately he is continuing to have issues with the right foot he is really not making any progress here things seem to be somewhat stagnant to be honest. The depth has increased but that is due to me having debrided the wound in the past based on what I am seeing. 12/12/2019 on evaluation today patient appears to be doing more poorly in regard to the left lower extremity. He has some erythema spreading up the side of his foot I am concerned about infection again at this point. Unfortunately he has been seeing improvement with a total contact cast but I do not think we should put that on today. On his right plantar foot he continues to have significant drainage this is actually measuring deeper I really do not feel like you are making any progress whatsoever. I have prescribed Granix for him unfortunately his insurance apparently was going to cost him a $500 co-pay. 12/19/2019 upon evaluation today patient actually appears to  be doing better in regard to both wounds. With that being said he actually did get the reGranix which he had to pay $500 for. With that being said it does look like that he is actually made some improvement based on what I am seeing at this point with the reGranix. Obviously if he is going to continue this we are going to do something about trying to get him some help in covering the cost. 12/26/2019 on evaluation today patient appears to be doing really much better even compared to last week. Overall the wound seems to be much better even compared to last week and last week was better than the week before. Since has been using the reGranix his symptoms have improved significantly. With that being said the issue right now is simply that this is a very expensive medication for him the first dose cost him $500. Upon inspection patient's wound bed actually is however dramatically improved compared to before he started this 2 weeks ago. 01/02/2020 upon evaluation  today patient appears to actually be doing well. He still had a little bit of reGranix left that has been using in small amounts he just been applying it every other day instead of every day in order to make it go longer. Overall we are still seeing excellent improvement he is measuring smaller looking better healthier tissue and everything seems to be pointing to this headed in the right direction. Fortunately there is no evidence of infection either which is also excellent news. He does have his MRI coming up within the next week. 01/09/2020 upon evaluation today patient appears to be doing a little worse today compared to previous week's evaluation. He is actually been out of the reGranix at this point. He has been trying to make the stretch out so he has been changing the dressings on a regular set schedule like he was previous. I think this has made a difference. Fortunately there is no signs of active infection at this time. No fevers, chills, nausea, vomiting, or diarrhea. 01/16/2020 upon evaluation today patient appears to be doing well with regard to his left plantar foot ulcer. The right plantar foot still shows some significant depth at this point. Fortunately there is no signs of active infection at this time. 01/30/2020 upon evaluation today patient appears to be doing about the same in regard to his right plantar foot ulcer there is still some depth here and we had to wait till he actually switched over to his new insurance to get approval for the MRI under his new insurance plan. With that being said he now has switched as of April 1. Fortunately there is no signs of active infection at this time. Overall in regard to his left foot ulcer this seems to be doing much better and I am actually very pleased with how things are going. With that being said it is not quite as much progress as we were seeing with the reGranix but at the same time he has had trouble getting this apparently there is  been some hindrance here. I Georgina Peer try to actually send this to [facility] that was recommended by the drug rep to me. 02/13/20 upon evaluation today patient appears to be doing better in regard to his left foot ulcer this is great news. Unfortunately the right foot ulcer is not really significantly better at this time. There is no signs of active infection systemically though he did have his MRI which showed unfortunately he does have  infection noted including an abscess in the foot. There is also marrow changes noted which are consistent with osteomyelitis based on the radiology review and interpretation. Unfortunately considering that the wound is not really making the progress that we will he would like to be seen I think that this is an indication that he may need some further referral both infectious disease as well as potentially to podiatrist to see if there is anything that can be done to help with the situation that were dealing with here. The left foot again is doing great. READMISSION 06/04/2021 This is a 60 year old man who was in the clinic in 2021 followed by Jeri Cos for areas on the right and left foot. He developed a left foot infection and was referred to ID. He left the clinic in a nonhealed state and was followed for a period of time [facility] Cannon Kettle. Apparently things really deteriorated in early July when he was admitted to hospital from 04/27/2021 through 05/06/2021 with sepsis secondary to a left foot infection. His blood cultures were negative. An MRI suggested fifth metatarsal osteomyelitis a left ankle septic joint. He was treated with vancomycin and ertapenem which he is still taking and may just about be finishing. He was seen by orthopedics and the patient adamantly refused to BKA. As far as I can tell he did not have the ankle aspirated I am not exactly sure what the issue was here. Since he has been discharged she is at [facility] for  rehabilitation. He was last seen by Dr. Drucilla Schmidt on 05/20/2021 he noted osteo of the tibial talar bone cuboid and fifth metatarsal which is even more extensive than what was suggested by the MRI. He is apparently going for a consultation with orthopedic surgery in [place] sometime next week. I received a call about this man 2 weeks ago from Dr. Gwenlyn Found who follows him for the possibility of PAD. ABIs I think done in the office showed a ABI on the right of 1.05 at the PTA and 0.99 at the PTA on the left. He had a DVT rule out in the left leg that was negative for the DVT. Past medical history is extensive and includes diastolic heart failure, right first metatarsal head ulcer in 2021, excision of the right second ray by Dr. Cannon Kettle on 03/13/2020, hypertension, hypothyroidism. Left total hip replacement, right total knee replacement, carpal tunnel syndrome, obstructive sleep apnea alcohol abuse with cirrhosis although the patient denies current alcohol intake. The patient does not think he is a diabetic however looking through [place] link I see 2 HgbAic's of this year that were greater than equal to 6.5 which by definition makes him diabetic. Nevertheless he is not on any treatment and does not check his blood sugars. The patient is now back home out of the nursing home. Saw Dr. Drucilla Schmidt last week he was taken off vancomycin and ertapenem on August 22 and now is on doxycycline on Augmentin. He also saw Dr. Buford Dresser who is his orthopedic surgeon in [place] he recommended a [employer]. He has been using Medihoney. 9/6I have been having trouble getting hyperbarics approved through our prior authorization process. Even though he had a limb threatening infection in the left foot and probably the left ankle there glitches in how some of the reports are worded also some of the consultants. In any case I am going to repeat his sedimentation rate and C-reactive protein. I am generally not in favor of doing  things  like this as they really do not alter the plan of care from my point of view however I am going to need to demonstrate that these remain high in order to get this through forhyperbaric treatment for chronic refractory osteomyelitis 9/13; following this man for a wound on his left plantar foot in the setting of type 2 diabetes and Charcot deformity. He has underlying chronic refractory osteomyelitis. Follow-up sedimentation rate and C-reactive protein were both elevated but the C-reactive protein was down to 1.4, sedimentation rate at 70. Sedimentation rate was only slightly down from previous at 85. His wound is measuring slightly smaller. 9/20; patient started hyperbaric oxygen therapy today and tolerated treatment well. This is for the underlying osteomyelitis. He remains on antibiotics but thinks he is getting close to finishing. The wound on the plantar aspect of his foot is the other issue we are following here. He is using Medihoney The patient has a Charcot foot in the setting of type 2 diabetes. He is going to need a total contact cast although his partner was away this week and we elected to delay this till next week 9/27; patient still tolerating hyperbaric oxygen well. Wound looked generally healthy not much depth under illumination still 100% covered in fibrinous debris. Raised callused edges around the wound he was prepared for a total contact cast. We have been using Medihoney 9/30; patient is back for his first obligatory total contact cast change. We are using Hydrofera Blue. Noted by our intake nurse to have a lot of drainage or at least a moderate amount of drainage. I am not sure I was previously aware of this Electronic Signature(s) Signed: 07/17/2021 3:46:44 PM By: Baltazar Najjar MD Entered By: Baltazar Najjar on 07/17/2021 13:38:37 -------------------------------------------------------------------------------- Physician Orders Details Patient Name: Date of Service: [name], [name] 07/17/2021 12:30 PM Medical Record Number: [id number] Patient Account Number: [id number] Date of Birth/Sex: Treating RN: 1961-09-27 (60 y.o. [name] Primary Care Provider: Cheryll [name] Other Clinician: Referring Provider: Treating Provider/Extender: [name] in Treatment: 6 Verbal / Phone Orders: No Diagnosis Coding Follow-up Appointments ppointment in 1 week. - Tuesday Dr. Leanord Hawking Return A Bathing/ Shower/ Hygiene May shower and wash wound with soap and water. - prior to dressing change Edema Control - Lymphedema / SCD / Other Elevate legs to the level of the heart or above for 30 minutes daily and/or when sitting, a frequency of: - throughout the day Avoid standing for long periods of time. Exercise regularly Moisturize legs daily. Off-Loading Total Contact Cast to Left Lower Extremity - Size 4 Additional Orders / Instructions Follow Nutritious Diet Hyperbaric Oxygen Therapy Wound #3 Left,Plantar Foot Evaluate for HBO Therapy - Starts 07/07/21 Indication: - Chronic Refractory Osteomyelitis 2.5 ATA for 90 Minutes with 2 Five (5) Minute A Breaks ir Total Number of Treatments: - 40 One treatments per day (delivered Monday through Friday unless otherwise specified in Special Instructions below): Finger stick Blood Glucose Pre- and Post- HBOT Treatment. Follow Hyperbaric Oxygen Glycemia Protocol Afrin (Oxymetazoline HCL) 0.05% nasal spray - 1 spray in both nostrils daily as needed prior to HBO treatment for difficulty clearing ears Wound Treatment Wound #3 - Foot Wound Laterality: Plantar, Left Cleanser: Soap and Water 1 x Per Day Discharge Instructions: May shower and wash wound with dial antibacterial soap and water prior to dressing change. Cleanser: Wound Cleanser 1 x Per Day Discharge Instructions: Cleanse the wound with wound cleanser prior to applying a clean dressing using gauze  sponges, not tissue or cotton balls. Prim Dressing:  Hydrofera Blue Ready Foam, 2.5 x2.5 in 1 x Per Day ary Discharge Instructions: Apply to wound bed as instructed Secondary Dressing: Woven Gauze Sponge, Non-Sterile 4x4 in 1 x Per Day Discharge Instructions: Apply over primary dressing as directed. Secondary Dressing: ABD Pad, 5x9 1 x Per Day Discharge Instructions: Apply over primary dressing as directed. Secured With: [employer], 4.5x3.1 (in/yd) 1 x Per Day Discharge Instructions: Secure with Kerlix as directed. Secured With: 67M Medipore H Soft Cloth Surgical Tape, 2x2 (in/yd) 1 x Per Day Discharge Instructions: Secure dressing with tape as directed. GLYCEMIA INTERVENTIONS PROTOCOL PRE-HBO GLYCEMIA INTERVENTIONS ACTION INTERVENTION Obtain pre-HBO capillary blood glucose (ensure 1 physician order is in chart). A. Notify HBO physician and await physician orders. 2 If result is 70 mg/dl or below: B. If the result meets the hospital definition of a critical result, follow hospital policy. A. Give patient an 8 ounce Glucerna Shake, an 8 ounce Ensure, or 8 ounces of a Glucerna/Ensure equivalent dietary supplement*. B. Wait 30 minutes. If result is 71 mg/dl to 161 mg/dl: C. Retest patients capillary blood glucose (CBG). D. If result greater than or equal to 110 mg/dl, proceed with HBO. If result less than 110 mg/dl, notify HBO physician and consider holding HBO. If result is 131 mg/dl to 096 mg/dl: A. Proceed with HBO. A. Notify HBO physician and await physician orders. B. It is recommended to hold HBO and do If result is 250 mg/dl or greater: blood/urine ketone testing. C. If the result meets the hospital definition of a critical result, follow hospital policy. POST-HBO GLYCEMIA INTERVENTIONS ACTION INTERVENTION Obtain post HBO capillary blood glucose (ensure 1 physician order is in chart). A. Notify HBO physician and await physician orders. 2 If result is 70 mg/dl or below: B. If the result meets the hospital  definition of a critical result, follow hospital policy. A. Give patient an 8 ounce Glucerna Shake, an 8 ounce Ensure, or 8 ounces of a Glucerna/Ensure equivalent dietary supplement*. B. Wait 15 minutes for symptoms of If result is 71 mg/dl to 045 mg/dl: hypoglycemia (i.e. nervousness, anxiety, sweating, chills, clamminess, irritability, confusion, tachycardia or dizziness). C. If patient asymptomatic, discharge patient. If patient symptomatic, repeat capillary blood glucose (CBG) and notify HBO physician. If result is 101 mg/dl to 409 mg/dl: A. Discharge patient. A. Notify HBO physician and await physician orders. B. It is recommended to do blood/urine ketone If result is 250 mg/dl or greater: testing. C. If the result meets the hospital definition of a critical result, follow hospital policy. *Juice or candies are NOT equivalent products. If patient refuses the Glucerna or Ensure, please consult the hospital dietitian for an appropriate substitute. Electronic Signature(s) Signed: 07/17/2021 3:32:33 PM By: Shawn Stall RN, BSN Signed: 07/17/2021 3:46:44 PM By: Baltazar Najjar MD Entered By: Shawn Stall on 07/17/2021 13:01:18 -------------------------------------------------------------------------------- Problem List Details Patient Name: Date of Service: [name], [name] 07/17/2021 12:30 PM Medical Record Number: [id number] Patient Account Number: [id number] Date of Birth/Sex: Treating RN: May 30, 1961 (60 y.o. [name] Primary Care Provider: Cheryll [name] Other Clinician: Referring Provider: Treating Provider/Extender: [name] in Treatment: 6 Active Problems ICD-10 Encounter Code Description Active Date MDM Diagnosis E11.621 Type 2 diabetes mellitus with foot ulcer 06/04/2021 No Yes M14.672 Charcot's joint, left ankle and foot 06/04/2021 No Yes L97.528 Non-pressure chronic ulcer of other part of left foot with other specified  06/04/2021 No Yes severity M86.372 Chronic multifocal osteomyelitis, left  ankle and foot 06/04/2021 No Yes Inactive Problems Resolved Problems Electronic Signature(s) Signed: 07/17/2021 3:46:44 PM By: Baltazar Najjar MD Entered By: Baltazar Najjar on 07/17/2021 13:36:34 -------------------------------------------------------------------------------- Progress Note Details Patient Name: Date of Service: [name], [name] 07/17/2021 12:30 PM Medical Record Number: [id number] Patient Account Number: [id number] Date of Birth/Sex: Treating RN: 12/31/1960 (60 y.o. [name] Primary Care Provider: Cheryll [name] Other Clinician: Referring Provider: Treating Provider/Extender: [name] in Treatment: 6 Subjective History of Present Illness (HPI) 10/31/2019 upon evaluation today patient appears to be doing somewhat poorly in regard to his bilateral plantar feet. He has wounds that he tells me have been present since 2012 intermittently off and on. Most recently this has been open for at least the past 6 months to a year. He has been trying to treat this in different ways using Santyl along with various other dressings including Medihoney and even at one point Xeroform. Nothing really has seem to get this completely closed. He was recently in the hospital for cellulitis of his leg subsequently he did have x-rays as well as MRIs that showed negative for any signs of osteomyelitis in regard to the wounds on his feet. Fortunately there is no signs of systemic infection at this time. No fevers, chills, nausea, vomiting, or diarrhea. Patient has previously used Darco offloading shoes as far as frontal floaters as well as postop surgical shoes. He has never been in a total contact cast that may be something we need to strongly consider here. Patient's most recent hemoglobin A1c 1 month ago was 5.3 seems to be very well controlled which is great. Subsequently he has seen vascular as  well as podiatry. His ABIs are 1.07 on the left and 1.14 on the right he seems to be doing well he does have chronic venous stasis. 11/07/2019 upon evaluation today patient appears to be doing well with regard to his wounds all things considered. I do not see any severe worsening he still has some callus buildup on the right more than the left he notes that he has been probably more active than he should as far as walking is concerned is just very hard to not be active. He knows he needs to be more careful in this regard however. He is willing to give the cast a try at this point although he notes that he is a little nervous about this just with regard to balance although he will be very careful and obviously if he has any trouble he knows to contact the office and let me know. 1/22; patient is in for his obligatory first total contact cast change. Our intake nurse reported a very large amount of drainage which is spelled out over to the surrounding skin. Has bilateral diabetic foot wounds. He has Charcot feet. We have been using silver alginate on his wounds. 11/14/2019 on evaluation today patient is actually seeming to make good improvement in regard to his bilateral plantar foot wounds. We have been using a cast on the left side and on the right side he has been using dressings he is changing up his own accord. With that being said he tells me that he is also not walking as much just due to how unsteady he feels. He takes it easy when he does have to walk and when he does not have to walk he is resting. This is probably help in his right foot as well has the left foot which is actually measuring better. In  fact both are measuring better. Overall I am very pleased with how things seem to be progressing. The patient does have some odor on the left foot this does have me concerned about the possibility of infection, and actually probably go ahead and put him on antibiotics today as well as utilizing a  continuation of the cast on the left foot I think that will be fine we probably just need to bring him in sooner to change this not last a whole week. 1/29; we brought the patient back today for a total contact cast change on the left out of concern for excessive drainage. We are using drawtex over the wound as the primary dressing 11/21/2019 on evaluation today patient appears to be doing well with regard to his left plantar foot. In fact both foot ulcers actually seem to be doing pretty well. Nonetheless he is having a lot of drainage on the left at this time and again we did obtain a wound culture did show positive for Staph aureus that was reviewed by myself today as well. Nonetheless he is on Bactrim which was shown to be sensitive that should be helping in this regard. Fortunately there is no signs of infection systemically at this point. 2/5; back in clinic today for a total contact cast change apparently secondary to very significant drainage. Still using drawtex 11/28/2019 upon evaluation today patient appears to be doing well with regard to his wounds. The right foot is doing okay as measured about the same in my opinion. The left foot is actually showing signs of significant improvement is measuring smaller there is a lot of hyper granulation likely due to the continued drainage at this point. We did obtain approval for a snap VAC I think that is good to be appropriate for him and will likely help this tremendously underneath the cast. He is definitely in agreement with proceeding with such. 2/12; patient came in today after his snap VAC lost suction. Brought in to see one of our nurses. The dressing was replaced and then we put the cast back on and rehooked up the snap VAC. Apparently his wound looked very good per our intake nurse. 2/15; again we replaced the cast on Friday. By Saturday the snap VAC and light suction. He called this morning he comes in acutely. The wounds look fine however  the VAC is not functioning. We replaced the cast using silver alginate as the primary dressing backed with Kerramax. The snap VAC was not replaced 12/05/2019 upon evaluation today patient appears to be doing better in regard to his left plantar foot ulcer. Fortunately there is no signs of active infection at this time. Unfortunately he is continuing to have issues with the right foot he is really not making any progress here things seem to be somewhat stagnant to be honest. The depth has increased but that is due to me having debrided the wound in the past based on what I am seeing. 12/12/2019 on evaluation today patient appears to be doing more poorly in regard to the left lower extremity. He has some erythema spreading up the side of his foot I am concerned about infection again at this point. Unfortunately he has been seeing improvement with a total contact cast but I do not think we should put that on today. On his right plantar foot he continues to have significant drainage this is actually measuring deeper I really do not feel like you are making any progress whatsoever. I have prescribed Granix  for him unfortunately his insurance apparently was going to cost him a $500 co-pay. 12/19/2019 upon evaluation today patient actually appears to be doing better in regard to both wounds. With that being said he actually did get the reGranix which he had to pay $500 for. With that being said it does look like that he is actually made some improvement based on what I am seeing at this point with the reGranix. Obviously if he is going to continue this we are going to do something about trying to get him some help in covering the cost. 12/26/2019 on evaluation today patient appears to be doing really much better even compared to last week. Overall the wound seems to be much better even compared to last week and last week was better than the week before. Since has been using the reGranix his symptoms have improved  significantly. With that being said the issue right now is simply that this is a very expensive medication for him the first dose cost him $500. Upon inspection patient's wound bed actually is however dramatically improved compared to before he started this 2 weeks ago. 01/02/2020 upon evaluation today patient appears to actually be doing well. He still had a little bit of reGranix left that has been using in small amounts he just been applying it every other day instead of every day in order to make it go longer. Overall we are still seeing excellent improvement he is measuring smaller looking better healthier tissue and everything seems to be pointing to this headed in the right direction. Fortunately there is no evidence of infection either which is also excellent news. He does have his MRI coming up within the next week. 01/09/2020 upon evaluation today patient appears to be doing a little worse today compared to previous week's evaluation. He is actually been out of the reGranix at this point. He has been trying to make the stretch out so he has been changing the dressings on a regular set schedule like he was previous. I think this has made a difference. Fortunately there is no signs of active infection at this time. No fevers, chills, nausea, vomiting, or diarrhea. 01/16/2020 upon evaluation today patient appears to be doing well with regard to his left plantar foot ulcer. The right plantar foot still shows some significant depth at this point. Fortunately there is no signs of active infection at this time. 01/30/2020 upon evaluation today patient appears to be doing about the same in regard to his right plantar foot ulcer there is still some depth here and we had to wait till he actually switched over to his new insurance to get approval for the MRI under his new insurance plan. With that being said he now has switched as of April 1. Fortunately there is no signs of active infection at this time.  Overall in regard to his left foot ulcer this seems to be doing much better and I am actually very pleased with how things are going. With that being said it is not quite as much progress as we were seeing with the reGranix but at the same time he has had trouble getting this apparently there is been some hindrance here. I Ernie Hew try to actually send this to [facility] that was recommended by the drug rep to me. 02/13/20 upon evaluation today patient appears to be doing better in regard to his left foot ulcer this is great news. Unfortunately the right foot ulcer is not really significantly better at  this time. There is no signs of active infection systemically though he did have his MRI which showed unfortunately he does have infection noted including an abscess in the foot. There is also marrow changes noted which are consistent with osteomyelitis based on the radiology review and interpretation. Unfortunately considering that the wound is not really making the progress that we will he would like to be seen I think that this is an indication that he may need some further referral both infectious disease as well as potentially to podiatrist to see if there is anything that can be done to help with the situation that were dealing with here. The left foot again is doing great. READMISSION 06/04/2021 This is a 60 year old man who was in the clinic in 2021 followed by Allen Derry for areas on the right and left foot. He developed a left foot infection and was referred to ID. He left the clinic in a nonhealed state and was followed for a period of [facility] Marylene Land. Apparently things really deteriorated in early July when he was admitted to hospital from 04/27/2021 through 05/06/2021 with sepsis secondary to a left foot infection. His blood cultures were negative. An MRI suggested fifth metatarsal osteomyelitis a left ankle septic joint. He was treated with vancomycin and ertapenem  which he is still taking and may just about be finishing. He was seen by orthopedics and the patient adamantly refused to BKA. As far as I can tell he did not have the ankle aspirated I am not exactly sure what the issue was here. Since he has been discharged she is at [facility] health care for rehabilitation. He was last seen by Dr. Algis Liming on 05/20/2021 he noted osteo of the tibial talar bone cuboid and fifth metatarsal which is even more extensive than what was suggested by the MRI. He is apparently going for a consultation with orthopedic surgery in [place] sometime next week. I received a call about this man 2 weeks ago from Dr. Allyson Sabal who follows him for the possibility of PAD. ABIs I think done in the office showed a ABI on the right of 1.05 at the PTA and 0.99 at the PTA on the left. He had a DVT rule out in the left leg that was negative for the DVT. Past medical history is extensive and includes diastolic heart failure, right first metatarsal head ulcer in 2021, excision of the right second ray by Dr. Marylene Land on 03/13/2020, hypertension, hypothyroidism. Left total hip replacement, right total knee replacement, carpal tunnel syndrome, obstructive sleep apnea alcohol abuse with cirrhosis although the patient denies current alcohol intake. The patient does not think he is a diabetic however looking through [place] link I see 2 HgbAic's of this year that were greater than equal to 6.5 which by definition makes him diabetic. Nevertheless he is not on any treatment and does not check his blood sugars. The patient is now back home out of the nursing home. Saw Dr. Algis Liming last week he was taken off vancomycin and ertapenem on August 22 and now is on doxycycline on Augmentin. He also saw Dr. Weston Anna who is his orthopedic surgeon in [place] he recommended a [employer]	[place]. He has been using Medihoney. 9/6I have been having trouble getting hyperbarics approved through our prior authorization process. Even  though he had a limb threatening infection in the left foot and probably the left ankle there glitches in how some of the reports are worded also some of the  consultants. In any case I am going to repeat his sedimentation rate and C-reactive protein. I am generally not in favor of doing things like this as they really do not alter the plan of care from my point of view however I am going to need to demonstrate that these remain high in order to get this through forhyperbaric treatment for chronic refractory osteomyelitis 9/13; following this man for a wound on his left plantar foot in the setting of type 2 diabetes and Charcot deformity. He has underlying chronic refractory osteomyelitis. Follow-up sedimentation rate and C-reactive protein were both elevated but the C-reactive protein was down to 1.4, sedimentation rate at 70. Sedimentation rate was only slightly down from previous at 85. His wound is measuring slightly smaller. 9/20; patient started hyperbaric oxygen therapy today and tolerated treatment well. This is for the underlying osteomyelitis. He remains on antibiotics but thinks he is getting close to finishing. The wound on the plantar aspect of his foot is the other issue we are following here. He is using Medihoney The patient has a Charcot foot in the setting of type 2 diabetes. He is going to need a total contact cast although his partner was away this week and we elected to delay this till next week 9/27; patient still tolerating hyperbaric oxygen well. Wound looked generally healthy not much depth under illumination still 100% covered in fibrinous debris. Raised callused edges around the wound he was prepared for a total contact cast. We have been using Medihoney 9/30; patient is back for his first obligatory total contact cast change. We are using Hydrofera Blue. Noted by our intake nurse to have a lot of drainage or at least a moderate amount of drainage. I am not sure I was  previously aware of this Objective Constitutional Vitals Time Taken: 12:49 PM, Height: 73 in, Weight: 270 lbs, BMI: 35.6, Temperature: 97.7 F, Pulse: 86 bpm, Respiratory Rate: 18 breaths/min, Blood Pressure: 143/80 mmHg, Capillary Blood Glucose: 139 mg/dl. Integumentary (Hair, Skin) Wound #3 status is Open. Original cause of wound was Gradually Appeared. The date acquired was: 03/18/2021. The wound has been in treatment 6 weeks. The wound is located on the Left,Plantar Foot. The wound measures 1.8cm length x 2.4cm width x 0.2cm depth; 3.393cm^2 area and 0.679cm^3 volume. There is a medium amount of serosanguineous drainage noted. Assessment Active Problems ICD-10 Type 2 diabetes mellitus with foot ulcer Charcot's joint, left ankle and foot Non-pressure chronic ulcer of other part of left foot with other specified severity Chronic multifocal osteomyelitis, left ankle and foot Procedures Wound #3 Pre-procedure diagnosis of Wound #3 is a Diabetic Wound/Ulcer of the Lower Extremity located on the Left,Plantar Foot . There was a T Contact Cast otal Procedure by Maxwell Caul., MD. Post procedure Diagnosis Wound #3: Same as Pre-Procedure Plan Follow-up Appointments: Return Appointment in 1 week. - Tuesday Dr. Leanord Hawking Bathing/ Shower/ Hygiene: May shower and wash wound with soap and water. - prior to dressing change Edema Control - Lymphedema / SCD / Other: Elevate legs to the level of the heart or above for 30 minutes daily and/or when sitting, a frequency of: - throughout the day Avoid standing for long periods of time. Exercise regularly Moisturize legs daily. Off-Loading: T Contact Cast to Left Lower Extremity - Size 4 otal Additional Orders / Instructions: Follow Nutritious Diet Hyperbaric Oxygen Therapy: Wound #3 Left,Plantar Foot: Evaluate for HBO Therapy - Starts 07/07/21 Indication: - Chronic Refractory Osteomyelitis 2.5 ATA for 90 Minutes with 2 Five (  5) Minute Air  Breaks T Number of Treatments: - 40 otal One treatments per day (delivered Monday through Friday unless otherwise specified in Special Instructions below): Finger stick Blood Glucose Pre- and Post- HBOT Treatment. Follow Hyperbaric Oxygen Glycemia Protocol Afrin (Oxymetazoline HCL) 0.05% nasal spray - 1 spray in both nostrils daily as needed prior to HBO treatment for difficulty clearing ears WOUND #3: - Foot Wound Laterality: Plantar, Left Cleanser: Soap and Water 1 x Per Day/ Discharge Instructions: May shower and wash wound with dial antibacterial soap and water prior to dressing change. Cleanser: Wound Cleanser 1 x Per Day/ Discharge Instructions: Cleanse the wound with wound cleanser prior to applying a clean dressing using gauze sponges, not tissue or cotton balls. Prim Dressing: Hydrofera Blue Ready Foam, 2.5 x2.5 in 1 x Per Day/ ary Discharge Instructions: Apply to wound bed as instructed Secondary Dressing: Woven Gauze Sponge, Non-Sterile 4x4 in 1 x Per Day/ Discharge Instructions: Apply over primary dressing as directed. Secondary Dressing: ABD Pad, 5x9 1 x Per Day/ Discharge Instructions: Apply over primary dressing as directed. Secured With: [employer], 4.5x3.1 (in/yd) 1 x Per Day/ Discharge Instructions: Secure with Kerlix as directed. Secured With: 75M Medipore H Soft Cloth Surgical T ape, 2x2 (in/yd) 1 x Per Day/ Discharge Instructions: Secure dressing with tape as directed. 1. The patient returns for the first obligatory total contact cast change which was done 2. We added some to fit over the [place] ABDs zinc oxide around the wound 3. We will be back to see me on Tuesday Electronic Signature(s) Signed: 07/17/2021 3:46:44 PM By: Baltazar Najjar MD Entered By: Baltazar Najjar on 07/17/2021 13:39:32 -------------------------------------------------------------------------------- Total Contact Cast Details Patient Name: Date of Service: [name], [name]  07/17/2021 12:30 PM Medical Record Number: [id number] Patient Account Number: [id number] Date of Birth/Sex: Treating RN: [birth year] (60 y.o. [name] Primary Care Provider: Cheryll [name] Other Clinician: Referring Provider: Treating Provider/Extender: [name] in Treatment: 6 T Contact Cast Applied for Wound Assessment: otal Wound #3 Left,Plantar Foot Performed By: Physician Maxwell Caul., MD Post Procedure Diagnosis Same as Pre-procedure Electronic Signature(s) Signed: 07/17/2021 3:46:44 PM By: Baltazar Najjar MD Entered By: Baltazar Najjar on 07/17/2021 13:36:52 -------------------------------------------------------------------------------- SuperBill Details Patient Name: Date of Service: [name], [name] 07/17/2021 Medical Record Number: [id number] Patient Account Number: [id number] Date of Birth/Sex: Treating RN: 1961-04-02 (60 y.o. [name] Primary Care Provider: Cheryll [name] Other Clinician: Referring Provider: Treating Provider/Extender: [name] in Treatment: 6 Diagnosis Coding ICD-10 Codes Code Description E11.621 Type 2 diabetes mellitus with foot ulcer M14.672 Charcot's joint, left ankle and foot L97.528 Non-pressure chronic ulcer of other part of left foot with other specified severity M86.372 Chronic multifocal osteomyelitis, left ankle and foot Facility Procedures CPT4 Code: [id number] Description: [id number] - APPLY TOTAL CONTACT LEG CAST ICD-10 Diagnosis Description L97.528 Non-pressure chronic ulcer of other part of left foot with other specified sever Modifier: ity Quantity: 1 Physician Procedures : CPT4 Code Description Modifier [id number] - WC PHYS APPLY TOTAL CONTACT CAST ICD-10 Diagnosis Description L97.528 Non-pressure chronic ulcer of other part of left foot with other specified severity Quantity: 1 Electronic Signature(s) Signed: 07/17/2021 3:46:44 PM By: Baltazar Najjar MD Entered  By: Baltazar Najjar on 07/17/2021 13:40:25

## 2021-07-17 NOTE — Progress Notes (Signed)
[name] ([id number]) Visit Report for 07/17/2021 Arrival Information Details Patient Name: Date of Service: [name] 07/17/2021 10:00 A M Medical Record Number: [id number] Patient Account Number: [id number] Date of Birth/Sex: Treating RN: [birth year] (60 y.o. [name] Primary Care Sterling Ucci: [name] Other Clinician: Haywood Pao Referring Jhase Creppel: Treating Saron Tweed/Extender: Avie Echevaria in Treatment: 6 Visit Information History Since Last Visit All ordered tests and consults were completed: Yes Patient Arrived: [name] Added or deleted any medications: No Arrival Time: 10:04 Any new allergies or adverse reactions: No Accompanied By: self Had a fall or experienced change in No Transfer Assistance: Manual activities of daily living that may affect Patient Identification Verified: Yes risk of falls: Secondary Verification Process Completed: Yes Signs or symptoms of abuse/neglect since last No Patient Requires Transmission-Based Precautions: No visito Patient Has Alerts: Yes Hospitalized since last visit: No Implantable device outside of the clinic No excluding cellular tissue based products placed in the center since last visit: Has Dressing in Place as Prescribed: Yes Has Footwear/Offloading in Place as Yes Prescribed: Left: Removable Cast Walker/Walking Boot T Contact Cast otal Pain Present Now: No Electronic Signature(s) Signed: 07/17/2021 1:23:07 PM By: Haywood Pao EMT Entered By: Haywood Pao on 07/17/2021 12:23:11 -------------------------------------------------------------------------------- Encounter Discharge Information Details Patient Name: Date of Service: [name] 07/17/2021 10:00 A M Medical Record Number: [id number] Patient Account Number: [id number] Date of Birth/Sex: Treating RN: Dec 06, 1960 (60 y.o. [name] Primary Care Charistopher Rumble: [name] Other Clinician: Haywood Pao Referring Kevionna Heffler: Treating Elisabella Hacker/Extender: Avie Echevaria in Treatment: 6 Encounter Discharge Information Items Discharge Condition: Stable Ambulatory Status: Walker Discharge Destination: Other (Note Required) Transportation: Other Accompanied By: self Schedule Follow-up Appointment: No Clinical Summary of Care: Notes Wound Care Encounter after Treatment today. Electronic Signature(s) Signed: 07/17/2021 1:23:07 PM By: Haywood Pao EMT Entered By: Haywood Pao on 07/17/2021 12:56:06 -------------------------------------------------------------------------------- Vitals Details Patient Name: Date of Service: [name] 07/17/2021 10:00 A M Medical Record Number: [id number] Patient Account Number: [id number] Date of Birth/Sex: Treating RN: 1960-10-28 (60 y.o. [name] Primary Care Lumi Winslett: [name] Other Clinician: Haywood Pao Referring Violeta Lecount: Treating Deaundra Kutzer/Extender: Avie Echevaria in Treatment: 6 Vital Signs Time Taken: 10:24 Temperature (F): 97.8 Height (in): 73 Pulse (bpm): 102 Weight (lbs): 270 Respiratory Rate (breaths/min): 20 Body Mass Index (BMI): 35.6 Blood Pressure (mmHg): 119/81 Capillary Blood Glucose (mg/dl): 671 Reference Range: 80 - 120 mg / dl Electronic Signature(s) Signed: 07/17/2021 1:23:07 PM By: Haywood Pao EMT Entered By: Haywood Pao on 07/17/2021 12:29:34

## 2021-07-20 ENCOUNTER — Other Ambulatory Visit: Payer: Self-pay

## 2021-07-20 ENCOUNTER — Encounter (HOSPITAL_BASED_OUTPATIENT_CLINIC_OR_DEPARTMENT_OTHER): Payer: Medicare Other | Attending: Internal Medicine | Admitting: Internal Medicine

## 2021-07-20 DIAGNOSIS — E11621 Type 2 diabetes mellitus with foot ulcer: Secondary | ICD-10-CM | POA: Insufficient documentation

## 2021-07-20 DIAGNOSIS — M86372 Chronic multifocal osteomyelitis, left ankle and foot: Secondary | ICD-10-CM | POA: Insufficient documentation

## 2021-07-20 DIAGNOSIS — M14672 Charcot's joint, left ankle and foot: Secondary | ICD-10-CM | POA: Diagnosis not present

## 2021-07-20 DIAGNOSIS — L97528 Non-pressure chronic ulcer of other part of left foot with other specified severity: Secondary | ICD-10-CM | POA: Diagnosis not present

## 2021-07-20 DIAGNOSIS — E11610 Type 2 diabetes mellitus with diabetic neuropathic arthropathy: Secondary | ICD-10-CM | POA: Diagnosis not present

## 2021-07-20 LAB — GLUCOSE, CAPILLARY
Glucose-Capillary: 167 mg/dL — ABNORMAL HIGH (ref 70–99)
Glucose-Capillary: 143 mg/dL — ABNORMAL HIGH (ref 70–99)

## 2021-07-20 NOTE — Progress Notes (Signed)
[name], [name] ([id number]) Visit Report for 07/20/2021 Arrival Information Details Patient Name: Date of Service: [name], [name] 07/20/2021 10:00 A M Medical Record Number: [id number] Patient Account Number: [id number] Date of Birth/Sex: Treating RN: 1961-04-23 (60 y.o. [name] Primary Care Amybeth Sieg: [name] Other Clinician: Haywood Pao Referring Faydra Korman: Treating Kenson Groh/Extender: [name] in Treatment: 6 Visit Information History Since Last Visit All ordered tests and consults were completed: Yes Patient Arrived: [name] Added or deleted any medications: No Arrival Time: 10:07 Any new allergies or adverse reactions: No Accompanied By: self Had a fall or experienced change in No Transfer Assistance: Manual activities of daily living that may affect Patient Identification Verified: Yes risk of falls: Secondary Verification Process Completed: Yes Signs or symptoms of abuse/neglect since last No Patient Requires Transmission-Based Precautions: No visito Patient Has Alerts: Yes Hospitalized since last visit: No Implantable device outside of the clinic No excluding cellular tissue based products placed in the center since last visit: Has Dressing in Place as Prescribed: Yes Has Footwear/Offloading in Place as Yes Prescribed: Left: Removable Cast Walker/Walking Boot T Contact Cast otal Pain Present Now: No Electronic Signature(s) Signed: 07/20/2021 6:31:52 PM By: Haywood Pao EMT Entered By: Haywood Pao on 07/20/2021 12:30:58 -------------------------------------------------------------------------------- Encounter Discharge Information Details Patient Name: Date of Service: [name] 07/20/2021 10:00 A M Medical Record Number: [id number] Patient Account Number: [id number] Date of Birth/Sex: Treating RN: 04-Jan-1961 (60 y.o. [name] Primary Care Dartanion Teo: [name] Other Clinician: Haywood Pao Referring Teana Lindahl: Treating Teauna Dubach/Extender: [name] in Treatment: 6 Encounter Discharge Information Items Discharge Condition: Stable Ambulatory Status: Walker Discharge Destination: Home Transportation: Private Auto Accompanied By: self Schedule Follow-up Appointment: No Clinical Summary of Care: Electronic Signature(s) Signed: 07/20/2021 6:31:52 PM By: Haywood Pao EMT Signed: 07/20/2021 6:31:52 PM By: Haywood Pao EMT Entered By: Haywood Pao on 07/20/2021 12:37:58 -------------------------------------------------------------------------------- Vitals Details Patient Name: Date of Service: [name] 07/20/2021 10:00 A M Medical Record Number: [id number] Patient Account Number: [id number] Date of Birth/Sex: Treating RN: Mar 11, 1961 (60 y.o. [name] Primary Care Lavon Horn: [name] Other Clinician: Haywood Pao Referring Amberlynn Tempesta: Treating Trigger Frasier/Extender: [name] in Treatment: 6 Vital Signs Time Taken: 10:11 Temperature (F): 98.7 Height (in): 73 Pulse (bpm): 102 Weight (lbs): 270 Respiratory Rate (breaths/min): 20 Body Mass Index (BMI): 35.6 Blood Pressure (mmHg): 137/86 Capillary Blood Glucose (mg/dl): 924 Reference Range: 80 - 120 mg / dl Electronic Signature(s) Signed: 07/20/2021 6:31:52 PM By: Haywood Pao EMT Entered By: Haywood Pao on 07/20/2021 12:31:33

## 2021-07-21 ENCOUNTER — Encounter (HOSPITAL_BASED_OUTPATIENT_CLINIC_OR_DEPARTMENT_OTHER): Payer: Medicare Other | Admitting: Internal Medicine

## 2021-07-21 DIAGNOSIS — E11621 Type 2 diabetes mellitus with foot ulcer: Secondary | ICD-10-CM | POA: Diagnosis not present

## 2021-07-21 LAB — GLUCOSE, CAPILLARY: Glucose-Capillary: 152 mg/dL — ABNORMAL HIGH (ref 70–99)

## 2021-07-21 NOTE — Progress Notes (Signed)
JAYEL, INKS ([id number]) Visit Report for 07/20/2021 HBO Details Patient Name: Date of Service: [name], [name] 07/20/2021 10:00 A M Medical Record Number: [id number] Patient Account Number: [id number] Date of Birth/Sex: Treating RN: [birth year] (60 y.o. [name] Primary Care Shiela Bruns: [name] Other Clinician: Haywood Pao Referring Akshaj Besancon: Treating Semira Stoltzfus/Extender: [name] in Treatment: 6 HBO Treatment Course Details Treatment Course Number: 1 Ordering Francely Craw: Linard Millers Treatments Ordered: otal 40 HBO Treatment Start Date: 07/07/2021 HBO Indication: Chronic Refractory Osteomyelitis to Left,Plantar Foot HBO Treatment Details Treatment Number: 8 Patient Type: Outpatient Chamber Type: Monoplace Chamber Serial #: [id number] Treatment Protocol: 2.5 ATA with 90 minutes oxygen, with two 5 minute air breaks Treatment Details Compression Rate Down: 2.5 psi / minute De-Compression Rate Up: 2.5 psi / minute A breaks and breathing ir Compress Tx Pressure periods Decompress Decompress Begins Reached (leave unused spaces Begins Ends blank) Chamber Pressure (ATA 1 2.5 2.5 2.5 2.5 2.5 - - 2.5 1 ) Clock Time (24 hr) 10:30 10:41 11:11 11:16 11:46 11:51 - - 11:46 11:57 Treatment Length: 87 (minutes) Treatment Segments: 3 Vital Signs Capillary Blood Glucose Reference Range: 80 - 120 mg / dl HBO Diabetic Blood Glucose Intervention Range: <131 mg/dl or >008 mg/dl Time Vitals Blood Respiratory Capillary Blood Glucose Pulse Action Type: Pulse: Temperature: Taken: Pressure: Rate: Glucose (mg/dl): Meter #: Oximetry (%) Taken: Pre 10:11 137/86 102 20 98.7 167 Post 11:57 144/81 91 20 97.8 143 Treatment Response Treatment Toleration: Well Treatment Completion Status: Treatment Completed without Adverse Event Treatment Notes Power outage experienced today. Per protocol, treatment was permitted to continue. Patient had an appointment after  treatment and requested to be removed from chamber 30 minutes early. Physician HBO Attestation: I certify that I supervised this HBO treatment in accordance with Medicare guidelines. A trained emergency response team is readily available per Yes hospital policies and procedures. Continue HBOT as ordered. Yes Electronic Signature(s) Signed: 07/21/2021 1:22:13 PM By: [name] DO Previous Signature: 07/20/2021 6:31:52 PM Version By: Haywood Pao EMT Entered By: [name] on 07/21/2021 13:21:02 -------------------------------------------------------------------------------- HBO Safety Checklist Details Patient Name: Date of Service: [name], [name] IG 07/20/2021 10:00 A M Medical Record Number: [id number] Patient Account Number: [id number] Date of Birth/Sex: Treating RN: 24-Jan-1961 (60 y.o. [name] Primary Care Michail Boyte: [name] Other Clinician: Haywood Pao Referring Latoria Dry: Treating Malyn Aytes/Extender: [name] in Treatment: 6 HBO Safety Checklist Items Safety Checklist Consent Form Signed Patient voided / foley secured and emptied When did you last eato 0900 Last dose of injectable or oral agent n/a Ostomy pouch emptied and vented if applicable NA All implantable devices assessed, documented and approved NA Intravenous access site secured and place NA Valuables secured Linens and cotton and cotton/polyester blend (less than 51% polyester) Personal oil-based products / skin lotions / body lotions removed Wigs or hairpieces removed NA Smoking or tobacco materials removed NA Books / newspapers / magazines / loose paper removed Cologne, aftershave, perfume and deodorant removed Jewelry removed (may wrap wedding band) Make-up removed NA Hair care products removed Battery operated devices (external) removed Heating patches and chemical warmers removed Titanium eyewear removed NA Nail polish cured greater than  10 hours NA Casting material cured greater than 10 hours Cast applied at last Wound Care Encounter Hearing aids removed Loose dentures or partials removed NA Prosthetics have been removed NA Patient demonstrates correct use of air break device (if applicable) Patient concerns have been addressed Patient grounding bracelet on and cord  attached to chamber Specifics for Inpatients (complete in addition to above) Medication sheet sent with patient NA Intravenous medications needed or due during therapy sent with patient NA Drainage tubes (e.g. nasogastric tube or chest tube secured and vented) NA Endotracheal or Tracheotomy tube secured NA Cuff deflated of air and inflated with saline NA Airway suctioned NA Notes Paper version used prior to treatment. Data entered afterward. Electronic Signature(s) Signed: 07/20/2021 6:31:52 PM By: Haywood Pao EMT Entered By: Haywood Pao on 07/20/2021 18:29:59

## 2021-07-21 NOTE — Progress Notes (Signed)
[name], [name] ([id number]) Visit Report for 07/20/2021 SuperBill Details Patient Name: Date of Service: [name], [name] 07/20/2021 Medical Record Number: [id number] Patient Account Number: [id number] Date of Birth/Sex: Treating RN: 08-31-1961 (60 y.o. [name] Primary Care Provider: [name] Other Clinician: Haywood Pao Referring Provider: Treating Provider/Extender: [name] in Treatment: 6 Diagnosis Coding ICD-10 Codes Code Description [phone] Type 2 diabetes mellitus with foot ulcer M14.672 Charcot's joint, left ankle and foot L97.528 Non-pressure chronic ulcer of other part of left foot with other specified severity M86.372 Chronic multifocal osteomyelitis, left ankle and foot Facility Procedures CPT4 Code Description Modifier Quantity [id number]-(Facility Use Only) HBOT full body chamber, , 3 ICD-10 Diagnosis Description E11.621 Type 2 diabetes mellitus with foot ulcer M14.672 Charcot's joint, left ankle and foot L97.528 Non-pressure chronic ulcer of other part of left foot with other specified severity M86.372 Chronic multifocal osteomyelitis, left ankle and foot Physician Procedures Quantity CPT4 Code Description Modifier [id number] - WC PHYS HYPERBARIC OXYGEN THERAPY 1 ICD-10 Diagnosis Description E11.621 Type 2 diabetes mellitus with foot ulcer M14.672 Charcot's joint, left ankle and foot L97.528 Non-pressure chronic ulcer of other part of left foot with other specified severity M86.372 Chronic multifocal osteomyelitis, left ankle and foot Electronic Signature(s) Signed: 07/20/2021 6:31:52 PM By: Haywood Pao EMT Signed: 07/21/2021 1:22:13 PM By: [name] DO Entered By: Haywood Pao on 07/20/2021 12:40:42

## 2021-07-21 NOTE — Progress Notes (Signed)
MACAULAY, REICHER ([id number]) Visit Report for 07/21/2021 Arrival Information Details Patient Name: Date of Service: [name], [name] 07/21/2021 10:00 A M Medical Record Number: [id number] Patient Account Number: [id number] Date of Birth/Sex: Treating RN: 03-07-1961 (60 y.o. [name], Lauren Primary Care Lillieanna Tuohy: [name] Other Clinician: Haywood Pao Referring Ryatt Corsino: Treating Jacquel Redditt/Extender: Avie Echevaria in Treatment: 6 Visit Information History Since Last Visit All ordered tests and consults were completed: Yes Patient Arrived: [name] Added or deleted any medications: No Arrival Time: 10:07 Any new allergies or adverse reactions: No Accompanied By: self Had a fall or experienced change in No Transfer Assistance: None activities of daily living that may affect Patient Identification Verified: Yes risk of falls: Secondary Verification Process Completed: Yes Signs or symptoms of abuse/neglect since last visito No Patient Requires Transmission-Based Precautions: No Hospitalized since last visit: No Patient Has Alerts: Yes Implantable device outside of the clinic excluding No cellular tissue based products placed in the center since last visit: Pain Present Now: No Electronic Signature(s) Signed: 07/21/2021 4:26:10 PM By: Haywood Pao EMT Entered By: Haywood Pao on 07/21/2021 10:46:52 -------------------------------------------------------------------------------- Encounter Discharge Information Details Patient Name: Date of Service: [name] 07/21/2021 10:00 A M Medical Record Number: [id number] Patient Account Number: [id number] Date of Birth/Sex: Treating RN: 1961-10-18 (60 y.o. [name], Lauren Primary Care Michaella Imai: [name] Other Clinician: Haywood Pao Referring Latyra Jaye: Treating Alcee Sipos/Extender: Avie Echevaria in Treatment: 6 Encounter Discharge Information Items Discharge  Condition: Stable Ambulatory Status: Walker Discharge Destination: Other (Note Required) Transportation: Other Accompanied By: self Schedule Follow-up Appointment: No Clinical Summary of Care: Electronic Signature(s) Signed: 07/21/2021 5:01:59 PM By: Haywood Pao EMT Entered By: Haywood Pao on 07/21/2021 16:34:30 -------------------------------------------------------------------------------- Vitals Details Patient Name: Date of Service: [name] 07/21/2021 10:00 A M Medical Record Number: [id number] Patient Account Number: [id number] Date of Birth/Sex: Treating RN: Aug 19, 1961 (60 y.o. [name], Lauren Primary Care Samayah Novinger: [name] Other Clinician: Haywood Pao Referring Griselle Rufer: Treating Greggory Safranek/Extender: Avie Echevaria in Treatment: 6 Vital Signs Time Taken: 01:05 Temperature (F): 97.7 Height (in): 73 Pulse (bpm): 102 Weight (lbs): 270 Respiratory Rate (breaths/min): 18 Body Mass Index (BMI): 35.6 Blood Pressure (mmHg): 138/78 Capillary Blood Glucose (mg/dl): 623 Reference Range: 80 - 120 mg / dl Electronic Signature(s) Signed: 07/21/2021 4:26:10 PM By: Haywood Pao EMT Entered By: Haywood Pao on 07/21/2021 10:49:11

## 2021-07-22 ENCOUNTER — Encounter (HOSPITAL_BASED_OUTPATIENT_CLINIC_OR_DEPARTMENT_OTHER): Payer: Medicare Other | Admitting: Physician Assistant

## 2021-07-22 ENCOUNTER — Other Ambulatory Visit: Payer: Self-pay

## 2021-07-22 DIAGNOSIS — E11621 Type 2 diabetes mellitus with foot ulcer: Secondary | ICD-10-CM | POA: Diagnosis not present

## 2021-07-22 LAB — GLUCOSE, CAPILLARY
Glucose-Capillary: 140 mg/dL — ABNORMAL HIGH (ref 70–99)
Glucose-Capillary: 187 mg/dL — ABNORMAL HIGH (ref 70–99)
Glucose-Capillary: 156 mg/dL — ABNORMAL HIGH (ref 70–99)

## 2021-07-22 NOTE — Progress Notes (Signed)
JRU, PENSE ([id number]) Visit Report for 07/21/2021 Arrival Information Details Patient Name: Date of Service: WINFIELD, CABA 07/21/2021 12:30 PM Medical Record Number: [id number] Patient Account Number: [id number] Date of Birth/Sex: Treating RN: Jan 19, 1961 (60 y.o. [name] Primary Care Provider: Billey Gosling Other Clinician: Referring Provider: Treating Provider/Extender: Genella Rife in Treatment: 6 Visit Information History Since Last Visit Added or deleted any medications: No Patient Arrived: Ambulatory Any new allergies or adverse reactions: No Arrival Time: 13:06 Had a fall or experienced change in No Accompanied By: self activities of daily living that may affect Transfer Assistance: None risk of falls: Patient Identification Verified: Yes Signs or symptoms of abuse/neglect since last visito No Secondary Verification Process Completed: Yes Hospitalized since last visit: No Patient Requires Transmission-Based Precautions: No Implantable device outside of the clinic excluding No Patient Has Alerts: Yes cellular tissue based products placed in the center since last visit: Has Dressing in Place as Prescribed: Yes Has Footwear/Offloading in Place as Prescribed: Yes Left: T Contact Cast otal Pain Present Now: No Electronic Signature(s) Signed: 07/21/2021 5:33:33 PM By: [name] RN, BSN Entered By: [name] on 07/21/2021 13:09:53 -------------------------------------------------------------------------------- Encounter Discharge Information Details Patient Name: Date of Service: [name] 07/21/2021 12:30 PM Medical Record Number: [id number] Patient Account Number: [id number] Date of Birth/Sex: Treating RN: [birth year] (60 y.o. [name] Primary Care Provider: Billey Gosling Other Clinician: Referring Provider: Treating Provider/Extender: Genella Rife in Treatment: 6 Encounter Discharge  Information Items Post Procedure Vitals Discharge Condition: Stable Temperature (F): 97.7 Ambulatory Status: Walker Pulse (bpm): 102 Discharge Destination: Home Respiratory Rate (breaths/min): 18 Transportation: Private Auto Blood Pressure (mmHg): 138/78 Accompanied By: alone Schedule Follow-up Appointment: Yes Clinical Summary of Care: Patient Declined Electronic Signature(s) Signed: 07/21/2021 5:33:33 PM By: [name] RN, BSN Entered By: [name] on 07/21/2021 16:49:16 -------------------------------------------------------------------------------- Lower Extremity Assessment Details Patient Name: Date of Service: CHAYSON, CHARTERS IG 07/21/2021 12:30 PM Medical Record Number: [id number] Patient Account Number: [id number] Date of Birth/Sex: Treating RN: 22-Dec-1960 (60 y.o. [name] Primary Care Provider: Billey Gosling Other Clinician: Referring Provider: Treating Provider/Extender: Genella Rife in Treatment: 6 Edema Assessment Assessed: Shirlyn Goltz: Yes] [Right: No] Edema: [Left: Ye] [Right: s] Calf Left: Right: Point of Measurement: 36 cm From Medial Instep 46.5 cm Ankle Left: Right: Point of Measurement: 13 cm From Medial Instep 28 cm Vascular Assessment Pulses: Dorsalis Pedis Palpable: [Left:Yes] Posterior Tibial Palpable: [Left:Yes] Electronic Signature(s) Signed: 07/21/2021 5:33:33 PM By: [name] RN, BSN Entered By: [name] on 07/21/2021 13:16:28 -------------------------------------------------------------------------------- Multi Wound Chart Details Patient Name: Date of Service: [name] 07/21/2021 12:30 PM Medical Record Number: [id number] Patient Account Number: [id number] Date of Birth/Sex: Treating RN: Oct 26, 1960 (60 y.o. [name], Lauren Primary Care Provider: Billey Gosling Other Clinician: Referring Provider: Treating Provider/Extender: Genella Rife in Treatment: 6 Vital  Signs Height(in): 73 Capillary Blood Glucose(mg/dl): 152 Weight(lbs): 270 Pulse(bpm): 102 Body Mass Index(BMI): 36 Blood Pressure(mmHg): 138/78 Temperature(F): 97.7 Respiratory Rate(breaths/min): 18 Photos: [3:No Photos Left, Plantar Foot] [N/A:N/A N/A] Wound Location: [3:Gradually Appeared] [N/A:N/A] Wounding Event: [3:Diabetic Wound/Ulcer of the Lower] [N/A:N/A] Primary Etiology: [3:Extremity Anemia, Sleep Apnea, Congestive] [N/A:N/A] Comorbid History: [3:Heart Failure, Hypertension, Peripheral Venous Disease, Cirrhosis , Type II Diabetes, Osteoarthritis, Neuropathy 03/18/2021] [N/A:N/A] Date Acquired: [3:6] [N/A:N/A] Weeks of Treatment: [3:Open] [N/A:N/A] Wound Status: [3:1.5x2.5x0.2] [N/A:N/A] Measurements L x W x D (cm) [3:2.945] [N/A:N/A] A (cm) : rea [3:0.589] [N/A:N/A] Volume (cm) : [3:58.30%] [N/A:N/A] % Reduction  in A [3:rea: 16.70%] [N/A:N/A] % Reduction in Volume: [3:Grade 3] [N/A:N/A] Classification: [3:Medium] [N/A:N/A] Exudate A mount: [3:Serosanguineous] [N/A:N/A] Exudate Type: [3:red, brown] [N/A:N/A] Exudate Color: [3:Distinct, outline attached] [N/A:N/A] Wound Margin: [3:Large (67-100%)] [N/A:N/A] Granulation A mount: [3:Red, Pink] [N/A:N/A] Granulation Quality: [3:Small (1-33%)] [N/A:N/A] Necrotic A mount: [3:Fascia: No] [N/A:N/A] Exposed Structures: [3:Fat Layer (Subcutaneous Tissue): No Tendon: No Muscle: No Joint: No Bone: No None] [N/A:N/A] Epithelialization: [3:Debridement - Excisional] [N/A:N/A] Debridement: Pre-procedure Verification/Time Out 13:27 [N/A:N/A] Taken: [3:Callus, Subcutaneous, Slough] [N/A:N/A] Tissue Debrided: [3:Skin/Subcutaneous Tissue] [N/A:N/A] Level: [3:3.75] [N/A:N/A] Debridement A (sq cm): [3:rea Curette] [N/A:N/A] Instrument: [3:Moderate] [N/A:N/A] Bleeding: [3:Silver Nitrate] [N/A:N/A] Hemostasis A chieved: [3:0] [N/A:N/A] Procedural Pain: [3:0] [N/A:N/A] Post Procedural Pain: [3:Procedure was tolerated well]  [N/A:N/A] Debridement Treatment Response: [3:1.5x2.5x0.2] [N/A:N/A] Post Debridement Measurements L x W x D (cm) [3:0.589] [N/A:N/A] Post Debridement Volume: (cm) [3:Debridement] [N/A:N/A] Procedures Performed: [3:T Contact Cast otal] Treatment Notes Electronic Signature(s) Signed: 07/21/2021 5:26:57 PM By: Rhae Hammock RN Signed: 07/22/2021 10:14:24 AM By: Linton Ham MD Entered By: Linton Ham on 07/21/2021 13:33:35 -------------------------------------------------------------------------------- Multi-Disciplinary Care Plan Details Patient Name: Date of Service: XAVIER, MUNGER IG 07/21/2021 12:30 PM Medical Record Number: [id number] Patient Account Number: [id number] Date of Birth/Sex: Treating RN: [birth year] (60 y.o. [name] Primary Care Provider: Billey Gosling Other Clinician: Referring Provider: Treating Provider/Extender: Genella Rife in Treatment: 6 Multidisciplinary Care Plan reviewed with physician Active Inactive HBO Nursing Diagnoses: Anxiety related to feelings of confinement associated with the hyperbaric oxygen chamber Anxiety related to knowledge deficit of hyperbaric oxygen therapy and treatment procedures Discomfort related to temperature and humidity changes inside hyperbaric chamber Potential for barotraumas to ears, sinuses, teeth, and lungs or cerebral gas embolism related to changes in atmospheric pressure inside hyperbaric oxygen chamber Potential for oxygen toxicity seizures related to delivery of 100% oxygen at an increased atmospheric pressure Potential for pulmonary oxygen toxicity related to delivery of 100% oxygen at an increased atmospheric pressure Goals: Barotrauma will be prevented during HBO2 Date Initiated: 06/04/2021 T arget Resolution Date: 08/14/2021 Goal Status: Active Patient and/or family will be able to state/discuss factors appropriate to the management of their disease process during  treatment Date Initiated: 06/04/2021 T arget Resolution Date: 08/14/2021 Goal Status: Active Patient will tolerate the hyperbaric oxygen therapy treatment Date Initiated: 06/04/2021 T arget Resolution Date: 08/14/2021 Goal Status: Active Patient will tolerate the internal climate of the chamber Date Initiated: 06/04/2021 T arget Resolution Date: 08/14/2021 Goal Status: Active Patient/caregiver will verbalize understanding of HBO goals, rationale, procedures and potential hazards Date Initiated: 06/04/2021 T arget Resolution Date: 08/14/2021 Goal Status: Active Signs and symptoms of pulmonary oxygen toxicity will be recognized and promptly addressed Date Initiated: 06/04/2021 T arget Resolution Date: 08/14/2021 Goal Status: Active Signs and symptoms of seizure will be recognized and promptly addressed ; seizing patients will suffer no harm Date Initiated: 06/04/2021 T arget Resolution Date: 08/14/2021 Goal Status: Active Interventions: Administer a five (5) minute air break for patient if signs and symptoms of seizure appear and notify the hyperbaric physician Administer decongestants, per physician orders, prior to HBO2 Administer the correct therapeutic gas delivery based on the patients needs and limitations, per physician order Assess and provide for patients comfort related to the hyperbaric environment and equalization of middle ear Assess for signs and symptoms related to adverse events, including but not limited to confinement anxiety, pneumothorax, oxygen toxicity and baurotrauma Assess patient for any history of confinement anxiety Assess patient's knowledge and expectations regarding hyperbaric medicine and provide  education related to the hyperbaric environment, goals of treatment and prevention of adverse events Implement protocols to decrease risk of pneumothorax in high risk patients Notes: Nutrition Nursing Diagnoses: Impaired glucose control: actual or  potential Potential for alteratiion in Nutrition/Potential for imbalanced nutrition Goals: Patient/caregiver agrees to and verbalizes understanding of need to use nutritional supplements and/or vitamins as prescribed Date Initiated: 06/04/2021 Date Inactivated: 07/07/2021 Target Resolution Date: 07/04/2021 Goal Status: Met Patient/caregiver will maintain therapeutic glucose control Date Initiated: 06/04/2021 Target Resolution Date: 08/04/2021 Goal Status: Active Interventions: Assess HgA1c results as ordered upon admission and as needed Assess patient nutrition upon admission and as needed per policy Provide education on elevated blood sugars and impact on wound healing Notes: 07/07/21: Glucose control ongoing Wound/Skin Impairment Nursing Diagnoses: Impaired tissue integrity Knowledge deficit related to ulceration/compromised skin integrity Goals: Patient/caregiver will verbalize understanding of skin care regimen Date Initiated: 06/04/2021 Target Resolution Date: 08/04/2021 Goal Status: Active Interventions: Assess patient/caregiver ability to obtain necessary supplies Assess patient/caregiver ability to perform ulcer/skin care regimen upon admission and as needed Assess ulceration(s) every visit Provide education on ulcer and skin care Notes: Electronic Signature(s) Signed: 07/21/2021 5:33:33 PM By: [name] RN, BSN Entered By: [name] on 07/21/2021 13:25:30 -------------------------------------------------------------------------------- Pain Assessment Details Patient Name: Date of Service: MARSHAL, ESKEW IG 07/21/2021 12:30 PM Medical Record Number: [id number] Patient Account Number: [id number] Date of Birth/Sex: Treating RN: [birth year] (60 y.o. [name] Primary Care Provider: Billey Gosling Other Clinician: Referring Provider: Treating Provider/Extender: Genella Rife in Treatment: 6 Active Problems Location of Pain Severity and  Description of Pain Patient Has Paino No Site Locations Pain Management and Medication Current Pain Management: Electronic Signature(s) Signed: 07/21/2021 5:33:33 PM By: [name] RN, BSN Entered By: [name] on 07/21/2021 13:13:07 -------------------------------------------------------------------------------- Patient/Caregiver Education Details Patient Name: Date of Service: Emelia Salisbury 10/4/2022andnbsp12:30 PM Medical Record Number: [id number] Patient Account Number: [id number] Date of Birth/Gender: Treating RN: Mar 03, 1961 (60 y.o. [name] Primary Care Physician: Billey Gosling Other Clinician: Referring Physician: Treating Physician/Extender: Genella Rife in Treatment: 6 Education Assessment Education Provided To: Patient Education Topics Provided Wound/Skin Impairment: Methods: Explain/Verbal Responses: State content correctly Electronic Signature(s) Signed: 07/21/2021 5:33:33 PM By: [name] RN, BSN Entered By: [name] on 07/21/2021 13:53:43 -------------------------------------------------------------------------------- Wound Assessment Details Patient Name: Date of Service: PRATHIK, AMAN IG 07/21/2021 12:30 PM Medical Record Number: [id number] Patient Account Number: [id number] Date of Birth/Sex: Treating RN: December 27, 1960 (60 y.o. [name] Primary Care Provider: Billey Gosling Other Clinician: Referring Provider: Treating Provider/Extender: Genella Rife in Treatment: 6 Wound Status Wound Number: 3 Primary Diabetic Wound/Ulcer of the Lower Extremity Etiology: Wound Location: Left, Plantar Foot Wound Open Wounding Event: Gradually Appeared Status: Date Acquired: 03/18/2021 Comorbid Anemia, Sleep Apnea, Congestive Heart Failure, Hypertension, Weeks Of Treatment: 6 History: Peripheral Venous Disease, Cirrhosis , Type II Diabetes, Clustered Wound: No Osteoarthritis, Neuropathy Wound  Measurements Length: (cm) 1.5 Width: (cm) 2.5 Depth: (cm) 0.2 Area: (cm) 2.945 Volume: (cm) 0.589 % Reduction in Area: 58.3% % Reduction in Volume: 16.7% Epithelialization: None Tunneling: No Undermining: No Wound Description Classification: Grade 3 Wound Margin: Distinct, outline attached Exudate Amount: Medium Exudate Type: Serosanguineous Exudate Color: red, brown Foul Odor After Cleansing: No Slough/Fibrino Yes Wound Bed Granulation Amount: Large (67-100%) Exposed Structure Granulation Quality: Red, Pink Fascia Exposed: No Necrotic Amount: Small (1-33%) Fat Layer (Subcutaneous Tissue) Exposed: No Necrotic Quality: Adherent Slough Tendon Exposed: No Muscle Exposed: No Joint Exposed: No Bone  Exposed: No Treatment Notes Wound #3 (Foot) Wound Laterality: Plantar, Left Cleanser Soap and Water Discharge Instruction: May shower and wash wound with dial antibacterial soap and water prior to dressing change. Wound Cleanser Discharge Instruction: Cleanse the wound with wound cleanser prior to applying a clean dressing using gauze sponges, not tissue or cotton balls. Peri-Wound Care Zinc Oxide Ointment 30g tube Discharge Instruction: Apply Zinc Oxide to periwound with each dressing change Topical Primary Dressing Hydrofera Blue Ready Foam, 2.5 x2.5 in Discharge Instruction: Apply to wound bed as instructed Secondary Dressing ABD Pad, 5x9 Discharge Instruction: Apply over primary dressing as directed. Zetuvit Plus 4x8 in Discharge Instruction: Apply over primary dressing as directed. Secured With [employer], 4.5x3.1 (in/yd) Discharge Instruction: Secure with Kerlix as directed. 37M Medipore H Soft Cloth Surgical T ape, 2x2 (in/yd) Discharge Instruction: Secure dressing with tape as directed. Compression Wrap Compression Stockings Add-Ons Electronic Signature(s) Signed: 07/21/2021 5:33:33 PM By: [name] RN, BSN Entered By: [name] on 07/21/2021  13:17:20 -------------------------------------------------------------------------------- Vitals Details Patient Name: Date of Service: PAVEL, GADD IG 07/21/2021 12:30 PM Medical Record Number: [id number] Patient Account Number: [id number] Date of Birth/Sex: Treating RN: 01/01/1961 (60 y.o. [name] Primary Care Evelyna Folker: Billey Gosling Other Clinician: Referring Tipton Ballow: Treating Laylani Pudwill/Extender: Genella Rife in Treatment: 6 Vital Signs Time Taken: 13:12 Temperature (F): 97.7 Height (in): 73 Pulse (bpm): 102 Weight (lbs): 270 Respiratory Rate (breaths/min): 18 Body Mass Index (BMI): 35.6 Blood Pressure (mmHg): 138/78 Capillary Blood Glucose (mg/dl): 152 Reference Range: 80 - 120 mg / dl Electronic Signature(s) Signed: 07/21/2021 5:33:33 PM By: [name] RN, BSN Entered By: [name] on 07/21/2021 13:13:01

## 2021-07-22 NOTE — Progress Notes (Signed)
ELDRA, WORD ([id number]) Visit Report for 07/21/2021 SuperBill Details Patient Name: Date of Service: [name], [name] 07/21/2021 Medical Record Number: [id number] Patient Account Number: [id number] Date of Birth/Sex: Treating RN: 29-Oct-1960 (60 y.o. Charlean Merl, Lauren Primary Care Provider: [name] Other Clinician: Haywood Pao Referring Provider: Treating Provider/Extender: Avie Echevaria in Treatment: 6 Diagnosis Coding ICD-10 Codes Code Description [phone] Type 2 diabetes mellitus with foot ulcer M14.672 Charcot's joint, left ankle and foot L97.528 Non-pressure chronic ulcer of other part of left foot with other specified severity M86.372 Chronic multifocal osteomyelitis, left ankle and foot Facility Procedures CPT4 Code Description Modifier Quantity [id number] G0277-(Facility Use Only) HBOT full body chamber, , 4 ICD-10 Diagnosis Description E11.621 Type 2 diabetes mellitus with foot ulcer M14.672 Charcot's joint, left ankle and foot L97.528 Non-pressure chronic ulcer of other part of left foot with other specified severity M86.372 Chronic multifocal osteomyelitis, left ankle and foot Physician Procedures Quantity CPT4 Code Description Modifier [id number] - WC PHYS HYPERBARIC OXYGEN THERAPY 1 ICD-10 Diagnosis Description E11.621 Type 2 diabetes mellitus with foot ulcer M14.672 Charcot's joint, left ankle and foot L97.528 Non-pressure chronic ulcer of other part of left foot with other specified severity M86.372 Chronic multifocal osteomyelitis, left ankle and foot Electronic Signature(s) Signed: 07/21/2021 5:01:59 PM By: Haywood Pao EMT Signed: 07/22/2021 10:14:24 AM By: Baltazar Najjar MD Entered By: Haywood Pao on 07/21/2021 16:34:00

## 2021-07-22 NOTE — Progress Notes (Signed)
[name], [name] ([id number]) Visit Report for 07/21/2021 HBO Details Patient Name: Date of Service: [name], PERFECT 07/21/2021 10:00 A M Medical Record Number: [id number] Patient Account Number: [id number] Date of Birth/Sex: Treating RN: [birth year] (60 y.o. [name], Lauren Primary Care Brynlie Daza: [name] Other Clinician: Haywood Pao Referring Chesky Heyer: Treating Aadith Raudenbush/Extender: Avie Echevaria in Treatment: 6 HBO Treatment Course Details Treatment Course Number: 1 Ordering Rey Dansby: Baltazar Najjar T Treatments Ordered: otal 40 HBO Treatment Start Date: 07/07/2021 HBO Indication: Chronic Refractory Osteomyelitis to Left,Plantar Foot HBO Treatment Details Treatment Number: 9 Patient Type: Outpatient Chamber Type: Monoplace Chamber Serial #: [id number] Treatment Protocol: 2.5 ATA with 90 minutes oxygen, with two 5 minute air breaks Treatment Details Compression Rate Down: 2.0 psi / minute De-Compression Rate Up: 2.5 psi / minute A breaks and breathing ir Compress Tx Pressure periods Decompress Decompress Begins Reached (leave unused spaces Begins Ends blank) Chamber Pressure (ATA 1 2.5 2.5 2.5 2.5 2.5 - - 2.5 1 ) Clock Time (24 hr) 10:39 10:50 11:20 11:25 11:55 12:00 - - 12:30 12:40 Treatment Length: 121 (minutes) Treatment Segments: 4 Vital Signs Capillary Blood Glucose Reference Range: 80 - 120 mg / dl HBO Diabetic Blood Glucose Intervention Range: <131 mg/dl or >242 mg/dl Time Vitals Blood Respiratory Capillary Blood Glucose Pulse Action Type: Pulse: Temperature: Taken: Pressure: Rate: Glucose (mg/dl): Meter #: Oximetry (%) Taken: Pre 01:05 138/78 102 18 97.7 152 2 Post 12:46 133/73 93 18 97.7 140 2 Treatment Response Treatment Toleration: Well Treatment Completion Status: Treatment Completed without Adverse Event Jaxn Chiquito Notes No concerns with treatment given. Patient was also seen for wound care evaluation Physician HBO  Attestation: I certify that I supervised this HBO treatment in accordance with Medicare guidelines. A trained emergency response team is readily available per Yes hospital policies and procedures. Continue HBOT as ordered. Yes Electronic Signature(s) Signed: 07/22/2021 10:14:24 AM By: Baltazar Najjar MD Previous Signature: 07/21/2021 5:01:59 PM Version By: Haywood Pao EMT Entered By: Baltazar Najjar on 07/22/2021 10:12:08 -------------------------------------------------------------------------------- HBO Safety Checklist Details Patient Name: Date of Service: [name] IG 07/21/2021 10:00 A M Medical Record Number: [id number] Patient Account Number: [id number] Date of Birth/Sex: Treating RN: [birth year] (60 y.o. [name], Lauren Primary Care Hatley Henegar: [name] Other Clinician: Haywood Pao Referring Arelyn Gauer: Treating Jaxxen Voong/Extender: Avie Echevaria in Treatment: 6 HBO Safety Checklist Items Safety Checklist Consent Form Signed Patient voided / foley secured and emptied When did you last eato 0900 Last dose of injectable or oral agent n/a Ostomy pouch emptied and vented if applicable NA All implantable devices assessed, documented and approved NA Intravenous access site secured and place NA Valuables secured Linens and cotton and cotton/polyester blend (less than 51% polyester) Personal oil-based products / skin lotions / body lotions removed Wigs or hairpieces removed NA Smoking or tobacco materials removed NA Books / newspapers / magazines / loose paper removed Cologne, aftershave, perfume and deodorant removed Jewelry removed (may wrap wedding band) Make-up removed NA Hair care products removed Battery operated devices (external) removed Heating patches and chemical warmers removed Titanium eyewear removed NA Nail polish cured greater than 10 hours NA Casting material cured greater than 10 hours Cast applied at last Wound  Care Encounter Hearing aids removed NA Loose dentures or partials removed NA Prosthetics have been removed NA Patient demonstrates correct use of air break device (if applicable) Patient concerns have been addressed Patient grounding bracelet on and cord attached to chamber Specifics for Inpatients (complete in addition to above)  Medication sheet sent with patient NA Intravenous medications needed or due during therapy sent with patient NA Drainage tubes (e.g. nasogastric tube or chest tube secured and vented) NA Endotracheal or Tracheotomy tube secured NA Cuff deflated of air and inflated with saline NA Airway suctioned NA Notes Paper version used prior to treatment. Data entered after. Electronic Signature(s) Signed: 07/21/2021 6:24:56 PM By: Haywood Pao EMT Entered By: Haywood Pao on 07/21/2021 17:03:42

## 2021-07-22 NOTE — Progress Notes (Signed)
[name], GERALDS ([id number]) Visit Report for 07/21/2021 Debridement Details Patient Name: Date of Service: [name], [name] 07/21/2021 12:30 PM Medical Record Number: [id number] Patient Account Number: [id number] Date of Birth/Sex: Treating RN: [birth year] (60 y.o. [name] Primary Care Provider: [name] Other Clinician: Referring Provider: Treating Provider/Extender: Avie Echevaria in Treatment: 6 Debridement Performed for Assessment: Wound #3 Left,Plantar Foot Performed By: Physician Maxwell Caul., MD Debridement Type: Debridement Severity of Tissue Pre Debridement: Fat layer exposed Level of Consciousness (Pre-procedure): Awake and Alert Pre-procedure Verification/Time Out Yes - 13:27 Taken: Start Time: 13:27 T Area Debrided (L x W): otal 1.5 (cm) x 2.5 (cm) = 3.75 (cm) Tissue and other material debrided: Viable, Non-Viable, Callus, Slough, Subcutaneous, Biofilm, Slough Level: Skin/Subcutaneous Tissue Debridement Description: Excisional Instrument: Curette Bleeding: Moderate Hemostasis Achieved: Silver Nitrate End Time: 13:28 Procedural Pain: 0 Post Procedural Pain: 0 Response to Treatment: Procedure was tolerated well Level of Consciousness (Post- Awake and Alert procedure): Post Debridement Measurements of Total Wound Length: (cm) 1.5 Width: (cm) 2.5 Depth: (cm) 0.2 Volume: (cm) 0.589 Character of Wound/Ulcer Post Debridement: Improved Severity of Tissue Post Debridement: Fat layer exposed Post Procedure Diagnosis Same as Pre-procedure Electronic Signature(s) Signed: 07/21/2021 5:26:57 PM By: Fonnie Mu RN Signed: 07/22/2021 10:14:24 AM By: Baltazar Najjar MD Entered By: Baltazar Najjar on 07/21/2021 13:33:47 -------------------------------------------------------------------------------- HPI Details Patient Name: Date of Service: [name], WIMER IG 07/21/2021 12:30 PM Medical Record Number: [id number] Patient Account Number:  [id number] Date of Birth/Sex: Treating RN: [birth year] (60 y.o. [name] Primary Care Provider: [name] Other Clinician: Referring Provider: Treating Provider/Extender: Avie Echevaria in Treatment: 6 History of Present Illness HPI Description: 10/31/2019 upon evaluation today patient appears to be doing somewhat poorly in regard to his bilateral plantar feet. He has wounds that he tells me have been present since 2012 intermittently off and on. Most recently this has been open for at least the past 6 months to a year. He has been trying to treat this in different ways using Santyl along with various other dressings including Medihoney and even at one point Xeroform. Nothing really has seem to get this completely closed. He was recently in the hospital for cellulitis of his leg subsequently he did have x-rays as well as MRIs that showed negative for any signs of osteomyelitis in regard to the wounds on his feet. Fortunately there is no signs of systemic infection at this time. No fevers, chills, nausea, vomiting, or diarrhea. Patient has previously used Darco offloading shoes as far as frontal floaters as well as postop surgical shoes. He has never been in a total contact cast that may be something we need to strongly consider here. Patient's most recent hemoglobin A1c 1 month ago was 5.3 seems to be very well controlled which is great. Subsequently he has seen vascular as well as podiatry. His ABIs are 1.07 on the left and 1.14 on the right he seems to be doing well he does have chronic venous stasis. 11/07/2019 upon evaluation today patient appears to be doing well with regard to his wounds all things considered. I do not see any severe worsening he still has some callus buildup on the right more than the left he notes that he has been probably more active than he should as far as walking is concerned is just very hard to not be active. He knows he needs to be  more careful in this regard however. He is willing to give the cast  a try at this point although he notes that he is a little nervous about this just with regard to balance although he will be very careful and obviously if he has any trouble he knows to contact the office and let me know. 1/22; patient is in for his obligatory first total contact cast change. Our intake nurse reported a very large amount of drainage which is spelled out over to the surrounding skin. Has bilateral diabetic foot wounds. He has Charcot feet. We have been using silver alginate on his wounds. 11/14/2019 on evaluation today patient is actually seeming to make good improvement in regard to his bilateral plantar foot wounds. We have been using a cast on the left side and on the right side he has been using dressings he is changing up his own accord. With that being said he tells me that he is also not walking as much just due to how unsteady he feels. He takes it easy when he does have to walk and when he does not have to walk he is resting. This is probably help in his right foot as well has the left foot which is actually measuring better. In fact both are measuring better. Overall I am very pleased with how things seem to be progressing. The patient does have some odor on the left foot this does have me concerned about the possibility of infection, and actually probably go ahead and put him on antibiotics today as well as utilizing a continuation of the cast on the left foot I think that will be fine we probably just need to bring him in sooner to change this not last a whole week. 1/29; we brought the patient back today for a total contact cast change on the left out of concern for excessive drainage. We are using drawtex over the wound as the primary dressing 11/21/2019 on evaluation today patient appears to be doing well with regard to his left plantar foot. In fact both foot ulcers actually seem to be doing pretty  well. Nonetheless he is having a lot of drainage on the left at this time and again we did obtain a wound culture did show positive for Staph aureus that was reviewed by myself today as well. Nonetheless he is on Bactrim which was shown to be sensitive that should be helping in this regard. Fortunately there is no signs of infection systemically at this point. 2/5; back in clinic today for a total contact cast change apparently secondary to very significant drainage. Still using drawtex 11/28/2019 upon evaluation today patient appears to be doing well with regard to his wounds. The right foot is doing okay as measured about the same in my opinion. The left foot is actually showing signs of significant improvement is measuring smaller there is a lot of hyper granulation likely due to the continued drainage at this point. We did obtain approval for a snap VAC I think that is good to be appropriate for him and will likely help this tremendously underneath the cast. He is definitely in agreement with proceeding with such. 2/12; patient came in today after his snap VAC lost suction. Brought in to see one of our nurses. The dressing was replaced and then we put the cast back on and rehooked up the snap VAC. Apparently his wound looked very good per our intake nurse. 2/15; again we replaced the cast on Friday. By Saturday the snap VAC and light suction. He called this morning he comes in acutely.  The wounds look fine however the VAC is not functioning. We replaced the cast using silver alginate as the primary dressing backed with Kerramax. The snap VAC was not replaced 12/05/2019 upon evaluation today patient appears to be doing better in regard to his left plantar foot ulcer. Fortunately there is no signs of active infection at this time. Unfortunately he is continuing to have issues with the right foot he is really not making any progress here things seem to be somewhat stagnant to be honest. The depth has  increased but that is due to me having debrided the wound in the past based on what I am seeing. 12/12/2019 on evaluation today patient appears to be doing more poorly in regard to the left lower extremity. He has some erythema spreading up the side of his foot I am concerned about infection again at this point. Unfortunately he has been seeing improvement with a total contact cast but I do not think we should put that on today. On his right plantar foot he continues to have significant drainage this is actually measuring deeper I really do not feel like you are making any progress whatsoever. I have prescribed Granix for him unfortunately his insurance apparently was going to cost him a $500 co-pay. 12/19/2019 upon evaluation today patient actually appears to be doing better in regard to both wounds. With that being said he actually did get the reGranix which he had to pay $500 for. With that being said it does look like that he is actually made some improvement based on what I am seeing at this point with the reGranix. Obviously if he is going to continue this we are going to do something about trying to get him some help in covering the cost. 12/26/2019 on evaluation today patient appears to be doing really much better even compared to last week. Overall the wound seems to be much better even compared to last week and last week was better than the week before. Since has been using the reGranix his symptoms have improved significantly. With that being said the issue right now is simply that this is a very expensive medication for him the first dose cost him $500. Upon inspection patient's wound bed actually is however dramatically improved compared to before he started this 2 weeks ago. 01/02/2020 upon evaluation today patient appears to actually be doing well. He still had a little bit of reGranix left that has been using in small amounts he just been applying it every other day instead of every day in  order to make it go longer. Overall we are still seeing excellent improvement he is measuring smaller looking better healthier tissue and everything seems to be pointing to this headed in the right direction. Fortunately there is no evidence of infection either which is also excellent news. He does have his MRI coming up within the next week. 01/09/2020 upon evaluation today patient appears to be doing a little worse today compared to previous week's evaluation. He is actually been out of the reGranix at this point. He has been trying to make the stretch out so he has been changing the dressings on a regular set schedule like he was previous. I think this has made a difference. Fortunately there is no signs of active infection at this time. No fevers, chills, nausea, vomiting, or diarrhea. 01/16/2020 upon evaluation today patient appears to be doing well with regard to his left plantar foot ulcer. The right plantar foot still shows some significant  depth at this point. Fortunately there is no signs of active infection at this time. 01/30/2020 upon evaluation today patient appears to be doing about the same in regard to his right plantar foot ulcer there is still some depth here and we had to wait till he actually switched over to his new insurance to get approval for the MRI under his new insurance plan. With that being said he now has switched as of April 1. Fortunately there is no signs of active infection at this time. Overall in regard to his left foot ulcer this seems to be doing much better and I am actually very pleased with how things are going. With that being said it is not quite as much progress as we were seeing with the reGranix but at the same time he has had trouble getting this apparently there is been some hindrance here. I Ernie Hew try to actually send this to [facility] that was recommended by the drug rep to me. 02/13/20 upon evaluation today patient appears to be doing better in  regard to his left foot ulcer this is great news. Unfortunately the right foot ulcer is not really significantly better at this time. There is no signs of active infection systemically though he did have his MRI which showed unfortunately he does have infection noted including an abscess in the foot. There is also marrow changes noted which are consistent with osteomyelitis based on the radiology review and interpretation. Unfortunately considering that the wound is not really making the progress that we will he would like to be seen I think that this is an indication that he may need some further referral both infectious disease as well as potentially to podiatrist to see if there is anything that can be done to help with the situation that were dealing with here. The left foot again is doing great. READMISSION 06/04/2021 This is a 60 year old man who was in the clinic in 2021 followed by Allen Derry for areas on the right and left foot. He developed a left foot infection and was referred to ID. He left the clinic in a nonhealed state and was followed for a period of [facility] Marylene Land. Apparently things really deteriorated in early July when he was admitted to hospital from 04/27/2021 through 05/06/2021 with sepsis secondary to a left foot infection. His blood cultures were negative. An MRI suggested fifth metatarsal osteomyelitis a left ankle septic joint. He was treated with vancomycin and ertapenem which he is still taking and may just about be finishing. He was seen by orthopedics and the patient adamantly refused to BKA. As far as I can tell he did not have the ankle aspirated I am not exactly sure what the issue was here. Since he has been discharged she is at [facility] for rehabilitation. He was last seen by Dr. Algis Liming on 05/20/2021 he noted osteo of the tibial talar bone cuboid and fifth metatarsal which is even more extensive than what was suggested by the MRI.  He is apparently going for a consultation with orthopedic surgery in [place] sometime next week. I received a call about this man 2 weeks ago from Dr. Allyson Sabal who follows him for the possibility of PAD. ABIs I think done in the office showed a ABI on the right of 1.05 at the PTA and 0.99 at the PTA on the left. He had a DVT rule out in the left leg that was negative for the DVT. Past  medical history is extensive and includes diastolic heart failure, right first metatarsal head ulcer in 2021, excision of the right second ray by Dr. Marylene Land on 03/13/2020, hypertension, hypothyroidism. Left total hip replacement, right total knee replacement, carpal tunnel syndrome, obstructive sleep apnea alcohol abuse with cirrhosis although the patient denies current alcohol intake. The patient does not think he is a diabetic however looking through [place] link I see 2 HgbAic's of this year that were greater than equal to 6.5 which by definition makes him diabetic. Nevertheless he is not on any treatment and does not check his blood sugars. The patient is now back home out of the nursing home. Saw Dr. Algis Liming last week he was taken off vancomycin and ertapenem on August 22 and now is on doxycycline on Augmentin. He also saw Dr. Weston Anna who is his orthopedic surgeon in [place] he recommended a [employer]	[place]. He has been using Medihoney. 9/6I have been having trouble getting hyperbarics approved through our prior authorization process. Even though he had a limb threatening infection in the left foot and probably the left ankle there glitches in how some of the reports are worded also some of the consultants. In any case I am going to repeat his sedimentation rate and C-reactive protein. I am generally not in favor of doing things like this as they really do not alter the plan of care from my point of view however I am going to need to demonstrate that these remain high in order to get this through forhyperbaric  treatment for chronic refractory osteomyelitis 9/13; following this man for a wound on his left plantar foot in the setting of type 2 diabetes and Charcot deformity. He has underlying chronic refractory osteomyelitis. Follow-up sedimentation rate and C-reactive protein were both elevated but the C-reactive protein was down to 1.4, sedimentation rate at 70. Sedimentation rate was only slightly down from previous at 85. His wound is measuring slightly smaller. 9/20; patient started hyperbaric oxygen therapy today and tolerated treatment well. This is for the underlying osteomyelitis. He remains on antibiotics but thinks he is getting close to finishing. The wound on the plantar aspect of his foot is the other issue we are following here. He is using Medihoney The patient has a Charcot foot in the setting of type 2 diabetes. He is going to need a total contact cast although his partner was away this week and we elected to delay this till next week 9/27; patient still tolerating hyperbaric oxygen well. Wound looked generally healthy not much depth under illumination still 100% covered in fibrinous debris. Raised callused edges around the wound he was prepared for a total contact cast. We have been using Medihoney 9/30; patient is back for his first obligatory total contact cast change. We are using Hydrofera Blue. Noted by our intake nurse to have a lot of drainage or at least a moderate amount of drainage. I am not sure I was previously aware of this 10/4; patient arrives today with a lot of drainage under the cast. When he had it changed last Friday there was also a similar amount of drainage. Her intake nurse says that they tried a wound VAC on him perhaps while I was on vacation in August under the cast but that did not work. In my experience that has not been unusual. We have been using Hydrofera Blue with all the secondary absorbers. The drainage today went right through all of our dressings. The  patient is concerned about his foot  being in a cast without much drainage. He is tolerating HBO well. There has been improvements in the wound in the mid part of his foot in the setting of a Charcot deformity Electronic Signature(s) Signed: 07/22/2021 10:14:24 AM By: Baltazar Najjar MD Entered By: Baltazar Najjar on 07/21/2021 13:37:09 -------------------------------------------------------------------------------- Physical Exam Details Patient Name: Date of Service: LAVERN, MASLOW IG 07/21/2021 12:30 PM Medical Record Number: [id number] Patient Account Number: [id number] Date of Birth/Sex: Treating RN: 1961/05/01 (60 y.o. [name] Primary Care Provider: [name] Other Clinician: Referring Provider: Treating Provider/Extender: Avie Echevaria in Treatment: 6 Constitutional Sitting or standing Blood Pressure is within target range for patient.. Pulse regular and within target range for patient.Marland Kitchen Respirations regular, non-labored and within target range.. Temperature is normal and within the target range for the patient.Marland Kitchen Appears in no distress. Notes Wound exam; mid part of the left plantar foot. Under illumination the surface does not look completely vibrant. There is a nice rim of skin proximally and the dimensions have come down. I used a #5 curette to debride this to a healthier wound surface. Slight undermining also removed superiorly hemostasis with silver nitrate and direct pressure Electronic Signature(s) Signed: 07/22/2021 10:14:24 AM By: Baltazar Najjar MD Entered By: Baltazar Najjar on 07/21/2021 13:38:43 -------------------------------------------------------------------------------- Physician Orders Details Patient Name: Date of Service: FRANCISZEK, PLATTEN IG 07/21/2021 12:30 PM Medical Record Number: [id number] Patient Account Number: [id number] Date of Birth/Sex: Treating RN: 02/08/1961 (60 y.o. [name] Primary Care Provider: [name] Other Clinician: Referring Provider: Treating Provider/Extender: Avie Echevaria in Treatment: 6 Verbal / Phone Orders: No Diagnosis Coding ICD-10 Coding Code Description E11.621 Type 2 diabetes mellitus with foot ulcer M14.672 Charcot's joint, left ankle and foot L97.528 Non-pressure chronic ulcer of other part of left foot with other specified severity M86.372 Chronic multifocal osteomyelitis, left ankle and foot Follow-up Appointments ppointment in 1 week. - Tuesday Dr. Leanord Hawking Return A Other: - MD visit Friday for cast change Bathing/ Shower/ Hygiene May shower and wash wound with soap and water. - prior to dressing change Edema Control - Lymphedema / SCD / Other Elevate legs to the level of the heart or above for 30 minutes daily and/or when sitting, a frequency of: - throughout the day Avoid standing for long periods of time. Exercise regularly Moisturize legs daily. Off-Loading Total Contact Cast to Left Lower Extremity - Size 4 Additional Orders / Instructions Follow Nutritious Diet Hyperbaric Oxygen Therapy Wound #3 Left,Plantar Foot Evaluate for HBO Therapy - Starts 07/07/21 Indication: - Chronic Refractory Osteomyelitis 2.5 ATA for 90 Minutes with 2 Five (5) Minute A Breaks ir Total Number of Treatments: - 40 One treatments per day (delivered Monday through Friday unless otherwise specified in Special Instructions below): Finger stick Blood Glucose Pre- and Post- HBOT Treatment. Follow Hyperbaric Oxygen Glycemia Protocol Afrin (Oxymetazoline HCL) 0.05% nasal spray - 1 spray in both nostrils daily as needed prior to HBO treatment for difficulty clearing ears Wound Treatment Wound #3 - Foot Wound Laterality: Plantar, Left Cleanser: Soap and Water 2 x Per Week Discharge Instructions: May shower and wash wound with dial antibacterial soap and water prior to dressing change. Cleanser: Wound Cleanser 2 x Per Week Discharge Instructions: Cleanse  the wound with wound cleanser prior to applying a clean dressing using gauze sponges, not tissue or cotton balls. Peri-Wound Care: Zinc Oxide Ointment 30g tube 2 x Per Week Discharge Instructions: Apply Zinc Oxide to periwound with each dressing change Prim Dressing:  Hydrofera Blue Ready Foam, 2.5 x2.5 in 2 x Per Week ary Discharge Instructions: Apply to wound bed as instructed Secondary Dressing: ABD Pad, 5x9 2 x Per Week Discharge Instructions: Apply over primary dressing as directed. Secondary Dressing: Zetuvit Plus 4x8 in 2 x Per Week Discharge Instructions: Apply over primary dressing as directed. Secured With: [employer], 4.5x3.1 (in/yd) 2 x Per Week Discharge Instructions: Secure with Kerlix as directed. Secured With: 59M Medipore H Soft Cloth Surgical Tape, 2x2 (in/yd) 2 x Per Week Discharge Instructions: Secure dressing with tape as directed. GLYCEMIA INTERVENTIONS PROTOCOL PRE-HBO GLYCEMIA INTERVENTIONS ACTION INTERVENTION Obtain pre-HBO capillary blood glucose (ensure 1 physician order is in chart). A. Notify HBO physician and await physician orders. 2 If result is 70 mg/dl or below: B. If the result meets the hospital definition of a critical result, follow hospital policy. A. Give patient an 8 ounce Glucerna Shake, an 8 ounce Ensure, or 8 ounces of a Glucerna/Ensure equivalent dietary supplement*. B. Wait 30 minutes. If result is 71 mg/dl to 213 mg/dl: C. Retest patients capillary blood glucose (CBG). D. If result greater than or equal to 110 mg/dl, proceed with HBO. If result less than 110 mg/dl, notify HBO physician and consider holding HBO. If result is 131 mg/dl to 086 mg/dl: A. Proceed with HBO. A. Notify HBO physician and await physician orders. B. It is recommended to hold HBO and do If result is 250 mg/dl or greater: blood/urine ketone testing. C. If the result meets the hospital definition of a critical result, follow hospital  policy. POST-HBO GLYCEMIA INTERVENTIONS ACTION INTERVENTION Obtain post HBO capillary blood glucose (ensure 1 physician order is in chart). A. Notify HBO physician and await physician orders. 2 If result is 70 mg/dl or below: B. If the result meets the hospital definition of a critical result, follow hospital policy. A. Give patient an 8 ounce Glucerna Shake, an 8 ounce Ensure, or 8 ounces of a Glucerna/Ensure equivalent dietary supplement*. B. Wait 15 minutes for symptoms of If result is 71 mg/dl to 578 mg/dl: hypoglycemia (i.e. nervousness, anxiety, sweating, chills, clamminess, irritability, confusion, tachycardia or dizziness). C. If patient asymptomatic, discharge patient. If patient symptomatic, repeat capillary blood glucose (CBG) and notify HBO physician. If result is 101 mg/dl to 469 mg/dl: A. Discharge patient. A. Notify HBO physician and await physician orders. B. It is recommended to do blood/urine ketone If result is 250 mg/dl or greater: testing. C. If the result meets the hospital definition of a critical result, follow hospital policy. *Juice or candies are NOT equivalent products. If patient refuses the Glucerna or Ensure, please consult the hospital dietitian for an appropriate substitute. Electronic Signature(s) Signed: 07/21/2021 5:33:33 PM By: Zandra Abts RN, BSN Signed: 07/22/2021 10:14:24 AM By: Baltazar Najjar MD Entered By: Zandra Abts on 07/21/2021 13:31:42 -------------------------------------------------------------------------------- Problem List Details Patient Name: Date of Service: [name], MCGEEHAN IG 07/21/2021 12:30 PM Medical Record Number: [id number] Patient Account Number: [id number] Date of Birth/Sex: Treating RN: 23-Dec-1960 (60 y.o. [name] Primary Care Provider: [name] Other Clinician: Referring Provider: Treating Provider/Extender: Avie Echevaria in Treatment: 6 Active  Problems ICD-10 Encounter Code Description Active Date MDM Diagnosis E11.621 Type 2 diabetes mellitus with foot ulcer 06/04/2021 No Yes M14.672 Charcot's joint, left ankle and foot 06/04/2021 No Yes L97.528 Non-pressure chronic ulcer of other part of left foot with other specified 06/04/2021 No Yes severity M86.372 Chronic multifocal osteomyelitis, left ankle and foot 06/04/2021 No Yes Inactive Problems Resolved Problems Electronic  Signature(s) Signed: 07/22/2021 10:14:24 AM By: Baltazar Najjar MD Entered By: Baltazar Najjar on 07/21/2021 13:33:27 -------------------------------------------------------------------------------- Progress Note Details Patient Name: Date of Service: [name], CORSETTI IG 07/21/2021 12:30 PM Medical Record Number: [id number] Patient Account Number: [id number] Date of Birth/Sex: Treating RN: 09/03/1961 (60 y.o. [name] Primary Care Provider: [name] Other Clinician: Referring Provider: Treating Provider/Extender: Avie Echevaria in Treatment: 6 Subjective History of Present Illness (HPI) 10/31/2019 upon evaluation today patient appears to be doing somewhat poorly in regard to his bilateral plantar feet. He has wounds that he tells me have been present since 2012 intermittently off and on. Most recently this has been open for at least the past 6 months to a year. He has been trying to treat this in different ways using Santyl along with various other dressings including Medihoney and even at one point Xeroform. Nothing really has seem to get this completely closed. He was recently in the hospital for cellulitis of his leg subsequently he did have x-rays as well as MRIs that showed negative for any signs of osteomyelitis in regard to the wounds on his feet. Fortunately there is no signs of systemic infection at this time. No fevers, chills, nausea, vomiting, or diarrhea. Patient has previously used Darco offloading shoes as far as  frontal floaters as well as postop surgical shoes. He has never been in a total contact cast that may be something we need to strongly consider here. Patient's most recent hemoglobin A1c 1 month ago was 5.3 seems to be very well controlled which is great. Subsequently he has seen vascular as well as podiatry. His ABIs are 1.07 on the left and 1.14 on the right he seems to be doing well he does have chronic venous stasis. 11/07/2019 upon evaluation today patient appears to be doing well with regard to his wounds all things considered. I do not see any severe worsening he still has some callus buildup on the right more than the left he notes that he has been probably more active than he should as far as walking is concerned is just very hard to not be active. He knows he needs to be more careful in this regard however. He is willing to give the cast a try at this point although he notes that he is a little nervous about this just with regard to balance although he will be very careful and obviously if he has any trouble he knows to contact the office and let me know. 1/22; patient is in for his obligatory first total contact cast change. Our intake nurse reported a very large amount of drainage which is spelled out over to the surrounding skin. Has bilateral diabetic foot wounds. He has Charcot feet. We have been using silver alginate on his wounds. 11/14/2019 on evaluation today patient is actually seeming to make good improvement in regard to his bilateral plantar foot wounds. We have been using a cast on the left side and on the right side he has been using dressings he is changing up his own accord. With that being said he tells me that he is also not walking as much just due to how unsteady he feels. He takes it easy when he does have to walk and when he does not have to walk he is resting. This is probably help in his right foot as well has the left foot which is actually measuring better. In fact  both are measuring better. Overall I am very pleased  with how things seem to be progressing. The patient does have some odor on the left foot this does have me concerned about the possibility of infection, and actually probably go ahead and put him on antibiotics today as well as utilizing a continuation of the cast on the left foot I think that will be fine we probably just need to bring him in sooner to change this not last a whole week. 1/29; we brought the patient back today for a total contact cast change on the left out of concern for excessive drainage. We are using drawtex over the wound as the primary dressing 11/21/2019 on evaluation today patient appears to be doing well with regard to his left plantar foot. In fact both foot ulcers actually seem to be doing pretty well. Nonetheless he is having a lot of drainage on the left at this time and again we did obtain a wound culture did show positive for Staph aureus that was reviewed by myself today as well. Nonetheless he is on Bactrim which was shown to be sensitive that should be helping in this regard. Fortunately there is no signs of infection systemically at this point. 2/5; back in clinic today for a total contact cast change apparently secondary to very significant drainage. Still using drawtex 11/28/2019 upon evaluation today patient appears to be doing well with regard to his wounds. The right foot is doing okay as measured about the same in my opinion. The left foot is actually showing signs of significant improvement is measuring smaller there is a lot of hyper granulation likely due to the continued drainage at this point. We did obtain approval for a snap VAC I think that is good to be appropriate for him and will likely help this tremendously underneath the cast. He is definitely in agreement with proceeding with such. 2/12; patient came in today after his snap VAC lost suction. Brought in to see one of our nurses. The dressing was  replaced and then we put the cast back on and rehooked up the snap VAC. Apparently his wound looked very good per our intake nurse. 2/15; again we replaced the cast on Friday. By Saturday the snap VAC and light suction. He called this morning he comes in acutely. The wounds look fine however the VAC is not functioning. We replaced the cast using silver alginate as the primary dressing backed with Kerramax. The snap VAC was not replaced 12/05/2019 upon evaluation today patient appears to be doing better in regard to his left plantar foot ulcer. Fortunately there is no signs of active infection at this time. Unfortunately he is continuing to have issues with the right foot he is really not making any progress here things seem to be somewhat stagnant to be honest. The depth has increased but that is due to me having debrided the wound in the past based on what I am seeing. 12/12/2019 on evaluation today patient appears to be doing more poorly in regard to the left lower extremity. He has some erythema spreading up the side of his foot I am concerned about infection again at this point. Unfortunately he has been seeing improvement with a total contact cast but I do not think we should put that on today. On his right plantar foot he continues to have significant drainage this is actually measuring deeper I really do not feel like you are making any progress whatsoever. I have prescribed Granix for him unfortunately his insurance apparently was going to cost him  a $500 co-pay. 12/19/2019 upon evaluation today patient actually appears to be doing better in regard to both wounds. With that being said he actually did get the reGranix which he had to pay $500 for. With that being said it does look like that he is actually made some improvement based on what I am seeing at this point with the reGranix. Obviously if he is going to continue this we are going to do something about trying to get him some help in covering  the cost. 12/26/2019 on evaluation today patient appears to be doing really much better even compared to last week. Overall the wound seems to be much better even compared to last week and last week was better than the week before. Since has been using the reGranix his symptoms have improved significantly. With that being said the issue right now is simply that this is a very expensive medication for him the first dose cost him $500. Upon inspection patient's wound bed actually is however dramatically improved compared to before he started this 2 weeks ago. 01/02/2020 upon evaluation today patient appears to actually be doing well. He still had a little bit of reGranix left that has been using in small amounts he just been applying it every other day instead of every day in order to make it go longer. Overall we are still seeing excellent improvement he is measuring smaller looking better healthier tissue and everything seems to be pointing to this headed in the right direction. Fortunately there is no evidence of infection either which is also excellent news. He does have his MRI coming up within the next week. 01/09/2020 upon evaluation today patient appears to be doing a little worse today compared to previous week's evaluation. He is actually been out of the reGranix at this point. He has been trying to make the stretch out so he has been changing the dressings on a regular set schedule like he was previous. I think this has made a difference. Fortunately there is no signs of active infection at this time. No fevers, chills, nausea, vomiting, or diarrhea. 01/16/2020 upon evaluation today patient appears to be doing well with regard to his left plantar foot ulcer. The right plantar foot still shows some significant depth at this point. Fortunately there is no signs of active infection at this time. 01/30/2020 upon evaluation today patient appears to be doing about the same in regard to his right plantar  foot ulcer there is still some depth here and we had to wait till he actually switched over to his new insurance to get approval for the MRI under his new insurance plan. With that being said he now has switched as of April 1. Fortunately there is no signs of active infection at this time. Overall in regard to his left foot ulcer this seems to be doing much better and I am actually very pleased with how things are going. With that being said it is not quite as much progress as we were seeing with the reGranix but at the same time he has had trouble getting this apparently there is been some hindrance here. I Ernie Hew try to actually send this to [facility] that was recommended by the drug rep to me. 02/13/20 upon evaluation today patient appears to be doing better in regard to his left foot ulcer this is great news. Unfortunately the right foot ulcer is not really significantly better at this time. There is no signs of active infection systemically though  he did have his MRI which showed unfortunately he does have infection noted including an abscess in the foot. There is also marrow changes noted which are consistent with osteomyelitis based on the radiology review and interpretation. Unfortunately considering that the wound is not really making the progress that we will he would like to be seen I think that this is an indication that he may need some further referral both infectious disease as well as potentially to podiatrist to see if there is anything that can be done to help with the situation that were dealing with here. The left foot again is doing great. READMISSION 06/04/2021 This is a 60 year old man who was in the clinic in 2021 followed by Allen Derry for areas on the right and left foot. He developed a left foot infection and was referred to ID. He left the clinic in a nonhealed state and was followed for a period of [facility] Marylene Land. Apparently things  really deteriorated in early July when he was admitted to hospital from 04/27/2021 through 05/06/2021 with sepsis secondary to a left foot infection. His blood cultures were negative. An MRI suggested fifth metatarsal osteomyelitis a left ankle septic joint. He was treated with vancomycin and ertapenem which he is still taking and may just about be finishing. He was seen by orthopedics and the patient adamantly refused to BKA. As far as I can tell he did not have the ankle aspirated I am not exactly sure what the issue was here. Since he has been discharged she is at [facility] for rehabilitation. He was last seen by Dr. Algis Liming on 05/20/2021 he noted osteo of the tibial talar bone cuboid and fifth metatarsal which is even more extensive than what was suggested by the MRI. He is apparently going for a consultation with orthopedic surgery in [place] sometime next week. I received a call about this man 2 weeks ago from Dr. Allyson Sabal who follows him for the possibility of PAD. ABIs I think done in the office showed a ABI on the right of 1.05 at the PTA and 0.99 at the PTA on the left. He had a DVT rule out in the left leg that was negative for the DVT. Past medical history is extensive and includes diastolic heart failure, right first metatarsal head ulcer in 2021, excision of the right second ray by Dr. Marylene Land on 03/13/2020, hypertension, hypothyroidism. Left total hip replacement, right total knee replacement, carpal tunnel syndrome, obstructive sleep apnea alcohol abuse with cirrhosis although the patient denies current alcohol intake. The patient does not think he is a diabetic however looking through [place] link I see 2 HgbAic's of this year that were greater than equal to 6.5 which by definition makes him diabetic. Nevertheless he is not on any treatment and does not check his blood sugars. The patient is now back home out of the nursing home. Saw Dr. Algis Liming last week he was taken off  vancomycin and ertapenem on August 22 and now is on doxycycline on Augmentin. He also saw Dr. Weston Anna who is his orthopedic surgeon in [place] he recommended a [employer]	[place]. He has been using Medihoney. 9/6I have been having trouble getting hyperbarics approved through our prior authorization process. Even though he had a limb threatening infection in the left foot and probably the left ankle there glitches in how some of the reports are worded also some of the consultants. In any case I am going to repeat his sedimentation  rate and C-reactive protein. I am generally not in favor of doing things like this as they really do not alter the plan of care from my point of view however I am going to need to demonstrate that these remain high in order to get this through forhyperbaric treatment for chronic refractory osteomyelitis 9/13; following this man for a wound on his left plantar foot in the setting of type 2 diabetes and Charcot deformity. He has underlying chronic refractory osteomyelitis. Follow-up sedimentation rate and C-reactive protein were both elevated but the C-reactive protein was down to 1.4, sedimentation rate at 70. Sedimentation rate was only slightly down from previous at 85. His wound is measuring slightly smaller. 9/20; patient started hyperbaric oxygen therapy today and tolerated treatment well. This is for the underlying osteomyelitis. He remains on antibiotics but thinks he is getting close to finishing. The wound on the plantar aspect of his foot is the other issue we are following here. He is using Medihoney The patient has a Charcot foot in the setting of type 2 diabetes. He is going to need a total contact cast although his partner was away this week and we elected to delay this till next week 9/27; patient still tolerating hyperbaric oxygen well. Wound looked generally healthy not much depth under illumination still 100% covered in fibrinous debris. Raised callused edges  around the wound he was prepared for a total contact cast. We have been using Medihoney 9/30; patient is back for his first obligatory total contact cast change. We are using Hydrofera Blue. Noted by our intake nurse to have a lot of drainage or at least a moderate amount of drainage. I am not sure I was previously aware of this 10/4; patient arrives today with a lot of drainage under the cast. When he had it changed last Friday there was also a similar amount of drainage. Her intake nurse says that they tried a wound VAC on him perhaps while I was on vacation in August under the cast but that did not work. In my experience that has not been unusual. We have been using Hydrofera Blue with all the secondary absorbers. The drainage today went right through all of our dressings. The patient is concerned about his foot being in a cast without much drainage. He is tolerating HBO well. There has been improvements in the wound in the mid part of his foot in the setting of a Charcot deformity Objective Constitutional Sitting or standing Blood Pressure is within target range for patient.. Pulse regular and within target range for patient.Marland Kitchen Respirations regular, non-labored and within target range.. Temperature is normal and within the target range for the patient.Marland Kitchen Appears in no distress. Vitals Time Taken: 1:12 PM, Height: 73 in, Weight: 270 lbs, BMI: 35.6, Temperature: 97.7 F, Pulse: 102 bpm, Respiratory Rate: 18 breaths/min, Blood Pressure: 138/78 mmHg, Capillary Blood Glucose: 152 mg/dl. General Notes: Wound exam; mid part of the left plantar foot. Under illumination the surface does not look completely vibrant. There is a nice rim of skin proximally and the dimensions have come down. I used a #5 curette to debride this to a healthier wound surface. Slight undermining also removed superiorly hemostasis with silver nitrate and direct pressure Integumentary (Hair, Skin) Wound #3 status is Open. Original  cause of wound was Gradually Appeared. The date acquired was: 03/18/2021. The wound has been in treatment 6 weeks. The wound is located on the Left,Plantar Foot. The wound measures 1.5cm length x 2.5cm width x 0.2cm  depth; 2.945cm^2 area and 0.589cm^3 volume. There is no tunneling or undermining noted. There is a medium amount of serosanguineous drainage noted. The wound margin is distinct with the outline attached to the wound base. There is large (67-100%) red, pink granulation within the wound bed. There is a small (1-33%) amount of necrotic tissue within the wound bed including Adherent Slough. Assessment Active Problems ICD-10 Type 2 diabetes mellitus with foot ulcer Charcot's joint, left ankle and foot Non-pressure chronic ulcer of other part of left foot with other specified severity Chronic multifocal osteomyelitis, left ankle and foot Procedures Wound #3 Pre-procedure diagnosis of Wound #3 is a Diabetic Wound/Ulcer of the Lower Extremity located on the Left,Plantar Foot .Severity of Tissue Pre Debridement is: Fat layer exposed. There was a Excisional Skin/Subcutaneous Tissue Debridement with a total area of 3.75 sq cm performed by Maxwell Caul., MD. With the following instrument(s): Curette to remove Viable and Non-Viable tissue/material. Material removed includes Callus, Subcutaneous Tissue, Slough, and Biofilm. No specimens were taken. A time out was conducted at 13:27, prior to the start of the procedure. A Moderate amount of bleeding was controlled with Silver Nitrate. The procedure was tolerated well with a pain level of 0 throughout and a pain level of 0 following the procedure. Post Debridement Measurements: 1.5cm length x 2.5cm width x 0.2cm depth; 0.589cm^3 volume. Character of Wound/Ulcer Post Debridement is improved. Severity of Tissue Post Debridement is: Fat layer exposed. Post procedure Diagnosis Wound #3: Same as Pre-Procedure Pre-procedure diagnosis of Wound #3 is a  Diabetic Wound/Ulcer of the Lower Extremity located on the Left,Plantar Foot . There was a T Contact Cast otal Procedure by Maxwell Caul., MD. Post procedure Diagnosis Wound #3: Same as Pre-Procedure Plan Follow-up Appointments: Return Appointment in 1 week. - Tuesday Dr. Leanord Hawking Other: - MD visit Friday for cast change Bathing/ Shower/ Hygiene: May shower and wash wound with soap and water. - prior to dressing change Edema Control - Lymphedema / SCD / Other: Elevate legs to the level of the heart or above for 30 minutes daily and/or when sitting, a frequency of: - throughout the day Avoid standing for long periods of time. Exercise regularly Moisturize legs daily. Off-Loading: T Contact Cast to Left Lower Extremity - Size 4 otal Additional Orders / Instructions: Follow Nutritious Diet Hyperbaric Oxygen Therapy: Wound #3 Left,Plantar Foot: Evaluate for HBO Therapy - Starts 07/07/21 Indication: - Chronic Refractory Osteomyelitis 2.5 ATA for 90 Minutes with 2 Five (5) Minute Air Breaks T Number of Treatments: - 40 otal One treatments per day (delivered Monday through Friday unless otherwise specified in Special Instructions below): Finger stick Blood Glucose Pre- and Post- HBOT Treatment. Follow Hyperbaric Oxygen Glycemia Protocol Afrin (Oxymetazoline HCL) 0.05% nasal spray - 1 spray in both nostrils daily as needed prior to HBO treatment for difficulty clearing ears WOUND #3: - Foot Wound Laterality: Plantar, Left Cleanser: Soap and Water 2 x Per Week/ Discharge Instructions: May shower and wash wound with dial antibacterial soap and water prior to dressing change. Cleanser: Wound Cleanser 2 x Per Week/ Discharge Instructions: Cleanse the wound with wound cleanser prior to applying a clean dressing using gauze sponges, not tissue or cotton balls. Peri-Wound Care: Zinc Oxide Ointment 30g tube 2 x Per Week/ Discharge Instructions: Apply Zinc Oxide to periwound with each dressing  change Prim Dressing: Hydrofera Blue Ready Foam, 2.5 x2.5 in 2 x Per Week/ ary Discharge Instructions: Apply to wound bed as instructed Secondary Dressing: ABD Pad, 5x9 2  x Per Week/ Discharge Instructions: Apply over primary dressing as directed. Secondary Dressing: Zetuvit Plus 4x8 in 2 x Per Week/ Discharge Instructions: Apply over primary dressing as directed. Secured With: [employer], 4.5x3.1 (in/yd) 2 x Per Week/ Discharge Instructions: Secure with Kerlix as directed. Secured With: 37M Medipore H Soft Cloth Surgical T ape, 2x2 (in/yd) 2 x Per Week/ Discharge Instructions: Secure dressing with tape as directed. 1. After discussion with the patient we elected to continue the cast with Hydrofera Blue but change it twice a week to capture any potential damaging drainage. 2. As mentioned they had already tried a wound VAC under the TCC and that apparently did not work per our intake nurse 3. Debridement today. Surface looks quite good. Careful attention to the dimensions 4. The patient has a knee scooter at home he was not able to use this because of musculoskeletal issues in the upper right thigh and hip Electronic Signature(s) Signed: 07/22/2021 10:14:24 AM By: Baltazar Najjar MD Entered By: Baltazar Najjar on 07/21/2021 13:39:49 -------------------------------------------------------------------------------- Total Contact Cast Details Patient Name: Date of Service: [name], MOFIELD IG 07/21/2021 12:30 PM Medical Record Number: [id number] Patient Account Number: [id number] Date of Birth/Sex: Treating RN: 12/02/1960 (60 y.o. [name] Primary Care Provider: [name] Other Clinician: Referring Provider: Treating Provider/Extender: Avie Echevaria in Treatment: 6 T Contact Cast Applied for Wound Assessment: otal Wound #3 Left,Plantar Foot Performed By: Physician Maxwell Caul., MD Post Procedure Diagnosis Same as Pre-procedure Electronic  Signature(s) Signed: 07/22/2021 10:14:24 AM By: Baltazar Najjar MD Entered By: Baltazar Najjar on 07/21/2021 13:34:35 -------------------------------------------------------------------------------- SuperBill Details Patient Name: Date of Service: [name], KOPKE IG 07/21/2021 Medical Record Number: [id number] Patient Account Number: [id number] Date of Birth/Sex: Treating RN: 12/22/1960 (60 y.o. Charlean Merl, Lauren Primary Care Provider: [name] Other Clinician: Referring Provider: Treating Provider/Extender: Avie Echevaria in Treatment: 6 Diagnosis Coding ICD-10 Codes Code Description E11.621 Type 2 diabetes mellitus with foot ulcer M14.672 Charcot's joint, left ankle and foot L97.528 Non-pressure chronic ulcer of other part of left foot with other specified severity M86.372 Chronic multifocal osteomyelitis, left ankle and foot Facility Procedures CPT4 Code: 08657846 Description: 11042 - DEB SUBQ TISSUE 20 SQ CM/< ICD-10 Diagnosis Description M86.372 Chronic multifocal osteomyelitis, left ankle and foot E11.621 Type 2 diabetes mellitus with foot ulcer Modifier: Quantity: 1 Physician Procedures : CPT4 Code Description Modifier [id number] 11042 - WC PHYS SUBQ TISS 20 SQ CM ICD-10 Diagnosis Description M86.372 Chronic multifocal osteomyelitis, left ankle and foot E11.621 Type 2 diabetes mellitus with foot ulcer Quantity: 1 Electronic Signature(s) Signed: 07/22/2021 10:14:24 AM By: Baltazar Najjar MD Entered By: Baltazar Najjar on 07/21/2021 13:40:06

## 2021-07-22 NOTE — Progress Notes (Addendum)
[name], [name] ([id number]) Visit Report for 07/22/2021 Arrival Information Details Patient Name: Date of Service: [name], [name] 07/22/2021 10:00 A M Medical Record Number: [id number] Patient Account Number: [id number] Date of Birth/Sex: Treating RN: [birth year] (60 y.o. [name] Primary Care Berneita Sanagustin: [name] Other Clinician: Haywood Pao Referring Elam Ellis: Treating Stellarose Cerny/Extender: [name] in Treatment: 6 Visit Information History Since Last Visit All ordered tests and consults were completed: Yes Patient Arrived: [name] Added or deleted any medications: No Arrival Time: 10:09 Any new allergies or adverse reactions: No Accompanied By: self Had a fall or experienced change in No Transfer Assistance: None activities of daily living that may affect Patient Identification Verified: Yes risk of falls: Secondary Verification Process Completed: Yes Signs or symptoms of abuse/neglect since last No Patient Requires Transmission-Based Precautions: No visito Patient Has Alerts: Yes Hospitalized since last visit: No Implantable device outside of the clinic No excluding cellular tissue based products placed in the center since last visit: Has Dressing in Place as Prescribed: Yes Has Footwear/Offloading in Place as Yes Prescribed: Left: Removable Cast Walker/Walking Boot T Contact Cast otal Pain Present Now: No Electronic Signature(s) Signed: 07/22/2021 12:11:49 PM By: Haywood Pao EMT Entered By: Haywood Pao on 07/22/2021 11:57:10 -------------------------------------------------------------------------------- Encounter Discharge Information Details Patient Name: Date of Service: [name] 07/22/2021 10:00 A M Medical Record Number: [id number] Patient Account Number: [id number] Date of Birth/Sex: Treating RN: September 07, 1961 (60 y.o. [name] Primary Care Colbi Staubs: [name] Other Clinician: Haywood Pao Referring Gael Londo: Treating Lexani Corona/Extender: [name] in Treatment: 6 Encounter Discharge Information Items Discharge Condition: Stable Ambulatory Status: Walker Discharge Destination: Home Transportation: Private Auto Accompanied By: self Schedule Follow-up Appointment: No Clinical Summary of Care: Electronic Signature(s) Signed: 07/22/2021 8:58:03 PM By: Haywood Pao EMT Signed: 07/22/2021 8:58:03 PM By: Haywood Pao EMT Entered By: Haywood Pao on 07/22/2021 16:42:46 -------------------------------------------------------------------------------- Vitals Details Patient Name: Date of Service: [name] 07/22/2021 10:00 A M Medical Record Number: [id number] Patient Account Number: [id number] Date of Birth/Sex: Treating RN: Jan 22, 1961 (60 y.o. [name] Primary Care Charliegh Vasudevan: [name] Other Clinician: Haywood Pao Referring Ertha Nabor: Treating Mellie Buccellato/Extender: Atlee Abide Weeks in Treatment: 6 Vital Signs Time Taken: 10:19 Temperature (F): 98.0 Height (in): 73 Pulse (bpm): 97 Weight (lbs): 270 Respiratory Rate (breaths/min): 18 Body Mass Index (BMI): 35.6 Blood Pressure (mmHg): 132/69 Capillary Blood Glucose (mg/dl): 735 Reference Range: 80 - 120 mg / dl Electronic Signature(s) Signed: 07/22/2021 12:11:49 PM By: Haywood Pao EMT Entered By: Haywood Pao on 07/22/2021 11:41:28

## 2021-07-23 ENCOUNTER — Encounter (HOSPITAL_BASED_OUTPATIENT_CLINIC_OR_DEPARTMENT_OTHER): Payer: Medicare Other | Admitting: Internal Medicine

## 2021-07-23 DIAGNOSIS — E11621 Type 2 diabetes mellitus with foot ulcer: Secondary | ICD-10-CM | POA: Diagnosis not present

## 2021-07-23 LAB — GLUCOSE, CAPILLARY
Glucose-Capillary: 145 mg/dL — ABNORMAL HIGH (ref 70–99)
Glucose-Capillary: 135 mg/dL — ABNORMAL HIGH (ref 70–99)

## 2021-07-23 NOTE — Progress Notes (Signed)
EAMON, TANTILLO ([id number]) Visit Report for 07/23/2021 SuperBill Details Patient Name: Date of Service: [name], [name] 07/23/2021 Medical Record Number: [id number] Patient Account Number: [id number] Date of Birth/Sex: Treating RN: 1960-12-20 (60 y.o. [name] Primary Care Provider: [name] Other Clinician: Haywood Pao Referring Provider: Treating Provider/Extender: Avie Echevaria in Treatment: 7 Diagnosis Coding ICD-10 Codes Code Description E11.621 Type 2 diabetes mellitus with foot ulcer M14.672 Charcot's joint, left ankle and foot L97.528 Non-pressure chronic ulcer of other part of left foot with other specified severity M86.372 Chronic multifocal osteomyelitis, left ankle and foot Facility Procedures CPT4 Code Description Modifier Quantity [id number] G0277-(Facility Use Only) HBOT full body chamber, , 4 ICD-10 Diagnosis Description E11.621 Type 2 diabetes mellitus with foot ulcer M14.672 Charcot's joint, left ankle and foot L97.528 Non-pressure chronic ulcer of other part of left foot with other specified severity M86.372 Chronic multifocal osteomyelitis, left ankle and foot Physician Procedures Quantity CPT4 Code Description Modifier [id number] 99183 - WC PHYS HYPERBARIC OXYGEN THERAPY 1 ICD-10 Diagnosis Description E11.621 Type 2 diabetes mellitus with foot ulcer M14.672 Charcot's joint, left ankle and foot L97.528 Non-pressure chronic ulcer of other part of left foot with other specified severity M86.372 Chronic multifocal osteomyelitis, left ankle and foot Electronic Signature(s) Signed: 07/23/2021 12:22:22 PM By: Haywood Pao EMT Signed: 07/23/2021 5:45:05 PM By: Baltazar Najjar MD Entered By: Haywood Pao on 07/23/2021 12:22:22

## 2021-07-23 NOTE — Progress Notes (Addendum)
[name] ([id number]) Visit Report for 07/23/2021 HBO Details Patient Name: Date of Service: [name], [name] 07/23/2021 10:00 A M Medical Record Number: [id number] Patient Account Number: [id number] Date of Birth/Sex: Treating RN: 19-Feb-1961 (60 y.o. [name] Primary Care Mykai Wendorf: [name] Other Clinician: Haywood Pao Referring Jolina Symonds: Treating Davinci Glotfelty/Extender: Avie Echevaria in Treatment: 7 HBO Treatment Course Details Treatment Course Number: 1 Ordering Shawntee Mainwaring: Baltazar Najjar T Treatments Ordered: otal 40 HBO Treatment Start Date: 07/07/2021 HBO Indication: Chronic Refractory Osteomyelitis to Left,Plantar Foot HBO Treatment Details Treatment Number: 11 Patient Type: Outpatient Chamber Type: Monoplace Chamber Serial #: [id number] Treatment Protocol: 2.5 ATA with 90 minutes oxygen, with two 5 minute air breaks Treatment Details Compression Rate Down: 2.0 psi / minute De-Compression Rate Up: 2.0 psi / minute A breaks and breathing ir Compress Tx Pressure periods Decompress Decompress Begins Reached (leave unused spaces Begins Ends blank) Chamber Pressure (ATA 1 2.5 2.5 2.5 2.5 2.5 - - 2.5 1 ) Clock Time (24 hr) 09:59 10:10 10:40 10:45 11:15 11:20 - - 11:45 11:56 Treatment Length: 117 (minutes) Treatment Segments: 4 Vital Signs Capillary Blood Glucose Reference Range: 80 - 120 mg / dl HBO Diabetic Blood Glucose Intervention Range: <131 mg/dl or >086 mg/dl Time Vitals Blood Respiratory Capillary Blood Glucose Pulse Action Type: Pulse: Temperature: Taken: Pressure: Rate: Glucose (mg/dl): Meter #: Oximetry (%) Taken: Pre 09:31 122/77 98 18 98.2 145 Post 12:03 123/73 79 18 97.4 135 Treatment Response Treatment Toleration: Well Treatment Completion Status: Treatment Completed without Adverse Event Calloway Andrus Notes No concerns with treatment given Physician HBO Attestation: I certify that I supervised this HBO treatment in  accordance with Medicare guidelines. A trained emergency response team is readily available per Yes hospital policies and procedures. Continue HBOT as ordered. Yes Electronic Signature(s) Signed: 07/23/2021 5:45:05 PM By: Baltazar Najjar MD Previous Signature: 07/23/2021 12:21:57 PM Version By: Haywood Pao EMT Entered By: Baltazar Najjar on 07/23/2021 17:43:43 -------------------------------------------------------------------------------- HBO Safety Checklist Details Patient Name: Date of Service: [name] IG 07/23/2021 10:00 A M Medical Record Number: [id number] Patient Account Number: [id number] Date of Birth/Sex: Treating RN: 01-23-1961 (60 y.o. [name], [name] Primary Care Sherica Paternostro: [name] Other Clinician: Haywood Pao Referring Lourene Hoston: Treating Raechel Marcos/Extender: Avie Echevaria in Treatment: 7 HBO Safety Checklist Items Safety Checklist Consent Form Signed Patient voided / foley secured and emptied When did you last eato last evening Last dose of injectable or oral agent n/a Ostomy pouch emptied and vented if applicable NA All implantable devices assessed, documented and approved NA Intravenous access site secured and place NA Valuables secured Linens and cotton and cotton/polyester blend (less than 51% polyester) Personal oil-based products / skin lotions / body lotions removed Wigs or hairpieces removed NA Smoking or tobacco materials removed NA Books / newspapers / magazines / loose paper removed Cologne, aftershave, perfume and deodorant removed Jewelry removed (may wrap wedding band) Make-up removed NA Hair care products removed Battery operated devices (external) removed Heating patches and chemical warmers removed Titanium eyewear removed NA Nail polish cured greater than 10 hours NA Casting material cured greater than 10 hours Cast applied at last Wound Care Encounter Hearing aids removed NA Loose dentures or  partials removed NA Prosthetics have been removed NA Patient demonstrates correct use of air break device (if applicable) Patient concerns have been addressed Patient grounding bracelet on and cord attached to chamber Specifics for Inpatients (complete in addition to above) Medication sheet sent with patient NA Intravenous medications needed  or due during therapy sent with patient NA Drainage tubes (e.g. nasogastric tube or chest tube secured and vented) NA Endotracheal or Tracheotomy tube secured NA Cuff deflated of air and inflated with saline NA Airway suctioned NA Electronic Signature(s) Signed: 07/23/2021 9:52:37 AM By: Haywood Pao EMT Entered By: Haywood Pao on 07/23/2021 09:52:37

## 2021-07-23 NOTE — Progress Notes (Addendum)
[name], [name] ([id number]) Visit Report for 07/23/2021 Arrival Information Details Patient Name: Date of Service: [name], [name] 07/23/2021 10:00 A M Medical Record Number: [id number] Patient Account Number: [id number] Date of Birth/Sex: Treating RN: [birth year] (60 y.o. [name] Primary Care Curtez Brallier: [name] Other Clinician: Haywood Pao Referring Nazareth Kirk: Treating Marycarmen Hagey/Extender: Avie Echevaria in Treatment: 7 Visit Information History Since Last Visit All ordered tests and consults were completed: Yes Patient Arrived: [name] Added or deleted any medications: No Arrival Time: 09:24 Any new allergies or adverse reactions: No Accompanied By: self Had a fall or experienced change in No Transfer Assistance: Manual activities of daily living that may affect Patient Identification Verified: Yes risk of falls: Secondary Verification Process Completed: Yes Signs or symptoms of abuse/neglect since last visito No Patient Requires Transmission-Based Precautions: No Hospitalized since last visit: No Patient Has Alerts: Yes Implantable device outside of the clinic excluding No cellular tissue based products placed in the center since last visit: Pain Present Now: No Electronic Signature(s) Signed: 07/23/2021 9:35:40 AM By: Haywood Pao EMT Entered By: Haywood Pao on 07/23/2021 09:35:40 -------------------------------------------------------------------------------- Encounter Discharge Information Details Patient Name: Date of Service: [name] 07/23/2021 10:00 A M Medical Record Number: [id number] Patient Account Number: [id number] Date of Birth/Sex: Treating RN: [birth year] (60 y.o. [name] Primary Care Jelina Paulsen: [name] Other Clinician: Haywood Pao Referring Sayana Salley: Treating Deberah Adolf/Extender: Avie Echevaria in Treatment: 7 Encounter Discharge Information Items Discharge Condition:  Stable Ambulatory Status: Walker Discharge Destination: Home Transportation: Private Auto Accompanied By: self Schedule Follow-up Appointment: No Clinical Summary of Care: Electronic Signature(s) Signed: 07/23/2021 12:22:52 PM By: Haywood Pao EMT Entered By: Haywood Pao on 07/23/2021 12:22:52 -------------------------------------------------------------------------------- Vitals Details Patient Name: Date of Service: [name] 07/23/2021 10:00 A M Medical Record Number: [id number] Patient Account Number: [id number] Date of Birth/Sex: Treating RN: 11-02-1960 (60 y.o. [name] Primary Care Crystal Scarberry: [name] Other Clinician: Haywood Pao Referring Irl Bodie: Treating Keithan Dileonardo/Extender: Avie Echevaria in Treatment: 7 Vital Signs Time Taken: 09:31 Temperature (F): 98.2 Height (in): 73 Pulse (bpm): 98 Weight (lbs): 270 Respiratory Rate (breaths/min): 18 Body Mass Index (BMI): 35.6 Blood Pressure (mmHg): 122/77 Capillary Blood Glucose (mg/dl): 500 Reference Range: 80 - 120 mg / dl Electronic Signature(s) Signed: 07/23/2021 9:50:36 AM By: Haywood Pao EMT Entered By: Haywood Pao on 07/23/2021 09:50:36

## 2021-07-24 ENCOUNTER — Encounter (HOSPITAL_BASED_OUTPATIENT_CLINIC_OR_DEPARTMENT_OTHER): Payer: Medicare Other | Admitting: Internal Medicine

## 2021-07-24 ENCOUNTER — Other Ambulatory Visit: Payer: Self-pay

## 2021-07-24 DIAGNOSIS — M14672 Charcot's joint, left ankle and foot: Secondary | ICD-10-CM

## 2021-07-24 DIAGNOSIS — M86372 Chronic multifocal osteomyelitis, left ankle and foot: Secondary | ICD-10-CM

## 2021-07-24 DIAGNOSIS — E11621 Type 2 diabetes mellitus with foot ulcer: Secondary | ICD-10-CM | POA: Diagnosis not present

## 2021-07-24 DIAGNOSIS — L97528 Non-pressure chronic ulcer of other part of left foot with other specified severity: Secondary | ICD-10-CM

## 2021-07-24 LAB — GLUCOSE, CAPILLARY
Glucose-Capillary: 166 mg/dL — ABNORMAL HIGH (ref 70–99)
Glucose-Capillary: 111 mg/dL — ABNORMAL HIGH (ref 70–99)

## 2021-07-24 NOTE — Progress Notes (Signed)
COSTANTINO, KOHLBECK ([id number]) Visit Report for 07/22/2021 SuperBill Details Patient Name: Date of Service: OCTAVIANO, MUKAI 07/22/2021 Medical Record Number: [id number] Patient Account Number: [id number] Date of Birth/Sex: Treating RN: September 05, 1961 (60 y.o. [name] Primary Care Provider: [name] Other Clinician: Haywood Pao Referring Provider: Treating Provider/Extender: Atlee Abide Weeks in Treatment: 6 Diagnosis Coding ICD-10 Codes Code Description [phone] Type 2 diabetes mellitus with foot ulcer M14.672 Charcot's joint, left ankle and foot L97.528 Non-pressure chronic ulcer of other part of left foot with other specified severity M86.372 Chronic multifocal osteomyelitis, left ankle and foot Facility Procedures CPT4 Code Description Modifier Quantity [id number] G0277-(Facility Use Only) HBOT full body chamber, , 4 ICD-10 Diagnosis Description E11.621 Type 2 diabetes mellitus with foot ulcer M14.672 Charcot's joint, left ankle and foot L97.528 Non-pressure chronic ulcer of other part of left foot with other specified severity M86.372 Chronic multifocal osteomyelitis, left ankle and foot Physician Procedures Quantity CPT4 Code Description Modifier [id number] 99183 - WC PHYS HYPERBARIC OXYGEN THERAPY 1 ICD-10 Diagnosis Description E11.621 Type 2 diabetes mellitus with foot ulcer M14.672 Charcot's joint, left ankle and foot L97.528 Non-pressure chronic ulcer of other part of left foot with other specified severity M86.372 Chronic multifocal osteomyelitis, left ankle and foot Electronic Signature(s) Signed: 07/22/2021 8:58:03 PM By: Haywood Pao EMT Signed: 07/24/2021 9:30:20 AM By: Lenda Kelp PA-C Entered By: Haywood Pao on 07/22/2021 16:42:22

## 2021-07-24 NOTE — Progress Notes (Addendum)
[name], [name] ([id number]) Visit Report for 07/24/2021 HBO Details Patient Name: Date of Service: [name], [name] 07/24/2021 10:00 A M Medical Record Number: [id number] Patient Account Number: [id number] Date of Birth/Sex: Treating RN: 03-25-1961 (60 y.o. [name] Primary Care [name]: [name] Other Clinician: Haywood Pao Referring Kinte Trim: Treating [name]/Extender: [name] in Treatment: 7 HBO Treatment Course Details Treatment Course Number: 1 Ordering Osmara Drummonds: Baltazar Najjar T Treatments Ordered: otal 40 HBO Treatment Start Date: 07/07/2021 HBO Indication: Chronic Refractory Osteomyelitis to Left,Plantar Foot HBO Treatment Details Treatment Number: 12 Patient Type: Outpatient Chamber Type: Monoplace Chamber Serial #: [id number] Treatment Protocol: 2.5 ATA with 90 minutes oxygen, with two 5 minute air breaks Treatment Details Compression Rate Down: 2.0 psi / minute De-Compression Rate Up: 2.0 psi / minute A breaks and breathing ir Compress Tx Pressure periods Decompress Decompress Begins Reached (leave unused spaces Begins Ends blank) Chamber Pressure (ATA 1 2.5 2.5 2.5 2.5 2.5 - - 2.5 1 ) Clock Time (24 hr) 10:55 11:04 11:34 11:39 12:09 12:14 - - 12:33 12:42 Treatment Length: 107 (minutes) Treatment Segments: 4 Vital Signs Capillary Blood Glucose Reference Range: 80 - 120 mg / dl HBO Diabetic Blood Glucose Intervention Range: <131 mg/dl or >528 mg/dl Type: Time Vitals Blood Pulse: Respiratory Temperature: Capillary Blood Glucose Pulse Action Taken: Pressure: Rate: Glucose (mg/dl): Meter #: Oximetry (%) Taken: Pre 10:34 118/72 102 18 97.8 166 2 Post 13:02 125/89 81 18 97.6 111 2 Patient discharged per [facility] protocol. Treatment Response Treatment Toleration: Well Treatment Completion Status: Treatment Completed without Adverse Event Electronic Signature(s) Signed: 07/24/2021 2:04:35 PM By: Haywood Pao EMT Signed:  07/27/2021 9:40:45 AM By: [name] DO Entered By: Haywood Pao on 07/24/2021 14:04:35 -------------------------------------------------------------------------------- HBO Safety Checklist Details Patient Name: Date of Service: [name] 07/24/2021 10:00 A M Medical Record Number: [id number] Patient Account Number: [id number] Date of Birth/Sex: Treating RN: 05/01/1961 (60 y.o. [name] Primary Care Sterlin Knightly: [name] Other Clinician: Haywood Pao Referring Trenton Passow: Treating Kathy Wahid/Extender: [name] in Treatment: 7 HBO Safety Checklist Items Safety Checklist Consent Form Signed Patient voided / foley secured and emptied When did you last eato Breakfast Last dose of injectable or oral agent n/a Ostomy pouch emptied and vented if applicable NA All implantable devices assessed, documented and approved NA Intravenous access site secured and place NA Valuables secured Linens and cotton and cotton/polyester blend (less than 51% polyester) Personal oil-based products / skin lotions / body lotions removed Wigs or hairpieces removed NA Smoking or tobacco materials removed NA Books / newspapers / magazines / loose paper removed Cologne, aftershave, perfume and deodorant removed Jewelry removed (may wrap wedding band) NA Make-up removed NA Hair care products removed Battery operated devices (external) removed Heating patches and chemical warmers removed Titanium eyewear removed NA Nail polish cured greater than 10 hours NA Casting material cured greater than 10 hours Cast applied at last Wound Encounter Hearing aids removed NA Loose dentures or partials removed NA Prosthetics have been removed NA Patient demonstrates correct use of air break device (if applicable) Patient concerns have been addressed Patient grounding bracelet on and cord attached to chamber Specifics for Inpatients (complete in addition to  above) Medication sheet sent with patient NA Intravenous medications needed or due during therapy sent with patient NA Drainage tubes (e.g. nasogastric tube or chest tube secured and vented) NA Endotracheal or Tracheotomy tube secured NA Cuff deflated of air and inflated with saline NA Airway suctioned NA  Notes Paper version used prior to treatment. Electronic Signature(s) Signed: 07/24/2021 2:00:56 PM By: Haywood Pao EMT Previous Signature: 07/24/2021 2:00:21 PM Version By: Haywood Pao EMT Entered By: Haywood Pao on 07/24/2021 14:00:56

## 2021-07-24 NOTE — Progress Notes (Signed)
[name], [name] ([id number]) Visit Report for 07/22/2021 HBO Details Patient Name: Date of Service: [name], [name] 07/22/2021 10:00 A M Medical Record Number: [id number] Patient Account Number: [id number] Date of Birth/Sex: Treating RN: 02/01/1961 (60 y.o. [name] Primary Care Rie Mcneil: [name] Other Clinician: Haywood Pao Referring Rea Kalama: Treating Zhana Jeangilles/Extender: [name] in Treatment: 6 HBO Treatment Course Details Treatment Course Number: 1 Ordering Liliauna Santoni: Baltazar Najjar T Treatments Ordered: otal 40 HBO Treatment Start Date: 07/07/2021 HBO Indication: Chronic Refractory Osteomyelitis to Left,Plantar Foot HBO Treatment Details Treatment Number: 10 Patient Type: Outpatient Chamber Type: Monoplace Chamber Serial #: [id number] Treatment Protocol: 2.5 ATA with 90 minutes oxygen, with two 5 minute air breaks Treatment Details Compression Rate Down: 2.0 psi / minute De-Compression Rate Up: 2.0 psi / minute A breaks and breathing ir Compress Tx Pressure periods Decompress Decompress Begins Reached (leave unused spaces Begins Ends blank) Chamber Pressure (ATA 1 2.5 2.5 2.5 2.5 2.5 - - 2.5 1 ) Clock Time (24 hr) 10:41 10:52 11:22 11:27 11:57 12:02 - - 12:32 12:43 Treatment Length: 122 (minutes) Treatment Segments: 4 Vital Signs Capillary Blood Glucose Reference Range: 80 - 120 mg / dl HBO Diabetic Blood Glucose Intervention Range: <131 mg/dl or >563 mg/dl Time Vitals Blood Respiratory Capillary Blood Glucose Pulse Action Type: Pulse: Temperature: Taken: Pressure: Rate: Glucose (mg/dl): Meter #: Oximetry (%) Taken: Pre 10:19 132/69 97 18 98 187 2 Post 12:44 140/78 67 18 97.3 156 2 Treatment Response Treatment Toleration: Well Treatment Completion Status: Treatment Completed without Adverse Event Electronic Signature(s) Signed: 07/22/2021 8:58:03 PM By: Haywood Pao EMT Signed: 07/24/2021 9:30:20 AM By: Lenda Kelp  PA-C Entered By: Haywood Pao on 07/22/2021 17:19:48 -------------------------------------------------------------------------------- HBO Safety Checklist Details Patient Name: Date of Service: [name] 07/22/2021 10:00 A M Medical Record Number: [id number] Patient Account Number: [id number] Date of Birth/Sex: Treating RN: July 10, 1961 (60 y.o. [name] Primary Care Navpreet Szczygiel: [name] Other Clinician: Haywood Pao Referring Ivyrose Hashman: Treating Saryah Loper/Extender: Atlee Abide Weeks in Treatment: 6 HBO Safety Checklist Items Safety Checklist Consent Form Signed Patient voided / foley secured and emptied When did you last eato Breakfast Last dose of injectable or oral agent n/a Ostomy pouch emptied and vented if applicable NA All implantable devices assessed, documented and approved NA Intravenous access site secured and place NA Valuables secured Linens and cotton and cotton/polyester blend (less than 51% polyester) Personal oil-based products / skin lotions / body lotions removed Wigs or hairpieces removed NA Smoking or tobacco materials removed NA Books / newspapers / magazines / loose paper removed Cologne, aftershave, perfume and deodorant removed Jewelry removed (may wrap wedding band) Make-up removed NA Hair care products removed Battery operated devices (external) removed Heating patches and chemical warmers removed Titanium eyewear removed NA Nail polish cured greater than 10 hours NA Casting material cured greater than 10 hours Cast applied at last Wound Care Encounter Hearing aids removed NA Loose dentures or partials removed NA Prosthetics have been removed NA Patient demonstrates correct use of air break device (if applicable) Patient concerns have been addressed Patient grounding bracelet on and cord attached to chamber Specifics for Inpatients (complete in addition to above) Medication sheet sent with  patient NA Intravenous medications needed or due during therapy sent with patient NA Drainage tubes (e.g. nasogastric tube or chest tube secured and vented) NA Endotracheal or Tracheotomy tube secured NA Cuff deflated of air and inflated with saline NA Airway suctioned NA Notes Paper  version used prior to treatment. Data entered afterward. Electronic Signature(s) Signed: 07/22/2021 12:11:49 PM By: Haywood Pao EMT Entered By: Haywood Pao on 07/22/2021 11:43:03

## 2021-07-27 ENCOUNTER — Encounter (HOSPITAL_BASED_OUTPATIENT_CLINIC_OR_DEPARTMENT_OTHER): Payer: Medicare Other | Admitting: Internal Medicine

## 2021-07-27 ENCOUNTER — Other Ambulatory Visit: Payer: Self-pay

## 2021-07-27 DIAGNOSIS — E11621 Type 2 diabetes mellitus with foot ulcer: Secondary | ICD-10-CM | POA: Diagnosis not present

## 2021-07-27 DIAGNOSIS — M86372 Chronic multifocal osteomyelitis, left ankle and foot: Secondary | ICD-10-CM

## 2021-07-27 DIAGNOSIS — M14672 Charcot's joint, left ankle and foot: Secondary | ICD-10-CM

## 2021-07-27 DIAGNOSIS — L97528 Non-pressure chronic ulcer of other part of left foot with other specified severity: Secondary | ICD-10-CM | POA: Diagnosis not present

## 2021-07-27 LAB — GLUCOSE, CAPILLARY
Glucose-Capillary: 136 mg/dL — ABNORMAL HIGH (ref 70–99)
Glucose-Capillary: 132 mg/dL — ABNORMAL HIGH (ref 70–99)

## 2021-07-27 NOTE — Progress Notes (Addendum)
KOVEN, BELINSKY ([id number]) Visit Report for 07/27/2021 HBO Details Patient Name: Date of Service: [name], [name] 07/27/2021 10:00 A M Medical Record Number: [id number] Patient Account Number: [id number] Date of Birth/Sex: Treating RN: 06-03-1961 (60 y.o. [name] Primary Care Thaila Bottoms: [name] Other Clinician: Haywood Pao Referring Yer Castello: Treating Reis Goga/Extender: [name] in Treatment: 7 HBO Treatment Course Details Treatment Course Number: 1 Ordering Phoenyx Melka: Baltazar Najjar T Treatments Ordered: otal 40 HBO Treatment Start Date: 07/07/2021 HBO Indication: Chronic Refractory Osteomyelitis to Left,Plantar Foot HBO Treatment Details Treatment Number: 13 Patient Type: Outpatient Chamber Type: Monoplace Chamber Serial #: [id number] Treatment Protocol: 2.5 ATA with 90 minutes oxygen, with two 5 minute air breaks Treatment Details Compression Rate Down: 2.0 psi / minute De-Compression Rate Up: 2.5 psi / minute A breaks and ir Compress Tx Pressure breathing periods Decompress Decompress Begins Reached (leave unused Begins Ends spaces blank) Chamber Pressure (ATA 1 2.5 2.5 2.5 2.5 2.5 - - 2.5 1 ) Clock Time (24 hr) 10:28 10:39 11:09 11:14 - - - - 11:24 11:33 Treatment Length: 65 (minutes) Treatment Segments: 2 Vital Signs Capillary Blood Glucose Reference Range: 80 - 120 mg / dl HBO Diabetic Blood Glucose Intervention Range: <131 mg/dl or >341 mg/dl Time Vitals Blood Respiratory Capillary Blood Glucose Pulse Action Type: Pulse: Temperature: Taken: Pressure: Rate: Glucose (mg/dl): Meter #: Oximetry (%) Taken: Pre 10:03 137/76 109 20 98.4 136 2 Post 11:37 143/76 93 20 98 132 2 Treatment Response Treatment Toleration: Well Adverse Events: 1:Confinement Anxiety Treatment Completion Status: Treatment Completed with Adverse Event Treatment Notes At 1109 Patient reported feeling panicked. Patient was displaying some trembling  in his hands. I had patient take his complete first air break and then spoke to patient about how he was feeling. Patient stated he felt like he was experiencing confinement anxiety. I alerted Ireene Ballowe and patient was taken out of the chamber at a rate of 2.5 psi/min. All vitals after reaching 1 ATA were within their appropriate ranges. I evaluated patient. He denied SOB or Chest pain. He states he started feeling anxiety from being enclosed in the chamber. He was subsequently taken out of the chamber. -Dr. Mikey Bussing Physician HBO Attestation: I certify that I supervised this HBO treatment in accordance with Medicare guidelines. A trained emergency response team is readily available per Yes hospital policies and procedures. Continue HBOT as ordered. Yes Electronic Signature(s) Signed: 07/28/2021 12:41:04 PM By: [name] DO Previous Signature: 07/27/2021 2:14:55 PM Version By: Haywood Pao EMT Previous Signature: 07/27/2021 2:10:32 PM Version By: Haywood Pao EMT Previous Signature: 07/27/2021 2:10:32 PM Version By: Haywood Pao EMT Entered By: [name] on 07/28/2021 12:39:49 -------------------------------------------------------------------------------- HBO Safety Checklist Details Patient Name: Date of Service: [name] 07/27/2021 10:00 A M Medical Record Number: [id number] Patient Account Number: [id number] Date of Birth/Sex: Treating RN: Mar 17, 1961 (60 y.o. [name] Primary Care Shareena Nusz: [name] Other Clinician: Haywood Pao Referring Berda Shelvin: Treating Laurel Harnden/Extender: [name] in Treatment: 7 HBO Safety Checklist Items Safety Checklist Consent Form Signed Patient voided / foley secured and emptied When did you last eato snack this AM Last dose of injectable or oral agent n/a Ostomy pouch emptied and vented if applicable NA All implantable devices assessed, documented and  approved NA Intravenous access site secured and place NA Valuables secured Linens and cotton and cotton/polyester blend (less than 51% polyester) Personal oil-based products / skin lotions / body lotions removed Wigs or hairpieces removed NA Smoking or tobacco  materials removed NA Books / newspapers / magazines / loose paper removed Cologne, aftershave, perfume and deodorant removed Jewelry removed (may wrap wedding band) Make-up removed NA Hair care products removed NA Battery operated devices (external) removed Heating patches and chemical warmers removed Titanium eyewear removed NA Nail polish cured greater than 10 hours NA Casting material cured greater than 10 hours NA Hearing aids removed NA Loose dentures or partials removed NA Prosthetics have been removed NA Patient demonstrates correct use of air break device (if applicable) Patient concerns have been addressed Patient grounding bracelet on and cord attached to chamber Specifics for Inpatients (complete in addition to above) Medication sheet sent with patient NA Intravenous medications needed or due during therapy sent with patient NA Drainage tubes (e.g. nasogastric tube or chest tube secured and vented) NA Endotracheal or Tracheotomy tube secured NA Cuff deflated of air and inflated with saline NA Airway suctioned NA Notes Paper version used prior to treatment. Electronic Signature(s) Signed: 07/27/2021 2:03:10 PM By: Haywood Pao EMT Entered By: Haywood Pao on 07/27/2021 14:03:09

## 2021-07-27 NOTE — Progress Notes (Signed)
[name], [name] ([id number]) Visit Report for 07/24/2021 Chief Complaint Document Details Patient Name: Date of Service: [name], [name] 07/24/2021 9:15 A M Medical Record Number: [id number] Patient Account Number: [id number] Date of Birth/Sex: Treating RN: [name][birth year] (60 y.o. [name] Primary Care Provider: [name] Other Clinician: Referring Provider: Treating Provider/Extender: [name] in Treatment: 7 Information Obtained from: Patient Chief Complaint Bilateral foot diabetic ulcers Electronic Signature(s) Signed: 07/27/2021 9:40:45 AM By: [name] DO Entered By: [name] on 07/24/2021 12:53:30 -------------------------------------------------------------------------------- HPI Details Patient Name: Date of Service: [name] 07/24/2021 9:15 A M Medical Record Number: [id number] Patient Account Number: [id number] Date of Birth/Sex: Treating RN: [name][birth year] (60 y.o. [name] Primary Care Provider: [name] Other Clinician: Referring Provider: Treating Provider/Extender: [name] in Treatment: 7 History of Present Illness HPI Description: 10/31/2019 upon evaluation today patient appears to be doing somewhat poorly in regard to his bilateral plantar feet. He has wounds that he tells me have been present since 2012 intermittently off and on. Most recently this has been open for at least the past 6 months to a year. He has been trying to treat this in different ways using Santyl along with various other dressings including Medihoney and even at one point Xeroform. Nothing really has seem to get this completely closed. He was recently in the hospital for cellulitis of his leg subsequently he did have x-rays as well as MRIs that showed negative for any signs of osteomyelitis in regard to the wounds on his feet. Fortunately there is no signs of systemic infection at this time. No fevers, chills,  nausea, vomiting, or diarrhea. Patient has previously used Darco offloading shoes as far as frontal floaters as well as postop surgical shoes. He has never been in a total contact cast that may be something we need to strongly consider here. Patient's most recent hemoglobin A1c 1 month ago was 5.3 seems to be very well controlled which is great. Subsequently he has seen vascular as well as podiatry. His ABIs are 1.07 on the left and 1.[name] on the right he seems to be doing well he does have chronic venous stasis. 11/07/2019 upon evaluation today patient appears to be doing well with regard to his wounds all things considered. I do not see any severe worsening he still has some callus buildup on the right more than the left he notes that he has been probably more active than he should as far as walking is concerned is just very hard to not be active. He knows he needs to be more careful in this regard however. He is willing to give the cast a try at this point although he notes that he is a little nervous about this just with regard to balance although he will be very careful and obviously if he has any trouble he knows to contact the office and let me know. 1/22; patient is in for his obligatory first total contact cast change. Our intake nurse reported a very large amount of drainage which is spelled out over to the surrounding skin. Has bilateral diabetic foot wounds. He has Charcot feet. We have been using silver alginate on his wounds. 11/14/2019 on evaluation today patient is actually seeming to make good improvement in regard to his bilateral plantar foot wounds. We have been using a cast on the left side and on the right side he has been using dressings he is changing up his own accord. With that  being said he tells me that he is also not walking as much just due to how unsteady he feels. He takes it easy when he does have to walk and when he does not have to walk he is resting. This is  probably help in his right foot as well has the left foot which is actually measuring better. In fact both are measuring better. Overall I am very pleased with how things seem to be progressing. The patient does have some odor on the left foot this does have me concerned about the possibility of infection, and actually probably go ahead and put him on antibiotics today as well as utilizing a continuation of the cast on the left foot I think that will be fine we probably just need to bring him in sooner to change this not last a whole week. 1/29; we brought the patient back today for a total contact cast change on the left out of concern for excessive drainage. We are using drawtex over the wound as the primary dressing 11/21/2019 on evaluation today patient appears to be doing well with regard to his left plantar foot. In fact both foot ulcers actually seem to be doing pretty well. Nonetheless he is having a lot of drainage on the left at this time and again we did obtain a wound culture did show positive for Staph aureus that was reviewed by myself today as well. Nonetheless he is on Bactrim which was shown to be sensitive that should be helping in this regard. Fortunately there is no signs of infection systemically at this point. 2/5; back in clinic today for a total contact cast change apparently secondary to very significant drainage. Still using drawtex 11/28/2019 upon evaluation today patient appears to be doing well with regard to his wounds. The right foot is doing okay as measured about the same in my opinion. The left foot is actually showing signs of significant improvement is measuring smaller there is a lot of hyper granulation likely due to the continued drainage at this point. We did obtain approval for a snap VAC I think that is good to be appropriate for him and will likely help this tremendously underneath the cast. He is definitely in agreement with proceeding with such. 2/12; patient  came in today after his snap VAC lost suction. Brought in to see one of our nurses. The dressing was replaced and then we put the cast back on and rehooked up the snap VAC. Apparently his wound looked very good per our intake nurse. 2/15; again we replaced the cast on Friday. By Saturday the snap VAC and light suction. He called this morning he comes in acutely. The wounds look fine however the VAC is not functioning. We replaced the cast using silver alginate as the primary dressing backed with Kerramax. The snap VAC was not replaced 12/05/2019 upon evaluation today patient appears to be doing better in regard to his left plantar foot ulcer. Fortunately there is no signs of active infection at this time. Unfortunately he is continuing to have issues with the right foot he is really not making any progress here things seem to be somewhat stagnant to be honest. The depth has increased but that is due to me having debrided the wound in the past based on what I am seeing. 12/12/2019 on evaluation today patient appears to be doing more poorly in regard to the left lower extremity. He has some erythema spreading up the side of his foot I am  concerned about infection again at this point. Unfortunately he has been seeing improvement with a total contact cast but I do not think we should put that on today. On his right plantar foot he continues to have significant drainage this is actually measuring deeper I really do not feel like you are making any progress whatsoever. I have prescribed Granix for him unfortunately his insurance apparently was going to cost him a $500 co-pay. 12/19/2019 upon evaluation today patient actually appears to be doing better in regard to both wounds. With that being said he actually did get the reGranix which he had to pay $500 for. With that being said it does look like that he is actually made some improvement based on what I am seeing at this point with the reGranix. Obviously if he  is going to continue this we are going to do something about trying to get him some help in covering the cost. 12/26/2019 on evaluation today patient appears to be doing really much better even compared to last week. Overall the wound seems to be much better even compared to last week and last week was better than the week before. Since has been using the reGranix his symptoms have improved significantly. With that being said the issue right now is simply that this is a very expensive medication for him the first dose cost him $500. Upon inspection patient's wound bed actually is however dramatically improved compared to before he started this 2 weeks ago. 01/02/2020 upon evaluation today patient appears to actually be doing well. He still had a little bit of reGranix left that has been using in small amounts he just been applying it every other day instead of every day in order to make it go longer. Overall we are still seeing excellent improvement he is measuring smaller looking better healthier tissue and everything seems to be pointing to this headed in the right direction. Fortunately there is no evidence of infection either which is also excellent news. He does have his MRI coming up within the next week. 01/09/2020 upon evaluation today patient appears to be doing a little worse today compared to previous week's evaluation. He is actually been out of the reGranix at this point. He has been trying to make the stretch out so he has been changing the dressings on a regular set schedule like he was previous. I think this has made a difference. Fortunately there is no signs of active infection at this time. No fevers, chills, nausea, vomiting, or diarrhea. 01/16/2020 upon evaluation today patient appears to be doing well with regard to his left plantar foot ulcer. The right plantar foot still shows some significant depth at this point. Fortunately there is no signs of active infection at this  time. 4/[name]/2021 upon evaluation today patient appears to be doing about the same in regard to his right plantar foot ulcer there is still some depth here and we had to wait till he actually switched over to his new insurance to get approval for the MRI under his new insurance plan. With that being said he now has switched as of April 1. Fortunately there is no signs of active infection at this time. Overall in regard to his left foot ulcer this seems to be doing much better and I am actually very pleased with how things are going. With that being said it is not quite as much progress as we were seeing with the reGranix but at the same time he has had  trouble getting this apparently there is been some hindrance here. I Ernie Hew try to actually send this to [facility] that was recommended by the drug rep to me. 02/13/20 upon evaluation today patient appears to be doing better in regard to his left foot ulcer this is great news. Unfortunately the right foot ulcer is not really significantly better at this time. There is no signs of active infection systemically though he did have his MRI which showed unfortunately he does have infection noted including an abscess in the foot. There is also marrow changes noted which are consistent with osteomyelitis based on the radiology review and interpretation. Unfortunately considering that the wound is not really making the progress that we will he would like to be seen I think that this is an indication that he may need some further referral both infectious disease as well as potentially to podiatrist to see if there is anything that can be done to help with the situation that were dealing with here. The left foot again is doing great. READMISSION 06/04/2021 This is a 60 year old man who was in the clinic in 2021 followed by Allen Derry for areas on the right and left foot. He developed a left foot infection and was referred to ID. He left the clinic in a  nonhealed state and was followed for a period of [facility] Marylene Land. Apparently things really deteriorated in early July when he was admitted to hospital from 04/27/2021 through 05/06/2021 with sepsis secondary to a left foot infection. His blood cultures were negative. An MRI suggested fifth metatarsal osteomyelitis a left ankle septic joint. He was treated with vancomycin and ertapenem which he is still taking and may just about be finishing. He was seen by orthopedics and the patient adamantly refused to BKA. As far as I can tell he did not have the ankle aspirated I am not exactly sure what the issue was here. Since he has been discharged she is at [facility] health care for rehabilitation. He was last seen by Dr. Algis Liming on 05/20/2021 he noted osteo of the tibial talar bone cuboid and fifth metatarsal which is even more extensive than what was suggested by the MRI. He is apparently going for a consultation with orthopedic surgery in [place] sometime next week. I received a call about this man 2 weeks ago from Dr. Allyson Sabal who follows him for the possibility of PAD. ABIs I think done in the office showed a ABI on the right of 1.05 at the PTA and 0.99 at the PTA on the left. He had a DVT rule out in the left leg that was negative for the DVT. Past medical history is extensive and includes diastolic heart failure, right first metatarsal head ulcer in 2021, excision of the right second ray by Dr. Marylene Land on 03/13/2020, hypertension, hypothyroidism. Left total hip replacement, right total knee replacement, carpal tunnel syndrome, obstructive sleep apnea alcohol abuse with cirrhosis although the patient denies current alcohol intake. The patient does not think he is a diabetic however looking through [place] link I see 2 HgbAic's of this year that were greater than equal to 6.5 which by definition makes him diabetic. Nevertheless he is not on any treatment and does not check his blood  sugars. The patient is now back home out of the nursing home. Saw Dr. Algis Liming last week he was taken off vancomycin and ertapenem on August 22 and now is on doxycycline on Augmentin. He also saw Dr. Weston Anna  who is his orthopedic surgeon in [place] he recommended a [employer]	[place]. He has been using Medihoney. 9/6I have been having trouble getting hyperbarics approved through our prior authorization process. Even though he had a limb threatening infection in the left foot and probably the left ankle there glitches in how some of the reports are worded also some of the consultants. In any case I am going to repeat his sedimentation rate and C-reactive protein. I am generally not in favor of doing things like this as they really do not alter the plan of care from my point of view however I am going to need to demonstrate that these remain high in order to get this through forhyperbaric treatment for chronic refractory osteomyelitis 9/13; following this man for a wound on his left plantar foot in the setting of type 2 diabetes and Charcot deformity. He has underlying chronic refractory osteomyelitis. Follow-up sedimentation rate and C-reactive protein were both elevated but the C-reactive protein was down to 1.4, sedimentation rate at 70. Sedimentation rate was only slightly down from previous at 85. His wound is measuring slightly smaller. 9/20; patient started hyperbaric oxygen therapy today and tolerated treatment well. This is for the underlying osteomyelitis. He remains on antibiotics but thinks he is getting close to finishing. The wound on the plantar aspect of his foot is the other issue we are following here. He is using Medihoney The patient has a Charcot foot in the setting of type 2 diabetes. He is going to need a total contact cast although his partner was away this week and we elected to delay this till next week 9/27; patient still tolerating hyperbaric oxygen well. Wound looked generally  healthy not much depth under illumination still 100% covered in fibrinous debris. Raised callused edges around the wound he was prepared for a total contact cast. We have been using Medihoney 9/30; patient is back for his first obligatory total contact cast change. We are using Hydrofera Blue. Noted by our intake nurse to have a lot of drainage or at least a moderate amount of drainage. I am not sure I was previously aware of this 10/4; patient arrives today with a lot of drainage under the cast. When he had it changed last Friday there was also a similar amount of drainage. Her intake nurse says that they tried a wound VAC on him perhaps while I was on vacation in August under the cast but that did not work. In my experience that has not been unusual. We have been using Hydrofera Blue with all the secondary absorbers. The drainage today went right through all of our dressings. The patient is concerned about his foot being in a cast without much drainage. He is tolerating HBO well. There has been improvements in the wound in the mid part of his foot in the setting of a Charcot deformity 10/7; patient presents for cast change. He has no issues or complaints today. He denies signs of infection. Electronic Signature(s) Signed: 07/27/2021 9:40:45 AM By: [name] DO Entered By: [name] on 07/24/2021 12:53:49 -------------------------------------------------------------------------------- Physical Exam Details Patient Name: Date of Service: [name], [name] 07/24/2021 9:15 A M Medical Record Number: [id number] Patient Account Number: [id number] Date of Birth/Sex: Treating RN: [name], [name] (60 y.o. [name] Primary Care Provider: [name] Other Clinician: Referring Provider: Treating Provider/Extender: Morley Kos Weeks in Treatment: 7 Constitutional respirations regular, non-labored and within target range for patient.Marland Kitchen Psychiatric pleasant and  cooperative. Notes Open wound to the  plantar aspect of the left foot with granulation tissue present. No obvious signs of infection. Electronic Signature(s) Signed: 07/27/2021 9:40:45 AM By: [name] DO Entered By: [name] on 07/24/2021 12:54:31 -------------------------------------------------------------------------------- Physician Orders Details Patient Name: Date of Service: [name], [name] 07/24/2021 9:15 A M Medical Record Number: [id number] Patient Account Number: [id number] Date of Birth/Sex: Treating RN: [name] (60 y.o. [name] Primary Care Provider: [name] Other Clinician: Referring Provider: Treating Provider/Extender: [name] in Treatment: 7 Verbal / Phone Orders: No Diagnosis Coding ICD-10 Coding Code Description E11.621 Type 2 diabetes mellitus with foot ulcer M14.672 Charcot's joint, left ankle and foot L97.528 Non-pressure chronic ulcer of other part of left foot with other specified severity M86.372 Chronic multifocal osteomyelitis, left ankle and foot Follow-up Appointments ppointment in 1 week. - Tuesday Dr. Leanord Hawking Return A Other: - MD visit Friday for cast change Bathing/ Shower/ Hygiene May shower and wash wound with soap and water. - prior to dressing change Edema Control - Lymphedema / SCD / Other Elevate legs to the level of the heart or above for 30 minutes daily and/or when sitting, a frequency of: - throughout the day Avoid standing for long periods of time. Exercise regularly Moisturize legs daily. Off-Loading Total Contact Cast to Left Lower Extremity - Size 4 Additional Orders / Instructions Follow Nutritious Diet Hyperbaric Oxygen Therapy Wound #3 Left,Plantar Foot Evaluate for HBO Therapy - Starts 07/07/21 Indication: - Chronic Refractory Osteomyelitis 2.5 ATA for 90 Minutes with 2 Five (5) Minute A Breaks ir Total Number of Treatments: - 40 One treatments per day (delivered  Monday through Friday unless otherwise specified in Special Instructions below): Finger stick Blood Glucose Pre- and Post- HBOT Treatment. Follow Hyperbaric Oxygen Glycemia Protocol Afrin (Oxymetazoline HCL) 0.05% nasal spray - 1 spray in both nostrils daily as needed prior to HBO treatment for difficulty clearing ears Wound Treatment Wound #3 - Foot Wound Laterality: Plantar, Left Cleanser: Soap and Water 2 x Per Week Discharge Instructions: May shower and wash wound with dial antibacterial soap and water prior to dressing change. Cleanser: Wound Cleanser 2 x Per Week Discharge Instructions: Cleanse the wound with wound cleanser prior to applying a clean dressing using gauze sponges, not tissue or cotton balls. Peri-Wound Care: Zinc Oxide Ointment 30g tube 2 x Per Week Discharge Instructions: Apply Zinc Oxide to periwound with each dressing change Prim Dressing: Hydrofera Blue Ready Foam, 2.5 x2.5 in 2 x Per Week ary Discharge Instructions: Apply to wound bed as instructed Secondary Dressing: ABD Pad, 5x9 2 x Per Week Discharge Instructions: Apply over primary dressing as directed. Secondary Dressing: Zetuvit Plus 4x8 in 2 x Per Week Discharge Instructions: Apply over primary dressing as directed. Secured With: [employer], 4.5x3.1 (in/yd) 2 x Per Week Discharge Instructions: Secure with Kerlix as directed. Secured With: 72M Medipore H Soft Cloth Surgical Tape, 2x2 (in/yd) 2 x Per Week Discharge Instructions: Secure dressing with tape as directed. GLYCEMIA INTERVENTIONS PROTOCOL PRE-HBO GLYCEMIA INTERVENTIONS ACTION INTERVENTION Obtain pre-HBO capillary blood glucose (ensure 1 physician order is in chart). A. Notify HBO physician and await physician orders. 2 If result is 70 mg/dl or below: B. If the result meets the hospital definition of a critical result, follow hospital policy. A. Give patient an 8 ounce Glucerna Shake, an 8 ounce Ensure, or 8 ounces of  a Glucerna/Ensure equivalent dietary supplement*. B. Wait 30 minutes. If result is 71 mg/dl to 614 mg/dl: C. Retest patients capillary blood glucose (CBG).  D. If result greater than or equal to 110 mg/dl, proceed with HBO. If result less than 110 mg/dl, notify HBO physician and consider holding HBO. If result is 131 mg/dl to 378 mg/dl: A. Proceed with HBO. A. Notify HBO physician and await physician orders. B. It is recommended to hold HBO and do B. It is recommended to hold HBO and do If result is 250 mg/dl or greater: blood/urine ketone testing. C. If the result meets the hospital definition of a critical result, follow hospital policy. POST-HBO GLYCEMIA INTERVENTIONS ACTION INTERVENTION Obtain post HBO capillary blood glucose (ensure 1 physician order is in chart). A. Notify HBO physician and await physician orders. 2 If result is 70 mg/dl or below: B. If the result meets the hospital definition of a critical result, follow hospital policy. A. Give patient an 8 ounce Glucerna Shake, an 8 ounce Ensure, or 8 ounces of a Glucerna/Ensure equivalent dietary supplement*. B. Wait 15 minutes for symptoms of If result is 71 mg/dl to 588 mg/dl: hypoglycemia (i.e. nervousness, anxiety, sweating, chills, clamminess, irritability, confusion, tachycardia or dizziness). C. If patient asymptomatic, discharge patient. If patient symptomatic, repeat capillary blood glucose (CBG) and notify HBO physician. If result is 101 mg/dl to 502 mg/dl: A. Discharge patient. A. Notify HBO physician and await physician orders. B. It is recommended to do blood/urine ketone If result is 250 mg/dl or greater: testing. C. If the result meets the hospital definition of a critical result, follow hospital policy. *Juice or candies are NOT equivalent products. If patient refuses the Glucerna or Ensure, please consult the hospital dietitian for an appropriate substitute. Electronic  Signature(s) Signed: 07/27/2021 9:40:45 AM By: [name] DO Entered By: [name] on 07/24/2021 12:54:47 -------------------------------------------------------------------------------- Problem List Details Patient Name: Date of Service: [name], [name] 07/24/2021 9:15 A M Medical Record Number: [id number] Patient Account Number: [id number] Date of Birth/Sex: Treating RN: [birth year] (60 y.o. [name] Primary Care Provider: [name] Other Clinician: Referring Provider: Treating Provider/Extender: [name] in Treatment: 7 Active Problems ICD-10 Encounter Code Description Active Date MDM Diagnosis E11.621 Type 2 diabetes mellitus with foot ulcer 06/04/2021 No Yes M14.672 Charcot's joint, left ankle and foot 06/04/2021 No Yes L97.528 Non-pressure chronic ulcer of other part of left foot with other specified 06/04/2021 No Yes severity M86.372 Chronic multifocal osteomyelitis, left ankle and foot 06/04/2021 No Yes Inactive Problems Resolved Problems Electronic Signature(s) Signed: 07/27/2021 9:40:45 AM By: [name] DO Entered By: [name] on 07/24/2021 12:52:44 -------------------------------------------------------------------------------- Progress Note Details Patient Name: Date of Service: [name] 07/24/2021 9:15 A M Medical Record Number: [id number] Patient Account Number: [id number] Date of Birth/Sex: Treating RN: [name]07/[name] (60 y.o. [name] Primary Care Provider: [name] Other Clinician: Referring Provider: Treating Provider/Extender: [name] in Treatment: 7 Subjective Chief Complaint Information obtained from Patient Bilateral foot diabetic ulcers History of Present Illness (HPI) 10/31/2019 upon evaluation today patient appears to be doing somewhat poorly in regard to his bilateral plantar feet. He has wounds that he tells me have been present since 2012  intermittently off and on. Most recently this has been open for at least the past 6 months to a year. He has been trying to treat this in different ways using Santyl along with various other dressings including Medihoney and even at one point Xeroform. Nothing really has seem to get this completely closed. He was recently in the hospital for cellulitis of his leg subsequently he did have x-rays  as well as MRIs that showed negative for any signs of osteomyelitis in regard to the wounds on his feet. Fortunately there is no signs of systemic infection at this time. No fevers, chills, nausea, vomiting, or diarrhea. Patient has previously used Darco offloading shoes as far as frontal floaters as well as postop surgical shoes. He has never been in a total contact cast that may be something we need to strongly consider here. Patient's most recent hemoglobin A1c 1 month ago was 5.3 seems to be very well controlled which is great. Subsequently he has seen vascular as well as podiatry. His ABIs are 1.07 on the left and 1.[name] on the right he seems to be doing well he does have chronic venous stasis. 11/07/2019 upon evaluation today patient appears to be doing well with regard to his wounds all things considered. I do not see any severe worsening he still has some callus buildup on the right more than the left he notes that he has been probably more active than he should as far as walking is concerned is just very hard to not be active. He knows he needs to be more careful in this regard however. He is willing to give the cast a try at this point although he notes that he is a little nervous about this just with regard to balance although he will be very careful and obviously if he has any trouble he knows to contact the office and let me know. 1/22; patient is in for his obligatory first total contact cast change. Our intake nurse reported a very large amount of drainage which is spelled out over to the surrounding  skin. Has bilateral diabetic foot wounds. He has Charcot feet. We have been using silver alginate on his wounds. 11/14/2019 on evaluation today patient is actually seeming to make good improvement in regard to his bilateral plantar foot wounds. We have been using a cast on the left side and on the right side he has been using dressings he is changing up his own accord. With that being said he tells me that he is also not walking as much just due to how unsteady he feels. He takes it easy when he does have to walk and when he does not have to walk he is resting. This is probably help in his right foot as well has the left foot which is actually measuring better. In fact both are measuring better. Overall I am very pleased with how things seem to be progressing. The patient does have some odor on the left foot this does have me concerned about the possibility of infection, and actually probably go ahead and put him on antibiotics today as well as utilizing a continuation of the cast on the left foot I think that will be fine we probably just need to bring him in sooner to change this not last a whole week. 1/29; we brought the patient back today for a total contact cast change on the left out of concern for excessive drainage. We are using drawtex over the wound as the primary dressing 11/21/2019 on evaluation today patient appears to be doing well with regard to his left plantar foot. In fact both foot ulcers actually seem to be doing pretty well. Nonetheless he is having a lot of drainage on the left at this time and again we did obtain a wound culture did show positive for Staph aureus that was reviewed by myself today as well. Nonetheless he is on  Bactrim which was shown to be sensitive that should be helping in this regard. Fortunately there is no signs of infection systemically at this point. 2/5; back in clinic today for a total contact cast change apparently secondary to very significant drainage.  Still using drawtex 11/28/2019 upon evaluation today patient appears to be doing well with regard to his wounds. The right foot is doing okay as measured about the same in my opinion. The left foot is actually showing signs of significant improvement is measuring smaller there is a lot of hyper granulation likely due to the continued drainage at this point. We did obtain approval for a snap VAC I think that is good to be appropriate for him and will likely help this tremendously underneath the cast. He is definitely in agreement with proceeding with such. 2/12; patient came in today after his snap VAC lost suction. Brought in to see one of our nurses. The dressing was replaced and then we put the cast back on and rehooked up the snap VAC. Apparently his wound looked very good per our intake nurse. 2/15; again we replaced the cast on Friday. By Saturday the snap VAC and light suction. He called this morning he comes in acutely. The wounds look fine however the VAC is not functioning. We replaced the cast using silver alginate as the primary dressing backed with Kerramax. The snap VAC was not replaced 12/05/2019 upon evaluation today patient appears to be doing better in regard to his left plantar foot ulcer. Fortunately there is no signs of active infection at this time. Unfortunately he is continuing to have issues with the right foot he is really not making any progress here things seem to be somewhat stagnant to be honest. The depth has increased but that is due to me having debrided the wound in the past based on what I am seeing. 12/12/2019 on evaluation today patient appears to be doing more poorly in regard to the left lower extremity. He has some erythema spreading up the side of his foot I am concerned about infection again at this point. Unfortunately he has been seeing improvement with a total contact cast but I do not think we should put that on today. On his right plantar foot he continues to  have significant drainage this is actually measuring deeper I really do not feel like you are making any progress whatsoever. I have prescribed Granix for him unfortunately his insurance apparently was going to cost him a $500 co-pay. 12/19/2019 upon evaluation today patient actually appears to be doing better in regard to both wounds. With that being said he actually did get the reGranix which he had to pay $500 for. With that being said it does look like that he is actually made some improvement based on what I am seeing at this point with the reGranix. Obviously if he is going to continue this we are going to do something about trying to get him some help in covering the cost. 12/26/2019 on evaluation today patient appears to be doing really much better even compared to last week. Overall the wound seems to be much better even compared to last week and last week was better than the week before. Since has been using the reGranix his symptoms have improved significantly. With that being said the issue right now is simply that this is a very expensive medication for him the first dose cost him $500. Upon inspection patient's wound bed actually is however dramatically improved compared to  before he started this 2 weeks ago. 01/02/2020 upon evaluation today patient appears to actually be doing well. He still had a little bit of reGranix left that has been using in small amounts he just been applying it every other day instead of every day in order to make it go longer. Overall we are still seeing excellent improvement he is measuring smaller looking better healthier tissue and everything seems to be pointing to this headed in the right direction. Fortunately there is no evidence of infection either which is also excellent news. He does have his MRI coming up within the next week. 01/09/2020 upon evaluation today patient appears to be doing a little worse today compared to previous week's evaluation. He is  actually been out of the reGranix at this point. He has been trying to make the stretch out so he has been changing the dressings on a regular set schedule like he was previous. I think this has made a difference. Fortunately there is no signs of active infection at this time. No fevers, chills, nausea, vomiting, or diarrhea. 01/16/2020 upon evaluation today patient appears to be doing well with regard to his left plantar foot ulcer. The right plantar foot still shows some significant depth at this point. Fortunately there is no signs of active infection at this time. 4/[name]/2021 upon evaluation today patient appears to be doing about the same in regard to his right plantar foot ulcer there is still some depth here and we had to wait till he actually switched over to his new insurance to get approval for the MRI under his new insurance plan. With that being said he now has switched as of April 1. Fortunately there is no signs of active infection at this time. Overall in regard to his left foot ulcer this seems to be doing much better and I am actually very pleased with how things are going. With that being said it is not quite as much progress as we were seeing with the reGranix but at the same time he has had trouble getting this apparently there is been some hindrance here. I Ernie Hew try to actually send this to [facility] that was recommended by the drug rep to me. 02/13/20 upon evaluation today patient appears to be doing better in regard to his left foot ulcer this is great news. Unfortunately the right foot ulcer is not really significantly better at this time. There is no signs of active infection systemically though he did have his MRI which showed unfortunately he does have infection noted including an abscess in the foot. There is also marrow changes noted which are consistent with osteomyelitis based on the radiology review and interpretation. Unfortunately considering that the wound is not  really making the progress that we will he would like to be seen I think that this is an indication that he may need some further referral both infectious disease as well as potentially to podiatrist to see if there is anything that can be done to help with the situation that were dealing with here. The left foot again is doing great. READMISSION 06/04/2021 This is a 60 year old man who was in the clinic in 2021 followed by Allen Derry for areas on the right and left foot. He developed a left foot infection and was referred to ID. He left the clinic in a nonhealed state and was followed for a period of [facility] Marylene Land. Apparently things really deteriorated in early July when he  was admitted to hospital from 04/27/2021 through 05/06/2021 with sepsis secondary to a left foot infection. His blood cultures were negative. An MRI suggested fifth metatarsal osteomyelitis a left ankle septic joint. He was treated with vancomycin and ertapenem which he is still taking and may just about be finishing. He was seen by orthopedics and the patient adamantly refused to BKA. As far as I can tell he did not have the ankle aspirated I am not exactly sure what the issue was here. Since he has been discharged she is at [facility] health care for rehabilitation. He was last seen by Dr. Algis Liming on 05/20/2021 he noted osteo of the tibial talar bone cuboid and fifth metatarsal which is even more extensive than what was suggested by the MRI. He is apparently going for a consultation with orthopedic surgery in [place] sometime next week. I received a call about this man 2 weeks ago from Dr. Allyson Sabal who follows him for the possibility of PAD. ABIs I think done in the office showed a ABI on the right of 1.05 at the PTA and 0.99 at the PTA on the left. He had a DVT rule out in the left leg that was negative for the DVT. Past medical history is extensive and includes diastolic heart failure, right first metatarsal  head ulcer in 2021, excision of the right second ray by Dr. Marylene Land on 03/13/2020, hypertension, hypothyroidism. Left total hip replacement, right total knee replacement, carpal tunnel syndrome, obstructive sleep apnea alcohol abuse with cirrhosis although the patient denies current alcohol intake. The patient does not think he is a diabetic however looking through [facility] I see 2 HgbAic's of this year that were greater than equal to 6.5 which by definition makes him diabetic. Nevertheless he is not on any treatment and does not check his blood sugars. The patient is now back home out of the nursing home. Saw Dr. Algis Liming last week he was taken off vancomycin and ertapenem on August 22 and now is on doxycycline on Augmentin. He also saw Dr. Weston Anna who is his orthopedic surgeon in [place] he recommended a [employer]	[place]. He has been using Medihoney. 9/6I have been having trouble getting hyperbarics approved through our prior authorization process. Even though he had a limb threatening infection in the left foot and probably the left ankle there glitches in how some of the reports are worded also some of the consultants. In any case I am going to repeat his sedimentation rate and C-reactive protein. I am generally not in favor of doing things like this as they really do not alter the plan of care from my point of view however I am going to need to demonstrate that these remain high in order to get this through forhyperbaric treatment for chronic refractory osteomyelitis 9/13; following this man for a wound on his left plantar foot in the setting of type 2 diabetes and Charcot deformity. He has underlying chronic refractory osteomyelitis. Follow-up sedimentation rate and C-reactive protein were both elevated but the C-reactive protein was down to 1.4, sedimentation rate at 70. Sedimentation rate was only slightly down from previous at 85. His wound is measuring slightly smaller. 9/20; patient started  hyperbaric oxygen therapy today and tolerated treatment well. This is for the underlying osteomyelitis. He remains on antibiotics but thinks he is getting close to finishing. The wound on the plantar aspect of his foot is the other issue we are following here. He is using Medihoney The patient has a Charcot foot  in the setting of type 2 diabetes. He is going to need a total contact cast although his partner was away this week and we elected to delay this till next week 9/27; patient still tolerating hyperbaric oxygen well. Wound looked generally healthy not much depth under illumination still 100% covered in fibrinous debris. Raised callused edges around the wound he was prepared for a total contact cast. We have been using Medihoney 9/30; patient is back for his first obligatory total contact cast change. We are using Hydrofera Blue. Noted by our intake nurse to have a lot of drainage or at least a moderate amount of drainage. I am not sure I was previously aware of this 10/4; patient arrives today with a lot of drainage under the cast. When he had it changed last Friday there was also a similar amount of drainage. Her intake nurse says that they tried a wound VAC on him perhaps while I was on vacation in August under the cast but that did not work. In my experience that has not been unusual. We have been using Hydrofera Blue with all the secondary absorbers. The drainage today went right through all of our dressings. The patient is concerned about his foot being in a cast without much drainage. He is tolerating HBO well. There has been improvements in the wound in the mid part of his foot in the setting of a Charcot deformity 10/7; patient presents for cast change. He has no issues or complaints today. He denies signs of infection. Patient History Information obtained from Patient. Family History Unknown History. Social History Former smoker, Marital Status - Divorced, Alcohol Use - Rarely -  history of alcoholism, Drug Use - Current History - Marijuana, Caffeine Use - Moderate. Medical History Hematologic/Lymphatic Patient has history of Anemia Respiratory Patient has history of Sleep Apnea Denies history of Asthma, Chronic Obstructive Pulmonary Disease (COPD) Cardiovascular Patient has history of Congestive Heart Failure, Hypertension, Peripheral Venous Disease Gastrointestinal Patient has history of Cirrhosis Denies history of Crohnoos Endocrine Patient has history of Type II Diabetes Musculoskeletal Patient has history of Osteoarthritis Neurologic Patient has history of Neuropathy Medical A Surgical History Notes nd Gastrointestinal Hx ETOH abuse Endocrine Hypothyroidism Objective Constitutional respirations regular, non-labored and within target range for patient.. Vitals Time Taken: 10:01 AM, Height: 73 in, Weight: 270 lbs, BMI: 35.6, Temperature: 97.7 F, Pulse: 106 bpm, Respiratory Rate: 18 breaths/min, Blood Pressure: 139/83 mmHg. Psychiatric pleasant and cooperative. General Notes: Open wound to the plantar aspect of the left foot with granulation tissue present. No obvious signs of infection. Integumentary (Hair, Skin) Wound #3 status is Open. Original cause of wound was Gradually Appeared. The date acquired was: 03/18/2021. The wound has been in treatment 7 weeks. The wound is located on the Left,Plantar Foot. The wound measures 1.5cm length x 2.5cm width x 0.2cm depth; 2.945cm^2 area and 0.589cm^3 volume. There is a medium amount of serosanguineous drainage noted. Assessment Active Problems ICD-10 Type 2 diabetes mellitus with foot ulcer Charcot's joint, left ankle and foot Non-pressure chronic ulcer of other part of left foot with other specified severity Chronic multifocal osteomyelitis, left ankle and foot Patient is currently completing HBO for chronic refractory osteomyelitis. Patient's wound is stable. Patient has no complaints. No signs of  infection. Cast was placed today in office. Follow-up next week. Procedures Wound #3 Pre-procedure diagnosis of Wound #3 is a Diabetic Wound/Ulcer of the Lower Extremity located on the Left,Plantar Foot . There was a T Contact Cast otal Procedure by  [name], DO. Post procedure Diagnosis Wound #3: Same as Pre-Procedure Plan Follow-up Appointments: Return Appointment in 1 week. - Tuesday Dr. Leanord Hawking Other: - MD visit Friday for cast change Bathing/ Shower/ Hygiene: May shower and wash wound with soap and water. - prior to dressing change Edema Control - Lymphedema / SCD / Other: Elevate legs to the level of the heart or above for 30 minutes daily and/or when sitting, a frequency of: - throughout the day Avoid standing for long periods of time. Exercise regularly Moisturize legs daily. Off-Loading: T Contact Cast to Left Lower Extremity - Size 4 otal Additional Orders / Instructions: Follow Nutritious Diet Hyperbaric Oxygen Therapy: Wound #3 Left,Plantar Foot: Evaluate for HBO Therapy - Starts 07/07/21 Indication: - Chronic Refractory Osteomyelitis 2.5 ATA for 90 Minutes with 2 Five (5) Minute Air Breaks T Number of Treatments: - 40 otal One treatments per day (delivered Monday through Friday unless otherwise specified in Special Instructions below): Finger stick Blood Glucose Pre- and Post- HBOT Treatment. Follow Hyperbaric Oxygen Glycemia Protocol Afrin (Oxymetazoline HCL) 0.05% nasal spray - 1 spray in both nostrils daily as needed prior to HBO treatment for difficulty clearing ears WOUND #3: - Foot Wound Laterality: Plantar, Left Cleanser: Soap and Water 2 x Per Week/ Discharge Instructions: May shower and wash wound with dial antibacterial soap and water prior to dressing change. Cleanser: Wound Cleanser 2 x Per Week/ Discharge Instructions: Cleanse the wound with wound cleanser prior to applying a clean dressing using gauze sponges, not tissue or cotton  balls. Peri-Wound Care: Zinc Oxide Ointment 30g tube 2 x Per Week/ Discharge Instructions: Apply Zinc Oxide to periwound with each dressing change Prim Dressing: Hydrofera Blue Ready Foam, 2.5 x2.5 in 2 x Per Week/ ary Discharge Instructions: Apply to wound bed as instructed Secondary Dressing: ABD Pad, 5x9 2 x Per Week/ Discharge Instructions: Apply over primary dressing as directed. Secondary Dressing: Zetuvit Plus 4x8 in 2 x Per Week/ Discharge Instructions: Apply over primary dressing as directed. Secured With: [employer], 4.5x3.1 (in/yd) 2 x Per Week/ Discharge Instructions: Secure with Kerlix as directed. Secured With: 71M Medipore H Soft Cloth Surgical T ape, 2x2 (in/yd) 2 x Per Week/ Discharge Instructions: Secure dressing with tape as directed. 1. Hydrofera Blue under cast, cast change 2. Follow-up next week 3. Continue HBO Electronic Signature(s) Signed: 07/27/2021 9:40:45 AM By: [name] DO Entered By: [name] on 07/27/2021 09:37:[name] -------------------------------------------------------------------------------- HxROS Details Patient Name: Date of Service: [name] 07/24/2021 9:15 A M Medical Record Number: [id number] Patient Account Number: [id number] Date of Birth/Sex: Treating RN: [birth year], [name] (60 y.o. [name] Primary Care Provider: [name] Other Clinician: Referring Provider: Treating Provider/Extender: [name] in Treatment: 7 Information Obtained From Patient Hematologic/Lymphatic Medical History: Positive for: Anemia Respiratory Medical History: Positive for: Sleep Apnea Negative for: Asthma; Chronic Obstructive Pulmonary Disease (COPD) Cardiovascular Medical History: Positive for: Congestive Heart Failure; Hypertension; Peripheral Venous Disease Gastrointestinal Medical History: Positive for: Cirrhosis Negative for: Crohns Past Medical History Notes: Hx ETOH  abuse Endocrine Medical History: Positive for: Type II Diabetes Past Medical History Notes: Hypothyroidism Time with diabetes: 2017 Treated with: Diet Blood sugar tested every day: No Musculoskeletal Medical History: Positive for: Osteoarthritis Neurologic Medical History: Positive for: Neuropathy Immunizations Pneumococcal Vaccine: Received Pneumococcal Vaccination: No Implantable Devices None Family and Social History Unknown History: Yes; Former smoker; Marital Status - Divorced; Alcohol Use: Rarely - history of alcoholism; Drug Use: Current History - Marijuana; Caffeine Use: Moderate;  Financial Concerns: No; Food, Clothing or Shelter Needs: No; Support System Lacking: No; Transportation Concerns: No Electronic Signature(s) Signed: 07/27/2021 9:40:45 AM By: [name] DO Signed: 07/27/2021 5:11:49 PM By: [name] Entered By: [name] on 07/24/2021 12:53:56 -------------------------------------------------------------------------------- Total Contact Cast Details Patient Name: Date of Service: [name], [name] 07/24/2021 9:15 A M Medical Record Number: [id number] Patient Account Number: [id number] Date of Birth/Sex: Treating RN: [name] (60 y.o. [name] Primary Care Provider: [name] Other Clinician: Referring Provider: Treating Provider/Extender: [name] in Treatment: 7 T Contact Cast Applied for Wound Assessment: otal Wound #3 Left,Plantar Foot Performed By: Physician [name], DO Post Procedure Diagnosis Same as Pre-procedure Electronic Signature(s) Signed: 07/27/2021 9:40:45 AM By: [name] DO Signed: 07/27/2021 4:58:30 PM By: Zandra Abts RN, BSN Entered By: Zandra Abts on 07/24/2021 12:19:30 -------------------------------------------------------------------------------- SuperBill Details Patient Name: Date of Service: [name], [name] 07/24/2021 Medical Record Number:  [id number] Patient Account Number: [id number] Date of Birth/Sex: Treating RN: [name] (60 y.o. [name] Primary Care Provider: [name] Other Clinician: Referring Provider: Treating Provider/Extender: Morley Kos Weeks in Treatment: 7 Diagnosis Coding ICD-10 Codes Code Description E11.621 Type 2 diabetes mellitus with foot ulcer M14.672 Charcot's joint, left ankle and foot L97.528 Non-pressure chronic ulcer of other part of left foot with other specified severity M86.372 Chronic multifocal osteomyelitis, left ankle and foot Facility Procedures CPT4 Code: 78295621 Description: 29445 - APPLY TOTAL CONTACT LEG CAST ICD-10 Diagnosis Description E11.621 Type 2 diabetes mellitus with foot ulcer L97.528 Non-pressure chronic ulcer of other part of left foot with other specified sever Modifier: ity Quantity: 1 Physician Procedures : CPT4 Code Description Modifier [id number] 29445 - WC PHYS APPLY TOTAL CONTACT CAST ICD-10 Diagnosis Description E11.621 Type 2 diabetes mellitus with foot ulcer L97.528 Non-pressure chronic ulcer of other part of left foot with other specified severity Quantity: 1 Electronic Signature(s) Signed: 07/27/2021 9:40:45 AM By: [name] DO Entered By: [name] on 07/27/2021 09:39:45

## 2021-07-27 NOTE — Progress Notes (Addendum)
[name] ([id number]) Visit Report for 07/27/2021 Arrival Information Details Patient Name: Date of Service: [name] 07/27/2021 10:00 A M Medical Record Number: [id number] Patient Account Number: [id number] Date of Birth/Sex: Treating RN: 1961/04/18 (60 y.o. [name] Primary Care [name]: [name] Other Clinician: Haywood Pao Referring Caylyn Tedeschi: Treating Larue Drawdy/Extender: [name] in Treatment: 7 Visit Information History Since Last Visit All ordered tests and consults were completed: Yes Patient Arrived: [name] Added or deleted any medications: No Arrival Time: 09:57 Any new allergies or adverse reactions: No Accompanied By: self Had a fall or experienced change in No Transfer Assistance: None activities of daily living that may affect Patient Identification Verified: Yes risk of falls: Secondary Verification Process Completed: Yes Signs or symptoms of abuse/neglect since last visito No Patient Requires Transmission-Based Precautions: No Hospitalized since last visit: No Patient Has Alerts: Yes Implantable device outside of the clinic excluding No cellular tissue based products placed in the center since last visit: Pain Present Now: No Electronic Signature(s) Signed: 07/27/2021 1:55:33 PM By: Haywood Pao EMT Entered By: Haywood Pao on 07/27/2021 13:55:33 -------------------------------------------------------------------------------- Encounter Discharge Information Details Patient Name: Date of Service: [name] 07/27/2021 10:00 A M Medical Record Number: [id number] Patient Account Number: [id number] Date of Birth/Sex: Treating RN: Aug 13, 1961 (60 y.o. [name] Primary Care Graiden Henes: [name] Other Clinician: Haywood Pao Referring Cosette Prindle: Treating Darris Carachure/Extender: [name] in Treatment: 7 Encounter Discharge Information Items Discharge  Condition: Stable Ambulatory Status: Walker Discharge Destination: Home Transportation: Private Auto Accompanied By: self Schedule Follow-up Appointment: No Clinical Summary of Care: Electronic Signature(s) Signed: 07/27/2021 2:12:31 PM By: Haywood Pao EMT Entered By: Haywood Pao on 07/27/2021 14:12:31 -------------------------------------------------------------------------------- Vitals Details Patient Name: Date of Service: [name] 07/27/2021 10:00 A M Medical Record Number: [id number] Patient Account Number: [id number] Date of Birth/Sex: Treating RN: June 27, 1961 (60 y.o. [name] Primary Care Kwanza Cancelliere: [name] Other Clinician: Haywood Pao Referring Kjuan Seipp: Treating Siarah Deleo/Extender: [name] in Treatment: 7 Vital Signs Time Taken: 10:03 Temperature (F): 98.4 Height (in): 73 Pulse (bpm): 109 Weight (lbs): 270 Respiratory Rate (breaths/min): 20 Body Mass Index (BMI): 35.6 Blood Pressure (mmHg): 137/76 Capillary Blood Glucose (mg/dl): 102 Reference Range: 80 - 120 mg / dl Electronic Signature(s) Signed: 07/27/2021 2:01:36 PM By: Haywood Pao EMT Entered By: Haywood Pao on 07/27/2021 14:01:35

## 2021-07-27 NOTE — Progress Notes (Signed)
AMAURY, KUZEL ([id number]) Visit Report for 07/24/2021 SuperBill Details Patient Name: Date of Service: TADD, HOLTMEYER 07/24/2021 Medical Record Number: [id number] Patient Account Number: [id number] Date of Birth/Sex: Treating RN: 1961-08-20 (60 y.o. [name] Primary Care Provider: [name] Other Clinician: Haywood Pao Referring Provider: Treating Provider/Extender: [name] in Treatment: 7 Diagnosis Coding ICD-10 Codes Code Description E11.621 Type 2 diabetes mellitus with foot ulcer M14.672 Charcot's joint, left ankle and foot L97.528 Non-pressure chronic ulcer of other part of left foot with other specified severity M86.372 Chronic multifocal osteomyelitis, left ankle and foot Facility Procedures CPT4 Code Description Modifier Quantity [id number] G0277-(Facility Use Only) HBOT full body chamber, , 4 ICD-10 Diagnosis Description E11.621 Type 2 diabetes mellitus with foot ulcer M14.672 Charcot's joint, left ankle and foot L97.528 Non-pressure chronic ulcer of other part of left foot with other specified severity M86.372 Chronic multifocal osteomyelitis, left ankle and foot Physician Procedures Quantity CPT4 Code Description Modifier [id number] 99183 - WC PHYS HYPERBARIC OXYGEN THERAPY 1 ICD-10 Diagnosis Description E11.621 Type 2 diabetes mellitus with foot ulcer M14.672 Charcot's joint, left ankle and foot L97.528 Non-pressure chronic ulcer of other part of left foot with other specified severity M86.372 Chronic multifocal osteomyelitis, left ankle and foot Electronic Signature(s) Signed: 07/24/2021 2:04:53 PM By: Haywood Pao EMT Signed: 07/27/2021 9:40:45 AM By: [name] DO Entered By: Haywood Pao on 07/24/2021 14:04:52

## 2021-07-28 ENCOUNTER — Encounter (HOSPITAL_BASED_OUTPATIENT_CLINIC_OR_DEPARTMENT_OTHER): Payer: Medicare Other | Admitting: Internal Medicine

## 2021-07-28 DIAGNOSIS — E11621 Type 2 diabetes mellitus with foot ulcer: Secondary | ICD-10-CM | POA: Diagnosis not present

## 2021-07-28 DIAGNOSIS — L97528 Non-pressure chronic ulcer of other part of left foot with other specified severity: Secondary | ICD-10-CM

## 2021-07-28 DIAGNOSIS — M14672 Charcot's joint, left ankle and foot: Secondary | ICD-10-CM

## 2021-07-28 DIAGNOSIS — M86372 Chronic multifocal osteomyelitis, left ankle and foot: Secondary | ICD-10-CM | POA: Diagnosis not present

## 2021-07-28 LAB — GLUCOSE, CAPILLARY
Glucose-Capillary: 166 mg/dL — ABNORMAL HIGH (ref 70–99)
Glucose-Capillary: 147 mg/dL — ABNORMAL HIGH (ref 70–99)

## 2021-07-28 NOTE — Progress Notes (Signed)
DABNEY, SCHANZ ([id number]) Visit Report for 07/28/2021 SuperBill Details Patient Name: Date of Service: [name], [name] 07/28/2021 Medical Record Number: [id number] Patient Account Number: [id number] Date of Birth/Sex: Treating RN: April 10, 1961 (60 y.o. [name], [name] Primary Care Provider: [name] Other Clinician: Haywood Pao Referring Provider: Treating Provider/Extender: [name] in Treatment: 7 Diagnosis Coding ICD-10 Codes Code Description E11.621 Type 2 diabetes mellitus with foot ulcer M14.672 Charcot's joint, left ankle and foot L97.528 Non-pressure chronic ulcer of other part of left foot with other specified severity M86.372 Chronic multifocal osteomyelitis, left ankle and foot Facility Procedures CPT4 Code Description Modifier Quantity [id number] G0277-(Facility Use Only) HBOT full body chamber, , 4 ICD-10 Diagnosis Description E11.621 Type 2 diabetes mellitus with foot ulcer M14.672 Charcot's joint, left ankle and foot L97.528 Non-pressure chronic ulcer of other part of left foot with other specified severity M86.372 Chronic multifocal osteomyelitis, left ankle and foot Physician Procedures Quantity CPT4 Code Description Modifier [id number] 99183 - WC PHYS HYPERBARIC OXYGEN THERAPY 1 ICD-10 Diagnosis Description E11.621 Type 2 diabetes mellitus with foot ulcer M14.672 Charcot's joint, left ankle and foot L97.528 Non-pressure chronic ulcer of other part of left foot with other specified severity M86.372 Chronic multifocal osteomyelitis, left ankle and foot Electronic Signature(s) Signed: 07/28/2021 1:46:33 PM By: Haywood Pao EMT Signed: 07/28/2021 3:14:29 PM By: [name] DO Entered By: Haywood Pao on 07/28/2021 13:46:33

## 2021-07-28 NOTE — Progress Notes (Addendum)
HOLDYN, POYSER ([id number]) Visit Report for 07/28/2021 HBO Details Patient Name: Date of Service: [name], [name] 07/28/2021 10:00 A M Medical Record Number: [id number] Patient Account Number: [id number] Date of Birth/Sex: Treating RN: 09/19/1961 (60 y.o. [name] Primary Care Jakalyn Kratky: [name] Other Clinician: Haywood Pao Referring Raynell Scott: Treating Kajah Santizo/Extender: [name] in Treatment: 7 HBO Treatment Course Details Treatment Course Number: 1 Ordering Wilhelmina Hark: Baltazar Najjar T Treatments Ordered: otal 40 HBO Treatment Start Date: 07/07/2021 HBO Indication: Chronic Refractory Osteomyelitis to Left,Plantar Foot HBO Treatment Details Treatment Number: 14 Patient Type: Outpatient Chamber Type: Monoplace Chamber Serial #: [id number] Treatment Protocol: 2.5 ATA with 90 minutes oxygen, with two 5 minute air breaks Treatment Details Compression Rate Down: 2.0 psi / minute De-Compression Rate Up: 2.0 psi / minute A breaks and breathing ir Compress Tx Pressure periods Decompress Decompress Begins Reached (leave unused spaces Begins Ends blank) Chamber Pressure (ATA 1 2.5 2.5 2.5 2.5 2.5 - - 2.5 1 ) Clock Time (24 hr) 10:48 11:01 11:31 11:36 12:06 12:11 - - 12:41 12:52 Treatment Length: 124 (minutes) Treatment Segments: 4 Vital Signs Capillary Blood Glucose Reference Range: 80 - 120 mg / dl HBO Diabetic Blood Glucose Intervention Range: <131 mg/dl or >423 mg/dl Time Vitals Blood Respiratory Capillary Blood Glucose Pulse Action Type: Pulse: Temperature: Taken: Pressure: Rate: Glucose (mg/dl): Meter #: Oximetry (%) Taken: Pre 10:21 142/67 97 20 97.7 166 2 Post 12:52 129/67 96 20 97.4 147 2 Treatment Response Treatment Toleration: Well Treatment Completion Status: Treatment Completed without Adverse Event Physician HBO Attestation: I certify that I supervised this HBO treatment in accordance with Medicare guidelines. A trained  emergency response team is readily available per Yes hospital policies and procedures. Continue HBOT as ordered. Yes Electronic Signature(s) Signed: 07/28/2021 5:01:54 PM By: [name] DO Previous Signature: 07/28/2021 1:46:08 PM Version By: Haywood Pao EMT Previous Signature: 07/28/2021 3:14:29 PM Version By: [name] DO Entered By: [name] on 07/28/2021 17:00:09 -------------------------------------------------------------------------------- HBO Safety Checklist Details Patient Name: Date of Service: [name], [name] IG 07/28/2021 10:00 A M Medical Record Number: [id number] Patient Account Number: [id number] Date of Birth/Sex: Treating RN: October 31, 1960 (60 y.o. [name], [name] Primary Care Elaf Clauson: [name] Other Clinician: Haywood Pao Referring Sapphire Tygart: Treating Zaylon Bossier/Extender: [name] in Treatment: 7 HBO Safety Checklist Items Safety Checklist Consent Form Signed Patient voided / foley secured and emptied When did you last eato snack this AM Last dose of injectable or oral agent n/a Ostomy pouch emptied and vented if applicable NA All implantable devices assessed, documented and approved NA Intravenous access site secured and place NA Valuables secured Linens and cotton and cotton/polyester blend (less than 51% polyester) Personal oil-based products / skin lotions / body lotions removed Wigs or hairpieces removed NA Smoking or tobacco materials removed NA Books / newspapers / magazines / loose paper removed Cologne, aftershave, perfume and deodorant removed Jewelry removed (may wrap wedding band) Make-up removed NA Hair care products removed Hair Elastic Battery operated devices (external) removed Heating patches and chemical warmers removed Titanium eyewear removed Nail polish cured greater than 10 hours NA Casting material cured greater than 10 hours Cast applied at last Wound Care  Encounter Hearing aids removed NA Loose dentures or partials removed NA Prosthetics have been removed NA Patient demonstrates correct use of air break device (if applicable) Patient concerns have been addressed Patient grounding bracelet on and cord attached to chamber Specifics for Inpatients (complete in addition to above) Medication sheet  sent with patient NA Intravenous medications needed or due during therapy sent with patient NA Drainage tubes (e.g. nasogastric tube or chest tube secured and vented) NA Endotracheal or Tracheotomy tube secured NA Cuff deflated of air and inflated with saline NA Airway suctioned NA Notes Paper version used prior to treatment. Electronic Signature(s) Signed: 07/28/2021 2:10:21 PM By: Haywood Pao EMT Previous Signature: 07/28/2021 1:44:02 PM Version By: Haywood Pao EMT Entered By: Haywood Pao on 07/28/2021 14:10:20

## 2021-07-28 NOTE — Progress Notes (Addendum)
JAJA, [name] ([id number]) Visit Report for 07/28/2021 Arrival Information Details Patient Name: Date of Service: [name], [name] 07/28/2021 10:00 A M Medical Record Number: [id number] Patient Account Number: [id number] Date of Birth/Sex: Treating RN: [birth year] (60 y.o. [name], [name] Primary Care [name]: [name] Other Clinician: Haywood [name] Referring [name]: Treating [name]/Extender: [name] in Treatment: 7 Visit Information History Since Last Visit All ordered tests and consults were completed: Yes Patient Arrived: [name] Added or deleted any medications: No Arrival Time: 10:26 Any new allergies or adverse reactions: No Accompanied By: self Had a fall or experienced change in No Transfer Assistance: None activities of daily living that may affect Patient Identification Verified: Yes risk of falls: Secondary Verification Process Completed: Yes Signs or symptoms of abuse/neglect since last visito No Patient Requires Transmission-Based Precautions: No Hospitalized since last visit: No Patient Has Alerts: Yes Implantable device outside of the clinic excluding No cellular tissue based products placed in the center since last visit: Pain Present Now: No Electronic Signature(s) Signed: 07/28/2021 1:42:02 PM By: [name] EMT Entered By: [name] on 07/28/2021 13:42:02 -------------------------------------------------------------------------------- Encounter Discharge Information Details Patient Name: Date of Service: [name] 07/28/2021 10:00 A M Medical Record Number: [id number] Patient Account Number: [id number] Date of Birth/Sex: Treating RN: 1961/08/03 (60 y.o. [name] Primary Care Maleigha Colvard: [name] Other Clinician: Haywood [name] Referring [name]: Treating [name]/Extender: [name] in Treatment: 7 Encounter Discharge Information Items Discharge  Condition: Stable Ambulatory Status: Walker Discharge Destination: Other (Note Required) Transportation: Other Accompanied By: self Schedule Follow-up Appointment: No Clinical Summary of Care: Notes Wound Care Encounter after treatment. Electronic Signature(s) Signed: 07/28/2021 1:47:17 PM By: [name] EMT Entered By: [name] on 07/28/2021 13:47:17 -------------------------------------------------------------------------------- Vitals Details Patient Name: Date of Service: [name] 07/28/2021 10:00 A M Medical Record Number: [id number] Patient Account Number: [id number] Date of Birth/Sex: Treating RN: [birth year] (60 y.o. [name] Primary Care [name]: [name] Other Clinician: Haywood [name] Referring [name]: Treating [name]/Extender: [name] in Treatment: 7 Vital Signs Time Taken: 10:21 Temperature (F): 97.7 Weight (lbs): 270 Pulse (bpm): 97 Respiratory Rate (breaths/min): 20 Blood Pressure (mmHg): 142/67 Capillary Blood Glucose (mg/dl): 710 Reference Range: 80 - 120 mg / dl Electronic Signature(s) Signed: 07/28/2021 1:42:39 PM By: [name] EMT Entered By: [name] on 07/28/2021 13:42:38

## 2021-07-28 NOTE — Progress Notes (Addendum)
REDFORD, BEHRLE ([id number]) Visit Report for 07/24/2021 Arrival Information Details Patient Name: Date of Service: [name] 07/24/2021 10:00 A M Medical Record Number: [id number] Patient Account Number: [id number] Date of Birth/Sex: Treating RN: Apr 27, 1961 (60 y.o. [name], [name] Primary Care Lissy Deuser: [name] Other Clinician: Haywood Pao Referring Aamira Bischoff: Treating Vlad Mayberry/Extender: [name] in Treatment: 7 Visit Information History Since Last Visit All ordered tests and consults were completed: Yes Patient Arrived: [name] Added or deleted any medications: No Arrival Time: 09:49 Any new allergies or adverse reactions: No Accompanied By: self Had a fall or experienced change in No Transfer Assistance: None activities of daily living that may affect Patient Identification Verified: Yes risk of falls: Secondary Verification Process Completed: Yes Signs or symptoms of abuse/neglect since last visito No Patient Requires Transmission-Based Precautions: No Hospitalized since last visit: No Patient Has Alerts: Yes Implantable device outside of the clinic excluding No cellular tissue based products placed in the center since last visit: Pain Present Now: No Electronic Signature(s) Signed: 07/24/2021 1:55:50 PM By: Haywood Pao EMT Entered By: Haywood Pao on 07/24/2021 13:55:50 -------------------------------------------------------------------------------- Encounter Discharge Information Details Patient Name: Date of Service: [name] 07/24/2021 10:00 A M Medical Record Number: [id number] Patient Account Number: [id number] Date of Birth/Sex: Treating RN: 1961-07-30 (60 y.o. [name] Primary Care Arnette Driggs: [name] Other Clinician: Haywood Pao Referring Edla Para: Treating Krystin Keeven/Extender: [name] in Treatment: 7 Encounter Discharge Information Items Discharge  Condition: Stable Ambulatory Status: Walker Discharge Destination: Home Transportation: Private Auto Accompanied By: self Schedule Follow-up Appointment: No Clinical Summary of Care: Electronic Signature(s) Signed: 07/24/2021 2:05:22 PM By: Haywood Pao EMT Entered By: Haywood Pao on 07/24/2021 14:05:22 -------------------------------------------------------------------------------- Vitals Details Patient Name: Date of Service: [name] 07/24/2021 10:00 A M Medical Record Number: [id number] Patient Account Number: [id number] Date of Birth/Sex: Treating RN: 1961/05/22 (60 y.o. [name] Primary Care Yalissa Fink: [name] Other Clinician: Haywood Pao Referring Tiersa Dayley: Treating Jesseka Drinkard/Extender: [name] in Treatment: 7 Vital Signs Time Taken: 10:34 Temperature (F): 97.8 Height (in): 73 Pulse (bpm): 102 Weight (lbs): 270 Respiratory Rate (breaths/min): 18 Body Mass Index (BMI): 35.6 Blood Pressure (mmHg): 118/72 Capillary Blood Glucose (mg/dl): 732 Reference Range: 80 - 120 mg / dl Electronic Signature(s) Signed: 07/24/2021 1:58:33 PM By: Haywood Pao EMT Entered By: Haywood Pao on 07/24/2021 13:58:32

## 2021-07-28 NOTE — Progress Notes (Signed)
[name], [name] ([id number]) Visit Report for 07/28/2021 Chief Complaint Document Details Patient Name: Date of Service: [name], [name] 07/28/2021 1:30 PM Medical Record Number: [id number] Patient Account Number: [id number] Date of Birth/Sex: Treating RN: October 15, 1961 (60 y.o. [name] Primary Care Provider: [name] Other Clinician: Referring Provider: Treating Provider/Extender: [name] in Treatment: 7 Information Obtained from: Patient Chief Complaint Bilateral foot diabetic ulcers Electronic Signature(s) Signed: 07/28/2021 3:14:29 PM By: [name] DO Entered By: [name] on 07/28/2021 15:11:13 -------------------------------------------------------------------------------- HPI Details Patient Name: Date of Service: [name] IG 07/28/2021 1:30 PM Medical Record Number: [id number] Patient Account Number: [id number] Date of Birth/Sex: Treating RN: [birth year] (60 y.o. [name] Primary Care Provider: [name] Other Clinician: Referring Provider: Treating Provider/Extender: [name] in Treatment: 7 History of Present Illness HPI Description: 10/31/2019 upon evaluation today patient appears to be doing somewhat poorly in regard to his bilateral plantar feet. He has wounds that he tells me have been present since 2012 intermittently off and on. Most recently this has been open for at least the past 6 months to a year. He has been trying to treat this in different ways using Santyl along with various other dressings including Medihoney and even at one point Xeroform. Nothing really has seem to get this completely closed. He was recently in the hospital for cellulitis of his leg subsequently he did have x-rays as well as MRIs that showed negative for any signs of osteomyelitis in regard to the wounds on his feet. Fortunately there is no signs of systemic infection at this time. No fevers, chills,  nausea, vomiting, or diarrhea. Patient has previously used Darco offloading shoes as far as frontal floaters as well as postop surgical shoes. He has never been in a total contact cast that may be something we need to strongly consider here. Patient's most recent hemoglobin A1c 1 month ago was 5.3 seems to be very well controlled which is great. Subsequently he has seen vascular as well as podiatry. His ABIs are 1.07 on the left and 1.14 on the right he seems to be doing well he does have chronic venous stasis. 11/07/2019 upon evaluation today patient appears to be doing well with regard to his wounds all things considered. I do not see any severe worsening he still has some callus buildup on the right more than the left he notes that he has been probably more active than he should as far as walking is concerned is just very hard to not be active. He knows he needs to be more careful in this regard however. He is willing to give the cast a try at this point although he notes that he is a little nervous about this just with regard to balance although he will be very careful and obviously if he has any trouble he knows to contact the office and let me know. 1/22; patient is in for his obligatory first total contact cast change. Our intake nurse reported a very large amount of drainage which is spelled out over to the surrounding skin. Has bilateral diabetic foot wounds. He has Charcot feet. We have been using silver alginate on his wounds. 11/14/2019 on evaluation today patient is actually seeming to make good improvement in regard to his bilateral plantar foot wounds. We have been using a cast on the left side and on the right side he has been using dressings he is changing up his own accord. With that being said  he tells me that he is also not walking as much just due to how unsteady he feels. He takes it easy when he does have to walk and when he does not have to walk he is resting. This is  probably help in his right foot as well has the left foot which is actually measuring better. In fact both are measuring better. Overall I am very pleased with how things seem to be progressing. The patient does have some odor on the left foot this does have me concerned about the possibility of infection, and actually probably go ahead and put him on antibiotics today as well as utilizing a continuation of the cast on the left foot I think that will be fine we probably just need to bring him in sooner to change this not last a whole week. 1/29; we brought the patient back today for a total contact cast change on the left out of concern for excessive drainage. We are using drawtex over the wound as the primary dressing 11/21/2019 on evaluation today patient appears to be doing well with regard to his left plantar foot. In fact both foot ulcers actually seem to be doing pretty well. Nonetheless he is having a lot of drainage on the left at this time and again we did obtain a wound culture did show positive for Staph aureus that was reviewed by myself today as well. Nonetheless he is on Bactrim which was shown to be sensitive that should be helping in this regard. Fortunately there is no signs of infection systemically at this point. 2/5; back in clinic today for a total contact cast change apparently secondary to very significant drainage. Still using drawtex 11/28/2019 upon evaluation today patient appears to be doing well with regard to his wounds. The right foot is doing okay as measured about the same in my opinion. The left foot is actually showing signs of significant improvement is measuring smaller there is a lot of hyper granulation likely due to the continued drainage at this point. We did obtain approval for a snap VAC I think that is good to be appropriate for him and will likely help this tremendously underneath the cast. He is definitely in agreement with proceeding with such. 2/12; patient  came in today after his snap VAC lost suction. Brought in to see one of our nurses. The dressing was replaced and then we put the cast back on and rehooked up the snap VAC. Apparently his wound looked very good per our intake nurse. 2/15; again we replaced the cast on Friday. By Saturday the snap VAC and light suction. He called this morning he comes in acutely. The wounds look fine however the VAC is not functioning. We replaced the cast using silver alginate as the primary dressing backed with Kerramax. The snap VAC was not replaced 12/05/2019 upon evaluation today patient appears to be doing better in regard to his left plantar foot ulcer. Fortunately there is no signs of active infection at this time. Unfortunately he is continuing to have issues with the right foot he is really not making any progress here things seem to be somewhat stagnant to be honest. The depth has increased but that is due to me having debrided the wound in the past based on what I am seeing. 12/12/2019 on evaluation today patient appears to be doing more poorly in regard to the left lower extremity. He has some erythema spreading up the side of his foot I am concerned about  infection again at this point. Unfortunately he has been seeing improvement with a total contact cast but I do not think we should put that on today. On his right plantar foot he continues to have significant drainage this is actually measuring deeper I really do not feel like you are making any progress whatsoever. I have prescribed Granix for him unfortunately his insurance apparently was going to cost him a $500 co-pay. 12/19/2019 upon evaluation today patient actually appears to be doing better in regard to both wounds. With that being said he actually did get the reGranix which he had to pay $500 for. With that being said it does look like that he is actually made some improvement based on what I am seeing at this point with the reGranix. Obviously if he  is going to continue this we are going to do something about trying to get him some help in covering the cost. 12/26/2019 on evaluation today patient appears to be doing really much better even compared to last week. Overall the wound seems to be much better even compared to last week and last week was better than the week before. Since has been using the reGranix his symptoms have improved significantly. With that being said the issue right now is simply that this is a very expensive medication for him the first dose cost him $500. Upon inspection patient's wound bed actually is however dramatically improved compared to before he started this 2 weeks ago. 01/02/2020 upon evaluation today patient appears to actually be doing well. He still had a little bit of reGranix left that has been using in small amounts he just been applying it every other day instead of every day in order to make it go longer. Overall we are still seeing excellent improvement he is measuring smaller looking better healthier tissue and everything seems to be pointing to this headed in the right direction. Fortunately there is no evidence of infection either which is also excellent news. He does have his MRI coming up within the next week. 01/09/2020 upon evaluation today patient appears to be doing a little worse today compared to previous week's evaluation. He is actually been out of the reGranix at this point. He has been trying to make the stretch out so he has been changing the dressings on a regular set schedule like he was previous. I think this has made a difference. Fortunately there is no signs of active infection at this time. No fevers, chills, nausea, vomiting, or diarrhea. 01/16/2020 upon evaluation today patient appears to be doing well with regard to his left plantar foot ulcer. The right plantar foot still shows some significant depth at this point. Fortunately there is no signs of active infection at this  time. 01/30/2020 upon evaluation today patient appears to be doing about the same in regard to his right plantar foot ulcer there is still some depth here and we had to wait till he actually switched over to his new insurance to get approval for the MRI under his new insurance plan. With that being said he now has switched as of April 1. Fortunately there is no signs of active infection at this time. Overall in regard to his left foot ulcer this seems to be doing much better and I am actually very pleased with how things are going. With that being said it is not quite as much progress as we were seeing with the reGranix but at the same time he has had trouble getting  this apparently there is been some hindrance here. I Ernie Hew try to actually send this to [facility] that was recommended by the drug rep to me. 02/13/20 upon evaluation today patient appears to be doing better in regard to his left foot ulcer this is great news. Unfortunately the right foot ulcer is not really significantly better at this time. There is no signs of active infection systemically though he did have his MRI which showed unfortunately he does have infection noted including an abscess in the foot. There is also marrow changes noted which are consistent with osteomyelitis based on the radiology review and interpretation. Unfortunately considering that the wound is not really making the progress that we will he would like to be seen I think that this is an indication that he may need some further referral both infectious disease as well as potentially to podiatrist to see if there is anything that can be done to help with the situation that were dealing with here. The left foot again is doing great. READMISSION 06/04/2021 This is a 60 year old man who was in the clinic in 2021 followed by Allen Derry for areas on the right and left foot. He developed a left foot infection and was referred to ID. He left the clinic in a  nonhealed state and was followed for a period of [facility] Marylene Land. Apparently things really deteriorated in early July when he was admitted to hospital from 04/27/2021 through 05/06/2021 with sepsis secondary to a left foot infection. His blood cultures were negative. An MRI suggested fifth metatarsal osteomyelitis a left ankle septic joint. He was treated with vancomycin and ertapenem which he is still taking and may just about be finishing. He was seen by orthopedics and the patient adamantly refused to BKA. As far as I can tell he did not have the ankle aspirated I am not exactly sure what the issue was here. Since he has been discharged she is at [facility] for rehabilitation. He was last seen by Dr. Algis Liming on 05/20/2021 he noted osteo of the tibial talar bone cuboid and fifth metatarsal which is even more extensive than what was suggested by the MRI. He is apparently going for a consultation with orthopedic surgery in [place] sometime next week. I received a call about this man 2 weeks ago from Dr. Allyson Sabal who follows him for the possibility of PAD. ABIs I think done in the office showed a ABI on the right of 1.05 at the PTA and 0.99 at the PTA on the left. He had a DVT rule out in the left leg that was negative for the DVT. Past medical history is extensive and includes diastolic heart failure, right first metatarsal head ulcer in 2021, excision of the right second ray by Dr. Marylene Land on 03/13/2020, hypertension, hypothyroidism. Left total hip replacement, right total knee replacement, carpal tunnel syndrome, obstructive sleep apnea alcohol abuse with cirrhosis although the patient denies current alcohol intake. The patient does not think he is a diabetic however looking through  link I see 2 HgbAic's of this year that were greater than equal to 6.5 which by definition makes him diabetic. Nevertheless he is not on any treatment and does not check his blood  sugars. The patient is now back home out of the nursing home. Saw Dr. Algis Liming last week he was taken off vancomycin and ertapenem on August 22 and now is on doxycycline on Augmentin. He also saw Dr. Weston Anna who is  his orthopedic surgeon in [place] he recommended a [employer]	[place]. He has been using Medihoney. 9/6I have been having trouble getting hyperbarics approved through our prior authorization process. Even though he had a limb threatening infection in the left foot and probably the left ankle there glitches in how some of the reports are worded also some of the consultants. In any case I am going to repeat his sedimentation rate and C-reactive protein. I am generally not in favor of doing things like this as they really do not alter the plan of care from my point of view however I am going to need to demonstrate that these remain high in order to get this through forhyperbaric treatment for chronic refractory osteomyelitis 9/13; following this man for a wound on his left plantar foot in the setting of type 2 diabetes and Charcot deformity. He has underlying chronic refractory osteomyelitis. Follow-up sedimentation rate and C-reactive protein were both elevated but the C-reactive protein was down to 1.4, sedimentation rate at 70. Sedimentation rate was only slightly down from previous at 85. His wound is measuring slightly smaller. 9/20; patient started hyperbaric oxygen therapy today and tolerated treatment well. This is for the underlying osteomyelitis. He remains on antibiotics but thinks he is getting close to finishing. The wound on the plantar aspect of his foot is the other issue we are following here. He is using Medihoney The patient has a Charcot foot in the setting of type 2 diabetes. He is going to need a total contact cast although his partner was away this week and we elected to delay this till next week 9/27; patient still tolerating hyperbaric oxygen well. Wound looked generally  healthy not much depth under illumination still 100% covered in fibrinous debris. Raised callused edges around the wound he was prepared for a total contact cast. We have been using Medihoney 9/30; patient is back for his first obligatory total contact cast change. We are using Hydrofera Blue. Noted by our intake nurse to have a lot of drainage or at least a moderate amount of drainage. I am not sure I was previously aware of this 10/4; patient arrives today with a lot of drainage under the cast. When he had it changed last Friday there was also a similar amount of drainage. Her intake nurse says that they tried a wound VAC on him perhaps while I was on vacation in August under the cast but that did not work. In my experience that has not been unusual. We have been using Hydrofera Blue with all the secondary absorbers. The drainage today went right through all of our dressings. The patient is concerned about his foot being in a cast without much drainage. He is tolerating HBO well. There has been improvements in the wound in the mid part of his foot in the setting of a Charcot deformity 10/7; patient presents for cast change. He has no issues or complaints today. He denies signs of infection. 10/11; patient presents for cast change. At this time he would like to take a break from the cast. He would like to do daily dressing changes with Hydrofera Blue. He currently denies signs of infection. Electronic Signature(s) Signed: 07/28/2021 3:14:29 PM By: [name] DO Entered By: [name] on 07/28/2021 15:11:44 -------------------------------------------------------------------------------- Physical Exam Details Patient Name: Date of Service: DEO, MEHRINGER IG 07/28/2021 1:30 PM Medical Record Number: [id number] Patient Account Number: [id number] Date of Birth/Sex: Treating RN: March 22, 1961 (60 y.o. [name] Primary Care Provider: [name] Other  Clinician: Referring  Provider: Treating Provider/Extender: [name] in Treatment: 7 Constitutional respirations regular, non-labored and within target range for patient.. Cardiovascular 2+ dorsalis pedis/posterior tibialis pulses. Psychiatric pleasant and cooperative. Notes Open wound to the plantar aspect of the left foot with granulation tissue present And circumferential macerated callus. No obvious signs of infection. Electronic Signature(s) Signed: 07/28/2021 3:14:29 PM By: [name] DO Entered By: [name] on 07/28/2021 15:12:19 -------------------------------------------------------------------------------- Physician Orders Details Patient Name: Date of Service: RAINER, MOUNCE IG 07/28/2021 1:30 PM Medical Record Number: [id number] Patient Account Number: [id number] Date of Birth/Sex: Treating RN: [birth year] (60 y.o. [name] Primary Care Provider: [name] Other Clinician: Referring Provider: Treating Provider/Extender: [name] in Treatment: 7 Verbal / Phone Orders: No Diagnosis Coding ICD-10 Coding Code Description E11.621 Type 2 diabetes mellitus with foot ulcer M14.672 Charcot's joint, left ankle and foot L97.528 Non-pressure chronic ulcer of other part of left foot with other specified severity M86.372 Chronic multifocal osteomyelitis, left ankle and foot Follow-up Appointments ppointment in: - Friday Dr. Leanord Hawking Return A Bathing/ Shower/ Hygiene May shower and wash wound with soap and water. - prior to dressing change Edema Control - Lymphedema / SCD / Other Elevate legs to the level of the heart or above for 30 minutes daily and/or when sitting, a frequency of: - throughout the day Avoid standing for long periods of time. Exercise regularly Moisturize legs daily. Off-Loading Total Contact Cast to Left Lower Extremity - Size 4 ***Hold cast today and for Dr. Leanord Hawking to decide if cast is needed  Friday*** Additional Orders / Instructions Follow Nutritious Diet Hyperbaric Oxygen Therapy Wound #3 Left,Plantar Foot Evaluate for HBO Therapy - Starts 07/07/21 Indication: - Chronic Refractory Osteomyelitis 2.5 ATA for 90 Minutes with 2 Five (5) Minute A Breaks ir Total Number of Treatments: - 40 One treatments per day (delivered Monday through Friday unless otherwise specified in Special Instructions below): Finger stick Blood Glucose Pre- and Post- HBOT Treatment. Follow Hyperbaric Oxygen Glycemia Protocol Afrin (Oxymetazoline HCL) 0.05% nasal spray - 1 spray in both nostrils daily as needed prior to HBO treatment for difficulty clearing ears Wound Treatment Wound #3 - Foot Wound Laterality: Plantar, Left Cleanser: Soap and Water 1 x Per Day Discharge Instructions: May shower and wash wound with dial antibacterial soap and water prior to dressing change. Cleanser: Wound Cleanser 1 x Per Day Discharge Instructions: Cleanse the wound with wound cleanser prior to applying a clean dressing using gauze sponges, not tissue or cotton balls. Peri-Wound Care: Zinc Oxide Ointment 30g tube 1 x Per Day Discharge Instructions: Apply Zinc Oxide to periwound with each dressing change Prim Dressing: Hydrofera Blue Ready Foam, 2.5 x2.5 in 1 x Per Day ary Discharge Instructions: Apply to wound bed as instructed Secondary Dressing: ABD Pad, 5x9 1 x Per Day Discharge Instructions: Apply over primary dressing as directed. Secondary Dressing: Zetuvit Plus 4x8 in 1 x Per Day Discharge Instructions: Apply over primary dressing as directed. Secured With: [employer], 4.5x3.1 (in/yd) 1 x Per Day Discharge Instructions: Secure with Kerlix as directed. Secured With: 62M Medipore H Soft Cloth Surgical Tape, 2x2 (in/yd) 1 x Per Day Discharge Instructions: Secure dressing with tape as directed. GLYCEMIA INTERVENTIONS PROTOCOL PRE-HBO GLYCEMIA INTERVENTIONS ACTION INTERVENTION Obtain pre-HBO capillary  blood glucose (ensure 1 physician order is in chart). A. Notify HBO physician and await physician orders. 2 If result is 70 mg/dl or below: B. If the result meets the hospital definition of  a critical result, follow hospital policy. A. Give patient an 8 ounce Glucerna Shake, an 8 ounce Ensure, or 8 ounces of a Glucerna/Ensure equivalent dietary supplement*. B. Wait 30 minutes. If result is 71 mg/dl to 162 mg/dl: C. Retest patients capillary blood glucose (CBG). D. If result greater than or equal to 110 mg/dl, proceed with HBO. If result less than 110 mg/dl, notify HBO physician and consider holding HBO. If result is 131 mg/dl to 446 mg/dl: A. Proceed with HBO. A. Notify HBO physician and await physician orders. B. It is recommended to hold HBO and do If result is 250 mg/dl or greater: blood/urine ketone testing. C. If the result meets the hospital definition of a critical result, follow hospital policy. POST-HBO GLYCEMIA INTERVENTIONS ACTION INTERVENTION Obtain post HBO capillary blood glucose (ensure 1 physician order is in chart). A. Notify HBO physician and await physician orders. 2 If result is 70 mg/dl or below: B. If the result meets the hospital definition of a critical result, follow hospital policy. A. Give patient an 8 ounce Glucerna Shake, an 8 ounce Ensure, or 8 ounces of a Glucerna/Ensure equivalent dietary supplement*. B. Wait 15 minutes for symptoms of If result is 71 mg/dl to 950 mg/dl: hypoglycemia (i.e. nervousness, anxiety, sweating, chills, clamminess, irritability, confusion, tachycardia or dizziness). C. If patient asymptomatic, discharge patient. If patient symptomatic, repeat capillary blood glucose (CBG) and notify HBO physician. If result is 101 mg/dl to 722 mg/dl: A. Discharge patient. A. Notify HBO physician and await physician orders. B. It is recommended to do blood/urine ketone If result is 250 mg/dl or greater: testing. C. If  the result meets the hospital definition of a critical result, follow hospital policy. *Juice or candies are NOT equivalent products. If patient refuses the Glucerna or Ensure, please consult the hospital dietitian for an appropriate substitute. Electronic Signature(s) Signed: 07/28/2021 3:14:29 PM By: [name] DO Entered By: [name] on 07/28/2021 15:12:36 -------------------------------------------------------------------------------- Problem List Details Patient Name: Date of Service: TIBERIUS, ROMUALDO IG 07/28/2021 1:30 PM Medical Record Number: [id number] Patient Account Number: [id number] Date of Birth/Sex: Treating RN: 07-18-1961 (60 y.o. [name] Primary Care Provider: [name] Other Clinician: Referring Provider: Treating Provider/Extender: [name] in Treatment: 7 Active Problems ICD-10 Encounter Code Description Active Date MDM Diagnosis E11.621 Type 2 diabetes mellitus with foot ulcer 06/04/2021 No Yes M14.672 Charcot's joint, left ankle and foot 06/04/2021 No Yes L97.528 Non-pressure chronic ulcer of other part of left foot with other specified 06/04/2021 No Yes severity M86.372 Chronic multifocal osteomyelitis, left ankle and foot 06/04/2021 No Yes Inactive Problems Resolved Problems Electronic Signature(s) Signed: 07/28/2021 3:14:29 PM By: [name] DO Entered By: [name] on 07/28/2021 15:11:00 -------------------------------------------------------------------------------- Progress Note Details Patient Name: Date of Service: [name] IG 07/28/2021 1:30 PM Medical Record Number: [id number] Patient Account Number: [id number] Date of Birth/Sex: Treating RN: Apr 06, 1961 (60 y.o. [name] Primary Care Provider: [name] Other Clinician: Referring Provider: Treating Provider/Extender: [name] in Treatment: 7 Subjective Chief Complaint Information obtained  from Patient Bilateral foot diabetic ulcers History of Present Illness (HPI) 10/31/2019 upon evaluation today patient appears to be doing somewhat poorly in regard to his bilateral plantar feet. He has wounds that he tells me have been present since 2012 intermittently off and on. Most recently this has been open for at least the past 6 months to a year. He has been trying to treat this in different ways using Santyl along with various  other dressings including Medihoney and even at one point Xeroform. Nothing really has seem to get this completely closed. He was recently in the hospital for cellulitis of his leg subsequently he did have x-rays as well as MRIs that showed negative for any signs of osteomyelitis in regard to the wounds on his feet. Fortunately there is no signs of systemic infection at this time. No fevers, chills, nausea, vomiting, or diarrhea. Patient has previously used Darco offloading shoes as far as frontal floaters as well as postop surgical shoes. He has never been in a total contact cast that may be something we need to strongly consider here. Patient's most recent hemoglobin A1c 1 month ago was 5.3 seems to be very well controlled which is great. Subsequently he has seen vascular as well as podiatry. His ABIs are 1.07 on the left and 1.14 on the right he seems to be doing well he does have chronic venous stasis. 11/07/2019 upon evaluation today patient appears to be doing well with regard to his wounds all things considered. I do not see any severe worsening he still has some callus buildup on the right more than the left he notes that he has been probably more active than he should as far as walking is concerned is just very hard to not be active. He knows he needs to be more careful in this regard however. He is willing to give the cast a try at this point although he notes that he is a little nervous about this just with regard to balance although he will be very careful and  obviously if he has any trouble he knows to contact the office and let me know. 1/22; patient is in for his obligatory first total contact cast change. Our intake nurse reported a very large amount of drainage which is spelled out over to the surrounding skin. Has bilateral diabetic foot wounds. He has Charcot feet. We have been using silver alginate on his wounds. 11/14/2019 on evaluation today patient is actually seeming to make good improvement in regard to his bilateral plantar foot wounds. We have been using a cast on the left side and on the right side he has been using dressings he is changing up his own accord. With that being said he tells me that he is also not walking as much just due to how unsteady he feels. He takes it easy when he does have to walk and when he does not have to walk he is resting. This is probably help in his right foot as well has the left foot which is actually measuring better. In fact both are measuring better. Overall I am very pleased with how things seem to be progressing. The patient does have some odor on the left foot this does have me concerned about the possibility of infection, and actually probably go ahead and put him on antibiotics today as well as utilizing a continuation of the cast on the left foot I think that will be fine we probably just need to bring him in sooner to change this not last a whole week. 1/29; we brought the patient back today for a total contact cast change on the left out of concern for excessive drainage. We are using drawtex over the wound as the primary dressing 11/21/2019 on evaluation today patient appears to be doing well with regard to his left plantar foot. In fact both foot ulcers actually seem to be doing pretty well. Nonetheless he is having a  lot of drainage on the left at this time and again we did obtain a wound culture did show positive for Staph aureus that was reviewed by myself today as well. Nonetheless he is on  Bactrim which was shown to be sensitive that should be helping in this regard. Fortunately there is no signs of infection systemically at this point. 2/5; back in clinic today for a total contact cast change apparently secondary to very significant drainage. Still using drawtex 11/28/2019 upon evaluation today patient appears to be doing well with regard to his wounds. The right foot is doing okay as measured about the same in my opinion. The left foot is actually showing signs of significant improvement is measuring smaller there is a lot of hyper granulation likely due to the continued drainage at this point. We did obtain approval for a snap VAC I think that is good to be appropriate for him and will likely help this tremendously underneath the cast. He is definitely in agreement with proceeding with such. 2/12; patient came in today after his snap VAC lost suction. Brought in to see one of our nurses. The dressing was replaced and then we put the cast back on and rehooked up the snap VAC. Apparently his wound looked very good per our intake nurse. 2/15; again we replaced the cast on Friday. By Saturday the snap VAC and light suction. He called this morning he comes in acutely. The wounds look fine however the VAC is not functioning. We replaced the cast using silver alginate as the primary dressing backed with Kerramax. The snap VAC was not replaced 12/05/2019 upon evaluation today patient appears to be doing better in regard to his left plantar foot ulcer. Fortunately there is no signs of active infection at this time. Unfortunately he is continuing to have issues with the right foot he is really not making any progress here things seem to be somewhat stagnant to be honest. The depth has increased but that is due to me having debrided the wound in the past based on what I am seeing. 12/12/2019 on evaluation today patient appears to be doing more poorly in regard to the left lower extremity. He has  some erythema spreading up the side of his foot I am concerned about infection again at this point. Unfortunately he has been seeing improvement with a total contact cast but I do not think we should put that on today. On his right plantar foot he continues to have significant drainage this is actually measuring deeper I really do not feel like you are making any progress whatsoever. I have prescribed Granix for him unfortunately his insurance apparently was going to cost him a $500 co-pay. 12/19/2019 upon evaluation today patient actually appears to be doing better in regard to both wounds. With that being said he actually did get the reGranix which he had to pay $500 for. With that being said it does look like that he is actually made some improvement based on what I am seeing at this point with the reGranix. Obviously if he is going to continue this we are going to do something about trying to get him some help in covering the cost. 12/26/2019 on evaluation today patient appears to be doing really much better even compared to last week. Overall the wound seems to be much better even compared to last week and last week was better than the week before. Since has been using the reGranix his symptoms have improved significantly. With that  being said the issue right now is simply that this is a very expensive medication for him the first dose cost him $500. Upon inspection patient's wound bed actually is however dramatically improved compared to before he started this 2 weeks ago. 01/02/2020 upon evaluation today patient appears to actually be doing well. He still had a little bit of reGranix left that has been using in small amounts he just been applying it every other day instead of every day in order to make it go longer. Overall we are still seeing excellent improvement he is measuring smaller looking better healthier tissue and everything seems to be pointing to this headed in the right direction.  Fortunately there is no evidence of infection either which is also excellent news. He does have his MRI coming up within the next week. 01/09/2020 upon evaluation today patient appears to be doing a little worse today compared to previous week's evaluation. He is actually been out of the reGranix at this point. He has been trying to make the stretch out so he has been changing the dressings on a regular set schedule like he was previous. I think this has made a difference. Fortunately there is no signs of active infection at this time. No fevers, chills, nausea, vomiting, or diarrhea. 01/16/2020 upon evaluation today patient appears to be doing well with regard to his left plantar foot ulcer. The right plantar foot still shows some significant depth at this point. Fortunately there is no signs of active infection at this time. 01/30/2020 upon evaluation today patient appears to be doing about the same in regard to his right plantar foot ulcer there is still some depth here and we had to wait till he actually switched over to his new insurance to get approval for the MRI under his new insurance plan. With that being said he now has switched as of April 1. Fortunately there is no signs of active infection at this time. Overall in regard to his left foot ulcer this seems to be doing much better and I am actually very pleased with how things are going. With that being said it is not quite as much progress as we were seeing with the reGranix but at the same time he has had trouble getting this apparently there is been some hindrance here. I Ernie Hew try to actually send this to [facility] that was recommended by the drug rep to me. 02/13/20 upon evaluation today patient appears to be doing better in regard to his left foot ulcer this is great news. Unfortunately the right foot ulcer is not really significantly better at this time. There is no signs of active infection systemically though he did have his MRI  which showed unfortunately he does have infection noted including an abscess in the foot. There is also marrow changes noted which are consistent with osteomyelitis based on the radiology review and interpretation. Unfortunately considering that the wound is not really making the progress that we will he would like to be seen I think that this is an indication that he may need some further referral both infectious disease as well as potentially to podiatrist to see if there is anything that can be done to help with the situation that were dealing with here. The left foot again is doing great. READMISSION 06/04/2021 This is a 60 year old man who was in the clinic in 2021 followed by Allen Derry for areas on the right and left foot. He developed a left foot infection and  was referred to ID. He left the clinic in a nonhealed state and was followed for a period of [facility] Marylene Land. Apparently things really deteriorated in early July when he was admitted to hospital from 04/27/2021 through 05/06/2021 with sepsis secondary to a left foot infection. His blood cultures were negative. An MRI suggested fifth metatarsal osteomyelitis a left ankle septic joint. He was treated with vancomycin and ertapenem which he is still taking and may just about be finishing. He was seen by orthopedics and the patient adamantly refused to BKA. As far as I can tell he did not have the ankle aspirated I am not exactly sure what the issue was here. Since he has been discharged she is at [facility] for rehabilitation. He was last seen by Dr. Algis Liming on 05/20/2021 he noted osteo of the tibial talar bone cuboid and fifth metatarsal which is even more extensive than what was suggested by the MRI. He is apparently going for a consultation with orthopedic surgery in [place] sometime next week. I received a call about this man 2 weeks ago from Dr. Allyson Sabal who follows him for the possibility of PAD. ABIs I  think done in the office showed a ABI on the right of 1.05 at the PTA and 0.99 at the PTA on the left. He had a DVT rule out in the left leg that was negative for the DVT. Past medical history is extensive and includes diastolic heart failure, right first metatarsal head ulcer in 2021, excision of the right second ray by Dr. Marylene Land on 03/13/2020, hypertension, hypothyroidism. Left total hip replacement, right total knee replacement, carpal tunnel syndrome, obstructive sleep apnea alcohol abuse with cirrhosis although the patient denies current alcohol intake. The patient does not think he is a diabetic however looking through [place] link I see 2 HgbAic's of this year that were greater than equal to 6.5 which by definition makes him diabetic. Nevertheless he is not on any treatment and does not check his blood sugars. The patient is now back home out of the nursing home. Saw Dr. Algis Liming last week he was taken off vancomycin and ertapenem on August 22 and now is on doxycycline on Augmentin. He also saw Dr. Weston Anna who is his orthopedic surgeon in [place] he recommended a [employer]	[place]. He has been using Medihoney. 9/6I have been having trouble getting hyperbarics approved through our prior authorization process. Even though he had a limb threatening infection in the left foot and probably the left ankle there glitches in how some of the reports are worded also some of the consultants. In any case I am going to repeat his sedimentation rate and C-reactive protein. I am generally not in favor of doing things like this as they really do not alter the plan of care from my point of view however I am going to need to demonstrate that these remain high in order to get this through forhyperbaric treatment for chronic refractory osteomyelitis 9/13; following this man for a wound on his left plantar foot in the setting of type 2 diabetes and Charcot deformity. He has underlying chronic refractory osteomyelitis.  Follow-up sedimentation rate and C-reactive protein were both elevated but the C-reactive protein was down to 1.4, sedimentation rate at 70. Sedimentation rate was only slightly down from previous at 85. His wound is measuring slightly smaller. 9/20; patient started hyperbaric oxygen therapy today and tolerated treatment well. This is for the underlying osteomyelitis. He remains on antibiotics  but thinks he is getting close to finishing. The wound on the plantar aspect of his foot is the other issue we are following here. He is using Medihoney The patient has a Charcot foot in the setting of type 2 diabetes. He is going to need a total contact cast although his partner was away this week and we elected to delay this till next week 9/27; patient still tolerating hyperbaric oxygen well. Wound looked generally healthy not much depth under illumination still 100% covered in fibrinous debris. Raised callused edges around the wound he was prepared for a total contact cast. We have been using Medihoney 9/30; patient is back for his first obligatory total contact cast change. We are using Hydrofera Blue. Noted by our intake nurse to have a lot of drainage or at least a moderate amount of drainage. I am not sure I was previously aware of this 10/4; patient arrives today with a lot of drainage under the cast. When he had it changed last Friday there was also a similar amount of drainage. Her intake nurse says that they tried a wound VAC on him perhaps while I was on vacation in August under the cast but that did not work. In my experience that has not been unusual. We have been using Hydrofera Blue with all the secondary absorbers. The drainage today went right through all of our dressings. The patient is concerned about his foot being in a cast without much drainage. He is tolerating HBO well. There has been improvements in the wound in the mid part of his foot in the setting of a Charcot deformity 10/7;  patient presents for cast change. He has no issues or complaints today. He denies signs of infection. 10/11; patient presents for cast change. At this time he would like to take a break from the cast. He would like to do daily dressing changes with Hydrofera Blue. He currently denies signs of infection. Patient History Information obtained from Patient. Family History Unknown History. Social History Former smoker, Marital Status - Divorced, Alcohol Use - Rarely - history of alcoholism, Drug Use - Current History - Marijuana, Caffeine Use - Moderate. Medical History Hematologic/Lymphatic Patient has history of Anemia Respiratory Patient has history of Sleep Apnea Denies history of Asthma, Chronic Obstructive Pulmonary Disease (COPD) Cardiovascular Patient has history of Congestive Heart Failure, Hypertension, Peripheral Venous Disease Gastrointestinal Patient has history of Cirrhosis Denies history of Crohnoos Endocrine Patient has history of Type II Diabetes Musculoskeletal Patient has history of Osteoarthritis Neurologic Patient has history of Neuropathy Medical A Surgical History Notes nd Gastrointestinal Hx ETOH abuse Endocrine Hypothyroidism Objective Constitutional respirations regular, non-labored and within target range for patient.. Vitals Time Taken: 12:52 PM, Height: 73 in, Weight: 270 lbs, BMI: 35.6, Temperature: 97.4 F, Pulse: 96 bpm, Respiratory Rate: 20 breaths/min, Blood Pressure: 129/67 mmHg, Capillary Blood Glucose: 147 mg/dl. General Notes: Perform in Hyberbarics. See hyberbaric note. Cardiovascular 2+ dorsalis pedis/posterior tibialis pulses. Psychiatric pleasant and cooperative. General Notes: Open wound to the plantar aspect of the left foot with granulation tissue present And circumferential macerated callus. No obvious signs of infection. Integumentary (Hair, Skin) Wound #3 status is Open. Original cause of wound was Gradually Appeared. The date  acquired was: 03/18/2021. The wound has been in treatment 7 weeks. The wound is located on the Left,Plantar Foot. The wound measures 1.5cm length x 2.5cm width x 0.1cm depth; 2.945cm^2 area and 0.295cm^3 volume. There is Fat Layer (Subcutaneous Tissue) exposed. There is no tunneling or undermining  noted. There is a medium amount of serosanguineous drainage noted. The wound margin is distinct with the outline attached to the wound base. There is large (67-100%) red granulation within the wound bed. There is no necrotic tissue within the wound bed. General Notes: callous and maceration present to periwound. Assessment Active Problems ICD-10 Type 2 diabetes mellitus with foot ulcer Charcot's joint, left ankle and foot Non-pressure chronic ulcer of other part of left foot with other specified severity Chronic multifocal osteomyelitis, left ankle and foot Patient's wound is stable. No signs of surrounding infection. He would like to take a break from the cast. He wants to do daily dressing changes with Hydrofera Blue. He will follow-up on Friday with Dr. Leanord Hawking to reevaluate for cast placement. Plan Follow-up Appointments: Return Appointment in: - Friday Dr. Leanord Hawking Bathing/ Shower/ Hygiene: May shower and wash wound with soap and water. - prior to dressing change Edema Control - Lymphedema / SCD / Other: Elevate legs to the level of the heart or above for 30 minutes daily and/or when sitting, a frequency of: - throughout the day Avoid standing for long periods of time. Exercise regularly Moisturize legs daily. Off-Loading: T Contact Cast to Left Lower Extremity - Size 4 ***Hold cast today and for Dr. Leanord Hawking to decide if cast is needed Friday*** otal Additional Orders / Instructions: Follow Nutritious Diet Hyperbaric Oxygen Therapy: Wound #3 Left,Plantar Foot: Evaluate for HBO Therapy - Starts 07/07/21 Indication: - Chronic Refractory Osteomyelitis 2.5 ATA for 90 Minutes with 2 Five (5)  Minute Air Breaks T Number of Treatments: - 40 otal One treatments per day (delivered Monday through Friday unless otherwise specified in Special Instructions below): Finger stick Blood Glucose Pre- and Post- HBOT Treatment. Follow Hyperbaric Oxygen Glycemia Protocol Afrin (Oxymetazoline HCL) 0.05% nasal spray - 1 spray in both nostrils daily as needed prior to HBO treatment for difficulty clearing ears WOUND #3: - Foot Wound Laterality: Plantar, Left Cleanser: Soap and Water 1 x Per Day/ Discharge Instructions: May shower and wash wound with dial antibacterial soap and water prior to dressing change. Cleanser: Wound Cleanser 1 x Per Day/ Discharge Instructions: Cleanse the wound with wound cleanser prior to applying a clean dressing using gauze sponges, not tissue or cotton balls. Peri-Wound Care: Zinc Oxide Ointment 30g tube 1 x Per Day/ Discharge Instructions: Apply Zinc Oxide to periwound with each dressing change Prim Dressing: Hydrofera Blue Ready Foam, 2.5 x2.5 in 1 x Per Day/ ary Discharge Instructions: Apply to wound bed as instructed Secondary Dressing: ABD Pad, 5x9 1 x Per Day/ Discharge Instructions: Apply over primary dressing as directed. Secondary Dressing: Zetuvit Plus 4x8 in 1 x Per Day/ Discharge Instructions: Apply over primary dressing as directed. Secured With: [employer], 4.5x3.1 (in/yd) 1 x Per Day/ Discharge Instructions: Secure with Kerlix as directed. Secured With: 47M Medipore H Soft Cloth Surgical T ape, 2x2 (in/yd) 1 x Per Day/ Discharge Instructions: Secure dressing with tape as directed. 1. Hydrofera Blue 2. Follow-up at the end of the week Electronic Signature(s) Signed: 07/28/2021 3:14:29 PM By: [name] DO Entered By: [name] on 07/28/2021 15:13:35 -------------------------------------------------------------------------------- HxROS Details Patient Name: Date of Service: [name] IG 07/28/2021 1:30 PM Medical Record  Number: [id number] Patient Account Number: [id number] Date of Birth/Sex: Treating RN: [birth year] (60 y.o. [name] Primary Care Provider: [name] Other Clinician: Referring Provider: Treating Provider/Extender: [name] in Treatment: 7 Information Obtained From Patient Hematologic/Lymphatic Medical History: Positive for: Anemia Respiratory Medical  History: Positive for: Sleep Apnea Negative for: Asthma; Chronic Obstructive Pulmonary Disease (COPD) Cardiovascular Medical History: Positive for: Congestive Heart Failure; Hypertension; Peripheral Venous Disease Gastrointestinal Medical History: Positive for: Cirrhosis Negative for: Crohns Past Medical History Notes: Hx ETOH abuse Endocrine Medical History: Positive for: Type II Diabetes Past Medical History Notes: Hypothyroidism Time with diabetes: 2017 Treated with: Diet Blood sugar tested every day: No Musculoskeletal Medical History: Positive for: Osteoarthritis Neurologic Medical History: Positive for: Neuropathy Immunizations Pneumococcal Vaccine: Received Pneumococcal Vaccination: No Implantable Devices None Family and Social History Unknown History: Yes; Former smoker; Marital Status - Divorced; Alcohol Use: Rarely - history of alcoholism; Drug Use: Current History - Marijuana; Caffeine Use: Moderate; Financial Concerns: No; Food, Clothing or Shelter Needs: No; Support System Lacking: No; Transportation Concerns: No Electronic Signature(s) Signed: 07/28/2021 3:14:29 PM By: [name] DO Signed: 07/28/2021 5:07:29 PM By: Shawn Stall RN, BSN Entered By: [name] on 07/28/2021 15:11:50 -------------------------------------------------------------------------------- SuperBill Details Patient Name: Date of Service: [name] IG 07/28/2021 Medical Record Number: [id number] Patient Account Number: [id number] Date of Birth/Sex: Treating RN: [birth year] (60  y.o. [name] Primary Care Provider: [name] Other Clinician: Referring Provider: Treating Provider/Extender: Morley Kos Weeks in Treatment: 7 Diagnosis Coding ICD-10 Codes Code Description E11.621 Type 2 diabetes mellitus with foot ulcer M14.672 Charcot's joint, left ankle and foot L97.528 Non-pressure chronic ulcer of other part of left foot with other specified severity M86.372 Chronic multifocal osteomyelitis, left ankle and foot Facility Procedures CPT4 Code: 21308657 Description: 99213 - WOUND CARE VISIT-LEV 3 EST PT Modifier: Quantity: 1 Physician Procedures : CPT4 Code Description Modifier [id number] 99213 - WC PHYS LEVEL 3 - EST PT ICD-10 Diagnosis Description E11.621 Type 2 diabetes mellitus with foot ulcer M14.672 Charcot's joint, left ankle and foot L97.528 Non-pressure chronic ulcer of other part of left  foot with other specified severity M86.372 Chronic multifocal osteomyelitis, left ankle and foot Quantity: 1 Electronic Signature(s) Signed: 07/28/2021 3:14:29 PM By: [name] DO Entered By: [name] on 07/28/2021 15:13:52

## 2021-07-28 NOTE — Progress Notes (Signed)
KIPLING, GRASER ([id number]) Visit Report for 07/27/2021 SuperBill Details Patient Name: Date of Service: [name], [name] 07/27/2021 Medical Record Number: [id number] Patient Account Number: [id number] Date of Birth/Sex: Treating RN: 01-13-1961 (60 y.o. [name] Primary Care Provider: [name] Other Clinician: Haywood Pao Referring Provider: Treating Provider/Extender: [name] in Treatment: 7 Diagnosis Coding ICD-10 Codes Code Description E11.621 Type 2 diabetes mellitus with foot ulcer M14.672 Charcot's joint, left ankle and foot L97.528 Non-pressure chronic ulcer of other part of left foot with other specified severity M86.372 Chronic multifocal osteomyelitis, left ankle and foot Facility Procedures CPT4 Code Description Modifier Quantity [id number] G0277-(Facility Use Only) HBOT full body chamber, , 2 ICD-10 Diagnosis Description E11.621 Type 2 diabetes mellitus with foot ulcer M14.672 Charcot's joint, left ankle and foot L97.528 Non-pressure chronic ulcer of other part of left foot with other specified severity M86.372 Chronic multifocal osteomyelitis, left ankle and foot Physician Procedures Quantity CPT4 Code Description Modifier [id number] 99183 - WC PHYS HYPERBARIC OXYGEN THERAPY 1 ICD-10 Diagnosis Description E11.621 Type 2 diabetes mellitus with foot ulcer M14.672 Charcot's joint, left ankle and foot L97.528 Non-pressure chronic ulcer of other part of left foot with other specified severity M86.372 Chronic multifocal osteomyelitis, left ankle and foot Electronic Signature(s) Signed: 07/27/2021 2:11:18 PM By: Haywood Pao EMT Signed: 07/28/2021 12:41:04 PM By: [name] DO Entered By: Haywood Pao on 07/27/2021 14:11:17

## 2021-07-29 ENCOUNTER — Encounter (HOSPITAL_BASED_OUTPATIENT_CLINIC_OR_DEPARTMENT_OTHER): Payer: Medicare Other | Admitting: Internal Medicine

## 2021-07-29 NOTE — Progress Notes (Signed)
[name], [name] ([id number]) Visit Report for 07/24/2021 Arrival Information Details Patient Name: Date of Service: [name], [name] 07/24/2021 9:15 A M Medical Record Number: [id number] Patient Account Number: [id number] Date of Birth/Sex: Treating RN: [birth year] (60 y.o. Marcheta Grammes Primary Care Lorelai Huyser: Billey Gosling Other Clinician: Referring Prynce Jacober: Treating Lacosta Hargan/Extender: Mickle Asper in Treatment: 7 Visit Information History Since Last Visit Added or deleted any medications: No Patient Arrived: [name] Any new allergies or adverse reactions: No Arrival Time: 10:01 Had a fall or experienced change in No Accompanied By: self activities of daily living that may affect Transfer Assistance: Manual risk of falls: Patient Identification Verified: Yes Signs or symptoms of abuse/neglect since last visito No Secondary Verification Process Completed: Yes Hospitalized since last visit: No Patient Requires Transmission-Based Precautions: No Implantable device outside of the clinic excluding No Patient Has Alerts: Yes cellular tissue based products placed in the center since last visit: Has Dressing in Place as Prescribed: Yes Pain Present Now: No Electronic Signature(s) Signed: 07/29/2021 1:46:44 PM By: Sandre Kitty Entered By: Sandre Kitty on 07/24/2021 10:01:39 -------------------------------------------------------------------------------- Encounter Discharge Information Details Patient Name: Date of Service: [name] 07/24/2021 9:15 A M Medical Record Number: [id number] Patient Account Number: [id number] Date of Birth/Sex: Treating RN: Mar 23, 1961 (60 y.o. [name] Primary Care Jashira Cotugno: Billey Gosling Other Clinician: Referring Claudell Rhody: Treating Chelci [place]/Extender: Mickle Asper in Treatment: 7 Encounter Discharge Information Items Discharge Condition: Stable Ambulatory Status: Walker Discharge  Destination: Home Transportation: Private Auto Accompanied By: alone Schedule Follow-up Appointment: Yes Clinical Summary of Care: Patient Declined Electronic Signature(s) Signed: 07/27/2021 4:58:30 PM By: [name] RN, BSN Entered By: [name] on 07/24/2021 12:23:43 -------------------------------------------------------------------------------- Multi Wound Chart Details Patient Name: Date of Service: [name] 07/24/2021 9:15 A M Medical Record Number: [id number] Patient Account Number: [id number] Date of Birth/Sex: Treating RN: October 08, 1961 (60 y.o. Marcheta Grammes Primary Care Hendricks Schwandt: Billey Gosling Other Clinician: Referring Dusty Raczkowski: Treating Lorianna Spadaccini/Extender: Lucile Crater Weeks in Treatment: 7 Vital Signs Height(in): 73 Pulse(bpm): 106 Weight(lbs): 270 Blood Pressure(mmHg): 139/83 Body Mass Index(BMI): 36 Temperature(F): 97.7 Respiratory Rate(breaths/min): 18 Photos: [3:No Photos Left, Plantar Foot] [N/A:N/A N/A] Wound Location: [3:Gradually Appeared] [N/A:N/A] Wounding Event: [3:Diabetic Wound/Ulcer of the Lower] [N/A:N/A] Primary Etiology: [3:Extremity 03/18/2021] [N/A:N/A] Date Acquired: [3:7] [N/A:N/A] Weeks of Treatment: [3:Open] [N/A:N/A] Wound Status: [3:1.5x2.5x0.2] [N/A:N/A] Measurements L x W x D (cm) [3:2.945] [N/A:N/A] A (cm) : rea [3:0.589] [N/A:N/A] Volume (cm) : [3:58.30%] [N/A:N/A] % Reduction in A rea: [3:16.70%] [N/A:N/A] % Reduction in Volume: [3:Grade 3] [N/A:N/A] Classification: [3:Medium] [N/A:N/A] Exudate A mount: [3:Serosanguineous] [N/A:N/A] Exudate Type: [3:red, brown] [N/A:N/A] Exudate Color: [3:T Contact Cast otal] [N/A:N/A] Treatment Notes Wound #3 (Foot) Wound Laterality: Plantar, Left Cleanser Soap and Water Discharge Instruction: May shower and wash wound with dial antibacterial soap and water prior to dressing change. Wound Cleanser Discharge Instruction: Cleanse the wound with wound  cleanser prior to applying a clean dressing using gauze sponges, not tissue or cotton balls. Peri-Wound Care Zinc Oxide Ointment 30g tube Discharge Instruction: Apply Zinc Oxide to periwound with each dressing change Topical Primary Dressing Hydrofera Blue Ready Foam, 2.5 x2.5 in Discharge Instruction: Apply to wound bed as instructed Secondary Dressing ABD Pad, 5x9 Discharge Instruction: Apply over primary dressing as directed. Zetuvit Plus 4x8 in Discharge Instruction: Apply over primary dressing as directed. Secured With [employer], 4.5x3.1 (in/yd) Discharge Instruction: Secure with Kerlix as directed. 42M Medipore H Soft Cloth Surgical T ape, 2x2 (in/yd) Discharge Instruction:  Secure dressing with tape as directed. Compression Wrap Compression Stockings Add-Ons Electronic Signature(s) Signed: 07/27/2021 9:40:45 AM By: Kalman Shan DO Signed: 07/27/2021 5:11:49 PM By: Lorrin Jackson Entered By: Kalman Shan on 07/24/2021 12:53:19 -------------------------------------------------------------------------------- Multi-Disciplinary Care Plan Details Patient Name: Date of Service: [name] 07/24/2021 9:15 A M Medical Record Number: [id number] Patient Account Number: [id number] Date of Birth/Sex: Treating RN: Jan 18, 1961 (60 y.o. [name] Primary Care Wendle Kina: Billey Gosling Other Clinician: Referring Tynika Luddy: Treating Edwen Mclester/Extender: Mickle Asper in Treatment: 7 Multidisciplinary Care Plan reviewed with physician Active Inactive HBO Nursing Diagnoses: Anxiety related to feelings of confinement associated with the hyperbaric oxygen chamber Anxiety related to knowledge deficit of hyperbaric oxygen therapy and treatment procedures Discomfort related to temperature and humidity changes inside hyperbaric chamber Potential for barotraumas to ears, sinuses, teeth, and lungs or cerebral gas embolism related to changes in  atmospheric pressure inside hyperbaric oxygen chamber Potential for oxygen toxicity seizures related to delivery of 100% oxygen at an increased atmospheric pressure Potential for pulmonary oxygen toxicity related to delivery of 100% oxygen at an increased atmospheric pressure Goals: Barotrauma will be prevented during HBO2 Date Initiated: 06/04/2021 T arget Resolution Date: 08/14/2021 Goal Status: Active Patient and/or family will be able to state/discuss factors appropriate to the management of their disease process during treatment Date Initiated: 06/04/2021 T arget Resolution Date: 08/14/2021 Goal Status: Active Patient will tolerate the hyperbaric oxygen therapy treatment Date Initiated: 06/04/2021 T arget Resolution Date: 08/14/2021 Goal Status: Active Patient will tolerate the internal climate of the chamber Date Initiated: 06/04/2021 T arget Resolution Date: 08/14/2021 Goal Status: Active Patient/caregiver will verbalize understanding of HBO goals, rationale, procedures and potential hazards Date Initiated: 06/04/2021 T arget Resolution Date: 08/14/2021 Goal Status: Active Signs and symptoms of pulmonary oxygen toxicity will be recognized and promptly addressed Date Initiated: 06/04/2021 T arget Resolution Date: 08/14/2021 Goal Status: Active Signs and symptoms of seizure will be recognized and promptly addressed ; seizing patients will suffer no harm Date Initiated: 06/04/2021 T arget Resolution Date: 08/14/2021 Goal Status: Active Interventions: Administer a five (5) minute air break for patient if signs and symptoms of seizure appear and notify the hyperbaric physician Administer decongestants, per physician orders, prior to HBO2 Administer the correct therapeutic gas delivery based on the patients needs and limitations, per physician order Assess and provide for patients comfort related to the hyperbaric environment and equalization of middle ear Assess for signs and  symptoms related to adverse events, including but not limited to confinement anxiety, pneumothorax, oxygen toxicity and baurotrauma Assess patient for any history of confinement anxiety Assess patient's knowledge and expectations regarding hyperbaric medicine and provide education related to the hyperbaric environment, goals of treatment and prevention of adverse events Implement protocols to decrease risk of pneumothorax in high risk patients Notes: Nutrition Nursing Diagnoses: Impaired glucose control: actual or potential Potential for alteratiion in Nutrition/Potential for imbalanced nutrition Goals: Patient/caregiver agrees to and verbalizes understanding of need to use nutritional supplements and/or vitamins as prescribed Date Initiated: 06/04/2021 Date Inactivated: 07/07/2021 Target Resolution Date: 07/04/2021 Goal Status: Met Patient/caregiver will maintain therapeutic glucose control Date Initiated: 06/04/2021 Target Resolution Date: 08/04/2021 Goal Status: Active Interventions: Assess HgA1c results as ordered upon admission and as needed Assess patient nutrition upon admission and as needed per policy Provide education on elevated blood sugars and impact on wound healing Notes: 07/07/21: Glucose control ongoing Wound/Skin Impairment Nursing Diagnoses: Impaired tissue integrity Knowledge deficit related to ulceration/compromised skin integrity Goals: Patient/caregiver will verbalize  understanding of skin care regimen Date Initiated: 06/04/2021 Target Resolution Date: 08/04/2021 Goal Status: Active Interventions: Assess patient/caregiver ability to obtain necessary supplies Assess patient/caregiver ability to perform ulcer/skin care regimen upon admission and as needed Assess ulceration(s) every visit Provide education on ulcer and skin care Notes: Electronic Signature(s) Signed: 07/27/2021 4:58:30 PM By: [name] RN, BSN Entered By: [name] on 07/24/2021  12:22:08 -------------------------------------------------------------------------------- Pain Assessment Details Patient Name: Date of Service: [name] 07/24/2021 9:15 A M Medical Record Number: [id number] Patient Account Number: [id number] Date of Birth/Sex: Treating RN: 07/12/1961 (60 y.o. Marcheta Grammes Primary Care Laveda Demedeiros: Billey Gosling Other Clinician: Referring Shaili Donalson: Treating Ekaterini Capitano/Extender: Lucile Crater Weeks in Treatment: 7 Active Problems Location of Pain Severity and Description of Pain Patient Has Paino No Site Locations Pain Management and Medication Current Pain Management: Electronic Signature(s) Signed: 07/27/2021 5:11:49 PM By: Lorrin Jackson Signed: 07/29/2021 1:46:44 PM By: Sandre Kitty Entered By: Sandre Kitty on 07/24/2021 10:02:02 -------------------------------------------------------------------------------- Patient/Caregiver Education Details Patient Name: Date of Service: [name] 10/7/2022andnbsp9:15 A M Medical Record Number: [id number] Patient Account Number: [id number] Date of Birth/Gender: Treating RN: 1961/10/04 (60 y.o. [name] Primary Care Physician: Billey Gosling Other Clinician: Referring Physician: Treating Physician/Extender: Mickle Asper in Treatment: 7 Education Assessment Education Provided To: Patient Education Topics Provided Wound/Skin Impairment: Methods: Explain/Verbal Responses: State content correctly Electronic Signature(s) Signed: 07/27/2021 4:58:30 PM By: [name] RN, BSN Entered By: [name] on 07/24/2021 12:22:18 -------------------------------------------------------------------------------- Wound Assessment Details Patient Name: Date of Service: ILYAS, LIPSITZ [name] 07/24/2021 9:15 A M Medical Record Number: [id number] Patient Account Number: [id number] Date of Birth/Sex: Treating RN: February 02, 1961 (60 y.o. Marcheta Grammes Primary Care Elize Pinon: Billey Gosling Other Clinician: Referring Ashyia Schraeder: Treating Lukah Goswami/Extender: Lucile Crater Weeks in Treatment: 7 Wound Status Wound Number: 3 Primary Etiology: Diabetic Wound/Ulcer of the Lower Extremity Wound Location: Left, Plantar Foot Wound Status: Open Wounding Event: Gradually Appeared Date Acquired: 03/18/2021 Weeks Of Treatment: 7 Clustered Wound: No Wound Measurements Length: (cm) 1.5 Width: (cm) 2.5 Depth: (cm) 0.2 Area: (cm) 2.945 Volume: (cm) 0.589 % Reduction in Area: 58.3% % Reduction in Volume: 16.7% Wound Description Classification: Grade 3 Exudate Amount: Medium Exudate Type: Serosanguineous Exudate Color: red, brown Electronic Signature(s) Signed: 07/27/2021 5:11:49 PM By: Lorrin Jackson Signed: 07/29/2021 1:46:44 PM By: Sandre Kitty Entered By: Sandre Kitty on 07/24/2021 10:02:11 -------------------------------------------------------------------------------- Vitals Details Patient Name: Date of Service: [name] 07/24/2021 9:15 A M Medical Record Number: [id number] Patient Account Number: [id number] Date of Birth/Sex: Treating RN: April 29, 1961 (60 y.o. Marcheta Grammes Primary Care Ala Kratz: Billey Gosling Other Clinician: Referring Cru Kritikos: Treating Garek Schuneman/Extender: Lucile Crater Weeks in Treatment: 7 Vital Signs Time Taken: 10:01 Temperature (F): 97.7 Height (in): 73 Pulse (bpm): 106 Weight (lbs): 270 Respiratory Rate (breaths/min): 18 Body Mass Index (BMI): 35.6 Blood Pressure (mmHg): 139/83 Reference Range: 80 - 120 mg / dl Electronic Signature(s) Signed: 07/29/2021 1:46:44 PM By: Sandre Kitty Entered By: Sandre Kitty on 07/24/2021 10:01:56

## 2021-07-29 NOTE — Progress Notes (Signed)
[name], [name] ([id number]) Visit Report for 07/28/2021 Arrival Information Details Patient Name: Date of Service: [name], [name] 07/28/2021 1:30 PM Medical Record Number: [id number] Patient Account Number: [id number] Date of Birth/Sex: Treating RN: [birth year] (60 y.o. [name] Primary Care Nivaan Dicenzo: Billey Gosling Other Clinician: Referring Maydell Knoebel: Treating Kyndell Zeiser/Extender: Mickle Asper in Treatment: 7 Visit Information History Since Last Visit Added or deleted any medications: No Patient Arrived: [name] Any new allergies or adverse reactions: No Arrival Time: 13:07 Had a fall or experienced change in No Accompanied By: self activities of daily living that may affect Transfer Assistance: Manual risk of falls: Patient Identification Verified: Yes Signs or symptoms of abuse/neglect since last visito No Secondary Verification Process Completed: Yes Hospitalized since last visit: No Patient Requires Transmission-Based Precautions: No Implantable device outside of the clinic excluding No Patient Has Alerts: Yes cellular tissue based products placed in the center since last visit: Has Dressing in Place as Prescribed: Yes Has Footwear/Offloading in Place as Prescribed: Yes Left: T Contact Cast otal Pain Present Now: No Electronic Signature(s) Signed: 07/28/2021 5:07:29 PM By: Deon Pilling RN, BSN Entered By: Deon Pilling on 07/28/2021 13:08:17 -------------------------------------------------------------------------------- Clinic Level of Care Assessment Details Patient Name: Date of Service: JHETT, FRETWELL IG 07/28/2021 1:30 PM Medical Record Number: [id number] Patient Account Number: [id number] Date of Birth/Sex: Treating RN: [birth year] (60 y.o. [name] Primary Care [place]: Billey Gosling Other Clinician: Referring Irasema Chalk: Treating Ashvin Adelson/Extender: Mickle Asper in Treatment: 7 Clinic Level of Care Assessment  Items TOOL 4 Quantity Score X- 1 0 Use when only an EandM is performed on FOLLOW-UP visit ASSESSMENTS - Nursing Assessment / Reassessment X- 1 10 Reassessment of Co-morbidities (includes updates in patient status) X- 1 5 Reassessment of Adherence to Treatment Plan ASSESSMENTS - Wound and Skin A ssessment / Reassessment X - Simple Wound Assessment / Reassessment - one wound 1 5 _0  - 0 Complex Wound Assessment / Reassessment - multiple wounds X- 1 10 Dermatologic / Skin Assessment (not related to wound area) ASSESSMENTS - Focused Assessment X- 1 5 Circumferential Edema Measurements - multi extremities X- 1 10 Nutritional Assessment / Counseling / Intervention _1  - 0 Lower Extremity Assessment (monofilament, tuning fork, pulses) _2  - 0 Peripheral Arterial Disease Assessment (using hand held doppler) ASSESSMENTS - Ostomy and/or Continence Assessment and Care _3  - 0 Incontinence Assessment and Management _4  - 0 Ostomy Care Assessment and Management (repouching, etc.) PROCESS - Coordination of Care X - Simple Patient / Family Education for ongoing care 1 15 _5  - 0 Complex (extensive) Patient / Family Education for ongoing care X- 1 10 Staff obtains Programmer, systems, Records, T Results / Process Orders est _6  - 0 Staff telephones HHA, Nursing Homes / Clarify orders / etc _7  - 0 Routine Transfer to another Facility (non-emergent condition) _8  - 0 Routine Hospital Admission (non-emergent condition) _9  - 0 New Admissions / Biomedical engineer / Ordering NPWT Apligraf, etc. , _10  - 0 Emergency Hospital Admission (emergent condition) X- 1 10 Simple Discharge Coordination _11  - 0 Complex (extensive) Discharge Coordination PROCESS - Special Needs _12  - 0 Pediatric / Minor Patient Management _13  - 0 Isolation Patient Management _14  - 0 Hearing / Language / Visual special needs _15  - 0 Assessment of Community assistance (transportation, D/C planning, etc.) _16  - 0 Additional  assistance / Altered mentation _17  - 0 Support Surface(s) Assessment (bed, cushion, seat, etc.) INTERVENTIONS - Wound Cleansing / Measurement X - Simple Wound Cleansing - one wound 1 5 _18  -  0 Complex Wound Cleansing - multiple wounds X- 1 5 Wound Imaging (photographs - any number of wounds) _0  - 0 Wound Tracing (instead of photographs) X- 1 5 Simple Wound Measurement - one wound _1  - 0 Complex Wound Measurement - multiple wounds INTERVENTIONS - Wound Dressings X - Small Wound Dressing one or multiple wounds 1 10 _2  - 0 Medium Wound Dressing one or multiple wounds _3  - 0 Large Wound Dressing one or multiple wounds <KZSWFUXNATFTDDUK>_0<\/URKYHCWCBJSEGBTD>_1  - 0 Application of Medications - topical <VOHYWVPXTGGYIRSW>_5<\/IOEVOJJKKXFGHWEX>_9  - 0 Application of Medications - injection INTERVENTIONS - Miscellaneous _6  - 0 External ear exam _7  - 0 Specimen Collection (cultures, biopsies, blood, body fluids, etc.) _8  - 0 Specimen(s) / Culture(s) sent or taken to Lab for analysis _9  - 0 Patient Transfer (multiple staff / Civil Service fast streamer / Similar devices) _10  - 0 Simple Staple / Suture removal (25 or less) _11  - 0 Complex Staple / Suture removal (26 or more) _12  - 0 Hypo / Hyperglycemic Management (close monitor of Blood Glucose) _13  - 0 Ankle / Brachial Index (ABI) - do not check if billed separately X- 1 5 Vital Signs Has the patient been seen at the hospital within the last three years: Yes Total Score: 110 Level Of Care: New/Established - Level 3 Electronic Signature(s) Signed: 07/28/2021 5:07:29 PM By: Deon Pilling RN, BSN Entered By: Deon Pilling on 07/28/2021 13:52:19 -------------------------------------------------------------------------------- Encounter Discharge Information Details Patient Name: Date of Service: [name] IG 07/28/2021 1:30 PM Medical Record Number: [id number] Patient Account Number: [id number] Date of Birth/Sex: Treating RN: 1961-04-13 (60 y.o. [name] Primary Care Rillie Riffel: Billey Gosling Other Clinician: Referring  Gotham Raden: Treating Lenda Baratta/Extender: Mickle Asper in Treatment: 7 Encounter Discharge Information Items Discharge Condition: Stable Ambulatory Status: Walker Discharge Destination: Home Transportation: Private Auto Accompanied By: self Schedule Follow-up Appointment: Yes Clinical Summary of Care: Electronic Signature(s) Signed: 07/28/2021 5:07:29 PM By: Deon Pilling RN, BSN Entered By: Deon Pilling on 07/28/2021 13:54:13 -------------------------------------------------------------------------------- Lower Extremity Assessment Details Patient Name: Date of Service: [name], [name] IG 07/28/2021 1:30 PM Medical Record Number: [id number] Patient Account Number: [id number] Date of Birth/Sex: Treating RN: December 29, 1960 (60 y.o. [name] Primary Care Suhaas Agena: Billey Gosling Other Clinician: Referring Quay Simkin: Treating Ajani Schnieders/Extender: Lucile Crater Weeks in Treatment: 7 Edema Assessment Assessed: [Left: Yes] [Right: No] Edema: [Left: Ye] [Right: s] Calf Left: Right: Point of Measurement: 36 cm From Medial Instep 48 cm Ankle Left: Right: Point of Measurement: 13 cm From Medial Instep 29 cm Vascular Assessment Pulses: Dorsalis Pedis Palpable: [Left:Yes] Electronic Signature(s) Signed: 07/28/2021 5:07:29 PM By: Deon Pilling RN, BSN Entered By: Deon Pilling on 07/28/2021 13:31:08 -------------------------------------------------------------------------------- Multi Wound Chart Details Patient Name: Date of Service: [name] IG 07/28/2021 1:30 PM Medical Record Number: [id number] Patient Account Number: [id number] Date of Birth/Sex: Treating RN: [birth year] (60 y.o. [name] Primary Care Carolynne Schuchard: Billey Gosling Other Clinician: Referring Tally Mckinnon: Treating Galadriel Shroff/Extender: Lucile Crater Weeks in Treatment: 7 Vital Signs Height(in): 73 Capillary Blood Glucose(mg/dl): 147 Weight(lbs):  270 Pulse(bpm): 36 Body Mass Index(BMI): 36 Blood Pressure(mmHg): 129/67 Temperature(F): 97.4 Respiratory Rate(breaths/min): 20 Photos: [N/A:N/A] Left, Plantar Foot N/A N/A Wound Location: Gradually Appeared N/A N/A Wounding Event: Diabetic Wound/Ulcer of the Lower N/A N/A Primary Etiology: Extremity Anemia, Sleep Apnea, Congestive N/A N/A Comorbid History: Heart Failure, Hypertension, Peripheral Venous Disease, Cirrhosis , Type II Diabetes, Osteoarthritis, Neuropathy 03/18/2021 N/A N/A Date Acquired: 7 N/A N/A Weeks of Treatment: Open N/A N/A Wound Status: 1.5x2.5x0.1 N/A N/A Measurements L  x W x D (cm) 2.945 N/A N/A A (cm) : rea 0.295 N/A N/A Volume (cm) : 58.30% N/A N/A % Reduction in A rea: 58.30% N/A N/A % Reduction in Volume: Grade 3 N/A N/A Classification: Medium N/A N/A Exudate A mount: Serosanguineous N/A N/A Exudate Type: red, brown N/A N/A Exudate Color: Distinct, outline attached N/A N/A Wound Margin: Large (67-100%) N/A N/A Granulation A mount: Red N/A N/A Granulation Quality: None Present (0%) N/A N/A Necrotic A mount: Fat Layer (Subcutaneous Tissue): Yes N/A N/A Exposed Structures: Fascia: No Tendon: No Muscle: No Joint: No Bone: No Small (1-33%) N/A N/A Epithelialization: callous and maceration present to N/A N/A Assessment Notes: periwound. Treatment Notes Wound #3 (Foot) Wound Laterality: Plantar, Left Cleanser Soap and Water Discharge Instruction: May shower and wash wound with dial antibacterial soap and water prior to dressing change. Wound Cleanser Discharge Instruction: Cleanse the wound with wound cleanser prior to applying a clean dressing using gauze sponges, not tissue or cotton balls. Peri-Wound Care Zinc Oxide Ointment 30g tube Discharge Instruction: Apply Zinc Oxide to periwound with each dressing change Topical Primary Dressing Hydrofera Blue Ready Foam, 2.5 x2.5 in Discharge Instruction: Apply to wound bed  as instructed Secondary Dressing ABD Pad, 5x9 Discharge Instruction: Apply over primary dressing as directed. Zetuvit Plus 4x8 in Discharge Instruction: Apply over primary dressing as directed. Secured With [employer], 4.5x3.1 (in/yd) Discharge Instruction: Secure with Kerlix as directed. 32M Medipore H Soft Cloth Surgical T ape, 2x2 (in/yd) Discharge Instruction: Secure dressing with tape as directed. Compression Wrap Compression Stockings Add-Ons Electronic Signature(s) Signed: 07/28/2021 3:14:29 PM By: Kalman Shan DO Signed: 07/28/2021 5:07:29 PM By: Deon Pilling RN, BSN Entered By: Kalman Shan on 07/28/2021 15:11:06 -------------------------------------------------------------------------------- Multi-Disciplinary Care Plan Details Patient Name: Date of Service: [name], [name] IG 07/28/2021 1:30 PM Medical Record Number: [id number] Patient Account Number: [id number] Date of Birth/Sex: Treating RN: [birth year] (60 y.o. [name] Primary Care Zakaiya Lares: Billey Gosling Other Clinician: Referring Mccall Lomax: Treating Supreme Rybarczyk/Extender: Mickle Asper in Treatment: 7 Multidisciplinary Care Plan reviewed with physician Active Inactive HBO Nursing Diagnoses: Anxiety related to feelings of confinement associated with the hyperbaric oxygen chamber Anxiety related to knowledge deficit of hyperbaric oxygen therapy and treatment procedures Discomfort related to temperature and humidity changes inside hyperbaric chamber Potential for barotraumas to ears, sinuses, teeth, and lungs or cerebral gas embolism related to changes in atmospheric pressure inside hyperbaric oxygen chamber Potential for oxygen toxicity seizures related to delivery of 100% oxygen at an increased atmospheric pressure Potential for pulmonary oxygen toxicity related to delivery of 100% oxygen at an increased atmospheric pressure Goals: Barotrauma will be prevented during  HBO2 Date Initiated: 06/04/2021 Target Resolution Date: 08/14/2021 Goal Status: Active Patient and/or family will be able to state/discuss factors appropriate to the management of their disease process during treatment Date Initiated: 06/04/2021 Target Resolution Date: 08/14/2021 Goal Status: Active Patient will tolerate the hyperbaric oxygen therapy treatment Date Initiated: 06/04/2021 Target Resolution Date: 08/14/2021 Goal Status: Active Patient will tolerate the internal climate of the chamber Date Initiated: 06/04/2021 Target Resolution Date: 08/14/2021 Goal Status: Active Patient/caregiver will verbalize understanding of HBO goals, rationale, procedures and potential hazards Date Initiated: 06/04/2021 Date Inactivated: 07/28/2021 Target Resolution Date: 08/14/2021 Goal Status: Met Signs and symptoms of pulmonary oxygen toxicity will be recognized and promptly addressed Date Initiated: 06/04/2021 Target Resolution Date: 08/14/2021 Goal Status: Active Signs and symptoms of seizure will be recognized and promptly addressed ; seizing patients will suffer no harm Date  Initiated: 06/04/2021 Target Resolution Date: 08/14/2021 Goal Status: Active Interventions: Administer a five (5) minute air break for patient if signs and symptoms of seizure appear and notify the hyperbaric physician Administer decongestants, per physician orders, prior to HBO2 Administer the correct therapeutic gas delivery based on the patients needs and limitations, per physician order Assess and provide for patients comfort related to the hyperbaric environment and equalization of middle ear Assess for signs and symptoms related to adverse events, including but not limited to confinement anxiety, pneumothorax, oxygen toxicity and baurotrauma Assess patient for any history of confinement anxiety Assess patient's knowledge and expectations regarding hyperbaric medicine and provide education related to the hyperbaric  environment, goals of treatment and prevention of adverse events Implement protocols to decrease risk of pneumothorax in high risk patients Notes: Nutrition Nursing Diagnoses: Impaired glucose control: actual or potential Potential for alteratiion in Nutrition/Potential for imbalanced nutrition Goals: Patient/caregiver agrees to and verbalizes understanding of need to use nutritional supplements and/or vitamins as prescribed Date Initiated: 06/04/2021 Date Inactivated: 07/07/2021 Target Resolution Date: 07/04/2021 Goal Status: Met Patient/caregiver will maintain therapeutic glucose control Date Initiated: 06/04/2021 Target Resolution Date: 08/04/2021 Goal Status: Active Interventions: Assess HgA1c results as ordered upon admission and as needed Assess patient nutrition upon admission and as needed per policy Provide education on elevated blood sugars and impact on wound healing Notes: 07/07/21: Glucose control ongoing Electronic Signature(s) Signed: 07/28/2021 5:07:29 PM By: Deon Pilling RN, BSN Entered By: Deon Pilling on 07/28/2021 13:33:12 -------------------------------------------------------------------------------- Pain Assessment Details Patient Name: Date of Service: [name] IG 07/28/2021 1:30 PM Medical Record Number: [id number] Patient Account Number: [id number] Date of Birth/Sex: Treating RN: 1961/04/02 (60 y.o. [name] Primary Care Berklee Battey: Billey Gosling Other Clinician: Referring Saben Donigan: Treating Myan Suit/Extender: Mickle Asper in Treatment: 7 Active Problems Location of Pain Severity and Description of Pain Patient Has Paino No Site Locations Rate the pain. Current Pain Level: 0 Pain Management and Medication Current Pain Management: Medication: No Cold Application: No Rest: No Massage: No Activity: No T.E.N.S.: No Heat Application: No Leg drop or elevation: No Is the Current Pain Management Adequate:  Adequate How does your wound impact your activities of daily livingo Sleep: No Bathing: No Appetite: No Relationship With Others: No Bladder Continence: No Emotions: No Bowel Continence: No Work: No Toileting: No Drive: No Dressing: No Hobbies: No Engineer, maintenance) Signed: 07/28/2021 5:07:29 PM By: Deon Pilling RN, BSN Entered By: Deon Pilling on 07/28/2021 13:24:27 -------------------------------------------------------------------------------- Patient/Caregiver Education Details Patient Name: Date of Service: [name] 10/11/2022andnbsp1:30 PM Medical Record Number: [id number] Patient Account Number: [id number] Date of Birth/Gender: Treating RN: 1961-08-20 (60 y.o. [name] Primary Care Physician: Billey Gosling Other Clinician: Referring Physician: Treating Physician/Extender: Mickle Asper in Treatment: 7 Education Assessment Education Provided To: Patient Education Topics Provided Elevated Blood Sugar/ Impact on Healing: Handouts: Elevated Blood Sugars: How Do They Affect Wound Healing Methods: Explain/Verbal Responses: Reinforcements needed Electronic Signature(s) Signed: 07/28/2021 5:07:29 PM By: Deon Pilling RN, BSN Entered By: Deon Pilling on 07/28/2021 13:33:24 -------------------------------------------------------------------------------- Wound Assessment Details Patient Name: Date of Service: [name] IG 07/28/2021 1:30 PM Medical Record Number: [id number] Patient Account Number: [id number] Date of Birth/Sex: Treating RN: 1961/05/16 (60 y.o. [name] Primary Care Vencil Basnett: Billey Gosling Other Clinician: Referring Tyteanna Ost: Treating Yvett Rossel/Extender: Lucile Crater Weeks in Treatment: 7 Wound Status Wound Number: 3 Primary Diabetic Wound/Ulcer of the Lower Extremity Etiology: Wound Location: Left, Plantar Foot Wound Open Wounding Event: Gradually Appeared  Status: Date  Acquired: 03/18/2021 Comorbid Anemia, Sleep Apnea, Congestive Heart Failure, Hypertension, Weeks Of Treatment: 7 History: Peripheral Venous Disease, Cirrhosis , Type II Diabetes, Clustered Wound: No Osteoarthritis, Neuropathy Photos Wound Measurements Length: (cm) 1.5 Width: (cm) 2.5 Depth: (cm) 0.1 Area: (cm) 2.945 Volume: (cm) 0.295 % Reduction in Area: 58.3% % Reduction in Volume: 58.3% Epithelialization: Small (1-33%) Tunneling: No Undermining: No Wound Description Classification: Grade 3 Wound Margin: Distinct, outline attached Exudate Amount: Medium Exudate Type: Serosanguineous Exudate Color: red, brown Foul Odor After Cleansing: No Slough/Fibrino No Wound Bed Granulation Amount: Large (67-100%) Exposed Structure Granulation Quality: Red Fascia Exposed: No Necrotic Amount: None Present (0%) Fat Layer (Subcutaneous Tissue) Exposed: Yes Tendon Exposed: No Muscle Exposed: No Joint Exposed: No Bone Exposed: No Assessment Notes callous and maceration present to periwound. Treatment Notes Wound #3 (Foot) Wound Laterality: Plantar, Left Cleanser Soap and Water Discharge Instruction: May shower and wash wound with dial antibacterial soap and water prior to dressing change. Wound Cleanser Discharge Instruction: Cleanse the wound with wound cleanser prior to applying a clean dressing using gauze sponges, not tissue or cotton balls. Peri-Wound Care Zinc Oxide Ointment 30g tube Discharge Instruction: Apply Zinc Oxide to periwound with each dressing change Topical Primary Dressing Hydrofera Blue Ready Foam, 2.5 x2.5 in Discharge Instruction: Apply to wound bed as instructed Secondary Dressing ABD Pad, 5x9 Discharge Instruction: Apply over primary dressing as directed. Zetuvit Plus 4x8 in Discharge Instruction: Apply over primary dressing as directed. Secured With [employer], 4.5x3.1 (in/yd) Discharge Instruction: Secure with Kerlix as directed. 3M  Medipore H Soft Cloth Surgical T ape, 2x2 (in/yd) Discharge Instruction: Secure dressing with tape as directed. Compression Wrap Compression Stockings Add-Ons Electronic Signature(s) Signed: 07/28/2021 5:07:29 PM By: Deon Pilling RN, BSN Signed: 07/29/2021 1:46:44 PM By: Sandre Kitty Entered By: Sandre Kitty on 07/28/2021 13:31:43 -------------------------------------------------------------------------------- Vitals Details Patient Name: Date of Service: [name], [name] IG 07/28/2021 1:30 PM Medical Record Number: [id number] Patient Account Number: [id number] Date of Birth/Sex: Treating RN: 1961/01/27 (60 y.o. [name] Primary Care Rissa Turley: Billey Gosling Other Clinician: Referring Tytus Strahle: Treating Marissa Lowrey/Extender: Lucile Crater Weeks in Treatment: 7 Vital Signs Time Taken: 12:52 Temperature (F): 97.4 Height (in): 73 Pulse (bpm): 96 Weight (lbs): 270 Respiratory Rate (breaths/min): 20 Weight (lbs): 270 Respiratory Rate (breaths/min): 20 Body Mass Index (BMI): 35.6 Blood Pressure (mmHg): 129/67 Capillary Blood Glucose (mg/dl): 147 Reference Range: 80 - 120 mg / dl Notes Perform in Hyberbarics. See hyberbaric note. Electronic Signature(s) Signed: 07/28/2021 1:19:58 PM By: Donavan Burnet EMT Entered By: Donavan Burnet on 07/28/2021 13:19:58

## 2021-07-30 ENCOUNTER — Other Ambulatory Visit: Payer: Self-pay

## 2021-07-30 ENCOUNTER — Encounter (HOSPITAL_BASED_OUTPATIENT_CLINIC_OR_DEPARTMENT_OTHER): Payer: Medicare Other | Admitting: Internal Medicine

## 2021-07-30 DIAGNOSIS — E11621 Type 2 diabetes mellitus with foot ulcer: Secondary | ICD-10-CM | POA: Diagnosis not present

## 2021-07-30 LAB — GLUCOSE, CAPILLARY
Glucose-Capillary: 182 mg/dL — ABNORMAL HIGH (ref 70–99)
Glucose-Capillary: 124 mg/dL — ABNORMAL HIGH (ref 70–99)

## 2021-07-30 NOTE — Progress Notes (Signed)
TAI, SKELLY ([id number]) Visit Report for 07/30/2021 SuperBill Details Patient Name: Date of Service: YOUSIF, EDELSON 07/30/2021 Medical Record Number: [id number] Patient Account Number: [id number] Date of Birth/Sex: Treating RN: April 15, 1961 (60 y.o. [name], [name] Primary Care Provider: [name] Other Clinician: Haywood Pao Referring Provider: Treating Provider/Extender: Avie Echevaria in Treatment: 8 Diagnosis Coding ICD-10 Codes Code Description [phone] Chronic multifocal osteomyelitis, left ankle and foot E11.621 Type 2 diabetes mellitus with foot ulcer M14.672 Charcot's joint, left ankle and foot L97.528 Non-pressure chronic ulcer of other part of left foot with other specified severity Facility Procedures CPT4 Code Description Modifier Quantity [id number]-(Facility Use Only) HBOT full body chamber, , 4 ICD-10 Diagnosis Description M86.372 Chronic multifocal osteomyelitis, left ankle and foot E11.621 Type 2 diabetes mellitus with foot ulcer Physician Procedures Quantity CPT4 Code Description Modifier [id number] - WC PHYS HYPERBARIC OXYGEN THERAPY 1 ICD-10 Diagnosis Description M86.372 Chronic multifocal osteomyelitis, left ankle and foot E11.621 Type 2 diabetes mellitus with foot ulcer Electronic Signature(s) Signed: 07/30/2021 1:30:48 PM By: Haywood Pao EMT Signed: 07/30/2021 4:49:53 PM By: Baltazar Najjar MD Entered By: Haywood Pao on 07/30/2021 13:30:48

## 2021-07-30 NOTE — Progress Notes (Addendum)
SAYVION, VIGEN ([id number]) Visit Report for 07/30/2021 Arrival Information Details Patient Name: Date of Service: GREER, KOEPPEN 07/30/2021 10:00 A M Medical Record Number: [id number] Patient Account Number: [id number] Date of Birth/Sex: Treating RN: [birth year] (60 y.o. [name], [name] Primary Care Shanta Dorvil: [name] Other Clinician: Haywood Pao Referring Taliesin Hartlage: Treating Bayan Kushnir/Extender: Avie Echevaria in Treatment: 8 Visit Information History Since Last Visit All ordered tests and consults were completed: Yes Patient Arrived: [name] Added or deleted any medications: No Arrival Time: 10:01 Any new allergies or adverse reactions: No Accompanied By: self Had a fall or experienced change in No Transfer Assistance: None activities of daily living that may affect Patient Identification Verified: Yes risk of falls: Secondary Verification Process Completed: Yes Signs or symptoms of abuse/neglect since last visito No Patient Requires Transmission-Based Precautions: No Hospitalized since last visit: No Patient Has Alerts: Yes Implantable device outside of the clinic excluding No cellular tissue based products placed in the center since last visit: Pain Present Now: No Electronic Signature(s) Signed: 07/30/2021 11:24:08 AM By: Haywood Pao EMT Entered By: Haywood Pao on 07/30/2021 11:24:08 -------------------------------------------------------------------------------- Vitals Details Patient Name: Date of Service: [name] 07/30/2021 10:00 A M Medical Record Number: [id number] Patient Account Number: [id number] Date of Birth/Sex: Treating RN: [birth year] (60 y.o. [name] Primary Care Cynda Soule: [name] Other Clinician: Haywood Pao Referring Meha Vidrine: Treating Daphna Lafuente/Extender: Avie Echevaria in Treatment: 8 Vital Signs Time Taken: 10:10 Temperature (F): 98.1 Weight (lbs): 270 Pulse  (bpm): 100 Respiratory Rate (breaths/min): 20 Blood Pressure (mmHg): 141/73 Capillary Blood Glucose (mg/dl): 458 Reference Range: 80 - 120 mg / dl Electronic Signature(s) Signed: 07/30/2021 11:25:56 AM By: Haywood Pao EMT Entered By: Haywood Pao on 07/30/2021 11:25:55

## 2021-07-30 NOTE — Progress Notes (Addendum)
[name], [name] ([id number]) Visit Report for 07/30/2021 HBO Details Patient Name: Date of Service: [name], [name] 07/30/2021 10:00 A M Medical Record Number: [id number] Patient Account Number: [id number] Date of Birth/Sex: Treating RN: 1960/10/24 (60 y.o. [name] Primary Care [name]: [name] Other Clinician: Haywood [name] Referring [name]: Treating Chantilly Linskey/Extender: [name] in Treatment: 8 HBO Treatment Course Details Treatment Course Number: 1 Ordering [name]: [name] T Treatments Ordered: otal 40 HBO Treatment Start Date: 07/07/2021 HBO Indication: Chronic Refractory Osteomyelitis to Left,Plantar Foot HBO Treatment Details Treatment Number: 15 Patient Type: Outpatient Chamber Type: Monoplace Chamber Serial #: [id number] Treatment Protocol: 2.5 ATA with 90 minutes oxygen, with two 5 minute air breaks Treatment Details Compression Rate Down: 2.0 psi / minute De-Compression Rate Up: 2.0 psi / minute A breaks and breathing ir Compress Tx Pressure periods Decompress Decompress Begins Reached (leave unused spaces Begins Ends blank) Chamber Pressure (ATA 1 2.5 2.5 2.5 2.5 2.5 - - 2.5 1 ) Clock Time (24 hr) 10:44 10:55 11:25 11:30 12:00 12:05 - - 12:35 12:46 Treatment Length: 122 (minutes) Treatment Segments: 4 Vital Signs Capillary Blood Glucose Reference Range: 80 - 120 mg / dl HBO Diabetic Blood Glucose Intervention Range: <131 mg/dl or >242 mg/dl Type: Time Vitals Blood Pulse: Respiratory Temperature: Capillary Blood Glucose Pulse Action Taken: Pressure: Rate: Glucose (mg/dl): Meter #: Oximetry (%) Taken: Pre 10:10 141/73 100 20 98.1 182 Post 12:47 145/78 93 18 98.1 124 discharge per protocol >101 mg/dL Treatment Response Treatment Toleration: Well Treatment Completion Status: Treatment Completed without Adverse Event [name] Notes No concerns with treatment given Physician HBO Attestation: I certify that I  supervised this HBO treatment in accordance with Medicare guidelines. A trained emergency response team is readily available per Yes hospital policies and procedures. Continue HBOT as ordered. Yes Electronic Signature(s) Signed: 07/30/2021 4:49:53 PM By: [name] Najjar MD Previous Signature: 07/30/2021 1:29:41 PM Version By: [name] EMT Entered By: [name] on 07/30/2021 16:42:15 -------------------------------------------------------------------------------- HBO Safety Checklist Details Patient Name: Date of Service: [name] IG 07/30/2021 10:00 A M Medical Record Number: [id number] Patient Account Number: [id number] Date of Birth/Sex: Treating RN: 03-04-1961 (60 y.o. [name], [name] Primary Care Jaaron Oleson: [name] Other Clinician: Haywood [name] Referring Miriana Gaertner: Treating Romney Compean/Extender: [name] in Treatment: 8 HBO Safety Checklist Items Safety Checklist Consent Form Signed Patient voided / foley secured and emptied When did you last eato snack this AM Last dose of injectable or oral agent n/a Ostomy pouch emptied and vented if applicable NA All implantable devices assessed, documented and approved NA Intravenous access site secured and place NA Valuables secured NA Linens and cotton and cotton/polyester blend (less than 51% polyester) Personal oil-based products / skin lotions / body lotions removed Wigs or hairpieces removed NA Smoking or tobacco materials removed NA Books / newspapers / magazines / loose paper removed Cologne, aftershave, perfume and deodorant removed Jewelry removed (may wrap wedding band) Make-up removed NA Hair care products removed NA Battery operated devices (external) removed Heating patches and chemical warmers removed Titanium eyewear removed NA Nail polish cured greater than 10 hours NA Casting material cured greater than 10 hours NA Hearing aids removed NA Loose  dentures or partials removed NA Prosthetics have been removed NA Patient demonstrates correct use of air break device (if applicable) Patient concerns have been addressed Patient grounding bracelet on and cord attached to chamber Specifics for Inpatients (complete in addition to above) Medication sheet sent with patient NA Intravenous  medications needed or due during therapy sent with patient NA Drainage tubes (e.g. nasogastric tube or chest tube secured and vented) NA Endotracheal or Tracheotomy tube secured NA Cuff deflated of air and inflated with saline NA Airway suctioned NA Electronic Signature(s) Signed: 07/30/2021 11:27:36 AM By: [name] EMT Entered By: [name] on 07/30/2021 11:27:36

## 2021-07-31 ENCOUNTER — Encounter (HOSPITAL_BASED_OUTPATIENT_CLINIC_OR_DEPARTMENT_OTHER): Payer: Medicare Other | Admitting: Internal Medicine

## 2021-07-31 ENCOUNTER — Encounter (HOSPITAL_BASED_OUTPATIENT_CLINIC_OR_DEPARTMENT_OTHER): Payer: Self-pay

## 2021-07-31 DIAGNOSIS — E11621 Type 2 diabetes mellitus with foot ulcer: Secondary | ICD-10-CM | POA: Diagnosis not present

## 2021-07-31 LAB — GLUCOSE, CAPILLARY
Glucose-Capillary: 154 mg/dL — ABNORMAL HIGH (ref 70–99)
Glucose-Capillary: 163 mg/dL — ABNORMAL HIGH (ref 70–99)

## 2021-07-31 NOTE — Progress Notes (Addendum)
[name] ([id number]) Visit Report for 07/31/2021 Arrival Information Details Patient Name: Date of Service: [name], [name] 07/31/2021 8:15 A M Medical Record Number: [id number] Patient Account Number: [id number] Date of Birth/Sex: Treating RN: August 18, 1961 (60 y.o. [name] Primary Care [name]: [name] Other Clinician: Haywood [name] Referring [name]: Treating [name]/Extender: [name] in Treatment: 8 Visit Information History Since Last Visit All ordered tests and consults were completed: Yes Patient Arrived: [name] Added or deleted any medications: No Arrival Time: 08:48 Any new allergies or adverse reactions: No Accompanied By: self Had a fall or experienced change in No Transfer Assistance: None activities of daily living that may affect Patient Identification Verified: Yes risk of falls: Secondary Verification Process Completed: Yes Signs or symptoms of abuse/neglect since last visito No Patient Requires Transmission-Based Precautions: No Hospitalized since last visit: No Patient Has Alerts: Yes Implantable device outside of the clinic excluding No cellular tissue based products placed in the center since last visit: Has Dressing in Place as Prescribed: Yes Has Compression in Place as Prescribed: Yes Has Footwear/Offloading in Place as Prescribed: Yes Left: Wedge Shoe Pain Present Now: No Electronic Signature(s) Signed: 07/31/2021 12:10:37 PM By: [name] EMT Entered By: [name] on 07/31/2021 12:10:36 -------------------------------------------------------------------------------- Encounter Discharge Information Details Patient Name: Date of Service: [name] 07/31/2021 8:15 A M Medical Record Number: [id number] Patient Account Number: [id number] Date of Birth/Sex: Treating RN: 05-25-1961 (60 y.o. [name] Primary Care Shaketta Rill: [name] Other Clinician: Haywood [name] Referring [name]: Treating [name]/Extender: [name] in Treatment: 8 Encounter Discharge Information Items Discharge Condition: Stable Ambulatory Status: Walker Discharge Destination: Other (Note Required) Transportation: Other Accompanied By: staff Schedule Follow-up Appointment: No Clinical Summary of Care: Notes Patient has wound care encounter after treatment Electronic Signature(s) Signed: 07/31/2021 12:16:11 PM By: [name] EMT Entered By: [name] on 07/31/2021 12:16:11 -------------------------------------------------------------------------------- Vitals Details Patient Name: Date of Service: [name] 07/31/2021 8:15 A M Medical Record Number: [id number] Patient Account Number: [id number] Date of Birth/Sex: Treating RN: 12-Oct-1961 (60 y.o. [name] Primary Care [name]: [name] Other Clinician: Haywood [name] Referring [name]: Treating [name]/Extender: [name] in Treatment: 8 Vital Signs Time Taken: 08:58 Temperature (F): 97.6 Weight (lbs): 270 Pulse (bpm): 98 Respiratory Rate (breaths/min): 18 Blood Pressure (mmHg): 121/65 Capillary Blood Glucose (mg/dl): 748 Reference Range: 80 - 120 mg / dl Electronic Signature(s) Signed: 07/31/2021 12:11:22 PM By: [name] EMT Entered By: [name] on 07/31/2021 12:11:22

## 2021-07-31 NOTE — Progress Notes (Addendum)
SALIF, TAY ([id number]) Visit Report for 07/31/2021 HBO Details Patient Name: Date of Service: [name], [name] 07/31/2021 8:15 A M Medical Record Number: [id number] Patient Account Number: [id number] Date of Birth/Sex: Treating RN: 1961/10/07 (60 y.o. [name] Primary Care Aimee Timmons: [name] Other Clinician: Haywood Pao Referring Tanetta Fuhriman: Treating Diego Delancey/Extender: Avie Echevaria in Treatment: 8 HBO Treatment Course Details Treatment Course Number: 1 Ordering Mahaley Schwering: Baltazar Najjar T Treatments Ordered: otal 40 HBO Treatment Start Date: 07/07/2021 HBO Indication: Chronic Refractory Osteomyelitis to Left,Plantar Foot HBO Treatment Details Treatment Number: 16 Patient Type: Outpatient Chamber Type: Monoplace Chamber Serial #: [id number] Treatment Protocol: 2.5 ATA with 90 minutes oxygen, with two 5 minute air breaks Treatment Details Compression Rate Down: 2.0 psi / minute De-Compression Rate Up: 2.0 psi / minute A breaks and breathing ir Compress Tx Pressure periods Decompress Decompress Begins Reached (leave unused spaces Begins Ends blank) Chamber Pressure (ATA 1 2.5 2.5 2.5 2.5 2.5 - - 2.5 1 ) Clock Time (24 hr) 09:26 09:46 10:18 10:23 10:53 10:58 - - 11:28 11:39 Treatment Length: 133 (minutes) Treatment Segments: 4 Vital Signs Capillary Blood Glucose Reference Range: 80 - 120 mg / dl HBO Diabetic Blood Glucose Intervention Range: <131 mg/dl or >314 mg/dl Time Vitals Blood Respiratory Capillary Blood Glucose Pulse Action Type: Pulse: Temperature: Taken: Pressure: Rate: Glucose (mg/dl): Meter #: Oximetry (%) Taken: Pre 08:58 121/65 98 18 97.6 165 2 Post 12:06 111/70 63 18 98.6 163 2 Treatment Response Treatment Toleration: Well Treatment Completion Status: Treatment Completed without Adverse Event Erhard Senske Notes No concerns with treatment given Physician HBO Attestation: I certify that I supervised this HBO treatment  in accordance with Medicare guidelines. A trained emergency response team is readily available per Yes hospital policies and procedures. Continue HBOT as ordered. Yes Electronic Signature(s) Signed: 07/31/2021 12:39:46 PM By: Baltazar Najjar MD Previous Signature: 07/31/2021 12:13:58 PM Version By: Haywood Pao EMT Entered By: Baltazar Najjar on 07/31/2021 12:38:50 -------------------------------------------------------------------------------- HBO Safety Checklist Details Patient Name: Date of Service: [name], [name] IG 07/31/2021 8:15 A M Medical Record Number: [id number] Patient Account Number: [id number] Date of Birth/Sex: Treating RN: [birth year] (60 y.o. [name] Primary Care Devika Dragovich: [name] Other Clinician: Haywood Pao Referring Sohaib Vereen: Treating Taveon Enyeart/Extender: Avie Echevaria in Treatment: 8 HBO Safety Checklist Items Safety Checklist Consent Form Signed Patient voided / foley secured and emptied When did you last eato 0900 Last dose of injectable or oral agent n/a Ostomy pouch emptied and vented if applicable NA All implantable devices assessed, documented and approved NA Intravenous access site secured and place NA Valuables secured Linens and cotton and cotton/polyester blend (less than 51% polyester) Personal oil-based products / skin lotions / body lotions removed Wigs or hairpieces removed NA Smoking or tobacco materials removed NA Books / newspapers / magazines / loose paper removed Cologne, aftershave, perfume and deodorant removed Jewelry removed (may wrap wedding band) Make-up removed NA Hair care products removed Hair Elastic Battery operated devices (external) removed NA Heating patches and chemical warmers removed Titanium eyewear removed NA Nail polish cured greater than 10 hours NA Casting material cured greater than 10 hours NA Hearing aids removed NA Loose dentures or partials  removed NA Prosthetics have been removed NA Patient demonstrates correct use of air break device (if applicable) Patient concerns have been addressed Patient grounding bracelet on and cord attached to chamber Specifics for Inpatients (complete in addition to above) Medication sheet sent with patient NA Intravenous medications needed or due  during therapy sent with patient NA Drainage tubes (e.g. nasogastric tube or chest tube secured and vented) NA Endotracheal or Tracheotomy tube secured NA Cuff deflated of air and inflated with saline NA Airway suctioned NA Notes Paper version used prior to treatment. Electronic Signature(s) Signed: 07/31/2021 12:14:56 PM By: Haywood Pao EMT Previous Signature: 07/31/2021 12:12:39 PM Version By: Haywood Pao EMT Entered By: Haywood Pao on 07/31/2021 12:14:55

## 2021-07-31 NOTE — Progress Notes (Signed)
[name], [name] ([id number]) Visit Report for 07/31/2021 Arrival Information Details Patient Name: Date of Service: [name], [name] 07/31/2021 10:45 A M Medical Record Number: [id number] Patient Account Number: [id number] Date of Birth/Sex: Treating RN: August 15, 1961 (60 y.o. [name], [name] Primary Care Bracken Moffa: Billey Gosling Other Clinician: Referring Celedonio Sortino: Treating Bedelia Pong/Extender: Genella Rife in Treatment: 8 Visit Information History Since Last Visit Added or deleted any medications: No Patient Arrived: [name] Any new allergies or adverse reactions: No Arrival Time: 12:06 Had a fall or experienced change in No Accompanied By: self activities of daily living that may affect Transfer Assistance: None risk of falls: Patient Identification Verified: Yes Signs or symptoms of abuse/neglect since last visito No Secondary Verification Process Completed: Yes Hospitalized since last visit: No Patient Requires Transmission-Based Precautions: No Implantable device outside of the clinic excluding No Patient Has Alerts: Yes cellular tissue based products placed in the center since last visit: Has Dressing in Place as Prescribed: Yes Pain Present Now: No Electronic Signature(s) Signed: 07/31/2021 1:42:09 PM By: Deon Pilling RN, BSN Entered By: Deon Pilling on 07/31/2021 12:06:43 -------------------------------------------------------------------------------- Compression Therapy Details Patient Name: Date of Service: [name] 07/31/2021 10:45 A M Medical Record Number: [id number] Patient Account Number: [id number] Date of Birth/Sex: Treating RN: 1961/07/09 (60 y.o. [name] Primary Care Samari Gorby: Billey Gosling Other Clinician: Referring Quiera Diffee: Treating Akaylah Lalley/Extender: Genella Rife in Treatment: 8 Compression Therapy Performed for Wound Assessment: Wound #3 Left,Plantar Foot Performed By: Clinician Deon Pilling,  RN Compression Type: Four Layer Post Procedure Diagnosis Same as Pre-procedure Electronic Signature(s) Signed: 07/31/2021 1:42:09 PM By: Deon Pilling RN, BSN Entered By: Deon Pilling on 07/31/2021 12:14:35 -------------------------------------------------------------------------------- Encounter Discharge Information Details Patient Name: Date of Service: [name] 07/31/2021 10:45 A M Medical Record Number: [id number] Patient Account Number: [id number] Date of Birth/Sex: Treating RN: 07-18-1961 (60 y.o. [name] Primary Care Sima Lindenberger: Billey Gosling Other Clinician: Referring Shatera Rennert: Treating Sylvio Weatherall/Extender: Genella Rife in Treatment: 8 Encounter Discharge Information Items Discharge Condition: Stable Ambulatory Status: Walker Discharge Destination: Home Transportation: Private Auto Accompanied By: self Schedule Follow-up Appointment: Yes Clinical Summary of Care: Electronic Signature(s) Signed: 07/31/2021 1:42:09 PM By: Deon Pilling RN, BSN Entered By: Deon Pilling on 07/31/2021 12:22:49 -------------------------------------------------------------------------------- Lower Extremity Assessment Details Patient Name: Date of Service: [name] [name] 07/31/2021 10:45 A M Medical Record Number: [id number] Patient Account Number: [id number] Date of Birth/Sex: Treating RN: 02-20-1961 (60 y.o. [name] Primary Care Natale Barba: Billey Gosling Other Clinician: Referring Haskell Rihn: Treating Saleem Coccia/Extender: Genella Rife in Treatment: 8 Edema Assessment Assessed: [name]: Yes] [Right: No] Edema: [Left: Ye] [Right: s] Calf Left: Right: Point of Measurement: 36 cm From Medial Instep 48 cm Ankle Left: Right: Point of Measurement: 13 cm From Medial Instep 27 cm Vascular Assessment Pulses: Dorsalis Pedis Palpable: [Left:Yes] Electronic Signature(s) Signed: 07/31/2021 1:42:09 PM By: Deon Pilling RN,  BSN Entered By: Deon Pilling on 07/31/2021 12:07:42 -------------------------------------------------------------------------------- Multi Wound Chart Details Patient Name: Date of Service: [name] 07/31/2021 10:45 A M Medical Record Number: [id number] Patient Account Number: [id number] Date of Birth/Sex: Treating RN: May 18, 1961 (60 y.o. M) Primary Care Horst Ostermiller: Billey Gosling Other Clinician: Referring Charlee Whitebread: Treating Garold Sheeler/Extender: Genella Rife in Treatment: 8 Vital Signs Height(in): Capillary Blood Glucose(mg/dl): 163 Weight(lbs): 270 Pulse(bpm): 14 Body Mass Index(BMI): Blood Pressure(mmHg): 111/70 Temperature(F): 98.6 Respiratory Rate(breaths/min): 18 Photos: [N/A:N/A] Left, Plantar Foot N/A N/A Wound Location: Gradually Appeared N/A N/A Wounding Event: Diabetic Wound/Ulcer of  the Lower N/A N/A Primary Etiology: Extremity Anemia, Sleep Apnea, Congestive N/A N/A Comorbid History: Heart Failure, Hypertension, Peripheral Venous Disease, Cirrhosis , Type II Diabetes, Osteoarthritis, Neuropathy 03/18/2021 N/A N/A Date Acquired: 8 N/A N/A Weeks of Treatment: Open N/A N/A Wound Status: 1.6x2.9x0.2 N/A N/A Measurements L x W x D (cm) 3.644 N/A N/A A (cm) : rea 0.729 N/A N/A Volume (cm) : 48.50% N/A N/A % Reduction in A rea: -3.10% N/A N/A % Reduction in Volume: Grade 3 N/A N/A Classification: Medium N/A N/A Exudate A mount: Serosanguineous N/A N/A Exudate Type: red, brown N/A N/A Exudate Color: Distinct, outline attached N/A N/A Wound Margin: Large (67-100%) N/A N/A Granulation A mount: Red N/A N/A Granulation Quality: None Present (0%) N/A N/A Necrotic A mount: Fat Layer (Subcutaneous Tissue): Yes N/A N/A Exposed Structures: Fascia: No Tendon: No Muscle: No Joint: No Bone: No Small (1-33%) N/A N/A Epithelialization: callous periwound. N/A N/A Assessment Notes: Compression Therapy N/A N/A Procedures  Performed: Treatment Notes Wound #3 (Foot) Wound Laterality: Plantar, Left Cleanser Soap and Water Discharge Instruction: May shower and wash wound with dial antibacterial soap and water prior to dressing change. Wound Cleanser Discharge Instruction: Cleanse the wound with wound cleanser prior to applying a clean dressing using gauze sponges, not tissue or cotton balls. Peri-Wound Care Zinc Oxide Ointment 30g tube Discharge Instruction: Apply Zinc Oxide to periwound with each dressing change Topical Primary Dressing Hydrofera Blue Ready Foam, 2.5 x2.5 in Discharge Instruction: Apply to wound bed as instructed Secondary Dressing ABD Pad, 5x9 Discharge Instruction: Apply over primary dressing as directed. Zetuvit Plus 4x8 in Discharge Instruction: Apply over primary dressing as directed. Optifoam Non-Adhesive Dressing, 4x4 in Discharge Instruction: ****FOAM DONUT****Apply over primary dressing as directed. Secured With 72M [employer] Surgical T ape, 2x2 (in/yd) Discharge Instruction: Secure dressing with tape as directed. Compression Wrap FourPress (4 layer compression wrap) Discharge Instruction: Apply four layer compression as directed. May also use [place] CoFlex 2 layer compression system as alternative. Compression Stockings Add-Ons Electronic Signature(s) Signed: 07/31/2021 12:39:46 PM By: Linton Ham MD Entered By: Linton Ham on 07/31/2021 09:60:45 -------------------------------------------------------------------------------- Multi-Disciplinary Care Plan Details Patient Name: Date of Service: [name], [name] 07/31/2021 10:45 A M Medical Record Number: [id number] Patient Account Number: [id number] Date of Birth/Sex: Treating RN: 12-25-1960 (60 y.o. [name] Primary Care Daly Whipkey: Billey Gosling Other Clinician: Referring Ladeidra Borys: Treating Tieasha Larsen/Extender: Genella Rife in Treatment: 8 Multidisciplinary Care Plan  reviewed with physician Active Inactive HBO Nursing Diagnoses: Anxiety related to feelings of confinement associated with the hyperbaric oxygen chamber Anxiety related to knowledge deficit of hyperbaric oxygen therapy and treatment procedures Discomfort related to temperature and humidity changes inside hyperbaric chamber Potential for barotraumas to ears, sinuses, teeth, and lungs or cerebral gas embolism related to changes in atmospheric pressure inside hyperbaric oxygen chamber Potential for oxygen toxicity seizures related to delivery of 100% oxygen at an increased atmospheric pressure Potential for pulmonary oxygen toxicity related to delivery of 100% oxygen at an increased atmospheric pressure Goals: Barotrauma will be prevented during HBO2 Date Initiated: 06/04/2021 T arget Resolution Date: 08/14/2021 Goal Status: Active Patient and/or family will be able to state/discuss factors appropriate to the management of their disease process during treatment Date Initiated: 06/04/2021 T arget Resolution Date: 08/14/2021 Goal Status: Active Patient will tolerate the hyperbaric oxygen therapy treatment Date Initiated: 06/04/2021 T arget Resolution Date: 08/14/2021 Goal Status: Active Patient will tolerate the internal climate of the chamber Date Initiated: 06/04/2021 T arget  Resolution Date: 08/14/2021 Goal Status: Active Patient/caregiver will verbalize understanding of HBO goals, rationale, procedures and potential hazards Date Initiated: 06/04/2021 Date Inactivated: 07/28/2021 Target Resolution Date: 08/14/2021 Goal Status: Met Signs and symptoms of pulmonary oxygen toxicity will be recognized and promptly addressed Date Initiated: 06/04/2021 Target Resolution Date: 08/14/2021 Goal Status: Active Signs and symptoms of seizure will be recognized and promptly addressed ; seizing patients will suffer no harm Date Initiated: 06/04/2021 Target Resolution Date: 08/14/2021 Goal Status:  Active Interventions: Administer a five (5) minute air break for patient if signs and symptoms of seizure appear and notify the hyperbaric physician Administer decongestants, per physician orders, prior to HBO2 Administer the correct therapeutic gas delivery based on the patients needs and limitations, per physician order Assess and provide for patients comfort related to the hyperbaric environment and equalization of middle ear Assess for signs and symptoms related to adverse events, including but not limited to confinement anxiety, pneumothorax, oxygen toxicity and baurotrauma Assess patient for any history of confinement anxiety Assess patient's knowledge and expectations regarding hyperbaric medicine and provide education related to the hyperbaric environment, goals of treatment and prevention of adverse events Implement protocols to decrease risk of pneumothorax in high risk patients Notes: Nutrition Nursing Diagnoses: Impaired glucose control: actual or potential Potential for alteratiion in Nutrition/Potential for imbalanced nutrition Goals: Patient/caregiver agrees to and verbalizes understanding of need to use nutritional supplements and/or vitamins as prescribed Date Initiated: 06/04/2021 Date Inactivated: 07/07/2021 Target Resolution Date: 07/04/2021 Goal Status: Met Patient/caregiver will maintain therapeutic glucose control Date Initiated: 06/04/2021 Target Resolution Date: 08/14/2021 Goal Status: Active Interventions: Assess HgA1c results as ordered upon admission and as needed Assess patient nutrition upon admission and as needed per policy Provide education on elevated blood sugars and impact on wound healing Notes: 07/07/21: Glucose control ongoing Electronic Signature(s) Signed: 07/31/2021 1:42:09 PM By: Deon Pilling RN, BSN Entered By: Deon Pilling on 07/31/2021 12:19:28 -------------------------------------------------------------------------------- Pain  Assessment Details Patient Name: Date of Service: [name] 07/31/2021 10:45 A M Medical Record Number: [id number] Patient Account Number: [id number] Date of Birth/Sex: Treating RN: Feb 19, 1961 (60 y.o. [name] Primary Care Jacklynn Dehaas: Billey Gosling Other Clinician: Referring Bilan Tedesco: Treating Jerad Dunlap/Extender: Genella Rife in Treatment: 8 Active Problems Location of Pain Severity and Description of Pain Patient Has Paino No Site Locations Rate the pain. Rate the pain. Current Pain Level: 0 Pain Management and Medication Current Pain Management: Medication: No Cold Application: No Rest: No Massage: No Activity: No T.E.N.S.: No Heat Application: No Leg drop or elevation: No Is the Current Pain Management Adequate: Adequate How does your wound impact your activities of daily livingo Sleep: No Bathing: No Appetite: No Relationship With Others: No Bladder Continence: No Emotions: No Bowel Continence: No Work: No Toileting: No Drive: No Dressing: No Hobbies: No Engineer, maintenance) Signed: 07/31/2021 1:42:09 PM By: Deon Pilling RN, BSN Entered By: Deon Pilling on 07/31/2021 12:07:34 -------------------------------------------------------------------------------- Patient/Caregiver Education Details Patient Name: Date of Service: [name] 10/14/2022andnbsp10:45 A M Medical Record Number: [id number] Patient Account Number: [id number] Date of Birth/Gender: Treating RN: [birth year] (60 y.o. [name] Primary Care Physician: Billey Gosling Other Clinician: Referring Physician: Treating Physician/Extender: Genella Rife in Treatment: 8 Education Assessment Education Provided To: Patient Education Topics Provided Elevated Blood Sugar/ Impact on Healing: Handouts: Elevated Blood Sugars: How Do They Affect Wound Healing Methods: Explain/Verbal Responses: Reinforcements needed Electronic  Signature(s) Signed: 07/31/2021 1:42:09 PM By: Deon Pilling RN, BSN Entered By: Deon Pilling on  07/31/2021 12:20:03 -------------------------------------------------------------------------------- Wound Assessment Details Patient Name: Date of Service: [name], [name] 07/31/2021 10:45 A M Medical Record Number: [id number] Patient Account Number: [id number] Date of Birth/Sex: Treating RN: 09/16/1961 (60 y.o. M) Primary Care Dolores Mcgovern: Billey Gosling Other Clinician: Referring Taneal Sonntag: Treating Demi Trieu/Extender: Genella Rife in Treatment: 8 Wound Status Wound Number: 3 Primary Diabetic Wound/Ulcer of the Lower Extremity Etiology: Wound Location: Left, Plantar Foot Wound Open Wounding Event: Gradually Appeared Status: Date Acquired: 03/18/2021 Comorbid Anemia, Sleep Apnea, Congestive Heart Failure, Hypertension, Weeks Of Treatment: 8 History: Peripheral Venous Disease, Cirrhosis , Type II Diabetes, Clustered Wound: No Osteoarthritis, Neuropathy Photos Wound Measurements Length: (cm) 1.6 Width: (cm) 2.9 Depth: (cm) 0.2 Area: (cm) 3.644 Volume: (cm) 0.729 % Reduction in Area: 48.5% % Reduction in Volume: -3.1% Epithelialization: Small (1-33%) Tunneling: No Undermining: No Wound Description Classification: Grade 3 Wound Margin: Distinct, outline attached Exudate Amount: Medium Exudate Type: Serosanguineous Exudate Color: red, brown Foul Odor After Cleansing: No Slough/Fibrino No Wound Bed Granulation Amount: Large (67-100%) Exposed Structure Granulation Quality: Red Fascia Exposed: No Necrotic Amount: None Present (0%) Fat Layer (Subcutaneous Tissue) Exposed: Yes Tendon Exposed: No Muscle Exposed: No Joint Exposed: No Bone Exposed: No Assessment Notes callous periwound. Treatment Notes Wound #3 (Foot) Wound Laterality: Plantar, Left Cleanser Soap and Water Discharge Instruction: May shower and wash wound with dial antibacterial soap and  water prior to dressing change. Wound Cleanser Discharge Instruction: Cleanse the wound with wound cleanser prior to applying a clean dressing using gauze sponges, not tissue or cotton balls. Peri-Wound Care Zinc Oxide Ointment 30g tube Discharge Instruction: Apply Zinc Oxide to periwound with each dressing change Topical Primary Dressing Hydrofera Blue Ready Foam, 2.5 x2.5 in Discharge Instruction: Apply to wound bed as instructed Secondary Dressing ABD Pad, 5x9 Discharge Instruction: Apply over primary dressing as directed. Zetuvit Plus 4x8 in Discharge Instruction: Apply over primary dressing as directed. Optifoam Non-Adhesive Dressing, 4x4 in Discharge Instruction: ****FOAM DONUT****Apply over primary dressing as directed. Secured With 58M [place] Surgical T ape, 2x2 (in/yd) Discharge Instruction: Secure dressing with tape as directed. Compression Wrap FourPress (4 layer compression wrap) Discharge Instruction: Apply four layer compression as directed. May also use [employer] CoFlex 2 layer compression system as alternative. Compression Stockings Add-Ons Electronic Signature(s) Signed: 07/31/2021 1:42:09 PM By: Deon Pilling RN, BSN Entered By: Deon Pilling on 07/31/2021 12:08:16 -------------------------------------------------------------------------------- Vitals Details Patient Name: Date of Service: [name] 07/31/2021 10:45 A M Medical Record Number: [id number] Patient Account Number: [id number] Date of Birth/Sex: Treating RN: 1961/07/04 (60 y.o. [name] Primary Care Coner Gibbard: Billey Gosling Other Clinician: Donavan Burnet Referring Kadarious Dikes: Treating Dhani Dannemiller/Extender: Genella Rife in Treatment: 8 Vital Signs Time Taken: 12:05 Temperature (F): 98.6 Weight (lbs): 270 Pulse (bpm): 63 Respiratory Rate (breaths/min): 18 Blood Pressure (mmHg): 111/70 Capillary Blood Glucose (mg/dl): 163 Reference Range: 80 - 120  mg / dl Notes see HBO vital signs. Electronic Signature(s) Signed: 07/31/2021 12:25:44 PM By: Donavan Burnet EMT Previous Signature: 07/31/2021 12:07:23 PM Version By: Donavan Burnet EMT Entered By: Donavan Burnet on 07/31/2021 12:08:08

## 2021-07-31 NOTE — Progress Notes (Signed)
CORY, KITT ([id number]) Visit Report for 07/31/2021 SuperBill Details Patient Name: Date of Service: [name], [name] 07/31/2021 Medical Record Number: [id number] Patient Account Number: [id number] Date of Birth/Sex: Treating RN: 01-04-1961 (60 y.o. [name] Primary Care Provider: [name] Other Clinician: Haywood Pao Referring Provider: Treating Provider/Extender: Avie Echevaria in Treatment: 8 Diagnosis Coding ICD-10 Codes Code Description [phone] Chronic multifocal osteomyelitis, left ankle and foot E11.621 Type 2 diabetes mellitus with foot ulcer M14.672 Charcot's joint, left ankle and foot L97.528 Non-pressure chronic ulcer of other part of left foot with other specified severity Facility Procedures CPT4 Code Description Modifier Quantity [id number]-(Facility Use Only) HBOT full body chamber, , 4 ICD-10 Diagnosis Description M86.372 Chronic multifocal osteomyelitis, left ankle and foot E11.621 Type 2 diabetes mellitus with foot ulcer Physician Procedures Quantity CPT4 Code Description Modifier [id number] - WC PHYS HYPERBARIC OXYGEN THERAPY 1 ICD-10 Diagnosis Description M86.372 Chronic multifocal osteomyelitis, left ankle and foot E11.621 Type 2 diabetes mellitus with foot ulcer Electronic Signature(s) Signed: 07/31/2021 12:14:21 PM By: Haywood Pao EMT Signed: 07/31/2021 12:39:46 PM By: Baltazar Najjar MD Entered By: Haywood Pao on 07/31/2021 12:14:20

## 2021-07-31 NOTE — Progress Notes (Signed)
[name], KEEP ([id number]) Visit Report for 07/31/2021 HPI Details Patient Name: Date of Service: [name], NOLDEN 07/31/2021 10:45 A M Medical Record Number: [id number] Patient Account Number: [id number] Date of Birth/Sex: Treating RN: August 10, 1961 (60 y.o. M) Primary Care Provider: [name] Other Clinician: Referring Provider: Treating Provider/Extender: Avie Echevaria in Treatment: 8 History of Present Illness HPI Description: 10/31/2019 upon evaluation today patient appears to be doing somewhat poorly in regard to his bilateral plantar feet. He has wounds that he tells me have been present since 2012 intermittently off and on. Most recently this has been open for at least the past 6 months to a year. He has been trying to treat this in different ways using Santyl along with various other dressings including Medihoney and even at one point Xeroform. Nothing really has seem to get this completely closed. He was recently in the hospital for cellulitis of his leg subsequently he did have x-rays as well as MRIs that showed negative for any signs of osteomyelitis in regard to the wounds on his feet. Fortunately there is no signs of systemic infection at this time. No fevers, chills, nausea, vomiting, or diarrhea. Patient has previously used Darco offloading shoes as far as frontal floaters as well as postop surgical shoes. He has never been in a total contact cast that may be something we need to strongly consider here. Patient's most recent hemoglobin A1c 1 month ago was 5.3 seems to be very well controlled which is great. Subsequently he has seen vascular as well as podiatry. His ABIs are 1.07 on the left and 1.14 on the right he seems to be doing well he does have chronic venous stasis. 11/07/2019 upon evaluation today patient appears to be doing well with regard to his wounds all things considered. I do not see any severe worsening he still has some callus buildup on the  right more than the left he notes that he has been probably more active than he should as far as walking is concerned is just very hard to not be active. He knows he needs to be more careful in this regard however. He is willing to give the cast a try at this point although he notes that he is a little nervous about this just with regard to balance although he will be very careful and obviously if he has any trouble he knows to contact the office and let me know. 1/22; patient is in for his obligatory first total contact cast change. Our intake nurse reported a very large amount of drainage which is spelled out over to the surrounding skin. Has bilateral diabetic foot wounds. He has Charcot feet. We have been using silver alginate on his wounds. 11/14/2019 on evaluation today patient is actually seeming to make good improvement in regard to his bilateral plantar foot wounds. We have been using a cast on the left side and on the right side he has been using dressings he is changing up his own accord. With that being said he tells me that he is also not walking as much just due to how unsteady he feels. He takes it easy when he does have to walk and when he does not have to walk he is resting. This is probably help in his right foot as well has the left foot which is actually measuring better. In fact both are measuring better. Overall I am very pleased with how things seem to be progressing. The patient does have some odor  on the left foot this does have me concerned about the possibility of infection, and actually probably go ahead and put him on antibiotics today as well as utilizing a continuation of the cast on the left foot I think that will be fine we probably just need to bring him in sooner to change this not last a whole week. 1/29; we brought the patient back today for a total contact cast change on the left out of concern for excessive drainage. We are using drawtex over the wound as the  primary dressing 11/21/2019 on evaluation today patient appears to be doing well with regard to his left plantar foot. In fact both foot ulcers actually seem to be doing pretty well. Nonetheless he is having a lot of drainage on the left at this time and again we did obtain a wound culture did show positive for Staph aureus that was reviewed by myself today as well. Nonetheless he is on Bactrim which was shown to be sensitive that should be helping in this regard. Fortunately there is no signs of infection systemically at this point. 2/5; back in clinic today for a total contact cast change apparently secondary to very significant drainage. Still using drawtex 11/28/2019 upon evaluation today patient appears to be doing well with regard to his wounds. The right foot is doing okay as measured about the same in my opinion. The left foot is actually showing signs of significant improvement is measuring smaller there is a lot of hyper granulation likely due to the continued drainage at this point. We did obtain approval for a snap VAC I think that is good to be appropriate for him and will likely help this tremendously underneath the cast. He is definitely in agreement with proceeding with such. 2/12; patient came in today after his snap VAC lost suction. Brought in to see one of our nurses. The dressing was replaced and then we put the cast back on and rehooked up the snap VAC. Apparently his wound looked very good per our intake nurse. 2/15; again we replaced the cast on Friday. By Saturday the snap VAC and light suction. He called this morning he comes in acutely. The wounds look fine however the VAC is not functioning. We replaced the cast using silver alginate as the primary dressing backed with Kerramax. The snap VAC was not replaced 12/05/2019 upon evaluation today patient appears to be doing better in regard to his left plantar foot ulcer. Fortunately there is no signs of active infection at this  time. Unfortunately he is continuing to have issues with the right foot he is really not making any progress here things seem to be somewhat stagnant to be honest. The depth has increased but that is due to me having debrided the wound in the past based on what I am seeing. 12/12/2019 on evaluation today patient appears to be doing more poorly in regard to the left lower extremity. He has some erythema spreading up the side of his foot I am concerned about infection again at this point. Unfortunately he has been seeing improvement with a total contact cast but I do not think we should put that on today. On his right plantar foot he continues to have significant drainage this is actually measuring deeper I really do not feel like you are making any progress whatsoever. I have prescribed Granix for him unfortunately his insurance apparently was going to cost him a $500 co-pay. 12/19/2019 upon evaluation today patient actually appears to be  doing better in regard to both wounds. With that being said he actually did get the reGranix which he had to pay $500 for. With that being said it does look like that he is actually made some improvement based on what I am seeing at this point with the reGranix. Obviously if he is going to continue this we are going to do something about trying to get him some help in covering the cost. 12/26/2019 on evaluation today patient appears to be doing really much better even compared to last week. Overall the wound seems to be much better even compared to last week and last week was better than the week before. Since has been using the reGranix his symptoms have improved significantly. With that being said the issue right now is simply that this is a very expensive medication for him the first dose cost him $500. Upon inspection patient's wound bed actually is however dramatically improved compared to before he started this 2 weeks ago. 01/02/2020 upon evaluation today patient  appears to actually be doing well. He still had a little bit of reGranix left that has been using in small amounts he just been applying it every other day instead of every day in order to make it go longer. Overall we are still seeing excellent improvement he is measuring smaller looking better healthier tissue and everything seems to be pointing to this headed in the right direction. Fortunately there is no evidence of infection either which is also excellent news. He does have his MRI coming up within the next week. 01/09/2020 upon evaluation today patient appears to be doing a little worse today compared to previous week's evaluation. He is actually been out of the reGranix at this point. He has been trying to make the stretch out so he has been changing the dressings on a regular set schedule like he was previous. I think this has made a difference. Fortunately there is no signs of active infection at this time. No fevers, chills, nausea, vomiting, or diarrhea. 01/16/2020 upon evaluation today patient appears to be doing well with regard to his left plantar foot ulcer. The right plantar foot still shows some significant depth at this point. Fortunately there is no signs of active infection at this time. 01/30/2020 upon evaluation today patient appears to be doing about the same in regard to his right plantar foot ulcer there is still some depth here and we had to wait till he actually switched over to his new insurance to get approval for the MRI under his new insurance plan. With that being said he now has switched as of April 1. Fortunately there is no signs of active infection at this time. Overall in regard to his left foot ulcer this seems to be doing much better and I am actually very pleased with how things are going. With that being said it is not quite as much progress as we were seeing with the reGranix but at the same time he has had trouble getting this apparently there is been some  hindrance here. I Ernie Hew try to actually send this to [facility] that was recommended by the drug rep to me. 02/13/20 upon evaluation today patient appears to be doing better in regard to his left foot ulcer this is great news. Unfortunately the right foot ulcer is not really significantly better at this time. There is no signs of active infection systemically though he did have his MRI which showed unfortunately he does have infection  noted including an abscess in the foot. There is also marrow changes noted which are consistent with osteomyelitis based on the radiology review and interpretation. Unfortunately considering that the wound is not really making the progress that we will he would like to be seen I think that this is an indication that he may need some further referral both infectious disease as well as potentially to podiatrist to see if there is anything that can be done to help with the situation that were dealing with here. The left foot again is doing great. READMISSION 06/04/2021 This is a 60 year old man who was in the clinic in 2021 followed by Allen Derry for areas on the right and left foot. He developed a left foot infection and was referred to ID. He left the clinic in a nonhealed state and was followed for a period of [facility] Marylene Land. Apparently things really deteriorated in early July when he was admitted to hospital from 04/27/2021 through 05/06/2021 with sepsis secondary to a left foot infection. His blood cultures were negative. An MRI suggested fifth metatarsal osteomyelitis a left ankle septic joint. He was treated with vancomycin and ertapenem which he is still taking and may just about be finishing. He was seen by orthopedics and the patient adamantly refused to BKA. As far as I can tell he did not have the ankle aspirated I am not exactly sure what the issue was here. Since he has been discharged she is at [name] health care for  rehabilitation. He was last seen by Dr. Algis Liming on 05/20/2021 he noted osteo of the tibial talar bone cuboid and fifth metatarsal which is even more extensive than what was suggested by the MRI. He is apparently going for a consultation with orthopedic surgery in [place] sometime next week. I received a call about this man 2 weeks ago from Dr. Allyson Sabal who follows him for the possibility of PAD. ABIs I think done in the office showed a ABI on the right of 1.05 at the PTA and 0.99 at the PTA on the left. He had a DVT rule out in the left leg that was negative for the DVT. Past medical history is extensive and includes diastolic heart failure, right first metatarsal head ulcer in 2021, excision of the right second ray by Dr. Marylene Land on 03/13/2020, hypertension, hypothyroidism. Left total hip replacement, right total knee replacement, carpal tunnel syndrome, obstructive sleep apnea alcohol abuse with cirrhosis although the patient denies current alcohol intake. The patient does not think he is a diabetic however looking through [place] link I see 2 HgbAic's of this year that were greater than equal to 6.5 which by definition makes him diabetic. Nevertheless he is not on any treatment and does not check his blood sugars. The patient is now back home out of the nursing home. Saw Dr. Algis Liming last week he was taken off vancomycin and ertapenem on August 22 and now is on doxycycline on Augmentin. He also saw Dr. Weston Anna who is his orthopedic surgeon in [place] he recommended a [employer]	[place]. He has been using Medihoney. 9/6I have been having trouble getting hyperbarics approved through our prior authorization process. Even though he had a limb threatening infection in the left foot and probably the left ankle there glitches in how some of the reports are worded also some of the consultants. In any case I am going to repeat his sedimentation rate and C-reactive protein. I am generally not in favor of doing things  like this as they really do not alter the plan of care from my point of view however I am going to need to demonstrate that these remain high in order to get this through forhyperbaric treatment for chronic refractory osteomyelitis 9/13; following this man for a wound on his left plantar foot in the setting of type 2 diabetes and Charcot deformity. He has underlying chronic refractory osteomyelitis. Follow-up sedimentation rate and C-reactive protein were both elevated but the C-reactive protein was down to 1.4, sedimentation rate at 70. Sedimentation rate was only slightly down from previous at 85. His wound is measuring slightly smaller. 9/20; patient started hyperbaric oxygen therapy today and tolerated treatment well. This is for the underlying osteomyelitis. He remains on antibiotics but thinks he is getting close to finishing. The wound on the plantar aspect of his foot is the other issue we are following here. He is using Medihoney The patient has a Charcot foot in the setting of type 2 diabetes. He is going to need a total contact cast although his partner was away this week and we elected to delay this till next week 9/27; patient still tolerating hyperbaric oxygen well. Wound looked generally healthy not much depth under illumination still 100% covered in fibrinous debris. Raised callused edges around the wound he was prepared for a total contact cast. We have been using Medihoney 9/30; patient is back for his first obligatory total contact cast change. We are using Hydrofera Blue. Noted by our intake nurse to have a lot of drainage or at least a moderate amount of drainage. I am not sure I was previously aware of this 10/4; patient arrives today with a lot of drainage under the cast. When he had it changed last Friday there was also a similar amount of drainage. Her intake nurse says that they tried a wound VAC on him perhaps while I was on vacation in August under the cast but that did not  work. In my experience that has not been unusual. We have been using Hydrofera Blue with all the secondary absorbers. The drainage today went right through all of our dressings. The patient is concerned about his foot being in a cast without much drainage. He is tolerating HBO well. There has been improvements in the wound in the mid part of his foot in the setting of a Charcot deformity 10/7; patient presents for cast change. He has no issues or complaints today. He denies signs of infection. 10/11; patient presents for cast change. At this time he would like to take a break from the cast. He would like to do daily dressing changes with Hydrofera Blue. He currently denies signs of infection. 10/14; his cast was taken off last week at his request. He arrives in the clinic with [facility]. He has been changing the dressing himself. He has way too much edema in the left foot and leg to consider a total contact cast. I do not really know the issue here. He does have chronic venous insufficiency His wife stopped me in the clinic earlier in the week to report he is drinking again and she is concerned. I am uncertain whether there are other issues Electronic Signature(s) Signed: 07/31/2021 12:39:46 PM By: Baltazar Najjar MD Entered By: Baltazar Najjar on 07/31/2021 12:30:20 -------------------------------------------------------------------------------- Physical Exam Details Patient Name: Date of Service: [name], PERLMUTTER IG 07/31/2021 10:45 A M Medical Record Number: [id number] Patient Account Number: [id number] Date of Birth/Sex: Treating RN: [birth year] (60 y.o. M) Primary Care Provider:  [name] Other Clinician: Referring Provider: Treating Provider/Extender: Avie Echevaria in Treatment: 8 Constitutional Sitting or standing Blood Pressure is within target range for patient.. Pulse regular and within target range for patient.Marland Kitchen Respirations regular, non-labored and within  target range.. Temperature is normal and within the target range for the patient.Marland Kitchen Appears in no distress. Notes Wound exam; the plantar wound on his left foot actually does not look too bad and may be even slightly smaller. However he has considerable edema in both feet and legs. He has chronic venous insufficiency but I am uncertain whether this could have another component factor to it or not. I will need to check him when I see him early next week. There is no evidence of infection in the left foot or leg and no evidence of a DVT Electronic Signature(s) Signed: 07/31/2021 12:39:46 PM By: Baltazar Najjar MD Entered By: Baltazar Najjar on 07/31/2021 12:31:44 -------------------------------------------------------------------------------- Physician Orders Details Patient Name: Date of Service: [name] 07/31/2021 10:45 A M Medical Record Number: [id number] Patient Account Number: [id number] Date of Birth/Sex: Treating RN: 1961/06/25 (60 y.o. [name] Primary Care Provider: [name] Other Clinician: Referring Provider: Treating Provider/Extender: Avie Echevaria in Treatment: 8 Verbal / Phone Orders: No Diagnosis Coding ICD-10 Coding Code Description E11.621 Type 2 diabetes mellitus with foot ulcer M14.672 Charcot's joint, left ankle and foot L97.528 Non-pressure chronic ulcer of other part of left foot with other specified severity M86.372 Chronic multifocal osteomyelitis, left ankle and foot Follow-up Appointments ppointment in 1 week. - Tuesday Dr. Leanord Hawking after Hyberbarics. Return A Bathing/ Shower/ Hygiene May shower and wash wound with soap and water. - prior to dressing change Edema Control - Lymphedema / SCD / Other Elevate legs to the level of the heart or above for 30 minutes daily and/or when sitting, a frequency of: - throughout the day Avoid standing for long periods of time. Exercise regularly Moisturize legs daily. Compression  stocking or Garment 30-40 mm/Hg pressure to: - wear the juxtalite HD to right leg. apply in the morning and remove at night. Off-Loading Total Contact Cast to Left Lower Extremity - Size 4 Hold due to swelling. Additional Orders / Instructions Follow Nutritious Diet Hyperbaric Oxygen Therapy Wound #3 Left,Plantar Foot Evaluate for HBO Therapy - Starts 07/07/21 Indication: - Chronic Refractory Osteomyelitis 2.5 ATA for 90 Minutes with 2 Five (5) Minute A Breaks ir Total Number of Treatments: - 40 One treatments per day (delivered Monday through Friday unless otherwise specified in Special Instructions below): Finger stick Blood Glucose Pre- and Post- HBOT Treatment. Follow Hyperbaric Oxygen Glycemia Protocol Afrin (Oxymetazoline HCL) 0.05% nasal spray - 1 spray in both nostrils daily as needed prior to HBO treatment for difficulty clearing ears Wound Treatment Wound #3 - Foot Wound Laterality: Plantar, Left Cleanser: Soap and Water 1 x Per Week Discharge Instructions: May shower and wash wound with dial antibacterial soap and water prior to dressing change. Cleanser: Wound Cleanser 1 x Per Week Discharge Instructions: Cleanse the wound with wound cleanser prior to applying a clean dressing using gauze sponges, not tissue or cotton balls. Peri-Wound Care: Zinc Oxide Ointment 30g tube 1 x Per Week Discharge Instructions: Apply Zinc Oxide to periwound with each dressing change Prim Dressing: Hydrofera Blue Ready Foam, 2.5 x2.5 in 1 x Per Week ary Discharge Instructions: Apply to wound bed as instructed Secondary Dressing: ABD Pad, 5x9 1 x Per Week Discharge Instructions: Apply over primary dressing as directed. Secondary Dressing:  Zetuvit Plus 4x8 in 1 x Per Week Discharge Instructions: Apply over primary dressing as directed. Secondary Dressing: Optifoam Non-Adhesive Dressing, 4x4 in 1 x Per Week Discharge Instructions: ****FOAM DONUT****Apply over primary dressing as directed. Secured  With: 39M Medipore H Soft Cloth Surgical Tape, 2x2 (in/yd) 1 x Per Week Discharge Instructions: Secure dressing with tape as directed. Compression Wrap: FourPress (4 layer compression wrap) 1 x Per Week Discharge Instructions: Apply four layer compression as directed. May also use Miliken CoFlex 2 layer compression system as alternative. GLYCEMIA INTERVENTIONS PROTOCOL PRE-HBO GLYCEMIA INTERVENTIONS ACTION INTERVENTION Obtain pre-HBO capillary blood glucose (ensure 1 physician order is in chart). A. Notify HBO physician and await physician orders. 2 If result is 70 mg/dl or below: B. If the result meets the hospital definition of a critical result, follow hospital policy. A. Give patient an 8 ounce Glucerna Shake, an 8 ounce Ensure, or 8 ounces of a Glucerna/Ensure equivalent dietary supplement*. B. Wait 30 minutes. If result is 71 mg/dl to 409 mg/dl: C. Retest patients capillary blood glucose (CBG). D. If result greater than or equal to 110 mg/dl, proceed with HBO. If result less than 110 mg/dl, notify HBO physician and consider holding HBO. If result is 131 mg/dl to 811 mg/dl: A. Proceed with HBO. A. Notify HBO physician and await physician orders. B. It is recommended to hold HBO and do If result is 250 mg/dl or greater: blood/urine ketone testing. C. If the result meets the hospital definition of a critical result, follow hospital policy. POST-HBO GLYCEMIA INTERVENTIONS ACTION INTERVENTION Obtain post HBO capillary blood glucose (ensure 1 physician order is in chart). A. Notify HBO physician and await physician orders. 2 If result is 70 mg/dl or below: B. If the result meets the hospital definition of a critical result, follow hospital policy. A. Give patient an 8 ounce Glucerna Shake, an 8 ounce Ensure, or 8 ounces of a Glucerna/Ensure equivalent dietary supplement*. B. Wait 15 minutes for symptoms of If result is 71 mg/dl to 914 mg/dl: hypoglycemia (i.e.  nervousness, anxiety, sweating, chills, clamminess, irritability, confusion, tachycardia or dizziness). C. If patient asymptomatic, discharge patient. If patient symptomatic, repeat capillary blood glucose (CBG) and notify HBO physician. If result is 101 mg/dl to 782 mg/dl: A. Discharge patient. A. Notify HBO physician and await physician orders. B. It is recommended to do blood/urine ketone If result is 250 mg/dl or greater: testing. C. If the result meets the hospital definition of a critical result, follow hospital policy. *Juice or candies are NOT equivalent products. If patient refuses the Glucerna or Ensure, please consult the hospital dietitian for an appropriate substitute. Electronic Signature(s) Signed: 07/31/2021 12:39:46 PM By: Baltazar Najjar MD Signed: 07/31/2021 1:42:09 PM By: Shawn Stall RN, BSN Entered By: Shawn Stall on 07/31/2021 12:18:49 -------------------------------------------------------------------------------- Problem List Details Patient Name: Date of Service: WASSIM, KIRKSEY IG 07/31/2021 10:45 A M Medical Record Number: [id number] Patient Account Number: [id number] Date of Birth/Sex: Treating RN: 05/20/1961 (60 y.o. [name] Primary Care Provider: [name] Other Clinician: Referring Provider: Treating Provider/Extender: Avie Echevaria in Treatment: 8 Active Problems ICD-10 Encounter Code Description Active Date MDM Diagnosis E11.621 Type 2 diabetes mellitus with foot ulcer 06/04/2021 No Yes M14.672 Charcot's joint, left ankle and foot 06/04/2021 No Yes L97.528 Non-pressure chronic ulcer of other part of left foot with other specified 06/04/2021 No Yes severity M86.372 Chronic multifocal osteomyelitis, left ankle and foot 06/04/2021 No Yes Inactive Problems Resolved Problems Electronic Signature(s) Signed: 07/31/2021 12:39:46 PM By:  Baltazar Najjar MD Entered By: Baltazar Najjar on 07/31/2021  12:28:45 -------------------------------------------------------------------------------- Progress Note Details Patient Name: Date of Service: [name], BOQUET IG 07/31/2021 10:45 A M Medical Record Number: [id number] Patient Account Number: [id number] Date of Birth/Sex: Treating RN: June 12, 1961 (60 y.o. M) Primary Care Provider: [name] Other Clinician: Referring Provider: Treating Provider/Extender: Avie Echevaria in Treatment: 8 Subjective History of Present Illness (HPI) 10/31/2019 upon evaluation today patient appears to be doing somewhat poorly in regard to his bilateral plantar feet. He has wounds that he tells me have been present since 2012 intermittently off and on. Most recently this has been open for at least the past 6 months to a year. He has been trying to treat this in different ways using Santyl along with various other dressings including Medihoney and even at one point Xeroform. Nothing really has seem to get this completely closed. He was recently in the hospital for cellulitis of his leg subsequently he did have x-rays as well as MRIs that showed negative for any signs of osteomyelitis in regard to the wounds on his feet. Fortunately there is no signs of systemic infection at this time. No fevers, chills, nausea, vomiting, or diarrhea. Patient has previously used Darco offloading shoes as far as frontal floaters as well as postop surgical shoes. He has never been in a total contact cast that may be something we need to strongly consider here. Patient's most recent hemoglobin A1c 1 month ago was 5.3 seems to be very well controlled which is great. Subsequently he has seen vascular as well as podiatry. His ABIs are 1.07 on the left and 1.14 on the right he seems to be doing well he does have chronic venous stasis. 11/07/2019 upon evaluation today patient appears to be doing well with regard to his wounds all things considered. I do not see any severe  worsening he still has some callus buildup on the right more than the left he notes that he has been probably more active than he should as far as walking is concerned is just very hard to not be active. He knows he needs to be more careful in this regard however. He is willing to give the cast a try at this point although he notes that he is a little nervous about this just with regard to balance although he will be very careful and obviously if he has any trouble he knows to contact the office and let me know. 1/22; patient is in for his obligatory first total contact cast change. Our intake nurse reported a very large amount of drainage which is spelled out over to the surrounding skin. Has bilateral diabetic foot wounds. He has Charcot feet. We have been using silver alginate on his wounds. 11/14/2019 on evaluation today patient is actually seeming to make good improvement in regard to his bilateral plantar foot wounds. We have been using a cast on the left side and on the right side he has been using dressings he is changing up his own accord. With that being said he tells me that he is also not walking as much just due to how unsteady he feels. He takes it easy when he does have to walk and when he does not have to walk he is resting. This is probably help in his right foot as well has the left foot which is actually measuring better. In fact both are measuring better. Overall I am very pleased with how things seem to be progressing.  The patient does have some odor on the left foot this does have me concerned about the possibility of infection, and actually probably go ahead and put him on antibiotics today as well as utilizing a continuation of the cast on the left foot I think that will be fine we probably just need to bring him in sooner to change this not last a whole week. 1/29; we brought the patient back today for a total contact cast change on the left out of concern for excessive  drainage. We are using drawtex over the wound as the primary dressing 11/21/2019 on evaluation today patient appears to be doing well with regard to his left plantar foot. In fact both foot ulcers actually seem to be doing pretty well. Nonetheless he is having a lot of drainage on the left at this time and again we did obtain a wound culture did show positive for Staph aureus that was reviewed by myself today as well. Nonetheless he is on Bactrim which was shown to be sensitive that should be helping in this regard. Fortunately there is no signs of infection systemically at this point. 2/5; back in clinic today for a total contact cast change apparently secondary to very significant drainage. Still using drawtex 11/28/2019 upon evaluation today patient appears to be doing well with regard to his wounds. The right foot is doing okay as measured about the same in my opinion. The left foot is actually showing signs of significant improvement is measuring smaller there is a lot of hyper granulation likely due to the continued drainage at this point. We did obtain approval for a snap VAC I think that is good to be appropriate for him and will likely help this tremendously underneath the cast. He is definitely in agreement with proceeding with such. 2/12; patient came in today after his snap VAC lost suction. Brought in to see one of our nurses. The dressing was replaced and then we put the cast back on and rehooked up the snap VAC. Apparently his wound looked very good per our intake nurse. 2/15; again we replaced the cast on Friday. By Saturday the snap VAC and light suction. He called this morning he comes in acutely. The wounds look fine however the VAC is not functioning. We replaced the cast using silver alginate as the primary dressing backed with Kerramax. The snap VAC was not replaced 12/05/2019 upon evaluation today patient appears to be doing better in regard to his left plantar foot ulcer.  Fortunately there is no signs of active infection at this time. Unfortunately he is continuing to have issues with the right foot he is really not making any progress here things seem to be somewhat stagnant to be honest. The depth has increased but that is due to me having debrided the wound in the past based on what I am seeing. 12/12/2019 on evaluation today patient appears to be doing more poorly in regard to the left lower extremity. He has some erythema spreading up the side of his foot I am concerned about infection again at this point. Unfortunately he has been seeing improvement with a total contact cast but I do not think we should put that on today. On his right plantar foot he continues to have significant drainage this is actually measuring deeper I really do not feel like you are making any progress whatsoever. I have prescribed Granix for him unfortunately his insurance apparently was going to cost him a $500 co-pay. 12/19/2019 upon evaluation  today patient actually appears to be doing better in regard to both wounds. With that being said he actually did get the reGranix which he had to pay $500 for. With that being said it does look like that he is actually made some improvement based on what I am seeing at this point with the reGranix. Obviously if he is going to continue this we are going to do something about trying to get him some help in covering the cost. 12/26/2019 on evaluation today patient appears to be doing really much better even compared to last week. Overall the wound seems to be much better even compared to last week and last week was better than the week before. Since has been using the reGranix his symptoms have improved significantly. With that being said the issue right now is simply that this is a very expensive medication for him the first dose cost him $500. Upon inspection patient's wound bed actually is however dramatically improved compared to before he started this  2 weeks ago. 01/02/2020 upon evaluation today patient appears to actually be doing well. He still had a little bit of reGranix left that has been using in small amounts he just been applying it every other day instead of every day in order to make it go longer. Overall we are still seeing excellent improvement he is measuring smaller looking better healthier tissue and everything seems to be pointing to this headed in the right direction. Fortunately there is no evidence of infection either which is also excellent news. He does have his MRI coming up within the next week. 01/09/2020 upon evaluation today patient appears to be doing a little worse today compared to previous week's evaluation. He is actually been out of the reGranix at this point. He has been trying to make the stretch out so he has been changing the dressings on a regular set schedule like he was previous. I think this has made a difference. Fortunately there is no signs of active infection at this time. No fevers, chills, nausea, vomiting, or diarrhea. 01/16/2020 upon evaluation today patient appears to be doing well with regard to his left plantar foot ulcer. The right plantar foot still shows some significant depth at this point. Fortunately there is no signs of active infection at this time. 01/30/2020 upon evaluation today patient appears to be doing about the same in regard to his right plantar foot ulcer there is still some depth here and we had to wait till he actually switched over to his new insurance to get approval for the MRI under his new insurance plan. With that being said he now has switched as of April 1. Fortunately there is no signs of active infection at this time. Overall in regard to his left foot ulcer this seems to be doing much better and I am actually very pleased with how things are going. With that being said it is not quite as much progress as we were seeing with the reGranix but at the same time he has had  trouble getting this apparently there is been some hindrance here. I Ernie Hew try to actually send this to [facility] that was recommended by the drug rep to me. 02/13/20 upon evaluation today patient appears to be doing better in regard to his left foot ulcer this is great news. Unfortunately the right foot ulcer is not really significantly better at this time. There is no signs of active infection systemically though he did have his MRI which  showed unfortunately he does have infection noted including an abscess in the foot. There is also marrow changes noted which are consistent with osteomyelitis based on the radiology review and interpretation. Unfortunately considering that the wound is not really making the progress that we will he would like to be seen I think that this is an indication that he may need some further referral both infectious disease as well as potentially to podiatrist to see if there is anything that can be done to help with the situation that were dealing with here. The left foot again is doing great. READMISSION 06/04/2021 This is a 60 year old man who was in the clinic in 2021 followed by Allen Derry for areas on the right and left foot. He developed a left foot infection and was referred to ID. He left the clinic in a nonhealed state and was followed for a period of [facility] Marylene Land. Apparently things really deteriorated in early July when he was admitted to hospital from 04/27/2021 through 05/06/2021 with sepsis secondary to a left foot infection. His blood cultures were negative. An MRI suggested fifth metatarsal osteomyelitis a left ankle septic joint. He was treated with vancomycin and ertapenem which he is still taking and may just about be finishing. He was seen by orthopedics and the patient adamantly refused to BKA. As far as I can tell he did not have the ankle aspirated I am not exactly sure what the issue was here. Since he has been  discharged she is at [facility] health care for rehabilitation. He was last seen by Dr. Algis Liming on 05/20/2021 he noted osteo of the tibial talar bone cuboid and fifth metatarsal which is even more extensive than what was suggested by the MRI. He is apparently going for a consultation with orthopedic surgery in [place] sometime next week. I received a call about this man 2 weeks ago from Dr. Allyson Sabal who follows him for the possibility of PAD. ABIs I think done in the office showed a ABI on the right of 1.05 at the PTA and 0.99 at the PTA on the left. He had a DVT rule out in the left leg that was negative for the DVT. Past medical history is extensive and includes diastolic heart failure, right first metatarsal head ulcer in 2021, excision of the right second ray by Dr. Marylene Land on 03/13/2020, hypertension, hypothyroidism. Left total hip replacement, right total knee replacement, carpal tunnel syndrome, obstructive sleep apnea alcohol abuse with cirrhosis although the patient denies current alcohol intake. The patient does not think he is a diabetic however looking through [place] link I see 2 HgbAic's of this year that were greater than equal to 6.5 which by definition makes him diabetic. Nevertheless he is not on any treatment and does not check his blood sugars. The patient is now back home out of the nursing home. Saw Dr. Algis Liming last week he was taken off vancomycin and ertapenem on August 22 and now is on doxycycline on Augmentin. He also saw Dr. Weston Anna who is his orthopedic surgeon in [place] he recommended a [employer]	[place]. He has been using Medihoney. 9/6I have been having trouble getting hyperbarics approved through our prior authorization process. Even though he had a limb threatening infection in the left foot and probably the left ankle there glitches in how some of the reports are worded also some of the consultants. In any case I am going to repeat his sedimentation rate and C-reactive  protein. I am  generally not in favor of doing things like this as they really do not alter the plan of care from my point of view however I am going to need to demonstrate that these remain high in order to get this through forhyperbaric treatment for chronic refractory osteomyelitis 9/13; following this man for a wound on his left plantar foot in the setting of type 2 diabetes and Charcot deformity. He has underlying chronic refractory osteomyelitis. Follow-up sedimentation rate and C-reactive protein were both elevated but the C-reactive protein was down to 1.4, sedimentation rate at 70. Sedimentation rate was only slightly down from previous at 85. His wound is measuring slightly smaller. 9/20; patient started hyperbaric oxygen therapy today and tolerated treatment well. This is for the underlying osteomyelitis. He remains on antibiotics but thinks he is getting close to finishing. The wound on the plantar aspect of his foot is the other issue we are following here. He is using Medihoney The patient has a Charcot foot in the setting of type 2 diabetes. He is going to need a total contact cast although his partner was away this week and we elected to delay this till next week 9/27; patient still tolerating hyperbaric oxygen well. Wound looked generally healthy not much depth under illumination still 100% covered in fibrinous debris. Raised callused edges around the wound he was prepared for a total contact cast. We have been using Medihoney 9/30; patient is back for his first obligatory total contact cast change. We are using Hydrofera Blue. Noted by our intake nurse to have a lot of drainage or at least a moderate amount of drainage. I am not sure I was previously aware of this 10/4; patient arrives today with a lot of drainage under the cast. When he had it changed last Friday there was also a similar amount of drainage. Her intake nurse says that they tried a wound VAC on him perhaps while I was  on vacation in August under the cast but that did not work. In my experience that has not been unusual. We have been using Hydrofera Blue with all the secondary absorbers. The drainage today went right through all of our dressings. The patient is concerned about his foot being in a cast without much drainage. He is tolerating HBO well. There has been improvements in the wound in the mid part of his foot in the setting of a Charcot deformity 10/7; patient presents for cast change. He has no issues or complaints today. He denies signs of infection. 10/11; patient presents for cast change. At this time he would like to take a break from the cast. He would like to do daily dressing changes with Hydrofera Blue. He currently denies signs of infection. 10/14; his cast was taken off last week at his request. He arrives in the clinic with [facility]. He has been changing the dressing himself. He has way too much edema in the left foot and leg to consider a total contact cast. I do not really know the issue here. He does have chronic venous insufficiency His wife stopped me in the clinic earlier in the week to report he is drinking again and she is concerned. I am uncertain whether there are other issues Objective Constitutional Sitting or standing Blood Pressure is within target range for patient.. Pulse regular and within target range for patient.Marland Kitchen Respirations regular, non-labored and within target range.. Temperature is normal and within the target range for the patient.Marland Kitchen Appears in no distress. Vitals Time Taken:  12:05 PM, Weight: 270 lbs, Temperature: 98.6 F, Pulse: 63 bpm, Respiratory Rate: 18 breaths/min, Blood Pressure: 111/70 mmHg, Capillary Blood Glucose: 163 mg/dl. General Notes: see HBO vital signs. General Notes: Wound exam; the plantar wound on his left foot actually does not look too bad and may be even slightly smaller. However he has considerable edema in both feet and legs. He has  chronic venous insufficiency but I am uncertain whether this could have another component factor to it or not. I will need to check him when I see him early next week. There is no evidence of infection in the left foot or leg and no evidence of a DVT Integumentary (Hair, Skin) Wound #3 status is Open. Original cause of wound was Gradually Appeared. The date acquired was: 03/18/2021. The wound has been in treatment 8 weeks. The wound is located on the Left,Plantar Foot. The wound measures 1.6cm length x 2.9cm width x 0.2cm depth; 3.644cm^2 area and 0.729cm^3 volume. There is Fat Layer (Subcutaneous Tissue) exposed. There is no tunneling or undermining noted. There is a medium amount of serosanguineous drainage noted. The wound margin is distinct with the outline attached to the wound base. There is large (67-100%) red granulation within the wound bed. There is no necrotic tissue within the wound bed. General Notes: callous periwound. Assessment Active Problems ICD-10 Type 2 diabetes mellitus with foot ulcer Charcot's joint, left ankle and foot Non-pressure chronic ulcer of other part of left foot with other specified severity Chronic multifocal osteomyelitis, left ankle and foot Procedures Wound #3 Pre-procedure diagnosis of Wound #3 is a Diabetic Wound/Ulcer of the Lower Extremity located on the Left,Plantar Foot . There was a Four Layer Compression Therapy Procedure by Shawn Stall, RN. Post procedure Diagnosis Wound #3: Same as Pre-Procedure Plan Follow-up Appointments: Return Appointment in 1 week. - Tuesday Dr. Leanord Hawking after Hyberbarics. Bathing/ Shower/ Hygiene: May shower and wash wound with soap and water. - prior to dressing change Edema Control - Lymphedema / SCD / Other: Elevate legs to the level of the heart or above for 30 minutes daily and/or when sitting, a frequency of: - throughout the day Avoid standing for long periods of time. Exercise regularly Moisturize legs  daily. Compression stocking or Garment 30-40 mm/Hg pressure to: - wear the juxtalite HD to right leg. apply in the morning and remove at night. Off-Loading: T Contact Cast to Left Lower Extremity - Size 4 Hold due to swelling. otal Additional Orders / Instructions: Follow Nutritious Diet Hyperbaric Oxygen Therapy: Wound #3 Left,Plantar Foot: Evaluate for HBO Therapy - Starts 07/07/21 Indication: - Chronic Refractory Osteomyelitis 2.5 ATA for 90 Minutes with 2 Five (5) Minute Air Breaks T Number of Treatments: - 40 otal One treatments per day (delivered Monday through Friday unless otherwise specified in Special Instructions below): Finger stick Blood Glucose Pre- and Post- HBOT Treatment. Follow Hyperbaric Oxygen Glycemia Protocol Afrin (Oxymetazoline HCL) 0.05% nasal spray - 1 spray in both nostrils daily as needed prior to HBO treatment for difficulty clearing ears WOUND #3: - Foot Wound Laterality: Plantar, Left Cleanser: Soap and Water 1 x Per Week/ Discharge Instructions: May shower and wash wound with dial antibacterial soap and water prior to dressing change. Cleanser: Wound Cleanser 1 x Per Week/ Discharge Instructions: Cleanse the wound with wound cleanser prior to applying a clean dressing using gauze sponges, not tissue or cotton balls. Peri-Wound Care: Zinc Oxide Ointment 30g tube 1 x Per Week/ Discharge Instructions: Apply Zinc Oxide to periwound with each  dressing change Prim Dressing: Hydrofera Blue Ready Foam, 2.5 x2.5 in 1 x Per Week/ ary Discharge Instructions: Apply to wound bed as instructed Secondary Dressing: ABD Pad, 5x9 1 x Per Week/ Discharge Instructions: Apply over primary dressing as directed. Secondary Dressing: Zetuvit Plus 4x8 in 1 x Per Week/ Discharge Instructions: Apply over primary dressing as directed. Secondary Dressing: Optifoam Non-Adhesive Dressing, 4x4 in 1 x Per Week/ Discharge Instructions: ****FOAM DONUT****Apply over primary dressing as  directed. Secured With: 58M Medipore H Soft Cloth Surgical T ape, 2x2 (in/yd) 1 x Per Week/ Discharge Instructions: Secure dressing with tape as directed. Com pression Wrap: FourPress (4 layer compression wrap) 1 x Per Week/ Discharge Instructions: Apply four layer compression as directed. May also use Miliken CoFlex 2 layer compression system as alternative. 1. I am okay with the Hydrofera Blue. 2. I am going to put him in 4-layer compression after reviewing the fact he has had relatively normal ABIs 3. I am not exactly certain why he has so much edema. He does have chronic venous insufficiency but there is no way I could put a total contact cast on him today. previously we never seem to have a problem here. 4. I wonder whether he has an additional contributing medical issue I will try to check him in when I see him on Tuesday and follow-up. Electronic Signature(s) Signed: 07/31/2021 12:39:46 PM By: Baltazar Najjar MD Entered By: Baltazar Najjar on 07/31/2021 12:33:16 -------------------------------------------------------------------------------- SuperBill Details Patient Name: Date of Service: [name] 07/31/2021 Medical Record Number: [id number] Patient Account Number: [id number] Date of Birth/Sex: Treating RN: 01/23/1961 (60 y.o. [name] Primary Care Provider: [name] Other Clinician: Referring Provider: Treating Provider/Extender: Avie Echevaria in Treatment: 8 Diagnosis Coding ICD-10 Codes Code Description E11.621 Type 2 diabetes mellitus with foot ulcer M14.672 Charcot's joint, left ankle and foot L97.528 Non-pressure chronic ulcer of other part of left foot with other specified severity M86.372 Chronic multifocal osteomyelitis, left ankle and foot Facility Procedures CPT4 Code: 65681275 Description: (Facility Use Only) 29581LT - APPLY MULTLAY COMPRS LWR LT LEG Modifier: Quantity: 1 Electronic Signature(s) Signed: 07/31/2021  12:39:46 PM By: Baltazar Najjar MD Entered By: Baltazar Najjar on 07/31/2021 12:33:27

## 2021-08-03 ENCOUNTER — Encounter (HOSPITAL_BASED_OUTPATIENT_CLINIC_OR_DEPARTMENT_OTHER): Payer: Medicare Other | Admitting: Internal Medicine

## 2021-08-04 ENCOUNTER — Other Ambulatory Visit: Payer: Self-pay

## 2021-08-04 ENCOUNTER — Encounter (HOSPITAL_BASED_OUTPATIENT_CLINIC_OR_DEPARTMENT_OTHER): Payer: Medicare Other | Admitting: Internal Medicine

## 2021-08-04 ENCOUNTER — Encounter (HOSPITAL_BASED_OUTPATIENT_CLINIC_OR_DEPARTMENT_OTHER): Payer: Self-pay

## 2021-08-04 DIAGNOSIS — E11621 Type 2 diabetes mellitus with foot ulcer: Secondary | ICD-10-CM | POA: Diagnosis not present

## 2021-08-04 LAB — GLUCOSE, CAPILLARY: Glucose-Capillary: 153 mg/dL — ABNORMAL HIGH (ref 70–99)

## 2021-08-04 NOTE — Progress Notes (Signed)
[name] ([id number]) Visit Report for 08/04/2021 HPI Details Patient Name: Date of Service: [name], [name] 08/04/2021 12:30 PM Medical Record Number: [id number] Patient Account Number: [id number] Date of Birth/Sex: Treating RN: 1961/10/06 (60 y.o. M) Primary Care Provider: [name] Other Clinician: Referring Provider: Treating Provider/Extender: Avie Echevaria in Treatment: 8 History of Present Illness HPI Description: 10/31/2019 upon evaluation today patient appears to be doing somewhat poorly in regard to his bilateral plantar feet. He has wounds that he tells me have been present since 2012 intermittently off and on. Most recently this has been open for at least the past 6 months to a year. He has been trying to treat this in different ways using Santyl along with various other dressings including Medihoney and even at one point Xeroform. Nothing really has seem to get this completely closed. He was recently in the hospital for cellulitis of his leg subsequently he did have x-rays as well as MRIs that showed negative for any signs of osteomyelitis in regard to the wounds on his feet. Fortunately there is no signs of systemic infection at this time. No fevers, chills, nausea, vomiting, or diarrhea. Patient has previously used Darco offloading shoes as far as frontal floaters as well as postop surgical shoes. He has never been in a total contact cast that may be something we need to strongly consider here. Patient's most recent hemoglobin A1c 1 month ago was 5.3 seems to be very well controlled which is great. Subsequently he has seen vascular as well as podiatry. His ABIs are 1.07 on the left and 1.14 on the right he seems to be doing well he does have chronic venous stasis. 11/07/2019 upon evaluation today patient appears to be doing well with regard to his wounds all things considered. I do not see any severe worsening he still has some callus buildup on the right  more than the left he notes that he has been probably more active than he should as far as walking is concerned is just very hard to not be active. He knows he needs to be more careful in this regard however. He is willing to give the cast a try at this point although he notes that he is a little nervous about this just with regard to balance although he will be very careful and obviously if he has any trouble he knows to contact the office and let me know. 1/22; patient is in for his obligatory first total contact cast change. Our intake nurse reported a very large amount of drainage which is spelled out over to the surrounding skin. Has bilateral diabetic foot wounds. He has Charcot feet. We have been using silver alginate on his wounds. 11/14/2019 on evaluation today patient is actually seeming to make good improvement in regard to his bilateral plantar foot wounds. We have been using a cast on the left side and on the right side he has been using dressings he is changing up his own accord. With that being said he tells me that he is also not walking as much just due to how unsteady he feels. He takes it easy when he does have to walk and when he does not have to walk he is resting. This is probably help in his right foot as well has the left foot which is actually measuring better. In fact both are measuring better. Overall I am very pleased with how things seem to be progressing. The patient does have some odor on  the left foot this does have me concerned about the possibility of infection, and actually probably go ahead and put him on antibiotics today as well as utilizing a continuation of the cast on the left foot I think that will be fine we probably just need to bring him in sooner to change this not last a whole week. 1/29; we brought the patient back today for a total contact cast change on the left out of concern for excessive drainage. We are using drawtex over the wound as the primary  dressing 11/21/2019 on evaluation today patient appears to be doing well with regard to his left plantar foot. In fact both foot ulcers actually seem to be doing pretty well. Nonetheless he is having a lot of drainage on the left at this time and again we did obtain a wound culture did show positive for Staph aureus that was reviewed by myself today as well. Nonetheless he is on Bactrim which was shown to be sensitive that should be helping in this regard. Fortunately there is no signs of infection systemically at this point. 2/5; back in clinic today for a total contact cast change apparently secondary to very significant drainage. Still using drawtex 11/28/2019 upon evaluation today patient appears to be doing well with regard to his wounds. The right foot is doing okay as measured about the same in my opinion. The left foot is actually showing signs of significant improvement is measuring smaller there is a lot of hyper granulation likely due to the continued drainage at this point. We did obtain approval for a snap VAC I think that is good to be appropriate for him and will likely help this tremendously underneath the cast. He is definitely in agreement with proceeding with such. 2/12; patient came in today after his snap VAC lost suction. Brought in to see one of our nurses. The dressing was replaced and then we put the cast back on and rehooked up the snap VAC. Apparently his wound looked very good per our intake nurse. 2/15; again we replaced the cast on Friday. By Saturday the snap VAC and light suction. He called this morning he comes in acutely. The wounds look fine however the VAC is not functioning. We replaced the cast using silver alginate as the primary dressing backed with Kerramax. The snap VAC was not replaced 12/05/2019 upon evaluation today patient appears to be doing better in regard to his left plantar foot ulcer. Fortunately there is no signs of active infection at this time.  Unfortunately he is continuing to have issues with the right foot he is really not making any progress here things seem to be somewhat stagnant to be honest. The depth has increased but that is due to me having debrided the wound in the past based on what I am seeing. 12/12/2019 on evaluation today patient appears to be doing more poorly in regard to the left lower extremity. He has some erythema spreading up the side of his foot I am concerned about infection again at this point. Unfortunately he has been seeing improvement with a total contact cast but I do not think we should put that on today. On his right plantar foot he continues to have significant drainage this is actually measuring deeper I really do not feel like you are making any progress whatsoever. I have prescribed Granix for him unfortunately his insurance apparently was going to cost him a $500 co-pay. 12/19/2019 upon evaluation today patient actually appears to be doing  better in regard to both wounds. With that being said he actually did get the reGranix which he had to pay $500 for. With that being said it does look like that he is actually made some improvement based on what I am seeing at this point with the reGranix. Obviously if he is going to continue this we are going to do something about trying to get him some help in covering the cost. 12/26/2019 on evaluation today patient appears to be doing really much better even compared to last week. Overall the wound seems to be much better even compared to last week and last week was better than the week before. Since has been using the reGranix his symptoms have improved significantly. With that being said the issue right now is simply that this is a very expensive medication for him the first dose cost him $500. Upon inspection patient's wound bed actually is however dramatically improved compared to before he started this 2 weeks ago. 01/02/2020 upon evaluation today patient appears to  actually be doing well. He still had a little bit of reGranix left that has been using in small amounts he just been applying it every other day instead of every day in order to make it go longer. Overall we are still seeing excellent improvement he is measuring smaller looking better healthier tissue and everything seems to be pointing to this headed in the right direction. Fortunately there is no evidence of infection either which is also excellent news. He does have his MRI coming up within the next week. 01/09/2020 upon evaluation today patient appears to be doing a little worse today compared to previous week's evaluation. He is actually been out of the reGranix at this point. He has been trying to make the stretch out so he has been changing the dressings on a regular set schedule like he was previous. I think this has made a difference. Fortunately there is no signs of active infection at this time. No fevers, chills, nausea, vomiting, or diarrhea. 01/16/2020 upon evaluation today patient appears to be doing well with regard to his left plantar foot ulcer. The right plantar foot still shows some significant depth at this point. Fortunately there is no signs of active infection at this time. 01/30/2020 upon evaluation today patient appears to be doing about the same in regard to his right plantar foot ulcer there is still some depth here and we had to wait till he actually switched over to his new insurance to get approval for the MRI under his new insurance plan. With that being said he now has switched as of April 1. Fortunately there is no signs of active infection at this time. Overall in regard to his left foot ulcer this seems to be doing much better and I am actually very pleased with how things are going. With that being said it is not quite as much progress as we were seeing with the reGranix but at the same time he has had trouble getting this apparently there is been some hindrance here. I  Ernie Hew try to actually send this to [facility] that was recommended by the drug rep to me. 02/13/20 upon evaluation today patient appears to be doing better in regard to his left foot ulcer this is great news. Unfortunately the right foot ulcer is not really significantly better at this time. There is no signs of active infection systemically though he did have his MRI which showed unfortunately he does have infection noted  including an abscess in the foot. There is also marrow changes noted which are consistent with osteomyelitis based on the radiology review and interpretation. Unfortunately considering that the wound is not really making the progress that we will he would like to be seen I think that this is an indication that he may need some further referral both infectious disease as well as potentially to podiatrist to see if there is anything that can be done to help with the situation that were dealing with here. The left foot again is doing great. READMISSION 06/04/2021 This is a 60 year old man who was in the clinic in 2021 followed by Allen Derry for areas on the right and left foot. He developed a left foot infection and was referred to ID. He left the clinic in a nonhealed state and was followed for a period of [facility] Marylene Land. Apparently things really deteriorated in early July when he was admitted to hospital from 04/27/2021 through 05/06/2021 with sepsis secondary to a left foot infection. His blood cultures were negative. An MRI suggested fifth metatarsal osteomyelitis a left ankle septic joint. He was treated with vancomycin and ertapenem which he is still taking and may just about be finishing. He was seen by orthopedics and the patient adamantly refused to BKA. As far as I can tell he did not have the ankle aspirated I am not exactly sure what the issue was here. Since he has been discharged she is at [facility] for [facility]. He was last  seen by Dr. Algis Liming on 05/20/2021 he noted osteo of the tibial talar bone cuboid and fifth metatarsal which is even more extensive than what was suggested by the MRI. He is apparently going for a consultation with orthopedic surgery in [place] sometime next week. I received a call about this man 2 weeks ago from Dr. Allyson Sabal who follows him for the possibility of PAD. ABIs I think done in the office showed a ABI on the right of 1.05 at the PTA and 0.99 at the PTA on the left. He had a DVT rule out in the left leg that was negative for the DVT. Past medical history is extensive and includes diastolic heart failure, right first metatarsal head ulcer in 2021, excision of the right second ray by Dr. Marylene Land on 03/13/2020, hypertension, hypothyroidism. Left total hip replacement, right total knee replacement, carpal tunnel syndrome, obstructive sleep apnea alcohol abuse with cirrhosis although the patient denies current alcohol intake. The patient does not think he is a diabetic however looking through [place] link I see 2 HgbAic's of this year that were greater than equal to 6.5 which by definition makes him diabetic. Nevertheless he is not on any treatment and does not check his blood sugars. The patient is now back home out of the nursing home. Saw Dr. Algis Liming last week he was taken off vancomycin and ertapenem on August 22 and now is on doxycycline on Augmentin. He also saw Dr. Weston Anna who is his orthopedic surgeon in [place] he recommended a [employer]	[place]. He has been using Medihoney. 9/6I have been having trouble getting hyperbarics approved through our prior authorization process. Even though he had a limb threatening infection in the left foot and probably the left ankle there glitches in how some of the reports are worded also some of the consultants. In any case I am going to repeat his sedimentation rate and C-reactive protein. I am generally not in favor of doing things like  this as they really do  not alter the plan of care from my point of view however I am going to need to demonstrate that these remain high in order to get this through forhyperbaric treatment for chronic refractory osteomyelitis 9/13; following this man for a wound on his left plantar foot in the setting of type 2 diabetes and Charcot deformity. He has underlying chronic refractory osteomyelitis. Follow-up sedimentation rate and C-reactive protein were both elevated but the C-reactive protein was down to 1.4, sedimentation rate at 70. Sedimentation rate was only slightly down from previous at 85. His wound is measuring slightly smaller. 9/20; patient started hyperbaric oxygen therapy today and tolerated treatment well. This is for the underlying osteomyelitis. He remains on antibiotics but thinks he is getting close to finishing. The wound on the plantar aspect of his foot is the other issue we are following here. He is using Medihoney The patient has a Charcot foot in the setting of type 2 diabetes. He is going to need a total contact cast although his partner was away this week and we elected to delay this till next week 9/27; patient still tolerating hyperbaric oxygen well. Wound looked generally healthy not much depth under illumination still 100% covered in fibrinous debris. Raised callused edges around the wound he was prepared for a total contact cast. We have been using Medihoney 9/30; patient is back for his first obligatory total contact cast change. We are using Hydrofera Blue. Noted by our intake nurse to have a lot of drainage or at least a moderate amount of drainage. I am not sure I was previously aware of this 10/4; patient arrives today with a lot of drainage under the cast. When he had it changed last Friday there was also a similar amount of drainage. Her intake nurse says that they tried a wound VAC on him perhaps while I was on vacation in August under the cast but that did not work. In my experience that  has not been unusual. We have been using Hydrofera Blue with all the secondary absorbers. The drainage today went right through all of our dressings. The patient is concerned about his foot being in a cast without much drainage. He is tolerating HBO well. There has been improvements in the wound in the mid part of his foot in the setting of a Charcot deformity 10/7; patient presents for cast change. He has no issues or complaints today. He denies signs of infection. 10/11; patient presents for cast change. At this time he would like to take a break from the cast. He would like to do daily dressing changes with Hydrofera Blue. He currently denies signs of infection. 10/14; his cast was taken off last week at his request. He arrives in the clinic with [facility]. He has been changing the dressing himself. He has way too much edema in the left foot and leg to consider a total contact cast. I do not really know the issue here. He does have chronic venous insufficiency His wife stopped me in the clinic earlier in the week to report he is drinking again and she is concerned. I am uncertain whether there are other issues 10/18; he arrived in clinic last week having bilateral lower extremity edema likely secondary to chronic venous insufficiency there was too much edema in the left leg to apply a total contact cast. I put him in compression on the left leg to control the swelling. This week he cut the wrap on  the foot for reasons that are unclear however today he arrives in clinic with a smaller left leg but massive edema in the left foot. He is supposed to be wearing a juxta lite stocking on the right leg but he is not wearing the contact layer. His attendance at hyperbaric oxygen has been dwindling, he did not dive yesterday and he did not dive today concerned about hyperbarics causing swelling. I looked back in his record his last echo was in 2020 essentially normal left and right ventricular function.  Last BUN and creatinine were done in July this was normal. He is having a lot of drainage in the left plantar foot wound Electronic Signature(s) Signed: 08/04/2021 4:56:56 PM By: Baltazar Najjar MD Entered By: Baltazar Najjar on 08/04/2021 13:44:59 -------------------------------------------------------------------------------- Physical Exam Details Patient Name: Date of Service: [name] 08/04/2021 12:30 PM Medical Record Number: [id number] Patient Account Number: [id number] Date of Birth/Sex: Treating RN: 01-Mar-1961 (60 y.o. M) Primary Care Provider: [name] Other Clinician: Referring Provider: Treating Provider/Extender: Avie Echevaria in Treatment: 8 Constitutional Sitting or standing Blood Pressure is within target range for patient.. Pulse regular and within target range for patient.Marland Kitchen Respirations regular, non-labored and within target range.. Temperature is normal and within the target range for the patient.Marland Kitchen Appears in no distress. Cardiovascular Pedal pulses are palpable. Notes Wound exam; the plantar wound on the left foot looks about the same. Thick callus and skin around the wound margins the base of the wound does not look too bad and there is no surrounding erythema. He has massive edema in the left foot which is pitting likely from the inappropriate cutting of his wrap pushing fluid down to his foot. The left leg is smaller right leg also has the same amount of what looks to be chronic venous insufficiency Electronic Signature(s) Signed: 08/04/2021 4:56:56 PM By: Baltazar Najjar MD Entered By: Baltazar Najjar on 08/04/2021 13:46:11 -------------------------------------------------------------------------------- Physician Orders Details Patient Name: Date of Service: [name], [name] IG 08/04/2021 12:30 PM Medical Record Number: [id number] Patient Account Number: [id number] Date of Birth/Sex: Treating RN: [birth year] (60 y.o. [name] Primary Care Provider: [name] Other Clinician: Referring Provider: Treating Provider/Extender: Avie Echevaria in Treatment: 8 Verbal / Phone Orders: No Diagnosis Coding ICD-10 Coding Code Description E11.621 Type 2 diabetes mellitus with foot ulcer M14.672 Charcot's joint, left ankle and foot L97.528 Non-pressure chronic ulcer of other part of left foot with other specified severity M86.372 Chronic multifocal osteomyelitis, left ankle and foot Follow-up Appointments ppointment in 1 week. - Tuesday Dr. Leanord Hawking after Hyberbarics. Return A ***Leave compression wrap from foot to leg in place.**** Nurse Visit: - Friday morning after Hyberbarics. Bathing/ Shower/ Hygiene May shower and wash wound with soap and water. - prior to dressing change Edema Control - Lymphedema / SCD / Other Elevate legs to the level of the heart or above for 30 minutes daily and/or when sitting, a frequency of: - throughout the day Avoid standing for long periods of time. Exercise regularly Moisturize legs daily. Compression stocking or Garment 30-40 mm/Hg pressure to: - wear the juxtalite HD to right leg. apply in the morning and remove at night. Off-Loading Total Contact Cast to Left Lower Extremity - Size 4 Hold due to swelling. Additional Orders / Instructions Follow Nutritious Diet Hyperbaric Oxygen Therapy Wound #3 Left,Plantar Foot Evaluate for HBO Therapy - Starts 07/07/21 Indication: - Chronic Refractory Osteomyelitis 2.5 ATA for 90 Minutes with 2 Five (5) Minute A Breaks  ir Total Number of Treatments: - 40 One treatments per day (delivered Monday through Friday unless otherwise specified in Special Instructions below): Finger stick Blood Glucose Pre- and Post- HBOT Treatment. Follow Hyperbaric Oxygen Glycemia Protocol Afrin (Oxymetazoline HCL) 0.05% nasal spray - 1 spray in both nostrils daily as needed prior to HBO treatment for difficulty clearing ears Wound  Treatment Wound #3 - Foot Wound Laterality: Plantar, Left Cleanser: Soap and Water 1 x Per Week Discharge Instructions: May shower and wash wound with dial antibacterial soap and water prior to dressing change. Cleanser: Wound Cleanser 1 x Per Week Discharge Instructions: Cleanse the wound with wound cleanser prior to applying a clean dressing using gauze sponges, not tissue or cotton balls. Peri-Wound Care: Zinc Oxide Ointment 30g tube 1 x Per Week Discharge Instructions: Apply Zinc Oxide to periwound with each dressing change Prim Dressing: KerraCel Ag Gelling Fiber Dressing, 2x2 in (silver alginate) 1 x Per Week ary Discharge Instructions: Apply silver alginate to wound bed as instructed Secondary Dressing: ABD Pad, 5x9 1 x Per Week Discharge Instructions: Apply over primary dressing as directed. Secondary Dressing: Zetuvit Plus 4x8 in 1 x Per Week Discharge Instructions: Apply over primary dressing as directed. Secured With: 94M Medipore H Soft Cloth Surgical Tape, 2x2 (in/yd) 1 x Per Week Discharge Instructions: Secure dressing with tape as directed. Compression Wrap: FourPress (4 layer compression wrap) 1 x Per Week Discharge Instructions: Apply four layer compression as directed. May also use Miliken CoFlex 2 layer compression system as alternative. GLYCEMIA INTERVENTIONS PROTOCOL PRE-HBO GLYCEMIA INTERVENTIONS ACTION INTERVENTION Obtain pre-HBO capillary blood glucose (ensure 1 physician order is in chart). A. Notify HBO physician and await physician orders. 2 If result is 70 mg/dl or below: B. If the result meets the hospital definition of a critical result, follow hospital policy. A. Give patient an 8 ounce Glucerna Shake, an 8 ounce Ensure, or 8 ounces of a Glucerna/Ensure equivalent dietary supplement*. B. Wait 30 minutes. If result is 71 mg/dl to 161 mg/dl: C. Retest patients capillary blood glucose (CBG). D. If result greater than or equal to 110 mg/dl, proceed  with HBO. If result less than 110 mg/dl, notify HBO physician and consider holding HBO. If result is 131 mg/dl to 096 mg/dl: A. Proceed with HBO. A. Notify HBO physician and await physician orders. B. It is recommended to hold HBO and do If result is 250 mg/dl or greater: blood/urine ketone testing. C. If the result meets the hospital definition of a critical result, follow hospital policy. POST-HBO GLYCEMIA INTERVENTIONS ACTION INTERVENTION Obtain post HBO capillary blood glucose (ensure 1 physician order is in chart). A. Notify HBO physician and await physician orders. 2 If result is 70 mg/dl or below: B. If the result meets the hospital definition of a critical result, follow hospital policy. A. Give patient an 8 ounce Glucerna Shake, an 8 ounce Ensure, or 8 ounces of a Glucerna/Ensure equivalent dietary supplement*. B. Wait 15 minutes for symptoms of If result is 71 mg/dl to 045 mg/dl: hypoglycemia (i.e. nervousness, anxiety, sweating, chills, clamminess, irritability, confusion, tachycardia or dizziness). C. If patient asymptomatic, discharge patient. If patient symptomatic, repeat capillary blood glucose (CBG) and notify HBO physician. If result is 101 mg/dl to 409 mg/dl: A. Discharge patient. A. Notify HBO physician and await physician orders. B. It is recommended to do blood/urine ketone If result is 250 mg/dl or greater: testing. C. If the result meets the hospital definition of a critical result, follow hospital policy. *Juice or candies  are NOT equivalent products. If patient refuses the Glucerna or Ensure, please consult the hospital dietitian for an appropriate substitute. Electronic Signature(s) Signed: 08/04/2021 4:56:56 PM By: Baltazar Najjar MD Signed: 08/04/2021 5:13:39 PM By: Shawn Stall RN, BSN Entered By: Shawn Stall on 08/04/2021 13:29:50 -------------------------------------------------------------------------------- Problem List  Details Patient Name: Date of Service: [name], [name] IG 08/04/2021 12:30 PM Medical Record Number: [id number] Patient Account Number: [id number] Date of Birth/Sex: Treating RN: 1961-10-13 (60 y.o. [name] Primary Care Provider: [name] Other Clinician: Referring Provider: Treating Provider/Extender: Avie Echevaria in Treatment: 8 Active Problems ICD-10 Encounter Code Description Active Date MDM Diagnosis E11.621 Type 2 diabetes mellitus with foot ulcer 06/04/2021 No Yes M14.672 Charcot's joint, left ankle and foot 06/04/2021 No Yes L97.528 Non-pressure chronic ulcer of other part of left foot with other specified 06/04/2021 No Yes severity M86.372 Chronic multifocal osteomyelitis, left ankle and foot 06/04/2021 No Yes Inactive Problems Resolved Problems Electronic Signature(s) Signed: 08/04/2021 4:56:56 PM By: Baltazar Najjar MD Entered By: Baltazar Najjar on 08/04/2021 13:40:00 -------------------------------------------------------------------------------- Progress Note Details Patient Name: Date of Service: [name] 08/04/2021 12:30 PM Medical Record Number: [id number] Patient Account Number: [id number] Date of Birth/Sex: Treating RN: 1961-02-01 (60 y.o. M) Primary Care Provider: [name] Other Clinician: Referring Provider: Treating Provider/Extender: Avie Echevaria in Treatment: 8 Subjective History of Present Illness (HPI) 10/31/2019 upon evaluation today patient appears to be doing somewhat poorly in regard to his bilateral plantar feet. He has wounds that he tells me have been present since 2012 intermittently off and on. Most recently this has been open for at least the past 6 months to a year. He has been trying to treat this in different ways using Santyl along with various other dressings including Medihoney and even at one point Xeroform. Nothing really has seem to get this completely closed. He was  recently in the hospital for cellulitis of his leg subsequently he did have x-rays as well as MRIs that showed negative for any signs of osteomyelitis in regard to the wounds on his feet. Fortunately there is no signs of systemic infection at this time. No fevers, chills, nausea, vomiting, or diarrhea. Patient has previously used Darco offloading shoes as far as frontal floaters as well as postop surgical shoes. He has never been in a total contact cast that may be something we need to strongly consider here. Patient's most recent hemoglobin A1c 1 month ago was 5.3 seems to be very well controlled which is great. Subsequently he has seen vascular as well as podiatry. His ABIs are 1.07 on the left and 1.14 on the right he seems to be doing well he does have chronic venous stasis. 11/07/2019 upon evaluation today patient appears to be doing well with regard to his wounds all things considered. I do not see any severe worsening he still has some callus buildup on the right more than the left he notes that he has been probably more active than he should as far as walking is concerned is just very hard to not be active. He knows he needs to be more careful in this regard however. He is willing to give the cast a try at this point although he notes that he is a little nervous about this just with regard to balance although he will be very careful and obviously if he has any trouble he knows to contact the office and let me know. 1/22; patient is in for his obligatory first  total contact cast change. Our intake nurse reported a very large amount of drainage which is spelled out over to the surrounding skin. Has bilateral diabetic foot wounds. He has Charcot feet. We have been using silver alginate on his wounds. 11/14/2019 on evaluation today patient is actually seeming to make good improvement in regard to his bilateral plantar foot wounds. We have been using a cast on the left side and on the right side he  has been using dressings he is changing up his own accord. With that being said he tells me that he is also not walking as much just due to how unsteady he feels. He takes it easy when he does have to walk and when he does not have to walk he is resting. This is probably help in his right foot as well has the left foot which is actually measuring better. In fact both are measuring better. Overall I am very pleased with how things seem to be progressing. The patient does have some odor on the left foot this does have me concerned about the possibility of infection, and actually probably go ahead and put him on antibiotics today as well as utilizing a continuation of the cast on the left foot I think that will be fine we probably just need to bring him in sooner to change this not last a whole week. 1/29; we brought the patient back today for a total contact cast change on the left out of concern for excessive drainage. We are using drawtex over the wound as the primary dressing 11/21/2019 on evaluation today patient appears to be doing well with regard to his left plantar foot. In fact both foot ulcers actually seem to be doing pretty well. Nonetheless he is having a lot of drainage on the left at this time and again we did obtain a wound culture did show positive for Staph aureus that was reviewed by myself today as well. Nonetheless he is on Bactrim which was shown to be sensitive that should be helping in this regard. Fortunately there is no signs of infection systemically at this point. 2/5; back in clinic today for a total contact cast change apparently secondary to very significant drainage. Still using drawtex 11/28/2019 upon evaluation today patient appears to be doing well with regard to his wounds. The right foot is doing okay as measured about the same in my opinion. The left foot is actually showing signs of significant improvement is measuring smaller there is a lot of hyper granulation likely  due to the continued drainage at this point. We did obtain approval for a snap VAC I think that is good to be appropriate for him and will likely help this tremendously underneath the cast. He is definitely in agreement with proceeding with such. 2/12; patient came in today after his snap VAC lost suction. Brought in to see one of our nurses. The dressing was replaced and then we put the cast back on and rehooked up the snap VAC. Apparently his wound looked very good per our intake nurse. 2/15; again we replaced the cast on Friday. By Saturday the snap VAC and light suction. He called this morning he comes in acutely. The wounds look fine however the VAC is not functioning. We replaced the cast using silver alginate as the primary dressing backed with Kerramax. The snap VAC was not replaced 12/05/2019 upon evaluation today patient appears to be doing better in regard to his left plantar foot ulcer. Fortunately there is  no signs of active infection at this time. Unfortunately he is continuing to have issues with the right foot he is really not making any progress here things seem to be somewhat stagnant to be honest. The depth has increased but that is due to me having debrided the wound in the past based on what I am seeing. 12/12/2019 on evaluation today patient appears to be doing more poorly in regard to the left lower extremity. He has some erythema spreading up the side of his foot I am concerned about infection again at this point. Unfortunately he has been seeing improvement with a total contact cast but I do not think we should put that on today. On his right plantar foot he continues to have significant drainage this is actually measuring deeper I really do not feel like you are making any progress whatsoever. I have prescribed Granix for him unfortunately his insurance apparently was going to cost him a $500 co-pay. 12/19/2019 upon evaluation today patient actually appears to be doing better in  regard to both wounds. With that being said he actually did get the reGranix which he had to pay $500 for. With that being said it does look like that he is actually made some improvement based on what I am seeing at this point with the reGranix. Obviously if he is going to continue this we are going to do something about trying to get him some help in covering the cost. 12/26/2019 on evaluation today patient appears to be doing really much better even compared to last week. Overall the wound seems to be much better even compared to last week and last week was better than the week before. Since has been using the reGranix his symptoms have improved significantly. With that being said the issue right now is simply that this is a very expensive medication for him the first dose cost him $500. Upon inspection patient's wound bed actually is however dramatically improved compared to before he started this 2 weeks ago. 01/02/2020 upon evaluation today patient appears to actually be doing well. He still had a little bit of reGranix left that has been using in small amounts he just been applying it every other day instead of every day in order to make it go longer. Overall we are still seeing excellent improvement he is measuring smaller looking better healthier tissue and everything seems to be pointing to this headed in the right direction. Fortunately there is no evidence of infection either which is also excellent news. He does have his MRI coming up within the next week. 01/09/2020 upon evaluation today patient appears to be doing a little worse today compared to previous week's evaluation. He is actually been out of the reGranix at this point. He has been trying to make the stretch out so he has been changing the dressings on a regular set schedule like he was previous. I think this has made a difference. Fortunately there is no signs of active infection at this time. No fevers, chills, nausea, vomiting, or  diarrhea. 01/16/2020 upon evaluation today patient appears to be doing well with regard to his left plantar foot ulcer. The right plantar foot still shows some significant depth at this point. Fortunately there is no signs of active infection at this time. 01/30/2020 upon evaluation today patient appears to be doing about the same in regard to his right plantar foot ulcer there is still some depth here and we had to wait till he actually switched over  to his new insurance to get approval for the MRI under his new insurance plan. With that being said he now has switched as of April 1. Fortunately there is no signs of active infection at this time. Overall in regard to his left foot ulcer this seems to be doing much better and I am actually very pleased with how things are going. With that being said it is not quite as much progress as we were seeing with the reGranix but at the same time he has had trouble getting this apparently there is been some hindrance here. I Ernie Hew try to actually send this to [facility] that was recommended by the drug rep to me. 02/13/20 upon evaluation today patient appears to be doing better in regard to his left foot ulcer this is great news. Unfortunately the right foot ulcer is not really significantly better at this time. There is no signs of active infection systemically though he did have his MRI which showed unfortunately he does have infection noted including an abscess in the foot. There is also marrow changes noted which are consistent with osteomyelitis based on the radiology review and interpretation. Unfortunately considering that the wound is not really making the progress that we will he would like to be seen I think that this is an indication that he may need some further referral both infectious disease as well as potentially to podiatrist to see if there is anything that can be done to help with the situation that were dealing with here. The left foot  again is doing great. READMISSION 06/04/2021 This is a 59 year old man who was in the clinic in 2021 followed by [facility] for areas on the right and left foot. He developed a left foot infection and was referred to ID. He left the clinic in a nonhealed state and was followed for a period of [facility] Marylene Land. Apparently things really deteriorated in early July when he was admitted to hospital from 04/27/2021 through 05/06/2021 with sepsis secondary to a left foot infection. His blood cultures were negative. An MRI suggested fifth metatarsal osteomyelitis a left ankle septic joint. He was treated with vancomycin and ertapenem which he is still taking and may just about be finishing. He was seen by orthopedics and the patient adamantly refused to BKA. As far as I can tell he did not have the ankle aspirated I am not exactly sure what the issue was here. Since he has been discharged she is at [facility] health care for rehabilitation. He was last seen by Dr. Algis Liming on 05/20/2021 he noted osteo of the tibial talar bone cuboid and fifth metatarsal which is even more extensive than what was suggested by the MRI. He is apparently going for a consultation with orthopedic surgery in [place] sometime next week. I received a call about this man 2 weeks ago from Dr. Allyson Sabal who follows him for the possibility of PAD. ABIs I think done in the office showed a ABI on the right of 1.05 at the PTA and 0.99 at the PTA on the left. He had a DVT rule out in the left leg that was negative for the DVT. Past medical history is extensive and includes diastolic heart failure, right first metatarsal head ulcer in 2021, excision of the right second ray by Dr. Marylene Land on 03/13/2020, hypertension, hypothyroidism. Left total hip replacement, right total knee replacement, carpal tunnel syndrome, obstructive sleep apnea alcohol abuse with cirrhosis although the patient denies current  alcohol intake. The patient does  not think he is a diabetic however looking through [facility] link I see 2 HgbAic's of this year that were greater than equal to 6.5 which by definition makes him diabetic. Nevertheless he is not on any treatment and does not check his blood sugars. The patient is now back home out of the nursing home. Saw Dr. Algis Liming last week he was taken off vancomycin and ertapenem on August 22 and now is on doxycycline on Augmentin. He also saw Dr. Weston Anna who is his orthopedic surgeon in [place] he recommended a [employer]	[place]. He has been using Medihoney. 9/6I have been having trouble getting hyperbarics approved through our prior authorization process. Even though he had a limb threatening infection in the left foot and probably the left ankle there glitches in how some of the reports are worded also some of the consultants. In any case I am going to repeat his sedimentation rate and C-reactive protein. I am generally not in favor of doing things like this as they really do not alter the plan of care from my point of view however I am going to need to demonstrate that these remain high in order to get this through forhyperbaric treatment for chronic refractory osteomyelitis 9/13; following this man for a wound on his left plantar foot in the setting of type 2 diabetes and Charcot deformity. He has underlying chronic refractory osteomyelitis. Follow-up sedimentation rate and C-reactive protein were both elevated but the C-reactive protein was down to 1.4, sedimentation rate at 70. Sedimentation rate was only slightly down from previous at 85. His wound is measuring slightly smaller. 9/20; patient started hyperbaric oxygen therapy today and tolerated treatment well. This is for the underlying osteomyelitis. He remains on antibiotics but thinks he is getting close to finishing. The wound on the plantar aspect of his foot is the other issue we are following here. He is using Medihoney The patient has a Charcot foot  in the setting of type 2 diabetes. He is going to need a total contact cast although his partner was away this week and we elected to delay this till next week 9/27; patient still tolerating hyperbaric oxygen well. Wound looked generally healthy not much depth under illumination still 100% covered in fibrinous debris. Raised callused edges around the wound he was prepared for a total contact cast. We have been using Medihoney 9/30; patient is back for his first obligatory total contact cast change. We are using Hydrofera Blue. Noted by our intake nurse to have a lot of drainage or at least a moderate amount of drainage. I am not sure I was previously aware of this 10/4; patient arrives today with a lot of drainage under the cast. When he had it changed last Friday there was also a similar amount of drainage. Her intake nurse says that they tried a wound VAC on him perhaps while I was on vacation in August under the cast but that did not work. In my experience that has not been unusual. We have been using Hydrofera Blue with all the secondary absorbers. The drainage today went right through all of our dressings. The patient is concerned about his foot being in a cast without much drainage. He is tolerating HBO well. There has been improvements in the wound in the mid part of his foot in the setting of a Charcot deformity 10/7; patient presents for cast change. He has no issues or complaints today. He denies signs of infection. 10/11;  patient presents for cast change. At this time he would like to take a break from the cast. He would like to do daily dressing changes with Hydrofera Blue. He currently denies signs of infection. 10/14; his cast was taken off last week at his request. He arrives in the clinic with [facility]. He has been changing the dressing himself. He has way too much edema in the left foot and leg to consider a total contact cast. I do not really know the issue here. He does have  chronic venous insufficiency His wife stopped me in the clinic earlier in the week to report he is drinking again and she is concerned. I am uncertain whether there are other issues 10/18; he arrived in clinic last week having bilateral lower extremity edema likely secondary to chronic venous insufficiency there was too much edema in the left leg to apply a total contact cast. I put him in compression on the left leg to control the swelling. This week he cut the wrap on the foot for reasons that are unclear however today he arrives in clinic with a smaller left leg but massive edema in the left foot. He is supposed to be wearing a juxta lite stocking on the right leg but he is not wearing the contact layer. His attendance at hyperbaric oxygen has been dwindling, he did not dive yesterday and he did not dive today concerned about hyperbarics causing swelling. I looked back in his record his last echo was in 2020 essentially normal left and right ventricular function. Last BUN and creatinine were done in July this was normal. He is having a lot of drainage in the left plantar foot wound Objective Constitutional Sitting or standing Blood Pressure is within target range for patient.. Pulse regular and within target range for patient.Marland Kitchen Respirations regular, non-labored and within target range.. Temperature is normal and within the target range for the patient.Marland Kitchen Appears in no distress. Vitals Time Taken: 12:52 PM, Weight: 270 lbs, Temperature: 97.4 F, Pulse: 80 bpm, Respiratory Rate: 18 breaths/min, Blood Pressure: 136/83 mmHg, Capillary Blood Glucose: 153 mg/dl. Cardiovascular Pedal pulses are palpable. General Notes: Wound exam; the plantar wound on the left foot looks about the same. Thick callus and skin around the wound margins the base of the wound does not look too bad and there is no surrounding erythema. He has massive edema in the left foot which is pitting likely from the inappropriate  cutting of his wrap pushing fluid down to his foot. The left leg is smaller right leg also has the same amount of what looks to be chronic venous insufficiency Integumentary (Hair, Skin) Wound #3 status is Open. Original cause of wound was Gradually Appeared. The date acquired was: 03/18/2021. The wound has been in treatment 8 weeks. The wound is located on the Left,Plantar Foot. The wound measures 1.7cm length x 2.8cm width x 0.3cm depth; 3.738cm^2 area and 1.122cm^3 volume. There is Fat Layer (Subcutaneous Tissue) exposed. There is no tunneling noted, however, there is undermining starting at 12:00 and ending at 12:00 with a maximum distance of 0.6cm. There is a medium amount of serosanguineous drainage noted. The wound margin is well defined and not attached to the wound base. There is large (67-100%) red granulation within the wound bed. There is no necrotic tissue within the wound bed. General Notes: callous periwound. Assessment Active Problems ICD-10 Type 2 diabetes mellitus with foot ulcer Charcot's joint, left ankle and foot Non-pressure chronic ulcer of other part of  left foot with other specified severity Chronic multifocal osteomyelitis, left ankle and foot Procedures Wound #3 Pre-procedure diagnosis of Wound #3 is a Diabetic Wound/Ulcer of the Lower Extremity located on the Left,Plantar Foot . There was a Four Layer Compression Therapy Procedure by Shawn Stall, RN. Post procedure Diagnosis Wound #3: Same as Pre-Procedure Plan Follow-up Appointments: Return Appointment in 1 week. - Tuesday Dr. Leanord Hawking after Hyberbarics. ***Leave compression wrap from foot to leg in place.**** Nurse Visit: - Friday morning after Hyberbarics. Bathing/ Shower/ Hygiene: May shower and wash wound with soap and water. - prior to dressing change Edema Control - Lymphedema / SCD / Other: Elevate legs to the level of the heart or above for 30 minutes daily and/or when sitting, a frequency of: -  throughout the day Avoid standing for long periods of time. Exercise regularly Moisturize legs daily. Compression stocking or Garment 30-40 mm/Hg pressure to: - wear the juxtalite HD to right leg. apply in the morning and remove at night. Off-Loading: T Contact Cast to Left Lower Extremity - Size 4 Hold due to swelling. otal Additional Orders / Instructions: Follow Nutritious Diet Hyperbaric Oxygen Therapy: Wound #3 Left,Plantar Foot: Evaluate for HBO Therapy - Starts 07/07/21 Indication: - Chronic Refractory Osteomyelitis 2.5 ATA for 90 Minutes with 2 Five (5) Minute Air Breaks T Number of Treatments: - 40 otal One treatments per day (delivered Monday through Friday unless otherwise specified in Special Instructions below): Finger stick Blood Glucose Pre- and Post- HBOT Treatment. Follow Hyperbaric Oxygen Glycemia Protocol Afrin (Oxymetazoline HCL) 0.05% nasal spray - 1 spray in both nostrils daily as needed prior to HBO treatment for difficulty clearing ears WOUND #3: - Foot Wound Laterality: Plantar, Left Cleanser: Soap and Water 1 x Per Week/ Discharge Instructions: May shower and wash wound with dial antibacterial soap and water prior to dressing change. Cleanser: Wound Cleanser 1 x Per Week/ Discharge Instructions: Cleanse the wound with wound cleanser prior to applying a clean dressing using gauze sponges, not tissue or cotton balls. Peri-Wound Care: Zinc Oxide Ointment 30g tube 1 x Per Week/ Discharge Instructions: Apply Zinc Oxide to periwound with each dressing change Prim Dressing: KerraCel Ag Gelling Fiber Dressing, 2x2 in (silver alginate) 1 x Per Week/ ary Discharge Instructions: Apply silver alginate to wound bed as instructed Secondary Dressing: ABD Pad, 5x9 1 x Per Week/ Discharge Instructions: Apply over primary dressing as directed. Secondary Dressing: Zetuvit Plus 4x8 in 1 x Per Week/ Discharge Instructions: Apply over primary dressing as directed. Secured With:  20M Medipore H Soft Cloth Surgical T ape, 2x2 (in/yd) 1 x Per Week/ Discharge Instructions: Secure dressing with tape as directed. Com pression Wrap: FourPress (4 layer compression wrap) 1 x Per Week/ Discharge Instructions: Apply four layer compression as directed. May also use Miliken CoFlex 2 layer compression system as alternative. #1 too much swelling in the left foot to put a total contact cast on. I have rewrapped the left foot change the primary dressing to silver alginate because of drainage. I emphasized that I need to get a total contact cast on this but there is too much swelling in his left foot to do this 2. I suspect his bilateral edema is secondary to chronic venous insufficiency. I see no evidence clinically that would be suggestive of an acute DVT or cellulitis. He does not have heart failure cardiac exam seems normal. Respiratory exam clear 3. Before I put a total contact cast on this I would like to check a MolecuLight on  the plantar surface of the wound to rule out significant bioburden 4. I talked to him about hyperbarics his attendance is erratic and I asked him if he wanted to continue. 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 [name] Primary Care Provider: [name] Other Clinician: Referring Provider: Treating Provider/Extender: Avie Echevaria in Treatment: 8 Diagnosis Coding ICD-10 Codes Code Description E11.621 Type 2 diabetes mellitus with foot ulcer M14.672 Charcot's joint, left ankle  and foot L97.528 Non-pressure chronic ulcer of other part of left foot with other specified severity M86.372 Chronic multifocal osteomyelitis, left ankle and foot Facility Procedures CPT4 Code: [id number] Description: (Facility Use Only) [phone] - APPLY MULTLAY COMPRS LWR LT LEG Modifier: Quantity: 1 Physician Procedures : CPT4 Code Description Modifier [id number] - WC PHYS LEVEL 4 - EST PT ICD-10 Diagnosis Description E11.621 Type 2 diabetes mellitus with foot ulcer M14.672 Charcot's joint, left ankle and foot L97.528 Non-pressure chronic ulcer of other part of left  foot with other specified severity Quantity: 1 Electronic Signature(s) Signed: 08/04/2021 4:56:56 PM By: Baltazar Najjar MD Entered By: Baltazar Najjar on 08/04/2021 13:48:23

## 2021-08-04 NOTE — Progress Notes (Signed)
[name], [name] ([id number]) Visit Report for 08/04/2021 HBO Safety Checklist Details Patient Name: Date of Service: [name], [name] 08/04/2021 10:00 A M Medical Record Number: [id number] Patient Account Number: [id number] Date of Birth/Sex: Treating RN: 1961/09/07 (60 y.o. [name], [name] Primary Care Ezrie Bunyan: [name] Other Clinician: Haywood Pao Referring Rosalea Withrow: Treating Ferrah Panagopoulos/Extender: Avie Echevaria in Treatment: 8 HBO Safety Checklist Items Safety Checklist Consent Form Signed Patient voided / foley secured and emptied When did you last eato snack this AM Last dose of injectable or oral agent n/a Ostomy pouch emptied and vented if applicable NA All implantable devices assessed, documented and approved NA Intravenous access site secured and place NA Valuables secured Linens and cotton and cotton/polyester blend (less than 51% polyester) Personal oil-based products / skin lotions / body lotions removed Wigs or hairpieces removed NA Smoking or tobacco materials removed NA Books / newspapers / magazines / loose paper removed Cologne, aftershave, perfume and deodorant removed Jewelry removed (may wrap wedding band) NA Make-up removed NA Hair care products removed NA Battery operated devices (external) removed Heating patches and chemical warmers removed NA Titanium eyewear removed NA Nail polish cured greater than 10 hours NA Casting material cured greater than 10 hours NA Hearing aids removed NA Loose dentures or partials removed NA Prosthetics have been removed NA Patient demonstrates correct use of air break device (if applicable) Patient concerns have been addressed Patient grounding bracelet on and cord attached to chamber Specifics for Inpatients (complete in addition to above) Medication sheet sent with patient NA Intravenous medications needed or due during therapy sent with patient NA Drainage tubes (e.g.  nasogastric tube or chest tube secured and vented) NA Endotracheal or Tracheotomy tube secured NA Cuff deflated of air and inflated with saline NA Airway suctioned NA Notes Patient states that he isn't sure he wants to have a hyperbaric treatment today because it seems that the treatment is causing his legs and feet to swell. Electronic Signature(s) Signed: 08/04/2021 1:38:35 PM By: Haywood Pao EMT Entered By: Haywood Pao on 08/04/2021 13:38:35

## 2021-08-04 NOTE — Progress Notes (Signed)
[name] ([id number]) Visit Report for 08/04/2021 Arrival Information Details Patient Name: Date of Service: [name], [name] 08/04/2021 12:30 PM Medical Record Number: [id number] Patient Account Number: [id number] Date of Birth/Sex: Treating RN: [birth year] (60 y.o. [name] Primary Care Nickolaus Bordelon: Billey Gosling Other Clinician: Referring Esra Frankowski: Treating Delylah Stanczyk/Extender: Genella Rife in Treatment: 8 Visit Information History Since Last Visit Added or deleted any medications: No Patient Arrived: [name] Any new allergies or adverse reactions: No Arrival Time: 12:52 Had a fall or experienced change in No Accompanied By: self activities of daily living that may affect Transfer Assistance: None risk of falls: Patient Identification Verified: Yes Signs or symptoms of abuse/neglect since last visito No Secondary Verification Process Completed: Yes Hospitalized since last visit: No Patient Requires Transmission-Based Precautions: No Implantable device outside of the clinic excluding No Patient Has Alerts: Yes cellular tissue based products placed in the center since last visit: Has Dressing in Place as Prescribed: No Has Compression in Place as Prescribed: Yes Pain Present Now: No Notes Patient removed the foot portion of compression wrap to left foot wound and applied more dressing. MD made aware. Patient declined hyberbarics today related to feeling it was causing swelling in BLE. Electronic Signature(s) Signed: 08/04/2021 5:13:39 PM By: Deon Pilling RN, BSN Entered By: Deon Pilling on 08/04/2021 12:57:57 -------------------------------------------------------------------------------- Compression Therapy Details Patient Name: Date of Service: [name] IG 08/04/2021 12:30 PM Medical Record Number: [id number] Patient Account Number: [id number] Date of Birth/Sex: Treating RN: 03-29-1961 (60 y.o. [name] Primary Care Emran Molzahn: Billey Gosling Other Clinician: Referring Robin Pafford: Treating Henson Fraticelli/Extender: Genella Rife in Treatment: 8 Compression Therapy Performed for Wound Assessment: Wound #3 Left,Plantar Foot Performed By: Clinician Deon Pilling, RN Compression Type: Four Layer Post Procedure Diagnosis Same as Pre-procedure Electronic Signature(s) Signed: 08/04/2021 5:13:39 PM By: Deon Pilling RN, BSN Entered By: Deon Pilling on 08/04/2021 13:30:28 -------------------------------------------------------------------------------- Encounter Discharge Information Details Patient Name: Date of Service: [name] IG 08/04/2021 12:30 PM Medical Record Number: [id number] Patient Account Number: [id number] Date of Birth/Sex: Treating RN: [birth year] (60 y.o. [name] Primary Care Micky Sheller: Billey Gosling Other Clinician: Referring Reegan Mctighe: Treating Leahna Hewson/Extender: Genella Rife in Treatment: 8 Encounter Discharge Information Items Discharge Condition: Stable Ambulatory Status: Walker Discharge Destination: Home Transportation: Private Auto Accompanied By: self Schedule Follow-up Appointment: Yes Clinical Summary of Care: Electronic Signature(s) Signed: 08/04/2021 5:13:39 PM By: Deon Pilling RN, BSN Entered By: Deon Pilling on 08/04/2021 13:31:26 -------------------------------------------------------------------------------- Lower Extremity Assessment Details Patient Name: Date of Service: [name] IG 08/04/2021 12:30 PM Medical Record Number: [id number] Patient Account Number: [id number] Date of Birth/Sex: Treating RN: 1960/11/13 (60 y.o. [name] Primary Care Kylan Veach: Billey Gosling Other Clinician: Referring Sharissa Brierley: Treating Brendia Dampier/Extender: Genella Rife in Treatment: 8 Edema Assessment Assessed: Shirlyn Goltz: Yes] [Right: Yes] Edema: [Left: Yes] [Right: Yes] Calf Left: Right: Point of Measurement: 36 cm From  Medial Instep 41 cm 46 cm Ankle Left: Right: Point of Measurement: 13 cm From Medial Instep 26 cm 32.5 cm Knee To Floor Left: Right: From Medial Instep 49 cm Vascular Assessment Pulses: Dorsalis Pedis Palpable: [Left:Yes] [Right:Yes] Electronic Signature(s) Signed: 08/04/2021 5:13:39 PM By: Deon Pilling RN, BSN Entered By: Deon Pilling on 08/04/2021 13:03:22 -------------------------------------------------------------------------------- Multi Wound Chart Details Patient Name: Date of Service: [name] IG 08/04/2021 12:30 PM Medical Record Number: [id number] Patient Account Number: [id number] Date of Birth/Sex: Treating RN: May 29, 1961 (60 y.o. M) Primary Care Joshia Kitchings: Billey Gosling Other Clinician: Referring Arul Farabee:  Treating Makai Agostinelli/Extender: Genella Rife in Treatment: 8 Vital Signs Height(in): Capillary Blood Glucose(mg/dl): 153 Weight(lbs): 270 Pulse(bpm): 93 Body Mass Index(BMI): Blood Pressure(mmHg): 136/83 Temperature(F): 97.4 Respiratory Rate(breaths/min): 18 Photos: [N/A:N/A] Left, Plantar Foot N/A N/A Wound Location: Gradually Appeared N/A N/A Wounding Event: Diabetic Wound/Ulcer of the Lower N/A N/A Primary Etiology: Extremity Anemia, Sleep Apnea, Congestive N/A N/A Comorbid History: Heart Failure, Hypertension, Peripheral Venous Disease, Cirrhosis , Type II Diabetes, Osteoarthritis, Neuropathy 03/18/2021 N/A N/A Date Acquired: 8 N/A N/A Weeks of Treatment: Open N/A N/A Wound Status: 1.7x2.8x0.3 N/A N/A Measurements L x W x D (cm) 3.738 N/A N/A A (cm) : rea 1.122 N/A N/A Volume (cm) : 47.10% N/A N/A % Reduction in A rea: -58.70% N/A N/A % Reduction in Volume: 12 Starting Position 1 (o'clock): 12 Ending Position 1 (o'clock): 0.6 Maximum Distance 1 (cm): Yes N/A N/A Undermining: Grade 3 N/A N/A Classification: Medium N/A N/A Exudate A mount: Serosanguineous N/A N/A Exudate Type: red, brown N/A  N/A Exudate Color: Well defined, not attached N/A N/A Wound Margin: Large (67-100%) N/A N/A Granulation A mount: Red N/A N/A Granulation Quality: None Present (0%) N/A N/A Necrotic A mount: Fat Layer (Subcutaneous Tissue): Yes N/A N/A Exposed Structures: Fascia: No Tendon: No Muscle: No Joint: No Bone: No Small (1-33%) N/A N/A Epithelialization: callous periwound. N/A N/A Assessment Notes: Compression Therapy N/A N/A Procedures Performed: Treatment Notes Wound #3 (Foot) Wound Laterality: Plantar, Left Cleanser Soap and Water Discharge Instruction: May shower and wash wound with dial antibacterial soap and water prior to dressing change. Wound Cleanser Discharge Instruction: Cleanse the wound with wound cleanser prior to applying a clean dressing using gauze sponges, not tissue or cotton balls. Peri-Wound Care Zinc Oxide Ointment 30g tube Discharge Instruction: Apply Zinc Oxide to periwound with each dressing change Topical Primary Dressing KerraCel Ag Gelling Fiber Dressing, 2x2 in (silver alginate) Discharge Instruction: Apply silver alginate to wound bed as instructed Secondary Dressing ABD Pad, 5x9 Discharge Instruction: Apply over primary dressing as directed. Zetuvit Plus 4x8 in Discharge Instruction: Apply over primary dressing as directed. Secured With 2M [place] Surgical T ape, 2x2 (in/yd) Discharge Instruction: Secure dressing with tape as directed. Compression Wrap FourPress (4 layer compression wrap) Discharge Instruction: Apply four layer compression as directed. May also use Miliken CoFlex 2 layer compression system as alternative. Compression Stockings Add-Ons Electronic Signature(s) Signed: 08/04/2021 4:56:56 PM By: Linton Ham MD Entered By: Linton Ham on 08/04/2021 13:40:07 -------------------------------------------------------------------------------- Multi-Disciplinary Care Plan Details Patient Name: Date of  Service: VINH, SACHS IG 08/04/2021 12:30 PM Medical Record Number: [id number] Patient Account Number: [id number] Date of Birth/Sex: Treating RN: Sep 21, 1961 (60 y.o. [name] Primary Care Marbin Olshefski: Billey Gosling Other Clinician: Referring Yanelle Sousa: Treating Glendale Youngblood/Extender: Genella Rife in Treatment: 8 Multidisciplinary Care Plan reviewed with physician Active Inactive HBO Nursing Diagnoses: Anxiety related to feelings of confinement associated with the hyperbaric oxygen chamber Anxiety related to knowledge deficit of hyperbaric oxygen therapy and treatment procedures Discomfort related to temperature and humidity changes inside hyperbaric chamber Potential for barotraumas to ears, sinuses, teeth, and lungs or cerebral gas embolism related to changes in atmospheric pressure inside hyperbaric oxygen chamber Potential for oxygen toxicity seizures related to delivery of 100% oxygen at an increased atmospheric pressure Potential for pulmonary oxygen toxicity related to delivery of 100% oxygen at an increased atmospheric pressure Goals: Barotrauma will be prevented during HBO2 Date Initiated: 06/04/2021 Target Resolution Date: 09/10/2021 Goal Status: Active Patient and/or family will be  able to state/discuss factors appropriate to the management of their disease process during treatment Date Initiated: 06/04/2021 Date Inactivated: 08/04/2021 Target Resolution Date: 08/14/2021 Goal Status: Met Patient will tolerate the hyperbaric oxygen therapy treatment Date Initiated: 06/04/2021 Target Resolution Date: 08/14/2021 Goal Status: Active Patient will tolerate the internal climate of the chamber Date Initiated: 06/04/2021 Target Resolution Date: 08/14/2021 Goal Status: Active Patient/caregiver will verbalize understanding of HBO goals, rationale, procedures and potential hazards Date Initiated: 06/04/2021 Date Inactivated: 07/28/2021 Target Resolution Date:  08/14/2021 Goal Status: Met Signs and symptoms of pulmonary oxygen toxicity will be recognized and promptly addressed Date Initiated: 06/04/2021 Target Resolution Date: 08/14/2021 Goal Status: Active Signs and symptoms of seizure will be recognized and promptly addressed ; seizing patients will suffer no harm Date Initiated: 06/04/2021 Target Resolution Date: 08/14/2021 Goal Status: Active Interventions: Administer a five (5) minute air break for patient if signs and symptoms of seizure appear and notify the hyperbaric physician Administer decongestants, per physician orders, prior to HBO2 Administer the correct therapeutic gas delivery based on the patients needs and limitations, per physician order Assess and provide for patients comfort related to the hyperbaric environment and equalization of middle ear Assess for signs and symptoms related to adverse events, including but not limited to confinement anxiety, pneumothorax, oxygen toxicity and baurotrauma Assess patient for any history of confinement anxiety Assess patient's knowledge and expectations regarding hyperbaric medicine and provide education related to the hyperbaric environment, goals of treatment and prevention of adverse events Implement protocols to decrease risk of pneumothorax in high risk patients Notes: Nutrition Nursing Diagnoses: Impaired glucose control: actual or potential Potential for alteratiion in Nutrition/Potential for imbalanced nutrition Goals: Patient/caregiver agrees to and verbalizes understanding of need to use nutritional supplements and/or vitamins as prescribed Date Initiated: 06/04/2021 Date Inactivated: 07/07/2021 Target Resolution Date: 07/04/2021 Goal Status: Met Patient/caregiver will maintain therapeutic glucose control Date Initiated: 06/04/2021 Target Resolution Date: 08/14/2021 Goal Status: Active Interventions: Assess HgA1c results as ordered upon admission and as needed Assess patient  nutrition upon admission and as needed per policy Provide education on elevated blood sugars and impact on wound healing Notes: 07/07/21: Glucose control ongoing Electronic Signature(s) Signed: 08/04/2021 5:13:39 PM By: Deon Pilling RN, BSN Entered By: Deon Pilling on 08/04/2021 13:11:08 -------------------------------------------------------------------------------- Pain Assessment Details Patient Name: Date of Service: [name] IG 08/04/2021 12:30 PM Medical Record Number: [id number] Patient Account Number: [id number] Date of Birth/Sex: Treating RN: Dec 21, 1960 (60 y.o. [name] Primary Care Xadrian Craighead: Billey Gosling Other Clinician: Referring Rob Mciver: Treating Ellason Segar/Extender: Genella Rife in Treatment: 8 Active Problems Location of Pain Severity and Description of Pain Patient Has Paino No Site Locations Rate the pain. Current Pain Level: 0 Pain Management and Medication Current Pain Management: Medication: No Cold Application: No Rest: No Massage: No Activity: No T.E.N.S.: No Heat Application: No Leg drop or elevation: No Is the Current Pain Management Adequate: Adequate How does your wound impact your activities of daily livingo Sleep: No Bathing: No Appetite: No Relationship With Others: No Bladder Continence: No Emotions: No Bowel Continence: No Work: No Toileting: No Drive: No Dressing: No Hobbies: No Engineer, maintenance) Signed: 08/04/2021 5:13:39 PM By: Deon Pilling RN, BSN Entered By: Deon Pilling on 08/04/2021 12:59:16 -------------------------------------------------------------------------------- Patient/Caregiver Education Details Patient Name: Date of Service: Emelia Salisbury 10/18/2022andnbsp12:30 PM Medical Record Number: [id number] Patient Account Number: [id number] Date of Birth/Gender: Treating RN: Oct 18, 1961 (60 y.o. [name] Primary Care Physician: Billey Gosling Other Clinician: Referring  Physician: Treating Physician/Extender: Dellia Nims  Nicholas Lose in Treatment: 8 Education Assessment Education Provided To: Patient Education Topics Provided Elevated Blood Sugar/ Impact on Healing: Handouts: Elevated Blood Sugars: How Do They Affect Wound Healing Methods: Explain/Verbal Responses: Reinforcements needed Electronic Signature(s) Signed: 08/04/2021 5:13:39 PM By: Deon Pilling RN, BSN Entered By: Deon Pilling on 08/04/2021 13:11:50 -------------------------------------------------------------------------------- Wound Assessment Details Patient Name: Date of Service: BALEY, LORIMER IG 08/04/2021 12:30 PM Medical Record Number: [id number] Patient Account Number: [id number] Date of Birth/Sex: Treating RN: 12/06/1960 (60 y.o. [name] Primary Care Starkisha Tullis: Billey Gosling Other Clinician: Referring Randale Carvalho: Treating Kierria Feigenbaum/Extender: Genella Rife in Treatment: 8 Wound Status Wound Number: 3 Primary Diabetic Wound/Ulcer of the Lower Extremity Etiology: Wound Location: Left, Plantar Foot Wound Open Wounding Event: Gradually Appeared Status: Date Acquired: 03/18/2021 Comorbid Anemia, Sleep Apnea, Congestive Heart Failure, Hypertension, Weeks Of Treatment: 8 History: Peripheral Venous Disease, Cirrhosis , Type II Diabetes, Clustered Wound: No Osteoarthritis, Neuropathy Photos Wound Measurements Length: (cm) 1.7 Width: (cm) 2.8 Depth: (cm) 0.3 Area: (cm) 3.738 Volume: (cm) 1.122 % Reduction in Area: 47.1% % Reduction in Volume: -58.7% Epithelialization: Small (1-33%) Tunneling: No Undermining: Yes Starting Position (o'clock): 12 Ending Position (o'clock): 12 Maximum Distance: (cm) 0.6 Wound Description Classification: Grade 3 Wound Margin: Well defined, not attached Exudate Amount: Medium Exudate Type: Serosanguineous Exudate Color: red, brown Foul Odor After Cleansing: No Slough/Fibrino No Wound  Bed Granulation Amount: Large (67-100%) Exposed Structure Granulation Quality: Red Fascia Exposed: No Necrotic Amount: None Present (0%) Fat Layer (Subcutaneous Tissue) Exposed: Yes Tendon Exposed: No Muscle Exposed: No Joint Exposed: No Bone Exposed: No Assessment Notes callous periwound. Treatment Notes Wound #3 (Foot) Wound Laterality: Plantar, Left Cleanser Soap and Water Discharge Instruction: May shower and wash wound with dial antibacterial soap and water prior to dressing change. Wound Cleanser Discharge Instruction: Cleanse the wound with wound cleanser prior to applying a clean dressing using gauze sponges, not tissue or cotton balls. Peri-Wound Care Zinc Oxide Ointment 30g tube Discharge Instruction: Apply Zinc Oxide to periwound with each dressing change Topical Primary Dressing KerraCel Ag Gelling Fiber Dressing, 2x2 in (silver alginate) Discharge Instruction: Apply silver alginate to wound bed as instructed Secondary Dressing ABD Pad, 5x9 Discharge Instruction: Apply over primary dressing as directed. Zetuvit Plus 4x8 in Discharge Instruction: Apply over primary dressing as directed. Secured With 49M [place] Surgical T ape, 2x2 (in/yd) Discharge Instruction: Secure dressing with tape as directed. Compression Wrap FourPress (4 layer compression wrap) Discharge Instruction: Apply four layer compression as directed. May also use Miliken CoFlex 2 layer compression system as alternative. Compression Stockings Add-Ons Electronic Signature(s) Signed: 08/04/2021 5:13:39 PM By: Deon Pilling RN, BSN Entered By: Deon Pilling on 08/04/2021 13:11:44 -------------------------------------------------------------------------------- Vitals Details Patient Name: Date of Service: [name] IG 08/04/2021 12:30 PM Medical Record Number: [id number] Patient Account Number: [id number] Date of Birth/Sex: Treating RN: 08-12-1961 (60 y.o. M) Primary Care Icelyn Navarrete:  Billey Gosling Other Clinician: Referring Yareni Creps: Treating Edouard Gikas/Extender: Genella Rife in Treatment: 8 Vital Signs Time Taken: 12:52 Temperature (F): 97.4 Weight (lbs): 270 Pulse (bpm): 80 Respiratory Rate (breaths/min): 18 Blood Pressure (mmHg): 136/83 Capillary Blood Glucose (mg/dl): 153 Reference Range: 80 - 120 mg / dl Electronic Signature(s) Signed: 08/04/2021 5:13:39 PM By: Deon Pilling RN, BSN Entered By: Deon Pilling on 08/04/2021 12:59:06

## 2021-08-05 ENCOUNTER — Encounter (HOSPITAL_BASED_OUTPATIENT_CLINIC_OR_DEPARTMENT_OTHER): Payer: Medicare Other | Admitting: Physician Assistant

## 2021-08-06 ENCOUNTER — Other Ambulatory Visit: Payer: Self-pay

## 2021-08-06 ENCOUNTER — Encounter (HOSPITAL_BASED_OUTPATIENT_CLINIC_OR_DEPARTMENT_OTHER): Payer: Medicare Other | Admitting: Internal Medicine

## 2021-08-06 DIAGNOSIS — E11621 Type 2 diabetes mellitus with foot ulcer: Secondary | ICD-10-CM | POA: Diagnosis not present

## 2021-08-06 LAB — GLUCOSE, CAPILLARY
Glucose-Capillary: 116 mg/dL — ABNORMAL HIGH (ref 70–99)
Glucose-Capillary: 119 mg/dL — ABNORMAL HIGH (ref 70–99)
Glucose-Capillary: 102 mg/dL — ABNORMAL HIGH (ref 70–99)

## 2021-08-06 NOTE — Progress Notes (Addendum)
LINDSEY, HOMMEL ([id number]) Visit Report for 08/04/2021 Arrival Information Details Patient Name: Date of Service: [name], [name] 08/04/2021 10:00 A M Medical Record Number: [id number] Patient Account Number: [id number] Date of Birth/Sex: Treating RN: 1960-12-14 (60 y.o. [name] Primary Care Rafaella Kole: [name] Other Clinician: Haywood Pao Referring Bernise Sylvain: Treating Riona Lahti/Extender: Avie Echevaria in Treatment: 8 Visit Information History Since Last Visit All ordered tests and consults were completed: Yes Patient Arrived: [name] Added or deleted any medications: No Arrival Time: 10:39 Any new allergies or adverse reactions: No Accompanied By: self Had a fall or experienced change in No Transfer Assistance: Manual activities of daily living that may affect Patient Identification Verified: Yes risk of falls: Secondary Verification Process Completed: Yes Signs or symptoms of abuse/neglect since last visito No Patient Requires Transmission-Based Precautions: No Hospitalized since last visit: No Patient Has Alerts: Yes Implantable device outside of the clinic excluding No cellular tissue based products placed in the center since last visit: Pain Present Now: No Electronic Signature(s) Signed: 08/04/2021 1:34:46 PM By: Haywood Pao EMT Entered By: Haywood Pao on 08/04/2021 13:34:45 -------------------------------------------------------------------------------- Encounter Discharge Information Details Patient Name: Date of Service: [name] 08/04/2021 10:00 A M Medical Record Number: [id number] Patient Account Number: [id number] Date of Birth/Sex: Treating RN: [birth year] (60 y.o. [name] Primary Care Holston Oyama: [name] Other Clinician: Haywood Pao Referring Annie Roseboom: Treating Khamya Topp/Extender: Avie Echevaria in Treatment: 8 Encounter Discharge Information Items Discharge  Condition: Stable Ambulatory Status: Walker Discharge Destination: Other (Note Required) Transportation: Other Accompanied By: self Schedule Follow-up Appointment: No Clinical Summary of Care: Notes Patient has wound care appointment at 1230 Electronic Signature(s) Signed: 08/04/2021 1:48:50 PM By: Haywood Pao EMT Entered By: Haywood Pao on 08/04/2021 13:48:50 -------------------------------------------------------------------------------- Vitals Details Patient Name: Date of Service: [name] 08/04/2021 10:00 A M Medical Record Number: [id number] Patient Account Number: [id number] Date of Birth/Sex: Treating RN: March 21, 1961 (60 y.o. [name] Primary Care Marrio Scribner: [name] Other Clinician: Haywood Pao Referring Sheamus Hasting: Treating Aryanne Gilleland/Extender: Avie Echevaria in Treatment: 8 Vital Signs Time Taken: 10:46 Temperature (F): 97.7 Weight (lbs): 270 Pulse (bpm): 100 Respiratory Rate (breaths/min): 18 Blood Pressure (mmHg): 112/61 Capillary Blood Glucose (mg/dl): 242 Reference Range: 80 - 120 mg / dl Electronic Signature(s) Signed: 08/04/2021 1:35:37 PM By: Haywood Pao EMT Entered By: Haywood Pao on 08/04/2021 13:35:37

## 2021-08-06 NOTE — Progress Notes (Addendum)
[name] ([id number]) Visit Report for 08/06/2021 Arrival Information Details Patient Name: Date of Service: [name], [name] 08/06/2021 10:00 A M Medical Record Number: [id number] Patient Account Number: [id number] Date of Birth/Sex: Treating RN: 1961-06-12 (60 y.o. [name], [name] Primary Care Nuha Degner: [name] Other Clinician: Haywood Pao Referring Sarea Fyfe: Treating Sena Clouatre/Extender: Avie Echevaria in Treatment: 9 Visit Information History Since Last Visit All ordered tests and consults were completed: Yes Patient Arrived: [name] Added or deleted any medications: No Arrival Time: 10:06 Any new allergies or adverse reactions: No Accompanied By: girlfriend Had a fall or experienced change in No Transfer Assistance: None activities of daily living that may affect Patient Identification Verified: Yes risk of falls: Secondary Verification Process Completed: Yes Signs or symptoms of abuse/neglect since last visito No Patient Requires Transmission-Based Precautions: No Hospitalized since last visit: No Patient Has Alerts: Yes Implantable device outside of the clinic excluding No cellular tissue based products placed in the center since last visit: Pain Present Now: No Electronic Signature(s) Signed: 08/06/2021 2:03:27 PM By: Haywood Pao EMT Entered By: Haywood Pao on 08/06/2021 14:03:27 -------------------------------------------------------------------------------- Encounter Discharge Information Details Patient Name: Date of Service: [name] 08/06/2021 10:00 A M Medical Record Number: [id number] Patient Account Number: [id number] Date of Birth/Sex: Treating RN: 24-Feb-1961 (60 y.o. [name] Primary Care Torianna Junio: [name] Other Clinician: Haywood Pao Referring Dorian Renfro: Treating Delcie Ruppert/Extender: Avie Echevaria in Treatment: 9 Encounter Discharge Information Items Discharge  Condition: Stable Ambulatory Status: Walker Discharge Destination: Home Transportation: Private Auto Accompanied By: girlfriend Schedule Follow-up Appointment: No Clinical Summary of Care: Electronic Signature(s) Signed: 08/06/2021 2:17:05 PM By: Haywood Pao EMT Entered By: Haywood Pao on 08/06/2021 14:17:05 -------------------------------------------------------------------------------- Vitals Details Patient Name: Date of Service: [name] 08/06/2021 10:00 A M Medical Record Number: [id number] Patient Account Number: [id number] Date of Birth/Sex: Treating RN: [birth year] (60 y.o. [name] Primary Care Davide Risdon: [name] Other Clinician: Haywood Pao Referring Searra Carnathan: Treating Tishanna Dunford/Extender: Avie Echevaria in Treatment: 9 Vital Signs Time Taken: 10:16 Temperature (F): 97.7 Weight (lbs): 270 Pulse (bpm): 105 Respiratory Rate (breaths/min): 18 Blood Pressure (mmHg): 144/67 Capillary Blood Glucose (mg/dl): 350 Reference Range: 80 - 120 mg / dl Electronic Signature(s) Signed: 08/06/2021 2:06:29 PM By: Haywood Pao EMT Entered By: Haywood Pao on 08/06/2021 14:06:29

## 2021-08-06 NOTE — Progress Notes (Signed)
[name], [name] ([id number]) Visit Report for 08/06/2021 SuperBill Details Patient Name: Date of Service: [name], [name] 08/06/2021 Medical Record Number: [id number] Patient Account Number: [id number] Date of Birth/Sex: Treating RN: 22-Mar-1961 (60 y.o. [name], [name] Primary Care Provider: [name] Other Clinician: Haywood Pao Referring Provider: Treating Provider/Extender: Avie Echevaria in Treatment: 9 Diagnosis Coding ICD-10 Codes Code Description E11.621 Type 2 diabetes mellitus with foot ulcer M14.672 Charcot's joint, left ankle and foot L97.528 Non-pressure chronic ulcer of other part of left foot with other specified severity M86.372 Chronic multifocal osteomyelitis, left ankle and foot Facility Procedures CPT4 Code Description Modifier Quantity [id number] G0277-(Facility Use Only) HBOT full body chamber, , 4 ICD-10 Diagnosis Description E11.621 Type 2 diabetes mellitus with foot ulcer M14.672 Charcot's joint, left ankle and foot Physician Procedures Quantity CPT4 Code Description Modifier [id number] - WC PHYS HYPERBARIC OXYGEN THERAPY 1 ICD-10 Diagnosis Description E11.621 Type 2 diabetes mellitus with foot ulcer M14.672 Charcot's joint, left ankle and foot Electronic Signature(s) Signed: 08/06/2021 2:16:19 PM By: Haywood Pao EMT Signed: 08/06/2021 5:12:42 PM By: Baltazar Najjar MD Entered By: Haywood Pao on 08/06/2021 14:16:19

## 2021-08-06 NOTE — Progress Notes (Addendum)
[name], [name] ([id number]) Visit Report for 08/06/2021 HBO Details Patient Name: Date of Service: [name], [name] 08/06/2021 10:00 A M Medical Record Number: [id number] Patient Account Number: [id number] Date of Birth/Sex: Treating RN: Feb 08, 1961 (60 y.o. [name] Primary Care Ylianna Almanzar: [name] Other Clinician: Haywood Pao Referring Merilyn Pagan: Treating Blakeleigh Domek/Extender: Avie Echevaria in Treatment: 9 HBO Treatment Course Details Treatment Course Number: 1 Ordering Aniqua Briere: Baltazar Najjar T Treatments Ordered: otal 40 HBO Treatment Start Date: 07/07/2021 HBO Indication: Chronic Refractory Osteomyelitis to Left,Plantar Foot HBO Treatment Details Treatment Number: 17 Patient Type: Outpatient Chamber Type: Monoplace Chamber Serial #: [id number] Treatment Protocol: 2.5 ATA with 90 minutes oxygen, with two 5 minute air breaks Treatment Details Compression Rate Down: 2.0 psi / minute De-Compression Rate Up: 2.0 psi / minute A breaks and breathing ir Compress Tx Pressure periods Decompress Decompress Begins Reached (leave unused spaces Begins Ends blank) Chamber Pressure (ATA 1 2.5 2.5 2.5 2.5 2.5 - - 2.5 1 ) Clock Time (24 hr) 10:58 11:09 11:39 11:44 12:15 12:20 - - 12:50 13:01 Treatment Length: 123 (minutes) Treatment Segments: 4 Vital Signs Capillary Blood Glucose Reference Range: 80 - 120 mg / dl HBO Diabetic Blood Glucose Intervention Range: <131 mg/dl or >629 mg/dl Type: Time Vitals Blood Pulse: Respiratory Temperature: Capillary Blood Glucose Pulse Action Taken: Pressure: Rate: Glucose (mg/dl): Meter #: Oximetry (%) Taken: Pre 10:16 144/67 105 18 97.7 116 2 8 oz Glucerna Shake given Post 13:04 123 108 18 97.6 102 2 discharge per protocol >101 mg/dL Treatment Response Treatment Toleration: Well Treatment Completion Status: Treatment Completed without Adverse Event Gwendolynn Merkey Notes No concerns with treatment given Physician HBO  Attestation: I certify that I supervised this HBO treatment in accordance with Medicare guidelines. A trained emergency response team is readily available per Yes hospital policies and procedures. Continue HBOT as ordered. Yes Electronic Signature(s) Signed: 08/06/2021 4:46:06 PM By: Haywood Pao EMT Signed: 08/06/2021 5:12:42 PM By: Baltazar Najjar MD Previous Signature: 08/06/2021 2:15:55 PM Version By: Haywood Pao EMT Entered By: Haywood Pao on 08/06/2021 16:46:05 -------------------------------------------------------------------------------- HBO Safety Checklist Details Patient Name: Date of Service: [name], [name] 08/06/2021 10:00 A M Medical Record Number: [id number] Patient Account Number: [id number] Date of Birth/Sex: Treating RN: 1961-03-20 (60 y.o. [name], [name] Primary Care Rojelio Uhrich: [name] Other Clinician: Haywood Pao Referring Judee Hennick: Treating Tosh Glaze/Extender: Avie Echevaria in Treatment: 9 HBO Safety Checklist Items Safety Checklist Consent Form Signed Patient voided / foley secured and emptied When did you last eato protein shake this morning Last dose of injectable or oral agent n/a Ostomy pouch emptied and vented if applicable NA All implantable devices assessed, documented and approved NA Intravenous access site secured and place NA Valuables secured Linens and cotton and cotton/polyester blend (less than 51% polyester) Personal oil-based products / skin lotions / body lotions removed Wigs or hairpieces removed NA Smoking or tobacco materials removed NA Books / newspapers / magazines / loose paper removed Cologne, aftershave, perfume and deodorant removed Jewelry removed (may wrap wedding band) NA Make-up removed NA Hair care products removed Battery operated devices (external) removed Heating patches and chemical warmers removed Titanium eyewear removed NA Nail polish cured greater than 10  hours NA Casting material cured greater than 10 hours NA Hearing aids removed NA Loose dentures or partials removed NA Prosthetics have been removed NA Patient demonstrates correct use of air break device (if applicable) Patient concerns have been addressed Patient grounding bracelet on and cord attached to chamber  Specifics for Inpatients (complete in addition to above) Medication sheet sent with patient NA Intravenous medications needed or due during therapy sent with patient NA Drainage tubes (e.g. nasogastric tube or chest tube secured and vented) NA Endotracheal or Tracheotomy tube secured NA Cuff deflated of air and inflated with saline NA Airway suctioned NA Electronic Signature(s) Signed: 08/06/2021 2:13:36 PM By: Haywood Pao EMT Entered By: Haywood Pao on 08/06/2021 14:13:36

## 2021-08-07 ENCOUNTER — Encounter (HOSPITAL_BASED_OUTPATIENT_CLINIC_OR_DEPARTMENT_OTHER): Payer: Self-pay

## 2021-08-07 ENCOUNTER — Encounter (HOSPITAL_BASED_OUTPATIENT_CLINIC_OR_DEPARTMENT_OTHER): Payer: Medicare Other | Admitting: Internal Medicine

## 2021-08-07 DIAGNOSIS — L97528 Non-pressure chronic ulcer of other part of left foot with other specified severity: Secondary | ICD-10-CM

## 2021-08-07 DIAGNOSIS — E11621 Type 2 diabetes mellitus with foot ulcer: Secondary | ICD-10-CM | POA: Diagnosis not present

## 2021-08-07 DIAGNOSIS — M86372 Chronic multifocal osteomyelitis, left ankle and foot: Secondary | ICD-10-CM

## 2021-08-07 LAB — GLUCOSE, CAPILLARY
Glucose-Capillary: 142 mg/dL — ABNORMAL HIGH (ref 70–99)
Glucose-Capillary: 127 mg/dL — ABNORMAL HIGH (ref 70–99)

## 2021-08-10 ENCOUNTER — Other Ambulatory Visit: Payer: Self-pay

## 2021-08-10 ENCOUNTER — Encounter (HOSPITAL_BASED_OUTPATIENT_CLINIC_OR_DEPARTMENT_OTHER): Payer: Medicare Other | Admitting: Internal Medicine

## 2021-08-10 DIAGNOSIS — M86372 Chronic multifocal osteomyelitis, left ankle and foot: Secondary | ICD-10-CM | POA: Diagnosis not present

## 2021-08-10 DIAGNOSIS — L97528 Non-pressure chronic ulcer of other part of left foot with other specified severity: Secondary | ICD-10-CM | POA: Diagnosis not present

## 2021-08-10 DIAGNOSIS — E11621 Type 2 diabetes mellitus with foot ulcer: Secondary | ICD-10-CM

## 2021-08-10 DIAGNOSIS — M14672 Charcot's joint, left ankle and foot: Secondary | ICD-10-CM

## 2021-08-10 LAB — GLUCOSE, CAPILLARY
Glucose-Capillary: 178 mg/dL — ABNORMAL HIGH (ref 70–99)
Glucose-Capillary: 102 mg/dL — ABNORMAL HIGH (ref 70–99)

## 2021-08-10 NOTE — Progress Notes (Addendum)
[name], [name] ([id number]) Visit Report for 08/10/2021 Arrival Information Details Patient Name: Date of Service: [name], [name] 08/10/2021 10:00 A M Medical Record Number: [id number] Patient Account Number: [id number] Date of Birth/Sex: Treating RN: 1961-05-19 (60 y.o. [name] Primary Care Xochitl Egle: [name] Other Clinician: Haywood Pao Referring Kipp Shank: Treating Inaya Gillham/Extender: [name] in Treatment: 9 Visit Information History Since Last Visit All ordered tests and consults were completed: Yes Patient Arrived: [name] Added or deleted any medications: No Arrival Time: 10:07 Any new allergies or adverse reactions: No Accompanied By: self Had a fall or experienced change in No Transfer Assistance: None activities of daily living that may affect Patient Identification Verified: Yes risk of falls: Secondary Verification Process Completed: Yes Signs or symptoms of abuse/neglect since last visito No Patient Requires Transmission-Based Precautions: No Hospitalized since last visit: No Patient Has Alerts: Yes Implantable device outside of the clinic excluding No cellular tissue based products placed in the center since last visit: Pain Present Now: No Electronic Signature(s) Signed: 08/10/2021 12:45:27 PM By: Haywood Pao EMT Entered By: Haywood Pao on 08/10/2021 12:45:27 -------------------------------------------------------------------------------- Encounter Discharge Information Details Patient Name: Date of Service: [name] 08/10/2021 10:00 A M Medical Record Number: [id number] Patient Account Number: [id number] Date of Birth/Sex: Treating RN: 07/09/1961 (60 y.o. [name] Primary Care Joeph Szatkowski: [name] Other Clinician: Haywood Pao Referring Wandy Bossler: Treating Sebastien Jackson/Extender: [name] in Treatment: 9 Encounter Discharge Information Items Discharge  Condition: Stable Ambulatory Status: Walker Discharge Destination: Home Transportation: Private Auto Accompanied By: self Schedule Follow-up Appointment: No Clinical Summary of Care: Electronic Signature(s) Signed: 08/10/2021 1:40:43 PM By: Haywood Pao EMT Entered By: Haywood Pao on 08/10/2021 13:40:42 -------------------------------------------------------------------------------- Vitals Details Patient Name: Date of Service: [name] 08/10/2021 10:00 A M Medical Record Number: [id number] Patient Account Number: [id number] Date of Birth/Sex: Treating RN: 22-Nov-1960 (60 y.o. [name] Primary Care Kista Robb: [name] Other Clinician: Haywood Pao Referring Jamisha Hoeschen: Treating Adrinne Sze/Extender: Morley Kos Weeks in Treatment: 9 Vital Signs Time Taken: 10:11 Temperature (F): 98.1 Weight (lbs): 270 Pulse (bpm): 115 Respiratory Rate (breaths/min): 20 Blood Pressure (mmHg): 160/86 Capillary Blood Glucose (mg/dl): 312 Reference Range: 80 - 120 mg / dl Electronic Signature(s) Signed: 08/10/2021 12:47:30 PM By: Haywood Pao EMT Entered By: Haywood Pao on 08/10/2021 12:47:30

## 2021-08-10 NOTE — Progress Notes (Signed)
MIQUAN, TANDON ([id number]) Visit Report for 08/10/2021 SuperBill Details Patient Name: Date of Service: [name], [name] 08/10/2021 Medical Record Number: [id number] Patient Account Number: [id number] Date of Birth/Sex: Treating RN: 1961/01/22 (60 y.o. [name] Primary Care Provider: [name] Other Clinician: Haywood Pao Referring Provider: Treating Provider/Extender: [name] in Treatment: 9 Diagnosis Coding ICD-10 Codes Code Description E11.621 Type 2 diabetes mellitus with foot ulcer M14.672 Charcot's joint, left ankle and foot L97.528 Non-pressure chronic ulcer of other part of left foot with other specified severity M86.372 Chronic multifocal osteomyelitis, left ankle and foot Facility Procedures CPT4 Code Description Modifier Quantity [id number] G0277-(Facility Use Only) HBOT full body chamber, , 4 ICD-10 Diagnosis Description E11.621 Type 2 diabetes mellitus with foot ulcer M14.672 Charcot's joint, left ankle and foot L97.528 Non-pressure chronic ulcer of other part of left foot with other specified severity M86.372 Chronic multifocal osteomyelitis, left ankle and foot Physician Procedures Quantity CPT4 Code Description Modifier [id number] 99183 - WC PHYS HYPERBARIC OXYGEN THERAPY 1 ICD-10 Diagnosis Description E11.621 Type 2 diabetes mellitus with foot ulcer M14.672 Charcot's joint, left ankle and foot L97.528 Non-pressure chronic ulcer of other part of left foot with other specified severity M86.372 Chronic multifocal osteomyelitis, left ankle and foot Electronic Signature(s) Signed: 08/10/2021 1:40:12 PM By: Haywood Pao EMT Signed: 08/10/2021 4:24:08 PM By: [name] DO Entered By: Haywood Pao on 08/10/2021 13:40:12

## 2021-08-10 NOTE — Progress Notes (Addendum)
TAIT, BALISTRERI ([id number]) Visit Report for 08/10/2021 HBO Details Patient Name: Date of Service: [name], [name] 08/10/2021 10:00 A M Medical Record Number: [id number] Patient Account Number: [id number] Date of Birth/Sex: Treating RN: 20-Aug-1961 (60 y.o. [name] Primary Care Myleen Brailsford: [name] Other Clinician: Haywood Pao Referring Tynesia Harral: Treating Erika Slaby/Extender: [name] in Treatment: 9 HBO Treatment Course Details Treatment Course Number: 1 Ordering Jakara Blatter: Baltazar Najjar T Treatments Ordered: otal 40 HBO Treatment Start Date: 07/07/2021 HBO Indication: Chronic Refractory Osteomyelitis to Left,Plantar Foot HBO Treatment Details Treatment Number: 19 Patient Type: Outpatient Chamber Type: Monoplace Chamber Serial #: [id number] Treatment Protocol: 2.5 ATA with 90 minutes oxygen, with two 5 minute air breaks Treatment Details Compression Rate Down: 1.5 psi / minute De-Compression Rate Up: 1.5 psi / minute A breaks and breathing ir Compress Tx Pressure periods Decompress Decompress Begins Reached (leave unused spaces Begins Ends blank) Chamber Pressure (ATA 1 2.5 2.5 2.5 2.5 2.5 - - 2.5 1 ) Clock Time (24 hr) 10:46 11:00 11:30 11:35 12:05 12:10 - - 12:40 13:00 Treatment Length: 134 (minutes) Treatment Segments: 4 Vital Signs Capillary Blood Glucose Reference Range: 80 - 120 mg / dl HBO Diabetic Blood Glucose Intervention Range: <131 mg/dl or >782 mg/dl Type: Time Vitals Blood Pulse: Respiratory Temperature: Capillary Blood Glucose Pulse Action Taken: Pressure: Rate: Glucose (mg/dl): Meter #: Oximetry (%) Taken: Pre 10:11 160/86 115 20 98.1 178 Post 13:02 132/70 96 18 98 102 Patient discharged per [facility] protocol. Treatment Response Treatment Toleration: Well Treatment Completion Status: Treatment Completed without Adverse Event Physician HBO Attestation: I certify that I supervised this HBO treatment in accordance with  Medicare guidelines. A trained emergency response team is readily available per Yes hospital policies and procedures. Continue HBOT as ordered. Yes Electronic Signature(s) Signed: 08/10/2021 4:24:08 PM By: [name] DO Previous Signature: 08/10/2021 1:39:16 PM Version By: Haywood Pao EMT Entered By: [name] on 08/10/2021 16:21:59 -------------------------------------------------------------------------------- HBO Safety Checklist Details Patient Name: Date of Service: [name], [name] 08/10/2021 10:00 A M Medical Record Number: [id number] Patient Account Number: [id number] Date of Birth/Sex: Treating RN: Feb 27, 1961 (60 y.o. [name] Primary Care Leanard Dimaio: [name] Other Clinician: Haywood Pao Referring Claudia Alvizo: Treating Taylie Helder/Extender: [name] in Treatment: 9 HBO Safety Checklist Items Safety Checklist Consent Form Signed Patient voided / foley secured and emptied When did you last eato snack this AM Last dose of injectable or oral agent n/a Ostomy pouch emptied and vented if applicable NA All implantable devices assessed, documented and approved NA Intravenous access site secured and place NA Valuables secured NA Linens and cotton and cotton/polyester blend (less than 51% polyester) Personal oil-based products / skin lotions / body lotions removed Wigs or hairpieces removed NA Smoking or tobacco materials removed NA Books / newspapers / magazines / loose paper removed Cologne, aftershave, perfume and deodorant removed Jewelry removed (may wrap wedding band) Make-up removed NA Hair care products removed Hair Elastic Battery operated devices (external) removed Heating patches and chemical warmers removed Titanium eyewear removed NA Nail polish cured greater than 10 hours NA Casting material cured greater than 10 hours NA Hearing aids removed NA Loose dentures or partials  removed NA Prosthetics have been removed NA Patient demonstrates correct use of air break device (if applicable) Patient concerns have been addressed Patient grounding bracelet on and cord attached to chamber Specifics for Inpatients (complete in addition to above) Medication sheet sent with patient NA Intravenous medications needed or due during therapy  sent with patient NA Drainage tubes (e.g. nasogastric tube or chest tube secured and vented) NA Endotracheal or Tracheotomy tube secured NA Cuff deflated of air and inflated with saline NA Airway suctioned NA Electronic Signature(s) Signed: 08/10/2021 12:48:48 PM By: Haywood Pao EMT Entered By: Haywood Pao on 08/10/2021 12:48:47

## 2021-08-10 NOTE — Progress Notes (Signed)
CASPER, PAGLIUCA ([id number]) Visit Report for 08/07/2021 Arrival Information Details Patient Name: Date of Service: [name], [name] 08/07/2021 10:00 A M Medical Record Number: [id number] Patient Account Number: [id number] Date of Birth/Sex: Treating RN: 1960/11/15 (60 y.o. Charlean Merl, Lauren Primary Care Elmon Shader: [name] Other Clinician: Haywood Pao Referring Zitlaly Malson: Treating Rhandi Despain/Extender: [name] in Treatment: 9 Visit Information History Since Last Visit All ordered tests and consults were completed: Yes Patient Arrived: [name] Added or deleted any medications: No Arrival Time: 10:04 Any new allergies or adverse reactions: No Accompanied By: self Had a fall or experienced change in No Transfer Assistance: None activities of daily living that may affect Patient Identification Verified: Yes risk of falls: Secondary Verification Process Completed: Yes Signs or symptoms of abuse/neglect since last visito No Patient Requires Transmission-Based Precautions: No Hospitalized since last visit: No Patient Has Alerts: Yes Implantable device outside of the clinic excluding No cellular tissue based products placed in the center since last visit: Pain Present Now: No Electronic Signature(s) Signed: 08/07/2021 1:27:32 PM By: Haywood Pao EMT Entered By: Haywood Pao on 08/07/2021 13:27:32 -------------------------------------------------------------------------------- Encounter Discharge Information Details Patient Name: Date of Service: [name] 08/07/2021 10:00 A M Medical Record Number: [id number] Patient Account Number: [id number] Date of Birth/Sex: Treating RN: 02/08/1961 (60 y.o. Charlean Merl, Lauren Primary Care Mily Malecki: [name] Other Clinician: Haywood Pao Referring Eldora Napp: Treating Loys Hoselton/Extender: [name] in Treatment: 9 Encounter Discharge Information Items Discharge  Condition: Stable Ambulatory Status: Walker Discharge Destination: Home Transportation: Private Auto Accompanied By: girlfriend Schedule Follow-up Appointment: No Clinical Summary of Care: Electronic Signature(s) Signed: 08/07/2021 1:55:24 PM By: Haywood Pao EMT Entered By: Haywood Pao on 08/07/2021 13:55:24 -------------------------------------------------------------------------------- Vitals Details Patient Name: Date of Service: [name] 08/07/2021 10:00 A M Medical Record Number: [id number] Patient Account Number: [id number] Date of Birth/Sex: Treating RN: [birth year] (60 y.o. Charlean Merl, Lauren Primary Care Dylyn Mclaren: [name] Other Clinician: Haywood Pao Referring Tracee Mccreery: Treating Kaven Cumbie/Extender: Morley Kos Weeks in Treatment: 9 Vital Signs Time Taken: 10:08 Temperature (F): 97.7 Weight (lbs): 270 Pulse (bpm): 102 Respiratory Rate (breaths/min): 18 Blood Pressure (mmHg): 119/70 Capillary Blood Glucose (mg/dl): 353 Reference Range: 80 - 120 mg / dl Electronic Signature(s) Signed: 08/07/2021 1:44:11 PM By: Haywood Pao EMT Previous Signature: 08/07/2021 1:43:29 PM Version By: Haywood Pao EMT Entered By: Haywood Pao on 08/07/2021 13:44:11

## 2021-08-10 NOTE — Progress Notes (Addendum)
[name], [name] ([id number]) Visit Report for 08/07/2021 HBO Details Patient Name: Date of Service: [name], [name] 08/07/2021 10:00 A M Medical Record Number: [id number] Patient Account Number: [id number] Date of Birth/Sex: Treating RN: [birth year] (60 y.o. [name], [name] Primary Care Chena Chohan: [name] Other Clinician: Haywood Pao Referring Jerimyah Vandunk: Treating Silvie Obremski/Extender: [name] in Treatment: 9 HBO Treatment Course Details Treatment Course Number: 1 Ordering Jarel Cuadra: Baltazar Najjar T Treatments Ordered: otal 40 HBO Treatment Start Date: 07/07/2021 HBO Indication: Chronic Refractory Osteomyelitis to Left,Plantar Foot HBO Treatment Details Treatment Number: 18 Patient Type: Outpatient Chamber Type: Monoplace Chamber Serial #: [id number] Treatment Protocol: 2.5 ATA with 90 minutes oxygen, with two 5 minute air breaks Treatment Details Compression Rate Down: 1.5 psi / minute De-Compression Rate Up: 2.0 psi / minute A breaks and breathing ir Compress Tx Pressure periods Decompress Decompress Begins Reached (leave unused spaces Begins Ends blank) Chamber Pressure (ATA 1 2.5 2.5 2.5 2.5 2.5 - - 2.5 1 ) Clock Time (24 hr) 10:50 11:13 11:43 11:48 12:18 12:23 - - 12:53 13:04 Treatment Length: 134 (minutes) Treatment Segments: 4 Vital Signs Capillary Blood Glucose Reference Range: 80 - 120 mg / dl HBO Diabetic Blood Glucose Intervention Range: <131 mg/dl or >782 mg/dl Type: Time Vitals Blood Pulse: Respiratory Temperature: Capillary Blood Glucose Pulse Action Taken: Pressure: Rate: Glucose (mg/dl): Meter #: Oximetry (%) Taken: Pre 10:08 119/70 102 18 97.7 142 Post 13:07 132/75 98 18 97.4 127 discharge per protocol >101 mg/dL Treatment Response Treatment Toleration: Well Treatment Completion Status: Treatment Completed without Adverse Event Treatment Notes Patient was compressed at a slower rate today due to inability to equalize  his left middle ear as quickly as normal. Pressure was reduced twice to allow equalization. Once at 7psi and again at 17 psi. The slower rate of compression allowed patient to equalize pressure effectively. Physician HBO Attestation: I certify that I supervised this HBO treatment in accordance with Medicare guidelines. A trained emergency response team is readily available per Yes hospital policies and procedures. Continue HBOT as ordered. Yes Electronic Signature(s) Signed: 08/10/2021 4:28:21 PM By: [name] DO Previous Signature: 08/07/2021 1:51:24 PM Version By: Haywood Pao EMT Entered By: [name] on 08/10/2021 16:25:49 -------------------------------------------------------------------------------- HBO Safety Checklist Details Patient Name: Date of Service: [name] IG 08/07/2021 10:00 A M Medical Record Number: [id number] Patient Account Number: [id number] Date of Birth/Sex: Treating RN: [birth year] (60 y.o. [name], [name] Primary Care Melisa Donofrio: [name] Other Clinician: Haywood Pao Referring Carys [name]: Treating [name]/Extender: [name] in Treatment: 9 HBO Safety Checklist Items Safety Checklist Consent Form Signed Patient voided / foley secured and emptied When did you last eato 0900 Last dose of injectable or oral agent n/a Ostomy pouch emptied and vented if applicable NA All implantable devices assessed, documented and approved NA Intravenous access site secured and place NA Valuables secured Linens and cotton and cotton/polyester blend (less than 51% polyester) Personal oil-based products / skin lotions / body lotions removed Wigs or hairpieces removed NA Smoking or tobacco materials removed NA Books / newspapers / magazines / loose paper removed Cologne, aftershave, perfume and deodorant removed Jewelry removed (may wrap wedding band) Make-up removed NA Hair care products  removed NA Battery operated devices (external) removed Heating patches and chemical warmers removed Titanium eyewear removed NA Nail polish cured greater than 10 hours NA Casting material cured greater than 10 hours NA Hearing aids removed NA Loose dentures or partials removed NA Prosthetics have been removed NA  Patient demonstrates correct use of air break device (if applicable) Patient concerns have been addressed Patient grounding bracelet on and cord attached to chamber Specifics for Inpatients (complete in addition to above) Medication sheet sent with patient NA Intravenous medications needed or due during therapy sent with patient NA Drainage tubes (e.g. nasogastric tube or chest tube secured and vented) NA Endotracheal or Tracheotomy tube secured NA Cuff deflated of air and inflated with saline NA Airway suctioned NA Notes Paper version used prior to treatment Electronic Signature(s) Signed: 08/07/2021 1:46:40 PM By: Haywood Pao EMT Entered By: Haywood Pao on 08/07/2021 13:46:39

## 2021-08-11 ENCOUNTER — Encounter (HOSPITAL_BASED_OUTPATIENT_CLINIC_OR_DEPARTMENT_OTHER): Payer: Medicare Other | Admitting: Internal Medicine

## 2021-08-11 DIAGNOSIS — E11621 Type 2 diabetes mellitus with foot ulcer: Secondary | ICD-10-CM | POA: Diagnosis not present

## 2021-08-11 NOTE — Progress Notes (Addendum)
LEODAN, BOLYARD ([id number]) Visit Report for 08/11/2021 Arrival Information Details Patient Name: Date of Service: [name], [name] 08/11/2021 2:45 PM Medical Record Number: [id number] Patient Account Number: [id number] Date of Birth/Sex: Treating RN: January 06, 1961 (60 y.o. [name], Lauren Primary Care Nekeisha Aure: Billey Gosling Other Clinician: Referring Cullan Launer: Treating Karthik Whittinghill/Extender: Genella Rife in Treatment: 9 Visit Information History Since Last Visit Added or deleted any medications: No Patient Arrived: [name] Any new allergies or adverse reactions: No Arrival Time: 15:14 Had a fall or experienced change in No Accompanied By: self activities of daily living that may affect Transfer Assistance: Manual risk of falls: Patient Identification Verified: Yes Signs or symptoms of abuse/neglect since last visito No Secondary Verification Process Completed: Yes Hospitalized since last visit: No Patient Requires Transmission-Based Precautions: No Implantable device outside of the clinic excluding No Patient Has Alerts: Yes cellular tissue based products placed in the center since last visit: Has Dressing in Place as Prescribed: Yes Has Compression in Place as Prescribed: Yes Pain Present Now: No Electronic Signature(s) Signed: 08/11/2021 5:23:14 PM By: Rhae Hammock RN Entered By: Rhae Hammock on 08/11/2021 15:14:22 -------------------------------------------------------------------------------- Encounter Discharge Information Details Patient Name: Date of Service: [name] 08/11/2021 2:45 PM Medical Record Number: [id number] Patient Account Number: [id number] Date of Birth/Sex: Treating RN: 1961/08/30 (60 y.o. [name] Primary Care Emrys Mceachron: Billey Gosling Other Clinician: Referring Meital Riehl: Treating Asia Favata/Extender: Genella Rife in Treatment: 9 Encounter Discharge Information Items Post Procedure  Vitals Discharge Condition: Stable Temperature (F): 97.4 Ambulatory Status: Walker Pulse (bpm): 74 Discharge Destination: Home Respiratory Rate (breaths/min): 17 Transportation: Private Auto Blood Pressure (mmHg): 134/74 Accompanied By: self Schedule Follow-up Appointment: Yes Clinical Summary of Care: Patient Declined Electronic Signature(s) Signed: 08/11/2021 5:23:14 PM By: Rhae Hammock RN Entered By: Rhae Hammock on 08/11/2021 16:28:50 -------------------------------------------------------------------------------- Lower Extremity Assessment Details Patient Name: Date of Service: [name], [name] 08/11/2021 2:45 PM Medical Record Number: [id number] Patient Account Number: [id number] Date of Birth/Sex: Treating RN: Apr 30, 1961 (60 y.o. [name] Primary Care Declyn Delsol: Billey Gosling Other Clinician: Referring Karion Cudd: Treating Amesha Bailey/Extender: Genella Rife in Treatment: 9 Edema Assessment Assessed: Shirlyn Goltz: Yes] [name]: No] Edema: [Left: Yes] [Right: Yes] Calf Left: Right: Point of Measurement: 36 cm From Medial Instep 41 cm 46 cm Ankle Left: Right: Point of Measurement: 13 cm From Medial Instep 26 cm 32.5 cm Vascular Assessment Pulses: Dorsalis Pedis Palpable: [Left:Yes] Posterior Tibial Palpable: [Left:Yes] Electronic Signature(s) Signed: 08/11/2021 5:23:14 PM By: Rhae Hammock RN Entered By: Rhae Hammock on 08/11/2021 15:21:11 -------------------------------------------------------------------------------- Multi Wound Chart Details Patient Name: Date of Service: [name] 08/11/2021 2:45 PM Medical Record Number: [id number] Patient Account Number: [id number] Date of Birth/Sex: Treating RN: 21-Nov-1960 (60 y.o. [name], Lauren Primary Care Tiny Rietz: Billey Gosling Other Clinician: Referring Vere Diantonio: Treating Shakeisha Horine/Extender: Genella Rife in Treatment: 9 Vital  Signs Height(in): Pulse(bpm): 98 Weight(lbs): 270 Blood Pressure(mmHg): 126/74 Body Mass Index(BMI): Temperature(F): 98 Respiratory Rate(breaths/min): 17 Photos: [3:Left, Plantar Foot] [N/A:N/A N/A] Wound Location: [3:Gradually Appeared] [N/A:N/A] Wounding Event: [3:Diabetic Wound/Ulcer of the Lower] [N/A:N/A] Primary Etiology: [3:Extremity Anemia, Sleep Apnea, Congestive] [N/A:N/A] Comorbid History: [3:Heart Failure, Hypertension, Peripheral Venous Disease, Cirrhosis , Type II Diabetes, Osteoarthritis, Neuropathy 03/18/2021] [N/A:N/A] Date Acquired: [3:9] [N/A:N/A] Weeks of Treatment: [3:Open] [N/A:N/A] Wound Status: [3:3x3x0.3] [N/A:N/A] Measurements L x W x D (cm) [3:7.069] [N/A:N/A] A (cm) : rea [3:2.121] [N/A:N/A] Volume (cm) : [3:0.00%] [N/A:N/A] % Reduction in A [3:rea: -200.00%] [N/A:N/A] % Reduction in Volume: [3:12] Starting Position  1 (o'clock): [3:12] Ending Position 1 (o'clock): [3:2.7] Maximum Distance 1 (cm): [3:Yes] [N/A:N/A] Undermining: [3:Grade 3] [N/A:N/A] Classification: [3:Large] [N/A:N/A] Exudate A mount: [3:Serosanguineous] [N/A:N/A] Exudate Type: [3:red, brown] [N/A:N/A] Exudate Color: [3:Well defined, not attached] [N/A:N/A] Wound Margin: [3:Large (67-100%)] [N/A:N/A] Granulation A mount: [3:Red] [N/A:N/A] Granulation Quality: [3:Small (1-33%)] [N/A:N/A] Necrotic A mount: [3:Fat Layer (Subcutaneous Tissue): Yes N/A] Exposed Structures: [3:Fascia: No Tendon: No Muscle: No Joint: No Bone: No Small (1-33%)] [N/A:N/A] Epithelialization: [3:Debridement - Excisional] [N/A:N/A] Debridement: Pre-procedure Verification/Time Out 15:55 [N/A:N/A] Taken: [3:Lidocaine] [N/A:N/A] Pain Control: [3:Callus, Subcutaneous, Slough] [N/A:N/A] Tissue Debrided: [3:Skin/Subcutaneous Tissue] [N/A:N/A] Level: [3:9] [N/A:N/A] Debridement A (sq cm): [3:rea Blade, Curette, Forceps] [N/A:N/A] Instrument: [3:Minimum] [N/A:N/A] Bleeding: [3:Pressure] [N/A:N/A] Hemostasis A  chieved: [3:0] [N/A:N/A] Procedural Pain: [3:0] [N/A:N/A] Post Procedural Pain: [3:Procedure was tolerated well] [N/A:N/A] Debridement Treatment Response: [3:3x3x0.3] [N/A:N/A] Post Debridement Measurements L x W x D (cm) [3:2.121] [N/A:N/A] Post Debridement Volume: (cm) [3:Debridement] [N/A:N/A] Treatment Notes Wound #3 (Foot) Wound Laterality: Plantar, Left Cleanser Soap and Water Discharge Instruction: May shower and wash wound with dial antibacterial soap and water prior to dressing change. Wound Cleanser Discharge Instruction: Cleanse the wound with wound cleanser prior to applying a clean dressing using gauze sponges, not tissue or cotton balls. Peri-Wound Care Zinc Oxide Ointment 30g tube Discharge Instruction: Apply Zinc Oxide to periwound with each dressing change Topical Primary Dressing KerraCel Ag Gelling Fiber Dressing, 4x5 in (silver alginate) Discharge Instruction: Apply silver alginate to wound bed as instructed Secondary Dressing ABD Pad, 5x9 Discharge Instruction: Apply over primary dressing as directed. Zetuvit Plus 4x8 in Discharge Instruction: Apply over primary dressing as directed. Secured With [employer], 4.5x3.1 (in/yd) Discharge Instruction: Secure with Kerlix as directed. 90M Medipore H Soft Cloth Surgical T ape, 2x2 (in/yd) Discharge Instruction: Secure dressing with tape as directed. Compression Wrap Compression Stockings Circaid Juxta Lite Compression Wrap Quantity: 1 Left Leg Compression Amount: 30-40 mmHg Discharge Instruction: Apply Circaid Juxta Lite Compression Wrap daily as instructed. Apply first thing in the morning, remove at night before bed. Add-Ons Electronic Signature(s) Signed: 08/12/2021 8:21:05 AM By: Linton Ham MD Signed: 08/14/2021 12:25:45 PM By: Rhae Hammock RN Entered By: Linton Ham on 08/12/2021  08:01:20 -------------------------------------------------------------------------------- [place] Details Patient Name: Date of Service: [name], [name] 08/11/2021 2:45 PM Medical Record Number: [id number] Patient Account Number: [id number] Date of Birth/Sex: Treating RN: May 02, 1961 (60 y.o. [name], Lauren Primary Care Ashlyn Cabler: Billey Gosling Other Clinician: Referring Hanad Leino: Treating Harsh Trulock/Extender: Genella Rife in Treatment: 9 Multidisciplinary Care Plan reviewed with physician Active Inactive HBO Nursing Diagnoses: Anxiety related to feelings of confinement associated with the hyperbaric oxygen chamber Anxiety related to knowledge deficit of hyperbaric oxygen therapy and treatment procedures Discomfort related to temperature and humidity changes inside hyperbaric chamber Potential for barotraumas to ears, sinuses, teeth, and lungs or cerebral gas embolism related to changes in atmospheric pressure inside hyperbaric oxygen chamber Potential for oxygen toxicity seizures related to delivery of 100% oxygen at an increased atmospheric pressure Potential for pulmonary oxygen toxicity related to delivery of 100% oxygen at an increased atmospheric pressure Goals: Barotrauma will be prevented during HBO2 Date Initiated: 06/04/2021 Target Resolution Date: 09/10/2021 Goal Status: Active Patient and/or family will be able to state/discuss factors appropriate to the management of their disease process during treatment Date Initiated: 06/04/2021 Date Inactivated: 08/04/2021 Target Resolution Date: 08/14/2021 Goal Status: Met Patient will tolerate the hyperbaric oxygen therapy treatment Date Initiated: 06/04/2021 Target Resolution Date: 08/14/2021 Goal Status: Active Patient will  tolerate the internal climate of the chamber Date Initiated: 06/04/2021 Target Resolution Date: 08/14/2021 Goal Status: Active Patient/caregiver will verbalize  understanding of HBO goals, rationale, procedures and potential hazards Date Initiated: 06/04/2021 Date Inactivated: 07/28/2021 Target Resolution Date: 08/14/2021 Goal Status: Met Signs and symptoms of pulmonary oxygen toxicity will be recognized and promptly addressed Date Initiated: 06/04/2021 Target Resolution Date: 08/14/2021 Goal Status: Active Signs and symptoms of seizure will be recognized and promptly addressed ; seizing patients will suffer no harm Date Initiated: 06/04/2021 Target Resolution Date: 08/14/2021 Goal Status: Active Interventions: Administer a five (5) minute air break for patient if signs and symptoms of seizure appear and notify the hyperbaric physician Administer decongestants, per physician orders, prior to HBO2 Administer the correct therapeutic gas delivery based on the patients needs and limitations, per physician order Assess and provide for patients comfort related to the hyperbaric environment and equalization of middle ear Assess for signs and symptoms related to adverse events, including but not limited to confinement anxiety, pneumothorax, oxygen toxicity and baurotrauma Assess patient for any history of confinement anxiety Assess patient's knowledge and expectations regarding hyperbaric medicine and provide education related to the hyperbaric environment, goals of treatment and prevention of adverse events Implement protocols to decrease risk of pneumothorax in high risk patients Notes: Nutrition Nursing Diagnoses: Impaired glucose control: actual or potential Potential for alteratiion in Nutrition/Potential for imbalanced nutrition Goals: Patient/caregiver agrees to and verbalizes understanding of need to use nutritional supplements and/or vitamins as prescribed Date Initiated: 06/04/2021 Date Inactivated: 07/07/2021 Target Resolution Date: 07/04/2021 Goal Status: Met Patient/caregiver will maintain therapeutic glucose control Date Initiated:  06/04/2021 Target Resolution Date: 08/14/2021 Goal Status: Active Interventions: Assess HgA1c results as ordered upon admission and as needed Assess patient nutrition upon admission and as needed per policy Provide education on elevated blood sugars and impact on wound healing Notes: 07/07/21: Glucose control ongoing Electronic Signature(s) Signed: 08/11/2021 5:23:14 PM By: Rhae Hammock RN Entered By: Rhae Hammock on 08/11/2021 16:22:12 -------------------------------------------------------------------------------- Pain Assessment Details Patient Name: Date of Service: [name] 08/11/2021 2:45 PM Medical Record Number: [id number] Patient Account Number: [id number] Date of Birth/Sex: Treating RN: 02-Mar-1961 (60 y.o. [name] Primary Care Ethel Veronica: Billey Gosling Other Clinician: Referring Maxi Rodas: Treating Janaiah Vetrano/Extender: Genella Rife in Treatment: 9 Active Problems Location of Pain Severity and Description of Pain Patient Has Paino No Site Locations Pain Management and Medication Current Pain Management: Electronic Signature(s) Signed: 08/11/2021 5:23:14 PM By: Rhae Hammock RN Entered By: Rhae Hammock on 08/11/2021 15:21:04 -------------------------------------------------------------------------------- Patient/Caregiver Education Details Patient Name: Date of Service: [name] 10/25/2022andnbsp2:45 PM Medical Record Number: [id number] Patient Account Number: [id number] Date of Birth/Gender: Treating RN: 24-Dec-1960 (60 y.o. [name] Primary Care Physician: Billey Gosling Other Clinician: Referring Physician: Treating Physician/Extender: Genella Rife in Treatment: 9 Education Assessment Education Provided To: Patient Education Topics Provided Elevated Blood Sugar/ Impact on Healing: Methods: Explain/Verbal Responses: Reinforcements needed, State content  correctly Electronic Signature(s) Signed: 08/11/2021 5:23:14 PM By: Rhae Hammock RN Entered By: Rhae Hammock on 08/11/2021 15:40:00 -------------------------------------------------------------------------------- Wound Assessment Details Patient Name: Date of Service: [name], [name] 08/11/2021 2:45 PM Medical Record Number: [id number] Patient Account Number: [id number] Date of Birth/Sex: Treating RN: September 06, 1961 (60 y.o. [name] Primary Care Davy Faught: Billey Gosling Other Clinician: Referring Micky Overturf: Treating Yola Paradiso/Extender: Genella Rife in Treatment: 9 Wound Status Wound Number: 3 Primary Diabetic Wound/Ulcer of the Lower Extremity Etiology: Wound Location: Left, Plantar Foot Wound Open Wounding Event:  Gradually Appeared Status: Date Acquired: 03/18/2021 Comorbid Anemia, Sleep Apnea, Congestive Heart Failure, Hypertension, Weeks Of Treatment: 9 History: Peripheral Venous Disease, Cirrhosis , Type II Diabetes, Clustered Wound: No Osteoarthritis, Neuropathy Photos Wound Measurements Length: (cm) 3 Width: (cm) 3 Depth: (cm) 0.3 Area: (cm) 7.069 Volume: (cm) 2.121 % Reduction in Area: 0% % Reduction in Volume: -200% Epithelialization: Small (1-33%) Tunneling: No Undermining: Yes Starting Position (o'clock): 12 Ending Position (o'clock): 12 Maximum Distance: (cm) 2.7 Wound Description Classification: Grade 3 Wound Margin: Well defined, not attached Exudate Amount: Large Exudate Type: Serosanguineous Exudate Color: red, brown Foul Odor After Cleansing: No Slough/Fibrino Yes Wound Bed Granulation Amount: Large (67-100%) Exposed Structure Granulation Quality: Red Fascia Exposed: No Necrotic Amount: Small (1-33%) Fat Layer (Subcutaneous Tissue) Exposed: Yes Necrotic Quality: Adherent Slough Tendon Exposed: No Muscle Exposed: No Joint Exposed: No Bone Exposed: No Electronic Signature(s) Signed: 08/11/2021 5:23:14 PM  By: Rhae Hammock RN Signed: 08/11/2021 5:23:46 PM By: [name] RN, BSN Entered By: [name] on 08/11/2021 15:28:22 -------------------------------------------------------------------------------- [place] Details Patient Name: Date of Service: [name] 08/11/2021 2:45 PM Medical Record Number: [id number] Patient Account Number: [id number] Date of Birth/Sex: Treating RN: [birth year] (60 y.o. [name], Lauren Primary Care Marlina Cataldi: Billey Gosling Other Clinician: Referring Izack Hoogland: Treating Aleah Ahlgrim/Extender: Genella Rife in Treatment: 9 Vital Signs Time Taken: 15:14 Temperature (F): 98 Weight (lbs): 270 Pulse (bpm): 98 Respiratory Rate (breaths/min): 17 Blood Pressure (mmHg): 126/74 Reference Range: 80 - 120 mg / dl Electronic Signature(s) Signed: 08/11/2021 5:23:14 PM By: Rhae Hammock RN Entered By: Rhae Hammock on 08/11/2021 15:20:59

## 2021-08-11 NOTE — Progress Notes (Signed)
ROOSVELT, CHURCHWELL ([id number]) Visit Report for 08/07/2021 SuperBill Details Patient Name: Date of Service: [name], [name] 08/07/2021 Medical Record Number: [id number] Patient Account Number: [id number] Date of Birth/Sex: Treating RN: 12-08-1960 (60 y.o. Charlean Merl, Lauren Primary Care Provider: [name] Other Clinician: Haywood Pao Referring Provider: Treating Provider/Extender: [name] in Treatment: 9 Diagnosis Coding ICD-10 Codes Code Description E11.621 Type 2 diabetes mellitus with foot ulcer M14.672 Charcot's joint, left ankle and foot L97.528 Non-pressure chronic ulcer of other part of left foot with other specified severity M86.372 Chronic multifocal osteomyelitis, left ankle and foot Facility Procedures CPT4 Code Description Modifier Quantity [id number] G0277-(Facility Use Only) HBOT full body chamber, , 4 ICD-10 Diagnosis Description E11.621 Type 2 diabetes mellitus with foot ulcer M14.672 Charcot's joint, left ankle and foot Physician Procedures Quantity CPT4 Code Description Modifier [id number] - WC PHYS HYPERBARIC OXYGEN THERAPY 1 ICD-10 Diagnosis Description E11.621 Type 2 diabetes mellitus with foot ulcer M14.672 Charcot's joint, left ankle and foot Electronic Signature(s) Signed: 08/07/2021 1:51:52 PM By: Haywood Pao EMT Signed: 08/10/2021 4:28:21 PM By: [name] DO Entered By: Haywood Pao on 08/07/2021 13:51:51

## 2021-08-12 ENCOUNTER — Encounter (HOSPITAL_BASED_OUTPATIENT_CLINIC_OR_DEPARTMENT_OTHER): Payer: Medicare Other | Admitting: Physician Assistant

## 2021-08-12 ENCOUNTER — Other Ambulatory Visit: Payer: Self-pay

## 2021-08-12 DIAGNOSIS — E11621 Type 2 diabetes mellitus with foot ulcer: Secondary | ICD-10-CM | POA: Diagnosis not present

## 2021-08-12 LAB — GLUCOSE, CAPILLARY
Glucose-Capillary: 152 mg/dL — ABNORMAL HIGH (ref 70–99)
Glucose-Capillary: 157 mg/dL — ABNORMAL HIGH (ref 70–99)

## 2021-08-12 NOTE — Progress Notes (Addendum)
[name], [name] ([id number]) Visit Report for 08/12/2021 Arrival Information Details Patient Name: Date of Service: [name], [name] 08/12/2021 12:30 PM Medical Record Number: [id number] Patient Account Number: [id number] Date of Birth/Sex: Treating RN: 1961-05-10 (60 y.o. [name], [name] Primary Care [name]: [name] Other Clinician: Referring Keland Peyton: Treating Jerry Clyne/Extender: Avie Echevaria in Treatment: 9 Visit Information History Since Last Visit Added or deleted any medications: No Patient Arrived: [name] Any new allergies or adverse reactions: No Arrival Time: 14:07 Had a fall or experienced change in No Accompanied By: self activities of daily living that may affect Transfer Assistance: Manual risk of falls: Patient Identification Verified: Yes Signs or symptoms of abuse/neglect since last visito No Secondary Verification Process Completed: Yes Hospitalized since last visit: No Patient Requires Transmission-Based Precautions: No Implantable device outside of the clinic excluding No Patient Has Alerts: Yes cellular tissue based products placed in the center since last visit: Has Dressing in Place as Prescribed: Yes Pain Present Now: No Electronic Signature(s) Signed: 08/14/2021 12:25:45 PM By: [name] Mu RN Entered By: [name] on 08/12/2021 14:07:49 -------------------------------------------------------------------------------- Clinic Level of Care Assessment Details Patient Name: Date of Service: [name], [name] 08/12/2021 12:30 PM Medical Record Number: [id number] Patient Account Number: [id number] Date of Birth/Sex: Treating RN: May 01, 1961 (60 y.o. [name], [name] Primary Care Izaia Say: [name] Other Clinician: Referring Blessin Kanno: Treating Siham Bucaro/Extender: Avie Echevaria in Treatment: 9 Clinic Level of Care Assessment Items TOOL 4 Quantity Score X- 1 0 Use when only an EandM is  performed on FOLLOW-UP visit ASSESSMENTS - Nursing Assessment / Reassessment X- 1 10 Reassessment of Co-morbidities (includes updates in patient status) X- 1 5 Reassessment of Adherence to Treatment Plan ASSESSMENTS - Wound and Skin A ssessment / Reassessment X - Simple Wound Assessment / Reassessment - one wound 1 5 []  - 0 Complex Wound Assessment / Reassessment - multiple wounds []  - 0 Dermatologic / Skin Assessment (not related to wound area) ASSESSMENTS - Focused Assessment X- 1 5 Circumferential Edema Measurements - multi extremities []  - 0 Nutritional Assessment / Counseling / Intervention []  - 0 Lower Extremity Assessment (monofilament, tuning fork, pulses) []  - 0 Peripheral Arterial Disease Assessment (using hand held doppler) ASSESSMENTS - Ostomy and/or Continence Assessment and Care []  - 0 Incontinence Assessment and Management []  - 0 Ostomy Care Assessment and Management (repouching, etc.) PROCESS - Coordination of Care X - Simple Patient / Family Education for ongoing care 1 15 []  - 0 Complex (extensive) Patient / Family Education for ongoing care X- 1 10 Staff obtains , Records, T Results / Process Orders est []  - 0 Staff telephones HHA, Nursing Homes / Clarify orders / etc []  - 0 Routine Transfer to another Facility (non-emergent condition) []  - 0 Routine Hospital Admission (non-emergent condition) []  - 0 New Admissions / / Ordering NPWT Apligraf, etc. , []  - 0 Emergency Hospital Admission (emergent condition) X- 1 10 Simple Discharge Coordination []  - 0 Complex (extensive) Discharge Coordination PROCESS - Special Needs []  - 0 Pediatric / Minor Patient Management []  - 0 Isolation Patient Management []  - 0 Hearing / Language / Visual special needs []  - 0 Assessment of Community assistance (transportation, D/C planning, etc.) []  - 0 Additional assistance / Altered mentation []  - 0 Support Surface(s) Assessment  (bed, cushion, seat, etc.) INTERVENTIONS - Wound Cleansing / Measurement X - Simple Wound Cleansing - one wound 1 5 []  - 0 Complex Wound Cleansing - multiple wounds X- 1 5 Wound  Imaging (photographs - any number of wounds) []  - 0 Wound Tracing (instead of photographs) X- 1 5 Simple Wound Measurement - one wound []  - 0 Complex Wound Measurement - multiple wounds INTERVENTIONS - Wound Dressings X - Small Wound Dressing one or multiple wounds 1 10 []  - 0 Medium Wound Dressing one or multiple wounds []  - 0 Large Wound Dressing one or multiple wounds []  - 0 Application of Medications - topical []  - 0 Application of Medications - injection INTERVENTIONS - Miscellaneous []  - 0 External ear exam []  - 0 Specimen Collection (cultures, biopsies, blood, body fluids, etc.) []  - 0 Specimen(s) / Culture(s) sent or taken to Lab for analysis []  - 0 Patient Transfer (multiple staff / / Similar devices) []  - 0 Simple Staple / Suture removal (25 or less) []  - 0 Complex Staple / Suture removal (26 or more) []  - 0 Hypo / Hyperglycemic Management (close monitor of Blood Glucose) []  - 0 Ankle / Brachial Index (ABI) - do not check if billed separately X- 1 5 Vital Signs Has the patient been seen at the hospital within the last three years: Yes Total Score: 90 Level Of Care: New/Established - Level 3 Electronic Signature(s) Signed: 08/14/2021 12:25:45 PM By: RN Entered By: on 08/12/2021 14:23:03 -------------------------------------------------------------------------------- Encounter Discharge Information Details Patient Name: Date of Service: IG 08/12/2021 12:30 PM Medical Record Number: Patient Account Number: Date of Birth/Sex: Treating RN: [birth year] (60 y.o. Nurse, adult Primary Care Evie Croston: Other Clinician: Referring Fuad Forget: Treating Jayvan Mcshan/Extender: in Treatment: 9 Encounter Discharge Information Items Discharge Condition: Stable Ambulatory Status: Walker Discharge Destination: Home Transportation: Private Auto Accompanied By: wife Schedule Follow-up Appointment: Yes Clinical Summary of Care: Patient Declined Electronic Signature(s) Signed: 08/14/2021 12:25:45 PM By: RN Entered By: 08/16/2021 on 08/12/2021 14:22:14 -------------------------------------------------------------------------------- Patient/Caregiver Education Details Patient Name: Date of Service: [name] 10/26/2022andnbsp12:30 PM Medical Record Number: [name] Patient Account Number: 08/14/2021 Date of Birth/Gender: Treating RN: 08-Apr-1961 (60 y.o. [id number] Primary Care Physician: 09/19/1961 Other Clinician: Referring Physician: Treating Physician/Extender: [name] in Treatment: 9 Education Assessment Education Provided To: Patient Education Topics Provided Elevated Blood Sugar/ Impact on Healing: Methods: Explain/Verbal Responses: Reinforcements needed, State content correctly Wound/Skin Impairment: Methods: Explain/Verbal Responses: Reinforcements needed, State content correctly Electronic Signature(s) Signed: 08/14/2021 12:25:45 PM By: Avie Echevaria RN Entered By: 08/16/2021 on 08/12/2021 14:21:54 -------------------------------------------------------------------------------- Wound Assessment Details Patient Name: Date of Service: [name] IG 08/12/2021 12:30 PM Medical Record Number: Benjaman Kindler Patient Account Number: 08/14/2021 Date of Birth/Sex: Treating RN: 1961-02-21 (60 y.o. [id number], [name] Primary Care Hisae Decoursey: 09/19/1961 Other Clinician: Referring Agape Hardiman: Treating Shad Ledvina/Extender: [name] in Treatment: 9 Wound Status Wound Number: 3 Primary Diabetic Wound/Ulcer of the Lower  Extremity Etiology: Wound Location: Left, Plantar Foot Wound Open Wounding Event: Gradually Appeared Status: Date Acquired: 03/18/2021 Comorbid Anemia, Sleep Apnea, Congestive Heart Failure, Hypertension, Weeks Of Treatment: 9 History: Peripheral Venous Disease, Cirrhosis , Type II Diabetes, Clustered Wound: No Osteoarthritis, Neuropathy Wound Measurements Length: (cm) 3 Width: (cm) 3 Depth: (cm) 0.3 Area: (cm) 7.069 Volume: (cm) 2.121 % Reduction in Area: 0% % Reduction in Volume: -200% Epithelialization: Small (1-33%) Tunneling: No Undermining: No Wound Description Classification: Grade 3 Wound Margin: Well defined, not attached Exudate Amount: Large Exudate Type: Serosanguineous Exudate Color: red, brown Foul Odor After Cleansing: No Slough/Fibrino Yes Wound Bed  Granulation Amount: Large (67-100%) Exposed Structure Granulation Quality: Red Fascia Exposed: No Necrotic Amount: Small (1-33%) Fat Layer (Subcutaneous Tissue) Exposed: Yes Necrotic Quality: Adherent Slough Tendon Exposed: No Muscle Exposed: No Joint Exposed: No Bone Exposed: No Treatment Notes Wound #3 (Foot) Wound Laterality: Plantar, Left Cleanser Soap and Water Discharge Instruction: May shower and wash wound with dial antibacterial soap and water prior to dressing change. Wound Cleanser Discharge Instruction: Cleanse the wound with wound cleanser prior to applying a clean dressing using gauze sponges, not tissue or cotton balls. Peri-Wound Care Zinc Oxide Ointment 30g tube Discharge Instruction: Apply Zinc Oxide to periwound with each dressing change Topical Primary Dressing KerraCel Ag Gelling Fiber Dressing, 4x5 in (silver alginate) Discharge Instruction: Apply silver alginate to wound bed as instructed Secondary Dressing ABD Pad, 5x9 Discharge Instruction: Apply over primary dressing as directed. Zetuvit Plus 4x8 in Discharge Instruction: Apply over primary dressing as directed. Secured  With [employer], 4.5x3.1 (in/yd) Discharge Instruction: Secure with Kerlix as directed. 77M Medipore H Soft Cloth Surgical T ape, 2x2 (in/yd) Discharge Instruction: Secure dressing with tape as directed. Compression Wrap Compression Stockings Circaid Juxta Lite Compression Wrap Quantity: 1 Left Leg Compression Amount: 30-40 mmHg Discharge Instruction: Apply Circaid Juxta Lite Compression Wrap daily as instructed. Apply first thing in the morning, remove at night before bed. Add-Ons Electronic Signature(s) Signed: 08/14/2021 12:25:45 PM By: [name] Mu RN Entered By: [name] on 08/12/2021 14:10:48 -------------------------------------------------------------------------------- Vitals Details Patient Name: Date of Service: KENNY, REA IG 08/12/2021 12:30 PM Medical Record Number: [id number] Patient Account Number: [id number] Date of Birth/Sex: Treating RN: 27-Sep-1961 (60 y.o. [name] Primary Care Anetria Harwick: [name] Other Clinician: Referring Honi Name: Treating Avonda Toso/Extender: Avie Echevaria in Treatment: 9 Vital Signs Time Taken: 13:35 Temperature (F): 97.5 Weight (lbs): 270 Pulse (bpm): 105 Respiratory Rate (breaths/min): 18 Blood Pressure (mmHg): 134/67 Capillary Blood Glucose (mg/dl): 629 Reference Range: 80 - 120 mg / dl Electronic Signature(s) Signed: 08/12/2021 1:40:42 PM By: Haywood Pao CHT EMT BS , , Entered By: Haywood Pao on 08/12/2021 13:40:41

## 2021-08-12 NOTE — Progress Notes (Signed)
UZZIEL, RUSSEY ([id number]) Visit Report for 08/12/2021 Problem List Details Patient Name: Date of Service: DYLYN, [facility] 08/12/2021 10:00 A M Medical Record Number: [id number] Patient Account Number: [id number] Date of Birth/Sex: Treating RN: [birth year] (60 y.o. [name] Primary Care Provider: [name] Other Clinician: Haywood Pao Referring Provider: Treating Provider/Extender: [name] in Treatment: 9 Active Problems ICD-10 Encounter Code Description Active Date MDM Diagnosis E11.621 Type 2 diabetes mellitus with foot ulcer 06/04/2021 No Yes M14.672 Charcot's joint, left ankle and foot 06/04/2021 No Yes L97.528 Non-pressure chronic ulcer of other part of left foot with other specified 06/04/2021 No Yes severity M86.372 Chronic multifocal osteomyelitis, left ankle and foot 06/04/2021 No Yes Inactive Problems Resolved Problems Electronic Signature(s) Signed: 08/12/2021 4:44:20 PM By: Lenda Kelp PA-C Entered By: Lenda Kelp on 08/12/2021 16:44:20 -------------------------------------------------------------------------------- SuperBill Details Patient Name: Date of Service: [name] 08/12/2021 Medical Record Number: [id number] Patient Account Number: [id number] Date of Birth/Sex: Treating RN: 08-13-1961 (60 y.o. [name] Primary Care Provider: [name] Other Clinician: Haywood Pao Referring Provider: Treating Provider/Extender: [name] in Treatment: 9 Diagnosis Coding ICD-10 Codes Code Description E11.621 Type 2 diabetes mellitus with foot ulcer M14.672 Charcot's joint, left ankle and foot L97.528 Non-pressure chronic ulcer of other part of left foot with other specified severity M86.372 Chronic multifocal osteomyelitis, left ankle and foot Facility Procedures CPT4 Code: [id number] Description: G0277-(Facility Use Only) HBOT full body chamber, , ICD-10 Diagnosis  Description E11.621 Type 2 diabetes mellitus with foot ulcer M14.672 Charcot's joint, left ankle and foot L97.528 Non-pressure chronic ulcer of other part of left  foot with other specified severity M86.372 Chronic multifocal osteomyelitis, left ankle and foot Modifier: Quantity: 5 Physician Procedures : CPT4 Code Description Modifier [id number] - WC PHYS HYPERBARIC OXYGEN THERAPY ICD-10 Diagnosis Description E11.621 Type 2 diabetes mellitus with foot ulcer M14.672 Charcot's joint, left ankle and foot L97.528 Non-pressure chronic ulcer of other part  of left foot with other specified severity M86.372 Chronic multifocal osteomyelitis, left ankle and foot Quantity: 1 Electronic Signature(s) Signed: 08/12/2021 4:44:16 PM By: Lenda Kelp PA-C Previous Signature: 08/12/2021 2:23:02 PM Version By: Haywood Pao CHT EMT BS , , Entered By: Lenda Kelp on 08/12/2021 16:44:16

## 2021-08-12 NOTE — Progress Notes (Addendum)
[name], [name] ([id number]) Visit Report for 08/12/2021 HBO Details Patient Name: Date of Service: [name], [name] 08/12/2021 10:00 A M Medical Record Number: [id number] Patient Account Number: [id number] Date of Birth/Sex: Treating RN: [birth year] (60 y.o. [name] Primary Care Oralia Criger: [name] Other Clinician: Haywood Pao Referring Azad Calame: Treating Zanaria Morell/Extender: [name] in Treatment: 9 HBO Treatment Course Details Treatment Course Number: 1 Ordering Elgene Coral: Baltazar Najjar T Treatments Ordered: otal 40 HBO Treatment Start Date: 07/07/2021 HBO Indication: Chronic Refractory Osteomyelitis to Left,Plantar Foot HBO Treatment Details Treatment Number: 20 Patient Type: Outpatient Chamber Type: Monoplace Chamber Serial #: [id number] Treatment Protocol: 2.5 ATA with 90 minutes oxygen, with two 5 minute air breaks Treatment Details Compression Rate Down: 2.0 psi / minute De-Compression Rate Up: 2.0 psi / minute A breaks and breathing ir Compress Tx Pressure periods Decompress Decompress Begins Reached (leave unused spaces Begins Ends blank) Chamber Pressure (ATA 1 2.5 2.5 2.5 2.5 2.5 - - 2.5 1 ) Clock Time (24 hr) 10:44 11:10 11:40 11:45 12:15 12:20 - - 12:50 13:05 Treatment Length: 141 (minutes) Treatment Segments: 5 Vital Signs Capillary Blood Glucose Reference Range: 80 - 120 mg / dl HBO Diabetic Blood Glucose Intervention Range: <131 mg/dl or >130 mg/dl Time Vitals Blood Respiratory Capillary Blood Glucose Pulse Action Type: Pulse: Temperature: Taken: Pressure: Rate: Glucose (mg/dl): Meter #: Oximetry (%) Taken: Pre 10:13 145/65 102 18 97.5 152 Post 13:27 134/67 105 18 97.6 157 Treatment Response Treatment Toleration: Well Treatment Completion Status: Treatment Completed without Adverse Event Physician HBO Attestation: I certify that I supervised this HBO treatment in accordance with Medicare guidelines. A trained  emergency response team is readily available per Yes hospital policies and procedures. Continue HBOT as ordered. Yes Electronic Signature(s) Signed: 08/12/2021 4:44:12 PM By: Lenda Kelp PA-C Previous Signature: 08/12/2021 2:22:34 PM Version By: Haywood Pao CHT EMT BS , , Entered By: Lenda Kelp on 08/12/2021 16:44:11 -------------------------------------------------------------------------------- HBO Safety Checklist Details Patient Name: Date of Service: [name], [name] 08/12/2021 10:00 A M Medical Record Number: [id number] Patient Account Number: [id number] Date of Birth/Sex: Treating RN: November 27, 1960 (60 y.o. [name] Primary Care Donnetta Gillin: [name] Other Clinician: Haywood Pao Referring Kaiven Vester: Treating Oryon Gary/Extender: [name] in Treatment: 9 HBO Safety Checklist Items Safety Checklist Consent Form Signed Patient voided / foley secured and emptied When did you last eato snack this AM Last dose of injectable or oral agent n/a Ostomy pouch emptied and vented if applicable NA All implantable devices assessed, documented and approved NA Intravenous access site secured and place NA Valuables secured Linens and cotton and cotton/polyester blend (less than 51% polyester) Personal oil-based products / skin lotions / body lotions removed Wigs or hairpieces removed NA Smoking or tobacco materials removed NA Books / newspapers / magazines / loose paper removed Cologne, aftershave, perfume and deodorant removed Jewelry removed (may wrap wedding band) NA Make-up removed NA Hair care products removed Hair Elastic Battery operated devices (external) removed Heating patches and chemical warmers removed Titanium eyewear removed NA Nail polish cured greater than 10 hours NA Casting material cured greater than 10 hours NA Hearing aids removed NA Loose dentures or partials removed NA Prosthetics have been  removed NA Patient demonstrates correct use of air break device (if applicable) Patient concerns have been addressed Patient grounding bracelet on and cord attached to chamber Specifics for Inpatients (complete in addition to above) Medication sheet sent with patient NA Intravenous medications needed or  due during therapy sent with patient NA Drainage tubes (e.g. nasogastric tube or chest tube secured and vented) NA Endotracheal or Tracheotomy tube secured NA Cuff deflated of air and inflated with saline NA Airway suctioned NA Notes Paper version used prior to treatment Electronic Signature(s) Signed: 08/12/2021 1:00:45 PM By: Haywood Pao CHT EMT BS , , Entered By: Haywood Pao on 08/12/2021 13:00:44

## 2021-08-12 NOTE — Progress Notes (Addendum)
ARSHDEEP, BOLGER ([id number]) Visit Report for 08/12/2021 Arrival Information Details Patient Name: Date of Service: [name], [name] 08/12/2021 10:00 A M Medical Record Number: [id number] Patient Account Number: [id number] Date of Birth/Sex: Treating RN: 01-18-1961 (60 y.o. [name] Primary Care Anaaya Fuster: [name] Other Clinician: Haywood Pao Referring Sidrah Harden: Treating Mckaylie Vasey/Extender: [name] in Treatment: 9 Visit Information History Since Last Visit All ordered tests and consults were completed: Yes Patient Arrived: [name] Added or deleted any medications: No Arrival Time: 10:10 Any new allergies or adverse reactions: No Accompanied By: self Had a fall or experienced change in No Transfer Assistance: Manual activities of daily living that may affect Patient Identification Verified: Yes risk of falls: Secondary Verification Process Completed: Yes Signs or symptoms of abuse/neglect since last visito No Patient Requires Transmission-Based Precautions: No Hospitalized since last visit: No Patient Has Alerts: Yes Implantable device outside of the clinic excluding No cellular tissue based products placed in the center since last visit: Pain Present Now: No Electronic Signature(s) Signed: 08/12/2021 12:58:11 PM By: Haywood Pao CHT EMT BS , , Entered By: Haywood Pao on 08/12/2021 12:58:11 -------------------------------------------------------------------------------- Encounter Discharge Information Details Patient Name: Date of Service: [name] 08/12/2021 10:00 A M Medical Record Number: [id number] Patient Account Number: [id number] Date of Birth/Sex: Treating RN: 09/24/1961 (60 y.o. [name] Primary Care Dariusz Brase: [name] Other Clinician: Haywood Pao Referring Setareh Rom: Treating Cashel Bellina/Extender: [name] in Treatment: 9 Encounter Discharge Information  Items Discharge Condition: Stable Ambulatory Status: Walker Discharge Destination: Other (Note Required) Transportation: Other Accompanied By: staff Schedule Follow-up Appointment: No Clinical Summary of Care: Notes Patient has Wound Care Encounter after treatment. Electronic Signature(s) Signed: 08/12/2021 2:23:49 PM By: Haywood Pao CHT EMT BS , , Entered By: Haywood Pao on 08/12/2021 14:23:49 -------------------------------------------------------------------------------- Vitals Details Patient Name: Date of Service: [name] 08/12/2021 10:00 A M Medical Record Number: [id number] Patient Account Number: [id number] Date of Birth/Sex: Treating RN: 03-19-1961 (60 y.o. [name] Primary Care Dim Meisinger: [name] Other Clinician: Haywood Pao Referring [place] Hedman: Treating Coral Soler/Extender: [name] in Treatment: 9 Vital Signs Time Taken: 10:13 Temperature (F): 97.5 Weight (lbs): 270 Pulse (bpm): 102 Respiratory Rate (breaths/min): 18 Blood Pressure (mmHg): 145/65 Capillary Blood Glucose (mg/dl): 277 Reference Range: 80 - 120 mg / dl Electronic Signature(s) Signed: 08/12/2021 12:58:50 PM By: Haywood Pao CHT EMT BS , , Entered By: Haywood Pao on 08/12/2021 12:58:50

## 2021-08-13 ENCOUNTER — Encounter (HOSPITAL_BASED_OUTPATIENT_CLINIC_OR_DEPARTMENT_OTHER): Payer: Medicare Other | Admitting: Internal Medicine

## 2021-08-13 DIAGNOSIS — E11621 Type 2 diabetes mellitus with foot ulcer: Secondary | ICD-10-CM | POA: Diagnosis not present

## 2021-08-13 LAB — GLUCOSE, CAPILLARY
Glucose-Capillary: 182 mg/dL — ABNORMAL HIGH (ref 70–99)
Glucose-Capillary: 131 mg/dL — ABNORMAL HIGH (ref 70–99)

## 2021-08-13 NOTE — Progress Notes (Addendum)
[name], [name] ([id number]) Visit Report for 08/13/2021 HBO Details Patient Name: Date of Service: [name], [name] 08/13/2021 10:00 A M Medical Record Number: [id number] Patient Account Number: [id number] Date of Birth/Sex: Treating RN: [birth year] (60 y.o. [name] Primary Care Kylie Simmonds: [name] Other Clinician: Haywood Pao Referring Osias Resnick: Treating Estoria Geary/Extender: Avie Echevaria in Treatment: 10 HBO Treatment Course Details Treatment Course Number: 1 Ordering Isaiahs Chancy: Baltazar Najjar T Treatments Ordered: otal 40 HBO Treatment Start Date: 07/07/2021 HBO Indication: Chronic Refractory Osteomyelitis to Left,Plantar Foot HBO Treatment Details Treatment Number: 21 Patient Type: Outpatient Chamber Type: Monoplace Chamber Serial #: [id number] Treatment Protocol: 2.5 ATA with 90 minutes oxygen, with two 5 minute air breaks Treatment Details Compression Rate Down: 2.0 psi / minute De-Compression Rate Up: 2.0 psi / minute A breaks and breathing ir Compress Tx Pressure periods Decompress Decompress Begins Reached (leave unused spaces Begins Ends blank) Chamber Pressure (ATA 1 2.5 2.5 2.5 2.5 2.5 - - 2.5 1 ) Clock Time (24 hr) 10:42 10:53 11:23 11:28 11:58 12:03 - - 12:33 12:44 Treatment Length: 122 (minutes) Treatment Segments: 4 Vital Signs Capillary Blood Glucose Reference Range: 80 - 120 mg / dl HBO Diabetic Blood Glucose Intervention Range: <131 mg/dl or >235 mg/dl Time Vitals Blood Respiratory Capillary Blood Glucose Pulse Action Type: Pulse: Temperature: Taken: Pressure: Rate: Glucose (mg/dl): Meter #: Oximetry (%) Taken: Pre 10:38 131/95 108 18 97.6 182 Post 12:59 134/69 94 18 97.3 131 Treatment Response Treatment Toleration: Well Treatment Completion Status: Treatment Completed without Adverse Event Brenae Lasecki Notes No concerns with treatment given Physician HBO Attestation: I certify that I supervised this HBO treatment in  accordance with Medicare guidelines. A trained emergency response team is readily available per Yes hospital policies and procedures. Continue HBOT as ordered. Yes Electronic Signature(s) Signed: 08/18/2021 4:29:54 PM By: Baltazar Najjar MD Previous Signature: 08/13/2021 1:49:23 PM Version By: Haywood Pao CHT EMT BS , , Entered By: Baltazar Najjar on 08/13/2021 16:43:45 -------------------------------------------------------------------------------- HBO Safety Checklist Details Patient Name: Date of Service: [name] 08/13/2021 10:00 A M Medical Record Number: [id number] Patient Account Number: [id number] Date of Birth/Sex: Treating RN: 11/04/1960 (60 y.o. [name], [name] Primary Care Nalin Mazzocco: [name] Other Clinician: Haywood Pao Referring Meital Riehl: Treating Javiel Canepa/Extender: Avie Echevaria in Treatment: 10 HBO Safety Checklist Items Safety Checklist Consent Form Signed Patient voided / foley secured and emptied When did you last eato snack this AM Last dose of injectable or oral agent n/a Ostomy pouch emptied and vented if applicable NA All implantable devices assessed, documented and approved NA Intravenous access site secured and place NA Valuables secured Linens and cotton and cotton/polyester blend (less than 51% polyester) Personal oil-based products / skin lotions / body lotions removed Wigs or hairpieces removed NA Smoking or tobacco materials removed NA Books / newspapers / magazines / loose paper removed Cologne, aftershave, perfume and deodorant removed Jewelry removed (may wrap wedding band) Make-up removed Hair care products removed Hair Elastic Battery operated devices (external) removed Heating patches and chemical warmers removed Titanium eyewear removed NA Nail polish cured greater than 10 hours NA Casting material cured greater than 10 hours NA Hearing aids removed NA Loose dentures or partials  removed NA Prosthetics have been removed NA Patient demonstrates correct use of air break device (if applicable) Patient concerns have been addressed Patient grounding bracelet on and cord attached to chamber Specifics for Inpatients (complete in addition to above) Medication sheet sent with patient NA Intravenous medications needed  or due during therapy sent with patient NA Drainage tubes (e.g. nasogastric tube or chest tube secured and vented) NA Endotracheal or Tracheotomy tube secured NA Cuff deflated of air and inflated with saline NA Airway suctioned NA Notes Paper version used prior to treatment. Electronic Signature(s) Signed: 08/13/2021 1:59:52 PM By: Haywood Pao CHT EMT BS , , Previous Signature: 08/13/2021 1:47:22 PM Version By: Haywood Pao CHT EMT BS , , Entered By: Haywood Pao on 08/13/2021 13:59:51

## 2021-08-14 ENCOUNTER — Other Ambulatory Visit: Payer: Self-pay

## 2021-08-14 ENCOUNTER — Encounter (HOSPITAL_BASED_OUTPATIENT_CLINIC_OR_DEPARTMENT_OTHER): Payer: Medicare Other | Admitting: Internal Medicine

## 2021-08-14 DIAGNOSIS — E11621 Type 2 diabetes mellitus with foot ulcer: Secondary | ICD-10-CM | POA: Diagnosis not present

## 2021-08-14 DIAGNOSIS — L97528 Non-pressure chronic ulcer of other part of left foot with other specified severity: Secondary | ICD-10-CM | POA: Diagnosis not present

## 2021-08-14 DIAGNOSIS — M14672 Charcot's joint, left ankle and foot: Secondary | ICD-10-CM

## 2021-08-14 LAB — GLUCOSE, CAPILLARY
Glucose-Capillary: 162 mg/dL — ABNORMAL HIGH (ref 70–99)
Glucose-Capillary: 155 mg/dL — ABNORMAL HIGH (ref 70–99)

## 2021-08-14 NOTE — Progress Notes (Signed)
TORRE, SCHAUMBURG ([id number]) Visit Report for 08/14/2021 SuperBill Details Patient Name: Date of Service: [name], [name] 08/14/2021 Medical Record Number: [id number] Patient Account Number: [id number] Date of Birth/Sex: Treating RN: Apr 29, 1961 (60 y.o. [name] Primary Care Provider: [name] Other Clinician: Haywood Pao Referring Provider: Treating Provider/Extender: Morley Kos Weeks in Treatment: 10 Diagnosis Coding ICD-10 Codes Code Description E11.621 Type 2 diabetes mellitus with foot ulcer M14.672 Charcot's joint, left ankle and foot L97.528 Non-pressure chronic ulcer of other part of left foot with other specified severity M86.372 Chronic multifocal osteomyelitis, left ankle and foot Facility Procedures CPT4 Code Description Modifier Quantity [id number]-(Facility Use Only) HBOT full body chamber, , 4 ICD-10 Diagnosis Description E11.621 Type 2 diabetes mellitus with foot ulcer M14.672 Charcot's joint, left ankle and foot L97.528 Non-pressure chronic ulcer of other part of left foot with other specified severity Physician Procedures Quantity CPT4 Code Description Modifier [id number] - WC PHYS HYPERBARIC OXYGEN THERAPY 1 ICD-10 Diagnosis Description E11.621 Type 2 diabetes mellitus with foot ulcer M14.672 Charcot's joint, left ankle and foot L97.528 Non-pressure chronic ulcer of other part of left foot with other specified severity Electronic Signature(s) Signed: 08/14/2021 1:52:28 PM By: Haywood Pao CHT EMT BS , , Signed: 08/14/2021 3:09:19 PM By: [name] DO Entered By: Haywood Pao on 08/14/2021 13:52:28

## 2021-08-14 NOTE — Progress Notes (Addendum)
ELIGH, RYBACKI ([id number]) Visit Report for 08/14/2021 HBO Details Patient Name: Date of Service: [name], [name] 08/14/2021 10:00 A M Medical Record Number: [id number] Patient Account Number: [id number] Date of Birth/Sex: Treating RN: 01/31/1961 (60 y.o. [name] Primary Care Na Waldrip: [name] Other Clinician: Haywood Pao Referring Margi Edmundson: Treating Geraldin Habermehl/Extender: [name] in Treatment: 10 HBO Treatment Course Details Treatment Course Number: 1 Ordering Ellard Nan: Baltazar Najjar T Treatments Ordered: otal 40 HBO Treatment Start Date: 07/07/2021 HBO Indication: Chronic Refractory Osteomyelitis to Left,Plantar Foot HBO Treatment Details Treatment Number: 22 Patient Type: Outpatient Chamber Type: Monoplace Chamber Serial #: [id number] Treatment Protocol: 2.5 ATA with 90 minutes oxygen, with two 5 minute air breaks Treatment Details Compression Rate Down: 2.0 psi / minute De-Compression Rate Up: 2.0 psi / minute A breaks and breathing ir Compress Tx Pressure periods Decompress Decompress Begins Reached (leave unused spaces Begins Ends blank) Chamber Pressure (ATA 1 2.5 2.5 2.5 2.5 2.5 - - 2.5 1 ) Clock Time (24 hr) 10:29 10:41 11:11 11:16 11:46 11:51 - - 12:21 12:33 Treatment Length: 124 (minutes) Treatment Segments: 4 Vital Signs Capillary Blood Glucose Reference Range: 80 - 120 mg / dl HBO Diabetic Blood Glucose Intervention Range: <131 mg/dl or >400 mg/dl Time Vitals Blood Respiratory Capillary Blood Glucose Pulse Action Type: Pulse: Temperature: Taken: Pressure: Rate: Glucose (mg/dl): Meter #: Oximetry (%) Taken: Pre 10:02 142/71 91 20 98.1 162 Post 12:34 140/88 101 18 98.1 155 Treatment Response Treatment Toleration: Well Treatment Completion Status: Treatment Completed without Adverse Event Physician HBO Attestation: I certify that I supervised this HBO treatment in accordance with Medicare guidelines. A trained  emergency response team is readily available per Yes hospital policies and procedures. Continue HBOT as ordered. Yes Electronic Signature(s) Signed: 08/14/2021 3:09:19 PM By: [name] DO Previous Signature: 08/14/2021 1:52:06 PM Version By: Haywood Pao CHT EMT BS , , Entered By: [name] on 08/14/2021 15:08:47 -------------------------------------------------------------------------------- HBO Safety Checklist Details Patient Name: Date of Service: [name], [name] IG 08/14/2021 10:00 A M Medical Record Number: [id number] Patient Account Number: [id number] Date of Birth/Sex: Treating RN: 1961-01-06 (60 y.o. [name] Primary Care Jun Rightmyer: [name] Other Clinician: Haywood Pao Referring Shawon Denzer: Treating Goldie Tregoning/Extender: Morley Kos Weeks in Treatment: 10 HBO Safety Checklist Items Safety Checklist Consent Form Signed Patient voided / foley secured and emptied When did you last eato snack this AM Last dose of injectable or oral agent n/a Ostomy pouch emptied and vented if applicable NA All implantable devices assessed, documented and approved NA Intravenous access site secured and place NA Valuables secured Linens and cotton and cotton/polyester blend (less than 51% polyester) Personal oil-based products / skin lotions / body lotions removed Wigs or hairpieces removed NA Smoking or tobacco materials removed NA Books / newspapers / magazines / loose paper removed Cologne, aftershave, perfume and deodorant removed Jewelry removed (may wrap wedding band) Make-up removed NA Hair care products removed Hair Elastic Battery operated devices (external) removed Heating patches and chemical warmers removed Titanium eyewear removed NA Nail polish cured greater than 10 hours NA Casting material cured greater than 10 hours NA Hearing aids removed NA Loose dentures or partials removed NA Prosthetics have been  removed NA Patient demonstrates correct use of air break device (if applicable) Patient concerns have been addressed Patient grounding bracelet on and cord attached to chamber Specifics for Inpatients (complete in addition to above) Medication sheet sent with patient NA Intravenous medications needed or due during therapy sent with  patient NA Drainage tubes (e.g. nasogastric tube or chest tube secured and vented) NA Endotracheal or Tracheotomy tube secured NA Cuff deflated of air and inflated with saline NA Airway suctioned NA Electronic Signature(s) Signed: 08/14/2021 1:50:20 PM By: Haywood Pao CHT EMT BS , , Entered By: Haywood Pao on 08/14/2021 13:50:20

## 2021-08-14 NOTE — Progress Notes (Addendum)
[name] ([id number]) Visit Report for 08/14/2021 Arrival Information Details Patient Name: Date of Service: [name] 08/14/2021 10:00 A M Medical Record Number: [id number] Patient Account Number: [id number] Date of Birth/Sex: Treating RN: Mar 01, 1961 (60 y.o. [name] Primary Care Athony Coppa: [name] Other Clinician: Haywood Pao Referring Vicente Weidler: Treating Jatniel Verastegui/Extender: [name] in Treatment: 10 Visit Information History Since Last Visit All ordered tests and consults were completed: Yes Patient Arrived: [name] Added or deleted any medications: No Arrival Time: 09:58 Any new allergies or adverse reactions: No Accompanied By: self Had a fall or experienced change in No Transfer Assistance: None activities of daily living that may affect Patient Identification Verified: Yes risk of falls: Secondary Verification Process Completed: Yes Signs or symptoms of abuse/neglect since last visito No Patient Requires Transmission-Based Precautions: No Hospitalized since last visit: No Patient Has Alerts: Yes Implantable device outside of the clinic excluding No cellular tissue based products placed in the center since last visit: Pain Present Now: No Electronic Signature(s) Signed: 08/14/2021 11:23:50 AM By: Haywood Pao CHT EMT BS , , Entered By: Haywood Pao on 08/14/2021 11:23:50 -------------------------------------------------------------------------------- Encounter Discharge Information Details Patient Name: Date of Service: [name] 08/14/2021 10:00 A M Medical Record Number: [id number] Patient Account Number: [id number] Date of Birth/Sex: Treating RN: [birth year] (60 y.o. [name] Primary Care Kately Graffam: [name] Other Clinician: Haywood Pao Referring Sandralee Tarkington: Treating Legaci Tarman/Extender: [name] in Treatment: 10 Encounter Discharge Information  Items Discharge Condition: Stable Ambulatory Status: Walker Discharge Destination: Home Transportation: Private Auto Accompanied By: girlfriend Schedule Follow-up Appointment: No Clinical Summary of Care: Electronic Signature(s) Signed: 08/14/2021 1:53:08 PM By: Haywood Pao CHT EMT BS , , Entered By: Haywood Pao on 08/14/2021 13:53:08 -------------------------------------------------------------------------------- Vitals Details Patient Name: Date of Service: [name] 08/14/2021 10:00 A M Medical Record Number: [id number] Patient Account Number: [id number] Date of Birth/Sex: Treating RN: 1960-12-28 (60 y.o. [name] Primary Care Thelia Tanksley: [name] Other Clinician: Haywood Pao Referring Jeramine Delis: Treating Noami Bove/Extender: [name] in Treatment: 10 Vital Signs Time Taken: 10:02 Temperature (F): 98.1 Weight (lbs): 270 Pulse (bpm): 91 Respiratory Rate (breaths/min): 20 Blood Pressure (mmHg): 142/71 Capillary Blood Glucose (mg/dl): 638 Reference Range: 80 - 120 mg / dl Electronic Signature(s) Signed: 08/14/2021 11:26:35 AM By: Haywood Pao CHT EMT BS , , Entered By: Haywood Pao on 08/14/2021 11:26:35

## 2021-08-14 NOTE — Progress Notes (Signed)
[name], [name] ([id number]) Visit Report for 08/11/2021 Debridement Details Patient Name: Date of Service: LADALE, SHERBURN 08/11/2021 2:45 PM Medical Record Number: [id number] Patient Account Number: [id number] Date of Birth/Sex: Treating RN: [birth year] (60 y.o. [name], [name] Primary Care Provider: [name] Other Clinician: Referring Provider: Treating Provider/Extender: Avie Echevaria in Treatment: 9 Debridement Performed for Assessment: Wound #3 Left,Plantar Foot Performed By: Physician Maxwell Caul., MD Debridement Type: Debridement Severity of Tissue Pre Debridement: Fat layer exposed Level of Consciousness (Pre-procedure): Awake and Alert Pre-procedure Verification/Time Out Yes - 15:55 Taken: Start Time: 15:55 Pain Control: Lidocaine T Area Debrided (L x W): otal 3 (cm) x 3 (cm) = 9 (cm) Tissue and other material debrided: Viable, Non-Viable, Callus, Slough, Subcutaneous, Skin: Dermis , Skin: Epidermis, Slough Level: Skin/Subcutaneous Tissue Debridement Description: Excisional Instrument: Blade, Curette, Forceps Bleeding: Minimum Hemostasis Achieved: Pressure End Time: 15:55 Procedural Pain: 0 Post Procedural Pain: 0 Response to Treatment: Procedure was tolerated well Level of Consciousness (Post- Awake and Alert procedure): Post Debridement Measurements of Total Wound Length: (cm) 3 Width: (cm) 3 Depth: (cm) 0.3 Volume: (cm) 2.121 Character of Wound/Ulcer Post Debridement: Improved Severity of Tissue Post Debridement: Fat layer exposed Post Procedure Diagnosis Same as Pre-procedure Electronic Signature(s) Signed: 08/12/2021 8:21:05 AM By: Baltazar Najjar MD Signed: 08/14/2021 12:25:45 PM By: Fonnie Mu RN Previous Signature: 08/11/2021 5:23:14 PM Version By: Fonnie Mu RN Entered By: Baltazar Najjar on 08/12/2021 08:01:38 -------------------------------------------------------------------------------- HPI  Details Patient Name: Date of Service: [name] IG 08/11/2021 2:45 PM Medical Record Number: [id number] Patient Account Number: [id number] Date of Birth/Sex: Treating RN: June 18, 1961 (60 y.o. [name] Primary Care Provider: [name] Other Clinician: Referring Provider: Treating Provider/Extender: Avie Echevaria in Treatment: 9 History of Present Illness HPI Description: 10/31/2019 upon evaluation today patient appears to be doing somewhat poorly in regard to his bilateral plantar feet. He has wounds that he tells me have been present since 2012 intermittently off and on. Most recently this has been open for at least the past 6 months to a year. He has been trying to treat this in different ways using Santyl along with various other dressings including Medihoney and even at one point Xeroform. Nothing really has seem to get this completely closed. He was recently in the hospital for cellulitis of his leg subsequently he did have x-rays as well as MRIs that showed negative for any signs of osteomyelitis in regard to the wounds on his feet. Fortunately there is no signs of systemic infection at this time. No fevers, chills, nausea, vomiting, or diarrhea. Patient has previously used Darco offloading shoes as far as frontal floaters as well as postop surgical shoes. He has never been in a total contact cast that may be something we need to strongly consider here. Patient's most recent hemoglobin A1c 1 month ago was 5.3 seems to be very well controlled which is great. Subsequently he has seen vascular as well as podiatry. His ABIs are 1.07 on the left and 1.14 on the right he seems to be doing well he does have chronic venous stasis. 11/07/2019 upon evaluation today patient appears to be doing well with regard to his wounds all things considered. I do not see any severe worsening he still has some callus buildup on the right more than the left he notes that he has  been probably more active than he should as far as walking is concerned is just very hard to not be active. He  knows he needs to be more careful in this regard however. He is willing to give the cast a try at this point although he notes that he is a little nervous about this just with regard to balance although he will be very careful and obviously if he has any trouble he knows to contact the office and let me know. 1/22; patient is in for his obligatory first total contact cast change. Our intake nurse reported a very large amount of drainage which is spelled out over to the surrounding skin. Has bilateral diabetic foot wounds. He has Charcot feet. We have been using silver alginate on his wounds. 11/14/2019 on evaluation today patient is actually seeming to make good improvement in regard to his bilateral plantar foot wounds. We have been using a cast on the left side and on the right side he has been using dressings he is changing up his own accord. With that being said he tells me that he is also not walking as much just due to how unsteady he feels. He takes it easy when he does have to walk and when he does not have to walk he is resting. This is probably help in his right foot as well has the left foot which is actually measuring better. In fact both are measuring better. Overall I am very pleased with how things seem to be progressing. The patient does have some odor on the left foot this does have me concerned about the possibility of infection, and actually probably go ahead and put him on antibiotics today as well as utilizing a continuation of the cast on the left foot I think that will be fine we probably just need to bring him in sooner to change this not last a whole week. 1/29; we brought the patient back today for a total contact cast change on the left out of concern for excessive drainage. We are using drawtex over the wound as the primary dressing 11/21/2019 on evaluation today  patient appears to be doing well with regard to his left plantar foot. In fact both foot ulcers actually seem to be doing pretty well. Nonetheless he is having a lot of drainage on the left at this time and again we did obtain a wound culture did show positive for Staph aureus that was reviewed by myself today as well. Nonetheless he is on Bactrim which was shown to be sensitive that should be helping in this regard. Fortunately there is no signs of infection systemically at this point. 2/5; back in clinic today for a total contact cast change apparently secondary to very significant drainage. Still using drawtex 11/28/2019 upon evaluation today patient appears to be doing well with regard to his wounds. The right foot is doing okay as measured about the same in my opinion. The left foot is actually showing signs of significant improvement is measuring smaller there is a lot of hyper granulation likely due to the continued drainage at this point. We did obtain approval for a snap VAC I think that is good to be appropriate for him and will likely help this tremendously underneath the cast. He is definitely in agreement with proceeding with such. 2/12; patient came in today after his snap VAC lost suction. Brought in to see one of our nurses. The dressing was replaced and then we put the cast back on and rehooked up the snap VAC. Apparently his wound looked very good per our intake nurse. 2/15; again we replaced the cast  on Friday. By Saturday the snap VAC and light suction. He called this morning he comes in acutely. The wounds look fine however the VAC is not functioning. We replaced the cast using silver alginate as the primary dressing backed with Kerramax. The snap VAC was not replaced 12/05/2019 upon evaluation today patient appears to be doing better in regard to his left plantar foot ulcer. Fortunately there is no signs of active infection at this time. Unfortunately he is continuing to have issues  with the right foot he is really not making any progress here things seem to be somewhat stagnant to be honest. The depth has increased but that is due to me having debrided the wound in the past based on what I am seeing. 12/12/2019 on evaluation today patient appears to be doing more poorly in regard to the left lower extremity. He has some erythema spreading up the side of his foot I am concerned about infection again at this point. Unfortunately he has been seeing improvement with a total contact cast but I do not think we should put that on today. On his right plantar foot he continues to have significant drainage this is actually measuring deeper I really do not feel like you are making any progress whatsoever. I have prescribed Granix for him unfortunately his insurance apparently was going to cost him a $500 co-pay. 12/19/2019 upon evaluation today patient actually appears to be doing better in regard to both wounds. With that being said he actually did get the reGranix which he had to pay $500 for. With that being said it does look like that he is actually made some improvement based on what I am seeing at this point with the reGranix. Obviously if he is going to continue this we are going to do something about trying to get him some help in covering the cost. 12/26/2019 on evaluation today patient appears to be doing really much better even compared to last week. Overall the wound seems to be much better even compared to last week and last week was better than the week before. Since has been using the reGranix his symptoms have improved significantly. With that being said the issue right now is simply that this is a very expensive medication for him the first dose cost him $500. Upon inspection patient's wound bed actually is however dramatically improved compared to before he started this 2 weeks ago. 01/02/2020 upon evaluation today patient appears to actually be doing well. He still had a little  bit of reGranix left that has been using in small amounts he just been applying it every other day instead of every day in order to make it go longer. Overall we are still seeing excellent improvement he is measuring smaller looking better healthier tissue and everything seems to be pointing to this headed in the right direction. Fortunately there is no evidence of infection either which is also excellent news. He does have his MRI coming up within the next week. 01/09/2020 upon evaluation today patient appears to be doing a little worse today compared to previous week's evaluation. He is actually been out of the reGranix at this point. He has been trying to make the stretch out so he has been changing the dressings on a regular set schedule like he was previous. I think this has made a difference. Fortunately there is no signs of active infection at this time. No fevers, chills, nausea, vomiting, or diarrhea. 01/16/2020 upon evaluation today patient appears to be  doing well with regard to his left plantar foot ulcer. The right plantar foot still shows some significant depth at this point. Fortunately there is no signs of active infection at this time. 01/30/2020 upon evaluation today patient appears to be doing about the same in regard to his right plantar foot ulcer there is still some depth here and we had to wait till he actually switched over to his new insurance to get approval for the MRI under his new insurance plan. With that being said he now has switched as of April 1. Fortunately there is no signs of active infection at this time. Overall in regard to his left foot ulcer this seems to be doing much better and I am actually very pleased with how things are going. With that being said it is not quite as much progress as we were seeing with the reGranix but at the same time he has had trouble getting this apparently there is been some hindrance here. I Ernie Hew try to actually send this to [facility] that was recommended by the drug rep to me. 02/13/20 upon evaluation today patient appears to be doing better in regard to his left foot ulcer this is great news. Unfortunately the right foot ulcer is not really significantly better at this time. There is no signs of active infection systemically though he did have his MRI which showed unfortunately he does have infection noted including an abscess in the foot. There is also marrow changes noted which are consistent with osteomyelitis based on the radiology review and interpretation. Unfortunately considering that the wound is not really making the progress that we will he would like to be seen I think that this is an indication that he may need some further referral both infectious disease as well as potentially to podiatrist to see if there is anything that can be done to help with the situation that were dealing with here. The left foot again is doing great. READMISSION 06/04/2021 This is a 60 year old man who was in the clinic in 2021 followed by Allen Derry for areas on the right and left foot. He developed a left foot infection and was referred to ID. He left the clinic in a nonhealed state and was followed for a period of [facility] Marylene Land. Apparently things really deteriorated in early July when he was admitted to hospital from 04/27/2021 through 05/06/2021 with sepsis secondary to a left foot infection. His blood cultures were negative. An MRI suggested fifth metatarsal osteomyelitis a left ankle septic joint. He was treated with vancomycin and ertapenem which he is still taking and may just about be finishing. He was seen by orthopedics and the patient adamantly refused to BKA. As far as I can tell he did not have the ankle aspirated I am not exactly sure what the issue was here. Since he has been discharged she is at [facility] for rehabilitation. He was last seen by Dr. Algis Liming on 05/20/2021 he noted  osteo of the tibial talar bone cuboid and fifth metatarsal which is even more extensive than what was suggested by the MRI. He is apparently going for a consultation with orthopedic surgery in [place] sometime next week. I received a call about this man 2 weeks ago from Dr. Allyson Sabal who follows him for the possibility of PAD. ABIs I think done in the office showed a ABI on the right of 1.05 at the PTA and 0.99 at the PTA on the  left. He had a DVT rule out in the left leg that was negative for the DVT. Past medical history is extensive and includes diastolic heart failure, right first metatarsal head ulcer in 2021, excision of the right second ray by Dr. Marylene Land on 03/13/2020, hypertension, hypothyroidism. Left total hip replacement, right total knee replacement, carpal tunnel syndrome, obstructive sleep apnea alcohol abuse with cirrhosis although the patient denies current alcohol intake. The patient does not think he is a diabetic however looking through [place] link I see 2 HgbAic's of this year that were greater than equal to 6.5 which by definition makes him diabetic. Nevertheless he is not on any treatment and does not check his blood sugars. The patient is now back home out of the nursing home. Saw Dr. Algis Liming last week he was taken off vancomycin and ertapenem on August 22 and now is on doxycycline on Augmentin. He also saw Dr. Weston Anna who is his orthopedic surgeon in [place] he recommended a [employer]	[place]. He has been using Medihoney. 9/6I have been having trouble getting hyperbarics approved through our prior authorization process. Even though he had a limb threatening infection in the left foot and probably the left ankle there glitches in how some of the reports are worded also some of the consultants. In any case I am going to repeat his sedimentation rate and C-reactive protein. I am generally not in favor of doing things like this as they really do not alter the plan of care from my point  of view however I am going to need to demonstrate that these remain high in order to get this through forhyperbaric treatment for chronic refractory osteomyelitis 9/13; following this man for a wound on his left plantar foot in the setting of type 2 diabetes and Charcot deformity. He has underlying chronic refractory osteomyelitis. Follow-up sedimentation rate and C-reactive protein were both elevated but the C-reactive protein was down to 1.4, sedimentation rate at 70. Sedimentation rate was only slightly down from previous at 85. His wound is measuring slightly smaller. 9/20; patient started hyperbaric oxygen therapy today and tolerated treatment well. This is for the underlying osteomyelitis. He remains on antibiotics but thinks he is getting close to finishing. The wound on the plantar aspect of his foot is the other issue we are following here. He is using Medihoney The patient has a Charcot foot in the setting of type 2 diabetes. He is going to need a total contact cast although his partner was away this week and we elected to delay this till next week 9/27; patient still tolerating hyperbaric oxygen well. Wound looked generally healthy not much depth under illumination still 100% covered in fibrinous debris. Raised callused edges around the wound he was prepared for a total contact cast. We have been using Medihoney 9/30; patient is back for his first obligatory total contact cast change. We are using Hydrofera Blue. Noted by our intake nurse to have a lot of drainage or at least a moderate amount of drainage. I am not sure I was previously aware of this 10/4; patient arrives today with a lot of drainage under the cast. When he had it changed last Friday there was also a similar amount of drainage. Her intake nurse says that they tried a wound VAC on him perhaps while I was on vacation in August under the cast but that did not work. In my experience that has not been unusual. We have been using  Hydrofera Blue with all the secondary  absorbers. The drainage today went right through all of our dressings. The patient is concerned about his foot being in a cast without much drainage. He is tolerating HBO well. There has been improvements in the wound in the mid part of his foot in the setting of a Charcot deformity 10/7; patient presents for cast change. He has no issues or complaints today. He denies signs of infection. 10/11; patient presents for cast change. At this time he would like to take a break from the cast. He would like to do daily dressing changes with Hydrofera Blue. He currently denies signs of infection. 10/14; his cast was taken off last week at his request. He arrives in the clinic with [facility]. He has been changing the dressing himself. He has way too much edema in the left foot and leg to consider a total contact cast. I do not really know the issue here. He does have chronic venous insufficiency His wife stopped me in the clinic earlier in the week to report he is drinking again and she is concerned. I am uncertain whether there are other issues 10/18; he arrived in clinic last week having bilateral lower extremity edema likely secondary to chronic venous insufficiency there was too much edema in the left leg to apply a total contact cast. I put him in compression on the left leg to control the swelling. This week he cut the wrap on the foot for reasons that are unclear however today he arrives in clinic with a smaller left leg but massive edema in the left foot. He is supposed to be wearing a juxta lite stocking on the right leg but he is not wearing the contact layer. His attendance at hyperbaric oxygen has been dwindling, he did not dive yesterday and he did not dive today concerned about hyperbarics causing swelling. I looked back in his record his last echo was in 2020 essentially normal left and right ventricular function. Last BUN and creatinine were done in July  this was normal. He is having a lot of drainage in the left plantar foot wound 10/25; again he comes in today having missed HBO yesterday. Macerated skin around the wound which really does not look very good at all. A lot of edema in the left foot but an improvement in edema on the left leg. We wrapped him last week because of the amount of swelling in the left leg. We could not apply a total contact cast. The patient states that he wants to be able to change his dressing himself. I might consider this if he had stockings to control the swelling. He said he be here for HBO tomorrow. I will check the degree of erythema in his forefoot which I have marked. He is on doxycycline and Augmentin which was renewed by Dr. Algis Liming but I cannot see a follow-up note, follow-up inflammatory markers etc. I 1 point he said he was going to see his orthopedic surgeon in [place] I am not sure if he ever did this. I do not know why he has not followed up with infectious disease, he says he was not given an appointment but he is still on Augmentin doxycycline Electronic Signature(s) Signed: 08/12/2021 8:21:05 AM By: Baltazar Najjar MD Entered By: Baltazar Najjar on 08/12/2021 08:04:49 -------------------------------------------------------------------------------- Physical Exam Details Patient Name: Date of Service: [name] IG 08/11/2021 2:45 PM Medical Record Number: [id number] Patient Account Number: [id number] Date of Birth/Sex: Treating RN: Apr 18, 1961 (60 y.o. [name] Primary Care  Provider: Other Clinician: [name] Referring Provider: Treating Provider/Extender: Avie Echevaria in Treatment: 9 Constitutional Sitting or standing Blood Pressure is within target range for patient.. Pulse regular and within target range for patient.Marland Kitchen Respirations regular, non-labored and within target range.. Temperature is normal and within the target range for the patient.Marland Kitchen Appears in  no distress. Notes Wound exam; the patient's wound does not look healthy. Macerated loose unconnected skin and soft tissue around the wound edge. Surface of the wound does not look healthy. Initially debrided the wound with pickups and a #15 scalpel to remove nonadherent tissue and then the surface of the wound for nonviable fibrinous debris on the surface. His foot is very swollen diffusely. Erythema to the forefoot but no tenderness in the setting of very significant neuropathy. This does not look like cellulitis to me it is not particularly warm I think this is from retained fluid in this area but I will follow carefully. Electronic Signature(s) Signed: 08/12/2021 8:21:05 AM By: Baltazar Najjar MD Entered By: Baltazar Najjar on 08/12/2021 08:06:20 -------------------------------------------------------------------------------- Physician Orders Details Patient Name: Date of Service: DELAWRENCE, FRIDMAN IG 08/11/2021 2:45 PM Medical Record Number: [id number] Patient Account Number: [id number] Date of Birth/Sex: Treating RN: 07/17/1961 (60 y.o. [name] Primary Care Provider: [name] Other Clinician: Referring Provider: Treating Provider/Extender: Avie Echevaria in Treatment: 9 Verbal / Phone Orders: No Diagnosis Coding Follow-up Appointments ppointment in 1 week. - Dr. Leanord Hawking!!! Return A Nurse Visit: - tomorrow 08/12/2021 Bathing/ Shower/ Hygiene May shower and wash wound with soap and water. - prior to dressing change Edema Control - Lymphedema / SCD / Other Elevate legs to the level of the heart or above for 30 minutes daily and/or when sitting, a frequency of: - throughout the day Avoid standing for long periods of time. Exercise regularly Moisturize legs daily. Compression stocking or Garment 30-40 mm/Hg pressure to: - wear the juxtalite HD to right and left leg. apply in the morning and remove at night. Off-Loading Total Contact Cast to Left Lower  Extremity - Size 4 Hold due to swelling. Additional Orders / Instructions Follow Nutritious Diet Hyperbaric Oxygen Therapy Wound #3 Left,Plantar Foot Evaluate for HBO Therapy - Starts 07/07/21 Indication: - Chronic Refractory Osteomyelitis 2.5 ATA for 90 Minutes with 2 Five (5) Minute A Breaks ir Total Number of Treatments: - 40 One treatments per day (delivered Monday through Friday unless otherwise specified in Special Instructions below): Finger stick Blood Glucose Pre- and Post- HBOT Treatment. Follow Hyperbaric Oxygen Glycemia Protocol Afrin (Oxymetazoline HCL) 0.05% nasal spray - 1 spray in both nostrils daily as needed prior to HBO treatment for difficulty clearing ears Wound Treatment Wound #3 - Foot Wound Laterality: Plantar, Left Cleanser: Soap and Water 1 x Per Day/15 Days Discharge Instructions: May shower and wash wound with dial antibacterial soap and water prior to dressing change. Cleanser: Wound Cleanser (DME) (Generic) 1 x Per Day/15 Days Discharge Instructions: Cleanse the wound with wound cleanser prior to applying a clean dressing using gauze sponges, not tissue or cotton balls. Peri-Wound Care: Zinc Oxide Ointment 30g tube 1 x Per Day/15 Days Discharge Instructions: Apply Zinc Oxide to periwound with each dressing change Prim Dressing: KerraCel Ag Gelling Fiber Dressing, 4x5 in (silver alginate) (DME) (Generic) 1 x Per Day/15 Days ary Discharge Instructions: Apply silver alginate to wound bed as instructed Secondary Dressing: ABD Pad, 5x9 (DME) (Generic) 1 x Per Day/15 Days Discharge Instructions: Apply over primary dressing as directed. Secondary Dressing:  Zetuvit Plus 4x8 in (DME) (Generic) 1 x Per Day/15 Days Discharge Instructions: Apply over primary dressing as directed. Secured With: [employer], 4.5x3.1 (in/yd) (DME) (Generic) 1 x Per Day/15 Days Discharge Instructions: Secure with Kerlix as directed. Secured With: 10M Medipore H Soft Cloth Surgical  Tape, 2x2 (in/yd) (DME) (Generic) 1 x Per Day/15 Days Discharge Instructions: Secure dressing with tape as directed. Compression Stockings: Circaid Juxta Lite Compression Wrap (DME) Left Leg Compression Amount: 30-40 mmHG Discharge Instructions: Apply Circaid Juxta Lite Compression Wrap daily as instructed. Apply first thing in the morning, remove at night before bed. Laboratory C reactive protein [Mass/volume] in Serum or Plasma (CHEM) LOINC Code: 1988-5 Convenience Name: C Reactive Protein in serum or plasma Erythrocyte sedimentation rate (HEM) LOINC Code: 16109-6 Convenience Name: Sed rate-method unspecified CBC W A Differential panel in Blood (HEM-CBC) uto LOINC Code: [id number] Convenience Name: CBC W Auto Differential panel GLYCEMIA INTERVENTIONS PROTOCOL PRE-HBO GLYCEMIA INTERVENTIONS ACTION INTERVENTION Obtain pre-HBO capillary blood glucose (ensure 1 physician order is in chart). A. Notify HBO physician and await physician orders. 2 If result is 70 mg/dl or below: B. If the result meets the hospital definition of a critical result, follow hospital policy. A. Give patient an 8 ounce Glucerna Shake, an 8 ounce Ensure, or 8 ounces of a Glucerna/Ensure equivalent dietary supplement*. B. Wait 30 minutes. If result is 71 mg/dl to 045 mg/dl: C. Retest patients capillary blood glucose (CBG). D. If result greater than or equal to 110 mg/dl, proceed with HBO. If result less than 110 mg/dl, notify HBO physician and consider holding HBO. If result is 131 mg/dl to 409 mg/dl: A. Proceed with HBO. A. Notify HBO physician and await physician orders. B. It is recommended to hold HBO and do If result is 250 mg/dl or greater: blood/urine ketone testing. C. If the result meets the hospital definition of a critical result, follow hospital policy. POST-HBO GLYCEMIA INTERVENTIONS ACTION INTERVENTION Obtain post HBO capillary blood glucose (ensure 1 physician order is in  chart). A. Notify HBO physician and await physician orders. 2 If result is 70 mg/dl or below: B. If the result meets the hospital definition of a critical result, follow hospital policy. A. Give patient an 8 ounce Glucerna Shake, an 8 ounce Ensure, or 8 ounces of a Glucerna/Ensure equivalent dietary supplement*. B. Wait 15 minutes for symptoms of If result is 71 mg/dl to 811 mg/dl: hypoglycemia (i.e. nervousness, anxiety, sweating, chills, clamminess, irritability, confusion, tachycardia or dizziness). C. If patient asymptomatic, discharge patient. If patient symptomatic, repeat capillary blood glucose (CBG) and notify HBO physician. If result is 101 mg/dl to 914 mg/dl: A. Discharge patient. A. Notify HBO physician and await physician orders. B. It is recommended to do blood/urine ketone If result is 250 mg/dl or greater: testing. C. If the result meets the hospital definition of a critical result, follow hospital policy. *Juice or candies are NOT equivalent products. If patient refuses the Glucerna or Ensure, please consult the hospital dietitian for an appropriate substitute. Electronic Signature(s) Signed: 08/11/2021 5:23:14 PM By: Fonnie Mu RN Signed: 08/12/2021 8:21:05 AM By: Baltazar Najjar MD Entered By: Fonnie Mu on 08/11/2021 16:23:17 Prescription 08/11/2021 -------------------------------------------------------------------------------- [name] MD Patient Name: Provider: [birth year] [id number] Date of Birth: NPI#: Judie Petit [id number] Sex: DEA #: [id number] Phone #: License #: Eligha Bridegroom [facility] Patient Address: [address] [place], [place] [address] [address] [place], [place] [address] [phone] Allergies Claritin Provider's Orders  C reactive protein [Mass/volume] in Serum or Plasma LOINC Code: 1988-5 Convenience Name: C Reactive Protein in serum or plasma Hand  Signature: Date(s): Prescription 08/11/2021 [name] MD Patient Name: Provider: 12-26-1960 [id number] Date of Birth: NPI#: Judie Petit [id number] Sex: DEA #: [phone] [id number] Phone #: License #: Eligha Bridegroom [facility] Patient Address: [address] [place], [place] [address] [address] [place], [place] [address] [phone] Allergies Claritin Provider's Orders Erythrocyte sedimentation rate LOINC Code: 59093-1 Convenience Name: Sed rate-method unspecified Hand Signature: Date(s): Prescription 08/11/2021 [name] MD Patient Name: Provider: 09-13-1961 [id number] Date of Birth: NPI#: Judie Petit [id number] Sex: DEA #: [phone] Phone #: License #: Eligha Bridegroom [facility] Patient Address: [address] [place], [place] [address] [address] [place], [place] [address] [phone] Allergies Claritin Provider's Orders CBC W A Differential panel in Blood uto LOINC Code: [phone] Convenience Name: CBC W Auto Differential panel Hand Signature: Date(s): Electronic Signature(s) Signed: 08/11/2021 5:23:14 PM By: Fonnie Mu RN Signed: 08/12/2021 8:21:05 AM By: Baltazar Najjar MD Entered By: Fonnie Mu on 08/11/2021 16:23:18 -------------------------------------------------------------------------------- Problem List Details Patient Name: Date of Service: EREN, HETZEL 08/11/2021 2:45 PM Medical Record Number: [id number] Patient Account Number: [id number] Date of Birth/Sex: Treating RN: 1961/07/22 (60 y.o. [name] Primary Care Provider: [name] Other Clinician: Referring Provider: Treating Provider/Extender: Avie Echevaria in Treatment: 9 Active Problems ICD-10 Encounter Code Description Active Date MDM Diagnosis E11.621 Type 2 diabetes mellitus with foot ulcer 06/04/2021 No Yes M14.672 Charcot's joint,  left ankle and foot 06/04/2021 No Yes L97.528 Non-pressure chronic ulcer of other part of left foot with other specified 06/04/2021 No Yes severity M86.372 Chronic multifocal osteomyelitis, left ankle and foot 06/04/2021 No Yes Inactive Problems Resolved Problems Electronic Signature(s) Signed: 08/12/2021 8:21:05 AM By: Baltazar Najjar MD Entered By: Baltazar Najjar on 08/12/2021 08:01:04 -------------------------------------------------------------------------------- Progress Note Details Patient Name: Date of Service: [name] IG 08/11/2021 2:45 PM Medical Record Number: [id number] Patient Account Number: [id number] Date of Birth/Sex: Treating RN: 1961-07-06 (60 y.o. [name] Primary Care Provider: [name] Other Clinician: Referring Provider: Treating Provider/Extender: Avie Echevaria in Treatment: 9 Subjective History of Present Illness (HPI) 10/31/2019 upon evaluation today patient appears to be doing somewhat poorly in regard to his bilateral plantar feet. He has wounds that he tells me have been present since 2012 intermittently off and on. Most recently this has been open for at least the past 6 months to a year. He has been trying to treat this in different ways using Santyl along with various other dressings including Medihoney and even at one point Xeroform. Nothing really has seem to get this completely closed. He was recently in the hospital for cellulitis of his leg subsequently he did have x-rays as well as MRIs that showed negative for any signs of osteomyelitis in regard to the wounds on his feet. Fortunately there is no signs of systemic infection at this time. No fevers, chills, nausea, vomiting, or diarrhea. Patient has previously used Darco offloading shoes as far as frontal floaters as well as postop surgical shoes. He has never been in a total contact cast that may be something we need to strongly consider here. Patient's most  recent hemoglobin A1c 1 month ago was 5.3 seems to be very well controlled which is great. Subsequently he has seen vascular as well as podiatry. His ABIs are  1.07 on the left and 1.14 on the right he seems to be doing well he does have chronic venous stasis. 11/07/2019 upon evaluation today patient appears to be doing well with regard to his wounds all things considered. I do not see any severe worsening he still has some callus buildup on the right more than the left he notes that he has been probably more active than he should as far as walking is concerned is just very hard to not be active. He knows he needs to be more careful in this regard however. He is willing to give the cast a try at this point although he notes that he is a little nervous about this just with regard to balance although he will be very careful and obviously if he has any trouble he knows to contact the office and let me know. 1/22; patient is in for his obligatory first total contact cast change. Our intake nurse reported a very large amount of drainage which is spelled out over to the surrounding skin. Has bilateral diabetic foot wounds. He has Charcot feet. We have been using silver alginate on his wounds. 11/14/2019 on evaluation today patient is actually seeming to make good improvement in regard to his bilateral plantar foot wounds. We have been using a cast on the left side and on the right side he has been using dressings he is changing up his own accord. With that being said he tells me that he is also not walking as much just due to how unsteady he feels. He takes it easy when he does have to walk and when he does not have to walk he is resting. This is probably help in his right foot as well has the left foot which is actually measuring better. In fact both are measuring better. Overall I am very pleased with how things seem to be progressing. The patient does have some odor on the left foot this does have me  concerned about the possibility of infection, and actually probably go ahead and put him on antibiotics today as well as utilizing a continuation of the cast on the left foot I think that will be fine we probably just need to bring him in sooner to change this not last a whole week. 1/29; we brought the patient back today for a total contact cast change on the left out of concern for excessive drainage. We are using drawtex over the wound as the primary dressing 11/21/2019 on evaluation today patient appears to be doing well with regard to his left plantar foot. In fact both foot ulcers actually seem to be doing pretty well. Nonetheless he is having a lot of drainage on the left at this time and again we did obtain a wound culture did show positive for Staph aureus that was reviewed by myself today as well. Nonetheless he is on Bactrim which was shown to be sensitive that should be helping in this regard. Fortunately there is no signs of infection systemically at this point. 2/5; back in clinic today for a total contact cast change apparently secondary to very significant drainage. Still using drawtex 11/28/2019 upon evaluation today patient appears to be doing well with regard to his wounds. The right foot is doing okay as measured about the same in my opinion. The left foot is actually showing signs of significant improvement is measuring smaller there is a lot of hyper granulation likely due to the continued drainage at this point. We did obtain  approval for a snap VAC I think that is good to be appropriate for him and will likely help this tremendously underneath the cast. He is definitely in agreement with proceeding with such. 2/12; patient came in today after his snap VAC lost suction. Brought in to see one of our nurses. The dressing was replaced and then we put the cast back on and rehooked up the snap VAC. Apparently his wound looked very good per our intake nurse. 2/15; again we replaced the  cast on Friday. By Saturday the snap VAC and light suction. He called this morning he comes in acutely. The wounds look fine however the VAC is not functioning. We replaced the cast using silver alginate as the primary dressing backed with Kerramax. The snap VAC was not replaced 12/05/2019 upon evaluation today patient appears to be doing better in regard to his left plantar foot ulcer. Fortunately there is no signs of active infection at this time. Unfortunately he is continuing to have issues with the right foot he is really not making any progress here things seem to be somewhat stagnant to be honest. The depth has increased but that is due to me having debrided the wound in the past based on what I am seeing. 12/12/2019 on evaluation today patient appears to be doing more poorly in regard to the left lower extremity. He has some erythema spreading up the side of his foot I am concerned about infection again at this point. Unfortunately he has been seeing improvement with a total contact cast but I do not think we should put that on today. On his right plantar foot he continues to have significant drainage this is actually measuring deeper I really do not feel like you are making any progress whatsoever. I have prescribed Granix for him unfortunately his insurance apparently was going to cost him a $500 co-pay. 12/19/2019 upon evaluation today patient actually appears to be doing better in regard to both wounds. With that being said he actually did get the reGranix which he had to pay $500 for. With that being said it does look like that he is actually made some improvement based on what I am seeing at this point with the reGranix. Obviously if he is going to continue this we are going to do something about trying to get him some help in covering the cost. 12/26/2019 on evaluation today patient appears to be doing really much better even compared to last week. Overall the wound seems to be much better  even compared to last week and last week was better than the week before. Since has been using the reGranix his symptoms have improved significantly. With that being said the issue right now is simply that this is a very expensive medication for him the first dose cost him $500. Upon inspection patient's wound bed actually is however dramatically improved compared to before he started this 2 weeks ago. 01/02/2020 upon evaluation today patient appears to actually be doing well. He still had a little bit of reGranix left that has been using in small amounts he just been applying it every other day instead of every day in order to make it go longer. Overall we are still seeing excellent improvement he is measuring smaller looking better healthier tissue and everything seems to be pointing to this headed in the right direction. Fortunately there is no evidence of infection either which is also excellent news. He does have his MRI coming up within the next week. 01/09/2020  upon evaluation today patient appears to be doing a little worse today compared to previous week's evaluation. He is actually been out of the reGranix at this point. He has been trying to make the stretch out so he has been changing the dressings on a regular set schedule like he was previous. I think this has made a difference. Fortunately there is no signs of active infection at this time. No fevers, chills, nausea, vomiting, or diarrhea. 01/16/2020 upon evaluation today patient appears to be doing well with regard to his left plantar foot ulcer. The right plantar foot still shows some significant depth at this point. Fortunately there is no signs of active infection at this time. 01/30/2020 upon evaluation today patient appears to be doing about the same in regard to his right plantar foot ulcer there is still some depth here and we had to wait till he actually switched over to his new insurance to get approval for the MRI under his new  insurance plan. With that being said he now has switched as of April 1. Fortunately there is no signs of active infection at this time. Overall in regard to his left foot ulcer this seems to be doing much better and I am actually very pleased with how things are going. With that being said it is not quite as much progress as we were seeing with the reGranix but at the same time he has had trouble getting this apparently there is been some hindrance here. I Ernie Hew try to actually send this to [facility] that was recommended by the drug rep to me. 02/13/20 upon evaluation today patient appears to be doing better in regard to his left foot ulcer this is great news. Unfortunately the right foot ulcer is not really significantly better at this time. There is no signs of active infection systemically though he did have his MRI which showed unfortunately he does have infection noted including an abscess in the foot. There is also marrow changes noted which are consistent with osteomyelitis based on the radiology review and interpretation. Unfortunately considering that the wound is not really making the progress that we will he would like to be seen I think that this is an indication that he may need some further referral both infectious disease as well as potentially to podiatrist to see if there is anything that can be done to help with the situation that were dealing with here. The left foot again is doing great. READMISSION 06/04/2021 This is a 60 year old man who was in the clinic in 2021 followed by Allen Derry for areas on the right and left foot. He developed a left foot infection and was referred to ID. He left the clinic in a nonhealed state and was followed for a period of [facility] Marylene Land. Apparently things really deteriorated in early July when he was admitted to hospital from 04/27/2021 through 05/06/2021 with sepsis secondary to a left foot infection. His blood  cultures were negative. An MRI suggested fifth metatarsal osteomyelitis a left ankle septic joint. He was treated with vancomycin and ertapenem which he is still taking and may just about be finishing. He was seen by orthopedics and the patient adamantly refused to BKA. As far as I can tell he did not have the ankle aspirated I am not exactly sure what the issue was here. Since he has been discharged she is at [facility] health care for rehabilitation. He was last seen by Dr. Algis Liming on  05/20/2021 he noted osteo of the tibial talar bone cuboid and fifth metatarsal which is even more extensive than what was suggested by the MRI. He is apparently going for a consultation with orthopedic surgery in [place] sometime next week. I received a call about this man 2 weeks ago from Dr. Allyson Sabal who follows him for the possibility of PAD. ABIs I think done in the office showed a ABI on the right of 1.05 at the PTA and 0.99 at the PTA on the left. He had a DVT rule out in the left leg that was negative for the DVT. Past medical history is extensive and includes diastolic heart failure, right first metatarsal head ulcer in 2021, excision of the right second ray by Dr. Marylene Land on 03/13/2020, hypertension, hypothyroidism. Left total hip replacement, right total knee replacement, carpal tunnel syndrome, obstructive sleep apnea alcohol abuse with cirrhosis although the patient denies current alcohol intake. The patient does not think he is a diabetic however looking through [place] link I see 2 HgbAic's of this year that were greater than equal to 6.5 which by definition makes him diabetic. Nevertheless he is not on any treatment and does not check his blood sugars. The patient is now back home out of the nursing home. Saw Dr. Algis Liming last week he was taken off vancomycin and ertapenem on August 22 and now is on doxycycline on Augmentin. He also saw Dr. Weston Anna who is his orthopedic surgeon in [place] he recommended a  [facility]	[place]. He has been using Medihoney. 9/6I have been having trouble getting hyperbarics approved through our prior authorization process. Even though he had a limb threatening infection in the left foot and probably the left ankle there glitches in how some of the reports are worded also some of the consultants. In any case I am going to repeat his sedimentation rate and C-reactive protein. I am generally not in favor of doing things like this as they really do not alter the plan of care from my point of view however I am going to need to demonstrate that these remain high in order to get this through forhyperbaric treatment for chronic refractory osteomyelitis 9/13; following this man for a wound on his left plantar foot in the setting of type 2 diabetes and Charcot deformity. He has underlying chronic refractory osteomyelitis. Follow-up sedimentation rate and C-reactive protein were both elevated but the C-reactive protein was down to 1.4, sedimentation rate at 70. Sedimentation rate was only slightly down from previous at 85. His wound is measuring slightly smaller. 9/20; patient started hyperbaric oxygen therapy today and tolerated treatment well. This is for the underlying osteomyelitis. He remains on antibiotics but thinks he is getting close to finishing. The wound on the plantar aspect of his foot is the other issue we are following here. He is using Medihoney The patient has a Charcot foot in the setting of type 2 diabetes. He is going to need a total contact cast although his partner was away this week and we elected to delay this till next week 9/27; patient still tolerating hyperbaric oxygen well. Wound looked generally healthy not much depth under illumination still 100% covered in fibrinous debris. Raised callused edges around the wound he was prepared for a total contact cast. We have been using Medihoney 9/30; patient is back for his first obligatory total contact cast change. We are  using Hydrofera Blue. Noted by our intake nurse to have a lot of drainage or at least a moderate  amount of drainage. I am not sure I was previously aware of this 10/4; patient arrives today with a lot of drainage under the cast. When he had it changed last Friday there was also a similar amount of drainage. Her intake nurse says that they tried a wound VAC on him perhaps while I was on vacation in August under the cast but that did not work. In my experience that has not been unusual. We have been using Hydrofera Blue with all the secondary absorbers. The drainage today went right through all of our dressings. The patient is concerned about his foot being in a cast without much drainage. He is tolerating HBO well. There has been improvements in the wound in the mid part of his foot in the setting of a Charcot deformity 10/7; patient presents for cast change. He has no issues or complaints today. He denies signs of infection. 10/11; patient presents for cast change. At this time he would like to take a break from the cast. He would like to do daily dressing changes with Hydrofera Blue. He currently denies signs of infection. 10/14; his cast was taken off last week at his request. He arrives in the clinic with [facility]. He has been changing the dressing himself. He has way too much edema in the left foot and leg to consider a total contact cast. I do not really know the issue here. He does have chronic venous insufficiency His wife stopped me in the clinic earlier in the week to report he is drinking again and she is concerned. I am uncertain whether there are other issues 10/18; he arrived in clinic last week having bilateral lower extremity edema likely secondary to chronic venous insufficiency there was too much edema in the left leg to apply a total contact cast. I put him in compression on the left leg to control the swelling. This week he cut the wrap on the foot for reasons that are unclear  however today he arrives in clinic with a smaller left leg but massive edema in the left foot. He is supposed to be wearing a juxta lite stocking on the right leg but he is not wearing the contact layer. His attendance at hyperbaric oxygen has been dwindling, he did not dive yesterday and he did not dive today concerned about hyperbarics causing swelling. I looked back in his record his last echo was in 2020 essentially normal left and right ventricular function. Last BUN and creatinine were done in July this was normal. He is having a lot of drainage in the left plantar foot wound 10/25; again he comes in today having missed HBO yesterday. Macerated skin around the wound which really does not look very good at all. A lot of edema in the left foot but an improvement in edema on the left leg. We wrapped him last week because of the amount of swelling in the left leg. We could not apply a total contact cast. The patient states that he wants to be able to change his dressing himself. I might consider this if he had stockings to control the swelling. He said he be here for HBO tomorrow. I will check the degree of erythema in his forefoot which I have marked. He is on doxycycline and Augmentin which was renewed by Dr. Algis Liming but I cannot see a follow-up note, follow-up inflammatory markers etc. I 1 point he said he was going to see his orthopedic surgeon in [place] I am not sure  if he ever did this. I do not know why he has not followed up with infectious disease, he says he was not given an appointment but he is still on Augmentin doxycycline Objective Constitutional Sitting or standing Blood Pressure is within target range for patient.. Pulse regular and within target range for patient.Marland Kitchen Respirations regular, non-labored and within target range.. Temperature is normal and within the target range for the patient.Marland Kitchen Appears in no distress. Vitals Time Taken: 3:14 PM, Weight: 270 lbs, Temperature: 98 F,  Pulse: 98 bpm, Respiratory Rate: 17 breaths/min, Blood Pressure: 126/74 mmHg. General Notes: Wound exam; the patient's wound does not look healthy. Macerated loose unconnected skin and soft tissue around the wound edge. Surface of the wound does not look healthy. Initially debrided the wound with pickups and a #15 scalpel to remove nonadherent tissue and then the surface of the wound for nonviable fibrinous debris on the surface. His foot is very swollen diffusely. Erythema to the forefoot but no tenderness in the setting of very significant neuropathy. This does not look like cellulitis to me it is not particularly warm I think this is from retained fluid in this area but I will follow carefully. Integumentary (Hair, Skin) Wound #3 status is Open. Original cause of wound was Gradually Appeared. The date acquired was: 03/18/2021. The wound has been in treatment 9 weeks. The wound is located on the Left,Plantar Foot. The wound measures 3cm length x 3cm width x 0.3cm depth; 7.069cm^2 area and 2.121cm^3 volume. There is Fat Layer (Subcutaneous Tissue) exposed. There is no tunneling noted, however, there is undermining starting at 12:00 and ending at 12:00 with a maximum distance of 2.7cm. There is a large amount of serosanguineous drainage noted. The wound margin is well defined and not attached to the wound base. There is large (67-100%) red granulation within the wound bed. There is a small (1-33%) amount of necrotic tissue within the wound bed including Adherent Slough. Assessment Active Problems ICD-10 Type 2 diabetes mellitus with foot ulcer Charcot's joint, left ankle and foot Non-pressure chronic ulcer of other part of left foot with other specified severity Chronic multifocal osteomyelitis, left ankle and foot Procedures Wound #3 Pre-procedure diagnosis of Wound #3 is a Diabetic Wound/Ulcer of the Lower Extremity located on the Left,Plantar Foot .Severity of Tissue Pre Debridement is: Fat  layer exposed. There was a Excisional Skin/Subcutaneous Tissue Debridement with a total area of 9 sq cm performed by Maxwell Caul., MD. With the following instrument(s): Blade, Curette, and Forceps to remove Viable and Non-Viable tissue/material. Material removed includes Callus, Subcutaneous Tissue, Slough, Skin: Dermis, and Skin: Epidermis after achieving pain control using Lidocaine. No specimens were taken. A time out was conducted at 15:55, prior to the start of the procedure. A Minimum amount of bleeding was controlled with Pressure. The procedure was tolerated well with a pain level of 0 throughout and a pain level of 0 following the procedure. Post Debridement Measurements: 3cm length x 3cm width x 0.3cm depth; 2.121cm^3 volume. Character of Wound/Ulcer Post Debridement is improved. Severity of Tissue Post Debridement is: Fat layer exposed. Post procedure Diagnosis Wound #3: Same as Pre-Procedure Plan Follow-up Appointments: Return Appointment in 1 week. - Dr. Leanord Hawking!!! Nurse Visit: - tomorrow 08/12/2021 Bathing/ Shower/ Hygiene: May shower and wash wound with soap and water. - prior to dressing change Edema Control - Lymphedema / SCD / Other: Elevate legs to the level of the heart or above for 30 minutes daily and/or when sitting, a frequency  of: - throughout the day Avoid standing for long periods of time. Exercise regularly Moisturize legs daily. Compression stocking or Garment 30-40 mm/Hg pressure to: - wear the juxtalite HD to right and left leg. apply in the morning and remove at night. Off-Loading: T Contact Cast to Left Lower Extremity - Size 4 Hold due to swelling. otal Additional Orders / Instructions: Follow Nutritious Diet Hyperbaric Oxygen Therapy: Wound #3 Left,Plantar Foot: Evaluate for HBO Therapy - Starts 07/07/21 Indication: - Chronic Refractory Osteomyelitis 2.5 ATA for 90 Minutes with 2 Five (5) Minute Air Breaks T Number of Treatments: - 40 otal One  treatments per day (delivered Monday through Friday unless otherwise specified in Special Instructions below): Finger stick Blood Glucose Pre- and Post- HBOT Treatment. Follow Hyperbaric Oxygen Glycemia Protocol Afrin (Oxymetazoline HCL) 0.05% nasal spray - 1 spray in both nostrils daily as needed prior to HBO treatment for difficulty clearing ears Laboratory ordered were: C Reactive Protein in serum or plasma, Sed rate -method unspecified, CBC W Auto Differential panel WOUND #3: - Foot Wound Laterality: Plantar, Left Cleanser: Soap and Water 1 x Per Day/15 Days Discharge Instructions: May shower and wash wound with dial antibacterial soap and water prior to dressing change. Cleanser: Wound Cleanser (DME) (Generic) 1 x Per Day/15 Days Discharge Instructions: Cleanse the wound with wound cleanser prior to applying a clean dressing using gauze sponges, not tissue or cotton balls. Peri-Wound Care: Zinc Oxide Ointment 30g tube 1 x Per Day/15 Days Discharge Instructions: Apply Zinc Oxide to periwound with each dressing change Prim Dressing: KerraCel Ag Gelling Fiber Dressing, 4x5 in (silver alginate) (DME) (Generic) 1 x Per Day/15 Days ary Discharge Instructions: Apply silver alginate to wound bed as instructed Secondary Dressing: ABD Pad, 5x9 (DME) (Generic) 1 x Per Day/15 Days Discharge Instructions: Apply over primary dressing as directed. Secondary Dressing: Zetuvit Plus 4x8 in (DME) (Generic) 1 x Per Day/15 Days Discharge Instructions: Apply over primary dressing as directed. Secured With: [employer], 4.5x3.1 (in/yd) (DME) (Generic) 1 x Per Day/15 Days Discharge Instructions: Secure with Kerlix as directed. Secured With: 79M Medipore H Soft Cloth Surgical T ape, 2x2 (in/yd) (DME) (Generic) 1 x Per Day/15 Days Discharge Instructions: Secure dressing with tape as directed. Com pression Stockings: Circaid Juxta Lite Compression Wrap (DME) Compression Amount: 30-40 mmHg (left) Discharge  Instructions: Apply Circaid Juxta Lite Compression Wrap daily as instructed. Apply first thing in the morning, remove at night before bed. #1 changed back to silver alginate on this. We put him in kerlix Coban we will look into this again tomorrow. I am not sure what stockings he has at home. He has a juxta light on the right leg but says he does not have 1 of these for left. This predates our clinic 2. The exact status of the extensive underlying osteomyelitis in the left foot is not completely clear to me. I have ordered inflammatory markers after not being able to see follow-up with infectious disease 3. He had extensive edema in the left leg last week. He has chronic venous insufficiency that is a lot better today but he has uncontrolled edema in the left foot hopefully this was a wrap issue. I will try to check his foot tomorrow. If some of this is cellulitis or the erythema extends he will probably need hospitalization as he is already on Augmentin and doxycycline 4. Really too much drainage for any consideration of repeat total contact cast. I am also concerned about the possibility of underlying infection  5. Mole[employer] plan for next week Electronic Signature(s) Signed: 08/12/2021 8:21:05 AM By: Baltazar Najjar MD Entered By: Baltazar Najjar on 08/12/2021 08:08:45 -------------------------------------------------------------------------------- SuperBill Details Patient Name: Date of Service: [name] IG 08/11/2021 Medical Record Number: [id number] Patient Account Number: [id number] Date of Birth/Sex: Treating RN: 02-19-1961 (60 y.o. [name], [name] Primary Care Provider: [name] Other Clinician: Referring Provider: Treating Provider/Extender: Avie Echevaria in Treatment: 9 Diagnosis Coding ICD-10 Codes Code Description E11.621 Type 2 diabetes mellitus with foot ulcer M14.672 Charcot's joint, left ankle and foot L97.528 Non-pressure chronic  ulcer of other part of left foot with other specified severity M86.372 Chronic multifocal osteomyelitis, left ankle and foot Facility Procedures CPT4 Code: 33295188 Description: 11042 - DEB SUBQ TISSUE 20 SQ CM/< ICD-10 Diagnosis Description L97.528 Non-pressure chronic ulcer of other part of left foot with other specified seve Modifier: rity Quantity: 1 Physician Procedures : CPT4 Code Description Modifier [id number] 11042 - WC PHYS SUBQ TISS 20 SQ CM ICD-10 Diagnosis Description L97.528 Non-pressure chronic ulcer of other part of left foot with other specified severity Quantity: 1 Electronic Signature(s) Signed: 08/12/2021 8:21:05 AM By: Baltazar Najjar MD Previous Signature: 08/11/2021 5:23:14 PM Version By: Fonnie Mu RN Entered By: Baltazar Najjar on 08/12/2021 08:08:59

## 2021-08-14 NOTE — Progress Notes (Signed)
[name], [name] ([id number]) Visit Report for 08/12/2021 SuperBill Details Patient Name: Date of Service: [name], [name] 08/12/2021 Medical Record Number: [id number] Patient Account Number: [id number] Date of Birth/Sex: Treating RN: [birth year] (60 y.o. [name] Primary Care Provider: [name] Other Clinician: Referring Provider: Treating Provider/Extender: Avie Echevaria in Treatment: 9 Diagnosis Coding ICD-10 Codes Code Description E11.621 Type 2 diabetes mellitus with foot ulcer M14.672 Charcot's joint, left ankle and foot L97.528 Non-pressure chronic ulcer of other part of left foot with other specified severity M86.372 Chronic multifocal osteomyelitis, left ankle and foot Facility Procedures CPT4 Code Description Modifier Quantity [id number] 99213 - WOUND CARE VISIT-LEV 3 EST PT 1 Electronic Signature(s) Signed: 08/12/2021 4:58:10 PM By: Baltazar Najjar MD Signed: 08/14/2021 12:25:45 PM By: Fonnie Mu RN Entered By: Fonnie Mu on 08/12/2021 14:23:16

## 2021-08-14 NOTE — Progress Notes (Addendum)
[name], [name] ([id number]) Visit Report for 08/13/2021 Arrival Information Details Patient Name: Date of Service: [name], [name] 08/13/2021 10:00 A M Medical Record Number: [id number] Patient Account Number: [id number] Date of Birth/Sex: Treating RN: 09-14-1961 (60 y.o. [name], [name] Primary Care Angeles Paolucci: [name] Other Clinician: Haywood Pao Referring Chevella Pearce: Treating Delmar Dondero/Extender: Avie Echevaria in Treatment: 10 Visit Information History Since Last Visit All ordered tests and consults were completed: Yes Patient Arrived: [name] Added or deleted any medications: No Arrival Time: 10:15 Any new allergies or adverse reactions: No Accompanied By: self Had a fall or experienced change in No Transfer Assistance: None activities of daily living that may affect Patient Identification Verified: Yes risk of falls: Secondary Verification Process Completed: Yes Signs or symptoms of abuse/neglect since last visito No Patient Requires Transmission-Based Precautions: No Hospitalized since last visit: No Patient Has Alerts: Yes Implantable device outside of the clinic excluding No cellular tissue based products placed in the center since last visit: Pain Present Now: No Electronic Signature(s) Signed: 08/13/2021 1:44:21 PM By: Haywood Pao CHT EMT BS , , Entered By: Haywood Pao on 08/13/2021 13:44:20 -------------------------------------------------------------------------------- Vitals Details Patient Name: Date of Service: [name] 08/13/2021 10:00 A M Medical Record Number: [id number] Patient Account Number: [id number] Date of Birth/Sex: Treating RN: 02-Jul-1961 (60 y.o. [name] Primary Care Paisly Fingerhut: [name] Other Clinician: Haywood Pao Referring Von Quintanar: Treating Davona Kinoshita/Extender: Avie Echevaria in Treatment: 10 Vital Signs Time Taken: 10:38 Temperature (F): 97.6 Weight (lbs):  270 Pulse (bpm): 108 Respiratory Rate (breaths/min): 18 Blood Pressure (mmHg): 131/95 Capillary Blood Glucose (mg/dl): 353 Reference Range: 80 - 120 mg / dl Electronic Signature(s) Signed: 08/13/2021 1:46:06 PM By: Haywood Pao CHT EMT BS , , Entered By: Haywood Pao on 08/13/2021 13:46:06

## 2021-08-17 ENCOUNTER — Other Ambulatory Visit: Payer: Self-pay

## 2021-08-17 ENCOUNTER — Encounter (HOSPITAL_BASED_OUTPATIENT_CLINIC_OR_DEPARTMENT_OTHER): Payer: Medicare Other | Admitting: Internal Medicine

## 2021-08-17 ENCOUNTER — Encounter (HOSPITAL_BASED_OUTPATIENT_CLINIC_OR_DEPARTMENT_OTHER): Payer: Self-pay

## 2021-08-18 ENCOUNTER — Encounter (HOSPITAL_BASED_OUTPATIENT_CLINIC_OR_DEPARTMENT_OTHER): Payer: Medicare Other | Admitting: Internal Medicine

## 2021-08-18 ENCOUNTER — Encounter (HOSPITAL_BASED_OUTPATIENT_CLINIC_OR_DEPARTMENT_OTHER): Payer: Medicare Other | Attending: Internal Medicine | Admitting: Internal Medicine

## 2021-08-18 DIAGNOSIS — L97528 Non-pressure chronic ulcer of other part of left foot with other specified severity: Secondary | ICD-10-CM | POA: Diagnosis not present

## 2021-08-18 DIAGNOSIS — E11621 Type 2 diabetes mellitus with foot ulcer: Secondary | ICD-10-CM | POA: Insufficient documentation

## 2021-08-18 DIAGNOSIS — Z87891 Personal history of nicotine dependence: Secondary | ICD-10-CM | POA: Insufficient documentation

## 2021-08-18 DIAGNOSIS — M86372 Chronic multifocal osteomyelitis, left ankle and foot: Secondary | ICD-10-CM | POA: Diagnosis not present

## 2021-08-18 DIAGNOSIS — M14672 Charcot's joint, left ankle and foot: Secondary | ICD-10-CM | POA: Diagnosis not present

## 2021-08-18 LAB — GLUCOSE, CAPILLARY
Glucose-Capillary: 163 mg/dL — ABNORMAL HIGH (ref 70–99)
Glucose-Capillary: 176 mg/dL — ABNORMAL HIGH (ref 70–99)

## 2021-08-18 NOTE — Progress Notes (Signed)
RYLEND, PIETRZAK ([id number]) Visit Report for 08/13/2021 SuperBill Details Patient Name: Date of Service: [name], [name] 08/13/2021 Medical Record Number: [id number] Patient Account Number: [id number] Date of Birth/Sex: Treating RN: January 23, 1961 (60 y.o. [name] Primary Care Provider: [name] Other Clinician: Haywood Pao Referring Provider: Treating Provider/Extender: Avie Echevaria in Treatment: 10 Diagnosis Coding ICD-10 Codes Code Description [phone] Type 2 diabetes mellitus with foot ulcer M14.672 Charcot's joint, left ankle and foot L97.528 Non-pressure chronic ulcer of other part of left foot with other specified severity M86.372 Chronic multifocal osteomyelitis, left ankle and foot Facility Procedures CPT4 Code Description Modifier Quantity [id number] G0277-(Facility Use Only) HBOT full body chamber, , 4 ICD-10 Diagnosis Description E11.621 Type 2 diabetes mellitus with foot ulcer M14.672 Charcot's joint, left ankle and foot L97.528 Non-pressure chronic ulcer of other part of left foot with other specified severity M86.372 Chronic multifocal osteomyelitis, left ankle and foot Physician Procedures Quantity CPT4 Code Description Modifier [id number] 99183 - WC PHYS HYPERBARIC OXYGEN THERAPY 1 ICD-10 Diagnosis Description E11.621 Type 2 diabetes mellitus with foot ulcer M14.672 Charcot's joint, left ankle and foot L97.528 Non-pressure chronic ulcer of other part of left foot with other specified severity M86.372 Chronic multifocal osteomyelitis, left ankle and foot Electronic Signature(s) Signed: 08/13/2021 1:49:55 PM By: Haywood Pao CHT EMT BS , , Signed: 08/18/2021 4:29:54 PM By: Baltazar Najjar MD Entered By: Haywood Pao on 08/13/2021 13:49:54

## 2021-08-18 NOTE — Progress Notes (Addendum)
ETHIN, DRUMMOND ([id number]) Visit Report for 08/18/2021 HBO Details Patient Name: Date of Service: [name], [name] 08/18/2021 10:00 A M Medical Record Number: [id number] Patient Account Number: [id number] Date of Birth/Sex: Treating RN: 1961-05-17 (60 y.o. Charlean Merl, Lauren Primary Care Jacksen Isip: [name] Other Clinician: Haywood Pao Referring Chynna Buerkle: Treating Christene Pounds/Extender: Avie Echevaria in Treatment: 10 HBO Treatment Course Details Treatment Course Number: 1 Ordering Myah Guynes: Baltazar Najjar T Treatments Ordered: otal 40 HBO Treatment Start Date: 07/07/2021 HBO Indication: Chronic Refractory Osteomyelitis to Left,Plantar Foot HBO Treatment Details Treatment Number: 23 Patient Type: Outpatient Chamber Type: Monoplace Chamber Serial #: [id number] Treatment Protocol: 2.5 ATA with 90 minutes oxygen, with two 5 minute air breaks Treatment Details Compression Rate Down: 2.0 psi / minute De-Compression Rate Up: 2.0 psi / minute A breaks and breathing ir Compress Tx Pressure periods Decompress Decompress Begins Reached (leave unused spaces Begins Ends blank) Chamber Pressure (ATA 1 2.5 2.5 2.5 2.5 2.5 - - 2.5 1 ) Clock Time (24 hr) 10:47 10:58 11:28 11:33 12:03 12:08 - - 12:38 12:49 Treatment Length: 122 (minutes) Treatment Segments: 4 Vital Signs Capillary Blood Glucose Reference Range: 80 - 120 mg / dl HBO Diabetic Blood Glucose Intervention Range: <131 mg/dl or >423 mg/dl Time Vitals Blood Respiratory Capillary Blood Glucose Pulse Action Type: Pulse: Temperature: Taken: Pressure: Rate: Glucose (mg/dl): Meter #: Oximetry (%) Taken: Pre 10:27 115/67 97 18 97.8 163 Post 12:58 119/65 90 18 97.6 176 Treatment Response Treatment Toleration: Well Treatment Completion Status: Treatment Completed without Adverse Event Marva Hendryx Notes No concerns with treatment given. The patient was also seen for wound care evaluation Physician HBO  Attestation: I certify that I supervised this HBO treatment in accordance with Medicare guidelines. A trained emergency response team is readily available per Yes hospital policies and procedures. Continue HBOT as ordered. Yes Electronic Signature(s) Signed: 08/18/2021 4:29:54 PM By: Baltazar Najjar MD Previous Signature: 08/18/2021 1:53:03 PM Version By: Haywood Pao CHT EMT BS , , Entered By: Baltazar Najjar on 08/18/2021 16:23:56 -------------------------------------------------------------------------------- HBO Safety Checklist Details Patient Name: Date of Service: [name] 08/18/2021 10:00 A M Medical Record Number: [id number] Patient Account Number: [id number] Date of Birth/Sex: Treating RN: 1961-02-12 (60 y.o. Charlean Merl, Lauren Primary Care Therasa Lorenzi: [name] Other Clinician: Haywood Pao Referring Soua Caltagirone: Treating Phoenyx Melka/Extender: Avie Echevaria in Treatment: 10 HBO Safety Checklist Items Safety Checklist Consent Form Signed Patient voided / foley secured and emptied When did you last eato snack this AM Last dose of injectable or oral agent n/a Ostomy pouch emptied and vented if applicable NA All implantable devices assessed, documented and approved NA Intravenous access site secured and place NA Valuables secured Linens and cotton and cotton/polyester blend (less than 51% polyester) Personal oil-based products / skin lotions / body lotions removed Wigs or hairpieces removed NA Smoking or tobacco materials removed NA Books / newspapers / magazines / loose paper removed Cologne, aftershave, perfume and deodorant removed Jewelry removed (may wrap wedding band) NA Make-up removed NA Hair care products removed Battery operated devices (external) removed Heating patches and chemical warmers removed Titanium eyewear removed Nail polish cured greater than 10 hours NA Casting material cured greater than 10  hours NA Hearing aids removed NA Loose dentures or partials removed NA Prosthetics have been removed NA Patient demonstrates correct use of air break device (if applicable) Patient concerns have been addressed Patient grounding bracelet on and cord attached to chamber Specifics for Inpatients (complete in addition to above) Medication  sheet sent with patient NA Intravenous medications needed or due during therapy sent with patient NA Drainage tubes (e.g. nasogastric tube or chest tube secured and vented) NA Endotracheal or Tracheotomy tube secured NA Cuff deflated of air and inflated with saline NA Airway suctioned NA Electronic Signature(s) Signed: 08/18/2021 1:47:54 PM By: Haywood Pao CHT EMT BS , , Entered By: Haywood Pao on 08/18/2021 13:47:53

## 2021-08-18 NOTE — Progress Notes (Signed)
[name], [name] ([id number]) Visit Report for 08/18/2021 HPI Details Patient Name: Date of Service: [name], [name] 08/18/2021 12:45 PM Medical Record Number: [id number] Patient Account Number: [id number] Date of Birth/Sex: Treating RN: 02-25-1961 (60 y.o. [name] Primary Care Provider: Billey Gosling Other Clinician: Referring Provider: Treating Provider/Extender: Genella Rife in Treatment: 10 History of Present Illness HPI Description: 10/31/2019 upon evaluation today patient appears to be doing somewhat poorly in regard to his bilateral plantar feet. He has wounds that he tells me have been present since 2012 intermittently off and on. Most recently this has been open for at least the past 6 months to a year. He has been trying to treat this in different ways using Santyl along with various other dressings including Medihoney and even at one point Xeroform. Nothing really has seem to get this completely closed. He was recently in the hospital for cellulitis of his leg subsequently he did have x-rays as well as MRIs that showed negative for any signs of osteomyelitis in regard to the wounds on his feet. Fortunately there is no signs of systemic infection at this time. No fevers, chills, nausea, vomiting, or diarrhea. Patient has previously used Darco offloading shoes as far as frontal floaters as well as postop surgical shoes. He has never been in a total contact cast that may be something we need to strongly consider here. Patient's most recent hemoglobin A1c 1 month ago was 5.3 seems to be very well controlled which is great. Subsequently he has seen vascular as well as podiatry. His ABIs are 1.07 on the left and 1.14 on the right he seems to be doing well he does have chronic venous stasis. 11/07/2019 upon evaluation today patient appears to be doing well with regard to his wounds all things considered. I do not see any severe worsening he still has some callus  buildup on the right more than the left he notes that he has been probably more active than he should as far as walking is concerned is just very hard to not be active. He knows he needs to be more careful in this regard however. He is willing to give the cast a try at this point although he notes that he is a little nervous about this just with regard to balance although he will be very careful and obviously if he has any trouble he knows to contact the office and let me know. 1/22; patient is in for his obligatory first total contact cast change. Our intake nurse reported a very large amount of drainage which is spelled out over to the surrounding skin. Has bilateral diabetic foot wounds. He has Charcot feet. We have been using silver alginate on his wounds. 11/14/2019 on evaluation today patient is actually seeming to make good improvement in regard to his bilateral plantar foot wounds. We have been using a cast on the left side and on the right side he has been using dressings he is changing up his own accord. With that being said he tells me that he is also not walking as much just due to how unsteady he feels. He takes it easy when he does have to walk and when he does not have to walk he is resting. This is probably help in his right foot as well has the left foot which is actually measuring better. In fact both are measuring better. Overall I am very pleased with how things seem to be progressing. The patient does have some  odor on the left foot this does have me concerned about the possibility of infection, and actually probably go ahead and put him on antibiotics today as well as utilizing a continuation of the cast on the left foot I think that will be fine we probably just need to bring him in sooner to change this not last a whole week. 1/29; we brought the patient back today for a total contact cast change on the left out of concern for excessive drainage. We are using drawtex over  the wound as the primary dressing 11/21/2019 on evaluation today patient appears to be doing well with regard to his left plantar foot. In fact both foot ulcers actually seem to be doing pretty well. Nonetheless he is having a lot of drainage on the left at this time and again we did obtain a wound culture did show positive for Staph aureus that was reviewed by myself today as well. Nonetheless he is on Bactrim which was shown to be sensitive that should be helping in this regard. Fortunately there is no signs of infection systemically at this point. 2/5; back in clinic today for a total contact cast change apparently secondary to very significant drainage. Still using drawtex 11/28/2019 upon evaluation today patient appears to be doing well with regard to his wounds. The right foot is doing okay as measured about the same in my opinion. The left foot is actually showing signs of significant improvement is measuring smaller there is a lot of hyper granulation likely due to the continued drainage at this point. We did obtain approval for a snap VAC I think that is good to be appropriate for him and will likely help this tremendously underneath the cast. He is definitely in agreement with proceeding with such. 2/12; patient came in today after his snap VAC lost suction. Brought in to see one of our nurses. The dressing was replaced and then we put the cast back on and rehooked up the snap VAC. Apparently his wound looked very good per our intake nurse. 2/15; again we replaced the cast on Friday. By Saturday the snap VAC and light suction. He called this morning he comes in acutely. The wounds look fine however the VAC is not functioning. We replaced the cast using silver alginate as the primary dressing backed with Kerramax. The snap VAC was not replaced 12/05/2019 upon evaluation today patient appears to be doing better in regard to his left plantar foot ulcer. Fortunately there is no signs of active  infection at this time. Unfortunately he is continuing to have issues with the right foot he is really not making any progress here things seem to be somewhat stagnant to be honest. The depth has increased but that is due to me having debrided the wound in the past based on what I am seeing. 12/12/2019 on evaluation today patient appears to be doing more poorly in regard to the left lower extremity. He has some erythema spreading up the side of his foot I am concerned about infection again at this point. Unfortunately he has been seeing improvement with a total contact cast but I do not think we should put that on today. On his right plantar foot he continues to have significant drainage this is actually measuring deeper I really do not feel like you are making any progress whatsoever. I have prescribed Granix for him unfortunately his insurance apparently was going to cost him a $500 co-pay. 12/19/2019 upon evaluation today patient actually appears to  be doing better in regard to both wounds. With that being said he actually did get the reGranix which he had to pay $500 for. With that being said it does look like that he is actually made some improvement based on what I am seeing at this point with the reGranix. Obviously if he is going to continue this we are going to do something about trying to get him some help in covering the cost. 12/26/2019 on evaluation today patient appears to be doing really much better even compared to last week. Overall the wound seems to be much better even compared to last week and last week was better than the week before. Since has been using the reGranix his symptoms have improved significantly. With that being said the issue right now is simply that this is a very expensive medication for him the first dose cost him $500. Upon inspection patient's wound bed actually is however dramatically improved compared to before he started this 2 weeks ago. 01/02/2020 upon evaluation  today patient appears to actually be doing well. He still had a little bit of reGranix left that has been using in small amounts he just been applying it every other day instead of every day in order to make it go longer. Overall we are still seeing excellent improvement he is measuring smaller looking better healthier tissue and everything seems to be pointing to this headed in the right direction. Fortunately there is no evidence of infection either which is also excellent news. He does have his MRI coming up within the next week. 01/09/2020 upon evaluation today patient appears to be doing a little worse today compared to previous week's evaluation. He is actually been out of the reGranix at this point. He has been trying to make the stretch out so he has been changing the dressings on a regular set schedule like he was previous. I think this has made a difference. Fortunately there is no signs of active infection at this time. No fevers, chills, nausea, vomiting, or diarrhea. 01/16/2020 upon evaluation today patient appears to be doing well with regard to his left plantar foot ulcer. The right plantar foot still shows some significant depth at this point. Fortunately there is no signs of active infection at this time. 01/30/2020 upon evaluation today patient appears to be doing about the same in regard to his right plantar foot ulcer there is still some depth here and we had to wait till he actually switched over to his new insurance to get approval for the MRI under his new insurance plan. With that being said he now has switched as of April 1. Fortunately there is no signs of active infection at this time. Overall in regard to his left foot ulcer this seems to be doing much better and I am actually very pleased with how things are going. With that being said it is not quite as much progress as we were seeing with the reGranix but at the same time he has had trouble getting this apparently there is  been some hindrance here. I Georgina Peer try to actually send this to [facility] that was recommended by the drug rep to me. 02/13/20 upon evaluation today patient appears to be doing better in regard to his left foot ulcer this is great news. Unfortunately the right foot ulcer is not really significantly better at this time. There is no signs of active infection systemically though he did have his MRI which showed unfortunately he does have  infection noted including an abscess in the foot. There is also marrow changes noted which are consistent with osteomyelitis based on the radiology review and interpretation. Unfortunately considering that the wound is not really making the progress that we will he would like to be seen I think that this is an indication that he may need some further referral both infectious disease as well as potentially to podiatrist to see if there is anything that can be done to help with the situation that were dealing with here. The left foot again is doing great. READMISSION 06/04/2021 This is a 60 year old man who was in the clinic in 2021 followed by Jeri Cos for areas on the right and left foot. He developed a left foot infection and was referred to ID. He left the clinic in a nonhealed state and was followed for a period of [facility] Cannon Kettle. Apparently things really deteriorated in early July when he was admitted to hospital from 04/27/2021 through 05/06/2021 with sepsis secondary to a left foot infection. His blood cultures were negative. An MRI suggested fifth metatarsal osteomyelitis a left ankle septic joint. He was treated with vancomycin and ertapenem which he is still taking and may just about be finishing. He was seen by orthopedics and the patient adamantly refused to BKA. As far as I can tell he did not have the ankle aspirated I am not exactly sure what the issue was here. Since he has been discharged she is at [facility] for  rehabilitation. He was last seen by Dr. Drucilla Schmidt on 05/20/2021 he noted osteo of the tibial talar bone cuboid and fifth metatarsal which is even more extensive than what was suggested by the MRI. He is apparently going for a consultation with orthopedic surgery in [place] sometime next week. I received a call about this man 2 weeks ago from Dr. Gwenlyn Found who follows him for the possibility of PAD. ABIs I think done in the office showed a ABI on the right of 1.05 at the PTA and 0.99 at the PTA on the left. He had a DVT rule out in the left leg that was negative for the DVT. Past medical history is extensive and includes diastolic heart failure, right first metatarsal head ulcer in 2021, excision of the right second ray by Dr. Cannon Kettle on 03/13/2020, hypertension, hypothyroidism. Left total hip replacement, right total knee replacement, carpal tunnel syndrome, obstructive sleep apnea alcohol abuse with cirrhosis although the patient denies current alcohol intake. The patient does not think he is a diabetic however looking through [place] link I see 2 HgbAic's of this year that were greater than equal to 6.5 which by definition makes him diabetic. Nevertheless he is not on any treatment and does not check his blood sugars. The patient is now back home out of the nursing home. Saw Dr. Drucilla Schmidt last week he was taken off vancomycin and ertapenem on August 22 and now is on doxycycline on Augmentin. He also saw Dr. Buford Dresser who is his orthopedic surgeon in [place] he recommended a [employer]. He has been using Medihoney. 9/6I have been having trouble getting hyperbarics approved through our prior authorization process. Even though he had a limb threatening infection in the left foot and probably the left ankle there glitches in how some of the reports are worded also some of the consultants. In any case I am going to repeat his sedimentation rate and C-reactive protein. I am generally not in favor of doing  things  like this as they really do not alter the plan of care from my point of view however I am going to need to demonstrate that these remain high in order to get this through forhyperbaric treatment for chronic refractory osteomyelitis 9/13; following this man for a wound on his left plantar foot in the setting of type 2 diabetes and Charcot deformity. He has underlying chronic refractory osteomyelitis. Follow-up sedimentation rate and C-reactive protein were both elevated but the C-reactive protein was down to 1.4, sedimentation rate at 70. Sedimentation rate was only slightly down from previous at 85. His wound is measuring slightly smaller. 9/20; patient started hyperbaric oxygen therapy today and tolerated treatment well. This is for the underlying osteomyelitis. He remains on antibiotics but thinks he is getting close to finishing. The wound on the plantar aspect of his foot is the other issue we are following here. He is using Medihoney The patient has a Charcot foot in the setting of type 2 diabetes. He is going to need a total contact cast although his partner was away this week and we elected to delay this till next week 9/27; patient still tolerating hyperbaric oxygen well. Wound looked generally healthy not much depth under illumination still 100% covered in fibrinous debris. Raised callused edges around the wound he was prepared for a total contact cast. We have been using Medihoney 9/30; patient is back for his first obligatory total contact cast change. We are using Hydrofera Blue. Noted by our intake nurse to have a lot of drainage or at least a moderate amount of drainage. I am not sure I was previously aware of this 10/4; patient arrives today with a lot of drainage under the cast. When he had it changed last Friday there was also a similar amount of drainage. Her intake nurse says that they tried a wound VAC on him perhaps while I was on vacation in August under the cast but that did not  work. In my experience that has not been unusual. We have been using Hydrofera Blue with all the secondary absorbers. The drainage today went right through all of our dressings. The patient is concerned about his foot being in a cast without much drainage. He is tolerating HBO well. There has been improvements in the wound in the mid part of his foot in the setting of a Charcot deformity 10/7; patient presents for cast change. He has no issues or complaints today. He denies signs of infection. 10/11; patient presents for cast change. At this time he would like to take a break from the cast. He would like to do daily dressing changes with Hydrofera Blue. He currently denies signs of infection. 10/14; his cast was taken off last week at his request. He arrives in the clinic with [facility]. He has been changing the dressing himself. He has way too much edema in the left foot and leg to consider a total contact cast. I do not really know the issue here. He does have chronic venous insufficiency His wife stopped me in the clinic earlier in the week to report he is drinking again and she is concerned. I am uncertain whether there are other issues 10/18; he arrived in clinic last week having bilateral lower extremity edema likely secondary to chronic venous insufficiency there was too much edema in the left leg to apply a total contact cast. I put him in compression on the left leg to control the swelling. This week he cut the  wrap on the foot for reasons that are unclear however today he arrives in clinic with a smaller left leg but massive edema in the left foot. He is supposed to be wearing a juxta lite stocking on the right leg but he is not wearing the contact layer. His attendance at hyperbaric oxygen has been dwindling, he did not dive yesterday and he did not dive today concerned about hyperbarics causing swelling. I looked back in his record his last echo was in 2020 essentially normal left and  right ventricular function. Last BUN and creatinine were done in July this was normal. He is having a lot of drainage in the left plantar foot wound 10/25; again he comes in today having missed HBO yesterday. Macerated skin around the wound which really does not look very good at all. A lot of edema in the left foot but an improvement in edema on the left leg. We wrapped him last week because of the amount of swelling in the left leg. We could not apply a total contact cast. The patient states that he wants to be able to change his dressing himself. I might consider this if he had stockings to control the swelling. He said he be here for HBO tomorrow. I will check the degree of erythema in his forefoot which I have marked. He is on doxycycline and Augmentin which was renewed by Dr. Drucilla Schmidt but I cannot see a follow-up note, follow-up inflammatory markers etc. I 1 point he said he was going to see his orthopedic surgeon in [place] I am not sure if he ever did this. I do not know why he has not followed up with infectious disease, he says he was not given an appointment but he is still on Augmentin doxycycline 11/1; he did not do the lab work I ordered last week. Still on Augmentin and doxycycline. Silver alginate and he is changing this daily using his own juxta lite stocking. The surface of the wound does not look too bad. No real epithelialization however. The patient was seen today along with HBO Electronic Signature(s) Signed: 08/18/2021 4:29:54 PM By: Linton Ham MD Entered By: Linton Ham on 08/18/2021 16:26:00 -------------------------------------------------------------------------------- Physical Exam Details Patient Name: Date of Service: INDRA, OKELLEY IG 08/18/2021 12:45 PM Medical Record Number: [id number] Patient Account Number: [id number] Date of Birth/Sex: Treating RN: 1961-05-26 (60 y.o. [name] Primary Care Provider: Billey Gosling Other Clinician: Referring  Provider: Treating Provider/Extender: Genella Rife in Treatment: 10 Constitutional Sitting or standing Blood Pressure is within target range for patient.. Pulse regular and within target range for patient.Marland Kitchen Respirations regular, non-labored and within target range.. Temperature is normal and within the target range for the patient.Marland Kitchen Appears in no distress. Notes Wound exam; the patient's wound does not look as bad as last time. Some macerated loose tissue around the circumference but no debridement was required. Under illumination the surface of the wound looks satisfactory but there is no epithelialization. We use MolecuLight to look at the surface no identifiable surface bacteria Electronic Signature(s) Signed: 08/18/2021 4:29:54 PM By: Linton Ham MD Entered By: Linton Ham on 08/18/2021 14:16:07 -------------------------------------------------------------------------------- Physician Orders Details Patient Name: Date of Service: GID, BADIA IG 08/18/2021 12:45 PM Medical Record Number: [id number] Patient Account Number: [id number] Date of Birth/Sex: Treating RN: 10-Aug-1961 (60 y.o. [name] Primary Care Provider: Billey Gosling Other Clinician: Referring Provider: Treating Provider/Extender: Genella Rife in Treatment: 10 Verbal / Phone Orders: No Diagnosis Coding Follow-up  Appointments ppointment in 1 week. - Dr. Dellia Nims!!! Return A Bathing/ Shower/ Hygiene May shower and wash wound with soap and water. - prior to dressing change Edema Control - Lymphedema / SCD / Other Elevate legs to the level of the heart or above for 30 minutes daily and/or when sitting, a frequency of: - throughout the day Avoid standing for long periods of time. Exercise regularly Moisturize legs daily. Compression stocking or Garment 30-40 mm/Hg pressure to: - wear the juxtalite HD to right and left leg. apply in the morning and remove  at night. Off-Loading Total Contact Cast to Left Lower Extremity - Size 4 Hold due to swelling. Additional Orders / Instructions Follow Nutritious Diet Hyperbaric Oxygen Therapy Wound #3 Left,Plantar Foot Evaluate for HBO Therapy - Starts 07/07/21 Indication: - Chronic Refractory Osteomyelitis 2.5 ATA for 90 Minutes with 2 Five (5) Minute A Breaks ir Total Number of Treatments: - 40 One treatments per day (delivered Monday through Friday unless otherwise specified in Special Instructions below): Finger stick Blood Glucose Pre- and Post- HBOT Treatment. Follow Hyperbaric Oxygen Glycemia Protocol Afrin (Oxymetazoline HCL) 0.05% nasal spray - 1 spray in both nostrils daily as needed prior to HBO treatment for difficulty clearing ears Wound Treatment Wound #3 - Foot Wound Laterality: Plantar, Left Cleanser: Soap and Water 1 x Per Day/15 Days Discharge Instructions: May shower and wash wound with dial antibacterial soap and water prior to dressing change. Cleanser: Wound Cleanser (Generic) 1 x Per Day/15 Days Discharge Instructions: Cleanse the wound with wound cleanser prior to applying a clean dressing using gauze sponges, not tissue or cotton balls. Peri-Wound Care: Zinc Oxide Ointment 30g tube 1 x Per Day/15 Days Discharge Instructions: Apply Zinc Oxide to periwound with each dressing change Prim Dressing: KerraCel Ag Gelling Fiber Dressing, 4x5 in (silver alginate) (Generic) 1 x Per Day/15 Days ary Discharge Instructions: Apply silver alginate to wound bed as instructed Secondary Dressing: ABD Pad, 5x9 (Generic) 1 x Per Day/15 Days Discharge Instructions: Apply over primary dressing as directed. Secondary Dressing: Zetuvit Plus 4x8 in (Generic) 1 x Per Day/15 Days Discharge Instructions: Apply over primary dressing as directed. Secured With: [employer], 4.5x3.1 (in/yd) (Generic) 1 x Per Day/15 Days Discharge Instructions: Secure with Kerlix as directed. Secured With: [id number]  Medipore H Soft Cloth Surgical Tape, 2x2 (in/yd) (Generic) 1 x Per Day/15 Days Discharge Instructions: Secure dressing with tape as directed. Compression Stockings: Circaid Juxta Lite Compression Wrap Left Leg Compression Amount: 30-40 mmHG Discharge Instructions: Apply Circaid Juxta Lite Compression Wrap daily as instructed. Apply first thing in the morning, remove at night before bed. GLYCEMIA INTERVENTIONS PROTOCOL PRE-HBO GLYCEMIA INTERVENTIONS ACTION INTERVENTION Obtain pre-HBO capillary blood glucose (ensure 1 physician order is in chart). A. Notify HBO physician and await physician orders. 2 If result is 70 mg/dl or below: B. If the result meets the hospital definition of a critical result, follow hospital policy. A. Give patient an 8 ounce Glucerna Shake, an 8 ounce Ensure, or 8 ounces of a Glucerna/Ensure equivalent dietary supplement*. B. Wait 30 minutes. If result is 71 mg/dl to 130 mg/dl: C. Retest patients capillary blood glucose (CBG). D. If result greater than or equal to 110 mg/dl, proceed with HBO. If result less than 110 mg/dl, notify HBO physician and consider holding HBO. If result is 131 mg/dl to 249 mg/dl: A. Proceed with HBO. A. Notify HBO physician and await physician orders. B. It is recommended to hold HBO and do If result is 250 mg/dl or  greater: blood/urine ketone testing. C. If the result meets the hospital definition of a critical result, follow hospital policy. POST-HBO GLYCEMIA INTERVENTIONS ACTION INTERVENTION Obtain post HBO capillary blood glucose (ensure 1 physician order is in chart). A. Notify HBO physician and await physician orders. 2 If result is 70 mg/dl or below: B. If the result meets the hospital definition of a critical result, follow hospital policy. A. Give patient an 8 ounce Glucerna Shake, an 8 ounce Ensure, or 8 ounces of a Glucerna/Ensure equivalent dietary supplement*. B. Wait 15 minutes for symptoms of If  result is 71 mg/dl to 100 mg/dl: hypoglycemia (i.e. nervousness, anxiety, sweating, chills, clamminess, irritability, confusion, tachycardia or dizziness). C. If patient asymptomatic, discharge patient. If patient symptomatic, repeat capillary blood glucose (CBG) and notify HBO physician. If result is 101 mg/dl to 249 mg/dl: A. Discharge patient. A. Notify HBO physician and await physician orders. B. It is recommended to do blood/urine ketone If result is 250 mg/dl or greater: testing. C. If the result meets the hospital definition of a critical result, follow hospital policy. *Juice or candies are NOT equivalent products. If patient refuses the Glucerna or Ensure, please consult the hospital dietitian for an appropriate substitute. Electronic Signature(s) Signed: 08/18/2021 4:29:54 PM By: Linton Ham MD Signed: 08/18/2021 4:48:23 PM By: Rhae Hammock RN Entered By: Rhae Hammock on 08/18/2021 13:39:53 -------------------------------------------------------------------------------- Problem List Details Patient Name: Date of Service: WELDON, ERLINGER IG 08/18/2021 12:45 PM Medical Record Number: [id number] Patient Account Number: [id number] Date of Birth/Sex: Treating RN: 1961-08-19 (60 y.o. [name] Primary Care Provider: Billey Gosling Other Clinician: Referring Provider: Treating Provider/Extender: Genella Rife in Treatment: 10 Active Problems ICD-10 Encounter Code Description Active Date MDM Diagnosis E11.621 Type 2 diabetes mellitus with foot ulcer 06/04/2021 No Yes M14.672 Charcot's joint, left ankle and foot 06/04/2021 No Yes L97.528 Non-pressure chronic ulcer of other part of left foot with other specified 06/04/2021 No Yes severity M86.372 Chronic multifocal osteomyelitis, left ankle and foot 06/04/2021 No Yes Inactive Problems Resolved Problems Electronic Signature(s) Signed: 08/18/2021 4:29:54 PM By: Linton Ham MD Entered  By: Linton Ham on 08/18/2021 14:13:36 -------------------------------------------------------------------------------- Progress Note Details Patient Name: Date of Service: [name] 08/18/2021 12:45 PM Medical Record Number: [id number] Patient Account Number: [id number] Date of Birth/Sex: Treating RN: 1960-10-27 (60 y.o. [name] Primary Care Provider: Billey Gosling Other Clinician: Referring Provider: Treating Provider/Extender: Genella Rife in Treatment: 10 Subjective History of Present Illness (HPI) 10/31/2019 upon evaluation today patient appears to be doing somewhat poorly in regard to his bilateral plantar feet. He has wounds that he tells me have been present since 2012 intermittently off and on. Most recently this has been open for at least the past 6 months to a year. He has been trying to treat this in different ways using Santyl along with various other dressings including Medihoney and even at one point Xeroform. Nothing really has seem to get this completely closed. He was recently in the hospital for cellulitis of his leg subsequently he did have x-rays as well as MRIs that showed negative for any signs of osteomyelitis in regard to the wounds on his feet. Fortunately there is no signs of systemic infection at this time. No fevers, chills, nausea, vomiting, or diarrhea. Patient has previously used Darco offloading shoes as far as frontal floaters as well as postop surgical shoes. He has never been in a total contact cast that may be something we need to strongly consider  here. Patient's most recent hemoglobin A1c 1 month ago was 5.3 seems to be very well controlled which is great. Subsequently he has seen vascular as well as podiatry. His ABIs are 1.07 on the left and 1.14 on the right he seems to be doing well he does have chronic venous stasis. 11/07/2019 upon evaluation today patient appears to be doing well with regard to his wounds all  things considered. I do not see any severe worsening he still has some callus buildup on the right more than the left he notes that he has been probably more active than he should as far as walking is concerned is just very hard to not be active. He knows he needs to be more careful in this regard however. He is willing to give the cast a try at this point although he notes that he is a little nervous about this just with regard to balance although he will be very careful and obviously if he has any trouble he knows to contact the office and let me know. 1/22; patient is in for his obligatory first total contact cast change. Our intake nurse reported a very large amount of drainage which is spelled out over to the surrounding skin. Has bilateral diabetic foot wounds. He has Charcot feet. We have been using silver alginate on his wounds. 11/14/2019 on evaluation today patient is actually seeming to make good improvement in regard to his bilateral plantar foot wounds. We have been using a cast on the left side and on the right side he has been using dressings he is changing up his own accord. With that being said he tells me that he is also not walking as much just due to how unsteady he feels. He takes it easy when he does have to walk and when he does not have to walk he is resting. This is probably help in his right foot as well has the left foot which is actually measuring better. In fact both are measuring better. Overall I am very pleased with how things seem to be progressing. The patient does have some odor on the left foot this does have me concerned about the possibility of infection, and actually probably go ahead and put him on antibiotics today as well as utilizing a continuation of the cast on the left foot I think that will be fine we probably just need to bring him in sooner to change this not last a whole week. 1/29; we brought the patient back today for a total contact cast change on the  left out of concern for excessive drainage. We are using drawtex over the wound as the primary dressing 11/21/2019 on evaluation today patient appears to be doing well with regard to his left plantar foot. In fact both foot ulcers actually seem to be doing pretty well. Nonetheless he is having a lot of drainage on the left at this time and again we did obtain a wound culture did show positive for Staph aureus that was reviewed by myself today as well. Nonetheless he is on Bactrim which was shown to be sensitive that should be helping in this regard. Fortunately there is no signs of infection systemically at this point. 2/5; back in clinic today for a total contact cast change apparently secondary to very significant drainage. Still using drawtex 11/28/2019 upon evaluation today patient appears to be doing well with regard to his wounds. The right foot is doing okay as measured about the same in my  opinion. The left foot is actually showing signs of significant improvement is measuring smaller there is a lot of hyper granulation likely due to the continued drainage at this point. We did obtain approval for a snap VAC I think that is good to be appropriate for him and will likely help this tremendously underneath the cast. He is definitely in agreement with proceeding with such. 2/12; patient came in today after his snap VAC lost suction. Brought in to see one of our nurses. The dressing was replaced and then we put the cast back on and rehooked up the snap VAC. Apparently his wound looked very good per our intake nurse. 2/15; again we replaced the cast on Friday. By Saturday the snap VAC and light suction. He called this morning he comes in acutely. The wounds look fine however the VAC is not functioning. We replaced the cast using silver alginate as the primary dressing backed with Kerramax. The snap VAC was not replaced 12/05/2019 upon evaluation today patient appears to be doing better in regard to his  left plantar foot ulcer. Fortunately there is no signs of active infection at this time. Unfortunately he is continuing to have issues with the right foot he is really not making any progress here things seem to be somewhat stagnant to be honest. The depth has increased but that is due to me having debrided the wound in the past based on what I am seeing. 12/12/2019 on evaluation today patient appears to be doing more poorly in regard to the left lower extremity. He has some erythema spreading up the side of his foot I am concerned about infection again at this point. Unfortunately he has been seeing improvement with a total contact cast but I do not think we should put that on today. On his right plantar foot he continues to have significant drainage this is actually measuring deeper I really do not feel like you are making any progress whatsoever. I have prescribed Granix for him unfortunately his insurance apparently was going to cost him a $500 co-pay. 12/19/2019 upon evaluation today patient actually appears to be doing better in regard to both wounds. With that being said he actually did get the reGranix which he had to pay $500 for. With that being said it does look like that he is actually made some improvement based on what I am seeing at this point with the reGranix. Obviously if he is going to continue this we are going to do something about trying to get him some help in covering the cost. 12/26/2019 on evaluation today patient appears to be doing really much better even compared to last week. Overall the wound seems to be much better even compared to last week and last week was better than the week before. Since has been using the reGranix his symptoms have improved significantly. With that being said the issue right now is simply that this is a very expensive medication for him the first dose cost him $500. Upon inspection patient's wound bed actually is however dramatically improved compared  to before he started this 2 weeks ago. 01/02/2020 upon evaluation today patient appears to actually be doing well. He still had a little bit of reGranix left that has been using in small amounts he just been applying it every other day instead of every day in order to make it go longer. Overall we are still seeing excellent improvement he is measuring smaller looking better healthier tissue and everything seems to be  pointing to this headed in the right direction. Fortunately there is no evidence of infection either which is also excellent news. He does have his MRI coming up within the next week. 01/09/2020 upon evaluation today patient appears to be doing a little worse today compared to previous week's evaluation. He is actually been out of the reGranix at this point. He has been trying to make the stretch out so he has been changing the dressings on a regular set schedule like he was previous. I think this has made a difference. Fortunately there is no signs of active infection at this time. No fevers, chills, nausea, vomiting, or diarrhea. 01/16/2020 upon evaluation today patient appears to be doing well with regard to his left plantar foot ulcer. The right plantar foot still shows some significant depth at this point. Fortunately there is no signs of active infection at this time. 01/30/2020 upon evaluation today patient appears to be doing about the same in regard to his right plantar foot ulcer there is still some depth here and we had to wait till he actually switched over to his new insurance to get approval for the MRI under his new insurance plan. With that being said he now has switched as of April 1. Fortunately there is no signs of active infection at this time. Overall in regard to his left foot ulcer this seems to be doing much better and I am actually very pleased with how things are going. With that being said it is not quite as much progress as we were seeing with the reGranix but at the  same time he has had trouble getting this apparently there is been some hindrance here. I Ernie Hew try to actually send this to [facility] that was recommended by the drug rep to me. 02/13/20 upon evaluation today patient appears to be doing better in regard to his left foot ulcer this is great news. Unfortunately the right foot ulcer is not really significantly better at this time. There is no signs of active infection systemically though he did have his MRI which showed unfortunately he does have infection noted including an abscess in the foot. There is also marrow changes noted which are consistent with osteomyelitis based on the radiology review and interpretation. Unfortunately considering that the wound is not really making the progress that we will he would like to be seen I think that this is an indication that he may need some further referral both infectious disease as well as potentially to podiatrist to see if there is anything that can be done to help with the situation that were dealing with here. The left foot again is doing great. READMISSION 06/04/2021 This is a 59 year old man who was in the clinic in 2021 followed by Allen Derry for areas on the right and left foot. He developed a left foot infection and was referred to ID. He left the clinic in a nonhealed state and was followed for a period of [facility] Marylene Land. Apparently things really deteriorated in early July when he was admitted to hospital from 04/27/2021 through 05/06/2021 with sepsis secondary to a left foot infection. His blood cultures were negative. An MRI suggested fifth metatarsal osteomyelitis a left ankle septic joint. He was treated with vancomycin and ertapenem which he is still taking and may just about be finishing. He was seen by orthopedics and the patient adamantly refused to BKA. As far as I can tell he did not have the ankle  aspirated I am not exactly sure what the issue was here.  Since he has been discharged she is at [facility] for rehabilitation. He was last seen by Dr. Drucilla Schmidt on 05/20/2021 he noted osteo of the tibial talar bone cuboid and fifth metatarsal which is even more extensive than what was suggested by the MRI. He is apparently going for a consultation with orthopedic surgery in [place] sometime next week. I received a call about this man 2 weeks ago from Dr. Gwenlyn Found who follows him for the possibility of PAD. ABIs I think done in the office showed a ABI on the right of 1.05 at the PTA and 0.99 at the PTA on the left. He had a DVT rule out in the left leg that was negative for the DVT. Past medical history is extensive and includes diastolic heart failure, right first metatarsal head ulcer in 2021, excision of the right second ray by Dr. Cannon Kettle on 03/13/2020, hypertension, hypothyroidism. Left total hip replacement, right total knee replacement, carpal tunnel syndrome, obstructive sleep apnea alcohol abuse with cirrhosis although the patient denies current alcohol intake. The patient does not think he is a diabetic however looking through [place] link I see 2 HgbAic's of this year that were greater than equal to 6.5 which by definition makes him diabetic. Nevertheless he is not on any treatment and does not check his blood sugars. The patient is now back home out of the nursing home. Saw Dr. Drucilla Schmidt last week he was taken off vancomycin and ertapenem on August 22 and now is on doxycycline on Augmentin. He also saw Dr. Buford Dresser who is his orthopedic surgeon in [place] he recommended a [employer]. He has been using Medihoney. 9/6I have been having trouble getting hyperbarics approved through our prior authorization process. Even though he had a limb threatening infection in the left foot and probably the left ankle there glitches in how some of the reports are worded also some of the consultants. In any case I am going to repeat his sedimentation rate and  C-reactive protein. I am generally not in favor of doing things like this as they really do not alter the plan of care from my point of view however I am going to need to demonstrate that these remain high in order to get this through forhyperbaric treatment for chronic refractory osteomyelitis 9/13; following this man for a wound on his left plantar foot in the setting of type 2 diabetes and Charcot deformity. He has underlying chronic refractory osteomyelitis. Follow-up sedimentation rate and C-reactive protein were both elevated but the C-reactive protein was down to 1.4, sedimentation rate at 70. Sedimentation rate was only slightly down from previous at 85. His wound is measuring slightly smaller. 9/20; patient started hyperbaric oxygen therapy today and tolerated treatment well. This is for the underlying osteomyelitis. He remains on antibiotics but thinks he is getting close to finishing. The wound on the plantar aspect of his foot is the other issue we are following here. He is using Medihoney The patient has a Charcot foot in the setting of type 2 diabetes. He is going to need a total contact cast although his partner was away this week and we elected to delay this till next week 9/27; patient still tolerating hyperbaric oxygen well. Wound looked generally healthy not much depth under illumination still 100% covered in fibrinous debris. Raised callused edges around the wound he was prepared for a total contact cast. We have been using Medihoney 9/30;  patient is back for his first obligatory total contact cast change. We are using Hydrofera Blue. Noted by our intake nurse to have a lot of drainage or at least a moderate amount of drainage. I am not sure I was previously aware of this 10/4; patient arrives today with a lot of drainage under the cast. When he had it changed last Friday there was also a similar amount of drainage. Her intake nurse says that they tried a wound VAC on him perhaps  while I was on vacation in August under the cast but that did not work. In my experience that has not been unusual. We have been using Hydrofera Blue with all the secondary absorbers. The drainage today went right through all of our dressings. The patient is concerned about his foot being in a cast without much drainage. He is tolerating HBO well. There has been improvements in the wound in the mid part of his foot in the setting of a Charcot deformity 10/7; patient presents for cast change. He has no issues or complaints today. He denies signs of infection. 10/11; patient presents for cast change. At this time he would like to take a break from the cast. He would like to do daily dressing changes with Hydrofera Blue. He currently denies signs of infection. 10/14; his cast was taken off last week at his request. He arrives in the clinic with [facility]. He has been changing the dressing himself. He has way too much edema in the left foot and leg to consider a total contact cast. I do not really know the issue here. He does have chronic venous insufficiency His wife stopped me in the clinic earlier in the week to report he is drinking again and she is concerned. I am uncertain whether there are other issues 10/18; he arrived in clinic last week having bilateral lower extremity edema likely secondary to chronic venous insufficiency there was too much edema in the left leg to apply a total contact cast. I put him in compression on the left leg to control the swelling. This week he cut the wrap on the foot for reasons that are unclear however today he arrives in clinic with a smaller left leg but massive edema in the left foot. He is supposed to be wearing a juxta lite stocking on the right leg but he is not wearing the contact layer. His attendance at hyperbaric oxygen has been dwindling, he did not dive yesterday and he did not dive today concerned about hyperbarics causing swelling. I looked back  in his record his last echo was in 2020 essentially normal left and right ventricular function. Last BUN and creatinine were done in July this was normal. He is having a lot of drainage in the left plantar foot wound 10/25; again he comes in today having missed HBO yesterday. Macerated skin around the wound which really does not look very good at all. A lot of edema in the left foot but an improvement in edema on the left leg. We wrapped him last week because of the amount of swelling in the left leg. We could not apply a total contact cast. The patient states that he wants to be able to change his dressing himself. I might consider this if he had stockings to control the swelling. He said he be here for HBO tomorrow. I will check the degree of erythema in his forefoot which I have marked. He is on doxycycline and Augmentin which was renewed  by Dr. Drucilla Schmidt but I cannot see a follow-up note, follow-up inflammatory markers etc. I 1 point he said he was going to see his orthopedic surgeon in [place] I am not sure if he ever did this. I do not know why he has not followed up with infectious disease, he says he was not given an appointment but he is still on Augmentin doxycycline 11/1; he did not do the lab work I ordered last week. Still on Augmentin and doxycycline. Silver alginate and he is changing this daily using his own juxta lite stocking. The surface of the wound does not look too bad. No real epithelialization however. Objective Constitutional Sitting or standing Blood Pressure is within target range for patient.. Pulse regular and within target range for patient.Marland Kitchen Respirations regular, non-labored and within target range.. Temperature is normal and within the target range for the patient.Marland Kitchen Appears in no distress. Vitals Time Taken: 1:17 PM, Weight: 270 lbs, Temperature: 98.1 F, Pulse: 91 bpm, Respiratory Rate: 20 breaths/min, Blood Pressure: 142/71 mmHg, Capillary Blood Glucose: 162  mg/dl. General Notes: Wound exam; the patient's wound does not look as bad as last time. Some macerated loose tissue around the circumference but no debridement was required. Under illumination the surface of the wound looks satisfactory but there is no epithelialization. We use MolecuLight to look at the surface no identifiable surface bacteria Integumentary (Hair, Skin) Wound #3 status is Open. Original cause of wound was Gradually Appeared. The date acquired was: 03/18/2021. The wound has been in treatment 10 weeks. The wound is located on the [place]. The wound measures 4.5cm length x 3.5cm width x 0.3cm depth; 12.37cm^2 area and 3.711cm^3 volume. There is Fat Layer (Subcutaneous Tissue) exposed. There is no tunneling noted, however, there is undermining starting at 12:00 and ending at 3:00 with a maximum distance of 0.4cm. There is a large amount of serosanguineous drainage noted. The wound margin is well defined and not attached to the wound base. There is large (67-100%) red granulation within the wound bed. There is a small (1-33%) amount of necrotic tissue within the wound bed including Adherent Slough. Assessment Active Problems ICD-10 Type 2 diabetes mellitus with foot ulcer Charcot's joint, left ankle and foot Non-pressure chronic ulcer of other part of left foot with other specified severity Chronic multifocal osteomyelitis, left ankle and foot Plan Follow-up Appointments: Return Appointment in 1 week. - Dr. Dellia Nims!!! Bathing/ Shower/ Hygiene: May shower and wash wound with soap and water. - prior to dressing change Edema Control - Lymphedema / SCD / Other: Elevate legs to the level of the heart or above for 30 minutes daily and/or when sitting, a frequency of: - throughout the day Avoid standing for long periods of time. Exercise regularly Moisturize legs daily. Compression stocking or Garment 30-40 mm/Hg pressure to: - wear the juxtalite HD to right and left leg.  apply in the morning and remove at night. Off-Loading: T Contact Cast to Left Lower Extremity - Size 4 Hold due to swelling. otal Additional Orders / Instructions: Follow Nutritious Diet Hyperbaric Oxygen Therapy: Wound #3 Left,Plantar Foot: Evaluate for HBO Therapy - Starts 07/07/21 Indication: - Chronic Refractory Osteomyelitis 2.5 ATA for 90 Minutes with 2 Five (5) Minute Air Breaks T Number of Treatments: - 40 otal One treatments per day (delivered Monday through Friday unless otherwise specified in Special Instructions below): Finger stick Blood Glucose Pre- and Post- HBOT Treatment. Follow Hyperbaric Oxygen Glycemia Protocol Afrin (Oxymetazoline HCL) 0.05% nasal spray - 1 spray in  both nostrils daily as needed prior to HBO treatment for difficulty clearing ears WOUND #3: - Foot Wound Laterality: Plantar, Left Cleanser: Soap and Water 1 x Per Day/15 Days Discharge Instructions: May shower and wash wound with dial antibacterial soap and water prior to dressing change. Cleanser: Wound Cleanser (Generic) 1 x Per Day/15 Days Discharge Instructions: Cleanse the wound with wound cleanser prior to applying a clean dressing using gauze sponges, not tissue or cotton balls. Peri-Wound Care: Zinc Oxide Ointment 30g tube 1 x Per Day/15 Days Discharge Instructions: Apply Zinc Oxide to periwound with each dressing change Prim Dressing: KerraCel Ag Gelling Fiber Dressing, 4x5 in (silver alginate) (Generic) 1 x Per Day/15 Days ary Discharge Instructions: Apply silver alginate to wound bed as instructed Secondary Dressing: ABD Pad, 5x9 (Generic) 1 x Per Day/15 Days Discharge Instructions: Apply over primary dressing as directed. Secondary Dressing: Zetuvit Plus 4x8 in (Generic) 1 x Per Day/15 Days Discharge Instructions: Apply over primary dressing as directed. Secured With: [employer], 4.5x3.1 (in/yd) (Generic) 1 x Per Day/15 Days Discharge Instructions: Secure with Kerlix as  directed. Secured With: 68M Medipore H Soft Cloth Surgical T ape, 2x2 (in/yd) (Generic) 1 x Per Day/15 Days Discharge Instructions: Secure dressing with tape as directed. Com pression Stockings: Circaid Juxta Lite Compression Wrap Compression Amount: 30-40 mmHg (left) Discharge Instructions: Apply Circaid Juxta Lite Compression Wrap daily as instructed. Apply first thing in the morning, remove at night before bed. #1 I have continued with the silver alginate for this week 2. We screened the surface of this with MolecuLight which did not show any fluorescing bacteria 3. We talked about additional offloading other than a total contact cast i.e. crutch use, scooter he seems to have all of these MolecuLight DX: 1st Scanned Wound Fluorescence bacterial imaging was medically necessary today due to Wound previously healing as expected but has recently stalled (flattening of (Indication): the area/volume) MolecuLight DX: 2nd Scanned Wound There was no fluorescence suggestive of a high bacterial load on todays MolecuLight Results scan. MolecuLight Procedure The MolecularLight DX device was cleaned with a disinfectant wipe prior to use., The correct patient profile was confirmed and correct wound was verified., Range finder sensor used to ensure appropriate distance selected The following was completed: between imaging unit and wound bed, Room lights were turned off and the ambient light sensor was checked., Blue circle appeared around the lightbulb., The fluorescence icon was selected. Screen was tapped to enhance focus and the image was captured. Additional drapes were used to ensure adequate darknesso Yes Electronic Signature(s) Signed: 08/18/2021 4:29:54 PM By: Linton Ham MD Entered By: Linton Ham on 08/18/2021 14:18:14 -------------------------------------------------------------------------------- SuperBill Details Patient Name: Date of Service: [name] 08/18/2021 Medical  Record Number: [id number] Patient Account Number: [id number] Date of Birth/Sex: Treating RN: 08/20/1961 (60 y.o. [name] Primary Care Provider: Billey Gosling Other Clinician: Referring Provider: Treating Provider/Extender: Genella Rife in Treatment: 10 Diagnosis Coding ICD-10 Codes Code Description E11.621 Type 2 diabetes mellitus with foot ulcer M14.672 Charcot's joint, left ankle and foot L97.528 Non-pressure chronic ulcer of other part of left foot with other specified severity M86.372 Chronic multifocal osteomyelitis, left ankle and foot Facility Procedures CPT4 Code: [id number] Description: 99213 - WOUND CARE VISIT-LEV 3 EST PT Modifier: Quantity: 1 Physician Procedures : CPT4 Code Description Modifier [id number] NONCNTACT RT FLORO WND 1ST STE ICD-10 Diagnosis Description E11.621 Type 2 diabetes mellitus with foot ulcer L97.528 Non-pressure chronic ulcer of other part of left foot with  other specified severity Quantity: 1 Electronic Signature(s) Signed: 08/18/2021 4:29:54 PM By: Linton Ham MD Entered By: Linton Ham on 08/18/2021 14:18:42

## 2021-08-18 NOTE — Progress Notes (Addendum)
[name], [name] ([id number]) Visit Report for 08/18/2021 Arrival Information Details Patient Name: Date of Service: [name], [name] 08/18/2021 12:45 PM Medical Record Number: [id number] Patient Account Number: [id number] Date of Birth/Sex: Treating RN: 02/27/1961 (60 y.o. [name] Primary Care [name]: [name] Other Clinician: Referring [name]: Treating [name]: [name] in Treatment: 10 Visit Information History Since Last Visit Added or deleted any medications: No Patient Arrived: [name] Any new allergies or adverse reactions: No Arrival Time: 13:15 Had a fall or experienced change in No Accompanied By: self activities of daily living that may affect Transfer Assistance: Manual risk of falls: Patient Identification Verified: Yes Signs or symptoms of abuse/neglect since last visito No Secondary Verification Process Completed: Yes Hospitalized since last visit: No Patient Requires Transmission-Based Precautions: No Implantable device outside of the clinic excluding No Patient Has Alerts: Yes cellular tissue based products placed in the center since last visit: Has Dressing in Place as Prescribed: Yes Pain Present Now: No Electronic Signature(s) Signed: 08/18/2021 4:48:23 PM By: Rhae Hammock RN Entered By: Rhae Hammock on 08/18/2021 13:17:28 -------------------------------------------------------------------------------- Clinic Level of Care Assessment Details Patient Name: Date of Service: [name] 08/18/2021 12:45 PM Medical Record Number: [id number] Patient Account Number: [id number] Date of Birth/Sex: Treating RN: [birth year] (60 y.o. [name], [name] Primary Care [name]: [name] Other Clinician: Referring [name]: Treating [name]: [name] in Treatment: 10 Clinic Level of Care Assessment Items TOOL 4 Quantity Score X- 1 0 Use when only an EandM is  performed on FOLLOW-UP visit ASSESSMENTS - Nursing Assessment / Reassessment X- 1 10 Reassessment of Co-morbidities (includes updates in patient status) X- 1 5 Reassessment of Adherence to Treatment Plan ASSESSMENTS - Wound and Skin A ssessment / Reassessment X - Simple Wound Assessment / Reassessment - one wound 1 5 []  - 0 Complex Wound Assessment / Reassessment - multiple wounds []  - 0 Dermatologic / Skin Assessment (not related to wound area) ASSESSMENTS - Focused Assessment X- 1 5 Circumferential Edema Measurements - multi extremities []  - 0 Nutritional Assessment / Counseling / Intervention []  - 0 Lower Extremity Assessment (monofilament, tuning fork, pulses) []  - 0 Peripheral Arterial Disease Assessment (using hand held doppler) ASSESSMENTS - Ostomy and/or Continence Assessment and Care []  - 0 Incontinence Assessment and Management []  - 0 Ostomy Care Assessment and Management (repouching, etc.) PROCESS - Coordination of Care []  - 0 Simple Patient / Family Education for ongoing care X- 1 20 Complex (extensive) Patient / Family Education for ongoing care X- 1 10 Staff obtains Programmer, systems, Records, T Results / Process Orders est []  - 0 Staff telephones HHA, Nursing Homes / Clarify orders / etc []  - 0 Routine Transfer to another Facility (non-emergent condition) []  - 0 Routine Hospital Admission (non-emergent condition) []  - 0 New Admissions / Biomedical engineer / Ordering NPWT Apligraf, etc. , []  - 0 Emergency Hospital Admission (emergent condition) X- 1 10 Simple Discharge Coordination []  - 0 Complex (extensive) Discharge Coordination PROCESS - Special Needs []  - 0 Pediatric / Minor Patient Management []  - 0 Isolation Patient Management []  - 0 Hearing / Language / Visual special needs []  - 0 Assessment of Community assistance (transportation, D/C planning, etc.) []  - 0 Additional assistance / Altered mentation []  - 0 Support Surface(s) Assessment  (bed, cushion, seat, etc.) INTERVENTIONS - Wound Cleansing / Measurement X - Simple Wound Cleansing - one wound 1 5 []  - 0 Complex Wound Cleansing - multiple wounds X- 1 5 Wound Imaging (  photographs - any number of wounds) []  - 0 Wound Tracing (instead of photographs) X- 1 5 Simple Wound Measurement - one wound []  - 0 Complex Wound Measurement - multiple wounds INTERVENTIONS - Wound Dressings X - Small Wound Dressing one or multiple wounds 1 10 []  - 0 Medium Wound Dressing one or multiple wounds []  - 0 Large Wound Dressing one or multiple wounds X- 1 5 Application of Medications - topical []  - 0 Application of Medications - injection INTERVENTIONS - Miscellaneous []  - 0 External ear exam []  - 0 Specimen Collection (cultures, biopsies, blood, body fluids, etc.) []  - 0 Specimen(s) / Culture(s) sent or taken to Lab for analysis []  - 0 Patient Transfer (multiple staff / Civil Service fast streamer / Similar devices) []  - 0 Simple Staple / Suture removal (25 or less) []  - 0 Complex Staple / Suture removal (26 or more) []  - 0 Hypo / Hyperglycemic Management (close monitor of Blood Glucose) []  - 0 Ankle / Brachial Index (ABI) - do not check if billed separately X- 1 5 Vital Signs Has the patient been seen at the hospital within the last three years: Yes Total Score: 100 Level Of Care: New/Established - Level 3 Electronic Signature(s) Signed: 08/18/2021 4:48:23 PM By: Rhae Hammock RN Entered By: Rhae Hammock on 08/18/2021 13:54:57 -------------------------------------------------------------------------------- Encounter Discharge Information Details Patient Name: Date of Service: [name] 08/18/2021 12:45 PM Medical Record Number: [id number] Patient Account Number: [id number] Date of Birth/Sex: Treating RN: 1960/12/04 (60 y.o. [name] Primary Care Melvena Vink: [name] Other Clinician: Referring Yuna Pizzolato: Treating Sowmya Partridge/Extender: [name] in Treatment: 10 Encounter Discharge Information Items Discharge Condition: Stable Ambulatory Status: Walker Discharge Destination: Home Transportation: Private Auto Accompanied By: self Schedule Follow-up Appointment: Yes Clinical Summary of Care: Patient Declined Electronic Signature(s) Signed: 08/18/2021 4:48:23 PM By: Rhae Hammock RN Entered By: Rhae Hammock on 08/18/2021 14:08:28 -------------------------------------------------------------------------------- Lower Extremity Assessment Details Patient Name: Date of Service: [name], [name] 08/18/2021 12:45 PM Medical Record Number: [id number] Patient Account Number: [id number] Date of Birth/Sex: Treating RN: 08-31-1961 (60 y.o. [name] Primary Care Nasha Diss: [name] Other Clinician: Referring Khriz Liddy: Treating Tannar Broker/Extender: [name] in Treatment: 10 Edema Assessment Assessed: Shirlyn Goltz: No] [name]: No] Edema: [Left: Yes] [Right: Yes] Calf Left: Right: Point of Measurement: 36 cm From Medial Instep 41 cm 46 cm Ankle Left: Right: Point of Measurement: 13 cm From Medial Instep 26 cm 32.5 cm Electronic Signature(s) Signed: 08/18/2021 4:48:23 PM By: Rhae Hammock RN Entered By: Rhae Hammock on 08/18/2021 13:18:09 -------------------------------------------------------------------------------- Multi Wound Chart Details Patient Name: Date of Service: [name] 08/18/2021 12:45 PM Medical Record Number: [id number] Patient Account Number: [id number] Date of Birth/Sex: Treating RN: 1961-01-21 (60 y.o. [name] Primary Care Eithan Beagle: [name] Other Clinician: Referring Braidan Ricciardi: Treating Liese Dizdarevic/Extender: [name] in Treatment: 10 Vital Signs Height(in): Capillary Blood Glucose(mg/dl): 162 Weight(lbs): 270 Pulse(bpm): 30 Body Mass Index(BMI): Blood Pressure(mmHg): 142/71 Temperature(F):  98.1 Respiratory Rate(breaths/min): 20 Photos: [N/A:N/A] Left, Plantar Foot N/A N/A Wound Location: Gradually Appeared N/A N/A Wounding Event: Diabetic Wound/Ulcer of the Lower N/A N/A Primary Etiology: Extremity Anemia, Sleep Apnea, Congestive N/A N/A Comorbid History: Heart Failure, Hypertension, Peripheral Venous Disease, Cirrhosis , Type II Diabetes, Osteoarthritis, Neuropathy 03/18/2021 N/A N/A Date Acquired: 10 N/A N/A Weeks of Treatment: Open N/A N/A Wound Status: 4.5x3.5x0.3 N/A N/A Measurements L x W x D (cm) 12.37 N/A N/A A (cm) : rea 3.711 N/A N/A Volume (cm) : -  75.00% N/A N/A % Reduction in A rea: -424.90% N/A N/A % Reduction in Volume: 12 Starting Position 1 (o'clock): 3 Ending Position 1 (o'clock): 0.4 Maximum Distance 1 (cm): Yes N/A N/A Undermining: Grade 3 N/A N/A Classification: Large N/A N/A Exudate A mount: Serosanguineous N/A N/A Exudate Type: red, brown N/A N/A Exudate Color: Well defined, not attached N/A N/A Wound Margin: Large (67-100%) N/A N/A Granulation A mount: Red N/A N/A Granulation Quality: Small (1-33%) N/A N/A Necrotic A mount: Fat Layer (Subcutaneous Tissue): Yes N/A N/A Exposed Structures: Fascia: No Tendon: No Muscle: No Joint: No Bone: No Small (1-33%) N/A N/A Epithelialization: Treatment Notes Wound #3 (Foot) Wound Laterality: Plantar, Left Cleanser Soap and Water Discharge Instruction: May shower and wash wound with dial antibacterial soap and water prior to dressing change. Wound Cleanser Discharge Instruction: Cleanse the wound with wound cleanser prior to applying a clean dressing using gauze sponges, not tissue or cotton balls. Peri-Wound Care Zinc Oxide Ointment 30g tube Discharge Instruction: Apply Zinc Oxide to periwound with each dressing change Topical Primary Dressing KerraCel Ag Gelling Fiber Dressing, 4x5 in (silver alginate) Discharge Instruction: Apply silver alginate to wound bed as  instructed Secondary Dressing ABD Pad, 5x9 Discharge Instruction: Apply over primary dressing as directed. Zetuvit Plus 4x8 in Discharge Instruction: Apply over primary dressing as directed. Secured With [employer], 4.5x3.1 (in/yd) Discharge Instruction: Secure with Kerlix as directed. 37M Medipore H Soft Cloth Surgical T ape, 2x2 (in/yd) Discharge Instruction: Secure dressing with tape as directed. Compression Wrap Compression Stockings Circaid Juxta Lite Compression Wrap Quantity: 1 Left Leg Compression Amount: 30-40 mmHg Discharge Instruction: Apply Circaid Juxta Lite Compression Wrap daily as instructed. Apply first thing in the morning, remove at night before bed. Add-Ons Electronic Signature(s) Signed: 08/18/2021 4:29:54 PM By: Linton Ham MD Signed: 08/18/2021 4:48:23 PM By: Rhae Hammock RN Entered By: Linton Ham on 08/18/2021 14:13:49 -------------------------------------------------------------------------------- Gay Details Patient Name: Date of Service: [name], [name] [name] 08/18/2021 12:45 PM Medical Record Number: [id number] Patient Account Number: [id number] Date of Birth/Sex: Treating RN: [birth year] (60 y.o. [name], [name] Primary Care Norah Fick: [name] Other Clinician: Referring Knolan Simien: Treating Ariauna Farabee/Extender: [name] in Treatment: 10 Multidisciplinary Care Plan reviewed with physician Active Inactive HBO Nursing Diagnoses: Anxiety related to feelings of confinement associated with the hyperbaric oxygen chamber Anxiety related to knowledge deficit of hyperbaric oxygen therapy and treatment procedures Discomfort related to temperature and humidity changes inside hyperbaric chamber Potential for barotraumas to ears, sinuses, teeth, and lungs or cerebral gas embolism related to changes in atmospheric pressure inside hyperbaric oxygen chamber Potential for oxygen toxicity  seizures related to delivery of 100% oxygen at an increased atmospheric pressure Potential for pulmonary oxygen toxicity related to delivery of 100% oxygen at an increased atmospheric pressure Goals: Barotrauma will be prevented during HBO2 Date Initiated: 06/04/2021 Target Resolution Date: 09/10/2021 Goal Status: Active Patient and/or family will be able to state/discuss factors appropriate to the management of their disease process during treatment Date Initiated: 06/04/2021 Date Inactivated: 08/04/2021 Target Resolution Date: 08/14/2021 Goal Status: Met Patient will tolerate the hyperbaric oxygen therapy treatment Date Initiated: 06/04/2021 Target Resolution Date: 09/19/2021 Goal Status: Active Patient will tolerate the internal climate of the chamber Date Initiated: 06/04/2021 Target Resolution Date: 09/19/2021 Goal Status: Active Patient/caregiver will verbalize understanding of HBO goals, rationale, procedures and potential hazards Date Initiated: 06/04/2021 Date Inactivated: 07/28/2021 Target Resolution Date: 08/14/2021 Goal Status: Met Signs and symptoms of pulmonary oxygen toxicity will be recognized  and promptly addressed Date Initiated: 06/04/2021 Target Resolution Date: 09/19/2021 Goal Status: Active Signs and symptoms of seizure will be recognized and promptly addressed ; seizing patients will suffer no harm Date Initiated: 06/04/2021 Target Resolution Date: 09/19/2021 Goal Status: Active Interventions: Administer a five (5) minute air break for patient if signs and symptoms of seizure appear and notify the hyperbaric physician Administer decongestants, per physician orders, prior to HBO2 Administer the correct therapeutic gas delivery based on the patients needs and limitations, per physician order Assess and provide for patients comfort related to the hyperbaric environment and equalization of middle ear Assess for signs and symptoms related to adverse events, including but  not limited to confinement anxiety, pneumothorax, oxygen toxicity and baurotrauma Assess patient for any history of confinement anxiety Assess patient's knowledge and expectations regarding hyperbaric medicine and provide education related to the hyperbaric environment, goals of treatment and prevention of adverse events Implement protocols to decrease risk of pneumothorax in high risk patients Notes: Nutrition Nursing Diagnoses: Impaired glucose control: actual or potential Potential for alteratiion in Nutrition/Potential for imbalanced nutrition Goals: Patient/caregiver agrees to and verbalizes understanding of need to use nutritional supplements and/or vitamins as prescribed Date Initiated: 06/04/2021 Date Inactivated: 07/07/2021 Target Resolution Date: 07/04/2021 Goal Status: Met Patient/caregiver will maintain therapeutic glucose control Date Initiated: 06/04/2021 Target Resolution Date: 09/19/2021 Goal Status: Active Interventions: Assess HgA1c results as ordered upon admission and as needed Assess patient nutrition upon admission and as needed per policy Provide education on elevated blood sugars and impact on wound healing Notes: 07/07/21: Glucose control ongoing Electronic Signature(s) Signed: 08/18/2021 4:48:23 PM By: Rhae Hammock RN Entered By: Rhae Hammock on 08/18/2021 13:18:47 -------------------------------------------------------------------------------- Pain Assessment Details Patient Name: Date of Service: [name], [name] 08/18/2021 12:45 PM Medical Record Number: [id number] Patient Account Number: [id number] Date of Birth/Sex: Treating RN: Feb 24, 1961 (60 y.o. [name] Primary Care Demarus Latterell: [name] Other Clinician: Referring Troi Bechtold: Treating Nakota Elsen/Extender: [name] in Treatment: 10 Active Problems Location of Pain Severity and Description of Pain Patient Has Paino No Site Locations Pain Management and  Medication Current Pain Management: Electronic Signature(s) Signed: 08/18/2021 4:48:23 PM By: Rhae Hammock RN Entered By: Rhae Hammock on 08/18/2021 13:17:53 -------------------------------------------------------------------------------- Patient/Caregiver Education Details Patient Name: Date of Service: [name] 11/1/2022andnbsp12:45 PM Medical Record Number: [id number] Patient Account Number: [id number] Date of Birth/Gender: Treating RN: [birth year] (60 y.o. [name] Primary Care Physician: [name] Other Clinician: Referring Physician: Treating Physician/Extender: [name] in Treatment: 10 Education Assessment Education Provided To: Patient Education Topics Provided Elevated Blood Sugar/ Impact on Healing: Methods: Explain/Verbal Responses: Reinforcements needed Electronic Signature(s) Signed: 08/18/2021 4:48:23 PM By: Rhae Hammock RN Entered By: Rhae Hammock on 08/18/2021 13:19:04 -------------------------------------------------------------------------------- Wound Assessment Details Patient Name: Date of Service: [name], [name] 08/18/2021 12:45 PM Medical Record Number: [id number] Patient Account Number: [id number] Date of Birth/Sex: Treating RN: 12/08/1960 (60 y.o. [name], [name] Primary Care Titus Drone: [name] Other Clinician: Referring Kaiyan Luczak: Treating Cristina Ceniceros/Extender: [name] in Treatment: 10 Wound Status Wound Number: 3 Primary Diabetic Wound/Ulcer of the Lower Extremity Etiology: Wound Location: Left, Plantar Foot Wound Open Wounding Event: Gradually Appeared Status: Date Acquired: 03/18/2021 Comorbid Anemia, Sleep Apnea, Congestive Heart Failure, Hypertension, Weeks Of Treatment: 10 History: Peripheral Venous Disease, Cirrhosis , Type II Diabetes, Clustered Wound: No Osteoarthritis, Neuropathy Photos Wound Measurements Length: (cm) 4.5 Width: (cm)  3.5 Depth: (cm) 0.3 Area: (cm) 12.37 Volume: (cm) 3.711 % Reduction in Area: -75% %  Reduction in Volume: -424.9% Epithelialization: Small (1-33%) Tunneling: No Undermining: Yes Starting Position (o'clock): 12 Ending Position (o'clock): 3 Maximum Distance: (cm) 0.4 Wound Description Classification: Grade 3 Wound Margin: Well defined, not attached Exudate Amount: Large Exudate Type: Serosanguineous Exudate Color: red, brown Foul Odor After Cleansing: No Slough/Fibrino Yes Wound Bed Granulation Amount: Large (67-100%) Exposed Structure Granulation Quality: Red Fascia Exposed: No Necrotic Amount: Small (1-33%) Fat Layer (Subcutaneous Tissue) Exposed: Yes Necrotic Quality: Adherent Slough Tendon Exposed: No Muscle Exposed: No Joint Exposed: No Bone Exposed: No Treatment Notes Wound #3 (Foot) Wound Laterality: Plantar, Left Cleanser Soap and Water Discharge Instruction: May shower and wash wound with dial antibacterial soap and water prior to dressing change. Wound Cleanser Discharge Instruction: Cleanse the wound with wound cleanser prior to applying a clean dressing using gauze sponges, not tissue or cotton balls. Peri-Wound Care Zinc Oxide Ointment 30g tube Discharge Instruction: Apply Zinc Oxide to periwound with each dressing change Topical Primary Dressing KerraCel Ag Gelling Fiber Dressing, 4x5 in (silver alginate) Discharge Instruction: Apply silver alginate to wound bed as instructed Secondary Dressing ABD Pad, 5x9 Discharge Instruction: Apply over primary dressing as directed. Zetuvit Plus 4x8 in Discharge Instruction: Apply over primary dressing as directed. Secured With [employer], 4.5x3.1 (in/yd) Discharge Instruction: Secure with Kerlix as directed. 5M Medipore H Soft Cloth Surgical T ape, 2x2 (in/yd) Discharge Instruction: Secure dressing with tape as directed. Compression Wrap Compression Stockings Circaid Juxta Lite Compression  Wrap Quantity: 1 Left Leg Compression Amount: 30-40 mmHg Discharge Instruction: Apply Circaid Juxta Lite Compression Wrap daily as instructed. Apply first thing in the morning, remove at night before bed. Add-Ons Electronic Signature(s) Signed: 08/19/2021 4:50:32 PM By: Rhae Hammock RN Previous Signature: 08/18/2021 4:48:23 PM Version By: Rhae Hammock RN Entered By: Rhae Hammock on 08/18/2021 16:54:05 -------------------------------------------------------------------------------- Vitals Details Patient Name: Date of Service: [name], [name] 08/18/2021 12:45 PM Medical Record Number: [id number] Patient Account Number: [id number] Date of Birth/Sex: Treating RN: Feb 06, 1961 (60 y.o. [name], [name] Primary Care Lailany Enoch: [name] Other Clinician: Referring Elsey Holts: Treating Raynette Arras/Extender: [name] in Treatment: 10 Vital Signs Time Taken: 13:17 Temperature (F): 98.1 Weight (lbs): 270 Pulse (bpm): 91 Respiratory Rate (breaths/min): 20 Blood Pressure (mmHg): 142/71 Capillary Blood Glucose (mg/dl): 162 Reference Range: 80 - 120 mg / dl Electronic Signature(s) Signed: 08/18/2021 4:48:23 PM By: Rhae Hammock RN Entered By: Rhae Hammock on 08/18/2021 13:17:46

## 2021-08-18 NOTE — Progress Notes (Signed)
CRISTIN, SZATKOWSKI ([id number]) Visit Report for 08/18/2021 SuperBill Details Patient Name: Date of Service: NUNO, BRUBACHER 08/18/2021 Medical Record Number: [id number] Patient Account Number: [id number] Date of Birth/Sex: Treating RN: 22-Nov-1960 (60 y.o. Charlean Merl, Lauren Primary Care Provider: [name] Other Clinician: Haywood Pao Referring Provider: Treating Provider/Extender: Avie Echevaria in Treatment: 10 Diagnosis Coding ICD-10 Codes Code Description [phone] Type 2 diabetes mellitus with foot ulcer M14.672 Charcot's joint, left ankle and foot L97.528 Non-pressure chronic ulcer of other part of left foot with other specified severity M86.372 Chronic multifocal osteomyelitis, left ankle and foot Facility Procedures CPT4 Code Description Modifier Quantity [id number]-(Facility Use Only) HBOT full body chamber, , 4 ICD-10 Diagnosis Description E11.621 Type 2 diabetes mellitus with foot ulcer M14.672 Charcot's joint, left ankle and foot Physician Procedures Quantity CPT4 Code Description Modifier [id number] - WC PHYS HYPERBARIC OXYGEN THERAPY 1 ICD-10 Diagnosis Description E11.621 Type 2 diabetes mellitus with foot ulcer M14.672 Charcot's joint, left ankle and foot Electronic Signature(s) Signed: 08/18/2021 1:53:34 PM By: Haywood Pao CHT EMT BS , , Signed: 08/18/2021 4:29:54 PM By: Baltazar Najjar MD Entered By: Haywood Pao on 08/18/2021 13:53:32

## 2021-08-19 ENCOUNTER — Other Ambulatory Visit: Payer: Self-pay

## 2021-08-19 ENCOUNTER — Encounter (HOSPITAL_BASED_OUTPATIENT_CLINIC_OR_DEPARTMENT_OTHER): Payer: Medicare Other | Admitting: Physician Assistant

## 2021-08-19 DIAGNOSIS — E11621 Type 2 diabetes mellitus with foot ulcer: Secondary | ICD-10-CM | POA: Diagnosis not present

## 2021-08-19 LAB — GLUCOSE, CAPILLARY
Glucose-Capillary: 171 mg/dL — ABNORMAL HIGH (ref 70–99)
Glucose-Capillary: 137 mg/dL — ABNORMAL HIGH (ref 70–99)

## 2021-08-19 NOTE — Progress Notes (Addendum)
VEGAS, FRITZE ([id number]) Visit Report for 08/19/2021 HBO Details Patient Name: Date of Service: [name], [name] 08/19/2021 10:00 A M Medical Record Number: [id number] Patient Account Number: [id number] Date of Birth/Sex: Treating RN: 1961-09-19 (60 y.o. [name] Primary Care Lafaye Mcelmurry: [name] Other Clinician: Haywood Pao Referring Rashaud Ybarbo: Treating Mikal Blasdell/Extender: [name] in Treatment: 10 HBO Treatment Course Details Treatment Course Number: 1 Ordering Shamyah Stantz: Baltazar Najjar T Treatments Ordered: otal 40 HBO Treatment Start Date: 07/07/2021 HBO Indication: Chronic Refractory Osteomyelitis to Left,Plantar Foot HBO Treatment Details Treatment Number: 24 Patient Type: Outpatient Chamber Type: Monoplace Chamber Serial #: [id number] Treatment Protocol: 2.5 ATA with 90 minutes oxygen, with two 5 minute air breaks Treatment Details Compression Rate Down: 2.0 psi / minute De-Compression Rate Up: 2.0 psi / minute A breaks and breathing ir Compress Tx Pressure periods Decompress Decompress Begins Reached (leave unused spaces Begins Ends blank) Chamber Pressure (ATA 1 2.5 2.5 2.5 2.5 2.5 - - 2.5 1 ) Clock Time (24 hr) 11:00 11:11 11:41 11:46 12:16 12:21 - - 12:51 13:02 Treatment Length: 122 (minutes) Treatment Segments: 4 Vital Signs Capillary Blood Glucose Reference Range: 80 - 120 mg / dl HBO Diabetic Blood Glucose Intervention Range: <131 mg/dl or >852 mg/dl Time Vitals Blood Respiratory Capillary Blood Glucose Pulse Action Type: Pulse: Temperature: Taken: Pressure: Rate: Glucose (mg/dl): Meter #: Oximetry (%) Taken: Pre 10:35 111/74 98 18 97.7 171 Post 13:07 141/63 90 18 97.5 137 Treatment Response Treatment Toleration: Well Treatment Completion Status: Treatment Completed without Adverse Event Electronic Signature(s) Signed: 08/19/2021 2:07:11 PM By: Haywood Pao CHT EMT BS , , Signed: 08/19/2021 6:26:15 PM By:  Lenda Kelp PA-C Entered By: Haywood Pao on 08/19/2021 14:07:11 -------------------------------------------------------------------------------- HBO Safety Checklist Details Patient Name: Date of Service: [name] IG 08/19/2021 10:00 A M Medical Record Number: [id number] Patient Account Number: [id number] Date of Birth/Sex: Treating RN: Jan 15, 1961 (60 y.o. [name] Primary Care Mikaeel Petrow: [name] Other Clinician: Haywood Pao Referring Mckinzey Entwistle: Treating Kellie Murrill/Extender: Atlee Abide Weeks in Treatment: 10 HBO Safety Checklist Items Safety Checklist Consent Form Signed Patient voided / foley secured and emptied When did you last eato snack this AM Last dose of injectable or oral agent n/a Ostomy pouch emptied and vented if applicable NA All implantable devices assessed, documented and approved NA Intravenous access site secured and place NA Valuables secured NA Linens and cotton and cotton/polyester blend (less than 51% polyester) Personal oil-based products / skin lotions / body lotions removed Wigs or hairpieces removed NA Smoking or tobacco materials removed NA Books / newspapers / magazines / loose paper removed Cologne, aftershave, perfume and deodorant removed Jewelry removed (may wrap wedding band) Make-up removed NA Hair care products removed NA Battery operated devices (external) removed Heating patches and chemical warmers removed Titanium eyewear removed NA Nail polish cured greater than 10 hours NA Casting material cured greater than 10 hours NA Hearing aids removed NA Loose dentures or partials removed NA Prosthetics have been removed NA Patient demonstrates correct use of air break device (if applicable) Patient concerns have been addressed Patient grounding bracelet on and cord attached to chamber Specifics for Inpatients (complete in addition to above) Medication sheet sent with  patient NA Intravenous medications needed or due during therapy sent with patient NA Drainage tubes (e.g. nasogastric tube or chest tube secured and vented) NA Endotracheal or Tracheotomy tube secured NA Cuff deflated of air and inflated with saline NA Airway suctioned NA Notes Paper  version used prior to treatment. Electronic Signature(s) Signed: 08/19/2021 12:47:03 PM By: Haywood Pao CHT EMT BS , , Entered By: Haywood Pao on 08/19/2021 12:47:03

## 2021-08-19 NOTE — Progress Notes (Addendum)
ARTEMUS, ROMANOFF ([id number]) Visit Report for 08/18/2021 Arrival Information Details Patient Name: Date of Service: AHMAAD, NEIDHARDT 08/18/2021 10:00 A M Medical Record Number: [id number] Patient Account Number: [id number] Date of Birth/Sex: Treating RN: January 25, 1961 (60 y.o. Charlean Merl, Lauren Primary Care Abrham Maslowski: [name] Other Clinician: Haywood Pao Referring Raeley Gilmore: Treating Yareth Kearse/Extender: Avie Echevaria in Treatment: 10 Visit Information History Since Last Visit All ordered tests and consults were completed: Yes Patient Arrived: [name] Added or deleted any medications: No Arrival Time: 10:16 Any new allergies or adverse reactions: No Accompanied By: self Had a fall or experienced change in No Transfer Assistance: None activities of daily living that may affect Patient Identification Verified: Yes risk of falls: Secondary Verification Process Completed: Yes Signs or symptoms of abuse/neglect since last visito No Patient Requires Transmission-Based Precautions: No Hospitalized since last visit: No Patient Has Alerts: Yes Implantable device outside of the clinic excluding No cellular tissue based products placed in the center since last visit: Pain Present Now: No Electronic Signature(s) Signed: 08/18/2021 1:45:20 PM By: Haywood Pao CHT EMT BS , , Entered By: Haywood Pao on 08/18/2021 13:45:19 -------------------------------------------------------------------------------- Encounter Discharge Information Details Patient Name: Date of Service: [name] 08/18/2021 10:00 A M Medical Record Number: [id number] Patient Account Number: [id number] Date of Birth/Sex: Treating RN: 05/14/1961 (60 y.o. Charlean Merl, Lauren Primary Care Jerel Sardina: [name] Other Clinician: Haywood Pao Referring Kippy Gohman: Treating Koleson Reifsteck/Extender: Avie Echevaria in Treatment: 10 Encounter Discharge Information  Items Discharge Condition: Stable Ambulatory Status: Wheelchair Discharge Destination: Home Transportation: Private Auto Accompanied By: self Schedule Follow-up Appointment: No Clinical Summary of Care: Electronic Signature(s) Signed: 08/18/2021 1:54:23 PM By: Haywood Pao CHT EMT BS , , Entered By: Haywood Pao on 08/18/2021 13:54:22 -------------------------------------------------------------------------------- Vitals Details Patient Name: Date of Service: [name] 08/18/2021 10:00 A M Medical Record Number: [id number] Patient Account Number: [id number] Date of Birth/Sex: Treating RN: 1961/09/04 (60 y.o. Charlean Merl, Lauren Primary Care Lydiah Pong: [name] Other Clinician: Haywood Pao Referring Ronzell Laban: Treating Ariauna Farabee/Extender: Avie Echevaria in Treatment: 10 Vital Signs Time Taken: 10:27 Temperature (F): 97.8 Weight (lbs): 270 Pulse (bpm): 97 Respiratory Rate (breaths/min): 18 Blood Pressure (mmHg): 115/67 Capillary Blood Glucose (mg/dl): 676 Reference Range: 80 - 120 mg / dl Electronic Signature(s) Signed: 08/18/2021 1:46:30 PM By: Haywood Pao CHT EMT BS , , Entered By: Haywood Pao on 08/18/2021 13:46:29

## 2021-08-19 NOTE — Progress Notes (Addendum)
[name], [name] ([id number]) Visit Report for 08/19/2021 Arrival Information Details Patient Name: Date of Service: [name], [name] 08/19/2021 10:00 A M Medical Record Number: [id number] Patient Account Number: [id number] Date of Birth/Sex: Treating RN: 1961/08/31 (60 y.o. [name], [name] Primary Care Zadyn Yardley: [name] Other Clinician: Haywood Pao Referring Saleena Tamas: Treating Tameisha Covell/Extender: [name] in Treatment: 10 Visit Information History Since Last Visit All ordered tests and consults were completed: Yes Patient Arrived: [name] Added or deleted any medications: No Arrival Time: 10:23 Any new allergies or adverse reactions: No Accompanied By: self Had a fall or experienced change in No Transfer Assistance: None activities of daily living that may affect Patient Identification Verified: Yes risk of falls: Secondary Verification Process Completed: Yes Signs or symptoms of abuse/neglect since last visito No Patient Requires Transmission-Based Precautions: No Hospitalized since last visit: No Patient Has Alerts: Yes Implantable device outside of the clinic excluding No cellular tissue based products placed in the center since last visit: Pain Present Now: No Electronic Signature(s) Signed: 08/19/2021 12:20:30 PM By: Haywood Pao CHT EMT BS , , Entered By: Haywood Pao on 08/19/2021 12:20:30 -------------------------------------------------------------------------------- Encounter Discharge Information Details Patient Name: Date of Service: [name] 08/19/2021 10:00 A M Medical Record Number: [id number] Patient Account Number: [id number] Date of Birth/Sex: Treating RN: 01/07/1961 (60 y.o. [name] Primary Care [name]: [name] Other Clinician: Haywood Pao Referring [name]: Treating [name]/Extender: [name] in Treatment: 10 Encounter Discharge Information  Items Discharge Condition: Stable Ambulatory Status: Walker Discharge Destination: Home Transportation: Private Auto Accompanied By: self Schedule Follow-up Appointment: No Clinical Summary of Care: Electronic Signature(s) Signed: 08/19/2021 2:08:14 PM By: Haywood Pao CHT EMT BS , , Entered By: Haywood Pao on 08/19/2021 14:08:14 -------------------------------------------------------------------------------- Vitals Details Patient Name: Date of Service: [name] 08/19/2021 10:00 A M Medical Record Number: [id number] Patient Account Number: [id number] Date of Birth/Sex: Treating RN: 04/14/1961 (60 y.o. [name] Primary Care [name]: [name] Other Clinician: Haywood Pao Referring [name]: Treating Suzzanne Brunkhorst/Extender: Atlee Abide Weeks in Treatment: 10 Vital Signs Time Taken: 10:35 Temperature (F): 97.7 Weight (lbs): 270 Pulse (bpm): 98 Respiratory Rate (breaths/min): 18 Blood Pressure (mmHg): 111/74 Capillary Blood Glucose (mg/dl): 400 Reference Range: 80 - 120 mg / dl Electronic Signature(s) Signed: 08/19/2021 12:45:06 PM By: Haywood Pao CHT EMT BS , , Entered By: Haywood Pao on 08/19/2021 12:45:05

## 2021-08-19 NOTE — Progress Notes (Signed)
WIRT, HEMMERICH ([id number]) Visit Report for 08/19/2021 SuperBill Details Patient Name: Date of Service: [name], [name] 08/19/2021 Medical Record Number: [id number] Patient Account Number: [id number] Date of Birth/Sex: Treating RN: 06-19-1961 (60 y.o. [name] Primary Care Provider: [name] Other Clinician: Haywood Pao Referring Provider: Treating Provider/Extender: Atlee Abide Weeks in Treatment: 10 Diagnosis Coding ICD-10 Codes Code Description E11.621 Type 2 diabetes mellitus with foot ulcer M14.672 Charcot's joint, left ankle and foot L97.528 Non-pressure chronic ulcer of other part of left foot with other specified severity M86.372 Chronic multifocal osteomyelitis, left ankle and foot Facility Procedures CPT4 Code Description Modifier Quantity 96283662 G0277-(Facility Use Only) HBOT full body chamber, , 4 ICD-10 Diagnosis Description E11.621 Type 2 diabetes mellitus with foot ulcer M14.672 Charcot's joint, left ankle and foot Physician Procedures Quantity CPT4 Code Description Modifier [id number] 99183 - WC PHYS HYPERBARIC OXYGEN THERAPY 1 ICD-10 Diagnosis Description E11.621 Type 2 diabetes mellitus with foot ulcer M14.672 Charcot's joint, left ankle and foot Electronic Signature(s) Signed: 08/19/2021 2:07:36 PM By: Haywood Pao CHT EMT BS , , Signed: 08/19/2021 6:26:15 PM By: Lenda Kelp PA-C Entered By: Haywood Pao on 08/19/2021 14:07:35

## 2021-08-20 ENCOUNTER — Encounter (HOSPITAL_BASED_OUTPATIENT_CLINIC_OR_DEPARTMENT_OTHER): Payer: Medicare Other | Admitting: Internal Medicine

## 2021-08-21 ENCOUNTER — Encounter (HOSPITAL_BASED_OUTPATIENT_CLINIC_OR_DEPARTMENT_OTHER): Payer: Medicare Other | Admitting: Internal Medicine

## 2021-08-24 ENCOUNTER — Encounter (HOSPITAL_BASED_OUTPATIENT_CLINIC_OR_DEPARTMENT_OTHER): Payer: Medicare Other | Admitting: Internal Medicine

## 2021-08-24 ENCOUNTER — Other Ambulatory Visit: Payer: Self-pay

## 2021-08-24 DIAGNOSIS — E11621 Type 2 diabetes mellitus with foot ulcer: Secondary | ICD-10-CM | POA: Diagnosis not present

## 2021-08-24 DIAGNOSIS — M86372 Chronic multifocal osteomyelitis, left ankle and foot: Secondary | ICD-10-CM

## 2021-08-24 LAB — GLUCOSE, CAPILLARY
Glucose-Capillary: 135 mg/dL — ABNORMAL HIGH (ref 70–99)
Glucose-Capillary: 133 mg/dL — ABNORMAL HIGH (ref 70–99)

## 2021-08-24 NOTE — Progress Notes (Signed)
[name], [name] ([id number]) Visit Report for 08/24/2021 SuperBill Details Patient Name: Date of Service: [name], [name] 08/24/2021 Medical Record Number: [id number] Patient Account Number: [id number] Date of Birth/Sex: Treating RN: 07-May-1961 (60 y.o. [name] Primary Care Provider: [name] Other Clinician: Referring Provider: Treating Provider/Extender: Morley Kos Weeks in Treatment: 11 Diagnosis Coding ICD-10 Codes Code Description E11.621 Type 2 diabetes mellitus with foot ulcer M14.672 Charcot's joint, left ankle and foot L97.528 Non-pressure chronic ulcer of other part of left foot with other specified severity M86.372 Chronic multifocal osteomyelitis, left ankle and foot Facility Procedures CPT4 Code Description Modifier Quantity [id number] G0277-(Facility Use Only) HBOT full body chamber, , 3 Physician Procedures Quantity CPT4 Code Description Modifier [id number] 99183 - WC PHYS HYPERBARIC OXYGEN THERAPY 1 ICD-10 Diagnosis Description E11.621 Type 2 diabetes mellitus with foot ulcer Electronic Signature(s) Signed: 08/24/2021 12:54:29 PM By: Zandra Abts RN, BSN Signed: 08/24/2021 3:06:40 PM By: [name] DO Entered By: Zandra Abts on 08/24/2021 12:53:29

## 2021-08-24 NOTE — Progress Notes (Signed)
[name], [name] ([id number]) Visit Report for 08/24/2021 Arrival Information Details Patient Name: Date of Service: [name] 08/24/2021 10:00 A M Medical Record Number: [id number] Patient Account Number: [id number] Date of Birth/Sex: Treating RN: [birth year] (60 y.o. [name] Primary Care Kendarious Gudino: [name] Other Clinician: Referring Corday Wyka: Treating Desman Polak/Extender: [name] in Treatment: 11 Visit Information History Since Last Visit Added or deleted any medications: No Patient Arrived: Walker Any new allergies or adverse reactions: No Arrival Time: 10:23 Had a fall or experienced change in No Accompanied By: alone activities of daily living that may affect Transfer Assistance: None risk of falls: Patient Identification Verified: Yes Signs or symptoms of abuse/neglect since last visito No Secondary Verification Process Completed: Yes Hospitalized since last visit: No Patient Requires Transmission-Based Precautions: No Implantable device outside of the clinic excluding No Patient Has Alerts: Yes cellular tissue based products placed in the center since last visit: Has Dressing in Place as Prescribed: Yes Pain Present Now: No Electronic Signature(s) Signed: 08/24/2021 12:54:29 PM By: Zandra Abts RN, BSN Entered By: Zandra Abts on 08/24/2021 12:45:45 -------------------------------------------------------------------------------- Vitals Details Patient Name: Date of Service: [name] 08/24/2021 10:00 A M Medical Record Number: [id number] Patient Account Number: [id number] Date of Birth/Sex: Treating RN: 1960-12-25 (60 y.o. [name] Primary Care Gracy Ehly: [name] Other Clinician: Referring Chiara Coltrin: Treating Laith Antonelli/Extender: Morley Kos Weeks in Treatment: 11 Vital Signs Time Taken: 10:23 Temperature (F): 97.9 Weight (lbs): 270 Pulse (bpm): 108 Respiratory Rate (breaths/min):  16 Blood Pressure (mmHg): 123/95 Capillary Blood Glucose (mg/dl): 335 Reference Range: 80 - 120 mg / dl Electronic Signature(s) Signed: 08/24/2021 12:54:29 PM By: Zandra Abts RN, BSN Entered By: Zandra Abts on 08/24/2021 12:46:09

## 2021-08-24 NOTE — Progress Notes (Signed)
[name], STAAT ([id number]) Visit Report for 08/24/2021 HBO Details Patient Name: Date of Service: [name], [name] 08/24/2021 10:00 A M Medical Record Number: [id number] Patient Account Number: [id number] Date of Birth/Sex: Treating RN: 1961/04/05 (60 y.o. Elizebeth [name] Primary Care Tosca Pletz: [name] Other Clinician: Referring Maryruth Apple: Treating Akiva Josey/Extender: [name] in Treatment: 11 HBO Treatment Course Details Treatment Course Number: 1 Ordering Boleslaw Borghi: Baltazar Najjar T Treatments Ordered: otal 40 HBO Treatment Start Date: 07/07/2021 HBO Indication: Chronic Refractory Osteomyelitis to Left,Plantar Foot HBO Treatment Details Treatment Number: 25 Patient Type: Outpatient Chamber Type: Monoplace Chamber Serial #: [id number] Treatment Protocol: 2.5 ATA with 90 minutes oxygen, with two 5 minute air breaks Treatment Details Compression Rate Down: 2.0 psi / minute De-Compression Rate Up: 2.5 psi / minute A breaks and ir Compress Tx Pressure breathing periods Decompress Decompress Begins Reached (leave unused Begins Ends spaces blank) Chamber Pressure (ATA 1 2.5 2.5 2.5 2.5 2.5 - - 2.5 1 ) Clock Time (24 hr) 10:45 10:57 11:27 11:32 - - - - 11:55 12:05 Treatment Length: 80 (minutes) Treatment Segments: 3 Vital Signs Capillary Blood Glucose Reference Range: 80 - 120 mg / dl HBO Diabetic Blood Glucose Intervention Range: <131 mg/dl or >720 mg/dl Time Vitals Blood Respiratory Capillary Blood Glucose Pulse Action Type: Pulse: Temperature: Taken: Pressure: Rate: Glucose (mg/dl): Meter #: Oximetry (%) Taken: Pre 10:23 123/95 108 16 97.9 135 Post 12:05 126/77 101 16 97.5 115 Treatment Response Adverse Events: 1:Confinement Anxiety Treatment Completion Status: Treatment Aborted/Not Restarted Reason: Adverse Event Physician HBO Attestation: I certify that I supervised this HBO treatment in accordance with Medicare guidelines. A trained  emergency response team is readily available per Yes hospital policies and procedures. Continue HBOT as ordered. Yes Electronic Signature(s) Signed: 08/24/2021 3:06:40 PM By: [name] DO Previous Signature: 08/24/2021 12:54:29 PM Version By: Zandra Abts RN, BSN Entered By: [name] on 08/24/2021 15:05:23 -------------------------------------------------------------------------------- HBO Safety Checklist Details Patient Name: Date of Service: [name] IG 08/24/2021 10:00 A M Medical Record Number: [id number] Patient Account Number: [id number] Date of Birth/Sex: Treating RN: Dec 11, 1960 (60 y.o. Elizebeth [name] Primary Care Jeaninne Lodico: [name] Other Clinician: Referring Jasenia Weilbacher: Treating Kaelon Weekes/Extender: Morley Kos Weeks in Treatment: 11 HBO Safety Checklist Items Safety Checklist Consent Form Signed Patient voided / foley secured and emptied When did you last eato 0800 Last dose of injectable or oral agent NA Ostomy pouch emptied and vented if applicable NA All implantable devices assessed, documented and approved NA Intravenous access site secured and place NA Valuables secured Linens and cotton and cotton/polyester blend (less than 51% polyester) Personal oil-based products / skin lotions / body lotions removed Wigs or hairpieces removed Smoking or tobacco materials removed Books / newspapers / magazines / loose paper removed Cologne, aftershave, perfume and deodorant removed Jewelry removed (may wrap wedding band) Make-up removed Hair care products removed Battery operated devices (external) removed Heating patches and chemical warmers removed Titanium eyewear removed Nail polish cured greater than 10 hours NA Casting material cured greater than 10 hours NA Hearing aids removed Loose dentures or partials removed Prosthetics have been removed NA Patient demonstrates correct use of air break device (if  applicable) Patient concerns have been addressed Patient grounding bracelet on and cord attached to chamber Specifics for Inpatients (complete in addition to above) Medication sheet sent with patient NA Intravenous medications needed or due during therapy sent with patient NA Drainage tubes (e.g. nasogastric tube or chest tube secured and vented) NA Endotracheal  or Tracheotomy tube secured NA Cuff deflated of air and inflated with saline NA Airway suctioned NA Electronic Signature(s) Signed: 08/24/2021 12:54:29 PM By: Zandra Abts RN, BSN Entered By: Zandra Abts on 08/24/2021 12:47:57

## 2021-08-25 ENCOUNTER — Encounter (HOSPITAL_BASED_OUTPATIENT_CLINIC_OR_DEPARTMENT_OTHER): Payer: Medicare Other | Admitting: Internal Medicine

## 2021-08-26 ENCOUNTER — Other Ambulatory Visit: Payer: Self-pay

## 2021-08-26 ENCOUNTER — Encounter (HOSPITAL_BASED_OUTPATIENT_CLINIC_OR_DEPARTMENT_OTHER): Payer: Medicare Other | Admitting: Physician Assistant

## 2021-08-26 DIAGNOSIS — E11621 Type 2 diabetes mellitus with foot ulcer: Secondary | ICD-10-CM | POA: Diagnosis not present

## 2021-08-26 NOTE — Progress Notes (Signed)
[name], [name] ([id number]) Visit Report for 08/26/2021 Arrival Information Details Patient Name: Date of Service: [name], [name] 08/26/2021 12:30 PM Medical Record Number: [id number] Patient Account Number: [id number] Date of Birth/Sex: Treating RN: Feb 17, 1961 (60 y.o. [name] Primary Care [name]: Billey Gosling Other Clinician: Referring [name]: Treating Reno Clasby/Extender: Adele Schilder in Treatment: 11 Visit Information History Since Last Visit Added or deleted any medications: No Patient Arrived: [name] Any new allergies or adverse reactions: No Arrival Time: 12:52 Had a fall or experienced change in No Accompanied By: self activities of daily living that may affect Transfer Assistance: None risk of falls: Patient Identification Verified: Yes Signs or symptoms of abuse/neglect since No Secondary Verification Process Completed: Yes last visito Patient Requires Transmission-Based Precautions: No Hospitalized since last visit: No Patient Has Alerts: Yes Implantable device outside of the clinic No excluding cellular tissue based products placed in the center since last visit: Has Dressing in Place as Prescribed: Yes Has Footwear/Offloading in Place as Yes Prescribed: Left: Surgical Shoe with Pressure Relief Insole Pain Present Now: No Electronic Signature(s) Signed: 08/26/2021 5:03:19 PM By: Baruch Gouty RN, BSN Entered By: Baruch Gouty on 08/26/2021 12:56:23 -------------------------------------------------------------------------------- Encounter Discharge Information Details Patient Name: Date of Service: [name] IG 08/26/2021 12:30 PM Medical Record Number: [id number] Patient Account Number: [id number] Date of Birth/Sex: Treating RN: 1960-10-19 (60 y.o. [name] Primary Care [name]: Billey Gosling Other Clinician: Referring Shabre Kreher: Treating [name]/Extender: Adele Schilder in Treatment:  11 Encounter Discharge Information Items Post Procedure Vitals Discharge Condition: Stable Temperature (F): 97.5 Ambulatory Status: Walker Pulse (bpm): 103 Discharge Destination: Home Respiratory Rate (breaths/min): 22 Transportation: Private Auto Blood Pressure (mmHg): 148/88 Accompanied By: self Schedule Follow-up Appointment: Yes Clinical Summary of Care: Patient Declined Electronic Signature(s) Signed: 08/26/2021 5:03:19 PM By: Baruch Gouty RN, BSN Entered By: Baruch Gouty on 08/26/2021 13:33:40 -------------------------------------------------------------------------------- Lower Extremity Assessment Details Patient Name: Date of Service: [name] IG 08/26/2021 12:30 PM Medical Record Number: [id number] Patient Account Number: [id number] Date of Birth/Sex: Treating RN: 10-18-1961 (60 y.o. [name] Primary Care [name]: Billey Gosling Other Clinician: Referring [name]: Treating [name]/Extender: Landis Martins Weeks in Treatment: 11 Edema Assessment Assessed: [Left: No] [Right: No] Edema: [Left: Ye] [Right: s] Calf Left: Right: Point of Measurement: 36 cm From Medial Instep 41.5 cm Ankle Left: Right: Point of Measurement: 13 cm From Medial Instep 31.5 cm Vascular Assessment Pulses: Dorsalis Pedis Palpable: [Left:Yes] Electronic Signature(s) Signed: 08/26/2021 5:03:19 PM By: Baruch Gouty RN, BSN Entered By: Baruch Gouty on 08/26/2021 13:01:17 -------------------------------------------------------------------------------- Multi-Disciplinary Care Plan Details Patient Name: Date of Service: [name] IG 08/26/2021 12:30 PM Medical Record Number: [id number] Patient Account Number: [id number] Date of Birth/Sex: Treating RN: Sep 22, 1961 (60 y.o. [name] Primary Care [name]: Billey Gosling Other Clinician: Referring [name]: Treating [name]/Extender: Adele Schilder in Treatment:  11 Multidisciplinary Care Plan reviewed with physician Active Inactive HBO Nursing Diagnoses: Anxiety related to feelings of confinement associated with the hyperbaric oxygen chamber Anxiety related to knowledge deficit of hyperbaric oxygen therapy and treatment procedures Discomfort related to temperature and humidity changes inside hyperbaric chamber Potential for barotraumas to ears, sinuses, teeth, and lungs or cerebral gas embolism related to changes in atmospheric pressure inside hyperbaric oxygen chamber Potential for oxygen toxicity seizures related to delivery of 100% oxygen at an increased atmospheric pressure Potential for pulmonary oxygen toxicity related to delivery of 100% oxygen at an increased atmospheric pressure Goals: Barotrauma will  be prevented during HBO2 Date Initiated: 06/04/2021 Target Resolution Date: 09/10/2021 Goal Status: Active Patient and/or family will be able to state/discuss factors appropriate to the management of their disease process during treatment Date Initiated: 06/04/2021 Date Inactivated: 08/04/2021 Target Resolution Date: 08/14/2021 Goal Status: Met Patient will tolerate the hyperbaric oxygen therapy treatment Date Initiated: 06/04/2021 Target Resolution Date: 09/19/2021 Goal Status: Active Patient will tolerate the internal climate of the chamber Date Initiated: 06/04/2021 Target Resolution Date: 09/19/2021 Goal Status: Active Patient/caregiver will verbalize understanding of HBO goals, rationale, procedures and potential hazards Date Initiated: 06/04/2021 Date Inactivated: 07/28/2021 Target Resolution Date: 08/14/2021 Goal Status: Met Signs and symptoms of pulmonary oxygen toxicity will be recognized and promptly addressed Date Initiated: 06/04/2021 Target Resolution Date: 09/19/2021 Goal Status: Active Signs and symptoms of seizure will be recognized and promptly addressed ; seizing patients will suffer no harm Date Initiated:  06/04/2021 Target Resolution Date: 09/19/2021 Goal Status: Active Interventions: Administer a five (5) minute air break for patient if signs and symptoms of seizure appear and notify the hyperbaric physician Administer decongestants, per physician orders, prior to HBO2 Administer the correct therapeutic gas delivery based on the patients needs and limitations, per physician order Assess and provide for patients comfort related to the hyperbaric environment and equalization of middle ear Assess for signs and symptoms related to adverse events, including but not limited to confinement anxiety, pneumothorax, oxygen toxicity and baurotrauma Assess patient for any history of confinement anxiety Assess patient's knowledge and expectations regarding hyperbaric medicine and provide education related to the hyperbaric environment, goals of treatment and prevention of adverse events Implement protocols to decrease risk of pneumothorax in high risk patients Notes: Nutrition Nursing Diagnoses: Impaired glucose control: actual or potential Potential for alteratiion in Nutrition/Potential for imbalanced nutrition Goals: Patient/caregiver agrees to and verbalizes understanding of need to use nutritional supplements and/or vitamins as prescribed Date Initiated: 06/04/2021 Date Inactivated: 07/07/2021 Target Resolution Date: 07/04/2021 Goal Status: Met Patient/caregiver will maintain therapeutic glucose control Date Initiated: 06/04/2021 Target Resolution Date: 09/19/2021 Goal Status: Active Interventions: Assess HgA1c results as ordered upon admission and as needed Assess patient nutrition upon admission and as needed per policy Provide education on elevated blood sugars and impact on wound healing Notes: 07/07/21: Glucose control ongoing Electronic Signature(s) Signed: 08/26/2021 5:03:19 PM By: Baruch Gouty RN, BSN Entered By: Baruch Gouty on 08/26/2021  13:05:53 -------------------------------------------------------------------------------- Pain Assessment Details Patient Name: Date of Service: IRINEO, GAULIN IG 08/26/2021 12:30 PM Medical Record Number: [id number] Patient Account Number: [id number] Date of Birth/Sex: Treating RN: May 30, 1961 (60 y.o. [name] Primary Care Ameris Akamine: Other Clinician: Billey Gosling Referring Zipporah Finamore: Treating Calixto Pavel/Extender: Adele Schilder in Treatment: 11 Active Problems Location of Pain Severity and Description of Pain Patient Has Paino No Site Locations Rate the pain. Current Pain Level: 0 Pain Management and Medication Current Pain Management: Electronic Signature(s) Signed: 08/26/2021 5:03:19 PM By: Baruch Gouty RN, BSN Entered By: Baruch Gouty on 08/26/2021 13:00:38 -------------------------------------------------------------------------------- Patient/Caregiver Education Details Patient Name: Date of Service: Emelia Salisbury 11/9/2022andnbsp12:30 PM Medical Record Number: [id number] Patient Account Number: [id number] Date of Birth/Gender: Treating RN: December 11, 1960 (60 y.o. [name] Primary Care Physician: Billey Gosling Other Clinician: Referring Physician: Treating Physician/Extender: Adele Schilder in Treatment: 11 Education Assessment Education Provided To: Patient Education Topics Provided Elevated Blood Sugar/ Impact on Healing: Methods: Explain/Verbal Responses: Reinforcements needed, State content correctly Offloading: Methods: Explain/Verbal Responses: Reinforcements needed, State content correctly Wound/Skin Impairment: Methods: Explain/Verbal Responses: Reinforcements needed,  State content correctly Electronic Signature(s) Signed: 08/26/2021 5:03:19 PM By: Baruch Gouty RN, BSN Entered By: Baruch Gouty on 08/26/2021  13:07:03 -------------------------------------------------------------------------------- Wound Assessment Details Patient Name: Date of Service: SHAHEED, SCHMUCK IG 08/26/2021 12:30 PM Medical Record Number: [id number] Patient Account Number: [id number] Date of Birth/Sex: Treating RN: 08-15-1961 (60 y.o. [name] Primary Care [name] [name]: Billey Gosling Other Clinician: Referring Barnabas Henriques: Treating Bernetha Anschutz/Extender: Landis Martins Weeks in Treatment: 11 Wound Status Wound Number: 3 Primary Diabetic Wound/Ulcer of the Lower Extremity Etiology: Wound Location: Left, Plantar Foot Wound Open Wounding Event: Gradually Appeared Status: Date Acquired: 03/18/2021 Comorbid Anemia, Sleep Apnea, Congestive Heart Failure, Hypertension, Weeks Of Treatment: 11 History: Peripheral Venous Disease, Cirrhosis , Type II Diabetes, Clustered Wound: No Osteoarthritis, Neuropathy Photos Wound Measurements Length: (cm) 4.9 Width: (cm) 4.2 Depth: (cm) 0.3 Area: (cm) 16.163 Volume: (cm) 4.849 % Reduction in Area: -128.6% % Reduction in Volume: -585.9% Epithelialization: Small (1-33%) Tunneling: No Undermining: No Wound Description Classification: Grade 3 Wound Margin: Thickened Exudate Amount: Large Exudate Type: Serosanguineous Exudate Color: red, brown Foul Odor After Cleansing: No Slough/Fibrino Yes Wound Bed Granulation Amount: Large (67-100%) Exposed Structure Granulation Quality: Red Fascia Exposed: No Necrotic Amount: None Present (0%) Fat Layer (Subcutaneous Tissue) Exposed: Yes Tendon Exposed: No Muscle Exposed: No Joint Exposed: No Bone Exposed: No Treatment Notes Wound #3 (Foot) Wound Laterality: Plantar, Left Cleanser Soap and Water Discharge Instruction: May shower and wash wound with dial antibacterial soap and water prior to dressing change. Wound Cleanser Discharge Instruction: Cleanse the wound with wound cleanser prior to applying a clean dressing  using gauze sponges, not tissue or cotton balls. Peri-Wound Care Topical Primary Dressing KerraCel Ag Gelling Fiber Dressing, 4x5 in (silver alginate) Discharge Instruction: Apply silver alginate to wound bed as instructed Secondary Dressing ABD Pad, 5x9 Discharge Instruction: Apply over primary dressing as directed. Zetuvit Plus 4x8 in Discharge Instruction: Apply over primary dressing as directed. Secured With [employer], 4.5x3.1 (in/yd) Discharge Instruction: Secure with Kerlix as directed. 47M Medipore H Soft Cloth Surgical T ape, 2x2 (in/yd) Discharge Instruction: Secure dressing with tape as directed. Compression Wrap Compression Stockings Circaid Juxta Lite Compression Wrap Quantity: 1 Left Leg Compression Amount: 30-40 mmHg Discharge Instruction: Apply Circaid Juxta Lite Compression Wrap daily as instructed. Apply first thing in the morning, remove at night before bed. Add-Ons Electronic Signature(s) Signed: 08/26/2021 5:03:19 PM By: Baruch Gouty RN, BSN Entered By: Baruch Gouty on 08/26/2021 13:07:34 -------------------------------------------------------------------------------- [place] Details Patient Name: Date of Service: GRIFF, BADLEY IG 08/26/2021 12:30 PM Medical Record Number: [id number] Patient Account Number: [id number] Date of Birth/Sex: Treating RN: 02/16/1961 (60 y.o. [name] Primary Care Jaquil Todt: Billey Gosling Other Clinician: Referring Cally Nygard: Treating Caileb Rhue/Extender: Adele Schilder in Treatment: 11 Vital Signs Time Taken: 12:56 Temperature (F): 97.5 Weight (lbs): 270 Pulse (bpm): 103 Respiratory Rate (breaths/min): 22 Blood Pressure (mmHg): 148/88 Reference Range: 80 - 120 mg / dl Notes pt did not check his blood sugar at home Electronic Signature(s) Signed: 08/26/2021 5:03:19 PM By: Baruch Gouty RN, BSN Entered By: Baruch Gouty on 08/26/2021 12:57:15

## 2021-08-26 NOTE — Progress Notes (Addendum)
[name], [name] ([id number]) Visit Report for 08/26/2021 Chief Complaint Document Details Patient Name: Date of Service: [name] 08/26/2021 12:30 PM Medical Record Number: [id number] Patient Account Number: [id number] Date of Birth/Sex: Treating RN: 08-08-1961 (60 y.o. [name] Primary Care Provider: Billey Gosling Other Clinician: Referring Provider: Treating Provider/Extender: Adele Schilder in Treatment: 11 Information Obtained from: Patient Chief Complaint Bilateral foot diabetic ulcers Electronic Signature(s) Signed: 08/26/2021 1:11:12 PM By: Worthy Keeler PA-C Entered By: Worthy Keeler on 08/26/2021 13:11:11 -------------------------------------------------------------------------------- Debridement Details Patient Name: Date of Service: [name], [name] IG 08/26/2021 12:30 PM Medical Record Number: [id number] Patient Account Number: [id number] Date of Birth/Sex: Treating RN: 1960/12/10 (60 y.o. [name] Primary Care Provider: Billey Gosling Other Clinician: Referring Provider: Treating Provider/Extender: Adele Schilder in Treatment: 11 Debridement Performed for Assessment: Wound #3 Left,Plantar Foot Performed By: Physician Worthy Keeler, PA Debridement Type: Debridement Severity of Tissue Pre Debridement: Fat layer exposed Level of Consciousness (Pre-procedure): Awake and Alert Pre-procedure Verification/Time Out Yes - 13:20 Taken: Start Time: 13:23 Pain Control: Other : Benzocaine 20% T Area Debrided (L x W): otal 5 (cm) x 4.5 (cm) = 22.5 (cm) Tissue and other material debrided: Viable, Non-Viable, Callus, Slough, Subcutaneous, Skin: Epidermis, Slough Level: Skin/Subcutaneous Tissue Debridement Description: Excisional Instrument: Curette Bleeding: Minimum Hemostasis Achieved: Pressure Procedural Pain: 0 Post Procedural Pain: 0 Response to Treatment: Procedure was tolerated well Level of Consciousness  (Post- Awake and Alert procedure): Post Debridement Measurements of Total Wound Length: (cm) 4.9 Width: (cm) 4.2 Depth: (cm) 0.3 Volume: (cm) 4.849 Character of Wound/Ulcer Post Debridement: Improved Severity of Tissue Post Debridement: Fat layer exposed Post Procedure Diagnosis Same as Pre-procedure Electronic Signature(s) Signed: 08/26/2021 5:03:19 PM By: Baruch Gouty RN, BSN Signed: 09/03/2021 5:50:22 PM By: Worthy Keeler PA-C Entered By: Baruch Gouty on 08/26/2021 13:25:45 -------------------------------------------------------------------------------- HPI Details Patient Name: Date of Service: [name] IG 08/26/2021 12:30 PM Medical Record Number: [id number] Patient Account Number: [id number] Date of Birth/Sex: Treating RN: 11-Sep-1961 (60 y.o. [name] Primary Care Provider: Billey Gosling Other Clinician: Referring Provider: Treating Provider/Extender: Adele Schilder in Treatment: 11 History of Present Illness HPI Description: 10/31/2019 upon evaluation today patient appears to be doing somewhat poorly in regard to his bilateral plantar feet. He has wounds that he tells me have been present since 2012 intermittently off and on. Most recently this has been open for at least the past 6 months to a year. He has been trying to treat this in different ways using Santyl along with various other dressings including Medihoney and even at one point Xeroform. Nothing really has seem to get this completely closed. He was recently in the hospital for cellulitis of his leg subsequently he did have x-rays as well as MRIs that showed negative for any signs of osteomyelitis in regard to the wounds on his feet. Fortunately there is no signs of systemic infection at this time. No fevers, chills, nausea, vomiting, or diarrhea. Patient has previously used Darco offloading shoes as far as frontal floaters as well as postop surgical shoes. He has never been in  a total contact cast that may be something we need to strongly consider here. Patient's most recent hemoglobin A1c 1 month ago was 5.3 seems to be very well controlled which is great. Subsequently he has seen vascular as well as podiatry. His ABIs are 1.07 on the left and 1.14 on the right he seems to be  doing well he does have chronic venous stasis. 11/07/2019 upon evaluation today patient appears to be doing well with regard to his wounds all things considered. I do not see any severe worsening he still has some callus buildup on the right more than the left he notes that he has been probably more active than he should as far as walking is concerned is just very hard to not be active. He knows he needs to be more careful in this regard however. He is willing to give the cast a try at this point although he notes that he is a little nervous about this just with regard to balance although he will be very careful and obviously if he has any trouble he knows to contact the office and let me know. 1/22; patient is in for his obligatory first total contact cast change. Our intake nurse reported a very large amount of drainage which is spelled out over to the surrounding skin. Has bilateral diabetic foot wounds. He has Charcot feet. We have been using silver alginate on his wounds. 11/14/2019 on evaluation today patient is actually seeming to make good improvement in regard to his bilateral plantar foot wounds. We have been using a cast on the left side and on the right side he has been using dressings he is changing up his own accord. With that being said he tells me that he is also not walking as much just due to how unsteady he feels. He takes it easy when he does have to walk and when he does not have to walk he is resting. This is probably help in his right foot as well has the left foot which is actually measuring better. In fact both are measuring better. Overall I am very pleased with how things seem  to be progressing. The patient does have some odor on the left foot this does have me concerned about the possibility of infection, and actually probably go ahead and put him on antibiotics today as well as utilizing a continuation of the cast on the left foot I think that will be fine we probably just need to bring him in sooner to change this not last a whole week. 1/29; we brought the patient back today for a total contact cast change on the left out of concern for excessive drainage. We are using drawtex over the wound as the primary dressing 11/21/2019 on evaluation today patient appears to be doing well with regard to his left plantar foot. In fact both foot ulcers actually seem to be doing pretty well. Nonetheless he is having a lot of drainage on the left at this time and again we did obtain a wound culture did show positive for Staph aureus that was reviewed by myself today as well. Nonetheless he is on Bactrim which was shown to be sensitive that should be helping in this regard. Fortunately there is no signs of infection systemically at this point. 2/5; back in clinic today for a total contact cast change apparently secondary to very significant drainage. Still using drawtex 11/28/2019 upon evaluation today patient appears to be doing well with regard to his wounds. The right foot is doing okay as measured about the same in my opinion. The left foot is actually showing signs of significant improvement is measuring smaller there is a lot of hyper granulation likely due to the continued drainage at this point. We did obtain approval for a snap VAC I think that is good to be appropriate  for him and will likely help this tremendously underneath the cast. He is definitely in agreement with proceeding with such. 2/12; patient came in today after his snap VAC lost suction. Brought in to see one of our nurses. The dressing was replaced and then we put the cast back on and rehooked up the snap VAC.  Apparently his wound looked very good per our intake nurse. 2/15; again we replaced the cast on Friday. By Saturday the snap VAC and light suction. He called this morning he comes in acutely. The wounds look fine however the VAC is not functioning. We replaced the cast using silver alginate as the primary dressing backed with Kerramax. The snap VAC was not replaced 12/05/2019 upon evaluation today patient appears to be doing better in regard to his left plantar foot ulcer. Fortunately there is no signs of active infection at this time. Unfortunately he is continuing to have issues with the right foot he is really not making any progress here things seem to be somewhat stagnant to be honest. The depth has increased but that is due to me having debrided the wound in the past based on what I am seeing. 12/12/2019 on evaluation today patient appears to be doing more poorly in regard to the left lower extremity. He has some erythema spreading up the side of his foot I am concerned about infection again at this point. Unfortunately he has been seeing improvement with a total contact cast but I do not think we should put that on today. On his right plantar foot he continues to have significant drainage this is actually measuring deeper I really do not feel like you are making any progress whatsoever. I have prescribed Granix for him unfortunately his insurance apparently was going to cost him a $500 co-pay. 12/19/2019 upon evaluation today patient actually appears to be doing better in regard to both wounds. With that being said he actually did get the reGranix which he had to pay $500 for. With that being said it does look like that he is actually made some improvement based on what I am seeing at this point with the reGranix. Obviously if he is going to continue this we are going to do something about trying to get him some help in covering the cost. 12/26/2019 on evaluation today patient appears to be doing  really much better even compared to last week. Overall the wound seems to be much better even compared to last week and last week was better than the week before. Since has been using the reGranix his symptoms have improved significantly. With that being said the issue right now is simply that this is a very expensive medication for him the first dose cost him $500. Upon inspection patient's wound bed actually is however dramatically improved compared to before he started this 2 weeks ago. 01/02/2020 upon evaluation today patient appears to actually be doing well. He still had a little bit of reGranix left that has been using in small amounts he just been applying it every other day instead of every day in order to make it go longer. Overall we are still seeing excellent improvement he is measuring smaller looking better healthier tissue and everything seems to be pointing to this headed in the right direction. Fortunately there is no evidence of infection either which is also excellent news. He does have his MRI coming up within the next week. 01/09/2020 upon evaluation today patient appears to be doing a little worse today compared  to previous week's evaluation. He is actually been out of the reGranix at this point. He has been trying to make the stretch out so he has been changing the dressings on a regular set schedule like he was previous. I think this has made a difference. Fortunately there is no signs of active infection at this time. No fevers, chills, nausea, vomiting, or diarrhea. 01/16/2020 upon evaluation today patient appears to be doing well with regard to his left plantar foot ulcer. The right plantar foot still shows some significant depth at this point. Fortunately there is no signs of active infection at this time. 01/30/2020 upon evaluation today patient appears to be doing about the same in regard to his right plantar foot ulcer there is still some depth here and we had to wait till he  actually switched over to his new insurance to get approval for the MRI under his new insurance plan. With that being said he now has switched as of April 1. Fortunately there is no signs of active infection at this time. Overall in regard to his left foot ulcer this seems to be doing much better and I am actually very pleased with how things are going. With that being said it is not quite as much progress as we were seeing with the reGranix but at the same time he has had trouble getting this apparently there is been some hindrance here. I Ernie Hew try to actually send this to [facility] that was recommended by the drug rep to me. 02/13/20 upon evaluation today patient appears to be doing better in regard to his left foot ulcer this is great news. Unfortunately the right foot ulcer is not really significantly better at this time. There is no signs of active infection systemically though he did have his MRI which showed unfortunately he does have infection noted including an abscess in the foot. There is also marrow changes noted which are consistent with osteomyelitis based on the radiology review and interpretation. Unfortunately considering that the wound is not really making the progress that we will he would like to be seen I think that this is an indication that he may need some further referral both infectious disease as well as potentially to podiatrist to see if there is anything that can be done to help with the situation that were dealing with here. The left foot again is doing great. READMISSION 06/04/2021 This is a 60 year old man who was in the clinic in 2021 followed by Allen Derry for areas on the right and left foot. He developed a left foot infection and was referred to ID. He left the clinic in a nonhealed state and was followed for a period of [facility] Marylene Land. Apparently things really deteriorated in early July when he was admitted to hospital from 04/27/2021  through 05/06/2021 with sepsis secondary to a left foot infection. His blood cultures were negative. An MRI suggested fifth metatarsal osteomyelitis a left ankle septic joint. He was treated with vancomycin and ertapenem which he is still taking and may just about be finishing. He was seen by orthopedics and the patient adamantly refused to BKA. As far as I can tell he did not have the ankle aspirated I am not exactly sure what the issue was here. Since he has been discharged she is at [facility] for rehabilitation. He was last seen by Dr. Algis Liming on 05/20/2021 he noted osteo of the tibial talar bone cuboid and fifth metatarsal  which is even more extensive than what was suggested by the MRI. He is apparently going for a consultation with orthopedic surgery in [place] sometime next week. I received a call about this man 2 weeks ago from Dr. Gwenlyn Found who follows him for the possibility of PAD. ABIs I think done in the office showed a ABI on the right of 1.05 at the PTA and 0.99 at the PTA on the left. He had a DVT rule out in the left leg that was negative for the DVT. Past medical history is extensive and includes diastolic heart failure, right first metatarsal head ulcer in 2021, excision of the right second ray by Dr. Cannon Kettle on 03/13/2020, hypertension, hypothyroidism. Left total hip replacement, right total knee replacement, carpal tunnel syndrome, obstructive sleep apnea alcohol abuse with cirrhosis although the patient denies current alcohol intake. The patient does not think he is a diabetic however looking through [place] link I see 2 HgbAic's of this year that were greater than equal to 6.5 which by definition makes him diabetic. Nevertheless he is not on any treatment and does not check his blood sugars. The patient is now back home out of the nursing home. Saw Dr. Drucilla Schmidt last week he was taken off vancomycin and ertapenem on August 22 and now is on doxycycline on Augmentin. He also saw  Dr. Buford Dresser who is his orthopedic surgeon in [place] he recommended a [employer]. He has been using Medihoney. 9/6I have been having trouble getting hyperbarics approved through our prior authorization process. Even though he had a limb threatening infection in the left foot and probably the left ankle there glitches in how some of the reports are worded also some of the consultants. In any case I am going to repeat his sedimentation rate and C-reactive protein. I am generally not in favor of doing things like this as they really do not alter the plan of care from my point of view however I am going to need to demonstrate that these remain high in order to get this through forhyperbaric treatment for chronic refractory osteomyelitis 9/13; following this man for a wound on his left plantar foot in the setting of type 2 diabetes and Charcot deformity. He has underlying chronic refractory osteomyelitis. Follow-up sedimentation rate and C-reactive protein were both elevated but the C-reactive protein was down to 1.4, sedimentation rate at 70. Sedimentation rate was only slightly down from previous at 85. His wound is measuring slightly smaller. 9/20; patient started hyperbaric oxygen therapy today and tolerated treatment well. This is for the underlying osteomyelitis. He remains on antibiotics but thinks he is getting close to finishing. The wound on the plantar aspect of his foot is the other issue we are following here. He is using Medihoney The patient has a Charcot foot in the setting of type 2 diabetes. He is going to need a total contact cast although his partner was away this week and we elected to delay this till next week 9/27; patient still tolerating hyperbaric oxygen well. Wound looked generally healthy not much depth under illumination still 100% covered in fibrinous debris. Raised callused edges around the wound he was prepared for a total contact cast. We have been using Medihoney 9/30;  patient is back for his first obligatory total contact cast change. We are using Hydrofera Blue. Noted by our intake nurse to have a lot of drainage or at least a moderate amount of drainage. I am not sure I was previously aware of this  10/4; patient arrives today with a lot of drainage under the cast. When he had it changed last Friday there was also a similar amount of drainage. Her intake nurse says that they tried a wound VAC on him perhaps while I was on vacation in August under the cast but that did not work. In my experience that has not been unusual. We have been using Hydrofera Blue with all the secondary absorbers. The drainage today went right through all of our dressings. The patient is concerned about his foot being in a cast without much drainage. He is tolerating HBO well. There has been improvements in the wound in the mid part of his foot in the setting of a Charcot deformity 10/7; patient presents for cast change. He has no issues or complaints today. He denies signs of infection. 10/11; patient presents for cast change. At this time he would like to take a break from the cast. He would like to do daily dressing changes with Hydrofera Blue. He currently denies signs of infection. 10/14; his cast was taken off last week at his request. He arrives in the clinic with [facility] He has been changing the dressing himself. He has way too much edema in the left foot and leg to consider a total contact cast. I do not really know the issue here. He does have chronic venous insufficiency His wife stopped me in the clinic earlier in the week to report he is drinking again and she is concerned. I am uncertain whether there are other issues 10/18; he arrived in clinic last week having bilateral lower extremity edema likely secondary to chronic venous insufficiency there was too much edema in the left leg to apply a total contact cast. I put him in compression on the left leg to control the  swelling. This week he cut the wrap on the foot for reasons that are unclear however today he arrives in clinic with a smaller left leg but massive edema in the left foot. He is supposed to be wearing a juxta lite stocking on the right leg but he is not wearing the contact layer. His attendance at hyperbaric oxygen has been dwindling, he did not dive yesterday and he did not dive today concerned about hyperbarics causing swelling. I looked back in his record his last echo was in 2020 essentially normal left and right ventricular function. Last BUN and creatinine were done in July this was normal. He is having a lot of drainage in the left plantar foot wound 10/25; again he comes in today having missed HBO yesterday. Macerated skin around the wound which really does not look very good at all. A lot of edema in the left foot but an improvement in edema on the left leg. We wrapped him last week because of the amount of swelling in the left leg. We could not apply a total contact cast. The patient states that he wants to be able to change his dressing himself. I might consider this if he had stockings to control the swelling. He said he be here for HBO tomorrow. I will check the degree of erythema in his forefoot which I have marked. He is on doxycycline and Augmentin which was renewed by Dr. Drucilla Schmidt but I cannot see a follow-up note, follow-up inflammatory markers etc. I 1 point he said he was going to see his orthopedic surgeon in [place] I am not sure if he ever did this. I do not know why he has not  followed up with infectious disease, he says he was not given an appointment but he is still on Augmentin doxycycline 11/1; he did not do the lab work I ordered last week. Still on Augmentin and doxycycline. Silver alginate and he is changing this daily using his own juxta lite stocking. The surface of the wound does not look too bad. No real epithelialization however. The patient was seen today along with  HBO 08/26/2021 upon evaluation today patient is being seen at his request by myself he wanted to transfer care to see me. With that being said he has been seeing Dr. Dellia Nims since he came back in August of this year. Unfortunately he tells me he still having quite a bit of drainage. He is also significant erythema and warmth noted of the foot as well. He has been hit or miss with regard to hyperbaric oxygen therapy tells me that at this point he took this week off because he was extremely claustrophobic and having a lot of issues he plans to start back next week. Nonetheless he has been given some medication by Dr. Dellia Nims as well to help calm things down which again may help him. Hopefully he can get back in hyperbarics as I think this is likely necessary. Nonetheless there does seem to be evidence of cellulitis noted today as well and in general I am a little concerned in that regard. His wound unfortunately is significant on this left foot and is right where he takes the brunt of the force secondary to the Charcot arthropathy. I do not think a total contact cast is ideal due to the fact that he unfortunately is draining much too significantly he is also not happy with the idea of using a cast therefore he states he would not want to do that anyway. Electronic Signature(s) Signed: 08/26/2021 1:42:07 PM By: Worthy Keeler PA-C Entered By: Worthy Keeler on 08/26/2021 13:42:06 -------------------------------------------------------------------------------- Physical Exam Details Patient Name: Date of Service: [name], [name] IG 08/26/2021 12:30 PM Medical Record Number: [id number] Patient Account Number: [id number] Date of Birth/Sex: Treating RN: 29-May-1961 (60 y.o. [name] Primary Care Provider: Billey Gosling Other Clinician: Referring Provider: Treating Provider/Extender: Landis Martins Weeks in Treatment: 49 Constitutional Well-nourished and well-hydrated in no acute  distress. Respiratory normal breathing without difficulty. Psychiatric this patient is able to make decisions and demonstrates good insight into disease process. Alert and Oriented x 3. pleasant and cooperative. Notes Upon inspection patient's wound bed actually showed signs of good granulation and epithelization at this point in some areas he did have some slough and biofilm on the surface of the wound I did perform debridement around the edges to clear away the callus and on the center surface to clear away some of the slough and biofilm. He tolerated this debridement without any pain or complication postdebridement this appears to be doing better. Electronic Signature(s) Signed: 08/26/2021 1:42:30 PM By: Worthy Keeler PA-C Entered By: Worthy Keeler on 08/26/2021 13:42:30 -------------------------------------------------------------------------------- Physician Orders Details Patient Name: Date of Service: KASI, DAIN IG 08/26/2021 12:30 PM Medical Record Number: [id number] Patient Account Number: [id number] Date of Birth/Sex: Treating RN: [birth year] (60 y.o. [name] Primary Care Provider: Billey Gosling Other Clinician: Referring Provider: Treating Provider/Extender: Adele Schilder in Treatment: [phone] Verbal / Phone Orders: No Diagnosis Coding ICD-10 Coding Code Description E11.621 Type 2 diabetes mellitus with foot ulcer M14.672 Charcot's joint, left ankle and foot L97.528 Non-pressure chronic ulcer of other  part of left foot with other specified severity M86.372 Chronic multifocal osteomyelitis, left ankle and foot Follow-up Appointments ppointment in 1 week. - with Margarita Grizzle Return A Bathing/ Shower/ Hygiene May shower and wash wound with soap and water. - prior to dressing change Edema Control - Lymphedema / SCD / Other Elevate legs to the level of the heart or above for 30 minutes daily and/or when sitting, a frequency of: - throughout the day Avoid  standing for long periods of time. Exercise regularly Moisturize legs daily. Compression stocking or Garment 30-40 mm/Hg pressure to: - wear the juxtalite HD to right and left leg. apply in the morning and remove at night. Off-Loading Open toe surgical shoe to: - left foot Other: - minimal weight bearing left foot Additional Orders / Instructions Follow Nutritious Diet Hyperbaric Oxygen Therapy Wound #3 Left,Plantar Foot Evaluate for HBO Therapy - Starts 07/07/21 Indication: - Chronic Refractory Osteomyelitis 2.5 ATA for 90 Minutes with 2 Five (5) Minute A Breaks ir Total Number of Treatments: - 40 One treatments per day (delivered Monday through Friday unless otherwise specified in Special Instructions below): Finger stick Blood Glucose Pre- and Post- HBOT Treatment. Follow Hyperbaric Oxygen Glycemia Protocol Afrin (Oxymetazoline HCL) 0.05% nasal spray - 1 spray in both nostrils daily as needed prior to HBO treatment for difficulty clearing ears Wound Treatment Wound #3 - Foot Wound Laterality: Plantar, Left Cleanser: Soap and Water 1 x Per Day/15 Days Discharge Instructions: May shower and wash wound with dial antibacterial soap and water prior to dressing change. Cleanser: Wound Cleanser (Generic) 1 x Per Day/15 Days Discharge Instructions: Cleanse the wound with wound cleanser prior to applying a clean dressing using gauze sponges, not tissue or cotton balls. Prim Dressing: KerraCel Ag Gelling Fiber Dressing, 4x5 in (silver alginate) (Generic) 1 x Per Day/15 Days ary Discharge Instructions: Apply silver alginate to wound bed as instructed Secondary Dressing: ABD Pad, 5x9 (Generic) 1 x Per Day/15 Days Discharge Instructions: Apply over primary dressing as directed. Secondary Dressing: Zetuvit Plus 4x8 in (Generic) 1 x Per Day/15 Days Discharge Instructions: Apply over primary dressing as directed. Secured With: [employer], 4.5x3.1 (in/yd) (Generic) 1 x Per Day/15  Days Discharge Instructions: Secure with Kerlix as directed. Secured With: 20M Medipore H Soft Cloth Surgical Tape, 2x2 (in/yd) (Generic) 1 x Per Day/15 Days Discharge Instructions: Secure dressing with tape as directed. Compression Stockings: Circaid Juxta Lite Compression Wrap Left Leg Compression Amount: 30-40 mmHG Discharge Instructions: Apply Circaid Juxta Lite Compression Wrap daily as instructed. Apply first thing in the morning, remove at night before bed. Patient Medications llergies: Claritin A Notifications Medication Indication Start End prior to debridement 08/26/2021 benzocaine DOSE topical 20 % aerosol - aerosol topical GLYCEMIA INTERVENTIONS PROTOCOL PRE-HBO GLYCEMIA INTERVENTIONS ACTION INTERVENTION Obtain pre-HBO capillary blood glucose (ensure 1 physician order is in chart). A. Notify HBO physician and await physician orders. 2 If result is 70 mg/dl or below: B. If the result meets the hospital definition of a critical result, follow hospital policy. A. Give patient an 8 ounce Glucerna Shake, an 8 ounce Ensure, or 8 ounces of a Glucerna/Ensure equivalent dietary supplement*. B. Wait 30 minutes. If result is 71 mg/dl to 130 mg/dl: C. Retest patients capillary blood glucose (CBG). D. If result greater than or equal to 110 mg/dl, proceed with HBO. If result less than 110 mg/dl, notify HBO physician and consider holding HBO. If result is 131 mg/dl to 249 mg/dl: A. Proceed with HBO. A. Notify HBO physician and  await physician orders. B. It is recommended to hold HBO and do If result is 250 mg/dl or greater: blood/urine ketone testing. C. If the result meets the hospital definition of a critical result, follow hospital policy. POST-HBO GLYCEMIA INTERVENTIONS ACTION INTERVENTION Obtain post HBO capillary blood glucose (ensure 1 physician order is in chart). A. Notify HBO physician and await physician orders. 2 If result is 70 mg/dl or below: B. If  the result meets the hospital definition of a critical result, follow hospital policy. A. Give patient an 8 ounce Glucerna Shake, an 8 ounce Ensure, or 8 ounces of a Glucerna/Ensure equivalent dietary supplement*. B. Wait 15 minutes for symptoms of If result is 71 mg/dl to 100 mg/dl: hypoglycemia (i.e. nervousness, anxiety, sweating, chills, clamminess, irritability, confusion, tachycardia or dizziness). C. If patient asymptomatic, discharge patient. If patient symptomatic, repeat capillary blood glucose (CBG) and notify HBO physician. If result is 101 mg/dl to 249 mg/dl: A. Discharge patient. A. Notify HBO physician and await physician orders. B. It is recommended to do blood/urine ketone If result is 250 mg/dl or greater: testing. C. If the result meets the hospital definition of a critical result, follow hospital policy. *Juice or candies are NOT equivalent products. If patient refuses the Glucerna or Ensure, please consult the hospital dietitian for an appropriate substitute. Electronic Signature(s) Signed: 08/26/2021 5:03:19 PM By: Baruch Gouty RN, BSN Signed: 09/03/2021 5:50:22 PM By: Worthy Keeler PA-C Entered By: Baruch Gouty on 08/26/2021 13:31:07 -------------------------------------------------------------------------------- Problem List Details Patient Name: Date of Service: TELESFORO, CAMPE IG 08/26/2021 12:30 PM Medical Record Number: [id number] Patient Account Number: [id number] Date of Birth/Sex: Treating RN: 05-09-1961 (60 y.o. [name] Primary Care Provider: Billey Gosling Other Clinician: Referring Provider: Treating Provider/Extender: Adele Schilder in Treatment: 11 Active Problems ICD-10 Encounter Code Description Active Date MDM Diagnosis E11.621 Type 2 diabetes mellitus with foot ulcer 06/04/2021 No Yes M14.672 Charcot's joint, left ankle and foot 06/04/2021 No Yes L97.528 Non-pressure chronic ulcer of other part of  left foot with other specified 06/04/2021 No Yes severity M86.372 Chronic multifocal osteomyelitis, left ankle and foot 06/04/2021 No Yes Inactive Problems Resolved Problems Electronic Signature(s) Signed: 08/26/2021 1:11:00 PM By: Worthy Keeler PA-C Entered By: Worthy Keeler on 08/26/2021 13:10:59 -------------------------------------------------------------------------------- Progress Note Details Patient Name: Date of Service: [name], [name] IG 08/26/2021 12:30 PM Medical Record Number: [id number] Patient Account Number: [id number] Date of Birth/Sex: Treating RN: 1961/04/30 (60 y.o. [name] Primary Care Provider: Billey Gosling Other Clinician: Referring Provider: Treating Provider/Extender: Adele Schilder in Treatment: 11 Subjective Chief Complaint Information obtained from Patient Bilateral foot diabetic ulcers History of Present Illness (HPI) 10/31/2019 upon evaluation today patient appears to be doing somewhat poorly in regard to his bilateral plantar feet. He has wounds that he tells me have been present since 2012 intermittently off and on. Most recently this has been open for at least the past 6 months to a year. He has been trying to treat this in different ways using Santyl along with various other dressings including Medihoney and even at one point Xeroform. Nothing really has seem to get this completely closed. He was recently in the hospital for cellulitis of his leg subsequently he did have x-rays as well as MRIs that showed negative for any signs of osteomyelitis in regard to the wounds on his feet. Fortunately there is no signs of systemic infection at this time. No fevers, chills, nausea, vomiting, or diarrhea. Patient has  previously used Darco offloading shoes as far as frontal floaters as well as postop surgical shoes. He has never been in a total contact cast that may be something we need to strongly consider here. Patient's most recent  hemoglobin A1c 1 month ago was 5.3 seems to be very well controlled which is great. Subsequently he has seen vascular as well as podiatry. His ABIs are 1.07 on the left and 1.14 on the right he seems to be doing well he does have chronic venous stasis. 11/07/2019 upon evaluation today patient appears to be doing well with regard to his wounds all things considered. I do not see any severe worsening he still has some callus buildup on the right more than the left he notes that he has been probably more active than he should as far as walking is concerned is just very hard to not be active. He knows he needs to be more careful in this regard however. He is willing to give the cast a try at this point although he notes that he is a little nervous about this just with regard to balance although he will be very careful and obviously if he has any trouble he knows to contact the office and let me know. 1/22; patient is in for his obligatory first total contact cast change. Our intake nurse reported a very large amount of drainage which is spelled out over to the surrounding skin. Has bilateral diabetic foot wounds. He has Charcot feet. We have been using silver alginate on his wounds. 11/14/2019 on evaluation today patient is actually seeming to make good improvement in regard to his bilateral plantar foot wounds. We have been using a cast on the left side and on the right side he has been using dressings he is changing up his own accord. With that being said he tells me that he is also not walking as much just due to how unsteady he feels. He takes it easy when he does have to walk and when he does not have to walk he is resting. This is probably help in his right foot as well has the left foot which is actually measuring better. In fact both are measuring better. Overall I am very pleased with how things seem to be progressing. The patient does have some odor on the left foot this does have me concerned  about the possibility of infection, and actually probably go ahead and put him on antibiotics today as well as utilizing a continuation of the cast on the left foot I think that will be fine we probably just need to bring him in sooner to change this not last a whole week. 1/29; we brought the patient back today for a total contact cast change on the left out of concern for excessive drainage. We are using drawtex over the wound as the primary dressing 11/21/2019 on evaluation today patient appears to be doing well with regard to his left plantar foot. In fact both foot ulcers actually seem to be doing pretty well. Nonetheless he is having a lot of drainage on the left at this time and again we did obtain a wound culture did show positive for Staph aureus that was reviewed by myself today as well. Nonetheless he is on Bactrim which was shown to be sensitive that should be helping in this regard. Fortunately there is no signs of infection systemically at this point. 2/5; back in clinic today for a total contact cast change apparently secondary to  very significant drainage. Still using drawtex 11/28/2019 upon evaluation today patient appears to be doing well with regard to his wounds. The right foot is doing okay as measured about the same in my opinion. The left foot is actually showing signs of significant improvement is measuring smaller there is a lot of hyper granulation likely due to the continued drainage at this point. We did obtain approval for a snap VAC I think that is good to be appropriate for him and will likely help this tremendously underneath the cast. He is definitely in agreement with proceeding with such. 2/12; patient came in today after his snap VAC lost suction. Brought in to see one of our nurses. The dressing was replaced and then we put the cast back on and rehooked up the snap VAC. Apparently his wound looked very good per our intake nurse. 2/15; again we replaced the cast on  Friday. By Saturday the snap VAC and light suction. He called this morning he comes in acutely. The wounds look fine however the VAC is not functioning. We replaced the cast using silver alginate as the primary dressing backed with Kerramax. The snap VAC was not replaced 12/05/2019 upon evaluation today patient appears to be doing better in regard to his left plantar foot ulcer. Fortunately there is no signs of active infection at this time. Unfortunately he is continuing to have issues with the right foot he is really not making any progress here things seem to be somewhat stagnant to be honest. The depth has increased but that is due to me having debrided the wound in the past based on what I am seeing. 12/12/2019 on evaluation today patient appears to be doing more poorly in regard to the left lower extremity. He has some erythema spreading up the side of his foot I am concerned about infection again at this point. Unfortunately he has been seeing improvement with a total contact cast but I do not think we should put that on today. On his right plantar foot he continues to have significant drainage this is actually measuring deeper I really do not feel like you are making any progress whatsoever. I have prescribed Granix for him unfortunately his insurance apparently was going to cost him a $500 co-pay. 12/19/2019 upon evaluation today patient actually appears to be doing better in regard to both wounds. With that being said he actually did get the reGranix which he had to pay $500 for. With that being said it does look like that he is actually made some improvement based on what I am seeing at this point with the reGranix. Obviously if he is going to continue this we are going to do something about trying to get him some help in covering the cost. 12/26/2019 on evaluation today patient appears to be doing really much better even compared to last week. Overall the wound seems to be much better  even compared to last week and last week was better than the week before. Since has been using the reGranix his symptoms have improved significantly. With that being said the issue right now is simply that this is a very expensive medication for him the first dose cost him $500. Upon inspection patient's wound bed actually is however dramatically improved compared to before he started this 2 weeks ago. 01/02/2020 upon evaluation today patient appears to actually be doing well. He still had a little bit of reGranix left that has been using in small amounts he just been applying it  every other day instead of every day in order to make it go longer. Overall we are still seeing excellent improvement he is measuring smaller looking better healthier tissue and everything seems to be pointing to this headed in the right direction. Fortunately there is no evidence of infection either which is also excellent news. He does have his MRI coming up within the next week. 01/09/2020 upon evaluation today patient appears to be doing a little worse today compared to previous week's evaluation. He is actually been out of the reGranix at this point. He has been trying to make the stretch out so he has been changing the dressings on a regular set schedule like he was previous. I think this has made a difference. Fortunately there is no signs of active infection at this time. No fevers, chills, nausea, vomiting, or diarrhea. 01/16/2020 upon evaluation today patient appears to be doing well with regard to his left plantar foot ulcer. The right plantar foot still shows some significant depth at this point. Fortunately there is no signs of active infection at this time. 01/30/2020 upon evaluation today patient appears to be doing about the same in regard to his right plantar foot ulcer there is still some depth here and we had to wait till he actually switched over to his new insurance to get approval for the MRI under his new  insurance plan. With that being said he now has switched as of April 1. Fortunately there is no signs of active infection at this time. Overall in regard to his left foot ulcer this seems to be doing much better and I am actually very pleased with how things are going. With that being said it is not quite as much progress as we were seeing with the reGranix but at the same time he has had trouble getting this apparently there is been some hindrance here. I Georgina Peer try to actually send this to [facility] that was recommended by the drug rep to me. 02/13/20 upon evaluation today patient appears to be doing better in regard to his left foot ulcer this is great news. Unfortunately the right foot ulcer is not really significantly better at this time. There is no signs of active infection systemically though he did have his MRI which showed unfortunately he does have infection noted including an abscess in the foot. There is also marrow changes noted which are consistent with osteomyelitis based on the radiology review and interpretation. Unfortunately considering that the wound is not really making the progress that we will he would like to be seen I think that this is an indication that he may need some further referral both infectious disease as well as potentially to podiatrist to see if there is anything that can be done to help with the situation that were dealing with here. The left foot again is doing great. READMISSION 06/04/2021 This is a 60 year old man who was in the clinic in 2021 followed by Jeri Cos for areas on the right and left foot. He developed a left foot infection and was referred to ID. He left the clinic in a nonhealed state and was followed for a period of [facility] Cannon Kettle. Apparently things really deteriorated in early July when he was admitted to hospital from 04/27/2021 through 05/06/2021 with sepsis secondary to a left foot infection. His blood  cultures were negative. An MRI suggested fifth metatarsal osteomyelitis a left ankle septic joint. He was treated with vancomycin and ertapenem  which he is still taking and may just about be finishing. He was seen by orthopedics and the patient adamantly refused to BKA. As far as I can tell he did not have the ankle aspirated I am not exactly sure what the issue was here. Since he has been discharged she is at [facility] for rehabilitation. He was last seen by Dr. Drucilla Schmidt on 05/20/2021 he noted osteo of the tibial talar bone cuboid and fifth metatarsal which is even more extensive than what was suggested by the MRI. He is apparently going for a consultation with orthopedic surgery in [place] sometime next week. I received a call about this man 2 weeks ago from Dr. Gwenlyn Found who follows him for the possibility of PAD. ABIs I think done in the office showed a ABI on the right of 1.05 at the PTA and 0.99 at the PTA on the left. He had a DVT rule out in the left leg that was negative for the DVT. Past medical history is extensive and includes diastolic heart failure, right first metatarsal head ulcer in 2021, excision of the right second ray by Dr. Cannon Kettle on 03/13/2020, hypertension, hypothyroidism. Left total hip replacement, right total knee replacement, carpal tunnel syndrome, obstructive sleep apnea alcohol abuse with cirrhosis although the patient denies current alcohol intake. The patient does not think he is a diabetic however looking through [place] link I see 2 HgbAic's of this year that were greater than equal to 6.5 which by definition makes him diabetic. Nevertheless he is not on any treatment and does not check his blood sugars. The patient is now back home out of the nursing home. Saw Dr. Drucilla Schmidt last week he was taken off vancomycin and ertapenem on August 22 and now is on doxycycline on Augmentin. He also saw Dr. Buford Dresser who is his orthopedic surgeon in [place] he recommended a  [employer]. He has been using Medihoney. 9/6I have been having trouble getting hyperbarics approved through our prior authorization process. Even though he had a limb threatening infection in the left foot and probably the left ankle there glitches in how some of the reports are worded also some of the consultants. In any case I am going to repeat his sedimentation rate and C-reactive protein. I am generally not in favor of doing things like this as they really do not alter the plan of care from my point of view however I am going to need to demonstrate that these remain high in order to get this through forhyperbaric treatment for chronic refractory osteomyelitis 9/13; following this man for a wound on his left plantar foot in the setting of type 2 diabetes and Charcot deformity. He has underlying chronic refractory osteomyelitis. Follow-up sedimentation rate and C-reactive protein were both elevated but the C-reactive protein was down to 1.4, sedimentation rate at 70. Sedimentation rate was only slightly down from previous at 85. His wound is measuring slightly smaller. 9/20; patient started hyperbaric oxygen therapy today and tolerated treatment well. This is for the underlying osteomyelitis. He remains on antibiotics but thinks he is getting close to finishing. The wound on the plantar aspect of his foot is the other issue we are following here. He is using Medihoney The patient has a Charcot foot in the setting of type 2 diabetes. He is going to need a total contact cast although his partner was away this week and we elected to delay this till next week 9/27; patient still tolerating hyperbaric oxygen well. Wound  looked generally healthy not much depth under illumination still 100% covered in fibrinous debris. Raised callused edges around the wound he was prepared for a total contact cast. We have been using Medihoney 9/30; patient is back for his first obligatory total contact cast change. We are  using Hydrofera Blue. Noted by our intake nurse to have a lot of drainage or at least a moderate amount of drainage. I am not sure I was previously aware of this 10/4; patient arrives today with a lot of drainage under the cast. When he had it changed last Friday there was also a similar amount of drainage. Her intake nurse says that they tried a wound VAC on him perhaps while I was on vacation in August under the cast but that did not work. In my experience that has not been unusual. We have been using Hydrofera Blue with all the secondary absorbers. The drainage today went right through all of our dressings. The patient is concerned about his foot being in a cast without much drainage. He is tolerating HBO well. There has been improvements in the wound in the mid part of his foot in the setting of a Charcot deformity 10/7; patient presents for cast change. He has no issues or complaints today. He denies signs of infection. 10/11; patient presents for cast change. At this time he would like to take a break from the cast. He would like to do daily dressing changes with Hydrofera Blue. He currently denies signs of infection. 10/14; his cast was taken off last week at his request. He arrives in the clinic with [facility]. He has been changing the dressing himself. He has way too much edema in the left foot and leg to consider a total contact cast. I do not really know the issue here. He does have chronic venous insufficiency His wife stopped me in the clinic earlier in the week to report he is drinking again and she is concerned. I am uncertain whether there are other issues 10/18; he arrived in clinic last week having bilateral lower extremity edema likely secondary to chronic venous insufficiency there was too much edema in the left leg to apply a total contact cast. I put him in compression on the left leg to control the swelling. This week he cut the wrap on the foot for reasons that are unclear  however today he arrives in clinic with a smaller left leg but massive edema in the left foot. He is supposed to be wearing a juxta lite stocking on the right leg but he is not wearing the contact layer. His attendance at hyperbaric oxygen has been dwindling, he did not dive yesterday and he did not dive today concerned about hyperbarics causing swelling. I looked back in his record his last echo was in 2020 essentially normal left and right ventricular function. Last BUN and creatinine were done in July this was normal. He is having a lot of drainage in the left plantar foot wound 10/25; again he comes in today having missed HBO yesterday. Macerated skin around the wound which really does not look very good at all. A lot of edema in the left foot but an improvement in edema on the left leg. We wrapped him last week because of the amount of swelling in the left leg. We could not apply a total contact cast. The patient states that he wants to be able to change his dressing himself. I might consider this if he had stockings to  control the swelling. He said he be here for HBO tomorrow. I will check the degree of erythema in his forefoot which I have marked. He is on doxycycline and Augmentin which was renewed by Dr. Drucilla Schmidt but I cannot see a follow-up note, follow-up inflammatory markers etc. I 1 point he said he was going to see his orthopedic surgeon in [place] I am not sure if he ever did this. I do not know why he has not followed up with infectious disease, he says he was not given an appointment but he is still on Augmentin doxycycline 11/1; he did not do the lab work I ordered last week. Still on Augmentin and doxycycline. Silver alginate and he is changing this daily using his own juxta lite stocking. The surface of the wound does not look too bad. No real epithelialization however. The patient was seen today along with HBO 08/26/2021 upon evaluation today patient is being seen at his request by  myself he wanted to transfer care to see me. With that being said he has been seeing Dr. Dellia Nims since he came back in August of this year. Unfortunately he tells me he still having quite a bit of drainage. He is also significant erythema and warmth noted of the foot as well. He has been hit or miss with regard to hyperbaric oxygen therapy tells me that at this point he took this week off because he was extremely claustrophobic and having a lot of issues he plans to start back next week. Nonetheless he has been given some medication by Dr. Dellia Nims as well to help calm things down which again may help him. Hopefully he can get back in hyperbarics as I think this is likely necessary. Nonetheless there does seem to be evidence of cellulitis noted today as well and in general I am a little concerned in that regard. His wound unfortunately is significant on this left foot and is right where he takes the brunt of the force secondary to the Charcot arthropathy. I do not think a total contact cast is ideal due to the fact that he unfortunately is draining much too significantly he is also not happy with the idea of using a cast therefore he states he would not want to do that anyway. Objective Constitutional Well-nourished and well-hydrated in no acute distress. Vitals Time Taken: 12:56 PM, Weight: 270 lbs, Temperature: 97.5 F, Pulse: 103 bpm, Respiratory Rate: 22 breaths/min, Blood Pressure: 148/88 mmHg. General Notes: pt did not check his blood sugar at home Respiratory normal breathing without difficulty. Psychiatric this patient is able to make decisions and demonstrates good insight into disease process. Alert and Oriented x 3. pleasant and cooperative. General Notes: Upon inspection patient's wound bed actually showed signs of good granulation and epithelization at this point in some areas he did have some slough and biofilm on the surface of the wound I did perform debridement around the edges to  clear away the callus and on the center surface to clear away some of the slough and biofilm. He tolerated this debridement without any pain or complication postdebridement this appears to be doing better. Integumentary (Hair, Skin) Wound #3 status is Open. Original cause of wound was Gradually Appeared. The date acquired was: 03/18/2021. The wound has been in treatment 11 weeks. The wound is located on the [place]. The wound measures 4.9cm length x 4.2cm width x 0.3cm depth; 16.163cm^2 area and 4.849cm^3 volume. There is Fat Layer (Subcutaneous Tissue) exposed. There is no tunneling  or undermining noted. There is a large amount of serosanguineous drainage noted. The wound margin is thickened. There is large (67-100%) red granulation within the wound bed. There is no necrotic tissue within the wound bed. Assessment Active Problems ICD-10 Type 2 diabetes mellitus with foot ulcer Charcot's joint, left ankle and foot Non-pressure chronic ulcer of other part of left foot with other specified severity Chronic multifocal osteomyelitis, left ankle and foot Procedures Wound #3 Pre-procedure diagnosis of Wound #3 is a Diabetic Wound/Ulcer of the Lower Extremity located on the Left,Plantar Foot .Severity of Tissue Pre Debridement is: Fat layer exposed. There was a Excisional Skin/Subcutaneous Tissue Debridement with a total area of 22.5 sq cm performed by Worthy Keeler, PA. With the following instrument(s): Curette to remove Viable and Non-Viable tissue/material. Material removed includes Callus, Subcutaneous Tissue, Slough, and Skin: Epidermis after achieving pain control using Other (Benzocaine 20%). No specimens were taken. A time out was conducted at 13:20, prior to the start of the procedure. A Minimum amount of bleeding was controlled with Pressure. The procedure was tolerated well with a pain level of 0 throughout and a pain level of 0 following the procedure. Post Debridement  Measurements: 4.9cm length x 4.2cm width x 0.3cm depth; 4.849cm^3 volume. Character of Wound/Ulcer Post Debridement is improved. Severity of Tissue Post Debridement is: Fat layer exposed. Post procedure Diagnosis Wound #3: Same as Pre-Procedure Plan Follow-up Appointments: Return Appointment in 1 week. - with Glynn Octave Shower/ Hygiene: May shower and wash wound with soap and water. - prior to dressing change Edema Control - Lymphedema / SCD / Other: Elevate legs to the level of the heart or above for 30 minutes daily and/or when sitting, a frequency of: - throughout the day Avoid standing for long periods of time. Exercise regularly Moisturize legs daily. Compression stocking or Garment 30-40 mm/Hg pressure to: - wear the juxtalite HD to right and left leg. apply in the morning and remove at night. Off-Loading: Open toe surgical shoe to: - left foot Other: - minimal weight bearing left foot Additional Orders / Instructions: Follow Nutritious Diet Hyperbaric Oxygen Therapy: Wound #3 Left,Plantar Foot: Evaluate for HBO Therapy - Starts 07/07/21 Indication: - Chronic Refractory Osteomyelitis 2.5 ATA for 90 Minutes with 2 Five (5) Minute Air Breaks T Number of Treatments: - 40 otal One treatments per day (delivered Monday through Friday unless otherwise specified in Special Instructions below): Finger stick Blood Glucose Pre- and Post- HBOT Treatment. Follow Hyperbaric Oxygen Glycemia Protocol Afrin (Oxymetazoline HCL) 0.05% nasal spray - 1 spray in both nostrils daily as needed prior to HBO treatment for difficulty clearing ears The following medication(s) was prescribed: benzocaine topical 20 % aerosol aerosol topical for prior to debridement was prescribed at facility WOUND #3: - Foot Wound Laterality: Plantar, Left Cleanser: Soap and Water 1 x Per Day/15 Days Discharge Instructions: May shower and wash wound with dial antibacterial soap and water prior to dressing  change. Cleanser: Wound Cleanser (Generic) 1 x Per Day/15 Days Discharge Instructions: Cleanse the wound with wound cleanser prior to applying a clean dressing using gauze sponges, not tissue or cotton balls. Prim Dressing: KerraCel Ag Gelling Fiber Dressing, 4x5 in (silver alginate) (Generic) 1 x Per Day/15 Days ary Discharge Instructions: Apply silver alginate to wound bed as instructed Secondary Dressing: ABD Pad, 5x9 (Generic) 1 x Per Day/15 Days Discharge Instructions: Apply over primary dressing as directed. Secondary Dressing: Zetuvit Plus 4x8 in (Generic) 1 x Per [id number] Days Discharge Instructions: Apply over primary  dressing as directed. Secured With: [employer], 4.5x3.1 (in/yd) (Generic) 1 x Per Day/15 Days Discharge Instructions: Secure with Kerlix as directed. Secured With: 74M Medipore H Soft Cloth Surgical T ape, 2x2 (in/yd) (Generic) 1 x Per Day/15 Days Discharge Instructions: Secure dressing with tape as directed. Com pression Stockings: Circaid Juxta Lite Compression Wrap Compression Amount: 30-40 mmHg (left) Discharge Instructions: Apply Circaid Juxta Lite Compression Wrap daily as instructed. Apply first thing in the morning, remove at night before bed. 1. Would recommend currently that we actually continue with the silver alginate dressing I think that is probably still his best option at this time. 2. I am also can recommend that we have the patient continue to monitor for any signs of worsening or infection obviously if anything changes he should let me know soon as possible. Right now I did advise him to watch out for any expanding erythema if anything occurs he should contact me as soon as possible if it is on the weekend or if he gets significantly worse he should go to the ER for further evaluation and treatment he may need IV antibiotics. For the time being he still continue to take Augmentin as well as doxycycline as prescribed by Dr. Drucilla Schmidt. 3. I am also  going to suggest that we have the patient continue with his juxta lite compression wrap. 4. With regard to hyperbarics I am definitely in agreement with the fact that he needs to continue with this although I think he needs to be consistent with coming or else is not really doing any good. He voiced understanding and states he will begin again next week and hopefully will do better with the medication. We will see patient back for reevaluation in 1 week here in the clinic. If anything worsens or changes patient will contact our office for additional recommendations. Electronic Signature(s) Signed: 08/26/2021 1:43:38 PM By: Worthy Keeler PA-C Entered By: Worthy Keeler on 08/26/2021 13:43:37 -------------------------------------------------------------------------------- SuperBill Details Patient Name: Date of Service: DEVAUNTE, [name] IG 08/26/2021 Medical Record Number: [id number] Patient Account Number: [id number] Date of Birth/Sex: Treating RN: July 08, 1961 (60 y.o. [name] Primary Care Provider: Billey Gosling Other Clinician: Referring Provider: Treating Provider/Extender: Adele Schilder in Treatment: 11 Diagnosis Coding ICD-10 Codes Code Description E11.621 Type 2 diabetes mellitus with foot ulcer M14.672 Charcot's joint, left ankle and foot L97.528 Non-pressure chronic ulcer of other part of left foot with other specified severity M86.372 Chronic multifocal osteomyelitis, left ankle and foot Facility Procedures CPT4 Code: [id number] Description: [id number] - DEB SUBQ TISSUE 20 SQ CM/< ICD-10 Diagnosis Description L97.528 Non-pressure chronic ulcer of other part of left foot with other specified sever Modifier: ity Quantity: 1 CPT4 Code: [id number] Description: [id number] - DEB SUBQ TISS EA ADDL 20CM ICD-10 Diagnosis Description L97.528 Non-pressure chronic ulcer of other part of left foot with other specified sever Modifier: ity Quantity: 1 Physician Procedures :  CPT4 Code Description Modifier [id number] - WC PHYS LEVEL 4 - EST PT 25 ICD-10 Diagnosis Description E11.621 Type 2 diabetes mellitus with foot ulcer M14.672 Charcot's joint, left ankle and foot L97.528 Non-pressure chronic ulcer of other part of  left foot with other specified severity M86.372 Chronic multifocal osteomyelitis, left ankle and foot Quantity: 1 : [id number] - WC PHYS SUBQ TISS 20 SQ CM ICD-10 Diagnosis Description L97.528 Non-pressure chronic ulcer of other part of left foot with other specified severity Quantity: 1 : [id number] - WC PHYS SUBQ TISS EA ADDL  20 CM ICD-10 Diagnosis Description L97.528 Non-pressure chronic ulcer of other part of left foot with other specified severity Quantity: 1 Electronic Signature(s) Signed: 08/26/2021 1:44:04 PM By: Worthy Keeler PA-C Entered By: Worthy Keeler on 08/26/2021 13:44:04

## 2021-08-28 ENCOUNTER — Encounter (HOSPITAL_BASED_OUTPATIENT_CLINIC_OR_DEPARTMENT_OTHER): Payer: Medicare Other | Admitting: Internal Medicine

## 2021-08-28 IMAGING — US US EXTREM LOW*R* LIMITED
1 series · 14 of 19 positions shown · non-contrast
Comparison: None.

CLINICAL DATA: Lump of thigh on the right

EXAM:
ULTRASOUND RIGHT LOWER EXTREMITY LIMITED
TECHNIQUE: Ultrasound examination of the lower extremity soft tissues was
performed in the area of clinical concern.

[Series 1: us extrem low*right* limited · 19 acquisitions, 14 frames shown]
[im 1/19]
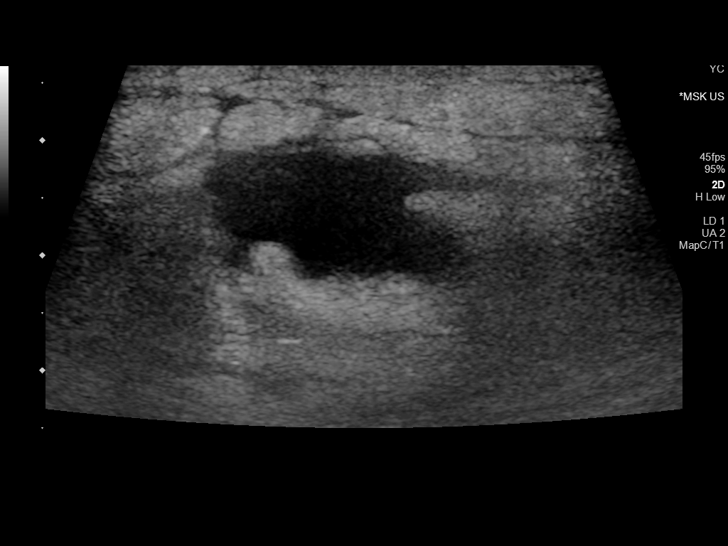
[im 3/19]
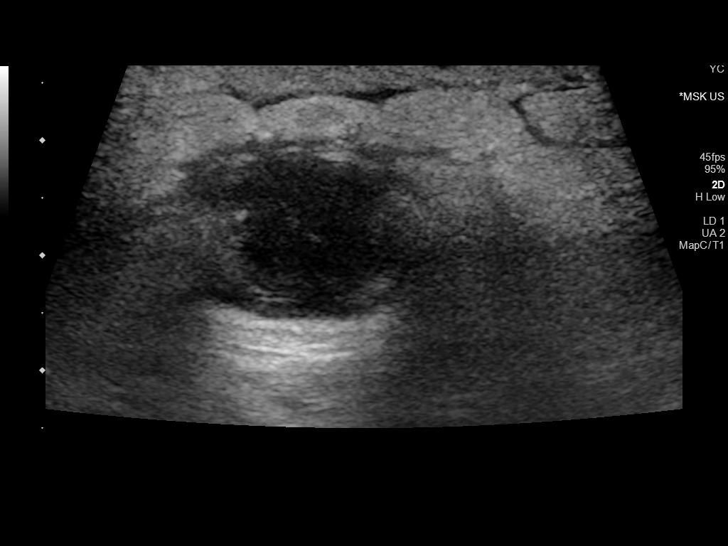
[im 4/19]
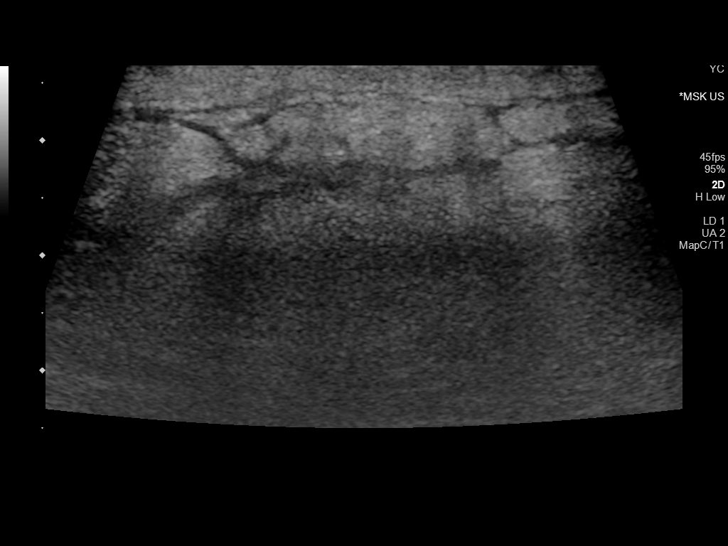
[im 5/19]
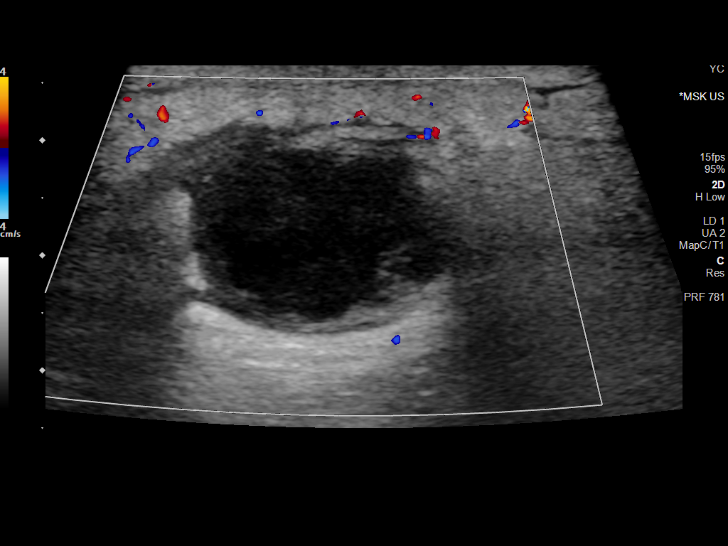
[im 7/19]
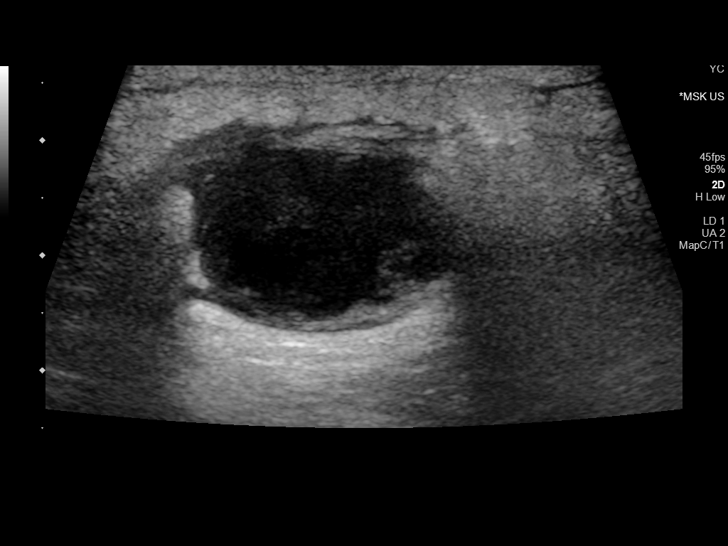
[im 8/19]
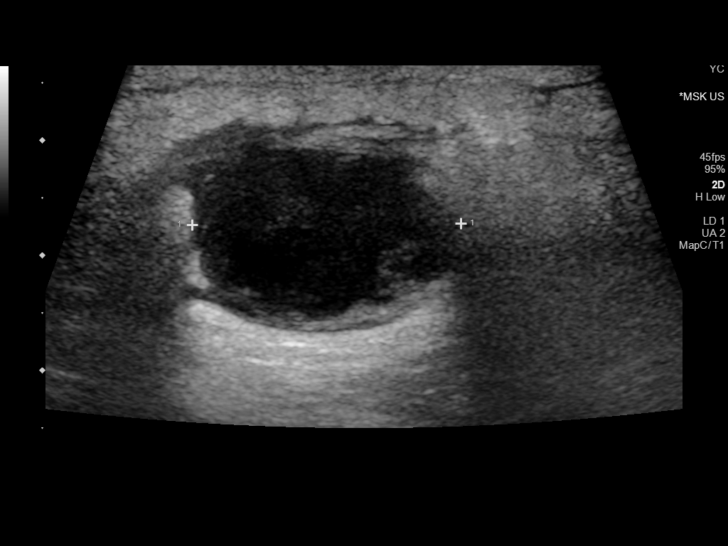
[im 9/19]
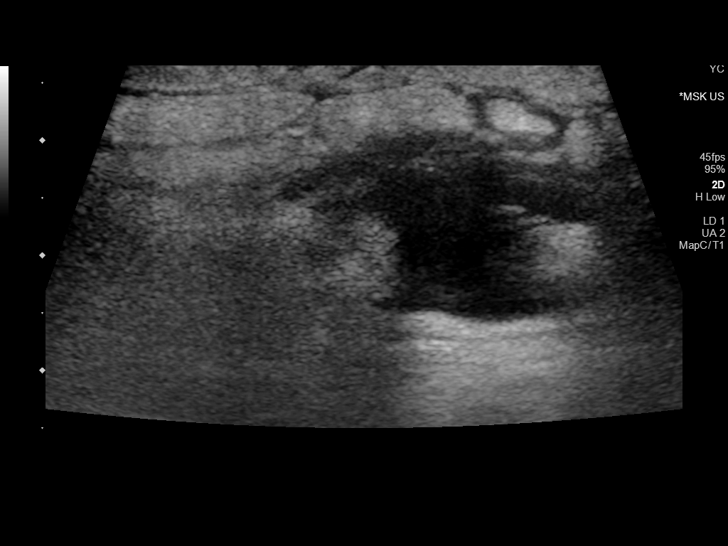
[im 11/19]
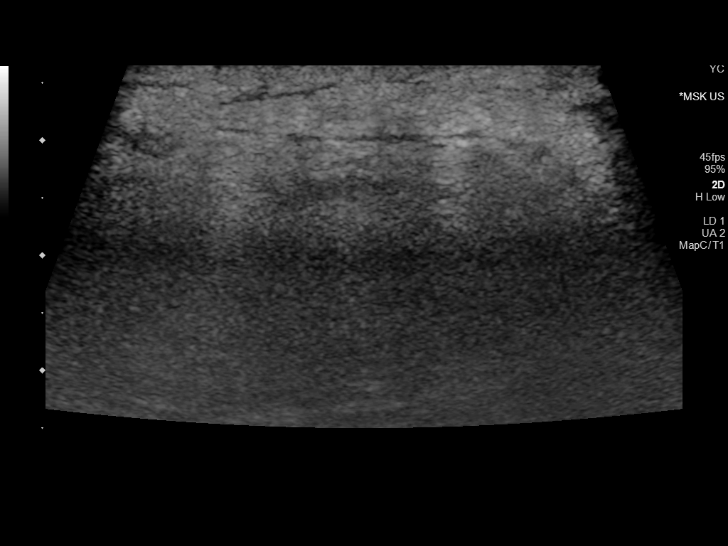
[im 12/19]
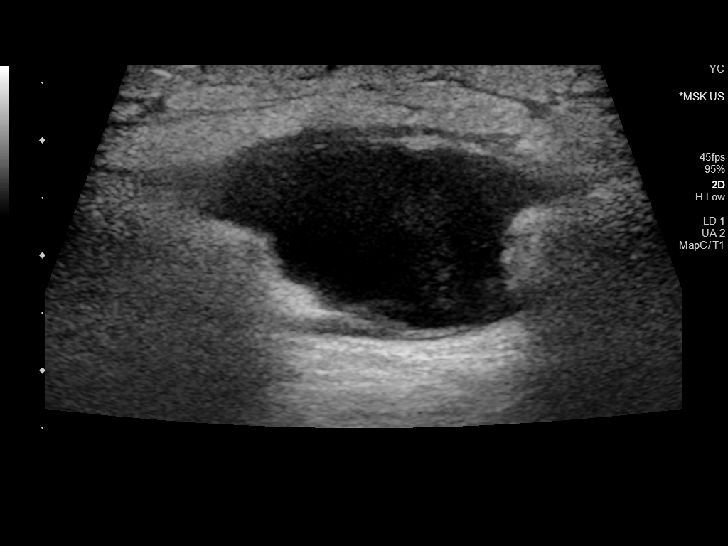
[im 13/19]
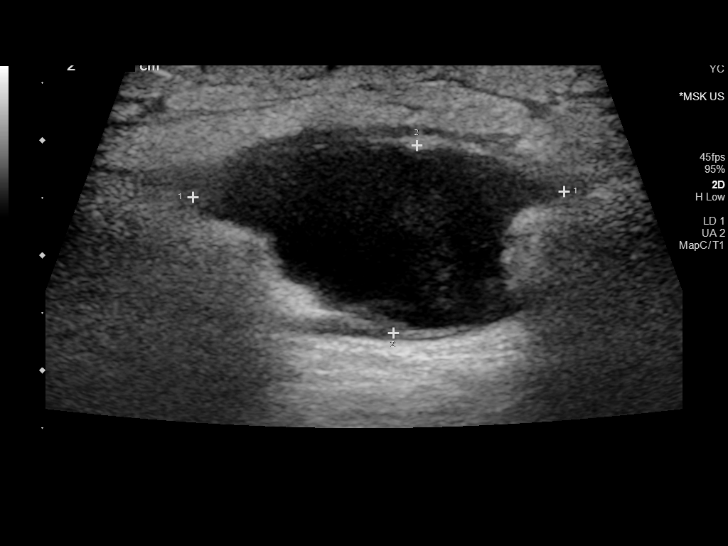
[im 15/19]
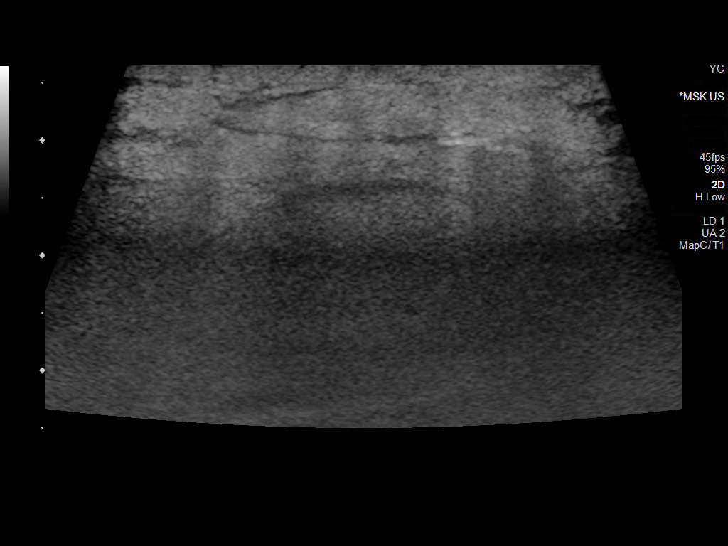
[im 16/19]
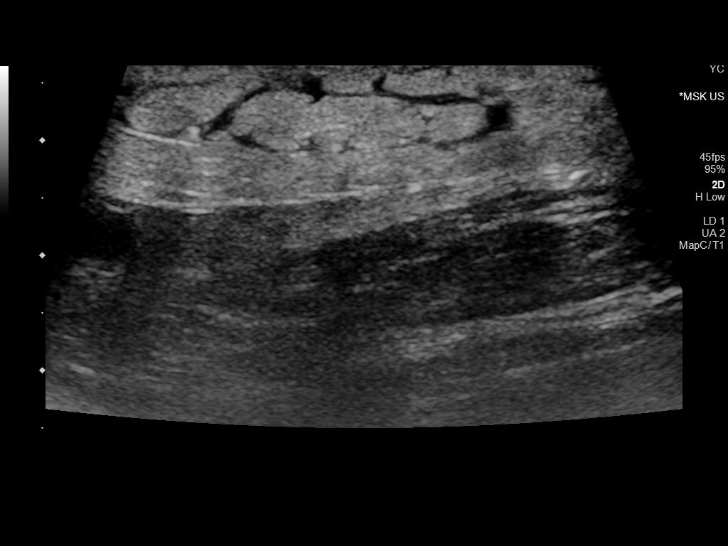
[im 17/19]
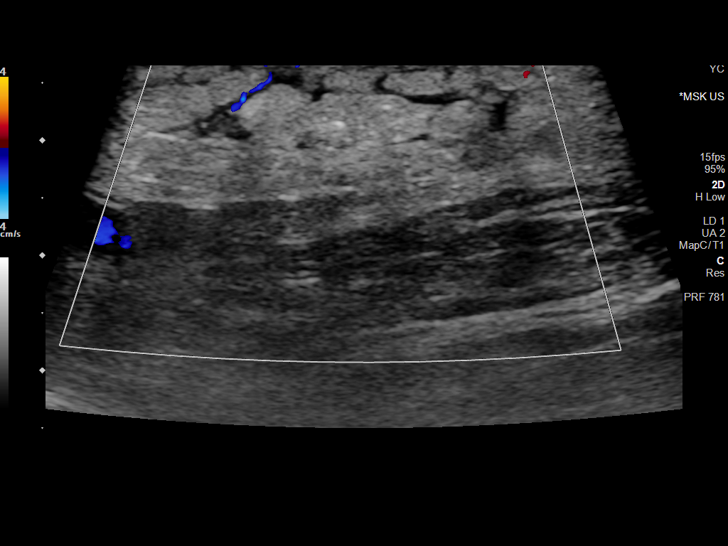
[im 19/19]
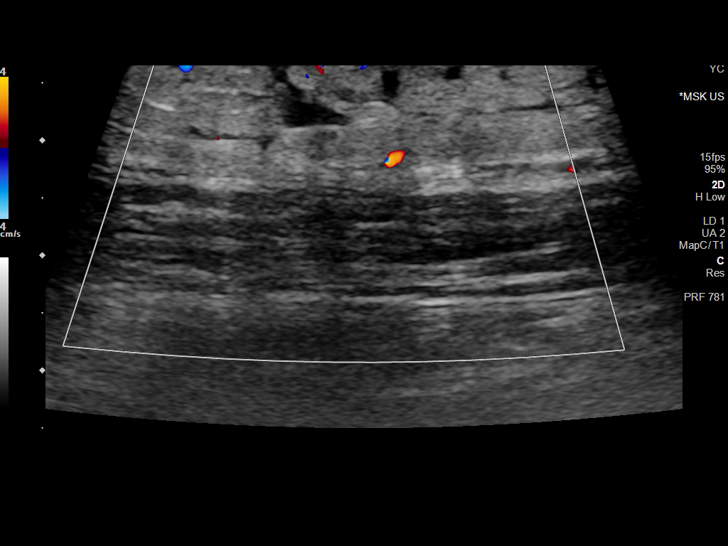

[14 of 19 positions shown; findings below may reference images not displayed]

FINDINGS: Imaging performed over the right thigh. Generalized subcutaneous
edema with bands of reticulation between the fat lobules. Along the
deep subcutaneous and muscular layers is a hypoechoic avascular
mildly complex collection which measures up to 3 cm.
IMPRESSION: Deep subcutaneous collection measuring up to 3 cm with regional
subcutaneous edema. Assuming there is regional cellulitis findings
are most suggestive of abscess.

## 2021-08-31 ENCOUNTER — Encounter (HOSPITAL_BASED_OUTPATIENT_CLINIC_OR_DEPARTMENT_OTHER): Payer: Medicare Other | Admitting: Internal Medicine

## 2021-09-01 ENCOUNTER — Encounter (HOSPITAL_COMMUNITY): Payer: Self-pay

## 2021-09-01 ENCOUNTER — Emergency Department (HOSPITAL_COMMUNITY): Payer: Medicare Other

## 2021-09-01 ENCOUNTER — Inpatient Hospital Stay (HOSPITAL_COMMUNITY)
Admission: EM | Admit: 2021-09-01 | Discharge: 2021-09-05 | DRG: 896 | Disposition: A | Discharge status: Home Health | Payer: Medicare Other | Attending: Internal Medicine | Admitting: Internal Medicine

## 2021-09-01 DIAGNOSIS — I890 Lymphedema, not elsewhere classified: Secondary | ICD-10-CM | POA: Diagnosis present

## 2021-09-01 DIAGNOSIS — G4733 Obstructive sleep apnea (adult) (pediatric): Secondary | ICD-10-CM | POA: Diagnosis present

## 2021-09-01 DIAGNOSIS — R296 Repeated falls: Secondary | ICD-10-CM | POA: Diagnosis present

## 2021-09-01 DIAGNOSIS — F10239 Alcohol dependence with withdrawal, unspecified: Secondary | ICD-10-CM | POA: Diagnosis not present

## 2021-09-01 DIAGNOSIS — R269 Unspecified abnormalities of gait and mobility: Secondary | ICD-10-CM | POA: Diagnosis present

## 2021-09-01 DIAGNOSIS — E039 Hypothyroidism, unspecified: Secondary | ICD-10-CM | POA: Diagnosis present

## 2021-09-01 DIAGNOSIS — J9601 Acute respiratory failure with hypoxia: Secondary | ICD-10-CM | POA: Diagnosis present

## 2021-09-01 DIAGNOSIS — F1210 Cannabis abuse, uncomplicated: Secondary | ICD-10-CM | POA: Diagnosis present

## 2021-09-01 DIAGNOSIS — I5042 Chronic combined systolic (congestive) and diastolic (congestive) heart failure: Secondary | ICD-10-CM | POA: Diagnosis present

## 2021-09-01 DIAGNOSIS — Z87891 Personal history of nicotine dependence: Secondary | ICD-10-CM

## 2021-09-01 DIAGNOSIS — F10231 Alcohol dependence with withdrawal delirium: Secondary | ICD-10-CM | POA: Diagnosis not present

## 2021-09-01 DIAGNOSIS — M19041 Primary osteoarthritis, right hand: Secondary | ICD-10-CM | POA: Diagnosis present

## 2021-09-01 DIAGNOSIS — R000 Tachycardia, unspecified: Secondary | ICD-10-CM | POA: Diagnosis present

## 2021-09-01 DIAGNOSIS — E119 Type 2 diabetes mellitus without complications: Secondary | ICD-10-CM | POA: Diagnosis present

## 2021-09-01 DIAGNOSIS — Z96643 Presence of artificial hip joint, bilateral: Secondary | ICD-10-CM | POA: Diagnosis present

## 2021-09-01 DIAGNOSIS — F419 Anxiety disorder, unspecified: Secondary | ICD-10-CM | POA: Diagnosis present

## 2021-09-01 DIAGNOSIS — M86672 Other chronic osteomyelitis, left ankle and foot: Secondary | ICD-10-CM | POA: Diagnosis present

## 2021-09-01 DIAGNOSIS — E11621 Type 2 diabetes mellitus with foot ulcer: Secondary | ICD-10-CM | POA: Diagnosis present

## 2021-09-01 DIAGNOSIS — E1169 Type 2 diabetes mellitus with other specified complication: Secondary | ICD-10-CM | POA: Diagnosis present

## 2021-09-01 DIAGNOSIS — M19042 Primary osteoarthritis, left hand: Secondary | ICD-10-CM | POA: Diagnosis present

## 2021-09-01 DIAGNOSIS — K760 Fatty (change of) liver, not elsewhere classified: Secondary | ICD-10-CM | POA: Diagnosis present

## 2021-09-01 DIAGNOSIS — L97429 Non-pressure chronic ulcer of left heel and midfoot with unspecified severity: Secondary | ICD-10-CM | POA: Diagnosis present

## 2021-09-01 DIAGNOSIS — Z20822 Contact with and (suspected) exposure to covid-19: Secondary | ICD-10-CM | POA: Diagnosis present

## 2021-09-01 DIAGNOSIS — D6959 Other secondary thrombocytopenia: Secondary | ICD-10-CM | POA: Diagnosis present

## 2021-09-01 DIAGNOSIS — Z89422 Acquired absence of other left toe(s): Secondary | ICD-10-CM

## 2021-09-01 DIAGNOSIS — I5032 Chronic diastolic (congestive) heart failure: Secondary | ICD-10-CM | POA: Diagnosis present

## 2021-09-01 DIAGNOSIS — D72819 Decreased white blood cell count, unspecified: Secondary | ICD-10-CM | POA: Diagnosis present

## 2021-09-01 DIAGNOSIS — I110 Hypertensive heart disease with heart failure: Secondary | ICD-10-CM | POA: Diagnosis present

## 2021-09-01 DIAGNOSIS — E785 Hyperlipidemia, unspecified: Secondary | ICD-10-CM | POA: Diagnosis present

## 2021-09-01 DIAGNOSIS — G8929 Other chronic pain: Secondary | ICD-10-CM | POA: Diagnosis present

## 2021-09-01 DIAGNOSIS — Z6835 Body mass index (BMI) 35.0-35.9, adult: Secondary | ICD-10-CM

## 2021-09-01 DIAGNOSIS — E11628 Type 2 diabetes mellitus with other skin complications: Secondary | ICD-10-CM | POA: Diagnosis present

## 2021-09-01 DIAGNOSIS — E1142 Type 2 diabetes mellitus with diabetic polyneuropathy: Secondary | ICD-10-CM | POA: Diagnosis present

## 2021-09-01 DIAGNOSIS — L089 Local infection of the skin and subcutaneous tissue, unspecified: Secondary | ICD-10-CM | POA: Diagnosis present

## 2021-09-01 DIAGNOSIS — E876 Hypokalemia: Secondary | ICD-10-CM | POA: Diagnosis present

## 2021-09-01 DIAGNOSIS — R7989 Other specified abnormal findings of blood chemistry: Secondary | ICD-10-CM | POA: Diagnosis present

## 2021-09-01 DIAGNOSIS — F10230 Alcohol dependence with withdrawal, uncomplicated: Secondary | ICD-10-CM | POA: Diagnosis not present

## 2021-09-01 DIAGNOSIS — R0902 Hypoxemia: Secondary | ICD-10-CM

## 2021-09-01 DIAGNOSIS — E669 Obesity, unspecified: Secondary | ICD-10-CM | POA: Diagnosis present

## 2021-09-01 DIAGNOSIS — F32A Depression, unspecified: Secondary | ICD-10-CM | POA: Diagnosis present

## 2021-09-01 DIAGNOSIS — M545 Low back pain, unspecified: Secondary | ICD-10-CM | POA: Diagnosis present

## 2021-09-01 DIAGNOSIS — F10939 Alcohol use, unspecified with withdrawal, unspecified: Secondary | ICD-10-CM

## 2021-09-01 DIAGNOSIS — D696 Thrombocytopenia, unspecified: Secondary | ICD-10-CM | POA: Diagnosis present

## 2021-09-01 DIAGNOSIS — L97509 Non-pressure chronic ulcer of other part of unspecified foot with unspecified severity: Secondary | ICD-10-CM | POA: Diagnosis present

## 2021-09-01 HISTORY — DX: Alcohol dependence with withdrawal, unspecified: F10.239

## 2021-09-01 LAB — COMPREHENSIVE METABOLIC PANEL
ALT: 37 U/L (ref 0–44)
AST: 72 U/L — ABNORMAL HIGH (ref 15–41)
Albumin: 3.7 g/dL (ref 3.5–5.0)
Alkaline Phosphatase: 144 U/L — ABNORMAL HIGH (ref 38–126)
Anion gap: 12 (ref 5–15)
BUN: 15 mg/dL (ref 6–20)
CO2: 27 mmol/L (ref 22–32)
Calcium: 8.8 mg/dL — ABNORMAL LOW (ref 8.9–10.3)
Chloride: 96 mmol/L — ABNORMAL LOW (ref 98–111)
Creatinine, Ser: 0.89 mg/dL (ref 0.61–1.24)
GFR, Estimated: >60 mL/min (ref >60)
Glucose, Bld: 112 mg/dL — ABNORMAL HIGH (ref 70–99)
Potassium: 3.3 mmol/L — ABNORMAL LOW (ref 3.5–5.1)
Sodium: 135 mmol/L (ref 135–145)
Total Bilirubin: 2.1 mg/dL — ABNORMAL HIGH (ref 0.3–1.2)
Total Protein: 8.3 g/dL — ABNORMAL HIGH (ref 6.5–8.1)

## 2021-09-01 LAB — CBC WITH DIFFERENTIAL/PLATELET
Abs Immature Granulocytes: 0.04 10*3/uL (ref 0.00–0.07)
Basophils Absolute: 0 10*3/uL (ref 0.0–0.1)
Basophils Relative: 0 %
Eosinophils Absolute: 0 10*3/uL (ref 0.0–0.5)
Eosinophils Relative: 0 %
HCT: 37.8 % — ABNORMAL LOW (ref 39.0–52.0)
Hemoglobin: 12.7 g/dL — ABNORMAL LOW (ref 13.0–17.0)
Immature Granulocytes: 1 %
Lymphocytes Relative: 11 %
Lymphs Abs: 0.5 10*3/uL — ABNORMAL LOW (ref 0.7–4.0)
MCH: 29.5 pg (ref 26.0–34.0)
MCHC: 33.6 g/dL (ref 30.0–36.0)
MCV: 87.7 fL (ref 80.0–100.0)
Monocytes Absolute: 0.4 10*3/uL (ref 0.1–1.0)
Monocytes Relative: 9 %
Neutro Abs: 3.6 10*3/uL (ref 1.7–7.7)
Neutrophils Relative %: 79 %
Platelets: 103 10*3/uL — ABNORMAL LOW (ref 150–400)
RBC: 4.31 MIL/uL (ref 4.22–5.81)
RDW: 14.6 % (ref 11.5–15.5)
WBC: 4.5 10*3/uL (ref 4.0–10.5)
nRBC: 0 % (ref 0.0–0.2)

## 2021-09-01 LAB — ETHANOL: Alcohol, Ethyl (B): <10 mg/dL (ref <10)

## 2021-09-01 LAB — RESP PANEL BY RT-PCR (FLU A&B, COVID) ARPGX2
Influenza A by PCR: NEGATIVE
Influenza B by PCR: NEGATIVE
SARS Coronavirus 2 by RT PCR: NEGATIVE

## 2021-09-01 LAB — MAGNESIUM: Magnesium: 1.3 mg/dL — ABNORMAL LOW (ref 1.7–2.4)

## 2021-09-01 MED ORDER — ONDANSETRON HCL 4 MG/2ML IJ SOLN: 4.0000 mg | Freq: Four times a day (QID) | INTRAMUSCULAR | Status: DC | PRN

## 2021-09-01 MED ORDER — LORAZEPAM 1 MG PO TABS: 0.0000 mg | ORAL_TABLET | Freq: Two times a day (BID) | ORAL | Status: DC

## 2021-09-01 MED ORDER — ACETAMINOPHEN 325 MG PO TABS
650.0000 mg | ORAL_TABLET | Freq: Four times a day (QID) | ORAL | Status: DC | PRN
  Administered 2021-09-02: 650 mg via ORAL
  Filled 2021-09-01: qty 2

## 2021-09-01 MED ORDER — THIAMINE HCL 100 MG PO TABS
100.0000 mg | ORAL_TABLET | Freq: Every day | ORAL | Status: DC
  Administered 2021-09-02 – 2021-09-05 (×4): 100 mg via ORAL
  Filled 2021-09-01 (×4): qty 1

## 2021-09-01 MED ORDER — SODIUM CHLORIDE 0.9% FLUSH
3.0000 mL | Freq: Two times a day (BID) | INTRAVENOUS | Status: DC
  Administered 2021-09-02 – 2021-09-04 (×7): 3 mL via INTRAVENOUS

## 2021-09-01 MED ORDER — POTASSIUM CHLORIDE 10 MEQ/100ML IV SOLN
10.0000 meq | INTRAVENOUS | Status: AC
  Administered 2021-09-02 (×3): 10 meq via INTRAVENOUS
  Filled 2021-09-01 (×3): qty 100

## 2021-09-01 MED ORDER — MAGNESIUM SULFATE 2 GM/50ML IV SOLN
2.0000 g | Freq: Once | INTRAVENOUS | Status: AC
  Administered 2021-09-02: 2 g via INTRAVENOUS
  Filled 2021-09-01: qty 50

## 2021-09-01 MED ORDER — ONDANSETRON HCL 4 MG PO TABS: 4.0000 mg | ORAL_TABLET | Freq: Four times a day (QID) | ORAL | Status: DC | PRN

## 2021-09-01 MED ORDER — MORPHINE SULFATE (PF) 2 MG/ML IV SOLN
2.0000 mg | INTRAVENOUS | Status: AC | PRN
  Administered 2021-09-02 (×3): 2 mg via INTRAVENOUS
  Filled 2021-09-01 (×3): qty 1

## 2021-09-01 MED ORDER — HEPARIN SODIUM (PORCINE) 5000 UNIT/ML IJ SOLN
5000.0000 [IU] | Freq: Three times a day (TID) | INTRAMUSCULAR | Status: DC
  Administered 2021-09-02 (×3): 5000 [IU] via SUBCUTANEOUS
  Filled 2021-09-01 (×3): qty 1

## 2021-09-01 MED ORDER — THIAMINE HCL 100 MG/ML IJ SOLN
500.0000 mg | Freq: Once | INTRAMUSCULAR | Status: AC
  Administered 2021-09-01: 500 mg via INTRAVENOUS
  Filled 2021-09-01: qty 6

## 2021-09-01 MED ORDER — SODIUM CHLORIDE 0.9 % IV BOLUS
1000.0000 mL | Freq: Once | INTRAVENOUS | Status: AC
  Administered 2021-09-01: 1000 mL via INTRAVENOUS

## 2021-09-01 MED ORDER — LORAZEPAM 1 MG PO TABS
1.0000 mg | ORAL_TABLET | ORAL | Status: AC | PRN
  Administered 2021-09-03: 18:00:00 1 mg via ORAL
  Administered 2021-09-04: 2 mg via ORAL
  Filled 2021-09-01: qty 2
  Filled 2021-09-01: qty 1

## 2021-09-01 MED ORDER — LORAZEPAM 2 MG/ML IJ SOLN: 0.0000 mg | Freq: Two times a day (BID) | INTRAMUSCULAR | Status: DC

## 2021-09-01 MED ORDER — LORAZEPAM 1 MG PO TABS
2.0000 mg | ORAL_TABLET | Freq: Once | ORAL | Status: AC
  Administered 2021-09-01: 2 mg via ORAL
  Filled 2021-09-01: qty 2

## 2021-09-01 MED ORDER — FOLIC ACID 1 MG PO TABS
1.0000 mg | ORAL_TABLET | Freq: Every day | ORAL | Status: DC
  Administered 2021-09-02 – 2021-09-05 (×4): 1 mg via ORAL
  Filled 2021-09-01 (×4): qty 1

## 2021-09-01 MED ORDER — SENNOSIDES-DOCUSATE SODIUM 8.6-50 MG PO TABS: 1.0000 | ORAL_TABLET | Freq: Every evening | ORAL | Status: DC | PRN

## 2021-09-01 MED ORDER — MORPHINE SULFATE (PF) 4 MG/ML IV SOLN
4.0000 mg | Freq: Once | INTRAVENOUS | Status: AC
  Administered 2021-09-01: 4 mg via INTRAVENOUS
  Filled 2021-09-01: qty 1

## 2021-09-01 MED ORDER — LORAZEPAM 2 MG/ML IJ SOLN
1.0000 mg | INTRAMUSCULAR | Status: AC | PRN
  Administered 2021-09-02: 2 mg via INTRAVENOUS
  Administered 2021-09-02: 1 mg via INTRAVENOUS
  Administered 2021-09-02: 2 mg via INTRAVENOUS
  Administered 2021-09-03: 08:00:00 1 mg via INTRAVENOUS
  Administered 2021-09-03: 22:00:00 2 mg via INTRAVENOUS
  Filled 2021-09-01 (×6): qty 1

## 2021-09-01 MED ORDER — THIAMINE HCL 100 MG/ML IJ SOLN: 100.0000 mg | Freq: Every day | INTRAMUSCULAR | Status: DC

## 2021-09-01 MED ORDER — ACETAMINOPHEN 650 MG RE SUPP: 650.0000 mg | Freq: Four times a day (QID) | RECTAL | Status: DC | PRN

## 2021-09-01 MED ORDER — ADULT MULTIVITAMIN W/MINERALS CH
1.0000 | ORAL_TABLET | Freq: Every day | ORAL | Status: DC
  Administered 2021-09-02 – 2021-09-05 (×4): 1 via ORAL
  Filled 2021-09-01 (×4): qty 1

## 2021-09-01 MED ORDER — LORAZEPAM 1 MG PO TABS: 0.0000 mg | ORAL_TABLET | Freq: Four times a day (QID) | ORAL | Status: DC

## 2021-09-01 MED ORDER — LORAZEPAM 2 MG/ML IJ SOLN
0.0000 mg | Freq: Four times a day (QID) | INTRAMUSCULAR | Status: DC
  Administered 2021-09-01: 2 mg via INTRAVENOUS
  Filled 2021-09-01: qty 1

## 2021-09-01 NOTE — ED Provider Notes (Signed)
Emergency Medicine Provider Triage Evaluation Note  [name] , a 60 y.o. male  was evaluated in triage.  Pt complains of tremors and back pain.  Patient last used alcohol 2 days ago, has enrolled in an assistance program and is reportedly taking meds.  However, over the past day the patient is well tremor, and today had a mechanical fall.  He presents due to this latter issue, though with concern for the former.  Pain is diffuse throughout the back, sore, severe, worse with ambulation.  Review of Systems  Positive: As above Negative: No weakness, confusion, disorientation, vomiting.  Physical Exam  BP (!) 140/113 (BP Location: Left Arm)   Pulse (!) 114   Temp 98.7 F (37.1 C) (Oral)   Resp 18   SpO2 95%  Gen:   Awake, no distress tremulous Resp:  Normal effort  MSK:   Moves extremities without difficulty  Other:    Medical Decision Making  Medically screening exam initiated at 5:40 PM.  Appropriate orders placed.  Takeo Harts was informed that the remainder of the evaluation will be completed by another provider, this initial triage assessment does not replace that evaluation, and the importance of remaining in the ED until their evaluation is complete.  His initial x-rays were reassuring, but with continued tremulousness, concern for withdrawal he was transferred to an acute care room for further evaluation.     Gerhard Munch, MD 09/01/21 [place] [place]

## 2021-09-01 NOTE — H&P (Signed)
History and Physical    [name] ZMO:[id number] DOB: 08-Sep-1961 DOA: 09/01/2021  PCP: Binnie Rail, MD  Patient coming from: Home  I have personally briefly reviewed patient's old medical records in [place]  Chief Complaint: Alcohol withdrawal  HPI: [name] is a 60 y.o. male with medical history significant for alcohol use disorder, chronic diastolic CHF (Last EF 03-54%), T2DM, chronic diabetic foot ulcers on chronic antibiotic, HTN, hypothyroidism, depression, chronic back pain, obesity, OSA not using CPAP who presented to the ED for management of alcohol withdrawal.  Patient states he quit drinking alcohol 2 days ago and attempted detox.  He does not exactly quantify how much he was drinking previously but states that he would go through at least 1/5th gallon of vodka per week in addition to drinking some wine.  He has been having tremors and feeling off balance.  He fell prior to admission and afterwards developed pain in his lower back.  He says the pain goes across his lower back with occasional shooting pain down his left leg.  He did not have any injury to his head.  Symptoms have been ongoing for 24 hours therefore he came to the ED for further evaluation.  Patient does have a chronic infected wound to his left foot on the plantar surface, apparently having refused surgical intervention in the past.  He is on chronic Augmentin and doxycycline for management as well as routine wound care visits.  ED Course:  Initial vitals showed BP 140/113, pulse 126, RR 18, temp 98.7 F, SPO2 97% on room air.  Labs show sodium 135, potassium 3.3, magnesium 1.3, bicarb 27, BUN 15, creatinine 0.9, serum glucose 112, AST 72, ALT 37, alkaline phosphatase 144, total bilirubin 2.1, WBC 4.5, hemoglobin 12.7, platelets 103,000, serum ethanol undetectable, vitamin B1 collected and in process.  COVID and influenza PCR's are negative.  Thoracic spine x-ray negative for acute bony  abnormality.  Lumbar spine x-ray shows degenerative changes without acute bony abnormality.  CTA without contrast shows minimal atrophic changes without acute intracranial abnormality.  Patient was given oral Ativan 2 mg, IV Ativan 2 mg, IV morphine 4 mg, 1 L normal saline, IV thiamine 500 mg.  The hospitalist service was consulted to admit for further evaluation and management.  Review of Systems: All systems reviewed and are negative except as documented in history of present illness above.   Past Medical History:  Diagnosis Date   Alcohol dependence ([place])    Arthritis    hips, hands   Bilateral carpal tunnel syndrome    Bilateral leg edema    Chronic   CHF (congestive heart failure) (HCC)    Diabetes mellitus without complication (HCC)    type 2   Diverticulitis    portion of colon removed   DOE (dyspnea on exertion)    occ   Elevated liver enzymes    Fatty liver    GERD (gastroesophageal reflux disease)    occ   Hammer toe    Hip pain    History of ventral hernia repair 2016   x2   Hyperlipidemia    pt unsure   Hypertension    Marijuana abuse    Morbid obesity ([place])    Neuromuscular disorder ([place])    peripheral neuropathy feet and few fingers   OSA (obstructive sleep apnea)    has OSA-not used CPAP 2-3 yrs could not tolerate cpap   PONV (postoperative nausea and vomiting)    Toe ulcer ([place])  left healed    Past Surgical History:  Procedure Laterality Date   AMPUTATION TOE Left 05/25/2018   Procedure: AMPUTATION TOE left 3rd;  Surgeon: Wylene Simmer, MD;  Location: [facility];  Service: Orthopedics;  Laterality: Left;  63mn, to follow   AMPUTATION TOE Left 06/28/2018   Procedure: Left foot revision 3rd toe amputation including 3rd metatarsal;  Surgeon: HWylene Simmer MD;  [place] [facility]  Service: Orthopedics;  Laterality: Left;  620m   BICEPS TENDON REPAIR Left 2014   Partial   COLONOSCOPY     GRAFT APPLICATION Right  02/15/53/0086 Procedure: GRAFT APPLICATION;  Surgeon: StLandis MartinsDPM;  [place] [facility] Service: Podiatry;  Laterality: Right;   HERNIA REPAIR  2016   ventral   HIP CLOSED REDUCTION Right 09/01/2018   Procedure: CLOSED REDUCTION HIP;  Surgeon: RoNicholes StairsMD;  Location[facility];  Service: Orthopedics;  Laterality: Right;   INCISION AND DRAINAGE OF WOUND Right 03/03/2020   Procedure: IRRIGATION AND DEBRIDEMENT WOUND;  Surgeon: StLandis MartinsDPM;  [place] [facility] Service: Podiatry;  Laterality: Right;   JOINT REPLACEMENT     b/l knees    METATARSAL HEAD EXCISION Right 03/03/2020   Procedure: IRRIGATION OF TOE AND CAUTERIZATION OF BLEEDING TOE;  Surgeon: StLandis MartinsDPM;  [place] [facility] Service: Podiatry;  Laterality: Right;   METATARSAL HEAD EXCISION Right 03/03/2020   Procedure: METATARSAL HEAD EXCISION SECOND TOE RIGHT;  Surgeon: StLandis MartinsDPM;  [place] [facility] Service: Podiatry;  Laterality: Right;  MAC WITH LOCAL   TOE AMPUTATION Right 09/2013   TOTAL HIP ARTHROPLASTY Right 08/16/2018   Procedure: TOTAL HIP ARTHROPLASTY ANTERIOR APPROACH;  Surgeon: SwRod CanMD;  [facility];  Service: Orthopedics;  Laterality: Right;   TOTAL HIP ARTHROPLASTY Left 11/02/2018   Procedure: TOTAL HIP ARTHROPLASTY ANTERIOR APPROACH;  Surgeon: SwRod CanMD;  [facility];  Service: Orthopedics;  Laterality: Left;   TOTAL KNEE ARTHROPLASTY     bilat    Social History:  reports that he has quit smoking. His smoking use included cigarettes. He has a 2.50 pack-year smoking history. He has never used smokeless tobacco. He reports that he does not currently use alcohol. He reports current drug use. Frequency: 1.00 time per week. Drug: Marijuana.  Allergies  Allergen Reactions   Claritin [Loratadine] Shortness Of Breath and Anxiety    Family History  Adopted: Yes  Family history unknown: Yes      Prior to Admission medications   Medication Sig Start Date End Date Taking? Authorizing Provider  acamprosate (CAMPRAL) 333 MG tablet Take 333 mg by mouth 2 (two) times daily. 04/01/21  Yes [provider]  amoxicillin-clavulanate (AUGMENTIN) 875-125 MG tablet Take 1 tablet by mouth 2 (two) times daily. 06/05/21  Yes VaTommy MedalCoLavell IslamMD  doxycycline (VIBRA-TABS) 100 MG tablet Take 1 tablet (100 mg total) by mouth 2 (two) times daily. 06/05/21  Yes VaTommy MedalCoLavell IslamMD  furosemide (LASIX) 40 MG tablet TAKE 1 TABLET BY MOUTH TWICE A DAY Patient taking differently: Take 80 mg by mouth 2 (two) times daily. 04/13/21  Yes Burns, StClaudina LickMD  gabapentin (NEURONTIN) 100 MG capsule Take 1 capsule (100 mg total) by mouth 3 (three) times daily. Patient taking differently: Take 300 mg by mouth 2 (two) times daily. 11/26/20  Yes Burns, StClaudina LickMD  HEMP OIL-VANILLYL BUTYL ETHER EX Apply  1 application topically 2 (two) times daily as needed (arthritis pain).   Yes [provider]  hydrOXYzine (ATARAX/VISTARIL) 25 MG tablet Take 25 mg by mouth 2 (two) times daily. 03/10/21  Yes [provider]  levothyroxine (SYNTHROID) 50 MCG tablet Take 50 mcg by mouth daily before breakfast.   Yes [provider]  LORazepam (ATIVAN) 1 MG tablet Take 1 mg by mouth daily as needed for anxiety. 08/09/21  Yes [provider]  naltrexone (DEPADE) 50 MG tablet Take 50 mg by mouth daily.   Yes [provider]  naproxen sodium (ALEVE) 220 MG tablet Take 220 mg by mouth every 12 (twelve) hours.   Yes [provider]  potassium chloride SA (KLOR-CON M20) 20 MEQ tablet Take 3 tablets (60 mEq total) by mouth 2 (two) times daily. 05/05/21 09/01/22 Yes Arrien, Jimmy Picket, MD  traZODone (DESYREL) 50 MG tablet TAKE 1-2 TABLETS (50-100 MG TOTAL) BY MOUTH AT BEDTIME AS NEEDED FOR SLEEP. Patient taking differently: Take 50-100 mg by mouth See admin instructions. Take  69m to 1030mby mouth at bedtime as needed for sleep 12/31/19  Yes Burns, StClaudina LickMD  vitamin B-12 (CYANOCOBALAMIN) 100 MCG tablet Take 100 mcg by mouth daily.   Yes [provider]  docusate sodium (COLACE) 100 MG capsule TAKE 1 CAPSULE (100 MG TOTAL) BY MOUTH DAILY AS NEEDED. Patient not taking: Reported on 09/01/2021 10/26/20 10/26/21  StLandis MartinsDPM  DULoxetine (CYMBALTA) 20 MG capsule Take 1 capsule (20 mg total) by mouth daily. Patient not taking: No sig reported 05/05/21 09/01/22  Arrien, MaJimmy PicketMD  metoprolol tartrate (LOPRESSOR) 50 MG tablet Take 1 tablet (50 mg total) by mouth 2 (two) times daily. Patient not taking: Reported on 09/01/2021 06/20/21   McTammi SouMD  ondansetron (ZOFRAN) 4 MG tablet Take 4 mg by mouth every 8 (eight) hours as needed for nausea or vomiting. Patient not taking: Reported on 09/01/2021    [provider]    Physical Exam: Vitals:   09/01/21 2135 09/01/21 2216 09/01/21 2330 09/02/21 0034  BP: (!) 149/112 (!) 156/80 (!) 157/110 (!) 157/110  Pulse: (!) 104 91 87 87  Resp: (!) 30 (!) 22    Temp:      TempSrc:      SpO2: 99% 95% 96%    Constitutional: Resting supine in bed, tremulous upper extremities and jaw otherwise NAD, calm, appropriate Eyes: PERRL, lids and conjunctivae normal ENMT: Mucous membranes are dry. Posterior pharynx clear of any exudate or lesions.Normal dentition.  Neck: normal, supple, no masses. Respiratory: clear to auscultation bilaterally, no wheezing, no crackles. Normal respiratory effort. No accessory muscle use.  Cardiovascular: Mildly tachycardic, no murmurs / rubs / gallops.  Trace bilateral lower extremity edema. 2+ pedal pulses. Abdomen: no tenderness, no masses palpated. No hepatosplenomegaly. Bowel sounds positive.  Musculoskeletal: no clubbing / cyanosis.  Good ROM, no contractures. Normal muscle tone.  Skin: Left plantar ulcer as pictured below, stable per patient.  Venous stasis  changes bilateral lower extremity, dry skin. Neurologic: Tremor bilateral upper extremities and jaw.  Rightward nystagmus otherwise CN 2-12 grossly intact. Sensation intact. Strength 5/5 in all 4.  Psychiatric: Normal judgment and insight. Alert and oriented x 3. Normal mood.      Labs on Admission: I have personally reviewed following labs and imaging studies  CBC: Recent Labs  Lab 09/01/21 2039  WBC 4.5  NEUTROABS 3.6  HGB 12.7*  HCT 37.8*  MCV 87.7  PLT  263*   Basic Metabolic Panel: Recent Labs  Lab 09/01/21 2039  NA 135  K 3.3*  CL 96*  CO2 27  GLUCOSE 112*  BUN 15  CREATININE 0.89  CALCIUM 8.8*  MG 1.3*   GFR: CrCl cannot be calculated (Unknown ideal weight.). Liver Function Tests: Recent Labs  Lab 09/01/21 2039  AST 72*  ALT 37  ALKPHOS 144*  BILITOT 2.1*  PROT 8.3*  ALBUMIN 3.7   No results for input(s): LIPASE, AMYLASE in the last 168 hours. No results for input(s): AMMONIA in the last 168 hours. Coagulation Profile: No results for input(s): INR, PROTIME in the last 168 hours. Cardiac Enzymes: No results for input(s): CKTOTAL, CKMB, CKMBINDEX, TROPONINI in the last 168 hours. BNP (last 3 results) Recent Labs    11/24/20 1343  PROBNP 26.0   HbA1C: No results for input(s): HGBA1C in the last 72 hours. CBG: No results for input(s): GLUCAP in the last 168 hours. Lipid Profile: No results for input(s): CHOL, HDL, LDLCALC, TRIG, CHOLHDL, LDLDIRECT in the last 72 hours. Thyroid Function Tests: No results for input(s): TSH, T4TOTAL, FREET4, T3FREE, THYROIDAB in the last 72 hours. Anemia Panel: No results for input(s): VITAMINB12, FOLATE, FERRITIN, TIBC, IRON, RETICCTPCT in the last 72 hours. Urine analysis:    Component Value Date/Time   COLORURINE AMBER (A) 04/28/2021 0230   APPEARANCEUR CLEAR 04/28/2021 0230   LABSPEC 1.012 04/28/2021 0230   PHURINE 6.0 04/28/2021 0230   GLUCOSEU NEGATIVE 04/28/2021 0230   HGBUR SMALL (A) 04/28/2021 0230    BILIRUBINUR NEGATIVE 04/28/2021 0230   KETONESUR NEGATIVE 04/28/2021 0230   PROTEINUR NEGATIVE 04/28/2021 0230   NITRITE NEGATIVE 04/28/2021 0230   LEUKOCYTESUR NEGATIVE 04/28/2021 0230    Radiological Exams on Admission: DG Thoracic Spine 2 View  Result Date: 09/01/2021 CLINICAL DATA:  Fall EXAM: THORACIC SPINE 2 VIEWS COMPARISON:  None. FINDINGS: Diffuse degenerative changes throughout the thoracic spine. No fracture or focal bone lesion. IMPRESSION: No acute bony abnormality. Electronically Signed   By: Rolm Baptise M.D.   On: 09/01/2021 18:42   DG Lumbar Spine Complete  Result Date: 09/01/2021 CLINICAL DATA:  Chronic back pain EXAM: LUMBAR SPINE - COMPLETE 4+ VIEW COMPARISON:  None. FINDINGS: Diffuse degenerative disc and facet disease. No fracture. Normal alignment. SI joints symmetric and unremarkable. Bilateral hip replacement partially imaged. Aortic atherosclerosis. No aneurysm. IMPRESSION: Degenerative changes.  No acute bony abnormality. Aortic atherosclerosis. Electronically Signed   By: Rolm Baptise M.D.   On: 09/01/2021 18:43   CT HEAD WO CONTRAST (5MM)  Result Date: 09/01/2021 CLINICAL DATA:  History of remote fall with headaches, history of tremors, initial encounter EXAM: CT HEAD WITHOUT CONTRAST TECHNIQUE: Contiguous axial images were obtained from the base of the skull through the vertex without intravenous contrast. COMPARISON:  None. FINDINGS: Brain: Very mild volume loss is noted commenced with the patient's given age. No findings to suggest acute hemorrhage, acute infarction or space-occupying mass lesion are noted. Vascular: No hyperdense vessel or unexpected calcification. Skull: Normal. Negative for fracture or focal lesion. Sinuses/Orbits: No acute finding. Other: None. IMPRESSION: Minimal atrophic changes are noted. No acute intracranial abnormality is seen. Electronically Signed   By: Inez Catalina M.D.   On: 09/01/2021 20:15    EKG: Not  performed.  Assessment/Plan Principal Problem:   Alcohol dependence with withdrawal (HCC) Active Problems:   OSA (obstructive sleep apnea)   Hypothyroidism   Chronic diastolic CHF (congestive heart failure) (HCC)   Diabetic foot ulcer ([place])  Type 2 diabetes mellitus with foot ulcer (HCC)   Hypokalemia   Hypomagnesemia   Thrombocytopenia (HCC)   Elevated LFTs   Murdock Jellison is a 60 y.o. male with medical history significant for alcohol use disorder, chronic diastolic CHF (Last EF 65-03%), T2DM, chronic diabetic foot ulcers on chronic antibiotic, HTN, hypothyroidism, depression, chronic back pain, obesity, OSA not using CPAP who is admitted with acute alcohol withdrawal.  Alcohol use disorder with acute withdrawal: Previous daily heavy alcohol use, last drink reportedly 2 days prior to admission.  Having significant tremors but no encephalopathy or seizure at time of admission. -Continue CIWA protocol with Ativan as needed  Chronic diastolic CHF: Stable, appears euvolemic.  Continue home Lasix 80 mg twice daily.  Hypokalemia/hypomagnesemia: Likely related to alcohol use.  Supplement IV magnesium followed by potassium and repeat labs in AM.  Type 2 diabetes: Last A1c 6.6%.  Place on sensitive SSI, repeat A1c.  Chronic diabetic left plantar ulcer/wound: Appears stable per patient.  Continue chronic doxycycline, Augmentin.  Continue to follow-up with wound care outpatient.  Thrombocytopenia: Mild, likely related to alcohol use.  Continue to monitor, hold pharmacologic VTE prophylaxis if worsening.  Elevated LFTs: Mild in the setting of alcohol use.  Hypothyroidism: Continue Synthroid.  Depression/anxiety: No longer taking Cymbalta.  Continue Atarax.  Chronic back pain: Continue gabapentin.  OSA: Not using CPAP due to intolerance.  Use supplemental oxygen as needed.  DVT prophylaxis: Subcutaneous heparin Code Status: Full code, confirmed with patient Family  Communication: Discussed with patient, he has discussed with family Disposition Plan: From home, dispo pending clinical progress Consults called: None Level of care: Stepdown Admission status:  Status is: Observation  The patient remains OBS appropriate and will d/c before 2 midnights.   Zada Finders MD [facility]  If 7PM-7AM, please contact night-coverage [email or web address]  09/02/2021, 12:37 AM

## 2021-09-01 NOTE — ED Provider Notes (Signed)
Emergency Department Provider Note   I have reviewed the triage vital signs and the nursing notes.   HISTORY  Chief Complaint Tremors   HPI [name] is a 60 y.o. male with past medical history reviewed below including diabetes, alcohol dependence, CHF, GERD, and chronic left foot wound presents to the emergency department with tremor and falling over the past 2 days since he stopped drinking.  Patient reports heavy alcohol use although he has cut back compared to his prior level of drinking.  He states that he has developed tremor and has been falling.  He states he has been feeling off balance.  During one of the falls he developed pain in his back which he feels like is making it even more difficult to walk.  He is not having any numbness or specific weakness in his legs.  Denies head injury.  His tremor and balance issues have worsened over the past 24 hours which prompted him to seek ED evaluation.  Denies prior history of seizure or known DTs.  He was admitted in July of this year for sepsis related to his foot infection.  He developed some withdrawal symptoms at that time which, per brief chart review, seem more mild and responded nicely to CIWA ativan. He denies pain in the feet, fever, chills, CP, SOB, or abdominal pain.    Past Medical History:  Diagnosis Date   Alcohol dependence ([place])    Arthritis    hips, hands   Bilateral carpal tunnel syndrome    Bilateral leg edema    Chronic   CHF (congestive heart failure) (HCC)    Diabetes mellitus without complication (HCC)    type 2   Diverticulitis    portion of colon removed   DOE (dyspnea on exertion)    occ   Elevated liver enzymes    Fatty liver    GERD (gastroesophageal reflux disease)    occ   Hammer toe    Hip pain    History of ventral hernia repair 2016   x2   Hyperlipidemia    pt unsure   Hypertension    Marijuana abuse    Morbid obesity ([place])    Neuromuscular disorder ([place])    peripheral neuropathy  feet and few fingers   OSA (obstructive sleep apnea)    has OSA-not used CPAP 2-3 yrs could not tolerate cpap   PONV (postoperative nausea and vomiting)    Toe ulcer ([place])    left healed    Patient Active Problem List   Diagnosis Date Noted   Alcohol dependence with withdrawal ([place]) 09/01/2021   Critical lower limb ischemia ([place]) 05/29/2021   Alcohol abuse 04/28/2021   Diabetic foot ulcer ([place]) 04/28/2021   Severe sepsis ([place]) 04/28/2021   Osteomyelitis ([place]) 04/27/2021   Anxiety 03/17/2021   Amputated toe of right foot ([place]) 10/62/6948   Alcoholic peripheral neuropathy ([place]) 10/28/2019   Chronic diastolic CHF (congestive heart failure) ([place]) 10/15/2019   Chronic foot ulcer ([place]) 54/62/7035   Alcoholic cirrhosis of liver without ascites ([place]) 04/27/2019   Obesity hypoventilation syndrome ([place]) 04/27/2019   Depression 04/25/2019   Severe alcohol use disorder (HCC)    DOE (dyspnea on exertion) 04/22/2019   Hypothyroidism 04/22/2019   Anemia 02/06/2019   Acute on chronic diastolic CHF (congestive heart failure) ([place]) 01/30/2019   Avascular necrosis of hip, left ([place]) 11/02/2018   Decreased hearing of both ears 10/30/2018   Avascular necrosis of hip, right ([place]) 08/16/2018   Bilateral  leg edema 02/27/2018   Marijuana abuse 02/16/2018   Ulcer of left heel ([place]) 12/21/2016   Bilateral carpal tunnel syndrome 12/15/2016   Hyperlipidemia 11/25/2016   Prediabetes 11/25/2016   Hypertension 11/23/2016   History of osteomyelitis 11/23/2016   Morbid obesity ([place]) 11/23/2016   OSA (obstructive sleep apnea) 11/23/2016   Other hammer toe (acquired) 11/11/2013   Exstrophy of bladder 11/11/2013    Past Surgical History:  Procedure Laterality Date   AMPUTATION TOE Left 05/25/2018   Procedure: AMPUTATION TOE left 3rd;  Surgeon: Wylene Simmer, MD;  Location: [facility];  Service: Orthopedics;  Laterality: Left;  62mn, to follow   AMPUTATION TOE Left 06/28/2018   Procedure:  Left foot revision 3rd toe amputation including 3rd metatarsal;  Surgeon: HWylene Simmer MD;  [place] [facility]  Service: Orthopedics;  Laterality: Left;  644m   BICEPS TENDON REPAIR Left 2014   Partial   COLONOSCOPY     GRAFT APPLICATION Right 02/19/24/9563 Procedure: GRAFT APPLICATION;  Surgeon: StLandis MartinsDPM;  [facility] Service: Podiatry;  Laterality: Right;   HERNIA REPAIR  2016   ventral   HIP CLOSED REDUCTION Right 09/01/2018   Procedure: CLOSED REDUCTION HIP;  Surgeon: RoNicholes StairsMD;  [facility];  Service: Orthopedics;  Laterality: Right;   INCISION AND DRAINAGE OF WOUND Right 03/03/2020   Procedure: IRRIGATION AND DEBRIDEMENT WOUND;  Surgeon: StLandis MartinsDPM;  [facility] Service: Podiatry;  Laterality: Right;   JOINT REPLACEMENT     b/l knees    METATARSAL HEAD EXCISION Right 03/03/2020   Procedure: IRRIGATION OF TOE AND CAUTERIZATION OF BLEEDING TOE;  Surgeon: StLandis MartinsDPM;  [place] Service: [place]iatry;  Laterality: Right;   METATARSAL HEAD EXCISION Right 03/03/2020   Procedure: METATARSAL HEAD EXCISION SECOND TOE RIGHT;  Surgeon: StLandis MartinsDPM;  [facility] Service: [facility]iatry;  Laterality: Right;  MAC WITH LOCAL   TOE AMPUTATION Right 09/2013   TOTAL HIP ARTHROPLASTY Right 08/16/2018   Procedure: TOTAL HIP ARTHROPLASTY ANTERIOR APPROACH;  Surgeon: SwRod CanMD;  [facility];  Service: Orthopedics;  Laterality: Right;   TOTAL HIP ARTHROPLASTY Left 11/02/2018   Procedure: TOTAL HIP ARTHROPLASTY ANTERIOR APPROACH;  Surgeon: SwRod CanMD;  [facility];  Service: Orthopedics;  Laterality: Left;   TOTAL KNEE ARTHROPLASTY     bilat    Allergies Claritin [loratadine]  Family History  Adopted: Yes  Family history unknown: Yes    Social History Social History   Tobacco Use   Smoking status: Former     Packs/day: 0.25    Years: 10.00    Pack years: 2.50    Types: Cigarettes   Smokeless tobacco: Never   Tobacco comments:    quit 2018  Vaping Use   Vaping Use: Never used  Substance Use Topics   Alcohol use: Not Currently    Comment: 2-3 drinks 4-5 nights a week, liquor   Drug use: Yes    Frequency: 1.0 times per week    Types: Marijuana    Comment: "4 times a month, maybe"    Review of Systems  Constitutional: No fever/chills. Positive fatigue, tremors, and gait instability.  Eyes: No visual changes. ENT: No sore throat. Cardiovascular: Denies chest pain. Respiratory: Denies shortness of breath. Gastrointestinal: No abdominal pain.  No nausea, no vomiting.  No diarrhea.  No constipation. Genitourinary: Negative for dysuria. Musculoskeletal:  Positive for back pain. Skin: Negative for rash. Neurological: Negative for headaches, focal weakness or numbness.  10-point ROS otherwise negative.  ____________________________________________   PHYSICAL EXAM:  VITAL SIGNS: ED Triage Vitals [09/01/21 1726]  Enc Vitals Group     BP (!) 140/113     Pulse Rate (!) 114     Resp 18     Temp 98.7 F (37.1 C)     Temp Source Oral     SpO2 95 %    Constitutional: Alert and oriented. Patient appears tremulous but not confused.  Eyes: Conjunctivae are normal. PERRL. Mild lateral nystagmus.  Head: Atraumatic. Nose: No congestion/rhinnorhea. Mouth/Throat: Mucous membranes are moist.  Neck: No stridor.  Cardiovascular: Tachycardia. Good peripheral circulation. Grossly normal heart sounds.   Respiratory: Normal respiratory effort.  No retractions. Lungs CTAB. Gastrointestinal: Soft and nontender. No distention.  Musculoskeletal: No lower extremity tenderness nor edema. No gross deformities of extremities. Neurologic:  Normal speech and language.  No facial asymmetry.  5/5 strength in the bilateral upper and lower extremities but significant coarse tremor noted throughout.  Skin:   Skin is warm, dry. Venous stasis dermatitis bilaterally.    ____________________________________________   LABS (all labs ordered are listed, but only abnormal results are displayed)  Labs Reviewed  COMPREHENSIVE METABOLIC PANEL - Abnormal; Notable for the following components:      Result Value   Potassium 3.3 (*)    Chloride 96 (*)    Glucose, Bld 112 (*)    Calcium 8.8 (*)    Total Protein 8.3 (*)    AST 72 (*)    Alkaline Phosphatase 144 (*)    Total Bilirubin 2.1 (*)    All other components within normal limits  CBC WITH DIFFERENTIAL/PLATELET - Abnormal; Notable for the following components:   Hemoglobin 12.7 (*)    HCT 37.8 (*)    Platelets 103 (*)    Lymphs Abs 0.5 (*)    All other components within normal limits  MAGNESIUM - Abnormal; Notable for the following components:   Magnesium 1.3 (*)    All other components within normal limits  RESP PANEL BY RT-PCR (FLU A&B, COVID) ARPGX2  ETHANOL  VITAMIN B1   ____________________________________________  RADIOLOGY  DG Thoracic Spine 2 View  Result Date: 09/01/2021 CLINICAL DATA:  Fall EXAM: THORACIC SPINE 2 VIEWS COMPARISON:  None. FINDINGS: Diffuse degenerative changes throughout the thoracic spine. No fracture or focal bone lesion. IMPRESSION: No acute bony abnormality. Electronically Signed   By: Rolm Baptise M.D.   On: 09/01/2021 18:42   DG Lumbar Spine Complete  Result Date: 09/01/2021 CLINICAL DATA:  Chronic back pain EXAM: LUMBAR SPINE - COMPLETE 4+ VIEW COMPARISON:  None. FINDINGS: Diffuse degenerative disc and facet disease. No fracture. Normal alignment. SI joints symmetric and unremarkable. Bilateral hip replacement partially imaged. Aortic atherosclerosis. No aneurysm. IMPRESSION: Degenerative changes.  No acute bony abnormality. Aortic atherosclerosis. Electronically Signed   By: Rolm Baptise M.D.   On: 09/01/2021 18:43   CT HEAD WO CONTRAST (5MM)  Result Date: 09/01/2021 CLINICAL DATA:  History of  remote fall with headaches, history of tremors, initial encounter EXAM: CT HEAD WITHOUT CONTRAST TECHNIQUE: Contiguous axial images were obtained from the base of the skull through the vertex without intravenous contrast. COMPARISON:  None. FINDINGS: Brain: Very mild volume loss is noted commenced with the patient's given age. No findings to suggest acute hemorrhage, acute infarction or space-occupying mass lesion are noted. Vascular: No hyperdense vessel or unexpected calcification.  Skull: Normal. Negative for fracture or focal lesion. Sinuses/Orbits: No acute finding. Other: None. IMPRESSION: Minimal atrophic changes are noted. No acute intracranial abnormality is seen. Electronically Signed   By: Inez Catalina M.D.   On: 09/01/2021 20:15    ____________________________________________   PROCEDURES  Procedure(s) performed:   Procedures  None  ____________________________________________   INITIAL IMPRESSION / ASSESSMENT AND PLAN / ED COURSE  Pertinent labs & imaging results that were available during my care of the patient were reviewed by me and considered in my medical decision making (see chart for details).   Patient presents to the emergency department with tremor and fall.  He has some description of balance difficulty which may be complicated by the pain in his back.  During the MSE process patient had plain films of the thoracic and lumbar spine ordered which are resulted and within normal limits with no fracture.  He was given 2 mg of PO ativan and continues to have elevated CIWA to 10.  Warnicke's is a consideration.  Plan for additional Ativan and will send a thiamine level along with 500 mg Thiamine dose.   Labs reviewed. No AKI. BIlirubin and alk phos are slightly elevated but no abdominal tenderness. No leukocytosis. Patient remains tremulous after additional ativan. Plan for admit. Thiamine given.   Discussed patient's case with [facility] to request admission. Patient and family (if  present) updated with plan. Care transferred to [facility] service.  I reviewed all nursing notes, vitals, pertinent old records, EKGs, labs, imaging (as available).  ____________________________________________  FINAL CLINICAL IMPRESSION(S) / ED DIAGNOSES  Final diagnoses:  Alcohol withdrawal syndrome with complication (HCC)    MEDICATIONS GIVEN DURING THIS VISIT:  Medications  LORazepam (ATIVAN) injection 0-4 mg (2 mg Intravenous Given 09/01/21 2053)    Or  LORazepam (ATIVAN) tablet 0-4 mg ( Oral See Alternative 09/01/21 2053)  LORazepam (ATIVAN) injection 0-4 mg (has no administration in time range)    Or  LORazepam (ATIVAN) tablet 0-4 mg (has no administration in time range)  LORazepam (ATIVAN) tablet 2 mg (2 mg Oral Given 09/01/21 1833)  sodium chloride 0.9 % bolus 1,000 mL (1,000 mLs Intravenous New Bag/Given 09/01/21 2100)  thiamine (B-1) injection 500 mg (500 mg Intravenous Given 09/01/21 2056)  morphine 4 MG/ML injection 4 mg (4 mg Intravenous Given 09/01/21 2210)     Note:  This document was prepared using Dragon voice recognition software and may include unintentional dictation errors.  Nanda Quinton, MD, [facility] Emergency Medicine    Yarah Fuente, [facility], MD 09/01/21 2251

## 2021-09-01 NOTE — ED Triage Notes (Signed)
Pt presents with c/o tremors secondary to alcohol withdrawal, last drink 2 days ago. Pt was drinking every day prior to that. Pt uncertain of exact amount that he used to daily drink. Pt has chronic back pain, not new in nature, did have a fall 7 weeks ago that flared up his chronic back pain.

## 2021-09-01 NOTE — ED Notes (Signed)
Patient is removing BP cuff and monitor. RN reinforced need to monitor VS and keep equipment on

## 2021-09-02 ENCOUNTER — Observation Stay (HOSPITAL_COMMUNITY): Payer: Medicare Other

## 2021-09-02 ENCOUNTER — Other Ambulatory Visit: Payer: Self-pay

## 2021-09-02 ENCOUNTER — Encounter (HOSPITAL_BASED_OUTPATIENT_CLINIC_OR_DEPARTMENT_OTHER): Payer: Medicare Other | Admitting: Physician Assistant

## 2021-09-02 DIAGNOSIS — F10230 Alcohol dependence with withdrawal, uncomplicated: Secondary | ICD-10-CM | POA: Diagnosis not present

## 2021-09-02 DIAGNOSIS — F10239 Alcohol dependence with withdrawal, unspecified: Secondary | ICD-10-CM | POA: Diagnosis present

## 2021-09-02 DIAGNOSIS — E11628 Type 2 diabetes mellitus with other skin complications: Secondary | ICD-10-CM | POA: Diagnosis present

## 2021-09-02 DIAGNOSIS — E11621 Type 2 diabetes mellitus with foot ulcer: Secondary | ICD-10-CM | POA: Diagnosis present

## 2021-09-02 DIAGNOSIS — E876 Hypokalemia: Secondary | ICD-10-CM | POA: Diagnosis present

## 2021-09-02 DIAGNOSIS — E669 Obesity, unspecified: Secondary | ICD-10-CM | POA: Diagnosis present

## 2021-09-02 DIAGNOSIS — Z20822 Contact with and (suspected) exposure to covid-19: Secondary | ICD-10-CM | POA: Diagnosis present

## 2021-09-02 DIAGNOSIS — E119 Type 2 diabetes mellitus without complications: Secondary | ICD-10-CM | POA: Diagnosis present

## 2021-09-02 DIAGNOSIS — L97429 Non-pressure chronic ulcer of left heel and midfoot with unspecified severity: Secondary | ICD-10-CM | POA: Diagnosis present

## 2021-09-02 DIAGNOSIS — E039 Hypothyroidism, unspecified: Secondary | ICD-10-CM | POA: Diagnosis present

## 2021-09-02 DIAGNOSIS — F419 Anxiety disorder, unspecified: Secondary | ICD-10-CM | POA: Diagnosis present

## 2021-09-02 DIAGNOSIS — E785 Hyperlipidemia, unspecified: Secondary | ICD-10-CM | POA: Diagnosis present

## 2021-09-02 DIAGNOSIS — R7989 Other specified abnormal findings of blood chemistry: Secondary | ICD-10-CM | POA: Diagnosis present

## 2021-09-02 DIAGNOSIS — E1142 Type 2 diabetes mellitus with diabetic polyneuropathy: Secondary | ICD-10-CM | POA: Diagnosis present

## 2021-09-02 DIAGNOSIS — K760 Fatty (change of) liver, not elsewhere classified: Secondary | ICD-10-CM | POA: Diagnosis present

## 2021-09-02 DIAGNOSIS — Z6835 Body mass index (BMI) 35.0-35.9, adult: Secondary | ICD-10-CM | POA: Diagnosis not present

## 2021-09-02 DIAGNOSIS — D696 Thrombocytopenia, unspecified: Secondary | ICD-10-CM | POA: Diagnosis present

## 2021-09-02 DIAGNOSIS — I5032 Chronic diastolic (congestive) heart failure: Secondary | ICD-10-CM | POA: Diagnosis present

## 2021-09-02 DIAGNOSIS — F32A Depression, unspecified: Secondary | ICD-10-CM | POA: Diagnosis present

## 2021-09-02 DIAGNOSIS — D72819 Decreased white blood cell count, unspecified: Secondary | ICD-10-CM | POA: Diagnosis present

## 2021-09-02 DIAGNOSIS — L97426 Non-pressure chronic ulcer of left heel and midfoot with bone involvement without evidence of necrosis: Secondary | ICD-10-CM | POA: Diagnosis not present

## 2021-09-02 DIAGNOSIS — J9601 Acute respiratory failure with hypoxia: Secondary | ICD-10-CM | POA: Diagnosis present

## 2021-09-02 DIAGNOSIS — L97509 Non-pressure chronic ulcer of other part of unspecified foot with unspecified severity: Secondary | ICD-10-CM | POA: Diagnosis present

## 2021-09-02 DIAGNOSIS — I110 Hypertensive heart disease with heart failure: Secondary | ICD-10-CM | POA: Diagnosis present

## 2021-09-02 DIAGNOSIS — D6959 Other secondary thrombocytopenia: Secondary | ICD-10-CM | POA: Diagnosis present

## 2021-09-02 DIAGNOSIS — E1169 Type 2 diabetes mellitus with other specified complication: Secondary | ICD-10-CM | POA: Diagnosis present

## 2021-09-02 DIAGNOSIS — F10231 Alcohol dependence with withdrawal delirium: Secondary | ICD-10-CM | POA: Diagnosis present

## 2021-09-02 DIAGNOSIS — M86672 Other chronic osteomyelitis, left ankle and foot: Secondary | ICD-10-CM | POA: Diagnosis present

## 2021-09-02 DIAGNOSIS — F1210 Cannabis abuse, uncomplicated: Secondary | ICD-10-CM | POA: Diagnosis present

## 2021-09-02 DIAGNOSIS — G4733 Obstructive sleep apnea (adult) (pediatric): Secondary | ICD-10-CM | POA: Diagnosis present

## 2021-09-02 LAB — BASIC METABOLIC PANEL
Anion gap: 12 (ref 5–15)
BUN: 13 mg/dL (ref 6–20)
CO2: 26 mmol/L (ref 22–32)
Calcium: 8.7 mg/dL — ABNORMAL LOW (ref 8.9–10.3)
Chloride: 99 mmol/L (ref 98–111)
Creatinine, Ser: 0.81 mg/dL (ref 0.61–1.24)
GFR, Estimated: >60 mL/min (ref >60)
Glucose, Bld: 126 mg/dL — ABNORMAL HIGH (ref 70–99)
Potassium: 2.7 mmol/L — CL (ref 3.5–5.1)
Sodium: 137 mmol/L (ref 135–145)

## 2021-09-02 LAB — CBC
HCT: 37.4 % — ABNORMAL LOW (ref 39.0–52.0)
Hemoglobin: 12.5 g/dL — ABNORMAL LOW (ref 13.0–17.0)
MCH: 29.6 pg (ref 26.0–34.0)
MCHC: 33.4 g/dL (ref 30.0–36.0)
MCV: 88.6 fL (ref 80.0–100.0)
Platelets: 97 10*3/uL — ABNORMAL LOW (ref 150–400)
RBC: 4.22 MIL/uL (ref 4.22–5.81)
RDW: 15.1 % (ref 11.5–15.5)
WBC: 3.3 10*3/uL — ABNORMAL LOW (ref 4.0–10.5)
nRBC: 0 % (ref 0.0–0.2)

## 2021-09-02 LAB — COMPREHENSIVE METABOLIC PANEL
ALT: 33 U/L (ref 0–44)
AST: 58 U/L — ABNORMAL HIGH (ref 15–41)
Albumin: 3.4 g/dL — ABNORMAL LOW (ref 3.5–5.0)
Alkaline Phosphatase: 126 U/L (ref 38–126)
Anion gap: 12 (ref 5–15)
BUN: 13 mg/dL (ref 6–20)
CO2: 27 mmol/L (ref 22–32)
Calcium: 8.6 mg/dL — ABNORMAL LOW (ref 8.9–10.3)
Chloride: 98 mmol/L (ref 98–111)
Creatinine, Ser: 0.74 mg/dL (ref 0.61–1.24)
GFR, Estimated: >60 mL/min (ref >60)
Glucose, Bld: 85 mg/dL (ref 70–99)
Potassium: 2.8 mmol/L — ABNORMAL LOW (ref 3.5–5.1)
Sodium: 137 mmol/L (ref 135–145)
Total Bilirubin: 2.1 mg/dL — ABNORMAL HIGH (ref 0.3–1.2)
Total Protein: 7.5 g/dL (ref 6.5–8.1)

## 2021-09-02 LAB — GLUCOSE, CAPILLARY: Glucose-Capillary: 109 mg/dL — ABNORMAL HIGH (ref 70–99)

## 2021-09-02 LAB — CBG MONITORING, ED
Glucose-Capillary: 90 mg/dL (ref 70–99)
Glucose-Capillary: 138 mg/dL — ABNORMAL HIGH (ref 70–99)
Glucose-Capillary: 114 mg/dL — ABNORMAL HIGH (ref 70–99)

## 2021-09-02 LAB — MAGNESIUM
Magnesium: 1.7 mg/dL (ref 1.7–2.4)
Magnesium: 1.6 mg/dL — ABNORMAL LOW (ref 1.7–2.4)

## 2021-09-02 LAB — PROTIME-INR
INR: 1.2 (ref 0.8–1.2)
Prothrombin Time: 14.9 seconds (ref 11.4–15.2)

## 2021-09-02 LAB — MRSA NEXT GEN BY PCR, NASAL: MRSA by PCR Next Gen: NOT DETECTED

## 2021-09-02 LAB — HEMOGLOBIN A1C
Hgb A1c MFr Bld: 6.2 % — ABNORMAL HIGH (ref 4.8–5.6)
Mean Plasma Glucose: 131.24 mg/dL

## 2021-09-02 MED ORDER — DOXYCYCLINE HYCLATE 100 MG PO TABS
100.0000 mg | ORAL_TABLET | Freq: Two times a day (BID) | ORAL | Status: DC
  Administered 2021-09-02 – 2021-09-03 (×4): 100 mg via ORAL
  Filled 2021-09-02 (×5): qty 1

## 2021-09-02 MED ORDER — FUROSEMIDE 40 MG PO TABS
80.0000 mg | ORAL_TABLET | Freq: Two times a day (BID) | ORAL | Status: DC
  Administered 2021-09-02 – 2021-09-05 (×7): 80 mg via ORAL
  Filled 2021-09-02 (×7): qty 2

## 2021-09-02 MED ORDER — AMOXICILLIN-POT CLAVULANATE 875-125 MG PO TABS
1.0000 | ORAL_TABLET | Freq: Two times a day (BID) | ORAL | Status: DC
  Administered 2021-09-02 – 2021-09-03 (×4): 1 via ORAL
  Filled 2021-09-02 (×4): qty 1

## 2021-09-02 MED ORDER — MAGNESIUM SULFATE 2 GM/50ML IV SOLN
2.0000 g | INTRAVENOUS | Status: AC
  Administered 2021-09-02: 2 g via INTRAVENOUS
  Filled 2021-09-02: qty 50

## 2021-09-02 MED ORDER — HYDRALAZINE HCL 20 MG/ML IJ SOLN
5.0000 mg | Freq: Four times a day (QID) | INTRAMUSCULAR | Status: DC | PRN
  Administered 2021-09-02 – 2021-09-04 (×3): 5 mg via INTRAVENOUS
  Filled 2021-09-02 (×4): qty 1

## 2021-09-02 MED ORDER — ORAL CARE MOUTH RINSE
15.0000 mL | Freq: Two times a day (BID) | OROMUCOSAL | Status: DC
  Administered 2021-09-02 – 2021-09-04 (×5): 15 mL via OROMUCOSAL

## 2021-09-02 MED ORDER — CHLORHEXIDINE GLUCONATE CLOTH 2 % EX PADS
6.0000 | MEDICATED_PAD | Freq: Every day | CUTANEOUS | Status: DC
  Administered 2021-09-02 – 2021-09-04 (×4): 6 via TOPICAL

## 2021-09-02 MED ORDER — ENOXAPARIN SODIUM 40 MG/0.4ML IJ SOSY
40.0000 mg | PREFILLED_SYRINGE | INTRAMUSCULAR | Status: DC
  Administered 2021-09-02 – 2021-09-04 (×3): 40 mg via SUBCUTANEOUS
  Filled 2021-09-02 (×3): qty 0.4

## 2021-09-02 MED ORDER — INSULIN ASPART 100 UNIT/ML IJ SOLN
0.0000 [IU] | Freq: Three times a day (TID) | INTRAMUSCULAR | Status: DC
  Administered 2021-09-02: 1 [IU] via SUBCUTANEOUS
  Filled 2021-09-02: qty 0.09

## 2021-09-02 MED ORDER — GABAPENTIN 300 MG PO CAPS
300.0000 mg | ORAL_CAPSULE | Freq: Two times a day (BID) | ORAL | Status: DC
  Administered 2021-09-02 – 2021-09-05 (×8): 300 mg via ORAL
  Filled 2021-09-02 (×8): qty 1

## 2021-09-02 MED ORDER — METOPROLOL TARTRATE 12.5 MG HALF TABLET
12.5000 mg | ORAL_TABLET | Freq: Two times a day (BID) | ORAL | Status: DC
  Administered 2021-09-02 (×2): 12.5 mg via ORAL
  Filled 2021-09-02 (×2): qty 1

## 2021-09-02 MED ORDER — LEVOTHYROXINE SODIUM 50 MCG PO TABS
50.0000 ug | ORAL_TABLET | Freq: Every day | ORAL | Status: DC
  Administered 2021-09-02 – 2021-09-05 (×4): 50 ug via ORAL
  Filled 2021-09-02 (×4): qty 1

## 2021-09-02 MED ORDER — POTASSIUM CHLORIDE 10 MEQ/100ML IV SOLN
10.0000 meq | INTRAVENOUS | Status: AC
  Administered 2021-09-02 (×3): 10 meq via INTRAVENOUS
  Filled 2021-09-02 (×3): qty 100

## 2021-09-02 MED ORDER — HYDROXYZINE HCL 25 MG PO TABS
25.0000 mg | ORAL_TABLET | Freq: Two times a day (BID) | ORAL | Status: DC
  Administered 2021-09-02 – 2021-09-05 (×7): 25 mg via ORAL
  Filled 2021-09-02 (×7): qty 1

## 2021-09-02 NOTE — ED Notes (Signed)
Pt provided with a cup of water.

## 2021-09-02 NOTE — Consult Note (Signed)
WOC Nurse Consult Note: Patient receiving care in [facility] ED 24. Reason for Consult: chronic left foot plantar surface wound. Normally is followed by providers at [facility]. Wound type: Chronic diabetic foot wound Pressure Injury POA: Yes/No/NA Measurement: To be provided by the bedside RN in the flowsheet section  Wound bed: see photo Drainage (amount, consistency, odor) to be determined Periwound:  intact Dressing procedure/placement/frequency: Wash left foot wound with saline, pat dry. Place Aquacel [place] [phone]) into the wound bed, cover with dry gauze, secure with kerlix. Change daily. WOC nurse will not follow at this time.  Please re-consult the WOC team if needed.  Helmut Muster, RN, MSN, CWOCN, CNS-BC, pager [phone]

## 2021-09-02 NOTE — Progress Notes (Signed)
PROGRESS NOTE  [name] CVE:[id number] DOB: 09-07-1961 DOA: 09/01/2021 PCP: [name], MD  HPI/Recap of past 24 hours: [name] is a 60 y.o. male with medical history significant for alcohol use disorder, chronic diastolic CHF (Last EF 60-65%), T2DM, diabetic foot ulcers, HTN, hypothyroidism, chronic depression/anxiety, chronic back pain, obesity, OSA not using CPAP who presented to the ED for management of alcohol withdrawal, associated with tremors and impaired balance.  Has reported a couple of falls for the past 2 weeks.  He fell prior to admission, after well-developed pain in his lower back.  Also reports a chronic infected left foot wound for which she follows with wound clinic, he is on Augmentin and doxycycline chronically.  09/02/2021: Patient was seen and examined at his bedside.  His wife was present in the room.  Mild tremors noted on exam.  His last alcohol intake was 3 days ago, still in the window for alcohol withdrawal.  Reports that he will miss his appointment at the wound care clinic which was scheduled today.  We will consult wound care specialist for further instructions on local wound care.    He is currently on oxygen supplementation, 3 L to maintain a saturation greater than 92%.  Not on oxygen supplementation at baseline.  Assessment/Plan: Principal Problem:   Alcohol dependence with withdrawal (HCC) Active Problems:   OSA (obstructive sleep apnea)   Hypothyroidism   Chronic diastolic CHF (congestive heart failure) (HCC)   Diabetic foot ulcer (HCC)   Type 2 diabetes mellitus with foot ulcer (HCC)   Hypokalemia   Hypomagnesemia   Thrombocytopenia (HCC)   Elevated LFTs  Alcohol use disorder with acute withdrawal. Has been tachycardic and hypertensive while in the ED, likely related to alcohol withdrawal. Last alcohol intake was on 08/30/2021. Still in the window for alcohol withdrawal. Continue CIWA protocol Continue multivitamins, thiamine and  folic acid supplement. Patient is committed to sobriety and to maintain it. Continue to closely monitor  Essential hypertension, BP is not at goal, elevated Started Lopressor 12.5 mg twice daily Continue to treat alcohol withdrawal Continue to closely monitor vital signs.  Sinus tachycardia, suspect related to alcohol withdrawal Last TSH 0.787 on 04/28/21 Continue to treat alcohol withdrawal.  Acute hypoxic respiratory failure, unclear etiology Not on oxygen supplementation at baseline Currently on 3 L to maintain a saturation greater than 92% Obtain a chest x-ray to further assess  Chronic left foot wound, follows with wound clinic He is on Augmentin and doxycycline chronically Wound care specialist consulted to assist with the management Continue local wound care Obtain MRSA screening test.  Type 2 diabetes with hyperglycemia Hemoglobin A1c 6.2 on 09/02/2021 Continue insulin sliding scale Avoid hypoglycemia.  Diabetic polyneuropathy Resume home gabapentin Fall precautions  Hypokalemia Serum potassium 2.7 Repleted intravenously. Repeat BMP in the morning  Hypomagnesemia Serum magnesium 1.6 Repleted intravenously Repeat magnesium in the morning  Leukopenia/thrombocytopenia suspect secondary to acute illness WBC 3.3, platelet 97 Monitor for now Repeat CBC in the morning.  Ambulatory dysfunction with recurrent falls PT OT assessment Fall precautions   Code Status: Full code  Family Communication: Wife at bedside  Disposition Plan: Likely will discharge to home once hemodynamically stable.   Consultants: None  Procedures: None  Antimicrobials: Augmentin Doxycycline  DVT prophylaxis: Subcu heparin 3 times daily, will change to subcu Lovenox daily.  Status is: Observation         Objective: Vitals:   09/02/21 0743 09/02/21 0815 09/02/21 1000 09/02/21 1300  BP:  Marland Kitchen)  188/98 (!) 166/97 (!) 191/135  Pulse:  87 92 100  Resp:  20 (!) 23 18   Temp:      TempSrc:      SpO2:  (!) 78% 94% 96%  Weight: 123 kg     Height: 6\' 1"  (1.854 m)       Intake/Output Summary (Last 24 hours) at 09/02/2021 1429 Last data filed at 09/02/2021 1043 Gross per 24 hour  Intake 1152 ml  Output --  Net 1152 ml   Filed Weights   09/02/21 0743  Weight: 123 kg    Exam:  General: 60 y.o. year-old male well developed well nourished in no acute distress.  Alert and oriented x3. Cardiovascular: Regular rate and rhythm with no rubs or gallops.  No thyromegaly or JVD noted.   Respiratory: Clear to auscultation with no wheezes or rales. Good inspiratory effort. Abdomen: Soft nontender nondistended with normal bowel sounds x4 quadrants. Musculoskeletal: Trace lower extremity edema. 2/4 pulses in all 4 extremities. Skin:   Psychiatry: Mood is appropriate for condition and setting   Data Reviewed: CBC: Recent Labs  Lab 09/01/21 2039 09/02/21 0339  WBC 4.5 3.3*  NEUTROABS 3.6  --   HGB 12.7* 12.5*  HCT 37.8* 37.4*  MCV 87.7 88.6  PLT 103* 97*   Basic Metabolic Panel: Recent Labs  Lab 09/01/21 2039 09/02/21 0339 09/02/21 1245  NA 135 137 137  K 3.3* 2.8* 2.7*  CL 96* 98 99  CO2 27 27 26   GLUCOSE 112* 85 126*  BUN 15 13 13   CREATININE 0.89 0.74 0.81  CALCIUM 8.8* 8.6* 8.7*  MG 1.3* 1.7 1.6*   GFR: Estimated Creatinine Clearance: 133.2 mL/min (by C-G formula based on SCr of 0.81 mg/dL). Liver Function Tests: Recent Labs  Lab 09/01/21 2039 09/02/21 0339  AST 72* 58*  ALT 37 33  ALKPHOS 144* 126  BILITOT 2.1* 2.1*  PROT 8.3* 7.5  ALBUMIN 3.7 3.4*   No results for input(s): LIPASE, AMYLASE in the last 168 hours. No results for input(s): AMMONIA in the last 168 hours. Coagulation Profile: Recent Labs  Lab 09/02/21 0339  INR 1.2   Cardiac Enzymes: No results for input(s): CKTOTAL, CKMB, CKMBINDEX, TROPONINI in the last 168 hours. BNP (last 3 results) Recent Labs    11/24/20 1343  PROBNP 26.0   HbA1C: Recent  Labs    09/02/21 0339  HGBA1C 6.2*   CBG: Recent Labs  Lab 09/02/21 0820 09/02/21 1159  GLUCAP 90 138*   Lipid Profile: No results for input(s): CHOL, HDL, LDLCALC, TRIG, CHOLHDL, LDLDIRECT in the last 72 hours. Thyroid Function Tests: No results for input(s): TSH, T4TOTAL, FREET4, T3FREE, THYROIDAB in the last 72 hours. Anemia Panel: No results for input(s): VITAMINB12, FOLATE, FERRITIN, TIBC, IRON, RETICCTPCT in the last 72 hours. Urine analysis:    Component Value Date/Time   COLORURINE AMBER (A) 04/28/2021 0230   APPEARANCEUR CLEAR 04/28/2021 0230   LABSPEC 1.012 04/28/2021 0230   PHURINE 6.0 04/28/2021 0230   GLUCOSEU NEGATIVE 04/28/2021 0230   HGBUR SMALL (A) 04/28/2021 0230   BILIRUBINUR NEGATIVE 04/28/2021 0230   KETONESUR NEGATIVE 04/28/2021 0230   PROTEINUR NEGATIVE 04/28/2021 0230   NITRITE NEGATIVE 04/28/2021 0230   LEUKOCYTESUR NEGATIVE 04/28/2021 0230   Sepsis Labs: @LABRCNTIP (procalcitonin:4,lacticidven:4)  ) Recent Results (from the past 240 hour(s))  Resp Panel by RT-PCR (Flu A&B, Covid) Nasopharyngeal Swab     Status: None   Collection Time: 09/01/21 10:15 PM   Specimen: Nasopharyngeal Swab; Nasopharyngeal(NP)  swabs in vial transport medium  Result Value Ref Range Status   SARS Coronavirus 2 by RT PCR NEGATIVE NEGATIVE Final    Comment: (NOTE) SARS-CoV-2 target nucleic acids are NOT DETECTED.  The SARS-CoV-2 RNA is generally detectable in upper respiratory specimens during the acute phase of infection. The lowest concentration of SARS-CoV-2 viral copies this assay can detect is 138 copies/mL. A negative result does not preclude SARS-Cov-2 infection and should not be used as the sole basis for treatment or other patient management decisions. A negative result may occur with  improper specimen collection/handling, submission of specimen other than nasopharyngeal swab, presence of viral mutation(s) within the areas targeted by this assay, and  inadequate number of viral copies(<138 copies/mL). A negative result must be combined with clinical observations, patient history, and epidemiological information. The expected result is Negative.  Fact Sheet for Patients:  BloggerCourse.com  Fact Sheet for Healthcare Providers:  SeriousBroker.it  This test is no t yet approved or cleared by the [place] FDA and  has been authorized for detection and/or diagnosis of SARS-CoV-2 by FDA under an Emergency Use Authorization (EUA). This EUA will remain  in effect (meaning this test can be used) for the duration of the COVID-19 declaration under Section 564(b)(1) of the Act, 21 U.S.C.section 360bbb-3(b)(1), unless the authorization is terminated  or revoked sooner.       Influenza A by PCR NEGATIVE NEGATIVE Final   Influenza B by PCR NEGATIVE NEGATIVE Final    Comment: (NOTE) The Xpert Xpress SARS-CoV-2/FLU/RSV plus assay is intended as an aid in the diagnosis of influenza from Nasopharyngeal swab specimens and should not be used as a sole basis for treatment. Nasal washings and aspirates are unacceptable for Xpert Xpress SARS-CoV-2/FLU/RSV testing.  Fact Sheet for Patients: BloggerCourse.com  Fact Sheet for Healthcare Providers: SeriousBroker.it  This test is not yet approved or cleared by the [place] FDA and has been authorized for detection and/or diagnosis of SARS-CoV-2 by FDA under an Emergency Use Authorization (EUA). This EUA will remain in effect (meaning this test can be used) for the duration of the COVID-19 declaration under Section 564(b)(1) of the Act, 21 U.S.C. section 360bbb-3(b)(1), unless the authorization is terminated or revoked.  Performed at [facility], [address] [place], [place] [address]       Studies: DG Thoracic Spine 2 View  Result Date: 09/01/2021 CLINICAL  DATA:  Fall EXAM: THORACIC SPINE 2 VIEWS COMPARISON:  None. FINDINGS: Diffuse degenerative changes throughout the thoracic spine. No fracture or focal bone lesion. IMPRESSION: No acute bony abnormality. Electronically Signed   By: Charlett Nose M.D.   On: 09/01/2021 18:42   DG Lumbar Spine Complete  Result Date: 09/01/2021 CLINICAL DATA:  Chronic back pain EXAM: LUMBAR SPINE - COMPLETE 4+ VIEW COMPARISON:  None. FINDINGS: Diffuse degenerative disc and facet disease. No fracture. Normal alignment. SI joints symmetric and unremarkable. Bilateral hip replacement partially imaged. Aortic atherosclerosis. No aneurysm. IMPRESSION: Degenerative changes.  No acute bony abnormality. Aortic atherosclerosis. Electronically Signed   By: Charlett Nose M.D.   On: 09/01/2021 18:43   CT HEAD WO CONTRAST ( )  Result Date: 09/01/2021 CLINICAL DATA:  History of remote fall with headaches, history of tremors, initial encounter EXAM: CT HEAD WITHOUT CONTRAST TECHNIQUE: Contiguous axial images were obtained from the base of the skull through the vertex without intravenous contrast. COMPARISON:  None. FINDINGS: Brain: Very mild volume loss is noted commenced with the patient's given age. No findings to  suggest acute hemorrhage, acute infarction or space-occupying mass lesion are noted. Vascular: No hyperdense vessel or unexpected calcification. Skull: Normal. Negative for fracture or focal lesion. Sinuses/Orbits: No acute finding. Other: None. IMPRESSION: Minimal atrophic changes are noted. No acute intracranial abnormality is seen. Electronically Signed   By: Alcide Clever M.D.   On: 09/01/2021 20:15    Scheduled Meds:  amoxicillin-clavulanate  1 tablet Oral BID   doxycycline  100 mg Oral BID   folic acid  1 mg Oral Daily   furosemide  80 mg Oral BID   gabapentin  300 mg Oral BID   heparin  5,000 Units Subcutaneous Q8H   hydrOXYzine  25 mg Oral BID   insulin aspart  0-9 Units Subcutaneous TID WC   levothyroxine  50  mcg Oral Q0600   metoprolol tartrate  12.5 mg Oral BID   multivitamin with minerals  1 tablet Oral Daily   sodium chloride flush  3 mL Intravenous Q12H   thiamine  100 mg Oral Daily   Or   thiamine  100 mg Intravenous Daily    Continuous Infusions:  magnesium sulfate bolus IVPB 2 g (09/02/21 1408)   Followed by   potassium chloride     potassium chloride 10 mEq (09/02/21 1348)     LOS: 0 days     Darlin Drop, MD [facility] Pager [phone]  If 7PM-7AM, please contact night-coverage [email or web address] Password [facility] 09/02/2021, 2:29 PM

## 2021-09-02 NOTE — ED Notes (Signed)
Patient is asleep.  

## 2021-09-02 NOTE — ED Notes (Signed)
Patient is very difficult stick. Only 1 IV by IV RN. Magnesium started, will start Potassium after Magnesium has completed

## 2021-09-02 NOTE — ED Notes (Addendum)
No channels available for K+, Portable Equipment called and they have none available.

## 2021-09-02 NOTE — TOC CAGE-AID Note (Signed)
Transition of Care [facility]) - CAGE-AID Screening   Patient Details  Name: [name] MRN: [id number] Date of Birth: 23-Jul-1961  [facility] of [facility]) CM/SW Contact:    Joanne Chars, LCSW Phone Number: 09/02/2021, 7:40 PM   Clinical Narrative:CSW met with pt to complete Cage Aid.  Pt reports he attended residential treatment earlier this year and was sober for 90 days afterwards.  He started drinking again about a month ago, currently drinking 4-5 liquor drinks per day.  Also reports marijuana use 1-2x week.  Pt aware that his ETOH use is causing problems and wanting to get sober again.  Pt has not made up his mind about pursuing additional formal treatment.  Pt does not like AA and this is not option for him.  CSW provided contact list of outpatient and residential treatment providers.  Pt engaged in discussion and verbalized understanding of treatment options.     CAGE-AID Screening:    Have You Ever Felt You Ought to Cut Down on Your Drinking or Drug Use?: Yes Have People Annoyed You By Critizing Your Drinking Or Drug Use?: No Have You Felt Bad Or Guilty About Your Drinking Or Drug Use?: Yes Have You Ever Had a Drink or Used Drugs First Thing In The Morning to Steady Your Nerves or to Get Rid of a Hangover?: Yes CAGE-AID Score: 3  Substance Abuse Education Offered: Yes  Substance abuse interventions: Patient Counseling, Other (must comment) Civil engineer, contracting)

## 2021-09-02 NOTE — TOC Initial Note (Signed)
Transition of Care [facility]) - Initial/Assessment Note    Patient Details  Name: [name] MRN: [id number] Date of Birth: July 04, 1961  Transition [facility]) CM/SW Contact:    Joanne Chars, LCSW Phone Number: 09/02/2021, 7:45 PM  Clinical Narrative:   CSW met with pt for initial assessment and Cage Aid. (See separate note)  Pt lives at home with girlfriend [name], permission given to speak with her.  [name] DME in home: 2 walkers, wheelchair, bedside commode, raised toilet seat.  PCP in place. Pt informed that CSW/Case management will follow up with him on the floor when further needs are identified.                  Expected Discharge Plan:  (TBD) Barriers to Discharge: Continued Medical Work up   Patient Goals and CMS Choice Patient states their goals for this hospitalization and ongoing recovery are:: "get back on my feet, want my back to feel better"      Expected Discharge Plan and Services Expected Discharge Plan:  (TBD) In-house Referral: Clinical Social Work   Post Acute Care Choice:  (TBD) Living arrangements for the past 2 months: Single Family Home                                      Prior Living Arrangements/Services Living arrangements for the past 2 months: Single Family Home Lives with:: Significant Other Patient language and need for interpreter reviewed:: Yes Do you feel safe going back to the place where you live?: Yes      Need for Family Participation in Patient Care: No (Comment) Care giver support system in place?: Yes (comment) [name] home services: Other (comment) (none) Criminal Activity/Legal Involvement Pertinent to [name] Situation/Hospitalization: No - Comment as needed  Activities of Daily Living Home Assistive Devices/Equipment: Walker (specify type), Shower chair with back, Cane (specify quad or straight), Other (Comment) (walk-in shower, single point cane, 4 wheeled walker, standard height toilet) ADL Screening  (condition at time of admission) Patient's cognitive ability adequate to safely complete daily activities?: Yes Is the patient deaf or have difficulty hearing?: No Does the patient have difficulty seeing, even when wearing glasses/contacts?: No Does the patient have difficulty concentrating, remembering, or making decisions?: No Patient able to express need for assistance with ADLs?: Yes Does the patient have difficulty dressing or bathing?: No Independently performs ADLs?: Yes (appropriate for developmental age) Does the patient have difficulty walking or climbing stairs?: Yes (secondary to tremors, chronic back pain and weakness) Weakness of Legs: Both  Permission Sought/Granted Permission sought to share information with : Family Supports Permission granted to share information with : Yes, Verbal Permission Granted  Share Information with NAME: girlfriend [name]           Emotional Assessment Appearance:: Appears stated age Attitude/Demeanor/Rapport: Engaged Affect (typically observed): Appropriate, Pleasant Orientation: : Oriented to Self, Oriented to Place, Oriented to  Time, Oriented to Situation Alcohol / Substance Use: Alcohol Use, Illicit Drugs Psych Involvement: No (comment)  Admission diagnosis:  Alcohol dependence with withdrawal ([place]) [F10.239] Patient Active Problem List   Diagnosis Date Noted   Type 2 diabetes mellitus with foot ulcer ([place]) 09/02/2021   Hypokalemia 09/02/2021   Hypomagnesemia 09/02/2021   Thrombocytopenia ([place]) 09/02/2021   Elevated LFTs 09/02/2021   Alcohol dependence with withdrawal ([place]) 09/01/2021   Critical lower limb ischemia ([place]) 05/29/2021   Alcohol abuse 04/28/2021  Diabetic foot ulcer ([place]) 04/28/2021   Severe sepsis ([place]) 04/28/2021   Osteomyelitis ([place]) 04/27/2021   Anxiety 03/17/2021   Amputated toe of right foot ([place]) 63/87/5643   Alcoholic peripheral neuropathy ([place]) 10/28/2019   Chronic diastolic CHF (congestive heart  failure) ([place]) 10/15/2019   Chronic foot ulcer ([place]) 32/95/1884   Alcoholic cirrhosis of liver without ascites ([place]) 04/27/2019   Obesity hypoventilation syndrome ([place]) 04/27/2019   Depression 04/25/2019   Severe alcohol use disorder ([place])    DOE (dyspnea on exertion) 04/22/2019   Hypothyroidism 04/22/2019   Anemia 02/06/2019   Acute on chronic diastolic CHF (congestive heart failure) ([place]) 01/30/2019   Avascular necrosis of hip, left ([place]) 11/02/2018   Decreased hearing of both ears 10/30/2018   Avascular necrosis of hip, right ([place]) 08/16/2018   Bilateral leg edema 02/27/2018   Marijuana abuse 02/16/2018   Ulcer of left heel ([place]) 12/21/2016   Bilateral carpal tunnel syndrome 12/15/2016   Hyperlipidemia 11/25/2016   Prediabetes 11/25/2016   Hypertension 11/23/2016   History of osteomyelitis 11/23/2016   Morbid obesity ([place]) 11/23/2016   OSA (obstructive sleep apnea) 11/23/2016   Other hammer toe (acquired) 11/11/2013   Exstrophy of bladder 11/11/2013   PCP:  Binnie Rail, MD Pharmacy:   [id number] [address] [id number]: [phone]: [phone]    Social Determinants of Health (SDOH) Interventions    Readmission Risk Interventions Readmission Risk Prevention Plan 03/04/2020  Transportation Screening Complete  PCP or Specialist Appt within 3-5 Days Complete  HRI or HYoderComplete  Social Work Consult for RStowellPlanning/Counseling Complete  Palliative Care Screening Complete  Medication Review (Press photographer Complete  Some recent data might be hidden

## 2021-09-02 NOTE — ED Notes (Signed)
Pt resting comfortably in bed at this time, area secured with bed in lowest position.

## 2021-09-03 DIAGNOSIS — L97426 Non-pressure chronic ulcer of left heel and midfoot with bone involvement without evidence of necrosis: Secondary | ICD-10-CM

## 2021-09-03 DIAGNOSIS — F10230 Alcohol dependence with withdrawal, uncomplicated: Secondary | ICD-10-CM

## 2021-09-03 DIAGNOSIS — E11621 Type 2 diabetes mellitus with foot ulcer: Secondary | ICD-10-CM

## 2021-09-03 LAB — COMPREHENSIVE METABOLIC PANEL
ALT: 29 U/L (ref 0–44)
AST: 45 U/L — ABNORMAL HIGH (ref 15–41)
Albumin: 3.4 g/dL — ABNORMAL LOW (ref 3.5–5.0)
Alkaline Phosphatase: 122 U/L (ref 38–126)
Anion gap: 11 (ref 5–15)
BUN: 14 mg/dL (ref 6–20)
CO2: 26 mmol/L (ref 22–32)
Calcium: 8.5 mg/dL — ABNORMAL LOW (ref 8.9–10.3)
Chloride: 98 mmol/L (ref 98–111)
Creatinine, Ser: 0.86 mg/dL (ref 0.61–1.24)
GFR, Estimated: >60 mL/min (ref >60)
Glucose, Bld: 94 mg/dL (ref 70–99)
Potassium: 2.9 mmol/L — ABNORMAL LOW (ref 3.5–5.1)
Sodium: 135 mmol/L (ref 135–145)
Total Bilirubin: 1.9 mg/dL — ABNORMAL HIGH (ref 0.3–1.2)
Total Protein: 7.3 g/dL (ref 6.5–8.1)

## 2021-09-03 LAB — CBC
HCT: 35.9 % — ABNORMAL LOW (ref 39.0–52.0)
Hemoglobin: 12 g/dL — ABNORMAL LOW (ref 13.0–17.0)
MCH: 30.1 pg (ref 26.0–34.0)
MCHC: 33.4 g/dL (ref 30.0–36.0)
MCV: 90 fL (ref 80.0–100.0)
Platelets: 89 10*3/uL — ABNORMAL LOW (ref 150–400)
RBC: 3.99 MIL/uL — ABNORMAL LOW (ref 4.22–5.81)
RDW: 15.2 % (ref 11.5–15.5)
WBC: 3.4 10*3/uL — ABNORMAL LOW (ref 4.0–10.5)
nRBC: 0 % (ref 0.0–0.2)

## 2021-09-03 LAB — GLUCOSE, CAPILLARY
Glucose-Capillary: 96 mg/dL (ref 70–99)
Glucose-Capillary: 111 mg/dL — ABNORMAL HIGH (ref 70–99)
Glucose-Capillary: 100 mg/dL — ABNORMAL HIGH (ref 70–99)
Glucose-Capillary: 113 mg/dL — ABNORMAL HIGH (ref 70–99)

## 2021-09-03 LAB — MAGNESIUM: Magnesium: 1.7 mg/dL (ref 1.7–2.4)

## 2021-09-03 MED ORDER — METOPROLOL TARTRATE 25 MG PO TABS
25.0000 mg | ORAL_TABLET | Freq: Two times a day (BID) | ORAL | Status: DC
  Administered 2021-09-03 – 2021-09-05 (×5): 25 mg via ORAL
  Filled 2021-09-03 (×5): qty 1

## 2021-09-03 MED ORDER — POTASSIUM CHLORIDE CRYS ER 20 MEQ PO TBCR
40.0000 meq | EXTENDED_RELEASE_TABLET | Freq: Three times a day (TID) | ORAL | Status: AC
  Administered 2021-09-03 (×3): 40 meq via ORAL
  Filled 2021-09-03 (×3): qty 2

## 2021-09-03 MED ORDER — OXYCODONE HCL 5 MG PO TABS
5.0000 mg | ORAL_TABLET | Freq: Four times a day (QID) | ORAL | Status: AC | PRN
  Administered 2021-09-04 – 2021-09-05 (×3): 5 mg via ORAL
  Filled 2021-09-03 (×3): qty 1

## 2021-09-03 MED ORDER — MORPHINE SULFATE (PF) 2 MG/ML IV SOLN
2.0000 mg | INTRAVENOUS | Status: AC | PRN
  Administered 2021-09-03 (×3): 2 mg via INTRAVENOUS
  Filled 2021-09-03 (×3): qty 1

## 2021-09-03 MED ORDER — SENNOSIDES-DOCUSATE SODIUM 8.6-50 MG PO TABS
2.0000 | ORAL_TABLET | Freq: Every day | ORAL | Status: DC
  Administered 2021-09-03 – 2021-09-05 (×3): 2 via ORAL
  Filled 2021-09-03 (×2): qty 2

## 2021-09-03 MED ORDER — AMLODIPINE BESYLATE 5 MG PO TABS
5.0000 mg | ORAL_TABLET | Freq: Every day | ORAL | Status: DC
  Administered 2021-09-03 – 2021-09-05 (×3): 5 mg via ORAL
  Filled 2021-09-03 (×3): qty 1

## 2021-09-03 MED ORDER — MORPHINE SULFATE (PF) 2 MG/ML IV SOLN: 2.0000 mg | INTRAVENOUS | Status: DC | PRN

## 2021-09-03 NOTE — Progress Notes (Signed)
PT Cancellation Note  Patient Details Name: [name] MRN: [id number] DOB: 1961-07-14   Cancelled Treatment:    Reason Eval/Treat Not Completed: Pain limiting ability to participate (pt reports 10/10 back pain, doesn't feel he can tolerate activity at present. Pain medication requested. Pt does not want to attempt mobility after getting pain medication as he was up with OT earlier and is hurting a lot from that. Will follow.)  Tamala Ser PT 09/03/2021  Acute Rehabilitation Services Pager [phone] Office [phone]

## 2021-09-03 NOTE — Progress Notes (Signed)
Consult called by [facility] for chronic diabetic foot wound with osteo. Patient has seen Dr. Victorino Dike previously and declined BKA. Dr. Laverta Baltimore team to see him tomorrow and determine plan.

## 2021-09-03 NOTE — Consult Note (Signed)
[facility]    Date of Admission:  09/01/2021     Reason for Consult: chronic diabetic foot infection    Referring Provider: Dow Adolph      Lines:  Peripheral IV  Abx: 6 weeks ertapenem by mid august, chronic doxy/augmentin since mid august Which are continued this admission       Assessment: Chronic diabetic foot infection His prosthetic joints (bilateral knees/hips) Dm2 Alcoholism -- currently here for etoh withdrawal  HFpEF Obese/osa Lymphedema  He is at this time admitted for alcohol withdrawal. Primary team ask me to comment on his chronic abx regimen/daibetic foot infection  I reviewed his admission ulcer picture. Overall he said it is smaller in size  Patient had refused bka in the past during 04/2021 admission He has basically not resolved his problem with prolonged almost 4 month antibiotics. It is proven that most cases of diabetic foot either respond to 4-6 weeks abx or doesn't including surgical/nonsurgical cases.   He has abi u/s 8/22 that showed normal triphasic wave form and index  Would stop abx at this time Continue wound care When he developed acute pyogenic complication from the diabetic foot, will place back on short course of abx at that time  Discussed with him he is at high risk for pyogenic complication and definitive tx would be bka   Plan: Stop doxy/augmentin Please have either ortho/podiatry revisit case with patient. I don't see at this point further abx will do anything for the foot wound. I suspect he needs definitive bka. There was a sinus tract mentioned during 04/2021 which might need to be resected I advised him to make an appointment with Dr Daiva Eves as needed in the next 2-4 weeks if there is concerning changes in his foot suggesting of local infection (increased redness/pain/swelling/purulence/blistering or fever/chill) ID will sign off  Discussed with primary  team     ------------------------------------------------ Principal Problem:   Alcohol dependence with withdrawal (HCC) Active Problems:   OSA (obstructive sleep apnea)   Hypothyroidism   Chronic diastolic CHF (congestive heart failure) (HCC)   Diabetic foot ulcer (HCC)   Type 2 diabetes mellitus with foot ulcer (HCC)   Hypokalemia   Hypomagnesemia   Thrombocytopenia (HCC)   Elevated LFTs    HPI: [name] is a 60 y.o. male alcoholic, obese, osa, dm2, HFpEF, chronic left heel ulcer, hx bilateral hip/knee prosthetic joints, admitted 11/15 for alcohol withdrawal, ID asked to comment on his previous diabetic foot infection/abx regimen   His last drink was 2 days prior to admission; he is 4 days from it now without fever/chill/delirium tremon  I reviewed with him and reviewed the chart with regard to prior id consult/progress note for his left heel ulcer/diabetic foot infection  He initially was evaluated 04/2021 by our id team; mri at that time showed left sinus tract extending to 5th MT base from the plantar ulcer and 5th MT base marow edema; there was also tibiotalar joint effusion and synovial enhancement. He refused orthopedics advice for amputation/bka. There was no I&D at that time. He underwent 6 weeks empiric ertapenem and continued on doxy/augmentin by mid 05/2021 for left foot heel ulcer  He continues on doxy/augmentin and has been having daily hyperbaric wound care. He said his ulcer is getting smaller  Denies fever/chill  He does have peripheral neruopathy  Wound care is seeing him this admission  He has relative leukopenia this admission which appears chronic  stable; no fever Bcx was obtained 11/17 today by primary team not entirely clear for what reason  Family History  Adopted: Yes  Family history unknown: Yes    Social History   Tobacco Use   Smoking status: Former    Packs/day: 0.25    Years: 10.00    Pack years: 2.50    Types: Cigarettes    Smokeless tobacco: Never   Tobacco comments:    quit 2018  Vaping Use   Vaping Use: Never used  Substance Use Topics   Alcohol use: Not Currently    Comment: 2-3 drinks 4-5 nights a week, liquor   Drug use: Yes    Frequency: 1.0 times per week    Types: Marijuana    Comment: "4 times a month, maybe"    Allergies  Allergen Reactions   Claritin [Loratadine] Shortness Of Breath and Anxiety    Review of Systems: ROS All Other ROS was negative, except mentioned above   Past Medical History:  Diagnosis Date   Alcohol dependence (HCC)    Arthritis    hips, hands   Bilateral carpal tunnel syndrome    Bilateral leg edema    Chronic   CHF (congestive heart failure) (HCC)    Diabetes mellitus without complication (HCC)    type 2   Diverticulitis    portion of colon removed   DOE (dyspnea on exertion)    occ   Elevated liver enzymes    Fatty liver    GERD (gastroesophageal reflux disease)    occ   Hammer toe    Hip pain    History of ventral hernia repair 2016   x2   Hyperlipidemia    pt unsure   Hypertension    Marijuana abuse    Morbid obesity (HCC)    Neuromuscular disorder (HCC)    peripheral neuropathy feet and few fingers   OSA (obstructive sleep apnea)    has OSA-not used CPAP 2-3 yrs could not tolerate cpap   PONV (postoperative nausea and vomiting)    Toe ulcer (HCC)    left healed       Scheduled Meds:  amLODipine  5 mg Oral Daily   amoxicillin-clavulanate  1 tablet Oral BID   Chlorhexidine Gluconate Cloth  6 each Topical Daily   doxycycline  100 mg Oral BID   enoxaparin (LOVENOX) injection  40 mg Subcutaneous Q24H   folic acid  1 mg Oral Daily   furosemide  80 mg Oral BID   gabapentin  300 mg Oral BID   hydrOXYzine  25 mg Oral BID   insulin aspart  0-9 Units Subcutaneous TID WC   levothyroxine  50 mcg Oral Q0600   mouth rinse  15 mL Mouth Rinse BID   metoprolol tartrate  25 mg Oral BID   multivitamin with minerals  1 tablet Oral Daily    potassium chloride  40 mEq Oral TID   senna-docusate  2 tablet Oral Q0600   sodium chloride flush  3 mL Intravenous Q12H   thiamine  100 mg Oral Daily   Or   thiamine  100 mg Intravenous Daily   Continuous Infusions: PRN Meds:.acetaminophen **OR** acetaminophen, hydrALAZINE, LORazepam **OR** LORazepam, morphine injection, ondansetron **OR** ondansetron (ZOFRAN) IV, oxyCODONE   OBJECTIVE: Blood pressure (!) 161/87, pulse 88, temperature 98.4 F (36.9 C), resp. rate (!) 30, height 6\' 1"  (1.854 m), weight 123 kg, SpO2 100 %.  Physical Exam  General/constitutional: no distress, pleasant HEENT: Normocephalic, PER, Conj Clear,  EOMI, Oropharynx clear Neck supple CV: rrr no mrg Lungs: clear to auscultation, normal respiratory effort Abd: Soft, Nontender Ext: bilateral chronic skin changes and brawny edema Skin: No Rash Neuro: nonfocal MSK: see picture below; left foot bulky dressing  11/15 admission foot picture   Central line presence: no   Lab Results Lab Results  Component Value Date   WBC 3.4 (L) 09/03/2021   HGB 12.0 (L) 09/03/2021   HCT 35.9 (L) 09/03/2021   MCV 90.0 09/03/2021   PLT 89 (L) 09/03/2021    Lab Results  Component Value Date   CREATININE 0.86 09/03/2021   BUN 14 09/03/2021   NA 135 09/03/2021   K 2.9 (L) 09/03/2021   CL 98 09/03/2021   CO2 26 09/03/2021    Lab Results  Component Value Date   ALT 29 09/03/2021   AST 45 (H) 09/03/2021   ALKPHOS 122 09/03/2021   BILITOT 1.9 (H) 09/03/2021      Microbiology: Recent Results (from the past 240 hour(s))  Resp Panel by RT-PCR (Flu A&B, Covid) Nasopharyngeal Swab     Status: None   Collection Time: 09/01/21 10:15 PM   Specimen: Nasopharyngeal Swab; Nasopharyngeal(NP) swabs in vial transport medium  Result Value Ref Range Status   SARS Coronavirus 2 by RT PCR NEGATIVE NEGATIVE Final    Comment: (NOTE) SARS-CoV-2 target nucleic acids are NOT DETECTED.  The SARS-CoV-2 RNA is generally detectable  in upper respiratory specimens during the acute phase of infection. The lowest concentration of SARS-CoV-2 viral copies this assay can detect is 138 copies/mL. A negative result does not preclude SARS-Cov-2 infection and should not be used as the sole basis for treatment or other patient management decisions. A negative result may occur with  improper specimen collection/handling, submission of specimen other than nasopharyngeal swab, presence of viral mutation(s) within the areas targeted by this assay, and inadequate number of viral copies(<138 copies/mL). A negative result must be combined with clinical observations, patient history, and epidemiological information. The expected result is Negative.  Fact Sheet for Patients:  BloggerCourse.com  Fact Sheet for Healthcare Providers:  SeriousBroker.it  This test is no t yet approved or cleared by the [place] FDA and  has been authorized for detection and/or diagnosis of SARS-CoV-2 by FDA under an Emergency Use Authorization (EUA). This EUA will remain  in effect (meaning this test can be used) for the duration of the COVID-19 declaration under Section 564(b)(1) of the Act, 21 U.S.C.section 360bbb-3(b)(1), unless the authorization is terminated  or revoked sooner.       Influenza A by PCR NEGATIVE NEGATIVE Final   Influenza B by PCR NEGATIVE NEGATIVE Final    Comment: (NOTE) The Xpert Xpress SARS-CoV-2/FLU/RSV plus assay is intended as an aid in the diagnosis of influenza from Nasopharyngeal swab specimens and should not be used as a sole basis for treatment. Nasal washings and aspirates are unacceptable for Xpert Xpress SARS-CoV-2/FLU/RSV testing.  Fact Sheet for Patients: BloggerCourse.com  Fact Sheet for Healthcare Providers: SeriousBroker.it  This test is not yet approved or cleared by the [place] FDA and has  been authorized for detection and/or diagnosis of SARS-CoV-2 by FDA under an Emergency Use Authorization (EUA). This EUA will remain in effect (meaning this test can be used) for the duration of the COVID-19 declaration under Section 564(b)(1) of the Act, 21 U.S.C. section 360bbb-3(b)(1), unless the authorization is terminated or revoked.  Performed at [facility], [address], [place], [place] [address]  MRSA Next Gen by PCR, Nasal     Status: None   Collection Time: 09/02/21  5:05 PM   Specimen: Nasal Mucosa; Nasal Swab  Result Value Ref Range Status   MRSA by PCR Next Gen NOT DETECTED NOT DETECTED Final    Comment: (NOTE) The GeneXpert MRSA Assay (FDA approved for NASAL specimens only), is one component of a comprehensive MRSA colonization surveillance program. It is not intended to diagnose MRSA infection nor to guide or monitor treatment for MRSA infections. Test performance is not FDA approved in patients less than 51 years old. Performed at [facility], [address], [place], [place] [address]      Serology:    Imaging: If present, new imagings (plain films, ct scans, and mri) have been personally visualized and interpreted; radiology reports have been reviewed. Decision making incorporated into the Impression / Recommendations.    Raymondo Band, MD [facility] [phone] pager    09/03/2021, 11:00 AM

## 2021-09-03 NOTE — Evaluation (Signed)
Occupational Therapy Evaluation Patient Details Name: [name] MRN: [id number] DOB: January 25, 1961 Today's Date: 09/03/2021   History of Present Illness [name] is a 60 y.o. male with medical history significant for alcohol use disorder, chronic diastolic CHF (Last EF 60-65%), T2DM, chronic diabetic foot ulcers on chronic antibiotic, HTN, hypothyroidism, depression, chronic back pain, obesity, OSA not using CPAP who presented to the ED for management of alcohol withdrawal.   Clinical Impression   [name] is a 60 year old man who presents with above medical history and diagnosis. On evaluation patient presents with generalized weakness, decreased activity tolerance, impaired balance, tremors, supposed NWB status of LLE and complaints of pain. Patient requiring assistance to stand and limited to [employer] then recliner transfer due to not able to adhere to NWB status of foot due to wound. Patient reports using DME at home with limited mobility due to foot wound and chronic back pain. He has a wheelchair as well. Patient requiring assistance for LB dressing and toileting which he can typically do at modified independence. Patient will benefit from skilled OT services while in hospital to improve deficits and learn compensatory strategies as needed in order to return to PLOF. Patient reports his girlfriend is home most of the time and can assist him at discharge. Recommend HH OT at discharge.      Recommendations for follow up therapy are one component of a multi-disciplinary discharge planning process, led by the attending physician.  Recommendations may be updated based on patient status, additional functional criteria and insurance authorization.   Follow Up Recommendations  Home health OT    Assistance Recommended at Discharge Intermittent Supervision/Assistance  Functional Status Assessment  Patient has had a recent decline in their functional status and demonstrates the ability to make  significant improvements in function in a reasonable and predictable amount of time.  Equipment Recommendations  None recommended by OT    Recommendations for Other Services       Precautions / Restrictions Precautions Precautions: Fall Restrictions Weight Bearing Restrictions: Yes LLE Weight Bearing: Non weight bearing Other Position/Activity Restrictions: per patient - supposed to be NWB      Mobility Bed Mobility Overal bed mobility: Needs Assistance Bed Mobility: Supine to Sit     Supine to sit: [facility] elevated;Min assist          Transfers Overall transfer level: Needs assistance Equipment used: Rolling walker (2 wheels) Transfers: Sit to/from Stand;Bed to chair/wheelchair/BSC Sit to Stand: Mod assist;Min assist Stand pivot transfers: Min assist         General transfer comment: Min assis to stand from elevated surface and mod assist from BSC/reclner height. Min assist for steadying with walker (standard only one available).      Balance Overall balance assessment: Needs assistance Sitting-balance support: Feet supported;No upper extremity supported Sitting balance-Leahy Scale: Fair     Standing balance support: During functional activity Standing balance-Leahy Scale: Poor Standing balance comment: reliant on upper extremity support                           ADL either performed or assessed with clinical judgement   ADL Overall ADL's : Needs assistance/impaired Eating/Feeding: Set up;Sitting   Grooming: Set up;Sitting   Upper Body Bathing: Set up;Sitting   Lower Body Bathing: Moderate assistance;Sit to/from stand   Upper Body Dressing : Set up;Sitting   Lower Body Dressing: Sit to/from stand;Total assistance   Toilet Transfer: Moderate assistance;BSC/3in1 Toilet  Transfer Details (indicate cue type and reason): mod to rise from [facility] Toileting- Clothing Manipulation and Hygiene: Total assistance;Sit to/from stand Toileting - Clothing  Manipulation Details (indicate cue type and reason): total asist for hygiene and clothing management     Functional mobility during ADLs: Moderate assistance;Rolling walker (2 wheels) General ADL Comments: Mod assist for sit to stand and min assist for steadying with taking steps. Reliant on walker limitng use of upper extremities for ADLs.     Vision Patient Visual Report: No change from baseline       Perception     Praxis      Pertinent Vitals/Pain Pain Assessment: 0-10 Pain Score: 8  Pain Location: low back Pain Descriptors / Indicators: Aching Pain Intervention(s): Limited activity within patient's tolerance;Monitored during session     Hand Dominance Right   Extremity/Trunk Assessment Upper Extremity Assessment Upper Extremity Assessment: RUE deficits/detail;LUE deficits/detail RUE Deficits / Details: WFL ROM, 4/5 strength grossly RUE Sensation: WNL RUE Coordination:  (tremors) LUE Deficits / Details: WFL ROM, 4/5 strength grossly LUE Sensation: WNL LUE Coordination:  (tremors)   Lower Extremity Assessment Lower Extremity Assessment: Defer to PT evaluation   Cervical / Trunk Assessment Cervical / Trunk Assessment: Kyphotic   Communication Communication Communication: No difficulties   Cognition Arousal/Alertness: Awake/alert Behavior During Therapy: WFL for tasks assessed/performed Overall Cognitive Status: Within Functional Limits for tasks assessed                                       General Comments       Exercises     Shoulder Instructions      Home Living Family/patient expects to be discharged to:: Private residence Living Arrangements: Spouse/significant other Available Help at Discharge: Family;Available PRN/intermittently (girlfriend is there predominantly - but may work out of home 2-3 days a week) Type of Home: House Home Access: Stairs to enter [employer] of Steps: 3 Entrance Stairs-Rails: Right;Left Home  Layout: Two level;Able to live on main level with bedroom/bathroom   Alternate Level Stairs-Rails: Right Bathroom Shower/Tub: Producer, television/film/video: Standard Bathroom Accessibility: Yes How Accessible: Accessible via walker Home Equipment: Rolling Walker (2 wheels);Rollator (4 wheels);BSC/3in1;Shower seat;Wheelchair - manual;Toilet riser          Prior Functioning/Environment Prior Level of Function : Independent/Modified Independent             Mobility Comments: uses walker/rollator/wheelchair as needed. is supposed to be NWB on LLE ADLs Comments: from seated position or propped on sink, dresses in bed or sitting at edge        OT Problem List: Decreased strength;Decreased activity tolerance;Impaired balance (sitting and/or standing);Decreased knowledge of use of DME or AE;Pain;Obesity      OT Treatment/Interventions: Self-care/ADL training;Therapeutic exercise;DME and/or AE instruction;Therapeutic activities;Balance training;Patient/family education    OT Goals(Current goals can be found in the care plan section) Acute Rehab OT Goals Patient Stated Goal: have less back pain so he can do more OT Goal Formulation: With patient Time For Goal Achievement: 09/24/21 Potential to Achieve Goals: Good  OT Frequency: Min 2X/week   Barriers to D/C:            Co-evaluation              AM-PAC OT "6 Clicks" Daily Activity     Outcome Measure Help from another person eating meals?: A Little Help from another person taking  care of personal grooming?: A Little Help from another person toileting, which includes using toliet, bedpan, or urinal?: Total Help from another person bathing (including washing, rinsing, drying)?: A Lot Help from another person to put on and taking off regular upper body clothing?: A Lot Help from another person to put on and taking off regular lower body clothing?: Total 6 Click Score: 12   End of Session Equipment Utilized During  Treatment: Standard Office manager Communication: Mobility status  Activity Tolerance: Patient tolerated treatment well Patient left: in chair;with call bell/phone within reach;with nursing/sitter in room  OT Visit Diagnosis: Unsteadiness on feet (R26.81);Pain;Muscle weakness (generalized) (M62.81)                Time: 1000-1034 OT Time Calculation (min): 34 min Charges:  OT General Charges $OT Visit: 1 Visit OT Evaluation $OT Eval Low Complexity: 1 Low OT Treatments $Self Care/Home Management : 8-22 mins  Jetaun Colbath, OTR/L Acute Care Rehab Services  Office [phone] Pager: [phone]   Kelli Churn 09/03/2021, 11:33 AM

## 2021-09-03 NOTE — Progress Notes (Signed)
PROGRESS NOTE  [name] RFF:[id number] DOB: Mar 08, 1961 DOA: 09/01/2021 PCP: [name], MD  HPI/Recap of past 24 hours: [name] is a 60 y.o. male with medical history significant for alcohol use disorder, chronic diastolic CHF (Last EF 60-65%), T2DM, diabetic foot ulcers, left foot osteomyelitis followed by infectious disease outpatient on chronic antibiotics, HTN, hypothyroidism, chronic depression/anxiety, chronic back pain, obesity, OSA not using CPAP who presented to [facility] ED for management of alcohol withdrawal, associated with tremors and impaired balance.  2 falls for the past 2 weeks, including on the day of admission.  Also presented with a chronically infected left foot wound for which he follows with wound clinic and ID, he is on Augmentin and doxycycline chronically.  He is also being followed by orthopedic surgery in [facility], he has hoped to avoid amputation.  Wound care specialist consulted and provided instruction on how to care for the wound.  Local wound care in place.  Infectious disease consulted.  09/03/2021: Seen at his bedside.  Reports lower back pain.  He has no pain in his left foot due to neuropathy.  Some jitteriness noted at bedside.  He is on CIWA protocol  Assessment/Plan: Principal Problem:   Alcohol dependence with withdrawal (HCC) Active Problems:   OSA (obstructive sleep apnea)   Hypothyroidism   Chronic diastolic CHF (congestive heart failure) (HCC)   Diabetic foot ulcer (HCC)   Type 2 diabetes mellitus with foot ulcer (HCC)   Hypokalemia   Hypomagnesemia   Thrombocytopenia (HCC)   Elevated LFTs  Alcohol use disorder with acute withdrawal. Has been tachycardic and hypertensive while in the ED, likely related to alcohol withdrawal. Last alcohol intake was on 08/30/2021. Still in the window for alcohol withdrawal. Continue CIWA protocol Continue multivitamins, thiamine and folic acid supplement. Patient is committed to sobriety and to  maintain it. Continue to closely monitor  Essential hypertension, BP is not at goal, elevated BP still not at goal, elevated Increase dose of Lopressor to 25 mg twice daily Norvasc 5 milligrams daily added. Continue to treat underlying conditions, Continue to closely monitor vital signs  Sinus tachycardia, suspect related to alcohol withdrawal Last TSH 0.787 on 04/28/21 Continue to treat alcohol withdrawal.  Acute hypoxic respiratory failure, unclear etiology Not on oxygen supplementation at baseline Currently on 3 L to maintain a saturation greater than 92% Chest x-ray, personally reviewed, nonrevealing, no evidence of pulmonary edema, pneumonia, pneumothorax or pleural effusion.  Wean off oxygen supplementation as tolerated.  Chronic left foot wound/osteomyelitis, follows with wound clinic and ID He is on Augmentin and doxycycline chronically Continue local wound care as recommended by wound care specialist. MRSA screen negative. Blood cultures obtained to rule out disseminated infection since no current plan for surgery. Infectious disease consulted to assist with the management Patient follows with orthopedic surgery in [facility].   Patient has been reluctant to have an amputation.  Type 2 diabetes with hyperglycemia Hemoglobin A1c 6.2 on 09/02/2021 Continue insulin sliding scale Continue to avoid hypoglycemia.  Diabetic polyneuropathy Continue home gabapentin Continue fall precautions  Refractory hypokalemia Serum potassium 2.7> 2.9 despite replacement. Repleted orally. Repeat BMP in the morning.  Resolved post repletion: Hypomagnesemia Serum magnesium 1.6> 1.7.  Leukopenia/thrombocytopenia suspect secondary to acute illness WBC uptrending 3.4 from 3.3. Platelet count downtrending 89 from 97. Continue to monitor.  Ambulatory dysfunction with recurrent falls PT OT assessment Seen by OT with recommendation for home health OT. Continue fall  precautions  Critical care time: 40  minutes.   Code Status: Full code  Family Communication: Wife at bedside  Disposition Plan: Likely will discharge to home once hemodynamically stable.   Consultants: None  Procedures: None  Antimicrobials: Augmentin Doxycycline  DVT prophylaxis: Subcu heparin 3 times daily, will change to subcu Lovenox daily.  Status is: Inpatient status.  Patient will require least 2 midnights for further evaluation and treatment of present condition.         Objective: Vitals:   09/03/21 0300 09/03/21 0400 09/03/21 0500 09/03/21 0800  BP: (!) 160/77 (!) 168/77 (!) 161/87   Pulse: 80 90 88   Resp: 19 (!) 26 (!) 30   Temp:  98.1 F (36.7 C)  98.4 F (36.9 C)  TempSrc:  Oral    SpO2: 100% 100% 100%   Weight:      Height:        Intake/Output Summary (Last 24 hours) at 09/03/2021 1207 Last data filed at 09/02/2021 1700 Gross per 24 hour  Intake 45.32 ml  Output 1000 ml  Net -954.68 ml   Filed Weights   09/02/21 0743  Weight: 123 kg    Exam:  General: 60 y.o. year-old male well-developed well-nourished in no acute stress.  He is alert and oriented x3.   Cardiovascular: Regular rate and rhythm no rubs or gallops.  No JVD autonomically noted.   Respiratory: Clear to auscultation with no wheezes or rales.  Good inspiratory effort.   Abdomen: Soft nontender normal bowel sounds present.  Musculoskeletal: Trace lower extremity edema.   Skin:   Psychiatry: Mood is appropriate for condition and setting. Neuro: Alert and awake.  Moves all 4 extremities.  Mild jitteriness likely from alcohol withdrawal.   Data Reviewed: CBC: Recent Labs  Lab 09/01/21 2039 09/02/21 0339 09/03/21 0659  WBC 4.5 3.3* 3.4*  NEUTROABS 3.6  --   --   HGB 12.7* 12.5* 12.0*  HCT 37.8* 37.4* 35.9*  MCV 87.7 88.6 90.0  PLT 103* 97* 89*   Basic Metabolic Panel: Recent Labs  Lab 09/01/21 2039 09/02/21 0339 09/02/21 1245 09/03/21 0659  NA 135 137  137 135  K 3.3* 2.8* 2.7* 2.9*  CL 96* 98 99 98  CO2 27 27 26 26   GLUCOSE 112* 85 126* 94  BUN 15 13 13 14   CREATININE 0.89 0.74 0.81 0.86  CALCIUM 8.8* 8.6* 8.7* 8.5*  MG 1.3* 1.7 1.6* 1.7   GFR: Estimated Creatinine Clearance: 125.5 mL/min (by C-G formula based on SCr of 0.86 mg/dL). Liver Function Tests: Recent Labs  Lab 09/01/21 2039 09/02/21 0339 09/03/21 0659  AST 72* 58* 45*  ALT 37 33 29  ALKPHOS 144* 126 122  BILITOT 2.1* 2.1* 1.9*  PROT 8.3* 7.5 7.3  ALBUMIN 3.7 3.4* 3.4*   No results for input(s): LIPASE, AMYLASE in the last 168 hours. No results for input(s): AMMONIA in the last 168 hours. Coagulation Profile: Recent Labs  Lab 09/02/21 0339  INR 1.2   Cardiac Enzymes: No results for input(s): CKTOTAL, CKMB, CKMBINDEX, TROPONINI in the last 168 hours. BNP (last 3 results) Recent Labs    11/24/20 1343  PROBNP 26.0   HbA1C: Recent Labs    09/02/21 0339  HGBA1C 6.2*   CBG: Recent Labs  Lab 09/02/21 1159 09/02/21 1734 09/02/21 2105 09/03/21 0747 09/03/21 1121  GLUCAP 138* 114* 109* 96 111*   Lipid Profile: No results for input(s): CHOL, HDL, LDLCALC, TRIG, CHOLHDL, LDLDIRECT in the last 72 hours. Thyroid Function Tests: No results for input(s):  TSH, T4TOTAL, FREET4, T3FREE, THYROIDAB in the last 72 hours. Anemia Panel: No results for input(s): VITAMINB12, FOLATE, FERRITIN, TIBC, IRON, RETICCTPCT in the last 72 hours. Urine analysis:    Component Value Date/Time   COLORURINE AMBER (A) 04/28/2021 0230   APPEARANCEUR CLEAR 04/28/2021 0230   LABSPEC 1.012 04/28/2021 0230   PHURINE 6.0 04/28/2021 0230   GLUCOSEU NEGATIVE 04/28/2021 0230   HGBUR SMALL (A) 04/28/2021 0230   BILIRUBINUR NEGATIVE 04/28/2021 0230   KETONESUR NEGATIVE 04/28/2021 0230   PROTEINUR NEGATIVE 04/28/2021 0230   NITRITE NEGATIVE 04/28/2021 0230   LEUKOCYTESUR NEGATIVE 04/28/2021 0230   Sepsis Labs: @LABRCNTIP (procalcitonin:4,lacticidven:4)  ) Recent Results (from  the past 240 hour(s))  Resp Panel by RT-PCR (Flu A&B, Covid) Nasopharyngeal Swab     Status: None   Collection Time: 09/01/21 10:15 PM   Specimen: Nasopharyngeal Swab; Nasopharyngeal(NP) swabs in vial transport medium  Result Value Ref Range Status   SARS Coronavirus 2 by RT PCR NEGATIVE NEGATIVE Final    Comment: (NOTE) SARS-CoV-2 target nucleic acids are NOT DETECTED.  The SARS-CoV-2 RNA is generally detectable in upper respiratory specimens during the acute phase of infection. The lowest concentration of SARS-CoV-2 viral copies this assay can detect is 138 copies/mL. A negative result does not preclude SARS-Cov-2 infection and should not be used as the sole basis for treatment or other patient management decisions. A negative result may occur with  improper specimen collection/handling, submission of specimen other than nasopharyngeal swab, presence of viral mutation(s) within the areas targeted by this assay, and inadequate number of viral copies(<138 copies/mL). A negative result must be combined with clinical observations, patient history, and epidemiological information. The expected result is Negative.  Fact Sheet for Patients:  09/03/21  Fact Sheet for Healthcare Providers:  BloggerCourse.com  This test is no t yet approved or cleared by the [employer]ious[employer].[place] FDA and  has been authorized for detection and/or diagnosis of SARS-CoV-2 by FDA under an Emergency Use Authorization (EUA). This EUA will remain  in effect (meaning this test can be used) for the duration of the COVID-19 declaration under Section 564(b)(1) of the Act, 21 U.S.C.section 360bbb-3(b)(1), unless the authorization is terminated  or revoked sooner.       Influenza A by PCR NEGATIVE NEGATIVE Final   Influenza B by PCR NEGATIVE NEGATIVE Final    Comment: (NOTE) The Xpert Xpress SARS-CoV-2/FLU/RSV plus assay is intended as an aid in the diagnosis of  influenza from Nasopharyngeal swab specimens and should not be used as a sole basis for treatment. Nasal washings and aspirates are unacceptable for Xpert Xpress SARS-CoV-2/FLU/RSV testing.  Fact Sheet for Patients: [place]  Fact Sheet for Healthcare Providers: BloggerCourse.com  This test is not yet approved or cleared by the [employer]ious[employer]ker.it FDA and has been authorized for detection and/or diagnosis of SARS-CoV-2 by FDA under an Emergency Use Authorization (EUA). This EUA will remain in effect (meaning this test can be used) for the duration of the COVID-19 declaration under Section 564(b)(1) of the Act, 21 U.S.C. section 360bbb-3(b)(1), unless the authorization is terminated or revoked.  Performed at [facility], [address], [place], [place]   MRSA Next Gen by PCR, Nasal     Status: None   Collection Time: 09/02/21  5:05 PM   Specimen: Nasal Mucosa; Nasal Swab  Result Value Ref Range Status   MRSA by PCR Next Gen NOT DETECTED NOT DETECTED Final    Comment: (NOTE) The GeneXpert MRSA Assay (FDA approved  for NASAL specimens only), is one component of a comprehensive MRSA colonization surveillance program. It is not intended to diagnose MRSA infection nor to guide or monitor treatment for MRSA infections. Test performance is not FDA approved in patients less than 16 years old. Performed at [facility], [address] [place], [place] [address]       Studies: DG CHEST PORT 1 VIEW  Result Date: 09/02/2021 CLINICAL DATA:  Hypoxia. EXAM: PORTABLE CHEST 1 VIEW COMPARISON:  Chest radiograph 04/28/2021 FINDINGS: Stable upper normal heart size, likely accentuated by technique. Unchanged mediastinal contours with aortic atherosclerosis. No pulmonary edema, focal airspace disease, pleural effusion or pneumothorax. No acute osseous abnormalities are seen. IMPRESSION: No acute  abnormality. Electronically Signed   By: Narda Rutherford M.D.   On: 09/02/2021 15:21    Scheduled Meds:  amLODipine  5 mg Oral Daily   amoxicillin-clavulanate  1 tablet Oral BID   Chlorhexidine Gluconate Cloth  6 each Topical Daily   doxycycline  100 mg Oral BID   enoxaparin (LOVENOX) injection  40 mg Subcutaneous Q24H   folic acid  1 mg Oral Daily   furosemide  80 mg Oral BID   gabapentin  300 mg Oral BID   hydrOXYzine  25 mg Oral BID   insulin aspart  0-9 Units Subcutaneous TID WC   levothyroxine  50 mcg Oral Q0600   mouth rinse  15 mL Mouth Rinse BID   metoprolol tartrate  25 mg Oral BID   multivitamin with minerals  1 tablet Oral Daily   potassium chloride  40 mEq Oral TID   senna-docusate  2 tablet Oral Q0600   sodium chloride flush  3 mL Intravenous Q12H   thiamine  100 mg Oral Daily   Or   thiamine  100 mg Intravenous Daily    Continuous Infusions:     LOS: 1 day     Darlin Drop, MD [facility] Pager [phone]  If 7PM-7AM, please contact night-coverage [email or web address] Password [facility] 09/03/2021, 12:07 PM

## 2021-09-04 ENCOUNTER — Ambulatory Visit: Payer: Medicare Other | Admitting: Cardiovascular Disease

## 2021-09-04 DIAGNOSIS — F10230 Alcohol dependence with withdrawal, uncomplicated: Secondary | ICD-10-CM | POA: Diagnosis not present

## 2021-09-04 LAB — CBC
HCT: 37.7 % — ABNORMAL LOW (ref 39.0–52.0)
Hemoglobin: 12 g/dL — ABNORMAL LOW (ref 13.0–17.0)
MCH: 29.4 pg (ref 26.0–34.0)
MCHC: 31.8 g/dL (ref 30.0–36.0)
MCV: 92.4 fL (ref 80.0–100.0)
Platelets: 96 10*3/uL — ABNORMAL LOW (ref 150–400)
RBC: 4.08 MIL/uL — ABNORMAL LOW (ref 4.22–5.81)
RDW: 15.2 % (ref 11.5–15.5)
WBC: 4 10*3/uL (ref 4.0–10.5)
nRBC: 0 % (ref 0.0–0.2)

## 2021-09-04 LAB — GLUCOSE, CAPILLARY
Glucose-Capillary: 86 mg/dL (ref 70–99)
Glucose-Capillary: 106 mg/dL — ABNORMAL HIGH (ref 70–99)
Glucose-Capillary: 105 mg/dL — ABNORMAL HIGH (ref 70–99)
Glucose-Capillary: 96 mg/dL (ref 70–99)

## 2021-09-04 LAB — COMPREHENSIVE METABOLIC PANEL
ALT: 28 U/L (ref 0–44)
AST: 44 U/L — ABNORMAL HIGH (ref 15–41)
Albumin: 3.3 g/dL — ABNORMAL LOW (ref 3.5–5.0)
Alkaline Phosphatase: 134 U/L — ABNORMAL HIGH (ref 38–126)
Anion gap: 14 (ref 5–15)
BUN: 15 mg/dL (ref 6–20)
CO2: 24 mmol/L (ref 22–32)
Calcium: 9.1 mg/dL (ref 8.9–10.3)
Chloride: 100 mmol/L (ref 98–111)
Creatinine, Ser: 0.89 mg/dL (ref 0.61–1.24)
GFR, Estimated: >60 mL/min (ref >60)
Glucose, Bld: 94 mg/dL (ref 70–99)
Potassium: 3.3 mmol/L — ABNORMAL LOW (ref 3.5–5.1)
Sodium: 138 mmol/L (ref 135–145)
Total Bilirubin: 1.9 mg/dL — ABNORMAL HIGH (ref 0.3–1.2)
Total Protein: 7.2 g/dL (ref 6.5–8.1)

## 2021-09-04 MED ORDER — POTASSIUM CHLORIDE CRYS ER 20 MEQ PO TBCR
20.0000 meq | EXTENDED_RELEASE_TABLET | Freq: Two times a day (BID) | ORAL | Status: AC
  Administered 2021-09-04 (×2): 20 meq via ORAL
  Filled 2021-09-04 (×2): qty 1

## 2021-09-04 MED ORDER — MORPHINE SULFATE (PF) 2 MG/ML IV SOLN
2.0000 mg | INTRAVENOUS | Status: AC | PRN
  Administered 2021-09-04 (×3): 2 mg via INTRAVENOUS
  Filled 2021-09-04 (×3): qty 1

## 2021-09-04 NOTE — TOC Progression Note (Signed)
Transition of Care [facility]) - Progression Note   Patient Details  Name: Santiago Stenzel MRN: [id number] Date of Birth: 01/31/1961  [facility] CM/SW Contact  Ewing Schlein, LCSW Phone Number: 09/04/2021, 1:39 PM  Clinical Narrative: PT and OT have evaluated the patient and are recommending HH. CSW spoke with patient and he is agreeable to referral. CSW made [facility] referral to [facility] with [facility]. Orders placed for HH (PT, OT, RN). TOC to follow.  Expected Discharge Plan: Home w Home Health Services Barriers to Discharge: Continued Medical Work up  Expected Discharge Plan and Services Expected Discharge Plan: Home w Home Health Services In-house Referral: Clinical Social Work Post Acute Care Choice:  (TBD) Living arrangements for the past 2 months: Single Family Home HH Arranged: RN, PT, OT [facility] Agency: [facility] Date HH Agency Contacted: 09/04/21 Time HH Agency Contacted: [phone] Representative spoke with at [facility] Agency: [place]  Readmission Risk Interventions Readmission Risk Prevention Plan 03/04/2020  Transportation Screening Complete  PCP or Specialist Appt within 3-5 Days Complete  HRI or Home Care Consult Complete  Social Work Consult for Recovery Care Planning/Counseling Complete  Palliative Care Screening Complete  Medication Review [employer]) Complete  Some recent data might be hidden

## 2021-09-04 NOTE — Progress Notes (Signed)
Notified provider of 8/10 pain. Awaiting new orders. Dressing change done.

## 2021-09-04 NOTE — Progress Notes (Signed)
PROGRESS NOTE  [name] ZOX:[id number] DOB: 04-Sep-1961 DOA: 09/01/2021 PCP: [name], MD  HPI/Recap of past 24 hours: [name] is a 60 y.o. male with medical history significant for alcohol use disorder, chronic diastolic CHF (Last EF 60-65%), T2DM, diabetic foot ulcers, left foot osteomyelitis followed by infectious disease outpatient on chronic antibiotics, HTN, hypothyroidism, chronic depression/anxiety, chronic back pain, obesity, OSA not using CPAP who presented to [facility] ED for management of alcohol withdrawal, associated with tremors and impaired balance.  2 falls for the past 2 weeks, including on the day of admission.  Also presented with a chronically infected left foot wound for which he follows with wound clinic and ID, he is on Augmentin and doxycycline chronically.  He is also being followed by orthopedic surgery in [facility], he has hoped to avoid amputation.  Wound care specialist consulted and provided instruction on how to care for the wound.  Local wound care in place.  Infectious disease consulted.  09/04/2021: Seen at bedside.  Reports lower back pain which is chronic.  Required dose of IV morphine.  No acute abnormality seen on admission xray.  If stable today will likely discharge tomorrow with home health services PT/OT/RN.  Assessment/Plan: Principal Problem:   Alcohol dependence with withdrawal (HCC) Active Problems:   OSA (obstructive sleep apnea)   Hypothyroidism   Chronic diastolic CHF (congestive heart failure) (HCC)   Diabetic foot ulcer (HCC)   Type 2 diabetes mellitus with foot ulcer (HCC)   Hypokalemia   Hypomagnesemia   Thrombocytopenia (HCC)   Elevated LFTs  Alcohol use disorder with acute withdrawal. Has been tachycardic and hypertensive while in the ED, likely related to alcohol withdrawal. Last alcohol intake was on 08/30/2021. Still in the window for alcohol withdrawal. Continue CIWA protocol Continue multivitamins, thiamine and  folic acid supplement. Patient is committed to sobriety and to maintain it. Continue to closely monitor  Chronic left foot wound/osteomyelitis, follows with wound clinic and ID He is on Augmentin and doxycycline chronically Continue local wound care as recommended by wound care specialist. MRSA screen negative. Blood cultures obtained to rule out disseminated infection since no current plan for surgery. Infectious disease consulted to assist with the management Patient follows with orthopedic surgery in [facility].   Patient has been reluctant to have an amputation which has been recommended by ID and orthopedic surgery. Will need to follow-up with his outpatient orthopedic surgeon in [place], [place].  Elevated liver chemistries, suspect secondary to chronic alcohol use Alkaline phosphatase 134 AST 44 T bilirubin 1.9 Continue alcohol cessation.  Chronic back pain with flare Lumbar spine xray non acute Will need to follow up with his outpatient provider PT recommended to continue PT OT with home health services Continue fall precautions Continue analgesics  Essential hypertension, BP is not at goal, elevated BP still not at goal, elevated Increase dose of Lopressor to 25 mg twice daily Norvasc 5 milligrams daily added. Continue to treat underlying conditions, Continue to closely monitor vital signs  Sinus tachycardia, suspect related to alcohol withdrawal Last TSH 0.787 on 04/28/21 Continue to treat alcohol withdrawal.  Acute hypoxic respiratory failure, unclear etiology Not on oxygen supplementation at baseline Currently on 3 L to maintain a saturation greater than 92% Chest x-ray, personally reviewed, nonrevealing, no evidence of pulmonary edema, pneumonia, pneumothorax or pleural effusion.  Wean off oxygen supplementation as tolerated.  Type 2 diabetes with hyperglycemia Hemoglobin A1c 6.2 on 09/02/2021 Continue insulin sliding scale Continue to avoid  hypoglycemia.  Diabetic polyneuropathy Continue home gabapentin Continue fall precautions  Refractory post repletion hypokalemia Serum potassium 2.7> 2.9> 3.3 despite replacement KCl 40 mEq given  Resolved post repletion: Hypomagnesemia Serum magnesium 1.6> 1.7.  Leukopenia/thrombocytopenia suspect secondary to acute illness WBC uptrending 3.4 from 3.3. Platelet count downtrending 89 from 97. Continue to monitor.  Ambulatory dysfunction with recurrent falls/physical debility PT OT assessment completed Seen by OT with recommendation for home health OT. Continue fall precautions  Critical care time: 40 minutes.   Code Status: Full code  Family Communication: Wife at bedside  Disposition Plan: Likely will discharge to home once hemodynamically stable.   Consultants: Infectious disease Curb sided with orthopedic surgery.  Procedures: None  Antimicrobials: Augmentin Doxycycline  DVT prophylaxis: Subcu heparin 3 times daily, will change to subcu Lovenox daily.  Status is: Inpatient status.  Patient will require least 2 midnights for further evaluation and treatment of present condition.         Objective: Vitals:   09/04/21 0600 09/04/21 0700 09/04/21 0745 09/04/21 1200  BP: (!) 200/104 (!) 143/127 (!) 161/77 (!) 142/82  Pulse: 91 86 95 96  Resp: (!) 22 (!) 28 (!) 21 (!) 22  Temp:   (!) 97.3 F (36.3 C)   TempSrc:   Axillary   SpO2: 100% 92% 95% 97%  Weight:      Height:        Intake/Output Summary (Last 24 hours) at 09/04/2021 1529 Last data filed at 09/04/2021 1200 Gross per 24 hour  Intake 250 ml  Output 1800 ml  Net -1550 ml   Filed Weights   09/02/21 0743  Weight: 123 kg    Exam:  General: 60 y.o. year-old male well-developed well-nourished in no acute distress.  He is alert and oriented x3.   Cardiovascular: Regular rate and rhythm no rubs or gallops.  Respiratory: Clear to auscultation with no wheezes or rales. Abdomen: Soft  Nontender Normal Bowel Sounds Present.  Musculoskeletal: Trace lower extremity edema. Skin:   Psychiatry: Mood is appropriate for condition and setting. Neuro: Alert and awake.    Data Reviewed: CBC: Recent Labs  Lab 09/01/21 2039 09/02/21 0339 09/03/21 0659 09/04/21 0241  WBC 4.5 3.3* 3.4* 4.0  NEUTROABS 3.6  --   --   --   HGB 12.7* 12.5* 12.0* 12.0*  HCT 37.8* 37.4* 35.9* 37.7*  MCV 87.7 88.6 90.0 92.4  PLT 103* 97* 89* 96*   Basic Metabolic Panel: Recent Labs  Lab 09/01/21 2039 09/02/21 0339 09/02/21 1245 09/03/21 0659 09/04/21 0241  NA 135 137 137 135 138  K 3.3* 2.8* 2.7* 2.9* 3.3*  CL 96* 98 99 98 100  CO2 27 27 26 26 24   GLUCOSE 112* 85 126* 94 94  BUN 15 13 13 14 15   CREATININE 0.89 0.74 0.81 0.86 0.89  CALCIUM 8.8* 8.6* 8.7* 8.5* 9.1  MG 1.3* 1.7 1.6* 1.7  --    GFR: Estimated Creatinine Clearance: 121.2 mL/min (by C-G formula based on SCr of 0.89 mg/dL). Liver Function Tests: Recent Labs  Lab 09/01/21 2039 09/02/21 0339 09/03/21 0659 09/04/21 0241  AST 72* 58* 45* 44*  ALT 37 33 29 28  ALKPHOS 144* 126 122 134*  BILITOT 2.1* 2.1* 1.9* 1.9*  PROT 8.3* 7.5 7.3 7.2  ALBUMIN 3.7 3.4* 3.4* 3.3*   No results for input(s): LIPASE, AMYLASE in the last 168 hours. No results for input(s): AMMONIA in the last 168 hours. Coagulation Profile: Recent Labs  Lab 09/02/21 [phone]  INR 1.2   Cardiac Enzymes: No results for input(s): CKTOTAL, CKMB, CKMBINDEX, TROPONINI in the last 168 hours. BNP (last 3 results) Recent Labs    11/24/20 1343  PROBNP 26.0   HbA1C: Recent Labs    09/02/21 0339  HGBA1C 6.2*   CBG: Recent Labs  Lab 09/03/21 1121 09/03/21 1738 09/03/21 2140 09/04/21 0746 09/04/21 1241  GLUCAP 111* 100* 113* 86 106*   Lipid Profile: No results for input(s): CHOL, HDL, LDLCALC, TRIG, CHOLHDL, LDLDIRECT in the last 72 hours. Thyroid Function Tests: No results for input(s): TSH, T4TOTAL, FREET4, T3FREE, THYROIDAB in the last 72  hours. Anemia Panel: No results for input(s): VITAMINB12, FOLATE, FERRITIN, TIBC, IRON, RETICCTPCT in the last 72 hours. Urine analysis:    Component Value Date/Time   COLORURINE AMBER (A) 04/28/2021 0230   APPEARANCEUR CLEAR 04/28/2021 0230   LABSPEC 1.012 04/28/2021 0230   PHURINE 6.0 04/28/2021 0230   GLUCOSEU NEGATIVE 04/28/2021 0230   HGBUR SMALL (A) 04/28/2021 0230   BILIRUBINUR NEGATIVE 04/28/2021 0230   KETONESUR NEGATIVE 04/28/2021 0230   PROTEINUR NEGATIVE 04/28/2021 0230   NITRITE NEGATIVE 04/28/2021 0230   LEUKOCYTESUR NEGATIVE 04/28/2021 0230   Sepsis Labs: @LABRCNTIP (procalcitonin:4,lacticidven:4)  ) Recent Results (from the past 240 hour(s))  Resp Panel by RT-PCR (Flu A&B, Covid) Nasopharyngeal Swab     Status: None   Collection Time: 09/01/21 10:15 PM   Specimen: Nasopharyngeal Swab; Nasopharyngeal(NP) swabs in vial transport medium  Result Value Ref Range Status   SARS Coronavirus 2 by RT PCR NEGATIVE NEGATIVE Final    Comment: (NOTE) SARS-CoV-2 target nucleic acids are NOT DETECTED.  The SARS-CoV-2 RNA is generally detectable in upper respiratory specimens during the acute phase of infection. The lowest concentration of SARS-CoV-2 viral copies this assay can detect is 138 copies/mL. A negative result does not preclude SARS-Cov-2 infection and should not be used as the sole basis for treatment or other patient management decisions. A negative result may occur with  improper specimen collection/handling, submission of specimen other than nasopharyngeal swab, presence of viral mutation(s) within the areas targeted by this assay, and inadequate number of viral copies(<138 copies/mL). A negative result must be combined with clinical observations, patient history, and epidemiological information. The expected result is Negative.  Fact Sheet for Patients:  BloggerCourse.com  Fact Sheet for Healthcare Providers:   SeriousBroker.it  This test is no t yet approved or cleared by the [place] FDA and  has been authorized for detection and/or diagnosis of SARS-CoV-2 by FDA under an Emergency Use Authorization (EUA). This EUA will remain  in effect (meaning this test can be used) for the duration of the COVID-19 declaration under Section 564(b)(1) of the Act, 21 U.S.C.section 360bbb-3(b)(1), unless the authorization is terminated  or revoked sooner.       Influenza A by PCR NEGATIVE NEGATIVE Final   Influenza B by PCR NEGATIVE NEGATIVE Final    Comment: (NOTE) The Xpert Xpress SARS-CoV-2/FLU/RSV plus assay is intended as an aid in the diagnosis of influenza from Nasopharyngeal swab specimens and should not be used as a sole basis for treatment. Nasal washings and aspirates are unacceptable for Xpert Xpress SARS-CoV-2/FLU/RSV testing.  Fact Sheet for Patients: BloggerCourse.com  Fact Sheet for Healthcare Providers: SeriousBroker.it  This test is not yet approved or cleared by the [place] FDA and has been authorized for detection and/or diagnosis of SARS-CoV-2 by FDA under an Emergency Use Authorization (EUA). This EUA will remain in effect (meaning this test can be used)  for the duration of the COVID-19 declaration under Section 564(b)(1) of the Act, 21 U.S.C. section 360bbb-3(b)(1), unless the authorization is terminated or revoked.  Performed at [facility], [address] [place], [place] [address]   MRSA Next Gen by PCR, Nasal     Status: None   Collection Time: 09/02/21  5:05 PM   Specimen: Nasal Mucosa; Nasal Swab  Result Value Ref Range Status   MRSA by PCR Next Gen NOT DETECTED NOT DETECTED Final    Comment: (NOTE) The GeneXpert MRSA Assay (FDA approved for NASAL specimens only), is one component of a comprehensive MRSA colonization surveillance program. It is not intended  to diagnose MRSA infection nor to guide or monitor treatment for MRSA infections. Test performance is not FDA approved in patients less than 66 years old. Performed at [facility], [address], [place], [place] [address]   Culture, blood (routine x 2)     Status: None (Preliminary result)   Collection Time: 09/03/21  9:13 AM   Specimen: BLOOD RIGHT HAND  Result Value Ref Range Status   Specimen Description   Final    BLOOD RIGHT HAND Performed at [facility], [address], [place], [place] [address]    Special Requests   Final    BOTTLES DRAWN AEROBIC AND ANAEROBIC Blood Culture adequate volume Performed at [facility], [address], [place], [place] [address]    Culture   Final    NO GROWTH 1 DAY Performed at [facility], [address], [place], [place] [address]    Report Status PENDING  Incomplete  Culture, blood (routine x 2)     Status: None (Preliminary result)   Collection Time: 09/03/21  9:17 AM   Specimen: BLOOD LEFT HAND  Result Value Ref Range Status   Specimen Description   Final    BLOOD LEFT HAND Performed at [facility], [address] [place], [place] [address]    Special Requests   Final    BOTTLES DRAWN AEROBIC ONLY Blood Culture adequate volume Performed at [facility], [address], [place], [place] [address]    Culture   Final    NO GROWTH 1 DAY Performed at [facility], [address], [place], [place] [address]    Report Status PENDING  Incomplete      Studies: No results found.  Scheduled Meds:  amLODipine  5 mg Oral Daily   Chlorhexidine Gluconate Cloth  6 each Topical Daily   enoxaparin (LOVENOX) injection  40 mg Subcutaneous Q24H   folic acid  1 mg Oral Daily   furosemide  80 mg Oral BID   gabapentin  300 mg Oral BID   hydrOXYzine  25 mg Oral BID   insulin aspart  0-9 Units Subcutaneous TID WC   levothyroxine  50  mcg Oral Q0600   mouth rinse  15 mL Mouth Rinse BID   metoprolol tartrate  25 mg Oral BID   multivitamin with minerals  1 tablet Oral Daily   potassium chloride  20 mEq Oral BID   senna-docusate  2 tablet Oral Q0600   sodium chloride flush  3 mL Intravenous Q12H   thiamine  100 mg Oral Daily   Or   thiamine  100 mg Intravenous Daily    Continuous Infusions:     LOS: 2 days     Darlin Drop, MD [facility] Pager [phone]  If 7PM-7AM, please contact night-coverage [email or web address] Password [facility] 09/04/2021, 3:29 PM

## 2021-09-04 NOTE — Evaluation (Signed)
Physical Therapy Evaluation Patient Details Name: [name] MRN: [id number] DOB: [birth year] Today's Date: 09/04/2021  History of Present Illness  [name] is a 60 y.o. male with medical history significant for alcohol use disorder, chronic diastolic CHF (Last EF 60-65%), T2DM, chronic diabetic foot ulcers on chronic antibiotic, HTN, hypothyroidism, depression, chronic back pain, obesity, OSA not using CPAP who presented to the ED for management of alcohol withdrawal.  Clinical Impression  Pt admitted with above diagnosis. Pt ambulated 30' with RW, with poor adherence to NWB LLE (pt stated he attempts to keep weight near toes and off of wound). Distance limited by back pain. Pt currently with functional limitations due to the deficits listed below (see PT Problem List). Pt will benefit from skilled PT to increase their independence and safety with mobility to allow discharge to the venue listed below.          Recommendations for follow up therapy are one component of a multi-disciplinary discharge planning process, led by the attending physician.  Recommendations may be updated based on patient status, additional functional criteria and insurance authorization.  Follow Up Recommendations Home health PT    Assistance Recommended at Discharge Intermittent Supervision/Assistance  Functional Status Assessment Patient has had a recent decline in their functional status and demonstrates the ability to make significant improvements in function in a reasonable and predictable amount of time.  Equipment Recommendations  None recommended by PT    Recommendations for Other Services       Precautions / Restrictions Precautions Precautions: Fall Precaution Comments: reports 2 falls in past 6 months Restrictions Other Position/Activity Restrictions: per patient - supposed to be NWB but hasn't been compliant with that at home PTA      Mobility  Bed Mobility Overal bed mobility: Modified  Independent Bed Mobility: Supine to Sit     Supine to sit: HOB elevated;Modified independent (Device/Increase time)     General bed mobility comments: used bed rail    Transfers Overall transfer level: Needs assistance Equipment used: Rolling walker (2 wheels) Transfers: Sit to/from Stand Sit to Stand: From elevated surface;Min assist           General transfer comment: min A to power up from elevated bed, VCs to WB on RLE (pt did not adhere to NWB status for LLE)    Ambulation/Gait Ambulation/Gait assistance: Min guard Gait Distance (Feet): 30 Feet Assistive device: Rolling walker (2 wheels) Gait Pattern/deviations: Decreased stride length;Step-to pattern Gait velocity: decr     General Gait Details: slow cadence, pt tremulous (noted going through EtOH withdrawal), vital signs stable, distance limited by fatigue and back pain. Poor adherence to NWB status LLE (pt stated he bears weight through the front of his foot to keep pressure off of the wound).   Stairs            Wheelchair Mobility    Modified Rankin (Stroke Patients Only)       Balance Overall balance assessment: Needs assistance Sitting-balance support: Feet supported;No upper extremity supported Sitting balance-Leahy Scale: Fair     Standing balance support: During functional activity Standing balance-Leahy Scale: Poor Standing balance comment: reliant on upper extremity support                             Pertinent Vitals/Pain Pain Score: 6  Pain Location: low back Pain Descriptors / Indicators: Aching Pain Intervention(s): Limited activity within patient's tolerance;Monitored during session;Premedicated before session;Other (comment) (donned  back brace (pt had this with him))    Home Living Family/patient expects to be discharged to:: Private residence Living Arrangements: Spouse/significant other Available Help at Discharge: Family;Available PRN/intermittently (girlfriend  is there predominantly - but may work out of home 2-3 days a week) Type of Home: House Home Access: Stairs to enter Entrance Stairs-Rails: Doctor, general practice of Steps: 3   Home Layout: Two level;Able to live on main level with bedroom/bathroom Home Equipment: Rolling Walker (2 wheels);Rollator (4 wheels);BSC/3in1;Shower seat;Wheelchair - manual;Toilet riser Additional Comments: lives with girlfriend who goes out of town at times    Prior Function Prior Level of Function : Independent/Modified Independent             Mobility Comments: uses walker/rollator/wheelchair as needed. is supposed to be NWB on LLE; was ambulating short household distances with RW PTA ADLs Comments: from seated position or propped on sink, dresses in bed or sitting at edge     Hand Dominance   Dominant Hand: Right    Extremity/Trunk Assessment   Upper Extremity Assessment Upper Extremity Assessment: Defer to OT evaluation    Lower Extremity Assessment Lower Extremity Assessment: RLE deficits/detail;LLE deficits/detail RLE Deficits / Details: knee ext 4/5; sensation absent to light touch at foot RLE Sensation: decreased light touch;history of peripheral neuropathy LLE Deficits / Details: knee ext -4/5, sensation absent to light touch at foot LLE Sensation: decreased light touch;history of peripheral neuropathy    Cervical / Trunk Assessment Cervical / Trunk Assessment: Kyphotic  Communication   Communication: No difficulties  Cognition Arousal/Alertness: Awake/alert Behavior During Therapy: WFL for tasks assessed/performed Overall Cognitive Status: Within Functional Limits for tasks assessed                                          General Comments      Exercises     Assessment/Plan    PT Assessment Patient needs continued PT services  PT Problem List Decreased activity tolerance;Impaired sensation;Decreased balance;Decreased mobility;Decreased  strength;Pain       PT Treatment Interventions Therapeutic activities;Therapeutic exercise;Gait training;Functional mobility training;Patient/family education    PT Goals (Current goals can be found in the Care Plan section)  Acute Rehab PT Goals Patient Stated Goal: golf PT Goal Formulation: With patient Time For Goal Achievement: 09/18/21 Potential to Achieve Goals: Fair    Frequency Min 3X/week   Barriers to discharge        Co-evaluation               AM-PAC PT "6 Clicks" Mobility  Outcome Measure Help needed turning from your back to your side while in a flat bed without using bedrails?: A Little Help needed moving from lying on your back to sitting on the side of a flat bed without using bedrails?: A Little Help needed moving to and from a bed to a chair (including a wheelchair)?: A Little Help needed standing up from a chair using your arms (e.g., wheelchair or bedside chair)?: A Little Help needed to walk in hospital room?: A Little Help needed climbing 3-5 steps with a railing? : A Lot 6 Click Score: 17    End of Session Equipment Utilized During Treatment: Gait belt Activity Tolerance: Patient limited by fatigue;Patient limited by pain Patient left: in bed;with call bell/phone within reach;with nursing/sitter in room (pt sitting at edge of bed) Nurse Communication: Mobility status PT Visit Diagnosis: Difficulty  in walking, not elsewhere classified (R26.2);Pain;History of falling (Z91.81) Pain - part of body:  (back)    Time: 2706-2376 PT Time Calculation (min) (ACUTE ONLY): 27 min   Charges:   PT Evaluation $PT Eval Moderate Complexity: 1 Mod PT Treatments $Gait Training: 8-22 mins       Ralene Bathe Kistler PT 09/04/2021  Acute Rehabilitation Services Pager [phone] Office [phone]

## 2021-09-05 DIAGNOSIS — F10230 Alcohol dependence with withdrawal, uncomplicated: Secondary | ICD-10-CM | POA: Diagnosis not present

## 2021-09-05 LAB — COMPREHENSIVE METABOLIC PANEL
ALT: 32 U/L (ref 0–44)
AST: 50 U/L — ABNORMAL HIGH (ref 15–41)
Albumin: 3.6 g/dL (ref 3.5–5.0)
Alkaline Phosphatase: 142 U/L — ABNORMAL HIGH (ref 38–126)
Anion gap: 12 (ref 5–15)
BUN: 16 mg/dL (ref 6–20)
CO2: 24 mmol/L (ref 22–32)
Calcium: 9.2 mg/dL (ref 8.9–10.3)
Chloride: 100 mmol/L (ref 98–111)
Creatinine, Ser: 0.86 mg/dL (ref 0.61–1.24)
GFR, Estimated: >60 mL/min (ref >60)
Glucose, Bld: 95 mg/dL (ref 70–99)
Potassium: 3.4 mmol/L — ABNORMAL LOW (ref 3.5–5.1)
Sodium: 136 mmol/L (ref 135–145)
Total Bilirubin: 2.1 mg/dL — ABNORMAL HIGH (ref 0.3–1.2)
Total Protein: 7.5 g/dL (ref 6.5–8.1)

## 2021-09-05 LAB — GLUCOSE, CAPILLARY: Glucose-Capillary: 84 mg/dL (ref 70–99)

## 2021-09-05 MED ORDER — SACCHAROMYCES BOULARDII 250 MG PO CAPS
250.0000 mg | ORAL_CAPSULE | Freq: Two times a day (BID) | ORAL | Status: DC
  Administered 2021-09-05: 250 mg via ORAL
  Filled 2021-09-05: qty 1

## 2021-09-05 MED ORDER — POTASSIUM CHLORIDE CRYS ER 20 MEQ PO TBCR
40.0000 meq | EXTENDED_RELEASE_TABLET | ORAL | Status: AC
  Administered 2021-09-05: 40 meq via ORAL
  Filled 2021-09-05: qty 2

## 2021-09-05 MED ORDER — AMLODIPINE BESYLATE 5 MG PO TABS: 5.0000 mg | ORAL_TABLET | Freq: Every day | ORAL | 0 refills | Status: DC

## 2021-09-05 MED ORDER — METOPROLOL TARTRATE 25 MG PO TABS: 25.0000 mg | ORAL_TABLET | Freq: Two times a day (BID) | ORAL | 0 refills | Status: DC

## 2021-09-05 MED ORDER — DOXYCYCLINE HYCLATE 100 MG PO TABS
100.0000 mg | ORAL_TABLET | Freq: Two times a day (BID) | ORAL | Status: DC
  Administered 2021-09-05: 100 mg via ORAL
  Filled 2021-09-05: qty 1

## 2021-09-05 MED ORDER — THIAMINE HCL 100 MG PO TABS: 100.0000 mg | ORAL_TABLET | Freq: Every day | ORAL | 0 refills | Status: AC

## 2021-09-05 MED ORDER — SACCHAROMYCES BOULARDII 250 MG PO CAPS: 250.0000 mg | ORAL_CAPSULE | Freq: Two times a day (BID) | ORAL | 0 refills | Status: DC

## 2021-09-05 MED ORDER — SENNOSIDES-DOCUSATE SODIUM 8.6-50 MG PO TABS: 2.0000 | ORAL_TABLET | Freq: Every evening | ORAL | 0 refills | Status: AC | PRN

## 2021-09-05 MED ORDER — FOLIC ACID 1 MG PO TABS: 1.0000 mg | ORAL_TABLET | Freq: Every day | ORAL | 0 refills | Status: DC

## 2021-09-05 MED ORDER — ADULT MULTIVITAMIN W/MINERALS CH: 1.0000 | ORAL_TABLET | Freq: Every day | ORAL | 0 refills | Status: AC

## 2021-09-05 MED ORDER — POTASSIUM CHLORIDE CRYS ER 20 MEQ PO TBCR: 40.0000 meq | EXTENDED_RELEASE_TABLET | Freq: Once | ORAL | Status: DC

## 2021-09-05 MED ORDER — AMOXICILLIN-POT CLAVULANATE 875-125 MG PO TABS
1.0000 | ORAL_TABLET | Freq: Two times a day (BID) | ORAL | Status: DC
  Administered 2021-09-05: 1 via ORAL
  Filled 2021-09-05: qty 1

## 2021-09-05 MED ORDER — OXYCODONE HCL 5 MG PO TABS: 5.0000 mg | ORAL_TABLET | Freq: Two times a day (BID) | ORAL | 0 refills | Status: DC | PRN

## 2021-09-05 NOTE — Discharge Summary (Signed)
Discharge Summary  [name] ZOX:[id number] DOB: April 11, 1961  PCP: [name], MD  Admit date: 09/01/2021 Discharge date: 09/05/2021  Time spent: 35 minutes  Recommendations for Outpatient Follow-up:  Follow-up with your orthopedic surgeon Follow-up with wound care clinic Follow-up with infectious disease Follow-up with your primary care provider.  Discharge Diagnoses:  Active Hospital Problems   Diagnosis Date Noted   Alcohol dependence with withdrawal (HCC) 09/01/2021   Type 2 diabetes mellitus with foot ulcer (HCC) 09/02/2021   Hypokalemia 09/02/2021   Hypomagnesemia 09/02/2021   Thrombocytopenia (HCC) 09/02/2021   Elevated LFTs 09/02/2021   Diabetic foot ulcer (HCC) 04/28/2021   Chronic diastolic CHF (congestive heart failure) (HCC) 10/15/2019   Hypothyroidism 04/22/2019   OSA (obstructive sleep apnea) 11/23/2016    Resolved Hospital Problems  No resolved problems to display.    Discharge Condition: Stable  Diet recommendation: Heart healthy carb modified diet.  Vitals:   09/05/21 0520 09/05/21 0800  BP:  (!) 160/92  Pulse: 87 87  Resp: (!) 22 17  Temp:  97.6 F (36.4 C)  SpO2: 98% 100%    History of present illness:  Darren Caldron is a 60 y.o. male with medical history significant for alcohol use disorder, chronic diastolic CHF (Last EF 60-65%), T2DM, left foot osteomyelitis followed by infectious disease on chronic antibiotics, HTN, hypothyroidism, chronic depression/anxiety, chronic back pain, obesity, OSA not using CPAP who presented to [facility] ED for management of alcohol withdrawal, associated with tremors and impaired balance.  2 falls for the past 2 weeks, including on the day of admission.  Also presented with a chronically infected left foot wound for which he follows with wound care clinic, ID and orthopedic surgery.  He is on Augmentin and doxycycline chronically for at least 3 months.  Wound care specialist was consulted and provided instructions  on local wound care.  Seen by infectious disease who recommended amputation.  Curb sided with orthopedic surgery who also recommended amputation.  Patient is reluctant to have an amputation.  Discussed with the patient that he is at high risk for pyogenic complication.  Infectious disease also discussed with the patient that he is at high risk for pyogenic complication and definitive treatment would be below-knee amputation.  Patient advised to closely follow-up with his orthopedic surgeon in [facility], wound care specialist, and infectious disease provider Dr. Zenaida Niece dam.  Patient understands and agrees with the plan.  09/05/2021: Seen at his bedside.  Reports lower back pain which is chronic.  Lumbar spine 4+ view and thoracic spine 2 view x-rays done on 09/01/2021 were nonacute.  Patient advised to follow-up with his outpatient provider and to continue PT OT.  PT OT has been arranged by transition of care team.  Home health RN has also been arranged to assist with local wound care.  Hospital Course:  Principal Problem:   Alcohol dependence with withdrawal (HCC) Active Problems:   OSA (obstructive sleep apnea)   Hypothyroidism   Chronic diastolic CHF (congestive heart failure) (HCC)   Diabetic foot ulcer (HCC)   Type 2 diabetes mellitus with foot ulcer (HCC)   Hypokalemia   Hypomagnesemia   Thrombocytopenia (HCC)   Elevated LFTs  Alcohol use disorder with acute withdrawal, resolved. Presented with uncontrolled hypertension and tachycardia related to alcohol withdrawal. Last alcohol intake was on 08/30/2021. Completed CIWA protocol. Continue to completely abstain from alcohol Continue multivitamins, thiamine and folic acid supplement. Patient is committed to sobriety and to maintain it. Patient states he has  arrangements made outpatient to keep sobriety.   Chronic left foot wound/osteomyelitis, follows with wound clinic and ID He is on Augmentin and doxycycline  chronically-ID, Dr. Renold Don, recommended to stop antibiotics however the patient wants to continue to take his home antibiotics.  Patient advised to closely follow-up with infectious disease postdischarge. Continue local wound care as recommended by wound care specialist.   Keep close follow-up appointment with wound care clinic. Follow-up with your orthopedic surgeon in [facility]. MRSA screen negative. Blood cultures obtained to rule out disseminated infection since no current plan for surgery.  Blood cultures x2 no growth to date as of 09/05/2021.  Elevated liver chemistries, in the setting of chronic alcohol use Alkaline phosphatase 134 AST 44 T bilirubin 1.9 Continue alcohol cessation and avoid hepatotoxic agents. Follow-up with your primary care provider.   Chronic back pain with flare Lumbar spine xray non acute Follow up with his outpatient provider Continue PT OT with assistance and fall precautions. Analgesics as needed   Essential hypertension Continue oral antihypertensives Follow-up with your PCP   Resolved sinus tachycardia in the setting of alcohol withdrawal Last TSH 0.787 on 04/28/21   Resolved acute hypoxic respiratory failure No evidence of pneumonia or pulmonary edema on chest x-ray.   Type 2 diabetes with hyperglycemia Hemoglobin A1c 6.2 on 09/02/2021 Resume home regimen.  Diabetic polyneuropathy Continue home gabapentin Continue fall precautions   Refractory post repletion hypokalemia Serum potassium 2.7> 2.9> 3.3> 3.4 despite replacement KCl 40 mEq given Resume home regimen with potassium supplement.   Resolved post repletion: Hypomagnesemia Serum magnesium 1.6> 1.7.   Leukopenia, resolved/thrombocytopenia, improving, suspect secondary to acute illness WBC uptrending 4.0 from 3.4 from 3.3. Platelet count uptrending 96 from 89. Follow-up with your PCP.   Ambulatory dysfunction with recurrent falls/physical debility PT OT recommended  home health PT OT Continue PT OT with assistance and fall precautions     Code Status: Full code      Consultants: Infectious disease Curb sided with orthopedic surgery.   Procedures: None   Antimicrobials: Augmentin Doxycycline    Discharge Exam: BP (!) 160/92   Pulse 87   Temp 97.6 F (36.4 C) (Oral)   Resp 17   Ht  (1.854 m)   Wt 123 kg   SpO2 100%   BMI 35.78 kg/m  General: 60 y.o. year-old male well developed well nourished in no acute distress.  Alert and oriented x3. Cardiovascular: Regular rate and rhythm with no rubs or gallops.  No thyromegaly or JVD noted.   Respiratory: Clear to auscultation with no wheezes or rales. Good inspiratory effort. Abdomen: Soft nontender nondistended with normal bowel sounds x4 quadrants. Psychiatry: Mood is appropriate for condition and setting  Discharge Instructions You were cared for by a hospitalist during your hospital stay. If you have any questions about your discharge medications or the care you received while you were in the hospital after you are discharged, you can call the unit and asked to speak with the hospitalist on call if the hospitalist that took care of you is not available. Once you are discharged, your primary care physician will handle any further medical issues. Please note that NO REFILLS for any discharge medications will be authorized once you are discharged, as it is imperative that you return to your primary care physician (or establish a relationship with a primary care physician if you do not have one) for your aftercare needs so that they can reassess your need for medications and  monitor your lab values.   Allergies as of 09/05/2021       Reactions   Claritin [loratadine] Shortness Of Breath, Anxiety        Medication List     STOP taking these medications    docusate sodium 100 MG capsule Commonly known as: COLACE   DULoxetine 20 MG capsule Commonly known as: CYMBALTA    naltrexone 50 MG tablet Commonly known as: DEPADE   ondansetron 4 MG tablet Commonly known as: ZOFRAN       TAKE these medications    acamprosate 333 MG tablet Commonly known as: CAMPRAL Take 333 mg by mouth 2 (two) times daily.   amLODipine 5 MG tablet Commonly known as: NORVASC Take 1 tablet (5 mg total) by mouth daily. Start taking on: September 06, 2021   amoxicillin-clavulanate 875-125 MG tablet Commonly known as: Augmentin Take 1 tablet by mouth 2 (two) times daily.   doxycycline 100 MG tablet Commonly known as: VIBRA-TABS Take 1 tablet (100 mg total) by mouth 2 (two) times daily.   folic acid 1 MG tablet Commonly known as: FOLVITE Take 1 tablet (1 mg total) by mouth daily. Start taking on: September 06, 2021   furosemide 40 MG tablet Commonly known as: LASIX TAKE 1 TABLET BY MOUTH TWICE A DAY What changed:  how much to take how to take this when to take this additional instructions   gabapentin 100 MG capsule Commonly known as: NEURONTIN Take 1 capsule (100 mg total) by mouth 3 (three) times daily. What changed:  how much to take when to take this   HEMP OIL-VANILLYL BUTYL ETHER EX Apply 1 application topically 2 (two) times daily as needed (arthritis pain).   hydrOXYzine 25 MG tablet Commonly known as: ATARAX/VISTARIL Take 25 mg by mouth 2 (two) times daily.   levothyroxine 50 MCG tablet Commonly known as: SYNTHROID Take 50 mcg by mouth daily before breakfast.   LORazepam 1 MG tablet Commonly known as: ATIVAN Take 1 mg by mouth daily as needed for anxiety.   metoprolol tartrate 25 MG tablet Commonly known as: LOPRESSOR Take 1 tablet (25 mg total) by mouth 2 (two) times daily. What changed:  medication strength how much to take   multivitamin with minerals Tabs tablet Take 1 tablet by mouth daily. Start taking on: September 06, 2021   naproxen sodium 220 MG tablet Commonly known as: ALEVE Take 220 mg by mouth every 12 (twelve)  hours.   oxyCODONE 5 MG immediate release tablet Commonly known as: Roxicodone Take 1 tablet (5 mg total) by mouth 2 (two) times daily as needed for up to 3 days for severe pain.   potassium chloride SA 20 MEQ tablet Commonly known as: Klor-Con M20 Take 3 tablets (60 mEq total) by mouth 2 (two) times daily.   saccharomyces boulardii 250 MG capsule Commonly known as: FLORASTOR Take 1 capsule (250 mg total) by mouth 2 (two) times daily.   senna-docusate 8.6-50 MG tablet Commonly known as: Senokot-S Take 2 tablets by mouth at bedtime as needed for up to 10 days for mild constipation.   thiamine 100 MG tablet Take 1 tablet (100 mg total) by mouth daily. Start taking on: September 06, 2021   traZODone 50 MG tablet Commonly known as: DESYREL TAKE 1-2 TABLETS (50-100 MG TOTAL) BY MOUTH AT BEDTIME AS NEEDED FOR SLEEP. What changed:  when to take this additional instructions   vitamin B-12 100 MCG tablet Commonly known as: CYANOCOBALAMIN Take 100 mcg by  mouth daily.       Allergies  Allergen Reactions   Claritin [Loratadine] Shortness Of Breath and Anxiety    Follow-up Information     [facility] Follow [facility].   Specialty: Home Health Services Why: PT, OT, RN                 The results of significant diagnostics from this hospitalization (including imaging, microbiology, ancillary and laboratory) are listed below for reference.    Significant Diagnostic Studies: DG Thoracic Spine 2 View  Result Date: 09/01/2021 CLINICAL DATA:  Fall EXAM: THORACIC SPINE 2 VIEWS COMPARISON:  None. FINDINGS: Diffuse degenerative changes throughout the thoracic spine. No fracture or focal bone lesion. IMPRESSION: No acute bony abnormality. Electronically Signed   By: Charlett Nose M.D.   On: 09/01/2021 18:42   DG Lumbar Spine Complete  Result Date: 09/01/2021 CLINICAL DATA:  Chronic back pain EXAM: LUMBAR SPINE - COMPLETE 4+ VIEW COMPARISON:  None. FINDINGS:  Diffuse degenerative disc and facet disease. No fracture. Normal alignment. SI joints symmetric and unremarkable. Bilateral hip replacement partially imaged. Aortic atherosclerosis. No aneurysm. IMPRESSION: Degenerative changes.  No acute bony abnormality. Aortic atherosclerosis. Electronically Signed   By: Charlett Nose M.D.   On: 09/01/2021 18:43   CT HEAD WO CONTRAST ( )  Result Date: 09/01/2021 CLINICAL DATA:  History of remote fall with headaches, history of tremors, initial encounter EXAM: CT HEAD WITHOUT CONTRAST TECHNIQUE: Contiguous axial images were obtained from the base of the skull through the vertex without intravenous contrast. COMPARISON:  None. FINDINGS: Brain: Very mild volume loss is noted commenced with the patient's given age. No findings to suggest acute hemorrhage, acute infarction or space-occupying mass lesion are noted. Vascular: No hyperdense vessel or unexpected calcification. Skull: Normal. Negative for fracture or focal lesion. Sinuses/Orbits: No acute finding. Other: None. IMPRESSION: Minimal atrophic changes are noted. No acute intracranial abnormality is seen. Electronically Signed   By: Alcide Clever M.D.   On: 09/01/2021 20:15   DG CHEST PORT 1 VIEW  Result Date: 09/02/2021 CLINICAL DATA:  Hypoxia. EXAM: PORTABLE CHEST 1 VIEW COMPARISON:  Chest radiograph 04/28/2021 FINDINGS: Stable upper normal heart size, likely accentuated by technique. Unchanged mediastinal contours with aortic atherosclerosis. No pulmonary edema, focal airspace disease, pleural effusion or pneumothorax. No acute osseous abnormalities are seen. IMPRESSION: No acute abnormality. Electronically Signed   By: Narda Rutherford M.D.   On: 09/02/2021 15:21    Microbiology: Recent Results (from the past 240 hour(s))  Resp Panel by RT-PCR (Flu A&B, Covid) Nasopharyngeal Swab     Status: None   Collection Time: 09/01/21 10:15 PM   Specimen: Nasopharyngeal Swab; Nasopharyngeal(NP) swabs in vial transport  medium  Result Value Ref Range Status   SARS Coronavirus 2 by RT PCR NEGATIVE NEGATIVE Final    Comment: (NOTE) SARS-CoV-2 target nucleic acids are NOT DETECTED.  The SARS-CoV-2 RNA is generally detectable in upper respiratory specimens during the acute phase of infection. The lowest concentration of SARS-CoV-2 viral copies this assay can detect is 138 copies/mL. A negative result does not preclude SARS-Cov-2 infection and should not be used as the sole basis for treatment or other patient management decisions. A negative result may occur with  improper specimen collection/handling, submission of specimen other than nasopharyngeal swab, presence of viral mutation(s) within the areas targeted by this assay, and inadequate number of viral copies(<138 copies/mL). A negative result must be combined with clinical observations, patient history, and epidemiological information. The expected result  is Negative.  Fact Sheet for Patients:  BloggerCourse.com  Fact Sheet for Healthcare Providers:  SeriousBroker.it  This test is no t yet approved or cleared by the [place] FDA and  has been authorized for detection and/or diagnosis of SARS-CoV-2 by FDA under an Emergency Use Authorization (EUA). This EUA will remain  in effect (meaning this test can be used) for the duration of the COVID-19 declaration under Section 564(b)(1) of the Act, 21 U.S.C.section 360bbb-3(b)(1), unless the authorization is terminated  or revoked sooner.       Influenza A by PCR NEGATIVE NEGATIVE Final   Influenza B by PCR NEGATIVE NEGATIVE Final    Comment: (NOTE) The Xpert Xpress SARS-CoV-2/FLU/RSV plus assay is intended as an aid in the diagnosis of influenza from Nasopharyngeal swab specimens and should not be used as a sole basis for treatment. Nasal washings and aspirates are unacceptable for Xpert Xpress SARS-CoV-2/FLU/RSV testing.  Fact Sheet for  Patients: BloggerCourse.com  Fact Sheet for Healthcare Providers: SeriousBroker.it  This test is not yet approved or cleared by the [place] FDA and has been authorized for detection and/or diagnosis of SARS-CoV-2 by FDA under an Emergency Use Authorization (EUA). This EUA will remain in effect (meaning this test can be used) for the duration of the COVID-19 declaration under Section 564(b)(1) of the Act, 21 U.S.C. section 360bbb-3(b)(1), unless the authorization is terminated or revoked.  Performed at [facility], [address] [place], [place] [address]   MRSA Next Gen by PCR, Nasal     Status: None   Collection Time: 09/02/21  5:05 PM   Specimen: Nasal Mucosa; Nasal Swab  Result Value Ref Range Status   MRSA by PCR Next Gen NOT DETECTED NOT DETECTED Final    Comment: (NOTE) The GeneXpert MRSA Assay (FDA approved for NASAL specimens only), is one component of a comprehensive MRSA colonization surveillance program. It is not intended to diagnose MRSA infection nor to guide or monitor treatment for MRSA infections. Test performance is not FDA approved in patients less than 33 years old. Performed at [facility], [address] [place], [place] [address]   Culture, blood (routine x 2)     Status: None (Preliminary result)   Collection Time: 09/03/21  9:13 AM   Specimen: BLOOD RIGHT HAND  Result Value Ref Range Status   Specimen Description   Final    BLOOD RIGHT HAND Performed at [facility], [address] [place], [place] [address]    Special Requests   Final    BOTTLES DRAWN AEROBIC AND ANAEROBIC Blood Culture adequate volume Performed at [facility], [address], [place], [place] [address]    Culture   Final    NO GROWTH 1 DAY Performed at [facility], [address], [place], [place] [address]    Report Status PENDING   Incomplete  Culture, blood (routine x 2)     Status: None (Preliminary result)   Collection Time: 09/03/21  9:17 AM   Specimen: BLOOD LEFT HAND  Result Value Ref Range Status   Specimen Description   Final    BLOOD LEFT HAND Performed at [facility], [address], [place], [place] [address]    Special Requests   Final    BOTTLES DRAWN AEROBIC ONLY Blood Culture adequate volume Performed at [facility], [address] [place], [place] [address]    Culture   Final  NO GROWTH 1 DAY Performed at [facility], [address], [place], [place] [address]    Report Status PENDING  Incomplete     Labs: Basic Metabolic Panel: Recent Labs  Lab 09/01/21 2039 09/02/21 0339 09/02/21 1245 09/03/21 0659 09/04/21 0241 09/05/21 0615  NA 135 137 137 135 138 136  K 3.3* 2.8* 2.7* 2.9* 3.3* 3.4*  CL 96* 98 99 98 100 100  CO2 27 27 26 26 24 24   GLUCOSE 112* 85 126* 94 94 95  BUN 15 13 13 14 15 16   CREATININE 0.89 0.74 0.81 0.86 0.89 0.86  CALCIUM 8.8* 8.6* 8.7* 8.5* 9.1 9.2  MG 1.3* 1.7 1.6* 1.7  --   --    Liver Function Tests: Recent Labs  Lab 09/01/21 2039 09/02/21 0339 09/03/21 0659 09/04/21 0241 09/05/21 0615  AST 72* 58* 45* 44* 50*  ALT 37 33 29 28 32  ALKPHOS 144* 126 122 134* 142*  BILITOT 2.1* 2.1* 1.9* 1.9* 2.1*  PROT 8.3* 7.5 7.3 7.2 7.5  ALBUMIN 3.7 3.4* 3.4* 3.3* 3.6   No results for input(s): LIPASE, AMYLASE in the last 168 hours. No results for input(s): AMMONIA in the last 168 hours. CBC: Recent Labs  Lab 09/01/21 2039 09/02/21 0339 09/03/21 0659 09/04/21 0241  WBC 4.5 3.3* 3.4* 4.0  NEUTROABS 3.6  --   --   --   HGB 12.7* 12.5* 12.0* 12.0*  HCT 37.8* 37.4* 35.9* 37.7*  MCV 87.7 88.6 90.0 92.4  PLT 103* 97* 89* 96*   Cardiac Enzymes: No results for input(s): CKTOTAL, CKMB, CKMBINDEX, TROPONINI in the last 168 hours. BNP: BNP (last 3 results) No results for input(s): BNP in the last 8760  hours.  ProBNP (last 3 results) Recent Labs    11/24/20 1343  PROBNP 26.0    CBG: Recent Labs  Lab 09/04/21 0746 09/04/21 1241 09/04/21 1745 09/04/21 2144 09/05/21 0744  GLUCAP 86 106* 105* 96 84       Signed:  Darlin Drop, MD [facility] 09/05/2021, 4:21 PM

## 2021-09-06 LAB — VITAMIN B1: Vitamin B1 (Thiamine): 109.6 nmol/L (ref 66.5–200.0)

## 2021-09-08 LAB — CULTURE, BLOOD (ROUTINE X 2)
Culture: NO GROWTH
Culture: NO GROWTH
Special Requests: ADEQUATE
Special Requests: ADEQUATE

## 2021-09-09 ENCOUNTER — Encounter: Payer: Self-pay | Admitting: Internal Medicine

## 2021-09-09 ENCOUNTER — Ambulatory Visit (INDEPENDENT_AMBULATORY_CARE_PROVIDER_SITE_OTHER): Payer: Medicare Other | Admitting: Internal Medicine

## 2021-09-09 ENCOUNTER — Other Ambulatory Visit: Payer: Self-pay

## 2021-09-09 VITALS — BP 124/80 | HR 98 | Temp 98.4°F | Ht 73.0 in | Wt 278.0 lb

## 2021-09-09 DIAGNOSIS — I10 Essential (primary) hypertension: Secondary | ICD-10-CM

## 2021-09-09 DIAGNOSIS — G621 Alcoholic polyneuropathy: Secondary | ICD-10-CM

## 2021-09-09 DIAGNOSIS — M545 Low back pain, unspecified: Secondary | ICD-10-CM

## 2021-09-09 DIAGNOSIS — G8929 Other chronic pain: Secondary | ICD-10-CM | POA: Diagnosis not present

## 2021-09-09 DIAGNOSIS — R600 Localized edema: Secondary | ICD-10-CM | POA: Diagnosis not present

## 2021-09-09 DIAGNOSIS — L97502 Non-pressure chronic ulcer of other part of unspecified foot with fat layer exposed: Secondary | ICD-10-CM

## 2021-09-09 MED ORDER — GABAPENTIN 300 MG PO CAPS: 300.0000 mg | ORAL_CAPSULE | Freq: Two times a day (BID) | ORAL | 3 refills | Status: DC

## 2021-09-09 MED ORDER — MELOXICAM 15 MG PO TABS: 15.0000 mg | ORAL_TABLET | Freq: Every day | ORAL | 0 refills | Status: DC

## 2021-09-09 MED ORDER — OXYCODONE HCL 5 MG PO TABS
5.0000 mg | ORAL_TABLET | Freq: Three times a day (TID) | ORAL | 0 refills | Status: DC | PRN
Start: 2021-09-09 — End: 2021-09-16

## 2021-09-09 NOTE — Assessment & Plan Note (Signed)
Chronic Controlled with current dose of Lasix 40 mg twice daily-advised he can take the first dose in the morning and the second dose in the afternoon Can adjust Lasix dose based on swelling Elevate legs when sitting Continue to abstain from alcohol

## 2021-09-09 NOTE — Assessment & Plan Note (Signed)
Chronic Peripheral neuropathy is related to alcoholism Continue gabapentin 300 mg twice times daily-can adjust dose if needed

## 2021-09-09 NOTE — Assessment & Plan Note (Signed)
Chronic Blood pressure well controlled Continue amlodipine 5 mg daily, Lasix 40 mg twice daily, metoprolol 25 mg twice daily

## 2021-09-09 NOTE — Assessment & Plan Note (Signed)
Acute on chronic lower back pain without sciatica Pain flared after fall X-ray showed nothing acute, does have arthritis Pain is not controlled and this is inhibiting his recovery from his hospitalization Referral ordered for Ortho care I will prescribe short-term oxycodone 5 mg every 8 hours as needed-he is trying to abstain from alcohol and we do not want him to get hooked on any narcotics so he knows this will be short-term Will start meloxicam 15 mg daily-discussed potential side effects and ideally we do not want to continue this long-term either

## 2021-09-09 NOTE — Patient Instructions (Addendum)
    Medications changes include :   meloxicam 15 mg daily with food.  Oxycodone three times daily with food as needed.     Your prescription(s) have been submitted to your pharmacy. Please take as directed and contact our office if you believe you are having problem(s) with the medication(s).   A referral was ordered for [facility]      Someone from their office will call you to schedule an appointment.    Please followup in 3 months

## 2021-09-09 NOTE — Progress Notes (Signed)
Subjective:    Patient ID: [name], male    DOB: 1961/01/14, 60 y.o.   MRN: [id number]  This visit occurred during the SARS-CoV-2 public health emergency.  Safety protocols were in place, including screening questions prior to the visit, additional usage of staff PPE, and extensive cleaning of exam room while observing appropriate contact time as indicated for disinfecting solutions.     HPI The patient is here for follow up from the hospital.   Admitted 11/15-2011/19 for alcohol withdrawal.  He presented to the ED for alcohol withdrawal-he was having tremors and impaired balance.  He had had 2 falls in the past 2 weeks including the day of admission.  He has a chronically infected left foot wound and has been following with infectious disease and has been on antibiotics (Augmentin and doxycycline for at least 3 months).  Alcohol use disorder, acute withdrawal: Resolved Completed CIWA protocol He was advised to abstain from alcohol Was advised to continue multivitamin, thiamine and folic acid supplements  Chronic left foot wound/osteomyelitis Chronic MRSA negative Blood cultures negative Has been following with wound clinic and ID as an outpatient ID recommended stopping antibiotics, but he wanted to continue taking the antibiotics Will follow-up with ID as an outpatient Will continue to follow with wound clinic He was advised to follow-up with orthopedic surgeon in [place]  Elevated liver chemistries-related to alcohol abuse: Advised alcohol cessation  Chronic back pain with flare: Lumbar x-ray nonacute changes Advised follow-up with orthopedics Outpatient PT, OT  Hypertension: Stable-continue current medications  Acute hypoxic respiratory failure: Resolved No evidence of PE or pulmonary edema  Type 2 diabetes with hyperglycemia: A1c 6.2%, controlled No change in medication  Alcoholic polyneuropathy: Continue gabapentin  Refractory post repletion  hypokalemia: Continue potassium supplement   Back worse since fall. Pain in lower back in center w/o radiation.  No radiation now.  The only thing that helped was the oxycodone.  No muscle spasms.  Meloxicam has helped him as well and his girlfriend wondered if that something he could take.  He feels the foot wound is doing better-it does not seem to be as deep.  He is still taking the Augmentin as prescribed.  He plans on contacting [facility] therapy to help him with his alcohol cessation and also wants to be in touch with someone locally-he does have names and numbers that he can call.  He does not plan on going to a rehab facility.     Medications and allergies reviewed with patient and updated if appropriate.  Patient Active Problem List   Diagnosis Date Noted   Type 2 diabetes mellitus with foot ulcer ([place]) 09/02/2021   Hypokalemia 09/02/2021   Hypomagnesemia 09/02/2021   Thrombocytopenia ([place]) 09/02/2021   Elevated LFTs 09/02/2021   Alcohol dependence with withdrawal ([place]) 09/01/2021   Critical lower limb ischemia ([place]) 05/29/2021   Alcohol abuse 04/28/2021   Diabetic foot ulcer ([place]) 04/28/2021   Severe sepsis ([place]) 04/28/2021   Osteomyelitis ([place]) 04/27/2021   Anxiety 03/17/2021   Amputated toe of right foot ([place]) 40/98/1191   Alcoholic peripheral neuropathy ([place]) 10/28/2019   Chronic diastolic CHF (congestive heart failure) ([place]) 10/15/2019   Chronic foot ulcer ([place]) 47/82/9562   Alcoholic cirrhosis of liver without ascites ([place]) 04/27/2019   Obesity hypoventilation syndrome ([place]) 04/27/2019   Depression 04/25/2019   Severe alcohol use disorder ([place])    DOE (dyspnea on exertion) 04/22/2019   Hypothyroidism 04/22/2019   Anemia 02/06/2019  Acute on chronic diastolic CHF (congestive heart failure) ([place]) 01/30/2019   Avascular necrosis of hip, left ([place]) 11/02/2018   Decreased hearing of both ears 10/30/2018   Avascular necrosis of hip, right ([place]) 08/16/2018    Bilateral leg edema 02/27/2018   Marijuana abuse 02/16/2018   Ulcer of left heel ([place]) 12/21/2016   Bilateral carpal tunnel syndrome 12/15/2016   Hyperlipidemia 11/25/2016   Prediabetes 11/25/2016   Hypertension 11/23/2016   History of osteomyelitis 11/23/2016   Morbid obesity ([place]) 11/23/2016   OSA (obstructive sleep apnea) 11/23/2016   Other hammer toe (acquired) 11/11/2013   Exstrophy of bladder 11/11/2013    Current Outpatient Medications on File Prior to Visit  Medication Sig Dispense Refill   acamprosate (CAMPRAL) 333 MG tablet Take 333 mg by mouth 2 (two) times daily.     amLODipine (NORVASC) 5 MG tablet Take 1 tablet (5 mg total) by mouth daily. 30 tablet 0   amoxicillin-clavulanate (AUGMENTIN) 875-125 MG tablet Take 1 tablet by mouth 2 (two) times daily. 60 tablet 11   doxycycline (VIBRA-TABS) 100 MG tablet Take 1 tablet (100 mg total) by mouth 2 (two) times daily. 60 tablet 11   folic acid (FOLVITE) 1 MG tablet Take 1 tablet (1 mg total) by mouth daily. 90 tablet 0   furosemide (LASIX) 40 MG tablet TAKE 1 TABLET BY MOUTH TWICE A DAY (Patient taking differently: Take 80 mg by mouth 2 (two) times daily.) 180 tablet 1   gabapentin (NEURONTIN) 100 MG capsule Take 1 capsule (100 mg total) by mouth 3 (three) times daily. (Patient taking differently: Take 300 mg by mouth 2 (two) times daily.) 90 capsule 3   gabapentin (NEURONTIN) 300 MG capsule Take by mouth.     HEMP OIL-VANILLYL BUTYL ETHER EX Apply 1 application topically 2 (two) times daily as needed (arthritis pain).     hydrOXYzine (ATARAX/VISTARIL) 25 MG tablet Take 25 mg by mouth 2 (two) times daily.     levothyroxine (SYNTHROID) 50 MCG tablet Take 50 mcg by mouth daily before breakfast.     LORazepam (ATIVAN) 1 MG tablet Take 1 mg by mouth daily as needed for anxiety.     metoprolol tartrate (LOPRESSOR) 25 MG tablet Take 1 tablet (25 mg total) by mouth 2 (two) times daily. 60 tablet 0   Multiple Vitamin (MULTIVITAMIN WITH  MINERALS) TABS tablet Take 1 tablet by mouth daily. 90 tablet 0   naproxen sodium (ALEVE) 220 MG tablet Take 220 mg by mouth every 12 (twelve) hours.     potassium chloride SA (KLOR-CON M20) 20 MEQ tablet Take 3 tablets (60 mEq total) by mouth 2 (two) times daily. 180 tablet 0   saccharomyces boulardii (FLORASTOR) 250 MG capsule Take 1 capsule (250 mg total) by mouth 2 (two) times daily. 60 capsule 0   senna-docusate (SENOKOT-S) 8.6-50 MG tablet Take 2 tablets by mouth at bedtime as needed for up to 10 days for mild constipation. 20 tablet 0   thiamine 100 MG tablet Take 1 tablet (100 mg total) by mouth daily. 90 tablet 0   traZODone (DESYREL) 50 MG tablet TAKE 1-2 TABLETS (50-100 MG TOTAL) BY MOUTH AT BEDTIME AS NEEDED FOR SLEEP. (Patient taking differently: Take 50-100 mg by mouth See admin instructions. Take 21m to 102mby mouth at bedtime as needed for sleep) 180 tablet 1   vitamin B-12 (CYANOCOBALAMIN) 100 MCG tablet Take 100 mcg by mouth daily.     No current facility-administered medications on file prior to visit.  Past Medical History:  Diagnosis Date   Alcohol dependence ([place])    Arthritis    hips, hands   Bilateral carpal tunnel syndrome    Bilateral leg edema    Chronic   CHF (congestive heart failure) (H[facility])    Diabetes mellitus without complication ([facility])    type 2   Diverticulitis    portion of colon removed   DOE (dyspnea on exertion)    occ   Elevated liver enzymes    Fatty liver    GERD (gastroesophageal reflux disease)    occ   Hammer toe    Hip pain    History of ventral hernia repair 2016   x2   Hyperlipidemia    pt unsure   Hypertension    Marijuana abuse    Morbid obesity ([place])    Neuromuscular disorder ([place])    peripheral neuropathy feet and few fingers   OSA (obstructive sleep apnea)    has OSA-not used CPAP 2-3 yrs could not tolerate cpap   PONV (postoperative nausea and vomiting)    Toe ulcer ([place])    left healed    Past Surgical History:   Procedure Laterality Date   AMPUTATION TOE Left 05/25/2018   Procedure: AMPUTATION TOE left 3rd;  Surgeon: Wylene Simmer, MD;  Location: [facility];  Service: Orthopedics;  Laterality: Left;  62mn, to follow   AMPUTATION TOE Left 06/28/2018   Procedure: Left foot revision 3rd toe amputation including 3rd metatarsal;  Surgeon: HWylene Simmer MD;  [facility]  Service: Orthopedics;  Laterality: Left;  654m   BICEPS TENDON REPAIR Left 2014   Partial   COLONOSCOPY     GRAFT APPLICATION Right 02/20/08/3267 Procedure: GRAFT APPLICATION;  Surgeon: StLandis MartinsDPM;  [facility] Service: Podiatry;  Laterality: Right;   HERNIA REPAIR  2016   ventral   HIP CLOSED REDUCTION Right 09/01/2018   Procedure: CLOSED REDUCTION HIP;  Surgeon: RoNicholes StairsMD;  Location: [facility];  Service: Orthopedics;  Laterality: Right;   INCISION AND DRAINAGE OF WOUND Right 03/03/2020   Procedure: IRRIGATION AND DEBRIDEMENT WOUND;  Surgeon: StLandis MartinsDPM;  [facility] Service: Podiatry;  Laterality: Right;   JOINT REPLACEMENT     b/l knees    METATARSAL HEAD EXCISION Right 03/03/2020   Procedure: IRRIGATION OF TOE AND CAUTERIZATION OF BLEEDING TOE;  Surgeon: StLandis MartinsDPM;  [facility] Service: Podiatry;  Laterality: Right;   METATARSAL HEAD EXCISION Right 03/03/2020   Procedure: METATARSAL HEAD EXCISION SECOND TOE RIGHT;  Surgeon: StLandis MartinsDPM;  [facility] Service: [facility]iatry;  Laterality: Right;  MAC WITH LOCAL   TOE AMPUTATION Right 09/2013   TOTAL HIP ARTHROPLASTY Right 08/16/2018   Procedure: TOTAL HIP ARTHROPLASTY ANTERIOR APPROACH;  Surgeon: SwRod CanMD;  [facility];  Service: Orthopedics;  Laterality: Right;   TOTAL HIP ARTHROPLASTY Left 11/02/2018   Procedure: TOTAL HIP ARTHROPLASTY ANTERIOR APPROACH;  Surgeon: SwRod CanMD;  Location[facility];   Service: Orthopedics;  Laterality: Left;   TOTAL KNEE ARTHROPLASTY     bilat    Social History   Socioeconomic History   Marital status: Divorced    Spouse name: Not on file   Number of children: Not on file   Years of education: Not on file   Highest education level: Not on file  Occupational History   Occupation: unemployed, fiHydrographic surveyor  for disability  Tobacco Use   Smoking status: Former    Packs/day: 0.25    Years: 10.00    Pack years: 2.50    Types: Cigarettes   Smokeless tobacco: Never   Tobacco comments:    quit 2018  Vaping Use   Vaping Use: Never used  Substance and Sexual Activity   Alcohol use: Not Currently    Comment: 2-3 drinks 4-5 nights a week, liquor   Drug use: Yes    Frequency: 1.0 times per week    Types: Marijuana    Comment: "4 times a month, maybe"   Sexual activity: Not on file  Other Topics Concern   Not on file  Social History Narrative   Not on file   Social Determinants of Health   Financial Resource Strain: Not on file  Food Insecurity: No Food Insecurity   Worried About Charity fundraiser in the Last Year: Never true   Ran Out of Food in the Last Year: Never true  Transportation Needs: No Transportation Needs   Lack of Transportation (Medical): No   Lack of Transportation (Non-Medical): No  Physical Activity: Not on file  Stress: Not on file  Social Connections: Not on file    Family History  Adopted: Yes  Family history unknown: Yes    Review of Systems  Constitutional:  Negative for chills and fever.  HENT:  Positive for congestion.   Respiratory:  Negative for cough, shortness of breath and wheezing.   Cardiovascular:  Negative for chest pain, palpitations and leg swelling.  Musculoskeletal:  Positive for back pain (no radiculopathy).  Neurological:  Negative for light-headedness and headaches.      Objective:   Vitals:   09/09/21 1302  BP: 124/80  Pulse: 98  Temp: 98.4 F (36.9 C)  SpO2: 98%   BP Readings from  Last 3 Encounters:  09/09/21 124/80  09/05/21 (!) 160/92  06/05/21 (!) 146/100   Wt Readings from Last 3 Encounters:  09/09/21 278 lb (126.1 kg)  09/02/21 271 lb 2.7 oz (123 kg)  05/29/21 271 lb (122.9 kg)   Body mass index is 36.68 kg/m.   Physical Exam    Constitutional: Appears well-developed and well-nourished. No distress.  HENT:  Head: Normocephalic and atraumatic.  Neck: Neck supple. No tracheal deviation present. No thyromegaly present.  No cervical lymphadenopathy Cardiovascular: Normal rate, regular rhythm and normal heart sounds.   No murmur heard. No carotid bruit .  Mild bilateral lower extremity edema Pulmonary/Chest: Effort normal and breath sounds normal. No respiratory distress. No has no wheezes. No rales. Abdomen: Obese, soft, nontender Musculoskeletal: No tenderness along lumbar spine or across lumbar back with palpation, no deformity or swelling in lower back Skin: Skin is warm and dry. Not diaphoretic.  Psychiatric: Normal mood and affect. Behavior is normal.      Assessment & Plan:    See Problem List for Assessment and Plan of chronic medical problems.

## 2021-09-09 NOTE — Assessment & Plan Note (Signed)
Chronic Following with infectious disease-he will schedule follow-up Also following with wound center-he will schedule a follow-up Taking Augmentin twice daily

## 2021-09-15 ENCOUNTER — Encounter (HOSPITAL_BASED_OUTPATIENT_CLINIC_OR_DEPARTMENT_OTHER): Payer: Medicare Other | Admitting: Internal Medicine

## 2021-09-16 ENCOUNTER — Other Ambulatory Visit: Payer: Self-pay

## 2021-09-16 ENCOUNTER — Telehealth: Payer: Self-pay | Admitting: Internal Medicine

## 2021-09-16 ENCOUNTER — Encounter (HOSPITAL_BASED_OUTPATIENT_CLINIC_OR_DEPARTMENT_OTHER): Payer: Medicare Other | Admitting: Physician Assistant

## 2021-09-16 DIAGNOSIS — L97528 Non-pressure chronic ulcer of other part of left foot with other specified severity: Secondary | ICD-10-CM | POA: Diagnosis not present

## 2021-09-16 DIAGNOSIS — M86372 Chronic multifocal osteomyelitis, left ankle and foot: Secondary | ICD-10-CM | POA: Diagnosis not present

## 2021-09-16 DIAGNOSIS — M14672 Charcot's joint, left ankle and foot: Secondary | ICD-10-CM | POA: Diagnosis not present

## 2021-09-16 DIAGNOSIS — Z87891 Personal history of nicotine dependence: Secondary | ICD-10-CM | POA: Diagnosis not present

## 2021-09-16 DIAGNOSIS — E11621 Type 2 diabetes mellitus with foot ulcer: Secondary | ICD-10-CM | POA: Diagnosis present

## 2021-09-16 MED ORDER — OXYCODONE HCL 5 MG PO TABS
5.0000 mg | ORAL_TABLET | Freq: Three times a day (TID) | ORAL | 0 refills | Status: DC | PRN
Start: 2021-09-16 — End: 2021-10-01

## 2021-09-16 NOTE — Telephone Encounter (Signed)
Patient states he is unable to be seen by an orthopedic for back pain until 12-6  Patient is requesting a refill for Oxycodone prescribed at last ov on 11-23 for back pain   Pharmacy [id number] - [place],  - [address]

## 2021-09-16 NOTE — Progress Notes (Addendum)
JKWON, TESSMANN ([id number]) Visit Report for 09/16/2021 Chief Complaint Document Details Patient Name: Date of Service: [name], [name] 09/16/2021 9:45 A M Medical Record Number: [id number] Patient Account Number: [id number] Date of Birth/Sex: Treating RN: 09-Nov-1960 (60 y.o. M) Primary Care Provider: Billey Gosling Other Clinician: Referring Provider: Treating Provider/Extender: Adele Schilder in Treatment: 14 Information Obtained from: Patient Chief Complaint Bilateral foot diabetic ulcers Electronic Signature(s) Signed: 09/16/2021 10:00:13 AM By: Worthy Keeler PA-C Entered By: Worthy Keeler on 09/16/2021 10:00:13 -------------------------------------------------------------------------------- Debridement Details Patient Name: Date of Service: [name], [name] IG 09/16/2021 9:45 A M Medical Record Number: [id number] Patient Account Number: [id number] Date of Birth/Sex: Treating RN: [birth year] (60 y.o. [name] Primary Care Provider: Billey Gosling Other Clinician: Referring Provider: Treating Provider/Extender: Adele Schilder in Treatment: 14 Debridement Performed for Assessment: Wound #3 Left,Plantar Foot Performed By: Physician Worthy Keeler, PA Debridement Type: Debridement Severity of Tissue Pre Debridement: Fat layer exposed Level of Consciousness (Pre-procedure): Awake and Alert Pre-procedure Verification/Time Out Yes - 10:48 Taken: Start Time: 10:49 Pain Control: Other : Benzocaine 20% T Area Debrided (L x W): otal 5 (cm) x 4 (cm) = 20 (cm) Tissue and other material debrided: Viable, Non-Viable, Callus, Slough, Subcutaneous, Skin: Dermis , Skin: Epidermis, Fibrin/Exudate, Slough Level: Skin/Subcutaneous Tissue Debridement Description: Excisional Instrument: Curette Bleeding: Minimum Hemostasis Achieved: Silver Nitrate End Time: 10:53 Procedural Pain: 0 Post Procedural Pain: 0 Response to Treatment: Procedure was  tolerated well Level of Consciousness (Post- Awake and Alert procedure): Post Debridement Measurements of Total Wound Length: (cm) 4.2 Width: (cm) 3.7 Depth: (cm) 0.2 Volume: (cm) 2.441 Character of Wound/Ulcer Post Debridement: Stable Severity of Tissue Post Debridement: Fat layer exposed Post Procedure Diagnosis Same as Pre-procedure Electronic Signature(s) Signed: 09/16/2021 5:54:55 PM By: Deon Pilling RN, BSN Signed: 09/16/2021 6:11:30 PM By: Worthy Keeler PA-C Entered By: Deon Pilling on 09/16/2021 10:53:52 -------------------------------------------------------------------------------- HPI Details Patient Name: Date of Service: [name] IG 09/16/2021 9:45 A M Medical Record Number: [id number] Patient Account Number: [id number] Date of Birth/Sex: Treating RN: 1961-06-15 (60 y.o. M) Primary Care Provider: Billey Gosling Other Clinician: Referring Provider: Treating Provider/Extender: Adele Schilder in Treatment: 14 History of Present Illness HPI Description: 10/31/2019 upon evaluation today patient appears to be doing somewhat poorly in regard to his bilateral plantar feet. He has wounds that he tells me have been present since 2012 intermittently off and on. Most recently this has been open for at least the past 6 months to a year. He has been trying to treat this in different ways using Santyl along with various other dressings including Medihoney and even at one point Xeroform. Nothing really has seem to get this completely closed. He was recently in the hospital for cellulitis of his leg subsequently he did have x-rays as well as MRIs that showed negative for any signs of osteomyelitis in regard to the wounds on his feet. Fortunately there is no signs of systemic infection at this time. No fevers, chills, nausea, vomiting, or diarrhea. Patient has previously used Darco offloading shoes as far as frontal floaters as well as postop surgical shoes. He has  never been in a total contact cast that may be something we need to strongly consider here. Patient's most recent hemoglobin A1c 1 month ago was 5.3 seems to be very well controlled which is great. Subsequently he has seen vascular as well as podiatry. His ABIs are 1.07 on the left and 1.14  on the right he seems to be doing well he does have chronic venous stasis. 11/07/2019 upon evaluation today patient appears to be doing well with regard to his wounds all things considered. I do not see any severe worsening he still has some callus buildup on the right more than the left he notes that he has been probably more active than he should as far as walking is concerned is just very hard to not be active. He knows he needs to be more careful in this regard however. He is willing to give the cast a try at this point although he notes that he is a little nervous about this just with regard to balance although he will be very careful and obviously if he has any trouble he knows to contact the office and let me know. 1/22; patient is in for his obligatory first total contact cast change. Our intake nurse reported a very large amount of drainage which is spelled out over to the surrounding skin. Has bilateral diabetic foot wounds. He has Charcot feet. We have been using silver alginate on his wounds. 11/14/2019 on evaluation today patient is actually seeming to make good improvement in regard to his bilateral plantar foot wounds. We have been using a cast on the left side and on the right side he has been using dressings he is changing up his own accord. With that being said he tells me that he is also not walking as much just due to how unsteady he feels. He takes it easy when he does have to walk and when he does not have to walk he is resting. This is probably help in his right foot as well has the left foot which is actually measuring better. In fact both are measuring better. Overall I am very pleased with  how things seem to be progressing. The patient does have some odor on the left foot this does have me concerned about the possibility of infection, and actually probably go ahead and put him on antibiotics today as well as utilizing a continuation of the cast on the left foot I think that will be fine we probably just need to bring him in sooner to change this not last a whole week. 1/29; we brought the patient back today for a total contact cast change on the left out of concern for excessive drainage. We are using drawtex over the wound as the primary dressing 11/21/2019 on evaluation today patient appears to be doing well with regard to his left plantar foot. In fact both foot ulcers actually seem to be doing pretty well. Nonetheless he is having a lot of drainage on the left at this time and again we did obtain a wound culture did show positive for Staph aureus that was reviewed by myself today as well. Nonetheless he is on Bactrim which was shown to be sensitive that should be helping in this regard. Fortunately there is no signs of infection systemically at this point. 2/5; back in clinic today for a total contact cast change apparently secondary to very significant drainage. Still using drawtex 11/28/2019 upon evaluation today patient appears to be doing well with regard to his wounds. The right foot is doing okay as measured about the same in my opinion. The left foot is actually showing signs of significant improvement is measuring smaller there is a lot of hyper granulation likely due to the continued drainage at this point. We did obtain approval for a snap VAC I  think that is good to be appropriate for him and will likely help this tremendously underneath the cast. He is definitely in agreement with proceeding with such. 2/12; patient came in today after his snap VAC lost suction. Brought in to see one of our nurses. The dressing was replaced and then we put the cast back on and rehooked up  the snap VAC. Apparently his wound looked very good per our intake nurse. 2/15; again we replaced the cast on Friday. By Saturday the snap VAC and light suction. He called this morning he comes in acutely. The wounds look fine however the VAC is not functioning. We replaced the cast using silver alginate as the primary dressing backed with Kerramax. The snap VAC was not replaced 12/05/2019 upon evaluation today patient appears to be doing better in regard to his left plantar foot ulcer. Fortunately there is no signs of active infection at this time. Unfortunately he is continuing to have issues with the right foot he is really not making any progress here things seem to be somewhat stagnant to be honest. The depth has increased but that is due to me having debrided the wound in the past based on what I am seeing. 12/12/2019 on evaluation today patient appears to be doing more poorly in regard to the left lower extremity. He has some erythema spreading up the side of his foot I am concerned about infection again at this point. Unfortunately he has been seeing improvement with a total contact cast but I do not think we should put that on today. On his right plantar foot he continues to have significant drainage this is actually measuring deeper I really do not feel like you are making any progress whatsoever. I have prescribed Granix for him unfortunately his insurance apparently was going to cost him a $500 co-pay. 12/19/2019 upon evaluation today patient actually appears to be doing better in regard to both wounds. With that being said he actually did get the reGranix which he had to pay $500 for. With that being said it does look like that he is actually made some improvement based on what I am seeing at this point with the reGranix. Obviously if he is going to continue this we are going to do something about trying to get him some help in covering the cost. 12/26/2019 on evaluation today patient appears to  be doing really much better even compared to last week. Overall the wound seems to be much better even compared to last week and last week was better than the week before. Since has been using the reGranix his symptoms have improved significantly. With that being said the issue right now is simply that this is a very expensive medication for him the first dose cost him $500. Upon inspection patient's wound bed actually is however dramatically improved compared to before he started this 2 weeks ago. 01/02/2020 upon evaluation today patient appears to actually be doing well. He still had a little bit of reGranix left that has been using in small amounts he just been applying it every other day instead of every day in order to make it go longer. Overall we are still seeing excellent improvement he is measuring smaller looking better healthier tissue and everything seems to be pointing to this headed in the right direction. Fortunately there is no evidence of infection either which is also excellent news. He does have his MRI coming up within the next week. 01/09/2020 upon evaluation today patient appears to  be doing a little worse today compared to previous week's evaluation. He is actually been out of the reGranix at this point. He has been trying to make the stretch out so he has been changing the dressings on a regular set schedule like he was previous. I think this has made a difference. Fortunately there is no signs of active infection at this time. No fevers, chills, nausea, vomiting, or diarrhea. 01/16/2020 upon evaluation today patient appears to be doing well with regard to his left plantar foot ulcer. The right plantar foot still shows some significant depth at this point. Fortunately there is no signs of active infection at this time. 01/30/2020 upon evaluation today patient appears to be doing about the same in regard to his right plantar foot ulcer there is still some depth here and we had  to wait till he actually switched over to his new insurance to get approval for the MRI under his new insurance plan. With that being said he now has switched as of April 1. Fortunately there is no signs of active infection at this time. Overall in regard to his left foot ulcer this seems to be doing much better and I am actually very pleased with how things are going. With that being said it is not quite as much progress as we were seeing with the reGranix but at the same time he has had trouble getting this apparently there is been some hindrance here. I Georgina Peer try to actually send this to [facility] that was recommended by the drug rep to me. 02/13/20 upon evaluation today patient appears to be doing better in regard to his left foot ulcer this is great news. Unfortunately the right foot ulcer is not really significantly better at this time. There is no signs of active infection systemically though he did have his MRI which showed unfortunately he does have infection noted including an abscess in the foot. There is also marrow changes noted which are consistent with osteomyelitis based on the radiology review and interpretation. Unfortunately considering that the wound is not really making the progress that we will he would like to be seen I think that this is an indication that he may need some further referral both infectious disease as well as potentially to podiatrist to see if there is anything that can be done to help with the situation that were dealing with here. The left foot again is doing great. READMISSION 06/04/2021 This is a 60 year old man who was in the clinic in 2021 followed by Jeri Cos for areas on the right and left foot. He developed a left foot infection and was referred to ID. He left the clinic in a nonhealed state and was followed for a period of [facility] Cannon Kettle. Apparently things really deteriorated in early July when he was admitted to  hospital from 04/27/2021 through 05/06/2021 with sepsis secondary to a left foot infection. His blood cultures were negative. An MRI suggested fifth metatarsal osteomyelitis a left ankle septic joint. He was treated with vancomycin and ertapenem which he is still taking and may just about be finishing. He was seen by orthopedics and the patient adamantly refused to BKA. As far as I can tell he did not have the ankle aspirated I am not exactly sure what the issue was here. Since he has been discharged [name]. He was last seen by Dr. Drucilla Schmidt on 05/20/2021 he noted osteo of the  tibial talar bone cuboid and fifth metatarsal which is even more extensive than what was suggested by the MRI. He is apparently going for a consultation with orthopedic surgery in [place] sometime next week. I received a call about this man 2 weeks ago from Dr. Gwenlyn Found who follows him for the possibility of PAD. ABIs I think done in the office showed a ABI on the right of 1.05 at the PTA and 0.99 at the PTA on the left. He had a DVT rule out in the left leg that was negative for the DVT. Past medical history is extensive and includes diastolic heart failure, right first metatarsal head ulcer in 2021, excision of the right second ray by Dr. Cannon Kettle on 03/13/2020, hypertension, hypothyroidism. Left total hip replacement, right total knee replacement, carpal tunnel syndrome, obstructive sleep apnea alcohol abuse with cirrhosis although the patient denies current alcohol intake. The patient does not think he is a diabetic however looking through [facility] link I see 2 HgbAic's of this year that were greater than equal to 6.5 which by definition makes him diabetic. Nevertheless he is not on any treatment and does not check his blood sugars. The patient is now back home out of the nursing home. Saw Dr. Drucilla Schmidt last week he was taken off vancomycin and ertapenem on August 22 and now is on doxycycline  on Augmentin. He also saw Dr. Buford Dresser who is his orthopedic surgeon in [place] he recommended a [employer]. He has been using Medihoney. 9/6I have been having trouble getting hyperbarics approved through our prior authorization process. Even though he had a limb threatening infection in the left foot and probably the left ankle there glitches in how some of the reports are worded also some of the consultants. In any case I am going to repeat his sedimentation rate and C-reactive protein. I am generally not in favor of doing things like this as they really do not alter the plan of care from my point of view however I am going to need to demonstrate that these remain high in order to get this through forhyperbaric treatment for chronic refractory osteomyelitis 9/13; following this man for a wound on his left plantar foot in the setting of type 2 diabetes and Charcot deformity. He has underlying chronic refractory osteomyelitis. Follow-up sedimentation rate and C-reactive protein were both elevated but the C-reactive protein was down to 1.4, sedimentation rate at 70. Sedimentation rate was only slightly down from previous at 85. His wound is measuring slightly smaller. 9/20; patient started hyperbaric oxygen therapy today and tolerated treatment well. This is for the underlying osteomyelitis. He remains on antibiotics but thinks he is getting close to finishing. The wound on the plantar aspect of his foot is the other issue we are following here. He is using Medihoney The patient has a Charcot foot in the setting of type 2 diabetes. He is going to need a total contact cast although his partner was away this week and we elected to delay this till next week 9/27; patient still tolerating hyperbaric oxygen well. Wound looked generally healthy not much depth under illumination still 100% covered in fibrinous debris. Raised callused edges around the wound he was prepared for a total contact cast. We have  been using Medihoney 9/30; patient is back for his first obligatory total contact cast change. We are using Hydrofera Blue. Noted by our intake nurse to have a lot of drainage or at least a moderate amount of drainage. I am not  sure I was previously aware of this 10/4; patient arrives today with a lot of drainage under the cast. When he had it changed last Friday there was also a similar amount of drainage. Her intake nurse says that they tried a wound VAC on him perhaps while I was on vacation in August under the cast but that did not work. In my experience that has not been unusual. We have been using Hydrofera Blue with all the secondary absorbers. The drainage today went right through all of our dressings. The patient is concerned about his foot being in a cast without much drainage. He is tolerating HBO well. There has been improvements in the wound in the mid part of his foot in the setting of a Charcot deformity 10/7; patient presents for cast change. He has no issues or complaints today. He denies signs of infection. 10/11; patient presents for cast change. At this time he would like to take a break from the cast. He would like to do daily dressing changes with Hydrofera Blue. He currently denies signs of infection. 10/14; his cast was taken off last week at his request. He arrives in the clinic with [facility]. He has been changing the dressing himself. He has way too much edema in the left foot and leg to consider a total contact cast. I do not really know the issue here. He does have chronic venous insufficiency His wife stopped me in the clinic earlier in the week to report he is drinking again and she is concerned. I am uncertain whether there are other issues 10/18; he arrived in clinic last week having bilateral lower extremity edema likely secondary to chronic venous insufficiency there was too much edema in the left leg to apply a total contact cast. I put him in compression on  the left leg to control the swelling. This week he cut the wrap on the foot for reasons that are unclear however today he arrives in clinic with a smaller left leg but massive edema in the left foot. He is supposed to be wearing a juxta lite stocking on the right leg but he is not wearing the contact layer. His attendance at hyperbaric oxygen has been dwindling, he did not dive yesterday and he did not dive today concerned about hyperbarics causing swelling. I looked back in his record his last echo was in 2020 essentially normal left and right ventricular function. Last BUN and creatinine were done in July this was normal. He is having a lot of drainage in the left plantar foot wound 10/25; again he comes in today having missed HBO yesterday. Macerated skin around the wound which really does not look very good at all. A lot of edema in the left foot but an improvement in edema on the left leg. We wrapped him last week because of the amount of swelling in the left leg. We could not apply a total contact cast. The patient states that he wants to be able to change his dressing himself. I might consider this if he had stockings to control the swelling. He said he be here for HBO tomorrow. I will check the degree of erythema in his forefoot which I have marked. He is on doxycycline and Augmentin which was renewed by Dr. Drucilla Schmidt but I cannot see a follow-up note, follow-up inflammatory markers etc. I 1 point he said he was going to see his orthopedic surgeon in [place] I am not sure if he ever did this. I  do not know why he has not followed up with infectious disease, he says he was not given an appointment but he is still on Augmentin doxycycline 11/1; he did not do the lab work I ordered last week. Still on Augmentin and doxycycline. Silver alginate and he is changing this daily using his own juxta lite stocking. The surface of the wound does not look too bad. No real epithelialization however. The  patient was seen today along with HBO 08/26/2021 upon evaluation today patient is being seen at his request by myself he wanted to transfer care to see me. With that being said he has been seeing Dr. Dellia Nims since he came back in August of this year. Unfortunately he tells me he still having quite a bit of drainage. He is also significant erythema and warmth noted of the foot as well. He has been hit or miss with regard to hyperbaric oxygen therapy tells me that at this point he took this week off because he was extremely claustrophobic and having a lot of issues he plans to start back next week. Nonetheless he has been given some medication by Dr. Dellia Nims as well to help calm things down which again may help him. Hopefully he can get back in hyperbarics as I think this is likely necessary. Nonetheless there does seem to be evidence of cellulitis noted today as well and in general I am a little concerned in that regard. His wound unfortunately is significant on this left foot and is right where he takes the brunt of the force secondary to the Charcot arthropathy. I do not think a total contact cast is ideal due to the fact that he unfortunately is draining much too significantly he is also not happy with the idea of using a cast therefore he states he would not want to do that anyway. 09/16/2021 upon evaluation today patient's plantar foot ulcer unfortunately continues to show signs of issues here. He has been in the hospital due to trying to detox himself from alcohol he had withdrawal symptoms and subsequently was hospitalized. During that time it was recommended by both orthopedics as well as infectious disease apparently that he proceed with amputation. With that being said they discussed with him the risk of not doing so. This is well- documented in the encounter. With that being said the patient does not want to proceed down that road and tells me that he declined their advice in that  regard. Subsequently our goal then is to try to do what we can to try to get this thing healed and closed. I think this means he is getting need to stay off of it is much as possible he does have a wheelchair at home that he tells me he can use I think that that is going to be something that he does need to do. Electronic Signature(s) Signed: 09/16/2021 5:57:41 PM By: Worthy Keeler PA-C Entered By: Worthy Keeler on 09/16/2021 17:57:41 -------------------------------------------------------------------------------- Physical Exam Details Patient Name: Date of Service: [name], [name] IG 09/16/2021 9:45 A M Medical Record Number: [id number] Patient Account Number: [id number] Date of Birth/Sex: Treating RN: 07-Jun-1961 (60 y.o. M) Primary Care Provider: Billey Gosling Other Clinician: Referring Provider: Treating Provider/Extender: Landis Martins Weeks in Treatment: 4 Constitutional Well-nourished and well-hydrated in no acute distress. Respiratory normal breathing without difficulty. Psychiatric this patient is able to make decisions and demonstrates good insight into disease process. Alert and Oriented x 3. pleasant and cooperative. Notes Upon inspection  patient's wound bed did have some callus around the edges of the wound I did perform debridement to clear this away today. Subsequently I did also after clearing away the slough and biofilm from the surface of the wound use silver nitrate to try to knock back some of the hypergranulation as this does appear to be bulging outward towards the plantar aspect of his foot which is make it difficult for any new skin to grow. Electronic Signature(s) Signed: 09/16/2021 5:58:10 PM By: Worthy Keeler PA-C Entered By: Worthy Keeler on 09/16/2021 17:58:10 -------------------------------------------------------------------------------- Physician Orders Details Patient Name: Date of Service: [name], [name] IG 09/16/2021 9:45 A M Medical  Record Number: [id number] Patient Account Number: [id number] Date of Birth/Sex: Treating RN: 1960-11-06 (60 y.o. [name] Primary Care Provider: Billey Gosling Other Clinician: Referring Provider: Treating Provider/Extender: Adele Schilder in Treatment: [phone] Verbal / Phone Orders: No Diagnosis Coding ICD-10 Coding Code Description E11.621 Type 2 diabetes mellitus with foot ulcer M14.672 Charcot's joint, left ankle and foot L97.528 Non-pressure chronic ulcer of other part of left foot with other specified severity M86.372 Chronic multifocal osteomyelitis, left ankle and foot Follow-up Appointments ppointment in 1 week. - with Margarita Grizzle Return A Bathing/ Shower/ Hygiene May shower and wash wound with soap and water. - prior to dressing change Edema Control - Lymphedema / SCD / Other Elevate legs to the level of the heart or above for 30 minutes daily and/or when sitting, a frequency of: - throughout the day Avoid standing for long periods of time. Exercise regularly Moisturize legs daily. Compression stocking or Garment 30-40 mm/Hg pressure to: - wear the juxtalite HD to right and left leg. apply in the morning and remove at night. Off-Loading Open toe surgical shoe to: - left foot Other: - minimal weight bearing left foot use wheelchair for mobility. Additional Orders / Instructions Follow [facility] wound care orders this week; continue Home Health for wound care. May utilize formulary equivalent dressing for wound treatment orders unless otherwise specified. - Advance home health weekly and all other days patient to change. Wound Treatment Wound #3 - Foot Wound Laterality: Plantar, Left Cleanser: Soap and Water (Home Health) 1 x Per Day/15 Days Discharge Instructions: May shower and wash wound with dial antibacterial soap and water prior to dressing change. Cleanser: Wound Cleanser (Home Health) (Generic) 1 x Per Day/15 Days Discharge  Instructions: Cleanse the wound with wound cleanser prior to applying a clean dressing using gauze sponges, not tissue or cotton balls. Peri-Wound Care: Zinc Oxide Ointment 30g tube (Home Health) 1 x Per Day/15 Days Discharge Instructions: Apply Zinc Oxide as needed for any maceration or redness to periwound with each dressing change Peri-Wound Care: Sween Lotion (Moisturizing lotion) (Home Health) 1 x Per Day/15 Days Discharge Instructions: Apply moisturizing lotion as directed Prim Dressing: KerraCel Ag Gelling Fiber Dressing, 4x5 in (silver alginate) (Home Health) (Generic) 1 x Per Day/15 Days ary Discharge Instructions: Apply silver alginate to wound bed as instructed Secondary Dressing: ABD Pad, 5x9 (Home Health) (Generic) 1 x Per Day/15 Days Discharge Instructions: Apply over primary dressing as directed. Secondary Dressing: Zetuvit Plus 4x8 in [facility]) (Generic) 1 x Per Day/15 Days Discharge Instructions: Apply over primary dressing as directed. Secured With: [employer], 4.5x3.1 (in/yd) (Home Health) (Generic) 1 x Per Day/15 Days Discharge Instructions: Secure with Kerlix as directed. Secured With: 33M Medipore H Soft Cloth Surgical Tape, 2x2 (in/yd) (Home Health) (Generic) 1 x Per Day/15  Days Discharge Instructions: Secure dressing with tape as directed. Electronic Signature(s) Signed: 09/16/2021 5:54:55 PM By: Deon Pilling RN, BSN Signed: 09/16/2021 6:11:30 PM By: Worthy Keeler PA-C Entered By: Deon Pilling on 09/16/2021 10:57:24 -------------------------------------------------------------------------------- Problem List Details Patient Name: Date of Service: [name], [name] IG 09/16/2021 9:45 A M Medical Record Number: [id number] Patient Account Number: [id number] Date of Birth/Sex: Treating RN: [birth year] (60 y.o. M) Primary Care Provider: Billey Gosling Other Clinician: Referring Provider: Treating Provider/Extender: Adele Schilder in  Treatment: 14 Active Problems ICD-10 Encounter Code Description Active Date MDM Diagnosis E11.621 Type 2 diabetes mellitus with foot ulcer 06/04/2021 No Yes M14.672 Charcot's joint, left ankle and foot 06/04/2021 No Yes L97.528 Non-pressure chronic ulcer of other part of left foot with other specified 06/04/2021 No Yes severity M86.372 Chronic multifocal osteomyelitis, left ankle and foot 06/04/2021 No Yes Inactive Problems Resolved Problems Electronic Signature(s) Signed: 09/16/2021 10:00:00 AM By: Worthy Keeler PA-C Entered By: Worthy Keeler on 09/16/2021 10:00:00 -------------------------------------------------------------------------------- Progress Note Details Patient Name: Date of Service: [name], [name] IG 09/16/2021 9:45 A M Medical Record Number: [id number] Patient Account Number: [id number] Date of Birth/Sex: Treating RN: October 15, 1961 (60 y.o. M) Primary Care Provider: Billey Gosling Other Clinician: Referring Provider: Treating Provider/Extender: Adele Schilder in Treatment: 14 Subjective Chief Complaint Information obtained from Patient Bilateral foot diabetic ulcers History of Present Illness (HPI) 10/31/2019 upon evaluation today patient appears to be doing somewhat poorly in regard to his bilateral plantar feet. He has wounds that he tells me have been present since 2012 intermittently off and on. Most recently this has been open for at least the past 6 months to a year. He has been trying to treat this in different ways using Santyl along with various other dressings including Medihoney and even at one point Xeroform. Nothing really has seem to get this completely closed. He was recently in the hospital for cellulitis of his leg subsequently he did have x-rays as well as MRIs that showed negative for any signs of osteomyelitis in regard to the wounds on his feet. Fortunately there is no signs of systemic infection at this time. No fevers, chills,  nausea, vomiting, or diarrhea. Patient has previously used Darco offloading shoes as far as frontal floaters as well as postop surgical shoes. He has never been in a total contact cast that may be something we need to strongly consider here. Patient's most recent hemoglobin A1c 1 month ago was 5.3 seems to be very well controlled which is great. Subsequently he has seen vascular as well as podiatry. His ABIs are 1.07 on the left and 1.14 on the right he seems to be doing well he does have chronic venous stasis. 11/07/2019 upon evaluation today patient appears to be doing well with regard to his wounds all things considered. I do not see any severe worsening he still has some callus buildup on the right more than the left he notes that he has been probably more active than he should as far as walking is concerned is just very hard to not be active. He knows he needs to be more careful in this regard however. He is willing to give the cast a try at this point although he notes that he is a little nervous about this just with regard to balance although he will be very careful and obviously if he has any trouble he knows to contact the office and let me know. 1/22; patient is in  for his obligatory first total contact cast change. Our intake nurse reported a very large amount of drainage which is spelled out over to the surrounding skin. Has bilateral diabetic foot wounds. He has Charcot feet. We have been using silver alginate on his wounds. 11/14/2019 on evaluation today patient is actually seeming to make good improvement in regard to his bilateral plantar foot wounds. We have been using a cast on the left side and on the right side he has been using dressings he is changing up his own accord. With that being said he tells me that he is also not walking as much just due to how unsteady he feels. He takes it easy when he does have to walk and when he does not have to walk he is resting. This is  probably help in his right foot as well has the left foot which is actually measuring better. In fact both are measuring better. Overall I am very pleased with how things seem to be progressing. The patient does have some odor on the left foot this does have me concerned about the possibility of infection, and actually probably go ahead and put him on antibiotics today as well as utilizing a continuation of the cast on the left foot I think that will be fine we probably just need to bring him in sooner to change this not last a whole week. 1/29; we brought the patient back today for a total contact cast change on the left out of concern for excessive drainage. We are using drawtex over the wound as the primary dressing 11/21/2019 on evaluation today patient appears to be doing well with regard to his left plantar foot. In fact both foot ulcers actually seem to be doing pretty well. Nonetheless he is having a lot of drainage on the left at this time and again we did obtain a wound culture did show positive for Staph aureus that was reviewed by myself today as well. Nonetheless he is on Bactrim which was shown to be sensitive that should be helping in this regard. Fortunately there is no signs of infection systemically at this point. 2/5; back in clinic today for a total contact cast change apparently secondary to very significant drainage. Still using drawtex 11/28/2019 upon evaluation today patient appears to be doing well with regard to his wounds. The right foot is doing okay as measured about the same in my opinion. The left foot is actually showing signs of significant improvement is measuring smaller there is a lot of hyper granulation likely due to the continued drainage at this point. We did obtain approval for a snap VAC I think that is good to be appropriate for him and will likely help this tremendously underneath the cast. He is definitely in agreement with proceeding with such. 2/12; patient  came in today after his snap VAC lost suction. Brought in to see one of our nurses. The dressing was replaced and then we put the cast back on and rehooked up the snap VAC. Apparently his wound looked very good per our intake nurse. 2/15; again we replaced the cast on Friday. By Saturday the snap VAC and light suction. He called this morning he comes in acutely. The wounds look fine however the VAC is not functioning. We replaced the cast using silver alginate as the primary dressing backed with Kerramax. The snap VAC was not replaced 12/05/2019 upon evaluation today patient appears to be doing better in regard to his left plantar foot  ulcer. Fortunately there is no signs of active infection at this time. Unfortunately he is continuing to have issues with the right foot he is really not making any progress here things seem to be somewhat stagnant to be honest. The depth has increased but that is due to me having debrided the wound in the past based on what I am seeing. 12/12/2019 on evaluation today patient appears to be doing more poorly in regard to the left lower extremity. He has some erythema spreading up the side of his foot I am concerned about infection again at this point. Unfortunately he has been seeing improvement with a total contact cast but I do not think we should put that on today. On his right plantar foot he continues to have significant drainage this is actually measuring deeper I really do not feel like you are making any progress whatsoever. I have prescribed Granix for him unfortunately his insurance apparently was going to cost him a $500 co-pay. 12/19/2019 upon evaluation today patient actually appears to be doing better in regard to both wounds. With that being said he actually did get the reGranix which he had to pay $500 for. With that being said it does look like that he is actually made some improvement based on what I am seeing at this point with the reGranix. Obviously if he  is going to continue this we are going to do something about trying to get him some help in covering the cost. 12/26/2019 on evaluation today patient appears to be doing really much better even compared to last week. Overall the wound seems to be much better even compared to last week and last week was better than the week before. Since has been using the reGranix his symptoms have improved significantly. With that being said the issue right now is simply that this is a very expensive medication for him the first dose cost him $500. Upon inspection patient's wound bed actually is however dramatically improved compared to before he started this 2 weeks ago. 01/02/2020 upon evaluation today patient appears to actually be doing well. He still had a little bit of reGranix left that has been using in small amounts he just been applying it every other day instead of every day in order to make it go longer. Overall we are still seeing excellent improvement he is measuring smaller looking better healthier tissue and everything seems to be pointing to this headed in the right direction. Fortunately there is no evidence of infection either which is also excellent news. He does have his MRI coming up within the next week. 01/09/2020 upon evaluation today patient appears to be doing a little worse today compared to previous week's evaluation. He is actually been out of the reGranix at this point. He has been trying to make the stretch out so he has been changing the dressings on a regular set schedule like he was previous. I think this has made a difference. Fortunately there is no signs of active infection at this time. No fevers, chills, nausea, vomiting, or diarrhea. 01/16/2020 upon evaluation today patient appears to be doing well with regard to his left plantar foot ulcer. The right plantar foot still shows some significant depth at this point. Fortunately there is no signs of active infection at this  time. 01/30/2020 upon evaluation today patient appears to be doing about the same in regard to his right plantar foot ulcer there is still some depth here and we had to wait till  he actually switched over to his new insurance to get approval for the MRI under his new insurance plan. With that being said he now has switched as of April 1. Fortunately there is no signs of active infection at this time. Overall in regard to his left foot ulcer this seems to be doing much better and I am actually very pleased with how things are going. With that being said it is not quite as much progress as we were seeing with the reGranix but at the same time he has had trouble getting this apparently there is been some hindrance here. I Georgina Peer try to actually send this to [facility] that was recommended by the drug rep to me. 02/13/20 upon evaluation today patient appears to be doing better in regard to his left foot ulcer this is great news. Unfortunately the right foot ulcer is not really significantly better at this time. There is no signs of active infection systemically though he did have his MRI which showed unfortunately he does have infection noted including an abscess in the foot. There is also marrow changes noted which are consistent with osteomyelitis based on the radiology review and interpretation. Unfortunately considering that the wound is not really making the progress that we will he would like to be seen I think that this is an indication that he may need some further referral both infectious disease as well as potentially to podiatrist to see if there is anything that can be done to help with the situation that were dealing with here. The left foot again is doing great. READMISSION 06/04/2021 This is a 60 year old man who was in the clinic in 2021 followed by Jeri Cos for areas on the right and left foot. He developed a left foot infection and was referred to ID. He left the clinic in a  nonhealed state and was followed for a period of [facility] Cannon Kettle. Apparently things really deteriorated in early July when he was admitted to hospital from 04/27/2021 through 05/06/2021 with sepsis secondary to a left foot infection. His blood cultures were negative. An MRI suggested fifth metatarsal osteomyelitis a left ankle septic joint. He was treated with vancomycin and ertapenem which he is still taking and may just about be finishing. He was seen by orthopedics and the patient adamantly refused to BKA. As far as I can tell he did not have the ankle aspirated I am not exactly sure what the issue was here. Since he has been discharged she is at [facility] for rehabilitation. He was last seen by Dr. Drucilla Schmidt on 05/20/2021 he noted osteo of the tibial talar bone cuboid and fifth metatarsal which is even more extensive than what was suggested by the MRI. He is apparently going for a consultation with orthopedic surgery in [place] sometime next week. I received a call about this man 2 weeks ago from Dr. Gwenlyn Found who follows him for the possibility of PAD. ABIs I think done in the office showed a ABI on the right of 1.05 at the PTA and 0.99 at the PTA on the left. He had a DVT rule out in the left leg that was negative for the DVT. Past medical history is extensive and includes diastolic heart failure, right first metatarsal head ulcer in 2021, excision of the right second ray by Dr. Cannon Kettle on 03/13/2020, hypertension, hypothyroidism. Left total hip replacement, right total knee replacement, carpal tunnel syndrome, obstructive sleep apnea alcohol abuse with cirrhosis although  the patient denies current alcohol intake. The patient does not think he is a diabetic however looking through [address] link I see 2 HgbAic's of this year that were greater than equal to 6.5 which by definition makes him diabetic. Nevertheless he is not on any treatment and does not check his blood  sugars. The patient is now back home out of the nursing home. Saw Dr. Drucilla Schmidt last week he was taken off vancomycin and ertapenem on August 22 and now is on doxycycline on Augmentin. He also saw Dr. Buford Dresser who is his orthopedic surgeon in [place] he recommended a [employer]. He has been using Medihoney. 9/6I have been having trouble getting hyperbarics approved through our prior authorization process. Even though he had a limb threatening infection in the left foot and probably the left ankle there glitches in how some of the reports are worded also some of the consultants. In any case I am going to repeat his sedimentation rate and C-reactive protein. I am generally not in favor of doing things like this as they really do not alter the plan of care from my point of view however I am going to need to demonstrate that these remain high in order to get this through forhyperbaric treatment for chronic refractory osteomyelitis 9/13; following this man for a wound on his left plantar foot in the setting of type 2 diabetes and Charcot deformity. He has underlying chronic refractory osteomyelitis. Follow-up sedimentation rate and C-reactive protein were both elevated but the C-reactive protein was down to 1.4, sedimentation rate at 70. Sedimentation rate was only slightly down from previous at 85. His wound is measuring slightly smaller. 9/20; patient started hyperbaric oxygen therapy today and tolerated treatment well. This is for the underlying osteomyelitis. He remains on antibiotics but thinks he is getting close to finishing. The wound on the plantar aspect of his foot is the other issue we are following here. He is using Medihoney The patient has a Charcot foot in the setting of type 2 diabetes. He is going to need a total contact cast although his partner was away this week and we elected to delay this till next week 9/27; patient still tolerating hyperbaric oxygen well. Wound looked generally  healthy not much depth under illumination still 100% covered in fibrinous debris. Raised callused edges around the wound he was prepared for a total contact cast. We have been using Medihoney 9/30; patient is back for his first obligatory total contact cast change. We are using Hydrofera Blue. Noted by our intake nurse to have a lot of drainage or at least a moderate amount of drainage. I am not sure I was previously aware of this 10/4; patient arrives today with a lot of drainage under the cast. When he had it changed last Friday there was also a similar amount of drainage. Her intake nurse says that they tried a wound VAC on him perhaps while I was on vacation in August under the cast but that did not work. In my experience that has not been unusual. We have been using Hydrofera Blue with all the secondary absorbers. The drainage today went right through all of our dressings. The patient is concerned about his foot being in a cast without much drainage. He is tolerating HBO well. There has been improvements in the wound in the mid part of his foot in the setting of a Charcot deformity 10/7; patient presents for cast change. He has no issues or complaints today. He denies  signs of infection. 10/11; patient presents for cast change. At this time he would like to take a break from the cast. He would like to do daily dressing changes with Hydrofera Blue. He currently denies signs of infection. 10/14; his cast was taken off last week at his request. He arrives in the clinic with [facility]. He has been changing the dressing himself. He has way too much edema in the left foot and leg to consider a total contact cast. I do not really know the issue here. He does have chronic venous insufficiency His wife stopped me in the clinic earlier in the week to report he is drinking again and she is concerned. I am uncertain whether there are other issues 10/18; he arrived in clinic last week having bilateral  lower extremity edema likely secondary to chronic venous insufficiency there was too much edema in the left leg to apply a total contact cast. I put him in compression on the left leg to control the swelling. This week he cut the wrap on the foot for reasons that are unclear however today he arrives in clinic with a smaller left leg but massive edema in the left foot. He is supposed to be wearing a juxta lite stocking on the right leg but he is not wearing the contact layer. His attendance at hyperbaric oxygen has been dwindling, he did not dive yesterday and he did not dive today concerned about hyperbarics causing swelling. I looked back in his record his last echo was in 2020 essentially normal left and right ventricular function. Last BUN and creatinine were done in July this was normal. He is having a lot of drainage in the left plantar foot wound 10/25; again he comes in today having missed HBO yesterday. Macerated skin around the wound which really does not look very good at all. A lot of edema in the left foot but an improvement in edema on the left leg. We wrapped him last week because of the amount of swelling in the left leg. We could not apply a total contact cast. The patient states that he wants to be able to change his dressing himself. I might consider this if he had stockings to control the swelling. He said he be here for HBO tomorrow. I will check the degree of erythema in his forefoot which I have marked. He is on doxycycline and Augmentin which was renewed by Dr. Drucilla Schmidt but I cannot see a follow-up note, follow-up inflammatory markers etc. I 1 point he said he was going to see his orthopedic surgeon in [place] I am not sure if he ever did this. I do not know why he has not followed up with infectious disease, he says he was not given an appointment but he is still on Augmentin doxycycline 11/1; he did not do the lab work I ordered last week. Still on Augmentin and doxycycline.  Silver alginate and he is changing this daily using his own juxta lite stocking. The surface of the wound does not look too bad. No real epithelialization however. The patient was seen today along with HBO 08/26/2021 upon evaluation today patient is being seen at his request by myself he wanted to transfer care to see me. With that being said he has been seeing Dr. Dellia Nims since he came back in August of this year. Unfortunately he tells me he still having quite a bit of drainage. He is also significant erythema and warmth noted of the foot as well.  He has been hit or miss with regard to hyperbaric oxygen therapy tells me that at this point he took this week off because he was extremely claustrophobic and having a lot of issues he plans to start back next week. Nonetheless he has been given some medication by Dr. Dellia Nims as well to help calm things down which again may help him. Hopefully he can get back in hyperbarics as I think this is likely necessary. Nonetheless there does seem to be evidence of cellulitis noted today as well and in general I am a little concerned in that regard. His wound unfortunately is significant on this left foot and is right where he takes the brunt of the force secondary to the Charcot arthropathy. I do not think a total contact cast is ideal due to the fact that he unfortunately is draining much too significantly he is also not happy with the idea of using a cast therefore he states he would not want to do that anyway. 09/16/2021 upon evaluation today patient's plantar foot ulcer unfortunately continues to show signs of issues here. He has been in the hospital due to trying to detox himself from alcohol he had withdrawal symptoms and subsequently was hospitalized. During that time it was recommended by both orthopedics as well as infectious disease apparently that he proceed with amputation. With that being said they discussed with him the risk of not doing so. This is  well- documented in the encounter. With that being said the patient does not want to proceed down that road and tells me that he declined their advice in that regard. Subsequently our goal then is to try to do what we can to try to get this thing healed and closed. I think this means he is getting need to stay off of it is much as possible he does have a wheelchair at home that he tells me he can use I think that that is going to be something that he does need to do. Patient History Information obtained from Patient. Family History Unknown History. Social History Former smoker, Marital Status - Divorced, Alcohol Use - Rarely - history of alcoholism, Drug Use - Current History - Marijuana, Caffeine Use - Moderate. Medical History Hematologic/Lymphatic Patient has history of Anemia Respiratory Patient has history of Sleep Apnea Denies history of Asthma, Chronic Obstructive Pulmonary Disease (COPD) Cardiovascular Patient has history of Congestive Heart Failure, Hypertension, Peripheral Venous Disease Gastrointestinal Patient has history of Cirrhosis Denies history of Crohnoos Endocrine Patient has history of Type II Diabetes Musculoskeletal Patient has history of Osteoarthritis Neurologic Patient has history of Neuropathy Hospitalization/Surgery History - Detox/ fall 09/01/2021-09/05/2021. Medical A Surgical History Notes nd Gastrointestinal Hx ETOH abuse Endocrine Hypothyroidism Objective Constitutional Well-nourished and well-hydrated in no acute distress. Vitals Time Taken: 10:20 AM, Weight: 270 lbs, Temperature: 98 F, Pulse: 74 bpm, Respiratory Rate: 20 breaths/min, Blood Pressure: 153/72 mmHg. Respiratory normal breathing without difficulty. Psychiatric this patient is able to make decisions and demonstrates good insight into disease process. Alert and Oriented x 3. pleasant and cooperative. General Notes: Upon inspection patient's wound bed did have some callus around  the edges of the wound I did perform debridement to clear this away today. Subsequently I did also after clearing away the slough and biofilm from the surface of the wound use silver nitrate to try to knock back some of the hypergranulation as this does appear to be bulging outward towards the plantar aspect of his foot which is make it difficult for  any new skin to grow. Integumentary (Hair, Skin) Wound #3 status is Open. Original cause of wound was Gradually Appeared. The date acquired was: 03/18/2021. The wound has been in treatment 14 weeks. The wound is located on the [place]. The wound measures 4.2cm length x 3.7cm width x 0.2cm depth; 12.205cm^2 area and 2.441cm^3 volume. There is Fat Layer (Subcutaneous Tissue) exposed. There is no tunneling noted, however, there is undermining starting at 4:00 and ending at 8:00 with a maximum distance of 0.2cm. There is additional undermining and at 9:00 and ending at 2:00 with a maximum distance of 0.5cm. There is a large amount of serosanguineous drainage noted. Foul odor after cleansing was noted. The wound margin is thickened. There is large (67-100%) red, hyper - granulation within the wound bed. There is a small (1-33%) amount of necrotic tissue within the wound bed including Adherent Slough. General Notes: callous periwound with maceration present. Assessment Active Problems ICD-10 Type 2 diabetes mellitus with foot ulcer Charcot's joint, left ankle and foot Non-pressure chronic ulcer of other part of left foot with other specified severity Chronic multifocal osteomyelitis, left ankle and foot Procedures Wound #3 Pre-procedure diagnosis of Wound #3 is a Diabetic Wound/Ulcer of the Lower Extremity located on the Left,Plantar Foot .Severity of Tissue Pre Debridement is: Fat layer exposed. There was a Excisional Skin/Subcutaneous Tissue Debridement with a total area of 20 sq cm performed by Worthy Keeler, PA. With the following  instrument(s): Curette to remove Viable and Non-Viable tissue/material. Material removed includes Callus, Subcutaneous Tissue, Slough, Skin: Dermis, Skin: Epidermis, and Fibrin/Exudate after achieving pain control using Other (Benzocaine 20%). A time out was conducted at 10:48, prior to the start of the procedure. A Minimum amount of bleeding was controlled with Silver Nitrate. The procedure was tolerated well with a pain level of 0 throughout and a pain level of 0 following the procedure. Post Debridement Measurements: 4.2cm length x 3.7cm width x 0.2cm depth; 2.441cm^3 volume. Character of Wound/Ulcer Post Debridement is stable. Severity of Tissue Post Debridement is: Fat layer exposed. Post procedure Diagnosis Wound #3: Same as Pre-Procedure Plan Follow-up Appointments: Return Appointment in 1 week. - with Glynn Octave Shower/ Hygiene: May shower and wash wound with soap and water. - prior to dressing change Edema Control - Lymphedema / SCD / Other: Elevate legs to the level of the heart or above for 30 minutes daily and/or when sitting, a frequency of: - throughout the day Avoid standing for long periods of time. Exercise regularly Moisturize legs daily. Compression stocking or Garment 30-40 mm/Hg pressure to: - wear the juxtalite HD to right and left leg. apply in the morning and remove at night. Off-Loading: Open toe surgical shoe to: - left foot Other: - minimal weight bearing left foot use wheelchair for mobility. Additional Orders / Instructions: Follow Nutritious Diet Home Health: New wound care orders this week; continue Home Health for wound care. May utilize formulary equivalent dressing for wound treatment orders unless otherwise specified. - Advance home health weekly and all other days patient to change. WOUND #3: - Foot Wound Laterality: Plantar, Left Cleanser: Soap and Water (Home Health) 1 x Per [id number] Days Discharge Instructions: May shower and wash wound with dial  antibacterial soap and water prior to dressing change. Cleanser: Wound Cleanser (Home Health) (Generic) 1 x Per Day/15 Days Discharge Instructions: Cleanse the wound with wound cleanser prior to applying a clean dressing using gauze sponges, not tissue or cotton balls. Peri-Wound Care: Zinc Oxide Ointment 30g  tube (Home Health) 1 x Per Day/15 Days Discharge Instructions: Apply Zinc Oxide as needed for any maceration or redness to periwound with each dressing change Peri-Wound Care: Sween Lotion (Moisturizing lotion) (Home Health) 1 x Per Day/15 Days Discharge Instructions: Apply moisturizing lotion as directed Prim Dressing: KerraCel Ag Gelling Fiber Dressing, 4x5 in (silver alginate) (Home Health) (Generic) 1 x Per Day/15 Days ary Discharge Instructions: Apply silver alginate to wound bed as instructed Secondary Dressing: ABD Pad, 5x9 (Home Health) (Generic) 1 x Per Day/15 Days Discharge Instructions: Apply over primary dressing as directed. Secondary Dressing: Zetuvit Plus 4x8 in [facility]) (Generic) 1 x Per Day/15 Days Discharge Instructions: Apply over primary dressing as directed. Secured With: [employer], 4.5x3.1 (in/yd) (Home Health) (Generic) 1 x Per Day/15 Days Discharge Instructions: Secure with Kerlix as directed. Secured With: 42M Medipore H Soft Cloth Surgical T ape, 2x2 (in/yd) (Home Health) (Generic) 1 x Per Day/15 Days Discharge Instructions: Secure dressing with tape as directed. 1. Would recommend currently that we continue with the silver alginate I think that still the best way to go at this point. 2. I am also can recommend that we have the patient continue to offload is much as he can I think using the wheelchair at home is probably can be the way to go here. 3. I am also going to suggest that we go ahead and have him continue with the Velcro compression wrap if he is not able to get the edema under control this way then we may need to just compression wrap and  here in the clinic. We will see patient back for reevaluation in 1 week here in the clinic. If anything worsens or changes patient will contact our office for additional recommendations. Electronic Signature(s) Signed: 09/16/2021 5:58:43 PM By: Worthy Keeler PA-C Entered By: Worthy Keeler on 09/16/2021 17:58:43 -------------------------------------------------------------------------------- HxROS Details Patient Name: Date of Service: [name], [name] IG 09/16/2021 9:45 A M Medical Record Number: [id number] Patient Account Number: [id number] Date of Birth/Sex: Treating RN: October 05, 1961 (60 y.o. [name] Primary Care Provider: Billey Gosling Other Clinician: Referring Provider: Treating Provider/Extender: Adele Schilder in Treatment: 14 Information Obtained From Patient Hematologic/Lymphatic Medical History: Positive for: Anemia Respiratory Medical History: Positive for: Sleep Apnea Negative for: Asthma; Chronic Obstructive Pulmonary Disease (COPD) Cardiovascular Medical History: Positive for: Congestive Heart Failure; Hypertension; Peripheral Venous Disease Gastrointestinal Medical History: Positive for: Cirrhosis Negative for: Crohns Past Medical History Notes: Hx ETOH abuse Endocrine Medical History: Positive for: Type II Diabetes Past Medical History Notes: Hypothyroidism Time with diabetes: 2017 Treated with: Diet Blood sugar tested every day: No Musculoskeletal Medical History: Positive for: Osteoarthritis Neurologic Medical History: Positive for: Neuropathy Immunizations Pneumococcal Vaccine: Received Pneumococcal Vaccination: No Implantable Devices None Hospitalization / Surgery History Type of Hospitalization/Surgery Detox/ fall 09/01/2021-09/05/2021 Family and Social History Unknown History: Yes; Former smoker; Marital Status - Divorced; Alcohol Use: Rarely - history of alcoholism; Drug Use: Current History -  Marijuana; Caffeine Use: Moderate; Financial Concerns: No; Food, Clothing or Shelter Needs: No; Support System Lacking: No; Transportation Concerns: No Engineer, maintenance) Signed: 09/16/2021 5:54:55 PM By: Deon Pilling RN, BSN Signed: 09/16/2021 6:11:30 PM By: Worthy Keeler PA-C Entered By: Deon Pilling on 09/16/2021 10:28:38 -------------------------------------------------------------------------------- SuperBill Details Patient Name: Date of Service: [name], [name] IG 09/16/2021 Medical Record Number: [id number] Patient Account Number: [id number] Date of Birth/Sex: Treating RN: 03-13-1961 (60 y.o. [name] Primary Care Provider: Billey Gosling Other Clinician: Referring Provider: Treating Provider/Extender:  Stone III, Elby Showers, Stacy Weeks in Treatment: 14 Diagnosis Coding ICD-10 Codes Code Description E11.621 Type 2 diabetes mellitus with foot ulcer M14.672 Charcot's joint, left ankle and foot L97.528 Non-pressure chronic ulcer of other part of left foot with other specified severity M86.372 Chronic multifocal osteomyelitis, left ankle and foot Facility Procedures CPT4 Code: [id number] Description: [id number] - DEB SUBQ TISSUE 20 SQ CM/< ICD-10 Diagnosis Description L97.528 Non-pressure chronic ulcer of other part of left foot with other specified seve Modifier: rity Quantity: 1 Physician Procedures : CPT4 Code Description Modifier [id number] - WC PHYS SUBQ TISS 20 SQ CM ICD-10 Diagnosis Description L97.528 Non-pressure chronic ulcer of other part of left foot with other specified severity Quantity: 1 Electronic Signature(s) Signed: 09/16/2021 5:59:09 PM By: Worthy Keeler PA-C Previous Signature: 09/16/2021 5:54:55 PM Version By: Deon Pilling RN, BSN Entered By: Worthy Keeler on 09/16/2021 17:59:08

## 2021-09-16 NOTE — Progress Notes (Signed)
NAQUAN, GARMAN ([id number]) Visit Report for 09/16/2021 Fall Risk Assessment Details Patient Name: Date of Service: [name], [name] 09/16/2021 9:45 A M Medical Record Number: [id number] Patient Account Number: [id number] Date of Birth/Sex: Treating RN: 12/05/1960 (60 y.o. [name] Primary Care [name]: [name] Other Clinician: Referring [name]: Treating [name]/Extender: [name] in Treatment: 14 Fall Risk Assessment Items Have you had 2 or more falls in the last 12 monthso 0 Yes Have you had any fall that resulted in injury in the last 12 monthso 0 Yes FALLS RISK SCREEN History of falling - immediate or within 3 months 25 Yes Secondary diagnosis (Do you have 2 or more medical diagnoseso) 0 No Ambulatory aid None/bed rest/wheelchair/nurse 0 No Crutches/cane/walker 15 Yes Furniture 0 No Intravenous therapy Access/Saline/Heparin Lock 0 No Gait/Transferring Normal/ bed rest/ wheelchair 0 No Weak (short steps with or without shuffle, stooped but able to lift head while walking, may seek 10 Yes support from furniture) Impaired (short steps with shuffle, may have difficulty arising from chair, head down, impaired 0 No balance) Mental Status Oriented to own ability 0 Yes Notes per patient fell three weeks ago. Electronic Signature(s) Signed: 09/16/2021 5:54:55 PM By: Shawn Stall RN, BSN Entered By: Shawn Stall on 09/16/2021 10:27:39

## 2021-09-16 NOTE — Progress Notes (Signed)
[name], [name] ([id number]) Visit Report for 09/16/2021 Arrival Information Details Patient Name: Date of Service: [name], [name] 09/16/2021 9:45 A M Medical Record Number: [id number] Patient Account Number: [id number] Date of Birth/Sex: Treating RN: 02-Apr-1961 (60 y.o. [name] Primary Care Kaidence Callaway: Billey Gosling Other Clinician: Referring Andrea Ferrer: Treating Tylyn Derwin/Extender: Adele Schilder in Treatment: 14 Visit Information History Since Last Visit Added or deleted any medications: Yes Patient Arrived: Walker Any new allergies or adverse reactions: No Arrival Time: 10:20 Had a fall or experienced change in Yes Accompanied By: self activities of daily living that may affect Transfer Assistance: None risk of falls: Patient Identification Verified: Yes Signs or symptoms of abuse/neglect since No Secondary Verification Process Completed: Yes last visito Patient Requires Transmission-Based Precautions: No Hospitalized since last visit: Yes Patient Has Alerts: Yes Implantable device outside of the clinic No excluding cellular tissue based products placed in the center since last visit: Has Dressing in Place as Prescribed: Yes Has Footwear/Offloading in Place as Yes Prescribed: Left: Surgical Shoe with Pressure Relief Insole Pain Present Now: Yes Electronic Signature(s) Signed: 09/16/2021 5:54:55 PM By: Deon Pilling RN, BSN Entered By: Deon Pilling on 09/16/2021 10:25:54 -------------------------------------------------------------------------------- Encounter Discharge Information Details Patient Name: Date of Service: [name] IG 09/16/2021 9:45 A M Medical Record Number: [id number] Patient Account Number: [id number] Date of Birth/Sex: Treating RN: 19-Mar-1961 (60 y.o. [name] Primary Care Mariafernanda Hendricksen: Billey Gosling Other Clinician: Referring Eros Montour: Treating Joseeduardo Brix/Extender: Adele Schilder in Treatment:  [id number] Encounter Discharge Information Items Post Procedure Vitals Discharge Condition: Stable Temperature (F): 98 Ambulatory Status: Walker Pulse (bpm): 74 Discharge Destination: Home Respiratory Rate (breaths/min): 20 Transportation: Private Auto Blood Pressure (mmHg): 153/72 Accompanied By: self Schedule Follow-up Appointment: Yes Clinical Summary of Care: Electronic Signature(s) Signed: 09/16/2021 5:54:55 PM By: Deon Pilling RN, BSN Entered By: Deon Pilling on 09/16/2021 10:58:35 -------------------------------------------------------------------------------- Lower Extremity Assessment Details Patient Name: Date of Service: [name], [name] IG 09/16/2021 9:45 A M Medical Record Number: [id number] Patient Account Number: [id number] Date of Birth/Sex: Treating RN: Aug 07, 1961 (60 y.o. [name] Primary Care Tiwan Schnitker: Billey Gosling Other Clinician: Referring Shashwat Cleary: Treating Kristia Jupiter/Extender: Landis Martins Weeks in Treatment: 14 Edema Assessment Assessed: [Left: Yes] [Right: No] Edema: [Left: Ye] [Right: s] Calf Left: Right: Point of Measurement: 36 cm From Medial Instep 42 cm Ankle Left: Right: Point of Measurement: 13 cm From Medial Instep 32 cm Vascular Assessment Pulses: Dorsalis Pedis Palpable: [Left:Yes] Electronic Signature(s) Signed: 09/16/2021 5:54:55 PM By: Deon Pilling RN, BSN Entered By: Deon Pilling on 09/16/2021 10:31:25 -------------------------------------------------------------------------------- Multi-Disciplinary Care Plan Details Patient Name: Date of Service: [name] IG 09/16/2021 9:45 A M Medical Record Number: [id number] Patient Account Number: [id number] Date of Birth/Sex: Treating RN: 04/02/1961 (60 y.o. [name] Primary Care Jeyden Coffelt: Billey Gosling Other Clinician: Referring Ishitha Roper: Treating Carmisha Larusso/Extender: Adele Schilder in Treatment: 14 Multidisciplinary Care Plan reviewed  with physician Active Inactive HBO Nursing Diagnoses: Anxiety related to feelings of confinement associated with the hyperbaric oxygen chamber Anxiety related to knowledge deficit of hyperbaric oxygen therapy and treatment procedures Discomfort related to temperature and humidity changes inside hyperbaric chamber Potential for barotraumas to ears, sinuses, teeth, and lungs or cerebral gas embolism related to changes in atmospheric pressure inside hyperbaric oxygen chamber Potential for oxygen toxicity seizures related to delivery of 100% oxygen at an increased atmospheric pressure Potential for pulmonary oxygen toxicity related to delivery of 100% oxygen at an increased atmospheric pressure Goals:  Barotrauma will be prevented during HBO2 Date Initiated: 06/04/2021 Target Resolution Date: 10/16/2021 Goal Status: Active Patient and/or family will be able to state/discuss factors appropriate to the management of their disease process during treatment Date Initiated: 06/04/2021 Date Inactivated: 08/04/2021 Target Resolution Date: 08/14/2021 Goal Status: Met Patient will tolerate the hyperbaric oxygen therapy treatment Date Initiated: 06/04/2021 Date Inactivated: 09/16/2021 Target Resolution Date: 09/19/2021 Goal Status: Met Patient will tolerate the internal climate of the chamber Date Initiated: 06/04/2021 Date Inactivated: 09/16/2021 Target Resolution Date: 09/19/2021 Goal Status: Met Patient/caregiver will verbalize understanding of HBO goals, rationale, procedures and potential hazards Date Initiated: 06/04/2021 Date Inactivated: 07/28/2021 Target Resolution Date: 08/14/2021 Goal Status: Met Signs and symptoms of pulmonary oxygen toxicity will be recognized and promptly addressed Date Initiated: 06/04/2021 Date Inactivated: 09/16/2021 Target Resolution Date: 09/19/2021 Goal Status: Met Signs and symptoms of seizure will be recognized and promptly addressed ; seizing patients will  suffer no harm Date Initiated: 06/04/2021 Date Inactivated: 09/16/2021 Target Resolution Date: 09/19/2021 Goal Status: Met Interventions: Administer a five (5) minute air break for patient if signs and symptoms of seizure appear and notify the hyperbaric physician Administer decongestants, per physician orders, prior to HBO2 Administer the correct therapeutic gas delivery based on the patients needs and limitations, per physician order Assess and provide for patients comfort related to the hyperbaric environment and equalization of middle ear Assess for signs and symptoms related to adverse events, including but not limited to confinement anxiety, pneumothorax, oxygen toxicity and baurotrauma Assess patient for any history of confinement anxiety Assess patient's knowledge and expectations regarding hyperbaric medicine and provide education related to the hyperbaric environment, goals of treatment and prevention of adverse events Implement protocols to decrease risk of pneumothorax in high risk patients Notes: Nutrition Nursing Diagnoses: Impaired glucose control: actual or potential Potential for alteratiion in Nutrition/Potential for imbalanced nutrition Goals: Patient/caregiver agrees to and verbalizes understanding of need to use nutritional supplements and/or vitamins as prescribed Date Initiated: 06/04/2021 Date Inactivated: 07/07/2021 Target Resolution Date: 07/04/2021 Goal Status: Met Patient/caregiver will maintain therapeutic glucose control Date Initiated: 06/04/2021 Target Resolution Date: 09/19/2021 Goal Status: Active Interventions: Assess HgA1c results as ordered upon admission and as needed Assess patient nutrition upon admission and as needed per policy Provide education on elevated blood sugars and impact on wound healing Notes: 07/07/21: Glucose control ongoing Electronic Signature(s) Signed: 09/16/2021 5:54:55 PM By: Deon Pilling RN, BSN Entered By: Deon Pilling on  09/16/2021 10:40:03 -------------------------------------------------------------------------------- Pain Assessment Details Patient Name: Date of Service: [name] IG 09/16/2021 9:45 A M Medical Record Number: [id number] Patient Account Number: [id number] Date of Birth/Sex: Treating RN: [birth year] (60 y.o. [name] Primary Care [place] Searles: Other Clinician: Billey Gosling Referring Littie Chiem: Treating Jylan Loeza/Extender: Adele Schilder in Treatment: 14 Active Problems Location of Pain Severity and Description of Pain Patient Has Paino Yes Site Locations Pain Location: Generalized Pain Rate the pain. Current Pain Level: 8 Worst Pain Level: 10 Least Pain Level: 0 Tolerable Pain Level: 8 Pain Management and Medication Current Pain Management: Medication: No Cold Application: No Rest: No Massage: No Activity: No T.E.N.S.: No Heat Application: No Leg drop or elevation: No Is the Current Pain Management Adequate: Adequate How does your wound impact your activities of daily livingo Sleep: No Bathing: No Appetite: No Relationship With Others: No Bladder Continence: No Emotions: No Bowel Continence: No Work: No Toileting: No Drive: No Dressing: No Hobbies: No Notes Chronic back pain. Electronic Signature(s) Signed: 09/16/2021 5:54:55 PM By: Deon Pilling  RN, BSN Entered By: Deon Pilling on 09/16/2021 10:26:58 -------------------------------------------------------------------------------- Patient/Caregiver Education Details Patient Name: Date of Service: [name], [name] IG 11/30/2022andnbsp9:45 A M Medical Record Number: [id number] Patient Account Number: [id number] Date of Birth/Gender: Treating RN: [birth year] (60 y.o. [name] Primary Care Physician: Billey Gosling Other Clinician: Referring Physician: Treating Physician/Extender: Adele Schilder in Treatment: 14 Education Assessment Education Provided  To: Patient Education Topics Provided Elevated Blood Sugar/ Impact on Healing: Handouts: Elevated Blood Sugars: How Do They Affect Wound Healing Methods: Explain/Verbal Responses: Reinforcements needed Electronic Signature(s) Signed: 09/16/2021 5:54:55 PM By: Deon Pilling RN, BSN Entered By: Deon Pilling on 09/16/2021 10:40:29 -------------------------------------------------------------------------------- Wound Assessment Details Patient Name: Date of Service: [name], [name] IG 09/16/2021 9:45 A M Medical Record Number: [id number] Patient Account Number: [id number] Date of Birth/Sex: Treating RN: Mar 21, 1961 (60 y.o. [name] Primary Care Otto Felkins: Billey Gosling Other Clinician: Referring Janyah Singleterry: Treating Chabely Norby/Extender: Landis Martins Weeks in Treatment: 14 Wound Status Wound Number: 3 Primary Diabetic Wound/Ulcer of the Lower Extremity Etiology: Wound Location: Left, Plantar Foot Wound Open Wounding Event: Gradually Appeared Status: Date Acquired: 03/18/2021 Comorbid Anemia, Sleep Apnea, Congestive Heart Failure, Hypertension, Weeks Of Treatment: 14 History: Peripheral Venous Disease, Cirrhosis , Type II Diabetes, Clustered Wound: No Osteoarthritis, Neuropathy Photos Wound Measurements Length: (cm) 4.2 Width: (cm) 3.7 Depth: (cm) 0.2 Area: (cm) 12.205 Volume: (cm) 2.441 % Reduction in Area: -72.7% % Reduction in Volume: -245.3% Epithelialization: Small (1-33%) Tunneling: No Undermining: Yes Location 1 Starting Position (o'clock): 4 Ending Position (o'clock): 8 Maximum Distance: (cm) 0.2 Location 2 Starting Position (o'clock): 9 Ending Position (o'clock): 2 Maximum Distance: (cm) 0.5 Wound Description Classification: Grade 3 Wound Margin: Thickened Exudate Amount: Large Exudate Type: Serosanguineous Exudate Color: red, brown Wound Bed Granulation Amount: Large (67-100%) Granulation Quality: Red, Hyper-granulation Necrotic  Amount: Small (1-33%) Necrotic Quality: Adherent Slough Foul Odor After Cleansing: Yes Due to Product Use: No Slough/Fibrino Yes Exposed Structure Fascia Exposed: No Fat Layer (Subcutaneous Tissue) Exposed: Yes Tendon Exposed: No Muscle Exposed: No Joint Exposed: No Bone Exposed: No Assessment Notes callous periwound with maceration present. Treatment Notes Wound #3 (Foot) Wound Laterality: Plantar, Left Cleanser Soap and Water Discharge Instruction: May shower and wash wound with dial antibacterial soap and water prior to dressing change. Wound Cleanser Discharge Instruction: Cleanse the wound with wound cleanser prior to applying a clean dressing using gauze sponges, not tissue or cotton balls. Peri-Wound Care Zinc Oxide Ointment 30g tube Discharge Instruction: Apply Zinc Oxide as needed for any maceration or redness to periwound with each dressing change Sween Lotion (Moisturizing lotion) Discharge Instruction: Apply moisturizing lotion as directed Topical Primary Dressing KerraCel Ag Gelling Fiber Dressing, 4x5 in (silver alginate) Discharge Instruction: Apply silver alginate to wound bed as instructed Secondary Dressing ABD Pad, 5x9 Discharge Instruction: Apply over primary dressing as directed. Zetuvit Plus 4x8 in Discharge Instruction: Apply over primary dressing as directed. Secured With [employer], 4.5x3.1 (in/yd) Discharge Instruction: Secure with Kerlix as directed. 53M Medipore H Soft Cloth Surgical T ape, 2x2 (in/yd) Discharge Instruction: Secure dressing with tape as directed. Compression Wrap Compression Stockings Add-Ons Electronic Signature(s) Signed: 09/16/2021 5:54:55 PM By: Deon Pilling RN, BSN Entered By: Deon Pilling on 09/16/2021 10:37:17 -------------------------------------------------------------------------------- Vitals Details Patient Name: Date of Service: [name] IG 09/16/2021 9:45 A M Medical Record Number:  [id number] Patient Account Number: [id number] Date of Birth/Sex: Treating RN: [birth year] (60 y.o. [name] Primary Care Treyshon Buchanon: Billey Gosling Other Clinician: Referring Bobby Barton: Treating  Marella Vanderpol/Extender: Landis Martins Weeks in Treatment: 14 Vital Signs Time Taken: 10:20 Temperature (F): 98 Weight (lbs): 270 Pulse (bpm): 74 Respiratory Rate (breaths/min): 20 Blood Pressure (mmHg): 153/72 Reference Range: 80 - 120 mg / dl Electronic Signature(s) Signed: 09/16/2021 5:54:55 PM By: Deon Pilling RN, BSN Entered By: Deon Pilling on 09/16/2021 10:26:20

## 2021-09-21 ENCOUNTER — Other Ambulatory Visit: Payer: Self-pay

## 2021-09-21 ENCOUNTER — Encounter: Payer: Self-pay | Admitting: Infectious Disease

## 2021-09-21 ENCOUNTER — Ambulatory Visit (INDEPENDENT_AMBULATORY_CARE_PROVIDER_SITE_OTHER): Payer: Medicare Other | Admitting: Infectious Disease

## 2021-09-21 VITALS — BP 126/76 | HR 96 | Temp 97.5°F | Wt 251.0 lb

## 2021-09-21 DIAGNOSIS — I890 Lymphedema, not elsewhere classified: Secondary | ICD-10-CM | POA: Diagnosis not present

## 2021-09-21 DIAGNOSIS — K760 Fatty (change of) liver, not elsewhere classified: Secondary | ICD-10-CM | POA: Diagnosis not present

## 2021-09-21 DIAGNOSIS — M87052 Idiopathic aseptic necrosis of left femur: Secondary | ICD-10-CM

## 2021-09-21 DIAGNOSIS — D696 Thrombocytopenia, unspecified: Secondary | ICD-10-CM | POA: Diagnosis not present

## 2021-09-21 DIAGNOSIS — I5033 Acute on chronic diastolic (congestive) heart failure: Secondary | ICD-10-CM | POA: Diagnosis not present

## 2021-09-21 DIAGNOSIS — K7030 Alcoholic cirrhosis of liver without ascites: Secondary | ICD-10-CM | POA: Diagnosis not present

## 2021-09-21 DIAGNOSIS — M87051 Idiopathic aseptic necrosis of right femur: Secondary | ICD-10-CM | POA: Diagnosis not present

## 2021-09-21 DIAGNOSIS — G621 Alcoholic polyneuropathy: Secondary | ICD-10-CM | POA: Diagnosis not present

## 2021-09-21 DIAGNOSIS — I878 Other specified disorders of veins: Secondary | ICD-10-CM | POA: Diagnosis not present

## 2021-09-21 DIAGNOSIS — E11628 Type 2 diabetes mellitus with other skin complications: Secondary | ICD-10-CM

## 2021-09-21 DIAGNOSIS — M86171 Other acute osteomyelitis, right ankle and foot: Secondary | ICD-10-CM

## 2021-09-21 DIAGNOSIS — F1020 Alcohol dependence, uncomplicated: Secondary | ICD-10-CM | POA: Diagnosis not present

## 2021-09-21 DIAGNOSIS — L089 Local infection of the skin and subcutaneous tissue, unspecified: Secondary | ICD-10-CM

## 2021-09-21 MED ORDER — AMOXICILLIN-POT CLAVULANATE 875-125 MG PO TABS: 1.0000 | ORAL_TABLET | Freq: Two times a day (BID) | ORAL | 11 refills | Status: DC

## 2021-09-21 NOTE — Progress Notes (Signed)
Subjective:  Chief complaint: foot pain   Patient ID: [name], male    DOB: 10-02-1961, 60 y.o.   MRN: [id number]  HPI 60 year old Caucasian man with a history of multiple medical problems including alcoholism peripheral vascular disease neuropathy, possibly due to B12 deficiency, lymphedema diabetes but never with a terribly high A1c now found to have osteomyelitis involving his tibia talar bone cuboid and fifth metatarsal.  Saw him in the hospital and at that time he was completely against a below the knee amputation which we feel strongly he would need for cure.  Absent cultures to guide therapy we placed him on vancomycin and ertapenem and he has been discharged to skilled nursing facility with plans for antibiotics to continue for a few more weeks into August.  I believe August 22nd was his  stop date based on Minh Pham's note.   He told me before he was receiving wound care at the nursing facility and they are performing debridements on the wound and that has been improving.   He did see his orthopedic surgeon with Ortho care in [place] on June 11, 2021.  I have not seen him in follow-up since then.  He was hospitalized to [facility] with alcohol withdrawal In November after he had resumed drinking and then tried to stop abruptly.  My partner Dr. Johnny Bridge saw him and felt that continuing antibiotics was really a futile endeavor. I do not disagree with this in that this patient will not cure his osteomyelitis with protracted po abx --though possibly they might prevent more fulminant and rapid spread of the infection hematogenously.  On that note the patient has been noticing back pain over the last month with radicular symptoms which is concerning to me that he may have seeded his spine.  When he left the hospital he actually did go back to taking antibiotics but was initially taking doxycycline and now taking Augmentin.  He is still going to the wound care center but  no longer doing hyperbaric oxygen he is going to see Dr. Erlinda Hong here locally for ration of his back and his foot wound.  I think this is much more prudent than trying to continue to follow with Ortho care in [place].    Past Medical History:  Diagnosis Date   Alcohol dependence ([place])    Arthritis    hips, hands   Bilateral carpal tunnel syndrome    Bilateral leg edema    Chronic   CHF (congestive heart failure) (HCC)    Diabetes mellitus without complication (HCC)    type 2   Diverticulitis    portion of colon removed   DOE (dyspnea on exertion)    occ   Elevated liver enzymes    Fatty liver    GERD (gastroesophageal reflux disease)    occ   Hammer toe    Hip pain    History of ventral hernia repair 2016   x2   Hyperlipidemia    pt unsure   Hypertension    Marijuana abuse    Morbid obesity ([place])    Neuromuscular disorder ([place])    peripheral neuropathy feet and few fingers   OSA (obstructive sleep apnea)    has OSA-not used CPAP 2-3 yrs could not tolerate cpap   PONV (postoperative nausea and vomiting)    Toe ulcer ([place])    left healed    Past Surgical History:  Procedure Laterality Date   AMPUTATION TOE Left 05/25/2018   Procedure: AMPUTATION  TOE left 3rd;  Surgeon: Wylene Simmer, MD;  Location: [facility];  Service: Orthopedics;  Laterality: Left;  44mn, to follow   AMPUTATION TOE Left 06/28/2018   Procedure: Left foot revision 3rd toe amputation including 3rd metatarsal;  Surgeon: HWylene Simmer MD;  [facility]  Service: Orthopedics;  Laterality: Left;  634m   BICEPS TENDON REPAIR Left 2014   Partial   COLONOSCOPY     GRAFT APPLICATION Right 02/23/61/8366 Procedure: GRAFT APPLICATION;  Surgeon: StLandis MartinsDPM;  [facility] Service: Podiatry;  Laterality: Right;   HERNIA REPAIR  2016   ventral   HIP CLOSED REDUCTION Right 09/01/2018   Procedure: CLOSED REDUCTION HIP;  Surgeon: RoNicholes StairsMD;  [facility];  Service: Orthopedics;  Laterality: Right;   INCISION AND DRAINAGE OF WOUND Right 03/03/2020   Procedure: IRRIGATION AND DEBRIDEMENT WOUND;  Surgeon: StLandis MartinsDPM;  [facility] Service: Podiatry;  Laterality: Right;   JOINT REPLACEMENT     b/l knees    METATARSAL HEAD EXCISION Right 03/03/2020   Procedure: IRRIGATION OF TOE AND CAUTERIZATION OF BLEEDING TOE;  Surgeon: StLandis MartinsDPM;  [facility] Service: Podiatry;  Laterality: Right;   METATARSAL HEAD EXCISION Right 03/03/2020   Procedure: METATARSAL HEAD EXCISION SECOND TOE RIGHT;  Surgeon: StLandis MartinsDPM;  [facility] Service: Podiatry;  Laterality: Right;  MAC WITH LOCAL   TOE AMPUTATION Right 09/2013   TOTAL HIP ARTHROPLASTY Right 08/16/2018   Procedure: TOTAL HIP ARTHROPLASTY ANTERIOR APPROACH;  Surgeon: SwRod CanMD;  [facility];  Service: Orthopedics;  Laterality: Right;   TOTAL HIP ARTHROPLASTY Left 11/02/2018   Procedure: TOTAL HIP ARTHROPLASTY ANTERIOR APPROACH;  Surgeon: SwRod CanMD;  [facility];  Service: Orthopedics;  Laterality: Left;   TOTAL KNEE ARTHROPLASTY     bilat    Family History  Adopted: Yes  Family history unknown: Yes      Social History   Socioeconomic History   Marital status: Divorced    Spouse name: Not on file   Number of children: Not on file   Years of education: Not on file   Highest education level: Not on file  Occupational History   Occupation: unemployed, filing for disability  Tobacco Use   Smoking status: Former    Packs/day: 0.25    Years: 10.00    Pack years: 2.50    Types: Cigarettes   Smokeless tobacco: Never   Tobacco comments:    quit 2018  Vaping Use   Vaping Use: Never used  Substance and Sexual Activity   Alcohol use: Not Currently    Comment: 2-3 drinks 4-5 nights a week, liquor   Drug use: Yes    Frequency: 1.0 times per week    Types:  Marijuana    Comment: "4 times a month, maybe"   Sexual activity: Not on file  Other Topics Concern   Not on file  Social History Narrative   Not on file   Social Determinants of Health   Financial Resource Strain: Not on file  Food Insecurity: No Food Insecurity   Worried About RuCharity fundraisern the Last Year: Never true   [place] the Last Year: Never true  Transportation Needs: No Transportation Needs   Lack of Transportation (Medical): No   Lack of Transportation (Non-Medical): No  Physical Activity:  Not on file  Stress: Not on file  Social Connections: Not on file    Allergies  Allergen Reactions   Claritin [Loratadine] Shortness Of Breath and Anxiety     Current Outpatient Medications:    acamprosate (CAMPRAL) 333 MG tablet, Take 333 mg by mouth 2 (two) times daily., Disp: , Rfl:    amLODipine (NORVASC) 5 MG tablet, Take 1 tablet (5 mg total) by mouth daily., Disp: 30 tablet, Rfl: 0   amoxicillin-clavulanate (AUGMENTIN) 875-125 MG tablet, Take 1 tablet by mouth 2 (two) times daily., Disp: 60 tablet, Rfl: 11   folic acid (FOLVITE) 1 MG tablet, Take 1 tablet (1 mg total) by mouth daily., Disp: 90 tablet, Rfl: 0   furosemide (LASIX) 40 MG tablet, TAKE 1 TABLET BY MOUTH TWICE A DAY (Patient taking differently: Take 80 mg by mouth 2 (two) times daily.), Disp: 180 tablet, Rfl: 1   gabapentin (NEURONTIN) 300 MG capsule, Take 1 capsule (300 mg total) by mouth 2 (two) times daily., Disp: 90 capsule, Rfl: 3   HEMP OIL-VANILLYL BUTYL ETHER EX, Apply 1 application topically 2 (two) times daily as needed (arthritis pain)., Disp: , Rfl:    hydrOXYzine (ATARAX/VISTARIL) 25 MG tablet, Take 25 mg by mouth 2 (two) times daily., Disp: , Rfl:    levothyroxine (SYNTHROID) 50 MCG tablet, Take 50 mcg by mouth daily before breakfast., Disp: , Rfl:    LORazepam (ATIVAN) 1 MG tablet, Take 1 mg by mouth daily as needed for anxiety., Disp: , Rfl:    meloxicam (MOBIC) 15 MG tablet,  Take 1 tablet (15 mg total) by mouth daily. Take with food, Disp: 30 tablet, Rfl: 0   metoprolol tartrate (LOPRESSOR) 25 MG tablet, Take 1 tablet (25 mg total) by mouth 2 (two) times daily., Disp: 60 tablet, Rfl: 0   Multiple Vitamin (MULTIVITAMIN WITH MINERALS) TABS tablet, Take 1 tablet by mouth daily., Disp: 90 tablet, Rfl: 0   oxyCODONE (ROXICODONE) 5 MG immediate release tablet, Take 1 tablet (5 mg total) by mouth every 8 (eight) hours as needed (Chronic back pain)., Disp: 25 tablet, Rfl: 0   potassium chloride SA (KLOR-CON M20) 20 MEQ tablet, Take 3 tablets (60 mEq total) by mouth 2 (two) times daily., Disp: 180 tablet, Rfl: 0   saccharomyces boulardii (FLORASTOR) 250 MG capsule, Take 1 capsule (250 mg total) by mouth 2 (two) times daily., Disp: 60 capsule, Rfl: 0   thiamine 100 MG tablet, Take 1 tablet (100 mg total) by mouth daily., Disp: 90 tablet, Rfl: 0   traZODone (DESYREL) 50 MG tablet, TAKE 1-2 TABLETS (50-100 MG TOTAL) BY MOUTH AT BEDTIME AS NEEDED FOR SLEEP. (Patient taking differently: Take 50-100 mg by mouth See admin instructions. Take 30m to 1066mby mouth at bedtime as needed for sleep), Disp: 180 tablet, Rfl: 1   vitamin B-12 (CYANOCOBALAMIN) 100 MCG tablet, Take 100 mcg by mouth daily., Disp: , Rfl:    Review of Systems  Constitutional:  Negative for activity change, appetite change, chills, diaphoresis, fatigue, fever and unexpected weight change.  HENT:  Negative for congestion, rhinorrhea, sinus pressure, sneezing, sore throat and trouble swallowing.   Eyes:  Negative for photophobia and visual disturbance.  Respiratory:  Negative for cough, chest tightness, shortness of breath, wheezing and stridor.   Cardiovascular:  Negative for chest pain, palpitations and leg swelling.  Gastrointestinal:  Negative for abdominal distention, abdominal pain, anal bleeding, blood in stool, constipation, diarrhea, nausea and vomiting.  Genitourinary:  Negative for difficulty urinating,  dysuria, flank pain and hematuria.  Musculoskeletal:  Positive for arthralgias and back pain. Negative for gait problem, joint swelling and myalgias.  Skin:  Positive for wound. Negative for color change, pallor and rash.  Neurological:  Negative for dizziness, tremors, weakness and light-headedness.  Hematological:  Negative for adenopathy. Does not bruise/bleed easily.  Psychiatric/Behavioral:  Negative for agitation, behavioral problems, confusion, decreased concentration, dysphoric mood and sleep disturbance.       Objective:   Physical Exam Constitutional:      Appearance: He is well-developed.  HENT:     Head: Normocephalic and atraumatic.  Eyes:     Conjunctiva/sclera: Conjunctivae normal.  Cardiovascular:     Rate and Rhythm: Normal rate and regular rhythm.  Pulmonary:     Effort: Pulmonary effort is normal. No respiratory distress.     Breath sounds: No wheezing.  Abdominal:     General: There is no distension.     Palpations: Abdomen is soft.  Musculoskeletal:        General: No tenderness. Normal range of motion.     Cervical back: Normal range of motion and neck supple.  Skin:    General: Skin is warm and dry.     Coloration: Skin is not pale.     Findings: No erythema or rash.  Neurological:     General: No focal deficit present.     Mental Status: He is alert and oriented to person, place, and time.  Psychiatric:        Mood and Affect: Mood normal.        Behavior: Behavior normal.        Thought Content: Thought content normal.        Judgment: Judgment normal.    Wound as pictured in photo taken by his girlfriend 06/05/2021:    Wound 09/21/2021:       Assessment & Plan:   Osteomyelitis involving ankle including the talar bones cuboid bones and fifth metatarsal:  I will check sed rate CRP CMP and CBC with differential  I will continue Augmentin alone.  He is going to see Dr. Erlinda Hong tomorrow  New Low back pain with radicular symptoms: This is  highly concerning for potential pathology in the lumbar spine and in his case I would worry about hematogenous spread of infection to the discs and bones of the back.  I would favor getting an MRI with and without contrast.  He is going to be seeing Dr. Erlinda Hong tomorrow and I will await his input prior to ordering one  Osteomyelitis involving the ankle including the talar bones cuboid cuboid bones and fifth metatarsal   He is also going to be seeing Dr. Dellia Nims with wound care and going to try hyperbaric oxygen therapy.  Alcoholic neuropathy: This is the reason for his poor sensation rather than diabetes  Venous stasis changes: This is been chronic   ? Cirrhosis: Given his history of heavy alcohol use and his low platelets I would suspect he has cirrhosis of the liver.  I do not see imaging to confirm this

## 2021-09-22 ENCOUNTER — Encounter: Payer: Self-pay | Admitting: Orthopaedic Surgery

## 2021-09-22 ENCOUNTER — Ambulatory Visit (INDEPENDENT_AMBULATORY_CARE_PROVIDER_SITE_OTHER): Payer: Medicare Other | Admitting: Orthopaedic Surgery

## 2021-09-22 ENCOUNTER — Encounter: Payer: Self-pay | Admitting: Internal Medicine

## 2021-09-22 DIAGNOSIS — M5416 Radiculopathy, lumbar region: Secondary | ICD-10-CM | POA: Diagnosis not present

## 2021-09-22 LAB — CBC WITH DIFFERENTIAL/PLATELET
Absolute Monocytes: 559 cells/uL (ref 200–950)
Basophils Absolute: 52 cells/uL (ref 0–200)
Basophils Relative: 0.8 %
Eosinophils Absolute: 91 cells/uL (ref 15–500)
Eosinophils Relative: 1.4 %
HCT: 40.8 % (ref 38.5–50.0)
Hemoglobin: 13.7 g/dL (ref 13.2–17.1)
Lymphs Abs: 1580 cells/uL (ref 850–3900)
MCH: 29.9 pg (ref 27.0–33.0)
MCHC: 33.6 g/dL (ref 32.0–36.0)
MCV: 89.1 fL (ref 80.0–100.0)
MPV: 9.6 fL (ref 7.5–12.5)
Monocytes Relative: 8.6 %
Neutro Abs: 4219 cells/uL (ref 1500–7800)
Neutrophils Relative %: 64.9 %
Platelets: 330 10*3/uL (ref 140–400)
RBC: 4.58 10*6/uL (ref 4.20–5.80)
RDW: 14.1 % (ref 11.0–15.0)
Total Lymphocyte: 24.3 %
WBC: 6.5 10*3/uL (ref 3.8–10.8)

## 2021-09-22 LAB — COMPLETE METABOLIC PANEL WITH GFR
AG Ratio: 1.1 (calc) (ref 1.0–2.5)
ALT: 26 U/L (ref 9–46)
AST: 31 U/L (ref 10–35)
Albumin: 3.8 g/dL (ref 3.6–5.1)
Alkaline phosphatase (APISO): 211 U/L — ABNORMAL HIGH (ref 35–144)
BUN: 18 mg/dL (ref 7–25)
CO2: 25 mmol/L (ref 20–32)
Calcium: 9.9 mg/dL (ref 8.6–10.3)
Chloride: 104 mmol/L (ref 98–110)
Creat: 1.18 mg/dL (ref 0.70–1.35)
Globulin: 3.6 g/dL (calc) (ref 1.9–3.7)
Glucose, Bld: 121 mg/dL — ABNORMAL HIGH (ref 65–99)
Potassium: 4.1 mmol/L (ref 3.5–5.3)
Sodium: 140 mmol/L (ref 135–146)
Total Bilirubin: 1.2 mg/dL (ref 0.2–1.2)
Total Protein: 7.4 g/dL (ref 6.1–8.1)
eGFR: 71 mL/min/{1.73_m2} (ref >=60)

## 2021-09-22 LAB — C-REACTIVE PROTEIN: CRP: 10 mg/L — ABNORMAL HIGH (ref <8.0)

## 2021-09-22 LAB — SEDIMENTATION RATE: Sed Rate: 50 mm/h — ABNORMAL HIGH (ref 0–20)

## 2021-09-22 MED ORDER — METHOCARBAMOL 500 MG PO TABS: 500.0000 mg | ORAL_TABLET | Freq: Two times a day (BID) | ORAL | 0 refills | Status: DC | PRN

## 2021-09-22 MED ORDER — DICLOFENAC SODIUM 75 MG PO TBEC: 75.0000 mg | DELAYED_RELEASE_TABLET | Freq: Two times a day (BID) | ORAL | 2 refills | Status: DC | PRN

## 2021-09-22 NOTE — Progress Notes (Signed)
Office Visit Note   Patient: [name]           Date of Birth: Oct 05, 1961           MRN: [id number] Visit Date: 09/22/2021              Requested by: Binnie Rail, MD [place],  [place] [id number] PCP: Binnie Rail, MD   Assessment & Plan: Visit Diagnoses:  1. Lumbar radiculopathy     Plan: Impression is chronic low back pain with right lower extremity radiculopathy.  At this point, we have discussed starting the patient on a steroid, but I am not comfortable doing so I see has an open left foot wound.  Instead, we will try a course of NSAIDs and muscle relaxers.  He is currently getting home health physical therapy so we will add exercises for his back to this current regimen.  If his symptoms have not improved over the next several weeks, he will follow-up for repeat evaluation.  Otherwise, follow-up with [place] as needed.  Follow-Up Instructions: Return if symptoms worsen or fail to improve.   Orders:  No orders of the defined types were placed in this encounter.  Meds ordered this encounter  Medications   diclofenac (VOLTAREN) 75 MG EC tablet    Sig: Take 1 tablet (75 mg total) by mouth 2 (two) times daily as needed.    Dispense:  60 tablet    Refill:  2   methocarbamol (ROBAXIN) 500 MG tablet    Sig: Take 1 tablet (500 mg total) by mouth 2 (two) times daily as needed.    Dispense:  20 tablet    Refill:  0      Procedures: No procedures performed   Clinical Data: No additional findings.   Subjective: Chief Complaint  Patient presents with   Lower Back - Pain    HPI patient is a pleasant 60 year old gentleman who comes in today with low back pain for the past year.  The pain he has radiates from the left to the right and down both thighs.  He has had this pain increased about 3 weeks ago after falling backwards.  X-rays were obtained at that point of the thoracic and lumbar spines which were negative for fracture.  He complains of a throbbing  pain worse with standing greater than 5 minutes as well as twisting at the hips.  He denies any paresthesias to either lower extremity.  Of note, he was recently hospitalized for alcohol withdrawal and it was noted that he had a chronically infected left foot wound for which she is on Augmentin and doxycycline.  Review of Systems as detailed in HPI.  All others reviewed and are negative.   Objective: Vital Signs: There were no vitals taken for this visit.  Physical Exam well-developed well-nourished gentleman in no acute distress.  Alert and oriented x3.  Ortho Exam lumbar spine exam shows no spinous or paraspinous tenderness.  No pain with lumbar flexion or extension.  Negative straight leg raise both sides.  He is neurovascularly intact distally.  Specialty Comments:  No specialty comments available.  Imaging: No new imaging   PMFS History: Patient Active Problem List   Diagnosis Date Noted   Chronic bilateral low back pain without sciatica 09/09/2021   Type 2 diabetes mellitus with foot ulcer ([place]) 09/02/2021   Hypokalemia 09/02/2021   Hypomagnesemia 09/02/2021   Thrombocytopenia ([name]) 09/02/2021   Elevated LFTs 09/02/2021   Alcohol  dependence with withdrawal ([place]) 09/01/2021   Critical lower limb ischemia ([place]) 05/29/2021   Alcohol abuse 04/28/2021   Diabetic foot ulcer ([place]) 04/28/2021   Severe sepsis ([place]) 04/28/2021   Osteomyelitis ([place]) 04/27/2021   Anxiety 03/17/2021   Amputated toe of right foot ([place]) 59/16/3846   Alcoholic peripheral neuropathy ([place]) 10/28/2019   Chronic diastolic CHF (congestive heart failure) ([place]) 10/15/2019   Chronic foot ulcer ([place]) 65/99/3570   Alcoholic cirrhosis of liver without ascites ([place]) 04/27/2019   Obesity hypoventilation syndrome ([place]) 04/27/2019   Depression 04/25/2019   Severe alcohol use disorder ([place])    DOE (dyspnea on exertion) 04/22/2019   Hypothyroidism 04/22/2019   Anemia 02/06/2019   Acute on chronic diastolic CHF  (congestive heart failure) ([place]) 01/30/2019   Avascular necrosis of hip, left ([place]) 11/02/2018   Decreased hearing of both ears 10/30/2018   Avascular necrosis of hip, right ([place]) 08/16/2018   Bilateral leg edema 02/27/2018   Marijuana abuse 02/16/2018   Ulcer of left heel ([place]) 12/21/2016   Bilateral carpal tunnel syndrome 12/15/2016   Hyperlipidemia 11/25/2016   Prediabetes 11/25/2016   Hypertension 11/23/2016   History of osteomyelitis 11/23/2016   Morbid obesity ([place]) 11/23/2016   OSA (obstructive sleep apnea) 11/23/2016   Other hammer toe (acquired) 11/11/2013   Exstrophy of bladder 11/11/2013   Past Medical History:  Diagnosis Date   Alcohol dependence (HCC)    Arthritis    hips, hands   Bilateral carpal tunnel syndrome    Bilateral leg edema    Chronic   CHF (congestive heart failure) ([place])    Diabetes mellitus without complication ([place])    type 2   Diverticulitis    portion of colon removed   DOE (dyspnea on exertion)    occ   Elevated liver enzymes    Fatty liver    GERD (gastroesophageal reflux disease)    occ   Hammer toe    Hip pain    History of ventral hernia repair 2016   x2   Hyperlipidemia    pt unsure   Hypertension    Marijuana abuse    Morbid obesity ([place])    Neuromuscular disorder ([place])    peripheral neuropathy feet and few fingers   OSA (obstructive sleep apnea)    has OSA-not used CPAP 2-3 yrs could not tolerate cpap   PONV (postoperative nausea and vomiting)    Toe ulcer ([place])    left healed    Family History  Adopted: Yes  Family history unknown: Yes    Past Surgical History:  Procedure Laterality Date   AMPUTATION TOE Left 05/25/2018   Procedure: AMPUTATION TOE left 3rd;  Surgeon: Wylene Simmer, MD;  Location: [facility];  Service: Orthopedics;  Laterality: Left;  37mn, to follow   AMPUTATION TOE Left 06/28/2018   Procedure: Left foot revision 3rd toe amputation including 3rd metatarsal;  Surgeon: HWylene Simmer MD;   [facility]  Service: Orthopedics;  Laterality: Left;  689m   BICEPS TENDON REPAIR Left 2014   Partial   COLONOSCOPY     GRAFT APPLICATION Right 02/16/76/9390 Procedure: GRAFT APPLICATION;  Surgeon: StLandis MartinsDPM;  [facility] Service: Podiatry;  Laterality: Right;   HERNIA REPAIR  2016   ventral   HIP CLOSED REDUCTION Right 09/01/2018   Procedure: CLOSED REDUCTION HIP;  Surgeon: RoNicholes StairsMD;  [facility];  Service: Orthopedics;  Laterality: Right;   INCISION AND  DRAINAGE OF WOUND Right 03/03/2020   Procedure: IRRIGATION AND DEBRIDEMENT WOUND;  Surgeon: Landis Martins, DPM;  Location: [facility];  Service: Podiatry;  Laterality: Right;   JOINT REPLACEMENT     b/l knees    METATARSAL HEAD EXCISION Right 03/03/2020   Procedure: IRRIGATION OF TOE AND CAUTERIZATION OF BLEEDING TOE;  Surgeon: Landis Martins, DPM;  Location: [facility];  Service: Podiatry;  Laterality: Right;   METATARSAL HEAD EXCISION Right 03/03/2020   Procedure: METATARSAL HEAD EXCISION SECOND TOE RIGHT;  Surgeon: Landis Martins, DPM;  Location: [facility];  Service: Podiatry;  Laterality: Right;  MAC WITH LOCAL   TOE AMPUTATION Right 09/2013   TOTAL HIP ARTHROPLASTY Right 08/16/2018   Procedure: TOTAL HIP ARTHROPLASTY ANTERIOR APPROACH;  Surgeon: Rod Can, MD;  [facility];  Service: Orthopedics;  Laterality: Right;   TOTAL HIP ARTHROPLASTY Left 11/02/2018   Procedure: TOTAL HIP ARTHROPLASTY ANTERIOR APPROACH;  Surgeon: Rod Can, MD;  [facility];  Service: Orthopedics;  Laterality: Left;   TOTAL KNEE ARTHROPLASTY     bilat   Social History   Occupational History   Occupation: unemployed, Hydrographic surveyor for disability  Tobacco Use   Smoking status: Former    Packs/day: 0.25    Years: 10.00    Pack years: 2.50    Types: Cigarettes   Smokeless tobacco: Never   Tobacco comments:    quit 2018  Vaping  Use   Vaping Use: Never used  Substance and Sexual Activity   Alcohol use: Not Currently    Comment: 2-3 drinks 4-5 nights a week, liquor   Drug use: Yes    Frequency: 1.0 times per week    Types: Marijuana    Comment: "4 times a month, maybe"   Sexual activity: Not on file

## 2021-09-23 ENCOUNTER — Encounter (HOSPITAL_BASED_OUTPATIENT_CLINIC_OR_DEPARTMENT_OTHER): Payer: Medicare Other | Attending: Physician Assistant | Admitting: Physician Assistant

## 2021-09-23 ENCOUNTER — Other Ambulatory Visit: Payer: Self-pay

## 2021-09-23 DIAGNOSIS — E11621 Type 2 diabetes mellitus with foot ulcer: Secondary | ICD-10-CM | POA: Insufficient documentation

## 2021-09-23 DIAGNOSIS — E1169 Type 2 diabetes mellitus with other specified complication: Secondary | ICD-10-CM | POA: Insufficient documentation

## 2021-09-23 DIAGNOSIS — K7030 Alcoholic cirrhosis of liver without ascites: Secondary | ICD-10-CM | POA: Insufficient documentation

## 2021-09-23 DIAGNOSIS — M86372 Chronic multifocal osteomyelitis, left ankle and foot: Secondary | ICD-10-CM | POA: Insufficient documentation

## 2021-09-23 DIAGNOSIS — Z96651 Presence of right artificial knee joint: Secondary | ICD-10-CM | POA: Insufficient documentation

## 2021-09-23 DIAGNOSIS — I872 Venous insufficiency (chronic) (peripheral): Secondary | ICD-10-CM | POA: Insufficient documentation

## 2021-09-23 DIAGNOSIS — F10239 Alcohol dependence with withdrawal, unspecified: Secondary | ICD-10-CM | POA: Diagnosis not present

## 2021-09-23 DIAGNOSIS — Z96642 Presence of left artificial hip joint: Secondary | ICD-10-CM | POA: Insufficient documentation

## 2021-09-23 DIAGNOSIS — I5032 Chronic diastolic (congestive) heart failure: Secondary | ICD-10-CM | POA: Insufficient documentation

## 2021-09-23 DIAGNOSIS — E11610 Type 2 diabetes mellitus with diabetic neuropathic arthropathy: Secondary | ICD-10-CM | POA: Insufficient documentation

## 2021-09-23 DIAGNOSIS — I110 Hypertensive heart disease with heart failure: Secondary | ICD-10-CM | POA: Diagnosis not present

## 2021-09-23 DIAGNOSIS — L97528 Non-pressure chronic ulcer of other part of left foot with other specified severity: Secondary | ICD-10-CM | POA: Insufficient documentation

## 2021-09-23 NOTE — Progress Notes (Addendum)
[name], [name] ([id number]) Visit Report for 09/23/2021 Chief Complaint Document Details Patient Name: Date of Service: [name], [name] 09/23/2021 12:30 PM Medical Record Number: [id number] Patient Account Number: [id number] Date of Birth/Sex: Treating RN: 22-Mar-1961 (60 y.o. [name] Primary Care Provider: Billey Gosling Other Clinician: Referring Provider: Treating Provider/Extender: Adele Schilder in Treatment: 15 Information Obtained from: Patient Chief Complaint Bilateral foot diabetic ulcers Electronic Signature(s) Signed: 09/23/2021 12:51:36 PM By: Worthy Keeler PA-C Entered By: Worthy Keeler on 09/23/2021 12:51:35 -------------------------------------------------------------------------------- HPI Details Patient Name: Date of Service: [name], [name] IG 09/23/2021 12:30 PM Medical Record Number: [id number] Patient Account Number: [id number] Date of Birth/Sex: Treating RN: 1961/03/09 (60 y.o. [name] Primary Care Provider: Billey Gosling Other Clinician: Referring Provider: Treating Provider/Extender: Adele Schilder in Treatment: 15 History of Present Illness HPI Description: 10/31/2019 upon evaluation today patient appears to be doing somewhat poorly in regard to his bilateral plantar feet. He has wounds that he tells me have been present since 2012 intermittently off and on. Most recently this has been open for at least the past 6 months to a year. He has been trying to treat this in different ways using Santyl along with various other dressings including Medihoney and even at one point Xeroform. Nothing really has seem to get this completely closed. He was recently in the hospital for cellulitis of his leg subsequently he did have x-rays as well as MRIs that showed negative for any signs of osteomyelitis in regard to the wounds on his feet. Fortunately there is no signs of systemic infection at this time. No fevers, chills,  nausea, vomiting, or diarrhea. Patient has previously used Darco offloading shoes as far as frontal floaters as well as postop surgical shoes. He has never been in a total contact cast that may be something we need to strongly consider here. Patient's most recent hemoglobin A1c 1 month ago was 5.3 seems to be very well controlled which is great. Subsequently he has seen vascular as well as podiatry. His ABIs are 1.07 on the left and 1.14 on the right he seems to be doing well he does have chronic venous stasis. 11/07/2019 upon evaluation today patient appears to be doing well with regard to his wounds all things considered. I do not see any severe worsening he still has some callus buildup on the right more than the left he notes that he has been probably more active than he should as far as walking is concerned is just very hard to not be active. He knows he needs to be more careful in this regard however. He is willing to give the cast a try at this point although he notes that he is a little nervous about this just with regard to balance although he will be very careful and obviously if he has any trouble he knows to contact the office and let me know. 1/22; patient is in for his obligatory first total contact cast change. Our intake nurse reported a very large amount of drainage which is spelled out over to the surrounding skin. Has bilateral diabetic foot wounds. He has Charcot feet. We have been using silver alginate on his wounds. 11/14/2019 on evaluation today patient is actually seeming to make good improvement in regard to his bilateral plantar foot wounds. We have been using a cast on the left side and on the right side he has been using dressings he is changing up his own accord.  With that being said he tells me that he is also not walking as much just due to how unsteady he feels. He takes it easy when he does have to walk and when he does not have to walk he is resting. This is  probably help in his right foot as well has the left foot which is actually measuring better. In fact both are measuring better. Overall I am very pleased with how things seem to be progressing. The patient does have some odor on the left foot this does have me concerned about the possibility of infection, and actually probably go ahead and put him on antibiotics today as well as utilizing a continuation of the cast on the left foot I think that will be fine we probably just need to bring him in sooner to change this not last a whole week. 1/29; we brought the patient back today for a total contact cast change on the left out of concern for excessive drainage. We are using drawtex over the wound as the primary dressing 11/21/2019 on evaluation today patient appears to be doing well with regard to his left plantar foot. In fact both foot ulcers actually seem to be doing pretty well. Nonetheless he is having a lot of drainage on the left at this time and again we did obtain a wound culture did show positive for Staph aureus that was reviewed by myself today as well. Nonetheless he is on Bactrim which was shown to be sensitive that should be helping in this regard. Fortunately there is no signs of infection systemically at this point. 2/5; back in clinic today for a total contact cast change apparently secondary to very significant drainage. Still using drawtex 11/28/2019 upon evaluation today patient appears to be doing well with regard to his wounds. The right foot is doing okay as measured about the same in my opinion. The left foot is actually showing signs of significant improvement is measuring smaller there is a lot of hyper granulation likely due to the continued drainage at this point. We did obtain approval for a snap VAC I think that is good to be appropriate for him and will likely help this tremendously underneath the cast. He is definitely in agreement with proceeding with such. 2/12; patient  came in today after his snap VAC lost suction. Brought in to see one of our nurses. The dressing was replaced and then we put the cast back on and rehooked up the snap VAC. Apparently his wound looked very good per our intake nurse. 2/15; again we replaced the cast on Friday. By Saturday the snap VAC and light suction. He called this morning he comes in acutely. The wounds look fine however the VAC is not functioning. We replaced the cast using silver alginate as the primary dressing backed with Kerramax. The snap VAC was not replaced 12/05/2019 upon evaluation today patient appears to be doing better in regard to his left plantar foot ulcer. Fortunately there is no signs of active infection at this time. Unfortunately he is continuing to have issues with the right foot he is really not making any progress here things seem to be somewhat stagnant to be honest. The depth has increased but that is due to me having debrided the wound in the past based on what I am seeing. 12/12/2019 on evaluation today patient appears to be doing more poorly in regard to the left lower extremity. He has some erythema spreading up the side of his foot  I am concerned about infection again at this point. Unfortunately he has been seeing improvement with a total contact cast but I do not think we should put that on today. On his right plantar foot he continues to have significant drainage this is actually measuring deeper I really do not feel like you are making any progress whatsoever. I have prescribed Granix for him unfortunately his insurance apparently was going to cost him a $500 co-pay. 12/19/2019 upon evaluation today patient actually appears to be doing better in regard to both wounds. With that being said he actually did get the reGranix which he had to pay $500 for. With that being said it does look like that he is actually made some improvement based on what I am seeing at this point with the reGranix. Obviously if he  is going to continue this we are going to do something about trying to get him some help in covering the cost. 12/26/2019 on evaluation today patient appears to be doing really much better even compared to last week. Overall the wound seems to be much better even compared to last week and last week was better than the week before. Since has been using the reGranix his symptoms have improved significantly. With that being said the issue right now is simply that this is a very expensive medication for him the first dose cost him $500. Upon inspection patient's wound bed actually is however dramatically improved compared to before he started this 2 weeks ago. 01/02/2020 upon evaluation today patient appears to actually be doing well. He still had a little bit of reGranix left that has been using in small amounts he just been applying it every other day instead of every day in order to make it go longer. Overall we are still seeing excellent improvement he is measuring smaller looking better healthier tissue and everything seems to be pointing to this headed in the right direction. Fortunately there is no evidence of infection either which is also excellent news. He does have his MRI coming up within the next week. 01/09/2020 upon evaluation today patient appears to be doing a little worse today compared to previous week's evaluation. He is actually been out of the reGranix at this point. He has been trying to make the stretch out so he has been changing the dressings on a regular set schedule like he was previous. I think this has made a difference. Fortunately there is no signs of active infection at this time. No fevers, chills, nausea, vomiting, or diarrhea. 01/16/2020 upon evaluation today patient appears to be doing well with regard to his left plantar foot ulcer. The right plantar foot still shows some significant depth at this point. Fortunately there is no signs of active infection at this  time. 01/30/2020 upon evaluation today patient appears to be doing about the same in regard to his right plantar foot ulcer there is still some depth here and we had to wait till he actually switched over to his new insurance to get approval for the MRI under his new insurance plan. With that being said he now has switched as of April 1. Fortunately there is no signs of active infection at this time. Overall in regard to his left foot ulcer this seems to be doing much better and I am actually very pleased with how things are going. With that being said it is not quite as much progress as we were seeing with the reGranix but at the same time he  has had trouble getting this apparently there is been some hindrance here. I Georgina Peer try to actually send this to [facility] that was recommended by the drug rep to me. 02/13/20 upon evaluation today patient appears to be doing better in regard to his left foot ulcer this is great news. Unfortunately the right foot ulcer is not really significantly better at this time. There is no signs of active infection systemically though he did have his MRI which showed unfortunately he does have infection noted including an abscess in the foot. There is also marrow changes noted which are consistent with osteomyelitis based on the radiology review and interpretation. Unfortunately considering that the wound is not really making the progress that we will he would like to be seen I think that this is an indication that he may need some further referral both infectious disease as well as potentially to podiatrist to see if there is anything that can be done to help with the situation that were dealing with here. The left foot again is doing great. READMISSION 06/04/2021 This is a 60 year old man who was in the clinic in 2021 followed by Jeri Cos for areas on the right and left foot. He developed a left foot infection and was referred to ID. He left the clinic in a  nonhealed state and was followed for a period of [facility] Cannon Kettle. Apparently things really deteriorated in early July when he was admitted to hospital from 04/27/2021 through 05/06/2021 with sepsis secondary to a left foot infection. His blood cultures were negative. An MRI suggested fifth metatarsal osteomyelitis a left ankle septic joint. He was treated with vancomycin and ertapenem which he is still taking and may just about be finishing. He was seen by orthopedics and the patient adamantly refused to BKA. As far as I can tell he did not have the ankle aspirated I am not exactly sure what the issue was here. Since he has been discharged she is at [facility] for rehabilitation. He was last seen by Dr. Drucilla Schmidt on 05/20/2021 he noted osteo of the tibial talar bone cuboid and fifth metatarsal which is even more extensive than what was suggested by the MRI. He is apparently going for a consultation with orthopedic surgery in [place] sometime next week. I received a call about this man 2 weeks ago from Dr. Gwenlyn Found who follows him for the possibility of PAD. ABIs I think done in the office showed a ABI on the right of 1.05 at the PTA and 0.99 at the PTA on the left. He had a DVT rule out in the left leg that was negative for the DVT. Past medical history is extensive and includes diastolic heart failure, right first metatarsal head ulcer in 2021, excision of the right second ray by Dr. Cannon Kettle on 03/13/2020, hypertension, hypothyroidism. Left total hip replacement, right total knee replacement, carpal tunnel syndrome, obstructive sleep apnea alcohol abuse with cirrhosis although the patient denies current alcohol intake. The patient does not think he is a diabetic however looking through [facility] I see 2 HgbAic's of this year that were greater than equal to 6.5 which by definition makes him diabetic. Nevertheless he is not on any treatment and does not check his blood  sugars. The patient is now back home out of the nursing home. Saw Dr. Drucilla Schmidt last week he was taken off vancomycin and ertapenem on August 22 and now is on doxycycline on Augmentin. He also saw  Dr. Weston Anna who is his orthopedic surgeon in [place] he recommended a [employer]	[place]. He has been using Medihoney. 9/6I have been having trouble getting hyperbarics approved through our prior authorization process. Even though he had a limb threatening infection in the left foot and probably the left ankle there glitches in how some of the reports are worded also some of the consultants. In any case I am going to repeat his sedimentation rate and C-reactive protein. I am generally not in favor of doing things like this as they really do not alter the plan of care from my point of view however I am going to need to demonstrate that these remain high in order to get this through forhyperbaric treatment for chronic refractory osteomyelitis 9/13; following this man for a wound on his left plantar foot in the setting of type 2 diabetes and Charcot deformity. He has underlying chronic refractory osteomyelitis. Follow-up sedimentation rate and C-reactive protein were both elevated but the C-reactive protein was down to 1.4, sedimentation rate at 70. Sedimentation rate was only slightly down from previous at 85. His wound is measuring slightly smaller. 9/20; patient started hyperbaric oxygen therapy today and tolerated treatment well. This is for the underlying osteomyelitis. He remains on antibiotics but thinks he is getting close to finishing. The wound on the plantar aspect of his foot is the other issue we are following here. He is using Medihoney The patient has a Charcot foot in the setting of type 2 diabetes. He is going to need a total contact cast although his partner was away this week and we elected to delay this till next week 9/27; patient still tolerating hyperbaric oxygen well. Wound looked generally  healthy not much depth under illumination still 100% covered in fibrinous debris. Raised callused edges around the wound he was prepared for a total contact cast. We have been using Medihoney 9/30; patient is back for his first obligatory total contact cast change. We are using Hydrofera Blue. Noted by our intake nurse to have a lot of drainage or at least a moderate amount of drainage. I am not sure I was previously aware of this 10/4; patient arrives today with a lot of drainage under the cast. When he had it changed last Friday there was also a similar amount of drainage. Her intake nurse says that they tried a wound VAC on him perhaps while I was on vacation in August under the cast but that did not work. In my experience that has not been unusual. We have been using Hydrofera Blue with all the secondary absorbers. The drainage today went right through all of our dressings. The patient is concerned about his foot being in a cast without much drainage. He is tolerating HBO well. There has been improvements in the wound in the mid part of his foot in the setting of a Charcot deformity 10/7; patient presents for cast change. He has no issues or complaints today. He denies signs of infection. 10/11; patient presents for cast change. At this time he would like to take a break from the cast. He would like to do daily dressing changes with Hydrofera Blue. He currently denies signs of infection. 10/14; his cast was taken off last week at his request. He arrives in the clinic with [facility]. He has been changing the dressing himself. He has way too much edema in the left foot and leg to consider a total contact cast. I do not really know the issue here. He  does have chronic venous insufficiency His wife stopped me in the clinic earlier in the week to report he is drinking again and she is concerned. I am uncertain whether there are other issues 10/18; he arrived in clinic last week having bilateral  lower extremity edema likely secondary to chronic venous insufficiency there was too much edema in the left leg to apply a total contact cast. I put him in compression on the left leg to control the swelling. This week he cut the wrap on the foot for reasons that are unclear however today he arrives in clinic with a smaller left leg but massive edema in the left foot. He is supposed to be wearing a juxta lite stocking on the right leg but he is not wearing the contact layer. His attendance at hyperbaric oxygen has been dwindling, he did not dive yesterday and he did not dive today concerned about hyperbarics causing swelling. I looked back in his record his last echo was in 2020 essentially normal left and right ventricular function. Last BUN and creatinine were done in July this was normal. He is having a lot of drainage in the left plantar foot wound 10/25; again he comes in today having missed HBO yesterday. Macerated skin around the wound which really does not look very good at all. A lot of edema in the left foot but an improvement in edema on the left leg. We wrapped him last week because of the amount of swelling in the left leg. We could not apply a total contact cast. The patient states that he wants to be able to change his dressing himself. I might consider this if he had stockings to control the swelling. He said he be here for HBO tomorrow. I will check the degree of erythema in his forefoot which I have marked. He is on doxycycline and Augmentin which was renewed by Dr. Drucilla Schmidt but I cannot see a follow-up note, follow-up inflammatory markers etc. I 1 point he said he was going to see his orthopedic surgeon in [place] I am not sure if he ever did this. I do not know why he has not followed up with infectious disease, he says he was not given an appointment but he is still on Augmentin doxycycline 11/1; he did not do the lab work I ordered last week. Still on Augmentin and doxycycline.  Silver alginate and he is changing this daily using his own juxta lite stocking. The surface of the wound does not look too bad. No real epithelialization however. The patient was seen today along with HBO 08/26/2021 upon evaluation today patient is being seen at his request by myself he wanted to transfer care to see me. With that being said he has been seeing Dr. Dellia Nims since he came back in August of this year. Unfortunately he tells me he still having quite a bit of drainage. He is also significant erythema and warmth noted of the foot as well. He has been hit or miss with regard to hyperbaric oxygen therapy tells me that at this point he took this week off because he was extremely claustrophobic and having a lot of issues he plans to start back next week. Nonetheless he has been given some medication by Dr. Dellia Nims as well to help calm things down which again may help him. Hopefully he can get back in hyperbarics as I think this is likely necessary. Nonetheless there does seem to be evidence of cellulitis noted today as well and in  general I am a little concerned in that regard. His wound unfortunately is significant on this left foot and is right where he takes the brunt of the force secondary to the Charcot arthropathy. I do not think a total contact cast is ideal due to the fact that he unfortunately is draining much too significantly he is also not happy with the idea of using a cast therefore he states he would not want to do that anyway. 09/16/2021 upon evaluation today patient's plantar foot ulcer unfortunately continues to show signs of issues here. He has been in the hospital due to trying to detox himself from alcohol he had withdrawal symptoms and subsequently was hospitalized. During that time it was recommended by both orthopedics as well as infectious disease apparently that he proceed with amputation. With that being said they discussed with him the risk of not doing so. This is  well- documented in the encounter. With that being said the patient does not want to proceed down that road and tells me that he declined their advice in that regard. Subsequently our goal then is to try to do what we can to try to get this thing healed and closed. I think this means he is getting need to stay off of it is much as possible he does have a wheelchair at home that he tells me he can use I think that that is going to be something that he does need to do. 09/23/2021 upon evaluation today patient continues to have significant issues here with his foot ulcer which is plantar on the left foot. Subsequently again he does have a history of Charcot foot he also has an issue of having had osteomyelitis as well as an open wound for some time here. Its been recommended multiple times for him to have an amputation although that is not something that he is really interested or wanting to do. 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 Subsequently this means that he also has a gait abnormality that is causing issues with his walking and causing abnormal pressures in the central part of his foot this is the reason he has a wound. Nonetheless I want to see what I can do about trying to get a boot to help with stabilization and offloading of working to see what we can do in that regard. Electronic Signature(s) Signed: 09/23/2021 2:55:50 PM By: Lenda Kelp PA-C Entered By: Lenda Kelp on 09/23/2021 14:55:50 -------------------------------------------------------------------------------- Chemical Cauterization Details Patient Name: Date of Service: [name], [name] IG 09/23/2021 12:30 PM Medical Record Number: [id number] Patient Account Number: [id number] Date of Birth/Sex: Treating RN: 18-Feb-1961 (60 y.o. [name] Primary Care Provider: [name] Other Clinician: Referring Provider: Treating Provider/Extender: [name] in Treatment: 15 Procedure Performed for: Wound #3 Left,Plantar Foot Performed By: Physician Lenda Kelp, PA Post Procedure Diagnosis Same as Pre-procedure Electronic Signature(s) Signed: 09/23/2021 4:51:36 PM By: Lenda Kelp PA-C Signed: 09/23/2021 4:54:52 PM By: [name] Entered By: [name] on 09/23/2021 12:58:26 -------------------------------------------------------------------------------- Physical Exam Details Patient Name: Date of Service: [name], [name] IG 09/23/2021 12:30 PM Medical Record Number: [id number] Patient Account Number: [id number] Date of Birth/Sex: Treating RN: 18-Sep-1961 (60 y.o. [name] Primary Care Provider: [name] Other Clinician: Referring Provider: Treating Provider/Extender: Atlee Abide Weeks in Treatment: 15 Constitutional Well-nourished and well-hydrated in no acute distress. Respiratory normal breathing without difficulty. Psychiatric this patient is  able to make decisions and demonstrates good insight into disease process. Alert and Oriented x 3. pleasant and cooperative. Notes Upon inspection patient's wound bed actually showed signs of good granulation and epithelization at this point. There was however some hypergranulation I do think continuing the silver nitrate at this point would be beneficial for him. The patient is in agreement with that plan. Overall I think that other than the silver nitrate is really nothing has been changed significantly here today. I do want to see about ordering the foot defender. Electronic Signature(s) Signed: 09/23/2021 2:56:25 PM By: Worthy Keeler PA-C Entered By: Worthy Keeler on 09/23/2021 14:56:24 -------------------------------------------------------------------------------- Physician Orders Details Patient Name: Date of Service: [name], [name] IG 09/23/2021 12:30 PM Medical Record Number: [id number] Patient Account Number: [id number] Date of  Birth/Sex: Treating RN: 1961/03/18 (60 y.o. Marcheta Grammes Primary Care Provider: Billey Gosling Other Clinician: Referring Provider: Treating Provider/Extender: Adele Schilder in Treatment: 15 Verbal / Phone Orders: No Diagnosis Coding ICD-10 Coding Code Description E11.621 Type 2 diabetes mellitus with foot ulcer M14.672 Charcot's joint, left ankle and foot L97.528 Non-pressure chronic ulcer of other part of left foot with other specified severity M86.372 Chronic multifocal osteomyelitis, left ankle and foot Follow-up Appointments ppointment in 1 week. - with Margarita Grizzle Return A Bathing/ Shower/ Hygiene May shower and wash wound with soap and water. - prior to dressing change Edema Control - Lymphedema / SCD / Other Elevate legs to the level of the heart or above for 30 minutes daily and/or when sitting, a frequency of: - throughout the day Avoid standing for long periods of time. Exercise regularly Moisturize legs daily. Compression stocking or Garment 30-40 mm/Hg pressure to: - wear the juxtalite HD to right and left leg. apply in the morning and remove at night. Off-Loading DH Walker Boot to: - Will order off loading boot from [employer] Open toe surgical shoe to: - left foot Other: - minimal weight bearing left foot use wheelchair for mobility. Additional Orders / Instructions Follow [place] wound care orders this week; continue Home Health for wound care. May utilize formulary equivalent dressing for wound treatment orders unless otherwise specified. - [facility] weekly and all other days patient to change. Wound Treatment Wound #3 - Foot Wound Laterality: Plantar, Left Cleanser: Soap and Water (Home Health) 1 x Per Day/15 Days Discharge Instructions: May shower and wash wound with dial antibacterial soap and water prior to dressing change. Cleanser: Wound Cleanser (Home Health) (Generic) 1 x Per Day/15 Days Discharge  Instructions: Cleanse the wound with wound cleanser prior to applying a clean dressing using gauze sponges, not tissue or cotton balls. Peri-Wound Care: Zinc Oxide Ointment 30g tube (Home Health) 1 x Per Day/15 Days Discharge Instructions: Apply Zinc Oxide as needed for any maceration or redness to periwound with each dressing change Peri-Wound Care: Sween Lotion (Moisturizing lotion) (Home Health) 1 x Per Day/15 Days Discharge Instructions: Apply moisturizing lotion as directed Prim Dressing: KerraCel Ag Gelling Fiber Dressing, 4x5 in (silver alginate) (Home Health) (Generic) 1 x Per Day/15 Days ary Discharge Instructions: Apply silver alginate to wound bed as instructed Secondary Dressing: ABD Pad, 5x9 (Home Health) (Generic) 1 x Per Day/15 Days Discharge Instructions: Apply over primary dressing as directed. Secondary Dressing: Zetuvit Plus 4x8 in [facility]) (Generic) 1 x Per Day/15 Days Discharge Instructions: Apply over primary dressing as directed. Secured With: [employer], 4.5x3.1 (in/yd) (Home Health) (Generic) 1 x Per Day/15  Days Discharge Instructions: Secure with Kerlix as directed. Secured With: 80M Medipore H Soft Cloth Surgical Tape, 2x2 (in/yd) (Home Health) (Generic) 1 x Per Day/15 Days Discharge Instructions: Secure dressing with tape as directed. Electronic Signature(s) Signed: 09/23/2021 4:51:36 PM By: Worthy Keeler PA-C Signed: 09/23/2021 4:54:52 PM By: Lorrin Jackson Entered By: Lorrin Jackson on 09/23/2021 13:00:37 -------------------------------------------------------------------------------- Problem List Details Patient Name: Date of Service: [name], [name] IG 09/23/2021 12:30 PM Medical Record Number: [id number] Patient Account Number: [id number] Date of Birth/Sex: Treating RN: Mar 14, 1961 (60 y.o. Marcheta Grammes Primary Care Provider: Billey Gosling Other Clinician: Referring Provider: Treating Provider/Extender: Adele Schilder in  Treatment: 15 Active Problems ICD-10 Encounter Code Description Active Date MDM Diagnosis E11.621 Type 2 diabetes mellitus with foot ulcer 06/04/2021 No Yes M86.372 Chronic multifocal osteomyelitis, left ankle and foot 06/04/2021 No Yes L97.528 Non-pressure chronic ulcer of other part of left foot with other specified 06/04/2021 No Yes severity M19.072 Primary osteoarthritis, left ankle and foot 09/23/2021 No Yes M14.672 Charcot's joint, left ankle and foot 06/04/2021 No Yes Inactive Problems Resolved Problems Electronic Signature(s) Signed: 09/23/2021 2:55:19 PM By: Worthy Keeler PA-C Previous Signature: 09/23/2021 12:51:22 PM Version By: Worthy Keeler PA-C Entered By: Worthy Keeler on 09/23/2021 14:55:18 -------------------------------------------------------------------------------- Progress Note Details Patient Name: Date of Service: [name], [name] IG 09/23/2021 12:30 PM Medical Record Number: [id number] Patient Account Number: [id number] Date of Birth/Sex: Treating RN: [birth year] (60 y.o. [name] Primary Care Provider: Billey Gosling Other Clinician: Referring Provider: Treating Provider/Extender: Adele Schilder in Treatment: 15 Subjective Chief Complaint Information obtained from Patient Bilateral foot diabetic ulcers History of Present Illness (HPI) 10/31/2019 upon evaluation today patient appears to be doing somewhat poorly in regard to his bilateral plantar feet. He has wounds that he tells me have been present since 2012 intermittently off and on. Most recently this has been open for at least the past 6 months to a year. He has been trying to treat this in different ways using Santyl along with various other dressings including Medihoney and even at one point Xeroform. Nothing really has seem to get this completely closed. He was recently in the hospital for cellulitis of his leg subsequently he did have x-rays as well as MRIs that showed negative  for any signs of osteomyelitis in regard to the wounds on his feet. Fortunately there is no signs of systemic infection at this time. No fevers, chills, nausea, vomiting, or diarrhea. Patient has previously used [employer] offloading shoes as far as frontal floaters as well as postop surgical shoes. He has never been in a total contact cast that may be something we need to strongly consider here. Patient's most recent hemoglobin A1c 1 month ago was 5.3 seems to be very well controlled which is great. Subsequently he has seen vascular as well as podiatry. His ABIs are 1.07 on the left and 1.14 on the right he seems to be doing well he does have chronic venous stasis. 11/07/2019 upon evaluation today patient appears to be doing well with regard to his wounds all things considered. I do not see any severe worsening he still has some callus buildup on the right more than the left he notes that he has been probably more active than he should as far as walking is concerned is just very hard to not be active. He knows he needs to be more careful in this regard however. He is willing to give the cast a  try at this point although he notes that he is a little nervous about this just with regard to balance although he will be very careful and obviously if he has any trouble he knows to contact the office and let me know. 1/22; patient is in for his obligatory first total contact cast change. Our intake nurse reported a very large amount of drainage which is spelled out over to the surrounding skin. Has bilateral diabetic foot wounds. He has Charcot feet. We have been using silver alginate on his wounds. 11/14/2019 on evaluation today patient is actually seeming to make good improvement in regard to his bilateral plantar foot wounds. We have been using a cast on the left side and on the right side he has been using dressings he is changing up his own accord. With that being said he tells me that he is also not  walking as much just due to how unsteady he feels. He takes it easy when he does have to walk and when he does not have to walk he is resting. This is probably help in his right foot as well has the left foot which is actually measuring better. In fact both are measuring better. Overall I am very pleased with how things seem to be progressing. The patient does have some odor on the left foot this does have me concerned about the possibility of infection, and actually probably go ahead and put him on antibiotics today as well as utilizing a continuation of the cast on the left foot I think that will be fine we probably just need to bring him in sooner to change this not last a whole week. 1/29; we brought the patient back today for a total contact cast change on the left out of concern for excessive drainage. We are using drawtex over the wound as the primary dressing 11/21/2019 on evaluation today patient appears to be doing well with regard to his left plantar foot. In fact both foot ulcers actually seem to be doing pretty well. Nonetheless he is having a lot of drainage on the left at this time and again we did obtain a wound culture did show positive for Staph aureus that was reviewed by myself today as well. Nonetheless he is on Bactrim which was shown to be sensitive that should be helping in this regard. Fortunately there is no signs of infection systemically at this point. 2/5; back in clinic today for a total contact cast change apparently secondary to very significant drainage. Still using drawtex 11/28/2019 upon evaluation today patient appears to be doing well with regard to his wounds. The right foot is doing okay as measured about the same in my opinion. The left foot is actually showing signs of significant improvement is measuring smaller there is a lot of hyper granulation likely due to the continued drainage at this point. We did obtain approval for a snap VAC I think that is good to be  appropriate for him and will likely help this tremendously underneath the cast. He is definitely in agreement with proceeding with such. 2/12; patient came in today after his snap VAC lost suction. Brought in to see one of our nurses. The dressing was replaced and then we put the cast back on and rehooked up the snap VAC. Apparently his wound looked very good per our intake nurse. 2/15; again we replaced the cast on Friday. By Saturday the snap VAC and light suction. He called this morning he comes in acutely.  The wounds look fine however the VAC is not functioning. We replaced the cast using silver alginate as the primary dressing backed with Kerramax. The snap VAC was not replaced 12/05/2019 upon evaluation today patient appears to be doing better in regard to his left plantar foot ulcer. Fortunately there is no signs of active infection at this time. Unfortunately he is continuing to have issues with the right foot he is really not making any progress here things seem to be somewhat stagnant to be honest. The depth has increased but that is due to me having debrided the wound in the past based on what I am seeing. 12/12/2019 on evaluation today patient appears to be doing more poorly in regard to the left lower extremity. He has some erythema spreading up the side of his foot I am concerned about infection again at this point. Unfortunately he has been seeing improvement with a total contact cast but I do not think we should put that on today. On his right plantar foot he continues to have significant drainage this is actually measuring deeper I really do not feel like you are making any progress whatsoever. I have prescribed Granix for him unfortunately his insurance apparently was going to cost him a $500 co-pay. 12/19/2019 upon evaluation today patient actually appears to be doing better in regard to both wounds. With that being said he actually did get the reGranix which he had to pay $500 for. With  that being said it does look like that he is actually made some improvement based on what I am seeing at this point with the reGranix. Obviously if he is going to continue this we are going to do something about trying to get him some help in covering the cost. 12/26/2019 on evaluation today patient appears to be doing really much better even compared to last week. Overall the wound seems to be much better even compared to last week and last week was better than the week before. Since has been using the reGranix his symptoms have improved significantly. With that being said the issue right now is simply that this is a very expensive medication for him the first dose cost him $500. Upon inspection patient's wound bed actually is however dramatically improved compared to before he started this 2 weeks ago. 01/02/2020 upon evaluation today patient appears to actually be doing well. He still had a little bit of reGranix left that has been using in small amounts he just been applying it every other day instead of every day in order to make it go longer. Overall we are still seeing excellent improvement he is measuring smaller looking better healthier tissue and everything seems to be pointing to this headed in the right direction. Fortunately there is no evidence of infection either which is also excellent news. He does have his MRI coming up within the next week. 01/09/2020 upon evaluation today patient appears to be doing a little worse today compared to previous week's evaluation. He is actually been out of the reGranix at this point. He has been trying to make the stretch out so he has been changing the dressings on a regular set schedule like he was previous. I think this has made a difference. Fortunately there is no signs of active infection at this time. No fevers, chills, nausea, vomiting, or diarrhea. 01/16/2020 upon evaluation today patient appears to be doing well with regard to his left plantar foot  ulcer. The right plantar foot still shows some significant  depth at this point. Fortunately there is no signs of active infection at this time. 01/30/2020 upon evaluation today patient appears to be doing about the same in regard to his right plantar foot ulcer there is still some depth here and we had to wait till he actually switched over to his new insurance to get approval for the MRI under his new insurance plan. With that being said he now has switched as of April 1. Fortunately there is no signs of active infection at this time. Overall in regard to his left foot ulcer this seems to be doing much better and I am actually very pleased with how things are going. With that being said it is not quite as much progress as we were seeing with the reGranix but at the same time he has had trouble getting this apparently there is been some hindrance here. I Georgina Peer try to actually send this to [facility] that was recommended by the drug rep to me. 02/13/20 upon evaluation today patient appears to be doing better in regard to his left foot ulcer this is great news. Unfortunately the right foot ulcer is not really significantly better at this time. There is no signs of active infection systemically though he did have his MRI which showed unfortunately he does have infection noted including an abscess in the foot. There is also marrow changes noted which are consistent with osteomyelitis based on the radiology review and interpretation. Unfortunately considering that the wound is not really making the progress that we will he would like to be seen I think that this is an indication that he may need some further referral both infectious disease as well as potentially to podiatrist to see if there is anything that can be done to help with the situation that were dealing with here. The left foot again is doing great. READMISSION 06/04/2021 This is a 59 year old man who was in the clinic in 2021 followed by  Jeri Cos for areas on the right and left foot. He developed a left foot infection and was referred to ID. He left the clinic in a nonhealed state and was followed for a period of [facility] Cannon Kettle. Apparently things really deteriorated in early July when he was admitted to hospital from 04/27/2021 through 05/06/2021 with sepsis secondary to a left foot infection. His blood cultures were negative. An MRI suggested fifth metatarsal osteomyelitis a left ankle septic joint. He was treated with vancomycin and ertapenem which he is still taking and may just about be finishing. He was seen by orthopedics and the patient adamantly refused to BKA. As far as I can tell he did not have the ankle aspirated I am not exactly sure what the issue was here. Since he has been discharged she is at [facility] for rehabilitation. He was last seen by Dr. Drucilla Schmidt on 05/20/2021 he noted osteo of the tibial talar bone cuboid and fifth metatarsal which is even more extensive than what was suggested by the MRI. He is apparently going for a consultation with orthopedic surgery in [place] sometime next week. I received a call about this man 2 weeks ago from Dr. Gwenlyn Found who follows him for the possibility of PAD. ABIs I think done in the office showed a ABI on the right of 1.05 at the PTA and 0.99 at the PTA on the left. He had a DVT rule out in the left leg that was negative for the DVT. Past medical  history is extensive and includes diastolic heart failure, right first metatarsal head ulcer in 2021, excision of the right second ray by Dr. Cannon Kettle on 03/13/2020, hypertension, hypothyroidism. Left total hip replacement, right total knee replacement, carpal tunnel syndrome, obstructive sleep apnea alcohol abuse with cirrhosis although the patient denies current alcohol intake. The patient does not think he is a diabetic however looking through [facility] I see 2 HgbAic's of this year that were  greater than equal to 6.5 which by definition makes him diabetic. Nevertheless he is not on any treatment and does not check his blood sugars. The patient is now back home out of the nursing home. Saw Dr. Drucilla Schmidt last week he was taken off vancomycin and ertapenem on August 22 and now is on doxycycline on Augmentin. He also saw Dr. Buford Dresser who is his orthopedic surgeon in [place] he recommended a [employer]. He has been using Medihoney. 9/6I have been having trouble getting hyperbarics approved through our prior authorization process. Even though he had a limb threatening infection in the left foot and probably the left ankle there glitches in how some of the reports are worded also some of the consultants. In any case I am going to repeat his sedimentation rate and C-reactive protein. I am generally not in favor of doing things like this as they really do not alter the plan of care from my point of view however I am going to need to demonstrate that these remain high in order to get this through forhyperbaric treatment for chronic refractory osteomyelitis 9/13; following this man for a wound on his left plantar foot in the setting of type 2 diabetes and Charcot deformity. He has underlying chronic refractory osteomyelitis. Follow-up sedimentation rate and C-reactive protein were both elevated but the C-reactive protein was down to 1.4, sedimentation rate at 70. Sedimentation rate was only slightly down from previous at 85. His wound is measuring slightly smaller. 9/20; patient started hyperbaric oxygen therapy today and tolerated treatment well. This is for the underlying osteomyelitis. He remains on antibiotics but thinks he is getting close to finishing. The wound on the plantar aspect of his foot is the other issue we are following here. He is using Medihoney The patient has a Charcot foot in the setting of type 2 diabetes. He is going to need a total contact cast although his partner was away  this week and we elected to delay this till next week 9/27; patient still tolerating hyperbaric oxygen well. Wound looked generally healthy not much depth under illumination still 100% covered in fibrinous debris. Raised callused edges around the wound he was prepared for a total contact cast. We have been using Medihoney 9/30; patient is back for his first obligatory total contact cast change. We are using Hydrofera Blue. Noted by our intake nurse to have a lot of drainage or at least a moderate amount of drainage. I am not sure I was previously aware of this 10/4; patient arrives today with a lot of drainage under the cast. When he had it changed last Friday there was also a similar amount of drainage. Her intake nurse says that they tried a wound VAC on him perhaps while I was on vacation in August under the cast but that did not work. In my experience that has not been unusual. We have been using Hydrofera Blue with all the secondary absorbers. The drainage today went right through all of our dressings. The patient is concerned about his foot being  in a cast without much drainage. He is tolerating HBO well. There has been improvements in the wound in the mid part of his foot in the setting of a Charcot deformity 10/7; patient presents for cast change. He has no issues or complaints today. He denies signs of infection. 10/11; patient presents for cast change. At this time he would like to take a break from the cast. He would like to do daily dressing changes with Hydrofera Blue. He currently denies signs of infection. 10/14; his cast was taken off last week at his request. He arrives in the clinic with [facility]. He has been changing the dressing himself. He has way too much edema in the left foot and leg to consider a total contact cast. I do not really know the issue here. He does have chronic venous insufficiency His wife stopped me in the clinic earlier in the week to report he is  drinking again and she is concerned. I am uncertain whether there are other issues 10/18; he arrived in clinic last week having bilateral lower extremity edema likely secondary to chronic venous insufficiency there was too much edema in the left leg to apply a total contact cast. I put him in compression on the left leg to control the swelling. This week he cut the wrap on the foot for reasons that are unclear however today he arrives in clinic with a smaller left leg but massive edema in the left foot. He is supposed to be wearing a juxta lite stocking on the right leg but he is not wearing the contact layer. His attendance at hyperbaric oxygen has been dwindling, he did not dive yesterday and he did not dive today concerned about hyperbarics causing swelling. I looked back in his record his last echo was in 2020 essentially normal left and right ventricular function. Last BUN and creatinine were done in July this was normal. He is having a lot of drainage in the left plantar foot wound 10/25; again he comes in today having missed HBO yesterday. Macerated skin around the wound which really does not look very good at all. A lot of edema in the left foot but an improvement in edema on the left leg. We wrapped him last week because of the amount of swelling in the left leg. We could not apply a total contact cast. The patient states that he wants to be able to change his dressing himself. I might consider this if he had stockings to control the swelling. He said he be here for HBO tomorrow. I will check the degree of erythema in his forefoot which I have marked. He is on doxycycline and Augmentin which was renewed by Dr. Drucilla Schmidt but I cannot see a follow-up note, follow-up inflammatory markers etc. I 1 point he said he was going to see his orthopedic surgeon in [place] I am not sure if he ever did this. I do not know why he has not followed up with infectious disease, he says he was not given  an appointment but he is still on Augmentin doxycycline 11/1; he did not do the lab work I ordered last week. Still on Augmentin and doxycycline. Silver alginate and he is changing this daily using his own juxta lite stocking. The surface of the wound does not look too bad. No real epithelialization however. The patient was seen today along with HBO 08/26/2021 upon evaluation today patient is being seen at his request by myself he wanted to transfer care  to see me. With that being said he has been seeing Dr. Dellia Nims since he came back in August of this year. Unfortunately he tells me he still having quite a bit of drainage. He is also significant erythema and warmth noted of the foot as well. He has been hit or miss with regard to hyperbaric oxygen therapy tells me that at this point he took this week off because he was extremely claustrophobic and having a lot of issues he plans to start back next week. Nonetheless he has been given some medication by Dr. Dellia Nims as well to help calm things down which again may help him. Hopefully he can get back in hyperbarics as I think this is likely necessary. Nonetheless there does seem to be evidence of cellulitis noted today as well and in general I am a little concerned in that regard. His wound unfortunately is significant on this left foot and is right where he takes the brunt of the force secondary to the Charcot arthropathy. I do not think a total contact cast is ideal due to the fact that he unfortunately is draining much too significantly he is also not happy with the idea of using a cast therefore he states he would not want to do that anyway. 09/16/2021 upon evaluation today patient's plantar foot ulcer unfortunately continues to show signs of issues here. He has been in the hospital due to trying to detox himself from alcohol he had withdrawal symptoms and subsequently was hospitalized. During that time it was recommended by both orthopedics as well as  infectious disease apparently that he proceed with amputation. With that being said they discussed with him the risk of not doing so. This is well- documented in the encounter. With that being said the patient does not want to proceed down that road and tells me that he declined their advice in that regard. Subsequently our goal then is to try to do what we can to try to get this thing healed and closed. I think this means he is getting need to stay off of it is much as possible he does have a wheelchair at home that he tells me he can use I think that that is going to be something that he does need to do. 09/23/2021 upon evaluation today patient continues to have significant issues here with his foot ulcer which is plantar on the left foot. Subsequently again he does have a history of Charcot foot he also has an issue of having had osteomyelitis as well as an open wound for some time here. Its been recommended multiple times for him to have an amputation although that is not something that he is really interested or wanting to do. 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 Subsequently this means that he also has a gait abnormality that is causing issues with his walking and causing abnormal pressures in the central part of his foot this is the reason he has a wound. Nonetheless I want to see what I can do about trying to get a boot to help with stabilization and offloading of working to see what we can do in that regard. Objective Constitutional Well-nourished and well-hydrated in no acute distress. Vitals Time Taken: 12:42 PM, Weight: 270 lbs, Temperature: 97.8 F, Pulse: 77 bpm, Respiratory Rate: 18 breaths/min, Blood Pressure: 131/79 mmHg. Respiratory normal breathing without difficulty. Psychiatric this patient is able to make decisions and demonstrates good insight into disease process.  Alert and Oriented x 3. pleasant and cooperative. General  Notes: Upon inspection patient's wound bed actually showed signs of good granulation and epithelization at this point. There was however some hypergranulation I do think continuing the silver nitrate at this point would be beneficial for him. The patient is in agreement with that plan. Overall I think that other than the silver nitrate is really nothing has been changed significantly here today. I do want to see about ordering the foot defender. Integumentary (Hair, Skin) Wound #3 status is Open. Original cause of wound was Gradually Appeared. The date acquired was: 03/18/2021. The wound has been in treatment 15 weeks. The wound is located on the [place]. The wound measures 3.6cm length x 3.5cm width x 0.2cm depth; 9.896cm^2 area and 1.979cm^3 volume. There is Fat Layer (Subcutaneous Tissue) exposed. There is no tunneling or undermining noted. There is a large amount of serosanguineous drainage noted. The wound margin is distinct with the outline attached to the wound base. There is large (67-100%) red, hyper - granulation within the wound bed. There is no necrotic tissue within the wound bed. General Notes: Calloused periwound Assessment Active Problems ICD-10 Type 2 diabetes mellitus with foot ulcer Chronic multifocal osteomyelitis, left ankle and foot Non-pressure chronic ulcer of other part of left foot with other specified severity Primary osteoarthritis, left ankle and foot Charcot's joint, left ankle and foot Procedures Wound #3 Pre-procedure diagnosis of Wound #3 is a Diabetic Wound/Ulcer of the Lower Extremity located on the Left,Plantar Foot . An Chemical Cauterization procedure was performed by Worthy Keeler, PA. Post procedure Diagnosis Wound #3: Same as Pre-Procedure Plan Follow-up Appointments: Return Appointment in 1 week. - with Glynn Octave Shower/ Hygiene: May shower and wash wound with soap and water. - prior to dressing change Edema Control - Lymphedema /  SCD / Other: Elevate legs to the level of the heart or above for 30 minutes daily and/or when sitting, a frequency of: - throughout the day Avoid standing for long periods of time. Exercise regularly Moisturize legs daily. Compression stocking or Garment 30-40 mm/Hg pressure to: - wear the juxtalite HD to right and left leg. apply in the morning and remove at night. Off-Loading: DH Walker Boot to: - Will order off loading boot from [employer] Open toe surgical shoe to: - left foot Other: - minimal weight bearing left foot use wheelchair for mobility. Additional Orders / Instructions: Follow Nutritious Diet Home Health: New wound care orders this week; continue Home Health for wound care. May utilize formulary equivalent dressing for wound treatment orders unless otherwise specified. - [facility] weekly and all other days patient to change. WOUND #3: - Foot Wound Laterality: Plantar, Left Cleanser: Soap and Water (Home Health) 1 x Per [id number] Days Discharge Instructions: May shower and wash wound with dial antibacterial soap and water prior to dressing change. Cleanser: Wound Cleanser (Home Health) (Generic) 1 x Per Day/15 Days Discharge Instructions: Cleanse the wound with wound cleanser prior to applying a clean dressing using gauze sponges, not tissue or cotton balls. Peri-Wound Care: Zinc Oxide Ointment 30g tube (Home Health) 1 x Per Day/15 Days Discharge Instructions: Apply Zinc Oxide as needed for any maceration or redness to periwound with each dressing change Peri-Wound Care: Sween Lotion (Moisturizing lotion) (Home Health) 1 x Per Day/15 Days Discharge Instructions: Apply moisturizing lotion as directed Prim Dressing: KerraCel Ag Gelling Fiber Dressing, 4x5 in (silver alginate) (Home Health) (Generic) 1 x Per Day/15 Days ary Discharge Instructions: Apply  silver alginate to wound bed as instructed Secondary Dressing: ABD Pad, 5x9 (Home Health) (Generic) 1 x Per Day/15  Days Discharge Instructions: Apply over primary dressing as directed. Secondary Dressing: Zetuvit Plus 4x8 in [facility]) (Generic) 1 x Per Day/15 Days Discharge Instructions: Apply over primary dressing as directed. Secured With: [employer], 4.5x3.1 (in/yd) (Home Health) (Generic) 1 x Per Day/15 Days Discharge Instructions: Secure with Kerlix as directed. Secured With: 65M Medipore H Soft Cloth Surgical T ape, 2x2 (in/yd) (Home Health) (Generic) 1 x Per Day/15 Days Discharge Instructions: Secure dressing with tape as directed. 1. I would recommend currently that we go ahead and continue with the wound care measures as before which includes the use of the silver alginate dressing which I think is doing a good job here. 2. I am going to continue with serial treatments with the chemical cauterization with silver nitrate think this is definitely something that is helping to flatten out the hypergranulation. 3. We will continue with zinc around the edges of the wound and then a protective dressing over the wound itself. We will see patient back for reevaluation in 1 week here in the clinic. If anything worsens or changes patient will contact our office for additional recommendations. Electronic Signature(s) Signed: 09/23/2021 2:57:31 PM By: Worthy Keeler PA-C Entered By: Worthy Keeler on 09/23/2021 14:57:30 -------------------------------------------------------------------------------- SuperBill Details Patient Name: Date of Service: [name], [name] IG 09/23/2021 Medical Record Number: [id number] Patient Account Number: [id number] Date of Birth/Sex: Treating RN: [id number] (60 y.o. Marcheta Grammes Primary Care Provider: Billey Gosling Other Clinician: Referring Provider: Treating Provider/Extender: Adele Schilder in Treatment: 15 Diagnosis Coding ICD-10 Codes Code Description E11.621 Type 2 diabetes mellitus with foot ulcer M14.672 Charcot's joint, left ankle  and foot L97.528 Non-pressure chronic ulcer of other part of left foot with other specified severity M86.372 Chronic multifocal osteomyelitis, left ankle and foot Facility Procedures CPT4 Code: [id number] Description: [id number] - CHEM CAUT GRANULATION TISS ICD-10 Diagnosis Description L97.528 Non-pressure chronic ulcer of other part of left foot with other specified severit Modifier: y Quantity: 1 Physician Procedures : CPT4 Code Description Modifier [id number] - WC PHYS LEVEL 4 - EST PT 25 ICD-10 Diagnosis Description E11.621 Type 2 diabetes mellitus with foot ulcer M14.672 Charcot's joint, left ankle and foot L97.528 Non-pressure chronic ulcer of other part of  left foot with other specified severity M86.372 Chronic multifocal osteomyelitis, left ankle and foot Quantity: 1 : [id number] - WC PHYS CHEM CAUT GRAN TISSUE ICD-10 Diagnosis Description L97.528 Non-pressure chronic ulcer of other part of left foot with other specified severity Quantity: 1 Electronic Signature(s) Signed: 09/23/2021 2:57:52 PM By: Worthy Keeler PA-C Entered By: Worthy Keeler on 09/23/2021 14:57:50

## 2021-09-23 NOTE — Progress Notes (Signed)
FAITH, BRANAN ([id number]) Visit Report for 09/23/2021 Arrival Information Details Patient Name: Date of Service: LANNIE, YUSUF 09/23/2021 12:30 PM Medical Record Number: [id number] Patient Account Number: [id number] Date of Birth/Sex: Treating RN: 05/18/1961 (60 y.o. Marcheta Grammes Primary Care Anayah Arvanitis: Billey Gosling Other Clinician: Referring Kawana Hegel: Treating Jaiyana Canale/Extender: Adele Schilder in Treatment: 15 Visit Information History Since Last Visit Added or deleted any medications: No Patient Arrived: [name] Any new allergies or adverse reactions: No Arrival Time: 12:38 Had a fall or experienced change in No Transfer Assistance: None activities of daily living that may affect Patient Identification Verified: Yes risk of falls: Secondary Verification Process Completed: Yes Signs or symptoms of abuse/neglect since No Patient Requires Transmission-Based Precautions: No last visito Patient Has Alerts: Yes Hospitalized since last visit: No Implantable device outside of the clinic No excluding cellular tissue based products placed in the center since last visit: Has Dressing in Place as Prescribed: Yes Has Footwear/Offloading in Place as Yes Prescribed: Left: Surgical Shoe with Pressure Relief Insole Pain Present Now: No Electronic Signature(s) Signed: 09/23/2021 4:54:52 PM By: Lorrin Jackson Entered By: Lorrin Jackson on 09/23/2021 12:42:23 -------------------------------------------------------------------------------- Encounter Discharge Information Details Patient Name: Date of Service: [name] 09/23/2021 12:30 PM Medical Record Number: [id number] Patient Account Number: [id number] Date of Birth/Sex: Treating RN: 09/20/1961 (60 y.o. Marcheta Grammes Primary Care Drexel Ivey: Billey Gosling Other Clinician: Referring Manessa Buley: Treating Gaylen Pereira/Extender: Adele Schilder in Treatment: 15 Encounter Discharge Information  Items Discharge Condition: Stable Ambulatory Status: Walker Discharge Destination: Home Transportation: Private Auto Schedule Follow-up Appointment: Yes Clinical Summary of Care: Provided on 09/23/2021 Form Type Recipient Paper Patient Patient Electronic Signature(s) Signed: 09/23/2021 4:54:52 PM By: Lorrin Jackson Entered By: Lorrin Jackson on 09/23/2021 13:02:29 -------------------------------------------------------------------------------- Lower Extremity Assessment Details Patient Name: Date of Service: OLUWADAMILOLA, ROSAMOND IG 09/23/2021 12:30 PM Medical Record Number: [id number] Patient Account Number: [id number] Date of Birth/Sex: Treating RN: 1961/06/25 (60 y.o. Marcheta Grammes Primary Care Teagan Ozawa: Billey Gosling Other Clinician: Referring Statia Burdick: Treating Shakaya Bhullar/Extender: Landis Martins Weeks in Treatment: 15 Edema Assessment Assessed: [Left: Yes] [Right: No] Edema: [Left: Ye] [Right: s] Calf Left: Right: Point of Measurement: 36 cm From Medial Instep 41 cm Ankle Left: Right: Point of Measurement: 13 cm From Medial Instep 32 cm Vascular Assessment Pulses: Dorsalis Pedis Palpable: [Left:Yes] Electronic Signature(s) Signed: 09/23/2021 4:54:52 PM By: Lorrin Jackson Entered By: Lorrin Jackson on 09/23/2021 12:48:11 -------------------------------------------------------------------------------- Multi-Disciplinary Care Plan Details Patient Name: Date of Service: [name] 09/23/2021 12:30 PM Medical Record Number: [id number] Patient Account Number: [id number] Date of Birth/Sex: Treating RN: 11-02-1960 (60 y.o. Marcheta Grammes Primary Care Ademide Schaberg: Billey Gosling Other Clinician: Referring Zoey Bidwell: Treating Seena Face/Extender: Adele Schilder in Treatment: 15 Multidisciplinary Care Plan reviewed with physician Active Inactive HBO Nursing Diagnoses: Anxiety related to feelings of confinement associated with the hyperbaric  oxygen chamber Anxiety related to knowledge deficit of hyperbaric oxygen therapy and treatment procedures Discomfort related to temperature and humidity changes inside hyperbaric chamber Potential for barotraumas to ears, sinuses, teeth, and lungs or cerebral gas embolism related to changes in atmospheric pressure inside hyperbaric oxygen chamber Potential for oxygen toxicity seizures related to delivery of 100% oxygen at an increased atmospheric pressure Potential for pulmonary oxygen toxicity related to delivery of 100% oxygen at an increased atmospheric pressure Goals: Barotrauma will be prevented during HBO2 Date Initiated: 06/04/2021 Target Resolution Date: 10/16/2021 Goal Status: Active Patient and/or family will be able to state/discuss  factors appropriate to the management of their disease process during treatment Date Initiated: 06/04/2021 Date Inactivated: 08/04/2021 Target Resolution Date: 08/14/2021 Goal Status: Met Patient will tolerate the hyperbaric oxygen therapy treatment Date Initiated: 06/04/2021 Date Inactivated: 09/16/2021 Target Resolution Date: 09/19/2021 Goal Status: Met Patient will tolerate the internal climate of the chamber Date Initiated: 06/04/2021 Date Inactivated: 09/16/2021 Target Resolution Date: 09/19/2021 Goal Status: Met Patient/caregiver will verbalize understanding of HBO goals, rationale, procedures and potential hazards Date Initiated: 06/04/2021 Date Inactivated: 07/28/2021 Target Resolution Date: 08/14/2021 Goal Status: Met Signs and symptoms of pulmonary oxygen toxicity will be recognized and promptly addressed Date Initiated: 06/04/2021 Date Inactivated: 09/16/2021 Target Resolution Date: 09/19/2021 Goal Status: Met Signs and symptoms of seizure will be recognized and promptly addressed ; seizing patients will suffer no harm Date Initiated: 06/04/2021 Date Inactivated: 09/16/2021 Target Resolution Date: 09/19/2021 Goal Status:  Met Interventions: Administer a five (5) minute air break for patient if signs and symptoms of seizure appear and notify the hyperbaric physician Administer decongestants, per physician orders, prior to HBO2 Administer the correct therapeutic gas delivery based on the patients needs and limitations, per physician order Assess and provide for patients comfort related to the hyperbaric environment and equalization of middle ear Assess for signs and symptoms related to adverse events, including but not limited to confinement anxiety, pneumothorax, oxygen toxicity and baurotrauma Assess patient for any history of confinement anxiety Assess patient's knowledge and expectations regarding hyperbaric medicine and provide education related to the hyperbaric environment, goals of treatment and prevention of adverse events Implement protocols to decrease risk of pneumothorax in high risk patients Notes: Nutrition Nursing Diagnoses: Impaired glucose control: actual or potential Potential for alteratiion in Nutrition/Potential for imbalanced nutrition Goals: Patient/caregiver agrees to and verbalizes understanding of need to use nutritional supplements and/or vitamins as prescribed Date Initiated: 06/04/2021 Date Inactivated: 07/07/2021 Target Resolution Date: 07/04/2021 Goal Status: Met Patient/caregiver will maintain therapeutic glucose control Date Initiated: 06/04/2021 Target Resolution Date: 10/28/2021 Goal Status: Active Interventions: Assess HgA1c results as ordered upon admission and as needed Assess patient nutrition upon admission and as needed per policy Provide education on elevated blood sugars and impact on wound healing Notes: 07/07/21: Glucose control ongoing Electronic Signature(s) Signed: 09/23/2021 4:54:52 PM By: Lorrin Jackson Entered By: Lorrin Jackson on 09/23/2021 12:48:44 -------------------------------------------------------------------------------- Pain Assessment  Details Patient Name: Date of Service: FRAN, MCREE IG 09/23/2021 12:30 PM Medical Record Number: [id number] Patient Account Number: [id number] Date of Birth/Sex: Treating RN: 05/31/1961 (60 y.o. Marcheta Grammes Primary Care Caidon Foti: Billey Gosling Other Clinician: Referring Cassiel Fernandez: Treating Diego Ulbricht/Extender: Adele Schilder in Treatment: 15 Active Problems Location of Pain Severity and Description of Pain Patient Has Paino No Site Locations Pain Management and Medication Current Pain Management: Electronic Signature(s) Signed: 09/23/2021 4:54:52 PM By: Lorrin Jackson Entered By: Lorrin Jackson on 09/23/2021 12:43:13 -------------------------------------------------------------------------------- Patient/Caregiver Education Details Patient Name: Date of Service: Emelia Salisbury 12/7/2022andnbsp12:30 PM Medical Record Number: [id number] Patient Account Number: [id number] Date of Birth/Gender: Treating RN: 05-30-1961 (60 y.o. Marcheta Grammes Primary Care Physician: Billey Gosling Other Clinician: Referring Physician: Treating Physician/Extender: Adele Schilder in Treatment: 15 Education Assessment Education Provided To: Patient Education Topics Provided Elevated Blood Sugar/ Impact on Healing: Methods: Explain/Verbal, Printed Responses: State content correctly Wound/Skin Impairment: Methods: Explain/Verbal, Printed Responses: State content correctly Electronic Signature(s) Signed: 09/23/2021 4:54:52 PM By: Lorrin Jackson Entered By: Lorrin Jackson on 09/23/2021 12:49:06 -------------------------------------------------------------------------------- Wound Assessment Details Patient Name: Date of Service: Sabra Heck, Dixon IG 09/23/2021  12:30 PM Medical Record Number: [id number] Patient Account Number: [id number] Date of Birth/Sex: Treating RN: 1961/10/18 (60 y.o. Marcheta Grammes Primary Care Sharrie Self: Billey Gosling Other  Clinician: Referring Isidoro Santillana: Treating Terry Abila/Extender: Landis Martins Weeks in Treatment: 15 Wound Status Wound Number: 3 Primary Diabetic Wound/Ulcer of the Lower Extremity Etiology: Wound Location: Left, Plantar Foot Wound Open Wounding Event: Gradually Appeared Status: Date Acquired: 03/18/2021 Comorbid Anemia, Sleep Apnea, Congestive Heart Failure, Hypertension, Weeks Of Treatment: 15 History: Peripheral Venous Disease, Cirrhosis , Type II Diabetes, Clustered Wound: No Osteoarthritis, Neuropathy Photos Wound Measurements Length: (cm) 3.6 Width: (cm) 3.5 Depth: (cm) 0.2 Area: (cm) 9.896 Volume: (cm) 1.979 % Reduction in Area: -40% % Reduction in Volume: -179.9% Epithelialization: Small (1-33%) Tunneling: No Undermining: No Wound Description Classification: Grade 3 Wound Margin: Distinct, outline attached Exudate Amount: Large Exudate Type: Serosanguineous Exudate Color: red, brown Foul Odor After Cleansing: No Slough/Fibrino No Wound Bed Granulation Amount: Large (67-100%) Exposed Structure Granulation Quality: Red, Hyper-granulation Fascia Exposed: No Necrotic Amount: None Present (0%) Fat Layer (Subcutaneous Tissue) Exposed: Yes Tendon Exposed: No Muscle Exposed: No Joint Exposed: No Bone Exposed: No Assessment Notes Calloused periwound Treatment Notes Wound #3 (Foot) Wound Laterality: Plantar, Left Cleanser Soap and Water Discharge Instruction: May shower and wash wound with dial antibacterial soap and water prior to dressing change. Wound Cleanser Discharge Instruction: Cleanse the wound with wound cleanser prior to applying a clean dressing using gauze sponges, not tissue or cotton balls. Peri-Wound Care Zinc Oxide Ointment 30g tube Discharge Instruction: Apply Zinc Oxide as needed for any maceration or redness to periwound with each dressing change Sween Lotion (Moisturizing lotion) Discharge Instruction: Apply moisturizing  lotion as directed Topical Primary Dressing KerraCel Ag Gelling Fiber Dressing, 4x5 in (silver alginate) Discharge Instruction: Apply silver alginate to wound bed as instructed Secondary Dressing ABD Pad, 5x9 Discharge Instruction: Apply over primary dressing as directed. Zetuvit Plus 4x8 in Discharge Instruction: Apply over primary dressing as directed. Secured With [employer], 4.5x3.1 (in/yd) Discharge Instruction: Secure with Kerlix as directed. 35M Medipore H Soft Cloth Surgical T ape, 2x2 (in/yd) Discharge Instruction: Secure dressing with tape as directed. Compression Wrap Compression Stockings Add-Ons Electronic Signature(s) Signed: 09/23/2021 4:54:52 PM By: Lorrin Jackson Entered By: Lorrin Jackson on 09/23/2021 12:53:35 -------------------------------------------------------------------------------- Vitals Details Patient Name: Date of Service: NIKASH, MORTENSEN IG 09/23/2021 12:30 PM Medical Record Number: [id number] Patient Account Number: [id number] Date of Birth/Sex: Treating RN: December 05, 1960 (60 y.o. Marcheta Grammes Primary Care Jolon Degante: Billey Gosling Other Clinician: Referring Arnesha Schiraldi: Treating Kriss Ishler/Extender: Landis Martins Weeks in Treatment: 15 Vital Signs Time Taken: 12:42 Temperature (F): 97.8 Weight (lbs): 270 Pulse (bpm): 77 Respiratory Rate (breaths/min): 18 Blood Pressure (mmHg): 131/79 Reference Range: 80 - 120 mg / dl Electronic Signature(s) Signed: 09/23/2021 4:54:52 PM By: Lorrin Jackson Entered By: Lorrin Jackson on 09/23/2021 12:42:40

## 2021-09-28 ENCOUNTER — Encounter: Payer: Self-pay | Admitting: Orthopaedic Surgery

## 2021-09-29 ENCOUNTER — Other Ambulatory Visit: Payer: Self-pay

## 2021-09-29 DIAGNOSIS — M5416 Radiculopathy, lumbar region: Secondary | ICD-10-CM

## 2021-09-30 ENCOUNTER — Encounter (HOSPITAL_BASED_OUTPATIENT_CLINIC_OR_DEPARTMENT_OTHER): Payer: Medicare Other | Admitting: Physician Assistant

## 2021-09-30 ENCOUNTER — Other Ambulatory Visit: Payer: Self-pay

## 2021-09-30 ENCOUNTER — Other Ambulatory Visit: Payer: Self-pay | Admitting: Internal Medicine

## 2021-09-30 ENCOUNTER — Telehealth: Payer: Self-pay | Admitting: Physician Assistant

## 2021-09-30 ENCOUNTER — Telehealth: Payer: Self-pay | Admitting: Internal Medicine

## 2021-09-30 DIAGNOSIS — K7030 Alcoholic cirrhosis of liver without ascites: Secondary | ICD-10-CM

## 2021-09-30 DIAGNOSIS — E1169 Type 2 diabetes mellitus with other specified complication: Secondary | ICD-10-CM | POA: Diagnosis not present

## 2021-09-30 NOTE — Progress Notes (Signed)
[name], [name] ([id number]) Visit Report for 09/30/2021 Arrival Information Details Patient Name: Date of Service: [name], [name] 09/30/2021 2:00 PM Medical Record Number: [id number] Patient Account Number: [id number] Date of Birth/Sex: Treating RN: October 18, 1961 (60 y.o. [name] Primary Care [name]: [name] Other Clinician: Referring [name]: Treating [name]/Extender: [name]: 16 Visit Information History Since Last Visit Added or deleted any medications: No Patient Arrived: [name] Any new allergies or adverse reactions: No Arrival Time: 14:41 Had a fall or experienced change in No Accompanied By: self activities of daily living that may affect Transfer Assistance: None risk of falls: Patient Identification Verified: Yes Signs or symptoms of abuse/neglect since last visito No Secondary Verification Process Completed: Yes Hospitalized since last visit: No Patient Requires Transmission-Based Precautions: No Implantable device outside of the clinic excluding No Patient Has Alerts: Yes cellular tissue based products placed in the center since last visit: Has Dressing in Place as Prescribed: Yes Pain Present Now: No Electronic Signature(s) Signed: 09/30/2021 3:58:17 PM By: [name] Entered By: [name] on 09/30/2021 14:43:12 -------------------------------------------------------------------------------- Encounter Discharge Information Details Patient Name: Date of Service: [name] IG 09/30/2021 2:00 PM Medical Record Number: [id number] Patient Account Number: [id number] Date of Birth/Sex: Treating RN: 08-24-1961 (60 y.o. [name] Primary Care [name]: [name] Other Clinician: Referring [name]: Treating [name]/Extender: [name]: [phone] Encounter Discharge Information Items Discharge Condition: Stable Ambulatory Status: Walker Discharge  Destination: Home Transportation: Private Auto Schedule Follow-up Appointment: Yes Clinical Summary of Care: Provided on 09/30/2021 Form Type Recipient Paper Patient Patient Electronic Signature(s) Signed: 09/30/2021 5:19:40 PM By: [name] Entered By: [name] on 09/30/2021 15:08:47 -------------------------------------------------------------------------------- Lower Extremity Assessment Details Patient Name: Date of Service: [name] IG 09/30/2021 2:00 PM Medical Record Number: [id number] Patient Account Number: [id number] Date of Birth/Sex: Treating RN: 1961/05/29 (60 y.o. [name] Primary Care [name]: [name] Other Clinician: Referring Karem Tomaso: Treating [name]/Extender: [name] Weeks in [name]: 16 Edema Assessment Assessed: [Left: Yes] [Right: No] Edema: [Left: Ye] [Right: s] Calf Left: Right: Point of Measurement: 36 cm From Medial Instep 41.5 cm Ankle Left: Right: Point of Measurement: 13 cm From Medial Instep 27 cm Vascular Assessment Pulses: Dorsalis Pedis Palpable: [Left:Yes] Electronic Signature(s) Signed: 09/30/2021 5:19:40 PM By: [name] Entered By: [name] on 09/30/2021 14:53:45 -------------------------------------------------------------------------------- Multi-Disciplinary Care Plan Details Patient Name: Date of Service: [name] IG 09/30/2021 2:00 PM Medical Record Number: [id number] Patient Account Number: [id number] Date of Birth/Sex: Treating RN: June 20, 1961 (60 y.o. [name] Primary Care [name]: [name] Other Clinician: Referring [name]: Treating [name]/Extender: [name]: 16 Multidisciplinary Care Plan reviewed with physician Active Inactive HBO Nursing Diagnoses: Anxiety related to feelings of confinement associated with the hyperbaric oxygen chamber Anxiety related to knowledge deficit of hyperbaric  oxygen therapy and [name] procedures Discomfort related to temperature and humidity changes inside hyperbaric chamber Potential for barotraumas to ears, sinuses, teeth, and lungs or cerebral gas embolism related to changes in atmospheric pressure inside hyperbaric oxygen chamber Potential for oxygen toxicity seizures related to delivery of 100% oxygen at an increased atmospheric pressure Potential for pulmonary oxygen toxicity related to delivery of 100% oxygen at an increased atmospheric pressure Goals: Barotrauma will be prevented during HBO2 Date Initiated: 06/04/2021 Target Resolution Date: 10/16/2021 Goal Status: Active Patient and/or family will be able to state/discuss factors appropriate to the management of their disease process during [name]  Date Initiated: 06/04/2021 Date Inactivated: 08/04/2021 Target Resolution Date: 08/14/2021 Goal Status: Met Patient will tolerate the hyperbaric oxygen therapy [name] Date Initiated: 06/04/2021 Date Inactivated: 09/16/2021 Target Resolution Date: 09/19/2021 Goal Status: Met Patient will tolerate the internal climate of the chamber Date Initiated: 06/04/2021 Date Inactivated: 09/16/2021 Target Resolution Date: 09/19/2021 Goal Status: Met Patient/caregiver will verbalize understanding of HBO goals, rationale, procedures and potential hazards Date Initiated: 06/04/2021 Date Inactivated: 07/28/2021 Target Resolution Date: 08/14/2021 Goal Status: Met Signs and symptoms of pulmonary oxygen toxicity will be recognized and promptly addressed Date Initiated: 06/04/2021 Date Inactivated: 09/16/2021 Target Resolution Date: 09/19/2021 Goal Status: Met Signs and symptoms of seizure will be recognized and promptly addressed ; seizing patients will suffer no harm Date Initiated: 06/04/2021 Date Inactivated: 09/16/2021 Target Resolution Date: 09/19/2021 Goal Status: Met Interventions: Administer a five (5) minute air break for patient if signs  and symptoms of seizure appear and notify the hyperbaric physician Administer decongestants, per physician orders, prior to HBO2 Administer the correct therapeutic gas delivery based on the patients needs and limitations, per physician order Assess and provide for patients comfort related to the hyperbaric environment and equalization of middle ear Assess for signs and symptoms related to adverse events, including but not limited to confinement anxiety, pneumothorax, oxygen toxicity and baurotrauma Assess patient for any history of confinement anxiety Assess patient's knowledge and expectations regarding hyperbaric medicine and provide education related to the hyperbaric environment, goals of [name] and prevention of adverse events Implement protocols to decrease risk of pneumothorax in high risk patients Notes: Nutrition Nursing Diagnoses: Impaired glucose control: actual or potential Potential for alteratiion in Nutrition/Potential for imbalanced nutrition Goals: Patient/caregiver agrees to and verbalizes understanding of need to use nutritional supplements and/or vitamins as prescribed Date Initiated: 06/04/2021 Date Inactivated: 07/07/2021 Target Resolution Date: 07/04/2021 Goal Status: Met Patient/caregiver will maintain therapeutic glucose control Date Initiated: 06/04/2021 Target Resolution Date: 10/28/2021 Goal Status: Active Interventions: Assess HgA1c results as ordered upon admission and as needed Assess patient nutrition upon admission and as needed per policy Provide education on elevated blood sugars and impact on wound healing Notes: 07/07/21: Glucose control ongoing Electronic Signature(s) Signed: 09/30/2021 5:19:40 PM By: [name] Entered By: [name] on 09/30/2021 14:55:11 -------------------------------------------------------------------------------- Pain Assessment Details Patient Name: Date of Service: [name], [name] IG 09/30/2021 2:00 PM Medical  Record Number: [id number] Patient Account Number: [id number] Date of Birth/Sex: Treating RN: 26-Jul-1961 (60 y.o. [name] Primary Care Elen Acero: [name] Other Clinician: Referring Isaly Fasching: Treating Shi Blankenship/Extender: [name]: 16 Active Problems Location of Pain Severity and Description of Pain Patient Has Paino No Site Locations Pain Management and Medication Current Pain Management: Electronic Signature(s) Signed: 09/30/2021 3:58:17 PM By: [name] Signed: 09/30/2021 4:24:09 PM By: Baruch Gouty RN, BSN Entered By: [name] on 09/30/2021 14:43:39 -------------------------------------------------------------------------------- Patient/Caregiver Education Details Patient Name: Date of Service: [name] 12/14/2022andnbsp2:00 PM Medical Record Number: [id number] Patient Account Number: [id number] Date of Birth/Gender: Treating RN: 1961/02/16 (60 y.o. [name] Primary Care Physician: [name] Other Clinician: Referring Physician: Treating Physician/Extender: [name]: 16 Education Assessment Education Provided To: Patient Education Topics Provided Elevated Blood Sugar/ Impact on Healing: Methods: Explain/Verbal, Printed Responses: State content correctly Offloading: Methods: Explain/Verbal, Printed Responses: State content correctly Wound/Skin Impairment: Methods: Explain/Verbal, Printed Responses: State content correctly Electronic Signature(s) Signed: 09/30/2021 5:19:40 PM By: [name] Entered By: [name] on 09/30/2021 14:55:45 -------------------------------------------------------------------------------- Wound Assessment Details Patient Name: Date  of Service: [name], [name] IG 09/30/2021 2:00 PM Medical Record Number: [id number] Patient Account Number: [id number] Date of Birth/Sex: Treating RN: 1960/12/08 (60 y.o. [name] Primary Care Neelie Welshans: [name] Other Clinician: Referring Wasim Hurlbut: Treating Akshaya Toepfer/Extender: [name] Weeks in [name]: 16 Wound Status Wound Number: 3 Primary Diabetic Wound/Ulcer of the Lower Extremity Etiology: Wound Location: Left, Plantar Foot Wound Open Wounding Event: Gradually Appeared Status: Date Acquired: 03/18/2021 Comorbid Anemia, Sleep Apnea, Congestive Heart Failure, Hypertension, Weeks Of [name]: 16 History: Peripheral Venous Disease, Cirrhosis , Type II Diabetes, Clustered Wound: No Osteoarthritis, Neuropathy Wound Measurements Length: (cm) 3.5 Width: (cm) 3.2 Depth: (cm) 0.2 Area: (cm) 8.796 Volume: (cm) 1.759 % Reduction in Area: -24.4% % Reduction in Volume: -148.8% Epithelialization: Small (1-33%) Tunneling: No Undermining: No Wound Description Classification: Grade 3 Wound Margin: Distinct, outline attached Exudate Amount: Large Exudate Type: Serosanguineous Exudate Color: red, brown Foul Odor After Cleansing: No Slough/Fibrino No Wound Bed Granulation Amount: Large (67-100%) Exposed Structure Granulation Quality: Red, Hyper-granulation Fascia Exposed: No Necrotic Amount: None Present (0%) Fat Layer (Subcutaneous Tissue) Exposed: Yes Tendon Exposed: No Muscle Exposed: No Joint Exposed: No Bone Exposed: No [name] Notes Wound #3 (Foot) Wound Laterality: Plantar, Left Cleanser Soap and Water Discharge Instruction: May shower and wash wound with dial antibacterial soap and water prior to dressing change. Wound Cleanser Discharge Instruction: Cleanse the wound with wound cleanser prior to applying a clean dressing using gauze sponges, not tissue or cotton balls. Peri-Wound Care Zinc Oxide Ointment 30g tube Discharge Instruction: Apply Zinc Oxide as needed for any maceration or redness to periwound with each dressing change Sween Lotion (Moisturizing lotion) Discharge Instruction: Apply  moisturizing lotion as directed Topical Primary Dressing KerraCel Ag Gelling Fiber Dressing, 4x5 in (silver alginate) Discharge Instruction: Apply silver alginate to wound bed as instructed Secondary Dressing ABD Pad, 5x9 Discharge Instruction: Apply over primary dressing as directed. Zetuvit Plus 4x8 in Discharge Instruction: Apply over primary dressing as directed. Secured With [employer], 4.5x3.1 (in/yd) Discharge Instruction: Secure with Kerlix as directed. 90M Medipore H Soft Cloth Surgical T ape, 2x2 (in/yd) Discharge Instruction: Secure dressing with tape as directed. Compression Wrap Compression Stockings Add-Ons Electronic Signature(s) Signed: 09/30/2021 4:24:09 PM By: Baruch Gouty RN, BSN Signed: 09/30/2021 5:19:40 PM By: [name] Entered By: [name] on 09/30/2021 14:54:49 -------------------------------------------------------------------------------- Vitals Details Patient Name: Date of Service: [name] IG 09/30/2021 2:00 PM Medical Record Number: [id number] Patient Account Number: [id number] Date of Birth/Sex: Treating RN: December 18, 1960 (60 y.o. [name] Primary Care Marx Doig: [name] Other Clinician: Referring Teniya Filter: Treating Ayaz Sondgeroth/Extender: [name] Weeks in [name]: 16 Vital Signs Time Taken: 14:43 Temperature (F): 98.2 Weight (lbs): 270 Pulse (bpm): 71 Respiratory Rate (breaths/min): 18 Blood Pressure (mmHg): 132/82 Reference Range: 80 - 120 mg / dl Electronic Signature(s) Signed: 09/30/2021 3:58:17 PM By: [name] Entered By: [name] on 09/30/2021 14:43:33

## 2021-09-30 NOTE — Progress Notes (Addendum)
CHAMBERS, [name] ([id number]) Visit Report for 09/30/2021 Chief Complaint Document Details Patient Name: Date of Service: [name], [name] 09/30/2021 2:00 PM Medical Record Number: [id number] Patient Account Number: [id number] Date of Birth/Sex: Treating RN: 02-28-1961 (60 y.o. [name] Primary Care Provider: Billey Gosling Other Clinician: Referring Provider: Treating Provider/Extender: Adele Schilder in Treatment: 16 Information Obtained from: Patient Chief Complaint Bilateral foot diabetic ulcers Electronic Signature(s) Signed: 09/30/2021 2:43:12 PM By: Worthy Keeler PA-C Entered By: Worthy Keeler on 09/30/2021 14:43:11 -------------------------------------------------------------------------------- HPI Details Patient Name: Date of Service: [name] IG 09/30/2021 2:00 PM Medical Record Number: [id number] Patient Account Number: [id number] Date of Birth/Sex: Treating RN: 1960/11/06 (60 y.o. [name] Primary Care Provider: Billey Gosling Other Clinician: Referring Provider: Treating Provider/Extender: Adele Schilder in Treatment: 16 History of Present Illness HPI Description: 10/31/2019 upon evaluation today patient appears to be doing somewhat poorly in regard to his bilateral plantar feet. He has wounds that he tells me have been present since 2012 intermittently off and on. Most recently this has been open for at least the past 6 months to a year. He has been trying to treat this in different ways using Santyl along with various other dressings including Medihoney and even at one point Xeroform. Nothing really has seem to get this completely closed. He was recently in the hospital for cellulitis of his leg subsequently he did have x-rays as well as MRIs that showed negative for any signs of osteomyelitis in regard to the wounds on his feet. Fortunately there is no signs of systemic infection at this time. No fevers, chills,  nausea, vomiting, or diarrhea. Patient has previously used Darco offloading shoes as far as frontal floaters as well as postop surgical shoes. He has never been in a total contact cast that may be something we need to strongly consider here. Patient's most recent hemoglobin A1c 1 month ago was 5.3 seems to be very well controlled which is great. Subsequently he has seen vascular as well as podiatry. His ABIs are 1.07 on the left and 1.14 on the right he seems to be doing well he does have chronic venous stasis. 11/07/2019 upon evaluation today patient appears to be doing well with regard to his wounds all things considered. I do not see any severe worsening he still has some callus buildup on the right more than the left he notes that he has been probably more active than he should as far as walking is concerned is just very hard to not be active. He knows he needs to be more careful in this regard however. He is willing to give the cast a try at this point although he notes that he is a little nervous about this just with regard to balance although he will be very careful and obviously if he has any trouble he knows to contact the office and let me know. 1/22; patient is in for his obligatory first total contact cast change. Our intake nurse reported a very large amount of drainage which is spelled out over to the surrounding skin. Has bilateral diabetic foot wounds. He has Charcot feet. We have been using silver alginate on his wounds. 11/14/2019 on evaluation today patient is actually seeming to make good improvement in regard to his bilateral plantar foot wounds. We have been using a cast on the left side and on the right side he has been using dressings he is changing up his own accord.  With that being said he tells me that he is also not walking as much just due to how unsteady he feels. He takes it easy when he does have to walk and when he does not have to walk he is resting. This is  probably help in his right foot as well has the left foot which is actually measuring better. In fact both are measuring better. Overall I am very pleased with how things seem to be progressing. The patient does have some odor on the left foot this does have me concerned about the possibility of infection, and actually probably go ahead and put him on antibiotics today as well as utilizing a continuation of the cast on the left foot I think that will be fine we probably just need to bring him in sooner to change this not last a whole week. 1/29; we brought the patient back today for a total contact cast change on the left out of concern for excessive drainage. We are using drawtex over the wound as the primary dressing 11/21/2019 on evaluation today patient appears to be doing well with regard to his left plantar foot. In fact both foot ulcers actually seem to be doing pretty well. Nonetheless he is having a lot of drainage on the left at this time and again we did obtain a wound culture did show positive for Staph aureus that was reviewed by myself today as well. Nonetheless he is on Bactrim which was shown to be sensitive that should be helping in this regard. Fortunately there is no signs of infection systemically at this point. 2/5; back in clinic today for a total contact cast change apparently secondary to very significant drainage. Still using drawtex 11/28/2019 upon evaluation today patient appears to be doing well with regard to his wounds. The right foot is doing okay as measured about the same in my opinion. The left foot is actually showing signs of significant improvement is measuring smaller there is a lot of hyper granulation likely due to the continued drainage at this point. We did obtain approval for a snap VAC I think that is good to be appropriate for him and will likely help this tremendously underneath the cast. He is definitely in agreement with proceeding with such. 2/12; patient  came in today after his snap VAC lost suction. Brought in to see one of our nurses. The dressing was replaced and then we put the cast back on and rehooked up the snap VAC. Apparently his wound looked very good per our intake nurse. 2/15; again we replaced the cast on Friday. By Saturday the snap VAC and light suction. He called this morning he comes in acutely. The wounds look fine however the VAC is not functioning. We replaced the cast using silver alginate as the primary dressing backed with Kerramax. The snap VAC was not replaced 12/05/2019 upon evaluation today patient appears to be doing better in regard to his left plantar foot ulcer. Fortunately there is no signs of active infection at this time. Unfortunately he is continuing to have issues with the right foot he is really not making any progress here things seem to be somewhat stagnant to be honest. The depth has increased but that is due to me having debrided the wound in the past based on what I am seeing. 12/12/2019 on evaluation today patient appears to be doing more poorly in regard to the left lower extremity. He has some erythema spreading up the side of his foot  I am concerned about infection again at this point. Unfortunately he has been seeing improvement with a total contact cast but I do not think we should put that on today. On his right plantar foot he continues to have significant drainage this is actually measuring deeper I really do not feel like you are making any progress whatsoever. I have prescribed Granix for him unfortunately his insurance apparently was going to cost him a $500 co-pay. 12/19/2019 upon evaluation today patient actually appears to be doing better in regard to both wounds. With that being said he actually did get the reGranix which he had to pay $500 for. With that being said it does look like that he is actually made some improvement based on what I am seeing at this point with the reGranix. Obviously if he  is going to continue this we are going to do something about trying to get him some help in covering the cost. 12/26/2019 on evaluation today patient appears to be doing really much better even compared to last week. Overall the wound seems to be much better even compared to last week and last week was better than the week before. Since has been using the reGranix his symptoms have improved significantly. With that being said the issue right now is simply that this is a very expensive medication for him the first dose cost him $500. Upon inspection patient's wound bed actually is however dramatically improved compared to before he started this 2 weeks ago. 01/02/2020 upon evaluation today patient appears to actually be doing well. He still had a little bit of reGranix left that has been using in small amounts he just been applying it every other day instead of every day in order to make it go longer. Overall we are still seeing excellent improvement he is measuring smaller looking better healthier tissue and everything seems to be pointing to this headed in the right direction. Fortunately there is no evidence of infection either which is also excellent news. He does have his MRI coming up within the next week. 01/09/2020 upon evaluation today patient appears to be doing a little worse today compared to previous week's evaluation. He is actually been out of the reGranix at this point. He has been trying to make the stretch out so he has been changing the dressings on a regular set schedule like he was previous. I think this has made a difference. Fortunately there is no signs of active infection at this time. No fevers, chills, nausea, vomiting, or diarrhea. 01/16/2020 upon evaluation today patient appears to be doing well with regard to his left plantar foot ulcer. The right plantar foot still shows some significant depth at this point. Fortunately there is no signs of active infection at this  time. 01/30/2020 upon evaluation today patient appears to be doing about the same in regard to his right plantar foot ulcer there is still some depth here and we had to wait till he actually switched over to his new insurance to get approval for the MRI under his new insurance plan. With that being said he now has switched as of April 1. Fortunately there is no signs of active infection at this time. Overall in regard to his left foot ulcer this seems to be doing much better and I am actually very pleased with how things are going. With that being said it is not quite as much progress as we were seeing with the reGranix but at the same time he  has had trouble getting this apparently there is been some hindrance here. I Georgina Peer try to actually send this to [facility] that was recommended by the drug rep to me. 02/13/20 upon evaluation today patient appears to be doing better in regard to his left foot ulcer this is great news. Unfortunately the right foot ulcer is not really significantly better at this time. There is no signs of active infection systemically though he did have his MRI which showed unfortunately he does have infection noted including an abscess in the foot. There is also marrow changes noted which are consistent with osteomyelitis based on the radiology review and interpretation. Unfortunately considering that the wound is not really making the progress that we will he would like to be seen I think that this is an indication that he may need some further referral both infectious disease as well as potentially to podiatrist to see if there is anything that can be done to help with the situation that were dealing with here. The left foot again is doing great. READMISSION 06/04/2021 This is a 60 year old man who was in the clinic in 2021 followed by Jeri Cos for areas on the right and left foot. He developed a left foot infection and was referred to ID. He left the clinic in a  nonhealed state and was followed for a period of [facility] Cannon Kettle. Apparently things really deteriorated in early July when he was admitted to hospital from 04/27/2021 through 05/06/2021 with sepsis secondary to a left foot infection. His blood cultures were negative. An MRI suggested fifth metatarsal osteomyelitis a left ankle septic joint. He was treated with vancomycin and ertapenem which he is still taking and may just about be finishing. He was seen by orthopedics and the patient adamantly refused to BKA. As far as I can tell he did not have the ankle aspirated I am not exactly sure what the issue was here. Since he has been discharged she is at [facility] for rehabilitation. He was last seen by Dr. Drucilla Schmidt on 05/20/2021 he noted osteo of the tibial talar bone cuboid and fifth metatarsal which is even more extensive than what was suggested by the MRI. He is apparently going for a consultation with orthopedic surgery in [place] sometime next week. I received a call about this man 2 weeks ago from Dr. Gwenlyn Found who follows him for the possibility of PAD. ABIs I think done in the office showed a ABI on the right of 1.05 at the PTA and 0.99 at the PTA on the left. He had a DVT rule out in the left leg that was negative for the DVT. Past medical history is extensive and includes diastolic heart failure, right first metatarsal head ulcer in 2021, excision of the right second ray by Dr. Cannon Kettle on 03/13/2020, hypertension, hypothyroidism. Left total hip replacement, right total knee replacement, carpal tunnel syndrome, obstructive sleep apnea alcohol abuse with cirrhosis although the patient denies current alcohol intake. The patient does not think he is a diabetic however looking through [facility] I see 2 HgbAic's of this year that were greater than equal to 6.5 which by definition makes him diabetic. Nevertheless he is not on any treatment and does not check his blood  sugars. The patient is now back home out of the nursing home. Saw Dr. Drucilla Schmidt last week he was taken off vancomycin and ertapenem on August 22 and now is on doxycycline on Augmentin. He also saw  Dr. Weston AnnaEllington who is his orthopedic surgeon in [place] he recommended a [employer] [place] boot. He has been using Medihoney. 9/6I have been having trouble getting hyperbarics approved through our prior authorization process. Even though he had a limb threatening infection in the left foot and probably the left ankle there glitches in how some of the reports are worded also some of the consultants. In any case I am going to repeat his sedimentation rate and C-reactive protein. I am generally not in favor of doing things like this as they really do not alter the plan of care from my point of view however I am going to need to demonstrate that these remain high in order to get this through forhyperbaric treatment for chronic refractory osteomyelitis 9/13; following this man for a wound on his left plantar foot in the setting of type 2 diabetes and Charcot deformity. He has underlying chronic refractory osteomyelitis. Follow-up sedimentation rate and C-reactive protein were both elevated but the C-reactive protein was down to 1.4, sedimentation rate at 70. Sedimentation rate was only slightly down from previous at 85. His wound is measuring slightly smaller. 9/20; patient started hyperbaric oxygen therapy today and tolerated treatment well. This is for the underlying osteomyelitis. He remains on antibiotics but thinks he is getting close to finishing. The wound on the plantar aspect of his foot is the other issue we are following here. He is using Medihoney The patient has a Charcot foot in the setting of type 2 diabetes. He is going to need a total contact cast although his partner was away this week and we elected to delay this till next week 9/27; patient still tolerating hyperbaric oxygen well. Wound looked generally  healthy not much depth under illumination still 100% covered in fibrinous debris. Raised callused edges around the wound he was prepared for a total contact cast. We have been using Medihoney 9/30; patient is back for his first obligatory total contact cast change. We are using Hydrofera Blue. Noted by our intake nurse to have a lot of drainage or at least a moderate amount of drainage. I am not sure I was previously aware of this 10/4; patient arrives today with a lot of drainage under the cast. When he had it changed last Friday there was also a similar amount of drainage. Her intake nurse says that they tried a wound VAC on him perhaps while I was on vacation in August under the cast but that did not work. In my experience that has not been unusual. We have been using Hydrofera Blue with all the secondary absorbers. The drainage today went right through all of our dressings. The patient is concerned about his foot being in a cast without much drainage. He is tolerating HBO well. There has been improvements in the wound in the mid part of his foot in the setting of a Charcot deformity 10/7; patient presents for cast change. He has no issues or complaints today. He denies signs of infection. 10/11; patient presents for cast change. At this time he would like to take a break from the cast. He would like to do daily dressing changes with Hydrofera Blue. He currently denies signs of infection. 10/14; his cast was taken off last week at his request. [facility] He has been changing the dressing himself. He has way too much edema in the left foot and leg to consider a total contact cast. I do not really know the issue here. He  does have chronic venous insufficiency His wife stopped me in the clinic earlier in the week to report he is drinking again and she is concerned. I am uncertain whether there are other issues 10/18; he arrived in clinic last week having bilateral  lower extremity edema likely secondary to chronic venous insufficiency there was too much edema in the left leg to apply a total contact cast. I put him in compression on the left leg to control the swelling. This week he cut the wrap on the foot for reasons that are unclear however today he arrives in clinic with a smaller left leg but massive edema in the left foot. He is supposed to be wearing a juxta lite stocking on the right leg but he is not wearing the contact layer. His attendance at hyperbaric oxygen has been dwindling, he did not dive yesterday and he did not dive today concerned about hyperbarics causing swelling. I looked back in his record his last echo was in 2020 essentially normal left and right ventricular function. Last BUN and creatinine were done in July this was normal. He is having a lot of drainage in the left plantar foot wound 10/25; again he comes in today having missed HBO yesterday. Macerated skin around the wound which really does not look very good at all. A lot of edema in the left foot but an improvement in edema on the left leg. We wrapped him last week because of the amount of swelling in the left leg. We could not apply a total contact cast. The patient states that he wants to be able to change his dressing himself. I might consider this if he had stockings to control the swelling. He said he be here for HBO tomorrow. I will check the degree of erythema in his forefoot which I have marked. He is on doxycycline and Augmentin which was renewed by Dr. Drucilla Schmidt but I cannot see a follow-up note, follow-up inflammatory markers etc. I 1 point he said he was going to see his orthopedic surgeon in [place] I am not sure if he ever did this. I do not know why he has not followed up with infectious disease, he says he was not given an appointment but he is still on Augmentin doxycycline 11/1; he did not do the lab work I ordered last week. Still on Augmentin and doxycycline.  Silver alginate and he is changing this daily using his own juxta lite stocking. The surface of the wound does not look too bad. No real epithelialization however. The patient was seen today along with HBO 08/26/2021 upon evaluation today patient is being seen at his request by myself he wanted to transfer care to see me. With that being said he has been seeing Dr. Dellia Nims since he came back in August of this year. Unfortunately he tells me he still having quite a bit of drainage. He is also significant erythema and warmth noted of the foot as well. He has been hit or miss with regard to hyperbaric oxygen therapy tells me that at this point he took this week off because he was extremely claustrophobic and having a lot of issues he plans to start back next week. Nonetheless he has been given some medication by Dr. Dellia Nims as well to help calm things down which again may help him. Hopefully he can get back in hyperbarics as I think this is likely necessary. Nonetheless there does seem to be evidence of cellulitis noted today as well and in  general I am a little concerned in that regard. His wound unfortunately is significant on this left foot and is right where he takes the brunt of the force secondary to the Charcot arthropathy. I do not think a total contact cast is ideal due to the fact that he unfortunately is draining much too significantly he is also not happy with the idea of using a cast therefore he states he would not want to do that anyway. 09/16/2021 upon evaluation today patient's plantar foot ulcer unfortunately continues to show signs of issues here. He has been in the hospital due to trying to detox himself from alcohol he had withdrawal symptoms and subsequently was hospitalized. During that time it was recommended by both orthopedics as well as infectious disease apparently that he proceed with amputation. With that being said they discussed with him the risk of not doing so. This is  well- documented in the encounter. With that being said the patient does not want to proceed down that road and tells me that he declined their advice in that regard. Subsequently our goal then is to try to do what we can to try to get this thing healed and closed. I think this means he is getting need to stay off of it is much as possible he does have a wheelchair at home that he tells me he can use I think that that is going to be something that he does need to do. 09/23/2021 upon evaluation today patient continues to have significant issues here with his foot ulcer which is plantar on the left foot. Subsequently again he does have a history of Charcot foot he also has an issue of having had osteomyelitis as well as an open wound for some time here. Its been recommended multiple times for him to have an amputation although that is not something that he is really interested or wanting to do. 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 Subsequently this means that he also has a gait abnormality that is causing issues with his walking and causing abnormal pressures in the central part of his foot this is the reason he has a wound. Nonetheless I want to see what I can do about trying to get a boot to help with stabilization and offloading of working to see what we can do in that regard. 09/30/2021 upon evaluation today patient appears to be doing well with regard to his plantar foot ulcer. He still has significant granular hypergranulation but again this is much less than what it was even last time I saw him last week. Fortunately I think we are headed in the right direction. This is definitely something we could consider a skin graft or something along those lines if we can get it flattened out enough and get the drainage from being so significant. I feel like we are making some headway here. Electronic Signature(s) Signed: 09/30/2021 3:13:58 PM By: Worthy Keeler PA-C Entered By: Worthy Keeler on 09/30/2021 15:13:58 -------------------------------------------------------------------------------- Chemical Cauterization Details Patient Name: Date of Service: [name], [name] IG 09/30/2021 2:00 PM Medical Record Number: [id number] Patient Account Number: [id number] Date of Birth/Sex: Treating RN: 12/15/1960 (60 y.o. Marcheta Grammes Primary Care Provider: Billey Gosling Other Clinician: Referring Provider: Treating Provider/Extender: Adele Schilder in Treatment: [id number] Procedure Performed for: Wound #3 Left,Plantar Foot Performed By: Physician Worthy Keeler, PA Post Procedure Diagnosis Same as Pre-procedure Electronic Signature(s) Signed: 09/30/2021 3:33:06  PM By: Lenda Kelp PA-C Signed: 09/30/2021 5:19:40 PM By: [name] Entered By: [name] on 09/30/2021 15:05:49 -------------------------------------------------------------------------------- Physical Exam Details Patient Name: Date of Service: [name] 09/30/2021 2:00 PM Medical Record Number: [id number] Patient Account Number: [id number] Date of Birth/Sex: Treating RN: 03-21-1961 (60 y.o. [name] Primary Care Provider: [name] Other Clinician: Referring Provider: Treating Provider/Extender: Atlee Abide Weeks in Treatment: 16 Constitutional Obese and well-hydrated in no acute distress. Respiratory normal breathing without difficulty. Psychiatric this patient is able to make decisions and demonstrates good insight into disease process. Alert and Oriented x 3. pleasant and cooperative. Notes Upon inspection patient's wound bed actually showed signs of good granulation epithelization at this point and he did have some hypergranulation centrally although we are working on this. Electronic Signature(s) Signed: 09/30/2021 3:14:12 PM By: Lenda Kelp PA-C Entered By: Lenda Kelp on 09/30/2021  15:14:12 -------------------------------------------------------------------------------- Physician Orders Details Patient Name: Date of Service: [name], [name] IG 09/30/2021 2:00 PM Medical Record Number: [id number] Patient Account Number: [id number] Date of Birth/Sex: Treating RN: Sep 19, 1961 (60 y.o. [name] Primary Care Provider: [name] Other Clinician: Referring Provider: Treating Provider/Extender: [name] in Treatment: [phone] Verbal / Phone Orders: No Diagnosis Coding ICD-10 Coding Code Description E11.621 Type 2 diabetes mellitus with foot ulcer M86.372 Chronic multifocal osteomyelitis, left ankle and foot L97.528 Non-pressure chronic ulcer of other part of left foot with other specified severity M19.072 Primary osteoarthritis, left ankle and foot M14.672 Charcot's joint, left ankle and foot Follow-up Appointments ppointment in 1 week. - with Leonard Schwartz Return A Bathing/ Shower/ Hygiene May shower and wash wound with soap and water. - prior to dressing change Edema Control - Lymphedema / SCD / Other Elevate legs to the level of the heart or above for 30 minutes daily and/or when sitting, a frequency of: - throughout the day Avoid standing for long periods of time. Exercise regularly Moisturize legs daily. Compression stocking or Garment 30-40 mm/Hg pressure to: - wear the Juxtalite HD to right and left leg. apply in the morning and remove at night. Off-Loading DH Walker Boot to: - Defender off loading boot ordered 09/30/21 Open toe surgical shoe to: - left foot Other: - minimal weight bearing left foot use wheelchair for mobility. Additional Orders / Instructions Follow Nutritious Diet Home Health New wound care orders this week; continue Home Health for wound care. May utilize formulary equivalent dressing for wound treatment orders unless otherwise specified. - [facility] home health weekly and all other days patient to change. Wound  Treatment Wound #3 - Foot Wound Laterality: Plantar, Left Cleanser: Soap and Water (Home Health) 1 x Per Day/15 Days Discharge Instructions: May shower and wash wound with dial antibacterial soap and water prior to dressing change. Cleanser: Wound Cleanser (Home Health) (Generic) 1 x Per Day/15 Days Discharge Instructions: Cleanse the wound with wound cleanser prior to applying a clean dressing using gauze sponges, not tissue or cotton balls. Peri-Wound Care: Zinc Oxide Ointment 30g tube (Home Health) 1 x Per Day/15 Days Discharge Instructions: Apply Zinc Oxide as needed for any maceration or redness to periwound with each dressing change Peri-Wound Care: Sween Lotion (Moisturizing lotion) (Home Health) 1 x Per Day/15 Days Discharge Instructions: Apply moisturizing lotion as directed Prim Dressing: KerraCel Ag Gelling Fiber Dressing, 4x5 in (silver alginate) (Home Health) (Generic) 1 x Per Day/15 Days ary Discharge Instructions: Apply silver alginate to wound bed as instructed Secondary Dressing: ABD Pad,  5x9 (Home Health) (Generic) 1 x Per Day/15 Days Discharge Instructions: Apply over primary dressing as directed. Secondary Dressing: Zetuvit Plus 4x8 in [facility]) (Generic) 1 x Per Day/15 Days Discharge Instructions: Apply over primary dressing as directed. Secured With: [employer], 4.5x3.1 (in/yd) (Home Health) (Generic) 1 x Per Day/15 Days Discharge Instructions: Secure with Kerlix as directed. Secured With: 76M Medipore H Soft Cloth Surgical Tape, 2x2 (in/yd) (Home Health) (Generic) 1 x Per Day/15 Days Discharge Instructions: Secure dressing with tape as directed. Electronic Signature(s) Signed: 09/30/2021 3:33:06 PM By: Worthy Keeler PA-C Signed: 09/30/2021 5:19:40 PM By: Lorrin Jackson Entered By: Lorrin Jackson on 09/30/2021 15:07:00 -------------------------------------------------------------------------------- Problem List Details Patient Name: Date of  Service: EZECHIEL, BENSING IG 09/30/2021 2:00 PM Medical Record Number: [id number] Patient Account Number: [id number] Date of Birth/Sex: Treating RN: 1960-11-07 (60 y.o. [name] Primary Care Provider: Billey Gosling Other Clinician: Referring Provider: Treating Provider/Extender: Adele Schilder in Treatment: [phone] Active Problems ICD-10 Encounter Code Description Active Date MDM Diagnosis E11.621 Type 2 diabetes mellitus with foot ulcer 06/04/2021 No Yes M86.372 Chronic multifocal osteomyelitis, left ankle and foot 06/04/2021 No Yes L97.528 Non-pressure chronic ulcer of other part of left foot with other specified 06/04/2021 No Yes severity M19.072 Primary osteoarthritis, left ankle and foot 09/23/2021 No Yes M14.672 Charcot's joint, left ankle and foot 06/04/2021 No Yes Inactive Problems Resolved Problems Electronic Signature(s) Signed: 09/30/2021 2:43:03 PM By: Worthy Keeler PA-C Entered By: Worthy Keeler on 09/30/2021 14:43:03 -------------------------------------------------------------------------------- Progress Note Details Patient Name: Date of Service: [name], [name] IG 09/30/2021 2:00 PM Medical Record Number: [id number] Patient Account Number: [id number] Date of Birth/Sex: Treating RN: 27-Nov-1960 (60 y.o. [name] Primary Care Provider: Billey Gosling Other Clinician: Referring Provider: Treating Provider/Extender: Adele Schilder in Treatment: 16 Subjective Chief Complaint Information obtained from Patient Bilateral foot diabetic ulcers History of Present Illness (HPI) 10/31/2019 upon evaluation today patient appears to be doing somewhat poorly in regard to his bilateral plantar feet. He has wounds that he tells me have been present since 2012 intermittently off and on. Most recently this has been open for at least the past 6 months to a year. He has been trying to treat this in different ways using Santyl along with  various other dressings including Medihoney and even at one point Xeroform. Nothing really has seem to get this completely closed. He was recently in the hospital for cellulitis of his leg subsequently he did have x-rays as well as MRIs that showed negative for any signs of osteomyelitis in regard to the wounds on his feet. Fortunately there is no signs of systemic infection at this time. No fevers, chills, nausea, vomiting, or diarrhea. Patient has previously used Darco offloading shoes as far as frontal floaters as well as postop surgical shoes. He has never been in a total contact cast that may be something we need to strongly consider here. Patient's most recent hemoglobin A1c 1 month ago was 5.3 seems to be very well controlled which is great. Subsequently he has seen vascular as well as podiatry. His ABIs are 1.07 on the left and 1.14 on the right he seems to be doing well he does have chronic venous stasis. 11/07/2019 upon evaluation today patient appears to be doing well with regard to his wounds all things considered. I do not see any severe worsening he still has some callus buildup on the right more than the left he notes that he  has been probably more active than he should as far as walking is concerned is just very hard to not be active. He knows he needs to be more careful in this regard however. He is willing to give the cast a try at this point although he notes that he is a little nervous about this just with regard to balance although he will be very careful and obviously if he has any trouble he knows to contact the office and let me know. 1/22; patient is in for his obligatory first total contact cast change. Our intake nurse reported a very large amount of drainage which is spelled out over to the surrounding skin. Has bilateral diabetic foot wounds. He has Charcot feet. We have been using silver alginate on his wounds. 11/14/2019 on evaluation today patient is actually seeming to  make good improvement in regard to his bilateral plantar foot wounds. We have been using a cast on the left side and on the right side he has been using dressings he is changing up his own accord. With that being said he tells me that he is also not walking as much just due to how unsteady he feels. He takes it easy when he does have to walk and when he does not have to walk he is resting. This is probably help in his right foot as well has the left foot which is actually measuring better. In fact both are measuring better. Overall I am very pleased with how things seem to be progressing. The patient does have some odor on the left foot this does have me concerned about the possibility of infection, and actually probably go ahead and put him on antibiotics today as well as utilizing a continuation of the cast on the left foot I think that will be fine we probably just need to bring him in sooner to change this not last a whole week. 1/29; we brought the patient back today for a total contact cast change on the left out of concern for excessive drainage. We are using drawtex over the wound as the primary dressing 11/21/2019 on evaluation today patient appears to be doing well with regard to his left plantar foot. In fact both foot ulcers actually seem to be doing pretty well. Nonetheless he is having a lot of drainage on the left at this time and again we did obtain a wound culture did show positive for Staph aureus that was reviewed by myself today as well. Nonetheless he is on Bactrim which was shown to be sensitive that should be helping in this regard. Fortunately there is no signs of infection systemically at this point. 2/5; back in clinic today for a total contact cast change apparently secondary to very significant drainage. Still using drawtex 11/28/2019 upon evaluation today patient appears to be doing well with regard to his wounds. The right foot is doing okay as measured about the same in  my opinion. The left foot is actually showing signs of significant improvement is measuring smaller there is a lot of hyper granulation likely due to the continued drainage at this point. We did obtain approval for a snap VAC I think that is good to be appropriate for him and will likely help this tremendously underneath the cast. He is definitely in agreement with proceeding with such. 2/12; patient came in today after his snap VAC lost suction. Brought in to see one of our nurses. The dressing was replaced and then we put the cast  back on and rehooked up the snap VAC. Apparently his wound looked very good per our intake nurse. 2/15; again we replaced the cast on Friday. By Saturday the snap VAC and light suction. He called this morning he comes in acutely. The wounds look fine however the VAC is not functioning. We replaced the cast using silver alginate as the primary dressing backed with Kerramax. The snap VAC was not replaced 12/05/2019 upon evaluation today patient appears to be doing better in regard to his left plantar foot ulcer. Fortunately there is no signs of active infection at this time. Unfortunately he is continuing to have issues with the right foot he is really not making any progress here things seem to be somewhat stagnant to be honest. The depth has increased but that is due to me having debrided the wound in the past based on what I am seeing. 12/12/2019 on evaluation today patient appears to be doing more poorly in regard to the left lower extremity. He has some erythema spreading up the side of his foot I am concerned about infection again at this point. Unfortunately he has been seeing improvement with a total contact cast but I do not think we should put that on today. On his right plantar foot he continues to have significant drainage this is actually measuring deeper I really do not feel like you are making any progress whatsoever. I have prescribed Granix for him  unfortunately his insurance apparently was going to cost him a $500 co-pay. 12/19/2019 upon evaluation today patient actually appears to be doing better in regard to both wounds. With that being said he actually did get the reGranix which he had to pay $500 for. With that being said it does look like that he is actually made some improvement based on what I am seeing at this point with the reGranix. Obviously if he is going to continue this we are going to do something about trying to get him some help in covering the cost. 12/26/2019 on evaluation today patient appears to be doing really much better even compared to last week. Overall the wound seems to be much better even compared to last week and last week was better than the week before. Since has been using the reGranix his symptoms have improved significantly. With that being said the issue right now is simply that this is a very expensive medication for him the first dose cost him $500. Upon inspection patient's wound bed actually is however dramatically improved compared to before he started this 2 weeks ago. 01/02/2020 upon evaluation today patient appears to actually be doing well. He still had a little bit of reGranix left that has been using in small amounts he just been applying it every other day instead of every day in order to make it go longer. Overall we are still seeing excellent improvement he is measuring smaller looking better healthier tissue and everything seems to be pointing to this headed in the right direction. Fortunately there is no evidence of infection either which is also excellent news. He does have his MRI coming up within the next week. 01/09/2020 upon evaluation today patient appears to be doing a little worse today compared to previous week's evaluation. He is actually been out of the reGranix at this point. He has been trying to make the stretch out so he has been changing the dressings on a regular set schedule like he  was previous. I think this has made a difference. Fortunately there  is no signs of active infection at this time. No fevers, chills, nausea, vomiting, or diarrhea. 01/16/2020 upon evaluation today patient appears to be doing well with regard to his left plantar foot ulcer. The right plantar foot still shows some significant depth at this point. Fortunately there is no signs of active infection at this time. 01/30/2020 upon evaluation today patient appears to be doing about the same in regard to his right plantar foot ulcer there is still some depth here and we had to wait till he actually switched over to his new insurance to get approval for the MRI under his new insurance plan. With that being said he now has switched as of April 1. Fortunately there is no signs of active infection at this time. Overall in regard to his left foot ulcer this seems to be doing much better and I am actually very pleased with how things are going. With that being said it is not quite as much progress as we were seeing with the reGranix but at the same time he has had trouble getting this apparently there is been some hindrance here. I Georgina Peer try to actually send this to [facility] that was recommended by the drug rep to me. 02/13/20 upon evaluation today patient appears to be doing better in regard to his left foot ulcer this is great news. Unfortunately the right foot ulcer is not really significantly better at this time. There is no signs of active infection systemically though he did have his MRI which showed unfortunately he does have infection noted including an abscess in the foot. There is also marrow changes noted which are consistent with osteomyelitis based on the radiology review and interpretation. Unfortunately considering that the wound is not really making the progress that we will he would like to be seen I think that this is an indication that he may need some further referral both infectious disease as  well as potentially to podiatrist to see if there is anything that can be done to help with the situation that were dealing with here. The left foot again is doing great. READMISSION 06/04/2021 This is a 60 year old man who was in the clinic in 2021 followed by Jeri Cos for areas on the right and left foot. He developed a left foot infection and was referred to ID. He left the clinic in a nonhealed state and was followed for a period of [facility] Cannon Kettle. Apparently things really deteriorated in early July when he was admitted to hospital from 04/27/2021 through 05/06/2021 with sepsis secondary to a left foot infection. His blood cultures were negative. An MRI suggested fifth metatarsal osteomyelitis a left ankle septic joint. He was treated with vancomycin and ertapenem which he is still taking and may just about be finishing. He was seen by orthopedics and the patient adamantly refused to BKA. As far as I can tell he did not have the ankle aspirated I am not exactly sure what the issue was here. Since he has been discharged she is at [facility] for rehabilitation. He was last seen by Dr. Drucilla Schmidt on 05/20/2021 he noted osteo of the tibial talar bone cuboid and fifth metatarsal which is even more extensive than what was suggested by the MRI. He is apparently going for a consultation with orthopedic surgery in [place] sometime next week. I received a call about this man 2 weeks ago from Dr. Gwenlyn Found who follows him for the possibility of PAD. ABIs I  think done in the office showed a ABI on the right of 1.05 at the PTA and 0.99 at the PTA on the left. He had a DVT rule out in the left leg that was negative for the DVT. Past medical history is extensive and includes diastolic heart failure, right first metatarsal head ulcer in 2021, excision of the right second ray by Dr. Cannon Kettle on 03/13/2020, hypertension, hypothyroidism. Left total hip replacement, right total knee  replacement, carpal tunnel syndrome, obstructive sleep apnea alcohol abuse with cirrhosis although the patient denies current alcohol intake. The patient does not think he is a diabetic however looking through [place] link I see 2 HgbAic's of this year that were greater than equal to 6.5 which by definition makes him diabetic. Nevertheless he is not on any treatment and does not check his blood sugars. The patient is now back home out of the nursing home. Saw Dr. Drucilla Schmidt last week he was taken off vancomycin and ertapenem on August 22 and now is on doxycycline on Augmentin. He also saw Dr. Buford Dresser who is his orthopedic surgeon in [place] he recommended a [employer]. He has been using Medihoney. 9/6I have been having trouble getting hyperbarics approved through our prior authorization process. Even though he had a limb threatening infection in the left foot and probably the left ankle there glitches in how some of the reports are worded also some of the consultants. In any case I am going to repeat his sedimentation rate and C-reactive protein. I am generally not in favor of doing things like this as they really do not alter the plan of care from my point of view however I am going to need to demonstrate that these remain high in order to get this through forhyperbaric treatment for chronic refractory osteomyelitis 9/13; following this man for a wound on his left plantar foot in the setting of type 2 diabetes and Charcot deformity. He has underlying chronic refractory osteomyelitis. Follow-up sedimentation rate and C-reactive protein were both elevated but the C-reactive protein was down to 1.4, sedimentation rate at 70. Sedimentation rate was only slightly down from previous at 85. His wound is measuring slightly smaller. 9/20; patient started hyperbaric oxygen therapy today and tolerated treatment well. This is for the underlying osteomyelitis. He remains on antibiotics but thinks he is getting  close to finishing. The wound on the plantar aspect of his foot is the other issue we are following here. He is using Medihoney The patient has a Charcot foot in the setting of type 2 diabetes. He is going to need a total contact cast although his partner was away this week and we elected to delay this till next week 9/27; patient still tolerating hyperbaric oxygen well. Wound looked generally healthy not much depth under illumination still 100% covered in fibrinous debris. Raised callused edges around the wound he was prepared for a total contact cast. We have been using Medihoney 9/30; patient is back for his first obligatory total contact cast change. We are using Hydrofera Blue. Noted by our intake nurse to have a lot of drainage or at least a moderate amount of drainage. I am not sure I was previously aware of this 10/4; patient arrives today with a lot of drainage under the cast. When he had it changed last Friday there was also a similar amount of drainage. Her intake nurse says that they tried a wound VAC on him perhaps while I was on vacation in August under the cast  but that did not work. In my experience that has not been unusual. We have been using Hydrofera Blue with all the secondary absorbers. The drainage today went right through all of our dressings. The patient is concerned about his foot being in a cast without much drainage. He is tolerating HBO well. There has been improvements in the wound in the mid part of his foot in the setting of a Charcot deformity 10/7; patient presents for cast change. He has no issues or complaints today. He denies signs of infection. 10/11; patient presents for cast change. At this time he would like to take a break from the cast. He would like to do daily dressing changes with Hydrofera Blue. He currently denies signs of infection. 10/14; his cast was taken off last week at his request. He arrives in the clinic with [facility]. He has been changing  the dressing himself. He has way too much edema in the left foot and leg to consider a total contact cast. I do not really know the issue here. He does have chronic venous insufficiency His wife stopped me in the clinic earlier in the week to report he is drinking again and she is concerned. I am uncertain whether there are other issues 10/18; he arrived in clinic last week having bilateral lower extremity edema likely secondary to chronic venous insufficiency there was too much edema in the left leg to apply a total contact cast. I put him in compression on the left leg to control the swelling. This week he cut the wrap on the foot for reasons that are unclear however today he arrives in clinic with a smaller left leg but massive edema in the left foot. He is supposed to be wearing a juxta lite stocking on the right leg but he is not wearing the contact layer. His attendance at hyperbaric oxygen has been dwindling, he did not dive yesterday and he did not dive today concerned about hyperbarics causing swelling. I looked back in his record his last echo was in 2020 essentially normal left and right ventricular function. Last BUN and creatinine were done in July this was normal. He is having a lot of drainage in the left plantar foot wound 10/25; again he comes in today having missed HBO yesterday. Macerated skin around the wound which really does not look very good at all. A lot of edema in the left foot but an improvement in edema on the left leg. We wrapped him last week because of the amount of swelling in the left leg. We could not apply a total contact cast. The patient states that he wants to be able to change his dressing himself. I might consider this if he had stockings to control the swelling. He said he be here for HBO tomorrow. I will check the degree of erythema in his forefoot which I have marked. He is on doxycycline and Augmentin which was renewed by Dr. Drucilla Schmidt but I cannot see a  follow-up note, follow-up inflammatory markers etc. I 1 point he said he was going to see his orthopedic surgeon in [place] I am not sure if he ever did this. I do not know why he has not followed up with infectious disease, he says he was not given an appointment but he is still on Augmentin doxycycline 11/1; he did not do the lab work I ordered last week. Still on Augmentin and doxycycline. Silver alginate and he is changing this daily using his own juxta  lite stocking. The surface of the wound does not look too bad. No real epithelialization however. The patient was seen today along with HBO 08/26/2021 upon evaluation today patient is being seen at his request by myself he wanted to transfer care to see me. With that being said he has been seeing Dr. Dellia Nims since he came back in August of this year. Unfortunately he tells me he still having quite a bit of drainage. He is also significant erythema and warmth noted of the foot as well. He has been hit or miss with regard to hyperbaric oxygen therapy tells me that at this point he took this week off because he was extremely claustrophobic and having a lot of issues he plans to start back next week. Nonetheless he has been given some medication by Dr. Dellia Nims as well to help calm things down which again may help him. Hopefully he can get back in hyperbarics as I think this is likely necessary. Nonetheless there does seem to be evidence of cellulitis noted today as well and in general I am a little concerned in that regard. His wound unfortunately is significant on this left foot and is right where he takes the brunt of the force secondary to the Charcot arthropathy. I do not think a total contact cast is ideal due to the fact that he unfortunately is draining much too significantly he is also not happy with the idea of using a cast therefore he states he would not want to do that anyway. 09/16/2021 upon evaluation today patient's plantar foot ulcer  unfortunately continues to show signs of issues here. He has been in the hospital due to trying to detox himself from alcohol he had withdrawal symptoms and subsequently was hospitalized. During that time it was recommended by both orthopedics as well as infectious disease apparently that he proceed with amputation. With that being said they discussed with him the risk of not doing so. This is well- documented in the encounter. With that being said the patient does not want to proceed down that road and tells me that he declined their advice in that regard. Subsequently our goal then is to try to do what we can to try to get this thing healed and closed. I think this means he is getting need to stay off of it is much as possible he does have a wheelchair at home that he tells me he can use I think that that is going to be something that he does need to do. 09/23/2021 upon evaluation today patient continues to have significant issues here with his foot ulcer which is plantar on the left foot. Subsequently again he does have a history of Charcot foot he also has an issue of having had osteomyelitis as well as an open wound for some time here. Its been recommended multiple times for him to have an amputation although that is not something that he is really interested or wanting to do. 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 Subsequently this means that he also has a gait abnormality that is causing issues with his walking and causing abnormal pressures in the central part of his foot this is the reason he has a wound. Nonetheless I want to see what I can do about trying to get a boot to help with stabilization and offloading of working to see what we can do in that regard. 09/30/2021 upon evaluation today patient appears to be doing  well with regard to his plantar foot ulcer. He still has significant granular hypergranulation but again this is much less  than what it was even last time I saw him last week. Fortunately I think we are headed in the right direction. This is definitely something we could consider a skin graft or something along those lines if we can get it flattened out enough and get the drainage from being so significant. I feel like we are making some headway here. Objective Constitutional Obese and well-hydrated in no acute distress. Vitals Time Taken: 2:43 PM, Weight: 270 lbs, Temperature: 98.2 F, Pulse: 71 bpm, Respiratory Rate: 18 breaths/min, Blood Pressure: 132/82 mmHg. Respiratory normal breathing without difficulty. Psychiatric this patient is able to make decisions and demonstrates good insight into disease process. Alert and Oriented x 3. pleasant and cooperative. General Notes: Upon inspection patient's wound bed actually showed signs of good granulation epithelization at this point and he did have some hypergranulation centrally although we are working on this. Integumentary (Hair, Skin) Wound #3 status is Open. Original cause of wound was Gradually Appeared. The date acquired was: 03/18/2021. The wound has been in treatment 16 weeks. The wound is located on the [place]. The wound measures 3.5cm length x 3.2cm width x 0.2cm depth; 8.796cm^2 area and 1.759cm^3 volume. There is Fat Layer (Subcutaneous Tissue) exposed. There is no tunneling or undermining noted. There is a large amount of serosanguineous drainage noted. The wound margin is distinct with the outline attached to the wound base. There is large (67-100%) red, hyper - granulation within the wound bed. There is no necrotic tissue within the wound bed. Assessment Active Problems ICD-10 Type 2 diabetes mellitus with foot ulcer Chronic multifocal osteomyelitis, left ankle and foot Non-pressure chronic ulcer of other part of left foot with other specified severity Primary osteoarthritis, left ankle and foot Charcot's joint, left ankle and  foot Procedures Wound #3 Pre-procedure diagnosis of Wound #3 is a Diabetic Wound/Ulcer of the Lower Extremity located on the Left,Plantar Foot . An Chemical Cauterization procedure was performed by Worthy Keeler, PA. Post procedure Diagnosis Wound #3: Same as Pre-Procedure Plan Follow-up Appointments: Return Appointment in 1 week. - with Glynn Octave Shower/ Hygiene: May shower and wash wound with soap and water. - prior to dressing change Edema Control - Lymphedema / SCD / Other: Elevate legs to the level of the heart or above for 30 minutes daily and/or when sitting, a frequency of: - throughout the day Avoid standing for long periods of time. Exercise regularly Moisturize legs daily. Compression stocking or Garment 30-40 mm/Hg pressure to: - wear the Juxtalite HD to right and left leg. apply in the morning and remove at night. Off-Loading: DH Walker Boot to: - Defender off loading boot ordered 09/30/21 Open toe surgical shoe to: - left foot Other: - minimal weight bearing left foot use wheelchair for mobility. Additional Orders / Instructions: Follow Nutritious Diet Home Health: New wound care orders this week; continue Home Health for wound care. May utilize formulary equivalent dressing for wound treatment orders unless otherwise specified. - [facility] weekly and all other days patient to change. WOUND #3: - Foot Wound Laterality: Plantar, Left Cleanser: Soap and Water (Home Health) 1 x Per [id number] Days Discharge Instructions: May shower and wash wound with dial antibacterial soap and water prior to dressing change. Cleanser: Wound Cleanser (Home Health) (Generic) 1 x Per Day/15 Days Discharge Instructions: Cleanse the wound with wound cleanser prior to applying a clean  dressing using gauze sponges, not tissue or cotton balls. Peri-Wound Care: Zinc Oxide Ointment 30g tube (Home Health) 1 x Per Day/15 Days Discharge Instructions: Apply Zinc Oxide as needed for any  maceration or redness to periwound with each dressing change Peri-Wound Care: Sween Lotion (Moisturizing lotion) (Home Health) 1 x Per Day/15 Days Discharge Instructions: Apply moisturizing lotion as directed Prim Dressing: KerraCel Ag Gelling Fiber Dressing, 4x5 in (silver alginate) (Home Health) (Generic) 1 x Per Day/15 Days ary Discharge Instructions: Apply silver alginate to wound bed as instructed Secondary Dressing: ABD Pad, 5x9 (Home Health) (Generic) 1 x Per Day/15 Days Discharge Instructions: Apply over primary dressing as directed. Secondary Dressing: Zetuvit Plus 4x8 in [facility]) (Generic) 1 x Per Day/15 Days Discharge Instructions: Apply over primary dressing as directed. Secured With: [employer], 4.5x3.1 (in/yd) (Home Health) (Generic) 1 x Per Day/15 Days Discharge Instructions: Secure with Kerlix as directed. Secured With: 32M Medipore H Soft Cloth Surgical T ape, 2x2 (in/yd) (Home Health) (Generic) 1 x Per Day/15 Days Discharge Instructions: Secure dressing with tape as directed. 1. I am going to recommend currently that we continue with the silver nitrate I did repeat that today I feel like slowly were getting things better in that regard. 2. I am also can recommend that we still try to see about getting this offloading boot. Unfortunately the patient does have physical therapy at home that means that they are probably can have to order this apparently his therapist got a message from somebody about that. Nonetheless I think that existing here is can be have his therapist contact us in order to discuss what is going on in this regard. 3. I am also can recommend that we have the patient continue with a border foam dressing to cover that as Zetuvit after applying the silver alginate this is still I think the best option currently. Again he is draining much too much for total contact cast at this point. We will see patient back for reevaluation in 1 week here in the  clinic. If anything worsens or changes patient will contact our office for additional recommendations. Electronic Signature(s) Signed: 09/30/2021 3:15:10 PM By: Worthy Keeler PA-C Entered By: Worthy Keeler on 09/30/2021 15:15:10 -------------------------------------------------------------------------------- SuperBill Details Patient Name: Date of Service: [name], [name] IG 09/30/2021 Medical Record Number: [id number] Patient Account Number: [id number] Date of Birth/Sex: Treating RN: 08-10-1961 (60 y.o. Marcheta Grammes Primary Care Provider: Billey Gosling Other Clinician: Referring Provider: Treating Provider/Extender: Adele Schilder in Treatment: 16 Diagnosis Coding ICD-10 Codes Code Description E11.621 Type 2 diabetes mellitus with foot ulcer M86.372 Chronic multifocal osteomyelitis, left ankle and foot L97.528 Non-pressure chronic ulcer of other part of left foot with other specified severity M19.072 Primary osteoarthritis, left ankle and foot M14.672 Charcot's joint, left ankle and foot Facility Procedures CPT4 Code: [id number] Description: [id number] - CHEM CAUT GRANULATION TISS ICD-10 Diagnosis Description L97.528 Non-pressure chronic ulcer of other part of left foot with other specified sever Modifier: ity Quantity: 1 Physician Procedures : CPT4 Code Description Modifier [id number] - WC PHYS LEVEL 4 - EST PT 25 ICD-10 Diagnosis Description E11.621 Type 2 diabetes mellitus with foot ulcer M86.372 Chronic multifocal osteomyelitis, left ankle and foot L97.528 Non-pressure chronic ulcer of  other part of left foot with other specified severity M19.072 Primary osteoarthritis, left ankle and foot Quantity: 1 : [id number] - WC PHYS CHEM CAUT GRAN TISSUE ICD-10 Diagnosis Description ICD-10 Diagnosis Description L97.528 Non-pressure chronic ulcer of  other part of left foot with other specified severity Quantity: 1 Electronic Signature(s) Signed: 09/30/2021  3:15:25 PM By: Worthy Keeler PA-C Entered By: Worthy Keeler on 09/30/2021 15:15:25

## 2021-09-30 NOTE — Telephone Encounter (Signed)
Patient called advised the pharmacy need prior approval before they will fill Rx Methocarbamol and Diclofenac.   The number to contact patient is [phone]

## 2021-09-30 NOTE — Telephone Encounter (Signed)
1.Medication Requested: meloxicam (MOBIC) 15 MG tablet oxyCODONE (ROXICODONE) 5 MG immediate release tablet   2. Pharmacy (Name, Street, [place]): [id number] - [place], [place] - [address]  3. On Med List: yes  4. Last Visit with PCP: 09-09-2021  5. Next visit date with PCP: 12-11-2021  Patient wants to inform provider he can not be schedule for a mri for 5 weeks due to insurance

## 2021-10-01 ENCOUNTER — Other Ambulatory Visit: Payer: Self-pay | Admitting: Physician Assistant

## 2021-10-01 MED ORDER — MELOXICAM 15 MG PO TABS: 15.0000 mg | ORAL_TABLET | Freq: Every day | ORAL | 0 refills | Status: DC

## 2021-10-01 NOTE — Telephone Encounter (Signed)
Can you RF Methocarmabol.

## 2021-10-01 NOTE — Telephone Encounter (Signed)
Lets hold off on the oxycodone but I will renew the meloxicam for now.

## 2021-10-02 ENCOUNTER — Other Ambulatory Visit: Payer: Self-pay | Admitting: Physician Assistant

## 2021-10-02 MED ORDER — METHOCARBAMOL 500 MG PO TABS
500.0000 mg | ORAL_TABLET | Freq: Two times a day (BID) | ORAL | 0 refills | Status: DC | PRN
Start: 2021-10-02 — End: 2021-10-24

## 2021-10-02 NOTE — Telephone Encounter (Signed)
Sent in

## 2021-10-02 NOTE — Telephone Encounter (Signed)
Patient checking status of request, informed the patient the oxycodone was discontinued  Patient requesting a call back to discuss why medication was discontinued as well as discussing alternative pain medications

## 2021-10-04 NOTE — Telephone Encounter (Signed)
Oxycodone is highly addictive and should not be taken too long especially with his history

## 2021-10-05 NOTE — Telephone Encounter (Signed)
Message left for patient today.  If he calls back okay to relay message and let me know if he has any questions or concerns.

## 2021-10-05 NOTE — Telephone Encounter (Signed)
Patient inquiring is there another pain medication that can be prescribed  Patient is requesting a call back

## 2021-10-06 ENCOUNTER — Ambulatory Visit (INDEPENDENT_AMBULATORY_CARE_PROVIDER_SITE_OTHER): Payer: Medicare Other | Admitting: *Deleted

## 2021-10-06 DIAGNOSIS — G621 Alcoholic polyneuropathy: Secondary | ICD-10-CM

## 2021-10-06 DIAGNOSIS — L97502 Non-pressure chronic ulcer of other part of unspecified foot with fat layer exposed: Secondary | ICD-10-CM

## 2021-10-06 DIAGNOSIS — I5032 Chronic diastolic (congestive) heart failure: Secondary | ICD-10-CM

## 2021-10-06 MED ORDER — OXYCODONE HCL 5 MG PO TABS: 5.0000 mg | ORAL_TABLET | Freq: Three times a day (TID) | ORAL | 0 refills | Status: DC | PRN

## 2021-10-06 NOTE — Addendum Note (Signed)
Addended by: [name] on: 10/06/2021 11:42 AM   Modules accepted: Orders

## 2021-10-06 NOTE — Telephone Encounter (Signed)
If he is taking the diclofenac he should stop the meloxicam.  He can only take one of those - they are the same type of medication.  Take off whatever medication he wishes to not continue from med list.   I will prescribe some pain medication for brief time period.  He needs to set up a f/u with ortho.  [facility].     He also needs to f/u with GI for his liver - does he need a new referral?  Does he want to go to [facility]?

## 2021-10-06 NOTE — Addendum Note (Signed)
Addended by: [name] on: 10/06/2021 06:15 AM   Modules accepted: Orders

## 2021-10-06 NOTE — Addendum Note (Signed)
Addended by: Karma Ganja on: 10/06/2021 09:50 AM   Modules accepted: Orders

## 2021-10-07 ENCOUNTER — Encounter (HOSPITAL_BASED_OUTPATIENT_CLINIC_OR_DEPARTMENT_OTHER): Payer: Medicare Other | Admitting: Physician Assistant

## 2021-10-07 ENCOUNTER — Other Ambulatory Visit: Payer: Self-pay

## 2021-10-07 DIAGNOSIS — E1169 Type 2 diabetes mellitus with other specified complication: Secondary | ICD-10-CM | POA: Diagnosis not present

## 2021-10-07 NOTE — Progress Notes (Addendum)
[name], [name] ([id number]) Visit Report for 10/07/2021 Chief Complaint Document Details Patient Name: Date of Service: [name], [name] 10/07/2021 12:45 PM Medical Record Number: [id number] Patient Account Number: [id number] Date of Birth/Sex: Treating RN: 06/01/1961 (60 y.o. [name] Primary Care Provider: Billey Gosling Other Clinician: Referring Provider: Treating Provider/Extender: Adele Schilder in Treatment: 17 Information Obtained from: Patient Chief Complaint Bilateral foot diabetic ulcers Electronic Signature(s) Signed: 10/07/2021 1:28:02 PM By: Worthy Keeler PA-C Entered By: Worthy Keeler on 10/07/2021 13:28:02 -------------------------------------------------------------------------------- Debridement Details Patient Name: Date of Service: [name], [name] [name] 10/07/2021 12:45 PM Medical Record Number: [id number] Patient Account Number: [id number] Date of Birth/Sex: Treating RN: July 15, 1961 (60 y.o. [name] Primary Care Provider: Billey Gosling Other Clinician: Referring Provider: Treating Provider/Extender: Adele Schilder in Treatment: 17 Debridement Performed for Assessment: Wound #3 Left,Plantar Foot Performed By: Physician Worthy Keeler, PA Debridement Type: Debridement Severity of Tissue Pre Debridement: Fat layer exposed Level of Consciousness (Pre-procedure): Awake and Alert Pre-procedure Verification/Time Out Yes - 13:35 Taken: Start Time: 13:36 Pain Control: Other : benzocaine 20% T Area Debrided (L x W): otal 3.9 (cm) x 3.5 (cm) = 13.65 (cm) Tissue and other material debrided: Viable, Non-Viable, Callus, Subcutaneous, Skin: Dermis , Skin: Epidermis, Fibrin/Exudate, Hyper-granulation Level: Skin/Subcutaneous Tissue Debridement Description: Excisional Instrument: Curette Bleeding: Minimum Hemostasis Achieved: Silver Nitrate End Time: 13:41 Procedural Pain: 0 Post Procedural Pain: 0 Response to  Treatment: Procedure was tolerated well Level of Consciousness (Post- Awake and Alert procedure): Post Debridement Measurements of Total Wound Length: (cm) 3.7 Width: (cm) 3.2 Depth: (cm) 0.3 Volume: (cm) 2.79 Character of Wound/Ulcer Post Debridement: Improved Severity of Tissue Post Debridement: Fat layer exposed Post Procedure Diagnosis Same as Pre-procedure Electronic Signature(s) Signed: 10/07/2021 5:18:31 PM By: Worthy Keeler PA-C Signed: 10/07/2021 6:10:28 PM By: Deon Pilling RN, BSN Entered By: Deon Pilling on 10/07/2021 13:42:04 -------------------------------------------------------------------------------- HPI Details Patient Name: Date of Service: [name] 10/07/2021 12:45 PM Medical Record Number: [id number] Patient Account Number: [id number] Date of Birth/Sex: Treating RN: 09-01-1961 (60 y.o. [name] Primary Care Provider: Billey Gosling Other Clinician: Referring Provider: Treating Provider/Extender: Adele Schilder in Treatment: 17 History of Present Illness HPI Description: 10/31/2019 upon evaluation today patient appears to be doing somewhat poorly in regard to his bilateral plantar feet. He has wounds that he tells me have been present since 2012 intermittently off and on. Most recently this has been open for at least the past 6 months to a year. He has been trying to treat this in different ways using Santyl along with various other dressings including Medihoney and even at one point Xeroform. Nothing really has seem to get this completely closed. He was recently in the hospital for cellulitis of his leg subsequently he did have x-rays as well as MRIs that showed negative for any signs of osteomyelitis in regard to the wounds on his feet. Fortunately there is no signs of systemic infection at this time. No fevers, chills, nausea, vomiting, or diarrhea. Patient has previously used Darco offloading shoes as far as frontal  floaters as well as postop surgical shoes. He has never been in a total contact cast that may be something we need to strongly consider here. Patient's most recent hemoglobin A1c 1 month ago was 5.3 seems to be very well controlled which is great. Subsequently he has seen vascular as well as podiatry. His ABIs are 1.07 on the left and 1.14  on the right he seems to be doing well he does have chronic venous stasis. 11/07/2019 upon evaluation today patient appears to be doing well with regard to his wounds all things considered. I do not see any severe worsening he still has some callus buildup on the right more than the left he notes that he has been probably more active than he should as far as walking is concerned is just very hard to not be active. He knows he needs to be more careful in this regard however. He is willing to give the cast a try at this point although he notes that he is a little nervous about this just with regard to balance although he will be very careful and obviously if he has any trouble he knows to contact the office and let me know. 1/22; patient is in for his obligatory first total contact cast change. Our intake nurse reported a very large amount of drainage which is spelled out over to the surrounding skin. Has bilateral diabetic foot wounds. He has Charcot feet. We have been using silver alginate on his wounds. 11/14/2019 on evaluation today patient is actually seeming to make good improvement in regard to his bilateral plantar foot wounds. We have been using a cast on the left side and on the right side he has been using dressings he is changing up his own accord. With that being said he tells me that he is also not walking as much just due to how unsteady he feels. He takes it easy when he does have to walk and when he does not have to walk he is resting. This is probably help in his right foot as well has the left foot which is actually measuring better. In fact both are  measuring better. Overall I am very pleased with how things seem to be progressing. The patient does have some odor on the left foot this does have me concerned about the possibility of infection, and actually probably go ahead and put him on antibiotics today as well as utilizing a continuation of the cast on the left foot I think that will be fine we probably just need to bring him in sooner to change this not last a whole week. 1/29; we brought the patient back today for a total contact cast change on the left out of concern for excessive drainage. We are using drawtex over the wound as the primary dressing 11/21/2019 on evaluation today patient appears to be doing well with regard to his left plantar foot. In fact both foot ulcers actually seem to be doing pretty well. Nonetheless he is having a lot of drainage on the left at this time and again we did obtain a wound culture did show positive for Staph aureus that was reviewed by myself today as well. Nonetheless he is on Bactrim which was shown to be sensitive that should be helping in this regard. Fortunately there is no signs of infection systemically at this point. 2/5; back in clinic today for a total contact cast change apparently secondary to very significant drainage. Still using drawtex 11/28/2019 upon evaluation today patient appears to be doing well with regard to his wounds. The right foot is doing okay as measured about the same in my opinion. The left foot is actually showing signs of significant improvement is measuring smaller there is a lot of hyper granulation likely due to the continued drainage at this point. We did obtain approval for a snap VAC I  think that is good to be appropriate for him and will likely help this tremendously underneath the cast. He is definitely in agreement with proceeding with such. 2/12; patient came in today after his snap VAC lost suction. Brought in to see one of our nurses. The dressing was replaced  and then we put the cast back on and rehooked up the snap VAC. Apparently his wound looked very good per our intake nurse. 2/15; again we replaced the cast on Friday. By Saturday the snap VAC and light suction. He called this morning he comes in acutely. The wounds look fine however the VAC is not functioning. We replaced the cast using silver alginate as the primary dressing backed with Kerramax. The snap VAC was not replaced 12/05/2019 upon evaluation today patient appears to be doing better in regard to his left plantar foot ulcer. Fortunately there is no signs of active infection at this time. Unfortunately he is continuing to have issues with the right foot he is really not making any progress here things seem to be somewhat stagnant to be honest. The depth has increased but that is due to me having debrided the wound in the past based on what I am seeing. 12/12/2019 on evaluation today patient appears to be doing more poorly in regard to the left lower extremity. He has some erythema spreading up the side of his foot I am concerned about infection again at this point. Unfortunately he has been seeing improvement with a total contact cast but I do not think we should put that on today. On his right plantar foot he continues to have significant drainage this is actually measuring deeper I really do not feel like you are making any progress whatsoever. I have prescribed Granix for him unfortunately his insurance apparently was going to cost him a $500 co-pay. 12/19/2019 upon evaluation today patient actually appears to be doing better in regard to both wounds. With that being said he actually did get the reGranix which he had to pay $500 for. With that being said it does look like that he is actually made some improvement based on what I am seeing at this point with the reGranix. Obviously if he is going to continue this we are going to do something about trying to get him some help in covering the  cost. 12/26/2019 on evaluation today patient appears to be doing really much better even compared to last week. Overall the wound seems to be much better even compared to last week and last week was better than the week before. Since has been using the reGranix his symptoms have improved significantly. With that being said the issue right now is simply that this is a very expensive medication for him the first dose cost him $500. Upon inspection patient's wound bed actually is however dramatically improved compared to before he started this 2 weeks ago. 01/02/2020 upon evaluation today patient appears to actually be doing well. He still had a little bit of reGranix left that has been using in small amounts he just been applying it every other day instead of every day in order to make it go longer. Overall we are still seeing excellent improvement he is measuring smaller looking better healthier tissue and everything seems to be pointing to this headed in the right direction. Fortunately there is no evidence of infection either which is also excellent news. He does have his MRI coming up within the next week. 01/09/2020 upon evaluation today patient appears to  be doing a little worse today compared to previous week's evaluation. He is actually been out of the reGranix at this point. He has been trying to make the stretch out so he has been changing the dressings on a regular set schedule like he was previous. I think this has made a difference. Fortunately there is no signs of active infection at this time. No fevers, chills, nausea, vomiting, or diarrhea. 01/16/2020 upon evaluation today patient appears to be doing well with regard to his left plantar foot ulcer. The right plantar foot still shows some significant depth at this point. Fortunately there is no signs of active infection at this time. 01/30/2020 upon evaluation today patient appears to be doing about the same in regard to his right plantar foot  ulcer there is still some depth here and we had to wait till he actually switched over to his new insurance to get approval for the MRI under his new insurance plan. With that being said he now has switched as of April 1. Fortunately there is no signs of active infection at this time. Overall in regard to his left foot ulcer this seems to be doing much better and I am actually very pleased with how things are going. With that being said it is not quite as much progress as we were seeing with the reGranix but at the same time he has had trouble getting this apparently there is been some hindrance here. I Georgina Peer try to actually send this to [facility] that was recommended by the drug rep to me. 02/13/20 upon evaluation today patient appears to be doing better in regard to his left foot ulcer this is great news. Unfortunately the right foot ulcer is not really significantly better at this time. There is no signs of active infection systemically though he did have his MRI which showed unfortunately he does have infection noted including an abscess in the foot. There is also marrow changes noted which are consistent with osteomyelitis based on the radiology review and interpretation. Unfortunately considering that the wound is not really making the progress that we will he would like to be seen I think that this is an indication that he may need some further referral both infectious disease as well as potentially to podiatrist to see if there is anything that can be done to help with the situation that were dealing with here. The left foot again is doing great. READMISSION 06/04/2021 This is a 60 year old man who was in the clinic in 2021 followed by Jeri Cos for areas on the right and left foot. He developed a left foot infection and was referred to ID. He left the clinic in a nonhealed state and was followed for a [facility] of [facility] Cannon Kettle. Apparently things  really deteriorated in early July when he was admitted to hospital from 04/27/2021 through 05/06/2021 with sepsis secondary to a left foot infection. His blood cultures were negative. An MRI suggested fifth metatarsal osteomyelitis a left ankle septic joint. He was treated with vancomycin and ertapenem which he is still taking and may just about be finishing. He was seen by orthopedics and the patient adamantly refused to BKA. As far as I can tell he did not have the ankle aspirated I am not exactly sure what the issue was here. Since he has been discharged she is at [facility] for rehabilitation. He was last seen by Dr. Drucilla Schmidt on 05/20/2021 he noted osteo of the  tibial talar bone cuboid and fifth metatarsal which is even more extensive than what was suggested by the MRI. He is apparently going for a consultation with orthopedic surgery in [place] sometime next week. I received a call about this man 2 weeks ago from Dr. Gwenlyn Found who follows him for the possibility of PAD. ABIs I think done in the office showed a ABI on the right of 1.05 at the PTA and 0.99 at the PTA on the left. He had a DVT rule out in the left leg that was negative for the DVT. Past medical history is extensive and includes diastolic heart failure, right first metatarsal head ulcer in 2021, excision of the right second ray by Dr. Cannon Kettle on 03/13/2020, hypertension, hypothyroidism. Left total hip replacement, right total knee replacement, carpal tunnel syndrome, obstructive sleep apnea alcohol abuse with cirrhosis although the patient denies current alcohol intake. The patient does not think he is a diabetic however looking through [facility] link I see 2 HgbAic's of this year that were greater than equal to 6.5 which by definition makes him diabetic. Nevertheless he is not on any treatment and does not check his blood sugars. The patient is now back home out of the nursing home. Saw Dr. Drucilla Schmidt last week he was taken off  vancomycin and ertapenem on August 22 and now is on doxycycline on Augmentin. He also saw Dr. Buford Dresser who is his orthopedic surgeon in [place] he recommended a [employer]. He has been using Medihoney. 9/6I have been having trouble getting hyperbarics approved through our prior authorization process. Even though he had a limb threatening infection in the left foot and probably the left ankle there glitches in how some of the reports are worded also some of the consultants. In any case I am going to repeat his sedimentation rate and C-reactive protein. I am generally not in favor of doing things like this as they really do not alter the plan of care from my point of view however I am going to need to demonstrate that these remain high in order to get this through forhyperbaric treatment for chronic refractory osteomyelitis 9/13; following this man for a wound on his left plantar foot in the setting of type 2 diabetes and Charcot deformity. He has underlying chronic refractory osteomyelitis. Follow-up sedimentation rate and C-reactive protein were both elevated but the C-reactive protein was down to 1.4, sedimentation rate at 70. Sedimentation rate was only slightly down from previous at 85. His wound is measuring slightly smaller. 9/20; patient started hyperbaric oxygen therapy today and tolerated treatment well. This is for the underlying osteomyelitis. He remains on antibiotics but thinks he is getting close to finishing. The wound on the plantar aspect of his foot is the other issue we are following here. He is using Medihoney The patient has a Charcot foot in the setting of type 2 diabetes. He is going to need a total contact cast although his partner was away this week and we elected to delay this till next week 9/27; patient still tolerating hyperbaric oxygen well. Wound looked generally healthy not much depth under illumination still 100% covered in fibrinous debris. Raised callused edges  around the wound he was prepared for a total contact cast. We have been using Medihoney 9/30; patient is back for his first obligatory total contact cast change. We are using Hydrofera Blue. Noted by our intake nurse to have a lot of drainage or at least a moderate amount of drainage. I am not  sure I was previously aware of this 10/4; patient arrives today with a lot of drainage under the cast. When he had it changed last Friday there was also a similar amount of drainage. Her intake nurse says that they tried a wound VAC on him perhaps while I was on vacation in August under the cast but that did not work. In my experience that has not been unusual. We have been using Hydrofera Blue with all the secondary absorbers. The drainage today went right through all of our dressings. The patient is concerned about his foot being in a cast without much drainage. He is tolerating HBO well. There has been improvements in the wound in the mid part of his foot in the setting of a Charcot deformity 10/7; patient presents for cast change. He has no issues or complaints today. He denies signs of infection. 10/11; patient presents for cast change. At this time he would like to take a break from the cast. He would like to do daily dressing changes with Hydrofera Blue. He currently denies signs of infection. 10/14; his cast was taken off last week at his request. He arrives in the clinic with [facility]. He has been changing the dressing himself. He has way too much edema in the left foot and leg to consider a total contact cast. I do not really know the issue here. He does have chronic venous insufficiency His wife stopped me in the clinic earlier in the week to report he is drinking again and she is concerned. I am uncertain whether there are other issues 10/18; he arrived in clinic last week having bilateral lower extremity edema likely secondary to chronic venous insufficiency there was too much edema in  the left leg to apply a total contact cast. I put him in compression on the left leg to control the swelling. This week he cut the wrap on the foot for reasons that are unclear however today he arrives in clinic with a smaller left leg but massive edema in the left foot. He is supposed to be wearing a juxta lite stocking on the right leg but he is not wearing the contact layer. His attendance at hyperbaric oxygen has been dwindling, he did not dive yesterday and he did not dive today concerned about hyperbarics causing swelling. I looked back in his record his last echo was in 2020 essentially normal left and right ventricular function. Last BUN and creatinine were done in July this was normal. He is having a lot of drainage in the left plantar foot wound 10/25; again he comes in today having missed HBO yesterday. Macerated skin around the wound which really does not look very good at all. A lot of edema in the left foot but an improvement in edema on the left leg. We wrapped him last week because of the amount of swelling in the left leg. We could not apply a total contact cast. The patient states that he wants to be able to change his dressing himself. I might consider this if he had stockings to control the swelling. He said he be here for HBO tomorrow. I will check the degree of erythema in his forefoot which I have marked. He is on doxycycline and Augmentin which was renewed by Dr. Drucilla Schmidt but I cannot see a follow-up note, follow-up inflammatory markers etc. I 1 point he said he was going to see his orthopedic surgeon in [place] I am not sure if he ever did this. I  do not know why he has not followed up with infectious disease, he says he was not given an appointment but he is still on Augmentin doxycycline 11/1; he did not do the lab work I ordered last week. Still on Augmentin and doxycycline. Silver alginate and he is changing this daily using his own juxta lite stocking. The surface of the  wound does not look too bad. No real epithelialization however. The patient was seen today along with HBO 08/26/2021 upon evaluation today patient is being seen at his request by myself he wanted to transfer care to see me. With that being said he has been seeing Dr. Leanord Hawking since he came back in August of this year. Unfortunately he tells me he still having quite a bit of drainage. He is also significant erythema and warmth noted of the foot as well. He has been hit or miss with regard to hyperbaric oxygen therapy tells me that at this point he took this week off because he was extremely claustrophobic and having a lot of issues he plans to start back next week. Nonetheless he has been given some medication by Dr. Leanord Hawking as well to help calm things down which again may help him. Hopefully he can get back in hyperbarics as I think this is likely necessary. Nonetheless there does seem to be evidence of cellulitis noted today as well and in general I am a little concerned in that regard. His wound unfortunately is significant on this left foot and is right where he takes the brunt of the force secondary to the Charcot arthropathy. I do not think a total contact cast is ideal due to the fact that he unfortunately is draining much too significantly he is also not happy with the idea of using a cast therefore he states he would not want to do that anyway. 09/16/2021 upon evaluation today patient's plantar foot ulcer unfortunately continues to show signs of issues here. He has been in the hospital due to trying to detox himself from alcohol he had withdrawal symptoms and subsequently was hospitalized. During that time it was recommended by both orthopedics as well as infectious disease apparently that he proceed with amputation. With that being said they discussed with him the risk of not doing so. This is well- documented in the encounter. With that being said the patient does not want to proceed down that road  and tells me that he declined their advice in that regard. Subsequently our goal then is to try to do what we can to try to get this thing healed and closed. I think this means he is getting need to stay off of it is much as possible he does have a wheelchair at home that he tells me he can use I think that that is going to be something that he does need to do. 09/23/2021 upon evaluation today patient continues to have significant issues here with his foot ulcer which is plantar on the left foot. Subsequently again he does have a history of Charcot foot he also has an issue of having had osteomyelitis as well as an open wound for some time here. Its been recommended multiple times for him to have an amputation although that is not something that he is really interested or wanting to do. 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 Subsequently this means that he also has a gait abnormality that is causing issues with his walking and causing abnormal  pressures in the central part of his foot this is the reason he has a wound. Nonetheless I want to see what I can do about trying to get a boot to help with stabilization and offloading of working to see what we can do in that regard. 09/30/2021 upon evaluation today patient appears to be doing well with regard to his plantar foot ulcer. He still has significant granular hypergranulation but again this is much less than what it was even last time I saw him last week. Fortunately I think we are headed in the right direction. This is definitely something we could consider a skin graft or something along those lines if we can get it flattened out enough and get the drainage from being so significant. I feel like we are making some headway here. 10/07/2021 upon evaluation today patient appears to be doing decently well in regard to his wound. Fortunately there does not appear to be any signs of infection overall I  feel like it is actually showing some signs of improvement. He still has some hypergranulation but this is much less than its been. Electronic Signature(s) Signed: 10/07/2021 1:51:06 PM By: Worthy Keeler PA-C Entered By: Worthy Keeler on 10/07/2021 13:51:06 -------------------------------------------------------------------------------- Physical Exam Details Patient Name: Date of Service: [name], [name] 10/07/2021 12:45 PM Medical Record Number: [id number] Patient Account Number: [id number] Date of Birth/Sex: Treating RN: 1961/09/27 (60 y.o. [name] Primary Care Provider: Billey Gosling Other Clinician: Referring Provider: Treating Provider/Extender: Landis Martins Weeks in Treatment: 69 Constitutional Well-nourished and well-hydrated in no acute distress. Respiratory normal breathing without difficulty. Psychiatric this patient is able to make decisions and demonstrates good insight into disease process. Alert and Oriented x 3. pleasant and cooperative. Notes Upon inspection patient's wound bed did require some sharp debridement and clearly some of the callus around the edges of the wound. He tolerated that today without complication and postdebridement the wound bed appears to be doing much better. I also did perform debridement over the center part of the wound cleaned away some of the slough and biofilm. I then performed the chemical cauterization to help knock back some hypergranulation he tolerated all that today without complication. Electronic Signature(s) Signed: 10/07/2021 1:51:40 PM By: Worthy Keeler PA-C Entered By: Worthy Keeler on 10/07/2021 13:51:39 -------------------------------------------------------------------------------- Physician Orders Details Patient Name: Date of Service: [name], [name] 10/07/2021 12:45 PM Medical Record Number: [id number] Patient Account Number: [id number] Date of Birth/Sex: Treating RN: 1961/04/18 (60 y.o.  [name] Primary Care Provider: Billey Gosling Other Clinician: Referring Provider: Treating Provider/Extender: Adele Schilder in Treatment: [phone] Verbal / Phone Orders: No Diagnosis Coding ICD-10 Coding Code Description E11.621 Type 2 diabetes mellitus with foot ulcer M86.372 Chronic multifocal osteomyelitis, left ankle and foot L97.528 Non-pressure chronic ulcer of other part of left foot with other specified severity M19.072 Primary osteoarthritis, left ankle and foot M14.672 Charcot's joint, left ankle and foot Follow-up Appointments ppointment in 2 weeks. Margarita Grizzle Wednesday Return A Other: - [facility] ***Defender off loading boot ordered 09/30/21- home health to please try and obtain for patient. Order sent to [facility] medical home health please call them to purchase (order for boot already at [facility] home health to purchase for patient) [phone]. Bathing/ Shower/ Hygiene May shower and wash wound with soap and water. - prior to dressing change Edema Control - Lymphedema / SCD / Other Elevate legs to the level of the heart or above  for 30 minutes daily and/or when sitting, a frequency of: - throughout the day Avoid standing for long periods of time. Exercise regularly Moisturize legs daily. Compression stocking or Garment 30-40 mm/Hg pressure to: - wear the Juxtalite HD to right and left leg. apply in the morning and remove at night. Off-Loading DH Walker Boot to: - Defender off loading boot ordered 09/30/21- home health to please try and obtain for patient. Open toe surgical shoe to: - left foot Other: - minimal weight bearing left foot use wheelchair for mobility. Additional Orders / Instructions Follow [facility] wound care orders this week; continue Home Health for wound care. May utilize formulary equivalent dressing for wound treatment orders unless otherwise specified. - [facility] weekly and all other  days patient to change. ***Defender off loading boot ordered 09/30/21- home health to please try and obtain for patient. Order sent to [facility] medical home health please call them to purchase (order for boot already at [facility] home health to purchase for patient) [phone]. Wound Treatment Wound #3 - Foot Wound Laterality: Plantar, Left Cleanser: Soap and Water (Home Health) 1 x Per Day/15 Days Discharge Instructions: May shower and wash wound with dial antibacterial soap and water prior to dressing change. Cleanser: Wound Cleanser (Home Health) (Generic) 1 x Per Day/15 Days Discharge Instructions: Cleanse the wound with wound cleanser prior to applying a clean dressing using gauze sponges, not tissue or cotton balls. Peri-Wound Care: Zinc Oxide Ointment 30g tube (Home Health) 1 x Per Day/15 Days Discharge Instructions: Apply Zinc Oxide as needed for any maceration or redness to periwound with each dressing change Peri-Wound Care: Sween Lotion (Moisturizing lotion) (Home Health) 1 x Per Day/15 Days Discharge Instructions: Apply moisturizing lotion as directed Prim Dressing: KerraCel Ag Gelling Fiber Dressing, 4x5 in (silver alginate) (Home Health) (Generic) 1 x Per Day/15 Days ary Discharge Instructions: Apply silver alginate to wound bed as instructed Secondary Dressing: ABD Pad, 5x9 (Home Health) (Generic) 1 x Per Day/15 Days Discharge Instructions: Apply over primary dressing as directed. Secondary Dressing: Zetuvit Plus 4x8 in [employer]) (Generic) 1 x Per Day/15 Days Discharge Instructions: Apply over primary dressing as directed. Secured With: [employer], 4.5x3.1 (in/yd) (Home Health) (Generic) 1 x Per Day/15 Days Discharge Instructions: Secure with Kerlix as directed. Secured With: 87M Medipore H Soft Cloth Surgical Tape, 2x2 (in/yd) (Home Health) (Generic) 1 x Per Day/15 Days Discharge Instructions: Secure dressing with tape as directed. Electronic Signature(s) Signed:  10/07/2021 5:18:31 PM By: Worthy Keeler PA-C Signed: 10/07/2021 6:10:28 PM By: Deon Pilling RN, BSN Entered By: Deon Pilling on 10/07/2021 13:50:08 -------------------------------------------------------------------------------- Problem List Details Patient Name: Date of Service: [name], [name] 10/07/2021 12:45 PM Medical Record Number: [id number] Patient Account Number: [id number] Date of Birth/Sex: Treating RN: [birth year] (60 y.o. [name] Primary Care Provider: Billey Gosling Other Clinician: Referring Provider: Treating Provider/Extender: Adele Schilder in Treatment: 17 Active Problems ICD-10 Encounter Code Description Active Date MDM Diagnosis E11.621 Type 2 diabetes mellitus with foot ulcer 06/04/2021 No Yes M86.372 Chronic multifocal osteomyelitis, left ankle and foot 06/04/2021 No Yes L97.528 Non-pressure chronic ulcer of other part of left foot with other specified 06/04/2021 No Yes severity M19.072 Primary osteoarthritis, left ankle and foot 09/23/2021 No Yes M14.672 Charcot's joint, left ankle and foot 06/04/2021 No Yes Inactive Problems Resolved Problems Electronic Signature(s) Signed: 10/07/2021 1:27:52 PM By: Worthy Keeler PA-C Entered By: Worthy Keeler on 10/07/2021 13:27:52 -------------------------------------------------------------------------------- Progress Note  Details Patient Name: Date of Service: [name], [name] 10/07/2021 12:45 PM Medical Record Number: [id number] Patient Account Number: [id number] Date of Birth/Sex: Treating RN: 11-27-1960 (60 y.o. [name] Primary Care Provider: Billey Gosling Other Clinician: Referring Provider: Treating Provider/Extender: Adele Schilder in Treatment: 17 Subjective Chief Complaint Information obtained from Patient Bilateral foot diabetic ulcers History of Present Illness (HPI) 10/31/2019 upon evaluation today patient appears to be doing somewhat poorly  in regard to his bilateral plantar feet. He has wounds that he tells me have been present since 2012 intermittently off and on. Most recently this has been open for at least the past 6 months to a year. He has been trying to treat this in different ways using Santyl along with various other dressings including Medihoney and even at one point Xeroform. Nothing really has seem to get this completely closed. He was recently in the hospital for cellulitis of his leg subsequently he did have x-rays as well as MRIs that showed negative for any signs of osteomyelitis in regard to the wounds on his feet. Fortunately there is no signs of systemic infection at this time. No fevers, chills, nausea, vomiting, or diarrhea. Patient has previously used Darco offloading shoes as far as frontal floaters as well as postop surgical shoes. He has never been in a total contact cast that may be something we need to strongly consider here. Patient's most recent hemoglobin A1c 1 month ago was 5.3 seems to be very well controlled which is great. Subsequently he has seen vascular as well as podiatry. His ABIs are 1.07 on the left and 1.14 on the right he seems to be doing well he does have chronic venous stasis. 11/07/2019 upon evaluation today patient appears to be doing well with regard to his wounds all things considered. I do not see any severe worsening he still has some callus buildup on the right more than the left he notes that he has been probably more active than he should as far as walking is concerned is just very hard to not be active. He knows he needs to be more careful in this regard however. He is willing to give the cast a try at this point although he notes that he is a little nervous about this just with regard to balance although he will be very careful and obviously if he has any trouble he knows to contact the office and let me know. 1/22; patient is in for his obligatory first total contact cast change.  Our intake nurse reported a very large amount of drainage which is spelled out over to the surrounding skin. Has bilateral diabetic foot wounds. He has Charcot feet. We have been using silver alginate on his wounds. 11/14/2019 on evaluation today patient is actually seeming to make good improvement in regard to his bilateral plantar foot wounds. We have been using a cast on the left side and on the right side he has been using dressings he is changing up his own accord. With that being said he tells me that he is also not walking as much just due to how unsteady he feels. He takes it easy when he does have to walk and when he does not have to walk he is resting. This is probably help in his right foot as well has the left foot which is actually measuring better. In fact both are measuring better. Overall I am very pleased with how things seem to be progressing. The  patient does have some odor on the left foot this does have me concerned about the possibility of infection, and actually probably go ahead and put him on antibiotics today as well as utilizing a continuation of the cast on the left foot I think that will be fine we probably just need to bring him in sooner to change this not last a whole week. 1/29; we brought the patient back today for a total contact cast change on the left out of concern for excessive drainage. We are using drawtex over the wound as the primary dressing 11/21/2019 on evaluation today patient appears to be doing well with regard to his left plantar foot. In fact both foot ulcers actually seem to be doing pretty well. Nonetheless he is having a lot of drainage on the left at this time and again we did obtain a wound culture did show positive for Staph aureus that was reviewed by myself today as well. Nonetheless he is on Bactrim which was shown to be sensitive that should be helping in this regard. Fortunately there is no signs of infection systemically at this point. 2/5;  back in clinic today for a total contact cast change apparently secondary to very significant drainage. Still using drawtex 11/28/2019 upon evaluation today patient appears to be doing well with regard to his wounds. The right foot is doing okay as measured about the same in my opinion. The left foot is actually showing signs of significant improvement is measuring smaller there is a lot of hyper granulation likely due to the continued drainage at this point. We did obtain approval for a snap VAC I think that is good to be appropriate for him and will likely help this tremendously underneath the cast. He is definitely in agreement with proceeding with such. 2/12; patient came in today after his snap VAC lost suction. Brought in to see one of our nurses. The dressing was replaced and then we put the cast back on and rehooked up the snap VAC. Apparently his wound looked very good per our intake nurse. 2/15; again we replaced the cast on Friday. By Saturday the snap VAC and light suction. He called this morning he comes in acutely. The wounds look fine however the VAC is not functioning. We replaced the cast using silver alginate as the primary dressing backed with Kerramax. The snap VAC was not replaced 12/05/2019 upon evaluation today patient appears to be doing better in regard to his left plantar foot ulcer. Fortunately there is no signs of active infection at this time. Unfortunately he is continuing to have issues with the right foot he is really not making any progress here things seem to be somewhat stagnant to be honest. The depth has increased but that is due to me having debrided the wound in the past based on what I am seeing. 12/12/2019 on evaluation today patient appears to be doing more poorly in regard to the left lower extremity. He has some erythema spreading up the side of his foot I am concerned about infection again at this point. Unfortunately he has been seeing improvement with a total  contact cast but I do not think we should put that on today. On his right plantar foot he continues to have significant drainage this is actually measuring deeper I really do not feel like you are making any progress whatsoever. I have prescribed Granix for him unfortunately his insurance apparently was going to cost him a $500 co-pay. 12/19/2019 upon evaluation today  patient actually appears to be doing better in regard to both wounds. With that being said he actually did get the reGranix which he had to pay $500 for. With that being said it does look like that he is actually made some improvement based on what I am seeing at this point with the reGranix. Obviously if he is going to continue this we are going to do something about trying to get him some help in covering the cost. 12/26/2019 on evaluation today patient appears to be doing really much better even compared to last week. Overall the wound seems to be much better even compared to last week and last week was better than the week before. Since has been using the reGranix his symptoms have improved significantly. With that being said the issue right now is simply that this is a very expensive medication for him the first dose cost him $500. Upon inspection patient's wound bed actually is however dramatically improved compared to before he started this 2 weeks ago. 01/02/2020 upon evaluation today patient appears to actually be doing well. He still had a little bit of reGranix left that has been using in small amounts he just been applying it every other day instead of every day in order to make it go longer. Overall we are still seeing excellent improvement he is measuring smaller looking better healthier tissue and everything seems to be pointing to this headed in the right direction. Fortunately there is no evidence of infection either which is also excellent news. He does have his MRI coming up within the next week. 01/09/2020 upon evaluation  today patient appears to be doing a little worse today compared to previous week's evaluation. He is actually been out of the reGranix at this point. He has been trying to make the stretch out so he has been changing the dressings on a regular set schedule like he was previous. I think this has made a difference. Fortunately there is no signs of active infection at this time. No fevers, chills, nausea, vomiting, or diarrhea. 01/16/2020 upon evaluation today patient appears to be doing well with regard to his left plantar foot ulcer. The right plantar foot still shows some significant depth at this point. Fortunately there is no signs of active infection at this time. 01/30/2020 upon evaluation today patient appears to be doing about the same in regard to his right plantar foot ulcer there is still some depth here and we had to wait till he actually switched over to his new insurance to get approval for the MRI under his new insurance plan. With that being said he now has switched as of April 1. Fortunately there is no signs of active infection at this time. Overall in regard to his left foot ulcer this seems to be doing much better and I am actually very pleased with how things are going. With that being said it is not quite as much progress as we were seeing with the reGranix but at the same time he has had trouble getting this apparently there is been some hindrance here. I Georgina Peer try to actually send this to [facility] that was recommended by the drug rep to me. 02/13/20 upon evaluation today patient appears to be doing better in regard to his left foot ulcer this is great news. Unfortunately the right foot ulcer is not really significantly better at this time. There is no signs of active infection systemically though he did have his MRI which showed unfortunately  he does have infection noted including an abscess in the foot. There is also marrow changes noted which are consistent with osteomyelitis  based on the radiology review and interpretation. Unfortunately considering that the wound is not really making the progress that we will he would like to be seen I think that this is an indication that he may need some further referral both infectious disease as well as potentially to podiatrist to see if there is anything that can be done to help with the situation that were dealing with here. The left foot again is doing great. READMISSION 06/04/2021 This is a 60 year old man who was in the clinic in 2021 followed by Jeri Cos for areas on the right and left foot. He developed a left foot infection and was referred to ID. He left the clinic in a nonhealed state and was followed for a period of [facility] Cannon Kettle. Apparently things really deteriorated in early July when he was admitted to hospital from 04/27/2021 through 05/06/2021 with sepsis secondary to a left foot infection. His blood cultures were negative. An MRI suggested fifth metatarsal osteomyelitis a left ankle septic joint. He was treated with vancomycin and ertapenem which he is still taking and may just about be finishing. He was seen by orthopedics and the patient adamantly refused to BKA. As far as I can tell he did not have the ankle aspirated I am not exactly sure what the issue was here. Since he has been discharged she is at [facility] for rehabilitation. He was last seen by Dr. Drucilla Schmidt on 05/20/2021 he noted osteo of the tibial talar bone cuboid and fifth metatarsal which is even more extensive than what was suggested by the MRI. He is apparently going for a consultation with orthopedic surgery in Christmas sometime next week. I received a call about this man 2 weeks ago from Dr. Gwenlyn Found who follows him for the possibility of PAD. ABIs I think done in the office showed a ABI on the right of 1.05 at the PTA and 0.99 at the PTA on the left. He had a DVT rule out in the left leg that was negative for the  DVT. Past medical history is extensive and includes diastolic heart failure, right first metatarsal head ulcer in 2021, excision of the right second ray by Dr. Cannon Kettle on 03/13/2020, hypertension, hypothyroidism. Left total hip replacement, right total knee replacement, carpal tunnel syndrome, obstructive sleep apnea alcohol abuse with cirrhosis although the patient denies current alcohol intake. The patient does not think he is a diabetic however looking through [place] link I see 2 HgbAic's of this year that were greater than equal to 6.5 which by definition makes him diabetic. Nevertheless he is not on any treatment and does not check his blood sugars. The patient is now back home out of the nursing home. Saw Dr. Drucilla Schmidt last week he was taken off vancomycin and ertapenem on August 22 and now is on doxycycline on Augmentin. He also saw Dr. Buford Dresser who is his orthopedic surgeon in [place] he recommended a [employer]. He has been using Medihoney. 9/6I have been having trouble getting hyperbarics approved through our prior authorization process. Even though he had a limb threatening infection in the left foot and probably the left ankle there glitches in how some of the reports are worded also some of the consultants. In any case I am going to repeat his sedimentation rate and C-reactive protein. I am generally not  in favor of doing things like this as they really do not alter the plan of care from my point of view however I am going to need to demonstrate that these remain high in order to get this through forhyperbaric treatment for chronic refractory osteomyelitis 9/13; following this man for a wound on his left plantar foot in the setting of type 2 diabetes and Charcot deformity. He has underlying chronic refractory osteomyelitis. Follow-up sedimentation rate and C-reactive protein were both elevated but the C-reactive protein was down to 1.4, sedimentation rate at 70. Sedimentation rate was  only slightly down from previous at 85. His wound is measuring slightly smaller. 9/20; patient started hyperbaric oxygen therapy today and tolerated treatment well. This is for the underlying osteomyelitis. He remains on antibiotics but thinks he is getting close to finishing. The wound on the plantar aspect of his foot is the other issue we are following here. He is using Medihoney The patient has a Charcot foot in the setting of type 2 diabetes. He is going to need a total contact cast although his partner was away this week and we elected to delay this till next week 9/27; patient still tolerating hyperbaric oxygen well. Wound looked generally healthy not much depth under illumination still 100% covered in fibrinous debris. Raised callused edges around the wound he was prepared for a total contact cast. We have been using Medihoney 9/30; patient is back for his first obligatory total contact cast change. We are using Hydrofera Blue. Noted by our intake nurse to have a lot of drainage or at least a moderate amount of drainage. I am not sure I was previously aware of this 10/4; patient arrives today with a lot of drainage under the cast. When he had it changed last Friday there was also a similar amount of drainage. Her intake nurse says that they tried a wound VAC on him perhaps while I was on vacation in August under the cast but that did not work. In my experience that has not been unusual. We have been using Hydrofera Blue with all the secondary absorbers. The drainage today went right through all of our dressings. The patient is concerned about his foot being in a cast without much drainage. He is tolerating HBO well. There has been improvements in the wound in the mid part of his foot in the setting of a Charcot deformity 10/7; patient presents for cast change. He has no issues or complaints today. He denies signs of infection. 10/11; patient presents for cast change. At this time he would like  to take a break from the cast. He would like to do daily dressing changes with Hydrofera Blue. He currently denies signs of infection. 10/14; his cast was taken off last week at his request. He arrives in the clinic with [facility]. He has been changing the dressing himself. He has way too much edema in the left foot and leg to consider a total contact cast. I do not really know the issue here. He does have chronic venous insufficiency His wife stopped me in the clinic earlier in the week to report he is drinking again and she is concerned. I am uncertain whether there are other issues 10/18; he arrived in clinic last week having bilateral lower extremity edema likely secondary to chronic venous insufficiency there was too much edema in the left leg to apply a total contact cast. I put him in compression on the left leg to control the swelling. This  week he cut the wrap on the foot for reasons that are unclear however today he arrives in clinic with a smaller left leg but massive edema in the left foot. He is supposed to be wearing a juxta lite stocking on the right leg but he is not wearing the contact layer. His attendance at hyperbaric oxygen has been dwindling, he did not dive yesterday and he did not dive today concerned about hyperbarics causing swelling. I looked back in his record his last echo was in 2020 essentially normal left and right ventricular function. Last BUN and creatinine were done in July this was normal. He is having a lot of drainage in the left plantar foot wound 10/25; again he comes in today having missed HBO yesterday. Macerated skin around the wound which really does not look very good at all. A lot of edema in the left foot but an improvement in edema on the left leg. We wrapped him last week because of the amount of swelling in the left leg. We could not apply a total contact cast. The patient states that he wants to be able to change his dressing himself. I might  consider this if he had stockings to control the swelling. He said he be here for HBO tomorrow. I will check the degree of erythema in his forefoot which I have marked. He is on doxycycline and Augmentin which was renewed by Dr. Drucilla Schmidt but I cannot see a follow-up note, follow-up inflammatory markers etc. I 1 point he said he was going to see his orthopedic surgeon in [place] I am not sure if he ever did this. I do not know why he has not followed up with infectious disease, he says he was not given an appointment but he is still on Augmentin doxycycline 11/1; he did not do the lab work I ordered last week. Still on Augmentin and doxycycline. Silver alginate and he is changing this daily using his own juxta lite stocking. The surface of the wound does not look too bad. No real epithelialization however. The patient was seen today along with HBO 08/26/2021 upon evaluation today patient is being seen at his request by myself he wanted to transfer care to see me. With that being said he has been seeing Dr. Dellia Nims since he came back in August of this year. Unfortunately he tells me he still having quite a bit of drainage. He is also significant erythema and warmth noted of the foot as well. He has been hit or miss with regard to hyperbaric oxygen therapy tells me that at this point he took this week off because he was extremely claustrophobic and having a lot of issues he plans to start back next week. Nonetheless he has been given some medication by Dr. Dellia Nims as well to help calm things down which again may help him. Hopefully he can get back in hyperbarics as I think this is likely necessary. Nonetheless there does seem to be evidence of cellulitis noted today as well and in general I am a little concerned in that regard. His wound unfortunately is significant on this left foot and is right where he takes the brunt of the force secondary to the Charcot arthropathy. I do not think a total contact cast is  ideal due to the fact that he unfortunately is draining much too significantly he is also not happy with the idea of using a cast therefore he states he would not want to do that anyway. 09/16/2021 upon  evaluation today patient's plantar foot ulcer unfortunately continues to show signs of issues here. He has been in the hospital due to trying to detox himself from alcohol he had withdrawal symptoms and subsequently was hospitalized. During that time it was recommended by both orthopedics as well as infectious disease apparently that he proceed with amputation. With that being said they discussed with him the risk of not doing so. This is well- documented in the encounter. With that being said the patient does not want to proceed down that road and tells me that he declined their advice in that regard. Subsequently our goal then is to try to do what we can to try to get this thing healed and closed. I think this means he is getting need to stay off of it is much as possible he does have a wheelchair at home that he tells me he can use I think that that is going to be something that he does need to do. 09/23/2021 upon evaluation today patient continues to have significant issues here with his foot ulcer which is plantar on the left foot. Subsequently again he does have a history of Charcot foot he also has an issue of having had osteomyelitis as well as an open wound for some time here. Its been recommended multiple times for him to have an amputation although that is not something that he is really interested or wanting to do. 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 Subsequently this means that he also has a gait abnormality that is causing issues with his walking and causing abnormal pressures in the central part of his foot this is the reason he has a wound. Nonetheless I want to see what I can do about trying to get a boot to help with stabilization  and offloading of working to see what we can do in that regard. 09/30/2021 upon evaluation today patient appears to be doing well with regard to his plantar foot ulcer. He still has significant granular hypergranulation but again this is much less than what it was even last time I saw him last week. Fortunately I think we are headed in the right direction. This is definitely something we could consider a skin graft or something along those lines if we can get it flattened out enough and get the drainage from being so significant. I feel like we are making some headway here. 10/07/2021 upon evaluation today patient appears to be doing decently well in regard to his wound. Fortunately there does not appear to be any signs of infection overall I feel like it is actually showing some signs of improvement. He still has some hypergranulation but this is much less than its been. Objective Constitutional Well-nourished and well-hydrated in no acute distress. Vitals Time Taken: 1:25 PM, Weight: 270 lbs, Temperature: 97.6 F, Pulse: 82 bpm, Respiratory Rate: 16 breaths/min, Blood Pressure: 117/75 mmHg. Respiratory normal breathing without difficulty. Psychiatric this patient is able to make decisions and demonstrates good insight into disease process. Alert and Oriented x 3. pleasant and cooperative. General Notes: Upon inspection patient's wound bed did require some sharp debridement and clearly some of the callus around the edges of the wound. He tolerated that today without complication and postdebridement the wound bed appears to be doing much better. I also did perform debridement over the center part of the wound cleaned away some of the slough and biofilm. I then performed the chemical cauterization to help  knock back some hypergranulation he tolerated all that today without complication. Integumentary (Hair, Skin) Wound #3 status is Open. Original cause of wound was Gradually Appeared. The date  acquired was: 03/18/2021. The wound has been in treatment 17 weeks. The wound is located on the Dell. The wound measures 3.7cm length x 3.2cm width x 0.3cm depth; 9.299cm^2 area and 2.79cm^3 volume. There is Fat Layer (Subcutaneous Tissue) exposed. There is no tunneling or undermining noted. There is a large amount of serosanguineous drainage noted. The wound margin is distinct with the outline attached to the wound base. There is large (67-100%) pink, pale granulation within the wound bed. There is no necrotic tissue within the wound bed. General Notes: thin callous periwound. Assessment Active Problems ICD-10 Type 2 diabetes mellitus with foot ulcer Chronic multifocal osteomyelitis, left ankle and foot Non-pressure chronic ulcer of other part of left foot with other specified severity Primary osteoarthritis, left ankle and foot Charcot's joint, left ankle and foot Procedures Wound #3 Pre-procedure diagnosis of Wound #3 is a Diabetic Wound/Ulcer of the Lower Extremity located on the Left,Plantar Foot .Severity of Tissue Pre Debridement is: Fat layer exposed. There was a Excisional Skin/Subcutaneous Tissue Debridement with a total area of 13.65 sq cm performed by Worthy Keeler, PA. With the following instrument(s): Curette to remove Viable and Non-Viable tissue/material. Material removed includes Callus, Subcutaneous Tissue, Skin: Dermis, Skin: Epidermis, Fibrin/Exudate, and Hyper-granulation after achieving pain control using Other (benzocaine 20%). A time out was conducted at 13:35, prior to the start of the procedure. A Minimum amount of bleeding was controlled with Silver Nitrate. The procedure was tolerated well with a pain level of 0 throughout and a pain level of 0 following the procedure. Post Debridement Measurements: 3.7cm length x 3.2cm width x 0.3cm depth; 2.79cm^3 volume. Character of Wound/Ulcer Post Debridement is improved. Severity of Tissue Post Debridement is: Fat  layer exposed. Post procedure Diagnosis Wound #3: Same as Pre-Procedure Plan Follow-up Appointments: Return Appointment in 2 weeks. Margarita Grizzle Wednesday Other: - [facility] ***Defender off loading boot ordered 09/30/21- home health to please try and obtain for patient. Order sent to [facility] medical home health please call them to purchase (order for boot already at [facility] home health to purchase for patient) [phone]. Bathing/ Shower/ Hygiene: May shower and wash wound with soap and water. - prior to dressing change Edema Control - Lymphedema / SCD / Other: Elevate legs to the level of the heart or above for 30 minutes daily and/or when sitting, a frequency of: - throughout the day Avoid standing for long periods of time. Exercise regularly Moisturize legs daily. Compression stocking or Garment 30-40 mm/Hg pressure to: - wear the Juxtalite HD to right and left leg. apply in the morning and remove at night. Off-Loading: DH Walker Boot to: - Defender off loading boot ordered 09/30/21- home health to please try and obtain for patient. Open toe surgical shoe to: - left foot Other: - minimal weight bearing left foot use wheelchair for mobility. Additional Orders / Instructions: Follow Nutritious Diet Home Health: New wound care orders this week; continue Home Health for wound care. May utilize formulary equivalent dressing for wound treatment orders unless otherwise specified. - [facility] weekly and all other days patient to change. ***Defender off loading boot ordered 09/30/21- home health to please try and obtain for patient. Order sent to [facility] medical home health please call them to purchase (order for boot already at [facility] home health to purchase for  patient) [phone]. WOUND #3: - Foot Wound Laterality: Plantar, Left Cleanser: Soap and Water (Home Health) 1 x Per [id number] Days Discharge Instructions: May shower and wash wound with dial antibacterial soap and water  prior to dressing change. Cleanser: Wound Cleanser (Home Health) (Generic) 1 x Per Day/15 Days Discharge Instructions: Cleanse the wound with wound cleanser prior to applying a clean dressing using gauze sponges, not tissue or cotton balls. Peri-Wound Care: Zinc Oxide Ointment 30g tube (Home Health) 1 x Per Day/15 Days Discharge Instructions: Apply Zinc Oxide as needed for any maceration or redness to periwound with each dressing change Peri-Wound Care: Sween Lotion (Moisturizing lotion) (Home Health) 1 x Per Day/15 Days Discharge Instructions: Apply moisturizing lotion as directed Prim Dressing: KerraCel Ag Gelling Fiber Dressing, 4x5 in (silver alginate) (Home Health) (Generic) 1 x Per Day/15 Days ary Discharge Instructions: Apply silver alginate to wound bed as instructed Secondary Dressing: ABD Pad, 5x9 (Home Health) (Generic) 1 x Per Day/15 Days Discharge Instructions: Apply over primary dressing as directed. Secondary Dressing: Zetuvit Plus 4x8 in [facility]) (Generic) 1 x Per Day/15 Days Discharge Instructions: Apply over primary dressing as directed. Secured With: [employer], 4.5x3.1 (in/yd) (Home Health) (Generic) 1 x Per Day/15 Days Discharge Instructions: Secure with Kerlix as directed. Secured With: 103M Medipore H Soft Cloth Surgical T ape, 2x2 (in/yd) (Home Health) (Generic) 1 x Per Day/15 Days Discharge Instructions: Secure dressing with tape as directed. 1. I would recommend currently that we go ahead and continue with the wound care measures as before. I would like for him to have the foot and finger offloading boot although it is good have to be home health to order this were not able to do that on our end because he has home health. We are sending the number for them to call with him today. The order is already been sent to [facility] they just need to call in order to work out payment. 2. I am also can recommend that we have the patient continue with the silver alginate  dressing followed by the ABD pad and Zetuvit to cover and then roll gauze to secure in place. 3. I am also can recommend the patient should continue to elevate his legs much as possible and try to offload as much as he can as well. We will see patient back for reevaluation in 2 weeks here in the clinic. If anything worsens or changes patient will contact our office for additional recommendations. Electronic Signature(s) Signed: 10/07/2021 1:52:44 PM By: Worthy Keeler PA-C Entered By: Worthy Keeler on 10/07/2021 13:52:44 -------------------------------------------------------------------------------- SuperBill Details Patient Name: Date of Service: [name], [name] 10/07/2021 Medical Record Number: [id number] Patient Account Number: [id number] Date of Birth/Sex: Treating RN: 12/21/1960 (60 y.o. [name] Primary Care Provider: Billey Gosling Other Clinician: Referring Provider: Treating Provider/Extender: Adele Schilder in Treatment: 17 Diagnosis Coding ICD-10 Codes Code Description E11.621 Type 2 diabetes mellitus with foot ulcer M86.372 Chronic multifocal osteomyelitis, left ankle and foot L97.528 Non-pressure chronic ulcer of other part of left foot with other specified severity M19.072 Primary osteoarthritis, left ankle and foot M14.672 Charcot's joint, left ankle and foot Facility Procedures CPT4 Code: [id number] Description: [id number] - DEB SUBQ TISSUE 20 SQ CM/< ICD-10 Diagnosis Description L97.528 Non-pressure chronic ulcer of other part of left foot with other specified seve Modifier: rity Quantity: 1 Physician Procedures : CPT4 Code Description Modifier [id number] - WC PHYS SUBQ TISS 20 SQ CM ICD-10 Diagnosis  Description L97.528 Non-pressure chronic ulcer of other part of left foot with other specified severity Quantity: 1 Electronic Signature(s) Signed: 10/07/2021 1:52:58 PM By: Worthy Keeler PA-C Entered By: Worthy Keeler on 10/07/2021  13:52:58

## 2021-10-07 NOTE — Chronic Care Management (AMB) (Signed)
Chronic Care Management   CCM RN Visit Note  10/07/2021 Name: [name] MRN: [id number] DOB: 1961/01/16  Subjective: [name] is a 60 y.o. year old male who is a primary care patient of Burns, Bobette Mo, MD. The care management team was consulted for assistance with disease management and care coordination needs.    Engaged with patient by telephone for follow up visit in response to provider referral for case management and/or care coordination services.   Consent to Services:  The patient was given information about Chronic Care Management services, agreed to services, and gave verbal consent prior to initiation of services.  Please see initial visit note for detailed documentation.  Patient agreed to services and verbal consent obtained.   Assessment: Review of patient past medical history, allergies, medications, health status, including review of consultants reports, laboratory and other test data, was performed as part of comprehensive evaluation and provision of chronic care management services.   SDOH (Social Determinants of Health) assessments and interventions performed:  SDOH Interventions    Flowsheet Row Most Recent Value  SDOH Interventions   Food Insecurity Interventions Intervention Not Indicated  Transportation Interventions Intervention Not Indicated  [Patient drives self occasionally- short distances,  girlfriend drives other times]      CCM Care Plan Allergies  Allergen Reactions   Claritin [Loratadine] Shortness Of Breath and Anxiety   Outpatient Encounter Medications as of 10/06/2021  Medication Sig   acamprosate (CAMPRAL) 333 MG tablet Take 333 mg by mouth 2 (two) times daily.   amLODipine (NORVASC) 5 MG tablet Take 1 tablet (5 mg total) by mouth daily.   amoxicillin-clavulanate (AUGMENTIN) 875-125 MG tablet Take 1 tablet by mouth 2 (two) times daily.   folic acid (FOLVITE) 1 MG tablet Take 1 tablet (1 mg total) by mouth daily.   furosemide (LASIX)  40 MG tablet TAKE 1 TABLET BY MOUTH TWICE A DAY (Patient taking differently: Take 80 mg by mouth 2 (two) times daily.)   gabapentin (NEURONTIN) 300 MG capsule Take 1 capsule (300 mg total) by mouth 2 (two) times daily.   HEMP OIL-VANILLYL BUTYL ETHER EX Apply 1 application topically 2 (two) times daily as needed (arthritis pain).   hydrOXYzine (ATARAX/VISTARIL) 25 MG tablet Take 25 mg by mouth 2 (two) times daily.   levothyroxine (SYNTHROID) 50 MCG tablet Take 50 mcg by mouth daily before breakfast.   LORazepam (ATIVAN) 1 MG tablet Take 1 mg by mouth daily as needed for anxiety.   meloxicam (MOBIC) 15 MG tablet Take 1 tablet (15 mg total) by mouth daily. Take with food   methocarbamol (ROBAXIN) 500 MG tablet Take 1 tablet (500 mg total) by mouth 2 (two) times daily as needed.   metoprolol tartrate (LOPRESSOR) 25 MG tablet Take 1 tablet (25 mg total) by mouth 2 (two) times daily.   Multiple Vitamin (MULTIVITAMIN WITH MINERALS) TABS tablet Take 1 tablet by mouth daily.   oxyCODONE (ROXICODONE) 5 MG immediate release tablet Take 1 tablet (5 mg total) by mouth every 8 (eight) hours as needed (Chronic back pain).   potassium chloride SA (KLOR-CON M20) 20 MEQ tablet Take 3 tablets (60 mEq total) by mouth 2 (two) times daily.   thiamine 100 MG tablet Take 1 tablet (100 mg total) by mouth daily.   traZODone (DESYREL) 50 MG tablet TAKE 1-2 TABLETS (50-100 MG TOTAL) BY MOUTH AT BEDTIME AS NEEDED FOR SLEEP. (Patient taking differently: Take 50-100 mg by mouth See admin instructions. Take  to  by mouth  at bedtime as needed for sleep)   vitamin B-12 (CYANOCOBALAMIN) 100 MCG tablet Take 100 mcg by mouth daily.   [DISCONTINUED] diclofenac (VOLTAREN) 75 MG EC tablet Take 1 tablet (75 mg total) by mouth 2 (two) times daily as needed.   No facility-administered encounter medications on file as of 10/06/2021.   Patient Active Problem List   Diagnosis Date Noted   Chronic bilateral low back pain without  sciatica 09/09/2021   Type 2 diabetes mellitus with foot ulcer (HCC) 09/02/2021   Hypokalemia 09/02/2021   Hypomagnesemia 09/02/2021   Thrombocytopenia (HCC) 09/02/2021   Elevated LFTs 09/02/2021   Alcohol dependence with withdrawal (HCC) 09/01/2021   Critical lower limb ischemia (HCC) 05/29/2021   Alcohol abuse 04/28/2021   Diabetic foot ulcer (HCC) 04/28/2021   Severe sepsis (HCC) 04/28/2021   Osteomyelitis (HCC) 04/27/2021   Anxiety 03/17/2021   Amputated toe of right foot (HCC) 03/03/2020   Alcoholic peripheral neuropathy (HCC) 10/28/2019   Chronic diastolic CHF (congestive heart failure) (HCC) 10/15/2019   Chronic foot ulcer (HCC) 10/15/2019   Alcoholic cirrhosis of liver without ascites (HCC) 04/27/2019   Obesity hypoventilation syndrome (HCC) 04/27/2019   Depression 04/25/2019   Severe alcohol use disorder (HCC)    DOE (dyspnea on exertion) 04/22/2019   Hypothyroidism 04/22/2019   Anemia 02/06/2019   Acute on chronic diastolic CHF (congestive heart failure) (HCC) 01/30/2019   Avascular necrosis of hip, left (HCC) 11/02/2018   Decreased hearing of both ears 10/30/2018   Avascular necrosis of hip, right (HCC) 08/16/2018   Bilateral leg edema 02/27/2018   Marijuana abuse 02/16/2018   Ulcer of left heel (HCC) 12/21/2016   Bilateral carpal tunnel syndrome 12/15/2016   Hyperlipidemia 11/25/2016   Prediabetes 11/25/2016   Hypertension 11/23/2016   History of osteomyelitis 11/23/2016   Morbid obesity (HCC) 11/23/2016   OSA (obstructive sleep apnea) 11/23/2016   Other hammer toe (acquired) 11/11/2013   Exstrophy of bladder 11/11/2013   Conditions to be addressed/monitored:  CHF and chronic wound infection of (L) foot secondary recently amputated toe due to osteomyelitis  Care Plan : RN Care Manager PLan of Care  Updates made by [name] Corner, RN since 10/07/2021 12:00 AM     Problem: Chronic Disease Management Needs   Priority: Medium     Long-Range Goal:  Development of plan of care for long term chronic disease management   Start Date: 06/24/2021  Expected End Date: 06/24/2022  Priority: Medium  Note:   Current Barriers:  Chronic Disease Management support and education needs related to CHF, DMII, and pre- DM ; ongoing complication of wound healing from recent (L) toe amputation Recent hospitalization November 15-19, 2022 for ETOH withdrawal, chronic/ ongoing infection of (L) foot post- toe amputation in July 2022 due to osteomyelitis ETOH dependence: 10/06/21-- patient reports today that he is now re-established with "[facility]" and is followed by addiction team regularly New/ recent falls: non- weight bearing/ limited mobility due to chronic ongoing (L) foot infection: continues using cane/ walker  RNCM Clinical Goal(s):  Patient will demonstrate ongoing health management independence CHF/ pre-DMII; ongoing wound complications  through collaboration with RN Care manager, provider, and care team.   Interventions: 1:1 collaboration with primary care provider regarding development and update of comprehensive plan of care as evidenced by provider attestation and co-signature Inter-disciplinary care team collaboration (see longitudinal plan of care) Evaluation of current treatment plan related to  self management and patient's adherence to plan as established by provider SDOH  updated: patient continues to deny SDOH concerns  Heart Failure Interventions:  (Status: Goal on Track (progressing): YES.) Basic overview and discussion of pathophysiology of Heart Failure reviewed Provided education on low sodium diet Discussed importance of daily weight and advised patient to weigh and record daily Reviewed role of diuretics in prevention of fluid overload and management of heart failure Discussed the importance of keeping all appointments with provider Provided patient with education about the role of exercise in the management  of heart failure Confirmed no current clinical concerns: reiterated previously provided education around self-health management of CHF, action plan for signs/ symptoms yellow CHF zone  Discussed patient's management of back-hip/ leg pain/ discomfort: confirmed referral to pain management provider was placed by orthopedic surgeon Dr. Roda Shutters; patient has not yet heard from pain management team- continues to take prescribed medications as minimally as possible, as he does not wish to develop dependence on opioids- this was encouraged; continues taking Tylenol/ NSAIDS as instructed Confirmed unable to consistently monitor weights at home due to limited mobility/ non-weight bearing on (L) lower extremity: fall risk Reinforced previously provided education around signs/ symptoms CHF yellow zone, along with corresponding action plan: he denies symptoms today; monitors daily assessments at home Confirmed no changes to/ concerns with medications: patient continues to independently self- manage; reports taking diuretics as prescribed Confirmed continues currently not consuming alcohol: states "feels better;" positive reinforcement provided; patient states he "just had a relapse" with recent hospitalization, that he attributes to "everything going on with my health; I just got discouraged and frustrated" Discussed heart healthy, low sodium diet: patient continues trying to follow prescribed diet "as best as I can"  Diabetes:  (Status: 10/06/21-- Patient declined further engagement on this goal.) Lab Results  Component Value Date   HGBA1C 6.2 (H) 09/02/2021  Patient again reports he has never been diagnosed formally with DM: reports "pre-DM;" again confirms not on medication for DM: wonders if "Diabetes" should be on his history in MEDICAL RECORD NUMBERagain encouraged patient to discuss this with PCP Confirmed patient received and is periodically reviewing "Living Well with Diabetes" booklet: I encouraged him to continue  efforts to keep "pre-DM" under control; discussed general guidelines for A1-C results Counseled on importance of regular laboratory monitoring as prescribed;        Review of patient status, including review of consultants reports, relevant laboratory and other test results, and medications completed;        Ongoing wound infection/ complications post- recent (L) toe amputation  (Status:  New goal.)  Long Term Goal Evaluation of current treatment plan related to  wound care/ ongoing infection ,  self-management and patient's adherence to plan as established by provider. Discussed plans with patient for ongoing care management follow up and provided patient with direct contact information for care management team SDOH updated- no concerns identified Reviewed with patient recent wound clinic visits: patient has attended all visits as scheduled; last visit 09/30/21 Confirmed home health team continues visiting patient at home: RN weekly for wound status monitoring; PT twice weekly Discussed recent (newly reported) falls x 2: patient attributes to ongoing limited mobility: tripped once; later fell over threshold in doorway, while using walker; denied injury with both falls: fall prevention discussed, education provided Reviewed recent communications with orthopedic surgeon 12/12-13/ 22: he reports he is waiting for insurance company to "authorize" needed MRI; confirmed Dr. Roda Shutters has ordered MRI/ pain management referral: status currently pending-- encouraged his ongoing engagement/ communication with care team; also encouraged patient  to consider insurance provider to determine status/ benefits/ coverage Reviewed recent ID provider visits: confirms he continues to be on oral antibiotics and "will be" long term, "for several more months;" reports adherence to taking antibiotics as prescribed Reviewed upcoming provider office visits: 10/07/21- wound clinic; 11/18/21- ID provider; 12/11/21- PCP; confirmed patient  aware/ has plans to attend all as scheduled  Patient Goals/Self-Care Activities: As evidenced by review of EHR, collaboration with care team, and patient reporting during CCM RN CM outreach,  Patient Prabhjot will: Take medications as prescribed Attend all scheduled provider appointments Call pharmacy for medication refills Call provider office for new concerns or questions Continue to work with home health team Continue to use assistive devices as indicated to prevent falls at home as his (L) foot wound heals Not consume alcohol Continue to attend wound clinic visits Continue monitoring and writing down on paper his weights at home, 3-4 times per week Continue efforts to follow a heart healthy, low salt, low-carbohydrate diet Continue working with home health team for nursing wound check/ care and PT    Plan: Telephone follow up appointment with care management team member scheduled for:  Monday, December 21, 2021 at 2:15 pm The patient has been provided with contact information for the care management team and has been advised to call with any health related questions or concerns  [name] Pina, RN, BSN, CCRN Alumnus CCM Clinic RN Care Coordination- [facility] [phone]: direct office

## 2021-10-07 NOTE — Progress Notes (Signed)
[name], [name] ([id number]) Visit Report for 10/07/2021 Arrival Information Details Patient Name: Date of Service: [name], [name] 10/07/2021 12:45 PM Medical Record Number: [id number] Patient Account Number: [id number] Date of Birth/Sex: Treating RN: October 16, 1961 (60 y.o. [name] Primary Care Danzel Marszalek: Billey Gosling Other Clinician: Referring Yoni Lobos: Treating Erva Koke/Extender: Adele Schilder in Treatment: 41 Visit Information History Since Last Visit Added or deleted any medications: No Patient Arrived: Walker Any new allergies or adverse reactions: No Arrival Time: 13:25 Had a fall or experienced change in No Accompanied By: self activities of daily living that may affect Transfer Assistance: None risk of falls: Patient Identification Verified: Yes Signs or symptoms of abuse/neglect since No Secondary Verification Process Completed: Yes last visito Patient Requires Transmission-Based Precautions: No Hospitalized since last visit: No Patient Has Alerts: Yes Implantable device outside of the clinic No excluding cellular tissue based products placed in the center since last visit: Has Dressing in Place as Prescribed: Yes Has Footwear/Offloading in Place as Yes Prescribed: Left: Surgical Shoe with Pressure Relief Insole Pain Present Now: Yes Electronic Signature(s) Signed: 10/07/2021 6:10:28 PM By: Deon Pilling RN, BSN Entered By: Deon Pilling on 10/07/2021 13:26:14 -------------------------------------------------------------------------------- Encounter Discharge Information Details Patient Name: Date of Service: [name] 10/07/2021 12:45 PM Medical Record Number: [id number] Patient Account Number: [id number] Date of Birth/Sex: Treating RN: Aug 24, 1961 (60 y.o. [name] Primary Care Shirlee Whitmire: Billey Gosling Other Clinician: Referring [name] [name]: Treating Cassandria Drew/Extender: Adele Schilder in Treatment:  17 Encounter Discharge Information Items Post Procedure Vitals Discharge Condition: Stable Temperature (F): 97.6 Ambulatory Status: Walker Pulse (bpm): 82 Discharge Destination: Home Respiratory Rate (breaths/min): 20 Transportation: Private Auto Blood Pressure (mmHg): 117/75 Accompanied By: self Schedule Follow-up Appointment: Yes Clinical Summary of Care: Electronic Signature(s) Signed: 10/07/2021 6:10:28 PM By: Deon Pilling RN, BSN Entered By: Deon Pilling on 10/07/2021 13:56:05 -------------------------------------------------------------------------------- Lower Extremity Assessment Details Patient Name: Date of Service: RESHAD, SAAB [name] 10/07/2021 12:45 PM Medical Record Number: [id number] Patient Account Number: [id number] Date of Birth/Sex: Treating RN: February 03, 1961 (60 y.o. [name] Primary Care Byrd Terrero: Billey Gosling Other Clinician: Referring Glendal Cassaday: Treating Malerie Eakins/Extender: Landis Martins Weeks in Treatment: 17 Edema Assessment Assessed: [Left: Yes] [Right: No] Edema: [Left: Ye] [Right: s] Calf Left: Right: Point of Measurement: 36 cm From Medial Instep 39 cm Ankle Left: Right: Point of Measurement: 13 cm From Medial Instep 27 cm Vascular Assessment Pulses: Dorsalis Pedis Palpable: [Left:Yes] Electronic Signature(s) Signed: 10/07/2021 6:10:28 PM By: Deon Pilling RN, BSN Entered By: Deon Pilling on 10/07/2021 13:30:42 -------------------------------------------------------------------------------- Multi-Disciplinary Care Plan Details Patient Name: Date of Service: [name] 10/07/2021 12:45 PM Medical Record Number: [id number] Patient Account Number: [id number] Date of Birth/Sex: Treating RN: May 23, 1961 (60 y.o. [name] Primary Care Aodhan Scheidt: Billey Gosling Other Clinician: Referring Zanae Kuehnle: Treating Gomer France/Extender: Adele Schilder in Treatment: [place] reviewed  with physician Active Inactive Nutrition Nursing Diagnoses: Impaired glucose control: actual or potential Potential for alteratiion in Nutrition/Potential for imbalanced nutrition Goals: Patient/caregiver agrees to and verbalizes understanding of need to use nutritional supplements and/or vitamins as prescribed Date Initiated: 06/04/2021 Date Inactivated: 07/07/2021 Target Resolution Date: 07/04/2021 Goal Status: Met Patient/caregiver will maintain therapeutic glucose control Date Initiated: 06/04/2021 Target Resolution Date: 10/28/2021 Goal Status: Active Interventions: Assess HgA1c results as ordered upon admission and as needed Assess patient nutrition upon admission and as needed per policy Provide education on elevated blood sugars and impact on wound healing Notes: 07/07/21:  Glucose control ongoing Electronic Signature(s) Signed: 10/07/2021 6:10:28 PM By: Deon Pilling RN, BSN Entered By: Deon Pilling on 10/07/2021 13:32:32 -------------------------------------------------------------------------------- Pain Assessment Details Patient Name: Date of Service: [name], [name] 10/07/2021 12:45 PM Medical Record Number: [id number] Patient Account Number: [id number] Date of Birth/Sex: Treating RN: [birth year] (60 y.o. [name] Primary Care Acquanetta Cabanilla: Billey Gosling Other Clinician: Referring Lovelace Cerveny: Treating Areg Bialas/Extender: Adele Schilder in Treatment: 17 Active Problems Location of Pain Severity and Description of Pain Patient Has Paino Yes Site Locations Pain Location: Generalized Pain Rate the pain. Current Pain Level: 7 Pain Management and Medication Current Pain Management: Medication: No Cold Application: No Rest: No Massage: No Activity: No T.E.N.S.: No Heat Application: No Leg drop or elevation: No Is the Current Pain Management Adequate: Adequate How does your wound impact your activities of daily livingo Sleep: No Bathing:  No Appetite: No Relationship With Others: No Bladder Continence: No Emotions: No Bowel Continence: No Work: No Toileting: No Drive: No Dressing: No Hobbies: No Engineer, maintenance) Signed: 10/07/2021 6:10:28 PM By: Deon Pilling RN, BSN Entered By: Deon Pilling on 10/07/2021 13:26:39 -------------------------------------------------------------------------------- Patient/Caregiver Education Details Patient Name: Date of Service: [name] 12/21/2022andnbsp12:45 PM Medical Record Number: [id number] Patient Account Number: [id number] Date of Birth/Gender: Treating RN: 05-06-1961 (60 y.o. [name] Primary Care Physician: Billey Gosling Other Clinician: Referring Physician: Treating Physician/Extender: Adele Schilder in Treatment: 17 Education Assessment Education Provided To: Patient Education Topics Provided Elevated Blood Sugar/ Impact on Healing: Handouts: Elevated Blood Sugars: How Do They Affect Wound Healing Methods: Explain/Verbal Responses: Reinforcements needed Electronic Signature(s) Signed: 10/07/2021 6:10:28 PM By: Deon Pilling RN, BSN Entered By: Deon Pilling on 10/07/2021 13:32:48 -------------------------------------------------------------------------------- Wound Assessment Details Patient Name: Date of Service: [name], [name] 10/07/2021 12:45 PM Medical Record Number: [id number] Patient Account Number: [id number] Date of Birth/Sex: Treating RN: 11-21-1960 (60 y.o. [name] Primary Care Tilia Faso: Billey Gosling Other Clinician: Referring Mccartney Chuba: Treating Dugan Vanhoesen/Extender: Landis Martins Weeks in Treatment: 17 Wound Status Wound Number: 3 Primary Diabetic Wound/Ulcer of the Lower Extremity Etiology: Wound Location: Left, Plantar Foot Wound Open Wounding Event: Gradually Appeared Status: Date Acquired: 03/18/2021 Comorbid Anemia, Sleep Apnea, Congestive Heart Failure, Hypertension, Weeks  Of Treatment: 17 History: Peripheral Venous Disease, Cirrhosis , Type II Diabetes, Clustered Wound: No Osteoarthritis, Neuropathy Photos Wound Measurements Length: (cm) 3.7 Width: (cm) 3.2 Depth: (cm) 0.3 Area: (cm) 9.299 Volume: (cm) 2.79 % Reduction in Area: -31.5% % Reduction in Volume: -294.6% Epithelialization: Small (1-33%) Tunneling: No Undermining: No Wound Description Classification: Grade 3 Wound Margin: Distinct, outline attached Exudate Amount: Large Exudate Type: Serosanguineous Exudate Color: red, brown Foul Odor After Cleansing: No Slough/Fibrino No Wound Bed Granulation Amount: Large (67-100%) Exposed Structure Granulation Quality: Pink, Pale Fascia Exposed: No Necrotic Amount: None Present (0%) Fat Layer (Subcutaneous Tissue) Exposed: Yes Tendon Exposed: No Muscle Exposed: No Joint Exposed: No Bone Exposed: No Assessment Notes thin callous periwound. Treatment Notes Wound #3 (Foot) Wound Laterality: Plantar, Left Cleanser Soap and Water Discharge Instruction: May shower and wash wound with dial antibacterial soap and water prior to dressing change. Wound Cleanser Discharge Instruction: Cleanse the wound with wound cleanser prior to applying a clean dressing using gauze sponges, not tissue or cotton balls. Peri-Wound Care Zinc Oxide Ointment 30g tube Discharge Instruction: Apply Zinc Oxide as needed for any maceration or redness to periwound with each dressing change Sween Lotion (Moisturizing lotion) Discharge Instruction: Apply moisturizing lotion as directed Topical  Primary Dressing KerraCel Ag Gelling Fiber Dressing, 4x5 in (silver alginate) Discharge Instruction: Apply silver alginate to wound bed as instructed Secondary Dressing ABD Pad, 5x9 Discharge Instruction: Apply over primary dressing as directed. Zetuvit Plus 4x8 in Discharge Instruction: Apply over primary dressing as directed. Secured With [employer], 4.5x3.1  (in/yd) Discharge Instruction: Secure with Kerlix as directed. 57M Medipore H Soft Cloth Surgical T ape, 2x2 (in/yd) Discharge Instruction: Secure dressing with tape as directed. Compression Wrap Compression Stockings Add-Ons Electronic Signature(s) Signed: 10/07/2021 5:25:22 PM By: Dellie Catholic RN Signed: 10/07/2021 6:10:28 PM By: Deon Pilling RN, BSN Entered By: Dellie Catholic on 10/07/2021 13:32:07 -------------------------------------------------------------------------------- Vitals Details Patient Name: Date of Service: [name], [name] 10/07/2021 12:45 PM Medical Record Number: [id number] Patient Account Number: [id number] Date of Birth/Sex: Treating RN: 04-25-1961 (60 y.o. [name] Primary Care Shanvi Moyd: Billey Gosling Other Clinician: Referring Shaunta Oncale: Treating Quin Mathenia/Extender: Adele Schilder in Treatment: 17 Vital Signs Time Taken: 13:25 Temperature (F): 97.6 Weight (lbs): 270 Pulse (bpm): 82 Respiratory Rate (breaths/min): 16 Blood Pressure (mmHg): 117/75 Reference Range: 80 - 120 mg / dl Electronic Signature(s) Signed: 10/07/2021 6:10:28 PM By: Deon Pilling RN, BSN Entered By: Deon Pilling on 10/07/2021 71:56:64

## 2021-10-07 NOTE — Patient Instructions (Signed)
Visit Information  [name], thank you for taking time to talk with me today. Please don't hesitate to contact me if I can be of assistance to you before our next scheduled telephone appointment.  Below are the goals we discussed today:  Patient Self-Care Activities: Patient [name] will: Take medications as prescribed Attend all scheduled provider appointments Call pharmacy for medication refills Call provider office for new concerns or questions Continue to work with home health team Continue to use assistive devices as indicated to prevent falls at home as his (L) foot wound heals Not consume alcohol Continue to attend wound clinic visits Continue monitoring and writing down on paper his weights at home, 3-4 times per week Continue efforts to follow a heart healthy, low salt, low-carbohydrate diet Continue working with home health team for nursing wound check/ care and PT  Our next scheduled telephone follow up visit/ appointment with care management team member is scheduled on:  Monday, December 21, 2021 at 2:15 pm  pm  If you need to cancel or re-schedule our visit, please call [phone] and our care guide team will be happy to assist you.   I look forward to hearing about your progress.   Caryl Pina, RN, BSN, CCRN [facility] RN Care Coordination- [facility] [phone]: direct office  If you are experiencing a Mental Health or Behavioral Health Crisis or need someone to talk to, please  call the Suicide and Crisis Lifeline: 988 call the [place] National Suicide Prevention Lifeline: 408-377-6317 or TTY: 445-588-9810 TTY (847) 119-5365) to talk to a trained counselor call 1-800-273-TALK (toll free, 24 hour hotline) go to [facility] [address] [place] [phone]) call 911   Patient verbalizes understanding of instructions provided today and agrees to view in MyChart   Alcohol Abuse and Dependence  Information, Adult Alcohol is a widely available drug. People drink alcohol in different amounts. People who drink alcohol very often and in large amounts often have problems during and after drinking. They may develop what is called an alcohol use disorder. There are two main types of alcohol use disorders: Alcohol abuse. This is when you use alcohol too much or too often. You may use alcohol to make yourself feel happy or to reduce stress. You may have a hard time setting a limit on the amount you drink. Alcohol dependence. This is when you use alcohol consistently for a period of time, and your body changes as a result. This can make it hard to stop drinking because you may start to feel sick or feel different when you do not use alcohol. These symptoms are known as withdrawal. How can alcohol abuse and dependence affect me? Alcohol abuse and dependence can have a negative effect on your life. Drinking too much can lead to addiction. You may feel like you need alcohol to function normally. You may drink alcohol before work in the morning, during the day, or as soon as you get home from work in the evening. These actions can result in: Poor work performance. Job loss. Financial problems. Car crashes or criminal charges from driving after drinking alcohol. Problems in your relationships with friends and family. Losing the trust and respect of coworkers, friends, and family. Drinking heavily over a long period of time can permanently damage your body and brain, and can cause lifelong health issues, such as: Damage to your liver or pancreas. Heart problems, high blood pressure, or stroke. Certain cancers. Decreased ability to fight  infections. Brain or nerve damage. Depression. Early (premature) death. If you are careless or you crave alcohol, it is easy to drink more than your body can handle (overdose). Alcohol overdose is a serious situation that requires hospitalization. It may lead to  permanent injuries or death. What can increase my risk? Having a family history of alcohol abuse. Having depression or other mental health conditions. Beginning to drink at an early age. Binge drinking often. Experiencing trauma, stress, and an unstable home life during childhood. Spending time with people who drink often. What actions can I take to prevent or manage alcohol abuse and dependence? Do not drink alcohol if: Your health care provider tells you not to drink. You are pregnant, may be pregnant, or are planning to become pregnant. If you drink alcohol: Limit how much you use to: 0-1 drink a day for women. 0-2 drinks a day for men. Be aware of how much alcohol is in your drink. In the [place] one drink equals one 12 oz bottle of beer (355 mL), one 5 oz glass of wine (148 mL), or one 1 oz glass of hard liquor (44 mL). Stop drinking if you have been drinking too much. This can be very hard to do if you are used to abusing alcohol. If you begin to have withdrawal symptoms, talk with your health care provider or a person that you trust. These symptoms may include anxiety, shaky hands, headache, nausea, sweating, or not being able to sleep. Choose to drink nonalcoholic beverages in social gatherings and places where there may be alcohol. Activity Spend more time on activities that you enjoy that do not involve alcohol, like hobbies or exercise. Find healthy ways to cope with stress, such as exercise, meditation, or spending time with people you care about. General information Talk to your family, coworkers, and friends about supporting you in your efforts to stop drinking. If they drink, ask them not to drink around you. Spend more time with people who do not drink alcohol. If you think that you have an alcohol dependency problem: Tell friends or family about your concerns. Talk with your health care provider or another health professional about where to get help. Work with a Paramedic  and a Network engineer. Consider joining a support group for people who struggle with alcohol abuse and dependence. Where to find support  Your health care provider. SMART Recovery: www.smartrecovery.org Therapy and support groups Local treatment centers or chemical dependency counselors. Local AA groups in your community: SalaryStart.tn Where to find more information Centers for Disease Control and Prevention: FootballExhibition.com.br [employer] on Alcohol Abuse and Alcoholism: BasicStudents.dk Alcoholics Anonymous (AA): SalaryStart.tn Contact a health care provider if: You drank more or for longer than you intended on more than one occasion. You tried to stop drinking or to cut back on how much you drink, but you were not able to. You often drink to the point of vomiting or passing out. You want to drink so badly that you cannot think about anything else. You have problems in your life due to drinking, but you continue to drink. You keep drinking even though you feel anxious, depressed, or have experienced memory loss. You have stopped doing the things you used to enjoy in order to drink. You have to drink more than you used to in order to get the effect you want. You experience anxiety, sweating, nausea, shakiness, and trouble sleeping when you try to stop drinking. Get help right away if: You have thoughts  about hurting yourself or others. You have serious withdrawal symptoms, including: Confusion. Racing heart. High blood pressure. Fever. If you ever feel like you may hurt yourself or others, or have thoughts about taking your own life, get help right away. You can go to your nearest emergency department or call: Your local emergency services (911 in the [place] A suicide crisis helpline, such as the National Suicide Prevention Lifeline at 636 439 8867 or 988 in the [place] This is open 24 hours a day. Summary Alcohol abuse and dependence can have a negative effect on your life.  Drinking too much or too often can lead to addiction. If you drink alcohol, limit how much you use. If you are having trouble keeping your drinking under control, find ways to change your behavior. Hobbies, calming activities, exercise, or support groups can help. If you feel you need help with changing your drinking habits, talk with your health care provider, a good friend, or a therapist, or go to an AA group. This information is not intended to replace advice given to you by your health care provider. Make sure you discuss any questions you have with your health care provider. Document Revised: 04/29/2021 Document Reviewed: 12/12/2018 Elsevier Patient Education  2022 [employer].

## 2021-10-14 ENCOUNTER — Other Ambulatory Visit: Payer: Self-pay | Admitting: Internal Medicine

## 2021-10-15 ENCOUNTER — Encounter: Payer: Self-pay | Admitting: *Deleted

## 2021-10-17 DIAGNOSIS — E119 Type 2 diabetes mellitus without complications: Secondary | ICD-10-CM

## 2021-10-17 DIAGNOSIS — I5032 Chronic diastolic (congestive) heart failure: Secondary | ICD-10-CM | POA: Diagnosis not present

## 2021-10-21 ENCOUNTER — Encounter (HOSPITAL_BASED_OUTPATIENT_CLINIC_OR_DEPARTMENT_OTHER): Payer: Medicare Other | Attending: Physician Assistant | Admitting: Physician Assistant

## 2021-10-21 ENCOUNTER — Other Ambulatory Visit: Payer: Self-pay

## 2021-10-21 DIAGNOSIS — M86372 Chronic multifocal osteomyelitis, left ankle and foot: Secondary | ICD-10-CM | POA: Insufficient documentation

## 2021-10-21 DIAGNOSIS — E11621 Type 2 diabetes mellitus with foot ulcer: Secondary | ICD-10-CM | POA: Insufficient documentation

## 2021-10-21 DIAGNOSIS — M14672 Charcot's joint, left ankle and foot: Secondary | ICD-10-CM | POA: Diagnosis not present

## 2021-10-21 DIAGNOSIS — L97528 Non-pressure chronic ulcer of other part of left foot with other specified severity: Secondary | ICD-10-CM | POA: Insufficient documentation

## 2021-10-21 DIAGNOSIS — M19072 Primary osteoarthritis, left ankle and foot: Secondary | ICD-10-CM | POA: Diagnosis not present

## 2021-10-21 NOTE — Progress Notes (Addendum)
[name], [name] ([id number]) Visit Report for 10/21/2021 Chief Complaint Document Details Patient Name: Date of Service: [name], [name] 10/21/2021 2:30 PM Medical Record Number: [id number] Patient Account Number: [id number] Date of Birth/Sex: Treating RN: February 01, 1961 (61 y.o. [name] Primary Care Provider: Billey Gosling Other Clinician: Referring Provider: Treating Provider/Extender: Adele Schilder in Treatment: 19 Information Obtained from: Patient Chief Complaint Bilateral foot diabetic ulcers Electronic Signature(s) Signed: 10/21/2021 2:44:25 PM By: Worthy Keeler PA-C Entered By: Worthy Keeler on 10/21/2021 14:44:25 -------------------------------------------------------------------------------- Debridement Details Patient Name: Date of Service: [name], [name] [name] 10/21/2021 2:30 PM Medical Record Number: [id number] Patient Account Number: [id number] Date of Birth/Sex: Treating RN: 07-Jul-1961 (60 y.o. [name] Primary Care Provider: Billey Gosling Other Clinician: Referring Provider: Treating Provider/Extender: Adele Schilder in Treatment: 19 Debridement Performed for Assessment: Wound #3 Left,Plantar Foot Performed By: Physician Worthy Keeler, PA Debridement Type: Debridement Severity of Tissue Pre Debridement: Fat layer exposed Level of Consciousness (Pre-procedure): Awake and Alert Pre-procedure Verification/Time Out Yes - 15:15 Taken: Start Time: 15:16 T Area Debrided (L x W): otal 3.5 (cm) x 3.5 (cm) = 12.25 (cm) Tissue and other material debrided: Viable, Non-Viable, Callus, Slough, Subcutaneous, Skin: Dermis , Skin: Epidermis, Fibrin/Exudate, Slough Level: Skin/Subcutaneous Tissue Debridement Description: Excisional Instrument: Curette Bleeding: Moderate Hemostasis Achieved: Silver Nitrate End Time: 15:21 Procedural Pain: 0 Post Procedural Pain: 0 Response to Treatment: Procedure was tolerated well Level of  Consciousness (Post- Awake and Alert procedure): Post Debridement Measurements of Total Wound Length: (cm) 3.2 Width: (cm) 3.3 Depth: (cm) 0.1 Volume: (cm) 0.829 Character of Wound/Ulcer Post Debridement: Improved Severity of Tissue Post Debridement: Fat layer exposed Post Procedure Diagnosis Same as Pre-procedure Electronic Signature(s) Signed: 10/21/2021 4:51:59 PM By: Worthy Keeler PA-C Signed: 10/21/2021 5:44:25 PM By: Deon Pilling RN, BSN Entered By: Deon Pilling on 10/21/2021 15:24:15 -------------------------------------------------------------------------------- HPI Details Patient Name: Date of Service: [name] 10/21/2021 2:30 PM Medical Record Number: [id number] Patient Account Number: [id number] Date of Birth/Sex: Treating RN: Mar 28, 1961 (61 y.o. [name] Primary Care Provider: Billey Gosling Other Clinician: Referring Provider: Treating Provider/Extender: Adele Schilder in Treatment: 19 History of Present Illness HPI Description: 10/31/2019 upon evaluation today patient appears to be doing somewhat poorly in regard to his bilateral plantar feet. He has wounds that he tells me have been present since 2012 intermittently off and on. Most recently this has been open for at least the past 6 months to a year. He has been trying to treat this in different ways using Santyl along with various other dressings including Medihoney and even at one point Xeroform. Nothing really has seem to get this completely closed. He was recently in the hospital for cellulitis of his leg subsequently he did have x-rays as well as MRIs that showed negative for any signs of osteomyelitis in regard to the wounds on his feet. Fortunately there is no signs of systemic infection at this time. No fevers, chills, nausea, vomiting, or diarrhea. Patient has previously used Darco offloading shoes as far as frontal floaters as well as postop surgical shoes. He has never been in  a total contact cast that may be something we need to strongly consider here. Patient's most recent hemoglobin A1c 1 month ago was 5.3 seems to be very well controlled which is great. Subsequently he has seen vascular as well as podiatry. His ABIs are 1.07 on the left and 1.14 on the right he seems  to be doing well he does have chronic venous stasis. 11/07/2019 upon evaluation today patient appears to be doing well with regard to his wounds all things considered. I do not see any severe worsening he still has some callus buildup on the right more than the left he notes that he has been probably more active than he should as far as walking is concerned is just very hard to not be active. He knows he needs to be more careful in this regard however. He is willing to give the cast a try at this point although he notes that he is a little nervous about this just with regard to balance although he will be very careful and obviously if he has any trouble he knows to contact the office and let me know. 1/22; patient is in for his obligatory first total contact cast change. Our intake nurse reported a very large amount of drainage which is spelled out over to the surrounding skin. Has bilateral diabetic foot wounds. He has Charcot feet. We have been using silver alginate on his wounds. 11/14/2019 on evaluation today patient is actually seeming to make good improvement in regard to his bilateral plantar foot wounds. We have been using a cast on the left side and on the right side he has been using dressings he is changing up his own accord. With that being said he tells me that he is also not walking as much just due to how unsteady he feels. He takes it easy when he does have to walk and when he does not have to walk he is resting. This is probably help in his right foot as well has the left foot which is actually measuring better. In fact both are measuring better. Overall I am very pleased with how things seem  to be progressing. The patient does have some odor on the left foot this does have me concerned about the possibility of infection, and actually probably go ahead and put him on antibiotics today as well as utilizing a continuation of the cast on the left foot I think that will be fine we probably just need to bring him in sooner to change this not last a whole week. 1/29; we brought the patient back today for a total contact cast change on the left out of concern for excessive drainage. We are using drawtex over the wound as the primary dressing 11/21/2019 on evaluation today patient appears to be doing well with regard to his left plantar foot. In fact both foot ulcers actually seem to be doing pretty well. Nonetheless he is having a lot of drainage on the left at this time and again we did obtain a wound culture did show positive for Staph aureus that was reviewed by myself today as well. Nonetheless he is on Bactrim which was shown to be sensitive that should be helping in this regard. Fortunately there is no signs of infection systemically at this point. 2/5; back in clinic today for a total contact cast change apparently secondary to very significant drainage. Still using drawtex 11/28/2019 upon evaluation today patient appears to be doing well with regard to his wounds. The right foot is doing okay as measured about the same in my opinion. The left foot is actually showing signs of significant improvement is measuring smaller there is a lot of hyper granulation likely due to the continued drainage at this point. We did obtain approval for a snap VAC I think that is good to  be appropriate for him and will likely help this tremendously underneath the cast. He is definitely in agreement with proceeding with such. 2/12; patient came in today after his snap VAC lost suction. Brought in to see one of our nurses. The dressing was replaced and then we put the cast back on and rehooked up the snap VAC.  Apparently his wound looked very good per our intake nurse. 2/15; again we replaced the cast on Friday. By Saturday the snap VAC and light suction. He called this morning he comes in acutely. The wounds look fine however the VAC is not functioning. We replaced the cast using silver alginate as the primary dressing backed with Kerramax. The snap VAC was not replaced 12/05/2019 upon evaluation today patient appears to be doing better in regard to his left plantar foot ulcer. Fortunately there is no signs of active infection at this time. Unfortunately he is continuing to have issues with the right foot he is really not making any progress here things seem to be somewhat stagnant to be honest. The depth has increased but that is due to me having debrided the wound in the past based on what I am seeing. 12/12/2019 on evaluation today patient appears to be doing more poorly in regard to the left lower extremity. He has some erythema spreading up the side of his foot I am concerned about infection again at this point. Unfortunately he has been seeing improvement with a total contact cast but I do not think we should put that on today. On his right plantar foot he continues to have significant drainage this is actually measuring deeper I really do not feel like you are making any progress whatsoever. I have prescribed Granix for him unfortunately his insurance apparently was going to cost him a $500 co-pay. 12/19/2019 upon evaluation today patient actually appears to be doing better in regard to both wounds. With that being said he actually did get the reGranix which he had to pay $500 for. With that being said it does look like that he is actually made some improvement based on what I am seeing at this point with the reGranix. Obviously if he is going to continue this we are going to do something about trying to get him some help in covering the cost. 12/26/2019 on evaluation today patient appears to be doing  really much better even compared to last week. Overall the wound seems to be much better even compared to last week and last week was better than the week before. Since has been using the reGranix his symptoms have improved significantly. With that being said the issue right now is simply that this is a very expensive medication for him the first dose cost him $500. Upon inspection patient's wound bed actually is however dramatically improved compared to before he started this 2 weeks ago. 01/02/2020 upon evaluation today patient appears to actually be doing well. He still had a little bit of reGranix left that has been using in small amounts he just been applying it every other day instead of every day in order to make it go longer. Overall we are still seeing excellent improvement he is measuring smaller looking better healthier tissue and everything seems to be pointing to this headed in the right direction. Fortunately there is no evidence of infection either which is also excellent news. He does have his MRI coming up within the next week. 01/09/2020 upon evaluation today patient appears to be doing a little worse  today compared to previous week's evaluation. He is actually been out of the reGranix at this point. He has been trying to make the stretch out so he has been changing the dressings on a regular set schedule like he was previous. I think this has made a difference. Fortunately there is no signs of active infection at this time. No fevers, chills, nausea, vomiting, or diarrhea. 01/16/2020 upon evaluation today patient appears to be doing well with regard to his left plantar foot ulcer. The right plantar foot still shows some significant depth at this point. Fortunately there is no signs of active infection at this time. 01/30/2020 upon evaluation today patient appears to be doing about the same in regard to his right plantar foot ulcer there is still some depth here and we had to wait till he  actually switched over to his new insurance to get approval for the MRI under his new insurance plan. With that being said he now has switched as of April 1. Fortunately there is no signs of active infection at this time. Overall in regard to his left foot ulcer this seems to be doing much better and I am actually very pleased with how things are going. With that being said it is not quite as much progress as we were seeing with the reGranix but at the same time he has had trouble getting this apparently there is been some hindrance here. I Georgina Peer try to actually send this to [facility] that was recommended by the drug rep to me. 02/13/20 upon evaluation today patient appears to be doing better in regard to his left foot ulcer this is great news. Unfortunately the right foot ulcer is not really significantly better at this time. There is no signs of active infection systemically though he did have his MRI which showed unfortunately he does have infection noted including an abscess in the foot. There is also marrow changes noted which are consistent with osteomyelitis based on the radiology review and interpretation. Unfortunately considering that the wound is not really making the progress that we will he would like to be seen I think that this is an indication that he may need some further referral both infectious disease as well as potentially to podiatrist to see if there is anything that can be done to help with the situation that were dealing with here. The left foot again is doing great. READMISSION 06/04/2021 This is a 61 year old man who was in the clinic in 2021 followed by Jeri Cos for areas on the right and left foot. He developed a left foot infection and was referred to ID. He left the clinic in a nonhealed state and was followed for a period of [facility] Cannon Kettle. Apparently things really deteriorated in early July when he was admitted to hospital from 04/27/2021  through 05/06/2021 with sepsis secondary to a left foot infection. His blood cultures were negative. An MRI suggested fifth metatarsal osteomyelitis a left ankle septic joint. He was treated with vancomycin and ertapenem which he is still taking and may just about be finishing. He was seen by orthopedics and the patient adamantly refused to BKA. As far as I can tell he did not have the ankle aspirated I am not exactly sure what the issue was here. Since he has been discharged she is at [facility] for rehabilitation. He was last seen by Dr. Drucilla Schmidt on 05/20/2021 he noted osteo of the tibial talar bone cuboid and  fifth metatarsal which is even more extensive than what was suggested by the MRI. He is apparently going for a consultation with orthopedic surgery in [place] sometime next week. I received a call about this man 2 weeks ago from Dr. Gwenlyn Found who follows him for the possibility of PAD. ABIs I think done in the office showed a ABI on the right of 1.05 at the PTA and 0.99 at the PTA on the left. He had a DVT rule out in the left leg that was negative for the DVT. Past medical history is extensive and includes diastolic heart failure, right first metatarsal head ulcer in 2021, excision of the right second ray by Dr. Cannon Kettle on 03/13/2020, hypertension, hypothyroidism. Left total hip replacement, right total knee replacement, carpal tunnel syndrome, obstructive sleep apnea alcohol abuse with cirrhosis although the patient denies current alcohol intake. The patient does not think he is a diabetic however looking through [place] link I see 2 HgbAic's of this year that were greater than equal to 6.5 which by definition makes him diabetic. Nevertheless he is not on any treatment and does not check his blood sugars. The patient is now back home out of the nursing home. Saw Dr. Drucilla Schmidt last week he was taken off vancomycin and ertapenem on August 22 and now is on doxycycline on Augmentin. He also saw  Dr. Buford Dresser who is his orthopedic surgeon in [place] he recommended a [employer]. He has been using Medihoney. 9/6I have been having trouble getting hyperbarics approved through our prior authorization process. Even though he had a limb threatening infection in the left foot and probably the left ankle there glitches in how some of the reports are worded also some of the consultants. In any case I am going to repeat his sedimentation rate and C-reactive protein. I am generally not in favor of doing things like this as they really do not alter the plan of care from my point of view however I am going to need to demonstrate that these remain high in order to get this through forhyperbaric treatment for chronic refractory osteomyelitis 9/13; following this man for a wound on his left plantar foot in the setting of type 2 diabetes and Charcot deformity. He has underlying chronic refractory osteomyelitis. Follow-up sedimentation rate and C-reactive protein were both elevated but the C-reactive protein was down to 1.4, sedimentation rate at 70. Sedimentation rate was only slightly down from previous at 85. His wound is measuring slightly smaller. 9/20; patient started hyperbaric oxygen therapy today and tolerated treatment well. This is for the underlying osteomyelitis. He remains on antibiotics but thinks he is getting close to finishing. The wound on the plantar aspect of his foot is the other issue we are following here. He is using Medihoney The patient has a Charcot foot in the setting of type 2 diabetes. He is going to need a total contact cast although his partner was away this week and we elected to delay this till next week 9/27; patient still tolerating hyperbaric oxygen well. Wound looked generally healthy not much depth under illumination still 100% covered in fibrinous debris. Raised callused edges around the wound he was prepared for a total contact cast. We have been using Medihoney 9/30;  patient is back for his first obligatory total contact cast change. We are using Hydrofera Blue. Noted by our intake nurse to have a lot of drainage or at least a moderate amount of drainage. I am not sure I was previously aware  of this 10/4; patient arrives today with a lot of drainage under the cast. When he had it changed last Friday there was also a similar amount of drainage. Her intake nurse says that they tried a wound VAC on him perhaps while I was on vacation in August under the cast but that did not work. In my experience that has not been unusual. We have been using Hydrofera Blue with all the secondary absorbers. The drainage today went right through all of our dressings. The patient is concerned about his foot being in a cast without much drainage. He is tolerating HBO well. There has been improvements in the wound in the mid part of his foot in the setting of a Charcot deformity 10/7; patient presents for cast change. He has no issues or complaints today. He denies signs of infection. 10/11; patient presents for cast change. At this time he would like to take a break from the cast. He would like to do daily dressing changes with Hydrofera Blue. He currently denies signs of infection. 10/14; his cast was taken off last week at his request. He arrives in the clinic with [facility]. He has been changing the dressing himself. He has way too much edema in the left foot and leg to consider a total contact cast. I do not really know the issue here. He does have chronic venous insufficiency His wife stopped me in the clinic earlier in the week to report he is drinking again and she is concerned. I am uncertain whether there are other issues 10/18; he arrived in clinic last week having bilateral lower extremity edema likely secondary to chronic venous insufficiency there was too much edema in the left leg to apply a total contact cast. I put him in compression on the left leg to control the  swelling. This week he cut the wrap on the foot for reasons that are unclear however today he arrives in clinic with a smaller left leg but massive edema in the left foot. He is supposed to be wearing a juxta lite stocking on the right leg but he is not wearing the contact layer. His attendance at hyperbaric oxygen has been dwindling, he did not dive yesterday and he did not dive today concerned about hyperbarics causing swelling. I looked back in his record his last echo was in 2020 essentially normal left and right ventricular function. Last BUN and creatinine were done in July this was normal. He is having a lot of drainage in the left plantar foot wound 10/25; again he comes in today having missed HBO yesterday. Macerated skin around the wound which really does not look very good at all. A lot of edema in the left foot but an improvement in edema on the left leg. We wrapped him last week because of the amount of swelling in the left leg. We could not apply a total contact cast. The patient states that he wants to be able to change his dressing himself. I might consider this if he had stockings to control the swelling. He said he be here for HBO tomorrow. I will check the degree of erythema in his forefoot which I have marked. He is on doxycycline and Augmentin which was renewed by Dr. Drucilla Schmidt but I cannot see a follow-up note, follow-up inflammatory markers etc. I 1 point he said he was going to see his orthopedic surgeon in [place] I am not sure if he ever did this. I do not know why he  has not followed up with infectious disease, he says he was not given an appointment but he is still on Augmentin doxycycline 11/1; he did not do the lab work I ordered last week. Still on Augmentin and doxycycline. Silver alginate and he is changing this daily using his own juxta lite stocking. The surface of the wound does not look too bad. No real epithelialization however. The patient was seen today along with  HBO 08/26/2021 upon evaluation today patient is being seen at his request by myself he wanted to transfer care to see me. With that being said he has been seeing Dr. Dellia Nims since he came back in August of this year. Unfortunately he tells me he still having quite a bit of drainage. He is also significant erythema and warmth noted of the foot as well. He has been hit or miss with regard to hyperbaric oxygen therapy tells me that at this point he took this week off because he was extremely claustrophobic and having a lot of issues he plans to start back next week. Nonetheless he has been given some medication by Dr. Dellia Nims as well to help calm things down which again may help him. Hopefully he can get back in hyperbarics as I think this is likely necessary. Nonetheless there does seem to be evidence of cellulitis noted today as well and in general I am a little concerned in that regard. His wound unfortunately is significant on this left foot and is right where he takes the brunt of the force secondary to the Charcot arthropathy. I do not think a total contact cast is ideal due to the fact that he unfortunately is draining much too significantly he is also not happy with the idea of using a cast therefore he states he would not want to do that anyway. 09/16/2021 upon evaluation today patient's plantar foot ulcer unfortunately continues to show signs of issues here. He has been in the hospital due to trying to detox himself from alcohol he had withdrawal symptoms and subsequently was hospitalized. During that time it was recommended by both orthopedics as well as infectious disease apparently that he proceed with amputation. With that being said they discussed with him the risk of not doing so. This is well- documented in the encounter. With that being said the patient does not want to proceed down that road and tells me that he declined their advice in that regard. Subsequently our goal then is to try to  do what we can to try to get this thing healed and closed. I think this means he is getting need to stay off of it is much as possible he does have a wheelchair at home that he tells me he can use I think that that is going to be something that he does need to do. 09/23/2021 upon evaluation today patient continues to have significant issues here with his foot ulcer which is plantar on the left foot. Subsequently again he does have a history of Charcot foot he also has an issue of having had osteomyelitis as well as an open wound for some time here. Its been recommended multiple times for him to have an amputation although that is not something that he is really interested or wanting to do. 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 Subsequently this means that he also has a gait abnormality that is causing issues with his walking and causing abnormal pressures in the central part  of his foot this is the reason he has a wound. Nonetheless I want to see what I can do about trying to get a boot to help with stabilization and offloading of working to see what we can do in that regard. 09/30/2021 upon evaluation today patient appears to be doing well with regard to his plantar foot ulcer. He still has significant granular hypergranulation but again this is much less than what it was even last time I saw him last week. Fortunately I think we are headed in the right direction. This is definitely something we could consider a skin graft or something along those lines if we can get it flattened out enough and get the drainage from being so significant. I feel like we are making some headway here. 10/07/2021 upon evaluation today patient appears to be doing decently well in regard to his wound. Fortunately there does not appear to be any signs of infection overall I feel like it is actually showing some signs of improvement. He still has some hypergranulation but  this is much less than its been. 10/21/2021 upon evaluation today patient appears to be doing well with regard to his wound is actually showing some signs of improvement which is great. We have been doing the chemical cauterization with silver nitrate as well as debridement as needed and again has been using silver alginate up to this point. With that being said he does have some Hydrofera Blue at home he wonders if that can be beneficial at this point. Electronic Signature(s) Signed: 10/21/2021 3:31:38 PM By: Lenda KelpStone III, Nashayla Telleria PA-C Entered By: Lenda KelpStone III, Jerry Haugen on 10/21/2021 15:31:37 -------------------------------------------------------------------------------- Physical Exam Details Patient Name: Date of Service: [name], [name] 10/21/2021 2:30 PM Medical Record Number: [id number] Patient Account Number: [id number] Date of Birth/Sex: Treating RN: 05/10/1961 (60 y.o. [name] SchoonerM) Boehlein, Linda Primary Care Provider: Cheryll CockayneBurns, Stacy Other Clinician: Referring Provider: Treating Provider/Extender: Atlee AbideStone III, Catheleen Langhorne Burns, Stacy Weeks in Treatment: 3319 Constitutional Well-nourished and well-hydrated in no acute distress. Respiratory normal breathing without difficulty. Psychiatric this patient is able to make decisions and demonstrates good insight into disease process. Alert and Oriented x 3. pleasant and cooperative. Notes Upon inspection patient's wound did require sharp debridement to clear away some of the necrotic debris on the surface of the wound including slough and biofilm as well as clearing away some of the callus around the edges of the wound that was flaking off. Post debridement the wound appears to be doing significantly better. Electronic Signature(s) Signed: 10/21/2021 3:32:36 PM By: Lenda KelpStone III, Kerrianne Jeng PA-C Entered By: Lenda KelpStone III, Reilly Molchan on 10/21/2021 15:32:36 -------------------------------------------------------------------------------- Physician Orders Details Patient Name: Date of  Service: [name], [name] 10/21/2021 2:30 PM Medical Record Number: [id number] Patient Account Number: [id number] Date of Birth/Sex: Treating RN: 06/24/1961 (60 y.o. [name]) Deaton, Bobbi Primary Care Provider: Cheryll CockayneBurns, Stacy Other Clinician: Referring Provider: Treating Provider/Extender: Rickard PatienceStone III, Mahmud Keithly Burns, Stacy Weeks in Treatment: 3419 Verbal / Phone Orders: No Diagnosis Coding ICD-10 Coding Code Description E11.621 Type 2 diabetes mellitus with foot ulcer M86.372 Chronic multifocal osteomyelitis, left ankle and foot L97.528 Non-pressure chronic ulcer of other part of left foot with other specified severity M19.072 Primary osteoarthritis, left ankle and foot M14.672 Charcot's joint, left ankle and foot Follow-up Appointments ppointment in 1 week. [facility]- [facility] Wednesday Return A Bathing/ Shower/ Hygiene May shower and wash wound with soap and water. - prior to dressing change Edema Control - Lymphedema / SCD / Other Elevate legs to the level of the heart or  above for 30 minutes daily and/or when sitting, a frequency of: - throughout the day Avoid standing for long periods of time. Exercise regularly Moisturize legs daily. Compression stocking or Garment 30-40 mm/Hg pressure to: - wear the Juxtalite HD to right and left leg. apply in the morning and remove at night. Off-Loading Open toe surgical shoe to: - left foot Other: - minimal weight bearing left foot use wheelchair for mobility. Additional Orders / Instructions Follow [place] wound care orders this week; continue Home Health for wound care. May utilize formulary equivalent dressing for wound treatment orders unless otherwise specified. - Advance home health Wound Treatment Wound #3 - Foot Wound Laterality: Plantar, Left Cleanser: Soap and Water [facility]) Every Other Day/15 Days Discharge Instructions: May shower and wash wound with dial antibacterial soap and water prior to dressing change. Cleanser:  Wound Cleanser (Home Health) (Generic) Every Other Day/15 Days Discharge Instructions: Cleanse the wound with wound cleanser prior to applying a clean dressing using gauze sponges, not tissue or cotton balls. Peri-Wound Care: Zinc Oxide Ointment 30g tube [name]) Every Other Day/15 Days Discharge Instructions: Apply Zinc Oxide as needed for any maceration or redness to periwound with each dressing change Peri-Wound Care: Sween Lotion (Moisturizing lotion) (Home Health) Every Other Day/15 Days Discharge Instructions: Apply moisturizing lotion as directed Prim Dressing: Hydrofera Blue Ready Foam, 2.5 x2.5 in Every Other Day/15 Days ary Discharge Instructions: Apply to wound bed as instructed Secondary Dressing: Zetuvit Plus 4x8 in [facility]) (Generic) Every Other Day/15 Days Discharge Instructions: Apply over primary dressing as directed. Secured With: [employer], 4.5x3.1 (in/yd) (Home Health) (Generic) Every Other Day/15 Days Discharge Instructions: Secure with Kerlix as directed. Secured With: 27M Medipore [name] Soft Cloth Surgical Tape, 2x2 (in/yd) (Home Health) (Generic) Every Other Day/15 Days Discharge Instructions: Secure dressing with tape as directed. Electronic Signature(s) Signed: 10/21/2021 4:51:59 PM By: Worthy Keeler PA-C Signed: 10/21/2021 5:44:25 PM By: Deon Pilling RN, BSN Entered By: Deon Pilling on 10/21/2021 15:27:33 -------------------------------------------------------------------------------- Problem List Details Patient Name: Date of Service: JAESHON, WIGTON [name] 10/21/2021 2:30 PM Medical Record Number: [id number] Patient Account Number: [id number] Date of Birth/Sex: Treating RN: 05/01/1961 (61 y.o. [name] Primary Care Provider: Billey Gosling Other Clinician: Referring Provider: Treating Provider/Extender: Adele Schilder in Treatment: 19 Active Problems ICD-10 Encounter Code Description Active Date MDM Diagnosis E11.621  Type 2 diabetes mellitus with foot ulcer 06/04/2021 No Yes M86.372 Chronic multifocal osteomyelitis, left ankle and foot 06/04/2021 No Yes L97.528 Non-pressure chronic ulcer of other part of left foot with other specified 06/04/2021 No Yes severity M19.072 Primary osteoarthritis, left ankle and foot 09/23/2021 No Yes M14.672 Charcot's joint, left ankle and foot 06/04/2021 No Yes Inactive Problems Resolved Problems Electronic Signature(s) Signed: 10/21/2021 2:44:17 PM By: Worthy Keeler PA-C Entered By: Worthy Keeler on 10/21/2021 14:44:17 -------------------------------------------------------------------------------- Progress Note Details Patient Name: Date of Service: [name], [name] [name] 10/21/2021 2:30 PM Medical Record Number: [id number] Patient Account Number: [id number] Date of Birth/Sex: Treating RN: July 01, 1961 (61 y.o. [name] Primary Care Provider: Billey Gosling Other Clinician: Referring Provider: Treating Provider/Extender: Adele Schilder in Treatment: 19 Subjective Chief Complaint Information obtained from Patient Bilateral foot diabetic ulcers History of Present Illness (HPI) 10/31/2019 upon evaluation today patient appears to be doing somewhat poorly in regard to his bilateral plantar feet. He has wounds that he tells me have been present since 2012 intermittently off and on. Most recently  this has been open for at least the past 6 months to a year. He has been trying to treat this in different ways using Santyl along with various other dressings including Medihoney and even at one point Xeroform. Nothing really has seem to get this completely closed. He was recently in the hospital for cellulitis of his leg subsequently he did have x-rays as well as MRIs that showed negative for any signs of osteomyelitis in regard to the wounds on his feet. Fortunately there is no signs of systemic infection at this time. No fevers, chills, nausea, vomiting,  or diarrhea. Patient has previously used Darco offloading shoes as far as frontal floaters as well as postop surgical shoes. He has never been in a total contact cast that may be something we need to strongly consider here. Patient's most recent hemoglobin A1c 1 month ago was 5.3 seems to be very well controlled which is great. Subsequently he has seen vascular as well as podiatry. His ABIs are 1.07 on the left and 1.14 on the right he seems to be doing well he does have chronic venous stasis. 11/07/2019 upon evaluation today patient appears to be doing well with regard to his wounds all things considered. I do not see any severe worsening he still has some callus buildup on the right more than the left he notes that he has been probably more active than he should as far as walking is concerned is just very hard to not be active. He knows he needs to be more careful in this regard however. He is willing to give the cast a try at this point although he notes that he is a little nervous about this just with regard to balance although he will be very careful and obviously if he has any trouble he knows to contact the office and let me know. 1/22; patient is in for his obligatory first total contact cast change. Our intake nurse reported a very large amount of drainage which is spelled out over to the surrounding skin. Has bilateral diabetic foot wounds. He has Charcot feet. We have been using silver alginate on his wounds. 11/14/2019 on evaluation today patient is actually seeming to make good improvement in regard to his bilateral plantar foot wounds. We have been using a cast on the left side and on the right side he has been using dressings he is changing up his own accord. With that being said he tells me that he is also not walking as much just due to how unsteady he feels. He takes it easy when he does have to walk and when he does not have to walk he is resting. This is probably help in his right  foot as well has the left foot which is actually measuring better. In fact both are measuring better. Overall I am very pleased with how things seem to be progressing. The patient does have some odor on the left foot this does have me concerned about the possibility of infection, and actually probably go ahead and put him on antibiotics today as well as utilizing a continuation of the cast on the left foot I think that will be fine we probably just need to bring him in sooner to change this not last a whole week. 1/29; we brought the patient back today for a total contact cast change on the left out of concern for excessive drainage. We are using drawtex over the wound as the primary dressing 11/21/2019 on evaluation today  patient appears to be doing well with regard to his left plantar foot. In fact both foot ulcers actually seem to be doing pretty well. Nonetheless he is having a lot of drainage on the left at this time and again we did obtain a wound culture did show positive for Staph aureus that was reviewed by myself today as well. Nonetheless he is on Bactrim which was shown to be sensitive that should be helping in this regard. Fortunately there is no signs of infection systemically at this point. 2/5; back in clinic today for a total contact cast change apparently secondary to very significant drainage. Still using drawtex 11/28/2019 upon evaluation today patient appears to be doing well with regard to his wounds. The right foot is doing okay as measured about the same in my opinion. The left foot is actually showing signs of significant improvement is measuring smaller there is a lot of hyper granulation likely due to the continued drainage at this point. We did obtain approval for a snap VAC I think that is good to be appropriate for him and will likely help this tremendously underneath the cast. He is definitely in agreement with proceeding with such. 2/12; patient came in today after his  snap VAC lost suction. Brought in to see one of our nurses. The dressing was replaced and then we put the cast back on and rehooked up the snap VAC. Apparently his wound looked very good per our intake nurse. 2/15; again we replaced the cast on Friday. By Saturday the snap VAC and light suction. He called this morning he comes in acutely. The wounds look fine however the VAC is not functioning. We replaced the cast using silver alginate as the primary dressing backed with Kerramax. The snap VAC was not replaced 12/05/2019 upon evaluation today patient appears to be doing better in regard to his left plantar foot ulcer. Fortunately there is no signs of active infection at this time. Unfortunately he is continuing to have issues with the right foot he is really not making any progress here things seem to be somewhat stagnant to be honest. The depth has increased but that is due to me having debrided the wound in the past based on what I am seeing. 12/12/2019 on evaluation today patient appears to be doing more poorly in regard to the left lower extremity. He has some erythema spreading up the side of his foot I am concerned about infection again at this point. Unfortunately he has been seeing improvement with a total contact cast but I do not think we should put that on today. On his right plantar foot he continues to have significant drainage this is actually measuring deeper I really do not feel like you are making any progress whatsoever. I have prescribed Granix for him unfortunately his insurance apparently was going to cost him a $500 co-pay. 12/19/2019 upon evaluation today patient actually appears to be doing better in regard to both wounds. With that being said he actually did get the reGranix which he had to pay $500 for. With that being said it does look like that he is actually made some improvement based on what I am seeing at this point with the reGranix. Obviously if he is going to continue  this we are going to do something about trying to get him some help in covering the cost. 12/26/2019 on evaluation today patient appears to be doing really much better even compared to last week. Overall the wound seems to  be much better even compared to last week and last week was better than the week before. Since has been using the reGranix his symptoms have improved significantly. With that being said the issue right now is simply that this is a very expensive medication for him the first dose cost him $500. Upon inspection patient's wound bed actually is however dramatically improved compared to before he started this 2 weeks ago. 01/02/2020 upon evaluation today patient appears to actually be doing well. He still had a little bit of reGranix left that has been using in small amounts he just been applying it every other day instead of every day in order to make it go longer. Overall we are still seeing excellent improvement he is measuring smaller looking better healthier tissue and everything seems to be pointing to this headed in the right direction. Fortunately there is no evidence of infection either which is also excellent news. He does have his MRI coming up within the next week. 01/09/2020 upon evaluation today patient appears to be doing a little worse today compared to previous week's evaluation. He is actually been out of the reGranix at this point. He has been trying to make the stretch out so he has been changing the dressings on a regular set schedule like he was previous. I think this has made a difference. Fortunately there is no signs of active infection at this time. No fevers, chills, nausea, vomiting, or diarrhea. 01/16/2020 upon evaluation today patient appears to be doing well with regard to his left plantar foot ulcer. The right plantar foot still shows some significant depth at this point. Fortunately there is no signs of active infection at this time. 01/30/2020 upon evaluation  today patient appears to be doing about the same in regard to his right plantar foot ulcer there is still some depth here and we had to wait till he actually switched over to his new insurance to get approval for the MRI under his new insurance plan. With that being said he now has switched as of April 1. Fortunately there is no signs of active infection at this time. Overall in regard to his left foot ulcer this seems to be doing much better and I am actually very pleased with how things are going. With that being said it is not quite as much progress as we were seeing with the reGranix but at the same time he has had trouble getting this apparently there is been some hindrance here. I Georgina Peer try to actually send this to [facility] that was recommended by the drug rep to me. 02/13/20 upon evaluation today patient appears to be doing better in regard to his left foot ulcer this is great news. Unfortunately the right foot ulcer is not really significantly better at this time. There is no signs of active infection systemically though he did have his MRI which showed unfortunately he does have infection noted including an abscess in the foot. There is also marrow changes noted which are consistent with osteomyelitis based on the radiology review and interpretation. Unfortunately considering that the wound is not really making the progress that we will he would like to be seen I think that this is an indication that he may need some further referral both infectious disease as well as potentially to podiatrist to see if there is anything that can be done to help with the situation that were dealing with here. The left foot again is doing great. READMISSION 06/04/2021 This is  a 61 year old man who was in the clinic in 2021 followed by Jeri Cos for areas on the right and left foot. He developed a left foot infection and was referred to ID. He left the clinic in a nonhealed state and was followed for a  period of [facility] Cannon Kettle. Apparently things really deteriorated in early July when he was admitted to hospital from 04/27/2021 through 05/06/2021 with sepsis secondary to a left foot infection. His blood cultures were negative. An MRI suggested fifth metatarsal osteomyelitis a left ankle septic joint. He was treated with vancomycin and ertapenem which he is still taking and may just about be finishing. He was seen by orthopedics and the patient adamantly refused to BKA. As far as I can tell he did not have the ankle aspirated I am not exactly sure what the issue was here. Since he has been discharged she is at [facility] for rehabilitation. He was last seen by Dr. Drucilla Schmidt on 05/20/2021 he noted osteo of the tibial talar bone cuboid and fifth metatarsal which is even more extensive than what was suggested by the MRI. He is apparently going for a consultation with orthopedic surgery in [place] sometime next week. I received a call about this man 2 weeks ago from Dr. Gwenlyn Found who follows him for the possibility of PAD. ABIs I think done in the office showed a ABI on the right of 1.05 at the PTA and 0.99 at the PTA on the left. He had a DVT rule out in the left leg that was negative for the DVT. Past medical history is extensive and includes diastolic heart failure, right first metatarsal head ulcer in 2021, excision of the right second ray by Dr. Cannon Kettle on 03/13/2020, hypertension, hypothyroidism. Left total hip replacement, right total knee replacement, carpal tunnel syndrome, obstructive sleep apnea alcohol abuse with cirrhosis although the patient denies current alcohol intake. The patient does not think he is a diabetic however looking through [facility] I see 2 HgbAic's of this year that were greater than equal to 6.5 which by definition makes him diabetic. Nevertheless he is not on any treatment and does not check his blood sugars. The patient is now back home out  of the nursing home. Saw Dr. Drucilla Schmidt last week he was taken off vancomycin and ertapenem on August 22 and now is on doxycycline on Augmentin. He also saw Dr. Buford Dresser who is his orthopedic surgeon in [place] he recommended a [employer]. He has been using Medihoney. 9/6I have been having trouble getting hyperbarics approved through our prior authorization process. Even though he had a limb threatening infection in the left foot and probably the left ankle there glitches in how some of the reports are worded also some of the consultants. In any case I am going to repeat his sedimentation rate and C-reactive protein. I am generally not in favor of doing things like this as they really do not alter the plan of care from my point of view however I am going to need to demonstrate that these remain high in order to get this through forhyperbaric treatment for chronic refractory osteomyelitis 9/13; following this man for a wound on his left plantar foot in the setting of type 2 diabetes and Charcot deformity. He has underlying chronic refractory osteomyelitis. Follow-up sedimentation rate and C-reactive protein were both elevated but the C-reactive protein was down to 1.4, sedimentation rate at 70. Sedimentation rate was only slightly down from previous  at 85. His wound is measuring slightly smaller. 9/20; patient started hyperbaric oxygen therapy today and tolerated treatment well. This is for the underlying osteomyelitis. He remains on antibiotics but thinks he is getting close to finishing. The wound on the plantar aspect of his foot is the other issue we are following here. He is using Medihoney The patient has a Charcot foot in the setting of type 2 diabetes. He is going to need a total contact cast although his partner was away this week and we elected to delay this till next week 9/27; patient still tolerating hyperbaric oxygen well. Wound looked generally healthy not much depth under illumination still  100% covered in fibrinous debris. Raised callused edges around the wound he was prepared for a total contact cast. We have been using Medihoney 9/30; patient is back for his first obligatory total contact cast change. We are using Hydrofera Blue. Noted by our intake nurse to have a lot of drainage or at least a moderate amount of drainage. I am not sure I was previously aware of this 10/4; patient arrives today with a lot of drainage under the cast. When he had it changed last Friday there was also a similar amount of drainage. Her intake nurse says that they tried a wound VAC on him perhaps while I was on vacation in August under the cast but that did not work. In my experience that has not been unusual. We have been using Hydrofera Blue with all the secondary absorbers. The drainage today went right through all of our dressings. The patient is concerned about his foot being in a cast without much drainage. He is tolerating HBO well. There has been improvements in the wound in the mid part of his foot in the setting of a Charcot deformity 10/7; patient presents for cast change. He has no issues or complaints today. He denies signs of infection. 10/11; patient presents for cast change. At this time he would like to take a break from the cast. He would like to do daily dressing changes with Hydrofera Blue. He currently denies signs of infection. 10/14; his cast was taken off last week at his request. He arrives in the clinic with [facility]. He has been changing the dressing himself. He has way too much edema in the left foot and leg to consider a total contact cast. I do not really know the issue here. He does have chronic venous insufficiency His wife stopped me in the clinic earlier in the week to report he is drinking again and she is concerned. I am uncertain whether there are other issues 10/18; he arrived in clinic last week having bilateral lower extremity edema likely secondary to chronic  venous insufficiency there was too much edema in the left leg to apply a total contact cast. I put him in compression on the left leg to control the swelling. This week he cut the wrap on the foot for reasons that are unclear however today he arrives in clinic with a smaller left leg but massive edema in the left foot. He is supposed to be wearing a juxta lite stocking on the right leg but he is not wearing the contact layer. His attendance at hyperbaric oxygen has been dwindling, he did not dive yesterday and he did not dive today concerned about hyperbarics causing swelling. I looked back in his record his last echo was in 2020 essentially normal left and right ventricular function. Last BUN and creatinine were done in  July this was normal. He is having a lot of drainage in the left plantar foot wound 10/25; again he comes in today having missed HBO yesterday. Macerated skin around the wound which really does not look very good at all. A lot of edema in the left foot but an improvement in edema on the left leg. We wrapped him last week because of the amount of swelling in the left leg. We could not apply a total contact cast. The patient states that he wants to be able to change his dressing himself. I might consider this if he had stockings to control the swelling. He said he be here for HBO tomorrow. I will check the degree of erythema in his forefoot which I have marked. He is on doxycycline and Augmentin which was renewed by Dr. Drucilla Schmidt but I cannot see a follow-up note, follow-up inflammatory markers etc. I 1 point he said he was going to see his orthopedic surgeon in [place] I am not sure if he ever did this. I do not know why he has not followed up with infectious disease, he says he was not given an appointment but he is still on Augmentin doxycycline 11/1; he did not do the lab work I ordered last week. Still on Augmentin and doxycycline. Silver alginate and he is changing this daily using  his own juxta lite stocking. The surface of the wound does not look too bad. No real epithelialization however. The patient was seen today along with HBO 08/26/2021 upon evaluation today patient is being seen at his request by myself he wanted to transfer care to see me. With that being said he has been seeing Dr. Dellia Nims since he came back in August of this year. Unfortunately he tells me he still having quite a bit of drainage. He is also significant erythema and warmth noted of the foot as well. He has been hit or miss with regard to hyperbaric oxygen therapy tells me that at this point he took this week off because he was extremely claustrophobic and having a lot of issues he plans to start back next week. Nonetheless he has been given some medication by Dr. Dellia Nims as well to help calm things down which again may help him. Hopefully he can get back in hyperbarics as I think this is likely necessary. Nonetheless there does seem to be evidence of cellulitis noted today as well and in general I am a little concerned in that regard. His wound unfortunately is significant on this left foot and is right where he takes the brunt of the force secondary to the Charcot arthropathy. I do not think a total contact cast is ideal due to the fact that he unfortunately is draining much too significantly he is also not happy with the idea of using a cast therefore he states he would not want to do that anyway. 09/16/2021 upon evaluation today patient's plantar foot ulcer unfortunately continues to show signs of issues here. He has been in the hospital due to trying to detox himself from alcohol he had withdrawal symptoms and subsequently was hospitalized. During that time it was recommended by both orthopedics as well as infectious disease apparently that he proceed with amputation. With that being said they discussed with him the risk of not doing so. This is well- documented in the encounter. With that being said  the patient does not want to proceed down that road and tells me that he declined their advice in that regard. Subsequently  our goal then is to try to do what we can to try to get this thing healed and closed. I think this means he is getting need to stay off of it is much as possible he does have a wheelchair at home that he tells me he can use I think that that is going to be something that he does need to do. 09/23/2021 upon evaluation today patient continues to have significant issues here with his foot ulcer which is plantar on the left foot. Subsequently again he does have a history of Charcot foot he also has an issue of having had osteomyelitis as well as an open wound for some time here. Its been recommended multiple times for him to have an amputation although that is not something that he is really interested or wanting to do. 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 Subsequently this means that he also has a gait abnormality that is causing issues with his walking and causing abnormal pressures in the central part of his foot this is the reason he has a wound. Nonetheless I want to see what I can do about trying to get a boot to help with stabilization and offloading of working to see what we can do in that regard. 09/30/2021 upon evaluation today patient appears to be doing well with regard to his plantar foot ulcer. He still has significant granular hypergranulation but again this is much less than what it was even last time I saw him last week. Fortunately I think we are headed in the right direction. This is definitely something we could consider a skin graft or something along those lines if we can get it flattened out enough and get the drainage from being so significant. I feel like we are making some headway here. 10/07/2021 upon evaluation today patient appears to be doing decently well in regard to his wound. Fortunately there does  not appear to be any signs of infection overall I feel like it is actually showing some signs of improvement. He still has some hypergranulation but this is much less than its been. 10/21/2021 upon evaluation today patient appears to be doing well with regard to his wound is actually showing some signs of improvement which is great. We have been doing the chemical cauterization with silver nitrate as well as debridement as needed and again has been using silver alginate up to this point. With that being said he does have some Hydrofera Blue at home he wonders if that can be beneficial at this point. Objective Constitutional Well-nourished and well-hydrated in no acute distress. Vitals Time Taken: 2:55 PM, Weight: 270 lbs, Temperature: 98 F, Pulse: 86 bpm, Respiratory Rate: 20 breaths/min, Blood Pressure: 126/85 mmHg. Respiratory normal breathing without difficulty. Psychiatric this patient is able to make decisions and demonstrates good insight into disease process. Alert and Oriented x 3. pleasant and cooperative. General Notes: Upon inspection patient's wound did require sharp debridement to clear away some of the necrotic debris on the surface of the wound including slough and biofilm as well as clearing away some of the callus around the edges of the wound that was flaking off. Post debridement the wound appears to be doing significantly better. Integumentary (Hair, Skin) Wound #3 status is Open. Original cause of wound was Gradually Appeared. The date acquired was: 03/18/2021. The wound has been in treatment 19 weeks. The wound is located on the [place]. The wound measures 3.2cm length  x 3.3cm width x 0.1cm depth; 8.294cm^2 area and 0.829cm^3 volume. There is Fat Layer (Subcutaneous Tissue) exposed. There is no tunneling or undermining noted. There is a large amount of serosanguineous drainage noted. The wound margin is distinct with the outline attached to the wound base. There is  large (67-100%) pink, pale granulation within the wound bed. There is no necrotic tissue within the wound bed. Assessment Active Problems ICD-10 Type 2 diabetes mellitus with foot ulcer Chronic multifocal osteomyelitis, left ankle and foot Non-pressure chronic ulcer of other part of left foot with other specified severity Primary osteoarthritis, left ankle and foot Charcot's joint, left ankle and foot Procedures Wound #3 Pre-procedure diagnosis of Wound #3 is a Diabetic Wound/Ulcer of the Lower Extremity located on the Left,Plantar Foot .Severity of Tissue Pre Debridement is: Fat layer exposed. There was a Excisional Skin/Subcutaneous Tissue Debridement with a total area of 12.25 sq cm performed by Worthy Keeler, PA. With the following instrument(s): Curette to remove Viable and Non-Viable tissue/material. Material removed includes Callus, Subcutaneous Tissue, Slough, Skin: Dermis, Skin: Epidermis, and Fibrin/Exudate. A time out was conducted at 15:15, prior to the start of the procedure. A Moderate amount of bleeding was controlled with Silver Nitrate. The procedure was tolerated well with a pain level of 0 throughout and a pain level of 0 following the procedure. Post Debridement Measurements: 3.2cm length x 3.3cm width x 0.1cm depth; 0.829cm^3 volume. Character of Wound/Ulcer Post Debridement is improved. Severity of Tissue Post Debridement is: Fat layer exposed. Post procedure Diagnosis Wound #3: Same as Pre-Procedure Plan Follow-up Appointments: Return Appointment in 1 week. Margarita Grizzle Wednesday Bathing/ Shower/ Hygiene: May shower and wash wound with soap and water. - prior to dressing change Edema Control - Lymphedema / SCD / Other: Elevate legs to the level of the heart or above for 30 minutes daily and/or when sitting, a frequency of: - throughout the day Avoid standing for long periods of time. Exercise regularly Moisturize legs daily. Compression stocking or Garment 30-40 mm/Hg  pressure to: - wear the Juxtalite HD to right and left leg. apply in the morning and remove at night. Off-Loading: Open toe surgical shoe to: - left foot Other: - minimal weight bearing left foot use wheelchair for mobility. Additional Orders / Instructions: Follow Nutritious Diet Home Health: New wound care orders this week; continue Home Health for wound care. May utilize formulary equivalent dressing for wound treatment orders unless otherwise specified. - Advance home health WOUND #3: - Foot Wound Laterality: Plantar, Left Cleanser: Soap and Water [facility]) Every Other Day/15 Days Discharge Instructions: May shower and wash wound with dial antibacterial soap and water prior to dressing change. Cleanser: Wound Cleanser (Home Health) (Generic) Every Other Day/15 Days Discharge Instructions: Cleanse the wound with wound cleanser prior to applying a clean dressing using gauze sponges, not tissue or cotton balls. Peri-Wound Care: Zinc Oxide Ointment 30g tube [facility]) Every Other Day/15 Days Discharge Instructions: Apply Zinc Oxide as needed for any maceration or redness to periwound with each dressing change Peri-Wound Care: Sween Lotion (Moisturizing lotion) (Home Health) Every Other Day/15 Days Discharge Instructions: Apply moisturizing lotion as directed Prim Dressing: Hydrofera Blue Ready Foam, 2.5 x2.5 in Every Other Day/15 Days ary Discharge Instructions: Apply to wound bed as instructed Secondary Dressing: Zetuvit Plus 4x8 in [facility]) (Generic) Every Other Day/15 Days Discharge Instructions: Apply over primary dressing as directed. Secured With: [employer], 4.5x3.1 (in/yd) (Home Health) (Generic) Every Other Day/15 Days Discharge Instructions:  Secure with Kerlix as directed. Secured With: 57M Medipore [name] Soft Cloth Surgical T ape, 2x2 (in/yd) (Home Health) (Generic) Every Other Day/15 Days Discharge Instructions: Secure dressing with tape as directed. 1. Would  recommend currently based on the patient's recommendation and desire that we try the [facility] for the next week and see how things do. It could help to stimulate some additional faster growth nodes getting somewhat weary of how slow this is to heal. Nonetheless if we do not see significant improvement then we will see about going back to the alginate following week. 2. I am also can recommend that we have the patient continue with the Zetuvit followed by the roll gauze to secure in place. We will see patient back for reevaluation in 1 week here in the clinic. If anything worsens or changes patient will contact our office for additional recommendations. Electronic Signature(s) Signed: 10/21/2021 3:33:20 PM By: Worthy Keeler PA-C Entered By: Worthy Keeler on 10/21/2021 15:33:20 -------------------------------------------------------------------------------- SuperBill Details Patient Name: Date of Service: HERBERTH, MANUELE [name] 10/21/2021 Medical Record Number: [id number] Patient Account Number: [id number] Date of Birth/Sex: Treating RN: [id number] (61 y.o. [name] Primary Care Provider: Billey Gosling Other Clinician: Referring Provider: Treating Provider/Extender: Adele Schilder in Treatment: 19 Diagnosis Coding ICD-10 Codes Code Description E11.621 Type 2 diabetes mellitus with foot ulcer M86.372 Chronic multifocal osteomyelitis, left ankle and foot L97.528 Non-pressure chronic ulcer of other part of left foot with other specified severity M19.072 Primary osteoarthritis, left ankle and foot M14.672 Charcot's joint, left ankle and foot Facility Procedures CPT4 Code: [id number] Description: [id number] - DEB SUBQ TISSUE 20 SQ CM/< ICD-10 Diagnosis Description L97.528 Non-pressure chronic ulcer of other part of left foot with other specified seve Modifier: rity Quantity: 1 Physician Procedures : CPT4 Code Description Modifier [id number] - WC PHYS SUBQ TISS 20 SQ  CM ICD-10 Diagnosis Description L97.528 Non-pressure chronic ulcer of other part of left foot with other specified severity Quantity: 1 Electronic Signature(s) Signed: 10/21/2021 3:33:42 PM By: Worthy Keeler PA-C Entered By: Worthy Keeler on 10/21/2021 15:33:41

## 2021-10-24 MED ORDER — METHOCARBAMOL 500 MG PO TABS: 500.0000 mg | ORAL_TABLET | Freq: Three times a day (TID) | ORAL | 0 refills | Status: DC | PRN

## 2021-10-27 NOTE — Progress Notes (Signed)
[name], [name] ([id number]) Visit Report for 10/21/2021 Arrival Information Details Patient Name: Date of Service: [name], [name] 10/21/2021 2:30 PM Medical Record Number: [id number] Patient Account Number: [id number] Date of Birth/Sex: Treating RN: Jul 17, 1961 (61 y.o. [name] Primary Care Montrel Donahoe: Billey Gosling Other Clinician: Referring Jeremy Ditullio: Treating Cassie Shedlock/Extender: [name] in Treatment: 19 Visit Information History Since Last Visit Added or deleted any medications: No Patient Arrived: Walker Any new allergies or adverse reactions: No Arrival Time: 14:55 Had a fall or experienced change in No Accompanied By: self activities of daily living that may affect Transfer Assistance: None risk of falls: Patient Identification Verified: Yes Signs or symptoms of abuse/neglect since No Secondary Verification Process Completed: Yes last visito Patient Requires Transmission-Based Precautions: No Hospitalized since last visit: No Patient Has Alerts: Yes Implantable device outside of the clinic No excluding cellular tissue based products placed in the center since last visit: Has Dressing in Place as Prescribed: Yes Has Footwear/Offloading in Place as Yes Prescribed: Left: Surgical Shoe with Pressure Relief Insole Pain Present Now: No Electronic Signature(s) Signed: 10/21/2021 5:44:25 PM By: Deon Pilling RN, BSN Entered By: Deon Pilling on 10/21/2021 15:00:35 -------------------------------------------------------------------------------- Encounter Discharge Information Details Patient Name: Date of Service: [name] IG 10/21/2021 2:30 PM Medical Record Number: [id number] Patient Account Number: [id number] Date of Birth/Sex: Treating RN: May 24, 1961 (61 y.o. [name] Primary Care Kaileb Monsanto: Billey Gosling Other Clinician: Referring Anacaren Kohan: Treating [place] Souter/Extender: [name] in Treatment: 19 Encounter  Discharge Information Items Post Procedure Vitals Discharge Condition: Stable Temperature (F): 98 Ambulatory Status: Walker Pulse (bpm): 86 Discharge Destination: Home Respiratory Rate (breaths/min): 20 Transportation: Private Auto Blood Pressure (mmHg): 126/85 Accompanied By: self Schedule Follow-up Appointment: Yes Clinical Summary of Care: Electronic Signature(s) Signed: 10/21/2021 5:44:25 PM By: Deon Pilling RN, BSN Entered By: Deon Pilling on 10/21/2021 15:25:38 -------------------------------------------------------------------------------- Lower Extremity Assessment Details Patient Name: Date of Service: [name], [name] IG 10/21/2021 2:30 PM Medical Record Number: [id number] Patient Account Number: [id number] Date of Birth/Sex: Treating RN: June 25, 1961 (61 y.o. [name] Primary Care Tereasa Yilmaz: Billey Gosling Other Clinician: Referring Robbin Loughmiller: Treating Yola Paradiso/Extender: [name] Weeks in Treatment: 19 Edema Assessment Assessed: [Left: Yes] [Right: No] Edema: [Left: Ye] [Right: s] Calf Left: Right: Point of Measurement: 36 cm From Medial Instep 42 cm Ankle Left: Right: Point of Measurement: 13 cm From Medial Instep 30 cm Vascular Assessment Pulses: Dorsalis Pedis Palpable: [Left:Yes] Electronic Signature(s) Signed: 10/21/2021 5:44:25 PM By: Deon Pilling RN, BSN Entered By: Deon Pilling on 10/21/2021 15:01:13 -------------------------------------------------------------------------------- Multi-Disciplinary Care Plan Details Patient Name: Date of Service: [name] IG 10/21/2021 2:30 PM Medical Record Number: [id number] Patient Account Number: [id number] Date of Birth/Sex: Treating RN: 08/02/1961 (61 y.o. [name] Primary Care Makail Watling: Billey Gosling Other Clinician: Referring Ioan Landini: Treating Jhaden Pizzuto/Extender: [name] in Treatment: [place] reviewed with physician Active  Inactive Nutrition Nursing Diagnoses: Impaired glucose control: actual or potential Potential for alteratiion in Nutrition/Potential for imbalanced nutrition Goals: Patient/caregiver agrees to and verbalizes understanding of need to use nutritional supplements and/or vitamins as prescribed Date Initiated: 06/04/2021 Date Inactivated: 07/07/2021 Target Resolution Date: 07/04/2021 Goal Status: Met Patient/caregiver will maintain therapeutic glucose control Date Initiated: 06/04/2021 Target Resolution Date: 12/18/2021 Goal Status: Active Interventions: Assess HgA1c results as ordered upon admission and as needed Assess patient nutrition upon admission and as needed per policy Provide education on elevated blood sugars and impact on wound healing Notes: 07/07/21:  Glucose control ongoing Electronic Signature(s) Signed: 10/21/2021 5:44:25 PM By: Deon Pilling RN, BSN Entered By: Deon Pilling on 10/21/2021 15:02:02 -------------------------------------------------------------------------------- Pain Assessment Details Patient Name: Date of Service: [name], [name] IG 10/21/2021 2:30 PM Medical Record Number: [id number] Patient Account Number: [id number] Date of Birth/Sex: Treating RN: August 05, 1961 (61 y.o. [name] Primary Care Adamaris King: Billey Gosling Other Clinician: Referring Binnie Vonderhaar: Treating [name]/Extender: [name] in Treatment: 19 Active Problems Location of Pain Severity and Description of Pain Patient Has Paino No Site Locations Rate the pain. Current Pain Level: 0 Pain Management and Medication Current Pain Management: Medication: No Cold Application: No Rest: No Massage: No Activity: No T.E.N.S.: No Heat Application: No Leg drop or elevation: No Is the Current Pain Management Adequate: Adequate How does your wound impact your activities of daily livingo Sleep: No Bathing: No Appetite: No Relationship With Others: No Bladder  Continence: No Emotions: No Bowel Continence: No Work: No Toileting: No Drive: No Dressing: No Hobbies: No Engineer, maintenance) Signed: 10/21/2021 5:44:25 PM By: Deon Pilling RN, BSN Entered By: Deon Pilling on 10/21/2021 15:01:02 -------------------------------------------------------------------------------- Patient/Caregiver Education Details Patient Name: Date of Service: [name] 1/4/2023andnbsp2:30 PM Medical Record Number: [id number] Patient Account Number: [id number] Date of Birth/Gender: Treating RN: 1961/06/21 (61 y.o. [name] Primary Care Physician: Billey Gosling Other Clinician: Referring Physician: Treating Physician/Extender: [name] in Treatment: 53 Education Assessment Education Provided To: Patient Education Topics Provided Wound/Skin Impairment: Handouts: Skin Care Do's and Dont's Methods: Explain/Verbal Responses: Reinforcements needed Electronic Signature(s) Signed: 10/21/2021 5:44:25 PM By: Deon Pilling RN, BSN Entered By: Deon Pilling on 10/21/2021 15:02:15 -------------------------------------------------------------------------------- Wound Assessment Details Patient Name: Date of Service: [name] IG 10/21/2021 2:30 PM Medical Record Number: [id number] Patient Account Number: [id number] Date of Birth/Sex: Treating RN: [birth year] (61 y.o. [name] Primary Care Sayla Golonka: Billey Gosling Other Clinician: Referring Makenze Ellett: Treating Cambre Matson/Extender: [name] Weeks in Treatment: 19 Wound Status Wound Number: 3 Primary Diabetic Wound/Ulcer of the Lower Extremity Etiology: Wound Location: Left, Plantar Foot Wound Open Wounding Event: Gradually Appeared Status: Date Acquired: 03/18/2021 Comorbid Anemia, Sleep Apnea, Congestive Heart Failure, Hypertension, Weeks Of Treatment: 19 History: Peripheral Venous Disease, Cirrhosis , Type II Diabetes, Clustered Wound: No  Osteoarthritis, Neuropathy Photos Wound Measurements Length: (cm) 3.2 Width: (cm) 3.3 Depth: (cm) 0.1 Area: (cm) 8.294 Volume: (cm) 0.829 % Reduction in Area: -17.3% % Reduction in Volume: -17.3% Epithelialization: Small (1-33%) Tunneling: No Undermining: No Wound Description Classification: Grade 3 Wound Margin: Distinct, outline attached Exudate Amount: Large Exudate Type: Serosanguineous Exudate Color: red, brown Foul Odor After Cleansing: No Slough/Fibrino No Wound Bed Granulation Amount: Large (67-100%) Exposed Structure Granulation Quality: Pink, Pale Fascia Exposed: No Necrotic Amount: None Present (0%) Fat Layer (Subcutaneous Tissue) Exposed: Yes Tendon Exposed: No Muscle Exposed: No Joint Exposed: No Bone Exposed: No Treatment Notes Wound #3 (Foot) Wound Laterality: Plantar, Left Cleanser Soap and Water Discharge Instruction: May shower and wash wound with dial antibacterial soap and water prior to dressing change. Wound Cleanser Discharge Instruction: Cleanse the wound with wound cleanser prior to applying a clean dressing using gauze sponges, not tissue or cotton balls. Peri-Wound Care Zinc Oxide Ointment 30g tube Discharge Instruction: Apply Zinc Oxide as needed for any maceration or redness to periwound with each dressing change Sween Lotion (Moisturizing lotion) Discharge Instruction: Apply moisturizing lotion as directed Topical Primary Dressing Hydrofera Blue Ready Foam, 2.5 x2.5 in Discharge Instruction: Apply to wound bed as instructed  Secondary Dressing Zetuvit Plus 4x8 in Discharge Instruction: Apply over primary dressing as directed. Secured With [employer], 4.5x3.1 (in/yd) Discharge Instruction: Secure with Kerlix as directed. 69M Medipore H Soft Cloth Surgical T ape, 2x2 (in/yd) Discharge Instruction: Secure dressing with tape as directed. Compression Wrap Compression Stockings Add-Ons Electronic Signature(s) Signed:  10/21/2021 5:44:25 PM By: Deon Pilling RN, BSN Signed: 10/27/2021 3:01:20 PM By: Sandre Kitty Entered By: Sandre Kitty on 10/21/2021 15:03:59 -------------------------------------------------------------------------------- Vitals Details Patient Name: Date of Service: [name], [name] IG 10/21/2021 2:30 PM Medical Record Number: [id number] Patient Account Number: [id number] Date of Birth/Sex: Treating RN: 07/07/1961 (61 y.o. [name] Primary Care Demara Lover: Billey Gosling Other Clinician: Referring Shantil Vallejo: Treating Keaun Schnabel/Extender: [name] in Treatment: 19 Vital Signs Time Taken: 14:55 Temperature (F): 98 Weight (lbs): 270 Pulse (bpm): 86 Respiratory Rate (breaths/min): 20 Blood Pressure (mmHg): 126/85 Reference Range: 80 - 120 mg / dl Electronic Signature(s) Signed: 10/21/2021 5:44:25 PM By: Deon Pilling RN, BSN Entered By: Deon Pilling on 10/21/2021 15:00:51

## 2021-10-28 ENCOUNTER — Encounter (HOSPITAL_BASED_OUTPATIENT_CLINIC_OR_DEPARTMENT_OTHER): Payer: Medicare Other | Admitting: Physician Assistant

## 2021-10-28 ENCOUNTER — Telehealth: Payer: Self-pay | Admitting: Neurology

## 2021-10-28 NOTE — Telephone Encounter (Signed)
Dr. Lucia Gaskins is taking off 2/27, so I called the patient and left a voice mail that I moved his appointment up to 2/16 at 1:30pm and to call us back if this new date will not work for him.

## 2021-10-29 ENCOUNTER — Inpatient Hospital Stay: Admission: RE | Admit: 2021-10-29 | Payer: Medicare Other | Source: Ambulatory Visit

## 2021-11-04 ENCOUNTER — Other Ambulatory Visit: Payer: Self-pay | Admitting: Internal Medicine

## 2021-11-10 ENCOUNTER — Telehealth: Payer: Medicare Other | Admitting: Physician Assistant

## 2021-11-10 DIAGNOSIS — M5442 Lumbago with sciatica, left side: Secondary | ICD-10-CM

## 2021-11-10 NOTE — Progress Notes (Signed)
Based on what you shared with me, I feel your condition warrants further evaluation and I recommend that you be seen in a face to face visit.  We cannot prescribe any narcotics or controlled substances within eVisits. If you feel that you need these, a face to face with a provider is required.    Unfortunately, the best medication we could offer through virtual visits would be ibuprofen 800mg  or Naproxen 500mg , and you are already on a muscle relaxer. If pain is this significant you should be seen in person. We do apologize for this inconvenience.    NOTE: There will be NO CHARGE for this eVisit   If you are having a true medical emergency please call 911.      For an urgent face to face visit, [facility] has six urgent care centers for your convenience:     [facility] [place] [place] Get Driving Directions [id number] [place], [place] [address]    [facility]) Get Driving Directions [id number] [place], [place] [address]  [facility] [facility] Get Driving Directions [id number] [address] [place],  [place]  [id number]  [facility] Get Driving Directions [id number] [place], [place], [place] [address]   [facility] Get Driving Directions  [id number] [address] [place], [place] [address]   [facility] [place] [facility] Get Driving Directions [id number] [address], [place], [place] [address]  Your MyChart E-visit questionnaire answers were reviewed by a board certified advanced clinical practitioner to complete your personal care plan based on your specific symptoms.  Thank you for using e-Visits.   I provided 5 minutes of non face-to-face time during this encounter for chart review and documentation.

## 2021-11-13 ENCOUNTER — Other Ambulatory Visit: Payer: Self-pay

## 2021-11-13 ENCOUNTER — Emergency Department (HOSPITAL_COMMUNITY): Payer: Medicare Other

## 2021-11-13 ENCOUNTER — Encounter (HOSPITAL_COMMUNITY): Payer: Self-pay

## 2021-11-13 ENCOUNTER — Inpatient Hospital Stay (HOSPITAL_COMMUNITY)
Admission: EM | Admit: 2021-11-13 | Discharge: 2021-11-19 | DRG: 291 | Disposition: A | Discharge status: Home Health | Payer: Medicare Other | Attending: Internal Medicine | Admitting: Internal Medicine

## 2021-11-13 DIAGNOSIS — I10 Essential (primary) hypertension: Secondary | ICD-10-CM | POA: Diagnosis not present

## 2021-11-13 DIAGNOSIS — R778 Other specified abnormalities of plasma proteins: Secondary | ICD-10-CM | POA: Diagnosis not present

## 2021-11-13 DIAGNOSIS — Z96643 Presence of artificial hip joint, bilateral: Secondary | ICD-10-CM | POA: Diagnosis present

## 2021-11-13 DIAGNOSIS — E785 Hyperlipidemia, unspecified: Secondary | ICD-10-CM | POA: Diagnosis present

## 2021-11-13 DIAGNOSIS — M86672 Other chronic osteomyelitis, left ankle and foot: Secondary | ICD-10-CM | POA: Diagnosis present

## 2021-11-13 DIAGNOSIS — E662 Morbid (severe) obesity with alveolar hypoventilation: Secondary | ICD-10-CM | POA: Diagnosis present

## 2021-11-13 DIAGNOSIS — R7989 Other specified abnormal findings of blood chemistry: Secondary | ICD-10-CM | POA: Diagnosis present

## 2021-11-13 DIAGNOSIS — G4733 Obstructive sleep apnea (adult) (pediatric): Secondary | ICD-10-CM | POA: Diagnosis present

## 2021-11-13 DIAGNOSIS — Z79899 Other long term (current) drug therapy: Secondary | ICD-10-CM

## 2021-11-13 DIAGNOSIS — I214 Non-ST elevation (NSTEMI) myocardial infarction: Secondary | ICD-10-CM

## 2021-11-13 DIAGNOSIS — K219 Gastro-esophageal reflux disease without esophagitis: Secondary | ICD-10-CM | POA: Diagnosis present

## 2021-11-13 DIAGNOSIS — M5416 Radiculopathy, lumbar region: Secondary | ICD-10-CM | POA: Diagnosis not present

## 2021-11-13 DIAGNOSIS — R319 Hematuria, unspecified: Secondary | ICD-10-CM | POA: Diagnosis not present

## 2021-11-13 DIAGNOSIS — F1020 Alcohol dependence, uncomplicated: Secondary | ICD-10-CM

## 2021-11-13 DIAGNOSIS — Z79891 Long term (current) use of opiate analgesic: Secondary | ICD-10-CM

## 2021-11-13 DIAGNOSIS — F419 Anxiety disorder, unspecified: Secondary | ICD-10-CM | POA: Diagnosis present

## 2021-11-13 DIAGNOSIS — K760 Fatty (change of) liver, not elsewhere classified: Secondary | ICD-10-CM | POA: Diagnosis present

## 2021-11-13 DIAGNOSIS — I110 Hypertensive heart disease with heart failure: Principal | ICD-10-CM | POA: Diagnosis present

## 2021-11-13 DIAGNOSIS — I5042 Chronic combined systolic (congestive) and diastolic (congestive) heart failure: Secondary | ICD-10-CM | POA: Diagnosis present

## 2021-11-13 DIAGNOSIS — E039 Hypothyroidism, unspecified: Secondary | ICD-10-CM | POA: Diagnosis present

## 2021-11-13 DIAGNOSIS — E669 Obesity, unspecified: Secondary | ICD-10-CM | POA: Diagnosis present

## 2021-11-13 DIAGNOSIS — L97429 Non-pressure chronic ulcer of left heel and midfoot with unspecified severity: Secondary | ICD-10-CM

## 2021-11-13 DIAGNOSIS — G8929 Other chronic pain: Secondary | ICD-10-CM | POA: Diagnosis present

## 2021-11-13 DIAGNOSIS — Z8739 Personal history of other diseases of the musculoskeletal system and connective tissue: Secondary | ICD-10-CM

## 2021-11-13 DIAGNOSIS — I5032 Chronic diastolic (congestive) heart failure: Secondary | ICD-10-CM | POA: Diagnosis present

## 2021-11-13 DIAGNOSIS — I5021 Acute systolic (congestive) heart failure: Secondary | ICD-10-CM | POA: Diagnosis present

## 2021-11-13 DIAGNOSIS — F10930 Alcohol use, unspecified with withdrawal, uncomplicated: Secondary | ICD-10-CM

## 2021-11-13 DIAGNOSIS — E1169 Type 2 diabetes mellitus with other specified complication: Secondary | ICD-10-CM | POA: Diagnosis present

## 2021-11-13 DIAGNOSIS — I5043 Acute on chronic combined systolic (congestive) and diastolic (congestive) heart failure: Secondary | ICD-10-CM | POA: Diagnosis present

## 2021-11-13 DIAGNOSIS — K921 Melena: Secondary | ICD-10-CM | POA: Diagnosis not present

## 2021-11-13 DIAGNOSIS — D696 Thrombocytopenia, unspecified: Secondary | ICD-10-CM | POA: Diagnosis present

## 2021-11-13 DIAGNOSIS — E876 Hypokalemia: Secondary | ICD-10-CM | POA: Diagnosis present

## 2021-11-13 DIAGNOSIS — R58 Hemorrhage, not elsewhere classified: Secondary | ICD-10-CM | POA: Diagnosis not present

## 2021-11-13 DIAGNOSIS — Z560 Unemployment, unspecified: Secondary | ICD-10-CM

## 2021-11-13 DIAGNOSIS — F10239 Alcohol dependence with withdrawal, unspecified: Secondary | ICD-10-CM | POA: Diagnosis present

## 2021-11-13 DIAGNOSIS — Z6838 Body mass index (BMI) 38.0-38.9, adult: Secondary | ICD-10-CM

## 2021-11-13 DIAGNOSIS — Y92410 Unspecified street and highway as the place of occurrence of the external cause: Secondary | ICD-10-CM

## 2021-11-13 DIAGNOSIS — Z89422 Acquired absence of other left toe(s): Secondary | ICD-10-CM

## 2021-11-13 DIAGNOSIS — L97529 Non-pressure chronic ulcer of other part of left foot with unspecified severity: Secondary | ICD-10-CM | POA: Diagnosis present

## 2021-11-13 DIAGNOSIS — M1990 Unspecified osteoarthritis, unspecified site: Secondary | ICD-10-CM | POA: Diagnosis present

## 2021-11-13 DIAGNOSIS — M5441 Lumbago with sciatica, right side: Secondary | ICD-10-CM | POA: Diagnosis present

## 2021-11-13 DIAGNOSIS — R7401 Elevation of levels of liver transaminase levels: Secondary | ICD-10-CM

## 2021-11-13 DIAGNOSIS — Z888 Allergy status to other drugs, medicaments and biological substances status: Secondary | ICD-10-CM

## 2021-11-13 DIAGNOSIS — T45515A Adverse effect of anticoagulants, initial encounter: Secondary | ICD-10-CM | POA: Diagnosis not present

## 2021-11-13 DIAGNOSIS — Z96653 Presence of artificial knee joint, bilateral: Secondary | ICD-10-CM | POA: Diagnosis present

## 2021-11-13 DIAGNOSIS — E11621 Type 2 diabetes mellitus with foot ulcer: Secondary | ICD-10-CM | POA: Diagnosis present

## 2021-11-13 DIAGNOSIS — Z20822 Contact with and (suspected) exposure to covid-19: Secondary | ICD-10-CM | POA: Diagnosis present

## 2021-11-13 DIAGNOSIS — Z87891 Personal history of nicotine dependence: Secondary | ICD-10-CM

## 2021-11-13 DIAGNOSIS — E1142 Type 2 diabetes mellitus with diabetic polyneuropathy: Secondary | ICD-10-CM | POA: Diagnosis present

## 2021-11-13 DIAGNOSIS — Z79890 Hormone replacement therapy (postmenopausal): Secondary | ICD-10-CM

## 2021-11-13 LAB — CBC WITH DIFFERENTIAL/PLATELET
Abs Immature Granulocytes: 0.01 10*3/uL (ref 0.00–0.07)
Basophils Absolute: 0 10*3/uL (ref 0.0–0.1)
Basophils Relative: 0 %
Eosinophils Absolute: 0 10*3/uL (ref 0.0–0.5)
Eosinophils Relative: 0 %
HCT: 38 % — ABNORMAL LOW (ref 39.0–52.0)
Hemoglobin: 13.2 g/dL (ref 13.0–17.0)
Immature Granulocytes: 0 %
Lymphocytes Relative: 11 %
Lymphs Abs: 0.4 10*3/uL — ABNORMAL LOW (ref 0.7–4.0)
MCH: 31.4 pg (ref 26.0–34.0)
MCHC: 34.7 g/dL (ref 30.0–36.0)
MCV: 90.3 fL (ref 80.0–100.0)
Monocytes Absolute: 0.5 10*3/uL (ref 0.1–1.0)
Monocytes Relative: 13 %
Neutro Abs: 3 10*3/uL (ref 1.7–7.7)
Neutrophils Relative %: 76 %
Platelets: 105 10*3/uL — ABNORMAL LOW (ref 150–400)
RBC: 4.21 MIL/uL — ABNORMAL LOW (ref 4.22–5.81)
RDW: 18.1 % — ABNORMAL HIGH (ref 11.5–15.5)
WBC: 4 10*3/uL (ref 4.0–10.5)
nRBC: 0 % (ref 0.0–0.2)

## 2021-11-13 LAB — BASIC METABOLIC PANEL
Anion gap: 19 — ABNORMAL HIGH (ref 5–15)
BUN: 16 mg/dL (ref 6–20)
CO2: 22 mmol/L (ref 22–32)
Calcium: 8.9 mg/dL (ref 8.9–10.3)
Chloride: 93 mmol/L — ABNORMAL LOW (ref 98–111)
Creatinine, Ser: 0.85 mg/dL (ref 0.61–1.24)
GFR, Estimated: >60 mL/min (ref >60)
Glucose, Bld: 103 mg/dL — ABNORMAL HIGH (ref 70–99)
Potassium: 3 mmol/L — ABNORMAL LOW (ref 3.5–5.1)
Sodium: 134 mmol/L — ABNORMAL LOW (ref 135–145)

## 2021-11-13 LAB — URINALYSIS, ROUTINE W REFLEX MICROSCOPIC
Bacteria, UA: NONE SEEN
Bilirubin Urine: NEGATIVE
Glucose, UA: NEGATIVE mg/dL
Ketones, ur: 80 mg/dL — AB
Leukocytes,Ua: NEGATIVE
Nitrite: NEGATIVE
Protein, ur: 100 mg/dL — AB
Specific Gravity, Urine: 1.017 (ref 1.005–1.030)
pH: 6 (ref 5.0–8.0)

## 2021-11-13 LAB — BRAIN NATRIURETIC PEPTIDE: B Natriuretic Peptide: 87.6 pg/mL (ref 0.0–100.0)

## 2021-11-13 LAB — TROPONIN I (HIGH SENSITIVITY)
Troponin I (High Sensitivity): 926 ng/L (ref <18)
Troponin I (High Sensitivity): 1131 ng/L (ref <18)

## 2021-11-13 LAB — RESP PANEL BY RT-PCR (FLU A&B, COVID) ARPGX2
Influenza A by PCR: NEGATIVE
Influenza B by PCR: NEGATIVE
SARS Coronavirus 2 by RT PCR: NEGATIVE

## 2021-11-13 LAB — CBG MONITORING, ED: Glucose-Capillary: 111 mg/dL — ABNORMAL HIGH (ref 70–99)

## 2021-11-13 LAB — ETHANOL: Alcohol, Ethyl (B): <10 mg/dL (ref <10)

## 2021-11-13 MED ORDER — OXYCODONE HCL 5 MG PO TABS
5.0000 mg | ORAL_TABLET | ORAL | Status: DC | PRN
  Administered 2021-11-14 – 2021-11-18 (×11): 5 mg via ORAL
  Filled 2021-11-13 (×11): qty 1

## 2021-11-13 MED ORDER — LORAZEPAM 1 MG PO TABS
1.0000 mg | ORAL_TABLET | ORAL | Status: AC | PRN
  Filled 2021-11-13: qty 1

## 2021-11-13 MED ORDER — ASPIRIN EC 81 MG PO TBEC
81.0000 mg | DELAYED_RELEASE_TABLET | Freq: Every day | ORAL | Status: DC
  Administered 2021-11-14 – 2021-11-16 (×3): 81 mg via ORAL
  Filled 2021-11-13 (×3): qty 1

## 2021-11-13 MED ORDER — LORAZEPAM 1 MG PO TABS: 0.0000 mg | ORAL_TABLET | Freq: Two times a day (BID) | ORAL | Status: AC

## 2021-11-13 MED ORDER — ENOXAPARIN SODIUM 40 MG/0.4ML IJ SOSY
40.0000 mg | PREFILLED_SYRINGE | INTRAMUSCULAR | Status: DC
  Administered 2021-11-14: 40 mg via SUBCUTANEOUS
  Filled 2021-11-13: qty 0.4

## 2021-11-13 MED ORDER — FOLIC ACID 1 MG PO TABS
1.0000 mg | ORAL_TABLET | Freq: Every day | ORAL | Status: DC
  Administered 2021-11-14 – 2021-11-19 (×6): 1 mg via ORAL
  Filled 2021-11-13 (×6): qty 1

## 2021-11-13 MED ORDER — METOPROLOL TARTRATE 25 MG PO TABS
25.0000 mg | ORAL_TABLET | Freq: Two times a day (BID) | ORAL | Status: AC
  Administered 2021-11-13 – 2021-11-17 (×9): 25 mg via ORAL
  Filled 2021-11-13 (×9): qty 1

## 2021-11-13 MED ORDER — ASPIRIN 81 MG PO CHEW
324.0000 mg | CHEWABLE_TABLET | Freq: Once | ORAL | Status: AC
  Administered 2021-11-13: 324 mg via ORAL
  Filled 2021-11-13: qty 4

## 2021-11-13 MED ORDER — ACETAMINOPHEN 325 MG PO TABS: 650.0000 mg | ORAL_TABLET | ORAL | Status: DC | PRN

## 2021-11-13 MED ORDER — NITROGLYCERIN 0.4 MG SL SUBL: 0.4000 mg | SUBLINGUAL_TABLET | SUBLINGUAL | Status: DC | PRN

## 2021-11-13 MED ORDER — LORAZEPAM 2 MG/ML IJ SOLN: 1.0000 mg | INTRAMUSCULAR | Status: AC | PRN

## 2021-11-13 MED ORDER — ADULT MULTIVITAMIN W/MINERALS CH
1.0000 | ORAL_TABLET | Freq: Every day | ORAL | Status: DC
  Administered 2021-11-14 – 2021-11-19 (×6): 1 via ORAL
  Filled 2021-11-13 (×6): qty 1

## 2021-11-13 MED ORDER — KETOROLAC TROMETHAMINE 30 MG/ML IJ SOLN
30.0000 mg | Freq: Once | INTRAMUSCULAR | Status: AC
Start: 2021-11-13 — End: 2021-11-13
  Administered 2021-11-13: 30 mg via INTRAVENOUS
  Filled 2021-11-13: qty 1

## 2021-11-13 MED ORDER — SODIUM CHLORIDE 0.9 % IV BOLUS
1000.0000 mL | Freq: Once | INTRAVENOUS | Status: AC
  Administered 2021-11-13: 1000 mL via INTRAVENOUS

## 2021-11-13 MED ORDER — THIAMINE HCL 100 MG/ML IJ SOLN
100.0000 mg | Freq: Every day | INTRAMUSCULAR | Status: DC
  Filled 2021-11-13: qty 2

## 2021-11-13 MED ORDER — LORAZEPAM 2 MG/ML IJ SOLN
1.0000 mg | Freq: Once | INTRAMUSCULAR | Status: AC
  Administered 2021-11-13: 1 mg via INTRAVENOUS
  Filled 2021-11-13: qty 1

## 2021-11-13 MED ORDER — LORAZEPAM 1 MG PO TABS
0.0000 mg | ORAL_TABLET | Freq: Four times a day (QID) | ORAL | Status: AC
  Administered 2021-11-13 – 2021-11-14 (×2): 1 mg via ORAL
  Filled 2021-11-13: qty 1

## 2021-11-13 MED ORDER — POTASSIUM CHLORIDE CRYS ER 20 MEQ PO TBCR
30.0000 meq | EXTENDED_RELEASE_TABLET | Freq: Once | ORAL | Status: AC
  Administered 2021-11-13: 30 meq via ORAL
  Filled 2021-11-13: qty 1

## 2021-11-13 MED ORDER — ONDANSETRON HCL 4 MG/2ML IJ SOLN
4.0000 mg | Freq: Once | INTRAMUSCULAR | Status: AC
  Administered 2021-11-13: 4 mg via INTRAVENOUS
  Filled 2021-11-13: qty 2

## 2021-11-13 MED ORDER — POTASSIUM CHLORIDE 10 MEQ/100ML IV SOLN
10.0000 meq | INTRAVENOUS | Status: AC
  Administered 2021-11-13 – 2021-11-14 (×2): 10 meq via INTRAVENOUS
  Filled 2021-11-13 (×2): qty 100

## 2021-11-13 MED ORDER — THIAMINE HCL 100 MG PO TABS
100.0000 mg | ORAL_TABLET | Freq: Every day | ORAL | Status: DC
  Administered 2021-11-14 – 2021-11-19 (×6): 100 mg via ORAL
  Filled 2021-11-13 (×6): qty 1

## 2021-11-13 NOTE — ED Notes (Signed)
Dr. Langston Masker informed of patient's troponin of 929

## 2021-11-13 NOTE — ED Notes (Signed)
Dr. Langston Masker informed of patients troponin of 1131

## 2021-11-13 NOTE — ED Provider Notes (Signed)
[facility]-EMERGENCY DEPT Provider Note   CSN: [id number] Arrival date & time: 11/13/21  1930     History  Chief Complaint  Patient presents with   Weakness    [name] is a 61 y.o. male presenting from home with generalized weakness.  The patient's girlfriend and he lives with called the EMS to bring him in because he was complaining of feeling weak and short of breath.  The patient reports he has just been in jail for the past 3 days because he was arrested, returned home this evening, and his girlfriend called EMS.  He does feel tremulous tonight.  He states he does drink alcohol, typically vodka mixed drinks, but states his last drink was about a week ago.  He says that "sometimes" he gets shaky after stopping drinking.  He has felt generally weak, complains of difficulty walking due to sciatica on his back.  He has been using a wheelchair most of this evening.  While he was in jail for the past 3 days he states that he did not have any of his medications.  HPI     Home Medications Prior to Admission medications   Medication Sig Start Date End Date Taking? Authorizing Provider  acamprosate (CAMPRAL) 333 MG tablet Take 333 mg by mouth 2 (two) times daily. 04/01/21   [provider]  amLODipine (NORVASC) 5 MG tablet Take 1 tablet (5 mg total) by mouth daily. 09/06/21 10/06/21  Darlin DropHall, Carole N, DO  amoxicillin-clavulanate (AUGMENTIN) 875-125 MG tablet Take 1 tablet by mouth 2 (two) times daily. 09/21/21   Randall HissVan Dam, Cornelius N, MD  folic acid (FOLVITE) 1 MG tablet Take 1 tablet (1 mg total) by mouth daily. 09/06/21 12/05/21  Darlin DropHall, Carole N, DO  furosemide (LASIX) 40 MG tablet TAKE 1 TABLET BY MOUTH TWICE A DAY 10/14/21   Pincus SanesBurns, Stacy J, MD  gabapentin (NEURONTIN) 300 MG capsule Take 1 capsule (300 mg total) by mouth 2 (two) times daily. 09/09/21   Burns, Bobette MoStacy J, MD  HEMP OIL-VANILLYL BUTYL ETHER EX Apply 1 application topically 2 (two) times daily as  needed (arthritis pain).    [provider]  hydrOXYzine (ATARAX/VISTARIL) 25 MG tablet Take 25 mg by mouth 2 (two) times daily. 03/10/21   [provider]  levothyroxine (SYNTHROID) 50 MCG tablet Take 50 mcg by mouth daily before breakfast.    [provider]  LORazepam (ATIVAN) 1 MG tablet Take 1 mg by mouth daily as needed for anxiety. 08/09/21   [provider]  meloxicam (MOBIC) 15 MG tablet TAKE 1 TABLET BY MOUTH EVERY DAY WITH FOOD 11/04/21   Pincus SanesBurns, Stacy J, MD  methocarbamol (ROBAXIN) 500 MG tablet Take 1 tablet (500 mg total) by mouth every 8 (eight) hours as needed for muscle spasms. 10/24/21   Pincus SanesBurns, Stacy J, MD  metoprolol tartrate (LOPRESSOR) 25 MG tablet Take 1 tablet (25 mg total) by mouth 2 (two) times daily. 09/05/21 10/05/21  Darlin DropHall, Carole N, DO  Multiple Vitamin (MULTIVITAMIN WITH MINERALS) TABS tablet Take 1 tablet by mouth daily. 09/06/21 12/05/21  Darlin DropHall, Carole N, DO  oxyCODONE (ROXICODONE) 5 MG immediate release tablet Take 1 tablet (5 mg total) by mouth every 8 (eight) hours as needed (Chronic back pain). 10/06/21   Pincus SanesBurns, Stacy J, MD  potassium chloride SA (KLOR-CON M20) 20 MEQ tablet Take 3 tablets (60 mEq total) by mouth 2 (two) times daily. 05/05/21 09/01/22  Arrien, York RamMauricio Daniel, MD  thiamine 100 MG tablet  Take 1 tablet (100 mg total) by mouth daily. 09/06/21 12/05/21  Darlin Drop, DO  traZODone (DESYREL) 50 MG tablet TAKE 1-2 TABLETS (50-100 MG TOTAL) BY MOUTH AT BEDTIME AS NEEDED FOR SLEEP. Patient taking differently: Take 50-100 mg by mouth See admin instructions. Take 50mg  to 100mg  by mouth at bedtime as needed for sleep 12/31/19   , MD  vitamin B-12 (CYANOCOBALAMIN) 100 MCG tablet Take 100 mcg by mouth daily.    [provider]      Allergies    Claritin [loratadine]    Review of Systems   Review of Systems  Physical Exam Updated Vital Signs BP (!) 147/101    Pulse 86    Temp 98.3 F (36.8 C) (Oral)     Resp (!) 24    Ht 6\' 1"  (1.854 m)    Wt 120.2 kg    SpO2 96%    BMI 34.96 kg/m  Physical Exam Constitutional:      General: He is not in acute distress.    Appearance: He is obese.  HENT:     Head: Normocephalic and atraumatic.  Eyes:     Conjunctiva/sclera: Conjunctivae normal.     Pupils: Pupils are equal, round, and reactive to light.  Cardiovascular:     Rate and Rhythm: Regular rhythm. Tachycardia present.  Pulmonary:     Effort: Pulmonary effort is normal. No respiratory distress.  Abdominal:     General: There is no distension.     Tenderness: There is no abdominal tenderness.  Musculoskeletal:     Comments: Venous stasis dermatitis of the lower extremity  Skin:    General: Skin is warm and dry.  Neurological:     General: No focal deficit present.     Mental Status: He is alert. Mental status is at baseline.     Comments: Bilateral arms tremulousness  Psychiatric:        Mood and Affect: Mood normal.        Behavior: Behavior normal.    ED Results / Procedures / Treatments   Labs (all labs ordered are listed, but only abnormal results are displayed) Labs Reviewed  BASIC METABOLIC PANEL - Abnormal; Notable for the following components:      Result Value   Sodium 134 (*)    Potassium 3.0 (*)    Chloride 93 (*)    Glucose, Bld 103 (*)    Anion gap 19 (*)    All other components within normal limits  URINALYSIS, ROUTINE W REFLEX MICROSCOPIC - Abnormal; Notable for the following components:   Hgb urine dipstick MODERATE (*)    Ketones, ur 80 (*)    Protein, ur 100 (*)    All other components within normal limits  CBC WITH DIFFERENTIAL/PLATELET - Abnormal; Notable for the following components:   RBC 4.21 (*)    HCT 38.0 (*)    RDW 18.1 (*)    Platelets 105 (*)    Lymphs Abs 0.4 (*)    All other components within normal limits  CBG MONITORING, ED - Abnormal; Notable for the following components:   Glucose-Capillary 111 (*)    All other components within  normal limits  TROPONIN I (HIGH SENSITIVITY) - Abnormal; Notable for the following components:   Troponin I (High Sensitivity) 926 (*)    All other components within normal limits  TROPONIN I (HIGH SENSITIVITY) - Abnormal; Notable for the following components:   Troponin I (High Sensitivity) 1,131 (*)  All other components within normal limits  RESP PANEL BY RT-PCR (FLU A&B, COVID) ARPGX2  BRAIN NATRIURETIC PEPTIDE  ETHANOL  MAGNESIUM  MAGNESIUM  PHOSPHORUS  CBC  COMPREHENSIVE METABOLIC PANEL  AMMONIA    EKG EKG Interpretation  Date/Time:  Friday November 13 2021 19:39:32 EST Ventricular Rate:  102 PR Interval:  187 QRS Duration: 101 QT Interval:  377 QTC Calculation: 492 R Axis:   28 Text Interpretation: Sinus tachycardia Low voltage, precordial leads Borderline prolonged QT interval Confirmed by Alvester Chou [phone]) on 11/13/2021 7:54:31 PM  Radiology DG Chest Portable 1 View  Result Date: 11/13/2021 CLINICAL DATA:  Generalized weakness, shortness of breath EXAM: PORTABLE CHEST 1 VIEW COMPARISON:  09/02/2021 FINDINGS: Transverse diameter of heart is increased. There are no signs of pulmonary edema or focal pulmonary consolidation. There is no pleural effusion or pneumothorax. IMPRESSION: No active disease is seen in the chest. Electronically Signed   By: Ernie Avena M.D.   On: 11/13/2021 20:14    Procedures Procedures    Medications Ordered in ED Medications  potassium chloride 10 mEq in 100 mL IVPB (10 mEq Intravenous New Bag/Given 11/13/21 2308)  metoprolol tartrate (LOPRESSOR) tablet 25 mg (25 mg Oral Given 11/13/21 2334)  LORazepam (ATIVAN) tablet 1-4 mg (has no administration in time range)    Or  LORazepam (ATIVAN) injection 1-4 mg (has no administration in time range)  thiamine tablet 100 mg (has no administration in time range)    Or  thiamine (B-1) injection 100 mg (has no administration in time range)  folic acid (FOLVITE) tablet 1 mg (has no  administration in time range)  multivitamin with minerals tablet 1 tablet (has no administration in time range)  aspirin EC tablet 81 mg (has no administration in time range)  nitroGLYCERIN (NITROSTAT) SL tablet 0.4 mg (has no administration in time range)  acetaminophen (TYLENOL) tablet 650 mg (has no administration in time range)  LORazepam (ATIVAN) tablet 0-4 mg (1 mg Oral Given 11/13/21 2341)    Followed by  LORazepam (ATIVAN) tablet 0-4 mg (has no administration in time range)  enoxaparin (LOVENOX) injection 40 mg (has no administration in time range)  oxyCODONE (Oxy IR/ROXICODONE) immediate release tablet 5 mg (has no administration in time range)  sodium chloride 0.9 % bolus 1,000 mL (0 mLs Intravenous Stopped 11/13/21 2237)  LORazepam (ATIVAN) injection 1 mg (1 mg Intravenous Given 11/13/21 2147)  ondansetron (ZOFRAN) injection 4 mg (4 mg Intravenous Given 11/13/21 2143)  ketorolac (TORADOL) 30 MG/ML injection 30 mg (30 mg Intravenous Given 11/13/21 2157)  aspirin chewable tablet 324 mg (324 mg Oral Given 11/13/21 2310)  potassium chloride (KLOR-CON M) CR tablet 30 mEq (30 mEq Oral Given 11/13/21 2333)    ED Course/ Medical Decision Making/ A&P Clinical Course as of 11/13/21 2359  Fri Nov 13, 2021  2157 Patient's trop elevated - he has no CP on reassessment, ecg reassuring, unclear etiology for NSTEMI at this time, will discuss with cardiology.  Repeat trop being drawn now. [MT]  2244 Patient remains chest pain-free on my reevaluation.  We will place a consult for admission [MT]  2244 I spoke to Dr Jinger Neighbors from cardiology who advised that if the patient is chest pain-free, and given his normal EKG, would be reasonable to hold off on heparin at this time.  He agrees with trending of troponin, feels the patient benefit from an echocardiogram; if there is significant elevation in troponin, hospital team could consider transfer to [facility] for consideration  of catheterization, but no immediate  plan for this otherwise [MT]  2257 Second troponin with only minor elevation over the first, now 1131 - stable for admission at this time [MT]  2306 Pt admitted to Dr Minerva Areolaypd hospitalist [MT]    Clinical Course User Index [MT] Renaye Rakersrifan, Kermit BaloMatthew J, MD                           Medical Decision Making Amount and/or Complexity of Data Reviewed Labs: ordered. Radiology: ordered.  Risk Prescription drug management. Decision regarding hospitalization.   This patient presents to the ED with concern for generalized weakness.  This involves an extensive number of treatment options, and is a complaint that carries with it a high risk of complications and morbidity.  The differential diagnosis includes withdrawal symptoms versus anemia versus dehydration versus worsening chronic medical conditions in the setting of missed medications for 3 days.  Additional history obtained from EMS on the patient's arrival  External records from outside source obtained and reviewed including hospital discharge summary from November 2022, which notes the patient does have a history of alcohol use disorder and withdrawal syndrome, chronic osteomyelitis, chronic back pain, and at that time he had been admitted for alcohol withdrawal.  He had chronic foot wound issues and had negative blood cultures at that time.  I ordered and personally interpreted labs.  The pertinent results include: Elevated troponins, see above  I ordered imaging studies including x-ray of the chest I independently visualized and interpreted imaging which showed focal infiltrate I agree with the radiologist interpretation  The patient was maintained on a cardiac monitor.  I personally viewed and interpreted the cardiac monitored which showed an underlying rhythm of: Sinus tachycardia  Per my interpretation the patient's ECG shows sinus tachycardia without acute ischemic findings  I ordered medication including Ativan for suspected alcohol  withdrawal; IV fluids and Zofran and Toradol for nausea and chronic back pain.  IV and oral potassium for hypokalemia.  I have reviewed the patients home medicines and have made adjustments as needed  Test Considered:  -Overall lower suspicion for acute pulmonary embolism with no tachypnea, no hypoxia, which I would otherwise expect from a pulmonary embolism large enough to cause this level of heart strain.  We will continue to monitor, if the patient does develop respiratory symptoms or hypoxia, can consider CT PE scan at that time.  After the interventions noted above, I reevaluated the patient and found that they have: improved  Social Determinants of Health:Chronic alcohol use disorder  Dispostion:  After consideration of the diagnostic results and the patients response to treatment, I feel that the patent would benefit from hospitalization, echocardiogram, cardiology consult.         Final Clinical Impression(s) / ED Diagnoses Final diagnoses:  NSTEMI (non-ST elevated myocardial infarction) [facility](HCC)    Rx / DC Orders ED Discharge Orders     None         Gracemarie Skeet, Kermit BaloMatthew J, MD 11/14/21 0001

## 2021-11-13 NOTE — ED Triage Notes (Signed)
PER EMS: pt is from home with c/o generalized weakness; family called EMS because he was too weak and SOB to get from wheelchair into his house. Has not been able to take his meds in two days.  Has ulcer to left foot, followed by wound care BP- 152/90, HR-106, 97% RA, CBG-120, A&OX4

## 2021-11-14 ENCOUNTER — Encounter (HOSPITAL_COMMUNITY): Payer: Self-pay | Admitting: Family Medicine

## 2021-11-14 ENCOUNTER — Observation Stay (HOSPITAL_COMMUNITY): Payer: Medicare Other

## 2021-11-14 DIAGNOSIS — E11621 Type 2 diabetes mellitus with foot ulcer: Secondary | ICD-10-CM | POA: Diagnosis present

## 2021-11-14 DIAGNOSIS — E876 Hypokalemia: Secondary | ICD-10-CM | POA: Diagnosis present

## 2021-11-14 DIAGNOSIS — I214 Non-ST elevation (NSTEMI) myocardial infarction: Secondary | ICD-10-CM | POA: Diagnosis not present

## 2021-11-14 DIAGNOSIS — I110 Hypertensive heart disease with heart failure: Secondary | ICD-10-CM | POA: Diagnosis present

## 2021-11-14 DIAGNOSIS — M86672 Other chronic osteomyelitis, left ankle and foot: Secondary | ICD-10-CM | POA: Diagnosis present

## 2021-11-14 DIAGNOSIS — F10930 Alcohol use, unspecified with withdrawal, uncomplicated: Secondary | ICD-10-CM | POA: Insufficient documentation

## 2021-11-14 DIAGNOSIS — K760 Fatty (change of) liver, not elsewhere classified: Secondary | ICD-10-CM | POA: Diagnosis present

## 2021-11-14 DIAGNOSIS — Y92410 Unspecified street and highway as the place of occurrence of the external cause: Secondary | ICD-10-CM | POA: Diagnosis not present

## 2021-11-14 DIAGNOSIS — D696 Thrombocytopenia, unspecified: Secondary | ICD-10-CM | POA: Diagnosis present

## 2021-11-14 DIAGNOSIS — E1142 Type 2 diabetes mellitus with diabetic polyneuropathy: Secondary | ICD-10-CM | POA: Diagnosis present

## 2021-11-14 DIAGNOSIS — I5043 Acute on chronic combined systolic (congestive) and diastolic (congestive) heart failure: Secondary | ICD-10-CM | POA: Diagnosis present

## 2021-11-14 DIAGNOSIS — R778 Other specified abnormalities of plasma proteins: Secondary | ICD-10-CM | POA: Diagnosis present

## 2021-11-14 DIAGNOSIS — G8929 Other chronic pain: Secondary | ICD-10-CM | POA: Diagnosis present

## 2021-11-14 DIAGNOSIS — K921 Melena: Secondary | ICD-10-CM | POA: Diagnosis not present

## 2021-11-14 DIAGNOSIS — M5441 Lumbago with sciatica, right side: Secondary | ICD-10-CM | POA: Diagnosis present

## 2021-11-14 DIAGNOSIS — G4733 Obstructive sleep apnea (adult) (pediatric): Secondary | ICD-10-CM | POA: Diagnosis present

## 2021-11-14 DIAGNOSIS — M1990 Unspecified osteoarthritis, unspecified site: Secondary | ICD-10-CM | POA: Diagnosis present

## 2021-11-14 DIAGNOSIS — I5032 Chronic diastolic (congestive) heart failure: Secondary | ICD-10-CM | POA: Diagnosis not present

## 2021-11-14 DIAGNOSIS — L97529 Non-pressure chronic ulcer of other part of left foot with unspecified severity: Secondary | ICD-10-CM | POA: Diagnosis present

## 2021-11-14 DIAGNOSIS — F419 Anxiety disorder, unspecified: Secondary | ICD-10-CM | POA: Diagnosis present

## 2021-11-14 DIAGNOSIS — E785 Hyperlipidemia, unspecified: Secondary | ICD-10-CM | POA: Diagnosis present

## 2021-11-14 DIAGNOSIS — I5021 Acute systolic (congestive) heart failure: Secondary | ICD-10-CM | POA: Diagnosis not present

## 2021-11-14 DIAGNOSIS — E669 Obesity, unspecified: Secondary | ICD-10-CM | POA: Diagnosis present

## 2021-11-14 DIAGNOSIS — Z20822 Contact with and (suspected) exposure to covid-19: Secondary | ICD-10-CM | POA: Diagnosis present

## 2021-11-14 DIAGNOSIS — R0602 Shortness of breath: Secondary | ICD-10-CM

## 2021-11-14 DIAGNOSIS — E1169 Type 2 diabetes mellitus with other specified complication: Secondary | ICD-10-CM | POA: Diagnosis present

## 2021-11-14 DIAGNOSIS — R319 Hematuria, unspecified: Secondary | ICD-10-CM | POA: Diagnosis not present

## 2021-11-14 DIAGNOSIS — K219 Gastro-esophageal reflux disease without esophagitis: Secondary | ICD-10-CM | POA: Diagnosis present

## 2021-11-14 DIAGNOSIS — F10239 Alcohol dependence with withdrawal, unspecified: Secondary | ICD-10-CM | POA: Diagnosis present

## 2021-11-14 DIAGNOSIS — F10230 Alcohol dependence with withdrawal, uncomplicated: Secondary | ICD-10-CM | POA: Diagnosis not present

## 2021-11-14 DIAGNOSIS — M5416 Radiculopathy, lumbar region: Secondary | ICD-10-CM | POA: Diagnosis not present

## 2021-11-14 LAB — DIFFERENTIAL
Basophils Absolute: 0 10*3/uL (ref 0.0–0.1)
Basophils Relative: 1 %
Eosinophils Absolute: 0 10*3/uL (ref 0.0–0.5)
Eosinophils Relative: 0 %
Lymphocytes Relative: 20 %
Lymphs Abs: 0.8 10*3/uL (ref 0.7–4.0)
Monocytes Absolute: 0.6 10*3/uL (ref 0.1–1.0)
Monocytes Relative: 14 %
Neutro Abs: 2.8 10*3/uL (ref 1.7–7.7)
Neutrophils Relative %: 65 %

## 2021-11-14 LAB — COMPREHENSIVE METABOLIC PANEL
ALT: 58 U/L — ABNORMAL HIGH (ref 0–44)
AST: 103 U/L — ABNORMAL HIGH (ref 15–41)
Albumin: 3.5 g/dL (ref 3.5–5.0)
Alkaline Phosphatase: 187 U/L — ABNORMAL HIGH (ref 38–126)
Anion gap: 16 — ABNORMAL HIGH (ref 5–15)
BUN: 18 mg/dL (ref 6–20)
CO2: 22 mmol/L (ref 22–32)
Calcium: 8.2 mg/dL — ABNORMAL LOW (ref 8.9–10.3)
Chloride: 98 mmol/L (ref 98–111)
Creatinine, Ser: 0.87 mg/dL (ref 0.61–1.24)
GFR, Estimated: >60 mL/min (ref >60)
Glucose, Bld: 75 mg/dL (ref 70–99)
Potassium: 3 mmol/L — ABNORMAL LOW (ref 3.5–5.1)
Sodium: 136 mmol/L (ref 135–145)
Total Bilirubin: 3.3 mg/dL — ABNORMAL HIGH (ref 0.3–1.2)
Total Protein: 7.2 g/dL (ref 6.5–8.1)

## 2021-11-14 LAB — CBC
HCT: 36.9 % — ABNORMAL LOW (ref 39.0–52.0)
Hemoglobin: 12.5 g/dL — ABNORMAL LOW (ref 13.0–17.0)
MCH: 31.3 pg (ref 26.0–34.0)
MCHC: 33.9 g/dL (ref 30.0–36.0)
MCV: 92.3 fL (ref 80.0–100.0)
Platelets: 94 10*3/uL — ABNORMAL LOW (ref 150–400)
RBC: 4 MIL/uL — ABNORMAL LOW (ref 4.22–5.81)
RDW: 18.4 % — ABNORMAL HIGH (ref 11.5–15.5)
WBC: 4.2 10*3/uL (ref 4.0–10.5)
nRBC: 0 % (ref 0.0–0.2)

## 2021-11-14 LAB — ECHOCARDIOGRAM COMPLETE
Area-P 1/2: 4.31 cm2
Calc EF: 60.7 %
Height: 73 in
S' Lateral: 3.7 cm
Single Plane A2C EF: 66.7 %
Single Plane A4C EF: 55.6 %
Weight: 4240 oz

## 2021-11-14 LAB — TECHNOLOGIST SMEAR REVIEW: Plt Morphology: NORMAL

## 2021-11-14 LAB — TROPONIN I (HIGH SENSITIVITY)
Troponin I (High Sensitivity): 1576 ng/L (ref <18)
Troponin I (High Sensitivity): 1107 ng/L (ref <18)
Troponin I (High Sensitivity): 634 ng/L (ref <18)

## 2021-11-14 LAB — AMMONIA: Ammonia: 29 umol/L (ref 9–35)

## 2021-11-14 LAB — PHOSPHORUS: Phosphorus: 2.7 mg/dL (ref 2.5–4.6)

## 2021-11-14 LAB — LACTIC ACID, PLASMA: Lactic Acid, Venous: 1 mmol/L (ref 0.5–1.9)

## 2021-11-14 LAB — HEPARIN LEVEL (UNFRACTIONATED): Heparin Unfractionated: 0.12 IU/mL — ABNORMAL LOW (ref 0.30–0.70)

## 2021-11-14 LAB — CK: Total CK: 654 U/L — ABNORMAL HIGH (ref 49–397)

## 2021-11-14 LAB — MAGNESIUM
Magnesium: 1.6 mg/dL — ABNORMAL LOW (ref 1.7–2.4)
Magnesium: 1.8 mg/dL (ref 1.7–2.4)

## 2021-11-14 MED ORDER — HEPARIN BOLUS VIA INFUSION
3000.0000 [IU] | Freq: Once | INTRAVENOUS | Status: AC
  Administered 2021-11-15: 3000 [IU] via INTRAVENOUS
  Filled 2021-11-14: qty 3000

## 2021-11-14 MED ORDER — AMOXICILLIN-POT CLAVULANATE 875-125 MG PO TABS
1.0000 | ORAL_TABLET | Freq: Two times a day (BID) | ORAL | Status: DC
  Administered 2021-11-14 – 2021-11-19 (×12): 1 via ORAL
  Filled 2021-11-14 (×12): qty 1

## 2021-11-14 MED ORDER — METHOCARBAMOL 500 MG PO TABS
500.0000 mg | ORAL_TABLET | Freq: Three times a day (TID) | ORAL | Status: DC | PRN
  Administered 2021-11-14 – 2021-11-18 (×6): 500 mg via ORAL
  Filled 2021-11-14 (×6): qty 1

## 2021-11-14 MED ORDER — POTASSIUM CHLORIDE CRYS ER 20 MEQ PO TBCR
20.0000 meq | EXTENDED_RELEASE_TABLET | Freq: Two times a day (BID) | ORAL | Status: DC
  Administered 2021-11-14: 20 meq via ORAL
  Filled 2021-11-14: qty 1

## 2021-11-14 MED ORDER — LACTATED RINGERS IV SOLN: INTRAVENOUS | Status: DC

## 2021-11-14 MED ORDER — FUROSEMIDE 10 MG/ML IJ SOLN
40.0000 mg | Freq: Once | INTRAMUSCULAR | Status: AC
  Administered 2021-11-14: 40 mg via INTRAVENOUS
  Filled 2021-11-14: qty 4

## 2021-11-14 MED ORDER — HEPARIN BOLUS VIA INFUSION
4000.0000 [IU] | Freq: Once | INTRAVENOUS | Status: AC
  Administered 2021-11-14: 4000 [IU] via INTRAVENOUS
  Filled 2021-11-14: qty 4000

## 2021-11-14 MED ORDER — HEPARIN (PORCINE) 25000 UT/250ML-% IV SOLN
1600.0000 [IU]/h | INTRAVENOUS | Status: DC
  Administered 2021-11-14: 1250 [IU]/h via INTRAVENOUS
  Administered 2021-11-15: 1600 [IU]/h via INTRAVENOUS
  Filled 2021-11-14 (×2): qty 250

## 2021-11-14 MED ORDER — TRAZODONE HCL 100 MG PO TABS
50.0000 mg | ORAL_TABLET | Freq: Every evening | ORAL | Status: DC | PRN
  Administered 2021-11-14 – 2021-11-18 (×5): 100 mg via ORAL
  Filled 2021-11-14 (×5): qty 1

## 2021-11-14 MED ORDER — AMLODIPINE BESYLATE 5 MG PO TABS
5.0000 mg | ORAL_TABLET | Freq: Every day | ORAL | Status: DC
  Administered 2021-11-14 – 2021-11-19 (×6): 5 mg via ORAL
  Filled 2021-11-14 (×6): qty 1

## 2021-11-14 MED ORDER — ATORVASTATIN CALCIUM 40 MG PO TABS
80.0000 mg | ORAL_TABLET | Freq: Every day | ORAL | Status: DC
  Administered 2021-11-14 – 2021-11-18 (×5): 80 mg via ORAL
  Filled 2021-11-14 (×5): qty 2

## 2021-11-14 MED ORDER — POTASSIUM CHLORIDE CRYS ER 20 MEQ PO TBCR
40.0000 meq | EXTENDED_RELEASE_TABLET | ORAL | Status: AC
  Administered 2021-11-14 (×2): 40 meq via ORAL
  Filled 2021-11-14 (×2): qty 2

## 2021-11-14 MED ORDER — PERFLUTREN LIPID MICROSPHERE
1.0000 mL | INTRAVENOUS | Status: AC | PRN
  Administered 2021-11-14: 4 mL via INTRAVENOUS
  Filled 2021-11-14: qty 10
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
## 2021-11-14 NOTE — Assessment & Plan Note (Addendum)
Platelet count has been running low lately.  Currently remaining stable at his baseline. Patient had some bleeding from his urethra. Heparin stopped.  Monitor platelet count.  No indication for transfusion for now.

## 2021-11-14 NOTE — Plan of Care (Signed)
Report received from ED nurse. Admission completed. Wd dsg change completed by student nurse under supervision of instructor. Pt oriented to room and call bell.Heparin gtt started.   Problem: Education: Goal: Knowledge of General Education information will improve Description: Including pain rating scale, medication(s)/side effects and non-pharmacologic comfort measures Outcome: Progressing   Problem: Health Behavior/Discharge Planning: Goal: Ability to manage health-related needs will improve Outcome: Progressing   Problem: Clinical Measurements: Goal: Ability to maintain clinical measurements within normal limits will improve Outcome: Progressing Goal: Will remain free from infection Outcome: Progressing Goal: Diagnostic test results will improve Outcome: Progressing Goal: Respiratory complications will improve Outcome: Progressing Goal: Cardiovascular complication will be avoided Outcome: Progressing   Problem: Activity: Goal: Risk for activity intolerance will decrease Outcome: Progressing   Problem: Nutrition: Goal: Adequate nutrition will be maintained Outcome: Progressing   Problem: Coping: Goal: Level of anxiety will decrease Outcome: Progressing   Problem: Elimination: Goal: Will not experience complications related to bowel motility Outcome: Progressing Goal: Will not experience complications related to urinary retention Outcome: Progressing   Problem: Pain Managment: Goal: General experience of comfort will improve Outcome: Progressing   Problem: Safety: Goal: Ability to remain free from injury will improve Outcome: Progressing   Problem: Skin Integrity: Goal: Risk for impaired skin integrity will decrease Outcome: Progressing

## 2021-11-14 NOTE — Assessment & Plan Note (Addendum)
Blood pressure relatively well controlled. On Norvasc.  Toprol-XL and losartan per cardiology.

## 2021-11-14 NOTE — Assessment & Plan Note (Addendum)
With obstructive sleep apnea. Body mass index is 38.68 kg/m.  Placing the patient at high risk for poor outcome.

## 2021-11-14 NOTE — Assessment & Plan Note (Addendum)
Last drink Wednesday 1/25. Continue CIWA protocol although CIWA score is low for now. Likely out of the withdrawal period. Monitor.

## 2021-11-14 NOTE — Assessment & Plan Note (Signed)
Continue pain regimen for now.

## 2021-11-14 NOTE — Assessment & Plan Note (Addendum)
Chronic diastolic CHF Elevated troponin Presentation with fatigue, orthopnea, PND. Baseline weight 265 pounds, on admission 285 pounds. Troponin ranging from [phone]- 634. EKG no evidence of ischemia.  No chest pain. Echocardiogram was performed which shows evidence of systolic dysfunction as well as wall motion abnormality which were not present on prior echocardiogram. Cardiology consulted. Patient was initially started on IV heparin but due to bleeding it was stopped. Cardiology recommending outpatient follow-up and work-up currently signed off.

## 2021-11-14 NOTE — ED Notes (Signed)
Pt got his breakfast tray °

## 2021-11-14 NOTE — Assessment & Plan Note (Addendum)
Hypomagnesemia. Potassium level now stable. Magnesium was also low which was replaced.
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
## 2021-11-14 NOTE — Assessment & Plan Note (Addendum)
LDL 69. Continue Lipitor. Monitor for worsening liver function.

## 2021-11-14 NOTE — Progress Notes (Addendum)
ANTICOAGULATION CONSULT NOTE - Initial Consult  Pharmacy Consult for heparin Indication: chest pain/ACS  Allergies  Allergen Reactions   Claritin [Loratadine] Shortness Of Breath and Anxiety    Patient Measurements: Height: 6\' 1"  (185.4 cm) Weight: 120.2 kg (265 lb) IBW/kg (Calculated) : 79.9 Heparin Dosing Weight: 106 kg  Vital Signs: Temp: 98.7 F (37.1 C) (01/28 1545) Temp Source: Oral (01/28 1545) BP: 128/77 (01/28 1545) Pulse Rate: 77 (01/28 1545)  Labs: Recent Labs    11/13/21 1942 11/13/21 1954 11/13/21 2154 11/14/21 0200 11/14/21 0500 11/14/21 0846  HGB  --  13.2  --   --  12.5*  --   HCT  --  38.0*  --   --  36.9*  --   PLT  --  105*  --   --  94*  --   CREATININE 0.85  --   --   --  0.87  --   CKTOTAL  --   --   --   --   --  654*  TROPONINIHS  --  926*   < > 1,576* 1,107* 634*   < > = values in this interval not displayed.    Estimated Creatinine Clearance: 122.6 mL/min (by C-G formula based on SCr of 0.87 mg/dL).   Medical History: Past Medical History:  Diagnosis Date   Alcohol dependence (H[facility])    Arthritis    hips, hands   Bilateral carpal tunnel syndrome    Bilateral leg edema    Chronic   CHF (congestive heart failure) (HCC)    Diabetes mellitus without complication (HCC)    type 2   Diverticulitis    portion of colon removed   DOE (dyspnea on exertion)    occ   Elevated liver enzymes    Fatty liver    GERD (gastroesophageal reflux disease)    occ   Hammer toe    Hip pain    History of ventral hernia repair 2016   x2   Hyperlipidemia    pt unsure   Hypertension    Marijuana abuse    Morbid obesity (H[facility])    Neuromuscular disorder (H[facility])    peripheral neuropathy feet and few fingers   OSA (obstructive sleep apnea)    has OSA-not used CPAP 2-3 yrs could not tolerate cpap   PONV (postoperative nausea and vomiting)    Toe ulcer (H[facility])    left healed    Medications: Patient is not prescribed anticoagulants  PTA  Assessment: Patient is a 16 yoM presenting with tremors, fatigue, SOB. PMH significant for EtOH dependence. Thrombocytopenia appears chronic, likely related to alcoholism per H&P note. Pharmacy consulted to dose heparin for ACS.   Today, 11/14/21 CBC: Hgb slightly low; Plt 94  Troponin: 926 > 1131 > 1576 > 1107 > 634 SCr WNL Enoxaparin 40 mg subQ for DVT ppx given @ 1113 this morning. Has since been discontinued  Goal of Therapy:  Heparin level 0.3-0.7 units/ml Monitor platelets by anticoagulation protocol: Yes   Plan:  Give 4000 units bolus x 1 Start heparin infusion at 1250 units/hr Check anti-Xa level in 6 hours and daily while on heparin Continue to monitor H&H and platelets Monitor for signs of bleeding  11/16/21, PharmD 11/14/2021,3:48 PM

## 2021-11-14 NOTE — Assessment & Plan Note (Addendum)
With chronic ulcer of the left heel. Appreciate wound care consultation. On Augmentin. Likely antibiotic induced diarrhea. No abdominal pain no nausea.  Added Florastor and Imodium.
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
## 2021-11-14 NOTE — Consult Note (Signed)
WOC Nurse Consult Note: Reason for Consult: Full thickness wound to left foot, plantar aspect.  Followed in the outpatient wound care center. Last seen in that setting by Allen Derry, III, PA-C on 10/21/21. POC from that provider was [facility], an antimicrobial dressing that we do not carry in house.   I will implement a POC using an alternate antimicrobial. Patient to return to the outpatient [facility] as appropriate post discharge. Dr. Allena Katz has provided photos today of the patient's wound and uploaded to the EMR. Wound type: Neuropathic Pressure Injury POA: N/A Measurement: Per WCC last visit on 10/21/21"  3.2cm x 2.2cm x 0.1cm  Wound bed:red, nongranulating Drainage (amount, consistency, odor) moderate serous Periwound: mild callus formation Dressing procedure/placement/frequency: As noted above, we do not carry the topical antimicrobial absorbant dressing used in the [facility], so I will implement another absorbent antimicrobial (silver hydrofiber (Aquacel Ag+ Amalia Greenhouse # [id number]).  There are no contraindications for its use.  GUidence provided for Nursing for daily dressing changes.   Recommend return to outpatient wound care center as directed post discharge for continuing follow up of this wound.  WOC nursing team will not follow, but will remain available to this patient, the nursing and medical teams.  Please re-consult if needed. Thanks, Ladona Mow, MSN, RN, GNP, [place]  Pager# [phone]

## 2021-11-14 NOTE — Progress Notes (Signed)
ANTICOAGULATION CONSULT NOTE - follow up  Pharmacy Consult for heparin Indication: chest pain/ACS  Allergies  Allergen Reactions   Claritin [Loratadine] Shortness Of Breath and Anxiety    Patient Measurements: Height: 6\' 1"  (185.4 cm) Weight: 129.4 kg (285 lb 4.4 oz) IBW/kg (Calculated) : 79.9 Heparin Dosing Weight: 106 kg  Vital Signs: Temp: 98.2 F (36.8 C) (01/28 2037) Temp Source: Oral (01/28 2037) BP: 148/79 (01/28 2037) Pulse Rate: 88 (01/28 2037)  Labs: Recent Labs    11/13/21 1942 11/13/21 1954 11/13/21 2154 11/14/21 0200 11/14/21 0500 11/14/21 0846 11/14/21 2256  HGB  --  13.2  --   --  12.5*  --   --   HCT  --  38.0*  --   --  36.9*  --   --   PLT  --  105*  --   --  94*  --   --   HEPARINUNFRC  --   --   --   --   --   --  0.12*  CREATININE 0.85  --   --   --  0.87  --   --   CKTOTAL  --   --   --   --   --  654*  --   TROPONINIHS  --  926*   < > 1,576* 1,107* 634*  --    < > = values in this interval not displayed.     Estimated Creatinine Clearance: 127.3 mL/min (by C-G formula based on SCr of 0.87 mg/dL).   Medical History: Past Medical History:  Diagnosis Date   Alcohol dependence (HCC)    Arthritis    hips, hands   Bilateral carpal tunnel syndrome    Bilateral leg edema    Chronic   CHF (congestive heart failure) (HCC)    Diabetes mellitus without complication (HCC)    type 2   Diverticulitis    portion of colon removed   DOE (dyspnea on exertion)    occ   Elevated liver enzymes    Fatty liver    GERD (gastroesophageal reflux disease)    occ   Hammer toe    Hip pain    History of ventral hernia repair 2016   x2   Hyperlipidemia    pt unsure   Hypertension    Marijuana abuse    Morbid obesity (HCC)    Neuromuscular disorder (HCC)    peripheral neuropathy feet and few fingers   OSA (obstructive sleep apnea)    has OSA-not used CPAP 2-3 yrs could not tolerate cpap   PONV (postoperative nausea and vomiting)    Toe ulcer  (HCC)    left healed    Medications: Patient is not prescribed anticoagulants PTA  Assessment: Patient is a 89 yoM presenting with tremors, fatigue, SOB. PMH significant for EtOH dependence. Thrombocytopenia appears chronic, likely related to alcoholism per H&P note. Pharmacy consulted to dose heparin for ACS.   Today, 11/14/21 CBC: Hgb slightly low; Plt 94  Troponin: 926 > 1131 > 1576 > 1107 > 634 SCr WNL Enoxaparin 40 mg subQ for DVT ppx given @ 1113 this morning. Has since been discontinued 1st HL 0.12 subtherapeutic on 1250 units/hr Per RN no bleeding or interruptions  Goal of Therapy:  Heparin level 0.3-0.7 units/ml Monitor platelets by anticoagulation protocol: Yes   Plan:  Heparin bolus 3000 units x 1 Increase heparin drip to 1600 units/hr Heparin level in 6 hours Daily CBC Monitor for signs of bleeding  11/16/21  Jean Rosenthal RPh 11/14/2021, 11:52 PM

## 2021-11-14 NOTE — ED Notes (Signed)
Per Dr. Posey Pronto, hold on transport to floor at this time while determining if patient needs transfer to [facility] for cardio.

## 2021-11-15 DIAGNOSIS — I214 Non-ST elevation (NSTEMI) myocardial infarction: Secondary | ICD-10-CM

## 2021-11-15 LAB — COMPREHENSIVE METABOLIC PANEL
ALT: 52 U/L — ABNORMAL HIGH (ref 0–44)
AST: 87 U/L — ABNORMAL HIGH (ref 15–41)
Albumin: 3.3 g/dL — ABNORMAL LOW (ref 3.5–5.0)
Alkaline Phosphatase: 170 U/L — ABNORMAL HIGH (ref 38–126)
Anion gap: 14 (ref 5–15)
BUN: 14 mg/dL (ref 6–20)
CO2: 23 mmol/L (ref 22–32)
Calcium: 8.2 mg/dL — ABNORMAL LOW (ref 8.9–10.3)
Chloride: 95 mmol/L — ABNORMAL LOW (ref 98–111)
Creatinine, Ser: 0.81 mg/dL (ref 0.61–1.24)
GFR, Estimated: >60 mL/min (ref >60)
Glucose, Bld: 84 mg/dL (ref 70–99)
Potassium: 3 mmol/L — ABNORMAL LOW (ref 3.5–5.1)
Sodium: 132 mmol/L — ABNORMAL LOW (ref 135–145)
Total Bilirubin: 2.6 mg/dL — ABNORMAL HIGH (ref 0.3–1.2)
Total Protein: 7 g/dL (ref 6.5–8.1)

## 2021-11-15 LAB — CBC
HCT: 36.5 % — ABNORMAL LOW (ref 39.0–52.0)
Hemoglobin: 12.2 g/dL — ABNORMAL LOW (ref 13.0–17.0)
MCH: 31.2 pg (ref 26.0–34.0)
MCHC: 33.4 g/dL (ref 30.0–36.0)
MCV: 93.4 fL (ref 80.0–100.0)
Platelets: 80 10*3/uL — ABNORMAL LOW (ref 150–400)
RBC: 3.91 MIL/uL — ABNORMAL LOW (ref 4.22–5.81)
RDW: 17.9 % — ABNORMAL HIGH (ref 11.5–15.5)
WBC: 2.9 10*3/uL — ABNORMAL LOW (ref 4.0–10.5)
nRBC: 0 % (ref 0.0–0.2)

## 2021-11-15 LAB — LIPID PANEL
Cholesterol: 166 mg/dL (ref 0–200)
HDL: 83 mg/dL (ref >40)
LDL Cholesterol: 69 mg/dL (ref 0–99)
Total CHOL/HDL Ratio: 2 RATIO
Triglycerides: 72 mg/dL (ref <150)
VLDL: 14 mg/dL (ref 0–40)

## 2021-11-15 LAB — HEPARIN LEVEL (UNFRACTIONATED)
Heparin Unfractionated: 0.28 IU/mL — ABNORMAL LOW (ref 0.30–0.70)
Heparin Unfractionated: 0.34 IU/mL (ref 0.30–0.70)

## 2021-11-15 LAB — MAGNESIUM: Magnesium: 1.4 mg/dL — ABNORMAL LOW (ref 1.7–2.4)

## 2021-11-15 LAB — HEMOGLOBIN A1C
Hgb A1c MFr Bld: 5 % (ref 4.8–5.6)
Mean Plasma Glucose: 96.8 mg/dL

## 2021-11-15 MED ORDER — MAGNESIUM SULFATE 2 GM/50ML IV SOLN
2.0000 g | Freq: Once | INTRAVENOUS | Status: AC
  Administered 2021-11-15: 2 g via INTRAVENOUS
  Filled 2021-11-15: qty 50

## 2021-11-15 MED ORDER — FUROSEMIDE 10 MG/ML IJ SOLN
40.0000 mg | Freq: Once | INTRAMUSCULAR | Status: AC
  Administered 2021-11-15: 40 mg via INTRAVENOUS
  Filled 2021-11-15: qty 4

## 2021-11-15 MED ORDER — HEPARIN BOLUS VIA INFUSION
1500.0000 [IU] | Freq: Once | INTRAVENOUS | Status: AC
  Administered 2021-11-15: 1500 [IU] via INTRAVENOUS
  Filled 2021-11-15: qty 1500

## 2021-11-15 MED ORDER — HEPARIN (PORCINE) 25000 UT/250ML-% IV SOLN
2000.0000 [IU]/h | INTRAVENOUS | Status: DC
  Administered 2021-11-15 (×2): 1800 [IU]/h via INTRAVENOUS
  Administered 2021-11-16: 2000 [IU]/h via INTRAVENOUS
  Filled 2021-11-15 (×4): qty 250

## 2021-11-15 MED ORDER — POTASSIUM CHLORIDE CRYS ER 20 MEQ PO TBCR
40.0000 meq | EXTENDED_RELEASE_TABLET | Freq: Two times a day (BID) | ORAL | Status: DC
  Filled 2021-11-15: qty 2

## 2021-11-15 MED ORDER — POTASSIUM CHLORIDE CRYS ER 20 MEQ PO TBCR
40.0000 meq | EXTENDED_RELEASE_TABLET | ORAL | Status: AC
  Administered 2021-11-15 (×2): 40 meq via ORAL
  Filled 2021-11-15: qty 2

## 2021-11-15 MED ORDER — LOSARTAN POTASSIUM 25 MG PO TABS
12.5000 mg | ORAL_TABLET | Freq: Every day | ORAL | Status: DC
  Administered 2021-11-15 – 2021-11-17 (×3): 12.5 mg via ORAL
  Filled 2021-11-15 (×3): qty 1

## 2021-11-15 MED ORDER — POTASSIUM CHLORIDE CRYS ER 20 MEQ PO TBCR
60.0000 meq | EXTENDED_RELEASE_TABLET | Freq: Two times a day (BID) | ORAL | Status: DC
  Administered 2021-11-15 – 2021-11-17 (×5): 60 meq via ORAL
  Filled 2021-11-15 (×6): qty 3

## 2021-11-15 NOTE — Progress Notes (Addendum)
ANTICOAGULATION CONSULT NOTE - Initial Consult  Pharmacy Consult for heparin Indication: chest pain/ACS  Allergies  Allergen Reactions   Claritin [Loratadine] Shortness Of Breath and Anxiety    Patient Measurements: Height: 6\' 1"  (185.4 cm) Weight: 133 kg (293 lb 3.4 oz) IBW/kg (Calculated) : 79.9 Heparin Dosing Weight: 106 kg  Vital Signs: Temp: 97.7 F (36.5 C) (01/29 0428) Temp Source: Oral (01/29 0428) BP: 126/78 (01/29 0428) Pulse Rate: 86 (01/29 0428)  Labs: Recent Labs     0000 11/13/21 1942 11/13/21 1954 11/13/21 2154 11/14/21 0200 11/14/21 0500 11/14/21 0846 11/14/21 2256 11/15/21 0731  HGB   < >  --  13.2  --   --  12.5*  --   --  12.2*  HCT  --   --  38.0*  --   --  36.9*  --   --  36.5*  PLT  --   --  105*  --   --  94*  --   --  80*  HEPARINUNFRC  --   --   --   --   --   --   --  0.12* 0.28*  CREATININE  --  0.85  --   --   --  0.87  --   --  0.81  CKTOTAL  --   --   --   --   --   --  654*  --   --   TROPONINIHS  --   --  926*   < > 1,576* 1,107* 634*  --   --    < > = values in this interval not displayed.     Estimated Creatinine Clearance: 138.7 mL/min (by C-G formula based on SCr of 0.81 mg/dL).   Medical History: Past Medical History:  Diagnosis Date   Alcohol dependence (HCC)    Arthritis    hips, hands   Bilateral carpal tunnel syndrome    Bilateral leg edema    Chronic   CHF (congestive heart failure) (HCC)    Diabetes mellitus without complication (HCC)    type 2   Diverticulitis    portion of colon removed   DOE (dyspnea on exertion)    occ   Elevated liver enzymes    Fatty liver    GERD (gastroesophageal reflux disease)    occ   Hammer toe    Hip pain    History of ventral hernia repair 2016   x2   Hyperlipidemia    pt unsure   Hypertension    Marijuana abuse    Morbid obesity (HCC)    Neuromuscular disorder (HCC)    peripheral neuropathy feet and few fingers   OSA (obstructive sleep apnea)    has OSA-not used  CPAP 2-3 yrs could not tolerate cpap   PONV (postoperative nausea and vomiting)    Toe ulcer (HCC)    left healed    Medications: Patient is not prescribed anticoagulants PTA  Assessment: Patient is a 82 yoM presenting with tremors, fatigue, SOB. PMH significant for EtOH dependence. Thrombocytopenia appears chronic, likely related to alcoholism per H&P note. Pharmacy consulted to dose heparin for ACS.   Today, 11/15/21 HL = 0.28 remains slightly subtherapeutic on heparin infusion of 1600 units/hr.  CBC: Hgb slightly low but stable; Plt 80 are low and trending down slightly  Confirmed with RN that heparin infusing at correct rate. No interruptions. No signs of bleeding.  Troponin: 926 > 1131 > 1576 > 1107 > 634 SCr WNL  Cardiology consulted - confirmed plan for heparin drip to continue with cardiologist this morning.  Goal of Therapy:  Heparin level 0.3-0.7 units/ml Monitor platelets by anticoagulation protocol: Yes   Plan:  Heparin bolus of 1500 units once Increase heparin infusion to 1800 units/hr Check 6 hour HL HL, CBC daily while on heparin infusion Monitor for signs of bleeding  Cindi Carbon, PharmD 11/15/2021,9:18 AM  Addendum - Evening Follow Up:  Assessment: HL = 0.34 is therapeutic on heparin infusion of 1800 units/hr No interruptions, bleeding, issues with infusion per RN  Plan: Continue heparin infusion at current rate of 1800 units/hr Check confirmatory 6 hour HL  Cindi Carbon, PharmD 11/15/21 5:22 PM
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
## 2021-11-15 NOTE — Assessment & Plan Note (Signed)
Not tolerating CPAP.

## 2021-11-15 NOTE — Consult Note (Signed)
Cardiology Consultation:   Patient ID: [name] MRN: [id number]; DOB: [birth year]  Admit date: 11/13/2021 Date of Consult: 11/15/2021  PCP:  Binnie Rail, MD   [facility] Prov[facility] Cardiologist:  Dr Gwenlyn Found   Patient Profile:   [name] is a 61 y.o. male with a hx of HTN, tobacco abuse, chronic osteromyelitis who is being seen 11/15/2021 for the evaluation of elevated troponin at the request of Dr Posey Pronto  History of Present Illness:   Mr. Herberger 61 yo male history of tobacco use, chronic diastolic HF, EtOH dependence, HTN, chronic left foot wound/osteomyelitis admitted with tremors, fatigue, SOB. Recently incarcerated for 3 days and did not consume any alcohol over that period.    ER vitals: p 104 bp 147/112 94% RA K 3 Cr 0.85 BUN 16 WBC 4 Hgb 13.2 Plt 105 BNP 87.6 Mg 1.6 Trop 926-->1131-->1576-->1107-->634 COVID neg flu neg CXR no acute process EKG SR, no acute ischemnic changes. QTc manual 470  Echo: LVEF 45-50%, grade III dd, severe lateral hypokinesis Past Medical History:  Diagnosis Date   Alcohol dependence ([place])    Arthritis    hips, hands   Bilateral carpal tunnel syndrome    Bilateral leg edema    Chronic   CHF (congestive heart failure) (HCC)    Diabetes mellitus without complication (HCC)    type 2   Diverticulitis    portion of colon removed   DOE (dyspnea on exertion)    occ   Elevated liver enzymes    Fatty liver    GERD (gastroesophageal reflux disease)    occ   Hammer toe    Hip pain    History of ventral hernia repair 2016   x2   Hyperlipidemia    pt unsure   Hypertension    Marijuana abuse    Morbid obesity ([place])    Neuromuscular disorder ([place])    peripheral neuropathy feet and few fingers   OSA (obstructive sleep apnea)    has OSA-not used CPAP 2-3 yrs could not tolerate cpap   PONV (postoperative nausea and vomiting)    Toe ulcer ([place])    left healed    Past Surgical History:  Procedure Laterality Date   AMPUTATION TOE  Left 05/25/2018   Procedure: AMPUTATION TOE left 3rd;  Surgeon: Wylene Simmer, MD;  Location: [facility];  Service: Orthopedics;  Laterality: Left;  105mn, to follow   AMPUTATION TOE Left 06/28/2018   Procedure: Left foot revision 3rd toe amputation including 3rd metatarsal;  Surgeon: HWylene Simmer MD;  [facility]  Service: Orthopedics;  Laterality: Left;  685m   BICEPS TENDON REPAIR Left 2014   Partial   COLONOSCOPY     GRAFT APPLICATION Right 02/19/08/8119 Procedure: GRAFT APPLICATION;  Surgeon: StLandis MartinsDPM;  [facility]: [facility] Service: Podiatry;  Laterality: Right;   HERNIA REPAIR  2016   ventral   HIP CLOSED REDUCTION Right 09/01/2018   Procedure: CLOSED REDUCTION HIP;  Surgeon: RoNicholes StairsMD;  [facility];  Service: Orthopedics;  Laterality: Right;   INCISION AND DRAINAGE OF WOUND Right 03/03/2020   Procedure: IRRIGATION AND DEBRIDEMENT WOUND;  Surgeon: StLandis MartinsDPM;  [facility] Service: Podiatry;  Laterality: Right;   JOINT REPLACEMENT     b/l knees    METATARSAL HEAD EXCISION Right 03/03/2020   Procedure: IRRIGATION OF TOE AND CAUTERIZATION OF BLEEDING TOE;  Surgeon: StLandis MartinsDPM;  Location:  [facility];  Service: Podiatry;  Laterality: Right;   METATARSAL HEAD EXCISION Right 03/03/2020   Procedure: METATARSAL HEAD EXCISION SECOND TOE RIGHT;  Surgeon: Landis Martins, DPM;  Location: [facility];  Service: Podiatry;  Laterality: Right;  MAC WITH LOCAL   TOE AMPUTATION Right 09/2013   TOTAL HIP ARTHROPLASTY Right 08/16/2018   Procedure: TOTAL HIP ARTHROPLASTY ANTERIOR APPROACH;  Surgeon: Rod Can, MD;  [facility];  Service: Orthopedics;  Laterality: Right;   TOTAL HIP ARTHROPLASTY Left 11/02/2018   Procedure: TOTAL HIP ARTHROPLASTY ANTERIOR APPROACH;  Surgeon: Rod Can, MD;  [facility];  Service: Orthopedics;  Laterality: Left;    TOTAL KNEE ARTHROPLASTY     bilat       Inpatient Medications: Scheduled Meds:  amLODipine  5 mg Oral Daily   amoxicillin-clavulanate  1 tablet Oral BID   aspirin EC  81 mg Oral Daily   atorvastatin  80 mg Oral Daily   folic acid  1 mg Oral Daily   LORazepam  0-4 mg Oral Q6H   Followed by   LORazepam  0-4 mg Oral Q12H   losartan  12.5 mg Oral Daily   metoprolol tartrate  25 mg Oral BID   multivitamin with minerals  1 tablet Oral Daily   potassium chloride  40 mEq Oral BID   thiamine  100 mg Oral Daily   Or   thiamine  100 mg Intravenous Daily   Continuous Infusions:  heparin 1,600 Units/hr (11/15/21 0542)   magnesium sulfate bolus IVPB     PRN Meds: acetaminophen, LORazepam **OR** LORazepam, methocarbamol, nitroGLYCERIN, oxyCODONE, traZODone  Allergies:    Allergies  Allergen Reactions   Claritin [Loratadine] Shortness Of Breath and Anxiety    Social History:   Social History   Socioeconomic History   Marital status: Divorced    Spouse name: Not on file   Number of children: Not on file   Years of education: Not on file   Highest education level: Not on file  Occupational History   Occupation: unemployed, filing for disability  Tobacco Use   Smoking status: Former    Packs/day: 0.25    Types: Cigarettes    Start date: 10/18/1984    Quit date: 10/18/2014    Years since quitting: 7.0   Smokeless tobacco: Never   Tobacco comments:    quit 2018  Vaping Use   Vaping Use: Never used  Substance and Sexual Activity   Alcohol use: Yes    Comment: 1.75liter large bottle in 4-5 nights, liquor   Drug use: Yes    Frequency: 1.0 times per week    Types: Marijuana    Comment: "4 times a month, maybe"   Sexual activity: Not on file  Other Topics Concern   Not on file  Social History Narrative   Not on file   Social Determinants of Health   Financial Resource Strain: Not on file  Food Insecurity: No Food Insecurity   Worried About Charity fundraiser in the  Last Year: Never true   [place] in the Last Year: Never true  Transportation Needs: No Transportation Needs   Lack of Transportation (Medical): No   Lack of Transportation (Non-Medical): No  Physical Activity: Not on file  Stress: Not on file  Social Connections: Not on file  Intimate Partner Violence: Not on file    Family History:    Family History  Adopted: Yes  Family history unknown: Yes  ROS:  Please see the history of present illness.   All other ROS reviewed and negative.     Physical Exam/Data:   Vitals:   11/14/21 2037 11/15/21 0033 11/15/21 0428 11/15/21 0432  BP: (!) 148/79 125/75 126/78   Pulse: 88 82 86   Resp: _0 Temp: 98.2 F (36.8 C) 98.9 F (37.2 C) 97.7 F (36.5 C)   TempSrc: Oral Oral Oral   SpO2: 94% 93% 93%   Weight:    133 kg  Height:        Intake/Output Summary (Last 24 hours) at 11/15/2021 0843 Last data filed at 11/15/2021 0700 Gross per 24 hour  Intake 1144.34 ml  Output 1950 ml  Net -805.66 ml   Last 3 Weights 11/15/2021 11/14/2021 11/13/2021  Weight (lbs) 293 lb 3.4 oz 285 lb 4.4 oz 265 lb  Weight (kg) 133 kg 129.4 kg 120.203 kg     Body mass index is 38.68 kg/m.  General:  Well nourished, well developed, in no acute distress HEENT: normal Neck: no JVD Vascular: No carotid bruits; Distal pulses 2+ bilaterally Cardiac:  normal S1, S2; RRR; no murmur  Lungs:  clear to auscultation bilaterally, no wheezing, rhonchi or rales  Abd: soft, nontender, no hepatomegaly  Ext: no edema Musculoskeletal:  No deformities, BUE and BLE strength normal and equal Skin: warm and dry  Neuro:  CNs 2-12 intact, no focal abnormalities noted Psych:  Normal affect     Laboratory Data:  High Sensitivity Troponin:   Recent Labs  Lab 11/13/21 1954 11/13/21 2154 11/14/21 0200 11/14/21 0500 11/14/21 0846  TROPONINIHS 926* 1,131* 1,576* 1,107* 634*     Chemistry Recent Labs  Lab 11/13/21 1942 11/13/21 2337 11/14/21 0500  11/15/21 0731  NA 134*  --  136 132*  K 3.0*  --  3.0* 3.0*  CL 93*  --  98 95*  CO2 22  --  22 23  GLUCOSE 103*  --  75 84  BUN 16  --  18 14  CREATININE 0.85  --  0.87 0.81  CALCIUM 8.9  --  8.2* 8.2*  MG  --  1.6* 1.8 1.4*  GFRNONAA >60  --  >60 >60  ANIONGAP 19*  --  16* 14    Recent Labs  Lab 11/14/21 0500 11/15/21 0731  PROT 7.2 7.0  ALBUMIN 3.5 3.3*  AST 103* 87*  ALT 58* 52*  ALKPHOS 187* 170*  BILITOT 3.3* 2.6*   Lipids  Recent Labs  Lab 11/15/21 0731  CHOL 166  TRIG 72  HDL 83  LDLCALC 69  CHOLHDL 2.0    Hematology Recent Labs  Lab 11/13/21 1954 11/14/21 0500 11/15/21 0731  WBC 4.0 4.2 2.9*  RBC 4.21* 4.00* 3.91*  HGB 13.2 12.5* 12.2*  HCT 38.0* 36.9* 36.5*  MCV 90.3 92.3 93.4  MCH 31.4 31.3 31.2  MCHC 34.7 33.9 33.4  RDW 18.1* 18.4* 17.9*  PLT 105* 94* 80*   Thyroid No results for input(s): TSH, FREET4 in the last 168 hours.  BNP Recent Labs  Lab 11/13/21 1954  BNP 87.6    DDimer No results for input(s): DDIMER in the last 168 hours.   Radiology/Studies:  DG Chest Portable 1 View  Result Date: 11/13/2021 CLINICAL DATA:  Generalized weakness, shortness of breath EXAM: PORTABLE CHEST 1 VIEW COMPARISON:  09/02/2021 FINDINGS: Transverse diameter of heart is increased. There are no signs of pulmonary edema or focal pulmonary consolidation. There is no pleural effusion  or pneumothorax. IMPRESSION: No active disease is seen in the chest. Electronically Signed   By: Elmer Picker M.D.   On: 11/13/2021 20:14   ECHOCARDIOGRAM COMPLETE  Result Date: 11/14/2021    ECHOCARDIOGRAM REPORT   Patient Name:   JAVARIOUS ELSAYED Date of Exam: 11/14/2021 Medical Rec #:  [id number]    Height:       73.0 in Accession #:    [id number]   Weight:       265.0 lb Date of Birth:  03/07/1961    BSA:          2.425 m Patient Age:    26 years     BP:           143/84 mmHg Patient Gender: M            HR:           78 bpm. Exam Location:  Inpatient Procedure: 2D Echo,  Cardiac Doppler, Color Doppler and Intracardiac            Opacification Agent Indications:    Elevated Troponin  History:        Patient has prior history of Echocardiogram examinations, most                 recent 04/25/2019. Arrythmias:LBBB; Risk Factors:Hypertension and                 Sleep Apnea. Presenting to the emergency department for                 evaluation of tremors, fatigue, shortness of breath, and                 anxiety. Alcohol withdrawal, left foot wound.  Sonographer:    Darlina Sicilian RDCS Referring Phys: [id number] [place]  1. Left ventricular ejection fraction, by estimation, is 45 to 50%. The left ventricle has mildly decreased function. The left ventricle demonstrates regional wall motion abnormalities (see scoring diagram/findings for description). Left ventricular diastolic parameters are consistent with Grade III diastolic dysfunction (restrictive). There is severe hypokinesis of the left ventricular, mid lateral wall.  2. Right ventricular systolic function is normal. The right ventricular size is normal.  3. Left atrial size was mildly dilated.  4. Right atrial size was mildly dilated.  5. The mitral valve is normal in structure. Trivial mitral valve regurgitation.  6. The aortic valve was not well visualized. There is mild calcification of the aortic valve. Aortic valve regurgitation is not visualized.  7. Evidence of atrial level shunting detected by color flow Doppler. FINDINGS  Left Ventricle: Left ventricular ejection fraction, by estimation, is 45 to 50%. The left ventricle has mildly decreased function. The left ventricle demonstrates regional wall motion abnormalities. Severe hypokinesis of the left ventricular, mid lateral wall. Definity contrast agent was given IV to delineate the left ventricular endocardial borders. The left ventricular internal cavity size was normal in size. There is no left ventricular hypertrophy. Left ventricular diastolic parameters  are consistent with Grade III diastolic dysfunction (restrictive). Right Ventricle: The right ventricular size is normal. Right vetricular wall thickness was not well visualized. Right ventricular systolic function is normal. Left Atrium: Left atrial size was mildly dilated. Right Atrium: Right atrial size was mildly dilated. Pericardium: The pericardium was not well visualized. Mitral Valve: The mitral valve is normal in structure. Trivial mitral valve regurgitation. Tricuspid Valve: The tricuspid valve is not well visualized. Tricuspid valve regurgitation is not demonstrated. Aortic Valve:  The aortic valve was not well visualized. There is mild calcification of the aortic valve. Aortic valve regurgitation is not visualized. Pulmonic Valve: The pulmonic valve was not well visualized. Pulmonic valve regurgitation is not visualized. Aorta: The aortic root and ascending aorta are structurally normal, with no evidence of dilitation. IAS/Shunts: Evidence of atrial level shunting detected by color flow Doppler.  LEFT VENTRICLE PLAX 2D LVIDd:         5.40 cm      Diastology LVIDs:         3.70 cm      LV e' medial:    6.85 cm/s LV PW:         1.00 cm      LV E/e' medial:  14.9 LV IVS:        1.10 cm      LV e' lateral:   6.20 cm/s LVOT diam:     2.40 cm      LV E/e' lateral: 16.5 LV SV:         86 LV SV Index:   35 LVOT Area:     4.52 cm  LV Volumes (MOD) LV vol d, MOD A2C: 173.0 ml LV vol d, MOD A4C: 154.0 ml LV vol s, MOD A2C: 57.6 ml LV vol s, MOD A4C: 68.4 ml LV SV MOD A2C:     115.4 ml LV SV MOD A4C:     154.0 ml LV SV MOD BP:      102.0 ml RIGHT VENTRICLE RV S prime:     18.50 cm/s TAPSE (M-mode): 1.8 cm LEFT ATRIUM             Index        RIGHT ATRIUM           Index LA diam:        4.80 cm 1.98 cm/m   RA Area:     20.70 cm LA Vol (A2C):   83.2 ml 34.31 ml/m  RA Volume:   52.70 ml  21.73 ml/m LA Vol (A4C):   78.2 ml 32.25 ml/m LA Biplane Vol: 83.5 ml 34.43 ml/m  AORTIC VALVE LVOT Vmax:   90.70 cm/s LVOT  Vmean:  60.700 cm/s LVOT VTI:    0.189 m  AORTA Ao Root diam: 3.50 cm Ao Asc diam:  3.60 cm MITRAL VALVE MV Area (PHT): 4.31 cm     SHUNTS MV Decel Time: 176 msec     Systemic VTI:  0.19 m MV E velocity: 102.00 cm/s  Systemic Diam: 2.40 cm MV A velocity: 47.10 cm/s MV E/A ratio:  2.17 Mertie Moores MD Electronically signed by Mertie Moores MD Signature Date/Time: 11/14/2021/1:16:00 PM    Final      Assessment and Plan:   1.NSTEMI - presented with EtOH withdrawal, denies any significant chest pain. Does report some DOE over the last month.  - peak trop 1576 trending down, EKG without specific ischemic changes - Echo LVEF 45-50%, lateral wall hypokinesis  - medical therapy with ASA 81, atorva 80, lopressor 25m bid. Start losartan 12.546mdaily.   - thrombocytopenia this admit likely secondary to EtOH. Would like to see platelets plateau and [facility] to [facility]. Patient is pain free with resolving troponin.    2.EtoH withdrawal - per primary team   3.Thrombocytopenia - likely related to EtOH - admit 105(admit)-->94-->80   Risk Assessment/Risk Scores:   TIMI Risk Score for Unstable Angina or Non-ST Elevation MI:  The patient's TIMI risk score is 2, which indicates a 8% risk of all cause mortality, new or recurrent myocardial infarction or need for urgent revascularization in the next 14 days.{   For questions or updates, please contact [place] Please consult [email or web address] for contact info under    Signed, Carlyle Dolly, MD  11/15/2021 8:43 AM

## 2021-11-16 DIAGNOSIS — F10230 Alcohol dependence with withdrawal, uncomplicated: Secondary | ICD-10-CM

## 2021-11-16 DIAGNOSIS — I5021 Acute systolic (congestive) heart failure: Secondary | ICD-10-CM

## 2021-11-16 DIAGNOSIS — R778 Other specified abnormalities of plasma proteins: Secondary | ICD-10-CM | POA: Diagnosis not present

## 2021-11-16 DIAGNOSIS — D696 Thrombocytopenia, unspecified: Secondary | ICD-10-CM | POA: Diagnosis not present

## 2021-11-16 LAB — CBC
HCT: 34.7 % — ABNORMAL LOW (ref 39.0–52.0)
HCT: 37.2 % — ABNORMAL LOW (ref 39.0–52.0)
Hemoglobin: 11.6 g/dL — ABNORMAL LOW (ref 13.0–17.0)
Hemoglobin: 12.1 g/dL — ABNORMAL LOW (ref 13.0–17.0)
MCH: 31.8 pg (ref 26.0–34.0)
MCH: 31.3 pg (ref 26.0–34.0)
MCHC: 33.4 g/dL (ref 30.0–36.0)
MCHC: 32.5 g/dL (ref 30.0–36.0)
MCV: 95.1 fL (ref 80.0–100.0)
MCV: 96.4 fL (ref 80.0–100.0)
Platelets: 89 10*3/uL — ABNORMAL LOW (ref 150–400)
Platelets: 102 10*3/uL — ABNORMAL LOW (ref 150–400)
RBC: 3.65 MIL/uL — ABNORMAL LOW (ref 4.22–5.81)
RBC: 3.86 MIL/uL — ABNORMAL LOW (ref 4.22–5.81)
RDW: 17.8 % — ABNORMAL HIGH (ref 11.5–15.5)
RDW: 18.1 % — ABNORMAL HIGH (ref 11.5–15.5)
WBC: 2.9 10*3/uL — ABNORMAL LOW (ref 4.0–10.5)
WBC: 2.9 10*3/uL — ABNORMAL LOW (ref 4.0–10.5)
nRBC: 0 % (ref 0.0–0.2)
nRBC: 0 % (ref 0.0–0.2)

## 2021-11-16 LAB — COMPREHENSIVE METABOLIC PANEL
ALT: 50 U/L — ABNORMAL HIGH (ref 0–44)
AST: 85 U/L — ABNORMAL HIGH (ref 15–41)
Albumin: 3.2 g/dL — ABNORMAL LOW (ref 3.5–5.0)
Alkaline Phosphatase: 162 U/L — ABNORMAL HIGH (ref 38–126)
Anion gap: 13 (ref 5–15)
BUN: 14 mg/dL (ref 6–20)
CO2: 21 mmol/L — ABNORMAL LOW (ref 22–32)
Calcium: 8.5 mg/dL — ABNORMAL LOW (ref 8.9–10.3)
Chloride: 100 mmol/L (ref 98–111)
Creatinine, Ser: 0.82 mg/dL (ref 0.61–1.24)
GFR, Estimated: >60 mL/min (ref >60)
Glucose, Bld: 80 mg/dL (ref 70–99)
Potassium: 3.9 mmol/L (ref 3.5–5.1)
Sodium: 134 mmol/L — ABNORMAL LOW (ref 135–145)
Total Bilirubin: 1.9 mg/dL — ABNORMAL HIGH (ref 0.3–1.2)
Total Protein: 6.7 g/dL (ref 6.5–8.1)

## 2021-11-16 LAB — MAGNESIUM: Magnesium: 1.7 mg/dL (ref 1.7–2.4)

## 2021-11-16 LAB — TYPE AND SCREEN
ABO/RH(D): B POS
Antibody Screen: NEGATIVE

## 2021-11-16 LAB — HEPARIN LEVEL (UNFRACTIONATED)
Heparin Unfractionated: 0.23 IU/mL — ABNORMAL LOW (ref 0.30–0.70)
Heparin Unfractionated: 0.34 IU/mL (ref 0.30–0.70)

## 2021-11-16 LAB — OCCULT BLOOD X 1 CARD TO LAB, STOOL: Fecal Occult Bld: POSITIVE — AB

## 2021-11-16 MED ORDER — PANTOPRAZOLE SODIUM 40 MG PO TBEC
40.0000 mg | DELAYED_RELEASE_TABLET | Freq: Two times a day (BID) | ORAL | Status: DC
  Administered 2021-11-16 – 2021-11-19 (×6): 40 mg via ORAL
  Filled 2021-11-16 (×6): qty 1

## 2021-11-16 MED ORDER — LOPERAMIDE HCL 2 MG PO CAPS
2.0000 mg | ORAL_CAPSULE | Freq: Once | ORAL | Status: AC
  Administered 2021-11-17: 2 mg via ORAL
  Filled 2021-11-16: qty 1

## 2021-11-16 MED ORDER — HEPARIN BOLUS VIA INFUSION
1500.0000 [IU] | Freq: Once | INTRAVENOUS | Status: AC
  Administered 2021-11-16: 1500 [IU] via INTRAVENOUS
  Filled 2021-11-16: qty 1500

## 2021-11-16 NOTE — Hospital Course (Addendum)
[name] is a pleasant 61 y.o. male with medical history significant for severe alcohol dependence, hypertension, low back pain with radiculopathy, and chronic left foot wound with osteomyelitis, now presenting to the emergency department for evaluation of tremors, fatigue, shortness of breath, and anxiety.  1/27 admitted to the hospital.  Work-up shows elevated troponins.  Echocardiogram shows worsening EF. 1/28 cardiology consulted.  Patient started on IV heparin after consultation with cardiology. 1/30 patient noticed to have some penile bleeding, FOBT also positive.  Heparin stopped.  Bleeding stopped. 1/31 bleeding stopped.  Cardiology signed off.

## 2021-11-16 NOTE — Progress Notes (Signed)
ANTICOAGULATION CONSULT NOTE - follow up  Pharmacy Consult for heparin Indication: chest pain/ACS  Allergies  Allergen Reactions   Claritin [Loratadine] Shortness Of Breath and Anxiety    Patient Measurements: Height: 6\' 1"  (185.4 cm) Weight: 133 kg (293 lb 3.4 oz) IBW/kg (Calculated) : 79.9 Heparin Dosing Weight: 106 kg  Vital Signs: Temp: 99.1 F (37.3 C) (01/29 1934) Temp Source: Oral (01/29 1934) BP: 140/83 (01/29 1934) Pulse Rate: 101 (01/29 1934)  Labs: Recent Labs     0000 11/13/21 1942 11/13/21 1954 11/13/21 2154 11/14/21 0200 11/14/21 0500 11/14/21 0846 11/14/21 2256 11/15/21 0731 11/15/21 1653 11/15/21 2259  HGB   < >  --  13.2  --   --  12.5*  --   --  12.2*  --   --   HCT  --   --  38.0*  --   --  36.9*  --   --  36.5*  --   --   PLT  --   --  105*  --   --  94*  --   --  80*  --   --   HEPARINUNFRC  --   --   --   --   --   --   --    < > 0.28* 0.34 0.23*  CREATININE  --  0.85  --   --   --  0.87  --   --  0.81  --   --   CKTOTAL  --   --   --   --   --   --  654*  --   --   --   --   TROPONINIHS  --   --  926*   < > 1,576* 1,107* 634*  --   --   --   --    < > = values in this interval not displayed.     Estimated Creatinine Clearance: 138.7 mL/min (by C-G formula based on SCr of 0.81 mg/dL).   Medical History: Past Medical History:  Diagnosis Date   Alcohol dependence (HCC)    Arthritis    hips, hands   Bilateral carpal tunnel syndrome    Bilateral leg edema    Chronic   CHF (congestive heart failure) (HCC)    Diabetes mellitus without complication (HCC)    type 2   Diverticulitis    portion of colon removed   DOE (dyspnea on exertion)    occ   Elevated liver enzymes    Fatty liver    GERD (gastroesophageal reflux disease)    occ   Hammer toe    Hip pain    History of ventral hernia repair 2016   x2   Hyperlipidemia    pt unsure   Hypertension    Marijuana abuse    Morbid obesity (HCC)    Neuromuscular disorder (HCC)     peripheral neuropathy feet and few fingers   OSA (obstructive sleep apnea)    has OSA-not used CPAP 2-3 yrs could not tolerate cpap   PONV (postoperative nausea and vomiting)    Toe ulcer (HCC)    left healed    Medications: Patient is not prescribed anticoagulants PTA  Assessment: Patient is a 69 yoM presenting with tremors, fatigue, SOB. PMH significant for EtOH dependence. Thrombocytopenia appears chronic, likely related to alcoholism per H&P note. Pharmacy consulted to dose heparin for ACS.   HL 0.23 subtherapeutic on 1800 units/hr Per RN no interruptions or bleeding  Goal of Therapy:  Heparin level 0.3-0.7 units/ml Monitor platelets by anticoagulation protocol: Yes   Plan:  Heparin bolus of 1500 units once Increase heparin infusion to 2000 units/hr Check 6 hour HL HL, CBC daily while on heparin infusion Monitor for signs of bleeding  Arley Phenix RPh 11/16/2021, 1:07 AM

## 2021-11-16 NOTE — Assessment & Plan Note (Addendum)
1/30 patient started to have bleeding. Unclear of the source but per RN patient may have bled from his urethra. His FOBT was positive but his stool was brown. Cardiology recommended to discontinue heparin. Hemoglobin stable.  No further bleeding.  DVT prophylaxis resumed.  Continue aspirin.

## 2021-11-16 NOTE — Discharge Instructions (Signed)
[facility] (ADS) 4.6 (9)  Addiction treatment center [address] Open ? Closes 2:30?PM  [phone] Medicare/Medicaid accepted   [facility] 4.0 (1)  Alcoholism treatment program Open ? Closes 12?AM  [phone] [facility] No reviews  Counselor [address] Open ? Closes 8?PM  [phone]  [facility] 3.7 (68)  Addiction treatment center [address] Open ? Closes 9?PM  [phone] Medicare/Medicaid accepted   [facility] 3.4 (16)  Alcoholism treatment program [address]  In [employer] [facility] [place] Open ? Closes 6?PM  [phone]   [facility] 4.4 (7)  Addiction treatment center [place], [place] Open ? Closes 10?PM  [phone]  [facility] 4.2 (52)  Addiction treatment center [address] Open 24 hours  [phone]   [facility] 3.0 (4)  Addiction treatment center [address] [phone] [place] 3.8 (139)  Addiction treatment center [address] [facility] Closed ? Opens 5?AM Tue  [phone] Medicare/Medicaid accepted  [facility] 5.0 (2)  Counselor [address] Open ? Closes 3?PM  [phone] Medicaid accepted  alcoholics anonymous in [place], [place] 5.0 (6)  Alcoholism treatment program [address] [place] [phone]  [facility] [place] No reviews  Non-profit organization [address] [place]  [employer] house [phone] [address] Open now  [phone]  [facility] 3.7 (26)  Addiction treatment center [address] Open ? Closes 5?PM  [phone] Medicare/Medicaid accepted  [facility] 5.0 (6)  Addiction treatment center [address] Open ? Closes 5?PM  [phone] Medicare/Medicaid accepted  [facility]   4.5 (21)  Addiction treatment center [address] [facility] Open ? Closes 2?PM  [phone]  [facility]   Addiction treatment center [place], [place] [phone] [facility] 5.0 (5)  Counselor [address] [phone] Open ? Closes 5?PM  [phone]   [facility]    Alcoholism treatment program [place], [place] [phone]
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
## 2021-11-16 NOTE — TOC Transition Note (Signed)
Transition of Care [facility]) - CM/SW Discharge Note   Patient Details  Name: Adnrew Honkala MRN: [id number] Date of Birth: 02/03/1961  Transition of [facility]) CM/SW Contact:  Dessa Phi, RN Phone Number: 11/16/2021, 12:40 PM   Clinical Narrative: Referral for SA resources-provided patient w/ETOH resouce list on d/c instructions. No further CM needs.      Final next level of care: Home/Self Care Barriers to Discharge: Continued Medical Work up   Patient Goals and CMS Choice Patient states their goals for this hospitalization and ongoing recovery are:: home CMS Medicare.gov Compare Post Acute Care list provided to:: Patient Choice offered to / list presented to : Patient  Discharge Placement                       Discharge Plan and Services   Discharge Planning Services: CM Consult                                 Social Determinants of Health (SDOH) Interventions     Readmission Risk Interventions Readmission Risk Prevention Plan 03/04/2020  Transportation Screening Complete  PCP or Specialist Appt within 3-5 Days Complete  HRI or [place] Complete  Social Work Consult for [place] Planning/Counseling Complete  Palliative Care Screening Complete  Medication Review [employer]) Complete  Some recent data might be hidden
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
## 2021-11-16 NOTE — Progress Notes (Addendum)
ANTICOAGULATION CONSULT NOTE - follow up  Pharmacy Consult for heparin Indication: chest pain/ACS  Allergies  Allergen Reactions   Claritin [Loratadine] Shortness Of Breath and Anxiety    Patient Measurements: Height: 6\' 1"  (185.4 cm) Weight: 133.2 kg (293 lb 10.4 oz) IBW/kg (Calculated) : 79.9 Heparin Dosing Weight: 106 kg  Vital Signs: Temp: 97.8 F (36.6 C) (01/30 0406) Temp Source: Oral (01/30 0406) BP: 144/79 (01/30 0406) Pulse Rate: 79 (01/30 0406)  Labs: Recent Labs    11/14/21 0200 11/14/21 0500 11/14/21 0846 11/14/21 2256 11/15/21 0731 11/15/21 1653 11/15/21 2259 11/16/21 0449 11/16/21 0749  HGB  --  12.5*  --   --  12.2*  --   --  11.6*  --   HCT  --  36.9*  --   --  36.5*  --   --  34.7*  --   PLT  --  94*  --   --  80*  --   --  89*  --   HEPARINUNFRC  --   --   --    < > 0.28* 0.34 0.23*  --  0.34  CREATININE  --  0.87  --   --  0.81  --   --  0.82  --   CKTOTAL  --   --  654*  --   --   --   --   --   --   TROPONINIHS 1,576* 1,107* 634*  --   --   --   --   --   --    < > = values in this interval not displayed.     Estimated Creatinine Clearance: 137.1 mL/min (by C-G formula based on SCr of 0.82 mg/dL).   Medical History: Past Medical History:  Diagnosis Date   Alcohol dependence (HCC)    Arthritis    hips, hands   Bilateral carpal tunnel syndrome    Bilateral leg edema    Chronic   CHF (congestive heart failure) (HCC)    Diabetes mellitus without complication (HCC)    type 2   Diverticulitis    portion of colon removed   DOE (dyspnea on exertion)    occ   Elevated liver enzymes    Fatty liver    GERD (gastroesophageal reflux disease)    occ   Hammer toe    Hip pain    History of ventral hernia repair 2016   x2   Hyperlipidemia    pt unsure   Hypertension    Marijuana abuse    Morbid obesity (HCC)    Neuromuscular disorder (HCC)    peripheral neuropathy feet and few fingers   OSA (obstructive sleep apnea)    has OSA-not  used CPAP 2-3 yrs could not tolerate cpap   PONV (postoperative nausea and vomiting)    Toe ulcer (HCC)    left healed    Medications: Patient is not prescribed anticoagulants PTA  Assessment: Patient is a 89 yoM presenting with tremors, fatigue, SOB. PMH significant for EtOH dependence. Thrombocytopenia appears chronic, likely related to alcoholism per H&P note. Pharmacy consulted to dose heparin for ACS.   Today, 11/16/21 - HL 0.34 - therapeutic on heparin 2000 units/hr - Per RN no interruptions, does note a sore on his penis that has been bleeding some, but no further bleeding. RN will monitor - Platelets low, but stable at 89.  Hgb slight dec to 11.6 from 12.2 - Cardiology following and would like to see platelets plateau and  further stabilize before committing to cath.   Goal of Therapy:  Heparin level 0.3-0.7 units/ml Monitor platelets by anticoagulation protocol: Yes   Plan:  Continue heparin drip at 2000 units/hr Check 6 hour HL HL, CBC daily while on heparin infusion Monitor for signs of bleeding F/u cardiology plans for cath and heparin LOT  Rexford Maus, PharmD 11/16/2021 8:46 AM   PM UPDATE: - Cardiology noted best option at this time is medical management.  - Also noted possible blood in stool this PM - Heparin drip discontinued  Rexford Maus, PharmD 11/16/2021 1:17 PM
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
## 2021-11-16 NOTE — Progress Notes (Signed)
At around 1130 tucker, [facility], made aware that patient had bleeding from penis scrotum area. And patient would be monitored closely for bleeding. At around 1300 assisted patient to bedside commode and noted bright red blood on clean bedding. Dr. Darryl Nestle made aware of the bleeding and was told this RN is still investigating the source. Pt had a BM and asked MD to place occult stool order to r/o GI bleed. Stool collected and sent to lab. Once patient back in bed, penis was further assessed and blood was noted in the urethra. Heparin gtt stopped, MD Dc'd heparin ordered. Will inform Pharmacy. Will continue to monitor for bleeding.

## 2021-11-17 DIAGNOSIS — I5021 Acute systolic (congestive) heart failure: Secondary | ICD-10-CM | POA: Diagnosis not present

## 2021-11-17 DIAGNOSIS — F10230 Alcohol dependence with withdrawal, uncomplicated: Secondary | ICD-10-CM | POA: Diagnosis not present

## 2021-11-17 DIAGNOSIS — R778 Other specified abnormalities of plasma proteins: Secondary | ICD-10-CM | POA: Diagnosis not present

## 2021-11-17 LAB — CBC WITH DIFFERENTIAL/PLATELET
Abs Immature Granulocytes: 0.01 10*3/uL (ref 0.00–0.07)
Basophils Absolute: 0 10*3/uL (ref 0.0–0.1)
Basophils Relative: 0 %
Eosinophils Absolute: 0.1 10*3/uL (ref 0.0–0.5)
Eosinophils Relative: 2 %
HCT: 35.9 % — ABNORMAL LOW (ref 39.0–52.0)
Hemoglobin: 11.8 g/dL — ABNORMAL LOW (ref 13.0–17.0)
Immature Granulocytes: 0 %
Lymphocytes Relative: 28 %
Lymphs Abs: 0.8 10*3/uL (ref 0.7–4.0)
MCH: 32 pg (ref 26.0–34.0)
MCHC: 32.9 g/dL (ref 30.0–36.0)
MCV: 97.3 fL (ref 80.0–100.0)
Monocytes Absolute: 0.4 10*3/uL (ref 0.1–1.0)
Monocytes Relative: 15 %
Neutro Abs: 1.5 10*3/uL — ABNORMAL LOW (ref 1.7–7.7)
Neutrophils Relative %: 55 %
Platelets: 101 10*3/uL — ABNORMAL LOW (ref 150–400)
RBC: 3.69 MIL/uL — ABNORMAL LOW (ref 4.22–5.81)
RDW: 18.3 % — ABNORMAL HIGH (ref 11.5–15.5)
WBC: 2.7 10*3/uL — ABNORMAL LOW (ref 4.0–10.5)
nRBC: 0 % (ref 0.0–0.2)

## 2021-11-17 LAB — COMPREHENSIVE METABOLIC PANEL
ALT: 52 U/L — ABNORMAL HIGH (ref 0–44)
AST: 80 U/L — ABNORMAL HIGH (ref 15–41)
Albumin: 3.3 g/dL — ABNORMAL LOW (ref 3.5–5.0)
Alkaline Phosphatase: 185 U/L — ABNORMAL HIGH (ref 38–126)
Anion gap: 10 (ref 5–15)
BUN: 10 mg/dL (ref 6–20)
CO2: 21 mmol/L — ABNORMAL LOW (ref 22–32)
Calcium: 9.1 mg/dL (ref 8.9–10.3)
Chloride: 102 mmol/L (ref 98–111)
Creatinine, Ser: 0.76 mg/dL (ref 0.61–1.24)
GFR, Estimated: >60 mL/min (ref >60)
Glucose, Bld: 84 mg/dL (ref 70–99)
Potassium: 4.5 mmol/L (ref 3.5–5.1)
Sodium: 133 mmol/L — ABNORMAL LOW (ref 135–145)
Total Bilirubin: 1.8 mg/dL — ABNORMAL HIGH (ref 0.3–1.2)
Total Protein: 7.1 g/dL (ref 6.5–8.1)

## 2021-11-17 LAB — MAGNESIUM: Magnesium: 1.7 mg/dL (ref 1.7–2.4)

## 2021-11-17 MED ORDER — ENOXAPARIN SODIUM 60 MG/0.6ML IJ SOSY
60.0000 mg | PREFILLED_SYRINGE | INTRAMUSCULAR | Status: DC
  Administered 2021-11-17 – 2021-11-18 (×2): 60 mg via SUBCUTANEOUS
  Filled 2021-11-17 (×2): qty 0.6

## 2021-11-17 MED ORDER — ASPIRIN EC 81 MG PO TBEC
81.0000 mg | DELAYED_RELEASE_TABLET | Freq: Every day | ORAL | Status: DC
  Administered 2021-11-17 – 2021-11-19 (×3): 81 mg via ORAL
  Filled 2021-11-17 (×3): qty 1

## 2021-11-17 MED ORDER — SACCHAROMYCES BOULARDII 250 MG PO CAPS
250.0000 mg | ORAL_CAPSULE | Freq: Two times a day (BID) | ORAL | Status: DC
  Administered 2021-11-17 – 2021-11-19 (×5): 250 mg via ORAL
  Filled 2021-11-17 (×5): qty 1

## 2021-11-17 MED ORDER — METOPROLOL SUCCINATE ER 25 MG PO TB24
25.0000 mg | ORAL_TABLET | Freq: Every day | ORAL | Status: DC
  Administered 2021-11-18 – 2021-11-19 (×2): 25 mg via ORAL
  Filled 2021-11-17 (×2): qty 1

## 2021-11-17 MED ORDER — LOPERAMIDE HCL 2 MG PO CAPS: 2.0000 mg | ORAL_CAPSULE | ORAL | Status: DC | PRN

## 2021-11-17 MED ORDER — LOSARTAN POTASSIUM 25 MG PO TABS
25.0000 mg | ORAL_TABLET | Freq: Every day | ORAL | Status: DC
  Administered 2021-11-18 – 2021-11-19 (×2): 25 mg via ORAL
  Filled 2021-11-17 (×2): qty 1

## 2021-11-17 MED ORDER — FUROSEMIDE 10 MG/ML IJ SOLN
40.0000 mg | Freq: Two times a day (BID) | INTRAMUSCULAR | Status: DC
  Administered 2021-11-17 – 2021-11-18 (×4): 40 mg via INTRAVENOUS
  Filled 2021-11-17 (×4): qty 4
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
## 2021-11-18 ENCOUNTER — Ambulatory Visit: Payer: Medicare Other | Admitting: Infectious Disease

## 2021-11-18 ENCOUNTER — Inpatient Hospital Stay (HOSPITAL_COMMUNITY): Payer: Medicare Other

## 2021-11-18 DIAGNOSIS — R7401 Elevation of levels of liver transaminase levels: Secondary | ICD-10-CM

## 2021-11-18 DIAGNOSIS — G4733 Obstructive sleep apnea (adult) (pediatric): Secondary | ICD-10-CM

## 2021-11-18 DIAGNOSIS — I5032 Chronic diastolic (congestive) heart failure: Secondary | ICD-10-CM

## 2021-11-18 DIAGNOSIS — E782 Mixed hyperlipidemia: Secondary | ICD-10-CM

## 2021-11-18 DIAGNOSIS — E662 Morbid (severe) obesity with alveolar hypoventilation: Secondary | ICD-10-CM

## 2021-11-18 LAB — CBC WITH DIFFERENTIAL/PLATELET
Abs Immature Granulocytes: 0.02 10*3/uL (ref 0.00–0.07)
Basophils Absolute: 0 10*3/uL (ref 0.0–0.1)
Basophils Relative: 1 %
Eosinophils Absolute: 0.1 10*3/uL (ref 0.0–0.5)
Eosinophils Relative: 1 %
HCT: 43 % (ref 39.0–52.0)
Hemoglobin: 14.4 g/dL (ref 13.0–17.0)
Immature Granulocytes: 1 %
Lymphocytes Relative: 20 %
Lymphs Abs: 0.9 10*3/uL (ref 0.7–4.0)
MCH: 31 pg (ref 26.0–34.0)
MCHC: 33.5 g/dL (ref 30.0–36.0)
MCV: 92.7 fL (ref 80.0–100.0)
Monocytes Absolute: 0.7 10*3/uL (ref 0.1–1.0)
Monocytes Relative: 16 %
Neutro Abs: 2.7 10*3/uL (ref 1.7–7.7)
Neutrophils Relative %: 61 %
Platelets: 154 10*3/uL (ref 150–400)
RBC: 4.64 MIL/uL (ref 4.22–5.81)
RDW: 18.1 % — ABNORMAL HIGH (ref 11.5–15.5)
WBC: 4.4 10*3/uL (ref 4.0–10.5)
nRBC: 0 % (ref 0.0–0.2)

## 2021-11-18 LAB — COMPREHENSIVE METABOLIC PANEL
ALT: 73 U/L — ABNORMAL HIGH (ref 0–44)
AST: 107 U/L — ABNORMAL HIGH (ref 15–41)
Albumin: 4 g/dL (ref 3.5–5.0)
Alkaline Phosphatase: 214 U/L — ABNORMAL HIGH (ref 38–126)
Anion gap: 12 (ref 5–15)
BUN: 19 mg/dL (ref 6–20)
CO2: 21 mmol/L — ABNORMAL LOW (ref 22–32)
Calcium: 9.7 mg/dL (ref 8.9–10.3)
Chloride: 99 mmol/L (ref 98–111)
Creatinine, Ser: 1.14 mg/dL (ref 0.61–1.24)
GFR, Estimated: >60 mL/min (ref >60)
Glucose, Bld: 144 mg/dL — ABNORMAL HIGH (ref 70–99)
Potassium: 4.4 mmol/L (ref 3.5–5.1)
Sodium: 132 mmol/L — ABNORMAL LOW (ref 135–145)
Total Bilirubin: 1.9 mg/dL — ABNORMAL HIGH (ref 0.3–1.2)
Total Protein: 9 g/dL — ABNORMAL HIGH (ref 6.5–8.1)

## 2021-11-18 LAB — MAGNESIUM: Magnesium: 1.7 mg/dL (ref 1.7–2.4)

## 2021-11-18 LAB — HEPATITIS PANEL, ACUTE
HCV Ab: NONREACTIVE
Hep A IgM: NONREACTIVE
Hep B C IgM: NONREACTIVE
Hepatitis B Surface Ag: NONREACTIVE

## 2021-11-18 MED ORDER — MAGNESIUM SULFATE 2 GM/50ML IV SOLN
2.0000 g | Freq: Once | INTRAVENOUS | Status: AC
  Administered 2021-11-18: 2 g via INTRAVENOUS
  Filled 2021-11-18: qty 50

## 2021-11-18 MED ORDER — POTASSIUM CHLORIDE CRYS ER 20 MEQ PO TBCR
40.0000 meq | EXTENDED_RELEASE_TABLET | Freq: Two times a day (BID) | ORAL | Status: DC
  Administered 2021-11-18 – 2021-11-19 (×2): 40 meq via ORAL
  Filled 2021-11-18 (×2): qty 2
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
## 2021-11-18 NOTE — Evaluation (Signed)
Occupational Therapy Evaluation Patient Details Name: [name] MRN: [id number] DOB: 01/30/1961 Today's Date: 11/18/2021   History of Present Illness [name] is a 61 y.o. male presents with generalized weakness. Pt admitted with acute CHF. PMH: etoh dependence, CHF, diabetes, HTN, morbid obesity, tobacco abuse, chronic osteomyelitis, bil TKA, bil THA, L 3rd toe amputation   Clinical Impression   [name] is a 61 year old man admitted to hospital with above medical history. On evaluation he appears to be near his baseline - demonstrating the ability to transfer to side of the bed, don his post up shoes, ambulate in room with walker, stand at sink and performing grooming and LB bathing. Patient reports feeling near his normal in regards to ADLs. Reports having needed DME and home setup and assistance of girlfriend most of the time. No OT needs at this time.      Recommendations for follow up therapy are one component of a multi-disciplinary discharge planning process, led by the attending physician.  Recommendations may be updated based on patient status, additional functional criteria and insurance authorization.   Follow Up Recommendations  No OT follow up    Assistance Recommended at Discharge PRN  Patient can return home with the following Assistance with cooking/housework    Functional Status Assessment  Patient has not had a recent decline in their functional status  Equipment Recommendations  None recommended by OT    Recommendations for Other Services       Precautions / Restrictions Precautions Precautions: Fall Restrictions Weight Bearing Restrictions: Yes LLE Weight Bearing: Non weight bearing (per PT but doesn't abide)      Mobility Bed Mobility Overal bed mobility: Modified Independent                  Transfers Overall transfer level: Modified independent Equipment used: Rolling walker (2 wheels) Transfers: Sit to/from Stand Sit to  Stand: Supervision           General transfer comment: supv to power to stand for safety, pt reports LLE NWB but has never abided by it, reports putting weight through heel and keeps it off of his wound, unable to perform STS with LLE NWB      Balance                                           ADL either performed or assessed with clinical judgement   ADL Overall ADL's : At baseline                                             Vision Patient Visual Report: No change from baseline       Perception     Praxis      Pertinent Vitals/Pain Pain Assessment Pain Assessment: No/denies pain     Hand Dominance Right   Extremity/Trunk Assessment Upper Extremity Assessment Upper Extremity Assessment: Overall WFL for tasks assessed   Lower Extremity Assessment Lower Extremity Assessment: Defer to PT evaluation LLE Deficits / Details: L foot dressing donned   Cervical / Trunk Assessment Cervical / Trunk Assessment: Normal   Communication Communication Communication: No difficulties   Cognition Arousal/Alertness: Awake/alert Behavior During Therapy: WFL for tasks assessed/performed Overall Cognitive Status: Within Functional Limits for tasks assessed  General Comments       Exercises     Shoulder Instructions      Home Living Family/patient expects to be discharged to:: Private residence Living Arrangements: Spouse/significant other Available Help at Discharge: Family;Available PRN/intermittently Type of Home: House Home Access: Stairs to enter [employer] of Steps: 2 Entrance Stairs-Rails: Right;Left Home Layout: Two level;Able to live on main level with bedroom/bathroom Alternate Level Stairs-Number of Steps: 14 Alternate Level Stairs-Rails: Right Bathroom Shower/Tub: Producer, television/film/video: Standard Bathroom Accessibility: Yes   Home Equipment:  Agricultural consultant (2 wheels);Rollator (4 wheels);BSC/3in1;Shower seat;Wheelchair - manual;Toilet riser;Crutches   Additional Comments: lives with girlfriend who goes out of town at times, reports other friends/neighbors able to assist as needed      Prior Functioning/Environment Prior Level of Function : Independent/Modified Independent             Mobility Comments: uses walker/rollator/wheelchair as needed. is supposed to be NWB on LLE; was ambulating short household distances with RW PTA ADLs Comments: from seated position or propped on sink, dresses in bed or sitting at edge, sink baths        OT Problem List: Impaired balance (sitting and/or standing)      OT Treatment/Interventions:      OT Goals(Current goals can be found in the care plan section) Acute Rehab OT Goals OT Goal Formulation: All assessment and education complete, DC therapy  OT Frequency:      Co-evaluation              AM-PAC OT "6 Clicks" Daily Activity     Outcome Measure Help from another person eating meals?: None Help from another person taking care of personal grooming?: None Help from another person toileting, which includes using toliet, bedpan, or urinal?: None Help from another person bathing (including washing, rinsing, drying)?: None Help from another person to put on and taking off regular upper body clothing?: None Help from another person to put on and taking off regular lower body clothing?: None 6 Click Score: 24   End of Session Equipment Utilized During Treatment: Rolling walker (2 wheels) Nurse Communication: Mobility status  Activity Tolerance: Patient tolerated treatment well Patient left: in chair;with call bell/phone within reach  OT Visit Diagnosis: Unsteadiness on feet (R26.81)                Time: 5053-9767 OT Time Calculation (min): 21 min Charges:  OT General Charges $OT Visit: 1 Visit OT Evaluation $OT Eval Low Complexity: 1 Low  Dodi Leu, OTR/L Acute Care  Rehab Services  Office [phone] Pager: [phone]   Kelli Churn 11/18/2021, 1:05 PM

## 2021-11-18 NOTE — Evaluation (Addendum)
Physical Therapy Evaluation Patient Details Name: Cardarius Senat MRN: [id number] DOB: 07-18-1961 Today's Date: 11/18/2021  History of Present Illness  Bertrum Helmstetter is a 61 y.o. male presents with generalized weakness. Pt admitted with acute CHF. PMH: etoh dependence, CHF, diabetes, HTN, morbid obesity, tobacco abuse, chronic osteomyelitis, bil TKA, bil THA, L 3rd toe amputation   Clinical Impression  Pt admitted with above diagnosis. Pt from home with girlfriend, reports has been NWB LLE due to wound healing for ~1 year but has never been able to respect it and doctor is aware, using RW/rollator/wc recently to mobilize around the home. Pt currently powering to stand with supv and ambulates around room using RW with supv, unable to complete with LLE NWB. Educated on LLE NWB and demonstrated gait pattern, but unable despite attempts. Pt agreeable to HHPT recommendation, reports will have girlfriend or friends/neighbors to assist as needed. Pt currently with functional limitations due to the deficits listed below (see PT Problem List). Pt will benefit from skilled PT to increase their independence and safety with mobility to allow discharge to the venue listed below.          Recommendations for follow up therapy are one component of a multi-disciplinary discharge planning process, led by the attending physician.  Recommendations may be updated based on patient status, additional functional criteria and insurance authorization.  Follow Up Recommendations Home health PT    Assistance Recommended at Discharge Intermittent Supervision/Assistance  Patient can return home with the following  Assistance with cooking/housework;Assist for transportation;Help with stairs or ramp for entrance    Equipment Recommendations None recommended by PT  Recommendations for Other Services       Functional Status Assessment Patient has had a recent decline in their functional status and demonstrates the ability to  make significant improvements in function in a reasonable and predictable amount of time.     Precautions / Restrictions Precautions Precautions: Fall Restrictions Weight Bearing Restrictions: Yes LLE Weight Bearing: Non weight bearing (per PT but doesn't abide)      Mobility  Bed Mobility Overal bed mobility: Modified Independent  General bed mobility comments: increased time and effort to come to sitting EOB    Transfers Overall transfer level: Needs assistance Equipment used: Rolling walker (2 wheels) Transfers: Sit to/from Stand Sit to Stand: Supervision  General transfer comment: supv to power to stand for safety, pt reports LLE NWB but has never abided by it, reports putting weight through heel and keeps it off of his wound, unable to perform STS with LLE NWB    Ambulation/Gait Ambulation/Gait assistance: Supervision Gait Distance (Feet): 30 Feet Assistive device: Rolling walker (2 wheels) Gait Pattern/deviations: Step-to pattern, Decreased weight shift to left Gait velocity: decreased  General Gait Details: step to pattern with decreased weight-shift L, does weightbear on LLE but reports putting weight into heel, unable to perform hop-to pattern with true NWB LLE  Stairs            Wheelchair Mobility    Modified Rankin (Stroke Patients Only)       Balance Overall balance assessment: Needs assistance   Sitting balance-Leahy Scale: Good  Standing balance support: During functional activity Standing balance-Leahy Scale: Fair Standing balance comment: static without UE support, dynamic using RW, unable to follow LLE NWB       Pertinent Vitals/Pain Pain Assessment Pain Assessment: Faces Faces Pain Scale: Hurts a little bit Pain Location: "sciatica" R side Pain Descriptors / Indicators: Grimacing, Guarding Pain Intervention(s): Limited activity within  patient's tolerance, Monitored during session, Repositioned    Home Living Family/patient expects  to be discharged to:: Private residence Living Arrangements: Spouse/significant other Available Help at Discharge: Family;Available PRN/intermittently Type of Home: House Home Access: Stairs to enter Entrance Stairs-Rails: Right;Left Entrance Stairs-Number of Steps: 2 Alternate Level Stairs-Number of Steps: 14 Home Layout: Two level;Able to live on main level with bedroom/bathroom Home Equipment: Rolling Walker (2 wheels);Rollator (4 wheels);BSC/3in1;Shower seat;Wheelchair - manual;Toilet riser;Crutches Additional Comments: lives with girlfriend who goes out of town at times, reports other friends/neighbors able to assist as needed    Prior Function Prior Level of Function : Independent/Modified Independent  Mobility Comments: uses walker/rollator/wheelchair as needed. is supposed to be NWB on LLE; was ambulating short household distances with RW PTA ADLs Comments: from seated position or propped on sink, dresses in bed or sitting at edge, sink baths     Hand Dominance   Dominant Hand: Right    Extremity/Trunk Assessment   Upper Extremity Assessment Upper Extremity Assessment: Overall WFL for tasks assessed    Lower Extremity Assessment Lower Extremity Assessment: Defer to PT evaluation LLE Deficits / Details: L foot dressing donned    Cervical / Trunk Assessment Cervical / Trunk Assessment: Normal  Communication   Communication: No difficulties  Cognition Arousal/Alertness: Awake/alert Behavior During Therapy: WFL for tasks assessed/performed Overall Cognitive Status: Within Functional Limits for tasks assessed     General Comments      Exercises General Exercises - Lower Extremity Quad Sets: Seated, AROM, Strengthening, Both, 5 reps Hip ABduction/ADduction: Seated, AROM, Strengthening, Both, 5 reps   Assessment/Plan    PT Assessment Patient needs continued PT services  PT Problem List Decreased activity tolerance;Decreased balance;Decreased knowledge of use of  DME;Decreased safety awareness;Obesity;Decreased skin integrity;Decreased knowledge of precautions;Impaired sensation       PT Treatment Interventions DME instruction;Gait training;Stair training;Functional mobility training;Therapeutic activities;Therapeutic exercise;Balance training;Patient/family education    PT Goals (Current goals can be found in the Care Plan section)  Acute Rehab PT Goals Patient Stated Goal: home with HHPT PT Goal Formulation: With patient Time For Goal Achievement: 12/02/21 Potential to Achieve Goals: Good    Frequency Min 3X/week     Co-evaluation               AM-PAC PT "6 Clicks" Mobility  Outcome Measure Help needed turning from your back to your side while in a flat bed without using bedrails?: None Help needed moving from lying on your back to sitting on the side of a flat bed without using bedrails?: None Help needed moving to and from a bed to a chair (including a wheelchair)?: A Little Help needed standing up from a chair using your arms (e.g., wheelchair or bedside chair)?: A Little Help needed to walk in hospital room?: A Little Help needed climbing 3-5 steps with a railing? : A Lot 6 Click Score: 19    End of Session   Activity Tolerance: Patient tolerated treatment well Patient left: in chair;with call bell/phone within reach;Other (comment) (MD in room) Nurse Communication: Mobility status;Weight bearing status PT Visit Diagnosis: Unsteadiness on feet (R26.81);Other abnormalities of gait and mobility (R26.89);Muscle weakness (generalized) (M62.81);Pain Pain - Right/Left: Right Pain - part of body: Hip    Time: 1137-1201 PT Time Calculation (min) (ACUTE ONLY): 24 min   Charges:   PT Evaluation $PT Eval Low Complexity: 1 Low PT Treatments $Gait Training: 8-22 mins         Tori Keidrick Murty PT, DPT 11/18/21, 1:03 PM

## 2021-11-18 NOTE — Plan of Care (Signed)

## 2021-11-18 NOTE — Care Management Important Message (Signed)
Important Message  Patient Details IM Letter given to the Patient. Name: Lautaro Koral MRN: [id number] Date of Birth: [birth year]   Medicare Important Message Given:  Yes     Caren Macadam 11/18/2021, 11:31 AM

## 2021-11-19 LAB — CBC
HCT: 39.2 % (ref 39.0–52.0)
Hemoglobin: 13.3 g/dL (ref 13.0–17.0)
MCH: 32 pg (ref 26.0–34.0)
MCHC: 33.9 g/dL (ref 30.0–36.0)
MCV: 94.5 fL (ref 80.0–100.0)
Platelets: 126 10*3/uL — ABNORMAL LOW (ref 150–400)
RBC: 4.15 MIL/uL — ABNORMAL LOW (ref 4.22–5.81)
RDW: 17.9 % — ABNORMAL HIGH (ref 11.5–15.5)
WBC: 3.4 10*3/uL — ABNORMAL LOW (ref 4.0–10.5)
nRBC: 0 % (ref 0.0–0.2)

## 2021-11-19 LAB — COMPREHENSIVE METABOLIC PANEL
ALT: 68 U/L — ABNORMAL HIGH (ref 0–44)
AST: 84 U/L — ABNORMAL HIGH (ref 15–41)
Albumin: 3.6 g/dL (ref 3.5–5.0)
Alkaline Phosphatase: 184 U/L — ABNORMAL HIGH (ref 38–126)
Anion gap: 9 (ref 5–15)
BUN: 27 mg/dL — ABNORMAL HIGH (ref 6–20)
CO2: 22 mmol/L (ref 22–32)
Calcium: 9.4 mg/dL (ref 8.9–10.3)
Chloride: 101 mmol/L (ref 98–111)
Creatinine, Ser: 1.16 mg/dL (ref 0.61–1.24)
GFR, Estimated: >60 mL/min (ref >60)
Glucose, Bld: 109 mg/dL — ABNORMAL HIGH (ref 70–99)
Potassium: 4.1 mmol/L (ref 3.5–5.1)
Sodium: 132 mmol/L — ABNORMAL LOW (ref 135–145)
Total Bilirubin: 1.6 mg/dL — ABNORMAL HIGH (ref 0.3–1.2)
Total Protein: 7.7 g/dL (ref 6.5–8.1)

## 2021-11-19 LAB — MAGNESIUM: Magnesium: 2.2 mg/dL (ref 1.7–2.4)

## 2021-11-19 MED ORDER — LOSARTAN POTASSIUM 25 MG PO TABS: 25.0000 mg | ORAL_TABLET | Freq: Every day | ORAL | 1 refills | Status: DC

## 2021-11-19 MED ORDER — ATORVASTATIN CALCIUM 80 MG PO TABS
80.0000 mg | ORAL_TABLET | Freq: Every day | ORAL | 1 refills | Status: DC
Start: 2021-11-26 — End: 2022-03-24

## 2021-11-19 MED ORDER — AMLODIPINE BESYLATE 5 MG PO TABS: 5.0000 mg | ORAL_TABLET | Freq: Every day | ORAL | 1 refills | Status: DC

## 2021-11-19 MED ORDER — PANTOPRAZOLE SODIUM 40 MG PO TBEC: 40.0000 mg | DELAYED_RELEASE_TABLET | Freq: Every day | ORAL | 1 refills | Status: DC

## 2021-11-19 MED ORDER — FUROSEMIDE 40 MG PO TABS: 40.0000 mg | ORAL_TABLET | Freq: Every day | ORAL | 1 refills | Status: DC | PRN

## 2021-11-19 MED ORDER — NITROGLYCERIN 0.4 MG SL SUBL: 0.4000 mg | SUBLINGUAL_TABLET | SUBLINGUAL | 1 refills | Status: DC | PRN

## 2021-11-19 MED ORDER — POTASSIUM CHLORIDE CRYS ER 20 MEQ PO TBCR: 60.0000 meq | EXTENDED_RELEASE_TABLET | Freq: Every day | ORAL | 1 refills | Status: DC | PRN

## 2021-11-19 MED ORDER — ASPIRIN 81 MG PO TBEC: 81.0000 mg | DELAYED_RELEASE_TABLET | Freq: Every day | ORAL | 11 refills | Status: DC

## 2021-11-19 MED ORDER — METOPROLOL SUCCINATE ER 25 MG PO TB24: 25.0000 mg | ORAL_TABLET | Freq: Every day | ORAL | 1 refills | Status: DC

## 2021-11-19 MED ORDER — ACETAMINOPHEN 325 MG PO TABS: 650.0000 mg | ORAL_TABLET | ORAL | Status: DC | PRN

## 2021-11-19 MED ORDER — SACCHAROMYCES BOULARDII 250 MG PO CAPS: 250.0000 mg | ORAL_CAPSULE | Freq: Two times a day (BID) | ORAL | Status: DC

## 2021-11-19 NOTE — TOC Transition Note (Addendum)
Transition of Care [facility]) - CM/SW Discharge Note   Patient Details  Name: [name] MRN: [id number] Date of Birth: 09-05-1961  Transition of Care [place]) CM/SW Contact:  Dessa Phi, RN Phone Number: 11/19/2021, 11:31 AM   Clinical Narrative: HHPT recc-patient agrees to [facility] used in past-rep [place] aware of d/c & HHPT order. OT-no f/u. No further CM needs.  Wound care clinic appt set see d/c instructions. -11:45a-added HHRN-[facility] rep Jason aware.    Final next level of care: [place] Barriers to Discharge: No Barriers Identified   Patient Goals and CMS Choice Patient states their goals for this hospitalization and ongoing recovery are:: home CMS Medicare.gov Compare Post Acute Care list provided to:: Patient Choice offered to / list presented to : Patient  Discharge Placement                       Discharge Plan and Services   Discharge Planning Services: CM Consult                      HH Arranged: PT [facility] Agency: [place] ([facility]) Date [facility] Agency Contacted: 11/19/21 Time [place]: [id number] Representative spoke with at [place]: [place] ([place]) Interventions     Readmission Risk Interventions Readmission Risk Prevention Plan 11/16/2021 03/04/2020  Transportation Screening Complete Complete  PCP or Specialist Appt within 3-5 Days Complete Complete  HRI or [place] Complete Complete  Social Work Consult for [place] Planning/Counseling Complete Complete  Palliative Care Screening Complete Complete  Medication Review Press photographer) Complete Complete  Some recent data might be hidden
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
## 2021-11-23 ENCOUNTER — Telehealth: Payer: Self-pay

## 2021-11-23 ENCOUNTER — Other Ambulatory Visit: Payer: Self-pay | Admitting: Internal Medicine

## 2021-11-23 NOTE — Telephone Encounter (Signed)
Transition Care Management Follow-up Telephone Call Date of discharge and from where: TCM DC [facility] 11-19-21 Dx: acute CHF How have you been since you were released from the hospital? Doing good  Any questions or concerns? No  Items Reviewed: Did the pt receive and understand the discharge instructions provided? Yes  Medications obtained and verified? Yes  Other? No  Any new allergies since your discharge? Yes  Dietary orders reviewed? Yes Do you have support at home? Yes   Home Care and Equipment/Supplies: Were home health services ordered? Yes- PT/RN If so, what is the name of the agency? [facility]   Has the agency set up a time to come to the patient's home? yes Were any new equipment or medical supplies ordered?  No What is the name of the medical supply agency? [facility] Were you able to get the supplies/equipment? not applicable Do you have any questions related to the use of the equipment or supplies? No  Functional Questionnaire: (I = Independent and D = Dependent) ADLs: I  Bathing/Dressing- I  Meal Prep- I  Eating- I  Maintaining continence- I  Transferring/Ambulation- I  Managing Meds- I  Follow up appointments reviewed:  PCP Hospital f/u appt confirmed? Yes  [facility]erance [facility] Yes  Scheduled to see Dr Lucia Gaskins on 12-03-21 @ 130pm. Are transportation arrangements needed? No  If their condition worsens, is the pt aware to call PCP or go to the Emergency Dept.? Yes Was the patient provided with contact information for the PCP's office or ED? Yes Was to pt encouraged to call back with questions or concerns? Yes

## 2021-11-25 ENCOUNTER — Other Ambulatory Visit (HOSPITAL_COMMUNITY): Payer: Self-pay | Admitting: Physician Assistant

## 2021-11-25 ENCOUNTER — Other Ambulatory Visit: Payer: Self-pay | Admitting: Physician Assistant

## 2021-11-25 ENCOUNTER — Other Ambulatory Visit: Payer: Self-pay

## 2021-11-25 ENCOUNTER — Encounter (HOSPITAL_BASED_OUTPATIENT_CLINIC_OR_DEPARTMENT_OTHER): Payer: Medicare Other | Attending: Physician Assistant | Admitting: Physician Assistant

## 2021-11-25 DIAGNOSIS — M19072 Primary osteoarthritis, left ankle and foot: Secondary | ICD-10-CM | POA: Insufficient documentation

## 2021-11-25 DIAGNOSIS — M86372 Chronic multifocal osteomyelitis, left ankle and foot: Secondary | ICD-10-CM | POA: Insufficient documentation

## 2021-11-25 DIAGNOSIS — E11621 Type 2 diabetes mellitus with foot ulcer: Secondary | ICD-10-CM | POA: Insufficient documentation

## 2021-11-25 DIAGNOSIS — M14672 Charcot's joint, left ankle and foot: Secondary | ICD-10-CM | POA: Diagnosis not present

## 2021-11-25 DIAGNOSIS — L97528 Non-pressure chronic ulcer of other part of left foot with other specified severity: Secondary | ICD-10-CM | POA: Diagnosis not present

## 2021-11-25 DIAGNOSIS — L97529 Non-pressure chronic ulcer of other part of left foot with unspecified severity: Secondary | ICD-10-CM

## 2021-11-25 NOTE — Progress Notes (Addendum)
LASZLO, FORNWALT ([id number]) Visit Report for 11/25/2021 Chief Complaint Document Details Patient Name: Date of Service: [name], [name] 11/25/2021 10:00 A M Medical Record Number: [id number] Patient Account Number: [id number] Date of Birth/Sex: Treating RN: [birth year] (61 y.o. [name] Primary Care Provider: Billey Gosling Other Clinician: Referring Provider: Treating Provider/Extender: Adele Schilder in Treatment: 24 Information Obtained from: Patient Chief Complaint Bilateral foot diabetic ulcers Electronic Signature(s) Signed: 11/25/2021 10:03:57 AM By: Worthy Keeler PA-C Entered By: Worthy Keeler on 11/25/2021 10:03:57 -------------------------------------------------------------------------------- Debridement Details Patient Name: Date of Service: SAJE, KANEMOTO IG 11/25/2021 10:00 A M Medical Record Number: [id number] Patient Account Number: [id number] Date of Birth/Sex: Treating RN: [birth year] (61 y.o. [name] Primary Care Provider: Billey Gosling Other Clinician: Referring Provider: Treating Provider/Extender: Adele Schilder in Treatment: 24 Debridement Performed for Assessment: Wound #3 Left,Plantar Foot Performed By: Physician Worthy Keeler, PA Debridement Type: Debridement Severity of Tissue Pre Debridement: Fat layer exposed Level of Consciousness (Pre-procedure): Awake and Alert Pre-procedure Verification/Time Out Yes - 10:37 Taken: Start Time: 10:38 Pain Control: Lidocaine 4% T opical Solution T Area Debrided (L x W): otal 3.5 (cm) x 3.7 (cm) = 12.95 (cm) Tissue and other material debrided: Viable, Non-Viable, Callus, Slough, Subcutaneous, Skin: Dermis , Skin: Epidermis, Fibrin/Exudate, Slough Level: Skin/Subcutaneous Tissue Debridement Description: Excisional Instrument: Curette Bleeding: Moderate Hemostasis Achieved: Pressure End Time: 10:45 Procedural Pain: 0 Post Procedural Pain: 0 Response to Treatment:  Procedure was tolerated well Level of Consciousness (Post- Awake and Alert procedure): Post Debridement Measurements of Total Wound Length: (cm) 3.2 Width: (cm) 3.5 Depth: (cm) 1 Volume: (cm) 8.796 Character of Wound/Ulcer Post Debridement: Stable Severity of Tissue Post Debridement: Fat layer exposed Post Procedure Diagnosis Same as Pre-procedure Electronic Signature(s) Signed: 11/25/2021 5:11:08 PM By: Worthy Keeler PA-C Signed: 11/25/2021 5:26:44 PM By: Deon Pilling RN, BSN Entered By: Deon Pilling on 11/25/2021 10:45:24 -------------------------------------------------------------------------------- HPI Details Patient Name: Date of Service: [name] 11/25/2021 10:00 A M Medical Record Number: [id number] Patient Account Number: [id number] Date of Birth/Sex: Treating RN: April 29, 1961 (61 y.o. [name] Primary Care Provider: Billey Gosling Other Clinician: Referring Provider: Treating Provider/Extender: Adele Schilder in Treatment: 24 History of Present Illness HPI Description: 10/31/2019 upon evaluation today patient appears to be doing somewhat poorly in regard to his bilateral plantar feet. He has wounds that he tells me have been present since 2012 intermittently off and on. Most recently this has been open for at least the past 6 months to a year. He has been trying to treat this in different ways using Santyl along with various other dressings including Medihoney and even at one point Xeroform. Nothing really has seem to get this completely closed. He was recently in the hospital for cellulitis of his leg subsequently he did have x-rays as well as MRIs that showed negative for any signs of osteomyelitis in regard to the wounds on his feet. Fortunately there is no signs of systemic infection at this time. No fevers, chills, nausea, vomiting, or diarrhea. Patient has previously used Darco offloading shoes as far as frontal floaters as well as postop  surgical shoes. He has never been in a total contact cast that may be something we need to strongly consider here. Patient's most recent hemoglobin A1c 1 month ago was 5.3 seems to be very well controlled which is great. Subsequently he has seen vascular as well as podiatry. His ABIs are 1.07 on  the left and 1.14 on the right he seems to be doing well he does have chronic venous stasis. 11/07/2019 upon evaluation today patient appears to be doing well with regard to his wounds all things considered. I do not see any severe worsening he still has some callus buildup on the right more than the left he notes that he has been probably more active than he should as far as walking is concerned is just very hard to not be active. He knows he needs to be more careful in this regard however. He is willing to give the cast a try at this point although he notes that he is a little nervous about this just with regard to balance although he will be very careful and obviously if he has any trouble he knows to contact the office and let me know. 1/22; patient is in for his obligatory first total contact cast change. Our intake nurse reported a very large amount of drainage which is spelled out over to the surrounding skin. Has bilateral diabetic foot wounds. He has Charcot feet. We have been using silver alginate on his wounds. 11/14/2019 on evaluation today patient is actually seeming to make good improvement in regard to his bilateral plantar foot wounds. We have been using a cast on the left side and on the right side he has been using dressings he is changing up his own accord. With that being said he tells me that he is also not walking as much just due to how unsteady he feels. He takes it easy when he does have to walk and when he does not have to walk he is resting. This is probably help in his right foot as well has the left foot which is actually measuring better. In fact both are measuring better. Overall I  am very pleased with how things seem to be progressing. The patient does have some odor on the left foot this does have me concerned about the possibility of infection, and actually probably go ahead and put him on antibiotics today as well as utilizing a continuation of the cast on the left foot I think that will be fine we probably just need to bring him in sooner to change this not last a whole week. 1/29; we brought the patient back today for a total contact cast change on the left out of concern for excessive drainage. We are using drawtex over the wound as the primary dressing 11/21/2019 on evaluation today patient appears to be doing well with regard to his left plantar foot. In fact both foot ulcers actually seem to be doing pretty well. Nonetheless he is having a lot of drainage on the left at this time and again we did obtain a wound culture did show positive for Staph aureus that was reviewed by myself today as well. Nonetheless he is on Bactrim which was shown to be sensitive that should be helping in this regard. Fortunately there is no signs of infection systemically at this point. 2/5; back in clinic today for a total contact cast change apparently secondary to very significant drainage. Still using drawtex 11/28/2019 upon evaluation today patient appears to be doing well with regard to his wounds. The right foot is doing okay as measured about the same in my opinion. The left foot is actually showing signs of significant improvement is measuring smaller there is a lot of hyper granulation likely due to the continued drainage at this point. We did obtain approval for  a snap VAC I think that is good to be appropriate for him and will likely help this tremendously underneath the cast. He is definitely in agreement with proceeding with such. 2/12; patient came in today after his snap VAC lost suction. Brought in to see one of our nurses. The dressing was replaced and then we put the cast back  on and rehooked up the snap VAC. Apparently his wound looked very good per our intake nurse. 2/15; again we replaced the cast on Friday. By Saturday the snap VAC and light suction. He called this morning he comes in acutely. The wounds look fine however the VAC is not functioning. We replaced the cast using silver alginate as the primary dressing backed with Kerramax. The snap VAC was not replaced 12/05/2019 upon evaluation today patient appears to be doing better in regard to his left plantar foot ulcer. Fortunately there is no signs of active infection at this time. Unfortunately he is continuing to have issues with the right foot he is really not making any progress here things seem to be somewhat stagnant to be honest. The depth has increased but that is due to me having debrided the wound in the past based on what I am seeing. 12/12/2019 on evaluation today patient appears to be doing more poorly in regard to the left lower extremity. He has some erythema spreading up the side of his foot I am concerned about infection again at this point. Unfortunately he has been seeing improvement with a total contact cast but I do not think we should put that on today. On his right plantar foot he continues to have significant drainage this is actually measuring deeper I really do not feel like you are making any progress whatsoever. I have prescribed Granix for him unfortunately his insurance apparently was going to cost him a $500 co-pay. 12/19/2019 upon evaluation today patient actually appears to be doing better in regard to both wounds. With that being said he actually did get the reGranix which he had to pay $500 for. With that being said it does look like that he is actually made some improvement based on what I am seeing at this point with the reGranix. Obviously if he is going to continue this we are going to do something about trying to get him some help in covering the cost. 12/26/2019 on evaluation  today patient appears to be doing really much better even compared to last week. Overall the wound seems to be much better even compared to last week and last week was better than the week before. Since has been using the reGranix his symptoms have improved significantly. With that being said the issue right now is simply that this is a very expensive medication for him the first dose cost him $500. Upon inspection patient's wound bed actually is however dramatically improved compared to before he started this 2 weeks ago. 01/02/2020 upon evaluation today patient appears to actually be doing well. He still had a little bit of reGranix left that has been using in small amounts he just been applying it every other day instead of every day in order to make it go longer. Overall we are still seeing excellent improvement he is measuring smaller looking better healthier tissue and everything seems to be pointing to this headed in the right direction. Fortunately there is no evidence of infection either which is also excellent news. He does have his MRI coming up within the next week. 01/09/2020 upon evaluation  today patient appears to be doing a little worse today compared to previous week's evaluation. He is actually been out of the reGranix at this point. He has been trying to make the stretch out so he has been changing the dressings on a regular set schedule like he was previous. I think this has made a difference. Fortunately there is no signs of active infection at this time. No fevers, chills, nausea, vomiting, or diarrhea. 01/16/2020 upon evaluation today patient appears to be doing well with regard to his left plantar foot ulcer. The right plantar foot still shows some significant depth at this point. Fortunately there is no signs of active infection at this time. 01/30/2020 upon evaluation today patient appears to be doing about the same in regard to his right plantar foot ulcer there is still some  depth here and we had to wait till he actually switched over to his new insurance to get approval for the MRI under his new insurance plan. With that being said he now has switched as of April 1. Fortunately there is no signs of active infection at this time. Overall in regard to his left foot ulcer this seems to be doing much better and I am actually very pleased with how things are going. With that being said it is not quite as much progress as we were seeing with the reGranix but at the same time he has had trouble getting this apparently there is been some hindrance here. I Georgina Peer try to actually send this to [facility] that was recommended by the drug rep to me. 02/13/20 upon evaluation today patient appears to be doing better in regard to his left foot ulcer this is great news. Unfortunately the right foot ulcer is not really significantly better at this time. There is no signs of active infection systemically though he did have his MRI which showed unfortunately he does have infection noted including an abscess in the foot. There is also marrow changes noted which are consistent with osteomyelitis based on the radiology review and interpretation. Unfortunately considering that the wound is not really making the progress that we will he would like to be seen I think that this is an indication that he may need some further referral both infectious disease as well as potentially to podiatrist to see if there is anything that can be done to help with the situation that were dealing with here. The left foot again is doing great. READMISSION 06/04/2021 This is a 61 year old man who was in the clinic in 2021 followed by Jeri Cos for areas on the right and left foot. He developed a left foot infection and was referred to ID. He left the clinic in a nonhealed state and was followed for a period of [facility] Cannon Kettle. Apparently things really deteriorated in early July when he  was admitted to hospital from 04/27/2021 through 05/06/2021 with sepsis secondary to a left foot infection. His blood cultures were negative. An MRI suggested fifth metatarsal osteomyelitis a left ankle septic joint. He was treated with vancomycin and ertapenem which he is still taking and may just about be finishing. He was seen by orthopedics and the patient adamantly refused to BKA. As far as I can tell he did not have the ankle aspirated I am not exactly sure what the issue was here. Since he has been discharged she is at [facility] for [facility]. He was last seen by Dr. Drucilla Schmidt on 05/20/2021 he  noted osteo of the tibial talar bone cuboid and fifth metatarsal which is even more extensive than what was suggested by the MRI. He is apparently going for a consultation with orthopedic surgery in [place] sometime next week. I received a call about this man 2 weeks ago from Dr. Gwenlyn Found who follows him for the possibility of PAD. ABIs I think done in the office showed a ABI on the right of 1.05 at the PTA and 0.99 at the PTA on the left. He had a DVT rule out in the left leg that was negative for the DVT. Past medical history is extensive and includes diastolic heart failure, right first metatarsal head ulcer in 2021, excision of the right second ray by Dr. Cannon Kettle on 03/13/2020, hypertension, hypothyroidism. Left total hip replacement, right total knee replacement, carpal tunnel syndrome, obstructive sleep apnea alcohol abuse with cirrhosis although the patient denies current alcohol intake. The patient does not think he is a diabetic however looking through [place] link I see 2 HgbAic's of this year that were greater than equal to 6.5 which by definition makes him diabetic. Nevertheless he is not on any treatment and does not check his blood sugars. The patient is now back home out of the nursing home. Saw Dr. Drucilla Schmidt last week he was taken off vancomycin and ertapenem on August 22 and now is  on doxycycline on Augmentin. He also saw Dr. Buford Dresser who is his orthopedic surgeon in [place] he recommended a [employer]. He has been using Medihoney. 9/6I have been having trouble getting hyperbarics approved through our prior authorization process. Even though he had a limb threatening infection in the left foot and probably the left ankle there glitches in how some of the reports are worded also some of the consultants. In any case I am going to repeat his sedimentation rate and C-reactive protein. I am generally not in favor of doing things like this as they really do not alter the plan of care from my point of view however I am going to need to demonstrate that these remain high in order to get this through forhyperbaric treatment for chronic refractory osteomyelitis 9/13; following this man for a wound on his left plantar foot in the setting of type 2 diabetes and Charcot deformity. He has underlying chronic refractory osteomyelitis. Follow-up sedimentation rate and C-reactive protein were both elevated but the C-reactive protein was down to 1.4, sedimentation rate at 70. Sedimentation rate was only slightly down from previous at 85. His wound is measuring slightly smaller. 9/20; patient started hyperbaric oxygen therapy today and tolerated treatment well. This is for the underlying osteomyelitis. He remains on antibiotics but thinks he is getting close to finishing. The wound on the plantar aspect of his foot is the other issue we are following here. He is using Medihoney The patient has a Charcot foot in the setting of type 2 diabetes. He is going to need a total contact cast although his partner was away this week and we elected to delay this till next week 9/27; patient still tolerating hyperbaric oxygen well. Wound looked generally healthy not much depth under illumination still 100% covered in fibrinous debris. Raised callused edges around the wound he was prepared for a total contact  cast. We have been using Medihoney 9/30; patient is back for his first obligatory total contact cast change. We are using Hydrofera Blue. Noted by our intake nurse to have a lot of drainage or at least a moderate amount of  drainage. I am not sure I was previously aware of this 10/4; patient arrives today with a lot of drainage under the cast. When he had it changed last Friday there was also a similar amount of drainage. Her intake nurse says that they tried a wound VAC on him perhaps while I was on vacation in August under the cast but that did not work. In my experience that has not been unusual. We have been using Hydrofera Blue with all the secondary absorbers. The drainage today went right through all of our dressings. The patient is concerned about his foot being in a cast without much drainage. He is tolerating HBO well. There has been improvements in the wound in the mid part of his foot in the setting of a Charcot deformity 10/7; patient presents for cast change. He has no issues or complaints today. He denies signs of infection. 10/11; patient presents for cast change. At this time he would like to take a break from the cast. He would like to do daily dressing changes with Hydrofera Blue. He currently denies signs of infection. 10/14; his cast was taken off last week at his request. He arrives in the clinic with [facility]. He has been changing the dressing himself. He has way too much edema in the left foot and leg to consider a total contact cast. I do not really know the issue here. He does have chronic venous insufficiency His wife stopped me in the clinic earlier in the week to report he is drinking again and she is concerned. I am uncertain whether there are other issues 10/18; he arrived in clinic last week having bilateral lower extremity edema likely secondary to chronic venous insufficiency there was too much edema in the left leg to apply a total contact cast. I put him in  compression on the left leg to control the swelling. This week he cut the wrap on the foot for reasons that are unclear however today he arrives in clinic with a smaller left leg but massive edema in the left foot. He is supposed to be wearing a juxta lite stocking on the right leg but he is not wearing the contact layer. His attendance at hyperbaric oxygen has been dwindling, he did not dive yesterday and he did not dive today concerned about hyperbarics causing swelling. I looked back in his record his last echo was in 2020 essentially normal left and right ventricular function. Last BUN and creatinine were done in July this was normal. He is having a lot of drainage in the left plantar foot wound 10/25; again he comes in today having missed HBO yesterday. Macerated skin around the wound which really does not look very good at all. A lot of edema in the left foot but an improvement in edema on the left leg. We wrapped him last week because of the amount of swelling in the left leg. We could not apply a total contact cast. The patient states that he wants to be able to change his dressing himself. I might consider this if he had stockings to control the swelling. He said he be here for HBO tomorrow. I will check the degree of erythema in his forefoot which I have marked. He is on doxycycline and Augmentin which was renewed by Dr. Drucilla Schmidt but I cannot see a follow-up note, follow-up inflammatory markers etc. I 1 point he said he was going to see his orthopedic surgeon in [place] I am not sure if he  ever did this. I do not know why he has not followed up with infectious disease, he says he was not given an appointment but he is still on Augmentin doxycycline 11/1; he did not do the lab work I ordered last week. Still on Augmentin and doxycycline. Silver alginate and he is changing this daily using his own juxta lite stocking. The surface of the wound does not look too bad. No real epithelialization  however. The patient was seen today along with HBO 08/26/2021 upon evaluation today patient is being seen at his request by myself he wanted to transfer care to see me. With that being said he has been seeing Dr. Leanord Hawking since he came back in August of this year. Unfortunately he tells me he still having quite a bit of drainage. He is also significant erythema and warmth noted of the foot as well. He has been hit or miss with regard to hyperbaric oxygen therapy tells me that at this point he took this week off because he was extremely claustrophobic and having a lot of issues he plans to start back next week. Nonetheless he has been given some medication by Dr. Leanord Hawking as well to help calm things down which again may help him. Hopefully he can get back in hyperbarics as I think this is likely necessary. Nonetheless there does seem to be evidence of cellulitis noted today as well and in general I am a little concerned in that regard. His wound unfortunately is significant on this left foot and is right where he takes the brunt of the force secondary to the Charcot arthropathy. I do not think a total contact cast is ideal due to the fact that he unfortunately is draining much too significantly he is also not happy with the idea of using a cast therefore he states he would not want to do that anyway. 09/16/2021 upon evaluation today patient's plantar foot ulcer unfortunately continues to show signs of issues here. He has been in the hospital due to trying to detox himself from alcohol he had withdrawal symptoms and subsequently was hospitalized. During that time it was recommended by both orthopedics as well as infectious disease apparently that he proceed with amputation. With that being said they discussed with him the risk of not doing so. This is well- documented in the encounter. With that being said the patient does not want to proceed down that road and tells me that he declined their advice in that  regard. Subsequently our goal then is to try to do what we can to try to get this thing healed and closed. I think this means he is getting need to stay off of it is much as possible he does have a wheelchair at home that he tells me he can use I think that that is going to be something that he does need to do. 09/23/2021 upon evaluation today patient continues to have significant issues here with his foot ulcer which is plantar on the left foot. Subsequently again he does have a history of Charcot foot he also has an issue of having had osteomyelitis as well as an open wound for some time here. Its been recommended multiple times for him to have an amputation although that is not something that he is really interested or wanting to do. 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 Subsequently this means that he also has a gait abnormality that is causing issues with his  walking and causing abnormal pressures in the central part of his foot this is the reason he has a wound. Nonetheless I want to see what I can do about trying to get a boot to help with stabilization and offloading of working to see what we can do in that regard. 09/30/2021 upon evaluation today patient appears to be doing well with regard to his plantar foot ulcer. He still has significant granular hypergranulation but again this is much less than what it was even last time I saw him last week. Fortunately I think we are headed in the right direction. This is definitely something we could consider a skin graft or something along those lines if we can get it flattened out enough and get the drainage from being so significant. I feel like we are making some headway here. 10/07/2021 upon evaluation today patient appears to be doing decently well in regard to his wound. Fortunately there does not appear to be any signs of infection overall I feel like it is actually showing some signs of  improvement. He still has some hypergranulation but this is much less than its been. 10/21/2021 upon evaluation today patient appears to be doing well with regard to his wound is actually showing some signs of improvement which is great. We have been doing the chemical cauterization with silver nitrate as well as debridement as needed and again has been using silver alginate up to this point. With that being said he does have some Hydrofera Blue at home he wonders if that can be beneficial at this point. 11/25/2021 upon evaluation today patient appears to be doing somewhat poorly in regard to his plantar foot ulcer. He is also been having some issues with his congestive heart failure. He has recently been in the hospital. I did review those notes as well and apparently his heart failure has been somewhat out of control. He is actually being managed as an outpatient in this regard but nonetheless this is something that can still be kept a close eye on according to the notes and what I see. Fortunately I do not see any signs of active infection at this time which is great news. Nonetheless I am concerned about the boggy central portion of the wound which I think is going to likely open up in the near future. Electronic Signature(s) Signed: 11/25/2021 10:52:40 AM By: Worthy Keeler PA-C Entered By: Worthy Keeler on 11/25/2021 10:52:40 -------------------------------------------------------------------------------- Physical Exam Details Patient Name: Date of Service: DUTCH, PIECH IG 11/25/2021 10:00 A M Medical Record Number: [id number] Patient Account Number: [id number] Date of Birth/Sex: Treating RN: [birth year] (61 y.o. [name] Primary Care Provider: Billey Gosling Other Clinician: Referring Provider: Treating Provider/Extender: Landis Martins Weeks in Treatment: 24 Constitutional Obese and well-hydrated in no acute distress. Respiratory normal breathing without  difficulty. Psychiatric this patient is able to make decisions and demonstrates good insight into disease process. Alert and Oriented x 3. pleasant and cooperative. Notes Upon inspection patient's wound bed actually showed signs of somewhat poor granulation in general over the central portion of the wound bed. This appears to be very boggy and that seems to be breaking down in my opinion. Very concerned about this and I discussed that with the patient today. I do think we probably need to get an updated MRI to see if things have worsened dramatically since last checked. I feel like this is not headed in the right direction. He is still on the Augmentin  by infectious disease. Electronic Signature(s) Signed: 11/25/2021 10:53:26 AM By: Worthy Keeler PA-C Entered By: Worthy Keeler on 11/25/2021 10:53:26 -------------------------------------------------------------------------------- Physician Orders Details Patient Name: Date of Service: HOOMAN, ECKSTROM IG 11/25/2021 10:00 A M Medical Record Number: [id number] Patient Account Number: [id number] Date of Birth/Sex: Treating RN: 1960-11-01 (61 y.o. [name] Primary Care Provider: Billey Gosling Other Clinician: Referring Provider: Treating Provider/Extender: Adele Schilder in Treatment: 24 Verbal / Phone Orders: No Diagnosis Coding ICD-10 Coding Code Description E11.621 Type 2 diabetes mellitus with foot ulcer M86.372 Chronic multifocal osteomyelitis, left ankle and foot L97.528 Non-pressure chronic ulcer of other part of left foot with other specified severity M19.072 Primary osteoarthritis, left ankle and foot M14.672 Charcot's joint, left ankle and foot Follow-up Appointments ppointment in 1 week. Margarita Grizzle Wednesday Room 1 Return A Bathing/ Shower/ Hygiene May shower and wash wound with soap and water. - prior to dressing change Edema Control - Lymphedema / SCD / Other Elevate legs to the level of the heart or  above for 30 minutes daily and/or when sitting, a frequency of: - throughout the day Avoid standing for long periods of time. Exercise regularly Moisturize legs daily. Compression stocking or Garment 30-40 mm/Hg pressure to: - wear the Juxtalite HD to right and left leg. apply in the morning and remove at night. Off-Loading Open toe surgical shoe to: - left foot Other: - minimal weight bearing left foot use wheelchair for mobility. Additional Orders / Instructions Follow [facility] wound care orders this week; continue Home Health for wound care. May utilize formulary equivalent dressing for wound treatment orders unless otherwise specified. - [facility] home [facility] twice a week. Wound center weekly. Wound Treatment Wound #3 - Foot Wound Laterality: Plantar, Left Cleanser: Soap and Water [facility]) Every Other Day/15 Days Discharge Instructions: May shower and wash wound with dial antibacterial soap and water prior to dressing change. Cleanser: Wound Cleanser (Home Health) (Generic) Every Other Day/15 Days Discharge Instructions: Cleanse the wound with wound cleanser prior to applying a clean dressing using gauze sponges, not tissue or cotton balls. Peri-Wound Care: Zinc Oxide Ointment 30g tube [facility]) Every Other Day/15 Days Discharge Instructions: Apply Zinc Oxide as needed for any maceration or redness to periwound with each dressing change Peri-Wound Care: Sween Lotion (Moisturizing lotion) (Home Health) Every Other Day/15 Days Discharge Instructions: Apply moisturizing lotion as directed Prim Dressing: Hydrofera Blue Ready Foam, 2.5 x2.5 in [facility]) Every Other Day/15 Days ary Discharge Instructions: Apply to wound bed as instructed Secondary Dressing: Zetuvit Plus 4x8 in [facility]) (Generic) Every Other Day/15 Days Discharge Instructions: Apply over primary dressing as directed. Secured With: [employer], 4.5x3.1 (in/yd) (Home Health)  (Generic) Every Other Day/15 Days Discharge Instructions: Secure with Kerlix as directed. Secured With: 29M Medipore H Soft Cloth Surgical Tape, 2x2 (in/yd) (Home Health) (Generic) Every Other Day/15 Days Discharge Instructions: Secure dressing with tape as directed. Radiology MRI, left foot with and without contrast - MRI with and without contrast of left foot related to non healing foot ulcer history of osteomyelitis. Looking for any progression or change in osteomyelitis. CPT code - (ICD10 [id number] - Chronic multifocal osteomyelitis, left ankle and foot) Electronic Signature(s) Signed: 11/25/2021 5:11:08 PM By: Worthy Keeler PA-C Signed: 11/25/2021 5:26:44 PM By: Deon Pilling RN, BSN Entered By: Deon Pilling on 11/25/2021 10:50:12 Prescription 11/25/2021 -------------------------------------------------------------------------------- Cammie Sickle PA Patient Name: Provider: 1961-08-26 [id number] Date of Birth: NPI#:  Jerilynn Mages [id number] Sex: DEA #: [id number] Phone #: License #: Powers Patient Address: [place] [address] [place], [place] [address] [place], [place] [address] [phone] Allergies Claritin Provider's Orders MRI, left foot with and without contrast - ICD10: [id number] - MRI with and without contrast of left foot related to non healing foot ulcer history of osteomyelitis. Looking for any progression or change in osteomyelitis. CPT code Hand Signature: Date(s): Electronic Signature(s) Signed: 11/25/2021 5:11:08 PM By: Worthy Keeler PA-C Signed: 11/25/2021 5:26:44 PM By: Deon Pilling RN, BSN Entered By: Deon Pilling on 11/25/2021 10:50:13 -------------------------------------------------------------------------------- Problem List Details Patient Name: Date of Service: [name] 11/25/2021 10:00 A M Medical Record Number: [id number] Patient Account Number: [id number] Date of Birth/Sex: Treating  RN: 1961/07/15 (61 y.o. [name] Primary Care Provider: Billey Gosling Other Clinician: Referring Provider: Treating Provider/Extender: Adele Schilder in Treatment: 24 Active Problems ICD-10 Encounter Code Description Active Date MDM Diagnosis E11.621 Type 2 diabetes mellitus with foot ulcer 06/04/2021 No Yes M86.372 Chronic multifocal osteomyelitis, left ankle and foot 06/04/2021 No Yes L97.528 Non-pressure chronic ulcer of other part of left foot with other specified 06/04/2021 No Yes severity M19.072 Primary osteoarthritis, left ankle and foot 09/23/2021 No Yes M14.672 Charcot's joint, left ankle and foot 06/04/2021 No Yes Inactive Problems Resolved Problems Electronic Signature(s) Signed: 11/25/2021 4:24:10 PM By: Lorrin Jackson Signed: 11/25/2021 5:11:08 PM By: Worthy Keeler PA-C Previous Signature: 11/25/2021 10:03:50 AM Version By: Worthy Keeler PA-C Entered By: Lorrin Jackson on 11/25/2021 10:08:10 -------------------------------------------------------------------------------- Progress Note Details Patient Name: Date of Service: [name] 11/25/2021 10:00 A M Medical Record Number: [id number] Patient Account Number: [id number] Date of Birth/Sex: Treating RN: Jun 10, 1961 (61 y.o. [name] Primary Care Provider: Billey Gosling Other Clinician: Referring Provider: Treating Provider/Extender: Adele Schilder in Treatment: 24 Subjective Chief Complaint Information obtained from Patient Bilateral foot diabetic ulcers History of Present Illness (HPI) 10/31/2019 upon evaluation today patient appears to be doing somewhat poorly in regard to his bilateral plantar feet. He has wounds that he tells me have been present since 2012 intermittently off and on. Most recently this has been open for at least the past 6 months to a year. He has been trying to treat this in different ways using Santyl along with various other dressings  including Medihoney and even at one point Xeroform. Nothing really has seem to get this completely closed. He was recently in the hospital for cellulitis of his leg subsequently he did have x-rays as well as MRIs that showed negative for any signs of osteomyelitis in regard to the wounds on his feet. Fortunately there is no signs of systemic infection at this time. No fevers, chills, nausea, vomiting, or diarrhea. Patient has previously used Darco offloading shoes as far as frontal floaters as well as postop surgical shoes. He has never been in a total contact cast that may be something we need to strongly consider here. Patient's most recent hemoglobin A1c 1 month ago was 5.3 seems to be very well controlled which is great. Subsequently he has seen vascular as well as podiatry. His ABIs are 1.07 on the left and 1.14 on the right he seems to be doing well he does have chronic venous stasis. 11/07/2019 upon evaluation today patient appears to be doing well with regard to his wounds all things considered. I do not see any severe worsening he still  has some callus buildup on the right more than the left he notes that he has been probably more active than he should as far as walking is concerned is just very hard to not be active. He knows he needs to be more careful in this regard however. He is willing to give the cast a try at this point although he notes that he is a little nervous about this just with regard to balance although he will be very careful and obviously if he has any trouble he knows to contact the office and let me know. 1/22; patient is in for his obligatory first total contact cast change. Our intake nurse reported a very large amount of drainage which is spelled out over to the surrounding skin. Has bilateral diabetic foot wounds. He has Charcot feet. We have been using silver alginate on his wounds. 11/14/2019 on evaluation today patient is actually seeming to make good improvement in  regard to his bilateral plantar foot wounds. We have been using a cast on the left side and on the right side he has been using dressings he is changing up his own accord. With that being said he tells me that he is also not walking as much just due to how unsteady he feels. He takes it easy when he does have to walk and when he does not have to walk he is resting. This is probably help in his right foot as well has the left foot which is actually measuring better. In fact both are measuring better. Overall I am very pleased with how things seem to be progressing. The patient does have some odor on the left foot this does have me concerned about the possibility of infection, and actually probably go ahead and put him on antibiotics today as well as utilizing a continuation of the cast on the left foot I think that will be fine we probably just need to bring him in sooner to change this not last a whole week. 1/29; we brought the patient back today for a total contact cast change on the left out of concern for excessive drainage. We are using drawtex over the wound as the primary dressing 11/21/2019 on evaluation today patient appears to be doing well with regard to his left plantar foot. In fact both foot ulcers actually seem to be doing pretty well. Nonetheless he is having a lot of drainage on the left at this time and again we did obtain a wound culture did show positive for Staph aureus that was reviewed by myself today as well. Nonetheless he is on Bactrim which was shown to be sensitive that should be helping in this regard. Fortunately there is no signs of infection systemically at this point. 2/5; back in clinic today for a total contact cast change apparently secondary to very significant drainage. Still using drawtex 11/28/2019 upon evaluation today patient appears to be doing well with regard to his wounds. The right foot is doing okay as measured about the same in my opinion. The left foot is  actually showing signs of significant improvement is measuring smaller there is a lot of hyper granulation likely due to the continued drainage at this point. We did obtain approval for a snap VAC I think that is good to be appropriate for him and will likely help this tremendously underneath the cast. He is definitely in agreement with proceeding with such. 2/12; patient came in today after his snap VAC lost suction. Brought in to  see one of our nurses. The dressing was replaced and then we put the cast back on and rehooked up the snap VAC. Apparently his wound looked very good per our intake nurse. 2/15; again we replaced the cast on Friday. By Saturday the snap VAC and light suction. He called this morning he comes in acutely. The wounds look fine however the VAC is not functioning. We replaced the cast using silver alginate as the primary dressing backed with Kerramax. The snap VAC was not replaced 12/05/2019 upon evaluation today patient appears to be doing better in regard to his left plantar foot ulcer. Fortunately there is no signs of active infection at this time. Unfortunately he is continuing to have issues with the right foot he is really not making any progress here things seem to be somewhat stagnant to be honest. The depth has increased but that is due to me having debrided the wound in the past based on what I am seeing. 12/12/2019 on evaluation today patient appears to be doing more poorly in regard to the left lower extremity. He has some erythema spreading up the side of his foot I am concerned about infection again at this point. Unfortunately he has been seeing improvement with a total contact cast but I do not think we should put that on today. On his right plantar foot he continues to have significant drainage this is actually measuring deeper I really do not feel like you are making any progress whatsoever. I have prescribed Granix for him unfortunately his insurance apparently was  going to cost him a $500 co-pay. 12/19/2019 upon evaluation today patient actually appears to be doing better in regard to both wounds. With that being said he actually did get the reGranix which he had to pay $500 for. With that being said it does look like that he is actually made some improvement based on what I am seeing at this point with the reGranix. Obviously if he is going to continue this we are going to do something about trying to get him some help in covering the cost. 12/26/2019 on evaluation today patient appears to be doing really much better even compared to last week. Overall the wound seems to be much better even compared to last week and last week was better than the week before. Since has been using the reGranix his symptoms have improved significantly. With that being said the issue right now is simply that this is a very expensive medication for him the first dose cost him $500. Upon inspection patient's wound bed actually is however dramatically improved compared to before he started this 2 weeks ago. 01/02/2020 upon evaluation today patient appears to actually be doing well. He still had a little bit of reGranix left that has been using in small amounts he just been applying it every other day instead of every day in order to make it go longer. Overall we are still seeing excellent improvement he is measuring smaller looking better healthier tissue and everything seems to be pointing to this headed in the right direction. Fortunately there is no evidence of infection either which is also excellent news. He does have his MRI coming up within the next week. 01/09/2020 upon evaluation today patient appears to be doing a little worse today compared to previous week's evaluation. He is actually been out of the reGranix at this point. He has been trying to make the stretch out so he has been changing the dressings on a regular set  schedule like he was previous. I think this has made a  difference. Fortunately there is no signs of active infection at this time. No fevers, chills, nausea, vomiting, or diarrhea. 01/16/2020 upon evaluation today patient appears to be doing well with regard to his left plantar foot ulcer. The right plantar foot still shows some significant depth at this point. Fortunately there is no signs of active infection at this time. 01/30/2020 upon evaluation today patient appears to be doing about the same in regard to his right plantar foot ulcer there is still some depth here and we had to wait till he actually switched over to his new insurance to get approval for the MRI under his new insurance plan. With that being said he now has switched as of April 1. Fortunately there is no signs of active infection at this time. Overall in regard to his left foot ulcer this seems to be doing much better and I am actually very pleased with how things are going. With that being said it is not quite as much progress as we were seeing with the reGranix but at the same time he has had trouble getting this apparently there is been some hindrance here. I Georgina Peer try to actually send this to [facility] that was recommended by the drug rep to me. 02/13/20 upon evaluation today patient appears to be doing better in regard to his left foot ulcer this is great news. Unfortunately the right foot ulcer is not really significantly better at this time. There is no signs of active infection systemically though he did have his MRI which showed unfortunately he does have infection noted including an abscess in the foot. There is also marrow changes noted which are consistent with osteomyelitis based on the radiology review and interpretation. Unfortunately considering that the wound is not really making the progress that we will he would like to be seen I think that this is an indication that he may need some further referral both infectious disease as well as potentially to podiatrist to  see if there is anything that can be done to help with the situation that were dealing with here. The left foot again is doing great. READMISSION 06/04/2021 This is a 61 year old man who was in the clinic in 2021 followed by Jeri Cos for areas on the right and left foot. He developed a left foot infection and was referred to ID. He left the clinic in a nonhealed state and was followed for a period of [facility] Kettle. Apparently things really deteriorated in early July when he was admitted to hospital from 04/27/2021 through 05/06/2021 with sepsis secondary to a left foot infection. His blood cultures were negative. An MRI suggested fifth metatarsal osteomyelitis a left ankle septic joint. He was treated with vancomycin and ertapenem which he is still taking and may just about be finishing. He was seen by orthopedics and the patient adamantly refused to BKA. As far as I can tell he did not have the ankle aspirated I am not exactly sure what the issue was here. Since he has been discharged she is at [facility] for rehabilitation. He was last seen by Dr. Drucilla Schmidt on 05/20/2021 he noted osteo of the tibial talar bone cuboid and fifth metatarsal which is even more extensive than what was suggested by the MRI. He is apparently going for a consultation with orthopedic surgery in [place] sometime next week. I received a call about this man 2  weeks ago from Dr. Gwenlyn Found who follows him for the possibility of PAD. ABIs I think done in the office showed a ABI on the right of 1.05 at the PTA and 0.99 at the PTA on the left. He had a DVT rule out in the left leg that was negative for the DVT. Past medical history is extensive and includes diastolic heart failure, right first metatarsal head ulcer in 2021, excision of the right second ray by Dr. Cannon Kettle on 03/13/2020, hypertension, hypothyroidism. Left total hip replacement, right total knee replacement, carpal tunnel syndrome,  obstructive sleep apnea alcohol abuse with cirrhosis although the patient denies current alcohol intake. The patient does not think he is a diabetic however looking through [place] link I see 2 HgbAic's of this year that were greater than equal to 6.5 which by definition makes him diabetic. Nevertheless he is not on any treatment and does not check his blood sugars. The patient is now back home out of the nursing home. Saw Dr. Drucilla Schmidt last week he was taken off vancomycin and ertapenem on August 22 and now is on doxycycline on Augmentin. He also saw Dr. Buford Dresser who is his orthopedic surgeon in [place] he recommended a [employer]. He has been using Medihoney. 9/6I have been having trouble getting hyperbarics approved through our prior authorization process. Even though he had a limb threatening infection in the left foot and probably the left ankle there glitches in how some of the reports are worded also some of the consultants. In any case I am going to repeat his sedimentation rate and C-reactive protein. I am generally not in favor of doing things like this as they really do not alter the plan of care from my point of view however I am going to need to demonstrate that these remain high in order to get this through forhyperbaric treatment for chronic refractory osteomyelitis 9/13; following this man for a wound on his left plantar foot in the setting of type 2 diabetes and Charcot deformity. He has underlying chronic refractory osteomyelitis. Follow-up sedimentation rate and C-reactive protein were both elevated but the C-reactive protein was down to 1.4, sedimentation rate at 70. Sedimentation rate was only slightly down from previous at 85. His wound is measuring slightly smaller. 9/20; patient started hyperbaric oxygen therapy today and tolerated treatment well. This is for the underlying osteomyelitis. He remains on antibiotics but thinks he is getting close to finishing. The wound on the  plantar aspect of his foot is the other issue we are following here. He is using Medihoney The patient has a Charcot foot in the setting of type 2 diabetes. He is going to need a total contact cast although his partner was away this week and we elected to delay this till next week 9/27; patient still tolerating hyperbaric oxygen well. Wound looked generally healthy not much depth under illumination still 100% covered in fibrinous debris. Raised callused edges around the wound he was prepared for a total contact cast. We have been using Medihoney 9/30; patient is back for his first obligatory total contact cast change. We are using Hydrofera Blue. Noted by our intake nurse to have a lot of drainage or at least a moderate amount of drainage. I am not sure I was previously aware of this 10/4; patient arrives today with a lot of drainage under the cast. When he had it changed last Friday there was also a similar amount of drainage. Her intake nurse says that they tried a  wound VAC on him perhaps while I was on vacation in August under the cast but that did not work. In my experience that has not been unusual. We have been using Hydrofera Blue with all the secondary absorbers. The drainage today went right through all of our dressings. The patient is concerned about his foot being in a cast without much drainage. He is tolerating HBO well. There has been improvements in the wound in the mid part of his foot in the setting of a Charcot deformity 10/7; patient presents for cast change. He has no issues or complaints today. He denies signs of infection. 10/11; patient presents for cast change. At this time he would like to take a break from the cast. He would like to do daily dressing changes with Hydrofera Blue. He currently denies signs of infection. 10/14; his cast was taken off last week at his request. He arrives in the clinic with [facility]. He has been changing the dressing himself. He has  way too much edema in the left foot and leg to consider a total contact cast. I do not really know the issue here. He does have chronic venous insufficiency His wife stopped me in the clinic earlier in the week to report he is drinking again and she is concerned. I am uncertain whether there are other issues 10/18; he arrived in clinic last week having bilateral lower extremity edema likely secondary to chronic venous insufficiency there was too much edema in the left leg to apply a total contact cast. I put him in compression on the left leg to control the swelling. This week he cut the wrap on the foot for reasons that are unclear however today he arrives in clinic with a smaller left leg but massive edema in the left foot. He is supposed to be wearing a juxta lite stocking on the right leg but he is not wearing the contact layer. His attendance at hyperbaric oxygen has been dwindling, he did not dive yesterday and he did not dive today concerned about hyperbarics causing swelling. I looked back in his record his last echo was in 2020 essentially normal left and right ventricular function. Last BUN and creatinine were done in July this was normal. He is having a lot of drainage in the left plantar foot wound 10/25; again he comes in today having missed HBO yesterday. Macerated skin around the wound which really does not look very good at all. A lot of edema in the left foot but an improvement in edema on the left leg. We wrapped him last week because of the amount of swelling in the left leg. We could not apply a total contact cast. The patient states that he wants to be able to change his dressing himself. I might consider this if he had stockings to control the swelling. He said he be here for HBO tomorrow. I will check the degree of erythema in his forefoot which I have marked. He is on doxycycline and Augmentin which was renewed by Dr. Drucilla Schmidt but I cannot see a follow-up note, follow-up  inflammatory markers etc. I 1 point he said he was going to see his orthopedic surgeon in [place] I am not sure if he ever did this. I do not know why he has not followed up with infectious disease, he says he was not given an appointment but he is still on Augmentin doxycycline 11/1; he did not do the lab work I ordered last week. Still on  Augmentin and doxycycline. Silver alginate and he is changing this daily using his own juxta lite stocking. The surface of the wound does not look too bad. No real epithelialization however. The patient was seen today along with HBO 08/26/2021 upon evaluation today patient is being seen at his request by myself he wanted to transfer care to see me. With that being said he has been seeing Dr. Dellia Nims since he came back in August of this year. Unfortunately he tells me he still having quite a bit of drainage. He is also significant erythema and warmth noted of the foot as well. He has been hit or miss with regard to hyperbaric oxygen therapy tells me that at this point he took this week off because he was extremely claustrophobic and having a lot of issues he plans to start back next week. Nonetheless he has been given some medication by Dr. Dellia Nims as well to help calm things down which again may help him. Hopefully he can get back in hyperbarics as I think this is likely necessary. Nonetheless there does seem to be evidence of cellulitis noted today as well and in general I am a little concerned in that regard. His wound unfortunately is significant on this left foot and is right where he takes the brunt of the force secondary to the Charcot arthropathy. I do not think a total contact cast is ideal due to the fact that he unfortunately is draining much too significantly he is also not happy with the idea of using a cast therefore he states he would not want to do that anyway. 09/16/2021 upon evaluation today patient's plantar foot ulcer unfortunately continues to show  signs of issues here. He has been in the hospital due to trying to detox himself from alcohol he had withdrawal symptoms and subsequently was hospitalized. During that time it was recommended by both orthopedics as well as infectious disease apparently that he proceed with amputation. With that being said they discussed with him the risk of not doing so. This is well- documented in the encounter. With that being said the patient does not want to proceed down that road and tells me that he declined their advice in that regard. Subsequently our goal then is to try to do what we can to try to get this thing healed and closed. I think this means he is getting need to stay off of it is much as possible he does have a wheelchair at home that he tells me he can use I think that that is going to be something that he does need to do. 09/23/2021 upon evaluation today patient continues to have significant issues here with his foot ulcer which is plantar on the left foot. Subsequently again he does have a history of Charcot foot he also has an issue of having had osteomyelitis as well as an open wound for some time here. Its been recommended multiple times for him to have an amputation although that is not something that he is really interested or wanting to do. 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 Subsequently this means that he also has a gait abnormality that is causing issues with his walking and causing abnormal pressures in the central part of his foot this is the reason he has a wound. Nonetheless I want to see what I can do about trying to get a boot to help with stabilization and offloading of working to see what  we can do in that regard. 09/30/2021 upon evaluation today patient appears to be doing well with regard to his plantar foot ulcer. He still has significant granular hypergranulation but again this is much less than what it was even last time I  saw him last week. Fortunately I think we are headed in the right direction. This is definitely something we could consider a skin graft or something along those lines if we can get it flattened out enough and get the drainage from being so significant. I feel like we are making some headway here. 10/07/2021 upon evaluation today patient appears to be doing decently well in regard to his wound. Fortunately there does not appear to be any signs of infection overall I feel like it is actually showing some signs of improvement. He still has some hypergranulation but this is much less than its been. 10/21/2021 upon evaluation today patient appears to be doing well with regard to his wound is actually showing some signs of improvement which is great. We have been doing the chemical cauterization with silver nitrate as well as debridement as needed and again has been using silver alginate up to this point. With that being said he does have some Hydrofera Blue at home he wonders if that can be beneficial at this point. 11/25/2021 upon evaluation today patient appears to be doing somewhat poorly in regard to his plantar foot ulcer. He is also been having some issues with his congestive heart failure. He has recently been in the hospital. I did review those notes as well and apparently his heart failure has been somewhat out of control. He is actually being managed as an outpatient in this regard but nonetheless this is something that can still be kept a close eye on according to the notes and what I see. Fortunately I do not see any signs of active infection at this time which is great news. Nonetheless I am concerned about the boggy central portion of the wound which I think is going to likely open up in the near future. Objective Constitutional Obese and well-hydrated in no acute distress. Vitals Time Taken: 10:12 AM, Weight: 270 lbs, Temperature: 98.7 F, Pulse: 90 bpm, Respiratory Rate: 20 breaths/min,  Blood Pressure: 131/77 mmHg. Respiratory normal breathing without difficulty. Psychiatric this patient is able to make decisions and demonstrates good insight into disease process. Alert and Oriented x 3. pleasant and cooperative. General Notes: Upon inspection patient's wound bed actually showed signs of somewhat poor granulation in general over the central portion of the wound bed. This appears to be very boggy and that seems to be breaking down in my opinion. Very concerned about this and I discussed that with the patient today. I do think we probably need to get an updated MRI to see if things have worsened dramatically since last checked. I feel like this is not headed in the right direction. He is still on the Augmentin by infectious disease. Integumentary (Hair, Skin) Wound #3 status is Open. Original cause of wound was Gradually Appeared. The date acquired was: 03/18/2021. The wound has been in treatment 24 weeks. The wound is located on the [place]. The wound measures 3.2cm length x 3.5cm width x 1cm depth; 8.796cm^2 area and 8.796cm^3 volume. There is Fat Layer (Subcutaneous Tissue) exposed. There is no tunneling noted, however, there is undermining starting at 6:00 and ending at 12:00 with a maximum distance of 0.3cm. There is a large amount of serosanguineous drainage noted. The wound margin  is distinct with the outline attached to the wound base. There is large (67-100%) pink, pale granulation within the wound bed. There is no necrotic tissue within the wound bed. General Notes: Calloused Periwound Assessment Active Problems ICD-10 Type 2 diabetes mellitus with foot ulcer Chronic multifocal osteomyelitis, left ankle and foot Non-pressure chronic ulcer of other part of left foot with other specified severity Primary osteoarthritis, left ankle and foot Charcot's joint, left ankle and foot Procedures Wound #3 Pre-procedure diagnosis of Wound #3 is a Diabetic Wound/Ulcer of  the Lower Extremity located on the Left,Plantar Foot .Severity of Tissue Pre Debridement is: Fat layer exposed. There was a Excisional Skin/Subcutaneous Tissue Debridement with a total area of 12.95 sq cm performed by Worthy Keeler, PA. With the following instrument(s): Curette to remove Viable and Non-Viable tissue/material. Material removed includes Callus, Subcutaneous Tissue, Slough, Skin: Dermis, Skin: Epidermis, and Fibrin/Exudate after achieving pain control using Lidocaine 4% T opical Solution. A time out was conducted at 10:37, prior to the start of the procedure. A Moderate amount of bleeding was controlled with Pressure. The procedure was tolerated well with a pain level of 0 throughout and a pain level of 0 following the procedure. Post Debridement Measurements: 3.2cm length x 3.5cm width x 1cm depth; 8.796cm^3 volume. Character of Wound/Ulcer Post Debridement is stable. Severity of Tissue Post Debridement is: Fat layer exposed. Post procedure Diagnosis Wound #3: Same as Pre-Procedure Plan Follow-up Appointments: Return Appointment in 1 week. Margarita Grizzle Wednesday Room 1 Bathing/ Shower/ Hygiene: May shower and wash wound with soap and water. - prior to dressing change Edema Control - Lymphedema / SCD / Other: Elevate legs to the level of the heart or above for 30 minutes daily and/or when sitting, a frequency of: - throughout the day Avoid standing for long periods of time. Exercise regularly Moisturize legs daily. Compression stocking or Garment 30-40 mm/Hg pressure to: - wear the Juxtalite HD to right and left leg. apply in the morning and remove at night. Off-Loading: Open toe surgical shoe to: - left foot Other: - minimal weight bearing left foot use wheelchair for mobility. Additional Orders / Instructions: Follow Nutritious Diet Home Health: New wound care orders this week; continue Home Health for wound care. May utilize formulary equivalent dressing for wound treatment  orders unless otherwise specified. - [facility] home health twice a week. Wound center weekly. Radiology ordered were: MRI, left foot with and without contrast - MRI with and without contrast of left foot related to non healing foot ulcer history of osteomyelitis. Looking for any progression or change in osteomyelitis. CPT code WOUND #3: - Foot Wound Laterality: Plantar, Left Cleanser: Soap and Water [facility]) Every Other Day/15 Days Discharge Instructions: May shower and wash wound with dial antibacterial soap and water prior to dressing change. Cleanser: Wound Cleanser (Home Health) (Generic) Every Other Day/15 Days Discharge Instructions: Cleanse the wound with wound cleanser prior to applying a clean dressing using gauze sponges, not tissue or cotton balls. Peri-Wound Care: Zinc Oxide Ointment 30g tube [facility]) Every Other Day/15 Days Discharge Instructions: Apply Zinc Oxide as needed for any maceration or redness to periwound with each dressing change Peri-Wound Care: Sween Lotion (Moisturizing lotion) (Home Health) Every Other Day/15 Days Discharge Instructions: Apply moisturizing lotion as directed Prim Dressing: Hydrofera Blue Ready Foam, 2.5 x2.5 in [facility]) Every Other Day/15 Days ary Discharge Instructions: Apply to wound bed as instructed Secondary Dressing: Zetuvit Plus 4x8 in [facility]) (Generic) Every Other Day/15 Days  Discharge Instructions: Apply over primary dressing as directed. Secured With: [employer], 4.5x3.1 (in/yd) (Home Health) (Generic) Every Other Day/15 Days Discharge Instructions: Secure with Kerlix as directed. Secured With: 67M Medipore H Soft Cloth Surgical T ape, 2x2 (in/yd) (Home Health) (Generic) Every Other Day/15 Days Discharge Instructions: Secure dressing with tape as directed. 1. Would recommend currently that we go ahead and get the patient set up for an MRI. I think the sooner that we get this done the better. This would be of  the left foot. 2. Also can recommend that we go ahead and continue with the [facility] which I think is probably as good as anything at this point. 3. I would also suggest that we continue with the roll gauze to secure in place we try to get a defender offloading boot but unfortunately this was not something they make in the size that he would need. It supposed to be available but nobody actually has it available. We will see patient back for reevaluation in 1 week here in the clinic. If anything worsens or changes patient will contact our office for additional recommendations. Electronic Signature(s) Signed: 11/25/2021 10:54:14 AM By: Worthy Keeler PA-C Entered By: Worthy Keeler on 11/25/2021 10:54:13 -------------------------------------------------------------------------------- SuperBill Details Patient Name: Date of Service: HIMMAT, PROPPS IG 11/25/2021 Medical Record Number: [id number] Patient Account Number: [id number] Date of Birth/Sex: Treating RN: [birth year] (61 y.o. [name] Primary Care Provider: Billey Gosling Other Clinician: Referring Provider: Treating Provider/Extender: Landis Martins Weeks in Treatment: 24 Diagnosis Coding ICD-10 Codes Code Description E11.621 Type 2 diabetes mellitus with foot ulcer M86.372 Chronic multifocal osteomyelitis, left ankle and foot L97.528 Non-pressure chronic ulcer of other part of left foot with other specified severity M19.072 Primary osteoarthritis, left ankle and foot M14.672 Charcot's joint, left ankle and foot Facility Procedures CPT4 Code: [id number] Description: [id number] - DEB SUBQ TISSUE 20 SQ CM/< ICD-10 Diagnosis Description L97.528 Non-pressure chronic ulcer of other part of left foot with other specified seve Modifier: rity Quantity: 1 Physician Procedures : CPT4 Code Description Modifier [id number] - WC PHYS LEVEL 4 - EST PT 25 ICD-10 Diagnosis Description E11.621 Type 2 diabetes mellitus with foot  ulcer M86.372 Chronic multifocal osteomyelitis, left ankle and foot L97.528 Non-pressure chronic ulcer of  other part of left foot with other specified severity M19.072 Primary osteoarthritis, left ankle and foot Quantity: 1 : [id number] - WC PHYS SUBQ TISS 20 SQ CM ICD-10 Diagnosis Description L97.528 Non-pressure chronic ulcer of other part of left foot with other specified severity Quantity: 1 Electronic Signature(s) Signed: 11/25/2021 10:54:27 AM By: Worthy Keeler PA-C Entered By: Worthy Keeler on 11/25/2021 10:54:27

## 2021-11-25 NOTE — Progress Notes (Addendum)
[name], [name] ([id number]) Visit Report for 11/25/2021 Arrival Information Details Patient Name: Date of Service: [name], [name] 11/25/2021 10:00 A M Medical Record Number: [id number] Patient Account Number: [id number] Date of Birth/Sex: Treating RN: Sep 04, 1961 (61 y.o. [name] Primary Care Irmgard Rampersaud: [name] Other Clinician: Referring Samoria Fedorko: Treating Obinna Ehresman/Extender: [name] in Treatment: 24 Visit Information History Since Last Visit Added or deleted any medications: No Patient Arrived: [name] Any new allergies or adverse reactions: No Arrival Time: 10:08 Had a fall or experienced change in No Transfer Assistance: None activities of daily living that may affect Patient Identification Verified: Yes risk of falls: Secondary Verification Process Completed: Yes Signs or symptoms of abuse/neglect since No Patient Requires Transmission-Based Precautions: No last visito Patient Has Alerts: Yes Hospitalized since last visit: No Implantable device outside of the clinic No excluding cellular tissue based products placed in the center since last visit: Has Dressing in Place as Prescribed: Yes Has Footwear/Offloading in Place as Yes Prescribed: Left: Surgical Shoe with Pressure Relief Insole Pain Present Now: No Electronic Signature(s) Signed: 11/25/2021 4:24:10 PM By: [name] Entered By: [name] on 11/25/2021 10:12:01 -------------------------------------------------------------------------------- Encounter Discharge Information Details Patient Name: Date of Service: [name] IG 11/25/2021 10:00 A M Medical Record Number: [id number] Patient Account Number: [id number] Date of Birth/Sex: Treating RN: 05-Nov-1960 (61 y.o. [name] Primary Care Nitzia Perren: [name] Other Clinician: Referring Royalti Schauf: Treating Marquerite Forsman/Extender: [name] in Treatment: 24 Encounter Discharge Information Items  Post Procedure Vitals Discharge Condition: Stable Temperature (F): 98.7 Ambulatory Status: Walker Pulse (bpm): 90 Discharge Destination: Home Respiratory Rate (breaths/min): 20 Transportation: Private Auto Blood Pressure (mmHg): 131/77 Accompanied By: self Schedule Follow-up Appointment: Yes Clinical Summary of Care: Electronic Signature(s) Signed: 11/25/2021 5:26:44 PM By: Shawn Stall RN, BSN Entered By: Shawn Stall on 11/25/2021 10:51:56 -------------------------------------------------------------------------------- Lower Extremity Assessment Details Patient Name: Date of Service: [name], [name] IG 11/25/2021 10:00 A M Medical Record Number: [id number] Patient Account Number: [id number] Date of Birth/Sex: Treating RN: Aug 01, 1961 (61 y.o. [name] Primary Care Ofilia Rayon: [name] Other Clinician: Referring Sheldon Sem: Treating Twania Bujak/Extender: Atlee Abide Weeks in Treatment: 24 Edema Assessment Assessed: [Left: Yes] [Right: No] Edema: [Left: Ye] [Right: s] Calf Left: Right: Point of Measurement: 36 cm From Medial Instep 40 cm Ankle Left: Right: Point of Measurement: 13 cm From Medial Instep 27.5 cm Vascular Assessment Pulses: Dorsalis Pedis Palpable: [Left:Yes] Electronic Signature(s) Signed: 11/25/2021 4:24:10 PM By: [name] Entered By: [name] on 11/25/2021 10:16:31 -------------------------------------------------------------------------------- Multi-Disciplinary Care Plan Details Patient Name: Date of Service: [name] IG 11/25/2021 10:00 A M Medical Record Number: [id number] Patient Account Number: [id number] Date of Birth/Sex: Treating RN: [birth year] (61 y.o. [name] Primary Care Jceon Alverio: [name] Other Clinician: Referring Favian Kittleson: Treating Francisca Langenderfer/Extender: [name] in Treatment: 24 Multidisciplinary Care Plan reviewed with physician Active Inactive Electronic  Signature(s) Signed: 01/13/2022 4:28:26 PM By: [name] Signed: 07/16/2022 11:55:26 AM By: Fonnie Mu RN Previous Signature: 11/25/2021 4:24:10 PM Version By: [name] Entered By: Fonnie Mu on 01/13/2022 13:12:38 -------------------------------------------------------------------------------- Pain Assessment Details Patient Name: Date of Service: [name] IG 11/25/2021 10:00 A M Medical Record Number: [id number] Patient Account Number: [id number] Date of Birth/Sex: Treating RN: 1961-01-12 (61 y.o. [name] Primary Care Shimshon Narula: [name] Other Clinician: Referring Sydne Krahl: Treating Tauriel Scronce/Extender: [name] in Treatment: 24 Active Problems Location of Pain Severity and Description of Pain Patient Has  Paino No Site Locations Pain Management and Medication Current Pain Management: Electronic Signature(s) Signed: 11/25/2021 4:24:10 PM By: [name] Entered By: [name] on 11/25/2021 10:12:56 -------------------------------------------------------------------------------- Patient/Caregiver Education Details Patient Name: Date of Service: [name], [name] IG 2/8/2023andnbsp10:00 A M Medical Record Number: [id number] Patient Account Number: [id number] Date of Birth/Gender: Treating RN: [birth year] (61 y.o. [name] Primary Care Physician: [name] Other Clinician: Referring Physician: Treating Physician/Extender: [name] in Treatment: 24 Education Assessment Education Provided To: Patient Education Topics Provided Wound/Skin Impairment: Handouts: Skin Care Do's and Dont's Methods: Explain/Verbal Responses: Reinforcements needed Electronic Signature(s) Signed: 11/25/2021 5:26:44 PM By: Shawn Stall RN, BSN Entered By: Shawn Stall on 11/25/2021 10:36:15 -------------------------------------------------------------------------------- Wound Assessment Details Patient  Name: Date of Service: [name] IG 11/25/2021 10:00 A M Medical Record Number: [id number] Patient Account Number: [id number] Date of Birth/Sex: Treating RN: 06/09/1961 (61 y.o. [name] Primary Care Kaimani Clayson: [name] Other Clinician: Referring Landyn Lorincz: Treating Nalah Macioce/Extender: Atlee Abide Weeks in Treatment: 24 Wound Status Wound Number: 3 Primary Diabetic Wound/Ulcer of the Lower Extremity Etiology: Wound Location: Left, Plantar Foot Wound Open Wounding Event: Gradually Appeared Status: Date Acquired: 03/18/2021 Comorbid Anemia, Sleep Apnea, Congestive Heart Failure, Hypertension, Weeks Of Treatment: 24 History: Peripheral Venous Disease, Cirrhosis , Type II Diabetes, Clustered Wound: No Osteoarthritis, Neuropathy Photos Wound Measurements Length: (cm) 3.2 Width: (cm) 3.5 Depth: (cm) 1 Area: (cm) 8.796 Volume: (cm) 8.796 % Reduction in Area: -24.4% % Reduction in Volume: -1144.1% Epithelialization: Small (1-33%) Tunneling: No Undermining: Yes Starting Position (o'clock): 6 Ending Position (o'clock): 12 Maximum Distance: (cm) 0.3 Wound Description Classification: Grade 3 Wound Margin: Distinct, outline attached Exudate Amount: Large Exudate Type: Serosanguineous Exudate Color: red, brown Foul Odor After Cleansing: No Slough/Fibrino No Wound Bed Granulation Amount: Large (67-100%) Exposed Structure Granulation Quality: Pink, Pale Fascia Exposed: No Necrotic Amount: None Present (0%) Fat Layer (Subcutaneous Tissue) Exposed: Yes Tendon Exposed: No Muscle Exposed: No Joint Exposed: No Bone Exposed: No Assessment Notes Calloused Periwound Electronic Signature(s) Signed: 11/25/2021 4:24:10 PM By: [name] Entered By: [name] on 11/25/2021 10:28:21 -------------------------------------------------------------------------------- Vitals Details Patient Name: Date of Service: [name] IG 11/25/2021 10:00 A  M Medical Record Number: [id number] Patient Account Number: [id number] Date of Birth/Sex: Treating RN: [birth year] (60 y.o. [name] Primary Care Keari Miu: [name] Other Clinician: Referring Thais Silberstein: Treating Sehaj Mcenroe/Extender: Atlee Abide Weeks in Treatment: 24 Vital Signs Time Taken: 10:12 Temperature (F): 98.7 Weight (lbs): 270 Pulse (bpm): 90 Respiratory Rate (breaths/min): 20 Blood Pressure (mmHg): 131/77 Reference Range: 80 - 120 mg / dl Electronic Signature(s) Signed: 11/25/2021 4:24:10 PM By: [name] Entered By: [name] on 11/25/2021 10:12:50

## 2021-11-26 ENCOUNTER — Encounter: Payer: Self-pay | Admitting: Internal Medicine
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
## 2021-11-27 ENCOUNTER — Ambulatory Visit (INDEPENDENT_AMBULATORY_CARE_PROVIDER_SITE_OTHER): Payer: Medicare Other | Admitting: Internal Medicine

## 2021-11-27 ENCOUNTER — Other Ambulatory Visit: Payer: Self-pay

## 2021-11-27 VITALS — BP 130/74 | HR 92 | Temp 98.4°F | Ht 73.0 in

## 2021-11-27 DIAGNOSIS — L97509 Non-pressure chronic ulcer of other part of unspecified foot with unspecified severity: Secondary | ICD-10-CM

## 2021-11-27 DIAGNOSIS — R7401 Elevation of levels of liver transaminase levels: Secondary | ICD-10-CM | POA: Diagnosis not present

## 2021-11-27 DIAGNOSIS — F1010 Alcohol abuse, uncomplicated: Secondary | ICD-10-CM | POA: Diagnosis not present

## 2021-11-27 DIAGNOSIS — E11621 Type 2 diabetes mellitus with foot ulcer: Secondary | ICD-10-CM | POA: Diagnosis not present

## 2021-11-27 DIAGNOSIS — D696 Thrombocytopenia, unspecified: Secondary | ICD-10-CM

## 2021-11-27 DIAGNOSIS — I5021 Acute systolic (congestive) heart failure: Secondary | ICD-10-CM | POA: Diagnosis not present

## 2021-11-27 DIAGNOSIS — E782 Mixed hyperlipidemia: Secondary | ICD-10-CM

## 2021-11-27 LAB — CBC WITH DIFFERENTIAL/PLATELET
Basophils Absolute: 0 10*3/uL (ref 0.0–0.1)
Basophils Relative: 0.7 % (ref 0.0–3.0)
Eosinophils Absolute: 0.1 10*3/uL (ref 0.0–0.7)
Eosinophils Relative: 1.5 % (ref 0.0–5.0)
HCT: 37.9 % — ABNORMAL LOW (ref 39.0–52.0)
Hemoglobin: 12.8 g/dL — ABNORMAL LOW (ref 13.0–17.0)
Lymphocytes Relative: 27.2 % (ref 12.0–46.0)
Lymphs Abs: 1.3 10*3/uL (ref 0.7–4.0)
MCHC: 33.8 g/dL (ref 30.0–36.0)
MCV: 90.9 fl (ref 78.0–100.0)
Monocytes Absolute: 0.5 10*3/uL (ref 0.1–1.0)
Monocytes Relative: 9.7 % (ref 3.0–12.0)
Neutro Abs: 3 10*3/uL (ref 1.4–7.7)
Neutrophils Relative %: 60.9 % (ref 43.0–77.0)
Platelets: 305 10*3/uL (ref 150.0–400.0)
RBC: 4.16 Mil/uL — ABNORMAL LOW (ref 4.22–5.81)
RDW: 17.5 % — ABNORMAL HIGH (ref 11.5–15.5)
WBC: 4.9 10*3/uL (ref 4.0–10.5)

## 2021-11-27 LAB — COMPREHENSIVE METABOLIC PANEL
ALT: 35 U/L (ref 0–53)
AST: 33 U/L (ref 0–37)
Albumin: 3.6 g/dL (ref 3.5–5.2)
Alkaline Phosphatase: 156 U/L — ABNORMAL HIGH (ref 39–117)
BUN: 8 mg/dL (ref 6–23)
CO2: 27 mEq/L (ref 19–32)
Calcium: 9 mg/dL (ref 8.4–10.5)
Chloride: 100 mEq/L (ref 96–112)
Creatinine, Ser: 0.99 mg/dL (ref 0.40–1.50)
GFR: 82.89 mL/min (ref >60.00)
Glucose, Bld: 95 mg/dL (ref 70–99)
Potassium: 3.8 mEq/L (ref 3.5–5.1)
Sodium: 137 mEq/L (ref 135–145)
Total Bilirubin: 1.6 mg/dL — ABNORMAL HIGH (ref 0.2–1.2)
Total Protein: 7.9 g/dL (ref 6.0–8.3)

## 2021-11-27 LAB — MAGNESIUM: Magnesium: 1.4 mg/dL — ABNORMAL LOW (ref 1.5–2.5)

## 2021-11-27 NOTE — Patient Instructions (Addendum)
° ° ° °  Blood work was ordered.     Medications changes include :   none    Please followup in 6 months    Call GI to schedule an appointment Phone: [phone]

## 2021-11-27 NOTE — Assessment & Plan Note (Addendum)
Chronic Atorvastatin held while in the hospital due to transaminitis Continue atorvastatin 80 mg daily CMP

## 2021-11-27 NOTE — Assessment & Plan Note (Signed)
Likely secondary to chronic alcohol abuse He did experience some urethral bleeding while in the hospital, but that stopped after the IV heparin was discontinued CBC today

## 2021-11-27 NOTE — Assessment & Plan Note (Addendum)
Chronic Also related to alcoholic peripheral neuropathy-not diabetes History osteomyelitis Continued on home regimen of Augmentin FLorastar added Following with the wound care center

## 2021-11-27 NOTE — Assessment & Plan Note (Addendum)
Elevated LFTs while in the hospital There was some improvement during this hospital course Lipitor temporarily held CMP today Advise follow-up with GI, especially given the concerns of cirrhosis

## 2021-11-27 NOTE — Assessment & Plan Note (Signed)
Repleted in the hospital Check magnesium level

## 2021-11-27 NOTE — Assessment & Plan Note (Addendum)
Chronic Stressed cessation of all alcohol Taking Campral He is involved in a program that will help him find a Veterinary surgeon and will likely be doing intensive outpatient therapy

## 2021-11-27 NOTE — Assessment & Plan Note (Addendum)
Acute on chronic Likely related to not taking medications Had weight gain of 20 pounds noted on admission and was experiencing orthopnea, PND Started on IV Lasix and once euvolemic switch to oral Discharged home on Lasix 40 mg daily as needed for daily weight gain of more than 3 pounds or more than 5 pounds in 1 week, but he was under the impression he was post to be taking it twice daily and have an extra pill in the morning if he has increased swelling.  I do think he probably needs daily Lasix and advised him to continue the 40 mg twice daily.  Advised him to try to avoid that extra pill if possible so it does not hurt his kidney function He is weighing himself on a daily basis and his weight has been stable-encouraged him to continue daily weighing CBC, CMP Has cardiology follow-up scheduled

## 2021-11-29 MED ORDER — MAGNESIUM 400 MG PO TABS: 400.0000 mg | ORAL_TABLET | Freq: Every day | ORAL | 5 refills | Status: DC

## 2021-11-29 NOTE — Addendum Note (Signed)
Addended by: [name] on: 11/29/2021 10:45 AM   Modules accepted: Orders

## 2021-12-01 DIAGNOSIS — K219 Gastro-esophageal reflux disease without esophagitis: Secondary | ICD-10-CM

## 2021-12-01 DIAGNOSIS — E785 Hyperlipidemia, unspecified: Secondary | ICD-10-CM

## 2021-12-01 DIAGNOSIS — Z87891 Personal history of nicotine dependence: Secondary | ICD-10-CM

## 2021-12-01 DIAGNOSIS — E11621 Type 2 diabetes mellitus with foot ulcer: Secondary | ICD-10-CM

## 2021-12-01 DIAGNOSIS — G4700 Insomnia, unspecified: Secondary | ICD-10-CM

## 2021-12-01 DIAGNOSIS — E662 Morbid (severe) obesity with alveolar hypoventilation: Secondary | ICD-10-CM

## 2021-12-01 DIAGNOSIS — K760 Fatty (change of) liver, not elsewhere classified: Secondary | ICD-10-CM

## 2021-12-01 DIAGNOSIS — M150 Primary generalized (osteo)arthritis: Secondary | ICD-10-CM

## 2021-12-01 DIAGNOSIS — F32A Depression, unspecified: Secondary | ICD-10-CM

## 2021-12-01 DIAGNOSIS — F10239 Alcohol dependence with withdrawal, unspecified: Secondary | ICD-10-CM

## 2021-12-01 DIAGNOSIS — Z79891 Long term (current) use of opiate analgesic: Secondary | ICD-10-CM

## 2021-12-01 DIAGNOSIS — E1142 Type 2 diabetes mellitus with diabetic polyneuropathy: Secondary | ICD-10-CM

## 2021-12-01 DIAGNOSIS — G4733 Obstructive sleep apnea (adult) (pediatric): Secondary | ICD-10-CM

## 2021-12-01 DIAGNOSIS — Z9181 History of falling: Secondary | ICD-10-CM

## 2021-12-01 DIAGNOSIS — M86672 Other chronic osteomyelitis, left ankle and foot: Secondary | ICD-10-CM

## 2021-12-01 DIAGNOSIS — F419 Anxiety disorder, unspecified: Secondary | ICD-10-CM

## 2021-12-01 DIAGNOSIS — I5032 Chronic diastolic (congestive) heart failure: Secondary | ICD-10-CM

## 2021-12-01 DIAGNOSIS — I5021 Acute systolic (congestive) heart failure: Secondary | ICD-10-CM

## 2021-12-01 DIAGNOSIS — E1169 Type 2 diabetes mellitus with other specified complication: Secondary | ICD-10-CM

## 2021-12-01 DIAGNOSIS — Z6836 Body mass index (BMI) 36.0-36.9, adult: Secondary | ICD-10-CM

## 2021-12-01 DIAGNOSIS — G8929 Other chronic pain: Secondary | ICD-10-CM

## 2021-12-01 DIAGNOSIS — Z4800 Encounter for change or removal of nonsurgical wound dressing: Secondary | ICD-10-CM

## 2021-12-01 DIAGNOSIS — M5416 Radiculopathy, lumbar region: Secondary | ICD-10-CM

## 2021-12-01 DIAGNOSIS — L97429 Non-pressure chronic ulcer of left heel and midfoot with unspecified severity: Secondary | ICD-10-CM

## 2021-12-01 DIAGNOSIS — G5603 Carpal tunnel syndrome, bilateral upper limbs: Secondary | ICD-10-CM

## 2021-12-01 DIAGNOSIS — I110 Hypertensive heart disease with heart failure: Secondary | ICD-10-CM

## 2021-12-01 DIAGNOSIS — D696 Thrombocytopenia, unspecified: Secondary | ICD-10-CM

## 2021-12-02 ENCOUNTER — Encounter (HOSPITAL_BASED_OUTPATIENT_CLINIC_OR_DEPARTMENT_OTHER): Payer: Medicare Other | Admitting: Physician Assistant

## 2021-12-02 ENCOUNTER — Other Ambulatory Visit: Payer: Self-pay | Admitting: Internal Medicine

## 2021-12-03 ENCOUNTER — Ambulatory Visit: Payer: Medicare Other | Admitting: Neurology

## 2021-12-05 ENCOUNTER — Ambulatory Visit (HOSPITAL_COMMUNITY)
Admission: RE | Admit: 2021-12-05 | Discharge: 2021-12-05 | Disposition: A | Discharge status: Home / Self Care | Payer: Medicare Other | Source: Ambulatory Visit | Attending: Physician Assistant | Admitting: Physician Assistant

## 2021-12-05 DIAGNOSIS — L97529 Non-pressure chronic ulcer of other part of left foot with unspecified severity: Secondary | ICD-10-CM | POA: Diagnosis present

## 2021-12-05 MED ORDER — GADOBUTROL 1 MMOL/ML IV SOLN
10.0000 mL | Freq: Once | INTRAVENOUS | Status: AC | PRN
  Administered 2021-12-05: 10 mL via INTRAVENOUS

## 2021-12-09 ENCOUNTER — Encounter (HOSPITAL_BASED_OUTPATIENT_CLINIC_OR_DEPARTMENT_OTHER): Payer: Medicare Other | Admitting: Physician Assistant

## 2021-12-11 ENCOUNTER — Ambulatory Visit: Payer: Medicare Other | Admitting: Internal Medicine

## 2021-12-11 ENCOUNTER — Other Ambulatory Visit: Payer: Self-pay | Admitting: Internal Medicine

## 2021-12-14 ENCOUNTER — Telehealth: Payer: Self-pay

## 2021-12-14 ENCOUNTER — Ambulatory Visit: Payer: Medicare Other | Admitting: Neurology

## 2021-12-14 MED ORDER — LOSARTAN POTASSIUM 25 MG PO TABS: 25.0000 mg | ORAL_TABLET | Freq: Every day | ORAL | 1 refills | Status: DC

## 2021-12-14 MED ORDER — GABAPENTIN 300 MG PO CAPS: 300.0000 mg | ORAL_CAPSULE | Freq: Two times a day (BID) | ORAL | 1 refills | Status: DC

## 2021-12-14 MED ORDER — AMLODIPINE BESYLATE 5 MG PO TABS: 5.0000 mg | ORAL_TABLET | Freq: Every day | ORAL | 1 refills | Status: DC

## 2021-12-14 NOTE — Telephone Encounter (Signed)
Pt is requesting a refill on: gabapentin (NEURONTIN) 300 MG capsule oxyCODONE (ROXICODONE) 5 MG immediate release tablet losartan (COZAAR) 25 MG tablet amLODipine (NORVASC) 5 MG tablet       (Was prescribed while in the [facility])  Pharmacy: [id number] - [place], [place] - [address]  LOV 11/27/21  Pt Cb [phone]

## 2021-12-14 NOTE — Telephone Encounter (Signed)
Prescription sent to pharmacy.  I cannot continue to prescribe the oxycodone-I advised this was only a temporary prescription-that is why I referred him to orthopedics and I believe orthopedics referred him to pain management.  We can increase the gabapentin if he wants to see if that helps with the pain.

## 2021-12-15 MED ORDER — GABAPENTIN 300 MG PO CAPS: 300.0000 mg | ORAL_CAPSULE | Freq: Three times a day (TID) | ORAL | 5 refills | Status: DC

## 2021-12-16 ENCOUNTER — Encounter (HOSPITAL_BASED_OUTPATIENT_CLINIC_OR_DEPARTMENT_OTHER): Payer: Medicare Other | Admitting: Physician Assistant

## 2021-12-17 ENCOUNTER — Ambulatory Visit: Payer: Medicare Other | Admitting: Physician Assistant

## 2021-12-21 ENCOUNTER — Encounter: Payer: Self-pay | Admitting: *Deleted

## 2021-12-21 ENCOUNTER — Telehealth: Payer: Medicare Other

## 2021-12-21 ENCOUNTER — Telehealth: Payer: Self-pay | Admitting: *Deleted

## 2021-12-21 NOTE — Telephone Encounter (Addendum)
?  Chronic Care Management  ? ?Follow Up Note ? ? ?12/21/2021 ?Name: [name] MRN: [id number] DOB: [birth year] ? ?Referred by: [name], MD ?Reason for referral : Chronic Care Management (CCM RN CM Follow Up Telephone Visit- Unsuccessful Outreach; ETOH; CHF) ? ?An unsuccessful telephone outreach was attempted today. The patient was referred to the case management team for assistance with care management and care coordination.  ? ?Follow Up Plan:  ?A HIPPA compliant phone message was left for the patient providing contact information and requesting a return call ?Will place request with scheduling care guide to contact patient to re-schedule today's missed CCM RN follow up telephone appointment if I do not hear back from patient by end of day ?Addendum: patient returned call late afternoon 12/21/21; requested call back- call placed to patient 12/22/21-- please see documentation note from 12/22/21 ? ?Caryl Pina, RN, BSN, CCRN Alumnus ?CCM Clinic RN Care Coordination- [facility]or Ramp ?([phone]: direct office ? ? ?

## 2021-12-22 ENCOUNTER — Ambulatory Visit (INDEPENDENT_AMBULATORY_CARE_PROVIDER_SITE_OTHER): Payer: Medicare Other | Admitting: *Deleted

## 2021-12-22 DIAGNOSIS — F1010 Alcohol abuse, uncomplicated: Secondary | ICD-10-CM

## 2021-12-22 DIAGNOSIS — L97509 Non-pressure chronic ulcer of other part of unspecified foot with unspecified severity: Secondary | ICD-10-CM

## 2021-12-22 DIAGNOSIS — I5032 Chronic diastolic (congestive) heart failure: Secondary | ICD-10-CM

## 2021-12-22 NOTE — Patient Instructions (Signed)
Visit Information ? ?[name], thank you for taking time to talk with me today. Please don't hesitate to contact me if I can be of assistance to you before our next scheduled telephone appointment. ? ?Below are the goals we discussed today:  ?Patient Self-Care Activities: ?Patient Furious will: ?Take medications as prescribed ?Attend all scheduled provider appointments ?Call pharmacy for medication refills ?Call provider office for new concerns or questions ?Continue to use assistive devices as indicated to prevent falls as his (L) foot wound continues to heal ?Not consume alcohol ?Continue to attend wound clinic visits ?Continue monitoring and writing down on paper his weights at home, 3-4 times per week: the weight you reported today was: 280 lbs ?Continue efforts to follow a heart healthy, low salt, low-carbohydrate diet ?Continue working with home health team for nursing wound check/ care and PT ? ?Our next scheduled telephone follow up visit/ appointment is scheduled on:   Monday, Feb 22, 2022 at 3:15 pm- This is a PHONE CALL appointment ? ?If you need to cancel or re-schedule our visit, please call [phone] and our care guide team will be happy to assist you. ?  ?I look forward to hearing about your progress. ?  ?Caryl Pina, RN, BSN, CCRN Alumnus ?[facility] Clinic RN Care Coordination- [facility] ?[phone]: direct office ? ?If you are experiencing a Mental Health or Behavioral Health Crisis or need someone to talk to, please  ?call the Suicide and Crisis Lifeline: 988 ?call the [place] National Suicide Prevention Lifeline: 709-366-6077 or TTY: 916 350 7612 TTY 959-733-7260) to talk to a trained counselor ?call 1-800-273-TALK (toll free, 24 hour hotline) ?go to [facility] [address], [place] [phone]) ?call 911  ? ?Patient verbalizes understanding of instructions and care plan provided today and agrees to view in MyChart. Active MyChart status  confirmed with patient ? ?Fall Prevention in the Home, Adult ?Falls can cause injuries and can happen to people of all ages. There are many things you can do to make your home safe and to help prevent falls. Ask for help when making these changes. ?What actions can I take to prevent falls? ?General Instructions ?Use good lighting in all rooms. Replace any light bulbs that burn out. ?Turn on the lights in dark areas. Use night-lights. ?Keep items that you use often in easy-to-reach places. Lower the shelves around your home if needed. ?Set up your furniture so you have a clear path. Avoid moving your furniture around. ?Do not have throw rugs or other things on the floor that can make you trip. ?Avoid walking on wet floors. ?If any of your floors are uneven, fix them. ?Add color or contrast paint or tape to clearly mark and help you see: ?Grab bars or handrails. ?First and last steps of staircases. ?Where the edge of each step is. ?If you use a stepladder: ?Make sure that it is fully opened. Do not climb a closed stepladder. ?Make sure the sides of the stepladder are locked in place. ?Ask someone to hold the stepladder while you use it. ?Know where your pets are when moving through your home. ?What can I do in the bathroom? ?  ?Keep the floor dry. Clean up any water on the floor right away. ?Remove soap buildup in the tub or shower. ?Use nonskid mats or decals on the floor of the tub or shower. ?Attach bath mats securely with double-sided, nonslip rug tape. ?If you need to sit down in the shower, use a  plastic, nonslip stool. ?Install grab bars by the toilet and in the tub and shower. Do not use towel bars as grab bars. ?What can I do in the bedroom? ?Make sure that you have a light by your bed that is easy to reach. ?Do not use any sheets or blankets for your bed that hang to the floor. ?Have a firm chair with side arms that you can use for support when you get dressed. ?What can I do in the kitchen? ?Clean up any  spills right away. ?If you need to reach something above you, use a step stool with a grab bar. ?Keep electrical cords out of the way. ?Do not use floor polish or wax that makes floors slippery. ?What can I do with my stairs? ?Do not leave any items on the stairs. ?Make sure that you have a light switch at the top and the bottom of the stairs. ?Make sure that there are handrails on both sides of the stairs. Fix handrails that are broken or loose. ?Install nonslip stair treads on all your stairs. ?Avoid having throw rugs at the top or bottom of the stairs. ?Choose a carpet that does not hide the edge of the steps on the stairs. ?Check carpeting to make sure that it is firmly attached to the stairs. Fix carpet that is loose or worn. ?What can I do on the outside of my home? ?Use bright outdoor lighting. ?Fix the edges of walkways and driveways and fix any cracks. ?Remove anything that might make you trip as you walk through a door, such as a raised step or threshold. ?Trim any bushes or trees on paths to your home. ?Check to see if handrails are loose or broken and that both sides of all steps have handrails. ?Install guardrails along the edges of any raised decks and porches. ?Clear paths of anything that can make you trip, such as tools or rocks. ?Have leaves, snow, or ice cleared regularly. ?Use sand or salt on paths during winter. ?Clean up any spills in your garage right away. This includes grease or oil spills. ?What other actions can I take? ?Wear shoes that: ?Have a low heel. Do not wear high heels. ?Have rubber bottoms. ?Feel good on your feet and fit well. ?Are closed at the toe. Do not wear open-toe sandals. ?Use tools that help you move around if needed. These include: ?Canes. ?Walkers. ?Scooters. ?Crutches. ?Review your medicines with your doctor. Some medicines can make you feel dizzy. This can increase your chance of falling. ?Ask your doctor what else you can do to help prevent falls. ?Where to find  more information ?Centers for Disease Control and Prevention, STEADI: FootballExhibition.com.br ?[employer] on Aging: https://walker.com/ ?Contact a doctor if: ?You are afraid of falling at home. ?You feel weak, drowsy, or dizzy at home. ?You fall at home. ?Summary ?There are many simple things that you can do to make your home safe and to help prevent falls. ?Ways to make your home safe include removing things that can make you trip and installing grab bars in the bathroom. ?Ask for help when making these changes in your home. ?This information is not intended to replace advice given to you by your health care provider. Make sure you discuss any questions you have with your health care provider. ?Document Revised: 05/07/2020 Document Reviewed: 05/07/2020 ?Elsevier Patient Education ? 2022 Elsevier Inc. ? ? ?
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
## 2022-01-06 ENCOUNTER — Other Ambulatory Visit: Payer: Self-pay

## 2022-01-06 ENCOUNTER — Ambulatory Visit (INDEPENDENT_AMBULATORY_CARE_PROVIDER_SITE_OTHER): Payer: Medicare Other | Admitting: Physician Assistant

## 2022-01-06 VITALS — BP 128/80 | HR 102 | Ht 73.0 in | Wt 304.0 lb

## 2022-01-06 DIAGNOSIS — R079 Chest pain, unspecified: Secondary | ICD-10-CM

## 2022-01-06 DIAGNOSIS — I5032 Chronic diastolic (congestive) heart failure: Secondary | ICD-10-CM | POA: Diagnosis not present

## 2022-01-06 DIAGNOSIS — L97429 Non-pressure chronic ulcer of left heel and midfoot with unspecified severity: Secondary | ICD-10-CM | POA: Diagnosis not present

## 2022-01-06 DIAGNOSIS — I10 Essential (primary) hypertension: Secondary | ICD-10-CM | POA: Diagnosis not present

## 2022-01-06 DIAGNOSIS — F1010 Alcohol abuse, uncomplicated: Secondary | ICD-10-CM
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
## 2022-01-06 NOTE — Patient Instructions (Signed)
Medication Instructions:  ?No changes ?*If you need a refill on your cardiac medications before your next appointment, please call your pharmacy* ? ? ?Lab Work: ?None ?If you have labs (blood work) drawn today and your tests are completely normal, you will receive your results only by: ?MyChart Message (if you have MyChart) OR ?A paper copy in the mail ?If you have any lab test that is abnormal or we need to change your treatment, we will call you to review the results. ? ? ?Testing/Procedures: ?None ? ? ?Follow-Up: ?At [facility], you and your health needs are our priority.  As part of our continuing mission to provide you with exceptional heart care, we have created designated Provider Care Teams.  These Care Teams include your primary Cardiologist (physician) and Advanced Practice Providers (APPs -  Physician Assistants and Nurse Practitioners) who all work together to provide you with the care you need, when you need it. ? ?We recommend signing up for the patient portal called "MyChart".  Sign up information is provided on this After Visit Summary.  MyChart is used to connect with patients for Virtual Visits (Telemedicine).  Patients are able to view lab/test results, encounter notes, upcoming appointments, etc.  Non-urgent messages can be sent to your provider as well.   ?To learn more about what you can do with MyChart, go to [email or web address].   ? ?Your next appointment:   ?3 week(s) ? ?The format for your next appointment:   ?In Person ? ?Provider:   ?Azalee Course, PA-C    Then, Little Ishikawa, MD will plan to see you again in 3 week(s).  ? ? ? ?

## 2022-01-07 ENCOUNTER — Other Ambulatory Visit: Payer: Self-pay | Admitting: Internal Medicine

## 2022-01-07 ENCOUNTER — Telehealth: Payer: Self-pay | Admitting: Cardiology

## 2022-01-07 NOTE — Telephone Encounter (Signed)
Patient asking that [name] give him a call back to discuss visit on yesterday. Please advise ?

## 2022-01-07 NOTE — Telephone Encounter (Signed)
[name], can you set up cardiac catheterization for this patient? ? ?I discussed the case with Dr. Bjorn Pippin yesterday who recommended either cardiac cath or medical management. He opted for cardiac catheterization. ? ?Risk and benefit of procedure explained to the patient who display clear understanding and agree to proceed. Discussed with patient possible procedural risk include bleeding, vascular injury, renal injury, arrythmia, MI, stroke and loss of limb or life. ? ?He will need standard precath lab work. There are no days that is not good for him to do cath. His primary cardiologist is Dr. Bjorn Pippin who does not do cath himself, so any interventionalist should be fine.  ?

## 2022-01-07 NOTE — Telephone Encounter (Signed)
Left him a message. Will call him again in a few min.  ?

## 2022-01-07 NOTE — Telephone Encounter (Signed)
Patient wants to know about a medication that was prn and you told him to take daily. He also wants to have the cardiac cath. ?

## 2022-01-08 ENCOUNTER — Other Ambulatory Visit: Payer: Self-pay

## 2022-01-08 ENCOUNTER — Telehealth: Payer: Self-pay

## 2022-01-08 ENCOUNTER — Encounter: Payer: Self-pay | Admitting: Physician Assistant

## 2022-01-08 DIAGNOSIS — Z01818 Encounter for other preprocedural examination: Secondary | ICD-10-CM

## 2022-01-08 NOTE — Telephone Encounter (Signed)
Patient returned called.    ?Patient states Azalee Course was concerned about doing the cardiac cath as he had a foot ulcer.  He wants to know if it's okay to proceed with the catheterization.  ?

## 2022-01-08 NOTE — Telephone Encounter (Signed)
Called patient he stated he wanted to ask [name] if ok to have cardiac cath with foot ulcer.Advised it is after 5:00 pm.I will send message to her.She will call you back next week. ?

## 2022-01-08 NOTE — Telephone Encounter (Signed)
Called and left a voice message for patient to give me a call back at the office to get him scheduled for his cardiac catheterization. I will call patient again.  ?

## 2022-01-08 NOTE — Telephone Encounter (Signed)
Called and left a voice message for patient to give me a call back at the office to get him scheduled for his cardiac catheterization. I will call patient again.  ?

## 2022-01-08 NOTE — Progress Notes (Signed)
This encounter was created in error - please disregard.

## 2022-01-08 NOTE — Telephone Encounter (Signed)
Returned call to patient he informed me that he has an upcoming appointment with the wound doctor for his foot and asked if I can asked Wynema Birch if he is still concerned about the wound. Informed patient that I would speak with Wynema Birch and return his call. He thanked me for calling back in a timely manner.  ?

## 2022-01-08 NOTE — Telephone Encounter (Signed)
Pt calling back and requesting to speak with Ardine Bjork ?

## 2022-01-10 ENCOUNTER — Encounter: Payer: Self-pay | Admitting: Internal Medicine

## 2022-01-10 NOTE — Progress Notes (Deleted)
? ? ?Subjective:  ? ? Patient ID: [name], male    DOB: 1960-12-02, 61 y.o.   MRN: [id number] ? ?This visit occurred during the SARS-CoV-2 public health emergency.  Safety protocols were in place, including screening questions prior to the visit, additional usage of staff PPE, and extensive cleaning of exam room while observing appropriate contact time as indicated for disinfecting solutions. ? ? ? ?HPI ?[facility] here for No chief complaint on file. ? ? ? ?Referral to neuro -  ? ? ? ?Medications and allergies reviewed with patient and updated if appropriate. ? ?Current Outpatient Medications on File Prior to Visit  ?Medication Sig Dispense Refill  ? acamprosate (CAMPRAL) 333 MG tablet Take 333 mg by mouth 2 (two) times daily.    ? acetaminophen (TYLENOL) 325 MG tablet Take 2 tablets (650 mg total) by mouth every 4 (four) hours as needed for headache or mild pain.    ? amLODipine (NORVASC) 5 MG tablet Take 1 tablet (5 mg total) by mouth daily. 90 tablet 1  ? amoxicillin-clavulanate (AUGMENTIN) 875-125 MG tablet Take 1 tablet by mouth 2 (two) times daily. 60 tablet 11  ? aspirin EC 81 MG EC tablet Take 1 tablet (81 mg total) by mouth daily. Swallow whole. 30 tablet 11  ? atorvastatin (LIPITOR) 80 MG tablet Take 1 tablet (80 mg total) by mouth daily. 30 tablet 1  ? diclofenac (VOLTAREN) 75 MG EC tablet Take 75 mg by mouth 2 (two) times daily.    ? folic acid (FOLVITE) 1 MG tablet TAKE 1 TABLET BY MOUTH EVERY DAY 90 tablet 0  ? furosemide (LASIX) 40 MG tablet Take 1 tablet (40 mg total) by mouth daily as needed. Weight yourself daily.  Take 40 mg daily as needed if you gain > 3 pounds in a day or > 5 pounds in a week, and take for 2 to 3 days. 30 tablet 1  ? gabapentin (NEURONTIN) 300 MG capsule Take 1-2 capsules (300-600 mg total) by mouth 3 (three) times daily. 180 capsule 5  ? LORazepam (ATIVAN) 1 MG tablet Take 1 mg by mouth daily as needed for anxiety.    ? losartan (COZAAR) 25 MG tablet Take 1 tablet (25 mg  total) by mouth daily. 90 tablet 1  ? Magnesium 400 MG TABS Take 400 mg by mouth daily. 30 tablet 5  ? methocarbamol (ROBAXIN) 500 MG tablet TAKE 1 TABLET BY MOUTH EVERY 8 HOURS AS NEEDED FOR MUSCLE SPASMS. 90 tablet 0  ? metoprolol succinate (TOPROL-XL) 25 MG 24 hr tablet Take 1 tablet (25 mg total) by mouth daily. 30 tablet 1  ? nitroGLYCERIN (NITROSTAT) 0.4 MG SL tablet Place 1 tablet (0.4 mg total) under the tongue every 5 (five) minutes x 3 doses as needed for chest pain. 15 tablet 1  ? pantoprazole (PROTONIX) 40 MG tablet Take 1 tablet (40 mg total) by mouth daily. 30 tablet 1  ? potassium chloride SA (KLOR-CON M20) 20 MEQ tablet Take 3 tablets (60 mEq total) by mouth daily as needed. Take when you have to take Lasix as needed. 30 tablet 1  ? saccharomyces boulardii (FLORASTOR) 250 MG capsule Take 1 capsule (250 mg total) by mouth 2 (two) times daily.    ? traZODone (DESYREL) 50 MG tablet TAKE 1-2 TABLETS (50-100 MG TOTAL) BY MOUTH AT BEDTIME AS NEEDED FOR SLEEP. 180 tablet 1  ? vitamin B-12 (CYANOCOBALAMIN) 100 MCG tablet Take 100 mcg by mouth daily.    ? ?No  current facility-administered medications on file prior to visit.  ? ? ?Review of Systems ? ?   ?Objective:  ?There were no vitals filed for this visit. ?BP Readings from Last 3 Encounters:  ?01/06/22 128/80  ?11/27/21 130/74  ?11/19/21 (!) 152/90  ? ?Wt Readings from Last 3 Encounters:  ?01/06/22 (!) 304 lb (137.9 kg)  ?11/18/21 283 lb 4.7 oz (128.5 kg)  ?09/21/21 251 lb (113.9 kg)  ? ?There is no height or weight on file to calculate BMI. ? ?  ?Physical Exam ?   ? ? ? ? ? ?Assessment & Plan:  ? ? ?See Problem List for Assessment and Plan of chronic medical problems.  ? ? ? ? ?

## 2022-01-11 ENCOUNTER — Ambulatory Visit: Payer: Medicare Other | Admitting: Internal Medicine

## 2022-01-13 ENCOUNTER — Telehealth: Payer: Self-pay | Admitting: Cardiology

## 2022-01-13 ENCOUNTER — Encounter (HOSPITAL_BASED_OUTPATIENT_CLINIC_OR_DEPARTMENT_OTHER): Payer: Medicare Other | Admitting: Physician Assistant

## 2022-01-13 NOTE — Telephone Encounter (Signed)
Patient states he has an ulcer on his foot. He says he had an appointment scheduled for the wound center, but missed it. He says he is calling to inform he will be having it looked at with his PCP, but that appointment is not until the day before his procedure 4/4.  ?

## 2022-01-13 NOTE — Telephone Encounter (Signed)
Advised the pt that I will forward his message to Azalee Course PA and his nurse for review prior to his cath.  ?

## 2022-01-13 NOTE — Telephone Encounter (Signed)
Hi. Dr. Bjorn Pippin, patient was scheduled for the cath on 4/4 due to recent chest pain and elevated troponin during recent hospitalization. He still has a open ulcer on his foot, it drains clear fluid but no bleeding. He was supposed to be seen by wound care center today but he missed his appt. He described it as a quarter sized open ulcer with drainage. He is not anticipating any upcoming surgery. The plan was to let the wound heal by itself. Can he still undergo cath as long as the wound does not bleed? ?

## 2022-01-14 NOTE — Telephone Encounter (Signed)
I think should be OK in absence of bleeding, infection or need for surgery ?

## 2022-01-16 LAB — BASIC METABOLIC PANEL
BUN/Creatinine Ratio: 11 (ref 10–24)
BUN: 10 mg/dL (ref 8–27)
CO2: 23 mmol/L (ref 20–29)
Calcium: 9.2 mg/dL (ref 8.6–10.2)
Chloride: 89 mmol/L — ABNORMAL LOW (ref 96–106)
Creatinine, Ser: 0.95 mg/dL (ref 0.76–1.27)
Glucose: 101 mg/dL — ABNORMAL HIGH (ref 70–99)
Potassium: 3.8 mmol/L (ref 3.5–5.2)
Sodium: 138 mmol/L (ref 134–144)
eGFR: 92 mL/min/{1.73_m2} (ref >59)

## 2022-01-16 LAB — CBC
Hematocrit: 33 % — ABNORMAL LOW (ref 37.5–51.0)
Hemoglobin: 11.8 g/dL — ABNORMAL LOW (ref 13.0–17.7)
MCH: 32.1 pg (ref 26.6–33.0)
MCHC: 35.8 g/dL — ABNORMAL HIGH (ref 31.5–35.7)
MCV: 90 fL (ref 79–97)
Platelets: 213 10*3/uL (ref 150–450)
RBC: 3.68 x10E6/uL — ABNORMAL LOW (ref 4.14–5.80)
RDW: 15.4 % (ref 11.6–15.4)
WBC: 5.4 10*3/uL (ref 3.4–10.8)

## 2022-01-17 NOTE — Progress Notes (Signed)
? ? ?Subjective:  ? ? Patient ID: [name], male    DOB: May 28, 1961, 61 y.o.   MRN: [id number] ? ?This visit occurred during the SARS-CoV-2 public health emergency.  Safety protocols were in place, including screening questions prior to the visit, additional usage of staff PPE, and extensive cleaning of exam room while observing appropriate contact time as indicated for disinfecting solutions. ? ? ? ?HPI ?Jefferey is here for  ?Chief Complaint  ?Patient presents with  ? Referral  ?  Referral for back pain  ? ? ? ?Referral to neuro -  he continues to have chronic back pain.   ? ?Back pain more concentrated in right upper buttock.  In am gets a tingling, electric shock in upper medial thigh. Occasionally pain in right lower back.  The leg feels weak especially in the morning.  Certain positions and movement may make it worse.  Movement in general helps.   ? ?He said has seen orthopedics.  He is interested in seeing neurology. ? ? ? ?Medications and allergies reviewed with patient and updated if appropriate. ? ?Current Outpatient Medications on File Prior to Visit  ?Medication Sig Dispense Refill  ? acamprosate (CAMPRAL) 333 MG tablet Take 333 mg by mouth 2 (two) times daily.    ? acetaminophen (TYLENOL) 325 MG tablet Take 2 tablets (650 mg total) by mouth every 4 (four) hours as needed for headache or mild pain.    ? amLODipine (NORVASC) 5 MG tablet Take 5 mg by mouth daily.    ? amoxicillin-clavulanate (AUGMENTIN) 875-125 MG tablet Take 1 tablet by mouth 2 (two) times daily. 60 tablet 11  ? atorvastatin (LIPITOR) 80 MG tablet Take 1 tablet (80 mg total) by mouth daily. 30 tablet 1  ? diclofenac (VOLTAREN) 75 MG EC tablet Take 75 mg by mouth 2 (two) times daily.    ? folic acid (FOLVITE) 1 MG tablet TAKE 1 TABLET BY MOUTH EVERY DAY 90 tablet 0  ? furosemide (LASIX) 40 MG tablet Take 1 tablet (40 mg total) by mouth daily as needed. Weight yourself daily.  Take 40 mg daily as needed if you gain > 3 pounds in a day or >  5 pounds in a week, and take for 2 to 3 days. (Patient taking differently: Take 40-80 mg by mouth See admin instructions. Weight yourself daily. 80 mg in the morning, 40 mg in the evening) 30 tablet 1  ? gabapentin (NEURONTIN) 300 MG capsule Take 1-2 capsules (300-600 mg total) by mouth 3 (three) times daily. (Patient taking differently: Take 600 mg by mouth 2 (two) times daily.) 180 capsule 5  ? losartan (COZAAR) 25 MG tablet Take 1 tablet (25 mg total) by mouth daily. 90 tablet 1  ? Magnesium 400 MG TABS Take 400 mg by mouth daily. (Patient taking differently: Take 400 mg by mouth in the morning and at bedtime.) 30 tablet 5  ? magnesium oxide (MAG-OX) 400 (240 Mg) MG tablet Take 1 tablet by mouth daily.    ? methocarbamol (ROBAXIN) 500 MG tablet TAKE 1 TABLET BY MOUTH EVERY 8 HOURS AS NEEDED FOR MUSCLE SPASMS. 90 tablet 0  ? metoprolol succinate (TOPROL-XL) 25 MG 24 hr tablet Take 1 tablet (25 mg total) by mouth daily. 30 tablet 1  ? nitroGLYCERIN (NITROSTAT) 0.4 MG SL tablet Place 1 tablet (0.4 mg total) under the tongue every 5 (five) minutes x 3 doses as needed for chest pain. 15 tablet 1  ? pantoprazole (PROTONIX) 40 MG tablet  Take 1 tablet (40 mg total) by mouth daily. 30 tablet 1  ? potassium chloride SA (KLOR-CON M) 20 MEQ tablet Take 60 mEq by mouth 2 (two) times daily.    ? saccharomyces boulardii (FLORASTOR) 250 MG capsule Take 1 capsule (250 mg total) by mouth 2 (two) times daily. (Patient taking differently: Take 250 mg by mouth daily.)    ? sulfamethoxazole-trimethoprim (BACTRIM DS) 800-160 MG tablet Take 1 tablet by mouth 2 (two) times daily.    ? thiamine (VITAMIN B-1) 100 MG tablet Take 100 mg by mouth daily.    ? traZODone (DESYREL) 50 MG tablet TAKE 1-2 TABLETS (50-100 MG TOTAL) BY MOUTH AT BEDTIME AS NEEDED FOR SLEEP. 180 tablet 1  ? vitamin B-12 (CYANOCOBALAMIN) 100 MCG tablet Take 100 mcg by mouth daily.    ? aspirin EC 81 MG EC tablet Take 1 tablet (81 mg total) by mouth daily. Swallow whole.  (Patient not taking: Reported on 01/18/2022) 30 tablet 11  ? ?No current facility-administered medications on file prior to visit.  ? ? ?Review of Systems ? ?   ?Objective:  ? ?Vitals:  ? 01/18/22 1321  ?BP: 138/80  ?Pulse: (!) 102  ?Temp: 98.2 ?F (36.8 ?C)  ?SpO2: 97%  ? ?BP Readings from Last 3 Encounters:  ?01/18/22 138/80  ?01/06/22 128/80  ?11/27/21 130/74  ? ?Wt Readings from Last 3 Encounters:  ?01/18/22 286 lb (129.7 kg)  ?01/06/22 (!) 304 lb (137.9 kg)  ?11/18/21 283 lb 4.7 oz (128.5 kg)  ? ?Body mass index is 37.73 kg/m?. ? ?  ?Physical Exam ?Constitutional:   ?   General: He is not in acute distress. ?HENT:  ?   Head: Normocephalic.  ?Musculoskeletal:     ?   General: Tenderness (Lumbar spine and right lower back with palpation) present. No deformity.  ?   Right lower leg: Edema present.  ?   Left lower leg: Edema present.  ?Skin: ?   General: Skin is warm and dry.  ?Neurological:  ?   Mental Status: He is alert.  ?   Sensory: Sensory deficit (Bilateral lower extremities from chronic neuropathy) present.  ?   Gait: Gait abnormal.  ? ?   ? ? ? ? ? ?Assessment & Plan:  ? ? ?See Problem List for Assessment and Plan of chronic medical problems.  ? ? ? ? ?

## 2022-01-18 ENCOUNTER — Telehealth: Payer: Self-pay | Admitting: *Deleted

## 2022-01-18 ENCOUNTER — Ambulatory Visit (INDEPENDENT_AMBULATORY_CARE_PROVIDER_SITE_OTHER): Payer: Medicare Other | Admitting: Internal Medicine

## 2022-01-18 ENCOUNTER — Telehealth: Payer: Self-pay | Admitting: Internal Medicine

## 2022-01-18 ENCOUNTER — Encounter: Payer: Self-pay | Admitting: Internal Medicine

## 2022-01-18 VITALS — BP 138/80 | HR 102 | Temp 98.2°F | Ht 73.0 in | Wt 286.0 lb

## 2022-01-18 DIAGNOSIS — M5441 Lumbago with sciatica, right side: Secondary | ICD-10-CM

## 2022-01-18 DIAGNOSIS — R931 Abnormal findings on diagnostic imaging of heart and coronary circulation: Secondary | ICD-10-CM | POA: Diagnosis present

## 2022-01-18 DIAGNOSIS — I259 Chronic ischemic heart disease, unspecified: Secondary | ICD-10-CM | POA: Clinically undetermined

## 2022-01-18 DIAGNOSIS — G8929 Other chronic pain: Secondary | ICD-10-CM | POA: Insufficient documentation

## 2022-01-18 NOTE — Telephone Encounter (Signed)
Cardiac Catheterization scheduled at [facility] for: Tuesday January 19, 2022 10:30 AM ?Arrival time and place: [facility] Main Entrance A at: 8:30 AM ? ? ?No solid food after midnight prior to cath, clear liquids until 5 AM day of procedure. ? ?Medication instructions: ?-Hold: ? Lasix-AM of procedure-(KCl-currently on hold). ?-Except hold medications usual morning medications can be taken with sips of water including aspirin. ? ?Confirmed patient has responsible adult to drive home post procedure and be with patient first 24 hours after arriving home. ? ?Patient reports no new symptoms concerning for COVID-19/no exposure to COVID-19 in the past 10 days. ? ?Reviewed procedure instructions with patient. ?

## 2022-01-18 NOTE — Telephone Encounter (Signed)
Home Health verbal orders-caller/Agency: [facility] ? ?Callback number: [phone] Secure vm ? ?Requesting OT/PT/Skilled nursing/Social Work/Speech: PT ? ?Frequency: 1w5 ?

## 2022-01-18 NOTE — Patient Instructions (Addendum)
? ? ? ?  Medications changes include :  none  ? ? ? ?A referral was ordered for neurology.     Someone from that office will call you to schedule an appointment.  ? ? ?

## 2022-01-18 NOTE — Assessment & Plan Note (Signed)
Subacute ?Has been going on for a while ?Has seen orthopedics ?Would like to see neurology-referred today ?Recommended that he see sports medicine-he most likely will do this, but has cardiac catheterization tomorrow and is not sure what his schedule be like for the rest of the week so he will let me know when and if he wants a referral ?Taking methocarbamol as needed-he is not sure if this helps, also taking gabapentin 600 mg twice daily-continue both of these ?We will avoid future narcotics given his history of alcohol abuse-currently in remission ?

## 2022-01-19 ENCOUNTER — Other Ambulatory Visit: Payer: Self-pay

## 2022-01-19 ENCOUNTER — Encounter (HOSPITAL_COMMUNITY)
Admission: RE | Disposition: A | Discharge status: Home / Self Care | Payer: Self-pay | Source: Home / Self Care | Attending: Cardiology

## 2022-01-19 ENCOUNTER — Ambulatory Visit (HOSPITAL_COMMUNITY)
Admission: RE | Admit: 2022-01-19 | Discharge: 2022-01-19 | Disposition: A | Discharge status: Home / Self Care | Payer: Medicare Other | Attending: Cardiology | Admitting: Cardiology

## 2022-01-19 DIAGNOSIS — I2510 Atherosclerotic heart disease of native coronary artery without angina pectoris: Secondary | ICD-10-CM | POA: Diagnosis not present

## 2022-01-19 DIAGNOSIS — I110 Hypertensive heart disease with heart failure: Secondary | ICD-10-CM | POA: Insufficient documentation

## 2022-01-19 DIAGNOSIS — R0609 Other forms of dyspnea: Secondary | ICD-10-CM | POA: Diagnosis not present

## 2022-01-19 DIAGNOSIS — R079 Chest pain, unspecified: Secondary | ICD-10-CM | POA: Diagnosis present

## 2022-01-19 DIAGNOSIS — I5032 Chronic diastolic (congestive) heart failure: Secondary | ICD-10-CM | POA: Insufficient documentation

## 2022-01-19 DIAGNOSIS — Z87891 Personal history of nicotine dependence: Secondary | ICD-10-CM | POA: Diagnosis not present

## 2022-01-19 DIAGNOSIS — G4733 Obstructive sleep apnea (adult) (pediatric): Secondary | ICD-10-CM | POA: Diagnosis not present

## 2022-01-19 DIAGNOSIS — E11621 Type 2 diabetes mellitus with foot ulcer: Secondary | ICD-10-CM | POA: Diagnosis not present

## 2022-01-19 DIAGNOSIS — F1010 Alcohol abuse, uncomplicated: Secondary | ICD-10-CM | POA: Diagnosis not present

## 2022-01-19 DIAGNOSIS — Z6841 Body Mass Index (BMI) 40.0 and over, adult: Secondary | ICD-10-CM | POA: Insufficient documentation

## 2022-01-19 DIAGNOSIS — R931 Abnormal findings on diagnostic imaging of heart and coronary circulation: Secondary | ICD-10-CM

## 2022-01-19 DIAGNOSIS — L97429 Non-pressure chronic ulcer of left heel and midfoot with unspecified severity: Secondary | ICD-10-CM | POA: Insufficient documentation

## 2022-01-19 DIAGNOSIS — Z7982 Long term (current) use of aspirin: Secondary | ICD-10-CM | POA: Insufficient documentation

## 2022-01-19 DIAGNOSIS — I259 Chronic ischemic heart disease, unspecified: Secondary | ICD-10-CM

## 2022-01-19 DIAGNOSIS — R7989 Other specified abnormal findings of blood chemistry: Secondary | ICD-10-CM | POA: Diagnosis present

## 2022-01-19 DIAGNOSIS — I5021 Acute systolic (congestive) heart failure: Secondary | ICD-10-CM

## 2022-01-19 DIAGNOSIS — Z79899 Other long term (current) drug therapy: Secondary | ICD-10-CM | POA: Insufficient documentation

## 2022-01-19 DIAGNOSIS — R778 Other specified abnormalities of plasma proteins: Secondary | ICD-10-CM | POA: Diagnosis present

## 2022-01-19 HISTORY — PX: LEFT HEART CATH AND CORONARY ANGIOGRAPHY: CATH118249

## 2022-01-19 SURGERY — LEFT HEART CATH AND CORONARY ANGIOGRAPHY
Anesthesia: LOCAL

## 2022-01-19 MED ORDER — VERAPAMIL HCL 2.5 MG/ML IV SOLN
INTRAVENOUS | Status: AC
  Filled 2022-01-19: qty 2

## 2022-01-19 MED ORDER — MIDAZOLAM HCL 2 MG/2ML IJ SOLN
INTRAMUSCULAR | Status: AC
  Filled 2022-01-19: qty 2

## 2022-01-19 MED ORDER — LIDOCAINE HCL (PF) 1 % IJ SOLN
INTRAMUSCULAR | Status: AC
  Filled 2022-01-19: qty 30

## 2022-01-19 MED ORDER — HEPARIN SODIUM (PORCINE) 1000 UNIT/ML IJ SOLN
INTRAMUSCULAR | Status: AC
  Filled 2022-01-19: qty 10

## 2022-01-19 MED ORDER — FENTANYL CITRATE (PF) 100 MCG/2ML IJ SOLN
INTRAMUSCULAR | Status: AC
  Filled 2022-01-19: qty 2

## 2022-01-19 MED ORDER — SODIUM CHLORIDE 0.9% FLUSH: 3.0000 mL | Freq: Two times a day (BID) | INTRAVENOUS | Status: DC

## 2022-01-19 MED ORDER — SODIUM CHLORIDE 0.9 % WEIGHT BASED INFUSION: 1.0000 mL/kg/h | INTRAVENOUS | Status: DC

## 2022-01-19 MED ORDER — HEPARIN (PORCINE) IN NACL 1000-0.9 UT/500ML-% IV SOLN
INTRAVENOUS | Status: AC
  Filled 2022-01-19: qty 1000

## 2022-01-19 MED ORDER — SODIUM CHLORIDE 0.9 % IV SOLN
250.0000 mL | INTRAVENOUS | Status: DC | PRN
Start: 2022-01-20 — End: 2022-01-19

## 2022-01-19 MED ORDER — SODIUM CHLORIDE 0.9% FLUSH: 3.0000 mL | INTRAVENOUS | Status: DC | PRN

## 2022-01-19 MED ORDER — LABETALOL HCL 5 MG/ML IV SOLN: 10.0000 mg | INTRAVENOUS | Status: DC | PRN

## 2022-01-19 MED ORDER — SODIUM CHLORIDE 0.9 % IV SOLN: 250.0000 mL | INTRAVENOUS | Status: DC | PRN

## 2022-01-19 MED ORDER — HEPARIN (PORCINE) IN NACL 1000-0.9 UT/500ML-% IV SOLN
INTRAVENOUS | Status: DC | PRN
  Administered 2022-01-19 (×2): 500 mL

## 2022-01-19 MED ORDER — IOHEXOL 350 MG/ML SOLN
INTRAVENOUS | Status: DC | PRN
  Administered 2022-01-19: 75 mL

## 2022-01-19 MED ORDER — ASPIRIN 81 MG PO CHEW
81.0000 mg | CHEWABLE_TABLET | ORAL | Status: AC
  Administered 2022-01-19: 81 mg via ORAL
  Filled 2022-01-19: qty 1

## 2022-01-19 MED ORDER — VERAPAMIL HCL 2.5 MG/ML IV SOLN
INTRAVENOUS | Status: DC | PRN
  Administered 2022-01-19: 10 mL via INTRA_ARTERIAL

## 2022-01-19 MED ORDER — MIDAZOLAM HCL 2 MG/2ML IJ SOLN
INTRAMUSCULAR | Status: DC | PRN
  Administered 2022-01-19: 1 mg via INTRAVENOUS

## 2022-01-19 MED ORDER — LIDOCAINE HCL (PF) 1 % IJ SOLN
INTRAMUSCULAR | Status: DC | PRN
  Administered 2022-01-19: 4 mL

## 2022-01-19 MED ORDER — FUROSEMIDE 10 MG/ML IJ SOLN
INTRAMUSCULAR | Status: AC
  Filled 2022-01-19: qty 4

## 2022-01-19 MED ORDER — HEPARIN SODIUM (PORCINE) 1000 UNIT/ML IJ SOLN
INTRAMUSCULAR | Status: DC | PRN
  Administered 2022-01-19: 5500 [IU] via INTRAVENOUS

## 2022-01-19 MED ORDER — FUROSEMIDE 10 MG/ML IJ SOLN
INTRAMUSCULAR | Status: DC | PRN
  Administered 2022-01-19: 40 mg via INTRAVENOUS

## 2022-01-19 MED ORDER — HYDRALAZINE HCL 20 MG/ML IJ SOLN: 10.0000 mg | INTRAMUSCULAR | Status: DC | PRN

## 2022-01-19 MED ORDER — ONDANSETRON HCL 4 MG/2ML IJ SOLN: 4.0000 mg | Freq: Four times a day (QID) | INTRAMUSCULAR | Status: DC | PRN

## 2022-01-19 MED ORDER — ACETAMINOPHEN 325 MG PO TABS: 650.0000 mg | ORAL_TABLET | ORAL | Status: DC | PRN

## 2022-01-19 MED ORDER — FENTANYL CITRATE (PF) 100 MCG/2ML IJ SOLN
INTRAMUSCULAR | Status: DC | PRN
  Administered 2022-01-19: 25 ug via INTRAVENOUS

## 2022-01-19 MED ORDER — SODIUM CHLORIDE 0.9 % WEIGHT BASED INFUSION
3.0000 mL/kg/h | INTRAVENOUS | Status: AC
  Administered 2022-01-19: 3 mL/kg/h via INTRAVENOUS

## 2022-01-19 SURGICAL SUPPLY — 9 items
BAND ZEPHYR COMPRESS 30 LONG (HEMOSTASIS) ×1 IMPLANT
CATH OPTITORQUE TIG 4.0 5F (CATHETERS) ×1 IMPLANT
GLIDESHEATH SLEND SS 6F .021 (SHEATH) ×1 IMPLANT
GUIDEWIRE INQWIRE 1.5J.035X260 (WIRE) IMPLANT
INQWIRE 1.5J .035X260CM (WIRE) ×2
KIT HEART LEFT (KITS) ×2 IMPLANT
PACK CARDIAC CATHETERIZATION (CUSTOM PROCEDURE TRAY) ×2 IMPLANT
TRANSDUCER W/STOPCOCK (MISCELLANEOUS) ×2 IMPLANT
TUBING CIL FLEX 10 FLL-RA (TUBING) ×2 IMPLANT

## 2022-01-19 NOTE — Telephone Encounter (Signed)
Verbals left for [employer]. ?

## 2022-01-19 NOTE — Progress Notes (Signed)
Paged Dr Harding/ med rec not done waiting for return call ?

## 2022-01-19 NOTE — Interval H&P Note (Signed)
History and Physical Interval Note: ? ?01/19/2022 ?1:24 PM ? ?[name]  has presented today for surgery, with the diagnosis of chest pain, + Troponin & Abnormal Echocardiogram. .  The various methods of treatment have been discussed with the patient and family. After consideration of risks, benefits and other options for treatment, the patient has consented to  Procedure(s): ?LEFT HEART CATH AND CORONARY ANGIOGRAPHY (N/A)  ?PERCUTANEOUS CORONARY INTERVENTION ? ? ?as a surgical intervention.  The patient's history has been reviewed, patient examined, no change in status, stable for surgery.  I have reviewed the patient's chart and labs.  Questions were answered to the patient's satisfaction.   ? ? ?Cath Lab Visit (complete for each Cath Lab visit) ? ?Clinical Evaluation Leading to the Procedure:  ? ?ACS: No. - Recent ~ ACS ? ?Non-ACS:   ? ?Anginal Classification: CCS III - Only 1 episode = but with + Tropoin ? ?Anti-ischemic medical therapy: Minimal Therapy (1 class of medications) ? ?Non-Invasive Test Results: Equivocal test results -abnormal echocardiogram findings suggesting regional wall motion abnormality ? ?Prior CABG: No previous CABG ? ? ? ?[name] ? ? ?

## 2022-01-19 NOTE — Telephone Encounter (Signed)
Okay for orders? 

## 2022-01-20 ENCOUNTER — Encounter: Payer: Self-pay | Admitting: Internal Medicine

## 2022-01-20 ENCOUNTER — Encounter (HOSPITAL_COMMUNITY): Payer: Self-pay | Admitting: Cardiology

## 2022-01-20 DIAGNOSIS — G4733 Obstructive sleep apnea (adult) (pediatric): Secondary | ICD-10-CM

## 2022-01-20 DIAGNOSIS — M545 Low back pain, unspecified: Secondary | ICD-10-CM

## 2022-01-26 DIAGNOSIS — F32A Depression, unspecified: Secondary | ICD-10-CM

## 2022-01-26 DIAGNOSIS — K760 Fatty (change of) liver, not elsewhere classified: Secondary | ICD-10-CM

## 2022-01-26 DIAGNOSIS — F10239 Alcohol dependence with withdrawal, unspecified: Secondary | ICD-10-CM

## 2022-01-26 DIAGNOSIS — Z4800 Encounter for change or removal of nonsurgical wound dressing: Secondary | ICD-10-CM

## 2022-01-26 DIAGNOSIS — E1142 Type 2 diabetes mellitus with diabetic polyneuropathy: Secondary | ICD-10-CM

## 2022-01-26 DIAGNOSIS — F419 Anxiety disorder, unspecified: Secondary | ICD-10-CM

## 2022-01-26 DIAGNOSIS — M150 Primary generalized (osteo)arthritis: Secondary | ICD-10-CM

## 2022-01-26 DIAGNOSIS — K219 Gastro-esophageal reflux disease without esophagitis: Secondary | ICD-10-CM

## 2022-01-26 DIAGNOSIS — Z9181 History of falling: Secondary | ICD-10-CM

## 2022-01-26 DIAGNOSIS — G8929 Other chronic pain: Secondary | ICD-10-CM

## 2022-01-26 DIAGNOSIS — G4733 Obstructive sleep apnea (adult) (pediatric): Secondary | ICD-10-CM

## 2022-01-26 DIAGNOSIS — Z87891 Personal history of nicotine dependence: Secondary | ICD-10-CM

## 2022-01-26 DIAGNOSIS — M86672 Other chronic osteomyelitis, left ankle and foot: Secondary | ICD-10-CM

## 2022-01-26 DIAGNOSIS — G4700 Insomnia, unspecified: Secondary | ICD-10-CM

## 2022-01-26 DIAGNOSIS — I5021 Acute systolic (congestive) heart failure: Secondary | ICD-10-CM

## 2022-01-26 DIAGNOSIS — Z79891 Long term (current) use of opiate analgesic: Secondary | ICD-10-CM

## 2022-01-26 DIAGNOSIS — E11621 Type 2 diabetes mellitus with foot ulcer: Secondary | ICD-10-CM

## 2022-01-26 DIAGNOSIS — E662 Morbid (severe) obesity with alveolar hypoventilation: Secondary | ICD-10-CM

## 2022-01-26 DIAGNOSIS — I110 Hypertensive heart disease with heart failure: Secondary | ICD-10-CM

## 2022-01-26 DIAGNOSIS — E1169 Type 2 diabetes mellitus with other specified complication: Secondary | ICD-10-CM

## 2022-01-26 DIAGNOSIS — I5032 Chronic diastolic (congestive) heart failure: Secondary | ICD-10-CM

## 2022-01-26 DIAGNOSIS — L97429 Non-pressure chronic ulcer of left heel and midfoot with unspecified severity: Secondary | ICD-10-CM

## 2022-01-26 DIAGNOSIS — G5603 Carpal tunnel syndrome, bilateral upper limbs: Secondary | ICD-10-CM

## 2022-01-26 DIAGNOSIS — M5416 Radiculopathy, lumbar region: Secondary | ICD-10-CM

## 2022-01-26 DIAGNOSIS — Z6836 Body mass index (BMI) 36.0-36.9, adult: Secondary | ICD-10-CM

## 2022-01-26 DIAGNOSIS — D696 Thrombocytopenia, unspecified: Secondary | ICD-10-CM

## 2022-01-26 DIAGNOSIS — E785 Hyperlipidemia, unspecified: Secondary | ICD-10-CM

## 2022-01-27 ENCOUNTER — Encounter: Payer: Self-pay | Admitting: Internal Medicine

## 2022-01-27 ENCOUNTER — Encounter (HOSPITAL_BASED_OUTPATIENT_CLINIC_OR_DEPARTMENT_OTHER): Payer: Medicare Other | Attending: Physician Assistant | Admitting: Physician Assistant

## 2022-01-27 DIAGNOSIS — G4733 Obstructive sleep apnea (adult) (pediatric): Secondary | ICD-10-CM | POA: Diagnosis not present

## 2022-01-27 DIAGNOSIS — Z96642 Presence of left artificial hip joint: Secondary | ICD-10-CM | POA: Insufficient documentation

## 2022-01-27 DIAGNOSIS — M19072 Primary osteoarthritis, left ankle and foot: Secondary | ICD-10-CM | POA: Insufficient documentation

## 2022-01-27 DIAGNOSIS — Z96651 Presence of right artificial knee joint: Secondary | ICD-10-CM | POA: Insufficient documentation

## 2022-01-27 DIAGNOSIS — E11610 Type 2 diabetes mellitus with diabetic neuropathic arthropathy: Secondary | ICD-10-CM | POA: Diagnosis not present

## 2022-01-27 DIAGNOSIS — M86372 Chronic multifocal osteomyelitis, left ankle and foot: Secondary | ICD-10-CM | POA: Diagnosis not present

## 2022-01-27 DIAGNOSIS — E11621 Type 2 diabetes mellitus with foot ulcer: Secondary | ICD-10-CM | POA: Insufficient documentation

## 2022-01-27 DIAGNOSIS — L97525 Non-pressure chronic ulcer of other part of left foot with muscle involvement without evidence of necrosis: Secondary | ICD-10-CM | POA: Insufficient documentation

## 2022-01-27 DIAGNOSIS — E1169 Type 2 diabetes mellitus with other specified complication: Secondary | ICD-10-CM | POA: Insufficient documentation

## 2022-01-27 NOTE — Progress Notes (Deleted)
? ? Benito Mccreedy D.Merril Abbe ?[facility] ?[place] ?Phone: [phone] ?  ?Assessment and Plan:   ?  ?There are no diagnoses linked to this encounter.  ?*** ?  ?Pertinent previous records reviewed include *** ?  ?Follow Up: ***  ? ?  ?Subjective:   ?I, [name], am serving as a Education administrator for [employer] ? ?Chief Complaint: low back pain  ? ?HPI:  ?01/28/2022 ?Patient is a 61 year old male complaining of low back pain. Patient states ? ?Relevant Historical Information: *** ? ?Additional pertinent review of systems negative. ? ? ?Current Outpatient Medications:  ?  acamprosate (CAMPRAL) 333 MG tablet, Take 333 mg by mouth 2 (two) times daily., Disp: , Rfl:  ?  acetaminophen (TYLENOL) 325 MG tablet, Take 2 tablets (650 mg total) by mouth every 4 (four) hours as needed for headache or mild pain., Disp: , Rfl:  ?  amLODipine (NORVASC) 5 MG tablet, Take 5 mg by mouth daily., Disp: , Rfl:  ?  amoxicillin-clavulanate (AUGMENTIN) 875-125 MG tablet, Take 1 tablet by mouth 2 (two) times daily., Disp: 60 tablet, Rfl: 11 ?  aspirin EC 81 MG EC tablet, Take 1 tablet (81 mg total) by mouth daily. Swallow whole., Disp: 30 tablet, Rfl: 11 ?  atorvastatin (LIPITOR) 80 MG tablet, Take 1 tablet (80 mg total) by mouth daily., Disp: 30 tablet, Rfl: 1 ?  diclofenac (VOLTAREN) 75 MG EC tablet, Take 75 mg by mouth 2 (two) times daily., Disp: , Rfl:  ?  folic acid (FOLVITE) 1 MG tablet, TAKE 1 TABLET BY MOUTH EVERY DAY, Disp: 90 tablet, Rfl: 0 ?  furosemide (LASIX) 40 MG tablet, Take 1 tablet (40 mg total) by mouth daily as needed. Weight yourself daily.  Take 40 mg daily as needed if you gain > 3 pounds in a day or > 5 pounds in a week, and take for 2 to 3 days. (Patient taking differently: Take 40-80 mg by mouth See admin instructions. Weight yourself daily. 80 mg in the morning, 40 mg in the evening), Disp: 30 tablet, Rfl: 1 ?  gabapentin (NEURONTIN) 300 MG capsule,  Take 1-2 capsules (300-600 mg total) by mouth 3 (three) times daily. (Patient taking differently: Take 600 mg by mouth 2 (two) times daily.), Disp: 180 capsule, Rfl: 5 ?  losartan (COZAAR) 25 MG tablet, Take 1 tablet (25 mg total) by mouth daily., Disp: 90 tablet, Rfl: 1 ?  Magnesium 400 MG TABS, Take 400 mg by mouth daily. (Patient taking differently: Take 400 mg by mouth in the morning and at bedtime.), Disp: 30 tablet, Rfl: 5 ?  magnesium oxide (MAG-OX) 400 (240 Mg) MG tablet, Take 1 tablet by mouth daily., Disp: , Rfl:  ?  methocarbamol (ROBAXIN) 500 MG tablet, TAKE 1 TABLET BY MOUTH EVERY 8 HOURS AS NEEDED FOR MUSCLE SPASMS., Disp: 90 tablet, Rfl: 0 ?  metoprolol succinate (TOPROL-XL) 25 MG 24 hr tablet, Take 1 tablet (25 mg total) by mouth daily., Disp: 30 tablet, Rfl: 1 ?  nitroGLYCERIN (NITROSTAT) 0.4 MG SL tablet, Place 1 tablet (0.4 mg total) under the tongue every 5 (five) minutes x 3 doses as needed for chest pain., Disp: 15 tablet, Rfl: 1 ?  pantoprazole (PROTONIX) 40 MG tablet, Take 1 tablet (40 mg total) by mouth daily., Disp: 30 tablet, Rfl: 1 ?  potassium chloride SA (KLOR-CON M) 20 MEQ tablet, Take 60 mEq by mouth 2 (two) times daily., Disp: ,  Rfl:  ?  saccharomyces boulardii (FLORASTOR) 250 MG capsule, Take 1 capsule (250 mg total) by mouth 2 (two) times daily. (Patient taking differently: Take 250 mg by mouth daily.), Disp: , Rfl:  ?  sulfamethoxazole-trimethoprim (BACTRIM DS) 800-160 MG tablet, Take 1 tablet by mouth 2 (two) times daily., Disp: , Rfl:  ?  thiamine (VITAMIN B-1) 100 MG tablet, Take 100 mg by mouth daily., Disp: , Rfl:  ?  traZODone (DESYREL) 50 MG tablet, TAKE 1-2 TABLETS (50-100 MG TOTAL) BY MOUTH AT BEDTIME AS NEEDED FOR SLEEP., Disp: 180 tablet, Rfl: 1 ?  vitamin B-12 (CYANOCOBALAMIN) 100 MCG tablet, Take 100 mcg by mouth daily., Disp: , Rfl:   ? ?Objective:   ?  ?There were no vitals filed for this visit.  ?  ?There is no height or weight on file to calculate BMI.  ?   ?Physical Exam:   ? ?*** ? ? ?Electronically signed by:  ?Benito Mccreedy D.Merril Abbe ?[facility] ?3:25 PM 01/27/22 ?

## 2022-01-27 NOTE — Progress Notes (Signed)
JEMIAH, BASSIN ([id number]) ?Visit Report for 01/27/2022 ?Chief Complaint Document Details ?Patient Name: Date of Service: ?[place], [place] IG 01/27/2022 1:30 PM ?Medical Record Number: [id number] ?Patient Account Number: [id number] ?Date of Birth/Sex: Treating RN: ?1961/07/29 (61 y.o. Marcheta Grammes ?Primary Care Provider: Billey Gosling Other Clinician: ?Referring Provider: ?Treating Provider/Extender: Worthy Keeler ?Billey Gosling ?Weeks in Treatment: 0 ?Information Obtained from: Patient ?Chief Complaint ?Left foot ulcer ?Electronic Signature(s) ?Signed: 01/27/2022 2:22:25 PM By: Worthy Keeler PA-C ?Entered By: Worthy Keeler on 01/27/2022 14:22:24 ?-------------------------------------------------------------------------------- ?Debridement Details ?Patient Name: Date of Service: ?[place], [place] IG 01/27/2022 1:30 PM ?Medical Record Number: [id number] ?Patient Account Number: [id number] ?Date of Birth/Sex: Treating RN: ?04-03-1961 (61 y.o. Marcheta Grammes ?Primary Care Provider: Billey Gosling Other Clinician: ?Referring Provider: ?Treating Provider/Extender: Worthy Keeler ?Billey Gosling ?Weeks in Treatment: 0 ?Debridement Performed for Assessment: Wound #4 Left,Plantar Foot ?Performed By: Physician Worthy Keeler, PA ?Debridement Type: Debridement ?Severity of Tissue Pre Debridement: Fat layer exposed ?Level of Consciousness (Pre-procedure): Awake and Alert ?Pre-procedure Verification/Time Out Yes - 14:24 ?Taken: ?Start Time: 14:25 ?T Area Debrided (L x W): ?otal 2 (cm) x 5.2 (cm) = 10.4 (cm?) ?Tissue and other material debrided: ?Non-Viable, Skin: Dermis , Hyper-granulation ?Level: Skin/Dermis ?Debridement Description: Selective/Open Wound ?Instrument: ?Curette, Other : silver nitrate ?Bleeding: Minimum ?Hemostasis Achieved: Pressure ?End Time: 14:33 ?Response to Treatment: Procedure was tolerated well ?Level of Consciousness (Post- Awake and Alert ?procedure): ?Post Debridement Measurements of Total Wound ?Length: (cm)  5 ?Width: (cm) 5.2 ?Depth: (cm) 0.8 ?Volume: (cm?) 16.336 ?Character of Wound/Ulcer Post Debridement: Stable ?Severity of Tissue Post Debridement: Necrosis of muscle ?Post Procedure Diagnosis ?Same as Pre-procedure ?Electronic Signature(s) ?Signed: 01/27/2022 4:42:07 PM By: Worthy Keeler PA-C ?Signed: 01/27/2022 5:22:42 PM By: Lorrin Jackson ?Entered By: Lorrin Jackson on 01/27/2022 14:33:34 ?-------------------------------------------------------------------------------- ?HPI Details ?Patient Name: Date of Service: ?[place], [place] IG 01/27/2022 1:30 PM ?Medical Record Number: [id number] ?Patient Account Number: [id number] ?Date of Birth/Sex: Treating RN: ?10-18-1961 (61 y.o. Marcheta Grammes ?Primary Care Provider: Billey Gosling Other Clinician: ?Referring Provider: ?Treating Provider/Extender: Worthy Keeler ?Billey Gosling ?Weeks in Treatment: 0 ?History of Present Illness ?HPI Description: 10/31/2019 upon evaluation today patient appears to be doing somewhat poorly in regard to his bilateral plantar feet. He has wounds that he ?tells me have been present since 2012 intermittently off and on. Most recently this has been open for at least the past 6 months to a year. He has been trying ?to treat this in different ways using Santyl along with various other dressings including Medihoney and even at one point Xeroform. Nothing really has seem to ?get this completely closed. He was recently in the hospital for cellulitis of his leg subsequently he did have x-rays as well as MRIs that showed negative for ?any signs of osteomyelitis in regard to the wounds on his feet. Fortunately there is no signs of systemic infection at this time. No fevers, chills, nausea, ?vomiting, or diarrhea. Patient has previously used Darco offloading shoes as far as frontal floaters as well as postop surgical shoes. He has never been in a ?total contact cast that may be something we need to strongly consider here. Patient's most recent hemoglobin A1c  1 month ago was 5.3 seems to be very well ?controlled which is great. Subsequently he has seen vascular as well as podiatry. His ABIs are 1.07 on the left and 1.14 on the right he seems to be doing well ?he does have chronic venous stasis. ?11/07/2019 upon evaluation today  patient appears to be doing well with regard to his wounds all things considered. I do not see any severe worsening he still has ?some callus buildup on the right more than the left he notes that he has been probably more active than he should as far as walking is concerned is just very ?hard to not be active. He knows he needs to be more careful in this regard however. He is willing to give the cast a try at this point although he notes that he is ?a little nervous about this just with regard to balance although he will be very careful and obviously if he has any trouble he knows to contact the office and let ?me know. ?1/22; patient is in for his obligatory first total contact cast change. Our intake nurse reported a very large amount of drainage which is spelled out over to the ?surrounding skin. Has bilateral diabetic foot wounds. He has Charcot feet. We have been using silver alginate on his wounds. ?11/14/2019 on evaluation today patient is actually seeming to make good improvement in regard to his bilateral plantar foot wounds. We have been using a cast ?on the left side and on the right side he has been using dressings he is changing up his own accord. With that being said he tells me that he is also not walking ?as much just due to how unsteady he feels. He takes it easy when he does have to walk and when he does not have to walk he is resting. This is probably ?help in his right foot as well has the left foot which is actually measuring better. In fact both are measuring better. Overall I am very pleased with how things ?seem to be progressing. The patient does have some odor on the left foot this does have me concerned about the  possibility of infection, and actually ?probably go ahead and put him on antibiotics today as well as utilizing a continuation of the cast on the left foot I think that will be fine we probably just need to ?bring him in sooner to change this not last a whole week. ?1/29; we brought the patient back today for a total contact cast change on the left out of concern for excessive drainage. We are using drawtex over the ?wound as the primary dressing ?11/21/2019 on evaluation today patient appears to be doing well with regard to his left plantar foot. In fact both foot ulcers actually seem to be doing pretty well. ?Nonetheless he is having a lot of drainage on the left at this time and again we did obtain a wound culture did show positive for Staph aureus that was reviewed ?by myself today as well. Nonetheless he is on Bactrim which was shown to be sensitive that should be helping in this regard. Fortunately there is no signs of ?infection systemically at this point. ?2/5; back in clinic today for a total contact cast change apparently secondary to very significant drainage. Still using drawtex ?11/28/2019 upon evaluation today patient appears to be doing well with regard to his wounds. The right foot is doing okay as measured about the same in my ?opinion. The left foot is actually showing signs of significant improvement is measuring smaller there is a lot of hyper granulation likely due to the continued ?drainage at this point. We did obtain approval for a snap VAC I think that is good to be appropriate for him and will likely help this tremendously underneath the ?cast. He  is definitely in agreement with proceeding with such. ?2/12; patient came in today after his snap VAC lost suction. Brought in to see one of our nurses. The dressing was replaced and then we put the cast back on ?and rehooked up the snap VAC. Apparently his wound looked very good per our intake nurse. ?2/15; again we replaced the cast on Friday. By  Saturday the snap VAC and light suction. He called this morning he comes in acutely. The wounds look fine ?however the VAC is not functioning. We replaced the cast using silver alginate as the primary dressing b

## 2022-01-27 NOTE — Progress Notes (Addendum)
[name], [name] ([id number]) ?Visit Report for 01/27/2022 ?Allergy List Details ?Patient Name: Date of Service: ?[place], [place] IG 01/27/2022 1:30 PM ?Medical Record Number: [id number] ?Patient Account Number: [id number] ?Date of Birth/Sex: Treating RN: ?[birth year] (61 y.o. [name] ?Primary Care [name]: [name] Other Clinician: ?Referring [name]: ?Treating [name]/Extender: [name] ?[name] ?Weeks in Treatment: 0 ?Allergies ?Active Allergies ?Claritin ?Reaction: anxiety ?Severity: Moderate ?Allergy Notes ?Electronic Signature(s) ?Signed: 01/27/2022 5:22:42 PM By: Lorrin Jackson ?Entered By: Lorrin Jackson on 01/27/2022 13:58:28 ?-------------------------------------------------------------------------------- ?Arrival Information Details ?Patient Name: Date of Service: ?[name], [name] IG 01/27/2022 1:30 PM ?Medical Record Number: [id number] ?Patient Account Number: [id number] ?Date of Birth/Sex: Treating RN: ?1961-03-13 (61 y.o. [name] ?Primary Care Kirstina Leinweber: [name] Other Clinician: ?Referring Jacari Kirsten: ?Treating Perlita Forbush/Extender: [name] ?[name] ?Weeks in Treatment: 0 ?Visit Information ?Patient Arrived: [name] ?Arrival Time: 13:53 ?Transfer Assistance: None ?Patient Identification Verified: Yes ?Secondary Verification Process Completed: Yes ?Patient Requires Transmission-Based Precautions: No ?Patient Has Alerts: Yes ?Patient Alerts: Patient on Blood Thinner ?History Since Last Visit ?Added or deleted any medications: No ?Any new allergies or adverse reactions: No ?Had a fall or experienced change in activities of daily living that may affect risk of falls: No ?Signs or symptoms of abuse/neglect since last visito No ?Hospitalized since last visit: Yes ?Implantable device outside of the clinic excluding cellular tissue based products placed in the center since last visit: No ?Has Dressing in Place as Prescribed: Yes ?Pain Present Now: No ?Electronic Signature(s) ?Signed:  01/27/2022 5:22:42 PM By: Lorrin Jackson ?Entered By: Lorrin Jackson on 01/27/2022 13:57:05 ?-------------------------------------------------------------------------------- ?Clinic Level of Care Assessment Details ?Patient Name: Date of Service: ?[name], [name] IG 01/27/2022 1:30 PM ?Medical Record Number: [id number] ?Patient Account Number: [id number] ?Date of Birth/Sex: Treating RN: ?June 20, 1961 (61 y.o. [name] ?Primary Care Chayne Baumgart: [name] Other Clinician: ?Referring [name]: ?Treating [name]/Extender: [name] ?[name] ?Weeks in Treatment: 0 ?Clinic Level of Care Assessment Items ?TOOL 1 Quantity Score ?X- 1 0 ?Use when EandM and Procedure is performed on INITIAL visit ?ASSESSMENTS - Nursing Assessment / Reassessment ?X- 1 20 ?General Physical Exam (combine w/ comprehensive assessment (listed just below) when performed on new pt. evals) ?X- 1 25 ?Comprehensive Assessment (HX, ROS, Risk Assessments, Wounds Hx, etc.) ?ASSESSMENTS - Wound and Skin Assessment / Reassessment ?[]  - 0 ?Dermatologic / Skin Assessment (not related to wound area) ?ASSESSMENTS - Ostomy and/or Continence Assessment and Care ?[]  - 0 ?Incontinence Assessment and Management ?[]  - 0 ?Ostomy Care Assessment and Management (repouching, etc.) ?PROCESS - Coordination of Care ?[]  - 0 ?Simple Patient / Family Education for ongoing care ?X- 1 20 ?Complex (extensive) Patient / Family Education for ongoing care ?X- 1 10 ?Staff obtains Consents, Records, T Results / Process Orders ?est ?X- 1 10 ?Staff telephones [name], Nursing Homes / Clarify orders / etc ?[]  - 0 ?Routine Transfer to another Facility (non-emergent condition) ?[]  - 0 ?Routine Hospital Admission (non-emergent condition) ?[]  - 0 ?New Admissions / Biomedical engineer / Ordering NPWT Apligraf, etc. ?, ?[]  - 0 ?Emergency Hospital Admission (emergent condition) ?PROCESS - Special Needs ?[]  - 0 ?Pediatric / Minor Patient Management ?[]  - 0 ?Isolation Patient  Management ?[]  - 0 ?Hearing / Language / Visual special needs ?[]  - 0 ?Assessment of Community assistance (transportation, D/C planning, etc.) ?[]  - 0 ?Additional assistance / Altered mentation ?[]  - 0 ?Support Surface(s) Assessment (bed, cushion, seat, etc.) ?INTERVENTIONS - Miscellaneous ?[]  - 0 ?External ear exam ?[]  - 0 ?Patient Transfer (multiple  staff / Harrel Lemon Lift / Similar devices) ?[]  - 0 ?Simple Staple / Suture removal (25 or less) ?[]  - 0 ?Complex Staple / Suture removal (26 or more) ?[]  - 0 ?Hypo/Hyperglycemic Management (do not check if billed separately) ?[]  - 0 ?Ankle / Brachial Index (ABI) - do not check if billed separately ?Has the patient been seen at the hospital within the last three years: Yes ?Total Score: 85 ?Level Of Care: New/Established - Level 3 ?Electronic Signature(s) ?Signed: 01/27/2022 5:22:42 PM By: Lorrin Jackson ?Signed: 01/27/2022 5:22:42 PM By: Lorrin Jackson ?Entered By: Lorrin Jackson on 01/27/2022 14:42:46 ?-------------------------------------------------------------------------------- ?Encounter Discharge Information Details ?Patient Name: ?Date of Service: ?[name], [name] IG 01/27/2022 1:30 PM ?Medical Record Number: [id number] ?Patient Account Number: [id number] ?Date of Birth/Sex: ?Treating RN: ?[birth year] (61 y.o. [name] ?Primary Care [name]: [name] ?Other Clinician: ?Referring [name]: ?Treating [name]/Extender: [name] ?[name] ?Weeks in Treatment: 0 ?Encounter Discharge Information Items Post Procedure Vitals ?Discharge Condition: Stable ?Temperature (F): 98.6 ?Ambulatory Status: [name] ?Pulse (bpm): 111 ?Discharge Destination: Home ?Respiratory Rate (breaths/min): 22 ?Transportation: Private Auto ?Blood Pressure (mmHg): 142/73 ?Schedule Follow-up Appointment: Yes ?Clinical Summary of Care: Provided on 01/27/2022 ?Form Type Recipient ?Paper Patient Patient ?Electronic Signature(s) ?Signed: 01/27/2022 5:22:42 PM By: Lorrin Jackson ?Entered By:  Lorrin Jackson on 01/27/2022 14:46:15 ?-------------------------------------------------------------------------------- ?Lower Extremity Assessment Details ?Patient Name: ?Date of Service: ?[place], [place] IG 01/27/2022 1:30 PM ?Medical Record Number: [id number] ?Patient Account Number: [id number] ?Date of Birth/Sex: ?Treating RN: ?12/11/1960 (61 y.o. [name] ?Primary Care Monet North: [name] ?Other Clinician: ?Referring Asim Gersten: ?Treating Haydon Kalmar/Extender: [name] ?[name] ?Weeks in Treatment: 0 ?Edema Assessment ?Assessed: [Left: Yes] [Right: No] ?Edema: [Left: Ye] [Right: s] ?Calf ?Left: Right: ?Point of Measurement: 30 cm From Medial Instep 41 cm ?Ankle ?Left: Right: ?Point of Measurement: 11 cm From Medial Instep 28 cm ?Vascular Assessment ?Pulses: ?Dorsalis Pedis ?Palpable: [Left:Yes] ?Electronic Signature(s) ?Signed: 01/27/2022 5:22:42 PM By: Lorrin Jackson ?Entered By: Lorrin Jackson on 01/27/2022 14:11:49 ?-------------------------------------------------------------------------------- ?Multi-Disciplinary Care Plan Details ?Patient Name: ?Date of Service: ?[place], [place] IG 01/27/2022 1:30 PM ?Medical Record Number: [id number] ?Patient Account Number: [id number] ?Date of Birth/Sex: ?Treating RN: ?Sep 08, 1961 (61 y.o. [name] ?Primary Care Olon Russ: [name] ?Other Clinician: ?Referring Khaylee Mcevoy: ?Treating Jaina Morin/Extender: [name] ?[name] ?Weeks in Treatment: 0 ?Active Inactive ?Electronic Signature(s) ?Signed: 02/26/2022 10:33:12 AM By: Lorrin Jackson ?Previous Signature: 01/27/2022 5:22:42 PM Version By: Lorrin Jackson ?Entered By: Lorrin Jackson on 02/26/2022 10:33:12 ?-------------------------------------------------------------------------------- ?Pain Assessment Details ?Patient Name: ?Date of Service: ?[place], [place] IG 01/27/2022 1:30 PM ?Medical Record Number: [id number] ?Patient Account Number: [id number] ?Date of Birth/Sex: ?Treating RN: ?Jun 14, 1961 (61 y.o. [name] ?Primary Care Karalyne Nusser: [name] ?Other Clinician: ?Referring Goerge Mohr: ?Treating Tymeer Vaquera/Extender: [name] ?[name] ?Weeks in Treatment: 0 ?Active Problems ?Location of Pain [place]

## 2022-01-27 NOTE — Progress Notes (Signed)
TAWN, HILL ([id number]) ?Visit Report for 01/27/2022 ?Abuse Risk Screen Details ?Patient Name: Date of Service: ?[place], [place] IG 01/27/2022 1:30 PM ?Medical Record Number: [id number] ?Patient Account Number: [id number] ?Date of Birth/Sex: Treating RN: ?[birth year] (61 y.o. [name] ?Primary Care Michaelyn Wall: Billey Gosling Other Clinician: ?Referring Haakon Titsworth: ?Treating Ivie Maese/Extender: Worthy Keeler ?Billey Gosling ?Weeks in Treatment: 0 ?Abuse Risk Screen Items ?Answer ?ABUSE RISK SCREEN: ?Has anyone close to you tried to hurt or harm you recentlyo No ?Do you feel uncomfortable with anyone in your familyo No ?Has anyone forced you do things that you didnt want to doo No ?Electronic Signature(s) ?Signed: 01/27/2022 5:22:42 PM By: Lorrin Jackson ?Entered By: Lorrin Jackson on 01/27/2022 14:01:39 ?-------------------------------------------------------------------------------- ?Activities of Daily Living Details ?Patient Name: Date of Service: ?[place], [place] IG 01/27/2022 1:30 PM ?Medical Record Number: [id number] ?Patient Account Number: [id number] ?Date of Birth/Sex: Treating RN: ?06-Oct-1961 (61 y.o. [name] ?Primary Care Hilding Quintanar: Billey Gosling Other Clinician: ?Referring Mackie Goon: ?Treating Thanvi Blincoe/Extender: Worthy Keeler ?Billey Gosling ?Weeks in Treatment: 0 ?Activities of Daily Living Items ?Answer ?Activities of Daily Living (Please select one for each item) ?[place] ?T Medications ?ake Completely Able ?Use T elephone Completely Able ?Care for Appearance Completely Able ?Use T oilet Completely Able ?Bath / Shower Completely Able ?Dress Self Completely Able ?Feed Self Completely Able ?Walk Need Assistance ?Get In / Out Bed Completely Able ?Housework Completely Able ?Prepare Meals Completely Able ?Handle Money Completely Able ?Shop for Self Completely Able ?Electronic Signature(s) ?Signed: 01/27/2022 5:22:42 PM By: Lorrin Jackson ?Entered By: Lorrin Jackson on 01/27/2022  14:02:02 ?-------------------------------------------------------------------------------- ?Education Screening Details ?Patient Name: ?Date of Service: ?[place], [place] IG 01/27/2022 1:30 PM ?Medical Record Number: [id number] ?Patient Account Number: [id number] ?Date of Birth/Sex: ?Treating RN: ?May 12, 1961 (61 y.o. [name] ?Primary Care Yanely Mast: Billey Gosling ?Other Clinician: ?Referring Shevon Sian: ?Treating Johnhenry Tippin/Extender: Worthy Keeler ?Billey Gosling ?Weeks in Treatment: 0 ?Primary Learner Assessed: Patient ?Learning Preferences/Education Level/Primary Language ?Learning Preference: Explanation, Demonstration, Printed Material ?Highest Education Level: College or Above ?Preferred Language: English ?Cognitive Barrier ?Language Barrier: No ?Translator Needed: No ?Memory Deficit: No ?Emotional Barrier: No ?Cultural/Religious Beliefs Affecting Medical Care: No ?Physical Barrier ?Impaired Vision: Yes Glasses ?Impaired Hearing: No ?Decreased Hand dexterity: No ?Knowledge/Comprehension ?Knowledge Level: High ?Comprehension Level: High ?Ability to understand written instructions: High ?Ability to understand verbal instructions: High ?Motivation ?Anxiety Level: Calm ?Cooperation: Cooperative ?Education Importance: Acknowledges Need ?Interest in Health Problems: Asks Questions ?Perception: Coherent ?Willingness to Engage in Self-Management Medium ?Activities: ?Readiness to Engage in Self-Management Medium ?Activities: ?Electronic Signature(s) ?Signed: 01/27/2022 5:22:42 PM By: Lorrin Jackson ?Entered By: Lorrin Jackson on 01/27/2022 14:03:21 ?-------------------------------------------------------------------------------- ?Fall Risk Assessment Details ?Patient Name: ?Date of Service: ?[place], [place] IG 01/27/2022 1:30 PM ?Medical Record Number: [id number] ?Patient Account Number: [id number] ?Date of Birth/Sex: ?Treating RN: ?10/16/1961 (61 y.o. [name] ?Primary Care Kristy Schomburg: Billey Gosling ?Other  Clinician: ?Referring Farrell Pantaleo: ?Treating Winfrey Chillemi/Extender: Worthy Keeler ?Billey Gosling ?Weeks in Treatment: 0 ?Fall Risk Assessment Items ?Have you had 2 or more falls in the last 12 monthso 0 No ?Have you had any fall that resulted in injury in the last 12 monthso 0 No ?FALLS RISK SCREEN ?History of falling - immediate or within 3 months 0 No ?Secondary diagnosis (Do you have 2 or more medical diagnoseso) 15 Yes ?Ambulatory aid ?None/bed rest/wheelchair/nurse 0 No ?Crutches/cane/walker 15 Yes ?Furniture 0 No ?Intravenous therapy Access/Saline/Heparin Lock 0 No ?Gait/Transferring ?Normal/ bed rest/ wheelchair 0 Yes ?Weak (short steps with or without shuffle, stooped but  able to lift head while walking, may seek 0 No ?support from furniture) ?Impaired (short steps with shuffle, may have difficulty arising from chair, head down, impaired 0 No ?balance) ?Mental Status ?Oriented to own ability 0 Yes ?Electronic Signature(s) ?Signed: 01/27/2022 5:22:42 PM By: Lorrin Jackson ?Entered By: Lorrin Jackson on 01/27/2022 14:03:50 ?-------------------------------------------------------------------------------- ?Foot Assessment Details ?Patient Name: ?Date of Service: ?[place], [place] IG 01/27/2022 1:30 PM ?Medical Record Number: [id number] ?Patient Account Number: [id number] ?Date of Birth/Sex: ?Treating RN: ?[birth year] (61 y.o. [name] ?Primary Care Tacey Dimaggio: Billey Gosling ?Other Clinician: ?Referring Christropher Gintz: ?Treating Azarion Hove/Extender: Worthy Keeler ?Billey Gosling ?Weeks in Treatment: 0 ?Foot Assessment Items ?Site Locations ?+ = Sensation present, - = Sensation absent, C = Callus, U = Ulcer ?R = Redness, W = Warmth, M = Maceration, PU = Pre-ulcerative lesion ?F = Fissure, S = Swelling, D = Dryness ?Assessment ?Right: Left: ?Other Deformity: No No ?Prior Foot Ulcer: No No ?Prior Amputation: No No ?Charcot Joint: No No ?Ambulatory Status: Ambulatory With Help ?Assistance Device: Walker ?Gait: Steady ?Electronic  Signature(s) ?Signed: 01/27/2022 5:22:42 PM By: Lorrin Jackson ?Entered By: Lorrin Jackson on 01/27/2022 14:07:25 ?-------------------------------------------------------------------------------- ?Nutrition Risk Screening Details ?Patient Name: ?Date of Service: ?[place], [place] IG 01/27/2022 1:30 PM ?Medical Record Number: [id number] ?Patient Account Number: [id number] ?Date of Birth/Sex: ?Treating RN: ?04-12-1961 (61 y.o. [name] ?Primary Care Brennin Durfee: Billey Gosling ?Other Clinician: ?Referring Shakeita Vandevander: ?Treating Amani Marseille/Extender: Worthy Keeler ?Billey Gosling ?Weeks in Treatment: 0 ?Height (in): ?Weight (lbs): ?Body Mass Index (BMI): ?Nutrition Risk Screening Items ?Score Screening ?NUTRITION RISK SCREEN: ?I have an illness or condition that made me change the kind and/or amount of food I eat 0 No ?I eat fewer than two meals per day 0 No ?I eat few fruits and vegetables, or milk products 0 No ?I have three or more drinks of beer, liquor or wine almost every day 0 No ?I have tooth or mouth problems that make it hard for me to eat 0 No ?I don't always have enough money to buy the food I need 0 No ?I eat alone most of the time 0 No ?I take three or more different prescribed or over-the-counter drugs a day 1 Yes ?Without wanting to, I have lost or gained 10 pounds in the last six months 0 No ?I am not always physically able to shop, cook and/or feed myself 0 No ?Nutrition Protocols ?Good Risk Protocol 0 No interventions needed ?Moderate Risk Protocol ?High Risk Proctocol ?Risk Level: Good Risk ?Score: 1 ?Electronic Signature(s) ?Signed: 01/27/2022 5:22:42 PM By: Lorrin Jackson ?Entered By: Lorrin Jackson on 01/27/2022 14:04:10 ?

## 2022-01-28 ENCOUNTER — Ambulatory Visit: Payer: Medicare Other | Admitting: Sports Medicine

## 2022-01-28 ENCOUNTER — Encounter: Payer: Self-pay | Admitting: Internal Medicine

## 2022-01-29 NOTE — Progress Notes (Deleted)
? ? [name] D.[name] ?[facility] ?[place] ?Phone: [phone] ?  ?Assessment and Plan:   ?  ?There are no diagnoses linked to this encounter.  ?*** ?  ?Pertinent previous records reviewed include *** ?  ?Follow Up: ***  ? ?  ?Subjective:   ?I, Pincus Badder, am serving as a Education administrator for [employer] ? ?Chief Complaint: low back pain ? ?HPI:  ? ?02/01/2022 ?Patient is a 62 year old male complaining of low back pain . Patient states ? ?Relevant Historical Information: *** ? ?Additional pertinent review of systems negative. ? ? ?Current Outpatient Medications:  ?  acamprosate (CAMPRAL) 333 MG tablet, Take 333 mg by mouth 2 (two) times daily., Disp: , Rfl:  ?  acetaminophen (TYLENOL) 325 MG tablet, Take 2 tablets (650 mg total) by mouth every 4 (four) hours as needed for headache or mild pain., Disp: , Rfl:  ?  amLODipine (NORVASC) 5 MG tablet, Take 5 mg by mouth daily., Disp: , Rfl:  ?  amoxicillin-clavulanate (AUGMENTIN) 875-125 MG tablet, Take 1 tablet by mouth 2 (two) times daily., Disp: 60 tablet, Rfl: 11 ?  aspirin EC 81 MG EC tablet, Take 1 tablet (81 mg total) by mouth daily. Swallow whole., Disp: 30 tablet, Rfl: 11 ?  atorvastatin (LIPITOR) 80 MG tablet, Take 1 tablet (80 mg total) by mouth daily., Disp: 30 tablet, Rfl: 1 ?  diclofenac (VOLTAREN) 75 MG EC tablet, Take 75 mg by mouth 2 (two) times daily., Disp: , Rfl:  ?  folic acid (FOLVITE) 1 MG tablet, TAKE 1 TABLET BY MOUTH EVERY DAY, Disp: 90 tablet, Rfl: 0 ?  furosemide (LASIX) 40 MG tablet, Take 1 tablet (40 mg total) by mouth daily as needed. Weight yourself daily.  Take 40 mg daily as needed if you gain > 3 pounds in a day or > 5 pounds in a week, and take for 2 to 3 days. (Patient taking differently: Take 40-80 mg by mouth See admin instructions. Weight yourself daily. 80 mg in the morning, 40 mg in the evening), Disp: 30 tablet, Rfl: 1 ?  gabapentin (NEURONTIN) 300 MG capsule,  Take 1-2 capsules (300-600 mg total) by mouth 3 (three) times daily. (Patient taking differently: Take 600 mg by mouth 2 (two) times daily.), Disp: 180 capsule, Rfl: 5 ?  losartan (COZAAR) 25 MG tablet, Take 1 tablet (25 mg total) by mouth daily., Disp: 90 tablet, Rfl: 1 ?  Magnesium 400 MG TABS, Take 400 mg by mouth daily. (Patient taking differently: Take 400 mg by mouth in the morning and at bedtime.), Disp: 30 tablet, Rfl: 5 ?  magnesium oxide (MAG-OX) 400 (240 Mg) MG tablet, Take 1 tablet by mouth daily., Disp: , Rfl:  ?  methocarbamol (ROBAXIN) 500 MG tablet, TAKE 1 TABLET BY MOUTH EVERY 8 HOURS AS NEEDED FOR MUSCLE SPASMS., Disp: 90 tablet, Rfl: 0 ?  metoprolol succinate (TOPROL-XL) 25 MG 24 hr tablet, Take 1 tablet (25 mg total) by mouth daily., Disp: 30 tablet, Rfl: 1 ?  nitroGLYCERIN (NITROSTAT) 0.4 MG SL tablet, Place 1 tablet (0.4 mg total) under the tongue every 5 (five) minutes x 3 doses as needed for chest pain., Disp: 15 tablet, Rfl: 1 ?  pantoprazole (PROTONIX) 40 MG tablet, Take 1 tablet (40 mg total) by mouth daily., Disp: 30 tablet, Rfl: 1 ?  potassium chloride SA (KLOR-CON M) 20 MEQ tablet, Take 60 mEq by mouth 2 (two) times daily., Disp: ,  Rfl:  ?  saccharomyces boulardii (FLORASTOR) 250 MG capsule, Take 1 capsule (250 mg total) by mouth 2 (two) times daily. (Patient taking differently: Take 250 mg by mouth daily.), Disp: , Rfl:  ?  sulfamethoxazole-trimethoprim (BACTRIM DS) 800-160 MG tablet, Take 1 tablet by mouth 2 (two) times daily., Disp: , Rfl:  ?  thiamine (VITAMIN B-1) 100 MG tablet, Take 100 mg by mouth daily., Disp: , Rfl:  ?  traZODone (DESYREL) 50 MG tablet, TAKE 1-2 TABLETS (50-100 MG TOTAL) BY MOUTH AT BEDTIME AS NEEDED FOR SLEEP., Disp: 180 tablet, Rfl: 1 ?  vitamin B-12 (CYANOCOBALAMIN) 100 MCG tablet, Take 100 mcg by mouth daily., Disp: , Rfl:   ? ?Objective:   ?  ?There were no vitals filed for this visit.  ?  ?There is no height or weight on file to calculate BMI.  ?   ?Physical Exam:   ? ?*** ? ? ?Electronically signed by:  ?[name] D.[name] ?[facility] ?10:37 AM 01/29/22 ?

## 2022-02-01 ENCOUNTER — Ambulatory Visit: Payer: Medicare Other | Admitting: Sports Medicine

## 2022-02-03 ENCOUNTER — Encounter (HOSPITAL_BASED_OUTPATIENT_CLINIC_OR_DEPARTMENT_OTHER): Payer: Medicare Other | Admitting: Physician Assistant

## 2022-02-04 ENCOUNTER — Telehealth: Payer: Self-pay | Admitting: Internal Medicine

## 2022-02-04 NOTE — Telephone Encounter (Signed)
Caller Mellisa states pt is scheduled to go to a treatment center but is currently going through alcohol withdrawals ? ?Caller inquiring if provider can prescribe something to help pt "calm down" ? ?Caller requesting a cb ? ?

## 2022-02-04 NOTE — Telephone Encounter (Signed)
Spoke to patient about scheduling Medicare Annual Wellness Visit, he wants to call back and schedule at a later time. ? ?Last AWV  02/25/20 ? ?Please schedule at anytime with LB Chrisney if patient calls the office back.   ? ? ?Any questions, please call me at [phone]  ?

## 2022-02-05 ENCOUNTER — Other Ambulatory Visit: Payer: Self-pay | Admitting: Physician Assistant

## 2022-02-05 ENCOUNTER — Telehealth: Payer: Self-pay

## 2022-02-05 ENCOUNTER — Encounter: Payer: Self-pay | Admitting: Internal Medicine

## 2022-02-05 ENCOUNTER — Other Ambulatory Visit: Payer: Self-pay | Admitting: Internal Medicine

## 2022-02-05 MED ORDER — CHLORDIAZEPOXIDE HCL 25 MG PO CAPS
25.0000 mg | ORAL_CAPSULE | Freq: Three times a day (TID) | ORAL | 0 refills | Status: AC | PRN
Start: 2022-02-05 — End: 2022-02-10

## 2022-02-05 NOTE — Telephone Encounter (Signed)
Spoke with [name] today. ?

## 2022-02-05 NOTE — Telephone Encounter (Signed)
Pt is requesting a referral for [facility] center. ?Address: [address], [place], [place] [address] ?Phone: [phone] ? ?Pt wants to be admitted for Detox, foot ulcer, low back pain (to get PT). ? ?Please advise  ? ?

## 2022-02-05 NOTE — Telephone Encounter (Signed)
I will send in something you can take up to 3 times a day.  He cannot drink alcohol with this.  This is only for 3 days. ?

## 2022-02-05 NOTE — Telephone Encounter (Signed)
Pt states a fl2 form is needed w/ referral noted below ?

## 2022-02-08 NOTE — Telephone Encounter (Signed)
Pt is calling to check the status of the Form FL2 for the rehab referral ? ?Please advise ?

## 2022-02-08 NOTE — Telephone Encounter (Signed)
Will try to complete for today in between patients. ? ?Form turn around time is 7-10 days. ? ?We just received request on Friday. ?

## 2022-02-09 NOTE — Telephone Encounter (Signed)
Form completed and awaiting Dr. Lawerance Bach signature ?

## 2022-02-09 NOTE — Telephone Encounter (Signed)
Spoke with patient today to let him know paperwork had been faxed. ? ?He states they can not accept him unless he comes from the hospital. ?

## 2022-02-09 NOTE — Telephone Encounter (Signed)
Paperwork faxed today and conformation received.  ?

## 2022-02-09 NOTE — Telephone Encounter (Signed)
Pt called in to get update on form for rehab.  ? ?I advised the pt of the update that forms were just waiting on MD signature. Would be informed when they are complete normal turn around time is 7-10 BUS days ? ?FYI ?

## 2022-02-11 ENCOUNTER — Institutional Professional Consult (permissible substitution): Payer: Medicare Other | Admitting: Primary Care

## 2022-02-11 ENCOUNTER — Telehealth: Payer: Self-pay

## 2022-02-11 MED ORDER — DICLOFENAC SODIUM 75 MG PO TBEC: 75.0000 mg | DELAYED_RELEASE_TABLET | Freq: Two times a day (BID) | ORAL | 3 refills | Status: DC

## 2022-02-11 NOTE — Telephone Encounter (Signed)
Re-faxed to number given.

## 2022-02-11 NOTE — Telephone Encounter (Signed)
Pt is requesting a refill on: ?diclofenac (VOLTAREN) 75 MG EC tablet ? ?Pharmacy: ?[id number] - [place], [place] - [address] ? ?LOV 01/18/22 ? ?

## 2022-02-11 NOTE — Telephone Encounter (Signed)
Pt is asking that the referral be faxed to [phone]. ?

## 2022-02-18 ENCOUNTER — Encounter: Payer: Medicare Other | Admitting: Physician Assistant

## 2022-02-18 NOTE — Progress Notes (Signed)
No show  This encounter was created in error - please disregard.

## 2022-02-20 ENCOUNTER — Encounter (HOSPITAL_COMMUNITY): Payer: Self-pay | Admitting: Emergency Medicine

## 2022-02-20 ENCOUNTER — Other Ambulatory Visit: Payer: Self-pay

## 2022-02-20 ENCOUNTER — Observation Stay (HOSPITAL_COMMUNITY)
Admission: EM | Admit: 2022-02-20 | Discharge: 2022-02-21 | Disposition: A | Discharge status: Skilled Nursing Facility | Payer: Medicare Other | Attending: Internal Medicine | Admitting: Internal Medicine

## 2022-02-20 ENCOUNTER — Observation Stay (HOSPITAL_COMMUNITY): Payer: Medicare Other

## 2022-02-20 ENCOUNTER — Emergency Department (HOSPITAL_COMMUNITY): Payer: Medicare Other

## 2022-02-20 DIAGNOSIS — E119 Type 2 diabetes mellitus without complications: Secondary | ICD-10-CM | POA: Diagnosis present

## 2022-02-20 DIAGNOSIS — Z87891 Personal history of nicotine dependence: Secondary | ICD-10-CM | POA: Diagnosis not present

## 2022-02-20 DIAGNOSIS — E875 Hyperkalemia: Secondary | ICD-10-CM

## 2022-02-20 DIAGNOSIS — Z96643 Presence of artificial hip joint, bilateral: Secondary | ICD-10-CM | POA: Insufficient documentation

## 2022-02-20 DIAGNOSIS — L97529 Non-pressure chronic ulcer of other part of left foot with unspecified severity: Secondary | ICD-10-CM | POA: Diagnosis not present

## 2022-02-20 DIAGNOSIS — Z79899 Other long term (current) drug therapy: Secondary | ICD-10-CM | POA: Insufficient documentation

## 2022-02-20 DIAGNOSIS — I110 Hypertensive heart disease with heart failure: Secondary | ICD-10-CM | POA: Diagnosis not present

## 2022-02-20 DIAGNOSIS — Z7982 Long term (current) use of aspirin: Secondary | ICD-10-CM | POA: Diagnosis not present

## 2022-02-20 DIAGNOSIS — G9341 Metabolic encephalopathy: Secondary | ICD-10-CM | POA: Diagnosis not present

## 2022-02-20 DIAGNOSIS — I5022 Chronic systolic (congestive) heart failure: Secondary | ICD-10-CM | POA: Insufficient documentation

## 2022-02-20 DIAGNOSIS — R0902 Hypoxemia: Secondary | ICD-10-CM

## 2022-02-20 DIAGNOSIS — G4733 Obstructive sleep apnea (adult) (pediatric): Secondary | ICD-10-CM | POA: Diagnosis present

## 2022-02-20 DIAGNOSIS — I10 Essential (primary) hypertension: Secondary | ICD-10-CM | POA: Diagnosis present

## 2022-02-20 DIAGNOSIS — Z96653 Presence of artificial knee joint, bilateral: Secondary | ICD-10-CM | POA: Diagnosis not present

## 2022-02-20 DIAGNOSIS — J189 Pneumonia, unspecified organism: Secondary | ICD-10-CM

## 2022-02-20 DIAGNOSIS — L97509 Non-pressure chronic ulcer of other part of unspecified foot with unspecified severity: Secondary | ICD-10-CM | POA: Diagnosis present

## 2022-02-20 DIAGNOSIS — R4182 Altered mental status, unspecified: Secondary | ICD-10-CM | POA: Diagnosis present

## 2022-02-20 DIAGNOSIS — E11621 Type 2 diabetes mellitus with foot ulcer: Secondary | ICD-10-CM | POA: Diagnosis present

## 2022-02-20 DIAGNOSIS — E785 Hyperlipidemia, unspecified: Secondary | ICD-10-CM | POA: Diagnosis present

## 2022-02-20 LAB — CBC WITH DIFFERENTIAL/PLATELET
Abs Immature Granulocytes: 0.03 10*3/uL (ref 0.00–0.07)
Basophils Absolute: 0.1 10*3/uL (ref 0.0–0.1)
Basophils Relative: 1 %
Eosinophils Absolute: 0 10*3/uL (ref 0.0–0.5)
Eosinophils Relative: 0 %
HCT: 36.8 % — ABNORMAL LOW (ref 39.0–52.0)
Hemoglobin: 11.3 g/dL — ABNORMAL LOW (ref 13.0–17.0)
Immature Granulocytes: 1 %
Lymphocytes Relative: 10 %
Lymphs Abs: 0.6 10*3/uL — ABNORMAL LOW (ref 0.7–4.0)
MCH: 32.2 pg (ref 26.0–34.0)
MCHC: 30.7 g/dL (ref 30.0–36.0)
MCV: 104.8 fL — ABNORMAL HIGH (ref 80.0–100.0)
Monocytes Absolute: 0.4 10*3/uL (ref 0.1–1.0)
Monocytes Relative: 7 %
Neutro Abs: 4.8 10*3/uL (ref 1.7–7.7)
Neutrophils Relative %: 81 %
Platelets: 242 10*3/uL (ref 150–400)
RBC: 3.51 MIL/uL — ABNORMAL LOW (ref 4.22–5.81)
RDW: 16 % — ABNORMAL HIGH (ref 11.5–15.5)
WBC: 5.9 10*3/uL (ref 4.0–10.5)
nRBC: 0 % (ref 0.0–0.2)

## 2022-02-20 LAB — I-STAT VENOUS BLOOD GAS, ED
Acid-base deficit: 3 mmol/L — ABNORMAL HIGH (ref 0.0–2.0)
Bicarbonate: 22.4 mmol/L (ref 20.0–28.0)
Calcium, Ion: 1.11 mmol/L — ABNORMAL LOW (ref 1.15–1.40)
HCT: 34 % — ABNORMAL LOW (ref 39.0–52.0)
Hemoglobin: 11.6 g/dL — ABNORMAL LOW (ref 13.0–17.0)
O2 Saturation: 96 %
Potassium: 5.6 mmol/L — ABNORMAL HIGH (ref 3.5–5.1)
Sodium: 137 mmol/L (ref 135–145)
TCO2: 24 mmol/L (ref 22–32)
pCO2, Ven: 38.8 mmHg — ABNORMAL LOW (ref 44–60)
pH, Ven: 7.37 (ref 7.25–7.43)
pO2, Ven: 83 mmHg — ABNORMAL HIGH (ref 32–45)

## 2022-02-20 LAB — COMPREHENSIVE METABOLIC PANEL
ALT: 31 U/L (ref 0–44)
AST: 47 U/L — ABNORMAL HIGH (ref 15–41)
Albumin: 2.8 g/dL — ABNORMAL LOW (ref 3.5–5.0)
Alkaline Phosphatase: 275 U/L — ABNORMAL HIGH (ref 38–126)
Anion gap: 8 (ref 5–15)
BUN: 6 mg/dL (ref 6–20)
CO2: 19 mmol/L — ABNORMAL LOW (ref 22–32)
Calcium: 9 mg/dL (ref 8.9–10.3)
Chloride: 108 mmol/L (ref 98–111)
Creatinine, Ser: 0.99 mg/dL (ref 0.61–1.24)
GFR, Estimated: >60 mL/min (ref >60)
Glucose, Bld: 126 mg/dL — ABNORMAL HIGH (ref 70–99)
Potassium: 5.3 mmol/L — ABNORMAL HIGH (ref 3.5–5.1)
Sodium: 135 mmol/L (ref 135–145)
Total Bilirubin: 1.4 mg/dL — ABNORMAL HIGH (ref 0.3–1.2)
Total Protein: 7.3 g/dL (ref 6.5–8.1)

## 2022-02-20 LAB — ETHANOL: Alcohol, Ethyl (B): <10 mg/dL (ref <10)

## 2022-02-20 LAB — PROCALCITONIN: Procalcitonin: <0.1 ng/mL

## 2022-02-20 LAB — TROPONIN I (HIGH SENSITIVITY)
Troponin I (High Sensitivity): 5 ng/L (ref <18)
Troponin I (High Sensitivity): 6 ng/L (ref <18)

## 2022-02-20 LAB — GLUCOSE, CAPILLARY: Glucose-Capillary: 58 mg/dL — ABNORMAL LOW (ref 70–99)

## 2022-02-20 MED ORDER — SODIUM CHLORIDE 0.9% FLUSH
3.0000 mL | Freq: Two times a day (BID) | INTRAVENOUS | Status: DC
  Administered 2022-02-20 – 2022-02-21 (×2): 3 mL via INTRAVENOUS

## 2022-02-20 MED ORDER — ENOXAPARIN SODIUM 60 MG/0.6ML IJ SOSY
60.0000 mg | PREFILLED_SYRINGE | INTRAMUSCULAR | Status: DC
  Administered 2022-02-20: 60 mg via SUBCUTANEOUS
  Filled 2022-02-20 (×2): qty 0.6

## 2022-02-20 MED ORDER — ALBUTEROL SULFATE (2.5 MG/3ML) 0.083% IN NEBU: 2.5000 mg | INHALATION_SOLUTION | RESPIRATORY_TRACT | Status: DC | PRN

## 2022-02-20 MED ORDER — NITROGLYCERIN 0.4 MG SL SUBL: 0.4000 mg | SUBLINGUAL_TABLET | SUBLINGUAL | Status: DC | PRN

## 2022-02-20 MED ORDER — AMLODIPINE BESYLATE 5 MG PO TABS
5.0000 mg | ORAL_TABLET | Freq: Every day | ORAL | Status: DC
  Administered 2022-02-21: 5 mg via ORAL
  Filled 2022-02-20: qty 1

## 2022-02-20 MED ORDER — NALOXONE HCL 2 MG/2ML IJ SOSY
2.0000 mg | PREFILLED_SYRINGE | INTRAMUSCULAR | Status: DC | PRN
  Administered 2022-02-20: 2 mg via INTRAVENOUS
  Filled 2022-02-20 (×2): qty 2

## 2022-02-20 MED ORDER — AZITHROMYCIN 500 MG IV SOLR
500.0000 mg | Freq: Once | INTRAVENOUS | Status: AC
  Administered 2022-02-20: 500 mg via INTRAVENOUS
  Filled 2022-02-20: qty 5

## 2022-02-20 MED ORDER — THIAMINE HCL 100 MG PO TABS
100.0000 mg | ORAL_TABLET | Freq: Every day | ORAL | Status: DC
  Administered 2022-02-21: 100 mg via ORAL
  Filled 2022-02-20: qty 1

## 2022-02-20 MED ORDER — SACCHAROMYCES BOULARDII 250 MG PO CAPS
250.0000 mg | ORAL_CAPSULE | Freq: Every day | ORAL | Status: DC
  Administered 2022-02-21: 250 mg via ORAL
  Filled 2022-02-20: qty 1

## 2022-02-20 MED ORDER — FOLIC ACID 1 MG PO TABS
1.0000 mg | ORAL_TABLET | Freq: Every day | ORAL | Status: DC
  Administered 2022-02-21: 1 mg via ORAL
  Filled 2022-02-20: qty 1

## 2022-02-20 MED ORDER — SODIUM CHLORIDE 0.9 % IV SOLN
1.0000 g | Freq: Once | INTRAVENOUS | Status: AC
  Administered 2022-02-20: 1 g via INTRAVENOUS
  Filled 2022-02-20: qty 10

## 2022-02-20 MED ORDER — POLYETHYLENE GLYCOL 3350 17 G PO PACK: 17.0000 g | PACK | Freq: Every day | ORAL | Status: DC | PRN

## 2022-02-20 MED ORDER — ATORVASTATIN CALCIUM 80 MG PO TABS
80.0000 mg | ORAL_TABLET | Freq: Every day | ORAL | Status: DC
  Administered 2022-02-21: 80 mg via ORAL
  Filled 2022-02-20: qty 1

## 2022-02-20 MED ORDER — PANTOPRAZOLE SODIUM 40 MG PO TBEC
40.0000 mg | DELAYED_RELEASE_TABLET | Freq: Every day | ORAL | Status: DC
  Administered 2022-02-20 – 2022-02-21 (×2): 40 mg via ORAL
  Filled 2022-02-20 (×2): qty 1

## 2022-02-20 MED ORDER — VITAMIN B-12 1000 MCG PO TABS
1000.0000 ug | ORAL_TABLET | Freq: Every day | ORAL | Status: DC
  Administered 2022-02-21: 1000 ug via ORAL
  Filled 2022-02-20: qty 1

## 2022-02-20 MED ORDER — DICLOFENAC SODIUM 75 MG PO TBEC
75.0000 mg | DELAYED_RELEASE_TABLET | Freq: Two times a day (BID) | ORAL | Status: DC
  Administered 2022-02-21 (×2): 75 mg via ORAL
  Filled 2022-02-20 (×4): qty 1

## 2022-02-20 MED ORDER — ASPIRIN EC 81 MG PO TBEC
81.0000 mg | DELAYED_RELEASE_TABLET | Freq: Every day | ORAL | Status: DC
  Administered 2022-02-21: 81 mg via ORAL
  Filled 2022-02-20: qty 1

## 2022-02-20 MED ORDER — ACETAMINOPHEN 325 MG PO TABS: 650.0000 mg | ORAL_TABLET | Freq: Four times a day (QID) | ORAL | Status: DC | PRN

## 2022-02-20 MED ORDER — METOPROLOL SUCCINATE ER 25 MG PO TB24
25.0000 mg | ORAL_TABLET | Freq: Every day | ORAL | Status: DC
  Administered 2022-02-21: 25 mg via ORAL
  Filled 2022-02-20: qty 1

## 2022-02-20 MED ORDER — ACAMPROSATE CALCIUM 333 MG PO TBEC
333.0000 mg | DELAYED_RELEASE_TABLET | Freq: Two times a day (BID) | ORAL | Status: DC
  Administered 2022-02-20 – 2022-02-21 (×2): 333 mg via ORAL
  Filled 2022-02-20 (×4): qty 1

## 2022-02-20 MED ORDER — ACETAMINOPHEN 650 MG RE SUPP
650.0000 mg | Freq: Four times a day (QID) | RECTAL | Status: DC | PRN
Start: 2022-02-20 — End: 2022-02-21

## 2022-02-20 MED ORDER — SODIUM ZIRCONIUM CYCLOSILICATE 10 G PO PACK
10.0000 g | PACK | Freq: Once | ORAL | Status: AC
  Administered 2022-02-20: 10 g via ORAL
  Filled 2022-02-20: qty 1

## 2022-02-20 MED ORDER — MAGNESIUM OXIDE -MG SUPPLEMENT 400 (240 MG) MG PO TABS
400.0000 mg | ORAL_TABLET | Freq: Two times a day (BID) | ORAL | Status: DC
  Administered 2022-02-20 – 2022-02-21 (×2): 400 mg via ORAL
  Filled 2022-02-20 (×2): qty 1
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
## 2022-02-20 NOTE — ED Triage Notes (Addendum)
Patient BIB [facility] from [facility] for being unresponsive. Per EMS patient was seen normal last night around 9 pm. Pt was found unresponsive laying in bed by EMS and was altered from nursing facility an hour ago. Patient only responsive to painful stimuli during EMS transport however upon arrival patient is Aox4. Patient does report that he did fall out bed this morning. Denies a blood thinner. No obvious injuries noted.  ? ?VSS  ?CBG 163  ?

## 2022-02-20 NOTE — ED Notes (Signed)
Patient given urinal; reminded patient several times for urine specimen. Patient acknowledged this. ?

## 2022-02-20 NOTE — ED Notes (Signed)
Got patient undreseed on the monitor did ekg shown to Dr Posey Rea patient is resting with nuRSE At bedside ?

## 2022-02-20 NOTE — Progress Notes (Signed)
PHARMACIST - PHYSICIAN COMMUNICATION ? ?CONCERNING:  Enoxaparin (Lovenox) for DVT Prophylaxis  ? ? ?RECOMMENDATION: ?Patient was prescribed enoxaprin 40mg  q24 hours for VTE prophylaxis.  ? Danley Danker Weights  ? 02/20/22 1437  ?Weight: 127 kg (279 lb 15.8 oz)  ? ? ?Body mass index is 36.94 kg/m?. ? ?Estimated Creatinine Clearance: 110.8 mL/min (by C-G formula based on SCr of 0.99 mg/dL). ? ? ?Based on [place] patient is candidate for enoxaparin 0.5mg /kg TBW SQ every 24 hours based on BMI being >30. ? ?DESCRIPTION: ?Pharmacy has adjusted enoxaparin dose per [facility] policy. ? ?Patient is now receiving enoxaparin 60 mg every 24 hours  ? ? ?Berta Minor, PharmD ?Clinical Pharmacist  ?02/20/2022 ?5:53 PM ? ?
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
## 2022-02-20 NOTE — Consult Note (Signed)
WOC Nurse Consult Note: ?Reason for Consult: Left plantar foot wound, full thickness.  Patient has been seen and followed  by the outpatient wound care  center Allen Derry, III PA-C), but at last appointment on 01/27/22, Mr. Larina Bras referred the patient to Podiatric Medicine (Dr. Logan Bores).  I do not see that a visit has been made to [facility] to see Dr. Logan Bores. ?Wound type:Full thickness. Neuropathic in the presence of Charcot foot deformity.Chronic, non healing. ?Pressure Injury POA: Yes/No/NA ?Measurement:Last measurement on 01/27/22 was 5cm x 5.2cm x 0.8cm ?Wound bed:Red, moist ?Drainage (amount, consistency, odor) Moderate serous ?Periwound: callous ?Dressing procedure/placement/frequency: The outpatient wound care center was using an antimicrobial absorbent dressing (Hydrofera Blue). That product is not available in house, so I will substitute another antimicrobial absorbent, Aquacel Ag+ Advantage, a silver hydrofiber. The patient has no allergies to silver on his EHR. Orders are provided for daily dressings to be performed while in house by Nursing. ? ?Recommend follow up to ensure that patient makes and keeps and appointment to see the Provider that the outpatient [facility] has recommended for the complex condition of Charcot foot deformity. ? ?WOC nursing team will not follow, but will remain available to this patient, the nursing and medical teams.  Please re-consult if needed. ?Thanks, ?Ladona Mow, MSN, RN, GNP, CWOCN, CWON-AP, FAAN  ?Pager# [phone]  ? ? ? ? ?  ?
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
## 2022-02-21 ENCOUNTER — Encounter (HOSPITAL_COMMUNITY): Payer: Self-pay | Admitting: Internal Medicine

## 2022-02-21 DIAGNOSIS — G9341 Metabolic encephalopathy: Secondary | ICD-10-CM | POA: Diagnosis not present

## 2022-02-21 LAB — RAPID URINE DRUG SCREEN, HOSP PERFORMED
Amphetamines: NOT DETECTED
Barbiturates: NOT DETECTED
Benzodiazepines: POSITIVE — AB
Cocaine: NOT DETECTED
Opiates: NOT DETECTED
Tetrahydrocannabinol: POSITIVE — AB

## 2022-02-21 LAB — URINALYSIS, ROUTINE W REFLEX MICROSCOPIC
Bilirubin Urine: NEGATIVE
Glucose, UA: NEGATIVE mg/dL
Hgb urine dipstick: NEGATIVE
Ketones, ur: 5 mg/dL — AB
Leukocytes,Ua: NEGATIVE
Nitrite: NEGATIVE
Protein, ur: NEGATIVE mg/dL
Specific Gravity, Urine: 1.012 (ref 1.005–1.030)
pH: 7 (ref 5.0–8.0)

## 2022-02-21 LAB — CBC
HCT: 29.6 % — ABNORMAL LOW (ref 39.0–52.0)
Hemoglobin: 9.4 g/dL — ABNORMAL LOW (ref 13.0–17.0)
MCH: 31.8 pg (ref 26.0–34.0)
MCHC: 31.8 g/dL (ref 30.0–36.0)
MCV: 100 fL (ref 80.0–100.0)
Platelets: 221 10*3/uL (ref 150–400)
RBC: 2.96 MIL/uL — ABNORMAL LOW (ref 4.22–5.81)
RDW: 15.9 % — ABNORMAL HIGH (ref 11.5–15.5)
WBC: 4.9 10*3/uL (ref 4.0–10.5)
nRBC: 0 % (ref 0.0–0.2)

## 2022-02-21 LAB — BASIC METABOLIC PANEL
Anion gap: 6 (ref 5–15)
BUN: 5 mg/dL — ABNORMAL LOW (ref 6–20)
CO2: 22 mmol/L (ref 22–32)
Calcium: 8.9 mg/dL (ref 8.9–10.3)
Chloride: 110 mmol/L (ref 98–111)
Creatinine, Ser: 0.8 mg/dL (ref 0.61–1.24)
GFR, Estimated: >60 mL/min (ref >60)
Glucose, Bld: 97 mg/dL (ref 70–99)
Potassium: 4 mmol/L (ref 3.5–5.1)
Sodium: 138 mmol/L (ref 135–145)

## 2022-02-21 LAB — PROCALCITONIN: Procalcitonin: <0.1 ng/mL

## 2022-02-21 LAB — MRSA NEXT GEN BY PCR, NASAL: MRSA by PCR Next Gen: NOT DETECTED

## 2022-02-21 LAB — GLUCOSE, CAPILLARY
Glucose-Capillary: 107 mg/dL — ABNORMAL HIGH (ref 70–99)
Glucose-Capillary: 62 mg/dL — ABNORMAL LOW (ref 70–99)
Glucose-Capillary: 86 mg/dL (ref 70–99)
Glucose-Capillary: 95 mg/dL (ref 70–99)
Glucose-Capillary: 97 mg/dL (ref 70–99)

## 2022-02-21 MED ORDER — FUROSEMIDE 40 MG PO TABS: 40.0000 mg | ORAL_TABLET | Freq: Every day | ORAL | Status: DC

## 2022-02-21 NOTE — Discharge Instructions (Signed)

## 2022-02-21 NOTE — TOC Transition Note (Signed)
Transition of Care (TOC) - CM/SW Discharge Note ? ? ?Patient Details  ?Name: [name] ?MRN: [id number] ?Date of Birth: [birth year] ? ?Transition of Care (TOC) CM/SW Contact:  ?Shaleigh Laubscher, [place], [place] ?Phone Number: ?02/21/2022, 12:10 PM ? ? ?Clinical Narrative:    ?Patient to return to [facility]. Patient to be transported by [facility]. Patient will be going to room 123B. RN to call in report to [phone]. ? ?[employer], LCSW ?Transition of Care ?[phone] ? ? ? ?  ?Barriers to Discharge: Continued Medical Work up ? ? ?Patient Goals and CMS Choice ?  ?  ?  ? ?Discharge Placement ?  ?           ?  ?  ?  ?  ? ?Discharge Plan and Services ?In-house Referral: Clinical Social Work ?  ?Post Acute Care Choice: Skilled Nursing Facility          ?  ?  ?  ?  ?  ?  ?  ?  ?  ?  ? ?Social Determinants of Health (SDOH) Interventions ?  ? ? ?Readmission Risk Interventions ? ?  11/16/2021  ? 12:41 PM 03/04/2020  ?  1:26 PM  ?Readmission Risk Prevention Plan  ?Transportation Screening Complete Complete  ?PCP or Specialist Appt within 3-5 Days Complete Complete  ?HRI or Home Care Consult Complete Complete  ?Social Work Consult for Recovery Care Planning/Counseling Complete Complete  ?Palliative Care Screening Complete Complete  ?Medication Review Oceanographer) Complete Complete  ? ? ? ? ? ?

## 2022-02-21 NOTE — Progress Notes (Signed)
Called report to facility. Spoke to [employer], Charity fundraiser. No questions at this time. ?

## 2022-02-21 NOTE — TOC Progression Note (Signed)
Transition of Care (TOC) - Progression Note  ? ? ?Patient Details  ?Name: [name] ?MRN: [id number] ?Date of Birth: Jun 06, 1961 ? ?Transition of Care (TOC) CM/SW Contact  ?[place], [place],  ?Phone Number: ?02/21/2022, 9:03 AM ? ?Clinical Narrative:    ? ?Phone call to Juliann Pulse at [facility] to confirm patient's return today. She will confirm and call this social worker back with at room number. ? ?[employer], LCSW ?Transition of Care ?[phone] ? ?Expected Discharge Plan: [place] ?Barriers to Discharge: Continued Medical Work up ? ?Expected Discharge Plan and Services ?Expected Discharge Plan: [place] ?In-house Referral: Clinical Social Work ?  ?Post Acute Care Choice: [place] ?Living arrangements for the past 2 months: [place] ?                ?  ?  ?  ?  ?  ?  ?  ?  ?  ?  ? ? ?Social Determinants of Health (SDOH) Interventions ?  ? ?Readmission Risk Interventions ? ?  11/16/2021  ? 12:41 PM 03/04/2020  ?  1:26 PM  ?Readmission Risk Prevention Plan  ?Transportation Screening Complete Complete  ?PCP or Specialist Appt within 3-5 Days Complete Complete  ?[place] or Home Care Consult Complete Complete  ?Social Work Consult for [place] Planning/Counseling Complete Complete  ?Palliative Care Screening Complete Complete  ?Medication Review Press photographer) Complete Complete  ? ? ?

## 2022-02-21 NOTE — Progress Notes (Signed)
PT Cancellation Note ? ?Patient Details ?Name: [name] ?MRN: [id number] ?DOB: 1961/03/29 ? ? ?Cancelled Treatment:    Reason Eval/Treat Not Completed: Other (comment) ? ?Orders received, chart reviewed;  ?Noted for dc back to SNF today;  ?Discussed with [place], LCSW, who indicated no need for acte PT eval at this time;  ? ?Will sign off,  ? ?Van Clines, PT  ?Acute Rehabilitation Services ?Office [phone] ? ? ?[name] ?02/21/2022, 1:15 PM ?
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
## 2022-02-22 ENCOUNTER — Telehealth: Payer: Self-pay | Admitting: *Deleted

## 2022-02-22 ENCOUNTER — Telehealth: Payer: Self-pay | Admitting: Internal Medicine

## 2022-02-22 ENCOUNTER — Encounter: Payer: Self-pay | Admitting: *Deleted

## 2022-02-22 ENCOUNTER — Telehealth: Payer: Medicare Other

## 2022-02-22 NOTE — Telephone Encounter (Signed)
Patient needs a referral for a sleep study - based on Notes from [facility] - please advise ?

## 2022-02-22 NOTE — Telephone Encounter (Signed)
?  Chronic Care Management  ? ?Follow Up Note ? ? ?02/22/2022 ?Name: [name] MRN: [id number] DOB: March 28, 1961 ? ?Referred by: Binnie Rail, MD ?Reason for referral : Chronic Care Management (CCM RN CM Follow up telephone outreach; CHF; pre-DM; unsuccessful outreach) ? ?An unsuccessful telephone outreach was attempted today. The patient was referred to the case management team for assistance with care management and care coordination.  ? ?Received automated outgoing voice message stating that patient is not available and has a voicemail box that is full; unable to leave voice message requesting call-back ? ?Follow Up Plan:  ?Will place request with scheduling care guide to contact patient to re-schedule today's missed CCM RN follow up telephone appointment if I do not hear back from patient by end of day ? ?Oneta Rack, RN, BSN, CCRN Alumnus ?[place] ?([phone]: direct office ? ? ?

## 2022-02-23 ENCOUNTER — Encounter: Payer: Self-pay | Admitting: Physician Assistant

## 2022-02-23 NOTE — Telephone Encounter (Signed)
Spoke with patient today. 

## 2022-02-23 NOTE — Telephone Encounter (Signed)
I have placed a referral to pulmonary.  They called him 4/27 and he deferred the referral.  They did advise him that he can call them if he wishes to make an appointment-here is the number he needs to call them. ? ? ?Call [facility] at Phone: [phone]  ?

## 2022-02-28 ENCOUNTER — Inpatient Hospital Stay (HOSPITAL_COMMUNITY)
Admission: EM | Admit: 2022-02-28 | Discharge: 2022-03-03 | DRG: 092 | Disposition: A | Discharge status: Skilled Nursing Facility | Payer: Medicare Other | Source: Skilled Nursing Facility | Attending: Family Medicine | Admitting: Family Medicine

## 2022-02-28 ENCOUNTER — Emergency Department (HOSPITAL_COMMUNITY): Payer: Medicare Other

## 2022-02-28 ENCOUNTER — Encounter (HOSPITAL_COMMUNITY): Payer: Self-pay | Admitting: Internal Medicine

## 2022-02-28 DIAGNOSIS — Z6837 Body mass index (BMI) 37.0-37.9, adult: Secondary | ICD-10-CM

## 2022-02-28 DIAGNOSIS — I872 Venous insufficiency (chronic) (peripheral): Secondary | ICD-10-CM

## 2022-02-28 DIAGNOSIS — L97522 Non-pressure chronic ulcer of other part of left foot with fat layer exposed: Secondary | ICD-10-CM | POA: Diagnosis not present

## 2022-02-28 DIAGNOSIS — I10 Essential (primary) hypertension: Secondary | ICD-10-CM | POA: Diagnosis present

## 2022-02-28 DIAGNOSIS — Z96653 Presence of artificial knee joint, bilateral: Secondary | ICD-10-CM | POA: Diagnosis present

## 2022-02-28 DIAGNOSIS — E119 Type 2 diabetes mellitus without complications: Secondary | ICD-10-CM | POA: Diagnosis present

## 2022-02-28 DIAGNOSIS — D649 Anemia, unspecified: Secondary | ICD-10-CM | POA: Diagnosis present

## 2022-02-28 DIAGNOSIS — R400 Somnolence: Principal | ICD-10-CM

## 2022-02-28 DIAGNOSIS — L97509 Non-pressure chronic ulcer of other part of unspecified foot with unspecified severity: Secondary | ICD-10-CM

## 2022-02-28 DIAGNOSIS — G928 Other toxic encephalopathy: Secondary | ICD-10-CM | POA: Diagnosis not present

## 2022-02-28 DIAGNOSIS — I110 Hypertensive heart disease with heart failure: Secondary | ICD-10-CM | POA: Diagnosis present

## 2022-02-28 DIAGNOSIS — K219 Gastro-esophageal reflux disease without esophagitis: Secondary | ICD-10-CM | POA: Diagnosis present

## 2022-02-28 DIAGNOSIS — Z888 Allergy status to other drugs, medicaments and biological substances status: Secondary | ICD-10-CM

## 2022-02-28 DIAGNOSIS — I5032 Chronic diastolic (congestive) heart failure: Secondary | ICD-10-CM

## 2022-02-28 DIAGNOSIS — E785 Hyperlipidemia, unspecified: Secondary | ICD-10-CM | POA: Diagnosis present

## 2022-02-28 DIAGNOSIS — K760 Fatty (change of) liver, not elsewhere classified: Secondary | ICD-10-CM | POA: Diagnosis present

## 2022-02-28 DIAGNOSIS — Z89421 Acquired absence of other right toe(s): Secondary | ICD-10-CM

## 2022-02-28 DIAGNOSIS — E11610 Type 2 diabetes mellitus with diabetic neuropathic arthropathy: Secondary | ICD-10-CM | POA: Diagnosis present

## 2022-02-28 DIAGNOSIS — Z7982 Long term (current) use of aspirin: Secondary | ICD-10-CM

## 2022-02-28 DIAGNOSIS — L97429 Non-pressure chronic ulcer of left heel and midfoot with unspecified severity: Secondary | ICD-10-CM | POA: Diagnosis present

## 2022-02-28 DIAGNOSIS — G9341 Metabolic encephalopathy: Secondary | ICD-10-CM

## 2022-02-28 DIAGNOSIS — E662 Morbid (severe) obesity with alveolar hypoventilation: Secondary | ICD-10-CM

## 2022-02-28 DIAGNOSIS — E876 Hypokalemia: Secondary | ICD-10-CM | POA: Diagnosis present

## 2022-02-28 DIAGNOSIS — E11621 Type 2 diabetes mellitus with foot ulcer: Secondary | ICD-10-CM

## 2022-02-28 DIAGNOSIS — E1142 Type 2 diabetes mellitus with diabetic polyneuropathy: Secondary | ICD-10-CM | POA: Diagnosis present

## 2022-02-28 DIAGNOSIS — Z87891 Personal history of nicotine dependence: Secondary | ICD-10-CM

## 2022-02-28 DIAGNOSIS — Z96643 Presence of artificial hip joint, bilateral: Secondary | ICD-10-CM | POA: Diagnosis present

## 2022-02-28 DIAGNOSIS — E782 Mixed hyperlipidemia: Secondary | ICD-10-CM

## 2022-02-28 DIAGNOSIS — G8929 Other chronic pain: Secondary | ICD-10-CM | POA: Diagnosis present

## 2022-02-28 DIAGNOSIS — I5042 Chronic combined systolic (congestive) and diastolic (congestive) heart failure: Secondary | ICD-10-CM | POA: Diagnosis present

## 2022-02-28 DIAGNOSIS — Z79899 Other long term (current) drug therapy: Secondary | ICD-10-CM

## 2022-02-28 DIAGNOSIS — Z89422 Acquired absence of other left toe(s): Secondary | ICD-10-CM

## 2022-02-28 DIAGNOSIS — G4733 Obstructive sleep apnea (adult) (pediatric): Secondary | ICD-10-CM

## 2022-02-28 HISTORY — DX: Alcohol use, unspecified with withdrawal, uncomplicated: F10.930

## 2022-02-28 LAB — COMPREHENSIVE METABOLIC PANEL
ALT: 24 U/L (ref 0–44)
AST: 46 U/L — ABNORMAL HIGH (ref 15–41)
Albumin: 3.2 g/dL — ABNORMAL LOW (ref 3.5–5.0)
Alkaline Phosphatase: 219 U/L — ABNORMAL HIGH (ref 38–126)
Anion gap: 8 (ref 5–15)
BUN: 11 mg/dL (ref 6–20)
CO2: 21 mmol/L — ABNORMAL LOW (ref 22–32)
Calcium: 9.5 mg/dL (ref 8.9–10.3)
Chloride: 109 mmol/L (ref 98–111)
Creatinine, Ser: 1.05 mg/dL (ref 0.61–1.24)
GFR, Estimated: >60 mL/min (ref >60)
Glucose, Bld: 83 mg/dL (ref 70–99)
Potassium: 4.2 mmol/L (ref 3.5–5.1)
Sodium: 138 mmol/L (ref 135–145)
Total Bilirubin: 1.3 mg/dL — ABNORMAL HIGH (ref 0.3–1.2)
Total Protein: 7.5 g/dL (ref 6.5–8.1)

## 2022-02-28 LAB — I-STAT ARTERIAL BLOOD GAS, ED
Acid-base deficit: 2 mmol/L (ref 0.0–2.0)
Bicarbonate: 22.4 mmol/L (ref 20.0–28.0)
Calcium, Ion: 1.34 mmol/L (ref 1.15–1.40)
HCT: 29 % — ABNORMAL LOW (ref 39.0–52.0)
Hemoglobin: 9.9 g/dL — ABNORMAL LOW (ref 13.0–17.0)
O2 Saturation: 96 %
Patient temperature: 98.4
Potassium: 3.7 mmol/L (ref 3.5–5.1)
Sodium: 141 mmol/L (ref 135–145)
TCO2: 24 mmol/L (ref 22–32)
pCO2 arterial: 36.2 mmHg (ref 32–48)
pH, Arterial: 7.4 (ref 7.35–7.45)
pO2, Arterial: 84 mmHg (ref 83–108)

## 2022-02-28 LAB — I-STAT VENOUS BLOOD GAS, ED
Acid-base deficit: 1 mmol/L (ref 0.0–2.0)
Bicarbonate: 21.6 mmol/L (ref 20.0–28.0)
Calcium, Ion: 1.14 mmol/L — ABNORMAL LOW (ref 1.15–1.40)
HCT: 31 % — ABNORMAL LOW (ref 39.0–52.0)
Hemoglobin: 10.5 g/dL — ABNORMAL LOW (ref 13.0–17.0)
O2 Saturation: 89 %
Potassium: 4 mmol/L (ref 3.5–5.1)
Sodium: 139 mmol/L (ref 135–145)
TCO2: 22 mmol/L (ref 22–32)
pCO2, Ven: 29 mmHg — ABNORMAL LOW (ref 44–60)
pH, Ven: 7.48 — ABNORMAL HIGH (ref 7.25–7.43)
pO2, Ven: 51 mmHg — ABNORMAL HIGH (ref 32–45)

## 2022-02-28 LAB — SALICYLATE LEVEL: Salicylate Lvl: <7 mg/dL — ABNORMAL LOW (ref 7.0–30.0)

## 2022-02-28 LAB — ACETAMINOPHEN LEVEL: Acetaminophen (Tylenol), Serum: <10 ug/mL — ABNORMAL LOW (ref 10–30)

## 2022-02-28 LAB — CBC WITH DIFFERENTIAL/PLATELET
Abs Immature Granulocytes: 0.02 10*3/uL (ref 0.00–0.07)
Basophils Absolute: 0.1 10*3/uL (ref 0.0–0.1)
Basophils Relative: 1 %
Eosinophils Absolute: 0.1 10*3/uL (ref 0.0–0.5)
Eosinophils Relative: 1 %
HCT: 32.3 % — ABNORMAL LOW (ref 39.0–52.0)
Hemoglobin: 10.7 g/dL — ABNORMAL LOW (ref 13.0–17.0)
Immature Granulocytes: 1 %
Lymphocytes Relative: 23 %
Lymphs Abs: 1 10*3/uL (ref 0.7–4.0)
MCH: 32.6 pg (ref 26.0–34.0)
MCHC: 33.1 g/dL (ref 30.0–36.0)
MCV: 98.5 fL (ref 80.0–100.0)
Monocytes Absolute: 0.4 10*3/uL (ref 0.1–1.0)
Monocytes Relative: 10 %
Neutro Abs: 2.9 10*3/uL (ref 1.7–7.7)
Neutrophils Relative %: 64 %
Platelets: 232 10*3/uL (ref 150–400)
RBC: 3.28 MIL/uL — ABNORMAL LOW (ref 4.22–5.81)
RDW: 15.3 % (ref 11.5–15.5)
WBC: 4.4 10*3/uL (ref 4.0–10.5)
nRBC: 0 % (ref 0.0–0.2)

## 2022-02-28 LAB — AMMONIA: Ammonia: 29 umol/L (ref 9–35)

## 2022-02-28 LAB — MAGNESIUM: Magnesium: 1.9 mg/dL (ref 1.7–2.4)

## 2022-02-28 LAB — TROPONIN I (HIGH SENSITIVITY)
Troponin I (High Sensitivity): 5 ng/L (ref <18)
Troponin I (High Sensitivity): 5 ng/L (ref <18)

## 2022-02-28 LAB — CBG MONITORING, ED: Glucose-Capillary: 77 mg/dL (ref 70–99)

## 2022-02-28 LAB — LACTIC ACID, PLASMA: Lactic Acid, Venous: 1.4 mmol/L (ref 0.5–1.9)

## 2022-02-28 LAB — ETHANOL: Alcohol, Ethyl (B): <10 mg/dL (ref <10)

## 2022-02-28 MED ORDER — LACTATED RINGERS IV SOLN: INTRAVENOUS | Status: DC

## 2022-02-28 MED ORDER — NALOXONE HCL 0.4 MG/ML IJ SOLN
0.4000 mg | Freq: Once | INTRAMUSCULAR | Status: AC
  Administered 2022-02-28: 0.4 mg via INTRAVENOUS
  Filled 2022-02-28: qty 1

## 2022-02-28 NOTE — ED Notes (Signed)
RN contacted respiratory as Pt's O2 was 90%.  Respiratory bedside, adjusting settings.  RN will continue to monitor. ?

## 2022-02-28 NOTE — Assessment & Plan Note (Addendum)
-   Hemoglobin A1c was 5.0 (11/15/2021). ?-CBG 114 this morning.   ?-Change sliding scale insulin to 3 times daily with meals.   ?-Change CBGs to before meals and at bedtime.   ?-If blood glucose remains well controlled over the next 24 to 48 hours could likely discontinue sliding scale insulin.   ?-Supportive care.  ?

## 2022-02-28 NOTE — ED Notes (Addendum)
Assumed care of pt.  He is only responsive to painful stimuli, provider aware.  He is maintaing his airway on Bipap.  RN will continue to monitor.   ?

## 2022-02-28 NOTE — Assessment & Plan Note (Addendum)
Chronic wound on bottom of left foot. See pictures. No surrounding erythema or purulent drainage. No indication for abx. ?-Wound care consult. ?-Outpatient follow-up with wound care center. ? ? ? ? ?

## 2022-02-28 NOTE — Assessment & Plan Note (Addendum)
Chronic. ?-Lifestyle modification. ?-Outpatient follow-up with PCP. ?

## 2022-02-28 NOTE — Assessment & Plan Note (Addendum)
Stable. ?-BiPAP nightly ordered however noted patient refused. ?-Discontinue BiPAP. ?-We will need outpatient sleep study. ?

## 2022-02-28 NOTE — ED Triage Notes (Signed)
Pt arrived via EMS from home due to concerns of hallucinations, pt 2 weeks out of ETOH rehab ?

## 2022-02-28 NOTE — Assessment & Plan Note (Addendum)
Chronic appearing venous stasis. No indication for abx. ?Wound care consult. ?We will need outpatient follow-up at the wound care center on discharge. ?-Patient noted to have been on Augmentin and Bactrim prophylactically prior to admission which have been resumed. ? ? ? ? ? ? ?

## 2022-02-28 NOTE — ED Provider Notes (Signed)
?[facility] EMERGENCY DEPARTMENT ?Provider Note ? ? ?CSN: [id number] ?Arrival date & time: 02/28/22  1729 ? ?  ? ?History ? ?Chief Complaint  ?Patient presents with  ? Hallucinations  ?  VISUAL  ? ? ?[name] is a 61 y.o. male. ? ?HPI ?Patient presents for hallucinations.  His medical history includes HTN, OSA, HLD, T2DM, CHF, chronic pain, and prior alcoholism.  He was previously admitted to the hospital 1 week ago for encephalopathy.  At his time of admission 1 week ago, he had severe somnolence.  While sleeping, his SPO2 would drop.  He did not have hypercarbia on blood gas.  He did have some response to Narcan.  He was admitted and did have resolution of his somnolence.  He was discharged to his current residence at [facility].  At this facility, patient does manage his own medications.  At this time of recent discharge, multiple home medications were discontinued.  This was in an effort to minimize his somnolence and avoid hyperkalemia.  1 of those medications that was discontinued was trazodone.  Earlier today, patient significant other came to get him from the facility.  When she found him there, he was slumped over in a chair, sleeping and difficult to arouse.  When aroused, he would fall asleep again.  She did state that he had a bottle of trazodone on his person.  She states that he had accumulated 3 bottles of trazodone and she feels that he may have taken this medications.  She is not aware of any opiate use.  She does not feel that he would take medications to harm himself.  Patient does, however, deal with chronic pain.  She feels that he may have taken medication to treat his pain and/or help him sleep.  She was able to get him home.  They were doing laundry and patient remained somnolent and was speaking nonsensically at times.  For this reason, she called EMS.  On patient's arrival, he states that he feels "lethargic".  History is limited by his somnolence, as  he often falls asleep in the middle of the conversation.  He denies any current areas of discomfort.  He denies taking any medications prior to arrival. ? ?  ? ?Home Medications ?Prior to Admission medications   ?Medication Sig Start Date End Date Taking? Authorizing Provider  ?acamprosate (CAMPRAL) 333 MG tablet Take 333 mg by mouth 2 (two) times daily. ?Patient not taking: Reported on 02/20/2022 04/01/21   [provider]  ?amLODipine (NORVASC) 5 MG tablet Take 5 mg by mouth daily.    [provider]  ?aspirin EC 81 MG EC tablet Take 1 tablet (81 mg total) by mouth daily. Swallow whole. 11/20/21   Rodolph Bong, MD  ?atorvastatin (LIPITOR) 80 MG tablet Take 1 tablet (80 mg total) by mouth daily. ?Patient not taking: Reported on 02/20/2022 11/26/21 11/26/22  Rodolph Bong, MD  ?folic acid (FOLVITE) 1 MG tablet TAKE 1 TABLET BY MOUTH EVERY DAY ?Patient taking differently: Take 1 mg by mouth daily. 12/03/21   [name], MD  ?furosemide (LASIX) 40 MG tablet Take 1 tablet (40 mg total) by mouth daily. 02/21/22   Hongalgi, Maximino Greenland, MD  ?Magnesium 400 MG TABS Take 400 mg by mouth daily. ?Patient not taking: Reported on 02/20/2022 11/29/21   [name], MD  ?metoprolol succinate (TOPROL-XL) 25 MG 24 hr tablet Take 1 tablet (25 mg total) by mouth daily. ?Patient not  taking: Reported on 02/20/2022 11/20/21   Rodolph Bong, MD  ?nitroGLYCERIN (NITROSTAT) 0.4 MG SL tablet Place 1 tablet (0.4 mg total) under the tongue every 5 (five) minutes x 3 doses as needed for chest pain. ?Patient not taking: Reported on 02/20/2022 11/19/21   Rodolph Bong, MD  ?thiamine (VITAMIN B-1) 100 MG tablet Take 100 mg by mouth daily. ?Patient not taking: Reported on 02/20/2022    [provider]  ?vitamin B-12 (CYANOCOBALAMIN) 100 MCG tablet Take 100 mcg by mouth daily. ?Patient not taking: Reported on 02/20/2022    [provider]  ?   ? ?Allergies    ?Claritin [loratadine]   ? ?Review of Systems   ?Review of  Systems  ?Constitutional:  Positive for fatigue.  ?Psychiatric/Behavioral:  Positive for confusion.   ?All other systems reviewed and are negative. ? ?Physical Exam ?Updated Vital Signs ?BP 101/62   Pulse 72   Temp 98.4 ?F (36.9 ?C) (Oral)   Resp 18   SpO2 90%  ?Physical Exam ?Vitals and nursing note reviewed.  ?Constitutional:   ?   General: He is not in acute distress. ?   Appearance: He is well-developed. He is obese. He is not toxic-appearing or diaphoretic.  ?HENT:  ?   Head: Normocephalic.  ?   Comments: Small area of linear scabbing on right frontal scalp.  Patient denies any recent head trauma. ?   Right Ear: External ear normal.  ?   Left Ear: External ear normal.  ?   Nose: Nose normal.  ?   Mouth/Throat:  ?   Mouth: Mucous membranes are moist.  ?   Pharynx: Oropharynx is clear.  ?Eyes:  ?   Extraocular Movements: Extraocular movements intact.  ?   Conjunctiva/sclera: Conjunctivae normal.  ?Cardiovascular:  ?   Rate and Rhythm: Normal rate and regular rhythm.  ?   Heart sounds: No murmur heard. ?Pulmonary:  ?   Effort: Pulmonary effort is normal. No respiratory distress.  ?   Breath sounds: Normal breath sounds. No wheezing or rales.  ?Chest:  ?   Chest wall: No tenderness.  ?Abdominal:  ?   Palpations: Abdomen is soft.  ?   Tenderness: There is no abdominal tenderness. There is no guarding.  ?Musculoskeletal:     ?   General: No swelling.  ?   Cervical back: Neck supple. No rigidity or tenderness.  ?   Right lower leg: Edema present.  ?   Left lower leg: Edema present.  ?   Comments: Bilateral distal lower extremity swelling and erythema.  Granulomatous wound on plantar aspect of left foot.  ?Skin: ?   General: Skin is warm and dry.  ?   Capillary Refill: Capillary refill takes less than 2 seconds.  ?   Coloration: Skin is not jaundiced or pale.  ?Neurological:  ?   Mental Status: He is alert.  ?   GCS: GCS eye subscore is 2. GCS verbal subscore is 5. GCS motor subscore is 6.  ?   Cranial Nerves: No  dysarthria or facial asymmetry.  ?   Sensory: Sensation is intact. No sensory deficit.  ?   Motor: Motor function is intact. No abnormal muscle tone.  ?Psychiatric:     ?   Mood and Affect: Mood normal.  ? ? ? ?ED Results / Procedures / Treatments   ?Labs ?(all labs ordered are listed, but only abnormal results are displayed) ?Labs Reviewed  ?COMPREHENSIVE METABOLIC PANEL - Abnormal; Notable  for the following components:  ?    Result Value  ? CO2 21 (*)   ? Albumin 3.2 (*)   ? AST 46 (*)   ? Alkaline Phosphatase 219 (*)   ? Total Bilirubin 1.3 (*)   ? All other components within normal limits  ?CBC WITH DIFFERENTIAL/PLATELET - Abnormal; Notable for the following components:  ? RBC 3.28 (*)   ? Hemoglobin 10.7 (*)   ? HCT 32.3 (*)   ? All other components within normal limits  ?I-STAT VENOUS BLOOD GAS, ED - Abnormal; Notable for the following components:  ? pH, Ven 7.480 (*)   ? pCO2, Ven 29.0 (*)   ? pO2, Ven 51 (*)   ? Calcium, Ion 1.14 (*)   ? HCT 31.0 (*)   ? Hemoglobin 10.5 (*)   ? All other components within normal limits  ?I-STAT ARTERIAL BLOOD GAS, ED - Abnormal; Notable for the following components:  ? HCT 29.0 (*)   ? Hemoglobin 9.9 (*)   ? All other components within normal limits  ?LACTIC ACID, PLASMA  ?MAGNESIUM  ?URINALYSIS, ROUTINE W REFLEX MICROSCOPIC  ?RAPID URINE DRUG SCREEN, HOSP PERFORMED  ?ETHANOL  ?BLOOD GAS, ARTERIAL  ?AMMONIA  ?ACETAMINOPHEN LEVEL  ?SALICYLATE LEVEL  ?CBG MONITORING, ED  ?TROPONIN I (HIGH SENSITIVITY)  ?TROPONIN I (HIGH SENSITIVITY)  ? ? ?EKG ?EKG Interpretation ? ?Date/Time:  Sunday Feb 28 2022 17:47:03 EDT ?Ventricular Rate:  74 ?PR Interval:  185 ?QRS Duration: 102 ?QT Interval:  411 ?QTC Calculation: 456 ?R Axis:   11 ?Text Interpretation: Sinus rhythm Anterior infarct, old Confirmed by Gloris Manchesterixon, Kaine Mcquillen [phone] on 02/28/2022 6:32:29 PM ? ?Radiology ?CT HEAD WO CONTRAST ? ?Result Date: 02/28/2022 ?CLINICAL DATA:  Altered mental status and hallucinations, initial encounter EXAM: CT  HEAD WITHOUT CONTRAST TECHNIQUE: Contiguous axial images were obtained from the base of the skull through the vertex without intravenous contrast. RADIATION DOSE REDUCTION: This exam was performed according

## 2022-02-28 NOTE — ED Notes (Signed)
Patient transported to CT on cardiac monitor with this RN 

## 2022-02-28 NOTE — Subjective & Objective (Signed)
CC: altered mental status ?HPI: ?61 year old white male history of chronic left foot wound with osteomyelitis, chronic systolic heart failure, OSA, type 2 diabetes, chronic venous insufficiency, hypertension, brought to the ER today by his significant other.  Patient recently discharged back to [place] on 02/21/2022 after spending 48 hours in the hospital for altered mental status. ? ?Reportedly patient was taken out of the facility today by his significant other.  He reportedly had a bottle of trazodone on his person.  Patient had difficulty waking up. ? ?On arrival, patient given 2 rounds of IV Narcan which did not improve his mentation. ? ?Lab work largely unremarkable to the cause of the patient's excessive somnolence. ? ?ABG pH is 7.4, PCO2 36, PO2 84 ?CT head without acute intracranial abnormality. ? ?Due to the patient requiring BiPAP and continue excessive somnolence, [facility] hospitalist contacted for admission. ? ?No family at bedside. ?

## 2022-02-28 NOTE — Assessment & Plan Note (Addendum)
Stable. ?-Resume home regimen Lipitor ?

## 2022-02-28 NOTE — Assessment & Plan Note (Addendum)
-  Blood pressure currently stable.   ?-Patient's antihypertensive medications initially held on admission.   ?-Home regimen Lasix and Toprol-XL resumed.  ?-IV labetalol as needed.  ?

## 2022-02-28 NOTE — Assessment & Plan Note (Addendum)
Patient presented with acute metabolic encephalopathy/somnolence per significant other.  -Status post IV Narcan x2 rounds with no significant improvement on presentation.   ?-No signs or symptoms of infection.   ?-CT head done unremarkable.   ?-Patient noted to have a bottle of trazodone on his person when significant other took patient out of the SNF.   ?-UDS done positive for benzodiazepines, THC. ?-ABG done on presentation with pH of 7.4/PCO2 of 36/PO2 of 84/bicarb of 22.4. ?-Acetaminophen level < 10, salicylate level , 7. ?-Cardiac enzymes negative. ?-Ammonia level at 29. ?-Concern patient's symptoms likely secondary to trazodone related to excessive sedation. ?-Patient awake, alert, following some commands appropriately. ?-Monitor off BiPAP. ?-O2 titrated currently on room air. ?-BiPAP changed to nightly however noted that patient was refusing. ?-Discontinue BiPAP. ?-Supportive care. ?-Placed on BiPAP nightly. ?-Supportive care. ?

## 2022-02-28 NOTE — ED Triage Notes (Signed)
Pt arrived from [facility] ?

## 2022-02-28 NOTE — Assessment & Plan Note (Addendum)
BiPAP nightly, however patient noted to be refusing. ?Will need outpatient sleep study. ?
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
## 2022-02-28 NOTE — Assessment & Plan Note (Addendum)
Stable. Euvolemic. ?-Resume home regimen Lipitor, Lasix, Toprol-XL. ?-Outpatient follow-up. ?

## 2022-03-01 ENCOUNTER — Other Ambulatory Visit: Payer: Self-pay

## 2022-03-01 ENCOUNTER — Encounter (HOSPITAL_COMMUNITY): Payer: Self-pay | Admitting: Internal Medicine

## 2022-03-01 DIAGNOSIS — Z96643 Presence of artificial hip joint, bilateral: Secondary | ICD-10-CM | POA: Diagnosis present

## 2022-03-01 DIAGNOSIS — Z7982 Long term (current) use of aspirin: Secondary | ICD-10-CM | POA: Diagnosis not present

## 2022-03-01 DIAGNOSIS — E11610 Type 2 diabetes mellitus with diabetic neuropathic arthropathy: Secondary | ICD-10-CM | POA: Diagnosis present

## 2022-03-01 DIAGNOSIS — E785 Hyperlipidemia, unspecified: Secondary | ICD-10-CM | POA: Diagnosis present

## 2022-03-01 DIAGNOSIS — I5032 Chronic diastolic (congestive) heart failure: Secondary | ICD-10-CM | POA: Diagnosis not present

## 2022-03-01 DIAGNOSIS — I110 Hypertensive heart disease with heart failure: Secondary | ICD-10-CM | POA: Diagnosis present

## 2022-03-01 DIAGNOSIS — D649 Anemia, unspecified: Secondary | ICD-10-CM | POA: Diagnosis present

## 2022-03-01 DIAGNOSIS — Z89422 Acquired absence of other left toe(s): Secondary | ICD-10-CM | POA: Diagnosis not present

## 2022-03-01 DIAGNOSIS — I5042 Chronic combined systolic (congestive) and diastolic (congestive) heart failure: Secondary | ICD-10-CM | POA: Diagnosis present

## 2022-03-01 DIAGNOSIS — E782 Mixed hyperlipidemia: Secondary | ICD-10-CM | POA: Diagnosis not present

## 2022-03-01 DIAGNOSIS — G9341 Metabolic encephalopathy: Secondary | ICD-10-CM | POA: Diagnosis not present

## 2022-03-01 DIAGNOSIS — Z79899 Other long term (current) drug therapy: Secondary | ICD-10-CM | POA: Diagnosis not present

## 2022-03-01 DIAGNOSIS — Z6837 Body mass index (BMI) 37.0-37.9, adult: Secondary | ICD-10-CM | POA: Diagnosis not present

## 2022-03-01 DIAGNOSIS — E11621 Type 2 diabetes mellitus with foot ulcer: Secondary | ICD-10-CM | POA: Diagnosis present

## 2022-03-01 DIAGNOSIS — L97522 Non-pressure chronic ulcer of other part of left foot with fat layer exposed: Secondary | ICD-10-CM | POA: Diagnosis not present

## 2022-03-01 DIAGNOSIS — E1142 Type 2 diabetes mellitus with diabetic polyneuropathy: Secondary | ICD-10-CM | POA: Diagnosis present

## 2022-03-01 DIAGNOSIS — G928 Other toxic encephalopathy: Secondary | ICD-10-CM | POA: Diagnosis present

## 2022-03-01 DIAGNOSIS — E876 Hypokalemia: Secondary | ICD-10-CM | POA: Diagnosis present

## 2022-03-01 DIAGNOSIS — L97429 Non-pressure chronic ulcer of left heel and midfoot with unspecified severity: Secondary | ICD-10-CM | POA: Diagnosis present

## 2022-03-01 DIAGNOSIS — I872 Venous insufficiency (chronic) (peripheral): Secondary | ICD-10-CM | POA: Diagnosis present

## 2022-03-01 DIAGNOSIS — R400 Somnolence: Secondary | ICD-10-CM | POA: Diagnosis present

## 2022-03-01 DIAGNOSIS — Z96653 Presence of artificial knee joint, bilateral: Secondary | ICD-10-CM | POA: Diagnosis present

## 2022-03-01 DIAGNOSIS — Z888 Allergy status to other drugs, medicaments and biological substances status: Secondary | ICD-10-CM | POA: Diagnosis not present

## 2022-03-01 DIAGNOSIS — E662 Morbid (severe) obesity with alveolar hypoventilation: Secondary | ICD-10-CM | POA: Diagnosis present

## 2022-03-01 DIAGNOSIS — K760 Fatty (change of) liver, not elsewhere classified: Secondary | ICD-10-CM | POA: Diagnosis present

## 2022-03-01 DIAGNOSIS — Z89421 Acquired absence of other right toe(s): Secondary | ICD-10-CM | POA: Diagnosis not present

## 2022-03-01 DIAGNOSIS — I10 Essential (primary) hypertension: Secondary | ICD-10-CM | POA: Diagnosis not present

## 2022-03-01 DIAGNOSIS — K219 Gastro-esophageal reflux disease without esophagitis: Secondary | ICD-10-CM | POA: Diagnosis present

## 2022-03-01 DIAGNOSIS — Z87891 Personal history of nicotine dependence: Secondary | ICD-10-CM | POA: Diagnosis not present

## 2022-03-01 LAB — URINALYSIS, ROUTINE W REFLEX MICROSCOPIC
Bilirubin Urine: NEGATIVE
Glucose, UA: NEGATIVE mg/dL
Hgb urine dipstick: NEGATIVE
Ketones, ur: NEGATIVE mg/dL
Leukocytes,Ua: NEGATIVE
Nitrite: NEGATIVE
Protein, ur: NEGATIVE mg/dL
Specific Gravity, Urine: 1.013 (ref 1.005–1.030)
pH: 5 (ref 5.0–8.0)

## 2022-03-01 LAB — COMPREHENSIVE METABOLIC PANEL
ALT: 22 U/L (ref 0–44)
ALT: 24 U/L (ref 0–44)
AST: 37 U/L (ref 15–41)
AST: 34 U/L (ref 15–41)
Albumin: 2.9 g/dL — ABNORMAL LOW (ref 3.5–5.0)
Albumin: 2.9 g/dL — ABNORMAL LOW (ref 3.5–5.0)
Alkaline Phosphatase: 215 U/L — ABNORMAL HIGH (ref 38–126)
Alkaline Phosphatase: 185 U/L — ABNORMAL HIGH (ref 38–126)
Anion gap: 7 (ref 5–15)
Anion gap: 9 (ref 5–15)
BUN: 5 mg/dL — ABNORMAL LOW (ref 6–20)
BUN: <5 mg/dL — ABNORMAL LOW (ref 6–20)
CO2: 23 mmol/L (ref 22–32)
CO2: 21 mmol/L — ABNORMAL LOW (ref 22–32)
Calcium: 8.9 mg/dL (ref 8.9–10.3)
Calcium: 8.9 mg/dL (ref 8.9–10.3)
Chloride: 107 mmol/L (ref 98–111)
Chloride: 106 mmol/L (ref 98–111)
Creatinine, Ser: 0.76 mg/dL (ref 0.61–1.24)
Creatinine, Ser: 0.78 mg/dL (ref 0.61–1.24)
GFR, Estimated: >60 mL/min (ref >60)
GFR, Estimated: >60 mL/min (ref >60)
Glucose, Bld: 85 mg/dL (ref 70–99)
Glucose, Bld: 100 mg/dL — ABNORMAL HIGH (ref 70–99)
Potassium: 4.1 mmol/L (ref 3.5–5.1)
Potassium: 3.8 mmol/L (ref 3.5–5.1)
Sodium: 137 mmol/L (ref 135–145)
Sodium: 136 mmol/L (ref 135–145)
Total Bilirubin: 1.4 mg/dL — ABNORMAL HIGH (ref 0.3–1.2)
Total Bilirubin: 1.6 mg/dL — ABNORMAL HIGH (ref 0.3–1.2)
Total Protein: 6.8 g/dL (ref 6.5–8.1)
Total Protein: 7.2 g/dL (ref 6.5–8.1)

## 2022-03-01 LAB — CBG MONITORING, ED
Glucose-Capillary: 64 mg/dL — ABNORMAL LOW (ref 70–99)
Glucose-Capillary: 95 mg/dL (ref 70–99)

## 2022-03-01 LAB — RAPID URINE DRUG SCREEN, HOSP PERFORMED
Amphetamines: NOT DETECTED
Barbiturates: NOT DETECTED
Benzodiazepines: POSITIVE — AB
Cocaine: NOT DETECTED
Opiates: NOT DETECTED
Tetrahydrocannabinol: POSITIVE — AB

## 2022-03-01 LAB — CBC WITH DIFFERENTIAL/PLATELET
Abs Immature Granulocytes: 0.01 10*3/uL (ref 0.00–0.07)
Abs Immature Granulocytes: 0.01 10*3/uL (ref 0.00–0.07)
Basophils Absolute: 0 10*3/uL (ref 0.0–0.1)
Basophils Absolute: 0 10*3/uL (ref 0.0–0.1)
Basophils Relative: 1 %
Basophils Relative: 1 %
Eosinophils Absolute: 0.1 10*3/uL (ref 0.0–0.5)
Eosinophils Absolute: 0.1 10*3/uL (ref 0.0–0.5)
Eosinophils Relative: 2 %
Eosinophils Relative: 1 %
HCT: 32.1 % — ABNORMAL LOW (ref 39.0–52.0)
HCT: 32.8 % — ABNORMAL LOW (ref 39.0–52.0)
Hemoglobin: 10.4 g/dL — ABNORMAL LOW (ref 13.0–17.0)
Hemoglobin: 10.9 g/dL — ABNORMAL LOW (ref 13.0–17.0)
Immature Granulocytes: 0 %
Immature Granulocytes: 0 %
Lymphocytes Relative: 23 %
Lymphocytes Relative: 23 %
Lymphs Abs: 0.9 10*3/uL (ref 0.7–4.0)
Lymphs Abs: 1 10*3/uL (ref 0.7–4.0)
MCH: 31.6 pg (ref 26.0–34.0)
MCH: 32.2 pg (ref 26.0–34.0)
MCHC: 32.4 g/dL (ref 30.0–36.0)
MCHC: 33.2 g/dL (ref 30.0–36.0)
MCV: 97.6 fL (ref 80.0–100.0)
MCV: 97 fL (ref 80.0–100.0)
Monocytes Absolute: 0.4 10*3/uL (ref 0.1–1.0)
Monocytes Absolute: 0.4 10*3/uL (ref 0.1–1.0)
Monocytes Relative: 10 %
Monocytes Relative: 8 %
Neutro Abs: 2.6 10*3/uL (ref 1.7–7.7)
Neutro Abs: 2.8 10*3/uL (ref 1.7–7.7)
Neutrophils Relative %: 64 %
Neutrophils Relative %: 67 %
Platelets: 231 10*3/uL (ref 150–400)
Platelets: 220 10*3/uL (ref 150–400)
RBC: 3.29 MIL/uL — ABNORMAL LOW (ref 4.22–5.81)
RBC: 3.38 MIL/uL — ABNORMAL LOW (ref 4.22–5.81)
RDW: 15 % (ref 11.5–15.5)
RDW: 15 % (ref 11.5–15.5)
WBC: 4 10*3/uL (ref 4.0–10.5)
WBC: 4.2 10*3/uL (ref 4.0–10.5)
nRBC: 0 % (ref 0.0–0.2)
nRBC: 0 % (ref 0.0–0.2)

## 2022-03-01 LAB — MAGNESIUM
Magnesium: 1.6 mg/dL — ABNORMAL LOW (ref 1.7–2.4)
Magnesium: 1.5 mg/dL — ABNORMAL LOW (ref 1.7–2.4)

## 2022-03-01 LAB — GLUCOSE, CAPILLARY
Glucose-Capillary: 104 mg/dL — ABNORMAL HIGH (ref 70–99)
Glucose-Capillary: 85 mg/dL (ref 70–99)

## 2022-03-01 MED ORDER — LACTATED RINGERS IV SOLN: INTRAVENOUS | Status: DC

## 2022-03-01 MED ORDER — LABETALOL HCL 5 MG/ML IV SOLN: 10.0000 mg | INTRAVENOUS | Status: DC | PRN

## 2022-03-01 MED ORDER — MAGNESIUM 400 MG PO TABS: 400.0000 mg | ORAL_TABLET | Freq: Every day | ORAL | Status: DC

## 2022-03-01 MED ORDER — MAGNESIUM 200 MG PO TABS
400.0000 mg | ORAL_TABLET | Freq: Every day | ORAL | Status: DC
  Filled 2022-03-01: qty 2

## 2022-03-01 MED ORDER — MELATONIN 3 MG PO TABS
3.0000 mg | ORAL_TABLET | Freq: Once | ORAL | Status: DC | PRN
  Filled 2022-03-01: qty 1

## 2022-03-01 MED ORDER — ENOXAPARIN SODIUM 40 MG/0.4ML IJ SOSY
40.0000 mg | PREFILLED_SYRINGE | INTRAMUSCULAR | Status: DC
  Administered 2022-03-01 – 2022-03-02 (×2): 40 mg via SUBCUTANEOUS
  Filled 2022-03-01 (×2): qty 0.4

## 2022-03-01 MED ORDER — METHOCARBAMOL 500 MG PO TABS: 500.0000 mg | ORAL_TABLET | Freq: Three times a day (TID) | ORAL | Status: DC | PRN

## 2022-03-01 MED ORDER — AMOXICILLIN-POT CLAVULANATE 875-125 MG PO TABS
1.0000 | ORAL_TABLET | Freq: Two times a day (BID) | ORAL | Status: DC
  Administered 2022-03-01 – 2022-03-03 (×4): 1 via ORAL
  Filled 2022-03-01 (×4): qty 1

## 2022-03-01 MED ORDER — ASPIRIN EC 81 MG PO TBEC
81.0000 mg | DELAYED_RELEASE_TABLET | Freq: Every day | ORAL | Status: DC
  Administered 2022-03-01 – 2022-03-03 (×3): 81 mg via ORAL
  Filled 2022-03-01 (×3): qty 1

## 2022-03-01 MED ORDER — ACETAMINOPHEN 325 MG PO TABS: 650.0000 mg | ORAL_TABLET | ORAL | Status: DC | PRN

## 2022-03-01 MED ORDER — SACCHAROMYCES BOULARDII 250 MG PO CAPS
250.0000 mg | ORAL_CAPSULE | Freq: Two times a day (BID) | ORAL | Status: DC
  Administered 2022-03-01 – 2022-03-03 (×4): 250 mg via ORAL
  Filled 2022-03-01 (×4): qty 1

## 2022-03-01 MED ORDER — MAGNESIUM SULFATE 4 GM/100ML IV SOLN
4.0000 g | Freq: Once | INTRAVENOUS | Status: AC
  Administered 2022-03-01: 4 g via INTRAVENOUS
  Filled 2022-03-01: qty 100

## 2022-03-01 MED ORDER — HEPARIN SODIUM (PORCINE) 5000 UNIT/ML IJ SOLN
5000.0000 [IU] | Freq: Three times a day (TID) | INTRAMUSCULAR | Status: DC
  Administered 2022-03-01: 5000 [IU] via SUBCUTANEOUS
  Filled 2022-03-01: qty 1

## 2022-03-01 MED ORDER — VITAMIN B-12 100 MCG PO TABS
100.0000 ug | ORAL_TABLET | Freq: Every day | ORAL | Status: DC
  Administered 2022-03-01 – 2022-03-02 (×2): 100 ug via ORAL
  Filled 2022-03-01 (×3): qty 1

## 2022-03-01 MED ORDER — ALBUTEROL SULFATE (2.5 MG/3ML) 0.083% IN NEBU: 2.5000 mg | INHALATION_SOLUTION | RESPIRATORY_TRACT | Status: DC | PRN

## 2022-03-01 MED ORDER — PANTOPRAZOLE SODIUM 40 MG PO TBEC
40.0000 mg | DELAYED_RELEASE_TABLET | Freq: Every day | ORAL | Status: DC
Start: 2022-03-01 — End: 2022-03-03
  Administered 2022-03-01 – 2022-03-03 (×3): 40 mg via ORAL
  Filled 2022-03-01 (×3): qty 1

## 2022-03-01 MED ORDER — GABAPENTIN 300 MG PO CAPS
300.0000 mg | ORAL_CAPSULE | Freq: Three times a day (TID) | ORAL | Status: DC
  Administered 2022-03-01 – 2022-03-03 (×6): 300 mg via ORAL
  Filled 2022-03-01 (×6): qty 1

## 2022-03-01 MED ORDER — FOLIC ACID 1 MG PO TABS
1.0000 mg | ORAL_TABLET | Freq: Every day | ORAL | Status: DC
  Administered 2022-03-01 – 2022-03-02 (×2): 1 mg via ORAL
  Filled 2022-03-01 (×2): qty 1

## 2022-03-01 MED ORDER — INSULIN ASPART 100 UNIT/ML IJ SOLN: 0.0000 [IU] | Freq: Four times a day (QID) | INTRAMUSCULAR | Status: DC

## 2022-03-01 MED ORDER — SULFAMETHOXAZOLE-TRIMETHOPRIM 800-160 MG PO TABS
1.0000 | ORAL_TABLET | Freq: Two times a day (BID) | ORAL | Status: DC
  Administered 2022-03-01 – 2022-03-03 (×4): 1 via ORAL
  Filled 2022-03-01 (×4): qty 1

## 2022-03-01 MED ORDER — THIAMINE HCL 100 MG PO TABS
100.0000 mg | ORAL_TABLET | Freq: Every day | ORAL | Status: DC
  Administered 2022-03-01 – 2022-03-02 (×2): 100 mg via ORAL
  Filled 2022-03-01 (×4): qty 1

## 2022-03-01 MED ORDER — NYSTATIN 100000 UNIT/ML MT SUSP
5.0000 mL | Freq: Four times a day (QID) | OROMUCOSAL | Status: DC
  Administered 2022-03-01 – 2022-03-03 (×9): 500000 [IU] via ORAL
  Filled 2022-03-01 (×12): qty 5

## 2022-03-01 MED ORDER — ONDANSETRON HCL 4 MG/2ML IJ SOLN: 4.0000 mg | Freq: Four times a day (QID) | INTRAMUSCULAR | Status: DC | PRN

## 2022-03-01 MED ORDER — METOPROLOL SUCCINATE ER 25 MG PO TB24
25.0000 mg | ORAL_TABLET | Freq: Every day | ORAL | Status: DC
  Administered 2022-03-01 – 2022-03-02 (×2): 25 mg via ORAL
  Filled 2022-03-01 (×2): qty 1

## 2022-03-01 MED ORDER — ATORVASTATIN CALCIUM 40 MG PO TABS
80.0000 mg | ORAL_TABLET | Freq: Every day | ORAL | Status: DC
  Administered 2022-03-01 – 2022-03-02 (×2): 80 mg via ORAL
  Filled 2022-03-01 (×2): qty 2

## 2022-03-01 NOTE — Progress Notes (Signed)
Pt is off BiPAP and is currently stable. Pt is now 4L [place] Pt states he has no issues breathing at this time. BiPAP at bedside and on standby if needed. RN aware and RT will continue to monitor ?

## 2022-03-01 NOTE — Progress Notes (Signed)
PT Cancellation Note ? ?Patient Details ?Name: [name] ?MRN: [id number] ?DOB: [birth year] ? ? ?Cancelled Treatment:    Reason Eval/Treat Not Completed: Other (comment).  Pt is not ready for PT evaluation per nsg.  Retry at another time. ? ? ?Ramond Dial ?03/01/2022, 10:55 AM ? ?Mee Hives, PT PhD ?Acute Rehab Dept. Number: [facility] [id number] and [place] [phone] ? ?

## 2022-03-01 NOTE — Consult Note (Signed)
WOC Nurse Consult Note: ?Reason for Consult: Chronic nonhealing wound to left plantar foot.  Charcot deformity present. Lymphedema left >right.  Orders for Juxtalite compression at last wound care center note. Patient has not been wearing compression, it appears.  He has been prescribed an offloading shoe as well.  This is not present today.  His history of noncompliance includes missed hyperbaric appointments, missed bloodwork and failure to comply with ordered wound care.  Podiatry consult ordered by wound care center has not been completed that I can see.  Last ordered topical wound therapy was hydrofera blue.This is nonformulary and not available in house. We will instead opt for an absorptive silver hydrofiber.  ?Wound type: Neuropathic ulcer in the setting of charcot deformity.  ?Pressure Injury POA: Yes  pressure and neuropathy present for over 12 years.  ?Measurement: 5 cmx 6 cm x 0.4 cm  ?Wound BMW:UXLK pink, slick nongranulating.  ?Drainage (amount, consistency, odor) moderate serosanguinous  no odor ?Periwound: Edema to left leg in the setting of lymphedema ?Dressing procedure/placement/frequency: Cleanse left foot wound with NS and pat dry. Apply aquacel Ag (LAW[facility] # [id number]) to wound bed.  Cover with gauze and kerlix/tape from below toes to below knee.  Secure with ace wrap and elevate legs.  Change daily.  ?Will not follow at this time.  Please re-consult if needed.  ?Maple Hudson MSN, RN, FNP-BC CWON ?Wound, Ostomy, Continence Nurse ?Pager [phone]  ? ? ?  ?

## 2022-03-01 NOTE — Progress Notes (Signed)
Pt on 2LNC. No resp distress noted. Talked to PT about CPAP and trying it. PT states that he has a sleep study in two weeks and wants to wait until after he gets results from the study. Will continue to monitor.  ?

## 2022-03-01 NOTE — Progress Notes (Addendum)
?PROGRESS NOTE ? ? ? ?[name]  RUE:[id number] DOB: 06/16/1961 DOA: 02/28/2022 ?PCP: Pincus SanesBurns, Stacy J, MD  ? ? ?Chief Complaint  ?Patient presents with  ? Hallucinations  ?  VISUAL  ? ? ?Brief Narrative:  ?Patient is a obese 61 year old gentleman history of chronic left foot wound with osteomyelitis, chronic systolic CHF, OSA, type 2 diabetes, chronic venous insufficiency, hypertension presented to the ED by significant other with altered mental status/acute metabolic encephalopathy.  Patient noted to have been [facility] after spending 48 hours in the hospital for altered mental status. ?Per admitting physician is noted that patient was taken out of facility on day of admission by significant other, noted to have had a bottle of trazodone on his person.  Patient noted to have some difficulty waking up.  Lab work done unremarkable.  Patient received 2 rounds of IV Narcan with no significant improvement.  ABG done with a pH of 7.4, PCO2 of 36, PO2 of 84.  Head CT unremarkable.  Patient placed on the BiPAP due to excessive somnolence on admission.  Patient noted to be alert and following commands appropriately this morning 03/01/2022.  ? ? ?Assessment & Plan: ? Principal Problem: ?  Acute metabolic encephalopathy ?Active Problems: ?  Hypertension ?  Morbid obesity (HCC) ?  OSA (obstructive sleep apnea) ?  Hyperlipidemia ?  Obesity hypoventilation syndrome (HCC) ?  Chronic diastolic CHF (congestive heart failure) (HCC) ?  Chronic foot ulcer (HCC) ?  Type 2 diabetes mellitus with foot ulcer (HCC) ?  Venous stasis dermatitis of both lower extremities ?  Hypomagnesemia ? ? ? ?Assessment and Plan: ?* Acute metabolic encephalopathy ?Patient presented with acute metabolic encephalopathy/somnolence per significant other.  -Status post IV Narcan x2 rounds with no significant improvement on presentation.   ?-No signs or symptoms of infection.   ?-CT head done unremarkable.   ?-Patient  noted to have a bottle of trazodone on his person when significant other took patient out of the SNF.   ?-UDS done positive for benzodiazepines, THC. ?-ABG done on presentation with pH of 7.4/PCO2 of 36/PO2 of 84/bicarb of 22.4. ?-Acetaminophen level < 10, salicylate level , 7. ?-Cardiac enzymes negative. ?-Ammonia level at 29. ?-Concern patient's symptoms likely secondary to trazodone related to excessive sedation. ?-Patient awake, alert, following some commands appropriately. ?-Monitor off BiPAP. ?-Placed on BiPAP nightly. ?-Supportive care. ? ?Venous stasis dermatitis of both lower extremities ?Chronic appearing venous stasis. No indication for abx. ?Wound care consult. ?We will need outpatient follow-up at the wound care center on discharge ? ? ? ? ? ? ? ?Type 2 diabetes mellitus with foot ulcer (HCC) ?- Hemoglobin A1c was 5.0 (11/15/2021). ?-CBG of 77 overnight. ?-SSI every 6 hours. ?-CBG every 6 hours. ?-Follow. ? ?Chronic foot ulcer (HCC) ?Chronic wound on bottom of left foot. See pictures. No surrounding erythema or purulent drainage. No indication for abx. ?-Wound care consult. ?-Outpatient follow-up with wound care center. ? ? ? ? ? ?Chronic diastolic CHF (congestive heart failure) (HCC) ?Stable. Euvolemic. Prn IV lasix. ? ?Obesity hypoventilation syndrome (HCC) ?BiPAP nightly. ?We will need outpatient sleep study. ? ?Hyperlipidemia ?Stable. ? ?OSA (obstructive sleep apnea) ?Stable. ?-BiPAP nightly. ?-We will need outpatient sleep study. ? ?Morbid obesity (HCC) ?Chronic. ?-Lifestyle modification. ?-Outpatient follow-up with PCP. ? ?Hypertension ?-Blood pressure currently stable.   ?-Continue to hold BP meds.   ?-IV labetalol as needed.  ? ?Hypomagnesemia ?- Magnesium at 1.5 today. ?-Magnesium sulfate 4 g IV x1. ?-Repeat  labs in the AM. ? ? ? ? ?  ? ? ?DVT prophylaxis: Heparin ?Code Status: Full ?Family Communication: Updated patient.  No family at bedside. ?Disposition: SNF ? ?Status is: Observation ?The  patient will require care spanning > 2 midnights and should be moved to inpatient because: Severity of illness ?  ?Consultants:  ?None ? ?Procedures:  ?CT head 02/28/2022 ?Chest x-ray 02/28/2022 ? ? ? ?Antimicrobials:  ?None ? ? ? ?Subjective: ?Alert this morning.  Off BiPAP.  Speaking in full sentences.  Denies any chest pain.  No shortness of breath.  No abdominal pain.  Stated prior to coming to the hospital his girlfriend noted he had been having some visual hallucinations.  Patient denies any visual hallucinations today. ? ?Objective: ?Vitals:  ? 03/01/22 1230 03/01/22 1500 03/01/22 1515 03/01/22 1530  ?BP: 115/62 117/77 129/70   ?Pulse: 69 79 79 81  ?Resp: 20 13 (!) 23 16  ?Temp:      ?TempSrc:      ?SpO2: 98% 100% 98% 99%  ? ?No intake or output data in the 24 hours ending 03/01/22 1722 ?There were no vitals filed for this visit. ? ?Examination: ? ?General exam: Appears calm and comfortable.  Whitish exudate noted on tongue. ?Respiratory system: Clear to auscultation anterior lung fields.  No wheezes, no crackles, no rhonchi.Marland Kitchen Respiratory effort normal. ?Cardiovascular system: S1 & S2 heard, RRR. No JVD, murmurs, rubs, gallops or clicks. No pedal edema. ?Gastrointestinal system: Abdomen is nondistended, soft and nontender. No organomegaly or masses felt. Normal bowel sounds heard. ?Central nervous system: Alert and oriented. No focal neurological deficits. ?Extremities: Symmetric 5 x 5 power. ?Skin: No rashes, lesions or ulcers ?Psychiatry: Judgement and insight appear fair.  Mood & affect appropriate.  ? ? ? ?Data Reviewed:  ? ?CBC: ?Recent Labs  ?Lab 02/28/22 ?1807 02/28/22 ?1832 02/28/22 ?1842 03/01/22 ?1018 03/01/22 ?1620  ?WBC 4.4  --   --  4.0 4.2  ?NEUTROABS 2.9  --   --  2.6 2.8  ?HGB 10.7* 9.9* 10.5* 10.4* 10.9*  ?HCT 32.3* 29.0* 31.0* 32.1* 32.8*  ?MCV 98.5  --   --  97.6 97.0  ?PLT 232  --   --  231 220  ? ? ?Basic Metabolic Panel: ?Recent Labs  ?Lab 02/28/22 ?1807 02/28/22 ?1832 02/28/22 ?1842  03/01/22 ?1018 03/01/22 ?1620  ?NA 138 141 139 137 136  ?K 4.2 3.7 4.0 4.1 3.8  ?CL 109  --   --  107 106  ?CO2 21*  --   --  23 21*  ?GLUCOSE 83  --   --  85 100*  ?BUN 11  --   --  5* <5*  ?CREATININE 1.05  --   --  0.76 0.78  ?CALCIUM 9.5  --   --  8.9 8.9  ?MG 1.9  --   --  1.6* 1.5*  ? ? ?GFR: ?Estimated Creatinine Clearance: 137.1 mL/min (by C-G formula based on SCr of 0.78 mg/dL). ? ?Liver Function Tests: ?Recent Labs  ?Lab 02/28/22 ?1807 03/01/22 ?1018 03/01/22 ?1620  ?AST 46* 37 34  ?ALT 24 22 24   ?ALKPHOS 219* 215* 185*  ?BILITOT 1.3* 1.4* 1.6*  ?PROT 7.5 6.8 7.2  ?ALBUMIN 3.2* 2.9* 2.9*  ? ? ?CBG: ?Recent Labs  ?Lab 02/28/22 ?1922 03/01/22 ?1552 03/01/22 ?1621  ?GLUCAP 77 64* 95  ? ? ? ?Recent Results (from the past 240 hour(s))  ?MRSA Next Gen by PCR, Nasal     Status: None  ? Collection  Time: 02/21/22 12:00 AM  ? Specimen: Nasal Mucosa; Nasal Swab  ?Result Value Ref Range Status  ? MRSA by PCR Next Gen NOT DETECTED NOT DETECTED Final  ?  Comment: (NOTE) ?The GeneXpert MRSA Assay (FDA approved for NASAL specimens only), ?is one component of a comprehensive MRSA colonization surveillance ?program. It is not intended to diagnose MRSA infection nor to guide ?or monitor treatment for MRSA infections. ?Test performance is not FDA approved in patients less than 2 years ?old. ?Performed at [facility], [address], [place], [place] ?[address] ?  ?  ? ? ? ? ? ?Radiology Studies: ?CT HEAD WO CONTRAST ? ?Result Date: 02/28/2022 ?CLINICAL DATA:  Altered mental status and hallucinations, initial encounter EXAM: CT HEAD WITHOUT CONTRAST TECHNIQUE: Contiguous axial images were obtained from the base of the skull through the vertex without intravenous contrast. RADIATION DOSE REDUCTION: This exam was performed according to the departmental dose-optimization program which includes automated exposure control, adjustment of the mA and/or kV according to patient size and/or use of iterative reconstruction  technique. COMPARISON:  02/20/2022 FINDINGS: Brain: No evidence of acute infarction, hemorrhage, hydrocephalus, extra-axial collection or mass lesion/mass effect. Stable mild atrophic changes are noted. Vascular: No hyp

## 2022-03-01 NOTE — ED Notes (Signed)
Pt continues to be responsive only to pain. ?

## 2022-03-01 NOTE — Hospital Course (Signed)
Patient is a obese 61 year old gentleman history of chronic left foot wound with osteomyelitis, chronic systolic CHF, OSA, type 2 diabetes, chronic venous insufficiency, hypertension presented to the ED by significant other with altered mental status/acute metabolic encephalopathy.  Patient noted to have been recently discharged to [facility] on 02/21/2022 after spending 48 hours in the hospital for altered mental status. ?Per admitting physician is noted that patient was taken out of facility on day of admission by significant other, noted to have had a bottle of trazodone on his person.  Patient noted to have some difficulty waking up.  Lab work done unremarkable.  Patient received 2 rounds of IV Narcan with no significant improvement.  ABG done with a pH of 7.4, PCO2 of 36, PO2 of 84.  Head CT unremarkable.  Patient placed on the BiPAP due to excessive somnolence on admission.  Patient noted to be alert and following commands appropriately this morning 03/01/2022. ?

## 2022-03-01 NOTE — Assessment & Plan Note (Addendum)
-   Magnesium at 1.5 today. ?-Status post IV magnesium 4 g x 1 magnesium currently at 2.0. ?-Repeat labs in the AM. ?

## 2022-03-01 NOTE — Evaluation (Signed)
Physical Therapy Evaluation ?Patient Details ?Name: [name] ?MRN: [id number] ?DOB: [birth year] ?Today's Date: 03/01/2022 ? ?History of Present Illness ? 61 y.o. male admitted 5/14 for AMS from [facility], by his sign other.  Pt was given Narcan as he was found with Trazodone on his person, with no improvement in cognition.  Was placed on bipap due to somnolence, but now referred to rehab with recovery of alertness.  PMHx:  Sepsis  L diabetic foot infection/ulcer, possible early osteomyelitis, septic joint arthritis with infected tenosynovitis, HTN, obesity, HLD, prediabetes, alcoholic cirrhosis, alcohol abuse.  ?Clinical Impression ? Pt was seen for observation and assistance to move from gurney to his hosp bed, and note he is unable to maintain NWB on L foot for pivot to bed.  Pt is not concerned with his mobility and talks freely of going directly home despite his lack of help.  His significiant other is traveling for work, and is gone for 2 days at a time.  Even so, pt is confident he can manage.  Will recommend SNF stay to work on recovery of transfers without WB on L foot, and has new order for [facility] to protect the L foot at all times. This recommendation is made due to his lack of help at home and inability to get from one surface to another alone without wb on L foot.  Follow along with him to increase tolerance and strength to stand on RLE, to scoot up the bed and to a chair, and to develop safety awareness for protecting L foot from further trauma to the wound.  See for acute PT goals as are outlined below. ?   ? ?Recommendations for follow up therapy are one component of a multi-disciplinary discharge planning process, led by the attending physician.  Recommendations may be updated based on patient status, additional functional criteria and insurance authorization. ? ?Follow Up Recommendations Skilled nursing-short term rehab (<3 hours/day) ? ?  ?Assistance Recommended at Discharge Frequent or  constant Supervision/Assistance  ?Patient can return home with the following ? Two people to help with walking and/or transfers;A lot of help with bathing/dressing/bathroom;Assistance with cooking/housework;Direct supervision/assist for medications management;Direct supervision/assist for financial management;Assist for transportation;Help with stairs or ramp for entrance ? ?  ?Equipment Recommendations None recommended by PT  ?Recommendations for Other Services ?    ?  ?Functional Status Assessment Patient has had a recent decline in their functional status and demonstrates the ability to make significant improvements in function in a reasonable and predictable amount of time.  ? ?  ?Precautions / Restrictions Precautions ?Precautions: Fall ?Restrictions ?Weight Bearing Restrictions: Yes ?LLE Weight Bearing: Non weight bearing ?Other Position/Activity Restrictions: New order for [facility] after PT contacted wound care nurse  ? ?  ? ?Mobility ? Bed Mobility ?Overal bed mobility: Needs Assistance ?Bed Mobility: Supine to Sit, Sit to Supine ?  ?  ?Supine to sit: Min assist ?Sit to supine: Min assist ?  ?General bed mobility comments: pt feels he can move alone but is not safe, tends to impulsively move without regard for safety and instructions ?  ? ?Transfers ?Overall transfer level: Needs assistance ?Equipment used: 2 person hand held assist ?Transfers: Bed to chair/wheelchair/BSC, Sit to/from Stand ?Sit to Stand: Mod assist, +2 physical assistance, +2 safety/equipment ?  ?  ?Squat pivot transfers: Mod assist, +2 physical assistance, +2 safety/equipment, From elevated surface ?  ?  ?General transfer comment: pt is unable to maintain NWB on LLE during a pivot or standing,  has been apparently at home doing wb on LLE without limitations ?  ? ?Ambulation/Gait ?  ?  ?  ?  ?  ?  ?  ?General Gait Details: unable to attempt ? ?Stairs ?  ?  ?  ?  ?  ? ?Wheelchair Mobility ?  ? ?Modified Rankin (Stroke Patients Only) ?  ? ?   ? ?Balance Overall balance assessment: Needs assistance ?Sitting-balance support: Single extremity supported, Feet supported ?Sitting balance-Leahy Scale: Fair ?  ?  ?Standing balance support: Bilateral upper extremity supported, During functional activity, Reliant on assistive device for balance ?Standing balance-Leahy Scale: Poor ?Standing balance comment: pt tends to want to lean forward and just pivot on his feet to get to another surface, with WB on LLE ?  ?  ?  ?  ?  ?  ?  ?  ?  ?  ?  ?   ? ? ? ?Pertinent Vitals/Pain Pain Assessment ?Pain Assessment: Faces ?Faces Pain Scale: Hurts a little bit ?Pain Location: L foot ?Pain Descriptors / Indicators: Guarding ?Pain Intervention(s): Monitored during session, Repositioned  ? ? ?Home Living Family/patient expects to be discharged to:: Skilled nursing facility ?Living Arrangements: Other (Comment) (Short rehab stay) ?  ?  ?  ?  ?  ?  ?  ?  ?Additional Comments: at home with girlfriend who travels for work  ?  ?Prior Function Prior Level of Function : Needs assist ? Cognitive Assist : Mobility (cognitive) ?Mobility (Cognitive): Set up cues ?  ?Physical Assist : Mobility (physical) ?Mobility (physical): Transfers ?  ?Mobility Comments: has been walking on LLE WBAT at home but should be NWB ?ADLs Comments: takes sink bath if his girlfriend is traveling ?  ? ? ?Hand Dominance  ? Dominant Hand: Right ? ?  ?Extremity/Trunk Assessment  ? Upper Extremity Assessment ?Upper Extremity Assessment: Generalized weakness ?  ? ?Lower Extremity Assessment ?Lower Extremity Assessment: Generalized weakness;LLE deficits/detail ?LLE Deficits / Details: significant wound with Charcot foot ?LLE Sensation: history of peripheral neuropathy ?LLE Coordination: decreased gross motor ?  ? ?Cervical / Trunk Assessment ?Cervical / Trunk Assessment: Kyphotic (mild)  ?Communication  ? Communication: No difficulties  ?Cognition Arousal/Alertness: Awake/alert ?Behavior During Therapy:  Impulsive ?Overall Cognitive Status: Impaired/Different from baseline ?Area of Impairment: Problem solving, Awareness, Safety/judgement, Following commands, Memory, Attention ?  ?  ?  ?  ?  ?  ?  ?  ?  ?Current Attention Level: Selective ?Memory: Decreased recall of precautions ?Following Commands: Follows one step commands with increased time, Follows one step commands inconsistently ?Safety/Judgement: Decreased awareness of safety, Decreased awareness of deficits ?Awareness: Intellectual ?Problem Solving: Slow processing, Requires verbal cues, Requires tactile cues ?General Comments: pt is ordered to be NWB, has no observation or recall of this instruction ?  ?  ? ?  ?General Comments General comments (skin integrity, edema, etc.): Pt has a very chronic wound on L foot plantar surface, has no protective footwear withhim and has been WB on that foot at home.  Wound care was consulted to ask for guidelines and reports no WB on L foot and agreed to order [facility] for protection of L foot ? ?  ?Exercises    ? ?Assessment/Plan  ?  ?PT Assessment Patient needs continued PT services  ?PT Problem List Decreased strength;Decreased range of motion;Decreased activity tolerance;Decreased balance;Decreased mobility;Decreased coordination;Decreased cognition;Decreased knowledge of use of DME;Decreased safety awareness;Cardiopulmonary status limiting activity;Decreased skin integrity;Pain ? ?   ?  ?PT Treatment Interventions DME instruction;Functional mobility  training;Therapeutic activities;Therapeutic exercise;Balance training;Neuromuscular re-education;Patient/family education   ? ?PT Goals (Current goals can be found in the Care Plan section)  ?Acute Rehab PT Goals ?Patient Stated Goal: to go directly home and stay alone ?PT Goal Formulation: With patient ?Time For Goal Achievement: 03/15/22 ?Potential to Achieve Goals: Fair ? ?  ?Frequency Min 3X/week ?  ? ? ?Co-evaluation   ?  ?  ?  ?  ? ? ?  ?AM-PAC PT "6 Clicks" Mobility   ?Outcome Measure Help needed turning from your back to your side while in a flat bed without using bedrails?: A Little ?Help needed moving from lying on your back to sitting on the side of a flat bed without using bedrail

## 2022-03-01 NOTE — ED Notes (Signed)
Dr. Janee Morn at bedside-- pt is off BiPap --  ?Pt is oriented x 4- a little slow at answering some questions, but does answer correctly.  ?States that he had " allusions" at "[facility]" and had to come to the hospital.  ?

## 2022-03-01 NOTE — ED Notes (Signed)
CBG 64 - pt asymptomatic with no complaints. Pt's food tray set up and given orange juice. Will re-check sugar after pt eats.  ?

## 2022-03-02 ENCOUNTER — Encounter: Payer: Self-pay | Admitting: Internal Medicine

## 2022-03-02 DIAGNOSIS — E782 Mixed hyperlipidemia: Secondary | ICD-10-CM | POA: Diagnosis not present

## 2022-03-02 DIAGNOSIS — I5032 Chronic diastolic (congestive) heart failure: Secondary | ICD-10-CM | POA: Diagnosis not present

## 2022-03-02 DIAGNOSIS — G9341 Metabolic encephalopathy: Secondary | ICD-10-CM | POA: Diagnosis not present

## 2022-03-02 DIAGNOSIS — L97522 Non-pressure chronic ulcer of other part of left foot with fat layer exposed: Secondary | ICD-10-CM | POA: Diagnosis not present

## 2022-03-02 LAB — CBC WITH DIFFERENTIAL/PLATELET
Abs Immature Granulocytes: 0.01 10*3/uL (ref 0.00–0.07)
Basophils Absolute: 0 10*3/uL (ref 0.0–0.1)
Basophils Relative: 1 %
Eosinophils Absolute: 0.1 10*3/uL (ref 0.0–0.5)
Eosinophils Relative: 2 %
HCT: 29.7 % — ABNORMAL LOW (ref 39.0–52.0)
Hemoglobin: 9.9 g/dL — ABNORMAL LOW (ref 13.0–17.0)
Immature Granulocytes: 0 %
Lymphocytes Relative: 24 %
Lymphs Abs: 1 10*3/uL (ref 0.7–4.0)
MCH: 31.7 pg (ref 26.0–34.0)
MCHC: 33.3 g/dL (ref 30.0–36.0)
MCV: 95.2 fL (ref 80.0–100.0)
Monocytes Absolute: 0.4 10*3/uL (ref 0.1–1.0)
Monocytes Relative: 10 %
Neutro Abs: 2.6 10*3/uL (ref 1.7–7.7)
Neutrophils Relative %: 63 %
Platelets: 211 10*3/uL (ref 150–400)
RBC: 3.12 MIL/uL — ABNORMAL LOW (ref 4.22–5.81)
RDW: 14.9 % (ref 11.5–15.5)
WBC: 4.1 10*3/uL (ref 4.0–10.5)
nRBC: 0 % (ref 0.0–0.2)

## 2022-03-02 LAB — BASIC METABOLIC PANEL
Anion gap: 9 (ref 5–15)
BUN: 5 mg/dL — ABNORMAL LOW (ref 6–20)
CO2: 23 mmol/L (ref 22–32)
Calcium: 8.5 mg/dL — ABNORMAL LOW (ref 8.9–10.3)
Chloride: 103 mmol/L (ref 98–111)
Creatinine, Ser: 0.74 mg/dL (ref 0.61–1.24)
GFR, Estimated: >60 mL/min (ref >60)
Glucose, Bld: 98 mg/dL (ref 70–99)
Potassium: 3.4 mmol/L — ABNORMAL LOW (ref 3.5–5.1)
Sodium: 135 mmol/L (ref 135–145)

## 2022-03-02 LAB — GLUCOSE, CAPILLARY
Glucose-Capillary: 114 mg/dL — ABNORMAL HIGH (ref 70–99)
Glucose-Capillary: 81 mg/dL (ref 70–99)
Glucose-Capillary: 113 mg/dL — ABNORMAL HIGH (ref 70–99)
Glucose-Capillary: 117 mg/dL — ABNORMAL HIGH (ref 70–99)

## 2022-03-02 LAB — HEMOGLOBIN A1C
Hgb A1c MFr Bld: 5 % (ref 4.8–5.6)
Mean Plasma Glucose: 96.8 mg/dL

## 2022-03-02 LAB — MAGNESIUM: Magnesium: 2 mg/dL (ref 1.7–2.4)

## 2022-03-02 MED ORDER — FUROSEMIDE 40 MG PO TABS
40.0000 mg | ORAL_TABLET | Freq: Every day | ORAL | Status: DC
  Administered 2022-03-02 – 2022-03-03 (×2): 40 mg via ORAL
  Filled 2022-03-02 (×2): qty 1

## 2022-03-02 MED ORDER — INSULIN ASPART 100 UNIT/ML IJ SOLN: 0.0000 [IU] | Freq: Three times a day (TID) | INTRAMUSCULAR | Status: DC

## 2022-03-02 MED ORDER — MAGNESIUM OXIDE -MG SUPPLEMENT 400 (240 MG) MG PO TABS
400.0000 mg | ORAL_TABLET | Freq: Every day | ORAL | Status: DC
  Administered 2022-03-02 – 2022-03-03 (×2): 400 mg via ORAL
  Filled 2022-03-02 (×2): qty 1

## 2022-03-02 MED ORDER — POTASSIUM CHLORIDE CRYS ER 20 MEQ PO TBCR
20.0000 meq | EXTENDED_RELEASE_TABLET | Freq: Two times a day (BID) | ORAL | Status: DC
  Administered 2022-03-02 – 2022-03-03 (×3): 20 meq via ORAL
  Filled 2022-03-02 (×3): qty 1

## 2022-03-02 NOTE — Progress Notes (Signed)
Patient refusing CPAP at this time

## 2022-03-02 NOTE — Evaluation (Signed)
Occupational Therapy Evaluation ?Patient Details ?Name: [name] ?MRN: [id number] ?DOB: [birth year] ?Today's Date: 03/02/2022 ? ? ?History of Present Illness 61 y.o. male admitted 5/14 for AMS from [facility], by his sign other.  Pt was given Narcan as he was found with Trazodone on his person, with no improvement in cognition.  Was placed on bipap due to somnolence, but now referred to rehab with recovery of alertness.  PMHx:  Sepsis  L diabetic foot infection/ulcer, possible early osteomyelitis, septic joint arthritis with infected tenosynovitis, HTN, obesity, HLD, prediabetes, alcoholic cirrhosis, alcohol abuse.  ? ?Clinical Impression ?  ?Pt reports at baseline pt lives with girlfriend, who provides some assist for mobility and IADLs. Pt reports he is aware of his precautions, however reports ambulating short distances (I.e. bathroom) at home. Pt currently  mod I - Max A for ADLs, min A for bed mobility, initially mod A+2 for sit to stand transfer, however pt unable to adhere to precautions in standing, opted for lateral scoot transfer to chair. Pt min A for lateral scoot. Pt presenting with impairments listed below, will follow acutely. Recommend SNF at d/c. ?   ? ?Recommendations for follow up therapy are one component of a multi-disciplinary discharge planning process, led by the attending physician.  Recommendations may be updated based on patient status, additional functional criteria and insurance authorization.  ? ?Follow Up Recommendations ? Skilled nursing-short term rehab (<3 hours/day)  ?  ?Assistance Recommended at Discharge Intermittent Supervision/Assistance  ?Patient can return home with the following A lot of help with walking and/or transfers;A lot of help with bathing/dressing/bathroom;Assistance with cooking/housework;Direct supervision/assist for financial management;Direct supervision/assist for medications management;Assist for transportation;Help with stairs or ramp for  entrance ? ?  ?Functional Status Assessment ? Patient has had a recent decline in their functional status and demonstrates the ability to make significant improvements in function in a reasonable and predictable amount of time.  ?Equipment Recommendations ? None recommended by OT;Other (comment) (defer to next venue of care)  ?  ?Recommendations for Other Services   ? ? ?  ?Precautions / Restrictions Precautions ?Precautions: Fall ?Restrictions ?Weight Bearing Restrictions: Yes ?LLE Weight Bearing: Touchdown weight bearing ?Other Position/Activity Restrictions: TWB per wound care RN on 5/16; offloading shoe with insole  ? ?  ? ?Mobility Bed Mobility ?Overal bed mobility: Needs Assistance ?Bed Mobility: Supine to Sit, Sit to Supine ?  ?  ?Supine to sit: Min assist ?  ?  ?General bed mobility comments: increased time, easily distracted ?  ? ?Transfers ?Overall transfer level: Needs assistance ?Equipment used: 2 person hand held assist, Rolling walker (2 wheels) ?Transfers: Bed to chair/wheelchair/BSC, Sit to/from Stand ?Sit to Stand: Mod assist, +2 physical assistance ?  ?  ?  ?  ? Lateral/Scoot Transfers: Min assist ?General transfer comment: cannot adhere to WB precautions in standing, opted for lateral scoot transfer, min A ?  ? ?  ?Balance Overall balance assessment: Needs assistance ?Sitting-balance support: Single extremity supported, Feet supported ?Sitting balance-Leahy Scale: Fair ?  ?  ?Standing balance support: Bilateral upper extremity supported, During functional activity, Reliant on assistive device for balance ?Standing balance-Leahy Scale: Poor ?  ?  ?  ?  ?  ?  ?  ?  ?  ?  ?  ?  ?   ? ?ADL either performed or assessed with clinical judgement  ? ?ADL Overall ADL's : Needs assistance/impaired ?Eating/Feeding: Modified independent;Sitting ?  ?Grooming: Modified independent;Sitting ?  ?Upper Body Bathing: Minimal assistance;Sitting ?  ?  Lower Body Bathing: Maximal assistance;Sitting/lateral leans;Sit  to/from stand ?  ?Upper Body Dressing : Minimal assistance;Sitting ?Upper Body Dressing Details (indicate cue type and reason): to don gown on backside ?Lower Body Dressing: Sitting/lateral leans;Sit to/from stand;Moderate assistance ?Lower Body Dressing Details (indicate cue type and reason): to don shoe ?Toilet Transfer: Moderate assistance;Stand-pivot;Rolling walker (2 wheels);BSC/3in1 ?  ?Toileting- Clothing Manipulation and Hygiene: Minimal assistance;Sitting/lateral lean;Sit to/from stand ?  ?  ?  ?Functional mobility during ADLs: Moderate assistance;Rolling walker (2 wheels) ?   ? ? ? ?Vision Baseline Vision/History: 1 Wears glasses ?Ability to See in Adequate Light: 0 Adequate ?Patient Visual Report: No change from baseline ?Vision Assessment?: No apparent visual deficits ?Additional Comments: pt reports wearing glasses at baseline, however does not have them here in hospital  ?   ?Perception   ?  ?Praxis   ?  ? ?Pertinent Vitals/Pain Pain Assessment ?Pain Assessment: Faces ?Pain Score: 2  ?Faces Pain Scale: Hurts a little bit ?Pain Location: L foot ?Pain Descriptors / Indicators: Guarding ?Pain Intervention(s): Limited activity within patient's tolerance, Monitored during session, Repositioned  ? ? ? ?Hand Dominance Right ?  ?Extremity/Trunk Assessment Upper Extremity Assessment ?Upper Extremity Assessment: Generalized weakness ?  ?Lower Extremity Assessment ?Lower Extremity Assessment: Defer to PT evaluation ?  ?  ?  ?Communication Communication ?Communication: No difficulties ?  ?Cognition Arousal/Alertness: Awake/alert ?  ?Overall Cognitive Status: Impaired/Different from baseline ?Area of Impairment: Problem solving, Awareness, Safety/judgement, Following commands, Memory, Attention ?  ?  ?  ?  ?  ?  ?  ?  ?  ?Current Attention Level: Selective ?  ?Following Commands: Follows one step commands with increased time, Follows one step commands inconsistently ?Safety/Judgement: Decreased awareness of safety,  Decreased awareness of deficits ?Awareness: Intellectual ?Problem Solving: Slow processing, Requires verbal cues, Requires tactile cues ?General Comments: pt able to recall LLE precautions, states he has had these for a few months now ?  ?  ?General Comments  VSS on RA ? ?  ?Exercises   ?  ?Shoulder Instructions    ? ? ?Home Living Family/patient expects to be discharged to:: Private residence ?  ?Available Help at Discharge: Family;Available PRN/intermittently ?Type of Home: House ?Home Access: Stairs to enter ?Entrance Stairs-Number of Steps: 2 ?Entrance Stairs-Rails: Right;Left ?Home Layout: Two level;Able to live on main level with bedroom/bathroom ?  ?  ?Bathroom Shower/Tub: Walk-in shower ?  ?Bathroom Toilet: Standard ?Bathroom Accessibility: Yes ?  ?Home Equipment: Agricultural consultantolling Walker (2 wheels);Rollator (4 wheels);BSC/3in1;Shower seat;Wheelchair - manual;Toilet riser;Crutches ?  ?Additional Comments: at home with girlfriend who travels for work ?  ? ?  ?Prior Functioning/Environment Prior Level of Function : Needs assist ? Cognitive Assist : Mobility (cognitive) ?Mobility (Cognitive): Set up cues ?  ?Physical Assist : Mobility (physical) ?Mobility (physical): Transfers ?  ?Mobility Comments: uses RW and w/c, reports has been WBAT LLE ?ADLs Comments: does do some IADLs, bathes sinkside if girlfriend is out of town ?  ? ?  ?  ?OT Problem List: Decreased strength;Decreased range of motion;Decreased activity tolerance;Impaired balance (sitting and/or standing);Decreased safety awareness;Decreased knowledge of use of DME or AE ?  ?   ?OT Treatment/Interventions: Self-care/ADL training;Therapeutic exercise;DME and/or AE instruction;Therapeutic activities;Patient/family education;Balance training;Cognitive remediation/compensation  ?  ?OT Goals(Current goals can be found in the care plan section) Acute Rehab OT Goals ?Patient Stated Goal: none stated ?OT Goal Formulation: With patient ?Time For Goal Achievement:  03/16/22 ?Potential to Achieve Goals: Good ?ADL Goals ?Pt Will Perform Upper Body Dressing:  with modified independence;sitting ?Pt Will Perform Lower Body Dressing: with modified independence;sitting/lateral leans ?Pt W

## 2022-03-02 NOTE — Progress Notes (Signed)
?PROGRESS NOTE ? ? ? ?[name]  ZOX:[id number] DOB: 08/28/1961 DOA: 02/28/2022 ?PCP: Pincus SanesBurns, Stacy J, MD  ? ? ?Chief Complaint  ?Patient presents with  ? Hallucinations  ?  VISUAL  ? ? ?Brief Narrative:  ?Patient is a obese 61 year old gentleman history of chronic left foot wound with osteomyelitis, chronic systolic CHF, OSA, type 2 diabetes, chronic venous insufficiency, hypertension presented to the ED by significant other with altered mental status/acute metabolic encephalopathy.  Patient noted to have been recently discharged to [facility] on 02/21/2022 after spending 48 hours in the hospital for altered mental status. ?Per admitting physician is noted that patient was taken out of facility on day of admission by significant other, noted to have had a bottle of trazodone on his person.  Patient noted to have some difficulty waking up.  Lab work done unremarkable.  Patient received 2 rounds of IV Narcan with no significant improvement.  ABG done with a pH of 7.4, PCO2 of 36, PO2 of 84.  Head CT unremarkable.  Patient placed on the BiPAP due to excessive somnolence on admission.  Patient noted to be alert and following commands appropriately this morning 03/01/2022.  ? ? ?Assessment & Plan: ? Principal Problem: ?  Acute metabolic encephalopathy ?Active Problems: ?  Hypertension ?  Morbid obesity (HCC) ?  OSA (obstructive sleep apnea) ?  Hyperlipidemia ?  Obesity hypoventilation syndrome (HCC) ?  Chronic diastolic CHF (congestive heart failure) (HCC) ?  Chronic foot ulcer (HCC) ?  Type 2 diabetes mellitus with foot ulcer (HCC) ?  Venous stasis dermatitis of both lower extremities ?  Hypokalemia ?  Hypomagnesemia ? ? ? ?Assessment and Plan: ?* Acute metabolic encephalopathy ?Patient presented with acute metabolic encephalopathy/somnolence per significant other.  -Status post IV Narcan x2 rounds with no significant improvement on presentation.   ?-No signs or symptoms of infection.   ?-CT head done unremarkable.    ?-Patient noted to have a bottle of trazodone on his person when significant other took patient out of the SNF.   ?-UDS done positive for benzodiazepines, THC. ?-ABG done on presentation with pH of 7.4/PCO2 of 36/PO2 of 84/bicarb of 22.4. ?-Acetaminophen level < 10, salicylate level , 7. ?-Cardiac enzymes negative. ?-Ammonia level at 29. ?-Concern patient's symptoms likely secondary to trazodone related to excessive sedation. ?-Patient awake, alert, following some commands appropriately. ?-Monitor off BiPAP. ?-O2 titrated currently on room air. ?-BiPAP changed to nightly however noted that patient was refusing. ?-Discontinue BiPAP. ?-Supportive care. ?-Placed on BiPAP nightly. ?-Supportive care. ? ?Venous stasis dermatitis of both lower extremities ?Chronic appearing venous stasis. No indication for abx. ?Wound care consult. ?We will need outpatient follow-up at the wound care center on discharge. ?-Patient noted to have been on Augmentin and Bactrim prophylactically prior to admission which have been resumed. ? ? ? ? ? ? ? ?Type 2 diabetes mellitus with foot ulcer (HCC) ?- Hemoglobin A1c was 5.0 (11/15/2021). ?-CBG 114 this morning.   ?-Change sliding scale insulin to 3 times daily with meals.   ?-Change CBGs to before meals and at bedtime.   ?-If blood glucose remains well controlled over the next 24 to 48 hours could likely discontinue sliding scale insulin.   ?-Supportive care.  ? ?Chronic foot ulcer (HCC) ?Chronic wound on bottom of left foot. See pictures. No surrounding erythema or purulent drainage. No indication for abx. ?-Wound care consult. ?-Outpatient follow-up with wound care center. ? ? ? ? ? ?Chronic diastolic CHF (congestive heart failure) (HCC) ?Stable.  Euvolemic. ?-Resume home regimen Lipitor, Lasix, Toprol-XL. ?-Outpatient follow-up. ? ?Obesity hypoventilation syndrome (HCC) ?BiPAP nightly, however patient noted to be refusing. ?Will need outpatient sleep  study. ? ?Hyperlipidemia ?Stable. ?-Resume home regimen Lipitor ? ?OSA (obstructive sleep apnea) ?Stable. ?-BiPAP nightly ordered however noted patient refused. ?-Discontinue BiPAP. ?-We will need outpatient sleep study. ? ?Morbid obesity (HCC) ?Chronic. ?-Lifestyle modification. ?-Outpatient follow-up with PCP. ? ?Hypertension ?-Blood pressure currently stable.   ?-Patient's antihypertensive medications initially held on admission.   ?-Home regimen Lasix and Toprol-XL resumed.  ?-IV labetalol as needed.  ? ?Hypomagnesemia ?- Magnesium at 1.5 today. ?-Status post IV magnesium 4 g x 1 magnesium currently at 2.0. ?-Repeat labs in the AM. ? ?Hypokalemia ?- Place back on home regimen potassium. ? ? ? ? ?  ? ? ?DVT prophylaxis: Heparin>>> Lovenox ?Code Status: Full ?Family Communication: Updated patient.  No family at bedside. ?Disposition: SNF ? ?Status is: Observation ?The patient will require care spanning > 2 midnights and should be moved to inpatient because: Severity of illness ?  ?Consultants:  ?None ? ?Procedures:  ?CT head 02/28/2022 ?Chest x-ray 02/28/2022 ? ? ? ?Antimicrobials:  ?None ? ? ? ?Subjective: ?Patient alert.  On room air with sats of 100%.  Off BiPAP.  Refused BiPAP overnight.  No chest pain.  No shortness of breath.  No abdominal pain.  Denies any further visual hallucinations.  Patient states does not want to go back to skilled nursing facility he came from. ? ?Objective: ?Vitals:  ? 03/02/22 0400 03/02/22 0800 03/02/22 1314 03/02/22 1522  ?BP: 123/77 (!) 109/49 (!) 117/59 124/81  ?Pulse: 71 72 71 85  ?Resp: 17 18 20 20   ?Temp: 98.1 ?F (36.7 ?C) 98.4 ?F (36.9 ?C) 98.6 ?F (37 ?C) 98.1 ?F (36.7 ?C)  ?TempSrc: Oral Oral Oral Oral  ?SpO2: 100% 99% 98% 100%  ? ? ?Intake/Output Summary (Last 24 hours) at 03/02/2022 1626 ?Last data filed at 03/02/2022 0630 ?Gross per 24 hour  ?Intake 240 ml  ?Output 1250 ml  ?Net -1010 ml  ? ?There were no vitals filed for this visit. ? ?Examination: ? ?General exam:  NAD. ?Respiratory system: CTA B.  No wheezes, no crackles, no rhonchi.  Fair air movement.  Speaking in full sentences.  ?Cardiovascular system: Regular rate rhythm no murmurs rubs or gallops.  No JVD.  No lower extremity edema ?Gastrointestinal system: Abdomen is soft, nontender, nondistended, positive bowel sounds.  No rebound.  No guarding. ?Central nervous system: Alert and oriented. No focal neurological deficits. ?Extremities: Symmetric 5 x 5 power. ?Skin: No rashes, lesions or ulcers ?Psychiatry: Judgement and insight appear fair.  Mood & affect appropriate.  ? ? ? ?Data Reviewed:  ? ?CBC: ?Recent Labs  ?Lab 02/28/22 ?1807 02/28/22 ?1832 02/28/22 ?1842 03/01/22 ?1018 03/01/22 ?1620 03/02/22 ?0150  ?WBC 4.4  --   --  4.0 4.2 4.1  ?NEUTROABS 2.9  --   --  2.6 2.8 2.6  ?HGB 10.7* 9.9* 10.5* 10.4* 10.9* 9.9*  ?HCT 32.3* 29.0* 31.0* 32.1* 32.8* 29.7*  ?MCV 98.5  --   --  97.6 97.0 95.2  ?PLT 232  --   --  231 220 211  ? ? ?Basic Metabolic Panel: ?Recent Labs  ?Lab 02/28/22 ?1807 02/28/22 ?1832 02/28/22 ?1842 03/01/22 ?1018 03/01/22 ?1620 03/02/22 ?0150  ?NA 138 141 139 137 136 135  ?K 4.2 3.7 4.0 4.1 3.8 3.4*  ?CL 109  --   --  107 106 103  ?CO2 21*  --   --  23 21* 23  ?GLUCOSE 83  --   --  85 100* 98  ?BUN 11  --   --  5* <5* 5*  ?CREATININE 1.05  --   --  0.76 0.78 0.74  ?CALCIUM 9.5  --   --  8.9 8.9 8.5*  ?MG 1.9  --   --  1.6* 1.5* 2.0  ? ? ?GFR: ?Estimated Creatinine Clearance: 137.1 mL/min (by C-G formula based on SCr of 0.74 mg/dL). ? ?Liver Function Tests: ?Recent Labs  ?Lab 02/28/22 ?1807 03/01/22 ?1018 03/01/22 ?1620  ?AST 46* 37 34  ?ALT 24 22 24   ?ALKPHOS 219* 215* 185*  ?BILITOT 1.3* 1.4* 1.6*  ?PROT 7.5 6.8 7.2  ?ALBUMIN 3.2* 2.9* 2.9*  ? ? ?CBG: ?Recent Labs  ?Lab 03/01/22 ?1621 03/01/22 ?1823 03/01/22 ?2135 03/02/22 ?0433 03/02/22 ?1244  ?GLUCAP 95 104* 85 114* 81  ? ? ? ?Recent Results (from the past 240 hour(s))  ?MRSA Next Gen by PCR, Nasal     Status: None  ? Collection Time: 02/21/22 12:00  AM  ? Specimen: Nasal Mucosa; Nasal Swab  ?Result Value Ref Range Status  ? MRSA by PCR Next Gen NOT DETECTED NOT DETECTED Final  ?  Comment: (NOTE) ?The GeneXpert MRSA Assay (FDA approved for NASAL specimens only), ?is one component of a comprehens

## 2022-03-02 NOTE — Assessment & Plan Note (Signed)
-   Place back on home regimen potassium. ?

## 2022-03-02 NOTE — Plan of Care (Signed)
Pt is alert oriented x 4. Pt is on continous O2 monitoring, wearing 2L nasal cannula. Dressing to left foot changed. Sheet changed, condom cath reapplied. Pt did not wear Bipap, respiratory spoke with pt.  ? ? ?Problem: Education: ?Goal: Knowledge of General Education information will improve ?Description: Including pain rating scale, medication(s)/side effects and non-pharmacologic comfort measures ?Outcome: Progressing ?  ?Problem: Health Behavior/Discharge Planning: ?Goal: Ability to manage health-related needs will improve ?Outcome: Progressing ?  ?Problem: Clinical Measurements: ?Goal: Ability to maintain clinical measurements within normal limits will improve ?Outcome: Progressing ?Goal: Will remain free from infection ?Outcome: Progressing ?Goal: Diagnostic test results will improve ?Outcome: Progressing ?Goal: Respiratory complications will improve ?Outcome: Progressing ?Goal: Cardiovascular complication will be avoided ?Outcome: Progressing ?  ?Problem: Activity: ?Goal: Risk for activity intolerance will decrease ?Outcome: Progressing ?  ?Problem: Nutrition: ?Goal: Adequate nutrition will be maintained ?Outcome: Progressing ?  ?Problem: Coping: ?Goal: Level of anxiety will decrease ?Outcome: Progressing ?  ?Problem: Elimination: ?Goal: Will not experience complications related to bowel motility ?Outcome: Progressing ?Goal: Will not experience complications related to urinary retention ?Outcome: Progressing ?  ?Problem: Pain Managment: ?Goal: General experience of comfort will improve ?Outcome: Progressing ?  ?Problem: Safety: ?Goal: Ability to remain free from injury will improve ?Outcome: Progressing ?  ?Problem: Skin Integrity: ?Goal: Risk for impaired skin integrity will decrease ?Outcome: Progressing ?  ?

## 2022-03-02 NOTE — [place] FL2 (Signed)
?[place] MEDICAID FL2 LEVEL OF CARE SCREENING TOOL  ?  ? ?IDENTIFICATION  ?Patient Name: ?[name] Birthdate: [birth year] Sex: male Admission Date (Current Location): ?02/28/2022  ?[place] Number: ? [place] ?  Facility and Address:  ?[facility], [address], [place], [place] [address] ?     Provider Number: ?[id number]  ?Attending Physician Name and Address:  ?Rodolph Bong, MD ? Relative Name and Phone Number:  ?  ?   ?Current Level of Care: ?Hospital Recommended Level of Care: ?Skilled Nursing Facility Prior Approval Number: ?  ? ?Date Approved/Denied: ?  PASRR Number: ?[id number] A ? ?Discharge Plan: ?SNF ?  ? ?Current Diagnoses: ?Patient Active Problem List  ? Diagnosis Date Noted  ? Venous stasis dermatitis of both lower extremities 02/28/2022  ? Acute metabolic encephalopathy 02/20/2022  ? Chronic right-sided low back pain with right-sided sciatica 01/18/2022  ? Abnormal echocardiogram findings without diagnosis 01/18/2022  ? Chronic radicular low back pain 11/13/2021  ? Chronic bilateral low back pain without sciatica 09/09/2021  ? Type 2 diabetes mellitus with foot ulcer (HCC) 09/02/2021  ? Hypomagnesemia 09/02/2021  ? Critical lower limb ischemia (HCC) 05/29/2021  ? Alcohol abuse 04/28/2021  ? Diabetic foot ulcer (HCC) 04/28/2021  ? Osteomyelitis (HCC) 04/27/2021  ? Anxiety 03/17/2021  ? Amputated toe of right foot (HCC) 03/03/2020  ? Bleeding 03/03/2020  ? Alcoholic peripheral neuropathy (HCC) 10/28/2019  ? Chronic diastolic CHF (congestive heart failure) (HCC) 10/15/2019  ? Chronic foot ulcer (HCC) 10/15/2019  ? Alcoholic cirrhosis of liver without ascites (HCC) 04/27/2019  ? Obesity hypoventilation syndrome (HCC) 04/27/2019  ? Depression 04/25/2019  ? DOE (dyspnea on exertion) 04/22/2019  ? Hypothyroidism 04/22/2019  ? Anemia 02/06/2019  ? Avascular necrosis of hip, left (HCC) 11/02/2018  ? Decreased hearing of both ears 10/30/2018  ? Avascular necrosis of  hip, right (HCC) 08/16/2018  ? Bilateral leg edema 02/27/2018  ? Marijuana abuse 02/16/2018  ? Ulcer of left heel (HCC) 12/21/2016  ? Bilateral carpal tunnel syndrome 12/15/2016  ? Hyperlipidemia 11/25/2016  ? Hypertension 11/23/2016  ? History of osteomyelitis 11/23/2016  ? Morbid obesity (HCC) 11/23/2016  ? OSA (obstructive sleep apnea) 11/23/2016  ? Other hammer toe (acquired) 11/11/2013  ? Exstrophy of bladder 11/11/2013  ? ? ?Orientation RESPIRATION BLADDER Height & Weight   ?  ?Self, Time, Situation, Place ? Normal, O2 (2L O2 at night) Continent Weight:   ?Height:     ?BEHAVIORAL SYMPTOMS/MOOD NEUROLOGICAL BOWEL NUTRITION STATUS  ?    Continent Diet (heart healthy)  ?AMBULATORY STATUS COMMUNICATION OF NEEDS Skin   ?Extensive Assist Verbally Other (Comment) (open wound, left heel, kerlix dressing) ?  ?  ?  ?    ?     ?     ? ? ?Personal Care Assistance Level of Assistance  ?Bathing, Feeding, Dressing Bathing Assistance: Limited assistance ?Feeding assistance: Limited assistance ?Dressing Assistance: Limited assistance ?   ? ?Functional Limitations Info  ?    ?  ?   ? ? ?SPECIAL CARE FACTORS FREQUENCY  ?PT (By licensed PT), OT (By licensed OT)   ?  ?PT Frequency: 5x/wk ?OT Frequency: 5x/wk ?  ?  ?  ?   ? ? ?Contractures Contractures Info: Not present  ? ? ?Additional Factors Info  ?Code Status, Allergies, Insulin Sliding Scale Code Status Info: Full ?Allergies Info: Claritin (Loratadine) ?  ?Insulin Sliding Scale Info: see DC summary ?  ?   ? ?Current Medications (03/02/2022):  This is the current hospital active medication list ?Current Facility-Administered Medications  ?Medication Dose Route Frequency Provider Last Rate Last Admin  ? acetaminophen (TYLENOL) tablet 650 mg  650 mg Oral Q4H PRN Rodolph Bong, MD      ? albuterol (PROVENTIL) (2.5 MG/3ML) 0.083% nebulizer solution 2.5 mg  2.5 mg Nebulization Q2H PRN Carollee Herter, DO      ? amoxicillin-clavulanate (AUGMENTIN) 875-125 MG per tablet 1 tablet  1  tablet Oral Q12H Rodolph Bong, MD   1 tablet at 03/02/22 6301  ? aspirin EC tablet 81 mg  81 mg Oral Daily Rodolph Bong, MD   81 mg at 03/02/22 6010  ? atorvastatin (LIPITOR) tablet 80 mg  80 mg Oral QHS Rodolph Bong, MD   80 mg at 03/01/22 2117  ? enoxaparin (LOVENOX) injection 40 mg  40 mg Subcutaneous Q24H Rodolph Bong, MD   40 mg at 03/01/22 1750  ? folic acid (FOLVITE) tablet 1 mg  1 mg Oral QHS Rodolph Bong, MD   1 mg at 03/01/22 2118  ? furosemide (LASIX) tablet 40 mg  40 mg Oral Daily Rodolph Bong, MD   40 mg at 03/02/22 9323  ? gabapentin (NEURONTIN) capsule 300 mg  300 mg Oral TID Rodolph Bong, MD   300 mg at 03/02/22 5573  ? insulin aspart (novoLOG) injection 0-9 Units  0-9 Units Subcutaneous TID WC Rodolph Bong, MD      ? labetalol (NORMODYNE) injection 10 mg  10 mg Intravenous Q4H PRN Carollee Herter, DO      ? magnesium oxide (MAG-OX) tablet 400 mg  400 mg Oral Daily Rodolph Bong, MD   400 mg at 03/02/22 2202  ? melatonin tablet 3 mg  3 mg Oral Once PRN John Giovanni, MD      ? methocarbamol (ROBAXIN) tablet 500 mg  500 mg Oral Q8H PRN Rodolph Bong, MD      ? metoprolol succinate (TOPROL-XL) 24 hr tablet 25 mg  25 mg Oral Daily Rodolph Bong, MD   25 mg at 03/01/22 1751  ? nystatin (MYCOSTATIN) 100000 UNIT/ML suspension 500,000 Units  5 mL Oral QID Rodolph Bong, MD   500,000 Units at 03/02/22 [phone]  ? ondansetron (ZOFRAN) injection 4 mg  4 mg Intravenous Q6H PRN Carollee Herter, DO      ? pantoprazole (PROTONIX) EC tablet 40 mg  40 mg Oral Daily Rodolph Bong, MD   40 mg at 03/02/22 0840  ? potassium chloride SA (KLOR-CON M) CR tablet 20 mEq  20 mEq Oral BID Rodolph Bong, MD   20 mEq at 03/02/22 0623  ? saccharomyces boulardii (FLORASTOR) capsule 250 mg  250 mg Oral BID Rodolph Bong, MD   250 mg at 03/02/22 7628  ? sulfamethoxazole-trimethoprim (BACTRIM DS) 800-160 MG per tablet 1 tablet  1 tablet Oral Q12H Rodolph Bong, MD   1 tablet at 03/02/22 3151  ? thiamine (Vitamin B-1) tablet 100 mg  100 mg Oral QHS Rodolph Bong, MD   100 mg at 03/01/22 2117  ? vitamin B-12 (CYANOCOBALAMIN) tablet 100 mcg  100 mcg Oral Q2000 Rodolph Bong, MD   100 mcg at 03/01/22 2118  ? ? ? ?Discharge Medications: ?Please see discharge summary for a list of discharge medications. ? ?Relevant Imaging Results: ? ?Relevant Lab Results: ? ? ?Additional Information ?SS#: [id number] ? ?Baldemar Lenis, LCSW ? ? ? ? ?

## 2022-03-02 NOTE — Progress Notes (Signed)
Orthopedic Tech Progress Note ?Patient Details:  ?[name] ?[birth year] ?[id number] ? ?Reached out to MD about order and she said as long as patient has a shoe to help with wound. Therapy requested something we did not have in house as well as HANGER. So we added an offloading insole and pulled out the material around wound so patient could use shoe.  ? ?Ortho Devices ?Type of Ortho Device: Postop shoe/boot ?Ortho Device/Splint Location: LLE ?Ortho Device/Splint Interventions: Ordered, Application, Adjustment ?  ?Post Interventions ?Patient Tolerated: Well ?Instructions Provided: Care of device ? ?Donald Pore ?03/02/2022, 12:56 PM ? ?

## 2022-03-03 DIAGNOSIS — I5032 Chronic diastolic (congestive) heart failure: Secondary | ICD-10-CM | POA: Diagnosis not present

## 2022-03-03 DIAGNOSIS — L97522 Non-pressure chronic ulcer of other part of left foot with fat layer exposed: Secondary | ICD-10-CM | POA: Diagnosis not present

## 2022-03-03 DIAGNOSIS — G9341 Metabolic encephalopathy: Secondary | ICD-10-CM | POA: Diagnosis not present

## 2022-03-03 DIAGNOSIS — I10 Essential (primary) hypertension: Secondary | ICD-10-CM | POA: Diagnosis not present

## 2022-03-03 LAB — GLUCOSE, CAPILLARY
Glucose-Capillary: 93 mg/dL (ref 70–99)
Glucose-Capillary: 109 mg/dL — ABNORMAL HIGH (ref 70–99)

## 2022-03-03 LAB — CBC
HCT: 30.2 % — ABNORMAL LOW (ref 39.0–52.0)
Hemoglobin: 10 g/dL — ABNORMAL LOW (ref 13.0–17.0)
MCH: 31.3 pg (ref 26.0–34.0)
MCHC: 33.1 g/dL (ref 30.0–36.0)
MCV: 94.7 fL (ref 80.0–100.0)
Platelets: 223 10*3/uL (ref 150–400)
RBC: 3.19 MIL/uL — ABNORMAL LOW (ref 4.22–5.81)
RDW: 14.8 % (ref 11.5–15.5)
WBC: 4.5 10*3/uL (ref 4.0–10.5)
nRBC: 0 % (ref 0.0–0.2)

## 2022-03-03 LAB — BASIC METABOLIC PANEL
Anion gap: 8 (ref 5–15)
BUN: 7 mg/dL (ref 6–20)
CO2: 24 mmol/L (ref 22–32)
Calcium: 8.9 mg/dL (ref 8.9–10.3)
Chloride: 102 mmol/L (ref 98–111)
Creatinine, Ser: 0.83 mg/dL (ref 0.61–1.24)
GFR, Estimated: >60 mL/min (ref >60)
Glucose, Bld: 89 mg/dL (ref 70–99)
Potassium: 3.5 mmol/L (ref 3.5–5.1)
Sodium: 134 mmol/L — ABNORMAL LOW (ref 135–145)

## 2022-03-03 LAB — MAGNESIUM: Magnesium: 1.8 mg/dL (ref 1.7–2.4)

## 2022-03-03 MED ORDER — TRAZODONE HCL 50 MG PO TABS: 50.0000 mg | ORAL_TABLET | Freq: Every evening | ORAL | Status: DC | PRN

## 2022-03-03 NOTE — Progress Notes (Signed)
Attempted to call report to [facility]  Was placed on hold multiple times and told Carmelina Paddock, RN would give me a call back to receive report.  PIV removed and PTAR in process of transporting patient.  Facility made aware. ?

## 2022-03-03 NOTE — TOC Transition Note (Signed)
Transition of Care (TOC) - CM/SW Discharge Note ? ? ?Patient Details  ?Name: [name] ?MRN: [id number] ?Date of Birth: 17-Oct-1961 ? ?Transition of Care [facility]) CM/SW Contact:  ?Geralynn Ochs, LCSW ?Phone Number: ?03/03/2022, 2:38 PM ? ? ?Clinical Narrative:   CSW was able to locate other bed offers for patient, but after further investigation, patient was at [facility] under the waiver, which has expired. Patient can only return to [facility], he is unable to admit elsewhere without a qualifying stay, which he does not have. CSW met with patient to explain, and he and significant other are agreeable to return to [facility]. CSW answered questions and assisted with coordinating with significant other patient's medication orders to follow up at SNF. CSW sent discharge information to [facility] and scheduled transport with PTAR for next available. ? ?Nurse to call report to [phone], Room 124 ? ? ? ?Final next level of care: [place] ?Barriers to Discharge: Barriers Resolved ? ? ?Patient Goals and CMS Choice ?Patient states their goals for this hospitalization and ongoing recovery are:: to get rehab and get stronger ?CMS Medicare.gov Compare Post Acute Care list provided to:: Patient ?Choice offered to / list presented to : Patient ? ?Discharge Placement ?  ?           ?Patient chooses bed at: [facility] ?Patient to be transferred to facility by: PTAR ?Name of family member notified: Self, Significant Other ?Patient and family notified of of transfer: 03/03/22 ? ?Discharge Plan and Services ?  ?  ?Post Acute Care Choice: [place]          ?  ?  ?  ?  ?  ?  ?  ?  ?  ?  ? ?Social Determinants of Health (SDOH) Interventions ?  ? ? ?Readmission Risk Interventions ? ?  11/16/2021  ? 12:41 PM 03/04/2020  ?  1:26 PM  ?Readmission Risk Prevention Plan  ?Transportation Screening Complete Complete  ?PCP or Specialist Appt within 3-5 Days  Complete Complete  ?[place] or Home Care Consult Complete Complete  ?Social Work Consult for [place] Planning/Counseling Complete Complete  ?Palliative Care Screening Complete Complete  ?Medication Review Press photographer) Complete Complete  ? ? ? ? ? ?

## 2022-03-03 NOTE — Progress Notes (Signed)
Report called to Raford Pitcher, LPN of [facility].  All questions answered.   ?

## 2022-03-03 NOTE — Progress Notes (Signed)
Attempted to call report again, put on hold and nurse never answered ?

## 2022-03-03 NOTE — Progress Notes (Signed)
Physical Therapy Treatment ?Patient Details ?Name: [name] ?MRN: [id number] ?DOB: Feb 28, 1961 ?Today's Date: 03/03/2022 ? ? ?History of Present Illness 61 y.o. male admitted 5/14 for AMS from [facility], by his sign other.  Pt was given Narcan as he was found with Trazodone on his person, with no improvement in cognition.  Was placed on bipap due to somnolence, but now referred to rehab with recovery of alertness.  PMHx:  Sepsis  L diabetic foot infection/ulcer, possible early osteomyelitis, septic joint arthritis with infected tenosynovitis, HTN, obesity, HLD, prediabetes, alcoholic cirrhosis, alcohol abuse. ? ?  ?PT Comments  ? ? Pt required min assist supine to sit, mod assist sit to stand, and min assist SPT with RW. Post-op shoe in place LLE. Pt unable to maintain WB precautions during stance, hindering ability to progress gait. Pt in recliner with feet elevated at end of session. Pt is now agreeable to SNF at d/c. ?   ?Recommendations for follow up therapy are one component of a multi-disciplinary discharge planning process, led by the attending physician.  Recommendations may be updated based on patient status, additional functional criteria and insurance authorization. ? ?Follow Up Recommendations ? Skilled nursing-short term rehab (<3 hours/day) ?  ?  ?Assistance Recommended at Discharge Frequent or constant Supervision/Assistance  ?Patient can return home with the following A lot of help with bathing/dressing/bathroom;Assistance with cooking/housework;Direct supervision/assist for medications management;Assist for transportation;Help with stairs or ramp for entrance;A lot of help with walking and/or transfers ?  ?Equipment Recommendations ? None recommended by PT  ?  ?Recommendations for Other Services   ? ? ?  ?Precautions / Restrictions Precautions ?Precautions: Fall ?Restrictions ?Weight Bearing Restrictions: Yes ?LLE Weight Bearing: Touchdown weight bearing ?Other Position/Activity  Restrictions: TWB per wound care RN on 5/16; offloading shoe with insole  ?  ? ?Mobility ? Bed Mobility ?Overal bed mobility: Needs Assistance ?Bed Mobility: Supine to Sit ?  ?  ?Supine to sit: Min assist, HOB elevated ?  ?  ?General bed mobility comments: +rail, increased time, use of bed pad to scoot to EOB ?  ? ?Transfers ?Overall transfer level: Needs assistance ?Equipment used: Rolling walker (2 wheels) ?Transfers: Bed to chair/wheelchair/BSC, Sit to/from Stand ?Sit to Stand: Mod assist ?  ?Step pivot transfers: Min assist ?  ?  ?  ?General transfer comment: assist to power up and stabilize balance. Pivot steps with RW toward R. Increased time. Post-op shoe in place L. Pt unable to adhere to WB precautions. ?  ? ?Ambulation/Gait ?  ?  ?  ?  ?  ?  ?  ?General Gait Details: unable to attempt due to inability to maintain TDWB LLE ? ? ?Stairs ?  ?  ?  ?  ?  ? ? ?Wheelchair Mobility ?  ? ?Modified Rankin (Stroke Patients Only) ?  ? ? ?  ?Balance Overall balance assessment: Needs assistance ?Sitting-balance support: No upper extremity supported, Feet supported ?Sitting balance-Leahy Scale: Fair ?  ?  ?Standing balance support: Bilateral upper extremity supported, During functional activity, Reliant on assistive device for balance ?Standing balance-Leahy Scale: Poor ?  ?  ?  ?  ?  ?  ?  ?  ?  ?  ?  ?  ?  ? ?  ?Cognition Arousal/Alertness: Awake/alert ?Behavior During Therapy: [facility] for tasks assessed/performed ?Overall Cognitive Status: Impaired/Different from baseline ?Area of Impairment: Problem solving, Awareness, Safety/judgement, Following commands, Memory, Attention ?  ?  ?  ?  ?  ?  ?  ?  ?  ?  Current Attention Level: Selective ?Memory: Decreased recall of precautions ?Following Commands: Follows one step commands with increased time ?Safety/Judgement: Decreased awareness of safety, Decreased awareness of deficits ?Awareness: Emergent ?Problem Solving: Slow processing, Requires verbal cues, Requires tactile cues ?   ?  ?  ? ?  ?Exercises   ? ?  ?General Comments General comments (skin integrity, edema, etc.): VSS on RA ?  ?  ? ?Pertinent Vitals/Pain Pain Assessment ?Pain Assessment: No/denies pain  ? ? ?Home Living   ?  ?  ?  ?  ?  ?  ?  ?  ?  ?   ?  ?Prior Function    ?  ?  ?   ? ?PT Goals (current goals can now be found in the care plan section) Acute Rehab PT Goals ?Patient Stated Goal: home ?Progress towards PT goals: Progressing toward goals ? ?  ?Frequency ? ? ? Min 2X/week ? ? ? ?  ?PT Plan Frequency needs to be updated  ? ? ?Co-evaluation   ?  ?  ?  ?  ? ?  ?AM-PAC PT "6 Clicks" Mobility   ?Outcome Measure ? Help needed turning from your back to your side while in a flat bed without using bedrails?: A Little ?Help needed moving from lying on your back to sitting on the side of a flat bed without using bedrails?: A Little ?Help needed moving to and from a bed to a chair (including a wheelchair)?: A Lot ?Help needed standing up from a chair using your arms (e.g., wheelchair or bedside chair)?: A Lot ?Help needed to walk in hospital room?: Total ?Help needed climbing 3-5 steps with a railing? : Total ?6 Click Score: 12 ? ?  ?End of Session Equipment Utilized During Treatment: Gait belt ?Activity Tolerance: Patient tolerated treatment well ?Patient left: in chair;with call bell/phone within reach;with chair alarm set ?Nurse Communication: Mobility status ?PT Visit Diagnosis: Unsteadiness on feet (R26.81);Muscle weakness (generalized) (M62.81);Difficulty in walking, not elsewhere classified (R26.2);Other abnormalities of gait and mobility (R26.89);Other (comment) ?  ? ? ?Time: 9323-5573 ?PT Time Calculation (min) (ACUTE ONLY): 16 min ? ?Charges:  $Therapeutic Activity: 8-22 mins          ?          ? ?Aida Raider, PT  ?Office # [phone] ?Pager [phone] ? ? ? ?Ilda Foil ?03/03/2022, 10:25 AM ? ?

## 2022-03-03 NOTE — TOC Initial Note (Signed)
Transition of Care (TOC) - Initial/Assessment Note  ? ? ?Patient Details  ?Name: [name] ?MRN: [id number] ?Date of Birth: 04-20-1961 ? ?Transition of Care [facility]) CM/SW Contact:    ?Geralynn Ochs, LCSW ?Phone Number: ?03/03/2022, 2:36 PM ? ?Clinical Narrative:           CSW alerted by MD that patient would like to try to find another SNF at discharge, not happy with [facility]. CSW met with patient to confirm and ask if he knew where he'd like to go. Patient does not have a preference, and is willing to go outside of the [place] area if need be to find a good place. CSW faxed out referral, and faxed out further to try to locate a SNF for patient. CSW confirmed that [facility] can admit patient back.      ? ? ?Expected Discharge Plan: [place] ?Barriers to Discharge: Continued Medical Work up ? ? ?Patient Goals and CMS Choice ?Patient states their goals for this hospitalization and ongoing recovery are:: to get rehab and get stronger ?CMS Medicare.gov Compare Post Acute Care list provided to:: Patient ?Choice offered to / list presented to : Patient ? ?Expected Discharge Plan and Services ?Expected Discharge Plan: [place] ?  ?  ?Post Acute Care Choice: [place] ?Living arrangements for the past 2 months: [place], [place] ?Expected Discharge Date: 03/03/22               ?  ?  ?  ?  ?  ?  ?  ?  ?  ?  ? ?Prior Living Arrangements/Services ?Living arrangements for the past 2 months: [place], [place] ?Lives with:: Significant Other ?Patient language and need for interpreter reviewed:: No ?Do you feel safe going back to the place where you live?: Yes      ?Need for Family Participation in Patient Care: No (Comment) ?Care giver support system in place?: No (comment) ?  ?Criminal Activity/Legal Involvement Pertinent to Current Situation/Hospitalization: No - Comment as needed ? ?Activities of  Daily Living ?Home Assistive Devices/Equipment: [name] (specify type), Wheelchair ?ADL Screening (condition at time of admission) ?Patient's cognitive ability adequate to safely complete daily activities?: Yes ?Is the patient deaf or have difficulty hearing?: No ?Does the patient have difficulty seeing, even when wearing glasses/contacts?: No ?Does the patient have difficulty concentrating, remembering, or making decisions?: No ?Patient able to express need for assistance with ADLs?: Yes ?Does the patient have difficulty dressing or bathing?: Yes ?Independently performs ADLs?: No ?Does the patient have difficulty walking or climbing stairs?: Yes ?Weakness of Legs: Both ?Weakness of Arms/Hands: Both ? ?Permission Sought/Granted ?Permission sought to share information with : Customer service manager ?  ?   ? Permission granted to share info w AGENCY: SNF ?   ?   ? ?Emotional Assessment ?Appearance:: Appears stated age ?Attitude/Demeanor/Rapport: Engaged ?Affect (typically observed): Appropriate ?Orientation: : Oriented to Self, Oriented to Place, Oriented to  Time, Oriented to Situation ?Alcohol / Substance Use: Alcohol Use ?Psych Involvement: No (comment) ? ?Admission diagnosis:  Somnolence [R40.0] ?Acute metabolic encephalopathy [Y65.03] ?Patient Active Problem List  ? Diagnosis Date Noted  ? Venous stasis dermatitis of both lower extremities 02/28/2022  ? Acute metabolic encephalopathy 54/65/6812  ? Chronic right-sided low back pain with right-sided sciatica 01/18/2022  ? Abnormal echocardiogram findings without diagnosis 01/18/2022  ? Chronic radicular low back pain 11/13/2021  ? Chronic bilateral low back pain without  sciatica 09/09/2021  ? Type 2 diabetes mellitus with foot ulcer ([place]) 09/02/2021  ? Hypokalemia 09/02/2021  ? Hypomagnesemia 09/02/2021  ? Critical lower limb ischemia ([place]) 05/29/2021  ? Alcohol abuse 04/28/2021  ? Diabetic foot ulcer ([place]) 04/28/2021  ? Osteomyelitis ([place]) 04/27/2021  ?  Anxiety 03/17/2021  ? Amputated toe of right foot ([place]) 03/03/2020  ? Bleeding 03/03/2020  ? Alcoholic peripheral neuropathy ([place]) 10/28/2019  ? Chronic diastolic CHF (congestive heart failure) ([place]) 10/15/2019  ? Chronic foot ulcer ([place]) 10/15/2019  ? Alcoholic cirrhosis of liver without ascites ([place]) 04/27/2019  ? Obesity hypoventilation syndrome ([place]) 04/27/2019  ? Depression 04/25/2019  ? DOE (dyspnea on exertion) 04/22/2019  ? Hypothyroidism 04/22/2019  ? Normocytic anemia 02/06/2019  ? Avascular necrosis of hip, left ([place]) 11/02/2018  ? Decreased hearing of both ears 10/30/2018  ? Avascular necrosis of hip, right ([place]) 08/16/2018  ? Bilateral leg edema 02/27/2018  ? Marijuana abuse 02/16/2018  ? Ulcer of left heel ([place]) 12/21/2016  ? Bilateral carpal tunnel syndrome 12/15/2016  ? Hyperlipidemia 11/25/2016  ? Hypertension 11/23/2016  ? History of osteomyelitis 11/23/2016  ? Morbid obesity ([place]) 11/23/2016  ? OSA (obstructive sleep apnea) 11/23/2016  ? Other hammer toe (acquired) 11/11/2013  ? Exstrophy of bladder 11/11/2013  ? ?PCP:  Binnie Rail, MD ?Pharmacy:   ?[id number] [id number]?Phone: [phone]: [phone]? ? ? ? ?Social Determinants of Health (SDOH) Interventions ?  ? ?Readmission Risk Interventions ? ?  11/16/2021  ? 12:41 PM 03/04/2020  ?  1:26 PM  ?Readmission Risk Prevention Plan  ?Transportation Screening Complete Complete  ?PCP or Specialist Appt within 3-5 Days Complete Complete  ?[place] Consult Complete Complete  ?Social Work Consult for [place]/Counseling Complete Complete  ?Palliative Care Screening Complete Complete  ?Medication Review (Press photographer Complete Complete  ? ? ? ?

## 2022-03-03 NOTE — Discharge Summary (Signed)
?Physician Discharge Summary ?  ?Patient: [name] MRN: [id number] DOB: [birth year]  ?Admit date:     02/28/2022  ?Discharge date: 03/03/22  ?Discharge Physician: Alberteen Sam  ? ?PCP: [name], MD  ? ? ? ?Recommendations at discharge:  ?[facility]: Please arrange Infectious Disease follow up with Dr. Daiva Eves as soon as able ?[facility]: Please arrange Podiatry follow up with Dr. Logan Bores or Dr. Allena Katz as soon as able ?Take Augmentin 875-125 BID until changed by Infectious Disease ?Avoid trazodone, Librium ? ? ? ? ? ? ?Discharge Diagnoses: ?Principal Problem: ?  Acute toxic encephalopathy due to trazodone, Librium ?Active Problems: ?  Chronic foot ulcer ?  History of osteomyelitis ?  Venous stasis dermatitis of both lower extremities ?  Hypertension ?  Morbid obesity  ?  OSA (obstructive sleep apnea) ?  Hyperlipidemia ?  Obesity hypoventilation syndrome (HCC) ?  Chronic diastolic CHF (congestive heart failure) (HCC) ?  Type 2 diabetes mellitus with foot ulcer (HCC) ?  Normocytic anemia ?  Hypokalemia ?  Hypomagnesemia ? ?  ? ? ?Hospital Course: ?[name] is a  61 y.o. M with chronic venous insufficiency, Charcot foot, chronic left heel ulcer and chronic osteomyelitis lost to ID follow up, missed Wound Center visits, sCHF EF 45-50%, diet-controlled DM, OSA not on CPAP who presented with somnolence, sleepiness, difficulty waking.   ? ?In the ER, ABG showed normal CO2.  He was severely somnolent, not-responsive to Narcan and started on BiPAP ? ? ? ? ?Acute toxic encephalopathy due to trazodone and Librium ?MAR showed Librium which patient had not been prescribed at discharge that I can see and was an old medication. Patient also reported being given excess trazodone by his facility and denies taking his home supply of trazodone. ? ?Either way, here, neuraxial imaging normal. Ammonia and ABG normal.  Narcan no effect.  Trazodone and Librium were held and his mentation returned to normal.    ? ?May use gabapentin.  Avoid trazodone, benzodiazepines. ? ? ? ?Venous stasis dermatitis of both lower extremities ?History of osteomyelitis ?Chronic foot ulcer (HCC) ?Patient apparently lost to follow-up after his December 2022 infectious disease appointment.  I personally discussed with his wound care provider [facility], confirmed patient has missed numerous appointments. ? ?Patient expressed desire to avoid amputation.  We had frank discussion about close follow-up with ID, wound care center in order to avert this outcome. ? ?From last note with infectious disease, he was on Augmentin alone at that time, and there was plan for close follow-up.  His blood counts and renal function of been normal here, and I stressed that he absolutely must follow-up closely with ID in order to continue this medication. ? ?I also recommend resumption of care at the wound care center, and close follow-up with podiatry for possible debridement. ? ? ?  ? ?  ?Type 2 Diabetes  ?A1c >6.5% in the last 2-3 years.  Patient disputes this diagnosis and is currently diet controlled. ? ?Chronic systolic and diastolic CHF (congestive heart failure) (HCC) ?Last EF 45-50% and grade III DD.  Here, relatively euvolemic.    ? ?Obesity hypoventilation syndrome (HCC) ?BiPAP ordered nightly, however patient noted to be refusing.  Note that outpatient PSG is being ordered by PCP.  Agree with this plan ? ?Morbid obesity (HCC) ?BMI 37 with hypertension, diabetes. ? ?  ? ? ? ?  ? ? ? ? ? ?The [facility] Controlled Substances Registry was reviewed for this patient  prior to discharge.  ? ? ?Disposition: Skilled nursing facility ?Diet recommendation:  ?Discharge Diet Orders (From admission, onward)  ? ?  Start     Ordered  ? 03/03/22 0000  Diet - low sodium heart healthy       ? 03/03/22 1254  ? ?  ?  ? ?  ? ? ? ?DISCHARGE MEDICATION: ?Allergies as of 03/03/2022   ? ?   Reactions  ? Claritin [loratadine] Shortness Of Breath, Anxiety  ? ?  ? ?   ?Medication List  ?  ? ?STOP taking these medications   ? ?LIBRIUM PO ?  ?methocarbamol 500 MG tablet ?Commonly known as: ROBAXIN ?  ?sulfamethoxazole-trimethoprim 800-160 MG tablet ?Commonly known as: BACTRIM DS ?  ?traZODone 50 MG tablet ?Commonly known as: DESYREL ?  ? ?  ? ?TAKE these medications   ? ?acamprosate 333 MG tablet ?Commonly known as: CAMPRAL ?Take 333 mg by mouth 2 (two) times daily. ?  ?acetaminophen 325 MG tablet ?Commonly known as: TYLENOL ?Take 650 mg by mouth every 4 (four) hours as needed for moderate pain. ?  ?amLODipine 5 MG tablet ?Commonly known as: NORVASC ?Take 5 mg by mouth daily. ?  ?amoxicillin-clavulanate 875-125 MG tablet ?Commonly known as: AUGMENTIN ?Take 1 tablet by mouth in the morning and at bedtime. For ID Prophylaxis ?  ?aspirin 81 MG EC tablet ?Take 1 tablet (81 mg total) by mouth daily. Swallow whole. ?  ?atorvastatin 80 MG tablet ?Commonly known as: Lipitor ?Take 1 tablet (80 mg total) by mouth daily. ?What changed: when to take this ?  ?folic acid 1 MG tablet ?Commonly known as: FOLVITE ?TAKE 1 TABLET BY MOUTH EVERY DAY ?  ?furosemide 40 MG tablet ?Commonly known as: LASIX ?Take 1 tablet (40 mg total) by mouth daily. ?  ?gabapentin 300 MG capsule ?Commonly known as: NEURONTIN ?Take 300 mg by mouth 3 (three) times daily. ?  ?losartan 25 MG tablet ?Commonly known as: COZAAR ?Take 25 mg by mouth daily. ?  ?Magnesium 400 MG Tabs ?Take 400 mg by mouth daily. ?  ?metoprolol succinate 25 MG 24 hr tablet ?Commonly known as: TOPROL-XL ?Take 1 tablet (25 mg total) by mouth daily. ?  ?nitroGLYCERIN 0.4 MG SL tablet ?Commonly known as: NITROSTAT ?Place 1 tablet (0.4 mg total) under the tongue every 5 (five) minutes x 3 doses as needed for chest pain. ?  ?pantoprazole 40 MG tablet ?Commonly known as: PROTONIX ?Take 40 mg by mouth daily. ?  ?potassium chloride SA 20 MEQ tablet ?Commonly known as: KLOR-CON M ?Take 20 mEq by mouth 2 (two) times daily. ?  ?saccharomyces boulardii 250 MG  capsule ?Commonly known as: FLORASTOR ?Take 250 mg by mouth 2 (two) times daily. ?  ?thiamine 100 MG tablet ?Commonly known as: Vitamin B-1 ?Take 100 mg by mouth daily. ?  ?vitamin B-12 100 MCG tablet ?Commonly known as: CYANOCOBALAMIN ?Take 100 mcg by mouth daily. ?  ? ?  ? ?  ?  ? ? ?  ?Discharge Care Instructions  ?(From admission, onward)  ?  ? ? ?  ? ?  Start     Ordered  ? 03/03/22 0000  Discharge wound care:       ?Comments: Change dressing to left foot once daily ?Cleanse left heel sound with normal saline and pat dry ?Apply Aquacel Ag to wound bed.  ?Cover with gauze and Kerlix from below toes to below knee ?Secure with ace wrap and elevate legs  ? 03/03/22  1254  ? 03/03/22 0000  Discharge wound care:       ?Comments: See prior  ? 03/03/22 1408  ? ?  ?  ? ?  ? ? Follow-up Information   ? ? Pincus SanesBurns, Stacy J, MD. Schedule an appointment as soon as possible for a visit.   ?Specialty: Internal Medicine ?Why: Schedule an appointment within 1 week after discharge from rehab ?Contact information: ?[address] ?[place] [address] ?[phone] ? ? ?  ?  ? ?  ?  ? ?  ? ? ?Discharge Instructions   ? ? Diet - low sodium heart healthy   Complete by: As directed ?  ? Discharge instructions   Complete by: As directed ?  ? Continue antibiotic Augmentin (amoxicillin-clavulanate) ?Go see Dr. Daiva EvesVan Dam from Infectious Disease ?He needs to actively manage this medicine long term ?Do not miss appointments with him ? ?Also call Dr. Logan BoresEvans (or Dr. Allena KatzPatel) for a follow up appointment with Podiatry (foot surgery) ?You have an appointment already scheduled in late June with Dr. Allena KatzPatel (see below) but call them to ask if you can get that appoitnment moved up ? ?Ask them if you have any restrictions on bearing weight on that foot ? ?I also recommend you re-establish with [facility] DerryHoyt Stone and keep those appointments religiously  ? Discharge wound care:   Complete by: As directed ?  ? Change dressing to left foot once daily ?Cleanse left  heel sound with normal saline and pat dry ?Apply Aquacel Ag to wound bed.  ?Cover with gauze and Kerlix from below toes to below knee ?Secure with ace wrap and elevate legs  ? Discharge wound care:   Complete by

## 2022-03-06 ENCOUNTER — Other Ambulatory Visit: Payer: Self-pay | Admitting: Internal Medicine

## 2022-03-10 ENCOUNTER — Other Ambulatory Visit: Payer: Self-pay

## 2022-03-10 ENCOUNTER — Ambulatory Visit (INDEPENDENT_AMBULATORY_CARE_PROVIDER_SITE_OTHER): Payer: Medicare Other | Admitting: Infectious Disease

## 2022-03-10 ENCOUNTER — Encounter: Payer: Self-pay | Admitting: Infectious Disease

## 2022-03-10 VITALS — BP 120/75 | HR 96 | Temp 98.3°F

## 2022-03-10 DIAGNOSIS — I10 Essential (primary) hypertension: Secondary | ICD-10-CM

## 2022-03-10 DIAGNOSIS — M87052 Idiopathic aseptic necrosis of left femur: Secondary | ICD-10-CM | POA: Diagnosis not present

## 2022-03-10 DIAGNOSIS — M86171 Other acute osteomyelitis, right ankle and foot: Secondary | ICD-10-CM | POA: Diagnosis present

## 2022-03-10 DIAGNOSIS — L089 Local infection of the skin and subcutaneous tissue, unspecified: Secondary | ICD-10-CM

## 2022-03-10 DIAGNOSIS — M87051 Idiopathic aseptic necrosis of right femur: Secondary | ICD-10-CM

## 2022-03-10 DIAGNOSIS — I5033 Acute on chronic diastolic (congestive) heart failure: Secondary | ICD-10-CM

## 2022-03-10 DIAGNOSIS — E11628 Type 2 diabetes mellitus with other skin complications: Secondary | ICD-10-CM

## 2022-03-10 DIAGNOSIS — F1029 Alcohol dependence with unspecified alcohol-induced disorder: Secondary | ICD-10-CM
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
## 2022-03-11 LAB — CBC WITH DIFFERENTIAL/PLATELET
Absolute Monocytes: 517 cells/uL (ref 200–950)
Basophils Absolute: 48 cells/uL (ref 0–200)
Basophils Relative: 0.7 %
Eosinophils Absolute: 61 cells/uL (ref 15–500)
Eosinophils Relative: 0.9 %
HCT: 33.3 % — ABNORMAL LOW (ref 38.5–50.0)
Hemoglobin: 11.3 g/dL — ABNORMAL LOW (ref 13.2–17.1)
Lymphs Abs: 1306 cells/uL (ref 850–3900)
MCH: 31.1 pg (ref 27.0–33.0)
MCHC: 33.9 g/dL (ref 32.0–36.0)
MCV: 91.7 fL (ref 80.0–100.0)
MPV: 9.4 fL (ref 7.5–12.5)
Monocytes Relative: 7.6 %
Neutro Abs: 4869 cells/uL (ref 1500–7800)
Neutrophils Relative %: 71.6 %
Platelets: 279 10*3/uL (ref 140–400)
RBC: 3.63 10*6/uL — ABNORMAL LOW (ref 4.20–5.80)
RDW: 13.8 % (ref 11.0–15.0)
Total Lymphocyte: 19.2 %
WBC: 6.8 10*3/uL (ref 3.8–10.8)

## 2022-03-11 LAB — C-REACTIVE PROTEIN: CRP: 57.9 mg/L — ABNORMAL HIGH (ref <8.0)

## 2022-03-11 LAB — COMPLETE METABOLIC PANEL WITH GFR
AG Ratio: 0.9 (calc) — ABNORMAL LOW (ref 1.0–2.5)
ALT: 17 U/L (ref 9–46)
AST: 26 U/L (ref 10–35)
Albumin: 3.5 g/dL — ABNORMAL LOW (ref 3.6–5.1)
Alkaline phosphatase (APISO): 234 U/L — ABNORMAL HIGH (ref 35–144)
BUN: 13 mg/dL (ref 7–25)
CO2: 23 mmol/L (ref 20–32)
Calcium: 9.6 mg/dL (ref 8.6–10.3)
Chloride: 102 mmol/L (ref 98–110)
Creat: 0.83 mg/dL (ref 0.70–1.35)
Globulin: 4 g/dL (calc) — ABNORMAL HIGH (ref 1.9–3.7)
Glucose, Bld: 93 mg/dL (ref 65–99)
Potassium: 3.5 mmol/L (ref 3.5–5.3)
Sodium: 138 mmol/L (ref 135–146)
Total Bilirubin: 1.4 mg/dL — ABNORMAL HIGH (ref 0.2–1.2)
Total Protein: 7.5 g/dL (ref 6.1–8.1)
eGFR: 100 mL/min/{1.73_m2} (ref >=60)

## 2022-03-11 LAB — SEDIMENTATION RATE: Sed Rate: 111 mm/h — ABNORMAL HIGH (ref 0–20)

## 2022-03-17 ENCOUNTER — Telehealth: Payer: Self-pay | Admitting: *Deleted

## 2022-03-17 NOTE — Chronic Care Management (AMB) (Signed)
  Chronic Care Management Note  03/17/2022 Name: [name] MRN: [id number] DOB: 11/08/1960  [name] is a 62 y.o. year old male who is a primary care patient of Lawerance Bach, Bobette Mo, MD and is actively engaged with the care management team. I reached out to [name] by phone today to assist with re-scheduling a follow up visit with the RN Case Manager  Follow up plan: Unsuccessful telephone outreach attempt made. A HIPAA compliant phone message was left for the patient providing contact information and requesting a return call.  The care management team will reach out to the patient again over the next 7 days.  If patient returns call to provider office, please advise to call Embedded Care Management Care Guide Misty Stanley at [phone].  Gwenevere Ghazi  Care Guide, Embedded Care Coordination [facility] Management  Direct Dial: [phone]

## 2022-03-23 ENCOUNTER — Other Ambulatory Visit: Payer: Self-pay | Admitting: Internal Medicine

## 2022-03-24 NOTE — Chronic Care Management (AMB) (Signed)
  Chronic Care Management Note  03/24/2022 Name: Juanmiguel Defelice MRN: [id number] DOB: [birth year]  Antrell Tipler is a 61 y.o. year old male who is a primary care patient of Lawerance Bach, Bobette Mo, MD and is actively engaged with the care management team. I reached out to [name] by phone today to assist with scheduling a follow up visit with the RN Case Manager  Follow up plan: Telephone appointment with care management team member scheduled for:05/13/22  [facility] Guide, Embedded Care Coordination [facility]  Care Management  Direct Dial: [phone]

## 2022-03-29 ENCOUNTER — Encounter: Payer: Self-pay | Admitting: Adult Health

## 2022-03-29 ENCOUNTER — Ambulatory Visit (INDEPENDENT_AMBULATORY_CARE_PROVIDER_SITE_OTHER): Payer: Medicare Other | Admitting: Adult Health

## 2022-03-29 VITALS — BP 110/80 | HR 102 | Temp 98.2°F | Ht 73.0 in | Wt 247.0 lb

## 2022-03-29 DIAGNOSIS — G4733 Obstructive sleep apnea (adult) (pediatric): Secondary | ICD-10-CM

## 2022-03-29 DIAGNOSIS — J9611 Chronic respiratory failure with hypoxia: Secondary | ICD-10-CM

## 2022-03-29 NOTE — Patient Instructions (Addendum)
Set up for split night sleep study  Work on healthy sleep regimen  Do not drive if sleepy  Work on healthy weight loss.  Continue on Oxygen  At bedtime   Follow up in 6-8 weeks to discuss results and treatment plan .

## 2022-03-29 NOTE — Assessment & Plan Note (Signed)
Nocturnal hypoxemia now on oxygen.  We will set patient up for a in lab sleep study to see if sleep apnea is contributing.  Continue on oxygen at bedtime currently

## 2022-03-29 NOTE — Assessment & Plan Note (Signed)
Previous diagnosis of sleep apnea, nocturnal hypoxemia, BMI 32, high Epworth score at 14, restless sleep, daytime sleepiness all suspicious that patient continues to have ongoing sleep apnea.  Patient has a history of congestive heart failure with multiple comorbidities will need an in lab sleep study.  We will set patient up for a split-night sleep study  - discussed how weight can impact sleep and risk for sleep disordered breathing - discussed options to assist with weight loss: combination of diet modification, cardiovascular and strength training exercises   - had an extensive discussion regarding the adverse health consequences related to untreated sleep disordered breathing - specifically discussed the risks for hypertension, coronary artery disease, cardiac dysrhythmias, cerebrovascular disease, and diabetes - lifestyle modification discussed   - discussed how sleep disruption can increase risk of accidents, particularly when driving - safe driving practices were discussed   Plan  Patient Instructions  Set up for split night sleep study  Work on healthy sleep regimen  Do not drive if sleepy  Work on healthy weight loss.  Continue on Oxygen  At bedtime   Follow up in 6-8 weeks to discuss results and treatment plan .

## 2022-03-29 NOTE — Progress Notes (Signed)
Reviewed and agree with assessment/plan.   Chesley Mires, MD [facility] Pulmonary/Critical Care 03/29/2022, 4:15 PM Pager:  [phone]
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
## 2022-03-29 NOTE — Assessment & Plan Note (Signed)
Healthy weight loss discussed 

## 2022-04-06 ENCOUNTER — Other Ambulatory Visit: Payer: Self-pay | Admitting: Internal Medicine

## 2022-04-14 ENCOUNTER — Ambulatory Visit (INDEPENDENT_AMBULATORY_CARE_PROVIDER_SITE_OTHER): Payer: Medicare Other | Admitting: Podiatry

## 2022-04-14 DIAGNOSIS — M14672 Charcot's joint, left ankle and foot: Secondary | ICD-10-CM | POA: Diagnosis not present

## 2022-04-14 DIAGNOSIS — L97522 Non-pressure chronic ulcer of other part of left foot with fat layer exposed: Secondary | ICD-10-CM | POA: Diagnosis not present

## 2022-04-15 ENCOUNTER — Other Ambulatory Visit: Payer: Self-pay

## 2022-04-15 ENCOUNTER — Encounter: Payer: Self-pay | Admitting: Infectious Disease

## 2022-04-15 ENCOUNTER — Ambulatory Visit (INDEPENDENT_AMBULATORY_CARE_PROVIDER_SITE_OTHER): Payer: Medicare Other | Admitting: Infectious Disease

## 2022-04-15 VITALS — BP 121/83 | HR 71 | Temp 97.9°F

## 2022-04-15 DIAGNOSIS — L089 Local infection of the skin and subcutaneous tissue, unspecified: Secondary | ICD-10-CM | POA: Diagnosis not present

## 2022-04-15 DIAGNOSIS — G8929 Other chronic pain: Secondary | ICD-10-CM

## 2022-04-15 DIAGNOSIS — M86171 Other acute osteomyelitis, right ankle and foot: Secondary | ICD-10-CM

## 2022-04-15 DIAGNOSIS — M545 Low back pain, unspecified: Secondary | ICD-10-CM

## 2022-04-15 DIAGNOSIS — G621 Alcoholic polyneuropathy: Secondary | ICD-10-CM

## 2022-04-15 DIAGNOSIS — E11628 Type 2 diabetes mellitus with other skin complications: Secondary | ICD-10-CM | POA: Diagnosis not present

## 2022-04-15 HISTORY — DX: Low back pain, unspecified: M54.50

## 2022-04-15 MED ORDER — AMOXICILLIN-POT CLAVULANATE 875-125 MG PO TABS: 1.0000 | ORAL_TABLET | Freq: Two times a day (BID) | ORAL | 11 refills | Status: DC
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
## 2022-04-16 LAB — CBC WITH DIFFERENTIAL/PLATELET
Absolute Monocytes: 365 cells/uL (ref 200–950)
Basophils Absolute: 19 cells/uL (ref 0–200)
Basophils Relative: 0.4 %
Eosinophils Absolute: 48 cells/uL (ref 15–500)
Eosinophils Relative: 1 %
HCT: 40.8 % (ref 38.5–50.0)
Hemoglobin: 13.3 g/dL (ref 13.2–17.1)
Lymphs Abs: 1565 cells/uL (ref 850–3900)
MCH: 29.4 pg (ref 27.0–33.0)
MCHC: 32.6 g/dL (ref 32.0–36.0)
MCV: 90.1 fL (ref 80.0–100.0)
MPV: 10 fL (ref 7.5–12.5)
Monocytes Relative: 7.6 %
Neutro Abs: 2803 cells/uL (ref 1500–7800)
Neutrophils Relative %: 58.4 %
Platelets: 252 10*3/uL (ref 140–400)
RBC: 4.53 10*6/uL (ref 4.20–5.80)
RDW: 13.2 % (ref 11.0–15.0)
Total Lymphocyte: 32.6 %
WBC: 4.8 10*3/uL (ref 3.8–10.8)

## 2022-04-16 LAB — BASIC METABOLIC PANEL WITHOUT GFR
BUN/Creatinine Ratio: 15 (calc) (ref 6–22)
BUN: 10 mg/dL (ref 7–25)
CO2: 20 mmol/L (ref 20–32)
Calcium: 9.7 mg/dL (ref 8.6–10.3)
Chloride: 106 mmol/L (ref 98–110)
Creat: 0.66 mg/dL — ABNORMAL LOW (ref 0.70–1.35)
Glucose, Bld: 98 mg/dL (ref 65–99)
Potassium: 3.7 mmol/L (ref 3.5–5.3)
Sodium: 138 mmol/L (ref 135–146)
eGFR: 107 mL/min/1.73m2 (ref >=60)

## 2022-04-16 LAB — SEDIMENTATION RATE: Sed Rate: 38 mm/h — ABNORMAL HIGH (ref 0–20)

## 2022-04-16 LAB — C-REACTIVE PROTEIN: CRP: 6.5 mg/L (ref <8.0)
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
## 2022-04-26 ENCOUNTER — Other Ambulatory Visit: Payer: Self-pay | Admitting: Podiatry

## 2022-04-29 ENCOUNTER — Ambulatory Visit (HOSPITAL_BASED_OUTPATIENT_CLINIC_OR_DEPARTMENT_OTHER): Payer: Medicare Other | Attending: Pulmonary Disease | Admitting: Pulmonary Disease

## 2022-04-29 ENCOUNTER — Ambulatory Visit (INDEPENDENT_AMBULATORY_CARE_PROVIDER_SITE_OTHER): Payer: Medicare Other | Admitting: Podiatry

## 2022-04-29 DIAGNOSIS — G4733 Obstructive sleep apnea (adult) (pediatric): Secondary | ICD-10-CM | POA: Diagnosis present

## 2022-04-29 DIAGNOSIS — L97522 Non-pressure chronic ulcer of other part of left foot with fat layer exposed: Secondary | ICD-10-CM | POA: Diagnosis not present

## 2022-04-29 DIAGNOSIS — M14672 Charcot's joint, left ankle and foot: Secondary | ICD-10-CM

## 2022-05-03 ENCOUNTER — Ambulatory Visit (HOSPITAL_COMMUNITY)
Admission: RE | Admit: 2022-05-03 | Discharge: 2022-05-03 | Disposition: A | Discharge status: Home / Self Care | Payer: Medicare Other | Source: Ambulatory Visit | Attending: Infectious Disease | Admitting: Infectious Disease

## 2022-05-03 ENCOUNTER — Other Ambulatory Visit: Payer: Self-pay | Admitting: Infectious Disease

## 2022-05-03 DIAGNOSIS — M86172 Other acute osteomyelitis, left ankle and foot: Secondary | ICD-10-CM | POA: Diagnosis present

## 2022-05-03 DIAGNOSIS — M86171 Other acute osteomyelitis, right ankle and foot: Secondary | ICD-10-CM | POA: Insufficient documentation

## 2022-05-03 MED ORDER — GADOBUTROL 1 MMOL/ML IV SOLN
10.0000 mL | Freq: Once | INTRAVENOUS | Status: AC | PRN
  Administered 2022-05-03: 10 mL via INTRAVENOUS

## 2022-05-04 ENCOUNTER — Telehealth: Payer: Self-pay | Admitting: Adult Health

## 2022-05-04 ENCOUNTER — Encounter (HOSPITAL_BASED_OUTPATIENT_CLINIC_OR_DEPARTMENT_OTHER): Payer: Medicare Other | Attending: Internal Medicine | Admitting: Internal Medicine

## 2022-05-04 ENCOUNTER — Telehealth: Payer: Self-pay

## 2022-05-04 DIAGNOSIS — E1169 Type 2 diabetes mellitus with other specified complication: Secondary | ICD-10-CM | POA: Insufficient documentation

## 2022-05-04 DIAGNOSIS — G4733 Obstructive sleep apnea (adult) (pediatric): Secondary | ICD-10-CM | POA: Diagnosis not present

## 2022-05-04 DIAGNOSIS — M14672 Charcot's joint, left ankle and foot: Secondary | ICD-10-CM | POA: Diagnosis not present

## 2022-05-04 DIAGNOSIS — L97522 Non-pressure chronic ulcer of other part of left foot with fat layer exposed: Secondary | ICD-10-CM | POA: Insufficient documentation

## 2022-05-04 DIAGNOSIS — M869 Osteomyelitis, unspecified: Secondary | ICD-10-CM | POA: Diagnosis not present

## 2022-05-04 DIAGNOSIS — E11621 Type 2 diabetes mellitus with foot ulcer: Secondary | ICD-10-CM | POA: Insufficient documentation

## 2022-05-04 DIAGNOSIS — E11610 Type 2 diabetes mellitus with diabetic neuropathic arthropathy: Secondary | ICD-10-CM | POA: Diagnosis not present

## 2022-05-04 DIAGNOSIS — M86372 Chronic multifocal osteomyelitis, left ankle and foot: Secondary | ICD-10-CM | POA: Insufficient documentation

## 2022-05-04 NOTE — Telephone Encounter (Signed)
Lm for patient.  

## 2022-05-04 NOTE — Procedures (Signed)
Patient Name: [name], [name] Date: 04/29/2022 Gender: Male D.O.B: [birth year] Age (years): 42 Referring Provider: Tammy Parrett Height (inches): 73 Interpreting Physician: Kara Mead MD, ABSM Weight (lbs): 235 RPSGT: Zadie Rhine BMI: 31 MRN: [id number] Neck Size: 16.00 <br> <br> CLINICAL INFORMATION Sleep Study Type: Split Night CPAP    Indication for sleep study: reassessment of  OSA    Epworth Sleepiness Score: 9  SLEEP STUDY TECHNIQUE As per the AASM Manual for the Scoring of Sleep and Associated Events v2.3 (April 2016) with a hypopnea requiring 4% desaturations.  The channels recorded and monitored were frontal, central and occipital EEG, electrooculogram (EOG), submentalis EMG (chin), nasal and oral airflow, thoracic and abdominal wall motion, anterior tibialis EMG, snore microphone, electrocardiogram, and pulse oximetry. Continuous positive airway pressure (CPAP) was initiated when the patient met split night criteria and was titrated according to treat sleep-disordered breathing.  MEDICATIONS Medications self-administered by patient taken the night of the study : N/A  RESPIRATORY PARAMETERS Diagnostic  Total AHI (/hr): 44.3 RDI (/hr): 55.9 OA Index (/hr): 11.2 CA Index (/hr): 0.0 REM AHI (/hr): N/A NREM AHI (/hr): 44.3 Supine AHI (/hr): 44.3 Non-supine AHI (/hr): 0 Min O2 Sat (%): 78.00 Mean O2 (%): 91.70 Time below 88% (min): 22.9   Titration  Optimal Pressure (cm): 16 AHI at Optimal Pressure (/hr): 0 Min O2 at Optimal Pressure (%): 95.0 Supine % at Optimal (%): 100 Sleep % at Optimal (%): 100   SLEEP ARCHITECTURE The recording time for the entire night was 425.3 minutes.  During a baseline period of 198.8 minutes, the patient slept for 139.6 minutes in REM and nonREM, yielding a sleep efficiency of 70.2%. Sleep onset after lights out was 58.7 minutes with a REM latency of N/A minutes. The patient spent 1.79% of the night in stage N1 sleep, 98.21% in  stage N2 sleep, 0.00% in stage N3 and 0% in REM.    During the titration period of 224.3 minutes, the patient slept for 212.5 minutes in REM and nonREM, yielding a sleep efficiency of 94.7%. Sleep onset after CPAP initiation was 3.2 minutes with a REM latency of 104.0 minutes. The patient spent 4.71% of the night in stage N1 sleep, 70.82% in stage N2 sleep, 2.35% in stage N3 and 22.1% in REM.  CARDIAC DATA The 2 lead EKG demonstrated sinus rhythm. The mean heart rate was 59.53 beats per minute. Other EKG findings include: None.   LEG MOVEMENT DATA The total Periodic Limb Movements of Sleep (PLMS) were 105. The PLMS index was 17.82 .  IMPRESSIONS - Severe obstructive sleep apnea occurred during the diagnostic portion of the study (AHI = 44.3/hour). An optimal PAP pressure was selected for this patient ( 16 cm of water) - No significant central sleep apnea occurred during the diagnostic portion of the study (CAI = 0.0/hour). - Moderate oxygen desaturation was noted during the diagnostic portion of the study (Min O2 =78.00%). - No snoring was audible during the diagnostic portion of the study. - No cardiac abnormalities were noted during this study. - Mild periodic limb movements of sleep occurred during the study.   DIAGNOSIS - Obstructive Sleep Apnea (G47.33)   RECOMMENDATIONS - Trial of CPAP therapy on 16 cm H2O with a Small-Medium size Fisher&Paykel Full Face Evora Full mask and heated humidification. - Avoid alcohol, sedatives and other CNS depressants that may worsen sleep apnea and disrupt normal sleep architecture. - Sleep hygiene should be reviewed to assess factors that may improve sleep quality. - Weight  management and regular exercise should be initiated or continued. - Return to Sleep Center for re-evaluation after 4 weeks of therapy    Kara Mead MD Board Certified in [place]

## 2022-05-04 NOTE — Telephone Encounter (Signed)
Split-night sleep study done on April 29, 2022 shows Severe obstructive sleep apnea occurred during the diagnostic portion of the study (AHI = 44.3/hour). An optimal PAP pressure was selected for this patient ( 16 cm of water) - No significant central sleep apnea occurred during the diagnostic portion of the study (CAI = 0.0/hour). - Moderate oxygen desaturation was noted during the diagnostic portion of the study (Min O2 =78.00%). - No snoring was audible during the diagnostic portion of the study. - No cardiac abnormalities were noted during this study. - Mild periodic limb movements of sleep occurred during the study.     DIAGNOSIS - Obstructive Sleep Apnea (G47.33)     RECOMMENDATIONS - Trial of CPAP therapy on 16 cm H2O with a Small-Medium size Fisher[employer]Paykel Full Face Evora Full mask and heated humidification. - Avoid alcohol, sedatives and other CNS depressants that may worsen sleep apnea and disrupt normal sleep architecture. - Sleep hygiene should be reviewed to assess factors that may improve sleep quality. - Weight management and regular exercise should be initiated or continued. - [facility] after 4 weeks of therapy     Please set up office visit to discuss sleep study results and go over treatment plan

## 2022-05-04 NOTE — Telephone Encounter (Signed)
-----   Message from Randall Hiss, MD sent at 05/04/2022  3:21 PM EDT ----- Given his MRI findings that do not suggest osteo, I would have him stop his oral abx now and keep followup appt with me in August ----- Message ----- From: Leory Plowman, Rad Results In Sent: 05/04/2022   2:02 PM EDT To: Randall Hiss, MD

## 2022-05-04 NOTE — Telephone Encounter (Signed)
Called patient regarding MRI results. Informed him he can stop oral antibiotics for now and follow up in August. Requested he call if he has any concerns regarding his foot.  Juanita Laster, RMA

## 2022-05-05 ENCOUNTER — Telehealth: Payer: Self-pay | Admitting: Internal Medicine

## 2022-05-05 ENCOUNTER — Telehealth: Payer: Self-pay | Admitting: Podiatry

## 2022-05-05 MED ORDER — ATORVASTATIN CALCIUM 80 MG PO TABS: 80.0000 mg | ORAL_TABLET | Freq: Every day | ORAL | 1 refills | Status: DC

## 2022-05-05 NOTE — Telephone Encounter (Signed)
Caller & Relationship to patient: Catha Nottingham [facility]  Call back number: [phone]  Date of last office visit: 01/18/22  Date of next office visit:   Medication(s) to be refilled:  atorvastatin (LIPITOR) 80 MG tablet  Preferred Pharmacy:  [id number] [place], [place] - [address] Phone:  [phone]  Fax:  [phone]

## 2022-05-05 NOTE — Telephone Encounter (Signed)
Patient is aware of results and voiced his understanding.  He agrees with cpap. Order has been placed.  Appt scheduled 08/12/2022 at 4:00. Nothing further needed.

## 2022-05-05 NOTE — Progress Notes (Signed)
[name], [name] ([id number]) Visit Report for 05/04/2022 Abuse Risk Screen Details Patient Name: Date of Service: [name], [name] 05/04/2022 9:45 A M Medical Record Number: [id number] Patient Account Number: [id number] Date of Birth/Sex: Treating RN: June 06, 1961 (61 y.o. [name] Primary Care Sargun Rummell: [name] Other Clinician: Referring Dorance Spink: Treating Jessica Seidman/Extender: [name] in Treatment: 0 Abuse Risk Screen Items Answer ABUSE RISK SCREEN: Has anyone close to you tried to hurt or harm you recentlyo No Do you feel uncomfortable with anyone in your familyo No Has anyone forced you do things that you didnt want to doo No Electronic Signature(s) Signed: 05/05/2022 4:20:13 PM By: Shawn Stall RN, BSN Entered By: Shawn Stall on 05/04/2022 10:05:39 -------------------------------------------------------------------------------- Activities of Daily Living Details Patient Name: Date of Service: [name], [name] 05/04/2022 9:45 A M Medical Record Number: [id number] Patient Account Number: [id number] Date of Birth/Sex: Treating RN: Mar 11, 1961 (60 y.o. [name] Primary Care Lavonia Eager: [name] Other Clinician: Referring Zaya Kessenich: Treating Imanie Darrow/Extender: Morley Kos Weeks in Treatment: 0 Activities of Daily Living Items Answer Activities of Daily Living (Please select one for each item) Drive Automobile Completely Able T Medications ake Completely Able Use T elephone Completely Able Care for Appearance Completely Able Use T oilet Completely Able Bath / Shower Completely Able Dress Self Completely Able Feed Self Completely Able Walk Need Assistance Get In / Out Bed Completely Able Housework Completely Able Prepare Meals Completely Able Handle Money Completely Able Shop for Self Completely Able Electronic Signature(s) Signed: 05/05/2022 4:20:13 PM By: Shawn Stall RN, BSN Entered By: Shawn Stall on  05/04/2022 10:05:55 -------------------------------------------------------------------------------- Education Screening Details Patient Name: Date of Service: [name] IG 05/04/2022 9:45 A M Medical Record Number: [id number] Patient Account Number: [id number] Date of Birth/Sex: Treating RN: 07-May-1961 (60 y.o. [name] Primary Care Hinata Diener: [name] Other Clinician: Referring Shalena Ezzell: Treating Bobbiejo Ishikawa/Extender: [name] in Treatment: 0 Primary Learner Assessed: Patient Learning Preferences/Education Level/Primary Language Learning Preference: Explanation, Demonstration, Printed Material Highest Education Level: College or Above Preferred Language: English Cognitive Barrier Language Barrier: No Translator Needed: No Memory Deficit: No Emotional Barrier: No Cultural/Religious Beliefs Affecting Medical Care: No Physical Barrier Impaired Vision: Yes Glasses Impaired Hearing: No Decreased Hand dexterity: No Knowledge/Comprehension Knowledge Level: High Comprehension Level: High Ability to understand written instructions: High Ability to understand verbal instructions: High Motivation Anxiety Level: Calm Cooperation: Cooperative Education Importance: Acknowledges Need Interest in Health Problems: Asks Questions Perception: Coherent Willingness to Engage in Self-Management High Activities: Readiness to Engage in Self-Management High Activities: Electronic Signature(s) Signed: 05/05/2022 4:20:13 PM By: Shawn Stall RN, BSN Entered By: Shawn Stall on 05/04/2022 10:06:14 -------------------------------------------------------------------------------- Fall Risk Assessment Details Patient Name: Date of Service: [name] IG 05/04/2022 9:45 A M Medical Record Number: [id number] Patient Account Number: [id number] Date of Birth/Sex: Treating RN: 1961-06-18 (60 y.o. [name] Primary Care Zachary Lovins: [name] Other  Clinician: Referring Wania Longstreth: Treating Joyceline Maiorino/Extender: [name] in Treatment: 0 Fall Risk Assessment Items Have you had 2 or more falls in the last 12 monthso 0 Yes Have you had any fall that resulted in injury in the last 12 monthso 0 No FALLS RISK SCREEN History of falling - immediate or within 3 months 0 No Secondary diagnosis (Do you have 2 or more medical diagnoseso) 0 No Ambulatory aid None/bed rest/wheelchair/nurse 0 No Crutches/cane/walker 15 Yes Furniture 0 No Intravenous therapy Access/Saline/Heparin Lock 0 No Gait/Transferring Normal/ bed rest/ wheelchair 0 Yes Weak (short steps  with or without shuffle, stooped but able to lift head while walking, may seek 0 No support from furniture) Impaired (short steps with shuffle, may have difficulty arising from chair, head down, impaired 0 No balance) Mental Status Oriented to own ability 0 Yes Electronic Signature(s) Signed: 05/05/2022 4:20:13 PM By: Shawn Stall RN, BSN Entered By: Shawn Stall on 05/04/2022 10:06:27 -------------------------------------------------------------------------------- Foot Assessment Details Patient Name: Date of Service: [name] IG 05/04/2022 9:45 A M Medical Record Number: [id number] Patient Account Number: [id number] Date of Birth/Sex: Treating RN: May 17, 1961 (60 y.o. [name] Primary Care Rethel Sebek: [name] Other Clinician: Referring Jaquese Irving: Treating Savilla Turbyfill/Extender: Morley Kos Weeks in Treatment: 0 Foot Assessment Items Site Locations + = Sensation present, - = Sensation absent, C = Callus, U = Ulcer R = Redness, W = Warmth, M = Maceration, PU = Pre-ulcerative lesion F = Fissure, S = Swelling, D = Dryness Assessment Right: Left: Other Deformity: No No Prior Foot Ulcer: No No Prior Amputation: No Yes Charcot Joint: No No Ambulatory Status: Ambulatory With Help Assistance Device: Walker GaitAdministrator, Civil Service) Signed: 05/05/2022 4:20:13 PM By: Shawn Stall RN, BSN Entered By: Shawn Stall on 05/04/2022 10:17:29 -------------------------------------------------------------------------------- Nutrition Risk Screening Details Patient Name: Date of Service: [name] IG 05/04/2022 9:45 A M Medical Record Number: [id number] Patient Account Number: [id number] Date of Birth/Sex: Treating RN: 1961-06-18 (60 y.o. [name] Primary Care Atlee Villers: [name] Other Clinician: Referring Savayah Waltrip: Treating Airam Heidecker/Extender: Morley Kos Weeks in Treatment: 0 Height (in): 73 Weight (lbs): 237 Body Mass Index (BMI): 31.3 Nutrition Risk Screening Items Score Screening NUTRITION RISK SCREEN: I have an illness or condition that made me change the kind and/or amount of food I eat 2 Yes I eat fewer than two meals per day 0 No I eat few fruits and vegetables, or milk products 0 No I have three or more drinks of beer, liquor or wine almost every day 0 No I have tooth or mouth problems that make it hard for me to eat 0 No I don't always have enough money to buy the food I need 0 No I eat alone most of the time 0 No I take three or more different prescribed or over-the-counter drugs a day 1 Yes Without wanting to, I have lost or gained 10 pounds in the last six months 0 No I am not always physically able to shop, cook and/or feed myself 0 No Nutrition Protocols Good Risk Protocol Provide education on elevated blood Moderate Risk Protocol 0 sugars and impact on wound healing, as applicable High Risk Proctocol Risk Level: Moderate Risk Score: 3 Electronic Signature(s) Signed: 05/05/2022 4:20:13 PM By: Shawn Stall RN, BSN Entered By: Shawn Stall on 05/04/2022 10:06:34

## 2022-05-05 NOTE — Telephone Encounter (Signed)
Patient called to check the status of the boot that you were going to order for him and to follow up on the Blastex ? Please advise.

## 2022-05-05 NOTE — Telephone Encounter (Signed)
Rx was sent 04/06/22.Marland Kitchen Resent to [facility]

## 2022-05-05 NOTE — Progress Notes (Addendum)
JATORIAN, RENAULT ([id number]) Visit Report for 05/04/2022 Allergy List Details Patient Name: Date of Service: KULLEN, TOMASETTI 05/04/2022 9:45 A M Medical Record Number: [id number] Patient Account Number: [id number] Date of Birth/Sex: Treating RN: 03/20/1961 (61 y.o. [name] Primary Care Daion Ginsberg: [name] Other Clinician: Referring Blayne Frankie: Treating Abbie Berling/Extender: Morley Kos Weeks in Treatment: 0 Allergies Active Allergies Claritin Reaction: anxiety Severity: Moderate Allergy Notes Electronic Signature(s) Signed: 05/05/2022 4:20:13 PM By: Shawn Stall RN, BSN Entered By: Shawn Stall on 05/03/2022 15:28:06 -------------------------------------------------------------------------------- Arrival Information Details Patient Name: Date of Service: [name] 05/04/2022 9:45 A M Medical Record Number: [id number] Patient Account Number: [id number] Date of Birth/Sex: Treating RN: June 27, 1961 (61 y.o. [name] Primary Care Mando Blatz: [name] Other Clinician: Referring Keric Zehren: Treating Chalise Pe/Extender: [name] in Treatment: 0 Visit Information Patient Arrived: [name] Arrival Time: 09:50 Accompanied By: self Transfer Assistance: None Patient Identification Verified: Yes Secondary Verification Process Completed: Yes Patient Requires Transmission-Based Precautions: No Patient Has Alerts: No History Since Last Visit Added or deleted any medications: No Any new allergies or adverse reactions: No Had a fall or experienced change in activities of daily living that may affect risk of falls: No Signs or symptoms of abuse/neglect since last visito No Hospitalized since last visit: No Implantable device outside of the clinic excluding cellular tissue based products placed in the center since last visit: No Has Dressing in Place as Prescribed: Yes Notes currently seeing podiatry for left foot. Electronic  Signature(s) Signed: 05/05/2022 4:20:13 PM By: Shawn Stall RN, BSN Entered By: Shawn Stall on 05/04/2022 10:02:35 -------------------------------------------------------------------------------- Clinic Level of Care Assessment Details Patient Name: Date of Service: GUY, SEESE IG 05/04/2022 9:45 A M Medical Record Number: [id number] Patient Account Number: [id number] Date of Birth/Sex: Treating RN: August 26, 1961 (61 y.o. [name] Primary Care Sandara Tyree: [name] Other Clinician: Referring Naod Sweetland: Treating Meliya Mcconahy/Extender: [name] in Treatment: 0 Clinic Level of Care Assessment Items TOOL 2 Quantity Score X- 1 0 Use when only an EandM is performed on the INITIAL visit ASSESSMENTS - Nursing Assessment / Reassessment X- 1 20 General Physical Exam (combine w/ comprehensive assessment (listed just below) when performed on new pt. evals) X- 1 25 Comprehensive Assessment (HX, ROS, Risk Assessments, Wounds Hx, etc.) ASSESSMENTS - Wound and Skin A ssessment / Reassessment []  - 0 Simple Wound Assessment / Reassessment - one wound X- 1 5 Complex Wound Assessment / Reassessment - multiple wounds X- 1 10 Dermatologic / Skin Assessment (not related to wound area) ASSESSMENTS - Ostomy and/or Continence Assessment and Care []  - 0 Incontinence Assessment and Management []  - 0 Ostomy Care Assessment and Management (repouching, etc.) PROCESS - Coordination of Care []  - 0 Simple Patient / Family Education for ongoing care X- 1 20 Complex (extensive) Patient / Family Education for ongoing care X- 1 10 Staff obtains , Records, T Results / Process Orders est X- 1 10 Staff telephones HHA, Nursing Homes / Clarify orders / etc []  - 0 Routine Transfer to another Facility (non-emergent condition) []  - 0 Routine Hospital Admission (non-emergent condition) []  - 0 New Admissions / / Ordering NPWT Apligraf, etc. , []  -  0 Emergency Hospital Admission (emergent condition) []  - 0 Simple Discharge Coordination X- 1 15 Complex (extensive) Discharge Coordination PROCESS - Special Needs []  - 0 Pediatric / Minor Patient Management []  - 0 Isolation Patient Management []  - 0 Hearing / Language / Visual special needs []  - 0  Assessment of Community assistance (transportation, D/C planning, etc.) []  - 0 Additional assistance / Altered mentation []  - 0 Support Surface(s) Assessment (bed, cushion, seat, etc.) INTERVENTIONS - Wound Cleansing / Measurement X- 1 5 Wound Imaging (photographs - any number of wounds) []  - 0 Wound Tracing (instead of photographs) []  - 0 Simple Wound Measurement - one wound X- 1 5 Complex Wound Measurement - multiple wounds []  - 0 Simple Wound Cleansing - one wound X- 1 5 Complex Wound Cleansing - multiple wounds INTERVENTIONS - Wound Dressings []  - 0 Small Wound Dressing one or multiple wounds X- 1 15 Medium Wound Dressing one or multiple wounds []  - 0 Large Wound Dressing one or multiple wounds []  - 0 Application of Medications - injection INTERVENTIONS - Miscellaneous []  - 0 External ear exam []  - 0 Specimen Collection (cultures, biopsies, blood, body fluids, etc.) []  - 0 Specimen(s) / Culture(s) sent or taken to Lab for analysis []  - 0 Patient Transfer (multiple staff / / Similar devices) []  - 0 Simple Staple / Suture removal (25 or less) []  - 0 Complex Staple / Suture removal (26 or more) []  - 0 Hypo / Hyperglycemic Management (close monitor of Blood Glucose) X- 1 15 Ankle / Brachial Index (ABI) - do not check if billed separately Has the patient been seen at the hospital within the last three years: Yes Total Score: 160 Level Of Care: New/Established - Level 5 Electronic Signature(s) Signed: 05/05/2022 4:20:13 PM By: RN, BSN Entered By: on 05/04/2022  10:56:47 -------------------------------------------------------------------------------- Encounter Discharge Information Details Patient Name: Date of Service: IG 05/04/2022 9:45 A M Medical Record Number: Patient Account Number: Date of Birth/Sex: Treating RN: Jun 13, 1961 (61 y.o. Primary Care Zamira Hickam: Other Clinician: Referring Ethelyne Erich: Treating Ariannah Arenson/Extender: Nurse, adult in Treatment: 0 Encounter Discharge Information Items Discharge Condition: Stable Ambulatory Status: Walker Discharge Destination: Home Transportation: Private Auto Accompanied By: self Schedule Follow-up Appointment: Yes Clinical Summary of Care: Electronic Signature(s) Signed: 05/05/2022 4:20:13 PM By: RN, BSN Entered By: on 05/04/2022 10:57:10 -------------------------------------------------------------------------------- Lower Extremity Assessment Details Patient Name: Date of Service: DARREK, LEASURE IG 05/04/2022 9:45 A M Medical Record Number: 05/06/2022 Patient Account Number: [name] Date of Birth/Sex: Treating RN: [birth year] (60 y.o. [id number] Primary Care Dshawn Mcnay: [id number] Other Clinician: Referring Tammela Bales: Treating Joffre Lucks/Extender: 09/19/1961 Weeks in Treatment: 0 Edema Assessment Assessed: [Left: Yes] [Right: No] Edema: [Left: Ye] [Right: s] Calf Left: Right: Point of Measurement: 37 cm From Medial Instep 38 cm Ankle Left: Right: Point of Measurement: 11 cm From Medial Instep 24 cm Knee To Floor Left: Right: From Medial Instep 49 cm Vascular Assessment Pulses: Dorsalis Pedis Palpable: [Left:Yes] Doppler Audible: [Left:Yes] Posterior Tibial Palpable: [Left:Yes] Doppler Audible: [Left:Yes] Blood Pressure: Brachial: [Left:116] Ankle: [Left:Dorsalis Pedis: 116 1.00] Electronic Signature(s) Signed: 05/05/2022 4:20:13 PM By:  [name] RN, BSN Entered By: [name] on 05/04/2022 10:15:22 -------------------------------------------------------------------------------- Multi Wound Chart Details Patient Name: Date of Service: Shawn Stall IG 05/04/2022 9:45 A M Medical Record Number: 05/06/2022 Patient Account Number: [name] Date of Birth/Sex: Treating RN: 04-29-1961 (60 y.o. [id number] Primary Care Braylynn Lewing: [id number] Other Clinician: Referring Julizza Sassone: Treating Naira Standiford/Extender: 09/19/1961 Weeks in Treatment: 0 Vital Signs Height(in): 73 Pulse(bpm): 106 Weight(lbs): 237 Blood Pressure(mmHg): 116/80 Body Mass Index(BMI): 31.3 Temperature(F): 98 Respiratory Rate(breaths/min): 20 Photos: [5:Left Foot] [N/A:N/A N/A] Wound Location: [5:Gradually Appeared] [N/A:N/A]  Wounding Event: [5:Diabetic Wound/Ulcer of the Lower] [N/A:N/A] Primary Etiology: [5:Extremity Anemia, Sleep Apnea, Congestive] [N/A:N/A] Comorbid History: [5:Heart Failure, Hypertension, Peripheral Venous Disease, Cirrhosis , Type II Diabetes, Osteoarthritis, Osteomyelitis, Neuropathy 10/19/2019] [N/A:N/A] Date Acquired: [5:0] [N/A:N/A] Weeks of Treatment: [5:Open] [N/A:N/A] Wound Status: [5:No] [N/A:N/A] Wound Recurrence: [5:2.3x2.3x0.1] [N/A:N/A] Measurements L x W x D (cm) [5:4.155] [N/A:N/A] A (cm) : rea [5:0.415] [N/A:N/A] Volume (cm) : [5:0.00%] [N/A:N/A] % Reduction in A rea: [5:0.00%] [N/A:N/A] % Reduction in Volume: [5:Grade 2] [N/A:N/A] Classification: [5:Medium] [N/A:N/A] Exudate A mount: [5:Serosanguineous] [N/A:N/A] Exudate Type: [5:red, brown] [N/A:N/A] Exudate Color: [5:Distinct, outline attached] [N/A:N/A] Wound Margin: [5:Large (67-100%)] [N/A:N/A] Granulation A mount: [5:Pink, Pale, Hyper-granulation] [N/A:N/A] Granulation Quality: [5:Small (1-33%)] [N/A:N/A] Necrotic A mount: [5:Fat Layer (Subcutaneous Tissue): Yes N/A] Exposed Structures: [5:Fascia: No Tendon: No Muscle: No  Joint: No Bone: No Small (1-33%)] [N/A:N/A] Epithelialization: [5:thin callous to periwound. Podiatry also N/A] Assessment Notes: [5:treating wound.] Treatment Notes Wound #5 (Foot) Wound Laterality: Left Cleanser Wound Cleanser Discharge Instruction: Cleanse the wound with wound cleanser prior to applying a clean dressing using gauze sponges, not tissue or cotton balls. Peri-Wound Care Topical Primary Dressing KerraCel Ag Gelling Fiber Dressing, 2x2 in (silver alginate) Discharge Instruction: Apply silver alginate to wound bed as instructed Secondary Dressing ABD Pad, 5x9 Discharge Instruction: Apply over primary dressing as directed. Secured With [employer], 4.5x3.1 (in/yd) Discharge Instruction: Secure with Kerlix as directed. Paper Tape, 2x10 (in/yd) Discharge Instruction: Secure dressing with tape as directed. Compression Wrap Compression Stockings Add-Ons Electronic Signature(s) Signed: 05/04/2022 12:02:15 PM By: [name] DO Signed: 05/05/2022 4:20:13 PM By: Shawn Stall RN, BSN Entered By: [name] on 05/04/2022 11:56:34 -------------------------------------------------------------------------------- Multi-Disciplinary Care Plan Details Patient Name: Date of Service: TRASEAN, DELIMA IG 05/04/2022 9:45 A M Medical Record Number: [id number] Patient Account Number: [id number] Date of Birth/Sex: Treating RN: 1960/10/25 (60 y.o. [name] Primary Care Greco Gastelum: [name] Other Clinician: Referring Elin Seats: Treating Darly Fails/Extender: Morley Kos Weeks in Treatment: 0 Active Inactive Electronic Signature(s) Signed: 05/07/2022 3:08:47 PM By: Shawn Stall RN, BSN Previous Signature: 05/05/2022 4:20:13 PM Version By: Shawn Stall RN, BSN Entered By: Shawn Stall on 05/07/2022 15:08:47 -------------------------------------------------------------------------------- Pain Assessment Details Patient Name: Date of  Service: [name] 05/04/2022 9:45 A M Medical Record Number: [id number] Patient Account Number: [id number] Date of Birth/Sex: Treating RN: [birth year] (60 y.o. [name] Primary Care Sewell Pitner: [name] Other Clinician: Referring Ausha Sieh: Treating Rykin Route/Extender: Morley Kos Weeks in Treatment: 0 Active Problems Location of Pain Severity and Description of Pain Patient Has Paino No Site Locations Rate the pain. Current Pain Level: 0 Pain Management and Medication Current Pain Management: Medication: No Cold Application: No Rest: No Massage: No Activity: No T.E.N.S.: No Heat Application: No Leg drop or elevation: No Is the Current Pain Management Adequate: Adequate How does your wound impact your activities of daily livingo Sleep: No Bathing: No Appetite: No Relationship With Others: No Bladder Continence: No Emotions: No Bowel Continence: No Work: No Toileting: No Drive: No Dressing: No Hobbies: No Psychologist, prison and probation services) Signed: 05/05/2022 4:20:13 PM By: Shawn Stall RN, BSN Entered By: Shawn Stall on 05/04/2022 10:06:45 -------------------------------------------------------------------------------- Patient/Caregiver Education Details Patient Name: Date of Service: Benjaman Kindler 7/18/2023andnbsp9:45 A M Medical Record Number: [id number] Patient Account Number: [id number] Date of Birth/Gender: Treating RN: Jun 25, 1961 (61 y.o. [name] Primary Care Physician: [name] Other Clinician: Referring Physician: Treating Physician/Extender: [name] in Treatment: 0 Education Assessment Education Provided To: Patient Education  Topics Provided [facility]: o Handouts: Welcome [facility] o Methods: Explain/Verbal Responses: State content correctly Electronic Signature(s) Signed: 05/05/2022 4:20:13 PM By: Shawn Stall RN, BSN Entered By: Shawn Stall  on 05/04/2022 10:43:37 -------------------------------------------------------------------------------- Wound Assessment Details Patient Name: Date of Service: SULLIVAN, JACUINDE IG 05/04/2022 9:45 A M Medical Record Number: [id number] Patient Account Number: [id number] Date of Birth/Sex: Treating RN: 17-Mar-1961 (60 y.o. [name] Primary Care Oluwatosin Bracy: [name] Other Clinician: Referring Emiliana Blaize: Treating Nattie Lazenby/Extender: Morley Kos Weeks in Treatment: 0 Wound Status Wound Number: 5 Primary Diabetic Wound/Ulcer of the Lower Extremity Etiology: Wound Location: Left Foot Wound Open Wounding Event: Gradually Appeared Status: Date Acquired: 10/19/2019 Comorbid Anemia, Sleep Apnea, Congestive Heart Failure, Hypertension, Weeks Of Treatment: 0 History: Peripheral Venous Disease, Cirrhosis , Type II Diabetes, Clustered Wound: No Osteoarthritis, Osteomyelitis, Neuropathy Wound under treatment by An Lannan outside of Wound Center Photos Wound Measurements Length: (cm) 2.3 Width: (cm) 2.3 Depth: (cm) 0.1 Area: (cm) 4.155 Volume: (cm) 0.415 % Reduction in Area: 0% % Reduction in Volume: 0% Epithelialization: Small (1-33%) Tunneling: No Undermining: No Wound Description Classification: Grade 2 Wound Margin: Distinct, outline attached Exudate Amount: Medium Exudate Type: Serosanguineous Exudate Color: red, brown Foul Odor After Cleansing: No Slough/Fibrino Yes Wound Bed Granulation Amount: Large (67-100%) Exposed Structure Granulation Quality: Pink, Pale, Hyper-granulation Fascia Exposed: No Necrotic Amount: Small (1-33%) Fat Layer (Subcutaneous Tissue) Exposed: Yes Necrotic Quality: Adherent Slough Tendon Exposed: No Muscle Exposed: No Joint Exposed: No Bone Exposed: No Assessment Notes thin callous to periwound. Podiatry also treating wound. Treatment Notes Wound #5 (Foot) Wound Laterality: Left Cleanser Wound Cleanser Discharge  Instruction: Cleanse the wound with wound cleanser prior to applying a clean dressing using gauze sponges, not tissue or cotton balls. Peri-Wound Care Topical Primary Dressing KerraCel Ag Gelling Fiber Dressing, 2x2 in (silver alginate) Discharge Instruction: Apply silver alginate to wound bed as instructed Secondary Dressing ABD Pad, 5x9 Discharge Instruction: Apply over primary dressing as directed. Secured With [employer], 4.5x3.1 (in/yd) Discharge Instruction: Secure with Kerlix as directed. Paper Tape, 2x10 (in/yd) Discharge Instruction: Secure dressing with tape as directed. Compression Wrap Compression Stockings Add-Ons Electronic Signature(s) Signed: 05/05/2022 4:20:13 PM By: Shawn Stall RN, BSN Entered By: Shawn Stall on 05/04/2022 10:55:48 -------------------------------------------------------------------------------- Vitals Details Patient Name: Date of Service: [name] 05/04/2022 9:45 A M Medical Record Number: [id number] Patient Account Number: [id number] Date of Birth/Sex: Treating RN: [birth year] (60 y.o. [name] Primary Care Tawonda Legaspi: [name] Other Clinician: Referring Idalee Foxworthy: Treating Adyan Palau/Extender: Morley Kos Weeks in Treatment: 0 Vital Signs Time Taken: 10:00 Temperature (F): 98 Height (in): 73 Pulse (bpm): 106 Source: Stated Respiratory Rate (breaths/min): 20 Weight (lbs): 237 Blood Pressure (mmHg): 116/80 Source: Stated Reference Range: 80 - 120 mg / dl Body Mass Index (BMI): 31.3 Electronic Signature(s) Signed: 05/05/2022 4:20:13 PM By: Shawn Stall RN, BSN Entered By: Shawn Stall on 05/04/2022 10:04:52

## 2022-05-05 NOTE — Progress Notes (Signed)
[name], [name] ([id number]) Visit Report for 05/04/2022 Chief Complaint Document Details Patient Name: Date of Service: [name], [name] 05/04/2022 9:45 A M Medical Record Number: [id number] Patient Account Number: [id number] Date of Birth/Sex: Treating RN: [birth year] (61 y.o. [name] Primary Care Provider: [name] Other Clinician: Referring Provider: Treating Provider/Extender: Morley Kos Weeks in Treatment: 0 Information Obtained from: Patient Chief Complaint Left foot ulcer Electronic Signature(s) Signed: 05/04/2022 12:02:15 PM By: [name] DO Entered By: [name] on 05/04/2022 11:56:40 -------------------------------------------------------------------------------- HPI Details Patient Name: Date of Service: [name] IG 05/04/2022 9:45 A M Medical Record Number: [id number] Patient Account Number: [id number] Date of Birth/Sex: Treating RN: 1961-06-29 (60 y.o. [name] Primary Care Provider: [name] Other Clinician: Referring Provider: Treating Provider/Extender: [name] in Treatment: 0 History of Present Illness HPI Description: 10/31/2019 upon evaluation today patient appears to be doing somewhat poorly in regard to his bilateral plantar feet. He has wounds that he tells me have been present since 2012 intermittently off and on. Most recently this has been open for at least the past 6 months to a year. He has been trying to treat this in different ways using Santyl along with various other dressings including Medihoney and even at one point Xeroform. Nothing really has seem to get this completely closed. He was recently in the hospital for cellulitis of his leg subsequently he did have x-rays as well as MRIs that showed negative for any signs of osteomyelitis in regard to the wounds on his feet. Fortunately there is no signs of systemic infection at this time. No fevers, chills, nausea, vomiting,  or diarrhea. Patient has previously used [employer] offloading shoes as far as frontal floaters as well as postop surgical shoes. He has never been in a total contact cast that may be something we need to strongly consider here. Patient's most recent hemoglobin A1c 1 month ago was 5.3 seems to be very well controlled which is great. Subsequently he has seen vascular as well as podiatry. His ABIs are 1.07 on the left and 1.14 on the right he seems to be doing well he does have chronic venous stasis. 11/07/2019 upon evaluation today patient appears to be doing well with regard to his wounds all things considered. I do not see any severe worsening he still has some callus buildup on the right more than the left he notes that he has been probably more active than he should as far as walking is concerned is just very hard to not be active. He knows he needs to be more careful in this regard however. He is willing to give the cast a try at this point although he notes that he is a little nervous about this just with regard to balance although he will be very careful and obviously if he has any trouble he knows to contact the office and let me know. 1/22; patient is in for his obligatory first total contact cast change. Our intake nurse reported a very large amount of drainage which is spelled out over to the surrounding skin. Has bilateral diabetic foot wounds. He has Charcot feet. We have been using silver alginate on his wounds. 11/14/2019 on evaluation today patient is actually seeming to make good improvement in regard to his bilateral plantar foot wounds. We have been using a cast on the left side and on the right side he has been using dressings he is changing up his own accord. With that being  said he tells me that he is also not walking as much just due to how unsteady he feels. He takes it easy when he does have to walk and when he does not have to walk he is resting. This is probably help in his right  foot as well has the left foot which is actually measuring better. In fact both are measuring better. Overall I am very pleased with how things seem to be progressing. The patient does have some odor on the left foot this does have me concerned about the possibility of infection, and actually probably go ahead and put him on antibiotics today as well as utilizing a continuation of the cast on the left foot I think that will be fine we probably just need to bring him in sooner to change this not last a whole week. 1/29; we brought the patient back today for a total contact cast change on the left out of concern for excessive drainage. We are using drawtex over the wound as the primary dressing 11/21/2019 on evaluation today patient appears to be doing well with regard to his left plantar foot. In fact both foot ulcers actually seem to be doing pretty well. Nonetheless he is having a lot of drainage on the left at this time and again we did obtain a wound culture did show positive for Staph aureus that was reviewed by myself today as well. Nonetheless he is on Bactrim which was shown to be sensitive that should be helping in this regard. Fortunately there is no signs of infection systemically at this point. 2/5; back in clinic today for a total contact cast change apparently secondary to very significant drainage. Still using drawtex 11/28/2019 upon evaluation today patient appears to be doing well with regard to his wounds. The right foot is doing okay as measured about the same in my opinion. The left foot is actually showing signs of significant improvement is measuring smaller there is a lot of hyper granulation likely due to the continued drainage at this point. We did obtain approval for a snap VAC I think that is good to be appropriate for him and will likely help this tremendously underneath the cast. He is definitely in agreement with proceeding with such. 2/12; patient came in today after his  snap VAC lost suction. Brought in to see one of our nurses. The dressing was replaced and then we put the cast back on and rehooked up the snap VAC. Apparently his wound looked very good per our intake nurse. 2/15; again we replaced the cast on Friday. By Saturday the snap VAC and light suction. He called this morning he comes in acutely. The wounds look fine however the VAC is not functioning. We replaced the cast using silver alginate as the primary dressing backed with Kerramax. The snap VAC was not replaced 12/05/2019 upon evaluation today patient appears to be doing better in regard to his left plantar foot ulcer. Fortunately there is no signs of active infection at this time. Unfortunately he is continuing to have issues with the right foot he is really not making any progress here things seem to be somewhat stagnant to be honest. The depth has increased but that is due to me having debrided the wound in the past based on what I am seeing. 12/12/2019 on evaluation today patient appears to be doing more poorly in regard to the left lower extremity. He has some erythema spreading up the side of his foot I am concerned  about infection again at this point. Unfortunately he has been seeing improvement with a total contact cast but I do not think we should put that on today. On his right plantar foot he continues to have significant drainage this is actually measuring deeper I really do not feel like you are making any progress whatsoever. I have prescribed Granix for him unfortunately his insurance apparently was going to cost him a $500 co-pay. 12/19/2019 upon evaluation today patient actually appears to be doing better in regard to both wounds. With that being said he actually did get the reGranix which he had to pay $500 for. With that being said it does look like that he is actually made some improvement based on what I am seeing at this point with the reGranix. Obviously if he is going to continue  this we are going to do something about trying to get him some help in covering the cost. 12/26/2019 on evaluation today patient appears to be doing really much better even compared to last week. Overall the wound seems to be much better even compared to last week and last week was better than the week before. Since has been using the reGranix his symptoms have improved significantly. With that being said the issue right now is simply that this is a very expensive medication for him the first dose cost him $500. Upon inspection patient's wound bed actually is however dramatically improved compared to before he started this 2 weeks ago. 01/02/2020 upon evaluation today patient appears to actually be doing well. He still had a little bit of reGranix left that has been using in small amounts he just been applying it every other day instead of every day in order to make it go longer. Overall we are still seeing excellent improvement he is measuring smaller looking better healthier tissue and everything seems to be pointing to this headed in the right direction. Fortunately there is no evidence of infection either which is also excellent news. He does have his MRI coming up within the next week. 01/09/2020 upon evaluation today patient appears to be doing a little worse today compared to previous week's evaluation. He is actually been out of the reGranix at this point. He has been trying to make the stretch out so he has been changing the dressings on a regular set schedule like he was previous. I think this has made a difference. Fortunately there is no signs of active infection at this time. No fevers, chills, nausea, vomiting, or diarrhea. 01/16/2020 upon evaluation today patient appears to be doing well with regard to his left plantar foot ulcer. The right plantar foot still shows some significant depth at this point. Fortunately there is no signs of active infection at this time. 01/30/2020 upon evaluation  today patient appears to be doing about the same in regard to his right plantar foot ulcer there is still some depth here and we had to wait till he actually switched over to his new insurance to get approval for the MRI under his new insurance plan. With that being said he now has switched as of April 1. Fortunately there is no signs of active infection at this time. Overall in regard to his left foot ulcer this seems to be doing much better and I am actually very pleased with how things are going. With that being said it is not quite as much progress as we were seeing with the reGranix but at the same time he has had trouble  getting this apparently there is been some hindrance here. I Georgina Peer try to actually send this to [facility] that was recommended by the drug rep to me. 02/13/20 upon evaluation today patient appears to be doing better in regard to his left foot ulcer this is great news. Unfortunately the right foot ulcer is not really significantly better at this time. There is no signs of active infection systemically though he did have his MRI which showed unfortunately he does have infection noted including an abscess in the foot. There is also marrow changes noted which are consistent with osteomyelitis based on the radiology review and interpretation. Unfortunately considering that the wound is not really making the progress that we will he would like to be seen I think that this is an indication that he may need some further referral both infectious disease as well as potentially to podiatrist to see if there is anything that can be done to help with the situation that were dealing with here. The left foot again is doing great. READMISSION 06/04/2021 This is a 61 year old man who was in the clinic in 2021 followed by Jeri Cos for areas on the right and left foot. He developed a left foot infection and was referred to ID. He left the clinic in a nonhealed state and was followed for a  period of [facility] Cannon Kettle. Apparently things really deteriorated in early July when he was admitted to hospital from 04/27/2021 through 05/06/2021 with sepsis secondary to a left foot infection. His blood cultures were negative. An MRI suggested fifth metatarsal osteomyelitis a left ankle septic joint. He was treated with vancomycin and ertapenem which he is still taking and may just about be finishing. He was seen by orthopedics and the patient adamantly refused to BKA. As far as I can tell he did not have the ankle aspirated I am not exactly sure what the issue was here. Since he has been discharged she is at [facility] for rehabilitation. He was last seen by Dr. Drucilla Schmidt on 05/20/2021 he noted osteo of the tibial talar bone cuboid and fifth metatarsal which is even more extensive than what was suggested by the MRI. He is apparently going for a consultation with orthopedic surgery in [place] sometime next week. I received a call about this man 2 weeks ago from Dr. Gwenlyn Found who follows him for the possibility of PAD. ABIs I think done in the office showed a ABI on the right of 1.05 at the PTA and 0.99 at the PTA on the left. He had a DVT rule out in the left leg that was negative for the DVT. Past medical history is extensive and includes diastolic heart failure, right first metatarsal head ulcer in 2021, excision of the right second ray by Dr. Cannon Kettle on 03/13/2020, hypertension, hypothyroidism. Left total hip replacement, right total knee replacement, carpal tunnel syndrome, obstructive sleep apnea alcohol abuse with cirrhosis although the patient denies current alcohol intake. The patient does not think he is a diabetic however looking through [facility] link I see 2 HgbAic's of this year that were greater than equal to 6.5 which by definition makes him diabetic. Nevertheless he is not on any treatment and does not check his blood sugars. The patient is now back home out  of the nursing home. Saw Dr. Drucilla Schmidt last week he was taken off vancomycin and ertapenem on August 22 and now is on doxycycline on Augmentin. He also saw Dr. Buford Dresser who  is his orthopedic surgeon in [place] he recommended a [employer]. He has been using Medihoney. 9/6I have been having trouble getting hyperbarics approved through our prior authorization process. Even though he had a limb threatening infection in the left foot and probably the left ankle there glitches in how some of the reports are worded also some of the consultants. In any case I am going to repeat his sedimentation rate and C-reactive protein. I am generally not in favor of doing things like this as they really do not alter the plan of care from my point of view however I am going to need to demonstrate that these remain high in order to get this through forhyperbaric treatment for chronic refractory osteomyelitis 9/13; following this man for a wound on his left plantar foot in the setting of type 2 diabetes and Charcot deformity. He has underlying chronic refractory osteomyelitis. Follow-up sedimentation rate and C-reactive protein were both elevated but the C-reactive protein was down to 1.4, sedimentation rate at 70. Sedimentation rate was only slightly down from previous at 85. His wound is measuring slightly smaller. 9/20; patient started hyperbaric oxygen therapy today and tolerated treatment well. This is for the underlying osteomyelitis. He remains on antibiotics but thinks he is getting close to finishing. The wound on the plantar aspect of his foot is the other issue we are following here. He is using Medihoney The patient has a Charcot foot in the setting of type 2 diabetes. He is going to need a total contact cast although his partner was away this week and we elected to delay this till next week 9/27; patient still tolerating hyperbaric oxygen well. Wound looked generally healthy not much depth under illumination still  100% covered in fibrinous debris. Raised callused edges around the wound he was prepared for a total contact cast. We have been using Medihoney 9/30; patient is back for his first obligatory total contact cast change. We are using Hydrofera Blue. Noted by our intake nurse to have a lot of drainage or at least a moderate amount of drainage. I am not sure I was previously aware of this 10/4; patient arrives today with a lot of drainage under the cast. When he had it changed last Friday there was also a similar amount of drainage. Her intake nurse says that they tried a wound VAC on him perhaps while I was on vacation in August under the cast but that did not work. In my experience that has not been unusual. We have been using Hydrofera Blue with all the secondary absorbers. The drainage today went right through all of our dressings. The patient is concerned about his foot being in a cast without much drainage. He is tolerating HBO well. There has been improvements in the wound in the mid part of his foot in the setting of a Charcot deformity 10/7; patient presents for cast change. He has no issues or complaints today. He denies signs of infection. 10/11; patient presents for cast change. At this time he would like to take a break from the cast. He would like to do daily dressing changes with Hydrofera Blue. He currently denies signs of infection. 10/14; his cast was taken off last week at his request. He arrives in the clinic with [facility]. He has been changing the dressing himself. He has way too much edema in the left foot and leg to consider a total contact cast. I do not really know the issue here. He does have chronic  venous insufficiency His wife stopped me in the clinic earlier in the week to report he is drinking again and she is concerned. I am uncertain whether there are other issues 10/18; he arrived in clinic last week having bilateral lower extremity edema likely secondary to chronic  venous insufficiency there was too much edema in the left leg to apply a total contact cast. I put him in compression on the left leg to control the swelling. This week he cut the wrap on the foot for reasons that are unclear however today he arrives in clinic with a smaller left leg but massive edema in the left foot. He is supposed to be wearing a juxta lite stocking on the right leg but he is not wearing the contact layer. His attendance at hyperbaric oxygen has been dwindling, he did not dive yesterday and he did not dive today concerned about hyperbarics causing swelling. I looked back in his record his last echo was in 2020 essentially normal left and right ventricular function. Last BUN and creatinine were done in July this was normal. He is having a lot of drainage in the left plantar foot wound 10/25; again he comes in today having missed HBO yesterday. Macerated skin around the wound which really does not look very good at all. A lot of edema in the left foot but an improvement in edema on the left leg. We wrapped him last week because of the amount of swelling in the left leg. We could not apply a total contact cast. The patient states that he wants to be able to change his dressing himself. I might consider this if he had stockings to control the swelling. He said he be here for HBO tomorrow. I will check the degree of erythema in his forefoot which I have marked. He is on doxycycline and Augmentin which was renewed by Dr. Drucilla Schmidt but I cannot see a follow-up note, follow-up inflammatory markers etc. I 1 point he said he was going to see his orthopedic surgeon in [place] I am not sure if he ever did this. I do not know why he has not followed up with infectious disease, he says he was not given an appointment but he is still on Augmentin doxycycline 11/1; he did not do the lab work I ordered last week. Still on Augmentin and doxycycline. Silver alginate and he is changing this daily using  his own juxta lite stocking. The surface of the wound does not look too bad. No real epithelialization however. The patient was seen today along with HBO 08/26/2021 upon evaluation today patient is being seen at his request by myself he wanted to transfer care to see me. With that being said he has been seeing Dr. Dellia Nims since he came back in August of this year. Unfortunately he tells me he still having quite a bit of drainage. He is also significant erythema and warmth noted of the foot as well. He has been hit or miss with regard to hyperbaric oxygen therapy tells me that at this point he took this week off because he was extremely claustrophobic and having a lot of issues he plans to start back next week. Nonetheless he has been given some medication by Dr. Dellia Nims as well to help calm things down which again may help him. Hopefully he can get back in hyperbarics as I think this is likely necessary. Nonetheless there does seem to be evidence of cellulitis noted today as well and in general I am  a little concerned in that regard. His wound unfortunately is significant on this left foot and is right where he takes the brunt of the force secondary to the Charcot arthropathy. I do not think a total contact cast is ideal due to the fact that he unfortunately is draining much too significantly he is also not happy with the idea of using a cast therefore he states he would not want to do that anyway. 09/16/2021 upon evaluation today patient's plantar foot ulcer unfortunately continues to show signs of issues here. He has been in the hospital due to trying to detox himself from alcohol he had withdrawal symptoms and subsequently was hospitalized. During that time it was recommended by both orthopedics as well as infectious disease apparently that he proceed with amputation. With that being said they discussed with him the risk of not doing so. This is well- documented in the encounter. With that being said  the patient does not want to proceed down that road and tells me that he declined their advice in that regard. Subsequently our goal then is to try to do what we can to try to get this thing healed and closed. I think this means he is getting need to stay off of it is much as possible he does have a wheelchair at home that he tells me he can use I think that that is going to be something that he does need to do. 09/23/2021 upon evaluation today patient continues to have significant issues here with his foot ulcer which is plantar on the left foot. Subsequently again he does have a history of Charcot foot he also has an issue of having had osteomyelitis as well as an open wound for some time here. Its been recommended multiple times for him to have an amputation although that is not something that he is really interested or wanting to do. 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 Subsequently this means that he also has a gait abnormality that is causing issues with his walking and causing abnormal pressures in the central part of his foot this is the reason he has a wound. Nonetheless I want to see what I can do about trying to get a boot to help with stabilization and offloading of working to see what we can do in that regard. 09/30/2021 upon evaluation today patient appears to be doing well with regard to his plantar foot ulcer. He still has significant granular hypergranulation but again this is much less than what it was even last time I saw him last week. Fortunately I think we are headed in the right direction. This is definitely something we could consider a skin graft or something along those lines if we can get it flattened out enough and get the drainage from being so significant. I feel like we are making some headway here. 10/07/2021 upon evaluation today patient appears to be doing decently well in regard to his wound. Fortunately there does  not appear to be any signs of infection overall I feel like it is actually showing some signs of improvement. He still has some hypergranulation but this is much less than its been. 10/21/2021 upon evaluation today patient appears to be doing well with regard to his wound is actually showing some signs of improvement which is great. We have been doing the chemical cauterization with silver nitrate as well as debridement as needed and again has been using silver alginate up  to this point. With that being said he does have some Hydrofera Blue at home he wonders if that can be beneficial at this point. 11/25/2021 upon evaluation today patient appears to be doing somewhat poorly in regard to his plantar foot ulcer. He is also been having some issues with his congestive heart failure. He has recently been in the hospital. I did review those notes as well and apparently his heart failure has been somewhat out of control. He is actually being managed as an outpatient in this regard but nonetheless this is something that can still be kept a close eye on according to the notes and what I see. Fortunately I do not see any signs of active infection at this time which is great news. Nonetheless I am concerned about the boggy central portion of the wound which I think is going to likely open up in the near future. Readmission: 01-27-2022 patient presents for follow-up here in the clinic today. His last been November 25, 2021 since have seen him. At that time we just ordered an MRI fortunately the MRI did not show any obvious signs of osteomyelitis and there were some reactive changes which were questionable and could not be excluded. With that being said in the interim since have seen him back on February 8 things have gotten worse from the standpoint of how the wound appears today. I do think that he is going to require potentially more debridement to clean this wound out than what I can even offer here in the clinic  there is a tremendous amount of hypergranulation tissue which is not sufficient to grow new tissue over and honestly I think this is going to lead to a delay in healing if its not taking care of. I need to see if I can get him into see a surgeon to get an opinion on whether or not they could take him to the OR for an aggressive surgical debridement to clear out this hypergranulation tissue and achieve hemostasis which is can be the biggest issue with me here in the clinic as he does tend to bleed even with very light superficial debridements. The patient's not opposed to this and he states that if I have someone that like for him to see he would be happy to go. Otherwise his medical history really has not changed. He does tell me he is getting ready to go for an evaluation for CPAP machine. Readmission 05/04/2022 Patient was last seen 3 months ago. He states he went to rehab for alcohol abuse. He continues to have a wound on the plantar aspect of the left foot. He states this has never completely healed over the past several years. He is following with podiatry for this issue currently and they are recommending blast X with collagen and a cam boot. He has not started this yet. He has home health. Currently he is using silver alginate. He also follows with Dr. Algis Liming infectious disease who ordered an MRI of the foot as he is concerned for osteomyelitis. CRP and sed rate are elevated. He is currently on Augmentin for prophylaxis as he has a history of osteomyelitis to this foot. Infectious disease has recommended amputation. Currently patient denies systemic signs of infection. Electronic Signature(s) Signed: 05/04/2022 12:02:15 PM By: [name] DO Entered By: [name] on 05/04/2022 11:58:58 -------------------------------------------------------------------------------- Physical Exam Details Patient Name: Date of Service: NANDAN, WILLEMS IG 05/04/2022 9:45 A M Medical Record Number:  [id number] Patient Account Number: [id number] Date of  Birth/Sex: Treating RN: 1961/04/22 (61 y.o. [name] Primary Care Provider: Billey Gosling Other Clinician: Referring Provider: Treating Provider/Extender: Lucile Crater Weeks in Treatment: 0 Constitutional respirations regular, non-labored and within target range for patient.. Cardiovascular 2+ dorsalis pedis/posterior tibialis pulses. Psychiatric pleasant and cooperative. Notes Left foot: T the plantar aspect there is an open wound with pale granulation tissue and undermining from 3-6 o'clock. No surrounding signs of soft tissue o infection. Electronic Signature(s) Signed: 05/04/2022 12:02:15 PM By: Kalman Shan DO Entered By: Kalman Shan on 05/04/2022 11:59:32 -------------------------------------------------------------------------------- Physician Orders Details Patient Name: Date of Service: [name] 05/04/2022 9:45 A M Medical Record Number: [id number] Patient Account Number: [id number] Date of Birth/Sex: Treating RN: [birth year] (61 y.o. [name] Primary Care Provider: Billey Gosling Other Clinician: Referring Provider: Treating Provider/Extender: Mickle Asper in Treatment: 0 Verbal / Phone Orders: No Diagnosis Coding ICD-10 Coding Code Description E11.621 Type 2 diabetes mellitus with foot ulcer M86.372 Chronic multifocal osteomyelitis, left ankle and foot M14.672 Charcot's joint, left ankle and foot Follow-up Appointments ppointment in 1 week. - Monday 1245 05/10/2022 Dr. Heber [place] and [place], Room 8. Return A Will call you when MRI results- base on MRI results if positive results for bone infection osteomyelitis will bring you in Monday to set up hyberbarics. Other: - Continue to follow with infectious disease and podiatry. Bathing/ Shower/ Hygiene May shower and wash wound with soap and water. - prior to dressing change Edema Control - Lymphedema /  SCD / Other Elevate legs to the level of the heart or above for 30 minutes daily and/or when sitting, a frequency of: - throughout the day Avoid standing for long periods of time. Exercise regularly Moisturize legs daily. Compression stocking or Garment 30-40 mm/Hg pressure to: - wear the Juxtalite HD to right and left leg. apply in the morning and remove at night. Off-Loading Open toe surgical shoe to: - left foot Other: - minimal weight bearing left foot use wheelchair for mobility. Additional Orders / Instructions Follow [place] wound care orders this week; continue Home Health for wound care. May utilize formulary equivalent dressing for wound treatment orders unless otherwise specified. - [facility] weekly. All other days patient to change. Wound Treatment Wound #5 - Foot Wound Laterality: Left Cleanser: Wound Cleanser 1 x Per Day/30 Days Discharge Instructions: Cleanse the wound with wound cleanser prior to applying a clean dressing using gauze sponges, not tissue or cotton balls. Prim Dressing: KerraCel Ag Gelling Fiber Dressing, 2x2 in (silver alginate) (Home Health) 1 x Per Day/30 Days ary Discharge Instructions: Apply silver alginate to wound bed as instructed Secondary Dressing: ABD Pad, 5x9 ([place]) 1 x Per Day/30 Days Discharge Instructions: Apply over primary dressing as directed. Secured With: [employer], 4.5x3.1 (in/yd) (Home Health) 1 x Per Day/30 Days Discharge Instructions: Secure with Kerlix as directed. Secured With: Paper Tape, 2x10 (in/yd) (Home Health) 1 x Per Day/30 Days Discharge Instructions: Secure dressing with tape as directed. Patient Medications llergies: Claritin A Notifications Medication Indication Start End lidocaine DOSE topical 4 % gel - gel topical applied only in clinic. Electronic Signature(s) Signed: 05/04/2022 12:02:15 PM By: Kalman Shan DO Entered By: Kalman Shan on 05/04/2022  11:59:40 Prescription 05/04/2022 -------------------------------------------------------------------------------- Montel Culver DO Patient Name: Provider: Jun 03, 1961 [id number] Date of Birth: NPI#Jerilynn Mages [id number] Sex: DEA #: [phone] Phone #: License #: [place] Patient Address: [id number], [place] [address] [place], [place] [address] [phone] Allergies Claritin Medication Medication: Route: Strength: Form: lidocaine 4 % topical gel topical 4% gel Class: TOPICAL LOCAL ANESTHETICS Dose: Frequency / Time: Indication: gel topical applied only in clinic. Number of Refills: Number of Units: 0 Generic Substitution: Start Date: End Date: One Time Use: Substitution Permitted No Note to Pharmacy: Hand Signature: Date(s): Electronic Signature(s) Signed: 05/04/2022 12:02:15 PM By: Kalman Shan DO Entered By: Kalman Shan on 05/04/2022 11:59:40 -------------------------------------------------------------------------------- Problem List Details Patient Name: Date of Service: [name] 05/04/2022 9:45 A M Medical Record Number: [id number] Patient Account Number: [id number] Date of Birth/Sex: Treating RN: 11/23/1960 (61 y.o. [name] Primary Care Provider: Billey Gosling Other Clinician: Referring Provider: Treating Provider/Extender: Lucile Crater Weeks in Treatment: 0 Active Problems ICD-10 Encounter Code Description Active Date MDM Diagnosis E11.621 Type 2 diabetes mellitus with foot ulcer 05/04/2022 No Yes L97.522 Non-pressure chronic ulcer of other part of left foot with fat layer exposed 05/04/2022 No Yes M86.372 Chronic multifocal osteomyelitis, left ankle and foot 05/04/2022 No Yes M14.672 Charcot's joint, left ankle and foot 05/04/2022 No Yes Inactive Problems Resolved Problems Electronic Signature(s) Signed: 05/04/2022  12:02:15 PM By: Kalman Shan DO Entered By: Kalman Shan on 05/04/2022 11:56:30 -------------------------------------------------------------------------------- Progress Note Details Patient Name: Date of Service: [name] 05/04/2022 9:45 A M Medical Record Number: [id number] Patient Account Number: [id number] Date of Birth/Sex: Treating RN: 13-Feb-1961 (61 y.o. [name] Primary Care Provider: Billey Gosling Other Clinician: Referring Provider: Treating Provider/Extender: Lucile Crater Weeks in Treatment: 0 Subjective Chief Complaint Information obtained from Patient Left foot ulcer History of Present Illness (HPI) 10/31/2019 upon evaluation today patient appears to be doing somewhat poorly in regard to his bilateral plantar feet. He has wounds that he tells me have been present since 2012 intermittently off and on. Most recently this has been open for at least the past 6 months to a year. He has been trying to treat this in different ways using Santyl along with various other dressings including Medihoney and even at one point Xeroform. Nothing really has seem to get this completely closed. He was recently in the hospital for cellulitis of his leg subsequently he did have x-rays as well as MRIs that showed negative for any signs of osteomyelitis in regard to the wounds on his feet. Fortunately there is no signs of systemic infection at this time. No fevers, chills, nausea, vomiting, or diarrhea. Patient has previously used Darco offloading shoes as far as frontal floaters as well as postop surgical shoes. He has never been in a total contact cast that may be something we need to strongly consider here. Patient's most recent hemoglobin A1c 1 month ago was 5.3 seems to be very well controlled which is great. Subsequently he has seen vascular as well as podiatry. His ABIs are 1.07 on the left and 1.14 on the right he seems to be doing well he does have chronic  venous stasis. 11/07/2019 upon evaluation today patient appears to be doing well with regard to his wounds all things considered. I do not see any severe worsening he still has some callus buildup on the right more than the left he notes that he has been probably more active than he should as far as walking is concerned is just very hard to not be active. He knows he needs to be more careful in this regard however. He is willing to give the  cast a try at this point although he notes that he is a little nervous about this just with regard to balance although he will be very careful and obviously if he has any trouble he knows to contact the office and let me know. 1/22; patient is in for his obligatory first total contact cast change. Our intake nurse reported a very large amount of drainage which is spelled out over to the surrounding skin. Has bilateral diabetic foot wounds. He has Charcot feet. We have been using silver alginate on his wounds. 11/14/2019 on evaluation today patient is actually seeming to make good improvement in regard to his bilateral plantar foot wounds. We have been using a cast on the left side and on the right side he has been using dressings he is changing up his own accord. With that being said he tells me that he is also not walking as much just due to how unsteady he feels. He takes it easy when he does have to walk and when he does not have to walk he is resting. This is probably help in his right foot as well has the left foot which is actually measuring better. In fact both are measuring better. Overall I am very pleased with how things seem to be progressing. The patient does have some odor on the left foot this does have me concerned about the possibility of infection, and actually probably go ahead and put him on antibiotics today as well as utilizing a continuation of the cast on the left foot I think that will be fine we probably just need to bring him in sooner to  change this not last a whole week. 1/29; we brought the patient back today for a total contact cast change on the left out of concern for excessive drainage. We are using drawtex over the wound as the primary dressing 11/21/2019 on evaluation today patient appears to be doing well with regard to his left plantar foot. In fact both foot ulcers actually seem to be doing pretty well. Nonetheless he is having a lot of drainage on the left at this time and again we did obtain a wound culture did show positive for Staph aureus that was reviewed by myself today as well. Nonetheless he is on Bactrim which was shown to be sensitive that should be helping in this regard. Fortunately there is no signs of infection systemically at this point. 2/5; back in clinic today for a total contact cast change apparently secondary to very significant drainage. Still using drawtex 11/28/2019 upon evaluation today patient appears to be doing well with regard to his wounds. The right foot is doing okay as measured about the same in my opinion. The left foot is actually showing signs of significant improvement is measuring smaller there is a lot of hyper granulation likely due to the continued drainage at this point. We did obtain approval for a snap VAC I think that is good to be appropriate for him and will likely help this tremendously underneath the cast. He is definitely in agreement with proceeding with such. 2/12; patient came in today after his snap VAC lost suction. Brought in to see one of our nurses. The dressing was replaced and then we put the cast back on and rehooked up the snap VAC. Apparently his wound looked very good per our intake nurse. 2/15; again we replaced the cast on Friday. By Saturday the snap VAC and light suction. He called this morning he comes in  acutely. The wounds look fine however the VAC is not functioning. We replaced the cast using silver alginate as the primary dressing backed with Kerramax.  The snap VAC was not replaced 12/05/2019 upon evaluation today patient appears to be doing better in regard to his left plantar foot ulcer. Fortunately there is no signs of active infection at this time. Unfortunately he is continuing to have issues with the right foot he is really not making any progress here things seem to be somewhat stagnant to be honest. The depth has increased but that is due to me having debrided the wound in the past based on what I am seeing. 12/12/2019 on evaluation today patient appears to be doing more poorly in regard to the left lower extremity. He has some erythema spreading up the side of his foot I am concerned about infection again at this point. Unfortunately he has been seeing improvement with a total contact cast but I do not think we should put that on today. On his right plantar foot he continues to have significant drainage this is actually measuring deeper I really do not feel like you are making any progress whatsoever. I have prescribed Granix for him unfortunately his insurance apparently was going to cost him a $500 co-pay. 12/19/2019 upon evaluation today patient actually appears to be doing better in regard to both wounds. With that being said he actually did get the reGranix which he had to pay $500 for. With that being said it does look like that he is actually made some improvement based on what I am seeing at this point with the reGranix. Obviously if he is going to continue this we are going to do something about trying to get him some help in covering the cost. 12/26/2019 on evaluation today patient appears to be doing really much better even compared to last week. Overall the wound seems to be much better even compared to last week and last week was better than the week before. Since has been using the reGranix his symptoms have improved significantly. With that being said the issue right now is simply that this is a very expensive medication for him the  first dose cost him $500. Upon inspection patient's wound bed actually is however dramatically improved compared to before he started this 2 weeks ago. 01/02/2020 upon evaluation today patient appears to actually be doing well. He still had a little bit of reGranix left that has been using in small amounts he just been applying it every other day instead of every day in order to make it go longer. Overall we are still seeing excellent improvement he is measuring smaller looking better healthier tissue and everything seems to be pointing to this headed in the right direction. Fortunately there is no evidence of infection either which is also excellent news. He does have his MRI coming up within the next week. 01/09/2020 upon evaluation today patient appears to be doing a little worse today compared to previous week's evaluation. He is actually been out of the reGranix at this point. He has been trying to make the stretch out so he has been changing the dressings on a regular set schedule like he was previous. I think this has made a difference. Fortunately there is no signs of active infection at this time. No fevers, chills, nausea, vomiting, or diarrhea. 01/16/2020 upon evaluation today patient appears to be doing well with regard to his left plantar foot ulcer. The right plantar foot still shows some  significant depth at this point. Fortunately there is no signs of active infection at this time. 01/30/2020 upon evaluation today patient appears to be doing about the same in regard to his right plantar foot ulcer there is still some depth here and we had to wait till he actually switched over to his new insurance to get approval for the MRI under his new insurance plan. With that being said he now has switched as of April 1. Fortunately there is no signs of active infection at this time. Overall in regard to his left foot ulcer this seems to be doing much better and I am actually very pleased with how  things are going. With that being said it is not quite as much progress as we were seeing with the reGranix but at the same time he has had trouble getting this apparently there is been some hindrance here. I Georgina Peer try to actually send this to [facility] that was recommended by the drug rep to me. 02/13/20 upon evaluation today patient appears to be doing better in regard to his left foot ulcer this is great news. Unfortunately the right foot ulcer is not really significantly better at this time. There is no signs of active infection systemically though he did have his MRI which showed unfortunately he does have infection noted including an abscess in the foot. There is also marrow changes noted which are consistent with osteomyelitis based on the radiology review and interpretation. Unfortunately considering that the wound is not really making the progress that we will he would like to be seen I think that this is an indication that he may need some further referral both infectious disease as well as potentially to podiatrist to see if there is anything that can be done to help with the situation that were dealing with here. The left foot again is doing great. READMISSION 06/04/2021 This is a 61 year old man who was in the clinic in 2021 followed by Jeri Cos for areas on the right and left foot. He developed a left foot infection and was referred to ID. He left the clinic in a nonhealed state and was followed for a period of [facility] Cannon Kettle. Apparently things really deteriorated in early July when he was admitted to hospital from 04/27/2021 through 05/06/2021 with sepsis secondary to a left foot infection. His blood cultures were negative. An MRI suggested fifth metatarsal osteomyelitis a left ankle septic joint. He was treated with vancomycin and ertapenem which he is still taking and may just about be finishing. He was seen by orthopedics and the patient adamantly  refused to BKA. As far as I can tell he did not have the ankle aspirated I am not exactly sure what the issue was here. Since he has been discharged she is at [facility] for rehabilitation. He was last seen by Dr. Drucilla Schmidt on 05/20/2021 he noted osteo of the tibial talar bone cuboid and fifth metatarsal which is even more extensive than what was suggested by the MRI. He is apparently going for a consultation with orthopedic surgery in [place] sometime next week. I received a call about this man 2 weeks ago from Dr. Gwenlyn Found who follows him for the possibility of PAD. ABIs I think done in the office showed a ABI on the right of 1.05 at the PTA and 0.99 at the PTA on the left. He had a DVT rule out in the left leg that was negative for the DVT.  Past medical history is extensive and includes diastolic heart failure, right first metatarsal head ulcer in 2021, excision of the right second ray by Dr. Cannon Kettle on 03/13/2020, hypertension, hypothyroidism. Left total hip replacement, right total knee replacement, carpal tunnel syndrome, obstructive sleep apnea alcohol abuse with cirrhosis although the patient denies current alcohol intake. The patient does not think he is a diabetic however looking through [facility] I see 2 HgbAic's of this year that were greater than equal to 6.5 which by definition makes him diabetic. Nevertheless he is not on any treatment and does not check his blood sugars. The patient is now back home out of the nursing home. Saw Dr. Drucilla Schmidt last week he was taken off vancomycin and ertapenem on August 22 and now is on doxycycline on Augmentin. He also saw Dr. Buford Dresser who is his orthopedic surgeon in [place] he recommended a [employer]. He has been using Medihoney. 9/6I have been having trouble getting hyperbarics approved through our prior authorization process. Even though he had a limb threatening infection in the left foot and probably the left ankle there glitches in how  some of the reports are worded also some of the consultants. In any case I am going to repeat his sedimentation rate and C-reactive protein. I am generally not in favor of doing things like this as they really do not alter the plan of care from my point of view however I am going to need to demonstrate that these remain high in order to get this through forhyperbaric treatment for chronic refractory osteomyelitis 9/13; following this man for a wound on his left plantar foot in the setting of type 2 diabetes and Charcot deformity. He has underlying chronic refractory osteomyelitis. Follow-up sedimentation rate and C-reactive protein were both elevated but the C-reactive protein was down to 1.4, sedimentation rate at 70. Sedimentation rate was only slightly down from previous at 85. His wound is measuring slightly smaller. 9/20; patient started hyperbaric oxygen therapy today and tolerated treatment well. This is for the underlying osteomyelitis. He remains on antibiotics but thinks he is getting close to finishing. The wound on the plantar aspect of his foot is the other issue we are following here. He is using Medihoney The patient has a Charcot foot in the setting of type 2 diabetes. He is going to need a total contact cast although his partner was away this week and we elected to delay this till next week 9/27; patient still tolerating hyperbaric oxygen well. Wound looked generally healthy not much depth under illumination still 100% covered in fibrinous debris. Raised callused edges around the wound he was prepared for a total contact cast. We have been using Medihoney 9/30; patient is back for his first obligatory total contact cast change. We are using Hydrofera Blue. Noted by our intake nurse to have a lot of drainage or at least a moderate amount of

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
## 2022-05-07 ENCOUNTER — Telehealth: Payer: Self-pay | Admitting: Pulmonary Disease

## 2022-05-07 NOTE — Telephone Encounter (Signed)
Please refer to sleep study result note.   Mychart visit scheduled 05/10/2022 at 2:00.  Patient is aware of appt date/time and voiced his understanding.  Nothing further needed.

## 2022-05-10 ENCOUNTER — Telehealth (INDEPENDENT_AMBULATORY_CARE_PROVIDER_SITE_OTHER): Payer: Medicare Other | Admitting: Adult Health

## 2022-05-10 ENCOUNTER — Encounter (HOSPITAL_BASED_OUTPATIENT_CLINIC_OR_DEPARTMENT_OTHER): Payer: Medicare Other | Admitting: Internal Medicine

## 2022-05-10 ENCOUNTER — Encounter: Payer: Self-pay | Admitting: Adult Health

## 2022-05-10 DIAGNOSIS — G4733 Obstructive sleep apnea (adult) (pediatric): Secondary | ICD-10-CM
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
## 2022-05-10 NOTE — Patient Instructions (Signed)
Begin CPAP at bedtime, wear all night long for at least 6hrs or more  Work on healthy sleep regimen  Do not drive if sleepy  Work on healthy weight loss.  Order for dream wear nasal mask  Follow up in 2 months and As needed

## 2022-05-13 ENCOUNTER — Ambulatory Visit: Payer: Medicare Other | Admitting: *Deleted

## 2022-05-13 DIAGNOSIS — I5032 Chronic diastolic (congestive) heart failure: Secondary | ICD-10-CM

## 2022-05-13 DIAGNOSIS — E11621 Type 2 diabetes mellitus with foot ulcer: Secondary | ICD-10-CM
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
## 2022-05-17 ENCOUNTER — Encounter: Payer: Self-pay | Admitting: Internal Medicine

## 2022-05-17 ENCOUNTER — Other Ambulatory Visit: Payer: Self-pay | Admitting: Internal Medicine

## 2022-05-18 ENCOUNTER — Telehealth: Payer: Self-pay | Admitting: Podiatry

## 2022-05-18 MED ORDER — VITAMIN B-12 100 MCG PO TABS: 100.0000 ug | ORAL_TABLET | Freq: Every day | ORAL | 0 refills | Status: DC

## 2022-05-18 MED ORDER — SERTRALINE HCL 50 MG PO TABS: 50.0000 mg | ORAL_TABLET | Freq: Every day | ORAL | 0 refills | Status: DC

## 2022-05-18 MED ORDER — VITAMIN B-1 100 MG PO TABS: 100.0000 mg | ORAL_TABLET | Freq: Every day | ORAL | 0 refills | Status: DC

## 2022-05-18 MED ORDER — METOPROLOL SUCCINATE ER 25 MG PO TB24: 25.0000 mg | ORAL_TABLET | Freq: Every day | ORAL | 0 refills | Status: DC

## 2022-05-18 MED ORDER — MAGNESIUM 400 MG PO TABS: 400.0000 mg | ORAL_TABLET | Freq: Every day | ORAL | 0 refills | Status: DC

## 2022-05-18 NOTE — Telephone Encounter (Signed)
Pt called and has received the boot you had ordered for him and you had mentioned using it with the blast x ointment but he does not have any of the ointment. He has an appt on 8.11 and is not sure if you wanted him to wear the boot and do the blast x before the appt or wait. If [facility] [address] if you need to send the medication.   Please Advise.Marland Kitchen

## 2022-05-18 NOTE — Telephone Encounter (Signed)
Notified pt of what Dr Allena Katz said. He said thank you he just wanted to check to make sure ok.

## 2022-05-20 NOTE — Progress Notes (Signed)
Reviewed and agree with assessment/plan.   Yoni Lobos, MD [facility] 05/20/2022, 9:56 AM Pager:  [phone]  

## 2022-05-24 ENCOUNTER — Encounter (HOSPITAL_BASED_OUTPATIENT_CLINIC_OR_DEPARTMENT_OTHER): Payer: Medicare Other | Attending: Internal Medicine | Admitting: Internal Medicine

## 2022-05-24 DIAGNOSIS — E11621 Type 2 diabetes mellitus with foot ulcer: Secondary | ICD-10-CM | POA: Diagnosis present

## 2022-05-24 DIAGNOSIS — Z96651 Presence of right artificial knee joint: Secondary | ICD-10-CM | POA: Diagnosis not present

## 2022-05-24 DIAGNOSIS — E039 Hypothyroidism, unspecified: Secondary | ICD-10-CM | POA: Diagnosis not present

## 2022-05-24 DIAGNOSIS — I110 Hypertensive heart disease with heart failure: Secondary | ICD-10-CM | POA: Insufficient documentation

## 2022-05-24 DIAGNOSIS — Z87891 Personal history of nicotine dependence: Secondary | ICD-10-CM | POA: Insufficient documentation

## 2022-05-24 DIAGNOSIS — G5602 Carpal tunnel syndrome, left upper limb: Secondary | ICD-10-CM | POA: Diagnosis not present

## 2022-05-24 DIAGNOSIS — G4733 Obstructive sleep apnea (adult) (pediatric): Secondary | ICD-10-CM | POA: Insufficient documentation

## 2022-05-24 DIAGNOSIS — Z96642 Presence of left artificial hip joint: Secondary | ICD-10-CM | POA: Insufficient documentation

## 2022-05-24 DIAGNOSIS — L97522 Non-pressure chronic ulcer of other part of left foot with fat layer exposed: Secondary | ICD-10-CM | POA: Insufficient documentation

## 2022-05-24 DIAGNOSIS — I5032 Chronic diastolic (congestive) heart failure: Secondary | ICD-10-CM | POA: Diagnosis not present

## 2022-05-24 DIAGNOSIS — M86372 Chronic multifocal osteomyelitis, left ankle and foot: Secondary | ICD-10-CM | POA: Diagnosis not present

## 2022-05-24 DIAGNOSIS — E11610 Type 2 diabetes mellitus with diabetic neuropathic arthropathy: Secondary | ICD-10-CM | POA: Insufficient documentation

## 2022-05-24 DIAGNOSIS — K7460 Unspecified cirrhosis of liver: Secondary | ICD-10-CM | POA: Insufficient documentation

## 2022-05-24 NOTE — Progress Notes (Addendum)
[name] ([id number]) Visit Report for 05/24/2022 Arrival Information Details Patient Name: Date of Service: [name], DUNSTAN 05/24/2022 3:30 PM Medical Record Number: [id number] Patient Account Number: [id number] Date of Birth/Sex: Treating RN: 04/13/1961 (61 y.o. Marcheta Grammes Primary Care Marcelene Weidemann: Billey Gosling Other Clinician: Referring Ramil Edgington: Treating Ravinder Hofland/Extender: Mickle Asper in Treatment: 2 Visit Information History Since Last Visit Added or deleted any medications: No Patient Arrived: [name] Any new allergies or adverse reactions: No Arrival Time: 15:38 Had a fall or experienced change in No Transfer Assistance: None activities of daily living that may affect Patient Identification Verified: Yes risk of falls: Secondary Verification Process Completed: Yes Signs or symptoms of abuse/neglect since No Patient Requires Transmission-Based Precautions: No last visito Patient Has Alerts: No Hospitalized since last visit: No Implantable device outside of the clinic No excluding cellular tissue based products placed in the center since last visit: Has Dressing in Place as Prescribed: Yes Has Footwear/Offloading in Place as Yes Prescribed: Left: Surgical Shoe with Pressure Relief Insole Pain Present Now: No Electronic Signature(s) Signed: 05/24/2022 4:32:44 PM By: Lorrin Jackson Entered By: Lorrin Jackson on 05/24/2022 15:41:18 -------------------------------------------------------------------------------- Encounter Discharge Information Details Patient Name: Date of Service: [name] 05/24/2022 3:30 PM Medical Record Number: [id number] Patient Account Number: [id number] Date of Birth/Sex: Treating RN: 1961/07/05 (61 y.o. Marcheta Grammes Primary Care Minta Fair: Billey Gosling Other Clinician: Referring Temisha Murley: Treating Ritika Hellickson/Extender: Mickle Asper in Treatment: 2 Encounter Discharge Information Items Post  Procedure Vitals Discharge Condition: Stable Temperature (F): 99.5 Ambulatory Status: Walker Pulse (bpm): 102 Discharge Destination: Home Respiratory Rate (breaths/min): 18 Transportation: Private Auto Blood Pressure (mmHg): 115/74 Schedule Follow-up Appointment: No Clinical Summary of Care: Provided on 05/24/2022 Form Type Recipient Paper Patient Patient Electronic Signature(s) Signed: 05/24/2022 4:32:44 PM By: Lorrin Jackson Entered By: Lorrin Jackson on 05/24/2022 16:17:41 -------------------------------------------------------------------------------- Lower Extremity Assessment Details Patient Name: Date of Service: [name], MCGEORGE [name] 05/24/2022 3:30 PM Medical Record Number: [id number] Patient Account Number: [id number] Date of Birth/Sex: Treating RN: 13-Nov-1960 (61 y.o. Marcheta Grammes Primary Care Elkin Belfield: Billey Gosling Other Clinician: Referring Rennae Ferraiolo: Treating Yousaf Sainato/Extender: Lucile Crater Weeks in Treatment: 2 Edema Assessment Assessed: [Left: Yes] [Right: No] Edema: [Left: Ye] [Right: s] Calf Left: Right: Point of Measurement: 37 cm From Medial Instep 36.2 cm Ankle Left: Right: Point of Measurement: 11 cm From Medial Instep 23.4 cm Vascular Assessment Pulses: Dorsalis Pedis Palpable: [Left:Yes] Electronic Signature(s) Signed: 05/24/2022 4:32:44 PM By: Lorrin Jackson Entered By: Lorrin Jackson on 05/24/2022 15:52:41 -------------------------------------------------------------------------------- Multi Wound Chart Details Patient Name: Date of Service: [name] 05/24/2022 3:30 PM Medical Record Number: [id number] Patient Account Number: [id number] Date of Birth/Sex: Treating RN: 08/13/1961 (61 y.o. Marcheta Grammes Primary Care Flornce Record: Billey Gosling Other Clinician: Referring Triana Coover: Treating Ladawna Walgren/Extender: Lucile Crater Weeks in Treatment: 2 Vital Signs Height(in): 73 Pulse(bpm): 102 Weight(lbs): 237 Blood  Pressure(mmHg): 115/74 Body Mass Index(BMI): 31.3 Temperature(F): 99.5 Respiratory Rate(breaths/min): 18 Photos: [5:Left, Plantar Foot] [N/A:N/A N/A] Wound Location: [5:Gradually Appeared] [N/A:N/A] Wounding Event: [5:Diabetic Wound/Ulcer of the Lower] [N/A:N/A] Primary Etiology: [5:Extremity Anemia, Sleep Apnea, Congestive] [N/A:N/A] Comorbid History: [5:Heart Failure, Hypertension, Peripheral Venous Disease, Cirrhosis , Type II Diabetes, Osteoarthritis, Osteomyelitis, Neuropathy 10/19/2019] [N/A:N/A] Date Acquired: [5:2] [N/A:N/A] Weeks of Treatment: [5:Open] [N/A:N/A] Wound Status: [5:No] [N/A:N/A] Wound Recurrence: [5:2.7x2.7x0.2] [N/A:N/A] Measurements L x W x D (cm) [5:5.726] [N/A:N/A] A (cm) : rea [5:1.145] [N/A:N/A] Volume (cm) : [5:-37.80%] [N/A:N/A] % Reduction in A [5:rea: -175.90%] [N/A:N/A] % Reduction in  Volume: [5:12] Starting Position 1 (o'clock): [5:4] Ending Position 1 (o'clock): [5:0.6] Maximum Distance 1 (cm): [5:Yes] [N/A:N/A] Undermining: [5:Grade 2] [N/A:N/A] Classification: [5:Medium] [N/A:N/A] Exudate A mount: [5:Serosanguineous] [N/A:N/A] Exudate Type: [5:red, brown] [N/A:N/A] Exudate Color: [5:Distinct, outline attached] [N/A:N/A] Wound Margin: [5:Large (67-100%)] [N/A:N/A] Granulation A mount: [5:Pink, Pale, Hyper-granulation] [N/A:N/A] Granulation Quality: [5:Small (1-33%)] [N/A:N/A] Necrotic A mount: [5:Fat Layer (Subcutaneous Tissue): Yes N/A] Exposed Structures: [5:Fascia: No Tendon: No Muscle: No Joint: No Bone: No Small (1-33%)] [N/A:N/A] Epithelialization: [5:Debridement - Excisional] [N/A:N/A] Debridement: Pre-procedure Verification/Time Out 16:02 [N/A:N/A] Taken: [5:Callus, Subcutaneous, Slough] [N/A:N/A] Tissue Debrided: [5:Skin/Subcutaneous Tissue] [N/A:N/A] Level: [5:7.29] [N/A:N/A] Debridement A (sq cm): [5:rea Curette] [N/A:N/A] Instrument: [5:Minimum] [N/A:N/A] Bleeding: [5:Pressure] [N/A:N/A] Hemostasis A chieved: [5:Procedure  was tolerated well] [N/A:N/A] Debridement Treatment Response: [5:2.7x2.7x0.2] [N/A:N/A] Post Debridement Measurements L x W x D (cm) [5:1.145] [N/A:N/A] Post Debridement Volume: (cm) [5:Calloused Periwound] [N/A:N/A] Assessment Notes: [5:Debridement] [N/A:N/A] Treatment Notes Electronic Signature(s) Signed: 05/24/2022 4:20:20 PM By: [name] DO Signed: 05/24/2022 4:32:44 PM By: [name] Entered By: [name] on 05/24/2022 16:13:00 -------------------------------------------------------------------------------- Multi-Disciplinary Care Plan Details Patient Name: Date of Service: [name] [name] 05/24/2022 3:30 PM Medical Record Number: [id number] Patient Account Number: [id number] Date of Birth/Sex: Treating RN: 1961/02/09 (60 y.o. [name] Primary Care Daphene Chisholm: [name] Other Clinician: Referring Denee Boeder: Treating Christyne Mccain/Extender: Morley Kos Weeks in Treatment: 2 Active Inactive Electronic Signature(s) Signed: 06/04/2022 4:40:00 PM By: Shawn Stall RN, BSN Signed: 06/07/2022 5:31:13 PM By: [name] Previous Signature: 05/24/2022 3:36:37 PM Version By: [name] Entered By: Shawn Stall on 06/04/2022 16:39:59 -------------------------------------------------------------------------------- Pain Assessment Details Patient Name: Date of Service: [name], [name] [name] 05/24/2022 3:30 PM Medical Record Number: [id number] Patient Account Number: [id number] Date of Birth/Sex: Treating RN: 22-Jan-1961 (60 y.o. [name] Primary Care Dorthie Santini: [name] Other Clinician: Referring Avry Roedl: Treating Yago Ludvigsen/Extender: Morley Kos Weeks in Treatment: 2 Active Problems Location of Pain Severity and Description of Pain Patient Has Paino No Site Locations Pain Management and Medication Current Pain Management: Electronic Signature(s) Signed: 05/24/2022 4:32:44 PM By: [name] Entered By: [name] on 05/24/2022 15:43:22 -------------------------------------------------------------------------------- Patient/Caregiver Education Details Patient Name: Date of Service: Benjaman Kindler 8/7/2023andnbsp3:30 PM Medical Record Number: [id number] Patient Account Number: [id number] Date of Birth/Gender: Treating RN: Apr 19, 1961 (60 y.o. [name] Primary Care Physician: [name] Other Clinician: Referring Physician: Treating Physician/Extender: [name] in Treatment: 2 Education Assessment Education Provided To: Patient Education Topics Provided Offloading: Methods: Explain/Verbal, Printed Responses: State content correctly Wound/Skin Impairment: Methods: Demonstration, Explain/Verbal, Printed Responses: State content correctly Electronic Signature(s) Signed: 05/24/2022 4:32:44 PM By: [name] Entered By: [name] on 05/24/2022 15:37:11 -------------------------------------------------------------------------------- Wound Assessment Details Patient Name: Date of Service: [name], [name] [name] 05/24/2022 3:30 PM Medical Record Number: [id number] Patient Account Number: [id number] Date of Birth/Sex: Treating RN: [birth year] (60 y.o. [name] Primary Care Ranyia Witting: [name] Other Clinician: Referring Lola Czerwonka: Treating Taija Mathias/Extender: Morley Kos Weeks in Treatment: 2 Wound Status Wound Number: 5 Primary Diabetic Wound/Ulcer of the Lower Extremity Etiology: Wound Location: Left, Plantar Foot Wound Open Wounding Event: Gradually Appeared Status: Date Acquired: 10/19/2019 Comorbid Anemia, Sleep Apnea, Congestive Heart Failure, Hypertension, Weeks Of Treatment: 2 History: Peripheral Venous Disease, Cirrhosis , Type II Diabetes, Clustered Wound: No Osteoarthritis, Osteomyelitis, Neuropathy Photos Wound Measurements Length: (cm) 2.7 Width: (cm) 2.7 Depth: (cm) 0.2 Area: (cm) 5.726 Volume: (cm)  1.145 % Reduction in Area: -37.8% % Reduction in Volume: -175.9% Epithelialization: Small (1-33%) Tunneling:  No Undermining: Yes Starting Position (o'clock): 12 Ending Position (o'clock): 4 Maximum Distance: (cm) 0.6 Wound Description Classification: Grade 2 Wound Margin: Distinct, outline attached Exudate Amount: Medium Exudate Type: Serosanguineous Exudate Color: red, brown Foul Odor After Cleansing: No Slough/Fibrino Yes Wound Bed Granulation Amount: Large (67-100%) Exposed Structure Granulation Quality: Pink, Pale, Hyper-granulation Fascia Exposed: No Necrotic Amount: Small (1-33%) Fat Layer (Subcutaneous Tissue) Exposed: Yes Necrotic Quality: Adherent Slough Tendon Exposed: No Muscle Exposed: No Joint Exposed: No Bone Exposed: No Assessment Notes Calloused Periwound Treatment Notes Wound #5 (Foot) Wound Laterality: Plantar, Left Cleanser Wound Cleanser Discharge Instruction: Cleanse the wound with wound cleanser prior to applying a clean dressing using gauze sponges, not tissue or cotton balls. Peri-Wound Care Topical Primary Dressing KerraCel Ag Gelling Fiber Dressing, 2x2 in (silver alginate) Discharge Instruction: Apply silver alginate to wound bed as instructed Secondary Dressing ABD Pad, 5x9 Discharge Instruction: Apply over primary dressing as directed. Secured With [employer], 4.5x3.1 (in/yd) Discharge Instruction: Secure with Kerlix as directed. Paper Tape, 2x10 (in/yd) Discharge Instruction: Secure dressing with tape as directed. Compression Wrap Compression Stockings Add-Ons Electronic Signature(s) Signed: 05/24/2022 4:32:44 PM By: [name] Entered By: [name] on 05/24/2022 15:51:46 -------------------------------------------------------------------------------- Vitals Details Patient Name: Date of Service: [name] [name] 05/24/2022 3:30 PM Medical Record Number: [id number] Patient Account Number: [id number] Date of  Birth/Sex: Treating RN: 01-01-1961 (60 y.o. [name] Primary Care Jasmia Angst: [name] Other Clinician: Referring Teresa Lemmerman: Treating Iman Reinertsen/Extender: Morley Kos Weeks in Treatment: 2 Vital Signs Time Taken: 15:42 Temperature (F): 99.5 Height (in): 73 Pulse (bpm): 102 Weight (lbs): 237 Respiratory Rate (breaths/min): 18 Body Mass Index (BMI): 31.3 Blood Pressure (mmHg): 115/74 Reference Range: 80 - 120 mg / dl Electronic Signature(s) Signed: 05/24/2022 4:32:44 PM By: [name] Entered By: [name] on 05/24/2022 15:43:09

## 2022-05-24 NOTE — Progress Notes (Signed)
[name], [name] ([id number]) Visit Report for 05/24/2022 Chief Complaint Document Details Patient Name: Date of Service: [name], [name] 05/24/2022 3:30 PM Medical Record Number: [id number] Patient Account Number: [id number] Date of Birth/Sex: Treating RN: 1961-09-07 (61 y.o. [name] Primary Care Provider: [name] Other Clinician: Referring Provider: Treating Provider/Extender: Morley Kos Weeks in Treatment: 2 Information Obtained from: Patient Chief Complaint Left foot ulcer Electronic Signature(s) Signed: 05/24/2022 4:20:20 PM By: [name] DO Entered By: [name] on 05/24/2022 16:13:09 -------------------------------------------------------------------------------- Debridement Details Patient Name: Date of Service: [name] 05/24/2022 3:30 PM Medical Record Number: [id number] Patient Account Number: [id number] Date of Birth/Sex: Treating RN: 1961-10-14 (61 y.o. [name] Primary Care Provider: [name] Other Clinician: Referring Provider: Treating Provider/Extender: Morley Kos Weeks in Treatment: 2 Debridement Performed for Assessment: Wound #5 Left,Plantar Foot Performed By: Physician [name], DO Debridement Type: Debridement Severity of Tissue Pre Debridement: Fat layer exposed Level of Consciousness (Pre-procedure): Awake and Alert Pre-procedure Verification/Time Out Yes - 16:02 Taken: Start Time: 16:03 T Area Debrided (L x W): otal 2.7 (cm) x 2.7 (cm) = 7.29 (cm) Tissue and other material debrided: Non-Viable, Callus, Slough, Subcutaneous, Slough Level: Skin/Subcutaneous Tissue Debridement Description: Excisional Instrument: Curette Bleeding: Minimum Hemostasis Achieved: Pressure End Time: 16:07 Response to Treatment: Procedure was tolerated well Level of Consciousness (Post- Awake and Alert procedure): Post Debridement Measurements of Total Wound Length: (cm) 2.7 Width: (cm)  2.7 Depth: (cm) 0.2 Volume: (cm) 1.145 Character of Wound/Ulcer Post Debridement: Stable Severity of Tissue Post Debridement: Fat layer exposed Post Procedure Diagnosis Same as Pre-procedure Electronic Signature(s) Signed: 05/24/2022 4:20:20 PM By: [name] DO Signed: 05/24/2022 4:32:44 PM By: [name] Entered By: [name] on 05/24/2022 16:07:25 -------------------------------------------------------------------------------- HPI Details Patient Name: Date of Service: [name] 05/24/2022 3:30 PM Medical Record Number: [id number] Patient Account Number: [id number] Date of Birth/Sex: Treating RN: 01-27-1961 (61 y.o. [name] Primary Care Provider: [name] Other Clinician: Referring Provider: Treating Provider/Extender: [name] in Treatment: 2 History of Present Illness HPI Description: 10/31/2019 upon evaluation today patient appears to be doing somewhat poorly in regard to his bilateral plantar feet. He has wounds that he tells me have been present since 2012 intermittently off and on. Most recently this has been open for at least the past 6 months to a year. He has been trying to treat this in different ways using Santyl along with various other dressings including Medihoney and even at one point Xeroform. Nothing really has seem to get this completely closed. He was recently in the hospital for cellulitis of his leg subsequently he did have x-rays as well as MRIs that showed negative for any signs of osteomyelitis in regard to the wounds on his feet. Fortunately there is no signs of systemic infection at this time. No fevers, chills, nausea, vomiting, or diarrhea. Patient has previously used Darco offloading shoes as far as frontal floaters as well as postop surgical shoes. He has never been in a total contact cast that may be something we need to strongly consider here. Patient's most recent hemoglobin A1c 1 month ago was 5.3  seems to be very well controlled which is great. Subsequently he has seen vascular as well as podiatry. His ABIs are 1.07 on the left and 1.14 on the right he seems to be doing well he does have chronic venous stasis. 11/07/2019 upon evaluation today patient appears to be doing well with regard to his wounds  all things considered. I do not see any severe worsening he still has some callus buildup on the right more than the left he notes that he has been probably more active than he should as far as walking is concerned is just very hard to not be active. He knows he needs to be more careful in this regard however. He is willing to give the cast a try at this point although he notes that he is a little nervous about this just with regard to balance although he will be very careful and obviously if he has any trouble he knows to contact the office and let me know. 1/22; patient is in for his obligatory first total contact cast change. Our intake nurse reported a very large amount of drainage which is spelled out over to the surrounding skin. Has bilateral diabetic foot wounds. He has Charcot feet. We have been using silver alginate on his wounds. 11/14/2019 on evaluation today patient is actually seeming to make good improvement in regard to his bilateral plantar foot wounds. We have been using a cast on the left side and on the right side he has been using dressings he is changing up his own accord. With that being said he tells me that he is also not walking as much just due to how unsteady he feels. He takes it easy when he does have to walk and when he does not have to walk he is resting. This is probably help in his right foot as well has the left foot which is actually measuring better. In fact both are measuring better. Overall I am very pleased with how things seem to be progressing. The patient does have some odor on the left foot this does have me concerned about the possibility of infection, and  actually probably go ahead and put him on antibiotics today as well as utilizing a continuation of the cast on the left foot I think that will be fine we probably just need to bring him in sooner to change this not last a whole week. 1/29; we brought the patient back today for a total contact cast change on the left out of concern for excessive drainage. We are using drawtex over the wound as the primary dressing 11/21/2019 on evaluation today patient appears to be doing well with regard to his left plantar foot. In fact both foot ulcers actually seem to be doing pretty well. Nonetheless he is having a lot of drainage on the left at this time and again we did obtain a wound culture did show positive for Staph aureus that was reviewed by myself today as well. Nonetheless he is on Bactrim which was shown to be sensitive that should be helping in this regard. Fortunately there is no signs of infection systemically at this point. 2/5; back in clinic today for a total contact cast change apparently secondary to very significant drainage. Still using drawtex 11/28/2019 upon evaluation today patient appears to be doing well with regard to his wounds. The right foot is doing okay as measured about the same in my opinion. The left foot is actually showing signs of significant improvement is measuring smaller there is a lot of hyper granulation likely due to the continued drainage at this point. We did obtain approval for a snap VAC I think that is good to be appropriate for him and will likely help this tremendously underneath the cast. He is definitely in agreement with proceeding with such. 2/12; patient came  in today after his snap VAC lost suction. Brought in to see one of our nurses. The dressing was replaced and then we put the cast back on and rehooked up the snap VAC. Apparently his wound looked very good per our intake nurse. 2/15; again we replaced the cast on Friday. By Saturday the snap VAC and light  suction. He called this morning he comes in acutely. The wounds look fine however the VAC is not functioning. We replaced the cast using silver alginate as the primary dressing backed with Kerramax. The snap VAC was not replaced 12/05/2019 upon evaluation today patient appears to be doing better in regard to his left plantar foot ulcer. Fortunately there is no signs of active infection at this time. Unfortunately he is continuing to have issues with the right foot he is really not making any progress here things seem to be somewhat stagnant to be honest. The depth has increased but that is due to me having debrided the wound in the past based on what I am seeing. 12/12/2019 on evaluation today patient appears to be doing more poorly in regard to the left lower extremity. He has some erythema spreading up the side of his foot I am concerned about infection again at this point. Unfortunately he has been seeing improvement with a total contact cast but I do not think we should put that on today. On his right plantar foot he continues to have significant drainage this is actually measuring deeper I really do not feel like you are making any progress whatsoever. I have prescribed Granix for him unfortunately his insurance apparently was going to cost him a $500 co-pay. 12/19/2019 upon evaluation today patient actually appears to be doing better in regard to both wounds. With that being said he actually did get the reGranix which he had to pay $500 for. With that being said it does look like that he is actually made some improvement based on what I am seeing at this point with the reGranix. Obviously if he is going to continue this we are going to do something about trying to get him some help in covering the cost. 12/26/2019 on evaluation today patient appears to be doing really much better even compared to last week. Overall the wound seems to be much better even compared to last week and last week was better  than the week before. Since has been using the reGranix his symptoms have improved significantly. With that being said the issue right now is simply that this is a very expensive medication for him the first dose cost him $500. Upon inspection patient's wound bed actually is however dramatically improved compared to before he started this 2 weeks ago. 01/02/2020 upon evaluation today patient appears to actually be doing well. He still had a little bit of reGranix left that has been using in small amounts he just been applying it every other day instead of every day in order to make it go longer. Overall we are still seeing excellent improvement he is measuring smaller looking better healthier tissue and everything seems to be pointing to this headed in the right direction. Fortunately there is no evidence of infection either which is also excellent news. He does have his MRI coming up within the next week. 01/09/2020 upon evaluation today patient appears to be doing a little worse today compared to previous week's evaluation. He is actually been out of the reGranix at this point. He has been trying to make the stretch  out so he has been changing the dressings on a regular set schedule like he was previous. I think this has made a difference. Fortunately there is no signs of active infection at this time. No fevers, chills, nausea, vomiting, or diarrhea. 01/16/2020 upon evaluation today patient appears to be doing well with regard to his left plantar foot ulcer. The right plantar foot still shows some significant depth at this point. Fortunately there is no signs of active infection at this time. 01/30/2020 upon evaluation today patient appears to be doing about the same in regard to his right plantar foot ulcer there is still some depth here and we had to wait till he actually switched over to his new insurance to get approval for the MRI under his new insurance plan. With that being said he now has  switched as of April 1. Fortunately there is no signs of active infection at this time. Overall in regard to his left foot ulcer this seems to be doing much better and I am actually very pleased with how things are going. With that being said it is not quite as much progress as we were seeing with the reGranix but at the same time he has had trouble getting this apparently there is been some hindrance here. I Ernie Hew try to actually send this to [facility] that was recommended by the drug rep to me. 02/13/20 upon evaluation today patient appears to be doing better in regard to his left foot ulcer this is great news. Unfortunately the right foot ulcer is not really significantly better at this time. There is no signs of active infection systemically though he did have his MRI which showed unfortunately he does have infection noted including an abscess in the foot. There is also marrow changes noted which are consistent with osteomyelitis based on the radiology review and interpretation. Unfortunately considering that the wound is not really making the progress that we will he would like to be seen I think that this is an indication that he may need some further referral both infectious disease as well as potentially to podiatrist to see if there is anything that can be done to help with the situation that were dealing with here. The left foot again is doing great. READMISSION 06/04/2021 This is a 61 year old man who was in the clinic in 2021 followed by Allen Derry for areas on the right and left foot. He developed a left foot infection and was referred to ID. He left the clinic in a nonhealed state and was followed for a period of [facility] Marylene Land. Apparently things really deteriorated in early July when he was admitted to hospital from 04/27/2021 through 05/06/2021 with sepsis secondary to a left foot infection. His blood cultures were negative. An MRI suggested fifth  metatarsal osteomyelitis a left ankle septic joint. He was treated with vancomycin and ertapenem which he is still taking and may just about be finishing. He was seen by orthopedics and the patient adamantly refused to BKA. As far as I can tell he did not have the ankle aspirated I am not exactly sure what the issue was here. Since he has been discharged she is at [facility] for rehabilitation. He was last seen by Dr. Algis Liming on 05/20/2021 he noted osteo of the tibial talar bone cuboid and fifth metatarsal which is even more extensive than what was suggested by the MRI. He is apparently going for a consultation with orthopedic surgery in  [place] sometime next week. I received a call about this man 2 weeks ago from Dr. Allyson Sabal who follows him for the possibility of PAD. ABIs I think done in the office showed a ABI on the right of 1.05 at the PTA and 0.99 at the PTA on the left. He had a DVT rule out in the left leg that was negative for the DVT. Past medical history is extensive and includes diastolic heart failure, right first metatarsal head ulcer in 2021, excision of the right second ray by Dr. Marylene Land on 03/13/2020, hypertension, hypothyroidism. Left total hip replacement, right total knee replacement, carpal tunnel syndrome, obstructive sleep apnea alcohol abuse with cirrhosis although the patient denies current alcohol intake. The patient does not think he is a diabetic however looking through [facility] link I see 2 HgbAic's of this year that were greater than equal to 6.5 which by definition makes him diabetic. Nevertheless he is not on any treatment and does not check his blood sugars. The patient is now back home out of the nursing home. Saw Dr. Algis Liming last week he was taken off vancomycin and ertapenem on August 22 and now is on doxycycline on Augmentin. He also saw Dr. Weston Anna who is his orthopedic surgeon in [place] he recommended a [employer]	[place]. He has been using Medihoney. 9/6I  have been having trouble getting hyperbarics approved through our prior authorization process. Even though he had a limb threatening infection in the left foot and probably the left ankle there glitches in how some of the reports are worded also some of the consultants. In any case I am going to repeat his sedimentation rate and C-reactive protein. I am generally not in favor of doing things like this as they really do not alter the plan of care from my point of view however I am going to need to demonstrate that these remain high in order to get this through forhyperbaric treatment for chronic refractory osteomyelitis 9/13; following this man for a wound on his left plantar foot in the setting of type 2 diabetes and Charcot deformity. He has underlying chronic refractory osteomyelitis. Follow-up sedimentation rate and C-reactive protein were both elevated but the C-reactive protein was down to 1.4, sedimentation rate at 70. Sedimentation rate was only slightly down from previous at 85. His wound is measuring slightly smaller. 9/20; patient started hyperbaric oxygen therapy today and tolerated treatment well. This is for the underlying osteomyelitis. He remains on antibiotics but thinks he is getting close to finishing. The wound on the plantar aspect of his foot is the other issue we are following here. He is using Medihoney The patient has a Charcot foot in the setting of type 2 diabetes. He is going to need a total contact cast although his partner was away this week and we elected to delay this till next week 9/27; patient still tolerating hyperbaric oxygen well. Wound looked generally healthy not much depth under illumination still 100% covered in fibrinous debris. Raised callused edges around the wound he was prepared for a total contact cast. We have been using Medihoney 9/30; patient is back for his first obligatory total contact cast change. We are using Hydrofera Blue. Noted by our intake  nurse to have a lot of drainage or at least a moderate amount of drainage. I am not sure I was previously aware of this 10/4; patient arrives today with a lot of drainage under the cast. When he had it changed last Friday there was also a  similar amount of drainage. Her intake nurse says that they tried a wound VAC on him perhaps while I was on vacation in August under the cast but that did not work. In my experience that has not been unusual. We have been using Hydrofera Blue with all the secondary absorbers. The drainage today went right through all of our dressings. The patient is concerned about his foot being in a cast without much drainage. He is tolerating HBO well. There has been improvements in the wound in the mid part of his foot in the setting of a Charcot deformity 10/7; patient presents for cast change. He has no issues or complaints today. He denies signs of infection. 10/11; patient presents for cast change. At this time he would like to take a break from the cast. He would like to do daily dressing changes with Hydrofera Blue. He currently denies signs of infection. 10/14; his cast was taken off last week at his request. He arrives in the clinic with [facility]. He has been changing the dressing himself. He has way too much edema in the left foot and leg to consider a total contact cast. I do not really know the issue here. He does have chronic venous insufficiency His wife stopped me in the clinic earlier in the week to report he is drinking again and she is concerned. I am uncertain whether there are other issues 10/18; he arrived in clinic last week having bilateral lower extremity edema likely secondary to chronic venous insufficiency there was too much edema in the left leg to apply a total contact cast. I put him in compression on the left leg to control the swelling. This week he cut the wrap on the foot for reasons that are unclear however today he arrives in clinic with a  smaller left leg but massive edema in the left foot. He is supposed to be wearing a juxta lite stocking on the right leg but he is not wearing the contact layer. His attendance at hyperbaric oxygen has been dwindling, he did not dive yesterday and he did not dive today concerned about hyperbarics causing swelling. I looked back in his record his last echo was in 2020 essentially normal left and right ventricular function. Last BUN and creatinine were done in July this was normal. He is having a lot of drainage in the left plantar foot wound 10/25; again he comes in today having missed HBO yesterday. Macerated skin around the wound which really does not look very good at all. A lot of edema in the left foot but an improvement in edema on the left leg. We wrapped him last week because of the amount of swelling in the left leg. We could not apply a total contact cast. The patient states that he wants to be able to change his dressing himself. I might consider this if he had stockings to control the swelling. He said he be here for HBO tomorrow. I will check the degree of erythema in his forefoot which I have marked. He is on doxycycline and Augmentin which was renewed by Dr. Algis Liming but I cannot see a follow-up note, follow-up inflammatory markers etc. I 1 point he said he was going to see his orthopedic surgeon in [place] I am not sure if he ever did this. I do not know why he has not followed up with infectious disease, he says he was not given an appointment but he is still on Augmentin doxycycline 11/1; he did  not do the lab work I ordered last week. Still on Augmentin and doxycycline. Silver alginate and he is changing this daily using his own juxta lite stocking. The surface of the wound does not look too bad. No real epithelialization however. The patient was seen today along with HBO 08/26/2021 upon evaluation today patient is being seen at his request by myself he wanted to transfer care to see  me. With that being said he has been seeing Dr. Leanord Hawking since he came back in August of this year. Unfortunately he tells me he still having quite a bit of drainage. He is also significant erythema and warmth noted of the foot as well. He has been hit or miss with regard to hyperbaric oxygen therapy tells me that at this point he took this week off because he was extremely claustrophobic and having a lot of issues he plans to start back next week. Nonetheless he has been given some medication by Dr. Leanord Hawking as well to help calm things down which again may help him. Hopefully he can get back in hyperbarics as I think this is likely necessary. Nonetheless there does seem to be evidence of cellulitis noted today as well and in general I am a little concerned in that regard. His wound unfortunately is significant on this left foot and is right where he takes the brunt of the force secondary to the Charcot arthropathy. I do not think a total contact cast is ideal due to the fact that he unfortunately is draining much too significantly he is also not happy with the idea of using a cast therefore he states he would not want to do that anyway. 09/16/2021 upon evaluation today patient's plantar foot ulcer unfortunately continues to show signs of issues here. He has been in the hospital due to trying to detox himself from alcohol he had withdrawal symptoms and subsequently was hospitalized. During that time it was recommended by both orthopedics as well as infectious disease apparently that he proceed with amputation. With that being said they discussed with him the risk of not doing so. This is well- documented in the encounter. With that being said the patient does not want to proceed down that road and tells me that he declined their advice in that regard. Subsequently our goal then is to try to do what we can to try to get this thing healed and closed. I think this means he is getting need to stay off of it is  much as possible he does have a wheelchair at home that he tells me he can use I think that that is going to be something that he does need to do. 09/23/2021 upon evaluation today patient continues to have significant issues here with his foot ulcer which is plantar on the left foot. Subsequently again he does have a history of Charcot foot he also has an issue of having had osteomyelitis as well as an open wound for some time here. Its been recommended multiple times for him to have an amputation although that is not something that he is really interested or wanting to do. 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 Subsequently this means that he also has a gait abnormality that is causing issues with his walking and causing abnormal pressures in the central part of his foot this is the reason he has a wound. Nonetheless I want to see what I can do about trying to get a  boot to help with stabilization and offloading of working to see what we can do in that regard. 09/30/2021 upon evaluation today patient appears to be doing well with regard to his plantar foot ulcer. He still has significant granular hypergranulation but again this is much less than what it was even last time I saw him last week. Fortunately I think we are headed in the right direction. This is definitely something we could consider a skin graft or something along those lines if we can get it flattened out enough and get the drainage from being so significant. I feel like we are making some headway here. 10/07/2021 upon evaluation today patient appears to be doing decently well in regard to his wound. Fortunately there does not appear to be any signs of infection overall I feel like it is actually showing some signs of improvement. He still has some hypergranulation but this is much less than its been. 10/21/2021 upon evaluation today patient appears to be doing well with regard to his  wound is actually showing some signs of improvement which is great. We have been doing the chemical cauterization with silver nitrate as well as debridement as needed and again has been using silver alginate up to this point. With that being said he does have some Hydrofera Blue at home he wonders if that can be beneficial at this point. 11/25/2021 upon evaluation today patient appears to be doing somewhat poorly in regard to his plantar foot ulcer. He is also been having some issues with his congestive heart failure. He has recently been in the hospital. I did review those notes as well and apparently his heart failure has been somewhat out of control. He is actually being managed as an outpatient in this regard but nonetheless this is something that can still be kept a close eye on according to the notes and what I see. Fortunately I do not see any signs of active infection at this time which is great news. Nonetheless I am concerned about the boggy central portion of the wound which I think is going to likely open up in the near future. Readmission: 01-27-2022 patient presents for follow-up here in the clinic today. His last been November 25, 2021 since have seen him. At that time we just ordered an MRI fortunately the MRI did not show any obvious signs of osteomyelitis and there were some reactive changes which were questionable and could not be excluded. With that being said in the interim since have seen him back on February 8 things have gotten worse from the standpoint of how the wound appears today. I do think that he is going to require potentially more debridement to clean this wound out than what I can even offer here in the clinic there is a tremendous amount of hypergranulation tissue which is not sufficient to grow new tissue over and honestly I think this is going to lead to a delay in healing if its not taking care of. I need to see if I can get him into see a surgeon to get an opinion on  whether or not they could take him to the OR for an aggressive surgical debridement to clear out this hypergranulation tissue and achieve hemostasis which is can be the biggest issue with me here in the clinic as he does tend to bleed even with very light superficial debridements. The patient's not opposed to this and he states that if I have someone that like for him to see  he would be happy to go. Otherwise his medical history really has not changed. He does tell me he is getting ready to go for an evaluation for CPAP machine. Readmission 05/04/2022 Patient was last seen 3 months ago. He states he went to rehab for alcohol abuse. He continues to have a wound on the plantar aspect of the left foot. He states this has never completely healed over the past several years. He is following with podiatry for this issue currently and they are recommending blast X with collagen and a cam boot. He has not started this yet. He has home health. Currently he is using silver alginate. He also follows with Dr. Algis Liming infectious disease who ordered an MRI of the foot as he is concerned for osteomyelitis. CRP and sed rate are elevated. He is currently on Augmentin for prophylaxis as he has a history of osteomyelitis to this foot. Infectious disease has recommended amputation. Currently patient denies systemic signs of infection. 8/7; patient is following up with podiatry on 8/11. He wanted to come in to be debrided. He has no issues or complaints today. He has been using silver alginate to the wound bed. He has home health that helps change the dressings. He had an MRI completed on 7/18 that did not show evidence of osteomyelitis. He is scheduled to see infectious disease on 8/16. Electronic Signature(s) Signed: 05/24/2022 4:20:20 PM By: [name] DO Entered By: [name] on 05/24/2022 16:14:17 -------------------------------------------------------------------------------- Physical Exam  Details Patient Name: Date of Service: [name], [name] [name] 05/24/2022 3:30 PM Medical Record Number: [id number] Patient Account Number: [id number] Date of Birth/Sex: Treating RN: [birth year] (60 y.o. [name] Primary Care Provider: [name] Other Clinician: Referring Provider: Treating Provider/Extender: Morley Kos Weeks in Treatment: 2 Constitutional respirations regular, non-labored and within target range for patient.. Cardiovascular 2+ dorsalis pedis/posterior tibialis pulses. Psychiatric pleasant and cooperative. Notes Left foot: T the plantar aspect there is an open wound with pale granulation tissue with a thin layer of film and callus. No surrounding signs of infection. o Electronic Signature(s) Signed: 05/24/2022 4:20:20 PM By: [name] DO Entered By: [name] on 05/24/2022 16:16:51 -------------------------------------------------------------------------------- Physician Orders Details Patient Name: Date of Service: [name], [name] [name] 05/24/2022 3:30 PM Medical Record Number: [id number] Patient Account Number: [id number] Date of Birth/Sex: Treating RN: 01/14/1961 (60 y.o. [name] Primary Care Provider: [name] Other Clinician: Referring Provider: Treating Provider/Extender: [name] in Treatment: 2 Verbal / Phone Orders: No Diagnosis Coding Follow-up Appointments Other: - Continue to follow with Infectious Disease and Podiatry, call for appointment if Podiatry treatment not effective. Bathing/ Shower/ Hygiene May shower and wash wound with soap and water. - prior to dressing change Edema Control - Lymphedema / SCD / Other Elevate legs to the level of the heart or above for 30 minutes daily and/or when sitting, a frequency of: - throughout the day Avoid standing for long periods of time. Exercise regularly Moisturize legs daily. Compression stocking or Garment 30-40 mm/Hg pressure to: - wear  the Juxtalite HD to right and left leg. apply in the morning and remove at night. Off-Loading Other: - Defender Boot: minimal weight bearing left foot use wheelchair for mobility. Additional Orders / Instructions Follow Nutritious Diet Home Health No change in wound care orders this week; continue Home Health for wound care. May utilize formulary equivalent dressing for wound treatment orders unless otherwise specified. Other Home Health Orders/Instructions: - [facility] Wound Treatment Wound #5 -  Foot Wound Laterality: Plantar, Left Cleanser: Wound Cleanser 1 x Per Day/30 Days Discharge Instructions: Cleanse the wound with wound cleanser prior to applying a clean dressing using gauze sponges, not tissue or cotton balls. Prim Dressing: KerraCel Ag Gelling Fiber Dressing, 2x2 in (silver alginate) (Home Health) 1 x Per Day/30 Days ary Discharge Instructions: Apply silver alginate to wound bed as instructed Secondary Dressing: ABD Pad, 5x9 (Home Health) 1 x Per Day/30 Days Discharge Instructions: Apply over primary dressing as directed. Secured With: [employer], 4.5x3.1 (in/yd) (Home Health) 1 x Per Day/30 Days Discharge Instructions: Secure with Kerlix as directed. Secured With: Paper Tape, 2x10 (in/yd) (Home Health) 1 x Per Day/30 Days Discharge Instructions: Secure dressing with tape as directed. Electronic Signature(s) Signed: 05/24/2022 4:20:20 PM By: [name] DO Entered By: [name] on 05/24/2022 16:17:01 -------------------------------------------------------------------------------- Problem List Details Patient Name: Date of Service: [name], [name] [name] 05/24/2022 3:30 PM Medical Record Number: [id number] Patient Account Number: [id number] Date of Birth/Sex: Treating RN: 1961-06-16 (60 y.o. [name] Primary Care Provider: [name] Other Clinician: Referring Provider: Treating Provider/Extender: Morley Kos Weeks in  Treatment: 2 Active Problems ICD-10 Encounter Code Description Active Date MDM Diagnosis E11.621 Type 2 diabetes mellitus with foot ulcer 05/04/2022 No Yes L97.522 Non-pressure chronic ulcer of other part of left foot with fat layer exposed 05/04/2022 No Yes M86.372 Chronic multifocal osteomyelitis, left ankle and foot 05/04/2022 No Yes M14.672 Charcot's joint, left ankle and foot 05/04/2022 No Yes Inactive Problems Resolved Problems Electronic Signature(s) Signed: 05/24/2022 4:20:20 PM By: [name] DO Entered By: [name] on 05/24/2022 16:12:54 -------------------------------------------------------------------------------- Progress Note Details Patient Name: Date of Service: [name] 05/24/2022 3:30 PM Medical Record Number: [id number] Patient Account Number: [id number] Date of Birth/Sex: Treating RN: [birth year] (60 y.o. [name] Primary Care Provider: [name] Other Clinician: Referring Provider: Treating Provider/Extender: Morley Kos Weeks in Treatment: 2 Subjective Chief Complaint Information obtained from Patient Left foot ulcer History of Present Illness (HPI) 10/31/2019 upon evaluation today patient appears to be doing somewhat poorly in regard to his bilateral plantar feet. He has wounds that he tells me have been present since 2012 intermittently off and on. Most recently this has been open for at least the past 6 months to a year. He has been trying to treat this in different ways using Santyl along with various other dressings including Medihoney and even at one point Xeroform. Nothing really has seem to get this completely closed. He was recently in the hospital for cellulitis of his leg subsequently he did have x-rays as well as MRIs that showed negative for any signs of osteomyelitis in regard to the wounds on his feet. Fortunately there is no signs of systemic infection at this time. No fevers, chills, nausea, vomiting,  or diarrhea. Patient has previously used Darco offloading shoes as far as frontal floaters as well as postop surgical shoes. He has never been in a total contact cast that may be something we need to strongly consider here. Patient's most recent hemoglobin A1c 1 month ago was 5.3 seems to be very well controlled which is great. Subsequently he has seen vascular as well as podiatry. His ABIs are 1.07 on the left and 1.14 on the right he seems to be doing well he does have chronic venous stasis. 11/07/2019 upon evaluation today patient appears to be doing well with regard to his wounds all things considered. I do not see any severe worsening he still  has some callus buildup on the right more than the left he notes that he has been probably more active than he should as far as walking is concerned is just very hard to not be active. He knows he needs to be more careful in this regard however. He is willing to give the cast a try at this point although he notes that he is a little nervous about this just with regard to balance although he will be very careful and obviously if he has any trouble he knows to contact the office and let me know. 1/22; patient is in for his obligatory first total contact cast change. Our intake nurse reported a very large amount of drainage which is spelled out over to the surrounding skin. Has bilateral diabetic foot wounds. He has Charcot feet. We have been using silver alginate on his wounds. 11/14/2019 on evaluation today patient is actually seeming to make good improvement in regard to his bilateral plantar foot wounds. We have been using a cast on the left side and on the right side he has been using dressings he is changing up his own accord. With that being said he tells me that he is also not walking as much just due to how unsteady he feels. He takes it easy when he does have to walk and when he does not have to walk he is resting. This is probably help in his right  foot as well has the left foot which is actually measuring better. In fact both are measuring better. Overall I am very pleased with how things seem to be progressing. The patient does have some odor on the left foot this does have me concerned about the possibility of infection, and actually probably go ahead and put him on antibiotics today as well as utilizing a continuation of the cast on the left foot I think that will be fine we probably just need to bring him in sooner to change this not last a whole week. 1/29; we brought the patient back today for a total contact cast change on the left out of concern for excessive drainage. We are using drawtex over the wound as the primary dressing 11/21/2019 on evaluation today patient appears to be doing well with regard to his left plantar foot. In fact both foot ulcers actually seem to be doing pretty well. Nonetheless he is having a lot of drainage on the left at this time and again we did obtain a wound culture did show positive for Staph aureus that was reviewed by myself today as well. Nonetheless he is on Bactrim which was shown to be sensitive that should be helping in this regard. Fortunately there is no signs of infection systemically at this point. 2/5; back in clinic today for a total contact cast change apparently secondary to very significant drainage. Still using drawtex 11/28/2019 upon evaluation today patient appears to be doing well with regard to his wounds. The right foot is doing okay as measured about the same in my opinion. The left foot is actually showing signs of significant improvement is measuring smaller there is a lot of hyper granulation likely due to the continued drainage at this point. We did obtain approval for a snap VAC I think that is good to be appropriate for him and will likely help this tremendously underneath the cast. He is definitely in agreement with proceeding with such. 2/12; patient came in today after his  snap VAC lost suction. Brought in to  see one of our nurses. The dressing was replaced and then we put the cast back on and rehooked up the snap VAC. Apparently his wound looked very good per our intake nurse. 2/15; again we replaced the cast on Friday. By Saturday the snap VAC and light suction. He called this morning he comes in acutely. The wounds look fine however the VAC is not functioning. We replaced the cast using silver alginate as the primary dressing backed with Kerramax. The snap VAC was not replaced 12/05/2019 upon evaluation today patient appears to be doing better in regard to his left plantar foot ulcer. Fortunately there is no signs of active infection at this time. Unfortunately he is continuing to have issues with the right foot he is really not making any progress here things seem to be somewhat stagnant to be honest. The depth has increased but that is due to me having debrided the wound in the past based on what I am seeing. 12/12/2019 on evaluation today patient appears to be doing more poorly in regard to the left lower extremity. He has some erythema spreading up the side of his foot I am concerned about infection again at this point. Unfortunately he has been seeing improvement with a total contact cast but I do not think we should put that on today. On his right plantar foot he continues to have significant drainage this is actually measuring deeper I really do not feel like you are making any progress whatsoever. I have prescribed Granix for him unfortunately his insurance apparently was going to cost him a $500 co-pay. 12/19/2019 upon evaluation today patient actually appears to be doing better in regard to both wounds. With that being said he actually did get the reGranix which he had to pay $500 for. With that being said it does look like that he is actually made some improvement based on what I am seeing at this point with the reGranix. Obviously if he is going to continue  this we are going to do something about trying to get him some help in covering the cost. 12/26/2019 on evaluation today patient appears to be doing really much better even compared to last week. Overall the wound seems to be much better even compared to last week and last week was better than the week before. Since has been using the reGranix his symptoms have improved significantly. With that being said the issue right now is simply that this is a very expensive medication for him the first dose cost him $500. Upon inspection patient's wound bed actually is however dramatically improved compared to before he started this 2 weeks ago. 01/02/2020 upon evaluation today patient appears to actually be doing well. He still had a little bit of reGranix left that has been using in small amounts he just been applying it every other day instead of every day in order to make it go longer. Overall we are still seeing excellent improvement he is measuring smaller looking better healthier tissue and everything seems to be pointing to this headed in the right direction. Fortunately there is no evidence of infection either which is also excellent news. He does have his MRI coming up within the next week. 01/09/2020 upon evaluation today patient appears to be doing a little worse today compared to previous week's evaluation. He is actually been out of the reGranix at this point. He has been trying to make the stretch out so he has been changing the dressings on a regular set  schedule like he was previous. I think this has made a difference. Fortunately there is no signs of active infection at this time. No fevers, chills, nausea, vomiting, or diarrhea. 01/16/2020 upon evaluation today patient appears to be doing well with regard to his left plantar foot ulcer. The right plantar foot still shows some significant depth at this point. Fortunately there is no signs of active infection at this time. 01/30/2020 upon evaluation  today patient appears to be doing about the same in regard to his right plantar foot ulcer there is still some depth here and we had to wait till he actually switched over to his new insurance to get approval for the MRI under his new insurance plan. With that being said he now has switched as of April 1. Fortunately there is no signs of active infection at this time. Overall in regard to his left foot ulcer this seems to be doing much better and I am actually very pleased with how things are going. With that being said it is not quite as much progress as we were seeing with the reGranix but at the same time he has had trouble getting this apparently there is been some hindrance here. I Ernie Hew try to actually send this to [facility] that was recommended by the drug rep to me. 02/13/20 upon evaluation today patient appears to be doing better in regard to his left foot ulcer this is great news. Unfortunately the right foot ulcer is not really significantly better at this time. There is no signs of active infection systemically though he did have his MRI which showed unfortunately he does have infection noted including an abscess in the foot. There is also marrow changes noted which are consistent with osteomyelitis based on the radiology review and interpretation. Unfortunately considering that the wound is not really making the progress that we will he would like to be seen I think that this is an indication that he may need some further referral both infectious disease as well as potentially to podiatrist to see if there is anything that can be done to help with the situation that were dealing with here. The left foot again is doing great. READMISSION 06/04/2021 This is a 61 year old man who was in the clinic in 2021 followed by Allen Derry for areas on the right and left foot. He developed a left foot infection and was referred to ID. He left the clinic in a nonhealed state and was followed for a  period of time [facility] Dr. Marylene Land. Apparently things really deteriorated in early July when he was admitted to hospital from 04/27/2021 through 05/06/2021 with sepsis secondary to a left foot infection. His blood cultures were negative. An MRI suggested fifth metatarsal osteomyelitis a left ankle septic joint. He was treated with vancomycin and ertapenem which he is still taking and may just about be finishing. He was seen by orthopedics and the patient adamantly refused to BKA. As far as I can tell he did not have the ankle aspirated I am not exactly sure what the issue was here. Since he has been discharged she is at [facility] health care for rehabilitation. He was last seen by Dr. Algis Liming on 05/20/2021 he noted osteo of the tibial talar bone cuboid and fifth metatarsal which is even more extensive than what was suggested by the MRI. He is apparently going for a consultation with orthopedic surgery in [place] sometime next week. I received a call about this man 2  weeks ago from Dr. Allyson Sabal who follows him for the possibility of PAD. ABIs I think done in the office showed a ABI on the right of 1.05 at the PTA and 0.99 at the PTA on the left. He had a DVT rule out in the left leg that was negative for the DVT. Past medical history is extensive and includes diastolic heart failure, right first metatarsal head ulcer in 2021, excision of the right second ray by Dr. Marylene Land on 03/13/2020, hypertension, hypothyroidism. Left total hip replacement, right total knee replacement, carpal tunnel syndrome, obstructive sleep apnea alcohol abuse with cirrhosis although the patient denies current alcohol intake. The patient does not think he is a diabetic however looking through [place] link I see 2 HgbAic's of this year that were greater than equal to 6.5 which by definition makes him diabetic. Nevertheless he is not on any treatment and does not check his blood sugars. The patient is now back home out  of the nursing home. Saw Dr. Algis Liming last week he was taken off vancomycin and ertapenem on August 22 and now is on doxycycline on Augmentin. He also saw Dr. Weston Anna who is his orthopedic surgeon in [place] he recommended a [employer]	[place]. He has been using Medihoney. 9/6I have been having trouble getting hyperbarics approved through our prior authorization process. Even though he had a limb threatening infection in the left foot and probably the left ankle there glitches in how some of the reports are worded also some of the consultants. In any case I am going to repeat his sedimentation rate and C-reactive protein. I am generally not in favor of doing things like this as they really do not alter the plan of care from my point of view however I am going to need to demonstrate that these remain high in order to get this through forhyperbaric treatment for chronic refractory osteomyelitis 9/13; following this man for a wound on his left plantar foot in the setting of type 2 diabetes and Charcot deformity. He has underlying chronic refractory osteomyelitis. Follow-up sedimentation rate and C-reactive protein were both elevated but the C-reactive protein was down to 1.4, sedimentation rate at 70. Sedimentation rate was only slightly down from previous at 85. His wound is measuring slightly smaller. 9/20; patient started hyperbaric oxygen therapy today and tolerated treatment well. This is for the underlying osteomyelitis. He remains on antibiotics but thinks he is getting close to finishing. The wound on the plantar aspect of his foot is the other issue we are following here. He is using Medihoney The patient has a Charcot foot in the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

## 2022-05-28 ENCOUNTER — Ambulatory Visit (INDEPENDENT_AMBULATORY_CARE_PROVIDER_SITE_OTHER): Payer: Medicare Other | Admitting: Podiatry

## 2022-05-28 DIAGNOSIS — M14672 Charcot's joint, left ankle and foot: Secondary | ICD-10-CM

## 2022-05-28 DIAGNOSIS — L97522 Non-pressure chronic ulcer of other part of left foot with fat layer exposed: Secondary | ICD-10-CM | POA: Diagnosis not present

## 2022-06-01 ENCOUNTER — Telehealth: Payer: Self-pay | Admitting: *Deleted

## 2022-06-01 NOTE — Telephone Encounter (Signed)
Patient is calling because his foot ulcer stated bleeding after using blast x and defender boot , this had not happened in a long time. He discontinued the blast x, starting back using the silver alginate, is this ok? Please advise.

## 2022-06-01 NOTE — Telephone Encounter (Signed)
Called patient to let him know that physician is ok with changing to silver alginate, he verbalized understanding but still wants to try the blast x but wanted to know if bleeding is common w/ using it. Please advise.

## 2022-06-02 ENCOUNTER — Ambulatory Visit (INDEPENDENT_AMBULATORY_CARE_PROVIDER_SITE_OTHER): Payer: Medicare Other | Admitting: Infectious Disease

## 2022-06-02 ENCOUNTER — Encounter: Payer: Self-pay | Admitting: Infectious Disease

## 2022-06-02 ENCOUNTER — Other Ambulatory Visit: Payer: Self-pay

## 2022-06-02 VITALS — BP 137/85 | HR 93 | Resp 16 | Ht 73.0 in | Wt 246.0 lb

## 2022-06-02 DIAGNOSIS — I5033 Acute on chronic diastolic (congestive) heart failure: Secondary | ICD-10-CM

## 2022-06-02 DIAGNOSIS — L089 Local infection of the skin and subcutaneous tissue, unspecified: Secondary | ICD-10-CM | POA: Diagnosis not present

## 2022-06-02 DIAGNOSIS — E11628 Type 2 diabetes mellitus with other skin complications: Secondary | ICD-10-CM | POA: Diagnosis not present

## 2022-06-02 DIAGNOSIS — M86171 Other acute osteomyelitis, right ankle and foot: Secondary | ICD-10-CM | POA: Diagnosis not present

## 2022-06-02 DIAGNOSIS — M62541 Muscle wasting and atrophy, not elsewhere classified, right hand: Secondary | ICD-10-CM

## 2022-06-02 DIAGNOSIS — M62549 Muscle wasting and atrophy, not elsewhere classified, unspecified hand: Secondary | ICD-10-CM

## 2022-06-02 DIAGNOSIS — M87052 Idiopathic aseptic necrosis of left femur: Secondary | ICD-10-CM

## 2022-06-02 HISTORY — DX: Muscle wasting and atrophy, not elsewhere classified, unspecified hand: M62.549
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
## 2022-06-02 NOTE — Telephone Encounter (Signed)
Patient giving information per physician thru voice message.

## 2022-06-03 ENCOUNTER — Telehealth: Payer: Self-pay | Admitting: Adult Health

## 2022-06-03 DIAGNOSIS — G4733 Obstructive sleep apnea (adult) (pediatric): Secondary | ICD-10-CM

## 2022-06-03 NOTE — Telephone Encounter (Signed)
Patient states he needs his CPAP settings adjusted. States after 30 minutes it puts off too much oxygen where it inflates his cheeks so he has to restart it. Uses [employer].  Please advise.

## 2022-06-03 NOTE — Telephone Encounter (Signed)
Called and left voicemail for patient to call back in regards to cpap machine

## 2022-06-04 NOTE — Telephone Encounter (Signed)
Change CPAP pressure to CPAP auto 5 to 16 cm H2O.  Download in 4 weeks

## 2022-06-04 NOTE — Telephone Encounter (Signed)
Please get CPAP download

## 2022-06-04 NOTE — Telephone Encounter (Signed)
Spoke with pt and let him know of change in pressure order. Pt stated understanding. Nothing further needed at this time.

## 2022-06-04 NOTE — Telephone Encounter (Signed)
Download printed and given to [facility] to give to [employer]
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
## 2022-06-04 NOTE — Telephone Encounter (Signed)
Called patient and he states that he is wanting his setting to be looked at. He states that when he first lays down his machine is giving off a lot of air and pressure. He states he feels like its blowing out of the machine and the corners of the mask.   Please advise Tammy

## 2022-06-09 ENCOUNTER — Encounter: Payer: Self-pay | Admitting: Podiatry

## 2022-06-09 NOTE — Telephone Encounter (Signed)
Mychart message sent by pt: [name] [facility] (supporting Julio Sicks, NP) 44 minutes ago (1:24 PM)    I called on Friday 8/18 regarding an adjustment on my CPAP. After about 30 minutes it begins to blow out hard enough to blow out my cheeks so they are puffed out. If I try to close my mouth and breath through my nose my ears clog. I believe the output needs to be reduced. [employer] said it can only be adjusted by my doctor. Thanks [name] DOB 2061/01/10      Tammy, please advise on this. Download has been printed of pt's data for you to review.

## 2022-06-10 NOTE — Telephone Encounter (Signed)
Please see previous phone message, on 06/04/22 I sent message to change to CPAP auto 5 to 16cm H2O. Did this not get sent ?

## 2022-06-11 ENCOUNTER — Encounter (HOSPITAL_BASED_OUTPATIENT_CLINIC_OR_DEPARTMENT_OTHER): Payer: Medicare Other | Admitting: Internal Medicine

## 2022-06-11 DIAGNOSIS — E11621 Type 2 diabetes mellitus with foot ulcer: Secondary | ICD-10-CM | POA: Diagnosis not present

## 2022-06-11 DIAGNOSIS — L97522 Non-pressure chronic ulcer of other part of left foot with fat layer exposed: Secondary | ICD-10-CM | POA: Diagnosis not present

## 2022-06-11 NOTE — Progress Notes (Signed)
[name] ([id number]) Visit Report for 06/11/2022 Chief Complaint Document Details Patient Name: Date of Service: [name], [name] 06/11/2022 9:30 A M Medical Record Number: [id number] Patient Account Number: [id number] Date of Birth/Sex: Treating RN: [birth year] (61 y.o. [name] Primary Care Provider: Billey Gosling Other Clinician: Referring Provider: Treating Provider/Extender: Lucile Crater Weeks in Treatment: 5 Information Obtained from: Patient Chief Complaint Left foot ulcer Electronic Signature(s) Signed: 06/11/2022 10:24:04 AM By: Kalman Shan DO Entered By: Kalman Shan on 06/11/2022 10:15:50 -------------------------------------------------------------------------------- Debridement Details Patient Name: Date of Service: [name] 06/11/2022 9:30 A M Medical Record Number: [id number] Patient Account Number: [id number] Date of Birth/Sex: Treating RN: 09/07/1961 (61 y.o. [name] Primary Care Provider: Billey Gosling Other Clinician: Referring Provider: Treating Provider/Extender: Lucile Crater Weeks in Treatment: 5 Debridement Performed for Assessment: Wound #5 Left,Plantar Foot Performed By: Physician Kalman Shan, DO Debridement Type: Debridement Severity of Tissue Pre Debridement: Fat layer exposed Level of Consciousness (Pre-procedure): Awake and Alert Pre-procedure Verification/Time Out Yes - 09:50 Taken: Start Time: 09:51 Pain Control: Lidocaine 5% topical ointment T Area Debrided (L x W): otal 3 (cm) x 3 (cm) = 9 (cm) Tissue and other material debrided: Viable, Non-Viable, Callus, Slough, Subcutaneous, Biofilm, Slough, Hyper-granulation Level: Skin/Subcutaneous Tissue Debridement Description: Excisional Instrument: Curette Bleeding: Minimum Hemostasis Achieved: Silver Nitrate End Time: 09:57 Procedural Pain: 0 Post Procedural Pain: 0 Response to Treatment: Procedure was tolerated well Level of  Consciousness (Post- Awake and Alert procedure): Post Debridement Measurements of Total Wound Length: (cm) 2.5 Width: (cm) 2.9 Depth: (cm) 0.4 Volume: (cm) 2.278 Character of Wound/Ulcer Post Debridement: Improved Severity of Tissue Post Debridement: Fat layer exposed Post Procedure Diagnosis Same as Pre-procedure Electronic Signature(s) Signed: 06/11/2022 10:24:04 AM By: Kalman Shan DO Signed: 06/11/2022 5:57:00 PM By: Deon Pilling RN, BSN Entered By: Deon Pilling on 06/11/2022 09:57:30 -------------------------------------------------------------------------------- HPI Details Patient Name: Date of Service: [name] 06/11/2022 9:30 A M Medical Record Number: [id number] Patient Account Number: [id number] Date of Birth/Sex: Treating RN: February 12, 1961 (61 y.o. [name] Primary Care Provider: Billey Gosling Other Clinician: Referring Provider: Treating Provider/Extender: Mickle Asper in Treatment: 5 History of Present Illness HPI Description: 10/31/2019 upon evaluation today patient appears to be doing somewhat poorly in regard to his bilateral plantar feet. He has wounds that he tells me have been present since 2012 intermittently off and on. Most recently this has been open for at least the past 6 months to a year. He has been trying to treat this in different ways using Santyl along with various other dressings including Medihoney and even at one point Xeroform. Nothing really has seem to get this completely closed. He was recently in the hospital for cellulitis of his leg subsequently he did have x-rays as well as MRIs that showed negative for any signs of osteomyelitis in regard to the wounds on his feet. Fortunately there is no signs of systemic infection at this time. No fevers, chills, nausea, vomiting, or diarrhea. Patient has previously used Darco offloading shoes as far as frontal floaters as well as postop surgical shoes. He has never been  in a total contact cast that may be something we need to strongly consider here. Patient's most recent hemoglobin A1c 1 month ago was 5.3 seems to be very well controlled which is great. Subsequently he has seen vascular as well as podiatry. His ABIs are 1.07 on the left and 1.14 on the right he seems to be doing  well he does have chronic venous stasis. 11/07/2019 upon evaluation today patient appears to be doing well with regard to his wounds all things considered. I do not see any severe worsening he still has some callus buildup on the right more than the left he notes that he has been probably more active than he should as far as walking is concerned is just very hard to not be active. He knows he needs to be more careful in this regard however. He is willing to give the cast a try at this point although he notes that he is a little nervous about this just with regard to balance although he will be very careful and obviously if he has any trouble he knows to contact the office and let me know. 1/22; patient is in for his obligatory first total contact cast change. Our intake nurse reported a very large amount of drainage which is spelled out over to the surrounding skin. Has bilateral diabetic foot wounds. He has Charcot feet. We have been using silver alginate on his wounds. 11/14/2019 on evaluation today patient is actually seeming to make good improvement in regard to his bilateral plantar foot wounds. We have been using a cast on the left side and on the right side he has been using dressings he is changing up his own accord. With that being said he tells me that he is also not walking as much just due to how unsteady he feels. He takes it easy when he does have to walk and when he does not have to walk he is resting. This is probably help in his right foot as well has the left foot which is actually measuring better. In fact both are measuring better. Overall I am very pleased with how  things seem to be progressing. The patient does have some odor on the left foot this does have me concerned about the possibility of infection, and actually probably go ahead and put him on antibiotics today as well as utilizing a continuation of the cast on the left foot I think that will be fine we probably just need to bring him in sooner to change this not last a whole week. 1/29; we brought the patient back today for a total contact cast change on the left out of concern for excessive drainage. We are using drawtex over the wound as the primary dressing 11/21/2019 on evaluation today patient appears to be doing well with regard to his left plantar foot. In fact both foot ulcers actually seem to be doing pretty well. Nonetheless he is having a lot of drainage on the left at this time and again we did obtain a wound culture did show positive for Staph aureus that was reviewed by myself today as well. Nonetheless he is on Bactrim which was shown to be sensitive that should be helping in this regard. Fortunately there is no signs of infection systemically at this point. 2/5; back in clinic today for a total contact cast change apparently secondary to very significant drainage. Still using drawtex 11/28/2019 upon evaluation today patient appears to be doing well with regard to his wounds. The right foot is doing okay as measured about the same in my opinion. The left foot is actually showing signs of significant improvement is measuring smaller there is a lot of hyper granulation likely due to the continued drainage at this point. We did obtain approval for a snap VAC I think that is good to be appropriate for  him and will likely help this tremendously underneath the cast. He is definitely in agreement with proceeding with such. 2/12; patient came in today after his snap VAC lost suction. Brought in to see one of our nurses. The dressing was replaced and then we put the cast back on and rehooked up the  snap VAC. Apparently his wound looked very good per our intake nurse. 2/15; again we replaced the cast on Friday. By Saturday the snap VAC and light suction. He called this morning he comes in acutely. The wounds look fine however the VAC is not functioning. We replaced the cast using silver alginate as the primary dressing backed with Kerramax. The snap VAC was not replaced 12/05/2019 upon evaluation today patient appears to be doing better in regard to his left plantar foot ulcer. Fortunately there is no signs of active infection at this time. Unfortunately he is continuing to have issues with the right foot he is really not making any progress here things seem to be somewhat stagnant to be honest. The depth has increased but that is due to me having debrided the wound in the past based on what I am seeing. 12/12/2019 on evaluation today patient appears to be doing more poorly in regard to the left lower extremity. He has some erythema spreading up the side of his foot I am concerned about infection again at this point. Unfortunately he has been seeing improvement with a total contact cast but I do not think we should put that on today. On his right plantar foot he continues to have significant drainage this is actually measuring deeper I really do not feel like you are making any progress whatsoever. I have prescribed Granix for him unfortunately his insurance apparently was going to cost him a $500 co-pay. 12/19/2019 upon evaluation today patient actually appears to be doing better in regard to both wounds. With that being said he actually did get the reGranix which he had to pay $500 for. With that being said it does look like that he is actually made some improvement based on what I am seeing at this point with the reGranix. Obviously if he is going to continue this we are going to do something about trying to get him some help in covering the cost. 12/26/2019 on evaluation today patient appears to be  doing really much better even compared to last week. Overall the wound seems to be much better even compared to last week and last week was better than the week before. Since has been using the reGranix his symptoms have improved significantly. With that being said the issue right now is simply that this is a very expensive medication for him the first dose cost him $500. Upon inspection patient's wound bed actually is however dramatically improved compared to before he started this 2 weeks ago. 01/02/2020 upon evaluation today patient appears to actually be doing well. He still had a little bit of reGranix left that has been using in small amounts he just been applying it every other day instead of every day in order to make it go longer. Overall we are still seeing excellent improvement he is measuring smaller looking better healthier tissue and everything seems to be pointing to this headed in the right direction. Fortunately there is no evidence of infection either which is also excellent news. He does have his MRI coming up within the next week. 01/09/2020 upon evaluation today patient appears to be doing a little worse today compared to  previous week's evaluation. He is actually been out of the reGranix at this point. He has been trying to make the stretch out so he has been changing the dressings on a regular set schedule like he was previous. I think this has made a difference. Fortunately there is no signs of active infection at this time. No fevers, chills, nausea, vomiting, or diarrhea. 01/16/2020 upon evaluation today patient appears to be doing well with regard to his left plantar foot ulcer. The right plantar foot still shows some significant depth at this point. Fortunately there is no signs of active infection at this time. 01/30/2020 upon evaluation today patient appears to be doing about the same in regard to his right plantar foot ulcer there is still some depth here and we had to wait  till he actually switched over to his new insurance to get approval for the MRI under his new insurance plan. With that being said he now has switched as of April 1. Fortunately there is no signs of active infection at this time. Overall in regard to his left foot ulcer this seems to be doing much better and I am actually very pleased with how things are going. With that being said it is not quite as much progress as we were seeing with the reGranix but at the same time he has had trouble getting this apparently there is been some hindrance here. I Georgina Peer try to actually send this to [facility] that was recommended by the drug rep to me. 02/13/20 upon evaluation today patient appears to be doing better in regard to his left foot ulcer this is great news. Unfortunately the right foot ulcer is not really significantly better at this time. There is no signs of active infection systemically though he did have his MRI which showed unfortunately he does have infection noted including an abscess in the foot. There is also marrow changes noted which are consistent with osteomyelitis based on the radiology review and interpretation. Unfortunately considering that the wound is not really making the progress that we will he would like to be seen I think that this is an indication that he may need some further referral both infectious disease as well as potentially to podiatrist to see if there is anything that can be done to help with the situation that were dealing with here. The left foot again is doing great. READMISSION 06/04/2021 This is a 61 year old man who was in the clinic in 2021 followed by Jeri Cos for areas on the right and left foot. He developed a left foot infection and was referred to ID. He left the clinic in a nonhealed state and was followed for a period of [facility] Cannon Kettle. Apparently things really deteriorated in early July when he was admitted to hospital from  04/27/2021 through 05/06/2021 with sepsis secondary to a left foot infection. His blood cultures were negative. An MRI suggested fifth metatarsal osteomyelitis a left ankle septic joint. He was treated with vancomycin and ertapenem which he is still taking and may just about be finishing. He was seen by orthopedics and the patient adamantly refused to BKA. As far as I can tell he did not have the ankle aspirated I am not exactly sure what the issue was here. Since he has been discharged she is at [facility] for rehabilitation. He was last seen by Dr. Drucilla Schmidt on 05/20/2021 he noted osteo of the tibial talar bone cuboid and fifth metatarsal which  is even more extensive than what was suggested by the MRI. He is apparently going for a consultation with orthopedic surgery in [place] sometime next week. I received a call about this man 2 weeks ago from Dr. Gwenlyn Found who follows him for the possibility of PAD. ABIs I think done in the office showed a ABI on the right of 1.05 at the PTA and 0.99 at the PTA on the left. He had a DVT rule out in the left leg that was negative for the DVT. Past medical history is extensive and includes diastolic heart failure, right first metatarsal head ulcer in 2021, excision of the right second ray by Dr. Cannon Kettle on 03/13/2020, hypertension, hypothyroidism. Left total hip replacement, right total knee replacement, carpal tunnel syndrome, obstructive sleep apnea alcohol abuse with cirrhosis although the patient denies current alcohol intake. The patient does not think he is a diabetic however looking through [place] link I see 2 HgbAic's of this year that were greater than equal to 6.5 which by definition makes him diabetic. Nevertheless he is not on any treatment and does not check his blood sugars. The patient is now back home out of the nursing home. Saw Dr. Drucilla Schmidt last week he was taken off vancomycin and ertapenem on August 22 and now is on doxycycline on Augmentin.  He also saw Dr. Buford Dresser who is his orthopedic surgeon in [place] he recommended a [employer]. He has been using Medihoney. 9/6I have been having trouble getting hyperbarics approved through our prior authorization process. Even though he had a limb threatening infection in the left foot and probably the left ankle there glitches in how some of the reports are worded also some of the consultants. In any case I am going to repeat his sedimentation rate and C-reactive protein. I am generally not in favor of doing things like this as they really do not alter the plan of care from my point of view however I am going to need to demonstrate that these remain high in order to get this through forhyperbaric treatment for chronic refractory osteomyelitis 9/13; following this man for a wound on his left plantar foot in the setting of type 2 diabetes and Charcot deformity. He has underlying chronic refractory osteomyelitis. Follow-up sedimentation rate and C-reactive protein were both elevated but the C-reactive protein was down to 1.4, sedimentation rate at 70. Sedimentation rate was only slightly down from previous at 85. His wound is measuring slightly smaller. 9/20; patient started hyperbaric oxygen therapy today and tolerated treatment well. This is for the underlying osteomyelitis. He remains on antibiotics but thinks he is getting close to finishing. The wound on the plantar aspect of his foot is the other issue we are following here. He is using Medihoney The patient has a Charcot foot in the setting of type 2 diabetes. He is going to need a total contact cast although his partner was away this week and we elected to delay this till next week 9/27; patient still tolerating hyperbaric oxygen well. Wound looked generally healthy not much depth under illumination still 100% covered in fibrinous debris. Raised callused edges around the wound he was prepared for a total contact cast. We have been using  Medihoney 9/30; patient is back for his first obligatory total contact cast change. We are using Hydrofera Blue. Noted by our intake nurse to have a lot of drainage or at least a moderate amount of drainage. I am not sure I was previously aware of this 10/4;  patient arrives today with a lot of drainage under the cast. When he had it changed last Friday there was also a similar amount of drainage. Her intake nurse says that they tried a wound VAC on him perhaps while I was on vacation in August under the cast but that did not work. In my experience that has not been unusual. We have been using Hydrofera Blue with all the secondary absorbers. The drainage today went right through all of our dressings. The patient is concerned about his foot being in a cast without much drainage. He is tolerating HBO well. There has been improvements in the wound in the mid part of his foot in the setting of a Charcot deformity 10/7; patient presents for cast change. He has no issues or complaints today. He denies signs of infection. 10/11; patient presents for cast change. At this time he would like to take a break from the cast. He would like to do daily dressing changes with Hydrofera Blue. He currently denies signs of infection. 10/14; his cast was taken off last week at his request. He arrives in the clinic with [facility]. He has been changing the dressing himself. He has way too much edema in the left foot and leg to consider a total contact cast. I do not really know the issue here. He does have chronic venous insufficiency His wife stopped me in the clinic earlier in the week to report he is drinking again and she is concerned. I am uncertain whether there are other issues 10/18; he arrived in clinic last week having bilateral lower extremity edema likely secondary to chronic venous insufficiency there was too much edema in the left leg to apply a total contact cast. I put him in compression on the left leg  to control the swelling. This week he cut the wrap on the foot for reasons that are unclear however today he arrives in clinic with a smaller left leg but massive edema in the left foot. He is supposed to be wearing a juxta lite stocking on the right leg but he is not wearing the contact layer. His attendance at hyperbaric oxygen has been dwindling, he did not dive yesterday and he did not dive today concerned about hyperbarics causing swelling. I looked back in his record his last echo was in 2020 essentially normal left and right ventricular function. Last BUN and creatinine were done in July this was normal. He is having a lot of drainage in the left plantar foot wound 10/25; again he comes in today having missed HBO yesterday. Macerated skin around the wound which really does not look very good at all. A lot of edema in the left foot but an improvement in edema on the left leg. We wrapped him last week because of the amount of swelling in the left leg. We could not apply a total contact cast. The patient states that he wants to be able to change his dressing himself. I might consider this if he had stockings to control the swelling. He said he be here for HBO tomorrow. I will check the degree of erythema in his forefoot which I have marked. He is on doxycycline and Augmentin which was renewed by Dr. Drucilla Schmidt but I cannot see a follow-up note, follow-up inflammatory markers etc. I 1 point he said he was going to see his orthopedic surgeon in [place] I am not sure if he ever did this. I do not know why he has not followed  up with infectious disease, he says he was not given an appointment but he is still on Augmentin doxycycline 11/1; he did not do the lab work I ordered last week. Still on Augmentin and doxycycline. Silver alginate and he is changing this daily using his own juxta lite stocking. The surface of the wound does not look too bad. No real epithelialization however. The patient was seen  today along with HBO 08/26/2021 upon evaluation today patient is being seen at his request by myself he wanted to transfer care to see me. With that being said he has been seeing Dr. Dellia Nims since he came back in August of this year. Unfortunately he tells me he still having quite a bit of drainage. He is also significant erythema and warmth noted of the foot as well. He has been hit or miss with regard to hyperbaric oxygen therapy tells me that at this point he took this week off because he was extremely claustrophobic and having a lot of issues he plans to start back next week. Nonetheless he has been given some medication by Dr. Dellia Nims as well to help calm things down which again may help him. Hopefully he can get back in hyperbarics as I think this is likely necessary. Nonetheless there does seem to be evidence of cellulitis noted today as well and in general I am a little concerned in that regard. His wound unfortunately is significant on this left foot and is right where he takes the brunt of the force secondary to the Charcot arthropathy. I do not think a total contact cast is ideal due to the fact that he unfortunately is draining much too significantly he is also not happy with the idea of using a cast therefore he states he would not want to do that anyway. 09/16/2021 upon evaluation today patient's plantar foot ulcer unfortunately continues to show signs of issues here. He has been in the hospital due to trying to detox himself from alcohol he had withdrawal symptoms and subsequently was hospitalized. During that time it was recommended by both orthopedics as well as infectious disease apparently that he proceed with amputation. With that being said they discussed with him the risk of not doing so. This is well- documented in the encounter. With that being said the patient does not want to proceed down that road and tells me that he declined their advice in that regard. Subsequently our goal  then is to try to do what we can to try to get this thing healed and closed. I think this means he is getting need to stay off of it is much as possible he does have a wheelchair at home that he tells me he can use I think that that is going to be something that he does need to do. 09/23/2021 upon evaluation today patient continues to have significant issues here with his foot ulcer which is plantar on the left foot. Subsequently again he does have a history of Charcot foot he also has an issue of having had osteomyelitis as well as an open wound for some time here. Its been recommended multiple times for him to have an amputation although that is not something that he is really interested or wanting to do. 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 Subsequently this means that he also has a gait abnormality that is causing issues with his walking and causing abnormal pressures in the central part of his foot  this is the reason he has a wound. Nonetheless I want to see what I can do about trying to get a boot to help with stabilization and offloading of working to see what we can do in that regard. 09/30/2021 upon evaluation today patient appears to be doing well with regard to his plantar foot ulcer. He still has significant granular hypergranulation but again this is much less than what it was even last time I saw him last week. Fortunately I think we are headed in the right direction. This is definitely something we could consider a skin graft or something along those lines if we can get it flattened out enough and get the drainage from being so significant. I feel like we are making some headway here. 10/07/2021 upon evaluation today patient appears to be doing decently well in regard to his wound. Fortunately there does not appear to be any signs of infection overall I feel like it is actually showing some signs of improvement. He still has some  hypergranulation but this is much less than its been. 10/21/2021 upon evaluation today patient appears to be doing well with regard to his wound is actually showing some signs of improvement which is great. We have been doing the chemical cauterization with silver nitrate as well as debridement as needed and again has been using silver alginate up to this point. With that being said he does have some Hydrofera Blue at home he wonders if that can be beneficial at this point. 11/25/2021 upon evaluation today patient appears to be doing somewhat poorly in regard to his plantar foot ulcer. He is also been having some issues with his congestive heart failure. He has recently been in the hospital. I did review those notes as well and apparently his heart failure has been somewhat out of control. He is actually being managed as an outpatient in this regard but nonetheless this is something that can still be kept a close eye on according to the notes and what I see. Fortunately I do not see any signs of active infection at this time which is great news. Nonetheless I am concerned about the boggy central portion of the wound which I think is going to likely open up in the near future. Readmission: 01-27-2022 patient presents for follow-up here in the clinic today. His last been November 25, 2021 since have seen him. At that time we just ordered an MRI fortunately the MRI did not show any obvious signs of osteomyelitis and there were some reactive changes which were questionable and could not be excluded. With that being said in the interim since have seen him back on February 8 things have gotten worse from the standpoint of how the wound appears today. I do think that he is going to require potentially more debridement to clean this wound out than what I can even offer here in the clinic there is a tremendous amount of hypergranulation tissue which is not sufficient to grow new tissue over and honestly I think this  is going to lead to a delay in healing if its not taking care of. I need to see if I can get him into see a surgeon to get an opinion on whether or not they could take him to the OR for an aggressive surgical debridement to clear out this hypergranulation tissue and achieve hemostasis which is can be the biggest issue with me here in the clinic as he does tend to bleed even with very light  superficial debridements. The patient's not opposed to this and he states that if I have someone that like for him to see he would be happy to go. Otherwise his medical history really has not changed. He does tell me he is getting ready to go for an evaluation for CPAP machine. Readmission 05/04/2022 Patient was last seen 3 months ago. He states he went to rehab for alcohol abuse. He continues to have a wound on the plantar aspect of the left foot. He states this has never completely healed over the past several years. He is following with podiatry for this issue currently and they are recommending blast X with collagen and a cam boot. He has not started this yet. He has home health. Currently he is using silver alginate. He also follows with Dr. Drucilla Schmidt infectious disease who ordered an MRI of the foot as he is concerned for osteomyelitis. CRP and sed rate are elevated. He is currently on Augmentin for prophylaxis as he has a history of osteomyelitis to this foot. Infectious disease has recommended amputation. Currently patient denies systemic signs of infection. 8/7; patient is following up with podiatry on 8/11. He wanted to come in to be debrided. He has no issues or complaints today. He has been using silver alginate to the wound bed. He has home health that helps change the dressings. He had an MRI completed on 7/18 that did not show evidence of osteomyelitis. He is scheduled to see infectious disease on 8/16. 8/25; Patient is following with podiatry for his wound care and he is currently using silver alginate  and a defender boot for his Left foot wound. He would like to have debridement to the wound but is unable to see podiatry until the end of the month. He has asked if we could debride him. Electronic Signature(s) Signed: 06/11/2022 10:24:04 AM By: Kalman Shan DO Entered By: Kalman Shan on 06/11/2022 10:17:53 -------------------------------------------------------------------------------- Physical Exam Details Patient Name: Date of Service: [name], [name] 06/11/2022 9:30 A M Medical Record Number: [id number] Patient Account Number: [id number] Date of Birth/Sex: Treating RN: 14-Sep-1961 (61 y.o. [name] Primary Care Provider: Billey Gosling Other Clinician: Referring Provider: Treating Provider/Extender: Lucile Crater Weeks in Treatment: 5 Constitutional respirations regular, non-labored and within target range for patient.. Cardiovascular 2+ dorsalis pedis/posterior tibialis pulses. Psychiatric pleasant and cooperative. Notes Left foot: T the plantar aspect there is an open wound with pale granulation tissue with a thin layer of film, Hyper granulated areas and callus. Undermining at o the 4 to 6 o'clock position. No surrounding signs of infection. Electronic Signature(s) Signed: 06/11/2022 10:24:04 AM By: Kalman Shan DO Entered By: Kalman Shan on 06/11/2022 10:18:53 -------------------------------------------------------------------------------- Physician Orders Details Patient Name: Date of Service: [name] 06/11/2022 9:30 A M Medical Record Number: [id number] Patient Account Number: [id number] Date of Birth/Sex: Treating RN: 06-Jul-1961 (61 y.o. [name] Primary Care Provider: Billey Gosling Other Clinician: Referring Provider: Treating Provider/Extender: Mickle Asper in Treatment: 5 Verbal / Phone Orders: No Diagnosis Coding ICD-10 Coding Code Description E11.621 Type 2 diabetes mellitus with foot  ulcer L97.522 Non-pressure chronic ulcer of other part of left foot with fat layer exposed M86.372 Chronic multifocal osteomyelitis, left ankle and foot M14.672 Charcot's joint, left ankle and foot Follow-up Appointments Other: - Continue to follow with Infectious Disease and Podiatry, call for appointment if Podiatry treatment not effective. Anesthetic Wound #5 Left,Plantar Foot (In clinic) Topical Lidocaine 5% applied to wound bed Bathing/ Shower/ Hygiene  May shower and wash wound with soap and water. - prior to dressing change Edema Control - Lymphedema / SCD / Other Elevate legs to the level of the heart or above for 30 minutes daily and/or when sitting, a frequency of: - throughout the day Avoid standing for long periods of time. Exercise regularly Moisturize legs daily. Compression stocking or Garment 30-40 mm/Hg pressure to: - wear the Juxtalite HD to right and left leg. apply in the morning and remove at night. Off-Loading Other: - Defender Boot: minimal weight bearing left foot use wheelchair for mobility. Additional Orders / Instructions Follow Nutritious Diet Home Health No change in wound care orders this week; continue Home Health for wound care. May utilize formulary equivalent dressing for wound treatment orders unless otherwise specified. - weekly for dressing changes. Other Home Health Orders/Instructions: - [facility] Wound Treatment Wound #5 - Foot Wound Laterality: Plantar, Left Cleanser: Wound Cleanser 1 x Per Day/30 Days Discharge Instructions: Cleanse the wound with wound cleanser prior to applying a clean dressing using gauze sponges, not tissue or cotton balls. Prim Dressing: KerraCel Ag Gelling Fiber Dressing, 2x2 in (silver alginate) (Home Health) 1 x Per Day/30 Days ary Discharge Instructions: Apply silver alginate to wound bed as instructed Secondary Dressing: ABD Pad, 5x9 ([place]) 1 x Per Day/30 Days Discharge Instructions: Apply over  primary dressing as directed. Secured With: Elastic Bandage 4 inch (ACE bandage) (Home Health) 1 x Per Day/30 Days Discharge Instructions: Secure with ACE bandage. Secured With: [employer], 4.5x3.1 (in/yd) (Home Health) 1 x Per Day/30 Days Discharge Instructions: Secure with Kerlix as directed. Secured With: Paper Tape, 2x10 (in/yd) (Home Health) 1 x Per Day/30 Days Discharge Instructions: Secure dressing with tape as directed. Electronic Signature(s) Signed: 06/11/2022 10:24:04 AM By: Kalman Shan DO Entered By: Kalman Shan on 06/11/2022 10:19:06 -------------------------------------------------------------------------------- Problem List Details Patient Name: Date of Service: [name] 06/11/2022 9:30 A M Medical Record Number: [id number] Patient Account Number: [id number] Date of Birth/Sex: Treating RN: [birth year] (61 y.o. [name] Primary Care Provider: Billey Gosling Other Clinician: Referring Provider: Treating Provider/Extender: Lucile Crater Weeks in Treatment: 5 Active Problems ICD-10 Encounter Code Description Active Date MDM Diagnosis E11.621 Type 2 diabetes mellitus with foot ulcer 05/04/2022 No Yes L97.522 Non-pressure chronic ulcer of other part of left foot with fat layer exposed 05/04/2022 No Yes M86.372 Chronic multifocal osteomyelitis, left ankle and foot 05/04/2022 No Yes M14.672 Charcot's joint, left ankle and foot 05/04/2022 No Yes Inactive Problems Resolved Problems Electronic Signature(s) Signed: 06/11/2022 10:24:04 AM By: Kalman Shan DO Entered By: Kalman Shan on 06/11/2022 10:15:41 -------------------------------------------------------------------------------- Progress Note Details Patient Name: Date of Service: [name] 06/11/2022 9:30 A M Medical Record Number: [id number] Patient Account Number: [id number] Date of Birth/Sex: Treating RN: 1961/05/08 (61 y.o. [name] Primary Care  Provider: Billey Gosling Other Clinician: Referring Provider: Treating Provider/Extender: Mickle Asper in Treatment: 5 Subjective Chief Complaint Information obtained from Patient Left foot ulcer History of Present Illness (HPI) 10/31/2019 upon evaluation today patient appears to be doing somewhat poorly in regard to his bilateral plantar feet. He has wounds that he tells me have been present since 2012 intermittently off and on. Most recently this has been open for at least the past 6 months to a year. He has been trying to treat this in different ways using Santyl along with various other dressings including Medihoney and even at one point Xeroform. Nothing really has seem to  get this completely closed. He was recently in the hospital for cellulitis of his leg subsequently he did have x-rays as well as MRIs that showed negative for any signs of osteomyelitis in regard to the wounds on his feet. Fortunately there is no signs of systemic infection at this time. No fevers, chills, nausea, vomiting, or diarrhea. Patient has previously used Darco offloading shoes as far as frontal floaters as well as postop surgical shoes. He has never been in a total contact cast that may be something we need to strongly consider here. Patient's most recent hemoglobin A1c 1 month ago was 5.3 seems to be very well controlled which is great. Subsequently he has seen vascular as well as podiatry. His ABIs are 1.07 on the left and 1.14 on the right he seems to be doing well he does have chronic venous stasis. 11/07/2019 upon evaluation today patient appears to be doing well with regard to his wounds all things considered. I do not see any severe worsening he still has some callus buildup on the right more than the left he notes that he has been probably more active than he should as far as walking is concerned is just very hard to not be active. He knows he needs to be more careful in this regard  however. He is willing to give the cast a try at this point although he notes that he is a little nervous about this just with regard to balance although he will be very careful and obviously if he has any trouble he knows to contact the office and let me know. 1/22; patient is in for his obligatory first total contact cast change. Our intake nurse reported a very large amount of drainage which is spelled out over to the surrounding skin. Has bilateral diabetic foot wounds. He has Charcot feet. We have been using silver alginate on his wounds. 11/14/2019 on evaluation today patient is actually seeming to make good improvement in regard to his bilateral plantar foot wounds. We have been using a cast on the left side and on the right side he has been using dressings he is changing up his own accord. With that being said he tells me that he is also not walking as much just due to how unsteady he feels. He takes it easy when he does have to walk and when he does not have to walk he is resting. This is probably help in his right foot as well has the left foot which is actually measuring better. In fact both are measuring better. Overall I am very pleased with how things seem to be progressing. The patient does have some odor on the left foot this does have me concerned about the possibility of infection, and actually probably go ahead and put him on antibiotics today as well as utilizing a continuation of the cast on the left foot I think that will be fine we probably just need to bring him in sooner to change this not last a whole week. 1/29; we brought the patient back today for a total contact cast change on the left out of concern for excessive drainage. We are using drawtex over the wound as the primary dressing 11/21/2019 on evaluation today patient appears to be doing well with regard to his left plantar foot. In fact both foot ulcers actually seem to be doing pretty well. Nonetheless he is having a  lot of drainage on the left at this time and again we did obtain  a wound culture did show positive for Staph aureus that was reviewed by myself today as well. Nonetheless he is on Bactrim which was shown to be sensitive that should be helping in this regard. Fortunately there is no signs of infection systemically at this point. 2/5; back in clinic today for a total contact cast change apparently secondary to very significant drainage. Still using drawtex 11/28/2019 upon evaluation today patient appears to be doing well with regard to his wounds. The right foot is doing okay as measured about the same in my opinion. The left foot is actually showing signs of significant improvement is measuring smaller there is a lot of hyper granulation likely due to the continued drainage at this point. We did obtain approval for a snap VAC I think that is good to be appropriate for him and will likely help this tremendously underneath the cast. He is definitely in agreement with proceeding with such. 2/12; patient came in today after his snap VAC lost suction. Brought in to see one of our nurses. The dressing was replaced and then we put the cast back on and rehooked up the snap VAC. Apparently his wound looked very good per our intake nurse. 2/15; again we replaced the cast on Friday. By Saturday the snap VAC and light suction. He called this morning he comes in acutely. The wounds look fine however the VAC is not functioning. We replaced the cast using silver alginate as the primary dressing backed with Kerramax. The snap VAC was not replaced 12/05/2019 upon evaluation today patient appears to be doing better in regard to his left plantar foot ulcer. Fortunately there is no signs of active infection at this time. Unfortunately he is continuing to have issues with the right foot he is really not making any progress here things seem to be somewhat stagnant to be honest. The depth has increased but that is due to me  having debrided the wound in the past based on what I am seeing. 12/12/2019 on evaluation today patient appears to be doing more poorly in regard to the left lower extremity. He has some erythema spreading up the side of his foot I am concerned about infection again at this point. Unfortunately he has been seeing improvement with a total contact cast but I do not think we should put that on today. On his right plantar foot he continues to have significant drainage this is actually measuring deeper I really do not feel like you are making any progress whatsoever. I have prescribed Granix for him unfortunately his insurance apparently was going to cost him a $500 co-pay. 12/19/2019 upon evaluation today patient actually appears to be doing better in regard to both wounds. With that being said he actually did get the reGranix which he had to pay $500 for. With that being said it does look like that he is actually made some improvement based on what I am seeing at this point with the reGranix. Obviously if he is going to continue this we are going to do something about trying to get him some help in covering the cost. 12/26/2019 on evaluation today patient appears to be doing really much better even compared to last week. Overall the wound seems to be much better even compared to last week and last week was better than the week before. Since has been using the reGranix his symptoms have improved significantly. With that being said the issue right now is simply that this is a very expensive medication  for him the first dose cost him $500. Upon inspection patient's wound bed actually is however dramatically improved compared to before he started this 2 weeks ago. 01/02/2020 upon evaluation today patient appears to actually be doing well. He still had a little bit of reGranix left that has been using in small amounts he just been applying it every other day instead of every day in order to make it go longer.  Overall we are still seeing excellent improvement he is measuring smaller looking better healthier tissue and everything seems to be pointing to this headed in the right direction. Fortunately there is no evidence of infection either which is also excellent news. He does have his MRI coming up within the next week. 01/09/2020 upon evaluation today patient appears to be doing a little worse today compared to previous week's evaluation. He is actually been out of the reGranix at this point. He has been trying to make the stretch out so he has been changing the dressings on a regular set schedule like he was previous. I think this has made a difference. Fortunately there is no signs of active infection at this time. No fevers, chills, nausea, vomiting, or diarrhea. 01/16/2020 upon evaluation today patient appears to be doing well with regard to his left plantar foot ulcer. The right plantar foot still shows some significant depth at this point. Fortunately there is no signs of active infection at this time. 01/30/2020 upon evaluation today patient appears to be doing about the same in regard to his right plantar foot ulcer there is still some depth here and we had to wait till he actually switched over to his new insurance to get approval for the MRI under his new insurance plan. With that being said he now has switched as of April 1. Fortunately there is no signs of active infection at this time. Overall in regard to his left foot ulcer this seems to be doing much better and I am actually very pleased with how things are going. With that being said it is not quite as much progress as we were seeing with the reGranix but at the same time he has had trouble getting this apparently there is been some hindrance here. I Ernie Hew try to actually send this to [facility] that was recommended by the drug rep to me. 02/13/20 upon evaluation today patient appears to be doing better in regard to his left foot ulcer  this is great news. Unfortunately the right foot ulcer is not really significantly better at this time. There is no signs of active infection systemically though he did have his MRI which showed unfortunately he does have infection noted including an abscess in the foot. There is also marrow changes noted which are consistent with osteomyelitis based on the radiology review and interpretation. Unfortunately considering that the wound is not really making the progress that we will he would like to be seen I think that this is an indication that he may need some further referral both infectious disease as well as potentially to podiatrist to see if there is anything that can be done to help with the situation that were dealing with here. The left foot again is doing great. READMISSION 06/04/2021 This is a 61 year old man who was in the clinic in 2021 followed by Allen Derry for areas on the right and left foot. He developed a left foot infection and was referred to ID. He left the clinic in a nonhealed state and was followed  for a period of [facility] Marylene Land. Apparently things really deteriorated in early July when he was admitted to hospital from 04/27/2021 through 05/06/2021 with sepsis secondary to a left foot infection. His blood cultures were negative. An MRI suggested fifth metatarsal osteomyelitis a left ankle septic joint. He was treated with vancomycin and ertapenem which he is still taking and may just about be finishing. He was seen by orthopedics and the patient adamantly refused to BKA. As far as I can tell he did not have the ankle aspirated I am not exactly sure what the issue was here. Since he has been discharged she is at [facility] health care for rehabilitation. He was last seen by Dr. Algis Liming on 05/20/2021 he noted osteo of the tibial talar bone cuboid and fifth metatarsal which is even more extensive than what was suggested by the MRI. He is apparently going for a  consultation with orthopedic surgery in [place] sometime next week. I received a call about this man 2 weeks ago from Dr. Allyson Sabal who follows him for the possibility of PAD. ABIs I think done in the office showed a ABI on the right of 1.05 at the PTA and 0.99 at the PTA on the left. He had a DVT rule out in the left leg that was negative for the DVT. Past medical history is extensive and includes diastolic heart failure, right first metatarsal head ulcer in 2021, excision of the right second ray by Dr. Marylene Land on 03/13/2020, hypertension, hypothyroidism. Left total hip replacement, right total knee replacement, carpal tunnel syndrome, obstructive sleep apnea alcohol abuse with cirrhosis although the patient denies current alcohol intake. The patient does not think he is a diabetic however looking through [place] link I see 2 HgbAic's of this year that were greater than equal to 6.5 which by definition makes him diabetic. Nevertheless he is not on any treatment and does not check his blood sugars. The patient is now back home out of the nursing home. Saw Dr. Algis Liming last week he was taken off vancomycin and ertapenem on August 22 and now is on doxycycline on Augmentin. He also saw Dr. Weston Anna who is his orthopedic surgeon in [place] he recommended a [employer]	[place]. He has been using Medihoney. 9/6I have been having trouble getting hyperbarics approved through our prior authorization process. Even though he had a limb threatening infection in the left foot and probably the left ankle there glitches in how some of the reports are worded also some of the consultants. In any case I am going to repeat his sedimentation rate and C-reactive protein. I am generally not in favor of doing things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

## 2022-06-11 NOTE — Progress Notes (Signed)
[name], [name] ([id number]) Visit Report for 06/11/2022 Arrival Information Details Patient Name: Date of Service: [name], [name] 06/11/2022 9:30 A M Medical Record Number: [id number] Patient Account Number: [id number] Date of Birth/Sex: Treating RN: [birth year] (61 y.o. [name] Primary Care [name]: [name] Other Clinician: Referring [name]: Treating [name]/Extender: [name] in Treatment: 5 Visit Information History Since Last Visit Added or deleted any medications: No Patient Arrived: [name] Any new allergies or adverse reactions: No Arrival Time: 09:30 Had a fall or experienced change in No Accompanied By: self activities of daily living that may affect Transfer Assistance: None risk of falls: Patient Identification Verified: Yes Signs or symptoms of abuse/neglect since last visito No Secondary Verification Process Completed: Yes Hospitalized since last visit: No Patient Requires Transmission-Based Precautions: No Implantable device outside of the clinic excluding No Patient Has Alerts: No cellular tissue based products placed in the center since last visit: Has Dressing in Place as Prescribed: Yes Has Footwear/Offloading in Place as Prescribed: Yes Left: Other:defender boot Pain Present Now: No Electronic Signature(s) Signed: 06/11/2022 5:57:00 PM By: [name] Stall RN, BSN Entered By: [name] on 06/11/2022 09:35:23 -------------------------------------------------------------------------------- Encounter Discharge Information Details Patient Name: Date of Service: [name] 06/11/2022 9:30 A M Medical Record Number: [id number] Patient Account Number: [id number] Date of Birth/Sex: Treating RN: Oct 31, 1960 (60 y.o. [name] Primary Care [name]: [name] Other Clinician: Referring [name]: Treating [name]: [name] in Treatment: 5 Encounter Discharge Information Items  Post Procedure Vitals Discharge Condition: Stable Temperature (F): 97.8 Ambulatory Status: Walker Pulse (bpm): 93 Discharge Destination: Home Respiratory Rate (breaths/min): 20 Transportation: Private Auto Blood Pressure (mmHg): 134/72 Accompanied By: self Schedule Follow-up Appointment: Yes Clinical Summary of Care: Electronic Signature(s) Signed: 06/11/2022 5:57:00 PM By: [name] Stall RN, BSN Entered By: [name] on 06/11/2022 10:01:17 -------------------------------------------------------------------------------- Lower Extremity Assessment Details Patient Name: Date of Service: [name], [name] [name] 06/11/2022 9:30 A M Medical Record Number: [id number] Patient Account Number: [id number] Date of Birth/Sex: Treating RN: 1960/11/17 (60 y.o. [name] Primary Care [name]: [name] Other Clinician: Referring [name]: Treating Romaldo [name]/Extender: [name] Weeks in Treatment: 5 Edema Assessment Assessed: [Left: Yes] [Right: No] Edema: [Left: N] [Right: o] Calf Left: Right: Point of Measurement: 37 cm From Medial Instep 34 cm Ankle Left: Right: Point of Measurement: 11 cm From Medial Instep 22 cm Vascular Assessment Pulses: Dorsalis Pedis Palpable: [Left:Yes] Electronic Signature(s) Signed: 06/11/2022 5:57:00 PM By: [name] Stall RN, BSN Entered By: [name] on 06/11/2022 09:37:57 -------------------------------------------------------------------------------- Multi Wound Chart Details Patient Name: Date of Service: [name] 06/11/2022 9:30 A M Medical Record Number: [id number] Patient Account Number: [id number] Date of Birth/Sex: Treating RN: 06/11/1961 (60 y.o. [name] Primary Care Zenora Karpel: [name] Other Clinician: Referring [name]: Treating [name]/Extender: [name] Weeks in Treatment: 5 Vital Signs Height(in): 73 Pulse(bpm): 93 Weight(lbs): 237 Blood Pressure(mmHg):  134/72 Body Mass Index(BMI): 31.3 Temperature(F): 97.8 Respiratory Rate(breaths/min): 20 Photos: [5:Left, Plantar Foot] [N/A:N/A N/A] Wound Location: [5:Gradually Appeared] [N/A:N/A] Wounding Event: [5:Diabetic Wound/Ulcer of the Lower] [N/A:N/A] Primary Etiology: [5:Extremity Anemia, Sleep Apnea, Congestive] [N/A:N/A] Comorbid History: [5:Heart Failure, Hypertension, Peripheral Venous Disease, Cirrhosis , Type II Diabetes, Osteoarthritis, Osteomyelitis, Neuropathy 10/19/2019] [N/A:N/A] Date Acquired: [5:5] [N/A:N/A] Weeks of Treatment: [5:Open] [N/A:N/A] Wound Status: [5:No] [N/A:N/A] Wound Recurrence: [5:2.5x2.9x0.4] [N/A:N/A] Measurements L x W x D (cm) [5:5.694] [N/A:N/A] A (cm) : rea [5:2.278] [N/A:N/A] Volume (cm) : [5:-37.00%] [N/A:N/A] % Reduction in A [5:rea: -448.90%] [N/A:N/A] %  Reduction in Volume: [5:8] Starting Position 1 (o'clock): [5:11] Ending Position 1 (o'clock): [5:1] Maximum Distance 1 (cm): [5:Yes] [N/A:N/A] Undermining: [5:Grade 2] [N/A:N/A] Classification: [5:Medium] [N/A:N/A] Exudate A mount: [5:Serosanguineous] [N/A:N/A] Exudate Type: [5:red, brown] [N/A:N/A] Exudate Color: [5:Distinct, outline attached] [N/A:N/A] Wound Margin: [5:Medium (34-66%)] [N/A:N/A] Granulation A mount: [5:Pink, Pale, Hyper-granulation] [N/A:N/A] Granulation Quality: [5:Medium (34-66%)] [N/A:N/A] Necrotic A mount: [5:Fat Layer (Subcutaneous Tissue): Yes N/A] Exposed Structures: [5:Fascia: No Tendon: No Muscle: No Joint: No Bone: No Small (1-33%)] [N/A:N/A] Epithelialization: [5:Debridement - Excisional] [N/A:N/A] Debridement: Pre-procedure Verification/Time Out 09:50 [N/A:N/A] Taken: [5:Lidocaine 5% topical ointment] [N/A:N/A] Pain Control: [5:Callus, Subcutaneous, Slough] [N/A:N/A] Tissue Debrided: [5:Skin/Subcutaneous Tissue] [N/A:N/A] Level: [5:9] [N/A:N/A] Debridement A (sq cm): [5:rea Curette] [N/A:N/A] Instrument: [5:Minimum] [N/A:N/A] Bleeding: [5:Silver Nitrate]  [N/A:N/A] Hemostasis A chieved: [5:0] [N/A:N/A] Procedural Pain: [5:0] [N/A:N/A] Post Procedural Pain: [5:Procedure was tolerated well] [N/A:N/A] Debridement Treatment Response: [5:2.5x2.9x0.4] [N/A:N/A] Post Debridement Measurements L x W x D (cm) [5:2.278] [N/A:N/A] Post Debridement Volume: (cm) [5:callous and maceration present to] [N/A:N/A] Assessment Notes: [5:periwound. Debridement] [N/A:N/A] Treatment Notes Wound #5 (Foot) Wound Laterality: Plantar, Left Cleanser Wound Cleanser Discharge Instruction: Cleanse the wound with wound cleanser prior to applying a clean dressing using gauze sponges, not tissue or cotton balls. Peri-Wound Care Topical Primary Dressing KerraCel Ag Gelling Fiber Dressing, 2x2 in (silver alginate) Discharge Instruction: Apply silver alginate to wound bed as instructed Secondary Dressing ABD Pad, 5x9 Discharge Instruction: Apply over primary dressing as directed. Secured With Elastic Bandage 4 inch (ACE bandage) Discharge Instruction: Secure with ACE bandage. Kerlix Roll Sterile, 4.5x3.1 (in/yd) Discharge Instruction: Secure with Kerlix as directed. Paper Tape, 2x10 (in/yd) Discharge Instruction: Secure dressing with tape as directed. Compression Wrap Compression Stockings Add-Ons Electronic Signature(s) Signed: 06/11/2022 10:24:04 AM By: [name] DO Signed: 06/11/2022 5:57:00 PM By: [name] Stall RN, BSN Entered By: [name] on 06/11/2022 10:15:45 -------------------------------------------------------------------------------- Multi-Disciplinary Care Plan Details Patient Name: Date of Service: [name], [name] [name] 06/11/2022 9:30 A M Medical Record Number: [id number] Patient Account Number: [id number] Date of Birth/Sex: Treating RN: 28-Mar-1961 (60 y.o. [name] Primary Care Lennard Capek: [name] Other Clinician: Referring Nasha Diss: Treating Panhia Karl/Extender: [name] in Treatment: 5 Active  Inactive Necrotic Tissue Nursing Diagnoses: Impaired tissue integrity related to necrotic/devitalized tissue Goals: Necrotic/devitalized tissue will be minimized in the wound bed Date Initiated: 06/11/2022 Target Resolution Date: 06/25/2022 Goal Status: Active Patient/caregiver will verbalize understanding of reason and process for debridement of necrotic tissue Date Initiated: 06/11/2022 Target Resolution Date: 06/25/2022 Goal Status: Active Interventions: Assess patient pain level pre-, during and post procedure and prior to discharge Treatment Activities: Apply topical anesthetic as ordered : 06/11/2022 Excisional debridement : 06/11/2022 Notes: Electronic Signature(s) Signed: 06/11/2022 5:57:00 PM By: [name] Stall RN, BSN Entered By: [name] on 06/11/2022 09:42:48 -------------------------------------------------------------------------------- Pain Assessment Details Patient Name: Date of Service: [name] 06/11/2022 9:30 A M Medical Record Number: [id number] Patient Account Number: [id number] Date of Birth/Sex: Treating RN: 1960-12-02 (60 y.o. [name] Primary Care Susannah Carbin: [name] Other Clinician: Referring Karena Kinker: Treating Lundyn Coste/Extender: [name] Weeks in Treatment: 5 Active Problems Location of Pain Severity and Description of Pain Patient Has Paino No Site Locations Rate the pain. Current Pain Level: 0 Pain Management and Medication Current Pain Management: Medication: No Cold Application: No Rest: No Massage: No Activity: No T.E.N.S.: No Heat Application: No Leg drop or elevation: No Is the Current Pain Management Adequate: Adequate How does your wound impact your activities of daily livingo Sleep: No  Bathing: No Appetite: No Relationship With Others: No Bladder Continence: No Emotions: No Bowel Continence: No Work: No Toileting: No Drive: No Dressing: No Hobbies: No Psychologist, prison and probation services) Signed:  06/11/2022 5:57:00 PM By: [name] Stall RN, BSN Entered By: [name] on 06/11/2022 09:35:42 -------------------------------------------------------------------------------- Patient/Caregiver Education Details Patient Name: Date of Service: [name] 8/25/2023andnbsp9:30 A M Medical Record Number: [id number] Patient Account Number: [id number] Date of Birth/Gender: Treating RN: 1960/12/30 (61 y.o. [name] Primary Care Physician: [name] Other Clinician: Referring Physician: Treating Physician/Extender: [name] in Treatment: 5 Education Assessment Education Provided To: Patient Education Topics Provided Wound/Skin Impairment: Handouts: Caring for Your Ulcer Methods: Explain/Verbal Responses: Reinforcements needed Electronic Signature(s) Signed: 06/11/2022 5:57:00 PM By: [name] Stall RN, BSN Entered By: [name] on 06/11/2022 09:43:01 -------------------------------------------------------------------------------- Wound Assessment Details Patient Name: Date of Service: [name] 06/11/2022 9:30 A M Medical Record Number: [id number] Patient Account Number: [id number] Date of Birth/Sex: Treating RN: November 21, 1960 (60 y.o. [name] Primary Care Rosa Wyly: [name] Other Clinician: Referring Rilyn Scroggs: Treating Tallia Moehring/Extender: [name] Weeks in Treatment: 5 Wound Status Wound Number: 5 Primary Diabetic Wound/Ulcer of the Lower Extremity Etiology: Wound Location: Left, Plantar Foot Wound Open Wounding Event: Gradually Appeared Status: Date Acquired: 10/19/2019 Comorbid Anemia, Sleep Apnea, Congestive Heart Failure, Hypertension, Weeks Of Treatment: 5 History: Peripheral Venous Disease, Cirrhosis , Type II Diabetes, Clustered Wound: No Osteoarthritis, Osteomyelitis, Neuropathy Photos Wound Measurements Length: (cm) 2.5 Width: (cm) 2.9 Depth: (cm) 0.4 Area: (cm) 5.694 Volume: (cm)  2.278 % Reduction in Area: -37% % Reduction in Volume: -448.9% Epithelialization: Small (1-33%) Tunneling: No Undermining: Yes Starting Position (o'clock): 8 Ending Position (o'clock): 11 Maximum Distance: (cm) 1 Wound Description Classification: Grade 2 Wound Margin: Distinct, outline attached Exudate Amount: Medium Exudate Type: Serosanguineous Exudate Color: red, brown Foul Odor After Cleansing: No Slough/Fibrino Yes Wound Bed Granulation Amount: Medium (34-66%) Exposed Structure Granulation Quality: Pink, Pale, Hyper-granulation Fascia Exposed: No Necrotic Amount: Medium (34-66%) Fat Layer (Subcutaneous Tissue) Exposed: Yes Necrotic Quality: Adherent Slough Tendon Exposed: No Muscle Exposed: No Joint Exposed: No Bone Exposed: No Assessment Notes callous and maceration present to periwound. Treatment Notes Wound #5 (Foot) Wound Laterality: Plantar, Left Cleanser Wound Cleanser Discharge Instruction: Cleanse the wound with wound cleanser prior to applying a clean dressing using gauze sponges, not tissue or cotton balls. Peri-Wound Care Topical Primary Dressing KerraCel Ag Gelling Fiber Dressing, 2x2 in (silver alginate) Discharge Instruction: Apply silver alginate to wound bed as instructed Secondary Dressing ABD Pad, 5x9 Discharge Instruction: Apply over primary dressing as directed. Secured With Elastic Bandage 4 inch (ACE bandage) Discharge Instruction: Secure with ACE bandage. Kerlix Roll Sterile, 4.5x3.1 (in/yd) Discharge Instruction: Secure with Kerlix as directed. Paper Tape, 2x10 (in/yd) Discharge Instruction: Secure dressing with tape as directed. Compression Wrap Compression Stockings Add-Ons Electronic Signature(s) Signed: 06/11/2022 5:57:00 PM By: [name] Stall RN, BSN Entered By: [name] on 06/11/2022 09:41:42 -------------------------------------------------------------------------------- Vitals Details Patient Name: Date of  Service: [name] 06/11/2022 9:30 A M Medical Record Number: [id number] Patient Account Number: [id number] Date of Birth/Sex: Treating RN: 12/03/1960 (60 y.o. [name] Primary Care Jovante Hammitt: [name] Other Clinician: Referring Melanie Openshaw: Treating Haani Bakula/Extender: [name] Weeks in Treatment: 5 Vital Signs Time Taken: 09:30 Temperature (F): 97.8 Height (in): 73 Pulse (bpm): 93 Weight (lbs): 237 Respiratory Rate (breaths/min): 20 Body Mass Index (BMI): 31.3 Blood Pressure (mmHg): 134/72 Reference Range: 80 - 120 mg / dl Electronic Signature(s) Signed: 06/11/2022 5:57:00  PM By: [name] Stall RN, BSN Entered By: [name] on 06/11/2022 09:35:34

## 2022-06-15 ENCOUNTER — Other Ambulatory Visit: Payer: Self-pay | Admitting: Internal Medicine

## 2022-06-17 ENCOUNTER — Other Ambulatory Visit: Payer: Self-pay | Admitting: Internal Medicine

## 2022-06-21 ENCOUNTER — Encounter: Payer: Self-pay | Admitting: Internal Medicine

## 2022-06-21 NOTE — Patient Instructions (Addendum)
    You can try flonase nasal spray to see if that helps with the runny nose when eating.    Blood work was ordered.     Medications changes include :  you can take lasix and potassium daily or daily as needed      Return in about 6 months (around 12/21/2022) for follow up.

## 2022-06-21 NOTE — Progress Notes (Signed)
Subjective:    Patient ID: [name], male    DOB: May 11, 1961, 61 y.o.   MRN: [id number]     HPI Detrell is here for follow up of his chronic medical problems, including DM, HFpEF,  hypothyroid, liver cirrhosis due to etoh, thrombocytopenia, etoh abuse, hld, depression  Gets a runny nose with eating.    Hands - tingling and burning at night.  Using blue emu and compression gloves - help.    Has not drank any alcohol since May.   He takes campral.  He denies depression or anxiety.  He is taking sertraline daily.   Taking lasix 40 mg in am only.     Ears clogged.    Medications and allergies reviewed with patient and updated if appropriate.  Current Outpatient Medications on File Prior to Visit  Medication Sig Dispense Refill   acamprosate (CAMPRAL) 333 MG tablet Take 333 mg by mouth 2 (two) times daily.     amLODipine (NORVASC) 5 MG tablet Take 5 mg by mouth daily.     atorvastatin (LIPITOR) 80 MG tablet Take 1 tablet (80 mg total) by mouth daily. 90 tablet 1   folic acid (FOLVITE) 1 MG tablet TAKE 1 TABLET BY MOUTH EVERY DAY 90 tablet 0   furosemide (LASIX) 40 MG tablet Take 1 tablet (40 mg total) by mouth daily.     gabapentin (NEURONTIN) 300 MG capsule Take 300 mg by mouth 3 (three) times daily.     ibuprofen (ADVIL) 200 MG tablet Take 200 mg by mouth 2 (two) times daily as needed. 2 tabs in am and 2 tabs at hs     losartan (COZAAR) 25 MG tablet TAKE 1 TABLET (25 MG TOTAL) BY MOUTH DAILY. 90 tablet 1   nitroGLYCERIN (NITROSTAT) 0.4 MG SL tablet Place 1 tablet (0.4 mg total) under the tongue every 5 (five) minutes x 3 doses as needed for chest pain. 15 tablet 1   pantoprazole (PROTONIX) 40 MG tablet Take 40 mg by mouth daily as needed.     potassium chloride SA (KLOR-CON M) 20 MEQ tablet Take 20 mEq by mouth 2 (two) times daily.     saccharomyces boulardii (FLORASTOR) 250 MG capsule Take 250 mg by mouth daily.     sertraline (ZOLOFT) 50 MG tablet TAKE 1 TABLET BY MOUTH  EVERY DAY 90 tablet 1   No current facility-administered medications on file prior to visit.     Review of Systems  Constitutional:  Negative for appetite change, chills and fever.  Respiratory:  Negative for cough, shortness of breath and wheezing.   Cardiovascular:  Negative for chest pain, palpitations and leg swelling.  Gastrointestinal:  Negative for abdominal pain and nausea.       No gerd  Neurological:  Positive for numbness (feet, hands). Negative for light-headedness and headaches.       Objective:   Vitals:   06/22/22 1333  BP: 138/80  Pulse: 98  Temp: 97.9 F (36.6 C)  SpO2: 99%   BP Readings from Last 3 Encounters:  06/22/22 138/80  06/02/22 137/85  04/15/22 121/83   Wt Readings from Last 3 Encounters:  06/22/22 250 lb (113.4 kg)  06/02/22 246 lb (111.6 kg)  04/29/22 235 lb (106.6 kg)   Body mass index is 32.98 kg/m.    Physical Exam Constitutional:      General: He is not in acute distress.    Appearance: Normal appearance. He is not ill-appearing.  HENT:  Head: Normocephalic and atraumatic.     Right Ear: Ear canal and external ear normal. There is impacted cerumen.     Left Ear: Ear canal and external ear normal. There is impacted cerumen.  Eyes:     Conjunctiva/sclera: Conjunctivae normal.  Cardiovascular:     Rate and Rhythm: Normal rate and regular rhythm.     Heart sounds: Normal heart sounds. No murmur heard. Pulmonary:     Effort: Pulmonary effort is normal. No respiratory distress.     Breath sounds: Normal breath sounds. No wheezing or rales.  Musculoskeletal:     Right lower leg: No edema.     Left lower leg: No edema.  Skin:    General: Skin is warm and dry.     Findings: No rash.  Neurological:     Mental Status: He is alert. Mental status is at baseline.  Psychiatric:        Mood and Affect: Mood normal.        Lab Results  Component Value Date   WBC 4.8 04/15/2022   HGB 13.3 04/15/2022   HCT 40.8 04/15/2022    PLT 252 04/15/2022   GLUCOSE 98 04/15/2022   CHOL 166 11/15/2021   TRIG 72 11/15/2021   HDL 83 11/15/2021   LDLCALC 69 11/15/2021   ALT 17 03/10/2022   AST 26 03/10/2022   NA 138 04/15/2022   K 3.7 04/15/2022   CL 106 04/15/2022   CREATININE 0.66 (L) 04/15/2022   BUN 10 04/15/2022   CO2 20 04/15/2022   TSH 0.787 04/28/2021   PSA 0.21 02/01/2018   INR 1.2 09/02/2021   HGBA1C 5.0 03/02/2022     Assessment & Plan:    See Problem List for Assessment and Plan of chronic medical problems.

## 2022-06-22 ENCOUNTER — Ambulatory Visit (INDEPENDENT_AMBULATORY_CARE_PROVIDER_SITE_OTHER): Payer: Medicare Other | Admitting: Internal Medicine

## 2022-06-22 ENCOUNTER — Other Ambulatory Visit: Payer: Self-pay | Admitting: Infectious Disease

## 2022-06-22 VITALS — BP 138/80 | HR 98 | Temp 97.9°F | Ht 73.0 in | Wt 250.0 lb

## 2022-06-22 DIAGNOSIS — G621 Alcoholic polyneuropathy: Secondary | ICD-10-CM | POA: Diagnosis not present

## 2022-06-22 DIAGNOSIS — H6123 Impacted cerumen, bilateral: Secondary | ICD-10-CM

## 2022-06-22 DIAGNOSIS — K7030 Alcoholic cirrhosis of liver without ascites: Secondary | ICD-10-CM

## 2022-06-22 DIAGNOSIS — E039 Hypothyroidism, unspecified: Secondary | ICD-10-CM | POA: Diagnosis not present

## 2022-06-22 DIAGNOSIS — L97429 Non-pressure chronic ulcer of left heel and midfoot with unspecified severity: Secondary | ICD-10-CM

## 2022-06-22 DIAGNOSIS — F3289 Other specified depressive episodes: Secondary | ICD-10-CM

## 2022-06-22 DIAGNOSIS — E782 Mixed hyperlipidemia: Secondary | ICD-10-CM

## 2022-06-22 DIAGNOSIS — L97509 Non-pressure chronic ulcer of other part of unspecified foot with unspecified severity: Secondary | ICD-10-CM

## 2022-06-22 DIAGNOSIS — E11621 Type 2 diabetes mellitus with foot ulcer: Secondary | ICD-10-CM

## 2022-06-22 DIAGNOSIS — I10 Essential (primary) hypertension: Secondary | ICD-10-CM

## 2022-06-22 LAB — LIPID PANEL
Cholesterol: 118 mg/dL (ref 0–200)
HDL: 35.6 mg/dL — ABNORMAL LOW (ref >39.00)
LDL Cholesterol: 72 mg/dL (ref 0–99)
NonHDL: 81.94
Total CHOL/HDL Ratio: 3
Triglycerides: 51 mg/dL (ref 0.0–149.0)
VLDL: 10.2 mg/dL (ref 0.0–40.0)

## 2022-06-22 LAB — CBC WITH DIFFERENTIAL/PLATELET
Basophils Absolute: 0 10*3/uL (ref 0.0–0.1)
Basophils Relative: 0.6 % (ref 0.0–3.0)
Eosinophils Absolute: 0.1 10*3/uL (ref 0.0–0.7)
Eosinophils Relative: 1.3 % (ref 0.0–5.0)
HCT: 36.1 % — ABNORMAL LOW (ref 39.0–52.0)
Hemoglobin: 12.2 g/dL — ABNORMAL LOW (ref 13.0–17.0)
Lymphocytes Relative: 26 % (ref 12.0–46.0)
Lymphs Abs: 1.4 10*3/uL (ref 0.7–4.0)
MCHC: 33.9 g/dL (ref 30.0–36.0)
MCV: 85 fl (ref 78.0–100.0)
Monocytes Absolute: 0.5 10*3/uL (ref 0.1–1.0)
Monocytes Relative: 8.6 % (ref 3.0–12.0)
Neutro Abs: 3.5 10*3/uL (ref 1.4–7.7)
Neutrophils Relative %: 63.5 % (ref 43.0–77.0)
Platelets: 220 10*3/uL (ref 150.0–400.0)
RBC: 4.24 Mil/uL (ref 4.22–5.81)
RDW: 15.4 % (ref 11.5–15.5)
WBC: 5.5 10*3/uL (ref 4.0–10.5)

## 2022-06-22 LAB — VITAMIN B12: Vitamin B-12: 557 pg/mL (ref 211–911)

## 2022-06-22 LAB — COMPREHENSIVE METABOLIC PANEL
ALT: 27 U/L (ref 0–53)
AST: 22 U/L (ref 0–37)
Albumin: 3.9 g/dL (ref 3.5–5.2)
Alkaline Phosphatase: 198 U/L — ABNORMAL HIGH (ref 39–117)
BUN: 17 mg/dL (ref 6–23)
CO2: 24 mEq/L (ref 19–32)
Calcium: 10 mg/dL (ref 8.4–10.5)
Chloride: 107 mEq/L (ref 96–112)
Creatinine, Ser: 0.68 mg/dL (ref 0.40–1.50)
GFR: 100.74 mL/min (ref >60.00)
Glucose, Bld: 97 mg/dL (ref 70–99)
Potassium: 4.2 mEq/L (ref 3.5–5.1)
Sodium: 141 mEq/L (ref 135–145)
Total Bilirubin: 0.8 mg/dL (ref 0.2–1.2)
Total Protein: 8.1 g/dL (ref 6.0–8.3)

## 2022-06-22 LAB — MICROALBUMIN / CREATININE URINE RATIO
Creatinine,U: 31.6 mg/dL
Microalb Creat Ratio: 2.2 mg/g (ref 0.0–30.0)
Microalb, Ur: <0.7 mg/dL (ref 0.0–1.9)

## 2022-06-22 LAB — TSH: TSH: 3.01 u[IU]/mL (ref 0.35–5.50)

## 2022-06-22 LAB — HEMOGLOBIN A1C: Hgb A1c MFr Bld: 5.8 % (ref 4.6–6.5)

## 2022-06-22 MED ORDER — MAGNESIUM 400 MG PO TABS: 400.0000 mg | ORAL_TABLET | Freq: Every day | ORAL | 1 refills | Status: DC

## 2022-06-22 MED ORDER — VITAMIN B-12 100 MCG PO TABS
ORAL_TABLET | ORAL | 1 refills | Status: DC
Start: 2022-06-22 — End: 2022-12-20

## 2022-06-22 MED ORDER — THIAMINE HCL 100 MG PO TABS: 100.0000 mg | ORAL_TABLET | Freq: Every day | ORAL | 1 refills | Status: DC

## 2022-06-22 MED ORDER — METOPROLOL SUCCINATE ER 25 MG PO TB24
ORAL_TABLET | ORAL | 1 refills | Status: DC
Start: 2022-06-22 — End: 2023-04-18

## 2022-06-22 NOTE — Assessment & Plan Note (Signed)
Chronic Blood pressure well controlled CMP Continue amlodipine 5 mg daily, losartan 25 mg daily, metoprolol XL 25 mg daily 

## 2022-06-22 NOTE — Assessment & Plan Note (Signed)
Chronic Diet controlled Last A1c was in the normal range He has not drank since May and states he is eating healthy Check A1c, urine microalbumin Diabetic diet, regular exercise encouraged He has lost a lot of weight and is trying to keep it off

## 2022-06-22 NOTE — Assessment & Plan Note (Signed)
Chronic No longer drinking On gabapentin 300 mg 3 times daily-whether he continues this or not is dependent on his symptoms-can try slowly decreasing the dose to see if he really needs it

## 2022-06-22 NOTE — Assessment & Plan Note (Signed)
Chronic He has not drank any alcohol since May Last liver test normal CMP Continue alcohol cessation

## 2022-06-22 NOTE — Assessment & Plan Note (Signed)
Chronic Regular exercise and healthy diet encouraged Check lipid panel  Continue atorvastatin 80 mg daily 

## 2022-06-22 NOTE — Assessment & Plan Note (Signed)
New Feels his ears are clogged and has decreased hearing CMA successfully removed wax-moderate amount from both ear canals Symptoms improved Tolerated procedure well

## 2022-06-22 NOTE — Progress Notes (Signed)
PRE-PROCEDURE EXAM: Bilateral TM's cannot be visualized due to total occlusion/impaction of the ear canal.  PROCEDURE INDICATION: remove wax to visualize ear drum & relieve discomfort  CONSENT:  Verbal    PROCEDURE NOTE:   RIGHT EAR:  The CMA irrigated the ear canal with warm water to remove the wax.   LEFT EAR:  The CMA irrigated the ear canal with warm water to remove the wax.   POST- PROCEDURE EXAM: B/l TMs successfully visualized and found to have no erythema.  Right ear canal with scant remaining cerumen.  Left ear canal with no remaining cerumen.    Patient tolerated the procedure well and had relief of their symptoms after the procedure.

## 2022-06-22 NOTE — Assessment & Plan Note (Signed)
Chronic Was on medication for a while, but has been off and his thyroid function has been in the normal range We will check TSH and monitor For now continue without medication

## 2022-06-22 NOTE — Assessment & Plan Note (Signed)
Chronic Likely related to alcoholic peripheral neuropathy Following closely with podiatry

## 2022-06-22 NOTE — Assessment & Plan Note (Signed)
Chronic Controlled, Stable Continue sertraline 50 mg daily 

## 2022-06-24 ENCOUNTER — Ambulatory Visit (INDEPENDENT_AMBULATORY_CARE_PROVIDER_SITE_OTHER): Payer: Medicare Other | Admitting: Podiatry

## 2022-06-24 ENCOUNTER — Telehealth: Payer: Self-pay | Admitting: Internal Medicine

## 2022-06-24 DIAGNOSIS — L97522 Non-pressure chronic ulcer of other part of left foot with fat layer exposed: Secondary | ICD-10-CM

## 2022-06-24 NOTE — Telephone Encounter (Signed)
Okay for orders? 

## 2022-06-24 NOTE — Telephone Encounter (Signed)
Verbals given to Kelly today. 
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
## 2022-06-24 NOTE — Telephone Encounter (Signed)
Verbal orders to extend home physical therapy - once a week x 9 weeks

## 2022-06-25 ENCOUNTER — Encounter: Payer: Self-pay | Admitting: Internal Medicine

## 2022-06-27 MED ORDER — FUROSEMIDE 40 MG PO TABS: 40.0000 mg | ORAL_TABLET | Freq: Every day | ORAL | 1 refills | Status: DC | PRN

## 2022-07-01 ENCOUNTER — Telehealth: Payer: Self-pay | Admitting: Podiatry

## 2022-07-01 NOTE — Telephone Encounter (Signed)
Pt called stating he needed to get in before his appt on  10.11 for debridement of his wound. I offered him 9.27 but he will be out of town. He stated he was going to call the wound care center to see if they could get him in.

## 2022-07-08 ENCOUNTER — Encounter (HOSPITAL_BASED_OUTPATIENT_CLINIC_OR_DEPARTMENT_OTHER): Payer: Medicare Other | Attending: Internal Medicine | Admitting: Internal Medicine

## 2022-07-08 DIAGNOSIS — E039 Hypothyroidism, unspecified: Secondary | ICD-10-CM | POA: Diagnosis not present

## 2022-07-08 DIAGNOSIS — I5032 Chronic diastolic (congestive) heart failure: Secondary | ICD-10-CM | POA: Insufficient documentation

## 2022-07-08 DIAGNOSIS — I872 Venous insufficiency (chronic) (peripheral): Secondary | ICD-10-CM | POA: Diagnosis not present

## 2022-07-08 DIAGNOSIS — I110 Hypertensive heart disease with heart failure: Secondary | ICD-10-CM | POA: Insufficient documentation

## 2022-07-08 DIAGNOSIS — L97522 Non-pressure chronic ulcer of other part of left foot with fat layer exposed: Secondary | ICD-10-CM | POA: Diagnosis not present

## 2022-07-08 DIAGNOSIS — E11621 Type 2 diabetes mellitus with foot ulcer: Secondary | ICD-10-CM | POA: Insufficient documentation

## 2022-07-08 DIAGNOSIS — G4733 Obstructive sleep apnea (adult) (pediatric): Secondary | ICD-10-CM | POA: Diagnosis not present

## 2022-07-08 DIAGNOSIS — M86372 Chronic multifocal osteomyelitis, left ankle and foot: Secondary | ICD-10-CM | POA: Diagnosis not present

## 2022-07-08 DIAGNOSIS — E1140 Type 2 diabetes mellitus with diabetic neuropathy, unspecified: Secondary | ICD-10-CM | POA: Diagnosis not present

## 2022-07-09 NOTE — Progress Notes (Signed)
[name], [name] ([id number]) Visit Report for 07/08/2022 Arrival Information Details Patient Name: Date of Service: [name], [name] 07/08/2022 2:15 PM Medical Record Number: [id number] Patient Account Number: [id number] Date of Birth/Sex: Treating [name]: 11/27/1960 (60 y.o. [name]) [name], [name] Primary Care Shandie Bertz: Cheryll CockayneBurns, Stacy Other Clinician: Referring Samani Deal: Treating [name]/Extender: Heidi DachHoffman, Jessica Burns, Stacy Weeks in Treatment: 9 Visit Information History Since Last Visit Added or deleted any medications: No Patient Arrived: Ambulatory Any new allergies or adverse reactions: No Arrival Time: 14:20 Had a fall or experienced change in No Accompanied By: self activities of daily living that may affect Transfer Assistance: None risk of falls: Patient Identification Verified: Yes Signs or symptoms of abuse/neglect since last visito No Secondary Verification Process Completed: Yes Hospitalized since last visit: No Patient Requires Transmission-Based Precautions: No Implantable device outside of the clinic excluding No Patient Has Alerts: No cellular tissue based products placed in the center since last visit: Has Dressing in Place as Prescribed: Yes Has Footwear/Offloading in Place as Prescribed: Yes Left: Other:foot defender Pain Present Now: No Electronic Signature(s) Signed: 07/08/2022 5:58:31 PM By: [name], [name], BSN Entered By: [name], [name] on 07/08/2022 14:26:53 -------------------------------------------------------------------------------- Encounter Discharge Information Details Patient Name: Date of Service: [name], [name] 07/08/2022 2:15 PM Medical Record Number: [id number] Patient Account Number: [id number] Date of Birth/Sex: Treating [name]: 01/06/1961 (60 y.o. [name]) [name], [name] Primary Care Endya Austin: Cheryll CockayneBurns, Stacy Other Clinician: Referring Monterio Bob: Treating [name]/Extender: Heidi DachHoffman, Jessica Burns, Stacy Weeks in Treatment: 9 Encounter Discharge Information  Items Post Procedure Vitals Discharge Condition: Stable Temperature (F): 98.3 Ambulatory Status: Cane Pulse (bpm): 81 Discharge Destination: Home Respiratory Rate (breaths/min): 20 Transportation: Private Auto Blood Pressure (mmHg): 129/88 Accompanied By: self Schedule Follow-up Appointment: Yes Clinical Summary of Care: Electronic Signature(s) Signed: 07/08/2022 5:58:31 PM By: [name], [name], BSN Entered By: [name], [name] on 07/08/2022 14:51:47 -------------------------------------------------------------------------------- Lower Extremity Assessment Details Patient Name: Date of Service: [name], [name] 07/08/2022 2:15 PM Medical Record Number: [id number] Patient Account Number: [id number] Date of Birth/Sex: Treating [name]: 07/11/1961 (60 y.o. [name]) [name], [name] Primary Care Tacarra Justo: Cheryll CockayneBurns, Stacy Other Clinician: Referring [name]: Treating [name]/Extender: Morley KosHoffman, Jessica Burns, Stacy Weeks in Treatment: 9 Edema Assessment Assessed: [Left: Yes] [Right: No] Edema: [Left: N] [Right: o] Calf Left: Right: Point of Measurement: 37 cm From Medial Instep 38 cm Ankle Left: Right: Point of Measurement: 11 cm From Medial Instep 25 cm Vascular Assessment Pulses: Dorsalis Pedis Palpable: [Left:Yes] Electronic Signature(s) Signed: 07/08/2022 5:58:31 PM By: [name], [name], BSN Entered By: [name], [name] on 07/08/2022 14:28:39 -------------------------------------------------------------------------------- Multi Wound Chart Details Patient Name: Date of Service: [name], [name] 07/08/2022 2:15 PM Medical Record Number: [id number] Patient Account Number: [id number] Date of Birth/Sex: Treating [name]: 08/03/1961 (60 y.o. [name]) [name], [name] Primary Care Tamaj Jurgens: Cheryll CockayneBurns, Stacy Other Clinician: Referring Janaisha Tolsma: Treating Alyxandria Wentz/Extender: Morley KosHoffman, Jessica Burns, Stacy Weeks in Treatment: 9 Vital Signs Height(in): 73 Pulse(bpm): 81 Weight(lbs): 237 Blood Pressure(mmHg):  129/88 Body Mass Index(BMI): 31.3 Temperature(F): 98.3 Respiratory Rate(breaths/min): 20 Photos: [5:Left, Plantar Foot] [N/A:N/A N/A] Wound Location: [5:Gradually Appeared] [N/A:N/A] Wounding Event: [5:Diabetic Wound/Ulcer of the Lower] [N/A:N/A] Primary Etiology: [5:Extremity Anemia, Sleep Apnea, Congestive] [N/A:N/A] Comorbid History: [5:Heart Failure, Hypertension, Peripheral Venous Disease, Cirrhosis , Type II Diabetes, Osteoarthritis, Osteomyelitis, Neuropathy 10/19/2019] [N/A:N/A] Date Acquired: [5:9] [N/A:N/A] Weeks of Treatment: [5:Open] [N/A:N/A] Wound Status: [5:No] [N/A:N/A] Wound Recurrence: [5:3.3x3.5x0.7] [N/A:N/A] Measurements L x W x D (cm) [5:9.071] [N/A:N/A] A (cm) : rea [5:6.35] [N/A:N/A] Volume (cm) : [5:-118.30%] [N/A:N/A] % Reduction in A [5:rea: -1430.10%] [N/A:N/A] % Reduction in Volume: [  5:11] Starting Position 1 (o'clock): [5:8] Ending Position 1 (o'clock): [5:0.8] Maximum Distance 1 (cm): [5:Yes] [N/A:N/A] Undermining: [5:Grade 2] [N/A:N/A] Classification: [5:Medium] [N/A:N/A] Exudate A mount: [5:Serosanguineous] [N/A:N/A] Exudate Type: [5:red, brown] [N/A:N/A] Exudate Color: [5:Thickened] [N/A:N/A] Wound Margin: [5:Large (67-100%)] [N/A:N/A] Granulation A mount: [5:Pink, Pale, Hyper-granulation, Friable N/A] Granulation Quality: [5:Small (1-33%)] [N/A:N/A] Necrotic A mount: [5:Fat Layer (Subcutaneous Tissue): Yes N/A] Exposed Structures: [5:Fascia: No Tendon: No Muscle: No Joint: No Bone: No None] [N/A:N/A] Epithelialization: [5:Debridement - Selective/Open Wound N/A] Debridement: Pre-procedure Verification/Time Out 14:40 [N/A:N/A] Taken: [5:Lidocaine 5% topical ointment] [N/A:N/A] Pain Control: [5:Callus] [N/A:N/A] Tissue Debrided: [5:Skin/Epidermis] [N/A:N/A] Level: [5:1.5] [N/A:N/A] Debridement A (sq cm): [5:rea Curette] [N/A:N/A] Instrument: [5:Minimum] [N/A:N/A] Bleeding: [5:Pressure] [N/A:N/A] Hemostasis A chieved: [5:Procedure was  tolerated well] [N/A:N/A] Debridement Treatment Response: [5:3.3x3.5x0.7] [N/A:N/A] Post Debridement Measurements L x W x D (cm) [5:6.35] [N/A:N/A] Post Debridement Volume: (cm) [5:callous to periwound.] [N/A:N/A] Assessment Notes: [5:Chemical Cauterization] [N/A:N/A] Procedures Performed: [5:Debridement] Treatment Notes Wound #5 (Foot) Wound Laterality: Plantar, Left Cleanser Wound Cleanser Discharge Instruction: Cleanse the wound with wound cleanser prior to applying a clean dressing using gauze sponges, not tissue or cotton balls. Peri-Wound Care Topical Primary Dressing KerraCel Ag Gelling Fiber Dressing, 2x2 in (silver alginate) Discharge Instruction: Apply silver alginate to wound bed as instructed Secondary Dressing ABD Pad, 5x9 Discharge Instruction: Apply over primary dressing as directed. Drawtex 4x4 in Discharge Instruction: cut to into wound bed over the alginate. Secured With Elastic Bandage 4 inch (ACE bandage) Discharge Instruction: Secure with ACE bandage. Kerlix Roll Sterile, 4.5x3.1 (in/yd) Discharge Instruction: Secure with Kerlix as directed. Paper Tape, 2x10 (in/yd) Discharge Instruction: Secure dressing with tape as directed. Compression Wrap Compression Stockings Add-Ons Electronic Signature(s) Signed: 07/08/2022 5:58:31 PM By: [name] Stall [name], BSN Signed: 07/09/2022 9:20:47 AM By: [name] DO Entered By: [name] on 07/08/2022 15:10:00 -------------------------------------------------------------------------------- Multi-Disciplinary Care Plan Details Patient Name: Date of Service: NIKOLAOS, MADDOCKS [name] 07/08/2022 2:15 PM Medical Record Number: [id number] Patient Account Number: [id number] Date of Birth/Sex: Treating [name]: [birth year] (60 y.o. [name] Sours Primary Care Bronson Bressman: [name] Other Clinician: Referring Felix Pratt: Treating Artyom Stencel/Extender: [name] in Treatment: 9 Active Inactive Necrotic  Tissue Nursing Diagnoses: Impaired tissue integrity related to necrotic/devitalized tissue Goals: Necrotic/devitalized tissue will be minimized in the wound bed Date Initiated: 06/11/2022 Target Resolution Date: 08/14/2022 Goal Status: Active Patient/caregiver will verbalize understanding of reason and process for debridement of necrotic tissue Date Initiated: 06/11/2022 Target Resolution Date: 08/12/2022 Goal Status: Active Interventions: Assess patient pain level pre-, during and post procedure and prior to discharge Treatment Activities: Apply topical anesthetic as ordered : 06/11/2022 Excisional debridement : 06/11/2022 Notes: Electronic Signature(s) Signed: 07/08/2022 5:58:31 PM By: [name] Stall [name], BSN Entered By: [name] Stall on 07/08/2022 14:26:08 -------------------------------------------------------------------------------- Pain Assessment Details Patient Name: Date of Service: [name] Oxford [name] 07/08/2022 2:15 PM Medical Record Number: [id number] Patient Account Number: [id number] Date of Birth/Sex: Treating [name]: 06/25/1961 (60 y.o. [name] Sours Primary Care Alycea Segoviano: Other Clinician: [name] Referring Havah Ammon: Treating Emmanuel Gruenhagen/Extender: [name] in Treatment: 9 Active Problems Location of Pain Severity and Description of Pain Patient Has Paino No Site Locations Rate the pain. Current Pain Level: 0 Pain Management and Medication Current Pain Management: Medication: No Cold Application: No Rest: No Massage: No Activity: No T.E.N.S.: No Heat Application: No Leg drop or elevation: No Is the Current Pain Management Adequate: Adequate How does your wound impact your activities of daily livingo Sleep: No Bathing: No Appetite: No Relationship  With Others: No Bladder Continence: No Emotions: No Bowel Continence: No Work: No Toileting: No Drive: No Dressing: No Hobbies: No Psychologist, prison and probation services) Signed: 07/08/2022  5:58:31 PM By: [name] Stall [name], BSN Entered By: [name] Stall on 07/08/2022 14:27:28 -------------------------------------------------------------------------------- Patient/Caregiver Education Details Patient Name: Date of Service: [name] Kindler 9/21/2023andnbsp2:15 PM Medical Record Number: [id number] Patient Account Number: [id number] Date of Birth/Gender: Treating [name]: 06/30/1961 (60 y.o. [name] Sours Primary Care Physician: [name] Other Clinician: Referring Physician: Treating Physician/Extender: [name] in Treatment: 9 Education Assessment Education Provided To: Patient Education Topics Provided Wound/Skin Impairment: Handouts: Skin Care Do's and Dont's Methods: Explain/Verbal Responses: Reinforcements needed Electronic Signature(s) Signed: 07/08/2022 5:58:31 PM By: [name] Stall [name], BSN Entered By: [name] Stall on 07/08/2022 14:26:19 -------------------------------------------------------------------------------- Wound Assessment Details Patient Name: Date of Service: [name] Oxford [name] 07/08/2022 2:15 PM Medical Record Number: [id number] Patient Account Number: [id number] Date of Birth/Sex: Treating [name]: [birth year] (60 y.o. [name] Sours Primary Care Von Inscoe: [name] Other Clinician: Referring Taz Vanness: Treating Lashane Whelpley/Extender: Morley Kos Weeks in Treatment: 9 Wound Status Wound Number: 5 Primary Diabetic Wound/Ulcer of the Lower Extremity Etiology: Wound Location: Left, Plantar Foot Wound Open Wounding Event: Gradually Appeared Status: Date Acquired: 10/19/2019 Comorbid Anemia, Sleep Apnea, Congestive Heart Failure, Hypertension, Weeks Of Treatment: 9 History: Peripheral Venous Disease, Cirrhosis , Type II Diabetes, Clustered Wound: No Osteoarthritis, Osteomyelitis, Neuropathy Photos Wound Measurements Length: (cm) 3.3 Width: (cm) 3.5 Depth: (cm) 0.7 Area: (cm) 9.071 Volume: (cm) 6.35 %  Reduction in Area: -118.3% % Reduction in Volume: -1430.1% Epithelialization: None Tunneling: No Undermining: Yes Starting Position (o'clock): 11 Ending Position (o'clock): 8 Maximum Distance: (cm) 0.8 Wound Description Classification: Grade 2 Wound Margin: Thickened Exudate Amount: Medium Exudate Type: Serosanguineous Exudate Color: red, brown Foul Odor After Cleansing: No Slough/Fibrino Yes Wound Bed Granulation Amount: Large (67-100%) Exposed Structure Granulation Quality: Pink, Pale, Hyper-granulation, Friable Fascia Exposed: No Necrotic Amount: Small (1-33%) Fat Layer (Subcutaneous Tissue) Exposed: Yes Necrotic Quality: Adherent Slough Tendon Exposed: No Muscle Exposed: No Joint Exposed: No Bone Exposed: No Assessment Notes callous to periwound. Treatment Notes Wound #5 (Foot) Wound Laterality: Plantar, Left Cleanser Wound Cleanser Discharge Instruction: Cleanse the wound with wound cleanser prior to applying a clean dressing using gauze sponges, not tissue or cotton balls. Peri-Wound Care Topical Primary Dressing KerraCel Ag Gelling Fiber Dressing, 2x2 in (silver alginate) Discharge Instruction: Apply silver alginate to wound bed as instructed Secondary Dressing ABD Pad, 5x9 Discharge Instruction: Apply over primary dressing as directed. Drawtex 4x4 in Discharge Instruction: cut to into wound bed over the alginate. Secured With Elastic Bandage 4 inch (ACE bandage) Discharge Instruction: Secure with ACE bandage. Kerlix Roll Sterile, 4.5x3.1 (in/yd) Discharge Instruction: Secure with Kerlix as directed. Paper Tape, 2x10 (in/yd) Discharge Instruction: Secure dressing with tape as directed. Compression Wrap Compression Stockings Add-Ons Electronic Signature(s) Signed: 07/08/2022 5:58:31 PM By: [name] Stall [name], BSN Entered By: [name] Stall on 07/08/2022 14:32:15 -------------------------------------------------------------------------------- Vitals  Details Patient Name: Date of Service: [name] Oxford [name] 07/08/2022 2:15 PM Medical Record Number: [id number] Patient Account Number: [id number] Date of Birth/Sex: Treating [name]: [birth year] (60 y.o. [name] Sours Primary Care Sandrine Bloodsworth: [name] Other Clinician: Referring Daysen Gundrum: Treating Luchiano Viscomi/Extender: Morley Kos Weeks in Treatment: 9 Vital Signs Time Taken: 14:20 Temperature (F): 98.3 Height (in): 73 Pulse (bpm): 81 Weight (lbs): 237 Respiratory Rate (breaths/min): 20 Body Mass Index (BMI): 31.3 Blood Pressure (mmHg): 129/88 Reference Range: 80 - 120 mg / dl Electronic Signature(s) Signed: 07/08/2022  5:58:31 PM By: Deon Pilling [name], BSN Entered By: Deon Pilling on 07/08/2022 14:27:09

## 2022-07-09 NOTE — Progress Notes (Signed)
CLEON, SIGNORELLI ([id number]) Visit Report for 07/08/2022 Chief Complaint Document Details Patient Name: Date of Service: [name], [name] 07/08/2022 2:15 PM Medical Record Number: [id number] Patient Account Number: [id number] Date of Birth/Sex: Treating RN: 03-Oct-1961 (60 y.o. [name] Primary Care Provider: [name] Other Clinician: Referring Provider: Treating Provider/Extender: Morley Kos Weeks in Treatment: 9 Information Obtained from: Patient Chief Complaint Left foot ulcer Electronic Signature(s) Signed: 07/09/2022 9:20:47 AM By: [name] DO Entered By: [name] on 07/08/2022 15:10:10 -------------------------------------------------------------------------------- Debridement Details Patient Name: Date of Service: [name] 07/08/2022 2:15 PM Medical Record Number: [id number] Patient Account Number: [id number] Date of Birth/Sex: Treating RN: 11-24-1960 (60 y.o. [name] Primary Care Provider: [name] Other Clinician: Referring Provider: Treating Provider/Extender: [name] in Treatment: 9 Debridement Performed for Assessment: Wound #5 Left,Plantar Foot Performed By: Physician [name], DO Debridement Type: Debridement Severity of Tissue Pre Debridement: Fat layer exposed Level of Consciousness (Pre-procedure): Awake and Alert Pre-procedure Verification/Time Out Yes - 14:40 Taken: Start Time: 14:41 Pain Control: Lidocaine 5% topical ointment T Area Debrided (L x W): otal 0.5 (cm) x 3 (cm) = 1.5 (cm) Tissue and other material debrided: Viable, Non-Viable, Callus, Skin: Dermis , Skin: Epidermis Level: Skin/Epidermis Debridement Description: Selective/Open Wound Instrument: Curette Bleeding: Minimum Hemostasis Achieved: Pressure End Time: 14:45 Response to Treatment: Procedure was tolerated well Level of Consciousness (Post- Awake and Alert procedure): Post Debridement  Measurements of Total Wound Length: (cm) 3.3 Width: (cm) 3.5 Depth: (cm) 0.7 Volume: (cm) 6.35 Character of Wound/Ulcer Post Debridement: Stable Severity of Tissue Post Debridement: Fat layer exposed Post Procedure Diagnosis Same as Pre-procedure Electronic Signature(s) Signed: 07/08/2022 5:58:31 PM By: Shawn Stall RN, BSN Signed: 07/09/2022 9:20:47 AM By: [name] DO Entered By: Shawn Stall on 07/08/2022 14:49:06 -------------------------------------------------------------------------------- HPI Details Patient Name: Date of Service: [name] 07/08/2022 2:15 PM Medical Record Number: [id number] Patient Account Number: [id number] Date of Birth/Sex: Treating RN: 09-Mar-1961 (60 y.o. [name] Primary Care Provider: [name] Other Clinician: Referring Provider: Treating Provider/Extender: [name] in Treatment: 9 History of Present Illness HPI Description: 10/31/2019 upon evaluation today patient appears to be doing somewhat poorly in regard to his bilateral plantar feet. He has wounds that he tells me have been present since 2012 intermittently off and on. Most recently this has been open for at least the past 6 months to a year. He has been trying to treat this in different ways using Santyl along with various other dressings including Medihoney and even at one point Xeroform. Nothing really has seem to get this completely closed. He was recently in the hospital for cellulitis of his leg subsequently he did have x-rays as well as MRIs that showed negative for any signs of osteomyelitis in regard to the wounds on his feet. Fortunately there is no signs of systemic infection at this time. No fevers, chills, nausea, vomiting, or diarrhea. Patient has previously used Darco offloading shoes as far as frontal floaters as well as postop surgical shoes. He has never been in a total contact cast that may be something we need to strongly  consider here. Patient's most recent hemoglobin A1c 1 month ago was 5.3 seems to be very well controlled which is great. Subsequently he has seen vascular as well as podiatry. His ABIs are 1.07 on the left and 1.14 on the right he seems to be doing well he does have chronic venous stasis. 11/07/2019 upon evaluation today  patient appears to be doing well with regard to his wounds all things considered. I do not see any severe worsening he still has some callus buildup on the right more than the left he notes that he has been probably more active than he should as far as walking is concerned is just very hard to not be active. He knows he needs to be more careful in this regard however. He is willing to give the cast a try at this point although he notes that he is a little nervous about this just with regard to balance although he will be very careful and obviously if he has any trouble he knows to contact the office and let me know. 1/22; patient is in for his obligatory first total contact cast change. Our intake nurse reported a very large amount of drainage which is spelled out over to the surrounding skin. Has bilateral diabetic foot wounds. He has Charcot feet. We have been using silver alginate on his wounds. 11/14/2019 on evaluation today patient is actually seeming to make good improvement in regard to his bilateral plantar foot wounds. We have been using a cast on the left side and on the right side he has been using dressings he is changing up his own accord. With that being said he tells me that he is also not walking as much just due to how unsteady he feels. He takes it easy when he does have to walk and when he does not have to walk he is resting. This is probably help in his right foot as well has the left foot which is actually measuring better. In fact both are measuring better. Overall I am very pleased with how things seem to be progressing. The patient does have some odor on the left  foot this does have me concerned about the possibility of infection, and actually probably go ahead and put him on antibiotics today as well as utilizing a continuation of the cast on the left foot I think that will be fine we probably just need to bring him in sooner to change this not last a whole week. 1/29; we brought the patient back today for a total contact cast change on the left out of concern for excessive drainage. We are using drawtex over the wound as the primary dressing 11/21/2019 on evaluation today patient appears to be doing well with regard to his left plantar foot. In fact both foot ulcers actually seem to be doing pretty well. Nonetheless he is having a lot of drainage on the left at this time and again we did obtain a wound culture did show positive for Staph aureus that was reviewed by myself today as well. Nonetheless he is on Bactrim which was shown to be sensitive that should be helping in this regard. Fortunately there is no signs of infection systemically at this point. 2/5; back in clinic today for a total contact cast change apparently secondary to very significant drainage. Still using drawtex 11/28/2019 upon evaluation today patient appears to be doing well with regard to his wounds. The right foot is doing okay as measured about the same in my opinion. The left foot is actually showing signs of significant improvement is measuring smaller there is a lot of hyper granulation likely due to the continued drainage at this point. We did obtain approval for a snap VAC I think that is good to be appropriate for him and will likely help this tremendously underneath the cast. He  is definitely in agreement with proceeding with such. 2/12; patient came in today after his snap VAC lost suction. Brought in to see one of our nurses. The dressing was replaced and then we put the cast back on and rehooked up the snap VAC. Apparently his wound looked very good per our intake nurse. 2/15;  again we replaced the cast on Friday. By Saturday the snap VAC and light suction. He called this morning he comes in acutely. The wounds look fine however the VAC is not functioning. We replaced the cast using silver alginate as the primary dressing backed with Kerramax. The snap VAC was not replaced 12/05/2019 upon evaluation today patient appears to be doing better in regard to his left plantar foot ulcer. Fortunately there is no signs of active infection at this time. Unfortunately he is continuing to have issues with the right foot he is really not making any progress here things seem to be somewhat stagnant to be honest. The depth has increased but that is due to me having debrided the wound in the past based on what I am seeing. 12/12/2019 on evaluation today patient appears to be doing more poorly in regard to the left lower extremity. He has some erythema spreading up the side of his foot I am concerned about infection again at this point. Unfortunately he has been seeing improvement with a total contact cast but I do not think we should put that on today. On his right plantar foot he continues to have significant drainage this is actually measuring deeper I really do not feel like you are making any progress whatsoever. I have prescribed Granix for him unfortunately his insurance apparently was going to cost him a $500 co-pay. 12/19/2019 upon evaluation today patient actually appears to be doing better in regard to both wounds. With that being said he actually did get the reGranix which he had to pay $500 for. With that being said it does look like that he is actually made some improvement based on what I am seeing at this point with the reGranix. Obviously if he is going to continue this we are going to do something about trying to get him some help in covering the cost. 12/26/2019 on evaluation today patient appears to be doing really much better even compared to last week. Overall the wound seems  to be much better even compared to last week and last week was better than the week before. Since has been using the reGranix his symptoms have improved significantly. With that being said the issue right now is simply that this is a very expensive medication for him the first dose cost him $500. Upon inspection patient's wound bed actually is however dramatically improved compared to before he started this 2 weeks ago. 01/02/2020 upon evaluation today patient appears to actually be doing well. He still had a little bit of reGranix left that has been using in small amounts he just been applying it every other day instead of every day in order to make it go longer. Overall we are still seeing excellent improvement he is measuring smaller looking better healthier tissue and everything seems to be pointing to this headed in the right direction. Fortunately there is no evidence of infection either which is also excellent news. He does have his MRI coming up within the next week. 01/09/2020 upon evaluation today patient appears to be doing a little worse today compared to previous week's evaluation. He is actually been out of the reGranix  at this point. He has been trying to make the stretch out so he has been changing the dressings on a regular set schedule like he was previous. I think this has made a difference. Fortunately there is no signs of active infection at this time. No fevers, chills, nausea, vomiting, or diarrhea. 01/16/2020 upon evaluation today patient appears to be doing well with regard to his left plantar foot ulcer. The right plantar foot still shows some significant depth at this point. Fortunately there is no signs of active infection at this time. 01/30/2020 upon evaluation today patient appears to be doing about the same in regard to his right plantar foot ulcer there is still some depth here and we had to wait till he actually switched over to his new insurance to get approval for the  MRI under his new insurance plan. With that being said he now has switched as of April 1. Fortunately there is no signs of active infection at this time. Overall in regard to his left foot ulcer this seems to be doing much better and I am actually very pleased with how things are going. With that being said it is not quite as much progress as we were seeing with the reGranix but at the same time he has had trouble getting this apparently there is been some hindrance here. I Ernie Hew try to actually send this to [facility] that was recommended by the drug rep to me. 02/13/20 upon evaluation today patient appears to be doing better in regard to his left foot ulcer this is great news. Unfortunately the right foot ulcer is not really significantly better at this time. There is no signs of active infection systemically though he did have his MRI which showed unfortunately he does have infection noted including an abscess in the foot. There is also marrow changes noted which are consistent with osteomyelitis based on the radiology review and interpretation. Unfortunately considering that the wound is not really making the progress that we will he would like to be seen I think that this is an indication that he may need some further referral both infectious disease as well as potentially to podiatrist to see if there is anything that can be done to help with the situation that were dealing with here. The left foot again is doing great. READMISSION 06/04/2021 This is a 61 year old man who was in the clinic in 2021 followed by Allen Derry for areas on the right and left foot. He developed a left foot infection and was referred to ID. He left the clinic in a nonhealed state and was followed for a period of [facility] Marylene Land. Apparently things really deteriorated in early July when he was admitted to hospital from 04/27/2021 through 05/06/2021 with sepsis secondary to a left foot infection.  His blood cultures were negative. An MRI suggested fifth metatarsal osteomyelitis a left ankle septic joint. He was treated with vancomycin and ertapenem which he is still taking and may just about be finishing. He was seen by orthopedics and the patient adamantly refused to BKA. As far as I can tell he did not have the ankle aspirated I am not exactly sure what the issue was here. Since he has been discharged she is at [facility] health care for rehabilitation. He was last seen by Dr. Algis Liming on 05/20/2021 he noted osteo of the tibial talar bone cuboid and fifth metatarsal which is even more extensive than what was suggested by the MRI.  He is apparently going for a consultation with orthopedic surgery in [place] sometime next week. I received a call about this man 2 weeks ago from Dr. Allyson Sabal who follows him for the possibility of PAD. ABIs I think done in the office showed a ABI on the right of 1.05 at the PTA and 0.99 at the PTA on the left. He had a DVT rule out in the left leg that was negative for the DVT. Past medical history is extensive and includes diastolic heart failure, right first metatarsal head ulcer in 2021, excision of the right second ray by Dr. Marylene Land on 03/13/2020, hypertension, hypothyroidism. Left total hip replacement, right total knee replacement, carpal tunnel syndrome, obstructive sleep apnea alcohol abuse with cirrhosis although the patient denies current alcohol intake. The patient does not think he is a diabetic however looking through [place] link I see 2 HgbAic's of this year that were greater than equal to 6.5 which by definition makes him diabetic. Nevertheless he is not on any treatment and does not check his blood sugars. The patient is now back home out of the nursing home. Saw Dr. Algis Liming last week he was taken off vancomycin and ertapenem on August 22 and now is on doxycycline on Augmentin. He also saw Dr. Weston Anna who is his orthopedic surgeon in [place] he  recommended a [employer]	[place]. He has been using Medihoney. 9/6I have been having trouble getting hyperbarics approved through our prior authorization process. Even though he had a limb threatening infection in the left foot and probably the left ankle there glitches in how some of the reports are worded also some of the consultants. In any case I am going to repeat his sedimentation rate and C-reactive protein. I am generally not in favor of doing things like this as they really do not alter the plan of care from my point of view however I am going to need to demonstrate that these remain high in order to get this through forhyperbaric treatment for chronic refractory osteomyelitis 9/13; following this man for a wound on his left plantar foot in the setting of type 2 diabetes and Charcot deformity. He has underlying chronic refractory osteomyelitis. Follow-up sedimentation rate and C-reactive protein were both elevated but the C-reactive protein was down to 1.4, sedimentation rate at 70. Sedimentation rate was only slightly down from previous at 85. His wound is measuring slightly smaller. 9/20; patient started hyperbaric oxygen therapy today and tolerated treatment well. This is for the underlying osteomyelitis. He remains on antibiotics but thinks he is getting close to finishing. The wound on the plantar aspect of his foot is the other issue we are following here. He is using Medihoney The patient has a Charcot foot in the setting of type 2 diabetes. He is going to need a total contact cast although his partner was away this week and we elected to delay this till next week 9/27; patient still tolerating hyperbaric oxygen well. Wound looked generally healthy not much depth under illumination still 100% covered in fibrinous debris. Raised callused edges around the wound he was prepared for a total contact cast. We have been using Medihoney 9/30; patient is back for his first obligatory total contact cast  change. We are using Hydrofera Blue. Noted by our intake nurse to have a lot of drainage or at least a moderate amount of drainage. I am not sure I was previously aware of this 10/4; patient arrives today with a lot of drainage under the cast.  When he had it changed last Friday there was also a similar amount of drainage. Her intake nurse says that they tried a wound VAC on him perhaps while I was on vacation in August under the cast but that did not work. In my experience that has not been unusual. We have been using Hydrofera Blue with all the secondary absorbers. The drainage today went right through all of our dressings. The patient is concerned about his foot being in a cast without much drainage. He is tolerating HBO well. There has been improvements in the wound in the mid part of his foot in the setting of a Charcot deformity 10/7; patient presents for cast change. He has no issues or complaints today. He denies signs of infection. 10/11; patient presents for cast change. At this time he would like to take a break from the cast. He would like to do daily dressing changes with Hydrofera Blue. He currently denies signs of infection. 10/14; his cast was taken off last week at his request. He arrives in the clinic with [facility]. He has been changing the dressing himself. He has way too much edema in the left foot and leg to consider a total contact cast. I do not really know the issue here. He does have chronic venous insufficiency His wife stopped me in the clinic earlier in the week to report he is drinking again and she is concerned. I am uncertain whether there are other issues 10/18; he arrived in clinic last week having bilateral lower extremity edema likely secondary to chronic venous insufficiency there was too much edema in the left leg to apply a total contact cast. I put him in compression on the left leg to control the swelling. This week he cut the wrap on the foot for reasons  that are unclear however today he arrives in clinic with a smaller left leg but massive edema in the left foot. He is supposed to be wearing a juxta lite stocking on the right leg but he is not wearing the contact layer. His attendance at hyperbaric oxygen has been dwindling, he did not dive yesterday and he did not dive today concerned about hyperbarics causing swelling. I looked back in his record his last echo was in 2020 essentially normal left and right ventricular function. Last BUN and creatinine were done in July this was normal. He is having a lot of drainage in the left plantar foot wound 10/25; again he comes in today having missed HBO yesterday. Macerated skin around the wound which really does not look very good at all. A lot of edema in the left foot but an improvement in edema on the left leg. We wrapped him last week because of the amount of swelling in the left leg. We could not apply a total contact cast. The patient states that he wants to be able to change his dressing himself. I might consider this if he had stockings to control the swelling. He said he be here for HBO tomorrow. I will check the degree of erythema in his forefoot which I have marked. He is on doxycycline and Augmentin which was renewed by Dr. Algis Liming but I cannot see a follow-up note, follow-up inflammatory markers etc. I 1 point he said he was going to see his orthopedic surgeon in [place] I am not sure if he ever did this. I do not know why he has not followed up with infectious disease, he says he was not given an  appointment but he is still on Augmentin doxycycline 11/1; he did not do the lab work I ordered last week. Still on Augmentin and doxycycline. Silver alginate and he is changing this daily using his own juxta lite stocking. The surface of the wound does not look too bad. No real epithelialization however. The patient was seen today along with HBO 08/26/2021 upon evaluation today patient is being seen  at his request by myself he wanted to transfer care to see me. With that being said he has been seeing Dr. Leanord Hawking since he came back in August of this year. Unfortunately he tells me he still having quite a bit of drainage. He is also significant erythema and warmth noted of the foot as well. He has been hit or miss with regard to hyperbaric oxygen therapy tells me that at this point he took this week off because he was extremely claustrophobic and having a lot of issues he plans to start back next week. Nonetheless he has been given some medication by Dr. Leanord Hawking as well to help calm things down which again may help him. Hopefully he can get back in hyperbarics as I think this is likely necessary. Nonetheless there does seem to be evidence of cellulitis noted today as well and in general I am a little concerned in that regard. His wound unfortunately is significant on this left foot and is right where he takes the brunt of the force secondary to the Charcot arthropathy. I do not think a total contact cast is ideal due to the fact that he unfortunately is draining much too significantly he is also not happy with the idea of using a cast therefore he states he would not want to do that anyway. 09/16/2021 upon evaluation today patient's plantar foot ulcer unfortunately continues to show signs of issues here. He has been in the hospital due to trying to detox himself from alcohol he had withdrawal symptoms and subsequently was hospitalized. During that time it was recommended by both orthopedics as well as infectious disease apparently that he proceed with amputation. With that being said they discussed with him the risk of not doing so. This is well- documented in the encounter. With that being said the patient does not want to proceed down that road and tells me that he declined their advice in that regard. Subsequently our goal then is to try to do what we can to try to get this thing healed and closed. I  think this means he is getting need to stay off of it is much as possible he does have a wheelchair at home that he tells me he can use I think that that is going to be something that he does need to do. 09/23/2021 upon evaluation today patient continues to have significant issues here with his foot ulcer which is plantar on the left foot. Subsequently again he does have a history of Charcot foot he also has an issue of having had osteomyelitis as well as an open wound for some time here. Its been recommended multiple times for him to have an amputation although that is not something that he is really interested or wanting to do. 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 Subsequently this means that he also has a gait abnormality that is causing issues with his walking and causing abnormal pressures in the central part of his foot this is the reason he has a wound. Nonetheless I want  to see what I can do about trying to get a boot to help with stabilization and offloading of working to see what we can do in that regard. 09/30/2021 upon evaluation today patient appears to be doing well with regard to his plantar foot ulcer. He still has significant granular hypergranulation but again this is much less than what it was even last time I saw him last week. Fortunately I think we are headed in the right direction. This is definitely something we could consider a skin graft or something along those lines if we can get it flattened out enough and get the drainage from being so significant. I feel like we are making some headway here. 10/07/2021 upon evaluation today patient appears to be doing decently well in regard to his wound. Fortunately there does not appear to be any signs of infection overall I feel like it is actually showing some signs of improvement. He still has some hypergranulation but this is much less than its been. 10/21/2021 upon evaluation today  patient appears to be doing well with regard to his wound is actually showing some signs of improvement which is great. We have been doing the chemical cauterization with silver nitrate as well as debridement as needed and again has been using silver alginate up to this point. With that being said he does have some Hydrofera Blue at home he wonders if that can be beneficial at this point. 11/25/2021 upon evaluation today patient appears to be doing somewhat poorly in regard to his plantar foot ulcer. He is also been having some issues with his congestive heart failure. He has recently been in the hospital. I did review those notes as well and apparently his heart failure has been somewhat out of control. He is actually being managed as an outpatient in this regard but nonetheless this is something that can still be kept a close eye on according to the notes and what I see. Fortunately I do not see any signs of active infection at this time which is great news. Nonetheless I am concerned about the boggy central portion of the wound which I think is going to likely open up in the near future. Readmission: 01-27-2022 patient presents for follow-up here in the clinic today. His last been November 25, 2021 since have seen him. At that time we just ordered an MRI fortunately the MRI did not show any obvious signs of osteomyelitis and there were some reactive changes which were questionable and could not be excluded. With that being said in the interim since have seen him back on February 8 things have gotten worse from the standpoint of how the wound appears today. I do think that he is going to require potentially more debridement to clean this wound out than what I can even offer here in the clinic there is a tremendous amount of hypergranulation tissue which is not sufficient to grow new tissue over and honestly I think this is going to lead to a delay in healing if its not taking care of. I need to see if I  can get him into see a surgeon to get an opinion on whether or not they could take him to the OR for an aggressive surgical debridement to clear out this hypergranulation tissue and achieve hemostasis which is can be the biggest issue with me here in the clinic as he does tend to bleed even with very light superficial debridements. The patient's not opposed to this and he states  that if I have someone that like for him to see he would be happy to go. Otherwise his medical history really has not changed. He does tell me he is getting ready to go for an evaluation for CPAP machine. Readmission 05/04/2022 Patient was last seen 3 months ago. He states he went to rehab for alcohol abuse. He continues to have a wound on the plantar aspect of the left foot. He states this has never completely healed over the past several years. He is following with podiatry for this issue currently and they are recommending blast X with collagen and a cam boot. He has not started this yet. He has home health. Currently he is using silver alginate. He also follows with Dr. Algis Liming infectious disease who ordered an MRI of the foot as he is concerned for osteomyelitis. CRP and sed rate are elevated. He is currently on Augmentin for prophylaxis as he has a history of osteomyelitis to this foot. Infectious disease has recommended amputation. Currently patient denies systemic signs of infection. 8/7; patient is following up with podiatry on 8/11. He wanted to come in to be debrided. He has no issues or complaints today. He has been using silver alginate to the wound bed. He has home health that helps change the dressings. He had an MRI completed on 7/18 that did not show evidence of osteomyelitis. He is scheduled to see infectious disease on 8/16. 8/25; Patient is following with podiatry for his wound care and he is currently using silver alginate and a defender boot for his Left foot wound. He would like to have debridement to the  wound but is unable to see podiatry until the end of the month. He has asked if we could debride him. 9/21; patient follows with podiatry for his foot wound but likes to come into our clinic for debridements. He was last seen on 06/24/2022 by Dr. Allena Katz, podiatry. He is currently using blast X to the wound bed. He has been using a Psychologist, forensic. He is not scheduled to see podiatry until 10/11 and comes in for debridement today. Electronic Signature(s) Signed: 07/09/2022 9:20:47 AM By: [name] DO Entered By: [name] on 07/08/2022 15:11:12 -------------------------------------------------------------------------------- Chemical Cauterization Details Patient Name: Date of Service: [name], [name] [name] 07/08/2022 2:15 PM Medical Record Number: [id number] Patient Account Number: [id number] Date of Birth/Sex: Treating RN: 31-Dec-1960 (60 y.o. [name] Primary Care Provider: [name] Other Clinician: Referring Provider: Treating Provider/Extender: [name] in Treatment: 9 Procedure Performed for: Wound #5 Left,Plantar Foot Performed By: Physician [name], DO Post Procedure Diagnosis Same as Pre-procedure Notes silver nitrate used for hypergranulation tissue. Electronic Signature(s) Signed: 07/08/2022 5:58:31 PM By: Shawn Stall RN, BSN Signed: 07/09/2022 9:20:47 AM By: [name] DO Entered By: Shawn Stall on 07/08/2022 14:49:53 -------------------------------------------------------------------------------- Physical Exam Details Patient Name: Date of Service: [name], [name] [name] 07/08/2022 2:15 PM Medical Record Number: [id number] Patient Account Number: [id number] Date of Birth/Sex: Treating RN: December 30, 1960 (60 y.o. [name] Primary Care Provider: [name] Other Clinician: Referring Provider: Treating Provider/Extender: Morley Kos Weeks in Treatment: 9 Constitutional respirations regular, non-labored  and within target range for patient.. Cardiovascular 2+ dorsalis pedis/posterior tibialis pulses. Psychiatric pleasant and cooperative. Notes Left foot: T the plantar aspect there is an open wound with pale granulation, Hyper granulated areas and callus. No surrounding signs of infection. o Electronic Signature(s) Signed: 07/09/2022 9:20:47 AM By: [name] DO Entered By: [name] on 07/08/2022 15:11:46 --------------------------------------------------------------------------------  Physician Orders Details Patient Name: Date of Service: [name], [name] 07/08/2022 2:15 PM Medical Record Number: [id number] Patient Account Number: [id number] Date of Birth/Sex: Treating RN: [birth year] (61 y.o. [name] Primary Care Provider: Billey Gosling Other Clinician: Referring Provider: Treating Provider/Extender: Mickle Asper in Treatment: 9 Verbal / Phone Orders: No Diagnosis Coding ICD-10 Coding Code Description E11.621 Type 2 diabetes mellitus with foot ulcer L97.522 Non-pressure chronic ulcer of other part of left foot with fat layer exposed M86.372 Chronic multifocal osteomyelitis, left ankle and foot M14.672 Charcot's joint, left ankle and foot Follow-up Appointments Other: - Continue to follow with Podiatry, call Podiatry related to your concerned how the blastx treatment is doing with the wound. Anesthetic Wound #5 Left,Plantar Foot (In clinic) Topical Lidocaine 5% applied to wound bed Bathing/ Shower/ Hygiene May shower and wash wound with soap and water. - prior to dressing change Edema Control - Lymphedema / SCD / Other Elevate legs to the level of the heart or above for 30 minutes daily and/or when sitting, a frequency of: - throughout the day Avoid standing for long periods of time. Exercise regularly Moisturize legs daily. Compression stocking or Garment 30-40 mm/Hg pressure to: - wear the Juxtalite HD to right and left leg. apply  in the morning and remove at night. Off-Loading Other: - Defender Boot: minimal weight bearing left foot use wheelchair for mobility. Additional Orders / Instructions Follow Nutritious Diet Wound Treatment Wound #5 - Foot Wound Laterality: Plantar, Left Cleanser: Wound Cleanser 1 x Per Day/30 Days Discharge Instructions: Cleanse the wound with wound cleanser prior to applying a clean dressing using gauze sponges, not tissue or cotton balls. Prim Dressing: KerraCel Ag Gelling Fiber Dressing, 2x2 in (silver alginate) 1 x Per Day/30 Days ary Discharge Instructions: Apply silver alginate to wound bed as instructed Secondary Dressing: ABD Pad, 5x9 ([place]) 1 x Per Day/30 Days Discharge Instructions: Apply over primary dressing as directed. Secondary Dressing: Drawtex 4x4 in 1 x Per Day/30 Days Discharge Instructions: cut to into wound bed over the alginate. Secured With: Elastic Bandage 4 inch (ACE bandage) (Home Health) 1 x Per Day/30 Days Discharge Instructions: Secure with ACE bandage. Secured With: [employer], 4.5x3.1 (in/yd) (Home Health) 1 x Per Day/30 Days Discharge Instructions: Secure with Kerlix as directed. Secured With: Paper Tape, 2x10 (in/yd) (Home Health) 1 x Per Day/30 Days Discharge Instructions: Secure dressing with tape as directed. Electronic Signature(s) Signed: 07/09/2022 9:20:47 AM By: Kalman Shan DO Entered By: Kalman Shan on 07/08/2022 15:11:53 -------------------------------------------------------------------------------- Problem List Details Patient Name: Date of Service: [name], [name] [name] 07/08/2022 2:15 PM Medical Record Number: [id number] Patient Account Number: [id number] Date of Birth/Sex: Treating RN: February 04, 1961 (61 y.o. [name] Primary Care Provider: Billey Gosling Other Clinician: Referring Provider: Treating Provider/Extender: Lucile Crater Weeks in Treatment: 9 Active Problems ICD-10 Encounter Code  Description Active Date MDM Diagnosis E11.621 Type 2 diabetes mellitus with foot ulcer 05/04/2022 No Yes L97.522 Non-pressure chronic ulcer of other part of left foot with fat layer exposed 05/04/2022 No Yes M86.372 Chronic multifocal osteomyelitis, left ankle and foot 05/04/2022 No Yes M14.672 Charcot's joint, left ankle and foot 05/04/2022 No Yes Inactive Problems Resolved Problems Electronic Signature(s) Signed: 07/09/2022 9:20:47 AM By: Kalman Shan DO Entered By: Kalman Shan on 07/08/2022 15:09:55 -------------------------------------------------------------------------------- Progress Note Details Patient Name: Date of Service: [name] 07/08/2022 2:15 PM Medical Record Number: [id number] Patient Account Number: [id number] Date of Birth/Sex: Treating RN: 01/05/1961 (61 y.o. M)  Shawn Stall Primary Care Provider: [name] Other Clinician: Referring Provider: Treating Provider/Extender: [name] in Treatment: 9 Subjective Chief Complaint Information obtained from Patient Left foot ulcer History of Present Illness (HPI) 10/31/2019 upon evaluation today patient appears to be doing somewhat poorly in regard to his bilateral plantar feet. He has wounds that he tells me have been present since 2012 intermittently off and on. Most recently this has been open for at least the past 6 months to a year. He has been trying to treat this in different ways using Santyl along with various other dressings including Medihoney and even at one point Xeroform. Nothing really has seem to get this completely closed. He was recently in the hospital for cellulitis of his leg subsequently he did have x-rays as well as MRIs that showed negative for any signs of osteomyelitis in regard to the wounds on his feet. Fortunately there is no signs of systemic infection at this time. No fevers, chills, nausea, vomiting, or diarrhea. Patient has previously used Darco  offloading shoes as far as frontal floaters as well as postop surgical shoes. He has never been in a total contact cast that may be something we need to strongly consider here. Patient's most recent hemoglobin A1c 1 month ago was 5.3 seems to be very well controlled which is great. Subsequently he has seen vascular as well as podiatry. His ABIs are 1.07 on the left and 1.14 on the right he seems to be doing well he does have chronic venous stasis. 11/07/2019 upon evaluation today patient appears to be doing well with regard to his wounds all things considered. I do not see any severe worsening he still has some callus buildup on the right more than the left he notes that he has been probably more active than he should as far as walking is concerned is just very hard to not be active. He knows he needs to be more careful in this regard however. He is willing to give the cast a try at this point although he notes that he is a little nervous about this just with regard to balance although he will be very careful and obviously if he has any trouble he knows to contact the office and let me know. 1/22; patient is in for his obligatory first total contact cast change. Our intake nurse reported a very large amount of drainage which is spelled out over to the surrounding skin. Has bilateral diabetic foot wounds. He has Charcot feet. We have been using silver alginate on his wounds. 11/14/2019 on evaluation today patient is actually seeming to make good improvement in regard to his bilateral plantar foot wounds. We have been using a cast on the left side and on the right side he has been using dressings he is changing up his own accord. With that being said he tells me that he is also not walking as much just due to how unsteady he feels. He takes it easy when he does have to walk and when he does not have to walk he is resting. This is probably help in his right foot as well has the left foot which is actually  measuring better. In fact both are measuring better. Overall I am very pleased with how things seem to be progressing. The patient does have some odor on the left foot this does have me concerned about the possibility of infection, and actually probably go ahead and put him on antibiotics today as  well as utilizing a continuation of the cast on the left foot I think that will be fine we probably just need to bring him in sooner to change this not last a whole week. 1/29; we brought the patient back today for a total contact cast change on the left out of concern for excessive drainage. We are using drawtex over the wound as the primary dressing 11/21/2019 on evaluation today patient appears to be doing well with regard to his left plantar foot. In fact both foot ulcers actually seem to be doing pretty well. Nonetheless he is having a lot of drainage on the left at this time and again we did obtain a wound culture did show positive for Staph aureus that was reviewed by myself today as well. Nonetheless he is on Bactrim which was shown to be sensitive that should be helping in this regard. Fortunately there is no signs of infection systemically at this point. 2/5; back in clinic today for a total contact cast change apparently secondary to very significant drainage. Still using drawtex 11/28/2019 upon evaluation today patient appears to be doing well with regard to his wounds. The right foot is doing okay as measured about the same in my opinion. The left foot is actually showing signs of significant improvement is measuring smaller there is a lot of hyper granulation likely due to the continued drainage at this point. We did obtain approval for a snap VAC I think that is good to be appropriate for him and will likely help this tremendously underneath the cast. He is definitely in agreement with proceeding with such. 2/12; patient came in today after his snap VAC lost suction. Brought in to see one of our  nurses. The dressing was replaced and then we put the cast back on and rehooked up the snap VAC. Apparently his wound looked very good per our intake nurse. 2/15; again we replaced the cast on Friday. By Saturday the snap VAC and light suction. He called this morning he comes in acutely. The wounds look fine however the VAC is not functioning. We replaced the cast using silver alginate as the primary dressing backed with Kerramax. The snap VAC was not replaced 12/05/2019 upon evaluation today patient appears to be doing better in regard to his left plantar foot ulcer. Fortunately there is no signs of active infection at this time. Unfortunately he is continuing to have issues with the right foot he is really not making any progress here things seem to be somewhat stagnant to be honest. The depth has increased but that is due to me having debrided the wound in the past based on what I am seeing. 12/12/2019 on evaluation today patient appears to be doing more poorly in regard to the left lower extremity. He has some erythema spreading up the side of his foot I am concerned about infection again at this point. Unfortunately he has been seeing improvement with a total contact cast but I do not think we should put that on today. On his right plantar foot he continues to have significant drainage this is actually measuring deeper I really do not feel like you are making any progress whatsoever. I have prescribed Granix for him unfortunately his insurance apparently was going to cost him a $500 co-pay. 12/19/2019 upon evaluation today patient actually appears to be doing better in regard to both wounds. With that being said he actually did get the reGranix which he had to pay $500 for. With that being  said it does look like that he is actually made some improvement based on what I am seeing at this point with the reGranix. Obviously if he is going to continue this we are going to do something about trying to get  him some help in covering the cost. 12/26/2019 on evaluation today patient appears to be doing really much better even compared to last week. Overall the wound seems to be much better even compared to last week and last week was better than the week before. Since has been using the reGranix his symptoms have improved significantly. With that being said the issue right now is simply that this is a very expensive medication for him the first dose cost him $500. Upon inspection patient's wound bed actually is however dramatically improved compared to before he started this 2 weeks ago. 01/02/2020 upon evaluation today patient appears to actually be doing well. He still had a little bit of reGranix left that has been using in small amounts he just been applying it every other day instead of every day in order to make it go longer. Overall we are still seeing excellent improvement he is measuring smaller looking better healthier tissue and everything seems to be pointing to this headed in the right direction. Fortunately there is no evidence of infection either which is also excellent news. He does have his MRI coming up within the next week. 01/09/2020 upon evaluation today patient appears to be doing a little worse today compared to previous week's evaluation. He is actually been out of the reGranix at this point. He has been trying to make the stretch out so he has been changing the dressings on a regular set schedule like he was previous. I think this has made a difference. Fortunately there is no signs of active infection at this time. No fevers, chills, nausea, vomiting, or diarrhea. 01/16/2020 upon evaluation today patient appears to be doing well with regard to his left plantar foot ulcer. The right plantar foot still shows some significant depth at this point. Fortunately there is no signs of active infection at this time. 01/30/2020 upon evaluation today patient appears to be doing about the same in  regard to his right plantar foot ulcer there is still some depth here and we had to wait till he actually switched over to his new insurance to get approval for the MRI under his new insurance plan. With that being said he now has switched as of April 1. Fortunately there is no signs of active infection at this time. Overall in regard to his left foot ulcer this seems to be doing much better and I am actually very pleased with how things are going. With that being said it is not quite as much progress as we were seeing with the reGranix but at the same time he has had trouble getting this apparently there is been some hindrance here. I Ernie Hew try to actually send this to [facility] that was recommended by the drug rep to me. 02/13/20 upon evaluation today patient appears to be doing better in regard to his left foot ulcer this is great news. Unfortunately the right foot ulcer is not really significantly better at this time. There is no signs of active infection systemically though he did have his MRI which showed unfortunately he does have infection noted including an abscess in the foot. There is also marrow changes noted which are consistent with osteomyelitis based on the radiology review and interpretation. Unfortunately considering  that the wound is not really making the progress that we will he would like to be seen I think that this is an indication that he may need some further referral both infectious disease as well as potentially to podiatrist to see if there is anything that can be done to help with the situation that were dealing with here. The left foot again is doing great. READMISSION 06/04/2021 This is a 61 year old man who was in the clinic in 2021 followed by Allen Derry for areas on the right and left foot. He developed a left foot infection and was referred to ID. He left the clinic in a nonhealed state and was followed for a period of [facility] Marylene Land.  Apparently things really deteriorated in early July when he was admitted to hospital from 04/27/2021 through 05/06/2021 with sepsis secondary to a left foot infection. His blood cultures were negative. An MRI suggested fifth metatarsal osteomyelitis a left ankle septic joint. He was treated with vancomycin and ertapenem which he is still taking and may just about be finishing. He was seen by orthopedics and the patient adamantly refused to BKA. As far as I can tell he did not have the ankle aspirated I am not exactly sure what the issue was here. Since he has been discharged she is at [facility] care for rehabilitation. He was last seen by Dr. Algis Liming on 05/20/2021 he noted osteo of the tibial talar bone cuboid and fifth metatarsal which is even more extensive than what was suggested by the MRI. He is apparently going for a consultation with orthopedic surgery in [place] sometime next week. I received a call about this man 2 weeks ago from Dr. Allyson Sabal who follows him for the possibility of PAD. ABIs I think done in the office showed a ABI on the right of 1.05 at the PTA and 0.99 at the PTA on the left. He had a DVT rule out in the left leg that was negative for the DVT. Past medical history is extensive and includes diastolic heart failure, right first metatarsal head ulcer in 2021, excision of the right second ray by Dr. Marylene Land on 03/13/2020, hypertension, hypothyroidism. Left total hip replacement, right total knee replacement, carpal tunnel syndrome, obstructive sleep apnea alcohol abuse with cirrhosis although the patient denies current alcohol intake. The patient does not think he is a diabetic however looking through [facility]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 CPT4 Code: [id number] Description: [phone] - DEBRIDE WOUND 1ST 20 SQ CM OR < ICD-10 Diagnosis Description L97.522 Non-pressure chronic ulcer of other part of left foot with fat layer exposed Modifier: Quantity: 1 Physician Procedures : CPT4 Code Description Modifier [id number] - WC PHYS DEBR WO ANESTH 20 SQ CM ICD-10 Diagnosis Description L97.522 Non-pressure chronic ulcer of other part of left foot with fat layer exposed Quantity: 1 Electronic Signature(s) Signed: 07/09/2022 9:20:47 AM By: [name] DO Entered By: [name] on 07/08/2022 15:13:06

## 2022-07-10 ENCOUNTER — Other Ambulatory Visit: Payer: Self-pay | Admitting: Internal Medicine

## 2022-07-12 ENCOUNTER — Encounter (INDEPENDENT_AMBULATORY_CARE_PROVIDER_SITE_OTHER): Payer: Self-pay

## 2022-07-12 ENCOUNTER — Other Ambulatory Visit (HOSPITAL_COMMUNITY): Payer: Self-pay

## 2022-07-12 MED ORDER — POTASSIUM CHLORIDE CRYS ER 20 MEQ PO TBCR
40.0000 meq | EXTENDED_RELEASE_TABLET | Freq: Every day | ORAL | 2 refills | Status: DC
  Filled 2022-07-12: qty 30, 15d supply, fill #0

## 2022-07-13 DIAGNOSIS — F10139 Alcohol abuse with withdrawal, unspecified: Secondary | ICD-10-CM

## 2022-07-13 DIAGNOSIS — M2042 Other hammer toe(s) (acquired), left foot: Secondary | ICD-10-CM

## 2022-07-13 DIAGNOSIS — Z79899 Other long term (current) drug therapy: Secondary | ICD-10-CM

## 2022-07-13 DIAGNOSIS — E11621 Type 2 diabetes mellitus with foot ulcer: Secondary | ICD-10-CM | POA: Diagnosis not present

## 2022-07-13 DIAGNOSIS — F411 Generalized anxiety disorder: Secondary | ICD-10-CM

## 2022-07-13 DIAGNOSIS — E785 Hyperlipidemia, unspecified: Secondary | ICD-10-CM

## 2022-07-13 DIAGNOSIS — Z7982 Long term (current) use of aspirin: Secondary | ICD-10-CM

## 2022-07-13 DIAGNOSIS — Z9181 History of falling: Secondary | ICD-10-CM

## 2022-07-13 DIAGNOSIS — E876 Hypokalemia: Secondary | ICD-10-CM

## 2022-07-13 DIAGNOSIS — I110 Hypertensive heart disease with heart failure: Secondary | ICD-10-CM

## 2022-07-13 DIAGNOSIS — Z6834 Body mass index (BMI) 34.0-34.9, adult: Secondary | ICD-10-CM

## 2022-07-13 DIAGNOSIS — M869 Osteomyelitis, unspecified: Secondary | ICD-10-CM | POA: Diagnosis not present

## 2022-07-13 DIAGNOSIS — G4733 Obstructive sleep apnea (adult) (pediatric): Secondary | ICD-10-CM

## 2022-07-13 DIAGNOSIS — M2041 Other hammer toe(s) (acquired), right foot: Secondary | ICD-10-CM

## 2022-07-13 DIAGNOSIS — K219 Gastro-esophageal reflux disease without esophagitis: Secondary | ICD-10-CM

## 2022-07-13 DIAGNOSIS — E1151 Type 2 diabetes mellitus with diabetic peripheral angiopathy without gangrene: Secondary | ICD-10-CM

## 2022-07-13 DIAGNOSIS — M5416 Radiculopathy, lumbar region: Secondary | ICD-10-CM

## 2022-07-13 DIAGNOSIS — I872 Venous insufficiency (chronic) (peripheral): Secondary | ICD-10-CM

## 2022-07-13 DIAGNOSIS — K7030 Alcoholic cirrhosis of liver without ascites: Secondary | ICD-10-CM

## 2022-07-13 DIAGNOSIS — I5042 Chronic combined systolic (congestive) and diastolic (congestive) heart failure: Secondary | ICD-10-CM

## 2022-07-13 DIAGNOSIS — Z4800 Encounter for change or removal of nonsurgical wound dressing: Secondary | ICD-10-CM

## 2022-07-13 DIAGNOSIS — D649 Anemia, unspecified: Secondary | ICD-10-CM

## 2022-07-13 DIAGNOSIS — G621 Alcoholic polyneuropathy: Secondary | ICD-10-CM

## 2022-07-13 DIAGNOSIS — E1169 Type 2 diabetes mellitus with other specified complication: Secondary | ICD-10-CM | POA: Diagnosis not present

## 2022-07-13 DIAGNOSIS — L97421 Non-pressure chronic ulcer of left heel and midfoot limited to breakdown of skin: Secondary | ICD-10-CM | POA: Diagnosis not present

## 2022-07-13 DIAGNOSIS — F32A Depression, unspecified: Secondary | ICD-10-CM
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
## 2022-07-13 NOTE — Patient Instructions (Signed)
Health Maintenance, Male Adopting a healthy lifestyle and getting preventive care are important in promoting health and wellness. Ask your health care provider about: The right schedule for you to have regular tests and exams. Things you can do on your own to prevent diseases and keep yourself healthy. What should I know about diet, weight, and exercise? Eat a healthy diet  Eat a diet that includes plenty of vegetables, fruits, low-fat dairy products, and lean protein. Do not eat a lot of foods that are high in solid fats, added sugars, or sodium. Maintain a healthy weight Body mass index (BMI) is a measurement that can be used to identify possible weight problems. It estimates body fat based on height and weight. Your health care provider can help determine your BMI and help you achieve or maintain a healthy weight. Get regular exercise Get regular exercise. This is one of the most important things you can do for your health. Most adults should: Exercise for at least 150 minutes each week. The exercise should increase your heart rate and make you sweat (moderate-intensity exercise). Do strengthening exercises at least twice a week. This is in addition to the moderate-intensity exercise. Spend less time sitting. Even light physical activity can be beneficial. Watch cholesterol and blood lipids Have your blood tested for lipids and cholesterol at 61 years of age, then have this test every 5 years. You may need to have your cholesterol levels checked more often if: Your lipid or cholesterol levels are high. You are older than 61 years of age. You are at high risk for heart disease. What should I know about cancer screening? Many types of cancers can be detected early and may often be prevented. Depending on your health history and family history, you may need to have cancer screening at various ages. This may include screening for: Colorectal cancer. Prostate cancer. Skin cancer. Lung  cancer. What should I know about heart disease, diabetes, and high blood pressure? Blood pressure and heart disease High blood pressure causes heart disease and increases the risk of stroke. This is more likely to develop in people who have high blood pressure readings or are overweight. Talk with your health care provider about your target blood pressure readings. Have your blood pressure checked: Every 3-5 years if you are 18-39 years of age. Every year if you are 40 years old or older. If you are between the ages of 65 and 75 and are a current or former smoker, ask your health care provider if you should have a one-time screening for abdominal aortic aneurysm (AAA). Diabetes Have regular diabetes screenings. This checks your fasting blood sugar level. Have the screening done: Once every three years after age 45 if you are at a normal weight and have a low risk for diabetes. More often and at a younger age if you are overweight or have a high risk for diabetes. What should I know about preventing infection? Hepatitis B If you have a higher risk for hepatitis B, you should be screened for this virus. Talk with your health care provider to find out if you are at risk for hepatitis B infection. Hepatitis C Blood testing is recommended for: Everyone born from 1945 through 1965. Anyone with known risk factors for hepatitis C. Sexually transmitted infections (STIs) You should be screened each year for STIs, including gonorrhea and chlamydia, if: You are sexually active and are younger than 61 years of age. You are older than 61 years of age and your   health care provider tells you that you are at risk for this type of infection. Your sexual activity has changed since you were last screened, and you are at increased risk for chlamydia or gonorrhea. Ask your health care provider if you are at risk. Ask your health care provider about whether you are at high risk for HIV. Your health care provider  may recommend a prescription medicine to help prevent HIV infection. If you choose to take medicine to prevent HIV, you should first get tested for HIV. You should then be tested every 3 months for as long as you are taking the medicine. Follow these instructions at home: Alcohol use Do not drink alcohol if your health care provider tells you not to drink. If you drink alcohol: Limit how much you have to 0-2 drinks a day. Know how much alcohol is in your drink. In the [place] one drink equals one 12 oz bottle of beer (355 mL), one 5 oz glass of Marigene Erler (148 mL), or one 1 oz glass of hard liquor (44 mL). Lifestyle Do not use any products that contain nicotine or tobacco. These products include cigarettes, chewing tobacco, and vaping devices, such as e-cigarettes. If you need help quitting, ask your health care provider. Do not use street drugs. Do not share needles. Ask your health care provider for help if you need support or information about quitting drugs. General instructions Schedule regular health, dental, and eye exams. Stay current with your vaccines. Tell your health care provider if: You often feel depressed. You have ever been abused or do not feel safe at home. Summary Adopting a healthy lifestyle and getting preventive care are important in promoting health and wellness. Follow your health care provider's instructions about healthy diet, exercising, and getting tested or screened for diseases. Follow your health care provider's instructions on monitoring your cholesterol and blood pressure. This information is not intended to replace advice given to you by your health care provider. Make sure you discuss any questions you have with your health care provider. Document Revised: 02/23/2021 Document Reviewed: 02/23/2021 [employer] Patient Education  2023 [employer]  

## 2022-07-14 ENCOUNTER — Ambulatory Visit (INDEPENDENT_AMBULATORY_CARE_PROVIDER_SITE_OTHER): Payer: Medicare Other | Admitting: *Deleted

## 2022-07-14 ENCOUNTER — Other Ambulatory Visit: Payer: Self-pay | Admitting: Internal Medicine

## 2022-07-14 DIAGNOSIS — Z1211 Encounter for screening for malignant neoplasm of colon: Secondary | ICD-10-CM

## 2022-07-14 DIAGNOSIS — Z0000 Encounter for general adult medical examination without abnormal findings: Secondary | ICD-10-CM

## 2022-07-15 ENCOUNTER — Encounter (INDEPENDENT_AMBULATORY_CARE_PROVIDER_SITE_OTHER): Payer: Medicare Other | Admitting: Ophthalmology

## 2022-07-26 ENCOUNTER — Encounter (HOSPITAL_BASED_OUTPATIENT_CLINIC_OR_DEPARTMENT_OTHER): Payer: Medicare Other | Attending: Internal Medicine | Admitting: Internal Medicine

## 2022-07-26 DIAGNOSIS — E11621 Type 2 diabetes mellitus with foot ulcer: Secondary | ICD-10-CM | POA: Diagnosis present

## 2022-07-26 DIAGNOSIS — M86372 Chronic multifocal osteomyelitis, left ankle and foot: Secondary | ICD-10-CM | POA: Insufficient documentation

## 2022-07-26 DIAGNOSIS — E1151 Type 2 diabetes mellitus with diabetic peripheral angiopathy without gangrene: Secondary | ICD-10-CM | POA: Diagnosis not present

## 2022-07-26 DIAGNOSIS — E1140 Type 2 diabetes mellitus with diabetic neuropathy, unspecified: Secondary | ICD-10-CM | POA: Diagnosis not present

## 2022-07-26 DIAGNOSIS — I872 Venous insufficiency (chronic) (peripheral): Secondary | ICD-10-CM | POA: Insufficient documentation

## 2022-07-26 DIAGNOSIS — L97522 Non-pressure chronic ulcer of other part of left foot with fat layer exposed: Secondary | ICD-10-CM | POA: Diagnosis not present

## 2022-07-26 DIAGNOSIS — I110 Hypertensive heart disease with heart failure: Secondary | ICD-10-CM | POA: Diagnosis not present

## 2022-07-26 DIAGNOSIS — M1990 Unspecified osteoarthritis, unspecified site: Secondary | ICD-10-CM | POA: Insufficient documentation

## 2022-07-26 DIAGNOSIS — I5032 Chronic diastolic (congestive) heart failure: Secondary | ICD-10-CM | POA: Insufficient documentation

## 2022-07-26 NOTE — Progress Notes (Signed)
LION, FERNANDEZ ([id number]) [id number] Page 1 of 13 Visit Report for 07/26/2022 Chief Complaint Document Details Patient Name: Date of Service: [name], [name] 07/26/2022 11:00 A M Medical Record Number: [id number] Patient Account Number: [id number] Date of Birth/Sex: Treating RN: 1961-01-09 (61 y.o. M) Primary Care Provider: [name] Other Clinician: Referring Provider: Treating Provider/Extender: [name] in Treatment: 11 Information Obtained from: Patient Chief Complaint Left foot ulcer Electronic Signature(s) Signed: 07/26/2022 12:26:27 PM By: [name] DO Entered By: [name] on 07/26/2022 11:55:17 -------------------------------------------------------------------------------- Debridement Details Patient Name: Date of Service: [name] 07/26/2022 11:00 A M Medical Record Number: [id number] Patient Account Number: [id number] Date of Birth/Sex: Treating RN: July 22, 1961 (61 y.o. [name] Primary Care Provider: [name] Other Clinician: Referring Provider: Treating Provider/Extender: [name] in Treatment: 11 Debridement Performed for Assessment: Wound #5 Left,Plantar Foot Performed By: Physician [name], DO Debridement Type: Debridement Severity of Tissue Pre Debridement: Fat layer exposed Level of Consciousness (Pre-procedure): Awake and Alert Pre-procedure Verification/Time Out Yes - 11:34 Taken: Start Time: 11:35 T Area Debrided (L x W): otal 3.8 (cm) x 3.5 (cm) = 13.3 (cm) Tissue and other material debrided: Non-Viable, Callus, Subcutaneous, Hyper-granulation Level: Skin/Subcutaneous Tissue Debridement Description: Excisional Instrument: Curette, Other : Silver Nitrate Bleeding: Moderate Hemostasis Achieved: Pressure End Time: 11:40 Response to Treatment: Procedure was tolerated well Level of Consciousness (Post- Awake and  Alert procedure): Post Debridement Measurements of Total Wound Length: (cm) 3.8 Width: (cm) 3.5 Depth: (cm) 0.4 Volume: (cm) 4.178 Character of Wound/Ulcer Post Debridement: Stable Severity of Tissue Post Debridement: Fat layer exposed Post Procedure Diagnosis Same as [name], [name] ([id number]) [id number] Page 2 of 13 Notes Procedure scribed for Dr. Mikey Bussing by Gardiner Fanti, RN Electronic Signature(s) Signed: 07/26/2022 12:26:27 PM By: [name] DO Signed: 07/26/2022 4:35:51 PM By: [name] Entered By: [name] on 07/26/2022 11:41:49 -------------------------------------------------------------------------------- HPI Details Patient Name: Date of Service: [name] 07/26/2022 11:00 A M Medical Record Number: [id number] Patient Account Number: [id number] Date of Birth/Sex: Treating RN: 1961-09-10 (61 y.o. M) Primary Care Provider: [name] Other Clinician: Referring Provider: Treating Provider/Extender: [name] in Treatment: 11 History of Present Illness HPI Description: 10/31/2019 upon evaluation today patient appears to be doing somewhat poorly in regard to his bilateral plantar feet. He has wounds that he tells me have been present since 2012 intermittently off and on. Most recently this has been open for at least the past 6 months to a year. He has been trying to treat this in different ways using Santyl along with various other dressings including Medihoney and even at one point Xeroform. Nothing really has seem to get this completely closed. He was recently in the hospital for cellulitis of his leg subsequently he did have x-rays as well as MRIs that showed negative for any signs of osteomyelitis in regard to the wounds on his feet. Fortunately there is no signs of systemic infection at this time. No fevers, chills, nausea, vomiting, or diarrhea. Patient has previously used [employer]  offloading shoes as far as frontal floaters as well as postop surgical shoes. He has never been in a total contact cast that may be something we need to strongly consider here. Patient's most recent hemoglobin A1c 1 month ago was 5.3 seems to be very well controlled which is great. Subsequently he has seen vascular as well as podiatry. His ABIs are 1.07 on the left and 1.14 on the right he seems  to be doing well he does have chronic venous stasis. 11/07/2019 upon evaluation today patient appears to be doing well with regard to his wounds all things considered. I do not see any severe worsening he still has some callus buildup on the right more than the left he notes that he has been probably more active than he should as far as walking is concerned is just very hard to not be active. He knows he needs to be more careful in this regard however. He is willing to give the cast a try at this point although he notes that he is a little nervous about this just with regard to balance although he will be very careful and obviously if he has any trouble he knows to contact the office and let me know. 1/22; patient is in for his obligatory first total contact cast change. Our intake nurse reported a very large amount of drainage which is spelled out over to the surrounding skin. Has bilateral diabetic foot wounds. He has Charcot feet. We have been using silver alginate on his wounds. 11/14/2019 on evaluation today patient is actually seeming to make good improvement in regard to his bilateral plantar foot wounds. We have been using a cast on the left side and on the right side he has been using dressings he is changing up his own accord. With that being said he tells me that he is also not walking as much just due to how unsteady he feels. He takes it easy when he does have to walk and when he does not have to walk he is resting. This is probably help in his right foot as well has the left foot which is actually  measuring better. In fact both are measuring better. Overall I am very pleased with how things seem to be progressing. The patient does have some odor on the left foot this does have me concerned about the possibility of infection, and actually probably go ahead and put him on antibiotics today as well as utilizing a continuation of the cast on the left foot I think that will be fine we probably just need to bring him in sooner to change this not last a whole week. 1/29; we brought the patient back today for a total contact cast change on the left out of concern for excessive drainage. We are using drawtex over the wound as the primary dressing 11/21/2019 on evaluation today patient appears to be doing well with regard to his left plantar foot. In fact both foot ulcers actually seem to be doing pretty well. Nonetheless he is having a lot of drainage on the left at this time and again we did obtain a wound culture did show positive for Staph aureus that was reviewed by myself today as well. Nonetheless he is on Bactrim which was shown to be sensitive that should be helping in this regard. Fortunately there is no signs of infection systemically at this point. 2/5; back in clinic today for a total contact cast change apparently secondary to very significant drainage. Still using drawtex 11/28/2019 upon evaluation today patient appears to be doing well with regard to his wounds. The right foot is doing okay as measured about the same in my opinion. The left foot is actually showing signs of significant improvement is measuring smaller there is a lot of hyper granulation likely due to the continued drainage at this point. We did obtain approval for a snap VAC I think that is good to  be appropriate for him and will likely help this tremendously underneath the cast. He is definitely in agreement with proceeding with such. 2/12; patient came in today after his snap VAC lost suction. Brought in to see one of our  nurses. The dressing was replaced and then we put the cast back on and rehooked up the snap VAC. Apparently his wound looked very good per our intake nurse. 2/15; again we replaced the cast on Friday. By Saturday the snap VAC and light suction. He called this morning he comes in acutely. The wounds look fine however the VAC is not functioning. We replaced the cast using silver alginate as the primary dressing backed with Kerramax. The snap VAC was not replaced 12/05/2019 upon evaluation today patient appears to be doing better in regard to his left plantar foot ulcer. Fortunately there is no signs of active infection at this time. Unfortunately he is continuing to have issues with the right foot he is really not making any progress here things seem to be somewhat stagnant to be honest. The depth has increased but that is due to me having debrided the wound in the past based on what I am seeing. 12/12/2019 on evaluation today patient appears to be doing more poorly in regard to the left lower extremity. He has some erythema spreading up the side of his foot I am concerned about infection again at this point. Unfortunately he has been seeing improvement with a total contact cast but I do not think we should put that on today. On his right plantar foot he continues to have significant drainage this is actually measuring deeper I really do not feel like you are making any progress whatsoever. I have prescribed Granix for him unfortunately his insurance apparently was going to cost him a $500 co-pay. 12/19/2019 upon evaluation today patient actually appears to be doing better in regard to both wounds. With that being said he actually did get the reGranix which he had to pay $500 for. With that being said it does look like that he is actually made some improvement based on what I am seeing at this point with the reGranix. Obviously if he is going to continue this we are going to do something about trying to get  him some help in covering the cost. [place], [name] ([id number]) [id number] Page 3 of 13 12/26/2019 on evaluation today patient appears to be doing really much better even compared to last week. Overall the wound seems to be much better even compared to last week and last week was better than the week before. Since has been using the reGranix his symptoms have improved significantly. With that being said the issue right now is simply that this is a very expensive medication for him the first dose cost him $500. Upon inspection patient's wound bed actually is however dramatically improved compared to before he started this 2 weeks ago. 01/02/2020 upon evaluation today patient appears to actually be doing well. He still had a little bit of reGranix left that has been using in small amounts he just been applying it every other day instead of every day in order to make it go longer. Overall we are still seeing excellent improvement he is measuring smaller looking better healthier tissue and everything seems to be pointing to this headed in the right direction. Fortunately there is no evidence of infection either which is also excellent news. He does have his MRI coming up within the next week. 01/09/2020 upon evaluation today  patient appears to be doing a little worse today compared to previous week's evaluation. He is actually been out of the reGranix at this point. He has been trying to make the stretch out so he has been changing the dressings on a regular set schedule like he was previous. I think this has made a difference. Fortunately there is no signs of active infection at this time. No fevers, chills, nausea, vomiting, or diarrhea. 01/16/2020 upon evaluation today patient appears to be doing well with regard to his left plantar foot ulcer. The right plantar foot still shows some significant depth at this point. Fortunately there is no signs of active infection at this  time. 01/30/2020 upon evaluation today patient appears to be doing about the same in regard to his right plantar foot ulcer there is still some depth here and we had to wait till he actually switched over to his new insurance to get approval for the MRI under his new insurance plan. With that being said he now has switched as of April 1. Fortunately there is no signs of active infection at this time. Overall in regard to his left foot ulcer this seems to be doing much better and I am actually very pleased with how things are going. With that being said it is not quite as much progress as we were seeing with the reGranix but at the same time he has had trouble getting this apparently there is been some hindrance here. I Ernie Hew try to actually send this to [facility] that was recommended by the drug rep to me. 02/13/20 upon evaluation today patient appears to be doing better in regard to his left foot ulcer this is great news. Unfortunately the right foot ulcer is not really significantly better at this time. There is no signs of active infection systemically though he did have his MRI which showed unfortunately he does have infection noted including an abscess in the foot. There is also marrow changes noted which are consistent with osteomyelitis based on the radiology review and interpretation. Unfortunately considering that the wound is not really making the progress that we will he would like to be seen I think that this is an indication that he may need some further referral both infectious disease as well as potentially to podiatrist to see if there is anything that can be done to help with the situation that were dealing with here. The left foot again is doing great. READMISSION 06/04/2021 This is a 62 year old man who was in the clinic in 2021 followed by Allen Derry for areas on the right and left foot. He developed a left foot infection and was referred to ID. He left the clinic in a  nonhealed state and was followed for a period of [facility] Marylene Land. Apparently things really deteriorated in early July when he was admitted to hospital from 04/27/2021 through 05/06/2021 with sepsis secondary to a left foot infection. His blood cultures were negative. An MRI suggested fifth metatarsal osteomyelitis a left ankle septic joint. He was treated with vancomycin and ertapenem which he is still taking and may just about be finishing. He was seen by orthopedics and the patient adamantly refused to BKA. As far as I can tell he did not have the ankle aspirated I am not exactly sure what the issue was here. Since he has been discharged she is at [facility] care for rehabilitation. He was last seen by Dr. Algis Liming on 05/20/2021 he noted  osteo of the tibial talar bone cuboid and fifth metatarsal which is even more extensive than what was suggested by the MRI. He is apparently going for a consultation with orthopedic surgery in [place] sometime next week. I received a call about this man 2 weeks ago from Dr. Allyson Sabal who follows him for the possibility of PAD. ABIs I think done in the office showed a ABI on the right of 1.05 at the PTA and 0.99 at the PTA on the left. He had a DVT rule out in the left leg that was negative for the DVT. Past medical history is extensive and includes diastolic heart failure, right first metatarsal head ulcer in 2021, excision of the right second ray by Dr. Marylene Land on 03/13/2020, hypertension, hypothyroidism. Left total hip replacement, right total knee replacement, carpal tunnel syndrome, obstructive sleep apnea alcohol abuse with cirrhosis although the patient denies current alcohol intake. The patient does not think he is a diabetic however looking through [place] link I see 2 HgbAic's of this year that were greater than equal to 6.5 which by definition makes him diabetic. Nevertheless he is not on any treatment and does not check his blood  sugars. The patient is now back home out of the nursing home. Saw Dr. Algis Liming last week he was taken off vancomycin and ertapenem on August 22 and now is on doxycycline on Augmentin. He also saw Dr. Weston Anna who is his orthopedic surgeon in [place] he recommended a [employer]	[place]. He has been using Medihoney. 9/6I have been having trouble getting hyperbarics approved through our prior authorization process. Even though he had a limb threatening infection in the left foot and probably the left ankle there glitches in how some of the reports are worded also some of the consultants. In any case I am going to repeat his sedimentation rate and C-reactive protein. I am generally not in favor of doing things like this as they really do not alter the plan of care from my point of view however I am going to need to demonstrate that these remain high in order to get this through forhyperbaric treatment for chronic refractory osteomyelitis 9/13; following this man for a wound on his left plantar foot in the setting of type 2 diabetes and Charcot deformity. He has underlying chronic refractory osteomyelitis. Follow-up sedimentation rate and C-reactive protein were both elevated but the C-reactive protein was down to 1.4, sedimentation rate at 70. Sedimentation rate was only slightly down from previous at 85. His wound is measuring slightly smaller. 9/20; patient started hyperbaric oxygen therapy today and tolerated treatment well. This is for the underlying osteomyelitis. He remains on antibiotics but thinks he is getting close to finishing. The wound on the plantar aspect of his foot is the other issue we are following here. He is using Medihoney The patient has a Charcot foot in the setting of type 2 diabetes. He is going to need a total contact cast although his partner was away this week and we elected to delay this till next week 9/27; patient still tolerating hyperbaric oxygen well. Wound looked generally  healthy not much depth under illumination still 100% covered in fibrinous debris. Raised callused edges around the wound he was prepared for a total contact cast. We have been using Medihoney 9/30; patient is back for his first obligatory total contact cast change. We are using Hydrofera Blue. Noted by our intake nurse to have a lot of drainage or at least a moderate amount of drainage.  I am not sure I was previously aware of this 10/4; patient arrives today with a lot of drainage under the cast. When he had it changed last Friday there was also a similar amount of drainage. Her intake nurse says that they tried a wound VAC on him perhaps while I was on vacation in August under the cast but that did not work. In my experience that has not been unusual. We have been using Hydrofera Blue with all the secondary absorbers. The drainage today went right through all of our dressings. The patient is concerned about his foot being in a cast without much drainage. He is tolerating HBO well. There has been improvements in the wound in the mid part of his foot in the setting of a Charcot deformity 10/7; patient presents for cast change. He has no issues or complaints today. He denies signs of infection. 10/11; patient presents for cast change. At this time he would like to take a break from the cast. He would like to do daily dressing changes with Hydrofera Blue. He currently denies signs of infection. 10/14; his cast was taken off last week at his request. He arrives in the clinic with [facility]. He has been changing the dressing himself. He has way too much edema in the left foot and leg to consider a total contact cast. I do not really know the issue here. He does have chronic venous insufficiency His wife stopped me in the clinic earlier in the week to report he is drinking again and she is concerned. I am uncertain whether there are other issues 10/18; he arrived in clinic last week having bilateral  lower extremity edema likely secondary to chronic venous insufficiency there was too much edema in the left leg to apply a total contact cast. I put him in compression on the left leg to control the swelling. This week he cut the wrap on the foot for reasons that are unclear however today he arrives in clinic with a smaller left leg but massive edema in the left foot. He is supposed to be wearing a juxta lite stocking on the right leg but he is not wearing the contact layer. His attendance at hyperbaric oxygen has been dwindling, he did not dive yesterday and he did not dive today [name], [name] ([id number]) [id number] Page 4 of 13 concerned about hyperbarics causing swelling. I looked back in his record his last echo was in 2020 essentially normal left and right ventricular function. Last BUN and creatinine were done in July this was normal. He is having a lot of drainage in the left plantar foot wound 10/25; again he comes in today having missed HBO yesterday. Macerated skin around the wound which really does not look very good at all. A lot of edema in the left foot but an improvement in edema on the left leg. We wrapped him last week because of the amount of swelling in the left leg. We could not apply a total contact cast. The patient states that he wants to be able to change his dressing himself. I might consider this if he had stockings to control the swelling. He said he be here for HBO tomorrow. I will check the degree of erythema in his forefoot which I have marked. He is on doxycycline and Augmentin which was renewed by Dr. Algis Liming but I cannot see a follow-up note, follow-up inflammatory markers etc. I 1 point he said he was going to see his orthopedic surgeon in  [place] I am not sure if he ever did this. I do not know why he has not followed up with infectious disease, he says he was not given an appointment but he is still on Augmentin doxycycline 11/1; he did  not do the lab work I ordered last week. Still on Augmentin and doxycycline. Silver alginate and he is changing this daily using his own juxta lite stocking. The surface of the wound does not look too bad. No real epithelialization however. The patient was seen today along with HBO 08/26/2021 upon evaluation today patient is being seen at his request by myself he wanted to transfer care to see me. With that being said he has been seeing Dr. Leanord Hawking since he came back in August of this year. Unfortunately he tells me he still having quite a bit of drainage. He is also significant erythema and warmth noted of the foot as well. He has been hit or miss with regard to hyperbaric oxygen therapy tells me that at this point he took this week off because he was extremely claustrophobic and having a lot of issues he plans to start back next week. Nonetheless he has been given some medication by Dr. Leanord Hawking as well to help calm things down which again may help him. Hopefully he can get back in hyperbarics as I think this is likely necessary. Nonetheless there does seem to be evidence of cellulitis noted today as well and in general I am a little concerned in that regard. His wound unfortunately is significant on this left foot and is right where he takes the brunt of the force secondary to the Charcot arthropathy. I do not think a total contact cast is ideal due to the fact that he unfortunately is draining much too significantly he is also not happy with the idea of using a cast therefore he states he would not want to do that anyway. 09/16/2021 upon evaluation today patient's plantar foot ulcer unfortunately continues to show signs of issues here. He has been in the hospital due to trying to detox himself from alcohol he had withdrawal symptoms and subsequently was hospitalized. During that time it was recommended by both orthopedics as well as infectious disease apparently that he proceed with amputation. With that  being said they discussed with him the risk of not doing so. This is well- documented in the encounter. With that being said the patient does not want to proceed down that road and tells me that he declined their advice in that regard. Subsequently our goal then is to try to do what we can to try to get this thing healed and closed. I think this means he is getting need to stay off of it is much as possible he does have a wheelchair at home that he tells me he can use I think that that is going to be something that he does need to do. 09/23/2021 upon evaluation today patient continues to have significant issues here with his foot ulcer which is plantar on the left foot. Subsequently again he does have a history of Charcot foot he also has an issue of having had osteomyelitis as well as an open wound for some time here. Its been recommended multiple times for him to have an amputation although that is not something that he is really interested or wanting to do. 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 Subsequently this means that he also has a gait  abnormality that is causing issues with his walking and causing abnormal pressures in the central part of his foot this is the reason he has a wound. Nonetheless I want to see what I can do about trying to get a boot to help with stabilization and offloading of working to see what we can do in that regard. 09/30/2021 upon evaluation today patient appears to be doing well with regard to his plantar foot ulcer. He still has significant granular hypergranulation but again this is much less than what it was even last time I saw him last week. Fortunately I think we are headed in the right direction. This is definitely something we could consider a skin graft or something along those lines if we can get it flattened out enough and get the drainage from being so significant. I feel like we are making some headway  here. 10/07/2021 upon evaluation today patient appears to be doing decently well in regard to his wound. Fortunately there does not appear to be any signs of infection overall I feel like it is actually showing some signs of improvement. He still has some hypergranulation but this is much less than its been. 10/21/2021 upon evaluation today patient appears to be doing well with regard to his wound is actually showing some signs of improvement which is great. We have been doing the chemical cauterization with silver nitrate as well as debridement as needed and again has been using silver alginate up to this point. With that being said he does have some Hydrofera Blue at home he wonders if that can be beneficial at this point. 11/25/2021 upon evaluation today patient appears to be doing somewhat poorly in regard to his plantar foot ulcer. He is also been having some issues with his congestive heart failure. He has recently been in the hospital. I did review those notes as well and apparently his heart failure has been somewhat out of control. He is actually being managed as an outpatient in this regard but nonetheless this is something that can still be kept a close eye on according to the notes and what I see. Fortunately I do not see any signs of active infection at this time which is great news. Nonetheless I am concerned about the boggy central portion of the wound which I think is going to likely open up in the near future. Readmission: 01-27-2022 patient presents for follow-up here in the clinic today. His last been November 25, 2021 since have seen him. At that time we just ordered an MRI fortunately the MRI did not show any obvious signs of osteomyelitis and there were some reactive changes which were questionable and could not be excluded. With that being said in the interim since have seen him back on February 8 things have gotten worse from the standpoint of how the wound appears today. I do think  that he is going to require potentially more debridement to clean this wound out than what I can even offer here in the clinic there is a tremendous amount of hypergranulation tissue which is not sufficient to grow new tissue over and honestly I think this is going to lead to a delay in healing if its not taking care of. I need to see if I can get him into see a surgeon to get an opinion on whether or not they could take him to the OR for an aggressive surgical debridement to clear out this hypergranulation tissue and achieve hemostasis which is can be  the biggest issue with me here in the clinic as he does tend to bleed even with very light superficial debridements. The patient's not opposed to this and he states that if I have someone that like for him to see he would be happy to go. Otherwise his medical history really has not changed. He does tell me he is getting ready to go for an evaluation for CPAP machine. Readmission 05/04/2022 Patient was last seen 3 months ago. He states he went to rehab for alcohol abuse. He continues to have a wound on the plantar aspect of the left foot. He states this has never completely healed over the past several years. He is following with podiatry for this issue currently and they are recommending blast X with collagen and a cam boot. He has not started this yet. He has home health. Currently he is using silver alginate. He also follows with Dr. Algis Liming infectious disease who ordered an MRI of the foot as he is concerned for osteomyelitis. CRP and sed rate are elevated. He is currently on Augmentin for prophylaxis as he has a history of osteomyelitis to this foot. Infectious disease has recommended amputation. Currently patient denies systemic signs of infection. 8/7; patient is following up with podiatry on 8/11. He wanted to come in to be debrided. He has no issues or complaints today. He has been using silver alginate to the wound bed. He has home health that  helps change the dressings. He had an MRI completed on 7/18 that did not show evidence of osteomyelitis. He is scheduled to see infectious disease on 8/16. 8/25; Patient is following with podiatry for his wound care and he is currently using silver alginate and a defender boot for his Left foot wound. He would like to have debridement to the wound but is unable to see podiatry until the end of the month. He has asked if we could debride him. 9/21; patient follows with podiatry for his foot wound but likes to come into our clinic for debridements. He was last seen on 06/24/2022 by Dr. Allena Katz, podiatry. He is currently using blast X to the wound bed. He has been using a Psychologist, forensic. He is not scheduled to see podiatry until 10/11 and comes in for debridement today. 10/9; patient presents for follow-up. He states he is no longer following with podiatry. He has been doing silver alginate to the wound bed. He states he would like to attempt a total contact cast but starting next week. He currently denies systemic signs of infection. Electronic Signature(s) [name], [name] ([id number]) [id number] Page 5 of 13 Signed: 07/26/2022 12:26:27 PM By: [name] DO Entered By: [name] on 07/26/2022 11:57:18 -------------------------------------------------------------------------------- Physical Exam Details Patient Name: Date of Service: [name], [name] [name] 07/26/2022 11:00 A M Medical Record Number: [id number] Patient Account Number: [id number] Date of Birth/Sex: Treating RN: Aug 27, 1961 (61 y.o. M) Primary Care Provider: [name] Other Clinician: Referring Provider: Treating Provider/Extender: [name] in Treatment: 11 Constitutional respirations regular, non-labored and within target range for patient.. Cardiovascular 2+ dorsalis pedis/posterior tibialis pulses. Psychiatric pleasant and cooperative. Notes Left foot: T the plantar  aspect there is an open wound with pale granulation, Hyper granulated areas and callus. No surrounding signs of soft tissue infection. o Electronic Signature(s) Signed: 07/26/2022 12:26:27 PM By: [name] DO Entered By: [name] on 07/26/2022 11:57:52 -------------------------------------------------------------------------------- Physician Orders Details Patient Name: Date of Service: [name] 07/26/2022 11:00 A M Medical Record Number: [id number] Patient  Account Number: [id number] Date of Birth/Sex: Treating RN: 1960-12-26 (61 y.o. Marcheta Grammes Primary Care Provider: Billey Gosling Other Clinician: Referring Provider: Treating Provider/Extender: Mickle Asper in Treatment: 68 Verbal / Phone Orders: No Diagnosis Coding ICD-10 Coding Code Description E11.621 Type 2 diabetes mellitus with foot ulcer L97.522 Non-pressure chronic ulcer of other part of left foot with fat layer exposed M86.372 Chronic multifocal osteomyelitis, left ankle and foot M14.672 Charcot's joint, left ankle and foot Follow-up Appointments ppointment in 1 week. - with Dr. Heber [place] and appt 2 days after for cast change Return A Other: - *Plan for TCC next week* Anesthetic (In clinic) Topical Lidocaine 5% applied to wound bed (In clinic) Topical Lidocaine 4% applied to wound bed Cellular or Tissue Based Products Cellular or Tissue Based Product Type: - Run IVR for [name], [name] ([id number]) [id number] Page 6 of 13 Bathing/ Shower/ Hygiene May shower and wash wound with soap and water. - prior to dressing change Edema Control - Lymphedema / SCD / Other Elevate legs to the level of the heart or above for 30 minutes daily and/or when sitting, a frequency of: - throughout the day Avoid standing for long periods of time. Exercise regularly Moisturize legs daily. Compression stocking or Garment 30-40 mm/Hg pressure to: - wear the  Juxtalite HD to right and left leg. apply in the morning and remove at night. Off-Loading Other: - Defender Boot: minimal weight bearing left foot use wheelchair for mobility. Additional Orders / Instructions Follow Nutritious Diet Wound Treatment Wound #5 - Foot Wound Laterality: Plantar, Left Cleanser: Wound Cleanser 1 x Per Day/30 Days Discharge Instructions: Cleanse the wound with wound cleanser prior to applying a clean dressing using gauze sponges, not tissue or cotton balls. Prim Dressing: KerraCel Ag Gelling Fiber Dressing, 2x2 in (silver alginate) 1 x Per Day/30 Days ary Discharge Instructions: Apply silver alginate to wound bed as instructed Secondary Dressing: ABD Pad, 5x9 ([place]) 1 x Per Day/30 Days Discharge Instructions: Apply over primary dressing as directed. Secondary Dressing: Drawtex 4x4 in 1 x Per Day/30 Days Discharge Instructions: cut to into wound bed over the alginate. Secured With: Elastic Bandage 4 inch (ACE bandage) (Home Health) 1 x Per Day/30 Days Discharge Instructions: Secure with ACE bandage. Secured With: [employer], 4.5x3.1 (in/yd) (Home Health) 1 x Per Day/30 Days Discharge Instructions: Secure with Kerlix as directed. Secured With: Paper Tape, 2x10 (in/yd) (Home Health) 1 x Per Day/30 Days Discharge Instructions: Secure dressing with tape as directed. Electronic Signature(s) Signed: 07/26/2022 12:26:27 PM By: Kalman Shan DO Entered By: Kalman Shan on 07/26/2022 11:58:01 -------------------------------------------------------------------------------- Problem List Details Patient Name: Date of Service: [name] [name] 07/26/2022 11:00 A M Medical Record Number: [id number] Patient Account Number: [id number] Date of Birth/Sex: Treating RN: [birth year] (61 y.o. Marcheta Grammes Primary Care Provider: Billey Gosling Other Clinician: Referring Provider: Treating Provider/Extender: Mickle Asper in Treatment:  11 Active Problems ICD-10 Encounter Code Description Active Date MDM Diagnosis E11.621 Type 2 diabetes mellitus with foot ulcer 05/04/2022 No Yes L97.522 Non-pressure chronic ulcer of other part of left foot with fat layer exposed 05/04/2022 No Yes [name], [name] ([id number]) [id number] Page 7 of 13 [phone] Chronic multifocal osteomyelitis, left ankle and foot 05/04/2022 No Yes M14.672 Charcot's joint, left ankle and foot 05/04/2022 No Yes Inactive Problems Resolved Problems Electronic Signature(s) Signed: 07/26/2022 12:26:27 PM By: Kalman Shan DO Entered By: Kalman Shan on 07/26/2022 11:55:03 -------------------------------------------------------------------------------- Progress Note Details Patient Name: Date of Service: [name],  [name] 07/26/2022 11:00 A M Medical Record Number: [id number] Patient Account Number: [id number] Date of Birth/Sex: Treating RN: July 22, 1961 (61 y.o. M) Primary Care Provider: [name] Other Clinician: Referring Provider: Treating Provider/Extender: [name] in Treatment: 11 Subjective Chief Complaint Information obtained from Patient Left foot ulcer History of Present Illness (HPI) 10/31/2019 upon evaluation today patient appears to be doing somewhat poorly in regard to his bilateral plantar feet. He has wounds that he tells me have been present since 2012 intermittently off and on. Most recently this has been open for at least the past 6 months to a year. He has been trying to treat this in different ways using Santyl along with various other dressings including Medihoney and even at one point Xeroform. Nothing really has seem to get this completely closed. He was recently in the hospital for cellulitis of his leg subsequently he did have x-rays as well as MRIs that showed negative for any signs of osteomyelitis in regard to the wounds on his feet. Fortunately there is no signs of systemic  infection at this time. No fevers, chills, nausea, vomiting, or diarrhea. Patient has previously used Darco offloading shoes as far as frontal floaters as well as postop surgical shoes. He has never been in a total contact cast that may be something we need to strongly consider here. Patient's most recent hemoglobin A1c 1 month ago was 5.3 seems to be very well controlled which is great. Subsequently he has seen vascular as well as podiatry. His ABIs are 1.07 on the left and 1.14 on the right he seems to be doing well he does have chronic venous stasis. 11/07/2019 upon evaluation today patient appears to be doing well with regard to his wounds all things considered. I do not see any severe worsening he still has some callus buildup on the right more than the left he notes that he has been probably more active than he should as far as walking is concerned is just very hard to not be active. He knows he needs to be more careful in this regard however. He is willing to give the cast a try at this point although he notes that he is a little nervous about this just with regard to balance although he will be very careful and obviously if he has any trouble he knows to contact the office and let me know. 1/22; patient is in for his obligatory first total contact cast change. Our intake nurse reported a very large amount of drainage which is spelled out over to the surrounding skin. Has bilateral diabetic foot wounds. He has Charcot feet. We have been using silver alginate on his wounds. 11/14/2019 on evaluation today patient is actually seeming to make good improvement in regard to his bilateral plantar foot wounds. We have been using a cast on the left side and on the right side he has been using dressings he is changing up his own accord. With that being said he tells me that he is also not walking as much just due to how unsteady he feels. He takes it easy when he does have to walk and when he does not have  to walk he is resting. This is probably help in his right foot as well has the left foot which is actually measuring better. In fact both are measuring better. Overall I am very pleased with how things seem to be progressing. The patient does have some odor on the left foot this  does have me concerned about the possibility of infection, and actually probably go ahead and put him on antibiotics today as well as utilizing a continuation of the cast on the left foot I think that will be fine we probably just need to bring him in sooner to change this not last a whole week. 1/29; we brought the patient back today for a total contact cast change on the left out of concern for excessive drainage. We are using drawtex over the wound as the primary dressing 11/21/2019 on evaluation today patient appears to be doing well with regard to his left plantar foot. In fact both foot ulcers actually seem to be doing pretty well. Nonetheless he is having a lot of drainage on the left at this time and again we did obtain a wound culture did show positive for Staph aureus that was reviewed by myself today as well. Nonetheless he is on Bactrim which was shown to be sensitive that should be helping in this regard. Fortunately there is no signs of infection systemically at this point. 2/5; back in clinic today for a total contact cast change apparently secondary to very significant drainage. Still using drawtex 11/28/2019 upon evaluation today patient appears to be doing well with regard to his wounds. The right foot is doing okay as measured about the same in my opinion. The left foot is actually showing signs of significant improvement is measuring smaller there is a lot of hyper granulation likely due to the continued drainage at this point. We did obtain approval for a snap VAC I think that is good to be appropriate for him and will likely help this tremendously underneath the cast. He is definitely in agreement with  proceeding with such. 2/12; patient came in today after his snap VAC lost suction. Brought in to see one of our nurses. The dressing was replaced and then we put the cast back on and rehooked up the snap VAC. Apparently his wound looked very good per our intake nurse. 2/15; again we replaced the cast on Friday. By Saturday the snap VAC and light suction. He called this morning he comes in acutely. The wounds look fine [name], [name] ([id number]) [id number] Page 8 of 13 however the VAC is not functioning. We replaced the cast using silver alginate as the primary dressing backed with Kerramax. The snap VAC was not replaced 12/05/2019 upon evaluation today patient appears to be doing better in regard to his left plantar foot ulcer. Fortunately there is no signs of active infection at this time. Unfortunately he is continuing to have issues with the right foot he is really not making any progress here things seem to be somewhat stagnant to be honest. The depth has increased but that is due to me having debrided the wound in the past based on what I am seeing. 12/12/2019 on evaluation today patient appears to be doing more poorly in regard to the left lower extremity. He has some erythema spreading up the side of his foot I am concerned about infection again at this point. Unfortunately he has been seeing improvement with a total contact cast but I do not think we should put that on today. On his right plantar foot he continues to have significant drainage this is actually measuring deeper I really do not feel like you are making any progress whatsoever. I have prescribed Granix for him unfortunately his insurance apparently was going to cost him a $500 co-pay. 12/19/2019 upon evaluation today patient actually appears  to be doing better in regard to both wounds. With that being said he actually did get the reGranix which he had to pay $500 for. With that being said it does look like  that he is actually made some improvement based on what I am seeing at this point with the reGranix. Obviously if he is going to continue this we are going to do something about trying to get him some help in covering the cost. 12/26/2019 on evaluation today patient appears to be doing really much better even compared to last week. Overall the wound seems to be much better even compared to last week and last week was better than the week before. Since has been using the reGranix his symptoms have improved significantly. With that being said the issue right now is simply that this is a very expensive medication for him the first dose cost him $500. Upon inspection patient's wound bed actually is however dramatically improved compared to before he started this 2 weeks ago. 01/02/2020 upon evaluation today patient appears to actually be doing well. He still had a little bit of reGranix left that has been using in small amounts he just been applying it every other day instead of every day in order to make it go longer. Overall we are still seeing excellent improvement he is measuring smaller looking better healthier tissue and everything seems to be pointing to this headed in the right direction. Fortunately there is no evidence of infection either which is also excellent news. He does have his MRI coming up within the next week. 01/09/2020 upon evaluation today patient appears to be doing a little worse today compared to previous week's evaluation. He is actually been out of the reGranix at this point. He has been trying to make the stretch out so he has been changing the dressings on a regular set schedule like he was previous. I think this has made a difference. Fortunately there is no signs of active infection at this time. No fevers, chills, nausea, vomiting, or diarrhea. 01/16/2020 upon evaluation today patient appears to be doing well with regard to his left plantar foot ulcer. The right plantar foot  still shows some significant depth at this point. Fortunately there is no signs of active infection at this time. 01/30/2020 upon evaluation today patient appears to be doing about the same in regard to his right plantar foot ulcer there is still some depth here and we had to wait till he actually switched over to his new insurance to get approval for the MRI under his new insurance plan. With that being said he now has switched as of April 1. Fortunately there is no signs of active infection at this time. Overall in regard to his left foot ulcer this seems to be doing much better and I am actually very pleased with how things are going. With that being said it is not quite as much progress as we were seeing with the reGranix but at the same time he has had trouble getting this apparently there is been some hindrance here. I Ernie Hew try to actually send this to [facility] that was recommended by the drug rep to me. 02/13/20 upon evaluation today patient appears to be doing better in regard to his left foot ulcer this is great news. Unfortunately the right foot ulcer is not really significantly better at this time. There is no signs of active infection systemically though he did have his MRI which showed unfortunately he does  have infection noted including an abscess in the foot. There is also marrow changes noted which are consistent with osteomyelitis based on the radiology review and interpretation. Unfortunately considering that the wound is not really making the progress that we will he would like to be seen I think that this is an indication that he may need some further referral both infectious disease as well as potentially to podiatrist to see if there is anything that can be done to help with the situation that were dealing with here. The left foot again is doing great. READMISSION 06/04/2021 This is a 61 year old man who was in the clinic in 2021 followed by Allen Derry for areas on the  right and left foot. He developed a left foot infection and was referred to ID. He left the clinic in a nonhealed state and was followed for a period of [facility] Marylene Land. Apparently things really deteriorated in early July when he was admitted to hospital from 04/27/2021 through 05/06/2021 with sepsis secondary to a left foot infection. His blood cultures were negative. An MRI suggested fifth metatarsal osteomyelitis a left ankle septic joint. He was treated with vancomycin and ertapenem which he is still taking and may just about be finishing. He was seen by orthopedics and the patient adamantly refused to BKA. As far as I can tell he did not have the ankle aspirated I am not exactly sure what the issue was here. Since he has been discharged she is at [facility] health care for rehabilitation. He was last seen by Dr. Algis Liming on 05/20/2021 he noted osteo of the tibial talar bone cuboid and fifth metatarsal which is even more extensive than what was suggested by the MRI. He is apparently going for a consultation with orthopedic surgery in [place] sometime next week. I received a call about this man 2 weeks ago from Dr. Allyson Sabal who follows him for the possibility of PAD. ABIs I think done in the office showed a ABI on the right of 1.05 at the PTA and 0.99 at the PTA on the left. He had a DVT rule out in the left leg that was negative for the DVT. Past medical history is extensive and includes diastolic heart failure, right first metatarsal head ulcer in 2021, excision of the right second ray by Dr. Marylene Land on 03/13/2020, hypertension,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 [facility] x2 months 04/26/2022 Family and Social History Unknown History: Yes; Former smoker - quit 2016; Marital Status - Divorced; Alcohol Use: Rarely - history of alcoholism-sober x2 months; Drug Use: Current History - Marijuana; Caffeine Use: Moderate; Financial Concerns: No; Food, Clothing or Shelter Needs: No; Support System Lacking: No; Transportation Concerns: No Electronic Signature(s) Signed: 07/26/2022 12:26:27 PM By: [name] DO Entered By: [name] on 07/26/2022 11:57:24 -------------------------------------------------------------------------------- SuperBill Details Patient Name: Date of Service: [name] 07/26/2022 Medical Record Number: [id number] Patient Account Number: [id number] Date of Birth/Sex: Treating RN: Oct 07, 1961 (61 y.o. [name] Primary Care Provider: [name] Other Clinician: Referring Provider: Treating Provider/Extender: [name] in Treatment: 11 Diagnosis Coding ICD-10 Codes Code Description E11.621 Type 2 diabetes mellitus with foot ulcer L97.522 Non-pressure chronic ulcer of other part of left foot with fat layer exposed M86.372 Chronic multifocal osteomyelitis, left ankle and foot M14.672 Charcot's joint, left ankle and foot Facility Procedures : CPT4 Code: [id number] Description: 11042 - DEB SUBQ TISSUE 20 SQ CM/< ICD-10 Diagnosis Description L97.522 Non-pressure chronic ulcer of other part of left foot with fat layer exposed E11.621 Type 2 diabetes mellitus  with foot ulcer Modifier: Quantity: 1 Physician Procedures : CPT4 Code Description Modifier [id number] 11042 - WC PHYS SUBQ TISS 20 SQ CM ICD-10 Diagnosis Description L97.522 Non-pressure chronic ulcer of other part of left foot with fat layer exposed E11.621 Type 2 diabetes mellitus with foot ulcer Quantity: 1 Electronic Signature(s) Signed: 07/26/2022 12:26:27 PM By: [name] DO Entered By: [name] on 07/26/2022 12:05:23

## 2022-07-26 NOTE — Progress Notes (Signed)
[name], [name] ([id number]) [id number] Page 1 of 8 Visit Report for 07/26/2022 Arrival Information Details Patient Name: Date of Service: [name] 07/26/2022 11:00 A M Medical Record Number: [id number] Patient Account Number: [id number] Date of Birth/Sex: Treating RN: 08/30/1961 (61 y.o. [name] Primary Care [name]: [name] Other Clinician: Referring [name]: Treating [name]/Extender: [name] in Treatment: 11 Visit Information History Since Last Visit Added or deleted any medications: No Patient Arrived: [name] Any new allergies or adverse reactions: No Arrival Time: 11:09 Had a fall or experienced change in No Transfer Assistance: None activities of daily living that may affect Patient Identification Verified: Yes risk of falls: Secondary Verification Process Completed: Yes Signs or symptoms of abuse/neglect since last No Patient Requires Transmission-Based Precautions: No visito Patient Has Alerts: No Hospitalized since last visit: No Implantable device outside of the clinic No excluding cellular tissue based products placed in the center since last visit: Has Dressing in Place as Prescribed: Yes Has Footwear/Offloading in Place as Yes Prescribed: Left: Removable Cast Walker/Walking Boot Pain Present Now: No Electronic Signature(s) Signed: 07/26/2022 4:35:51 PM By: [name] Entered By: [name] on 07/26/2022 11:13:27 -------------------------------------------------------------------------------- Encounter Discharge Information Details Patient Name: Date of Service: [name] 07/26/2022 11:00 A M Medical Record Number: [id number] Patient Account Number: [id number] Date of Birth/Sex: Treating RN: October 26, 1960 (61 y.o. [name] Primary Care Rickie Gange: [name] Other Clinician: Referring Sanayah Munro: Treating [name]/Extender: [name] in  Treatment: 11 Encounter Discharge Information Items Post Procedure Vitals Discharge Condition: Stable Temperature (F): 98.1 Ambulatory Status: Cane Pulse (bpm): 96 Discharge Destination: Home Respiratory Rate (breaths/min): 18 Transportation: Private Auto Blood Pressure (mmHg): 147/91 Schedule Follow-up Appointment: Yes Clinical Summary of Care: Provided on 07/26/2022 Form Type Recipient Paper Patient Patient Electronic Signature(s) Signed: 07/26/2022 4:35:51 PM By: [name] Entered By: [name] on 07/26/2022 11:47:44 [name] ([id number]) [id number] Page 2 of 8 -------------------------------------------------------------------------------- Lower Extremity Assessment Details Patient Name: Date of Service: [name] 07/26/2022 11:00 A M Medical Record Number: [id number] Patient Account Number: [id number] Date of Birth/Sex: Treating RN: [birth year] (61 y.o. [name] Primary Care [name]: [name] Other Clinician: Referring [name]: Treating [name]/Extender: [name] Weeks in Treatment: 11 Edema Assessment Assessed: [Left: Yes] [Right: No] Edema: [Left: N] [Right: o] Calf Left: Right: Point of Measurement: 37 cm From Medial Instep 38 cm Ankle Left: Right: Point of Measurement: 11 cm From Medial Instep 25 cm Vascular Assessment Pulses: Dorsalis Pedis Palpable: [Left:Yes] Electronic Signature(s) Signed: 07/26/2022 4:35:51 PM By: [name] Entered By: [name] on 07/26/2022 11:20:05 -------------------------------------------------------------------------------- Multi Wound Chart Details Patient Name: Date of Service: [name] 07/26/2022 11:00 A M Medical Record Number: [id number] Patient Account Number: [id number] Date of Birth/Sex: Treating RN: 07-Nov-1960 (61 y.o. M) Primary Care Melanny Wire: [name] Other Clinician: Referring [name]: Treating [name]/Extender:  [name] in Treatment: 11 Vital Signs Height(in): 73 Pulse(bpm): 96 Weight(lbs): 237 Blood Pressure(mmHg): 147/91 Body Mass Index(BMI): 31.3 Temperature(F): 98.1 Respiratory Rate(breaths/min): 18 [5:Photos:] [N/A:N/A] Left, Plantar Foot N/A N/A Wound Location: Gradually Appeared N/A N/A Wounding Event: Diabetic Wound/Ulcer of the Lower N/A N/A Primary Etiology: Extremity Anemia, Sleep Apnea, Congestive N/A N/A Comorbid History: Heart Failure, Hypertension, Peripheral Venous Disease, Cirrhosis , Type II Diabetes, Osteoarthritis, Osteomyelitis, Neuropathy 10/19/2019 N/A N/A Date Acquired: 11 N/A N/A Weeks of Treatment: Open N/A N/A Wound Status: No N/A N/A Wound Recurrence: 3.8x3.5x0.4 N/A N/A Measurements L x W x D (cm) 10.446  N/A N/A A (cm) : rea 4.178 N/A N/A Volume (cm) : -151.40% N/A N/A % Reduction in A rea: -906.70% N/A N/A % Reduction in Volume: Grade 2 N/A N/A Classification: Medium N/A N/A Exudate A mount: Serosanguineous N/A N/A Exudate Type: red, brown N/A N/A Exudate Color: Thickened N/A N/A Wound Margin: Large (67-100%) N/A N/A Granulation A mount: Red, Pink, Hyper-granulation, Friable N/A N/A Granulation Quality: Small (1-33%) N/A N/A Necrotic A mount: Fat Layer (Subcutaneous Tissue): Yes N/A N/A Exposed Structures: Fascia: No Tendon: No Muscle: No Joint: No Bone: No None N/A N/A Epithelialization: Debridement - Excisional N/A N/A Debridement: Pre-procedure Verification/Time Out 11:34 N/A N/A Taken: Callus, Subcutaneous N/A N/A Tissue Debrided: Skin/Subcutaneous Tissue N/A N/A Level: 13.3 N/A N/A Debridement A (sq cm): rea Curette, Other(Silver Nitrate) N/A N/A Instrument: Moderate N/A N/A Bleeding: Pressure N/A N/A Hemostasis A chieved: Procedure was tolerated well N/A N/A Debridement Treatment Response: 3.8x3.5x0.4 N/A N/A Post Debridement Measurements L x W x D (cm) 4.178 N/A  N/A Post Debridement Volume: (cm) Excoriation: No N/A N/A Periwound Skin Texture: Induration: No Callus: No Crepitus: No Rash: No Scarring: No Maceration: No N/A N/A Periwound Skin Moisture: Dry/Scaly: No Atrophie Blanche: No N/A N/A Periwound Skin Color: Cyanosis: No Ecchymosis: No Erythema: No Hemosiderin Staining: No Mottled: No Pallor: No Rubor: No No Abnormality N/A N/A Temperature: Debridement N/A N/A Procedures Performed: Treatment Notes Wound #5 (Foot) Wound Laterality: Plantar, Left Cleanser Wound Cleanser Discharge Instruction: Cleanse the wound with wound cleanser prior to applying a clean dressing using gauze sponges, not tissue or cotton balls. Peri-Wound Care Topical Primary Dressing KerraCel Ag Gelling Fiber Dressing, 2x2 in (silver alginate) Discharge Instruction: Apply silver alginate to wound bed as instructed Secondary Dressing LEVELLE, EDELEN ([id number]) [id number] Page 4 of 8 ABD Pad, 5x9 Discharge Instruction: Apply over primary dressing as directed. Drawtex 4x4 in Discharge Instruction: cut to into wound bed over the alginate. Secured With Elastic Bandage 4 inch (ACE bandage) Discharge Instruction: Secure with ACE bandage. Kerlix Roll Sterile, 4.5x3.1 (in/yd) Discharge Instruction: Secure with Kerlix as directed. Paper Tape, 2x10 (in/yd) Discharge Instruction: Secure dressing with tape as directed. Compression Wrap Compression Stockings Add-Ons Electronic Signature(s) Signed: 07/26/2022 12:26:27 PM By: [name] DO Entered By: [name] on 07/26/2022 11:55:09 -------------------------------------------------------------------------------- Multi-Disciplinary Care Plan Details Patient Name: Date of Service: AMELIO, BROSKY [name] 07/26/2022 11:00 A M Medical Record Number: [id number] Patient Account Number: [id number] Date of Birth/Sex: Treating RN: March 24, 1961 (61 y.o. [name] Primary Care  Eyana Stolze: [name] Other Clinician: Referring Shiquita Collignon: Treating Nycole Kawahara/Extender: [name] in Treatment: 11 Active Inactive Necrotic Tissue Nursing Diagnoses: Impaired tissue integrity related to necrotic/devitalized tissue Goals: Necrotic/devitalized tissue will be minimized in the wound bed Date Initiated: 06/11/2022 Target Resolution Date: 08/12/2022 Goal Status: Active Patient/caregiver will verbalize understanding of reason and process for debridement of necrotic tissue Date Initiated: 06/11/2022 Target Resolution Date: 08/12/2022 Goal Status: Active Interventions: Assess patient pain level pre-, during and post procedure and prior to discharge Treatment Activities: Apply topical anesthetic as ordered : 06/11/2022 Excisional debridement : 06/11/2022 Notes: Electronic Signature(s) Signed: 07/26/2022 4:35:51 PM By: [name] Entered By: [name] on 07/26/2022 11:09:36 Roepke, [name] ([id number]) [id number] Page 5 of 8 -------------------------------------------------------------------------------- Pain Assessment Details Patient Name: Date of Service: DORYAN, BAHL 07/26/2022 11:00 A M Medical Record Number: [id number] Patient Account Number: [id number] Date of Birth/Sex: Treating RN: 1960-12-18 (61 y.o. [name] Primary Care Tevis Conger: [name] Other Clinician: Referring Emmaly Leech: Treating Dejah Droessler/Extender: Lewis Moccasin,  Stacy Weeks in Treatment: 11 Active Problems Location of Pain Severity and Description of Pain Patient Has Paino No Site Locations Pain Management and Medication Current Pain Management: Electronic Signature(s) Signed: 07/26/2022 4:35:51 PM By: Lorrin Jackson Entered By: Lorrin Jackson on 07/26/2022 11:17:04 -------------------------------------------------------------------------------- Patient/Caregiver Education Details Patient Name: Date of Service: Emelia Salisbury 10/9/2023andnbsp11:00 A M Medical Record Number: [id number] Patient Account Number: [id number] Date of Birth/Gender: Treating RN: Mar 28, 1961 (61 y.o. Marcheta Grammes Primary Care Physician: Billey Gosling Other Clinician: Referring Physician: Treating Physician/Extender: Mickle Asper in Treatment: 11 Education Assessment Education Provided To: Patient Education Topics Provided Offloading: Methods: Explain/Verbal, Printed Responses: State content correctly [place], [name] ([id number]) [id number] Page 6 of 8 Wound/Skin Impairment: Methods: Explain/Verbal, Printed Responses: State content correctly Electronic Signature(s) Signed: 07/26/2022 4:35:51 PM By: Lorrin Jackson Entered By: Lorrin Jackson on 07/26/2022 11:09:56 -------------------------------------------------------------------------------- Wound Assessment Details Patient Name: Date of Service: [name], THEIL [name] 07/26/2022 11:00 A M Medical Record Number: [id number] Patient Account Number: [id number] Date of Birth/Sex: Treating RN: [birth year] (61 y.o. Marcheta Grammes Primary Care Mahogani Holohan: Billey Gosling Other Clinician: Referring Cathryn Gallery: Treating Aaryav Hopfensperger/Extender: Lucile Crater Weeks in Treatment: 11 Wound Status Wound Number: 5 Primary Diabetic Wound/Ulcer of the Lower Extremity Etiology: Wound Location: Left, Plantar Foot Wound Open Wounding Event: Gradually Appeared Status: Date Acquired: 10/19/2019 Comorbid Anemia, Sleep Apnea, Congestive Heart Failure, Hypertension, Weeks Of Treatment: 11 History: Peripheral Venous Disease, Cirrhosis , Type II Diabetes, Clustered Wound: No Osteoarthritis, Osteomyelitis, Neuropathy Photos Wound Measurements Length: (cm) 3.8 Width: (cm) 3.5 Depth: (cm) 0.4 Area: (cm) 10.446 Volume: (cm) 4.178 % Reduction in Area: -151.4% % Reduction in Volume: -906.7% Epithelialization: None Tunneling: No Undermining:  No Wound Description Classification: Grade 2 Wound Margin: Thickened Exudate Amount: Medium Exudate Type: Serosanguineous Exudate Color: red, brown Foul Odor After Cleansing: No Slough/Fibrino Yes Wound Bed Granulation Amount: Large (67-100%) Exposed Structure Granulation Quality: Red, Pink, Hyper-granulation, Friable Fascia Exposed: No Necrotic Amount: Small (1-33%) Fat Layer (Subcutaneous Tissue) Exposed: Yes Necrotic Quality: Adherent Slough Tendon Exposed: No Muscle Exposed: No Joint Exposed: No Bone Exposed: No [address] PERUSKI, Gillian ([id number]) [id number] Page 7 of 8 Texture Color No Abnormalities Noted: Yes No Abnormalities Noted: No Atrophie Blanche: No Moisture Cyanosis: No No Abnormalities Noted: Yes Ecchymosis: No Erythema: No Hemosiderin Staining: No Mottled: No Pallor: No Rubor: No Temperature / Pain Temperature: No Abnormality Treatment Notes Wound #5 (Foot) Wound Laterality: Plantar, Left Cleanser Wound Cleanser Discharge Instruction: Cleanse the wound with wound cleanser prior to applying a clean dressing using gauze sponges, not tissue or cotton balls. Peri-Wound Care Topical Primary Dressing KerraCel Ag Gelling Fiber Dressing, 2x2 in (silver alginate) Discharge Instruction: Apply silver alginate to wound bed as instructed Secondary Dressing ABD Pad, 5x9 Discharge Instruction: Apply over primary dressing as directed. Drawtex 4x4 in Discharge Instruction: cut to into wound bed over the alginate. Secured With Elastic Bandage 4 inch (ACE bandage) Discharge Instruction: Secure with ACE bandage. Kerlix Roll Sterile, 4.5x3.1 (in/yd) Discharge Instruction: Secure with Kerlix as directed. Paper Tape, 2x10 (in/yd) Discharge Instruction: Secure dressing with tape as directed. Compression Wrap Compression Stockings Add-Ons Electronic Signature(s) Signed: 07/26/2022 4:35:51 PM By: Lorrin Jackson Entered By:  Lorrin Jackson on 07/26/2022 11:22:05 -------------------------------------------------------------------------------- Vitals Details Patient Name: Date of Service: [name] [name] 07/26/2022 11:00 A M Medical Record Number: [id number] Patient Account Number: [id number] Date of Birth/Sex: Treating RN: 04-28-1961 (61 y.o. Marcheta Grammes Primary Care Janeil Schexnayder: Billey Gosling Other Clinician: Referring Essance Gatti: Treating Eltha Tingley/Extender: [place],  Hoyle Sauer, Stacy Weeks in Treatment: 11 Vital Signs Time Taken: 11:13 Temperature (F): 98.1 Height (in): 73 Pulse (bpm): 96 Weight (lbs): 237 Respiratory Rate (breaths/min): 18 Mccombie, Keeshawn ([id number]) [id number] Page 8 of 8 Body Mass Index (BMI): 31.3 Blood Pressure (mmHg): 147/91 Reference Range: 80 - 120 mg / dl Electronic Signature(s) Signed: 07/26/2022 4:35:51 PM By: [name] Entered By: [name] on 07/26/2022 11:13:49

## 2022-07-28 ENCOUNTER — Ambulatory Visit: Payer: Medicare Other | Admitting: Podiatry

## 2022-07-30 ENCOUNTER — Encounter: Payer: Self-pay | Admitting: Internal Medicine

## 2022-08-02 ENCOUNTER — Encounter (HOSPITAL_BASED_OUTPATIENT_CLINIC_OR_DEPARTMENT_OTHER): Payer: Medicare Other | Admitting: Internal Medicine

## 2022-08-02 DIAGNOSIS — L97522 Non-pressure chronic ulcer of other part of left foot with fat layer exposed: Secondary | ICD-10-CM | POA: Diagnosis not present

## 2022-08-02 DIAGNOSIS — E11621 Type 2 diabetes mellitus with foot ulcer: Secondary | ICD-10-CM

## 2022-08-02 NOTE — Progress Notes (Signed)
DAIVEON, MARKMAN ([id number]) [id number] Page 1 of 14 Visit Report for 08/02/2022 Chief Complaint Document Details Patient Name: Date of Service: [name], [name] 08/02/2022 12:30 PM Medical Record Number: [id number] Patient Account Number: [id number] Date of Birth/Sex: Treating RN: 08-26-1961 (61 y.o. [name] Primary Care Provider: [name] Other Clinician: Referring Provider: Treating Provider/Extender: Morley Kos Weeks in Treatment: 12 Information Obtained from: Patient Chief Complaint Left foot ulcer Electronic Signature(s) Signed: 08/02/2022 3:56:07 PM By: [name] DO Entered By: [name] on 08/02/2022 13:14:56 -------------------------------------------------------------------------------- Debridement Details Patient Name: Date of Service: [name], [name] 08/02/2022 12:30 PM Medical Record Number: [id number] Patient Account Number: [id number] Date of Birth/Sex: Treating RN: [birth year] (61 y.o. [name] Primary Care Provider: [name] Other Clinician: Referring Provider: Treating Provider/Extender: [name] in Treatment: 12 Debridement Performed for Assessment: Wound #5 Left,Plantar Foot Performed By: Physician [name], DO Debridement Type: Debridement Severity of Tissue Pre Debridement: Fat layer exposed Level of Consciousness (Pre-procedure): Awake and Alert Pre-procedure Verification/Time Out Yes - 12:59 Taken: Start Time: 13:00 T Area Debrided (L x W): otal 3.4 (cm) x 3.5 (cm) = 11.9 (cm) Tissue and other material debrided: Non-Viable, Subcutaneous, Hyper-granulation Level: Skin/Subcutaneous Tissue Debridement Description: Excisional Instrument: Curette, Other : Silver Nitrate Bleeding: Minimum Hemostasis Achieved: Pressure End Time: 13:06 Response to Treatment: Procedure was tolerated well Level of Consciousness (Post- Awake and  Alert procedure): Post Debridement Measurements of Total Wound Length: (cm) 3.4 Width: (cm) 3.5 Depth: (cm) 0.4 Volume: (cm) 3.738 Character of Wound/Ulcer Post Debridement: Stable Severity of Tissue Post Debridement: Fat layer exposed Post Procedure Diagnosis Same as Pre-procedure [name], [name] ([id number]) [id number] Page 2 of 14 Notes Procedure scribed for Dr. Mikey Bussing by Gardiner Fanti, RN Electronic Signature(s) Signed: 08/02/2022 3:56:07 PM By: [name] DO Signed: 08/02/2022 1:50:52 PM By: [name] Entered By: [name] on 08/02/2022 13:08:01 -------------------------------------------------------------------------------- HPI Details Patient Name: Date of Service: [name] 08/02/2022 12:30 PM Medical Record Number: [id number] Patient Account Number: [id number] Date of Birth/Sex: Treating RN: September 04, 1961 (61 y.o. [name] Primary Care Provider: [name] Other Clinician: Referring Provider: Treating Provider/Extender: [name] in Treatment: 12 History of Present Illness HPI Description: 10/31/2019 upon evaluation today patient appears to be doing somewhat poorly in regard to his bilateral plantar feet. He has wounds that he tells me have been present since 2012 intermittently off and on. Most recently this has been open for at least the past 6 months to a year. He has been trying to treat this in different ways using Santyl along with various other dressings including Medihoney and even at one point Xeroform. Nothing really has seem to get this completely closed. He was recently in the hospital for cellulitis of his leg subsequently he did have x-rays as well as MRIs that showed negative for any signs of osteomyelitis in regard to the wounds on his feet. Fortunately there is no signs of systemic infection at this time. No fevers, chills, nausea, vomiting, or diarrhea. Patient has previously  used Darco offloading shoes as far as frontal floaters as well as postop surgical shoes. He has never been in a total contact cast that may be something we need to strongly consider here. Patient's most recent hemoglobin A1c 1 month ago was 5.3 seems to be very well controlled which is great. Subsequently he has seen vascular as well as podiatry. His ABIs are 1.07 on the left and 1.14 on the right he seems  to be doing well he does have chronic venous stasis. 11/07/2019 upon evaluation today patient appears to be doing well with regard to his wounds all things considered. I do not see any severe worsening he still has some callus buildup on the right more than the left he notes that he has been probably more active than he should as far as walking is concerned is just very hard to not be active. He knows he needs to be more careful in this regard however. He is willing to give the cast a try at this point although he notes that he is a little nervous about this just with regard to balance although he will be very careful and obviously if he has any trouble he knows to contact the office and let me know. 1/22; patient is in for his obligatory first total contact cast change. Our intake nurse reported a very large amount of drainage which is spelled out over to the surrounding skin. Has bilateral diabetic foot wounds. He has Charcot feet. We have been using silver alginate on his wounds. 11/14/2019 on evaluation today patient is actually seeming to make good improvement in regard to his bilateral plantar foot wounds. We have been using a cast on the left side and on the right side he has been using dressings he is changing up his own accord. With that being said he tells me that he is also not walking as much just due to how unsteady he feels. He takes it easy when he does have to walk and when he does not have to walk he is resting. This is probably help in his right foot as well has the left foot which is  actually measuring better. In fact both are measuring better. Overall I am very pleased with how things seem to be progressing. The patient does have some odor on the left foot this does have me concerned about the possibility of infection, and actually probably go ahead and put him on antibiotics today as well as utilizing a continuation of the cast on the left foot I think that will be fine we probably just need to bring him in sooner to change this not last a whole week. 1/29; we brought the patient back today for a total contact cast change on the left out of concern for excessive drainage. We are using drawtex over the wound as the primary dressing 11/21/2019 on evaluation today patient appears to be doing well with regard to his left plantar foot. In fact both foot ulcers actually seem to be doing pretty well. Nonetheless he is having a lot of drainage on the left at this time and again we did obtain a wound culture did show positive for Staph aureus that was reviewed by myself today as well. Nonetheless he is on Bactrim which was shown to be sensitive that should be helping in this regard. Fortunately there is no signs of infection systemically at this point. 2/5; back in clinic today for a total contact cast change apparently secondary to very significant drainage. Still using drawtex 11/28/2019 upon evaluation today patient appears to be doing well with regard to his wounds. The right foot is doing okay as measured about the same in my opinion. The left foot is actually showing signs of significant improvement is measuring smaller there is a lot of hyper granulation likely due to the continued drainage at this point. We did obtain approval for a snap VAC I think that is good to  be appropriate for him and will likely help this tremendously underneath the cast. He is definitely in agreement with proceeding with such. 2/12; patient came in today after his snap VAC lost suction. Brought in to see  one of our nurses. The dressing was replaced and then we put the cast back on and rehooked up the snap VAC. Apparently his wound looked very good per our intake nurse. 2/15; again we replaced the cast on Friday. By Saturday the snap VAC and light suction. He called this morning he comes in acutely. The wounds look fine however the VAC is not functioning. We replaced the cast using silver alginate as the primary dressing backed with Kerramax. The snap VAC was not replaced 12/05/2019 upon evaluation today patient appears to be doing better in regard to his left plantar foot ulcer. Fortunately there is no signs of active infection at this time. Unfortunately he is continuing to have issues with the right foot he is really not making any progress here things seem to be somewhat stagnant to be honest. The depth has increased but that is due to me having debrided the wound in the past based on what I am seeing. 12/12/2019 on evaluation today patient appears to be doing more poorly in regard to the left lower extremity. He has some erythema spreading up the side of his foot I am concerned about infection again at this point. Unfortunately he has been seeing improvement with a total contact cast but I do not think we should put that on today. On his right plantar foot he continues to have significant drainage this is actually measuring deeper I really do not feel like you are making any progress whatsoever. I have prescribed Granix for him unfortunately his insurance apparently was going to cost him a $500 co-pay. 12/19/2019 upon evaluation today patient actually appears to be doing better in regard to both wounds. With that being said he actually did get the reGranix which he had to pay $500 for. With that being said it does look like that he is actually made some improvement based on what I am seeing at this point with the reGranix. Obviously if he is going to continue this we are going to do something about  trying to get him some help in covering the cost. [employer], [name] ([id number]) [id number] Page 3 of 14 12/26/2019 on evaluation today patient appears to be doing really much better even compared to last week. Overall the wound seems to be much better even compared to last week and last week was better than the week before. Since has been using the reGranix his symptoms have improved significantly. With that being said the issue right now is simply that this is a very expensive medication for him the first dose cost him $500. Upon inspection patient's wound bed actually is however dramatically improved compared to before he started this 2 weeks ago. 01/02/2020 upon evaluation today patient appears to actually be doing well. He still had a little bit of reGranix left that has been using in small amounts he just been applying it every other day instead of every day in order to make it go longer. Overall we are still seeing excellent improvement he is measuring smaller looking better healthier tissue and everything seems to be pointing to this headed in the right direction. Fortunately there is no evidence of infection either which is also excellent news. He does have his MRI coming up within the next week. 01/09/2020 upon evaluation today  patient appears to be doing a little worse today compared to previous week's evaluation. He is actually been out of the reGranix at this point. He has been trying to make the stretch out so he has been changing the dressings on a regular set schedule like he was previous. I think this has made a difference. Fortunately there is no signs of active infection at this time. No fevers, chills, nausea, vomiting, or diarrhea. 01/16/2020 upon evaluation today patient appears to be doing well with regard to his left plantar foot ulcer. The right plantar foot still shows some significant depth at this point. Fortunately there is no signs of active infection  at this time. 01/30/2020 upon evaluation today patient appears to be doing about the same in regard to his right plantar foot ulcer there is still some depth here and we had to wait till he actually switched over to his new insurance to get approval for the MRI under his new insurance plan. With that being said he now has switched as of April 1. Fortunately there is no signs of active infection at this time. Overall in regard to his left foot ulcer this seems to be doing much better and I am actually very pleased with how things are going. With that being said it is not quite as much progress as we were seeing with the reGranix but at the same time he has had trouble getting this apparently there is been some hindrance here. I Ernie Hew try to actually send this to [facility] that was recommended by the drug rep to me. 02/13/20 upon evaluation today patient appears to be doing better in regard to his left foot ulcer this is great news. Unfortunately the right foot ulcer is not really significantly better at this time. There is no signs of active infection systemically though he did have his MRI which showed unfortunately he does have infection noted including an abscess in the foot. There is also marrow changes noted which are consistent with osteomyelitis based on the radiology review and interpretation. Unfortunately considering that the wound is not really making the progress that we will he would like to be seen I think that this is an indication that he may need some further referral both infectious disease as well as potentially to podiatrist to see if there is anything that can be done to help with the situation that were dealing with here. The left foot again is doing great. READMISSION 06/04/2021 This is a 61 year old man who was in the clinic in 2021 followed by Allen Derry for areas on the right and left foot. He developed a left foot infection and was referred to ID. He left the clinic in a  nonhealed state and was followed for a period of [facility] Marylene Land. Apparently things really deteriorated in early July when he was admitted to hospital from 04/27/2021 through 05/06/2021 with sepsis secondary to a left foot infection. His blood cultures were negative. An MRI suggested fifth metatarsal osteomyelitis a left ankle septic joint. He was treated with vancomycin and ertapenem which he is still taking and may just about be finishing. He was seen by orthopedics and the patient adamantly refused to BKA. As far as I can tell he did not have the ankle aspirated I am not exactly sure what the issue was here. Since he has been discharged she is at [facility] health care for rehabilitation. He was last seen by Dr. Algis Liming on 05/20/2021 he noted  osteo of the tibial talar bone cuboid and fifth metatarsal which is even more extensive than what was suggested by the MRI. He is apparently going for a consultation with orthopedic surgery in [place] sometime next week. I received a call about this man 2 weeks ago from Dr. Allyson Sabal who follows him for the possibility of PAD. ABIs I think done in the office showed a ABI on the right of 1.05 at the PTA and 0.99 at the PTA on the left. He had a DVT rule out in the left leg that was negative for the DVT. Past medical history is extensive and includes diastolic heart failure, right first metatarsal head ulcer in 2021, excision of the right second ray by Dr. Marylene Land on 03/13/2020, hypertension, hypothyroidism. Left total hip replacement, right total knee replacement, carpal tunnel syndrome, obstructive sleep apnea alcohol abuse with cirrhosis although the patient denies current alcohol intake. The patient does not think he is a diabetic however looking through [place] link I see 2 HgbAic's of this year that were greater than equal to 6.5 which by definition makes him diabetic. Nevertheless he is not on any treatment and does not check his blood  sugars. The patient is now back home out of the nursing home. Saw Dr. Algis Liming last week he was taken off vancomycin and ertapenem on August 22 and now is on doxycycline on Augmentin. He also saw Dr. Weston Anna who is his orthopedic surgeon in [place] he recommended a [employer]	[place]. He has been using Medihoney. 9/6I have been having trouble getting hyperbarics approved through our prior authorization process. Even though he had a limb threatening infection in the left foot and probably the left ankle there glitches in how some of the reports are worded also some of the consultants. In any case I am going to repeat his sedimentation rate and C-reactive protein. I am generally not in favor of doing things like this as they really do not alter the plan of care from my point of view however I am going to need to demonstrate that these remain high in order to get this through forhyperbaric treatment for chronic refractory osteomyelitis 9/13; following this man for a wound on his left plantar foot in the setting of type 2 diabetes and Charcot deformity. He has underlying chronic refractory osteomyelitis. Follow-up sedimentation rate and C-reactive protein were both elevated but the C-reactive protein was down to 1.4, sedimentation rate at 70. Sedimentation rate was only slightly down from previous at 85. His wound is measuring slightly smaller. 9/20; patient started hyperbaric oxygen therapy today and tolerated treatment well. This is for the underlying osteomyelitis. He remains on antibiotics but thinks he is getting close to finishing. The wound on the plantar aspect of his foot is the other issue we are following here. He is using Medihoney The patient has a Charcot foot in the setting of type 2 diabetes. He is going to need a total contact cast although his partner was away this week and we elected to delay this till next week 9/27; patient still tolerating hyperbaric oxygen well. Wound looked generally  healthy not much depth under illumination still 100% covered in fibrinous debris. Raised callused edges around the wound he was prepared for a total contact cast. We have been using Medihoney 9/30; patient is back for his first obligatory total contact cast change. We are using Hydrofera Blue. Noted by our intake nurse to have a lot of drainage or at least a moderate amount of drainage.  I am not sure I was previously aware of this 10/4; patient arrives today with a lot of drainage under the cast. When he had it changed last Friday there was also a similar amount of drainage. Her intake nurse says that they tried a wound VAC on him perhaps while I was on vacation in August under the cast but that did not work. In my experience that has not been unusual. We have been using Hydrofera Blue with all the secondary absorbers. The drainage today went right through all of our dressings. The patient is concerned about his foot being in a cast without much drainage. He is tolerating HBO well. There has been improvements in the wound in the mid part of his foot in the setting of a Charcot deformity 10/7; patient presents for cast change. He has no issues or complaints today. He denies signs of infection. 10/11; patient presents for cast change. At this time he would like to take a break from the cast. He would like to do daily dressing changes with Hydrofera Blue. He currently denies signs of infection. 10/14; his cast was taken off last week at his request. He arrives in the clinic with [facility]. He has been changing the dressing himself. He has way too much edema in the left foot and leg to consider a total contact cast. I do not really know the issue here. He does have chronic venous insufficiency His wife stopped me in the clinic earlier in the week to report he is drinking again and she is concerned. I am uncertain whether there are other issues 10/18; he arrived in clinic last week having bilateral  lower extremity edema likely secondary to chronic venous insufficiency there was too much edema in the left leg to apply a total contact cast. I put him in compression on the left leg to control the swelling. This week he cut the wrap on the foot for reasons that are unclear however today he arrives in clinic with a smaller left leg but massive edema in the left foot. He is supposed to be wearing a juxta lite stocking on the right leg but he is not wearing the contact layer. His attendance at hyperbaric oxygen has been dwindling, he did not dive yesterday and he did not dive today SWAMY, Issam ([id number]) [id number] Page 4 of 14 concerned about hyperbarics causing swelling. I looked back in his record his last echo was in 2020 essentially normal left and right ventricular function. Last BUN and creatinine were done in July this was normal. He is having a lot of drainage in the left plantar foot wound 10/25; again he comes in today having missed HBO yesterday. Macerated skin around the wound which really does not look very good at all. A lot of edema in the left foot but an improvement in edema on the left leg. We wrapped him last week because of the amount of swelling in the left leg. We could not apply a total contact cast. The patient states that he wants to be able to change his dressing himself. I might consider this if he had stockings to control the swelling. He said he be here for HBO tomorrow. I will check the degree of erythema in his forefoot which I have marked. He is on doxycycline and Augmentin which was renewed by Dr. Algis Liming but I cannot see a follow-up note, follow-up inflammatory markers etc. I 1 point he said he was going to see his orthopedic surgeon in  [place] I am not sure if he ever did this. I do not know why he has not followed up with infectious disease, he says he was not given an appointment but he is still on Augmentin doxycycline 11/1; he did  not do the lab work I ordered last week. Still on Augmentin and doxycycline. Silver alginate and he is changing this daily using his own juxta lite stocking. The surface of the wound does not look too bad. No real epithelialization however. The patient was seen today along with HBO 08/26/2021 upon evaluation today patient is being seen at his request by myself he wanted to transfer care to see me. With that being said he has been seeing Dr. Leanord Hawking since he came back in August of this year. Unfortunately he tells me he still having quite a bit of drainage. He is also significant erythema and warmth noted of the foot as well. He has been hit or miss with regard to hyperbaric oxygen therapy tells me that at this point he took this week off because he was extremely claustrophobic and having a lot of issues he plans to start back next week. Nonetheless he has been given some medication by Dr. Leanord Hawking as well to help calm things down which again may help him. Hopefully he can get back in hyperbarics as I think this is likely necessary. Nonetheless there does seem to be evidence of cellulitis noted today as well and in general I am a little concerned in that regard. His wound unfortunately is significant on this left foot and is right where he takes the brunt of the force secondary to the Charcot arthropathy. I do not think a total contact cast is ideal due to the fact that he unfortunately is draining much too significantly he is also not happy with the idea of using a cast therefore he states he would not want to do that anyway. 09/16/2021 upon evaluation today patient's plantar foot ulcer unfortunately continues to show signs of issues here. He has been in the hospital due to trying to detox himself from alcohol he had withdrawal symptoms and subsequently was hospitalized. During that time it was recommended by both orthopedics as well as infectious disease apparently that he proceed with amputation. With that  being said they discussed with him the risk of not doing so. This is well- documented in the encounter. With that being said the patient does not want to proceed down that road and tells me that he declined their advice in that regard. Subsequently our goal then is to try to do what we can to try to get this thing healed and closed. I think this means he is getting need to stay off of it is much as possible he does have a wheelchair at home that he tells me he can use I think that that is going to be something that he does need to do. 09/23/2021 upon evaluation today patient continues to have significant issues here with his foot ulcer which is plantar on the left foot. Subsequently again he does have a history of Charcot foot he also has an issue of having had osteomyelitis as well as an open wound for some time here. Its been recommended multiple times for him to have an amputation although that is not something that he is really interested or wanting to do. 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 Subsequently this means that he also has a gait  abnormality that is causing issues with his walking and causing abnormal pressures in the central part of his foot this is the reason he has a wound. Nonetheless I want to see what I can do about trying to get a boot to help with stabilization and offloading of working to see what we can do in that regard. 09/30/2021 upon evaluation today patient appears to be doing well with regard to his plantar foot ulcer. He still has significant granular hypergranulation but again this is much less than what it was even last time I saw him last week. Fortunately I think we are headed in the right direction. This is definitely something we could consider a skin graft or something along those lines if we can get it flattened out enough and get the drainage from being so significant. I feel like we are making some headway  here. 10/07/2021 upon evaluation today patient appears to be doing decently well in regard to his wound. Fortunately there does not appear to be any signs of infection overall I feel like it is actually showing some signs of improvement. He still has some hypergranulation but this is much less than its been. 10/21/2021 upon evaluation today patient appears to be doing well with regard to his wound is actually showing some signs of improvement which is great. We have been doing the chemical cauterization with silver nitrate as well as debridement as needed and again has been using silver alginate up to this point. With that being said he does have some Hydrofera Blue at home he wonders if that can be beneficial at this point. 11/25/2021 upon evaluation today patient appears to be doing somewhat poorly in regard to his plantar foot ulcer. He is also been having some issues with his congestive heart failure. He has recently been in the hospital. I did review those notes as well and apparently his heart failure has been somewhat out of control. He is actually being managed as an outpatient in this regard but nonetheless this is something that can still be kept a close eye on according to the notes and what I see. Fortunately I do not see any signs of active infection at this time which is great news. Nonetheless I am concerned about the boggy central portion of the wound which I think is going to likely open up in the near future. Readmission: 01-27-2022 patient presents for follow-up here in the clinic today. His last been November 25, 2021 since have seen him. At that time we just ordered an MRI fortunately the MRI did not show any obvious signs of osteomyelitis and there were some reactive changes which were questionable and could not be excluded. With that being said in the interim since have seen him back on February 8 things have gotten worse from the standpoint of how the wound appears today. I do think  that he is going to require potentially more debridement to clean this wound out than what I can even offer here in the clinic there is a tremendous amount of hypergranulation tissue which is not sufficient to grow new tissue over and honestly I think this is going to lead to a delay in healing if its not taking care of. I need to see if I can get him into see a surgeon to get an opinion on whether or not they could take him to the OR for an aggressive surgical debridement to clear out this hypergranulation tissue and achieve hemostasis which is can be  the biggest issue with me here in the clinic as he does tend to bleed even with very light superficial debridements. The patient's not opposed to this and he states that if I have someone that like for him to see he would be happy to go. Otherwise his medical history really has not changed. He does tell me he is getting ready to go for an evaluation for CPAP machine. Readmission 05/04/2022 Patient was last seen 3 months ago. He states he went to rehab for alcohol abuse. He continues to have a wound on the plantar aspect of the left foot. He states this has never completely healed over the past several years. He is following with podiatry for this issue currently and they are recommending blast X with collagen and a cam boot. He has not started this yet. He has home health. Currently he is using silver alginate. He also follows with Dr. Algis Liming infectious disease who ordered an MRI of the foot as he is concerned for osteomyelitis. CRP and sed rate are elevated. He is currently on Augmentin for prophylaxis as he has a history of osteomyelitis to this foot. Infectious disease has recommended amputation. Currently patient denies systemic signs of infection. 8/7; patient is following up with podiatry on 8/11. He wanted to come in to be debrided. He has no issues or complaints today. He has been using silver alginate to the wound bed. He has home health that  helps change the dressings. He had an MRI completed on 7/18 that did not show evidence of osteomyelitis. He is scheduled to see infectious disease on 8/16. 8/25; Patient is following with podiatry for his wound care and he is currently using silver alginate and a defender boot for his Left foot wound. He would like to have debridement to the wound but is unable to see podiatry until the end of the month. He has asked if we could debride him. 9/21; patient follows with podiatry for his foot wound but [name] to come into our clinic for debridements. He was last seen on 06/24/2022 by Dr. Allena Katz, podiatry. He is currently using blast X to the wound bed. He has been using a Psychologist, forensic. He is not scheduled to see podiatry until 10/11 and comes in for debridement today. 10/9; patient presents for follow-up. He states he is no longer following with podiatry. He has been doing silver alginate to the wound bed. He states he would like to attempt a total contact cast but starting next week. He currently denies systemic signs of infection. 10/16; patient presents for follow-up. He has been using silver alginate to the wound bed. We discussed having the cast placed today and patient would like to proceed with this. He has follow-up in 2 days for his cast exchange. He currently denies signs of infection. IRAM, LUNDBERG ([id number]) [id number] Page 5 of 14 Electronic Signature(s) Signed: 08/02/2022 3:56:07 PM By: [name] DO Entered By: [name] on 08/02/2022 13:15:46 -------------------------------------------------------------------------------- Physical Exam Details Patient Name: Date of Service: JUJUAN, DUGO 08/02/2022 12:30 PM Medical Record Number: [id number] Patient Account Number: [id number] Date of Birth/Sex: Treating RN: 03-Feb-1961 (61 y.o. [name] Primary Care Provider: [name] Other Clinician: Referring Provider: Treating  Provider/Extender: Morley Kos Weeks in Treatment: 12 Constitutional respirations regular, non-labored and within target range for patient.. Cardiovascular 2+ dorsalis pedis/posterior tibialis pulses. Psychiatric pleasant and cooperative. Notes Left foot: T the plantar aspect there is an open wound with pale granulation and film and Hyper  granulated areas. No surrounding signs of soft tissue infection. o Electronic Signature(s) Signed: 08/02/2022 3:56:07 PM By: [name] DO Entered By: [name] on 08/02/2022 13:17:34 -------------------------------------------------------------------------------- Physician Orders Details Patient Name: Date of Service: HANFORD, LUST [name] 08/02/2022 12:30 PM Medical Record Number: [id number] Patient Account Number: [id number] Date of Birth/Sex: Treating RN: [birth year] (61 y.o. [name] Primary Care Provider: [name] Other Clinician: Referring Provider: Treating Provider/Extender: [name] in Treatment: 12 Verbal / Phone Orders: No Diagnosis Coding Follow-up Appointments ppointment in 1 week. - with Dr. Leanord Hawking Wednesday for cast change @ 2pm Return A Next week with Dr. Mikey Bussing Anesthetic (In clinic) Topical Lidocaine 5% applied to wound bed (In clinic) Topical Lidocaine 4% applied to wound bed Cellular or Tissue Based Products Cellular or Tissue Based Product Type: - Run IVR for Grafix=20% Copay Bathing/ Shower/ Hygiene May shower with protection but do not get wound dressing(s) wet. - May use cast protector bag from [facility], [employer] or [facility] Edema Control - Lymphedema / SCD / Other Elevate legs to the level of the heart or above for 30 minutes daily and/or when sitting, a frequency of: - throughout the day DEZ, STAUFFER ([id number]) [id number] Page 6 of 14 A void standing for long periods of time. Exercise regularly Moisturize legs  daily. Compression stocking or Garment 30-40 mm/Hg pressure to: - wear the Juxtalite HD to right and left leg. apply in the morning and remove at night. Off-Loading Total Contact Cast to Left Lower Extremity Other: - Defender Boot: minimal weight bearing left foot use wheelchair for mobility. Additional Orders / Instructions Follow Nutritious Diet Wound Treatment Wound #5 - Foot Wound Laterality: Plantar, Left Cleanser: Wound Cleanser 1 x Per Week/30 Days Discharge Instructions: Cleanse the wound with wound cleanser prior to applying a clean dressing using gauze sponges, not tissue or cotton balls. Prim Dressing: KerraCel Ag Gelling Fiber Dressing, 2x2 in (silver alginate) 1 x Per Week/30 Days ary Discharge Instructions: Apply silver alginate to wound bed as instructed Secondary Dressing: ABD Pad, 5x9 (Home Health) 1 x Per Week/30 Days Discharge Instructions: Apply over primary dressing as directed. Secondary Dressing: Drawtex 4x4 in 1 x Per Week/30 Days Discharge Instructions: cut to into wound bed over the alginate. Secured With: [employer], 4.5x3.1 (in/yd) 1 x Per Week/30 Days Discharge Instructions: Secure with Kerlix as directed. Secured With: 17M Medipore H Soft Cloth Surgical T ape, 4 x 10 (in/yd) 1 x Per Week/30 Days Discharge Instructions: Secure with tape as directed. Electronic Signature(s) Signed: 08/02/2022 3:56:07 PM By: [name] DO Entered By: [name] on 08/02/2022 13:17:43 -------------------------------------------------------------------------------- Problem List Details Patient Name: Date of Service: [name], [name] [name] 08/02/2022 12:30 PM Medical Record Number: [id number] Patient Account Number: [id number] Date of Birth/Sex: Treating RN: October 08, 1961 (61 y.o. [name] Primary Care Provider: [name] Other Clinician: Referring Provider: Treating Provider/Extender: [name] in Treatment: 12 Active  Problems ICD-10 Encounter Code Description Active Date MDM Diagnosis E11.621 Type 2 diabetes mellitus with foot ulcer 05/04/2022 No Yes L97.522 Non-pressure chronic ulcer of other part of left foot with fat layer exposed 05/04/2022 No Yes M86.372 Chronic multifocal osteomyelitis, left ankle and foot 05/04/2022 No Yes M14.672 Charcot's joint, left ankle and foot 05/04/2022 No Yes MATHEW, Tonnie ([id number]) [id number] Page 7 of 14 Inactive Problems Resolved Problems Electronic Signature(s) Signed: 08/02/2022 3:56:07 PM By: [name] DO Entered By: [name] on 08/02/2022 13:14:40 -------------------------------------------------------------------------------- Progress Note Details Patient Name: Date of Service: [name],  [name] [name] 08/02/2022 12:30 PM Medical Record Number: [id number] Patient Account Number: [id number] Date of Birth/Sex: Treating RN: 1961/01/13 (61 y.o. Marcheta Grammes Primary Care Provider: Billey Gosling Other Clinician: Referring Provider: Treating Provider/Extender: Mickle Asper in Treatment: 12 Subjective Chief Complaint Information obtained from Patient Left foot ulcer History of Present Illness (HPI) 10/31/2019 upon evaluation today patient appears to be doing somewhat poorly in regard to his bilateral plantar feet. He has wounds that he tells me have been present since 2012 intermittently off and on. Most recently this has been open for at least the past 6 months to a year. He has been trying to treat this in different ways using Santyl along with various other dressings including Medihoney and even at one point Xeroform. Nothing really has seem to get this completely closed. He was recently in the hospital for cellulitis of his leg subsequently he did have x-rays as well as MRIs that showed negative for any signs of osteomyelitis in regard to the wounds on his feet. Fortunately there is no signs of  systemic infection at this time. No fevers, chills, nausea, vomiting, or diarrhea. Patient has previously used Darco offloading shoes as far as frontal floaters as well as postop surgical shoes. He has never been in a total contact cast that may be something we need to strongly consider here. Patient's most recent hemoglobin A1c 1 month ago was 5.3 seems to be very well controlled which is great. Subsequently he has seen vascular as well as podiatry. His ABIs are 1.07 on the left and 1.14 on the right he seems to be doing well he does have chronic venous stasis. 11/07/2019 upon evaluation today patient appears to be doing well with regard to his wounds all things considered. I do not see any severe worsening he still has some callus buildup on the right more than the left he notes that he has been probably more active than he should as far as walking is concerned is just very hard to not be active. He knows he needs to be more careful in this regard however. He is willing to give the cast a try at this point although he notes that he is a little nervous about this just with regard to balance although he will be very careful and obviously if he has any trouble he knows to contact the office and let me know. 1/22; patient is in for his obligatory first total contact cast change. Our intake nurse reported a very large amount of drainage which is spelled out over to the surrounding skin. Has bilateral diabetic foot wounds. He has Charcot feet. We have been using silver alginate on his wounds. 11/14/2019 on evaluation today patient is actually seeming to make good improvement in regard to his bilateral plantar foot wounds. We have been using a cast on the left side and on the right side he has been using dressings he is changing up his own accord. With that being said he tells me that he is also not walking as much just due to how unsteady he feels. He takes it easy when he does have to walk and when he does  not have to walk he is resting. This is probably help in his right foot as well has the left foot which is actually measuring better. In fact both are measuring better. Overall I am very pleased with how things seem to be progressing. The patient does have some odor on the left foot  this does have me concerned about the possibility of infection, and actually probably go ahead and put him on antibiotics today as well as utilizing a continuation of the cast on the left foot I think that will be fine we probably just need to bring him in sooner to change this not last a whole week. 1/29; we brought the patient back today for a total contact cast change on the left out of concern for excessive drainage. We are using drawtex over the wound as the primary dressing 11/21/2019 on evaluation today patient appears to be doing well with regard to his left plantar foot. In fact both foot ulcers actually seem to be doing pretty well. Nonetheless he is having a lot of drainage on the left at this time and again we did obtain a wound culture did show positive for Staph aureus that was reviewed by myself today as well. Nonetheless he is on Bactrim which was shown to be sensitive that should be helping in this regard. Fortunately there is no signs of infection systemically at this point. 2/5; back in clinic today for a total contact cast change apparently secondary to very significant drainage. Still using drawtex 11/28/2019 upon evaluation today patient appears to be doing well with regard to his wounds. The right foot is doing okay as measured about the same in my opinion. The left foot is actually showing signs of significant improvement is measuring smaller there is a lot of hyper granulation likely due to the continued drainage at this point. We did obtain approval for a snap VAC I think that is good to be appropriate for him and will likely help this tremendously underneath the cast. He is definitely in agreement  with proceeding with such. 2/12; patient came in today after his snap VAC lost suction. Brought in to see one of our nurses. The dressing was replaced and then we put the cast back on and rehooked up the snap VAC. Apparently his wound looked very good per our intake nurse. 2/15; again we replaced the cast on Friday. By Saturday the snap VAC and light suction. He called this morning he comes in acutely. The wounds look fine however the VAC is not functioning. We replaced the cast using silver alginate as the primary dressing backed with Kerramax. The snap VAC was not replaced 12/05/2019 upon evaluation today patient appears to be doing better in regard to his left plantar foot ulcer. Fortunately there is no signs of active infection at this time. Unfortunately he is continuing to have issues with the right foot he is really not making any progress here things seem to be somewhat stagnant to be honest. The depth has increased but that is due to me having debrided the wound in the past based on what I am seeing. 12/12/2019 on evaluation today patient appears to be doing more poorly in regard to the left lower extremity. He has some erythema spreading up the side of his foot I am concerned about infection again at this point. Unfortunately he has been seeing improvement with a total contact cast but I do not think we STACI, CARVER ([id number]) [id number] Page 8 of 14 should put that on today. On his right plantar foot he continues to have significant drainage this is actually measuring deeper I really do not feel like you are making any progress whatsoever. I have prescribed Granix for him unfortunately his insurance apparently was going to cost him a $500 co-pay. 12/19/2019 upon evaluation today patient actually  appears to be doing better in regard to both wounds. With that being said he actually did get the reGranix which he had to pay $500 for. With that being said it does look  like that he is actually made some improvement based on what I am seeing at this point with the reGranix. Obviously if he is going to continue this we are going to do something about trying to get him some help in covering the cost. 12/26/2019 on evaluation today patient appears to be doing really much better even compared to last week. Overall the wound seems to be much better even compared to last week and last week was better than the week before. Since has been using the reGranix his symptoms have improved significantly. With that being said the issue right now is simply that this is a very expensive medication for him the first dose cost him $500. Upon inspection patient's wound bed actually is however dramatically improved compared to before he started this 2 weeks ago. 01/02/2020 upon evaluation today patient appears to actually be doing well. He still had a little bit of reGranix left that has been using in small amounts he just been applying it every other day instead of every day in order to make it go longer. Overall we are still seeing excellent improvement he is measuring smaller looking better healthier tissue and everything seems to be pointing to this headed in the right direction. Fortunately there is no evidence of infection either which is also excellent news. He does have his MRI coming up within the next week. 01/09/2020 upon evaluation today patient appears to be doing a little worse today compared to previous week's evaluation. He is actually been out of the reGranix at this point. He has been trying to make the stretch out so he has been changing the dressings on a regular set schedule like he was previous. I think this has made a difference. Fortunately there is no signs of active infection at this time. No fevers, chills, nausea, vomiting, or diarrhea. 01/16/2020 upon evaluation today patient appears to be doing well with regard to his left plantar foot ulcer. The right plantar  foot still shows some significant depth at this point. Fortunately there is no signs of active infection at this time. 01/30/2020 upon evaluation today patient appears to be doing about the same in regard to his right plantar foot ulcer there is still some depth here and we had to wait till he actually switched over to his new insurance to get approval for the MRI under his new insurance plan. With that being said he now has switched as of April 1. Fortunately there is no signs of active infection at this time. Overall in regard to his left foot ulcer this seems to be doing much better and I am actually very pleased with how things are going. With that being said it is not quite as much progress as we were seeing with the reGranix but at the same time he has had trouble getting this apparently there is been some hindrance here. I Ernie HewMinna try to actually send this to [facility] that was recommended by the drug rep to me. 02/13/20 upon evaluation today patient appears to be doing better in regard to his left foot ulcer this is great news. Unfortunately the right foot ulcer is not really significantly better at this time. There is no signs of active infection systemically though he did have his MRI which showed unfortunately he  does have infection noted including an abscess in the foot. There is also marrow changes noted which are consistent with osteomyelitis based on the radiology review and interpretation. Unfortunately considering that the wound is not really making the progress that we will he would like to be seen I think that this is an indication that he may need some further referral both infectious disease as well as potentially to podiatrist to see if there is anything that can be done to help with the situation that were dealing with here. The left foot again is doing great. READMISSION 06/04/2021 This is a 61 year old man who was in the clinic in 2021 followed by Allen Derry for areas on the  right and left foot. He developed a left foot infection and was referred to ID. He left the clinic in a nonhealed state and was followed for a period of time [facility]. Marylene Land. Apparently things really deteriorated in early July when he was admitted to hospital from 04/27/2021 through 05/06/2021 with sepsis secondary to a left foot infection. His blood cultures were negative. An MRI suggested fifth metatarsal osteomyelitis a left ankle septic joint. He was treated with vancomycin and ertapenem which he is still taking and may just about be finishing. He was seen by orthopedics and the patient adamantly refused to BKA. As far as I can tell he did not have the ankle aspirated I am not exactly sure what the issue was here. Since he has been discharged she is at [facility] health care for rehabilitation. He was last seen by Dr. Algis Liming on 05/20/2021 he noted osteo of the tibial talar bone cuboid and fifth metatarsal which is even more extensive than what was suggested by the MRI. He is apparently going for a consultation with orthopedic surgery in [place] sometime next week. I received a call about this man 2 weeks ago from Dr. Allyson Sabal who follows him for the possibility of PAD. ABIs I think done in the office showed a ABI on the right of 1.05 at the PTA and 0.99 at the PTA on the left. He had a DVT rule out in the left leg that was negative for the DVT. Past medical history is extensive and includes diastolic heart failure, right first metatarsal head ulcer in 2021, excision of the right second ray by Dr. Marylene Land on 03/13/2020, hypertension, hypothyroidism. Left total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 Immunizations Pneumococcal Vaccine: Received Pneumococcal Vaccination: No Implantable Devices [place], [name] ([id number]) [id number] Page 13 of 14 None Hospitalization / Surgery History Type of Hospitalization/Surgery Detox/ fall 09/01/2021-09/05/2021 detox/ SNF [facility] x2 months 04/26/2022 Family and Social History Unknown History: Yes; Former smoker - quit 2016; Marital Status - Divorced; Alcohol Use: Rarely - history of alcoholism-sober x2 months; Drug Use: Current History - Marijuana; Caffeine Use: Moderate; Financial Concerns: No; Food, Clothing or Shelter Needs: No; Support System Lacking: No; Transportation Concerns: No Electronic Signature(s) Signed: 08/02/2022 3:56:07 PM By: [name] DO Signed: 08/02/2022 1:50:52 PM By: [name] Entered By: [name] on 08/02/2022 13:15:52 -------------------------------------------------------------------------------- Total Contact Cast Details Patient Name: Date of Service: RODGERS, [name] 08/02/2022 12:30 PM Medical Record Number: [id number] Patient Account Number: [id number] Date of Birth/Sex: Treating RN: [birth year] (61 y.o. [name] Primary Care Provider: [name] Other Clinician: Referring Provider: Treating Provider/Extender: [name] in Treatment: 12 T Contact Cast Applied for Wound Assessment: otal Wound #5 Left,Plantar Foot Performed By: Physician [name], DO Post Procedure  Diagnosis Same as Pre-procedure Electronic Signature(s) Signed: 08/02/2022 3:56:07 PM By: [name] DO Signed: 08/02/2022 1:50:52 PM By: [name] Entered By: [name] on 08/02/2022 13:04:39 -------------------------------------------------------------------------------- SuperBill Details Patient Name: Date of Service: [name], [name] 08/02/2022 Medical Record Number: [id number] Patient Account Number: [id number] Date of Birth/Sex: Treating RN: [birth year] (61 y.o. [name] Primary Care Provider: [name] Other Clinician: Referring Provider: Treating Provider/Extender: [name] in Treatment: 12 Diagnosis Coding ICD-10 Codes Code Description E11.621 Type 2 diabetes mellitus with foot ulcer L97.522 Non-pressure chronic ulcer of other part of left foot with fat layer exposed M86.372 Chronic multifocal osteomyelitis, left ankle and foot M14.672 Charcot's joint, left ankle and foot Facility Procedures CORTNEY, MCKINNEY ([id number]): CPT4 Code Description 53664403 11042 - DEB SUBQ TISSUE 20 SQ CM/< ICD-10 Diagnosis Description L97.522 Non-pressure chronic ulcer of other part of left foot with fat E11.621 Type 2 diabetes mellitus with foot ulcer [id number] Page 14 of 14: Modifier Quantity 1 layer exposed Physician Procedures : CPT4 Code Description Modifier [id number] 11042 - WC PHYS SUBQ TISS 20 SQ CM ICD-10 Diagnosis Description L97.522 Non-pressure chronic ulcer of other part of left foot with fat layer exposed E11.621 Type 2 diabetes mellitus with foot ulcer Quantity: 1  Electronic Signature(s) Signed: 08/02/2022 3:56:07 PM By: [name] DO Entered By: [name] on 08/02/2022 13:20:00

## 2022-08-04 ENCOUNTER — Encounter (HOSPITAL_BASED_OUTPATIENT_CLINIC_OR_DEPARTMENT_OTHER): Payer: Medicare Other | Admitting: Internal Medicine

## 2022-08-04 DIAGNOSIS — E11621 Type 2 diabetes mellitus with foot ulcer: Secondary | ICD-10-CM | POA: Diagnosis not present

## 2022-08-05 NOTE — Progress Notes (Signed)
[name], [name] ([id number]) [id number] Page 1 of 7 Visit Report for 08/02/2022 Arrival Information Details Patient Name: Date of Service: [name], [name] 08/02/2022 12:30 PM Medical Record Number: [id number] Patient Account Number: [id number] Date of Birth/Sex: Treating RN: [birth year] (61 y.o. Marcheta Grammes Primary Care Avin Upperman: Billey Gosling Other Clinician: Referring Bryndle Corredor: Treating Lynsi Dooner/Extender: Mickle Asper in Treatment: 12 Visit Information History Since Last Visit Added or deleted any medications: No Patient Arrived: [name] Any new allergies or adverse reactions: No Arrival Time: 12:40 Had a fall or experienced change in No Transfer Assistance: None activities of daily living that may affect Patient Identification Verified: Yes risk of falls: Secondary Verification Process Completed: Yes Signs or symptoms of abuse/neglect since last No Patient Requires Transmission-Based Precautions: No visito Patient Has Alerts: No Hospitalized since last visit: No Implantable device outside of the clinic No excluding cellular tissue based products placed in the center since last visit: Has Dressing in Place as Prescribed: Yes Has Compression in Place as Prescribed: Yes Has Footwear/Offloading in Place as Yes Prescribed: Left: Removable Cast Walker/Walking Boot Pain Present Now: No Electronic Signature(s) Signed: 08/02/2022 1:50:52 PM By: Lorrin Jackson Entered By: Lorrin Jackson on 08/02/2022 12:41:21 -------------------------------------------------------------------------------- Encounter Discharge Information Details Patient Name: Date of Service: [name] 08/02/2022 12:30 PM Medical Record Number: [id number] Patient Account Number: [id number] Date of Birth/Sex: Treating RN: 1961-01-29 (61 y.o. Marcheta Grammes Primary Care Araya Roel: Billey Gosling Other Clinician: Referring Esai Stecklein: Treating Manasi Dishon/Extender:  Mickle Asper in Treatment: 12 Encounter Discharge Information Items Post Procedure Vitals Discharge Condition: Stable Temperature (F): 98.1 Ambulatory Status: Cane Pulse (bpm): 87 Discharge Destination: Home Respiratory Rate (breaths/min): 18 Transportation: Private Auto Blood Pressure (mmHg): 125/81 Schedule Follow-up Appointment: Yes Clinical Summary of Care: Provided on 08/02/2022 Form Type Recipient Paper Patient Patient Electronic Signature(s) Signed: 08/02/2022 1:42:37 PM By: Horace Porteous, [name] ([id number]) [id number] Page 2 of 7 Entered By: Lorrin Jackson on 08/02/2022 13:42:36 -------------------------------------------------------------------------------- Lower Extremity Assessment Details Patient Name: Date of Service: [name], [name] 08/02/2022 12:30 PM Medical Record Number: [id number] Patient Account Number: [id number] Date of Birth/Sex: Treating RN: April 07, 1961 (61 y.o. [name] Primary Care Donae Kueker: Billey Gosling Other Clinician: Referring Aparna Vanderweele: Treating Ria Redcay/Extender: Lucile Crater Weeks in Treatment: 12 Edema Assessment Assessed: [Left: Yes] [Right: No] Edema: [Left: N] [Right: o] Calf Left: Right: Point of Measurement: 37 cm From Medial Instep 38 cm Ankle Left: Right: Point of Measurement: 11 cm From Medial Instep 25 cm Vascular Assessment Pulses: Dorsalis Pedis Palpable: [Left:Yes] Electronic Signature(s) Signed: 08/05/2022 4:05:37 PM By: Rhae Hammock RN Entered By: Rhae Hammock on 08/02/2022 12:49:39 -------------------------------------------------------------------------------- Multi Wound Chart Details Patient Name: Date of Service: [name] 08/02/2022 12:30 PM Medical Record Number: [id number] Patient Account Number: [id number] Date of Birth/Sex: Treating RN: 18-Dec-1960 (61 y.o. Marcheta Grammes Primary Care Worthy Boschert: Billey Gosling  Other Clinician: Referring Demarlo Riojas: Treating Ceejay Kegley/Extender: Mickle Asper in Treatment: 12 Vital Signs Height(in): 73 Pulse(bpm): 27 Weight(lbs): 237 Blood Pressure(mmHg): 125/81 Body Mass Index(BMI): 31.3 Temperature(F): 98.1 Respiratory Rate(breaths/min): 18 [5:Photos:] [N/A:N/A] Left, Plantar Foot N/A N/A Wound Location: Gradually Appeared N/A N/A Wounding Event: Diabetic Wound/Ulcer of the Lower N/A N/A Primary Etiology: Extremity Anemia, Sleep Apnea, Congestive N/A N/A Comorbid History: Heart Failure, Hypertension, Peripheral Venous Disease, Cirrhosis , Type II Diabetes, Osteoarthritis, Osteomyelitis, Neuropathy 10/19/2019 N/A N/A Date Acquired: 12 N/A N/A Weeks of Treatment: Open N/A N/A Wound Status: No N/A N/A Wound Recurrence: 3.4x3.5x0.4 N/A N/A Measurements L  x W x D (cm) 9.346 N/A N/A A (cm) : rea 3.738 N/A N/A Volume (cm) : -124.90% N/A N/A % Reduction in A rea: -800.70% N/A N/A % Reduction in Volume: Grade 2 N/A N/A Classification: Medium N/A N/A Exudate A mount: Serosanguineous N/A N/A Exudate Type: red, brown N/A N/A Exudate Color: Thickened N/A N/A Wound Margin: Large (67-100%) N/A N/A Granulation A mount: Red, Pink, Hyper-granulation N/A N/A Granulation Quality: Small (1-33%) N/A N/A Necrotic A mount: Fat Layer (Subcutaneous Tissue): Yes N/A N/A Exposed Structures: Fascia: No Tendon: No Muscle: No Joint: No Bone: No None N/A N/A Epithelialization: Debridement - Excisional N/A N/A Debridement: Pre-procedure Verification/Time Out 12:59 N/A N/A Taken: Subcutaneous N/A N/A Tissue Debrided: Skin/Subcutaneous Tissue N/A N/A Level: 11.9 N/A N/A Debridement A (sq cm): rea Curette, Other(Silver Nitrate) N/A N/A Instrument: Minimum N/A N/A Bleeding: Pressure N/A N/A Hemostasis A chieved: Procedure was tolerated well N/A N/A Debridement Treatment Response: 3.4x3.5x0.4 N/A N/A Post  Debridement Measurements L x W x D (cm) 3.738 N/A N/A Post Debridement Volume: (cm) Callus: Yes N/A N/A Periwound Skin Texture: Excoriation: No Induration: No Crepitus: No Rash: No Scarring: No Maceration: No N/A N/A Periwound Skin Moisture: Dry/Scaly: No Atrophie Blanche: No N/A N/A Periwound Skin Color: Cyanosis: No Ecchymosis: No Erythema: No Hemosiderin Staining: No Mottled: No Pallor: No Rubor: No No Abnormality N/A N/A Temperature: Debridement N/A N/A Procedures Performed: T Contact Cast otal Treatment Notes Electronic Signature(s) Signed: 08/02/2022 3:56:07 PM By: [name] DO Signed: 08/02/2022 1:50:52 PM By: [name] Entered By: [name] on 08/02/2022 13:14:46 [name], [name] ([id number]) [id number] Page 4 of 7 -------------------------------------------------------------------------------- Multi-Disciplinary Care Plan Details Patient Name: Date of Service: [name], [name] 08/02/2022 12:30 PM Medical Record Number: [id number] Patient Account Number: [id number] Date of Birth/Sex: Treating RN: 05-06-1961 (61 y.o. [name] Primary Care Aamya Orellana: [name] Other Clinician: Referring Zadok Holaway: Treating Stamatia Masri/Extender: [name] in Treatment: 12 Active Inactive Necrotic Tissue Nursing Diagnoses: Impaired tissue integrity related to necrotic/devitalized tissue Goals: Necrotic/devitalized tissue will be minimized in the wound bed Date Initiated: 06/11/2022 Target Resolution Date: 08/12/2022 Goal Status: Active Patient/caregiver will verbalize understanding of reason and process for debridement of necrotic tissue Date Initiated: 06/11/2022 Target Resolution Date: 08/12/2022 Goal Status: Active Interventions: Assess patient pain level pre-, during and post procedure and prior to discharge Treatment Activities: Apply topical anesthetic as ordered : 06/11/2022 Excisional  debridement : 06/11/2022 Notes: Electronic Signature(s) Signed: 08/02/2022 1:50:52 PM By: [name] Entered By: [name] on 08/02/2022 12:51:12 -------------------------------------------------------------------------------- Pain Assessment Details Patient Name: Date of Service: [name], [name] 08/02/2022 12:30 PM Medical Record Number: [id number] Patient Account Number: [id number] Date of Birth/Sex: Treating RN: Dec 25, 1960 (61 y.o. [name] Primary Care Madelene Kaatz: [name] Other Clinician: Referring Briceyda Abdullah: Treating Anahla Bevis/Extender: Morley Kos Weeks in Treatment: 12 Active Problems Location of Pain Severity and Description of Pain Patient Has Paino No Site Locations [place], [place] ([id number]) [id number] Page 5 of 7 Pain Management and Medication Current Pain Management: Electronic Signature(s) Signed: 08/05/2022 4:05:37 PM By: Fonnie Mu RN Entered By: Fonnie Mu on 08/02/2022 12:49:26 -------------------------------------------------------------------------------- Patient/Caregiver Education Details Patient Name: Date of Service: [name] 10/16/2023andnbsp12:30 PM Medical Record Number: [id number] Patient Account Number: [id number] Date of Birth/Gender: Treating RN: [birth year] (61 y.o. [name] Primary Care Physician: [name] Other Clinician: Referring Physician: Treating Physician/Extender: [name] in Treatment: 12 Education Assessment Education Provided To: Patient Education Topics Provided Offloading: Methods: Explain/Verbal, Printed Responses: State  content correctly Wound/Skin Impairment: Methods: Explain/Verbal, Printed Responses: State content correctly Electronic Signature(s) Signed: 08/02/2022 1:50:52 PM By: [name] Entered By: [name] on 08/02/2022  12:51:31 -------------------------------------------------------------------------------- Wound Assessment Details Patient Name: Date of Service: [name] 08/02/2022 12:30 PM [name] ([id number]) [id number] Page 6 of 7 Medical Record Number: [id number] Patient Account Number: [id number] Date of Birth/Sex: Treating RN: 1961-09-13 (61 y.o. [name] Primary Care Casondra Gasca: [name] Other Clinician: Referring Amry Cathy: Treating Haruna Rohlfs/Extender: Morley Kos Weeks in Treatment: 12 Wound Status Wound Number: 5 Primary Diabetic Wound/Ulcer of the Lower Extremity Etiology: Wound Location: Left, Plantar Foot Wound Open Wounding Event: Gradually Appeared Status: Date Acquired: 10/19/2019 Comorbid Anemia, Sleep Apnea, Congestive Heart Failure, Hypertension, Weeks Of Treatment: 12 History: Peripheral Venous Disease, Cirrhosis , Type II Diabetes, Clustered Wound: No Osteoarthritis, Osteomyelitis, Neuropathy Photos Wound Measurements Length: (cm) 3.4 Width: (cm) 3.5 Depth: (cm) 0.4 Area: (cm) 9.346 Volume: (cm) 3.738 % Reduction in Area: -124.9% % Reduction in Volume: -800.7% Epithelialization: None Tunneling: No Undermining: No Wound Description Classification: Grade 2 Wound Margin: Thickened Exudate Amount: Medium Exudate Type: Serosanguineous Exudate Color: red, brown Foul Odor After Cleansing: No Slough/Fibrino Yes Wound Bed Granulation Amount: Large (67-100%) Exposed Structure Granulation Quality: Red, Pink, Hyper-granulation Fascia Exposed: No Necrotic Amount: Small (1-33%) Fat Layer (Subcutaneous Tissue) Exposed: Yes Necrotic Quality: Adherent Slough Tendon Exposed: No Muscle Exposed: No Joint Exposed: No Bone Exposed: No Periwound Skin Texture Texture Color No Abnormalities Noted: No No Abnormalities Noted: No Callus: Yes Atrophie Blanche: No Crepitus: No Cyanosis: No Excoriation:  No Ecchymosis: No Induration: No Erythema: No Rash: No Hemosiderin Staining: No Scarring: No Mottled: No Pallor: No Moisture Rubor: No No Abnormalities Noted: Yes Temperature / Pain Temperature: No Abnormality Electronic Signature(s) Signed: 08/02/2022 1:50:52 PM By: [name] Signed: 08/05/2022 4:05:37 PM By: Fonnie Mu RN Entered By: Fonnie Mu on 08/02/2022 12:48:56 [name], [name] ([id number]) [id number] Page 7 of 7 -------------------------------------------------------------------------------- Vitals Details Patient Name: Date of Service: [name], [name] 08/02/2022 12:30 PM Medical Record Number: [id number] Patient Account Number: [id number] Date of Birth/Sex: Treating RN: January 14, 1961 (61 y.o. [name] Primary Care Daliah Chaudoin: [name] Other Clinician: Referring Shenetta Schnackenberg: Treating Desmin Daleo/Extender: [name] in Treatment: 12 Vital Signs Time Taken: 12:41 Temperature (F): 98.1 Height (in): 73 Pulse (bpm): 87 Weight (lbs): 237 Respiratory Rate (breaths/min): 18 Body Mass Index (BMI): 31.3 Blood Pressure (mmHg): 125/81 Reference Range: 80 - 120 mg / dl Electronic Signature(s) Signed: 08/02/2022 1:50:52 PM By: [name] Entered By: [name] on 08/02/2022 12:46:05

## 2022-08-09 ENCOUNTER — Encounter (INDEPENDENT_AMBULATORY_CARE_PROVIDER_SITE_OTHER): Payer: Medicare Other | Admitting: Ophthalmology

## 2022-08-09 ENCOUNTER — Encounter (INDEPENDENT_AMBULATORY_CARE_PROVIDER_SITE_OTHER): Payer: Self-pay

## 2022-08-10 ENCOUNTER — Encounter: Payer: Self-pay | Admitting: Internal Medicine

## 2022-08-10 ENCOUNTER — Encounter (HOSPITAL_BASED_OUTPATIENT_CLINIC_OR_DEPARTMENT_OTHER): Payer: Medicare Other | Admitting: Internal Medicine

## 2022-08-10 NOTE — Progress Notes (Unsigned)
Subjective:    Patient ID: [name], male    DOB: [birth year], 61 y.o.   MRN: [id number]     HPI [name] is here for follow up of his chronic medical problems, including   He has seen neurology.  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  He does have joint pain and is concerned about an inflammatory arthritis and would like further evaluation of that.  Neurology did not feel that he had an inflammatory neuropathy.  Inflammatory arthritis work-up  Medications and allergies reviewed with patient and updated if appropriate.  Current Outpatient Medications on File Prior to Visit  Medication Sig Dispense Refill   acamprosate (CAMPRAL) 333 MG tablet Take 333 mg by mouth 2 (two) times daily.     amLODipine (NORVASC) 5 MG tablet Take 5 mg by mouth daily.     atorvastatin (LIPITOR) 80 MG tablet Take 1 tablet (80 mg total) by mouth daily. 90 tablet 1   folic acid (FOLVITE) 1 MG tablet TAKE 1 TABLET BY MOUTH EVERY DAY 90 tablet 0   furosemide (LASIX) 40 MG tablet Take 1 tablet (40 mg total) by mouth daily as needed. 90 tablet 1   gabapentin (NEURONTIN) 300 MG capsule Take 300 mg by mouth 3 (three) times daily.     ibuprofen (ADVIL) 200 MG tablet Take 200 mg by mouth 2 (two) times daily as needed. 2 tabs in am and 2 tabs at hs     losartan (COZAAR) 25 MG tablet TAKE 1 TABLET (25 MG TOTAL) BY MOUTH DAILY. 90 tablet 1   Magnesium 400 MG TABS Take 400 mg by mouth daily. 90 tablet 1   metoprolol succinate (TOPROL-XL) 25 MG 24 hr tablet TAKE 1 TABLET BY MOUTH DAILY 90 tablet 1   nitroGLYCERIN (NITROSTAT) 0.4 MG SL tablet Place 1 tablet (0.4 mg total) under the tongue every 5 (five) minutes x 3 doses as needed for chest pain. 15 tablet 1   potassium chloride SA (KLOR-CON M) 20 MEQ tablet Take 2 tablets (40 mEq total) by mouth daily. Take 2 tablets (40 mEq total) by mouth daily. 30 tablet 2    saccharomyces boulardii (FLORASTOR) 250 MG capsule Take 250 mg by mouth daily.     sertraline (ZOLOFT) 50 MG tablet TAKE 1 TABLET BY MOUTH EVERY DAY 90 tablet 1   thiamine (VITAMIN B1) 100 MG tablet Take 1 tablet (100 mg total) by mouth daily. 90 tablet 1   vitamin B-12 (CYANOCOBALAMIN) 100 MCG tablet TAKE 1 TABLET BY MOUTH DAILY. 90 tablet 1   No current facility-administered medications on file prior to visit.     Review of Systems     Objective:  There were no vitals filed for this visit. BP Readings from Last 3 Encounters:  06/22/22 138/80  06/02/22 137/85  04/15/22 121/83   Wt Readings from Last 3 Encounters:  06/22/22 250 lb (113.4 kg)  06/02/22 246 lb (111.6 kg)  04/29/22 235 lb (106.6 kg)   There is no height or weight on file to calculate BMI.    Physical Exam     Lab Results  Component Value Date   WBC 5.5 06/22/2022   HGB 12.2 (L) 06/22/2022   HCT 36.1 (L) 06/22/2022   PLT 220.0 06/22/2022   GLUCOSE 97 06/22/2022   CHOL 118 06/22/2022   TRIG 51.0 06/22/2022   HDL 35.60 (L) 06/22/2022  LDLCALC 72 06/22/2022   ALT 27 06/22/2022   AST 22 06/22/2022   NA 141 06/22/2022   K 4.2 06/22/2022   CL 107 06/22/2022   CREATININE 0.68 06/22/2022   BUN 17 06/22/2022   CO2 24 06/22/2022   TSH 3.01 06/22/2022   PSA 0.21 02/01/2018   INR 1.2 09/02/2021   HGBA1C 5.8 06/22/2022   MICROALBUR <0.7 06/22/2022     Assessment & Plan:    See Problem List for Assessment and Plan of chronic medical problems.

## 2022-08-10 NOTE — Patient Instructions (Signed)
      Blood work was ordered.   The lab is on the first floor.    Medications changes include :       A referral was ordered for XXX.     Someone will call you to schedule an appointment.    No follow-ups on file.  

## 2022-08-11 ENCOUNTER — Ambulatory Visit (INDEPENDENT_AMBULATORY_CARE_PROVIDER_SITE_OTHER): Payer: Medicare Other

## 2022-08-11 ENCOUNTER — Ambulatory Visit (INDEPENDENT_AMBULATORY_CARE_PROVIDER_SITE_OTHER): Payer: Medicare Other | Admitting: Internal Medicine

## 2022-08-11 VITALS — BP 130/78 | HR 91 | Temp 98.2°F | Ht 73.0 in | Wt 254.0 lb

## 2022-08-11 DIAGNOSIS — L97509 Non-pressure chronic ulcer of other part of unspecified foot with unspecified severity: Secondary | ICD-10-CM

## 2022-08-11 DIAGNOSIS — G8929 Other chronic pain: Secondary | ICD-10-CM | POA: Diagnosis not present

## 2022-08-11 DIAGNOSIS — M25531 Pain in right wrist: Secondary | ICD-10-CM | POA: Diagnosis not present

## 2022-08-11 DIAGNOSIS — F419 Anxiety disorder, unspecified: Secondary | ICD-10-CM

## 2022-08-11 DIAGNOSIS — I10 Essential (primary) hypertension: Secondary | ICD-10-CM

## 2022-08-11 DIAGNOSIS — E039 Hypothyroidism, unspecified: Secondary | ICD-10-CM

## 2022-08-11 DIAGNOSIS — E11621 Type 2 diabetes mellitus with foot ulcer: Secondary | ICD-10-CM | POA: Diagnosis not present

## 2022-08-11 DIAGNOSIS — M2550 Pain in unspecified joint: Secondary | ICD-10-CM

## 2022-08-11 DIAGNOSIS — F3289 Other specified depressive episodes: Secondary | ICD-10-CM

## 2022-08-11 DIAGNOSIS — G621 Alcoholic polyneuropathy: Secondary | ICD-10-CM

## 2022-08-11 LAB — CBC WITH DIFFERENTIAL/PLATELET
Basophils Absolute: 0 10*3/uL (ref 0.0–0.1)
Basophils Relative: 0.5 % (ref 0.0–3.0)
Eosinophils Absolute: 0.1 10*3/uL (ref 0.0–0.7)
Eosinophils Relative: 1.4 % (ref 0.0–5.0)
HCT: 35 % — ABNORMAL LOW (ref 39.0–52.0)
Hemoglobin: 11.7 g/dL — ABNORMAL LOW (ref 13.0–17.0)
Lymphocytes Relative: 23.8 % (ref 12.0–46.0)
Lymphs Abs: 1.4 10*3/uL (ref 0.7–4.0)
MCHC: 33.4 g/dL (ref 30.0–36.0)
MCV: 84.3 fl (ref 78.0–100.0)
Monocytes Absolute: 0.4 10*3/uL (ref 0.1–1.0)
Monocytes Relative: 7 % (ref 3.0–12.0)
Neutro Abs: 3.9 10*3/uL (ref 1.4–7.7)
Neutrophils Relative %: 67.3 % (ref 43.0–77.0)
Platelets: 231 10*3/uL (ref 150.0–400.0)
RBC: 4.15 Mil/uL — ABNORMAL LOW (ref 4.22–5.81)
RDW: 14.7 % (ref 11.5–15.5)
WBC: 5.8 10*3/uL (ref 4.0–10.5)

## 2022-08-11 LAB — C-REACTIVE PROTEIN: CRP: 1.4 mg/dL (ref 0.5–20.0)

## 2022-08-11 LAB — COMPREHENSIVE METABOLIC PANEL
ALT: 21 U/L (ref 0–53)
AST: 22 U/L (ref 0–37)
Albumin: 4 g/dL (ref 3.5–5.2)
Alkaline Phosphatase: 163 U/L — ABNORMAL HIGH (ref 39–117)
BUN: 23 mg/dL (ref 6–23)
CO2: 23 mEq/L (ref 19–32)
Calcium: 9.5 mg/dL (ref 8.4–10.5)
Chloride: 104 mEq/L (ref 96–112)
Creatinine, Ser: 0.67 mg/dL (ref 0.40–1.50)
GFR: 101.09 mL/min (ref >60.00)
Glucose, Bld: 100 mg/dL — ABNORMAL HIGH (ref 70–99)
Potassium: 3.7 mEq/L (ref 3.5–5.1)
Sodium: 136 mEq/L (ref 135–145)
Total Bilirubin: 0.7 mg/dL (ref 0.2–1.2)
Total Protein: 7.9 g/dL (ref 6.0–8.3)

## 2022-08-11 LAB — SEDIMENTATION RATE: Sed Rate: 82 mm/hr — ABNORMAL HIGH (ref 0–20)

## 2022-08-11 LAB — TSH: TSH: 3.05 u[IU]/mL (ref 0.35–5.50)

## 2022-08-11 LAB — URIC ACID: Uric Acid, Serum: 6.4 mg/dL (ref 4.0–7.8)

## 2022-08-11 LAB — HEMOGLOBIN A1C: Hgb A1c MFr Bld: 5.4 % (ref 4.6–6.5)

## 2022-08-11 MED ORDER — SERTRALINE HCL 50 MG PO TABS: 50.0000 mg | ORAL_TABLET | Freq: Every day | ORAL | 1 refills | Status: DC

## 2022-08-11 NOTE — Assessment & Plan Note (Signed)
Chronic X-ray today Check autoimmune blood work above see if there is an autoimmune cause Check uric acid level to rule out gout Depending on results will refer to Ortho versus rheumatology

## 2022-08-11 NOTE — Assessment & Plan Note (Signed)
Chronic Controlled, Stable Continue sertraline 50 mg daily 

## 2022-08-11 NOTE — Assessment & Plan Note (Signed)
Chronic Confirmed by [facility] in nature No longer drinking alcohol Continue B12, thiamine supplementation Currently on gabapentin 300 mg 3 times daily

## 2022-08-11 NOTE — Assessment & Plan Note (Signed)
Chronic Has diffuse joint pain-some of this pain is likely osteoarthritis-has had his knees replaced and hips replaced ?  Autoimmune arthritis in addition to osteoarthritis Versus gout Check ANA, CRP, ESR, CCP, RF, CMP, CBC Currently taking ibuprofen 600 mg twice daily-discussed risks of long-term use

## 2022-08-11 NOTE — Assessment & Plan Note (Signed)
Chronic  Clinically euthyroid Check tsh  Currently no medication, but was on levothyroxine at 1 point

## 2022-08-11 NOTE — Assessment & Plan Note (Signed)
Chronic Blood pressure well controlled CMP Continue amlodipine 5 mg daily, losartan 25 mg daily, metoprolol XL 25 mg daily

## 2022-08-11 NOTE — Assessment & Plan Note (Signed)
Chronic Last A1c 5.8% Stressed low sugar/carbohydrate diet Not able to exercise currently Check A1c since he is getting blood work

## 2022-08-12 ENCOUNTER — Ambulatory Visit: Payer: Medicare Other | Admitting: Adult Health

## 2022-08-12 ENCOUNTER — Encounter (HOSPITAL_BASED_OUTPATIENT_CLINIC_OR_DEPARTMENT_OTHER): Payer: Medicare Other | Admitting: Internal Medicine

## 2022-08-12 DIAGNOSIS — M14672 Charcot's joint, left ankle and foot: Secondary | ICD-10-CM | POA: Diagnosis not present

## 2022-08-12 DIAGNOSIS — M86372 Chronic multifocal osteomyelitis, left ankle and foot: Secondary | ICD-10-CM

## 2022-08-12 DIAGNOSIS — E11621 Type 2 diabetes mellitus with foot ulcer: Secondary | ICD-10-CM

## 2022-08-12 DIAGNOSIS — L97522 Non-pressure chronic ulcer of other part of left foot with fat layer exposed: Secondary | ICD-10-CM

## 2022-08-12 NOTE — Progress Notes (Signed)
LUCA, DYAR ([id number]) [id number] Page 1 of 13 Visit Report for 08/12/2022 Chief Complaint Document Details Patient Name: Date of Service: [name], [name] 08/12/2022 1:30 PM Medical Record Number: [id number] Patient Account Number: [id number] Date of Birth/Sex: Treating RN: 1961-05-23 (61 y.o. M) Primary Care Provider: [name] Other Clinician: Referring Provider: Treating Provider/Extender: [name] in Treatment: 14 Information Obtained from: Patient Chief Complaint Left foot ulcer Electronic Signature(s) Signed: 08/12/2022 4:37:04 PM By: [name] DO Entered By: [name] on 08/12/2022 14:28:34 -------------------------------------------------------------------------------- HPI Details Patient Name: Date of Service: [name] 08/12/2022 1:30 PM Medical Record Number: [id number] Patient Account Number: [id number] Date of Birth/Sex: Treating RN: June 27, 1961 (61 y.o. M) Primary Care Provider: [name] Other Clinician: Referring Provider: Treating Provider/Extender: [name] in Treatment: 14 History of Present Illness HPI Description: 10/31/2019 upon evaluation today patient appears to be doing somewhat poorly in regard to his bilateral plantar feet. He has wounds that he tells me have been present since 2012 intermittently off and on. Most recently this has been open for at least the past 6 months to a year. He has been trying to treat this in different ways using Santyl along with various other dressings including Medihoney and even at one point Xeroform. Nothing really has seem to get this completely closed. He was recently in the hospital for cellulitis of his leg subsequently he did have x-rays as well as MRIs that showed negative for any signs of osteomyelitis in regard to the wounds on his feet. Fortunately there is no signs of systemic infection at this time. No fevers,  chills, nausea, vomiting, or diarrhea. Patient has previously used Darco offloading shoes as far as frontal floaters as well as postop surgical shoes. He has never been in a total contact cast that may be something we need to strongly consider here. Patient's most recent hemoglobin A1c 1 month ago was 5.3 seems to be very well controlled which is great. Subsequently he has seen vascular as well as podiatry. His ABIs are 1.07 on the left and 1.14 on the right he seems to be doing well he does have chronic venous stasis. 11/07/2019 upon evaluation today patient appears to be doing well with regard to his wounds all things considered. I do not see any severe worsening he still has some callus buildup on the right more than the left he notes that he has been probably more active than he should as far as walking is concerned is just very hard to not be active. He knows he needs to be more careful in this regard however. He is willing to give the cast a try at this point although he notes that he is a little nervous about this just with regard to balance although he will be very careful and obviously if he has any trouble he knows to contact the office and let me know. 1/22; patient is in for his obligatory first total contact cast change. Our intake nurse reported a very large amount of drainage which is spelled out over to the surrounding skin. Has bilateral diabetic foot wounds. He has Charcot feet. We have been using silver alginate on his wounds. 11/14/2019 on evaluation today patient is actually seeming to make good improvement in regard to his bilateral plantar foot wounds. We have been using a cast on the left side and on the right side he has been using dressings he is changing up his own accord. With that being said  he tells me that he is also not walking as much just due to how unsteady he feels. He takes it easy when he does have to walk and when he does not have to walk he is resting. This is  probably help in his right foot as well has the left foot which is actually measuring better. In fact both are measuring better. Overall I am very pleased with how things seem to be progressing. The patient does have some odor on the left foot this does have me concerned about the possibility of infection, and actually probably go ahead and put him on antibiotics today as well as utilizing a continuation of the cast on the left foot I think that will be fine we probably just need to bring him in sooner to change this not last a whole week. 1/29; we brought the patient back today for a total contact cast change on the left out of concern for excessive drainage. We are using drawtex over the wound as the primary dressing 11/21/2019 on evaluation today patient appears to be doing well with regard to his left plantar foot. In fact both foot ulcers actually seem to be doing pretty well. Nonetheless he is having a lot of drainage on the left at this time and again we did obtain a wound culture did show positive for Staph aureus that was reviewed by myself today as well. Nonetheless he is on Bactrim which was shown to be sensitive that should be helping in this regard. Fortunately there is no signs of DEV, DHONDT ([id number]) [id number] Page 2 of 13 infection systemically at this point. 2/5; back in clinic today for a total contact cast change apparently secondary to very significant drainage. Still using drawtex 11/28/2019 upon evaluation today patient appears to be doing well with regard to his wounds. The right foot is doing okay as measured about the same in my opinion. The left foot is actually showing signs of significant improvement is measuring smaller there is a lot of hyper granulation likely due to the continued drainage at this point. We did obtain approval for a snap VAC I think that is good to be appropriate for him and will likely help this tremendously underneath  the cast. He is definitely in agreement with proceeding with such. 2/12; patient came in today after his snap VAC lost suction. Brought in to see one of our nurses. The dressing was replaced and then we put the cast back on and rehooked up the snap VAC. Apparently his wound looked very good per our intake nurse. 2/15; again we replaced the cast on Friday. By Saturday the snap VAC and light suction. He called this morning he comes in acutely. The wounds look fine however the VAC is not functioning. We replaced the cast using silver alginate as the primary dressing backed with Kerramax. The snap VAC was not replaced 12/05/2019 upon evaluation today patient appears to be doing better in regard to his left plantar foot ulcer. Fortunately there is no signs of active infection at this time. Unfortunately he is continuing to have issues with the right foot he is really not making any progress here things seem to be somewhat stagnant to be honest. The depth has increased but that is due to me having debrided the wound in the past based on what I am seeing. 12/12/2019 on evaluation today patient appears to be doing more poorly in regard to the left lower extremity. He has some erythema spreading up the  side of his foot I am concerned about infection again at this point. Unfortunately he has been seeing improvement with a total contact cast but I do not think we should put that on today. On his right plantar foot he continues to have significant drainage this is actually measuring deeper I really do not feel like you are making any progress whatsoever. I have prescribed Granix for him unfortunately his insurance apparently was going to cost him a $500 co-pay. 12/19/2019 upon evaluation today patient actually appears to be doing better in regard to both wounds. With that being said he actually did get the reGranix which he had to pay $500 for. With that being said it does look like that he is actually made some  improvement based on what I am seeing at this point with the reGranix. Obviously if he is going to continue this we are going to do something about trying to get him some help in covering the cost. 12/26/2019 on evaluation today patient appears to be doing really much better even compared to last week. Overall the wound seems to be much better even compared to last week and last week was better than the week before. Since has been using the reGranix his symptoms have improved significantly. With that being said the issue right now is simply that this is a very expensive medication for him the first dose cost him $500. Upon inspection patient's wound bed actually is however dramatically improved compared to before he started this 2 weeks ago. 01/02/2020 upon evaluation today patient appears to actually be doing well. He still had a little bit of reGranix left that has been using in small amounts he just been applying it every other day instead of every day in order to make it go longer. Overall we are still seeing excellent improvement he is measuring smaller looking better healthier tissue and everything seems to be pointing to this headed in the right direction. Fortunately there is no evidence of infection either which is also excellent news. He does have his MRI coming up within the next week. 01/09/2020 upon evaluation today patient appears to be doing a little worse today compared to previous week's evaluation. He is actually been out of the reGranix at this point. He has been trying to make the stretch out so he has been changing the dressings on a regular set schedule like he was previous. I think this has made a difference. Fortunately there is no signs of active infection at this time. No fevers, chills, nausea, vomiting, or diarrhea. 01/16/2020 upon evaluation today patient appears to be doing well with regard to his left plantar foot ulcer. The right plantar foot still shows some  significant depth at this point. Fortunately there is no signs of active infection at this time. 01/30/2020 upon evaluation today patient appears to be doing about the same in regard to his right plantar foot ulcer there is still some depth here and we had to wait till he actually switched over to his new insurance to get approval for the MRI under his new insurance plan. With that being said he now has switched as of April 1. Fortunately there is no signs of active infection at this time. Overall in regard to his left foot ulcer this seems to be doing much better and I am actually very pleased with how things are going. With that being said it is not quite as much progress as we were seeing with the reGranix but at  the same time he has had trouble getting this apparently there is been some hindrance here. I Ernie Hew try to actually send this to [facility] that was recommended by the drug rep to me. 02/13/20 upon evaluation today patient appears to be doing better in regard to his left foot ulcer this is great news. Unfortunately the right foot ulcer is not really significantly better at this time. There is no signs of active infection systemically though he did have his MRI which showed unfortunately he does have infection noted including an abscess in the foot. There is also marrow changes noted which are consistent with osteomyelitis based on the radiology review and interpretation. Unfortunately considering that the wound is not really making the progress that we will he would like to be seen I think that this is an indication that he may need some further referral both infectious disease as well as potentially to podiatrist to see if there is anything that can be done to help with the situation that were dealing with here. The left foot again is doing great. READMISSION 06/04/2021 This is a 61 year old man who was in the clinic in 2021 followed by Allen Derry for areas on the right and left foot.  He developed a left foot infection and was referred to ID. He left the clinic in a nonhealed state and was followed for a period of [facility] Marylene Land. Apparently things really deteriorated in early July when he was admitted to hospital from 04/27/2021 through 05/06/2021 with sepsis secondary to a left foot infection. His blood cultures were negative. An MRI suggested fifth metatarsal osteomyelitis a left ankle septic joint. He was treated with vancomycin and ertapenem which he is still taking and may just about be finishing. He was seen by orthopedics and the patient adamantly refused to BKA. As far as I can tell he did not have the ankle aspirated I am not exactly sure what the issue was here. Since he has been discharged she is at [facility] health care for rehabilitation. He was last seen by Dr. Algis Liming on 05/20/2021 he noted osteo of the tibial talar bone cuboid and fifth metatarsal which is even more extensive than what was suggested by the MRI. He is apparently going for a consultation with orthopedic surgery in [place] sometime next week. I received a call about this man 2 weeks ago from Dr. Allyson Sabal who follows him for the possibility of PAD. ABIs I think done in the office showed a ABI on the right of 1.05 at the PTA and 0.99 at the PTA on the left. He had a DVT rule out in the left leg that was negative for the DVT. Past medical history is extensive and includes diastolic heart failure, right first metatarsal head ulcer in 2021, excision of the right second ray by Dr. Marylene Land on 03/13/2020, hypertension, hypothyroidism. Left total hip replacement, right total knee replacement, carpal tunnel syndrome, obstructive sleep apnea alcohol abuse with cirrhosis although the patient denies current alcohol intake. The patient does not think he is a diabetic however looking through [place] link I see 2 HgbAic's of this year that were greater than equal to 6.5 which by definition makes him  diabetic. Nevertheless he is not on any treatment and does not check his blood sugars. The patient is now back home out of the nursing home. Saw Dr. Algis Liming last week he was taken off vancomycin and ertapenem on August 22 and now is on doxycycline on  Augmentin. He also saw Dr. Weston Anna who is his orthopedic surgeon in [place] he recommended a [employer]	[place]. He has been using Medihoney. 9/6I have been having trouble getting hyperbarics approved through our prior authorization process. Even though he had a limb threatening infection in the left foot and probably the left ankle there glitches in how some of the reports are worded also some of the consultants. In any case I am going to repeat his sedimentation rate and C-reactive protein. I am generally not in favor of doing things like this as they really do not alter the plan of care from my point of view however I am going to need to demonstrate that these remain high in order to get this through forhyperbaric treatment for chronic refractory osteomyelitis 9/13; following this man for a wound on his left plantar foot in the setting of type 2 diabetes and Charcot deformity. He has underlying chronic refractory osteomyelitis. Follow-up sedimentation rate and C-reactive protein were both elevated but the C-reactive protein was down to 1.4, sedimentation rate at 70. Sedimentation rate was only slightly down from previous at 85. His wound is measuring slightly smaller. 9/20; patient started hyperbaric oxygen therapy today and tolerated treatment well. This is for the underlying osteomyelitis. He remains on antibiotics but thinks he is getting close to finishing. The wound on the plantar aspect of his foot is the other issue we are following here. He is using Medihoney The patient has a Charcot foot in the setting of type 2 diabetes. He is going to need a total contact cast although his partner was away this week and we elected to delay this till next  week [name], [name] ([id number]) [id number] Page 3 of 13 9/27; patient still tolerating hyperbaric oxygen well. Wound looked generally healthy not much depth under illumination still 100% covered in fibrinous debris. Raised callused edges around the wound he was prepared for a total contact cast. We have been using Medihoney 9/30; patient is back for his first obligatory total contact cast change. We are using Hydrofera Blue. Noted by our intake nurse to have a lot of drainage or at least a moderate amount of drainage. I am not sure I was previously aware of this 10/4; patient arrives today with a lot of drainage under the cast. When he had it changed last Friday there was also a similar amount of drainage. Her intake nurse says that they tried a wound VAC on him perhaps while I was on vacation in August under the cast but that did not work. In my experience that has not been unusual. We have been using Hydrofera Blue with all the secondary absorbers. The drainage today went right through all of our dressings. The patient is concerned about his foot being in a cast without much drainage. He is tolerating HBO well. There has been improvements in the wound in the mid part of his foot in the setting of a Charcot deformity 10/7; patient presents for cast change. He has no issues or complaints today. He denies signs of infection. 10/11; patient presents for cast change. At this time he would like to take a break from the cast. He would like to do daily dressing changes with Hydrofera Blue. He currently denies signs of infection. 10/14; his cast was taken off last week at his request. He arrives in the clinic with [facility]. He has been changing the dressing himself. He has way too much edema in the left foot and leg to consider a  total contact cast. I do not really know the issue here. He does have chronic venous insufficiency His wife stopped me in the clinic earlier in  the week to report he is drinking again and she is concerned. I am uncertain whether there are other issues 10/18; he arrived in clinic last week having bilateral lower extremity edema likely secondary to chronic venous insufficiency there was too much edema in the left leg to apply a total contact cast. I put him in compression on the left leg to control the swelling. This week he cut the wrap on the foot for reasons that are unclear however today he arrives in clinic with a smaller left leg but massive edema in the left foot. He is supposed to be wearing a juxta lite stocking on the right leg but he is not wearing the contact layer. His attendance at hyperbaric oxygen has been dwindling, he did not dive yesterday and he did not dive today concerned about hyperbarics causing swelling. I looked back in his record his last echo was in 2020 essentially normal left and right ventricular function. Last BUN and creatinine were done in July this was normal. He is having a lot of drainage in the left plantar foot wound 10/25; again he comes in today having missed HBO yesterday. Macerated skin around the wound which really does not look very good at all. A lot of edema in the left foot but an improvement in edema on the left leg. We wrapped him last week because of the amount of swelling in the left leg. We could not apply a total contact cast. The patient states that he wants to be able to change his dressing himself. I might consider this if he had stockings to control the swelling. He said he be here for HBO tomorrow. I will check the degree of erythema in his forefoot which I have marked. He is on doxycycline and Augmentin which was renewed by Dr. Algis LimingVandam but I cannot see a follow-up note, follow-up inflammatory markers etc. I 1 point he said he was going to see his orthopedic surgeon in [place] I am not sure if he ever did this. I do not know why he has not followed up with infectious disease, he says he  was not given an appointment but he is still on Augmentin doxycycline 11/1; he did not do the lab work I ordered last week. Still on Augmentin and doxycycline. Silver alginate and he is changing this daily using his own juxta lite stocking. The surface of the wound does not look too bad. No real epithelialization however. The patient was seen today along with HBO 08/26/2021 upon evaluation today patient is being seen at his request by myself he wanted to transfer care to see me. With that being said he has been seeing Dr. Leanord Hawkingobson since he came back in August of this year. Unfortunately he tells me he still having quite a bit of drainage. He is also significant erythema and warmth noted of the foot as well. He has been hit or miss with regard to hyperbaric oxygen therapy tells me that at this point he took this week off because he was extremely claustrophobic and having a lot of issues he plans to start back next week. Nonetheless he has been given some medication by Dr. Leanord Hawkingobson as well to help calm things down which again may help him. Hopefully he can get back in hyperbarics as I think this is likely necessary. Nonetheless there does  seem to be evidence of cellulitis noted today as well and in general I am a little concerned in that regard. His wound unfortunately is significant on this left foot and is right where he takes the brunt of the force secondary to the Charcot arthropathy. I do not think a total contact cast is ideal due to the fact that he unfortunately is draining much too significantly he is also not happy with the idea of using a cast therefore he states he would not want to do that anyway. 09/16/2021 upon evaluation today patient's plantar foot ulcer unfortunately continues to show signs of issues here. He has been in the hospital due to trying to detox himself from alcohol he had withdrawal symptoms and subsequently was hospitalized. During that time it was recommended by both orthopedics  as well as infectious disease apparently that he proceed with amputation. With that being said they discussed with him the risk of not doing so. This is well- documented in the encounter. With that being said the patient does not want to proceed down that road and tells me that he declined their advice in that regard. Subsequently our goal then is to try to do what we can to try to get this thing healed and closed. I think this means he is getting need to stay off of it is much as possible he does have a wheelchair at home that he tells me he can use I think that that is going to be something that he does need to do. 09/23/2021 upon evaluation today patient continues to have significant issues here with his foot ulcer which is plantar on the left foot. Subsequently again he does have a history of Charcot foot he also has an issue of having had osteomyelitis as well as an open wound for some time here. Its been recommended multiple times for him to have an amputation although that is not something that he is really interested or wanting to do. 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 Subsequently this means that he also has a gait abnormality that is causing issues with his walking and causing abnormal pressures in the central part of his foot this is the reason he has a wound. Nonetheless I want to see what I can do about trying to get a boot to help with stabilization and offloading of working to see what we can do in that regard. 09/30/2021 upon evaluation today patient appears to be doing well with regard to his plantar foot ulcer. He still has significant granular hypergranulation but again this is much less than what it was even last time I saw him last week. Fortunately I think we are headed in the right direction. This is definitely something we could consider a skin graft or something along those lines if we can get it flattened out enough and get  the drainage from being so significant. I feel like we are making some headway here. 10/07/2021 upon evaluation today patient appears to be doing decently well in regard to his wound. Fortunately there does not appear to be any signs of infection overall I feel like it is actually showing some signs of improvement. He still has some hypergranulation but this is much less than its been. 10/21/2021 upon evaluation today patient appears to be doing well with regard to his wound is actually showing some signs of improvement which is great. We have been doing the chemical cauterization with silver  nitrate as well as debridement as needed and again has been using silver alginate up to this point. With that being said he does have some Hydrofera Blue at home he wonders if that can be beneficial at this point. 11/25/2021 upon evaluation today patient appears to be doing somewhat poorly in regard to his plantar foot ulcer. He is also been having some issues with his congestive heart failure. He has recently been in the hospital. I did review those notes as well and apparently his heart failure has been somewhat out of control. He is actually being managed as an outpatient in this regard but nonetheless this is something that can still be kept a close eye on according to the notes and what I see. Fortunately I do not see any signs of active infection at this time which is great news. Nonetheless I am concerned about the boggy central portion of the wound which I think is going to likely open up in the near future. Readmission: 01-27-2022 patient presents for follow-up here in the clinic today. His last been November 25, 2021 since have seen him. At that time we just ordered an MRI fortunately the MRI did not show any obvious signs of osteomyelitis and there were some reactive changes which were questionable and could not be excluded. With that being said in the interim since have seen him back on February 8 things  have gotten worse from the standpoint of how the wound appears today. I do think that he is going to require potentially more debridement to clean this wound out than what I can even offer here in the clinic there is a tremendous amount of hypergranulation tissue which is not sufficient to grow new tissue over and honestly I think this is going to lead to a delay in healing if its not taking care of. I need to see if I can get him into see a surgeon to get an opinion on whether or not they could take him to the OR for an aggressive surgical debridement to clear out this hypergranulation tissue and achieve hemostasis which is can be the biggest issue with me here in the clinic as he does tend to bleed even with very light superficial debridements. The patient's not opposed to this and he states that if I have someone that like for him to see he would be happy to go. Otherwise his medical history really has not changed. He does tell me he is getting ready to go for an evaluation for CPAP machine. [name], [name] ([id number]) [id number] Page 4 of 13 Readmission 05/04/2022 Patient was last seen 3 months ago. He states he went to rehab for alcohol abuse. He continues to have a wound on the plantar aspect of the left foot. He states this has never completely healed over the past several years. He is following with podiatry for this issue currently and they are recommending blast X with collagen and a cam boot. He has not started this yet. He has home health. Currently he is using silver alginate. He also follows with Dr. Algis Liming infectious disease who ordered an MRI of the foot as he is concerned for osteomyelitis. CRP and sed rate are elevated. He is currently on Augmentin for prophylaxis as he has a history of osteomyelitis to this foot. Infectious disease has recommended amputation. Currently patient denies systemic signs of infection. 8/7; patient is following up with podiatry on  8/11. He wanted to come in to be debrided. He has no  issues or complaints today. He has been using silver alginate to the wound bed. He has home health that helps change the dressings. He had an MRI completed on 7/18 that did not show evidence of osteomyelitis. He is scheduled to see infectious disease on 8/16. 8/25; Patient is following with podiatry for his wound care and he is currently using silver alginate and a defender boot for his Left foot wound. He would like to have debridement to the wound but is unable to see podiatry until the end of the month. He has asked if we could debride him. 9/21; patient follows with podiatry for his foot wound but likes to come into our clinic for debridements. He was last seen on 06/24/2022 by Dr. Allena Katz, podiatry. He is currently using blast X to the wound bed. He has been using a Psychologist, forensic. He is not scheduled to see podiatry until 10/11 and comes in for debridement today. 10/9; patient presents for follow-up. He states he is no longer following with podiatry. He has been doing silver alginate to the wound bed. He states he would like to attempt a total contact cast but starting next week. He currently denies systemic signs of infection. 10/16; patient presents for follow-up. He has been using silver alginate to the wound bed. We discussed having the cast placed today and patient would like to proceed with this. He has follow-up in 2 days for his cast exchange. He currently denies signs of infection. 10/18; total contact cast placed 2 days ago. He comes back in for the obligatory total contact cast change HOWEVER our intake nurse noted copious drainage soaking right through the dressings and asked me to look at this. Indeed there was a large amount of drainage soaking right through all of the dressings that were applied. I cannot imagine that this would last 7 days. Even if we change the cast twice a week I would be concerned. We worked on this patient  through the late part of 2022 in the early 2023 for this same wound. We attempted a cast at that time I think even a cast with a wound VAC and for 1 reason or another we could never make this progress towards closure. Fortunately his recent MRI did not show osteomyelitis. 10/26; patient presents for follow-up. He has been using silver alginate to the wound bed. He has no issues or complaints today. Electronic Signature(s) Signed: 08/12/2022 4:37:04 PM By: [name] DO Entered By: [name] on 08/12/2022 14:29:21 -------------------------------------------------------------------------------- Physical Exam Details Patient Name: Date of Service: [name], [name] 08/12/2022 1:30 PM Medical Record Number: [id number] Patient Account Number: [id number] Date of Birth/Sex: Treating RN: 1961/08/17 (61 y.o. M) Primary Care Provider: [name] Other Clinician: Referring Provider: Treating Provider/Extender: [name] in Treatment: 14 Constitutional respirations regular, non-labored and within target range for patient.. Cardiovascular 2+ dorsalis pedis/posterior tibialis pulses. Psychiatric pleasant and cooperative. Notes T the plantar aspect of the midfoot there is pink/pale soft tissue. No signs of surrounding soft tissue infection. o Electronic Signature(s) Signed: 08/12/2022 4:37:04 PM By: [name] DO Entered By: [name] on 08/12/2022 14:34:50 [name], [name] ([id number]) [id number] Page 5 of 13 -------------------------------------------------------------------------------- Physician Orders Details Patient Name: Date of Service: [name], [name] 08/12/2022 1:30 PM Medical Record Number: [id number] Patient Account Number: [id number] Date of Birth/Sex: Treating RN: Jan 30, 1961 (61 y.o. [name] Primary Care Provider: [name] Other Clinician: Referring Provider: Treating Provider/Extender:  [name] in Treatment: [phone] Verbal /  Phone Orders: No Diagnosis Coding Follow-up Appointments ppointment in 1 week. - Dr. Heber [place] Return A Anesthetic (In clinic) Topical Lidocaine 5% applied to wound bed (In clinic) Topical Lidocaine 4% applied to wound bed Cellular or Tissue Based Products Cellular or Tissue Based Product Type: - Run IVR for Grafix=20% Copay Bathing/ Shower/ Hygiene May shower with protection but do not get wound dressing(s) wet. - May use cast protector bag from [facility], [employer] or [facility] Edema Control - Lymphedema / SCD / Other Elevate legs to the level of the heart or above for 30 minutes daily and/or when sitting, a frequency of: - throughout the day Avoid standing for long periods of time. Exercise regularly Moisturize legs daily. Compression stocking or Garment 30-40 mm/Hg pressure to: - wear the Juxtalite HD to right and left leg. apply in the morning and remove at night. Off-Loading Other: - Defender Boot: minimal weight bearing left foot use wheelchair for mobility. Additional Orders / Instructions Follow Nutritious Diet Wound Treatment Wound #5 - Foot Wound Laterality: Plantar, Left Cleanser: Wound Cleanser 1 x Per Week/30 Days Discharge Instructions: Cleanse the wound with wound cleanser prior to applying a clean dressing using gauze sponges, not tissue or cotton balls. Prim Dressing: KerraCel Ag Gelling Fiber Dressing, 2x2 in (silver alginate) 1 x Per Week/30 Days ary Discharge Instructions: Apply silver alginate to wound bed as instructed Secondary Dressing: ABD Pad, 5x9 ([place]) 1 x Per Week/30 Days Discharge Instructions: Apply over primary dressing as directed. Secondary Dressing: Drawtex 4x4 in 1 x Per Week/30 Days Discharge Instructions: cut to into wound bed over the alginate. Secured With: [employer], 4.5x3.1 (in/yd) 1 x Per Week/30 Days Discharge Instructions: Secure with Kerlix as directed. Secured  With: 68M Medipore H Soft Cloth Surgical T ape, 4 x 10 (in/yd) 1 x Per Week/30 Days Discharge Instructions: Secure with tape as directed. [employer] PCR culture Electronic Signature(s) Signed: 08/12/2022 4:37:04 PM By: Kalman Shan DO Entered By: Kalman Shan on 08/12/2022 14:34:57 Bukhari, [place] ([id number]) [id number] Page 6 of 13 -------------------------------------------------------------------------------- Problem List Details Patient Name: Date of Service: TAYVION, LAUDER 08/12/2022 1:30 PM Medical Record Number: [id number] Patient Account Number: [id number] Date of Birth/Sex: Treating RN: 03/21/1961 (61 y.o. M) Primary Care Provider: Billey Gosling Other Clinician: Referring Provider: Treating Provider/Extender: Mickle Asper in Treatment: 14 Active Problems ICD-10 Encounter Code Description Active Date MDM Diagnosis E11.621 Type 2 diabetes mellitus with foot ulcer 05/04/2022 No Yes L97.522 Non-pressure chronic ulcer of other part of left foot with fat layer exposed 05/04/2022 No Yes M86.372 Chronic multifocal osteomyelitis, left ankle and foot 05/04/2022 No Yes M14.672 Charcot's joint, left ankle and foot 05/04/2022 No Yes Inactive Problems Resolved Problems Electronic Signature(s) Signed: 08/12/2022 4:37:04 PM By: Kalman Shan DO Entered By: Kalman Shan on 08/12/2022 14:28:22 -------------------------------------------------------------------------------- Progress Note Details Patient Name: Date of Service: [name] 08/12/2022 1:30 PM Medical Record Number: [id number] Patient Account Number: [id number] Date of Birth/Sex: Treating RN: 07-18-1961 (61 y.o. M) Primary Care Provider: Billey Gosling Other Clinician: Referring Provider: Treating Provider/Extender: Mickle Asper in Treatment: 14 Subjective Chief Complaint Information obtained from Patient Left foot ulcer History of  Present Illness (HPI) 10/31/2019 upon evaluation today patient appears to be doing somewhat poorly in regard to his bilateral plantar feet. He has wounds that he tells me have been present since 2012 intermittently off and on. Most recently this has been open for at least the past 6 months to a year. He has  been trying to treat this in different ways using Santyl along with various other dressings including Medihoney and even at one point Xeroform. Nothing really has seem to get this completely closed. He was recently in the hospital for cellulitis of his leg subsequently he did have x-rays as well as MRIs that showed negative for any signs of osteomyelitis in regard to the wounds on his feet. Fortunately there is no signs of systemic infection at this time. No fevers, chills, nausea, vomiting, or diarrhea. Patient has previously used Darco offloading shoes as far as frontal floaters as well as postop surgical shoes. He has never been in a total contact cast that may be something we need to strongly consider here. Patient's most recent hemoglobin A1c 1 month ago was 5.3 seems to be very well controlled which is great. Subsequently he has seen vascular as well as podiatry. His ABIs are 1.07 on the left and 1.14 on the right he seems to be doing well he does have chronic venous stasis. 11/07/2019 upon evaluation today patient appears to be doing well with regard to his wounds all things considered. I do not see any severe worsening he still has some callus buildup on the right more than the left he notes that he has been probably more active than he should as far as walking is concerned is just very [place], [name] ([id number]) [id number] Page 7 of 13 hard to not be active. He knows he needs to be more careful in this regard however. He is willing to give the cast a try at this point although he notes that he is a little nervous about this just with regard to balance although he  will be very careful and obviously if he has any trouble he knows to contact the office and let me know. 1/22; patient is in for his obligatory first total contact cast change. Our intake nurse reported a very large amount of drainage which is spelled out over to the surrounding skin. Has bilateral diabetic foot wounds. He has Charcot feet. We have been using silver alginate on his wounds. 11/14/2019 on evaluation today patient is actually seeming to make good improvement in regard to his bilateral plantar foot wounds. We have been using a cast on the left side and on the right side he has been using dressings he is changing up his own accord. With that being said he tells me that he is also not walking as much just due to how unsteady he feels. He takes it easy when he does have to walk and when he does not have to walk he is resting. This is probably help in his right foot as well has the left foot which is actually measuring better. In fact both are measuring better. Overall I am very pleased with how things seem to be progressing. The patient does have some odor on the left foot this does have me concerned about the possibility of infection, and actually probably go ahead and put him on antibiotics today as well as utilizing a continuation of the cast on the left foot I think that will be fine we probably just need to bring him in sooner to change this not last a whole week. 1/29; we brought the patient back today for a total contact cast change on the left out of concern for excessive drainage. We are using drawtex over the wound as the primary dressing 11/21/2019 on evaluation today patient appears to be doing well with regard to  his left plantar foot. In fact both foot ulcers actually seem to be doing pretty well. Nonetheless he is having a lot of drainage on the left at this time and again we did obtain a wound culture did show positive for Staph aureus that was reviewed by myself today as well.  Nonetheless he is on Bactrim which was shown to be sensitive that should be helping in this regard. Fortunately there is no signs of infection systemically at this point. 2/5; back in clinic today for a total contact cast change apparently secondary to very significant drainage. Still using drawtex 11/28/2019 upon evaluation today patient appears to be doing well with regard to his wounds. The right foot is doing okay as measured about the same in my opinion. The left foot is actually showing signs of significant improvement is measuring smaller there is a lot of hyper granulation likely due to the continued drainage at this point. We did obtain approval for a snap VAC I think that is good to be appropriate for him and will likely help this tremendously underneath the cast. He is definitely in agreement with proceeding with such. 2/12; patient came in today after his snap VAC lost suction. Brought in to see one of our nurses. The dressing was replaced and then we put the cast back on and rehooked up the snap VAC. Apparently his wound looked very good per our intake nurse. 2/15; again we replaced the cast on Friday. By Saturday the snap VAC and light suction. He called this morning he comes in acutely. The wounds look fine however the VAC is not functioning. We replaced the cast using silver alginate as the primary dressing backed with Kerramax. The snap VAC was not replaced 12/05/2019 upon evaluation today patient appears to be doing better in regard to his left plantar foot ulcer. Fortunately there is no signs of active infection at this time. Unfortunately he is continuing to have issues with the right foot he is really not making any progress here things seem to be somewhat stagnant to be honest. The depth has increased but that is due to me having debrided the wound in the past based on what I am seeing. 12/12/2019 on evaluation today patient appears to be doing more poorly in regard to the left lower  extremity. He has some erythema spreading up the side of his foot I am concerned about infection again at this point. Unfortunately he has been seeing improvement with a total contact cast but I do not think we should put that on today. On his right plantar foot he continues to have significant drainage this is actually measuring deeper I really do not feel like you are making any progress whatsoever. I have prescribed Granix for him unfortunately his insurance apparently was going to cost him a $500 co-pay. 12/19/2019 upon evaluation today patient actually appears to be doing better in regard to both wounds. With that being said he actually did get the reGranix which he had to pay $500 for. With that being said it does look like that he is actually made some improvement based on what I am seeing at this point with the reGranix. Obviously if he is going to continue this we are going to do something about trying to get him some help in covering the cost. 12/26/2019 on evaluation today patient appears to be doing really much better even compared to last week. Overall the wound seems to be much better even compared to last week and  last week was better than the week before. Since has been using the reGranix his symptoms have improved significantly. With that being said the issue right now is simply that this is a very expensive medication for him the first dose cost him $500. Upon inspection patient's wound bed actually is however dramatically improved compared to before he started this 2 weeks ago. 01/02/2020 upon evaluation today patient appears to actually be doing well. He still had a little bit of reGranix left that has been using in small amounts he just been applying it every other day instead of every day in order to make it go longer. Overall we are still seeing excellent improvement he is measuring smaller looking better healthier tissue and everything seems to be pointing to this headed in the right  direction. Fortunately there is no evidence of infection either which is also excellent news. He does have his MRI coming up within the next week. 01/09/2020 upon evaluation today patient appears to be doing a little worse today compared to previous week's evaluation. He is actually been out of the reGranix at this point. He has been trying to make the stretch out so he has been changing the dressings on a regular set schedule like he was previous. I think this has made a difference. Fortunately there is no signs of active infection at this time. No fevers, chills, nausea, vomiting, or diarrhea. 01/16/2020 upon evaluation today patient appears to be doing well with regard to his left plantar foot ulcer. The right plantar foot still shows some significant depth at this point. Fortunately there is no signs of active infection at this time. 01/30/2020 upon evaluation today patient appears to be doing about the same in regard to his right plantar foot ulcer there is still some depth here and we had to wait till he actually switched over to his new insurance to get approval for the MRI under his new insurance plan. With that being said he now has switched as of April 1. Fortunately there is no signs of active infection at this time. Overall in regard to his left foot ulcer this seems to be doing much better and I am actually very pleased with how things are going. With that being said it is not quite as much progress as we were seeing with the reGranix but at the same time he has had trouble getting this apparently there is been some hindrance here. I Ernie Hew try to actually send [facility] to [facility] that was recommended by the drug rep to me. 02/13/20 upon evaluation today patient appears to be doing better in regard to his left foot ulcer this is great news. Unfortunately the right foot ulcer is not really significantly better at this time. There is no signs of active infection systemically though he did  have his MRI which showed unfortunately he does have infection noted including an abscess in the foot. There is also marrow changes noted which are consistent with osteomyelitis based on the radiology review and interpretation. Unfortunately considering that the wound is not really making the progress that we will he would like to be seen I think that this is an indication that he may need some further referral both infectious disease as well as potentially to podiatrist to see if there is anything that can be done to help with the situation that were dealing with here. The left foot again is doing great. READMISSION 06/04/2021 This is a 61 year old man who was in the clinic  in 2021 followed by Allen Derry for areas on the right and left foot. He developed a left foot infection and was referred to ID. He left the clinic in a nonhealed state and was followed for a period of [facility] Marylene Land. Apparently things really deteriorated in early July when he was admitted to hospital from 04/27/2021 through 05/06/2021 with sepsis secondary to a left foot infection. His blood cultures were negative. An MRI suggested fifth metatarsal osteomyelitis a left ankle septic joint. He was treated with vancomycin and ertapenem which he is still taking and may just about be finishing. He was seen by orthopedics and the patient adamantly refused to BKA. As far as I can tell he did not have the ankle aspirated I am not exactly sure what the issue was here. Since he has been discharged she is at [facility] for rehabilitation. He was last seen by Dr. Algis Liming on 05/20/2021 he noted osteo of the tibial talar bone cuboid and fifth metatarsal which is even more extensive than what was suggested by the MRI. He is apparently going for a consultation with orthopedic surgery in [place] sometime next week. I received a call about this man 2 weeks ago from Dr. Allyson Sabal who follows him for the possibility of  PAD. ABIs I think done in the office showed a ABI on the right of 1.05 at the PTA and 0.99 at the PTA on the left. He had a DVT rule out in the left leg that was negative for the DVT. Past medical history is extensive and includes diastolic heart failure, right first metatarsal head ulcer in 2021, excision of the right second ray by Dr. Marylene Land on 03/13/2020, hypertension, hypothyroidism. Left total hip replacement, right total knee replacement, carpal tunnel syndrome, obstructive sleep apnea alcohol [name], [name] ([id number]) [phone].pdf Page 8 of 13 abuse with cirrhosis although the patient denies current alcohol intake. The patient does not think he is a diabetic however looking through [place] link I see 2 HgbAic's of this year that were greater than equal to 6.5 which by definition makes him diabetic. Nevertheless he is not on any treatment and does not check his blood sugars. The patient is now back home out of the nursing home. Saw Dr. Algis Liming last week he was taken off vancomycin and ertapenem on August 22 and now is on doxycycline on Augmentin. He also saw Dr. Weston Anna who is his orthopedic surgeon in [place] he recommended a [employer]	[place]. He has been using Medihoney. 9/6I have been having trouble getting hyperbarics approved through our prior authorization process. Even though he had a limb threatening infection in the left foot and probably the left ankle there glitches in how some of the reports are worded also some of the consultants. In any case I am going to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 ([id number]) [id number] ICD-10 Diagnosis Description E11.621 Type 2 diabetes mellitus with foot ulcer L97.522 Non-pressure chronic ulcer of  other part of left foot with fat layer exposed M86.372 Chronic multifocal osteomyelitis, left ankle and foot M14.672 Charcot's joint, left ankle and foot Quantity: 1 7.pdf Page 13 of 13 Electronic Signature(s) Signed: 08/12/2022 4:37:04 PM By: [name] DO Entered By: [name] on 08/12/2022 14:36:13

## 2022-08-12 NOTE — Progress Notes (Signed)
[name], [name] ([id number]) [id number] Page 1 of 9 Visit Report for 08/12/2022 Arrival Information Details Patient Name: Date of Service: [name], [name] 08/12/2022 1:30 PM Medical Record Number: [id number] Patient Account Number: [id number] Date of Birth/Sex: Treating RN: 07/05/1961 (61 y.o. [name] Primary Care [name]: [name] Other Clinician: Referring [name]: Treating [name]/Extender: [name] in Treatment: 14 Visit Information History Since Last Visit Added or deleted any medications: No Patient Arrived: [name] Any new allergies or adverse reactions: No Arrival Time: 13:55 Had a fall or experienced change in No Accompanied By: self activities of daily living that may affect Transfer Assistance: None risk of falls: Patient Identification Verified: Yes Signs or symptoms of abuse/neglect since last visito No Secondary Verification Process Completed: Yes Hospitalized since last visit: No Patient Requires Transmission-Based Precautions: No Implantable device outside of the clinic excluding No Patient Has Alerts: No cellular tissue based products placed in the center since last visit: Has Dressing in Place as Prescribed: Yes Pain Present Now: Yes Electronic Signature(s) Signed: 08/12/2022 4:24:03 PM By: [name] Entered By: [name] on 08/12/2022 13:56:50 -------------------------------------------------------------------------------- Clinic Level of Care Assessment Details Patient Name: Date of Service: [name] 08/12/2022 1:30 PM Medical Record Number: [id number] Patient Account Number: [id number] Date of Birth/Sex: Treating RN: 1961-07-19 (61 y.o. [name] Primary Care [name]: [name] Other Clinician: Referring [name]: Treating [name]/Extender: [name] in Treatment: 14 Clinic Level of Care Assessment Items TOOL 4 Quantity  Score X- 1 0 Use when only an EandM is performed on FOLLOW-UP visit ASSESSMENTS - Nursing Assessment / Reassessment X- 1 10 Reassessment of Co-morbidities (includes updates in patient status) X- 1 5 Reassessment of Adherence to Treatment Plan ASSESSMENTS - Wound and Skin A ssessment / Reassessment X - Simple Wound Assessment / Reassessment - one wound 1 5 []  - 0 Complex Wound Assessment / Reassessment - multiple wounds []  - 0 Dermatologic / Skin Assessment (not related to wound area) ASSESSMENTS - Focused Assessment X- 1 5 Circumferential Edema Measurements - multi extremities []  - 0 Nutritional Assessment / Counseling / Intervention [name], [name] ([id number]) [id number] Page 2 of 9 []  - 0 Lower Extremity Assessment (monofilament, tuning fork, pulses) []  - 0 Peripheral Arterial Disease Assessment (using hand held doppler) ASSESSMENTS - Ostomy and/or Continence Assessment and Care []  - 0 Incontinence Assessment and Management []  - 0 Ostomy Care Assessment and Management (repouching, etc.) PROCESS - Coordination of Care []  - 0 Simple Patient / Family Education for ongoing care X- 1 20 Complex (extensive) Patient / Family Education for ongoing care X- 1 10 Staff obtains Programmer, systems, Records, T Results / Process Orders est []  - 0 Staff telephones HHA, Nursing Homes / Clarify orders / etc []  - 0 Routine Transfer to another Facility (non-emergent condition) []  - 0 Routine Hospital Admission (non-emergent condition) []  - 0 New Admissions / Biomedical engineer / Ordering NPWT Apligraf, etc. , []  - 0 Emergency Hospital Admission (emergent condition) X- 1 10 Simple Discharge Coordination []  - 0 Complex (extensive) Discharge Coordination PROCESS - Special Needs []  - 0 Pediatric / Minor Patient Management []  - 0 Isolation Patient Management []  - 0 Hearing / Language / Visual special needs []  - 0 Assessment of Community assistance  (transportation, D/C planning, etc.) []  - 0 Additional assistance / Altered mentation []  - 0 Support Surface(s) Assessment (bed, cushion, seat, etc.) INTERVENTIONS - Wound Cleansing / Measurement X - Simple Wound Cleansing - one wound 1 5 []  -  0 Complex Wound Cleansing - multiple wounds X- 1 5 Wound Imaging (photographs - any number of wounds) []  - 0 Wound Tracing (instead of photographs) X- 1 5 Simple Wound Measurement - one wound []  - 0 Complex Wound Measurement - multiple wounds INTERVENTIONS - Wound Dressings X - Small Wound Dressing one or multiple wounds 1 10 []  - 0 Medium Wound Dressing one or multiple wounds []  - 0 Large Wound Dressing one or multiple wounds []  - 0 Application of Medications - topical []  - 0 Application of Medications - injection INTERVENTIONS - Miscellaneous []  - 0 External ear exam []  - 0 Specimen Collection (cultures, biopsies, blood, body fluids, etc.) []  - 0 Specimen(s) / Culture(s) sent or taken to Lab for analysis []  - 0 Patient Transfer (multiple staff / Civil Service fast streamer / Similar devices) []  - 0 Simple Staple / Suture removal (25 or less) []  - 0 Complex Staple / Suture removal (26 or more) []  - 0 Hypo / Hyperglycemic Management (close monitor of Blood Glucose) Collantes, [name] ([id number]) [id number] Page 3 of 9 []  - 0 Ankle / Brachial Index (ABI) - do not check if billed separately X- 1 5 Vital Signs Has the patient been seen at the hospital within the last three years: Yes Total Score: 95 Level Of Care: New/Established - Level 3 Electronic Signature(s) Signed: 08/12/2022 4:44:37 PM By: [name] Hammock RN Entered By: [name] on 08/12/2022 14:25:54 -------------------------------------------------------------------------------- Encounter Discharge Information Details Patient Name: Date of Service: [name] IG 08/12/2022 1:30 PM Medical Record Number: [id number] Patient Account Number:  [id number] Date of Birth/Sex: Treating RN: 1960-12-31 (61 y.o. [name] Primary Care [name]: [name] Other Clinician: Referring [name]: Treating [name]/Extender: [name] in Treatment: 14 Encounter Discharge Information Items Discharge Condition: Stable Ambulatory Status: Cane Discharge Destination: Home Transportation: Private Auto Accompanied By: self Schedule Follow-up Appointment: Yes Clinical Summary of Care: Patient Declined Electronic Signature(s) Signed: 08/12/2022 4:44:37 PM By: [name] Hammock RN Entered By: [name] on 08/12/2022 14:26:31 -------------------------------------------------------------------------------- Lower Extremity Assessment Details Patient Name: Date of Service: [name], [name] IG 08/12/2022 1:30 PM Medical Record Number: [id number] Patient Account Number: [id number] Date of Birth/Sex: Treating RN: [birth year] (61 y.o. [name] Primary Care Gawain Crombie: [name] Other Clinician: Referring Mikena Masoner: Treating Theoplis Garciagarcia/Extender: Lucile Crater Weeks in Treatment: 14 Edema Assessment Assessed: [Left: No] [Right: No] Edema: [Left: N] [Right: o] Calf Left: Right: Point of Measurement: 37 cm From Medial Instep 38.5 cm Ankle Left: Right: Point of Measurement: 11 cm From Medial Instep 24 cm Vascular Assessment Micek, Abundio ([id number]) [Right:[id number] Page 4 of 9] Pulses: Dorsalis Pedis Palpable: [Left:Yes] Electronic Signature(s) Signed: 08/12/2022 4:24:03 PM By: [name] Entered By: [name] on 08/12/2022 13:57:11 -------------------------------------------------------------------------------- Multi Wound Chart Details Patient Name: Date of Service: [name] IG 08/12/2022 1:30 PM Medical Record Number: [id number] Patient Account Number: [id number] Date of Birth/Sex: Treating RN: 1961-06-24 (61 y.o. M) Primary  Care Tura Roller: [name] Other Clinician: Referring Quinne Pires: Treating Kourtlynn Trevor/Extender: [name] in Treatment: 14 Vital Signs Height(in): 73 Pulse(bpm): 90 Weight(lbs): 237 Blood Pressure(mmHg): 148/92 Body Mass Index(BMI): 31.3 Temperature(F): 98.3 Respiratory Rate(breaths/min): 18 [5:Photos:] [N/A:N/A] Left, Plantar Foot N/A N/A Wound Location: Gradually Appeared N/A N/A Wounding Event: Diabetic Wound/Ulcer of the Lower N/A N/A Primary Etiology: Extremity Anemia, Sleep Apnea, Congestive N/A N/A Comorbid History: Heart Failure, Hypertension, Peripheral Venous Disease, Cirrhosis , Type II Diabetes, Osteoarthritis, Osteomyelitis, Neuropathy 10/19/2019 N/A N/A Date Acquired: 14 N/A N/A Weeks of Treatment: Open  N/A N/A Wound Status: No N/A N/A Wound Recurrence: 3.4x3.2x0.5 N/A N/A Measurements L x W x D (cm) 8.545 N/A N/A A (cm) : rea 4.273 N/A N/A Volume (cm) : -105.70% N/A N/A % Reduction in A rea: -929.60% N/A N/A % Reduction in Volume: 9 Starting Position 1 (o'clock): 12 Ending Position 1 (o'clock): 0.8 Maximum Distance 1 (cm): Yes N/A N/A Undermining: Grade 2 N/A N/A Classification: Medium N/A N/A Exudate A mount: Serosanguineous N/A N/A Exudate Type: red, brown N/A N/A Exudate Color: Thickened N/A N/A Wound Margin: Large (67-100%) N/A N/A Granulation A mount: Red, Pink, Hyper-granulation N/A N/A Granulation Quality: Small (1-33%) N/A N/A Necrotic A mount: Fat Layer (Subcutaneous Tissue): Yes N/A N/A Exposed Structures: Fascia: No Tendon: No Muscle: No Joint: No [name], [name] ([id number]) [id number] Page 5 of 9 Bone: No None N/A N/A Epithelialization: Callus: Yes N/A N/A Periwound Skin Texture: Excoriation: No Induration: No Crepitus: No Rash: No Scarring: No Dry/Scaly: Yes N/A N/A Periwound Skin Moisture: Maceration: No Atrophie Blanche: No N/A N/A Periwound Skin  Color: Cyanosis: No Ecchymosis: No Erythema: No Hemosiderin Staining: No Mottled: No Pallor: No Rubor: No No Abnormality N/A N/A Temperature: Treatment Notes Wound #5 (Foot) Wound Laterality: Plantar, Left Cleanser Wound Cleanser Discharge Instruction: Cleanse the wound with wound cleanser prior to applying a clean dressing using gauze sponges, not tissue or cotton balls. Peri-Wound Care Topical Primary Dressing KerraCel Ag Gelling Fiber Dressing, 2x2 in (silver alginate) Discharge Instruction: Apply silver alginate to wound bed as instructed Secondary Dressing ABD Pad, 5x9 Discharge Instruction: Apply over primary dressing as directed. Drawtex 4x4 in Discharge Instruction: cut to into wound bed over the alginate. Secured With [employer], 4.5x3.1 (in/yd) Discharge Instruction: Secure with Kerlix as directed. 28M Medipore H Soft Cloth Surgical T ape, 4 x 10 (in/yd) Discharge Instruction: Secure with tape as directed. Compression Wrap Compression Stockings Add-Ons Electronic Signature(s) Signed: 08/12/2022 4:37:04 PM By: Kalman Shan DO Entered By: Kalman Shan on 08/12/2022 14:28:26 -------------------------------------------------------------------------------- Multi-Disciplinary Care Plan Details Patient Name: Date of Service: [name], [name] IG 08/12/2022 1:30 PM Medical Record Number: [id number] Patient Account Number: [id number] Date of Birth/Sex: Treating RN: 01-01-1961 (61 y.o. [name] Primary Care Ekaterina Denise: [name] Other Clinician: Referring Shadow Stiggers: Treating Lulani Bour/Extender: [name] in Treatment: [address] [place] ([id number]) [id number] Page 6 of 9 Active Inactive Necrotic Tissue Nursing Diagnoses: Impaired tissue integrity related to necrotic/devitalized tissue Goals: Necrotic/devitalized tissue will be minimized in the wound bed Date Initiated: 06/11/2022 Target  Resolution Date: 08/12/2022 Goal Status: Active Patient/caregiver will verbalize understanding of reason and process for debridement of necrotic tissue Date Initiated: 06/11/2022 Target Resolution Date: 08/12/2022 Goal Status: Active Interventions: Assess patient pain level pre-, during and post procedure and prior to discharge Treatment Activities: Apply topical anesthetic as ordered : 06/11/2022 Excisional debridement : 06/11/2022 Notes: Electronic Signature(s) Signed: 08/12/2022 4:44:37 PM By: [name] Hammock RN Entered By: [name] on 08/12/2022 14:18:10 -------------------------------------------------------------------------------- Pain Assessment Details Patient Name: Date of Service: [name] IG 08/12/2022 1:30 PM Medical Record Number: [id number] Patient Account Number: [id number] Date of Birth/Sex: Treating RN: 1960/11/20 (61 y.o. [name] Primary Care Annalysse Shoemaker: [name] Other Clinician: Referring Jaimarie Rapozo: Treating Trenton Passow/Extender: [name] in Treatment: 14 Active Problems Location of Pain Severity and Description of Pain Patient Has Paino Yes Site Locations Pain Location: Generalized Pain Rate the pain. Current Pain Level: 4 Character of Pain Describe the Pain: Aching Pain Management and Medication Current Pain Management: [name], [name] ([id number])  [id number] Page 7 of 9 Electronic Signature(s) Signed: 08/12/2022 4:24:03 PM By: Sabas Sous By: [name] on 08/12/2022 13:57:20 -------------------------------------------------------------------------------- Patient/Caregiver Education Details Patient Name: Date of Service: [name] 10/26/2023andnbsp1:30 PM Medical Record Number: [id number] Patient Account Number: [id number] Date of Birth/Gender: Treating RN: 11/03/1960 (61 y.o. [name] Primary Care Physician: [name] Other  Clinician: Referring Physician: Treating Physician/Extender: [name] in Treatment: 14 Education Assessment Education Provided To: Patient Education Topics Provided Wound/Skin Impairment: Methods: Explain/Verbal Responses: Reinforcements needed, State content correctly Electronic Signature(s) Signed: 08/12/2022 4:44:37 PM By: [name] Hammock RN Entered By: [name] on 08/12/2022 14:18:39 -------------------------------------------------------------------------------- Wound Assessment Details Patient Name: Date of Service: [name] IG 08/12/2022 1:30 PM Medical Record Number: [id number] Patient Account Number: [id number] Date of Birth/Sex: Treating RN: [birth year] (61 y.o. [name] Primary Care [place] Mielke: [name] Other Clinician: Referring Richmond Coldren: Treating Bertel Venard/Extender: Lucile Crater Weeks in Treatment: 14 Wound Status Wound Number: 5 Primary Diabetic Wound/Ulcer of the Lower Extremity Etiology: Wound Location: Left, Plantar Foot Wound Open Wounding Event: Gradually Appeared Status: Date Acquired: 10/19/2019 Comorbid Anemia, Sleep Apnea, Congestive Heart Failure, Hypertension, Weeks Of Treatment: 14 History: Peripheral Venous Disease, Cirrhosis , Type II Diabetes, Clustered Wound: No Osteoarthritis, Osteomyelitis, Neuropathy Photos [name], [name] ([id number]) [id number] Page 8 of 9 Wound Measurements Length: (cm) 3.4 Width: (cm) 3.2 Depth: (cm) 0.5 Area: (cm) 8.545 Volume: (cm) 4.273 % Reduction in Area: -105.7% % Reduction in Volume: -929.6% Epithelialization: None Tunneling: No Undermining: Yes Starting Position (o'clock): 9 Ending Position (o'clock): 12 Maximum Distance: (cm) 0.8 Wound Description Classification: Grade 2 Wound Margin: Thickened Exudate Amount: Medium Exudate Type: Serosanguineous Exudate Color: red, brown Foul Odor After Cleansing:  No Slough/Fibrino Yes Wound Bed Granulation Amount: Large (67-100%) Exposed Structure Granulation Quality: Red, Pink, Hyper-granulation Fascia Exposed: No Necrotic Amount: Small (1-33%) Fat Layer (Subcutaneous Tissue) Exposed: Yes Necrotic Quality: Adherent Slough Tendon Exposed: No Muscle Exposed: No Joint Exposed: No Bone Exposed: No Periwound Skin Texture Texture Color No Abnormalities Noted: No No Abnormalities Noted: No Callus: Yes Atrophie Blanche: No Crepitus: No Cyanosis: No Excoriation: No Ecchymosis: No Induration: No Erythema: No Rash: No Hemosiderin Staining: No Scarring: No Mottled: No Pallor: No Moisture Rubor: No No Abnormalities Noted: No Dry / Scaly: Yes Temperature / Pain Maceration: No Temperature: No Abnormality Treatment Notes Wound #5 (Foot) Wound Laterality: Plantar, Left Cleanser Wound Cleanser Discharge Instruction: Cleanse the wound with wound cleanser prior to applying a clean dressing using gauze sponges, not tissue or cotton balls. Peri-Wound Care Topical Primary Dressing KerraCel Ag Gelling Fiber Dressing, 2x2 in (silver alginate) Discharge Instruction: Apply silver alginate to wound bed as instructed Secondary Dressing ABD Pad, 5x9 Discharge Instruction: Apply over primary dressing as directed. Drawtex 4x4 in [name], [name] ([id number]) [id number] Page 9 of 9 Discharge Instruction: cut to into wound bed over the alginate. Secured With [employer], 4.5x3.1 (in/yd) Discharge Instruction: Secure with Kerlix as directed. 62M Medipore H Soft Cloth Surgical T ape, 4 x 10 (in/yd) Discharge Instruction: Secure with tape as directed. Compression Wrap Compression Stockings Add-Ons Electronic Signature(s) Signed: 08/12/2022 5:00:15 PM By: Deon Pilling RN, BSN Entered By: Deon Pilling on 08/12/2022 13:59:58 -------------------------------------------------------------------------------- Vitals  Details Patient Name: Date of Service: [name] IG 08/12/2022 1:30 PM Medical Record Number: [id number] Patient Account Number: [id number] Date of Birth/Sex: Treating RN: [birth year] (61 y.o. [name] Primary Care Turki Tapanes: [name] Other Clinician: Referring Deboraha Goar: Treating Sema Stangler/Extender: Lucile Crater Weeks in Treatment:  14 Vital Signs Time Taken: 13:56 Temperature (F): 98.3 Height (in): 73 Pulse (bpm): 90 Weight (lbs): 237 Respiratory Rate (breaths/min): 18 Body Mass Index (BMI): 31.3 Blood Pressure (mmHg): 148/92 Reference Range: 80 - 120 mg / dl Electronic Signature(s) Signed: 08/12/2022 4:24:03 PM By: [name] Entered By: [name] on 08/12/2022 13:56:31

## 2022-08-13 NOTE — Progress Notes (Signed)
ADIS, STURGILL ([id number]) [id number] Page 1 of 11 Visit Report for 08/04/2022 Debridement Details Patient Name: Date of Service: [name], [name] 08/04/2022 2:00 PM Medical Record Number: [id number] Patient Account Number: [id number] Date of Birth/Sex: Treating RN: 1961-09-25 (61 y.o. [name] Primary Care Provider: [name] Other Clinician: Referring Provider: Treating Provider/Extender: Avie Echevaria in Treatment: 13 Debridement Performed for Assessment: Wound #5 Left,Plantar Foot Performed By: Physician Maxwell Caul., MD Debridement Type: Debridement Severity of Tissue Pre Debridement: Fat layer exposed Level of Consciousness (Pre-procedure): Awake and Alert Pre-procedure Verification/Time Out Yes - 14:37 Taken: Start Time: 14:37 Pain Control: Lidocaine 5% topical ointment T Area Debrided (L x W): otal 3.4 (cm) x 3.5 (cm) = 11.9 (cm) Tissue and other material debrided: Non-Viable, Slough, Subcutaneous, Skin: Dermis , Skin: Epidermis, Slough Level: Skin/Subcutaneous Tissue Debridement Description: Excisional Instrument: Curette Bleeding: Moderate Hemostasis Achieved: Silver Nitrate Procedural Pain: 0 Post Procedural Pain: 0 Response to Treatment: Procedure was tolerated well Level of Consciousness (Post- Awake and Alert procedure): Post Debridement Measurements of Total Wound Length: (cm) 3.4 Width: (cm) 3.5 Depth: (cm) 0.4 Volume: (cm) 3.738 Character of Wound/Ulcer Post Debridement: Improved Severity of Tissue Post Debridement: Fat layer exposed Post Procedure Diagnosis Same as Pre-procedure Electronic Signature(s) Signed: 08/04/2022 4:28:13 PM By: Baltazar Najjar MD Signed: 08/13/2022 1:21:31 PM By: [name] Entered By: Baltazar Najjar on 08/04/2022 15:30:56 -------------------------------------------------------------------------------- HPI Details Patient Name: Date of Service: [name] 08/04/2022 2:00 PM Medical Record Number: [id number] Patient Account Number: [id number] Date of Birth/Sex: Treating RN: [birth year] (61 y.o. [name] Primary Care Provider: [name] Other Clinician: Referring Provider: Treating Provider/Extender: Avie Echevaria in Treatment: 13 History of Present Illness [place], [name] ([id number]) [id number] Page 2 of 11 HPI Description: 10/31/2019 upon evaluation today patient appears to be doing somewhat poorly in regard to his bilateral plantar feet. He has wounds that he tells me have been present since 2012 intermittently off and on. Most recently this has been open for at least the past 6 months to a year. He has been trying to treat this in different ways using Santyl along with various other dressings including Medihoney and even at one point Xeroform. Nothing really has seem to get this completely closed. He was recently in the hospital for cellulitis of his leg subsequently he did have x-rays as well as MRIs that showed negative for any signs of osteomyelitis in regard to the wounds on his feet. Fortunately there is no signs of systemic infection at this time. No fevers, chills, nausea, vomiting, or diarrhea. Patient has previously used Darco offloading shoes as far as frontal floaters as well as postop surgical shoes. He has never been in a total contact cast that may be something we need to strongly consider here. Patient's most recent hemoglobin A1c 1 month ago was 5.3 seems to be very well controlled which is great. Subsequently he has seen vascular as well as podiatry. His ABIs are 1.07 on the left and 1.14 on the right he seems to be doing well he does have chronic venous stasis. 11/07/2019 upon evaluation today patient appears to be doing well with regard to his wounds all things considered. I do not see any severe worsening he still has some callus buildup on the right more than the  left he notes that he has been probably more active than he should as far as walking is concerned is just very hard to not be active. He knows  he needs to be more careful in this regard however. He is willing to give the cast a try at this point although he notes that he is a little nervous about this just with regard to balance although he will be very careful and obviously if he has any trouble he knows to contact the office and let me know. 1/22; patient is in for his obligatory first total contact cast change. Our intake nurse reported a very large amount of drainage which is spelled out over to the surrounding skin. Has bilateral diabetic foot wounds. He has Charcot feet. We have been using silver alginate on his wounds. 11/14/2019 on evaluation today patient is actually seeming to make good improvement in regard to his bilateral plantar foot wounds. We have been using a cast on the left side and on the right side he has been using dressings he is changing up his own accord. With that being said he tells me that he is also not walking as much just due to how unsteady he feels. He takes it easy when he does have to walk and when he does not have to walk he is resting. This is probably help in his right foot as well has the left foot which is actually measuring better. In fact both are measuring better. Overall I am very pleased with how things seem to be progressing. The patient does have some odor on the left foot this does have me concerned about the possibility of infection, and actually probably go ahead and put him on antibiotics today as well as utilizing a continuation of the cast on the left foot I think that will be fine we probably just need to bring him in sooner to change this not last a whole week. 1/29; we brought the patient back today for a total contact cast change on the left out of concern for excessive drainage. We are using drawtex over the wound as the primary  dressing 11/21/2019 on evaluation today patient appears to be doing well with regard to his left plantar foot. In fact both foot ulcers actually seem to be doing pretty well. Nonetheless he is having a lot of drainage on the left at this time and again we did obtain a wound culture did show positive for Staph aureus that was reviewed by myself today as well. Nonetheless he is on Bactrim which was shown to be sensitive that should be helping in this regard. Fortunately there is no signs of infection systemically at this point. 2/5; back in clinic today for a total contact cast change apparently secondary to very significant drainage. Still using drawtex 11/28/2019 upon evaluation today patient appears to be doing well with regard to his wounds. The right foot is doing okay as measured about the same in my opinion. The left foot is actually showing signs of significant improvement is measuring smaller there is a lot of hyper granulation likely due to the continued drainage at this point. We did obtain approval for a snap VAC I think that is good to be appropriate for him and will likely help this tremendously underneath the cast. He is definitely in agreement with proceeding with such. 2/12; patient came in today after his snap VAC lost suction. Brought in to see one of our nurses. The dressing was replaced and then we put the cast back on and rehooked up the snap VAC. Apparently his wound looked very good per our intake nurse. 2/15; again we replaced the cast on  Friday. By Saturday the snap VAC and light suction. He called this morning he comes in acutely. The wounds look fine however the VAC is not functioning. We replaced the cast using silver alginate as the primary dressing backed with Kerramax. The snap VAC was not replaced 12/05/2019 upon evaluation today patient appears to be doing better in regard to his left plantar foot ulcer. Fortunately there is no signs of active infection at this time.  Unfortunately he is continuing to have issues with the right foot he is really not making any progress here things seem to be somewhat stagnant to be honest. The depth has increased but that is due to me having debrided the wound in the past based on what I am seeing. 12/12/2019 on evaluation today patient appears to be doing more poorly in regard to the left lower extremity. He has some erythema spreading up the side of his foot I am concerned about infection again at this point. Unfortunately he has been seeing improvement with a total contact cast but I do not think we should put that on today. On his right plantar foot he continues to have significant drainage this is actually measuring deeper I really do not feel like you are making any progress whatsoever. I have prescribed Granix for him unfortunately his insurance apparently was going to cost him a $500 co-pay. 12/19/2019 upon evaluation today patient actually appears to be doing better in regard to both wounds. With that being said he actually did get the reGranix which he had to pay $500 for. With that being said it does look like that he is actually made some improvement based on what I am seeing at this point with the reGranix. Obviously if he is going to continue this we are going to do something about trying to get him some help in covering the cost. 12/26/2019 on evaluation today patient appears to be doing really much better even compared to last week. Overall the wound seems to be much better even compared to last week and last week was better than the week before. Since has been using the reGranix his symptoms have improved significantly. With that being said the issue right now is simply that this is a very expensive medication for him the first dose cost him $500. Upon inspection patient's wound bed actually is however dramatically improved compared to before he started this 2 weeks ago. 01/02/2020 upon evaluation today patient appears to  actually be doing well. He still had a little bit of reGranix left that has been using in small amounts he just been applying it every other day instead of every day in order to make it go longer. Overall we are still seeing excellent improvement he is measuring smaller looking better healthier tissue and everything seems to be pointing to this headed in the right direction. Fortunately there is no evidence of infection either which is also excellent news. He does have his MRI coming up within the next week. 01/09/2020 upon evaluation today patient appears to be doing a little worse today compared to previous week's evaluation. He is actually been out of the reGranix at this point. He has been trying to make the stretch out so he has been changing the dressings on a regular set schedule like he was previous. I think this has made a difference. Fortunately there is no signs of active infection at this time. No fevers, chills, nausea, vomiting, or diarrhea. 01/16/2020 upon evaluation today patient appears to be doing  well with regard to his left plantar foot ulcer. The right plantar foot still shows some significant depth at this point. Fortunately there is no signs of active infection at this time. 01/30/2020 upon evaluation today patient appears to be doing about the same in regard to his right plantar foot ulcer there is still some depth here and we had to wait till he actually switched over to his new insurance to get approval for the MRI under his new insurance plan. With that being said he now has switched as of April 1. Fortunately there is no signs of active infection at this time. Overall in regard to his left foot ulcer this seems to be doing much better and I am actually very pleased with how things are going. With that being said it is not quite as much progress as we were seeing with the reGranix but at the same time he has had trouble getting this apparently there is been some hindrance here. I  Ernie Hew try to actually send this to [facility] that was recommended by the drug rep to me. 02/13/20 upon evaluation today patient appears to be doing better in regard to his left foot ulcer this is great news. Unfortunately the right foot ulcer is not really significantly better at this time. There is no signs of active infection systemically though he did have his MRI which showed unfortunately he does have infection noted including an abscess in the foot. There is also marrow changes noted which are consistent with osteomyelitis based on the radiology review and interpretation. Unfortunately considering that the wound is not really making the progress that we will he would like to be seen I think that this is an indication that he may need some further referral both infectious disease as well as potentially to podiatrist to see if there is anything that can be done to help with the situation that were dealing with here. The left foot again is doing great. READMISSION 06/04/2021 [name], [name] ([id number]) [id number] Page 3 of 11 This is a 61 year old man who was in the clinic in 2021 followed by Allen Derry for areas on the right and left foot. He developed a left foot infection and was referred to ID. He left the clinic in a nonhealed state and was followed for a period of [facility] Marylene Land. Apparently things really deteriorated in early July when he was admitted to hospital from 04/27/2021 through 05/06/2021 with sepsis secondary to a left foot infection. His blood cultures were negative. An MRI suggested fifth metatarsal osteomyelitis a left ankle septic joint. He was treated with vancomycin and ertapenem which he is still taking and may just about be finishing. He was seen by orthopedics and the patient adamantly refused to BKA. As far as I can tell he did not have the ankle aspirated I am not exactly sure what the issue was here. Since he has  been discharged she is at [facility] health care for rehabilitation. He was last seen by Dr. Algis Liming on 05/20/2021 he noted osteo of the tibial talar bone cuboid and fifth metatarsal which is even more extensive than what was suggested by the MRI. He is apparently going for a consultation with orthopedic surgery in [place] sometime next week. I received a call about this man 2 weeks ago from Dr. Allyson Sabal who follows him for the possibility of PAD. ABIs I think done in the office showed a ABI on the right of 1.05 at the PTA  and 0.99 at the PTA on the left. He had a DVT rule out in the left leg that was negative for the DVT. Past medical history is extensive and includes diastolic heart failure, right first metatarsal head ulcer in 2021, excision of the right second ray by Dr. Marylene Land on 03/13/2020, hypertension, hypothyroidism. Left total hip replacement, right total knee replacement, carpal tunnel syndrome, obstructive sleep apnea alcohol abuse with cirrhosis although the patient denies current alcohol intake. The patient does not think he is a diabetic however looking through [place] link I see 2 HgbAic's of this year that were greater than equal to 6.5 which by definition makes him diabetic. Nevertheless he is not on any treatment and does not check his blood sugars. The patient is now back home out of the nursing home. Saw Dr. Algis Liming last week he was taken off vancomycin and ertapenem on August 22 and now is on doxycycline on Augmentin. He also saw Dr. Weston Anna who is his orthopedic surgeon in [place] he recommended a [employer]	[place]. He has been using Medihoney. 9/6I have been having trouble getting hyperbarics approved through our prior authorization process. Even though he had a limb threatening infection in the left foot and probably the left ankle there glitches in how some of the reports are worded also some of the consultants. In any case I am going to repeat his sedimentation rate and C-reactive  protein. I am generally not in favor of doing things like this as they really do not alter the plan of care from my point of view however I am going to need to demonstrate that these remain high in order to get this through forhyperbaric treatment for chronic refractory osteomyelitis 9/13; following this man for a wound on his left plantar foot in the setting of type 2 diabetes and Charcot deformity. He has underlying chronic refractory osteomyelitis. Follow-up sedimentation rate and C-reactive protein were both elevated but the C-reactive protein was down to 1.4, sedimentation rate at 70. Sedimentation rate was only slightly down from previous at 85. His wound is measuring slightly smaller. 9/20; patient started hyperbaric oxygen therapy today and tolerated treatment well. This is for the underlying osteomyelitis. He remains on antibiotics but thinks he is getting close to finishing. The wound on the plantar aspect of his foot is the other issue we are following here. He is using Medihoney The patient has a Charcot foot in the setting of type 2 diabetes. He is going to need a total contact cast although his partner was away this week and we elected to delay this till next week 9/27; patient still tolerating hyperbaric oxygen well. Wound looked generally healthy not much depth under illumination still 100% covered in fibrinous debris. Raised callused edges around the wound he was prepared for a total contact cast. We have been using Medihoney 9/30; patient is back for his first obligatory total contact cast change. We are using Hydrofera Blue. Noted by our intake nurse to have a lot of drainage or at least a moderate amount of drainage. I am not sure I was previously aware of this 10/4; patient arrives today with a lot of drainage under the cast. When he had it changed last Friday there was also a similar amount of drainage. Her intake nurse says that they tried a wound VAC on him perhaps while I was  on vacation in August under the cast but that did not work. In my experience that has not been unusual. We have been  using Hydrofera Blue with all the secondary absorbers. The drainage today went right through all of our dressings. The patient is concerned about his foot being in a cast without much drainage. He is tolerating HBO well. There has been improvements in the wound in the mid part of his foot in the setting of a Charcot deformity 10/7; patient presents for cast change. He has no issues or complaints today. He denies signs of infection. 10/11; patient presents for cast change. At this time he would like to take a break from the cast. He would like to do daily dressing changes with Hydrofera Blue. He currently denies signs of infection. 10/14; his cast was taken off last week at his request. He arrives in the clinic with [facility] He has been changing the dressing himself. He has way too much edema in the left foot and leg to consider a total contact cast. I do not really know the issue here. He does have chronic venous insufficiency His wife stopped me in the clinic earlier in the week to report he is drinking again and she is concerned. I am uncertain whether there are other issues 10/18; he arrived in clinic last week having bilateral lower extremity edema likely secondary to chronic venous insufficiency there was too much edema in the left leg to apply a total contact cast. I put him in compression on the left leg to control the swelling. This week he cut the wrap on the foot for reasons that are unclear however today he arrives in clinic with a smaller left leg but massive edema in the left foot. He is supposed to be wearing a juxta lite stocking on the right leg but he is not wearing the contact layer. His attendance at hyperbaric oxygen has been dwindling, he did not dive yesterday and he did not dive today concerned about hyperbarics causing swelling. I looked back in his record  his last echo was in 2020 essentially normal left and right ventricular function. Last BUN and creatinine were done in July this was normal. He is having a lot of drainage in the left plantar foot wound 10/25; again he comes in today having missed HBO yesterday. Macerated skin around the wound which really does not look very good at all. A lot of edema in the left foot but an improvement in edema on the left leg. We wrapped him last week because of the amount of swelling in the left leg. We could not apply a total contact cast. The patient states that he wants to be able to change his dressing himself. I might consider this if he had stockings to control the swelling. He said he be here for HBO tomorrow. I will check the degree of erythema in his forefoot which I have marked. He is on doxycycline and Augmentin which was renewed by Dr. Algis Liming but I cannot see a follow-up note, follow-up inflammatory markers etc. I 1 point he said he was going to see his orthopedic surgeon in [place] I am not sure if he ever did this. I do not know why he has not followed up with infectious disease, he says he was not given an appointment but he is still on Augmentin doxycycline 11/1; he did not do the lab work I ordered last week. Still on Augmentin and doxycycline. Silver alginate and he is changing this daily using his own juxta lite stocking. The surface of the wound does not look too bad. No real epithelialization however. The  patient was seen today along with HBO 08/26/2021 upon evaluation today patient is being seen at his request by myself he wanted to transfer care to see me. With that being said he has been seeing Dr. Leanord Hawking since he came back in August of this year. Unfortunately he tells me he still having quite a bit of drainage. He is also significant erythema and warmth noted of the foot as well. He has been hit or miss with regard to hyperbaric oxygen therapy tells me that at this point he took this week  off because he was extremely claustrophobic and having a lot of issues he plans to start back next week. Nonetheless he has been given some medication by Dr. Leanord Hawking as well to help calm things down which again may help him. Hopefully he can get back in hyperbarics as I think this is likely necessary. Nonetheless there does seem to be evidence of cellulitis noted today as well and in general I am a little concerned in that regard. His wound unfortunately is significant on this left foot and is right where he takes the brunt of the force secondary to the Charcot arthropathy. I do not think a total contact cast is ideal due to the fact that he unfortunately is draining much too significantly he is also not happy with the idea of using a cast therefore he states he would not want to do that anyway. 09/16/2021 upon evaluation today patient's plantar foot ulcer unfortunately continues to show signs of issues here. He has been in the hospital due to trying to detox himself from alcohol he had withdrawal symptoms and subsequently was hospitalized. During that time it was recommended by both orthopedics as well as infectious disease apparently that he proceed with amputation. With that being said they discussed with him the risk of not doing so. This is well- documented in the encounter. With that being said the patient does not want to proceed down that road and tells me that he declined their advice in that regard. Subsequently our goal then is to try to do what we can to try to get this thing healed and closed. I think this means he is getting need to stay off of it is much as possible he does have a wheelchair at home that he tells me he can use I think that that is going to be something that he does need to do. 09/23/2021 upon evaluation today patient continues to have significant issues here with his foot ulcer which is plantar on the left foot. Subsequently again he does have a history of Charcot foot he  also has an issue of having had osteomyelitis as well as an open wound for some time here. Its been recommended multiple times for him to have an amputation although that is not something that he is really interested or wanting to do. 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 Subsequently this means that he also has a gait abnormality that is causing issues with his walking and [name], [name] ([id number]) [id number] Page 4 of 11 causing abnormal pressures in the central part of his foot this is the reason he has a wound. Nonetheless I want to see what I can do about trying to get a boot to help with stabilization and offloading of working to see what we can do in that regard. 09/30/2021 upon evaluation today patient appears to be doing well with regard to his plantar foot ulcer.  He still has significant granular hypergranulation but again this is much less than what it was even last time I saw him last week. Fortunately I think we are headed in the right direction. This is definitely something we could consider a skin graft or something along those lines if we can get it flattened out enough and get the drainage from being so significant. I feel like we are making some headway here. 10/07/2021 upon evaluation today patient appears to be doing decently well in regard to his wound. Fortunately there does not appear to be any signs of infection overall I feel like it is actually showing some signs of improvement. He still has some hypergranulation but this is much less than its been. 10/21/2021 upon evaluation today patient appears to be doing well with regard to his wound is actually showing some signs of improvement which is great. We have been doing the chemical cauterization with silver nitrate as well as debridement as needed and again has been using silver alginate up to this point. With that being said he does have some  Hydrofera Blue at home he wonders if that can be beneficial at this point. 11/25/2021 upon evaluation today patient appears to be doing somewhat poorly in regard to his plantar foot ulcer. He is also been having some issues with his congestive heart failure. He has recently been in the hospital. I did review those notes as well and apparently his heart failure has been somewhat out of control. He is actually being managed as an outpatient in this regard but nonetheless this is something that can still be kept a close eye on according to the notes and what I see. Fortunately I do not see any signs of active infection at this time which is great news. Nonetheless I am concerned about the boggy central portion of the wound which I think is going to likely open up in the near future. Readmission: 01-27-2022 patient presents for follow-up here in the clinic today. His last been November 25, 2021 since have seen him. At that time we just ordered an MRI fortunately the MRI did not show any obvious signs of osteomyelitis and there were some reactive changes which were questionable and could not be excluded. With that being said in the interim since have seen him back on February 8 things have gotten worse from the standpoint of how the wound appears today. I do think that he is going to require potentially more debridement to clean this wound out than what I can even offer here in the clinic there is a tremendous amount of hypergranulation tissue which is not sufficient to grow new tissue over and honestly I think this is going to lead to a delay in healing if its not taking care of. I need to see if I can get him into see a surgeon to get an opinion on whether or not they could take him to the OR for an aggressive surgical debridement to clear out this hypergranulation tissue and achieve hemostasis which is can be the biggest issue with me here in the clinic as he does tend to bleed even with very light  superficial debridements. The patient's not opposed to this and he states that if I have someone that like for him to see he would be happy to go. Otherwise his medical history really has not changed. He does tell me he is getting ready to go for an evaluation for CPAP machine. Readmission 05/04/2022 Patient was last  seen 3 months ago. He states he went to rehab for alcohol abuse. He continues to have a wound on the plantar aspect of the left foot. He states this has never completely healed over the past several years. He is following with podiatry for this issue currently and they are recommending blast X with collagen and a cam boot. He has not started this yet. He has home health. Currently he is using silver alginate. He also follows with Dr. Algis Liming infectious disease who ordered an MRI of the foot as he is concerned for osteomyelitis. CRP and sed rate are elevated. He is currently on Augmentin for prophylaxis as he has a history of osteomyelitis to this foot. Infectious disease has recommended amputation. Currently patient denies systemic signs of infection. 8/7; patient is following up with podiatry on 8/11. He wanted to come in to be debrided. He has no issues or complaints today. He has been using silver alginate to the wound bed. He has home health that helps change the dressings. He had an MRI completed on 7/18 that did not show evidence of osteomyelitis. He is scheduled to see infectious disease on 8/16. 8/25; Patient is following with podiatry for his wound care and he is currently using silver alginate and a defender boot for his Left foot wound. He would like to have debridement to the wound but is unable to see podiatry until the end of the month. He has asked if we could debride him. 9/21; patient follows with podiatry for his foot wound but likes to come into our clinic for debridements. He was last seen on 06/24/2022 by Dr. Allena Katz, podiatry. He is currently using blast X to the wound  bed. He has been using a Psychologist, forensic. He is not scheduled to see podiatry until 10/11 and comes in for debridement today. 10/9; patient presents for follow-up. He states he is no longer following with podiatry. He has been doing silver alginate to the wound bed. He states he would like to attempt a total contact cast but starting next week. He currently denies systemic signs of infection. 10/16; patient presents for follow-up. He has been using silver alginate to the wound bed. We discussed having the cast placed today and patient would like to proceed with this. He has follow-up in 2 days for his cast exchange. He currently denies signs of infection. 10/18; total contact cast placed 2 days ago. He comes back in for the obligatory total contact cast change HOWEVER our intake nurse noted copious drainage soaking right through the dressings and asked me to look at this. Indeed there was a large amount of drainage soaking right through all of the dressings that were applied. I cannot imagine that this would last 7 days. Even if we change the cast twice a week I would be concerned. We worked on this patient through the late part of 2022 in the early 2023 for this same wound. We attempted a cast at that time I think even a cast with a wound VAC and for 1 reason or another we could never make this progress towards closure. Fortunately his recent MRI did not show osteomyelitis. Electronic Signature(s) Signed: 08/04/2022 4:28:13 PM By: Baltazar Najjar MD Entered By: Baltazar Najjar on 08/04/2022 15:33:48 -------------------------------------------------------------------------------- Physical Exam Details Patient Name: Date of Service: [name] 08/04/2022 2:00 PM Medical Record Number: [id number] Patient Account Number: [id number] Date of Birth/Sex: Treating RN: 1961-02-09 (61 y.o. [name] Primary Care Provider: [name] Other Clinician: Referring  Provider: Treating  Provider/Extender: Avie Echevariaobson, Rishi Vicario Burns, Stacy Weeks in Treatment: [address] [name], [name] InnRAIG ([id number]) [id number] Page 5 of 11 Constitutional Sitting or standing Blood Pressure is within target range for patient.. Pulse regular and within target range for patient.Marland Kitchen. Respirations regular, non-labored and within target range.. Temperature is normal and within the target range for the patient.Marland Kitchen. Appears in no distress. Notes Wound exam; plantar left foot mid aspect protruding soft tissue with rolled senescent edges around the wound edge. There is no erythema and no evidence of infection. The tissue in the wound surface does not seem adherent to the wound circumference. I used a #5 curette to aggressively debride the surface of this wound to knock down the excessive tissue also some of the rolled edges around the wound edge. We had a lot of trouble getting hemostasis which was refractory to silver nitrate and pressure, Surgifoam. We finally used topical epinephrine Electronic Signature(s) Signed: 08/04/2022 4:28:13 PM By: Baltazar Najjarobson, Eldon Zietlow MD Entered By: Baltazar Najjarobson, Sailor Hevia on 08/04/2022 15:36:56 -------------------------------------------------------------------------------- Physician Orders Details Patient Name: Date of Service: [name] OxfordMILLER, [name] 08/04/2022 2:00 PM Medical Record Number: [id number] Patient Account Number: [id number] Date of Birth/Sex: Treating RN: 09/13/1961 ([age] y.o. Cline CoolsM) Palmer, Carrie Primary Care Provider: Cheryll CockayneBurns, Stacy Other Clinician: Referring Provider: Treating Provider/Extender: Avie Echevariaobson, Nethan Caudillo Burns, Stacy Weeks in Treatment: 13 Verbal / Phone Orders: No Diagnosis Coding Follow-up Appointments ppointment in 1 week. - Dr. Mikey BussingHoffman Return A Anesthetic (In clinic) Topical Lidocaine 5% applied to wound bed (In clinic) Topical Lidocaine 4% applied to wound bed Cellular or Tissue Based Products Cellular or Tissue Based Product Type: - Run IVR for Grafix=20%  [place] Bathing/ Shower/ Hygiene May shower with protection but do not get wound dressing(s) wet. - May use cast protector bag from [facility], [employer] or [facility] Edema Control - Lymphedema / SCD / Other Elevate legs to the level of the heart or above for 30 minutes daily and/or when sitting, a frequency of: - throughout the day Avoid standing for long periods of time. Exercise regularly Moisturize legs daily. Compression stocking or Garment 30-40 mm/Hg pressure to: - wear the Juxtalite HD to right and left leg. apply in the morning and remove at night. Off-Loading Other: - Defender Boot: minimal weight bearing left foot use wheelchair for mobility. Additional Orders / Instructions Follow Nutritious Diet Wound Treatment Wound #5 - Foot Wound Laterality: Plantar, Left Cleanser: Wound Cleanser 1 x Per Week/30 Days Discharge Instructions: Cleanse the wound with wound cleanser prior to applying a clean dressing using gauze sponges, not tissue or cotton balls. Prim Dressing: KerraCel Ag Gelling Fiber Dressing, 2x2 in (silver alginate) 1 x Per Week/30 Days ary Discharge Instructions: Apply silver alginate to wound bed as instructed Secondary Dressing: ABD Pad, 5x9 (Home Health) 1 x Per Week/30 Days Discharge Instructions: Apply over primary dressing as directed. Secondary Dressing: Drawtex 4x4 in 1 x Per Week/30 Days Discharge Instructions: cut to into wound bed over the alginate. Secured With: [employer] Group[employer]erlix Roll Sterile, 4.5x3.1 (in/yd) 1 x Per Week/30 Days Discharge Instructions: Secure with Kerlix as directed. Fanny BienMILLER, Raygen ([id number]) [id number] Page 6 of 11 Secured With: 66M Medipore H Soft Cloth Surgical T ape, 4 x 10 (in/yd) 1 x Per Week/30 Days Discharge Instructions: Secure with tape as directed. Electronic Signature(s) Signed: 08/04/2022 4:28:13 PM By: Baltazar Najjarobson, Kyren Knick MD Signed: 08/04/2022 5:13:40 PM By: Redmond PullingPalmer, Carrie RN, BSN Entered By: Redmond PullingPalmer, Carrie on 08/04/2022  14:46:06 -------------------------------------------------------------------------------- Problem List Details Patient Name: Date of Service: [name] OxfordMILLER, [name] 08/04/2022 2:00 PM Medical Record  Number: [id number] Patient Account Number: [id number] Date of Birth/Sex: Treating RN: December 21, 1960 (61 y.o. [name] Primary Care Provider: [name] Other Clinician: Referring Provider: Treating Provider/Extender: Avie Echevaria in Treatment: 13 Active Problems ICD-10 Encounter Code Description Active Date MDM Diagnosis E11.621 Type 2 diabetes mellitus with foot ulcer 05/04/2022 No Yes L97.522 Non-pressure chronic ulcer of other part of left foot with fat layer exposed 05/04/2022 No Yes M86.372 Chronic multifocal osteomyelitis, left ankle and foot 05/04/2022 No Yes M14.672 Charcot's joint, left ankle and foot 05/04/2022 No Yes Inactive Problems Resolved Problems Electronic Signature(s) Signed: 08/04/2022 4:28:13 PM By: Baltazar Najjar MD Entered By: Baltazar Najjar on 08/04/2022 15:30:38 -------------------------------------------------------------------------------- Progress Note Details Patient Name: Date of Service: [name] 08/04/2022 2:00 PM Medical Record Number: [id number] Patient Account Number: [id number] Date of Birth/Sex: Treating RN: 10-12-1961 (61 y.o. [name] Primary Care Provider: [name] Other Clinician: Referring Provider: Treating Provider/Extender: Avie Echevaria in Treatment: [address], [place] ([id number]) [id number] Page 7 of 11 Subjective History of Present Illness (HPI) 10/31/2019 upon evaluation today patient appears to be doing somewhat poorly in regard to his bilateral plantar feet. He has wounds that he tells me have been present since 2012 intermittently off and on. Most recently this has been open for at least the past 6 months to a year. He has been trying to treat  this in different ways using Santyl along with various other dressings including Medihoney and even at one point Xeroform. Nothing really has seem to get this completely closed. He was recently in the hospital for cellulitis of his leg subsequently he did have x-rays as well as MRIs that showed negative for any signs of osteomyelitis in regard to the wounds on his feet. Fortunately there is no signs of systemic infection at this time. No fevers, chills, nausea, vomiting, or diarrhea. Patient has previously used Darco offloading shoes as far as frontal floaters as well as postop surgical shoes. He has never been in a total contact cast that may be something we need to strongly consider here. Patient's most recent hemoglobin A1c 1 month ago was 5.3 seems to be very well controlled which is great. Subsequently he has seen vascular as well as podiatry. His ABIs are 1.07 on the left and 1.14 on the right he seems to be doing well he does have chronic venous stasis. 11/07/2019 upon evaluation today patient appears to be doing well with regard to his wounds all things considered. I do not see any severe worsening he still has some callus buildup on the right more than the left he notes that he has been probably more active than he should as far as walking is concerned is just very hard to not be active. He knows he needs to be more careful in this regard however. He is willing to give the cast a try at this point although he notes that he is a little nervous about this just with regard to balance although he will be very careful and obviously if he has any trouble he knows to contact the office and let me know. 1/22; patient is in for his obligatory first total contact cast change. Our intake nurse reported a very large amount of drainage which is spelled out over to the surrounding skin. Has bilateral diabetic foot wounds. He has Charcot feet. We have been using silver alginate on his wounds. 11/14/2019 on  evaluation today patient is actually seeming to make good improvement  in regard to his bilateral plantar foot wounds. We have been using a cast on the left side and on the right side he has been using dressings he is changing up his own accord. With that being said he tells me that he is also not walking as much just due to how unsteady he feels. He takes it easy when he does have to walk and when he does not have to walk he is resting. This is probably help in his right foot as well has the left foot which is actually measuring better. In fact both are measuring better. Overall I am very pleased with how things seem to be progressing. The patient does have some odor on the left foot this does have me concerned about the possibility of infection, and actually probably go ahead and put him on antibiotics today as well as utilizing a continuation of the cast on the left foot I think that will be fine we probably just need to bring him in sooner to change this not last a whole week. 1/29; we brought the patient back today for a total contact cast change on the left out of concern for excessive drainage. We are using drawtex over the wound as the primary dressing 11/21/2019 on evaluation today patient appears to be doing well with regard to his left plantar foot. In fact both foot ulcers actually seem to be doing pretty well. Nonetheless he is having a lot of drainage on the left at this time and again we did obtain a wound culture did show positive for Staph aureus that was reviewed by myself today as well. Nonetheless he is on Bactrim which was shown to be sensitive that should be helping in this regard. Fortunately there is no signs of infection systemically at this point. 2/5; back in clinic today for a total contact cast change apparently secondary to very significant drainage. Still using drawtex 11/28/2019 upon evaluation today patient appears to be doing well with regard to his wounds. The right foot  is doing okay as measured about the same in my opinion. The left foot is actually showing signs of significant improvement is measuring smaller there is a lot of hyper granulation likely due to the continued drainage at this point. We did obtain approval for a snap VAC I think that is good to be appropriate for him and will likely help this tremendously underneath the cast. He is definitely in agreement with proceeding with such. 2/12; patient came in today after his snap VAC lost suction. Brought in to see one of our nurses. The dressing was replaced and then we put the cast back on and rehooked up the snap VAC. Apparently his wound looked very good per our intake nurse. 2/15; again we replaced the cast on Friday. By Saturday the snap VAC and light suction. He called this morning he comes in acutely. The wounds look fine however the VAC is not functioning. We replaced the cast using silver alginate as the primary dressing backed with Kerramax. The snap VAC was not replaced 12/05/2019 upon evaluation today patient appears to be doing better in regard to his left plantar foot ulcer. Fortunately there is no signs of active infection at this time. Unfortunately he is continuing to have issues with the right foot he is really not making any progress here things seem to be somewhat stagnant to be honest. The depth has increased but that is due to me having debrided the wound in the past based  on what I am seeing. 12/12/2019 on evaluation today patient appears to be doing more poorly in regard to the left lower extremity. He has some erythema spreading up the side of his foot I am concerned about infection again at this point. Unfortunately he has been seeing improvement with a total contact cast but I do not think we should put that on today. On his right plantar foot he continues to have significant drainage this is actually measuring deeper I really do not feel like you are making any progress whatsoever. I  have prescribed Granix for him unfortunately his insurance apparently was going to cost him a $500 co-pay. 12/19/2019 upon evaluation today patient actually appears to be doing better in regard to both wounds. With that being said he actually did get the reGranix which he had to pay $500 for. With that being said it does look like that he is actually made some improvement based on what I am seeing at this point with the reGranix. Obviously if he is going to continue this we are going to do something about trying to get him some help in covering the cost. 12/26/2019 on evaluation today patient appears to be doing really much better even compared to last week. Overall the wound seems to be much better even compared to last week and last week was better than the week before. Since has been using the reGranix his symptoms have improved significantly. With that being said the issue right now is simply that this is a very expensive medication for him the first dose cost him $500. Upon inspection patient's wound bed actually is however dramatically improved compared to before he started this 2 weeks ago. 01/02/2020 upon evaluation today patient appears to actually be doing well. He still had a little bit of reGranix left that has been using in small amounts he just been applying it every other day instead of every day in order to make it go longer. Overall we are still seeing excellent improvement he is measuring smaller looking better healthier tissue and everything seems to be pointing to this headed in the right direction. Fortunately there is no evidence of infection either which is also excellent news. He does have his MRI coming up within the next week. 01/09/2020 upon evaluation today patient appears to be doing a little worse today compared to previous week's evaluation. He is actually been out of the reGranix at this point. He has been trying to make the stretch out so he has been changing the dressings on  a regular set schedule like he was previous. I think this has made a difference. Fortunately there is no signs of active infection at this time. No fevers, chills, nausea, vomiting, or diarrhea. 01/16/2020 upon evaluation today patient appears to be doing well with regard to his left plantar foot ulcer. The right plantar foot still shows some significant depth at this point. Fortunately there is no signs of active infection at this time. 01/30/2020 upon evaluation today patient appears to be doing about the same in regard to his right plantar foot ulcer there is still some depth here and we had to wait till he actually switched over to his new insurance to get approval for the MRI under his new insurance plan. With that being said he now has switched as of April 1. Fortunately there is no signs of active infection at this time. Overall in regard to his left foot ulcer this seems to be doing much better and  I am actually very pleased with how things are going. With that being said it is not quite as much progress as we were seeing with the reGranix but at the same time he has had trouble getting this apparently there is been some hindrance here. I Ernie Hew try to actually send this to [facility] that was recommended by the drug rep to me. 02/13/20 upon evaluation today patient appears to be doing better in regard to his left foot ulcer this is great news. Unfortunately the right foot ulcer is not really significantly better at this time. There is no signs of active infection systemically though he did have his MRI which showed unfortunately he does have infection noted including an abscess in the foot. There is also marrow changes noted which are consistent with osteomyelitis based on the radiology review and interpretation. Unfortunately considering that the wound is not really making the progress that we will he would like to be seen I think that this is an indication that he may need some further  referral both infectious disease as well as potentially to podiatrist to see if there is anything that can be done to help with the situation that were dealing with here. The left foot again is doing great. [name], [name] ([id number]) [id number] Page 8 of 11 READMISSION 06/04/2021 This is a 61 year old man who was in the clinic in 2021 followed by Allen Derry for areas on the right and left foot. He developed a left foot infection and was referred to ID. He left the clinic in a nonhealed state and was followed for a period of time [facility]. Marylene Land. Apparently things really deteriorated in early July when he was admitted to hospital from 04/27/2021 through 05/06/2021 with sepsis secondary to a left foot infection. His blood cultures were negative. An MRI suggested fifth metatarsal osteomyelitis a left ankle septic joint. He was treated with vancomycin and ertapenem which he is still taking and may just about be finishing. He was seen by orthopedics and the patient adamantly refused to BKA. As far as I can tell he did not have the ankle aspirated I am not exactly sure what the issue was here. Since he has been discharged she is at [facility] for rehabilitation. He was last seen by Dr. Algis Liming on 05/20/2021 he noted osteo of the tibial talar bone cuboid and fifth metatarsal which is even more extensive than what was suggested by the MRI. He is apparently going for a consultation with orthopedic surgery in [place] sometime next week. I received a call about this man 2 weeks ago from Dr. Allyson Sabal who follows him for the possibility of PAD. ABIs I think done in the office showed a ABI on the right of 1.05 at the PTA and 0.99 at the PTA on the left. He had a DVT rule out in the left leg that was negative for the DVT. Past medical history is extensive and includes diastolic heart failure, right first metatarsal head ulcer in 2021, excision of the right second  ray by Dr. Marylene Land on 03/13/2020, hypertension, hypothyroidism. Left total hip replacement, right total knee replacement, carpal tunnel syndrome, obstructive sleep apnea alcohol abuse with cirrhosis although the patient denies current alcohol intake. The patient does not think he is a diabetic however looking through [place] link I see 2 HgbAic's of this year that were greater than equal to 6.5 which by definition makes him diabetic. Nevertheless he is not on any treatment and  does not check his blood sugars. The patient is now back home out of the nursing home. Saw Dr. Algis Liming last week he was taken off vancomycin and ertapenem on August 22 and now is on doxycycline on Augmentin. He also saw Dr. Weston Anna who is his orthopedic surgeon in [place] he recommended a [employer]	[place]. He has been using Medihoney. 9/6I have been having trouble getting hyperbarics approved through our prior authorization process. Even though he had a limb threatening infection in the left foot and probably the left ankle there glitches in how some of the reports are worded also some of the consultants. In any case I am going to repeat his sedimentation rate and C-reactive protein. I am generally not in favor of doing things like this as they really do not alter the plan of care from my point of view however I am going to need to demonstrate that these remain high in order to get this through forhyperbaric treatment for chronic refractory osteomyelitis 9/13; following this man for a wound on his left plantar foot in the setting of type 2 diabetes and Charcot deformity. He has underlying chronic refractory osteomyelitis. Follow-up sedimentation rate and C-reactive protein were both elevated but the C-reactive protein was down to 1.4, sedimentation rate at 70. Sedimentation rate was only slightly down from previous at 85. His wound is measuring slightly smaller. 9/20; patient started hyperbaric oxygen therapy today and tolerated  treatment well. This is for the underlying osteomyelitis. He remains on antibiotics but thinks he is getting close to finishing. The wound on the plantar aspect of his foot is the other issue we are following here. He is using Medihoney The patient has a Charcot foot in the setting of type 2 diabetes. He is going to need a total contact cast although his partner was away this week and we elected to delay this till next week 9/27; patient still tolerating hyperbaric oxygen well. Wound looked generally healthy not much depth under illumination still 100% covered in fibrinous debris. Raised callused edges around the wound he was prepared for a total contact cast. We have been using Medihoney 9/30; patient is back for his first obligatory total contact cast change. We are using Hydrofera Blue. Noted by our intake nurse to have a lot of drainage or at least a moderate amount of drainage. I am not sure I was previously aware of this 10/4; patient arrives today with a lot of drainage under the cast. When he had it changed last Friday there was also a similar amount of drainage. Her intake nurse says that they tried a wound VAC on him perhaps while I was on vacation in August under the cast but that did not work. In my experience that has not been unusual. We have been using Hydrofera Blue with all the secondary absorbers. The drainage today went right through all of our dressings. The patient is concerned about his foot being in a cast without much drainage. He is tolerating HBO well. There has been improvements in the wound in the mid part of his foot in the setting of a Charcot deformity 10/7; patient presents for cast change. He has no issues or complaints today. He denies signs of infection. 10/11; patient presents for cast change. At this time he would like to take a break from the cast. He would like to do daily dressing changes with Hydrofera Blue. He currently denies signs of infection. 10/14; his  cast was taken off last week at his  request. He arrives in the clinic with [facility]. He has been changing the dressing himself. He has way too much edema in the left foot and leg to consider a total contact cast. I do not really know the issue here. He does have chronic venous insufficiency His wife stopped me in the clinic earlier in the week to report he is drinking again and she is concerned. I am uncertain whether there are other issues 10/18; he arrived in clinic last week having bilateral lower extremity edema likely secondary to chronic venous insufficiency there was too much edema in the left leg to apply a total contact cast. I put him in compression on the left leg to control the swelling. This week he cut the wrap on the foot for reasons that are unclear however today he arrives in clinic with a smaller left leg but massive edema in the left foot. He is supposed to be wearing a juxta lite stocking on the right leg but he is not wearing the contact layer. His attendance at hyperbaric oxygen has been dwindling, he did not dive yesterday and he did not dive today concerned about hyperbarics causing swelling. I looked back in his record his last echo was in 2020 essentially normal left and right ventricular function. Last BUN and creatinine were done in July this was normal. He is having a lot of drainage in the left plantar foot wound 10/25; again he comes in today having missed HBO yesterday. Macerated skin around the wound which really does not look very good at all. A lot of edema in the left foot but an improvement in edema on the left leg. We wrapped him last week because of the amount of swelling in the left leg. We could not apply a total contact cast. The patient states that he wants to be able to change his dressing himself. I might consider this if he had stockings to control the swelling. He said he be here for HBO tomorrow. I will check the degree of erythema in his forefoot  which I have marked. He is on doxycycline and Augmentin which was renewed by Dr. Algis Liming but I cannot see a follow-up note, follow-up inflammatory markers etc. I 1 point he said he was going to see his orthopedic surgeon in [place] I am not sure if he ever did this. I do not know why he has not followed up with infectious disease, he says he was not given an appointment but he is still on Augmentin doxycycline 11/1; he did not do the lab work I ordered last week. Still on Augmentin and doxycycline. Silver alginate and he is changing this daily using his own juxta lite stocking. The surface of the wound does not look too bad. No real epithelialization however. The patient was seen today along with HBO 08/26/2021 upon evaluation today patient is being seen at his request by myself he wanted to transfer care to see me. With that being said he has been seeing Dr. Leanord Hawking since he came back in August of this year. Unfortunately he tells me he still having quite a bit of drainage. He is also significant erythema and warmth noted of the foot as well. He has been hit or miss with regard to hyperbaric oxygen therapy tells me that at this point he took this week off because he was extremely claustrophobic and having a lot of issues he plans to start back next week. Nonetheless he has been given some medication by Dr.  Leanord Hawking as well to help calm things down which again may help him. Hopefully he can get back in hyperbarics as I think this is likely necessary. Nonetheless there does seem to be evidence of cellulitis noted today as well and in general I am a little concerned in that regard. His wound unfortunately is significant on this left foot and is right where he takes the brunt of the force secondary to the Charcot arthropathy. I do not think a total contact cast is ideal due to the fact that he unfortunately is draining much too significantly he is also not happy with the idea of using a cast therefore he  states he would not want to do that anyway. 09/16/2021 upon evaluation today patient's plantar foot ulcer unfortunately continues to show signs of issues here. He has been in the hospital due to trying to detox himself from alcohol he had withdrawal symptoms and subsequently was hospitalized. During that time it was recommended by both orthopedics as well as infectious disease apparently that he proceed with amputation. With that being said they discussed with him the risk of not doing so. This is well- documented in the encounter. With that being said the patient does not want to proceed down that road and tells me that he declined their advice in that regard. Subsequently our goal then is to try to do what we can to try to get this thing healed and closed. I think this means he is getting need to stay off of it is much as possible he does have a wheelchair at home that he tells me he can use I think that that is going to be something that he does need to do. 09/23/2021 upon evaluation today patient continues to have significant issues here with his foot ulcer which is plantar on the left foot. Subsequently again he [name], [name] ([id number]) [id number] Page 9 of 11 does have a history of Charcot foot he also has an issue of having had osteomyelitis as well as an open wound for some time here. Its been recommended multiple times for him to have an amputation although that is not something that he is really interested or wanting to do. 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 Subsequently this means that he also has a gait abnormality that is causing issues with his walking and causing abnormal pressures in the central part of his foot this is the reason he has a wound. Nonetheless I want to see what I can do about trying to get a boot to help with stabilization and offloading of working to see what we can do in that  regard. 09/30/2021 upon evaluation today patient appears to be doing well with regard to his plantar foot ulcer. He still has significant granular hypergranulation but again this is much less than what it was even last time I saw him last week. Fortunately I think we are headed in the right direction. This is definitely something we could consider a skin graft or something along those lines if we can get it flattened out enough and get the drainage from being so significant. I feel like we are making some headway here. 10/07/2021 upon evaluation today patient appears to be doing decently well in regard to his wound. Fortunately there does not appear to be any signs of infection overall I feel like it is actually showing some signs of improvement. He still has some hypergranulation but this is much  less than its been. 10/21/2021 upon evaluation today patient appears to be doing well with regard to his wound is actually showing some signs of improvement which is great. We have been doing the chemical cauterization with silver nitrate as well as debridement as needed and again has been using silver alginate up to this point. With that being said he does have some Hydrofera Blue at home he wonders if that can be beneficial at this point. 11/25/2021 upon evaluation today patient appears to be doing somewhat poorly in regard to his plantar foot ulcer. He is also been having some issues with his congestive heart failure. He has recently been in the hospital. I did review those notes as well and apparently his heart failure has been somewhat out of control. He is actually being managed as an outpatient in this regard but nonetheless this is something that can still be kept a close eye on according to the notes and what I see. Fortunately I do not see any signs of active infection at this time which is great news. Nonetheless I am concerned about the boggy central portion of the wound which I think is going to  likely open up in the near future. Readmission: 01-27-2022 patient presents for follow-up here in the clinic today. His last been November 25, 2021 since have seen him. At that time we just ordered an MRI fortunately the MRI did not show any obvious signs of osteomyelitis and there were some reactive changes which were questionable and could not be excluded. With that being said in the interim since have seen him back on February 8 things have gotten worse from the standpoint of how the wound appears today. I do think that he is going to require potentially more debridement to clean this wound out than what I can even offer here in the clinic there is a tremendous amount of hypergranulation tissue which is not sufficient to grow new tissue over and honestly I think this is going to lead to a delay in healing if its not taking care of. I need to see if I can get him into see a surgeon to get an opinion on whether or not they could take him to the OR for an aggressive surgical debridement to clear out this hypergranulation tissue and achieve hemostasis which is can be the biggest issue with me here in the clinic as he does tend to bleed even with very light superficial debridements. The patient's not opposed to this and he states that if I have someone that like for him to see he would be happy to go. Otherwise his medical history really has not changed. He does tell me he is getting ready to go for an evaluation for CPAP machine. Readmission 05/04/2022 Patient was last seen 3 months ago. He states he went to rehab for alcohol abuse. He continues to have a wound on the plantar aspect of the left foot. He states this has never completely healed over the past several years. He is following with podiatry for this issue currently and they are recommending blast X with collagen and a cam boot. He has not started this yet. He has home health. Currently he is using silver alginate. He also follows with Dr.  Algis Liming infectious disease who ordered an MRI of the foot as he is concerned for osteomyelitis. CRP and sed rate are elevated. He is currently on Augmentin for prophylaxis as he has a history of osteomyelitis to this foot. Infectious disease  has recommended amputation. Currently patient denies systemic signs of infection. 8/7; patient is following up with podiatry on 8/11. He wanted to come in to be debrided. He has no issues or complaints today. He has been using silver alginate to the wound bed. He has home health that helps change the dressings. He had an MRI completed on 7/18 that did not show evidence of osteomyelitis. He is scheduled to see infectious disease on 8/16. 8/25; Patient is following with podiatry for his wound care and he is currently using silver alginate and a defender boot for his Left foot wound. He would like to have debridement to the wound but is unable to see podiatry until the end of the month. He has asked if we could debride him. 9/21; patient follows with podiatry for his foot wound but likes to come into our clinic for debridements. He was last seen on 06/24/2022 by Dr. Posey Pronto, podiatry. He is currently using blast X to the wound bed. He has been using a Conservation officer, nature. He is not scheduled to see podiatry until 10/11 and comes in for debridement today. 10/9; patient presents for follow-up. He states he is no longer following with podiatry. He has been doing silver alginate to the wound bed. He states he would like to attempt a total contact cast but starting next week. He currently denies systemic signs of infection. 10/16; patient presents for follow-up. He has been using silver alginate to the wound bed. We discussed having the cast placed today and patient would like to proceed with this. He has follow-up in 2 days for his cast exchange. He currently denies signs of infection. 10/18; total contact cast placed 2 days ago. He comes back in for the obligatory total contact  cast change HOWEVER our intake nurse noted copious drainage soaking right through the dressings and asked me to look at this. Indeed there was a large amount of drainage soaking right through all of the dressings that were applied. I cannot imagine that this would last 7 days. Even if we change the cast twice a week I would be concerned. We worked on this patient through the late part of 2022 in the early 2023 for this same wound. We attempted a cast at that time I think even a cast with a wound VAC and for 1 reason or another we could never make this progress towards closure. Fortunately his recent MRI did not show osteomyelitis. Objective Constitutional Sitting or standing Blood Pressure is within target range for patient.. Pulse regular and within target range for patient.Marland Kitchen Respirations regular, non-labored and within target range.. Temperature is normal and within the target range for the patient.Marland Kitchen Appears in no distress. Vitals Time Taken: 2:17 PM, Height: 73 in, Weight: 237 lbs, BMI: 31.3, Temperature: 97.6 F, Pulse: 92 bpm, Respiratory Rate: 18 breaths/min, Blood Pressure: 130/79 mmHg. General Notes: Wound exam; plantar left foot mid aspect protruding soft tissue with rolled senescent edges around the wound edge. There is no erythema and no evidence of infection. The tissue in the wound surface does not seem adherent to the wound circumference. I used a #5 curette to aggressively debride the surface of this wound to knock down the excessive tissue also some of the rolled edges around the wound edge. We had a lot of trouble getting hemostasis which was refractory to silver nitrate and pressure, Surgifoam. We finally used topical epinephrine [name], [name] ([id number]) [phone].pdf Page 10 of 11 Integumentary (Hair, Skin) Wound #5 status is Open. Original  cause of wound was Gradually Appeared. The date acquired was: 10/19/2019. The wound has been in treatment 13 weeks.  The wound is located on the Left,Plantar Foot. The wound measures 3.4cm length x 3.5cm width x 0.4cm depth; 9.346cm^2 area and 3.738cm^3 volume. There is Fat Layer (Subcutaneous Tissue) exposed. There is a medium amount of serosanguineous drainage noted. The wound margin is thickened. There is large (67- 100%) red, pink, hyper - granulation within the wound bed. There is a small (1-33%) amount of necrotic tissue within the wound bed including Adherent Slough. The periwound skin appearance had no abnormalities noted for moisture. The periwound skin appearance exhibited: Callus. The periwound skin appearance did not exhibit: Crepitus, Excoriation, Induration, Rash, Scarring, Atrophie Blanche, Cyanosis, Ecchymosis, Hemosiderin Staining, Mottled, Pallor, Rubor, Erythema. Periwound temperature was noted as No Abnormality. Assessment Active Problems ICD-10 Type 2 diabetes mellitus with foot ulcer Non-pressure chronic ulcer of other part of left foot with fat layer exposed Chronic multifocal osteomyelitis, left ankle and foot Charcot's joint, left ankle and foot Procedures Wound #5 Pre-procedure diagnosis of Wound #5 is a Diabetic Wound/Ulcer of the Lower Extremity located on the Left,Plantar Foot .Severity of Tissue Pre Debridement is: Fat layer exposed. There was a Excisional Skin/Subcutaneous Tissue Debridement with a total area of 11.9 sq cm performed by Maxwell Caul., MD. With the following instrument(s): Curette to remove Non-Viable tissue/material. Material removed includes Subcutaneous Tissue, Slough, Skin: Dermis, and Skin: Epidermis after achieving pain control using Lidocaine 5% topical ointment. No specimens were taken. A time out was conducted at 14:37, prior to the start of the procedure. A Moderate amount of bleeding was controlled with Silver Nitrate. The procedure was tolerated well with a pain level of 0 throughout and a pain level of 0 following the procedure. Post Debridement  Measurements: 3.4cm length x 3.5cm width x 0.4cm depth; 3.738cm^3 volume. Character of Wound/Ulcer Post Debridement is improved. Severity of Tissue Post Debridement is: Fat layer exposed. Post procedure Diagnosis Wound #5: Same as Pre-Procedure Plan Follow-up Appointments: Return Appointment in 1 week. - Dr. Mikey Bussing Anesthetic: (In clinic) Topical Lidocaine 5% applied to wound bed (In clinic) Topical Lidocaine 4% applied to wound bed Cellular or Tissue Based Products: Cellular or Tissue Based Product Type: - Run IVR for Grafix=20% Copay Bathing/ Shower/ Hygiene: May shower with protection but do not get wound dressing(s) wet. - May use cast protector bag from [facility], [employer] or [facility] Edema Control - Lymphedema / SCD / Other: Elevate legs to the level of the heart or above for 30 minutes daily and/or when sitting, a frequency of: - throughout the day Avoid standing for long periods of time. Exercise regularly Moisturize legs daily. Compression stocking or Garment 30-40 mm/Hg pressure to: - wear the Juxtalite HD to right and left leg. apply in the morning and remove at night. Off-Loading: Other: - Defender Boot: minimal weight bearing left foot use wheelchair for mobility. Additional Orders / Instructions: Follow Nutritious Diet WOUND #5: - Foot Wound Laterality: Plantar, Left Cleanser: Wound Cleanser 1 x Per Week/30 Days Discharge Instructions: Cleanse the wound with wound cleanser prior to applying a clean dressing using gauze sponges, not tissue or cotton balls. Prim Dressing: KerraCel Ag Gelling Fiber Dressing, 2x2 in (silver alginate) 1 x Per Week/30 Days ary Discharge Instructions: Apply silver alginate to wound bed as instructed Secondary Dressing: ABD Pad, 5x9 (Home Health) 1 x Per Week/30 Days Discharge Instructions: Apply over primary dressing as directed. Secondary Dressing: Drawtex 4x4 in 1 x Per  Week/30 Days Discharge Instructions: cut to into wound bed over the  alginate. Secured With: [employer], 4.5x3.1 (in/yd) 1 x Per Week/30 Days Discharge Instructions: Secure with Kerlix as directed. Secured With: 80M Medipore H Soft Cloth Surgical T ape, 4 x 10 (in/yd) 1 x Per Week/30 Days Discharge Instructions: Secure with tape as directed. 1. He is having far too much drainage to replace the total contact cast 2. The tissue in the base of this wound did not look viable nor the edges aggressive debridement today 3. I remember working on this wound last year. We even attempted a wound VAC under a total contact cast but for 1 reason or another that did not work. 4. Infection does not seem to be the major issue right now. 5. We went back to the silver alginate based dressings under the defender boot simply too much drainage for the total contact cast at least for now [name], [name] ([id number]) [id number] Page 11 of 11 6. I would consider a deep tissue culture and possibly a biopsy of this soft tissue which seems odd in consistency and appearance. Although the patient was fairly insistent he had had a previous biopsy looking back through [facility] records showed that this was bone on the right foot Electronic Signature(s) Signed: 08/04/2022 4:28:13 PM By: Baltazar Najjar MD Entered By: Baltazar Najjar on 08/04/2022 15:39:11 -------------------------------------------------------------------------------- SuperBill Details Patient Name: Date of Service: [name] 08/04/2022 Medical Record Number: [id number] Patient Account Number: [id number] Date of Birth/Sex: Treating RN: 1960-12-03 (61 y.o. [name] Primary Care Provider: [name] Other Clinician: Referring Provider: Treating Provider/Extender: Avie Echevaria in Treatment: 13 Diagnosis Coding ICD-10 Codes Code Description E11.621 Type 2 diabetes mellitus with foot ulcer L97.522 Non-pressure chronic ulcer of other part of left foot with fat  layer exposed M86.372 Chronic multifocal osteomyelitis, left ankle and foot M14.672 Charcot's joint, left ankle and foot Facility Procedures : CPT4 Code: [id number] Description: 11042 - DEB SUBQ TISSUE 20 SQ CM/< ICD-10 Diagnosis Description E11.621 Type 2 diabetes mellitus with foot ulcer L97.522 Non-pressure chronic ulcer of other part of left foot with fat layer exposed Modifier: Quantity: 1 Physician Procedures : CPT4 Code Description Modifier [id number] 11042 - WC PHYS SUBQ TISS 20 SQ CM ICD-10 Diagnosis Description E11.621 Type 2 diabetes mellitus with foot ulcer L97.522 Non-pressure chronic ulcer of other part of left foot with fat layer exposed Quantity: 1 Electronic Signature(s) Signed: 08/04/2022 4:28:13 PM By: Baltazar Najjar MD Entered By: Baltazar Najjar on 08/04/2022 15:39:26

## 2022-08-13 NOTE — Progress Notes (Signed)
[name], KEEP ([id number]) [id number] Page 1 of 7 Visit Report for 08/04/2022 Arrival Information Details Patient Name: Date of Service: [name], [name] 08/04/2022 2:00 PM Medical Record Number: [id number] Patient Account Number: [id number] Date of Birth/Sex: Treating RN: 05-23-1961 (61 y.o. Mare Ferrari Primary Care Matvey Llanas: Billey Gosling Other Clinician: Referring Shayon Trompeter: Treating Marguarite Markov/Extender: Genella Rife in Treatment: 13 Visit Information History Since Last Visit Added or deleted any medications: No Patient Arrived: [name] Any new allergies or adverse reactions: No Arrival Time: 14:14 Had a fall or experienced change in No Accompanied By: self activities of daily living that may affect Transfer Assistance: None risk of falls: Patient Identification Verified: Yes Signs or symptoms of abuse/neglect since last visito No Secondary Verification Process Completed: Yes Hospitalized since last visit: No Patient Requires Transmission-Based Precautions: No Implantable device outside of the clinic excluding No Patient Has Alerts: No cellular tissue based products placed in the center since last visit: Has Dressing in Place as Prescribed: Yes Has Footwear/Offloading in Place as Prescribed: Yes Left: T Contact Cast otal Pain Present Now: No Electronic Signature(s) Signed: 08/04/2022 5:13:40 PM By: Sharyn Creamer RN, BSN Entered By: Sharyn Creamer on 08/04/2022 14:17:37 -------------------------------------------------------------------------------- Encounter Discharge Information Details Patient Name: Date of Service: [name] 08/04/2022 2:00 PM Medical Record Number: [id number] Patient Account Number: [id number] Date of Birth/Sex: Treating RN: 1961-10-08 (61 y.o. Mare Ferrari Primary Care Mariafernanda Hendricksen: Billey Gosling Other Clinician: Referring Aneisha Skyles: Treating Shameria Trimarco/Extender: Genella Rife  in Treatment: 13 Encounter Discharge Information Items Post Procedure Vitals Discharge Condition: Stable Temperature (F): 97.6 Ambulatory Status: Walker Pulse (bpm): 92 Discharge Destination: Home Respiratory Rate (breaths/min): 18 Transportation: Private Auto Blood Pressure (mmHg): 130/79 Accompanied By: self Schedule Follow-up Appointment: Yes Clinical Summary of Care: Patient Declined Electronic Signature(s) Signed: 08/04/2022 5:13:40 PM By: Sharyn Creamer RN, BSN Entered By: Sharyn Creamer on 08/04/2022 14:59:05 [place], [name] ([id number]) [id number] Page 2 of 7 -------------------------------------------------------------------------------- Lower Extremity Assessment Details Patient Name: Date of Service: [name], [name] 08/04/2022 2:00 PM Medical Record Number: [id number] Patient Account Number: [id number] Date of Birth/Sex: Treating RN: [birth year] (61 y.o. Mare Ferrari Primary Care Markeeta Scalf: Billey Gosling Other Clinician: Referring Dannah Ryles: Treating Kassi Esteve/Extender: Genella Rife in Treatment: 13 Edema Assessment Assessed: [Left: No] [Right: No] Edema: [Left: N] [Right: o] Calf Left: Right: Point of Measurement: 37 cm From Medial Instep 38 cm Ankle Left: Right: Point of Measurement: 11 cm From Medial Instep 25 cm Electronic Signature(s) Signed: 08/04/2022 5:13:40 PM By: Sharyn Creamer RN, BSN Entered By: Sharyn Creamer on 08/04/2022 14:18:25 -------------------------------------------------------------------------------- Multi Wound Chart Details Patient Name: Date of Service: [name] 08/04/2022 2:00 PM Medical Record Number: [id number] Patient Account Number: [id number] Date of Birth/Sex: Treating RN: 12/25/1960 (61 y.o. Marcheta Grammes Primary Care Mylinda [name]: Billey Gosling Other Clinician: Referring Keo Schirmer: Treating Dynasia Kercheval/Extender: Genella Rife in Treatment: 13 Vital  Signs Height(in): 73 Pulse(bpm): 34 Weight(lbs): 237 Blood Pressure(mmHg): 130/79 Body Mass Index(BMI): 31.3 Temperature(F): 97.6 Respiratory Rate(breaths/min): 18 [5:Photos: No Photos Left, Plantar Foot Wound Location: Gradually Appeared Wounding Event: Diabetic Wound/Ulcer of the Lower Primary Etiology: Extremity Anemia, Sleep Apnea, Congestive Comorbid History: Heart Failure, Hypertension, Peripheral Venous Disease,  Cirrhosis , Type II Diabetes, Osteoarthritis, Osteomyelitis, Neuropathy 10/19/2019 Date Acquired: 13 Weeks of Treatment: Open Wound Status: No Wound Recurrence: 3.4x3.5x0.4 Measurements L x W x D (cm) 9.346 A (cm) : rea 3.738 Volume (cm) : -124.90% %  Reduction in Area: -800.70% % Reduction in Volume:] [N/A:N/A  N/A N/A N/A N/A N/A N/A N/A N/A N/A N/A N/A N/A N/A] [name], [name] ([id number]) [5:Grade 2 Classification: Medium Exudate A mount: Serosanguineous Exudate Type: red, brown Exudate Color: Thickened Wound Margin: Large (67-100%) Granulation A mount: Red, Pink, Hyper-granulation Granulation Quality: Small (1-33%) Necrotic A mount: Fat  Layer (Subcutaneous Tissue): Yes N/A Exposed Structures: Fascia: No Tendon: No Muscle: No Joint: No Bone: No None Epithelialization: Debridement - Excisional Debridement: Pre-procedure Verification/Time Out 14:37 Taken: Lidocaine 5% topical ointment Pain  Control: Subcutaneous, Slough Tissue Debrided: Skin/Subcutaneous Tissue Level: 11.9 Debridement A (sq cm): rea Curette Instrument: Moderate Bleeding: Silver Nitrate Hemostasis A chieved: 0 Procedural Pain: 0 Post Procedural Pain: Procedure was tolerated  well Debridement Treatment Response: 3.4x3.5x0.4 Post Debridement Measurements L x W x D (cm) 3.738 Post Debridement Volume: (cm) Callus: Yes Periwound Skin Texture: Excoriation: No Induration: No Crepitus: No Rash: No Scarring: No Maceration: No  Periwound Skin Moisture: Dry/Scaly: No Atrophie Blanche: No Periwound Skin Color: Cyanosis: No  Ecchymosis: No Erythema: No Hemosiderin Staining: No Mottled: No Pallor: No Rubor: No No Abnormality Temperature: Debridement Procedures Performed:] [N/A:N/A  N/A N/A N/A N/A N/A N/A N/A N/A N/A N/A N/A N/A N/A N/A N/A N/A N/A N/A N/A N/A N/A N/A N/A N/A N/A N/A N/A] Treatment Notes Wound #5 (Foot) Wound Laterality: Plantar, Left Cleanser Wound Cleanser Discharge Instruction: Cleanse the wound with wound cleanser prior to applying a clean dressing using gauze sponges, not tissue or cotton balls. Peri-Wound Care Topical Primary Dressing KerraCel Ag Gelling Fiber Dressing, 2x2 in (silver alginate) Discharge Instruction: Apply silver alginate to wound bed as instructed Secondary Dressing ABD Pad, 5x9 Discharge Instruction: Apply over primary dressing as directed. Drawtex 4x4 in Discharge Instruction: cut to into wound bed over the alginate. Secured With [employer], 4.5x3.1 (in/yd) Discharge Instruction: Secure with Kerlix as directed. 80M Medipore H Soft Cloth Surgical T ape, 4 x 10 (in/yd) Discharge Instruction: Secure with tape as directed. Compression Wrap Compression Stockings [place], [name] ([id number]) [id number] Page 4 of 7 Add-Ons Electronic Signature(s) Signed: 08/04/2022 4:28:13 PM By: Baltazar Najjar MD Signed: 08/13/2022 1:21:31 PM By: [name] Entered By: Baltazar Najjar on 08/04/2022 15:30:48 -------------------------------------------------------------------------------- Multi-Disciplinary Care Plan Details Patient Name: Date of Service: [name], [name] [name] 08/04/2022 2:00 PM Medical Record Number: [id number] Patient Account Number: [id number] Date of Birth/Sex: Treating RN: [birth year] (61 y.o. Cline Cools Primary Care Rye Decoste: [name] Other Clinician: Referring Leondre Taul: Treating Alita Waldren/Extender: Avie Echevaria in Treatment: 13 Active Inactive Necrotic Tissue Nursing Diagnoses: Impaired tissue  integrity related to necrotic/devitalized tissue Goals: Necrotic/devitalized tissue will be minimized in the wound bed Date Initiated: 06/11/2022 Target Resolution Date: 08/12/2022 Goal Status: Active Patient/caregiver will verbalize understanding of reason and process for debridement of necrotic tissue Date Initiated: 06/11/2022 Target Resolution Date: 08/12/2022 Goal Status: Active Interventions: Assess patient pain level pre-, during and post procedure and prior to discharge Treatment Activities: Apply topical anesthetic as ordered : 06/11/2022 Excisional debridement : 06/11/2022 Notes: Electronic Signature(s) Signed: 08/04/2022 5:13:40 PM By: Redmond Pulling RN, BSN Entered By: Redmond Pulling on 08/04/2022 14:48:07 -------------------------------------------------------------------------------- Pain Assessment Details Patient Name: Date of Service: [name] 08/04/2022 2:00 PM Medical Record Number: [id number] Patient Account Number: [id number] Date of Birth/Sex: Treating RN: [birth year] (61 y.o. Cline Cools Primary Care Larnie Heart: [name] Other Clinician: Referring Sania Noy: Treating Marijke Guadiana/Extender: Avie Echevaria in Treatment: 13 Active Problems Location of Pain Severity and Description of Pain [place], MARTENSEN ([id number]) [id number] Page 5 of 7 Patient  Has Paino No Site Locations Pain Management and Medication Current Pain Management: Electronic Signature(s) Signed: 08/04/2022 5:13:40 PM By: Redmond Pulling RN, BSN Entered By: Redmond Pulling on 08/04/2022 14:18:19 -------------------------------------------------------------------------------- Patient/Caregiver Education Details Patient Name: Date of Service: [name] 10/18/2023andnbsp2:00 PM Medical Record Number: [id number] Patient Account Number: [id number] Date of Birth/Gender: Treating RN: Jun 24, 1961 (61 y.o. Cline Cools Primary Care Physician:  [name] Other Clinician: Referring Physician: Treating Physician/Extender: Avie Echevaria in Treatment: 13 Education Assessment Education Provided To: Patient Education Topics Provided Pressure: Methods: Explain/Verbal Responses: State content correctly Wound/Skin Impairment: Methods: Explain/Verbal Responses: State content correctly Electronic Signature(s) Signed: 08/04/2022 5:13:40 PM By: Redmond Pulling RN, BSN Entered By: Redmond Pulling on 08/04/2022 14:48:34 [place], [name] ([id number]) [id number] Page 6 of 7 -------------------------------------------------------------------------------- Wound Assessment Details Patient Name: Date of Service: [name], [name] 08/04/2022 2:00 PM Medical Record Number: [id number] Patient Account Number: [id number] Date of Birth/Sex: Treating RN: 11/22/1960 (61 y.o. Cline Cools Primary Care Honesti Seaberg: [name] Other Clinician: Referring Jumar Greenstreet: Treating Yazeed Pryer/Extender: Avie Echevaria in Treatment: 13 Wound Status Wound Number: 5 Primary Diabetic Wound/Ulcer of the Lower Extremity Etiology: Wound Location: Left, Plantar Foot Wound Open Wounding Event: Gradually Appeared Status: Date Acquired: 10/19/2019 Comorbid Anemia, Sleep Apnea, Congestive Heart Failure, Hypertension, Weeks Of Treatment: 13 History: Peripheral Venous Disease, Cirrhosis , Type II Diabetes, Clustered Wound: No Osteoarthritis, Osteomyelitis, Neuropathy Wound Measurements Length: (cm) 3.4 Width: (cm) 3.5 Depth: (cm) 0.4 Area: (cm) 9.346 Volume: (cm) 3.738 % Reduction in Area: -124.9% % Reduction in Volume: -800.7% Epithelialization: None Wound Description Classification: Grade 2 Wound Margin: Thickened Exudate Amount: Medium Exudate Type: Serosanguineous Exudate Color: red, brown Foul Odor After Cleansing: No Slough/Fibrino Yes Wound Bed Granulation Amount: Large (67-100%)  Exposed Structure Granulation Quality: Red, Pink, Hyper-granulation Fascia Exposed: No Necrotic Amount: Small (1-33%) Fat Layer (Subcutaneous Tissue) Exposed: Yes Necrotic Quality: Adherent Slough Tendon Exposed: No Muscle Exposed: No Joint Exposed: No Bone Exposed: No Periwound Skin Texture Texture Color No Abnormalities Noted: No No Abnormalities Noted: No Callus: Yes Atrophie Blanche: No Crepitus: No Cyanosis: No Excoriation: No Ecchymosis: No Induration: No Erythema: No Rash: No Hemosiderin Staining: No Scarring: No Mottled: No Pallor: No Moisture Rubor: No No Abnormalities Noted: Yes Temperature / Pain Temperature: No Abnormality Electronic Signature(s) Signed: 08/04/2022 5:13:40 PM By: Redmond Pulling RN, BSN Entered By: Redmond Pulling on 08/04/2022 14:29:14 -------------------------------------------------------------------------------- Vitals Details Patient Name: Date of Service: [name] 08/04/2022 2:00 PM Medical Record Number: [id number] Patient Account Number: [id number] Date of Birth/Sex: Treating RN: [birth year] (61 y.o. Cline Cools Primary Care Indie Boehne: [name] Other Clinician: Referring Keiran Gaffey: Treating Holland Nickson/Extender: Ranell Patrick [name], [name] ([id number]) [id number] Page 7 of 7 Weeks in Treatment: 13 Vital Signs Time Taken: 14:17 Temperature (F): 97.6 Height (in): 73 Pulse (bpm): 92 Weight (lbs): 237 Respiratory Rate (breaths/min): 18 Body Mass Index (BMI): 31.3 Blood Pressure (mmHg): 130/79 Reference Range: 80 - 120 mg / dl Electronic Signature(s) Signed: 08/04/2022 5:13:40 PM By: Redmond Pulling RN, BSN Entered By: Redmond Pulling on 08/04/2022 14:18:13

## 2022-08-14 LAB — RHEUMATOID FACTOR: Rheumatoid fact SerPl-aCnc: <14 IU/mL (ref <14)

## 2022-08-14 LAB — ANTI-NUCLEAR AB-TITER (ANA TITER)
ANA TITER: 1:40 {titer} — ABNORMAL HIGH
ANA Titer 1: 1:80 {titer} — ABNORMAL HIGH

## 2022-08-14 LAB — CYCLIC CITRUL PEPTIDE ANTIBODY, IGG: Cyclic Citrullin Peptide Ab: <16 UNITS

## 2022-08-14 LAB — ANA: Anti Nuclear Antibody (ANA): POSITIVE — AB

## 2022-08-16 ENCOUNTER — Encounter: Payer: Self-pay | Admitting: Internal Medicine

## 2022-08-16 DIAGNOSIS — R768 Other specified abnormal immunological findings in serum: Secondary | ICD-10-CM

## 2022-08-16 DIAGNOSIS — M2550 Pain in unspecified joint: Secondary | ICD-10-CM

## 2022-08-20 ENCOUNTER — Encounter (HOSPITAL_BASED_OUTPATIENT_CLINIC_OR_DEPARTMENT_OTHER): Payer: Medicare Other | Attending: Internal Medicine | Admitting: Internal Medicine

## 2022-08-20 DIAGNOSIS — I5032 Chronic diastolic (congestive) heart failure: Secondary | ICD-10-CM | POA: Insufficient documentation

## 2022-08-20 DIAGNOSIS — E1140 Type 2 diabetes mellitus with diabetic neuropathy, unspecified: Secondary | ICD-10-CM | POA: Insufficient documentation

## 2022-08-20 DIAGNOSIS — M86372 Chronic multifocal osteomyelitis, left ankle and foot: Secondary | ICD-10-CM | POA: Diagnosis not present

## 2022-08-20 DIAGNOSIS — L97522 Non-pressure chronic ulcer of other part of left foot with fat layer exposed: Secondary | ICD-10-CM | POA: Insufficient documentation

## 2022-08-20 DIAGNOSIS — G4733 Obstructive sleep apnea (adult) (pediatric): Secondary | ICD-10-CM | POA: Diagnosis not present

## 2022-08-20 DIAGNOSIS — E11621 Type 2 diabetes mellitus with foot ulcer: Secondary | ICD-10-CM | POA: Insufficient documentation

## 2022-08-20 DIAGNOSIS — E039 Hypothyroidism, unspecified: Secondary | ICD-10-CM | POA: Insufficient documentation

## 2022-08-20 DIAGNOSIS — I110 Hypertensive heart disease with heart failure: Secondary | ICD-10-CM | POA: Insufficient documentation

## 2022-08-20 DIAGNOSIS — M1990 Unspecified osteoarthritis, unspecified site: Secondary | ICD-10-CM | POA: Diagnosis not present

## 2022-08-20 DIAGNOSIS — I872 Venous insufficiency (chronic) (peripheral): Secondary | ICD-10-CM | POA: Diagnosis not present

## 2022-08-21 NOTE — Progress Notes (Signed)
JD, MCMAKIN ([id number]) [id number] Page 1 of 12 Visit Report for 08/20/2022 Debridement Details Patient Name: Date of Service: [name], [name] 08/20/2022 11:15 A M Medical Record Number: [id number] Patient Account Number: [id number] Date of Birth/Sex: Treating RN: 03-13-1961 (61 y.o. M) Primary Care Provider: Billey Gosling Other Clinician: Referring Provider: Treating Provider/Extender: Genella Rife in Treatment: 15 Debridement Performed for Assessment: Wound #5 Left,Plantar Foot Performed By: Physician Ricard Dillon., MD Debridement Type: Debridement Severity of Tissue Pre Debridement: Fat layer exposed Level of Consciousness (Pre-procedure): Awake and Alert Pre-procedure Verification/Time Out Yes - 12:05 Taken: Start Time: 12:06 Pain Control: Lidocaine 5% topical ointment T Area Debrided (L x W): otal 3.3 (cm) x 2.8 (cm) = 9.24 (cm) Tissue and other material debrided: Viable, Non-Viable, Callus, Slough, Subcutaneous, Skin: Dermis , Skin: Epidermis, Slough Level: Skin/Subcutaneous Tissue Debridement Description: Excisional Instrument: Curette Bleeding: Moderate Hemostasis Achieved: Pressure End Time: 12:11 Procedural Pain: 0 Post Procedural Pain: 0 Response to Treatment: Procedure was tolerated well Level of Consciousness (Post- Awake and Alert procedure): Post Debridement Measurements of Total Wound Length: (cm) 3.3 Width: (cm) 2.8 Depth: (cm) 0.5 Volume: (cm) 3.629 Character of Wound/Ulcer Post Debridement: Improved Severity of Tissue Post Debridement: Fat layer exposed Post Procedure Diagnosis Same as Pre-procedure Electronic Signature(s) Signed: 08/20/2022 12:29:37 PM By: Linton Ham MD Entered By: Linton Ham on 08/20/2022 12:23:50 -------------------------------------------------------------------------------- HPI Details Patient Name: Date of Service: [name] IG 08/20/2022 11:15 A M Medical  Record Number: [id number] Patient Account Number: [id number] Date of Birth/Sex: Treating RN: 05-05-1961 (61 y.o. M) Primary Care Provider: Billey Gosling Other Clinician: Referring Provider: Treating Provider/Extender: Genella Rife in Treatment: 15 History of Present Illness [name], [name] ([id number]) [id number] Page 2 of 12 HPI Description: 10/31/2019 upon evaluation today patient appears to be doing somewhat poorly in regard to his bilateral plantar feet. He has wounds that he tells me have been present since 2012 intermittently off and on. Most recently this has been open for at least the past 6 months to a year. He has been trying to treat this in different ways using Santyl along with various other dressings including Medihoney and even at one point Xeroform. Nothing really has seem to get this completely closed. He was recently in the hospital for cellulitis of his leg subsequently he did have x-rays as well as MRIs that showed negative for any signs of osteomyelitis in regard to the wounds on his feet. Fortunately there is no signs of systemic infection at this time. No fevers, chills, nausea, vomiting, or diarrhea. Patient has previously used [employer] offloading shoes as far as frontal floaters as well as postop surgical shoes. He has never been in a total contact cast that may be something we need to strongly consider here. Patient's most recent hemoglobin A1c 1 month ago was 5.3 seems to be very well controlled which is great. Subsequently he has seen vascular as well as podiatry. His ABIs are 1.07 on the left and 1.14 on the right he seems to be doing well he does have chronic venous stasis. 11/07/2019 upon evaluation today patient appears to be doing well with regard to his wounds all things considered. I do not see any severe worsening he still has some callus buildup on the right more than the left he notes that he has been probably more  active than he should as far as walking is concerned is just very hard to not be active. He knows he needs to be more  careful in this regard however. He is willing to give the cast a try at this point although he notes that he is a little nervous about this just with regard to balance although he will be very careful and obviously if he has any trouble he knows to contact the office and let me know. 1/22; patient is in for his obligatory first total contact cast change. Our intake nurse reported a very large amount of drainage which is spelled out over to the surrounding skin. Has bilateral diabetic foot wounds. He has Charcot feet. We have been using silver alginate on his wounds. 11/14/2019 on evaluation today patient is actually seeming to make good improvement in regard to his bilateral plantar foot wounds. We have been using a cast on the left side and on the right side he has been using dressings he is changing up his own accord. With that being said he tells me that he is also not walking as much just due to how unsteady he feels. He takes it easy when he does have to walk and when he does not have to walk he is resting. This is probably help in his right foot as well has the left foot which is actually measuring better. In fact both are measuring better. Overall I am very pleased with how things seem to be progressing. The patient does have some odor on the left foot this does have me concerned about the possibility of infection, and actually probably go ahead and put him on antibiotics today as well as utilizing a continuation of the cast on the left foot I think that will be fine we probably just need to bring him in sooner to change this not last a whole week. 1/29; we brought the patient back today for a total contact cast change on the left out of concern for excessive drainage. We are using drawtex over the wound as the primary dressing 11/21/2019 on evaluation today patient appears to be  doing well with regard to his left plantar foot. In fact both foot ulcers actually seem to be doing pretty well. Nonetheless he is having a lot of drainage on the left at this time and again we did obtain a wound culture did show positive for Staph aureus that was reviewed by myself today as well. Nonetheless he is on Bactrim which was shown to be sensitive that should be helping in this regard. Fortunately there is no signs of infection systemically at this point. 2/5; back in clinic today for a total contact cast change apparently secondary to very significant drainage. Still using drawtex 11/28/2019 upon evaluation today patient appears to be doing well with regard to his wounds. The right foot is doing okay as measured about the same in my opinion. The left foot is actually showing signs of significant improvement is measuring smaller there is a lot of hyper granulation likely due to the continued drainage at this point. We did obtain approval for a snap VAC I think that is good to be appropriate for him and will likely help this tremendously underneath the cast. He is definitely in agreement with proceeding with such. 2/12; patient came in today after his snap VAC lost suction. Brought in to see one of our nurses. The dressing was replaced and then we put the cast back on and rehooked up the snap VAC. Apparently his wound looked very good per our intake nurse. 2/15; again we replaced the cast on Friday. By Saturday the snap  VAC and light suction. He called this morning he comes in acutely. The wounds look fine however the VAC is not functioning. We replaced the cast using silver alginate as the primary dressing backed with Kerramax. The snap VAC was not replaced 12/05/2019 upon evaluation today patient appears to be doing better in regard to his left plantar foot ulcer. Fortunately there is no signs of active infection at this time. Unfortunately he is continuing to have issues with the right foot  he is really not making any progress here things seem to be somewhat stagnant to be honest. The depth has increased but that is due to me having debrided the wound in the past based on what I am seeing. 12/12/2019 on evaluation today patient appears to be doing more poorly in regard to the left lower extremity. He has some erythema spreading up the side of his foot I am concerned about infection again at this point. Unfortunately he has been seeing improvement with a total contact cast but I do not think we should put that on today. On his right plantar foot he continues to have significant drainage this is actually measuring deeper I really do not feel like you are making any progress whatsoever. I have prescribed Granix for him unfortunately his insurance apparently was going to cost him a $500 co-pay. 12/19/2019 upon evaluation today patient actually appears to be doing better in regard to both wounds. With that being said he actually did get the reGranix which he had to pay $500 for. With that being said it does look like that he is actually made some improvement based on what I am seeing at this point with the reGranix. Obviously if he is going to continue this we are going to do something about trying to get him some help in covering the cost. 12/26/2019 on evaluation today patient appears to be doing really much better even compared to last week. Overall the wound seems to be much better even compared to last week and last week was better than the week before. Since has been using the reGranix his symptoms have improved significantly. With that being said the issue right now is simply that this is a very expensive medication for him the first dose cost him $500. Upon inspection patient's wound bed actually is however dramatically improved compared to before he started this 2 weeks ago. 01/02/2020 upon evaluation today patient appears to actually be doing well. He still had a little bit of reGranix  left that has been using in small amounts he just been applying it every other day instead of every day in order to make it go longer. Overall we are still seeing excellent improvement he is measuring smaller looking better healthier tissue and everything seems to be pointing to this headed in the right direction. Fortunately there is no evidence of infection either which is also excellent news. He does have his MRI coming up within the next week. 01/09/2020 upon evaluation today patient appears to be doing a little worse today compared to previous week's evaluation. He is actually been out of the reGranix at this point. He has been trying to make the stretch out so he has been changing the dressings on a regular set schedule like he was previous. I think this has made a difference. Fortunately there is no signs of active infection at this time. No fevers, chills, nausea, vomiting, or diarrhea. 01/16/2020 upon evaluation today patient appears to be doing well with regard to his  left plantar foot ulcer. The right plantar foot still shows some significant depth at this point. Fortunately there is no signs of active infection at this time. 01/30/2020 upon evaluation today patient appears to be doing about the same in regard to his right plantar foot ulcer there is still some depth here and we had to wait till he actually switched over to his new insurance to get approval for the MRI under his new insurance plan. With that being said he now has switched as of April 1. Fortunately there is no signs of active infection at this time. Overall in regard to his left foot ulcer this seems to be doing much better and I am actually very pleased with how things are going. With that being said it is not quite as much progress as we were seeing with the reGranix but at the same time he has had trouble getting this apparently there is been some hindrance here. I Georgina Peer try to actually send this to [facility] that was  recommended by the drug rep to me. 02/13/20 upon evaluation today patient appears to be doing better in regard to his left foot ulcer this is great news. Unfortunately the right foot ulcer is not really significantly better at this time. There is no signs of active infection systemically though he did have his MRI which showed unfortunately he does have infection noted including an abscess in the foot. There is also marrow changes noted which are consistent with osteomyelitis based on the radiology review and interpretation. Unfortunately considering that the wound is not really making the progress that we will he would like to be seen I think that this is an indication that he may need some further referral both infectious disease as well as potentially to podiatrist to see if there is anything that can be done to help with the situation that were dealing with here. The left foot again is doing great. READMISSION 06/04/2021 [name], [name] ([id number]) [id number] Page 3 of 12 This is a 61 year old man who was in the clinic in 2021 followed by Jeri Cos for areas on the right and left foot. He developed a left foot infection and was referred to ID. He left the clinic in a nonhealed state and was followed for a period of [facility] Cannon Kettle. Apparently things really deteriorated in early July when he was admitted to hospital from 04/27/2021 through 05/06/2021 with sepsis secondary to a left foot infection. His blood cultures were negative. An MRI suggested fifth metatarsal osteomyelitis a left ankle septic joint. He was treated with vancomycin and ertapenem which he is still taking and may just about be finishing. He was seen by orthopedics and the patient adamantly refused to BKA. As far as I can tell he did not have the ankle aspirated I am not exactly sure what the issue was here. Since he has been discharged she is at [facility] for  rehabilitation. He was last seen by Dr. Drucilla Schmidt on 05/20/2021 he noted osteo of the tibial talar bone cuboid and fifth metatarsal which is even more extensive than what was suggested by the MRI. He is apparently going for a consultation with orthopedic surgery in [place] sometime next week. I received a call about this man 2 weeks ago from Dr. Gwenlyn Found who follows him for the possibility of PAD. ABIs I think done in the office showed a ABI on the right of 1.05 at the PTA and 0.99 at the PTA  on the left. He had a DVT rule out in the left leg that was negative for the DVT. Past medical history is extensive and includes diastolic heart failure, right first metatarsal head ulcer in 2021, excision of the right second ray by Dr. Cannon Kettle on 03/13/2020, hypertension, hypothyroidism. Left total hip replacement, right total knee replacement, carpal tunnel syndrome, obstructive sleep apnea alcohol abuse with cirrhosis although the patient denies current alcohol intake. The patient does not think he is a diabetic however looking through  link I see 2 HgbAic's of this year that were greater than equal to 6.5 which by definition makes him diabetic. Nevertheless he is not on any treatment and does not check his blood sugars. The patient is now back home out of the nursing home. Saw Dr. Drucilla Schmidt last week he was taken off vancomycin and ertapenem on August 22 and now is on doxycycline on Augmentin. He also saw Dr. Buford Dresser who is his orthopedic surgeon in [place] he recommended a [employer]. He has been using Medihoney. 9/6I have been having trouble getting hyperbarics approved through our prior authorization process. Even though he had a limb threatening infection in the left foot and probably the left ankle there glitches in how some of the reports are worded also some of the consultants. In any case I am going to repeat his sedimentation rate and C-reactive protein. I am generally not in favor of doing things  like this as they really do not alter the plan of care from my point of view however I am going to need to demonstrate that these remain high in order to get this through forhyperbaric treatment for chronic refractory osteomyelitis 9/13; following this man for a wound on his left plantar foot in the setting of type 2 diabetes and Charcot deformity. He has underlying chronic refractory osteomyelitis. Follow-up sedimentation rate and C-reactive protein were both elevated but the C-reactive protein was down to 1.4, sedimentation rate at 70. Sedimentation rate was only slightly down from previous at 85. His wound is measuring slightly smaller. 9/20; patient started hyperbaric oxygen therapy today and tolerated treatment well. This is for the underlying osteomyelitis. He remains on antibiotics but thinks he is getting close to finishing. The wound on the plantar aspect of his foot is the other issue we are following here. He is using Medihoney The patient has a Charcot foot in the setting of type 2 diabetes. He is going to need a total contact cast although his partner was away this week and we elected to delay this till next week 9/27; patient still tolerating hyperbaric oxygen well. Wound looked generally healthy not much depth under illumination still 100% covered in fibrinous debris. Raised callused edges around the wound he was prepared for a total contact cast. We have been using Medihoney 9/30; patient is back for his first obligatory total contact cast change. We are using Hydrofera Blue. Noted by our intake nurse to have a lot of drainage or at least a moderate amount of drainage. I am not sure I was previously aware of this 10/4; patient arrives today with a lot of drainage under the cast. When he had it changed last Friday there was also a similar amount of drainage. Her intake nurse says that they tried a wound VAC on him perhaps while I was on vacation in August under the cast but that did not  work. In my experience that has not been unusual. We have been using Hydrofera Blue with all  the secondary absorbers. The drainage today went right through all of our dressings. The patient is concerned about his foot being in a cast without much drainage. He is tolerating HBO well. There has been improvements in the wound in the mid part of his foot in the setting of a Charcot deformity 10/7; patient presents for cast change. He has no issues or complaints today. He denies signs of infection. 10/11; patient presents for cast change. At this time he would like to take a break from the cast. He would like to do daily dressing changes with Hydrofera Blue. He currently denies signs of infection. 10/14; his cast was taken off last week at his request. He arrives in the clinic with [facility]. He has been changing the dressing himself. He has way too much edema in the left foot and leg to consider a total contact cast. I do not really know the issue here. He does have chronic venous insufficiency His wife stopped me in the clinic earlier in the week to report he is drinking again and she is concerned. I am uncertain whether there are other issues 10/18; he arrived in clinic last week having bilateral lower extremity edema likely secondary to chronic venous insufficiency there was too much edema in the left leg to apply a total contact cast. I put him in compression on the left leg to control the swelling. This week he cut the wrap on the foot for reasons that are unclear however today he arrives in clinic with a smaller left leg but massive edema in the left foot. He is supposed to be wearing a juxta lite stocking on the right leg but he is not wearing the contact layer. His attendance at hyperbaric oxygen has been dwindling, he did not dive yesterday and he did not dive today concerned about hyperbarics causing swelling. I looked back in his record his last echo was in 2020 essentially normal left and  right ventricular function. Last BUN and creatinine were done in July this was normal. He is having a lot of drainage in the left plantar foot wound 10/25; again he comes in today having missed HBO yesterday. Macerated skin around the wound which really does not look very good at all. A lot of edema in the left foot but an improvement in edema on the left leg. We wrapped him last week because of the amount of swelling in the left leg. We could not apply a total contact cast. The patient states that he wants to be able to change his dressing himself. I might consider this if he had stockings to control the swelling. He said he be here for HBO tomorrow. I will check the degree of erythema in his forefoot which I have marked. He is on doxycycline and Augmentin which was renewed by Dr. Drucilla Schmidt but I cannot see a follow-up note, follow-up inflammatory markers etc. I 1 point he said he was going to see his orthopedic surgeon in [place] I am not sure if he ever did this. I do not know why he has not followed up with infectious disease, he says he was not given an appointment but he is still on Augmentin doxycycline 11/1; he did not do the lab work I ordered last week. Still on Augmentin and doxycycline. Silver alginate and he is changing this daily using his own juxta lite stocking. The surface of the wound does not look too bad. No real epithelialization however. The patient was seen today along  with HBO 08/26/2021 upon evaluation today patient is being seen at his request by myself he wanted to transfer care to see me. With that being said he has been seeing Dr. Dellia Nims since he came back in August of this year. Unfortunately he tells me he still having quite a bit of drainage. He is also significant erythema and warmth noted of the foot as well. He has been hit or miss with regard to hyperbaric oxygen therapy tells me that at this point he took this week off because he was extremely claustrophobic and  having a lot of issues he plans to start back next week. Nonetheless he has been given some medication by Dr. Dellia Nims as well to help calm things down which again may help him. Hopefully he can get back in hyperbarics as I think this is likely necessary. Nonetheless there does seem to be evidence of cellulitis noted today as well and in general I am a little concerned in that regard. His wound unfortunately is significant on this left foot and is right where he takes the brunt of the force secondary to the Charcot arthropathy. I do not think a total contact cast is ideal due to the fact that he unfortunately is draining much too significantly he is also not happy with the idea of using a cast therefore he states he would not want to do that anyway. 09/16/2021 upon evaluation today patient's plantar foot ulcer unfortunately continues to show signs of issues here. He has been in the hospital due to trying to detox himself from alcohol he had withdrawal symptoms and subsequently was hospitalized. During that time it was recommended by both orthopedics as well as infectious disease apparently that he proceed with amputation. With that being said they discussed with him the risk of not doing so. This is well- documented in the encounter. With that being said the patient does not want to proceed down that road and tells me that he declined their advice in that regard. Subsequently our goal then is to try to do what we can to try to get this thing healed and closed. I think this means he is getting need to stay off of it is much as possible he does have a wheelchair at home that he tells me he can use I think that that is going to be something that he does need to do. 09/23/2021 upon evaluation today patient continues to have significant issues here with his foot ulcer which is plantar on the left foot. Subsequently again he does have a history of Charcot foot he also has an issue of having had osteomyelitis as  well as an open wound for some time here. Its been recommended multiple times for him to have an amputation although that is not something that he is really interested or wanting to do. 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 Subsequently this means that he also has a gait abnormality that is causing issues with his walking and [name], [name] ([id number]) [id number] Page 4 of 12 causing abnormal pressures in the central part of his foot this is the reason he has a wound. Nonetheless I want to see what I can do about trying to get a boot to help with stabilization and offloading of working to see what we can do in that regard. 09/30/2021 upon evaluation today patient appears to be doing well with regard to his plantar foot ulcer. He still has significant granular  hypergranulation but again this is much less than what it was even last time I saw him last week. Fortunately I think we are headed in the right direction. This is definitely something we could consider a skin graft or something along those lines if we can get it flattened out enough and get the drainage from being so significant. I feel like we are making some headway here. 10/07/2021 upon evaluation today patient appears to be doing decently well in regard to his wound. Fortunately there does not appear to be any signs of infection overall I feel like it is actually showing some signs of improvement. He still has some hypergranulation but this is much less than its been. 10/21/2021 upon evaluation today patient appears to be doing well with regard to his wound is actually showing some signs of improvement which is great. We have been doing the chemical cauterization with silver nitrate as well as debridement as needed and again has been using silver alginate up to this point. With that being said he does have some Hydrofera Blue at home he wonders if that can be  beneficial at this point. 11/25/2021 upon evaluation today patient appears to be doing somewhat poorly in regard to his plantar foot ulcer. He is also been having some issues with his congestive heart failure. He has recently been in the hospital. I did review those notes as well and apparently his heart failure has been somewhat out of control. He is actually being managed as an outpatient in this regard but nonetheless this is something that can still be kept a close eye on according to the notes and what I see. Fortunately I do not see any signs of active infection at this time which is great news. Nonetheless I am concerned about the boggy central portion of the wound which I think is going to likely open up in the near future. Readmission: 01-27-2022 patient presents for follow-up here in the clinic today. His last been November 25, 2021 since have seen him. At that time we just ordered an MRI fortunately the MRI did not show any obvious signs of osteomyelitis and there were some reactive changes which were questionable and could not be excluded. With that being said in the interim since have seen him back on February 8 things have gotten worse from the standpoint of how the wound appears today. I do think that he is going to require potentially more debridement to clean this wound out than what I can even offer here in the clinic there is a tremendous amount of hypergranulation tissue which is not sufficient to grow new tissue over and honestly I think this is going to lead to a delay in healing if its not taking care of. I need to see if I can get him into see a surgeon to get an opinion on whether or not they could take him to the OR for an aggressive surgical debridement to clear out this hypergranulation tissue and achieve hemostasis which is can be the biggest issue with me here in the clinic as he does tend to bleed even with very light superficial debridements. The patient's not opposed to  this and he states that if I have someone that like for him to see he would be happy to go. Otherwise his medical history really has not changed. He does tell me he is getting ready to go for an evaluation for CPAP machine. Readmission 05/04/2022 Patient was last seen 3 months ago. He  states he went to rehab for alcohol abuse. He continues to have a wound on the plantar aspect of the left foot. He states this has never completely healed over the past several years. He is following with podiatry for this issue currently and they are recommending blast X with collagen and a cam boot. He has not started this yet. He has home health. Currently he is using silver alginate. He also follows with Dr. Algis Liming infectious disease who ordered an MRI of the foot as he is concerned for osteomyelitis. CRP and sed rate are elevated. He is currently on Augmentin for prophylaxis as he has a history of osteomyelitis to this foot. Infectious disease has recommended amputation. Currently patient denies systemic signs of infection. 8/7; patient is following up with podiatry on 8/11. He wanted to come in to be debrided. He has no issues or complaints today. He has been using silver alginate to the wound bed. He has home health that helps change the dressings. He had an MRI completed on 7/18 that did not show evidence of osteomyelitis. He is scheduled to see infectious disease on 8/16. 8/25; Patient is following with podiatry for his wound care and he is currently using silver alginate and a defender boot for his Left foot wound. He would like to have debridement to the wound but is unable to see podiatry until the end of the month. He has asked if we could debride him. 9/21; patient follows with podiatry for his foot wound but likes to come into our clinic for debridements. He was last seen on 06/24/2022 by Dr. Allena Katz, podiatry. He is currently using blast X to the wound bed. He has been using a Psychologist, forensic. He is not  scheduled to see podiatry until 10/11 and comes in for debridement today. 10/9; patient presents for follow-up. He states he is no longer following with podiatry. He has been doing silver alginate to the wound bed. He states he would like to attempt a total contact cast but starting next week. He currently denies systemic signs of infection. 10/16; patient presents for follow-up. He has been using silver alginate to the wound bed. We discussed having the cast placed today and patient would like to proceed with this. He has follow-up in 2 days for his cast exchange. He currently denies signs of infection. 10/18; total contact cast placed 2 days ago. He comes back in for the obligatory total contact cast change HOWEVER our intake nurse noted copious drainage soaking right through the dressings and asked me to look at this. Indeed there was a large amount of drainage soaking right through all of the dressings that were applied. I cannot imagine that this would last 7 days. Even if we change the cast twice a week I would be concerned. We worked on this patient through the late part of 2022 in the early 2023 for this same wound. We attempted a cast at that time I think even a cast with a wound VAC and for 1 reason or another we could never make this progress towards closure. Fortunately his recent MRI did not show osteomyelitis. 10/26; patient presents for follow-up. He has been using silver alginate to the wound bed. He has no issues or complaints today. 11/3; wound edges are macerated secondary to copious drainage. PCR culture came back showing MRSA this has been sent to [facility] but we do not yet have that available.. Nursing reports still copious drainage Electronic Signature(s) Signed: 08/20/2022 12:29:37 PM By: Leanord Hawking,  Legrand Como MD Entered By: Linton Ham on 08/20/2022 12:24:45 -------------------------------------------------------------------------------- Physical Exam Details Patient Name:  Date of Service: [name], [name] 08/20/2022 11:15 A M Medical Record Number: [id number] Patient Account Number: [id number] Date of Birth/Sex: Treating RN: 1961/08/03 (61 y.o. Nimit Melnikov, [name] ([id number]) [id number] Page 5 of 12 Primary Care Provider: Billey Gosling Other Clinician: Referring Provider: Treating Provider/Extender: Genella Rife in Treatment: 15 Constitutional Patient is hypertensive.. Pulse regular and within target range for patient.Marland Kitchen Respirations regular, non-labored and within target range.. Temperature is normal and within the target range for the patient.Marland Kitchen Appears in no distress. Notes Wound exam; plantar aspect midfoot left. I used a #5 curette to knock down callus and macerated skin from the wound edge and fibrinous debris from the wound surface. Hemostasis with silver nitrate and a pressure dressing. There is still undermining here especially laterally Electronic Signature(s) Signed: 08/20/2022 12:29:37 PM By: Linton Ham MD Entered By: Linton Ham on 08/20/2022 12:25:43 -------------------------------------------------------------------------------- Physician Orders Details Patient Name: Date of Service: [name], [name] IG 08/20/2022 11:15 A M Medical Record Number: [id number] Patient Account Number: [id number] Date of Birth/Sex: Treating RN: 09/28/1961 (61 y.o. [name] Primary Care Provider: Billey Gosling Other Clinician: Referring Provider: Treating Provider/Extender: Genella Rife in Treatment: 15 Verbal / Phone Orders: No Diagnosis Coding ICD-10 Coding Code Description E11.621 Type 2 diabetes mellitus with foot ulcer L97.522 Non-pressure chronic ulcer of other part of left foot with fat layer exposed M86.372 Chronic multifocal osteomyelitis, left ankle and foot M14.672 Charcot's joint, left ankle and foot Follow-up Appointments ppointment in 2 weeks. - Dr. Heber Henderson Return  A Other: - start using topical antibiotics from [facility] when arrives and also bring into clinic at appt times. Anesthetic (In clinic) Topical Lidocaine 5% applied to wound bed (In clinic) Topical Lidocaine 4% applied to wound bed Cellular or Tissue Based Products Cellular or Tissue Based Product Type: - Run IVR for Grafix=20% Copay Bathing/ Shower/ Hygiene May shower with protection but do not get wound dressing(s) wet. - May use cast protector bag from [facility], [employer] or [facility] Edema Control - Lymphedema / SCD / Other Elevate legs to the level of the heart or above for 30 minutes daily and/or when sitting, a frequency of: - throughout the day Avoid standing for long periods of time. Exercise regularly Moisturize legs daily. Compression stocking or Garment 30-40 mm/Hg pressure to: - wear the Juxtalite HD to right and left leg. apply in the morning and remove at night. Off-Loading Other: - Defender Boot: minimal weight bearing left foot use wheelchair for mobility. Additional Orders / Instructions Follow Nutritious Diet Wound Treatment Wound #5 - Foot Wound Laterality: Plantar, Left Cleanser: Wound Cleanser 1 x Per Day/30 Days Discharge Instructions: Cleanse the wound with wound cleanser prior to applying a clean dressing using gauze sponges, not tissue or cotton balls. PRINCETIN, BIHL ([id number]) [id number] Page 6 of 12 Topical: Mupirocin Ointment 1 x Per Day/30 Days Discharge Instructions: Apply Mupirocin in clinic only. Topical: blastx 1 x Per Day/30 Days Discharge Instructions: please apply blastx daily until the topical antibiotics arrives. Topical: topical antibiotics 1 x Per Day/30 Days Discharge Instructions: once arrives stop blastx and use topical antibiotics from [facility]. Prim Dressing: Maxorb Extra Calcium Alginate Dressing, 4x4 in 1 x Per Day/30 Days ary Discharge Instructions: Apply calcium alginate to wound bed as  instructed Secondary Dressing: ABD Pad, 5x9 ([place]) 1 x Per Day/30 Days Discharge Instructions: Apply over primary dressing as directed. Secondary  Dressing: Drawtex 4x4 in 1 x Per Day/30 Days Discharge Instructions: cut to into wound bed over the alginate. Secured With: [employer], 4.5x3.1 (in/yd) 1 x Per Day/30 Days Discharge Instructions: Secure with Kerlix as directed. Secured With: 37M Medipore H Soft Cloth Surgical T ape, 4 x 10 (in/yd) 1 x Per Day/30 Days Discharge Instructions: Secure with tape as directed. Electronic Signature(s) Signed: 08/20/2022 12:29:37 PM By: Linton Ham MD Signed: 08/20/2022 5:25:29 PM By: Deon Pilling RN, BSN Entered By: Deon Pilling on 08/20/2022 12:17:26 -------------------------------------------------------------------------------- Problem List Details Patient Name: Date of Service: [name], [name] IG 08/20/2022 11:15 A M Medical Record Number: [id number] Patient Account Number: [id number] Date of Birth/Sex: Treating RN: [birth year] (61 y.o. [name] Primary Care Provider: Billey Gosling Other Clinician: Referring Provider: Treating Provider/Extender: Genella Rife in Treatment: 15 Active Problems ICD-10 Encounter Code Description Active Date MDM Diagnosis E11.621 Type 2 diabetes mellitus with foot ulcer 05/04/2022 No Yes L97.522 Non-pressure chronic ulcer of other part of left foot with fat layer exposed 05/04/2022 No Yes M86.372 Chronic multifocal osteomyelitis, left ankle and foot 05/04/2022 No Yes M14.672 Charcot's joint, left ankle and foot 05/04/2022 No Yes Inactive Problems Resolved Problems INOCENCIO, MAVES ([id number]) [id number] Page 7 of 12 Electronic Signature(s) Signed: 08/20/2022 12:29:37 PM By: Linton Ham MD Entered By: Linton Ham on 08/20/2022 12:23:33 -------------------------------------------------------------------------------- Progress Note  Details Patient Name: Date of Service: [name], [name] IG 08/20/2022 11:15 A M Medical Record Number: [id number] Patient Account Number: [id number] Date of Birth/Sex: Treating RN: 1961-08-03 (61 y.o. M) Primary Care Provider: Billey Gosling Other Clinician: Referring Provider: Treating Provider/Extender: Genella Rife in Treatment: 15 Subjective History of Present Illness (HPI) 10/31/2019 upon evaluation today patient appears to be doing somewhat poorly in regard to his bilateral plantar feet. He has wounds that he tells me have been present since 2012 intermittently off and on. Most recently this has been open for at least the past 6 months to a year. He has been trying to treat this in different ways using Santyl along with various other dressings including Medihoney and even at one point Xeroform. Nothing really has seem to get this completely closed. He was recently in the hospital for cellulitis of his leg subsequently he did have x-rays as well as MRIs that showed negative for any signs of osteomyelitis in regard to the wounds on his feet. Fortunately there is no signs of systemic infection at this time. No fevers, chills, nausea, vomiting, or diarrhea. Patient has previously used Darco offloading shoes as far as frontal floaters as well as postop surgical shoes. He has never been in a total contact cast that may be something we need to strongly consider here. Patient's most recent hemoglobin A1c 1 month ago was 5.3 seems to be very well controlled which is great. Subsequently he has seen vascular as well as podiatry. His ABIs are 1.07 on the left and 1.14 on the right he seems to be doing well he does have chronic venous stasis. 11/07/2019 upon evaluation today patient appears to be doing well with regard to his wounds all things considered. I do not see any severe worsening he still has some callus buildup on the right more than the left he notes that he has been probably  more active than he should as far as walking is concerned is just very hard to not be active. He knows he needs to be more careful in this regard however. He is willing to give  the cast a try at this point although he notes that he is a little nervous about this just with regard to balance although he will be very careful and obviously if he has any trouble he knows to contact the office and let me know. 1/22; patient is in for his obligatory first total contact cast change. Our intake nurse reported a very large amount of drainage which is spelled out over to the surrounding skin. Has bilateral diabetic foot wounds. He has Charcot feet. We have been using silver alginate on his wounds. 11/14/2019 on evaluation today patient is actually seeming to make good improvement in regard to his bilateral plantar foot wounds. We have been using a cast on the left side and on the right side he has been using dressings he is changing up his own accord. With that being said he tells me that he is also not walking as much just due to how unsteady he feels. He takes it easy when he does have to walk and when he does not have to walk he is resting. This is probably help in his right foot as well has the left foot which is actually measuring better. In fact both are measuring better. Overall I am very pleased with how things seem to be progressing. The patient does have some odor on the left foot this does have me concerned about the possibility of infection, and actually probably go ahead and put him on antibiotics today as well as utilizing a continuation of the cast on the left foot I think that will be fine we probably just need to bring him in sooner to change this not last a whole week. 1/29; we brought the patient back today for a total contact cast change on the left out of concern for excessive drainage. We are using drawtex over the wound as the primary dressing 11/21/2019 on evaluation today patient appears to  be doing well with regard to his left plantar foot. In fact both foot ulcers actually seem to be doing pretty well. Nonetheless he is having a lot of drainage on the left at this time and again we did obtain a wound culture did show positive for Staph aureus that was reviewed by myself today as well. Nonetheless he is on Bactrim which was shown to be sensitive that should be helping in this regard. Fortunately there is no signs of infection systemically at this point. 2/5; back in clinic today for a total contact cast change apparently secondary to very significant drainage. Still using drawtex 11/28/2019 upon evaluation today patient appears to be doing well with regard to his wounds. The right foot is doing okay as measured about the same in my opinion. The left foot is actually showing signs of significant improvement is measuring smaller there is a lot of hyper granulation likely due to the continued drainage at this point. We did obtain approval for a snap VAC I think that is good to be appropriate for him and will likely help this tremendously underneath the cast. He is definitely in agreement with proceeding with such. 2/12; patient came in today after his snap VAC lost suction. Brought in to see one of our nurses. The dressing was replaced and then we put the cast back on and rehooked up the snap VAC. Apparently his wound looked very good per our intake nurse. 2/15; again we replaced the cast on Friday. By Saturday the snap VAC and light suction. He called this morning he comes  in acutely. The wounds look fine however the VAC is not functioning. We replaced the cast using silver alginate as the primary dressing backed with Kerramax. The snap VAC was not replaced 12/05/2019 upon evaluation today patient appears to be doing better in regard to his left plantar foot ulcer. Fortunately there is no signs of active infection at this time. Unfortunately he is continuing to have issues with the right  foot he is really not making any progress here things seem to be somewhat stagnant to be honest. The depth has increased but that is due to me having debrided the wound in the past based on what I am seeing. 12/12/2019 on evaluation today patient appears to be doing more poorly in regard to the left lower extremity. He has some erythema spreading up the side of his foot I am concerned about infection again at this point. Unfortunately he has been seeing improvement with a total contact cast but I do not think we should put that on today. On his right plantar foot he continues to have significant drainage this is actually measuring deeper I really do not feel like you are making any progress whatsoever. I have prescribed Granix for him unfortunately his insurance apparently was going to cost him a $500 co-pay. 12/19/2019 upon evaluation today patient actually appears to be doing better in regard to both wounds. With that being said he actually did get the reGranix which he had to pay $500 for. With that being said it does look like that he is actually made some improvement based on what I am seeing at this point with the reGranix. Obviously if he is going to continue this we are going to do something about trying to get him some help in covering the cost. 12/26/2019 on evaluation today patient appears to be doing really much better even compared to last week. Overall the wound seems to be much better even compared to last week and last week was better than the week before. Since has been using the reGranix his symptoms have improved significantly. With that being said the issue right now is simply that this is a very expensive medication for him the first dose cost him $500. Upon inspection patient's wound bed actually is however dramatically improved compared to before he started this 2 weeks ago. 01/02/2020 upon evaluation today patient appears to actually be doing well. He still had a little bit of  reGranix left that has been using in small amounts he just [place], [name] ([id number]) [id number] Page 8 of 12 been applying it every other day instead of every day in order to make it go longer. Overall we are still seeing excellent improvement he is measuring smaller looking better healthier tissue and everything seems to be pointing to this headed in the right direction. Fortunately there is no evidence of infection either which is also excellent news. He does have his MRI coming up within the next week. 01/09/2020 upon evaluation today patient appears to be doing a little worse today compared to previous week's evaluation. He is actually been out of the reGranix at this point. He has been trying to make the stretch out so he has been changing the dressings on a regular set schedule like he was previous. I think this has made a difference. Fortunately there is no signs of active infection at this time. No fevers, chills, nausea, vomiting, or diarrhea. 01/16/2020 upon evaluation today patient appears to be doing well with regard to his left plantar  foot ulcer. The right plantar foot still shows some significant depth at this point. Fortunately there is no signs of active infection at this time. 01/30/2020 upon evaluation today patient appears to be doing about the same in regard to his right plantar foot ulcer there is still some depth here and we had to wait till he actually switched over to his new insurance to get approval for the MRI under his new insurance plan. With that being said he now has switched as of April 1. Fortunately there is no signs of active infection at this time. Overall in regard to his left foot ulcer this seems to be doing much better and I am actually very pleased with how things are going. With that being said it is not quite as much progress as we were seeing with the reGranix but at the same time he has had trouble getting this apparently there is  been some hindrance here. I Georgina Peer try to actually send this to [facility] that was recommended by the drug rep to me. 02/13/20 upon evaluation today patient appears to be doing better in regard to his left foot ulcer this is great news. Unfortunately the right foot ulcer is not really significantly better at this time. There is no signs of active infection systemically though he did have his MRI which showed unfortunately he does have infection noted including an abscess in the foot. There is also marrow changes noted which are consistent with osteomyelitis based on the radiology review and interpretation. Unfortunately considering that the wound is not really making the progress that we will he would like to be seen I think that this is an indication that he may need some further referral both infectious disease as well as potentially to podiatrist to see if there is anything that can be done to help with the situation that were dealing with here. The left foot again is doing great. READMISSION 06/04/2021 This is a 61 year old man who was in the clinic in 2021 followed by Jeri Cos for areas on the right and left foot. He developed a left foot infection and was referred to ID. He left the clinic in a nonhealed state and was followed for a period of [facility] Cannon Kettle. Apparently things really deteriorated in early July when he was admitted to hospital from 04/27/2021 through 05/06/2021 with sepsis secondary to a left foot infection. His blood cultures were negative. An MRI suggested fifth metatarsal osteomyelitis a left ankle septic joint. He was treated with vancomycin and ertapenem which he is still taking and may just about be finishing. He was seen by orthopedics and the patient adamantly refused to BKA. As far as I can tell he did not have the ankle aspirated I am not exactly sure what the issue was here. Since he has been discharged she is at [facility] for  rehabilitation. He was last seen by Dr. Drucilla Schmidt on 05/20/2021 he noted osteo of the tibial talar bone cuboid and fifth metatarsal which is even more extensive than what was suggested by the MRI. He is apparently going for a consultation with orthopedic surgery in [place] sometime next week. I received a call about this man 2 weeks ago from Dr. Gwenlyn Found who follows him for the possibility of PAD. ABIs I think done in the office showed a ABI on the right of 1.05 at the PTA and 0.99 at the PTA on the left. He had a DVT rule out in  the left leg that was negative for the DVT. Past medical history is extensive and includes diastolic heart failure, right first metatarsal head ulcer in 2021, excision of the right second ray by Dr. Cannon Kettle on 03/13/2020, hypertension, hypothyroidism. Left total hip replacement, right total knee replacement, carpal tunnel syndrome, obstructive sleep apnea alcohol abuse with cirrhosis although the patient denies current alcohol intake. The patient does not think he is a diabetic however looking through [facility] I see 2 HgbAic's of this year that were greater than equal to 6.5 which by definition makes him diabetic. Nevertheless he is not on any treatment and does not check his blood sugars. The patient is now back home out of the nursing home. Saw Dr. Drucilla Schmidt last week he was taken off vancomycin and ertapenem on August 22 and now is on doxycycline on Augmentin. He also saw Dr. Buford Dresser who is his orthopedic surgeon in [place] he recommended a [employer]. He has been using Medihoney. 9/6I have been having trouble getting hyperbarics approved through our prior authorization process. Even though he had a limb threatening infection in the left foot and probably the left ankle there glitches in how some of the reports are worded also some of the consultants. In any case I am going to repeat his sedimentation rate and C-reactive protein. I am generally not in favor of doing things  like this as they really do not alter the plan of care from my point of view however I am going to need to demonstrate that these remain high in order to get this through forhyperbaric treatment for chronic refractory osteomyelitis 9/13; following this man for a wound on his left plantar foot in the setting of type 2 diabetes and Charcot deformity. He has underlying chronic refractory osteomyelitis. Follow-up sedimentation rate and C-reactive protein were both elevated but the C-reactive protein was down to 1.4, sedimentation rate at 70. Sedimentation rate was only slightly down from previous at 85. His wound is measuring slightly smaller. 9/20; patient started hyperbaric oxygen therapy today and tolerated treatment well. This is for the underlying osteomyelitis. He remains on antibiotics but thinks he is getting close to finishing. The wound on the plantar aspect of his foot is the other issue we are following here. He is using Medihoney The patient has a Charcot foot in the setting of type 2 diabetes. He is going to need a total contact cast although his partner was away this week and we elected to delay this till next week 9/27; patient still tolerating hyperbaric oxygen well. Wound looked generally healthy not much depth under illumination still 100% covered in fibrinous debris. Raised callused edges around the wound he was prepared for a total contact cast. We have been using Medihoney 9/30; patient is back for his first obligatory total contact cast change. We are using Hydrofera Blue. Noted by our intake nurse to have a lot of drainage or at least a moderate amount of drainage. I am not sure I was previously aware of this 10/4; patient arrives today with a lot of drainage under the cast. When he had it changed last Friday there was also a similar amount of drainage. Her intake nurse says that they tried a wound VAC on him perhaps while I was on vacation in August under the cast but that did not  work. In my experience that has not been unusual. We have been using Hydrofera Blue with all the secondary absorbers. The drainage today went right through all  of our dressings. The patient is concerned about his foot being in a cast without much drainage. He is tolerating HBO well. There has been improvements in the wound in the mid part of his foot in the setting of a Charcot deformity 10/7; patient presents for cast change. He has no issues or complaints today. He denies signs of infection. 10/11; patient presents for cast change. At this time he would like to take a break from the cast. He would like to do daily dressing changes with Hydrofera Blue. He currently denies signs of infection. 10/14; his cast was taken off last week at his request. He arrives in the clinic with [facility]. He has been changing the dressing himself. He has way too much edema in the left foot and leg to consider a total contact cast. I do not really know the issue here. He does have chronic venous insufficiency His wife stopped me in the clinic earlier in the week to report he is drinking again and she is concerned. I am uncertain whether there are other issues 10/18; he arrived in clinic last week having bilateral lower extremity edema likely secondary to chronic venous insufficiency there was too much edema in the left leg to apply a total contact cast. I put him in compression on the left leg to control the swelling. This week he cut the wrap on the foot for reasons that are unclear however today he arrives in clinic with a smaller left leg but massive edema in the left foot. He is supposed to be wearing a juxta lite stocking on the right leg but he is not wearing the contact layer. His attendance at hyperbaric oxygen has been dwindling, he did not dive yesterday and he did not dive today concerned about hyperbarics causing swelling. I looked back in his record his last echo was in 2020 essentially normal left and  right ventricular function. Last BUN and creatinine were done in July this was normal. He is having a lot of drainage in the left plantar foot wound 10/25; again he comes in today having missed HBO yesterday. Macerated skin around the wound which really does not look very good at all. A lot of edema in the left foot but an improvement in edema on the left leg. We wrapped him last week because of the amount of swelling in the left leg. We could not apply a total contact cast. The patient states that he wants to be able to change his dressing himself. I might consider this if he had stockings to control the swelling. [name], [name] ([id number]) [id number] Page 9 of 12 He said he be here for HBO tomorrow. I will check the degree of erythema in his forefoot which I have marked. He is on doxycycline and Augmentin which was renewed by Dr. Drucilla Schmidt but I cannot see a follow-up note, follow-up inflammatory markers etc. I 1 point he said he was going to see his orthopedic surgeon in [place] I am not sure if he ever did this. I do not know why he has not followed up with infectious disease, he says he was not given an appointment but he is still on Augmentin doxycycline 11/1; he did not do the lab work I ordered last week. Still on Augmentin and doxycycline. Silver alginate and he is changing this daily using his own juxta lite stocking. The surface of the wound does not look too bad. No real epithelialization however. The patient was seen today along with HBO  08/26/2021 upon evaluation today patient is being seen at his request by myself he wanted to transfer care to see me. With that being said he has been seeing Dr. Dellia Nims since he came back in August of this year. Unfortunately he tells me he still having quite a bit of drainage. He is also significant erythema and warmth noted of the foot as well. He has been hit or miss with regard to hyperbaric oxygen therapy tells me that at  this point he took this week off because he was extremely claustrophobic and having a lot of issues he plans to start back next week. Nonetheless he has been given some medication by Dr. Dellia Nims as well to help calm things down which again may help him. Hopefully he can get back in hyperbarics as I think this is likely necessary. Nonetheless there does seem to be evidence of cellulitis noted today as well and in general I am a little concerned in that regard. His wound unfortunately is significant on this left foot and is right where he takes the brunt of the force secondary to the Charcot arthropathy. I do not think a total contact cast is ideal due to the fact that he unfortunately is draining much too significantly he is also not happy with the idea of using a cast therefore he states he would not want to do that anyway. 09/16/2021 upon evaluation today patient's plantar foot ulcer unfortunately continues to show signs of issues here. He has been in the hospital due to trying to detox himself from alcohol he had withdrawal symptoms and subsequently was hospitalized. During that time it was recommended by both orthopedics as well as infectious disease apparently that he proceed with amputation. With that being said they discussed with him the risk of not doing so. This is well- documented in the encounter. With that being said the patient does not want to proceed down that road and tells me that he declined their advice in that regard. Subsequently our goal then is to try to do what we can to try to get this thing healed and closed. I think this means he is getting need to stay off of it is much as possible he does have a wheelchair at home that he tells me he can use I think that that is going to be something that he does need to do. 09/23/2021 upon evaluation today patient continues to have significant issues here with his foot ulcer which is plantar on the left foot. Subsequently again he does have a  history of Charcot foot he also has an issue of having had osteomyelitis as well as an open wound for some time here. Its been recommended multiple times for him to have an amputation although that is not something that he is really interested or wanting to do. 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 Subsequently this means that he also has a gait abnormality that is causing issues with his walking and causing abnormal pressures in the central part of his foot this is the reason he has a wound. Nonetheless I want to see what I can do about trying to get a boot to help with stabilization and offloading of working to see what we can do in that regard. 09/30/2021 upon evaluation today patient appears to be doing well with regard to his plantar foot ulcer. He still has significant granular hypergranulation but again this is much less than what it  was even last time I saw him last week. Fortunately I think we are headed in the right direction. This is definitely something we could consider a skin graft or something along those lines if we can get it flattened out enough and get the drainage from being so significant. I feel like we are making some headway here. 10/07/2021 upon evaluation today patient appears to be doing decently well in regard to his wound. Fortunately there does not appear to be any signs of infection overall I feel like it is actually showing some signs of improvement. He still has some hypergranulation but this is much less than its been. 10/21/2021 upon evaluation today patient appears to be doing well with regard to his wound is actually showing some signs of improvement which is great. We have been doing the chemical cauterization with silver nitrate as well as debridement as needed and again has been using silver alginate up to this point. With that being said he does have some Hydrofera Blue at home he wonders if that can be  beneficial at this point. 11/25/2021 upon evaluation today patient appears to be doing somewhat poorly in regard to his plantar foot ulcer. He is also been having some issues with his congestive heart failure. He has recently been in the hospital. I did review those notes as well and apparently his heart failure has been somewhat out of control. He is actually being managed as an outpatient in this regard but nonetheless this is something that can still be kept a close eye on according to the notes and what I see. Fortunately I do not see any signs of active infection at this time which is great news. Nonetheless I am concerned about the boggy central portion of the wound which I think is going to likely open up in the near future. Readmission: 01-27-2022 patient presents for follow-up here in the clinic today. His last been November 25, 2021 since have seen him. At that time we just ordered an MRI fortunately the MRI did not show any obvious signs of osteomyelitis and there were some reactive changes which were questionable and could not be excluded. With that being said in the interim since have seen him back on February 8 things have gotten worse from the standpoint of how the wound appears today. I do think that he is going to require potentially more debridement to clean this wound out than what I can even offer here in the clinic there is a tremendous amount of hypergranulation tissue which is not sufficient to grow new tissue over and honestly I think this is going to lead to a delay in healing if its not taking care of. I need to see if I can get him into see a surgeon to get an opinion on whether or not they could take him to the OR for an aggressive surgical debridement to clear out this hypergranulation tissue and achieve hemostasis which is can be the biggest issue with me here in the clinic as he does tend to bleed even with very light superficial debridements. The patient's not opposed to  this and he states that if I have someone that like for him to see he would be happy to go. Otherwise his medical history really has not changed. He does tell me he is getting ready to go for an evaluation for CPAP machine. Readmission 05/04/2022 Patient was last seen 3 months ago. He states he went to rehab for alcohol abuse. He continues  to have a wound on the plantar aspect of the left foot. He states this has never completely healed over the past several years. He is following with podiatry for this issue currently and they are recommending blast X with collagen and a cam boot. He has not started this yet. He has home health. Currently he is using silver alginate. He also follows with Dr. Algis Liming infectious disease who ordered an MRI of the foot as he is concerned for osteomyelitis. CRP and sed rate are elevated. He is currently on Augmentin for prophylaxis as he has a history of osteomyelitis to this foot. Infectious disease has recommended amputation. Currently patient denies systemic signs of infection. 8/7; patient is following up with podiatry on 8/11. He wanted to come in to be debrided. He has no issues or complaints today. He has been using silver alginate to the wound bed. He has home health that helps change the dressings. He had an MRI completed on 7/18 that did not show evidence of osteomyelitis. He is scheduled to see infectious disease on 8/16. 8/25; Patient is following with podiatry for his wound care and he is currently using silver alginate and a defender boot for his Left foot wound. He would like to have debridement to the wound but is unable to see podiatry until the end of the month. He has asked if we could debride him. 9/21; patient follows with podiatry for his foot wound but likes to come into our clinic for debridements. He was last seen on 06/24/2022 by Dr. Allena Katz, podiatry. He is currently using blast X to the wound bed. He has been using a Psychologist, forensic. He is not  scheduled to see podiatry until 10/11 and comes in for debridement today. 10/9; patient presents for follow-up. He states he is no longer following with podiatry. He has been doing silver alginate to the wound bed. He states he would like to attempt a total contact cast but starting next week. He currently denies systemic signs of infection. 10/16; patient presents for follow-up. He has been using silver alginate to the wound bed. We discussed having the cast placed today and patient would like to proceed with this. He has follow-up in 2 days for his cast exchange. He currently denies signs of infection. 10/18; total contact cast placed 2 days ago. He comes back in for the obligatory total contact cast change HOWEVER our intake nurse noted copious drainage soaking right through the dressings and asked me to look at this. Indeed there was a large amount of drainage soaking right through all of the dressings that were applied. I cannot imagine that this would last 7 days. Even if we change the cast twice a week I would be concerned. We worked on this patient through the late part of 2022 in the early 2023 for this same wound. We attempted a cast at that time I think even a cast with a wound VAC and for 1 reason or another we could never make this progress towards closure. Fortunately his recent MRI did not show osteomyelitis. [name], [name] ([id number]) [id number] Page 10 of 12 10/26; patient presents for follow-up. He has been using silver alginate to the wound bed. He has no issues or complaints today. 11/3; wound edges are macerated secondary to copious drainage. PCR culture came back showing MRSA this has been sent to [facility] but we do not yet have that available.. Nursing reports still copious drainage Objective Constitutional Patient is hypertensive.. Pulse regular and within target  range for patient.Marland Kitchen. Respirations regular, non-labored and within target range..  Temperature is normal and within the target range for the patient.Marland Kitchen. Appears in no distress. Vitals Time Taken: 11:45 AM, Height: 73 in, Weight: 237 lbs, BMI: 31.3, Temperature: 98.1 F, Pulse: 93 bpm, Respiratory Rate: 20 breaths/min, Blood Pressure: 167/101 mmHg. General Notes: Wound exam; plantar aspect midfoot left. I used a #5 curette to knock down callus and macerated skin from the wound edge and fibrinous debris from the wound surface. Hemostasis with silver nitrate and a pressure dressing. There is still undermining here especially laterally Integumentary (Hair, Skin) Wound #5 status is Open. Original cause of wound was Gradually Appeared. The date acquired was: 10/19/2019. The wound has been in treatment 15 weeks. The wound is located on the Left,Plantar Foot. The wound measures 3.3cm length x 2.8cm width x 0.5cm depth; 7.257cm^2 area and 3.629cm^3 volume. There is Fat Layer (Subcutaneous Tissue) exposed. There is no tunneling or undermining noted. There is a medium amount of serosanguineous drainage noted. The wound margin is thickened. There is large (67-100%) red, pink granulation within the wound bed. There is a small (1-33%) amount of necrotic tissue within the wound bed including Adherent Slough. The periwound skin appearance exhibited: Callus. The periwound skin appearance did not exhibit: Crepitus, Excoriation, Induration, Rash, Scarring, Dry/Scaly, Maceration, Atrophie Blanche, Cyanosis, Ecchymosis, Hemosiderin Staining, Mottled, Pallor, Rubor, Erythema. Periwound temperature was noted as No Abnormality. Assessment Active Problems ICD-10 Type 2 diabetes mellitus with foot ulcer Non-pressure chronic ulcer of other part of left foot with fat layer exposed Chronic multifocal osteomyelitis, left ankle and foot Charcot's joint, left ankle and foot Procedures Wound #5 Pre-procedure diagnosis of Wound #5 is a Diabetic Wound/Ulcer of the Lower Extremity located on the Left,Plantar Foot  .Severity of Tissue Pre Debridement is: Fat layer exposed. There was a Excisional Skin/Subcutaneous Tissue Debridement with a total area of 9.24 sq cm performed by Maxwell Caulobson, [place] Mikami G., MD. With the following instrument(s): Curette to remove Viable and Non-Viable tissue/material. Material removed includes Callus, Subcutaneous Tissue, Slough, Skin: Dermis, and Skin: Epidermis after achieving pain control using Lidocaine 5% topical ointment. A time out was conducted at 12:05, prior to the start of the procedure. A Moderate amount of bleeding was controlled with Pressure. The procedure was tolerated well with a pain level of 0 throughout and a pain level of 0 following the procedure. Post Debridement Measurements: 3.3cm length x 2.8cm width x 0.5cm depth; 3.629cm^3 volume. Character of Wound/Ulcer Post Debridement is improved. Severity of Tissue Post Debridement is: Fat layer exposed. Post procedure Diagnosis Wound #5: Same as Pre-Procedure Plan Follow-up Appointments: Return Appointment in 2 weeks. - Dr. Mikey BussingHoffman Other: - [facility] when arrives and also bring into clinic at appt times. Anesthetic: (In clinic) Topical Lidocaine 5% applied to wound bed (In clinic) Topical Lidocaine 4% applied to wound bed Cellular or Tissue Based Products: Cellular or Tissue Based Product Type: - Run IVR for Grafix=20% Copay Bathing/ Shower/ Hygiene: May shower with protection but do not get wound dressing(s) wet. - May use cast protector bag from [facility], [employer] or [facility] Edema Control - Lymphedema / SCD / Other: Elevate legs to the level of the heart or above for 30 minutes daily and/or when sitting, a frequency of: - throughout the day Avoid standing for long periods of time. Fanny BienMILLER, Devron ([id number]) [id number] Page 11 of 12 Exercise regularly Moisturize legs daily. Compression stocking or Garment 30-40 mm/Hg pressure to: - wear the  Juxtalite HD to right and left leg. apply in the morning and remove at night. Off-Loading: Other: - Defender Boot: minimal weight bearing left foot use wheelchair for mobility. Additional Orders / Instructions: Follow Nutritious Diet WOUND #5: - Foot Wound Laterality: Plantar, Left Cleanser: Wound Cleanser 1 x Per Day/30 Days Discharge Instructions: Cleanse the wound with wound cleanser prior to applying a clean dressing using gauze sponges, not tissue or cotton balls. Topical: Mupirocin Ointment 1 x Per Day/30 Days Discharge Instructions: Apply Mupirocin in clinic only. Topical: blastx 1 x Per Day/30 Days Discharge Instructions: please apply blastx daily until the topical antibiotics arrives. Topical: topical antibiotics 1 x Per Day/30 Days Discharge Instructions: once arrives stop blastx and use topical antibiotics from [facility]. Prim Dressing: Maxorb Extra Calcium Alginate Dressing, 4x4 in 1 x Per Day/30 Days ary Discharge Instructions: Apply calcium alginate to wound bed as instructed Secondary Dressing: ABD Pad, 5x9 ([place]) 1 x Per Day/30 Days Discharge Instructions: Apply over primary dressing as directed. Secondary Dressing: Drawtex 4x4 in 1 x Per Day/30 Days Discharge Instructions: cut to into wound bed over the alginate. Secured With: [employer], 4.5x3.1 (in/yd) 1 x Per Day/30 Days Discharge Instructions: Secure with Kerlix as directed. Secured With: 31M Medipore H Soft Cloth Surgical T ape, 4 x 10 (in/yd) 1 x Per Day/30 Days Discharge Instructions: Secure with tape as directed. Left plantar foot. This is a very difficult wound with copious drainage. Culture showed MRSA and we reviewed this. However there is no evidence of soft tissue infection. The culture has been sent to [facility] but we have not yet received topical antibiotics. The patient has blast X at home and we will use this until we have a [facility] option. Debridement as noted. Unfortunately I  think debridements are going to be necessary going forward at least for the next 2 or 3 weeks to get to some sort of viable tissue. He is not a candidate for a TCC based on copious drainage. Hopefully antibiotic therapy would help with this No need for systemic antibiotics Electronic Signature(s) Signed: 08/20/2022 12:29:37 PM By: Linton Ham MD Entered By: Linton Ham on 08/20/2022 12:27:17 -------------------------------------------------------------------------------- SuperBill Details Patient Name: Date of Service: [name] IG 08/20/2022 Medical Record Number: [id number] Patient Account Number: [id number] Date of Birth/Sex: Treating RN: [birth year] (61 y.o. [name] Primary Care Provider: Billey Gosling Other Clinician: Referring Provider: Treating Provider/Extender: Genella Rife in Treatment: 15 Diagnosis Coding ICD-10 Codes Code Description E11.621 Type 2 diabetes mellitus with foot ulcer L97.522 Non-pressure chronic ulcer of other part of left foot with fat layer exposed M86.372 Chronic multifocal osteomyelitis, left ankle and foot M14.672 Charcot's joint, left ankle and foot Facility Procedures : CPT4 Code: [id number] Description: [id number] - DEB SUBQ TISSUE 20 SQ CM/< ICD-10 Diagnosis Description L97.522 Non-pressure chronic ulcer of other part of left foot with fat layer exposed Modifier: Quantity: 1 Physician Procedures : CPT4 Code Description [name], Walden ([id number]) [id number] - WC PHYS SUBQ TISS 20 SQ CM 1 ICD-10 Diagnosis Description L97.522 Non-pressure chronic ulcer of other part of left foot with fat layer  exposed Quantity: Page 12 of 12 Electronic Signature(s) Signed: 08/20/2022 12:29:37 PM By: Linton Ham MD Entered By: Linton Ham on 08/20/2022 12:27:30

## 2022-08-21 NOTE — Progress Notes (Signed)
JAH, ALARID ([id number]) [id number] Page 1 of 8 Visit Report for 08/20/2022 Arrival Information Details Patient Name: Date of Service: [name], [name] 08/20/2022 11:15 A M Medical Record Number: [id number] Patient Account Number: [id number] Date of Birth/Sex: Treating RN: 04/20/1961 (61 y.o. M) Primary Care Pola Furno: Billey Gosling Other Clinician: Referring Naasir Carreira: Treating Genowefa Morga/Extender: Genella Rife in Treatment: 15 Visit Information History Since Last Visit All ordered tests and consults were completed: No Patient Arrived: Ambulatory Added or deleted any medications: No Arrival Time: 11:46 Any new allergies or adverse reactions: No Accompanied By: self Had a fall or experienced change in No Transfer Assistance: None activities of daily living that may affect Patient Identification Verified: Yes risk of falls: Secondary Verification Process Completed: Yes Signs or symptoms of abuse/neglect since last visito No Patient Requires Transmission-Based Precautions: No Hospitalized since last visit: No Patient Has Alerts: No Implantable device outside of the clinic excluding No cellular tissue based products placed in the center since last visit: Pain Present Now: No Electronic Signature(s) Signed: 08/20/2022 12:02:27 PM By: Worthy Rancher Entered By: Worthy Rancher on 08/20/2022 11:47:12 -------------------------------------------------------------------------------- Encounter Discharge Information Details Patient Name: Date of Service: [name] 08/20/2022 11:15 A M Medical Record Number: [id number] Patient Account Number: [id number] Date of Birth/Sex: Treating RN: [birth year] (61 y.o. [name] Primary Care Rocco Kerkhoff: Billey Gosling Other Clinician: Referring Brody Kump: Treating Keshon Markovitz/Extender: Genella Rife in Treatment: 15 Encounter Discharge Information Items Post Procedure Vitals Discharge  Condition: Stable Temperature (F): 98.1 Ambulatory Status: Cane Pulse (bpm): 93 Discharge Destination: Home Respiratory Rate (breaths/min): 20 Transportation: Private Auto Blood Pressure (mmHg): 167/101 Accompanied By: self Schedule Follow-up Appointment: Yes Clinical Summary of Care: Electronic Signature(s) Signed: 08/20/2022 5:25:29 PM By: Deon Pilling RN, BSN Entered By: Deon Pilling on 08/20/2022 12:18:13 Dona Ana, [name] ([id number]) [id number] Page 2 of 8 -------------------------------------------------------------------------------- Lower Extremity Assessment Details Patient Name: Date of Service: [name], [name] 08/20/2022 11:15 A M Medical Record Number: [id number] Patient Account Number: [id number] Date of Birth/Sex: Treating RN: 05-27-1961 (61 y.o. [name] Primary Care Aaban Griep: Billey Gosling Other Clinician: Referring Kashawn Dirr: Treating Anija Brickner/Extender: Genella Rife in Treatment: 15 Edema Assessment Assessed: Shirlyn Goltz: Yes] [name]: No] Edema: [Left: N] [Right: o] Calf Left: Right: Point of Measurement: 37 cm From Medial Instep 38.5 cm Ankle Left: Right: Point of Measurement: 11 cm From Medial Instep 24 cm Vascular Assessment Pulses: Dorsalis Pedis Palpable: [Left:Yes] Electronic Signature(s) Signed: 08/20/2022 5:25:29 PM By: Deon Pilling RN, BSN Entered By: Deon Pilling on 08/20/2022 11:54:52 -------------------------------------------------------------------------------- Multi Wound Chart Details Patient Name: Date of Service: [name] 08/20/2022 11:15 A M Medical Record Number: [id number] Patient Account Number: [id number] Date of Birth/Sex: Treating RN: 11-25-1960 (61 y.o. M) Primary Care Brody Kump: Billey Gosling Other Clinician: Referring [name] Steedman: Treating Kristia Jupiter/Extender: Genella Rife in Treatment: 15 Vital Signs Height(in): 73 Pulse(bpm): 93 Weight(lbs): 237 Blood  Pressure(mmHg): 167/101 Body Mass Index(BMI): 31.3 Temperature(F): 98.1 Respiratory Rate(breaths/min): 20 [5:Photos:] [N/A:N/A] Left, Plantar Foot N/A N/A Wound Location: Gradually Appeared N/A N/A Wounding Event: Diabetic Wound/Ulcer of the Lower N/A N/A Primary Etiology: Extremity Anemia, Sleep Apnea, Congestive N/A N/A Comorbid History: Heart Failure, Hypertension, Peripheral Venous Disease, Cirrhosis , Type II Diabetes, Osteoarthritis, Osteomyelitis, Neuropathy 10/19/2019 N/A N/A Date Acquired: 15 N/A N/A Weeks of Treatment: Open N/A N/A Wound Status: No N/A N/A Wound Recurrence: 3.3x2.8x0.5 N/A N/A Measurements L x W x D (cm) 7.257 N/A N/A A (cm) : rea 3.629 N/A N/A Volume (cm) : -  74.70% N/A N/A % Reduction in A rea: -774.50% N/A N/A % Reduction in Volume: Grade 2 N/A N/A Classification: Medium N/A N/A Exudate A mount: Serosanguineous N/A N/A Exudate Type: red, brown N/A N/A Exudate Color: Thickened N/A N/A Wound Margin: Large (67-100%) N/A N/A Granulation A mount: Red, Pink N/A N/A Granulation Quality: Small (1-33%) N/A N/A Necrotic A mount: Fat Layer (Subcutaneous Tissue): Yes N/A N/A Exposed Structures: Fascia: No Tendon: No Muscle: No Joint: No Bone: No None N/A N/A Epithelialization: Debridement - Excisional N/A N/A Debridement: Pre-procedure Verification/Time Out 12:05 N/A N/A Taken: Lidocaine 5% topical ointment N/A N/A Pain Control: Callus, Subcutaneous, Slough N/A N/A Tissue Debrided: Skin/Subcutaneous Tissue N/A N/A Level: 9.24 N/A N/A Debridement A (sq cm): rea Curette N/A N/A Instrument: Moderate N/A N/A Bleeding: Pressure N/A N/A Hemostasis A chieved: 0 N/A N/A Procedural Pain: 0 N/A N/A Post Procedural Pain: Procedure was tolerated well N/A N/A Debridement Treatment Response: 3.3x2.8x0.5 N/A N/A Post Debridement Measurements L x W x D (cm) 3.629 N/A N/A Post Debridement Volume: (cm) Callus: Yes N/A  N/A Periwound Skin Texture: Excoriation: No Induration: No Crepitus: No Rash: No Scarring: No Maceration: No N/A N/A Periwound Skin Moisture: Dry/Scaly: No Atrophie Blanche: No N/A N/A Periwound Skin Color: Cyanosis: No Ecchymosis: No Erythema: No Hemosiderin Staining: No Mottled: No Pallor: No Rubor: No No Abnormality N/A N/A Temperature: Debridement N/A N/A Procedures Performed: Treatment Notes Wound #5 (Foot) Wound Laterality: Plantar, Left Cleanser Wound Cleanser Discharge Instruction: Cleanse the wound with wound cleanser prior to applying a clean dressing using gauze sponges, not tissue or cotton balls. Peri-Wound Care Topical Mupirocin Ointment Discharge Instruction: Apply Mupirocin in clinic only. blastx Discharge Instruction: please apply blastx daily until the topical antibiotics arrives. NATHON, STEFANSKI ([id number]) [id number] Page 4 of 8 topical antibiotics Discharge Instruction: once arrives stop blastx and use topical antibiotics from [facility]. Primary Dressing Maxorb Extra Calcium Alginate Dressing, 4x4 in Discharge Instruction: Apply calcium alginate to wound bed as instructed Secondary Dressing ABD Pad, 5x9 Discharge Instruction: Apply over primary dressing as directed. Drawtex 4x4 in Discharge Instruction: cut to into wound bed over the alginate. Secured With [employer], 4.5x3.1 (in/yd) Discharge Instruction: Secure with Kerlix as directed. 58M Medipore H Soft Cloth Surgical T ape, 4 x 10 (in/yd) Discharge Instruction: Secure with tape as directed. Compression Wrap Compression Stockings Add-Ons Electronic Signature(s) Signed: 08/20/2022 12:29:37 PM By: Linton Ham MD Entered By: Linton Ham on 08/20/2022 12:23:41 -------------------------------------------------------------------------------- Multi-Disciplinary Care Plan Details Patient Name: Date of Service: DARYAN, BUELL [name] 08/20/2022 11:15 A  M Medical Record Number: [id number] Patient Account Number: [id number] Date of Birth/Sex: Treating RN: December 10, 1960 (61 y.o. [name] Primary Care Leshaun Biebel: Billey Gosling Other Clinician: Referring Jaquell Seddon: Treating Quantavis Obryant/Extender: Genella Rife in Treatment: 15 Active Inactive Necrotic Tissue Nursing Diagnoses: Impaired tissue integrity related to necrotic/devitalized tissue Goals: Necrotic/devitalized tissue will be minimized in the wound bed Date Initiated: 06/11/2022 Target Resolution Date: 09/17/2022 Goal Status: Active Patient/caregiver will verbalize understanding of reason and process for debridement of necrotic tissue Date Initiated: 06/11/2022 Date Inactivated: 08/20/2022 Target Resolution Date: 08/20/2022 Goal Status: Met Interventions: Assess patient pain level pre-, during and post procedure and prior to discharge Treatment Activities: Apply topical anesthetic as ordered : 06/11/2022 Excisional debridement : 06/11/2022 Notes: pale pink tissue- debridement performed by Asyah Candler. MRSA culture Shiquita Collignon order topical antibiotics. Electronic Signature(s) Renick, Snyder ([id number]) [id number] Page 5 of 8 Signed: 08/20/2022 5:25:29 PM By: Deon Pilling RN, BSN Entered By: Deon Pilling on  08/20/2022 12:11:05 -------------------------------------------------------------------------------- Pain Assessment Details Patient Name: Date of Service: [name], [name] 08/20/2022 11:15 A M Medical Record Number: [id number] Patient Account Number: [id number] Date of Birth/Sex: Treating RN: 1961/09/01 (61 y.o. M) Primary Care Hanya Guerin: Billey Gosling Other Clinician: Referring Jaleah Lefevre: Treating Meryl Hubers/Extender: Genella Rife in Treatment: 15 Active Problems Location of Pain Severity and Description of Pain Patient Has Paino No Site Locations Pain Management and Medication Current Pain Management: Electronic  Signature(s) Signed: 08/20/2022 12:02:27 PM By: Worthy Rancher Entered By: Worthy Rancher on 08/20/2022 11:47:46 -------------------------------------------------------------------------------- Patient/Caregiver Education Details Patient Name: Date of Service: [name] 11/3/2023andnbsp11:15 A M Medical Record Number: [id number] Patient Account Number: [id number] Date of Birth/Gender: Treating RN: Mar 02, 1961 (61 y.o. [name] Primary Care Physician: Billey Gosling Other Clinician: Referring Physician: Treating Physician/Extender: Genella Rife in Treatment: 15 Education Assessment Education Provided To: Patient PRATHIK, AMAN ([id number]) [id number] Page 6 of 8 Education Topics Provided Wound/Skin Impairment: Handouts: Skin Care Do's and Dont's Methods: Explain/Verbal Responses: Reinforcements needed Electronic Signature(s) Signed: 08/20/2022 5:25:29 PM By: Deon Pilling RN, BSN Entered By: Deon Pilling on 08/20/2022 12:11:17 -------------------------------------------------------------------------------- Wound Assessment Details Patient Name: Date of Service: [name] 08/20/2022 11:15 A M Medical Record Number: [id number] Patient Account Number: [id number] Date of Birth/Sex: Treating RN: 08-19-1961 (61 y.o. M) Primary Care Erbie Arment: Billey Gosling Other Clinician: Referring Lea Baine: Treating Avrey Flanagin/Extender: Genella Rife in Treatment: 15 Wound Status Wound Number: 5 Primary Diabetic Wound/Ulcer of the Lower Extremity Etiology: Wound Location: Left, Plantar Foot Wound Open Wounding Event: Gradually Appeared Status: Date Acquired: 10/19/2019 Comorbid Anemia, Sleep Apnea, Congestive Heart Failure, Hypertension, Weeks Of Treatment: 15 History: Peripheral Venous Disease, Cirrhosis , Type II Diabetes, Clustered Wound: No Osteoarthritis, Osteomyelitis, Neuropathy Photos Wound Measurements Length:  (cm) 3.3 Width: (cm) 2.8 Depth: (cm) 0.5 Area: (cm) 7.257 Volume: (cm) 3.629 % Reduction in Area: -74.7% % Reduction in Volume: -774.5% Epithelialization: None Tunneling: No Undermining: No Wound Description Classification: Grade 2 Wound Margin: Thickened Exudate Amount: Medium Exudate Type: Serosanguineous Exudate Color: red, brown Foul Odor After Cleansing: No Slough/Fibrino Yes Wound Bed Granulation Amount: Large (67-100%) Exposed Structure Granulation Quality: Red, Pink Fascia Exposed: No Necrotic Amount: Small (1-33%) Fat Layer (Subcutaneous Tissue) Exposed: Yes Necrotic Quality: Adherent Slough Tendon Exposed: No Muscle Exposed: No Joint Exposed: No Bone Exposed: No [name], [name] ([id number]) [id number] Page 7 of 8 Periwound Skin Texture Texture Color No Abnormalities Noted: No No Abnormalities Noted: No Callus: Yes Atrophie Blanche: No Crepitus: No Cyanosis: No Excoriation: No Ecchymosis: No Induration: No Erythema: No Rash: No Hemosiderin Staining: No Scarring: No Mottled: No Pallor: No Moisture Rubor: No No Abnormalities Noted: No Dry / Scaly: No Temperature / Pain Maceration: No Temperature: No Abnormality Treatment Notes Wound #5 (Foot) Wound Laterality: Plantar, Left Cleanser Wound Cleanser Discharge Instruction: Cleanse the wound with wound cleanser prior to applying a clean dressing using gauze sponges, not tissue or cotton balls. Peri-Wound Care Topical Mupirocin Ointment Discharge Instruction: Apply Mupirocin in clinic only. blastx Discharge Instruction: please apply blastx daily until the topical antibiotics arrives. topical antibiotics Discharge Instruction: once arrives stop blastx and use topical antibiotics from [facility]. Primary Dressing Maxorb Extra Calcium Alginate Dressing, 4x4 in Discharge Instruction: Apply calcium alginate to wound bed as instructed Secondary Dressing ABD Pad,  5x9 Discharge Instruction: Apply over primary dressing as directed. Drawtex 4x4 in Discharge Instruction: cut to into wound bed over the alginate. Secured With [employer], 4.5x3.1 (in/yd) Discharge Instruction: Secure  with Kerlix as directed. 49M Medipore H Soft Cloth Surgical T ape, 4 x 10 (in/yd) Discharge Instruction: Secure with tape as directed. Compression Wrap Compression Stockings Add-Ons Electronic Signature(s) Signed: 08/20/2022 5:25:29 PM By: Deon Pilling RN, BSN Entered By: Deon Pilling on 08/20/2022 11:55:58 -------------------------------------------------------------------------------- Vitals Details Patient Name: Date of Service: [name] 08/20/2022 11:15 A M Medical Record Number: [id number] Patient Account Number: [id number] Date of Birth/Sex: Treating RN: 07/10/1961 (61 y.o. M) Primary Care Terri Malerba: Billey Gosling Other Clinician: Referring Sabirin Baray: Treating Tyeasha Ebbs/Extender: [name], [name], [name] ([id number]) [id number] Page 8 of 8 Weeks in Treatment: 15 Vital Signs Time Taken: 11:45 Temperature (F): 98.1 Height (in): 73 Pulse (bpm): 93 Weight (lbs): 237 Respiratory Rate (breaths/min): 20 Body Mass Index (BMI): 31.3 Blood Pressure (mmHg): 167/101 Reference Range: 80 - 120 mg / dl Electronic Signature(s) Signed: 08/20/2022 12:02:27 PM By: Worthy Rancher Entered By: Worthy Rancher on 08/20/2022 11:47:38

## 2022-08-24 ENCOUNTER — Telehealth: Payer: Self-pay | Admitting: *Deleted

## 2022-08-24 ENCOUNTER — Encounter: Payer: Self-pay | Admitting: Internal Medicine

## 2022-08-24 DIAGNOSIS — M25531 Pain in right wrist: Secondary | ICD-10-CM

## 2022-08-24 NOTE — Telephone Encounter (Signed)
Called Brad with [employer] to request access to the patient's [employer] account.

## 2022-08-25 ENCOUNTER — Ambulatory Visit (INDEPENDENT_AMBULATORY_CARE_PROVIDER_SITE_OTHER): Payer: Medicare Other | Admitting: Adult Health

## 2022-08-25 ENCOUNTER — Encounter: Payer: Self-pay | Admitting: Adult Health

## 2022-08-25 VITALS — BP 126/72 | HR 88 | Temp 97.8°F | Ht 72.0 in | Wt 254.6 lb

## 2022-08-25 DIAGNOSIS — G4733 Obstructive sleep apnea (adult) (pediatric): Secondary | ICD-10-CM
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
## 2022-08-25 NOTE — Assessment & Plan Note (Signed)
Severe obstructive sleep apnea with improved control and compliance on CPAP.  Helpful hints discussed about CPAP.  We will change to a DreamWear nasal mask.  Add in saline nasal gel at bedtime.  Patient has difficulty tolerating full facemask.  He has a full beard and this causes lots of air leaks with his CPAP mask.  We will try a nasal mask if still having difficulties could consider a chinstrap.  If continues to have ongoing issues could consider assessment for inspire device.  Plan  Patient Instructions  Wear CPAP at bedtime, wear all night long for at least 6hrs or more  Work on healthy sleep regimen  Do not drive if sleepy  Work on healthy weight loss.  Order for dream wear nasal mask  Saline nasal gel At bedtime   Follow up in 4 months and As needed

## 2022-08-25 NOTE — Patient Instructions (Addendum)
Wear CPAP at bedtime, wear all night long for at least 6hrs or more  Work on healthy sleep regimen  Do not drive if sleepy  Work on healthy weight loss.  Order for dream wear nasal mask  Saline nasal gel At bedtime   Follow up in 4 months with Dr. Wynona Neat or Burnie Hank NP   and As needed

## 2022-08-28 NOTE — Addendum Note (Signed)
Addended by: [name] on: 08/28/2022 05:04 PM   Modules accepted: Orders

## 2022-09-01 NOTE — Addendum Note (Signed)
Addended by: [name] on: 09/01/2022 09:42 PM   Modules accepted: Orders

## 2022-09-02 ENCOUNTER — Ambulatory Visit (INDEPENDENT_AMBULATORY_CARE_PROVIDER_SITE_OTHER): Payer: Medicare Other | Admitting: Orthopaedic Surgery

## 2022-09-02 DIAGNOSIS — M25531 Pain in right wrist: Secondary | ICD-10-CM

## 2022-09-02 DIAGNOSIS — M25532 Pain in left wrist: Secondary | ICD-10-CM

## 2022-09-02 MED ORDER — METHYLPREDNISOLONE ACETATE 40 MG/ML IJ SUSP
40.0000 mg | INTRAMUSCULAR | Status: AC | PRN
  Administered 2022-09-02: 40 mg via INTRA_ARTICULAR

## 2022-09-02 MED ORDER — LIDOCAINE HCL 1 % IJ SOLN
1.0000 mL | INTRAMUSCULAR | Status: AC | PRN
  Administered 2022-09-02: 1 mL

## 2022-09-02 MED ORDER — BUPIVACAINE HCL 0.5 % IJ SOLN
1.0000 mL | INTRAMUSCULAR | Status: AC | PRN
  Administered 2022-09-02: 1 mL via INTRA_ARTICULAR
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
## 2022-09-03 ENCOUNTER — Encounter (HOSPITAL_BASED_OUTPATIENT_CLINIC_OR_DEPARTMENT_OTHER): Payer: Medicare Other | Admitting: Internal Medicine

## 2022-09-03 DIAGNOSIS — L97522 Non-pressure chronic ulcer of other part of left foot with fat layer exposed: Secondary | ICD-10-CM | POA: Diagnosis not present

## 2022-09-03 DIAGNOSIS — E11621 Type 2 diabetes mellitus with foot ulcer: Secondary | ICD-10-CM | POA: Diagnosis not present

## 2022-09-06 NOTE — Progress Notes (Signed)
EDAN, JUDAY ([id number]) [id number] Page 1 of 8 Visit Report for 09/03/2022 Arrival Information Details Patient Name: Date of Service: [name], [name] 09/03/2022 11:00 A M Medical Record Number: [id number] Patient Account Number: [id number] Date of Birth/Sex: Treating RN: June 07, 1961 (61 y.o. M) Primary Care Dalal Livengood: Billey Gosling Other Clinician: Referring Khiry Pasquariello: Treating Ellanore Vanhook/Extender: Mickle Asper in Treatment: 62 Visit Information History Since Last Visit Added or deleted any medications: No Patient Arrived: Cane Any new allergies or adverse reactions: No Arrival Time: 10:51 Had a fall or experienced change in No Accompanied By: self activities of daily living that may affect Transfer Assistance: None risk of falls: Patient Identification Verified: Yes Signs or symptoms of abuse/neglect since last visito No Secondary Verification Process Completed: Yes Hospitalized since last visit: No Patient Requires Transmission-Based Precautions: No Implantable device outside of the clinic excluding No Patient Has Alerts: No cellular tissue based products placed in the center since last visit: Has Dressing in Place as Prescribed: Yes Pain Present Now: No Electronic Signature(s) Signed: 09/03/2022 12:40:42 PM By: Erenest Blank Entered By: Erenest Blank on 09/03/2022 10:52:04 -------------------------------------------------------------------------------- Encounter Discharge Information Details Patient Name: Date of Service: [name] 09/03/2022 11:00 A M Medical Record Number: [id number] Patient Account Number: [id number] Date of Birth/Sex: Treating RN: 09-17-1961 (61 y.o. [name] Primary Care Eily Louvier: Billey Gosling Other Clinician: Referring Amauri Medellin: Treating Shauniece Kwan/Extender: Mickle Asper in Treatment: 17 Encounter Discharge Information Items Post Procedure Vitals Discharge Condition:  Stable Temperature (F): 98. Ambulatory Status: Cane Pulse (bpm): 94 Discharge Destination: Home Respiratory Rate (breaths/min): 20 Transportation: Private Auto Blood Pressure (mmHg): 152/91 Accompanied By: self Schedule Follow-up Appointment: Yes Clinical Summary of Care: Electronic Signature(s) Signed: 09/03/2022 5:21:52 PM By: Deon Pilling RN, BSN Entered By: Deon Pilling on 09/03/2022 11:51:11 [name], [name] ([id number]) [id number] Page 2 of 8 -------------------------------------------------------------------------------- Lower Extremity Assessment Details Patient Name: Date of Service: [name], [name] 09/03/2022 11:00 A M Medical Record Number: [id number] Patient Account Number: [id number] Date of Birth/Sex: Treating RN: 08/29/1961 (61 y.o. M) Primary Care Chrisanne Loose: Billey Gosling Other Clinician: Referring Bill Mcvey: Treating Kazuko Clemence/Extender: Lucile Crater Weeks in Treatment: 17 Edema Assessment Assessed: [Left: No] [Right: No] Edema: [Left: N] [Right: o] Calf Left: Right: Point of Measurement: 37 cm From Medial Instep 40.4 cm Ankle Left: Right: Point of Measurement: 11 cm From Medial Instep 24 cm Electronic Signature(s) Signed: 09/03/2022 12:40:42 PM By: Erenest Blank Entered By: Erenest Blank on 09/03/2022 11:05:26 -------------------------------------------------------------------------------- Multi Wound Chart Details Patient Name: Date of Service: [name] 09/03/2022 11:00 A M Medical Record Number: [id number] Patient Account Number: [id number] Date of Birth/Sex: Treating RN: [birth year] (61 y.o. M) Primary Care Marisela Line: Billey Gosling Other Clinician: Referring Mick Tanguma: Treating Teyonna Plaisted/Extender: Mickle Asper in Treatment: 17 Vital Signs Height(in): 73 Pulse(bpm): 94 Weight(lbs): 237 Blood Pressure(mmHg): 152/91 Body Mass Index(BMI): 31.3 Temperature(F): 98.7 Respiratory  Rate(breaths/min): 18 [5:Photos:] [N/A:N/A] Left, Plantar Foot N/A N/A Wound Location: Gradually Appeared N/A N/A Wounding Event: Diabetic Wound/Ulcer of the Lower N/A N/A Primary Etiology: Extremity Anemia, Sleep Apnea, Congestive N/A N/A Comorbid History: Heart Failure, Hypertension, Peripheral [place], [place] ([id number]) [id number] Page 3 of 8 Venous Disease, Cirrhosis , Type II Diabetes, Osteoarthritis, Osteomyelitis, Neuropathy 10/19/2019 N/A N/A Date Acquired: 87 N/A N/A Weeks of Treatment: Open N/A N/A Wound Status: No N/A N/A Wound Recurrence: 3.4x3x0.5 N/A N/A Measurements L x W x D (cm) 8.011 N/A N/A A (cm) : rea 4.006 N/A N/A Volume (cm) : -92.80% N/A N/A %  Reduction in A rea: -865.30% N/A N/A % Reduction in Volume: Grade 2 N/A N/A Classification: Medium N/A N/A Exudate A mount: Serosanguineous N/A N/A Exudate Type: red, brown N/A N/A Exudate Color: Thickened N/A N/A Wound Margin: Large (67-100%) N/A N/A Granulation A mount: Red, Pink N/A N/A Granulation Quality: Small (1-33%) N/A N/A Necrotic A mount: Fat Layer (Subcutaneous Tissue): Yes N/A N/A Exposed Structures: Fascia: No Tendon: No Muscle: No Joint: No Bone: No None N/A N/A Epithelialization: Debridement - Excisional N/A N/A Debridement: Pre-procedure Verification/Time Out 11:40 N/A N/A Taken: Lidocaine 4% Topical Solution N/A N/A Pain Control: Callus, Subcutaneous, Slough N/A N/A Tissue Debrided: Skin/Subcutaneous Tissue N/A N/A Level: 10.2 N/A N/A Debridement A (sq cm): rea Curette N/A N/A Instrument: Minimum N/A N/A Bleeding: Pressure N/A N/A Hemostasis A chieved: 0 N/A N/A Procedural Pain: 0 N/A N/A Post Procedural Pain: Procedure was tolerated well N/A N/A Debridement Treatment Response: 3.4x3x0.5 N/A N/A Post Debridement Measurements L x W x D (cm) 4.006 N/A N/A Post Debridement Volume: (cm) Callus: Yes N/A N/A Periwound Skin  Texture: Excoriation: No Induration: No Crepitus: No Rash: No Scarring: No Maceration: No N/A N/A Periwound Skin Moisture: Dry/Scaly: No Atrophie Blanche: No N/A N/A Periwound Skin Color: Cyanosis: No Ecchymosis: No Erythema: No Hemosiderin Staining: No Mottled: No Pallor: No Rubor: No No Abnormality N/A N/A Temperature: Debridement N/A N/A Procedures Performed: Treatment Notes Wound #5 (Foot) Wound Laterality: Plantar, Left Cleanser Wound Cleanser Discharge Instruction: Cleanse the wound with wound cleanser prior to applying a clean dressing using gauze sponges, not tissue or cotton balls. Peri-Wound Care Topical blastx Discharge Instruction: may combine the topical antibiotics. topical antibiotics Discharge Instruction: Mix with Blastx and apply directly to wound bed. Primary Dressing KerraCel Ag Gelling Fiber Dressing, 4x5 in (silver alginate) Discharge Instruction: Apply silver alginate over the Blastx and topical antibiotics. Secondary Dressing ZIYAD, DYAR ([id number]) [id number].pdf Page 4 of 8 ABD Pad, 5x9 Discharge Instruction: Apply over primary dressing as directed. Drawtex 4x4 in Discharge Instruction: cut to into wound bed over the alginate. Secured With [employer], 4.5x3.1 (in/yd) Discharge Instruction: Secure with Kerlix as directed. 32M Medipore H Soft Cloth Surgical T ape, 4 x 10 (in/yd) Discharge Instruction: Secure with tape as directed. Compression Wrap Compression Stockings Add-Ons Electronic Signature(s) Signed: 09/06/2022 11:34:26 AM By: Kalman Shan DO Entered By: Kalman Shan on 09/03/2022 11:51:20 -------------------------------------------------------------------------------- Multi-Disciplinary Care Plan Details Patient Name: Date of Service: [name], [name] 09/03/2022 11:00 A M Medical Record Number: [id number] Patient Account Number: [id number] Date of Birth/Sex: Treating RN: 22-Jun-1961 (61  y.o. [name] Primary Care Bekim Werntz: Billey Gosling Other Clinician: Referring Tamia Dial: Treating Drue Harr/Extender: Mickle Asper in Treatment: 17 Active Inactive Necrotic Tissue Nursing Diagnoses: Impaired tissue integrity related to necrotic/devitalized tissue Goals: Necrotic/devitalized tissue will be minimized in the wound bed Date Initiated: 06/11/2022 Target Resolution Date: 09/17/2022 Goal Status: Active Patient/caregiver will verbalize understanding of reason and process for debridement of necrotic tissue Date Initiated: 06/11/2022 Date Inactivated: 08/20/2022 Target Resolution Date: 08/20/2022 Goal Status: Met Interventions: Assess patient pain level pre-, during and post procedure and prior to discharge Treatment Activities: Apply topical anesthetic as ordered : 06/11/2022 Excisional debridement : 06/11/2022 Notes: pale pink tissue- debridement performed by Terrence Wishon. MRSA culture Ayvion Kavanagh order topical antibiotics. Electronic Signature(s) Signed: 09/03/2022 5:21:52 PM By: Deon Pilling RN, BSN Entered By: Deon Pilling on 09/03/2022 11:25:38 [name] ([id number]) [id number] Page 5 of 8 -------------------------------------------------------------------------------- Pain Assessment Details Patient Name: Date of Service: [name], [name] 09/03/2022 11:00  A M Medical Record Number: [id number] Patient Account Number: [id number] Date of Birth/Sex: Treating RN: [birth year] (61 y.o. M) Primary Care Magdaline Zollars: Billey Gosling Other Clinician: Referring Shaneta Cervenka: Treating Alexandria Shiflett/Extender: Mickle Asper in Treatment: 17 Active Problems Location of Pain Severity and Description of Pain Patient Has Paino No Site Locations Pain Management and Medication Current Pain Management: Electronic Signature(s) Signed: 09/03/2022 12:40:42 PM By: Erenest Blank Entered By: Erenest Blank on 09/03/2022  10:54:31 -------------------------------------------------------------------------------- Patient/Caregiver Education Details Patient Name: Date of Service: [name] 11/17/2023andnbsp11:00 A M Medical Record Number: [id number] Patient Account Number: [id number] Date of Birth/Gender: Treating RN: 08/12/1961 (61 y.o. [name] Primary Care Physician: Billey Gosling Other Clinician: Referring Physician: Treating Physician/Extender: Mickle Asper in Treatment: 17 Education Assessment Education Provided To: Patient Education Topics Provided Wound/Skin Impairment: Handouts: Skin Care Do's and Dont's Methods: Explain/Verbal Responses: Reinforcements needed [place], [name] ([id number]) [id number] Page 6 of 8 Electronic Signature(s) Signed: 09/03/2022 5:21:52 PM By: Deon Pilling RN, BSN Entered By: Deon Pilling on 09/03/2022 11:25:55 -------------------------------------------------------------------------------- Wound Assessment Details Patient Name: Date of Service: [name], [name] 09/03/2022 11:00 A M Medical Record Number: [id number] Patient Account Number: [id number] Date of Birth/Sex: Treating RN: 1961-03-21 (61 y.o. M) Primary Care Tarshia Kot: Billey Gosling Other Clinician: Referring Hadassa Cermak: Treating Lasalle Abee/Extender: Mickle Asper in Treatment: 17 Wound Status Wound Number: 5 Primary Diabetic Wound/Ulcer of the Lower Extremity Etiology: Wound Location: Left, Plantar Foot Wound Open Wounding Event: Gradually Appeared Status: Date Acquired: 10/19/2019 Comorbid Anemia, Sleep Apnea, Congestive Heart Failure, Hypertension, Weeks Of Treatment: 17 History: Peripheral Venous Disease, Cirrhosis , Type II Diabetes, Clustered Wound: No Osteoarthritis, Osteomyelitis, Neuropathy Photos Wound Measurements Length: (cm) 3.4 Width: (cm) 3 Depth: (cm) 0.5 Area: (cm) 8.011 Volume: (cm) 4.006 % Reduction in  Area: -92.8% % Reduction in Volume: -865.3% Epithelialization: None Tunneling: No Undermining: No Wound Description Classification: Grade 2 Wound Margin: Thickened Exudate Amount: Medium Exudate Type: Serosanguineous Exudate Color: red, brown Foul Odor After Cleansing: No Slough/Fibrino Yes Wound Bed Granulation Amount: Large (67-100%) Exposed Structure Granulation Quality: Red, Pink Fascia Exposed: No Necrotic Amount: Small (1-33%) Fat Layer (Subcutaneous Tissue) Exposed: Yes Necrotic Quality: Adherent Slough Tendon Exposed: No Muscle Exposed: No Joint Exposed: No Bone Exposed: No Periwound Skin Texture Texture Color No Abnormalities Noted: No No Abnormalities Noted: No Callus: Yes Atrophie Blanche: No Crepitus: No Cyanosis: No Excoriation: No Ecchymosis: No Induration: No Erythema: No [name], [name] ([id number]) [id number] Page 7 of 8 Rash: No Hemosiderin Staining: No Scarring: No Mottled: No Pallor: No Moisture Rubor: No No Abnormalities Noted: No Dry / Scaly: No Temperature / Pain Maceration: No Temperature: No Abnormality Treatment Notes Wound #5 (Foot) Wound Laterality: Plantar, Left Cleanser Wound Cleanser Discharge Instruction: Cleanse the wound with wound cleanser prior to applying a clean dressing using gauze sponges, not tissue or cotton balls. Peri-Wound Care Topical blastx Discharge Instruction: may combine the topical antibiotics. topical antibiotics Discharge Instruction: Mix with Blastx and apply directly to wound bed. Primary Dressing KerraCel Ag Gelling Fiber Dressing, 4x5 in (silver alginate) Discharge Instruction: Apply silver alginate over the Blastx and topical antibiotics. Secondary Dressing ABD Pad, 5x9 Discharge Instruction: Apply over primary dressing as directed. Drawtex 4x4 in Discharge Instruction: cut to into wound bed over the alginate. Secured With [employer], 4.5x3.1  (in/yd) Discharge Instruction: Secure with Kerlix as directed. 33M Medipore H Soft Cloth Surgical T ape, 4 x 10 (in/yd) Discharge Instruction: Secure with tape as directed. Compression Wrap Compression Stockings Add-Ons  Electronic Signature(s) Signed: 09/03/2022 12:40:42 PM By: Erenest Blank Entered By: Erenest Blank on 09/03/2022 11:05:02 -------------------------------------------------------------------------------- Vitals Details Patient Name: Date of Service: [name] 09/03/2022 11:00 A M Medical Record Number: [id number] Patient Account Number: [id number] Date of Birth/Sex: Treating RN: 1961/05/18 (61 y.o. M) Primary Care Neveen Daponte: Billey Gosling Other Clinician: Referring Lucrecia Mcphearson: Treating Jimmy Stipes/Extender: Mickle Asper in Treatment: 17 Vital Signs Time Taken: 10:54 Temperature (F): 98.7 Height (in): 73 Pulse (bpm): 94 Weight (lbs): 237 Respiratory Rate (breaths/min): 18 Body Mass Index (BMI): 31.3 Blood Pressure (mmHg): 152/91 Reference Range: 80 - 120 mg / dl [name], [name] ([id number]) [id number] Page 8 of 8 Electronic Signature(s) Signed: 09/03/2022 12:40:42 PM By: Erenest Blank Entered By: Erenest Blank on 09/03/2022 10:54:22

## 2022-09-06 NOTE — Progress Notes (Signed)
[name], [name] ([id number]) [id number] Page 1 of 14 Visit Report for 09/03/2022 Chief Complaint Document Details Patient Name: Date of Service: [name], [name] 09/03/2022 11:00 A M Medical Record Number: [id number] Patient Account Number: [id number] Date of Birth/Sex: Treating RN: January 31, 1961 (61 y.o. M) Primary Care Provider: [name] Other Clinician: Referring Provider: Treating Provider/Extender: [name] in Treatment: 17 Information Obtained from: Patient Chief Complaint Left foot ulcer Electronic Signature(s) Signed: 09/06/2022 11:34:26 AM By: [name] DO Entered By: [name] on 09/03/2022 11:51:31 -------------------------------------------------------------------------------- Debridement Details Patient Name: Date of Service: [name] 09/03/2022 11:00 A M Medical Record Number: [id number] Patient Account Number: [id number] Date of Birth/Sex: Treating RN: [birth year] (61 y.o. [name] Primary Care Provider: [name] Other Clinician: Referring Provider: Treating Provider/Extender: [name] in Treatment: 17 Debridement Performed for Assessment: Wound #5 Left,Plantar Foot Performed By: Physician [name], DO Debridement Type: Debridement Severity of Tissue Pre Debridement: Fat layer exposed Level of Consciousness (Pre-procedure): Awake and Alert Pre-procedure Verification/Time Out Yes - 11:40 Taken: Start Time: 11:41 Pain Control: Lidocaine 4% T opical Solution T Area Debrided (L x W): otal 3.4 (cm) x 3 (cm) = 10.2 (cm) Tissue and other material debrided: Viable, Non-Viable, Callus, Slough, Subcutaneous, Skin: Dermis , Skin: Epidermis, Slough Level: Skin/Subcutaneous Tissue Debridement Description: Excisional Instrument: Curette Bleeding: Minimum Hemostasis Achieved: Pressure End Time: 11:49 Procedural Pain: 0 Post Procedural Pain: 0 Response  to Treatment: Procedure was tolerated well Level of Consciousness (Post- Awake and Alert procedure): Post Debridement Measurements of Total Wound Length: (cm) 3.4 Width: (cm) 3 Depth: (cm) 0.5 Volume: (cm) 4.006 Character of Wound/Ulcer Post Debridement: Improved Severity of Tissue Post Debridement: Fat layer exposed [name], [name] ([id number]) [id number] Page 2 of 14 Post Procedure Diagnosis Same as Pre-procedure Electronic Signature(s) Signed: 09/03/2022 5:21:52 PM By: Shawn Stall RN, BSN Signed: 09/06/2022 11:34:26 AM By: [name] DO Entered By: Shawn Stall on 09/03/2022 11:50:08 -------------------------------------------------------------------------------- HPI Details Patient Name: Date of Service: [name] 09/03/2022 11:00 A M Medical Record Number: [id number] Patient Account Number: [id number] Date of Birth/Sex: Treating RN: 11/30/1960 (61 y.o. M) Primary Care Provider: [name] Other Clinician: Referring Provider: Treating Provider/Extender: [name] in Treatment: 17 History of Present Illness HPI Description: 10/31/2019 upon evaluation today patient appears to be doing somewhat poorly in regard to his bilateral plantar feet. He has wounds that he tells me have been present since 2012 intermittently off and on. Most recently this has been open for at least the past 6 months to a year. He has been trying to treat this in different ways using Santyl along with various other dressings including Medihoney and even at one point Xeroform. Nothing really has seem to get this completely closed. He was recently in the hospital for cellulitis of his leg subsequently he did have x-rays as well as MRIs that showed negative for any signs of osteomyelitis in regard to the wounds on his feet. Fortunately there is no signs of systemic infection at this time. No fevers, chills, nausea, vomiting, or diarrhea. Patient  has previously used Darco offloading shoes as far as frontal floaters as well as postop surgical shoes. He has never been in a total contact cast that may be something we need to strongly consider here. Patient's most recent hemoglobin A1c 1 month ago was 5.3 seems to be very well controlled which is great. Subsequently he has seen vascular as well as podiatry. His ABIs are 1.07 on  the left and 1.14 on the right he seems to be doing well he does have chronic venous stasis. 11/07/2019 upon evaluation today patient appears to be doing well with regard to his wounds all things considered. I do not see any severe worsening he still has some callus buildup on the right more than the left he notes that he has been probably more active than he should as far as walking is concerned is just very hard to not be active. He knows he needs to be more careful in this regard however. He is willing to give the cast a try at this point although he notes that he is a little nervous about this just with regard to balance although he will be very careful and obviously if he has any trouble he knows to contact the office and let me know. 1/22; patient is in for his obligatory first total contact cast change. Our intake nurse reported a very large amount of drainage which is spelled out over to the surrounding skin. Has bilateral diabetic foot wounds. He has Charcot feet. We have been using silver alginate on his wounds. 11/14/2019 on evaluation today patient is actually seeming to make good improvement in regard to his bilateral plantar foot wounds. We have been using a cast on the left side and on the right side he has been using dressings he is changing up his own accord. With that being said he tells me that he is also not walking as much just due to how unsteady he feels. He takes it easy when he does have to walk and when he does not have to walk he is resting. This is probably help in his right foot as well has the  left foot which is actually measuring better. In fact both are measuring better. Overall I am very pleased with how things seem to be progressing. The patient does have some odor on the left foot this does have me concerned about the possibility of infection, and actually probably go ahead and put him on antibiotics today as well as utilizing a continuation of the cast on the left foot I think that will be fine we probably just need to bring him in sooner to change this not last a whole week. 1/29; we brought the patient back today for a total contact cast change on the left out of concern for excessive drainage. We are using drawtex over the wound as the primary dressing 11/21/2019 on evaluation today patient appears to be doing well with regard to his left plantar foot. In fact both foot ulcers actually seem to be doing pretty well. Nonetheless he is having a lot of drainage on the left at this time and again we did obtain a wound culture did show positive for Staph aureus that was reviewed by myself today as well. Nonetheless he is on Bactrim which was shown to be sensitive that should be helping in this regard. Fortunately there is no signs of infection systemically at this point. 2/5; back in clinic today for a total contact cast change apparently secondary to very significant drainage. Still using drawtex 11/28/2019 upon evaluation today patient appears to be doing well with regard to his wounds. The right foot is doing okay as measured about the same in my opinion. The left foot is actually showing signs of significant improvement is measuring smaller there is a lot of hyper granulation likely due to the continued drainage at this point. We did obtain approval for  a snap VAC I think that is good to be appropriate for him and will likely help this tremendously underneath the cast. He is definitely in agreement with proceeding with such. 2/12; patient came in today after his snap VAC lost suction.  Brought in to see one of our nurses. The dressing was replaced and then we put the cast back on and rehooked up the snap VAC. Apparently his wound looked very good per our intake nurse. 2/15; again we replaced the cast on Friday. By Saturday the snap VAC and light suction. He called this morning he comes in acutely. The wounds look fine however the VAC is not functioning. We replaced the cast using silver alginate as the primary dressing backed with Kerramax. The snap VAC was not replaced 12/05/2019 upon evaluation today patient appears to be doing better in regard to his left plantar foot ulcer. Fortunately there is no signs of active infection at this time. Unfortunately he is continuing to have issues with the right foot he is really not making any progress here things seem to be somewhat stagnant to be honest. The depth has increased but that is due to me having debrided the wound in the past based on what I am seeing. 12/12/2019 on evaluation today patient appears to be doing more poorly in regard to the left lower extremity. He has some erythema spreading up the side of his foot I am concerned about infection again at this point. Unfortunately he has been seeing improvement with a total contact cast but I do not think we should put that on today. On his right plantar foot he continues to have significant drainage this is actually measuring deeper I really do not feel like you are making any progress whatsoever. I have prescribed Granix for him unfortunately his insurance apparently was going to cost him a $500 co-pay. 12/19/2019 upon evaluation today patient actually appears to be doing better in regard to both wounds. With that being said he actually did get the reGranix which he had to pay $500 for. With that being said it does look like that he is actually made some improvement based on what I am seeing at this point with the reGranix. Obviously if he is going to continue this we are going to do  something about trying to get him some help in covering the cost. [place], [name] ([id number]) [id number] Page 3 of 14 12/26/2019 on evaluation today patient appears to be doing really much better even compared to last week. Overall the wound seems to be much better even compared to last week and last week was better than the week before. Since has been using the reGranix his symptoms have improved significantly. With that being said the issue right now is simply that this is a very expensive medication for him the first dose cost him $500. Upon inspection patient's wound bed actually is however dramatically improved compared to before he started this 2 weeks ago. 01/02/2020 upon evaluation today patient appears to actually be doing well. He still had a little bit of reGranix left that has been using in small amounts he just been applying it every other day instead of every day in order to make it go longer. Overall we are still seeing excellent improvement he is measuring smaller looking better healthier tissue and everything seems to be pointing to this headed in the right direction. Fortunately there is no evidence of infection either which is also excellent news. He does have his MRI coming  up within the next week. 01/09/2020 upon evaluation today patient appears to be doing a little worse today compared to previous week's evaluation. He is actually been out of the reGranix at this point. He has been trying to make the stretch out so he has been changing the dressings on a regular set schedule like he was previous. I think this has made a difference. Fortunately there is no signs of active infection at this time. No fevers, chills, nausea, vomiting, or diarrhea. 01/16/2020 upon evaluation today patient appears to be doing well with regard to his left plantar foot ulcer. The right plantar foot still shows some significant depth at this point. Fortunately there is no signs of  active infection at this time. 01/30/2020 upon evaluation today patient appears to be doing about the same in regard to his right plantar foot ulcer there is still some depth here and we had to wait till he actually switched over to his new insurance to get approval for the MRI under his new insurance plan. With that being said he now has switched as of April 1. Fortunately there is no signs of active infection at this time. Overall in regard to his left foot ulcer this seems to be doing much better and I am actually very pleased with how things are going. With that being said it is not quite as much progress as we were seeing with the reGranix but at the same time he has had trouble getting this apparently there is been some hindrance here. I Ernie Hew try to actually send this to [facility] that was recommended by the drug rep to me. 02/13/20 upon evaluation today patient appears to be doing better in regard to his left foot ulcer this is great news. Unfortunately the right foot ulcer is not really significantly better at this time. There is no signs of active infection systemically though he did have his MRI which showed unfortunately he does have infection noted including an abscess in the foot. There is also marrow changes noted which are consistent with osteomyelitis based on the radiology review and interpretation. Unfortunately considering that the wound is not really making the progress that we will he would like to be seen I think that this is an indication that he may need some further referral both infectious disease as well as potentially to podiatrist to see if there is anything that can be done to help with the situation that were dealing with here. The left foot again is doing great. READMISSION 06/04/2021 This is a 61 year old man who was in the clinic in 2021 followed by Allen Derry for areas on the right and left foot. He developed a left foot infection and was referred to ID. He left  the clinic in a nonhealed state and was followed for a period of [facility] Marylene Land. Apparently things really deteriorated in early July when he was admitted to hospital from 04/27/2021 through 05/06/2021 with sepsis secondary to a left foot infection. His blood cultures were negative. An MRI suggested fifth metatarsal osteomyelitis a left ankle septic joint. He was treated with vancomycin and ertapenem which he is still taking and may just about be finishing. He was seen by orthopedics and the patient adamantly refused to BKA. As far as I can tell he did not have the ankle aspirated I am not exactly sure what the issue was here. Since he has been discharged she is at [facility] care for rehabilitation. He was  last seen by Dr. Algis Liming on 05/20/2021 he noted osteo of the tibial talar bone cuboid and fifth metatarsal which is even more extensive than what was suggested by the MRI. He is apparently going for a consultation with orthopedic surgery in [place] sometime next week. I received a call about this man 2 weeks ago from Dr. Allyson Sabal who follows him for the possibility of PAD. ABIs I think done in the office showed a ABI on the right of 1.05 at the PTA and 0.99 at the PTA on the left. He had a DVT rule out in the left leg that was negative for the DVT. Past medical history is extensive and includes diastolic heart failure, right first metatarsal head ulcer in 2021, excision of the right second ray by Dr. Marylene Land on 03/13/2020, hypertension, hypothyroidism. Left total hip replacement, right total knee replacement, carpal tunnel syndrome, obstructive sleep apnea alcohol abuse with cirrhosis although the patient denies current alcohol intake. The patient does not think he is a diabetic however looking through [place] link I see 2 HgbAic's of this year that were greater than equal to 6.5 which by definition makes him diabetic. Nevertheless he is not on any treatment and does  not check his blood sugars. The patient is now back home out of the nursing home. Saw Dr. Algis Liming last week he was taken off vancomycin and ertapenem on August 22 and now is on doxycycline on Augmentin. He also saw Dr. Weston Anna who is his orthopedic surgeon in [place] he recommended a [employer]	[place]. He has been using Medihoney. 9/6I have been having trouble getting hyperbarics approved through our prior authorization process. Even though he had a limb threatening infection in the left foot and probably the left ankle there glitches in how some of the reports are worded also some of the consultants. In any case I am going to repeat his sedimentation rate and C-reactive protein. I am generally not in favor of doing things like this as they really do not alter the plan of care from my point of view however I am going to need to demonstrate that these remain high in order to get this through forhyperbaric treatment for chronic refractory osteomyelitis 9/13; following this man for a wound on his left plantar foot in the setting of type 2 diabetes and Charcot deformity. He has underlying chronic refractory osteomyelitis. Follow-up sedimentation rate and C-reactive protein were both elevated but the C-reactive protein was down to 1.4, sedimentation rate at 70. Sedimentation rate was only slightly down from previous at 85. His wound is measuring slightly smaller. 9/20; patient started hyperbaric oxygen therapy today and tolerated treatment well. This is for the underlying osteomyelitis. He remains on antibiotics but thinks he is getting close to finishing. The wound on the plantar aspect of his foot is the other issue we are following here. He is using Medihoney The patient has a Charcot foot in the setting of type 2 diabetes. He is going to need a total contact cast although his partner was away this week and we elected to delay this till next week 9/27; patient still tolerating hyperbaric oxygen well. Wound  looked generally healthy not much depth under illumination still 100% covered in fibrinous debris. Raised callused edges around the wound he was prepared for a total contact cast. We have been using Medihoney 9/30; patient is back for his first obligatory total contact cast change. We are using Hydrofera Blue. Noted by our intake nurse to have a lot of  drainage or at least a moderate amount of drainage. I am not sure I was previously aware of this 10/4; patient arrives today with a lot of drainage under the cast. When he had it changed last Friday there was also a similar amount of drainage. Her intake nurse says that they tried a wound VAC on him perhaps while I was on vacation in August under the cast but that did not work. In my experience that has not been unusual. We have been using Hydrofera Blue with all the secondary absorbers. The drainage today went right through all of our dressings. The patient is concerned about his foot being in a cast without much drainage. He is tolerating HBO well. There has been improvements in the wound in the mid part of his foot in the setting of a Charcot deformity 10/7; patient presents for cast change. He has no issues or complaints today. He denies signs of infection. 10/11; patient presents for cast change. At this time he would like to take a break from the cast. He would like to do daily dressing changes with Hydrofera Blue. He currently denies signs of infection. 10/14; his cast was taken off last week at his request. He arrives in the clinic with [facility]. He has been changing the dressing himself. He has way too much edema in the left foot and leg to consider a total contact cast. I do not really know the issue here. He does have chronic venous insufficiency His wife stopped me in the clinic earlier in the week to report he is drinking again and she is concerned. I am uncertain whether there are other issues 10/18; he arrived in clinic last week  having bilateral lower extremity edema likely secondary to chronic venous insufficiency there was too much edema in the left leg to apply a total contact cast. I put him in compression on the left leg to control the swelling. This week he cut the wrap on the foot for reasons that are unclear however today he arrives in clinic with a smaller left leg but massive edema in the left foot. He is supposed to be wearing a juxta lite stocking on the right leg but he is not wearing the contact layer. His attendance at hyperbaric oxygen has been dwindling, he did not dive yesterday and he did not dive today [name], [name] ([id number]) [id number] Page 4 of 14 concerned about hyperbarics causing swelling. I looked back in his record his last echo was in 2020 essentially normal left and right ventricular function. Last BUN and creatinine were done in July this was normal. He is having a lot of drainage in the left plantar foot wound 10/25; again he comes in today having missed HBO yesterday. Macerated skin around the wound which really does not look very good at all. A lot of edema in the left foot but an improvement in edema on the left leg. We wrapped him last week because of the amount of swelling in the left leg. We could not apply a total contact cast. The patient states that he wants to be able to change his dressing himself. I might consider this if he had stockings to control the swelling. He said he be here for HBO tomorrow. I will check the degree of erythema in his forefoot which I have marked. He is on doxycycline and Augmentin which was renewed by Dr. Algis Liming but I cannot see a follow-up note, follow-up inflammatory markers etc. I 1 point he said  he was going to see his orthopedic surgeon in [place] I am not sure if he ever did this. I do not know why he has not followed up with infectious disease, he says he was not given an appointment but he is still on Augmentin  doxycycline 11/1; he did not do the lab work I ordered last week. Still on Augmentin and doxycycline. Silver alginate and he is changing this daily using his own juxta lite stocking. The surface of the wound does not look too bad. No real epithelialization however. The patient was seen today along with HBO 08/26/2021 upon evaluation today patient is being seen at his request by myself he wanted to transfer care to see me. With that being said he has been seeing Dr. Leanord Hawking since he came back in August of this year. Unfortunately he tells me he still having quite a bit of drainage. He is also significant erythema and warmth noted of the foot as well. He has been hit or miss with regard to hyperbaric oxygen therapy tells me that at this point he took this week off because he was extremely claustrophobic and having a lot of issues he plans to start back next week. Nonetheless he has been given some medication by Dr. Leanord Hawking as well to help calm things down which again may help him. Hopefully he can get back in hyperbarics as I think this is likely necessary. Nonetheless there does seem to be evidence of cellulitis noted today as well and in general I am a little concerned in that regard. His wound unfortunately is significant on this left foot and is right where he takes the brunt of the force secondary to the Charcot arthropathy. I do not think a total contact cast is ideal due to the fact that he unfortunately is draining much too significantly he is also not happy with the idea of using a cast therefore he states he would not want to do that anyway. 09/16/2021 upon evaluation today patient's plantar foot ulcer unfortunately continues to show signs of issues here. He has been in the hospital due to trying to detox himself from alcohol he had withdrawal symptoms and subsequently was hospitalized. During that time it was recommended by both orthopedics as well as infectious disease apparently that he proceed  with amputation. With that being said they discussed with him the risk of not doing so. This is well- documented in the encounter. With that being said the patient does not want to proceed down that road and tells me that he declined their advice in that regard. Subsequently our goal then is to try to do what we can to try to get this thing healed and closed. I think this means he is getting need to stay off of it is much as possible he does have a wheelchair at home that he tells me he can use I think that that is going to be something that he does need to do. 09/23/2021 upon evaluation today patient continues to have significant issues here with his foot ulcer which is plantar on the left foot. Subsequently again he does have a history of Charcot foot he also has an issue of having had osteomyelitis as well as an open wound for some time here. Its been recommended multiple times for him to have an amputation although that is not something that he is really interested or wanting to do. 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  Subsequently this means that he also has a gait abnormality that is causing issues with his walking and causing abnormal pressures in the central part of his foot this is the reason he has a wound. Nonetheless I want to see what I can do about trying to get a boot to help with stabilization and offloading of working to see what we can do in that regard. 09/30/2021 upon evaluation today patient appears to be doing well with regard to his plantar foot ulcer. He still has significant granular hypergranulation but again this is much less than what it was even last time I saw him last week. Fortunately I think we are headed in the right direction. This is definitely something we could consider a skin graft or something along those lines if we can get it flattened out enough and get the drainage from being so significant. I feel like  we are making some headway here. 10/07/2021 upon evaluation today patient appears to be doing decently well in regard to his wound. Fortunately there does not appear to be any signs of infection overall I feel like it is actually showing some signs of improvement. He still has some hypergranulation but this is much less than its been. 10/21/2021 upon evaluation today patient appears to be doing well with regard to his wound is actually showing some signs of improvement which is great. We have been doing the chemical cauterization with silver nitrate as well as debridement as needed and again has been using silver alginate up to this point. With that being said he does have some Hydrofera Blue at home he wonders if that can be beneficial at this point. 11/25/2021 upon evaluation today patient appears to be doing somewhat poorly in regard to his plantar foot ulcer. He is also been having some issues with his congestive heart failure. He has recently been in the hospital. I did review those notes as well and apparently his heart failure has been somewhat out of control. He is actually being managed as an outpatient in this regard but nonetheless this is something that can still be kept a close eye on according to the notes and what I see. Fortunately I do not see any signs of active infection at this time which is great news. Nonetheless I am concerned about the boggy central portion of the wound which I think is going to likely open up in the near future. Readmission: 01-27-2022 patient presents for follow-up here in the clinic today. His last been November 25, 2021 since have seen him. At that time we just ordered an MRI fortunately the MRI did not show any obvious signs of osteomyelitis and there were some reactive changes which were questionable and could not be excluded. With that being said in the interim since have seen him back on February 8 things have gotten worse from the standpoint of how the wound  appears today. I do think that he is going to require potentially more debridement to clean this wound out than what I can even offer here in the clinic there is a tremendous amount of hypergranulation tissue which is not sufficient to grow new tissue over and honestly I think this is going to lead to a delay in healing if its not taking care of. I need to see if I can get him into see a surgeon to get an opinion on whether or not they could take him to the OR for an aggressive surgical debridement to clear out this  hypergranulation tissue and achieve hemostasis which is can be the biggest issue with me here in the clinic as he does tend to bleed even with very light superficial debridements. The patient's not opposed to this and he states that if I have someone that like for him to see he would be happy to go. Otherwise his medical history really has not changed. He does tell me he is getting ready to go for an evaluation for CPAP machine. Readmission 05/04/2022 Patient was last seen 3 months ago. He states he went to rehab for alcohol abuse. He continues to have a wound on the plantar aspect of the left foot. He states this has never completely healed over the past several years. He is following with podiatry for this issue currently and they are recommending blast X with collagen and a cam boot. He has not started this yet. He has home health. Currently he is using silver alginate. He also follows with Dr. Algis Liming infectious disease who ordered an MRI of the foot as he is concerned for osteomyelitis. CRP and sed rate are elevated. He is currently on Augmentin for prophylaxis as he has a history of osteomyelitis to this foot. Infectious disease has recommended amputation. Currently patient denies systemic signs of infection. 8/7; patient is following up with podiatry on 8/11. He wanted to come in to be debrided. He has no issues or complaints today. He has been using silver alginate to the wound bed.  He has home health that helps change the dressings. He had an MRI completed on 7/18 that did not show evidence of osteomyelitis. He is scheduled to see infectious disease on 8/16. 8/25; Patient is following with podiatry for his wound care and he is currently using silver alginate and a defender boot for his Left foot wound. He would like to have debridement to the wound but is unable to see podiatry until the end of the month. He has asked if we could debride him. 9/21; patient follows with podiatry for his foot wound but likes to come into our clinic for debridements. He was last seen on 06/24/2022 by Dr. Allena Katz, podiatry. He is currently using blast X to the wound bed. He has been using a Psychologist, forensic. He is not scheduled to see podiatry until 10/11 and comes in for debridement today. 10/9; patient presents for follow-up. He states he is no longer following with podiatry. He has been doing silver alginate to the wound bed. He states he would like to attempt a total contact cast but starting next week. He currently denies systemic signs of infection. 10/16; patient presents for follow-up. He has been using silver alginate to the wound bed. We discussed having the cast placed today and patient would like to proceed with this. He has follow-up in 2 days for his cast exchange. He currently denies signs of infection. NIJEE, HEATWOLE ([id number]) [id number] Page 5 of 14 10/18; total contact cast placed 2 days ago. He comes back in for the obligatory total contact cast change HOWEVER our intake nurse noted copious drainage soaking right through the dressings and asked me to look at this. Indeed there was a large amount of drainage soaking right through all of the dressings that were applied. I cannot imagine that this would last 7 days. Even if we change the cast twice a week I would be concerned. We worked on this patient through the late part of 2022 in the early 2023 for this  same wound. We attempted a  cast at that time I think even a cast with a wound VAC and for 1 reason or another we could never make this progress towards closure. Fortunately his recent MRI did not show osteomyelitis. 10/26; patient presents for follow-up. He has been using silver alginate to the wound bed. He has no issues or complaints today. 11/3; wound edges are macerated secondary to copious drainage. PCR culture came back showing MRSA this has been sent to [facility] but we do not yet have that available.. Nursing reports still copious drainage 11/17; patient presents for follow-up. He states he received Keystone antibiotic ointment last week and has been using this With silver alginate. He reports improvement in drainage. He has no issues or complaints today. Electronic Signature(s) Signed: 09/06/2022 11:34:26 AM By: [name] DO Entered By: [name] on 09/03/2022 11:52:07 -------------------------------------------------------------------------------- Physical Exam Details Patient Name: Date of Service: [name], [name] [name] 09/03/2022 11:00 A M Medical Record Number: [id number] Patient Account Number: [id number] Date of Birth/Sex: Treating RN: 1960/12/07 (61 y.o. M) Primary Care Provider: [name] Other Clinician: Referring Provider: Treating Provider/Extender: [name] in Treatment: 17 Constitutional respirations regular, non-labored and within target range for patient.. Cardiovascular 2+ dorsalis pedis/posterior tibialis pulses. Psychiatric pleasant and cooperative. Notes T the left plantar midfoot there is an open wound with granulation tissue and nonviable tissue. Circumferential undermining. No signs of surrounding infection. o Electronic Signature(s) Signed: 09/06/2022 11:34:26 AM By: [name] DO Entered By: [name] on 09/03/2022  11:53:24 -------------------------------------------------------------------------------- Physician Orders Details Patient Name: Date of Service: TRAJAN, GROVE [name] 09/03/2022 11:00 A M Medical Record Number: [id number] Patient Account Number: [id number] Date of Birth/Sex: Treating RN: 12-02-1960 (61 y.o. [name] Primary Care Provider: [name] Other Clinician: Referring Provider: Treating Provider/Extender: [name] in Treatment: 25 Verbal / Phone Orders: No Diagnosis Coding ICD-10 Coding Code Description E11.621 Type 2 diabetes mellitus with foot ulcer [name], [name] ([id number]) [id number] Page 6 of 14 [phone] Non-pressure chronic ulcer of other part of left foot with fat layer exposed M86.372 Chronic multifocal osteomyelitis, left ankle and foot M14.672 Charcot's joint, left ankle and foot Follow-up Appointments ppointment in 2 weeks. - Dr. Mikey Bussing- Plan for a cast. Return A Return appointment in 3 weeks. - Dr. Mikey Bussing Other: - continue topical antibiotics Keystone Antibiotics. Anesthetic (In clinic) Topical Lidocaine 4% applied to wound bed Cellular or Tissue Based Products Cellular or Tissue Based Product Type: - Run IVR for Grafix=20% Copay Bathing/ Shower/ Hygiene May shower with protection but do not get wound dressing(s) wet. - May use cast protector bag from [facility], [employer] or [facility] Edema Control - Lymphedema / SCD / Other Elevate legs to the level of the heart or above for 30 minutes daily and/or when sitting, a frequency of: - throughout the day Avoid standing for long periods of time. Exercise regularly Moisturize legs daily. Compression stocking or Garment 30-40 mm/Hg pressure to: - wear the Juxtalite HD to right and left leg. apply in the morning and remove at night. Off-Loading Other: - Defender Boot: minimal weight bearing left foot use wheelchair for mobility. Additional Orders /  Instructions Follow Nutritious Diet Wound Treatment Wound #5 - Foot Wound Laterality: Plantar, Left Cleanser: Wound Cleanser 1 x Per Day/30 Days Discharge Instructions: Cleanse the wound with wound cleanser prior to applying a clean dressing using gauze sponges, not tissue or cotton balls. Topical: blastx 1 x Per Day/30 Days Discharge Instructions: may combine the topical antibiotics. Topical: topical antibiotics 1 x  Per Day/30 Days Discharge Instructions: Mix with Blastx and apply directly to wound bed. Prim Dressing: KerraCel Ag Gelling Fiber Dressing, 4x5 in (silver alginate) 1 x Per Day/30 Days ary Discharge Instructions: Apply silver alginate over the Blastx and topical antibiotics. Secondary Dressing: ABD Pad, 5x9 1 x Per Day/30 Days Discharge Instructions: Apply over primary dressing as directed. Secondary Dressing: Drawtex 4x4 in 1 x Per Day/30 Days Discharge Instructions: cut to into wound bed over the alginate. Secured With: [employer], 4.5x3.1 (in/yd) 1 x Per Day/30 Days Discharge Instructions: Secure with Kerlix as directed. Secured With: 54M Medipore H Soft Cloth Surgical T ape, 4 x 10 (in/yd) 1 x Per Day/30 Days Discharge Instructions: Secure with tape as directed. Electronic Signature(s) Signed: 09/06/2022 11:34:26 AM By: [name] DO Entered By: [name] on 09/03/2022 11:53:33 -------------------------------------------------------------------------------- Problem List Details Patient Name: Date of Service: TADD, HOLTMEYER [name] 09/03/2022 11:00 A M Medical Record Number: [id number] Patient Account Number: [id number] Date of Birth/Sex: Treating RN: 02-Dec-1960 (61 y.o. [name] [place], [name] ([id number]) [id number] Page 7 of 14 Primary Care Provider: [name] Other Clinician: Referring Provider: Treating Provider/Extender: [name] in Treatment: 17 Active  Problems ICD-10 Encounter Code Description Active Date MDM Diagnosis E11.621 Type 2 diabetes mellitus with foot ulcer 05/04/2022 No Yes L97.522 Non-pressure chronic ulcer of other part of left foot with fat layer exposed 05/04/2022 No Yes M86.372 Chronic multifocal osteomyelitis, left ankle and foot 05/04/2022 No Yes M14.672 Charcot's joint, left ankle and foot 05/04/2022 No Yes Inactive Problems Resolved Problems Electronic Signature(s) Signed: 09/06/2022 11:34:26 AM By: [name] DO Entered By: [name] on 09/03/2022 11:51:08 -------------------------------------------------------------------------------- Progress Note Details Patient Name: Date of Service: [name] 09/03/2022 11:00 A M Medical Record Number: [id number] Patient Account Number: [id number] Date of Birth/Sex: Treating RN: 1961-06-19 (61 y.o. M) Primary Care Provider: [name] Other Clinician: Referring Provider: Treating Provider/Extender: [name] in Treatment: 17 Subjective Chief Complaint Information obtained from Patient Left foot ulcer History of Present Illness (HPI) 10/31/2019 upon evaluation today patient appears to be doing somewhat poorly in regard to his bilateral plantar feet. He has wounds that he tells me have been present since 2012 intermittently off and on. Most recently this has been open for at least the past 6 months to a year. He has been trying to treat this in different ways using Santyl along with various other dressings including Medihoney and even at one point Xeroform. Nothing really has seem to get this completely closed. He was recently in the hospital for cellulitis of his leg subsequently he did have x-rays as well as MRIs that showed negative for any signs of osteomyelitis in regard to the wounds on his feet. Fortunately there is no signs of systemic infection at this time. No fevers, chills, nausea, vomiting, or diarrhea. Patient has  previously used Darco offloading shoes as far as frontal floaters as well as postop surgical shoes. He has never been in a total contact cast that may be something we need to strongly consider here. Patient's most recent hemoglobin A1c 1 month ago was 5.3 seems to be very well controlled which is great. Subsequently he has seen vascular as well as podiatry. His ABIs are 1.07 on the left and 1.14 on the right he seems to be doing well he does have chronic venous stasis. 11/07/2019 upon evaluation today patient appears to be doing well with regard to his wounds all things considered. I do  not see any severe worsening he still has some callus buildup on the right more than the left he notes that he has been probably more active than he should as far as walking is concerned is just very hard to not be active. He knows he needs to be more careful in this regard however. He is willing to give the cast a try at this point although he notes that he is a little nervous about this just with regard to balance although he will be very careful and obviously if he has any trouble he knows to contact the office and let me know. 1/22; patient is in for his obligatory first total contact cast change. Our intake nurse reported a very large amount of drainage which is spelled out over to the surrounding skin. Has bilateral diabetic foot wounds. He has Charcot feet. We have been using silver alginate on his wounds. 11/14/2019 on evaluation today patient is actually seeming to make good improvement in regard to his bilateral plantar foot wounds. We have been using a cast BARBIER, Mccade ([id number]) [id number] Page 8 of 14 on the left side and on the right side he has been using dressings he is changing up his own accord. With that being said he tells me that he is also not walking as much just due to how unsteady he feels. He takes it easy when he does have to walk and when he does not have to walk  he is resting. This is probably help in his right foot as well has the left foot which is actually measuring better. In fact both are measuring better. Overall I am very pleased with how things seem to be progressing. The patient does have some odor on the left foot this does have me concerned about the possibility of infection, and actually probably go ahead and put him on antibiotics today as well as utilizing a continuation of the cast on the left foot I think that will be fine we probably just need to bring him in sooner to change this not last a whole week. 1/29; we brought the patient back today for a total contact cast change on the left out of concern for excessive drainage. We are using drawtex over the wound as the primary dressing 11/21/2019 on evaluation today patient appears to be doing well with regard to his left plantar foot. In fact both foot ulcers actually seem to be doing pretty well. Nonetheless he is having a lot of drainage on the left at this time and again we did obtain a wound culture did show positive for Staph aureus that was reviewed by myself today as well. Nonetheless he is on Bactrim which was shown to be sensitive that should be helping in this regard. Fortunately there is no signs of infection systemically at this point. 2/5; back in clinic today for a total contact cast change apparently secondary to very significant drainage. Still using drawtex 11/28/2019 upon evaluation today patient appears to be doing well with regard to his wounds. The right foot is doing okay as measured about the same in my opinion. The left foot is actually showing signs of significant improvement is measuring smaller there is a lot of hyper granulation likely due to the continued drainage at this point. We did obtain approval for a snap VAC I think that is good to be appropriate for him and will likely help this tremendously underneath the cast. He is definitely in agreement with proceeding  with  such. 2/12; patient came in today after his snap VAC lost suction. Brought in to see one of our nurses. The dressing was replaced and then we put the cast back on and rehooked up the snap VAC. Apparently his wound looked very good per our intake nurse. 2/15; again we replaced the cast on Friday. By Saturday the snap VAC and light suction. He called this morning he comes in acutely. The wounds look fine however the VAC is not functioning. We replaced the cast using silver alginate as the primary dressing backed with Kerramax. The snap VAC was not replaced 12/05/2019 upon evaluation today patient appears to be doing better in regard to his left plantar foot ulcer. Fortunately there is no signs of active infection at this time. Unfortunately he is continuing to have issues with the right foot he is really not making any progress here things seem to be somewhat stagnant to be honest. The depth has increased but that is due to me having debrided the wound in the past based on what I am seeing. 12/12/2019 on evaluation today patient appears to be doing more poorly in regard to the left lower extremity. He has some erythema spreading up the side of his foot I am concerned about infection again at this point. Unfortunately he has been seeing improvement with a total contact cast but I do not think we should put that on today. On his right plantar foot he continues to have significant drainage this is actually measuring deeper I really do not feel like you are making any progress whatsoever. I have prescribed Granix for him unfortunately his insurance apparently was going to cost him a $500 co-pay. 12/19/2019 upon evaluation today patient actually appears to be doing better in regard to both wounds. With that being said he actually did get the reGranix which he had to pay $500 for. With that being said it does look like that he is actually made some improvement based on what I am seeing at this point with  the reGranix. Obviously if he is going to continue this we are going to do something about trying to get him some help in covering the cost. 12/26/2019 on evaluation today patient appears to be doing really much better even compared to last week. Overall the wound seems to be much better even compared to last week and last week was better than the week before. Since has been using the reGranix his symptoms have improved significantly. With that being said the issue right now is simply that this is a very expensive medication for him the first dose cost him $500. Upon inspection patient's wound bed actually is however dramatically improved compared to before he started this 2 weeks ago. 01/02/2020 upon evaluation today patient appears to actually be doing well. He still had a little bit of reGranix left that has been using in small amounts he just been applying it every other day instead of every day in order to make it go longer. Overall we are still seeing excellent improvement he is measuring smaller looking better healthier tissue and everything seems to be pointing to this headed in the right direction. Fortunately there is no evidence of infection either which is also excellent news. He does have his MRI coming up within the next week. 01/09/2020 upon evaluation today patient appears to be doing a little worse today compared to previous week's evaluation. He is actually been out of the reGranix at this point. He has been trying to  make the stretch out so he has been changing the dressings on a regular set schedule like he was previous. I think this has made a difference. Fortunately there is no signs of active infection at this time. No fevers, chills, nausea, vomiting, or diarrhea. 01/16/2020 upon evaluation today patient appears to be doing well with regard to his left plantar foot ulcer. The right plantar foot still shows some significant depth at this point. Fortunately there is no signs of active  infection at this time. 01/30/2020 upon evaluation today patient appears to be doing about the same in regard to his right plantar foot ulcer there is still some depth here and we had to wait till he actually switched over to his new insurance to get approval for the MRI under his new insurance plan. With that being said he now has switched as of April 1. Fortunately there is no signs of active infection at this time. Overall in regard to his left foot ulcer this seems to be doing much better and I am actually very pleased with how things are going. With that being said it is not quite as much progress as we were seeing with the reGranix but at the same time he has had trouble getting this apparently there is been some hindrance here. I Ernie Hew try to actually send this to [facility] that was recommended by the drug rep to me. 02/13/20 upon evaluation today patient appears to be doing better in regard to his left foot ulcer this is great news. Unfortunately the right foot ulcer is not really significantly better at this time. There is no signs of active infection systemically though he did have his MRI which showed unfortunately he does have infection noted including an abscess in the foot. There is also marrow changes noted which are consistent with osteomyelitis based on the radiology review and interpretation. Unfortunately considering that the wound is not really making the progress that we will he would like to be seen I think that this is an indication that he may need some further referral both infectious disease as well as potentially to podiatrist to see if there is anything that can be done to help with the situation that were dealing with here. The left foot again is doing great. READMISSION 06/04/2021 This is a 61 year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

## 2022-09-16 ENCOUNTER — Encounter: Payer: Self-pay | Admitting: Internal Medicine

## 2022-09-16 ENCOUNTER — Encounter: Payer: Self-pay | Admitting: Orthopaedic Surgery

## 2022-09-17 ENCOUNTER — Encounter (HOSPITAL_BASED_OUTPATIENT_CLINIC_OR_DEPARTMENT_OTHER): Payer: Medicare Other | Attending: Internal Medicine | Admitting: Internal Medicine

## 2022-09-17 DIAGNOSIS — E1140 Type 2 diabetes mellitus with diabetic neuropathy, unspecified: Secondary | ICD-10-CM | POA: Diagnosis not present

## 2022-09-17 DIAGNOSIS — L97522 Non-pressure chronic ulcer of other part of left foot with fat layer exposed: Secondary | ICD-10-CM | POA: Diagnosis not present

## 2022-09-17 DIAGNOSIS — Z87891 Personal history of nicotine dependence: Secondary | ICD-10-CM | POA: Diagnosis not present

## 2022-09-17 DIAGNOSIS — E11621 Type 2 diabetes mellitus with foot ulcer: Secondary | ICD-10-CM | POA: Diagnosis present

## 2022-09-17 DIAGNOSIS — K7460 Unspecified cirrhosis of liver: Secondary | ICD-10-CM | POA: Insufficient documentation

## 2022-09-17 DIAGNOSIS — G4733 Obstructive sleep apnea (adult) (pediatric): Secondary | ICD-10-CM | POA: Insufficient documentation

## 2022-09-17 DIAGNOSIS — I5032 Chronic diastolic (congestive) heart failure: Secondary | ICD-10-CM | POA: Diagnosis not present

## 2022-09-17 DIAGNOSIS — I110 Hypertensive heart disease with heart failure: Secondary | ICD-10-CM | POA: Insufficient documentation

## 2022-09-17 DIAGNOSIS — M86372 Chronic multifocal osteomyelitis, left ankle and foot: Secondary | ICD-10-CM | POA: Diagnosis not present

## 2022-09-17 DIAGNOSIS — M1990 Unspecified osteoarthritis, unspecified site: Secondary | ICD-10-CM | POA: Insufficient documentation

## 2022-09-17 DIAGNOSIS — E039 Hypothyroidism, unspecified: Secondary | ICD-10-CM | POA: Insufficient documentation

## 2022-09-17 NOTE — Progress Notes (Signed)
[name], [name] ([id number]) [id number] Page 1 of 14 Visit Report for 09/17/2022 Chief Complaint Document Details Patient Name: Date of Service: [name], [name] 09/17/2022 10:15 A M Medical Record Number: [id number] Patient Account Number: [id number] Date of Birth/Sex: Treating RN: 1961/04/21 (61 y.o. M) Primary Care Provider: [name] Other Clinician: Referring Provider: Treating Provider/Extender: [name] in Treatment: 22 Information Obtained from: Patient Chief Complaint Left foot ulcer Electronic Signature(s) Signed: 09/17/2022 11:44:41 AM By: [name] DO Entered By: [name] on 09/17/2022 11:12:47 -------------------------------------------------------------------------------- Debridement Details Patient Name: Date of Service: [name] 09/17/2022 10:15 A M Medical Record Number: [id number] Patient Account Number: [id number] Date of Birth/Sex: Treating RN: January 31, 1961 (61 y.o. [name] Primary Care Provider: [name] Other Clinician: Referring Provider: Treating Provider/Extender: [name] in Treatment: 19 Debridement Performed for Assessment: Wound #5 Left,Plantar Foot Performed By: Physician [name], DO Debridement Type: Debridement Severity of Tissue Pre Debridement: Fat layer exposed Level of Consciousness (Pre-procedure): Awake and Alert Pre-procedure Verification/Time Out Yes - 11:00 Taken: Start Time: 11:01 Pain Control: Lidocaine 5% topical ointment T Area Debrided (L x W): otal 3.4 (cm) x 3.2 (cm) = 10.88 (cm) Tissue and other material debrided: Viable, Non-Viable, Callus, Slough, Subcutaneous, Skin: Dermis , Skin: Epidermis, Slough Level: Skin/Subcutaneous Tissue Debridement Description: Excisional Instrument: Curette Bleeding: Minimum Hemostasis Achieved: Pressure End Time: 11:05 Procedural Pain: 0 Post Procedural Pain: 0 Response to  Treatment: Procedure was tolerated well Level of Consciousness (Post- Awake and Alert procedure): Post Debridement Measurements of Total Wound Length: (cm) 3.4 Width: (cm) 3.2 Depth: (cm) 0.8 Volume: (cm) 6.836 Character of Wound/Ulcer Post Debridement: Improved Severity of Tissue Post Debridement: Fat layer exposed [name], [name] ([id number]) [id number] Page 2 of 14 Post Procedure Diagnosis Same as Pre-procedure Electronic Signature(s) Signed: 09/17/2022 11:44:41 AM By: [name] DO Signed: 09/17/2022 4:33:25 PM By: Shawn Stall RN, BSN Entered By: Shawn Stall on 09/17/2022 11:06:01 -------------------------------------------------------------------------------- HPI Details Patient Name: Date of Service: [name] 09/17/2022 10:15 A M Medical Record Number: [id number] Patient Account Number: [id number] Date of Birth/Sex: Treating RN: 12-Nov-1960 (61 y.o. M) Primary Care Provider: [name] Other Clinician: Referring Provider: Treating Provider/Extender: [name] in Treatment: 89 History of Present Illness HPI Description: 10/31/2019 upon evaluation today patient appears to be doing somewhat poorly in regard to his bilateral plantar feet. He has wounds that he tells me have been present since 2012 intermittently off and on. Most recently this has been open for at least the past 6 months to a year. He has been trying to treat this in different ways using Santyl along with various other dressings including Medihoney and even at one point Xeroform. Nothing really has seem to get this completely closed. He was recently in the hospital for cellulitis of his leg subsequently he did have x-rays as well as MRIs that showed negative for any signs of osteomyelitis in regard to the wounds on his feet. Fortunately there is no signs of systemic infection at this time. No fevers, chills, nausea, vomiting, or diarrhea. Patient has  previously used [employer] offloading shoes as far as frontal floaters as well as postop surgical shoes. He has never been in a total contact cast that may be something we need to strongly consider here. Patient's most recent hemoglobin A1c 1 month ago was 5.3 seems to be very well controlled which is great. Subsequently he has seen vascular as well as podiatry. His ABIs are 1.07 on the  left and 1.14 on the right he seems to be doing well he does have chronic venous stasis. 11/07/2019 upon evaluation today patient appears to be doing well with regard to his wounds all things considered. I do not see any severe worsening he still has some callus buildup on the right more than the left he notes that he has been probably more active than he should as far as walking is concerned is just very hard to not be active. He knows he needs to be more careful in this regard however. He is willing to give the cast a try at this point although he notes that he is a little nervous about this just with regard to balance although he will be very careful and obviously if he has any trouble he knows to contact the office and let me know. 1/22; patient is in for his obligatory first total contact cast change. Our intake nurse reported a very large amount of drainage which is spelled out over to the surrounding skin. Has bilateral diabetic foot wounds. He has Charcot feet. We have been using silver alginate on his wounds. 11/14/2019 on evaluation today patient is actually seeming to make good improvement in regard to his bilateral plantar foot wounds. We have been using a cast on the left side and on the right side he has been using dressings he is changing up his own accord. With that being said he tells me that he is also not walking as much just due to how unsteady he feels. He takes it easy when he does have to walk and when he does not have to walk he is resting. This is probably help in his right foot as well has the left  foot which is actually measuring better. In fact both are measuring better. Overall I am very pleased with how things seem to be progressing. The patient does have some odor on the left foot this does have me concerned about the possibility of infection, and actually probably go ahead and put him on antibiotics today as well as utilizing a continuation of the cast on the left foot I think that will be fine we probably just need to bring him in sooner to change this not last a whole week. 1/29; we brought the patient back today for a total contact cast change on the left out of concern for excessive drainage. We are using drawtex over the wound as the primary dressing 11/21/2019 on evaluation today patient appears to be doing well with regard to his left plantar foot. In fact both foot ulcers actually seem to be doing pretty well. Nonetheless he is having a lot of drainage on the left at this time and again we did obtain a wound culture did show positive for Staph aureus that was reviewed by myself today as well. Nonetheless he is on Bactrim which was shown to be sensitive that should be helping in this regard. Fortunately there is no signs of infection systemically at this point. 2/5; back in clinic today for a total contact cast change apparently secondary to very significant drainage. Still using drawtex 11/28/2019 upon evaluation today patient appears to be doing well with regard to his wounds. The right foot is doing okay as measured about the same in my opinion. The left foot is actually showing signs of significant improvement is measuring smaller there is a lot of hyper granulation likely due to the continued drainage at this point. We did obtain approval for a  snap VAC I think that is good to be appropriate for him and will likely help this tremendously underneath the cast. He is definitely in agreement with proceeding with such. 2/12; patient came in today after his snap VAC lost suction.  Brought in to see one of our nurses. The dressing was replaced and then we put the cast back on and rehooked up the snap VAC. Apparently his wound looked very good per our intake nurse. 2/15; again we replaced the cast on Friday. By Saturday the snap VAC and light suction. He called this morning he comes in acutely. The wounds look fine however the VAC is not functioning. We replaced the cast using silver alginate as the primary dressing backed with Kerramax. The snap VAC was not replaced 12/05/2019 upon evaluation today patient appears to be doing better in regard to his left plantar foot ulcer. Fortunately there is no signs of active infection at this time. Unfortunately he is continuing to have issues with the right foot he is really not making any progress here things seem to be somewhat stagnant to be honest. The depth has increased but that is due to me having debrided the wound in the past based on what I am seeing. 12/12/2019 on evaluation today patient appears to be doing more poorly in regard to the left lower extremity. He has some erythema spreading up the side of his foot I am concerned about infection again at this point. Unfortunately he has been seeing improvement with a total contact cast but I do not think we should put that on today. On his right plantar foot he continues to have significant drainage this is actually measuring deeper I really do not feel like you are making any progress whatsoever. I have prescribed Granix for him unfortunately his insurance apparently was going to cost him a $500 co-pay. 12/19/2019 upon evaluation today patient actually appears to be doing better in regard to both wounds. With that being said he actually did get the reGranix which he had to pay $500 for. With that being said it does look like that he is actually made some improvement based on what I am seeing at this point with the reGranix. Obviously if he is going to continue this we are going to do  something about trying to get him some help in covering the cost. [name], [name] ([id number]) [id number] Page 3 of 14 12/26/2019 on evaluation today patient appears to be doing really much better even compared to last week. Overall the wound seems to be much better even compared to last week and last week was better than the week before. Since has been using the reGranix his symptoms have improved significantly. With that being said the issue right now is simply that this is a very expensive medication for him the first dose cost him $500. Upon inspection patient's wound bed actually is however dramatically improved compared to before he started this 2 weeks ago. 01/02/2020 upon evaluation today patient appears to actually be doing well. He still had a little bit of reGranix left that has been using in small amounts he just been applying it every other day instead of every day in order to make it go longer. Overall we are still seeing excellent improvement he is measuring smaller looking better healthier tissue and everything seems to be pointing to this headed in the right direction. Fortunately there is no evidence of infection either which is also excellent news. He does have his MRI coming up  within the next week. 01/09/2020 upon evaluation today patient appears to be doing a little worse today compared to previous week's evaluation. He is actually been out of the reGranix at this point. He has been trying to make the stretch out so he has been changing the dressings on a regular set schedule like he was previous. I think this has made a difference. Fortunately there is no signs of active infection at this time. No fevers, chills, nausea, vomiting, or diarrhea. 01/16/2020 upon evaluation today patient appears to be doing well with regard to his left plantar foot ulcer. The right plantar foot still shows some significant depth at this point. Fortunately there is no signs of  active infection at this time. 01/30/2020 upon evaluation today patient appears to be doing about the same in regard to his right plantar foot ulcer there is still some depth here and we had to wait till he actually switched over to his new insurance to get approval for the MRI under his new insurance plan. With that being said he now has switched as of April 1. Fortunately there is no signs of active infection at this time. Overall in regard to his left foot ulcer this seems to be doing much better and I am actually very pleased with how things are going. With that being said it is not quite as much progress as we were seeing with the reGranix but at the same time he has had trouble getting this apparently there is been some hindrance here. I Ernie Hew try to actually send this to [facility] that was recommended by the drug rep to me. 02/13/20 upon evaluation today patient appears to be doing better in regard to his left foot ulcer this is great news. Unfortunately the right foot ulcer is not really significantly better at this time. There is no signs of active infection systemically though he did have his MRI which showed unfortunately he does have infection noted including an abscess in the foot. There is also marrow changes noted which are consistent with osteomyelitis based on the radiology review and interpretation. Unfortunately considering that the wound is not really making the progress that we will he would like to be seen I think that this is an indication that he may need some further referral both infectious disease as well as potentially to podiatrist to see if there is anything that can be done to help with the situation that were dealing with here. The left foot again is doing great. READMISSION 06/04/2021 This is a 61 year old man who was in the clinic in 2021 followed by Allen Derry for areas on the right and left foot. He developed a left foot infection and was referred to ID. He left  the clinic in a nonhealed state and was followed for a period of [facility] Marylene Land. Apparently things really deteriorated in early July when he was admitted to hospital from 04/27/2021 through 05/06/2021 with sepsis secondary to a left foot infection. His blood cultures were negative. An MRI suggested fifth metatarsal osteomyelitis a left ankle septic joint. He was treated with vancomycin and ertapenem which he is still taking and may just about be finishing. He was seen by orthopedics and the patient adamantly refused to BKA. As far as I can tell he did not have the ankle aspirated I am not exactly sure what the issue was here. Since he has been discharged she is at [facility] for rehabilitation. He was last  seen by Dr. Algis Liming on 05/20/2021 he noted osteo of the tibial talar bone cuboid and fifth metatarsal which is even more extensive than what was suggested by the MRI. He is apparently going for a consultation with orthopedic surgery in [place] sometime next week. I received a call about this man 2 weeks ago from Dr. Allyson Sabal who follows him for the possibility of PAD. ABIs I think done in the office showed a ABI on the right of 1.05 at the PTA and 0.99 at the PTA on the left. He had a DVT rule out in the left leg that was negative for the DVT. Past medical history is extensive and includes diastolic heart failure, right first metatarsal head ulcer in 2021, excision of the right second ray by Dr. Marylene Land on 03/13/2020, hypertension, hypothyroidism. Left total hip replacement, right total knee replacement, carpal tunnel syndrome, obstructive sleep apnea alcohol abuse with cirrhosis although the patient denies current alcohol intake. The patient does not think he is a diabetic however looking through [place] [facility] I see 2 HgbAic's of this year that were greater than equal to 6.5 which by definition makes him diabetic. Nevertheless he is not on any treatment and does  not check his blood sugars. The patient is now back home out of the nursing home. Saw Dr. Algis Liming last week he was taken off vancomycin and ertapenem on August 22 and now is on doxycycline on Augmentin. He also saw Dr. Weston Anna who is his orthopedic surgeon in [place] he recommended a [employer]	[place]. He has been using Medihoney. 9/6I have been having trouble getting hyperbarics approved through our prior authorization process. Even though he had a limb threatening infection in the left foot and probably the left ankle there glitches in how some of the reports are worded also some of the consultants. In any case I am going to repeat his sedimentation rate and C-reactive protein. I am generally not in favor of doing things like this as they really do not alter the plan of care from my point of view however I am going to need to demonstrate that these remain high in order to get this through forhyperbaric treatment for chronic refractory osteomyelitis 9/13; following this man for a wound on his left plantar foot in the setting of type 2 diabetes and Charcot deformity. He has underlying chronic refractory osteomyelitis. Follow-up sedimentation rate and C-reactive protein were both elevated but the C-reactive protein was down to 1.4, sedimentation rate at 70. Sedimentation rate was only slightly down from previous at 85. His wound is measuring slightly smaller. 9/20; patient started hyperbaric oxygen therapy today and tolerated treatment well. This is for the underlying osteomyelitis. He remains on antibiotics but thinks he is getting close to finishing. The wound on the plantar aspect of his foot is the other issue we are following here. He is using Medihoney The patient has a Charcot foot in the setting of type 2 diabetes. He is going to need a total contact cast although his partner was away this week and we elected to delay this till next week 9/27; patient still tolerating hyperbaric oxygen well. Wound  looked generally healthy not much depth under illumination still 100% covered in fibrinous debris. Raised callused edges around the wound he was prepared for a total contact cast. We have been using Medihoney 9/30; patient is back for his first obligatory total contact cast change. We are using Hydrofera Blue. Noted by our intake nurse to have a lot of drainage  or at least a moderate amount of drainage. I am not sure I was previously aware of this 10/4; patient arrives today with a lot of drainage under the cast. When he had it changed last Friday there was also a similar amount of drainage. Her intake nurse says that they tried a wound VAC on him perhaps while I was on vacation in August under the cast but that did not work. In my experience that has not been unusual. We have been using Hydrofera Blue with all the secondary absorbers. The drainage today went right through all of our dressings. The patient is concerned about his foot being in a cast without much drainage. He is tolerating HBO well. There has been improvements in the wound in the mid part of his foot in the setting of a Charcot deformity 10/7; patient presents for cast change. He has no issues or complaints today. He denies signs of infection. 10/11; patient presents for cast change. At this time he would like to take a break from the cast. He would like to do daily dressing changes with Hydrofera Blue. He currently denies signs of infection. 10/14; his cast was taken off last week at his request. He arrives in the clinic with [facility]. He has been changing the dressing himself. He has way too much edema in the left foot and leg to consider a total contact cast. I do not really know the issue here. He does have chronic venous insufficiency His wife stopped me in the clinic earlier in the week to report he is drinking again and she is concerned. I am uncertain whether there are other issues 10/18; he arrived in clinic last week  having bilateral lower extremity edema likely secondary to chronic venous insufficiency there was too much edema in the left leg to apply a total contact cast. I put him in compression on the left leg to control the swelling. This week he cut the wrap on the foot for reasons that are unclear however today he arrives in clinic with a smaller left leg but massive edema in the left foot. He is supposed to be wearing a juxta lite stocking on the right leg but he is not wearing the contact layer. His attendance at hyperbaric oxygen has been dwindling, he did not dive yesterday and he did not dive today [name], [name] ([id number]) [id number] Page 4 of 14 concerned about hyperbarics causing swelling. I looked back in his record his last echo was in 2020 essentially normal left and right ventricular function. Last BUN and creatinine were done in July this was normal. He is having a lot of drainage in the left plantar foot wound 10/25; again he comes in today having missed HBO yesterday. Macerated skin around the wound which really does not look very good at all. A lot of edema in the left foot but an improvement in edema on the left leg. We wrapped him last week because of the amount of swelling in the left leg. We could not apply a total contact cast. The patient states that he wants to be able to change his dressing himself. I might consider this if he had stockings to control the swelling. He said he be here for HBO tomorrow. I will check the degree of erythema in his forefoot which I have marked. He is on doxycycline and Augmentin which was renewed by Dr. Algis Liming but I cannot see a follow-up note, follow-up inflammatory markers etc. I 1 point he said he  was going to see his orthopedic surgeon in [place] I am not sure if he ever did this. I do not know why he has not followed up with infectious disease, he says he was not given an appointment but he is still on Augmentin  doxycycline 11/1; he did not do the lab work I ordered last week. Still on Augmentin and doxycycline. Silver alginate and he is changing this daily using his own juxta lite stocking. The surface of the wound does not look too bad. No real epithelialization however. The patient was seen today along with HBO 08/26/2021 upon evaluation today patient is being seen at his request by myself he wanted to transfer care to see me. With that being said he has been seeing Dr. Leanord Hawking since he came back in August of this year. Unfortunately he tells me he still having quite a bit of drainage. He is also significant erythema and warmth noted of the foot as well. He has been hit or miss with regard to hyperbaric oxygen therapy tells me that at this point he took this week off because he was extremely claustrophobic and having a lot of issues he plans to start back next week. Nonetheless he has been given some medication by Dr. Leanord Hawking as well to help calm things down which again may help him. Hopefully he can get back in hyperbarics as I think this is likely necessary. Nonetheless there does seem to be evidence of cellulitis noted today as well and in general I am a little concerned in that regard. His wound unfortunately is significant on this left foot and is right where he takes the brunt of the force secondary to the Charcot arthropathy. I do not think a total contact cast is ideal due to the fact that he unfortunately is draining much too significantly he is also not happy with the idea of using a cast therefore he states he would not want to do that anyway. 09/16/2021 upon evaluation today patient's plantar foot ulcer unfortunately continues to show signs of issues here. He has been in the hospital due to trying to detox himself from alcohol he had withdrawal symptoms and subsequently was hospitalized. During that time it was recommended by both orthopedics as well as infectious disease apparently that he proceed  with amputation. With that being said they discussed with him the risk of not doing so. This is well- documented in the encounter. With that being said the patient does not want to proceed down that road and tells me that he declined their advice in that regard. Subsequently our goal then is to try to do what we can to try to get this thing healed and closed. I think this means he is getting need to stay off of it is much as possible he does have a wheelchair at home that he tells me he can use I think that that is going to be something that he does need to do. 09/23/2021 upon evaluation today patient continues to have significant issues here with his foot ulcer which is plantar on the left foot. Subsequently again he does have a history of Charcot foot he also has an issue of having had osteomyelitis as well as an open wound for some time here. Its been recommended multiple times for him to have an amputation although that is not something that he is really interested or wanting to do. 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 Subsequently  this means that he also has a gait abnormality that is causing issues with his walking and causing abnormal pressures in the central part of his foot this is the reason he has a wound. Nonetheless I want to see what I can do about trying to get a boot to help with stabilization and offloading of working to see what we can do in that regard. 09/30/2021 upon evaluation today patient appears to be doing well with regard to his plantar foot ulcer. He still has significant granular hypergranulation but again this is much less than what it was even last time I saw him last week. Fortunately I think we are headed in the right direction. This is definitely something we could consider a skin graft or something along those lines if we can get it flattened out enough and get the drainage from being so significant. I feel like  we are making some headway here. 10/07/2021 upon evaluation today patient appears to be doing decently well in regard to his wound. Fortunately there does not appear to be any signs of infection overall I feel like it is actually showing some signs of improvement. He still has some hypergranulation but this is much less than its been. 10/21/2021 upon evaluation today patient appears to be doing well with regard to his wound is actually showing some signs of improvement which is great. We have been doing the chemical cauterization with silver nitrate as well as debridement as needed and again has been using silver alginate up to this point. With that being said he does have some Hydrofera Blue at home he wonders if that can be beneficial at this point. 11/25/2021 upon evaluation today patient appears to be doing somewhat poorly in regard to his plantar foot ulcer. He is also been having some issues with his congestive heart failure. He has recently been in the hospital. I did review those notes as well and apparently his heart failure has been somewhat out of control. He is actually being managed as an outpatient in this regard but nonetheless this is something that can still be kept a close eye on according to the notes and what I see. Fortunately I do not see any signs of active infection at this time which is great news. Nonetheless I am concerned about the boggy central portion of the wound which I think is going to likely open up in the near future. Readmission: 01-27-2022 patient presents for follow-up here in the clinic today. His last been November 25, 2021 since have seen him. At that time we just ordered an MRI fortunately the MRI did not show any obvious signs of osteomyelitis and there were some reactive changes which were questionable and could not be excluded. With that being said in the interim since have seen him back on February 8 things have gotten worse from the standpoint of how the wound  appears today. I do think that he is going to require potentially more debridement to clean this wound out than what I can even offer here in the clinic there is a tremendous amount of hypergranulation tissue which is not sufficient to grow new tissue over and honestly I think this is going to lead to a delay in healing if its not taking care of. I need to see if I can get him into see a surgeon to get an opinion on whether or not they could take him to the OR for an aggressive surgical debridement to clear out this hypergranulation  tissue and achieve hemostasis which is can be the biggest issue with me here in the clinic as he does tend to bleed even with very light superficial debridements. The patient's not opposed to this and he states that if I have someone that like for him to see he would be happy to go. Otherwise his medical history really has not changed. He does tell me he is getting ready to go for an evaluation for CPAP machine. Readmission 05/04/2022 Patient was last seen 3 months ago. He states he went to rehab for alcohol abuse. He continues to have a wound on the plantar aspect of the left foot. He states this has never completely healed over the past several years. He is following with podiatry for this issue currently and they are recommending blast X with collagen and a cam boot. He has not started this yet. He has home health. Currently he is using silver alginate. He also follows with Dr. Algis Liming infectious disease who ordered an MRI of the foot as he is concerned for osteomyelitis. CRP and sed rate are elevated. He is currently on Augmentin for prophylaxis as he has a history of osteomyelitis to this foot. Infectious disease has recommended amputation. Currently patient denies systemic signs of infection. 8/7; patient is following up with podiatry on 8/11. He wanted to come in to be debrided. He has no issues or complaints today. He has been using silver alginate to the wound bed.  He has home health that helps change the dressings. He had an MRI completed on 7/18 that did not show evidence of osteomyelitis. He is scheduled to see infectious disease on 8/16. 8/25; Patient is following with podiatry for his wound care and he is currently using silver alginate and a defender boot for his Left foot wound. He would like to have debridement to the wound but is unable to see podiatry until the end of the month. He has asked if we could debride him. 9/21; patient follows with podiatry for his foot wound but likes to come into our clinic for debridements. He was last seen on 06/24/2022 by Dr. Allena Katz, podiatry. He is currently using blast X to the wound bed. He has been using a Psychologist, forensic. He is not scheduled to see podiatry until 10/11 and comes in for debridement today. 10/9; patient presents for follow-up. He states he is no longer following with podiatry. He has been doing silver alginate to the wound bed. He states he would like to attempt a total contact cast but starting next week. He currently denies systemic signs of infection. 10/16; patient presents for follow-up. He has been using silver alginate to the wound bed. We discussed having the cast placed today and patient would like to proceed with this. He has follow-up in 2 days for his cast exchange. He currently denies signs of infection. [name], [name] ([id number]) [id number] Page 5 of 14 10/18; total contact cast placed 2 days ago. He comes back in for the obligatory total contact cast change HOWEVER our intake nurse noted copious drainage soaking right through the dressings and asked me to look at this. Indeed there was a large amount of drainage soaking right through all of the dressings that were applied. I cannot imagine that this would last 7 days. Even if we change the cast twice a week I would be concerned. We worked on this patient through the late part of 2022 in the early 2023 for this  same wound. We attempted a cast  at that time I think even a cast with a wound VAC and for 1 reason or another we could never make this progress towards closure. Fortunately his recent MRI did not show osteomyelitis. 10/26; patient presents for follow-up. He has been using silver alginate to the wound bed. He has no issues or complaints today. 11/3; wound edges are macerated secondary to copious drainage. PCR culture came back showing MRSA this has been sent to [facility] but we do not yet have that available.. Nursing reports still copious drainage 11/17; patient presents for follow-up. He states he received Keystone antibiotic ointment last week and has been using this With silver alginate. He reports improvement in drainage. He has no issues or complaints today. 12/1; patient presents for follow-up. He has been using Keystone antibiotic ointment with silver alginate. He reports continued improvement in drainage. We discussed the total contact cast today. For now he would like to hold off until later in the month. Electronic Signature(s) Signed: 09/17/2022 11:44:41 AM By: [name] DO Entered By: [name] on 09/17/2022 11:13:20 -------------------------------------------------------------------------------- Physical Exam Details Patient Name: Date of Service: [name], [name] 09/17/2022 10:15 A M Medical Record Number: [id number] Patient Account Number: [id number] Date of Birth/Sex: Treating RN: [birth year] (61 y.o. M) Primary Care Provider: [name] Other Clinician: Referring Provider: Treating Provider/Extender: [name] in Treatment: 57 Constitutional respirations regular, non-labored and within target range for patient.. Cardiovascular 2+ dorsalis pedis/posterior tibialis pulses. Psychiatric pleasant and cooperative. Notes T the left plantar midfoot there is an open wound with granulation..Undermining from 12-8 o'clock. No signs of surrounding  infection. o Electronic Signature(s) Signed: 09/17/2022 11:44:41 AM By: [name] DO Entered By: [name] on 09/17/2022 11:14:26 -------------------------------------------------------------------------------- Physician Orders Details Patient Name: Date of Service: [name], [name] [name] 09/17/2022 10:15 A M Medical Record Number: [id number] Patient Account Number: [id number] Date of Birth/Sex: Treating RN: 19-Apr-1961 (61 y.o. [name] Primary Care Provider: [name] Other Clinician: Referring Provider: Treating Provider/Extender: [name] in Treatment: 40 Verbal / Phone Orders: No Diagnosis [address] [name], [name] ([id number]) [id number] Page 6 of 14 ICD-10 Coding Code Description E11.621 Type 2 diabetes mellitus with foot ulcer L97.522 Non-pressure chronic ulcer of other part of left foot with fat layer exposed M86.372 Chronic multifocal osteomyelitis, left ankle and foot M14.672 Charcot's joint, left ankle and foot Follow-up Appointments ppointment in 2 weeks. - Dr. Mikey Bussing Return A Other: - continue topical antibiotics Keystone Antibiotics. hold on the total contact cast till after traveling for the holidays Anesthetic (In clinic) Topical Lidocaine 4% applied to wound bed Cellular or Tissue Based Products Cellular or Tissue Based Product Type: - Run IVR for Grafix=20% Copay Bathing/ Shower/ Hygiene May shower with protection but do not get wound dressing(s) wet. - May use cast protector bag from [facility], [employer] or [facility] Edema Control - Lymphedema / SCD / Other Elevate legs to the level of the heart or above for 30 minutes daily and/or when sitting, a frequency of: - throughout the day Avoid standing for long periods of time. Exercise regularly Moisturize legs daily. Compression stocking or Garment 30-40 mm/Hg pressure to: - wear the Juxtalite HD to right and left leg. apply in the morning and remove  at night. Off-Loading Other: - Defender Boot: minimal weight bearing left foot use wheelchair for mobility. Additional Orders / Instructions Follow Nutritious Diet Wound Treatment Wound #5 - Foot Wound Laterality: Plantar, Left Cleanser: Wound Cleanser 1 x Per Day/30 Days Discharge Instructions: Cleanse the  wound with wound cleanser prior to applying a clean dressing using gauze sponges, not tissue or cotton balls. Topical: blastx 1 x Per Day/30 Days Discharge Instructions: may combine the topical antibiotics. Topical: topical antibiotics 1 x Per Day/30 Days Discharge Instructions: Mix with Blastx and apply directly to wound bed. Prim Dressing: KerraCel Ag Gelling Fiber Dressing, 4x5 in (silver alginate) 1 x Per Day/30 Days ary Discharge Instructions: Apply silver alginate over the Blastx and topical antibiotics. Secondary Dressing: ABD Pad, 5x9 1 x Per Day/30 Days Discharge Instructions: Apply over primary dressing as directed. Secondary Dressing: Drawtex 4x4 in 1 x Per Day/30 Days Discharge Instructions: cut to into wound bed over the alginate. Secured With: [employer], 4.5x3.1 (in/yd) 1 x Per Day/30 Days Discharge Instructions: Secure with Kerlix as directed. Secured With: 20M Medipore H Soft Cloth Surgical T ape, 4 x 10 (in/yd) 1 x Per Day/30 Days Discharge Instructions: Secure with tape as directed. Electronic Signature(s) Signed: 09/17/2022 11:44:41 AM By: [name] DO Entered By: [name] on 09/17/2022 11:14:35 Problem List Details -------------------------------------------------------------------------------- [name] ([id number]) [id number] Page 7 of 14 Patient Name: Date of Service: [name], [name] 09/17/2022 10:15 A M Medical Record Number: [id number] Patient Account Number: [id number] Date of Birth/Sex: Treating RN: 1961/02/14 (61 y.o. [name] Primary Care Provider: [name] Other Clinician: Referring  Provider: Treating Provider/Extender: [name] in Treatment: 60 Active Problems ICD-10 Encounter Code Description Active Date MDM Diagnosis E11.621 Type 2 diabetes mellitus with foot ulcer 05/04/2022 No Yes L97.522 Non-pressure chronic ulcer of other part of left foot with fat layer exposed 05/04/2022 No Yes M86.372 Chronic multifocal osteomyelitis, left ankle and foot 05/04/2022 No Yes M14.672 Charcot's joint, left ankle and foot 05/04/2022 No Yes Inactive Problems Resolved Problems Electronic Signature(s) Signed: 09/17/2022 11:44:41 AM By: [name] DO Entered By: [name] on 09/17/2022 11:12:28 -------------------------------------------------------------------------------- Progress Note Details Patient Name: Date of Service: [name] 09/17/2022 10:15 A M Medical Record Number: [id number] Patient Account Number: [id number] Date of Birth/Sex: Treating RN: 1960/11/11 (61 y.o. M) Primary Care Provider: [name] Other Clinician: Referring Provider: Treating Provider/Extender: [name] in Treatment: 51 Subjective Chief Complaint Information obtained from Patient Left foot ulcer History of Present Illness (HPI) 10/31/2019 upon evaluation today patient appears to be doing somewhat poorly in regard to his bilateral plantar feet. He has wounds that he tells me have been present since 2012 intermittently off and on. Most recently this has been open for at least the past 6 months to a year. He has been trying to treat this in different ways using Santyl along with various other dressings including Medihoney and even at one point Xeroform. Nothing really has seem to get this completely closed. He was recently in the hospital for cellulitis of his leg subsequently he did have x-rays as well as MRIs that showed negative for any signs of osteomyelitis in regard to the wounds on his feet. Fortunately there is no signs of  systemic infection at this time. No fevers, chills, nausea, vomiting, or diarrhea. Patient has previously used Darco offloading shoes as far as frontal floaters as well as postop surgical shoes. He has never been in a total contact cast that may be something we need to strongly consider here. Patient's most recent hemoglobin A1c 1 month ago was 5.3 seems to be very well controlled which is great. Subsequently he has seen vascular as well as podiatry. His ABIs are 1.07 on the left  and 1.14 on the right he seems to be doing well he does have chronic venous stasis. 11/07/2019 upon evaluation today patient appears to be doing well with regard to his wounds all things considered. I do not see any severe worsening he still has some callus buildup on the right more than the left he notes that he has been probably more active than he should as far as walking is concerned is just very hard to not be active. He knows he needs to be more careful in this regard however. He is willing to give the cast a try at this point although he notes that he is a little nervous about this just with regard to balance although he will be very careful and obviously if he has any trouble he knows to contact the office and let me know. [name], [name] ([id number]) [id number] Page 8 of 14 1/22; patient is in for his obligatory first total contact cast change. Our intake nurse reported a very large amount of drainage which is spelled out over to the surrounding skin. Has bilateral diabetic foot wounds. He has Charcot feet. We have been using silver alginate on his wounds. 11/14/2019 on evaluation today patient is actually seeming to make good improvement in regard to his bilateral plantar foot wounds. We have been using a cast on the left side and on the right side he has been using dressings he is changing up his own accord. With that being said he tells me that he is also not walking as much just due to how  unsteady he feels. He takes it easy when he does have to walk and when he does not have to walk he is resting. This is probably help in his right foot as well has the left foot which is actually measuring better. In fact both are measuring better. Overall I am very pleased with how things seem to be progressing. The patient does have some odor on the left foot this does have me concerned about the possibility of infection, and actually probably go ahead and put him on antibiotics today as well as utilizing a continuation of the cast on the left foot I think that will be fine we probably just need to bring him in sooner to change this not last a whole week. 1/29; we brought the patient back today for a total contact cast change on the left out of concern for excessive drainage. We are using drawtex over the wound as the primary dressing 11/21/2019 on evaluation today patient appears to be doing well with regard to his left plantar foot. In fact both foot ulcers actually seem to be doing pretty well. Nonetheless he is having a lot of drainage on the left at this time and again we did obtain a wound culture did show positive for Staph aureus that was reviewed by myself today as well. Nonetheless he is on Bactrim which was shown to be sensitive that should be helping in this regard. Fortunately there is no signs of infection systemically at this point. 2/5; back in clinic today for a total contact cast change apparently secondary to very significant drainage. Still using drawtex 11/28/2019 upon evaluation today patient appears to be doing well with regard to his wounds. The right foot is doing okay as measured about the same in my opinion. The left foot is actually showing signs of significant improvement is measuring smaller there is a lot of hyper granulation likely due to the continued drainage at this  point. We did obtain approval for a snap VAC I think that is good to be appropriate for him and will  likely help this tremendously underneath the cast. He is definitely in agreement with proceeding with such. 2/12; patient came in today after his snap VAC lost suction. Brought in to see one of our nurses. The dressing was replaced and then we put the cast back on and rehooked up the snap VAC. Apparently his wound looked very good per our intake nurse. 2/15; again we replaced the cast on Friday. By Saturday the snap VAC and light suction. He called this morning he comes in acutely. The wounds look fine however the VAC is not functioning. We replaced the cast using silver alginate as the primary dressing backed with Kerramax. The snap VAC was not replaced 12/05/2019 upon evaluation today patient appears to be doing better in regard to his left plantar foot ulcer. Fortunately there is no signs of active infection at this time. Unfortunately he is continuing to have issues with the right foot he is really not making any progress here things seem to be somewhat stagnant to be honest. The depth has increased but that is due to me having debrided the wound in the past based on what I am seeing. 12/12/2019 on evaluation today patient appears to be doing more poorly in regard to the left lower extremity. He has some erythema spreading up the side of his foot I am concerned about infection again at this point. Unfortunately he has been seeing improvement with a total contact cast but I do not think we should put that on today. On his right plantar foot he continues to have significant drainage this is actually measuring deeper I really do not feel like you are making any progress whatsoever. I have prescribed Granix for him unfortunately his insurance apparently was going to cost him a $500 co-pay. 12/19/2019 upon evaluation today patient actually appears to be doing better in regard to both wounds. With that being said he actually did get the reGranix which he had to pay $500 for. With that being said it does look  like that he is actually made some improvement based on what I am seeing at this point with the reGranix. Obviously if he is going to continue this we are going to do something about trying to get him some help in covering the cost. 12/26/2019 on evaluation today patient appears to be doing really much better even compared to last week. Overall the wound seems to be much better even compared to last week and last week was better than the week before. Since has been using the reGranix his symptoms have improved significantly. With that being said the issue right now is simply that this is a very expensive medication for him the first dose cost him $500. Upon inspection patient's wound bed actually is however dramatically improved compared to before he started this 2 weeks ago. 01/02/2020 upon evaluation today patient appears to actually be doing well. He still had a little bit of reGranix left that has been using in small amounts he just been applying it every other day instead of every day in order to make it go longer. Overall we are still seeing excellent improvement he is measuring smaller looking better healthier tissue and everything seems to be pointing to this headed in the right direction. Fortunately there is no evidence of infection either which is also excellent news. He does have his MRI coming up within  the next week. 01/09/2020 upon evaluation today patient appears to be doing a little worse today compared to previous week's evaluation. He is actually been out of the reGranix at this point. He has been trying to make the stretch out so he has been changing the dressings on a regular set schedule like he was previous. I think this has made a difference. Fortunately there is no signs of active infection at this time. No fevers, chills, nausea, vomiting, or diarrhea. 01/16/2020 upon evaluation today patient appears to be doing well with regard to his left plantar foot ulcer. The right plantar  foot still shows some significant depth at this point. Fortunately there is no signs of active infection at this time. 01/30/2020 upon evaluation today patient appears to be doing about the same in regard to his right plantar foot ulcer there is still some depth here and we had to wait till he actually switched over to his new insurance to get approval for the MRI under his new insurance plan. With that being said he now has switched as of April 1. Fortunately there is no signs of active infection at this time. Overall in regard to his left foot ulcer this seems to be doing much better and I am actually very pleased with how things are going. With that being said it is not quite as much progress as we were seeing with the reGranix but at the same time he has had trouble getting this apparently there is been some hindrance here. I Ernie Hew try to actually send this to [facility] that was recommended by the drug rep to me. 02/13/20 upon evaluation today patient appears to be doing better in regard to his left foot ulcer this is great news. Unfortunately the right foot ulcer is not really significantly better at this time. There is no signs of active infection systemically though he did have his MRI which showed unfortunately he does have infection noted including an abscess in the foot. There is also marrow changes noted which are consistent with osteomyelitis based on the radiology review and interpretation. Unfortunately considering that the wound is not really making the progress that we will he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

## 2022-09-20 NOTE — Progress Notes (Signed)
ADAIN, GEURIN ([id number]) [id number] Page 1 of 8 Visit Report for 09/17/2022 Arrival Information Details Patient Name: Date of Service: [name], [name] 09/17/2022 10:15 A M Medical Record Number: [id number] Patient Account Number: [id number] Date of Birth/Sex: Treating RN: 1960-11-27 (61 y.o. [name], [name] Primary Care [name]: [name] Other Clinician: Referring Phinneas Shakoor: Treating Duston Smolenski/Extender: [name] in Treatment: 18 Visit Information History Since Last Visit Added or deleted any medications: No Patient Arrived: [name] Any new allergies or adverse reactions: No Arrival Time: 10:42 Had a fall or experienced change in No Accompanied By: self activities of daily living that may affect Transfer Assistance: None risk of falls: Patient Identification Verified: Yes Signs or symptoms of abuse/neglect since last visito No Secondary Verification Process Completed: Yes Hospitalized since last visit: No Patient Requires Transmission-Based Precautions: No Implantable device outside of the clinic excluding No Patient Has Alerts: No cellular tissue based products placed in the center since last visit: Has Dressing in Place as Prescribed: Yes Pain Present Now: No Electronic Signature(s) Signed: 09/20/2022 3:46:46 PM By: [name] Hammock RN Entered By: [name] on 09/17/2022 10:43:03 -------------------------------------------------------------------------------- Encounter Discharge Information Details Patient Name: Date of Service: [name] 09/17/2022 10:15 A M Medical Record Number: [id number] Patient Account Number: [id number] Date of Birth/Sex: Treating RN: [birth year] (61 y.o. [name] Primary Care [name]: [name] Other Clinician: Referring Demar Shad: Treating Hoda Hon/Extender: [name] in Treatment: 68 Encounter Discharge Information Items Post Procedure  Vitals Discharge Condition: Stable Temperature (F): 98.7 Ambulatory Status: Cane Pulse (bpm): 74 Discharge Destination: Home Respiratory Rate (breaths/min): 20 Transportation: Private Auto Blood Pressure (mmHg): 136/74 Accompanied By: self Schedule Follow-up Appointment: Yes Clinical Summary of Care: Electronic Signature(s) Signed: 09/17/2022 4:33:25 PM By: [name] Pilling RN, BSN Entered By: [name] on 09/17/2022 [place], [name] ([id number]) [id number] Page 2 of 8 -------------------------------------------------------------------------------- Lower Extremity Assessment Details Patient Name: Date of Service: [name], [name] 09/17/2022 10:15 A M Medical Record Number: [id number] Patient Account Number: [id number] Date of Birth/Sex: Treating RN: 1961/05/20 (61 y.o. [name] Primary Care Raiden Haydu: [name] Other Clinician: Referring Kewan Mcnease: Treating Olia Hinderliter/Extender: [name] in Treatment: 19 Edema Assessment Assessed: [Left: Yes] [name]: No] Edema: [Left: N] [Right: o] Calf Left: Right: Point of Measurement: 37 cm From Medial Instep 40.4 cm Ankle Left: Right: Point of Measurement: 11 cm From Medial Instep 24 cm Vascular Assessment Pulses: Dorsalis Pedis Palpable: [Left:Yes] Posterior Tibial Palpable: [Left:Yes] Electronic Signature(s) Signed: 09/20/2022 3:46:46 PM By: [name] Hammock RN Entered By: [name] on 09/17/2022 10:50:24 -------------------------------------------------------------------------------- Multi Wound Chart Details Patient Name: Date of Service: [name] 09/17/2022 10:15 A M Medical Record Number: [id number] Patient Account Number: [id number] Date of Birth/Sex: Treating RN: 1961/10/16 (61 y.o. M) Primary Care [name]: [name] Other Clinician: Referring [name]: Treating [name]/Extender: [name] in Treatment:  87 Vital Signs Height(in): 73 Pulse(bpm): 21 Weight(lbs): 237 Blood Pressure(mmHg): 136/74 Body Mass Index(BMI): 31.3 Temperature(F): 98.7 Respiratory Rate(breaths/min): 17 [5:Photos:] [N/A:N/A] Left, Plantar Foot N/A N/A Wound Location: Gradually Appeared N/A N/A Wounding Event: Diabetic Wound/Ulcer of the Lower N/A N/A Primary Etiology: Extremity Anemia, Sleep Apnea, Congestive N/A N/A Comorbid History: Heart Failure, Hypertension, Peripheral Venous Disease, Cirrhosis , Type II Diabetes, Osteoarthritis, Osteomyelitis, Neuropathy 10/19/2019 N/A N/A Date Acquired: 87 N/A N/A [name] of Treatment: Open N/A N/A Wound Status: No N/A N/A Wound Recurrence: 3.4x3.2x0.8 N/A N/A Measurements L x W x D (cm) 8.545 N/A N/A A (cm) : rea 5.597  N/A N/A Volume (cm) : -105.70% N/A N/A % Reduction in A rea: -1547.20% N/A N/A % Reduction in Volume: 11 Starting Position 1 (o'clock): 7 Ending Position 1 (o'clock): 1.2 Maximum Distance 1 (cm): Yes N/A N/A Undermining: Grade 2 N/A N/A Classification: Medium N/A N/A Exudate A mount: Serosanguineous N/A N/A Exudate Type: red, brown N/A N/A Exudate Color: Thickened N/A N/A Wound Margin: Large (67-100%) N/A N/A Granulation A mount: Red, Pink N/A N/A Granulation Quality: Small (1-33%) N/A N/A Necrotic A mount: Fat Layer (Subcutaneous Tissue): Yes N/A N/A Exposed Structures: Fascia: No Tendon: No Muscle: No Joint: No Bone: No None N/A N/A Epithelialization: Debridement - Excisional N/A N/A Debridement: Pre-procedure Verification/Time Out 11:00 N/A N/A Taken: Lidocaine 5% topical ointment N/A N/A Pain Control: Callus, Subcutaneous, Slough N/A N/A Tissue Debrided: Skin/Subcutaneous Tissue N/A N/A Level: 10.88 N/A N/A Debridement A (sq cm): rea Curette N/A N/A Instrument: Minimum N/A N/A Bleeding: Pressure N/A N/A Hemostasis A chieved: 0 N/A N/A Procedural Pain: 0 N/A N/A Post Procedural  Pain: Procedure was tolerated well N/A N/A Debridement Treatment Response: 3.4x3.2x0.8 N/A N/A Post Debridement Measurements L x W x D (cm) 6.836 N/A N/A Post Debridement Volume: (cm) Callus: Yes N/A N/A Periwound Skin Texture: Excoriation: No Induration: No Crepitus: No Rash: No Scarring: No Maceration: No N/A N/A Periwound Skin Moisture: Dry/Scaly: No Atrophie Blanche: No N/A N/A Periwound Skin Color: Cyanosis: No Ecchymosis: No Erythema: No Hemosiderin Staining: No Mottled: No Pallor: No Rubor: No No Abnormality N/A N/A Temperature: Debridement N/A N/A Procedures Performed: Treatment Notes Wound #5 (Foot) Wound Laterality: Plantar, Left Cleanser Wound Cleanser Discharge Instruction: Cleanse the wound with wound cleanser prior to applying a clean dressing using gauze sponges, not tissue or cotton balls. [place], [place] ([id number]) [id number] Page 4 of 8 Topical blastx Discharge Instruction: may combine the topical antibiotics. topical antibiotics Discharge Instruction: Mix with Blastx and apply directly to wound bed. Primary Dressing KerraCel Ag Gelling Fiber Dressing, 4x5 in (silver alginate) Discharge Instruction: Apply silver alginate over the Blastx and topical antibiotics. Secondary Dressing ABD Pad, 5x9 Discharge Instruction: Apply over primary dressing as directed. Drawtex 4x4 in Discharge Instruction: cut to into wound bed over the alginate. Secured With [employer], 4.5x3.1 (in/yd) Discharge Instruction: Secure with Kerlix as directed. 54M Medipore H Soft Cloth Surgical T ape, 4 x 10 (in/yd) Discharge Instruction: Secure with tape as directed. Compression Wrap Compression Stockings Add-Ons Electronic Signature(s) Signed: 09/17/2022 11:44:41 AM By: Kalman Shan DO Entered By: Kalman Shan on 09/17/2022  11:12:36 -------------------------------------------------------------------------------- Multi-Disciplinary Care Plan Details Patient Name: Date of Service: [name], [name] 09/17/2022 10:15 A M Medical Record Number: [id number] Patient Account Number: [id number] Date of Birth/Sex: Treating RN: 1960/10/31 (61 y.o. [name] Primary Care Aceson Labell: [name] Other Clinician: Referring Jamire Shabazz: Treating Clara Herbison/Extender: [name] in Treatment: 28 Active Inactive Necrotic Tissue Nursing Diagnoses: Impaired tissue integrity related to necrotic/devitalized tissue Goals: Necrotic/devitalized tissue will be minimized in the wound bed Date Initiated: 06/11/2022 Target Resolution Date: 11/19/2022 Goal Status: Active Patient/caregiver will verbalize understanding of reason and process for debridement of necrotic tissue Date Initiated: 06/11/2022 Date Inactivated: 08/20/2022 Target Resolution Date: 08/20/2022 Goal Status: Met Interventions: Assess patient pain level pre-, during and post procedure and prior to discharge Treatment Activities: Apply topical anesthetic as ordered : 06/11/2022 Excisional debridement : 06/11/2022 BOWIE, [name] ([id number]) [id number].pdf Page 5 of 8 Notes: pale pink tissue- debridement performed by Clent Damore. MRSA culture Iman Reinertsen order topical antibiotics. Electronic Signature(s) Signed: 09/17/2022  4:33:25 PM By: [name] Pilling RN, BSN Entered By: [name] on 09/17/2022 11:01:35 -------------------------------------------------------------------------------- Pain Assessment Details Patient Name: Date of Service: [name], [name] 09/17/2022 10:15 A M Medical Record Number: [id number] Patient Account Number: [id number] Date of Birth/Sex: Treating RN: 1961/03/30 (61 y.o. [name] Primary Care Kashon Kraynak: [name] Other Clinician: Referring Artice Bergerson: Treating Masud Holub/Extender: [name] in Treatment: 17 Active Problems Location of Pain Severity and Description of Pain Patient Has Paino No Site Locations Pain Management and Medication Current Pain Management: Electronic Signature(s) Signed: 09/20/2022 3:46:46 PM By: [name] Hammock RN Entered By: [name] on 09/17/2022 10:50:18 -------------------------------------------------------------------------------- Patient/Caregiver Education Details Patient Name: Date of Service: [name] 12/1/2023andnbsp10:15 A M Medical Record Number: [id number] Patient Account Number: [id number] Date of Birth/Gender: Treating RN: 05-Jun-1961 (61 y.o. [name] Primary Care Physician: [name] Other Clinician: Referring Physician: Treating Physician/Extender: [name] in Treatment: [address] [place], [place] ([id number]) [id number] Page 6 of 8 Education Provided To: Patient Education Topics Provided Wound/Skin Impairment: Handouts: Skin Care Do's and Dont's Methods: Explain/Verbal Responses: Reinforcements needed Electronic Signature(s) Signed: 09/17/2022 4:33:25 PM By: [name] Pilling RN, BSN Entered By: [name] on 09/17/2022 11:02:54 -------------------------------------------------------------------------------- Wound Assessment Details Patient Name: Date of Service: [name] 09/17/2022 10:15 A M Medical Record Number: [id number] Patient Account Number: [id number] Date of Birth/Sex: Treating RN: 01-25-1961 (61 y.o. [name], [name] Primary Care Olla Delancey: [name] Other Clinician: Referring Haiden Rawlinson: Treating Briyana Badman/Extender: [name] in Treatment: 19 Wound Status Wound Number: 5 Primary Diabetic Wound/Ulcer of the Lower Extremity Etiology: Wound Location: Left, Plantar Foot Wound Open Wounding Event: Gradually Appeared Status: Date Acquired:  10/19/2019 Comorbid Anemia, Sleep Apnea, Congestive Heart Failure, Hypertension, [name] Of Treatment: 19 History: Peripheral Venous Disease, Cirrhosis , Type II Diabetes, Clustered Wound: No Osteoarthritis, Osteomyelitis, Neuropathy Photos Wound Measurements Length: (cm) 3.4 Width: (cm) 3.2 Depth: (cm) 0.8 Area: (cm) 8.545 Volume: (cm) 6.836 % Reduction in Area: -105.7% % Reduction in Volume: -1547.2% Epithelialization: None Tunneling: No Undermining: Yes Starting Position (o'clock): 11 Ending Position (o'clock): 7 Maximum Distance: (cm) 1.2 Wound Description Classification: Grade 2 Wound Margin: Thickened Exudate Amount: Medium Exudate Type: Serosanguineous Exudate Color: red, brown Foul Odor After Cleansing: No Slough/Fibrino Yes Wound Bed [place], [name] ([id number]) [phone].pdf Page 7 of 8 Granulation Amount: Large (67-100%) Exposed Structure Granulation Quality: Red, Pink Fascia Exposed: No Necrotic Amount: Small (1-33%) Fat Layer (Subcutaneous Tissue) Exposed: Yes Tendon Exposed: No Muscle Exposed: No Joint Exposed: No Bone Exposed: No Periwound Skin Texture Texture Color No Abnormalities Noted: No No Abnormalities Noted: No Callus: Yes Atrophie Blanche: No Crepitus: No Cyanosis: No Excoriation: No Ecchymosis: No Induration: No Erythema: No Rash: No Hemosiderin Staining: No Scarring: No Mottled: No Pallor: No Moisture Rubor: No No Abnormalities Noted: No Dry / Scaly: No Temperature / Pain Maceration: No Temperature: No Abnormality Treatment Notes Wound #5 (Foot) Wound Laterality: Plantar, Left Cleanser Wound Cleanser Discharge Instruction: Cleanse the wound with wound cleanser prior to applying a clean dressing using gauze sponges, not tissue or cotton balls. Peri-Wound Care Topical blastx Discharge Instruction: may combine the topical antibiotics. topical antibiotics Discharge Instruction: Mix with Blastx and apply  directly to wound bed. Primary Dressing KerraCel Ag Gelling Fiber Dressing, 4x5 in (silver alginate) Discharge Instruction: Apply silver alginate over the Blastx and topical antibiotics. Secondary Dressing ABD Pad, 5x9 Discharge Instruction: Apply over primary dressing as directed. Drawtex 4x4 in Discharge Instruction: cut to into wound bed  over the alginate. Secured With [employer], 4.5x3.1 (in/yd) Discharge Instruction: Secure with Kerlix as directed. 23M Medipore H Soft Cloth Surgical T ape, 4 x 10 (in/yd) Discharge Instruction: Secure with tape as directed. Compression Wrap Compression Stockings Add-Ons Electronic Signature(s) Signed: 09/17/2022 1:02:55 PM By: Erenest Blank Signed: 09/20/2022 3:46:46 PM By: [name] Hammock RN Entered By: Erenest Blank on 09/17/2022 10:54:56 Vitals Details -------------------------------------------------------------------------------- [name] ([id number]) [id number] Page 8 of 8 Patient Name: Date of Service: [name], [name] 09/17/2022 10:15 A M Medical Record Number: [id number] Patient Account Number: [id number] Date of Birth/Sex: Treating RN: [birth year] (61 y.o. [name], [name] Primary Care Amara Manalang: [name] Other Clinician: Referring Stori Royse: Treating Kinzly Pierrelouis/Extender: [name] in Treatment: 19 Vital Signs Time Taken: 10:43 Temperature (F): 98.7 Height (in): 73 Pulse (bpm): 74 Weight (lbs): 237 Respiratory Rate (breaths/min): 17 Body Mass Index (BMI): 31.3 Blood Pressure (mmHg): 136/74 Reference Range: 80 - 120 mg / dl Electronic Signature(s) Signed: 09/20/2022 3:46:46 PM By: [name] Hammock RN Entered By: [name] on 09/17/2022 10:50:12

## 2022-09-22 ENCOUNTER — Other Ambulatory Visit: Payer: Self-pay | Admitting: Internal Medicine

## 2022-09-22 ENCOUNTER — Encounter: Payer: Self-pay | Admitting: Internal Medicine

## 2022-09-22 DIAGNOSIS — G4733 Obstructive sleep apnea (adult) (pediatric): Secondary | ICD-10-CM

## 2022-09-22 NOTE — Telephone Encounter (Signed)
Tammy, please advise on pt's message regarding his CPAP pressure. Thanks.

## 2022-09-23 NOTE — Telephone Encounter (Signed)
Last download from ov showed :  CPAP download shows 87% compliance. Daily average usage at 5 hours. Patient is on auto CPAP 5 to 16 cm H2O. AHI is 5.6/hour. Positive mask leaks. Daily average pressure at 13.7 cm H2O.   Can decrease CPAP pressure auto 5 to 13 cmH2O .

## 2022-09-26 MED ORDER — MELOXICAM 7.5 MG PO TABS: 7.5000 mg | ORAL_TABLET | Freq: Every day | ORAL | 0 refills | Status: DC

## 2022-09-29 NOTE — Telephone Encounter (Signed)
Download printed to be reviewed by TP. Routing encounter back to Tammy.

## 2022-09-29 NOTE — Telephone Encounter (Signed)
New mychart message sent by pt: [name] (supporting Tammy S Parrett, NP)18 hours ago (4:12 PM)    I've tried the CPAP with the new lower pressure setting and the nasal pillow. Unfortunately, there has not been any improvement. After the RAMP period, the air blows out of the pillow with a lot of force. Also, the pillow leaks, I can hear and feel the air leaking out. Some nights I am able to fall asleep before the RAMP period is over, but I always wake up in the middle of night and the pillow is off of my head and on the floor.  Any suggestions on how to improve things? Can the pressure be reduced further?  Thank You    Tammy, please advise.

## 2022-09-29 NOTE — Addendum Note (Signed)
Addended by: Wyvonne Lenz on: 09/29/2022 04:50 PM   Modules accepted: Orders

## 2022-09-29 NOTE — Telephone Encounter (Signed)
Would recommend talking to DME company for mask fitting may be consider a [employer]ear full facemask.  Also can talk to DME company regarding ramp.  Timing to see if this can be shortened.   Would recommend changing CPAP pressure to auto CPAP 8 to 13 cm H2O.  Please print off last week CPAP download

## 2022-09-30 ENCOUNTER — Encounter (HOSPITAL_BASED_OUTPATIENT_CLINIC_OR_DEPARTMENT_OTHER): Payer: Medicare Other | Admitting: Internal Medicine

## 2022-09-30 DIAGNOSIS — L97522 Non-pressure chronic ulcer of other part of left foot with fat layer exposed: Secondary | ICD-10-CM | POA: Diagnosis not present

## 2022-09-30 DIAGNOSIS — E11621 Type 2 diabetes mellitus with foot ulcer: Secondary | ICD-10-CM | POA: Diagnosis not present

## 2022-09-30 NOTE — Progress Notes (Signed)
[name] ([id number]) [id number] Page 1 of 7 Visit Report for 09/30/2022 Arrival Information Details Patient Name: Date of Service: [name], [name] 09/30/2022 12:30 PM Medical Record Number: [id number] Patient Account Number: [id number] Date of Birth/Sex: Treating RN: Mar 08, 1961 (61 y.o. [name] Primary Care Provider: Billey [name] Other Clinician: Referring Provider: Treating Provider/Extender: [name] in Treatment: 21 Visit Information History Since Last Visit Added or deleted any medications: No Patient Arrived: [name] Any [name] allergies or adverse reactions: No Arrival Time: 12:41 Had a fall or experienced change in No Accompanied By: self activities of daily living that may affect Transfer Assistance: None risk of falls: Patient Identification Verified: Yes Signs or symptoms of abuse/neglect since last visito No Secondary Verification Process Completed: Yes Hospitalized since last visit: No Patient Requires Transmission-Based Precautions: No Implantable device outside of the clinic excluding No Patient Has Alerts: No cellular tissue based products placed in the center since last visit: Has Dressing in Place as Prescribed: Yes Has Footwear/Offloading in Place as Prescribed: Yes Left: Other:foot defender boot Pain Present Now: No Electronic Signature(s) Signed: 09/30/2022 4:55:27 PM By: [name] Pilling RN, BSN Entered By: [name] on 09/30/2022 12:41:26 -------------------------------------------------------------------------------- Encounter Discharge Information Details Patient Name: Date of Service: [name] 09/30/2022 12:30 PM Medical Record Number: [id number] Patient Account Number: [id number] Date of Birth/Sex: Treating RN: [birth year] (61 y.o. [name] Primary Care Provider: Billey [name] Other Clinician: Referring Provider: Treating Provider/Extender: [name] in Treatment: 21 Encounter Discharge Information Items Discharge Condition: Stable Ambulatory Status: Ambulatory Discharge Destination: Home Transportation: Private Auto Accompanied By: self Schedule Follow-up Appointment: Yes Clinical Summary of Care: Patient Declined Electronic Signature(s) Signed: 09/30/2022 5:16:09 PM By: [name] Hammock RN Entered By: [name] on 09/30/2022 13:36:56 [name], [name] ([id number]) [id number] Page 2 of 7 -------------------------------------------------------------------------------- Lower Extremity Assessment Details Patient Name: Date of Service: [name], [name] 09/30/2022 12:30 PM Medical Record Number: [id number] Patient Account Number: [id number] Date of Birth/Sex: Treating RN: [birth year] (61 y.o. [name] Primary Care Provider: Billey [name] Other Clinician: Referring Provider: Treating Provider/Extender: [name] Weeks in Treatment: 21 Edema Assessment Assessed: [Left: Yes] [Right: No] Edema: [Left: N] [Right: o] Calf Left: Right: Point of Measurement: 37 cm From Medial Instep 38.5 cm Ankle Left: Right: Point of Measurement: 11 cm From Medial Instep 24 cm Vascular Assessment Pulses: Dorsalis Pedis Palpable: [Left:Yes] Electronic Signature(s) Signed: 09/30/2022 4:55:27 PM By: [name] Pilling RN, BSN Entered By: [name] on 09/30/2022 12:41:58 -------------------------------------------------------------------------------- Multi Wound Chart Details Patient Name: Date of Service: [name] 09/30/2022 12:30 PM Medical Record Number: [id number] Patient Account Number: [id number] Date of Birth/Sex: Treating RN: [birth year] (61 y.o. M) Primary Care Provider: Billey [name] Other Clinician: Referring Provider: Treating Provider/Extender: [name] in Treatment: 21 Vital Signs Height(in): 73 Pulse(bpm): 90 Weight(lbs):  237 Blood Pressure(mmHg): 124/80 Body Mass Index(BMI): 31.3 Temperature(F): 97.9 Respiratory Rate(breaths/min): 20 [5:Photos:] [N/A:N/A] Left, Plantar Foot N/A N/A Wound Location: Gradually Appeared N/A N/A Wounding Event: [name], [name] ([id number]) [id number] Page 3 of 7 Diabetic Wound/Ulcer of the Lower N/A N/A Primary Etiology: Extremity Anemia, Sleep Apnea, Congestive N/A N/A Comorbid History: Heart Failure, Hypertension, Peripheral Venous Disease, Cirrhosis , Type II Diabetes, Osteoarthritis, Osteomyelitis, Neuropathy 10/19/2019 N/A N/A Date Acquired: 21 N/A N/A Weeks of Treatment: Open N/A N/A Wound Status: No N/A N/A Wound Recurrence: 3.3x3.4x0.8 N/A N/A Measurements L x W x D (cm) 8.812 N/A N/A A (cm) : rea 7.05 N/A N/A Volume (  cm) : -112.10% N/A N/A % Reduction in A rea: -1598.80% N/A N/A % Reduction in Volume: 12 Starting Position 1 (o'clock): 6 Ending Position 1 (o'clock): 0.8 Maximum Distance 1 (cm): Yes N/A N/A Undermining: Grade 2 N/A N/A Classification: Medium N/A N/A Exudate A mount: Serosanguineous N/A N/A Exudate Type: red, brown N/A N/A Exudate Color: Thickened N/A N/A Wound Margin: Large (67-100%) N/A N/A Granulation A mount: Red, Pink, Hyper-granulation N/A N/A Granulation Quality: None Present (0%) N/A N/A Necrotic A mount: Fat Layer (Subcutaneous Tissue): Yes N/A N/A Exposed Structures: Fascia: No Tendon: No Muscle: No Joint: No Bone: No None N/A N/A Epithelialization: Callus: Yes N/A N/A Periwound Skin Texture: Excoriation: No Induration: No Crepitus: No Rash: No Scarring: No Maceration: No N/A N/A Periwound Skin Moisture: Dry/Scaly: No Atrophie Blanche: No N/A N/A Periwound Skin Color: Cyanosis: No Ecchymosis: No Erythema: No Hemosiderin Staining: No Mottled: No Pallor: No Rubor: No No Abnormality N/A N/A Temperature: Chemical Cauterization N/A N/A Procedures  Performed: Treatment Notes Electronic Signature(s) Signed: 09/30/2022 5:16:42 PM By: [name] Shan DO Entered By: [name] on 09/30/2022 12:58:06 -------------------------------------------------------------------------------- Multi-Disciplinary Care Plan Details Patient Name: Date of Service: [name] 09/30/2022 12:30 PM Medical Record Number: [id number] Patient Account Number: [id number] Date of Birth/Sex: Treating RN: 11-19-1960 (61 y.o. [name] Primary Care Faron Tudisco: [name] Other Clinician: Referring Shardee Dieu: Treating Jessiah Steinhart/Extender: [name] in Treatment: [address] [place], [place] ([id number]) [id number] Page 4 of 7 Necrotic Tissue Nursing Diagnoses: Impaired tissue integrity related to necrotic/devitalized tissue Goals: Necrotic/devitalized tissue will be minimized in the wound bed Date Initiated: 06/11/2022 Target Resolution Date: 11/19/2022 Goal Status: Active Patient/caregiver will verbalize understanding of reason and process for debridement of necrotic tissue Date Initiated: 06/11/2022 Date Inactivated: 08/20/2022 Target Resolution Date: 08/20/2022 Goal Status: Met Interventions: Assess patient pain level pre-, during and post procedure and prior to discharge Treatment Activities: Apply topical anesthetic as ordered : 06/11/2022 Excisional debridement : 06/11/2022 Notes: pale pink tissue- debridement performed by Kiandra Sanguinetti. MRSA culture Davon Abdelaziz order topical antibiotics. Electronic Signature(s) Signed: 09/30/2022 5:16:09 PM By: [name] Hammock RN Entered By: [name] on 09/30/2022 11:24:59 -------------------------------------------------------------------------------- Pain Assessment Details Patient Name: Date of Service: [name], [name] [name] 09/30/2022 12:30 PM Medical Record Number: [id number] Patient Account Number: [id number] Date of Birth/Sex: Treating  RN: [birth year] (61 y.o. [name] Primary Care Khan Chura: [name] Other Clinician: Referring Ashkan Chamberland: Treating Tere Mcconaughey/Extender: [name] in Treatment: 21 Active Problems Location of Pain Severity and Description of Pain Patient Has Paino No Site Locations Rate the pain. Current Pain Level: 0 Pain Management and Medication Current Pain Management: Medication: No Cold Application: No Rest: No Massage: No Activity: No T.E.N.S.: No Heat Application: No Leg drop or elevation: No Is the Current Pain Management Adequate: Adequate [name], [name] ([id number]) [id number] Page 5 of 7 How does your wound impact your activities of daily livingo Sleep: No Bathing: No Appetite: No Relationship With Others: No Bladder Continence: No Emotions: No Bowel Continence: No Work: No Toileting: No Drive: No Dressing: No Hobbies: No Electronic Signature(s) Signed: 09/30/2022 4:55:27 PM By: [name] Pilling RN, BSN Entered By: [name] on 09/30/2022 12:41:39 -------------------------------------------------------------------------------- Patient/Caregiver Education Details Patient Name: Date of Service: [name] 12/14/2023andnbsp12:30 PM Medical Record Number: [id number] Patient Account Number: [id number] Date of Birth/Gender: Treating RN: 05-Aug-1961 (61 y.o. [name] Primary Care Physician: [name] Other Clinician: Referring Physician: Treating Physician/Extender: [name] in Treatment: 21 Education Assessment Education Provided To:  Patient Education Topics Provided Wound/Skin Impairment: Methods: Explain/Verbal Responses: Reinforcements needed, State content correctly Electronic Signature(s) Signed: 09/30/2022 5:16:09 PM By: [name] Hammock RN Entered By: [name] on 09/30/2022  11:25:15 -------------------------------------------------------------------------------- Wound Assessment Details Patient Name: Date of Service: [name], [name] [name] 09/30/2022 12:30 PM Medical Record Number: [id number] Patient Account Number: [id number] Date of Birth/Sex: Treating RN: [birth year] (61 y.o. [name] Primary Care Williamson Cavanah: [name] Other Clinician: Referring Nazeer Romney: Treating Jacelyn Cuen/Extender: [name] Weeks in Treatment: 21 Wound Status Wound Number: 5 Primary Diabetic Wound/Ulcer of the Lower Extremity Etiology: Wound Location: Left, Plantar Foot Wound Open Wounding Event: Gradually Appeared Status: Date Acquired: 10/19/2019 Comorbid Anemia, Sleep Apnea, Congestive Heart Failure, Hypertension, Weeks Of Treatment: 21 History: Peripheral Venous Disease, Cirrhosis , Type II Diabetes, Clustered Wound: No Osteoarthritis, Osteomyelitis, Neuropathy Photos [name], [name] ([id number]) [id number] Page 6 of 7 Wound Measurements Length: (cm) 3.3 Width: (cm) 3.4 Depth: (cm) 0.8 Area: (cm) 8.812 Volume: (cm) 7.05 % Reduction in Area: -112.1% % Reduction in Volume: -1598.8% Epithelialization: None Tunneling: No Undermining: Yes Starting Position (o'clock): 12 Ending Position (o'clock): 6 Maximum Distance: (cm) 0.8 Wound Description Classification: Grade 2 Wound Margin: Thickened Exudate Amount: Medium Exudate Type: Serosanguineous Exudate Color: red, brown Foul Odor After Cleansing: No Slough/Fibrino No Wound Bed Granulation Amount: Large (67-100%) Exposed Structure Granulation Quality: Red, Pink, Hyper-granulation Fascia Exposed: No Necrotic Amount: None Present (0%) Fat Layer (Subcutaneous Tissue) Exposed: Yes Tendon Exposed: No Muscle Exposed: No Joint Exposed: No Bone Exposed: No Periwound Skin Texture Texture Color No Abnormalities Noted: No No Abnormalities Noted: No Callus: Yes Atrophie  Blanche: No Crepitus: No Cyanosis: No Excoriation: No Ecchymosis: No Induration: No Erythema: No Rash: No Hemosiderin Staining: No Scarring: No Mottled: No Pallor: No Moisture Rubor: No No Abnormalities Noted: No Dry / Scaly: No Temperature / Pain Maceration: No Temperature: No Abnormality Treatment Notes Wound #5 (Foot) Wound Laterality: Plantar, Left Cleanser Wound Cleanser Discharge Instruction: Cleanse the wound with wound cleanser prior to applying a clean dressing using gauze sponges, not tissue or cotton balls. Peri-Wound Care Topical blastx Discharge Instruction: may combine the topical antibiotics. topical antibiotics Discharge Instruction: Mix with Blastx and apply directly to wound bed. Primary Dressing KerraCel Ag Gelling Fiber Dressing, 4x5 in (silver alginate) Discharge Instruction: Apply silver alginate over the Blastx and topical antibiotics. [name], [name] ([id number]) [id number] Page 7 of 7 Secondary Dressing ABD Pad, 5x9 Discharge Instruction: Apply over primary dressing as directed. Drawtex 4x4 in Discharge Instruction: cut to into wound bed over the alginate. Secured With [employer], 4.5x3.1 (in/yd) Discharge Instruction: Secure with Kerlix as directed. 24M Medipore H Soft Cloth Surgical T ape, 4 x 10 (in/yd) Discharge Instruction: Secure with tape as directed. Compression Wrap Compression Stockings Add-Ons Electronic Signature(s) Signed: 09/30/2022 4:55:27 PM By: [name] Pilling RN, BSN Entered By: [name] on 09/30/2022 12:46:50 -------------------------------------------------------------------------------- Vitals Details Patient Name: Date of Service: [name] 09/30/2022 12:30 PM Medical Record Number: [id number] Patient Account Number: [id number] Date of Birth/Sex: Treating RN: 1961-02-05 (61 y.o. [name] Primary Care Bettyann Birchler: [name] Other Clinician: Referring Nicoles Sedlacek: Treating  Malika Demario/Extender: [name] in Treatment: 21 Vital Signs Time Taken: 12:45 Temperature (F): 97.9 Height (in): 73 Pulse (bpm): 90 Weight (lbs): 237 Respiratory Rate (breaths/min): 20 Body Mass Index (BMI): 31.3 Blood Pressure (mmHg): 124/80 Reference Range: 80 - 120 mg / dl Electronic Signature(s) Signed: 09/30/2022 4:55:27 PM By: [name] Pilling RN, BSN Entered By: [name] on 09/30/2022 12:46:35

## 2022-09-30 NOTE — Progress Notes (Signed)
[name], [name] ([id number]) [id number] Page 1 of 13 Visit Report for 09/30/2022 Chief Complaint Document Details Patient Name: Date of Service: [name] 09/30/2022 12:30 PM Medical Record Number: [id number] Patient Account Number: [id number] Date of Birth/Sex: Treating RN: 05-25-1961 (61 y.o. M) Primary Care Provider: [name] Other Clinician: Referring Provider: Treating Provider/Extender: [name] in Treatment: 21 Information Obtained from: Patient Chief Complaint Left foot ulcer Electronic Signature(s) Signed: 09/30/2022 5:16:42 PM By: [name] DO Entered By: [name] on 09/30/2022 12:58:27 -------------------------------------------------------------------------------- HPI Details Patient Name: Date of Service: [name], [name] IG 09/30/2022 12:30 PM Medical Record Number: [id number] Patient Account Number: [id number] Date of Birth/Sex: Treating RN: [birth year] (61 y.o. M) Primary Care Provider: [name] Other Clinician: Referring Provider: Treating Provider/Extender: [name] in Treatment: 21 History of Present Illness HPI Description: 10/31/2019 upon evaluation today patient appears to be doing somewhat poorly in regard to his bilateral plantar feet. He has wounds that he tells me have been present since 2012 intermittently off and on. Most recently this has been open for at least the past 6 months to a year. He has been trying to treat this in different ways using Santyl along with various other dressings including Medihoney and even at one point Xeroform. Nothing really has seem to get this completely closed. He was recently in the hospital for cellulitis of his leg subsequently he did have x-rays as well as MRIs that showed negative for any signs of osteomyelitis in regard to the wounds on his feet. Fortunately there is no signs of systemic infection at this time. No  fevers, chills, nausea, vomiting, or diarrhea. Patient has previously used Darco offloading shoes as far as frontal floaters as well as postop surgical shoes. He has never been in a total contact cast that may be something we need to strongly consider here. Patient's most recent hemoglobin A1c 1 month ago was 5.3 seems to be very well controlled which is great. Subsequently he has seen vascular as well as podiatry. His ABIs are 1.07 on the left and 1.14 on the right he seems to be doing well he does have chronic venous stasis. 11/07/2019 upon evaluation today patient appears to be doing well with regard to his wounds all things considered. I do not see any severe worsening he still has some callus buildup on the right more than the left he notes that he has been probably more active than he should as far as walking is concerned is just very hard to not be active. He knows he needs to be more careful in this regard however. He is willing to give the cast a try at this point although he notes that he is a little nervous about this just with regard to balance although he will be very careful and obviously if he has any trouble he knows to contact the office and let me know. 1/22; patient is in for his obligatory first total contact cast change. Our intake nurse reported a very large amount of drainage which is spelled out over to the surrounding skin. Has bilateral diabetic foot wounds. He has Charcot feet. We have been using silver alginate on his wounds. 11/14/2019 on evaluation today patient is actually seeming to make good improvement in regard to his bilateral plantar foot wounds. We have been using a cast on the left side and on the right side he has been using dressings he is changing up his own accord. With that being said  he tells me that he is also not walking as much just due to how unsteady he feels. He takes it easy when he does have to walk and when he does not have to walk he is resting. This  is probably help in his right foot as well has the left foot which is actually measuring better. In fact both are measuring better. Overall I am very pleased with how things seem to be progressing. The patient does have some odor on the left foot this does have me concerned about the possibility of infection, and actually probably go ahead and put him on antibiotics today as well as utilizing a continuation of the cast on the left foot I think that will be fine we probably just need to bring him in sooner to change this not last a whole week. 1/29; we brought the patient back today for a total contact cast change on the left out of concern for excessive drainage. We are using drawtex over the wound as the primary dressing 11/21/2019 on evaluation today patient appears to be doing well with regard to his left plantar foot. In fact both foot ulcers actually seem to be doing pretty well. Nonetheless he is having a lot of drainage on the left at this time and again we did obtain a wound culture did show positive for Staph aureus that was reviewed by myself today as well. Nonetheless he is on Bactrim which was shown to be sensitive that should be helping in this regard. Fortunately there is no signs of LUCIANO, CINQUEMANI ([id number]) [id number] Page 2 of 13 infection systemically at this point. 2/5; back in clinic today for a total contact cast change apparently secondary to very significant drainage. Still using drawtex 11/28/2019 upon evaluation today patient appears to be doing well with regard to his wounds. The right foot is doing okay as measured about the same in my opinion. The left foot is actually showing signs of significant improvement is measuring smaller there is a lot of hyper granulation likely due to the continued drainage at this point. We did obtain approval for a snap VAC I think that is good to be appropriate for him and will likely help this tremendously underneath  the cast. He is definitely in agreement with proceeding with such. 2/12; patient came in today after his snap VAC lost suction. Brought in to see one of our nurses. The dressing was replaced and then we put the cast back on and rehooked up the snap VAC. Apparently his wound looked very good per our intake nurse. 2/15; again we replaced the cast on Friday. By Saturday the snap VAC and light suction. He called this morning he comes in acutely. The wounds look fine however the VAC is not functioning. We replaced the cast using silver alginate as the primary dressing backed with Kerramax. The snap VAC was not replaced 12/05/2019 upon evaluation today patient appears to be doing better in regard to his left plantar foot ulcer. Fortunately there is no signs of active infection at this time. Unfortunately he is continuing to have issues with the right foot he is really not making any progress here things seem to be somewhat stagnant to be honest. The depth has increased but that is due to me having debrided the wound in the past based on what I am seeing. 12/12/2019 on evaluation today patient appears to be doing more poorly in regard to the left lower extremity. He has some erythema spreading up the  side of his foot I am concerned about infection again at this point. Unfortunately he has been seeing improvement with a total contact cast but I do not think we should put that on today. On his right plantar foot he continues to have significant drainage this is actually measuring deeper I really do not feel like you are making any progress whatsoever. I have prescribed Granix for him unfortunately his insurance apparently was going to cost him a $500 co-pay. 12/19/2019 upon evaluation today patient actually appears to be doing better in regard to both wounds. With that being said he actually did get the reGranix which he had to pay $500 for. With that being said it does look like that he is actually made some  improvement based on what I am seeing at this point with the reGranix. Obviously if he is going to continue this we are going to do something about trying to get him some help in covering the cost. 12/26/2019 on evaluation today patient appears to be doing really much better even compared to last week. Overall the wound seems to be much better even compared to last week and last week was better than the week before. Since has been using the reGranix his symptoms have improved significantly. With that being said the issue right now is simply that this is a very expensive medication for him the first dose cost him $500. Upon inspection patient's wound bed actually is however dramatically improved compared to before he started this 2 weeks ago. 01/02/2020 upon evaluation today patient appears to actually be doing well. He still had a little bit of reGranix left that has been using in small amounts he just been applying it every other day instead of every day in order to make it go longer. Overall we are still seeing excellent improvement he is measuring smaller looking better healthier tissue and everything seems to be pointing to this headed in the right direction. Fortunately there is no evidence of infection either which is also excellent news. He does have his MRI coming up within the next week. 01/09/2020 upon evaluation today patient appears to be doing a little worse today compared to previous week's evaluation. He is actually been out of the reGranix at this point. He has been trying to make the stretch out so he has been changing the dressings on a regular set schedule like he was previous. I think this has made a difference. Fortunately there is no signs of active infection at this time. No fevers, chills, nausea, vomiting, or diarrhea. 01/16/2020 upon evaluation today patient appears to be doing well with regard to his left plantar foot ulcer. The right plantar foot still shows some  significant depth at this point. Fortunately there is no signs of active infection at this time. 01/30/2020 upon evaluation today patient appears to be doing about the same in regard to his right plantar foot ulcer there is still some depth here and we had to wait till he actually switched over to his new insurance to get approval for the MRI under his new insurance plan. With that being said he now has switched as of April 1. Fortunately there is no signs of active infection at this time. Overall in regard to his left foot ulcer this seems to be doing much better and I am actually very pleased with how things are going. With that being said it is not quite as much progress as we were seeing with the reGranix but at  the same time he has had trouble getting this apparently there is been some hindrance here. I Ernie Hew try to actually send this to [facility] that was recommended by the drug rep to me. 02/13/20 upon evaluation today patient appears to be doing better in regard to his left foot ulcer this is great news. Unfortunately the right foot ulcer is not really significantly better at this time. There is no signs of active infection systemically though he did have his MRI which showed unfortunately he does have infection noted including an abscess in the foot. There is also marrow changes noted which are consistent with osteomyelitis based on the radiology review and interpretation. Unfortunately considering that the wound is not really making the progress that we will he would like to be seen I think that this is an indication that he may need some further referral both infectious disease as well as potentially to podiatrist to see if there is anything that can be done to help with the situation that were dealing with here. The left foot again is doing great. READMISSION 06/04/2021 This is a 61 year old man who was in the clinic in 2021 followed by Allen Derry for areas on the right and left foot.  He developed a left foot infection and was referred to ID. He left the clinic in a nonhealed state and was followed for a period of [facility] Marylene Land. Apparently things really deteriorated in early July when he was admitted to hospital from 04/27/2021 through 05/06/2021 with sepsis secondary to a left foot infection. His blood cultures were negative. An MRI suggested fifth metatarsal osteomyelitis a left ankle septic joint. He was treated with vancomycin and ertapenem which he is still taking and may just about be finishing. He was seen by orthopedics and the patient adamantly refused to BKA. As far as I can tell he did not have the ankle aspirated I am not exactly sure what the issue was here. Since he has been discharged she is at [facility] health care for rehabilitation. He was last seen by Dr. Algis Liming on 05/20/2021 he noted osteo of the tibial talar bone cuboid and fifth metatarsal which is even more extensive than what was suggested by the MRI. He is apparently going for a consultation with orthopedic surgery in [place] sometime next week. I received a call about this man 2 weeks ago from Dr. Allyson Sabal who follows him for the possibility of PAD. ABIs I think done in the office showed a ABI on the right of 1.05 at the PTA and 0.99 at the PTA on the left. He had a DVT rule out in the left leg that was negative for the DVT. Past medical history is extensive and includes diastolic heart failure, right first metatarsal head ulcer in 2021, excision of the right second ray by Dr. Marylene Land on 03/13/2020, hypertension, hypothyroidism. Left total hip replacement, right total knee replacement, carpal tunnel syndrome, obstructive sleep apnea alcohol abuse with cirrhosis although the patient denies current alcohol intake. The patient does not think he is a diabetic however looking through [place] link I see 2 HgbAic's of this year that were greater than equal to 6.5 which by definition makes him  diabetic. Nevertheless he is not on any treatment and does not check his blood sugars. The patient is now back home out of the nursing home. Saw Dr. Algis Liming last week he was taken off vancomycin and ertapenem on August 22 and now is on doxycycline on  Augmentin. He also saw Dr. Weston Anna who is his orthopedic surgeon in [place] he recommended a [employer]	[place]. He has been using Medihoney. 9/6I have been having trouble getting hyperbarics approved through our prior authorization process. Even though he had a limb threatening infection in the left foot and probably the left ankle there glitches in how some of the reports are worded also some of the consultants. In any case I am going to repeat his sedimentation rate and C-reactive protein. I am generally not in favor of doing things like this as they really do not alter the plan of care from my point of view however I am going to need to demonstrate that these remain high in order to get this through forhyperbaric treatment for chronic refractory osteomyelitis 9/13; following this man for a wound on his left plantar foot in the setting of type 2 diabetes and Charcot deformity. He has underlying chronic refractory osteomyelitis. Follow-up sedimentation rate and C-reactive protein were both elevated but the C-reactive protein was down to 1.4, sedimentation rate at 70. Sedimentation rate was only slightly down from previous at 85. His wound is measuring slightly smaller. 9/20; patient started hyperbaric oxygen therapy today and tolerated treatment well. This is for the underlying osteomyelitis. He remains on antibiotics but thinks he is getting close to finishing. The wound on the plantar aspect of his foot is the other issue we are following here. He is using Medihoney The patient has a Charcot foot in the setting of type 2 diabetes. He is going to need a total contact cast although his partner was away this week and we elected to delay this till next  week ALYAS, CREARY ([id number]) [id number] Page 3 of 13 9/27; patient still tolerating hyperbaric oxygen well. Wound looked generally healthy not much depth under illumination still 100% covered in fibrinous debris. Raised callused edges around the wound he was prepared for a total contact cast. We have been using Medihoney 9/30; patient is back for his first obligatory total contact cast change. We are using Hydrofera Blue. Noted by our intake nurse to have a lot of drainage or at least a moderate amount of drainage. I am not sure I was previously aware of this 10/4; patient arrives today with a lot of drainage under the cast. When he had it changed last Friday there was also a similar amount of drainage. Her intake nurse says that they tried a wound VAC on him perhaps while I was on vacation in August under the cast but that did not work. In my experience that has not been unusual. We have been using Hydrofera Blue with all the secondary absorbers. The drainage today went right through all of our dressings. The patient is concerned about his foot being in a cast without much drainage. He is tolerating HBO well. There has been improvements in the wound in the mid part of his foot in the setting of a Charcot deformity 10/7; patient presents for cast change. He has no issues or complaints today. He denies signs of infection. 10/11; patient presents for cast change. At this time he would like to take a break from the cast. He would like to do daily dressing changes with Hydrofera Blue. He currently denies signs of infection. 10/14; his cast was taken off last week at his request. He arrives in the clinic with [facility]. He has been changing the dressing himself. He has way too much edema in the left foot and leg to consider a  total contact cast. I do not really know the issue here. He does have chronic venous insufficiency His wife stopped me in the clinic earlier in  the week to report he is drinking again and she is concerned. I am uncertain whether there are other issues 10/18; he arrived in clinic last week having bilateral lower extremity edema likely secondary to chronic venous insufficiency there was too much edema in the left leg to apply a total contact cast. I put him in compression on the left leg to control the swelling. This week he cut the wrap on the foot for reasons that are unclear however today he arrives in clinic with a smaller left leg but massive edema in the left foot. He is supposed to be wearing a juxta lite stocking on the right leg but he is not wearing the contact layer. His attendance at hyperbaric oxygen has been dwindling, he did not dive yesterday and he did not dive today concerned about hyperbarics causing swelling. I looked back in his record his last echo was in 2020 essentially normal left and right ventricular function. Last BUN and creatinine were done in July this was normal. He is having a lot of drainage in the left plantar foot wound 10/25; again he comes in today having missed HBO yesterday. Macerated skin around the wound which really does not look very good at all. A lot of edema in the left foot but an improvement in edema on the left leg. We wrapped him last week because of the amount of swelling in the left leg. We could not apply a total contact cast. The patient states that he wants to be able to change his dressing himself. I might consider this if he had stockings to control the swelling. He said he be here for HBO tomorrow. I will check the degree of erythema in his forefoot which I have marked. He is on doxycycline and Augmentin which was renewed by Dr. Algis LimingVandam but I cannot see a follow-up note, follow-up inflammatory markers etc. I 1 point he said he was going to see his orthopedic surgeon in [place] I am not sure if he ever did this. I do not know why he has not followed up with infectious disease, he says he  was not given an appointment but he is still on Augmentin doxycycline 11/1; he did not do the lab work I ordered last week. Still on Augmentin and doxycycline. Silver alginate and he is changing this daily using his own juxta lite stocking. The surface of the wound does not look too bad. No real epithelialization however. The patient was seen today along with HBO 08/26/2021 upon evaluation today patient is being seen at his request by myself he wanted to transfer care to see me. With that being said he has been seeing Dr. Leanord Hawkingobson since he came back in August of this year. Unfortunately he tells me he still having quite a bit of drainage. He is also significant erythema and warmth noted of the foot as well. He has been hit or miss with regard to hyperbaric oxygen therapy tells me that at this point he took this week off because he was extremely claustrophobic and having a lot of issues he plans to start back next week. Nonetheless he has been given some medication by Dr. Leanord Hawkingobson as well to help calm things down which again may help him. Hopefully he can get back in hyperbarics as I think this is likely necessary. Nonetheless there does  seem to be evidence of cellulitis noted today as well and in general I am a little concerned in that regard. His wound unfortunately is significant on this left foot and is right where he takes the brunt of the force secondary to the Charcot arthropathy. I do not think a total contact cast is ideal due to the fact that he unfortunately is draining much too significantly he is also not happy with the idea of using a cast therefore he states he would not want to do that anyway. 09/16/2021 upon evaluation today patient's plantar foot ulcer unfortunately continues to show signs of issues here. He has been in the hospital due to trying to detox himself from alcohol he had withdrawal symptoms and subsequently was hospitalized. During that time it was recommended by both orthopedics  as well as infectious disease apparently that he proceed with amputation. With that being said they discussed with him the risk of not doing so. This is well- documented in the encounter. With that being said the patient does not want to proceed down that road and tells me that he declined their advice in that regard. Subsequently our goal then is to try to do what we can to try to get this thing healed and closed. I think this means he is getting need to stay off of it is much as possible he does have a wheelchair at home that he tells me he can use I think that that is going to be something that he does need to do. 09/23/2021 upon evaluation today patient continues to have significant issues here with his foot ulcer which is plantar on the left foot. Subsequently again he does have a history of Charcot foot he also has an issue of having had osteomyelitis as well as an open wound for some time here. Its been recommended multiple times for him to have an amputation although that is not something that he is really interested or wanting to do. 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 Subsequently this means that he also has a gait abnormality that is causing issues with his walking and causing abnormal pressures in the central part of his foot this is the reason he has a wound. Nonetheless I want to see what I can do about trying to get a boot to help with stabilization and offloading of working to see what we can do in that regard. 09/30/2021 upon evaluation today patient appears to be doing well with regard to his plantar foot ulcer. He still has significant granular hypergranulation but again this is much less than what it was even last time I saw him last week. Fortunately I think we are headed in the right direction. This is definitely something we could consider a skin graft or something along those lines if we can get it flattened out enough and get  the drainage from being so significant. I feel like we are making some headway here. 10/07/2021 upon evaluation today patient appears to be doing decently well in regard to his wound. Fortunately there does not appear to be any signs of infection overall I feel like it is actually showing some signs of improvement. He still has some hypergranulation but this is much less than its been. 10/21/2021 upon evaluation today patient appears to be doing well with regard to his wound is actually showing some signs of improvement which is great. We have been doing the chemical cauterization with silver  nitrate as well as debridement as needed and again has been using silver alginate up to this point. With that being said he does have some Hydrofera Blue at home he wonders if that can be beneficial at this point. 11/25/2021 upon evaluation today patient appears to be doing somewhat poorly in regard to his plantar foot ulcer. He is also been having some issues with his congestive heart failure. He has recently been in the hospital. I did review those notes as well and apparently his heart failure has been somewhat out of control. He is actually being managed as an outpatient in this regard but nonetheless this is something that can still be kept a close eye on according to the notes and what I see. Fortunately I do not see any signs of active infection at this time which is great news. Nonetheless I am concerned about the boggy central portion of the wound which I think is going to likely open up in the near future. Readmission: 01-27-2022 patient presents for follow-up here in the clinic today. His last been November 25, 2021 since have seen him. At that time we just ordered an MRI fortunately the MRI did not show any obvious signs of osteomyelitis and there were some reactive changes which were questionable and could not be excluded. With that being said in the interim since have seen him back on February 8 things  have gotten worse from the standpoint of how the wound appears today. I do think that he is going to require potentially more debridement to clean this wound out than what I can even offer here in the clinic there is a tremendous amount of hypergranulation tissue which is not sufficient to grow new tissue over and honestly I think this is going to lead to a delay in healing if its not taking care of. I need to see if I can get him into see a surgeon to get an opinion on whether or not they could take him to the OR for an aggressive surgical debridement to clear out this hypergranulation tissue and achieve hemostasis which is can be the biggest issue with me here in the clinic as he does tend to bleed even with very light superficial debridements. The patient's not opposed to this and he states that if I have someone that like for him to see he would be happy to go. Otherwise his medical history really has not changed. He does tell me he is getting ready to go for an evaluation for CPAP machine. Fanny BienMILLER, Dajour ([id number]) [id number] Page 4 of 13 Readmission 05/04/2022 Patient was last seen 3 months ago. He states he went to rehab for alcohol abuse. He continues to have a wound on the plantar aspect of the left foot. He states this has never completely healed over the past several years. He is following with podiatry for this issue currently and they are recommending blast X with collagen and a cam boot. He has not started this yet. He has home health. Currently he is using silver alginate. He also follows with Dr. Algis LimingVandam infectious disease who ordered an MRI of the foot as he is concerned for osteomyelitis. CRP and sed rate are elevated. He is currently on Augmentin for prophylaxis as he has a history of osteomyelitis to this foot. Infectious disease has recommended amputation. Currently patient denies systemic signs of infection. 8/7; patient is following up with podiatry on  8/11. He wanted to come in to be debrided. He has no  issues or complaints today. He has been using silver alginate to the wound bed. He has home health that helps change the dressings. He had an MRI completed on 7/18 that did not show evidence of osteomyelitis. He is scheduled to see infectious disease on 8/16. 8/25; Patient is following with podiatry for his wound care and he is currently using silver alginate and a defender boot for his Left foot wound. He would like to have debridement to the wound but is unable to see podiatry until the end of the month. He has asked if we could debride him. 9/21; patient follows with podiatry for his foot wound but likes to come into our clinic for debridements. He was last seen on 06/24/2022 by Dr. Allena Katz, podiatry. He is currently using blast X to the wound bed. He has been using a Psychologist, forensic. He is not scheduled to see podiatry until 10/11 and comes in for debridement today. 10/9; patient presents for follow-up. He states he is no longer following with podiatry. He has been doing silver alginate to the wound bed. He states he would like to attempt a total contact cast but starting next week. He currently denies systemic signs of infection. 10/16; patient presents for follow-up. He has been using silver alginate to the wound bed. We discussed having the cast placed today and patient would like to proceed with this. He has follow-up in 2 days for his cast exchange. He currently denies signs of infection. 10/18; total contact cast placed 2 days ago. He comes back in for the obligatory total contact cast change HOWEVER our intake nurse noted copious drainage soaking right through the dressings and asked me to look at this. Indeed there was a large amount of drainage soaking right through all of the dressings that were applied. I cannot imagine that this would last 7 days. Even if we change the cast twice a week I would be concerned. We worked on this patient  through the late part of 2022 in the early 2023 for this same wound. We attempted a cast at that time I think even a cast with a wound VAC and for 1 reason or another we could never make this progress towards closure. Fortunately his recent MRI did not show osteomyelitis. 10/26; patient presents for follow-up. He has been using silver alginate to the wound bed. He has no issues or complaints today. 11/3; wound edges are macerated secondary to copious drainage. PCR culture came back showing MRSA this has been sent to [facility] but we do not yet have that available.. Nursing reports still copious drainage 11/17; patient presents for follow-up. He states he received Keystone antibiotic ointment last week and has been using this With silver alginate. He reports improvement in drainage. He has no issues or complaints today. 12/1; patient presents for follow-up. He has been using Keystone antibiotic ointment with silver alginate. He reports continued improvement in drainage. We discussed the total contact cast today. For now he would like to hold off until later in the month. 12/14; patient presents for follow-up. He has been using Keystone antibiotic ointment with blast X and silver alginate. He states he would like to defer the total contact cast until the first of the year. He has been using his Psychologist, forensic. He currently denies signs of infection. He reports low amount of drainage. Electronic Signature(s) Signed: 09/30/2022 5:16:42 PM By: [name] DO Entered By: [name] on 09/30/2022 12:59:46 -------------------------------------------------------------------------------- Chemical Cauterization Details Patient Name: Date of Service:  Wey, [name] 09/30/2022 12:30 PM Medical Record Number: [id number] Patient Account Number: [id number] Date of Birth/Sex: Treating RN: Sep 13, 1961 (61 y.o. [name] Primary Care Provider: [name] Other Clinician: Referring  Provider: Treating Provider/Extender: [name] in Treatment: 21 Procedure Performed for: Wound #5 Left,Plantar Foot Performed By: Physician [name], DO Post Procedure Diagnosis Same as Pre-procedure Electronic Signature(s) Signed: 09/30/2022 5:16:09 PM By: Fonnie Mu RN Signed: 09/30/2022 5:16:42 PM By: [name] DO Entered By: Fonnie Mu on 09/30/2022 12:56:45 Lender, Adarrius ([id number]) [id number] Page 5 of 13 -------------------------------------------------------------------------------- Physical Exam Details Patient Name: Date of Service: [name], [name] 09/30/2022 12:30 PM Medical Record Number: [id number] Patient Account Number: [id number] Date of Birth/Sex: Treating RN: [birth year] (62 y.o. M) Primary Care Provider: [name] Other Clinician: Referring Provider: Treating Provider/Extender: [name] in Treatment: 21 Constitutional respirations regular, non-labored and within target range for patient.. Cardiovascular 2+ dorsalis pedis/posterior tibialis pulses. Psychiatric pleasant and cooperative. Notes T the left plantar midfoot there is an open wound with granulation..Undermining from 12-8 o'clock. No signs of surrounding infection. o Electronic Signature(s) Signed: 09/30/2022 5:16:42 PM By: [name] DO Entered By: [name] on 09/30/2022 13:00:13 -------------------------------------------------------------------------------- Physician Orders Details Patient Name: Date of Service: [name], [name] IG 09/30/2022 12:30 PM Medical Record Number: [id number] Patient Account Number: [id number] Date of Birth/Sex: Treating RN: October 01, 1961 (61 y.o. [name] Primary Care Provider: [name] Other Clinician: Referring Provider: Treating Provider/Extender: [name] in Treatment: 2 Verbal / Phone Orders:  No Diagnosis Coding Follow-up Appointments Return appointment in 3 weeks. - w/ Dr. Mikey Bussing Other: - continue topical antibiotics Keystone Antibiotics. hold on the total contact cast till after traveling for the holidays Anesthetic (In clinic) Topical Lidocaine 4% applied to wound bed Cellular or Tissue Based Products Cellular or Tissue Based Product Type: - Run IVR for Grafix=20% Copay Bathing/ Shower/ Hygiene May shower with protection but do not get wound dressing(s) wet. - May use cast protector bag from [facility], [employer] or [facility] Edema Control - Lymphedema / SCD / Other Elevate legs to the level of the heart or above for 30 minutes daily and/or when sitting, a frequency of: - throughout the day Avoid standing for long periods of time. Exercise regularly Moisturize legs daily. Compression stocking or Garment 30-40 mm/Hg pressure to: - wear the Juxtalite HD to right and left leg. apply in the morning and remove at night. Off-Loading Other: - Defender Boot: minimal weight bearing left foot use wheelchair for mobility. GEAN, LAROSE ([id number]) [id number] Page 6 of 13 Additional Orders / Instructions Follow Nutritious Diet Wound Treatment Wound #5 - Foot Wound Laterality: Plantar, Left Cleanser: Wound Cleanser 1 x Per Day/30 Days Discharge Instructions: Cleanse the wound with wound cleanser prior to applying a clean dressing using gauze sponges, not tissue or cotton balls. Topical: blastx 1 x Per Day/30 Days Discharge Instructions: may combine the topical antibiotics. Topical: topical antibiotics 1 x Per Day/30 Days Discharge Instructions: Mix with Blastx and apply directly to wound bed. Prim Dressing: KerraCel Ag Gelling Fiber Dressing, 4x5 in (silver alginate) 1 x Per Day/30 Days ary Discharge Instructions: Apply silver alginate over the Blastx and topical antibiotics. Secondary Dressing: ABD Pad, 5x9 1 x Per Day/30 Days Discharge Instructions: Apply  over primary dressing as directed. Secondary Dressing: Drawtex 4x4 in 1 x Per Day/30 Days Discharge Instructions: cut to into wound bed over the alginate. Secured With: [employer], 4.5x3.1 (in/yd) 1 x Per Day/30  Days Discharge Instructions: Secure with Kerlix as directed. Secured With: 73M Medipore H Soft Cloth Surgical T ape, 4 x 10 (in/yd) 1 x Per Day/30 Days Discharge Instructions: Secure with tape as directed. Electronic Signature(s) Signed: 09/30/2022 5:16:42 PM By: [name] DO Entered By: [name] on 09/30/2022 13:00:23 -------------------------------------------------------------------------------- Problem List Details Patient Name: Date of Service: [name], [name] IG 09/30/2022 12:30 PM Medical Record Number: [id number] Patient Account Number: [id number] Date of Birth/Sex: Treating RN: 08-Oct-1961 (61 y.o. M) Primary Care Provider: [name] Other Clinician: Referring Provider: Treating Provider/Extender: [name] in Treatment: 21 Active Problems ICD-10 Encounter Code Description Active Date MDM Diagnosis E11.621 Type 2 diabetes mellitus with foot ulcer 05/04/2022 No Yes L97.522 Non-pressure chronic ulcer of other part of left foot with fat layer exposed 05/04/2022 No Yes M86.372 Chronic multifocal osteomyelitis, left ankle and foot 05/04/2022 No Yes M14.672 Charcot's joint, left ankle and foot 05/04/2022 No Yes Inactive Problems MARTIS, Lyfe ([id number]) [id number] Page 7 of 13 Resolved Problems Electronic Signature(s) Signed: 09/30/2022 5:16:42 PM By: [name] DO Entered By: [name] on 09/30/2022 12:57:59 -------------------------------------------------------------------------------- Progress Note Details Patient Name: Date of Service: [name], [name] IG 09/30/2022 12:30 PM Medical Record Number: [id number] Patient Account Number: [id number] Date of Birth/Sex: Treating  RN: July 02, 1961 (61 y.o. M) Primary Care Provider: [name] Other Clinician: Referring Provider: Treating Provider/Extender: [name] in Treatment: 21 Subjective Chief Complaint Information obtained from Patient Left foot ulcer History of Present Illness (HPI) 10/31/2019 upon evaluation today patient appears to be doing somewhat poorly in regard to his bilateral plantar feet. He has wounds that he tells me have been present since 2012 intermittently off and on. Most recently this has been open for at least the past 6 months to a year. He has been trying to treat this in different ways using Santyl along with various other dressings including Medihoney and even at one point Xeroform. Nothing really has seem to get this completely closed. He was recently in the hospital for cellulitis of his leg subsequently he did have x-rays as well as MRIs that showed negative for any signs of osteomyelitis in regard to the wounds on his feet. Fortunately there is no signs of systemic infection at this time. No fevers, chills, nausea, vomiting, or diarrhea. Patient has previously used Darco offloading shoes as far as frontal floaters as well as postop surgical shoes. He has never been in a total contact cast that may be something we need to strongly consider here. Patient's most recent hemoglobin A1c 1 month ago was 5.3 seems to be very well controlled which is great. Subsequently he has seen vascular as well as podiatry. His ABIs are 1.07 on the left and 1.14 on the right he seems to be doing well he does have chronic venous stasis. 11/07/2019 upon evaluation today patient appears to be doing well with regard to his wounds all things considered. I do not see any severe worsening he still has some callus buildup on the right more than the left he notes that he has been probably more active than he should as far as walking is concerned is just very hard to not be active. He knows he  needs to be more careful in this regard however. He is willing to give the cast a try at this point although he notes that he is a little nervous about this just with regard to balance although he will be very careful and obviously if he has  any trouble he knows to contact the office and let me know. 1/22; patient is in for his obligatory first total contact cast change. Our intake nurse reported a very large amount of drainage which is spelled out over to the surrounding skin. Has bilateral diabetic foot wounds. He has Charcot feet. We have been using silver alginate on his wounds. 11/14/2019 on evaluation today patient is actually seeming to make good improvement in regard to his bilateral plantar foot wounds. We have been using a cast on the left side and on the right side he has been using dressings he is changing up his own accord. With that being said he tells me that he is also not walking as much just due to how unsteady he feels. He takes it easy when he does have to walk and when he does not have to walk he is resting. This is probably help in his right foot as well has the left foot which is actually measuring better. In fact both are measuring better. Overall I am very pleased with how things seem to be progressing. The patient does have some odor on the left foot this does have me concerned about the possibility of infection, and actually probably go ahead and put him on antibiotics today as well as utilizing a continuation of the cast on the left foot I think that will be fine we probably just need to bring him in sooner to change this not last a whole week. 1/29; we brought the patient back today for a total contact cast change on the left out of concern for excessive drainage. We are using drawtex over the wound as the primary dressing 11/21/2019 on evaluation today patient appears to be doing well with regard to his left plantar foot. In fact both foot ulcers actually seem to be doing  pretty well. Nonetheless he is having a lot of drainage on the left at this time and again we did obtain a wound culture did show positive for Staph aureus that was reviewed by myself today as well. Nonetheless he is on Bactrim which was shown to be sensitive that should be helping in this regard. Fortunately there is no signs of infection systemically at this point. 2/5; back in clinic today for a total contact cast change apparently secondary to very significant drainage. Still using drawtex 11/28/2019 upon evaluation today patient appears to be doing well with regard to his wounds. The right foot is doing okay as measured about the same in my opinion. The left foot is actually showing signs of significant improvement is measuring smaller there is a lot of hyper granulation likely due to the continued drainage at this point. We did obtain approval for a snap VAC I think that is good to be appropriate for him and will likely help this tremendously underneath the cast. He is definitely in agreement with proceeding with such. 2/12; patient came in today after his snap VAC lost suction. Brought in to see one of our nurses. The dressing was replaced and then we put the cast back on and rehooked up the snap VAC. Apparently his wound looked very good per our intake nurse. 2/15; again we replaced the cast on Friday. By Saturday the snap VAC and light suction. He called this morning he comes in acutely. The wounds look fine however the VAC is not functioning. We replaced the cast using silver alginate as the primary dressing backed with Kerramax. The snap VAC was not replaced 12/05/2019 upon  evaluation today patient appears to be doing better in regard to his left plantar foot ulcer. Fortunately there is no signs of active infection at this time. Unfortunately he is continuing to have issues with the right foot he is really not making any progress here things seem to be somewhat stagnant to be honest. The  depth has increased but that is due to me having debrided the wound in the past based on what I am seeing. 12/12/2019 on evaluation today patient appears to be doing more poorly in regard to the left lower extremity. He has some erythema spreading up the side of his foot I am concerned about infection again at this point. Unfortunately he has been seeing improvement with a total contact cast but I do not think we should put that on today. On his right plantar foot he continues to have significant drainage this is actually measuring deeper I really do not feel like you are making any progress whatsoever. I have prescribed Granix for him unfortunately his insurance apparently was going to cost him a $500 co-pay. 12/19/2019 upon evaluation today patient actually appears to be doing better in regard to both wounds. With that being said he actually did get the reGranix which Kurten, [name] ([id number]) [id number] Page 8 of 13 he had to pay $500 for. With that being said it does look like that he is actually made some improvement based on what I am seeing at this point with the reGranix. Obviously if he is going to continue this we are going to do something about trying to get him some help in covering the cost. 12/26/2019 on evaluation today patient appears to be doing really much better even compared to last week. Overall the wound seems to be much better even compared to last week and last week was better than the week before. Since has been using the reGranix his symptoms have improved significantly. With that being said the issue right now is simply that this is a very expensive medication for him the first dose cost him $500. Upon inspection patient's wound bed actually is however dramatically improved compared to before he started this 2 weeks ago. 01/02/2020 upon evaluation today patient appears to actually be doing well. He still had a little bit of reGranix left that has been  using in small amounts he just been applying it every other day instead of every day in order to make it go longer. Overall we are still seeing excellent improvement he is measuring smaller looking better healthier tissue and everything seems to be pointing to this headed in the right direction. Fortunately there is no evidence of infection either which is also excellent news. He does have his MRI coming up within the next week. 01/09/2020 upon evaluation today patient appears to be doing a little worse today compared to previous week's evaluation. He is actually been out of the reGranix at this point. He has been trying to make the stretch out so he has been changing the dressings on a regular set schedule like he was previous. I think this has made a difference. Fortunately there is no signs of active infection at this time. No fevers, chills, nausea, vomiting, or diarrhea. 01/16/2020 upon evaluation today patient appears to be doing well with regard to his left plantar foot ulcer. The right plantar foot still shows some significant depth at this point. Fortunately there is no signs of active infection at this time. 01/30/2020 upon evaluation today patient appears to be doing  about the same in regard to his right plantar foot ulcer there is still some depth here and we had to wait till he actually switched over to his new insurance to get approval for the MRI under his new insurance plan. With that being said he now has switched as of April 1. Fortunately there is no signs of active infection at this time. Overall in regard to his left foot ulcer this seems to be doing much better and I am actually very pleased with how things are going. With that being said it is not quite as much progress as we were seeing with the reGranix but at the same time he has had trouble getting this apparently there is been some hindrance here. I Ernie Hew try to actually send this to [facility] that was recommended by the  drug rep to me. 02/13/20 upon evaluation today patient appears to be doing better in regard to his left foot ulcer this is great news. Unfortunately the right foot ulcer is not really significantly better at this time. There is no signs of active infection systemically though he did have his MRI which showed unfortunately he does have infection noted including an abscess in the foot. There is also marrow changes noted which are consistent with osteomyelitis based on the radiology review and interpretation. Unfortunately considering that the wound is not really making the progress that we will he would like to be seen I think that this is an indication that he may need some further referral both infectious disease as well as potentially to podiatrist to see if there is anything that can be done to help with the situation that were dealing with here. The left foot again is doing great. READMISSION 06/04/2021 This is a 61 year old man who was in the clinic in 2021 followed by Allen Derry for areas on the right and left foot. He developed a left foot infection and was referred to ID. He left the clinic in a nonhealed state and was followed for a period of [facility] Marylene Land. Apparently things really deteriorated in early July when he was admitted to hospital from 04/27/2021 through 05/06/2021 with sepsis secondary to a left foot infection. His blood cultures were negative. An MRI suggested fifth metatarsal osteomyelitis a left ankle septic joint. He was treated with vancomycin and ertapenem which he is still taking and may just about be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

## 2022-10-01 ENCOUNTER — Encounter (HOSPITAL_BASED_OUTPATIENT_CLINIC_OR_DEPARTMENT_OTHER): Payer: Medicare Other | Admitting: Internal Medicine

## 2022-10-21 ENCOUNTER — Encounter (HOSPITAL_BASED_OUTPATIENT_CLINIC_OR_DEPARTMENT_OTHER): Payer: Medicare Other | Attending: Internal Medicine | Admitting: Internal Medicine

## 2022-10-21 DIAGNOSIS — I110 Hypertensive heart disease with heart failure: Secondary | ICD-10-CM | POA: Insufficient documentation

## 2022-10-21 DIAGNOSIS — K7460 Unspecified cirrhosis of liver: Secondary | ICD-10-CM | POA: Diagnosis not present

## 2022-10-21 DIAGNOSIS — E1169 Type 2 diabetes mellitus with other specified complication: Secondary | ICD-10-CM | POA: Insufficient documentation

## 2022-10-21 DIAGNOSIS — M86372 Chronic multifocal osteomyelitis, left ankle and foot: Secondary | ICD-10-CM | POA: Diagnosis not present

## 2022-10-21 DIAGNOSIS — Z96651 Presence of right artificial knee joint: Secondary | ICD-10-CM | POA: Insufficient documentation

## 2022-10-21 DIAGNOSIS — E11610 Type 2 diabetes mellitus with diabetic neuropathic arthropathy: Secondary | ICD-10-CM | POA: Insufficient documentation

## 2022-10-21 DIAGNOSIS — E1151 Type 2 diabetes mellitus with diabetic peripheral angiopathy without gangrene: Secondary | ICD-10-CM | POA: Insufficient documentation

## 2022-10-21 DIAGNOSIS — Z96642 Presence of left artificial hip joint: Secondary | ICD-10-CM | POA: Insufficient documentation

## 2022-10-21 DIAGNOSIS — I872 Venous insufficiency (chronic) (peripheral): Secondary | ICD-10-CM | POA: Diagnosis not present

## 2022-10-21 DIAGNOSIS — E11621 Type 2 diabetes mellitus with foot ulcer: Secondary | ICD-10-CM | POA: Insufficient documentation

## 2022-10-21 DIAGNOSIS — G4733 Obstructive sleep apnea (adult) (pediatric): Secondary | ICD-10-CM | POA: Insufficient documentation

## 2022-10-21 DIAGNOSIS — E039 Hypothyroidism, unspecified: Secondary | ICD-10-CM | POA: Insufficient documentation

## 2022-10-21 DIAGNOSIS — I5032 Chronic diastolic (congestive) heart failure: Secondary | ICD-10-CM | POA: Diagnosis not present

## 2022-10-21 DIAGNOSIS — M1990 Unspecified osteoarthritis, unspecified site: Secondary | ICD-10-CM | POA: Insufficient documentation

## 2022-10-21 DIAGNOSIS — E1140 Type 2 diabetes mellitus with diabetic neuropathy, unspecified: Secondary | ICD-10-CM | POA: Diagnosis not present

## 2022-10-21 DIAGNOSIS — L97522 Non-pressure chronic ulcer of other part of left foot with fat layer exposed: Secondary | ICD-10-CM | POA: Diagnosis not present

## 2022-10-21 DIAGNOSIS — G5602 Carpal tunnel syndrome, left upper limb: Secondary | ICD-10-CM | POA: Diagnosis not present

## 2022-10-22 ENCOUNTER — Other Ambulatory Visit: Payer: Self-pay | Admitting: Rheumatology

## 2022-10-22 DIAGNOSIS — M549 Dorsalgia, unspecified: Secondary | ICD-10-CM

## 2022-10-22 NOTE — Progress Notes (Signed)
BECKAM, LOBELLO ([id number]) [id number] Page 1 of 14 Visit Report for 10/21/2022 Chief Complaint Document Details Patient Name: Date of Service: [name], [name] 10/21/2022 10:15 A M Medical Record Number: [id number] Patient Account Number: [id number] Date of Birth/Sex: Treating RN: 1961-02-11 (62 y.o. M) Primary Care Provider: Billey Gosling Other Clinician: Referring Provider: Treating Provider/Extender: Mickle Asper in Treatment: 24 Information Obtained from: Patient Chief Complaint Left foot ulcer Electronic Signature(s) Signed: 10/21/2022 2:54:58 PM By: Kalman Shan DO Entered By: Kalman Shan on 10/21/2022 12:31:25 -------------------------------------------------------------------------------- Debridement Details Patient Name: Date of Service: [name] 10/21/2022 10:15 A M Medical Record Number: [id number] Patient Account Number: [id number] Date of Birth/Sex: Treating RN: [birth year] (62 y.o. [name], [name] Primary Care Provider: Billey Gosling Other Clinician: Referring Provider: Treating Provider/Extender: Mickle Asper in Treatment: 24 Debridement Performed for Assessment: Wound #5 Left,Plantar Foot Performed By: Physician Kalman Shan, DO Debridement Type: Debridement Severity of Tissue Pre Debridement: Fat layer exposed Level of Consciousness (Pre-procedure): Awake and Alert Pre-procedure Verification/Time Out Yes - 11:07 Taken: Start Time: 11:07 Pain Control: Lidocaine T Area Debrided (L x W): otal 4 (cm) x 3.5 (cm) = 14 (cm) Tissue and other material debrided: Viable, Non-Viable, Callus, Slough, Subcutaneous, Slough Level: Skin/Subcutaneous Tissue Debridement Description: Excisional Instrument: Curette Bleeding: Minimum Hemostasis Achieved: Pressure End Time: 11:07 Procedural Pain: 0 Post Procedural Pain: 0 Response to Treatment: Procedure was tolerated well Level of  Consciousness (Post- Awake and Alert procedure): Post Debridement Measurements of Total Wound Length: (cm) 4 Width: (cm) 3.5 Depth: (cm) 2 Volume: (cm) 21.991 Character of Wound/Ulcer Post Debridement: Improved Severity of Tissue Post Debridement: Fat layer exposed Caraway, Melbeta ([id number] Page 2 of 14 Post Procedure Diagnosis Same as Pre-procedure Electronic Signature(s) Signed: 10/21/2022 2:54:58 PM By: Kalman Shan DO Signed: 10/22/2022 12:14:03 PM By: Rhae Hammock RN Entered By: Rhae Hammock on 10/21/2022 11:08:21 -------------------------------------------------------------------------------- HPI Details Patient Name: Date of Service: [name] 10/21/2022 10:15 A M Medical Record Number: [id number] Patient Account Number: [id number] Date of Birth/Sex: Treating RN: [birth year] (62 y.o. M) Primary Care Provider: Billey Gosling Other Clinician: Referring Provider: Treating Provider/Extender: Mickle Asper in Treatment: 24 History of Present Illness HPI Description: 10/31/2019 upon evaluation today patient appears to be doing somewhat poorly in regard to his bilateral plantar feet. He has wounds that he tells me have been present since 2012 intermittently off and on. Most recently this has been open for at least the past 6 months to a year. He has been trying to treat this in different ways using Santyl along with various other dressings including Medihoney and even at one point Xeroform. Nothing really has seem to get this completely closed. He was recently in the hospital for cellulitis of his leg subsequently he did have x-rays as well as MRIs that showed negative for any signs of osteomyelitis in regard to the wounds on his feet. Fortunately there is no signs of systemic infection at this time. No fevers, chills, nausea, vomiting, or diarrhea. Patient has previously used Darco offloading shoes as far as  frontal floaters as well as postop surgical shoes. He has never been in a total contact cast that may be something we need to strongly consider here. Patient's most recent hemoglobin A1c 1 month ago was 5.3 seems to be very well controlled which is great. Subsequently he has seen vascular as well as podiatry. His ABIs are 1.07 on the left and 1.14 on the right he seems to  be doing well he does have chronic venous stasis. 11/07/2019 upon evaluation today patient appears to be doing well with regard to his wounds all things considered. I do not see any severe worsening he still has some callus buildup on the right more than the left he notes that he has been probably more active than he should as far as walking is concerned is just very hard to not be active. He knows he needs to be more careful in this regard however. He is willing to give the cast a try at this point although he notes that he is a little nervous about this just with regard to balance although he will be very careful and obviously if he has any trouble he knows to contact the office and let me know. 1/22; patient is in for his obligatory first total contact cast change. Our intake nurse reported a very large amount of drainage which is spelled out over to the surrounding skin. Has bilateral diabetic foot wounds. He has Charcot feet. We have been using silver alginate on his wounds. 11/14/2019 on evaluation today patient is actually seeming to make good improvement in regard to his bilateral plantar foot wounds. We have been using a cast on the left side and on the right side he has been using dressings he is changing up his own accord. With that being said he tells me that he is also not walking as much just due to how unsteady he feels. He takes it easy when he does have to walk and when he does not have to walk he is resting. This is probably help in his right foot as well has the left foot which is actually measuring better. In fact  both are measuring better. Overall I am very pleased with how things seem to be progressing. The patient does have some odor on the left foot this does have me concerned about the possibility of infection, and actually probably go ahead and put him on antibiotics today as well as utilizing a continuation of the cast on the left foot I think that will be fine we probably just need to bring him in sooner to change this not last a whole week. 1/29; we brought the patient back today for a total contact cast change on the left out of concern for excessive drainage. We are using drawtex over the wound as the primary dressing 11/21/2019 on evaluation today patient appears to be doing well with regard to his left plantar foot. In fact both foot ulcers actually seem to be doing pretty well. Nonetheless he is having a lot of drainage on the left at this time and again we did obtain a wound culture did show positive for Staph aureus that was reviewed by myself today as well. Nonetheless he is on Bactrim which was shown to be sensitive that should be helping in this regard. Fortunately there is no signs of infection systemically at this point. 2/5; back in clinic today for a total contact cast change apparently secondary to very significant drainage. Still using drawtex 11/28/2019 upon evaluation today patient appears to be doing well with regard to his wounds. The right foot is doing okay as measured about the same in my opinion. The left foot is actually showing signs of significant improvement is measuring smaller there is a lot of hyper granulation likely due to the continued drainage at this point. We did obtain approval for a snap VAC I think that is good to be  appropriate for him and will likely help this tremendously underneath the cast. He is definitely in agreement with proceeding with such. 2/12; patient came in today after his snap VAC lost suction. Brought in to see one of our nurses. The dressing was  replaced and then we put the cast back on and rehooked up the snap VAC. Apparently his wound looked very good per our intake nurse. 2/15; again we replaced the cast on Friday. By Saturday the snap VAC and light suction. He called this morning he comes in acutely. The wounds look fine however the VAC is not functioning. We replaced the cast using silver alginate as the primary dressing backed with Kerramax. The snap VAC was not replaced 12/05/2019 upon evaluation today patient appears to be doing better in regard to his left plantar foot ulcer. Fortunately there is no signs of active infection at this time. Unfortunately he is continuing to have issues with the right foot he is really not making any progress here things seem to be somewhat stagnant to be honest. The depth has increased but that is due to me having debrided the wound in the past based on what I am seeing. 12/12/2019 on evaluation today patient appears to be doing more poorly in regard to the left lower extremity. He has some erythema spreading up the side of his foot I am concerned about infection again at this point. Unfortunately he has been seeing improvement with a total contact cast but I do not think we should put that on today. On his right plantar foot he continues to have significant drainage this is actually measuring deeper I really do not feel like you are making any progress whatsoever. I have prescribed Granix for him unfortunately his insurance apparently was going to cost him a $500 co-pay. 12/19/2019 upon evaluation today patient actually appears to be doing better in regard to both wounds. With that being said he actually did get the reGranix which he had to pay $500 for. With that being said it does look like that he is actually made some improvement based on what I am seeing at this point with the reGranix. Obviously if he is going to continue this we are going to do something about trying to get him some help in covering  the cost. [place], [name] ([name]) [id number] Page 3 of 14 12/26/2019 on evaluation today patient appears to be doing really much better even compared to last week. Overall the wound seems to be much better even compared to last week and last week was better than the week before. Since has been using the reGranix his symptoms have improved significantly. With that being said the issue right now is simply that this is a very expensive medication for him the first dose cost him $500. Upon inspection patient's wound bed actually is however dramatically improved compared to before he started this 2 weeks ago. 01/02/2020 upon evaluation today patient appears to actually be doing well. He still had a little bit of reGranix left that has been using in small amounts he just been applying it every other day instead of every day in order to make it go longer. Overall we are still seeing excellent improvement he is measuring smaller looking better healthier tissue and everything seems to be pointing to this headed in the right direction. Fortunately there is no evidence of infection either which is also excellent news. He does have his MRI coming up within the next week. 01/09/2020 upon evaluation today patient  appears to be doing a little worse today compared to previous week's evaluation. He is actually been out of the reGranix at this point. He has been trying to make the stretch out so he has been changing the dressings on a regular set schedule like he was previous. I think this has made a difference. Fortunately there is no signs of active infection at this time. No fevers, chills, nausea, vomiting, or diarrhea. 01/16/2020 upon evaluation today patient appears to be doing well with regard to his left plantar foot ulcer. The right plantar foot still shows some significant depth at this point. Fortunately there is no signs of active infection at this time. 01/30/2020 upon evaluation  today patient appears to be doing about the same in regard to his right plantar foot ulcer there is still some depth here and we had to wait till he actually switched over to his new insurance to get approval for the MRI under his new insurance plan. With that being said he now has switched as of April 1. Fortunately there is no signs of active infection at this time. Overall in regard to his left foot ulcer this seems to be doing much better and I am actually very pleased with how things are going. With that being said it is not quite as much progress as we were seeing with the reGranix but at the same time he has had trouble getting this apparently there is been some hindrance here. I Georgina Peer try to actually send [facility] to [facility] that was recommended by the drug rep to me. 02/13/20 upon evaluation today patient appears to be doing better in regard to his left foot ulcer this is great news. Unfortunately the right foot ulcer is not really significantly better at this time. There is no signs of active infection systemically though he did have his MRI which showed unfortunately he does have infection noted including an abscess in the foot. There is also marrow changes noted which are consistent with osteomyelitis based on the radiology review and interpretation. Unfortunately considering that the wound is not really making the progress that we will he would like to be seen I think that this is an indication that he may need some further referral both infectious disease as well as potentially to podiatrist to see if there is anything that can be done to help with the situation that were dealing with here. The left foot again is doing great. READMISSION 06/04/2021 This is a 62 year old man who was in the clinic in 2021 followed by Jeri Cos for areas on the right and left foot. He developed a left foot infection and was referred to ID. He left the clinic in a nonhealed state and was followed for a  period of [facility] Cannon Kettle. Apparently things really deteriorated in early July when he was admitted to hospital from 04/27/2021 through 05/06/2021 with sepsis secondary to a left foot infection. His blood cultures were negative. An MRI suggested fifth metatarsal osteomyelitis a left ankle septic joint. He was treated with vancomycin and ertapenem which he is still taking and may just about be finishing. He was seen by orthopedics and the patient adamantly refused to BKA. As far as I can tell he did not have the ankle aspirated I am not exactly sure what the issue was here. Since he has been discharged she is at [facility] for rehabilitation. He was last seen by Dr. Drucilla Schmidt on 05/20/2021 he noted osteo  of the tibial talar bone cuboid and fifth metatarsal which is even more extensive than what was suggested by the MRI. He is apparently going for a consultation with orthopedic surgery in [place] sometime next week. I received a call about this man 2 weeks ago from Dr. Gwenlyn Found who follows him for the possibility of PAD. ABIs I think done in the office showed a ABI on the right of 1.05 at the PTA and 0.99 at the PTA on the left. He had a DVT rule out in the left leg that was negative for the DVT. Past medical history is extensive and includes diastolic heart failure, right first metatarsal head ulcer in 2021, excision of the right second ray by Dr. Cannon Kettle on 03/13/2020, hypertension, hypothyroidism. Left total hip replacement, right total knee replacement, carpal tunnel syndrome, obstructive sleep apnea alcohol abuse with cirrhosis although the patient denies current alcohol intake. The patient does not think he is a diabetic however looking through [place] link I see 2 HgbAic's of this year that were greater than equal to 6.5 which by definition makes him diabetic. Nevertheless he is not on any treatment and does not check his blood sugars. The patient is now back home out  of the nursing home. Saw Dr. Drucilla Schmidt last week he was taken off vancomycin and ertapenem on August 22 and now is on doxycycline on Augmentin. He also saw Dr. Buford Dresser who is his orthopedic surgeon in [place] he recommended a [employer]. He has been using Medihoney. 9/6I have been having trouble getting hyperbarics approved through our prior authorization process. Even though he had a limb threatening infection in the left foot and probably the left ankle there glitches in how some of the reports are worded also some of the consultants. In any case I am going to repeat his sedimentation rate and C-reactive protein. I am generally not in favor of doing things like this as they really do not alter the plan of care from my point of view however I am going to need to demonstrate that these remain high in order to get this through forhyperbaric treatment for chronic refractory osteomyelitis 9/13; following this man for a wound on his left plantar foot in the setting of type 2 diabetes and Charcot deformity. He has underlying chronic refractory osteomyelitis. Follow-up sedimentation rate and C-reactive protein were both elevated but the C-reactive protein was down to 1.4, sedimentation rate at 70. Sedimentation rate was only slightly down from previous at 85. His wound is measuring slightly smaller. 9/20; patient started hyperbaric oxygen therapy today and tolerated treatment well. This is for the underlying osteomyelitis. He remains on antibiotics but thinks he is getting close to finishing. The wound on the plantar aspect of his foot is the other issue we are following here. He is using Medihoney The patient has a Charcot foot in the setting of type 2 diabetes. He is going to need a total contact cast although his partner was away this week and we elected to delay this till next week 9/27; patient still tolerating hyperbaric oxygen well. Wound looked generally healthy not much depth under illumination still  100% covered in fibrinous debris. Raised callused edges around the wound he was prepared for a total contact cast. We have been using Medihoney 9/30; patient is back for his first obligatory total contact cast change. We are using Hydrofera Blue. Noted by our intake nurse to have a lot of drainage or at least a moderate amount of drainage. I  am not sure I was previously aware of this 10/4; patient arrives today with a lot of drainage under the cast. When he had it changed last Friday there was also a similar amount of drainage. Her intake nurse says that they tried a wound VAC on him perhaps while I was on vacation in August under the cast but that did not work. In my experience that has not been unusual. We have been using Hydrofera Blue with all the secondary absorbers. The drainage today went right through all of our dressings. The patient is concerned about his foot being in a cast without much drainage. He is tolerating HBO well. There has been improvements in the wound in the mid part of his foot in the setting of a Charcot deformity 10/7; patient presents for cast change. He has no issues or complaints today. He denies signs of infection. 10/11; patient presents for cast change. At this time he would like to take a break from the cast. He would like to do daily dressing changes with Hydrofera Blue. He currently denies signs of infection. 10/14; his cast was taken off last week at his request. He arrives in the clinic with [facility]. He has been changing the dressing himself. He has way too much edema in the left foot and leg to consider a total contact cast. I do not really know the issue here. He does have chronic venous insufficiency His wife stopped me in the clinic earlier in the week to report he is drinking again and she is concerned. I am uncertain whether there are other issues 10/18; he arrived in clinic last week having bilateral lower extremity edema likely secondary to chronic  venous insufficiency there was too much edema in the left leg to apply a total contact cast. I put him in compression on the left leg to control the swelling. This week he cut the wrap on the foot for reasons that are unclear however today he arrives in clinic with a smaller left leg but massive edema in the left foot. He is supposed to be wearing a juxta lite stocking on the right leg but he is not wearing the contact layer. His attendance at hyperbaric oxygen has been dwindling, he did not dive yesterday and he did not dive today TELANDER, [name] ([name]) [id number] Page 4 of 14 concerned about hyperbarics causing swelling. I looked back in his record his last echo was in 2020 essentially normal left and right ventricular function. Last BUN and creatinine were done in July this was normal. He is having a lot of drainage in the left plantar foot wound 10/25; again he comes in today having missed HBO yesterday. Macerated skin around the wound which really does not look very good at all. A lot of edema in the left foot but an improvement in edema on the left leg. We wrapped him last week because of the amount of swelling in the left leg. We could not apply a total contact cast. The patient states that he wants to be able to change his dressing himself. I might consider this if he had stockings to control the swelling. He said he be here for HBO tomorrow. I will check the degree of erythema in his forefoot which I have marked. He is on doxycycline and Augmentin which was renewed by Dr. Drucilla Schmidt but I cannot see a follow-up note, follow-up inflammatory markers etc. I 1 point he said he was going to see his orthopedic surgeon in [place]  I am not sure if he ever did this. I do not know why he has not followed up with infectious disease, he says he was not given an appointment but he is still on Augmentin doxycycline 11/1; he did not do the lab work I ordered last week. Still on  Augmentin and doxycycline. Silver alginate and he is changing this daily using his own juxta lite stocking. The surface of the wound does not look too bad. No real epithelialization however. The patient was seen today along with HBO 08/26/2021 upon evaluation today patient is being seen at his request by myself he wanted to transfer care to see me. With that being said he has been seeing Dr. Dellia Nims since he came back in August of this year. Unfortunately he tells me he still having quite a bit of drainage. He is also significant erythema and warmth noted of the foot as well. He has been hit or miss with regard to hyperbaric oxygen therapy tells me that at this point he took this week off because he was extremely claustrophobic and having a lot of issues he plans to start back next week. Nonetheless he has been given some medication by Dr. Dellia Nims as well to help calm things down which again may help him. Hopefully he can get back in hyperbarics as I think this is likely necessary. Nonetheless there does seem to be evidence of cellulitis noted today as well and in general I am a little concerned in that regard. His wound unfortunately is significant on this left foot and is right where he takes the brunt of the force secondary to the Charcot arthropathy. I do not think a total contact cast is ideal due to the fact that he unfortunately is draining much too significantly he is also not happy with the idea of using a cast therefore he states he would not want to do that anyway. 09/16/2021 upon evaluation today patient's plantar foot ulcer unfortunately continues to show signs of issues here. He has been in the hospital due to trying to detox himself from alcohol he had withdrawal symptoms and subsequently was hospitalized. During that time it was recommended by both orthopedics as well as infectious disease apparently that he proceed with amputation. With that being said they discussed with him the risk of  not doing so. This is well- documented in the encounter. With that being said the patient does not want to proceed down that road and tells me that he declined their advice in that regard. Subsequently our goal then is to try to do what we can to try to get this thing healed and closed. I think this means he is getting need to stay off of it is much as possible he does have a wheelchair at home that he tells me he can use I think that that is going to be something that he does need to do. 09/23/2021 upon evaluation today patient continues to have significant issues here with his foot ulcer which is plantar on the left foot. Subsequently again he does have a history of Charcot foot he also has an issue of having had osteomyelitis as well as an open wound for some time here. Its been recommended multiple times for him to have an amputation although that is not something that he is really interested or wanting to do. 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 Subsequently this means that he also has a gait abnormality  that is causing issues with his walking and causing abnormal pressures in the central part of his foot this is the reason he has a wound. Nonetheless I want to see what I can do about trying to get a boot to help with stabilization and offloading of working to see what we can do in that regard. 09/30/2021 upon evaluation today patient appears to be doing well with regard to his plantar foot ulcer. He still has significant granular hypergranulation but again this is much less than what it was even last time I saw him last week. Fortunately I think we are headed in the right direction. This is definitely something we could consider a skin graft or something along those lines if we can get it flattened out enough and get the drainage from being so significant. I feel like we are making some headway here. 10/07/2021 upon evaluation today patient  appears to be doing decently well in regard to his wound. Fortunately there does not appear to be any signs of infection overall I feel like it is actually showing some signs of improvement. He still has some hypergranulation but this is much less than its been. 10/21/2021 upon evaluation today patient appears to be doing well with regard to his wound is actually showing some signs of improvement which is great. We have been doing the chemical cauterization with silver nitrate as well as debridement as needed and again has been using silver alginate up to this point. With that being said he does have some Hydrofera Blue at home he wonders if that can be beneficial at this point. 11/25/2021 upon evaluation today patient appears to be doing somewhat poorly in regard to his plantar foot ulcer. He is also been having some issues with his congestive heart failure. He has recently been in the hospital. I did review those notes as well and apparently his heart failure has been somewhat out of control. He is actually being managed as an outpatient in this regard but nonetheless this is something that can still be kept a close eye on according to the notes and what I see. Fortunately I do not see any signs of active infection at this time which is great news. Nonetheless I am concerned about the boggy central portion of the wound which I think is going to likely open up in the near future. Readmission: 01-27-2022 patient presents for follow-up here in the clinic today. His last been November 25, 2021 since have seen him. At that time we just ordered an MRI fortunately the MRI did not show any obvious signs of osteomyelitis and there were some reactive changes which were questionable and could not be excluded. With that being said in the interim since have seen him back on February 8 things have gotten worse from the standpoint of how the wound appears today. I do think that he is going to require potentially more  debridement to clean this wound out than what I can even offer here in the clinic there is a tremendous amount of hypergranulation tissue which is not sufficient to grow new tissue over and honestly I think this is going to lead to a delay in healing if its not taking care of. I need to see if I can get him into see a surgeon to get an opinion on whether or not they could take him to the OR for an aggressive surgical debridement to clear out this hypergranulation tissue and achieve hemostasis which is can be the  biggest issue with me here in the clinic as he does tend to bleed even with very light superficial debridements. The patient's not opposed to this and he states that if I have someone that like for him to see he would be happy to go. Otherwise his medical history really has not changed. He does tell me he is getting ready to go for an evaluation for CPAP machine. Readmission 05/04/2022 Patient was last seen 3 months ago. He states he went to rehab for alcohol abuse. He continues to have a wound on the plantar aspect of the left foot. He states this has never completely healed over the past several years. He is following with podiatry for this issue currently and they are recommending blast X with collagen and a cam boot. He has not started this yet. He has home health. Currently he is using silver alginate. He also follows with Dr. Drucilla Schmidt infectious disease who ordered an MRI of the foot as he is concerned for osteomyelitis. CRP and sed rate are elevated. He is currently on Augmentin for prophylaxis as he has a history of osteomyelitis to this foot. Infectious disease has recommended amputation. Currently patient denies systemic signs of infection. 8/7; patient is following up with podiatry on 8/11. He wanted to come in to be debrided. He has no issues or complaints today. He has been using silver alginate to the wound bed. He has home health that helps change the dressings. He had an MRI  completed on 7/18 that did not show evidence of osteomyelitis. He is scheduled to see infectious disease on 8/16. 8/25; Patient is following with podiatry for his wound care and he is currently using silver alginate and a defender boot for his Left foot wound. He would like to have debridement to the wound but is unable to see podiatry until the end of the month. He has asked if we could debride him. 9/21; patient follows with podiatry for his foot wound but likes to come into our clinic for debridements. He was last seen on 06/24/2022 by Dr. Posey Pronto, podiatry. He is currently using blast X to the wound bed. He has been using a Conservation officer, nature. He is not scheduled to see podiatry until 10/11 and comes in for debridement today. 10/9; patient presents for follow-up. He states he is no longer following with podiatry. He has been doing silver alginate to the wound bed. He states he would like to attempt a total contact cast but starting next week. He currently denies systemic signs of infection. 10/16; patient presents for follow-up. He has been using silver alginate to the wound bed. We discussed having the cast placed today and patient would like to proceed with this. He has follow-up in 2 days for his cast exchange. He currently denies signs of infection. [name], [name] ([name]) [id number] Page 5 of 14 10/18; total contact cast placed 2 days ago. He comes back in for the obligatory total contact cast change HOWEVER our intake nurse noted copious drainage soaking right through the dressings and asked me to look at this. Indeed there was a large amount of drainage soaking right through all of the dressings that were applied. I cannot imagine that this would last 7 days. Even if we change the cast twice a week I would be concerned. We worked on this patient through the late part of 2022 in the early 2023 for this same wound. We attempted a cast at that time I think even a cast  with  a wound VAC and for 1 reason or another we could never make this progress towards closure. Fortunately his recent MRI did not show osteomyelitis. 10/26; patient presents for follow-up. He has been using silver alginate to the wound bed. He has no issues or complaints today. 11/3; wound edges are macerated secondary to copious drainage. PCR culture came back showing MRSA this has been sent to [facility] but we do not yet have that available.. Nursing reports still copious drainage 11/17; patient presents for follow-up. He states he received Keystone antibiotic ointment last week and has been using this With silver alginate. He reports improvement in drainage. He has no issues or complaints today. 12/1; patient presents for follow-up. He has been using Keystone antibiotic ointment with silver alginate. He reports continued improvement in drainage. We discussed the total contact cast today. For now he would like to hold off until later in the month. 12/14; patient presents for follow-up. He has been using Keystone antibiotic ointment with blast X and silver alginate. He states he would like to defer the total contact cast until the first of the year. He has been using his Conservation officer, nature. He currently denies signs of infection. He reports low amount of drainage. 1/4; patient presents for follow-up. He has been using Keystone antibiotic ointment and silver alginate. He has been on his feet more lately and reports that the drainage has increased again. His wound has declined in appearance. He currently denies signs of infection. He does not want to attempt the total contact cast. Electronic Signature(s) Signed: 10/21/2022 2:54:58 PM By: Kalman Shan DO Entered By: Kalman Shan on 10/21/2022 12:34:34 -------------------------------------------------------------------------------- Physical Exam Details Patient Name: Date of Service: [name], [name] 10/21/2022 10:15 A M Medical Record Number:  [name] Patient Account Number: [id number] Date of Birth/Sex: Treating RN: 06-Oct-1961 (62 y.o. M) Primary Care Provider: Billey Gosling Other Clinician: Referring Provider: Treating Provider/Extender: Mickle Asper in Treatment: 24 Constitutional respirations regular, non-labored and within target range for patient.. Cardiovascular 2+ dorsalis pedis/posterior tibialis pulses. Psychiatric pleasant and cooperative. Notes T the left plantar midfoot there is an open wound with granulation Although tissue does not appear healthy. Undermining from 11-8 o'clock. No signs of o surrounding infection. Electronic Signature(s) Signed: 10/21/2022 2:54:58 PM By: Kalman Shan DO Entered By: Kalman Shan on 10/21/2022 12:35:28 -------------------------------------------------------------------------------- Physician Orders Details Patient Name: Date of Service: [name], [name] 10/21/2022 10:15 A M Medical Record Number: [name] Patient Account Number: [id number] Date of Birth/Sex: Treating RN: [birth year] (62 y.o. [name] Primary Care Provider: Billey Gosling Other Clinician: ALEXANDRU, [name] ([name]) [id number] Page 6 of 14 Referring Provider: Treating Provider/Extender: Mickle Asper in Treatment: 53 Verbal / Phone Orders: No Diagnosis Coding Follow-up Appointments ppointment in 2 weeks. - w/ Dr. Heber Guyton Dellia Nims covering) on Monday 01/15 @ 1100 Rm # 9 Return A Other: - continue topical antibiotics Keystone Antibiotics. hold on the total contact cast till after traveling for the holidays Anesthetic (In clinic) Topical Lidocaine 4% applied to wound bed Cellular or Tissue Based Products Cellular or Tissue Based Product Type: - Run IVR for Grafix=20% Copay Edema Control - Lymphedema / SCD / Other Avoid standing for long periods of time. Exercise regularly Moisturize legs daily. Compression stocking or  Garment 30-40 mm/Hg pressure to: - wear the Juxtalite HD to right and left leg. apply in the morning and remove at night. Off-Loading Other: - HOld the Defender Boot: minimal weight bearing left foot use wheelchair for mobility. Peg  assist with surgical shoe for offloading for now. Additional Orders / Instructions Follow Nutritious Diet Wound Treatment Wound #5 - Foot Wound Laterality: Plantar, Left Cleanser: Wound Cleanser (DME) (Generic) 1 x Per Day/30 Days Discharge Instructions: Cleanse the wound with wound cleanser prior to applying a clean dressing using gauze sponges, not tissue or cotton balls. Topical: topical antibiotics 1 x Per Day/30 Days Discharge Instructions: Mix with Blastx and apply directly to wound bed. Prim Dressing: KerraCel Ag Gelling Fiber Dressing, 4x5 in (silver alginate) (DME) (Generic) 1 x Per Day/30 Days ary Discharge Instructions: Apply silver alginate over the Blastx and topical antibiotics. Secondary Dressing: ABD Pad, 5x9 (DME) (Generic) 1 x Per Day/30 Days Discharge Instructions: Apply over primary dressing as directed. Secondary Dressing: Drawtex 4x4 in 1 x Per Day/30 Days Discharge Instructions: cut to into wound bed over the alginate. Secured With: [employer], 4.5x3.1 (in/yd) (DME) (Generic) 1 x Per Day/30 Days Discharge Instructions: Secure with Kerlix as directed. Secured With: 64M Medipore H Soft Cloth Surgical T ape, 4 x 10 (in/yd) (DME) (Generic) 1 x Per Day/30 Days Discharge Instructions: Secure with tape as directed. Electronic Signature(s) Signed: 10/21/2022 2:54:58 PM By: Kalman Shan DO Signed: 10/22/2022 12:14:03 PM By: Rhae Hammock RN Entered By: Rhae Hammock on 10/21/2022 13:59:23 -------------------------------------------------------------------------------- Problem List Details Patient Name: Date of Service: AVIN, BONTON [name] 10/21/2022 10:15 A M Medical Record Number: [id number] Patient Account Number: [id number] Date  of Birth/Sex: Treating RN: 1961/09/03 (62 y.o. M) Primary Care Provider: Billey Gosling Other Clinician: Referring Provider: Treating Provider/Extender: Mickle Asper in Treatment: [address], [place] ([id number]) [id number] Page 7 of 14 Active Problems ICD-10 Encounter Code Description Active Date MDM Diagnosis E11.621 Type 2 diabetes mellitus with foot ulcer 05/04/2022 No Yes L97.522 Non-pressure chronic ulcer of other part of left foot with fat layer exposed 05/04/2022 No Yes M86.372 Chronic multifocal osteomyelitis, left ankle and foot 05/04/2022 No Yes M14.672 Charcot's joint, left ankle and foot 05/04/2022 No Yes Inactive Problems Resolved Problems Electronic Signature(s) Signed: 10/21/2022 2:54:58 PM By: Kalman Shan DO Entered By: Kalman Shan on 10/21/2022 12:31:11 -------------------------------------------------------------------------------- Progress Note Details Patient Name: Date of Service: [name] 10/21/2022 10:15 A M Medical Record Number: [id number] Patient Account Number: [id number] Date of Birth/Sex: Treating RN: Sep 01, 1961 (62 y.o. M) Primary Care Provider: Billey Gosling Other Clinician: Referring Provider: Treating Provider/Extender: Mickle Asper in Treatment: 24 Subjective Chief Complaint Information obtained from Patient Left foot ulcer History of Present Illness (HPI) 10/31/2019 upon evaluation today patient appears to be doing somewhat poorly in regard to his bilateral plantar feet. He has wounds that he tells me have been present since 2012 intermittently off and on. Most recently this has been open for at least the past 6 months to a year. He has been trying to treat this in different ways using Santyl along with various other dressings including Medihoney and even at one point Xeroform. Nothing really has seem to get this completely closed. He was recently in the hospital for  cellulitis of his leg subsequently he did have x-rays as well as MRIs that showed negative for any signs of osteomyelitis in regard to the wounds on his feet. Fortunately there is no signs of systemic infection at this time. No fevers, chills, nausea, vomiting, or diarrhea. Patient has previously used Darco offloading shoes as far as frontal floaters as well as postop surgical shoes. He has never been in a total contact cast that may be something we need  to strongly consider here. Patient's most recent hemoglobin A1c 1 month ago was 5.3 seems to be very well controlled which is great. Subsequently he has seen vascular as well as podiatry. His ABIs are 1.07 on the left and 1.14 on the right he seems to be doing well he does have chronic venous stasis. 11/07/2019 upon evaluation today patient appears to be doing well with regard to his wounds all things considered. I do not see any severe worsening he still has some callus buildup on the right more than the left he notes that he has been probably more active than he should as far as walking is concerned is just very hard to not be active. He knows he needs to be more careful in this regard however. He is willing to give the cast a try at this point although he notes that he is a little nervous about this just with regard to balance although he will be very careful and obviously if he has any trouble he knows to contact the office and let me know. 1/22; patient is in for his obligatory first total contact cast change. Our intake nurse reported a very large amount of drainage which is spelled out over to the surrounding skin. Has bilateral diabetic foot wounds. He has Charcot feet. We have been using silver alginate on his wounds. 11/14/2019 on evaluation today patient is actually seeming to make good improvement in regard to his bilateral plantar foot wounds. We have been using a cast on the left side and on the right side he has been using dressings he is  changing up his own accord. With that being said he tells me that he is also not walking as much just due to how unsteady he feels. He takes it easy when he does have to walk and when he does not have to walk he is resting. This is probably help in his right foot as well has the left foot which is actually measuring better. In fact both are measuring better. Overall I am very pleased with how things seem to be progressing. The patient does have some odor on the left foot this does have me concerned about the possibility of infection, and actually [name], [name] ([id number]) [id number] Page 8 of 14 probably go ahead and put him on antibiotics today as well as utilizing a continuation of the cast on the left foot I think that will be fine we probably just need to bring him in sooner to change this not last a whole week. 1/29; we brought the patient back today for a total contact cast change on the left out of concern for excessive drainage. We are using drawtex over the wound as the primary dressing 11/21/2019 on evaluation today patient appears to be doing well with regard to his left plantar foot. In fact both foot ulcers actually seem to be doing pretty well. Nonetheless he is having a lot of drainage on the left at this time and again we did obtain a wound culture did show positive for Staph aureus that was reviewed by myself today as well. Nonetheless he is on Bactrim which was shown to be sensitive that should be helping in this regard. Fortunately there is no signs of infection systemically at this point. 2/5; back in clinic today for a total contact cast change apparently secondary to very significant drainage. Still using drawtex 11/28/2019 upon evaluation today patient appears to be doing well with regard to his wounds. The right foot  is doing okay as measured about the same in my opinion. The left foot is actually showing signs of significant improvement is measuring  smaller there is a lot of hyper granulation likely due to the continued drainage at this point. We did obtain approval for a snap VAC I think that is good to be appropriate for him and will likely help this tremendously underneath the cast. He is definitely in agreement with proceeding with such. 2/12; patient came in today after his snap VAC lost suction. Brought in to see one of our nurses. The dressing was replaced and then we put the cast back on and rehooked up the snap VAC. Apparently his wound looked very good per our intake nurse. 2/15; again we replaced the cast on Friday. By Saturday the snap VAC and light suction. He called this morning he comes in acutely. The wounds look fine however the VAC is not functioning. We replaced the cast using silver alginate as the primary dressing backed with Kerramax. The snap VAC was not replaced 12/05/2019 upon evaluation today patient appears to be doing better in regard to his left plantar foot ulcer. Fortunately there is no signs of active infection at this time. Unfortunately he is continuing to have issues with the right foot he is really not making any progress here things seem to be somewhat stagnant to be honest. The depth has increased but that is due to me having debrided the wound in the past based on what I am seeing. 12/12/2019 on evaluation today patient appears to be doing more poorly in regard to the left lower extremity. He has some erythema spreading up the side of his foot I am concerned about infection again at this point. Unfortunately he has been seeing improvement with a total contact cast but I do not think we should put that on today. On his right plantar foot he continues to have significant drainage this is actually measuring deeper I really do not feel like you are making any progress whatsoever. I have prescribed Granix for him unfortunately his insurance apparently was going to cost him a $500 co-pay. 12/19/2019 upon evaluation  today patient actually appears to be doing better in regard to both wounds. With that being said he actually did get the reGranix which he had to pay $500 for. With that being said it does look like that he is actually made some improvement based on what I am seeing at this point with the reGranix. Obviously if he is going to continue this we are going to do something about trying to get him some help in covering the cost. 12/26/2019 on evaluation today patient appears to be doing really much better even compared to last week. Overall the wound seems to be much better even compared to last week and last week was better than the week before. Since has been using the reGranix his symptoms have improved significantly. With that being said the issue right now is simply that this is a very expensive medication for him the first dose cost him $500. Upon inspection patient's wound bed actually is however dramatically improved compared to before he started this 2 weeks ago. 01/02/2020 upon evaluation today patient appears to actually be doing well. He still had a little bit of reGranix left that has been using in small amounts he just been applying it every other day instead of every day in order to make it go longer. Overall we are still seeing excellent improvement he is measuring  smaller looking better healthier tissue and everything seems to be pointing to this headed in the right direction. Fortunately there is no evidence of infection either which is also excellent news. He does have his MRI coming up within the next week. 01/09/2020 upon evaluation today patient appears to be doing a little worse today compared to previous week's evaluation. He is actually been out of the reGranix at this point. He has been trying to make the stretch out so he has been changing the dressings on a regular set schedule like he was previous. I think this has made a difference. Fortunately there is no signs of active infection  at this time. No fevers, chills, nausea, vomiting, or diarrhea. 01/16/2020 upon evaluation today patient appears to be doing well with regard to his left plantar foot ulcer. The right plantar foot still shows some significant depth at this point. Fortunately there is no signs of active infection at this time. 01/30/2020 upon evaluation today patient appears to be doing about the same in regard to his right plantar foot ulcer there is still some depth here and we had to wait till he actually switched over to his new insurance to get approval for the MRI under his new insurance plan. With that being said he now has switched as of April 1. Fortunately there is no signs of active infection at this time. Overall in regard to his left foot ulcer this seems to be doing much better and I am actually very pleased with how things are going. With that being said it is not quite as much progress as we were seeing with the reGranix but at the same time he has had trouble getting this apparently there is been some hindrance here. I Georgina Peer try to actually send this to [facility] that was recommended by the drug rep to me. 02/13/20 upon evaluation today patient appears to be doing better in regard to his left foot ulcer this is great news. Unfortunately the right foot ulcer is not really significantly better at this time. There is no signs of active infection systemically though he did have his MRI which showed unfortunately he does have infection noted including an abscess in the foot. There is also marrow changes noted which are consistent with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

## 2022-10-22 NOTE — Progress Notes (Addendum)
[name], [name] ([id number]) [id number] Page 1 of 8 Visit Report for 10/21/2022 Arrival Information Details Patient Name: Date of Service: [name] 10/21/2022 10:15 A M Medical Record Number: [id number] Patient Account Number: [id number] Date of Birth/Sex: Treating RN: 01/14/1961 (62 y.o. [name], [name] Primary Care Delrae Hagey: Billey Gosling Other Clinician: Referring [name]: Treating [name]/Extender: Mickle Asper in Treatment: 24 Visit Information History Since Last Visit Added or deleted any medications: No Patient Arrived: Ambulatory Any new allergies or adverse reactions: No Arrival Time: 10:47 Had a fall or experienced change in No Accompanied By: self activities of daily living that may affect Transfer Assistance: None risk of falls: Patient Identification Verified: Yes Signs or symptoms of abuse/neglect since last visito No Secondary Verification Process Completed: Yes Hospitalized since last visit: No Patient Requires Transmission-Based Precautions: No Implantable device outside of the clinic excluding No Patient Has Alerts: No cellular tissue based products placed in the center since last visit: Has Dressing in Place as Prescribed: Yes Has Compression in Place as Prescribed: Yes Pain Present Now: No Electronic Signature(s) Signed: 10/22/2022 12:14:03 PM By: Rhae Hammock RN Entered By: Rhae Hammock on 10/21/2022 10:47:52 -------------------------------------------------------------------------------- Complex / Palliative Patient Assessment Details Patient Name: Date of Service: [name] IG 10/21/2022 10:15 A M Medical Record Number: [id number] Patient Account Number: [id number] Date of Birth/Sex: Treating RN: 1961/06/02 (62 y.o. [name] Primary Care Sharonlee Nine: Billey Gosling Other Clinician: Referring [name]: Treating [name]/Extender: Mickle Asper in Treatment:  24 Complex Wound Management Criteria Patient has remarkable or complex co-morbidities requiring medications or treatments that extend wound healing times. Examples: Diabetes mellitus with chronic renal failure or end stage renal disease requiring dialysis Advanced or poorly controlled rheumatoid arthritis Diabetes mellitus and end stage chronic obstructive pulmonary disease Active cancer with current chemo- or radiation therapy DM type II, osteomyelitis, HTN, PVD, cirrhosis- hx ETOH, Sleep Apnea, CHF Palliative Wound Management Criteria Care Approach Wound Care Plan: Complex Wound Management Electronic Signature(s) Signed: 10/29/2022 4:42:15 PM By: Deon Pilling RN, BSN Signed: 11/04/2022 4:12:48 PM By: Kalman Shan DO Entered By: Deon Pilling on 10/29/2022 16:42:15 [place], [place] ([id number]) [id number] Page 2 of 8 -------------------------------------------------------------------------------- Encounter Discharge Information Details Patient Name: Date of Service: [name], [name] 10/21/2022 10:15 A M Medical Record Number: [id number] Patient Account Number: [id number] Date of Birth/Sex: Treating RN: 1961/09/17 (62 y.o. [name], [name] Primary Care [name]: Billey Gosling Other Clinician: Referring [name]: Treating [name]/Extender: Mickle Asper in Treatment: 24 Encounter Discharge Information Items Post Procedure Vitals Discharge Condition: Stable Temperature (F): 98.7 Ambulatory Status: Cane Pulse (bpm): 74 Discharge Destination: Home Respiratory Rate (breaths/min): 17 Transportation: Private Auto Blood Pressure (mmHg): 120/80 Accompanied By: self Schedule Follow-up Appointment: Yes Clinical Summary of Care: Patient Declined Electronic Signature(s) Signed: 10/22/2022 12:14:03 PM By: Rhae Hammock RN Entered By: Rhae Hammock on 10/21/2022  11:18:56 -------------------------------------------------------------------------------- Lower Extremity Assessment Details Patient Name: Date of Service: [name], [name] IG 10/21/2022 10:15 A M Medical Record Number: [id number] Patient Account Number: [id number] Date of Birth/Sex: Treating RN: Jan 23, 1961 (62 y.o. [name] Primary Care [name]: Billey Gosling Other Clinician: Referring [name]: Treating [name]/Extender: [name] Weeks in Treatment: 24 Edema Assessment Assessed: [Left: Yes] [Right: No] Edema: [Left: N] [Right: o] Calf Left: Right: Point of Measurement: 37 cm From Medial Instep 39 cm Ankle Left: Right: Point of Measurement: 11 cm From Medial Instep 23.5 cm Vascular Assessment Pulses: Dorsalis Pedis Palpable: [Left:Yes] Posterior Tibial Palpable: [Left:Yes] Electronic Signature(s) Signed: 10/22/2022 12:14:03  PM By: Rhae Hammock RN Entered By: Rhae Hammock on 10/21/2022 10:50:03 Floris, [name] ([id number]) [id number] Page 3 of 8 -------------------------------------------------------------------------------- Multi Wound Chart Details Patient Name: Date of Service: [name], [name] 10/21/2022 10:15 A M Medical Record Number: [id number] Patient Account Number: [id number] Date of Birth/Sex: Treating RN: [birth year] (62 y.o. M) Primary Care [name]: Billey Gosling Other Clinician: Referring [name]: Treating [name]/Extender: Mickle Asper in Treatment: 24 Vital Signs Height(in): 73 Pulse(bpm): 106 Weight(lbs): 237 Blood Pressure(mmHg): 150/88 Body Mass Index(BMI): 31.3 Temperature(F): 98.1 Respiratory Rate(breaths/min): 17 [5:Photos:] [N/A:N/A] Left, Plantar Foot N/A N/A Wound Location: Gradually Appeared N/A N/A Wounding Event: Diabetic Wound/Ulcer of the Lower N/A N/A Primary Etiology: Extremity Anemia, Sleep Apnea, Congestive N/A N/A Comorbid History: Heart  Failure, Hypertension, Peripheral Venous Disease, Cirrhosis , Type II Diabetes, Osteoarthritis, Osteomyelitis, Neuropathy 10/19/2019 N/A N/A Date Acquired: 24 N/A N/A Weeks of Treatment: Open N/A N/A Wound Status: No N/A N/A Wound Recurrence: 4x3.5x2 N/A N/A Measurements L x W x D (cm) 10.996 N/A N/A A (cm) : rea 21.991 N/A N/A Volume (cm) : -164.60% N/A N/A % Reduction in A rea: -5199.00% N/A N/A % Reduction in Volume: 12 Starting Position 1 (o'clock): 12 Ending Position 1 (o'clock): 1.5 Maximum Distance 1 (cm): Yes N/A N/A Undermining: Grade 2 N/A N/A Classification: Medium N/A N/A Exudate A mount: Serosanguineous N/A N/A Exudate Type: red, brown N/A N/A Exudate Color: Thickened N/A N/A Wound Margin: Large (67-100%) N/A N/A Granulation A mount: Red, Pink, Hyper-granulation N/A N/A Granulation Quality: None Present (0%) N/A N/A Necrotic A mount: Fat Layer (Subcutaneous Tissue): Yes N/A N/A Exposed Structures: Fascia: No Tendon: No Muscle: No Joint: No Bone: No None N/A N/A Epithelialization: Debridement - Excisional N/A N/A Debridement: Pre-procedure Verification/Time Out 11:07 N/A N/A Taken: Lidocaine N/A N/A Pain Control: Callus, Subcutaneous, Slough N/A N/A Tissue Debrided: Skin/Subcutaneous Tissue N/A N/A Level: 14 N/A N/A Debridement A (sq cm): rea Curette N/A N/A Instrument: Minimum N/A N/A Bleeding: MANN, SKAGGS ([id number]) [id number] Page 4 of 8 Pressure N/A N/A Hemostasis Achieved: 0 N/A N/A Procedural Pain: 0 N/A N/A Post Procedural Pain: Procedure was tolerated well N/A N/A Debridement Treatment Response: 4x3.5x2 N/A N/A Post Debridement Measurements L x W x D (cm) 21.991 N/A N/A Post Debridement Volume: (cm) Callus: Yes N/A N/A Periwound Skin Texture: Excoriation: No Induration: No Crepitus: No Rash: No Scarring: No Maceration: No N/A N/A Periwound Skin Moisture: Dry/Scaly:  No Atrophie Blanche: No N/A N/A Periwound Skin Color: Cyanosis: No Ecchymosis: No Erythema: No Hemosiderin Staining: No Mottled: No Pallor: No Rubor: No No Abnormality N/A N/A Temperature: Debridement N/A N/A Procedures Performed: Treatment Notes Wound #5 (Foot) Wound Laterality: Plantar, Left Cleanser Wound Cleanser Discharge Instruction: Cleanse the wound with wound cleanser prior to applying a clean dressing using gauze sponges, not tissue or cotton balls. Peri-Wound Care Topical topical antibiotics Discharge Instruction: Mix with Blastx and apply directly to wound bed. Primary Dressing KerraCel Ag Gelling Fiber Dressing, 4x5 in (silver alginate) Discharge Instruction: Apply silver alginate over the Blastx and topical antibiotics. Secondary Dressing ABD Pad, 5x9 Discharge Instruction: Apply over primary dressing as directed. Drawtex 4x4 in Discharge Instruction: cut to into wound bed over the alginate. Secured With [employer], 4.5x3.1 (in/yd) Discharge Instruction: Secure with Kerlix as directed. 69M Medipore H Soft Cloth Surgical T ape, 4 x 10 (in/yd) Discharge Instruction: Secure with tape as directed. Compression Wrap Compression Stockings Add-Ons Electronic Signature(s) Signed: 10/21/2022 2:54:58 PM By: Kalman Shan DO Entered By: Kalman Shan on  10/21/2022 12:31:16 -------------------------------------------------------------------------------- Multi-Disciplinary Care Plan Details Patient Name: Date of Service: [name], [name] 10/21/2022 10:15 A [name], [name] ([id number]) [id number] Page 5 of 8 Medical Record Number: [id number] Patient Account Number: [id number] Date of Birth/Sex: Treating RN: 1961-01-09 (62 y.o. [name] Primary Care Lavana Huckeba: [name] Other Clinician: Referring Colter Magowan: Treating Graysyn Bache/Extender: [name] in Treatment: 24 Active Inactive Necrotic  Tissue Nursing Diagnoses: Impaired tissue integrity related to necrotic/devitalized tissue Goals: Necrotic/devitalized tissue will be minimized in the wound bed Date Initiated: 06/11/2022 Target Resolution Date: 11/19/2022 Goal Status: Active Patient/caregiver will verbalize understanding of reason and process for debridement of necrotic tissue Date Initiated: 06/11/2022 Date Inactivated: 08/20/2022 Target Resolution Date: 08/20/2022 Goal Status: Met Interventions: Assess patient pain level pre-, during and post procedure and prior to discharge Treatment Activities: Apply topical anesthetic as ordered : 06/11/2022 Excisional debridement : 06/11/2022 Notes: pale pink tissue- debridement performed by Jodean Valade. MRSA culture Dierks Wach order topical antibiotics. Electronic Signature(s) Signed: 10/22/2022 12:14:03 PM By: Fonnie Mu RN Entered By: Fonnie Mu on 10/21/2022 11:03:03 -------------------------------------------------------------------------------- Pain Assessment Details Patient Name: Date of Service: [name], [name] IG 10/21/2022 10:15 A M Medical Record Number: [id number] Patient Account Number: [id number] Date of Birth/Sex: Treating RN: [birth year] (62 y.o. [name] Primary Care Pleasant Bensinger: [name] Other Clinician: Referring Joannah Gitlin: Treating Caz Weaver/Extender: Morley Kos Weeks in Treatment: 24 Active Problems Location of Pain Severity and Description of Pain Patient Has Paino No Site Locations [place], [place] ([id number]) [id number] Page 6 of 8 Pain Management and Medication Current Pain Management: Electronic Signature(s) Signed: 10/22/2022 12:14:03 PM By: Fonnie Mu RN Entered By: Fonnie Mu on 10/21/2022 10:48:22 -------------------------------------------------------------------------------- Patient/Caregiver Education Details Patient Name: Date of Service: Benjaman Kindler  1/4/2024andnbsp10:15 A M Medical Record Number: [id number] Patient Account Number: [id number] Date of Birth/Gender: Treating RN: 04-29-1961 (62 y.o. [name] Primary Care Physician: [name] Other Clinician: Referring Physician: Treating Physician/Extender: [name] in Treatment: 24 Education Assessment Education Provided To: Patient Education Topics Provided Wound/Skin Impairment: Methods: Explain/Verbal Responses: Reinforcements needed, State content correctly [employer]) Signed: 10/22/2022 12:14:03 PM By: Fonnie Mu RN Entered By: Fonnie Mu on 10/21/2022 11:03:19 -------------------------------------------------------------------------------- Wound Assessment Details Patient Name: Date of Service: [name], [name] IG 10/21/2022 10:15 A M Medical Record Number: [id number] Patient Account Number: [id number] Date of Birth/Sex: Treating RN: [birth year] (62 y.o. [name] Primary Care Timeka Goette: [name] Other Clinician: Referring Victorious Kundinger: Treating Wilmarie Sparlin/Extender: Morley Kos Weeks in Treatment: 24 Wound Status Wound Number: 5 Primary Diabetic Wound/Ulcer of the Lower Extremity Etiology: Wound Location: Left, Plantar Foot Wound Open Wounding Event: Gradually Appeared Status: Date Acquired: 10/19/2019 Comorbid Anemia, Sleep Apnea, Congestive Heart Failure, Hypertension, Weeks Of Treatment: 24 History: Peripheral Venous Disease, Cirrhosis , Type II Diabetes, Clustered Wound: No Osteoarthritis, Osteomyelitis, Neuropathy Photos [name], [name] ([id number]) [phone].pdf Page 7 of 8 Wound Measurements Length: (cm) 4 Width: (cm) 3.5 Depth: (cm) 2 Area: (cm) 10.996 Volume: (cm) 21.991 % Reduction in Area: -164.6% % Reduction in Volume: -5199% Epithelialization: None Tunneling: No Undermining: Yes Starting Position (o'clock): 12 Ending Position (o'clock):  12 Maximum Distance: (cm) 1.5 Wound Description Classification: Grade 2 Wound Margin: Thickened Exudate Amount: Medium Exudate Type: Serosanguineous Exudate Color: red, brown Foul Odor After Cleansing: No Slough/Fibrino No Wound Bed Granulation Amount: Large (67-100%) Exposed Structure Granulation Quality: Red, Pink, Hyper-granulation Fascia Exposed: No Necrotic Amount: None Present (0%) Fat Layer (Subcutaneous Tissue) Exposed: Yes Tendon Exposed: No Muscle Exposed: No Joint Exposed: No  Bone Exposed: No Periwound Skin Texture Texture Color No Abnormalities Noted: No No Abnormalities Noted: No Callus: Yes Atrophie Blanche: No Crepitus: No Cyanosis: No Excoriation: No Ecchymosis: No Induration: No Erythema: No Rash: No Hemosiderin Staining: No Scarring: No Mottled: No Pallor: No Moisture Rubor: No No Abnormalities Noted: No Dry / Scaly: No Temperature / Pain Maceration: No Temperature: No Abnormality Electronic Signature(s) Signed: 10/22/2022 12:14:03 PM By: Fonnie Mu RN Entered By: Fonnie Mu on 10/21/2022 10:55:27 -------------------------------------------------------------------------------- Vitals Details Patient Name: Date of Service: [name] IG 10/21/2022 10:15 A M Medical Record Number: [id number] Patient Account Number: [id number] Date of Birth/Sex: Treating RN: Nov 30, 1960 (62 y.o. [name] Primary Care Christalynn [place]: [name] Other Clinician: Referring Samanthamarie Ezzell: Treating Christoffer Currier/Extender: [name] in Treatment: [address] [place] ([id number]) [id number] Page 8 of 8 Vital Signs Time Taken: 10:47 Temperature (F): 98.1 Height (in): 73 Pulse (bpm): 106 Weight (lbs): 237 Respiratory Rate (breaths/min): 17 Body Mass Index (BMI): 31.3 Blood Pressure (mmHg): 150/88 Reference Range: 80 - 120 mg / dl Electronic Signature(s) Signed: 10/22/2022 12:14:03 PM By: Fonnie Mu RN Entered By: Fonnie Mu on 10/21/2022 10:48:17

## 2022-10-25 ENCOUNTER — Encounter: Payer: Self-pay | Admitting: Internal Medicine

## 2022-10-25 ENCOUNTER — Other Ambulatory Visit: Payer: Self-pay

## 2022-10-25 MED ORDER — AMLODIPINE BESYLATE 5 MG PO TABS: 5.0000 mg | ORAL_TABLET | Freq: Every day | ORAL | 0 refills | Status: DC

## 2022-10-26 MED ORDER — AMLODIPINE BESYLATE 5 MG PO TABS: 5.0000 mg | ORAL_TABLET | Freq: Every day | ORAL | 1 refills | Status: DC

## 2022-10-26 MED ORDER — ACAMPROSATE CALCIUM 333 MG PO TBEC: 333.0000 mg | DELAYED_RELEASE_TABLET | Freq: Two times a day (BID) | ORAL | 1 refills | Status: DC

## 2022-10-27 ENCOUNTER — Other Ambulatory Visit: Payer: Self-pay

## 2022-10-27 ENCOUNTER — Other Ambulatory Visit: Payer: Self-pay | Admitting: Internal Medicine

## 2022-11-01 ENCOUNTER — Encounter (HOSPITAL_BASED_OUTPATIENT_CLINIC_OR_DEPARTMENT_OTHER): Payer: Medicare Other | Admitting: Internal Medicine

## 2022-11-01 DIAGNOSIS — E11621 Type 2 diabetes mellitus with foot ulcer: Secondary | ICD-10-CM | POA: Diagnosis not present

## 2022-11-01 NOTE — Progress Notes (Signed)
[name], [name] ([id number]) [id number] Page 1 of 8 Visit Report for 11/01/2022 Arrival Information Details Patient Name: Date of Service: [name], [name] 11/01/2022 11:00 A M Medical Record Number: [id number] Patient Account Number: [id number] Date of Birth/Sex: Treating RN: [birth year] (62 y.o. M) Primary Care Breigh Annett: [name] Other Clinician: Referring Stefany Starace: Treating Rami Budhu/Extender: Avie Echevaria in Treatment: 25 Visit Information History Since Last Visit Added or deleted any medications: No Patient Arrived: [name] Any new allergies or adverse reactions: No Arrival Time: 10:57 Had a fall or experienced change in No Accompanied By: self activities of daily living that may affect Transfer Assistance: None risk of falls: Patient Identification Verified: Yes Signs or symptoms of abuse/neglect since last visito No Secondary Verification Process Completed: Yes Hospitalized since last visit: No Patient Requires Transmission-Based Precautions: No Implantable device outside of the clinic excluding No Patient Has Alerts: No cellular tissue based products placed in the center since last visit: Has Dressing in Place as Prescribed: Yes Pain Present Now: No Electronic Signature(s) Signed: 11/01/2022 3:27:15 PM By: Thayer Dallas Entered By: Thayer Dallas on 11/01/2022 10:57:41 -------------------------------------------------------------------------------- Encounter Discharge Information Details Patient Name: Date of Service: [name] 11/01/2022 11:00 A M Medical Record Number: [id number] Patient Account Number: [id number] Date of Birth/Sex: Treating RN: 01/30/1961 (62 y.o. [name] Primary Care Brissa Asante: [name] Other Clinician: Referring Shariff Lasky: Treating Ailene Royal/Extender: Avie Echevaria in Treatment: 25 Encounter Discharge Information Items Post Procedure Vitals Discharge Condition:  Stable Temperature (F): 98.2 Ambulatory Status: Cane Pulse (bpm): 103 Discharge Destination: Home Respiratory Rate (breaths/min): 20 Transportation: Private Auto Blood Pressure (mmHg): 149/94 Accompanied By: self Schedule Follow-up Appointment: Yes Clinical Summary of Care: Electronic Signature(s) Signed: 11/01/2022 3:49:37 PM By: Shawn Stall RN, BSN Entered By: Shawn Stall on 11/01/2022 11:32:07 Flurry, [name] ([id number]) [id number] Page 2 of 8 -------------------------------------------------------------------------------- Lower Extremity Assessment Details Patient Name: Date of Service: [name] 11/01/2022 11:00 A M Medical Record Number: [id number] Patient Account Number: [id number] Date of Birth/Sex: Treating RN: 1961/09/19 (62 y.o. M) Primary Care Asahd Can: [name] Other Clinician: Referring Kerra Guilfoil: Treating Izamar Linden/Extender: Avie Echevaria in Treatment: 25 Edema Assessment Assessed: [Left: No] [Right: No] Edema: [Left: N] [Right: o] Calf Left: Right: Point of Measurement: 37 cm From Medial Instep 41 cm Ankle Left: Right: Point of Measurement: 11 cm From Medial Instep 23.5 cm Electronic Signature(s) Signed: 11/01/2022 3:27:15 PM By: Thayer Dallas Entered By: Thayer Dallas on 11/01/2022 11:07:40 -------------------------------------------------------------------------------- Multi Wound Chart Details Patient Name: Date of Service: [name] 11/01/2022 11:00 A M Medical Record Number: [id number] Patient Account Number: [id number] Date of Birth/Sex: Treating RN: 04-07-1961 (62 y.o. M) Primary Care Elisabella Hacker: [name] Other Clinician: Referring Teasia Zapf: Treating Mallie Linnemann/Extender: Avie Echevaria in Treatment: 25 Vital Signs Height(in): 73 Pulse(bpm): 103 Weight(lbs): 237 Blood Pressure(mmHg): 149/94 Body Mass Index(BMI): 31.3 Temperature(F): 98.2 Respiratory  Rate(breaths/min): 18 [5:Photos:] [N/A:N/A] Left, Plantar Foot N/A N/A Wound Location: Gradually Appeared N/A N/A Wounding Event: Diabetic Wound/Ulcer of the Lower N/A N/A Primary Etiology: Extremity Anemia, Sleep Apnea, Congestive N/A N/A Comorbid History: Heart Failure, Hypertension, Peripheral Paolucci, Garion ([id number]) [id number] Page 3 of 8 Venous Disease, Cirrhosis , Type II Diabetes, Osteoarthritis, Osteomyelitis, Neuropathy 10/19/2019 N/A N/A Date Acquired: 25 N/A N/A Weeks of Treatment: Open N/A N/A Wound Status: No N/A N/A Wound Recurrence: Yes N/A N/A Pending A mputation on Presentation: 3.8x3.3x1 N/A N/A Measurements L x W x D (cm) 9.849 N/A N/A A (cm) : rea 9.849 N/A  N/A Volume (cm) : -137.00% N/A N/A % Reduction in A rea: -2273.30% N/A N/A % Reduction in Volume: Grade 2 N/A N/A Classification: Medium N/A N/A Exudate A mount: Serosanguineous N/A N/A Exudate Type: red, brown N/A N/A Exudate Color: Thickened N/A N/A Wound Margin: Large (67-100%) N/A N/A Granulation A mount: Red, Pink, Hyper-granulation N/A N/A Granulation Quality: None Present (0%) N/A N/A Necrotic A mount: Fat Layer (Subcutaneous Tissue): Yes N/A N/A Exposed Structures: Fascia: No Tendon: No Muscle: No Joint: No Bone: No None N/A N/A Epithelialization: Debridement - Excisional N/A N/A Debridement: Pre-procedure Verification/Time Out 11:10 N/A N/A Taken: Lidocaine 4% T opical Solution N/A N/A Pain Control: Callus, Subcutaneous, Slough N/A N/A Tissue Debrided: Skin/Subcutaneous Tissue N/A N/A Level: 14 N/A N/A Debridement A (sq cm): rea Curette N/A N/A Instrument: Moderate N/A N/A Bleeding: Silver Nitrate N/A N/A Hemostasis A chieved: 0 N/A N/A Procedural Pain: 0 N/A N/A Post Procedural Pain: Procedure was tolerated well N/A N/A Debridement Treatment Response: 3.8x3.3x1 N/A N/A Post Debridement Measurements L x W x D  (cm) 9.849 N/A N/A Post Debridement Volume: (cm) Callus: Yes N/A N/A Periwound Skin Texture: Excoriation: No Induration: No Crepitus: No Rash: No Scarring: No Maceration: No N/A N/A Periwound Skin Moisture: Dry/Scaly: No Atrophie Blanche: No N/A N/A Periwound Skin Color: Cyanosis: No Ecchymosis: No Erythema: No Hemosiderin Staining: No Mottled: No Pallor: No Rubor: No No Abnormality N/A N/A Temperature: Debridement N/A N/A Procedures Performed: Treatment Notes Wound #5 (Foot) Wound Laterality: Plantar, Left Cleanser Wound Cleanser Discharge Instruction: Cleanse the wound with wound cleanser prior to applying a clean dressing using gauze sponges, not tissue or cotton balls. Peri-Wound Care Topical topical antibiotics Discharge Instruction: apply directly to wound bed. Primary Dressing KerraCel Ag Gelling Fiber Dressing, 4x5 in (silver alginate) Discharge Instruction: Apply silver alginate over the Blastx and topical antibiotics. Secondary Dressing ABD Andres, Bantz, Dayne ([id number]) [id number] Page 4 of 8 Discharge Instruction: Apply over primary dressing as directed. Drawtex 4x4 in Discharge Instruction: cut to into wound bed over the alginate. Secured With [employer], 4.5x3.1 (in/yd) Discharge Instruction: Secure with Kerlix as directed. 53M Medipore H Soft Cloth Surgical T ape, 4 x 10 (in/yd) Discharge Instruction: Secure with tape as directed. Compression Wrap Compression Stockings Add-Ons Electronic Signature(s) Signed: 11/01/2022 3:35:21 PM By: Linton Ham MD Entered By: Linton Ham on 11/01/2022 12:08:15 -------------------------------------------------------------------------------- Multi-Disciplinary Care Plan Details Patient Name: Date of Service: [name], [name] 11/01/2022 11:00 A M Medical Record Number: [id number] Patient Account Number: [id number] Date of Birth/Sex: Treating RN: Jul 08, 1961 (62 y.o. [name] Primary Care Tysheena Ginzburg: Billey Gosling Other Clinician: Referring Awab Abebe: Treating Elishua Radford/Extender: Genella Rife in Treatment: 25 Active Inactive Necrotic Tissue Nursing Diagnoses: Impaired tissue integrity related to necrotic/devitalized tissue Goals: Necrotic/devitalized tissue will be minimized in the wound bed Date Initiated: 06/11/2022 Target Resolution Date: 11/19/2022 Goal Status: Active Patient/caregiver will verbalize understanding of reason and process for debridement of necrotic tissue Date Initiated: 06/11/2022 Date Inactivated: 08/20/2022 Target Resolution Date: 08/20/2022 Goal Status: Met Interventions: Assess patient pain level pre-, during and post procedure and prior to discharge Treatment Activities: Apply topical anesthetic as ordered : 06/11/2022 Excisional debridement : 06/11/2022 Notes: pale pink tissue- debridement performed by Hayes Rehfeldt. MRSA culture Jujuan Dugo order topical antibiotics. Electronic Signature(s) Signed: 11/01/2022 3:49:37 PM By: Deon Pilling RN, BSN Entered By: Deon Pilling on 11/01/2022 11:15:37 Kist, [name] ([id number]) [id number] Page 5 of 8 -------------------------------------------------------------------------------- Pain Assessment Details Patient Name: Date of Service: JUANMIGUEL, DEFELICE 11/01/2022 11:00  A M Medical Record Number: [id number] Patient Account Number: [id number] Date of Birth/Sex: Treating RN: August 09, 1961 (62 y.o. M) Primary Care Jamespaul Secrist: Billey Gosling Other Clinician: Referring Tomas Schamp: Treating Kae Lauman/Extender: Genella Rife in Treatment: 25 Active Problems Location of Pain Severity and Description of Pain Patient Has Paino No Site Locations Pain Management and Medication Current Pain Management: Electronic Signature(s) Signed: 11/01/2022 3:27:15 PM By: Erenest Blank Entered By: Erenest Blank on 11/01/2022  10:58:15 -------------------------------------------------------------------------------- Patient/Caregiver Education Details Patient Name: Date of Service: [name] 1/15/2024andnbsp11:00 A M Medical Record Number: [id number] Patient Account Number: [id number] Date of Birth/Gender: Treating RN: 01/05/1961 (62 y.o. [name] Primary Care Physician: Billey Gosling Other Clinician: Referring Physician: Treating Physician/Extender: Genella Rife in Treatment: 25 Education Assessment Education Provided To: Patient Education Topics Provided Wound/Skin Impairment: Handouts: Caring for Your Ulcer Methods: Explain/Verbal Responses: Reinforcements needed Crofton, [name] ([id number]) [id number].pdf Page 6 of 8 Electronic Signature(s) Signed: 11/01/2022 3:49:37 PM By: Deon Pilling RN, BSN Entered By: Deon Pilling on 11/01/2022 11:15:53 -------------------------------------------------------------------------------- Wound Assessment Details Patient Name: Date of Service: [name], [name] 11/01/2022 11:00 A M Medical Record Number: [id number] Patient Account Number: [id number] Date of Birth/Sex: Treating RN: 10-31-1960 (62 y.o. M) Primary Care Laveyah Oriol: Billey Gosling Other Clinician: Referring Zylon Creamer: Treating Marinda Tyer/Extender: Genella Rife in Treatment: 25 Wound Status Wound Number: 5 Primary Diabetic Wound/Ulcer of the Lower Extremity Etiology: Wound Location: Left, Plantar Foot Wound Open Wounding Event: Gradually Appeared Status: Date Acquired: 10/19/2019 Comorbid Anemia, Sleep Apnea, Congestive Heart Failure, Hypertension, Weeks Of Treatment: 25 History: Peripheral Venous Disease, Cirrhosis , Type II Diabetes, Clustered Wound: No Osteoarthritis, Osteomyelitis, Neuropathy Pending Amputation On Presentation Photos Wound Measurements Length: (cm) 3.8 Width: (cm) 3.3 Depth: (cm) 1 Area: (cm) 9.849 Volume:  (cm) 9.849 % Reduction in Area: -137% % Reduction in Volume: -2273.3% Epithelialization: None Tunneling: No Undermining: No Wound Description Classification: Grade 2 Wound Margin: Thickened Exudate Amount: Medium Exudate Type: Serosanguineous Exudate Color: red, brown Foul Odor After Cleansing: No Slough/Fibrino No Wound Bed Granulation Amount: Large (67-100%) Exposed Structure Granulation Quality: Red, Pink, Hyper-granulation Fascia Exposed: No Necrotic Amount: None Present (0%) Fat Layer (Subcutaneous Tissue) Exposed: Yes Tendon Exposed: No Muscle Exposed: No Joint Exposed: No Bone Exposed: No Periwound Skin Texture Texture Color No Abnormalities Noted: No No Abnormalities Noted: No Callus: Yes Atrophie BlancheBrayant Dorr, Boling ([id number]) [id number] Page 7 of 8 Crepitus: No Cyanosis: No Excoriation: No Ecchymosis: No Induration: No Erythema: No Rash: No Hemosiderin Staining: No Scarring: No Mottled: No Pallor: No Moisture Rubor: No No Abnormalities Noted: No Dry / Scaly: No Temperature / Pain Maceration: No Temperature: No Abnormality Treatment Notes Wound #5 (Foot) Wound Laterality: Plantar, Left Cleanser Wound Cleanser Discharge Instruction: Cleanse the wound with wound cleanser prior to applying a clean dressing using gauze sponges, not tissue or cotton balls. Peri-Wound Care Topical topical antibiotics Discharge Instruction: apply directly to wound bed. Primary Dressing KerraCel Ag Gelling Fiber Dressing, 4x5 in (silver alginate) Discharge Instruction: Apply silver alginate over the Blastx and topical antibiotics. Secondary Dressing ABD Pad, 5x9 Discharge Instruction: Apply over primary dressing as directed. Drawtex 4x4 in Discharge Instruction: cut to into wound bed over the alginate. Secured With [employer], 4.5x3.1 (in/yd) Discharge Instruction: Secure with Kerlix as directed. 15M Medipore H Soft Cloth  Surgical T ape, 4 x 10 (in/yd) Discharge Instruction: Secure with tape as directed. Compression Wrap Compression Stockings Add-Ons Electronic Signature(s) Signed: 11/01/2022 3:27:15 PM By: Erenest Blank Entered By:  Thayer Dallas on 11/01/2022 11:10:49 -------------------------------------------------------------------------------- Vitals Details Patient Name: Date of Service: [name], [name] 11/01/2022 11:00 A M Medical Record Number: [id number] Patient Account Number: [id number] Date of Birth/Sex: Treating RN: [birth year] (62 y.o. M) Primary Care Leaf [employer]: [name] Other Clinician: Referring Leovardo Thoman: Treating Coraline Talwar/Extender: Avie Echevaria in Treatment: 25 Vital Signs Time Taken: 10:57 Temperature (F): 98.2 Height (in): 73 Pulse (bpm): 103 Weight (lbs): 237 Respiratory Rate (breaths/min): 18 Body Mass Index (BMI): 31.3 Blood Pressure (mmHg): 149/94 Reference Range: 80 - 120 mg / dl Life, Ryden ([id number]) [id number] Page 8 of 8 Electronic Signature(s) Signed: 11/01/2022 3:27:15 PM By: Thayer Dallas Entered By: Thayer Dallas on 11/01/2022 10:58:02

## 2022-11-01 NOTE — Progress Notes (Signed)
BOSTON, COOKSON ([id number]) [id number] Page 1 of 12 Visit Report for 11/01/2022 Debridement Details Patient Name: Date of Service: [name], [name] 11/01/2022 11:00 A M Medical Record Number: [id number] Patient Account Number: [id number] Date of Birth/Sex: Treating RN: [birth year] (62 y.o. M) Primary Care Provider: [name] Other Clinician: Referring Provider: Treating Provider/Extender: Avie Echevaria in Treatment: 25 Debridement Performed for Assessment: Wound #5 Left,Plantar Foot Performed By: Physician Maxwell Caul., MD Debridement Type: Debridement Severity of Tissue Pre Debridement: Fat layer exposed Level of Consciousness (Pre-procedure): Awake and Alert Pre-procedure Verification/Time Out Yes - 11:10 Taken: Start Time: 11:11 Pain Control: Lidocaine 4% T opical Solution T Area Debrided (L x W): otal 4 (cm) x 3.5 (cm) = 14 (cm) Tissue and other material debrided: Viable, Non-Viable, Callus, Slough, Subcutaneous, Skin: Dermis , Skin: Epidermis, Biofilm, Slough Level: Skin/Subcutaneous Tissue Debridement Description: Excisional Instrument: Curette Bleeding: Moderate Hemostasis Achieved: Silver Nitrate End Time: 11:21 Procedural Pain: 0 Post Procedural Pain: 0 Response to Treatment: Procedure was tolerated well Level of Consciousness (Post- Awake and Alert procedure): Post Debridement Measurements of Total Wound Length: (cm) 3.8 Width: (cm) 3.3 Depth: (cm) 1 Volume: (cm) 9.849 Character of Wound/Ulcer Post Debridement: Improved Severity of Tissue Post Debridement: Fat layer exposed Post Procedure Diagnosis Same as Pre-procedure Electronic Signature(s) Signed: 11/01/2022 3:35:21 PM By: Baltazar Najjar MD Entered By: Baltazar Najjar on 11/01/2022 12:08:49 -------------------------------------------------------------------------------- HPI Details Patient Name: Date of Service: [name] [name] 11/01/2022 11:00 A  M Medical Record Number: [id number] Patient Account Number: [id number] Date of Birth/Sex: Treating RN: 16-Mar-1961 (62 y.o. M) Primary Care Provider: [name] Other Clinician: Referring Provider: Treating Provider/Extender: Avie Echevaria in Treatment: 25 History of Present Illness [name], [name] ([id number]) [id number] Page 2 of 12 HPI Description: 10/31/2019 upon evaluation today patient appears to be doing somewhat poorly in regard to his bilateral plantar feet. He has wounds that he tells me have been present since 2012 intermittently off and on. Most recently this has been open for at least the past 6 months to a year. He has been trying to treat this in different ways using Santyl along with various other dressings including Medihoney and even at one point Xeroform. Nothing really has seem to get this completely closed. He was recently in the hospital for cellulitis of his leg subsequently he did have x-rays as well as MRIs that showed negative for any signs of osteomyelitis in regard to the wounds on his feet. Fortunately there is no signs of systemic infection at this time. No fevers, chills, nausea, vomiting, or diarrhea. Patient has previously used Darco offloading shoes as far as frontal floaters as well as postop surgical shoes. He has never been in a total contact cast that may be something we need to strongly consider here. Patient's most recent hemoglobin A1c 1 month ago was 5.3 seems to be very well controlled which is great. Subsequently he has seen vascular as well as podiatry. His ABIs are 1.07 on the left and 1.14 on the right he seems to be doing well he does have chronic venous stasis. 11/07/2019 upon evaluation today patient appears to be doing well with regard to his wounds all things considered. I do not see any severe worsening he still has some callus buildup on the right more than the left he notes that he has been  probably more active than he should as far as walking is concerned is just very hard to not be active. He knows he needs  to be more careful in this regard however. He is willing to give the cast a try at this point although he notes that he is a little nervous about this just with regard to balance although he will be very careful and obviously if he has any trouble he knows to contact the office and let me know. 1/22; patient is in for his obligatory first total contact cast change. Our intake nurse reported a very large amount of drainage which is spelled out over to the surrounding skin. Has bilateral diabetic foot wounds. He has Charcot feet. We have been using silver alginate on his wounds. 11/14/2019 on evaluation today patient is actually seeming to make good improvement in regard to his bilateral plantar foot wounds. We have been using a cast on the left side and on the right side he has been using dressings he is changing up his own accord. With that being said he tells me that he is also not walking as much just due to how unsteady he feels. He takes it easy when he does have to walk and when he does not have to walk he is resting. This is probably help in his right foot as well has the left foot which is actually measuring better. In fact both are measuring better. Overall I am very pleased with how things seem to be progressing. The patient does have some odor on the left foot this does have me concerned about the possibility of infection, and actually probably go ahead and put him on antibiotics today as well as utilizing a continuation of the cast on the left foot I think that will be fine we probably just need to bring him in sooner to change this not last a whole week. 1/29; we brought the patient back today for a total contact cast change on the left out of concern for excessive drainage. We are using drawtex over the wound as the primary dressing 11/21/2019 on evaluation today patient  appears to be doing well with regard to his left plantar foot. In fact both foot ulcers actually seem to be doing pretty well. Nonetheless he is having a lot of drainage on the left at this time and again we did obtain a wound culture did show positive for Staph aureus that was reviewed by myself today as well. Nonetheless he is on Bactrim which was shown to be sensitive that should be helping in this regard. Fortunately there is no signs of infection systemically at this point. 2/5; back in clinic today for a total contact cast change apparently secondary to very significant drainage. Still using drawtex 11/28/2019 upon evaluation today patient appears to be doing well with regard to his wounds. The right foot is doing okay as measured about the same in my opinion. The left foot is actually showing signs of significant improvement is measuring smaller there is a lot of hyper granulation likely due to the continued drainage at this point. We did obtain approval for a snap VAC I think that is good to be appropriate for him and will likely help this tremendously underneath the cast. He is definitely in agreement with proceeding with such. 2/12; patient came in today after his snap VAC lost suction. Brought in to see one of our nurses. The dressing was replaced and then we put the cast back on and rehooked up the snap VAC. Apparently his wound looked very good per our intake nurse. 2/15; again we replaced the cast on Friday. By  Saturday the snap VAC and light suction. He called this morning he comes in acutely. The wounds look fine however the VAC is not functioning. We replaced the cast using silver alginate as the primary dressing backed with Kerramax. The snap VAC was not replaced 12/05/2019 upon evaluation today patient appears to be doing better in regard to his left plantar foot ulcer. Fortunately there is no signs of active infection at this time. Unfortunately he is continuing to have issues with  the right foot he is really not making any progress here things seem to be somewhat stagnant to be honest. The depth has increased but that is due to me having debrided the wound in the past based on what I am seeing. 12/12/2019 on evaluation today patient appears to be doing more poorly in regard to the left lower extremity. He has some erythema spreading up the side of his foot I am concerned about infection again at this point. Unfortunately he has been seeing improvement with a total contact cast but I do not think we should put that on today. On his right plantar foot he continues to have significant drainage this is actually measuring deeper I really do not feel like you are making any progress whatsoever. I have prescribed Granix for him unfortunately his insurance apparently was going to cost him a $500 co-pay. 12/19/2019 upon evaluation today patient actually appears to be doing better in regard to both wounds. With that being said he actually did get the reGranix which he had to pay $500 for. With that being said it does look like that he is actually made some improvement based on what I am seeing at this point with the reGranix. Obviously if he is going to continue this we are going to do something about trying to get him some help in covering the cost. 12/26/2019 on evaluation today patient appears to be doing really much better even compared to last week. Overall the wound seems to be much better even compared to last week and last week was better than the week before. Since has been using the reGranix his symptoms have improved significantly. With that being said the issue right now is simply that this is a very expensive medication for him the first dose cost him $500. Upon inspection patient's wound bed actually is however dramatically improved compared to before he started this 2 weeks ago. 01/02/2020 upon evaluation today patient appears to actually be doing well. He still had a little bit  of reGranix left that has been using in small amounts he just been applying it every other day instead of every day in order to make it go longer. Overall we are still seeing excellent improvement he is measuring smaller looking better healthier tissue and everything seems to be pointing to this headed in the right direction. Fortunately there is no evidence of infection either which is also excellent news. He does have his MRI coming up within the next week. 01/09/2020 upon evaluation today patient appears to be doing a little worse today compared to previous week's evaluation. He is actually been out of the reGranix at this point. He has been trying to make the stretch out so he has been changing the dressings on a regular set schedule like he was previous. I think this has made a difference. Fortunately there is no signs of active infection at this time. No fevers, chills, nausea, vomiting, or diarrhea. 01/16/2020 upon evaluation today patient appears to be doing well with  regard to his left plantar foot ulcer. The right plantar foot still shows some significant depth at this point. Fortunately there is no signs of active infection at this time. 01/30/2020 upon evaluation today patient appears to be doing about the same in regard to his right plantar foot ulcer there is still some depth here and we had to wait till he actually switched over to his new insurance to get approval for the MRI under his new insurance plan. With that being said he now has switched as of April 1. Fortunately there is no signs of active infection at this time. Overall in regard to his left foot ulcer this seems to be doing much better and I am actually very pleased with how things are going. With that being said it is not quite as much progress as we were seeing with the reGranix but at the same time he has had trouble getting this apparently there is been some hindrance here. I Ernie Hew try to actually send this to [facility] that was recommended by the drug rep to me. 02/13/20 upon evaluation today patient appears to be doing better in regard to his left foot ulcer this is great news. Unfortunately the right foot ulcer is not really significantly better at this time. There is no signs of active infection systemically though he did have his MRI which showed unfortunately he does have infection noted including an abscess in the foot. There is also marrow changes noted which are consistent with osteomyelitis based on the radiology review and interpretation. Unfortunately considering that the wound is not really making the progress that we will he would like to be seen I think that this is an indication that he may need some further referral both infectious disease as well as potentially to podiatrist to see if there is anything that can be done to help with the situation that were dealing with here. The left foot again is doing great. READMISSION 06/04/2021 [name], [name] ([id number]) [id number] Page 3 of 12 This is a 62 year old man who was in the clinic in 2021 followed by Allen Derry for areas on the right and left foot. He developed a left foot infection and was referred to ID. He left the clinic in a nonhealed state and was followed for a period of [facility] Marylene Land. Apparently things really deteriorated in early July when he was admitted to hospital from 04/27/2021 through 05/06/2021 with sepsis secondary to a left foot infection. His blood cultures were negative. An MRI suggested fifth metatarsal osteomyelitis a left ankle septic joint. He was treated with vancomycin and ertapenem which he is still taking and may just about be finishing. He was seen by orthopedics and the patient adamantly refused to BKA. As far as I can tell he did not have the ankle aspirated I am not exactly sure what the issue was here. Since he has been discharged she is at [facility]  care for rehabilitation. He was last seen by Dr. Algis Liming on 05/20/2021 he noted osteo of the tibial talar bone cuboid and fifth metatarsal which is even more extensive than what was suggested by the MRI. He is apparently going for a consultation with orthopedic surgery in [place] sometime next week. I received a call about this man 2 weeks ago from Dr. Allyson Sabal who follows him for the possibility of PAD. ABIs I think done in the office showed a ABI on the right of 1.05 at the PTA and 0.99  at the PTA on the left. He had a DVT rule out in the left leg that was negative for the DVT. Past medical history is extensive and includes diastolic heart failure, right first metatarsal head ulcer in 2021, excision of the right second ray by Dr. Marylene Land on 03/13/2020, hypertension, hypothyroidism. Left total hip replacement, right total knee replacement, carpal tunnel syndrome, obstructive sleep apnea alcohol abuse with cirrhosis although the patient denies current alcohol intake. The patient does not think he is a diabetic however looking through [place] link I see 2 HgbAic's of this year that were greater than equal to 6.5 which by definition makes him diabetic. Nevertheless he is not on any treatment and does not check his blood sugars. The patient is now back home out of the nursing home. Saw Dr. Algis Liming last week he was taken off vancomycin and ertapenem on August 22 and now is on doxycycline on Augmentin. He also saw Dr. Weston Anna who is his orthopedic surgeon in [place] he recommended a [employer]	[place]. He has been using Medihoney. 9/6I have been having trouble getting hyperbarics approved through our prior authorization process. Even though he had a limb threatening infection in the left foot and probably the left ankle there glitches in how some of the reports are worded also some of the consultants. In any case I am going to repeat his sedimentation rate and C-reactive protein. I am generally not in favor of  doing things like this as they really do not alter the plan of care from my point of view however I am going to need to demonstrate that these remain high in order to get this through forhyperbaric treatment for chronic refractory osteomyelitis 9/13; following this man for a wound on his left plantar foot in the setting of type 2 diabetes and Charcot deformity. He has underlying chronic refractory osteomyelitis. Follow-up sedimentation rate and C-reactive protein were both elevated but the C-reactive protein was down to 1.4, sedimentation rate at 70. Sedimentation rate was only slightly down from previous at 85. His wound is measuring slightly smaller. 9/20; patient started hyperbaric oxygen therapy today and tolerated treatment well. This is for the underlying osteomyelitis. He remains on antibiotics but thinks he is getting close to finishing. The wound on the plantar aspect of his foot is the other issue we are following here. He is using Medihoney The patient has a Charcot foot in the setting of type 2 diabetes. He is going to need a total contact cast although his partner was away this week and we elected to delay this till next week 9/27; patient still tolerating hyperbaric oxygen well. Wound looked generally healthy not much depth under illumination still 100% covered in fibrinous debris. Raised callused edges around the wound he was prepared for a total contact cast. We have been using Medihoney 9/30; patient is back for his first obligatory total contact cast change. We are using Hydrofera Blue. Noted by our intake nurse to have a lot of drainage or at least a moderate amount of drainage. I am not sure I was previously aware of this 10/4; patient arrives today with a lot of drainage under the cast. When he had it changed last Friday there was also a similar amount of drainage. Her intake nurse says that they tried a wound VAC on him perhaps while I was on vacation in August under the cast but  that did not work. In my experience that has not been unusual. We have been using Hydrofera  Blue with all the secondary absorbers. The drainage today went right through all of our dressings. The patient is concerned about his foot being in a cast without much drainage. He is tolerating HBO well. There has been improvements in the wound in the mid part of his foot in the setting of a Charcot deformity 10/7; patient presents for cast change. He has no issues or complaints today. He denies signs of infection. 10/11; patient presents for cast change. At this time he would like to take a break from the cast. He would like to do daily dressing changes with Hydrofera Blue. He currently denies signs of infection. 10/14; his cast was taken off last week at his request. He arrives in the clinic with [facility]. He has been changing the dressing himself. He has way too much edema in the left foot and leg to consider a total contact cast. I do not really know the issue here. He does have chronic venous insufficiency His wife stopped me in the clinic earlier in the week to report he is drinking again and she is concerned. I am uncertain whether there are other issues 10/18; he arrived in clinic last week having bilateral lower extremity edema likely secondary to chronic venous insufficiency there was too much edema in the left leg to apply a total contact cast. I put him in compression on the left leg to control the swelling. This week he cut the wrap on the foot for reasons that are unclear however today he arrives in clinic with a smaller left leg but massive edema in the left foot. He is supposed to be wearing a juxta lite stocking on the right leg but he is not wearing the contact layer. His attendance at hyperbaric oxygen has been dwindling, he did not dive yesterday and he did not dive today concerned about hyperbarics causing swelling. I looked back in his record his last echo was in 2020 essentially  normal left and right ventricular function. Last BUN and creatinine were done in July this was normal. He is having a lot of drainage in the left plantar foot wound 10/25; again he comes in today having missed HBO yesterday. Macerated skin around the wound which really does not look very good at all. A lot of edema in the left foot but an improvement in edema on the left leg. We wrapped him last week because of the amount of swelling in the left leg. We could not apply a total contact cast. The patient states that he wants to be able to change his dressing himself. I might consider this if he had stockings to control the swelling. He said he be here for HBO tomorrow. I will check the degree of erythema in his forefoot which I have marked. He is on doxycycline and Augmentin which was renewed by Dr. Drucilla Schmidt but I cannot see a follow-up note, follow-up inflammatory markers etc. I 1 point he said he was going to see his orthopedic surgeon in [place] I am not sure if he ever did this. I do not know why he has not followed up with infectious disease, he says he was not given an appointment but he is still on Augmentin doxycycline 11/1; he did not do the lab work I ordered last week. Still on Augmentin and doxycycline. Silver alginate and he is changing this daily using his own juxta lite stocking. The surface of the wound does not look too bad. No real epithelialization however. The patient was  seen today along with HBO 08/26/2021 upon evaluation today patient is being seen at his request by myself he wanted to transfer care to see me. With that being said he has been seeing Dr. Leanord Hawking since he came back in August of this year. Unfortunately he tells me he still having quite a bit of drainage. He is also significant erythema and warmth noted of the foot as well. He has been hit or miss with regard to hyperbaric oxygen therapy tells me that at this point he took this week off because he was extremely  claustrophobic and having a lot of issues he plans to start back next week. Nonetheless he has been given some medication by Dr. Leanord Hawking as well to help calm things down which again may help him. Hopefully he can get back in hyperbarics as I think this is likely necessary. Nonetheless there does seem to be evidence of cellulitis noted today as well and in general I am a little concerned in that regard. His wound unfortunately is significant on this left foot and is right where he takes the brunt of the force secondary to the Charcot arthropathy. I do not think a total contact cast is ideal due to the fact that he unfortunately is draining much too significantly he is also not happy with the idea of using a cast therefore he states he would not want to do that anyway. 09/16/2021 upon evaluation today patient's plantar foot ulcer unfortunately continues to show signs of issues here. He has been in the hospital due to trying to detox himself from alcohol he had withdrawal symptoms and subsequently was hospitalized. During that time it was recommended by both orthopedics as well as infectious disease apparently that he proceed with amputation. With that being said they discussed with him the risk of not doing so. This is well- documented in the encounter. With that being said the patient does not want to proceed down that road and tells me that he declined their advice in that regard. Subsequently our goal then is to try to do what we can to try to get this thing healed and closed. I think this means he is getting need to stay off of it is much as possible he does have a wheelchair at home that he tells me he can use I think that that is going to be something that he does need to do. 09/23/2021 upon evaluation today patient continues to have significant issues here with his foot ulcer which is plantar on the left foot. Subsequently again he does have a history of Charcot foot he also has an issue of having had  osteomyelitis as well as an open wound for some time here. Its been recommended multiple times for him to have an amputation although that is not something that he is really interested or wanting to do. 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 Subsequently this means that he also has a gait abnormality that is causing issues with his walking and [name], [name] ([id number]) [id number] Page 4 of 12 causing abnormal pressures in the central part of his foot this is the reason he has a wound. Nonetheless I want to see what I can do about trying to get a boot to help with stabilization and offloading of working to see what we can do in that regard. 09/30/2021 upon evaluation today patient appears to be doing well with regard to his plantar foot ulcer. He still  has significant granular hypergranulation but again this is much less than what it was even last time I saw him last week. Fortunately I think we are headed in the right direction. This is definitely something we could consider a skin graft or something along those lines if we can get it flattened out enough and get the drainage from being so significant. I feel like we are making some headway here. 10/07/2021 upon evaluation today patient appears to be doing decently well in regard to his wound. Fortunately there does not appear to be any signs of infection overall I feel like it is actually showing some signs of improvement. He still has some hypergranulation but this is much less than its been. 10/21/2021 upon evaluation today patient appears to be doing well with regard to his wound is actually showing some signs of improvement which is great. We have been doing the chemical cauterization with silver nitrate as well as debridement as needed and again has been using silver alginate up to this point. With that being said he does have some Hydrofera Blue at home he wonders if  that can be beneficial at this point. 11/25/2021 upon evaluation today patient appears to be doing somewhat poorly in regard to his plantar foot ulcer. He is also been having some issues with his congestive heart failure. He has recently been in the hospital. I did review those notes as well and apparently his heart failure has been somewhat out of control. He is actually being managed as an outpatient in this regard but nonetheless this is something that can still be kept a close eye on according to the notes and what I see. Fortunately I do not see any signs of active infection at this time which is great news. Nonetheless I am concerned about the boggy central portion of the wound which I think is going to likely open up in the near future. Readmission: 01-27-2022 patient presents for follow-up here in the clinic today. His last been November 25, 2021 since have seen him. At that time we just ordered an MRI fortunately the MRI did not show any obvious signs of osteomyelitis and there were some reactive changes which were questionable and could not be excluded. With that being said in the interim since have seen him back on February 8 things have gotten worse from the standpoint of how the wound appears today. I do think that he is going to require potentially more debridement to clean this wound out than what I can even offer here in the clinic there is a tremendous amount of hypergranulation tissue which is not sufficient to grow new tissue over and honestly I think this is going to lead to a delay in healing if its not taking care of. I need to see if I can get him into see a surgeon to get an opinion on whether or not they could take him to the OR for an aggressive surgical debridement to clear out this hypergranulation tissue and achieve hemostasis which is can be the biggest issue with me here in the clinic as he does tend to bleed even with very light superficial debridements. The patient's not  opposed to this and he states that if I have someone that like for him to see he would be happy to go. Otherwise his medical history really has not changed. He does tell me he is getting ready to go for an evaluation for CPAP machine. Readmission 05/04/2022 Patient was last seen 3  months ago. He states he went to rehab for alcohol abuse. He continues to have a wound on the plantar aspect of the left foot. He states this has never completely healed over the past several years. He is following with podiatry for this issue currently and they are recommending blast X with collagen and a cam boot. He has not started this yet. He has home health. Currently he is using silver alginate. He also follows with Dr. Algis Liming infectious disease who ordered an MRI of the foot as he is concerned for osteomyelitis. CRP and sed rate are elevated. He is currently on Augmentin for prophylaxis as he has a history of osteomyelitis to this foot. Infectious disease has recommended amputation. Currently patient denies systemic signs of infection. 8/7; patient is following up with podiatry on 8/11. He wanted to come in to be debrided. He has no issues or complaints today. He has been using silver alginate to the wound bed. He has home health that helps change the dressings. He had an MRI completed on 7/18 that did not show evidence of osteomyelitis. He is scheduled to see infectious disease on 8/16. 8/25; Patient is following with podiatry for his wound care and he is currently using silver alginate and a defender boot for his Left foot wound. He would like to have debridement to the wound but is unable to see podiatry until the end of the month. He has asked if we could debride him. 9/21; patient follows with podiatry for his foot wound but likes to come into our clinic for debridements. He was last seen on 06/24/2022 by Dr. Allena Katz, podiatry. He is currently using blast X to the wound bed. He has been using a Psychologist, forensic. He is  not scheduled to see podiatry until 10/11 and comes in for debridement today. 10/9; patient presents for follow-up. He states he is no longer following with podiatry. He has been doing silver alginate to the wound bed. He states he would like to attempt a total contact cast but starting next week. He currently denies systemic signs of infection. 10/16; patient presents for follow-up. He has been using silver alginate to the wound bed. We discussed having the cast placed today and patient would like to proceed with this. He has follow-up in 2 days for his cast exchange. He currently denies signs of infection. 10/18; total contact cast placed 2 days ago. He comes back in for the obligatory total contact cast change HOWEVER our intake nurse noted copious drainage soaking right through the dressings and asked me to look at this. Indeed there was a large amount of drainage soaking right through all of the dressings that were applied. I cannot imagine that this would last 7 days. Even if we change the cast twice a week I would be concerned. We worked on this patient through the late part of 2022 in the early 2023 for this same wound. We attempted a cast at that time I think even a cast with a wound VAC and for 1 reason or another we could never make this progress towards closure. Fortunately his recent MRI did not show osteomyelitis. 10/26; patient presents for follow-up. He has been using silver alginate to the wound bed. He has no issues or complaints today. 11/3; wound edges are macerated secondary to copious drainage. PCR culture came back showing MRSA this has been sent to [facility] but we do not yet have that available.. Nursing reports still copious drainage 11/17; patient presents for follow-up.  He states he received Keystone antibiotic ointment last week and has been using this With silver alginate. He reports improvement in drainage. He has no issues or complaints today. 12/1; patient presents for  follow-up. He has been using Keystone antibiotic ointment with silver alginate. He reports continued improvement in drainage. We discussed the total contact cast today. For now he would like to hold off until later in the month. 12/14; patient presents for follow-up. He has been using Keystone antibiotic ointment with blast X and silver alginate. He states he would like to defer the total contact cast until the first of the year. He has been using his Psychologist, forensicdefender boot. He currently denies signs of infection. He reports low amount of drainage. 1/4; patient presents for follow-up. He has been using Keystone antibiotic ointment and silver alginate. He has been on his feet more lately and reports that the drainage has increased again. His wound has declined in appearance. He currently denies signs of infection. He does not want to attempt the total contact cast. 1/15; patient has been using Keystone antibiotic and silver alginate on a left plantar foot wound that has been chronic. His MRI was last done in July 2023 that did not show evidence of osteomyelitis. His arterial studies have been normal. We have previously treated him with hyperbaric oxygen for chronic refractory osteomyelitis. We have attempted total contact casting but ran into trouble with excessive drainage. He was treated with antibiotics in 2022 as directed by Dr. Algis LimingVandam of infectious disease. He is using a surgical shoe for offloading. Likely has Charcot foot in this area Electronic Signature(s) Signed: 11/01/2022 3:35:21 PM By: Baltazar Najjarobson, Aerabella Galasso MD East AllianceMILLER, [name] ([id number]) [id number] Page 5 of 12 Entered By: Baltazar Najjarobson, Jaymie Misch on 11/01/2022 12:13:01 -------------------------------------------------------------------------------- Physical Exam Details Patient Name: Date of Service: [name], [name] 11/01/2022 11:00 A M Medical Record Number: [id number] Patient Account Number: [id number] Date of Birth/Sex: Treating  RN: 01/13/1961 (62 y.o. M) Primary Care Provider: Cheryll CockayneBurns, Stacy Other Clinician: Referring Provider: Treating Provider/Extender: Avie Echevariaobson, Brylynn Hanssen Burns, Stacy Weeks in Treatment: 25 Constitutional Patient is hypertensive.. Pulse regular and within target range for patient.Marland Kitchen. Respirations regular, non-labored and within target range.. Temperature is normal and within the target range for the patient.Marland Kitchen. Appears in no distress. Notes Wound exam; lifeless wound in the left plantar midfoot. Boggy tissue over the surface callus thick skin around the edges. There is some degree of undermining from about 11-6 o'clock. There is no evidence of surrounding infection. There are divots in this but nothing reveals exposed bone. No obvious infection around the wound Electronic Signature(s) Signed: 11/01/2022 3:35:21 PM By: Baltazar Najjarobson, Leana Springston MD Entered By: Baltazar Najjarobson, Cobey Raineri on 11/01/2022 12:14:53 -------------------------------------------------------------------------------- Physician Orders Details Patient Name: Date of Service: [name] OxfordMILLER, [name] 11/01/2022 11:00 A M Medical Record Number: [id number] Patient Account Number: [id number] Date of Birth/Sex: Treating RN: 03/22/1961 (62 y.o. [name]) Deaton, Bobbi Primary Care Provider: Cheryll CockayneBurns, Stacy Other Clinician: Referring Provider: Treating Provider/Extender: Avie Echevariaobson, Dewitt Judice Burns, Stacy Weeks in Treatment: 25 Verbal / Phone Orders: No Diagnosis Coding ICD-10 Coding Code Description E11.621 Type 2 diabetes mellitus with foot ulcer L97.522 Non-pressure chronic ulcer of other part of left foot with fat layer exposed M86.372 Chronic multifocal osteomyelitis, left ankle and foot M14.672 Charcot's joint, left ankle and foot Follow-up Appointments ppointment in 1 week. - Dr. Mikey BussingHoffman and [name] KendallBobbi 11/08/2022 315pm room 8 Return A ppointment in 2 weeks. - Dr. Mikey BussingHoffman and [name] KendallBobbi 11/15/2022 315pm room 8 Return A Other: - continue topical antibiotics Keystone  Antibiotics. patient  would like to hold the cast today. Anesthetic (In clinic) Topical Lidocaine 4% applied to wound bed Cellular or Tissue Based Products Cellular or Tissue Based Product Type: - Run IVR for Grafix=20% Copay Edema Control - Lymphedema / SCD / Other Avoid [name] for long periods of time. Exercise regularly Moisturize legs daily. [name], [name] ([id number]) [id number] Page 6 of 12 Compression stocking or Garment 30-40 mm/Hg pressure to: - wear the Juxtalite HD to right and left leg. apply in the morning and remove at night. Off-Loading Other: - HOld the Defender Boot: minimal weight bearing left foot use wheelchair for mobility. Peg assist with surgical shoe for offloading for now. Additional Orders / Instructions Follow Nutritious Diet Wound Treatment Wound #5 - Foot Wound Laterality: Plantar, Left Cleanser: Wound Cleanser (Generic) 1 x Per Day/30 Days Discharge Instructions: Cleanse the wound with wound cleanser prior to applying a clean dressing using gauze sponges, not tissue or cotton balls. Topical: topical antibiotics 1 x Per Day/30 Days Discharge Instructions: apply directly to wound bed. Prim Dressing: KerraCel Ag Gelling Fiber Dressing, 4x5 in (silver alginate) (Generic) 1 x Per Day/30 Days ary Discharge Instructions: Apply silver alginate over the Blastx and topical antibiotics. Secondary Dressing: ABD Pad, 5x9 (Generic) 1 x Per Day/30 Days Discharge Instructions: Apply over primary dressing as directed. Secondary Dressing: Drawtex 4x4 in 1 x Per Day/30 Days Discharge Instructions: cut to into wound bed over the alginate. Secured With: [employer] Group[employer]erlix Roll Sterile, 4.5x3.1 (in/yd) (Generic) 1 x Per Day/30 Days Discharge Instructions: Secure with Kerlix as directed. Secured With: 28M Medipore H Soft Cloth Surgical T ape, 4 x 10 (in/yd) (Generic) 1 x Per Day/30 Days Discharge Instructions: Secure with tape as directed. Electronic  Signature(s) Signed: 11/01/2022 3:35:21 PM By: Baltazar Najjarobson, Maryam Feely MD Signed: 11/01/2022 3:49:37 PM By: Shawn Stalleaton, Bobbi RN, BSN Entered By: Shawn Stalleaton, Bobbi on 11/01/2022 11:31:07 -------------------------------------------------------------------------------- Problem List Details Patient Name: Date of Service: [name] OxfordMILLER, [name] 11/01/2022 11:00 A M Medical Record Number: [id number] Patient Account Number: [id number] Date of Birth/Sex: Treating RN: 03/05/1961 (62 y.o. [name]) Deaton, Bobbi Primary Care Provider: Cheryll CockayneBurns, Stacy Other Clinician: Referring Provider: Treating Provider/Extender: Avie Echevariaobson, Tameika Heckmann Burns, Stacy Weeks in Treatment: 25 Active Problems ICD-10 Encounter Code Description Active Date MDM Diagnosis E11.621 Type 2 diabetes mellitus with foot ulcer 05/04/2022 No Yes L97.522 Non-pressure chronic ulcer of other part of left foot with fat layer exposed 05/04/2022 No Yes M86.372 Chronic multifocal osteomyelitis, left ankle and foot 05/04/2022 No Yes M14.672 Charcot's joint, left ankle and foot 05/04/2022 No Yes [name], [name] ([id number]) [id number] Page 7 of 12 Inactive Problems Resolved Problems Electronic Signature(s) Signed: 11/01/2022 3:35:21 PM By: Baltazar Najjarobson, Srijan Givan MD Entered By: Baltazar Najjarobson, Niani Mourer on 11/01/2022 12:08:07 -------------------------------------------------------------------------------- Progress Note Details Patient Name: Date of Service: [name] OxfordMILLER, [name] 11/01/2022 11:00 A M Medical Record Number: [id number] Patient Account Number: [id number] Date of Birth/Sex: Treating RN: 05/20/1961 (62 y.o. M) Primary Care Provider: Cheryll CockayneBurns, Stacy Other Clinician: Referring Provider: Treating Provider/Extender: Avie Echevariaobson, Chesnee Floren Burns, Stacy Weeks in Treatment: 25 Subjective History of Present Illness (HPI) 10/31/2019 upon evaluation today patient appears to be doing somewhat poorly in regard to his bilateral plantar feet. He has wounds that he tells me have  been present since 2012 intermittently off and on. Most recently this has been open for at least the past 6 months to a year. He has been trying to treat this in different ways using Santyl along with various other dressings including Medihoney and even at one point Xeroform. Nothing really has seem to get  this completely closed. He was recently in the hospital for cellulitis of his leg subsequently he did have x-rays as well as MRIs that showed negative for any signs of osteomyelitis in regard to the wounds on his feet. Fortunately there is no signs of systemic infection at this time. No fevers, chills, nausea, vomiting, or diarrhea. Patient has previously used Darco offloading shoes as far as frontal floaters as well as postop surgical shoes. He has never been in a total contact cast that may be something we need to strongly consider here. Patient's most recent hemoglobin A1c 1 month ago was 5.3 seems to be very well controlled which is great. Subsequently he has seen vascular as well as podiatry. His ABIs are 1.07 on the left and 1.14 on the right he seems to be doing well he does have chronic venous stasis. 11/07/2019 upon evaluation today patient appears to be doing well with regard to his wounds all things considered. I do not see any severe worsening he still has some callus buildup on the right more than the left he notes that he has been probably more active than he should as far as walking is concerned is just very hard to not be active. He knows he needs to be more careful in this regard however. He is willing to give the cast a try at this point although he notes that he is a little nervous about this just with regard to balance although he will be very careful and obviously if he has any trouble he knows to contact the office and let me know. 1/22; patient is in for his obligatory first total contact cast change. Our intake nurse reported a very large amount of drainage which is spelled out  over to the surrounding skin. Has bilateral diabetic foot wounds. He has Charcot feet. We have been using silver alginate on his wounds. 11/14/2019 on evaluation today patient is actually seeming to make good improvement in regard to his bilateral plantar foot wounds. We have been using a cast on the left side and on the right side he has been using dressings he is changing up his own accord. With that being said he tells me that he is also not walking as much just due to how unsteady he feels. He takes it easy when he does have to walk and when he does not have to walk he is resting. This is probably help in his right foot as well has the left foot which is actually measuring better. In fact both are measuring better. Overall I am very pleased with how things seem to be progressing. The patient does have some odor on the left foot this does have me concerned about the possibility of infection, and actually probably go ahead and put him on antibiotics today as well as utilizing a continuation of the cast on the left foot I think that will be fine we probably just need to bring him in sooner to change this not last a whole week. 1/29; we brought the patient back today for a total contact cast change on the left out of concern for excessive drainage. We are using drawtex over the wound as the primary dressing 11/21/2019 on evaluation today patient appears to be doing well with regard to his left plantar foot. In fact both foot ulcers actually seem to be doing pretty well. Nonetheless he is having a lot of drainage on the left at this time and again we did obtain a wound  culture did show positive for Staph aureus that was reviewed by myself today as well. Nonetheless he is on Bactrim which was shown to be sensitive that should be helping in this regard. Fortunately there is no signs of infection systemically at this point. 2/5; back in clinic today for a total contact cast change apparently secondary to  very significant drainage. Still using drawtex 11/28/2019 upon evaluation today patient appears to be doing well with regard to his wounds. The right foot is doing okay as measured about the same in my opinion. The left foot is actually showing signs of significant improvement is measuring smaller there is a lot of hyper granulation likely due to the continued drainage at this point. We did obtain approval for a snap VAC I think that is good to be appropriate for him and will likely help this tremendously underneath the cast. He is definitely in agreement with proceeding with such. 2/12; patient came in today after his snap VAC lost suction. Brought in to see one of our nurses. The dressing was replaced and then we put the cast back on and rehooked up the snap VAC. Apparently his wound looked very good per our intake nurse. 2/15; again we replaced the cast on Friday. By Saturday the snap VAC and light suction. He called this morning he comes in acutely. The wounds look fine however the VAC is not functioning. We replaced the cast using silver alginate as the primary dressing backed with Kerramax. The snap VAC was not replaced 12/05/2019 upon evaluation today patient appears to be doing better in regard to his left plantar foot ulcer. Fortunately there is no signs of active infection at this time. Unfortunately he is continuing to have issues with the right foot he is really not making any progress here things seem to be somewhat stagnant to be honest. The depth has increased but that is due to me having debrided the wound in the past based on what I am seeing. 12/12/2019 on evaluation today patient appears to be doing more poorly in regard to the left lower extremity. He has some erythema spreading up the side of his foot I am concerned about infection again at this point. Unfortunately he has been seeing improvement with a total contact cast but I do not think we should put that on today. On his right  plantar foot he continues to have significant drainage this is actually measuring deeper I really do not feel like you are making any progress whatsoever. I have prescribed Granix for him unfortunately his insurance apparently was going to cost him a $500 co-pay. 12/19/2019 upon evaluation today patient actually appears to be doing better in regard to both wounds. With that being said he actually did get the reGranix which [name], [name] ([id number]) [id number] Page 8 of 12 he had to pay $500 for. With that being said it does look like that he is actually made some improvement based on what I am seeing at this point with the reGranix. Obviously if he is going to continue this we are going to do something about trying to get him some help in covering the cost. 12/26/2019 on evaluation today patient appears to be doing really much better even compared to last week. Overall the wound seems to be much better even compared to last week and last week was better than the week before. Since has been using the reGranix his symptoms have improved significantly. With that being said the issue right now is simply  that this is a very expensive medication for him the first dose cost him $500. Upon inspection patient's wound bed actually is however dramatically improved compared to before he started this 2 weeks ago. 01/02/2020 upon evaluation today patient appears to actually be doing well. He still had a little bit of reGranix left that has been using in small amounts he just been applying it every other day instead of every day in order to make it go longer. Overall we are still seeing excellent improvement he is measuring smaller looking better healthier tissue and everything seems to be pointing to this headed in the right direction. Fortunately there is no evidence of infection either which is also excellent news. He does have his MRI coming up within the next week. 01/09/2020 upon  evaluation today patient appears to be doing a little worse today compared to previous week's evaluation. He is actually been out of the reGranix at this point. He has been trying to make the stretch out so he has been changing the dressings on a regular set schedule like he was previous. I think this has made a difference. Fortunately there is no signs of active infection at this time. No fevers, chills, nausea, vomiting, or diarrhea. 01/16/2020 upon evaluation today patient appears to be doing well with regard to his left plantar foot ulcer. The right plantar foot still shows some significant depth at this point. Fortunately there is no signs of active infection at this time. 01/30/2020 upon evaluation today patient appears to be doing about the same in regard to his right plantar foot ulcer there is still some depth here and we had to wait till he actually switched over to his new insurance to get approval for the MRI under his new insurance plan. With that being said he now has switched as of April 1. Fortunately there is no signs of active infection at this time. Overall in regard to his left foot ulcer this seems to be doing much better and I am actually very pleased with how things are going. With that being said it is not quite as much progress as we were seeing with the reGranix but at the same time he has had trouble getting this apparently there is been some hindrance here. I Ernie Hew try to actually send [facility] to [facility] that was recommended by the drug rep to me. 02/13/20 upon evaluation today patient appears to be doing better in regard to his left foot ulcer this is great news. Unfortunately the right foot ulcer is not really significantly better at this time. There is no signs of active infection systemically though he did have his MRI which showed unfortunately he does have infection noted including an abscess in the foot. There is also marrow changes noted which are consistent with  osteomyelitis based on the radiology review and interpretation. Unfortunately considering that the wound is not really making the progress that we will he would like to be seen I think that this is an indication that he may need some further referral both infectious disease as well as potentially to podiatrist to see if there is anything that can be done to help with the situation that were dealing with here. The left foot again is doing great. READMISSION 06/04/2021 This is a 63 year old man who was in the clinic in 2021 followed by Allen Derry for areas on the right and left foot. He developed a left foot infection and was referred to ID. He left the clinic  in a nonhealed state and was followed for a period of [facility] Marylene Land. Apparently things really deteriorated in early July when he was admitted to hospital from 04/27/2021 through 05/06/2021 with sepsis secondary to a left foot infection. His blood cultures were negative. An MRI suggested fifth metatarsal osteomyelitis a left ankle septic joint. He was treated with vancomycin and ertapenem which he is still taking and may just about be finishing. He was seen by orthopedics and the patient adamantly refused to BKA. As far as I can tell he did not have the ankle aspirated I am not exactly sure what the issue was here. Since he has been discharged she is at [facility] for rehabilitation. He was last seen by Dr. Algis Liming on 05/20/2021 he noted osteo of the tibial talar bone cuboid and fifth metatarsal which is even more extensive than what was suggested by the MRI. He is apparently going for a consultation with orthopedic surgery in [place] sometime next week. I received a call about this man 2 weeks ago from Dr. Allyson Sabal who follows him for the possibility of PAD. ABIs I think done in the office showed a ABI on the right of 1.05 at the PTA and 0.99 at the PTA on the left. He had a DVT rule out in the left leg that was  negative for the DVT. Past medical history is extensive and includes diastolic heart failure, right first metatarsal head ulcer in 2021, excision of the right second ray by Dr. Marylene Land on 03/13/2020, hypertension, hypothyroidism. Left total hip replacement, right total knee replacement, carpal tunnel syndrome, obstructive sleep apnea alcohol abuse with cirrhosis although the patient denies current alcohol intake. The patient does not think he is a diabetic however looking through [place] link I see 2 HgbAic's of this year that were greater than equal to 6.5 which by definition makes him diabetic. Nevertheless he is not on any treatment and does not check his blood sugars. The patient is now back home out of the nursing home. Saw Dr. Algis Liming last week he was taken off vancomycin and ertapenem on August 22 and now is on doxycycline on Augmentin. He also saw Dr. Weston Anna who is his orthopedic surgeon in [place] he recommended a [employer]	[place]. He has been using Medihoney. 9/6I have been having trouble getting hyperbarics approved through our prior authorization process. Even though he had a limb threatening infection in the left foot and probably the left ankle there glitches in how some of the reports are worded also some of the consultants. In any case I am going to repeat his sedimentation rate and C-reactive protein. I am generally not in favor of doing things like this as they really do not alter the plan of care from my point of view however I am going to need to demonstrate that these remain high in order to get this through forhyperbaric treatment for chronic refractory osteomyelitis 9/13; following this man for a wound on his left plantar foot in the setting of type 2 diabetes and Charcot deformity. He has underlying chronic refractory osteomyelitis. Follow-up sedimentation rate and C-reactive protein were both elevated but the C-reactive protein was down to 1.4, sedimentation rate at  70. Sedimentation rate was only slightly down from previous at 85. His wound is measuring slightly smaller. 9/20; patient started hyperbaric oxygen therapy today and tolerated treatment well. This is for the underlying osteomyelitis. He remains on antibiotics but thinks he is getting close to finishing.  The wound on the plantar aspect of his foot is the other issue we are following here. He is using Medihoney The patient has a Charcot foot in the setting of type 2 diabetes. He is going to need a total contact cast although his partner was away this week and we elected to delay this till next week 9/27; patient still tolerating hyperbaric oxygen well. Wound looked generally healthy not much depth under illumination still 100% covered in fibrinous debris. Raised callused edges around the wound he was prepared for a total contact cast. We have been using Medihoney 9/30; patient is back for his first obligatory total contact cast change. We are using Hydrofera Blue. Noted by our intake nurse to have a lot of drainage or at least a moderate amount of drainage. I am not sure I was previously aware of this 10/4; patient arrives today with a lot of drainage under the cast. When he had it changed last Friday there was also a similar amount of drainage. Her intake nurse says that they tried a wound VAC on him perhaps while I was on vacation in August under the cast but that did not work. In my experience that has not been unusual. We have been using Hydrofera Blue with all the secondary absorbers. The drainage today went right through all of our dressings. The patient is concerned about his foot being in a cast without much drainage. He is tolerating HBO well. There has been improvements in the wound in the mid part of his foot in the setting of a Charcot deformity 10/7; patient presents for cast change. He has no issues or complaints today. He denies signs of infection. 10/11; patient presents for cast  change. At this time he would like to take a break from the cast. He would like to do daily dressing changes with Hydrofera Blue. He currently denies signs of infection. 10/14; his cast was taken off last week at his request. He arrives in the clinic with [facility]. He has been changing the dressing himself. He has way too much edema in the left foot and leg to consider a total contact cast. I do not really know the issue here. He does have chronic venous insufficiency His wife stopped me in the clinic earlier in the week to report he is drinking again and she is concerned. I am uncertain whether there are other issues 10/18; he arrived in clinic last week having bilateral lower extremity edema likely secondary to chronic venous insufficiency there was too much edema in the left leg to apply a total contact cast. I put him in compression on the left leg to control the swelling. This week he cut the wrap on the foot for reasons that are [name], GRANHOLM ([id number]) [id number] Page 9 of 12 unclear however today he arrives in clinic with a smaller left leg but massive edema in the left foot. He is supposed to be wearing a juxta lite stocking on the right leg but he is not wearing the contact layer. His attendance at hyperbaric oxygen has been dwindling, he did not dive yesterday and he did not dive today concerned about hyperbarics causing swelling. I looked back in his record his last echo was in 2020 essentially normal left and right ventricular function. Last BUN and creatinine were done in July this was normal. He is having a lot of drainage in the left plantar foot wound 10/25; again he comes in today having missed HBO yesterday. Macerated skin around  the wound which really does not look very good at all. A lot of edema in the left foot but an improvement in edema on the left leg. We wrapped him last week because of the amount of swelling in the left leg. We could not  apply a total contact cast. The patient states that he wants to be able to change his dressing himself. I might consider this if he had stockings to control the swelling. He said he be here for HBO tomorrow. I will check the degree of erythema in his forefoot which I have marked. He is on doxycycline and Augmentin which was renewed by Dr. Algis Liming but I cannot see a follow-up note, follow-up inflammatory markers etc. I 1 point he said he was going to see his orthopedic surgeon in [place] I am not sure if he ever did this. I do not know why he has not followed up with infectious disease, he says he was not given an appointment but he is still on Augmentin doxycycline 11/1; he did not do the lab work I ordered last week. Still on Augmentin and doxycycline. Silver alginate and he is changing this daily using his own juxta lite stocking. The surface of the wound does not look too bad. No real epithelialization however. The patient was seen today along with HBO 08/26/2021 upon evaluation today patient is being seen at his request by myself he wanted to transfer care to see me. With that being said he has been seeing Dr. Leanord Hawking since he came back in August of this year. Unfortunately he tells me he still having quite a bit of drainage. He is also significant erythema and warmth noted of the foot as well. He has been hit or miss with regard to hyperbaric oxygen therapy tells me that at this point he took this week off because he was extremely claustrophobic and having a lot of issues he plans to start back next week. Nonetheless he has been given some medication by Dr. Leanord Hawking as well to help calm things down which again may help him. Hopefully he can get back in hyperbarics as I think this is likely necessary. Nonetheless there does seem to be evidence of cellulitis noted today as well and in general I am a little concerned in that regard. His wound unfortunately is significant on this left foot and is right  where he takes the brunt of the force secondary to the Charcot arthropathy. I do not think a total contact cast is ideal due to the fact that he unfortunately is draining much too significantly he is also not happy with the idea of using a cast therefore he states he would not want to do that anyway. 09/16/2021 upon evaluation today patient's plantar foot ulcer unfortunately continues to show signs of issues here. He has been in the hospital due to trying to detox himself from alcohol he had withdrawal symptoms and subsequently was hospitalized. During that time it was recommended by both orthopedics as well as infectious disease apparently that he proceed with amputation. With that being said they discussed with him the risk of not doing so. This is well- documented in the encounter. With that being said the patient does not want to proceed down that road and tells me that he declined their advice in that regard. Subsequently our goal then is to try to do what we can to try to get this thing healed and closed. I think this means he is getting need to stay  off of it is much as possible he does have a wheelchair at home that he tells me he can use I think that that is going to be something that he does need to do. 09/23/2021 upon evaluation today patient continues to have significant issues here with his foot ulcer which is plantar on the left foot. Subsequently again he does have a history of Charcot foot he also has an issue of having had osteomyelitis as well as an open wound for some time here. Its been recommended multiple times for him to have an amputation although that is not something that he is really interested or wanting to do. 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 Subsequently this means that he also has a gait abnormality that is causing issues with his walking and causing abnormal pressures in the central part of his foot this is the  reason he has a wound. Nonetheless I want to see what I can do about trying to get a boot to help with stabilization and offloading of working to see what we can do in that regard. 09/30/2021 upon evaluation today patient appears to be doing well with regard to his plantar foot ulcer. He still has significant granular hypergranulation but again this is much less than what it was even last time I saw him last week. Fortunately I think we are headed in the right direction. This is definitely something we could consider a skin graft or something along those lines if we can get it flattened out enough and get the drainage from being so significant. I feel like we are making some headway here. 10/07/2021 upon evaluation today patient appears to be doing decently well in regard to his wound. Fortunately there does not appear to be any signs of infection overall I feel like it is actually showing some signs of improvement. He still has some hypergranulation but this is much less than its been. 10/21/2021 upon evaluation today patient appears to be doing well with regard to his wound is actually showing some signs of improvement which is great. We have been doing the chemical cauterization with silver nitrate as well as debridement as needed and again has been using silver alginate up to this point. With that being said he does have some Hydrofera Blue at home he wonders if that can be beneficial at this point. 11/25/2021 upon evaluation today patient appears to be doing somewhat poorly in regard to his plantar foot ulcer. He is also been having some issues with his congestive heart failure. He has recently been in the hospital. I did review those notes as well and apparently his heart failure has been somewhat out of control. He is actually being managed as an outpatient in this regard but nonetheless this is something that can still be kept a close eye on according to the notes and what I see. Fortunately I do  not see any signs of active infection at this time which is great news. Nonetheless I am concerned about the boggy central portion of the wound which I think is going to likely open up in the near future. Readmission: 01-27-2022 patient presents for follow-up here in the clinic today. His last been November 25, 2021 since have seen him. At that time we just ordered an MRI fortunately the MRI did not show any obvious signs of osteomyelitis and there were some reactive changes which were questionable and could not be excluded. With that  being said in the interim since have seen him back on February 8 things have gotten worse from the standpoint of how the wound appears today. I do think that he is going to require potentially more debridement to clean this wound out than what I can even offer here in the clinic there is a tremendous amount of hypergranulation tissue which is not sufficient to grow new tissue over and honestly I think this is going to lead to a delay in healing if its not taking care of. I need to see if I can get him into see a surgeon to get an opinion on whether or not they could take him to the OR for an aggressive surgical debridement to clear out this hypergranulation tissue and achieve hemostasis which is can be the biggest issue with me here in the clinic as he does tend to bleed even with very light superficial debridements. The patient's not opposed to this and he states that if I have someone that like for him to see he would be happy to go. Otherwise his medical history really has not changed. He does tell me he is getting ready to go for an evaluation for CPAP machine. Readmission 05/04/2022 Patient was last seen 3 months ago. He states he went to rehab for alcohol abuse. He continues to have a wound on the plantar aspect of the left foot. He states this has never completely healed over the past several years. He is following with podiatry for this issue currently and they are  recommending blast X with collagen and a cam boot. He has not started this yet. He has home health. Currently he is using silver alginate. He also follows with Dr. Algis Liming infectious disease who ordered an MRI of the foot as he is concerned for osteomyelitis. CRP and sed rate are elevated. He is currently on Augmentin for prophylaxis as he has a history of osteomyelitis to this foot. Infectious disease has recommended amputation. Currently patient denies systemic signs of infection. 8/7; patient is following up with podiatry on 8/11. He wanted to come in to be debrided. He has no issues or complaints today. He has been using silver alginate to the wound bed. He has home health that helps change the dressings. He had an MRI completed on 7/18 that did not show evidence of osteomyelitis. He is scheduled to see infectious disease on 8/16. 8/25; Patient is following with podiatry for his wound care and he is currently using silver alginate and a defender boot for his Left foot wound. He would like to have debridement to the wound but is unable to see podiatry until the end of the month. He has asked if we could debride him. 9/21; patient follows with podiatry for his foot wound but likes to come into our clinic for debridements. He was last seen on 06/24/2022 by Dr. Allena Katz, podiatry. He is currently using blast X to the wound bed. He has been using a Psychologist, forensic. He is not scheduled to see podiatry until 10/11 and comes in for debridement today. 10/9; patient presents for follow-up. He states he is no longer following with podiatry. He has been doing silver alginate to the wound bed. He states he would like to attempt a total contact cast but starting next week. He currently denies systemic signs of infection. [name], [name] ([id number]) [id number] Page 10 of 12 10/16; patient presents for follow-up. He has been using silver alginate to the wound bed. We discussed having the cast  placed today and patient would like to proceed with this. He has follow-up in 2 days for his cast exchange. He currently denies signs of infection. 10/18; total contact cast placed 2 days ago. He comes back in for the obligatory total contact cast change HOWEVER our intake nurse noted copious drainage soaking right through the dressings and asked me to look at this. Indeed there was a large amount of drainage soaking right through all of the dressings that were applied. I cannot imagine that this would last 7 days. Even if we change the cast twice a week I would be concerned. We worked on this patient through the late part of 2022 in the early 2023 for this same wound. We attempted a cast at that time I think even a cast with a wound VAC and for 1 reason or another we could never make this progress towards closure. Fortunately his recent MRI did not show osteomyelitis. 10/26; patient presents for follow-up. He has been using silver alginate to the wound bed. He has no issues or complaints today. 11/3; wound edges are macerated secondary to copious drainage. PCR culture came back showing MRSA this has been sent to [facility] but we do not yet have that available.. Nursing reports still copious drainage 11/17; patient presents for follow-up. He states he received Keystone antibiotic ointment last week and has been using this With silver alginate. He reports improvement in drainage. He has no issues or complaints today. 12/1; patient presents for follow-up. He has been using Keystone antibiotic ointment with silver alginate. He reports continued improvement in drainage. We discussed the total contact cast today. For now he would like to hold off until later in the month. 12/14; patient presents for follow-up. He has been using Keystone antibiotic ointment with blast X and silver alginate. He states he would like to defer the total contact cast until the first of the year. He has been using his Insurance risk surveyor. He currently denies signs of infection. He reports low amount of drainage. 1/4; patient presents for follow-up. He has been using Keystone antibiotic ointment and silver alginate. He has been on his feet more lately and reports that the drainage has increased again. His wound has declined in appearance. He currently denies signs of infection. He does not want to attempt the total contact cast. 1/15; patient has been using Keystone antibiotic and silver alginate on a left plantar foot wound that has been chronic. His MRI was last done in July 2023 that did not show evidence of osteomyelitis. His arterial studies have been normal. We have previously treated him with hyperbaric oxygen for chronic refractory osteomyelitis. We have attempted total contact casting but ran into trouble with excessive drainage. He was treated with antibiotics in 2022 as directed by Dr. Algis Liming of infectious disease. He is using a surgical shoe for offloading. Likely has Charcot foot in this area Objective Constitutional Patient is hypertensive.. Pulse regular and within target range for patient.Marland Kitchen Respirations regular, non-labored and within target range.. Temperature is normal and within the target range for the patient.Marland Kitchen Appears in no distress. Vitals Time Taken: 10:57 AM, Height: 73 in, Weight: 237 lbs, BMI: 31.3, Temperature: 98.2 F, Pulse: 103 bpm, Respiratory Rate: 18 breaths/min, Blood Pressure: 149/94 mmHg. General Notes: Wound exam; lifeless wound in the left plantar midfoot. Boggy tissue over the surface callus thick skin around the edges. There is some degree of undermining from about 11-6 o'clock. There is no evidence of surrounding infection. There are divots  in this but nothing reveals exposed bone. No obvious infection around the wound Integumentary (Hair, Skin) Wound #5 status is Open. Original cause of wound was Gradually Appeared. The date acquired was: 10/19/2019. The wound has been in treatment 25  weeks. The wound is located on the Left,Plantar Foot. The wound measures 3.8cm length x 3.3cm width x 1cm depth; 9.849cm^2 area and 9.849cm^3 volume. There is Fat Layer (Subcutaneous Tissue) exposed. There is no tunneling or undermining noted. There is a medium amount of serosanguineous drainage noted. The wound margin is thickened. There is large (67-100%) red, pink, hyper - granulation within the wound bed. There is no necrotic tissue within the wound bed. The periwound skin appearance exhibited: Callus. The periwound skin appearance did not exhibit: Crepitus, Excoriation, Induration, Rash, Scarring, Dry/Scaly, Maceration, Atrophie Blanche, Cyanosis, Ecchymosis, Hemosiderin Staining, Mottled, Pallor, Rubor, Erythema. Periwound temperature was noted as No Abnormality. Assessment Active Problems ICD-10 Type 2 diabetes mellitus with foot ulcer Non-pressure chronic ulcer of other part of left foot with fat layer exposed Chronic multifocal osteomyelitis, left ankle and foot Charcot's joint, left ankle and foot Procedures Wound #5 Pre-procedure diagnosis of Wound #5 is a Diabetic Wound/Ulcer of the Lower Extremity located on the Left,Plantar Foot .Severity of Tissue Pre Debridement is: Fat layer exposed. There was a Excisional Skin/Subcutaneous Tissue Debridement with a total area of 14 sq cm performed by Maxwell Caul., MD. With the following instrument(s): Curette to remove Viable and Non-Viable tissue/material. Material removed includes Callus, Subcutaneous Tissue, [place], [place] ([id number]) [id number] Page 11 of 12 Skin: Dermis, Skin: Epidermis, and Biofilm after achieving pain control using Lidocaine 4% T opical Solution. A time out was conducted at 11:10, prior to the start of the procedure. A Moderate amount of bleeding was controlled with Silver Nitrate. The procedure was tolerated well with a pain level of 0 throughout and a pain level of 0 following  the procedure. Post Debridement Measurements: 3.8cm length x 3.3cm width x 1cm depth; 9.849cm^3 volume. Character of Wound/Ulcer Post Debridement is improved. Severity of Tissue Post Debridement is: Fat layer exposed. Post procedure Diagnosis Wound #5: Same as Pre-Procedure Plan Follow-up Appointments: Return Appointment in 1 week. - Dr. Mikey Bussing and [facility] 11/08/2022 315pm room 8 Return Appointment in 2 weeks. - Dr. Mikey Bussing and [name] 11/15/2022 315pm room 8 Other: - continue topical antibiotics [facility]. patient would like to hold the cast today. Anesthetic: (In clinic) Topical Lidocaine 4% applied to wound bed Cellular or Tissue Based Products: Cellular or Tissue Based Product Type: - Run IVR for Grafix=20% Copay Edema Control - Lymphedema / SCD / Other: Avoid [name] for long periods of time. Exercise regularly Moisturize legs daily. Compression stocking or Garment 30-40 mm/Hg pressure to: - wear the Juxtalite HD to right and left leg. apply in the morning and remove at night. Off-Loading: Other: - HOld the Defender Boot: minimal weight bearing left foot use wheelchair for mobility. Peg assist with surgical shoe for offloading for now. Additional Orders / Instructions: Follow Nutritious Diet WOUND #5: - Foot Wound Laterality: Plantar, Left Cleanser: Wound Cleanser (Generic) 1 x Per Day/30 Days Discharge Instructions: Cleanse the wound with wound cleanser prior to applying a clean dressing using gauze sponges, not tissue or cotton balls. Topical: topical antibiotics 1 x Per Day/30 Days Discharge Instructions: apply directly to wound bed. Prim Dressing: KerraCel Ag Gelling Fiber Dressing, 4x5 in (silver alginate) (Generic) 1 x Per Day/30 Days ary Discharge Instructions: Apply silver alginate over the Blastx and topical antibiotics.  Secondary Dressing: ABD Pad, 5x9 (Generic) 1 x Per Day/30 Days Discharge Instructions: Apply over primary dressing as directed. Secondary  Dressing: Drawtex 4x4 in 1 x Per Day/30 Days Discharge Instructions: cut to into wound bed over the alginate. Secured With: [employer], 4.5x3.1 (in/yd) (Generic) 1 x Per Day/30 Days Discharge Instructions: Secure with Kerlix as directed. Secured With: 54M Medipore H Soft Cloth Surgical T ape, 4 x 10 (in/yd) (Generic) 1 x Per Day/30 Days Discharge Instructions: Secure with tape as directed. 1. No real improvement in this chronic wound which has the appearance of a lifeless surface. I used a #5 curette to remove callus and thick skin from the edges of this and took off some surface and subcutaneous material which is fibrinous. Hemostasis with copious amounts of silver nitrate and Gelfoam. There is no exposed bone but there is bony protuberance which is easily palpable. 2. I looked back on his record. He does not have an arterial issue. Most recent MRI of July 2023 did not show evidence of osteomyelitis although we have treated for him in this clinic I think dating back into 2022. 3. I offered to put a total contact cast on him however he reminds me about excessive amounts of drainage and indeed looking back on this that was the case. We have the option of either changing him frequently or a coexistent wound VAC 4. I wonder about an operative procedure to shave down the bone underneath the wound edge probably in conjunction with an aggressive debridement of the surface Electronic Signature(s) Signed: 11/01/2022 3:35:21 PM By: Baltazar Najjar MD Entered By: Baltazar Najjar on 11/01/2022 12:18:52 -------------------------------------------------------------------------------- SuperBill Details Patient Name: Date of Service: [name] [name] 11/01/2022 Medical Record Number: [id number] Patient Account Number: [id number] Date of Birth/Sex: Treating RN: Feb 25, 1961 (62 y.o. [name] Primary Care Provider: [name] Other Clinician: Referring Provider: Treating Provider/Extender:  Avie Echevaria in Treatment: [address] [place] ([id number]) [id number] Page 12 of 12 Diagnosis Coding ICD-10 Codes Code Description E11.621 Type 2 diabetes mellitus with foot ulcer L97.522 Non-pressure chronic ulcer of other part of left foot with fat layer exposed M86.372 Chronic multifocal osteomyelitis, left ankle and foot M14.672 Charcot's joint, left ankle and foot Facility Procedures : CPT4 Code: 62831517 Description: 11042 - DEB SUBQ TISSUE 20 SQ CM/< ICD-10 Diagnosis Description L97.522 Non-pressure chronic ulcer of other part of left foot with fat layer exposed Modifier: Quantity: 1 Physician Procedures : CPT4 Code Description Modifier [id number] 11042 - WC PHYS SUBQ TISS 20 SQ CM ICD-10 Diagnosis Description L97.522 Non-pressure chronic ulcer of other part of left foot with fat layer exposed Quantity: 1 Electronic Signature(s) Signed: 11/01/2022 3:35:21 PM By: Baltazar Najjar MD Entered By: Baltazar Najjar on 11/01/2022 12:19:13

## 2022-11-04 ENCOUNTER — Other Ambulatory Visit: Payer: Self-pay | Admitting: Internal Medicine

## 2022-11-07 ENCOUNTER — Ambulatory Visit
Admission: RE | Admit: 2022-11-07 | Discharge: 2022-11-07 | Disposition: A | Discharge status: Home / Self Care | Payer: Medicare Other | Source: Ambulatory Visit | Attending: Rheumatology | Admitting: Rheumatology

## 2022-11-07 DIAGNOSIS — M549 Dorsalgia, unspecified: Secondary | ICD-10-CM

## 2022-11-08 ENCOUNTER — Encounter (HOSPITAL_BASED_OUTPATIENT_CLINIC_OR_DEPARTMENT_OTHER): Payer: Medicare Other | Admitting: Internal Medicine

## 2022-11-08 DIAGNOSIS — L97522 Non-pressure chronic ulcer of other part of left foot with fat layer exposed: Secondary | ICD-10-CM | POA: Diagnosis not present

## 2022-11-08 DIAGNOSIS — E11621 Type 2 diabetes mellitus with foot ulcer: Secondary | ICD-10-CM | POA: Diagnosis not present

## 2022-11-08 DIAGNOSIS — M14672 Charcot's joint, left ankle and foot: Secondary | ICD-10-CM

## 2022-11-08 DIAGNOSIS — M86372 Chronic multifocal osteomyelitis, left ankle and foot: Secondary | ICD-10-CM | POA: Diagnosis not present

## 2022-11-09 NOTE — Progress Notes (Signed)
JAYVON, MOUNGER ([id number]) [id number] Page 1 of 13 Visit Report for 11/08/2022 Chief Complaint Document Details Patient Name: Date of Service: [name], [name] 11/08/2022 3:15 PM Medical Record Number: [id number] Patient Account Number: [id number] Date of Birth/Sex: Treating RN: 1961/05/01 (62 y.o. M) Primary Care Provider: [name] Other Clinician: Referring Provider: Treating Provider/Extender: [name] in Treatment: 60 Information Obtained from: Patient Chief Complaint Left foot ulcer Electronic Signature(s) Signed: 11/09/2022 10:49:17 AM By: [name] DO Entered By: [name] on 11/08/2022 16:10:50 -------------------------------------------------------------------------------- HPI Details Patient Name: Date of Service: DRURY, ARDIZZONE IG 11/08/2022 3:15 PM Medical Record Number: [id number] Patient Account Number: [id number] Date of Birth/Sex: Treating RN: 05/09/1961 (62 y.o. M) Primary Care Provider: [name] Other Clinician: Referring Provider: Treating Provider/Extender: [name] in Treatment: 1 History of Present Illness HPI Description: 10/31/2019 upon evaluation today patient appears to be doing somewhat poorly in regard to his bilateral plantar feet. He has wounds that he tells me have been present since 2012 intermittently off and on. Most recently this has been open for at least the past 6 months to a year. He has been trying to treat this in different ways using Santyl along with various other dressings including Medihoney and even at one point Xeroform. Nothing really has seem to get this completely closed. He was recently in the hospital for cellulitis of his leg subsequently he did have x-rays as well as MRIs that showed negative for any signs of osteomyelitis in regard to the wounds on his feet. Fortunately there is no signs of systemic infection at this time. No fevers,  chills, nausea, vomiting, or diarrhea. Patient has previously used Darco offloading shoes as far as frontal floaters as well as postop surgical shoes. He has never been in a total contact cast that may be something we need to strongly consider here. Patient's most recent hemoglobin A1c 1 month ago was 5.3 seems to be very well controlled which is great. Subsequently he has seen vascular as well as podiatry. His ABIs are 1.07 on the left and 1.14 on the right he seems to be doing well he does have chronic venous stasis. 11/07/2019 upon evaluation today patient appears to be doing well with regard to his wounds all things considered. I do not see any severe worsening he still has some callus buildup on the right more than the left he notes that he has been probably more active than he should as far as walking is concerned is just very hard to not be active. He knows he needs to be more careful in this regard however. He is willing to give the cast a try at this point although he notes that he is a little nervous about this just with regard to balance although he will be very careful and obviously if he has any trouble he knows to contact the office and let me know. 1/22; patient is in for his obligatory first total contact cast change. Our intake nurse reported a very large amount of drainage which is spelled out over to the surrounding skin. Has bilateral diabetic foot wounds. He has Charcot feet. We have been using silver alginate on his wounds. 11/14/2019 on evaluation today patient is actually seeming to make good improvement in regard to his bilateral plantar foot wounds. We have been using a cast on the left side and on the right side he has been using dressings he is changing up his own accord. With that being said  he tells me that he is also not walking as much just due to how unsteady he feels. He takes it easy when he does have to walk and when he does not have to walk he is resting. This is  probably help in his right foot as well has the left foot which is actually measuring better. In fact both are measuring better. Overall I am very pleased with how things seem to be progressing. The patient does have some odor on the left foot this does have me concerned about the possibility of infection, and actually probably go ahead and put him on antibiotics today as well as utilizing a continuation of the cast on the left foot I think that will be fine we probably just need to bring him in sooner to change this not last a whole week. 1/29; we brought the patient back today for a total contact cast change on the left out of concern for excessive drainage. We are using drawtex over the wound as the primary dressing 11/21/2019 on evaluation today patient appears to be doing well with regard to his left plantar foot. In fact both foot ulcers actually seem to be doing pretty well. Nonetheless he is having a lot of drainage on the left at this time and again we did obtain a wound culture did show positive for Staph aureus that was reviewed by myself today as well. Nonetheless he is on Bactrim which was shown to be sensitive that should be helping in this regard. Fortunately there is no signs of BYRNE, CAPEK ([id number]) [id number] Page 2 of 13 infection systemically at this point. 2/5; back in clinic today for a total contact cast change apparently secondary to very significant drainage. Still using drawtex 11/28/2019 upon evaluation today patient appears to be doing well with regard to his wounds. The right foot is doing okay as measured about the same in my opinion. The left foot is actually showing signs of significant improvement is measuring smaller there is a lot of hyper granulation likely due to the continued drainage at this point. We did obtain approval for a snap VAC I think that is good to be appropriate for him and will likely help this tremendously underneath  the cast. He is definitely in agreement with proceeding with such. 2/12; patient came in today after his snap VAC lost suction. Brought in to see one of our nurses. The dressing was replaced and then we put the cast back on and rehooked up the snap VAC. Apparently his wound looked very good per our intake nurse. 2/15; again we replaced the cast on Friday. By Saturday the snap VAC and light suction. He called this morning he comes in acutely. The wounds look fine however the VAC is not functioning. We replaced the cast using silver alginate as the primary dressing backed with Kerramax. The snap VAC was not replaced 12/05/2019 upon evaluation today patient appears to be doing better in regard to his left plantar foot ulcer. Fortunately there is no signs of active infection at this time. Unfortunately he is continuing to have issues with the right foot he is really not making any progress here things seem to be somewhat stagnant to be honest. The depth has increased but that is due to me having debrided the wound in the past based on what I am seeing. 12/12/2019 on evaluation today patient appears to be doing more poorly in regard to the left lower extremity. He has some erythema spreading up the  side of his foot I am concerned about infection again at this point. Unfortunately he has been seeing improvement with a total contact cast but I do not think we should put that on today. On his right plantar foot he continues to have significant drainage this is actually measuring deeper I really do not feel like you are making any progress whatsoever. I have prescribed Granix for him unfortunately his insurance apparently was going to cost him a $500 co-pay. 12/19/2019 upon evaluation today patient actually appears to be doing better in regard to both wounds. With that being said he actually did get the reGranix which he had to pay $500 for. With that being said it does look like that he is actually made some  improvement based on what I am seeing at this point with the reGranix. Obviously if he is going to continue this we are going to do something about trying to get him some help in covering the cost. 12/26/2019 on evaluation today patient appears to be doing really much better even compared to last week. Overall the wound seems to be much better even compared to last week and last week was better than the week before. Since has been using the reGranix his symptoms have improved significantly. With that being said the issue right now is simply that this is a very expensive medication for him the first dose cost him $500. Upon inspection patient's wound bed actually is however dramatically improved compared to before he started this 2 weeks ago. 01/02/2020 upon evaluation today patient appears to actually be doing well. He still had a little bit of reGranix left that has been using in small amounts he just been applying it every other day instead of every day in order to make it go longer. Overall we are still seeing excellent improvement he is measuring smaller looking better healthier tissue and everything seems to be pointing to this headed in the right direction. Fortunately there is no evidence of infection either which is also excellent news. He does have his MRI coming up within the next week. 01/09/2020 upon evaluation today patient appears to be doing a little worse today compared to previous week's evaluation. He is actually been out of the reGranix at this point. He has been trying to make the stretch out so he has been changing the dressings on a regular set schedule like he was previous. I think this has made a difference. Fortunately there is no signs of active infection at this time. No fevers, chills, nausea, vomiting, or diarrhea. 01/16/2020 upon evaluation today patient appears to be doing well with regard to his left plantar foot ulcer. The right plantar foot still shows some  significant depth at this point. Fortunately there is no signs of active infection at this time. 01/30/2020 upon evaluation today patient appears to be doing about the same in regard to his right plantar foot ulcer there is still some depth here and we had to wait till he actually switched over to his new insurance to get approval for the MRI under his new insurance plan. With that being said he now has switched as of April 1. Fortunately there is no signs of active infection at this time. Overall in regard to his left foot ulcer this seems to be doing much better and I am actually very pleased with how things are going. With that being said it is not quite as much progress as we were seeing with the reGranix but at  the same time he has had trouble getting this apparently there is been some hindrance here. I Ernie Hew try to actually send this to [facility] that was recommended by the drug rep to me. 02/13/20 upon evaluation today patient appears to be doing better in regard to his left foot ulcer this is great news. Unfortunately the right foot ulcer is not really significantly better at this time. There is no signs of active infection systemically though he did have his MRI which showed unfortunately he does have infection noted including an abscess in the foot. There is also marrow changes noted which are consistent with osteomyelitis based on the radiology review and interpretation. Unfortunately considering that the wound is not really making the progress that we will he would like to be seen I think that this is an indication that he may need some further referral both infectious disease as well as potentially to podiatrist to see if there is anything that can be done to help with the situation that were dealing with here. The left foot again is doing great. READMISSION 06/04/2021 This is a 62 year old man who was in the clinic in 2021 followed by Allen Derry for areas on the right and left foot.  He developed a left foot infection and was referred to ID. He left the clinic in a nonhealed state and was followed for a period of [facility] Marylene Land. Apparently things really deteriorated in early July when he was admitted to hospital from 04/27/2021 through 05/06/2021 with sepsis secondary to a left foot infection. His blood cultures were negative. An MRI suggested fifth metatarsal osteomyelitis a left ankle septic joint. He was treated with vancomycin and ertapenem which he is still taking and may just about be finishing. He was seen by orthopedics and the patient adamantly refused to BKA. As far as I can tell he did not have the ankle aspirated I am not exactly sure what the issue was here. Since he has been discharged she is at [facility] health care for rehabilitation. He was last seen by Dr. Algis Liming on 05/20/2021 he noted osteo of the tibial talar bone cuboid and fifth metatarsal which is even more extensive than what was suggested by the MRI. He is apparently going for a consultation with orthopedic surgery in [place] sometime next week. I received a call about this man 2 weeks ago from Dr. Allyson Sabal who follows him for the possibility of PAD. ABIs I think done in the office showed a ABI on the right of 1.05 at the PTA and 0.99 at the PTA on the left. He had a DVT rule out in the left leg that was negative for the DVT. Past medical history is extensive and includes diastolic heart failure, right first metatarsal head ulcer in 2021, excision of the right second ray by Dr. Marylene Land on 03/13/2020, hypertension, hypothyroidism. Left total hip replacement, right total knee replacement, carpal tunnel syndrome, obstructive sleep apnea alcohol abuse with cirrhosis although the patient denies current alcohol intake. The patient does not think he is a diabetic however looking through [place] link I see 2 HgbAic's of this year that were greater than equal to 6.5 which by definition makes him  diabetic. Nevertheless he is not on any treatment and does not check his blood sugars. The patient is now back home out of the nursing home. Saw Dr. Algis Liming last week he was taken off vancomycin and ertapenem on August 22 and now is on doxycycline on  Augmentin. He also saw Dr. Weston Anna who is his orthopedic surgeon in [place] he recommended a [employer]	[place]. He has been using Medihoney. 9/6I have been having trouble getting hyperbarics approved through our prior authorization process. Even though he had a limb threatening infection in the left foot and probably the left ankle there glitches in how some of the reports are worded also some of the consultants. In any case I am going to repeat his sedimentation rate and C-reactive protein. I am generally not in favor of doing things like this as they really do not alter the plan of care from my point of view however I am going to need to demonstrate that these remain high in order to get this through forhyperbaric treatment for chronic refractory osteomyelitis 9/13; following this man for a wound on his left plantar foot in the setting of type 2 diabetes and Charcot deformity. He has underlying chronic refractory osteomyelitis. Follow-up sedimentation rate and C-reactive protein were both elevated but the C-reactive protein was down to 1.4, sedimentation rate at 70. Sedimentation rate was only slightly down from previous at 85. His wound is measuring slightly smaller. 9/20; patient started hyperbaric oxygen therapy today and tolerated treatment well. This is for the underlying osteomyelitis. He remains on antibiotics but thinks he is getting close to finishing. The wound on the plantar aspect of his foot is the other issue we are following here. He is using Medihoney The patient has a Charcot foot in the setting of type 2 diabetes. He is going to need a total contact cast although his partner was away this week and we elected to delay this till next  week ERBY, SANDERSON ([id number]) [id number] Page 3 of 13 9/27; patient still tolerating hyperbaric oxygen well. Wound looked generally healthy not much depth under illumination still 100% covered in fibrinous debris. Raised callused edges around the wound he was prepared for a total contact cast. We have been using Medihoney 9/30; patient is back for his first obligatory total contact cast change. We are using Hydrofera Blue. Noted by our intake nurse to have a lot of drainage or at least a moderate amount of drainage. I am not sure I was previously aware of this 10/4; patient arrives today with a lot of drainage under the cast. When he had it changed last Friday there was also a similar amount of drainage. Her intake nurse says that they tried a wound VAC on him perhaps while I was on vacation in August under the cast but that did not work. In my experience that has not been unusual. We have been using Hydrofera Blue with all the secondary absorbers. The drainage today went right through all of our dressings. The patient is concerned about his foot being in a cast without much drainage. He is tolerating HBO well. There has been improvements in the wound in the mid part of his foot in the setting of a Charcot deformity 10/7; patient presents for cast change. He has no issues or complaints today. He denies signs of infection. 10/11; patient presents for cast change. At this time he would like to take a break from the cast. He would like to do daily dressing changes with Hydrofera Blue. He currently denies signs of infection. 10/14; his cast was taken off last week at his request. He arrives in the clinic with [facility]. He has been changing the dressing himself. He has way too much edema in the left foot and leg to consider a  total contact cast. I do not really know the issue here. He does have chronic venous insufficiency His wife stopped me in the clinic earlier in  the week to report he is drinking again and she is concerned. I am uncertain whether there are other issues 10/18; he arrived in clinic last week having bilateral lower extremity edema likely secondary to chronic venous insufficiency there was too much edema in the left leg to apply a total contact cast. I put him in compression on the left leg to control the swelling. This week he cut the wrap on the foot for reasons that are unclear however today he arrives in clinic with a smaller left leg but massive edema in the left foot. He is supposed to be wearing a juxta lite stocking on the right leg but he is not wearing the contact layer. His attendance at hyperbaric oxygen has been dwindling, he did not dive yesterday and he did not dive today concerned about hyperbarics causing swelling. I looked back in his record his last echo was in 2020 essentially normal left and right ventricular function. Last BUN and creatinine were done in July this was normal. He is having a lot of drainage in the left plantar foot wound 10/25; again he comes in today having missed HBO yesterday. Macerated skin around the wound which really does not look very good at all. A lot of edema in the left foot but an improvement in edema on the left leg. We wrapped him last week because of the amount of swelling in the left leg. We could not apply a total contact cast. The patient states that he wants to be able to change his dressing himself. I might consider this if he had stockings to control the swelling. He said he be here for HBO tomorrow. I will check the degree of erythema in his forefoot which I have marked. He is on doxycycline and Augmentin which was renewed by Dr. Algis LimingVandam but I cannot see a follow-up note, follow-up inflammatory markers etc. I 1 point he said he was going to see his orthopedic surgeon in [place] I am not sure if he ever did this. I do not know why he has not followed up with infectious disease, he says he  was not given an appointment but he is still on Augmentin doxycycline 11/1; he did not do the lab work I ordered last week. Still on Augmentin and doxycycline. Silver alginate and he is changing this daily using his own juxta lite stocking. The surface of the wound does not look too bad. No real epithelialization however. The patient was seen today along with HBO 08/26/2021 upon evaluation today patient is being seen at his request by myself he wanted to transfer care to see me. With that being said he has been seeing Dr. Leanord Hawkingobson since he came back in August of this year. Unfortunately he tells me he still having quite a bit of drainage. He is also significant erythema and warmth noted of the foot as well. He has been hit or miss with regard to hyperbaric oxygen therapy tells me that at this point he took this week off because he was extremely claustrophobic and having a lot of issues he plans to start back next week. Nonetheless he has been given some medication by Dr. Leanord Hawkingobson as well to help calm things down which again may help him. Hopefully he can get back in hyperbarics as I think this is likely necessary. Nonetheless there does  seem to be evidence of cellulitis noted today as well and in general I am a little concerned in that regard. His wound unfortunately is significant on this left foot and is right where he takes the brunt of the force secondary to the Charcot arthropathy. I do not think a total contact cast is ideal due to the fact that he unfortunately is draining much too significantly he is also not happy with the idea of using a cast therefore he states he would not want to do that anyway. 09/16/2021 upon evaluation today patient's plantar foot ulcer unfortunately continues to show signs of issues here. He has been in the hospital due to trying to detox himself from alcohol he had withdrawal symptoms and subsequently was hospitalized. During that time it was recommended by both orthopedics  as well as infectious disease apparently that he proceed with amputation. With that being said they discussed with him the risk of not doing so. This is well- documented in the encounter. With that being said the patient does not want to proceed down that road and tells me that he declined their advice in that regard. Subsequently our goal then is to try to do what we can to try to get this thing healed and closed. I think this means he is getting need to stay off of it is much as possible he does have a wheelchair at home that he tells me he can use I think that that is going to be something that he does need to do. 09/23/2021 upon evaluation today patient continues to have significant issues here with his foot ulcer which is plantar on the left foot. Subsequently again he does have a history of Charcot foot he also has an issue of having had osteomyelitis as well as an open wound for some time here. Its been recommended multiple times for him to have an amputation although that is not something that he is really interested or wanting to do. 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 Subsequently this means that he also has a gait abnormality that is causing issues with his walking and causing abnormal pressures in the central part of his foot this is the reason he has a wound. Nonetheless I want to see what I can do about trying to get a boot to help with stabilization and offloading of working to see what we can do in that regard. 09/30/2021 upon evaluation today patient appears to be doing well with regard to his plantar foot ulcer. He still has significant granular hypergranulation but again this is much less than what it was even last time I saw him last week. Fortunately I think we are headed in the right direction. This is definitely something we could consider a skin graft or something along those lines if we can get it flattened out enough and get  the drainage from being so significant. I feel like we are making some headway here. 10/07/2021 upon evaluation today patient appears to be doing decently well in regard to his wound. Fortunately there does not appear to be any signs of infection overall I feel like it is actually showing some signs of improvement. He still has some hypergranulation but this is much less than its been. 10/21/2021 upon evaluation today patient appears to be doing well with regard to his wound is actually showing some signs of improvement which is great. We have been doing the chemical cauterization with silver  nitrate as well as debridement as needed and again has been using silver alginate up to this point. With that being said he does have some Hydrofera Blue at home he wonders if that can be beneficial at this point. 11/25/2021 upon evaluation today patient appears to be doing somewhat poorly in regard to his plantar foot ulcer. He is also been having some issues with his congestive heart failure. He has recently been in the hospital. I did review those notes as well and apparently his heart failure has been somewhat out of control. He is actually being managed as an outpatient in this regard but nonetheless this is something that can still be kept a close eye on according to the notes and what I see. Fortunately I do not see any signs of active infection at this time which is great news. Nonetheless I am concerned about the boggy central portion of the wound which I think is going to likely open up in the near future. Readmission: 01-27-2022 patient presents for follow-up here in the clinic today. His last been November 25, 2021 since have seen him. At that time we just ordered an MRI fortunately the MRI did not show any obvious signs of osteomyelitis and there were some reactive changes which were questionable and could not be excluded. With that being said in the interim since have seen him back on February 8 things  have gotten worse from the standpoint of how the wound appears today. I do think that he is going to require potentially more debridement to clean this wound out than what I can even offer here in the clinic there is a tremendous amount of hypergranulation tissue which is not sufficient to grow new tissue over and honestly I think this is going to lead to a delay in healing if its not taking care of. I need to see if I can get him into see a surgeon to get an opinion on whether or not they could take him to the OR for an aggressive surgical debridement to clear out this hypergranulation tissue and achieve hemostasis which is can be the biggest issue with me here in the clinic as he does tend to bleed even with very light superficial debridements. The patient's not opposed to this and he states that if I have someone that like for him to see he would be happy to go. Otherwise his medical history really has not changed. He does tell me he is getting ready to go for an evaluation for CPAP machine. EISEN, ROBENSON ([id number]) [id number] Page 4 of 13 Readmission 05/04/2022 Patient was last seen 3 months ago. He states he went to rehab for alcohol abuse. He continues to have a wound on the plantar aspect of the left foot. He states this has never completely healed over the past several years. He is following with podiatry for this issue currently and they are recommending blast X with collagen and a cam boot. He has not started this yet. He has home health. Currently he is using silver alginate. He also follows with Dr. Algis Liming infectious disease who ordered an MRI of the foot as he is concerned for osteomyelitis. CRP and sed rate are elevated. He is currently on Augmentin for prophylaxis as he has a history of osteomyelitis to this foot. Infectious disease has recommended amputation. Currently patient denies systemic signs of infection. 8/7; patient is following up with podiatry on  8/11. He wanted to come in to be debrided. He has no  issues or complaints today. He has been using silver alginate to the wound bed. He has home health that helps change the dressings. He had an MRI completed on 7/18 that did not show evidence of osteomyelitis. He is scheduled to see infectious disease on 8/16. 8/25; Patient is following with podiatry for his wound care and he is currently using silver alginate and a defender boot for his Left foot wound. He would like to have debridement to the wound but is unable to see podiatry until the end of the month. He has asked if we could debride him. 9/21; patient follows with podiatry for his foot wound but likes to come into our clinic for debridements. He was last seen on 06/24/2022 by Dr. Allena Katz, podiatry. He is currently using blast X to the wound bed. He has been using a Psychologist, forensic. He is not scheduled to see podiatry until 10/11 and comes in for debridement today. 10/9; patient presents for follow-up. He states he is no longer following with podiatry. He has been doing silver alginate to the wound bed. He states he would like to attempt a total contact cast but starting next week. He currently denies systemic signs of infection. 10/16; patient presents for follow-up. He has been using silver alginate to the wound bed. We discussed having the cast placed today and patient would like to proceed with this. He has follow-up in 2 days for his cast exchange. He currently denies signs of infection. 10/18; total contact cast placed 2 days ago. He comes back in for the obligatory total contact cast change HOWEVER our intake nurse noted copious drainage soaking right through the dressings and asked me to look at this. Indeed there was a large amount of drainage soaking right through all of the dressings that were applied. I cannot imagine that this would last 7 days. Even if we change the cast twice a week I would be concerned. We worked on this patient  through the late part of 2022 in the early 2023 for this same wound. We attempted a cast at that time I think even a cast with a wound VAC and for 1 reason or another we could never make this progress towards closure. Fortunately his recent MRI did not show osteomyelitis. 10/26; patient presents for follow-up. He has been using silver alginate to the wound bed. He has no issues or complaints today. 11/3; wound edges are macerated secondary to copious drainage. PCR culture came back showing MRSA this has been sent to [facility] but we do not yet have that available.. Nursing reports still copious drainage 11/17; patient presents for follow-up. He states he received Keystone antibiotic ointment last week and has been using this With silver alginate. He reports improvement in drainage. He has no issues or complaints today. 12/1; patient presents for follow-up. He has been using Keystone antibiotic ointment with silver alginate. He reports continued improvement in drainage. We discussed the total contact cast today. For now he would like to hold off until later in the month. 12/14; patient presents for follow-up. He has been using Keystone antibiotic ointment with blast X and silver alginate. He states he would like to defer the total contact cast until the first of the year. He has been using his Psychologist, forensic. He currently denies signs of infection. He reports low amount of drainage. 1/4; patient presents for follow-up. He has been using Keystone antibiotic ointment and silver alginate. He has been on his feet more lately and reports that  the drainage has increased again. His wound has declined in appearance. He currently denies signs of infection. He does not want to attempt the total contact cast. 1/15; patient has been using Keystone antibiotic and silver alginate on a left plantar foot wound that has been chronic. His MRI was last done in July 2023 that did not show evidence of osteomyelitis. His  arterial studies have been normal. We have previously treated him with hyperbaric oxygen for chronic refractory osteomyelitis. We have attempted total contact casting but ran into trouble with excessive drainage. He was treated with antibiotics in 2022 as directed by Dr. Algis Liming of infectious disease. He is using a surgical shoe for offloading. Likely has Charcot foot in this area 1/22; patient presents for follow-up. He continues to use [facility] antibiotic ointment and silver alginate to the left plantar foot wound. He has no issues or complaints today. Electronic Signature(s) Signed: 11/09/2022 10:49:17 AM By: [name] DO Entered By: [name] on 11/08/2022 16:11:25 -------------------------------------------------------------------------------- Physical Exam Details Patient Name: Date of Service: TAJI, BARRETTO 11/08/2022 3:15 PM Medical Record Number: [id number] Patient Account Number: [id number] Date of Birth/Sex: Treating RN: [birth year] (62 y.o. M) Primary Care Provider: [name] Other Clinician: Referring Provider: Treating Provider/Extender: [name] in Treatment: 26 Constitutional respirations regular, non-labored and within target range for patient.. Cardiovascular 2+ dorsalis pedis/posterior tibialis pulses. HERO, KULISH ([id number]) [id number] Page 5 of 13 Psychiatric pleasant and cooperative. Notes T the left plantar midfoot there is an open wound with boggy tissue over the surface and a thick callus to the edges circumferentially. Undermining from the o 118 o'clock position. Increased depth to the center of the wound although does not probe to bone. No signs of infection Electronic Signature(s) Signed: 11/09/2022 10:49:17 AM By: [name] DO Entered By: [name] on 11/08/2022 16:12:39 -------------------------------------------------------------------------------- Physician Orders  Details Patient Name: Date of Service: NIKLAS, CHRETIEN IG 11/08/2022 3:15 PM Medical Record Number: [id number] Patient Account Number: [id number] Date of Birth/Sex: Treating RN: December 15, 1960 (62 y.o. [name] Primary Care Provider: [name] Other Clinician: Referring Provider: Treating Provider/Extender: [name] in Treatment: 34 Verbal / Phone Orders: No Diagnosis Coding ICD-10 Coding Code Description E11.621 Type 2 diabetes mellitus with foot ulcer L97.522 Non-pressure chronic ulcer of other part of left foot with fat layer exposed M86.372 Chronic multifocal osteomyelitis, left ankle and foot M14.672 Charcot's joint, left ankle and foot Follow-up Appointments ppointment in 2 weeks. - Dr. Mikey Bussing and [name] 11/15/2022 315pm room 8-CANCEL Return A Follow up with Podiatry- call their office. Will add you on our schedule for two weeks if podiatry gets you in sooner cancel your appt with wound center. Dr. Mikey Bussing Monday 11/22/2022 3pm Other: - continue topical antibiotics Keystone Antibiotics. Follow up podiatry. Anesthetic (In clinic) Topical Lidocaine 4% applied to wound bed Cellular or Tissue Based Products Cellular or Tissue Based Product Type: - Run IVR for Grafix=20% Copay Edema Control - Lymphedema / SCD / Other Avoid standing for long periods of time. Exercise regularly Moisturize legs daily. Compression stocking or Garment 30-40 mm/Hg pressure to: - wear the Juxtalite HD to right and left leg. apply in the morning and remove at night. Off-Loading Other: - HOld the Defender Boot: minimal weight bearing left foot use wheelchair for mobility. Peg assist with surgical shoe for offloading for now. Additional Orders / Instructions Follow Nutritious Diet Wound Treatment Wound #5 - Foot Wound Laterality: Plantar, Left Cleanser: Wound Cleanser (Generic) 1 x Per Day/30  Days Discharge Instructions: Cleanse the wound with wound cleanser prior to  applying a clean dressing using gauze sponges, not tissue or cotton balls. Topical: topical antibiotics 1 x Per Day/30 Days Discharge Instructions: apply directly to wound bed. Prim Dressing: KerraCel Ag Gelling Fiber Dressing, 4x5 in (silver alginate) (Generic) 1 x Per Day/30 Days ary Discharge Instructions: Apply silver alginate over the Blastx and topical antibiotics. AIDIN, DOANE ([id number]) [id number] Page 6 of 13 Secondary Dressing: ABD Pad, 5x9 (Generic) 1 x Per Day/30 Days Discharge Instructions: Apply over primary dressing as directed. Secondary Dressing: Drawtex 4x4 in 1 x Per Day/30 Days Discharge Instructions: cut to into wound bed over the alginate. Secured With: [employer], 4.5x3.1 (in/yd) (Generic) 1 x Per Day/30 Days Discharge Instructions: Secure with Kerlix as directed. Secured With: 75M Medipore H Soft Cloth Surgical T ape, 4 x 10 (in/yd) (Generic) 1 x Per Day/30 Days Discharge Instructions: Secure with tape as directed. Electronic Signature(s) Signed: 11/09/2022 10:49:17 AM By: [name] DO Entered By: [name] on 11/08/2022 16:12:46 -------------------------------------------------------------------------------- Problem List Details Patient Name: Date of Service: TRISTON, SKARE IG 11/08/2022 3:15 PM Medical Record Number: [id number] Patient Account Number: [id number] Date of Birth/Sex: Treating RN: 1961-07-17 (62 y.o. [name] Primary Care Provider: [name] Other Clinician: Referring Provider: Treating Provider/Extender: [name] in Treatment: 42 Active Problems ICD-10 Encounter Code Description Active Date MDM Diagnosis E11.621 Type 2 diabetes mellitus with foot ulcer 05/04/2022 No Yes L97.522 Non-pressure chronic ulcer of other part of left foot with fat layer exposed 05/04/2022 No Yes M86.372 Chronic multifocal osteomyelitis, left ankle and foot 05/04/2022 No Yes M14.672  Charcot's joint, left ankle and foot 05/04/2022 No Yes Inactive Problems Resolved Problems Electronic Signature(s) Signed: 11/09/2022 10:49:17 AM By: [name] DO Entered By: [name] on 11/08/2022 16:10:34 Kestenbaum, Grove ([id number]) [id number] Page 7 of 13 -------------------------------------------------------------------------------- Progress Note Details Patient Name: Date of Service: ZAEEM, KANDEL 11/08/2022 3:15 PM Medical Record Number: [id number] Patient Account Number: [id number] Date of Birth/Sex: Treating RN: 19-Nov-1960 (62 y.o. M) Primary Care Provider: [name] Other Clinician: Referring Provider: Treating Provider/Extender: [name] in Treatment: 86 Subjective Chief Complaint Information obtained from Patient Left foot ulcer History of Present Illness (HPI) 10/31/2019 upon evaluation today patient appears to be doing somewhat poorly in regard to his bilateral plantar feet. He has wounds that he tells me have been present since 2012 intermittently off and on. Most recently this has been open for at least the past 6 months to a year. He has been trying to treat this in different ways using Santyl along with various other dressings including Medihoney and even at one point Xeroform. Nothing really has seem to get this completely closed. He was recently in the hospital for cellulitis of his leg subsequently he did have x-rays as well as MRIs that showed negative for any signs of osteomyelitis in regard to the wounds on his feet. Fortunately there is no signs of systemic infection at this time. No fevers, chills, nausea, vomiting, or diarrhea. Patient has previously used Darco offloading shoes as far as frontal floaters as well as postop surgical shoes. He has never been in a total contact cast that may be something we need to strongly consider here. Patient's most recent hemoglobin A1c 1 month ago was 5.3  seems to be very well controlled which is great. Subsequently he has seen vascular as well as podiatry. His ABIs are 1.07 on the left and 1.14  on the right he seems to be doing well he does have chronic venous stasis. 11/07/2019 upon evaluation today patient appears to be doing well with regard to his wounds all things considered. I do not see any severe worsening he still has some callus buildup on the right more than the left he notes that he has been probably more active than he should as far as walking is concerned is just very hard to not be active. He knows he needs to be more careful in this regard however. He is willing to give the cast a try at this point although he notes that he is a little nervous about this just with regard to balance although he will be very careful and obviously if he has any trouble he knows to contact the office and let me know. 1/22; patient is in for his obligatory first total contact cast change. Our intake nurse reported a very large amount of drainage which is spelled out over to the surrounding skin. Has bilateral diabetic foot wounds. He has Charcot feet. We have been using silver alginate on his wounds. 11/14/2019 on evaluation today patient is actually seeming to make good improvement in regard to his bilateral plantar foot wounds. We have been using a cast on the left side and on the right side he has been using dressings he is changing up his own accord. With that being said he tells me that he is also not walking as much just due to how unsteady he feels. He takes it easy when he does have to walk and when he does not have to walk he is resting. This is probably help in his right foot as well has the left foot which is actually measuring better. In fact both are measuring better. Overall I am very pleased with how things seem to be progressing. The patient does have some odor on the left foot this does have me concerned about the possibility of infection, and  actually probably go ahead and put him on antibiotics today as well as utilizing a continuation of the cast on the left foot I think that will be fine we probably just need to bring him in sooner to change this not last a whole week. 1/29; we brought the patient back today for a total contact cast change on the left out of concern for excessive drainage. We are using drawtex over the wound as the primary dressing 11/21/2019 on evaluation today patient appears to be doing well with regard to his left plantar foot. In fact both foot ulcers actually seem to be doing pretty well. Nonetheless he is having a lot of drainage on the left at this time and again we did obtain a wound culture did show positive for Staph aureus that was reviewed by myself today as well. Nonetheless he is on Bactrim which was shown to be sensitive that should be helping in this regard. Fortunately there is no signs of infection systemically at this point. 2/5; back in clinic today for a total contact cast change apparently secondary to very significant drainage. Still using drawtex 11/28/2019 upon evaluation today patient appears to be doing well with regard to his wounds. The right foot is doing okay as measured about the same in my opinion. The left foot is actually showing signs of significant improvement is measuring smaller there is a lot of hyper granulation likely due to the continued drainage at this point. We did obtain approval for a snap VAC I  think that is good to be appropriate for him and will likely help this tremendously underneath the cast. He is definitely in agreement with proceeding with such. 2/12; patient came in today after his snap VAC lost suction. Brought in to see one of our nurses. The dressing was replaced and then we put the cast back on and rehooked up the snap VAC. Apparently his wound looked very good per our intake nurse. 2/15; again we replaced the cast on Friday. By Saturday the snap VAC and light  suction. He called this morning he comes in acutely. The wounds look fine however the VAC is not functioning. We replaced the cast using silver alginate as the primary dressing backed with Kerramax. The snap VAC was not replaced 12/05/2019 upon evaluation today patient appears to be doing better in regard to his left plantar foot ulcer. Fortunately there is no signs of active infection at this time. Unfortunately he is continuing to have issues with the right foot he is really not making any progress here things seem to be somewhat stagnant to be honest. The depth has increased but that is due to me having debrided the wound in the past based on what I am seeing. 12/12/2019 on evaluation today patient appears to be doing more poorly in regard to the left lower extremity. He has some erythema spreading up the side of his foot I am concerned about infection again at this point. Unfortunately he has been seeing improvement with a total contact cast but I do not think we should put that on today. On his right plantar foot he continues to have significant drainage this is actually measuring deeper I really do not feel like you are making any progress whatsoever. I have prescribed Granix for him unfortunately his insurance apparently was going to cost him a $500 co-pay. 12/19/2019 upon evaluation today patient actually appears to be doing better in regard to both wounds. With that being said he actually did get the reGranix which he had to pay $500 for. With that being said it does look like that he is actually made some improvement based on what I am seeing at this point with the reGranix. Obviously if he is going to continue this we are going to do something about trying to get him some help in covering the cost. 12/26/2019 on evaluation today patient appears to be doing really much better even compared to last week. Overall the wound seems to be much better even compared to last week and last week was better  than the week before. Since has been using the reGranix his symptoms have improved significantly. With that being said the issue right now is simply that this is a very expensive medication for him the first dose cost him $500. Upon inspection patient's wound bed actually is however dramatically improved compared to before he started this 2 weeks ago. 01/02/2020 upon evaluation today patient appears to actually be doing well. He still had a little bit of reGranix left that has been using in small amounts he just been applying it every other day instead of every day in order to make it go longer. Overall we are still seeing excellent improvement he is measuring smaller looking better healthier tissue and everything seems to be pointing to this headed in the right direction. Fortunately there is no evidence of infection either which is also excellent news. He does have his MRI coming up within the next week. 01/09/2020 upon evaluation today patient appears to  be doing a little worse today compared to previous week's evaluation. He is actually been out of the reGranix at this point. He has been trying to make the stretch out so he has been changing the dressings on a regular set schedule like he was previous. I think this has made a difference. Fortunately there is no signs of active infection at this time. No fevers, chills, nausea, vomiting, or diarrhea. 01/16/2020 upon evaluation today patient appears to be doing well with regard to his left plantar foot ulcer. The right plantar foot still shows some significant depth at this point. Fortunately there is no signs of active infection at this time. BASSAM, DRESCH ([id number]) [id number] Page 8 of 13 01/30/2020 upon evaluation today patient appears to be doing about the same in regard to his right plantar foot ulcer there is still some depth here and we had to wait till he actually switched over to his new insurance to get approval  for the MRI under his new insurance plan. With that being said he now has switched as of April 1. Fortunately there is no signs of active infection at this time. Overall in regard to his left foot ulcer this seems to be doing much better and I am actually very pleased with how things are going. With that being said it is not quite as much progress as we were seeing with the reGranix but at the same time he has had trouble getting this apparently there is been some hindrance here. I Georgina Peer try to actually send this to [facility] that was recommended by the drug rep to me. 02/13/20 upon evaluation today patient appears to be doing better in regard to his left foot ulcer this is great news. Unfortunately the right foot ulcer is not really significantly better at this time. There is no signs of active infection systemically though he did have his MRI which showed unfortunately he does have infection noted including an abscess in the foot. There is also marrow changes noted which are consistent with osteomyelitis based on the radiology review and interpretation. Unfortunately considering that the wound is not really making the progress that we will he would like to be seen I think that this is an indication that he may need some further referral both infectious disease as well as potentially to podiatrist to see if there is anything that can be done to help with the situation that were dealing with here. The left foot again is doing great. READMISSION 06/04/2021 This is a 62 year old man who was in the clinic in 2021 followed by Jeri Cos for areas on the right and left foot. He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

## 2022-11-10 NOTE — Progress Notes (Addendum)
[name] ([id number]) [id number] Page 1 of 8 Visit Report for 11/08/2022 Arrival Information Details Patient Name: Date of Service: [name], [name] 11/08/2022 3:15 PM Medical Record Number: [id number] Patient Account Number: [id number] Date of Birth/Sex: Treating RN: 1961/03/01 (62 y.o. M) Primary Care Wiley Flicker: Billey Gosling Other Clinician: Referring Leslee Haueter: Treating Daneisha Surges/Extender: Mickle Asper in Treatment: 26 Visit Information History Since Last Visit Added or deleted any medications: No Patient Arrived: Ambulatory Any new allergies or adverse reactions: No Arrival Time: 15:34 Had a fall or experienced change in No Accompanied By: self activities of daily living that may affect Transfer Assistance: None risk of falls: Patient Identification Verified: Yes Signs or symptoms of abuse/neglect since last visito No Secondary Verification Process Completed: Yes Hospitalized since last visit: No Patient Requires Transmission-Based Precautions: No Implantable device outside of the clinic excluding No Patient Has Alerts: No cellular tissue based products placed in the center since last visit: Has Dressing in Place as Prescribed: Yes Pain Present Now: No Electronic Signature(s) Signed: 11/08/2022 4:49:41 PM By: Erenest Blank Entered By: Erenest Blank on 11/08/2022 15:34:49 -------------------------------------------------------------------------------- Clinic Level of Care Assessment Details Patient Name: Date of Service: [name] 11/08/2022 3:15 PM Medical Record Number: [id number] Patient Account Number: [id number] Date of Birth/Sex: Treating RN: 10-26-1960 (62 y.o. [name] Primary Care Nasir Bright: Billey Gosling Other Clinician: Referring Delesha Pohlman: Treating Ivo Moga/Extender: Mickle Asper in Treatment: 26 Clinic Level of Care Assessment Items TOOL 4 Quantity Score X- 1 0 Use when only  an EandM is performed on FOLLOW-UP visit ASSESSMENTS - Nursing Assessment / Reassessment X- 1 10 Reassessment of Co-morbidities (includes updates in patient status) X- 1 5 Reassessment of Adherence to Treatment Plan ASSESSMENTS - Wound and Skin A ssessment / Reassessment X - Simple Wound Assessment / Reassessment - one wound 1 5 []$  - 0 Complex Wound Assessment / Reassessment - multiple wounds []$  - 0 Dermatologic / Skin Assessment (not related to wound area) ASSESSMENTS - Focused Assessment []$  - 0 Circumferential Edema Measurements - multi extremities []$  - 0 Nutritional Assessment / Counseling / Intervention SABAS, SABET ([id number]) [id number] Page 2 of 8 []$  - 0 Lower Extremity Assessment (monofilament, tuning fork, pulses) []$  - 0 Peripheral Arterial Disease Assessment (using hand held doppler) ASSESSMENTS - Ostomy and/or Continence Assessment and Care []$  - 0 Incontinence Assessment and Management []$  - 0 Ostomy Care Assessment and Management (repouching, etc.) PROCESS - Coordination of Care X - Simple Patient / Family Education for ongoing care 1 15 []$  - 0 Complex (extensive) Patient / Family Education for ongoing care X- 1 10 Staff obtains Programmer, systems, Records, T Results / Process Orders est []$  - 0 Staff telephones HHA, Nursing Homes / Clarify orders / etc []$  - 0 Routine Transfer to another Facility (non-emergent condition) []$  - 0 Routine Hospital Admission (non-emergent condition) []$  - 0 New Admissions / Biomedical engineer / Ordering NPWT Apligraf, etc. , []$  - 0 Emergency Hospital Admission (emergent condition) X- 1 10 Simple Discharge Coordination []$  - 0 Complex (extensive) Discharge Coordination PROCESS - Special Needs []$  - 0 Pediatric / Minor Patient Management []$  - 0 Isolation Patient Management []$  - 0 Hearing / Language / Visual special needs []$  - 0 Assessment of Community assistance (transportation, D/C planning, etc.) []$   - 0 Additional assistance / Altered mentation []$  - 0 Support Surface(s) Assessment (bed, cushion, seat, etc.) INTERVENTIONS - Wound Cleansing / Measurement X - Simple Wound Cleansing - one wound 1 5 []$  - 0  Complex Wound Cleansing - multiple wounds X- 1 5 Wound Imaging (photographs - any number of wounds) []$  - 0 Wound Tracing (instead of photographs) X- 1 5 Simple Wound Measurement - one wound []$  - 0 Complex Wound Measurement - multiple wounds INTERVENTIONS - Wound Dressings []$  - 0 Small Wound Dressing one or multiple wounds X- 1 15 Medium Wound Dressing one or multiple wounds []$  - 0 Large Wound Dressing one or multiple wounds []$  - 0 Application of Medications - topical []$  - 0 Application of Medications - injection INTERVENTIONS - Miscellaneous []$  - 0 External ear exam []$  - 0 Specimen Collection (cultures, biopsies, blood, body fluids, etc.) []$  - 0 Specimen(s) / Culture(s) sent or taken to Lab for analysis []$  - 0 Patient Transfer (multiple staff / Civil Service fast streamer / Similar devices) []$  - 0 Simple Staple / Suture removal (25 or less) []$  - 0 Complex Staple / Suture removal (26 or more) []$  - 0 Hypo / Hyperglycemic Management (close monitor of Blood Glucose) Luscher, Nickalaus ([id number]) [id number] Page 3 of 8 []$  - 0 Ankle / Brachial Index (ABI) - do not check if billed separately X- 1 5 Vital Signs Has the patient been seen at the hospital within the last three years: Yes Total Score: 90 Level Of Care: New/Established - Level 3 Electronic Signature(s) Signed: 11/09/2022 6:38:55 PM By: Deon Pilling RN, BSN Entered By: Deon Pilling on 11/08/2022 16:10:34 -------------------------------------------------------------------------------- Encounter Discharge Information Details Patient Name: Date of Service: [name] IG 11/08/2022 3:15 PM Medical Record Number: [id number] Patient Account Number: [id number] Date of Birth/Sex: Treating RN: [birth year]  (62 y.o. [name] Primary Care Sherri Mcarthy: Billey Gosling Other Clinician: Referring Byrd Terrero: Treating Rosemae Mcquown/Extender: Mickle Asper in Treatment: 73 Encounter Discharge Information Items Discharge Condition: Stable Ambulatory Status: Cane Discharge Destination: Home Transportation: Private Auto Accompanied By: self Schedule Follow-up Appointment: Yes Clinical Summary of Care: Electronic Signature(s) Signed: 11/09/2022 6:38:55 PM By: Deon Pilling RN, BSN Entered By: Deon Pilling on 11/08/2022 16:11:04 -------------------------------------------------------------------------------- Lower Extremity Assessment Details Patient Name: Date of Service: ARLY, SEGALLA IG 11/08/2022 3:15 PM Medical Record Number: [id number] Patient Account Number: [id number] Date of Birth/Sex: Treating RN: 1961-09-13 (62 y.o. [name] Primary Care Sundae Maners: Billey Gosling Other Clinician: Referring Sheena Donegan: Treating Mahdiya Mossberg/Extender: Lucile Crater Weeks in Treatment: 26 Edema Assessment Assessed: [Left: Yes] [Right: No] Edema: [Left: N] [Right: o] Calf Left: Right: Point of Measurement: 37 cm From Medial Instep 41 cm Ankle Left: Right: Point of Measurement: 11 cm From Medial Instep 23.5 cm Electronic Signature(s) Signed: 11/09/2022 6:38:55 PM By: Deon Pilling RN, BSN [place], [name] ([id number]) [id number] Page 4 of 8 Signed: 11/09/2022 6:38:55 PM By: Deon Pilling RN, BSN Entered By: Deon Pilling on 11/08/2022 16:05:18 -------------------------------------------------------------------------------- Multi Wound Chart Details Patient Name: Date of Service: CHORD, MCCURTY IG 11/08/2022 3:15 PM Medical Record Number: [id number] Patient Account Number: [id number] Date of Birth/Sex: Treating RN: May 29, 1961 (62 y.o. M) Primary Care Mckinze Poirier: Billey Gosling Other Clinician: Referring Shatina Streets: Treating Haydn Cush/Extender: Mickle Asper in Treatment: 26 Vital Signs Height(in): 73 Pulse(bpm): 37 Weight(lbs): 237 Blood Pressure(mmHg): 125/81 Body Mass Index(BMI): 31.3 Temperature(F): 98.2 Respiratory Rate(breaths/min): 18 [5:Photos:] [N/A:N/A] Left, Plantar Foot N/A N/A Wound Location: Gradually Appeared N/A N/A Wounding Event: Diabetic Wound/Ulcer of the Lower N/A N/A Primary Etiology: Extremity Anemia, Sleep Apnea, Congestive N/A N/A Comorbid History: Heart Failure, Hypertension, Peripheral Venous Disease, Cirrhosis , Type II Diabetes, Osteoarthritis, Osteomyelitis, Neuropathy 10/19/2019 N/A N/A Date Acquired: 14 N/A N/A Weeks of  Treatment: Open N/A N/A Wound Status: No N/A N/A Wound Recurrence: Yes N/A N/A Pending A mputation on Presentation: 3.5x3x1 N/A N/A Measurements L x W x D (cm) 8.247 N/A N/A A (cm) : rea 8.247 N/A N/A Volume (cm) : -98.50% N/A N/A % Reduction in A rea: -1887.20% N/A N/A % Reduction in Volume: Grade 2 N/A N/A Classification: Medium N/A N/A Exudate A mount: Serosanguineous N/A N/A Exudate Type: red, brown N/A N/A Exudate Color: Well defined, not attached N/A N/A Wound Margin: Large (67-100%) N/A N/A Granulation A mount: Red, Pink, Hyper-granulation N/A N/A Granulation Quality: None Present (0%) N/A N/A Necrotic A mount: Fat Layer (Subcutaneous Tissue): Yes N/A N/A Exposed Structures: Fascia: No Tendon: No Muscle: No Joint: No Bone: No None N/A N/A Epithelialization: Callus: Yes N/A N/A Periwound Skin Texture: Excoriation: No Induration: No Crepitus: No Rash: No Scarring: No Maceration: No N/A N/A Periwound Skin Moisture: Dry/Scaly: No Kercher, [place] ([id number]) [id number] Page 5 of 8 Atrophie Blanche: No N/A N/A Periwound Skin Color: Cyanosis: No Ecchymosis: No Erythema: No Hemosiderin Staining: No Mottled: No Pallor: No Rubor: No No Abnormality N/A N/A Temperature: Treatment  Notes Electronic Signature(s) Signed: 11/09/2022 10:49:17 AM By: Kalman Shan DO Entered By: Kalman Shan on 11/08/2022 16:10:42 -------------------------------------------------------------------------------- Multi-Disciplinary Care Plan Details Patient Name: Date of Service: RODRICO, ASPLUND IG 11/08/2022 3:15 PM Medical Record Number: [id number] Patient Account Number: [id number] Date of Birth/Sex: Treating RN: January 25, 1961 (62 y.o. [name] Primary Care Tellis Spivak: Billey Gosling Other Clinician: Referring Eutimio Gharibian: Treating Jerusalem Wert/Extender: Mickle Asper in Treatment: 22 Active Inactive Electronic Signature(s) Signed: 12/05/2022 9:50:00 AM By: Deon Pilling RN, BSN Previous Signature: 11/09/2022 6:38:55 PM Version By: Deon Pilling RN, BSN Entered By: Deon Pilling on 12/05/2022 09:49:59 -------------------------------------------------------------------------------- Pain Assessment Details Patient Name: Date of Service: JERMINE, EADIE IG 11/08/2022 3:15 PM Medical Record Number: [id number] Patient Account Number: [id number] Date of Birth/Sex: Treating RN: 12/14/1960 (62 y.o. [name] Primary Care Dontay Harm: Billey Gosling Other Clinician: Referring Chayla Shands: Treating Derius Ghosh/Extender: Mickle Asper in Treatment: 26 Active Problems Location of Pain Severity and Description of Pain Patient Has Paino No Site Locations Rate the pain. VIHAN, OSTER ([id number]) [id number] Page 6 of 8 Rate the pain. Current Pain Level: 0 Pain Management and Medication Current Pain Management: Medication: No Cold Application: No Rest: No Massage: No Activity: No T.E.N.S.: No Heat Application: No Leg drop or elevation: No Is the Current Pain Management Adequate: Adequate How does your wound impact your activities of daily livingo Sleep: No Bathing: No Appetite: No Relationship With Others: No Bladder  Continence: No Emotions: No Bowel Continence: No Work: No Toileting: No Drive: No Dressing: No Hobbies: No Engineer, maintenance) Signed: 11/09/2022 6:38:55 PM By: Deon Pilling RN, BSN Entered By: Deon Pilling on 11/08/2022 16:05:14 -------------------------------------------------------------------------------- Patient/Caregiver Education Details Patient Name: Date of Service: Emelia Salisbury 1/22/2024andnbsp3:15 PM Medical Record Number: [id number] Patient Account Number: [id number] Date of Birth/Gender: Treating RN: 02-16-1961 (62 y.o. [name] Primary Care Physician: Billey Gosling Other Clinician: Referring Physician: Treating Physician/Extender: Mickle Asper in Treatment: 61 Education Assessment Education Provided To: Patient Education Topics Provided Wound/Skin Impairment: Handouts: Caring for Your Ulcer Methods: Explain/Verbal Responses: Reinforcements needed Electronic Signature(s) Signed: 11/09/2022 6:38:55 PM By: Deon Pilling RN, BSN Entered By: Deon Pilling on 11/08/2022 16:06:33 [place], [name] ([id number]) [id number] Page 7 of 8 -------------------------------------------------------------------------------- Wound Assessment Details Patient Name: Date of Service: JACEN, MAHANNAH 11/08/2022 3:15 PM Medical Record Number: [id number] Patient Account Number: [id number]  Date of Birth/Sex: Treating RN: Jan 09, 1961 (62 y.o. M) Primary Care Cortnee Steinmiller: Billey Gosling Other Clinician: Referring Quest Tavenner: Treating Kenedi Cilia/Extender: Mickle Asper in Treatment: 26 Wound Status Wound Number: 5 Primary Diabetic Wound/Ulcer of the Lower Extremity Etiology: Wound Location: Left, Plantar Foot Wound Open Wounding Event: Gradually Appeared Status: Date Acquired: 10/19/2019 Comorbid Anemia, Sleep Apnea, Congestive Heart Failure, Hypertension, Weeks Of Treatment: 26 History: Peripheral Venous Disease,  Cirrhosis , Type II Diabetes, Clustered Wound: No Osteoarthritis, Osteomyelitis, Neuropathy Pending Amputation On Presentation Photos Wound Measurements Length: (cm) 3.5 Width: (cm) 3 Depth: (cm) 1 Area: (cm) 8.247 Volume: (cm) 8.247 % Reduction in Area: -98.5% % Reduction in Volume: -1887.2% Epithelialization: None Tunneling: No Undermining: No Wound Description Classification: Grade 2 Wound Margin: Well defined, not attached Exudate Amount: Medium Exudate Type: Serosanguineous Exudate Color: red, brown Foul Odor After Cleansing: No Slough/Fibrino No Wound Bed Granulation Amount: Large (67-100%) Exposed Structure Granulation Quality: Red, Pink, Hyper-granulation Fascia Exposed: No Necrotic Amount: None Present (0%) Fat Layer (Subcutaneous Tissue) Exposed: Yes Tendon Exposed: No Muscle Exposed: No Joint Exposed: No Bone Exposed: No Periwound Skin Texture Texture Color No Abnormalities Noted: No No Abnormalities Noted: No Callus: Yes Atrophie Blanche: No Crepitus: No Cyanosis: No Excoriation: No Ecchymosis: No Induration: No Erythema: No Rash: No Hemosiderin Staining: No Scarring: No Mottled: No Pallor: No Moisture Rubor: No No Abnormalities Noted: No Dry / Scaly: No Temperature / Pain Boehler, [place] ([id number]) [id number] Page 8 of 8 Maceration: No Temperature: No Abnormality Treatment Notes Wound #5 (Foot) Wound Laterality: Plantar, Left Cleanser Wound Cleanser Discharge Instruction: Cleanse the wound with wound cleanser prior to applying a clean dressing using gauze sponges, not tissue or cotton balls. Peri-Wound Care Topical topical antibiotics Discharge Instruction: apply directly to wound bed. Primary Dressing KerraCel Ag Gelling Fiber Dressing, 4x5 in (silver alginate) Discharge Instruction: Apply silver alginate over the Blastx and topical antibiotics. Secondary Dressing ABD Pad, 5x9 Discharge Instruction: Apply  over primary dressing as directed. Drawtex 4x4 in Discharge Instruction: cut to into wound bed over the alginate. Secured With [employer], 4.5x3.1 (in/yd) Discharge Instruction: Secure with Kerlix as directed. 72M Medipore H Soft Cloth Surgical T ape, 4 x 10 (in/yd) Discharge Instruction: Secure with tape as directed. Compression Wrap Compression Stockings Add-Ons Electronic Signature(s) Signed: 11/09/2022 6:38:55 PM By: Deon Pilling RN, BSN Previous Signature: 11/08/2022 3:48:42 PM Version By: Worthy Rancher Entered By: Deon Pilling on 11/08/2022 16:05:41 -------------------------------------------------------------------------------- Vitals Details Patient Name: Date of Service: [name] IG 11/08/2022 3:15 PM Medical Record Number: [id number] Patient Account Number: [id number] Date of Birth/Sex: Treating RN: 1961-04-22 (62 y.o. M) Primary Care Glanda Spanbauer: Billey Gosling Other Clinician: Referring Court Gracia: Treating Roise Emert/Extender: Mickle Asper in Treatment: 26 Vital Signs Time Taken: 15:34 Temperature (F): 98.2 Height (in): 73 Pulse (bpm): 96 Weight (lbs): 237 Respiratory Rate (breaths/min): 18 Body Mass Index (BMI): 31.3 Blood Pressure (mmHg): 125/81 Reference Range: 80 - 120 mg / dl Electronic Signature(s) Signed: 11/08/2022 4:49:41 PM By: Erenest Blank Entered By: Erenest Blank on 11/08/2022 15:35:07

## 2022-11-10 NOTE — Telephone Encounter (Signed)
Mychart message sent by pt: [name] [facility] (supporting Tammy S Parrett, NP)2 hours ago (1:00 PM)      I recently saw an ad for a cpap alternative. I normally would just pass up on unsolicited ads but this sounded like it may be legit. It's a mouthpiece that aligns your lower jaw so there is a clear airway while you sleep. its called ZQuiet. I was wondering if you heard of it and if so, what your opinion was? If you haven't heard of it would you be able to look into it? I can't seem to get use to the cpap and I am seeking an alternative. [name]     Tammy, please advise.

## 2022-11-12 NOTE — Telephone Encounter (Signed)
I looked this piece up it looks like a mouthguard for snoring.  Typically these are not effective in severe sleep apnea. Typically need CPAP for more severe sleep apnea.  We could also discuss surgical procedures called inspire that might be an option if he absolutely cannot tolerate CPAP.  I suggest an office visit to discuss options if he absolutely cannot tolerate CPAP

## 2022-11-14 ENCOUNTER — Other Ambulatory Visit: Payer: Self-pay | Admitting: Internal Medicine

## 2022-11-15 ENCOUNTER — Ambulatory Visit (HOSPITAL_BASED_OUTPATIENT_CLINIC_OR_DEPARTMENT_OTHER): Payer: Medicare Other | Admitting: Internal Medicine

## 2022-11-22 ENCOUNTER — Encounter (HOSPITAL_BASED_OUTPATIENT_CLINIC_OR_DEPARTMENT_OTHER): Payer: Medicare Other | Admitting: Internal Medicine

## 2022-11-23 ENCOUNTER — Ambulatory Visit (INDEPENDENT_AMBULATORY_CARE_PROVIDER_SITE_OTHER): Payer: Medicare Other

## 2022-11-23 ENCOUNTER — Ambulatory Visit (INDEPENDENT_AMBULATORY_CARE_PROVIDER_SITE_OTHER): Payer: Medicare Other | Admitting: Podiatry

## 2022-11-23 DIAGNOSIS — M14672 Charcot's joint, left ankle and foot: Secondary | ICD-10-CM | POA: Diagnosis not present

## 2022-11-23 DIAGNOSIS — L97522 Non-pressure chronic ulcer of other part of left foot with fat layer exposed: Secondary | ICD-10-CM
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
## 2022-12-07 ENCOUNTER — Encounter: Payer: Self-pay | Admitting: Podiatry

## 2022-12-10 ENCOUNTER — Other Ambulatory Visit: Payer: Self-pay | Admitting: Internal Medicine

## 2022-12-10 ENCOUNTER — Other Ambulatory Visit: Payer: Self-pay

## 2022-12-18 ENCOUNTER — Other Ambulatory Visit: Payer: Self-pay | Admitting: Internal Medicine

## 2022-12-20 ENCOUNTER — Other Ambulatory Visit: Payer: Self-pay

## 2023-01-02 ENCOUNTER — Other Ambulatory Visit: Payer: Self-pay | Admitting: Internal Medicine

## 2023-01-09 ENCOUNTER — Other Ambulatory Visit: Payer: Self-pay | Admitting: Internal Medicine

## 2023-01-20 ENCOUNTER — Encounter: Payer: Self-pay | Admitting: Podiatry

## 2023-01-23 ENCOUNTER — Other Ambulatory Visit: Payer: Self-pay | Admitting: Internal Medicine

## 2023-01-26 ENCOUNTER — Ambulatory Visit (INDEPENDENT_AMBULATORY_CARE_PROVIDER_SITE_OTHER): Payer: Medicare Other | Admitting: Podiatry

## 2023-01-26 DIAGNOSIS — L97522 Non-pressure chronic ulcer of other part of left foot with fat layer exposed: Secondary | ICD-10-CM | POA: Diagnosis not present

## 2023-01-29 ENCOUNTER — Other Ambulatory Visit: Payer: Self-pay | Admitting: Internal Medicine

## 2023-01-30 ENCOUNTER — Encounter: Payer: Self-pay | Admitting: Podiatry

## 2023-01-30 NOTE — Progress Notes (Signed)
  Subjective:  Patient ID: [name], male    DOB: [birth year],  MRN: [id number]  Chief Complaint  Patient presents with   Foot Ulcer    62 y.o. male presents with the above complaint. History confirmed with patient.  He returns for follow-up on ongoing wound care with Dr. Allena Katz.  Wound has not improved much.  They are looking into referral to surgery for consultation for a free tissue flap  Objective:  Physical Exam: warm, good capillary refill, normal DP and PT pulses, and abnormal sensory exam with loss of protective sensation, large plantar ulceration with exposed subcutaneous tissues measures 5.5 cm x 4.5 cm x 1.0 cm.  No signs of infection serous drainage, no cellulitis, large amount of surrounding hyperkeratosis, granular wound bed.  Assessment:   1. Ulcer of left foot with fat layer exposed      Plan:  Patient was evaluated and treated and all questions answered.  Ulcer left foot -Will discuss with Dr. Allena Katz about his referral to plastic surgery -Debridement as below. -Dressed with Kandis Mannan, DSD. -Continue home dressing changes daily     Procedure: Excisional Debridement of Wound Rationale: Removal of non-viable soft tissue from the wound to promote healing.  Anesthesia: none Post-Debridement Wound Measurements: Postdebridement measurements are noted above.   Type of Debridement: Sharp Excisional Tissue Removed: Non-viable soft tissue Depth of Debridement: subcutaneous tissue. Technique: Sharp excisional debridement to bleeding, viable wound base.  Dressing: Dry, sterile, compression dressing. Disposition: Patient tolerated procedure well.       Return in about 2 weeks (around 02/09/2023) for wound care.

## 2023-02-03 ENCOUNTER — Encounter: Payer: Self-pay | Admitting: Internal Medicine

## 2023-02-08 ENCOUNTER — Ambulatory Visit (INDEPENDENT_AMBULATORY_CARE_PROVIDER_SITE_OTHER): Payer: Medicare Other | Admitting: Podiatry

## 2023-02-08 ENCOUNTER — Telehealth: Payer: Self-pay | Admitting: *Deleted

## 2023-02-08 DIAGNOSIS — L97522 Non-pressure chronic ulcer of other part of left foot with fat layer exposed: Secondary | ICD-10-CM

## 2023-02-08 DIAGNOSIS — M14672 Charcot's joint, left ankle and foot: Secondary | ICD-10-CM | POA: Diagnosis not present
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
## 2023-02-08 NOTE — Telephone Encounter (Signed)
Patient is wanting the name of the medical practice mentioned during visit today were he can get a flap or plastic surgery for his foot, please advise.

## 2023-02-11 ENCOUNTER — Inpatient Hospital Stay
Admission: RE | Admit: 2023-02-11 | Discharge: 2023-02-11 | Disposition: A | Discharge status: Home / Self Care | Payer: Self-pay | Source: Ambulatory Visit | Attending: Neurosurgery | Admitting: Neurosurgery

## 2023-02-11 ENCOUNTER — Other Ambulatory Visit: Payer: Self-pay

## 2023-02-11 DIAGNOSIS — Z049 Encounter for examination and observation for unspecified reason: Secondary | ICD-10-CM

## 2023-02-16 ENCOUNTER — Other Ambulatory Visit: Payer: Self-pay | Admitting: Internal Medicine
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
## 2023-02-17 ENCOUNTER — Encounter: Payer: Self-pay | Admitting: Neurosurgery

## 2023-02-17 ENCOUNTER — Ambulatory Visit (INDEPENDENT_AMBULATORY_CARE_PROVIDER_SITE_OTHER): Payer: Medicare Other | Admitting: Neurosurgery

## 2023-02-17 VITALS — BP 142/82 | Ht 72.0 in | Wt 278.4 lb

## 2023-02-17 DIAGNOSIS — M48061 Spinal stenosis, lumbar region without neurogenic claudication: Secondary | ICD-10-CM

## 2023-02-17 DIAGNOSIS — S32031A Stable burst fracture of third lumbar vertebra, initial encounter for closed fracture: Secondary | ICD-10-CM

## 2023-03-07 ENCOUNTER — Other Ambulatory Visit: Payer: Self-pay

## 2023-03-07 DIAGNOSIS — S32031A Stable burst fracture of third lumbar vertebra, initial encounter for closed fracture: Secondary | ICD-10-CM

## 2023-03-07 DIAGNOSIS — M48061 Spinal stenosis, lumbar region without neurogenic claudication: Secondary | ICD-10-CM

## 2023-03-10 NOTE — Addendum Note (Signed)
Addended by: Sharlot Gowda on: 03/10/2023 05:09 PM   Modules accepted: Orders

## 2023-03-11 ENCOUNTER — Other Ambulatory Visit: Payer: Self-pay | Admitting: Internal Medicine

## 2023-03-14 ENCOUNTER — Telehealth: Payer: Self-pay | Admitting: Adult Health

## 2023-03-14 NOTE — Telephone Encounter (Signed)
Pt. Stated that he didn't want to f/u and have taken off recall

## 2023-03-17 ENCOUNTER — Other Ambulatory Visit: Payer: Self-pay | Admitting: Neurosurgery

## 2023-03-17 DIAGNOSIS — M48061 Spinal stenosis, lumbar region without neurogenic claudication: Secondary | ICD-10-CM

## 2023-03-17 DIAGNOSIS — S32031A Stable burst fracture of third lumbar vertebra, initial encounter for closed fracture: Secondary | ICD-10-CM

## 2023-03-18 ENCOUNTER — Encounter: Payer: Self-pay | Admitting: Internal Medicine

## 2023-03-22 ENCOUNTER — Other Ambulatory Visit: Payer: Medicare Other

## 2023-03-22 ENCOUNTER — Ambulatory Visit
Admission: RE | Admit: 2023-03-22 | Discharge: 2023-03-22 | Disposition: A | Discharge status: Home / Self Care | Payer: Medicare Other | Source: Ambulatory Visit | Attending: Neurosurgery | Admitting: Neurosurgery

## 2023-03-22 DIAGNOSIS — M48061 Spinal stenosis, lumbar region without neurogenic claudication: Secondary | ICD-10-CM

## 2023-03-22 DIAGNOSIS — S32031A Stable burst fracture of third lumbar vertebra, initial encounter for closed fracture: Secondary | ICD-10-CM

## 2023-03-22 MED ORDER — IOPAMIDOL (ISOVUE-M 200) INJECTION 41%
1.0000 mL | Freq: Once | INTRAMUSCULAR | Status: AC
  Administered 2023-03-22: 1 mL via EPIDURAL

## 2023-03-22 MED ORDER — METHYLPREDNISOLONE ACETATE 40 MG/ML INJ SUSP (RADIOLOG
80.0000 mg | Freq: Once | INTRAMUSCULAR | Status: AC
  Administered 2023-03-22: 80 mg via EPIDURAL

## 2023-03-22 NOTE — Discharge Instructions (Signed)
Post Procedure Spinal Discharge Instruction Sheet  You may resume a regular diet and any medications that you routinely take (including pain medications) unless otherwise noted by MD.  No driving day of procedure.  Light activity throughout the rest of the day.  Do not do any strenuous work, exercise, bending or lifting.  The day following the procedure, you can resume normal physical activity but you should refrain from exercising or physical therapy for at least three days thereafter.  You may apply ice to the injection site, 20 minutes on, 20 minutes off, as needed. Do not apply ice directly to skin.    Common Side Effects:  Headaches- take your usual medications as directed by your physician.  Increase your fluid intake.  Caffeinated beverages may be helpful.  Lie flat in bed until your headache resolves.  Restlessness or inability to sleep- you may have trouble sleeping for the next few days.  Ask your referring physician if you need any medication for sleep.  Facial flushing or redness- should subside within a few days.  Increased pain- a temporary increase in pain a day or two following your procedure is not unusual.  Take your pain medication as prescribed by your referring physician.  Leg cramps  Please contact our office at [phone] for the following symptoms: Fever greater than 100 degrees. Headaches unresolved with medication after 2-3 days. Increased swelling, pain, or redness at injection site.   Thank you for visiting [facility] today.   

## 2023-03-31 ENCOUNTER — Other Ambulatory Visit: Payer: Self-pay

## 2023-03-31 ENCOUNTER — Emergency Department (HOSPITAL_COMMUNITY): Payer: Medicare Other

## 2023-03-31 ENCOUNTER — Inpatient Hospital Stay (HOSPITAL_COMMUNITY): Payer: Medicare Other

## 2023-03-31 ENCOUNTER — Encounter (HOSPITAL_COMMUNITY): Payer: Self-pay

## 2023-03-31 ENCOUNTER — Ambulatory Visit (INDEPENDENT_AMBULATORY_CARE_PROVIDER_SITE_OTHER): Payer: Medicare Other | Admitting: Podiatry

## 2023-03-31 ENCOUNTER — Inpatient Hospital Stay (HOSPITAL_COMMUNITY)
Admission: EM | Admit: 2023-03-31 | Discharge: 2023-04-08 | DRG: 239 | Disposition: A | Discharge status: Rehabilitation Facility | Payer: Medicare Other | Source: Ambulatory Visit | Attending: Internal Medicine | Admitting: Internal Medicine

## 2023-03-31 DIAGNOSIS — Z6837 Body mass index (BMI) 37.0-37.9, adult: Secondary | ICD-10-CM

## 2023-03-31 DIAGNOSIS — E11621 Type 2 diabetes mellitus with foot ulcer: Secondary | ICD-10-CM | POA: Diagnosis present

## 2023-03-31 DIAGNOSIS — E43 Unspecified severe protein-calorie malnutrition: Secondary | ICD-10-CM | POA: Diagnosis present

## 2023-03-31 DIAGNOSIS — I2510 Atherosclerotic heart disease of native coronary artery without angina pectoris: Secondary | ICD-10-CM | POA: Diagnosis not present

## 2023-03-31 DIAGNOSIS — K5901 Slow transit constipation: Secondary | ICD-10-CM | POA: Diagnosis present

## 2023-03-31 DIAGNOSIS — E1142 Type 2 diabetes mellitus with diabetic polyneuropathy: Secondary | ICD-10-CM | POA: Diagnosis present

## 2023-03-31 DIAGNOSIS — E872 Acidosis, unspecified: Secondary | ICD-10-CM | POA: Diagnosis present

## 2023-03-31 DIAGNOSIS — E871 Hypo-osmolality and hyponatremia: Secondary | ICD-10-CM | POA: Diagnosis present

## 2023-03-31 DIAGNOSIS — K760 Fatty (change of) liver, not elsewhere classified: Secondary | ICD-10-CM | POA: Diagnosis present

## 2023-03-31 DIAGNOSIS — G4733 Obstructive sleep apnea (adult) (pediatric): Secondary | ICD-10-CM | POA: Diagnosis present

## 2023-03-31 DIAGNOSIS — L089 Local infection of the skin and subcutaneous tissue, unspecified: Secondary | ICD-10-CM | POA: Diagnosis not present

## 2023-03-31 DIAGNOSIS — G546 Phantom limb syndrome with pain: Secondary | ICD-10-CM | POA: Diagnosis present

## 2023-03-31 DIAGNOSIS — I509 Heart failure, unspecified: Secondary | ICD-10-CM | POA: Diagnosis not present

## 2023-03-31 DIAGNOSIS — Z89422 Acquired absence of other left toe(s): Secondary | ICD-10-CM

## 2023-03-31 DIAGNOSIS — L97529 Non-pressure chronic ulcer of other part of left foot with unspecified severity: Secondary | ICD-10-CM | POA: Diagnosis present

## 2023-03-31 DIAGNOSIS — E1151 Type 2 diabetes mellitus with diabetic peripheral angiopathy without gangrene: Secondary | ICD-10-CM | POA: Diagnosis not present

## 2023-03-31 DIAGNOSIS — L03116 Cellulitis of left lower limb: Secondary | ICD-10-CM | POA: Diagnosis present

## 2023-03-31 DIAGNOSIS — E1169 Type 2 diabetes mellitus with other specified complication: Secondary | ICD-10-CM | POA: Diagnosis present

## 2023-03-31 DIAGNOSIS — M86672 Other chronic osteomyelitis, left ankle and foot: Secondary | ICD-10-CM | POA: Diagnosis present

## 2023-03-31 DIAGNOSIS — T8781 Dehiscence of amputation stump: Secondary | ICD-10-CM | POA: Diagnosis not present

## 2023-03-31 DIAGNOSIS — E785 Hyperlipidemia, unspecified: Secondary | ICD-10-CM | POA: Diagnosis present

## 2023-03-31 DIAGNOSIS — F1020 Alcohol dependence, uncomplicated: Secondary | ICD-10-CM | POA: Diagnosis present

## 2023-03-31 DIAGNOSIS — I10 Essential (primary) hypertension: Secondary | ICD-10-CM

## 2023-03-31 DIAGNOSIS — Y838 Other surgical procedures as the cause of abnormal reaction of the patient, or of later complication, without mention of misadventure at the time of the procedure: Secondary | ICD-10-CM | POA: Diagnosis not present

## 2023-03-31 DIAGNOSIS — M869 Osteomyelitis, unspecified: Secondary | ICD-10-CM | POA: Diagnosis not present

## 2023-03-31 DIAGNOSIS — M159 Polyosteoarthritis, unspecified: Secondary | ICD-10-CM | POA: Diagnosis present

## 2023-03-31 DIAGNOSIS — E8729 Other acidosis: Secondary | ICD-10-CM | POA: Diagnosis not present

## 2023-03-31 DIAGNOSIS — Z96643 Presence of artificial hip joint, bilateral: Secondary | ICD-10-CM | POA: Diagnosis present

## 2023-03-31 DIAGNOSIS — I110 Hypertensive heart disease with heart failure: Secondary | ICD-10-CM | POA: Diagnosis present

## 2023-03-31 DIAGNOSIS — E8809 Other disorders of plasma-protein metabolism, not elsewhere classified: Secondary | ICD-10-CM | POA: Diagnosis not present

## 2023-03-31 DIAGNOSIS — I739 Peripheral vascular disease, unspecified: Secondary | ICD-10-CM | POA: Diagnosis not present

## 2023-03-31 DIAGNOSIS — L97524 Non-pressure chronic ulcer of other part of left foot with necrosis of bone: Secondary | ICD-10-CM

## 2023-03-31 DIAGNOSIS — E11628 Type 2 diabetes mellitus with other skin complications: Secondary | ICD-10-CM

## 2023-03-31 DIAGNOSIS — Z4781 Encounter for orthopedic aftercare following surgical amputation: Secondary | ICD-10-CM | POA: Diagnosis not present

## 2023-03-31 DIAGNOSIS — K219 Gastro-esophageal reflux disease without esophagitis: Secondary | ICD-10-CM | POA: Diagnosis present

## 2023-03-31 DIAGNOSIS — R000 Tachycardia, unspecified: Secondary | ICD-10-CM | POA: Diagnosis present

## 2023-03-31 DIAGNOSIS — E1152 Type 2 diabetes mellitus with diabetic peripheral angiopathy with gangrene: Principal | ICD-10-CM | POA: Diagnosis present

## 2023-03-31 DIAGNOSIS — E119 Type 2 diabetes mellitus without complications: Secondary | ICD-10-CM

## 2023-03-31 DIAGNOSIS — I5042 Chronic combined systolic (congestive) and diastolic (congestive) heart failure: Secondary | ICD-10-CM | POA: Diagnosis present

## 2023-03-31 DIAGNOSIS — M86472 Chronic osteomyelitis with draining sinus, left ankle and foot: Secondary | ICD-10-CM | POA: Diagnosis not present

## 2023-03-31 DIAGNOSIS — Z89512 Acquired absence of left leg below knee: Secondary | ICD-10-CM | POA: Diagnosis not present

## 2023-03-31 DIAGNOSIS — D638 Anemia in other chronic diseases classified elsewhere: Secondary | ICD-10-CM

## 2023-03-31 DIAGNOSIS — M2548 Effusion, other site: Secondary | ICD-10-CM | POA: Diagnosis present

## 2023-03-31 DIAGNOSIS — L0390 Cellulitis, unspecified: Secondary | ICD-10-CM | POA: Diagnosis present

## 2023-03-31 DIAGNOSIS — E876 Hypokalemia: Secondary | ICD-10-CM | POA: Diagnosis present

## 2023-03-31 DIAGNOSIS — E669 Obesity, unspecified: Secondary | ICD-10-CM | POA: Diagnosis present

## 2023-03-31 DIAGNOSIS — B9562 Methicillin resistant Staphylococcus aureus infection as the cause of diseases classified elsewhere: Secondary | ICD-10-CM | POA: Diagnosis present

## 2023-03-31 DIAGNOSIS — A480 Gas gangrene: Secondary | ICD-10-CM | POA: Diagnosis not present

## 2023-03-31 DIAGNOSIS — S91332A Puncture wound without foreign body, left foot, initial encounter: Secondary | ICD-10-CM

## 2023-03-31 DIAGNOSIS — Z96653 Presence of artificial knee joint, bilateral: Secondary | ICD-10-CM | POA: Diagnosis present

## 2023-03-31 DIAGNOSIS — E782 Mixed hyperlipidemia: Secondary | ICD-10-CM

## 2023-03-31 DIAGNOSIS — Z87891 Personal history of nicotine dependence: Secondary | ICD-10-CM | POA: Diagnosis not present

## 2023-03-31 DIAGNOSIS — Z79899 Other long term (current) drug therapy: Secondary | ICD-10-CM

## 2023-03-31 DIAGNOSIS — D62 Acute posthemorrhagic anemia: Secondary | ICD-10-CM | POA: Diagnosis not present

## 2023-03-31 DIAGNOSIS — F4322 Adjustment disorder with anxiety: Secondary | ICD-10-CM | POA: Diagnosis not present

## 2023-03-31 DIAGNOSIS — Z560 Unemployment, unspecified: Secondary | ICD-10-CM

## 2023-03-31 DIAGNOSIS — M659 Synovitis and tenosynovitis, unspecified: Secondary | ICD-10-CM | POA: Diagnosis present

## 2023-03-31 DIAGNOSIS — Y835 Amputation of limb(s) as the cause of abnormal reaction of the patient, or of later complication, without mention of misadventure at the time of the procedure: Secondary | ICD-10-CM | POA: Diagnosis not present

## 2023-03-31 DIAGNOSIS — Z888 Allergy status to other drugs, medicaments and biological substances status: Secondary | ICD-10-CM

## 2023-03-31 LAB — CBC WITH DIFFERENTIAL/PLATELET
Abs Immature Granulocytes: 0.06 10*3/uL (ref 0.00–0.07)
Basophils Absolute: 0 10*3/uL (ref 0.0–0.1)
Basophils Relative: 0 %
Eosinophils Absolute: 0 10*3/uL (ref 0.0–0.5)
Eosinophils Relative: 0 %
HCT: 36.8 % — ABNORMAL LOW (ref 39.0–52.0)
Hemoglobin: 12.2 g/dL — ABNORMAL LOW (ref 13.0–17.0)
Immature Granulocytes: 1 %
Lymphocytes Relative: 8 %
Lymphs Abs: 0.9 10*3/uL (ref 0.7–4.0)
MCH: 30 pg (ref 26.0–34.0)
MCHC: 33.2 g/dL (ref 30.0–36.0)
MCV: 90.6 fL (ref 80.0–100.0)
Monocytes Absolute: 0.8 10*3/uL (ref 0.1–1.0)
Monocytes Relative: 8 %
Neutro Abs: 8.8 10*3/uL — ABNORMAL HIGH (ref 1.7–7.7)
Neutrophils Relative %: 83 %
Platelets: 233 10*3/uL (ref 150–400)
RBC: 4.06 MIL/uL — ABNORMAL LOW (ref 4.22–5.81)
RDW: 14.6 % (ref 11.5–15.5)
WBC: 10.6 10*3/uL — ABNORMAL HIGH (ref 4.0–10.5)
nRBC: 0 % (ref 0.0–0.2)

## 2023-03-31 LAB — ETHANOL: Alcohol, Ethyl (B): <10 mg/dL (ref <10)

## 2023-03-31 LAB — COMPREHENSIVE METABOLIC PANEL
ALT: 44 U/L (ref 0–44)
AST: 33 U/L (ref 15–41)
Albumin: 2.9 g/dL — ABNORMAL LOW (ref 3.5–5.0)
Alkaline Phosphatase: 177 U/L — ABNORMAL HIGH (ref 38–126)
Anion gap: 16 — ABNORMAL HIGH (ref 5–15)
BUN: 20 mg/dL (ref 8–23)
CO2: 17 mmol/L — ABNORMAL LOW (ref 22–32)
Calcium: 9.2 mg/dL (ref 8.9–10.3)
Chloride: 102 mmol/L (ref 98–111)
Creatinine, Ser: 1 mg/dL (ref 0.61–1.24)
GFR, Estimated: >60 mL/min (ref >60)
Glucose, Bld: 106 mg/dL — ABNORMAL HIGH (ref 70–99)
Potassium: 3.4 mmol/L — ABNORMAL LOW (ref 3.5–5.1)
Sodium: 135 mmol/L (ref 135–145)
Total Bilirubin: 1.2 mg/dL (ref 0.3–1.2)
Total Protein: 8.3 g/dL — ABNORMAL HIGH (ref 6.5–8.1)

## 2023-03-31 LAB — LACTIC ACID, PLASMA
Lactic Acid, Venous: 1 mmol/L (ref 0.5–1.9)
Lactic Acid, Venous: 1.4 mmol/L (ref 0.5–1.9)

## 2023-03-31 LAB — MAGNESIUM: Magnesium: 2.1 mg/dL (ref 1.7–2.4)

## 2023-03-31 LAB — CK: Total CK: 43 U/L — ABNORMAL LOW (ref 49–397)

## 2023-03-31 LAB — TYPE AND SCREEN
ABO/RH(D): B POS
Antibody Screen: NEGATIVE

## 2023-03-31 LAB — HEMOGLOBIN A1C
Hgb A1c MFr Bld: 5.4 % (ref 4.8–5.6)
Mean Plasma Glucose: 108.28 mg/dL

## 2023-03-31 LAB — C-REACTIVE PROTEIN: CRP: 20.3 mg/dL — ABNORMAL HIGH (ref <1.0)

## 2023-03-31 LAB — SALICYLATE LEVEL: Salicylate Lvl: <7 mg/dL — ABNORMAL LOW (ref 7.0–30.0)

## 2023-03-31 MED ORDER — METRONIDAZOLE 500 MG/100ML IV SOLN
500.0000 mg | Freq: Two times a day (BID) | INTRAVENOUS | Status: DC
  Administered 2023-04-01 – 2023-04-04 (×8): 500 mg via INTRAVENOUS
  Filled 2023-03-31 (×8): qty 100

## 2023-03-31 MED ORDER — SODIUM CHLORIDE 0.9 % IV SOLN: 2.0000 g | Freq: Once | INTRAVENOUS | Status: DC

## 2023-03-31 MED ORDER — VANCOMYCIN HCL IN DEXTROSE 1-5 GM/200ML-% IV SOLN
1000.0000 mg | Freq: Two times a day (BID) | INTRAVENOUS | Status: DC
  Administered 2023-04-01 – 2023-04-03 (×5): 1000 mg via INTRAVENOUS
  Filled 2023-03-31 (×5): qty 200

## 2023-03-31 MED ORDER — ACETAMINOPHEN 650 MG RE SUPP: 650.0000 mg | Freq: Four times a day (QID) | RECTAL | Status: DC | PRN

## 2023-03-31 MED ORDER — INSULIN ASPART 100 UNIT/ML IJ SOLN
0.0000 [IU] | Freq: Four times a day (QID) | INTRAMUSCULAR | Status: DC
  Administered 2023-04-01 (×2): 2 [IU] via SUBCUTANEOUS
  Administered 2023-04-01 – 2023-04-02 (×2): 1 [IU] via SUBCUTANEOUS
  Administered 2023-04-02 (×2): 2 [IU] via SUBCUTANEOUS
  Administered 2023-04-02: 1 [IU] via SUBCUTANEOUS
  Administered 2023-04-03 (×3): 2 [IU] via SUBCUTANEOUS
  Administered 2023-04-04 – 2023-04-05 (×2): 1 [IU] via SUBCUTANEOUS
  Administered 2023-04-06: 2 [IU] via SUBCUTANEOUS

## 2023-03-31 MED ORDER — ONDANSETRON HCL 4 MG/2ML IJ SOLN: 4.0000 mg | Freq: Four times a day (QID) | INTRAMUSCULAR | Status: DC | PRN

## 2023-03-31 MED ORDER — GADOBUTROL 1 MMOL/ML IV SOLN
10.0000 mL | Freq: Once | INTRAVENOUS | Status: AC | PRN
  Administered 2023-03-31: 10 mL via INTRAVENOUS

## 2023-03-31 MED ORDER — SODIUM CHLORIDE 0.9 % IV SOLN
2.0000 g | Freq: Three times a day (TID) | INTRAVENOUS | Status: DC
  Administered 2023-03-31 – 2023-04-04 (×10): 2 g via INTRAVENOUS
  Filled 2023-03-31 (×10): qty 12.5

## 2023-03-31 MED ORDER — ACETAMINOPHEN 325 MG PO TABS: 650.0000 mg | ORAL_TABLET | Freq: Four times a day (QID) | ORAL | Status: DC | PRN

## 2023-03-31 MED ORDER — MELATONIN 3 MG PO TABS
3.0000 mg | ORAL_TABLET | Freq: Every evening | ORAL | Status: DC | PRN
  Administered 2023-04-01 – 2023-04-07 (×7): 3 mg via ORAL
  Filled 2023-03-31 (×7): qty 1

## 2023-03-31 MED ORDER — VANCOMYCIN HCL 2000 MG/400ML IV SOLN
2000.0000 mg | Freq: Once | INTRAVENOUS | Status: AC
  Administered 2023-04-01: 2000 mg via INTRAVENOUS
  Filled 2023-03-31: qty 400

## 2023-03-31 MED ORDER — VANCOMYCIN HCL IN DEXTROSE 1-5 GM/200ML-% IV SOLN: 1000.0000 mg | Freq: Once | INTRAVENOUS | Status: DC

## 2023-03-31 MED ORDER — NALOXONE HCL 0.4 MG/ML IJ SOLN: 0.4000 mg | INTRAMUSCULAR | Status: DC | PRN

## 2023-03-31 MED ORDER — HYDROMORPHONE HCL 1 MG/ML IJ SOLN: 0.5000 mg | INTRAMUSCULAR | Status: DC | PRN

## 2023-03-31 MED ORDER — POTASSIUM CHLORIDE CRYS ER 20 MEQ PO TBCR
40.0000 meq | EXTENDED_RELEASE_TABLET | Freq: Once | ORAL | Status: AC
  Administered 2023-03-31: 40 meq via ORAL
  Filled 2023-03-31: qty 2

## 2023-03-31 NOTE — ED Provider Notes (Signed)
**[name] De-Identified via Obfuscation** [name] [name]   CSN: [id number] Arrival date & time: 03/31/23  1551     History HTN, DM Chief Complaint  Patient presents with   Wound Infection    [name] [name] is a 62 y.o. male.  62 year old male with a past medical history of HTN, DM presents to the ED sent in by Dr. Annamary [name] of [facility].  Patient reports approximately 2 days ago he noted a new wound at the bottom of his foot, also noted increased drainage, redness, noticed that the wound may be infected.  He also had some chills yesterday, evaluated today in office and sent here for further workup.  Patient arrived to the ED tachycardic with a heart rate in the 120s. Has not had any fever, no diarrhea no other complaints.  Prior hx of multiple toe amputations to BL feet.   The history is provided by the patient.       Home Medications Prior to Admission medications   Medication Sig Start Date End Date Taking? Authorizing Provider  acamprosate (CAMPRAL) 333 MG tablet Take 1 tablet (333 mg total) by mouth 2 (two) times daily. 10/26/22  Yes Burns, [name] Mo, MD  amLODipine (NORVASC) 5 MG tablet Take 1 tablet (5 mg total) by mouth daily. 10/26/22  Yes Burns, [name] Mo, MD  atorvastatin (LIPITOR) 80 MG tablet TAKE 1 TABLET BY MOUTH EVERY DAY 02/16/23  Yes Burns, [name] Mo, MD  cholecalciferol (VITAMIN D3) 25 MCG (1000 UNIT) tablet Take 2,000 Units by mouth daily.   Yes [provider]  folic acid (FOLVITE) 1 MG tablet TAKE 1 TABLET BY MOUTH EVERY DAY Patient taking differently: Take 1 mg by mouth daily. 01/24/23  Yes Burns, [name] Mo, MD  furosemide (LASIX) 40 MG tablet TAKE 1 TABLET BY MOUTH DAILY AS NEEDED Patient taking differently: Take 40 mg by mouth daily as needed for fluid or edema. 01/03/23  Yes Burns, [name] Mo, MD  ibuprofen (ADVIL) 200 MG tablet Take 600 mg by mouth 2 (two) times daily as needed for moderate pain. 2 tabs in am and 2 tabs at hs   Yes [provider]  Magnesium 400 MG TABS Take 400 mg by mouth daily. 06/22/22  Yes Burns, [name] Mo, MD  meloxicam (MOBIC) 7.5 MG tablet TAKE 1 TABLET BY MOUTH EVERY DAY Patient taking differently: Take 7.5 mg by mouth daily as needed for pain. 01/10/23  Yes Burns, [name] Mo, MD  metoprolol succinate (TOPROL-XL) 25 MG 24 hr tablet TAKE 1 TABLET BY MOUTH DAILY Patient taking differently: Take 25 mg by mouth daily. 06/22/22  Yes Burns, [name] Mo, MD  nitroGLYCERIN (NITROSTAT) 0.4 MG SL tablet Place 1 tablet (0.4 mg total) under the tongue every 5 (five) minutes x 3 doses as needed for chest pain. 11/19/21  Yes [name] Bong, MD  NONFORMULARY OR COMPOUNDED ITEM Apply 1 Application topically See admin instructions. Bassa- gel + Spc Vancomycin- Gentamicin - clotrimazole - 33%-25%-5% Add 1 scoop (500mg ) of compounded powder and 10 pumps of Bassa-Gel to mixing jar, mix together until mixed completely. Apply the gel to the affected area(s) daily or with dressing changes as directed DISCARD AFTER 4 HOURS.   Yes [provider]  thiamine (VITAMIN B1) 100 MG tablet TAKE 1 TABLET BY MOUTH EVERY DAY 12/20/22  Yes Burns, [name] Mo, MD  vitamin B-12 (CYANOCOBALAMIN) 100 MCG tablet TAKE 1 TABLET BY MOUTH EVERY DAY Patient taking differently: Take 100 mcg  by mouth daily. 12/20/22  Yes Burns, [name] Mo, MD  losartan (COZAAR) 25 MG tablet TAKE 1 TABLET (25 MG TOTAL) BY MOUTH DAILY. Patient not taking: Reported on 03/31/2023 12/20/22   [name] Sanes, MD  sertraline (ZOLOFT) 50 MG tablet TAKE 1 TABLET BY MOUTH EVERY DAY Patient not taking: Reported on 03/31/2023 11/15/22   [name] Sanes, MD      Allergies    Claritin [loratadine]    Review of Systems   Review of Systems  Constitutional:  Positive for chills.  Respiratory:  Negative for shortness of breath.   Cardiovascular:  Negative for chest pain.  Genitourinary:  Negative for flank pain.  Musculoskeletal:  Negative for back pain.  Skin:  Positive for wound.  All other  systems reviewed and are negative.   Physical Exam Updated Vital Signs BP (!) 131/92 (BP Location: Right Arm)   Pulse (!) 124   Temp 98.2 F (36.8 C)   Resp 18   Ht 6' (1.829 m)   Wt 126.3 kg   SpO2 99%   BMI 37.76 kg/m  Physical Exam Vitals and nursing [name] reviewed.  Constitutional:      Appearance: Normal appearance. He is obese.  HENT:     Head: Normocephalic and atraumatic.     Mouth/Throat:     Mouth: Mucous membranes are moist.  Cardiovascular:     Rate and Rhythm: Normal rate.  Pulmonary:     Effort: Pulmonary effort is normal.  Abdominal:     General: Abdomen is flat.  Musculoskeletal:     Cervical back: Normal range of motion and neck supple.     Right lower leg: No edema.     Left lower leg: No edema.  Skin:    General: Skin is warm and dry.  Neurological:     Mental Status: He is alert and oriented to person, place, and time.        ED Results / Procedures / Treatments   Labs (all labs ordered are listed, but only abnormal results are displayed) Labs Reviewed  COMPREHENSIVE METABOLIC PANEL - Abnormal; Notable for the following components:      Result Value   Potassium 3.4 (*)    CO2 17 (*)    Glucose, Bld 106 (*)    Total Protein 8.3 (*)    Albumin 2.9 (*)    Alkaline Phosphatase 177 (*)    Anion gap 16 (*)    All other components within normal limits  CBC WITH DIFFERENTIAL/PLATELET - Abnormal; Notable for the following components:   WBC 10.6 (*)    RBC 4.06 (*)    Hemoglobin 12.2 (*)    HCT 36.8 (*)    Neutro Abs 8.8 (*)    All other components within normal limits  CULTURE, BLOOD (ROUTINE X 2)  CULTURE, BLOOD (ROUTINE X 2)  LACTIC ACID, PLASMA  LACTIC ACID, PLASMA    EKG None  Radiology DG Foot Complete Left  Result Date: 03/31/2023 CLINICAL DATA:  Swelling.  Pain.  Ulcer EXAM: LEFT FOOT - COMPLETE 3 VIEW COMPARISON:  X-ray 11/23/2022 FINDINGS: Severe soft tissue swelling about the midfoot which is new. There is a new fracture  involving the neck of the fifth metatarsal since the prior examination. Severe osteopenia. Hallux valgus deformity of the first ray with hypertrophic degenerative changes similar to previous. Degenerative changes of the midfoot with flatfoot deformity. Large plantar calcaneal spur and mild Achilles spur. Degenerative changes of the ankle and hindfoot as well.  Severe osteopenia. No definite erosive changes at this time. If there is concern further of bone infection, MRI or bone scan could be considered as clinically appropriate. Stable changes of previous amputation involving the digits of third ray in the distal aspect of the third metatarsal. IMPRESSION: Development of severe soft tissue swelling about the midfoot. No underlying bony erosive changes at this time. Flatfoot deformity with degenerative changes and hallux valgus deformity of the first ray. Since the prior there is a fracture of the neck of fifth metatarsal. Please correlate with time course of injury. Electronically Signed   By: Karen Kays M.D.   On: 03/31/2023 16:40    Procedures Procedures    Medications Ordered in ED Medications  vancomycin (VANCOCIN) IVPB 1000 mg/200 mL premix (has no administration in time range)  ceFEPIme (MAXIPIME) 2 g in sodium chloride 0.9 % 100 mL IVPB (has no administration in time range)    ED Course/ Medical Decision Making/ A&P                             Medical Decision Making Amount and/or Complexity of Data Reviewed Labs: ordered. Radiology: ordered.  Risk Prescription drug management.   This patient presents to the ED for concern of left foot infection, this involves a number of treatment options, and is a complaint that carries with it a high risk of complications and morbidity.  The differential diagnosis includes cellulitis, osteomyelitis versus Sepsis.    Co morbidities: Discussed in HPI   Brief History:  See HPI.   EMR reviewed including pt PMHx, past surgical history and  past visits to ER.   See HPI for more details   Lab Tests:  I ordered and independently interpreted labs.  The pertinent results include:    CBC with leukocytosis of 10.6, hemoglobin slightly decreased.  CMP with slight decrease in potassium, creatinine level is within normal limits.  LFTs are unremarkable.  I have also added a lactic along with blood cultures at this time.   Imaging Studies:  Xray of the left foot: MPRESSION:  Development of severe soft tissue swelling about the midfoot. No  underlying bony erosive changes at this time.    Flatfoot deformity with degenerative changes and hallux valgus  deformity of the first ray.    Since the prior there is a fracture of the neck of fifth metatarsal.  Please correlate with time course of injury.   MRI foot ordered but pending.    Cardiac Monitoring:  N/A  Medicines ordered:  I ordered medication including broad spectrum antibiotics  for prophylactic coverage. Reevaluation of the patient after these medicines showed that the patient stayed the same I have reviewed the patients home medicines and have made adjustments as needed  Consults:  I requested consultation with Dr. Ralene Cork,  and discussed lab and imaging findings as well as pertinent plan - they recommend: admission, needs IV abx along with MRI foot and NPO after midnight as likely will need intervention in the morning via operating room.  Reevaluation:  After the interventions noted above I re-evaluated patient and found that they have :stayed the same  Social Determinants of Health:  The patient's social determinants of health were a factor in the care of this patient  Problem List / ED Course:  Patient with underlying history of diabetes, hypertension presents to the ED sent in by podiatry office for admission due to left foot infection.  Symptoms have  been ongoing for 2 days, subjective fever along with chills experienced yesterday.  Has worsening disease  to his left foot with a new wound is actively draining with erythema.  Pulses diminished to has left lower leg.  There is warmth along with erythema surrounding the wound.  Please see photos attached from prior provider.  Patient has not been taking any recent antibiotics.  When he is asked about diabetes, he reports he does not have diabetes at this time although this is listed on his records.  Arrived to the ED tachycardic with a heart in the 120s, concern for sepsis as well. Interpretation of his labs reveal a CBC with a leukocytosis of 10, electrolytes are within normal limits, his glucose is slightly elevated at 106, creatinine levels unremarkable.  Does not have any other systemic issues at this time.  I discussed this case with podiatry on-call Dr. Ralene Cork who recommended IV antibiotics, MRI foot, n.p.o. after midnight is likely patient will be need to be taken to the OR tomorrow morning.  Patient has been started on broad-spectrum antibiotics such as vancomycin along with cefepime to cover for his left diabetic foot. Patient along with his wife were updated on this plan, he is aware he will be needing admission, will remain n.p.o. after midnight.  Call placed for hospitalist in order to have patient admitted at this time. Spoke to Dr. Arlean Hopping, hospitlalist who agreed on admission at this time. Appreciate his assistance as always.   Dispostion:  After consideration of the diagnostic results and the patients response to treatment, I feel that the patent would benefit from admission for further management of his left foot infection.    Portions of this [name] were generated with Scientist, clinical (histocompatibility and immunogenetics). Dictation errors may occur despite best attempts at proofreading.   Final Clinical Impression(s) / ED Diagnoses Final diagnoses:  Left foot infection    Rx / DC Orders ED Discharge Orders     None         Claude Manges, Cordelia Poche 03/31/23 1958    Gwyneth Sprout, MD 03/31/23 [id number]

## 2023-03-31 NOTE — ED Notes (Signed)
Patient has returned from MRI

## 2023-03-31 NOTE — ED Notes (Signed)
Patient transported to MRI 
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
## 2023-03-31 NOTE — ED Triage Notes (Signed)
Pt was sent by [facility] for infection of wound on left foot. Pt hyas stage 2 ulcer on posterior of left foot measuring 5cmx5cm. The bedding is pink. Pt has an ulcer on the anterior of first left toes. Pt's left foot has 4+ swelling and is erythematous. Pt has 2+ left pedal pulse, cap refill less than 3 sec. Pt's foot is hot to touch
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
## 2023-03-31 NOTE — Progress Notes (Addendum)
Unit RN able to place USGPIV
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
## 2023-03-31 NOTE — Progress Notes (Signed)
Pharmacy Antibiotic Note  [name] is a 62 y.o. male admitted on 03/31/2023 with diabetic foot infection with concerns for osteomyelitis. Pharmacy has been consulted for cefepime and vancomycin dosing. WBC 10.6, sCr 1, afebrile.  Plan: Cefepime 2g IV every 8 hours  Vancomycin 2g IV x1 Vancomycin 1000mg  IV every 12 hours (AUC 422, Vd 0.5, sCr 1) Monitor renal function, WBC, fever curve Follow up plans for OR and antibiotic LOT  Height: 6' (182.9 cm) Weight: 126.3 kg (278 lb 7.1 oz) IBW/kg (Calculated) : 77.6  Temp (24hrs), Avg:98.2 F (36.8 C), Min:98.2 F (36.8 C), Max:98.2 F (36.8 C)  Recent Labs  Lab 03/31/23 1610  WBC 10.6*  CREATININE 1.00    Estimated Creatinine Clearance: 106.5 mL/min (by C-G formula based on SCr of 1 mg/dL).    Allergies  Allergen Reactions   Claritin [Loratadine] Shortness Of Breath and Anxiety    Antimicrobials this admission: Cefepime 6/13 >>  Vancomycin 6/13 >>   Microbiology results: 6/13 BCx:   Thank you for allowing pharmacy to be a part of this patient's care.  Arabella Merles, PharmD. [facility] Acute Care PGY-1 03/31/2023 7:36 PM

## 2023-04-01 ENCOUNTER — Inpatient Hospital Stay (HOSPITAL_COMMUNITY): Payer: Medicare Other | Admitting: Anesthesiology

## 2023-04-01 ENCOUNTER — Encounter (HOSPITAL_COMMUNITY)
Admission: EM | Disposition: A | Discharge status: Rehabilitation Facility | Payer: Self-pay | Source: Ambulatory Visit | Attending: Internal Medicine

## 2023-04-01 ENCOUNTER — Encounter (HOSPITAL_COMMUNITY): Payer: Self-pay | Admitting: Internal Medicine

## 2023-04-01 ENCOUNTER — Ambulatory Visit: Payer: Medicare Other | Admitting: Podiatry

## 2023-04-01 ENCOUNTER — Inpatient Hospital Stay (HOSPITAL_COMMUNITY): Payer: Medicare Other

## 2023-04-01 ENCOUNTER — Other Ambulatory Visit: Payer: Self-pay

## 2023-04-01 DIAGNOSIS — L089 Local infection of the skin and subcutaneous tissue, unspecified: Secondary | ICD-10-CM | POA: Diagnosis not present

## 2023-04-01 DIAGNOSIS — E119 Type 2 diabetes mellitus without complications: Secondary | ICD-10-CM | POA: Diagnosis not present

## 2023-04-01 DIAGNOSIS — E11628 Type 2 diabetes mellitus with other skin complications: Secondary | ICD-10-CM

## 2023-04-01 DIAGNOSIS — Z87891 Personal history of nicotine dependence: Secondary | ICD-10-CM

## 2023-04-01 DIAGNOSIS — I10 Essential (primary) hypertension: Secondary | ICD-10-CM

## 2023-04-01 DIAGNOSIS — E1151 Type 2 diabetes mellitus with diabetic peripheral angiopathy without gangrene: Secondary | ICD-10-CM

## 2023-04-01 DIAGNOSIS — I5042 Chronic combined systolic (congestive) and diastolic (congestive) heart failure: Secondary | ICD-10-CM | POA: Diagnosis not present

## 2023-04-01 DIAGNOSIS — I2510 Atherosclerotic heart disease of native coronary artery without angina pectoris: Secondary | ICD-10-CM

## 2023-04-01 DIAGNOSIS — S91332A Puncture wound without foreign body, left foot, initial encounter: Secondary | ICD-10-CM | POA: Diagnosis not present

## 2023-04-01 HISTORY — PX: AMPUTATION: SHX166

## 2023-04-01 LAB — CULTURE, BLOOD (ROUTINE X 2): Special Requests: ADEQUATE

## 2023-04-01 LAB — GLUCOSE, CAPILLARY
Glucose-Capillary: 107 mg/dL — ABNORMAL HIGH (ref 70–99)
Glucose-Capillary: 113 mg/dL — ABNORMAL HIGH (ref 70–99)
Glucose-Capillary: 126 mg/dL — ABNORMAL HIGH (ref 70–99)
Glucose-Capillary: 148 mg/dL — ABNORMAL HIGH (ref 70–99)
Glucose-Capillary: 159 mg/dL — ABNORMAL HIGH (ref 70–99)
Glucose-Capillary: 167 mg/dL — ABNORMAL HIGH (ref 70–99)

## 2023-04-01 LAB — CBC WITH DIFFERENTIAL/PLATELET
Abs Immature Granulocytes: 0.04 10*3/uL (ref 0.00–0.07)
Basophils Absolute: 0 10*3/uL (ref 0.0–0.1)
Basophils Relative: 0 %
Eosinophils Absolute: 0 10*3/uL (ref 0.0–0.5)
Eosinophils Relative: 1 %
HCT: 30.7 % — ABNORMAL LOW (ref 39.0–52.0)
Hemoglobin: 10.2 g/dL — ABNORMAL LOW (ref 13.0–17.0)
Immature Granulocytes: 1 %
Lymphocytes Relative: 13 %
Lymphs Abs: 0.7 10*3/uL (ref 0.7–4.0)
MCH: 29.8 pg (ref 26.0–34.0)
MCHC: 33.2 g/dL (ref 30.0–36.0)
MCV: 89.8 fL (ref 80.0–100.0)
Monocytes Absolute: 0.4 10*3/uL (ref 0.1–1.0)
Monocytes Relative: 9 %
Neutro Abs: 3.9 10*3/uL (ref 1.7–7.7)
Neutrophils Relative %: 76 %
Platelets: 172 10*3/uL (ref 150–400)
RBC: 3.42 MIL/uL — ABNORMAL LOW (ref 4.22–5.81)
RDW: 14.7 % (ref 11.5–15.5)
WBC: 5.1 10*3/uL (ref 4.0–10.5)
nRBC: 0 % (ref 0.0–0.2)

## 2023-04-01 LAB — COMPREHENSIVE METABOLIC PANEL
ALT: 34 U/L (ref 0–44)
AST: 26 U/L (ref 15–41)
Albumin: 2.3 g/dL — ABNORMAL LOW (ref 3.5–5.0)
Alkaline Phosphatase: 153 U/L — ABNORMAL HIGH (ref 38–126)
Anion gap: 10 (ref 5–15)
BUN: 23 mg/dL (ref 8–23)
CO2: 20 mmol/L — ABNORMAL LOW (ref 22–32)
Calcium: 8.9 mg/dL (ref 8.9–10.3)
Chloride: 103 mmol/L (ref 98–111)
Creatinine, Ser: 0.99 mg/dL (ref 0.61–1.24)
GFR, Estimated: >60 mL/min (ref >60)
Glucose, Bld: 174 mg/dL — ABNORMAL HIGH (ref 70–99)
Potassium: 3.6 mmol/L (ref 3.5–5.1)
Sodium: 133 mmol/L — ABNORMAL LOW (ref 135–145)
Total Bilirubin: 0.9 mg/dL (ref 0.3–1.2)
Total Protein: 6.7 g/dL (ref 6.5–8.1)

## 2023-04-01 LAB — PROTIME-INR
INR: 1.2 (ref 0.8–1.2)
Prothrombin Time: 15.2 seconds (ref 11.4–15.2)

## 2023-04-01 LAB — SEDIMENTATION RATE: Sed Rate: 122 mm/hr — ABNORMAL HIGH (ref 0–16)

## 2023-04-01 LAB — SURGICAL PCR SCREEN
MRSA, PCR: POSITIVE — AB
Staphylococcus aureus: POSITIVE — AB

## 2023-04-01 LAB — MAGNESIUM: Magnesium: 1.9 mg/dL (ref 1.7–2.4)

## 2023-04-01 SURGERY — AMPUTATION, FOOT, PARTIAL
Anesthesia: Monitor Anesthesia Care | Site: Foot | Laterality: Left

## 2023-04-01 MED ORDER — FENTANYL CITRATE (PF) 250 MCG/5ML IJ SOLN
INTRAMUSCULAR | Status: DC | PRN
  Administered 2023-04-01: 100 ug via INTRAVENOUS
  Administered 2023-04-01: 50 ug via INTRAVENOUS

## 2023-04-01 MED ORDER — LIDOCAINE HCL 2 % IJ SOLN
INTRAMUSCULAR | Status: DC | PRN
  Administered 2023-04-01: 20 mL

## 2023-04-01 MED ORDER — FENTANYL CITRATE (PF) 250 MCG/5ML IJ SOLN
INTRAMUSCULAR | Status: AC
  Filled 2023-04-01: qty 5

## 2023-04-01 MED ORDER — CHLORHEXIDINE GLUCONATE CLOTH 2 % EX PADS
6.0000 | MEDICATED_PAD | Freq: Every day | CUTANEOUS | Status: AC
  Administered 2023-04-01 – 2023-04-05 (×5): 6 via TOPICAL

## 2023-04-01 MED ORDER — LIDOCAINE HCL 2 % IJ SOLN
INTRAMUSCULAR | Status: AC
  Filled 2023-04-01: qty 20

## 2023-04-01 MED ORDER — METOPROLOL SUCCINATE ER 25 MG PO TB24: 25.0000 mg | ORAL_TABLET | Freq: Once | ORAL | Status: AC

## 2023-04-01 MED ORDER — PROPOFOL 500 MG/50ML IV EMUL
INTRAVENOUS | Status: DC | PRN
  Administered 2023-04-01: 100 ug/kg/min via INTRAVENOUS

## 2023-04-01 MED ORDER — CHLORHEXIDINE GLUCONATE 0.12 % MT SOLN: 15.0000 mL | Freq: Once | OROMUCOSAL | Status: AC

## 2023-04-01 MED ORDER — 0.9 % SODIUM CHLORIDE (POUR BTL) OPTIME
TOPICAL | Status: DC | PRN
  Administered 2023-04-01: 1000 mL

## 2023-04-01 MED ORDER — LORAZEPAM 2 MG/ML IJ SOLN: 0.5000 mg | Freq: Four times a day (QID) | INTRAMUSCULAR | Status: DC | PRN

## 2023-04-01 MED ORDER — PROPOFOL 10 MG/ML IV BOLUS
INTRAVENOUS | Status: DC | PRN
  Administered 2023-04-01: 30 mg via INTRAVENOUS

## 2023-04-01 MED ORDER — HYDROXYZINE HCL 25 MG PO TABS
25.0000 mg | ORAL_TABLET | Freq: Three times a day (TID) | ORAL | Status: DC | PRN
  Administered 2023-04-01 – 2023-04-06 (×7): 25 mg via ORAL
  Filled 2023-04-01 (×7): qty 1

## 2023-04-01 MED ORDER — PROPOFOL 10 MG/ML IV BOLUS
INTRAVENOUS | Status: AC
  Filled 2023-04-01: qty 20

## 2023-04-01 MED ORDER — METOPROLOL SUCCINATE ER 25 MG PO TB24
ORAL_TABLET | ORAL | Status: AC
  Administered 2023-04-01: 25 mg via ORAL
  Filled 2023-04-01: qty 1

## 2023-04-01 MED ORDER — BUPIVACAINE HCL (PF) 0.5 % IJ SOLN
INTRAMUSCULAR | Status: AC
  Filled 2023-04-01: qty 30

## 2023-04-01 MED ORDER — MUPIROCIN 2 % EX OINT
1.0000 | TOPICAL_OINTMENT | Freq: Two times a day (BID) | CUTANEOUS | Status: AC
  Administered 2023-04-01 – 2023-04-05 (×10): 1 via NASAL
  Filled 2023-04-01: qty 22

## 2023-04-01 MED ORDER — CHLORHEXIDINE GLUCONATE 0.12 % MT SOLN
OROMUCOSAL | Status: AC
  Administered 2023-04-01: 15 mL via OROMUCOSAL
  Filled 2023-04-01: qty 15

## 2023-04-01 MED ORDER — LACTATED RINGERS IV SOLN: INTRAVENOUS | Status: DC

## 2023-04-01 MED ORDER — BUPIVACAINE HCL (PF) 0.5 % IJ SOLN
INTRAMUSCULAR | Status: DC | PRN
  Administered 2023-04-01: 30 mL

## 2023-04-01 MED ORDER — CHLORHEXIDINE GLUCONATE CLOTH 2 % EX PADS
6.0000 | MEDICATED_PAD | Freq: Once | CUTANEOUS | Status: AC
  Administered 2023-04-01: 6 via TOPICAL

## 2023-04-01 MED ORDER — CHLORHEXIDINE GLUCONATE CLOTH 2 % EX PADS: 6.0000 | MEDICATED_PAD | Freq: Once | CUTANEOUS | Status: AC

## 2023-04-01 MED ORDER — INSULIN ASPART 100 UNIT/ML IJ SOLN: 0.0000 [IU] | INTRAMUSCULAR | Status: DC | PRN

## 2023-04-01 MED ORDER — ORAL CARE MOUTH RINSE: 15.0000 mL | Freq: Once | OROMUCOSAL | Status: AC

## 2023-04-01 MED ORDER — MIDAZOLAM HCL 2 MG/2ML IJ SOLN
INTRAMUSCULAR | Status: AC
  Filled 2023-04-01: qty 2

## 2023-04-01 MED ORDER — MIDAZOLAM HCL 2 MG/2ML IJ SOLN
INTRAMUSCULAR | Status: DC | PRN
  Administered 2023-04-01: 2 mg via INTRAVENOUS

## 2023-04-01 MED ORDER — LIDOCAINE 2% (20 MG/ML) 5 ML SYRINGE
INTRAMUSCULAR | Status: DC | PRN
  Administered 2023-04-01: 60 mg via INTRAVENOUS

## 2023-04-01 SURGICAL SUPPLY — 59 items
APL PRP STRL LF DISP 70% ISPRP (MISCELLANEOUS) ×1
BAG COUNTER SPONGE SURGICOUNT (BAG) ×1 IMPLANT
BAG SPNG CNTER NS LX DISP (BAG) ×1
BLADE SAW SGTL 83.5X18.5 (BLADE) ×1 IMPLANT
BNDG CMPR 5X4 KNIT ELC UNQ LF (GAUZE/BANDAGES/DRESSINGS) ×1
BNDG CMPR 5X6 CHSV STRCH STRL (GAUZE/BANDAGES/DRESSINGS)
BNDG CMPR 9X4 STRL LF SNTH (GAUZE/BANDAGES/DRESSINGS) ×1
BNDG COHESIVE 6X5 TAN ST LF (GAUZE/BANDAGES/DRESSINGS) IMPLANT
BNDG ELASTIC 4INX 5YD STR LF (GAUZE/BANDAGES/DRESSINGS) IMPLANT
BNDG ELASTIC 4X5.8 VLCR STR LF (GAUZE/BANDAGES/DRESSINGS) IMPLANT
BNDG ESMARK 4X9 LF (GAUZE/BANDAGES/DRESSINGS) IMPLANT
BNDG GAUZE DERMACEA FLUFF 4 (GAUZE/BANDAGES/DRESSINGS) IMPLANT
BNDG GZE DERMACEA 4 6PLY (GAUZE/BANDAGES/DRESSINGS) ×1
CHLORAPREP W/TINT 26 (MISCELLANEOUS) ×1 IMPLANT
COVER SURGICAL LIGHT HANDLE (MISCELLANEOUS) ×1 IMPLANT
CUFF TOURN SGL QUICK 18X4 (TOURNIQUET CUFF) IMPLANT
CUFF TOURN SGL QUICK 34 (TOURNIQUET CUFF)
CUFF TRNQT CYL 34X4.125X (TOURNIQUET CUFF) IMPLANT
DRAPE OEC MINIVIEW 54X84 (DRAPES) IMPLANT
ELECT CAUTERY BLADE 6.4 (BLADE) ×1 IMPLANT
ELECT REM PT RETURN 9FT ADLT (ELECTROSURGICAL) ×1
ELECTRODE REM PT RTRN 9FT ADLT (ELECTROSURGICAL) ×1 IMPLANT
GAUZE PAD ABD 8X10 STRL (GAUZE/BANDAGES/DRESSINGS) IMPLANT
GAUZE SPONGE 4X4 12PLY STRL (GAUZE/BANDAGES/DRESSINGS) IMPLANT
GAUZE XEROFORM 1X8 LF (GAUZE/BANDAGES/DRESSINGS) IMPLANT
GLOVE BIO SURGEON STRL SZ 6.5 (GLOVE) IMPLANT
GLOVE BIO SURGEON STRL SZ7.5 (GLOVE) ×1 IMPLANT
GLOVE BIOGEL PI IND STRL 7.0 (GLOVE) IMPLANT
GLOVE BIOGEL PI IND STRL 7.5 (GLOVE) ×1 IMPLANT
GOWN STRL REUS W/ TWL LRG LVL3 (GOWN DISPOSABLE) ×2 IMPLANT
GOWN STRL REUS W/TWL LRG LVL3 (GOWN DISPOSABLE) ×2
HANDPIECE INTERPULSE COAX TIP (DISPOSABLE)
KIT BASIN OR (CUSTOM PROCEDURE TRAY) ×1 IMPLANT
KIT TURNOVER KIT B (KITS) ×1 IMPLANT
MANIFOLD NEPTUNE II (INSTRUMENTS) ×1 IMPLANT
NDL HYPO 25GX1X1/2 BEV (NEEDLE) ×1 IMPLANT
NEEDLE HYPO 25GX1X1/2 BEV (NEEDLE) ×1 IMPLANT
NS IRRIG 1000ML POUR BTL (IV SOLUTION) ×1 IMPLANT
PACK ORTHO EXTREMITY (CUSTOM PROCEDURE TRAY) ×1 IMPLANT
PAD ARMBOARD 7.5X6 YLW CONV (MISCELLANEOUS) ×2 IMPLANT
PAD CAST 4YDX4 CTTN HI CHSV (CAST SUPPLIES) IMPLANT
PADDING CAST COTTON 4X4 STRL (CAST SUPPLIES)
PADDING CAST COTTON 6X4 STRL (CAST SUPPLIES) IMPLANT
SET HNDPC FAN SPRY TIP SCT (DISPOSABLE) IMPLANT
SOL PREP POV-IOD 4OZ 10% (MISCELLANEOUS) ×1 IMPLANT
SOL SCRUB PVP POV-IOD 4OZ 7.5% (MISCELLANEOUS) ×1
SOLUTION SCRB POV-IOD 4OZ 7.5% (MISCELLANEOUS) ×1 IMPLANT
SPONGE T-LAP 18X18 ~~LOC~~+RFID (SPONGE) IMPLANT
STAPLER VISISTAT 35W (STAPLE) IMPLANT
SUT PROLENE 3 0 PS 2 (SUTURE) IMPLANT
SWAB COLLECTION DEVICE MRSA (MISCELLANEOUS) IMPLANT
SWAB CULTURE ESWAB REG 1ML (MISCELLANEOUS) IMPLANT
SWAB CULTURE LIQ STUART DBL (MISCELLANEOUS) ×1 IMPLANT
SWAB CULTURE LIQUID MINI MALE (MISCELLANEOUS) ×1 IMPLANT
SYR CONTROL 10ML LL (SYRINGE) ×1 IMPLANT
TOWEL GREEN STERILE (TOWEL DISPOSABLE) ×1 IMPLANT
TOWEL GREEN STERILE FF (TOWEL DISPOSABLE) ×1 IMPLANT
TUBE CONNECTING 12X1/4 (SUCTIONS) ×1 IMPLANT
YANKAUER SUCT BULB TIP NO VENT (SUCTIONS) ×1 IMPLANT

## 2023-04-01 NOTE — Progress Notes (Signed)
RN gave report to 5N RN. When RN was getting pt ready to transport, RN noted bleeding coming from dressing on surgical foot. Sikora MD was notified regarding the bleeding coming from surgical foot. RN advised to hold pressure and pack incision with gaused, ABD pads, and wrap with kurlex and ace wrap and to monitor pt in PACU until Ralene Cork comes to check pt surgical site. Pt connect to monitor, bed and gown changed, surgical foot redressed.

## 2023-04-01 NOTE — Op Note (Signed)
OPERATIVE REPORT Patient name: [name] MRN: [id number] DOB: Sep 07, 1961  DOS:  04/01/23  Preop Dx: Left foot infection Postop Dx: same  Procedure:  1. Left transmetatarsal amputation possible serial irrigation and debrideiment  Surgeon: Louann Sjogren, DPM  Anesthesia: 50-50 mixture of 2% lidocaine plain with 0.5% Marcaine plain totaling 20 infiltrated in the patient's left lower extremity  Hemostasis: No TQ necessary   EBL: 100 mL Materials: n/a Injectables: as above Pathology: left forefoot for culture and residual fifth metatarsal on left for culture   Condition: The patient tolerated the procedure and anesthesia well. No complications noted or reported   Justification for procedure: The patient is a 62 y.o.  male who presents today for surgical treatment of left diabetic foot infection and osteomyelitis.  All conservative modalities of been unsuccessful in providing any sort of satisfactory alleviation of symptoms with the patient. The patient was told benefits as well as possible side effects of the surgery. The patient consented for surgical correction. The patient consent form was reviewed. All patient questions were answered. No guarantees were expressed or implied. The patient and the surgeon both signed the patient consent form with the witness present and placed in the patient's chart.   Procedure in Detail: The patient was brought to the operating room, placed in the operating table in the supine position at which time an aseptic scrub and drape were performed about the patient's respective lower extremity after anesthesia was induced as described above. Attention was then directed to the surgical area where procedure number one commenced.  Procedure #1:   . Attention was directed to the operative extremity with visible site marking. Incision was planned with skin marker. Dorsal incision was then made with a 15 blade through skin and subcutaneous tissue down to  bone. Electrocautery was used to ligate any bleeders. Incision was then made along the plantar aspect of the foot careful to ligate any bleeders with electrocautery. Next, the bony cuts were made using sagittal saw, and these were made approximately at the mid level of the metatarsals and in a cascade fashion, transecting all of the 5 metatarsal bones. Upon removal residual metatarsals 1 and 2 were necrotic and soft and resection was made more proximal. First metatarsal was resected nearly completely due to infection. Next, the amputated forefoot was then removed. Following this, the wound was thoroughly irrigated and then clean area of bone was biopsied for culture. Hemostasis was then obtained After full hemostasis had been obtained, the wound was again thoroughly irrigated and then the dorsal plantar flap was further tailored so that the plantar flap was lying in a dorsal direction. One nylon suture was used to hold flaps together. The wound was then dressed with saline soaked gauze and left packed open. The foot was dressed with 4 x 4s ABDs, Kerlex and Ace.  The patient awakened and transferred to the recovery room in a stable condition.  DISPO: Patient to return to medical floor. Continue IV Abx. Concern for spread of infection and viability of the foot. Will plan to follow and potential delayed primary closure but patient remains at high risk for limb loss.   Louann Sjogren, DPM      Louann Sjogren, DPM [facility]  Dr. Louann Sjogren, DPM    [address]  [place], [place] [address]                Office [phone]  Fax [phone]   

## 2023-04-01 NOTE — Transfer of Care (Signed)
Immediate Anesthesia Transfer of Care Note  Patient: [name]  Procedure(s) Performed: AMPUTATION LEFT FOREFOOT (Left: Foot)  Patient Location: PACU  Anesthesia Type:MAC  Level of Consciousness: drowsy  Airway & Oxygen Therapy: Patient Spontanous Breathing and Patient connected to face mask oxygen  Post-op Assessment: Report given to RN and Post -op Vital signs reviewed and stable  Post vital signs: Reviewed and stable  Last Vitals:  Vitals Value Taken Time  BP 96/84 04/01/23 1351  Temp    Pulse 74 04/01/23 1354  Resp 25 04/01/23 1354  SpO2 95 % 04/01/23 1354  Vitals shown include unvalidated device data.  Last Pain:  Vitals:   04/01/23 1207  TempSrc: Oral  PainSc: 0-No pain      Patients Stated Pain Goal: 2 (04/01/23 1207)  Complications: No notable events documented.

## 2023-04-01 NOTE — Interval H&P Note (Signed)
History and Physical Interval Note:  04/01/2023 12:52 PM  [name]  has presented today for surgery, with the diagnosis of Left diabetic foot infection.  The various methods of treatment have been discussed with the patient and family. After consideration of risks, benefits and other options for treatment, the patient has consented to  Procedure(s) with comments: AMPUTATION FOOT (Left) - Surgical team to do local block as a surgical intervention.  The patient's history has been reviewed, patient examined, no change in status, stable for surgery.  I have reviewed the patient's chart and labs.  Questions were answered to the patient's satisfaction.     Louann Sjogren

## 2023-04-01 NOTE — Anesthesia Postprocedure Evaluation (Signed)
Anesthesia Post Note  Patient: [name]  Procedure(s) Performed: AMPUTATION LEFT FOREFOOT (Left: Foot)     Patient location during evaluation: PACU Anesthesia Type: MAC Level of consciousness: awake and alert, patient cooperative and oriented Pain management: pain level controlled Vital Signs Assessment: post-procedure vital signs reviewed and stable Respiratory status: spontaneous breathing, nonlabored ventilation and respiratory function stable Cardiovascular status: stable and blood pressure returned to baseline Postop Assessment: no apparent nausea or vomiting Anesthetic complications: no   No notable events documented.  Last Vitals:  Vitals:   04/01/23 1515 04/01/23 1539  BP: 103/73 105/69  Pulse: 75 77  Resp: (!) 24 16  Temp:  36.7 C  SpO2: 98% 99%    Last Pain:  Vitals:   04/01/23 1539  TempSrc: Oral  PainSc:                  Deziyah Arvin,E. Eriberto Felch

## 2023-04-01 NOTE — Hospital Course (Signed)
The patient is a 62 year old obese Caucasian male with a past medical history significant for benign to chronic systolic and diastolic CHF, diabetes mellitus type 2 which is now diet controlled and improved significantly, with history of recurrent diabetic foot wounds, essential hypertension, hyperlipidemia, anemia of chronic disease with baseline hemoglobin of 10-12 and other comorbidities who presents from the podiatry office for a left foot wound.  Over the last several weeks he is reported worsening of the wound on the dorsal surface of the left foot over the first toe and now after 7 to 10 days he had progressive erythema, swelling tenderness and warmth over the dorsal aspect of the left midfoot associated with some purulent drainage.  He denies any trauma and states that he is neuropathy and has really no feeling in his feet.  He presented to his podiatry office who is very concerned and sent the patient over to the ED for further evaluation and workup and he was admitted for his diabetic foot wound and podiatry and infectious diseases been consulted.  Given his MRI findings the infectious disease doctor recommended a TMA and patient was taken for TMA on 04/01/23 but podiatry feels that he may not heal properly and recommending probably definitive surgery with a BKA so Ortho was consulted to discuss BKA option versus repeat washout and potential closure.  Assessment and Plan:  Infected Left Diabetic Food Wound/Ulcer -Ulcerative lesion associated on the dorsal aspect of the left first toe with surrounding erythema, and tenderness warmth swelling and some redness.  No drainage with extension these findings approximately over the dorsal aspect of the left midfoot -Plain foot x-ray showed interval development of severe soft tissue swelling and absence of any evidence of subcutaneous gas -MRI of the foot done and showed "Small first MTP joint effusion with synovitis and subchondral marrow edema concerning for  septic arthritis. Severe bone marrow edema in the fifth metatarsal head and shaft concerning for osteomyelitis. Soft tissue edema along the medial aspect of the forefoot concerning for cellulitis. Complex fluid collection along the medial aspect of the first metatarsal measuring 1.5 x 1.8 x 2.7 cm consistent with an abscess. Small abscess along the medial aspect of the first metatarsal head measuring 1.4 x 0.2 cm. 10 mm abscess in the soft tissues between the first and second metatarsal heads  -Was placed on IV vancomycin and cefepime will continue as well as IV Flagyl -GI consulted and planning for TMA today -CBC and inflammatory markers trend: Recent Labs  Lab 03/31/23 1610 03/31/23 2048 03/31/23 2258 04/01/23 0200 04/02/23 0214  WBC 10.6*  --   --  5.1 4.4  LATICACIDVEN  --    < > 1.4  --   --   CRP  --   --  20.3*  --   --    < > = values in this interval not displayed.  -Podiatry consulted and took the patient for left transmetatarsal amputation with possible serial irrigation and debridement; Podiatry feels that due to the significant amount of bone was removed during surgery and appears to continue to have some necrosis cellulitis to his medial incision that he may need to go to the OR for repeat washout and potential closure however proximal amputation may be considered given his low likelihood of success for healing with constant surgery and wound care.  Will check ABIs and the podiatry team recommends an Ortho consult I spoke with Dr. Lajoyce Corners who will see the patient in the morning -Infectious Diseases consulted  for further evaluation recommendations and they are recommending continue broad-spectrum antibiotics for next 40 hours and discussing oral regimen to do afterwards pending culture studies -ID is also recommending ABIs for vascular studies and this has been ordered and pending  -Continue with pain control with IV Hydromorphone 0.5 mg every 2 as needed for severe pain, Acetaminophen  650 mg p.o./RC every 6 as needed for mild pain or fever -Weightbearing status per Podiatry and will need PT OT to further evaluate and treat  Anion Gap Metabolic Acidosis -In the setting of Foot Infection -Lactic Acid Level Trend: Recent Labs  Lab 03/31/23 2048 03/31/23 2258  LATICACIDVEN 1.0 1.4  -Had a CO2 of 17, anion gap of 16, chloride level 102; now CO2 is 22, anion gap is 11, chloride level is 102 -Continue To monitor and trend and repeat CMP in a.m.  Hypokalemia -Patient's K+ Level Trend: Recent Labs  Lab 03/31/23 1610 04/01/23 0200 04/02/23 0214  K 3.4* 3.6 3.7  -Replete with po Kcl 40 mEQ x1 -Continue to Monitor and Replete as Necessary -Repeat CMP in the AM   Hyponatremia Na+ Trend: Recent Labs  Lab 03/31/23 1610 04/01/23 0200 04/02/23 0214  NA 135 133* 135  -Continue to Monitor and Trend and repeat CMP in the AM  Chronic Combined Systolic and Diastolic CHF -Most Recent ECHO in January 2023 showed EF of 45-50% and Grade 3DD -Currently appears Euvolemic and no clinical evidence to suggest Acute Decompensation -Takes PRN Lasix at Home and Metoprolol Succinate -Strict I's and O's and Daily Weights;  Intake/Output Summary (Last 24 hours) at 04/02/2023 1434 Last data filed at 04/02/2023 1010 Gross per 24 hour  Intake 120 ml  Output --  Net 120 ml  -He is going to be n.p.o. and his beta-blocker has been held but will be resumed after surgical intervention -Continue to monitor for signs and symptoms of volume overload  Diabetes Mellitus Type 2, now Diet controlled -Well controlled and repeat HbA1c was 5.4 -Has a Hx of Diabetes Mellitus type 2 -Not on any Insulin or Oral Hypoglycemics as an outpatient  -Placed on q6h CBGs with Sensitive Novolog SSI  -Continue to Monitor CBG's per Protocol -CBG Trend:  Recent Labs  Lab 04/01/23 0613 04/01/23 1124 04/01/23 1352 04/01/23 1626 04/01/23 2345 04/02/23 0543 04/02/23 1111  GLUCAP 126* 159* 113* 107* 148*  147* 171*   Essential HTN -Home Meds include Metoprolol Succinate and Amlodipine which have been resumed -Continue to Monitor BP per Protocol -Last BP Reading was 132/76  HLD -On Atorvastatin as an outpatient and resumed now  Anemia of Chronic Disease -Has a Baseline Hgb ranging from 10-12 -Hgb/Hct Trend: Recent Labs  Lab 03/31/23 1610 04/01/23 0200 04/02/23 0214  HGB 12.2* 10.2* 9.4*  HCT 36.8* 30.7* 29.1*  MCV 90.6 89.8 91.8  -Checked Anemia Panel and showed an iron level of 31, UIBC of 260, TIBC of 291, saturation ratios of 11%, ferritin level 130, folate of 33.1, vitamin B12 level of 2029 -Continue to Monitor for S/Sx of Bleeding; No overt bleeding noted -Repeat CBC in the AM   Hypoalbuminemia -Patient's Albumin Trend: Recent Labs  Lab 03/31/23 1610 04/01/23 0200 04/02/23 0214  ALBUMIN 2.9* 2.3* 2.4*  -Continue to Monitor and Trend and repeat CMP in the AM  Obesity -Complicates overall prognosis and care -Estimated body mass index is 37.76 kg/m as calculated from the following:   Height as of this encounter: 6' (1.829 m).   Weight as of this encounter: 126.3 kg.  -  Weight Loss and Dietary Counseling given

## 2023-04-01 NOTE — Anesthesia Procedure Notes (Signed)
Procedure Name: MAC Date/Time: 04/01/2023 1:08 PM  Performed by: Annabelle Harman A, CRNAPre-anesthesia Checklist: Patient identified, Emergency Drugs available, Suction available and Patient being monitored Patient Re-evaluated:Patient Re-evaluated prior to induction Oxygen Delivery Method: Simple face mask Induction Type: IV induction Placement Confirmation: positive ETCO2 Dental Injury: Teeth and Oropharynx as per pre-operative assessment

## 2023-04-01 NOTE — Progress Notes (Signed)
PROGRESS NOTE    [name]  UEA:[id number] DOB: 12/07/1960 DOA: 03/31/2023 PCP: [name], MD   Brief Narrative:  The patient is a 62 year old obese Caucasian male with a past medical history significant for benign to chronic systolic and diastolic CHF, diabetes mellitus type 2 which is now diet controlled and improved significantly, with history of recurrent diabetic foot wounds, essential hypertension, hyperlipidemia, anemia of chronic disease with baseline hemoglobin of 10-12 and other comorbidities who presents from the podiatry office for a left foot wound.  Over the last several weeks he is reported worsening of the wound on the dorsal surface of the left foot over the first toe and now after 7 to 10 days he had progressive erythema, swelling tenderness and warmth over the dorsal aspect of the left midfoot associated with some purulent drainage.  He denies any trauma and states that he is neuropathy and has really no feeling in his feet.  He presented to his podiatry office who is very concerned and sent the patient over to the ED for further evaluation and workup and he was admitted for his diabetic foot wound and podiatry and infectious diseases been consulted.  Given his MRI findings the infectious disease doctor recommended a TMA and patient wanted a TMA later today.  Assessment and Plan:  Infected Left Diabetic Food Wound/Ulcer -Ulcerative lesion associated on the dorsal aspect of the left first toe with surrounding erythema, and tenderness warmth swelling and some redness.  No drainage with extension these findings approximately over the dorsal aspect of the left midfoot -Plain foot x-ray showed interval development of severe soft tissue swelling and absence of any evidence of subcutaneous gas -MRI of the foot done and showed "Small first MTP joint effusion with synovitis and subchondral marrow edema concerning for septic arthritis. Severe bone marrow edema in the fifth metatarsal  head and shaft concerning for osteomyelitis. Soft tissue edema along the medial aspect of the forefoot concerning for cellulitis. Complex fluid collection along the medial aspect of the first metatarsal measuring 1.5 x 1.8 x 2.7 cm consistent with an abscess. Small abscess along the medial aspect of the first metatarsal head measuring 1.4 x 0.2 cm. 10 mm abscess in the soft tissues between the first and second metatarsal heads  -Was placed on IV vancomycin and cefepime will continue as well as IV Flagyl -GI consulted and planning for TMA today -CBC and inflammatory markers trend: Recent Labs  Lab 03/31/23 1610 03/31/23 2048 03/31/23 2258 04/01/23 0200  WBC 10.6*  --   --  5.1  LATICACIDVEN  --    < > 1.4  --   CRP  --   --  20.3*  --    < > = values in this interval not displayed.  -Podiatry consulted and took the patient for left transmetatarsal amputation with possible serial irrigation and debridement -Infectious Diseases consulted for further evaluation recommendations and they are recommending continue broad-spectrum antibiotics for next 40 hours and discussing oral regimen to do afterwards pending culture studies -ID is also recommending ABIs for vascular studies and this has been ordered -Continue with pain control with IV hydromorphone 0.5 mg every 2 as needed for severe pain, acetaminophen 650 mg p.o./RC every 6 as needed for mild pain or fever -Weightbearing status per Podiatry and will need PT OT to further evaluate and treat  Anion Gap Metabolic Acidosis -In the setting of Foot Infection -Lactic Acid Level Trend: Recent Labs  Lab 03/31/23 2048 03/31/23 2258  LATICACIDVEN 1.0 1.4  -Had a CO2 of 17, anion gap of 16, chloride level 102; now CO2 is 20, anion gap is 10, chloride level is 103 -Continue To monitor and trend and repeat CMP in a.m.  Hypokalemia -Patient's K+ Level Trend: Recent Labs  Lab 03/31/23 1610 04/01/23 0200  K 3.4* 3.6  -Replete with po Kcl 40 mEQ  x1 -Continue to Monitor and Replete as Necessary -Repeat CMP in the AM   Hyponatremia Na+ Trend: Recent Labs  Lab 03/31/23 1610 04/01/23 0200  NA 135 133*  -Continue to Monitor and Trend and repeat CMP in the AM  Chronic Combined Systolic and Diastolic CHF -Most Recent ECHO in January 2023 showed EF of 45-50% and Grade 3DD -Currently appears Euvolemic and no clinical evidence to suggest Acute Decompensation -Takes PRN Lasix at Home and Metoprolol Succinate -Strict I's and O's and Daily Weights;  Intake/Output Summary (Last 24 hours) at 04/01/2023 1446 Last data filed at 04/01/2023 1347 Gross per 24 hour  Intake 840 ml  Output --  Net 840 ml  -He is going to be n.p.o. and his beta-blocker has been held but will be resumed after surgical intervention -Continue to monitor for signs and symptoms of volume overload  Diabetes Mellitus Type 2, now Diet controlled -Well controlled and repeat HbA1c was 5.4 -Has a Hx of Diabetes Mellitus type 2 -Not on any Insulin or Oral Hypoglycemics as an outpatient  -Placed on q6h CBGs with Sensitive Novolog SSI  -Continue to Monitor CBG's per Protocol -CBG Trend:  Recent Labs  Lab 04/01/23 0020 04/01/23 0613 04/01/23 1124 04/01/23 1352  GLUCAP 167* 126* 159* 113*   Essential HTN -Home Meds include Metoprolol Succinate and Amlodipine -Continue to Monitor BP per Protocol -Last BP Reading was on the softer side at 103/63  HLD -On Atorvastatin as an outpatient -NPO for Surgical Intervention and can resume Statin afterwards  Anemia of Chronic Disease -Has a Baseline Hgb ranging from 10-12 -Hgb/Hct Trend: Recent Labs  Lab 03/31/23 1610 04/01/23 0200  HGB 12.2* 10.2*  HCT 36.8* 30.7*  MCV 90.6 89.8  -Check Anemia Panel in the AM -Continue to Monitor for S/Sx of Bleeding; No overt bleeding noted -Repeat CBC in the AM   Hypoalbuminemia -Patient's Albumin Trend: Recent Labs  Lab 03/31/23 1610 04/01/23 0200  ALBUMIN 2.9* 2.3*   -Continue to Monitor and Trend and repeat CMP in the AM  Obesity -Complicates overall prognosis and care -Estimated body mass index is 37.76 kg/m as calculated from the following:   Height as of this encounter: 6' (1.829 m).   Weight as of this encounter: 126.3 kg.  -Weight Loss and Dietary Counseling given   DVT prophylaxis: SCD's Start: 04/01/23 0019 SCDs Start: 03/31/23 2000    Code Status: Full Code Family Communication: No family present at bedside  Disposition Plan:  Level of care: Telemetry Medical Status is: Inpatient Remains inpatient appropriate because: Undergoing Left TMA this Afternoon   Consultants:  Podiatry Infectious Diseases  Procedures:  As delineated as above  Antimicrobials:  Anti-infectives (From admission, onward)    Start     Dose/Rate Route Frequency Ordered Stop   04/01/23 1000  [MAR Hold]  vancomycin (VANCOCIN) IVPB 1000 mg/200 mL premix        (MAR Hold since Fri 04/01/2023 at 1147.Hold Reason: Transfer to a Procedural area)   1,000 mg 200 mL/hr over 60 Minutes Intravenous Every 12 hours 03/31/23 2029     03/31/23 2015  vancomycin (VANCOREADY) IVPB 2000  mg/400 mL        2,000 mg 200 mL/hr over 120 Minutes Intravenous  Once 03/31/23 2008 04/01/23 0412   03/31/23 2015  [MAR Hold]  ceFEPIme (MAXIPIME) 2 g in sodium chloride 0.9 % 100 mL IVPB        (MAR Hold since Fri 04/01/2023 at 1147.Hold Reason: Transfer to a Procedural area)   2 g 200 mL/hr over 30 Minutes Intravenous Every 8 hours 03/31/23 2008     03/31/23 2015  [MAR Hold]  metroNIDAZOLE (FLAGYL) IVPB 500 mg        (MAR Hold since Fri 04/01/2023 at 1147.Hold Reason: Transfer to a Procedural area)   500 mg 100 mL/hr over 60 Minutes Intravenous Every 12 hours 03/31/23 2008     03/31/23 1945  vancomycin (VANCOCIN) IVPB 1000 mg/200 mL premix  Status:  Discontinued        1,000 mg 200 mL/hr over 60 Minutes Intravenous  Once 03/31/23 1933 03/31/23 2008   03/31/23 1945  ceFEPIme (MAXIPIME) 2 g in  sodium chloride 0.9 % 100 mL IVPB  Status:  Discontinued        2 g 200 mL/hr over 30 Minutes Intravenous  Once 03/31/23 1933 03/31/23 2008       Subjective: Seen and examined at bedside he is very anxious this morning.  States that he does not really have any pain but his foot is significantly worse.  No nausea or vomiting.  Denies lightheadedness or dizziness.  Objective: Vitals:   04/01/23 1352 04/01/23 1400 04/01/23 1415 04/01/23 1450  BP: 96/84 129/79 133/87 103/63  Pulse: 94 78 76 84  Resp: (!) 25 (!) 23 18 (!) 24  Temp: 98 F (36.7 C)  98 F (36.7 C)   TempSrc:      SpO2: 96% 93% 99% 98%  Weight:      Height:        Intake/Output Summary (Last 24 hours) at 04/01/2023 1458 Last data filed at 04/01/2023 1347 Gross per 24 hour  Intake 840 ml  Output --  Net 840 ml   Filed Weights   03/31/23 1603  Weight: 126.3 kg   Examination: Physical Exam:  Constitutional: WN/WD obese Caucasian male who appears extremely anxious Respiratory: Diminished to auscultation bilaterally, no wheezing, rales, rhonchi or crackles. Normal respiratory effort and patient is not tachypenic. No accessory muscle use.  Unlabored breathing Cardiovascular: RRR, no murmurs / rubs / gallops. S1 and S2 auscultated.  Has some mild lower extremity edema worse on the left compared to right Abdomen: Soft, non-tender, distended [place] to body habitus. Bowel sounds positive.  GU: Deferred. Musculoskeletal: Left foot is wrapped Skin: No rashes, skin evaluation and skin appears warm and dry Neurologic: CN 2-12 grossly intact with no focal deficits. Romberg sign and cerebellar reflexes not assessed.  Psychiatric: Normal judgment and insight. Alert and oriented x 3.  Anxious mood and affect  Data Reviewed: I have personally reviewed following labs and imaging studies  CBC: Recent Labs  Lab 03/31/23 1610 04/01/23 0200  WBC 10.6* 5.1  NEUTROABS 8.8* 3.9  HGB 12.2* 10.2*  HCT 36.8* 30.7*  MCV 90.6 89.8   PLT 233 172   Basic Metabolic Panel: Recent Labs  Lab 03/31/23 1610 03/31/23 2258 04/01/23 0200  NA 135  --  133*  K 3.4*  --  3.6  CL 102  --  103  CO2 17*  --  20*  GLUCOSE 106*  --  174*  BUN 20  --  23  CREATININE 1.00  --  0.99  CALCIUM 9.2  --  8.9  MG  --  2.1 1.9   GFR: Estimated Creatinine Clearance: 107.6 mL/min (by C-G formula based on SCr of 0.99 mg/dL). Liver Function Tests: Recent Labs  Lab 03/31/23 1610 04/01/23 0200  AST 33 26  ALT 44 34  ALKPHOS 177* 153*  BILITOT 1.2 0.9  PROT 8.3* 6.7  ALBUMIN 2.9* 2.3*   No results for input(s): "LIPASE", "AMYLASE" in the last 168 hours. No results for input(s): "AMMONIA" in the last 168 hours. Coagulation Profile: Recent Labs  Lab 04/01/23 0200  INR 1.2   Cardiac Enzymes: Recent Labs  Lab 03/31/23 2258  CKTOTAL 43*   BNP (last 3 results) No results for input(s): "PROBNP" in the last 8760 hours. HbA1C: Recent Labs    03/31/23 2258  HGBA1C 5.4   CBG: Recent Labs  Lab 04/01/23 0020 04/01/23 0613 04/01/23 1124 04/01/23 1352  GLUCAP 167* 126* 159* 113*   Lipid Profile: No results for input(s): "CHOL", "HDL", "LDLCALC", "TRIG", "CHOLHDL", "LDLDIRECT" in the last 72 hours. Thyroid Function Tests: No results for input(s): "TSH", "T4TOTAL", "FREET4", "T3FREE", "THYROIDAB" in the last 72 hours. Anemia Panel: No results for input(s): "VITAMINB12", "FOLATE", "FERRITIN", "TIBC", "IRON", "RETICCTPCT" in the last 72 hours. Sepsis Labs: Recent Labs  Lab 03/31/23 2048 03/31/23 2258  LATICACIDVEN 1.0 1.4    Recent Results (from the past 240 hour(s))  Blood culture (routine x 2)     Status: None (Preliminary result)   Collection Time: 03/31/23  8:48 PM   Specimen: BLOOD  Result Value Ref Range Status   Specimen Description BLOOD BLOOD LEFT FOREARM  Final   Special Requests   Final    BOTTLES DRAWN AEROBIC AND ANAEROBIC Blood Culture adequate volume   Culture   Final    NO GROWTH < 12  HOURS Performed at [facility], [address] [place], [place] [address]    Report Status PENDING  Incomplete  Blood culture (routine x 2)     Status: None (Preliminary result)   Collection Time: 03/31/23 10:58 PM   Specimen: BLOOD RIGHT ARM  Result Value Ref Range Status   Specimen Description BLOOD RIGHT ARM  Final   Special Requests   Final    BOTTLES DRAWN AEROBIC AND ANAEROBIC Blood Culture adequate volume   Culture   Final    NO GROWTH < 12 HOURS Performed at [facility], [address], [place], [place] [address]    Report Status PENDING  Incomplete  Surgical pcr screen     Status: Abnormal   Collection Time: 04/01/23 12:40 AM   Specimen: Nasal Mucosa; Nasal Swab  Result Value Ref Range Status   MRSA, PCR POSITIVE (A) NEGATIVE Final    Comment: RESULT CALLED TO, READ BACK BY AND VERIFIED WITH: A. PETTIFORD RN 04/01/23 @ 0223 BY AB    Staphylococcus aureus POSITIVE (A) NEGATIVE Final    Comment: (NOTE) The Xpert SA Assay (FDA approved for NASAL specimens in patients 16 years of age and older), is one component of a comprehensive surveillance program. It is not intended to diagnose infection nor to guide or monitor treatment. Performed at [facility], [address], [place], [place] [address]      Radiology Studies: MR FOOT LEFT W WO CONTRAST  Result Date: 04/01/2023 CLINICAL DATA:  Concern for infection of the foot. EXAM: MRI OF THE LEFT FOREFOOT WITHOUT AND WITH CONTRAST TECHNIQUE:  Multiplanar, multisequence MR imaging of the left foot was performed both before and after administration of intravenous contrast. CONTRAST:  10mL GADAVIST GADOBUTROL 1 MMOL/ML IV SOLN COMPARISON:  03/31/2023 FINDINGS: Patient motion degrades image quality limiting evaluation. Bones/Joint/Cartilage No fracture or dislocation. Hallux valgus.  No joint effusion. Small first MTP joint effusion with synovitis and subchondral marrow edema concerning for septic arthritis.  Severe bone marrow edema in the fifth metatarsal head and shaft concerning for osteomyelitis. Ligaments Collateral ligaments are intact.  Lisfranc ligament is intact. Muscles and Tendons Flexor, peroneal and extensor compartment tendons are intact. Generalized muscle atrophy. Soft tissue Soft tissue edema along the medial aspect of the forefoot concerning for cellulitis. Complex fluid collection along the medial aspect of the first metatarsal measuring 1.5 x 1.8 x 2.7 cm consistent with an abscess. Small abscess along the medial aspect of the first metatarsal head measuring 1.4 x 0.2 cm. 10 mm abscess in the soft tissues between the first and second metatarsal heads. Soft tissue edema along the dorsal aspect of the foot consistent with cellulitis. Soft tissue wound partially visualized along the plantar lateral aspect of the midfoot. No soft tissue mass. IMPRESSION: 1. Small first MTP joint effusion with synovitis and subchondral marrow edema concerning for septic arthritis. 2. Severe bone marrow edema in the fifth metatarsal head and shaft concerning for osteomyelitis. 3. Soft tissue edema along the medial aspect of the forefoot concerning for cellulitis. Complex fluid collection along the medial aspect of the first metatarsal measuring 1.5 x 1.8 x 2.7 cm consistent with an abscess. Small abscess along the medial aspect of the first metatarsal head measuring 1.4 x 0.2 cm. 10 mm abscess in the soft tissues between the first and second metatarsal heads. Electronically Signed   By: Elige Ko M.D.   On: 04/01/2023 07:51   DG Foot Complete Left  Result Date: 03/31/2023 CLINICAL DATA:  Swelling.  Pain.  Ulcer EXAM: LEFT FOOT - COMPLETE 3 VIEW COMPARISON:  X-ray 11/23/2022 FINDINGS: Severe soft tissue swelling about the midfoot which is new. There is a new fracture involving the neck of the fifth metatarsal since the prior examination. Severe osteopenia. Hallux valgus deformity of the first ray with hypertrophic  degenerative changes similar to previous. Degenerative changes of the midfoot with flatfoot deformity. Large plantar calcaneal spur and mild Achilles spur. Degenerative changes of the ankle and hindfoot as well. Severe osteopenia. No definite erosive changes at this time. If there is concern further of bone infection, MRI or bone scan could be considered as clinically appropriate. Stable changes of previous amputation involving the digits of third ray in the distal aspect of the third metatarsal. IMPRESSION: Development of severe soft tissue swelling about the midfoot. No underlying bony erosive changes at this time. Flatfoot deformity with degenerative changes and hallux valgus deformity of the first ray. Since the prior there is a fracture of the neck of fifth metatarsal. Please correlate with time course of injury. Electronically Signed   By: Karen Kays M.D.   On: 03/31/2023 16:40    Scheduled Meds:  [MAR Hold] Chlorhexidine Gluconate Cloth  6 each Topical Q0600   [MAR Hold] insulin aspart  0-9 Units Subcutaneous Q6H   [MAR Hold] mupirocin ointment  1 Application Nasal BID   Continuous Infusions:  [MAR Hold] ceFEPime (MAXIPIME) IV 2 g (04/01/23 0643)   lactated ringers 10 mL/hr at 04/01/23 1302   [MAR Hold] metronidazole 500 mg (04/01/23 0843)   [MAR Hold] vancomycin 1,000 mg (04/01/23 1015)  LOS: 1 day   Marguerita Merles, DO [facility] Available via Epic secure chat 7am-7pm After these hours, please refer to coverage provider listed on [email or web address] 04/01/2023, 2:58 PM

## 2023-04-01 NOTE — Anesthesia Preprocedure Evaluation (Addendum)
Anesthesia Evaluation  Patient identified by MRN, date of birth, ID band Patient awake    Reviewed: Allergy & Precautions, NPO status , Patient's Chart, lab work & pertinent test results  History of Anesthesia Complications (+) PONV  Airway Mallampati: II  TM Distance: >3 FB Neck ROM: Full    Dental  (+) Dental Advisory Given   Pulmonary sleep apnea and Continuous Positive Airway Pressure Ventilation , former smoker   breath sounds clear to auscultation       Cardiovascular hypertension, Pt. on medications and Pt. on home beta blockers (-) angina + CAD (mild, non-obstructive) and + Peripheral Vascular Disease   Rhythm:Regular Rate:Normal  '23 ECHO: EF 45 to 50%. 1. The LV has mildly decreased function, regional wall motion abnormalities. Grade III diastolic dysfunction (restrictive). There is severe hypokinesis of the left ventricular, mid lateral wall.   2. RVF is normal. The right ventricular size is normal.   3. Left atrial size was mildly dilated.   4. Right atrial size was mildly dilated.   5. The mitral valve is normal in structure. Trivial MR.   6. The aortic valve was not well visualized. There is mild calcification  of the aortic valve. Aortic valve regurgitation is not visualized.   7. Evidence of atrial level shunting detected by color flow Doppler.   '23 cath: mild, non-obstructive disease   Neuro/Psych   Anxiety Depression    Chronic back pain    GI/Hepatic Neg liver ROS,GERD  Controlled,,H/o ETOH dependence   Endo/Other  diabetes (glu 159, no longer on metformin)  Morbid obesity  Renal/GU negative Renal ROS     Musculoskeletal   Abdominal   Peds  Hematology  (+) Blood dyscrasia (Hb 10.2), anemia   Anesthesia Other Findings   Reproductive/Obstetrics                              Anesthesia Physical Anesthesia Plan  ASA: 3  Anesthesia Plan: MAC   Post-op Pain  Management: Minimal or no pain anticipated   Induction:   PONV Risk Score and Plan: 2 and Treatment may vary due to age or medical condition and Ondansetron  Airway Management Planned: Natural Airway and Simple Face Mask  Additional Equipment:   Intra-op Plan:   Post-operative Plan:   Informed Consent: I have reviewed the patients History and Physical, chart, labs and discussed the procedure including the risks, benefits and alternatives for the proposed anesthesia with the patient or authorized representative who has indicated his/her understanding and acceptance.     Dental advisory given  Plan Discussed with: CRNA and Surgeon  Anesthesia Plan Comments:          Anesthesia Quick Evaluation
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
## 2023-04-01 NOTE — Progress Notes (Signed)
Orthopedic Tech Progress Note Patient Details:  [name] [birth year] [id number]  Patient ID: [name], male   DOB: [birth year], 62 y.o.   MRN: [id number] CAM walker not applied at this time, as the family states they have a variety of shoes at home and will be bringing them in the morning for her husband. Attempted to apply a CAM walker, however only an XL would fit the patients leg, but it was for too big in the foot area.  Darleen Crocker 04/01/2023, 7:16 PM

## 2023-04-01 NOTE — Brief Op Note (Signed)
03/31/2023 - 04/01/2023  12:52 PM  PATIENT:  [name]  62 y.o. male  PRE-OPERATIVE DIAGNOSIS:  Left diabetic foot infection, osteomyelitis   POST-OPERATIVE DIAGNOSIS:  Left diabetic foot infection, ostelyelitis   PROCEDURE:  Procedure(s) with comments: AMPUTATION FOOT (Left) - Surgical team to do local block  SURGEON:  Surgeon(s) and Role:    * Louann Sjogren, DPM - Primary  PHYSICIAN ASSISTANT:   ASSISTANTS: none   ANESTHESIA:   local and MAC  EBL:  100 ml    BLOOD ADMINISTERED:none  DRAINS: none   LOCAL MEDICATIONS USED:  MARCAINE   , LIDOCAINE , and Amount: 20 ml  SPECIMEN:  Source of Specimen:  Left forefoot for pathology, residual fifth metatarsal for culture.   DISPOSITION OF SPECIMEN:  PATHOLOGY and culture   COUNTS:  YES  TOURNIQUET:  * No tourniquets in log *  DICTATION: .Note written in EPIC  PLAN OF CARE: Admit to inpatient   PATIENT DISPOSITION:  PACU - hemodynamically stable.   Delay start of Pharmacological VTE agent (>24hrs) due to surgical blood loss or risk of bleeding: no

## 2023-04-02 ENCOUNTER — Encounter (HOSPITAL_COMMUNITY): Payer: Medicare Other

## 2023-04-02 ENCOUNTER — Encounter (HOSPITAL_COMMUNITY): Payer: Self-pay | Admitting: Podiatry

## 2023-04-02 DIAGNOSIS — E119 Type 2 diabetes mellitus without complications: Secondary | ICD-10-CM | POA: Diagnosis not present

## 2023-04-02 DIAGNOSIS — S91332A Puncture wound without foreign body, left foot, initial encounter: Secondary | ICD-10-CM | POA: Diagnosis not present

## 2023-04-02 DIAGNOSIS — I5042 Chronic combined systolic (congestive) and diastolic (congestive) heart failure: Secondary | ICD-10-CM | POA: Diagnosis not present

## 2023-04-02 DIAGNOSIS — L089 Local infection of the skin and subcutaneous tissue, unspecified: Secondary | ICD-10-CM | POA: Diagnosis not present

## 2023-04-02 LAB — COMPREHENSIVE METABOLIC PANEL
ALT: 34 U/L (ref 0–44)
AST: 29 U/L (ref 15–41)
Albumin: 2.4 g/dL — ABNORMAL LOW (ref 3.5–5.0)
Alkaline Phosphatase: 152 U/L — ABNORMAL HIGH (ref 38–126)
Anion gap: 11 (ref 5–15)
BUN: 20 mg/dL (ref 8–23)
CO2: 22 mmol/L (ref 22–32)
Calcium: 8.8 mg/dL — ABNORMAL LOW (ref 8.9–10.3)
Chloride: 102 mmol/L (ref 98–111)
Creatinine, Ser: 0.82 mg/dL (ref 0.61–1.24)
GFR, Estimated: >60 mL/min (ref >60)
Glucose, Bld: 139 mg/dL — ABNORMAL HIGH (ref 70–99)
Potassium: 3.7 mmol/L (ref 3.5–5.1)
Sodium: 135 mmol/L (ref 135–145)
Total Bilirubin: 0.7 mg/dL (ref 0.3–1.2)
Total Protein: 7 g/dL (ref 6.5–8.1)

## 2023-04-02 LAB — CBC WITH DIFFERENTIAL/PLATELET
Abs Immature Granulocytes: 0.06 10*3/uL (ref 0.00–0.07)
Basophils Absolute: 0 10*3/uL (ref 0.0–0.1)
Basophils Relative: 1 %
Eosinophils Absolute: 0.1 10*3/uL (ref 0.0–0.5)
Eosinophils Relative: 1 %
HCT: 29.1 % — ABNORMAL LOW (ref 39.0–52.0)
Hemoglobin: 9.4 g/dL — ABNORMAL LOW (ref 13.0–17.0)
Immature Granulocytes: 1 %
Lymphocytes Relative: 25 %
Lymphs Abs: 1.1 10*3/uL (ref 0.7–4.0)
MCH: 29.7 pg (ref 26.0–34.0)
MCHC: 32.3 g/dL (ref 30.0–36.0)
MCV: 91.8 fL (ref 80.0–100.0)
Monocytes Absolute: 0.5 10*3/uL (ref 0.1–1.0)
Monocytes Relative: 12 %
Neutro Abs: 2.7 10*3/uL (ref 1.7–7.7)
Neutrophils Relative %: 60 %
Platelets: 188 10*3/uL (ref 150–400)
RBC: 3.17 MIL/uL — ABNORMAL LOW (ref 4.22–5.81)
RDW: 14.8 % (ref 11.5–15.5)
WBC: 4.4 10*3/uL (ref 4.0–10.5)
nRBC: 0 % (ref 0.0–0.2)

## 2023-04-02 LAB — IRON AND TIBC
Iron: 31 ug/dL — ABNORMAL LOW (ref 45–182)
Saturation Ratios: 11 % — ABNORMAL LOW (ref 17.9–39.5)
TIBC: 291 ug/dL (ref 250–450)
UIBC: 260 ug/dL

## 2023-04-02 LAB — RETICULOCYTES
Immature Retic Fract: 10.8 % (ref 2.3–15.9)
RBC.: 3.1 MIL/uL — ABNORMAL LOW (ref 4.22–5.81)
Retic Count, Absolute: 53.3 10*3/uL (ref 19.0–186.0)
Retic Ct Pct: 1.7 % (ref 0.4–3.1)

## 2023-04-02 LAB — FERRITIN: Ferritin: 138 ng/mL (ref 24–336)

## 2023-04-02 LAB — GLUCOSE, CAPILLARY
Glucose-Capillary: 147 mg/dL — ABNORMAL HIGH (ref 70–99)
Glucose-Capillary: 167 mg/dL — ABNORMAL HIGH (ref 70–99)
Glucose-Capillary: 169 mg/dL — ABNORMAL HIGH (ref 70–99)
Glucose-Capillary: 171 mg/dL — ABNORMAL HIGH (ref 70–99)

## 2023-04-02 LAB — MAGNESIUM: Magnesium: 1.8 mg/dL (ref 1.7–2.4)

## 2023-04-02 LAB — PHOSPHORUS: Phosphorus: 4.1 mg/dL (ref 2.5–4.6)

## 2023-04-02 LAB — VITAMIN B12: Vitamin B-12: 2029 pg/mL — ABNORMAL HIGH (ref 180–914)

## 2023-04-02 LAB — CULTURE, BLOOD (ROUTINE X 2): Culture: NO GROWTH

## 2023-04-02 LAB — FOLATE: Folate: 33.1 ng/mL (ref >5.9)

## 2023-04-02 MED ORDER — NITROGLYCERIN 0.4 MG SL SUBL: 0.4000 mg | SUBLINGUAL_TABLET | SUBLINGUAL | Status: DC | PRN

## 2023-04-02 MED ORDER — FOLIC ACID 1 MG PO TABS
1.0000 mg | ORAL_TABLET | Freq: Every day | ORAL | Status: DC
  Administered 2023-04-02 – 2023-04-08 (×7): 1 mg via ORAL
  Filled 2023-04-02 (×7): qty 1

## 2023-04-02 MED ORDER — METOPROLOL SUCCINATE ER 25 MG PO TB24
25.0000 mg | ORAL_TABLET | Freq: Every day | ORAL | Status: DC
  Administered 2023-04-02 – 2023-04-08 (×7): 25 mg via ORAL
  Filled 2023-04-02 (×7): qty 1

## 2023-04-02 MED ORDER — ATORVASTATIN CALCIUM 80 MG PO TABS
80.0000 mg | ORAL_TABLET | Freq: Every day | ORAL | Status: DC
  Administered 2023-04-02 – 2023-04-07 (×6): 80 mg via ORAL
  Filled 2023-04-02 (×6): qty 1

## 2023-04-02 MED ORDER — AMLODIPINE BESYLATE 5 MG PO TABS
5.0000 mg | ORAL_TABLET | Freq: Every day | ORAL | Status: DC
  Administered 2023-04-02 – 2023-04-08 (×7): 5 mg via ORAL
  Filled 2023-04-02 (×7): qty 1

## 2023-04-02 MED ORDER — THIAMINE MONONITRATE 100 MG PO TABS
100.0000 mg | ORAL_TABLET | Freq: Every day | ORAL | Status: DC
  Administered 2023-04-02 – 2023-04-08 (×7): 100 mg via ORAL
  Filled 2023-04-02 (×9): qty 1

## 2023-04-02 NOTE — Evaluation (Signed)
Physical Therapy Evaluation Patient Details Name: [name] MRN: [id number] DOB: December 06, 1960 Today's Date: 04/02/2023  History of Present Illness  62 y.o. male admitted 6/13 with Infected Left Diabetic Foot Wound/Ulcer. S/p left transmetatarsal amputation 6/14. PMHx:  Sepsis  L diabetic foot infection/ulcer, possible early osteomyelitis, septic joint arthritis with infected tenosynovitis, HTN, obesity, HLD, prediabetes, alcoholic cirrhosis, alcohol abuse.  Clinical Impression  Patient is s/p above surgery resulting in functional limitations due to the deficits listed below (see PT Problem List). Currently mobilizing at a min guard level, easily fatigues with RW use, maintaining NWB on LLE. Pt expresses likelihood of agreeing to Lt BKA. If this occurs would anticipate pt being a good candidate for CIR. Will continue to follow and progress during admission.Patient will benefit from acute skilled PT to increase their independence and safety with mobility to facilitate discharge.        Recommendations for follow up therapy are one component of a multi-disciplinary discharge planning process, led by the attending physician.  Recommendations may be updated based on patient status, additional functional criteria and insurance authorization.     Assistance Recommended at Discharge Intermittent Supervision/Assistance  Patient can return home with the following  A little help with walking and/or transfers;A little help with bathing/dressing/bathroom;Assistance with cooking/housework;Assist for transportation;Help with stairs or ramp for entrance    Equipment Recommendations None recommended by PT  Recommendations for Other Services  Rehab consult    Functional Status Assessment Patient has had a recent decline in their functional status and demonstrates the ability to make significant improvements in function in a reasonable and predictable amount of time.     Precautions / Restrictions  Precautions Precautions: Fall Required Braces or Orthoses: Other Brace Other Brace: Cam ordered but didn't fit. Has a large post-op shoe that does fit better. Restrictions Weight Bearing Restrictions: Yes LLE Weight Bearing: Non weight bearing      Mobility  Bed Mobility               General bed mobility comments: in recliner    Transfers Overall transfer level: Needs assistance Equipment used: Rolling walker (2 wheels) Transfers: Sit to/from Stand Sit to Stand: Min guard           General transfer comment: min guard for safety to rise from recliner with cues for hand placement and to maintain NWB on LLE.    Ambulation/Gait Ambulation/Gait assistance: Min guard, +2 safety/equipment Gait Distance (Feet): 20 Feet Assistive device: Rolling walker (2 wheels) Gait Pattern/deviations:  (hop) Gait velocity: slow Gait velocity interpretation: <1.31 ft/sec, indicative of household ambulator   General Gait Details: Educated on safe AD use with RW for support, cues for technique and sequencing. Safely maintains WB precautions on LLE. UEs fatigue quickly. +2 for chair follow/safety. No buckling noted. Educated on smoother transitions and walker placement.  Stairs            Wheelchair Mobility    Modified Rankin (Stroke Patients Only)       Balance Overall balance assessment: Needs assistance Sitting-balance support: No upper extremity supported, Feet supported Sitting balance-Leahy Scale: Good     Standing balance support: Bilateral upper extremity supported Standing balance-Leahy Scale: Poor                               Pertinent Vitals/Pain Pain Assessment Pain Assessment: 0-10 Pain Score: 0-No pain Pain Descriptors / Indicators: Numbness Pain Intervention(s): Monitored during session, Repositioned  Home Living Family/patient expects to be discharged to:: Private residence Living Arrangements: Spouse/significant other Available  Help at Discharge: Family;Available 24 hours/day Type of Home: House Home Access: Stairs to enter Entrance Stairs-Rails: Right;Left;Can reach both Entrance Stairs-Number of Steps: 3   Home Layout: Two level;Able to live on main level with bedroom/bathroom Home Equipment: Rolling Walker (2 wheels);Rollator (4 wheels);Cane - single point;BSC/3in1;Shower seat - built in;Wheelchair - manual      Prior Function Prior Level of Function : Needs assist             Mobility Comments: Has been using cane/walker depending on status of left foot.       Hand Dominance   Dominant Hand: Right    Extremity/Trunk Assessment   Upper Extremity Assessment Upper Extremity Assessment: Defer to OT evaluation    Lower Extremity Assessment Lower Extremity Assessment: LLE deficits/detail LLE Deficits / Details: bandaged       Communication   Communication: No difficulties  Cognition Arousal/Alertness: Awake/alert Behavior During Therapy: WFL for tasks assessed/performed Overall Cognitive Status: Within Functional Limits for tasks assessed                                          General Comments General comments (skin integrity, edema, etc.): Educated on LE elevation, precautions, dispo planning.    Exercises General Exercises - Lower Extremity Ankle Circles/Pumps: AROM, Both, 10 reps, Seated Quad Sets: Strengthening, Both, 10 reps, Seated Gluteal Sets: Strengthening, Both, 10 reps, Seated   Assessment/Plan    PT Assessment Patient needs continued PT services  PT Problem List Decreased strength;Decreased activity tolerance;Decreased balance;Decreased mobility;Decreased knowledge of use of DME;Decreased knowledge of precautions;Impaired sensation;Obesity;Pain       PT Treatment Interventions DME instruction;Gait training;Stair training;Functional mobility training;Therapeutic activities;Therapeutic exercise;Balance training;Neuromuscular re-education;Patient/family  education;Wheelchair mobility training;Modalities    PT Goals (Current goals can be found in the Care Plan section)  Acute Rehab PT Goals Patient Stated Goal: Get well PT Goal Formulation: With patient Time For Goal Achievement: 04/16/23 Potential to Achieve Goals: Good    Frequency Min 4X/week     Co-evaluation               AM-PAC PT "6 Clicks" Mobility  Outcome Measure Help needed turning from your back to your side while in a flat bed without using bedrails?: A Little Help needed moving from lying on your back to sitting on the side of a flat bed without using bedrails?: A Little Help needed moving to and from a bed to a chair (including a wheelchair)?: A Little Help needed standing up from a chair using your arms (e.g., wheelchair or bedside chair)?: A Little Help needed to walk in hospital room?: A Little Help needed climbing 3-5 steps with a railing? : A Lot 6 Click Score: 17    End of Session Equipment Utilized During Treatment: Gait belt Activity Tolerance: Patient tolerated treatment well Patient left: in chair;with call bell/phone within reach;with chair alarm set;with family/visitor present Nurse Communication: Mobility status PT Visit Diagnosis: Unsteadiness on feet (R26.81);Other abnormalities of gait and mobility (R26.89);Muscle weakness (generalized) (M62.81);Difficulty in walking, not elsewhere classified (R26.2)    Time: 8295-6213 PT Time Calculation (min) (ACUTE ONLY): 23 min   Charges:   PT Evaluation $PT Eval Low Complexity: 1 Low PT Treatments $Therapeutic Activity: 8-22 mins        Kathlyn Sacramento, PT, DPT [facility]  Rehabilitation Services Physical Therapist Office: [phone] Website: [email or web address]   Berton Mount 04/02/2023, 4:38 PM
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
## 2023-04-02 NOTE — Plan of Care (Signed)
  Problem: Metabolic: Goal: Ability to maintain appropriate glucose levels will improve Outcome: Progressing   Problem: Activity: Goal: Risk for activity intolerance will decrease Outcome: Progressing   Problem: Coping: Goal: Level of anxiety will decrease Outcome: Progressing   Problem: Safety: Goal: Ability to remain free from injury will improve Outcome: Progressing

## 2023-04-02 NOTE — Evaluation (Signed)
Occupational Therapy Evaluation Patient Details Name: [name] MRN: [id number] DOB: [birth year] Today's Date: 04/02/2023   History of Present Illness 62 y.o. male admitted 6/13 with Infected Left Diabetic Foot Wound/Ulcer. S/p left transmetatarsal amputation 6/14. PMHx:  Sepsis  L diabetic foot infection/ulcer, possible early osteomyelitis, septic joint arthritis with infected tenosynovitis, HTN, obesity, HLD, prediabetes, alcoholic cirrhosis, alcohol abuse.   Clinical Impression   Pt admitted for above dx, PTA patient lived with spouse and used a Youth worker for ambulation and reports being independent in bADLs. Pt currently presenting with impaired balance due to NWB LLE status which also limits his activity tolerance. Pt needing min guard assist for transfers and mobility, but requires frequent cueing to lift LLE off ground when ambulating, pt reports he can't even tell due to his baseline peripheral neuropathy. Educated pt to position hips at sink when grooming to provide external support to manage balance. Also educated pt to continue with AROM of his anke, knee, and hips to maintain joint integrity, pt verbalized understanding. Advised pt to use BSC upon DC to provide support with STS. Pt did fatigue and catch a RLE cramp during ambulation and had moments where he positioned LLE on the ground with a coin sized bleed patch noted at amputation site, RN/NT made aware. Pt would benefit from continued Acue skilled OT services to address above deficits and help transition to next level of care. Patient has the potential to reach Mod I and demos the ability to tolerate 3 hours of therapy. Pt would benefit from an intensive rehab program to help maximize functional independence.      Recommendations for follow up therapy are one component of a multi-disciplinary discharge planning process, led by the attending physician.  Recommendations may be updated based on patient status, additional functional  criteria and insurance authorization.   Assistance Recommended at Discharge Intermittent Supervision/Assistance  Patient can return home with the following A little help with walking and/or transfers;A little help with bathing/dressing/bathroom;Assistance with cooking/housework    Functional Status Assessment  Patient has had a recent decline in their functional status and demonstrates the ability to make significant improvements in function in a reasonable and predictable amount of time.  Equipment Recommendations  None recommended by OT (Pt has rec DME)    Recommendations for Other Services Rehab consult     Precautions / Restrictions Precautions Precautions: Fall Required Braces or Orthoses: Other Brace Other Brace: Cam ordered but didn't fit. Has a large post-op shoe that does fit better. Restrictions Weight Bearing Restrictions: Yes LLE Weight Bearing: Non weight bearing      Mobility Bed Mobility Overal bed mobility: Needs Assistance Bed Mobility: Sit to Supine       Sit to supine: Supervision   General bed mobility comments: in recliner upon entry    Transfers Overall transfer level: Needs assistance Equipment used: Rolling walker (2 wheels) Transfers: Sit to/from Stand Sit to Stand: Min guard                  Balance Overall balance assessment: Needs assistance Sitting-balance support: No upper extremity supported, Feet supported Sitting balance-Leahy Scale: Good     Standing balance support: Bilateral upper extremity supported Standing balance-Leahy Scale: Poor Standing balance comment: needs RW support                           ADL either performed or assessed with clinical judgement   ADL Overall ADL's : Needs assistance/impaired  Eating/Feeding: Independent;Sitting   Grooming: Standing;Minimal assistance Grooming Details (indicate cue type and reason): cues for pt to position hips at sink when standing to support balance with NWB  LLE Upper Body Bathing: Independent;Sitting   Lower Body Bathing: Sitting/lateral leans;Supervison/ safety;Set up   Upper Body Dressing : Sitting;Independent   Lower Body Dressing: Sitting/lateral leans;Set up;Supervision/safety   Toilet Transfer: Min guard;Rolling walker (2 wheels);Ambulation   Toileting- Clothing Manipulation and Hygiene: Min guard;Sitting/lateral lean       Functional mobility during ADLs: Min guard;Rolling walker (2 wheels) General ADL Comments: Pt needing consistent cueing to keep LLE off ground to maintain NWB status.     Vision         Perception     Praxis      Pertinent Vitals/Pain Pain Assessment Pain Assessment: No/denies pain Pain Score: 0-No pain Pain Descriptors / Indicators: Numbness Pain Intervention(s): Monitored during session, Repositioned     Hand Dominance Right   Extremity/Trunk Assessment Upper Extremity Assessment Upper Extremity Assessment: Overall WFL for tasks assessed   Lower Extremity Assessment Lower Extremity Assessment: LLE deficits/detail LLE Deficits / Details: bandaged. Peripheral neuropathy at baseline       Communication Communication Communication: No difficulties   Cognition Arousal/Alertness: Awake/alert Behavior During Therapy: WFL for tasks assessed/performed Overall Cognitive Status: Within Functional Limits for tasks assessed                                       General Comments  VSS on RA    Exercises     Shoulder Instructions      Home Living Family/patient expects to be discharged to:: Private residence Living Arrangements: Spouse/significant other Available Help at Discharge: Family;Available 24 hours/day Type of Home: House Home Access: Stairs to enter [employer] of Steps: 3 Entrance Stairs-Rails: Right;Left;Can reach both Home Layout: Two level;Able to live on main level with bedroom/bathroom     Bathroom Shower/Tub: Higher education careers adviser: Handicapped height Bathroom Accessibility: Yes   Home Equipment: Agricultural consultant (2 wheels);Rollator (4 wheels);Cane - single point;BSC/3in1;Shower seat - built in;Wheelchair - manual          Prior Functioning/Environment Prior Level of Function : Needs assist             Mobility Comments: Has been using cane/walker depending on status of left foot. ADLs Comments: ind bathing and dressing        OT Problem List: Decreased activity tolerance;Impaired balance (sitting and/or standing);Pain      OT Treatment/Interventions: Self-care/ADL training;Therapeutic activities;Therapeutic exercise;Patient/family education;Balance training    OT Goals(Current goals can be found in the care plan section) Acute Rehab OT Goals Patient Stated Goal: To get better OT Goal Formulation: With patient/family Time For Goal Achievement: 04/16/23 Potential to Achieve Goals: Good ADL Goals Pt Will Perform Grooming: standing;with supervision Pt Will Perform Lower Body Bathing: sitting/lateral leans;with supervision;with set-up Pt Will Transfer to Toilet: ambulating;with supervision Pt Will Perform Tub/Shower Transfer: Shower transfer;with supervision;shower seat  OT Frequency: Min 3X/week    Co-evaluation              AM-PAC OT "6 Clicks" Daily Activity     Outcome Measure Help from another person eating meals?: None Help from another person taking care of personal grooming?: A Little Help from another person toileting, which includes using toliet, bedpan, or urinal?: A Little Help from another person bathing (including washing,  rinsing, drying)?: A Little Help from another person to put on and taking off regular upper body clothing?: None Help from another person to put on and taking off regular lower body clothing?: A Little 6 Click Score: 20   End of Session Equipment Utilized During Treatment: Gait belt;Rolling walker (2 wheels) Nurse Communication: Mobility  status  Activity Tolerance: Patient tolerated treatment well Patient left: in bed;with call bell/phone within reach;with bed alarm set;with family/visitor present  OT Visit Diagnosis: Unsteadiness on feet (R26.81);Pain Pain - Right/Left: Left Pain - part of body: Ankle and joints of foot                Time: 1610-9604 OT Time Calculation (min): 19 min Charges:  OT General Charges $OT Visit: 1 Visit OT Evaluation $OT Eval Moderate Complexity: 1 Mod  04/02/2023  AB, OTR/L  Acute Rehabilitation Services  Office: [phone]   Tristan Schroeder 04/02/2023, 4:56 PM

## 2023-04-02 NOTE — Progress Notes (Signed)
PROGRESS NOTE    [name]  ZHY:[id number] DOB: 11-Jul-1961 DOA: 03/31/2023 PCP: [name], MD   Brief Narrative:  The patient is a 62 year old obese Caucasian male with a past medical history significant for benign to chronic systolic and diastolic CHF, diabetes mellitus type 2 which is now diet controlled and improved significantly, with history of recurrent diabetic foot wounds, essential hypertension, hyperlipidemia, anemia of chronic disease with baseline hemoglobin of 10-12 and other comorbidities who presents from the podiatry office for a left foot wound.  Over the last several weeks he is reported worsening of the wound on the dorsal surface of the left foot over the first toe and now after 7 to 10 days he had progressive erythema, swelling tenderness and warmth over the dorsal aspect of the left midfoot associated with some purulent drainage.  He denies any trauma and states that he is neuropathy and has really no feeling in his feet.  He presented to his podiatry office who is very concerned and sent the patient over to the ED for further evaluation and workup and he was admitted for his diabetic foot wound and podiatry and infectious diseases been consulted.  Given his MRI findings the infectious disease doctor recommended a TMA and patient was taken for TMA on 04/01/23 but podiatry feels that he may not heal properly and recommending probably definitive surgery with a BKA so Ortho was consulted to discuss BKA option versus repeat washout and potential closure.  Assessment and Plan:  Infected Left Diabetic Food Wound/Ulcer -Ulcerative lesion associated on the dorsal aspect of the left first toe with surrounding erythema, and tenderness warmth swelling and some redness.  No drainage with extension these findings approximately over the dorsal aspect of the left midfoot -Plain foot x-ray showed interval development of severe soft tissue swelling and absence of any evidence of subcutaneous  gas -MRI of the foot done and showed "Small first MTP joint effusion with synovitis and subchondral marrow edema concerning for septic arthritis. Severe bone marrow edema in the fifth metatarsal head and shaft concerning for osteomyelitis. Soft tissue edema along the medial aspect of the forefoot concerning for cellulitis. Complex fluid collection along the medial aspect of the first metatarsal measuring 1.5 x 1.8 x 2.7 cm consistent with an abscess. Small abscess along the medial aspect of the first metatarsal head measuring 1.4 x 0.2 cm. 10 mm abscess in the soft tissues between the first and second metatarsal heads  -Was placed on IV vancomycin and cefepime will continue as well as IV Flagyl -GI consulted and planning for TMA today -CBC and inflammatory markers trend: Recent Labs  Lab 03/31/23 1610 03/31/23 2048 03/31/23 2258 04/01/23 0200 04/02/23 0214  WBC 10.6*  --   --  5.1 4.4  LATICACIDVEN  --    < > 1.4  --   --   CRP  --   --  20.3*  --   --    < > = values in this interval not displayed.  -Podiatry consulted and took the patient for left transmetatarsal amputation with possible serial irrigation and debridement; Podiatry feels that due to the significant amount of bone was removed during surgery and appears to continue to have some necrosis cellulitis to his medial incision that he may need to go to the OR for repeat washout and potential closure however proximal amputation may be considered given his low likelihood of success for healing with constant surgery and wound care.  Will check ABIs and  the podiatry team recommends an Ortho consult I spoke with Dr. Lajoyce Corners who will see the patient in the morning -Infectious Diseases consulted for further evaluation recommendations and they are recommending continue broad-spectrum antibiotics for next 40 hours and discussing oral regimen to do afterwards pending culture studies -ID is also recommending ABIs for vascular studies and this has been  ordered and pending  -Continue with pain control with IV Hydromorphone 0.5 mg every 2 as needed for severe pain, Acetaminophen 650 mg p.o./RC every 6 as needed for mild pain or fever -Weightbearing status per Podiatry and will need PT OT to further evaluate and treat  Anion Gap Metabolic Acidosis -In the setting of Foot Infection -Lactic Acid Level Trend: Recent Labs  Lab 03/31/23 2048 03/31/23 2258  LATICACIDVEN 1.0 1.4  -Had a CO2 of 17, anion gap of 16, chloride level 102; now CO2 is 22, anion gap is 11, chloride level is 102 -Continue To monitor and trend and repeat CMP in a.m.  Hypokalemia -Patient's K+ Level Trend: Recent Labs  Lab 03/31/23 1610 04/01/23 0200 04/02/23 0214  K 3.4* 3.6 3.7  -Replete with po Kcl 40 mEQ x1 -Continue to Monitor and Replete as Necessary -Repeat CMP in the AM   Hyponatremia Na+ Trend: Recent Labs  Lab 03/31/23 1610 04/01/23 0200 04/02/23 0214  NA 135 133* 135  -Continue to Monitor and Trend and repeat CMP in the AM  Chronic Combined Systolic and Diastolic CHF -Most Recent ECHO in January 2023 showed EF of 45-50% and Grade 3DD -Currently appears Euvolemic and no clinical evidence to suggest Acute Decompensation -Takes PRN Lasix at Home and Metoprolol Succinate -Strict I's and O's and Daily Weights;  Intake/Output Summary (Last 24 hours) at 04/02/2023 1434 Last data filed at 04/02/2023 1010 Gross per 24 hour  Intake 120 ml  Output --  Net 120 ml  -He is going to be n.p.o. and his beta-blocker has been held but will be resumed after surgical intervention -Continue to monitor for signs and symptoms of volume overload  Diabetes Mellitus Type 2, now Diet controlled -Well controlled and repeat HbA1c was 5.4 -Has a Hx of Diabetes Mellitus type 2 -Not on any Insulin or Oral Hypoglycemics as an outpatient  -Placed on q6h CBGs with Sensitive Novolog SSI  -Continue to Monitor CBG's per Protocol -CBG Trend:  Recent Labs  Lab 04/01/23 0613  04/01/23 1124 04/01/23 1352 04/01/23 1626 04/01/23 2345 04/02/23 0543 04/02/23 1111  GLUCAP 126* 159* 113* 107* 148* 147* 171*   Essential HTN -Home Meds include Metoprolol Succinate and Amlodipine which have been resumed -Continue to Monitor BP per Protocol -Last BP Reading was 132/76  HLD -On Atorvastatin as an outpatient and resumed now  Anemia of Chronic Disease -Has a Baseline Hgb ranging from 10-12 -Hgb/Hct Trend: Recent Labs  Lab 03/31/23 1610 04/01/23 0200 04/02/23 0214  HGB 12.2* 10.2* 9.4*  HCT 36.8* 30.7* 29.1*  MCV 90.6 89.8 91.8  -Checked Anemia Panel and showed an iron level of 31, UIBC of 260, TIBC of 291, saturation ratios of 11%, ferritin level 130, folate of 33.1, vitamin B12 level of 2029 -Continue to Monitor for S/Sx of Bleeding; No overt bleeding noted -Repeat CBC in the AM   Hypoalbuminemia -Patient's Albumin Trend: Recent Labs  Lab 03/31/23 1610 04/01/23 0200 04/02/23 0214  ALBUMIN 2.9* 2.3* 2.4*  -Continue to Monitor and Trend and repeat CMP in the AM  Obesity -Complicates overall prognosis and care -Estimated body mass index is 37.76 kg/m as  calculated from the following:   Height as of this encounter: 6' (1.829 m).   Weight as of this encounter: 126.3 kg.  -Weight Loss and Dietary Counseling given   DVT prophylaxis: SCDs Start: 03/31/23 2000    Code Status: Full Code Family Communication: No family present at bedside  Disposition Plan:  Level of care: Telemetry Medical Status is: Inpatient Remains inpatient appropriate because: May go back to the OR tomorrow for another washout versus BKA but patient did not decide about further options with shared decision making with the surgeons.   Consultants:  Podiatry Dr. Ralene Cork Infectious Diseases Dr. Drue Second Orthopedic Surgery Dr. Lajoyce Corners  Procedures:  Procedure:  1. Left transmetatarsal amputation possible serial irrigation and debrideiment  Antimicrobials:  Anti-infectives (From  admission, onward)    Start     Dose/Rate Route Frequency Ordered Stop   04/01/23 1000  vancomycin (VANCOCIN) IVPB 1000 mg/200 mL premix        1,000 mg 200 mL/hr over 60 Minutes Intravenous Every 12 hours 03/31/23 2029     03/31/23 2015  vancomycin (VANCOREADY) IVPB 2000 mg/400 mL        2,000 mg 200 mL/hr over 120 Minutes Intravenous  Once 03/31/23 2008 04/01/23 0412   03/31/23 2015  ceFEPIme (MAXIPIME) 2 g in sodium chloride 0.9 % 100 mL IVPB        2 g 200 mL/hr over 30 Minutes Intravenous Every 8 hours 03/31/23 2008     03/31/23 2015  metroNIDAZOLE (FLAGYL) IVPB 500 mg        500 mg 100 mL/hr over 60 Minutes Intravenous Every 12 hours 03/31/23 2008     03/31/23 1945  vancomycin (VANCOCIN) IVPB 1000 mg/200 mL premix  Status:  Discontinued        1,000 mg 200 mL/hr over 60 Minutes Intravenous  Once 03/31/23 1933 03/31/23 2008   03/31/23 1945  ceFEPIme (MAXIPIME) 2 g in sodium chloride 0.9 % 100 mL IVPB  Status:  Discontinued        2 g 200 mL/hr over 30 Minutes Intravenous  Once 03/31/23 1933 03/31/23 2008       Subjective: Seen and examined and was contemplating what to do next with either going for a washout or getting a definitive surgery with BKA.  He denies any nausea or vomiting and feels no pain in his foot.  Denies any other concerns or complaints at this time.  Objective: Vitals:   04/01/23 2114 04/01/23 2348 04/02/23 0339 04/02/23 1012  BP: 111/77 124/69 135/69 132/76  Pulse: 92 75 77 74  Resp:  18  16  Temp: 98.3 F (36.8 C) 98.6 F (37 C) 97.7 F (36.5 C) 98.7 F (37.1 C)  TempSrc: Oral Oral Oral Oral  SpO2: 94% 96% 93% 97%  Weight:      Height:        Intake/Output Summary (Last 24 hours) at 04/02/2023 1517 Last data filed at 04/02/2023 1010 Gross per 24 hour  Intake 120 ml  Output --  Net 120 ml   Filed Weights   03/31/23 1603  Weight: 126.3 kg   Examination: Physical Exam:  Constitutional: WN/WD obese Caucasian male who appears a little  anxious Respiratory: Diminished to auscultation bilaterally with coarse breath sounds, no wheezing, rales, rhonchi or crackles. Normal respiratory effort and patient is not tachypenic. No accessory muscle use. Unlabored breathing  Cardiovascular: RRR, no murmurs / rubs / gallops. S1 and S2 auscultated. Has Left foot wrapped Abdomen: Soft, non-tender, Distended 2/2  body habitus. Bowel sounds positive.  GU: Deferred. Musculoskeletal: No clubbing / cyanosis of digits/nails. No joint deformity upper and lower extremities.  Skin: Left foot is wrapped Neurologic: CN 2-12 grossly intact with no focal deficits. Romberg sign and cerebellar reflexes not assessed.  Psychiatric: Normal judgment and insight. Alert and oriented x 3. Appears a little anxious  Data Reviewed: I have personally reviewed following labs and imaging studies  CBC: Recent Labs  Lab 03/31/23 1610 04/01/23 0200 04/02/23 0214  WBC 10.6* 5.1 4.4  NEUTROABS 8.8* 3.9 2.7  HGB 12.2* 10.2* 9.4*  HCT 36.8* 30.7* 29.1*  MCV 90.6 89.8 91.8  PLT 233 172 188   Basic Metabolic Panel: Recent Labs  Lab 03/31/23 1610 03/31/23 2258 04/01/23 0200 04/02/23 0214  NA 135  --  133* 135  K 3.4*  --  3.6 3.7  CL 102  --  103 102  CO2 17*  --  20* 22  GLUCOSE 106*  --  174* 139*  BUN 20  --  23 20  CREATININE 1.00  --  0.99 0.82  CALCIUM 9.2  --  8.9 8.8*  MG  --  2.1 1.9 1.8  PHOS  --   --   --  4.1   GFR: Estimated Creatinine Clearance: 129.9 mL/min (by C-G formula based on SCr of 0.82 mg/dL). Liver Function Tests: Recent Labs  Lab 03/31/23 1610 04/01/23 0200 04/02/23 0214  AST 33 26 29  ALT 44 34 34  ALKPHOS 177* 153* 152*  BILITOT 1.2 0.9 0.7  PROT 8.3* 6.7 7.0  ALBUMIN 2.9* 2.3* 2.4*   No results for input(s): "LIPASE", "AMYLASE" in the last 168 hours. No results for input(s): "AMMONIA" in the last 168 hours. Coagulation Profile: Recent Labs  Lab 04/01/23 0200  INR 1.2   Cardiac Enzymes: Recent Labs  Lab  03/31/23 2258  CKTOTAL 43*   BNP (last 3 results) No results for input(s): "PROBNP" in the last 8760 hours. HbA1C: Recent Labs    03/31/23 2258  HGBA1C 5.4   CBG: Recent Labs  Lab 04/01/23 1352 04/01/23 1626 04/01/23 2345 04/02/23 0543 04/02/23 1111  GLUCAP 113* 107* 148* 147* 171*   Lipid Profile: No results for input(s): "CHOL", "HDL", "LDLCALC", "TRIG", "CHOLHDL", "LDLDIRECT" in the last 72 hours. Thyroid Function Tests: No results for input(s): "TSH", "T4TOTAL", "FREET4", "T3FREE", "THYROIDAB" in the last 72 hours. Anemia Panel: Recent Labs    04/02/23 0214  VITAMINB12 2,029*  FOLATE 33.1  FERRITIN 138  TIBC 291  IRON 31*  RETICCTPCT 1.7   Sepsis Labs: Recent Labs  Lab 03/31/23 2048 03/31/23 2258  LATICACIDVEN 1.0 1.4   Recent Results (from the past 240 hour(s))  Blood culture (routine x 2)     Status: None (Preliminary result)   Collection Time: 03/31/23  8:48 PM   Specimen: BLOOD  Result Value Ref Range Status   Specimen Description BLOOD BLOOD LEFT FOREARM  Final   Special Requests   Final    BOTTLES DRAWN AEROBIC AND ANAEROBIC Blood Culture adequate volume   Culture   Final    NO GROWTH 2 DAYS Performed at [facility], [address] [place], [place] [address]    Report Status PENDING  Incomplete  Blood culture (routine x 2)     Status: None (Preliminary result)   Collection Time: 03/31/23 10:58 PM   Specimen: BLOOD RIGHT ARM  Result Value Ref Range Status   Specimen Description BLOOD RIGHT ARM  Final  Special Requests   Final    BOTTLES DRAWN AEROBIC AND ANAEROBIC Blood Culture adequate volume   Culture   Final    NO GROWTH 2 DAYS Performed at [facility], [address], [place], [place] [address]    Report Status PENDING  Incomplete  Surgical pcr screen     Status: Abnormal   Collection Time: 04/01/23 12:40 AM   Specimen: Nasal Mucosa; Nasal Swab  Result Value Ref Range Status   MRSA, PCR POSITIVE (A) NEGATIVE Final     Comment: RESULT CALLED TO, READ BACK BY AND VERIFIED WITH: A. PETTIFORD RN 04/01/23 @ 0223 BY AB    Staphylococcus aureus POSITIVE (A) NEGATIVE Final    Comment: (NOTE) The Xpert SA Assay (FDA approved for NASAL specimens in patients 29 years of age and older), is one component of a comprehensive surveillance program. It is not intended to diagnose infection nor to guide or monitor treatment. Performed at [facility], [address] [place], [place] [address]   Aerobic/Anaerobic Culture w Gram Stain (surgical/deep wound)     Status: None (Preliminary result)   Collection Time: 04/01/23  1:37 PM   Specimen: Path Tissue  Result Value Ref Range Status   Specimen Description WOUND  Final   Special Requests RESIDUAL FIRST METATARSEL  Final   Gram Stain NO WBC SEEN NO ORGANISMS SEEN   Final   Culture   Final    CULTURE REINCUBATED FOR BETTER GROWTH Performed at [facility], [address] [place], [place] [address]    Report Status PENDING  Incomplete    Radiology Studies: DG Foot Complete Left  Result Date: 04/01/2023 CLINICAL DATA:  Osteomyelitis of left foot EXAM: LEFT FOOT - COMPLETE 3+ VIEW COMPARISON:  03/31/2023 FINDINGS: Status post transmetatarsal amputation of all 5 left toes. No visible complicating feature. No acute bony abnormality within the remaining bony structures. Plantar calcaneal spur. IMPRESSION: Postoperative changes from amputation of all 5 toes at the transmetatarsal level. No complicating feature. Electronically Signed   By: Charlett Nose M.D.   On: 04/01/2023 19:37   MR FOOT LEFT W WO CONTRAST  Result Date: 04/01/2023 CLINICAL DATA:  Concern for infection of the foot. EXAM: MRI OF THE LEFT FOREFOOT WITHOUT AND WITH CONTRAST TECHNIQUE: Multiplanar, multisequence MR imaging of the left foot was performed both before and after administration of intravenous contrast. CONTRAST:  10mL GADAVIST GADOBUTROL 1 MMOL/ML IV SOLN COMPARISON:  03/31/2023  FINDINGS: Patient motion degrades image quality limiting evaluation. Bones/Joint/Cartilage No fracture or dislocation. Hallux valgus.  No joint effusion. Small first MTP joint effusion with synovitis and subchondral marrow edema concerning for septic arthritis. Severe bone marrow edema in the fifth metatarsal head and shaft concerning for osteomyelitis. Ligaments Collateral ligaments are intact.  Lisfranc ligament is intact. Muscles and Tendons Flexor, peroneal and extensor compartment tendons are intact. Generalized muscle atrophy. Soft tissue Soft tissue edema along the medial aspect of the forefoot concerning for cellulitis. Complex fluid collection along the medial aspect of the first metatarsal measuring 1.5 x 1.8 x 2.7 cm consistent with an abscess. Small abscess along the medial aspect of the first metatarsal head measuring 1.4 x 0.2 cm. 10 mm abscess in the soft tissues between the first and second metatarsal heads. Soft tissue edema along the dorsal aspect of the foot consistent with cellulitis. Soft tissue wound partially visualized along the plantar lateral aspect of the midfoot. No soft tissue mass. IMPRESSION: 1. Small first MTP joint  effusion with synovitis and subchondral marrow edema concerning for septic arthritis. 2. Severe bone marrow edema in the fifth metatarsal head and shaft concerning for osteomyelitis. 3. Soft tissue edema along the medial aspect of the forefoot concerning for cellulitis. Complex fluid collection along the medial aspect of the first metatarsal measuring 1.5 x 1.8 x 2.7 cm consistent with an abscess. Small abscess along the medial aspect of the first metatarsal head measuring 1.4 x 0.2 cm. 10 mm abscess in the soft tissues between the first and second metatarsal heads. Electronically Signed   By: Elige Ko M.D.   On: 04/01/2023 07:51   DG Foot Complete Left  Result Date: 03/31/2023 CLINICAL DATA:  Swelling.  Pain.  Ulcer EXAM: LEFT FOOT - COMPLETE 3 VIEW COMPARISON:   X-ray 11/23/2022 FINDINGS: Severe soft tissue swelling about the midfoot which is new. There is a new fracture involving the neck of the fifth metatarsal since the prior examination. Severe osteopenia. Hallux valgus deformity of the first ray with hypertrophic degenerative changes similar to previous. Degenerative changes of the midfoot with flatfoot deformity. Large plantar calcaneal spur and mild Achilles spur. Degenerative changes of the ankle and hindfoot as well. Severe osteopenia. No definite erosive changes at this time. If there is concern further of bone infection, MRI or bone scan could be considered as clinically appropriate. Stable changes of previous amputation involving the digits of third ray in the distal aspect of the third metatarsal. IMPRESSION: Development of severe soft tissue swelling about the midfoot. No underlying bony erosive changes at this time. Flatfoot deformity with degenerative changes and hallux valgus deformity of the first ray. Since the prior there is a fracture of the neck of fifth metatarsal. Please correlate with time course of injury. Electronically Signed   By: Karen Kays M.D.   On: 03/31/2023 16:40    Scheduled Meds:  amLODipine  5 mg Oral Daily   atorvastatin  80 mg Oral Daily   Chlorhexidine Gluconate Cloth  6 each Topical Q0600   folic acid  1 mg Oral Daily   insulin aspart  0-9 Units Subcutaneous Q6H   metoprolol succinate  25 mg Oral Daily   mupirocin ointment  1 Application Nasal BID   thiamine  100 mg Oral Daily   Continuous Infusions:  ceFEPime (MAXIPIME) IV 2 g (04/02/23 1230)   metronidazole 500 mg (04/02/23 0744)   vancomycin 1,000 mg (04/02/23 0916)    LOS: 2 days   Marguerita Merles, DO [facility] Available via Epic secure chat 7am-7pm After these hours, please refer to coverage provider listed on [email or web address] 04/02/2023, 3:17 PM

## 2023-04-03 DIAGNOSIS — E43 Unspecified severe protein-calorie malnutrition: Secondary | ICD-10-CM

## 2023-04-03 DIAGNOSIS — I5042 Chronic combined systolic (congestive) and diastolic (congestive) heart failure: Secondary | ICD-10-CM | POA: Diagnosis not present

## 2023-04-03 DIAGNOSIS — E1151 Type 2 diabetes mellitus with diabetic peripheral angiopathy without gangrene: Secondary | ICD-10-CM | POA: Diagnosis not present

## 2023-04-03 DIAGNOSIS — M86472 Chronic osteomyelitis with draining sinus, left ankle and foot: Secondary | ICD-10-CM

## 2023-04-03 DIAGNOSIS — S91332A Puncture wound without foreign body, left foot, initial encounter: Secondary | ICD-10-CM | POA: Diagnosis not present

## 2023-04-03 DIAGNOSIS — L089 Local infection of the skin and subcutaneous tissue, unspecified: Secondary | ICD-10-CM | POA: Diagnosis not present

## 2023-04-03 DIAGNOSIS — E119 Type 2 diabetes mellitus without complications: Secondary | ICD-10-CM | POA: Diagnosis not present

## 2023-04-03 LAB — COMPREHENSIVE METABOLIC PANEL
ALT: 30 U/L (ref 0–44)
AST: 24 U/L (ref 15–41)
Albumin: 2 g/dL — ABNORMAL LOW (ref 3.5–5.0)
Alkaline Phosphatase: 143 U/L — ABNORMAL HIGH (ref 38–126)
Anion gap: 8 (ref 5–15)
BUN: 18 mg/dL (ref 8–23)
CO2: 20 mmol/L — ABNORMAL LOW (ref 22–32)
Calcium: 8.3 mg/dL — ABNORMAL LOW (ref 8.9–10.3)
Chloride: 102 mmol/L (ref 98–111)
Creatinine, Ser: 0.74 mg/dL (ref 0.61–1.24)
GFR, Estimated: >60 mL/min (ref >60)
Glucose, Bld: 162 mg/dL — ABNORMAL HIGH (ref 70–99)
Potassium: 3.4 mmol/L — ABNORMAL LOW (ref 3.5–5.1)
Sodium: 130 mmol/L — ABNORMAL LOW (ref 135–145)
Total Bilirubin: 0.6 mg/dL (ref 0.3–1.2)
Total Protein: 6.1 g/dL — ABNORMAL LOW (ref 6.5–8.1)

## 2023-04-03 LAB — CBC WITH DIFFERENTIAL/PLATELET
Abs Immature Granulocytes: 0.04 10*3/uL (ref 0.00–0.07)
Basophils Absolute: 0 10*3/uL (ref 0.0–0.1)
Basophils Relative: 1 %
Eosinophils Absolute: 0.1 10*3/uL (ref 0.0–0.5)
Eosinophils Relative: 1 %
HCT: 25.1 % — ABNORMAL LOW (ref 39.0–52.0)
Hemoglobin: 8.2 g/dL — ABNORMAL LOW (ref 13.0–17.0)
Immature Granulocytes: 1 %
Lymphocytes Relative: 28 %
Lymphs Abs: 1.2 10*3/uL (ref 0.7–4.0)
MCH: 29.6 pg (ref 26.0–34.0)
MCHC: 32.7 g/dL (ref 30.0–36.0)
MCV: 90.6 fL (ref 80.0–100.0)
Monocytes Absolute: 0.4 10*3/uL (ref 0.1–1.0)
Monocytes Relative: 9 %
Neutro Abs: 2.6 10*3/uL (ref 1.7–7.7)
Neutrophils Relative %: 60 %
Platelets: 163 10*3/uL (ref 150–400)
RBC: 2.77 MIL/uL — ABNORMAL LOW (ref 4.22–5.81)
RDW: 14.6 % (ref 11.5–15.5)
WBC: 4.4 10*3/uL (ref 4.0–10.5)
nRBC: 0 % (ref 0.0–0.2)

## 2023-04-03 LAB — PREPARE RBC (CROSSMATCH)

## 2023-04-03 LAB — PHOSPHORUS: Phosphorus: 4.2 mg/dL (ref 2.5–4.6)

## 2023-04-03 LAB — TYPE AND SCREEN
ABO/RH(D): B POS
Unit division: 0

## 2023-04-03 LAB — GLUCOSE, CAPILLARY
Glucose-Capillary: 196 mg/dL — ABNORMAL HIGH (ref 70–99)
Glucose-Capillary: 108 mg/dL — ABNORMAL HIGH (ref 70–99)
Glucose-Capillary: 117 mg/dL — ABNORMAL HIGH (ref 70–99)

## 2023-04-03 LAB — BPAM RBC
Blood Product Expiration Date: 202407032359
ISSUE DATE / TIME: 202406161404

## 2023-04-03 LAB — URIC ACID: Uric Acid, Serum: 5.1 mg/dL (ref 3.7–8.6)

## 2023-04-03 LAB — MAGNESIUM: Magnesium: 1.6 mg/dL — ABNORMAL LOW (ref 1.7–2.4)

## 2023-04-03 LAB — HEMOGLOBIN AND HEMATOCRIT, BLOOD
HCT: 28.8 % — ABNORMAL LOW (ref 39.0–52.0)
Hemoglobin: 9.5 g/dL — ABNORMAL LOW (ref 13.0–17.0)

## 2023-04-03 LAB — CULTURE, BLOOD (ROUTINE X 2): Special Requests: ADEQUATE

## 2023-04-03 MED ORDER — POTASSIUM CHLORIDE CRYS ER 20 MEQ PO TBCR
40.0000 meq | EXTENDED_RELEASE_TABLET | Freq: Two times a day (BID) | ORAL | Status: AC
  Administered 2023-04-03 (×2): 40 meq via ORAL
  Filled 2023-04-03 (×2): qty 2

## 2023-04-03 MED ORDER — POLYETHYLENE GLYCOL 3350 17 G PO PACK
17.0000 g | PACK | Freq: Every day | ORAL | Status: DC | PRN
  Administered 2023-04-03 – 2023-04-08 (×3): 17 g via ORAL
  Filled 2023-04-03 (×2): qty 1

## 2023-04-03 MED ORDER — SENNOSIDES-DOCUSATE SODIUM 8.6-50 MG PO TABS: 1.0000 | ORAL_TABLET | Freq: Every evening | ORAL | Status: DC | PRN

## 2023-04-03 MED ORDER — VANCOMYCIN HCL 1500 MG/300ML IV SOLN
1500.0000 mg | Freq: Two times a day (BID) | INTRAVENOUS | Status: AC
  Administered 2023-04-03 – 2023-04-07 (×8): 1500 mg via INTRAVENOUS
  Filled 2023-04-03 (×8): qty 300

## 2023-04-03 MED ORDER — SODIUM CHLORIDE 0.9% IV SOLUTION: Freq: Once | INTRAVENOUS | Status: AC

## 2023-04-03 MED ORDER — MAGNESIUM SULFATE 2 GM/50ML IV SOLN
2.0000 g | Freq: Once | INTRAVENOUS | Status: AC
  Administered 2023-04-03: 2 g via INTRAVENOUS
  Filled 2023-04-03: qty 50

## 2023-04-03 NOTE — Progress Notes (Signed)
PROGRESS NOTE    [name]  ZOX:[id number] DOB: 1961-08-16 DOA: 03/31/2023 PCP: [name], MD   Brief Narrative:  The patient is a 62 year old obese Caucasian male with a past medical history significant for benign to chronic systolic and diastolic CHF, diabetes mellitus type 2 which is now diet controlled and improved significantly, with history of recurrent diabetic foot wounds, essential hypertension, hyperlipidemia, anemia of chronic disease with baseline hemoglobin of 10-12 and other comorbidities who presents from the podiatry office for a left foot wound.  Over the last several weeks he is reported worsening of the wound on the dorsal surface of the left foot over the first toe and now after 7 to 10 days he had progressive erythema, swelling tenderness and warmth over the dorsal aspect of the left midfoot associated with some purulent drainage.  He denies any trauma and states that he is neuropathy and has really no feeling in his feet.    He presented to his podiatry office who is very concerned and sent the patient over to the ED for further evaluation and workup and he was admitted for his diabetic foot wound and podiatry and infectious diseases been consulted.    Given his MRI findings the infectious disease doctor recommended a TMA and patient was taken for TMA on 04/01/23 but podiatry feels that he may not heal properly and recommending probably definitive surgery with a BKA so Ortho was consulted to discuss BKA option versus repeat washout and potential closure.  The old surgery recommended transtibial amputation and patient is agreeable and plan is for surgical intervention on Wednesday, 04/06/2023.  Assessment and Plan:  Infected Left Diabetic Food Wound/Ulcer -Ulcerative lesion associated on the dorsal aspect of the left first toe with surrounding erythema, and tenderness warmth swelling and some redness.  No drainage with extension these findings approximately over the dorsal  aspect of the left midfoot -Plain foot x-ray showed interval development of severe soft tissue swelling and absence of any evidence of subcutaneous gas -MRI of the foot done and showed "Small first MTP joint effusion with synovitis and subchondral marrow edema concerning for septic arthritis. Severe bone marrow edema in the fifth metatarsal head and shaft concerning for osteomyelitis. Soft tissue edema along the medial aspect of the forefoot concerning for cellulitis. Complex fluid collection along the medial aspect of the first metatarsal measuring 1.5 x 1.8 x 2.7 cm consistent with an abscess. Small abscess along the medial aspect of the first metatarsal head measuring 1.4 x 0.2 cm. 10 mm abscess in the soft tissues between the first and second metatarsal heads  -Was placed on IV vancomycin and cefepime will continue as well as IV Flagyl -CBC and inflammatory markers trend: Recent Labs  Lab 03/31/23 1610 03/31/23 2048 03/31/23 2258 04/01/23 0200 04/02/23 0214 04/03/23 0044  WBC 10.6*  --   --  5.1 4.4 4.4  LATICACIDVEN  --    < > 1.4  --   --   --   CRP  --   --  20.3*  --   --   --    < > = values in this interval not displayed.  -Podiatry consulted and took the patient for left transmetatarsal amputation with possible serial irrigation and debridement on 04/01/23; Podiatry feels that due to the significant amount of bone was removed during surgery and appears to continue to have some necrosis cellulitis to his medial incision that he may need to go to the OR for repeat washout  and potential closure however proximal amputation may be considered given his low likelihood of success for healing with constant surgery and wound care.  Will check ABIs (pending) and the podiatry team recommends an Ortho Consult  -Dr. Lajoyce Corners evaluated and recommended proceeding with a left transtibial amputation and the risks and benefits were discussed and the patient understands and wishes to proceed with the surgery  with the plan for surgical intervention on Wednesday, 04/06/2023; Dr. Lajoyce Corners is placing order for uric acid level and type and screen and transfusion with 1 unit of PRBCs -Infectious Diseases consulted for further evaluation recommendations and they are recommending continue broad-spectrum antibiotics for next 48 hours and discussing oral regimen to do afterwards pending culture studies however given that the patient has elected for transtibial amputation may not need antibiotics and per ID once BKA is done we can stop all antibiotic.  Continue IV Vanco, cefepime and IV Flagyl for now recommending reengaging if there is no plans to do BKA -Continue with pain control with IV Hydromorphone 0.5 mg every 2 as needed for severe pain, Acetaminophen 650 mg p.o./RC every 6 as needed for mild pain or fever -Weightbearing status per Podiatry and will need PT OT to further evaluate and treat  Anion Gap Metabolic Acidosis -In the setting of Foot Infection -Lactic Acid Level Trend: Recent Labs  Lab 03/31/23 2048 03/31/23 2258  LATICACIDVEN 1.0 1.4  -Patient's CO2 is now 20, AG is 8, Chloride Level is 102 -Continue To monitor and trend and repeat CMP in a.m.  Hypokalemia -Patient's K+ Level Trend: Recent Labs  Lab 03/31/23 1610 04/01/23 0200 04/02/23 0214 04/03/23 0044  K 3.4* 3.6 3.7 3.4*  -Replete with po Kcl 40 mEQ BID x2 -Continue to Monitor and Replete as Necessary -Repeat CMP in the AM   Hypomagnesemia -Patient's Mag Level Trend: Recent Labs  Lab 03/31/23 2258 04/01/23 0200 04/02/23 0214 04/03/23 0044  MG 2.1 1.9 1.8 1.6*  -Replete with IV Mag Sulfate 2 grams -Continue to Monitor and Replete as Necessary -Repeat Mag in the AM   Hyponatremia Na+ Trend: Recent Labs  Lab 03/31/23 1610 04/01/23 0200 04/02/23 0214 04/03/23 0044  NA 135 133* 135 130*  -Continue to Monitor and Trend and repeat CMP in the AM  Chronic Combined Systolic and Diastolic CHF -Most Recent ECHO in January  2023 showed EF of 45-50% and Grade 3DD -Currently appears Euvolemic and no clinical evidence to suggest Acute Decompensation -Takes PRN Lasix at Home and Metoprolol Succinate -Strict I's and O's and Daily Weights;  Intake/Output Summary (Last 24 hours) at 04/03/2023 1341 Last data filed at 04/03/2023 1316 Gross per 24 hour  Intake 2081.27 ml  Output 1800 ml  Net 281.27 ml  -Resumed Metoprolol Succinate 25 mg po Daily  -Continue to monitor for signs and symptoms of volume overload  Diabetes Mellitus Type 2, now Diet controlled -Well controlled and repeat HbA1c was 5.4 -Has a Hx of Diabetes Mellitus type 2 -Not on any Insulin or Oral Hypoglycemics as an outpatient  -Placed on q6h CBGs with Sensitive Novolog SSI  -Continue to Monitor CBG's per Protocol -CBG Trend:  Recent Labs  Lab 04/02/23 0543 04/02/23 1111 04/02/23 1806 04/02/23 2356 04/03/23 0631 04/03/23 1156 04/03/23 1205  GLUCAP 147* 171* 169* 167* 196* 108* 117*   Essential HTN -Home Meds include Metoprolol Succinate and Amlodipine which have been resumed -Continue to Monitor BP per Protocol -Last BP Reading was 133/79  HLD -On Atorvastatin as an outpatient and resumed now  Constipation -Bowel regimen as needed and have initiated with MiraLAX 17 g p.o. daily as needed and senna docusate 1 tab p.o. nightly  Anemia of Chronic Disease -Has a Baseline Hgb ranging from 10-12 -Hgb/Hct Trend: Recent Labs  Lab 03/31/23 1610 04/01/23 0200 04/02/23 0214 04/03/23 0044  HGB 12.2* 10.2* 9.4* 8.2*  HCT 36.8* 30.7* 29.1* 25.1*  MCV 90.6 89.8 91.8 90.6  -Checked Anemia Panel and showed an iron level of 31, UIBC of 260, TIBC of 291, saturation ratios of 11%, ferritin level 130, folate of 33.1, vitamin B12 level of 2029 -He is being typed and screened and transfused 1 unit PRBCs per Orthopedic Surgery -Continue to Monitor for S/Sx of Bleeding; No overt bleeding noted -Repeat CBC in the AM   Hypoalbuminemia -Patient's  Albumin Trend: Recent Labs  Lab 03/31/23 1610 04/01/23 0200 04/02/23 0214 04/03/23 0044  ALBUMIN 2.9* 2.3* 2.4* 2.0*  -Continue to Monitor and Trend and repeat CMP in the AM  Obesity -Complicates overall prognosis and care -Estimated body mass index is 37.76 kg/m as calculated from the following:   Height as of this encounter: 6' (1.829 m).   Weight as of this encounter: 126.3 kg.  -Weight Loss and Dietary Counseling given   DVT prophylaxis: SCDs Start: 03/31/23 2000    Code Status: Full Code Family Communication: No family present at bedside  Disposition Plan:  Level of care: Telemetry Medical Status is: Inpatient Remains inpatient appropriate because: Plan is for transtibial amputation on the left on 04/06/2023   Consultants:  Podiatry Infectious Diseases Orthopedic Surgery   Procedures:  Procedure:  1. Left transmetatarsal amputation possible serial irrigation and debrideiment  Antimicrobials:  Anti-infectives (From admission, onward)    Start     Dose/Rate Route Frequency Ordered Stop   04/03/23 2200  vancomycin (VANCOREADY) IVPB 1500 mg/300 mL        1,500 mg 150 mL/hr over 120 Minutes Intravenous Every 12 hours 04/03/23 1240     04/01/23 1000  vancomycin (VANCOCIN) IVPB 1000 mg/200 mL premix  Status:  Discontinued        1,000 mg 200 mL/hr over 60 Minutes Intravenous Every 12 hours 03/31/23 2029 04/03/23 1240   03/31/23 2015  vancomycin (VANCOREADY) IVPB 2000 mg/400 mL        2,000 mg 200 mL/hr over 120 Minutes Intravenous  Once 03/31/23 2008 04/01/23 0412   03/31/23 2015  ceFEPIme (MAXIPIME) 2 g in sodium chloride 0.9 % 100 mL IVPB        2 g 200 mL/hr over 30 Minutes Intravenous Every 8 hours 03/31/23 2008     03/31/23 2015  metroNIDAZOLE (FLAGYL) IVPB 500 mg        500 mg 100 mL/hr over 60 Minutes Intravenous Every 12 hours 03/31/23 2008     03/31/23 1945  vancomycin (VANCOCIN) IVPB 1000 mg/200 mL premix  Status:  Discontinued        1,000 mg 200  mL/hr over 60 Minutes Intravenous  Once 03/31/23 1933 03/31/23 2008   03/31/23 1945  ceFEPIme (MAXIPIME) 2 g in sodium chloride 0.9 % 100 mL IVPB  Status:  Discontinued        2 g 200 mL/hr over 30 Minutes Intravenous  Once 03/31/23 1933 03/31/23 2008       Subjective: Seen and examined at bedside and he denies any pain.  Feels okay and has elected for BKA.  States that he is constipated and not had a bowel meant since Thursday so bowel regimen has  been initiated as needed. No other concerns or complaints at this time and states he slept well.  Objective: Vitals:   04/02/23 1637 04/02/23 2004 04/03/23 0411 04/03/23 0742  BP: 138/79 128/64 138/77 133/79  Pulse: 79 93 85 73  Resp: 19 18 18 15   Temp: 98.5 F (36.9 C) 98.2 F (36.8 C)  97.9 F (36.6 C)  TempSrc: Oral Oral  Oral  SpO2: 97% 94% 96% 98%  Weight:      Height:        Intake/Output Summary (Last 24 hours) at 04/03/2023 1415 Last data filed at 04/03/2023 1316 Gross per 24 hour  Intake 2081.27 ml  Output 1800 ml  Net 281.27 ml   Filed Weights   03/31/23 1603  Weight: 126.3 kg   Examination: Physical Exam:  Constitutional: WN/WD obese Caucasian male in NAD Respiratory: Diminished to auscultation bilaterally, no wheezing, rales, rhonchi or crackles. Normal respiratory effort and patient is not tachypenic. No accessory muscle use. Unlabored breathing  Cardiovascular: RRR, no murmurs / rubs / gallops. S1 and S2 auscultated. Left foot is wrapped Abdomen: Soft, non-tender, Distended 2/2 body habitus. Bowel sounds positive.  GU: Deferred. Musculoskeletal: No clubbing / cyanosis of digits/nails. Has a Left TMA and left foot is wrapped Skin: No rashes, lesions, ulcers on a limited skin evaluation. No induration; Warm and dry.  Neurologic: CN 2-12 grossly intact with no focal deficits. Romberg sign and cerebellar reflexes not assessed.  Psychiatric: Normal judgment and insight. Alert and oriented x 3. Normal mood and  appropriate affect.   Data Reviewed: I have personally reviewed following labs and imaging studies  CBC: Recent Labs  Lab 03/31/23 1610 04/01/23 0200 04/02/23 0214 04/03/23 0044  WBC 10.6* 5.1 4.4 4.4  NEUTROABS 8.8* 3.9 2.7 2.6  HGB 12.2* 10.2* 9.4* 8.2*  HCT 36.8* 30.7* 29.1* 25.1*  MCV 90.6 89.8 91.8 90.6  PLT 233 172 188 163   Basic Metabolic Panel: Recent Labs  Lab 03/31/23 1610 03/31/23 2258 04/01/23 0200 04/02/23 0214 04/03/23 0044  NA 135  --  133* 135 130*  K 3.4*  --  3.6 3.7 3.4*  CL 102  --  103 102 102  CO2 17*  --  20* 22 20*  GLUCOSE 106*  --  174* 139* 162*  BUN 20  --  23 20 18   CREATININE 1.00  --  0.99 0.82 0.74  CALCIUM 9.2  --  8.9 8.8* 8.3*  MG  --  2.1 1.9 1.8 1.6*  PHOS  --   --   --  4.1 4.2   GFR: Estimated Creatinine Clearance: 133.2 mL/min (by C-G formula based on SCr of 0.74 mg/dL). Liver Function Tests: Recent Labs  Lab 03/31/23 1610 04/01/23 0200 04/02/23 0214 04/03/23 0044  AST 33 26 29 24   ALT 44 34 34 30  ALKPHOS 177* 153* 152* 143*  BILITOT 1.2 0.9 0.7 0.6  PROT 8.3* 6.7 7.0 6.1*  ALBUMIN 2.9* 2.3* 2.4* 2.0*   No results for input(s): "LIPASE", "AMYLASE" in the last 168 hours. No results for input(s): "AMMONIA" in the last 168 hours. Coagulation Profile: Recent Labs  Lab 04/01/23 0200  INR 1.2   Cardiac Enzymes: Recent Labs  Lab 03/31/23 2258  CKTOTAL 43*   BNP (last 3 results) No results for input(s): "PROBNP" in the last 8760 hours. HbA1C: Recent Labs    03/31/23 2258  HGBA1C 5.4   CBG: Recent Labs  Lab 04/02/23 1806 04/02/23 2356 04/03/23 0631 04/03/23 1156 04/03/23 1205  GLUCAP 169* 167* 196* 108* 117*   Lipid Profile: No results for input(s): "CHOL", "HDL", "LDLCALC", "TRIG", "CHOLHDL", "LDLDIRECT" in the last 72 hours. Thyroid Function Tests: No results for input(s): "TSH", "T4TOTAL", "FREET4", "T3FREE", "THYROIDAB" in the last 72 hours. Anemia Panel: Recent Labs    04/02/23 0214   VITAMINB12 2,029*  FOLATE 33.1  FERRITIN 138  TIBC 291  IRON 31*  RETICCTPCT 1.7   Sepsis Labs: Recent Labs  Lab 03/31/23 2048 03/31/23 2258  LATICACIDVEN 1.0 1.4   Recent Results (from the past 240 hour(s))  Blood culture (routine x 2)     Status: None (Preliminary result)   Collection Time: 03/31/23  8:48 PM   Specimen: BLOOD  Result Value Ref Range Status   Specimen Description BLOOD BLOOD LEFT FOREARM  Final   Special Requests   Final    BOTTLES DRAWN AEROBIC AND ANAEROBIC Blood Culture adequate volume   Culture   Final    NO GROWTH 3 DAYS Performed at [facility], [address] [place], [place] [address]    Report Status PENDING  Incomplete  Blood culture (routine x 2)     Status: None (Preliminary result)   Collection Time: 03/31/23 10:58 PM   Specimen: BLOOD RIGHT ARM  Result Value Ref Range Status   Specimen Description BLOOD RIGHT ARM  Final   Special Requests   Final    BOTTLES DRAWN AEROBIC AND ANAEROBIC Blood Culture adequate volume   Culture   Final    NO GROWTH 3 DAYS Performed at [facility], [address] [place], [place] [address]    Report Status PENDING  Incomplete  Surgical pcr screen     Status: Abnormal   Collection Time: 04/01/23 12:40 AM   Specimen: Nasal Mucosa; Nasal Swab  Result Value Ref Range Status   MRSA, PCR POSITIVE (A) NEGATIVE Final    Comment: RESULT CALLED TO, READ BACK BY AND VERIFIED WITH: A. PETTIFORD RN 04/01/23 @ 0223 BY AB    Staphylococcus aureus POSITIVE (A) NEGATIVE Final    Comment: (NOTE) The Xpert SA Assay (FDA approved for NASAL specimens in patients 75 years of age and older), is one component of a comprehensive surveillance program. It is not intended to diagnose infection nor to guide or monitor treatment. Performed at [facility], [address] [place], [place] [address]   Aerobic/Anaerobic Culture w Gram Stain (surgical/deep wound)     Status: None (Preliminary result)    Collection Time: 04/01/23  1:37 PM   Specimen: Path Tissue  Result Value Ref Range Status   Specimen Description WOUND  Final   Special Requests RESIDUAL FIRST METATARSEL  Final   Gram Stain   Final    NO WBC SEEN NO ORGANISMS SEEN Performed at [facility], [address], [place], [place] [address]    Culture   Final    RARE STAPHYLOCOCCUS AUREUS SUSCEPTIBILITIES TO FOLLOW CULTURE REINCUBATED FOR BETTER GROWTH NO ANAEROBES ISOLATED; CULTURE IN PROGRESS FOR 5 DAYS    Report Status PENDING  Incomplete    Radiology Studies: DG Foot Complete Left  Result Date: 04/01/2023 CLINICAL DATA:  Osteomyelitis of left foot EXAM: LEFT FOOT - COMPLETE 3+ VIEW COMPARISON:  03/31/2023 FINDINGS: Status post transmetatarsal amputation of all 5 left toes. No visible complicating feature. No acute bony abnormality within the remaining bony structures. Plantar calcaneal spur. IMPRESSION: Postoperative changes from amputation of all 5 toes at the transmetatarsal level. No complicating  feature. Electronically Signed   By: Charlett Nose M.D.   On: 04/01/2023 19:37    Scheduled Meds:  amLODipine  5 mg Oral Daily   atorvastatin  80 mg Oral Daily   Chlorhexidine Gluconate Cloth  6 each Topical Q0600   folic acid  1 mg Oral Daily   insulin aspart  0-9 Units Subcutaneous Q6H   metoprolol succinate  25 mg Oral Daily   mupirocin ointment  1 Application Nasal BID   potassium chloride  40 mEq Oral BID   thiamine  100 mg Oral Daily   Continuous Infusions:  ceFEPime (MAXIPIME) IV 2 g (04/03/23 1149)   metronidazole 500 mg (04/03/23 0848)   vancomycin      LOS: 3 days   Marguerita Merles, DO [facility] Available via Epic secure chat 7am-7pm After these hours, please refer to coverage provider listed on [email or web address] 04/03/2023, 2:15 PM
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
## 2023-04-03 NOTE — Plan of Care (Signed)
  Problem: Metabolic: Goal: Ability to maintain appropriate glucose levels will improve Outcome: Progressing   Problem: Skin Integrity: Goal: Risk for impaired skin integrity will decrease Outcome: Progressing   Problem: Coping: Goal: Level of anxiety will decrease Outcome: Progressing

## 2023-04-03 NOTE — Progress Notes (Signed)
Inpatient Rehab Admissions Coordinator Note:   Per therapy patient was screened for CIR candidacy by Desteni Piscopo Luvenia Starch, CCC-SLP. At this time, pt not medically ready for potential CIR admission. Pt with pending surgical intervention on Wednesday. Will not pursue a rehab consult at this time. CIR admissions team will follow to monitor for medical readiness. A consult will be placed if pt appears to be an appropriate candidate.    Wolfgang Phoenix, MS, CCC-SLP Admissions Coordinator [phone] 04/03/23 5:09 PM
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
## 2023-04-03 NOTE — Progress Notes (Signed)
Id brief note  Foot wound cx staph aureus pending susceptibility  Dr Lajoyce Corners planned bka   A/p Diabetic foot om/cellulitis   -once bka is done can stop all abx -continue vanc/cefepime/flagyl for now -id will sign off -- please reengage if no plan to do bka -discussed with primary team

## 2023-04-03 NOTE — Progress Notes (Signed)
Pharmacy Antibiotic Note  [name] is a 62 y.o. male admitted on 03/31/2023 with diabetic foot infection with concerns for osteomyelitis. Pharmacy has been consulted for cefepime and vancomycin dosing.  Plan: Cefepime 2g IV every 8 hours  Increase Vancomycin to 1500mg  IV every 12 hours (AUC 512, Vd 0.5, sCr 0.8) Flagyl dosed by MD Monitor renal function, WBC, fever curve, obtain levels as necessary Stop antibiotics once BKA complete per ID note 6/16  Height: 6' (182.9 cm) Weight: 126.3 kg (278 lb 7.1 oz) IBW/kg (Calculated) : 77.6  Temp (24hrs), Avg:98.2 F (36.8 C), Min:97.9 F (36.6 C), Max:98.5 F (36.9 C)  Recent Labs  Lab 03/31/23 1610 03/31/23 2048 03/31/23 2258 04/01/23 0200 04/02/23 0214 04/03/23 0044  WBC 10.6*  --   --  5.1 4.4 4.4  CREATININE 1.00  --   --  0.99 0.82 0.74  LATICACIDVEN  --  1.0 1.4  --   --   --      Estimated Creatinine Clearance: 133.2 mL/min (by C-G formula based on SCr of 0.74 mg/dL).    Allergies  Allergen Reactions   Claritin [Loratadine] Shortness Of Breath and Anxiety    Antimicrobials this admission: Cefepime 6/13 >>  Vancomycin 6/13 >>  Flagyl 6/13 >>  Microbiology results: 6/13 BCx: NGTD 6/14 Wound Cx: staph aureus, sensis pending 6/14 MRSA Nares PCR +  Thank you for allowing pharmacy to be a part of this patient's care.  Alvia Grove, PharmD, MS, BCPS PGY2 Pharmacy Resident 04/03/2023 12:37 PM

## 2023-04-04 DIAGNOSIS — L089 Local infection of the skin and subcutaneous tissue, unspecified: Secondary | ICD-10-CM | POA: Diagnosis not present

## 2023-04-04 DIAGNOSIS — S91332A Puncture wound without foreign body, left foot, initial encounter: Secondary | ICD-10-CM | POA: Diagnosis not present

## 2023-04-04 DIAGNOSIS — I5042 Chronic combined systolic (congestive) and diastolic (congestive) heart failure: Secondary | ICD-10-CM | POA: Diagnosis not present

## 2023-04-04 DIAGNOSIS — E119 Type 2 diabetes mellitus without complications: Secondary | ICD-10-CM | POA: Diagnosis not present

## 2023-04-04 LAB — CBC WITH DIFFERENTIAL/PLATELET
Abs Immature Granulocytes: 0.1 10*3/uL — ABNORMAL HIGH (ref 0.00–0.07)
Basophils Absolute: 0 10*3/uL (ref 0.0–0.1)
Basophils Relative: 1 %
Eosinophils Absolute: 0.1 10*3/uL (ref 0.0–0.5)
Eosinophils Relative: 2 %
HCT: 30.5 % — ABNORMAL LOW (ref 39.0–52.0)
Hemoglobin: 10.4 g/dL — ABNORMAL LOW (ref 13.0–17.0)
Immature Granulocytes: 2 %
Lymphocytes Relative: 24 %
Lymphs Abs: 1.4 10*3/uL (ref 0.7–4.0)
MCH: 30.7 pg (ref 26.0–34.0)
MCHC: 34.1 g/dL (ref 30.0–36.0)
MCV: 90 fL (ref 80.0–100.0)
Monocytes Absolute: 0.4 10*3/uL (ref 0.1–1.0)
Monocytes Relative: 7 %
Neutro Abs: 3.7 10*3/uL (ref 1.7–7.7)
Neutrophils Relative %: 64 %
Platelets: 180 10*3/uL (ref 150–400)
RBC: 3.39 MIL/uL — ABNORMAL LOW (ref 4.22–5.81)
RDW: 14.8 % (ref 11.5–15.5)
WBC: 5.7 10*3/uL (ref 4.0–10.5)
nRBC: 0 % (ref 0.0–0.2)

## 2023-04-04 LAB — GLUCOSE, CAPILLARY
Glucose-Capillary: 102 mg/dL — ABNORMAL HIGH (ref 70–99)
Glucose-Capillary: 108 mg/dL — ABNORMAL HIGH (ref 70–99)
Glucose-Capillary: 113 mg/dL — ABNORMAL HIGH (ref 70–99)
Glucose-Capillary: 128 mg/dL — ABNORMAL HIGH (ref 70–99)
Glucose-Capillary: 138 mg/dL — ABNORMAL HIGH (ref 70–99)

## 2023-04-04 LAB — COMPREHENSIVE METABOLIC PANEL
ALT: 27 U/L (ref 0–44)
AST: 30 U/L (ref 15–41)
Albumin: 2.4 g/dL — ABNORMAL LOW (ref 3.5–5.0)
Alkaline Phosphatase: 130 U/L — ABNORMAL HIGH (ref 38–126)
Anion gap: 15 (ref 5–15)
BUN: 12 mg/dL (ref 8–23)
CO2: 18 mmol/L — ABNORMAL LOW (ref 22–32)
Calcium: 8.7 mg/dL — ABNORMAL LOW (ref 8.9–10.3)
Chloride: 104 mmol/L (ref 98–111)
Creatinine, Ser: 0.79 mg/dL (ref 0.61–1.24)
GFR, Estimated: >60 mL/min (ref >60)
Glucose, Bld: 121 mg/dL — ABNORMAL HIGH (ref 70–99)
Potassium: 4.3 mmol/L (ref 3.5–5.1)
Sodium: 137 mmol/L (ref 135–145)
Total Bilirubin: 0.9 mg/dL (ref 0.3–1.2)
Total Protein: 7.2 g/dL (ref 6.5–8.1)

## 2023-04-04 LAB — BPAM RBC: Unit Type and Rh: 7300

## 2023-04-04 LAB — SURGICAL PATHOLOGY

## 2023-04-04 LAB — AEROBIC/ANAEROBIC CULTURE W GRAM STAIN (SURGICAL/DEEP WOUND)

## 2023-04-04 LAB — MAGNESIUM: Magnesium: 1.7 mg/dL (ref 1.7–2.4)

## 2023-04-04 LAB — TYPE AND SCREEN: Antibody Screen: NEGATIVE

## 2023-04-04 LAB — PHOSPHORUS: Phosphorus: 2.7 mg/dL (ref 2.5–4.6)

## 2023-04-04 MED ORDER — MAGNESIUM SULFATE 2 GM/50ML IV SOLN
2.0000 g | Freq: Once | INTRAVENOUS | Status: AC
  Administered 2023-04-04: 2 g via INTRAVENOUS
  Filled 2023-04-04: qty 50

## 2023-04-04 MED ORDER — SODIUM BICARBONATE 650 MG PO TABS
650.0000 mg | ORAL_TABLET | Freq: Two times a day (BID) | ORAL | Status: DC
  Administered 2023-04-04 – 2023-04-05 (×2): 650 mg via ORAL
  Filled 2023-04-04 (×2): qty 1

## 2023-04-04 NOTE — Plan of Care (Signed)

## 2023-04-04 NOTE — Progress Notes (Signed)
PROGRESS NOTE    [name]  FAO:[id number] DOB: 1961-02-22 DOA: 03/31/2023 PCP: [name], MD   Brief Narrative:  The patient is a 62 year old obese Caucasian male with a past medical history significant for benign to chronic systolic and diastolic CHF, diabetes mellitus type 2 which is now diet controlled and improved significantly, with history of recurrent diabetic foot wounds, essential hypertension, hyperlipidemia, anemia of chronic disease with baseline hemoglobin of 10-12 and other comorbidities who presents from the podiatry office for a left foot wound.  Over the last several weeks he is reported worsening of the wound on the dorsal surface of the left foot over the first toe and now after 7 to 10 days he had progressive erythema, swelling tenderness and warmth over the dorsal aspect of the left midfoot associated with some purulent drainage.  He denies any trauma and states that he is neuropathy and has really no feeling in his feet.    He presented to his podiatry office who is very concerned and sent the patient over to the ED for further evaluation and workup and he was admitted for his diabetic foot wound and podiatry and infectious diseases been consulted.    Given his MRI findings the infectious disease doctor recommended a TMA and patient was taken for TMA on 04/01/23 but podiatry feels that he may not heal properly and recommending probably definitive surgery with a BKA so Ortho was consulted to discuss BKA option versus repeat washout and potential closure.  The old surgery recommended transtibial amputation and patient is agreeable and plan is for surgical intervention on Wednesday, 04/06/2023.  Assessment and Plan:  Infected Left Diabetic Food Wound/Ulcer -Ulcerative lesion associated on the dorsal aspect of the left first toe with surrounding erythema, and tenderness warmth swelling and some redness.  No drainage with extension these findings approximately over the dorsal  aspect of the left midfoot -Plain foot x-ray showed interval development of severe soft tissue swelling and absence of any evidence of subcutaneous gas -MRI of the foot done and showed "Small first MTP joint effusion with synovitis and subchondral marrow edema concerning for septic arthritis. Severe bone marrow edema in the fifth metatarsal head and shaft concerning for osteomyelitis. Soft tissue edema along the medial aspect of the forefoot concerning for cellulitis. Complex fluid collection along the medial aspect of the first metatarsal measuring 1.5 x 1.8 x 2.7 cm consistent with an abscess. Small abscess along the medial aspect of the first metatarsal head measuring 1.4 x 0.2 cm. 10 mm abscess in the soft tissues between the first and second metatarsal heads  -Was placed on IV vancomycin and cefepime will continue as well as IV Flagyl but now Cefepime and Flagyl being stopped given Wound Sensitivities  -CBC and inflammatory markers trend: Recent Labs  Lab 03/31/23 1610 03/31/23 2048 03/31/23 2258 04/01/23 0200 04/02/23 0214 04/03/23 0044 04/04/23 1006  WBC 10.6*  --   --  5.1 4.4 4.4 5.7  LATICACIDVEN  --    < > 1.4  --   --   --   --   CRP  --   --  20.3*  --   --   --   --    < > = values in this interval not displayed.  -Podiatry consulted and took the patient for left transmetatarsal amputation with possible serial irrigation and debridement on 04/01/23; Podiatry feels that due to the significant amount of bone was removed during surgery and appears to continue to  have some necrosis cellulitis to his medial incision that he may need to go to the OR for repeat washout and potential closure however proximal amputation may be considered given his low likelihood of success for healing with constant surgery and wound care.  Will check ABIs (pending) and the podiatry team recommends an Ortho Consult  -Dr. Lajoyce Corners evaluated and recommended proceeding with a left transtibial amputation and the risks  and benefits were discussed and the patient understands and wishes to proceed with the surgery with the plan for surgical intervention on Wednesday, 04/06/2023; Dr. Lajoyce Corners is placing order for uric acid level and type and screen and transfusion with 1 unit of PRBCs -Infectious Diseases consulted for further evaluation recommendations and they are recommending continue broad-spectrum antibiotics for next 48 hours and discussing oral regimen to do afterwards pending culture studies however given that the patient has elected for transtibial amputation may not need antibiotics and per ID once BKA is done we can stop all antibiotic.   -Wound Cx: Gram Stain NO WBC SEEN NO ORGANISMS SEEN Performed at [facility], [address] [place], [place] [address]  Culture RARE METHICILLIN RESISTANT STAPHYLOCOCCUS AUREUS RARE CORYNEBACTERIUM STRIATUM Standardized susceptibility testing for this organism is not available. NO ANAEROBES ISOLATED; CULTURE IN PROGRESS FOR 5 DAYS  Report Status PENDING  Organism ID, Bacteria METHICILLIN RESISTANT STAPHYLOCOCCUS AUREUS  Resulting Agency [facility]     Susceptibility   Methicillin resistant staphylococcus aureus    MIC    CIPROFLOXACIN 4 RESISTANT Resistant    CLINDAMYCIN <=0.25 SENS... Sensitive    ERYTHROMYCIN >=8 RESISTANT Resistant    GENTAMICIN <=0.5 SENSI... Sensitive    Inducible Clindamycin NEGATIVE Sensitive    LINEZOLID 2 SENSITIVE Sensitive    OXACILLIN >=4 RESISTANT Resistant    RIFAMPIN <=0.5 SENSI... Sensitive    TETRACYCLINE <=1 SENSITIVE Sensitive    TRIMETH/SULFA >=320 RESIS... Resistant    VANCOMYCIN 1 SENSITIVE Sensitive         -Continue with pain control with IV Hydromorphone 0.5 mg every 2 as needed for severe pain, Acetaminophen 650 mg p.o./RC every 6 as needed for mild pain or fever -Weightbearing status per Podiatry and will need PT OT to further evaluate and treat  Anion Gap Metabolic Acidosis -In the setting of Foot  Infection -Lactic Acid Level Trend: Recent Labs  Lab 03/31/23 2048 03/31/23 2258  LATICACIDVEN 1.0 1.4  -Patient's CO2 is now 18, AG is 15, Chloride Level is 104 -Start Sodium Bicarbonate Tabs 650 mg po BID -Continue To monitor and trend and repeat CMP in a.m.  Hypokalemia -Patient's K+ Level Trend: Recent Labs  Lab 03/31/23 1610 04/01/23 0200 04/02/23 0214 04/03/23 0044 04/04/23 1006  K 3.4* 3.6 3.7 3.4* 4.3  -Replete with po Kcl 40 mEQ BID x2 -Continue to Monitor and Replete as Necessary -Repeat CMP in the AM   Hypomagnesemia -Patient's Mag Level Trend: Recent Labs  Lab 03/31/23 2258 04/01/23 0200 04/02/23 0214 04/03/23 0044 04/04/23 1006  MG 2.1 1.9 1.8 1.6* 1.7  -Replete with IV Mag Sulfate 2 grams again -Continue to Monitor and Replete as Necessary -Repeat Mag in the AM   Hyponatremia Na+ Trend: Recent Labs  Lab 03/31/23 1610 04/01/23 0200 04/02/23 0214 04/03/23 0044 04/04/23 1006  NA 135 133* 135 130* 137  -Continue to Monitor and Trend and repeat CMP in the AM  Chronic Combined Systolic and Diastolic CHF -Most Recent ECHO in January 2023 showed EF of 45-50% and Grade 3DD -Currently appears  Euvolemic and no clinical evidence to suggest Acute Decompensation -Takes PRN Lasix at Home and Metoprolol Succinate -Strict I's and O's and Daily Weights;  Intake/Output Summary (Last 24 hours) at 04/04/2023 1609 Last data filed at 04/04/2023 1500 Gross per 24 hour  Intake 1655.71 ml  Output 2475 ml  Net -819.29 ml  -Resumed Metoprolol Succinate 25 mg po Daily  -Continue to monitor for signs and symptoms of volume overload  Diabetes Mellitus Type 2, now Diet controlled -Well controlled and repeat HbA1c was 5.4 -Has a Hx of Diabetes Mellitus type 2 -Not on any Insulin or Oral Hypoglycemics as an outpatient  -Placed on q6h CBGs with Sensitive Novolog SSI  -Continue to Monitor CBG's per Protocol -CBG Trend:  Recent Labs  Lab 04/03/23 1156 04/03/23 1205  04/04/23 0001 04/04/23 0619 04/04/23 0823 04/04/23 1200 04/04/23 1551  GLUCAP 108* 117* 102* 128* 113* 108* 138*   Essential HTN -Home Meds include Metoprolol Succinate and Amlodipine which have been resumed -Continue to Monitor BP per Protocol -Last BP Reading was 155/87  HLD -On Atorvastatin as an outpatient and resumed now  Constipation -Bowel regimen as needed and have initiated with MiraLAX 17 g p.o. daily as needed and senna docusate 1 tab p.o. nightly  Anemia of Chronic Disease -Has a Baseline Hgb ranging from 10-12 -Hgb/Hct Trend: Recent Labs  Lab 03/31/23 1610 04/01/23 0200 04/02/23 0214 04/03/23 0044 04/03/23 1946 04/04/23 1006  HGB 12.2* 10.2* 9.4* 8.2* 9.5* 10.4*  HCT 36.8* 30.7* 29.1* 25.1* 28.8* 30.5*  MCV 90.6 89.8 91.8 90.6  --  90.0  -Checked Anemia Panel and showed an iron level of 31, UIBC of 260, TIBC of 291, saturation ratios of 11%, ferritin level 130, folate of 33.1, vitamin B12 level of 2029 -He is being typed and screened and transfused 1 unit PRBCs per Orthopedic Surgery -Continue to Monitor for S/Sx of Bleeding; No overt bleeding noted -Repeat CBC in the AM   Hypoalbuminemia -Patient's Albumin Trend: Recent Labs  Lab 03/31/23 1610 04/01/23 0200 04/02/23 0214 04/03/23 0044 04/04/23 1006  ALBUMIN 2.9* 2.3* 2.4* 2.0* 2.4*  -Continue to Monitor and Trend and repeat CMP in the AM  Obesity -Complicates overall prognosis and care -Estimated body mass index is 37.76 kg/m as calculated from the following:   Height as of this encounter: 6' (1.829 m).   Weight as of this encounter: 126.3 kg.  -Weight Loss and Dietary Counseling given   DVT prophylaxis: SCDs Start: 03/31/23 2000    Code Status: Full Code Family Communication: No family at bedside  Disposition Plan:  Level of care: Telemetry Medical Status is: Inpatient Remains inpatient appropriate because: BKA on Wednesday, 04/06/2023   Consultants:  Podiatry Infectious  Diseases Orthopedic Surgery   Procedures:  1. Left transmetatarsal amputation possible serial irrigation and debrideiment   Antimicrobials:  Anti-infectives (From admission, onward)    Start     Dose/Rate Route Frequency Ordered Stop   04/03/23 2200  vancomycin (VANCOREADY) IVPB 1500 mg/300 mL        1,500 mg 150 mL/hr over 120 Minutes Intravenous Every 12 hours 04/03/23 1240     04/01/23 1000  vancomycin (VANCOCIN) IVPB 1000 mg/200 mL premix  Status:  Discontinued        1,000 mg 200 mL/hr over 60 Minutes Intravenous Every 12 hours 03/31/23 2029 04/03/23 1240   03/31/23 2015  vancomycin (VANCOREADY) IVPB 2000 mg/400 mL        2,000 mg 200 mL/hr over 120 Minutes Intravenous  Once  03/31/23 2008 04/01/23 0412   03/31/23 2015  ceFEPIme (MAXIPIME) 2 g in sodium chloride 0.9 % 100 mL IVPB  Status:  Discontinued        2 g 200 mL/hr over 30 Minutes Intravenous Every 8 hours 03/31/23 2008 04/04/23 1146   03/31/23 2015  metroNIDAZOLE (FLAGYL) IVPB 500 mg  Status:  Discontinued        500 mg 100 mL/hr over 60 Minutes Intravenous Every 12 hours 03/31/23 2008 04/04/23 1146   03/31/23 1945  vancomycin (VANCOCIN) IVPB 1000 mg/200 mL premix  Status:  Discontinued        1,000 mg 200 mL/hr over 60 Minutes Intravenous  Once 03/31/23 1933 03/31/23 2008   03/31/23 1945  ceFEPIme (MAXIPIME) 2 g in sodium chloride 0.9 % 100 mL IVPB  Status:  Discontinued        2 g 200 mL/hr over 30 Minutes Intravenous  Once 03/31/23 1933 03/31/23 2008       Subjective: Seen and examined at bedside and thinks that his foot is little bit more odorous.  Denies any nausea or vomiting.  Feels okay otherwise.  States he slept fairly well.  No other concerns or complaints this time.  Objective: Vitals:   04/03/23 2023 04/04/23 0407 04/04/23 0821 04/04/23 1257  BP: 134/77 138/66 131/75 (!) 155/87  Pulse: 84 72 75 84  Resp: 17 17 18 18   Temp: 98 F (36.7 C)  97.7 F (36.5 C) 97.8 F (36.6 C)  TempSrc: Oral  Oral  Oral  SpO2: 97% 96% 94% 98%  Weight:      Height:        Intake/Output Summary (Last 24 hours) at 04/04/2023 1609 Last data filed at 04/04/2023 1500 Gross per 24 hour  Intake 1655.71 ml  Output 2475 ml  Net -819.29 ml   Filed Weights   03/31/23 1603  Weight: 126.3 kg   Examination: Physical Exam:  Constitutional: WN/WD obese Caucasian male in no acute distress sitting on the chair Respiratory: Diminished to auscultation bilaterally, no wheezing, rales, rhonchi or crackles. Normal respiratory effort and patient is not tachypenic. No accessory muscle use.  Unlabored breathing Cardiovascular: RRR, no murmurs / rubs / gallops. S1 and S2 auscultated.  Left foot is wrapped but has no extremity edema Abdomen: Soft, non-tender, distended second to body habitus.  Bowel sounds positive.  GU: Deferred. Musculoskeletal: No clubbing / cyanosis of digits/nails.  TMA's and his foot is wrapped Skin: No rashes, lesions, ulcers on limited skin evaluation and has a left TMA now which is wrapped. No induration; Warm and dry.  Neurologic: CN 2-12 grossly intact with no focal deficits. Romberg sign and cerebellar reflexes not assessed.  Psychiatric: Normal judgment and insight. Alert and oriented x 3. Normal mood and appropriate affect.   Data Reviewed: I have personally reviewed following labs and imaging studies  CBC: Recent Labs  Lab 03/31/23 1610 04/01/23 0200 04/02/23 0214 04/03/23 0044 04/03/23 1946 04/04/23 1006  WBC 10.6* 5.1 4.4 4.4  --  5.7  NEUTROABS 8.8* 3.9 2.7 2.6  --  3.7  HGB 12.2* 10.2* 9.4* 8.2* 9.5* 10.4*  HCT 36.8* 30.7* 29.1* 25.1* 28.8* 30.5*  MCV 90.6 89.8 91.8 90.6  --  90.0  PLT 233 172 188 163  --  180   Basic Metabolic Panel: Recent Labs  Lab 03/31/23 1610 03/31/23 2258 04/01/23 0200 04/02/23 0214 04/03/23 0044 04/04/23 1006  NA 135  --  133* 135 130* 137  K 3.4*  --  3.6 3.7 3.4* 4.3  CL 102  --  103 102 102 104  CO2 17*  --  20* 22 20* 18*  GLUCOSE  106*  --  174* 139* 162* 121*  BUN 20  --  23 20 18 12   CREATININE 1.00  --  0.99 0.82 0.74 0.79  CALCIUM 9.2  --  8.9 8.8* 8.3* 8.7*  MG  --  2.1 1.9 1.8 1.6* 1.7  PHOS  --   --   --  4.1 4.2 2.7   GFR: Estimated Creatinine Clearance: 133.2 mL/min (by C-G formula based on SCr of 0.79 mg/dL). Liver Function Tests: Recent Labs  Lab 03/31/23 1610 04/01/23 0200 04/02/23 0214 04/03/23 0044 04/04/23 1006  AST 33 26 29 24 30   ALT 44 34 34 30 27  ALKPHOS 177* 153* 152* 143* 130*  BILITOT 1.2 0.9 0.7 0.6 0.9  PROT 8.3* 6.7 7.0 6.1* 7.2  ALBUMIN 2.9* 2.3* 2.4* 2.0* 2.4*   No results for input(s): "LIPASE", "AMYLASE" in the last 168 hours. No results for input(s): "AMMONIA" in the last 168 hours. Coagulation Profile: Recent Labs  Lab 04/01/23 0200  INR 1.2   Cardiac Enzymes: Recent Labs  Lab 03/31/23 2258  CKTOTAL 43*   BNP (last 3 results) No results for input(s): "PROBNP" in the last 8760 hours. HbA1C: No results for input(s): "HGBA1C" in the last 72 hours. CBG: Recent Labs  Lab 04/04/23 0001 04/04/23 0619 04/04/23 0823 04/04/23 1200 04/04/23 1551  GLUCAP 102* 128* 113* 108* 138*   Lipid Profile: No results for input(s): "CHOL", "HDL", "LDLCALC", "TRIG", "CHOLHDL", "LDLDIRECT" in the last 72 hours. Thyroid Function Tests: No results for input(s): "TSH", "T4TOTAL", "FREET4", "T3FREE", "THYROIDAB" in the last 72 hours. Anemia Panel: Recent Labs    04/02/23 0214  VITAMINB12 2,029*  FOLATE 33.1  FERRITIN 138  TIBC 291  IRON 31*  RETICCTPCT 1.7   Sepsis Labs: Recent Labs  Lab 03/31/23 2048 03/31/23 2258  LATICACIDVEN 1.0 1.4    Recent Results (from the past 240 hour(s))  Blood culture (routine x 2)     Status: None (Preliminary result)   Collection Time: 03/31/23  8:48 PM   Specimen: BLOOD  Result Value Ref Range Status   Specimen Description BLOOD BLOOD LEFT FOREARM  Final   Special Requests   Final    BOTTLES DRAWN AEROBIC AND ANAEROBIC Blood  Culture adequate volume   Culture   Final    NO GROWTH 4 DAYS Performed at [facility], [address] [place], [place] [address]    Report Status PENDING  Incomplete  Blood culture (routine x 2)     Status: None (Preliminary result)   Collection Time: 03/31/23 10:58 PM   Specimen: BLOOD RIGHT ARM  Result Value Ref Range Status   Specimen Description BLOOD RIGHT ARM  Final   Special Requests   Final    BOTTLES DRAWN AEROBIC AND ANAEROBIC Blood Culture adequate volume   Culture   Final    NO GROWTH 4 DAYS Performed at [facility], [address] [place], [place] [address]    Report Status PENDING  Incomplete  Surgical pcr screen     Status: Abnormal   Collection Time: 04/01/23 12:40 AM   Specimen: Nasal Mucosa; Nasal Swab  Result Value Ref Range Status   MRSA, PCR POSITIVE (A) NEGATIVE Final    Comment: RESULT CALLED TO, READ BACK BY AND VERIFIED WITH: A. PETTIFORD RN 04/01/23 @ 0223 BY AB  Staphylococcus aureus POSITIVE (A) NEGATIVE Final    Comment: (NOTE) The Xpert SA Assay (FDA approved for NASAL specimens in patients 53 years of age and older), is one component of a comprehensive surveillance program. It is not intended to diagnose infection nor to guide or monitor treatment. Performed at [facility], [address] [place], [place] [address]   Aerobic/Anaerobic Culture w Gram Stain (surgical/deep wound)     Status: None (Preliminary result)   Collection Time: 04/01/23  1:37 PM   Specimen: Path Tissue  Result Value Ref Range Status   Specimen Description WOUND  Final   Special Requests RESIDUAL FIRST METATARSEL  Final   Gram Stain   Final    NO WBC SEEN NO ORGANISMS SEEN Performed at [facility], [address], [place], [place] [address]    Culture   Final    RARE METHICILLIN RESISTANT STAPHYLOCOCCUS AUREUS RARE CORYNEBACTERIUM STRIATUM Standardized susceptibility testing for this organism is not available. NO ANAEROBES ISOLATED;  CULTURE IN PROGRESS FOR 5 DAYS    Report Status PENDING  Incomplete   Organism ID, Bacteria METHICILLIN RESISTANT STAPHYLOCOCCUS AUREUS  Final      Susceptibility   Methicillin resistant staphylococcus aureus - MIC*    CIPROFLOXACIN 4 RESISTANT Resistant     ERYTHROMYCIN >=8 RESISTANT Resistant     GENTAMICIN <=0.5 SENSITIVE Sensitive     OXACILLIN >=4 RESISTANT Resistant     TETRACYCLINE <=1 SENSITIVE Sensitive     VANCOMYCIN 1 SENSITIVE Sensitive     TRIMETH/SULFA >=320 RESISTANT Resistant     CLINDAMYCIN <=0.25 SENSITIVE Sensitive     RIFAMPIN <=0.5 SENSITIVE Sensitive     Inducible Clindamycin NEGATIVE Sensitive     LINEZOLID 2 SENSITIVE Sensitive     * RARE METHICILLIN RESISTANT STAPHYLOCOCCUS AUREUS    Radiology Studies: No results found.  Scheduled Meds:  amLODipine  5 mg Oral Daily   atorvastatin  80 mg Oral Daily   Chlorhexidine Gluconate Cloth  6 each Topical Q0600   folic acid  1 mg Oral Daily   insulin aspart  0-9 Units Subcutaneous Q6H   metoprolol succinate  25 mg Oral Daily   mupirocin ointment  1 Application Nasal BID   sodium bicarbonate  650 mg Oral BID   thiamine  100 mg Oral Daily   Continuous Infusions:  magnesium sulfate bolus IVPB     vancomycin 1,500 mg (04/04/23 1038)    LOS: 4 days   Marguerita Merles, DO [facility] Available via Epic secure chat 7am-7pm After these hours, please refer to coverage provider listed on [email or web address] 04/04/2023, 4:09 PM

## 2023-04-04 NOTE — Care Management Important Message (Signed)
Important Message  Patient Details  Name: [name] MRN: [id number] Date of Birth: 1961/06/19   Medicare Important Message Given:  Yes     Sherilyn Banker 04/04/2023, 4:09 PM

## 2023-04-04 NOTE — Progress Notes (Signed)
Physical Therapy Treatment Patient Details Name: [name] MRN: [id number] DOB: November 04, 1960 Today's Date: 04/04/2023   History of Present Illness 62 y.o. male admitted 6/13 with Infected Left Diabetic Foot Wound/Ulcer. S/p left transmetatarsal amputation 6/14. PMHx:  Sepsis  L diabetic foot infection/ulcer, possible early osteomyelitis, septic joint arthritis with infected tenosynovitis, HTN, obesity, HLD, prediabetes, alcoholic cirrhosis, alcohol abuse.    PT Comments    Further progression with gait, min guard with +2 for follow/safety. Tolerated well. Eager to get OOB. Planning Lt BKA Wednesday. Reviewed HEP. Patient will continue to benefit from skilled physical therapy services to further improve independence with functional mobility.   Recommendations for follow up therapy are one component of a multi-disciplinary discharge planning process, led by the attending physician.  Recommendations may be updated based on patient status, additional functional criteria and insurance authorization.     Assistance Recommended at Discharge Intermittent Supervision/Assistance  Patient can return home with the following A little help with walking and/or transfers;A little help with bathing/dressing/bathroom;Assistance with cooking/housework;Assist for transportation;Help with stairs or ramp for entrance   Equipment Recommendations  None recommended by PT       Precautions / Restrictions Precautions Precautions: Fall Required Braces or Orthoses: Other Brace Other Brace: Cam ordered but didn't fit. Has a large post-op shoe that does fit better. Restrictions Weight Bearing Restrictions: Yes LLE Weight Bearing: Non weight bearing     Mobility  Bed Mobility Overal bed mobility: Needs Assistance Bed Mobility: Sit to Supine       Sit to supine: Supervision   General bed mobility comments: Supervision for safety. Cues for technique.    Transfers Overall transfer level: Needs  assistance Equipment used: Rolling walker (2 wheels) Transfers: Sit to/from Stand Sit to Stand: Min guard           General transfer comment: Min guard for safety. Cues for technique and hand placement, slow to rise. Stable with RW upon standing.    Ambulation/Gait Ambulation/Gait assistance: Min guard, +2 safety/equipment Gait Distance (Feet): 22 Feet Assistive device: Rolling walker (2 wheels) Gait Pattern/deviations:  (hop) Gait velocity: slow Gait velocity interpretation: <1.31 ft/sec, indicative of household ambulator   General Gait Details: Further training with RW, cues for technique and smooth transition with hop showing improved control today. +2 follow with chair. No LOB, cues for awareness. Safely maintains NWB on LLE   Stairs             Wheelchair Mobility    Modified Rankin (Stroke Patients Only)       Balance Overall balance assessment: Needs assistance Sitting-balance support: No upper extremity supported, Feet supported Sitting balance-Leahy Scale: Good     Standing balance support: Bilateral upper extremity supported Standing balance-Leahy Scale: Poor Standing balance comment: needs RW support                            Cognition Arousal/Alertness: Awake/alert Behavior During Therapy: WFL for tasks assessed/performed Overall Cognitive Status: Within Functional Limits for tasks assessed                                          Exercises General Exercises - Lower Extremity Ankle Circles/Pumps: AROM, Both, 10 reps, Seated Quad Sets: Strengthening, Both, 10 reps, Seated Gluteal Sets: Strengthening, Both, 10 reps, Seated    General Comments  Pertinent Vitals/Pain Pain Assessment Pain Assessment: No/denies pain Pain Intervention(s): Monitored during session    Home Living                          Prior Function            PT Goals (current goals can now be found in the care plan  section) Acute Rehab PT Goals Patient Stated Goal: Get well PT Goal Formulation: With patient Time For Goal Achievement: 04/16/23 Potential to Achieve Goals: Good Progress towards PT goals: Progressing toward goals    Frequency    Min 4X/week      PT Plan Current plan remains appropriate    Co-evaluation              AM-PAC PT "6 Clicks" Mobility   Outcome Measure  Help needed turning from your back to your side while in a flat bed without using bedrails?: A Little Help needed moving from lying on your back to sitting on the side of a flat bed without using bedrails?: A Little Help needed moving to and from a bed to a chair (including a wheelchair)?: A Little Help needed standing up from a chair using your arms (e.g., wheelchair or bedside chair)?: A Little Help needed to walk in hospital room?: A Little Help needed climbing 3-5 steps with a railing? : A Lot 6 Click Score: 17    End of Session Equipment Utilized During Treatment: Gait belt Activity Tolerance: Patient tolerated treatment well Patient left: in chair;with call bell/phone within reach;with chair alarm set;with family/visitor present Nurse Communication: Mobility status PT Visit Diagnosis: Unsteadiness on feet (R26.81);Other abnormalities of gait and mobility (R26.89);Muscle weakness (generalized) (M62.81);Difficulty in walking, not elsewhere classified (R26.2)     Time: 4098-1191 PT Time Calculation (min) (ACUTE ONLY): 22 min  Charges:  $Gait Training: 8-22 mins                     Kathlyn Sacramento, PT, DPT [facility]  Rehabilitation Services Physical Therapist Office: [phone] Website: [email or web address]    Berton Mount 04/04/2023, 9:59 AM

## 2023-04-04 NOTE — Progress Notes (Signed)
Mobility Specialist Progress Note:    04/04/23 1625  Mobility  Activity Ambulated with assistance in hallway  Level of Assistance Contact guard assist, steadying assist  Assistive Device Front wheel walker  Distance Ambulated (ft) 30 ft  LLE Weight Bearing NWB  Activity Response Tolerated well  Mobility Referral Yes  $Mobility charge 1 Mobility  Mobility Specialist Start Time (ACUTE ONLY) 1613  Mobility Specialist Stop Time (ACUTE ONLY) 1625  Mobility Specialist Time Calculation (min) (ACUTE ONLY) 12 min   Pt received in chair, agreeable to ambulate. Pt needed no physical assisted standing and ambulating. Pt properly practiced NWB on LLE, needed a +2 chair follow for safety. No c/o throughout session. Pt assisted back to chair w/ call bell and personal belongings at hand.   Thompson Grayer Mobility Specialist  Please contact vis Secure Chat or  Rehab Office [phone]

## 2023-04-04 NOTE — TOC Initial Note (Signed)
Transition of Care [facility]) - Initial/Assessment Note    Patient Details  Name: [name] MRN: [id number] Date of Birth: 01-02-1961  Transition of Care [facility]) CM/SW Contact:    Epifanio Lesches, RN Phone Number: 04/04/2023, 4:44 PM  Clinical Narrative:                 Presents with infected Left Diabetic Foot Wound/Ulcer.  - S/p left transmetatarsal amputation 6/14  Plan:  BKA  for Wednesday From home with fiance. States PTA independent with ADL'S. DME: CANE ,RW, rollator, W/C.  CIR following...  TOC team following and will assist with needs.   Expected Discharge Plan: IP Rehab Facility (vs SNF) Barriers to Discharge: Continued Medical Work up   Patient Goals and CMS Choice            Expected Discharge Plan and Services   Discharge Planning Services: CM Consult   Living arrangements for the past 2 months: Single Family Home                                      Prior Living Arrangements/Services Living arrangements for the past 2 months: Single Family Home Lives with:: Significant Other (FIANCE) Patient language and need for interpreter reviewed:: Yes Do you feel safe going back to the place where you live?: Yes      Need for Family Participation in Patient Care: Yes (Comment) Care giver support system in place?: Yes (comment)   Criminal Activity/Legal Involvement Pertinent to Current Situation/Hospitalization: No - Comment as needed  Activities of Daily Living Home Assistive Devices/Equipment: Cane (specify quad or straight), Walker (specify type) (rollator) ADL Screening (condition at time of admission) Patient's cognitive ability adequate to safely complete daily activities?: No Is the patient deaf or have difficulty hearing?: No Does the patient have difficulty seeing, even when wearing glasses/contacts?: No Does the patient have difficulty concentrating, remembering, or making decisions?: No Patient able to express need for assistance with  ADLs?: Yes Does the patient have difficulty dressing or bathing?: No Independently performs ADLs?: Yes (appropriate for developmental age) Does the patient have difficulty walking or climbing stairs?: Yes (wound on left foot) Weakness of Legs: None Weakness of Arms/Hands: None  Permission Sought/Granted                  Emotional Assessment Appearance:: Appears stated age Attitude/Demeanor/Rapport: Engaged Affect (typically observed): Accepting Orientation: : Oriented to Self, Oriented to Place, Oriented to  Time, Oriented to Situation Alcohol / Substance Use: Not Applicable Psych Involvement: No (comment)  Admission diagnosis:  Left foot infection [L08.9] Penetrating foot wound, left, initial encounter [S91.332A] Patient Active Problem List   Diagnosis Date Noted   Severe protein-calorie malnutrition (HCC) 04/03/2023   Penetrating foot wound, left, initial encounter 03/31/2023   High anion gap metabolic acidosis 03/31/2023   Anemia of chronic disease 03/31/2023   Left foot infection 03/31/2023   Wrist pain, chronic, right 08/11/2022   Arthralgia 08/11/2022   Hand muscle atrophy 06/02/2022   Lumbago 04/15/2022   Chronic respiratory failure with hypoxia (HCC) 03/29/2022   Venous stasis dermatitis of both lower extremities 02/28/2022   Acute metabolic encephalopathy 02/20/2022   Chronic right-sided low back pain with right-sided sciatica 01/18/2022   Abnormal echocardiogram findings without diagnosis 01/18/2022   Chronic radicular low back pain 11/13/2021   Chronic bilateral low back pain without sciatica 09/09/2021   DM2 (diabetes mellitus, type  2) (HCC) 09/02/2021   Hypokalemia 09/02/2021   Hypomagnesemia 09/02/2021   Critical lower limb ischemia (HCC) 05/29/2021   Alcohol abuse 04/28/2021   Diabetic foot ulcer (HCC) 04/28/2021   Osteomyelitis (HCC) 04/27/2021   Anxiety 03/17/2021   Amputated toe of right foot (HCC) 03/03/2020   Alcoholic peripheral neuropathy  (HCC) 10/28/2019   Chronic combined systolic and diastolic heart failure (HCC) 10/15/2019   Chronic foot ulcer (HCC) 10/15/2019   Alcoholic cirrhosis of liver without ascites (HCC) 04/27/2019   Obesity hypoventilation syndrome (HCC) 04/27/2019   Depression 04/25/2019   DOE (dyspnea on exertion) 04/22/2019   Hypothyroidism 04/22/2019   Normocytic anemia 02/06/2019   Avascular necrosis of hip, left (HCC) 11/02/2018   Decreased hearing of both ears 10/30/2018   Avascular necrosis of hip, right (HCC) 08/16/2018   Bilateral leg edema 02/27/2018   Marijuana abuse 02/16/2018   Ulcer of left heel (HCC) 12/21/2016   Bilateral carpal tunnel syndrome 12/15/2016   Hyperlipidemia 11/25/2016   Essential hypertension 11/23/2016   History of osteomyelitis 11/23/2016   Morbid obesity (HCC) 11/23/2016   OSA (obstructive sleep apnea) 11/23/2016   Other hammer toe (acquired) 11/11/2013   Exstrophy of bladder 11/11/2013   PCP:  [name], MD Pharmacy:   [id number] [place], [place] - [address] [place] [address] Phone: [phone] Fax: [phone]     Social Determinants of Health (SDOH) Social History: SDOH Screenings   Food Insecurity: No Food Insecurity (03/31/2023)  Housing: Low Risk  (03/31/2023)  Transportation Needs: No Transportation Needs (03/31/2023)  Utilities: Not At Risk (03/31/2023)  Alcohol Screen: Low Risk  (07/14/2022)  Depression (PHQ2-9): Low Risk  (08/11/2022)  Recent Concern: Depression (PHQ2-9) - Medium Risk (07/14/2022)  Financial Resource Strain: Low Risk  (07/14/2022)  Physical Activity: Sufficiently Active (07/14/2022)  Social Connections: Socially Isolated (07/14/2022)  Stress: No Stress Concern Present (07/14/2022)  Tobacco Use: Medium Risk (04/02/2023)   SDOH Interventions:     Readmission Risk Interventions    11/16/2021   12:41 PM  Readmission Risk Prevention Plan  Transportation Screening Complete  PCP or Specialist Appt  within 3-5 Days Complete  HRI or Home Care Consult Complete  Social Work Consult for Recovery Care Planning/Counseling Complete  Palliative Care Screening Complete  Medication Review Oceanographer) Complete

## 2023-04-05 DIAGNOSIS — S91332A Puncture wound without foreign body, left foot, initial encounter: Secondary | ICD-10-CM | POA: Diagnosis not present

## 2023-04-05 DIAGNOSIS — L089 Local infection of the skin and subcutaneous tissue, unspecified: Secondary | ICD-10-CM | POA: Diagnosis not present

## 2023-04-05 DIAGNOSIS — I5042 Chronic combined systolic (congestive) and diastolic (congestive) heart failure: Secondary | ICD-10-CM | POA: Diagnosis not present

## 2023-04-05 DIAGNOSIS — E119 Type 2 diabetes mellitus without complications: Secondary | ICD-10-CM | POA: Diagnosis not present

## 2023-04-05 LAB — CBC WITH DIFFERENTIAL/PLATELET
Abs Immature Granulocytes: 0.12 10*3/uL — ABNORMAL HIGH (ref 0.00–0.07)
Basophils Absolute: 0 10*3/uL (ref 0.0–0.1)
Basophils Relative: 1 %
Eosinophils Absolute: 0.1 10*3/uL (ref 0.0–0.5)
Eosinophils Relative: 2 %
HCT: 28.4 % — ABNORMAL LOW (ref 39.0–52.0)
Hemoglobin: 9.3 g/dL — ABNORMAL LOW (ref 13.0–17.0)
Immature Granulocytes: 2 %
Lymphocytes Relative: 24 %
Lymphs Abs: 1.5 10*3/uL (ref 0.7–4.0)
MCH: 29.8 pg (ref 26.0–34.0)
MCHC: 32.7 g/dL (ref 30.0–36.0)
MCV: 91 fL (ref 80.0–100.0)
Monocytes Absolute: 0.4 10*3/uL (ref 0.1–1.0)
Monocytes Relative: 7 %
Neutro Abs: 4.1 10*3/uL (ref 1.7–7.7)
Neutrophils Relative %: 64 %
Platelets: 207 10*3/uL (ref 150–400)
RBC: 3.12 MIL/uL — ABNORMAL LOW (ref 4.22–5.81)
RDW: 14.9 % (ref 11.5–15.5)
WBC: 6.3 10*3/uL (ref 4.0–10.5)
nRBC: 0 % (ref 0.0–0.2)

## 2023-04-05 LAB — GLUCOSE, CAPILLARY
Glucose-Capillary: 115 mg/dL — ABNORMAL HIGH (ref 70–99)
Glucose-Capillary: 120 mg/dL — ABNORMAL HIGH (ref 70–99)
Glucose-Capillary: 120 mg/dL — ABNORMAL HIGH (ref 70–99)
Glucose-Capillary: 131 mg/dL — ABNORMAL HIGH (ref 70–99)
Glucose-Capillary: 147 mg/dL — ABNORMAL HIGH (ref 70–99)
Glucose-Capillary: 156 mg/dL — ABNORMAL HIGH (ref 70–99)
Glucose-Capillary: 97 mg/dL (ref 70–99)

## 2023-04-05 LAB — CULTURE, BLOOD (ROUTINE X 2): Culture: NO GROWTH

## 2023-04-05 LAB — COMPREHENSIVE METABOLIC PANEL
ALT: 26 U/L (ref 0–44)
AST: 21 U/L (ref 15–41)
Albumin: 2.3 g/dL — ABNORMAL LOW (ref 3.5–5.0)
Alkaline Phosphatase: 119 U/L (ref 38–126)
Anion gap: 11 (ref 5–15)
BUN: 13 mg/dL (ref 8–23)
CO2: 22 mmol/L (ref 22–32)
Calcium: 8.4 mg/dL — ABNORMAL LOW (ref 8.9–10.3)
Chloride: 102 mmol/L (ref 98–111)
Creatinine, Ser: 0.87 mg/dL (ref 0.61–1.24)
GFR, Estimated: >60 mL/min (ref >60)
Glucose, Bld: 127 mg/dL — ABNORMAL HIGH (ref 70–99)
Potassium: 4.2 mmol/L (ref 3.5–5.1)
Sodium: 135 mmol/L (ref 135–145)
Total Bilirubin: 0.9 mg/dL (ref 0.3–1.2)
Total Protein: 6.9 g/dL (ref 6.5–8.1)

## 2023-04-05 LAB — MAGNESIUM: Magnesium: 1.9 mg/dL (ref 1.7–2.4)

## 2023-04-05 LAB — PHOSPHORUS: Phosphorus: 2.9 mg/dL (ref 2.5–4.6)

## 2023-04-05 MED ORDER — FLUTICASONE PROPIONATE 50 MCG/ACT NA SUSP
1.0000 | Freq: Every day | NASAL | Status: DC
  Administered 2023-04-05 – 2023-04-08 (×3): 1 via NASAL
  Filled 2023-04-05: qty 16

## 2023-04-05 MED ORDER — VANCOMYCIN HCL 1500 MG/300ML IV SOLN: 1500.0000 mg | INTRAVENOUS | Status: DC

## 2023-04-05 MED ORDER — TRANEXAMIC ACID-NACL 1000-0.7 MG/100ML-% IV SOLN
1000.0000 mg | INTRAVENOUS | Status: AC
  Administered 2023-04-06: 1000 mg via INTRAVENOUS
  Filled 2023-04-05: qty 100

## 2023-04-05 MED ORDER — VANCOMYCIN HCL 1500 MG/300ML IV SOLN
1500.0000 mg | INTRAVENOUS | Status: DC
  Filled 2023-04-05: qty 300

## 2023-04-05 MED ORDER — CEFAZOLIN IN SODIUM CHLORIDE 3-0.9 GM/100ML-% IV SOLN
3.0000 g | INTRAVENOUS | Status: AC
  Administered 2023-04-06: 3 g via INTRAVENOUS
  Filled 2023-04-05: qty 100

## 2023-04-05 MED ORDER — POVIDONE-IODINE 10 % EX SWAB
2.0000 | Freq: Once | CUTANEOUS | Status: AC
  Administered 2023-04-05: 2 via TOPICAL

## 2023-04-05 MED ORDER — CHLORHEXIDINE GLUCONATE 4 % EX SOLN
60.0000 mL | Freq: Once | CUTANEOUS | Status: AC
  Administered 2023-04-06: 4 via TOPICAL
  Filled 2023-04-05: qty 60

## 2023-04-05 MED ORDER — TRANEXAMIC ACID 1000 MG/10ML IV SOLN
2000.0000 mg | INTRAVENOUS | Status: DC
  Filled 2023-04-05: qty 20

## 2023-04-05 NOTE — Progress Notes (Addendum)
Physical Therapy Treatment Patient Details Name: [name] MRN: [id number] DOB: 04/12/1961 Today's Date: 04/05/2023   History of Present Illness 62 y.o. male admitted 6/13 with Infected Left Diabetic Foot Wound/Ulcer. S/p left transmetatarsal amputation 6/14. PMHx:  Sepsis  L diabetic foot infection/ulcer, possible early osteomyelitis, septic joint arthritis with infected tenosynovitis, HTN, obesity, HLD, prediabetes, alcoholic cirrhosis, alcohol abuse.    PT Comments    Patient received in bed, he is agreeable to PT session. Patient is mod I with bed mobility. Required assistance to don post op shoe and sneaker on R foot. Patient stands from elevated bed with min guard. He is able to ambulate 22 feet with RW and min guard. No lob. Patient just fatigued with this. He will continue to benefit from skilled PT to improve functional independence and safety with mobility.  Patient is scheduled for BKA tomorrow, 04/06/23.    Recommendations for follow up therapy are one component of a multi-disciplinary discharge planning process, led by the attending physician.  Recommendations may be updated based on patient status, additional functional criteria and insurance authorization.  Follow Up Recommendations       Assistance Recommended at Discharge Intermittent Supervision/Assistance  Patient can return home with the following A little help with walking and/or transfers;A little help with bathing/dressing/bathroom;Assistance with cooking/housework;Assist for transportation;Help with stairs or ramp for entrance   Equipment Recommendations  None recommended by PT    Recommendations for Other Services Rehab consult     Precautions / Restrictions Precautions Precautions: Fall Other Brace: post op shoe Restrictions Weight Bearing Restrictions: Yes LLE Weight Bearing: Non weight bearing     Mobility  Bed Mobility Overal bed mobility: Modified Independent Bed Mobility: Supine to Sit, Sit to  Supine     Supine to sit: Modified independent (Device/Increase time) Sit to supine: Modified independent (Device/Increase time)        Transfers Overall transfer level: Modified independent Equipment used: Rolling walker (2 wheels) Transfers: Sit to/from Stand Sit to Stand: Modified independent (Device/Increase time), From elevated surface                Ambulation/Gait Ambulation/Gait assistance: Min guard Gait Distance (Feet): 22 Feet Assistive device: Rolling walker (2 wheels) Gait Pattern/deviations: Step-to pattern Gait velocity: decr     General Gait Details: No LOB, cues for awareness. Safely maintains NWB on LLE, increased effort with fatigue.   Stairs             Wheelchair Mobility    Modified Rankin (Stroke Patients Only)       Balance Overall balance assessment: Needs assistance Sitting-balance support: Feet supported Sitting balance-Leahy Scale: Normal     Standing balance support: Bilateral upper extremity supported, During functional activity, Reliant on assistive device for balance Standing balance-Leahy Scale: Fair Standing balance comment: needs RW support                            Cognition Arousal/Alertness: Awake/alert Behavior During Therapy: WFL for tasks assessed/performed Overall Cognitive Status: Within Functional Limits for tasks assessed                                          Exercises      General Comments        Pertinent Vitals/Pain Pain Assessment Pain Assessment: No/denies pain    Home Living  Prior Function            PT Goals (current goals can now be found in the care plan section) Acute Rehab PT Goals Patient Stated Goal: Get well PT Goal Formulation: With patient Time For Goal Achievement: 04/16/23 Potential to Achieve Goals: Good Progress towards PT goals: Progressing toward goals    Frequency    Min 4X/week       PT Plan Current plan remains appropriate    Co-evaluation              AM-PAC PT "6 Clicks" Mobility   Outcome Measure  Help needed turning from your back to your side while in a flat bed without using bedrails?: None Help needed moving from lying on your back to sitting on the side of a flat bed without using bedrails?: None Help needed moving to and from a bed to a chair (including a wheelchair)?: A Little Help needed standing up from a chair using your arms (e.g., wheelchair or bedside chair)?: A Little Help needed to walk in hospital room?: A Little Help needed climbing 3-5 steps with a railing? : A Lot 6 Click Score: 19    End of Session Equipment Utilized During Treatment: Gait belt Activity Tolerance: Patient tolerated treatment well;Patient limited by fatigue Patient left: in bed;with call bell/phone within reach Nurse Communication: Mobility status PT Visit Diagnosis: Muscle weakness (generalized) (M62.81);Difficulty in walking, not elsewhere classified (R26.2)     Time: 1610-9604 PT Time Calculation (min) (ACUTE ONLY): 10 min  Charges:  $Gait Training: 8-22 mins                     Jahniyah Revere, PT, GCS 04/05/23,1:11 PM

## 2023-04-05 NOTE — Progress Notes (Signed)
PROGRESS NOTE    [name]  ZOX:[id number] DOB: 1961/08/04 DOA: 03/31/2023 PCP: [name], MD   Brief Narrative:  The patient is a 62 year old obese Caucasian male with a past medical history significant for benign to chronic systolic and diastolic CHF, diabetes mellitus type 2 which is now diet controlled and improved significantly, with history of recurrent diabetic foot wounds, essential hypertension, hyperlipidemia, anemia of chronic disease with baseline hemoglobin of 10-12 and other comorbidities who presents from the podiatry office for a left foot wound.  Over the last several weeks he is reported worsening of the wound on the dorsal surface of the left foot over the first toe and now after 7 to 10 days he had progressive erythema, swelling tenderness and warmth over the dorsal aspect of the left midfoot associated with some purulent drainage.  He denies any trauma and states that he is neuropathy and has really no feeling in his feet.    He presented to his podiatry office who is very concerned and sent the patient over to the ED for further evaluation and workup and he was admitted for his diabetic foot wound and podiatry and infectious diseases been consulted.    Given his MRI findings the infectious disease doctor recommended a TMA and patient was taken for TMA on 04/01/23 but podiatry feels that he may not heal properly and recommending probably definitive surgery with a BKA so Ortho was consulted to discuss BKA option versus repeat washout and potential closure. Orthopedic Surgery recommended transtibial amputation and patient is agreeable and plan is for surgical intervention on Wednesday, 04/06/2023.  Assessment and Plan:  Infected Left Diabetic Food Wound/Ulcer -Ulcerative lesion associated on the dorsal aspect of the left first toe with surrounding erythema, and tenderness warmth swelling and some redness.  No drainage with extension these findings approximately over the  dorsal aspect of the left midfoot -Plain foot x-ray showed interval development of severe soft tissue swelling and absence of any evidence of subcutaneous gas -MRI of the foot done and showed "Small first MTP joint effusion with synovitis and subchondral marrow edema concerning for septic arthritis. Severe bone marrow edema in the fifth metatarsal head and shaft concerning for osteomyelitis. Soft tissue edema along the medial aspect of the forefoot concerning for cellulitis. Complex fluid collection along the medial aspect of the first metatarsal measuring 1.5 x 1.8 x 2.7 cm consistent with an abscess. Small abscess along the medial aspect of the first metatarsal head measuring 1.4 x 0.2 cm. 10 mm abscess in the soft tissues between the first and second metatarsal heads  -Was placed on IV vancomycin and cefepime will continue as well as IV Flagyl but now Cefepime and Flagyl being stopped given Wound Sensitivities  -CBC and inflammatory markers trend: Recent Labs  Lab 03/31/23 1610 03/31/23 2048 03/31/23 2258 04/01/23 0200 04/02/23 0214 04/03/23 0044 04/04/23 1006 04/05/23 0148  WBC 10.6*  --   --  5.1 4.4 4.4 5.7 6.3  LATICACIDVEN  --    < > 1.4  --   --   --   --   --   CRP  --   --  20.3*  --   --   --   --   --    < > = values in this interval not displayed.  -Podiatry consulted and took the patient for left transmetatarsal amputation with possible serial irrigation and debridement on 04/01/23; Podiatry feels that due to the significant amount of bone was removed  during surgery and appears to continue to have some necrosis cellulitis to his medial incision that he may need to go to the OR for repeat washout and potential closure however proximal amputation may be considered given his low likelihood of success for healing with constant surgery and wound care.  Will check ABIs (ordered but then was canceled by Dr. Lajoyce Corners) and the podiatry team recommends an Ortho Consult  -Dr. Lajoyce Corners evaluated and  recommended proceeding with a left transtibial amputation and the risks and benefits were discussed and the patient understands and wishes to proceed with the surgery with the plan for surgical intervention on Wednesday, 04/06/2023; Dr. Lajoyce Corners is placing order for uric acid level and type and screen and transfusion with 1 unit of PRBCs -Infectious Diseases consulted for further evaluation recommendations and they are recommending continue broad-spectrum antibiotics for next 48 hours and discussing oral regimen to do afterwards pending culture studies however given that the patient has elected for transtibial amputation may not need antibiotics and per ID once BKA is done we can stop all antibiotic.   -Wound Cx: Gram Stain NO WBC SEEN NO ORGANISMS SEEN Performed at [facility], [address], [place], [place] [address]  Culture RARE METHICILLIN RESISTANT STAPHYLOCOCCUS AUREUS RARE CORYNEBACTERIUM STRIATUM Standardized susceptibility testing for this organism is not available. NO ANAEROBES ISOLATED; CULTURE IN PROGRESS FOR 5 DAYS  Report Status PENDING  Organism ID, Bacteria METHICILLIN RESISTANT STAPHYLOCOCCUS AUREUS  Resulting Agency [facility]     Susceptibility   Methicillin resistant staphylococcus aureus    MIC    CIPROFLOXACIN 4 RESISTANT Resistant    CLINDAMYCIN <=0.25 SENS... Sensitive    ERYTHROMYCIN >=8 RESISTANT Resistant    GENTAMICIN <=0.5 SENSI... Sensitive    Inducible Clindamycin NEGATIVE Sensitive    LINEZOLID 2 SENSITIVE Sensitive    OXACILLIN >=4 RESISTANT Resistant    RIFAMPIN <=0.5 SENSI... Sensitive    TETRACYCLINE <=1 SENSITIVE Sensitive    TRIMETH/SULFA >=320 RESIS... Resistant    VANCOMYCIN 1 SENSITIVE Sensitive         -Continue with pain control with IV Hydromorphone 0.5 mg every 2 as needed for severe pain, Acetaminophen 650 mg p.o./RC every 6 as needed for mild pain or fever -Weightbearing status per Podiatry and will need PT OT to further evaluate  and treat  Anion Gap Metabolic Acidosis -In the setting of Foot Infection -Lactic Acid Level Trend: Recent Labs  Lab 03/31/23 2048 03/31/23 2258  LATICACIDVEN 1.0 1.4  -Patient's CO2 is now improved and CO2 is 22, AG is 11, and Chloride Level is 102 -Started Sodium Bicarbonate Tabs 650 mg po BID and will stop today  -Continue To monitor and trend and repeat CMP in a.m.  Hypokalemia -Patient's K+ Level Trend: Recent Labs  Lab 03/31/23 1610 04/01/23 0200 04/02/23 0214 04/03/23 0044 04/04/23 1006 04/05/23 0148  K 3.4* 3.6 3.7 3.4* 4.3 4.2  -Replete with po Kcl 40 mEQ BID x2 -Continue to Monitor and Replete as Necessary -Repeat CMP in the AM   Hypomagnesemia -Patient's Mag Level Trend: Recent Labs  Lab 03/31/23 2258 04/01/23 0200 04/02/23 0214 04/03/23 0044 04/04/23 1006 04/05/23 0148  MG 2.1 1.9 1.8 1.6* 1.7 1.9  -Replete with IV Mag Sulfate 2 grams again -Continue to Monitor and Replete as Necessary -Repeat Mag in the AM   Hyponatremia -Na+ Trend: Recent Labs  Lab 03/31/23 1610 04/01/23 0200 04/02/23 0214 04/03/23 0044 04/04/23 1006 04/05/23 0148  NA 135 133* 135 130* 137 135  -  Continue to Monitor and Trend and repeat CMP in the AM  Chronic Combined Systolic and Diastolic CHF -Most Recent ECHO in January 2023 showed EF of 45-50% and Grade 3DD -Currently appears Euvolemic and no clinical evidence to suggest Acute Decompensation -Takes PRN Lasix at Home and Metoprolol Succinate -Strict I's and O's and Daily Weights;  Intake/Output Summary (Last 24 hours) at 04/05/2023 1148 Last data filed at 04/05/2023 1100 Gross per 24 hour  Intake 1582.27 ml  Output 1351 ml  Net 231.27 ml  -Resumed Metoprolol Succinate 25 mg po Daily  -Continue to monitor for signs and symptoms of volume overload  Diabetes Mellitus Type 2, now Diet controlled -Well controlled and repeat HbA1c was 5.4 -Has a Hx of Diabetes Mellitus type 2 -Not on any Insulin or Oral Hypoglycemics  as an outpatient  -Placed on q6h CBGs with Sensitive Novolog SSI  -Continue to Monitor CBG's per Protocol -Glucose and CBG Trend:  Recent Labs  Lab 03/31/23 1610 04/01/23 0020 04/01/23 0200 04/01/23 0613 04/02/23 0214 04/02/23 0543 04/03/23 0044 04/03/23 0631 04/04/23 1006 04/04/23 1200 04/05/23 0148 04/05/23 0551 04/05/23 1120  GLUCOSE 106*  --  174*  --  139*  --  162*  --  121*  --  127*  --   --   GLUCAP  --    < >  --    < >  --    < >  --    < >  --    < >  --    < > 147*   < > = values in this interval not displayed.    Essential HTN -Home Meds include Metoprolol Succinate and Amlodipine which have been resumed -Continue to Monitor BP per Protocol -Last BP Reading was 142/81  HLD -On Atorvastatin as an outpatient and resumed now  Constipation, improved  -Bowel regimen as needed and have initiated with MiraLAX 17 g p.o. daily as needed and senna docusate 1 tab p.o. nightly  Anemia of Chronic Disease -Has a Baseline Hgb ranging from 10-12 -Hgb/Hct Trend: Recent Labs  Lab 03/31/23 1610 04/01/23 0200 04/02/23 0214 04/03/23 0044 04/03/23 1946 04/04/23 1006 04/05/23 0148  HGB 12.2* 10.2* 9.4* 8.2* 9.5* 10.4* 9.3*  HCT 36.8* 30.7* 29.1* 25.1* 28.8* 30.5* 28.4*  MCV 90.6 89.8 91.8 90.6  --  90.0 91.0  -Checked Anemia Panel and showed an iron level of 31, UIBC of 260, TIBC of 291, saturation ratios of 11%, ferritin level 130, folate of 33.1, vitamin B12 level of 2029 -He is being typed and screened and transfused 1 unit PRBCs per Orthopedic Surgery -Continue to Monitor for S/Sx of Bleeding; No overt bleeding noted -Repeat CBC in the AM   Hypoalbuminemia -Patient's Albumin Trend: Recent Labs  Lab 03/31/23 1610 04/01/23 0200 04/02/23 0214 04/03/23 0044 04/04/23 1006 04/05/23 0148  ALBUMIN 2.9* 2.3* 2.4* 2.0* 2.4* 2.3*  -Continue to Monitor and Trend and repeat CMP in the AM  Obesity -Complicates overall prognosis and care -Estimated body mass index is  37.11 kg/m as calculated from the following:   Height as of this encounter: 6' (1.829 m).   Weight as of this encounter: 124.1 kg.  -Weight Loss and Dietary Counseling given   DVT prophylaxis: SCDs Start: 03/31/23 2000    Code Status: Full Code Family Communication: No family currently at bedside  Disposition Plan:  Level of care: Telemetry Medical Status is: Inpatient Remains inpatient appropriate because: Is undergoing surgical amputation on Wednesday   Consultants:  Podiatry  Infectious Diseases Orthopedic Surgery   Procedures:  1. Left transmetatarsal amputation possible serial irrigation and debrideiment   Antimicrobials:  Anti-infectives (From admission, onward)    Start     Dose/Rate Route Frequency Ordered Stop   04/03/23 2200  vancomycin (VANCOREADY) IVPB 1500 mg/300 mL        1,500 mg 150 mL/hr over 120 Minutes Intravenous Every 12 hours 04/03/23 1240     04/01/23 1000  vancomycin (VANCOCIN) IVPB 1000 mg/200 mL premix  Status:  Discontinued        1,000 mg 200 mL/hr over 60 Minutes Intravenous Every 12 hours 03/31/23 2029 04/03/23 1240   03/31/23 2015  vancomycin (VANCOREADY) IVPB 2000 mg/400 mL        2,000 mg 200 mL/hr over 120 Minutes Intravenous  Once 03/31/23 2008 04/01/23 0412   03/31/23 2015  ceFEPIme (MAXIPIME) 2 g in sodium chloride 0.9 % 100 mL IVPB  Status:  Discontinued        2 g 200 mL/hr over 30 Minutes Intravenous Every 8 hours 03/31/23 2008 04/04/23 1146   03/31/23 2015  metroNIDAZOLE (FLAGYL) IVPB 500 mg  Status:  Discontinued        500 mg 100 mL/hr over 60 Minutes Intravenous Every 12 hours 03/31/23 2008 04/04/23 1146   03/31/23 1945  vancomycin (VANCOCIN) IVPB 1000 mg/200 mL premix  Status:  Discontinued        1,000 mg 200 mL/hr over 60 Minutes Intravenous  Once 03/31/23 1933 03/31/23 2008   03/31/23 1945  ceFEPIme (MAXIPIME) 2 g in sodium chloride 0.9 % 100 mL IVPB  Status:  Discontinued        2 g 200 mL/hr over 30 Minutes Intravenous   Once 03/31/23 1933 03/31/23 2008       Subjective: Seen and examined at bedside he is doing fairly wants and resting.  States he fine her bowel movement.  No nausea or vomiting.  Denies any pain.  No other concerns or complaints this time.  Objective: Vitals:   04/04/23 1257 04/04/23 2046 04/05/23 0545 04/05/23 0828  BP: (!) 155/87 (!) 143/74 134/83 (!) 142/81  Pulse: 84 79 78 76  Resp: 18 20 20 18   Temp: 97.8 F (36.6 C) 98.4 F (36.9 C) 97.8 F (36.6 C) 97.8 F (36.6 C)  TempSrc: Oral Oral Oral Oral  SpO2: 98% 99% 92% 96%  Weight:   124.1 kg   Height:        Intake/Output Summary (Last 24 hours) at 04/05/2023 1149 Last data filed at 04/05/2023 1100 Gross per 24 hour  Intake 1582.27 ml  Output 1351 ml  Net 231.27 ml   Filed Weights   03/31/23 1603 04/05/23 0545  Weight: 126.3 kg 124.1 kg   Examination: Physical Exam:  Constitutional: WN/WD obese Caucasian male in no acute distress Respiratory: Diminished to auscultation bilaterally, no wheezing, rales, rhonchi or crackles. Normal respiratory effort and patient is not tachypenic. No accessory muscle use.  Unlabored breathing Cardiovascular: RRR, no murmurs / rubs / gallops. S1 and S2 auscultated.  Left foot is wrapped  Abdomen: Soft, non-tender, distended secondary to body habitus. Bowel sounds positive.  GU: Deferred. Musculoskeletal: No clubbing / cyanosis of digits/nails.  Has a TMA in his left foot is wrapped Skin: No rashes, lesions, ulcers and has a left TMA. No induration; Warm and dry.  Neurologic: CN 2-12 grossly intact with no focal deficits. Romberg sign and cerebellar reflexes not assessed.  Psychiatric: Normal judgment and insight. Alert and oriented  x 3. Normal mood and appropriate affect.   Data Reviewed: I have personally reviewed following labs and imaging studies  CBC: Recent Labs  Lab 04/01/23 0200 04/02/23 0214 04/03/23 0044 04/03/23 1946 04/04/23 1006 04/05/23 0148  WBC 5.1 4.4 4.4  --   5.7 6.3  NEUTROABS 3.9 2.7 2.6  --  3.7 4.1  HGB 10.2* 9.4* 8.2* 9.5* 10.4* 9.3*  HCT 30.7* 29.1* 25.1* 28.8* 30.5* 28.4*  MCV 89.8 91.8 90.6  --  90.0 91.0  PLT 172 188 163  --  180 207   Basic Metabolic Panel: Recent Labs  Lab 04/01/23 0200 04/02/23 0214 04/03/23 0044 04/04/23 1006 04/05/23 0148  NA 133* 135 130* 137 135  K 3.6 3.7 3.4* 4.3 4.2  CL 103 102 102 104 102  CO2 20* 22 20* 18* 22  GLUCOSE 174* 139* 162* 121* 127*  BUN 23 20 18 12 13   CREATININE 0.99 0.82 0.74 0.79 0.87  CALCIUM 8.9 8.8* 8.3* 8.7* 8.4*  MG 1.9 1.8 1.6* 1.7 1.9  PHOS  --  4.1 4.2 2.7 2.9   GFR: Estimated Creatinine Clearance: 121.3 mL/min (by C-G formula based on SCr of 0.87 mg/dL). Liver Function Tests: Recent Labs  Lab 04/01/23 0200 04/02/23 0214 04/03/23 0044 04/04/23 1006 04/05/23 0148  AST 26 29 24 30 21   ALT 34 34 30 27 26   ALKPHOS 153* 152* 143* 130* 119  BILITOT 0.9 0.7 0.6 0.9 0.9  PROT 6.7 7.0 6.1* 7.2 6.9  ALBUMIN 2.3* 2.4* 2.0* 2.4* 2.3*   No results for input(s): "LIPASE", "AMYLASE" in the last 168 hours. No results for input(s): "AMMONIA" in the last 168 hours. Coagulation Profile: Recent Labs  Lab 04/01/23 0200  INR 1.2   Cardiac Enzymes: Recent Labs  Lab 03/31/23 2258  CKTOTAL 43*   BNP (last 3 results) No results for input(s): "PROBNP" in the last 8760 hours. HbA1C: No results for input(s): "HGBA1C" in the last 72 hours. CBG: Recent Labs  Lab 04/04/23 1551 04/05/23 0014 04/05/23 0551 04/05/23 0826 04/05/23 1120  GLUCAP 138* 120* 120* 115* 147*   Lipid Profile: No results for input(s): "CHOL", "HDL", "LDLCALC", "TRIG", "CHOLHDL", "LDLDIRECT" in the last 72 hours. Thyroid Function Tests: No results for input(s): "TSH", "T4TOTAL", "FREET4", "T3FREE", "THYROIDAB" in the last 72 hours. Anemia Panel: No results for input(s): "VITAMINB12", "FOLATE", "FERRITIN", "TIBC", "IRON", "RETICCTPCT" in the last 72 hours. Sepsis Labs: Recent Labs  Lab  03/31/23 2048 03/31/23 2258  LATICACIDVEN 1.0 1.4   Recent Results (from the past 240 hour(s))  Blood culture (routine x 2)     Status: None   Collection Time: 03/31/23  8:48 PM   Specimen: BLOOD  Result Value Ref Range Status   Specimen Description BLOOD BLOOD LEFT FOREARM  Final   Special Requests   Final    BOTTLES DRAWN AEROBIC AND ANAEROBIC Blood Culture adequate volume   Culture   Final    NO GROWTH 5 DAYS Performed at [facility], [address] [place], [place] [address]    Report Status 04/05/2023 FINAL  Final  Blood culture (routine x 2)     Status: None   Collection Time: 03/31/23 10:58 PM   Specimen: BLOOD RIGHT ARM  Result Value Ref Range Status   Specimen Description BLOOD RIGHT ARM  Final   Special Requests   Final    BOTTLES DRAWN AEROBIC AND ANAEROBIC Blood Culture adequate volume   Culture   Final    NO GROWTH 5  DAYS Performed at [facility], [address] [place], [place] [address]    Report Status 04/05/2023 FINAL  Final  Surgical pcr screen     Status: Abnormal   Collection Time: 04/01/23 12:40 AM   Specimen: Nasal Mucosa; Nasal Swab  Result Value Ref Range Status   MRSA, PCR POSITIVE (A) NEGATIVE Final    Comment: RESULT CALLED TO, READ BACK BY AND VERIFIED WITH: A. PETTIFORD RN 04/01/23 @ 0223 BY AB    Staphylococcus aureus POSITIVE (A) NEGATIVE Final    Comment: (NOTE) The Xpert SA Assay (FDA approved for NASAL specimens in patients 57 years of age and older), is one component of a comprehensive surveillance program. It is not intended to diagnose infection nor to guide or monitor treatment. Performed at [facility], [address], [place], [place] [address]   Aerobic/Anaerobic Culture w Gram Stain (surgical/deep wound)     Status: None (Preliminary result)   Collection Time: 04/01/23  1:37 PM   Specimen: Path Tissue  Result Value Ref Range Status   Specimen Description WOUND  Final   Special Requests RESIDUAL FIRST  METATARSEL  Final   Gram Stain   Final    NO WBC SEEN NO ORGANISMS SEEN Performed at [facility], [address], [place], [place] [address]    Culture   Final    RARE METHICILLIN RESISTANT STAPHYLOCOCCUS AUREUS RARE CORYNEBACTERIUM STRIATUM Standardized susceptibility testing for this organism is not available. NO ANAEROBES ISOLATED; CULTURE IN PROGRESS FOR 5 DAYS    Report Status PENDING  Incomplete   Organism ID, Bacteria METHICILLIN RESISTANT STAPHYLOCOCCUS AUREUS  Final      Susceptibility   Methicillin resistant staphylococcus aureus - MIC*    CIPROFLOXACIN 4 RESISTANT Resistant     ERYTHROMYCIN >=8 RESISTANT Resistant     GENTAMICIN <=0.5 SENSITIVE Sensitive     OXACILLIN >=4 RESISTANT Resistant     TETRACYCLINE <=1 SENSITIVE Sensitive     VANCOMYCIN 1 SENSITIVE Sensitive     TRIMETH/SULFA >=320 RESISTANT Resistant     CLINDAMYCIN <=0.25 SENSITIVE Sensitive     RIFAMPIN <=0.5 SENSITIVE Sensitive     Inducible Clindamycin NEGATIVE Sensitive     LINEZOLID 2 SENSITIVE Sensitive     * RARE METHICILLIN RESISTANT STAPHYLOCOCCUS AUREUS    Radiology Studies: No results found.  Scheduled Meds:  amLODipine  5 mg Oral Daily   atorvastatin  80 mg Oral Daily   folic acid  1 mg Oral Daily   insulin aspart  0-9 Units Subcutaneous Q6H   metoprolol succinate  25 mg Oral Daily   thiamine  100 mg Oral Daily   Continuous Infusions:  vancomycin 1,500 mg (04/05/23 0831)    LOS: 5 days   Marguerita Merles, DO [facility] Available via Epic secure chat 7am-7pm After these hours, please refer to coverage provider listed on [email or web address] 04/05/2023, 11:49 AM

## 2023-04-05 NOTE — Plan of Care (Signed)

## 2023-04-06 ENCOUNTER — Inpatient Hospital Stay (HOSPITAL_COMMUNITY): Payer: Medicare Other | Admitting: Certified Registered"

## 2023-04-06 ENCOUNTER — Encounter (HOSPITAL_COMMUNITY)
Admission: EM | Disposition: A | Discharge status: Rehabilitation Facility | Payer: Self-pay | Source: Ambulatory Visit | Attending: Internal Medicine

## 2023-04-06 ENCOUNTER — Encounter (HOSPITAL_COMMUNITY): Payer: Self-pay | Admitting: Internal Medicine

## 2023-04-06 ENCOUNTER — Other Ambulatory Visit: Payer: Self-pay

## 2023-04-06 DIAGNOSIS — E782 Mixed hyperlipidemia: Secondary | ICD-10-CM | POA: Diagnosis not present

## 2023-04-06 DIAGNOSIS — M869 Osteomyelitis, unspecified: Secondary | ICD-10-CM

## 2023-04-06 DIAGNOSIS — I110 Hypertensive heart disease with heart failure: Secondary | ICD-10-CM

## 2023-04-06 DIAGNOSIS — Z87891 Personal history of nicotine dependence: Secondary | ICD-10-CM

## 2023-04-06 DIAGNOSIS — E1169 Type 2 diabetes mellitus with other specified complication: Secondary | ICD-10-CM

## 2023-04-06 DIAGNOSIS — E1151 Type 2 diabetes mellitus with diabetic peripheral angiopathy without gangrene: Secondary | ICD-10-CM

## 2023-04-06 DIAGNOSIS — S91332A Puncture wound without foreign body, left foot, initial encounter: Secondary | ICD-10-CM | POA: Diagnosis not present

## 2023-04-06 DIAGNOSIS — L089 Local infection of the skin and subcutaneous tissue, unspecified: Secondary | ICD-10-CM | POA: Diagnosis not present

## 2023-04-06 DIAGNOSIS — I5042 Chronic combined systolic (congestive) and diastolic (congestive) heart failure: Secondary | ICD-10-CM | POA: Diagnosis not present

## 2023-04-06 DIAGNOSIS — I509 Heart failure, unspecified: Secondary | ICD-10-CM

## 2023-04-06 HISTORY — PX: AMPUTATION: SHX166

## 2023-04-06 LAB — GLUCOSE, CAPILLARY
Glucose-Capillary: 122 mg/dL — ABNORMAL HIGH (ref 70–99)
Glucose-Capillary: 131 mg/dL — ABNORMAL HIGH (ref 70–99)
Glucose-Capillary: 118 mg/dL — ABNORMAL HIGH (ref 70–99)
Glucose-Capillary: 130 mg/dL — ABNORMAL HIGH (ref 70–99)
Glucose-Capillary: 117 mg/dL — ABNORMAL HIGH (ref 70–99)
Glucose-Capillary: 129 mg/dL — ABNORMAL HIGH (ref 70–99)

## 2023-04-06 LAB — CBC WITH DIFFERENTIAL/PLATELET
Abs Immature Granulocytes: 0.21 10*3/uL — ABNORMAL HIGH (ref 0.00–0.07)
Basophils Absolute: 0 10*3/uL (ref 0.0–0.1)
Basophils Relative: 0 %
Eosinophils Absolute: 0.1 10*3/uL (ref 0.0–0.5)
Eosinophils Relative: 2 %
HCT: 28.4 % — ABNORMAL LOW (ref 39.0–52.0)
Hemoglobin: 9.2 g/dL — ABNORMAL LOW (ref 13.0–17.0)
Immature Granulocytes: 4 %
Lymphocytes Relative: 27 %
Lymphs Abs: 1.5 10*3/uL (ref 0.7–4.0)
MCH: 29.6 pg (ref 26.0–34.0)
MCHC: 32.4 g/dL (ref 30.0–36.0)
MCV: 91.3 fL (ref 80.0–100.0)
Monocytes Absolute: 0.3 10*3/uL (ref 0.1–1.0)
Monocytes Relative: 6 %
Neutro Abs: 3.4 10*3/uL (ref 1.7–7.7)
Neutrophils Relative %: 61 %
Platelets: 220 10*3/uL (ref 150–400)
RBC: 3.11 MIL/uL — ABNORMAL LOW (ref 4.22–5.81)
RDW: 14.7 % (ref 11.5–15.5)
WBC: 5.6 10*3/uL (ref 4.0–10.5)
nRBC: 0 % (ref 0.0–0.2)

## 2023-04-06 LAB — AEROBIC/ANAEROBIC CULTURE W GRAM STAIN (SURGICAL/DEEP WOUND): Gram Stain: NONE SEEN

## 2023-04-06 LAB — BASIC METABOLIC PANEL
Anion gap: 9 (ref 5–15)
BUN: 11 mg/dL (ref 8–23)
CO2: 20 mmol/L — ABNORMAL LOW (ref 22–32)
Calcium: 8.1 mg/dL — ABNORMAL LOW (ref 8.9–10.3)
Chloride: 107 mmol/L (ref 98–111)
Creatinine, Ser: 0.65 mg/dL (ref 0.61–1.24)
GFR, Estimated: >60 mL/min (ref >60)
Glucose, Bld: 119 mg/dL — ABNORMAL HIGH (ref 70–99)
Potassium: 3.8 mmol/L (ref 3.5–5.1)
Sodium: 136 mmol/L (ref 135–145)

## 2023-04-06 LAB — MAGNESIUM: Magnesium: 1.6 mg/dL — ABNORMAL LOW (ref 1.7–2.4)

## 2023-04-06 SURGERY — AMPUTATION BELOW KNEE
Anesthesia: General | Site: Knee | Laterality: Left

## 2023-04-06 MED ORDER — SODIUM CHLORIDE 0.9 % IV SOLN: INTRAVENOUS | Status: DC

## 2023-04-06 MED ORDER — VITAMIN C 500 MG PO TABS
1000.0000 mg | ORAL_TABLET | Freq: Every day | ORAL | Status: DC
  Administered 2023-04-06 – 2023-04-08 (×3): 1000 mg via ORAL
  Filled 2023-04-06 (×3): qty 2

## 2023-04-06 MED ORDER — FENTANYL CITRATE (PF) 250 MCG/5ML IJ SOLN
INTRAMUSCULAR | Status: AC
  Filled 2023-04-06: qty 5

## 2023-04-06 MED ORDER — MIDAZOLAM HCL 2 MG/2ML IJ SOLN
INTRAMUSCULAR | Status: AC
  Filled 2023-04-06: qty 2

## 2023-04-06 MED ORDER — LIDOCAINE 2% (20 MG/ML) 5 ML SYRINGE
INTRAMUSCULAR | Status: DC | PRN
  Administered 2023-04-06: 60 mg via INTRAVENOUS

## 2023-04-06 MED ORDER — FENTANYL CITRATE (PF) 100 MCG/2ML IJ SOLN: 25.0000 ug | INTRAMUSCULAR | Status: DC | PRN

## 2023-04-06 MED ORDER — ZINC SULFATE 220 (50 ZN) MG PO CAPS
220.0000 mg | ORAL_CAPSULE | Freq: Every day | ORAL | Status: DC
  Administered 2023-04-06 – 2023-04-08 (×3): 220 mg via ORAL
  Filled 2023-04-06 (×3): qty 1

## 2023-04-06 MED ORDER — ALUM & MAG HYDROXIDE-SIMETH 200-200-20 MG/5ML PO SUSP: 15.0000 mL | ORAL | Status: DC | PRN

## 2023-04-06 MED ORDER — OXYCODONE HCL 5 MG PO TABS
10.0000 mg | ORAL_TABLET | ORAL | Status: DC | PRN
  Administered 2023-04-06 – 2023-04-07 (×4): 15 mg via ORAL
  Filled 2023-04-06 (×4): qty 3

## 2023-04-06 MED ORDER — CHLORHEXIDINE GLUCONATE 0.12 % MT SOLN
15.0000 mL | Freq: Once | OROMUCOSAL | Status: AC
  Administered 2023-04-06: 15 mL via OROMUCOSAL

## 2023-04-06 MED ORDER — LACTATED RINGERS IV SOLN: INTRAVENOUS | Status: DC

## 2023-04-06 MED ORDER — OXYCODONE HCL 5 MG PO TABS: 5.0000 mg | ORAL_TABLET | Freq: Once | ORAL | Status: DC | PRN

## 2023-04-06 MED ORDER — POTASSIUM CHLORIDE CRYS ER 20 MEQ PO TBCR: 20.0000 meq | EXTENDED_RELEASE_TABLET | Freq: Every day | ORAL | Status: DC | PRN

## 2023-04-06 MED ORDER — ACETAMINOPHEN 325 MG PO TABS: 325.0000 mg | ORAL_TABLET | Freq: Four times a day (QID) | ORAL | Status: DC | PRN

## 2023-04-06 MED ORDER — ORAL CARE MOUTH RINSE: 15.0000 mL | Freq: Once | OROMUCOSAL | Status: AC

## 2023-04-06 MED ORDER — PROPOFOL 10 MG/ML IV BOLUS
INTRAVENOUS | Status: AC
  Filled 2023-04-06: qty 20

## 2023-04-06 MED ORDER — ONDANSETRON HCL 4 MG/2ML IJ SOLN
INTRAMUSCULAR | Status: AC
  Filled 2023-04-06: qty 2

## 2023-04-06 MED ORDER — JUVEN PO PACK
1.0000 | PACK | Freq: Two times a day (BID) | ORAL | Status: DC
  Administered 2023-04-06 – 2023-04-08 (×4): 1 via ORAL
  Filled 2023-04-06 (×4): qty 1

## 2023-04-06 MED ORDER — VANCOMYCIN HCL 1500 MG/300ML IV SOLN
1500.0000 mg | INTRAVENOUS | Status: AC
  Administered 2023-04-06: 1500 mg via INTRAVENOUS
  Filled 2023-04-06: qty 300

## 2023-04-06 MED ORDER — PROPOFOL 10 MG/ML IV BOLUS
INTRAVENOUS | Status: DC | PRN
  Administered 2023-04-06: 200 mg via INTRAVENOUS

## 2023-04-06 MED ORDER — MIDAZOLAM HCL 2 MG/2ML IJ SOLN
INTRAMUSCULAR | Status: AC
  Administered 2023-04-06: 1 mg via INTRAVENOUS
  Filled 2023-04-06: qty 2

## 2023-04-06 MED ORDER — BUPIVACAINE HCL (PF) 0.5 % IJ SOLN
INTRAMUSCULAR | Status: DC | PRN
  Administered 2023-04-06: 25 mL via PERINEURAL

## 2023-04-06 MED ORDER — FENTANYL CITRATE (PF) 250 MCG/5ML IJ SOLN
INTRAMUSCULAR | Status: DC | PRN
  Administered 2023-04-06: 50 ug via INTRAVENOUS

## 2023-04-06 MED ORDER — MIDAZOLAM HCL 2 MG/2ML IJ SOLN: 1.0000 mg | Freq: Once | INTRAMUSCULAR | Status: AC

## 2023-04-06 MED ORDER — DOCUSATE SODIUM 100 MG PO CAPS
100.0000 mg | ORAL_CAPSULE | Freq: Every day | ORAL | Status: DC
  Administered 2023-04-07 – 2023-04-08 (×2): 100 mg via ORAL
  Filled 2023-04-06 (×2): qty 1

## 2023-04-06 MED ORDER — OXYCODONE HCL 5 MG PO TABS
5.0000 mg | ORAL_TABLET | ORAL | Status: DC | PRN
  Administered 2023-04-08: 5 mg via ORAL
  Filled 2023-04-06: qty 1

## 2023-04-06 MED ORDER — ONDANSETRON HCL 4 MG/2ML IJ SOLN: 4.0000 mg | Freq: Four times a day (QID) | INTRAMUSCULAR | Status: DC | PRN

## 2023-04-06 MED ORDER — 0.9 % SODIUM CHLORIDE (POUR BTL) OPTIME
TOPICAL | Status: DC | PRN
  Administered 2023-04-06: 1000 mL

## 2023-04-06 MED ORDER — MAGNESIUM SULFATE 2 GM/50ML IV SOLN: 2.0000 g | Freq: Every day | INTRAVENOUS | Status: DC | PRN

## 2023-04-06 MED ORDER — METOPROLOL TARTRATE 5 MG/5ML IV SOLN: 2.0000 mg | INTRAVENOUS | Status: DC | PRN

## 2023-04-06 MED ORDER — POLYETHYLENE GLYCOL 3350 17 G PO PACK: 17.0000 g | PACK | Freq: Every day | ORAL | Status: DC | PRN

## 2023-04-06 MED ORDER — MAGNESIUM SULFATE 4 GM/100ML IV SOLN: 4.0000 g | Freq: Every day | INTRAVENOUS | Status: DC | PRN

## 2023-04-06 MED ORDER — HYDRALAZINE HCL 20 MG/ML IJ SOLN: 5.0000 mg | INTRAMUSCULAR | Status: DC | PRN

## 2023-04-06 MED ORDER — GUAIFENESIN-DM 100-10 MG/5ML PO SYRP: 15.0000 mL | ORAL_SOLUTION | ORAL | Status: DC | PRN

## 2023-04-06 MED ORDER — FENTANYL CITRATE (PF) 100 MCG/2ML IJ SOLN
INTRAMUSCULAR | Status: AC
  Administered 2023-04-06: 50 ug via INTRAVENOUS
  Filled 2023-04-06: qty 2

## 2023-04-06 MED ORDER — FENTANYL CITRATE (PF) 100 MCG/2ML IJ SOLN: 50.0000 ug | Freq: Once | INTRAMUSCULAR | Status: AC

## 2023-04-06 MED ORDER — ONDANSETRON HCL 4 MG/2ML IJ SOLN
INTRAMUSCULAR | Status: DC | PRN
  Administered 2023-04-06: 4 mg via INTRAVENOUS

## 2023-04-06 MED ORDER — PANTOPRAZOLE SODIUM 40 MG PO TBEC
40.0000 mg | DELAYED_RELEASE_TABLET | Freq: Every day | ORAL | Status: DC
  Administered 2023-04-06 – 2023-04-08 (×3): 40 mg via ORAL
  Filled 2023-04-06 (×3): qty 1

## 2023-04-06 MED ORDER — INSULIN ASPART 100 UNIT/ML IJ SOLN
0.0000 [IU] | Freq: Three times a day (TID) | INTRAMUSCULAR | Status: DC
  Administered 2023-04-07 – 2023-04-08 (×5): 1 [IU] via SUBCUTANEOUS

## 2023-04-06 MED ORDER — BISACODYL 5 MG PO TBEC: 5.0000 mg | DELAYED_RELEASE_TABLET | Freq: Every day | ORAL | Status: DC | PRN

## 2023-04-06 MED ORDER — OXYCODONE HCL 5 MG/5ML PO SOLN: 5.0000 mg | Freq: Once | ORAL | Status: DC | PRN

## 2023-04-06 MED ORDER — PHENOL 1.4 % MT LIQD: 1.0000 | OROMUCOSAL | Status: DC | PRN

## 2023-04-06 MED ORDER — HYDROMORPHONE HCL 1 MG/ML IJ SOLN
0.5000 mg | INTRAMUSCULAR | Status: DC | PRN
  Administered 2023-04-06 – 2023-04-08 (×5): 1 mg via INTRAVENOUS
  Filled 2023-04-06 (×5): qty 1

## 2023-04-06 MED ORDER — LABETALOL HCL 5 MG/ML IV SOLN: 10.0000 mg | INTRAVENOUS | Status: DC | PRN

## 2023-04-06 MED ORDER — MAGNESIUM CITRATE PO SOLN: 1.0000 | Freq: Once | ORAL | Status: DC | PRN

## 2023-04-06 SURGICAL SUPPLY — 44 items
BAG COUNTER SPONGE SURGICOUNT (BAG) IMPLANT
BAG SPNG CNTER NS LX DISP (BAG)
BIT DRILL 3.2XOCPTL (BIT) ×1 IMPLANT
BIT DRL 3.2XOCPTL (BIT)
BLADE SAW RECIP 87.9 MT (BLADE) ×1 IMPLANT
BLADE SURG 21 STRL SS (BLADE) ×1 IMPLANT
BNDG CMPR 5X6 CHSV STRCH STRL (GAUZE/BANDAGES/DRESSINGS)
BNDG COHESIVE 6X5 TAN ST LF (GAUZE/BANDAGES/DRESSINGS) IMPLANT
CANISTER WOUND CARE 500ML ATS (WOUND CARE) ×1 IMPLANT
COVER SURGICAL LIGHT HANDLE (MISCELLANEOUS) ×1 IMPLANT
CUFF TOURN SGL QUICK 34 (TOURNIQUET CUFF) ×1
CUFF TRNQT CYL 34X4.125X (TOURNIQUET CUFF) ×1 IMPLANT
DRAPE DERMATAC (DRAPES) IMPLANT
DRAPE INCISE IOBAN 66X45 STRL (DRAPES) ×1 IMPLANT
DRAPE U-SHAPE 47X51 STRL (DRAPES) ×1 IMPLANT
DRESSING PREVENA PLUS CUSTOM (GAUZE/BANDAGES/DRESSINGS) ×1 IMPLANT
DRILL BIT (BIT)
DRSG PREVENA PLUS CUSTOM (GAUZE/BANDAGES/DRESSINGS) ×1
DURAPREP 26ML APPLICATOR (WOUND CARE) ×1 IMPLANT
ELECT REM PT RETURN 9FT ADLT (ELECTROSURGICAL) ×1
ELECTRODE REM PT RTRN 9FT ADLT (ELECTROSURGICAL) ×1 IMPLANT
GLOVE BIOGEL PI IND STRL 9 (GLOVE) ×1 IMPLANT
GLOVE SURG ORTHO 9.0 STRL STRW (GLOVE) ×1 IMPLANT
GOWN STRL REUS W/ TWL XL LVL3 (GOWN DISPOSABLE) ×2 IMPLANT
GOWN STRL REUS W/TWL XL LVL3 (GOWN DISPOSABLE) ×2
GRAFT SKIN WND MICRO 38 (Tissue) IMPLANT
GRAFT SKIN WND SURGIBIND 3X7 (Tissue) IMPLANT
KIT BASIN OR (CUSTOM PROCEDURE TRAY) ×1 IMPLANT
KIT TURNOVER KIT B (KITS) ×1 IMPLANT
MANIFOLD NEPTUNE II (INSTRUMENTS) ×1 IMPLANT
NS IRRIG 1000ML POUR BTL (IV SOLUTION) ×1 IMPLANT
PACK ORTHO EXTREMITY (CUSTOM PROCEDURE TRAY) ×1 IMPLANT
PAD ARMBOARD 7.5X6 YLW CONV (MISCELLANEOUS) ×1 IMPLANT
PREVENA RESTOR ARTHOFORM 46X30 (CANNISTER) ×1 IMPLANT
SPONGE T-LAP 18X18 ~~LOC~~+RFID (SPONGE) IMPLANT
STAPLER VISISTAT 35W (STAPLE) IMPLANT
STOCKINETTE IMPERVIOUS LG (DRAPES) ×1 IMPLANT
SUT ETHILON 2 0 PSLX (SUTURE) IMPLANT
SUT SILK 2 0 (SUTURE) ×1
SUT SILK 2-0 18XBRD TIE 12 (SUTURE) ×1 IMPLANT
SUT VIC AB 1 CTX 27 (SUTURE) ×2 IMPLANT
TOWEL GREEN STERILE (TOWEL DISPOSABLE) ×1 IMPLANT
TUBE CONNECTING 12X1/4 (SUCTIONS) ×1 IMPLANT
YANKAUER SUCT BULB TIP NO VENT (SUCTIONS) ×1 IMPLANT

## 2023-04-06 NOTE — Progress Notes (Signed)
PROGRESS NOTE    [name]  ZOX:[id number] DOB: December 26, 1960 DOA: 03/31/2023 PCP: [name], MD   Chief Complaint  Patient presents with   Wound Infection    Brief Narrative:  The patient is a 62 year old obese Caucasian male with a past medical history significant for benign to chronic systolic and diastolic CHF, diabetes mellitus type 2 which is now diet controlled and improved significantly, with history of recurrent diabetic foot wounds, essential hypertension, hyperlipidemia, anemia of chronic disease with baseline hemoglobin of 10-12 and other comorbidities who presents from the podiatry office for a left foot wound.  Over the last several weeks he is reported worsening of the wound on the dorsal surface of the left foot over the first toe and now after 7 to 10 days he had progressive erythema, swelling tenderness and warmth over the dorsal aspect of the left midfoot associated with some purulent drainage.  He denies any trauma and states that he is neuropathy and has really no feeling in his feet.     He presented to his podiatry office who is very concerned and sent the patient over to the ED for further evaluation and workup and he was admitted for his diabetic foot wound and podiatry and infectious diseases been consulted.     Given his MRI findings the infectious disease doctor recommended a TMA and patient was taken for TMA on 04/01/23 but podiatry feels that he may not heal properly and recommending probably definitive surgery with a BKA so Ortho was consulted to discuss BKA option versus repeat washout and potential closure. Orthopedic Surgery recommended transtibial amputation and patient is agreeable and plan is for surgical intervention on Wednesday, 04/06/2023.   Assessment & Plan:   Principal Problem:   Penetrating foot wound, left, initial encounter Active Problems:   Essential hypertension   Hyperlipidemia   Chronic combined systolic and diastolic heart failure  (HCC)   DM2 (diabetes mellitus, type 2) (HCC)   Hypokalemia   High anion gap metabolic acidosis   Anemia of chronic disease   Left foot infection   Severe protein-calorie malnutrition (HCC)  #1 infected left diabetic foot wound/ulcer -Patient noted ulcerative lesion associated dorsal aspect of left first toe with surrounding erythema, warmth, swelling, redness. -No drainage noted. -Plain films of the foot showed interval development of severe soft tissue swelling and absence of any subcutaneous gas. -MRI of the foot showed small first MTP joint effusion with synovitis and subchondral marrow edema concerning for septic arthritis.  Severe bone marrow edema in the fifth metatarsal head and shaft concerning for osteomyelitis.  Soft tissue edema along the medial aspect of the forefoot concerning for cellulitis.  Complex fluid collection along the medial aspect of the first metatarsal concerning for abscess.  Small abscess along the medial aspect of the first metatarsal head concerning for abscess. -Patient seen in consult by ID initially was on IV Vanco and cefepime as well as Flagyl. -Cefepime and Flagyl discontinued currently on IV Vanco. -Patient seen by podiatry in consultation patient underwent left transmetatarsal amputation with possible serial irrigation and debridement 04/01/2023. -Per podiatry due to significant amount of bone which was removed during surgery with some necrosis cellulitis to medial incision, patient may require repeat washout and potential closure however proximal amputation may need to be considered.  Podiatry recommended orthopedics input. -Patient seen in consultation by Dr. Lajoyce Corners, orthopedics who recommended a left transtibial amputation which was done today 04/06/2023. -Orthopedics recommending antibiotics for another 24 hours postop. -It is  noted that per ID once BKA is done antibiotics may be discontinued.  2.  Hypokalemia -Repleted.  3.  Hypomagnesemia -Magnesium  sulfate 4 g IV x 1. -Repeat labs in the AM.  4.  Chronic combined systolic diastolic CHF -Recent 2D echo January 2023 with EF of 45 to 50%, grade 3 diastolic dysfunction. -Currently euvolemic on examination. -Noted to takes Lasix on a as needed basis. -Metoprolol resumed.  5.  Diabetes mellitus type 2, not diet controlled -Hemoglobin A1c 5.4. -Not on oral hypoglycemics or insulin as outpatient. -CBG 131 this morning. -Change CBGs to before meals and at bedtime.  SSI.  6.  Hypertension -Norvasc, metoprolol.  7.  Hyperlipidemia -Continue statin.  8.  Constipation -Continue current bowel regimen.  9.  Anemia of chronic disease -Anemia panel with iron level of 31, TIBC 291, folate of 33.1, ferritin 138. -Status post transfusion 1 unit PRBCs preoperatively. -Hemoglobin currently stable at 9.2. -Follow H&H. -Transfusion threshold hemoglobin < 8.  10.  Obesity -BMI 37.11 kg/m. -Lifestyle modification -Outpatient follow-up with PCP.    DVT prophylaxis: SCDs Code Status: Full Family Communication: Updated patient and girlfriend at bedside. Disposition: TBD  Status is: Inpatient Remains inpatient appropriate because: Severity of illness   Consultants:  Podiatry: Dr. Ralene Cork 03/31/2023 Infectious disease: Dr. Drue Second 04/01/2023 Orthopedics: Dr. Lajoyce Corners 04/03/2023  Procedures:  Plan films of the left foot 03/31/2023, 04/01/2023 MRI left foot 03/31/2023 Left transmetatarsal amputation possible serial irrigation and debridement: Per Dr. Ralene Cork podiatry 04/01/2023 Left BKA application of Kerecis micro graft and Kerecis sheet/application of Prevena customizable and Prevena after a formal wound VAC dressings/application of [place] wear stump shrinker and Hanger limb protector: Per orthopedics Dr. Lajoyce Corners 04/06/2023 Transfusion 1 unit PRBCs 04/03/2023  Antimicrobials:  Anti-infectives (From admission, onward)    Start     Dose/Rate Route Frequency Ordered Stop   04/06/23 0835  ceFAZolin  (ANCEF) IVPB 3g/100 mL premix        3 g 200 mL/hr over 30 Minutes Intravenous On call to O.R. 04/05/23 1915 04/06/23 0950   04/06/23 0735  vancomycin (VANCOREADY) IVPB 1500 mg/300 mL  Status:  Discontinued        1,500 mg 150 mL/hr over 120 Minutes Intravenous On call to O.R. 04/05/23 1933 04/06/23 0128   04/06/23 0600  vancomycin (VANCOREADY) IVPB 1500 mg/300 mL  Status:  Discontinued        1,500 mg 150 mL/hr over 120 Minutes Intravenous On call to O.R. 04/05/23 1915 04/05/23 1930   04/06/23 0600  vancomycin (VANCOREADY) IVPB 1500 mg/300 mL        1,500 mg 150 mL/hr over 120 Minutes Intravenous To Short Stay 04/06/23 0129 04/06/23 1049   04/03/23 2200  vancomycin (VANCOREADY) IVPB 1500 mg/300 mL        1,500 mg 150 mL/hr over 120 Minutes Intravenous Every 12 hours 04/03/23 1240 04/07/23 2200   04/01/23 1000  vancomycin (VANCOCIN) IVPB 1000 mg/200 mL premix  Status:  Discontinued        1,000 mg 200 mL/hr over 60 Minutes Intravenous Every 12 hours 03/31/23 2029 04/03/23 1240   03/31/23 2015  vancomycin (VANCOREADY) IVPB 2000 mg/400 mL        2,000 mg 200 mL/hr over 120 Minutes Intravenous  Once 03/31/23 2008 04/01/23 0412   03/31/23 2015  ceFEPIme (MAXIPIME) 2 g in sodium chloride 0.9 % 100 mL IVPB  Status:  Discontinued        2 g 200 mL/hr over 30 Minutes Intravenous Every 8  hours 03/31/23 2008 04/04/23 1146   03/31/23 2015  metroNIDAZOLE (FLAGYL) IVPB 500 mg  Status:  Discontinued        500 mg 100 mL/hr over 60 Minutes Intravenous Every 12 hours 03/31/23 2008 04/04/23 1146   03/31/23 1945  vancomycin (VANCOCIN) IVPB 1000 mg/200 mL premix  Status:  Discontinued        1,000 mg 200 mL/hr over 60 Minutes Intravenous  Once 03/31/23 1933 03/31/23 2008   03/31/23 1945  ceFEPIme (MAXIPIME) 2 g in sodium chloride 0.9 % 100 mL IVPB  Status:  Discontinued        2 g 200 mL/hr over 30 Minutes Intravenous  Once 03/31/23 1933 03/31/23 2008         Subjective: Patient just returned  from a couple hours ago.  Denies any chest pain or shortness of breath.  No abdominal pain.  GirlFriend/significant other at bedside.  Objective: Vitals:   04/06/23 1045 04/06/23 1100 04/06/23 1115 04/06/23 1640  BP: 129/80 126/75 (!) 140/81 (!) 152/96  Pulse: 69 70 68 90  Resp: 20 (!) 21 18 16   Temp:  97.8 F (36.6 C) 97.9 F (36.6 C) 97.7 F (36.5 C)  TempSrc:   Axillary Axillary  SpO2: 95% 95% 100% 96%  Weight:      Height:        Intake/Output Summary (Last 24 hours) at 04/06/2023 1935 Last data filed at 04/06/2023 1300 Gross per 24 hour  Intake 300 ml  Output 1810 ml  Net -1510 ml   Filed Weights   03/31/23 1603 04/05/23 0545  Weight: 126.3 kg 124.1 kg    Examination:  General exam: Appears calm and comfortable  Respiratory system: Clear to auscultation anterior lung fields.Marland Kitchen Respiratory effort normal. Cardiovascular system: S1 & S2 heard, RRR. No JVD, murmurs, rubs, gallops or clicks. No pedal edema. Gastrointestinal system: Abdomen is nondistended, soft and nontender. No organomegaly or masses felt. Normal bowel sounds heard. Central nervous system: Alert and oriented. No focal neurological deficits. Extremities: Status post left BKA in stump shrinker and hanger limb protector.  Symmetric 5 x 5 power. Skin: No rashes, lesions or ulcers Psychiatry: Judgement and insight appear normal. Mood & affect appropriate.     Data Reviewed: I have personally reviewed following labs and imaging studies  CBC: Recent Labs  Lab 04/02/23 0214 04/03/23 0044 04/03/23 1946 04/04/23 1006 04/05/23 0148 04/06/23 1214  WBC 4.4 4.4  --  5.7 6.3 5.6  NEUTROABS 2.7 2.6  --  3.7 4.1 3.4  HGB 9.4* 8.2* 9.5* 10.4* 9.3* 9.2*  HCT 29.1* 25.1* 28.8* 30.5* 28.4* 28.4*  MCV 91.8 90.6  --  90.0 91.0 91.3  PLT 188 163  --  180 207 220    Basic Metabolic Panel: Recent Labs  Lab 04/02/23 0214 04/03/23 0044 04/04/23 1006 04/05/23 0148 04/06/23 1214  NA 135 130* 137 135 136  K 3.7  3.4* 4.3 4.2 3.8  CL 102 102 104 102 107  CO2 22 20* 18* 22 20*  GLUCOSE 139* 162* 121* 127* 119*  BUN 20 18 12 13 11   CREATININE 0.82 0.74 0.79 0.87 0.65  CALCIUM 8.8* 8.3* 8.7* 8.4* 8.1*  MG 1.8 1.6* 1.7 1.9 1.6*  PHOS 4.1 4.2 2.7 2.9  --     GFR: Estimated Creatinine Clearance: 131.9 mL/min (by C-G formula based on SCr of 0.65 mg/dL).  Liver Function Tests: Recent Labs  Lab 04/01/23 0200 04/02/23 0214 04/03/23 0044 04/04/23 1006 04/05/23 0148  AST 26  29 24 30 21   ALT 34 34 30 27 26   ALKPHOS 153* 152* 143* 130* 119  BILITOT 0.9 0.7 0.6 0.9 0.9  PROT 6.7 7.0 6.1* 7.2 6.9  ALBUMIN 2.3* 2.4* 2.0* 2.4* 2.3*    CBG: Recent Labs  Lab 04/06/23 0700 04/06/23 0847 04/06/23 1030 04/06/23 1117 04/06/23 1648  GLUCAP 122* 131* 130* 118* 117*     Recent Results (from the past 240 hour(s))  Blood culture (routine x 2)     Status: None   Collection Time: 03/31/23  8:48 PM   Specimen: BLOOD  Result Value Ref Range Status   Specimen Description BLOOD BLOOD LEFT FOREARM  Final   Special Requests   Final    BOTTLES DRAWN AEROBIC AND ANAEROBIC Blood Culture adequate volume   Culture   Final    NO GROWTH 5 DAYS Performed at [facility], [address] [place], [place] [address]    Report Status 04/05/2023 FINAL  Final  Blood culture (routine x 2)     Status: None   Collection Time: 03/31/23 10:58 PM   Specimen: BLOOD RIGHT ARM  Result Value Ref Range Status   Specimen Description BLOOD RIGHT ARM  Final   Special Requests   Final    BOTTLES DRAWN AEROBIC AND ANAEROBIC Blood Culture adequate volume   Culture   Final    NO GROWTH 5 DAYS Performed at [facility], [address] [place], [place] [address]    Report Status 04/05/2023 FINAL  Final  Surgical pcr screen     Status: Abnormal   Collection Time: 04/01/23 12:40 AM   Specimen: Nasal Mucosa; Nasal Swab  Result Value Ref Range Status   MRSA, PCR POSITIVE (A) NEGATIVE Final    Comment: RESULT CALLED  TO, READ BACK BY AND VERIFIED WITH: A. PETTIFORD RN 04/01/23 @ 0223 BY AB    Staphylococcus aureus POSITIVE (A) NEGATIVE Final    Comment: (NOTE) The Xpert SA Assay (FDA approved for NASAL specimens in patients 64 years of age and older), is one component of a comprehensive surveillance program. It is not intended to diagnose infection nor to guide or monitor treatment. Performed at [facility], [address] [place], [place] [address]   Aerobic/Anaerobic Culture w Gram Stain (surgical/deep wound)     Status: None   Collection Time: 04/01/23  1:37 PM   Specimen: Path Tissue  Result Value Ref Range Status   Specimen Description WOUND  Final   Special Requests RESIDUAL FIRST METATARSEL  Final   Gram Stain NO WBC SEEN NO ORGANISMS SEEN   Final   Culture   Final    RARE METHICILLIN RESISTANT STAPHYLOCOCCUS AUREUS RARE CORYNEBACTERIUM STRIATUM Standardized susceptibility testing for this organism is not available. NO ANAEROBES ISOLATED Performed at [facility], [address], [place], [place] [address]    Report Status 04/06/2023 FINAL  Final   Organism ID, Bacteria METHICILLIN RESISTANT STAPHYLOCOCCUS AUREUS  Final      Susceptibility   Methicillin resistant staphylococcus aureus - MIC*    CIPROFLOXACIN 4 RESISTANT Resistant     ERYTHROMYCIN >=8 RESISTANT Resistant     GENTAMICIN <=0.5 SENSITIVE Sensitive     OXACILLIN >=4 RESISTANT Resistant     TETRACYCLINE <=1 SENSITIVE Sensitive     VANCOMYCIN 1 SENSITIVE Sensitive     TRIMETH/SULFA >=320 RESISTANT Resistant     CLINDAMYCIN <=0.25 SENSITIVE Sensitive     RIFAMPIN <=0.5 SENSITIVE Sensitive  Inducible Clindamycin NEGATIVE Sensitive     LINEZOLID 2 SENSITIVE Sensitive     * RARE METHICILLIN RESISTANT STAPHYLOCOCCUS AUREUS         Radiology Studies: No results found.      Scheduled Meds:  amLODipine  5 mg Oral Daily   vitamin C  1,000 mg Oral Daily   atorvastatin  80 mg Oral Daily   [START  ON 04/07/2023] docusate sodium  100 mg Oral Daily   fluticasone  1 spray Each Nare Daily   folic acid  1 mg Oral Daily   [START ON 04/07/2023] insulin aspart  0-9 Units Subcutaneous TID WC   metoprolol succinate  25 mg Oral Daily   nutrition supplement (JUVEN)  1 packet Oral BID BM   pantoprazole  40 mg Oral Daily   thiamine  100 mg Oral Daily   zinc sulfate  220 mg Oral Daily   Continuous Infusions:  sodium chloride 75 mL/hr at 04/06/23 1138   magnesium sulfate bolus IVPB     vancomycin 1,500 mg (04/05/23 2112)     LOS: 6 days    Time spent: 35 minutes    Ramiro Harvest, MD [facility]   To contact the attending provider between 7A-7P or the covering provider during after hours 7P-7A, please log into the web site [email or web address] and access using universal [place] password for that web site. If you do not have the password, please call the hospital operator.  04/06/2023, 7:35 PM

## 2023-04-06 NOTE — Transfer of Care (Signed)
Immediate Anesthesia Transfer of Care Note  Patient: Cinsere Damboise  Procedure(s) Performed: LEFT BELOW KNEE AMPUTATION (Left: Knee)  Patient Location: PACU  Anesthesia Type:General  Level of Consciousness: drowsy and patient cooperative  Airway & Oxygen Therapy: Patient Spontanous Breathing and Patient connected to nasal cannula oxygen  Post-op Assessment: Report given to RN and Post -op Vital signs reviewed and stable  Post vital signs: Reviewed and stable  Last Vitals:  Vitals Value Taken Time  BP 121/85 04/06/23 1030  Temp 36.4 C 04/06/23 1030  Pulse 71 04/06/23 1038  Resp 20 04/06/23 1038  SpO2 91 % 04/06/23 1038  Vitals shown include unvalidated device data.  Last Pain:  Vitals:   04/06/23 1030  TempSrc:   PainSc: Asleep      Patients Stated Pain Goal: 2 (04/01/23 1207)  Complications: No notable events documented.

## 2023-04-06 NOTE — Anesthesia Procedure Notes (Signed)
Procedure Name: LMA Insertion Date/Time: 04/06/2023 9:49 AM  Performed by: Gus Puma, CRNAPre-anesthesia Checklist: Patient identified, Emergency Drugs available, Suction available and Patient being monitored Patient Re-evaluated:Patient Re-evaluated prior to induction Oxygen Delivery Method: Circle System Utilized Preoxygenation: Pre-oxygenation with 100% oxygen Induction Type: IV induction Ventilation: Mask ventilation without difficulty LMA: LMA inserted LMA Size: 4.0 Number of attempts: 1 Airway Equipment and Method: Bite block Placement Confirmation: positive ETCO2 Tube secured with: Tape Dental Injury: Teeth and Oropharynx as per pre-operative assessment

## 2023-04-06 NOTE — Anesthesia Preprocedure Evaluation (Signed)
Anesthesia Evaluation  Patient identified by MRN, date of birth, ID band Patient awake    Reviewed: Allergy & Precautions, H&P , NPO status , Patient's Chart, lab work & pertinent test results  History of Anesthesia Complications (+) PONV and history of anesthetic complications  Airway Mallampati: II   Neck ROM: full    Dental   Pulmonary sleep apnea , former smoker   breath sounds clear to auscultation       Cardiovascular hypertension, +CHF and + DOE   Rhythm:regular Rate:Normal     Neuro/Psych  PSYCHIATRIC DISORDERS Anxiety Depression       GI/Hepatic ,GERD  ,,(+)     substance abuse  alcohol use  Endo/Other  diabetes, Type 2Hypothyroidism    Renal/GU      Musculoskeletal  (+) Arthritis ,    Abdominal   Peds  Hematology   Anesthesia Other Findings   Reproductive/Obstetrics                             Anesthesia Physical Anesthesia Plan  ASA: 3  Anesthesia Plan: General   Post-op Pain Management: Regional block*   Induction: Intravenous  PONV Risk Score and Plan: 3 and Ondansetron, Dexamethasone, Midazolam and Treatment may vary due to age or medical condition  Airway Management Planned: LMA  Additional Equipment:   Intra-op Plan:   Post-operative Plan: Extubation in OR  Informed Consent: I have reviewed the patients History and Physical, chart, labs and discussed the procedure including the risks, benefits and alternatives for the proposed anesthesia with the patient or authorized representative who has indicated his/her understanding and acceptance.     Dental advisory given  Plan Discussed with: Anesthesiologist, CRNA and Surgeon  Anesthesia Plan Comments:        Anesthesia Quick Evaluation

## 2023-04-06 NOTE — Plan of Care (Signed)
  Problem: Education: Goal: Ability to describe self-care measures that may prevent or decrease complications (Diabetes Survival Skills Education) will improve Outcome: Progressing Goal: Individualized Educational Video(s) Outcome: Progressing   Problem: Coping: Goal: Ability to adjust to condition or change in health will improve Outcome: Progressing   Problem: Fluid Volume: Goal: Ability to maintain a balanced intake and output will improve Outcome: Progressing   Problem: Health Behavior/Discharge Planning: Goal: Ability to identify and utilize available resources and services will improve Outcome: Progressing Goal: Ability to manage health-related needs will improve Outcome: Progressing   Problem: Metabolic: Goal: Ability to maintain appropriate glucose levels will improve Outcome: Progressing   Problem: Nutritional: Goal: Maintenance of adequate nutrition will improve Outcome: Progressing Goal: Progress toward achieving an optimal weight will improve Outcome: Progressing   Problem: Skin Integrity: Goal: Risk for impaired skin integrity will decrease Outcome: Progressing   Problem: Tissue Perfusion: Goal: Adequacy of tissue perfusion will improve Outcome: Progressing   Problem: Education: Goal: Knowledge of General Education information will improve Description: Including pain rating scale, medication(s)/side effects and non-pharmacologic comfort measures Outcome: Progressing   Problem: Health Behavior/Discharge Planning: Goal: Ability to manage health-related needs will improve Outcome: Progressing   Problem: Clinical Measurements: Goal: Ability to maintain clinical measurements within normal limits will improve Outcome: Progressing Goal: Will remain free from infection Outcome: Progressing Goal: Diagnostic test results will improve Outcome: Progressing Goal: Respiratory complications will improve Outcome: Progressing Goal: Cardiovascular complication will  be avoided Outcome: Progressing   Problem: Activity: Goal: Risk for activity intolerance will decrease Outcome: Progressing   Problem: Nutrition: Goal: Adequate nutrition will be maintained Outcome: Progressing   Problem: Coping: Goal: Level of anxiety will decrease Outcome: Progressing   Problem: Elimination: Goal: Will not experience complications related to bowel motility Outcome: Progressing Goal: Will not experience complications related to urinary retention Outcome: Progressing   Problem: Pain Managment: Goal: General experience of comfort will improve Outcome: Progressing   Problem: Safety: Goal: Ability to remain free from injury will improve Outcome: Progressing   Problem: Skin Integrity: Goal: Risk for impaired skin integrity will decrease Outcome: Progressing   Problem: Education: Goal: Knowledge of the prescribed therapeutic regimen will improve Outcome: Progressing Goal: Ability to verbalize activity precautions or restrictions will improve Outcome: Progressing Goal: Understanding of discharge needs will improve Outcome: Progressing   Problem: Activity: Goal: Ability to perform//tolerate increased activity and mobilize with assistive devices will improve Outcome: Progressing   Problem: Clinical Measurements: Goal: Postoperative complications will be avoided or minimized Outcome: Progressing   Problem: Self-Care: Goal: Ability to meet self-care needs will improve Outcome: Progressing   Problem: Self-Concept: Goal: Ability to maintain and perform role responsibilities to the fullest extent possible will improve Outcome: Progressing   Problem: Pain Management: Goal: Pain level will decrease with appropriate interventions Outcome: Progressing   Problem: Skin Integrity: Goal: Demonstration of wound healing without infection will improve Outcome: Progressing   

## 2023-04-06 NOTE — Progress Notes (Signed)
Orthopedic Tech Progress Note Patient Details:  [name] 13-Aug-1961 [id number]  Patient has AMPUSHIELD BK with SHRINKER   Patient ID: [name], male   DOB: 21-Jun-1961, 62 y.o.   MRN: [id number]  Donald Pore 04/06/2023, 10:29 AM

## 2023-04-06 NOTE — Interval H&P Note (Signed)
History and Physical Interval Note:  04/06/2023 6:40 AM  [name]  has presented today for surgery, with the diagnosis of Osteomyelitis Left Foot.  The various methods of treatment have been discussed with the patient and family. After consideration of risks, benefits and other options for treatment, the patient has consented to  Procedure(s): LEFT BELOW KNEE AMPUTATION (Left) as a surgical intervention.  The patient's history has been reviewed, patient examined, no change in status, stable for surgery.  I have reviewed the patient's chart and labs.  Questions were answered to the patient's satisfaction.     [name]

## 2023-04-06 NOTE — Anesthesia Procedure Notes (Signed)
Anesthesia Regional Block: Popliteal block   Pre-Anesthetic Checklist: , timeout performed,  Correct Patient, Correct Site, Correct Laterality,  Correct Procedure, Correct Position, site marked,  Risks and benefits discussed,  Surgical consent,  Pre-op evaluation,  At surgeon's request and post-op pain management  Laterality: Left  Prep: chloraprep       Needles:  Injection technique: Single-shot  Needle Type: Echogenic Stimulator Needle          Additional Needles:   Procedures:, nerve stimulator,,,,,     Nerve Stimulator or Paresthesia:  Response: plantar flexion of foot, 0.45 mA  Additional Responses:   Narrative:  Start time: 04/06/2023 9:20 AM End time: 04/06/2023 9:28 AM Injection made incrementally with aspirations every 5 mL.  Performed by: Personally  Anesthesiologist: Achille Rich, MD  Additional Notes: Functioning IV was confirmed and monitors were applied.  A 90mm 21ga Arrow echogenic stimulator needle was used. Sterile prep and drape,hand hygiene and sterile gloves were used.  Negative aspiration and negative test dose prior to incremental administration of local anesthetic. The patient tolerated the procedure well.  Ultrasound guidance: relevent anatomy identified, needle position confirmed, local anesthetic spread visualized around nerve(s), vascular puncture avoided.  Image printed for medical record.

## 2023-04-06 NOTE — Op Note (Signed)
04/06/2023  10:27 AM  PATIENT:  [name]    PRE-OPERATIVE DIAGNOSIS:  Osteomylitis of Left Foot  POST-OPERATIVE DIAGNOSIS:  Same  PROCEDURE:  LEFT BELOW KNEE AMPUTATION Application of Kerecis micro graft 38 cm and Kerecis sheet 7 x 3 cm. Application of Prevena customizable and Prevena arthroform wound VAC dressings Application of Vive Wear stump shrinker and the Hanger limb protector  SURGEON:  [name], MD  ANESTHESIA:   General  PREOPERATIVE INDICATIONS:  [name] is a  62 y.o. male with a diagnosis of Osteomylitis of Left Foot who failed conservative measures and elected for surgical management.    The risks benefits and alternatives were discussed with the patient preoperatively including but not limited to the risks of infection, bleeding, nerve injury, cardiopulmonary complications, the need for revision surgery, among others, and the patient was willing to proceed.  OPERATIVE IMPLANTS:   Implant Name Type Inv. Item Serial No. Manufacturer Lot No. LRB No. Used Action  GRAFT SKIN WND MICRO 38 - [id number] Tissue GRAFT SKIN WND MICRO 38  [employer] [id number] Left 1 Implanted  GRAFT SKIN WND SURGIBIND 3X7 - [id number] Tissue GRAFT SKIN WND SURGIBIND 3X7  [employer] [id number] Left 1 Implanted     OPERATIVE FINDINGS: tissue margins with muscle with poor consistency  OPERATIVE PROCEDURE: Patient was brought to the operating room after undergoing a regional anesthetic.  After adequate levels anesthesia were obtained a thigh tourniquet was placed and the lower extremity was prepped using DuraPrep draped into a sterile field. The foot was draped out of the sterile field with impervious stockinette.  A timeout was called and the tourniquet inflated.  A transverse skin incision was made 12 cm distal to the tibial tubercle, the incision curved proximally, and a large posterior flap was created.  The tibia was transected just proximal to the skin incision and beveled  anteriorly.  The fibula was transected just proximal to the tibial incision.  The sciatic nerve was pulled cut and allowed to retract.  The vascular bundles were suture ligated with 2-0 silk.  The tourniquet was deflated and hemostasis obtained.        The Kerecis micro powder 38 cm was applied to the open wound that has a 200 cm surface area.  The 7 x 3 cm Kerecis sheet was then filled with bone graft secured to the distal tibia and fibula with #1 Vicryl.   The deep and superficial fascial layers were closed using #1 Vicryl.  The skin was closed using staples.    The Prevena customizable dressing was applied this was overwrapped with the arthroform sponge.  Collier Flowers was used to secure the sponges and the circumferential compression was secured to the skin with Dermatac.  This was connected to the wound VAC pump and had a good suction fit this was covered with a stump shrinker and a limb protector.  Patient was taken to the PACU in stable condition.   DISCHARGE PLANNING:  Antibiotic duration: 24-hour antibiotics  Weightbearing: Nonweightbearing on the operative extremity  Pain medication: Opioid pathway  Dressing care/ Wound VAC: Continue wound VAC with the Prevena plus pump at discharge for 1 week  Ambulatory devices: Walker or kneeling scooter  Discharge to: Discharge planning based on recommendations per physical therapy  Follow-up: In the office 1 week after discharge.

## 2023-04-07 ENCOUNTER — Encounter (HOSPITAL_COMMUNITY): Payer: Self-pay | Admitting: Orthopedic Surgery

## 2023-04-07 DIAGNOSIS — I5042 Chronic combined systolic (congestive) and diastolic (congestive) heart failure: Secondary | ICD-10-CM | POA: Diagnosis not present

## 2023-04-07 DIAGNOSIS — E782 Mixed hyperlipidemia: Secondary | ICD-10-CM | POA: Diagnosis not present

## 2023-04-07 DIAGNOSIS — S91332A Puncture wound without foreign body, left foot, initial encounter: Secondary | ICD-10-CM | POA: Diagnosis not present

## 2023-04-07 DIAGNOSIS — L089 Local infection of the skin and subcutaneous tissue, unspecified: Secondary | ICD-10-CM | POA: Diagnosis not present

## 2023-04-07 LAB — CBC WITH DIFFERENTIAL/PLATELET
Abs Immature Granulocytes: 0.15 10*3/uL — ABNORMAL HIGH (ref 0.00–0.07)
Basophils Absolute: 0 10*3/uL (ref 0.0–0.1)
Basophils Relative: 0 %
Eosinophils Absolute: 0.1 10*3/uL (ref 0.0–0.5)
Eosinophils Relative: 2 %
HCT: 27.6 % — ABNORMAL LOW (ref 39.0–52.0)
Hemoglobin: 9.1 g/dL — ABNORMAL LOW (ref 13.0–17.0)
Immature Granulocytes: 2 %
Lymphocytes Relative: 20 %
Lymphs Abs: 1.5 10*3/uL (ref 0.7–4.0)
MCH: 30 pg (ref 26.0–34.0)
MCHC: 33 g/dL (ref 30.0–36.0)
MCV: 91.1 fL (ref 80.0–100.0)
Monocytes Absolute: 0.4 10*3/uL (ref 0.1–1.0)
Monocytes Relative: 6 %
Neutro Abs: 5.3 10*3/uL (ref 1.7–7.7)
Neutrophils Relative %: 70 %
Platelets: 249 10*3/uL (ref 150–400)
RBC: 3.03 MIL/uL — ABNORMAL LOW (ref 4.22–5.81)
RDW: 14.6 % (ref 11.5–15.5)
WBC: 7.5 10*3/uL (ref 4.0–10.5)
nRBC: 0 % (ref 0.0–0.2)

## 2023-04-07 LAB — BASIC METABOLIC PANEL
Anion gap: 7 (ref 5–15)
BUN: 14 mg/dL (ref 8–23)
CO2: 20 mmol/L — ABNORMAL LOW (ref 22–32)
Calcium: 8.3 mg/dL — ABNORMAL LOW (ref 8.9–10.3)
Chloride: 107 mmol/L (ref 98–111)
Creatinine, Ser: 0.76 mg/dL (ref 0.61–1.24)
GFR, Estimated: >60 mL/min (ref >60)
Glucose, Bld: 128 mg/dL — ABNORMAL HIGH (ref 70–99)
Potassium: 3.5 mmol/L (ref 3.5–5.1)
Sodium: 134 mmol/L — ABNORMAL LOW (ref 135–145)

## 2023-04-07 LAB — MAGNESIUM: Magnesium: 1.6 mg/dL — ABNORMAL LOW (ref 1.7–2.4)

## 2023-04-07 LAB — GLUCOSE, CAPILLARY
Glucose-Capillary: 131 mg/dL — ABNORMAL HIGH (ref 70–99)
Glucose-Capillary: 136 mg/dL — ABNORMAL HIGH (ref 70–99)
Glucose-Capillary: 144 mg/dL — ABNORMAL HIGH (ref 70–99)
Glucose-Capillary: 131 mg/dL — ABNORMAL HIGH (ref 70–99)

## 2023-04-07 LAB — SURGICAL PATHOLOGY

## 2023-04-07 MED ORDER — ENOXAPARIN SODIUM 40 MG/0.4ML IJ SOSY
40.0000 mg | PREFILLED_SYRINGE | INTRAMUSCULAR | Status: DC
  Administered 2023-04-07: 40 mg via SUBCUTANEOUS
  Filled 2023-04-07: qty 0.4

## 2023-04-07 MED ORDER — MAGNESIUM SULFATE 4 GM/100ML IV SOLN
4.0000 g | Freq: Once | INTRAVENOUS | Status: AC
  Administered 2023-04-07: 4 g via INTRAVENOUS
  Filled 2023-04-07: qty 100

## 2023-04-07 NOTE — Progress Notes (Signed)
PROGRESS NOTE    [name]  ZOX:[id number] DOB: July 29, 1961 DOA: 03/31/2023 PCP: [name], MD   Chief Complaint  Patient presents with   Wound Infection    Brief Narrative:  The patient is a 62 year old obese Caucasian male with a past medical history significant for benign to chronic systolic and diastolic CHF, diabetes mellitus type 2 which is now diet controlled and improved significantly, with history of recurrent diabetic foot wounds, essential hypertension, hyperlipidemia, anemia of chronic disease with baseline hemoglobin of 10-12 and other comorbidities who presents from the podiatry office for a left foot wound.  Over the last several weeks he is reported worsening of the wound on the dorsal surface of the left foot over the first toe and now after 7 to 10 days he had progressive erythema, swelling tenderness and warmth over the dorsal aspect of the left midfoot associated with some purulent drainage.  He denies any trauma and states that he is neuropathy and has really no feeling in his feet.     He presented to his podiatry office who is very concerned and sent the patient over to the ED for further evaluation and workup and he was admitted for his diabetic foot wound and podiatry and infectious diseases been consulted.     Given his MRI findings the infectious disease doctor recommended a TMA and patient was taken for TMA on 04/01/23 but podiatry feels that he may not heal properly and recommending probably definitive surgery with a BKA so Ortho was consulted to discuss BKA option versus repeat washout and potential closure. Orthopedic Surgery recommended transtibial amputation and patient is agreeable and plan is for surgical intervention on Wednesday, 04/06/2023.   Assessment & Plan:   Principal Problem:   Penetrating foot wound, left, initial encounter Active Problems:   Essential hypertension   Hyperlipidemia   Chronic combined systolic and diastolic heart failure  (HCC)   DM2 (diabetes mellitus, type 2) (HCC)   Hypokalemia   High anion gap metabolic acidosis   Anemia of chronic disease   Left foot infection   Severe protein-calorie malnutrition (HCC)  #1 infected left diabetic foot wound/ulcer -Patient noted ulcerative lesion associated dorsal aspect of left first toe with surrounding erythema, warmth, swelling, redness. -No drainage noted. -Plain films of the foot showed interval development of severe soft tissue swelling and absence of any subcutaneous gas. -MRI of the foot showed small first MTP joint effusion with synovitis and subchondral marrow edema concerning for septic arthritis.  Severe bone marrow edema in the fifth metatarsal head and shaft concerning for osteomyelitis.  Soft tissue edema along the medial aspect of the forefoot concerning for cellulitis.  Complex fluid collection along the medial aspect of the first metatarsal concerning for abscess.  Small abscess along the medial aspect of the first metatarsal head concerning for abscess. -Patient seen in consult by ID initially was on IV Vanco and cefepime as well as Flagyl. -Cefepime and Flagyl discontinued currently on IV vancomycin and to complete course of antibiotic treatment through today.. -Patient seen by podiatry in consultation patient underwent left transmetatarsal amputation with possible serial irrigation and debridement 04/01/2023. -Per podiatry due to significant amount of bone which was removed during surgery with some necrosis cellulitis to medial incision, patient may require repeat washout and potential closure however proximal amputation may need to be considered.  Podiatry recommended orthopedics input. -Patient seen in consultation by Dr. Lajoyce Corners, orthopedics who recommended a left transtibial amputation which was done, 04/06/2023. -Orthopedics recommending  antibiotics for another 24 hours postop. -It is noted that per ID once BKA is done antibiotics may be  discontinued.  2.  Hypokalemia -Repleted. -Potassium at 3.5.  3.  Hypomagnesemia -Magnesium at 1.6 this morning.   -Magnesium sulfate 4 g IV x 1.   -Repeat labs in the AM.   4.  Chronic combined systolic diastolic CHF -Recent 2D echo January 2023 with EF of 45 to 50%, grade 3 diastolic dysfunction. -Currently euvolemic on examination. -Noted to takes Lasix on a as needed basis. -Continue metoprolol.   5.  Diabetes mellitus type 2, diet controlled -Hemoglobin A1c 5.4. -Not on oral hypoglycemics or insulin as outpatient. -CBG 131 this morning. -SSI.  6.  Hypertension -Continue Norvasc, metoprolol.   7.  Hyperlipidemia -Statin.   8.  Constipation -Continue current bowel regimen.  9.  Anemia of chronic disease -Anemia panel with iron level of 31, TIBC 291, folate of 33.1, ferritin 138. -Status post transfusion 1 unit PRBCs preoperatively. -Hemoglobin currently stable at 9.1. -Follow H&H. -Transfusion threshold hemoglobin < 8.  10.  Obesity -BMI 37.11 kg/m. -Lifestyle modification -Outpatient follow-up with PCP.    DVT prophylaxis: SCDs Code Status: Full Family Communication: Updated patient and girlfriend at bedside. Disposition: TBD/??  CIR  Status is: Inpatient Remains inpatient appropriate because: Severity of illness   Consultants:  Podiatry: Dr. Ralene Cork 03/31/2023 Infectious disease: Dr. Drue Second 04/01/2023 Orthopedics: Dr. Lajoyce Corners 04/03/2023  Procedures:  Plan films of the left foot 03/31/2023, 04/01/2023 MRI left foot 03/31/2023 Left transmetatarsal amputation possible serial irrigation and debridement: Per Dr. Ralene Cork podiatry 04/01/2023 Left BKA application of Kerecis micro graft and Kerecis sheet/application of Prevena customizable and Prevena after a formal wound VAC dressings/application of [place] wear stump shrinker and Hanger limb protector: Per orthopedics Dr. Lajoyce Corners 04/06/2023 Transfusion 1 unit PRBCs 04/03/2023  Antimicrobials:  Anti-infectives (From  admission, onward)    Start     Dose/Rate Route Frequency Ordered Stop   04/06/23 0835  ceFAZolin (ANCEF) IVPB 3g/100 mL premix        3 g 200 mL/hr over 30 Minutes Intravenous On call to O.R. 04/05/23 1915 04/06/23 0950   04/06/23 0735  vancomycin (VANCOREADY) IVPB 1500 mg/300 mL  Status:  Discontinued        1,500 mg 150 mL/hr over 120 Minutes Intravenous On call to O.R. 04/05/23 1933 04/06/23 0128   04/06/23 0600  vancomycin (VANCOREADY) IVPB 1500 mg/300 mL  Status:  Discontinued        1,500 mg 150 mL/hr over 120 Minutes Intravenous On call to O.R. 04/05/23 1915 04/05/23 1930   04/06/23 0600  vancomycin (VANCOREADY) IVPB 1500 mg/300 mL        1,500 mg 150 mL/hr over 120 Minutes Intravenous To Short Stay 04/06/23 0129 04/06/23 1049   04/03/23 2200  vancomycin (VANCOREADY) IVPB 1500 mg/300 mL        1,500 mg 150 mL/hr over 120 Minutes Intravenous Every 12 hours 04/03/23 1240 04/07/23 2200   04/01/23 1000  vancomycin (VANCOCIN) IVPB 1000 mg/200 mL premix  Status:  Discontinued        1,000 mg 200 mL/hr over 60 Minutes Intravenous Every 12 hours 03/31/23 2029 04/03/23 1240   03/31/23 2015  vancomycin (VANCOREADY) IVPB 2000 mg/400 mL        2,000 mg 200 mL/hr over 120 Minutes Intravenous  Once 03/31/23 2008 04/01/23 0412   03/31/23 2015  ceFEPIme (MAXIPIME) 2 g in sodium chloride 0.9 % 100 mL IVPB  Status:  Discontinued  2 g 200 mL/hr over 30 Minutes Intravenous Every 8 hours 03/31/23 2008 04/04/23 1146   03/31/23 2015  metroNIDAZOLE (FLAGYL) IVPB 500 mg  Status:  Discontinued        500 mg 100 mL/hr over 60 Minutes Intravenous Every 12 hours 03/31/23 2008 04/04/23 1146   03/31/23 1945  vancomycin (VANCOCIN) IVPB 1000 mg/200 mL premix  Status:  Discontinued        1,000 mg 200 mL/hr over 60 Minutes Intravenous  Once 03/31/23 1933 03/31/23 2008   03/31/23 1945  ceFEPIme (MAXIPIME) 2 g in sodium chloride 0.9 % 100 mL IVPB  Status:  Discontinued        2 g 200 mL/hr over 30  Minutes Intravenous  Once 03/31/23 1933 03/31/23 2008         Subjective: Patient sitting up in chair.  No chest pain.  No shortness of breath.  No abdominal pain.  Pain in lower extremity improved.   Objective: Vitals:   04/06/23 1640 04/06/23 1951 04/07/23 0419 04/07/23 0740  BP: (!) 152/96 113/84 129/78 (!) 144/75  Pulse: 90 94 92 83  Resp: 16 18 18    Temp: 97.7 F (36.5 C) 98.1 F (36.7 C) 97.7 F (36.5 C) 97.8 F (36.6 C)  TempSrc: Axillary Oral Oral Oral  SpO2: 96% 96% 96% 95%  Weight:      Height:        Intake/Output Summary (Last 24 hours) at 04/07/2023 1257 Last data filed at 04/07/2023 0600 Gross per 24 hour  Intake --  Output 150 ml  Net -150 ml    Filed Weights   03/31/23 1603 04/05/23 0545  Weight: 126.3 kg 124.1 kg    Examination:  General exam: NAD. Respiratory system: CTAB.  No wheezes, no crackles, no rhonchi.  Fair air movement.  Speaking in full sentences.  Cardiovascular system: Regular rate and rhythm no murmurs rubs or gallops.  No JVD.  No lower extremity edema.  Gastrointestinal system: Abdomen is soft, nontender, nondistended, positive bowel sounds.  No rebound.  No guarding.  Central nervous system: Alert and oriented. No focal neurological deficits. Extremities: Status post left BKA in stump shrinker.  Symmetric 5 x 5 power. Skin: No rashes, lesions or ulcers Psychiatry: Judgement and insight appear normal. Mood & affect appropriate.     Data Reviewed: I have personally reviewed following labs and imaging studies  CBC: Recent Labs  Lab 04/03/23 0044 04/03/23 1946 04/04/23 1006 04/05/23 0148 04/06/23 1214 04/07/23 0128  WBC 4.4  --  5.7 6.3 5.6 7.5  NEUTROABS 2.6  --  3.7 4.1 3.4 5.3  HGB 8.2* 9.5* 10.4* 9.3* 9.2* 9.1*  HCT 25.1* 28.8* 30.5* 28.4* 28.4* 27.6*  MCV 90.6  --  90.0 91.0 91.3 91.1  PLT 163  --  180 207 220 249     Basic Metabolic Panel: Recent Labs  Lab 04/02/23 0214 04/03/23 0044 04/04/23 1006  04/05/23 0148 04/06/23 1214 04/07/23 0128  NA 135 130* 137 135 136 134*  K 3.7 3.4* 4.3 4.2 3.8 3.5  CL 102 102 104 102 107 107  CO2 22 20* 18* 22 20* 20*  GLUCOSE 139* 162* 121* 127* 119* 128*  BUN 20 18 12 13 11 14   CREATININE 0.82 0.74 0.79 0.87 0.65 0.76  CALCIUM 8.8* 8.3* 8.7* 8.4* 8.1* 8.3*  MG 1.8 1.6* 1.7 1.9 1.6* 1.6*  PHOS 4.1 4.2 2.7 2.9  --   --      GFR: Estimated Creatinine Clearance: 131.9  mL/min (by C-G formula based on SCr of 0.76 mg/dL).  Liver Function Tests: Recent Labs  Lab 04/01/23 0200 04/02/23 0214 04/03/23 0044 04/04/23 1006 04/05/23 0148  AST 26 29 24 30 21   ALT 34 34 30 27 26   ALKPHOS 153* 152* 143* 130* 119  BILITOT 0.9 0.7 0.6 0.9 0.9  PROT 6.7 7.0 6.1* 7.2 6.9  ALBUMIN 2.3* 2.4* 2.0* 2.4* 2.3*     CBG: Recent Labs  Lab 04/06/23 1117 04/06/23 1648 04/06/23 2140 04/07/23 0741 04/07/23 1130  GLUCAP 118* 117* 129* 131* 136*      Recent Results (from the past 240 hour(s))  Blood culture (routine x 2)     Status: None   Collection Time: 03/31/23  8:48 PM   Specimen: BLOOD  Result Value Ref Range Status   Specimen Description BLOOD BLOOD LEFT FOREARM  Final   Special Requests   Final    BOTTLES DRAWN AEROBIC AND ANAEROBIC Blood Culture adequate volume   Culture   Final    NO GROWTH 5 DAYS Performed at [facility], [address], [place], [place] [address]    Report Status 04/05/2023 FINAL  Final  Blood culture (routine x 2)     Status: None   Collection Time: 03/31/23 10:58 PM   Specimen: BLOOD RIGHT ARM  Result Value Ref Range Status   Specimen Description BLOOD RIGHT ARM  Final   Special Requests   Final    BOTTLES DRAWN AEROBIC AND ANAEROBIC Blood Culture adequate volume   Culture   Final    NO GROWTH 5 DAYS Performed at [facility], [address], [place], [place] [address]    Report Status 04/05/2023 FINAL  Final  Surgical pcr screen     Status: Abnormal   Collection Time: 04/01/23 12:40 AM    Specimen: Nasal Mucosa; Nasal Swab  Result Value Ref Range Status   MRSA, PCR POSITIVE (A) NEGATIVE Final    Comment: RESULT CALLED TO, READ BACK BY AND VERIFIED WITH: A. PETTIFORD RN 04/01/23 @ 0223 BY AB    Staphylococcus aureus POSITIVE (A) NEGATIVE Final    Comment: (NOTE) The Xpert SA Assay (FDA approved for NASAL specimens in patients 10 years of age and older), is one component of a comprehensive surveillance program. It is not intended to diagnose infection nor to guide or monitor treatment. Performed at [facility], [address], [place], [place] [address]   Aerobic/Anaerobic Culture w Gram Stain (surgical/deep wound)     Status: None   Collection Time: 04/01/23  1:37 PM   Specimen: Path Tissue  Result Value Ref Range Status   Specimen Description WOUND  Final   Special Requests RESIDUAL FIRST METATARSEL  Final   Gram Stain NO WBC SEEN NO ORGANISMS SEEN   Final   Culture   Final    RARE METHICILLIN RESISTANT STAPHYLOCOCCUS AUREUS RARE CORYNEBACTERIUM STRIATUM Standardized susceptibility testing for this organism is not available. NO ANAEROBES ISOLATED Performed at [facility], [address], [place], [place] [address]    Report Status 04/06/2023 FINAL  Final   Organism ID, Bacteria METHICILLIN RESISTANT STAPHYLOCOCCUS AUREUS  Final      Susceptibility   Methicillin resistant staphylococcus aureus - MIC*    CIPROFLOXACIN 4 RESISTANT Resistant     ERYTHROMYCIN >=8 RESISTANT Resistant     GENTAMICIN <=0.5 SENSITIVE Sensitive     OXACILLIN >=4 RESISTANT Resistant     TETRACYCLINE <=1 SENSITIVE Sensitive  VANCOMYCIN 1 SENSITIVE Sensitive     TRIMETH/SULFA >=320 RESISTANT Resistant     CLINDAMYCIN <=0.25 SENSITIVE Sensitive     RIFAMPIN <=0.5 SENSITIVE Sensitive     Inducible Clindamycin NEGATIVE Sensitive     LINEZOLID 2 SENSITIVE Sensitive     * RARE METHICILLIN RESISTANT STAPHYLOCOCCUS AUREUS         Radiology Studies: No results  found.      Scheduled Meds:  amLODipine  5 mg Oral Daily   vitamin C  1,000 mg Oral Daily   atorvastatin  80 mg Oral Daily   docusate sodium  100 mg Oral Daily   fluticasone  1 spray Each Nare Daily   folic acid  1 mg Oral Daily   insulin aspart  0-9 Units Subcutaneous TID WC   metoprolol succinate  25 mg Oral Daily   nutrition supplement (JUVEN)  1 packet Oral BID BM   pantoprazole  40 mg Oral Daily   thiamine  100 mg Oral Daily   zinc sulfate  220 mg Oral Daily   Continuous Infusions:  sodium chloride 75 mL/hr at 04/06/23 1138   magnesium sulfate bolus IVPB     vancomycin 1,500 mg (04/07/23 1108)     LOS: 7 days    Time spent: 35 minutes    Ramiro Harvest, MD [facility]   To contact the attending provider between 7A-7P or the covering provider during after hours 7P-7A, please log into the web site [email or web address] and access using universal [place] password for that web site. If you do not have the password, please call the hospital operator.  04/07/2023, 12:57 PM
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
## 2023-04-07 NOTE — Progress Notes (Signed)
Inpatient Rehabilitation Admissions Coordinator   Rehab consult received. I await postop therapy evals to assist with planning dispo options.  Ottie Glazier, RN, MSN Rehab Admissions Coordinator [phone] 04/07/2023 8:42 AM

## 2023-04-07 NOTE — Evaluation (Signed)
Occupational Therapy Evaluation Patient Details Name: [name] MRN: [id number] DOB: 01-18-1961 Today's Date: 04/07/2023   History of Present Illness 62 y.o. male admitted 6/13 with Infected Left Diabetic Foot Wound/Ulcer. S/p left transmetatarsal amputation 6/14. S/p Left BKA 04/06/23. PMHx:  Sepsis  L diabetic foot infection/ulcer, possible early osteomyelitis, septic joint arthritis with infected tenosynovitis, HTN, obesity, HLD, prediabetes, alcoholic cirrhosis, alcohol abuse.   Clinical Impression   Pt is now s/p L BKA and is presenting with impaired balance. Pt needing Mod A for functional STS and fatigued after multiple attempts at standing, he reports it was not as challenging earlier. Educated pt on the continue AROM of LLE joints (see below) to maintain joint integrity and strength as pt is hopeful for prosthesis in the future. Pt with good understanding of ampushield use and pressure relieve strategies for compression devices. Pt would benefit from continued acute skilled OT services to address above deficits and help progress balance for functional ADLs. Patient has the potential to reach Mod I and demos the ability to tolerate 3 hours of therapy. Pt would benefit from an intensive rehab program to help maximize functional independence.      Recommendations for follow up therapy are one component of a multi-disciplinary discharge planning process, led by the attending physician.  Recommendations may be updated based on patient status, additional functional criteria and insurance authorization.   Assistance Recommended at Discharge Intermittent Supervision/Assistance  Patient can return home with the following A little help with bathing/dressing/bathroom;Assistance with cooking/housework;A lot of help with walking and/or transfers;Help with stairs or ramp for entrance    Functional Status Assessment  Patient has had a recent decline in their functional status and demonstrates the  ability to make significant improvements in function in a reasonable and predictable amount of time.  Equipment Recommendations   (Defer to next level of care)    Recommendations for Other Services       Precautions / Restrictions Precautions Precautions: Fall;Knee Precaution Comments: Reviewed BKA precautions Required Braces or Orthoses: Other Brace Other Brace: Ampu-shield Restrictions Weight Bearing Restrictions: Yes LLE Weight Bearing: Non weight bearing      Mobility Bed Mobility Overal bed mobility: Needs Assistance Bed Mobility: Supine to Sit, Sit to Supine     Supine to sit: Supervision Sit to supine: Modified independent (Device/Increase time)        Transfers Overall transfer level: Needs assistance Equipment used: Rolling walker (2 wheels) Transfers: Sit to/from Stand Sit to Stand: From elevated surface, Mod assist           General transfer comment: Attempted STS x2 from elevated bed without OT assist but pt unable to clear bottom. Upon final STS attempted pt needing Mod A for full upright STS.      Balance Overall balance assessment: Needs assistance Sitting-balance support: Feet supported Sitting balance-Leahy Scale: Normal     Standing balance support: Bilateral upper extremity supported, During functional activity, Reliant on assistive device for balance Standing balance-Leahy Scale: Poor Standing balance comment: needs RW support                           ADL either performed or assessed with clinical judgement   ADL Overall ADL's : Needs assistance/impaired Eating/Feeding: Independent;Sitting   Grooming: Sitting;Set up;Supervision/safety   Upper Body Bathing: Independent;Sitting   Lower Body Bathing: Sitting/lateral leans;Supervison/ safety;Set up   Upper Body Dressing : Sitting;Independent   Lower Body Dressing: Sitting/lateral leans;Minimal assistance Lower Body  Dressing Details (indicate cue type and reason): Pt  verbalized step by step directions for donning ampushield Toilet Transfer: Stand-pivot;Moderate assistance   Toileting- Clothing Manipulation and Hygiene: Min guard;Sitting/lateral lean         General ADL Comments: Limited to standing at bedside today, Pt fatigued following STS attempts     Vision         Perception     Praxis      Pertinent Vitals/Pain Pain Assessment Pain Assessment: 0-10 Pain Score: 4  Pain Location: Lt residual limb Pain Descriptors / Indicators: Burning Pain Intervention(s): Monitored during session, Repositioned     Hand Dominance Right   Extremity/Trunk Assessment Upper Extremity Assessment Upper Extremity Assessment: Overall WFL for tasks assessed   Lower Extremity Assessment LLE Deficits / Details: Lt residual limb with shrinker on and ampu-shield       Communication Communication Communication: No difficulties   Cognition Arousal/Alertness: Awake/alert Behavior During Therapy: WFL for tasks assessed/performed Overall Cognitive Status: Within Functional Limits for tasks assessed                                       General Comments  Pt demonstrating good understanding of precautions and the need for visual and tactile input to LLE to reduce phantom sensation. He reports sometimes it feels like the leg is still there    Exercises General Exercises - Lower Extremity Straight Leg Raises: AROM, 10 reps, Left, Supine Hip Flexion/Marching: AROM, 10 reps, Left, Supine Other Exercises Other Exercises: LLE knee flex/ext x10 reps   Shoulder Instructions      Home Living Family/patient expects to be discharged to:: Private residence Living Arrangements: Spouse/significant other Available Help at Discharge: Family;Available 24 hours/day Type of Home: House Home Access: Stairs to enter [employer] of Steps: 3 Entrance Stairs-Rails: Right;Left;Can reach both Home Layout: Two level;Able to live on main level  with bedroom/bathroom     Bathroom Shower/Tub: Producer, television/film/video: Handicapped height Bathroom Accessibility: Yes   Home Equipment: Agricultural consultant (2 wheels);Rollator (4 wheels);Cane - single point;BSC/3in1;Shower seat - built in;Wheelchair - manual          Prior Functioning/Environment Prior Level of Function : Needs assist             Mobility Comments: Has been using cane/walker depending on status of left foot. ADLs Comments: ind bathing and dressing        OT Problem List: Decreased activity tolerance;Impaired balance (sitting and/or standing);Pain      OT Treatment/Interventions: Self-care/ADL training;Therapeutic activities;Therapeutic exercise;Patient/family education;Balance training    OT Goals(Current goals can be found in the care plan section) Acute Rehab OT Goals Patient Stated Goal: To get better OT Goal Formulation: With patient Time For Goal Achievement: 04/21/23 Potential to Achieve Goals: Good ADL Goals Additional ADL Goal #1: Pt will independently demonstrate compliance with AROM of LLE joints to maintain joint integrity.  OT Frequency: Min 3X/week    Co-evaluation              AM-PAC OT "6 Clicks" Daily Activity     Outcome Measure Help from another person eating meals?: None Help from another person taking care of personal grooming?: A Little Help from another person toileting, which includes using toliet, bedpan, or urinal?: A Lot Help from another person bathing (including washing, rinsing, drying)?: A Little Help from another person to put on and taking  off regular upper body clothing?: None Help from another person to put on and taking off regular lower body clothing?: A Little 6 Click Score: 19   End of Session Equipment Utilized During Treatment: Rolling walker (2 wheels) Nurse Communication: Mobility status  Activity Tolerance: Patient tolerated treatment well Patient left: in bed;with call bell/phone within  reach  OT Visit Diagnosis: Unsteadiness on feet (R26.81);Pain Pain - Right/Left: Left Pain - part of body: Leg                Time: 8119-1478 OT Time Calculation (min): 20 min Charges:  OT General Charges $OT Visit: 1 Visit OT Evaluation $OT Eval Moderate Complexity: 1 Mod  04/07/2023  AB, OTR/L  Acute Rehabilitation Services  Office: [phone]   Tristan Schroeder 04/07/2023, 2:59 PM

## 2023-04-07 NOTE — Progress Notes (Signed)
  Inpatient Rehabilitation Admissions Coordinator   Met with patient at bedside for rehab assessment. We discussed goals and expectations of a possible CIR admit. He prefers CIR for rehab. Family can provide expected caregiver support that is recommended. I will follow up tomorrow for clarification of medical readiness to admit in next 24 to 48 hrs. Please call me with any questions.   Ottie Glazier, RN, MSN Rehab Admissions Coordinator [phone]

## 2023-04-07 NOTE — Evaluation (Signed)
Physical Therapy Re-Evaluation Patient Details Name: [name] MRN: [id number] DOB: 01/19/1961 Today's Date: 04/07/2023  History of Present Illness  62 y.o. male admitted 6/13 with Infected Left Diabetic Foot Wound/Ulcer. S/p left transmetatarsal amputation 6/14. S/p Left BKA 04/06/23. PMHx:  Sepsis  L diabetic foot infection/ulcer, possible early osteomyelitis, septic joint arthritis with infected tenosynovitis, HTN, obesity, HLD, prediabetes, alcoholic cirrhosis, alcohol abuse.  Clinical Impression  Patient is s/p above surgery resulting in functional limitations due to the deficits listed below (see PT Problem List). Post-op, patient requiring min assist for sit to stand and step-pivot transfer to recliner. Reviewed precautions, amputee exercises, and desensitization techniques. Patient will benefit from intensive inpatient follow up therapy, >3 hours/day. Anticipate good recovery with adequate rehab. Patient is motivated to regain function and eventually receive prosthesis for BKA. Patient will benefit from acute skilled PT to increase their independence and safety with mobility to facilitate discharge.        Recommendations for follow up therapy are one component of a multi-disciplinary discharge planning process, led by the attending physician.  Recommendations may be updated based on patient status, additional functional criteria and insurance authorization.     Assistance Recommended at Discharge Intermittent Supervision/Assistance  Patient can return home with the following  A little help with walking and/or transfers;A little help with bathing/dressing/bathroom;Assistance with cooking/housework;Assist for transportation;Help with stairs or ramp for entrance    Equipment Recommendations None recommended by PT  Recommendations for Other Services  Rehab consult    Functional Status Assessment Patient has had a recent decline in their functional status and demonstrates the ability to  make significant improvements in function in a reasonable and predictable amount of time.     Precautions / Restrictions Precautions Precautions: Fall;Knee Precaution Comments: Reviewed BKA precautions Required Braces or Orthoses: Other Brace Other Brace: Ampu-shield Restrictions Weight Bearing Restrictions: Yes LLE Weight Bearing: Non weight bearing      Mobility  Bed Mobility Overal bed mobility: Needs Assistance Bed Mobility: Supine to Sit     Supine to sit: Supervision     General bed mobility comments: Supervision for safety, cues for technique, slower and guarded.    Transfers Overall transfer level: Needs assistance Equipment used: Rolling walker (2 wheels) Transfers: Sit to/from Stand, Bed to chair/wheelchair/BSC Sit to Stand: From elevated surface, Min assist   Step pivot transfers: Min assist       General transfer comment: Min assist for boost and balance to rise from elevated bed surface, cues for technique and hand placement. Min assist for walker control and balance with step pivot towards right side to chair. Reduced foot clearance. Cues for technique. Reduced control with descent into chair.    Ambulation/Gait               General Gait Details: Deferred due to dizziness and weakness this morning.  Stairs            Wheelchair Mobility    Modified Rankin (Stroke Patients Only)       Balance Overall balance assessment: Needs assistance Sitting-balance support: Feet supported Sitting balance-Leahy Scale: Normal     Standing balance support: Bilateral upper extremity supported, During functional activity, Reliant on assistive device for balance Standing balance-Leahy Scale: Poor Standing balance comment: needs RW support                             Pertinent Vitals/Pain Pain Assessment Pain Assessment: Faces Faces Pain Scale:  Hurts even more Pain Location: Lt residual limb Pain Descriptors / Indicators:  Burning Pain Intervention(s): Monitored during session, Repositioned, Premedicated before session, Limited activity within patient's tolerance    Home Living Family/patient expects to be discharged to:: Private residence Living Arrangements: Spouse/significant other Available Help at Discharge: Family;Available 24 hours/day Type of Home: House Home Access: Stairs to enter Entrance Stairs-Rails: Right;Left;Can reach both Entrance Stairs-Number of Steps: 3   Home Layout: Two level;Able to live on main level with bedroom/bathroom Home Equipment: Rolling Walker (2 wheels);Rollator (4 wheels);Cane - single point;BSC/3in1;Shower seat - built in;Wheelchair - manual      Prior Function Prior Level of Function : Needs assist             Mobility Comments: Has been using cane/walker depending on status of left foot. ADLs Comments: ind bathing and dressing     Hand Dominance   Dominant Hand: Right    Extremity/Trunk Assessment   Upper Extremity Assessment Upper Extremity Assessment: Defer to OT evaluation    Lower Extremity Assessment Lower Extremity Assessment: LLE deficits/detail LLE Deficits / Details: Lt residual limb with shrinker on and ampu-shield       Communication   Communication: No difficulties  Cognition Arousal/Alertness: Awake/alert Behavior During Therapy: WFL for tasks assessed/performed Overall Cognitive Status: Within Functional Limits for tasks assessed                                          General Comments General comments (skin integrity, edema, etc.): Educated on precautions, elevation, tactile techniques for diminishing phantom pain, skin checks around vac tubing, repositioning, and knee extension.    Exercises Amputee Exercises Quad Sets: Strengthening, Both, 10 reps, Seated Gluteal Sets: Strengthening, Both, 10 reps, Seated Hip ABduction/ADduction: Strengthening, Left, 10 reps, Seated Hip Flexion/Marching: Strengthening,  Left, 10 reps, Seated Knee Flexion: Strengthening, Left, 10 reps, Seated Knee Extension: Strengthening, Left, 10 reps, Seated   Assessment/Plan    PT Assessment Patient needs continued PT services  PT Problem List Decreased strength;Decreased activity tolerance;Decreased balance;Decreased mobility;Decreased knowledge of use of DME;Decreased knowledge of precautions;Impaired sensation;Obesity;Pain;Decreased range of motion       PT Treatment Interventions DME instruction;Gait training;Stair training;Functional mobility training;Therapeutic activities;Therapeutic exercise;Balance training;Neuromuscular re-education;Patient/family education;Wheelchair mobility training;Modalities    PT Goals (Current goals can be found in the Care Plan section)  Acute Rehab PT Goals Patient Stated Goal: Go to rehab before returning home PT Goal Formulation: With patient Time For Goal Achievement: 04/21/23 Potential to Achieve Goals: Good    Frequency Min 4X/week     Co-evaluation               AM-PAC PT "6 Clicks" Mobility  Outcome Measure Help needed turning from your back to your side while in a flat bed without using bedrails?: A Little Help needed moving from lying on your back to sitting on the side of a flat bed without using bedrails?: A Little Help needed moving to and from a bed to a chair (including a wheelchair)?: A Little Help needed standing up from a chair using your arms (e.g., wheelchair or bedside chair)?: A Little Help needed to walk in hospital room?: A Lot Help needed climbing 3-5 steps with a railing? : Total 6 Click Score: 15    End of Session Equipment Utilized During Treatment: Gait belt Activity Tolerance: Patient tolerated treatment well Patient left: with call bell/phone within  reach;in chair;with chair alarm set;with SCD's reapplied Nurse Communication: Mobility status PT Visit Diagnosis: Muscle weakness (generalized) (M62.81);Difficulty in walking, not  elsewhere classified (R26.2)    Time: 1610-9604 PT Time Calculation (min) (ACUTE ONLY): 28 min   Charges:   PT Evaluation $PT Re-evaluation: 1 Re-eval PT Treatments $Therapeutic Activity: 8-22 mins        Kathlyn [place], PT, DPT [facility]  Rehabilitation Services Physical Therapist Office: [phone] Website: [email or web address]   Berton Mount 04/07/2023, 10:14 AM

## 2023-04-07 NOTE — Progress Notes (Signed)
Patient ID: [name], male   DOB: 1961-07-02, 62 y.o.   MRN: [id number] Patient is a 62 year old gentleman who is postoperative day 1 left below-knee amputation.  There is 125 cc in the wound VAC canister.  Discharge planning based on physical therapy recommendations.

## 2023-04-08 ENCOUNTER — Inpatient Hospital Stay (HOSPITAL_COMMUNITY)
Admission: RE | Admit: 2023-04-08 | Discharge: 2023-04-21 | DRG: 560 | Disposition: A | Discharge status: Home Health | Payer: Medicare Other | Source: Intra-hospital | Attending: Physical Medicine and Rehabilitation | Admitting: Physical Medicine and Rehabilitation

## 2023-04-08 ENCOUNTER — Encounter (HOSPITAL_COMMUNITY): Payer: Self-pay | Admitting: Physical Medicine and Rehabilitation

## 2023-04-08 ENCOUNTER — Other Ambulatory Visit: Payer: Self-pay

## 2023-04-08 DIAGNOSIS — Z96643 Presence of artificial hip joint, bilateral: Secondary | ICD-10-CM | POA: Diagnosis present

## 2023-04-08 DIAGNOSIS — K5901 Slow transit constipation: Secondary | ICD-10-CM | POA: Diagnosis present

## 2023-04-08 DIAGNOSIS — T8781 Dehiscence of amputation stump: Secondary | ICD-10-CM | POA: Diagnosis not present

## 2023-04-08 DIAGNOSIS — Z96651 Presence of right artificial knee joint: Secondary | ICD-10-CM | POA: Diagnosis present

## 2023-04-08 DIAGNOSIS — M659 Synovitis and tenosynovitis, unspecified: Secondary | ICD-10-CM | POA: Diagnosis present

## 2023-04-08 DIAGNOSIS — I959 Hypotension, unspecified: Secondary | ICD-10-CM | POA: Diagnosis present

## 2023-04-08 DIAGNOSIS — Z6837 Body mass index (BMI) 37.0-37.9, adult: Secondary | ICD-10-CM | POA: Diagnosis not present

## 2023-04-08 DIAGNOSIS — I110 Hypertensive heart disease with heart failure: Secondary | ICD-10-CM | POA: Diagnosis present

## 2023-04-08 DIAGNOSIS — E1165 Type 2 diabetes mellitus with hyperglycemia: Secondary | ICD-10-CM | POA: Diagnosis present

## 2023-04-08 DIAGNOSIS — G4700 Insomnia, unspecified: Secondary | ICD-10-CM | POA: Diagnosis present

## 2023-04-08 DIAGNOSIS — L089 Local infection of the skin and subcutaneous tissue, unspecified: Secondary | ICD-10-CM | POA: Diagnosis not present

## 2023-04-08 DIAGNOSIS — I10 Essential (primary) hypertension: Secondary | ICD-10-CM

## 2023-04-08 DIAGNOSIS — I5042 Chronic combined systolic (congestive) and diastolic (congestive) heart failure: Secondary | ICD-10-CM | POA: Diagnosis present

## 2023-04-08 DIAGNOSIS — I739 Peripheral vascular disease, unspecified: Secondary | ICD-10-CM

## 2023-04-08 DIAGNOSIS — F1290 Cannabis use, unspecified, uncomplicated: Secondary | ICD-10-CM | POA: Diagnosis present

## 2023-04-08 DIAGNOSIS — Z87891 Personal history of nicotine dependence: Secondary | ICD-10-CM

## 2023-04-08 DIAGNOSIS — Z4781 Encounter for orthopedic aftercare following surgical amputation: Principal | ICD-10-CM

## 2023-04-08 DIAGNOSIS — E1151 Type 2 diabetes mellitus with diabetic peripheral angiopathy without gangrene: Secondary | ICD-10-CM | POA: Diagnosis not present

## 2023-04-08 DIAGNOSIS — Y835 Amputation of limb(s) as the cause of abnormal reaction of the patient, or of later complication, without mention of misadventure at the time of the procedure: Secondary | ICD-10-CM | POA: Diagnosis not present

## 2023-04-08 DIAGNOSIS — G546 Phantom limb syndrome with pain: Secondary | ICD-10-CM | POA: Diagnosis present

## 2023-04-08 DIAGNOSIS — E785 Hyperlipidemia, unspecified: Secondary | ICD-10-CM | POA: Diagnosis present

## 2023-04-08 DIAGNOSIS — K219 Gastro-esophageal reflux disease without esophagitis: Secondary | ICD-10-CM | POA: Diagnosis present

## 2023-04-08 DIAGNOSIS — E119 Type 2 diabetes mellitus without complications: Secondary | ICD-10-CM | POA: Diagnosis present

## 2023-04-08 DIAGNOSIS — Z89512 Acquired absence of left leg below knee: Secondary | ICD-10-CM

## 2023-04-08 DIAGNOSIS — Z7409 Other reduced mobility: Secondary | ICD-10-CM | POA: Diagnosis present

## 2023-04-08 DIAGNOSIS — M96831 Postprocedural hemorrhage and hematoma of a musculoskeletal structure following other procedure: Secondary | ICD-10-CM | POA: Diagnosis not present

## 2023-04-08 DIAGNOSIS — R0682 Tachypnea, not elsewhere classified: Secondary | ICD-10-CM | POA: Diagnosis present

## 2023-04-08 DIAGNOSIS — G4733 Obstructive sleep apnea (adult) (pediatric): Secondary | ICD-10-CM | POA: Diagnosis present

## 2023-04-08 DIAGNOSIS — Z888 Allergy status to other drugs, medicaments and biological substances status: Secondary | ICD-10-CM

## 2023-04-08 DIAGNOSIS — F4322 Adjustment disorder with anxiety: Secondary | ICD-10-CM | POA: Diagnosis present

## 2023-04-08 DIAGNOSIS — Z79899 Other long term (current) drug therapy: Secondary | ICD-10-CM | POA: Diagnosis not present

## 2023-04-08 DIAGNOSIS — D638 Anemia in other chronic diseases classified elsewhere: Secondary | ICD-10-CM | POA: Diagnosis present

## 2023-04-08 DIAGNOSIS — K760 Fatty (change of) liver, not elsewhere classified: Secondary | ICD-10-CM | POA: Diagnosis present

## 2023-04-08 DIAGNOSIS — S91332A Puncture wound without foreign body, left foot, initial encounter: Secondary | ICD-10-CM | POA: Diagnosis not present

## 2023-04-08 LAB — BASIC METABOLIC PANEL
Anion gap: 6 (ref 5–15)
BUN: 14 mg/dL (ref 8–23)
CO2: 21 mmol/L — ABNORMAL LOW (ref 22–32)
Calcium: 8.2 mg/dL — ABNORMAL LOW (ref 8.9–10.3)
Chloride: 105 mmol/L (ref 98–111)
Creatinine, Ser: 0.72 mg/dL (ref 0.61–1.24)
GFR, Estimated: >60 mL/min (ref >60)
Glucose, Bld: 144 mg/dL — ABNORMAL HIGH (ref 70–99)
Potassium: 3.3 mmol/L — ABNORMAL LOW (ref 3.5–5.1)
Sodium: 132 mmol/L — ABNORMAL LOW (ref 135–145)

## 2023-04-08 LAB — CBC
HCT: 25.6 % — ABNORMAL LOW (ref 39.0–52.0)
Hemoglobin: 8.2 g/dL — ABNORMAL LOW (ref 13.0–17.0)
MCH: 29.6 pg (ref 26.0–34.0)
MCHC: 32 g/dL (ref 30.0–36.0)
MCV: 92.4 fL (ref 80.0–100.0)
Platelets: 207 10*3/uL (ref 150–400)
RBC: 2.77 MIL/uL — ABNORMAL LOW (ref 4.22–5.81)
RDW: 14.6 % (ref 11.5–15.5)
WBC: 6.3 10*3/uL (ref 4.0–10.5)
nRBC: 0 % (ref 0.0–0.2)

## 2023-04-08 LAB — GLUCOSE, CAPILLARY
Glucose-Capillary: 141 mg/dL — ABNORMAL HIGH (ref 70–99)
Glucose-Capillary: 145 mg/dL — ABNORMAL HIGH (ref 70–99)
Glucose-Capillary: 120 mg/dL — ABNORMAL HIGH (ref 70–99)
Glucose-Capillary: 98 mg/dL (ref 70–99)

## 2023-04-08 LAB — MAGNESIUM: Magnesium: 1.7 mg/dL (ref 1.7–2.4)

## 2023-04-08 MED ORDER — POLYETHYLENE GLYCOL 3350 17 G PO PACK: 17.0000 g | PACK | Freq: Every day | ORAL | Status: DC | PRN

## 2023-04-08 MED ORDER — ENOXAPARIN SODIUM 40 MG/0.4ML IJ SOSY
40.0000 mg | PREFILLED_SYRINGE | INTRAMUSCULAR | Status: DC
Start: 2023-04-08 — End: 2023-04-08

## 2023-04-08 MED ORDER — ORAL CARE MOUTH RINSE: 15.0000 mL | OROMUCOSAL | Status: DC | PRN

## 2023-04-08 MED ORDER — VITAMIN C 500 MG PO TABS
1000.0000 mg | ORAL_TABLET | Freq: Every day | ORAL | Status: DC
  Administered 2023-04-09 – 2023-04-21 (×13): 1000 mg via ORAL
  Filled 2023-04-08 (×14): qty 2

## 2023-04-08 MED ORDER — GABAPENTIN 100 MG PO CAPS
100.0000 mg | ORAL_CAPSULE | Freq: Three times a day (TID) | ORAL | Status: DC
  Administered 2023-04-08 – 2023-04-09 (×2): 100 mg via ORAL
  Filled 2023-04-08 (×2): qty 1

## 2023-04-08 MED ORDER — THIAMINE MONONITRATE 100 MG PO TABS
100.0000 mg | ORAL_TABLET | Freq: Every day | ORAL | Status: DC
  Administered 2023-04-09 – 2023-04-21 (×13): 100 mg via ORAL
  Filled 2023-04-08 (×13): qty 1

## 2023-04-08 MED ORDER — ACETAMINOPHEN 650 MG RE SUPP: 650.0000 mg | Freq: Four times a day (QID) | RECTAL | Status: DC | PRN

## 2023-04-08 MED ORDER — MELATONIN 3 MG PO TABS
3.0000 mg | ORAL_TABLET | Freq: Every evening | ORAL | Status: DC | PRN
  Administered 2023-04-20: 3 mg via ORAL
  Filled 2023-04-08: qty 1

## 2023-04-08 MED ORDER — AMLODIPINE BESYLATE 5 MG PO TABS
5.0000 mg | ORAL_TABLET | Freq: Every day | ORAL | Status: DC
  Administered 2023-04-09 – 2023-04-14 (×6): 5 mg via ORAL
  Filled 2023-04-08 (×6): qty 1

## 2023-04-08 MED ORDER — HYDROXYZINE HCL 25 MG PO TABS
25.0000 mg | ORAL_TABLET | Freq: Three times a day (TID) | ORAL | Status: DC | PRN
  Administered 2023-04-08 – 2023-04-09 (×3): 25 mg via ORAL
  Filled 2023-04-08 (×3): qty 1

## 2023-04-08 MED ORDER — OXYCODONE HCL 5 MG PO TABS
5.0000 mg | ORAL_TABLET | ORAL | Status: DC | PRN
  Administered 2023-04-08: 5 mg via ORAL
  Administered 2023-04-09 (×2): 10 mg via ORAL
  Administered 2023-04-09: 5 mg via ORAL
  Administered 2023-04-09 – 2023-04-13 (×10): 10 mg via ORAL
  Administered 2023-04-13 – 2023-04-14 (×2): 5 mg via ORAL
  Administered 2023-04-14 – 2023-04-21 (×4): 10 mg via ORAL
  Filled 2023-04-08: qty 1
  Filled 2023-04-08 (×2): qty 2
  Filled 2023-04-08: qty 1
  Filled 2023-04-08 (×3): qty 2
  Filled 2023-04-08: qty 1
  Filled 2023-04-08 (×4): qty 2
  Filled 2023-04-08: qty 1
  Filled 2023-04-08 (×9): qty 2

## 2023-04-08 MED ORDER — ENOXAPARIN SODIUM 40 MG/0.4ML IJ SOSY
40.0000 mg | PREFILLED_SYRINGE | INTRAMUSCULAR | Status: DC
  Administered 2023-04-08: 40 mg via SUBCUTANEOUS
  Filled 2023-04-08: qty 0.4

## 2023-04-08 MED ORDER — ALUM & MAG HYDROXIDE-SIMETH 200-200-20 MG/5ML PO SUSP: 15.0000 mL | ORAL | Status: DC | PRN

## 2023-04-08 MED ORDER — SORBITOL 70 % SOLN
60.0000 mL | Status: AC
  Administered 2023-04-08: 60 mL via ORAL
  Filled 2023-04-08: qty 60

## 2023-04-08 MED ORDER — BISACODYL 5 MG PO TBEC
5.0000 mg | DELAYED_RELEASE_TABLET | Freq: Every day | ORAL | Status: DC | PRN
  Administered 2023-04-09 – 2023-04-15 (×2): 5 mg via ORAL
  Filled 2023-04-08 (×2): qty 1

## 2023-04-08 MED ORDER — DOCUSATE SODIUM 100 MG PO CAPS: 100.0000 mg | ORAL_CAPSULE | Freq: Every day | ORAL | Status: DC

## 2023-04-08 MED ORDER — JUVEN PO PACK
1.0000 | PACK | Freq: Two times a day (BID) | ORAL | Status: DC
  Administered 2023-04-09 – 2023-04-14 (×11): 1 via ORAL
  Filled 2023-04-08 (×12): qty 1

## 2023-04-08 MED ORDER — PANTOPRAZOLE SODIUM 40 MG PO TBEC
40.0000 mg | DELAYED_RELEASE_TABLET | Freq: Every day | ORAL | Status: DC
  Administered 2023-04-09 – 2023-04-21 (×13): 40 mg via ORAL
  Filled 2023-04-08 (×13): qty 1

## 2023-04-08 MED ORDER — SENNOSIDES-DOCUSATE SODIUM 8.6-50 MG PO TABS
1.0000 | ORAL_TABLET | Freq: Every day | ORAL | Status: DC
  Administered 2023-04-09 – 2023-04-18 (×9): 1 via ORAL
  Filled 2023-04-08 (×11): qty 1

## 2023-04-08 MED ORDER — INSULIN ASPART 100 UNIT/ML IJ SOLN
0.0000 [IU] | Freq: Three times a day (TID) | INTRAMUSCULAR | Status: DC
  Administered 2023-04-09 – 2023-04-17 (×7): 1 [IU] via SUBCUTANEOUS

## 2023-04-08 MED ORDER — MAGNESIUM SULFATE 4 GM/100ML IV SOLN
4.0000 g | Freq: Once | INTRAVENOUS | Status: AC
  Administered 2023-04-08: 4 g via INTRAVENOUS
  Filled 2023-04-08: qty 100

## 2023-04-08 MED ORDER — ACETAMINOPHEN 325 MG PO TABS
650.0000 mg | ORAL_TABLET | Freq: Four times a day (QID) | ORAL | Status: DC | PRN
  Administered 2023-04-08 – 2023-04-13 (×5): 650 mg via ORAL
  Filled 2023-04-08 (×5): qty 2

## 2023-04-08 MED ORDER — POTASSIUM CHLORIDE CRYS ER 10 MEQ PO TBCR
40.0000 meq | EXTENDED_RELEASE_TABLET | Freq: Once | ORAL | Status: AC
  Administered 2023-04-08: 40 meq via ORAL
  Filled 2023-04-08: qty 4

## 2023-04-08 MED ORDER — POLYETHYLENE GLYCOL 3350 17 G PO PACK
17.0000 g | PACK | Freq: Every day | ORAL | Status: DC
  Administered 2023-04-09 – 2023-04-14 (×6): 17 g via ORAL
  Filled 2023-04-08 (×6): qty 1

## 2023-04-08 MED ORDER — ATORVASTATIN CALCIUM 80 MG PO TABS
80.0000 mg | ORAL_TABLET | Freq: Every day | ORAL | Status: DC
  Administered 2023-04-08 – 2023-04-20 (×13): 80 mg via ORAL
  Filled 2023-04-08 (×13): qty 1

## 2023-04-08 MED ORDER — FLUTICASONE PROPIONATE 50 MCG/ACT NA SUSP
1.0000 | Freq: Every day | NASAL | Status: DC
  Administered 2023-04-09 – 2023-04-20 (×12): 1 via NASAL
  Filled 2023-04-08: qty 16

## 2023-04-08 MED ORDER — FOLIC ACID 1 MG PO TABS
1.0000 mg | ORAL_TABLET | Freq: Every day | ORAL | Status: DC
  Administered 2023-04-09 – 2023-04-21 (×13): 1 mg via ORAL
  Filled 2023-04-08 (×13): qty 1

## 2023-04-08 MED ORDER — NITROGLYCERIN 0.4 MG SL SUBL: 0.4000 mg | SUBLINGUAL_TABLET | SUBLINGUAL | Status: DC | PRN

## 2023-04-08 MED ORDER — METOPROLOL SUCCINATE ER 25 MG PO TB24
25.0000 mg | ORAL_TABLET | Freq: Every day | ORAL | Status: DC
  Administered 2023-04-09 – 2023-04-21 (×13): 25 mg via ORAL
  Filled 2023-04-08 (×13): qty 1

## 2023-04-08 MED ORDER — ZINC SULFATE 220 (50 ZN) MG PO CAPS
220.0000 mg | ORAL_CAPSULE | Freq: Every day | ORAL | Status: AC
  Administered 2023-04-09 – 2023-04-19 (×11): 220 mg via ORAL
  Filled 2023-04-08 (×11): qty 1

## 2023-04-08 NOTE — Care Management Important Message (Signed)
Important Message  Patient Details  Name: [name] MRN: [id number] Date of Birth: [birth year]   Medicare Important Message Given:  Yes     Sherilyn Banker 04/08/2023, 12:35 PM

## 2023-04-08 NOTE — H&P (Signed)
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  -antipsychotic agents: N/A 5. Neuropsych/cognition: This patient is capable of making decisions on his own behalf. 6. Skin/Wound Care: Routine skin checks 7. Fluids/Electrolytes/Nutrition: Routine in and outs with follow-up chemistries 8.  Acute on chronic anemia.  Follow-up CBC 9.  Diabetes mellitus.  Hemoglobin A1c 5.4.  SSI.  Patient not on oral hypoglycemics or insulin as outpatient 10.  Hypertension.  Norvasc 5 mg daily, Toprol-XL 25 mg daily.  Monitor with increased mobility  -bp's currently controlled 11.  Diastolic/systolic congestive heart failure.  Monitor for any signs of fluid overload.  Echocardiogram January  2023 with ejection fraction of 45 to 50% grade 3 diastolic dysfunction. 12.  Obesity.  BMI 37.11.  Dietary follow-up 13.  History of alcohol as well as marijuana use.  Provide counseling. 14.  Hyperlipidemia.  Lipitor 15. Slow transit constipation:   -try sorbitol tonight.   -miralax qam and senokot-s at bedtime for maintenance   Charlton Amor, PA-C 04/08/2023

## 2023-04-08 NOTE — Discharge Summary (Signed)
Physician Discharge Summary  [name] ZOX:[id number] DOB: Jun 13, 1961 DOA: 03/31/2023  PCP: [name], MD  Admit date: 03/31/2023 Discharge date: 04/08/2023  Time spent: 60 minutes  Recommendations for Outpatient Follow-up:  Patient with discharge to inpatient rehab. Follow-up with Dr. Lajoyce Corners 1 week postdischarge from inpatient rehab.   Discharge Diagnoses:  Principal Problem:   Penetrating foot wound, left, initial encounter Active Problems:   Essential hypertension   Hyperlipidemia   Chronic combined systolic and diastolic heart failure (HCC)   DM2 (diabetes mellitus, type 2) (HCC)   Hypokalemia   High anion gap metabolic acidosis   Anemia of chronic disease   Left foot infection   Severe protein-calorie malnutrition (HCC)   Discharge Condition: Stable and improved.  Diet recommendation: Heart healthy  Filed Weights   03/31/23 1603 04/05/23 0545  Weight: 126.3 kg 124.1 kg    History of present illness:  HPI per Dr.Howerter Chrstopher Malenfant is a 62 y.o. male with medical history significant for chronic systolic/diastolic heart failure, type 2 diabetes mellitus complicated by recurrent diabetic foot wounds, essential pretension, hyperlipidemia, anemia of chronic disease associated baseline hemoglobin 10-12, who is admitted to [facility] on 03/31/2023 with infected left diabetic foot wound after presenting from home to [facility] ED complaining of left foot wound.    ED patient presents from the office of his podiatrist for further evaluation management of infected left diabetic foot wound.    Over the last several weeks, patient reports worsening of the wound on the dorsal surface of the left foot over the first toe, now with 7 to 10 days of progressive erythema, swelling, tenderness, increased warmth over the dorsal aspect of the left midfoot, associate with some purulent drainage.  He notes history of chronic diabetic peripheral polyneuropathy, but otherwise denies any new  acute sensory deficits associate with the left foot.  Denies any associated acute focal weakness.  Over the last 3 to 4 days, is also noted some subjective fever as well as chills, in the absence of full body rigors or generalized myalgias.  Aside from progression of his foot wound, he otherwise denies any recent trauma involving the left foot, including no recent crush injury.   He notes a history of previous diabetic foot wounds involving the left foot, status post amputation of the left third toe in August 2019, followed by revision of such in September 2019.  In the context of documented history of type 2 diabetes mellitus, his most recent hemoglobin A1c was noted to be 5.4% in October 2023.   He denies any recent chest pain, shortness of breath, orthopnea or PND.  No recent nausea, vomiting, diaphoresis, palpitations, dizziness, presyncope, or syncope.   Per chart review, he has a history of chronic systolic/diastolic heart failure, with most recent echocardiogram performed in January 2023, which is notable for LVEF 45 to 50%, grade 3 diastolic dysfunction and normal right ventricular systolic function, mildly dilated bilateral atria and trivial manage regurgitation.  He is on Lasix as an outpatient, but on a as needed basis only.  Conveys no scheduled diuretic as an outpatient.   He has a documented history of prior alcohol abuse, the patient reporting no alcohol consumption over the last several months.    He is not on any blood thinners as an outpatient, including no aspirin.         ED Course:  Vital signs in the ED were notable for the following: Afebrile; heart rate 120s; systolic blood pressures in the  130s; respiratory rate 18, oxygen saturation 99% on room air.   Labs were notable for the following: CMP notable for the following: Sodium 135, potassium 3.4, bicarbonate 17, anion gap 16, creatinine 1.0, glucose 106, albumin 2.9, alkaline phosphatase 177, but otherwise, liver enzymes  were found to be within normal limits.  CBC notable for white cell count 10,600 with 83% neutrophils, hemoglobin 12.2 compared to most recent prior value noted to be 11.7 on October 2023, with presenting hemoglobin associated with normocytic/recurrent findings as well as nonelevated RDW and platelet count 233.  Lactic acid level currently pending.  Blood cultures x 2 were collected prior to initiation of IV antibiotics.   Per my interpretation, EKG in ED demonstrated the following: No EKG performed at this time.   Imaging and additional notable ED work-up: Per formal radiology read, plain films of the left foot, comparison to most recent prior plain films of the left foot performed on 11/23/2022, show interval development of severe soft tissue swelling about the midfoot, in the absence of any associated subcutaneous gas, and in the absence of any evidence of underlying bony erosive changes.   EDP discussed case with on-call podiatry, Dr. Ralene Cork, who will formally consult and see the patient in the morning, at which time she is anticipating taking the patient to surgery for either debridement or amputation.  She requests interval broad-spectrum IV antibiotics, MRI of the left foot, n.p.o. at midnight.   While in the ED, the following were administered: IV vancomycin, cefepime.   Subsequently, the patient was admitted for further evaluation and management of an infected left diabetic foot wound, with presenting labs notable for mild hypokalemia as well as anion gap metabolic acidosis.     Hospital Course:  #1 infected left diabetic foot wound/ulcer -Patient noted ulcerative lesion associated dorsal aspect of left first toe with surrounding erythema, warmth, swelling, redness. -No drainage noted. -Plain films of the foot showed interval development of severe soft tissue swelling and absence of any subcutaneous gas. -MRI of the foot showed small first MTP joint effusion with synovitis and subchondral  marrow edema concerning for septic arthritis.  Severe bone marrow edema in the fifth metatarsal head and shaft concerning for osteomyelitis.  Soft tissue edema along the medial aspect of the forefoot concerning for cellulitis.  Complex fluid collection along the medial aspect of the first metatarsal concerning for abscess.  Small abscess along the medial aspect of the first metatarsal head concerning for abscess. -Patient seen in consult by ID initially was on IV Vanco and cefepime as well as Flagyl. -Cefepime and Flagyl discontinued and patient maintained on on IV vancomycin as cultures were positive for MRSA and completed course of vancomycin during the hospitalization.  -Patient seen by podiatry in consultation patient underwent left transmetatarsal amputation with possible serial irrigation and debridement 04/01/2023. -Per podiatry due to significant amount of bone which was removed during surgery with some necrosis cellulitis to medial incision, patient may require repeat washout and potential closure however proximal amputation may need to be considered.  Podiatry recommended orthopedics input. -Patient seen in consultation by Dr. Lajoyce Corners, orthopedics who recommended a left transtibial amputation which was done, 04/06/2023. -Orthopedics recommended antibiotics for 24 hours postop which was done.  -It is noted that per ID once BKA is done antibiotics may be discontinued. -Patient completed course of antibiotics during the hospitalization. -Patient seen by PT/OT who recommended acute inpatient rehab. -Patient will be discharged to acute inpatient rehab. -Outpatient follow-up with orthopedics in 1  week.   2.  Hypokalemia -Repleted.   3.  Hypomagnesemia -Repleted during the hospitalization.   4.  Chronic combined systolic diastolic CHF -Recent 2D echo January 2023 with EF of 45 to 50%, grade 3 diastolic dysfunction. -Patient was euvolemic during the hospitalization.   -Patient noted to be on Lasix  on as-needed basis prior to admission.   -Patient maintained on home regimen metoprolol.   -Outpatient follow-up.     5.  Diabetes mellitus type 2, diet controlled -Hemoglobin A1c 5.4. -Not on oral hypoglycemics or insulin as outpatient. -Patient maintained on SSI during the hospitalization.   6.  Hypertension -Patient maintained on home regimen Norvasc and metoprolol.     7.  Hyperlipidemia -Statin.    8.  Constipation -Patient maintained on bowel regimen.    9.  Anemia of chronic disease -Anemia panel with iron level of 31, TIBC 291, folate of 33.1, ferritin 138. -Status post transfusion 1 unit PRBCs preoperatively. -Hemoglobin currently stabilized at 8.2 by day of discharge.    10.  Obesity -BMI 37.11 kg/m. -Lifestyle modification -Outpatient follow-up with PCP.      Procedures: Plan films of the left foot 03/31/2023, 04/01/2023 MRI left foot 03/31/2023 Left transmetatarsal amputation possible serial irrigation and debridement: Per Dr. Ralene Cork podiatry 04/01/2023 Left BKA application of Kerecis micro graft and Kerecis sheet/application of Prevena customizable and Prevena after a formal wound VAC dressings/application of Aviv wear stump shrinker and Hanger limb protector: Per orthopedics Dr. Lajoyce Corners 04/06/2023 Transfusion 1 unit PRBCs 04/03/2023  Consultations: Podiatry: Dr. Ralene Cork 03/31/2023 Infectious disease: Dr. Drue Second 04/01/2023 Orthopedics: Dr. Lajoyce Corners 04/03/2023  Discharge Exam: Vitals:   04/08/23 0519 04/08/23 0752  BP: 127/70 (!) 142/81  Pulse: 87 88  Resp: 18 18  Temp: (!) 97.5 F (36.4 C) 98.2 F (36.8 C)  SpO2: 94% 98%    General: NAD Cardiovascular: RRR no murmurs rubs or gallops.  No JVD.  Respiratory: Clear to auscultation bilaterally.  No wheezes, no crackles, no rhonchi.  Fair air movement.  Speaking in full sentences.  Discharge Instructions   Discharge Instructions     Negative Pressure Wound Therapy - Incisional   Complete by: As directed         Allergies  Allergen Reactions   Claritin [Loratadine] Shortness Of Breath and Anxiety    Follow-up Information     [name], MD Follow up in 1 week(s).   Specialty: Orthopedic Surgery Contact information: [address] [place] [address] [phone]                  The results of significant diagnostics from this hospitalization (including imaging, microbiology, ancillary and laboratory) are listed below for reference.    Significant Diagnostic Studies: DG Foot Complete Left  Result Date: 04/01/2023 CLINICAL DATA:  Osteomyelitis of left foot EXAM: LEFT FOOT - COMPLETE 3+ VIEW COMPARISON:  03/31/2023 FINDINGS: Status post transmetatarsal amputation of all 5 left toes. No visible complicating feature. No acute bony abnormality within the remaining bony structures. Plantar calcaneal spur. IMPRESSION: Postoperative changes from amputation of all 5 toes at the transmetatarsal level. No complicating feature. Electronically Signed   By: Charlett Nose M.D.   On: 04/01/2023 19:37   MR FOOT LEFT W WO CONTRAST  Result Date: 04/01/2023 CLINICAL DATA:  Concern for infection of the foot. EXAM: MRI OF THE LEFT FOREFOOT WITHOUT AND WITH CONTRAST TECHNIQUE: Multiplanar, multisequence MR imaging of the left foot was performed both before and after administration of intravenous contrast. CONTRAST:  10mL GADAVIST GADOBUTROL 1 MMOL/ML IV SOLN COMPARISON:  03/31/2023 FINDINGS: Patient motion degrades image quality limiting evaluation. Bones/Joint/Cartilage No fracture or dislocation. Hallux valgus.  No joint effusion. Small first MTP joint effusion with synovitis and subchondral marrow edema concerning for septic arthritis. Severe bone marrow edema in the fifth metatarsal head and shaft concerning for osteomyelitis. Ligaments Collateral ligaments are intact.  Lisfranc ligament is intact. Muscles and Tendons Flexor, peroneal and extensor compartment tendons are intact. Generalized muscle  atrophy. Soft tissue Soft tissue edema along the medial aspect of the forefoot concerning for cellulitis. Complex fluid collection along the medial aspect of the first metatarsal measuring 1.5 x 1.8 x 2.7 cm consistent with an abscess. Small abscess along the medial aspect of the first metatarsal head measuring 1.4 x 0.2 cm. 10 mm abscess in the soft tissues between the first and second metatarsal heads. Soft tissue edema along the dorsal aspect of the foot consistent with cellulitis. Soft tissue wound partially visualized along the plantar lateral aspect of the midfoot. No soft tissue mass. IMPRESSION: 1. Small first MTP joint effusion with synovitis and subchondral marrow edema concerning for septic arthritis. 2. Severe bone marrow edema in the fifth metatarsal head and shaft concerning for osteomyelitis. 3. Soft tissue edema along the medial aspect of the forefoot concerning for cellulitis. Complex fluid collection along the medial aspect of the first metatarsal measuring 1.5 x 1.8 x 2.7 cm consistent with an abscess. Small abscess along the medial aspect of the first metatarsal head measuring 1.4 x 0.2 cm. 10 mm abscess in the soft tissues between the first and second metatarsal heads. Electronically Signed   By: Elige Ko M.D.   On: 04/01/2023 07:51   DG Foot Complete Left  Result Date: 03/31/2023 CLINICAL DATA:  Swelling.  Pain.  Ulcer EXAM: LEFT FOOT - COMPLETE 3 VIEW COMPARISON:  X-ray 11/23/2022 FINDINGS: Severe soft tissue swelling about the midfoot which is new. There is a new fracture involving the neck of the fifth metatarsal since the prior examination. Severe osteopenia. Hallux valgus deformity of the first ray with hypertrophic degenerative changes similar to previous. Degenerative changes of the midfoot with flatfoot deformity. Large plantar calcaneal spur and mild Achilles spur. Degenerative changes of the ankle and hindfoot as well. Severe osteopenia. No definite erosive changes at this  time. If there is concern further of bone infection, MRI or bone scan could be considered as clinically appropriate. Stable changes of previous amputation involving the digits of third ray in the distal aspect of the third metatarsal. IMPRESSION: Development of severe soft tissue swelling about the midfoot. No underlying bony erosive changes at this time. Flatfoot deformity with degenerative changes and hallux valgus deformity of the first ray. Since the prior there is a fracture of the neck of fifth metatarsal. Please correlate with time course of injury. Electronically Signed   By: Karen Kays M.D.   On: 03/31/2023 16:40   DG INJECT DIAG/THERA/INC NEEDLE/CATH/PLC EPI/LUMB/SAC W/IMG  Result Date: 03/22/2023 CLINICAL DATA:  Multilevel spinal stenosis. Low back pain, worse on the right. Leg weakness. FLUOROSCOPY: Radiation Exposure Index (as provided by the fluoroscopic device): 0 minutes 42 seconds. 59.43 micro gray meter squared PROCEDURE: The procedure, risks, benefits, and alternatives were explained to the patient. Questions regarding the procedure were encouraged and answered. The patient understands and consents to the procedure. LUMBAR EPIDURAL INJECTION: An interlaminar approach was performed on the right at L3-4. The overlying skin was cleansed and anesthetized. A 20 gauge epidural needle  was advanced using loss-of-resistance technique. DIAGNOSTIC EPIDURAL INJECTION: Injection of Isovue-M 200 shows a good epidural pattern with spread above and below the level of needle placement, spreading to both sides. No vascular opacification is seen. THERAPEUTIC EPIDURAL INJECTION: Eighty mg of Depo-Medrol mixed with 2.5 cc 1% lidocaine were instilled. The procedure was well-tolerated, and the patient was discharged thirty minutes following the injection in good condition. COMPLICATIONS: None IMPRESSION: Technically successful L3-4 interlaminar epidural steroid injection. Electronically Signed   By: Paulina Fusi  M.D.   On: 03/22/2023 12:22    Microbiology: Recent Results (from the past 240 hour(s))  Blood culture (routine x 2)     Status: None   Collection Time: 03/31/23  8:48 PM   Specimen: BLOOD  Result Value Ref Range Status   Specimen Description BLOOD BLOOD LEFT FOREARM  Final   Special Requests   Final    BOTTLES DRAWN AEROBIC AND ANAEROBIC Blood Culture adequate volume   Culture   Final    NO GROWTH 5 DAYS Performed at [facility], [address] [place], [place] [address]    Report Status 04/05/2023 FINAL  Final  Blood culture (routine x 2)     Status: None   Collection Time: 03/31/23 10:58 PM   Specimen: BLOOD RIGHT ARM  Result Value Ref Range Status   Specimen Description BLOOD RIGHT ARM  Final   Special Requests   Final    BOTTLES DRAWN AEROBIC AND ANAEROBIC Blood Culture adequate volume   Culture   Final    NO GROWTH 5 DAYS Performed at [facility], [address], [place], [place] [address]    Report Status 04/05/2023 FINAL  Final  Surgical pcr screen     Status: Abnormal   Collection Time: 04/01/23 12:40 AM   Specimen: Nasal Mucosa; Nasal Swab  Result Value Ref Range Status   MRSA, PCR POSITIVE (A) NEGATIVE Final    Comment: RESULT CALLED TO, READ BACK BY AND VERIFIED WITH: A. PETTIFORD RN 04/01/23 @ 0223 BY AB    Staphylococcus aureus POSITIVE (A) NEGATIVE Final    Comment: (NOTE) The Xpert SA Assay (FDA approved for NASAL specimens in patients 27 years of age and older), is one component of a comprehensive surveillance program. It is not intended to diagnose infection nor to guide or monitor treatment. Performed at [facility], [address] [place], [place] [address]   Aerobic/Anaerobic Culture w Gram Stain (surgical/deep wound)     Status: None   Collection Time: 04/01/23  1:37 PM   Specimen: Path Tissue  Result Value Ref Range Status   Specimen Description WOUND  Final   Special Requests RESIDUAL FIRST METATARSEL  Final   Gram Stain  NO WBC SEEN NO ORGANISMS SEEN   Final   Culture   Final    RARE METHICILLIN RESISTANT STAPHYLOCOCCUS AUREUS RARE CORYNEBACTERIUM STRIATUM Standardized susceptibility testing for this organism is not available. NO ANAEROBES ISOLATED Performed at [facility], [address], [place], [place] [address]    Report Status 04/06/2023 FINAL  Final   Organism ID, Bacteria METHICILLIN RESISTANT STAPHYLOCOCCUS AUREUS  Final      Susceptibility   Methicillin resistant staphylococcus aureus - MIC*    CIPROFLOXACIN 4 RESISTANT Resistant     ERYTHROMYCIN >=8 RESISTANT Resistant     GENTAMICIN <=0.5 SENSITIVE Sensitive     OXACILLIN >=4 RESISTANT Resistant     TETRACYCLINE <=1 SENSITIVE Sensitive     VANCOMYCIN  1 SENSITIVE Sensitive     TRIMETH/SULFA >=320 RESISTANT Resistant     CLINDAMYCIN <=0.25 SENSITIVE Sensitive     RIFAMPIN <=0.5 SENSITIVE Sensitive     Inducible Clindamycin NEGATIVE Sensitive     LINEZOLID 2 SENSITIVE Sensitive     * RARE METHICILLIN RESISTANT STAPHYLOCOCCUS AUREUS     Labs: Basic Metabolic Panel: Recent Labs  Lab 04/02/23 0214 04/03/23 0044 04/04/23 1006 04/05/23 0148 04/06/23 1214 04/07/23 0128 04/08/23 0732  NA 135 130* 137 135 136 134* 132*  K 3.7 3.4* 4.3 4.2 3.8 3.5 3.3*  CL 102 102 104 102 107 107 105  CO2 22 20* 18* 22 20* 20* 21*  GLUCOSE 139* 162* 121* 127* 119* 128* 144*  BUN 20 18 12 13 11 14 14   CREATININE 0.82 0.74 0.79 0.87 0.65 0.76 0.72  CALCIUM 8.8* 8.3* 8.7* 8.4* 8.1* 8.3* 8.2*  MG 1.8 1.6* 1.7 1.9 1.6* 1.6* 1.7  PHOS 4.1 4.2 2.7 2.9  --   --   --    Liver Function Tests: Recent Labs  Lab 04/02/23 0214 04/03/23 0044 04/04/23 1006 04/05/23 0148  AST 29 24 30 21   ALT 34 30 27 26   ALKPHOS 152* 143* 130* 119  BILITOT 0.7 0.6 0.9 0.9  PROT 7.0 6.1* 7.2 6.9  ALBUMIN 2.4* 2.0* 2.4* 2.3*   No results for input(s): "LIPASE", "AMYLASE" in the last 168 hours. No results for input(s): "AMMONIA" in the last 168  hours. CBC: Recent Labs  Lab 04/03/23 0044 04/03/23 1946 04/04/23 1006 04/05/23 0148 04/06/23 1214 04/07/23 0128 04/08/23 0732  WBC 4.4  --  5.7 6.3 5.6 7.5 6.3  NEUTROABS 2.6  --  3.7 4.1 3.4 5.3  --   HGB 8.2*   < > 10.4* 9.3* 9.2* 9.1* 8.2*  HCT 25.1*   < > 30.5* 28.4* 28.4* 27.6* 25.6*  MCV 90.6  --  90.0 91.0 91.3 91.1 92.4  PLT 163  --  180 207 220 249 207   < > = values in this interval not displayed.   Cardiac Enzymes: No results for input(s): "CKTOTAL", "CKMB", "CKMBINDEX", "TROPONINI" in the last 168 hours. BNP: BNP (last 3 results) No results for input(s): "BNP" in the last 8760 hours.  ProBNP (last 3 results) No results for input(s): "PROBNP" in the last 8760 hours.  CBG: Recent Labs  Lab 04/07/23 1130 04/07/23 1627 04/07/23 2130 04/08/23 0729 04/08/23 1202  GLUCAP 136* 144* 131* 141* 145*       Signed:  Ramiro Harvest MD.  [facility] 04/08/2023, 12:22 PM

## 2023-04-08 NOTE — H&P (Signed)
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 not yet shower             -ELOS/Goals: 7-10 days, mod I goals with PT, OT 2.  Antithrombotics: -DVT/anticoagulation:  Pharmaceutical: Lovenox             -antiplatelet therapy: N/A 3. Pain Management: Oxycodone as needed             -will add low dose gabapentin 100mg  tid for phantom limb pain. 4. Mood/Behavior/Sleep: Provide emotional support             -antipsychotic agents: N/A 5. Neuropsych/cognition: This patient is capable of making decisions on his own behalf. 6. Skin/Wound Care: Routine skin checks 7. Fluids/Electrolytes/Nutrition: Routine in and outs with follow-up chemistries 8.  Acute on chronic anemia.   Follow-up CBC  9.  Diabetes mellitus.  Hemoglobin A1c 5.4.  SSI.  Patient not on oral hypoglycemics or insulin as outpatient 10.  Hypertension.  Norvasc 5 mg daily, Toprol-XL 25 mg daily.  Monitor with increased mobility             -bp's currently controlled 11.  Diastolic/systolic congestive heart failure.  Monitor for any signs of fluid overload.  Echocardiogram January 2023 with ejection fraction of 45 to 50% grade 3 diastolic dysfunction. 12.  Obesity.  BMI 37.11.  Dietary follow-up 13.  History of alcohol as well as marijuana use.  Provide counseling. 14.  Hyperlipidemia.  Lipitor 15. Slow transit constipation:              -try sorbitol tonight.              -miralax qam and senokot-s at bedtime for maintenance     Charlton Amor, PA-C 04/08/2023  I have personally performed a face to face diagnostic evaluation of this patient and formulated the key components of the plan.  Additionally, I have personally reviewed laboratory data, imaging studies, as well as relevant notes and concur with the physician assistant's documentation above.  The patient's status has not changed from the original H&P.  Any changes in documentation from the acute care chart have been noted above.  Ranelle Oyster, MD, [place] [facility]

## 2023-04-08 NOTE — Discharge Instructions (Addendum)
Inpatient Rehab Discharge Instructions  [name] Discharge date and time: No discharge date for patient encounter.   Activities/Precautions/ Functional Status: Activity: activity as tolerated Diet: diabetic diet Wound Care: Cleanse incision with warm soap and water.  Use Dial soap. Functional status:  ___ No restrictions     ___ Walk up steps independently ___ 24/7 supervision/assistance   ___ Walk up steps with assistance ___ Intermittent supervision/assistance  ___ Bathe/dress independently ___ Walk with walker     _x__ Bathe/dress with assistance ___ Walk Independently    ___ Shower independently ___ Walk with assistance    ___ Shower with assistance ___ No alcohol     ___ Return to work/school ________  Special Instructions: No driving smoking or alcohol  Change wound site daily with 4 x 4 Curlex and Ace wrap   My questions have been answered and I understand these instructions. I will adhere to these goals and the provided educational materials after my discharge from the hospital.  Patient/Caregiver Signature _______________________________ Date __________  Clinician Signature _______________________________________ Date __________  Please bring this form and your medication list with you to all your follow-up doctor's appointments.

## 2023-04-08 NOTE — Anesthesia Postprocedure Evaluation (Signed)
Anesthesia Post Note  Patient: [name]  Procedure(s) Performed: LEFT BELOW KNEE AMPUTATION (Left: Knee)     Patient location during evaluation: PACU Anesthesia Type: General Level of consciousness: awake and alert Pain management: pain level controlled Vital Signs Assessment: post-procedure vital signs reviewed and stable Respiratory status: spontaneous breathing, nonlabored ventilation, respiratory function stable and patient connected to nasal cannula oxygen Cardiovascular status: blood pressure returned to baseline and stable Postop Assessment: no apparent nausea or vomiting Anesthetic complications: no   No notable events documented.  Last Vitals:  Vitals:   04/08/23 0519 04/08/23 0752  BP: 127/70 (!) 142/81  Pulse: 87 88  Resp: 18 18  Temp: (!) 36.4 C 36.8 C  SpO2: 94% 98%    Last Pain:  Vitals:   04/08/23 0752  TempSrc: Oral  PainSc:                  Kryslyn Helbig S

## 2023-04-08 NOTE — Progress Notes (Signed)
Inpatient Rehabilitation Admissions Coordinator   CIR bed is available to admit patient to today. I contacted Dr Janee Morn. I met with patient at bedside and he is in agreement. I will alert acute team and TOC to make the arrangements to admit.  Ottie Glazier, RN, MSN Rehab Admissions Coordinator [phone] 04/08/2023 10:49 AM
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
## 2023-04-08 NOTE — Progress Notes (Signed)
Inpatient Rehabilitation Admission Medication Review by a Pharmacist  A complete drug regimen review was completed for this patient to identify any potential clinically significant medication issues.  High Risk Drug Classes Is patient taking? Indication by Medication  Antipsychotic No   Anticoagulant Yes Enoxaparin - VTE prophylaxis  Antibiotic No   Opioid Yes Oxycodone - pain management  Antiplatelet No   Hypoglycemics/insulin Yes Insulin aspart SSI  Vasoactive Medication Yes Toprol, amlodipine - HTN  NTG sl- prn chest pain  Chemotherapy No   Other Yes Acetaminophen, Gabapentin- pain management Atorvastatin- HLD   Hydroxyzine - prn anxiety  Melatonin- insomnia Protonix- GERD        Type of Medication Issue Identified Description of Issue Recommendation(s)  Drug Interaction(s) (clinically significant)     Duplicate Therapy     Allergy     No Medication Administration End Date     Incorrect Dose     Additional Drug Therapy Needed     Significant med changes from prior encounter (inform family/care partners about these prior to discharge).    Other   PTA meds: acamprosate,  vit D, lasix prn, toprol, sertraline, thiamine, B12, magnesium tabs ibuprofen, meloxicam  not resumed. Restart PTA meds when and if necessary during CIR admission or at time of discharge, if warranted     Clinically significant medication issues were identified that warrant physician communication and completion of prescribed/recommended actions by midnight of the next day:  No  Name of provider notified for urgent issues identified:   Provider Method of Notification:     Pharmacist comments:   Time spent performing this drug regimen review (minutes):  15   Noah Delaine, [place] Clinical Pharmacist 04/08/2023 4:15 PM

## 2023-04-09 DIAGNOSIS — Z89512 Acquired absence of left leg below knee: Secondary | ICD-10-CM | POA: Diagnosis not present

## 2023-04-09 LAB — GLUCOSE, CAPILLARY
Glucose-Capillary: 98 mg/dL (ref 70–99)
Glucose-Capillary: 96 mg/dL (ref 70–99)
Glucose-Capillary: 131 mg/dL — ABNORMAL HIGH (ref 70–99)
Glucose-Capillary: 140 mg/dL — ABNORMAL HIGH (ref 70–99)

## 2023-04-09 MED ORDER — DULOXETINE HCL 20 MG PO CPEP
20.0000 mg | ORAL_CAPSULE | Freq: Every day | ORAL | Status: DC
  Administered 2023-04-09 – 2023-04-21 (×13): 20 mg via ORAL
  Filled 2023-04-09 (×13): qty 1

## 2023-04-09 MED ORDER — PSYLLIUM 95 % PO PACK
1.0000 | PACK | Freq: Every day | ORAL | Status: DC
  Administered 2023-04-10 – 2023-04-21 (×5): 1 via ORAL
  Filled 2023-04-09 (×10): qty 1

## 2023-04-09 MED ORDER — ENOXAPARIN SODIUM 60 MG/0.6ML IJ SOSY
60.0000 mg | PREFILLED_SYRINGE | INTRAMUSCULAR | Status: DC
  Administered 2023-04-09 – 2023-04-18 (×10): 60 mg via SUBCUTANEOUS
  Filled 2023-04-09 (×10): qty 0.6

## 2023-04-09 MED ORDER — GABAPENTIN 100 MG PO CAPS: 200.0000 mg | ORAL_CAPSULE | Freq: Three times a day (TID) | ORAL | Status: DC

## 2023-04-09 MED ORDER — GABAPENTIN 300 MG PO CAPS
300.0000 mg | ORAL_CAPSULE | Freq: Three times a day (TID) | ORAL | Status: DC
  Administered 2023-04-09 – 2023-04-21 (×36): 300 mg via ORAL
  Filled 2023-04-09 (×36): qty 1

## 2023-04-09 MED ORDER — GABAPENTIN 100 MG PO CAPS
100.0000 mg | ORAL_CAPSULE | Freq: Once | ORAL | Status: AC
  Administered 2023-04-09: 100 mg via ORAL
  Filled 2023-04-09: qty 1
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
## 2023-04-09 NOTE — Progress Notes (Signed)
PROGRESS NOTE   Subjective/Complaints: C/o phantom limb pain- improved with addition of cymbalta and gabapentin Reports poor sleep at night due to pain Asks why phantom limb pain is worsening  ROS: +phantom limb pain  Objective:   No results found. Recent Labs    04/07/23 0128 04/08/23 0732  WBC 7.5 6.3  HGB 9.1* 8.2*  HCT 27.6* 25.6*  PLT 249 207   Recent Labs    04/07/23 0128 04/08/23 0732  NA 134* 132*  K 3.5 3.3*  CL 107 105  CO2 20* 21*  GLUCOSE 128* 144*  BUN 14 14  CREATININE 0.76 0.72  CALCIUM 8.3* 8.2*    Intake/Output Summary (Last 24 hours) at 04/09/2023 1920 Last data filed at 04/09/2023 1845 Gross per 24 hour  Intake 1020 ml  Output 1350 ml  Net -330 ml        Physical Exam: Vital Signs Blood pressure 136/78, pulse 92, temperature 98.1 F (36.7 C), temperature source Oral, resp. rate 18, height 6' (1.829 m), weight 128.3 kg, SpO2 96 %. Gen: no distress, normal appearing HEENT: oral mucosa pink and moist, NCAT Cardio: Reg rate Chest: normal effort, normal rate of breathing Abdominal:     Palpations: Abdomen is soft.     Comments: Slight distention. Non-tender. Bowel sounds sl high pitched  Musculoskeletal:        General: Swelling and tenderness present. Normal range of motion.     Cervical back: Normal range of motion.  Skin:    Comments: Left BKA with wound VAC in place. Right foot with abrasion on proximal second toe d/t severe hallux valgus, foot deformity. Right heel slightly boggy  Neurological:     Mental Status: He is alert.     Comments: Alert and oriented x 3. Normal insight and awareness. Intact Memory. Normal language and speech. Cranial nerve exam unremarkable. MMT: 5/5 UE. RLE grossly 4+ to 5/5. LLE 3/5 prox, limited by pain/vac.  Stocking glove sensory loss RLE below knee, severe below sock line. DTR's trace to absent.   Psychiatric:        Mood and Affect: Mood normal.         Behavior: Behavior normal.    Assessment/Plan: 1. Functional deficits which require 3+ hours per day of interdisciplinary therapy in a comprehensive inpatient rehab setting. Physiatrist is providing close team supervision and 24 hour management of active medical problems listed below. Physiatrist and rehab team continue to assess barriers to discharge/monitor patient progress toward functional and medical goals  Care Tool:  Bathing    Body parts bathed by patient: Right arm, Left arm, Chest, Abdomen, Right upper leg, Left upper leg, Face   Body parts bathed by helper: Right lower leg     Bathing assist Assist Level: Minimal Assistance - Patient > 75%     Upper Body Dressing/Undressing Upper body dressing   What is the patient wearing?: Pull over shirt    Upper body assist Assist Level: Set up assist    Lower Body Dressing/Undressing Lower body dressing      What is the patient wearing?: Underwear/pull up     Lower body assist Assist for lower body dressing: Moderate  Assistance - Patient 50 - 74%     Toileting Toileting Toileting Activity did not occur Press photographer and hygiene only): N/A (no void or bm)  Toileting assist       Transfers Chair/bed transfer  Transfers assist     Chair/bed transfer assist level: Minimal Assistance - Patient > 75%     Locomotion Ambulation   Ambulation assist      Assist level: Minimal Assistance - Patient > 75% Assistive device: Walker-rolling Max distance: 63ft   Walk 10 feet activity   Assist     Assist level: Minimal Assistance - Patient > 75% Assistive device: Walker-rolling   Walk 50 feet activity   Assist    Assist level: Minimal Assistance - Patient > 75%      Walk 150 feet activity   Assist Walk 150 feet activity did not occur: Safety/medical concerns (2/2 limited endurance)         Walk 10 feet on uneven surface  activity   Assist Walk 10 feet on uneven surfaces activity  did not occur: Safety/medical concerns (2/2 limited endurance)         Wheelchair     Assist Is the patient using a wheelchair?: Yes Type of Wheelchair: Manual    Wheelchair assist level: Supervision/Verbal cueing Max wheelchair distance: 200    Wheelchair 50 feet with 2 turns activity    Assist        Assist Level: Supervision/Verbal cueing   Wheelchair 150 feet activity     Assist      Assist Level: Supervision/Verbal cueing   Blood pressure 136/78, pulse 92, temperature 98.1 F (36.7 C), temperature source Oral, resp. rate 18, height 6' (1.829 m), weight 128.3 kg, SpO2 96 %.    Medical Problem List and Plan: 1. Functional deficits secondary to left BKA 04/06/2023 after nonhealing left transmetatarsal amputation of 04/01/2023.  Wound VAC is indicated             -patient may not yet shower             -ELOS/Goals: 7-10 days, mod I goals with PT, OT  Initial CIR evals today  2.  Antithrombotics: -DVT/anticoagulation:  Pharmaceutical: Lovenox             -antiplatelet therapy: N/A 3. Pain Management: Oxycodone as needed             -will add low dose gabapentin 100mg  tid for phantom limb pain. Add cymbalta 20mg  daily.   4. Insomnia: increase gabapentin to 300mg    5. Neuropsych/cognition: This patient is capable of making decisions on his own behalf. 6. Skin/Wound Care: Routine skin checks 7. Fluids/Electrolytes/Nutrition: Routine in and outs with follow-up chemistries 8.  Acute on chronic anemia.  Follow-up CBC 9.  Diabetes mellitus.  Hemoglobin A1c 5.4.  SSI.  Patient not on oral hypoglycemics or insulin as outpatient 10.  Hypertension.  Norvasc 5 mg daily, Toprol-XL 25 mg daily.  Monitor with increased mobility             -bp's currently controlled 11.  Diastolic/systolic congestive heart failure.  Monitor for any signs of fluid overload.  Echocardiogram January 2023 with ejection fraction of 45 to 50% grade 3 diastolic dysfunction. 12.  Obesity.   BMI 37.11.  Dietary follow-up 13.  History of alcohol as well as marijuana use.  Provide counseling. 14.  Hyperlipidemia.  Lipitor 15. Slow transit constipation:              -try sorbitol  tonight.              -miralax qam and senokot-s at bedtime for maintenance  -add psyllium daily  16. Screening for vitamin D deficiency: add vitamin D level to Monday morning labs  LOS: 1 days A FACE TO FACE EVALUATION WAS PERFORMED  Drema Pry Elynore Dolinski 04/09/2023, 7:20 PM

## 2023-04-10 DIAGNOSIS — Z89512 Acquired absence of left leg below knee: Secondary | ICD-10-CM | POA: Diagnosis not present

## 2023-04-10 LAB — GLUCOSE, CAPILLARY
Glucose-Capillary: 127 mg/dL — ABNORMAL HIGH (ref 70–99)
Glucose-Capillary: 106 mg/dL — ABNORMAL HIGH (ref 70–99)
Glucose-Capillary: 112 mg/dL — ABNORMAL HIGH (ref 70–99)
Glucose-Capillary: 113 mg/dL — ABNORMAL HIGH (ref 70–99)

## 2023-04-10 MED ORDER — MAGNESIUM GLUCONATE 500 MG PO TABS
250.0000 mg | ORAL_TABLET | Freq: Every day | ORAL | Status: DC
  Administered 2023-04-10 – 2023-04-12 (×3): 250 mg via ORAL
  Filled 2023-04-10 (×3): qty 1

## 2023-04-10 NOTE — Progress Notes (Signed)
PROGRESS NOTE   Subjective/Complaints: Phantom limb pain improved with Cymbalta and Gabapentin Constipated, recommended prune juice, psyllium added  ROS: phantom limb pain improved  Objective:   No results found. Recent Labs    04/08/23 0732  WBC 6.3  HGB 8.2*  HCT 25.6*  PLT 207   Recent Labs    04/08/23 0732  NA 132*  K 3.3*  CL 105  CO2 21*  GLUCOSE 144*  BUN 14  CREATININE 0.72  CALCIUM 8.2*    Intake/Output Summary (Last 24 hours) at 04/10/2023 1919 Last data filed at 04/10/2023 1853 Gross per 24 hour  Intake 717 ml  Output 1850 ml  Net -1133 ml        Physical Exam: Vital Signs Blood pressure (!) 149/75, pulse 87, temperature 98.1 F (36.7 C), temperature source Oral, resp. rate (!) 22, height 6' (1.829 m), weight 130.6 kg, SpO2 97 %. Gen: no distress, normal appearing HEENT: oral mucosa pink and moist, NCAT Cardio: Reg rate Chest: normal effort, normal rate of breathing Abdominal:     Palpations: Abdomen is soft.     Comments: Slight distention. Non-tender. Bowel sounds sl high pitched  Musculoskeletal:        General: Swelling and tenderness present. Normal range of motion.     Cervical back: Normal range of motion.  Skin:    Comments: Left BKA with wound VAC in place. Right foot with abrasion on proximal second toe d/t severe hallux valgus, foot deformity. Right heel slightly boggy  Neurological:     Mental Status: He is alert.     Comments: Alert and oriented x 3. Normal insight and awareness. Intact Memory. Normal language and speech. Cranial nerve exam unremarkable. MMT: 5/5 UE. RLE grossly 4+ to 5/5. LLE 3/5 prox, limited by pain/vac.  Stocking glove sensory loss RLE below knee, severe below sock line. DTR's trace to absent.   Psychiatric:        Mood and Affect: Mood normal.        Behavior: Behavior normal.    Assessment/Plan: 1. Functional deficits which require 3+ hours per day  of interdisciplinary therapy in a comprehensive inpatient rehab setting. Physiatrist is providing close team supervision and 24 hour management of active medical problems listed below. Physiatrist and rehab team continue to assess barriers to discharge/monitor patient progress toward functional and medical goals  Care Tool:  Bathing    Body parts bathed by patient: Right arm, Left arm, Chest, Abdomen, Right upper leg, Left upper leg, Face   Body parts bathed by helper: Right lower leg     Bathing assist Assist Level: Minimal Assistance - Patient > 75%     Upper Body Dressing/Undressing Upper body dressing   What is the patient wearing?: Pull over shirt    Upper body assist Assist Level: Set up assist    Lower Body Dressing/Undressing Lower body dressing      What is the patient wearing?: Underwear/pull up     Lower body assist Assist for lower body dressing: Moderate Assistance - Patient 50 - 74%     Toileting Toileting Toileting Activity did not occur (Clothing management and hygiene only): N/A (no void  or bm)  Toileting assist       Transfers Chair/bed transfer  Transfers assist     Chair/bed transfer assist level: Minimal Assistance - Patient > 75%     Locomotion Ambulation   Ambulation assist      Assist level: Minimal Assistance - Patient > 75% Assistive device: Walker-rolling Max distance: 15ft   Walk 10 feet activity   Assist     Assist level: Minimal Assistance - Patient > 75% Assistive device: Walker-rolling   Walk 50 feet activity   Assist    Assist level: Minimal Assistance - Patient > 75%      Walk 150 feet activity   Assist Walk 150 feet activity did not occur: Safety/medical concerns (2/2 limited endurance)         Walk 10 feet on uneven surface  activity   Assist Walk 10 feet on uneven surfaces activity did not occur: Safety/medical concerns (2/2 limited endurance)         Wheelchair     Assist Is the  patient using a wheelchair?: Yes Type of Wheelchair: Manual    Wheelchair assist level: Supervision/Verbal cueing Max wheelchair distance: 200    Wheelchair 50 feet with 2 turns activity    Assist        Assist Level: Supervision/Verbal cueing   Wheelchair 150 feet activity     Assist      Assist Level: Supervision/Verbal cueing   Blood pressure (!) 149/75, pulse 87, temperature 98.1 F (36.7 C), temperature source Oral, resp. rate (!) 22, height 6' (1.829 m), weight 130.6 kg, SpO2 97 %.  Medical Problem List and Plan: 1. Functional deficits secondary to left BKA 04/06/2023 after nonhealing left transmetatarsal amputation of 04/01/2023.  Wound VAC is indicated             -patient may not yet shower             -ELOS/Goals: 7-10 days, mod I goals with PT, OT             Initial CIR evals today   2.  Antithrombotics: -DVT/anticoagulation:  Pharmaceutical: Lovenox             -antiplatelet therapy: N/A 3. Pain Management: Oxycodone as needed             -will add low dose gabapentin 100mg  tid for phantom limb pain. Add cymbalta 20mg  daily.    4. Insomnia: increase gabapentin to 300mg     5. Neuropsych/cognition: This patient is capable of making decisions on his own behalf. 6. Skin/Wound Care: Routine skin checks 7. Fluids/Electrolytes/Nutrition: Routine in and outs with follow-up chemistries 8.  Acute on chronic anemia.  Follow-up CBC 9.  Diabetes mellitus.  Hemoglobin A1c 5.4.  SSI.  Patient not on oral hypoglycemics or insulin as outpatient 10.  Hypertension.  Norvasc 5 mg daily, Toprol-XL 25 mg daily.  Monitor with increased mobility. Magnesium gluconate 250mg  ordered HS  11.  Diastolic/systolic congestive heart failure.  Monitor for any signs of fluid overload.  Echocardiogram January 2023 with ejection fraction of 45 to 50% grade 3 diastolic dysfunction. 12.  Obesity.  BMI 37.11.  Dietary follow-up  13.  History of alcohol as well as marijuana use.  Provide  counseling. Magnesium gluconate 250mg  started HS  14.  Hyperlipidemia.  Resolved. Patient is on Lipitor. HDL reviewed and is low, other lipid panel markers are in normal rangge  15. Slow transit constipation:              -  miralax qam and senokot-s at bedtime for maintenance             -add psyllium daily  -asked nursing to bring him prune juice   16. Screening for vitamin D deficiency: add vitamin D level to Monday morning labs  17. Tachypnea: incentive spirometer ordered    LOS: 2 days A FACE TO FACE EVALUATION WAS PERFORMED  Horton Chin 04/10/2023, 7:19 PM

## 2023-04-11 ENCOUNTER — Encounter (HOSPITAL_COMMUNITY): Payer: Self-pay | Admitting: Orthopedic Surgery

## 2023-04-11 DIAGNOSIS — Z89512 Acquired absence of left leg below knee: Secondary | ICD-10-CM | POA: Diagnosis not present

## 2023-04-11 LAB — CBC WITH DIFFERENTIAL/PLATELET
Abs Immature Granulocytes: 0.02 10*3/uL (ref 0.00–0.07)
Basophils Absolute: 0 10*3/uL (ref 0.0–0.1)
Basophils Relative: 0 %
Eosinophils Absolute: 0.1 10*3/uL (ref 0.0–0.5)
Eosinophils Relative: 1 %
HCT: 25.1 % — ABNORMAL LOW (ref 39.0–52.0)
Hemoglobin: 8.1 g/dL — ABNORMAL LOW (ref 13.0–17.0)
Immature Granulocytes: 0 %
Lymphocytes Relative: 24 %
Lymphs Abs: 1.1 10*3/uL (ref 0.7–4.0)
MCH: 30.3 pg (ref 26.0–34.0)
MCHC: 32.3 g/dL (ref 30.0–36.0)
MCV: 94 fL (ref 80.0–100.0)
Monocytes Absolute: 0.4 10*3/uL (ref 0.1–1.0)
Monocytes Relative: 8 %
Neutro Abs: 3.1 10*3/uL (ref 1.7–7.7)
Neutrophils Relative %: 67 %
Platelets: 280 10*3/uL (ref 150–400)
RBC: 2.67 MIL/uL — ABNORMAL LOW (ref 4.22–5.81)
RDW: 14.6 % (ref 11.5–15.5)
WBC: 4.7 10*3/uL (ref 4.0–10.5)
nRBC: 0 % (ref 0.0–0.2)

## 2023-04-11 LAB — COMPREHENSIVE METABOLIC PANEL
ALT: 18 U/L (ref 0–44)
AST: 21 U/L (ref 15–41)
Albumin: 2.2 g/dL — ABNORMAL LOW (ref 3.5–5.0)
Alkaline Phosphatase: 129 U/L — ABNORMAL HIGH (ref 38–126)
Anion gap: 8 (ref 5–15)
BUN: 15 mg/dL (ref 8–23)
CO2: 26 mmol/L (ref 22–32)
Calcium: 8.8 mg/dL — ABNORMAL LOW (ref 8.9–10.3)
Chloride: 100 mmol/L (ref 98–111)
Creatinine, Ser: 0.65 mg/dL (ref 0.61–1.24)
GFR, Estimated: >60 mL/min (ref >60)
Glucose, Bld: 110 mg/dL — ABNORMAL HIGH (ref 70–99)
Potassium: 3.8 mmol/L (ref 3.5–5.1)
Sodium: 134 mmol/L — ABNORMAL LOW (ref 135–145)
Total Bilirubin: 0.5 mg/dL (ref 0.3–1.2)
Total Protein: 7.2 g/dL (ref 6.5–8.1)

## 2023-04-11 LAB — GLUCOSE, CAPILLARY
Glucose-Capillary: 104 mg/dL — ABNORMAL HIGH (ref 70–99)
Glucose-Capillary: 100 mg/dL — ABNORMAL HIGH (ref 70–99)
Glucose-Capillary: 127 mg/dL — ABNORMAL HIGH (ref 70–99)
Glucose-Capillary: 100 mg/dL — ABNORMAL HIGH (ref 70–99)

## 2023-04-11 LAB — VITAMIN D 25 HYDROXY (VIT D DEFICIENCY, FRACTURES): Vit D, 25-Hydroxy: 79.9 ng/mL (ref 30–100)

## 2023-04-11 MED ORDER — SORBITOL 70 % SOLN
60.0000 mL | Freq: Once | Status: AC
  Administered 2023-04-11: 60 mL via ORAL
  Filled 2023-04-11: qty 60

## 2023-04-11 NOTE — Progress Notes (Signed)
Occupational Therapy Session Note  Patient Details  Name: [name] MRN: [id number] Date of Birth: 26-Jan-1961  Today's Date: 04/11/2023 OT Individual Time: 1050-1200 OT Individual Time Calculation (min): 70 min    Short Term Goals: Week 1:  OT Short Term Goal 1 (Week 1): STGs= LTGs due to short ELOS  Skilled Therapeutic Interventions/Progress Updates:  Skilled OT intervention completed with focus on functional transfers, DC planning and DME education. Pt received upright in bed, agreeable to session. No pain reported.  Transitioned to EOB with supervision with limb guard on LLE already on. Assist provided only for wound vac management with threading of shorts, then from elevated bed, able to stand with min A using RW, and min A to donn clothing over hips with cues needed for alternating UE support on RW during dynamic balance. Min A stand hop pivot using RW to w/c. Donned shoe on R foot with set up A.   Discussed phantom pain etiology and management as pt with several questions.  Transported dependently in w/c <> ADL bathroom for time. Pt indicates he has walk in shower that he recently had renovated, however has STE and door frame and after OT demo ways to transfer in (backwards entry with RW and step hop), pt decided he wasn't comfortable with this technique. Reports having tub/shower on main floor, therefore OT demonstrated this step hop and sit pivot method on TTB. Education provided on DME available such as tub bench that can be purchased for use at home, suggested use of [facility] for ease of bathing, shower curtain management to prevent water spillage, minimizing stands via lateral leans for pericare, use of grab bars/installation as well as effective safety strategies for exiting the shower to eliminate falls.  Min A sit > stand from w/c using RW then CGA stand pivot to TTB with RW. Cues needed to control descent to TTB. Supervision to pivot into shower on TTB. Upon exiting, he had  difficulty standing without UE support on available arm rest, requiring heavy mod A to power up on L side using RW then min A stand pivot back to w/c.  Back in room, pt remained seated in w/c, with chair alarm on/activated, and with all needs in reach at end of session.   Therapy Documentation Precautions:  Precautions Precautions: Fall, Knee Precaution Comments: Reviewed BKA precautions Required Braces or Orthoses: Other Brace Other Brace: Ampu-shield Restrictions Weight Bearing Restrictions: Yes LLE Weight Bearing: Non weight bearing    Therapy/Group: Individual Therapy  [name], MS, OTR/L  04/11/2023, 12:30 PM
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
## 2023-04-11 NOTE — Progress Notes (Signed)
Inpatient Rehabilitation  Patient information reviewed and entered into eRehab system by Abelino Tippin M. Shellby Schlink, M.A., CCC/SLP, PPS Coordinator.  Information including medical coding, functional ability and quality indicators will be reviewed and updated through discharge.    

## 2023-04-11 NOTE — Progress Notes (Signed)
Physical Therapy Session Note  Patient Details  Name: [name] MRN: [id number] Date of Birth: 04/21/1961  Today's Date: 04/11/2023 PT Individual Time: [phone] and [id number] PT Individual Time Calculation (min): 73 min and 57 min  Short Term Goals: Week 1:  PT Short Term Goal 1 (Week 1): STG = LTG 2/2 LOS  Skilled Therapeutic Interventions/Progress Updates:   Treatment Session 1 Received pt sitting EOB, pt agreeable to PT treatment, and reported pain 4/10 in L residual limb - RN present to administer pain medication. Pt reported urge to have BM - therapist retrieved bariatric drop arm bedside commode, but by the time therapist returned, pt reported urge disappeared but did void in urinal without assist. Session with emphasis on functional mobility/transfers, generalized strengthening and endurance, dynamic standing balance/coordination, and gait training. Donned R shoe with min A and required x 2 attempts and min A to stand from elevated EOB with RW and performed stand<>pivot transfer bed<>WC with RW and min A. Pt sat in WC at sink and brushed teeth with set up assist.   Pt performed WC mobility 145ft using BUE and supervision - emphasis on UE strength and endurance. Stood from [facility] with RW and CGA and ambulated 3ft with RW and min A with total A to manage wound vac - pt limited by fatigue, demonstrating decreased R foot clearance and required therapist to pull WC up to sit down. Pt transferred to/from mat via stand<>pivot with RW and min A (CGA to stand) and performed the following seated exercises with emphasis on LE strength/ROM: -knee extension 2x10 bilaterally with 3lb ankle weight on RLE -hip flexion 2x10 bilaterally with 3lb ankle weight on RLE -hip abduction with blue TB 2x15 Pt transported back to room dependently and requested to return to bed. Pt transferred sit<>stand with RW and CGA and stand<>pivot WC<>bed with RW and min A. Removed shoe and transferred sit<>supine with  supervision. Concluded session with pt semi-reclined in bed, needs within reach, and bed alarm on.   Treatment Session 2 Received pt sitting in WC, pt agreeable to PT treatment, and requested pain medication prior to session - RN notified and present to administer medication during session. Session with emphasis on functional mobility/transfers, generalized strengthening and endurance, dynamic standing balance/coordination, and ambulation.  Pt performed WC mobility 186ft using BUE and supervision to dayroom - emphasis on UE strength and endurance. Pt stood from [facility] with RW and CGA and ambulated 19ft with RW and CGA/min A to fatigue. Took seated rest break, then transferred to/from mat via stand<>pivot with RW and CGA. Pt requested to work on upper body exercises and performed the following exercises with emphasis on UE/core strength: -bicep curls with 11lb dowel 2x15 -tricep extensions on yoga blocks 2x10 -lateral trunk rotations with 4lb medicine ball 2x10 bilaterally -horizontal chest press with 4lb dowel 2x10 (unable to perform overhead due to limited shoulder ROM and discomfort) Pt declined any further ambulation, feeling "worn out". Returned to room and transferred WC<>bed stand<>pivot with RW and CGA and sit<>supine with supervision. Doffed limb guard and R shoe and performed SLR 2x12 bilaterally. Discussed healing timeline and prosthetic fitting as well as home set up. Pt reports having 3 STE with bilateral handrails - plan to further discuss techniques for stair navigation tomorrow. Concluded session with pt supine in bed, needs within reach, and bed alarm on.   Therapy Documentation Precautions:  Precautions Precautions: Fall, Knee Precaution Comments: Reviewed BKA precautions Required Braces or Orthoses: Other Brace Other Brace: Ampu-shield Restrictions Weight  Bearing Restrictions: Yes LLE Weight Bearing: Non weight bearing  Therapy/Group: Individual Therapy [name] [name] PT, DPT 04/11/2023, 6:57 AM

## 2023-04-11 NOTE — Progress Notes (Signed)
PROGRESS NOTE   Subjective/Complaints: Pt reports no BM for 7 days- feels very constipated.  Pain 4/10 with pain meds Slept much better LBM last Tuesday Prune juice, dulcolax and miralax not effective.     ROS:  Pt denies SOB, abd pain, CP, N/V/C/D, and vision changes  Objective:   No results found. No results for input(s): "WBC", "HGB", "HCT", "PLT" in the last 72 hours.  Recent Labs    04/11/23 0534  NA 134*  K 3.8  CL 100  CO2 26  GLUCOSE 110*  BUN 15  CREATININE 0.65  CALCIUM 8.8*    Intake/Output Summary (Last 24 hours) at 04/11/2023 1318 Last data filed at 04/11/2023 1305 Gross per 24 hour  Intake 480 ml  Output 1725 ml  Net -1245 ml        Physical Exam: Vital Signs Blood pressure 134/72, pulse 89, temperature 97.8 F (36.6 C), resp. rate 20, height 6' (1.829 m), weight 125.4 kg, SpO2 94 %.   General: awake, alert, appropriate, sitting up in bed; NAD HENT: conjugate gaze; oropharynx moist CV: regular rate and rhythm; no JVD Pulmonary: CTA B/L; no W/R/R- good air movement GI: soft, NT,somewhat distended; hypoactive BS Psychiatric: appropriate- interactive Neurological: Ox3  Musculoskeletal: limb guard and shrinker/VAC in place      General: Swelling and tenderness present. Normal range of motion.     Cervical back: Normal range of motion.  Skin:    Comments: Left BKA with wound VAC in place. 1/2 full cannister on VAC with sanguinous drainage  Right foot with abrasion on proximal second toe d/t severe hallux valgus, foot deformity. Right heel slightly boggy  Neurological:     Mental Status: He is alert.     Comments: Alert and oriented x 3. Normal insight and awareness. Intact Memory. Normal language and speech. Cranial nerve exam unremarkable. MMT: 5/5 UE. RLE grossly 4+ to 5/5. LLE 3/5 prox, limited by pain/vac.  Stocking glove sensory loss RLE below knee, severe below sock line. DTR's trace  to absent.   Psychiatric:        Mood and Affect: Mood normal.        Behavior: Behavior normal.    Assessment/Plan: 1. Functional deficits which require 3+ hours per day of interdisciplinary therapy in a comprehensive inpatient rehab setting. Physiatrist is providing close team supervision and 24 hour management of active medical problems listed below. Physiatrist and rehab team continue to assess barriers to discharge/monitor patient progress toward functional and medical goals  Care Tool:  Bathing    Body parts bathed by patient: Right arm, Left arm, Chest, Abdomen, Right upper leg, Left upper leg, Face   Body parts bathed by helper: Right lower leg     Bathing assist Assist Level: Minimal Assistance - Patient > 75%     Upper Body Dressing/Undressing Upper body dressing   What is the patient wearing?: Pull over shirt    Upper body assist Assist Level: Set up assist    Lower Body Dressing/Undressing Lower body dressing      What is the patient wearing?: Underwear/pull up     Lower body assist Assist for lower body dressing: Moderate Assistance -  Patient 50 - 74%     Toileting Toileting Toileting Activity did not occur Press photographer and hygiene only): N/A (no void or bm)  Toileting assist Assist for toileting: Minimal Assistance - Patient > 75%     Transfers Chair/bed transfer  Transfers assist     Chair/bed transfer assist level: Minimal Assistance - Patient > 75%     Locomotion Ambulation   Ambulation assist      Assist level: Minimal Assistance - Patient > 75% Assistive device: Walker-rolling Max distance: 52ft   Walk 10 feet activity   Assist     Assist level: Minimal Assistance - Patient > 75% Assistive device: Walker-rolling   Walk 50 feet activity   Assist    Assist level: Minimal Assistance - Patient > 75%      Walk 150 feet activity   Assist Walk 150 feet activity did not occur: Safety/medical concerns (2/2  limited endurance)         Walk 10 feet on uneven surface  activity   Assist Walk 10 feet on uneven surfaces activity did not occur: Safety/medical concerns (2/2 limited endurance)         Wheelchair     Assist Is the patient using a wheelchair?: Yes Type of Wheelchair: Manual    Wheelchair assist level: Supervision/Verbal cueing Max wheelchair distance: 200    Wheelchair 50 feet with 2 turns activity    Assist        Assist Level: Supervision/Verbal cueing   Wheelchair 150 feet activity     Assist      Assist Level: Supervision/Verbal cueing   Blood pressure 134/72, pulse 89, temperature 97.8 F (36.6 C), resp. rate 20, height 6' (1.829 m), weight 125.4 kg, SpO2 94 %.  Medical Problem List and Plan: 1. Functional deficits secondary to left BKA 04/06/2023 after nonhealing left transmetatarsal amputation of 04/01/2023.  Wound VAC is indicated             -patient may not yet shower             -ELOS/Goals: 7-10 days, mod I goals with PT, OT           ccon't CIR PT and OT- IPOC today.  2.  Antithrombotics: -DVT/anticoagulation:  Pharmaceutical: Lovenox             -antiplatelet therapy: N/A 3. Pain Management: Oxycodone as needed             -will add low dose gabapentin 100mg  tid for phantom limb pain. Add cymbalta 20mg  daily.    6/24- pain is doing better- 4/10- with pain meds-  4. Insomnia: increase gabapentin to 300mg     5. Neuropsych/cognition: This patient is capable of making decisions on his own behalf. 6. Skin/Wound Care: Routine skin checks 7. Fluids/Electrolytes/Nutrition: Routine in and outs with follow-up chemistries 8.  Acute on chronic anemia.  Follow-up CBC 9.  Diabetes mellitus.  Hemoglobin A1c 5.4.  SSI.  Patient not on oral hypoglycemics or insulin as outpatient 10.  Hypertension.  Norvasc 5 mg daily, Toprol-XL 25 mg daily.  Monitor with increased mobility. Magnesium gluconate 250mg  ordered HS  11.  Diastolic/systolic congestive  heart failure.  Monitor for any signs of fluid overload.  Echocardiogram January 2023 with ejection fraction of 45 to 50% grade 3 diastolic dysfunction. 12.  Morbid Obesity. Due to having DM BMI 37.11.  Dietary follow-up  13.  History of alcohol as well as marijuana use.  Provide counseling. Magnesium gluconate 250mg  started  HS  14.  Hyperlipidemia.  Resolved. Patient is on Lipitor. HDL reviewed and is low, other lipid panel markers are in normal rangge  15. Slow transit constipation:              -miralax qam and senokot-s at bedtime for maintenance             -add psyllium daily  -asked nursing to bring him prune juice   6/24- no BM in 7 days- will give Sorbitol 60cc after therapy- if no BM, will give Soap suds enema at 7pm aftr nursing report.  16. Screening for vitamin D deficiency: add vitamin D level to Monday morning labs  17. Tachypnea: incentive spirometer ordered     I spent a total of  39  minutes on total care today- >50% coordination of care- due to IPOC, and speaking with nursing about bowels- no BM in 7 days.    LOS: 3 days A FACE TO FACE EVALUATION WAS PERFORMED  Ashelyn Mccravy 04/11/2023, 1:18 PM

## 2023-04-11 NOTE — Progress Notes (Signed)
Patient ID: [name], male   DOB: 07/19/1961, 62 y.o.   MRN: [id number] Met with the patient to review current situation, rehab process, team conference and plan of care. Discussed wound vac application/removal and dressing changes with shrinker and amputation shield. Patient expressed concerns about mark on right foot. Acknowledges phantom pain; discussion about medications, neuropathic meds and mirror therapy. Patient has 3 ste bil rails and DME.  Reviewed secondary risk management including HTN, HLD, DM (A1 C 5.4) noted on top of DM; and foot care. Endorses sleeping ok as long as pain is controlled. Constipation addressed. Continue to follow along to address educational needs to facilitate preparation for discharge. Pamelia Hoit

## 2023-04-11 NOTE — IPOC Note (Signed)
Overall Plan of Care [facility]) Patient Details Name: [name] MRN: [id number] DOB: [birth year]  Admitting Diagnosis: Left below-knee amputee [facility])  Hospital Problems: Principal Problem:   Left below-knee amputee [facility])     Functional Problem List: Nursing Safety, Pain, Bowel, Endurance, Medication Management, Nutrition, Skin Integrity  PT Balance, Endurance, Edema, Motor, Pain, Safety, Skin Integrity  OT Balance, Pain, Cognition, Safety, Edema, Sensory, Endurance, Motor, Nutrition, Skin Integrity  SLP    TR         Basic ADL's: OT Bathing, Grooming, Dressing, Toileting     Advanced  ADL's: OT       Transfers: PT Bed Mobility, Bed to Chair, Car  OT Toilet     Locomotion: PT Ambulation, Stairs, Wheelchair Mobility     Additional Impairments: OT None  SLP        TR      Anticipated Outcomes Item Anticipated Outcome  Self Feeding N/A  Swallowing      Basic self-care  Mod I  Toileting  Mod I   Bathroom Transfers Supervision  Bowel/Bladder  manage bowel w mod I assist  Transfers  Mod I  Locomotion  Mod I with WC; S + RW for amb; S for stair navigation  Communication     Cognition     Pain  < 4 with prns  Safety/Judgment  manage w cues   Therapy Plan: PT Intensity: Minimum of 1-2 x/day ,45 to 90 minutes PT Frequency: 5 out of 7 days PT Duration Estimated Length of Stay: 7-10 days OT Intensity: Minimum of 1-2 x/day, 45 to 90 minutes OT Frequency: 5 out of 7 days OT Duration/Estimated Length of Stay: 7-10 days     Team Interventions: Nursing Interventions Patient/Family Education, Pain Management, Bowel Management, Skin Care/Wound Management, Medication Management, Discharge Planning, Disease Management/Prevention  PT interventions Ambulation/gait training, Balance/vestibular training, Discharge planning, Disease management/prevention, DME/adaptive equipment instruction, Functional mobility training, Patient/family education, Pain management,  Psychosocial support, Therapeutic Exercise, Therapeutic Activities, Stair training, UE/LE Strength taining/ROM, UE/LE Coordination activities, Wheelchair propulsion/positioning  OT Interventions Warden/ranger, Community reintegration, Disease mangement/prevention, Equities trader education, Splinting/orthotics, Self Care/advanced ADL retraining, Therapeutic Exercise, UE/LE Coordination activities, Wheelchair propulsion/positioning, Therapeutic Activities, Pain management, Cognitive remediation/compensation, Discharge planning, DME/adaptive equipment instruction, Functional mobility training, Psychosocial support, Skin care/wound managment, UE/LE Strength taining/ROM  SLP Interventions    TR Interventions    SW/CM Interventions Discharge Planning, Psychosocial Support, Patient/Family Education, Disease Management/Prevention   Barriers to Discharge MD  Medical stability, Home enviroment access/loayout, Wound care, Lack of/limited family support, Weight, and Weight bearing restrictions  Nursing Decreased caregiver support, Home environment access/layout 2 level main B+B, 3 ste bil rails w S.O.; has RW,rollator, SS, 3N1, W/C and SPC  PT Home environment access/layout, Weight bearing restrictions has 3STE  OT      SLP      SW Decreased caregiver support, Lack of/limited family support, Inaccessible home environment     Team Discharge Planning: Destination: PT-Home ,OT- Home , SLP-  Projected Follow-up: PT-Home health PT, OT-  Home health OT, SLP-  Projected Equipment Needs: PT-To be determined, OT- To be determined, SLP-  Equipment Details: PT-has RW and WC, OT-  Patient/family involved in discharge planning: PT- Patient,  OT-Patient, SLP-   MD ELOS: 7-10 days Medical Rehab Prognosis:  Good Assessment: The patient has been admitted for CIR therapies with the diagnosis of L BKA. The team will be addressing functional mobility, strength, stamina, balance, safety, adaptive techniques  and equipment, self-care, bowel and bladder mgt,  patient and caregiver education, preprosthetic training . Goals have been set at mod I to supervision. Anticipated discharge destination is home.        See Team Conference Notes for weekly updates to the plan of care

## 2023-04-11 NOTE — Progress Notes (Signed)
Inpatient Rehabilitation Center Individual Statement of Services  Patient Name:  [name]  Date:  04/11/2023  Welcome to the Inpatient Rehabilitation Center.  Our goal is to provide you with an individualized program based on your diagnosis and situation, designed to meet your specific needs.  With this comprehensive rehabilitation program, you will be expected to participate in at least 3 hours of rehabilitation therapies Monday-Friday, with modified therapy programming on the weekends.  Your rehabilitation program will include the following services:  Physical Therapy (PT), Occupational Therapy (OT), Speech Therapy (ST), 24 hour per day rehabilitation nursing, Therapeutic Recreaction (TR), Neuropsychology, Care Coordinator, Rehabilitation Medicine, Nutrition Services, Pharmacy Services, and Other  Weekly team conferences will be held on Wednesdays to discuss your progress.  Your Inpatient Rehabilitation Care Coordinator will talk with you frequently to get your input and to update you on team discussions.  Team conferences with you and your family in attendance may also be held.  Expected length of stay: 7-10 Days Overall anticipated outcome:  MOD I at Wheelchair Level  Depending on your progress and recovery, your program may change. Your Inpatient Rehabilitation Care Coordinator will coordinate services and will keep you informed of any changes. Your Inpatient Rehabilitation Care Coordinator's name and contact numbers are listed  below.  The following services may also be recommended but are not provided by the Inpatient Rehabilitation Center:   Home Health Rehabiltiation Services Outpatient Rehabilitation Services    Arrangements will be made to provide these services after discharge if needed.  Arrangements include referral to agencies that provide these services.  Your insurance has been verified to be:   Medicare A & B Your primary doctor is:  [name], MD  Pertinent  information will be shared with your doctor and your insurance company.  Inpatient Rehabilitation Care Coordinator:  Lavera Guise, [place] [phone] or [phone]  Information discussed with and copy given to patient by: Andria Rhein, 04/11/2023, 10:31 AM

## 2023-04-12 DIAGNOSIS — Z89512 Acquired absence of left leg below knee: Secondary | ICD-10-CM | POA: Diagnosis not present

## 2023-04-12 LAB — GLUCOSE, CAPILLARY
Glucose-Capillary: 119 mg/dL — ABNORMAL HIGH (ref 70–99)
Glucose-Capillary: 93 mg/dL (ref 70–99)
Glucose-Capillary: 118 mg/dL — ABNORMAL HIGH (ref 70–99)
Glucose-Capillary: 117 mg/dL — ABNORMAL HIGH (ref 70–99)

## 2023-04-12 NOTE — Progress Notes (Signed)
PROGRESS NOTE   Subjective/Complaints: Had BM yesterday Asks when wound vac can be removed    ROS:  Pt denies SOB, abd pain, CP, N/V/C/D, and vision changes  Objective:   No results found. Recent Labs    04/11/23 0640  WBC 4.7  HGB 8.1*  HCT 25.1*  PLT 280    Recent Labs    04/11/23 0534  NA 134*  K 3.8  CL 100  CO2 26  GLUCOSE 110*  BUN 15  CREATININE 0.65  CALCIUM 8.8*    Intake/Output Summary (Last 24 hours) at 04/12/2023 1145 Last data filed at 04/12/2023 0844 Gross per 24 hour  Intake 716 ml  Output 1300 ml  Net -584 ml        Physical Exam: Vital Signs Blood pressure 127/71, pulse 85, temperature 98.1 F (36.7 C), temperature source Oral, resp. rate 18, height 6' (1.829 m), weight 126 kg, SpO2 96 %.   General: awake, alert, appropriate, sitting up in bed; NAD HENT: conjugate gaze; oropharynx moist CV: regular rate and rhythm; no JVD Pulmonary: CTA B/L; no W/R/R- good air movement GI: soft, NT,somewhat distended; hypoactive BS Psychiatric: appropriate- interactive Neurological: Ox3  Musculoskeletal: limb guard and shrinker/VAC in place      General: Swelling and tenderness present. Normal range of motion.     Cervical back: Normal range of motion.  Skin:    Comments: Left BKA with wound VAC in place. 1/2 full cannister on VAC with sanguinous drainage  Right foot with abrasion on proximal second toe d/t severe hallux valgus, foot deformity. Right heel slightly boggy  Neurological:     Mental Status: He is alert.     Comments: Alert and oriented x 3. Normal insight and awareness. Intact Memory. Normal language and speech. Cranial nerve exam unremarkable. MMT: 5/5 UE. RLE grossly 4+ to 5/5. LLE 3/5 prox, limited by pain/vac.  Stocking glove sensory loss RLE below knee, severe below sock line. DTR's trace to absent.   Psychiatric:        Mood and Affect: Mood normal.        Behavior:  Behavior normal.    Assessment/Plan: 1. Functional deficits which require 3+ hours per day of interdisciplinary therapy in a comprehensive inpatient rehab setting. Physiatrist is providing close team supervision and 24 hour management of active medical problems listed below. Physiatrist and rehab team continue to assess barriers to discharge/monitor patient progress toward functional and medical goals  Care Tool:  Bathing    Body parts bathed by patient: Right arm, Left arm, Chest, Abdomen, Right upper leg, Left upper leg, Face   Body parts bathed by helper: Right lower leg     Bathing assist Assist Level: Minimal Assistance - Patient > 75%     Upper Body Dressing/Undressing Upper body dressing   What is the patient wearing?: Pull over shirt    Upper body assist Assist Level: Set up assist    Lower Body Dressing/Undressing Lower body dressing      What is the patient wearing?: Underwear/pull up     Lower body assist Assist for lower body dressing: Moderate Assistance - Patient 50 - 74%  Toileting Toileting Toileting Activity did not occur Press photographer and hygiene only): N/A (no void or bm)  Toileting assist Assist for toileting: Minimal Assistance - Patient > 75%     Transfers Chair/bed transfer  Transfers assist     Chair/bed transfer assist level: Minimal Assistance - Patient > 75%     Locomotion Ambulation   Ambulation assist      Assist level: Minimal Assistance - Patient > 75% Assistive device: Walker-rolling Max distance: 55ft   Walk 10 feet activity   Assist     Assist level: Minimal Assistance - Patient > 75% Assistive device: Walker-rolling   Walk 50 feet activity   Assist    Assist level: Minimal Assistance - Patient > 75%      Walk 150 feet activity   Assist Walk 150 feet activity did not occur: Safety/medical concerns (2/2 limited endurance)         Walk 10 feet on uneven surface  activity   Assist  Walk 10 feet on uneven surfaces activity did not occur: Safety/medical concerns (2/2 limited endurance)         Wheelchair     Assist Is the patient using a wheelchair?: Yes Type of Wheelchair: Manual    Wheelchair assist level: Supervision/Verbal cueing Max wheelchair distance: 200    Wheelchair 50 feet with 2 turns activity    Assist        Assist Level: Supervision/Verbal cueing   Wheelchair 150 feet activity     Assist      Assist Level: Supervision/Verbal cueing   Blood pressure 127/71, pulse 85, temperature 98.1 F (36.7 C), temperature source Oral, resp. rate 18, height 6' (1.829 m), weight 126 kg, SpO2 96 %.  Medical Problem List and Plan: 1. Functional deficits secondary to left BKA 04/06/2023 after nonhealing left transmetatarsal amputation of 04/01/2023.  Wound VAC is indicated, will touch base with surgery when this can be removed             -patient may not yet shower             -ELOS/Goals: 7-10 days, mod I goals with PT, OT           Continue CIR PT and OT  2.  Impaired mobility: continue Lovenox             -antiplatelet therapy: N/A  3. Pain Management: Oxycodone as needed             -will add low dose gabapentin 100mg  tid for phantom limb pain. Add cymbalta 20mg  daily. Asked Tobi Bastos PT to perform mirror therapy    4. Insomnia: increase gabapentin to 300mg , improved, continue   5. Neuropsych/cognition: This patient is capable of making decisions on his own behalf. 6. Skin/Wound Care: Routine skin checks 7. Fluids/Electrolytes/Nutrition: Routine in and outs with follow-up chemistries 8.  Acute on chronic anemia.  Follow-up CBC 9.  Diabetes mellitus.  Hemoglobin A1c 5.4.  SSI.  Patient not on oral hypoglycemics or insulin as outpatient 10.  Hypertension.  Norvasc 5 mg daily, Toprol-XL 25 mg daily.  Monitor with increased mobility. Magnesium gluconate 250mg  ordered HS  11.  Diastolic/systolic congestive heart failure.  Monitor for any signs  of fluid overload.  Echocardiogram January 2023 with ejection fraction of 45 to 50% grade 3 diastolic dysfunction. 12.  Morbid Obesity. Due to having DM BMI 37.11.  Dietary follow-up  13.  History of alcohol as well as marijuana use.  Provide counseling. Magnesium gluconate 250mg   started HS  14.  Hyperlipidemia.  Resolved. Patient is on Lipitor. HDL reviewed and is low, other lipid panel markers are in normal rangge  15. Slow transit constipation:              -miralax qam and senokot-s at bedtime for maintenance             -add psyllium daily  -asked nursing to bring him prune juice   6/24- no BM in 7 days- will give Sorbitol 60cc after therapy- if no BM, will give Soap suds enema at 7pm aftr nursing report.  16. Screening for vitamin D deficiency: add vitamin D level to Monday morning labs  17. Tachypnea: incentive spirometer ordered     LOS: 4 days A FACE TO FACE EVALUATION WAS PERFORMED  Clint Bolder P Annibelle Brazie 04/12/2023, 11:45 AM

## 2023-04-12 NOTE — Progress Notes (Signed)
Physical Therapy Session Note  Patient Details  Name: [name] MRN: [id number] Date of Birth: 10-13-1961  Today's Date: 04/12/2023 PT Individual Time: [phone] and [phone] PT Individual Time Calculation (min): 79 min and 58 min  Short Term Goals: Week 1:  PT Short Term Goal 1 (Week 1): STG = LTG 2/2 LOS  Skilled Therapeutic Interventions/Progress Updates:   Treatment Session 1 Received pt semi-reclined in bed, pt agreeable to PT treatment, and reported pain 4/10 in L residual limb (premedicated). Session with emphasis on functional mobility/transfers, generalized strengthening and endurance, dynamic standing balance/coordination, and stair navigation. Donned limb guard with supervision and transferred semi-reclined<>sitting EOB with HOB elevated and supervision. Donned R shoe with max A and transferred bed<>WC stand<>pivot from elevated EOB with RW and CGA.    Pt performed WC mobility 155ft using BUE and supervision to main therapy gym - emphasis on UE strength. RN arrived to administer medications and discussed stair navigation techniques as pt reports having 3 STE with 2 handrails. Demonstrated technique for hopping up steps forwards, backwards, and bumping up on bottom but ultimately recommended getting ramp installed. Attempted to lcoate bariatric shower chair to demonstrate shower chair technique, but unable to locate one. Stood from [facility] with RW and CGA and worked on standing hops on R leg x 10 reps with CGA for balance with emphasis on foot clearance in preparation for attempting to hop up 3in step, however pt only able to clear R foot ~1in on 2/10 hops. Pt initially resistive to recommendation for ramp but after realizing difficulty of hopping in place, pt recognized that a ramp is the safest option and in agreement with therapist - provided ramp resources  In ortho gym, pt performed WC mobility up/down ramp 2x14ft ascending backwards using BUE and RLE and descending forwards with  supervision and x90ft ascending and descending forwards using BUE support and min A to overcome initial threshold. Pt declined working on ambulation and requested to work on UE strength - worked on [employer] for 4 minute at level 4 alternating 1 minute forwards and 1 minute backwards. Pt transported back to room in [facility] dependently and transferred WC<>bed stand<>pivot with RW and CGA and sit<>supine with supervision. Concluded session with pt semi-reclined in bed, needs within reach, and bed alarm on.    Treatment Session 2 Received pt semi-reclined in bed, pt agreeable to PT treatment, and denied any pain at start of session (premedicated). Session with emphasis on functional mobility/transfers, generalized strengthening and endurance, and gait training. Pt performed the following warm up exercises with emphasis on LE strength/ROM and contracture prevention: -supine hip abduction 2x10 bilaterally -supine hip adduction pillow squeezes 2x10 with 5 second isometric hold -supine single leg bridge 2x5 -R sidelying L hip abduction 2x10 -R sidelying L hip extension 2x10 Pt transferred R sidelying<>sitting EOB with HOB elevated and use of bedrails with supervision. Donned R shoe with max A and limb guard with min A. Stood from elevated EOB with RW and CGA and ambulated 26ft with RW and CGA with total A for equipment management - limited by fatigue. Pt then performed WC mobility 125ft x 2 trials using BUE and supervision to/from dayroom with emphasis on UE strength. Pt performed seated RLE strengthening on Kinetron at 20 cm/sec for x50 reps and at 10 cm/sec for x25 reps with therapist providing manual counter resistance with emphasis on glute/quad strength. Returned to room and pt requested to return to bed and transferred WC<>bed stand<>pivot with RW and CGA and sit<>supine with  supervision. Concluded session with pt semi-reclined in bed, needs within reach, and bed alarm on.   Therapy Documentation Precautions:   Precautions Precautions: Fall, Knee Precaution Comments: Reviewed BKA precautions Required Braces or Orthoses: Other Brace Other Brace: Ampu-shield Restrictions Weight Bearing Restrictions: Yes LLE Weight Bearing: Non weight bearing  Therapy/Group: Individual Therapy [name] [name] PT, DPT 04/12/2023, 7:12 AM

## 2023-04-12 NOTE — Progress Notes (Signed)
Occupational Therapy Session Note  Patient Details  Name: [name] MRN: [id number] Date of Birth: 11/20/1960  Today's Date: 04/12/2023 OT Individual Time: 1020-1130 OT Individual Time Calculation (min): 70 min    Short Term Goals: Week 1:  OT Short Term Goal 1 (Week 1): STGs= LTGs due to short ELOS  Skilled Therapeutic Interventions/Progress Updates:  Skilled OT intervention completed with focus on w/c mobility, dynamic balance with unilateral UE support, sit > stands from low surfaces. Pt received upright in bed, agreeable to session. No pain reported.  Pt declined self-care needs. Wound vac noted to be paused, therefore OT reset negative pressure therapy. Donned limb guard with set up A and R shoe. Transitioned to EOB with mod I. From elevated bed, stood with min A using RW then CGA stand pivot to w/c. Pt then self-propelled in w/c with supervision with short stride length due to lack of elbow extension due to low w/c height (pt states he received w/c through insurance < 5 yrs ago but will confirm details).  Min A sit > stand from w/c using BUE on arm rest to RW however compensatory method of using R knee on w/c noted and stabilization of w/c needed for safety. CGA stand pivot with RW to EOM.  Attempted to stand from mat at lowest position (21") however unable to power up due to low surface and absence of arm rests to push up on. OT heightened mat to 22.5" and provided cues for using L hand on RW and R hand on mat, and with mod A, was able to power up into standing. Pt then participated in dynamic reaching/balance task by placing 8 horse shoe on top of mirror. Required up to mod A for standing balance with L hand unsupported, min A for R hand and noted compensatory knee method on mat. On 2nd trial, had scoot forward more, with heavy min A to power up into standing with RW, however improved to CGA balance with R hand and light min A for L hand during task with better posture. CGA stand pivot  to w/c.   Transported dependently in w/c back to room. Completed min A sit > stand and CGA stand pivot using RW to EOB. Doffed limb guard, shoe independently. Transitioned to upright in bed with mod I. Education needed about proper elevation with contracture prevention method vs knee under pillow for L residual limb.  Pt remained upright in bed, with bed alarm on/activated, and with all needs in reach at end of session.   Therapy Documentation Precautions:  Precautions Precautions: Fall, Knee Precaution Comments: Reviewed BKA precautions Required Braces or Orthoses: Other Brace Other Brace: Ampu-shield Restrictions Weight Bearing Restrictions: Yes LLE Weight Bearing: Non weight bearing    Therapy/Group: Individual Therapy  [name], MS, OTR/L  04/12/2023, 12:09 PM

## 2023-04-12 NOTE — Plan of Care (Signed)
  Problem: Consults Goal: RH LIMB LOSS PATIENT EDUCATION Description: Description: See Patient Education module for eduction specifics. Outcome: Progressing   Problem: RH BOWEL ELIMINATION Goal: RH STG MANAGE BOWEL WITH ASSISTANCE Description: STG Manage Bowel with toileting Assistance. Outcome: Progressing Goal: RH STG MANAGE BOWEL W/MEDICATION W/ASSISTANCE Description: STG Manage Bowel with Medication with modI  Assistance. Outcome: Progressing   Problem: RH SKIN INTEGRITY Goal: RH STG SKIN FREE OF INFECTION/BREAKDOWN Description: Manage w min assist Outcome: Progressing Goal: RH STG ABLE TO PERFORM INCISION/WOUND CARE W/ASSISTANCE Description: STG Able To Perform Incision/Wound Care With min Assistance. Outcome: Progressing   Problem: RH PAIN MANAGEMENT Goal: RH STG PAIN MANAGED AT OR BELOW PT'S PAIN GOAL Description: < 4 with prns Outcome: Progressing   Problem: RH KNOWLEDGE DEFICIT LIMB LOSS Goal: RH STG INCREASE KNOWLEDGE OF SELF CARE AFTER LIMB LOSS Description: Manage self care with medications, and dietary modifications, using educational resources independently Outcome: Progressing   Problem: Education: Goal: Knowledge of the prescribed therapeutic regimen will improve Outcome: Progressing Goal: Ability to verbalize activity precautions or restrictions will improve Outcome: Progressing Goal: Understanding of discharge needs will improve Outcome: Progressing   Problem: Activity: Goal: Ability to perform//tolerate increased activity and mobilize with assistive devices will improve Outcome: Progressing   Problem: Clinical Measurements: Goal: Postoperative complications will be avoided or minimized Outcome: Progressing   Problem: Self-Care: Goal: Ability to meet self-care needs will improve Outcome: Progressing   Problem: Self-Concept: Goal: Ability to maintain and perform role responsibilities to the fullest extent possible will improve Outcome:  Progressing   Problem: Pain Management: Goal: Pain level will decrease with appropriate interventions Outcome: Progressing   Problem: Skin Integrity: Goal: Demonstration of wound healing without infection will improve Outcome: Progressing

## 2023-04-12 NOTE — Progress Notes (Signed)
Wound vac continues to have bloody drainage.   Tilden Dome, LPN

## 2023-04-13 DIAGNOSIS — Z89512 Acquired absence of left leg below knee: Secondary | ICD-10-CM | POA: Diagnosis not present

## 2023-04-13 LAB — GLUCOSE, CAPILLARY
Glucose-Capillary: 117 mg/dL — ABNORMAL HIGH (ref 70–99)
Glucose-Capillary: 109 mg/dL — ABNORMAL HIGH (ref 70–99)
Glucose-Capillary: 105 mg/dL — ABNORMAL HIGH (ref 70–99)
Glucose-Capillary: 90 mg/dL (ref 70–99)

## 2023-04-13 MED ORDER — MAGNESIUM GLUCONATE 500 MG PO TABS
500.0000 mg | ORAL_TABLET | Freq: Every day | ORAL | Status: DC
  Administered 2023-04-13 – 2023-04-20 (×8): 500 mg via ORAL
  Filled 2023-04-13 (×8): qty 1

## 2023-04-13 NOTE — Progress Notes (Signed)
Occupational Therapy Session Note  Patient Details  Name: [name] MRN: [id number] Date of Birth: 1961/07/04  Session 1: Today's Date: 04/14/2023 OT Individual Time: 1001-1051 OT Individual Time Calculation (min): 50 min  Session 2: Today's Date: 04/14/2023 OT Individual Time: 1401-1430 OT Individual Time Calculation (min): 29 min   Short Term Goals: Week 1:  OT Short Term Goal 1 (Week 1): STGs= LTGs due to short ELOS  Skilled Therapeutic Interventions/Progress Updates:    Session 1: Patient agreeable to participate in OT session. Reports 0/10 pain level.   Patient participated in skilled OT session focusing on functional mobility utilizing RW and ADL re-training.  Pr donned left limb guard with set-up. Educated pt that he has an order for TEDs on his right LE and pt required Min A to fully don TED. Educated pt on modified technique of using pillowcase to assist although required assist to manage heel portion of TED hose.  Pt completed functional transfer from bed to [facility] with Min guard assist and RW. Therapist provided assist to manage wound vac. While seated pt completed grooming at sink with set-up.  - Pt completed functional mobility simulating navigating through narrow bathroom doorway while using RW. Performed standing lateral hops through narrow opening. Discussed bathroom set-up while attempting to recreate environment in order to simulate  task. Provided CGA for balance and safety. VC provided on technique and sequencing as needed. Required 1 seated rest break in between 2 trials.  - WC armrests reversed to allow pt to use higher portion to allow him to complete sit<>stand transitions with less difficulty.   Session 2: Patient agreeable to participate in OT session. Reports 0/10 pain level.   Patient participated in skilled OT session focusing on BUE strengthening. Therapist educated pt on proper form and technique while providing visual demonstration in order to improve BUE  strength required to complete functional transfers and navigate into bathroom while using RW.  - Seated, Red band, LUE, shoulder horizontal abduction, adduction, flexion, 2 sets, 12X - Seated, Blue band, BUE, elbow extension, 2 sets, 12X  Therapy Documentation Precautions:  Precautions Precautions: Fall, Knee Precaution Comments: Reviewed BKA precautions Required Braces or Orthoses: Other Brace Other Brace: Ampu-shield Restrictions Weight Bearing Restrictions: Yes LLE Weight Bearing: Non weight bearing    Therapy/Group: Individual Therapy  Limmie Patricia, OTR/L,CBIS  Supplemental OT - [facility] and [facility] Secure Chat Preferred   04/14/2023, 6:43 AM

## 2023-04-13 NOTE — Progress Notes (Signed)
Occupational Therapy Session Note  Patient Details  Name: [name] MRN: [id number] Date of Birth: March 23, 1961  Today's Date: 04/13/2023 OT Individual Time: 2952-8413 OT Individual Time Calculation (min): 70 min    Short Term Goals: Week 1:  OT Short Term Goal 1 (Week 1): STGs= LTGs due to short ELOS  Skilled Therapeutic Interventions/Progress Updates:  Skilled OT intervention completed with focus on DC planning, DME education and toilet transfer training. Pt received upright in bed, agreeable to session. Intermittent pain reported in L residual; pre-medicated. OT offered rest breaks and repositioning throughout for pain reduction.  Discussed and addressed the following topics during session: -Ramp need for accessibility into home and specifications of ramp length per footage requirement vs very steep ramp to "make it fit" and "avoid the grass" -Wound vac removal at later time despite it being day 7 due continued fluid collection in cannister per MD -Pt was tearful about DC date and concerns with safety/independence; OT offered emotional support and encouragement -Reviewed DME needs including TTB if planning to shower, and pt preferred wide BSC for leaning ability and efficiency. Discussed neither are covered by insurance and reviewed options online for OOP purchase in prep for DC -Discussed BSC liners with absorbable pad for liquid waste for independence with clean up if using BSC at bedside  Encouraged pt to trial standard BSC transfer/simulated toileting as he currently owns this device. Transitioned to EOB with mod I; limb guard already on. CGA sit > stand from elevated bed using RW, then CGA stand pivot to [facility], with cues needed to control his descent but CGA only. Hips did fit the width, but pt apprehensive about leaning ability and sturdiness of BSC therefore continued to op for wide BSC. Min A sit > stand using RW with stabilization of BSC needed to prevent tipping with R knee pushing  on front of it. CGA stand pivot to EOB.   Seated EOB, pt completed the following BUE exercises to address strength needed for BADLs: (With yellow theraband) 12 reps Horizontal abduction Self-anchored bicep flexion each arm -RUE limited due to RC tear  Mod I transition back into bed, then pt remained upright in bed, with bed alarm on/activated, and with all needs in reach at end of session.   Therapy Documentation Precautions:  Precautions Precautions: Fall, Knee Precaution Comments: Reviewed BKA precautions Required Braces or Orthoses: Other Brace Other Brace: Ampu-shield Restrictions Weight Bearing Restrictions: Yes LLE Weight Bearing: Non weight bearing    Therapy/Group: Individual Therapy  [name], MS, OTR/L  04/13/2023, 3:59 PM

## 2023-04-13 NOTE — Progress Notes (Signed)
Patient ID: [name], male   DOB: 1961-06-19, 62 y.o.   MRN: [id number]  Team Conference Report to Patient/Family  Team Conference discussion was reviewed with the patient and caregiver, including goals, any changes in plan of care and target discharge date.  Patient and caregiver express understanding and are in agreement.  The patient has a target discharge date of 04/19/23.   SW met with patient and provided team conference updates. Patient wound vac will remain until draining has completely stopped. Patient still looking into different ramp agencies. Patient plans to have S.O arrange to assist with showers. No additional questions or concerns.  Andria Rhein 04/13/2023, 1:52 PM

## 2023-04-13 NOTE — Progress Notes (Signed)
PROGRESS NOTE   Subjective/Complaints: Wound vac is still draining  Will be independent at home Pain remains well controlled, was not sure whether mirror therapy helped  ROS:  Pt denies SOB, abd pain, CP, N/V/C/D, and vision changes, phantom pain is still present but better controlled  Objective:   No results found. Recent Labs    04/11/23 0640  WBC 4.7  HGB 8.1*  HCT 25.1*  PLT 280    Recent Labs    04/11/23 0534  NA 134*  K 3.8  CL 100  CO2 26  GLUCOSE 110*  BUN 15  CREATININE 0.65  CALCIUM 8.8*    Intake/Output Summary (Last 24 hours) at 04/13/2023 1140 Last data filed at 04/12/2023 2112 Gross per 24 hour  Intake 240 ml  Output 800 ml  Net -560 ml        Physical Exam: Vital Signs Blood pressure 131/87, pulse 81, temperature 98.9 F (37.2 C), temperature source Oral, resp. rate 17, height 6' (1.829 m), weight 131.7 kg, SpO2 95 %. General: awake, alert, appropriate, sitting up in bed; NAD, BMI 39.38 HENT: conjugate gaze; oropharynx moist CV: regular rate and rhythm; no JVD Pulmonary: CTA B/L; no W/R/R- good air movement GI: soft, NT,somewhat distended; hypoactive BS Psychiatric: appropriate- interactive Neurological: Ox3 Extremities: wound vac still draining Musculoskeletal: limb guard and shrinker/VAC in place      General: Swelling and tenderness present. Normal range of motion.     Cervical back: Normal range of motion.  Skin:    Comments: Left BKA with wound VAC in place. 1/2 full cannister on VAC with sanguinous drainage  Right foot with abrasion on proximal second toe d/t severe hallux valgus, foot deformity. Right heel slightly boggy  Neurological:     Mental Status: He is alert.     Comments: Alert and oriented x 3. Normal insight and awareness. Intact Memory. Normal language and speech. Cranial nerve exam unremarkable. MMT: 5/5 UE. RLE grossly 4+ to 5/5. LLE 3/5 prox, limited by  pain/vac.  Stocking glove sensory loss RLE below knee, severe below sock line. DTR's trace to absent.   Psychiatric:        Mood and Affect: Mood normal.        Behavior: Behavior normal.    Assessment/Plan: 1. Functional deficits which require 3+ hours per day of interdisciplinary therapy in a comprehensive inpatient rehab setting. Physiatrist is providing close team supervision and 24 hour management of active medical problems listed below. Physiatrist and rehab team continue to assess barriers to discharge/monitor patient progress toward functional and medical goals  Care Tool:  Bathing    Body parts bathed by patient: Right arm, Left arm, Chest, Abdomen, Right upper leg, Left upper leg, Face   Body parts bathed by helper: Right lower leg     Bathing assist Assist Level: Minimal Assistance - Patient > 75%     Upper Body Dressing/Undressing Upper body dressing   What is the patient wearing?: Pull over shirt    Upper body assist Assist Level: Set up assist    Lower Body Dressing/Undressing Lower body dressing      What is the patient wearing?: Underwear/pull up  Lower body assist Assist for lower body dressing: Moderate Assistance - Patient 50 - 74%     Toileting Toileting Toileting Activity did not occur Press photographer and hygiene only): N/A (no void or bm)  Toileting assist Assist for toileting: Minimal Assistance - Patient > 75%     Transfers Chair/bed transfer  Transfers assist     Chair/bed transfer assist level: Minimal Assistance - Patient > 75%     Locomotion Ambulation   Ambulation assist      Assist level: Minimal Assistance - Patient > 75% Assistive device: Walker-rolling Max distance: 79ft   Walk 10 feet activity   Assist     Assist level: Minimal Assistance - Patient > 75% Assistive device: Walker-rolling   Walk 50 feet activity   Assist    Assist level: Minimal Assistance - Patient > 75%      Walk 150 feet  activity   Assist Walk 150 feet activity did not occur: Safety/medical concerns (2/2 limited endurance)         Walk 10 feet on uneven surface  activity   Assist Walk 10 feet on uneven surfaces activity did not occur: Safety/medical concerns (2/2 limited endurance)         Wheelchair     Assist Is the patient using a wheelchair?: Yes Type of Wheelchair: Manual    Wheelchair assist level: Supervision/Verbal cueing Max wheelchair distance: 200    Wheelchair 50 feet with 2 turns activity    Assist        Assist Level: Supervision/Verbal cueing   Wheelchair 150 feet activity     Assist      Assist Level: Supervision/Verbal cueing   Blood pressure 131/87, pulse 81, temperature 98.9 F (37.2 C), temperature source Oral, resp. rate 17, height 6' (1.829 m), weight 131.7 kg, SpO2 95 %.  Medical Problem List and Plan: 1. Functional deficits secondary to left BKA 04/06/2023 after nonhealing left transmetatarsal amputation of 04/01/2023.  Wound VAC is indicated, will touch base with surgery when this can be removed             -patient may not yet shower             -ELOS/Goals: 7-10 days, mod I goals with PT, OT           Continue CIR PT and OT  2.  Impaired mobility: continue Lovenox             -antiplatelet therapy: N/A  3. Pain Management: Oxycodone as needed             -will add low dose gabapentin 100mg  tid for phantom limb pain. Add cymbalta 20mg  daily. Asked Tobi Bastos PT to perform mirror therapy    4. Insomnia: increase gabapentin to 300mg , improved, continue   5. Neuropsych/cognition: This patient is capable of making decisions on his own behalf. 6. Skin/Wound Care: Routine skin checks 7. Fluids/Electrolytes/Nutrition: Routine in and outs with follow-up chemistries 8.  Acute on chronic anemia.  Follow-up CBC 9.  Diabetes mellitus.  Hemoglobin A1c 5.4.  SSI.  Patient not on oral hypoglycemics or insulin as outpatient 10.  Hypertension.  Norvasc 5 mg  daily, Toprol-XL 25 mg daily.  Monitor with increased mobility. Increase Magnesium gluconate to 500mg  HS  11.  Diastolic/systolic congestive heart failure.  Monitor for any signs of fluid overload.  Echocardiogram January 2023 with ejection fraction of 45 to 50% grade 3 diastolic dysfunction. 12.  Morbid Obesity. Due to having DM BMI 37.11.  Dietary follow-up  13.  History of alcohol as well as marijuana use.  Provide counseling. Increase magnesium gluconate to 500mg  HS  14.  Hyperlipidemia.  Resolved. Patient is on Lipitor. HDL reviewed and is low, other lipid panel markers are in normal rangge  15. Slow transit constipation:              -miralax qam and senokot-s at bedtime for maintenance             -add psyllium daily  -asked nursing to bring him prune juice   Messaged nursing to see if he had a BM today  16. Screening for vitamin D deficiency: vitamin D reviewed and is normal  17. Tachypnea: incentive spirometer ordered     LOS: 5 days A FACE TO FACE EVALUATION WAS PERFORMED  Clint Bolder P Billy Rocco 04/13/2023, 11:40 AM

## 2023-04-13 NOTE — Progress Notes (Signed)
Physical Therapy Session Note  Patient Details  Name: [name] MRN: [id number] Date of Birth: 02/05/1961  Today's Date: 04/13/2023 PT Individual Time: 4098-1191 PT Individual Time Calculation (min): 90 min   Short Term Goals: Week 1:  PT Short Term Goal 1 (Week 1): STG = LTG 2/2 LOS  Skilled Therapeutic Interventions/Progress Updates:   Pt found in bed asleep at beginning of session. Agreeable to session. 3-4/10 pain reported; nursing made aware and issued medication.   Therex: R heel slide x 15, L knee flexion/ext x 15; SLR x 15 each LE with concentration on quad set and eccentric control; SLR/hip abduction combo x 15 each LE; SLR/hip ER circles x 15 each LE; STS from EOB with bed elevated with PT CGA to min A for safety and assistance. W/C mobility performed for UE strengthening bil UE and R LE x 83 ft and Ues only x 134 ft x 2 with PT verbal cues for energy efficiency. Forward and retro propulsions performed for anterior and posterior chain strengthening. Functional strengthening, activity tolerance, and prepping patient for home ramp activities performed x 3 sets propelling ascending backwards and descending forwards; Seated 4 lb therabar chest press x 15, bicep curls x 15, tricep extension x 20, 4 lb LAQ R LE 2x15; 1 lb therabar shoulder press x 15, wheelchair pushups 2x10, R AROM DF x 20. All performed for functional UE/LE strengthening to assist with transfers and gait. Pt wanted to concentrate on UE strengthening primarily  Theract: Bed to w/c Stand pivot hops CGA to SBA with RW; Upon return to room, pt stated he needed to use BSC. Transfers were dependent from w/c to stand x 3 bouts unsuccessfully with varied cueing issued. PT advised pt to rest x 1 min prior to attempting transfer again after unsuccessful attempts. PT trialed another technique to guard patient to standing and pt was able to stand with min A from w/c with RW to stand pivot to [facility].  After tx, pt left on [facility] with CNA  present. No complaints of pain. PT held wound vac for treatment. All needs within reach.     Therapy Documentation Precautions:  Precautions Precautions: Fall, Knee Precaution Comments: Reviewed BKA precautions Required Braces or Orthoses: Other Brace Other Brace: Ampu-shield Restrictions Weight Bearing Restrictions: Yes LLE Weight Bearing: Non weight bearing           Therapy/Group: Individual Therapy  Luna Fuse 04/13/2023, 7:14 AM

## 2023-04-13 NOTE — Progress Notes (Signed)
Physical Therapy Session Note  Patient Details  Name: [name] MRN: [id number] Date of Birth: 1960/12/17  Today's Date: 04/13/2023 PT Individual Time: 1011-1053 PT Individual Time Calculation (min): 42 min   Short Term Goals: Week 1:  PT Short Term Goal 1 (Week 1): STG = LTG 2/2 LOS  Skilled Therapeutic Interventions/Progress Updates:    Pt presents in room in bed, agreeable to PT. Pt denies pain at this time. Session focused on gait training and closed chain strengthening for RLE needed for functional transfers and ambulation. Pt completes sit<>stand transfers with min assist to RW, stand pivot transfer min assist with RW throughout session.  Pt completes bed mobility with distant supervision and prepares self for transfer by elevating bed. Pt completes stand pivot with RW to [facility], therapist assist for line management. Pt transported dependently in [facility] for time management and energy conservation to day room.  Pt completes ambulation self selected distance 48' with RW min assist with 2nd person WC follow, pt demonstrating decreased foot clearance more notable with fatigue. Pt able to recognize and verbalize fatigue and requests to sit for safety.  Pt provided with rest break then completes standing therex for RLE closed chain strengthening, completes x4 sit<>stands to RW for each trial with verbal cues for eccentric control, including: - standing heel raise 2x10  - standing mini squat x10 and x7 *verbal and visual cues provided to promote desired muscular activation and technique with task.  Pt provided with extended rest breaks between exercises due to fatigue and for improved quality with tasks.  Pt transported back to room dependently in [facility] for time management and energy conservation. Pt completes stand pivot with RW back to bed, completes bed mobility with supervision, and remains supine with all needs within reach, call light in place at end of session.  Therapy  Documentation Precautions:  Precautions Precautions: Fall, Knee Precaution Comments: Reviewed BKA precautions Required Braces or Orthoses: Other Brace Other Brace: Ampu-shield Restrictions Weight Bearing Restrictions: Yes LLE Weight Bearing: Non weight bearing   Therapy/Group: Individual Therapy  Edwin Cap PT, DPT 04/13/2023, 12:44 PM

## 2023-04-13 NOTE — Patient Care Conference (Addendum)
Inpatient RehabilitationTeam Conference and Plan of Care Update Date: 04/13/2023   Time: 11:39 AM    Patient Name: [name]      Medical Record Number: [id number]  Date of Birth: [birth year] Sex: Male         Room/Bed: 4W02C/4W02C-01 Payor Info: Payor: MEDICARE / Plan: MEDICARE PART A AND B / Product Type: *No Product type* /    Admit Date/Time:  04/08/2023  2:08 PM  Primary Diagnosis:  Left below-knee amputee [facility])  Hospital Problems: Principal Problem:   Left below-knee amputee [facility])    Expected Discharge Date: Expected Discharge Date: 04/19/23  Team Members Present: Physician leading conference: Dr. Sula Soda Social Worker Present: Lavera Guise, BSW Nurse Present: Chana Bode, RN PT Present: Wynelle Link, PT OT Present: Candee Furbish, OT PPS Coordinator present : Fae Pippin, SLP     Current Status/Progress Goal Weekly Team Focus  Bowel/Bladder   Pt is continent of bowel/bladder   Pt will remain continent of bowel/bladder   Will assess qshift and PRN    Swallow/Nutrition/ Hydration               ADL's   Set up A UB, min A LB, mod A toileting   Mod I ADLs, supervision transfers   Barriers to DC- bathroom doorway is inaccessible with traditional entry method. Plan- promote pt independence & safety with sideways entry for independence with shower access/toileting    Mobility   bed mobility supervision, sit<>stands and stand<>pivot transfers with RW and CGA/min A, gait 42ft with RW and CGA, WC mobility 177ft supervision   Mod I transfers, supervision gait  barriers: 3 STE with bilateral rails, decreased endurance, pain management    Communication                Safety/Cognition/ Behavioral Observations               Pain   Pt currently denies pain   Pt will continue to deny pain   Will assess qshift and PRN    Skin   Pt has small wound on second to on right foot and L BKA incision with wound vac   Pt's toe and BKA incision  will heal  Will assess qshift and PRN      Discharge Planning:  Discharging back home with S.O, who travels for work. Patient anticipating MOD I goals. 2 level home, 3 steps   Team Discussion: Patient  with pain and constipation post left BKA. Limited by pain and decreased endurance. Note drainage from incision with wound vac in place. Needs shoe for right foot; monitoring wound on foot.  Patient on target to meet rehab goals: yes, currently needs set up for upper body care and min assist for lower body care and for toileting. Needs min assist to ambulate to balance on the right leg.  Able to complete stand pivot transfers and sit - stand with CGA - min assist . Able to hop up to 13' with CGA.  Goals for discharge set for mod I overall, supervision for gait and mod I for transfers.   *See Care Plan and progress notes for long and short-term goals.   Revisions to Treatment Plan:  N/a   Teaching Needs: Safety, medications, skin care, transfers, etc.   Current Barriers to Discharge: Home enviroment access/layout and Lack of/limited family support  Possible Resolutions to Barriers: HH follow up services Ramp recommended for entry to home DME: TTB; has a RW and W/C  Medical Summary Current Status: phantom limb pain and residual limb pain, constipation, type 2 diabetes and obesity, anemia  Barriers to Discharge: Medical stability;Morbid Obesity  Barriers to Discharge Comments: phantom limb pain and residual limb pain, constipation, type 2 diabetes and obesity, anemia Possible Resolutions to [employer] Focus: gabapentin and cymbalta started, mirror therapy tried, continue psyllium, provided dietary education, monitor hemoglobin weekly   Continued Need for Acute Rehabilitation Level of Care: The patient requires daily medical management by a physician with specialized training in physical medicine and rehabilitation for the following reasons: Direction of a multidisciplinary  physical rehabilitation program to maximize functional independence : Yes Medical management of patient stability for increased activity during participation in an intensive rehabilitation regime.: Yes Analysis of laboratory values and/or radiology reports with any subsequent need for medication adjustment and/or medical intervention. : Yes   I attest that I was present, lead the team conference, and concur with the assessment and plan of the team.   Chana Bode B 04/13/2023, 1:53 PM

## 2023-04-14 DIAGNOSIS — Z89512 Acquired absence of left leg below knee: Secondary | ICD-10-CM | POA: Diagnosis not present

## 2023-04-14 LAB — GLUCOSE, CAPILLARY
Glucose-Capillary: 94 mg/dL (ref 70–99)
Glucose-Capillary: 105 mg/dL — ABNORMAL HIGH (ref 70–99)
Glucose-Capillary: 119 mg/dL — ABNORMAL HIGH (ref 70–99)
Glucose-Capillary: 96 mg/dL (ref 70–99)

## 2023-04-14 MED ORDER — AMLODIPINE BESYLATE 2.5 MG PO TABS
2.5000 mg | ORAL_TABLET | Freq: Every day | ORAL | Status: DC
  Administered 2023-04-15 – 2023-04-18 (×4): 2.5 mg via ORAL
  Filled 2023-04-14 (×4): qty 1

## 2023-04-14 NOTE — Discharge Summary (Signed)
Physician Discharge Summary  Patient ID: [name] MRN: [id number] DOB/AGE: Feb 19, 1961 62 y.o.  Admit date: 04/08/2023 Discharge date: 04/21/2023  Discharge Diagnoses:  Principal Problem:   Left below-knee amputee [facility]) DVT prophylaxis Acute on chronic anemia Diabetes mellitus Hypertension Diastolic/systolic congestive heart failure Morbid obesity History of alcohol marijuana use Hyperlipidemia Constipation  Discharged Condition: Stable  Significant Diagnostic Studies: DG Foot Complete Left  Result Date: 04/01/2023 CLINICAL DATA:  Osteomyelitis of left foot EXAM: LEFT FOOT - COMPLETE 3+ VIEW COMPARISON:  03/31/2023 FINDINGS: Status post transmetatarsal amputation of all 5 left toes. No visible complicating feature. No acute bony abnormality within the remaining bony structures. Plantar calcaneal spur. IMPRESSION: Postoperative changes from amputation of all 5 toes at the transmetatarsal level. No complicating feature. Electronically Signed   By: Charlett Nose M.D.   On: 04/01/2023 19:37   MR FOOT LEFT W WO CONTRAST  Result Date: 04/01/2023 CLINICAL DATA:  Concern for infection of the foot. EXAM: MRI OF THE LEFT FOREFOOT WITHOUT AND WITH CONTRAST TECHNIQUE: Multiplanar, multisequence MR imaging of the left foot was performed both before and after administration of intravenous contrast. CONTRAST:  10mL GADAVIST GADOBUTROL 1 MMOL/ML IV SOLN COMPARISON:  03/31/2023 FINDINGS: Patient motion degrades image quality limiting evaluation. Bones/Joint/Cartilage No fracture or dislocation. Hallux valgus.  No joint effusion. Small first MTP joint effusion with synovitis and subchondral marrow edema concerning for septic arthritis. Severe bone marrow edema in the fifth metatarsal head and shaft concerning for osteomyelitis. Ligaments Collateral ligaments are intact.  Lisfranc ligament is intact. Muscles and Tendons Flexor, peroneal and extensor compartment tendons are intact. Generalized muscle  atrophy. Soft tissue Soft tissue edema along the medial aspect of the forefoot concerning for cellulitis. Complex fluid collection along the medial aspect of the first metatarsal measuring 1.5 x 1.8 x 2.7 cm consistent with an abscess. Small abscess along the medial aspect of the first metatarsal head measuring 1.4 x 0.2 cm. 10 mm abscess in the soft tissues between the first and second metatarsal heads. Soft tissue edema along the dorsal aspect of the foot consistent with cellulitis. Soft tissue wound partially visualized along the plantar lateral aspect of the midfoot. No soft tissue mass. IMPRESSION: 1. Small first MTP joint effusion with synovitis and subchondral marrow edema concerning for septic arthritis. 2. Severe bone marrow edema in the fifth metatarsal head and shaft concerning for osteomyelitis. 3. Soft tissue edema along the medial aspect of the forefoot concerning for cellulitis. Complex fluid collection along the medial aspect of the first metatarsal measuring 1.5 x 1.8 x 2.7 cm consistent with an abscess. Small abscess along the medial aspect of the first metatarsal head measuring 1.4 x 0.2 cm. 10 mm abscess in the soft tissues between the first and second metatarsal heads. Electronically Signed   By: Elige Ko M.D.   On: 04/01/2023 07:51   DG Foot Complete Left  Result Date: 03/31/2023 CLINICAL DATA:  Swelling.  Pain.  Ulcer EXAM: LEFT FOOT - COMPLETE 3 VIEW COMPARISON:  X-ray 11/23/2022 FINDINGS: Severe soft tissue swelling about the midfoot which is new. There is a new fracture involving the neck of the fifth metatarsal since the prior examination. Severe osteopenia. Hallux valgus deformity of the first ray with hypertrophic degenerative changes similar to previous. Degenerative changes of the midfoot with flatfoot deformity. Large plantar calcaneal spur and mild Achilles spur. Degenerative changes of the ankle and hindfoot as well. Severe osteopenia. No definite erosive changes at this  time. If there is concern further  of bone infection, MRI or bone scan could be considered as clinically appropriate. Stable changes of previous amputation involving the digits of third ray in the distal aspect of the third metatarsal. IMPRESSION: Development of severe soft tissue swelling about the midfoot. No underlying bony erosive changes at this time. Flatfoot deformity with degenerative changes and hallux valgus deformity of the first ray. Since the prior there is a fracture of the neck of fifth metatarsal. Please correlate with time course of injury. Electronically Signed   By: Karen Kays M.D.   On: 03/31/2023 16:40   DG INJECT DIAG/THERA/INC NEEDLE/CATH/PLC EPI/LUMB/SAC W/IMG  Result Date: 03/22/2023 CLINICAL DATA:  Multilevel spinal stenosis. Low back pain, worse on the right. Leg weakness. FLUOROSCOPY: Radiation Exposure Index (as provided by the fluoroscopic device): 0 minutes 42 seconds. 59.43 micro gray meter squared PROCEDURE: The procedure, risks, benefits, and alternatives were explained to the patient. Questions regarding the procedure were encouraged and answered. The patient understands and consents to the procedure. LUMBAR EPIDURAL INJECTION: An interlaminar approach was performed on the right at L3-4. The overlying skin was cleansed and anesthetized. A 20 gauge epidural needle was advanced using loss-of-resistance technique. DIAGNOSTIC EPIDURAL INJECTION: Injection of Isovue-M 200 shows a good epidural pattern with spread above and below the level of needle placement, spreading to both sides. No vascular opacification is seen. THERAPEUTIC EPIDURAL INJECTION: Eighty mg of Depo-Medrol mixed with 2.5 cc 1% lidocaine were instilled. The procedure was well-tolerated, and the patient was discharged thirty minutes following the injection in good condition. COMPLICATIONS: None IMPRESSION: Technically successful L3-4 interlaminar epidural steroid injection. Electronically Signed   By: Paulina Fusi  M.D.   On: 03/22/2023 12:22    Labs:  Basic Metabolic Panel: No results for input(s): "NA", "K", "CL", "CO2", "GLUCOSE", "BUN", "CREATININE", "CALCIUM", "MG", "PHOS" in the last 168 hours.   CBC: Recent Labs  Lab 04/11/23 0640  WBC 4.7  NEUTROABS 3.1  HGB 8.1*  HCT 25.1*  MCV 94.0  PLT 280    CBG: Recent Labs  Lab 04/16/23 2055 04/17/23 0618 04/17/23 1135 04/17/23 1654 04/17/23 2053  GLUCAP 124* 132* 108* 124* 95   Family history.  Unknown.  Patient is adopted  Brief HPI:   [name] is a 62 y.o. right-handed male with history of chronic systolic diastolic congestive heart failure type 2 diabetes mellitus, anemia of chronic disease, morbid obesity multiple toe amputations due to recurrent diabetic foot wounds bilateral total hip arthroplasty quit smoking 8 years ago, he does have a history of alcohol cirrhosis as well as marijuana use.  Per chart review lives with his girlfriend.  Had been using a cane and a walker.  Presented 03/31/2023 with infected left diabetic foot.  MRI of the foot showed small first MTP joint effusion with synovitis and subchondral marrow edema concerning for septic arthritis.  Severe bone marrow edema in the fifth metatarsal head and shaft concerning for osteomyelitis.  Complex fluid collection along the medial aspect of the first metatarsal consistent with abscess.  Admission chemistries unremarkable except potassium 3.4 blood cultures no growth alcohol negative total CK 43 sedimentation rate 122 hemoglobin A1c 5.4 MRSA PCR screening positive.  Patient underwent initially a left transmetatarsal amputation with debridement 04/01/2023 per Dr. Ralene Cork of podiatry services with poor healing and later underwent left BKA 04/06/2023 per Dr. Lajoyce Corners with wound VAC applied.  Hospital course Lovenox for DVT prophylaxis.  Completed course of antibiotic therapy.  Contact precautions for MRSA PCR screening positive.  Acute blood  loss anemia 9.1 and will monitored.  Therapy  evaluations completed due to patient decreased functional mobility was admitted for a comprehensive rehab program.   Hospital Course: Zhamir Tomczyk was admitted to rehab 04/08/2023 for inpatient therapies to consist of PT, ST and OT at least three hours five days a week. Past admission physiatrist, therapy team and rehab RN have worked together to provide customized collaborative inpatient rehab.  Pertaining to patient's left BKA 04/06/2023 after nonhealing left transmetatarsal amputation 04/01/2023.  Surgical site healing slowly wound VAC is indicated patient would follow-up Dr. Lajoyce Corners.  Lovenox for DVT prophylaxis no bleeding episodes.  Pain manager use of gabapentin as well as Cymbalta.  Blood sugars overall controlled hemoglobin A1c 5.4 patient not on oral hypoglycemics or insulin as an outpatient.  Blood pressure monitored on Norvasc and Toprol follow-up outpatient.  Diastolic congestive heart failure exhibiting no signs of fluid overload echocardiogram January 2023 with ejection fraction of 45 to 50% grade 3 diastolic dysfunction.  Morbid obesity BMI 37.11 dietary follow-up.  History of marijuana alcohol use patient did receive counts regards to cessation of these products.  Lipitor ongoing for hyperlipidemia.  Slow transit constipation resolved with laxative assistance.   Blood pressures were monitored on TID basis and controlled  Diabetes has been monitored with ac/hs CBG checks and SSI was use prn for tighter BS control.    Rehab course: During patient's stay in rehab weekly team conferences were held to monitor patient's progress, set goals and discuss barriers to discharge. At admission, patient required minimal guard 22 feet rolling walker minimal assist step pivot transfers  Physical exam.  Blood pressure 127/70 pulse 87 temperature 97.5 respirations 18 oxygen saturation is 94% room air Constitutional.  No acute distress HEENT Head.  Normocephalic and atraumatic Eyes.  Pupils round and  reactive to light no discharge without nystagmus Neck.  Supple nontender no JVD without thyromegaly Cardiac regular rate and rhythm without any extra sounds or murmur heard Abdomen.  Soft nontender positive bowel sounds without rebound Respiratory effort normal no respiratory distress without wheeze Skin.  Left BKA with wound VAC.  Right foot with abrasion on proximal second toe Neurologic.  Alert and oriented x 3  He/She  has had improvement in activity tolerance, balance, postural control as well as ability to compensate for deficits. He/She has had improvement in functional use RUE/LUE  and RLE/LLE as well as improvement in awareness.  Sessions focused on gait training.  Complete sit to stand transfers minimal assist rolling walker stand pivot transfers minimal assist.  Completed bed mobility distant supervision and prepare self for transfers.  Ambulates 21 feet rolling walker.  Gather his belongings for activities day living homemaking.  Full family teaching completed plan discharge to home       Disposition: Discharge to home    Diet: Carb modified  Special Instructions: No driving smoking or alcohol   Medications at discharge 1.  Tylenol as needed 2.  Norvasc 2.5 mg p.o. daily 3.  Vitamin C 1000 mg p.o. daily 4.  Lipitor 80 mg p.o. daily 5.  Cymbalta 20 mg p.o. daily 6.  Folic acid 1 mg p.o. daily 7.  Neurontin 300 mg p.o. 3 times daily 8.  Magnesium 400 mg daily mg p.o. nightly 9.  Toprol-XL 25 mg p.o. daily 10.  Nitroglycerin as needed chest pain 11.  Protonix 40 mg p.o. daily 12.  Oxycodone 5 to 10 mg every 4 hours as needed pain 13.  MiraLAX daily hold for loose  stools 14.  Zinc sulfate 220 mg p.o. daily 15.  Vitamin D 2000 units daily 16.  Lasix 40 mg daily as needed 17.  Vitamin B12 1000 mcg daily 18.  Thiamine 100 mg daily 19.  Senokot S1 tablet daily 20.Campral 1 tab twice daily  30-35 minutes were spent completing discharge summary and discharge  planning  Discharge Instructions     Ambulatory referral to Physical Medicine Rehab   Complete by: As directed    Moderate complexity follow up 1-2 weeks left BKA        Follow-up Information     Raulkar, Drema Pry, MD Follow up.   Specialty: Physical Medicine and Rehabilitation Why: Office to call for appointment Contact information: [address] [place] [address] [phone]         [name], MD Follow up.   Specialty: Orthopedic Surgery Why: Call for appointment Contact information: [address] [place] [address] [phone]                 Signed: Charlton Amor 04/18/2023, 5:41 AM

## 2023-04-14 NOTE — Progress Notes (Signed)
PROGRESS NOTE   Subjective/Complaints: Wound vac is still draining Phantom limb pain is at 4-5/10, he asks about mirror therapy Had a BM yesterday without straining  ROS:  Pt denies SOB, abd pain, CP, and vision changes, phantom pain is still present but better controlled, constipation improved  Objective:   No results found. No results for input(s): "WBC", "HGB", "HCT", "PLT" in the last 72 hours.   No results for input(s): "NA", "K", "CL", "CO2", "GLUCOSE", "BUN", "CREATININE", "CALCIUM" in the last 72 hours.   Intake/Output Summary (Last 24 hours) at 04/14/2023 1037 Last data filed at 04/14/2023 0743 Gross per 24 hour  Intake 861 ml  Output 1950 ml  Net -1089 ml        Physical Exam: Vital Signs Blood pressure (!) 113/45, pulse 80, temperature 98 F (36.7 C), temperature source Oral, resp. rate 16, height 6' (1.829 m), weight 131.7 kg, SpO2 94 %. General: awake, alert, appropriate, sitting up in bed; NAD, BMI 39.38 HENT: conjugate gaze; oropharynx moist CV: regular rate and rhythm; no JVD, hypotensive Pulmonary: CTA B/L; no W/R/R- good air movement GI: soft, NT,somewhat distended; hypoactive BS Psychiatric: appropriate- interactive Neurological: Ox3 Extremities: wound vac still draining Musculoskeletal: limb guard and shrinker/VAC in place      General: Swelling and tenderness present. Normal range of motion.     Cervical back: Normal range of motion.  Skin:    Comments: Left BKA with wound VAC in place. 1/2 full cannister on VAC with sanguinous drainage  Right foot with abrasion on proximal second toe d/t severe hallux valgus, foot deformity. Right heel slightly boggy  Neurological:     Mental Status: He is alert.     Comments: Alert and oriented x 3. Normal insight and awareness. Intact Memory. Normal language and speech. Cranial nerve exam unremarkable. MMT: 5/5 UE. RLE grossly 4+ to 5/5. LLE 3/5 prox,  limited by pain/vac.  Stocking glove sensory loss RLE below knee, severe below sock line. DTR's trace to absent.   Psychiatric:        Mood and Affect: Mood normal.        Behavior: Behavior normal.    Assessment/Plan: 1. Functional deficits which require 3+ hours per day of interdisciplinary therapy in a comprehensive inpatient rehab setting. Physiatrist is providing close team supervision and 24 hour management of active medical problems listed below. Physiatrist and rehab team continue to assess barriers to discharge/monitor patient progress toward functional and medical goals  Care Tool:  Bathing    Body parts bathed by patient: Right arm, Left arm, Chest, Abdomen, Right upper leg, Left upper leg, Face   Body parts bathed by helper: Right lower leg     Bathing assist Assist Level: Minimal Assistance - Patient > 75%     Upper Body Dressing/Undressing Upper body dressing   What is the patient wearing?: Pull over shirt    Upper body assist Assist Level: Set up assist    Lower Body Dressing/Undressing Lower body dressing      What is the patient wearing?: Underwear/pull up     Lower body assist Assist for lower body dressing: Moderate Assistance - Patient 50 - 74%  Toileting Toileting Toileting Activity did not occur Press photographer and hygiene only): N/A (no void or bm)  Toileting assist Assist for toileting: Minimal Assistance - Patient > 75%     Transfers Chair/bed transfer  Transfers assist     Chair/bed transfer assist level: Minimal Assistance - Patient > 75%     Locomotion Ambulation   Ambulation assist      Assist level: 2 helpers (min assist and 2nd person WC follow) Assistive device: Walker-rolling Max distance: 21'   Walk 10 feet activity   Assist     Assist level: 2 helpers Assistive device: Walker-rolling   Walk 50 feet activity   Assist    Assist level: Minimal Assistance - Patient > 75%      Walk 150 feet  activity   Assist Walk 150 feet activity did not occur: Safety/medical concerns (2/2 limited endurance)         Walk 10 feet on uneven surface  activity   Assist Walk 10 feet on uneven surfaces activity did not occur: Safety/medical concerns (2/2 limited endurance)         Wheelchair     Assist Is the patient using a wheelchair?: Yes Type of Wheelchair: Manual    Wheelchair assist level: Supervision/Verbal cueing Max wheelchair distance: 200    Wheelchair 50 feet with 2 turns activity    Assist        Assist Level: Supervision/Verbal cueing   Wheelchair 150 feet activity     Assist      Assist Level: Supervision/Verbal cueing   Blood pressure (!) 113/45, pulse 80, temperature 98 F (36.7 C), temperature source Oral, resp. rate 16, height 6' (1.829 m), weight 131.7 kg, SpO2 94 %.  Medical Problem List and Plan: 1. Functional deficits secondary to left BKA 04/06/2023 after nonhealing left transmetatarsal amputation of 04/01/2023.  Wound VAC is indicated, will touch base with surgery when this can be removed             -patient may not yet shower             -ELOS/Goals: 7-10 days, mod I goals with PT, OT           Continue CIR PT and OT  2.  Impaired mobility: continue Lovenox             -antiplatelet therapy: N/A  3. Pain Management: Oxycodone as needed             -will add low dose gabapentin 100mg  tid for phantom limb pain. Add cymbalta 20mg  daily. Asked Tobi Bastos PT to perform mirror therapy    4. Insomnia: increase gabapentin to 300mg , improved, continue   5. Neuropsych/cognition: This patient is capable of making decisions on his own behalf. 6. Skin/Wound Care: Routine skin checks 7. Fluids/Electrolytes/Nutrition: Routine in and outs with follow-up chemistries 8.  Acute on chronic anemia.  Follow-up CBC 9.  Hyperglycemia: d/c Juven  10.  Hypertension.  Norvasc 5 mg daily, Toprol-XL 25 mg daily.  Monitor with increased mobility. Increase  Magnesium gluconate to 500mg  HS  11.  Diastolic/systolic congestive heart failure.  Monitor for any signs of fluid overload.  Echocardiogram January 2023 with ejection fraction of 45 to 50% grade 3 diastolic dysfunction. 12.  Morbid Obesity. Due to having DM BMI 37.11.  Dietary follow-up  13.  History of alcohol as well as marijuana use.  Provide counseling. Increase magnesium gluconate to 500mg  HS  14.  Hyperlipidemia.  Resolved. Patient is on Lipitor.  HDL reviewed and is low, other lipid panel markers are in normal rangge  15. Slow transit constipation:              -Continue senokot-s at bedtime for maintenance             -add psyllium daily  -asked nursing to bring him prune juice   Discussed that constipation has improved, had regular BM without straining on 6/26. D/c miralax.   16. Screening for vitamin D deficiency: vitamin D reviewed and is normal  17. Tachypnea: incentive spirometer ordered. Placed nursing order to encourage use of incentive spirometer 10/hour while awake to prevent pneumonia and strengthen lungs.    18. Hypotensive: decrease amlodipine to 2.5mg  daily  LOS: 6 days A FACE TO FACE EVALUATION WAS PERFORMED  Drema Pry Aimee Heldman 04/14/2023, 10:37 AM

## 2023-04-14 NOTE — Progress Notes (Signed)
Physical Therapy Session Note  Patient Details  Name: [name] MRN: [id number] Date of Birth: Aug 16, 1961  Today's Date: 04/14/2023 PT Individual Time: 2130-8657; 235-335 PT Individual Time Calculation (min): 75 min ; 60  Short Term Goals: Week 1:  PT Short Term Goal 1 (Week 1): STG = LTG 2/2 LOS  Skilled Therapeutic Interventions/Progress Updates:   Session 1: Pt in bed upon therapy arrival; agreeable to PT therapy session. Reports no pain. Supine to sit to supine supervision. STS from EOB with bed elevated CGA. Stand pivot with RW bed to wheelchair CGA.   Therex: W/C mobility x 162 ft for UE strengthening and endurance to therapy gym; 1 lb shoulder press x 15, 4 lb unilateral bicep curls 2x15, 4 lb therabar chest press x 10; 1 lb therabar bus drivers x 10, UBE retro L5 x 6 min with PT verbal cues for posterior chain strengthening; standing activities with RW 3x1' each using mirror for postural reinforcement; STS from w/c with RW CGA x 5 with concentration on eccentric descent; seated SLR off 6" step R LE 2x20; wheelchair pushups 2x15; w/c mobility x 162 ft for UE strengthening back to room. Gait x 6 ft RW using hopping method R LE CGA w/c to bed. PT with line management throughout for wound vac.  Pt returned to bed supine; no complaints of pain; nursing arrived to speak with patient. Bed alarm set; all needs within reach.   Session 2:  Pt received sitting EOB on his phone. Pt agreeable to PT tx. No complaints of pain.  Gait Training: Gt x 36 ft, x 26 ft with RW CGA and verbal cues for R LE foot clearance and proper weightshift.   Therex: STS CGA x 5 with concentration on control with RW; seated weighted green ball chest press iso x 20 with 2-3 sec press; seated Green weighted ball oblique reaches over lower extremities x 20 each direction; YTB chest pull x 20; YTB tricep ext x 20; R hamstring dig seated x 20; R glute bridge iso in chair x 30; R quad contraction pushing backwards in  chair against PT resistance x 10 with 3-5 sec hold 2x10; R Hamstring Dig against PT resistance x 10 with 3-5 sec hold; w/c mobility x 134 ft x 2 for functional UE strengthening.  All performed to increase functional strengthening and endurance. Pt returned to bed after PT tx; bed alarm on; all needs within reach. Supine to sit transfers S; stand pivot bed to wheelchair CGA.     Therapy Documentation Precautions:  Precautions Precautions: Fall, Knee Precaution Comments: Reviewed BKA precautions Required Braces or Orthoses: Other Brace Other Brace: Ampu-shield Restrictions Weight Bearing Restrictions: Yes LLE Weight Bearing: Non weight bearing   Therapy/Group: Individual Therapy  Luna Fuse 04/14/2023, 12:12 PM

## 2023-04-15 LAB — GLUCOSE, CAPILLARY
Glucose-Capillary: 89 mg/dL (ref 70–99)
Glucose-Capillary: 119 mg/dL — ABNORMAL HIGH (ref 70–99)
Glucose-Capillary: 120 mg/dL — ABNORMAL HIGH (ref 70–99)
Glucose-Capillary: 132 mg/dL — ABNORMAL HIGH (ref 70–99)

## 2023-04-15 NOTE — Group Note (Signed)
Patient Details Name: [name] MRN: [id number] DOB: [birth year] Today's Date: 04/15/2023  Time Calculation: OT Group Time Calculation OT Group Start Time: 1100 OT Group Stop Time: 1200 OT Group Time Calculation (min): 60 min      Group Description: BUE Therex Group: Pt participated in group session with a focus on BUE strength and endurance to facilitate improved activity tolerance and strength for higher level BADLs and functional mobility tasks.    Individual level documentation: Pt first in engaged in seated warm up where pt completed arm circles, overhead reach, forward punch- all un-weighted.  Next, pt completed seated bicep curls, shoulder flexion/extension, forward punches, and upright rows with a 3lb dowel. Patient able to complete 3x10  reps.  Music used to promote engagement and mood elevation. Group setting used to promote improved mood and psychosocial adjustment to limb loss, as well as generalized stress management. Intermittent rest breaks provided as needed on an individualized basis.   Ended session with guide deep breathing and stretching for 3 mins. Discussed benefits of deep breathing to manage stress and pain. Pt propelled his w/c back to room with (S). Pt left sitting up with all needs within reach and bed alarm/chair alarm activated.   Pain: Pain Assessment Pain Scale: 0-10 Pain Score: 0-No pain    [name] 04/15/2023, 12:59 PM

## 2023-04-15 NOTE — Progress Notes (Signed)
PROGRESS NOTE   Subjective/Complaints: Patient does not feel he will be ready to go home on Tuesday and would like a couple of extra days here to learn wound care once the vac is removed  ROS:  Pt denies SOB, abd pain, CP, and vision changes, phantom pain is still present but better controlled, constipation improved  Objective:   No results found. No results for input(s): "WBC", "HGB", "HCT", "PLT" in the last 72 hours.   No results for input(s): "NA", "K", "CL", "CO2", "GLUCOSE", "BUN", "CREATININE", "CALCIUM" in the last 72 hours.   Intake/Output Summary (Last 24 hours) at 04/15/2023 0954 Last data filed at 04/15/2023 1308 Gross per 24 hour  Intake 835 ml  Output 2900 ml  Net -2065 ml        Physical Exam: Vital Signs Blood pressure 133/82, pulse 70, temperature 98.5 F (36.9 C), resp. rate 16, height 6' (1.829 m), weight 126.7 kg, SpO2 97 %. General: awake, alert, appropriate, sitting up in bed; NAD, BMI 37.88 HENT: conjugate gaze; oropharynx moist CV: regular rate and rhythm; no JVD, hypotensive Pulmonary: CTA B/L; no W/R/R- good air movement GI: soft, NT,somewhat distended; hypoactive BS Psychiatric: appropriate- interactive Neurological: Ox3 Extremities: wound vac still draining Musculoskeletal: limb guard and shrinker/VAC in place      General: Swelling and tenderness present. Normal range of motion.     Cervical back: Normal range of motion.  Skin:    Comments: Left BKA with wound VAC in place. 1/2 full cannister on VAC with sanguinous drainage  Right foot with abrasion on proximal second toe d/t severe hallux valgus, foot deformity. Right heel slightly boggy  Neurological:     Mental Status: He is alert.     Comments: Alert and oriented x 3. Normal insight and awareness. Intact Memory. Normal language and speech. Cranial nerve exam unremarkable. MMT: 5/5 UE. RLE grossly 4+ to 5/5. LLE 3/5 prox, limited by  pain/vac.  Stocking glove sensory loss RLE below knee, severe below sock line. DTR's trace to absent.   Psychiatric:        Mood and Affect: Mood normal.        Behavior: Behavior normal.    Assessment/Plan: 1. Functional deficits which require 3+ hours per day of interdisciplinary therapy in a comprehensive inpatient rehab setting. Physiatrist is providing close team supervision and 24 hour management of active medical problems listed below. Physiatrist and rehab team continue to assess barriers to discharge/monitor patient progress toward functional and medical goals  Care Tool:  Bathing    Body parts bathed by patient: (P) Right arm   Body parts bathed by helper: Right lower leg     Bathing assist Assist Level: Minimal Assistance - Patient > 75%     Upper Body Dressing/Undressing Upper body dressing   What is the patient wearing?: Pull over shirt    Upper body assist Assist Level: Set up assist    Lower Body Dressing/Undressing Lower body dressing      What is the patient wearing?: Underwear/pull up     Lower body assist Assist for lower body dressing: Moderate Assistance - Patient 50 - 74%     Toileting Toileting  Toileting Activity did not occur Press photographer and hygiene only): N/A (no void or bm)  Toileting assist Assist for toileting: Minimal Assistance - Patient > 75%     Transfers Chair/bed transfer  Transfers assist     Chair/bed transfer assist level: Minimal Assistance - Patient > 75%     Locomotion Ambulation   Ambulation assist      Assist level: 2 helpers (min assist and 2nd person WC follow) Assistive device: Walker-rolling Max distance: 21'   Walk 10 feet activity   Assist     Assist level: 2 helpers Assistive device: Walker-rolling   Walk 50 feet activity   Assist    Assist level: Minimal Assistance - Patient > 75%      Walk 150 feet activity   Assist Walk 150 feet activity did not occur: Safety/medical  concerns (2/2 limited endurance)         Walk 10 feet on uneven surface  activity   Assist Walk 10 feet on uneven surfaces activity did not occur: Safety/medical concerns (2/2 limited endurance)         Wheelchair     Assist Is the patient using a wheelchair?: Yes Type of Wheelchair: Manual    Wheelchair assist level: Supervision/Verbal cueing Max wheelchair distance: 200    Wheelchair 50 feet with 2 turns activity    Assist        Assist Level: Supervision/Verbal cueing   Wheelchair 150 feet activity     Assist      Assist Level: Supervision/Verbal cueing   Blood pressure 133/82, pulse 70, temperature 98.5 F (36.9 C), resp. rate 16, height 6' (1.829 m), weight 126.7 kg, SpO2 97 %.  Medical Problem List and Plan: 1. Functional deficits secondary to left BKA 04/06/2023 after nonhealing left transmetatarsal amputation of 04/01/2023.  Wound VAC is indicated, will touch base with surgery when this can be removed             -patient may not yet shower             -ELOS/Goals: 11 days, mod I goals with PT, OT           Continue CIR PT and OT  Messaged the team to discuss 2 day extension for patient's ramp to be created, him to continue to feel better regarding ambulation and transfer to bathroom, and for wound care education once vac is removed  2.  Impaired mobility: continue Lovenox             -antiplatelet therapy: N/A  3. Pain Management: Oxycodone as needed             -will add low dose gabapentin 100mg  tid for phantom limb pain. Add cymbalta 20mg  daily. Asked Tobi Bastos PT to perform mirror therapy    4. Insomnia: increase gabapentin to 300mg , improved, continue   5. Neuropsych/cognition: This patient is capable of making decisions on his own behalf. 6. Wound vac: placed order for removal once there is no longer drainage 7. Fluids/Electrolytes/Nutrition: Routine in and outs with follow-up chemistries 8.  Acute on chronic anemia.  Follow-up CBC 9.   Hyperglycemia: d/c Juven  10.  Hypertension.  Norvasc 5 mg daily, Toprol-XL 25 mg daily.  Monitor with increased mobility. Increase Magnesium gluconate to 500mg  HS  11.  Diastolic/systolic congestive heart failure.  Monitor for any signs of fluid overload.  Echocardiogram January 2023 with ejection fraction of 45 to 50% grade 3 diastolic dysfunction. 12.  Morbid Obesity. Due to  having DM BMI 37.11.  Dietary follow-up  13.  History of alcohol as well as marijuana use.  Provide counseling. Increase magnesium gluconate to 500mg  HS  14.  Hyperlipidemia.  Resolved. Patient is on Lipitor. HDL reviewed and is low, other lipid panel markers are in normal rangge  15. Slow transit constipation:              -Continue senokot-s at bedtime for maintenance             -add psyllium daily  -asked nursing to bring him prune juice   Discussed that constipation has improved, had regular BM without straining on 6/26. D/c miralax.   16. Screening for vitamin D deficiency: vitamin D reviewed and is normal  17. Tachypnea: incentive spirometer ordered. Placed nursing order to encourage use of incentive spirometer 10/hour while awake to prevent pneumonia and strengthen lungs.    18. Hypotensive: decrease amlodipine to 2.5mg  daily, maintain this dose.   LOS: 7 days A FACE TO FACE EVALUATION WAS PERFORMED  Drema Pry Minas Bonser 04/15/2023, 9:54 AM

## 2023-04-15 NOTE — Progress Notes (Signed)
Occupational Therapy Session Note  Patient Details  Name: [name] MRN: [id number] Date of Birth: 11-Mar-1961  Today's Date: 04/15/2023 OT Individual Time: 0955-1050 & 4098-1191 OT Individual Time Calculation (min): 55 min & 40 min   Short Term Goals: Week 1:  OT Short Term Goal 1 (Week 1): STGs= LTGs due to short ELOS  Skilled Therapeutic Interventions/Progress Updates:  Session 1 Skilled OT intervention completed with focus on DC planning, ADL retraining and functional ambulation. Pt received upright in bed, agreeable to session. No pain reported.  Per MD, then pt in session, pt with apprehension about current 7/2 DC date. Discussed with pt in session about status of ramp in which pt is still awaiting quotes from several companies about cost, with no current date set for installation. MD also informed that wound vac may can be removed early next week, and OT in agreement that pt will need hands on education regarding limb care to ensure proper wound healing at mod I level at home. OT advocated for DC to be extended to 7/4 for ramp installation for accessibility into pt's home as stairs are inappropriate at this time, and for continued progression with mobility and independence. Pt in agreement.  Transitioned to EOB with mod I. Donned limb guard and R shoe with set up A. From elevated bed, CGA sit > stand using RW, then CGA step hop ambulatory transfer with RW to [facility] over toilet, with assist needed for wound vac management, cues for over threshold for smaller hops vs big to not throw off balance, and for positioning when transitioning in front of BSC. CGA for balance with time needed to alternate UE for doffing of pants. Continent of void/BM; charted. Able to wipe with distant supervision while seated. CGA sit > stand with RW, then light min A for balance with unilateral UE support for donning of LB clothing over hips. CGA stand pivot to w/c with RW.   Donned bilateral leg rests with total A  for time. Then self-propelled in w/c about 33ft with supervision, then OT took over dependently due to time constraint to gym for group session. Pt remained seated in w/c, with members of therapy group present and all immediate needs met at end of session.  Session 2 Skilled OT intervention completed with focus on family education with girlfriend present. Pt received upright in bed, agreeable to session. No pain reported.  OT updated girlfriend on DC date extension and reasons why with additional family ed session coordinated for early next week as pt and girlfriend have several questions and will need adequate training on limb care when wound vac removed. Notified MD of girlfriends request for a phone call to discuss wound vac plans.  Educated girlfriend on/demo of the following: -wound vac mechanism with negative pressure & typical removal process -Shrinker purpose -limb guard purpose for protection with mobility and contracture prevention -ramp installation plan with 3 companies being looked at and quotes for each -Transitioned to EOB with mod I (limb guard already on). CGA sit > stand from elevated bed using RW then short step hop ambulatory transfer with CGA to w/c. -Reviewed goals of mod I, however supervision ambulation including side hopping into bathroom with plan to review with hands on practice in later sessions -Discussed TTB need if planning to shower but will need to have vac removed prior to shower in CIR or at home  Light min A using RW, then stand pivot with CGA back to EOB. Pt remained seated EOB, with  bed alarm on/activated, girlfriend present and with all needs in reach at end of session.   Therapy Documentation Precautions:  Precautions Precautions: Fall, Knee Precaution Comments: Reviewed BKA precautions Required Braces or Orthoses: Other Brace Other Brace: Ampu-shield Restrictions Weight Bearing Restrictions: Yes LLE Weight Bearing: Non weight  bearing    Therapy/Group: Individual Therapy  [name], MS, OTR/L  04/15/2023, 3:35 PM

## 2023-04-15 NOTE — Progress Notes (Signed)
Patient ID: [name], male   DOB: 21-Jan-1961, 62 y.o.   MRN: [id number]  [facility] referral sent to [facility].

## 2023-04-15 NOTE — Progress Notes (Signed)
Patient ID: [name], male   DOB: Mar 17, 1961, 62 y.o.   MRN: [id number]  Family education arranged tomorrow 1-3 PM.

## 2023-04-15 NOTE — Progress Notes (Signed)
Physical Therapy Session Note  Patient Details  Name: [name] MRN: [id number] Date of Birth: 08-17-1961  Today's Date: 04/15/2023 PT Individual Time: 1505-1530 PT Individual Time Calculation (min): 25 min   Short Term Goals: Week 1:  PT Short Term Goal 1 (Week 1): STG = LTG 2/2 LOS  Skilled Therapeutic Interventions/Progress Updates:    Pt received seated at EOB and agrees to therapy. Reports burning pain in residual limb. Number not provided. PT provides rest breaks as needed to manage pain. Pt performs stand step transfer from bed to [facility] with CGA and cues for sequencing and positioning. Pt self propels WC x150 to gym with bilateral upper extremities and no additional assistance. Performed for endurance training. Pt participates in standing balance activity, tasked with retrieving clothespins hung from basketball net slightly above shoulder level. Pt completes x2 reps of sit to stand with CGA/minA to facilitate anterior weight shift. In standing, PT provides verbal and tactile cues for trunk and hip extension, as well as lateral weight shifting to retrieve clothespins with alternating upper extremities. Pt completes x2 bouts with similar assistance. Pt then self propels WC back to room. Stand step to bed with CGA/minA and cues for initiation and positioning. Left seated at EOB with all needs within reach.   Therapy Documentation Precautions:  Precautions Precautions: Fall, Knee Precaution Comments: Reviewed BKA precautions Required Braces or Orthoses: Other Brace Other Brace: Ampu-shield Restrictions Weight Bearing Restrictions: Yes LLE Weight Bearing: Non weight bearing   Therapy/Group: Individual Therapy  Beau Fanny, PT, DPT 04/15/2023, 7:34 PM

## 2023-04-16 LAB — GLUCOSE, CAPILLARY
Glucose-Capillary: 133 mg/dL — ABNORMAL HIGH (ref 70–99)
Glucose-Capillary: 101 mg/dL — ABNORMAL HIGH (ref 70–99)
Glucose-Capillary: 126 mg/dL — ABNORMAL HIGH (ref 70–99)
Glucose-Capillary: 124 mg/dL — ABNORMAL HIGH (ref 70–99)

## 2023-04-16 NOTE — Progress Notes (Signed)
PROGRESS NOTE   Subjective/Complaints:  Pt reports pain 2/10- "not too bad"- LBM yesterday  Said wanted to sleep  actually fell asleep in middle of speaking with me.     ROS:   Pt denies SOB, abd pain, CP, N/V/C/D, and vision changes  Except for HPI Objective:   No results found. No results for input(s): "WBC", "HGB", "HCT", "PLT" in the last 72 hours.   No results for input(s): "NA", "K", "CL", "CO2", "GLUCOSE", "BUN", "CREATININE", "CALCIUM" in the last 72 hours.   Intake/Output Summary (Last 24 hours) at 04/16/2023 1559 Last data filed at 04/16/2023 1328 Gross per 24 hour  Intake 660 ml  Output 1000 ml  Net -340 ml        Physical Exam: Vital Signs Blood pressure 113/61, pulse 80, temperature 98.2 F (36.8 C), resp. rate 20, height 6' (1.829 m), weight 121.3 kg, SpO2 92 %.     General: initially asleep- woke easily, but kept falling asleep; NAD HENT: conjugate gaze; oropharynx moist CV: regular rate and rhythm; no JVD Pulmonary: CTA B/L; no W/R/R- good air movement GI: soft, NT, ND, (+)BS- protuberant Psychiatric: appropriate except sleepy Neurological: Ox3 except fell asleep in middle of speaking with me- in the middle of him speaking  Extremities: wound vac still draining Musculoskeletal: limb guard and shrinker/VAC in place      General: Swelling and tenderness present. Normal range of motion.     Cervical back: Normal range of motion.  Skin:    Comments: Left BKA with wound VAC in place. 1/2 full cannister on VAC with sanguinous drainage  Right foot with abrasion on proximal second toe d/t severe hallux valgus, foot deformity. Right heel slightly boggy  Neurological:     Mental Status: He is alert.     Comments: Alert and oriented x 3. Normal insight and awareness. Intact Memory. Normal language and speech. Cranial nerve exam unremarkable. MMT: 5/5 UE. RLE grossly 4+ to 5/5. LLE 3/5 prox,  limited by pain/vac.  Stocking glove sensory loss RLE below knee, severe below sock line. DTR's trace to absent.   Psychiatric:        Mood and Affect: Mood normal.        Behavior: Behavior normal.    Assessment/Plan: 1. Functional deficits which require 3+ hours per day of interdisciplinary therapy in a comprehensive inpatient rehab setting. Physiatrist is providing close team supervision and 24 hour management of active medical problems listed below. Physiatrist and rehab team continue to assess barriers to discharge/monitor patient progress toward functional and medical goals  Care Tool:  Bathing    Body parts bathed by patient: (P) Right arm   Body parts bathed by helper: Right lower leg     Bathing assist Assist Level: Minimal Assistance - Patient > 75%     Upper Body Dressing/Undressing Upper body dressing   What is the patient wearing?: Pull over shirt    Upper body assist Assist Level: Set up assist    Lower Body Dressing/Undressing Lower body dressing      What is the patient wearing?: Underwear/pull up     Lower body assist Assist for lower body dressing: Moderate Assistance -  Patient 50 - 74%     Toileting Toileting Toileting Activity did not occur Press photographer and hygiene only): N/A (no void or bm)  Toileting assist Assist for toileting: Minimal Assistance - Patient > 75%     Transfers Chair/bed transfer  Transfers assist     Chair/bed transfer assist level: Contact Guard/Touching assist     Locomotion Ambulation   Ambulation assist      Assist level: Contact Guard/Touching assist Assistive device: Walker-rolling Max distance: 25   Walk 10 feet activity   Assist     Assist level: Contact Guard/Touching assist Assistive device: Walker-rolling   Walk 50 feet activity   Assist    Assist level: Minimal Assistance - Patient > 75%      Walk 150 feet activity   Assist Walk 150 feet activity did not occur:  Safety/medical concerns (2/2 limited endurance)         Walk 10 feet on uneven surface  activity   Assist Walk 10 feet on uneven surfaces activity did not occur: Safety/medical concerns (2/2 limited endurance)         Wheelchair     Assist Is the patient using a wheelchair?: Yes Type of Wheelchair: Manual    Wheelchair assist level: Supervision/Verbal cueing Max wheelchair distance: 200    Wheelchair 50 feet with 2 turns activity    Assist        Assist Level: Supervision/Verbal cueing   Wheelchair 150 feet activity     Assist      Assist Level: Supervision/Verbal cueing   Blood pressure 113/61, pulse 80, temperature 98.2 F (36.8 C), resp. rate 20, height 6' (1.829 m), weight 121.3 kg, SpO2 92 %.  Medical Problem List and Plan: 1. Functional deficits secondary to left BKA 04/06/2023 after nonhealing left transmetatarsal amputation of 04/01/2023.  Wound VAC is indicated, will touch base with surgery when this can be removed             -patient may not yet shower             -ELOS/Goals: 11 days, mod I goals with PT, OT           Continue CIR PT and OT  Messaged the team to discuss 2 day extension for patient's ramp to be created, him to continue to feel better regarding ambulation and transfer to bathroom, and for wound care education once vac is removed  Con't CIR PT and OT 2.  Impaired mobility: continue Lovenox             -antiplatelet therapy: N/A  3. Pain Management: Oxycodone as needed             -will add low dose gabapentin 100mg  tid for phantom limb pain. Add cymbalta 20mg  daily. Asked Tobi Bastos PT to perform mirror therapy   6/29- pt reports pain 2/10- doing better 4. Insomnia: increase gabapentin to 300mg , improved, continue   6/29- fell asleep when speaking to me- change made last week 5. Neuropsych/cognition: This patient is capable of making decisions on his own behalf. 6. Wound vac: placed order for removal once there is no longer  drainage 7. Fluids/Electrolytes/Nutrition: Routine in and outs with follow-up chemistries 8.  Acute on chronic anemia.  Follow-up CBC 9.  Hyperglycemia: d/c Juven  10.  Hypertension.  Norvasc 5 mg daily, Toprol-XL 25 mg daily.  Monitor with increased mobility. Increase Magnesium gluconate to 500mg  HS  6/29- BP controlled- con't regimen 11.  Diastolic/systolic congestive heart failure.  Monitor for any signs of fluid overload.  Echocardiogram January 2023 with ejection fraction of 45 to 50% grade 3 diastolic dysfunction. 12.  Morbid Obesity. Due to having DM BMI 37.11.  Dietary follow-up  6/29- BMI down to 36.27 13.  History of alcohol as well as marijuana use.  Provide counseling. Increase magnesium gluconate to 500mg  HS  14.  Hyperlipidemia.  Resolved. Patient is on Lipitor. HDL reviewed and is low, other lipid panel markers are in normal rangge  15. Slow transit constipation:              -Continue senokot-s at bedtime for maintenance             -add psyllium daily  -asked nursing to bring him prune juice   Discussed that constipation has improved, had regular BM without straining on 6/26. D/c miralax.   6/29- LBM yesterday- con't regimen 16. Screening for vitamin D deficiency: vitamin D reviewed and is normal  17. Tachypnea: incentive spirometer ordered. Placed nursing order to encourage use of incentive spirometer 10/hour while awake to prevent pneumonia and strengthen lungs.    18. Hypotensive: decrease amlodipine to 2.5mg  daily, maintain this dose.   6/29- better control     LOS: 8 days A FACE TO FACE EVALUATION WAS PERFORMED  [name] 04/16/2023, 3:59 PM

## 2023-04-16 NOTE — Progress Notes (Signed)
Occupational Therapy Session Note  Patient Details  Name: [name] MRN: [id number] Date of Birth: September 21, 1961  Today's Date: 04/16/2023 OT Individual Time: 1350-1432 OT Individual Time Calculation (min): 42 min    Short Term Goals: Week 1:  OT Short Term Goal 1 (Week 1): STGs= LTGs due to short ELOS  Skilled Therapeutic Interventions/Progress Updates:  Skilled OT intervention completed with focus on family education with girlfriend present regarding tub/shower transfers and shower safety education. Pt received seated in w/c with direct handoff from PT in hallway, agreeable to session. No pain reported.  Transported pt dependently in w/c for time > ADL bathroom. Education provided on DME available such as tub bench that can be purchased for use at home, suggested use of [facility] for ease of bathing, shower curtain management to prevent water spillage, minimizing stands via lateral leans for pericare, use of grab bars/installation as well as effective safety strategies for exiting the shower to eliminate falls.   CGA sit > stand using RW then side stepped with RW and CGA (will need to enter bathroom door this way) to in front of TTB. Able to lower with control to TTB with CGA. Supervision pivot on tub bench. OT had previously heightened the TTB as pt had difficulty standing last trial at lower setting. Able to stand with CGA using RW then step hop in similar manner back to w/c. Wrote down specific measurements that girlfriend plans to measure and report on Monday to OT about bathroom accessibility.  Back in room, CGA sit > stand using RW then CGA stand pivot to EOB.Pt remained seated EOB, with bed alarm on/activated, girlfriend present and with all needs in reach at end of session.   Therapy Documentation Precautions:  Precautions Precautions: Fall, Knee Precaution Comments: Reviewed BKA precautions Required Braces or Orthoses: Other Brace Other Brace: Ampu-shield Restrictions Weight  Bearing Restrictions: Yes LLE Weight Bearing: Non weight bearing    Therapy/Group: Individual Therapy  [name], MS, OTR/L  04/16/2023, 4:00 PM

## 2023-04-16 NOTE — Progress Notes (Signed)
Physical Therapy Session Note  Patient Details  Name: [name] MRN: [id number] Date of Birth: October 01, 1961  Today's Date: 04/16/2023 PT Individual Time: 1300-1350 PT Individual Time Calculation (min): 50 min   Short Term Goals: Week 1:  PT Short Term Goal 1 (Week 1): STG = LTG 2/2 LOS  Skilled Therapeutic Interventions/Progress Updates:  Pt was seen bedside in the pm. Pt sitting on edge of bed. Pt transferred sit to stand with rolling walker and c/g. Pt ambulated about 15 feet with rolling walker and c/g with verbal cues. Pt performed toilet transfers wit c/g and verbal cues. Pt transferred back to w/c with rolling walker and c/g with verbal cues. Pt's significant other, Cory Roughen, present for family training. As per pt, will have ramp installed prior to discharge. Pt's significant other educated on use of gait belt. Pt performed transfers and car transfers with rolling walker and c/g with verbal cues. Discussed with pt and significant other about planning transfers prior to performing. Pt ambulated about 25 feet with rolling walker and c/g and verbal cues. Pt's significant other demonstrated bed to chair transfers, car transfers, and ambulation 25 feet with rolling walker and c/g with verbal cues. Ala questions answered following training. Pt left with OT following treatment for family training.   Therapy Documentation Precautions:  Precautions Precautions: Fall, Knee Precaution Comments: Reviewed BKA precautions Required Braces or Orthoses: Other Brace Other Brace: Ampu-shield Restrictions Weight Bearing Restrictions: Yes LLE Weight Bearing: Non weight bearing General:   Pain: No c/o pain.   Therapy/Group: Individual Therapy  Rayford Halsted 04/16/2023, 3:36 PM

## 2023-04-17 LAB — GLUCOSE, CAPILLARY
Glucose-Capillary: 132 mg/dL — ABNORMAL HIGH (ref 70–99)
Glucose-Capillary: 108 mg/dL — ABNORMAL HIGH (ref 70–99)
Glucose-Capillary: 124 mg/dL — ABNORMAL HIGH (ref 70–99)
Glucose-Capillary: 95 mg/dL (ref 70–99)

## 2023-04-17 NOTE — Progress Notes (Signed)
PROGRESS NOTE   Subjective/Complaints:  Pt reports LBM x 2 yesterday Drainage still occurring and more with movement/therapy.  Asking when will be removed, so can then learn to manage wound before goes home.   ROS:   Pt denies SOB, abd pain, CP, N/V/C/D, and vision changes   Except for HPI Objective:   No results found. No results for input(s): "WBC", "HGB", "HCT", "PLT" in the last 72 hours.   No results for input(s): "NA", "K", "CL", "CO2", "GLUCOSE", "BUN", "CREATININE", "CALCIUM" in the last 72 hours.   Intake/Output Summary (Last 24 hours) at 04/17/2023 1052 Last data filed at 04/17/2023 0832 Gross per 24 hour  Intake 1076 ml  Output 2400 ml  Net -1324 ml        Physical Exam: Vital Signs Blood pressure 136/82, pulse 86, temperature 97.9 F (36.6 C), resp. rate 20, height 6' (1.829 m), weight 121.8 kg, SpO2 97 %.      General: awake, alert, appropriate, sitting up in bed; NAD HENT: conjugate gaze; oropharynx moist CV: regular rate and rhythm; no JVD Pulmonary: CTA B/L; no W/R/R- good air movement GI: soft, NT, ND, (+)BS Psychiatric: appropriate- interactive Neurological: Ox3 Much more awake, alert today Extremities: wound vac still draining Musculoskeletal: limb guard and shrinker/VAC in place      General: Swelling and tenderness present. Normal range of motion.     Cervical back: Normal range of motion.  Skin:    Comments: Left BKA with wound VAC in place. 1/2 full cannister on VAC with sanguinous drainage- up slightly to 5th mark out of 9 notches  Right foot with abrasion on proximal second toe d/t severe hallux valgus, foot deformity. Right heel slightly boggy  Neurological:     Mental Status: He is alert.     Comments: Alert and oriented x 3. Normal insight and awareness. Intact Memory. Normal language and speech. Cranial nerve exam unremarkable. MMT: 5/5 UE. RLE grossly 4+ to 5/5. LLE 3/5  prox, limited by pain/vac.  Stocking glove sensory loss RLE below knee, severe below sock line. DTR's trace to absent.   Psychiatric:        Mood and Affect: Mood normal.        Behavior: Behavior normal.    Assessment/Plan: 1. Functional deficits which require 3+ hours per day of interdisciplinary therapy in a comprehensive inpatient rehab setting. Physiatrist is providing close team supervision and 24 hour management of active medical problems listed below. Physiatrist and rehab team continue to assess barriers to discharge/monitor patient progress toward functional and medical goals  Care Tool:  Bathing    Body parts bathed by patient: (P) Right arm   Body parts bathed by helper: Right lower leg     Bathing assist Assist Level: Minimal Assistance - Patient > 75%     Upper Body Dressing/Undressing Upper body dressing   What is the patient wearing?: Pull over shirt    Upper body assist Assist Level: Set up assist    Lower Body Dressing/Undressing Lower body dressing      What is the patient wearing?: Underwear/pull up     Lower body assist Assist for lower body dressing: Moderate Assistance -  Patient 50 - 74%     Toileting Toileting Toileting Activity did not occur Press photographer and hygiene only): N/A (no void or bm)  Toileting assist Assist for toileting: Minimal Assistance - Patient > 75%     Transfers Chair/bed transfer  Transfers assist     Chair/bed transfer assist level: Contact Guard/Touching assist     Locomotion Ambulation   Ambulation assist      Assist level: Contact Guard/Touching assist Assistive device: Walker-rolling Max distance: 25   Walk 10 feet activity   Assist     Assist level: Contact Guard/Touching assist Assistive device: Walker-rolling   Walk 50 feet activity   Assist    Assist level: Minimal Assistance - Patient > 75%      Walk 150 feet activity   Assist Walk 150 feet activity did not occur:  Safety/medical concerns (2/2 limited endurance)         Walk 10 feet on uneven surface  activity   Assist Walk 10 feet on uneven surfaces activity did not occur: Safety/medical concerns (2/2 limited endurance)         Wheelchair     Assist Is the patient using a wheelchair?: Yes Type of Wheelchair: Manual    Wheelchair assist level: Supervision/Verbal cueing Max wheelchair distance: 200    Wheelchair 50 feet with 2 turns activity    Assist        Assist Level: Supervision/Verbal cueing   Wheelchair 150 feet activity     Assist      Assist Level: Supervision/Verbal cueing   Blood pressure 136/82, pulse 86, temperature 97.9 F (36.6 C), resp. rate 20, height 6' (1.829 m), weight 121.8 kg, SpO2 97 %.  Medical Problem List and Plan: 1. Functional deficits secondary to left BKA 04/06/2023 after nonhealing left transmetatarsal amputation of 04/01/2023.  Wound VAC is indicated, will touch base with surgery when this can be removed             -patient may not yet shower             -ELOS/Goals: 11 days, mod I goals with PT, OT           Continue CIR PT and OT  Messaged the team to discuss 2 day extension for patient's ramp to be created, him to continue to feel better regarding ambulation and transfer to bathroom, and for wound care education once vac is removed  Con't CIR PT and OT Pt insistent he not leave until VAC off, but still draining sanguinous drainage-cannot figure out where it was prior or if it's been emptied? At 5th mark and appears to still be actively draining sanguinous drainage at rest.   2.  Impaired mobility: continue Lovenox             -antiplatelet therapy: N/A  3. Pain Management: Oxycodone as needed             -will add low dose gabapentin 100mg  tid for phantom limb pain. Add cymbalta 20mg  daily. Asked Tobi Bastos PT to perform mirror therapy   6/29- pt reports pain 2/10- doing better  6/30- pain controlled- this AM- con't regimen 4.  Insomnia: increase gabapentin to 300mg , improved, continue   6/29- fell asleep when speaking to me- change made last week  6/30- much more awake-  5. Neuropsych/cognition: This patient is capable of making decisions on his own behalf. 6. Wound vac: placed order for removal once there is no longer drainage 7. Fluids/Electrolytes/Nutrition: Routine in and  outs with follow-up chemistries 8.  Acute on chronic anemia.  Follow-up CBC 9.  Hyperglycemia: d/c Juven  10.  Hypertension.  Norvasc 5 mg daily, Toprol-XL 25 mg daily.  Monitor with increased mobility. Increase Magnesium gluconate to 500mg  HS  6/29- BP controlled- con't regimen 11.  Diastolic/systolic congestive heart failure.  Monitor for any signs of fluid overload.  Echocardiogram January 2023 with ejection fraction of 45 to 50% grade 3 diastolic dysfunction. 12.  Morbid Obesity. Due to having DM BMI 37.11.  Dietary follow-up  6/29- BMI down to 36.27 13.  History of alcohol as well as marijuana use.  Provide counseling. Increase magnesium gluconate to 500mg  HS  14.  Hyperlipidemia.  Resolved. Patient is on Lipitor. HDL reviewed and is low, other lipid panel markers are in normal rangge  15. Slow transit constipation:              -Continue senokot-s at bedtime for maintenance             -add psyllium daily  -asked nursing to bring him prune juice   Discussed that constipation has improved, had regular BM without straining on 6/26. D/c miralax.   6/29- LBM yesterday- con't regimen  6/30- LBM x 2 yesterday 16. Screening for vitamin D deficiency: vitamin D reviewed and is normal  17. Tachypnea: incentive spirometer ordered. Placed nursing order to encourage use of incentive spirometer 10/hour while awake to prevent pneumonia and strengthen lungs.    18. Hypotensive: decrease amlodipine to 2.5mg  daily, maintain this dose.   6/29- 6/30- better control  I spent a total of 35   minutes on total care today- >50% coordination of care- due  to  Prolonged time with pt assessing VAC and d/w pt about plan- he is insistent that he not leave until VAC off- cannister has 9 notches- now at 5th notch full of sanguinous drainage.      LOS: 9 days A FACE TO FACE EVALUATION WAS PERFORMED  [name] 04/17/2023, 10:52 AM

## 2023-04-18 ENCOUNTER — Other Ambulatory Visit (HOSPITAL_COMMUNITY): Payer: Self-pay

## 2023-04-18 ENCOUNTER — Telehealth: Payer: Self-pay | Admitting: Orthopedic Surgery

## 2023-04-18 DIAGNOSIS — F4322 Adjustment disorder with anxiety: Secondary | ICD-10-CM

## 2023-04-18 LAB — GLUCOSE, CAPILLARY
Glucose-Capillary: 80 mg/dL (ref 70–99)
Glucose-Capillary: 94 mg/dL (ref 70–99)
Glucose-Capillary: 120 mg/dL — ABNORMAL HIGH (ref 70–99)

## 2023-04-18 LAB — CREATININE, SERUM
Creatinine, Ser: 0.68 mg/dL (ref 0.61–1.24)
GFR, Estimated: >60 mL/min (ref >60)

## 2023-04-18 MED ORDER — ACAMPROSATE CALCIUM 333 MG PO TBEC: 333.0000 mg | DELAYED_RELEASE_TABLET | Freq: Two times a day (BID) | ORAL | 1 refills | Status: DC

## 2023-04-18 MED ORDER — METOPROLOL SUCCINATE ER 25 MG PO TB24
ORAL_TABLET | ORAL | 0 refills | Status: DC
  Filled 2023-04-18: qty 30, 30d supply, fill #0

## 2023-04-18 MED ORDER — AMLODIPINE BESYLATE 2.5 MG PO TABS: 2.5000 mg | ORAL_TABLET | Freq: Every day | ORAL | 0 refills | Status: AC

## 2023-04-18 MED ORDER — ZINC SULFATE 220 (50 ZN) MG PO TABS: 220.0000 mg | ORAL_TABLET | Freq: Every day | ORAL | 0 refills | Status: DC

## 2023-04-18 MED ORDER — GABAPENTIN 300 MG PO CAPS
300.0000 mg | ORAL_CAPSULE | Freq: Three times a day (TID) | ORAL | 0 refills | Status: DC
  Filled 2023-04-18: qty 90, 30d supply, fill #0

## 2023-04-18 MED ORDER — ACETAMINOPHEN 325 MG PO TABS: 650.0000 mg | ORAL_TABLET | Freq: Four times a day (QID) | ORAL | Status: AC | PRN

## 2023-04-18 MED ORDER — AMLODIPINE BESYLATE 5 MG PO TABS
5.0000 mg | ORAL_TABLET | Freq: Every day | ORAL | 0 refills | Status: DC
  Filled 2023-04-18: qty 30, 30d supply, fill #0

## 2023-04-18 MED ORDER — OXYCODONE HCL 5 MG PO TABS: 5.0000 mg | ORAL_TABLET | ORAL | 0 refills | Status: DC | PRN

## 2023-04-18 MED ORDER — ATORVASTATIN CALCIUM 80 MG PO TABS: 80.0000 mg | ORAL_TABLET | Freq: Every day | ORAL | 0 refills | Status: DC

## 2023-04-18 MED ORDER — VITAMIN B-12 100 MCG PO TABS: ORAL_TABLET | ORAL | 1 refills | Status: AC

## 2023-04-18 MED ORDER — PANTOPRAZOLE SODIUM 40 MG PO TBEC: 40.0000 mg | DELAYED_RELEASE_TABLET | Freq: Every day | ORAL | 0 refills | Status: DC

## 2023-04-18 MED ORDER — ASCORBIC ACID 1000 MG PO TABS: 1000.0000 mg | ORAL_TABLET | Freq: Every day | ORAL | 0 refills | Status: AC

## 2023-04-18 MED ORDER — ASCORBIC ACID 1000 MG PO TABS
1000.0000 mg | ORAL_TABLET | Freq: Every day | ORAL | 0 refills | Status: DC
  Filled 2023-04-18: qty 100, 100d supply, fill #0

## 2023-04-18 MED ORDER — FOLIC ACID 1 MG PO TABS
1.0000 mg | ORAL_TABLET | Freq: Every day | ORAL | 0 refills | Status: DC
  Filled 2023-04-18: qty 30, 30d supply, fill #0

## 2023-04-18 MED ORDER — NITROGLYCERIN 0.4 MG SL SUBL: 0.4000 mg | SUBLINGUAL_TABLET | SUBLINGUAL | 1 refills | Status: AC | PRN

## 2023-04-18 MED ORDER — MAGNESIUM 400 MG PO TABS: 400.0000 mg | ORAL_TABLET | Freq: Every day | ORAL | 1 refills | Status: AC

## 2023-04-18 MED ORDER — OXYCODONE HCL 5 MG PO TABS
5.0000 mg | ORAL_TABLET | ORAL | 0 refills | Status: DC | PRN
  Filled 2023-04-18: qty 30, 3d supply, fill #0

## 2023-04-18 MED ORDER — VITAMIN D 25 MCG (1000 UNIT) PO TABS: 2000.0000 [IU] | ORAL_TABLET | Freq: Every day | ORAL | 0 refills | Status: AC

## 2023-04-18 MED ORDER — FOLIC ACID 1 MG PO TABS: 1.0000 mg | ORAL_TABLET | Freq: Every day | ORAL | 0 refills | Status: DC

## 2023-04-18 MED ORDER — GABAPENTIN 300 MG PO CAPS: 300.0000 mg | ORAL_CAPSULE | Freq: Three times a day (TID) | ORAL | 0 refills | Status: DC

## 2023-04-18 MED ORDER — SENNOSIDES-DOCUSATE SODIUM 8.6-50 MG PO TABS: 1.0000 | ORAL_TABLET | Freq: Every day | ORAL | Status: DC

## 2023-04-18 MED ORDER — PANTOPRAZOLE SODIUM 40 MG PO TBEC
40.0000 mg | DELAYED_RELEASE_TABLET | Freq: Every day | ORAL | 0 refills | Status: DC
  Filled 2023-04-18: qty 30, 30d supply, fill #0

## 2023-04-18 MED ORDER — VITAMIN D 25 MCG (1000 UNIT) PO TABS
2000.0000 [IU] | ORAL_TABLET | Freq: Every day | ORAL | 0 refills | Status: DC
  Filled 2023-04-18: qty 180, 90d supply, fill #0

## 2023-04-18 MED ORDER — ZINC SULFATE 220 (50 ZN) MG PO TABS
220.0000 mg | ORAL_TABLET | Freq: Every day | ORAL | 0 refills | Status: DC
  Filled 2023-04-18: qty 100, 100d supply, fill #0

## 2023-04-18 MED ORDER — ATORVASTATIN CALCIUM 80 MG PO TABS
80.0000 mg | ORAL_TABLET | Freq: Every day | ORAL | 0 refills | Status: DC
  Filled 2023-04-18: qty 30, 30d supply, fill #0

## 2023-04-18 MED ORDER — THIAMINE HCL 100 MG PO TABS: 100.0000 mg | ORAL_TABLET | Freq: Every day | ORAL | 1 refills | Status: AC

## 2023-04-18 MED ORDER — DULOXETINE HCL 20 MG PO CPEP
20.0000 mg | ORAL_CAPSULE | Freq: Every day | ORAL | 0 refills | Status: DC
  Filled 2023-04-18: qty 30, 30d supply, fill #0

## 2023-04-18 MED ORDER — DULOXETINE HCL 20 MG PO CPEP: 20.0000 mg | ORAL_CAPSULE | Freq: Every day | ORAL | 0 refills | Status: DC

## 2023-04-18 MED ORDER — METOPROLOL SUCCINATE ER 25 MG PO TB24: 25.0000 mg | ORAL_TABLET | Freq: Every day | ORAL | 0 refills | Status: DC

## 2023-04-18 MED ORDER — FUROSEMIDE 40 MG PO TABS: 40.0000 mg | ORAL_TABLET | Freq: Every day | ORAL | 1 refills | Status: DC | PRN

## 2023-04-18 NOTE — Telephone Encounter (Signed)
PA at [facility] called regarding patient  To inform Dr. Lajoyce Corners that the patient is set to discharge tomorrow but is still having drainage. He does have a wound vac on.  BK was on the 19th. Would like Lajoyce Corners to assess to see if patient needs to stay an extra day or pull the vac. Patient is in room [phone]

## 2023-04-18 NOTE — Progress Notes (Addendum)
Patient ID: [name], male   DOB: 09-26-1961, 62 y.o.   MRN: [id number]    [facility] orders sent to [facility] for SN/PT/OT. Patient has DME (RW and WC) and anticipates purchasing bari TTB and BSC privately.

## 2023-04-18 NOTE — Progress Notes (Signed)
Physical Therapy Weekly Progress Note  Patient Details  Name: [name] MRN: [id number] Date of Birth: 1960/12/28  Beginning of progress report period: April 09, 2023 End of progress report period: April 18, 2023  Patient has met 0 of 7 short term goals. Pt's LOS was extended due to concern for increased incisional drainage and further need to consult regarding wound vac care. Pt is currently able to perform bed mobility with supervision/mod I, transfers with RW and CGA/close supervision, ambulate up to 95ft with RW and CGA, and perform WC mobility 139ft mod I. Pt continues to be limited by pain, decreased standing balance, generalized weakness/deconditioning. Pt's girlfriend has attended family education training and plans to attend more therapy sessions to ensure confidence with D/C home.   Patient continues to demonstrate the following deficits muscle weakness and muscle joint tightness, decreased cardiorespiratoy endurance, and decreased standing balance, decreased postural control, decreased balance strategies, and difficulty maintaining precautions and therefore will continue to benefit from skilled PT intervention to increase functional independence with mobility.  Patient progressing toward long term goals..  Continue plan of care.  PT Short Term Goals Week 1:  PT Short Term Goal 1 (Week 1): STG = LTG 2/2 LOS Week 2:  PT Short Term Goal 1 (Week 2): STG=LTG due to LOS  Skilled Therapeutic Interventions/Progress Updates:  Ambulation/gait training;Balance/vestibular training;Discharge planning;Disease management/prevention;DME/adaptive equipment instruction;Functional mobility training;Patient/family education;Pain management;Psychosocial support;Therapeutic Exercise;Therapeutic Activities;Stair training;UE/LE Strength taining/ROM;UE/LE Coordination activities;Wheelchair propulsion/positioning   Therapy Documentation Precautions:  Precautions Precautions: Fall, Knee Precaution Comments:  Reviewed BKA precautions Required Braces or Orthoses: Other Brace Other Brace: Ampu-shield Restrictions Weight Bearing Restrictions: Yes LLE Weight Bearing: Non weight bearing   Therapy/Group: Individual Therapy [name] [name] PT, DPT 04/18/2023, 12:20 PM
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
## 2023-04-18 NOTE — Telephone Encounter (Signed)
Sent message to Denny Peon for when she does her rounds in the morning at hospital on behalf of Dr. Lajoyce Corners being out this week.

## 2023-04-18 NOTE — Progress Notes (Signed)
Patient ID: [name], male   DOB: 1961/05/17, 62 y.o.   MRN: [id number]   Bedside Commode ordered through [employer].

## 2023-04-18 NOTE — Progress Notes (Signed)
Occupational Therapy Weekly Progress Note  Patient Details  Name: [name] MRN: [id number] Date of Birth: 25-Aug-1961  Beginning of progress report period: April 09, 2023 End of progress report period: April 18, 2023  Patient has met 4 of 7 LTGs. Pt is making steady progress towards LTGs. He is able to sponge bathe at an overall min A level (due to wound vac still in place), dress with supervision at bed level and requires CGA assist for toileting tasks. Pt continues to demonstrate difficulty with powering up from low surfaces especially without push up rails, standing balance, gross motor coordination and global endurance resulting in difficulty completing BADL tasks without increased physical assist. Pt will benefit from continued skilled OT services to focus on mentioned deficits. Girlfriend has been present for family education for 2 sessions, however original DC date extended from 7/2 to 7/4 to allow ramp installation, and wound care education after wound vac removal prior to DC.   Patient continues to demonstrate the following deficits: muscle weakness, decreased cardiorespiratoy endurance, decreased coordination, and decreased standing balance and therefore will continue to benefit from skilled OT intervention to enhance overall performance with BADL, iADL, and Reduce care partner burden.  Patient progressing toward long term goals..  Continue plan of care.  OT Short Term Goals Week 1:  OT Short Term Goal 1 (Week 1): STGs= LTGs due to short ELOS Week 2:  OT Short Term Goal 1 (Week 2): Continue progressing towards LTGs (DC date extended therefore no STG previously made)    [name], MS, OTR/L  04/18/2023, 7:50 AM

## 2023-04-18 NOTE — Progress Notes (Signed)
Occupational Therapy Session Note  Patient Details  Name: [name] MRN: [id number] Date of Birth: 08/05/1961  Today's Date: 04/18/2023 OT Individual Time: 0902-1000 & 9562-1308 OT Individual Time Calculation (min): 58 min & 59 min   Short Term Goals: Week 1:  OT Short Term Goal 1 (Week 1): STGs= LTGs due to short ELOS  Skilled Therapeutic Interventions/Progress Updates:  Session 1 Skilled OT intervention completed with focus on DC planning, wound care education, dynamic balance with unilateral UE support for simulated toileting needs. Pt received upright in bed, agreeable to session. No pain reported.  Pt expressed anxiousness and frustration about wound vac still being in place as he feels that therapy extended his DC to allow wound care education, and he and his girlfriend are both sensitive to wounds and would prefer not to receive last minute education on day of DC. OT communicated to care team pt's concerns, to determine plan for wound vac removal (if happening) otherwise education will need to occur on wound vac management with mobility. OT provided education on simulated wound care that could be done in PM session with girlfriend and recommended accepting the continued placement of wound vac if stated by vascular for continued draining for healing purpose. Per pt request, CSW notified of preference for home wound care if applicable.   Donned limb guard with mod I, shoe with set up A. Transitioned to EOB with mod I. Pt elevated bed to high level; reports having bed at home at same height. CGA sit > stand using RW, then CGA stand pivot to w/c. Transported dependently in w/c for time <> gym.   Threaded yellow theraband over both thighs, then close supervision sit > stand using RW. To simulate dynamic balance with unilateral UE support on RW during toileting steps, pt donned theraband from thighs <> abdomen 2 trials. Required CGA for standing balance using RW, with cues needed to avoid  rushing for the sake of preventing imbalance as when pt rushed he was less efficient with donning and balance. No LOB however. Intermittent seated rest break needed for fatigue. Back in room, pt remained seated in w/c, with all needs in reach at end of session.  Session 2 Skilled OT intervention completed with focus on family education with girlfriend present regarding toilet transfers, accessibility, and limb care education. Pt received upright in bed, agreeable to session. No pain reported.  Education/demo provided on the following during session: -Goals of mod I for sit > stand and stand pivot with RW to [facility] at bedside -Discussed putting BSC against wall/night stand for stability with descending and ascending -Limb guard already donned, then mod I transition to EOB, close supervision sit > stand from elevated bed height (has high level king bed at home) using RW then CGA stand pivot with RW to [facility] -Stood with CGA using RW then simulated doff pants down and redonning with alternating hand placement on RW for balance -Stand pivot with CGA back to EOB -Reviewed needed supervision for step hop ambulation therefore when home alone will need to plan for BSC to be in area where he can transfer via stand pivot with mod I -Girlfriend brought measurements of doorway frame <> frame (24") & frame to corner of door (21"). Discussed how RW in room is too wide with current set up but if switched legs with wheels to inside, it would accommodate the 24" width to allow forwards vs side entry into bathroom doorway however side could be back up option -Grabbed supplies for  wound care to simulate items that would be used and in correct order (non adherent pad, abdominal pad if bleeding a lot, shrinker, then kerlix for additional blood)  Supervision sit > stand using RW and CGA stand pivot to w/c. Pt remained seated in w/c, with girlfriend present awaiting prompt PT session, with all needs in reach at end of  session.   Therapy Documentation Precautions:  Precautions Precautions: Fall, Knee Precaution Comments: Reviewed BKA precautions Required Braces or Orthoses: Other Brace Other Brace: Ampu-shield Restrictions Weight Bearing Restrictions: Yes LLE Weight Bearing: Non weight bearing    Therapy/Group: Individual Therapy  [name], MS, OTR/L  04/18/2023, 3:10 PM

## 2023-04-18 NOTE — Progress Notes (Signed)
Orthopedic Tech Progress Note Patient Details:  Bhavya Suver Feb 19, 1961 [id number]  Called in order to [employer] for 4XL BKA SHRINKERS   Patient ID: Tegen Landau, male   DOB: 05/24/1961, 62 y.o.   MRN: [id number]  Donald Pore 04/18/2023, 3:20 PM

## 2023-04-18 NOTE — Progress Notes (Signed)
Physical Therapy Session Note  Patient Details  Name: [name] MRN: [id number] Date of Birth: 1961-07-27  Today's Date: 04/18/2023 PT Individual Time: 7829-5621 and 1430-1456 PT Individual Time Calculation (min): 70 min and 26 min  Short Term Goals: Week 1:  PT Short Term Goal 1 (Week 1): STG = LTG 2/2 LOS  Skilled Therapeutic Interventions/Progress Updates:   Treatment Session 1 Received pt sitting in WC, pt agreeable to PT treatment, and reported pain was "good" (premedicated) but concerned about when wound vac will be removed - treatment team aware. Session with emphasis on functional mobility/transfers, generalized strengthening and endurance, dynamic standing balance/coordination, and ambulation.   Pt performed WC mobility 149ft using BUE and supervision/mod I to main therapy gym. Pt able to don/doff legrests without assist and transferred on/off mat via stand<>pivot with RW and CGA/close supervision. Pt requested to work on exercises rather than "hopping" this morning. Pt performed the following exercises with emphasis on LE/UE/core strength and ROM: -modified crunches on wedge x10, progressing to x10 with 4.4lb medicine ball -modified crunches on wedge with ball toss on rebounder x 20 reps -seated lateral rotations with 4.4lb medicine ball x20 bilaterally -seated tricep extensions on yoga blocks 3x8 Pt transferred sit<>supine on wedge with supervision and continued with the following exercises: -supine single leg bridges 2x10 -supine L hip abduction 2x10 -R sidelying L hip abduction 2x10 -R sidelying L hip extension 2x10 Pt transferred R sideling<>sitting EOM with supervision and performed blocked practice sit<>stands x5 with RW and CGA fading to supervision. Pt reports his biggest concern is getting into the bathroom, hopping sideways - therefore practiced and pt hopped ~89ft to R with RW and CGA and backed up to [facility] to sit to simulate bathroom entry. Pt also reported having  standard walker not RW at home - requested RW from CSW. Pt transported back to room in [facility] dependently and requested to return to bed. Pt transferred WC<>bed stand<>pivot with RW and CGA and concluded session with pt sitting EOB with all needs within reach.   Treatment Session 2 Received pt sitting in [facility] with girlfriend present at bedside. Pt agreeable to PT treatment and did not state pain level but frustrated with still not knowing current status of wound vac - reassured pt that ortho has been consulted and awaiting response. Session with emphasis on limb care and dressing changes. Educated pt/girlfriend on shrinker wear and care schedule (hand washing and air drying) and healing properties of shrinker. Discussed importance of placing shrinker directly on incision, but making sure than when shrinker is removed, that no skin is taken with it if draining heavily. Instructed pt/girlfriend to place nonadherent and abdominal pads on top of shrinker and wrap gauze around if drainage continues through. Therapist demonstrated how to wrap limb using limb protector to simulate residual limb. Also reached out to MD to order more 4XL shrinkers. Concluded session with pt sitting in [facility] with all needs within reach and girlfriend and RN present at bedside.   Therapy Documentation Precautions:  Precautions Precautions: Fall, Knee Precaution Comments: Reviewed BKA precautions Required Braces or Orthoses: Other Brace Other Brace: Ampu-shield Restrictions Weight Bearing Restrictions: Yes LLE Weight Bearing: Non weight bearing  Therapy/Group: Individual Therapy [name] [name] PT, DPT 04/18/2023, 7:12 AM

## 2023-04-18 NOTE — Progress Notes (Addendum)
Patient ID: [name], male   DOB: 06/07/1961, 62 y.o.   MRN: [id number]  Sw left VM with patient S.O, [name] to discuss extension and also recommendations for d/c. SW will wait for follow up.   11:33 AM: Sw spoke with patient S.O to inform her patient has been extended until 7/4. Team waiting on updates from ortho on determination of wound vac. Patient will d/c home with SN/PT/OT/SLP orders. S.O will be present today. No additional questions or concerns.

## 2023-04-18 NOTE — Progress Notes (Signed)
PROGRESS NOTE   Subjective/Complaints: Consulting ortho today whether wound vac can be removed as it is still draining, discharge has been extended to Thursday Patient is meeting with neuropsych today  ROS:   Pt denies SOB, abd pain, CP, N/V/C/D, and vision changes   Except for HPI Objective:   No results found. No results for input(s): "WBC", "HGB", "HCT", "PLT" in the last 72 hours.   Recent Labs    04/18/23 0613  CREATININE 0.68     Intake/Output Summary (Last 24 hours) at 04/18/2023 1210 Last data filed at 04/18/2023 0700 Gross per 24 hour  Intake 118 ml  Output 800 ml  Net -682 ml        Physical Exam: Vital Signs Blood pressure 106/60, pulse 84, temperature 98.1 F (36.7 C), resp. rate 16, height 6' (1.829 m), weight 121.8 kg, SpO2 95 %.  General: awake, alert, appropriate, sitting up in bed; NAD, BMI 36.42 HENT: conjugate gaze; oropharynx moist CV: regular rate and rhythm; no JVD Pulmonary: CTA B/L; no W/R/R- good air movement GI: soft, NT, ND, (+)BS Psychiatric: appropriate- interactive Neurological: Ox3 Much more awake, alert today Extremities: wound vac still draining Musculoskeletal: limb guard and shrinker/VAC in place      General: Swelling and tenderness present. Normal range of motion.     Cervical back: Normal range of motion.  Skin:    Comments: Left BKA with wound VAC in place. 1/2 full cannister on VAC with sanguinous drainage- up slightly to 5th mark out of 9 notches  Right foot with abrasion on proximal second toe d/t severe hallux valgus, foot deformity. Right heel slightly boggy  Neurological:     Mental Status: He is alert.     Comments: Alert and oriented x 3. Normal insight and awareness. Intact Memory. Normal language and speech. Cranial nerve exam unremarkable. MMT: 5/5 UE. RLE grossly 4+ to 5/5. LLE 3/5 prox, limited by pain/vac.  Stocking glove sensory loss RLE below knee,  severe below sock line. DTR's trace to absent.   Psychiatric:        Mood and Affect: Mood normal.        Behavior: Behavior normal.    Assessment/Plan: 1. Functional deficits which require 3+ hours per day of interdisciplinary therapy in a comprehensive inpatient rehab setting. Physiatrist is providing close team supervision and 24 hour management of active medical problems listed below. Physiatrist and rehab team continue to assess barriers to discharge/monitor patient progress toward functional and medical goals  Care Tool:  Bathing    Body parts bathed by patient: Right arm   Body parts bathed by helper: Right lower leg     Bathing assist Assist Level: Minimal Assistance - Patient > 75%     Upper Body Dressing/Undressing Upper body dressing   What is the patient wearing?: Pull over shirt    Upper body assist Assist Level: Set up assist    Lower Body Dressing/Undressing Lower body dressing      What is the patient wearing?: Underwear/pull up     Lower body assist Assist for lower body dressing: Moderate Assistance - Patient 50 - 74%     Financial trader Activity  did not occur Press photographer and hygiene only): N/A (no void or bm)  Toileting assist Assist for toileting: Minimal Assistance - Patient > 75%     Transfers Chair/bed transfer  Transfers assist     Chair/bed transfer assist level: Contact Guard/Touching assist     Locomotion Ambulation   Ambulation assist      Assist level: Contact Guard/Touching assist Assistive device: Walker-rolling Max distance: 25   Walk 10 feet activity   Assist     Assist level: Contact Guard/Touching assist Assistive device: Walker-rolling   Walk 50 feet activity   Assist    Assist level: Minimal Assistance - Patient > 75%      Walk 150 feet activity   Assist Walk 150 feet activity did not occur: Safety/medical concerns (2/2 limited endurance)         Walk 10 feet on  uneven surface  activity   Assist Walk 10 feet on uneven surfaces activity did not occur: Safety/medical concerns (2/2 limited endurance)         Wheelchair     Assist Is the patient using a wheelchair?: Yes Type of Wheelchair: Manual    Wheelchair assist level: Independent Max wheelchair distance: 169ft    Wheelchair 50 feet with 2 turns activity    Assist        Assist Level: Independent   Wheelchair 150 feet activity     Assist      Assist Level: Independent   Blood pressure 106/60, pulse 84, temperature 98.1 F (36.7 C), resp. rate 16, height 6' (1.829 m), weight 121.8 kg, SpO2 95 %.  Medical Problem List and Plan: 1. Functional deficits secondary to left BKA 04/06/2023 after nonhealing left transmetatarsal amputation of 04/01/2023.  Wound VAC is indicated, will touch base with surgery when this can be removed             -patient may not yet shower             -ELOS/Goals: 11 days, mod I goals with PT, OT           Continue CIR PT and OT  Messaged the team to discuss 2 day extension for patient's ramp to be created, him to continue to feel better regarding ambulation and transfer to bathroom, and for wound care education once vac is removed  Con't CIR PT and OT Pt insistent he not leave until VAC off, but still draining sanguinous drainage-cannot figure out where it was prior or if it's been emptied? At 5th mark and appears to still be actively draining sanguinous drainage at rest.   2.  Impaired mobility: continue Lovenox             -antiplatelet therapy: N/A  3. Pain Management: Oxycodone as needed             -will add low dose gabapentin 100mg  tid for phantom limb pain. Add cymbalta 20mg  daily. Asked Tobi Bastos PT to perform mirror therapy   6/29- pt reports pain 2/10- doing better  6/30- pain controlled- this AM- con't regimen 4. Insomnia: increase gabapentin to 300mg , improved, continue   6/29- fell asleep when speaking to me- change made last  week  6/30- much more awake-  5. Neuropsych/cognition: This patient is capable of making decisions on his own behalf. 6. Wound vac: placed order for removal once there is no longer drainage 7. Fluids/Electrolytes/Nutrition: Routine in and outs with follow-up chemistries 8.  Acute on chronic anemia.  Follow-up CBC 9.  Hyperglycemia: d/c Juven  10.  Hypertension.  Norvasc 5 mg daily, Toprol-XL 25 mg daily.  Monitor with increased mobility. Increase Magnesium gluconate to 500mg  HS  6/29- BP controlled- con't regimen 11.  Diastolic/systolic congestive heart failure.  Monitor for any signs of fluid overload.  Echocardiogram January 2023 with ejection fraction of 45 to 50% grade 3 diastolic dysfunction.  12.  Morbid Obesity. BMI reviewed and is improving  13.  History of alcohol as well as marijuana use.  Provide counseling. Continue magnesium gluconate to 500mg  HS  14.  Hyperlipidemia.  Resolved. Patient is on Lipitor. HDL reviewed and is low, other lipid panel markers are in normal range  15. Slow transit constipation:              -Continue senokot-s at bedtime for maintenance             -add psyllium daily  -asked nursing to bring him prune juice   Discussed that constipation has improved D/c miralax.   Continue magnesium gluconate 500mg  HS  16. Screening for vitamin D deficiency: vitamin D reviewed and is normal  17. Tachypnea: incentive spirometer ordered. Placed nursing order to encourage use of incentive spirometer 10/hour while awake to prevent pneumonia and strengthen lungs.    18. Hypotensive: d/c amlodipine       LOS: 10 days A FACE TO FACE EVALUATION WAS PERFORMED  Clint Bolder P Becky Berberian 04/18/2023, 12:10 PM

## 2023-04-19 LAB — GLUCOSE, CAPILLARY
Glucose-Capillary: 120 mg/dL — ABNORMAL HIGH (ref 70–99)
Glucose-Capillary: 115 mg/dL — ABNORMAL HIGH (ref 70–99)
Glucose-Capillary: 88 mg/dL (ref 70–99)
Glucose-Capillary: 95 mg/dL (ref 70–99)

## 2023-04-19 MED ORDER — DOXYCYCLINE HYCLATE 100 MG PO TABS
100.0000 mg | ORAL_TABLET | Freq: Two times a day (BID) | ORAL | Status: DC
  Administered 2023-04-19 – 2023-04-21 (×4): 100 mg via ORAL
  Filled 2023-04-19 (×4): qty 1

## 2023-04-19 NOTE — Progress Notes (Addendum)
Patient upset stating that someone came in and removed wound vac without telling him any additional information and left within a few minutes and he wants to speak to a doctor, states he is leaving a message for Dr. Carlis Abbott concerning the matter. This nurse unable to find a note to address patient's concern, charge nurse notified.

## 2023-04-19 NOTE — Progress Notes (Signed)
PROGRESS NOTE   Subjective/Complaints: Messaged ortho to request eval for wound vac removal, patient is hopeful to have this removed so that we may initiate wound care education  ROS:   Pt denies SOB, abd pain, CP, N/V/C/D, and vision changes, phantom limb pain is well controlled  Objective:   No results found. No results for input(s): "WBC", "HGB", "HCT", "PLT" in the last 72 hours.   Recent Labs    04/18/23 0613  CREATININE 0.68     Intake/Output Summary (Last 24 hours) at 04/19/2023 1132 Last data filed at 04/19/2023 0830 Gross per 24 hour  Intake 357 ml  Output 2600 ml  Net -2243 ml        Physical Exam: Vital Signs Blood pressure 120/71, pulse 74, temperature 98.6 F (37 C), temperature source Oral, resp. rate 18, height 6' (1.829 m), weight 121.9 kg, SpO2 100 %.  General: awake, alert, appropriate, sitting up in bed; NAD, BMI 36.45 HENT: conjugate gaze; oropharynx moist CV: regular rate and rhythm; no JVD Pulmonary: CTA B/L; no W/R/R- good air movement GI: soft, NT, ND, (+)BS Psychiatric: appropriate- interactive Neurological: Ox3 Much more awake, alert today Extremities: wound vac still draining Musculoskeletal: limb guard and shrinker/VAC in place      General: Swelling and tenderness present. Normal range of motion.     Cervical back: Normal range of motion.  Skin:    Comments: Left BKA with wound VAC in place. 1/2 full cannister on VAC with sanguinous drainage- up slightly to 5th mark out of 9 notches  Right foot with abrasion on proximal second toe d/t severe hallux valgus, foot deformity. Right heel slightly boggy  Neurological:     Mental Status: He is alert.     Comments: Alert and oriented x 3. Normal insight and awareness. Intact Memory. Normal language and speech. Cranial nerve exam unremarkable. MMT: 5/5 UE. RLE grossly 4+ to 5/5. LLE 3/5 prox, limited by pain/vac.  Stocking glove sensory  loss RLE below knee, severe below sock line. DTR's trace to absent.   Psychiatric:        Mood and Affect: Mood normal.        Behavior: Behavior normal.  GU: continent of bowel   Assessment/Plan: 1. Functional deficits which require 3+ hours per day of interdisciplinary therapy in a comprehensive inpatient rehab setting. Physiatrist is providing close team supervision and 24 hour management of active medical problems listed below. Physiatrist and rehab team continue to assess barriers to discharge/monitor patient progress toward functional and medical goals  Care Tool:  Bathing    Body parts bathed by patient: Right arm   Body parts bathed by helper: Right lower leg     Bathing assist Assist Level: Minimal Assistance - Patient > 75%     Upper Body Dressing/Undressing Upper body dressing   What is the patient wearing?: Pull over shirt    Upper body assist Assist Level: Set up assist    Lower Body Dressing/Undressing Lower body dressing      What is the patient wearing?: Underwear/pull up     Lower body assist Assist for lower body dressing: Moderate Assistance - Patient 50 - 74%  Toileting Toileting Toileting Activity did not occur Press photographer and hygiene only): N/A (no void or bm)  Toileting assist Assist for toileting: Contact Guard/Touching assist     Transfers Chair/bed transfer  Transfers assist     Chair/bed transfer assist level: Contact Guard/Touching assist     Locomotion Ambulation   Ambulation assist      Assist level: Contact Guard/Touching assist Assistive device: Walker-rolling Max distance: 25   Walk 10 feet activity   Assist     Assist level: Contact Guard/Touching assist Assistive device: Walker-rolling   Walk 50 feet activity   Assist    Assist level: Minimal Assistance - Patient > 75%      Walk 150 feet activity   Assist Walk 150 feet activity did not occur: Safety/medical concerns (2/2 limited  endurance)         Walk 10 feet on uneven surface  activity   Assist Walk 10 feet on uneven surfaces activity did not occur: Safety/medical concerns (2/2 limited endurance)         Wheelchair     Assist Is the patient using a wheelchair?: Yes Type of Wheelchair: Manual    Wheelchair assist level: Independent Max wheelchair distance: 174ft    Wheelchair 50 feet with 2 turns activity    Assist        Assist Level: Independent   Wheelchair 150 feet activity     Assist      Assist Level: Independent   Blood pressure 120/71, pulse 74, temperature 98.6 F (37 C), temperature source Oral, resp. rate 18, height 6' (1.829 m), weight 121.9 kg, SpO2 100 %.  Medical Problem List and Plan: 1. Functional deficits secondary to left BKA 04/06/2023 after nonhealing left transmetatarsal amputation of 04/01/2023.  Wound VAC is indicated, will touch base with surgery when this can be removed             -patient may not yet shower             -ELOS/Goals: 11 days, mod I goals with PT, OT           Continue CIR PT and OT  D/c extended to thursday  Con't CIR PT and OT  Messaged ortho to eval for wound vac removal   2.  Impaired mobility: continue Lovenox             -antiplatelet therapy: N/A  3. Pain Management: Oxycodone as needed             -Gabapentin increased to 300mg  TID. Add cymbalta 20mg  daily. Asked Tobi Bastos PT to perform mirror therapy   4. Insomnia: increase gabapentin to 300mg , improved, continue  5. Neuropsych/cognition: This patient is capable of making decisions on his own behalf. 6. Wound vac: placed order for removal once there is no longer drainage 7. Fluids/Electrolytes/Nutrition: Routine in and outs with follow-up chemistries 8.  Acute on chronic anemia.  Follow-up CBC 9.  Hyperglycemia: d/c Juven  10.  Hypertension.  Norvasc 5 mg daily, Toprol-XL 25 mg daily.  Monitor with increased mobility. Increase Magnesium gluconate to 500mg  HS  6/29- BP  controlled- con't regimen 11.  Diastolic/systolic congestive heart failure.  Monitor for any signs of fluid overload.  Echocardiogram January 2023 with ejection fraction of 45 to 50% grade 3 diastolic dysfunction.  12.  Morbid Obesity. BMI reviewed and is improving  13.  History of alcohol as well as marijuana use.  Provide counseling. Continue magnesium gluconate to 500mg  HS  14.  Hyperlipidemia.  Resolved. Patient is on Lipitor. HDL reviewed and is low, other lipid panel markers are in normal range  15. Slow transit constipation:              -Continue senokot-s at bedtime for maintenance             -add psyllium daily  -asked nursing to bring him prune juice   Discussed that constipation has improved D/c miralax.   Continue magnesium gluconate 500mg  HS  16. Screening for vitamin D deficiency: vitamin D reviewed and is normal  17. Tachypnea: incentive spirometer ordered. Placed nursing order to encourage use of incentive spirometer 10/hour while awake to prevent pneumonia and strengthen lungs.    18. Hypotensive: d/c amlodipine       LOS: 11 days A FACE TO FACE EVALUATION WAS PERFORMED  [name] P [name] 04/19/2023, 11:32 AM

## 2023-04-19 NOTE — Progress Notes (Signed)
Physical Therapy Session Note  Patient Details  Name: [name] MRN: [id number] Date of Birth: 30-Aug-1961  Today's Date: 04/19/2023 PT Individual Time: 1115-1155 PT Individual Time Calculation (min): 40 min   Short Term Goals: Week 2:  PT Short Term Goal 1 (Week 2): STG=LTG due to LOS  Skilled Therapeutic Interventions/Progress Updates:    Chart reviewed and pt agreeable to therapy. Pt received seated in WC with no c/o pain. Session focused on d/c planning, WC propulsion, and functional transfers to prepare for safe home access and mobility. Pt initiated session with discussion with PT regarding ramp preparation. Pt verbalized that ramp is being installed today and PT and pt discussed safe grade, which pt confirmed, and use of ramp, noting that family will help when needed 2/2 fatigue. Pt then completed WC propulsion of 231ft with ModI and PT managing wound vac. Pt noted to need increased time 2/2 WC settings. Pt also able to demonstrate correct use of WC parts. Pt then transferred to bed with SPT and sit>supine all with S + RW. At end of session, pt was left semi-reclined in bed with alarm engaged, nurse call bell and all needs in reach.     Therapy Documentation Precautions:  Precautions Precautions: Fall, Knee Precaution Comments: Reviewed BKA precautions Required Braces or Orthoses: Other Brace Other Brace: Ampu-shield Restrictions Weight Bearing Restrictions: Yes LLE Weight Bearing: Non weight bearing General:      Therapy/Group: Individual Therapy  Dionne Milo, PT, DPT 04/19/2023, 12:03 PM

## 2023-04-19 NOTE — Progress Notes (Signed)
RN went to patient's room to address concern re: removal of wound vac on left bka and same time Dr. Carlis Abbott was on the phone with the patient.RN spoke with Dr. Carlis Abbott. Wound was redressed to take pictures of the incision site per Dr. Carlis Abbott. Patient was educated re: dressing changes.

## 2023-04-19 NOTE — Progress Notes (Signed)
Occupational Therapy Session Note  Patient Details  Name: [name] MRN: [id number] Date of Birth: 09/11/1961  Today's Date: 04/19/2023 OT Individual Time: 1020-1100 & 1435-1530 OT Individual Time Calculation (min): 40 min & 55 min   Short Term Goals: Week 2:  OT Short Term Goal 1 (Week 2): Continue progressing towards LTGs (DC date extended therefore no STG previously made)  Skilled Therapeutic Interventions/Progress Updates:  Session 1 Skilled OT intervention completed with focus on dynamic standing balance with unilateral UE support, AROM BUE. Pt received seated in w/c, agreeable to session. 5/10 pain reported in Rt shoulder; pt declined pain meds. OT offered rest breaks, repositioning throughout for pain reduction.  Pt declined self-care needs. Self-propelled in w/c about 15 ft with mod I, however OT transported dependently in w/c rest of way to gym for time. Upon positioning of wound vac for activity, wound vac noted to be off/dead battery. Pt indicated it was on this AM during first therapy; OT retrieved power source and restarted the negative pressure therapy per protocol.  Close supervision sit > stand using RW, then placed squigz on long mirror in standing using RW with close supervision increasing to CGA with fatigue for balance to promote dynamic balance needed for BADLs. Removed squigz for AROM from seated position. Pt needed to use modified technique of off loading R elbow with L hand due to pain associated with RC tear on R shoulder. Repeated the activity except with L hand in stance, requiring CGA consistently and intermittent standing rest break due to RLE fatigue. No difficulty removing squigz in seated position.  Transported dependently in w/c back to room. Pt remained seated in w/c, with all needs in reach at end of session, wound vac plugged into battery source.  Session 2 Skilled OT intervention completed with focus on functional transfers, amputee stretches and  contracture prevention. Pt received upright in bed, agreeable to session. No pain reported.  Pt declined self-care needs. Transitioned to EOB with mod I, limb guard already on. Donned shoe with set up A. Supervision sit > stand using RW from elevated bed height, then close supervision stand pivot using RW to w/c. Transported pt dependently in w/c <> gym for time.  Supervision sit > stand and stand pivot with RW to EOM. Doffed limb guard independently, then transitioned to supine with mod I. Pillow provided for comfort.  Pt completed the following exercises to address Lt residual limb strengthening and contracture prevention needed for proper wear and biomechanics of a prosthetic: Supine -knee extension with limb on foam block for greater extension, 5 sec hold x10 Rt sidelying -hip abduction x10 -Circumduction clockwise x10 -Circumduction counterclockwise x10 Prone -hip extension, 3 sec hold x5, with cues needed for stabilization at hip -residual limb knee flexion& extension x10 (HEP issued to pt on the above and other exercises at bed level)   Transitioned prone > Rt side lying > supine > EOM with mod I. Redonned limb guard total A for time, Min A sit > stand from low surface using RW, then supervision stand pivot to w/c. Back in room, pt completed supervision sit > stand and stand pivot with RW to EOB, mod I bed mobility to upright in bed. Pt remained in bed, with bed alarm on/activated, and with all needs in reach at end of session.  Therapy Documentation Precautions:  Precautions Precautions: Fall, Knee Precaution Comments: Reviewed BKA precautions Required Braces or Orthoses: Other Brace Other Brace: Ampu-shield Restrictions Weight Bearing Restrictions: Yes LLE Weight Bearing: Non  weight bearing    Therapy/Group: Individual Therapy  [name], MS, OTR/L  04/19/2023, 3:36 PM

## 2023-04-19 NOTE — Progress Notes (Signed)
Physical Therapy Session Note  Patient Details  Name: [name] MRN: [id number] Date of Birth: 1961/09/26  Today's Date: 04/19/2023 PT Individual Time: 0830-0926 PT Individual Time Calculation (min): 56 min   Short Term Goals: Week 1:  PT Short Term Goal 1 (Week 1): STG = LTG 2/2 LOS Week 2:  PT Short Term Goal 1 (Week 2): STG=LTG due to LOS  Skilled Therapeutic Interventions/Progress Updates:   Received pt sitting on bedside commode - handoff from RN. Pt agreeable to PT treatment and reported pain 1/10 in L residual limb (premedicated). Session with emphasis on toileting, discharge planning, functional mobility/transfers, generalized strengthening and endurance, and WC mobility. With increased time, pt continent of bowel (provided pt with therapy schedule while working on BM as pt did not receive one). Donned limb guard with mod A for time purposes and pt performed hygiene management with set up assist and pt pulled underwear/shorts over hips without assist via lateral leans - attempted to stand to pull pants over hips remainder of way, however pt losing balance ultimately requiring max A from therapist to pull pants over hips. Pt transferred bedside commode<>WC via stand<>pivot with RW and CGA/close supervision and sat in WC at sink and washed hands independently. RN present to administer medications and pt received phone calls from ramp company during session. Went through sensation, MMT, and pain interference questionnaire in preparation for discharge. Pt performed WC mobility 193ft using BUE and mod I to dayroom with emphasis on UE strength. Pt then performed seated R knee extension x15 and R hip flexion x15 with 3lb ankle weight with emphasis on LE strength/ROM. Transported back to room and pt left in hallway while EVS cleaned room, safe and capable of propelling himself back into room independently. NT aware of pt's current status.  Therapy Documentation Precautions:   Precautions Precautions: Fall, Knee Precaution Comments: Reviewed BKA precautions Required Braces or Orthoses: Other Brace Other Brace: Ampu-shield Restrictions Weight Bearing Restrictions: Yes LLE Weight Bearing: Non weight bearing  Therapy/Group: Individual Therapy [name] [name] PT, DPT 04/19/2023, 6:58 AM

## 2023-04-20 LAB — GLUCOSE, CAPILLARY
Glucose-Capillary: 94 mg/dL (ref 70–99)
Glucose-Capillary: 82 mg/dL (ref 70–99)
Glucose-Capillary: 113 mg/dL — ABNORMAL HIGH (ref 70–99)
Glucose-Capillary: 107 mg/dL — ABNORMAL HIGH (ref 70–99)

## 2023-04-20 MED ORDER — DOXYCYCLINE HYCLATE 100 MG PO TABS: 100.0000 mg | ORAL_TABLET | Freq: Two times a day (BID) | ORAL | 0 refills | Status: DC

## 2023-04-20 NOTE — Progress Notes (Signed)
PROGRESS NOTE   Subjective/Complaints: Messaged ortho to request eval for wound vac removal, patient is hopeful to have this removed so that we may initiate wound care education  ROS:   Pt denies SOB, abd pain, CP, N/V/C/D, and vision changes, phantom limb pain is well controlled  Objective:   No results found. No results for input(s): "WBC", "HGB", "HCT", "PLT" in the last 72 hours.   Recent Labs    04/18/23 0613  CREATININE 0.68     Intake/Output Summary (Last 24 hours) at 04/20/2023 1027 Last data filed at 04/20/2023 0813 Gross per 24 hour  Intake 956 ml  Output 2700 ml  Net -1744 ml        Physical Exam: Vital Signs Blood pressure 138/83, pulse 74, temperature (!) 97.5 F (36.4 C), temperature source Oral, resp. rate 18, height 6' (1.829 m), weight 120.9 kg, SpO2 96 %.  General: awake, alert, appropriate, sitting up in bed; NAD, BMI 36.45 HENT: conjugate gaze; oropharynx moist CV: regular rate and rhythm; no JVD Pulmonary: CTA B/L; no W/R/R- good air movement GI: soft, NT, ND, (+)BS Psychiatric: appropriate- interactive Neurological: Ox3 Much more awake, alert today Extremities: wound vac still draining Musculoskeletal: limb guard and shrinker/VAC in place      General: Swelling and tenderness present. Normal range of motion.     Cervical back: Normal range of motion.  Skin:    Comments: Left BKA with wound VAC in place. 1/2 full cannister on VAC with sanguinous drainage- up slightly to 5th mark out of 9 notches  Right foot with abrasion on proximal second toe d/t severe hallux valgus, foot deformity. Right heel slightly boggy  Neurological:     Mental Status: He is alert.     Comments: Alert and oriented x 3. Normal insight and awareness. Intact Memory. Normal language and speech. Cranial nerve exam unremarkable. MMT: 5/5 UE. RLE grossly 4+ to 5/5. LLE 3/5 prox, limited by pain/vac.  Stocking glove  sensory loss RLE below knee, severe below sock line. DTR's trace to absent.   Psychiatric:        Mood and Affect: Mood normal.        Behavior: Behavior normal.  GU: continent of bowel   Assessment/Plan: 1. Functional deficits which require 3+ hours per day of interdisciplinary therapy in a comprehensive inpatient rehab setting. Physiatrist is providing close team supervision and 24 hour management of active medical problems listed below. Physiatrist and rehab team continue to assess barriers to discharge/monitor patient progress toward functional and medical goals  Care Tool:  Bathing    Body parts bathed by patient: Right arm, Left arm, Chest, Abdomen, Front perineal area, Buttocks, Right upper leg, Left upper leg, Left lower leg, Face   Body parts bathed by helper: Right lower leg Body parts n/a: Left lower leg   Bathing assist Assist Level: Independent with assistive device     Upper Body Dressing/Undressing Upper body dressing   What is the patient wearing?: Pull over shirt    Upper body assist Assist Level: Independent    Lower Body Dressing/Undressing Lower body dressing      What is the patient wearing?: Underwear/pull up  Lower body assist Assist for lower body dressing: Independent with assitive device     Toileting Toileting Toileting Activity did not occur (Clothing management and hygiene only): N/A (no void or bm)  Toileting assist Assist for toileting: Independent with assistive device     Transfers Chair/bed transfer  Transfers assist     Chair/bed transfer assist level: Supervision/Verbal cueing     Locomotion Ambulation   Ambulation assist      Assist level: Contact Guard/Touching assist Assistive device: Walker-rolling Max distance: 61ft   Walk 10 feet activity   Assist     Assist level: Contact Guard/Touching assist Assistive device: Walker-rolling   Walk 50 feet activity   Assist Walk 50 feet with 2 turns activity  did not occur: Safety/medical concerns  Assist level: Minimal Assistance - Patient > 75%      Walk 150 feet activity   Assist Walk 150 feet activity did not occur: Safety/medical concerns         Walk 10 feet on uneven surface  activity   Assist Walk 10 feet on uneven surfaces activity did not occur: Safety/medical concerns         Wheelchair     Assist Is the patient using a wheelchair?: Yes Type of Wheelchair: Manual    Wheelchair assist level: Independent Max wheelchair distance: 248ft    Wheelchair 50 feet with 2 turns activity    Assist        Assist Level: Independent   Wheelchair 150 feet activity     Assist      Assist Level: Independent   Blood pressure 138/83, pulse 74, temperature (!) 97.5 F (36.4 C), temperature source Oral, resp. rate 18, height 6' (1.829 m), weight 120.9 kg, SpO2 96 %.  Medical Problem List and Plan: 1. Functional deficits secondary to left BKA 04/06/2023 after nonhealing left transmetatarsal amputation of 04/01/2023.  Wound VAC is indicated, will touch base with surgery when this can be removed             -patient may not yet shower             -ELOS/Goals: 11 days, mod I goals with PT, OT           Continue CIR PT and OT  D/c extended to thursday  Con't CIR PT and OT  Messaged ortho to eval for wound vac removal   2.  Impaired mobility: continue Lovenox             -antiplatelet therapy: N/A  3. Pain Management: Oxycodone as needed             -Gabapentin increased to 300mg  TID. Add cymbalta 20mg  daily. Asked Tobi Bastos PT to perform mirror therapy   4. Insomnia: increase gabapentin to 300mg , improved, continue  5. Neuropsych/cognition: This patient is capable of making decisions on his own behalf. 6. Wound vac: placed order for removal once there is no longer drainage 7. Fluids/Electrolytes/Nutrition: Routine in and outs with follow-up chemistries 8.  Acute on chronic anemia.  Follow-up CBC 9.   Hyperglycemia: d/c Juven  10.  Hypertension.  Norvasc d/ced, see below, continue Toprol-XL 25 mg daily.  Monitor with increased mobility. Increase Magnesium gluconate to 500mg  HS   11.  Diastolic/systolic congestive heart failure.  Monitor for any signs of fluid overload.  Echocardiogram January 2023 with ejection fraction of 45 to 50% grade 3 diastolic dysfunction.  12.  Morbid Obesity. BMI reviewed and is improving  13.  History of  alcohol as well as marijuana use.  Provide counseling. Continue magnesium gluconate to 500mg  HS  14.  Hyperlipidemia.  Resolved. Patient is on Lipitor. HDL reviewed and is low, other lipid panel markers are in normal range  15. Slow transit constipation:              -Continue senokot-s at bedtime for maintenance             -add psyllium daily  -asked nursing to bring him prune juice   Discussed that constipation has improved D/c miralax.   Continue magnesium gluconate 500mg  HS  16. Screening for vitamin D deficiency: vitamin D reviewed and is normal  17. Tachypnea: incentive spirometer ordered. Placed nursing order to encourage use of incentive spirometer 10/hour while awake to prevent pneumonia and strengthen lungs.    18. Hypotensive: d/c amlodipine, BP reviewed and is now stable  19. Wound dehiscence: recommended non-adherent dressing. Doxycycline ordered for infection prevention. Ortho consulted 7/3 and evaluated patient  20. Bleeding from residual limb: Lovenox d/ced       LOS: 12 days A FACE TO FACE EVALUATION WAS PERFORMED  [name] P Jouri Threat 04/20/2023, 10:27 AM

## 2023-04-20 NOTE — Progress Notes (Addendum)
  Patient ID: [name], male   DOB: [birth year], 62 y.o.   MRN: [id number]  Team Conference Report to Patient/Family  Team Conference discussion was reviewed with the patient and caregiver, including goals, any changes in plan of care and target discharge date.  Patient and caregiver express understanding and are in agreement.  The patient has a target discharge date of 04/21/23.  Sw met with patient and provided team conference updates. Patient Vac removed yesterday. SW will attempt to add RN to [facility] orders. Patient will need a RW at d/c, Sw will order. Patient established with [facility] but would like to follow up with past therapist to ensure he is using the correct company. Pt expressed that he potentially was established with [facility], Sw will wait for confirmation. No additional questions or concerns.  RW ordered through [employer].   Andria Rhein 04/20/2023, 12:56 PM

## 2023-04-20 NOTE — Plan of Care (Signed)
  Problem: Sit to Stand Goal: LTG:  Patient will perform sit to stand in prep for activites of daily living with assistance level (OT) Description: LTG:  Patient will perform sit to stand in prep for activites of daily living with assistance level (OT) Outcome: Not Met (add Reason) Flowsheets (Taken 04/20/2023 1206) LTG: PT will perform sit to stand in prep for activites of daily living with assistance level: (Not met due to balance deficits, requiring supervision for safety) --   Problem: RH Grooming Goal: LTG Patient will perform grooming w/assist,cues/equip (OT) Description: LTG: Patient will perform grooming with assist, with/without cues using equipment (OT) Outcome: Completed/Met   Problem: RH Bathing Goal: LTG Patient will bathe all body parts with assist levels (OT) Description: LTG: Patient will bathe all body parts with assist levels (OT) Outcome: Completed/Met   Problem: RH Dressing Goal: LTG Patient will perform upper body dressing (OT) Description: LTG Patient will perform upper body dressing with assist, with/without cues (OT). Outcome: Completed/Met Goal: LTG Patient will perform lower body dressing w/assist (OT) Description: LTG: Patient will perform lower body dressing with assist, with/without cues in positioning using equipment (OT) Outcome: Completed/Met   Problem: RH Toileting Goal: LTG Patient will perform toileting task (3/3 steps) with assistance level (OT) Description: LTG: Patient will perform toileting task (3/3 steps) with assistance level (OT)  Outcome: Completed/Met   Problem: RH Toilet Transfers Goal: LTG Patient will perform toilet transfers w/assist (OT) Description: LTG: Patient will perform toilet transfers with assist, with/without cues using equipment (OT) Outcome: Completed/Met

## 2023-04-20 NOTE — Progress Notes (Signed)
Occupational Therapy Discharge Summary  Patient Details  Name: Cohen Novacek MRN: [id number] Date of Birth: [birth year]  Date of Discharge from OT service:April 20, 2023  Patient has met 6 of 7 long term goals due to improved activity tolerance, improved balance, functional use of  RIGHT lower extremity, and improved coordination.  Patient to discharge at overall Modified Independent level.  Patient's care partner is independent to provide the necessary physical assistance at discharge. Pt's girlfriend Missy has been present for several sessions to observe pt's CLOF and has demonstrated proficiency with assisting pt and verbalized readiness to DC.  Reasons goal not met: Sit > stand goal not met at mod I level due to needed supervision for balance deficits   Recommendation:  Patient will benefit from ongoing skilled OT services in home health setting to continue to advance functional skills in the area of BADL and Reduce care partner burden.  Equipment: 3 in 1 BSC (is privately purchasing TTB)  Reasons for discharge: treatment goals met and discharge from hospital  Patient/family agrees with progress made and goals achieved: Yes  OT Discharge Precautions/Restrictions  Precautions Precautions: Fall Required Braces or Orthoses: Other Brace Other Brace: limb protector Restrictions Weight Bearing Restrictions: Yes LLE Weight Bearing: Non weight bearing ADL ADL Eating: Independent Where Assessed-Eating: Bed level Grooming: Independent Where Assessed-Grooming: Sitting at sink Upper Body Bathing: Modified independent Where Assessed-Upper Body Bathing: Sitting at sink Lower Body Bathing: Modified independent Where Assessed-Lower Body Bathing: Bed level Upper Body Dressing: Independent Where Assessed-Upper Body Dressing: Standing at sink Lower Body Dressing: Modified independent Where Assessed-Lower Body Dressing: Bed level, Edge of bed Toileting: Modified independent Where  Assessed-Toileting: Bedside Commode Toilet Transfer: Modified independent Toilet Transfer Method: Other (comment) (lateral scoot) Acupuncturist: Extra wide drop arm bedside commode Tub/Shower Transfer: Close supervison Tub/Shower Transfer Method: Ambulating, Sit pivot Tub/Shower Equipment: Insurance underwriter: Not assessed Film/video editor Method: Unable to assess Vision Baseline Vision/History: 1 Wears glasses Patient Visual Report: No change from baseline Vision Assessment?: No apparent visual deficits Perception  Perception: Within Functional Limits Praxis Praxis: Intact Cognition Cognition Overall Cognitive Status: Within Functional Limits for tasks assessed Arousal/Alertness: Awake/alert Orientation Level: Person;Place;Situation Person: Oriented Place: Oriented Situation: Oriented Memory: Appears intact Awareness: Appears intact Problem Solving: Appears intact Safety/Judgment: Appears intact Brief Interview for Mental Status (BIMS) Repetition of Three Words (First Attempt): 3 Temporal Orientation: Year: Correct Temporal Orientation: Month: Accurate within 5 days Temporal Orientation: Day: Correct Recall: "Sock": Yes, no cue required Recall: "Blue": Yes, no cue required Recall: "Bed": Yes, no cue required BIMS Summary Score: 15 Sensation Sensation Light Touch: Impaired by gross assessment Proprioception: Impaired by gross assessment Additional Comments: RLE neuropathy. decreased sensation along R lower leg and absent at R medial/lateral malleoli and R great toe Coordination Gross Motor Movements are Fluid and Coordinated: No Fine Motor Movements are Fluid and Coordinated: Yes Coordination and Movement Description: grossly uncoordinated due to pain, decreased sensation, and altered balance strategies from L BKA Finger Nose Finger Test: [facility] bilaterally Heel Shin Test: unable to perform on LLE due to BKA Motor  Motor Motor:  Abnormal postural alignment and control Motor - Skilled Clinical Observations: altered balance strategies from L BKA Mobility  Bed Mobility Bed Mobility: Rolling Right;Rolling Left;Supine to Sit;Sit to Supine Rolling Right: Independent with assistive device Rolling Left: Independent with assistive device Supine to Sit: Independent with assistive device Sit to Supine: Independent with assistive device Transfers Sit to Stand: Supervision/Verbal cueing Stand to Sit:  Supervision/Verbal cueing  Trunk/Postural Assessment  Cervical Assessment Cervical Assessment: Within Functional Limits Thoracic Assessment Thoracic Assessment: Within Functional Limits Lumbar Assessment Lumbar Assessment: Exceptions to [facility] (posterior pelvic tilt) Postural Control Postural Control: Deficits on evaluation Righting Reactions: delayed on L due to BKA Protective Responses: delayed on L due to BKA  Balance Balance Balance Assessed: Yes Static Sitting Balance Static Sitting - Balance Support: Feet supported;No upper extremity supported Static Sitting - Level of Assistance: 7: Independent Dynamic Sitting Balance Dynamic Sitting - Balance Support: Feet supported;No upper extremity supported Dynamic Sitting - Level of Assistance: 6: Modified independent (Device/Increase time) Static Standing Balance Static Standing - Balance Support: Bilateral upper extremity supported (RW) Static Standing - Level of Assistance: 5: Stand by assistance (supervision) Dynamic Standing Balance Dynamic Standing - Balance Support: Bilateral upper extremity supported (RW) Dynamic Standing - Level of Assistance: 5: Stand by assistance (supervision) Dynamic Standing - Comments: with transfers Extremity/Trunk Assessment RUE Assessment RUE Assessment: Within Functional Limits General Strength Comments: 4/5 LUE Assessment LUE Assessment: Within Functional Limits General Strength Comments: 4/5   Nabria Nevin E Hansini Clodfelter, MS,  OTR/L  04/20/2023, 12:06 PM

## 2023-04-20 NOTE — Patient Care Conference (Signed)
Inpatient RehabilitationTeam Conference and Plan of Care Update Date: 04/20/2023   Time: 11:32 AM    Patient Name: [name]      Medical Record Number: [id number]  Date of Birth: 1961/01/01 Sex: Male         Room/Bed: 4W02C/4W02C-01 Payor Info: Payor: MEDICARE / Plan: MEDICARE PART A AND B / Product Type: *No Product type* /    Admit Date/Time:  04/08/2023  2:08 PM  Primary Diagnosis:  Left below-knee amputee [facility])  Hospital Problems: Principal Problem:   Left below-knee amputee [facility]) Active Problems:   Adjustment disorder with anxious mood    Expected Discharge Date: Expected Discharge Date: 04/21/23  Team Members Present: Physician leading conference: Dr. Sula Soda Social Worker Present: Lavera Guise, BSW Nurse Present: Chana Bode, RN PT Present: Raechel Chute, PT OT Present: Candee Furbish, OT PPS Coordinator present : Fae Pippin, SLP     Current Status/Progress Goal Weekly Team Focus  Bowel/Bladder   Pt is continent of bowel/bladder   Pt will remain continent of bowel/bladder   Will assess qshift and PRN    Swallow/Nutrition/ Hydration               ADL's   Mod I UB bathing/dressing, CGA LB bathing/dressing, CGA toileting   Mod I ADLs, supervision transfers   Barriers to DC- wound vac in place which limits ability to educate on limb care/bathing at the shower level in prep for DC; plan to simulate wound care and introduce care materials, and discuss ways to manage portable vac with mobility incase he DC with it    Mobility   bed moblity supervision/mod I, sit<>stands and stand<>pivot with RW and CGA/close supervision, gait 61ft with RW and CGA, WC mobility 110ft mod I   Mod I transfers, supervision gait  barriers: wound vac care due to continuous drainage from incision site    Communication                Safety/Cognition/ Behavioral Observations               Pain   Pt denies pain   Pt will continue to deny pain   Will assess  qshift and PRN    Skin   Pt has left BKA that had wound vac that was dc'd on 7/2 and now has a dressing covering incision   Pt's BKA will heal  Will assess qshift and PRN      Discharge Planning:  Discharging home on Thursday. HH established with [facility]. Sw waiting ortho visit to confirm vac status for d/c.   Team Discussion: Patient post wound vac removal with dressing on left BKA site; incision dehiscence and staple popped x 2; MD aware.  Shrinker and dressing applied to incision.  Patient on target to meet rehab goals: Currently mod I for ADLs. Completes sit -stand with close supervision. Will not meet supervision for gait PT goal. Plateaued overall.  *See Care Plan and progress notes for long and short-term goals.   Revisions to Treatment Plan:  Wound vac removal   Teaching Needs: Safety, medications, dietary modifications, skin care, etc.   Current Barriers to Discharge: Decreased caregiver support and Home enviroment access/layout  Possible Resolutions to Barriers: HH follow up care DME: BSC, RW, TTB Ramp installation recommended Already has a wheelchair     Medical Summary Current Status: residual limb wound dehiscience, hypotension, phantom limb pain  Barriers to Discharge: Morbid Obesity;Medical stability;Complicated Wound  Barriers to Discharge Comments: residual limb wound  dehiscience, hypotension, phantom limb pain Possible Resolutions to [employer] Focus: doxycycline started prophylactically, amlodipine discontinued, continue cymbalta/gabapentin/oxycodone, ramp completed, provided dietary education   Continued Need for Acute Rehabilitation Level of Care: The patient requires daily medical management by a physician with specialized training in physical medicine and rehabilitation for the following reasons: Direction of a multidisciplinary physical rehabilitation program to maximize functional independence : Yes Medical management of patient stability  for increased activity during participation in an intensive rehabilitation regime.: Yes Analysis of laboratory values and/or radiology reports with any subsequent need for medication adjustment and/or medical intervention. : Yes   I attest that I was present, lead the team conference, and concur with the assessment and plan of the team.   Chana Bode B 04/20/2023, 3:10 PM

## 2023-04-20 NOTE — Progress Notes (Signed)
Occupational Therapy Session Note  Patient Details  Name: [name] MRN: [id number] Date of Birth: [birth year]  Today's Date: 04/20/2023 OT Individual Time: 4098-1191 OT Individual Time Calculation (min): 70 min    Short Term Goals: Week 2:  OT Short Term Goal 1 (Week 2): Continue progressing towards LTGs (DC date extended therefore no STG previously made)  Skilled Therapeutic Interventions/Progress Updates:  Skilled OT intervention completed with focus on toilet transfer education, w/c mobility, residual limb strengthening and contracture prevention. Pt received seated EOB agreeable to session. No pain reported.  Pt reported frustration with wound vac removal and "lack of bedside manner" by PA when it was removed. OT offered support and offered therapeutic listening. OT offered shower due to wound vac removal however pt politely declined. Education provided on how to apply water proof cover via bag and tape however pt indicates he's been able to do this with prior injury.  Discussed options for toilet transfers if pain is high or if he is feeling weak, including lateral scoot onto wide drop arm BSC. Pt was able to demo ability to lateral scoot > L side without assist or cues at mod I level. Supervision sit > stand using RW then OT switched BSC for w/c.   Pt self-propelled in w/c about 100 ft to gym with mod I. Nurse present to administer meds while pt on rest break.   Supervision sit > stand using RW then supervision stand pivot using RW to EOM. Doffed limb guard without A. Completed x10 knee flexion <> extension.   Transitioned supine with mod I. Pillow/wedge provided for comfort. Pt completed the following exercises to address Lt residual limb strengthening and contracture prevention needed for proper wear and biomechanics of a prosthetic: Supine -knee extension with limb on foam block for greater extension, 5 sec hold x12 -Lt hip flexion x12 Rt sidelying -hip abduction  x10 -Circumduction clockwise x10 -Circumduction counterclockwise x10 Prone -hip extension, 3 sec hold x5, with cues needed for stabilization at hip -residual limb knee flexion& extension x10  Transitioned to EOM with mod I. Donned limb guard independently. Supervision sit > stand using RW and stand pivot to w/c. Transported dependently in w/c back to room and same transfer back to EOB. Pt remained seated EOB, with bed alarm on/activated, and with all needs in reach at end of session.   Therapy Documentation Precautions:  Precautions Precautions: Fall Precaution Comments: Reviewed BKA precautions Required Braces or Orthoses: Other Brace Other Brace: limb protector Restrictions Weight Bearing Restrictions: Yes LLE Weight Bearing: Non weight bearing    Therapy/Group: Individual Therapy  [name], MS, OTR/L  04/20/2023, 11:44 AM

## 2023-04-20 NOTE — Progress Notes (Signed)
Physical Therapy Session Note  Patient Details  Name: [name] MRN: [id number] Date of Birth: Mar 31, 1961  Today's Date: 04/20/2023 PT Individual Time: [phone] and 1345-1457 PT Individual Time Calculation (min): 41 min  and 72 min Today's Date: 04/20/2023 PT Missed Time: 19 Minutes Missed Time Reason: Other (Comment) (delay in care)  Short Term Goals: Week 1:  PT Short Term Goal 1 (Week 1): STG = LTG 2/2 LOS Week 2:  PT Short Term Goal 1 (Week 2): STG=LTG due to LOS  Skilled Therapeutic Interventions/Progress Updates:   Treatment Session 1 Received pt semi-reclined in bed, pt agreeable to PT treatment, and reported pain 2-3/10 in L residual limb. Session with emphasis on wound care, limb loss education, shrinker care, and wrapping technique. MD arrived for morning rounds - removed shrinker and unwrapped L residual limb. Noted gauze adhering to incision site - utilized saline solution to loosen gauze and PT/MD inspected limb. Noted bright red drainage and small opening in middle of incision indicating mild dehiscence. Gathered dressing supplies and instructed pt on technique for dressing wound. Placed 2 non-adherent pads directly over incision, donned 4XL shrinker with max A, and placed abdominal pad, gauze, and ace wrap over top of shrinker to catch any additional drainage. Discussed importance of daily skin inspections, shrinker wear/care schedule, and progression to 3XL shrinker for more compression to shape limb for prosthetic fitting. Concluded session with pt semi-reclined in bed with all needs within reach. 19 minutes missed of skilled physical therapy due to delay in care.   Treatment Session 2 Received pt semi-reclined in bed with girlfriend present at bedside. Pt agreeable to PT treatment and reported pain 2/10 in L residual limb. Session with emphasis on family education training, functional mobility/transfers, generalized strengthening and endurance, dynamic standing  balance/coordination, and gait training. Pt provided PT with information on preferred [name] agency - passed information along to CSW. Removed limb guard to ensure drainage wasn't seeping through ace bandage - no drainage noted. Educated pt on technique for ace wrapping (avoiding tourniquet effect and preventing windows/gaps).   Pt performed all transfers with RW and close supervision throughout session with girlfriend providing assist. Pt performed WC mobility 146ft using BUE and mod I to dayroom with emphasis on UE strength. Pt's new walker delivered and adjusted height and switched out legs. Stood from [facility] with RW and supervision and ambulated 78ft with RW and close supervision provided by girlfriend - cues to remain on pt's L side in case of LOB. Pt then worked on sideways hopping 2x43ft with RW and close supervision along width of mat to simulate bathroom entry. Pt stood from [facility] with RW and supervision and able to pick up tissue box from floor using reacher and supervision - pt reports having 2 reachers at home. Pt then performed ball toss 2x20 reps with 5lb dowel with emphasis on UE and core strength and coordination - limited ROM due to R RTC tear. Pt then performed 2x20 bicep curls with 14lb dowel with empahsis on UE strength. Pt returned to [facility] with assist provided by girlfriend and transported back to room in [facility] dependently. Returned to bed and concluded session with pt sitting EOB with all needs within reach.   Therapy Documentation Precautions:  Precautions Precautions: Fall, Knee Precaution Comments: Reviewed BKA precautions Required Braces or Orthoses: Other Brace Other Brace: Ampu-shield Restrictions Weight Bearing Restrictions: Yes LLE Weight Bearing: Non weight bearing  Therapy/Group: Individual Therapy [name] [name] PT, DPT 04/20/2023, 7:01 AM

## 2023-04-20 NOTE — Progress Notes (Signed)
Physical Therapy Discharge Summary  Patient Details  Name: [name] MRN: [id number] Date of Birth: [birth year]  Date of Discharge from PT service:April 20, 2023  Patient has met 2 of 7 long term goals due to improved activity tolerance, improved balance, improved postural control, increased strength, increased range of motion, ability to compensate for deficits, improved awareness, and improved coordination. Patient to discharge at a wheelchair level Supervision.  Patient's care partner is independent to provide the necessary physical assistance at discharge. Pt's girlfriend attended family education training multiple times and verbalized and demonstrated confidence with tasks to ensure safe discharge home.   Reasons goals not met: Pt did not meet mod I goals as pt currently requires supervision due to altered balance strategies due to L BKA.   Recommendation:  Patient will benefit from ongoing skilled PT services in home health setting to continue to advance safe functional mobility, address ongoing impairments in transfers, generalized strengthening and endurance, dynamic standing balance/coordination, gait training, limb loss education, and to minimize fall risk.  Equipment: RW - already has WC and getting ramp installed  Reasons for discharge: treatment goals met and discharge from hospital  Patient/family agrees with progress made and goals achieved: Yes  PT Discharge Precautions/Restrictions Precautions Precautions: Fall Required Braces or Orthoses: Other Brace Other Brace: limb protector Restrictions Weight Bearing Restrictions: Yes LLE Weight Bearing: Non weight bearing Pain Interference Pain Interference Pain Effect on Sleep: 1. Rarely or not at all Pain Interference with Therapy Activities: 2. Occasionally Pain Interference with Day-to-Day Activities: 1. Rarely or not at all Cognition Overall Cognitive Status: Within Functional Limits for tasks  assessed Arousal/Alertness: Awake/alert Orientation Level: Oriented X4 Memory: Appears intact Awareness: Appears intact Problem Solving: Appears intact Safety/Judgment: Appears intact Sensation Sensation Light Touch: Impaired by gross assessment Proprioception: Impaired by gross assessment Additional Comments: RLE neuropathy. decreased sensation along R lower leg and absent at R medial/lateral malleoli and R great toe Coordination Gross Motor Movements are Fluid and Coordinated: No Fine Motor Movements are Fluid and Coordinated: Yes Coordination and Movement Description: grossly uncoordinated due to pain, decreased sensation, and altered balance strategies from L BKA Finger Nose Finger Test: [facility] bilaterally Heel Shin Test: unable to perform on LLE due to BKA Motor  Motor Motor: Abnormal postural alignment and control Motor - Skilled Clinical Observations: altered balance strategies from L BKA  Mobility Bed Mobility Bed Mobility: Rolling Right;Rolling Left;Supine to Sit;Sit to Supine Rolling Right: Independent with assistive device Rolling Left: Independent with assistive device Supine to Sit: Independent with assistive device Sit to Supine: Independent with assistive device Transfers Transfers: Sit to Stand;Stand to Sit;Stand Pivot Transfers Sit to Stand: Supervision/Verbal cueing Stand to Sit: Supervision/Verbal cueing Stand Pivot Transfers: Supervision/Verbal cueing Stand Pivot Transfer Details: Verbal cues for safe use of DME/AE Stand Pivot Transfer Details (indicate cue type and reason): occasional cues to keep RW within BOS Transfer (Assistive device): Rolling walker Locomotion  Gait Ambulation: Yes Gait Assistance: Contact Guard/Touching assist Gait Distance (Feet): 36 Feet Assistive device: Rolling walker Gait Assistance Details: Verbal cues for precautions/safety Gait Assistance Details: occasional verbal cues for recognition of onset of fatigue with decreased R  foot clearance Gait Gait: Yes Gait Pattern: Impaired ("hop to" pattern) Gait velocity: decreased High Level Ambulation High Level Ambulation: Side stepping Side Stepping: able to sideways "hop" ~19ft to R with CGA to simulate bathroom entry Stairs / Additional Locomotion Stairs: No Wheelchair Mobility Wheelchair Mobility: Yes Wheelchair Assistance: Independent with Scientist, research (life sciences): Both upper extremities Wheelchair  Parts Management: Independent Distance: 261ft  Trunk/Postural Assessment  Cervical Assessment Cervical Assessment: Within Functional Limits Thoracic Assessment Thoracic Assessment: Within Functional Limits Lumbar Assessment Lumbar Assessment: Exceptions to [facility] (posterior pelvic tilt) Postural Control Postural Control: Deficits on evaluation Righting Reactions: delayed on L due to BKA Protective Responses: delayed on L due to BKA  Balance Balance Balance Assessed: Yes Static Sitting Balance Static Sitting - Balance Support: Feet supported;No upper extremity supported Static Sitting - Level of Assistance: 7: Independent Dynamic Sitting Balance Dynamic Sitting - Balance Support: Feet supported;No upper extremity supported Dynamic Sitting - Level of Assistance: 6: Modified independent (Device/Increase time) Static Standing Balance Static Standing - Balance Support: Bilateral upper extremity supported (RW) Static Standing - Level of Assistance: 5: Stand by assistance (supervision) Dynamic Standing Balance Dynamic Standing - Balance Support: Bilateral upper extremity supported (RW) Dynamic Standing - Level of Assistance: 5: Stand by assistance (supervision) Dynamic Standing - Comments: with transfers Extremity Assessment  RLE Assessment RLE Assessment: Exceptions to [facility] General Strength Comments: tested sitting in WC RLE Strength Right Hip Flexion: 4+/5 Right Hip ABduction: 4+/5 Right Hip ADduction: 4+/5 Right Knee Flexion: 4+/5 Right  Knee Extension: 4/5 Right Ankle Dorsiflexion: 4+/5 Right Ankle Plantar Flexion: 5/5 LLE Assessment LLE Assessment: Exceptions to [facility] General Strength Comments: tested sitting in WC LLE Strength Left Hip Flexion: 4-/5 Left Hip ABduction: 4/5 Left Hip ADduction: 4/5 Left Knee Flexion: 4/5 Left Knee Extension: 4/5   [name] [name] PT, DPT 04/20/2023, 7:10 AM

## 2023-04-20 NOTE — Progress Notes (Signed)
Patient ID: [name], male   DOB: 1961/08/16, 62 y.o.   MRN: [id number]  [facility] referral sent to [facility].

## 2023-04-21 LAB — GLUCOSE, CAPILLARY
Glucose-Capillary: 104 mg/dL — ABNORMAL HIGH (ref 70–99)
Glucose-Capillary: 114 mg/dL — ABNORMAL HIGH (ref 70–99)

## 2023-04-21 NOTE — Progress Notes (Signed)
Had a quite night. Rt leg toe scab wound with guaze dressing changed. Requested stump dressing be evaluated today before discharge. Dressing intact with shrinker in place. Safety maintained.

## 2023-04-21 NOTE — Progress Notes (Addendum)
Inpatient Rehabilitation Discharge Medication Review by a Pharmacist  A complete drug regimen review was completed for this patient to identify any potential clinically significant medication issues.  High Risk Drug Classes Is patient taking? Indication by Medication  Antipsychotic No   Anticoagulant No   Antibiotic Yes Doxycycline - wound infection  Opioid Yes Oxycodone IR - pain mgmt  Antiplatelet No   Hypoglycemics/insulin No   Vasoactive Medication Yes Amlodipine, metoprolol - HTN Lasix - prn additional fluid  Chemotherapy No   Other Yes Acamprosate - AUD Gabapentin, Cymbalta - neuropathic/phantom limb pain Atorvastatin - HLD Protonix - GERD     Type of Medication Issue Identified Description of Issue Recommendation(s)  Drug Interaction(s) (clinically significant)     Duplicate Therapy     Allergy     No Medication Administration End Date     Incorrect Dose     Additional Drug Therapy Needed     Significant med changes from prior encounter (inform family/care partners about these prior to discharge).    Other       Clinically significant medication issues were identified that warrant physician communication and completion of prescribed/recommended actions by midnight of the next day:  Yes  Name of provider notified for urgent issues identified: amlodipine continued at discharge but was discontinued this admission for low blood pressures.   Provider Method of Notification: Raulkar  Pharmacist comments: per discussion w/ MD - will continue amlodipine 2.5mg  in the evenings  Time spent performing this drug regimen review (minutes):  15  Rexford Maus, PharmD, BCPS 04/19/2023 1:23 PM

## 2023-04-21 NOTE — Progress Notes (Signed)
PROGRESS NOTE   Subjective/Complaints: No new complaints this morning Feels good with dressing changes BP stable Has ortho f/u on tuesday  ROS:   Pt denies SOB, abd pain, CP, N/V/C/D, and vision changes, phantom limb pain is well controlled  Objective:   No results found. No results for input(s): "WBC", "HGB", "HCT", "PLT" in the last 72 hours.   No results for input(s): "NA", "K", "CL", "CO2", "GLUCOSE", "BUN", "CREATININE", "CALCIUM" in the last 72 hours.    Intake/Output Summary (Last 24 hours) at 04/21/2023 0952 Last data filed at 04/21/2023 0700 Gross per 24 hour  Intake 597 ml  Output 600 ml  Net -3 ml        Physical Exam: Vital Signs Blood pressure 128/65, pulse 78, temperature 97.8 F (36.6 C), resp. rate 16, height 6' (1.829 m), weight 119.9 kg, SpO2 95 %.  General: awake, alert, appropriate, sitting up in bed; NAD, BMI 35.85 HENT: conjugate gaze; oropharynx moist CV: regular rate and rhythm; no JVD Pulmonary: CTA B/L; no W/R/R- good air movement GI: soft, NT, ND, (+)BS Psychiatric: appropriate- interactive Neurological: Ox3 Much more awake, alert today Extremities: wound vac still draining Musculoskeletal: limb guard and shrinker/VAC in place      General: Swelling and tenderness present. Normal range of motion.     Cervical back: Normal range of motion.  Skin:    Comments: Left BKA with wound VAC in place. 1/2 full cannister on VAC with sanguinous drainage- up slightly to 5th mark out of 9 notches  Right foot with abrasion on proximal second toe d/t severe hallux valgus, foot deformity. Right heel slightly boggy  Neurological:     Mental Status: He is alert.     Comments: Alert and oriented x 3. Normal insight and awareness. Intact Memory. Normal language and speech. Cranial nerve exam unremarkable. MMT: 5/5 UE. RLE grossly 4+ to 5/5. LLE 3/5 prox, limited by pain/vac.  Stocking glove sensory loss  RLE below knee, severe below sock line. DTR's trace to absent.   Psychiatric:        Mood and Affect: Mood normal.        Behavior: Behavior normal.  GU: continent of bowel   Assessment/Plan: 1. Functional deficits which require 3+ hours per day of interdisciplinary therapy in a comprehensive inpatient rehab setting. Physiatrist is providing close team supervision and 24 hour management of active medical problems listed below. Physiatrist and rehab team continue to assess barriers to discharge/monitor patient progress toward functional and medical goals  Care Tool:  Bathing    Body parts bathed by patient: Right arm, Left arm, Chest, Abdomen, Front perineal area, Buttocks, Right upper leg, Left upper leg, Left lower leg, Face   Body parts bathed by helper: Right lower leg Body parts n/a: Left lower leg   Bathing assist Assist Level: Independent with assistive device     Upper Body Dressing/Undressing Upper body dressing   What is the patient wearing?: Pull over shirt    Upper body assist Assist Level: Independent    Lower Body Dressing/Undressing Lower body dressing      What is the patient wearing?: Underwear/pull up     Lower body  assist Assist for lower body dressing: Independent with assitive device     Toileting Toileting Toileting Activity did not occur (Clothing management and hygiene only): N/A (no void or bm)  Toileting assist Assist for toileting: Independent with assistive device     Transfers Chair/bed transfer  Transfers assist     Chair/bed transfer assist level: Supervision/Verbal cueing     Locomotion Ambulation   Ambulation assist      Assist level: Contact Guard/Touching assist Assistive device: Walker-rolling Max distance: 42ft   Walk 10 feet activity   Assist     Assist level: Contact Guard/Touching assist Assistive device: Walker-rolling   Walk 50 feet activity   Assist Walk 50 feet with 2 turns activity did not  occur: Safety/medical concerns  Assist level: Minimal Assistance - Patient > 75%      Walk 150 feet activity   Assist Walk 150 feet activity did not occur: Safety/medical concerns         Walk 10 feet on uneven surface  activity   Assist Walk 10 feet on uneven surfaces activity did not occur: Safety/medical concerns         Wheelchair     Assist Is the patient using a wheelchair?: Yes Type of Wheelchair: Manual    Wheelchair assist level: Independent Max wheelchair distance: 272ft    Wheelchair 50 feet with 2 turns activity    Assist        Assist Level: Independent   Wheelchair 150 feet activity     Assist      Assist Level: Independent   Blood pressure 128/65, pulse 78, temperature 97.8 F (36.6 C), resp. rate 16, height 6' (1.829 m), weight 119.9 kg, SpO2 95 %.  Medical Problem List and Plan: 1. Functional deficits secondary to left BKA 04/06/2023 after nonhealing left transmetatarsal amputation of 04/01/2023.  Wound VAC is indicated, will touch base with surgery when this can be removed             -patient may not yet shower             -ELOS/Goals: 11 days, mod I goals with PT, OT           Continue CIR PT and OT  D/c extended to thursday  Con't CIR PT and OT  Messaged ortho to eval for wound vac removal  Encouraged protein rich diet   2.  Impaired mobility: continue Lovenox             -antiplatelet therapy: N/A  3. Pain Management: Oxycodone as needed             -Gabapentin increased to 300mg  TID. Add cymbalta 20mg  daily. Asked Tobi Bastos PT to perform mirror therapy   4. Insomnia: increase gabapentin to 300mg , improved, continue  5. Neuropsych/cognition: This patient is capable of making decisions on his own behalf. 6. Wound vac: placed order for removal once there is no longer drainage 7. Fluids/Electrolytes/Nutrition: Routine in and outs with follow-up chemistries 8.  Acute on chronic anemia.  Follow-up CBC 9.  Hyperglycemia:  d/c Juven  10.  Hypertension.  Norvasc d/ced, see below, continue Toprol-XL 25 mg daily.  Monitor with increased mobility. Increase Magnesium gluconate to 500mg  HS   11.  Diastolic/systolic congestive heart failure.  Monitor for any signs of fluid overload.  Echocardiogram January 2023 with ejection fraction of 45 to 50% grade 3 diastolic dysfunction.  12.  Morbid Obesity. BMI reviewed and is improving  13.  History of alcohol  as well as marijuana use.  Provide counseling. Continue magnesium gluconate to 500mg  HS  14.  Hyperlipidemia.  Resolved. Patient is on Lipitor. HDL reviewed and is low, other lipid panel markers are in normal range  15. Slow transit constipation:              -Continue senokot-s at bedtime for maintenance             -add psyllium daily  -asked nursing to bring him prune juice   Discussed that constipation has improved D/c miralax.   Continue magnesium gluconate 500mg  HS  16. Screening for vitamin D deficiency: vitamin D reviewed and is normal  17. Tachypnea: incentive spirometer ordered. Placed nursing order to encourage use of incentive spirometer 10/hour while awake to prevent pneumonia and strengthen lungs.    18. Hypotensive: d/c amlodipine, BP reviewed and is now stable  19. Wound dehiscence: recommended non-adherent dressing. Doxycycline ordered for infection prevention. Ortho consulted 7/3 and evaluated patient, discussed that he has f/u on Tuesday with ortho  20. Bleeding from residual limb: Lovenox d/ced, improved, encouraged moving limbs while off lovenox for clot prevention   >30 minutes spent in discharge of patient including review of medications and follow-up appointments, physical examination, and in answering all patient's questions    LOS: 13 days A FACE TO FACE EVALUATION WAS PERFORMED  Clint Bolder P Tres Grzywacz 04/21/2023, 9:52 AM

## 2023-04-21 NOTE — Progress Notes (Signed)
Patient ID: [name], male   DOB: May 10, 1961, 62 y.o.   MRN: [id number]  [facility] orders scanned to [employer].

## 2023-04-21 NOTE — Progress Notes (Signed)
Inpatient Rehabilitation Care Coordinator Discharge Note   Patient Details  Name: [name] MRN: [id number] Date of Birth: [birth year]   Discharge location: Home  Length of Stay: 13 Days  Discharge activity level: Sup/Mod I  Home/community participation: S.O, [name]  Patient response WU:[id number] Literacy - How often do you need to have someone help you when you read instructions, pamphlets, or other written material from your doctor or pharmacy?: Never  Patient response [id number] Isolation - How often do you feel lonely or isolated from those around you?: Never  Services provided included: MD, RD, PT, OT, SLP, CM, RN, Pharmacy, TR, Neuropsych, SW  Financial Services:  Field seismologist Utilized: Medicare    Choices offered to/list presented to: Patient  Follow-up services arranged:  Home Health, DME Home Health Agency: [facility]- RN PT OT    DME : Patient prefers to [employer] private    Patient response to transportation need: Is the patient able to respond to transportation needs?: Yes In the past 12 months, has lack of transportation kept you from medical appointments or from getting medications?: No In the past 12 months, has lack of transportation kept you from meetings, work, or from getting things needed for daily living?: No   Patient/Family verbalized understanding of follow-up arrangements:  Yes  Individual responsible for coordination of the follow-up plan: self  Confirmed correct DME delivered: Andria Rhein 04/21/2023    Comments (or additional information):  Summary of Stay    Date/Time Discharge Planning CSW  04/19/23 1308 Discharging home on Thursday. HH established with [facility]. Sw waiting ortho visit to confirm vac status for d/c. CJB  04/12/23 1355 Discharging back home with S.O, who travels for work. Patient anticipating MOD I goals. 2 level home, 3 steps CJB       Andria Rhein

## 2023-04-25 ENCOUNTER — Telehealth: Payer: Self-pay

## 2023-04-25 NOTE — Transitions of Care (Post Inpatient/ED Visit) (Signed)
04/25/2023  Name: [name] MRN: [id number] DOB: 04-Jul-1961  Today's TOC FU Call Status: Today's TOC FU Call Status:: Successful TOC FU Call Competed TOC FU Call Complete Date: 04/25/23  Transition Care Management Follow-up Telephone Call Date of Discharge: 04/21/23 Discharge Facility: [facility]) Type of Discharge: Inpatient Admission Primary Inpatient Discharge Diagnosis:: Left below-knee amputee How have you been since you were released from the hospital?: Better Any questions or concerns?: No  Items Reviewed: Did you receive and understand the discharge instructions provided?: Yes Medications obtained,verified, and reconciled?: Yes (Medications Reviewed) Any new allergies since your discharge?: No Dietary orders reviewed?: Yes Do you have support at home?: Yes  Medications Reviewed Today: Medications Reviewed Today     Reviewed by Merleen Nicely, LPN (Licensed Practical Nurse) on 04/25/23 at [phone]  Med List Status: <None>   Medication Order Taking? Sig Documenting Provider Last Dose Status Informant  acamprosate (CAMPRAL) 333 MG tablet [id number] Yes Take 1 tablet (333 mg total) by mouth 2 (two) times daily. Charlton Amor, PA-C Taking Active   acetaminophen (TYLENOL) 325 MG tablet [id number] Yes Take 2 tablets (650 mg total) by mouth every 6 (six) hours as needed for mild pain (or Fever >/= 101). Charlton Amor, PA-C Taking Active   amLODipine (NORVASC) 2.5 MG tablet [id number] Yes Take 1 tablet (2.5 mg total) by mouth daily. Charlton Amor, PA-C Taking Active   ascorbic acid (VITAMIN C) 1000 MG tablet [id number] Yes Take 1 tablet (1,000 mg total) by mouth daily. Charlton Amor, PA-C Taking Active   atorvastatin (LIPITOR) 80 MG tablet [id number] Yes Take 1 tablet (80 mg total) by mouth daily. Charlton Amor, PA-C Taking Active   cholecalciferol (VITAMIN D3) 25 MCG (1000 UNIT) tablet [id number] Yes Take 2 tablets (2,000 Units total) by mouth daily.  Charlton Amor, PA-C Taking Active   doxycycline (VIBRA-TABS) 100 MG tablet [id number] Yes Take 1 tablet (100 mg total) by mouth every 12 (twelve) hours. Charlton Amor, PA-C Taking Active   DULoxetine (CYMBALTA) 20 MG capsule [id number] Yes Take 1 capsule (20 mg total) by mouth daily. Charlton Amor, PA-C Taking Active   folic acid (FOLVITE) 1 MG tablet [id number] Yes Take 1 tablet (1 mg total) by mouth daily. Charlton Amor, PA-C Taking Active   furosemide (LASIX) 40 MG tablet [id number] Yes Take 1 tablet (40 mg total) by mouth daily as needed. Charlton Amor, PA-C Taking Active   gabapentin (NEURONTIN) 300 MG capsule [id number] Yes Take 1 capsule (300 mg total) by mouth 3 (three) times daily. Charlton Amor, PA-C Taking Active   Magnesium 400 MG TABS [id number] Yes Take 400 mg by mouth daily. Charlton Amor, PA-C Taking Active   metoprolol succinate (TOPROL-XL) 25 MG 24 hr tablet [id number] Yes Take 1 tablet (25 mg total) by mouth daily. Charlton Amor, PA-C Taking Active   nitroGLYCERIN (NITROSTAT) 0.4 MG SL tablet [id number] Yes Place 1 tablet (0.4 mg total) under the tongue every 5 (five) minutes x 3 doses as needed for chest pain. Charlton Amor, PA-C Taking Active   oxyCODONE (OXY IR/ROXICODONE) 5 MG immediate release tablet [id number] Yes Take 1-2 tablets (5-10 mg total) by mouth every 4 (four) hours as needed for moderate pain (pain score 4-6). Charlton Amor, PA-C Taking Active   pantoprazole (PROTONIX) 40 MG tablet [id number] Yes Take 1 tablet (40 mg total) by mouth daily. Charlton Amor, PA-C Taking Active   senna-docusate (SENOKOT-S) 8.6-50  MG tablet [id number] Yes Take 1 tablet by mouth at bedtime. Charlton Amor, PA-C Taking Active   thiamine (VITAMIN B1) 100 MG tablet [id number] Yes Take 1 tablet (100 mg total) by mouth daily. Charlton Amor, PA-C Taking Active   vitamin B-12 (CYANOCOBALAMIN) 100 MCG tablet [id number] Yes TAKE 1 TABLET BY MOUTH  EVERY DAY Angiulli, Mcarthur Rossetti, PA-C Taking Active   Zinc Sulfate 220 (50 Zn) MG TABS [id number] Yes Take 1 tablet (220 mg total) by mouth daily. Charlton Amor, PA-C Taking Active   Med List Note Margo Aye, [place] D, CPhT 02/28/22 2211): Resident at [facility] [phone]            Home Care and Equipment/Supplies: Were Home Health Services Ordered?: Yes Name of Home Health Agency:: [facility] Has Agency set up a time to come to your home?: Yes First Home Health Visit Date: 04/22/23 Any new equipment or medical supplies ordered?: Yes Name of Medical supply agency?: facility Were you able to get the equipment/medical supplies?: Yes (walker, BSC, lifted toilet seat) Do you have any questions related to the use of the equipment/supplies?: No  Functional Questionnaire: Do you need assistance with bathing/showering or dressing?: Yes Do you need assistance with meal preparation?: No Do you need assistance with eating?: No Do you have difficulty maintaining continence: No Do you need assistance with getting out of bed/getting out of a chair/moving?: Yes Do you have difficulty managing or taking your medications?: No  Follow up appointments reviewed: PCP Follow-up appointment confirmed?: No MD Provider Line Number:[phone] Given: Yes Specialist Hospital Follow-up appointment confirmed?: Yes Date of Specialist follow-up appointment?: 04/26/23 Follow-Up Specialty Provider:: Dr Lajoyce Corners Do you need transportation to your follow-up appointment?: No Do you understand care options if your condition(s) worsen?: Yes-patient verbalized understanding    SIGNATURE  Woodfin Ganja LPN [facility] Nurse Health Advisor Direct Dial [phone]

## 2023-04-26 ENCOUNTER — Encounter: Payer: Self-pay | Admitting: Orthopedic Surgery

## 2023-04-26 ENCOUNTER — Ambulatory Visit (INDEPENDENT_AMBULATORY_CARE_PROVIDER_SITE_OTHER): Payer: Medicare Other | Admitting: Orthopedic Surgery

## 2023-04-26 DIAGNOSIS — Z89512 Acquired absence of left leg below knee: Secondary | ICD-10-CM
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
## 2023-04-28 ENCOUNTER — Telehealth: Payer: Self-pay | Admitting: Orthopedic Surgery

## 2023-04-28 NOTE — Telephone Encounter (Signed)
Received call from Kelly-(PT) with [facility]  needing verbal orders for HHPT 2 Wk 3 and 1 Wk 5. The number to contact patient is [phone]

## 2023-04-28 NOTE — Telephone Encounter (Signed)
LM on VM VO approval

## 2023-05-02 ENCOUNTER — Encounter
Payer: Medicare Other | Attending: Physical Medicine and Rehabilitation | Admitting: Physical Medicine and Rehabilitation

## 2023-05-02 DIAGNOSIS — I10 Essential (primary) hypertension: Secondary | ICD-10-CM | POA: Insufficient documentation

## 2023-05-02 DIAGNOSIS — G621 Alcoholic polyneuropathy: Secondary | ICD-10-CM | POA: Diagnosis present

## 2023-05-02 MED ORDER — GABAPENTIN 300 MG PO CAPS: 300.0000 mg | ORAL_CAPSULE | Freq: Three times a day (TID) | ORAL | 3 refills | Status: DC

## 2023-05-02 NOTE — Patient Instructions (Addendum)
Qutenza  B6/B12/methylfolate  Magnesium glycinate 500mg    Red light therapy
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
## 2023-05-04 ENCOUNTER — Telehealth: Payer: Self-pay | Admitting: *Deleted

## 2023-05-04 NOTE — Telephone Encounter (Signed)
Patient would like to know if the guard over knee amputation site comes in different sizes. He reports the guards keeps sliding off and wants to know if another size available?

## 2023-05-05 NOTE — Telephone Encounter (Signed)
Left detailed message to contact [facility] and if they need anything from our office we can provide.

## 2023-05-09 ENCOUNTER — Ambulatory Visit (INDEPENDENT_AMBULATORY_CARE_PROVIDER_SITE_OTHER): Payer: Medicare Other | Admitting: Orthopedic Surgery

## 2023-05-09 ENCOUNTER — Encounter: Payer: Self-pay | Admitting: Orthopedic Surgery

## 2023-05-09 DIAGNOSIS — Z89512 Acquired absence of left leg below knee: Secondary | ICD-10-CM
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
## 2023-05-10 ENCOUNTER — Encounter: Payer: Self-pay | Admitting: Orthopedic Surgery

## 2023-05-13 ENCOUNTER — Other Ambulatory Visit: Payer: Self-pay | Admitting: *Deleted

## 2023-05-16 ENCOUNTER — Encounter: Payer: Self-pay | Admitting: Physical Medicine and Rehabilitation

## 2023-05-17 ENCOUNTER — Ambulatory Visit (INDEPENDENT_AMBULATORY_CARE_PROVIDER_SITE_OTHER): Payer: Medicare Other | Admitting: Orthopedic Surgery

## 2023-05-17 ENCOUNTER — Encounter: Payer: Self-pay | Admitting: Orthopedic Surgery

## 2023-05-17 DIAGNOSIS — Z89512 Acquired absence of left leg below knee: Secondary | ICD-10-CM

## 2023-05-17 MED ORDER — DOXYCYCLINE HYCLATE 100 MG PO TABS: 100.0000 mg | ORAL_TABLET | Freq: Two times a day (BID) | ORAL | 0 refills | Status: DC
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
## 2023-05-19 ENCOUNTER — Encounter
Payer: Medicare Other | Attending: Physical Medicine and Rehabilitation | Admitting: Physical Medicine and Rehabilitation

## 2023-05-19 DIAGNOSIS — Z89512 Acquired absence of left leg below knee: Secondary | ICD-10-CM

## 2023-05-19 DIAGNOSIS — G546 Phantom limb syndrome with pain: Secondary | ICD-10-CM
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
## 2023-05-23 ENCOUNTER — Encounter: Payer: Medicare Other | Admitting: Orthopedic Surgery

## 2023-05-30 ENCOUNTER — Encounter: Payer: Self-pay | Admitting: Orthopedic Surgery

## 2023-05-30 ENCOUNTER — Ambulatory Visit: Payer: Medicare Other | Admitting: Orthopedic Surgery

## 2023-05-30 DIAGNOSIS — Z89512 Acquired absence of left leg below knee: Secondary | ICD-10-CM
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
## 2023-06-21 ENCOUNTER — Ambulatory Visit (INDEPENDENT_AMBULATORY_CARE_PROVIDER_SITE_OTHER): Payer: Medicare Other | Admitting: Orthopedic Surgery

## 2023-06-21 DIAGNOSIS — Z89512 Acquired absence of left leg below knee: Secondary | ICD-10-CM

## 2023-06-28 ENCOUNTER — Encounter: Payer: Self-pay | Admitting: Orthopedic Surgery
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
## 2023-07-11 DIAGNOSIS — Z4781 Encounter for orthopedic aftercare following surgical amputation: Secondary | ICD-10-CM | POA: Diagnosis not present

## 2023-07-11 DIAGNOSIS — D649 Anemia, unspecified: Secondary | ICD-10-CM

## 2023-07-11 DIAGNOSIS — F4322 Adjustment disorder with anxiety: Secondary | ICD-10-CM

## 2023-07-11 DIAGNOSIS — I5042 Chronic combined systolic (congestive) and diastolic (congestive) heart failure: Secondary | ICD-10-CM | POA: Diagnosis not present

## 2023-07-11 DIAGNOSIS — F10239 Alcohol dependence with withdrawal, unspecified: Secondary | ICD-10-CM

## 2023-07-11 DIAGNOSIS — G4733 Obstructive sleep apnea (adult) (pediatric): Secondary | ICD-10-CM

## 2023-07-11 DIAGNOSIS — Z89512 Acquired absence of left leg below knee: Secondary | ICD-10-CM | POA: Diagnosis not present

## 2023-07-11 DIAGNOSIS — E1142 Type 2 diabetes mellitus with diabetic polyneuropathy: Secondary | ICD-10-CM

## 2023-07-11 DIAGNOSIS — Z6835 Body mass index (BMI) 35.0-35.9, adult: Secondary | ICD-10-CM

## 2023-07-11 DIAGNOSIS — E785 Hyperlipidemia, unspecified: Secondary | ICD-10-CM

## 2023-07-11 DIAGNOSIS — I110 Hypertensive heart disease with heart failure: Secondary | ICD-10-CM | POA: Diagnosis not present

## 2023-07-16 ENCOUNTER — Other Ambulatory Visit: Payer: Self-pay | Admitting: Internal Medicine

## 2023-07-19 ENCOUNTER — Ambulatory Visit: Payer: Medicare Other | Admitting: Orthopedic Surgery

## 2023-07-21 ENCOUNTER — Encounter: Payer: Self-pay | Admitting: Internal Medicine

## 2023-07-21 NOTE — Patient Instructions (Addendum)
      Blood work was ordered.   The lab is on the first floor.    Medications changes include :   restart cymbalta ( duloxetine) 30 mg daily for depression.  Restart acamprosate twice daily.  Start rybelsus 3 mg daily.        Return in about 6 months (around 01/20/2024) for follow up.

## 2023-07-21 NOTE — Progress Notes (Signed)
Subjective:    Patient ID: [name], male    DOB: March 26, 1961, 62 y.o.   MRN: [id number]     HPI [name] is here for follow up of his chronic medical problems.  B/l ears feel clogged.    Has drank at times.  Would like to go back on the Campral to help with the craving  Medications and allergies reviewed with patient and updated if appropriate.  Current Outpatient Medications on File Prior to Visit  Medication Sig Dispense Refill   acetaminophen (TYLENOL) 325 MG tablet Take 2 tablets (650 mg total) by mouth every 6 (six) hours as needed for mild pain (or Fever >/= 101).     amLODipine (NORVASC) 2.5 MG tablet Take 1 tablet (2.5 mg total) by mouth daily. 30 tablet 0   ascorbic acid (VITAMIN C) 1000 MG tablet Take 1 tablet (1,000 mg total) by mouth daily. 100 tablet 0   atorvastatin (LIPITOR) 80 MG tablet Take 1 tablet (80 mg total) by mouth daily. 30 tablet 0   cholecalciferol (VITAMIN D3) 25 MCG (1000 UNIT) tablet Take 2 tablets (2,000 Units total) by mouth daily. 180 tablet 0   Magnesium 400 MG TABS Take 400 mg by mouth daily. 90 tablet 1   metoprolol succinate (TOPROL-XL) 25 MG 24 hr tablet Take 1 tablet (25 mg total) by mouth daily. 30 tablet 0   nitroGLYCERIN (NITROSTAT) 0.4 MG SL tablet Place 1 tablet (0.4 mg total) under the tongue every 5 (five) minutes x 3 doses as needed for chest pain. 15 tablet 1   thiamine (VITAMIN B1) 100 MG tablet Take 1 tablet (100 mg total) by mouth daily. 100 tablet 1   vitamin B-12 (CYANOCOBALAMIN) 100 MCG tablet TAKE 1 TABLET BY MOUTH EVERY DAY 100 tablet 1   furosemide (LASIX) 40 MG tablet Take 1 tablet (40 mg total) by mouth daily as needed. 90 tablet 1   No current facility-administered medications on file prior to visit.     Review of Systems  Constitutional:  Negative for fever.  Respiratory:  Positive for cough (in morning). Negative for shortness of breath and wheezing.   Cardiovascular:  Negative for chest pain, palpitations and  leg swelling.  Gastrointestinal:  Negative for abdominal pain, blood in stool, constipation and diarrhea.       No gerd  Neurological:  Positive for headaches (occ). Negative for light-headedness.  Psychiatric/Behavioral:  Positive for dysphoric mood. Negative for sleep disturbance. The patient is nervous/anxious.        Objective:   Vitals:   07/22/23 1029 07/22/23 1118  BP: (!) 140/78 134/80  Pulse: 70   Temp: 98 F (36.7 C)   SpO2: 93%    BP Readings from Last 3 Encounters:  07/22/23 134/80  04/21/23 128/65  04/08/23 (!) 142/81   Wt Readings from Last 3 Encounters:  04/21/23 264 lb 5.3 oz (119.9 kg)  04/05/23 273 lb 9.5 oz (124.1 kg)  02/17/23 278 lb 6.4 oz (126.3 kg)   There is no height or weight on file to calculate BMI.    Physical Exam Constitutional:      General: He is not in acute distress.    Appearance: Normal appearance. He is not ill-appearing.  HENT:     Head: Normocephalic and atraumatic.     Right Ear: Tympanic membrane and ear canal normal. There is no impacted cerumen.     Left Ear: Tympanic membrane and ear canal normal. There is no impacted cerumen.  Eyes:     Conjunctiva/sclera: Conjunctivae normal.  Cardiovascular:     Rate and Rhythm: Normal rate and regular rhythm.     Heart sounds: Normal heart sounds.  Pulmonary:     Effort: Pulmonary effort is normal. No respiratory distress.     Breath sounds: Normal breath sounds. No wheezing or rales.  Musculoskeletal:     Right lower leg: Edema present.     Comments: S/p L BKA  Skin:    General: Skin is warm and dry.     Findings: No rash.  Neurological:     Mental Status: He is alert. Mental status is at baseline.  Psychiatric:        Mood and Affect: Mood normal.       Diabetic Foot Exam - Simple   Simple Foot Form Diabetic Foot exam was performed with the following findings: Yes 07/22/2023 12:52 PM  Visual Inspection See comments: Yes Sensation Testing See comments: Yes Pulse  Check See comments: Yes Comments Right foot only-s/p BKA Right foot status post amputation of second toe.  Dry skin.  Hammertoe of fifth toe with slight erythema top lateral aspect of toe, mild callus formation.  No open wounds, lesions.  Decree sensation to light touch secondary to not known neuropathy      Lab Results  Component Value Date   WBC 4.7 04/11/2023   HGB 8.1 (L) 04/11/2023   HCT 25.1 (L) 04/11/2023   PLT 280 04/11/2023   GLUCOSE 110 (H) 04/11/2023   CHOL 118 06/22/2022   TRIG 51.0 06/22/2022   HDL 35.60 (L) 06/22/2022   LDLCALC 72 06/22/2022   ALT 18 04/11/2023   AST 21 04/11/2023   NA 134 (L) 04/11/2023   K 3.8 04/11/2023   CL 100 04/11/2023   CREATININE 0.68 04/18/2023   BUN 15 04/11/2023   CO2 26 04/11/2023   TSH 3.05 08/11/2022   PSA 0.21 02/01/2018   INR 1.2 04/01/2023   HGBA1C 5.4 03/31/2023   MICROALBUR <0.7 06/22/2022     Assessment & Plan:    See Problem List for Assessment and Plan of chronic medical problems.

## 2023-07-22 ENCOUNTER — Ambulatory Visit (INDEPENDENT_AMBULATORY_CARE_PROVIDER_SITE_OTHER): Payer: Medicare Other | Admitting: Internal Medicine

## 2023-07-22 VITALS — BP 134/80 | HR 70 | Temp 98.0°F

## 2023-07-22 DIAGNOSIS — Z125 Encounter for screening for malignant neoplasm of prostate: Secondary | ICD-10-CM | POA: Diagnosis not present

## 2023-07-22 DIAGNOSIS — G621 Alcoholic polyneuropathy: Secondary | ICD-10-CM

## 2023-07-22 DIAGNOSIS — D638 Anemia in other chronic diseases classified elsewhere: Secondary | ICD-10-CM

## 2023-07-22 DIAGNOSIS — K7030 Alcoholic cirrhosis of liver without ascites: Secondary | ICD-10-CM

## 2023-07-22 DIAGNOSIS — F1010 Alcohol abuse, uncomplicated: Secondary | ICD-10-CM

## 2023-07-22 DIAGNOSIS — E1151 Type 2 diabetes mellitus with diabetic peripheral angiopathy without gangrene: Secondary | ICD-10-CM

## 2023-07-22 DIAGNOSIS — E782 Mixed hyperlipidemia: Secondary | ICD-10-CM | POA: Diagnosis not present

## 2023-07-22 DIAGNOSIS — E039 Hypothyroidism, unspecified: Secondary | ICD-10-CM | POA: Diagnosis not present

## 2023-07-22 DIAGNOSIS — F419 Anxiety disorder, unspecified: Secondary | ICD-10-CM

## 2023-07-22 DIAGNOSIS — I5042 Chronic combined systolic (congestive) and diastolic (congestive) heart failure: Secondary | ICD-10-CM | POA: Diagnosis not present

## 2023-07-22 DIAGNOSIS — I10 Essential (primary) hypertension: Secondary | ICD-10-CM | POA: Diagnosis not present

## 2023-07-22 LAB — CBC WITH DIFFERENTIAL/PLATELET
Basophils Absolute: 0 10*3/uL (ref 0.0–0.1)
Basophils Relative: 0.5 % (ref 0.0–3.0)
Eosinophils Absolute: 0.1 10*3/uL (ref 0.0–0.7)
Eosinophils Relative: 2.1 % (ref 0.0–5.0)
HCT: 37.7 % — ABNORMAL LOW (ref 39.0–52.0)
Hemoglobin: 12.2 g/dL — ABNORMAL LOW (ref 13.0–17.0)
Lymphocytes Relative: 25.7 % (ref 12.0–46.0)
Lymphs Abs: 1.1 10*3/uL (ref 0.7–4.0)
MCHC: 32.3 g/dL (ref 30.0–36.0)
MCV: 78.4 fL (ref 78.0–100.0)
Monocytes Absolute: 0.4 10*3/uL (ref 0.1–1.0)
Monocytes Relative: 10.5 % (ref 3.0–12.0)
Neutro Abs: 2.5 10*3/uL (ref 1.4–7.7)
Neutrophils Relative %: 61.2 % (ref 43.0–77.0)
Platelets: 177 10*3/uL (ref 150.0–400.0)
RBC: 4.81 Mil/uL (ref 4.22–5.81)
RDW: 21.5 % — ABNORMAL HIGH (ref 11.5–15.5)
WBC: 4.1 10*3/uL (ref 4.0–10.5)

## 2023-07-22 LAB — HEMOGLOBIN A1C: Hgb A1c MFr Bld: 5.9 % (ref 4.6–6.5)

## 2023-07-22 LAB — COMPREHENSIVE METABOLIC PANEL
ALT: 29 U/L (ref 0–53)
AST: 30 U/L (ref 0–37)
Albumin: 4 g/dL (ref 3.5–5.2)
Alkaline Phosphatase: 161 U/L — ABNORMAL HIGH (ref 39–117)
BUN: 21 mg/dL (ref 6–23)
CO2: 28 meq/L (ref 19–32)
Calcium: 9.4 mg/dL (ref 8.4–10.5)
Chloride: 102 meq/L (ref 96–112)
Creatinine, Ser: 0.71 mg/dL (ref 0.40–1.50)
GFR: 98.68 mL/min (ref >60.00)
Glucose, Bld: 144 mg/dL — ABNORMAL HIGH (ref 70–99)
Potassium: 3.7 meq/L (ref 3.5–5.1)
Sodium: 141 meq/L (ref 135–145)
Total Bilirubin: 0.8 mg/dL (ref 0.2–1.2)
Total Protein: 7.2 g/dL (ref 6.0–8.3)

## 2023-07-22 LAB — TSH: TSH: 1.63 u[IU]/mL (ref 0.35–5.50)

## 2023-07-22 LAB — IBC PANEL
Iron: 39 ug/dL — ABNORMAL LOW (ref 42–165)
Saturation Ratios: 7.7 % — ABNORMAL LOW (ref 20.0–50.0)
TIBC: 508.2 ug/dL — ABNORMAL HIGH (ref 250.0–450.0)
Transferrin: 363 mg/dL — ABNORMAL HIGH (ref 212.0–360.0)

## 2023-07-22 LAB — LIPID PANEL
Cholesterol: 126 mg/dL (ref 0–200)
HDL: 50.4 mg/dL (ref >39.00)
LDL Cholesterol: 58 mg/dL (ref 0–99)
NonHDL: 75.83
Total CHOL/HDL Ratio: 3
Triglycerides: 87 mg/dL (ref 0.0–149.0)
VLDL: 17.4 mg/dL (ref 0.0–40.0)

## 2023-07-22 LAB — VITAMIN D 25 HYDROXY (VIT D DEFICIENCY, FRACTURES): VITD: 45.14 ng/mL (ref 30.00–100.00)

## 2023-07-22 LAB — FERRITIN: Ferritin: 13.6 ng/mL — ABNORMAL LOW (ref 22.0–322.0)

## 2023-07-22 LAB — PSA, MEDICARE: PSA: 0.86 ng/mL (ref 0.10–4.00)

## 2023-07-22 LAB — MAGNESIUM: Magnesium: 1.7 mg/dL (ref 1.5–2.5)

## 2023-07-22 LAB — MICROALBUMIN / CREATININE URINE RATIO
Creatinine,U: 90.4 mg/dL
Microalb Creat Ratio: 1.3 mg/g (ref 0.0–30.0)
Microalb, Ur: 1.2 mg/dL (ref 0.0–1.9)

## 2023-07-22 LAB — VITAMIN B12: Vitamin B-12: >1501 pg/mL — ABNORMAL HIGH (ref 211–911)

## 2023-07-22 MED ORDER — GABAPENTIN 300 MG PO CAPS: 600.0000 mg | ORAL_CAPSULE | Freq: Three times a day (TID) | ORAL | 5 refills | Status: DC

## 2023-07-22 MED ORDER — ACAMPROSATE CALCIUM 333 MG PO TBEC: 333.0000 mg | DELAYED_RELEASE_TABLET | Freq: Two times a day (BID) | ORAL | 1 refills | Status: DC

## 2023-07-22 MED ORDER — RYBELSUS 3 MG PO TABS
3.0000 mg | ORAL_TABLET | Freq: Every day | ORAL | 0 refills | Status: DC
Start: 2023-07-22 — End: 2024-01-20

## 2023-07-22 MED ORDER — DULOXETINE HCL 30 MG PO CPEP
30.0000 mg | ORAL_CAPSULE | Freq: Every day | ORAL | 1 refills | Status: DC
Start: 2023-07-22 — End: 2024-01-20

## 2023-07-22 NOTE — Assessment & Plan Note (Signed)
Chronic Blood pressure initially elevated,-better on repeat Continue current medication-amlodipine 2.5 mg daily, metoprolol XL 25 mg daily CBC, CMP

## 2023-07-22 NOTE — Assessment & Plan Note (Addendum)
Chronic Lab Results  Component Value Date   HGBA1C 5.4 03/31/2023   Stressed low sugar/carbohydrate diet Not able to exercise currently Check A1c Would like a little help with keeping his sugars controlled and keeping his weight down Start Rybelsus 3 mg daily-can titrate as tolerated-discussed that we can consider the injectables if needed

## 2023-07-22 NOTE — Assessment & Plan Note (Addendum)
Chronic Euvolemic Continue Lasix 40 mg daily as needed-has not taken in a while

## 2023-07-22 NOTE — Assessment & Plan Note (Addendum)
Chronic Currently not taking Campral, but would like to restart Admits he has had some alcoholic drinks and would like to get back on the Campral which did help Restart Campral 333 mg twice daily

## 2023-07-22 NOTE — Assessment & Plan Note (Signed)
Chronic Regular exercise and healthy diet encouraged Check lipid panel  Continue atorvastatin 80 mg daily 

## 2023-07-22 NOTE — Assessment & Plan Note (Addendum)
Chronic Not currently drinking alcohol CMP,cbc Stressed alcohol cessation-restart Campral

## 2023-07-22 NOTE — Assessment & Plan Note (Signed)
Chronic  Clinically euthyroid Check tsh  Currently no medication, but was on levothyroxine at 1 point

## 2023-07-22 NOTE — Assessment & Plan Note (Signed)
Low calcium on blood work CMP, vitamin D level

## 2023-07-22 NOTE — Assessment & Plan Note (Addendum)
Chronic Worse after surgery earlier this year so may have a new iron deficiency element Cbc, iron panel

## 2023-07-22 NOTE — Assessment & Plan Note (Addendum)
Chronic Confirmed by [facility] in nature Not drinking alcohol currently, but has had it intermittently Continue B12, thiamine supplementation Currently on gabapentin 600 mg 3 times daily

## 2023-07-22 NOTE — Assessment & Plan Note (Signed)
Low magnesium on previous blood work CMP, vitamin D, magnesium level

## 2023-07-22 NOTE — Assessment & Plan Note (Addendum)
Chronic Not currently taking anything and does feel anxious and a little depressed Restart Cymbalta but at 30 mg daily

## 2023-07-25 ENCOUNTER — Encounter: Payer: Medicare Other | Admitting: Physical Therapy

## 2023-08-01 NOTE — Telephone Encounter (Signed)
Started in error

## 2023-08-04 ENCOUNTER — Ambulatory Visit: Payer: Medicare Other | Admitting: Orthopedic Surgery

## 2023-08-04 ENCOUNTER — Encounter: Payer: Self-pay | Admitting: Orthopedic Surgery

## 2023-08-04 DIAGNOSIS — Z89512 Acquired absence of left leg below knee: Secondary | ICD-10-CM
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
## 2023-08-05 ENCOUNTER — Telehealth: Payer: Self-pay | Admitting: Pharmacy Technician

## 2023-08-05 ENCOUNTER — Other Ambulatory Visit (HOSPITAL_COMMUNITY): Payer: Self-pay

## 2023-08-05 NOTE — Telephone Encounter (Signed)
Pharmacy Patient Advocate Encounter  Received notification from [facility] that Prior Authorization for rybelsus has been APPROVED from 10/18/22 to 10/18/23   PA #/Case ID/Reference #: [id number]

## 2023-08-05 NOTE — Telephone Encounter (Signed)
Pharmacy Patient Advocate Encounter   Received notification from CoverMyMeds that prior authorization for rybelsus is required/requested.   Insurance verification completed.   The patient is insured through [facility] .   Per test claim: PA required; PA submitted to [facility] via CoverMyMeds Key/confirmation #/EOC [id number] Status is pending

## 2023-08-09 ENCOUNTER — Other Ambulatory Visit: Payer: Self-pay | Admitting: Internal Medicine

## 2023-08-12 ENCOUNTER — Other Ambulatory Visit: Payer: Self-pay | Admitting: Internal Medicine

## 2023-08-25 ENCOUNTER — Encounter: Payer: Self-pay | Admitting: Physical Therapy

## 2023-08-25 ENCOUNTER — Ambulatory Visit: Payer: Medicare Other | Admitting: Physical Therapy

## 2023-08-25 ENCOUNTER — Other Ambulatory Visit: Payer: Self-pay

## 2023-08-25 DIAGNOSIS — R293 Abnormal posture: Secondary | ICD-10-CM

## 2023-08-25 DIAGNOSIS — R2681 Unsteadiness on feet: Secondary | ICD-10-CM | POA: Diagnosis not present

## 2023-08-25 DIAGNOSIS — R2689 Other abnormalities of gait and mobility: Secondary | ICD-10-CM

## 2023-08-25 DIAGNOSIS — M6281 Muscle weakness (generalized): Secondary | ICD-10-CM

## 2023-08-25 DIAGNOSIS — Z7409 Other reduced mobility: Secondary | ICD-10-CM | POA: Diagnosis not present
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
## 2023-08-29 ENCOUNTER — Encounter: Payer: Self-pay | Admitting: Physical Therapy

## 2023-08-29 ENCOUNTER — Ambulatory Visit (INDEPENDENT_AMBULATORY_CARE_PROVIDER_SITE_OTHER): Payer: Medicare Other | Admitting: Physical Therapy

## 2023-08-29 DIAGNOSIS — R2681 Unsteadiness on feet: Secondary | ICD-10-CM

## 2023-08-29 DIAGNOSIS — R293 Abnormal posture: Secondary | ICD-10-CM

## 2023-08-29 DIAGNOSIS — M6281 Muscle weakness (generalized): Secondary | ICD-10-CM | POA: Diagnosis not present

## 2023-08-29 DIAGNOSIS — Z7409 Other reduced mobility: Secondary | ICD-10-CM | POA: Diagnosis not present

## 2023-08-29 DIAGNOSIS — R2689 Other abnormalities of gait and mobility: Secondary | ICD-10-CM
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
## 2023-08-30 ENCOUNTER — Other Ambulatory Visit: Payer: Self-pay | Admitting: Internal Medicine

## 2023-09-01 ENCOUNTER — Encounter: Payer: Self-pay | Admitting: Physical Therapy

## 2023-09-01 ENCOUNTER — Ambulatory Visit: Payer: Medicare Other | Admitting: Physical Therapy

## 2023-09-01 DIAGNOSIS — R2689 Other abnormalities of gait and mobility: Secondary | ICD-10-CM | POA: Diagnosis not present

## 2023-09-01 DIAGNOSIS — R2681 Unsteadiness on feet: Secondary | ICD-10-CM | POA: Diagnosis not present

## 2023-09-01 DIAGNOSIS — R293 Abnormal posture: Secondary | ICD-10-CM

## 2023-09-01 DIAGNOSIS — Z7409 Other reduced mobility: Secondary | ICD-10-CM | POA: Diagnosis not present

## 2023-09-01 DIAGNOSIS — M6281 Muscle weakness (generalized): Secondary | ICD-10-CM
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
## 2023-09-05 ENCOUNTER — Encounter: Payer: Self-pay | Admitting: Physical Therapy

## 2023-09-05 ENCOUNTER — Ambulatory Visit (INDEPENDENT_AMBULATORY_CARE_PROVIDER_SITE_OTHER): Payer: Medicare Other | Admitting: Physical Therapy

## 2023-09-05 DIAGNOSIS — M6281 Muscle weakness (generalized): Secondary | ICD-10-CM

## 2023-09-05 DIAGNOSIS — Z7409 Other reduced mobility: Secondary | ICD-10-CM | POA: Diagnosis not present

## 2023-09-05 DIAGNOSIS — R293 Abnormal posture: Secondary | ICD-10-CM

## 2023-09-05 DIAGNOSIS — R2681 Unsteadiness on feet: Secondary | ICD-10-CM

## 2023-09-05 DIAGNOSIS — R2689 Other abnormalities of gait and mobility: Secondary | ICD-10-CM | POA: Diagnosis not present
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
## 2023-09-07 ENCOUNTER — Encounter: Payer: Self-pay | Admitting: Physical Therapy

## 2023-09-07 ENCOUNTER — Encounter: Payer: Medicare Other | Admitting: Physical Therapy

## 2023-09-07 ENCOUNTER — Ambulatory Visit (INDEPENDENT_AMBULATORY_CARE_PROVIDER_SITE_OTHER): Payer: Medicare Other | Admitting: Physical Therapy

## 2023-09-07 DIAGNOSIS — Z7409 Other reduced mobility: Secondary | ICD-10-CM | POA: Diagnosis not present

## 2023-09-07 DIAGNOSIS — R293 Abnormal posture: Secondary | ICD-10-CM

## 2023-09-07 DIAGNOSIS — R2689 Other abnormalities of gait and mobility: Secondary | ICD-10-CM

## 2023-09-07 DIAGNOSIS — R2681 Unsteadiness on feet: Secondary | ICD-10-CM | POA: Diagnosis not present

## 2023-09-07 DIAGNOSIS — M6281 Muscle weakness (generalized): Secondary | ICD-10-CM
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
## 2023-09-12 ENCOUNTER — Ambulatory Visit (INDEPENDENT_AMBULATORY_CARE_PROVIDER_SITE_OTHER): Payer: Medicare Other | Admitting: Physical Therapy

## 2023-09-12 ENCOUNTER — Encounter: Payer: Medicare Other | Admitting: Physical Therapy

## 2023-09-12 ENCOUNTER — Encounter: Payer: Self-pay | Admitting: Physical Therapy

## 2023-09-12 DIAGNOSIS — M6281 Muscle weakness (generalized): Secondary | ICD-10-CM

## 2023-09-12 DIAGNOSIS — R2689 Other abnormalities of gait and mobility: Secondary | ICD-10-CM

## 2023-09-12 DIAGNOSIS — R293 Abnormal posture: Secondary | ICD-10-CM

## 2023-09-12 DIAGNOSIS — Z7409 Other reduced mobility: Secondary | ICD-10-CM | POA: Diagnosis not present

## 2023-09-12 DIAGNOSIS — R2681 Unsteadiness on feet: Secondary | ICD-10-CM | POA: Diagnosis not present
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
## 2023-09-13 ENCOUNTER — Encounter: Payer: Medicare Other | Admitting: Physical Therapy

## 2023-09-14 ENCOUNTER — Encounter: Payer: Self-pay | Admitting: Physical Therapy

## 2023-09-14 ENCOUNTER — Ambulatory Visit: Payer: Medicare Other | Admitting: Physical Therapy

## 2023-09-14 DIAGNOSIS — R293 Abnormal posture: Secondary | ICD-10-CM

## 2023-09-14 DIAGNOSIS — R2689 Other abnormalities of gait and mobility: Secondary | ICD-10-CM

## 2023-09-14 DIAGNOSIS — R2681 Unsteadiness on feet: Secondary | ICD-10-CM | POA: Diagnosis not present

## 2023-09-14 DIAGNOSIS — Z7409 Other reduced mobility: Secondary | ICD-10-CM | POA: Diagnosis not present

## 2023-09-14 DIAGNOSIS — M6281 Muscle weakness (generalized): Secondary | ICD-10-CM
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
## 2023-09-19 ENCOUNTER — Ambulatory Visit (INDEPENDENT_AMBULATORY_CARE_PROVIDER_SITE_OTHER): Payer: Medicare Other | Admitting: Physical Therapy

## 2023-09-19 ENCOUNTER — Encounter: Payer: Self-pay | Admitting: Physical Therapy

## 2023-09-19 DIAGNOSIS — R2681 Unsteadiness on feet: Secondary | ICD-10-CM

## 2023-09-19 DIAGNOSIS — M6281 Muscle weakness (generalized): Secondary | ICD-10-CM | POA: Diagnosis not present

## 2023-09-19 DIAGNOSIS — R2689 Other abnormalities of gait and mobility: Secondary | ICD-10-CM

## 2023-09-19 DIAGNOSIS — Z7409 Other reduced mobility: Secondary | ICD-10-CM | POA: Diagnosis not present

## 2023-09-19 DIAGNOSIS — R293 Abnormal posture: Secondary | ICD-10-CM
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
## 2023-09-20 ENCOUNTER — Encounter: Payer: Self-pay | Admitting: Internal Medicine

## 2023-09-21 ENCOUNTER — Ambulatory Visit (INDEPENDENT_AMBULATORY_CARE_PROVIDER_SITE_OTHER): Payer: Medicare Other | Admitting: Physical Therapy

## 2023-09-21 ENCOUNTER — Encounter: Payer: Self-pay | Admitting: Physical Therapy

## 2023-09-21 ENCOUNTER — Other Ambulatory Visit: Payer: Self-pay

## 2023-09-21 ENCOUNTER — Other Ambulatory Visit: Payer: Self-pay | Admitting: Internal Medicine

## 2023-09-21 DIAGNOSIS — R2681 Unsteadiness on feet: Secondary | ICD-10-CM | POA: Diagnosis not present

## 2023-09-21 DIAGNOSIS — R293 Abnormal posture: Secondary | ICD-10-CM

## 2023-09-21 DIAGNOSIS — Z7409 Other reduced mobility: Secondary | ICD-10-CM

## 2023-09-21 DIAGNOSIS — M6281 Muscle weakness (generalized): Secondary | ICD-10-CM

## 2023-09-21 DIAGNOSIS — R2689 Other abnormalities of gait and mobility: Secondary | ICD-10-CM | POA: Diagnosis not present

## 2023-09-21 MED ORDER — SERTRALINE HCL 50 MG PO TABS: 50.0000 mg | ORAL_TABLET | Freq: Every day | ORAL | 1 refills | Status: DC

## 2023-09-21 MED ORDER — ACAMPROSATE CALCIUM 333 MG PO TBEC: 333.0000 mg | DELAYED_RELEASE_TABLET | Freq: Two times a day (BID) | ORAL | 1 refills | Status: DC
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
## 2023-09-26 ENCOUNTER — Ambulatory Visit (INDEPENDENT_AMBULATORY_CARE_PROVIDER_SITE_OTHER): Payer: Medicare Other | Admitting: Physical Therapy

## 2023-09-26 ENCOUNTER — Encounter: Payer: Self-pay | Admitting: Physical Therapy

## 2023-09-26 DIAGNOSIS — R293 Abnormal posture: Secondary | ICD-10-CM

## 2023-09-26 DIAGNOSIS — R2681 Unsteadiness on feet: Secondary | ICD-10-CM

## 2023-09-26 DIAGNOSIS — Z7409 Other reduced mobility: Secondary | ICD-10-CM | POA: Diagnosis not present

## 2023-09-26 DIAGNOSIS — M6281 Muscle weakness (generalized): Secondary | ICD-10-CM

## 2023-09-26 DIAGNOSIS — R2689 Other abnormalities of gait and mobility: Secondary | ICD-10-CM
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
## 2023-09-28 ENCOUNTER — Encounter: Payer: Medicare Other | Admitting: Physical Therapy

## 2023-09-29 ENCOUNTER — Ambulatory Visit: Payer: Medicare Other | Admitting: Physical Therapy

## 2023-09-29 ENCOUNTER — Encounter: Payer: Self-pay | Admitting: Physical Therapy

## 2023-09-29 DIAGNOSIS — Z7409 Other reduced mobility: Secondary | ICD-10-CM | POA: Diagnosis not present

## 2023-09-29 DIAGNOSIS — R2689 Other abnormalities of gait and mobility: Secondary | ICD-10-CM

## 2023-09-29 DIAGNOSIS — M6281 Muscle weakness (generalized): Secondary | ICD-10-CM | POA: Diagnosis not present

## 2023-09-29 DIAGNOSIS — R2681 Unsteadiness on feet: Secondary | ICD-10-CM | POA: Diagnosis not present

## 2023-09-29 DIAGNOSIS — R293 Abnormal posture: Secondary | ICD-10-CM
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
## 2023-10-03 ENCOUNTER — Encounter: Payer: Self-pay | Admitting: Physical Therapy

## 2023-10-03 ENCOUNTER — Ambulatory Visit (INDEPENDENT_AMBULATORY_CARE_PROVIDER_SITE_OTHER): Payer: Medicare Other | Admitting: Physical Therapy

## 2023-10-03 DIAGNOSIS — Z7409 Other reduced mobility: Secondary | ICD-10-CM

## 2023-10-03 DIAGNOSIS — M6281 Muscle weakness (generalized): Secondary | ICD-10-CM

## 2023-10-03 DIAGNOSIS — R293 Abnormal posture: Secondary | ICD-10-CM

## 2023-10-03 DIAGNOSIS — R2689 Other abnormalities of gait and mobility: Secondary | ICD-10-CM | POA: Diagnosis not present

## 2023-10-03 DIAGNOSIS — R2681 Unsteadiness on feet: Secondary | ICD-10-CM | POA: Diagnosis not present
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
## 2023-10-04 ENCOUNTER — Ambulatory Visit: Payer: Medicare Other | Admitting: Orthopedic Surgery

## 2023-10-05 ENCOUNTER — Encounter: Payer: Medicare Other | Admitting: Physical Therapy

## 2023-10-10 ENCOUNTER — Encounter: Payer: Medicare Other | Admitting: Physical Therapy

## 2023-10-18 ENCOUNTER — Encounter: Payer: Medicare Other | Admitting: Physical Therapy

## 2023-10-20 ENCOUNTER — Encounter: Payer: Medicare Other | Admitting: Physical Therapy

## 2023-10-24 ENCOUNTER — Encounter: Payer: Self-pay | Admitting: Physical Therapy

## 2023-10-24 ENCOUNTER — Ambulatory Visit (INDEPENDENT_AMBULATORY_CARE_PROVIDER_SITE_OTHER): Payer: Medicare Other | Admitting: Physical Therapy

## 2023-10-24 ENCOUNTER — Encounter: Payer: Medicare Other | Admitting: Physical Therapy

## 2023-10-24 DIAGNOSIS — R293 Abnormal posture: Secondary | ICD-10-CM

## 2023-10-24 DIAGNOSIS — M6281 Muscle weakness (generalized): Secondary | ICD-10-CM | POA: Diagnosis not present

## 2023-10-24 DIAGNOSIS — R2689 Other abnormalities of gait and mobility: Secondary | ICD-10-CM

## 2023-10-24 DIAGNOSIS — Z7409 Other reduced mobility: Secondary | ICD-10-CM | POA: Diagnosis not present

## 2023-10-24 DIAGNOSIS — R2681 Unsteadiness on feet: Secondary | ICD-10-CM
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
## 2023-10-26 ENCOUNTER — Encounter: Payer: Medicare Other | Admitting: Physical Therapy

## 2023-10-27 ENCOUNTER — Encounter: Payer: Self-pay | Admitting: Physical Therapy

## 2023-10-27 ENCOUNTER — Ambulatory Visit (INDEPENDENT_AMBULATORY_CARE_PROVIDER_SITE_OTHER): Payer: Medicare Other | Admitting: Physical Therapy

## 2023-10-27 DIAGNOSIS — R2681 Unsteadiness on feet: Secondary | ICD-10-CM | POA: Diagnosis not present

## 2023-10-27 DIAGNOSIS — Z7409 Other reduced mobility: Secondary | ICD-10-CM

## 2023-10-27 DIAGNOSIS — R2689 Other abnormalities of gait and mobility: Secondary | ICD-10-CM

## 2023-10-27 DIAGNOSIS — M6281 Muscle weakness (generalized): Secondary | ICD-10-CM

## 2023-10-27 DIAGNOSIS — R293 Abnormal posture: Secondary | ICD-10-CM
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
## 2023-11-01 ENCOUNTER — Encounter: Payer: Self-pay | Admitting: Physical Therapy

## 2023-11-01 ENCOUNTER — Ambulatory Visit (INDEPENDENT_AMBULATORY_CARE_PROVIDER_SITE_OTHER): Payer: Medicare Other | Admitting: Physical Therapy

## 2023-11-01 DIAGNOSIS — Z7409 Other reduced mobility: Secondary | ICD-10-CM

## 2023-11-01 DIAGNOSIS — R2689 Other abnormalities of gait and mobility: Secondary | ICD-10-CM

## 2023-11-01 DIAGNOSIS — R2681 Unsteadiness on feet: Secondary | ICD-10-CM | POA: Diagnosis not present

## 2023-11-01 DIAGNOSIS — M6281 Muscle weakness (generalized): Secondary | ICD-10-CM

## 2023-11-01 DIAGNOSIS — R293 Abnormal posture: Secondary | ICD-10-CM
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
## 2023-11-03 ENCOUNTER — Ambulatory Visit: Payer: Medicare Other | Admitting: Physical Therapy

## 2023-11-03 ENCOUNTER — Encounter: Payer: Medicare Other | Admitting: Physical Therapy

## 2023-11-03 ENCOUNTER — Encounter: Payer: Self-pay | Admitting: Physical Therapy

## 2023-11-03 DIAGNOSIS — Z7409 Other reduced mobility: Secondary | ICD-10-CM

## 2023-11-03 DIAGNOSIS — R2689 Other abnormalities of gait and mobility: Secondary | ICD-10-CM

## 2023-11-03 DIAGNOSIS — R2681 Unsteadiness on feet: Secondary | ICD-10-CM | POA: Diagnosis not present

## 2023-11-03 DIAGNOSIS — M6281 Muscle weakness (generalized): Secondary | ICD-10-CM
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
## 2023-11-07 ENCOUNTER — Ambulatory Visit (INDEPENDENT_AMBULATORY_CARE_PROVIDER_SITE_OTHER): Payer: Medicare Other | Admitting: Physical Therapy

## 2023-11-07 ENCOUNTER — Encounter: Payer: Self-pay | Admitting: Physical Therapy

## 2023-11-07 DIAGNOSIS — R293 Abnormal posture: Secondary | ICD-10-CM

## 2023-11-07 DIAGNOSIS — R2681 Unsteadiness on feet: Secondary | ICD-10-CM

## 2023-11-07 DIAGNOSIS — R2689 Other abnormalities of gait and mobility: Secondary | ICD-10-CM | POA: Diagnosis not present

## 2023-11-07 DIAGNOSIS — Z7409 Other reduced mobility: Secondary | ICD-10-CM | POA: Diagnosis not present

## 2023-11-07 DIAGNOSIS — M6281 Muscle weakness (generalized): Secondary | ICD-10-CM
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
## 2023-11-09 ENCOUNTER — Ambulatory Visit (INDEPENDENT_AMBULATORY_CARE_PROVIDER_SITE_OTHER): Payer: Medicare Other | Admitting: Physical Therapy

## 2023-11-09 ENCOUNTER — Encounter: Payer: Self-pay | Admitting: Physical Therapy

## 2023-11-09 DIAGNOSIS — Z7409 Other reduced mobility: Secondary | ICD-10-CM | POA: Diagnosis not present

## 2023-11-09 DIAGNOSIS — R2681 Unsteadiness on feet: Secondary | ICD-10-CM

## 2023-11-09 DIAGNOSIS — R293 Abnormal posture: Secondary | ICD-10-CM

## 2023-11-09 DIAGNOSIS — R2689 Other abnormalities of gait and mobility: Secondary | ICD-10-CM | POA: Diagnosis not present

## 2023-11-09 DIAGNOSIS — M6281 Muscle weakness (generalized): Secondary | ICD-10-CM
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
## 2023-11-14 ENCOUNTER — Ambulatory Visit (INDEPENDENT_AMBULATORY_CARE_PROVIDER_SITE_OTHER): Payer: Medicare Other | Admitting: Physical Therapy

## 2023-11-14 ENCOUNTER — Encounter: Payer: Self-pay | Admitting: Physical Therapy

## 2023-11-14 DIAGNOSIS — R2681 Unsteadiness on feet: Secondary | ICD-10-CM | POA: Diagnosis not present

## 2023-11-14 DIAGNOSIS — Z7409 Other reduced mobility: Secondary | ICD-10-CM | POA: Diagnosis not present

## 2023-11-14 DIAGNOSIS — R2689 Other abnormalities of gait and mobility: Secondary | ICD-10-CM

## 2023-11-14 DIAGNOSIS — R293 Abnormal posture: Secondary | ICD-10-CM

## 2023-11-14 DIAGNOSIS — M6281 Muscle weakness (generalized): Secondary | ICD-10-CM | POA: Diagnosis not present
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
## 2023-11-16 ENCOUNTER — Ambulatory Visit (INDEPENDENT_AMBULATORY_CARE_PROVIDER_SITE_OTHER): Payer: Medicare Other | Admitting: Physical Therapy

## 2023-11-16 ENCOUNTER — Encounter: Payer: Self-pay | Admitting: Physical Therapy

## 2023-11-16 DIAGNOSIS — Z7409 Other reduced mobility: Secondary | ICD-10-CM

## 2023-11-16 DIAGNOSIS — R293 Abnormal posture: Secondary | ICD-10-CM

## 2023-11-16 DIAGNOSIS — R2681 Unsteadiness on feet: Secondary | ICD-10-CM | POA: Diagnosis not present

## 2023-11-16 DIAGNOSIS — M6281 Muscle weakness (generalized): Secondary | ICD-10-CM | POA: Diagnosis not present

## 2023-11-16 DIAGNOSIS — R2689 Other abnormalities of gait and mobility: Secondary | ICD-10-CM | POA: Diagnosis not present
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
## 2023-11-21 ENCOUNTER — Encounter: Payer: Self-pay | Admitting: Physical Therapy

## 2023-11-21 ENCOUNTER — Ambulatory Visit (INDEPENDENT_AMBULATORY_CARE_PROVIDER_SITE_OTHER): Payer: Medicare Other | Admitting: Physical Therapy

## 2023-11-21 DIAGNOSIS — M6281 Muscle weakness (generalized): Secondary | ICD-10-CM | POA: Diagnosis not present

## 2023-11-21 DIAGNOSIS — R293 Abnormal posture: Secondary | ICD-10-CM

## 2023-11-21 DIAGNOSIS — R2681 Unsteadiness on feet: Secondary | ICD-10-CM | POA: Diagnosis not present

## 2023-11-21 DIAGNOSIS — Z7409 Other reduced mobility: Secondary | ICD-10-CM | POA: Diagnosis not present

## 2023-11-21 DIAGNOSIS — R2689 Other abnormalities of gait and mobility: Secondary | ICD-10-CM | POA: Diagnosis not present
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
## 2023-11-23 ENCOUNTER — Encounter: Payer: Medicare Other | Admitting: Physical Therapy

## 2023-11-28 ENCOUNTER — Ambulatory Visit (INDEPENDENT_AMBULATORY_CARE_PROVIDER_SITE_OTHER): Payer: Medicare Other | Admitting: Physical Therapy

## 2023-11-28 ENCOUNTER — Encounter: Payer: Self-pay | Admitting: Physical Therapy

## 2023-11-28 DIAGNOSIS — Z7409 Other reduced mobility: Secondary | ICD-10-CM

## 2023-11-28 DIAGNOSIS — M6281 Muscle weakness (generalized): Secondary | ICD-10-CM | POA: Diagnosis not present

## 2023-11-28 DIAGNOSIS — R2689 Other abnormalities of gait and mobility: Secondary | ICD-10-CM | POA: Diagnosis not present

## 2023-11-28 DIAGNOSIS — R2681 Unsteadiness on feet: Secondary | ICD-10-CM | POA: Diagnosis not present

## 2023-11-28 DIAGNOSIS — R293 Abnormal posture: Secondary | ICD-10-CM
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
## 2023-12-06 ENCOUNTER — Encounter: Payer: Self-pay | Admitting: Physical Therapy

## 2023-12-06 ENCOUNTER — Ambulatory Visit (INDEPENDENT_AMBULATORY_CARE_PROVIDER_SITE_OTHER): Payer: Medicare Other | Admitting: Physical Therapy

## 2023-12-06 DIAGNOSIS — M6281 Muscle weakness (generalized): Secondary | ICD-10-CM

## 2023-12-06 DIAGNOSIS — R293 Abnormal posture: Secondary | ICD-10-CM | POA: Diagnosis not present

## 2023-12-06 DIAGNOSIS — R2681 Unsteadiness on feet: Secondary | ICD-10-CM

## 2023-12-06 DIAGNOSIS — R2689 Other abnormalities of gait and mobility: Secondary | ICD-10-CM

## 2023-12-06 DIAGNOSIS — Z7409 Other reduced mobility: Secondary | ICD-10-CM

## 2023-12-06 NOTE — Patient Instructions (Addendum)
 Increasing your activity level is important.  Short distances which is walking from one room to another. Work to increase frequency back to prior level.  Medium distances are entering & exiting your home or community with limited distances. Start with 4 medium walks which is one outing to one location and increase number of tolerated amounts per day.  Long distance is your highest tolerance for you. Walk until you feel you must rest. Back or leg pain or general fatigue are indicators to maximum tolerance. Monitor by distance or time. Try to walk your BEST distance 1-2 times per day. You should see this increase over time.
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
## 2023-12-13 ENCOUNTER — Encounter: Payer: Medicare Other | Admitting: Physical Therapy

## 2023-12-20 ENCOUNTER — Encounter: Payer: Self-pay | Admitting: Physical Therapy

## 2023-12-20 ENCOUNTER — Ambulatory Visit (INDEPENDENT_AMBULATORY_CARE_PROVIDER_SITE_OTHER): Payer: Medicare Other | Admitting: Physical Therapy

## 2023-12-20 DIAGNOSIS — M6281 Muscle weakness (generalized): Secondary | ICD-10-CM

## 2023-12-20 DIAGNOSIS — Z7409 Other reduced mobility: Secondary | ICD-10-CM | POA: Diagnosis not present

## 2023-12-20 DIAGNOSIS — R2689 Other abnormalities of gait and mobility: Secondary | ICD-10-CM | POA: Diagnosis not present

## 2023-12-20 DIAGNOSIS — R2681 Unsteadiness on feet: Secondary | ICD-10-CM

## 2023-12-20 DIAGNOSIS — R293 Abnormal posture: Secondary | ICD-10-CM
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
## 2023-12-26 ENCOUNTER — Encounter: Payer: Medicare Other | Admitting: Physical Therapy

## 2024-01-02 ENCOUNTER — Encounter: Payer: Self-pay | Admitting: Physical Therapy

## 2024-01-02 ENCOUNTER — Ambulatory Visit (INDEPENDENT_AMBULATORY_CARE_PROVIDER_SITE_OTHER): Payer: Medicare Other | Admitting: Physical Therapy

## 2024-01-02 DIAGNOSIS — R2681 Unsteadiness on feet: Secondary | ICD-10-CM

## 2024-01-02 DIAGNOSIS — R293 Abnormal posture: Secondary | ICD-10-CM

## 2024-01-02 DIAGNOSIS — R2689 Other abnormalities of gait and mobility: Secondary | ICD-10-CM | POA: Diagnosis not present

## 2024-01-02 DIAGNOSIS — Z7409 Other reduced mobility: Secondary | ICD-10-CM | POA: Diagnosis not present

## 2024-01-02 DIAGNOSIS — M6281 Muscle weakness (generalized): Secondary | ICD-10-CM | POA: Diagnosis not present
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
## 2024-01-09 ENCOUNTER — Encounter: Payer: Medicare Other | Admitting: Physical Therapy

## 2024-01-16 ENCOUNTER — Ambulatory Visit (INDEPENDENT_AMBULATORY_CARE_PROVIDER_SITE_OTHER): Payer: Medicare Other | Admitting: Physical Therapy

## 2024-01-16 ENCOUNTER — Encounter: Payer: Self-pay | Admitting: Physical Therapy

## 2024-01-16 DIAGNOSIS — R293 Abnormal posture: Secondary | ICD-10-CM

## 2024-01-16 DIAGNOSIS — M6281 Muscle weakness (generalized): Secondary | ICD-10-CM

## 2024-01-16 DIAGNOSIS — R2689 Other abnormalities of gait and mobility: Secondary | ICD-10-CM

## 2024-01-16 DIAGNOSIS — R2681 Unsteadiness on feet: Secondary | ICD-10-CM | POA: Diagnosis not present

## 2024-01-16 DIAGNOSIS — Z7409 Other reduced mobility: Secondary | ICD-10-CM | POA: Diagnosis not present
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
## 2024-01-19 NOTE — Progress Notes (Unsigned)
 Subjective:    Patient ID: [name], male    DOB: Feb 12, 1961, 63 y.o.   MRN: [id number]     HPI [name] is here for follow up of his chronic medical problems.  Send Rybelsus last fall-never refilled or attempted to increase-he does not recall ever taking this.  Alcohol - drinking a couple of glasses of wine 2-3 days a week.   ?  OSA-has not pursued finding alternative treatments-not currently using CPAP  Has Left leg prosthesis.  PT once a week.     Does not feel very motivated and does feel depressed.  He does not feel like doing much and sleeps a lot, sits around a lot.  He has gained weight.  Medications and allergies reviewed with patient and updated if appropriate.  Current Outpatient Medications on File Prior to Visit  Medication Sig Dispense Refill   acamprosate (CAMPRAL) 333 MG tablet Take 1 tablet (333 mg total) by mouth 2 (two) times daily. 180 tablet 1   acetaminophen (TYLENOL) 325 MG tablet Take 2 tablets (650 mg total) by mouth every 6 (six) hours as needed for mild pain (or Fever >/= 101).     amLODipine (NORVASC) 2.5 MG tablet Take 1 tablet (2.5 mg total) by mouth daily. 30 tablet 0   ascorbic acid (VITAMIN C) 1000 MG tablet Take 1 tablet (1,000 mg total) by mouth daily. 100 tablet 0   atorvastatin (LIPITOR) 80 MG tablet TAKE 1 TABLET BY MOUTH EVERY DAY 90 tablet 1   cholecalciferol (VITAMIN D3) 25 MCG (1000 UNIT) tablet Take 2 tablets (2,000 Units total) by mouth daily. 180 tablet 0   DULoxetine (CYMBALTA) 30 MG capsule Take 1 capsule (30 mg total) by mouth daily. 90 capsule 1   furosemide (LASIX) 40 MG tablet Take 1 tablet (40 mg total) by mouth daily as needed. 90 tablet 1   gabapentin (NEURONTIN) 300 MG capsule Take 2 capsules (600 mg total) by mouth 3 (three) times daily. 180 capsule 5   Magnesium 400 MG TABS Take 400 mg by mouth daily. 90 tablet 1   metoprolol succinate (TOPROL-XL) 25 MG 24 hr tablet TAKE 1 TABLET BY MOUTH EVERY DAY 90 tablet 1    nitroGLYCERIN (NITROSTAT) 0.4 MG SL tablet Place 1 tablet (0.4 mg total) under the tongue every 5 (five) minutes x 3 doses as needed for chest pain. 15 tablet 1   Semaglutide (RYBELSUS) 3 MG TABS Take 1 tablet (3 mg total) by mouth daily. 30 tablet 0   sertraline (ZOLOFT) 50 MG tablet Take 1 tablet (50 mg total) by mouth daily. 90 tablet 1   thiamine (VITAMIN B1) 100 MG tablet Take 1 tablet (100 mg total) by mouth daily. 100 tablet 1   vitamin B-12 (CYANOCOBALAMIN) 100 MCG tablet TAKE 1 TABLET BY MOUTH EVERY DAY 100 tablet 1   No current facility-administered medications on file prior to visit.     Review of Systems  Constitutional:  Negative for fever.  Respiratory:  Positive for shortness of breath (DOE - out of shape, weight gain). Negative for cough and wheezing.   Cardiovascular:  Negative for chest pain, palpitations and leg swelling.  Gastrointestinal:  Positive for diarrhea (occ). Negative for abdominal pain.  Musculoskeletal:  Positive for back pain.  Neurological:  Positive for numbness (tingling in right foot at night - no pain). Negative for light-headedness and headaches.  Psychiatric/Behavioral:  Positive for dysphoric mood (sleeping more, decreased motivation).  Objective:   Vitals:   01/20/24 1023  BP: 136/82  Pulse: (!) 110  Temp: 98.4 F (36.9 C)   BP Readings from Last 3 Encounters:  01/20/24 136/82  07/22/23 134/80  04/21/23 128/65   Wt Readings from Last 3 Encounters:  01/20/24 294 lb (133.4 kg)  04/21/23 264 lb 5.3 oz (119.9 kg)  04/05/23 273 lb 9.5 oz (124.1 kg)   Body mass index is 39.87 kg/m.    Physical Exam Constitutional:      General: He is not in acute distress.    Appearance: Normal appearance. He is not ill-appearing.  HENT:     Head: Normocephalic and atraumatic.  Eyes:     Conjunctiva/sclera: Conjunctivae normal.  Cardiovascular:     Rate and Rhythm: Normal rate and regular rhythm.     Heart sounds: Normal heart sounds.   Pulmonary:     Effort: Pulmonary effort is normal. No respiratory distress.     Breath sounds: Normal breath sounds. No wheezing or rales.  Musculoskeletal:     Right lower leg: Edema (Mild with some mild chronic skin changes-slight thickening and erythema) present.  Skin:    General: Skin is warm and dry.     Findings: No rash.  Neurological:     Mental Status: He is alert. Mental status is at baseline.  Psychiatric:     Comments: Depressed affect        Lab Results  Component Value Date   WBC 4.1 07/22/2023   HGB 12.2 (L) 07/22/2023   HCT 37.7 (L) 07/22/2023   PLT 177.0 07/22/2023   GLUCOSE 144 (H) 07/22/2023   CHOL 126 07/22/2023   TRIG 87.0 07/22/2023   HDL 50.40 07/22/2023   LDLCALC 58 07/22/2023   ALT 29 07/22/2023   AST 30 07/22/2023   NA 141 07/22/2023   K 3.7 07/22/2023   CL 102 07/22/2023   CREATININE 0.71 07/22/2023   BUN 21 07/22/2023   CO2 28 07/22/2023   TSH 1.63 07/22/2023   PSA 0.86 07/22/2023   INR 1.2 04/01/2023   HGBA1C 5.9 07/22/2023   MICROALBUR 1.2 07/22/2023     Assessment & Plan:    See Problem List for Assessment and Plan of chronic medical problems.

## 2024-01-19 NOTE — Patient Instructions (Addendum)
      Blood work was ordered.       Medications changes include :   stop duloxetine.   Restart campral.   Increase sertraline to 100 mg daily.  Starte mounjaro if covered - injection once a week.    A referral was ordered [facility] for your back pain and someone will call you to schedule an appointment.     Return in about 3 months (around 04/20/2024) for follow up.

## 2024-01-20 ENCOUNTER — Encounter: Payer: Self-pay | Admitting: Internal Medicine

## 2024-01-20 ENCOUNTER — Other Ambulatory Visit: Payer: Self-pay | Admitting: Internal Medicine

## 2024-01-20 ENCOUNTER — Ambulatory Visit (INDEPENDENT_AMBULATORY_CARE_PROVIDER_SITE_OTHER): Payer: Medicare Other | Admitting: Internal Medicine

## 2024-01-20 VITALS — BP 136/82 | HR 110 | Temp 98.4°F | Ht 72.0 in | Wt 294.0 lb

## 2024-01-20 DIAGNOSIS — I5042 Chronic combined systolic (congestive) and diastolic (congestive) heart failure: Secondary | ICD-10-CM | POA: Diagnosis not present

## 2024-01-20 DIAGNOSIS — I10 Essential (primary) hypertension: Secondary | ICD-10-CM

## 2024-01-20 DIAGNOSIS — G8929 Other chronic pain: Secondary | ICD-10-CM

## 2024-01-20 DIAGNOSIS — K7030 Alcoholic cirrhosis of liver without ascites: Secondary | ICD-10-CM

## 2024-01-20 DIAGNOSIS — E039 Hypothyroidism, unspecified: Secondary | ICD-10-CM

## 2024-01-20 DIAGNOSIS — F3289 Other specified depressive episodes: Secondary | ICD-10-CM

## 2024-01-20 DIAGNOSIS — M545 Low back pain, unspecified: Secondary | ICD-10-CM

## 2024-01-20 DIAGNOSIS — F1010 Alcohol abuse, uncomplicated: Secondary | ICD-10-CM

## 2024-01-20 DIAGNOSIS — G621 Alcoholic polyneuropathy: Secondary | ICD-10-CM

## 2024-01-20 DIAGNOSIS — E782 Mixed hyperlipidemia: Secondary | ICD-10-CM

## 2024-01-20 DIAGNOSIS — G4733 Obstructive sleep apnea (adult) (pediatric): Secondary | ICD-10-CM

## 2024-01-20 DIAGNOSIS — E1151 Type 2 diabetes mellitus with diabetic peripheral angiopathy without gangrene: Secondary | ICD-10-CM

## 2024-01-20 LAB — CBC WITH DIFFERENTIAL/PLATELET
Basophils Absolute: 0 10*3/uL (ref 0.0–0.1)
Basophils Relative: 0.5 % (ref 0.0–3.0)
Eosinophils Absolute: 0.1 10*3/uL (ref 0.0–0.7)
Eosinophils Relative: 1.2 % (ref 0.0–5.0)
HCT: 41.6 % (ref 39.0–52.0)
Hemoglobin: 14.2 g/dL (ref 13.0–17.0)
Lymphocytes Relative: 33 % (ref 12.0–46.0)
Lymphs Abs: 1.6 10*3/uL (ref 0.7–4.0)
MCHC: 34.2 g/dL (ref 30.0–36.0)
MCV: 101 fl — ABNORMAL HIGH (ref 78.0–100.0)
Monocytes Absolute: 0.4 10*3/uL (ref 0.1–1.0)
Monocytes Relative: 7.9 % (ref 3.0–12.0)
Neutro Abs: 2.8 10*3/uL (ref 1.4–7.7)
Neutrophils Relative %: 57.4 % (ref 43.0–77.0)
Platelets: 202 10*3/uL (ref 150.0–400.0)
RBC: 4.12 Mil/uL — ABNORMAL LOW (ref 4.22–5.81)
RDW: 14.5 % (ref 11.5–15.5)
WBC: 4.9 10*3/uL (ref 4.0–10.5)

## 2024-01-20 LAB — LIPID PANEL
Cholesterol: 120 mg/dL (ref 0–200)
HDL: 43.3 mg/dL (ref >39.00)
LDL Cholesterol: 44 mg/dL (ref 0–99)
NonHDL: 77
Total CHOL/HDL Ratio: 3
Triglycerides: 164 mg/dL — ABNORMAL HIGH (ref 0.0–149.0)
VLDL: 32.8 mg/dL (ref 0.0–40.0)

## 2024-01-20 LAB — COMPREHENSIVE METABOLIC PANEL WITH GFR
ALT: 38 U/L (ref 0–53)
AST: 39 U/L — ABNORMAL HIGH (ref 0–37)
Albumin: 4.4 g/dL (ref 3.5–5.2)
Alkaline Phosphatase: 124 U/L — ABNORMAL HIGH (ref 39–117)
BUN: 17 mg/dL (ref 6–23)
CO2: 21 meq/L (ref 19–32)
Calcium: 9.5 mg/dL (ref 8.4–10.5)
Chloride: 103 meq/L (ref 96–112)
Creatinine, Ser: 0.74 mg/dL (ref 0.40–1.50)
GFR: 97.12 mL/min (ref >60.00)
Glucose, Bld: 133 mg/dL — ABNORMAL HIGH (ref 70–99)
Potassium: 3.9 meq/L (ref 3.5–5.1)
Sodium: 138 meq/L (ref 135–145)
Total Bilirubin: 0.8 mg/dL (ref 0.2–1.2)
Total Protein: 7.8 g/dL (ref 6.0–8.3)

## 2024-01-20 LAB — HEMOGLOBIN A1C: Hgb A1c MFr Bld: 5.3 % (ref 4.6–6.5)

## 2024-01-20 LAB — TSH: TSH: 1.86 u[IU]/mL (ref 0.35–5.50)

## 2024-01-20 MED ORDER — ATORVASTATIN CALCIUM 80 MG PO TABS: 80.0000 mg | ORAL_TABLET | Freq: Every day | ORAL | 1 refills | Status: DC

## 2024-01-20 MED ORDER — SERTRALINE HCL 100 MG PO TABS: 100.0000 mg | ORAL_TABLET | Freq: Every day | ORAL | 1 refills | Status: DC

## 2024-01-20 MED ORDER — TIRZEPATIDE 2.5 MG/0.5ML ~~LOC~~ SOAJ: 2.5000 mg | SUBCUTANEOUS | 0 refills | Status: AC

## 2024-01-20 MED ORDER — ACAMPROSATE CALCIUM 333 MG PO TBEC: 333.0000 mg | DELAYED_RELEASE_TABLET | Freq: Two times a day (BID) | ORAL | 1 refills | Status: AC

## 2024-01-20 NOTE — Assessment & Plan Note (Signed)
 Chronic  Clinically euthyroid Check tsh  Currently not on any medication, but was on medication previously

## 2024-01-20 NOTE — Assessment & Plan Note (Signed)
 Chronic Confirmed by [facility] in nature Continue B12, thiamine supplementation Currently on gabapentin 600 mg 3 times daily

## 2024-01-20 NOTE — Assessment & Plan Note (Addendum)
 Chronic Lab Results  Component Value Date   HGBA1C 5.9 07/22/2023   Stressed low sugar/carbohydrate diet Stressed being as active as possible Check A1c Not currently on any medication-I do think he would benefit from Mounjaro 2.5 mg weekly-sent to pharmacy Follow-up in 3 months

## 2024-01-20 NOTE — Assessment & Plan Note (Signed)
 Chronic lower back pain Has seen orthopedics in the past and an injection did help a while Referral to orthopedics for reevaluation and further treatment

## 2024-01-20 NOTE — Assessment & Plan Note (Signed)
 Chronic Blood pressure controlled Continue current medication-amlodipine 2.5 mg daily, metoprolol XL 25 mg daily CBC, CMP

## 2024-01-20 NOTE — Assessment & Plan Note (Signed)
 Intolerant of cpap Not using anything Stressed the importance of treating sleep apnea

## 2024-01-20 NOTE — Assessment & Plan Note (Signed)
Chronic Regular exercise and healthy diet encouraged Check lipid panel, CMP Continue atorvastatin 80 mg daily

## 2024-01-20 NOTE — Assessment & Plan Note (Signed)
Chronic Euvolemic Continue Lasix 40 mg daily as needed-has not taken in a while

## 2024-01-20 NOTE — Assessment & Plan Note (Addendum)
 Chronic Not ideally controlled Increase sertraline to 100 mg daily Should not be taking duloxetine at the same time-discontinue

## 2024-01-20 NOTE — Assessment & Plan Note (Addendum)
 Chronic He is drinking alcohol few days a week-2 glasses of wine Stressed that he needs to be abstinent of all alcohol Restart Campral CMP,cbc

## 2024-01-20 NOTE — Assessment & Plan Note (Addendum)
 Chronic Ran out of Campral and has not taken this for a while He is drinking a few days a week-2 glasses of wine He does want to stop and knows he needs to stop Stressed complete cessation secondary to liver cirrhosis and further alcohol causing further damage Will restart Campral

## 2024-01-22 ENCOUNTER — Encounter: Payer: Self-pay | Admitting: Internal Medicine

## 2024-01-23 ENCOUNTER — Encounter: Payer: Self-pay | Admitting: Internal Medicine

## 2024-01-23 DIAGNOSIS — F3289 Other specified depressive episodes: Secondary | ICD-10-CM

## 2024-01-24 ENCOUNTER — Telehealth: Payer: Self-pay

## 2024-01-24 NOTE — Telephone Encounter (Signed)
 Pharmacy Patient Advocate Encounter   Received notification from CoverMyMeds that prior authorization for Mounjaro 2.5MG /0.5ML auto-injectors is required/requested.   Insurance verification completed.   The patient is insured through  [facility]  .   Per test claim: PA required; PA submitted to above mentioned insurance via CoverMyMeds Key/confirmation #/EOC [id number] Status is pending

## 2024-01-25 ENCOUNTER — Other Ambulatory Visit: Payer: Self-pay | Admitting: Internal Medicine

## 2024-01-25 ENCOUNTER — Other Ambulatory Visit (HOSPITAL_COMMUNITY): Payer: Self-pay

## 2024-01-25 NOTE — Telephone Encounter (Signed)
 Pharmacy Patient Advocate Encounter  Received notification from [facility] that Prior Authorization for Mounjaro 2.5mg /0.29ml has been APPROVED from 01/24/24 to 01/23/25. Unable to obtain price due to refill too soon rejection, last fill date 01/24/24 next available fill date 02/15/24

## 2024-01-26 MED ORDER — SERTRALINE HCL 100 MG PO TABS: 150.0000 mg | ORAL_TABLET | Freq: Every day | ORAL | Status: DC

## 2024-01-26 NOTE — Telephone Encounter (Signed)
Please see the MyChart message reply(ies) for my assessment and plan.   The patient gave consent for this Medical Advice Message and is aware that it may result in a bill to their insurance company as well as the possibility that this may result in a co-payment or deductible. They are an established patient, but are not seeking medical advice exclusively about a problem treated during an in person or video visit in the last 7 days. I did not recommend an in person or video visit within 7 days of my reply.   I spent a total of 5 minutes cumulative time within 7 days through MyChart messaging  Yaretzi Ernandez J Wenceslaus Gist, MD   

## 2024-02-22 ENCOUNTER — Other Ambulatory Visit: Payer: Self-pay | Admitting: Internal Medicine

## 2024-02-23 NOTE — Telephone Encounter (Signed)
 error

## 2024-02-27 ENCOUNTER — Other Ambulatory Visit: Payer: Self-pay | Admitting: Internal Medicine

## 2024-04-17 ENCOUNTER — Ambulatory Visit: Admitting: Internal Medicine

## 2024-06-14 ENCOUNTER — Ambulatory Visit (INDEPENDENT_AMBULATORY_CARE_PROVIDER_SITE_OTHER)

## 2024-06-14 VITALS — Ht 72.0 in | Wt 294.0 lb

## 2024-06-14 DIAGNOSIS — Z89512 Acquired absence of left leg below knee: Secondary | ICD-10-CM | POA: Diagnosis not present

## 2024-06-14 DIAGNOSIS — Z0000 Encounter for general adult medical examination without abnormal findings: Secondary | ICD-10-CM | POA: Diagnosis not present

## 2024-06-14 DIAGNOSIS — E1151 Type 2 diabetes mellitus with diabetic peripheral angiopathy without gangrene: Secondary | ICD-10-CM | POA: Diagnosis not present

## 2024-06-14 DIAGNOSIS — Z1211 Encounter for screening for malignant neoplasm of colon: Secondary | ICD-10-CM | POA: Diagnosis not present

## 2024-06-14 DIAGNOSIS — R931 Abnormal findings on diagnostic imaging of heart and coronary circulation: Secondary | ICD-10-CM

## 2024-06-14 DIAGNOSIS — F1010 Alcohol abuse, uncomplicated: Secondary | ICD-10-CM
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
## 2024-06-14 NOTE — Patient Instructions (Addendum)
 [name] , Thank you for taking time out of your busy schedule to complete your Annual Wellness Visit with me. I enjoyed our conversation and look forward to speaking with you again next year. I, as well as your care team,  appreciate your ongoing commitment to your health goals. Please review the following plan we discussed and let me know if I can assist you in the future. Your Game plan/ To Do List    Referrals: If you haven't heard from the office you've been referred to, please reach out to them at the phone provided. [facility], [address] [place] [address], [place], [place] [address] Phone: [phone].  Follow up Visits: We will see or speak with you next year for your Next Medicare AWV with our clinical staff Have you seen your provider in the last 6 months (3 months if uncontrolled diabetes)? Yes.  Last office visit on 01/20/2024.  Clinician Recommendations:  Aim for 30 minutes of exercise or brisk walking, 6-8 glasses of water , and 5 servings of fruits and vegetables each day. You are due for a pneumonia vaccine, a Hep B vaccine and a kidney evaluation and can get that done here during your next office visit.  Remember to call and get scheduled for a 6 month follow up near October 2025.   You are also due for a diabetic eye exam.  Please call your eye doctor to get scheduled to get your eyes checked.       This is a list of the screenings recommended for you:  Health Maintenance  Topic Date Due   Eye exam for diabetics  Never done   Yearly kidney health urinalysis for diabetes  Never done   Zoster (Shingles) Vaccine (1 of 2) Never done   Pneumococcal Vaccine for age over 34 (2 of 2 - PCV) 05/03/2020   Hepatitis B Vaccine (1 of 3 - Risk 3-dose series) Never done   Colon Cancer Screening  10/18/2021   COVID-19 Vaccine (5 - 2024-25 season) 06/19/2023   Flu Shot  05/18/2024   Complete foot exam   07/21/2024   Hemoglobin A1C  07/21/2024   Yearly kidney function  blood test for diabetes  01/19/2025   Medicare Annual Wellness Visit  06/14/2025   DTaP/Tdap/Td vaccine (2 - Td or Tdap) 11/23/2026   Hepatitis C Screening  Completed   HIV Screening  Completed   HPV Vaccine  Aged Out   Meningitis B Vaccine  Aged Out    Advanced directives: (Copy Requested) Please bring a copy of your health care power of attorney and living will to the office to be added to your chart at your convenience. You can mail to [facility] [address] [place], [place] [address] or email to [email or web address] Advance Care Planning is important because it:  [x]  Makes sure you receive the medical care that is consistent with your values, goals, and preferences  [x]  It provides guidance to your family and loved ones and reduces their decisional burden about whether or not they are making the right decisions based on your wishes.  Follow the link provided in your after visit summary or read over the paperwork we have mailed to you to help you started getting your Advance Directives in place. If you need assistance in completing these, please reach out to us  so that we can help you!  See attachments for Preventive Care and Fall Prevention Tips.

## 2024-06-19 ENCOUNTER — Encounter: Payer: Self-pay | Admitting: Internal Medicine

## 2024-06-19 ENCOUNTER — Telehealth: Payer: Self-pay | Admitting: *Deleted

## 2024-06-19 ENCOUNTER — Other Ambulatory Visit: Payer: Self-pay

## 2024-06-19 MED ORDER — GABAPENTIN 300 MG PO CAPS: 600.0000 mg | ORAL_CAPSULE | Freq: Three times a day (TID) | ORAL | 1 refills | Status: DC

## 2024-06-19 NOTE — Progress Notes (Unsigned)
 Complex Care Management Note Care Guide Note  06/19/2024 Name: [name] MRN: [id number] DOB: 11-28-1960   Complex Care Management Outreach Attempts: An unsuccessful telephone outreach was attempted today to offer the patient information about available complex care management services.  Follow Up Plan:  Additional outreach attempts will be made to offer the patient complex care management information and services.   Encounter Outcome:  No Answer  [name], CMA [place]  [facility], [facility] Guide Direct Dial: [phone]  Fax: [phone] Website: [email or web address]

## 2024-06-20 NOTE — Progress Notes (Unsigned)
 Complex Care Management Note Care Guide Note  06/20/2024 Name: [name] MRN: [id number] DOB: 10/15/1961   Complex Care Management Outreach Attempts: A second unsuccessful outreach was attempted today to offer the patient with information about available complex care management services.  Follow Up Plan:  Additional outreach attempts will be made to offer the patient complex care management information and services.   Encounter Outcome:  No Answer  [name], CMA [place]  [facility], [facility] Guide Direct Dial: [phone]  Fax: [phone] Website: [email or web address]

## 2024-06-21 NOTE — Progress Notes (Signed)
 Complex Care Management Note Care Guide Note  06/21/2024 Name: [name] MRN: [id number] DOB: August 13, 1961   Complex Care Management Outreach Attempts: A third unsuccessful outreach was attempted today to offer the patient with information about available complex care management services.  Follow Up Plan:  No further outreach attempts will be made at this time. We have been unable to contact the patient to offer or enroll patient in complex care management services.  Encounter Outcome:  No Answer  [name], CMA, Care Guide [facility]  [facility], [facility] Guide Direct Dial: [phone]  Fax: [phone] Website: [email or web address]

## 2024-07-13 ENCOUNTER — Other Ambulatory Visit: Payer: Self-pay | Admitting: Internal Medicine

## 2024-08-18 ENCOUNTER — Other Ambulatory Visit: Payer: Self-pay | Admitting: Internal Medicine

## 2024-08-20 ENCOUNTER — Encounter: Payer: Self-pay | Admitting: Radiology

## 2024-08-21 ENCOUNTER — Ambulatory Visit: Admitting: Orthopedic Surgery

## 2024-08-27 ENCOUNTER — Encounter: Payer: Self-pay | Admitting: Physician Assistant

## 2024-08-27 ENCOUNTER — Ambulatory Visit: Admitting: Physician Assistant

## 2024-08-27 DIAGNOSIS — Z89512 Acquired absence of left leg below knee: Secondary | ICD-10-CM | POA: Diagnosis not present
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
## 2024-09-10 ENCOUNTER — Other Ambulatory Visit: Payer: Self-pay

## 2024-09-10 ENCOUNTER — Encounter: Payer: Self-pay | Admitting: Internal Medicine

## 2024-09-12 ENCOUNTER — Other Ambulatory Visit: Payer: Self-pay

## 2024-09-12 ENCOUNTER — Other Ambulatory Visit: Payer: Self-pay | Admitting: Internal Medicine

## 2024-09-12 MED ORDER — FUROSEMIDE 40 MG PO TABS: 40.0000 mg | ORAL_TABLET | Freq: Every day | ORAL | 0 refills | Status: DC | PRN

## 2024-09-12 NOTE — Telephone Encounter (Signed)
 Patient is completely out and would like medication filled to carry him through the holiday. He will call back next week to make appt

## 2024-09-12 NOTE — Telephone Encounter (Signed)
 Copied from CRM [id number]. Topic: Clinical - Medication Refill >> Sep 12, 2024 10:20 AM Viola F wrote: Medication: furosemide  (LASIX ) 40 MG tablet [[id number]]  Has the patient contacted their pharmacy? Yes (Agent: If no, request that the patient contact the pharmacy for the refill. If patient does not wish to contact the pharmacy document the reason why and proceed with request.) (Agent: If yes, when and what did the pharmacy advise?)  This is the patient's preferred pharmacy:   [id number] [address] [facility] - [address] [place] [address] Phone: [phone] Fax: [phone]  Is this the correct pharmacy for this prescription? Yes If no, delete pharmacy and type the correct one.   Has the prescription been filled recently? Yes  Is the patient out of the medication? Yes  Has the patient been seen for an appointment in the last year OR does the patient have an upcoming appointment? Yes  Can we respond through MyChart? Yes  Agent: Please be advised that Rx refills may take up to 3 business days. We ask that you follow-up with your pharmacy.

## 2024-09-17 ENCOUNTER — Encounter: Payer: Self-pay | Admitting: Internal Medicine

## 2024-10-04 ENCOUNTER — Other Ambulatory Visit: Payer: Self-pay | Admitting: Internal Medicine

## 2024-10-10 ENCOUNTER — Other Ambulatory Visit: Payer: Self-pay | Admitting: Internal Medicine

## 2024-11-16 ENCOUNTER — Other Ambulatory Visit: Payer: Self-pay | Admitting: Internal Medicine

## 2024-11-17 ENCOUNTER — Other Ambulatory Visit: Payer: Self-pay | Admitting: Internal Medicine
